# Patient Record
Sex: Male | Born: 1935 | Race: White | Hispanic: No | Marital: Married | Smoking: Never smoker
Health system: Southern US, Community
[De-identification: ages and names within clinical notes are randomized; demographics above are authoritative.]

## PROBLEM LIST (undated history)

## (undated) DIAGNOSIS — C49A0 Gastrointestinal stromal tumor, unspecified site: Secondary | ICD-10-CM

## (undated) DIAGNOSIS — I10 Essential (primary) hypertension: Secondary | ICD-10-CM

## (undated) DIAGNOSIS — R0600 Dyspnea, unspecified: Secondary | ICD-10-CM

## (undated) DIAGNOSIS — N138 Other obstructive and reflux uropathy: Secondary | ICD-10-CM

## (undated) DIAGNOSIS — S98132A Complete traumatic amputation of one left lesser toe, initial encounter: Secondary | ICD-10-CM

## (undated) DIAGNOSIS — N210 Calculus in bladder: Secondary | ICD-10-CM

## (undated) DIAGNOSIS — I739 Peripheral vascular disease, unspecified: Secondary | ICD-10-CM

## (undated) DIAGNOSIS — C8590 Non-Hodgkin lymphoma, unspecified, unspecified site: Principal | ICD-10-CM

## (undated) DIAGNOSIS — I4891 Unspecified atrial fibrillation: Secondary | ICD-10-CM

## (undated) DIAGNOSIS — K219 Gastro-esophageal reflux disease without esophagitis: Secondary | ICD-10-CM

## (undated) DIAGNOSIS — I499 Cardiac arrhythmia, unspecified: Secondary | ICD-10-CM

## (undated) DIAGNOSIS — H269 Unspecified cataract: Secondary | ICD-10-CM

## (undated) DIAGNOSIS — C4490 Unspecified malignant neoplasm of skin, unspecified: Secondary | ICD-10-CM

## (undated) DIAGNOSIS — M1990 Unspecified osteoarthritis, unspecified site: Secondary | ICD-10-CM

## (undated) DIAGNOSIS — Z800 Family history of malignant neoplasm of digestive organs: Secondary | ICD-10-CM

## (undated) DIAGNOSIS — Z803 Family history of malignant neoplasm of breast: Secondary | ICD-10-CM

## (undated) DIAGNOSIS — E119 Type 2 diabetes mellitus without complications: Secondary | ICD-10-CM

## (undated) DIAGNOSIS — N401 Enlarged prostate with lower urinary tract symptoms: Secondary | ICD-10-CM

## (undated) DIAGNOSIS — J209 Acute bronchitis, unspecified: Secondary | ICD-10-CM

## (undated) HISTORY — PX: CHOLECYSTECTOMY: SHX55

## (undated) HISTORY — DX: Non-Hodgkin lymphoma, unspecified, unspecified site: C85.90

## (undated) HISTORY — DX: Unspecified atrial fibrillation: I48.91

## (undated) HISTORY — DX: Complete traumatic amputation of one left lesser toe, initial encounter: S98.132A

## (undated) HISTORY — PX: INCISION AND DRAINAGE PERIRECTAL ABSCESS: SHX1804

## (undated) HISTORY — DX: Other obstructive and reflux uropathy: N40.1

## (undated) HISTORY — DX: Gastrointestinal stromal tumor, unspecified site: C49.A0

## (undated) HISTORY — DX: Type 2 diabetes mellitus without complications: E11.9

## (undated) HISTORY — PX: LYMPH NODE DISSECTION: SHX5087

## (undated) HISTORY — DX: Family history of malignant neoplasm of breast: Z80.3

## (undated) HISTORY — DX: Other obstructive and reflux uropathy: N13.8

## (undated) HISTORY — PX: COLON RESECTION: SHX5231

## (undated) HISTORY — PX: PORTACATH PLACEMENT: SHX2246

## (undated) HISTORY — PX: PORT-A-CATH REMOVAL: SHX5289

## (undated) HISTORY — DX: Family history of malignant neoplasm of digestive organs: Z80.0

---

## 1998-04-20 DIAGNOSIS — R1310 Dysphagia, unspecified: Secondary | ICD-10-CM | POA: Insufficient documentation

## 2001-04-01 ENCOUNTER — Ambulatory Visit (HOSPITAL_COMMUNITY): Admission: RE | Admit: 2001-04-01 | Discharge: 2001-04-01 | Payer: Self-pay | Admitting: Pulmonary Disease

## 2002-01-11 ENCOUNTER — Ambulatory Visit (HOSPITAL_COMMUNITY): Admission: RE | Admit: 2002-01-11 | Discharge: 2002-01-11 | Payer: Self-pay | Admitting: Internal Medicine

## 2002-03-27 ENCOUNTER — Ambulatory Visit (HOSPITAL_COMMUNITY): Admission: RE | Admit: 2002-03-27 | Discharge: 2002-03-27 | Payer: Self-pay | Admitting: Pulmonary Disease

## 2002-12-28 ENCOUNTER — Other Ambulatory Visit: Admission: RE | Admit: 2002-12-28 | Discharge: 2002-12-28 | Payer: Self-pay | Admitting: Dermatology

## 2003-02-12 ENCOUNTER — Encounter: Payer: Self-pay | Admitting: Internal Medicine

## 2003-02-12 ENCOUNTER — Ambulatory Visit (HOSPITAL_COMMUNITY): Admission: RE | Admit: 2003-02-12 | Discharge: 2003-02-12 | Payer: Self-pay | Admitting: Internal Medicine

## 2003-03-30 ENCOUNTER — Ambulatory Visit (HOSPITAL_COMMUNITY): Admission: RE | Admit: 2003-03-30 | Discharge: 2003-03-30 | Payer: Self-pay | Admitting: Internal Medicine

## 2003-04-04 ENCOUNTER — Ambulatory Visit (HOSPITAL_COMMUNITY): Admission: RE | Admit: 2003-04-04 | Discharge: 2003-04-04 | Payer: Self-pay | Admitting: Internal Medicine

## 2003-04-21 DIAGNOSIS — C49A0 Gastrointestinal stromal tumor, unspecified site: Secondary | ICD-10-CM

## 2003-04-21 DIAGNOSIS — C8590 Non-Hodgkin lymphoma, unspecified, unspecified site: Secondary | ICD-10-CM

## 2003-04-21 HISTORY — DX: Gastrointestinal stromal tumor, unspecified site: C49.A0

## 2003-04-21 HISTORY — DX: Non-Hodgkin lymphoma, unspecified, unspecified site: C85.90

## 2003-08-03 ENCOUNTER — Ambulatory Visit (HOSPITAL_COMMUNITY): Admission: RE | Admit: 2003-08-03 | Discharge: 2003-08-03 | Payer: Self-pay | Admitting: General Surgery

## 2003-08-21 ENCOUNTER — Encounter (HOSPITAL_COMMUNITY): Admission: RE | Admit: 2003-08-21 | Discharge: 2003-09-20 | Payer: Self-pay | Admitting: Oncology

## 2003-08-21 ENCOUNTER — Encounter: Admission: RE | Admit: 2003-08-21 | Discharge: 2003-08-21 | Payer: Self-pay | Admitting: Oncology

## 2003-08-27 ENCOUNTER — Ambulatory Visit (HOSPITAL_COMMUNITY): Admission: RE | Admit: 2003-08-27 | Discharge: 2003-08-27 | Payer: Self-pay | Admitting: Oncology

## 2003-09-06 ENCOUNTER — Encounter: Admission: RE | Admit: 2003-09-06 | Discharge: 2003-09-06 | Payer: Self-pay | Admitting: Surgery

## 2003-09-10 ENCOUNTER — Ambulatory Visit (HOSPITAL_BASED_OUTPATIENT_CLINIC_OR_DEPARTMENT_OTHER): Admission: RE | Admit: 2003-09-10 | Discharge: 2003-09-10 | Payer: Self-pay | Admitting: Surgery

## 2003-09-10 ENCOUNTER — Ambulatory Visit (HOSPITAL_COMMUNITY): Admission: RE | Admit: 2003-09-10 | Discharge: 2003-09-10 | Payer: Self-pay | Admitting: Surgery

## 2003-09-24 ENCOUNTER — Encounter (HOSPITAL_COMMUNITY): Admission: RE | Admit: 2003-09-24 | Discharge: 2003-10-24 | Payer: Self-pay | Admitting: Oncology

## 2003-09-24 ENCOUNTER — Encounter: Admission: RE | Admit: 2003-09-24 | Discharge: 2003-09-24 | Payer: Self-pay | Admitting: Oncology

## 2003-10-29 ENCOUNTER — Encounter: Admission: RE | Admit: 2003-10-29 | Discharge: 2003-10-29 | Payer: Self-pay | Admitting: Oncology

## 2003-10-29 ENCOUNTER — Encounter (HOSPITAL_COMMUNITY): Admission: RE | Admit: 2003-10-29 | Discharge: 2003-11-28 | Payer: Self-pay | Admitting: Oncology

## 2003-11-23 ENCOUNTER — Ambulatory Visit (HOSPITAL_COMMUNITY): Admission: RE | Admit: 2003-11-23 | Discharge: 2003-11-23 | Payer: Self-pay | Admitting: Oncology

## 2003-11-30 ENCOUNTER — Encounter (HOSPITAL_COMMUNITY): Admission: RE | Admit: 2003-11-30 | Discharge: 2003-12-30 | Payer: Self-pay | Admitting: Oncology

## 2003-11-30 ENCOUNTER — Encounter: Admission: RE | Admit: 2003-11-30 | Discharge: 2003-11-30 | Payer: Self-pay | Admitting: Oncology

## 2004-01-09 ENCOUNTER — Encounter (HOSPITAL_COMMUNITY): Admission: RE | Admit: 2004-01-09 | Discharge: 2004-01-18 | Payer: Self-pay | Admitting: Oncology

## 2004-01-09 ENCOUNTER — Encounter: Admission: RE | Admit: 2004-01-09 | Discharge: 2004-01-18 | Payer: Self-pay | Admitting: Oncology

## 2004-01-24 ENCOUNTER — Encounter: Admission: RE | Admit: 2004-01-24 | Discharge: 2004-01-24 | Payer: Self-pay | Admitting: Oncology

## 2004-01-24 ENCOUNTER — Encounter (HOSPITAL_COMMUNITY): Admission: RE | Admit: 2004-01-24 | Discharge: 2004-02-23 | Payer: Self-pay | Admitting: Oncology

## 2004-02-14 ENCOUNTER — Inpatient Hospital Stay (HOSPITAL_COMMUNITY): Admission: RE | Admit: 2004-02-14 | Discharge: 2004-02-18 | Payer: Self-pay | Admitting: Surgery

## 2004-02-14 ENCOUNTER — Encounter (INDEPENDENT_AMBULATORY_CARE_PROVIDER_SITE_OTHER): Payer: Self-pay | Admitting: Specialist

## 2004-03-10 ENCOUNTER — Ambulatory Visit (HOSPITAL_COMMUNITY): Payer: Self-pay | Admitting: Oncology

## 2004-03-10 ENCOUNTER — Encounter (HOSPITAL_COMMUNITY): Admission: RE | Admit: 2004-03-10 | Discharge: 2004-04-09 | Payer: Self-pay | Admitting: Oncology

## 2004-03-10 ENCOUNTER — Encounter: Admission: RE | Admit: 2004-03-10 | Discharge: 2004-03-10 | Payer: Self-pay | Admitting: Oncology

## 2004-06-23 ENCOUNTER — Encounter (HOSPITAL_COMMUNITY): Admission: RE | Admit: 2004-06-23 | Discharge: 2004-07-23 | Payer: Self-pay | Admitting: Oncology

## 2004-06-23 ENCOUNTER — Ambulatory Visit (HOSPITAL_COMMUNITY): Payer: Self-pay | Admitting: Oncology

## 2004-06-23 ENCOUNTER — Encounter: Admission: RE | Admit: 2004-06-23 | Discharge: 2004-06-23 | Payer: Self-pay | Admitting: Oncology

## 2004-07-21 ENCOUNTER — Ambulatory Visit (HOSPITAL_COMMUNITY): Admission: RE | Admit: 2004-07-21 | Discharge: 2004-07-21 | Payer: Self-pay | Admitting: Oncology

## 2004-08-04 ENCOUNTER — Encounter: Admission: RE | Admit: 2004-08-04 | Discharge: 2004-08-04 | Payer: Self-pay | Admitting: Oncology

## 2004-08-19 ENCOUNTER — Ambulatory Visit (HOSPITAL_COMMUNITY): Admission: RE | Admit: 2004-08-19 | Discharge: 2004-08-19 | Payer: Self-pay | Admitting: Internal Medicine

## 2004-09-01 ENCOUNTER — Ambulatory Visit (HOSPITAL_COMMUNITY): Payer: Self-pay | Admitting: Oncology

## 2004-10-13 ENCOUNTER — Encounter: Admission: RE | Admit: 2004-10-13 | Discharge: 2004-10-13 | Payer: Self-pay | Admitting: Oncology

## 2004-12-01 ENCOUNTER — Encounter: Admission: RE | Admit: 2004-12-01 | Discharge: 2004-12-01 | Payer: Self-pay | Admitting: Oncology

## 2004-12-01 ENCOUNTER — Ambulatory Visit (HOSPITAL_COMMUNITY): Payer: Self-pay | Admitting: Oncology

## 2005-01-19 ENCOUNTER — Ambulatory Visit (HOSPITAL_COMMUNITY): Payer: Self-pay | Admitting: Oncology

## 2005-01-19 ENCOUNTER — Encounter: Admission: RE | Admit: 2005-01-19 | Discharge: 2005-01-19 | Payer: Self-pay | Admitting: Oncology

## 2005-01-19 ENCOUNTER — Encounter (HOSPITAL_COMMUNITY): Admission: RE | Admit: 2005-01-19 | Discharge: 2005-02-18 | Payer: Self-pay | Admitting: Oncology

## 2005-03-02 ENCOUNTER — Encounter: Admission: RE | Admit: 2005-03-02 | Discharge: 2005-03-02 | Payer: Self-pay | Admitting: Oncology

## 2005-04-15 ENCOUNTER — Encounter: Admission: RE | Admit: 2005-04-15 | Discharge: 2005-04-15 | Payer: Self-pay | Admitting: Oncology

## 2005-04-15 ENCOUNTER — Ambulatory Visit (HOSPITAL_COMMUNITY): Payer: Self-pay | Admitting: Oncology

## 2005-07-20 ENCOUNTER — Ambulatory Visit (HOSPITAL_COMMUNITY): Admission: RE | Admit: 2005-07-20 | Discharge: 2005-07-20 | Payer: Self-pay | Admitting: Oncology

## 2005-07-23 ENCOUNTER — Encounter (HOSPITAL_COMMUNITY): Admission: RE | Admit: 2005-07-23 | Discharge: 2005-08-22 | Payer: Self-pay | Admitting: Oncology

## 2005-07-23 ENCOUNTER — Encounter: Admission: RE | Admit: 2005-07-23 | Discharge: 2005-07-23 | Payer: Self-pay | Admitting: Oncology

## 2005-07-28 ENCOUNTER — Ambulatory Visit (HOSPITAL_COMMUNITY): Payer: Self-pay | Admitting: Oncology

## 2005-09-08 ENCOUNTER — Encounter: Admission: RE | Admit: 2005-09-08 | Discharge: 2005-09-08 | Payer: Self-pay | Admitting: Oncology

## 2005-10-23 ENCOUNTER — Ambulatory Visit (HOSPITAL_COMMUNITY): Payer: Self-pay | Admitting: Oncology

## 2005-10-23 ENCOUNTER — Encounter: Admission: RE | Admit: 2005-10-23 | Discharge: 2005-10-23 | Payer: Self-pay | Admitting: Oncology

## 2005-12-04 ENCOUNTER — Encounter: Admission: RE | Admit: 2005-12-04 | Discharge: 2005-12-04 | Payer: Self-pay | Admitting: Oncology

## 2006-01-20 ENCOUNTER — Encounter (INDEPENDENT_AMBULATORY_CARE_PROVIDER_SITE_OTHER): Payer: Self-pay | Admitting: *Deleted

## 2006-01-20 ENCOUNTER — Ambulatory Visit (HOSPITAL_COMMUNITY): Payer: Self-pay | Admitting: Oncology

## 2006-01-20 ENCOUNTER — Encounter (HOSPITAL_COMMUNITY): Admission: RE | Admit: 2006-01-20 | Discharge: 2006-02-19 | Payer: Self-pay | Admitting: Oncology

## 2006-01-20 ENCOUNTER — Encounter: Admission: RE | Admit: 2006-01-20 | Discharge: 2006-01-20 | Payer: Self-pay | Admitting: Oncology

## 2006-01-20 LAB — CONVERTED CEMR LAB
Cholesterol: 145 mg/dL
HDL: 35 mg/dL
LDL Cholesterol: 66 mg/dL
Triglycerides: 219 mg/dL

## 2006-02-25 ENCOUNTER — Encounter: Admission: RE | Admit: 2006-02-25 | Discharge: 2006-02-25 | Payer: Self-pay | Admitting: Oncology

## 2006-04-08 ENCOUNTER — Ambulatory Visit (HOSPITAL_COMMUNITY): Payer: Self-pay | Admitting: Oncology

## 2006-07-23 ENCOUNTER — Encounter (HOSPITAL_COMMUNITY): Admission: RE | Admit: 2006-07-23 | Discharge: 2006-08-22 | Payer: Self-pay | Admitting: Oncology

## 2006-07-23 ENCOUNTER — Ambulatory Visit (HOSPITAL_COMMUNITY): Payer: Self-pay | Admitting: Oncology

## 2006-07-27 ENCOUNTER — Ambulatory Visit (HOSPITAL_COMMUNITY): Admission: RE | Admit: 2006-07-27 | Discharge: 2006-07-27 | Payer: Self-pay | Admitting: Oncology

## 2006-08-23 ENCOUNTER — Ambulatory Visit: Payer: Self-pay | Admitting: Cardiology

## 2006-08-23 ENCOUNTER — Ambulatory Visit (HOSPITAL_COMMUNITY): Admission: RE | Admit: 2006-08-23 | Discharge: 2006-08-23 | Payer: Self-pay | Admitting: Cardiology

## 2006-08-26 ENCOUNTER — Ambulatory Visit: Payer: Self-pay | Admitting: Cardiology

## 2006-08-30 ENCOUNTER — Ambulatory Visit: Payer: Self-pay | Admitting: Internal Medicine

## 2006-09-06 ENCOUNTER — Ambulatory Visit: Payer: Self-pay | Admitting: Cardiology

## 2006-09-10 ENCOUNTER — Ambulatory Visit: Payer: Self-pay | Admitting: Internal Medicine

## 2006-09-14 ENCOUNTER — Ambulatory Visit: Payer: Self-pay | Admitting: Cardiology

## 2006-09-22 ENCOUNTER — Ambulatory Visit: Payer: Self-pay | Admitting: Cardiology

## 2006-10-15 ENCOUNTER — Ambulatory Visit (HOSPITAL_COMMUNITY): Payer: Self-pay | Admitting: Oncology

## 2006-10-27 ENCOUNTER — Ambulatory Visit: Payer: Self-pay | Admitting: Cardiology

## 2006-11-10 ENCOUNTER — Ambulatory Visit: Payer: Self-pay | Admitting: Cardiology

## 2006-12-08 ENCOUNTER — Ambulatory Visit: Payer: Self-pay | Admitting: Cardiology

## 2006-12-13 ENCOUNTER — Ambulatory Visit: Payer: Self-pay | Admitting: Cardiology

## 2006-12-15 ENCOUNTER — Ambulatory Visit: Payer: Self-pay | Admitting: Cardiology

## 2007-01-07 ENCOUNTER — Ambulatory Visit (HOSPITAL_COMMUNITY): Payer: Self-pay | Admitting: Oncology

## 2007-01-12 ENCOUNTER — Ambulatory Visit: Payer: Self-pay | Admitting: Cardiology

## 2007-01-31 ENCOUNTER — Encounter (HOSPITAL_COMMUNITY): Admission: RE | Admit: 2007-01-31 | Discharge: 2007-03-02 | Payer: Self-pay | Admitting: Oncology

## 2007-02-11 ENCOUNTER — Ambulatory Visit: Payer: Self-pay | Admitting: Cardiology

## 2007-02-25 ENCOUNTER — Ambulatory Visit: Payer: Self-pay | Admitting: Cardiovascular Disease

## 2007-03-25 ENCOUNTER — Ambulatory Visit: Payer: Self-pay | Admitting: Cardiology

## 2007-04-12 ENCOUNTER — Ambulatory Visit: Payer: Self-pay | Admitting: Cardiology

## 2007-04-12 ENCOUNTER — Ambulatory Visit (HOSPITAL_COMMUNITY): Payer: Self-pay | Admitting: Oncology

## 2007-07-22 ENCOUNTER — Ambulatory Visit (HOSPITAL_COMMUNITY): Payer: Self-pay | Admitting: Oncology

## 2007-07-22 ENCOUNTER — Encounter (HOSPITAL_COMMUNITY): Admission: RE | Admit: 2007-07-22 | Discharge: 2007-08-21 | Payer: Self-pay | Admitting: Oncology

## 2007-07-26 ENCOUNTER — Ambulatory Visit (HOSPITAL_COMMUNITY): Admission: RE | Admit: 2007-07-26 | Discharge: 2007-07-26 | Payer: Self-pay | Admitting: Oncology

## 2007-08-05 ENCOUNTER — Ambulatory Visit: Payer: Self-pay | Admitting: Cardiology

## 2007-09-05 ENCOUNTER — Ambulatory Visit: Payer: Self-pay | Admitting: Internal Medicine

## 2007-09-09 ENCOUNTER — Ambulatory Visit: Payer: Self-pay | Admitting: Cardiology

## 2007-10-03 ENCOUNTER — Ambulatory Visit: Payer: Self-pay | Admitting: Cardiology

## 2007-10-14 ENCOUNTER — Ambulatory Visit (HOSPITAL_COMMUNITY): Payer: Self-pay | Admitting: Oncology

## 2007-10-17 ENCOUNTER — Ambulatory Visit: Payer: Self-pay | Admitting: Internal Medicine

## 2007-10-31 ENCOUNTER — Ambulatory Visit: Payer: Self-pay | Admitting: Cardiology

## 2007-11-14 ENCOUNTER — Ambulatory Visit: Payer: Self-pay | Admitting: Cardiology

## 2007-12-12 ENCOUNTER — Ambulatory Visit: Payer: Self-pay | Admitting: Cardiology

## 2008-01-02 ENCOUNTER — Ambulatory Visit: Payer: Self-pay | Admitting: Cardiology

## 2008-01-30 ENCOUNTER — Ambulatory Visit: Payer: Self-pay | Admitting: Cardiology

## 2008-02-15 ENCOUNTER — Encounter (HOSPITAL_COMMUNITY): Admission: RE | Admit: 2008-02-15 | Discharge: 2008-03-16 | Payer: Self-pay | Admitting: Oncology

## 2008-02-22 ENCOUNTER — Ambulatory Visit (HOSPITAL_COMMUNITY): Payer: Self-pay | Admitting: Oncology

## 2008-03-05 ENCOUNTER — Ambulatory Visit: Payer: Self-pay | Admitting: Cardiology

## 2008-03-12 ENCOUNTER — Ambulatory Visit: Payer: Self-pay | Admitting: Cardiology

## 2008-04-05 ENCOUNTER — Ambulatory Visit: Payer: Self-pay | Admitting: Cardiology

## 2008-07-23 ENCOUNTER — Encounter: Payer: Self-pay | Admitting: Cardiology

## 2008-07-23 ENCOUNTER — Ambulatory Visit: Payer: Self-pay | Admitting: Cardiology

## 2008-07-25 ENCOUNTER — Encounter (HOSPITAL_COMMUNITY): Admission: RE | Admit: 2008-07-25 | Discharge: 2008-08-24 | Payer: Self-pay | Admitting: Oncology

## 2008-07-25 ENCOUNTER — Ambulatory Visit (HOSPITAL_COMMUNITY): Payer: Self-pay | Admitting: Oncology

## 2008-07-31 ENCOUNTER — Ambulatory Visit (HOSPITAL_COMMUNITY): Admission: RE | Admit: 2008-07-31 | Discharge: 2008-07-31 | Payer: Self-pay | Admitting: Oncology

## 2008-08-03 ENCOUNTER — Ambulatory Visit: Payer: Self-pay | Admitting: Cardiology

## 2008-08-23 ENCOUNTER — Ambulatory Visit: Payer: Self-pay | Admitting: Cardiology

## 2008-09-10 ENCOUNTER — Telehealth: Payer: Self-pay | Admitting: Cardiology

## 2008-09-19 ENCOUNTER — Ambulatory Visit: Payer: Self-pay | Admitting: Cardiology

## 2008-10-04 DIAGNOSIS — I10 Essential (primary) hypertension: Secondary | ICD-10-CM | POA: Insufficient documentation

## 2008-10-04 DIAGNOSIS — I4891 Unspecified atrial fibrillation: Secondary | ICD-10-CM | POA: Insufficient documentation

## 2008-10-17 ENCOUNTER — Encounter (INDEPENDENT_AMBULATORY_CARE_PROVIDER_SITE_OTHER): Payer: Self-pay | Admitting: *Deleted

## 2008-10-18 ENCOUNTER — Ambulatory Visit (HOSPITAL_COMMUNITY): Payer: Self-pay | Admitting: Oncology

## 2008-10-18 ENCOUNTER — Ambulatory Visit: Payer: Self-pay | Admitting: Cardiology

## 2008-11-22 ENCOUNTER — Ambulatory Visit: Payer: Self-pay

## 2008-11-22 ENCOUNTER — Encounter: Payer: Self-pay | Admitting: Cardiology

## 2008-12-03 ENCOUNTER — Encounter: Payer: Self-pay | Admitting: *Deleted

## 2008-12-20 ENCOUNTER — Ambulatory Visit: Payer: Self-pay | Admitting: Cardiology

## 2008-12-20 LAB — CONVERTED CEMR LAB: POC INR: 2.7

## 2009-01-09 ENCOUNTER — Ambulatory Visit (HOSPITAL_COMMUNITY): Payer: Self-pay | Admitting: Oncology

## 2009-01-21 ENCOUNTER — Ambulatory Visit: Payer: Self-pay | Admitting: Cardiology

## 2009-01-21 LAB — CONVERTED CEMR LAB: POC INR: 3

## 2009-02-11 ENCOUNTER — Encounter (HOSPITAL_COMMUNITY): Admission: RE | Admit: 2009-02-11 | Discharge: 2009-03-13 | Payer: Self-pay | Admitting: Oncology

## 2009-02-18 ENCOUNTER — Telehealth: Payer: Self-pay | Admitting: Cardiology

## 2009-02-18 ENCOUNTER — Ambulatory Visit: Payer: Self-pay | Admitting: Cardiology

## 2009-02-18 LAB — CONVERTED CEMR LAB: POC INR: 2.9

## 2009-02-19 ENCOUNTER — Telehealth: Payer: Self-pay | Admitting: Cardiology

## 2009-03-07 ENCOUNTER — Ambulatory Visit: Payer: Self-pay | Admitting: Cardiology

## 2009-03-07 LAB — CONVERTED CEMR LAB: POC INR: 2.7

## 2009-03-12 ENCOUNTER — Ambulatory Visit (HOSPITAL_COMMUNITY): Admission: RE | Admit: 2009-03-12 | Discharge: 2009-03-12 | Payer: Self-pay | Admitting: Internal Medicine

## 2009-03-25 ENCOUNTER — Encounter: Payer: Self-pay | Admitting: Cardiology

## 2009-03-25 ENCOUNTER — Telehealth: Payer: Self-pay | Admitting: Cardiology

## 2009-04-02 ENCOUNTER — Ambulatory Visit (HOSPITAL_BASED_OUTPATIENT_CLINIC_OR_DEPARTMENT_OTHER): Admission: RE | Admit: 2009-04-02 | Discharge: 2009-04-02 | Payer: Self-pay | Admitting: Surgery

## 2009-04-03 ENCOUNTER — Encounter (INDEPENDENT_AMBULATORY_CARE_PROVIDER_SITE_OTHER): Payer: Self-pay | Admitting: *Deleted

## 2009-04-05 ENCOUNTER — Ambulatory Visit: Payer: Self-pay | Admitting: Cardiology

## 2009-04-08 ENCOUNTER — Ambulatory Visit: Payer: Self-pay | Admitting: Cardiology

## 2009-04-08 LAB — CONVERTED CEMR LAB: POC INR: 2.1

## 2009-04-29 ENCOUNTER — Encounter: Payer: Self-pay | Admitting: Cardiology

## 2009-05-09 ENCOUNTER — Telehealth: Payer: Self-pay | Admitting: Cardiology

## 2009-05-09 ENCOUNTER — Encounter: Payer: Self-pay | Admitting: Cardiology

## 2009-05-09 LAB — CONVERTED CEMR LAB
INR: 3.2
Prothrombin Time: 32.7 s

## 2009-07-22 ENCOUNTER — Encounter (HOSPITAL_COMMUNITY): Admission: RE | Admit: 2009-07-22 | Discharge: 2009-08-21 | Payer: Self-pay | Admitting: Oncology

## 2009-07-22 ENCOUNTER — Ambulatory Visit (HOSPITAL_COMMUNITY): Payer: Self-pay | Admitting: Oncology

## 2009-07-22 ENCOUNTER — Encounter: Payer: Self-pay | Admitting: Cardiology

## 2009-07-22 ENCOUNTER — Ambulatory Visit: Payer: Self-pay | Admitting: Cardiology

## 2009-07-22 LAB — CONVERTED CEMR LAB: POC INR: 5.4

## 2009-07-24 ENCOUNTER — Ambulatory Visit: Payer: Self-pay | Admitting: Cardiology

## 2009-07-24 LAB — CONVERTED CEMR LAB: POC INR: 3

## 2009-07-31 ENCOUNTER — Ambulatory Visit: Payer: Self-pay | Admitting: Cardiology

## 2009-07-31 LAB — CONVERTED CEMR LAB: POC INR: 2.9

## 2009-08-15 ENCOUNTER — Ambulatory Visit: Payer: Self-pay | Admitting: Cardiology

## 2009-08-15 LAB — CONVERTED CEMR LAB: POC INR: 2.4

## 2009-08-27 ENCOUNTER — Encounter: Payer: Self-pay | Admitting: Cardiology

## 2009-09-02 ENCOUNTER — Encounter (HOSPITAL_COMMUNITY): Admission: RE | Admit: 2009-09-02 | Discharge: 2009-10-02 | Payer: Self-pay | Admitting: Oncology

## 2009-09-12 ENCOUNTER — Ambulatory Visit: Payer: Self-pay | Admitting: Cardiology

## 2009-09-12 LAB — CONVERTED CEMR LAB: POC INR: 1.8

## 2009-10-03 ENCOUNTER — Ambulatory Visit (HOSPITAL_COMMUNITY): Admission: RE | Admit: 2009-10-03 | Discharge: 2009-10-03 | Payer: Self-pay | Admitting: Internal Medicine

## 2009-10-14 ENCOUNTER — Ambulatory Visit: Payer: Self-pay | Admitting: Cardiology

## 2009-10-14 LAB — CONVERTED CEMR LAB: POC INR: 2.7

## 2009-10-22 ENCOUNTER — Telehealth (INDEPENDENT_AMBULATORY_CARE_PROVIDER_SITE_OTHER): Payer: Self-pay | Admitting: *Deleted

## 2009-11-06 ENCOUNTER — Ambulatory Visit: Payer: Self-pay | Admitting: Cardiology

## 2009-11-11 ENCOUNTER — Ambulatory Visit: Payer: Self-pay | Admitting: Cardiology

## 2009-11-11 LAB — CONVERTED CEMR LAB: POC INR: 2.2

## 2009-12-09 ENCOUNTER — Ambulatory Visit: Payer: Self-pay | Admitting: Cardiology

## 2009-12-09 LAB — CONVERTED CEMR LAB: POC INR: 2.2

## 2010-01-06 ENCOUNTER — Ambulatory Visit: Payer: Self-pay | Admitting: Cardiology

## 2010-01-06 LAB — CONVERTED CEMR LAB: POC INR: 2.7

## 2010-02-03 ENCOUNTER — Ambulatory Visit: Payer: Self-pay | Admitting: Cardiology

## 2010-02-03 LAB — CONVERTED CEMR LAB: POC INR: 2.2

## 2010-02-12 ENCOUNTER — Ambulatory Visit (HOSPITAL_COMMUNITY): Payer: Self-pay | Admitting: Oncology

## 2010-02-12 ENCOUNTER — Encounter: Payer: Self-pay | Admitting: Cardiology

## 2010-02-12 ENCOUNTER — Encounter (HOSPITAL_COMMUNITY)
Admission: RE | Admit: 2010-02-12 | Discharge: 2010-03-14 | Payer: Self-pay | Source: Home / Self Care | Admitting: Oncology

## 2010-03-03 ENCOUNTER — Ambulatory Visit: Payer: Self-pay | Admitting: Cardiology

## 2010-03-03 LAB — CONVERTED CEMR LAB: POC INR: 2.5

## 2010-04-07 ENCOUNTER — Ambulatory Visit: Payer: Self-pay | Admitting: Cardiology

## 2010-04-07 LAB — CONVERTED CEMR LAB: POC INR: 2

## 2010-05-09 ENCOUNTER — Other Ambulatory Visit (HOSPITAL_COMMUNITY): Payer: Self-pay | Admitting: Oncology

## 2010-05-09 DIAGNOSIS — C8590 Non-Hodgkin lymphoma, unspecified, unspecified site: Secondary | ICD-10-CM

## 2010-05-10 ENCOUNTER — Encounter (HOSPITAL_COMMUNITY): Payer: Self-pay | Admitting: Oncology

## 2010-05-11 ENCOUNTER — Encounter (HOSPITAL_COMMUNITY): Payer: Self-pay | Admitting: Oncology

## 2010-05-20 NOTE — Progress Notes (Signed)
Summary: Question: re. possible dental extraction  Phone Note Call from Patient   Caller: Spouse Reason for Call: Talk to Nurse Summary of Call: Pt's wife states that pt's dentist needs to know how many days to be off coumadin prior to extraction/tg Initial call taken by: Raechel Ache [facility],  February 18, 2009 3:13 PM  Follow-up for Phone Call        Called wife back.  Pt has appt tomorrow to see Dr Laural Benes to find out if tooth can be saved or needs to be pulled.  She will have Dr Laural Benes call us with recommendations if extraction is needed. Follow-up by: Vashti Hey RN,  February 18, 2009 4:31 PM

## 2010-05-20 NOTE — Medication Information (Signed)
Summary: ccr-lr  Anticoagulant Therapy  Managed by: Vashti Hey RN PCP: Dr.[name] Supervising MD: Daleen Squibb MD, Maisie Fus Indication 1: Atrial Fibrillation (ICD-427.31) Lab Used: [facility]  Site: [place] INR POC 2.2  Dietary changes: no    Health status changes: no    Bleeding/hemorrhagic complications: no    Recent/future hospitalizations: no    Any changes in medication regimen? no    Recent/future dental: no  Any missed doses?: no       Is patient compliant with meds? yes       Allergies: No Known Drug Allergies  Anticoagulation Management History:      The patient is taking warfarin and comes in today for a routine follow up visit.  Positive risk factors for bleeding include an age of 75 years or older.  The bleeding index is 'intermediate risk'.  Positive CHADS2 values include History of HTN and Age > 14 years old.  The start date was 08/26/2006.  His last INR was 3.2.  Anticoagulation responsible provider: Daleen Squibb MD, Maisie Fus.  INR POC: 2.2.  Cuvette Lot#: [id number].  Exp: 10/11.    Anticoagulation Management Assessment/Plan:      The patient's current anticoagulation dose is Warfarin sodium 2.5 mg tabs: Take 1 tablet by mouth as directed.  The target INR is 2 - 3.  The next INR is due 01/06/2010.  Anticoagulation instructions were given to patient.  Results were reviewed/authorized by Vashti Hey RN.  He was notified by Vashti Hey RN.         Prior Anticoagulation Instructions: INR 2.2 Continue coumadin 2.5mg  once daily except 5mg  on Thursdays  Current Anticoagulation Instructions: Same as Prior Instructions.

## 2010-05-20 NOTE — Medication Information (Signed)
Summary: ccr-lr  Anticoagulant Therapy  Managed by: Vashti Hey RN PCP: Dr.[name] Supervising MD: Dietrich Pates MD, Molly Maduro Indication 1: Atrial Fibrillation (ICD-427.31) Lab Used: [facility] [address] Site: [place] INR POC 2.7  Dietary changes: no    Health status changes: no    Bleeding/hemorrhagic complications: no    Recent/future hospitalizations: no    Any changes in medication regimen? no    Recent/future dental: no  Any missed doses?: no       Is patient compliant with meds? yes       Allergies: No Known Drug Allergies  Anticoagulation Management History:      The patient is taking warfarin and comes in today for a routine follow up visit.  Positive risk factors for bleeding include an age of 75 years or older.  The bleeding index is 'intermediate risk'.  Positive CHADS2 values include History of HTN and Age > 45 years old.  The start date was 08/26/2006.  His last INR was 3.2.  Anticoagulation responsible provider: Dietrich Pates MD, Molly Maduro.  INR POC: 2.7.  Cuvette Lot#: [id number].  Exp: 10/11.    Anticoagulation Management Assessment/Plan:      The patient's current anticoagulation dose is Warfarin sodium 2.5 mg tabs: Take 1 tablet by mouth as directed.  The target INR is 2 - 3.  The next INR is due 02/03/2010.  Anticoagulation instructions were given to patient.  Results were reviewed/authorized by Vashti Hey RN.  He was notified by Vashti Hey RN.         Prior Anticoagulation Instructions: INR 2.2 Continue coumadin 2.5mg  once daily except 5mg  on Thursdays  Current Anticoagulation Instructions: INR 2.7 Continue coumadin 2.5mg  once daily except 5mg  on Thursdays

## 2010-05-20 NOTE — Medication Information (Signed)
Summary: ccr-lr  Anticoagulant Therapy  Managed by: Vashti Hey RN PCP: Dr.[name] Supervising MD: Dietrich Pates MD, Molly Maduro Indication 1: Atrial Fibrillation (ICD-427.31) Lab Used: [facility] [place] Site: [place] INR POC 2.2  Dietary changes: no    Health status changes: no    Bleeding/hemorrhagic complications: no    Recent/future hospitalizations: no    Any changes in medication regimen? no    Recent/future dental: no  Any missed doses?: no       Is patient compliant with meds? yes       Allergies: No Known Drug Allergies  Anticoagulation Management History:      The patient is taking warfarin and comes in today for a routine follow up visit.  Positive risk factors for bleeding include an age of 75 years or older.  The bleeding index is 'intermediate risk'.  Positive CHADS2 values include History of HTN and Age > 64 years old.  The start date was 08/26/2006.  His last INR was 3.2.  Anticoagulation responsible provider: Dietrich Pates MD, Molly Maduro.  INR POC: 2.2.  Cuvette Lot#: [id number].  Exp: 10/11.    Anticoagulation Management Assessment/Plan:      The patient's current anticoagulation dose is Warfarin sodium 2.5 mg tabs: Take 1 tablet by mouth as directed.  The target INR is 2 - 3.  The next INR is due 12/09/2009.  Anticoagulation instructions were given to patient.  Results were reviewed/authorized by Vashti Hey RN.  He was notified by Vashti Hey RN.         Prior Anticoagulation Instructions: INR 2.7 Continue coumadin 2.5mg  once daily except 5mg  on Thursdays.  Current Anticoagulation Instructions: INR 2.2 Continue coumadin 2.5mg  once daily except 5mg  on Thursdays

## 2010-05-20 NOTE — Progress Notes (Signed)
Summary: Refill-filled in error  Medications Added VERAPAMIL HCL CR 240 MG CR-TABS (VERAPAMIL HCL) Take 1 tablet by mouth once a day WARFARIN SODIUM 2.5 MG TABS (WARFARIN SODIUM) Take 1 tablet by mouth as directed       Phone Note Call from Patient   Caller: Spouse Reason for Call: Refill Medication Summary of Call: Needs refill on Diltiazem CD called to [facility] in [place]/tg Initial call taken by: Raechel Ache,  Sep 10, 2008 9:25 AM    New/Updated Medications: VERAPAMIL HCL CR 240 MG CR-TABS (VERAPAMIL HCL) Take 1 tablet by mouth once a day WARFARIN SODIUM 2.5 MG TABS (WARFARIN SODIUM) Take 1 tablet by mouth as directed   Prescriptions: WARFARIN SODIUM 2.5 MG TABS (WARFARIN SODIUM) Take 1 tablet by mouth as directed  #180 x 4   Entered by:   Teressa Lower RN   Authorized by:   Kathlen Brunswick, MD, [facility]   Signed by:   Teressa Lower RN on 09/10/2008   Method used:   Electronically to        [facility]. [phone]* (retail)       [address] [place], [place]  [address]       Ph: [phone]       Fax: [phone]   RxID:   [id number] VERAPAMIL HCL CR 240 MG CR-TABS (VERAPAMIL HCL) Take 1 tablet by mouth once a day  #30 x 11   Entered by:   Teressa Lower RN   Authorized by:   Kathlen Brunswick, MD, [facility]   Signed by:   Teressa Lower RN on 09/10/2008   Method used:   Electronically to        [facility]. [phone]* (retail)       [address] [place]  [address]       Ph: [phone]       Fax: [phone]   RxID:   [id number]

## 2010-05-20 NOTE — Medication Information (Signed)
Summary: ccr-lr  Anticoagulant Therapy  Managed by: Vashti Hey RN PCP: Dr.[name] Supervising MD: Dietrich Pates MD, Molly Maduro Indication 1: Atrial Fibrillation (ICD-427.31) Lab Used: [facility] [place] Site: [place] INR POC 2.4  Dietary changes: no    Health status changes: no    Bleeding/hemorrhagic complications: no    Recent/future hospitalizations: no    Any changes in medication regimen? no    Recent/future dental: no  Any missed doses?: no       Is patient compliant with meds? yes       Allergies: No Known Drug Allergies  Anticoagulation Management History:      The patient is taking warfarin and comes in today for a routine follow up visit.  Positive risk factors for bleeding include an age of 75 years or older.  The bleeding index is 'intermediate risk'.  Positive CHADS2 values include History of HTN and Age > 49 years old.  The start date was 08/26/2006.  His last INR was 3.2.  Anticoagulation responsible provider: Dietrich Pates MD, Molly Maduro.  INR POC: 2.4.  Cuvette Lot#: [id number].  Exp: 10/11.    Anticoagulation Management Assessment/Plan:      The patient's current anticoagulation dose is Warfarin sodium 2.5 mg tabs: Take 1 tablet by mouth as directed, Warfarin sodium 2.5 mg tabs: take as directed per coumadin clinic.  The target INR is 2 - 3.  The next INR is due 09/12/2009.  Anticoagulation instructions were given to patient.  Results were reviewed/authorized by Vashti Hey RN.  He was notified by Vashti Hey RN.         Prior Anticoagulation Instructions: INR 2.9 Continue coumadin 2.5mg  once daily   Current Anticoagulation Instructions: INR 2.4 Continue coumadin 2.5mg  once daily

## 2010-05-20 NOTE — Progress Notes (Signed)
Summary: coumadin management  Phone Note Outgoing Call   Call placed by: Vashti Hey RN Call placed to: Patient Reason for Call: Discuss lab or test results Summary of Call: Received fax from [place] with PT/INR results obtained on pt 05/06/09.  PT 32.7  INR 3.2   Pt told to hold coumadin tonight then resume 2.5mg  once daily except 5mg  on M,W,F and repeat INR in 1 month and fax results to office.      Anticoagulation Management History:      His anticoagulation is being managed by telephone today.  Positive risk factors for bleeding include an age of 3 years or older.  The bleeding index is 'intermediate risk'.  Positive CHADS2 values include History of HTN and Age > 14 years old.  The start date was 08/26/2006.  Today's INR is 3.2.  Prothrombin time is 32.7.  Anticoagulation responsible provider: Dietrich Pates MD, Molly Maduro.  Exp: 10/11.    Anticoagulation Management Assessment/Plan:      The patient's current anticoagulation dose is Warfarin sodium 2.5 mg tabs: Take 1 tablet by mouth as directed, Warfarin sodium 2.5 mg tabs: take as directed per coumadin clinic.  The target INR is 2 - 3.  The next INR is due 06/10/2009.  Anticoagulation instructions were given to patient.  Results were reviewed/authorized by Vashti Hey RN.  He was notified by Vashti Hey RN.        Coagulation management information includes: Pt cell # [id number].  Prior Anticoagulation Instructions: INR 2.1 Continue coumadin 2.5mg  once daily except 5mg  on Mondays, Wednesdays and Fridays Pt is leaving for [place] on 04/17/09.  He will be there until April. Pt will call when he gets back for F/U appt.  Current Anticoagulation Instructions: Received fax from [place] with PT/INR results obtained on pt 05/06/09.  PT 32.7  INR 3.2   Pt told to hold coumadin tonight then resume 2.5mg  once daily except 5mg  on M,W,F and repeat INR in 1 month and fax results to office.

## 2010-05-20 NOTE — Assessment & Plan Note (Signed)
Summary: REM   Visit Type:  Follow-up Primary Provider:  Dr.[name]   History of Present Illness: [name] comes in today for followup of his atrial fibrillation and had coagulation.  He just had a Port-A-Cath removed this week. He started back on his Coumadin 3 days ago. Her protime performed early next week.  He's had no palpitations, syncope, dyspnea on exertion, or edema. He reports no bleeding other than some slight oozing from the Port-A-Cath site.  Current Medications (verified): 1)  Warfarin Sodium 2.5 Mg Tabs (Warfarin Sodium) .... Take 1 Tablet By Mouth As Directed 2)  Diltiazem Hcl Er Beads 180 Mg Xr24h-Cap (Diltiazem Hcl Er Beads) .... Take One Capsule By Mouth Daily 3)  Prilosec 20 Mg Cpdr (Omeprazole) .... Take 1 Tab Daily 4)  Klor-Con 10 10 Meq Cr-Tabs (Potassium Chloride) .... Take 1 Tab Daily 5)  Lisinopril-Hydrochlorothiazide 20-12.5 Mg Tabs (Lisinopril-Hydrochlorothiazide) .... Take 1 Tab Daily 6)  Amitriptyline Hcl 50 Mg Tabs (Amitriptyline Hcl) .... Patient Taking  1/4 of Tab Which Equals 12.5mg  Daily 7)  Warfarin Sodium 2.5 Mg Tabs (Warfarin Sodium) .... Take As Directed Per Coumadin Clinic 8)  Septra 400-80 Mg Tabs (Sulfamethoxazole-Trimethoprim) .... Take 1 Tab Two Times A Day  Allergies (verified): No Known Drug Allergies  Review of Systems       negative other than history of present illness  Vital Signs:  Patient profile:   77 year old male Height:      70 inches Weight:      203 pounds BMI:     29.23 Pulse rate:   78 / minute BP sitting:   146 / 96  (right arm)  Vitals Entered By: Dreama Saa, CNA (April 05, 2009 10:25 AM)  Physical Exam  General:  Well developed, well nourished, in no acute distress. Head:  normocephalic and atraumatic Eyes:  PERRLA/EOM intact; conjunctiva and lids normal. Neck:  Neck supple, no JVD. No masses, thyromegaly or abnormal cervical nodes. Lungs:  Clear bilaterally to auscultation and percussion. Heart:   irregular rate and rhythm, variable S1-S2, soft systolic murmur left lower sternal border Msk:  Back normal, normal gait. Muscle strength and tone normal. Pulses:  pulses normal in all 4 extremities Extremities:  No clubbing or cyanosis. Neurologic:  Alert and oriented x 3. Skin:  Intact without lesions or rashes. Psych:  Normal affect.   Impression & Recommendations:  Problem # 1:  ATRIAL FIBRILLATION (ICD-427.31) Assessment Unchanged  His updated medication list for this problem includes:    Warfarin Sodium 2.5 Mg Tabs (Warfarin sodium) .Marland Kitchen... Take 1 tablet by mouth as directed    Warfarin Sodium 2.5 Mg Tabs (Warfarin sodium) .Marland Kitchen... Take as directed per coumadin clinic He just restarted his Coumadin 3 days ago. He is scheduled to come for a pro time early next week.  Problem # 2:  HYPERTENSION, UNSPECIFIED (ICD-401.9) Assessment: Unchanged  The following medications were removed from the medication list:    Verapamil Hcl Cr 240 Mg Cr-tabs (Verapamil hcl) .Marland Kitchen... Take 1 tablet by mouth once a day His updated medication list for this problem includes:    Diltiazem Hcl Er Beads 180 Mg Xr24h-cap (Diltiazem hcl er beads) .Marland Kitchen... Take one capsule by mouth daily    Lisinopril-hydrochlorothiazide 20-12.5 Mg Tabs (Lisinopril-hydrochlorothiazide) .Marland Kitchen... Take 1 tab daily

## 2010-05-20 NOTE — Miscellaneous (Signed)
Summary: labs lipid,01/20/2006  Clinical Lists Changes  Observations: Added new observation of LDL: 66 mg/dL (28/41/3244 0:10) Added new observation of HDL: 35 mg/dL (27/25/3664 4:03) Added new observation of TRIGLYC TOT: 219 mg/dL (47/42/5956 3:87) Added new observation of CHOLESTEROL: 145 mg/dL (56/43/3295 1:88)

## 2010-05-20 NOTE — Medication Information (Signed)
Summary: ccr-4 wk-lr  Anticoagulant Therapy  Managed by: Inactive PCP: Dr.[name] Supervising MD: Dietrich Pates MD, Robert Indication 1: Atrial Fibrillation (ICD-427.31) Lab Used: [facility] [place] Site: [place] INR POC 2.1   Health status changes: no     Recent/future hospitalizations: yes       Details: took port a cath out last tuesday  Any changes in medication regimen? no    Recent/future dental: no  Any missed doses?: yes     Details: was off coumadin x 4 days  Is patient compliant with meds? yes       Allergies: No Known Drug Allergies  Anticoagulation Management History:      The patient is taking warfarin and comes in today for a routine follow up visit.  Positive risk factors for bleeding include an age of 75 years or older.  The bleeding index is 'intermediate risk'.  Positive CHADS2 values include History of HTN and Age > 48 years old.  The start date was 08/26/2006.  Anticoagulation responsible provider: Dietrich Pates MD, Molly Maduro.  INR POC: 2.1.  Cuvette Lot#: [id number].  Exp: 10/11.    Anticoagulation Management Assessment/Plan:      The patient's current anticoagulation dose is Warfarin sodium 2.5 mg tabs: Take 1 tablet by mouth as directed, Warfarin sodium 2.5 mg tabs: take as directed per coumadin clinic.  The target INR is 2 - 3.  The next INR is due 04/08/2009.  Anticoagulation instructions were given to patient.  Results were reviewed/authorized by Inactive.  He was notified by Vashti Hey RN.         Prior Anticoagulation Instructions: INR 2.7 Continue coumadin 2.5mg  once daily except 5mg  on Mondays, Wednesdays and Fridays  Current Anticoagulation Instructions: INR 2.1 Continue coumadin 2.5mg  once daily except 5mg  on Mondays, Wednesdays and Fridays Pt is leaving for [place] on 04/17/09.  He will be there until April. Pt will call when he gets back for F/U appt.

## 2010-05-20 NOTE — Medication Information (Signed)
Summary: ccr-lr  Anticoagulant Therapy  Managed by: Vashti Hey RN PCP: Dr.[name] Supervising MD: Dietrich Pates MD, Molly Maduro Indication 1: Atrial Fibrillation (ICD-427.31) Lab Used: [facility] [place] Site: [place] INR POC 2.5  Dietary changes: no    Health status changes: no    Bleeding/hemorrhagic complications: no    Recent/future hospitalizations: no    Any changes in medication regimen? no    Recent/future dental: no  Any missed doses?: no       Is patient compliant with meds? yes       Allergies: No Known Drug Allergies  Anticoagulation Management History:      The patient is taking warfarin and comes in today for a routine follow up visit.  Positive risk factors for bleeding include an age of 50 years or older.  The bleeding index is 'intermediate risk'.  Positive CHADS2 values include History of HTN and Age > 48 years old.  The start date was 08/26/2006.  His last INR was 3.2.  Anticoagulation responsible provider: Dietrich Pates MD, Molly Maduro.  INR POC: 2.5.  Cuvette Lot#: [id number].  Exp: 10/11.    Anticoagulation Management Assessment/Plan:      The patient's current anticoagulation dose is Warfarin sodium 2.5 mg tabs: Take 1 tablet by mouth as directed.  The target INR is 2 - 3.  The next INR is due 04/07/2010.  Anticoagulation instructions were given to patient.  Results were reviewed/authorized by Vashti Hey RN.  He was notified by Vashti Hey RN.         Prior Anticoagulation Instructions: INR 2.2 Continue coumadin 2.5mg  once daily except 5mg  on Thursdays  Current Anticoagulation Instructions: INR 2.5 Continue coumadin 2.5mg  once daily except 5mg  on Thursdays

## 2010-05-20 NOTE — Consult Note (Signed)
Summary: Consultation Report  Consultation Report   Imported By: Marylou Mccoy 04/30/2009 16:16:10  _____________________________________________________________________  External Attachment:    Type:   Image     Comment:   External Document

## 2010-05-20 NOTE — Medication Information (Signed)
Summary: [facility] Patient Summary  [facility] Patient Summary   Imported By: Roderic Ovens 01/15/2009 14:47:40  _____________________________________________________________________  External Attachment:    Type:   Image     Comment:   External Document

## 2010-05-20 NOTE — Miscellaneous (Signed)
  Clinical Lists Changes  Medications: Added new medication of DILTIAZEM HCL ER BEADS 180 MG XR24H-CAP (DILTIAZEM HCL ER BEADS) Take one capsule by mouth daily - Signed Rx of DILTIAZEM HCL ER BEADS 180 MG XR24H-CAP (DILTIAZEM HCL ER BEADS) Take one capsule by mouth daily;  #30 x 11;  Signed;  Entered by: Dreama Saa, CNA;  Authorized by: Gaylord Shih, MD, [facility];  Method used: Electronically to [facility] [address], [place], [place], [place]  [address], Ph: [phone], Fax: [phone]    Prescriptions: DILTIAZEM HCL ER BEADS 180 MG XR24H-CAP (DILTIAZEM HCL ER BEADS) Take one capsule by mouth daily  #30 x 11   Entered by:   Dreama Saa, CNA   Authorized by:   Gaylord Shih, MD, [facility]   Signed by:   Dreama Saa, CNA on 10/17/2008   Method used:   Electronically to        [facility]  [address] (retail)       [address]       [place], [place]  [address]       Ph: [phone]       Fax: [phone]   RxID:   [id number]

## 2010-05-20 NOTE — Medication Information (Signed)
Summary: 4 WK PROTIME PER CHECKOUT ON 8/5/TG  Anticoagulant Therapy  Managed by: Vashti Hey, RN Supervising MD: Dietrich Pates MD, Molly Maduro Indication 1: Atrial Fibrillation (ICD-427.31) Lab Used: [facility] [place] Site: [place] INR POC 2.7  Dietary changes: no    Health status changes: no    Bleeding/hemorrhagic complications: no    Recent/future hospitalizations: no    Any changes in medication regimen? no    Recent/future dental: no  Any missed doses?: no       Is patient compliant with meds? yes       Anticoagulation Management History:      The patient is taking warfarin and comes in today for a routine follow up visit.  Positive risk factors for bleeding include an age of 75 years or older.  The bleeding index is 'intermediate risk'.  Positive CHADS2 values include History of HTN and Age > 75 years old.  The start date was 08/26/2006.  Anticoagulation responsible Ginette Bradway: Dietrich Pates MD, Molly Maduro.  INR POC: 2.7.  Cuvette Lot#: [id number].  Exp: 10/11.    Anticoagulation Management Assessment/Plan:      The patient's current anticoagulation dose is Warfarin sodium 2.5 mg tabs: Take 1 tablet by mouth as directed.  The target INR is 2 - 3.  The next INR is due 01/21/2009.  Anticoagulation instructions were given to patient.  Results were reviewed/authorized by Vashti Hey, RN.  He was notified by Vashti Hey RN.         Prior Anticoagulation Instructions: 2.5mg  qd/5mg  M,W,F  Current Anticoagulation Instructions: INR 2.7 today Continue coumadin 2.5mg  once daily except 5mg  on M,W,F

## 2010-05-20 NOTE — Miscellaneous (Signed)
Summary: Orders Update  Clinical Lists Changes  Orders: Added new Test order of T-Protime, Auto ([id number]) - Signed 

## 2010-05-20 NOTE — Medication Information (Signed)
Summary: ccr-lr  Anticoagulant Therapy  Managed by: Vashti Hey, RN Supervising MD: Dietrich Pates MD, Molly Maduro Indication 1: Atrial Fibrillation (ICD-427.31) Lab Used: [facility] [place] Site:  INR POC 2.7  Dietary changes: no    Health status changes: no    Bleeding/hemorrhagic complications: no    Recent/future hospitalizations: no    Any changes in medication regimen? no    Recent/future dental: yes     Details: Dental extractions on 11/9   was off coumadin 2 days before and restarted 1 day after  Any missed doses?: no       Is patient compliant with meds? yes       Anticoagulation Management History:      The patient is taking warfarin and comes in today for a routine follow up visit.  Positive risk factors for bleeding include an age of 75 years or older.  The bleeding index is 'intermediate risk'.  Positive CHADS2 values include History of HTN and Age > 75 years old.  The start date was 08/26/2006.  Anticoagulation responsible Krystine Pabst: Dietrich Pates MD, Molly Maduro.  INR POC: 2.7.  Cuvette Lot#: [id number].  Exp: 10/11.    Anticoagulation Management Assessment/Plan:      The patient's current anticoagulation dose is Warfarin sodium 2.5 mg tabs: Take 1 tablet by mouth as directed.  The target INR is 2 - 3.  The next INR is due 04/08/2009.  Anticoagulation instructions were given to patient.  Results were reviewed/authorized by Vashti Hey, RN.  He was notified by Vashti Hey RN.         Prior Anticoagulation Instructions: INR 2.9 today Continue coumadin 2.5mg  once daily except 5mg  on Mondays and Fridays  Current Anticoagulation Instructions: INR 2.7 Continue coumadin 2.5mg  once daily except 5mg  on Mondays, Wednesdays and Fridays

## 2010-05-20 NOTE — Medication Information (Signed)
Summary: ccr-lr  Anticoagulant Therapy  Managed by: Vashti Hey, RN Supervising MD: Dietrich Pates MD, Molly Maduro Indication 1: Atrial Fibrillation (ICD-427.31) Lab Used: [facility] [place] Site: [place] INR POC 2.9  Dietary changes: no    Health status changes: no    Bleeding/hemorrhagic complications: no    Recent/future hospitalizations: no    Any changes in medication regimen? no    Recent/future dental: yes     Details: needs dental extraction   not scheduled yet  Any missed doses?: no       Is patient compliant with meds? yes       Anticoagulation Management History:      The patient is taking warfarin and comes in today for a routine follow up visit.  Positive risk factors for bleeding include an age of 75 years or older.  The bleeding index is 'intermediate risk'.  Positive CHADS2 values include History of HTN and Age > 51 years old.  The start date was 08/26/2006.  Anticoagulation responsible Safiyah Cisney: Dietrich Pates MD, Molly Maduro.  INR POC: 2.9.  Cuvette Lot#: [id number].  Exp: 10/11.    Anticoagulation Management Assessment/Plan:      The patient's current anticoagulation dose is Warfarin sodium 2.5 mg tabs: Take 1 tablet by mouth as directed.  The target INR is 2 - 3.  The next INR is due 03/18/2009.  Anticoagulation instructions were given to patient.  Results were reviewed/authorized by Vashti Hey, RN.  He was notified by Vashti Hey RN.         Prior Anticoagulation Instructions: INR 3.0 today Continue coumadin 2.5mg  once daily except 5mg  on Mondays, Wednesdays and Fridays  Current Anticoagulation Instructions: INR 2.9 today Continue coumadin 2.5mg  once daily except 5mg  on Mondays and Fridays

## 2010-05-20 NOTE — Medication Information (Signed)
Summary: ccr-lr  Anticoagulant Therapy  Managed by: Vashti Hey, RN Supervising MD: Dietrich Pates MD, Molly Maduro Indication 1: Atrial Fibrillation (ICD-427.31) Lab Used: [facility] [place] Site: [place] INR POC 3.0  Dietary changes: no    Health status changes: no    Bleeding/hemorrhagic complications: no    Recent/future hospitalizations: no    Any changes in medication regimen? no    Recent/future dental: no  Any missed doses?: no       Is patient compliant with meds? yes       Anticoagulation Management History:      The patient is taking warfarin and comes in today for a routine follow up visit.  Positive risk factors for bleeding include an age of 75 years or older.  The bleeding index is 'intermediate risk'.  Positive CHADS2 values include History of HTN and Age > 75 years old.  The start date was 08/26/2006.  Anticoagulation responsible provider: Dietrich Pates MD, Molly Maduro.  INR POC: 3.0.  Cuvette Lot#: [id number].  Exp: 10/11.    Anticoagulation Management Assessment/Plan:      The patient's current anticoagulation dose is Warfarin sodium 2.5 mg tabs: Take 1 tablet by mouth as directed.  The target INR is 2 - 3.  The next INR is due 02/18/2009.  Anticoagulation instructions were given to patient.  Results were reviewed/authorized by Vashti Hey, RN.  He was notified by Vashti Hey RN.         Prior Anticoagulation Instructions: INR 2.7 today Continue coumadin 2.5mg  once daily except 5mg  on M,W,F  Current Anticoagulation Instructions: INR 3.0 today Continue coumadin 2.5mg  once daily except 5mg  on Mondays, Wednesdays and Fridays

## 2010-05-20 NOTE — Medication Information (Signed)
Summary: ccr-lr  Anticoagulant Therapy  Managed by: Vashti Hey RN PCP: Dr.[name] Supervising MD: Diona Browner MD, Remi Deter Indication 1: Atrial Fibrillation (ICD-427.31) Lab Used: [facility] [place] Site: [place] INR POC 3.0  Dietary changes: no    Health status changes: no    Bleeding/hemorrhagic complications: no    Recent/future hospitalizations: no    Any changes in medication regimen? no    Recent/future dental: no  Any missed doses?: no       Is patient compliant with meds? yes       Allergies: No Known Drug Allergies  Anticoagulation Management History:      The patient is taking warfarin and comes in today for a routine follow up visit.  Positive risk factors for bleeding include an age of 75 years or older.  The bleeding index is 'intermediate risk'.  Positive CHADS2 values include History of HTN and Age > 89 years old.  The start date was 08/26/2006.  His last INR was 3.2.  Anticoagulation responsible provider: Diona Browner MD, Remi Deter.  INR POC: 3.0.  Cuvette Lot#: [id number].  Exp: 10/11.    Anticoagulation Management Assessment/Plan:      The patient's current anticoagulation dose is Warfarin sodium 2.5 mg tabs: Take 1 tablet by mouth as directed, Warfarin sodium 2.5 mg tabs: take as directed per coumadin clinic.  The target INR is 2 - 3.  The next INR is due 07/31/2009.  Anticoagulation instructions were given to patient.  Results were reviewed/authorized by Vashti Hey RN.  He was notified by Vashti Hey RN.         Prior Anticoagulation Instructions: INR 5.4 Hold coumadin tonight and tomorrow night.  Recheck INR Wed. 07/24/09  Current Anticoagulation Instructions: INR 3.0 Restart coumadin at 2.5mg  once daily

## 2010-05-20 NOTE — Assessment & Plan Note (Signed)
Summary: 6 mth f/u per checkout on 04/05/09/tg   Visit Type:  Follow-up Primary Provider:  Dr.[name]   History of Present Illness: [name] returns for evaluation management chronic atrial fibrillation, hypertension, and anticoagulation.  He is having no complaints including no chest pain, angina, palpitations, presyncope, or syncope. He is very compliant with his medications. He has been on and off antibiotics for prostate issues. He is wife are not aware of the fact that this can interact with Coumadin. I told him every time he goes on antibiotic to let our Coumadin clinic know.  He reports no bleeding.  Preventive Screening-Counseling & Management  Alcohol-Tobacco     Smoking Status: never  Current Medications (verified): 1)  Warfarin Sodium 2.5 Mg Tabs (Warfarin Sodium) .... Take 1 Tablet By Mouth As Directed 2)  Diltiazem Hcl Er Beads 180 Mg Xr24h-Cap (Diltiazem Hcl Er Beads) .... Take One Capsule By Mouth Daily 3)  Prilosec 20 Mg Cpdr (Omeprazole) .... Take 1 Tab Daily 4)  Klor-Con 10 10 Meq Cr-Tabs (Potassium Chloride) .... Take 1 Tab Daily 5)  Lisinopril-Hydrochlorothiazide 20-12.5 Mg Tabs (Lisinopril-Hydrochlorothiazide) .... Take 1 Tab Daily 6)  Amitriptyline Hcl 50 Mg Tabs (Amitriptyline Hcl) .... Patient Taking  1/4 of Tab Which Equals 12.5mg  Daily 7)  Warfarin Sodium 2.5 Mg Tabs (Warfarin Sodium) .... Take As Directed Per Coumadin Clinic 8)  Ciprofloxacin Hcl 500 Mg Tabs (Ciprofloxacin Hcl) .... Take 1 Tablet Once Daily 9)  Fenofibrate Micronized 134 Mg Caps (Fenofibrate Micronized) .... Take 1 Tablet Once Daily 10)  Finasteride 5 Mg Tabs (Finasteride) .... Take 1 Tablet Once Daily  Allergies (verified): No Known Drug Allergies  Comments:  Nurse/Medical Assistant: The patient is currently on medications but does not know the name or dosage at this time. Instructed to contact our office with details. Will update medication list at that time.  Past History:  Past  Medical History: Last updated: 10/04/2008 HYPERTENSION, UNSPECIFIED (ICD-401.9) ATRIAL FIBRILLATION (ICD-427.31)    Past Surgical History: Last updated: 10/04/2008 Cholecystectomy I&D of perirectal abscess.  Social History: Last updated: 10/04/2008 Retired Medical illustrator Married  Tobacco Use - No.  Alcohol Use - no Drug Use - no  Risk Factors: Smoking Status: never (11/06/2009)  Review of Systems       negative other than history of present illness  Vital Signs:  Patient profile:   77 year old male Height:      70 inches Weight:      204 pounds Pulse rate:   79 / minute BP sitting:   132 / 90  (left arm) Cuff size:   large  Vitals Entered By: Carlye Grippe (November 06, 2009 9:32 AM)   Physical Exam  General:  Well developed, well nourished, in no acute distress. Head:  normocephalic and atraumatic Eyes:  PERRLA/EOM intact; conjunctiva and lids normal. Neck:  Neck supple, no JVD. No masses, thyromegaly or abnormal cervical nodes. Chest Vicky Mccanless:  no deformities or breast masses noted Lungs:  Clear bilaterally to auscultation and percussion. Heart:  PMI nondisplaced, regular rate and rhythm, S1-S2 are variable. Msk:  decreased ROM.   Pulses:  pulses normal in all 4 extremities Extremities:  trace left pedal edema and trace right pedal edema.   Neurologic:  Alert and oriented x 3. Skin:  Intact without lesions or rashes. Psych:  Normal affect.   Impression & Recommendations:  Problem # 1:  ATRIAL FIBRILLATION (ICD-427.31) Assessment Unchanged  His updated medication list for this problem includes:    Warfarin Sodium  2.5 Mg Tabs (Warfarin sodium) .Marland Kitchen... Take 1 tablet by mouth as directed    Warfarin Sodium 2.5 Mg Tabs (Warfarin sodium) .Marland Kitchen... Take as directed per coumadin clinic  Orders: EKG w/ Interpretation (93000)  Problem # 2:  HYPERTENSION, UNSPECIFIED (ICD-401.9) Assessment: Unchanged  His updated medication list for this problem includes:    Diltiazem Hcl  Er Beads 180 Mg Xr24h-cap (Diltiazem hcl er beads) .Marland Kitchen... Take one capsule by mouth daily    Lisinopril-hydrochlorothiazide 20-12.5 Mg Tabs (Lisinopril-hydrochlorothiazide) .Marland Kitchen... Take 1 tab daily  Problem # 3:  COUMADIN THERAPY (ICD-V58.61) Assessment: Unchanged Note concerns about concomitant antibiotics.  Patient Instructions: 1)  Your physician recommends that you schedule a follow-up appointment in: 6 months 2)  Your physician recommends that you continue on your current medications as directed. Please refer to the Current Medication list given to you today.

## 2010-05-20 NOTE — Letter (Signed)
Summary: [facility] Progress Note   [facility] Progress Note   Imported By: Roderic Ovens 03/27/2010 16:20:39  _____________________________________________________________________  External Attachment:    Type:   Image     Comment:   External Document

## 2010-05-20 NOTE — Progress Notes (Signed)
Summary: NEEDS RX CALLED IN TODAY IS OUT  Phone Note Call from Patient Call back at Home Phone [phone]   Caller: PT Reason for Call: Refill Medication Summary of Call: PER PT WALK IN NEEDS DILTIAZEM 180/24 CAP CALLED IN TO [facility] [phone]. PT STATES THAT HE HAS BEEN CALLING TO GET FILLED BUT EVERY TIME HE GOES TO PICK UP IT IS NOT THERE. [facility] HAS ALREADY GIVEN HIM 3 EXTRA PILLS TO HOLD OVER TILL TODAY. Initial call taken by: Faythe Ghee,  October 22, 2009 9:42 AM    Prescriptions: DILTIAZEM HCL ER BEADS 180 MG XR24H-CAP (DILTIAZEM HCL ER BEADS) Take one capsule by mouth daily  #30 x 2   Entered by:   Teressa Lower RN   Authorized by:   Gaylord Shih, MD, [facility]   Signed by:   Teressa Lower RN on 10/22/2009   Method used:   Electronically to        [facility]  [address] (retail)       [address]       [place], [place]  [address]       Ph: [phone]       Fax: [phone]   RxID:   [id number]

## 2010-05-20 NOTE — Progress Notes (Signed)
Summary: stop coumadin  Phone Note From Other Clinic   Caller: Nurse Reason for Call: Need Referral Information Summary of Call: per [facility] [phone] fax [phone] pt needs to stop coumadin to have port a cath removed. when can he stop? Initial call taken by: Edman Circle,  March 25, 2009 3:33 PM  Follow-up for Phone Call        OK to stop for 5 days then restart.  Reviewed Juanito Doom, MD  Follow-up by: Gaylord Shih, MD, [facility],  March 26, 2009 10:06 AM     Appended Document: stop coumadin FAXED MESSAGE TO [facility]   Appended Document: stop coumadin LMTCB./CY  Appended Document: stop coumadin PT AWARE./CY

## 2010-05-20 NOTE — Medication Information (Signed)
Summary: ccr-lr  Anticoagulant Therapy  Managed by: Vashti Hey RN PCP: Dr.[name] Supervising MD: Dietrich Pates MD, Molly Maduro Indication 1: Atrial Fibrillation (ICD-427.31) Lab Used: [facility]  Site: [place] INR POC 2.7  Dietary changes: no    Health status changes: yes       Details: has bronchitis  Bleeding/hemorrhagic complications: no    Recent/future hospitalizations: no    Any changes in medication regimen? yes       Details: on cipro  started Saturday  to take x 10 days  Recent/future dental: no  Any missed doses?: no       Is patient compliant with meds? yes       Allergies: No Known Drug Allergies  Anticoagulation Management History:      The patient is taking warfarin and comes in today for a routine follow up visit.  Positive risk factors for bleeding include an age of 75 years or older.  The bleeding index is 'intermediate risk'.  Positive CHADS2 values include History of HTN and Age > 26 years old.  The start date was 08/26/2006.  His last INR was 3.2.  Anticoagulation responsible provider: Dietrich Pates MD, Molly Maduro.  INR POC: 2.7.  Cuvette Lot#: [id number].  Exp: 10/11.    Anticoagulation Management Assessment/Plan:      The patient's current anticoagulation dose is Warfarin sodium 2.5 mg tabs: Take 1 tablet by mouth as directed, Warfarin sodium 2.5 mg tabs: take as directed per coumadin clinic.  The target INR is 2 - 3.  The next INR is due 11/11/2009.  Anticoagulation instructions were given to patient.  Results were reviewed/authorized by Vashti Hey RN.  He was notified by Vashti Hey RN.         Prior Anticoagulation Instructions: INR 1.8 Increase coumadin to 2.5mg  once daily except 5mg  on Thursdays  Current Anticoagulation Instructions: INR 2.7 Continue coumadin 2.5mg  once daily except 5mg  on Thursdays.

## 2010-05-20 NOTE — Medication Information (Signed)
Summary: ccr-lr  Anticoagulant Therapy  Managed by: Vashti Hey RN PCP: Dr.[name] Supervising MD: Diona Browner MD, Remi Deter Indication 1: Atrial Fibrillation (ICD-427.31) Lab Used: [facility]  Site: [place] INR POC 1.8  Dietary changes: no    Health status changes: no    Bleeding/hemorrhagic complications: no    Recent/future hospitalizations: no    Any changes in medication regimen? no    Recent/future dental: no  Any missed doses?: no       Is patient compliant with meds? yes       Allergies: No Known Drug Allergies  Anticoagulation Management History:      The patient is taking warfarin and comes in today for a routine follow up visit.  Positive risk factors for bleeding include an age of 74 years or older.  The bleeding index is 'intermediate risk'.  Positive CHADS2 values include History of HTN and Age > 62 years old.  The start date was 08/26/2006.  His last INR was 3.2.  Anticoagulation responsible provider: Diona Browner MD, Remi Deter.  INR POC: 1.8.  Cuvette Lot#: [id number].  Exp: 10/11.    Anticoagulation Management Assessment/Plan:      The patient's current anticoagulation dose is Warfarin sodium 2.5 mg tabs: Take 1 tablet by mouth as directed, Warfarin sodium 2.5 mg tabs: take as directed per coumadin clinic.  The target INR is 2 - 3.  The next INR is due 10/14/2009.  Anticoagulation instructions were given to patient.  Results were reviewed/authorized by Vashti Hey RN.  He was notified by Vashti Hey RN.         Prior Anticoagulation Instructions: INR 2.4 Continue coumadin 2.5mg  once daily   Current Anticoagulation Instructions: INR 1.8 Increase coumadin to 2.5mg  once daily except 5mg  on Thursdays

## 2010-05-20 NOTE — Progress Notes (Signed)
Summary: Coumadin Question [id number]  Phone Note Call from Patient   Caller: DaughterOlegario Messier) [phone] Reason for Call: Talk to Nurse Summary of Call: States that patient is having tooth extracted and Dr.Johnson states that most of his patients that are on coumadin hold it for 2 days prior and one day after/pls advise if this is what you want patient to do/tg Initial call taken by: Raechel Ache [facility],  February 19, 2009 10:25 AM  Follow-up for Phone Call        Dr Daleen Squibb is this OK with you Follow-up by: Vashti Hey RN,  February 19, 2009 10:44 AM  Additional Follow-up for Phone Call Additional follow up Details #1::        Yes> Thxs. Additional Follow-up by: Gaylord Shih, MD, [facility],  February 19, 2009 1:34 PM     Appended Document: Coumadin Question [id number] Informed pt's daughter Karle Starch to proceed witrh above plan.  F/U INR appt made for 03/07/09.

## 2010-05-20 NOTE — Letter (Signed)
Summary: [facility] Office Note  [facility] Office Note   Imported By: Roderic Ovens 09/12/2009 15:27:16  _____________________________________________________________________  External Attachment:    Type:   Image     Comment:   External Document

## 2010-05-20 NOTE — Letter (Signed)
Summary: [facility] Progress Note  [facility] Progress Note   Imported By: Roderic Ovens 08/22/2009 12:33:19  _____________________________________________________________________  External Attachment:    Type:   Image     Comment:   External Document

## 2010-05-20 NOTE — Medication Information (Signed)
Summary: ccr-lr  Anticoagulant Therapy  Managed by: Vashti Hey RN PCP: Dr.[name] Supervising MD: Diona Browner MD, Remi Deter Indication 1: Atrial Fibrillation (ICD-427.31) Lab Used: [facility] [place] Site: [place] INR POC 2.2  Dietary changes: no    Health status changes: no    Bleeding/hemorrhagic complications: no    Recent/future hospitalizations: no    Any changes in medication regimen? no    Recent/future dental: no  Any missed doses?: no       Is patient compliant with meds? yes       Allergies: No Known Drug Allergies  Anticoagulation Management History:      The patient is taking warfarin and comes in today for a routine follow up visit.  Positive risk factors for bleeding include an age of 75 years or older.  The bleeding index is 'intermediate risk'.  Positive CHADS2 values include History of HTN and Age > 47 years old.  The start date was 08/26/2006.  His last INR was 3.2.  Anticoagulation responsible provider: Diona Browner MD, Remi Deter.  INR POC: 2.2.  Cuvette Lot#: [id number].  Exp: 10/11.    Anticoagulation Management Assessment/Plan:      The patient's current anticoagulation dose is Warfarin sodium 2.5 mg tabs: Take 1 tablet by mouth as directed.  The target INR is 2 - 3.  The next INR is due 03/03/2010.  Anticoagulation instructions were given to patient.  Results were reviewed/authorized by Vashti Hey RN.  He was notified by Vashti Hey RN.         Prior Anticoagulation Instructions: INR 2.7 Continue coumadin 2.5mg  once daily except 5mg  on Thursdays  Current Anticoagulation Instructions: INR 2.2 Continue coumadin 2.5mg  once daily except 5mg  on Thursdays

## 2010-05-20 NOTE — Medication Information (Signed)
Summary: PROTIME/TG  Anticoagulant Therapy  Managed by: Vashti Hey RN PCP: Dr.[name] Supervising MD: Dietrich Pates MD, Molly Maduro Indication 1: Atrial Fibrillation (ICD-427.31) Lab Used: [facility] [place] Site: [place] INR POC 5.4  Dietary changes: no    Health status changes: no    Bleeding/hemorrhagic complications: no    Recent/future hospitalizations: no    Any changes in medication regimen? no    Recent/future dental: no  Any missed doses?: no       Is patient compliant with meds? yes       Allergies: No Known Drug Allergies  Anticoagulation Management History:      The patient is taking warfarin and comes in today for a routine follow up visit.  Positive risk factors for bleeding include an age of 75 years or older.  The bleeding index is 'intermediate risk'.  Positive CHADS2 values include History of HTN and Age > 75 years old.  The start date was 08/26/2006.  His last INR was 3.2.  Anticoagulation responsible provider: Dietrich Pates MD, Molly Maduro.  INR POC: 5.4.  Exp: 10/11.    Anticoagulation Management Assessment/Plan:      The patient's current anticoagulation dose is Warfarin sodium 2.5 mg tabs: Take 1 tablet by mouth as directed, Warfarin sodium 2.5 mg tabs: take as directed per coumadin clinic.  The target INR is 2 - 3.  The next INR is due 07/24/2009.  Anticoagulation instructions were given to patient.  Results were reviewed/authorized by Vashti Hey RN.  He was notified by Vashti Hey RN.         Prior Anticoagulation Instructions: Received fax from [place] with PT/INR results obtained on pt 05/06/09.  PT 32.7  INR 3.2   Pt told to hold coumadin tonight then resume 2.5mg  once daily except 5mg  on M,W,F and repeat INR in 1 month and fax results to office.   Current Anticoagulation Instructions: INR 5.4 Hold coumadin tonight and tomorrow night.  Recheck INR Wed. 07/24/09

## 2010-05-20 NOTE — Medication Information (Signed)
Summary: ccr-lr  Anticoagulant Therapy  Managed by: Vashti Hey RN PCP: Dr.[name] Supervising MD: Dietrich Pates MD, Molly Maduro Indication 1: Atrial Fibrillation (ICD-427.31) Lab Used: [facility]  Site: [place] INR POC 2.9  Dietary changes: no    Health status changes: no    Bleeding/hemorrhagic complications: no    Recent/future hospitalizations: no    Any changes in medication regimen? no    Recent/future dental: no  Any missed doses?: no       Is patient compliant with meds? yes       Allergies: No Known Drug Allergies  Anticoagulation Management History:      The patient is taking warfarin and comes in today for a routine follow up visit.  Positive risk factors for bleeding include an age of 75 years or older.  The bleeding index is 'intermediate risk'.  Positive CHADS2 values include History of HTN and Age > 16 years old.  The start date was 08/26/2006.  His last INR was 3.2.  Anticoagulation responsible provider: Dietrich Pates MD, Molly Maduro.  INR POC: 2.9.  Cuvette Lot#: [id number].  Exp: 10/11.    Anticoagulation Management Assessment/Plan:      The patient's current anticoagulation dose is Warfarin sodium 2.5 mg tabs: Take 1 tablet by mouth as directed, Warfarin sodium 2.5 mg tabs: take as directed per coumadin clinic.  The target INR is 2 - 3.  The next INR is due 08/15/2009.  Anticoagulation instructions were given to patient.  Results were reviewed/authorized by Vashti Hey RN.  He was notified by Vashti Hey RN.         Prior Anticoagulation Instructions: INR 3.0 Restart coumadin at 2.5mg  once daily   Current Anticoagulation Instructions: INR 2.9 Continue coumadin 2.5mg  once daily

## 2010-05-22 NOTE — Medication Information (Signed)
Summary: ccr-lr  Anticoagulant Therapy  Managed by: Vashti Hey RN PCP: Dr.[name] Supervising MD: Dietrich Pates MD, Molly Maduro Indication 1: Atrial Fibrillation (ICD-427.31) Lab Used: [facility] [place] Site: [place] INR POC 2.0  Dietary changes: no    Health status changes: no    Bleeding/hemorrhagic complications: no    Recent/future hospitalizations: no    Any changes in medication regimen? no    Recent/future dental: no  Any missed doses?: no       Is patient compliant with meds? yes       Allergies: No Known Drug Allergies  Anticoagulation Management History:      The patient is taking warfarin and comes in today for a routine follow up visit.  Positive risk factors for bleeding include an age of 75 years or older.  The bleeding index is 'intermediate risk'.  Positive CHADS2 values include History of HTN and Age > 36 years old.  The start date was 08/26/2006.  His last INR was 3.2.  Anticoagulation responsible Laneshia Pina: Dietrich Pates MD, Molly Maduro.  INR POC: 2.0.  Cuvette Lot#: [id number].  Exp: 10/11.    Anticoagulation Management Assessment/Plan:      The patient's current anticoagulation dose is Warfarin sodium 2.5 mg tabs: Take 1 tablet by mouth as directed.  The target INR is 2 - 3.  The next INR is due 07/28/2010.  Anticoagulation instructions were given to patient.  Results were reviewed/authorized by Vashti Hey RN.  He was notified by Vashti Hey RN.         Prior Anticoagulation Instructions: INR 2.5 Continue coumadin 2.5mg  once daily except 5mg  on Thursdays   Current Anticoagulation Instructions: INR 2.0 Take coumadin 1 1/2 tablets tonight then resume 1 tablet once daily except 2 tablets on Thursdays Pt leaving for [place] after Christmas. Will not be back until April.  Has an MD there that will manage coumadin.

## 2010-07-02 LAB — DIFFERENTIAL
Basophils Absolute: 0 10*3/uL (ref 0.0–0.1)
Basophils Relative: 1 % (ref 0–1)
Eosinophils Absolute: 0.2 10*3/uL (ref 0.0–0.7)
Eosinophils Relative: 3 % (ref 0–5)
Lymphocytes Relative: 45 % (ref 12–46)
Lymphs Abs: 3.7 10*3/uL (ref 0.7–4.0)
Monocytes Absolute: 0.7 10*3/uL (ref 0.1–1.0)
Monocytes Relative: 9 % (ref 3–12)
Neutro Abs: 3.4 10*3/uL (ref 1.7–7.7)
Neutrophils Relative %: 42 % — ABNORMAL LOW (ref 43–77)

## 2010-07-02 LAB — CBC
HCT: 46.7 % (ref 39.0–52.0)
Hemoglobin: 16 g/dL (ref 13.0–17.0)
MCH: 32 pg (ref 26.0–34.0)
MCHC: 34.4 g/dL (ref 30.0–36.0)
MCV: 92.9 fL (ref 78.0–100.0)
Platelets: 205 10*3/uL (ref 150–400)
RBC: 5.02 MIL/uL (ref 4.22–5.81)
RDW: 13.2 % (ref 11.5–15.5)
WBC: 8.1 10*3/uL (ref 4.0–10.5)

## 2010-07-02 LAB — COMPREHENSIVE METABOLIC PANEL
ALT: 21 U/L (ref 0–53)
AST: 25 U/L (ref 0–37)
Albumin: 4.2 g/dL (ref 3.5–5.2)
Alkaline Phosphatase: 83 U/L (ref 39–117)
BUN: 13 mg/dL (ref 6–23)
CO2: 28 mEq/L (ref 19–32)
Calcium: 10.2 mg/dL (ref 8.4–10.5)
Chloride: 105 mEq/L (ref 96–112)
Creatinine, Ser: 0.85 mg/dL (ref 0.4–1.5)
GFR calc Af Amer: >60 mL/min (ref >60)
GFR calc non Af Amer: >60 mL/min (ref >60)
Glucose, Bld: 131 mg/dL — ABNORMAL HIGH (ref 70–99)
Potassium: 4 mEq/L (ref 3.5–5.1)
Sodium: 140 mEq/L (ref 135–145)
Total Bilirubin: 0.8 mg/dL (ref 0.3–1.2)
Total Protein: 7.2 g/dL (ref 6.0–8.3)

## 2010-07-02 LAB — LACTATE DEHYDROGENASE: LDH: 125 U/L (ref 94–250)

## 2010-07-02 LAB — SEDIMENTATION RATE: Sed Rate: 1 mm/hr (ref 0–16)

## 2010-07-07 LAB — POTASSIUM: Potassium: 3.6 mEq/L (ref 3.5–5.1)

## 2010-07-09 LAB — COMPREHENSIVE METABOLIC PANEL
ALT: 30 U/L (ref 0–53)
AST: 24 U/L (ref 0–37)
Albumin: 3.8 g/dL (ref 3.5–5.2)
Alkaline Phosphatase: 89 U/L (ref 39–117)
BUN: 11 mg/dL (ref 6–23)
CO2: 28 mEq/L (ref 19–32)
Calcium: 9.3 mg/dL (ref 8.4–10.5)
Chloride: 102 mEq/L (ref 96–112)
Creatinine, Ser: 0.86 mg/dL (ref 0.4–1.5)
GFR calc Af Amer: >60 mL/min (ref >60)
GFR calc non Af Amer: >60 mL/min (ref >60)
Glucose, Bld: 159 mg/dL — ABNORMAL HIGH (ref 70–99)
Potassium: 3.4 mEq/L — ABNORMAL LOW (ref 3.5–5.1)
Sodium: 137 mEq/L (ref 135–145)
Total Bilirubin: 0.9 mg/dL (ref 0.3–1.2)
Total Protein: 7.1 g/dL (ref 6.0–8.3)

## 2010-07-09 LAB — CBC
HCT: 44.4 % (ref 39.0–52.0)
Hemoglobin: 15.7 g/dL (ref 13.0–17.0)
MCHC: 35.5 g/dL (ref 30.0–36.0)
MCV: 89.8 fL (ref 78.0–100.0)
Platelets: 188 10*3/uL (ref 150–400)
RBC: 4.94 MIL/uL (ref 4.22–5.81)
RDW: 12.9 % (ref 11.5–15.5)
WBC: 5.6 10*3/uL (ref 4.0–10.5)

## 2010-07-09 LAB — DIFFERENTIAL
Basophils Absolute: 0 10*3/uL (ref 0.0–0.1)
Basophils Relative: 0 % (ref 0–1)
Eosinophils Absolute: 0.1 10*3/uL (ref 0.0–0.7)
Eosinophils Relative: 2 % (ref 0–5)
Lymphocytes Relative: 41 % (ref 12–46)
Lymphs Abs: 2.3 10*3/uL (ref 0.7–4.0)
Monocytes Absolute: 0.4 10*3/uL (ref 0.1–1.0)
Monocytes Relative: 7 % (ref 3–12)
Neutro Abs: 2.8 10*3/uL (ref 1.7–7.7)
Neutrophils Relative %: 50 % (ref 43–77)

## 2010-07-09 LAB — LACTATE DEHYDROGENASE: LDH: 112 U/L (ref 94–250)

## 2010-07-11 ENCOUNTER — Telehealth: Payer: Self-pay | Admitting: Cardiology

## 2010-07-11 MED ORDER — DILTIAZEM HCL ER COATED BEADS 180 MG PO CP24: 180.0000 mg | ORAL_CAPSULE | Freq: Every day | ORAL | Status: DC

## 2010-07-11 NOTE — Telephone Encounter (Signed)
Diltiazem er 180+24mg  #30

## 2010-07-18 ENCOUNTER — Encounter: Payer: Self-pay | Admitting: Cardiology

## 2010-07-18 DIAGNOSIS — I4891 Unspecified atrial fibrillation: Secondary | ICD-10-CM

## 2010-07-18 DIAGNOSIS — Z7901 Long term (current) use of anticoagulants: Secondary | ICD-10-CM | POA: Insufficient documentation

## 2010-07-23 ENCOUNTER — Ambulatory Visit (INDEPENDENT_AMBULATORY_CARE_PROVIDER_SITE_OTHER): Payer: Medicare Other | Admitting: *Deleted

## 2010-07-23 DIAGNOSIS — I4891 Unspecified atrial fibrillation: Secondary | ICD-10-CM

## 2010-07-23 DIAGNOSIS — Z7901 Long term (current) use of anticoagulants: Secondary | ICD-10-CM

## 2010-07-23 LAB — POCT INR: INR: 2.2

## 2010-07-24 LAB — CBC
HCT: 49.2 % (ref 39.0–52.0)
Hemoglobin: 17.5 g/dL — ABNORMAL HIGH (ref 13.0–17.0)
MCHC: 35.7 g/dL (ref 30.0–36.0)
MCV: 90.9 fL (ref 78.0–100.0)
Platelets: 235 10*3/uL (ref 150–400)
RBC: 5.41 MIL/uL (ref 4.22–5.81)
RDW: 13.6 % (ref 11.5–15.5)
WBC: 7.8 10*3/uL (ref 4.0–10.5)

## 2010-07-24 LAB — DIFFERENTIAL
Basophils Absolute: 0 10*3/uL (ref 0.0–0.1)
Basophils Relative: 0 % (ref 0–1)
Eosinophils Absolute: 0.1 10*3/uL (ref 0.0–0.7)
Eosinophils Relative: 2 % (ref 0–5)
Lymphocytes Relative: 40 % (ref 12–46)
Lymphs Abs: 3.1 10*3/uL (ref 0.7–4.0)
Monocytes Absolute: 0.7 10*3/uL (ref 0.1–1.0)
Monocytes Relative: 9 % (ref 3–12)
Neutro Abs: 3.9 10*3/uL (ref 1.7–7.7)
Neutrophils Relative %: 49 % (ref 43–77)

## 2010-07-24 LAB — COMPREHENSIVE METABOLIC PANEL
ALT: 24 U/L (ref 0–53)
AST: 26 U/L (ref 0–37)
Albumin: 4.1 g/dL (ref 3.5–5.2)
Alkaline Phosphatase: 90 U/L (ref 39–117)
BUN: 16 mg/dL (ref 6–23)
CO2: 29 mEq/L (ref 19–32)
Calcium: 10.2 mg/dL (ref 8.4–10.5)
Chloride: 102 mEq/L (ref 96–112)
Creatinine, Ser: 0.93 mg/dL (ref 0.4–1.5)
GFR calc Af Amer: >60 mL/min (ref >60)
GFR calc non Af Amer: >60 mL/min (ref >60)
Glucose, Bld: 141 mg/dL — ABNORMAL HIGH (ref 70–99)
Potassium: 4.1 mEq/L (ref 3.5–5.1)
Sodium: 137 mEq/L (ref 135–145)
Total Bilirubin: 1.1 mg/dL (ref 0.3–1.2)
Total Protein: 7.5 g/dL (ref 6.0–8.3)

## 2010-07-24 LAB — LACTATE DEHYDROGENASE: LDH: 149 U/L (ref 94–250)

## 2010-07-30 LAB — GLUCOSE, CAPILLARY: Glucose-Capillary: 207 mg/dL — ABNORMAL HIGH (ref 70–99)

## 2010-07-30 LAB — COMPREHENSIVE METABOLIC PANEL
ALT: 17 U/L (ref 0–53)
AST: 23 U/L (ref 0–37)
Albumin: 3.9 g/dL (ref 3.5–5.2)
Alkaline Phosphatase: 80 U/L (ref 39–117)
BUN: 15 mg/dL (ref 6–23)
CO2: 28 mEq/L (ref 19–32)
Calcium: 9.5 mg/dL (ref 8.4–10.5)
Chloride: 102 mEq/L (ref 96–112)
Creatinine, Ser: 0.88 mg/dL (ref 0.4–1.5)
GFR calc Af Amer: >60 mL/min (ref >60)
GFR calc non Af Amer: >60 mL/min (ref >60)
Glucose, Bld: 90 mg/dL (ref 70–99)
Potassium: 3.8 mEq/L (ref 3.5–5.1)
Sodium: 139 mEq/L (ref 135–145)
Total Bilirubin: 1.4 mg/dL — ABNORMAL HIGH (ref 0.3–1.2)
Total Protein: 7 g/dL (ref 6.0–8.3)

## 2010-07-30 LAB — DIFFERENTIAL
Basophils Absolute: 0 10*3/uL (ref 0.0–0.1)
Basophils Relative: 1 % (ref 0–1)
Eosinophils Absolute: 0.2 10*3/uL (ref 0.0–0.7)
Eosinophils Relative: 3 % (ref 0–5)
Lymphocytes Relative: 44 % (ref 12–46)
Lymphs Abs: 3 10*3/uL (ref 0.7–4.0)
Monocytes Absolute: 0.6 10*3/uL (ref 0.1–1.0)
Monocytes Relative: 9 % (ref 3–12)
Neutro Abs: 2.9 10*3/uL (ref 1.7–7.7)
Neutrophils Relative %: 44 % (ref 43–77)

## 2010-07-30 LAB — CBC
HCT: 45.7 % (ref 39.0–52.0)
Hemoglobin: 16 g/dL (ref 13.0–17.0)
MCHC: 35.1 g/dL (ref 30.0–36.0)
MCV: 92.8 fL (ref 78.0–100.0)
Platelets: 186 10*3/uL (ref 150–400)
RBC: 4.92 MIL/uL (ref 4.22–5.81)
RDW: 13.3 % (ref 11.5–15.5)
WBC: 6.7 10*3/uL (ref 4.0–10.5)

## 2010-07-30 LAB — LACTATE DEHYDROGENASE: LDH: 198 U/L (ref 94–250)

## 2010-08-04 ENCOUNTER — Encounter (HOSPITAL_COMMUNITY): Payer: Medicare Other | Attending: Oncology

## 2010-08-04 DIAGNOSIS — C8589 Other specified types of non-Hodgkin lymphoma, extranodal and solid organ sites: Secondary | ICD-10-CM | POA: Insufficient documentation

## 2010-08-04 DIAGNOSIS — I4891 Unspecified atrial fibrillation: Secondary | ICD-10-CM | POA: Insufficient documentation

## 2010-08-04 DIAGNOSIS — Z9221 Personal history of antineoplastic chemotherapy: Secondary | ICD-10-CM | POA: Insufficient documentation

## 2010-08-04 DIAGNOSIS — I10 Essential (primary) hypertension: Secondary | ICD-10-CM | POA: Insufficient documentation

## 2010-08-05 ENCOUNTER — Ambulatory Visit (HOSPITAL_COMMUNITY)
Admission: RE | Admit: 2010-08-05 | Discharge: 2010-08-05 | Disposition: A | Discharge status: Home / Self Care | Payer: Medicare Other | Source: Ambulatory Visit | Attending: Oncology | Admitting: Oncology

## 2010-08-05 ENCOUNTER — Encounter (HOSPITAL_COMMUNITY): Payer: Self-pay

## 2010-08-05 DIAGNOSIS — Z09 Encounter for follow-up examination after completed treatment for conditions other than malignant neoplasm: Secondary | ICD-10-CM | POA: Insufficient documentation

## 2010-08-05 DIAGNOSIS — C8590 Non-Hodgkin lymphoma, unspecified, unspecified site: Secondary | ICD-10-CM

## 2010-08-05 DIAGNOSIS — N210 Calculus in bladder: Secondary | ICD-10-CM | POA: Insufficient documentation

## 2010-08-05 DIAGNOSIS — C8589 Other specified types of non-Hodgkin lymphoma, extranodal and solid organ sites: Secondary | ICD-10-CM | POA: Insufficient documentation

## 2010-08-05 HISTORY — DX: Essential (primary) hypertension: I10

## 2010-08-05 MED ORDER — IOHEXOL 300 MG/ML  SOLN
100.0000 mL | Freq: Once | INTRAMUSCULAR | Status: AC | PRN
  Administered 2010-08-05: 100 mL via INTRAVENOUS

## 2010-08-13 ENCOUNTER — Ambulatory Visit (HOSPITAL_COMMUNITY): Payer: Self-pay | Admitting: Oncology

## 2010-08-13 ENCOUNTER — Encounter (HOSPITAL_COMMUNITY): Payer: Medicare Other | Admitting: Oncology

## 2010-08-13 DIAGNOSIS — C8589 Other specified types of non-Hodgkin lymphoma, extranodal and solid organ sites: Secondary | ICD-10-CM

## 2010-08-21 ENCOUNTER — Ambulatory Visit (INDEPENDENT_AMBULATORY_CARE_PROVIDER_SITE_OTHER): Payer: Medicare Other | Admitting: *Deleted

## 2010-08-21 DIAGNOSIS — Z7901 Long term (current) use of anticoagulants: Secondary | ICD-10-CM

## 2010-08-21 DIAGNOSIS — I4891 Unspecified atrial fibrillation: Secondary | ICD-10-CM

## 2010-08-21 LAB — POCT INR: INR: 2.2

## 2010-09-02 NOTE — Assessment & Plan Note (Signed)
[facility]                            CARDIOLOGY OFFICE NOTE   NAME:[name], [name]                       MRN:          [id number]  DATE:04/12/2007                            DOB:          03-16-1924    [name] returns today for the following issues:  1. Chronic atrial fibrillation.  He is rate control and      anticoagulation.  His INR has been therapeutic in [place].  2. Positional dizziness, now much less of a problem than it was in      August.  He occasionally rolls over in bed and gets dizzy.  He does      not seem to have any orthostatic symptoms.  3. Hypertension.  4. Anticoagulation.   Of note, he says when he stretches his head back real quickly he  sometimes gets dizzy.  Makes you worry about vertebral basilar  insufficiency.  He has never had carotids or vertebrals done.   He is getting ready to go [place] for 3 months with his wife to get out  of the winter cold.  They go in a camper and return in 3 months.   His medications are unchanged since his last visit.   His blood pressure is 142/86, pulse is 85 and regular.  His weight is  206 and stable.  HEENT:  Normocephalic, atraumatic.  PERRLA.  Extraocular movements  intact.  Sclerae clear.  Facial symmetry is normal.  Carotid upstrokes are equal bilaterally without bruits.  There is no  JVD, thyroid is not enlarged, trachea is midline.  LUNGS:  Clear.  HEART:  Reveals a nondisplaced PMI, regular rate and rhythm.  ABDOMINAL EXAM:  Soft, good bowel sounds.  No midline bruit.  EXTREMITIES:  Reveal no cyanosis, clubbing, or edema.  Pulses are  intact.  NEURO EXAM:  Intact.   ASSESSMENT AND PLAN:  [name] is doing well.  He would like me to  check a Protime today.  We will do that before he leaves for [place].  When he returns I want to see him in followup.  At that time we will  probably get carotid Dopplers and look at his vertebrals.     Thomas C. Daleen Squibb, MD, [facility]  Electronically Signed    TCW/MedQ  DD: 04/12/2007  DT: 04/12/2007  Job #: [id number]   cc:   Ladona Horns. Mariel Sleet, MD  [name] D. Felecia Shelling, MD

## 2010-09-02 NOTE — Assessment & Plan Note (Signed)
[facility]                       [place] CARDIOLOGY OFFICE NOTE   NAME:[name], [name]                       MRN:          [id number]  DATE:03/12/2008                            DOB:          1923/06/16    [name] comes in today for followup.  He has been feeling remarkably  well.  He has no symptoms of tachy-palpitations, presyncope, syncope,  shortness of breath, or angina.   He is followed for chronic atrial fibrillation, which is by virtue  totally asymptomatic.  He is also rate controlled and is on  anticoagulation.  This is followed in our [employer].  He also  has a history of hypertension.   He is getting ready to go to [place] for the winter.  He has  arrangements there to have his Coumadin monitored as well as his blood  pressure and Port-A-Cath.   His meds are unchanged since his last visit.  Please refer to the  maintenance medication list.   PHYSICAL EXAMINATION:  VITAL SIGNS:  His blood pressure today is 130/72,  his pulse is 66 and regular, and his weight is 198.  HEENT:  Unchanged except for some slight scleral injection.  LUNGS:  Clear to auscultation and percussion.  HEART:  Irregular rate and rhythm.  No murmurs or gallops.  ABDOMEN:  Soft, good bowel sounds.  EXTREMITIES:  No cyanosis, clubbing, or edema.  Pulses are intact.   Mr. Michaux is stable from my standpoint.  I would like to see his  pressures under good control.  We will see him back again in April 2010.     Thomas C. Daleen Squibb, MD, [facility]  Electronically Signed    TCW/MedQ  DD: 03/12/2008  DT: 03/13/2008  Job #: [id number]

## 2010-09-02 NOTE — Assessment & Plan Note (Signed)
[facility]                       [place] CARDIOLOGY OFFICE NOTE   NAME:Foster, [name] CARDOZA                       MRN:          [id number]  DATE:09/22/2006                            DOB:          1923-09-07    [name] returns today for followup of his new-onset asymptomatic  atrial fibrillation.   Please see my initial office evaluation, Aug 26, 2006.   He has been on Coumadin now for about a month.  His INR today is 2.8.  We changed his amlodipine to diltiazem extended release 180 daily.   A 2D echocardiogram in [place] showed moderate left atrial  enlargement, mild right atrial enlargement, mild right ventricular  dilatation, normal RV function, mild aortic valve sclerosis, normal LV  chamber size and function, and no evidence of LVH.   He does have a history of hypertension.   PHYSICAL EXAMINATION:  VITAL SIGNS:  Blood pressure 140/80, pulse 62 and  regular.  EKG confirms sinus rhythm, RSR prime in V1 and V2, otherwise  normal.  He has a borderline first-degree AV block.  Weight is 201.  HEENT:  Normocephalic, atraumatic.  PERRLA.  Extraocular movements  intact.  Sclerae clear.  Facial symmetry is normal.  NECK:  Supple.  Carotid upstrokes are equal bilaterally, without bruits.  There is no JVD.  Thyroid is not enlarged.  Trachea is midline.  LUNGS:  Clear.  HEART:  Reveals a nondisplaced PMI.  He has a soft systolic murmur at  the apex.  No gallop.  ABDOMEN:  Soft, with good bowel sounds.  EXTREMITIES:  Reveal no edema.  Pulses are present.  NEUROLOGIC:  Intact.   ASSESSMENT AND PLAN:  I am delighted that [name] is back in sinus  rhythm.  We will make no changes in his medical program, except we will  stop his aspirin.  He is to remain on Coumadin as long as he can take  it, because his atrial fibrillation is asymptomatic and his [place] score  is moderate risk.   I will plan on seeing him back in three months.     Thomas C.  Daleen Squibb, MD, [facility]  Electronically Signed   TCW/MedQ  DD: 09/22/2006  DT: 09/22/2006  Job #: [id number]   cc:   Ladona Horns. Mariel Sleet, MD  [name] D. Felecia Shelling, MD

## 2010-09-02 NOTE — Assessment & Plan Note (Signed)
[place] [facility]                            CARDIOLOGY OFFICE NOTE   NAME:Mizer, RUFINO STAUP                       MRN:          [id number]  DATE:12/13/2006                            DOB:          11-05-1923    Mr. Kitt comes in today per the advice of the [facility] for dizziness.   He is dizzy when he rolls over on his left side in bed, he is also  lightheaded and dizzy when he stands up too quickly.   PROBLEM LIST:  Atrial fibrillation.  Last visit he was back in sinus  rhythm.  Today he is back in atrial fib.  I suspect he is going in and  out.  We are really treating him for rate control and anticoagulation.  His INRs have been very stable in [place] and his last one was 2.9.  He also has a history of hypertension that Dr. Felecia Shelling is treating.   MEDS:  1. Potassium 10 mEq a day.  2. Lisinopril HCTZ 20/12.5 daily.  3. Amitriptyline 12.5 daily.  4. Warfarin 2.5 mg as directed.  5. Diltiazem extended release 180 daily.  He did not take his      diltiazem this morning.   His blood pressure today is 138/86, a heart rate of 86 lying down.  Sitting up it drops to 120/74 with a heart rate of 60-70, standing for 2  minutes it is 124/73, standing for 5 minutes it is 124/75, heart rate  increased to about 95 to 110.  He had no significant dizziness except  when he first sat up.  His weight is 203.  HEENT:  He has a ruddy complexion, PERRLA, extraocular movements intact,  sclera clear, facial symmetry is normal.  Dentition satisfactory.  CAROTID UPSTROKES:  Equal bilaterally without bruits, no JVD, thyroid is  not enlarged, trachea is midline.  LUNGS:  Clear.  HEART:  A poorly appreciated PMI, he has a variable rate and rhythm.  ABDOMINAL EXAM:  Protuberant with good bowel sounds.  There is no  midline bruit.  EXTREMITIES:  No edema.  Pulses are intact.  NEURO EXAM:  Intact.   Electrocardiogram shows atrial fib with an RSR prime V1  V2, otherwise  unchanged.   ASSESSMENT AND PLAN:  1. Orthostatic hypotension.  2. He may have some component of benign positional vertigo as well.  3. Hypertension.  4. Atrial fibrillation.  5. Anticoagulation.   RECOMMENDATION:  1. Continue with his current meds but stagger them.  He will take his      lisinopril HCTZ in the morning, diltiazem extended release at      night.  2. Orthostatic precautions were reviewed at length with he and his      wife.  I will see him back in December.     Thomas C. Daleen Squibb, MD, [facility]  Electronically Signed    TCW/MedQ  DD: 12/13/2006  DT: 12/14/2006  Job #: [id number]   cc:   [name] D. Felecia Shelling, MD

## 2010-09-02 NOTE — Assessment & Plan Note (Signed)
[facility]                            CARDIOLOGY OFFICE NOTE   NAME:[name], [name]                       MRN:          [id number]  DATE:08/26/2006                            DOB:          1923-08-09    I was asked by Dr.  Glenford Peers to evaluate [name], a  delightful 77 year old gentleman, husband of a patient of mine, for a  new onset of atrial fibrillation.   He is totally asymptomatic. He does note that his heart beat is  irregular, but feels fine. He walks about a mile and a half a day with  his wife.   His risk factors for atrial fibrillation include age and hypertension  for about 30 some years.   PAST MEDICAL HISTORY:  He has a whole host of medical problems  including:  1. Stage 3A diffuse large B-cell lymphoma, status post six cycles of      CHOP plus Rituxan. His most recent PET scan was stable.  2. 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  3. His history is remarkable for chronic bronchitis, but he does not      have a history of smoking.  4. He has had a left inguinal hernia repair.  5. Cholecystectomy.  6. Lithotripsy for kidney stones in the past.  7. Rectal abscess, status post resection in 1994.  8. Hematospermia.   His past medical history is also significant for no known drug  allergies. He has no dye allergies.   He does not smoke, drink or use any recreational drugs and never has.   CURRENT MEDICATIONS:  1. Potassium 10 mEq a day.  2. Lisinopril hydrochlorothiazide 20/12.5 daily.  3. Amlodipine 5 mg a day.  4. Aspirin 325 a day.  5. Amitriptyline 12.5 mg a day.   He has no bleeding diaphysis or history of bleeding.   PAST SURGICAL HISTORY:  1. Cholecystectomy in 1973.  2. Rectal abscess in 1994.   FAMILY HISTORY:  Is really remarkable for a mother who died of a heart  attack at age 17, though he is now 73!   SOCIAL HISTORY:  He is retired. He is married and has four children.  One  of his daughters, named Rene Kocher, is here with him today. So is his wife.   REVIEW OF SYSTEMS:  Other than HPI is positive for a hiatal hernia,  history of gastroesophageal reflux disease, kidney stones in 2001 and  cancer in 2005.   PHYSICAL EXAMINATION:  He is extremely delightful gentleman in no acute  distress. His blood pressure is 132/83, pulse 90-100 and irregular.  Height is 5 feet, 10 inches. He weighs 202 pounds.  Slightly ruddy complexion.  PERRLA. Extra-ocular movements intact. Sclerae are clear. Carotid  upstrokes are equal bilaterally without bruits. There is no JVD.  Thyroid is not enlarged. Trachea is midline.  NECK: Supple.  LUNGS:  Are clear with reduced breath sounds throughout.  HEART: Reveals a nondisplaced PMI. He has a normal S1, S2. No right  ventricular lift. There is no gallop.  ABDOMEN: Protuberant with good bowel sounds. Organomegaly is difficult  to assess.  EXTREMITIES: Reveals trace edema. Pulses are intact.  NEURO: Intact.  SKIN: Shows a few ecchymosis.   Electrocardiogram from Dr.  [facility]ornton [facility] clinic shows atrial fibrillation  with nonspecific ST segment changes inferolaterally.   ASSESSMENT:  1. Asymptomatic atrial fibrillation, question new onset.  2. Other problems as mentioned above.   PLAN:  1. Change amlodipine to Diltiazem extended release 180 mg a day for      rate control and his blood pressure.  2. Coumadin. We started at 2.5 mg a day. Will have his protime checked      in [facility] on Monday, Aug 30, 2006.  3. Continue full strength aspirin until his Coumadin is therapeutic.   Of note, a 2D echo was obtained in [place], which I just found. He  has moderate left atrial enlargement, mild increasing right atrial size,  mild right ventricular dilatation with borderline right ventricular  hypertrophy, normal right ventricular function, normal valves except for  some mild  sclerosis of the aortic valve,  normal left ventricular chamber size. No  evidence of LVH and normal function.     Thomas C. Daleen Squibb, MD, [facility]  Electronically Signed    TCW/MedQ  DD: 08/26/2006  DT: 08/26/2006  Job #: [id number]   cc:   Ladona Horns. Mariel Sleet, MD  Avon Gully, MD

## 2010-09-02 NOTE — Procedures (Signed)
NAME:  RUFORD, DUDZINSKI NO.:  [id number]   MEDICAL RECORD NO.:  [id number]          PATIENT TYPE:  OUT   LOCATION:  RAD                           FACILITY:  [facility]   PHYSICIAN:  Gerrit Friends. Dietrich Pates, MD, FACCDATE OF BIRTH:  1924/03/10   DATE OF PROCEDURE:  08/23/2006  DATE OF DISCHARGE:                                ECHOCARDIOGRAM   CLINICAL DATA:  An 77 year old gentleman with atrial fibrillation and  hypertension.   M-MODE:  Aorta 3.5, left atrium 5.0, septum 1.1, posterior wall 1.2, LV  diastole 4.1, LV systole 3.3, RV diastole 4.5.   1. Technically adequate echocardiographic study.  2. Mild to moderate left atrial enlargement; mild increase in right      atrial size.  3. Mild right ventricular dilatation; borderline hypertrophy; normal      systolic function.  4. Mild sclerosis of a trileaflet aortic valve.  5. Normal mitral valve.  6. Normal tricuspid and pulmonic valves; physiologic tricuspid      regurgitation; normal proximal pulmonary artery.  7. Normal aortic root.  8. Normal left ventricular size; wall thickness at the upper limit of      normal; normal regional and global function.      Gerrit Friends. Dietrich Pates, MD, [facility]  Electronically Signed     RMR/MEDQ  D:  08/23/2006  T:  08/23/2006  Job:  [id number]

## 2010-09-02 NOTE — Assessment & Plan Note (Signed)
[facility]                            CARDIOLOGY OFFICE NOTE   NAME:Foster, [name] LASKI                       MRN:          [id number]  DATE:09/09/2007                            DOB:          1923/12/14    [name] comes in today.   PROBLEM LIST:  1. Chronic atrial fibrillation which is totally asymptomatic.  He is      rate controlled and on anticoagulation.  His INR has been      therapeutic in [place].  2. Hypertension.   He just got an excellent report from Dr. Mariel Sleet with a negative PET  scan with his history of non-Hodgkins lymphoma.  He denies any orthopnea  or PND.  He does have some mild peripheral edema.  He has no chest pain  or palpitations.   MEDS:  1. Diltiazem extended release 180 daily.  2. Warfarin as directed.  3. Lisinopril/HCTZ 20/12.5 daily.  4. Potassium 10 mEq a day.  5. Multivitamin.  6. Prilosec over-the-counter.   PHYSICAL EXAMINATION:  His blood pressure 142/68, his pulse 72 and  fairly regular.  His weight is down 6 pounds to 200 (he gave up milk  shakes!).  HEENT:  Unchanged.  Carotids upstrokes are equal bilaterally without bruits, no JVD.  Thyroid is not enlarged.  Trachea is midline.  LUNGS:  Clear.  HEART:  Reveals a poorly appreciated PMI, irregular rate and rhythm.  Normal S1-S2.  ABDOMINAL EXAM:  Soft, good bowel sounds.  No midline bruits.  EXTREMITIES:  No edema.  Pulses are intact.  NEUROLOGIC EXAM:  Intact.   [name] is doing well.  We did not repeat an EKG, today.  We will  plan on seeing him back, again, in 6 months.     Thomas C. Daleen Squibb, MD, [facility]  Electronically Signed   TCW/MedQ  DD: 09/09/2007  DT: 09/09/2007  Job #: [id number]   cc:   [name] D. Felecia Shelling, MD  Ladona Horns. Mariel Sleet, MD

## 2010-09-02 NOTE — Assessment & Plan Note (Signed)
[facility]                       [place] CARDIOLOGY OFFICE NOTE   [name], [name]                       MRN:          [id number]  DATE:08/03/2008                            DOB:          08/29/1923    [name] comes in today for followup.   PROBLEM LIST:  1. Chronic atrial fibrillation which is asymptomatic.  2. Hypertension.  3. Normal left ventricular systolic function.  4. Moderate left atrial enlargement.  5. Mild right ventricular dysfunction.  6. Aortic valve sclerosis.   He is remarkably happy today because he got a clean PET scan concerning  his lymphoma.  He is now 5 years out!   He offers no complaints today including PND, orthopnea, or peripheral  edema.  He has had no syncope.  He has never had any palpitations.   His meds were unchanged since his last visit, except now he is on  omeprazole 20 mg a day.  He continues on Coumadin.  He reports no  bleeding including melena.   PHYSICAL EXAMINATION:  VITAL SIGNS:  Today, his blood pressure was  142/80.  His pulse is 60 and irregular.  His weight is 202 and stable.  HEENT:  Ruddy complexion, otherwise normal.  NECK:  Supple.  Carotid upstrokes were equal bilaterally without bruits.  Thyroid is not enlarged.  Trachea is midline.  CHEST:  Lungs are clear to auscultation and percussion.  HEART:  Soft systolic murmur along the left sternal border.  S2 splits.  ABDOMEN:  Soft, good bowel sounds.  No midline bruit.  EXTREMITIES:  No cyanosis, clubbing, or edema.  Pulses are present.  NEURO:  Intact.   ASSESSMENT AND PLAN:  [name] is doing well.  I have made no changes  in his medical program.  We will plan on seeing him back again in 6  months.     Thomas C. Daleen Squibb, MD, [name]  Electronically Signed    TCW/MedQ  DD: 08/03/2008  DT: 08/04/2008  Job #: [id number]

## 2010-09-03 ENCOUNTER — Ambulatory Visit (INDEPENDENT_AMBULATORY_CARE_PROVIDER_SITE_OTHER): Payer: Medicare Other | Admitting: Cardiology

## 2010-09-03 ENCOUNTER — Encounter: Payer: Self-pay | Admitting: Cardiology

## 2010-09-03 VITALS — BP 138/80 | HR 83 | Ht 70.0 in | Wt 198.0 lb

## 2010-09-03 DIAGNOSIS — I10 Essential (primary) hypertension: Secondary | ICD-10-CM

## 2010-09-03 DIAGNOSIS — I4891 Unspecified atrial fibrillation: Secondary | ICD-10-CM

## 2010-09-03 NOTE — Progress Notes (Signed)
   Patient ID: CHAWN SPRAGGINS, male    DOB: 01/25/1924, 77 y.o.   MRN: [id number]  HPI Mr Chopin returns for E and M of CAF, anticoagulation, and hypertension. He is doing great, remains active and has no complaints. He denies any bleeding or melena.    Review of Systems  All other systems reviewed and are negative.      Physical Exam  Nursing note and vitals reviewed. Constitutional: He is oriented to person, place, and time. He appears well-developed and well-nourished. No distress.  HENT:  Head: Normocephalic and atraumatic.  Eyes: EOM are normal. Pupils are equal, round, and reactive to light.  Neck: Neck supple. No JVD present. No tracheal deviation present. No thyromegaly present.  Cardiovascular: Normal rate, S1 normal, S2 normal and normal pulses.  An irregularly irregular rhythm present.  No extrasystoles are present. PMI is not displaced.   Pulmonary/Chest: Effort normal and breath sounds normal.  Abdominal: Soft. Bowel sounds are normal.  Musculoskeletal: He exhibits no edema.  Neurological: He is alert and oriented to person, place, and time.  Skin: Skin is dry.  Psychiatric: He has a normal mood and affect.

## 2010-09-03 NOTE — Assessment & Plan Note (Signed)
Doing well, no change in treatment. 

## 2010-09-03 NOTE — Progress Notes (Signed)
Addended byWaynette Buttery on: 09/03/2010 03:54 PM   Modules accepted: Orders

## 2010-09-03 NOTE — Assessment & Plan Note (Signed)
Improved

## 2010-09-03 NOTE — Patient Instructions (Signed)
**Note De-Identified  Obfuscation** Your physician has recommended you make the following change in your medication: stop taking   Your physician recommends that you schedule a follow-up appointment in: 6 months

## 2010-09-05 NOTE — Op Note (Signed)
NAME:  TYVON, EGGENBERGER NO.:  [id number]   MEDICAL RECORD NO.:  [id number]          PATIENT TYPE:  OBV   LOCATION:  0356                         FACILITY:  [facility]   PHYSICIAN:  Currie Paris, M.D.DATE OF BIRTH:  May 25, 1923   DATE OF PROCEDURE:  DATE OF DISCHARGE:                                 OPERATIVE REPORT   CCS NUMBER:  [id number].   PREOPERATIVE DIAGNOSIS:  Right lower quadrant mass, uncertain etiology.   POSTOPERATIVE DIAGNOSIS:  Gastrointestinal stromal tumor of small bowel.   OPERATION:  Laparoscopy, laparotomy with small bowel resection.   SURGEON:  Currie Paris, M.D.   ASSISTANT:  Abigail Miyamoto, M.D.   ANESTHESIA:  General endotracheal.   CLINICAL HISTORY:  This patient is a 77 year old gentleman who has been  treated for lymphoma.  As part of his workup, a right lower quadrant mass  was identified and has not really gotten any different with chemo and on PET  scan was suspicious for a little activity.  After discussion, we elected to  try a laparoscopy to see if this might be something amenable to laparoscopic  resection, but probably laparotomy.   DESCRIPTION OF PROCEDURE:  The patient was seen in the holding area and had  no further questions.  He was taken to the operating room, where after  satisfactory general endotracheal anesthesia had been obtained, the abdomen  was clipped, and a Foley catheter placed.  The abdomen was prepped and  draped.  Marcaine plain 0.25% was used for the umbilical incision, which was  made.  The fascia was opened, and the peritoneal cavity entered.  A Hasson  was introduced and held with a purse string.  On laparoscopy, we were  unclear that we could see something along the right lower quadrant but could  not really identify the mass.  I elected to go ahead with a small  laparotomy.   The scope was removed, and a short midline incision from the umbilicus to  about 2/3 of the way down the symphysis  made, and the peritoneal cavity  entered under direct vision.  On manual exam, I was able to palpate a mass  and brought it out into the abdomen.  It appeared to be a fleshy growth on  the antimesenteric border of the small bowel.  I elected to resect the small  bowel, thinking this was probably some low-grade malignancy, although I  could not be sure it was not an unusual-looking Meckel's.  We divided the  mesentery.  The more proximal and more distal ends of the small bowel were  tacked together. The small bowel was opened near the tumor, both proximal  and distal to the tumor, and the forks of the GI inserted.  The GI was  closed and fired.  The staple line was inspected and was dry.  Using a TA 60  the common defect was closed and the specimen cut off the stapler and sent  to pathology.  The lumen appeared quite patent, and there was no tension.  I  closed the mesentery with some 3-0 silk.  The small bowel was replaced in the abdomen.  The abdomen was closed with a  running 0 PDS in the peritoneum, running #1 PDS on the fascia.  The wound  was irrigated, and the skin closed with staples.   Patient tolerated the procedure well.  There were no intraoperative  complications.  All counts were correct.   Pathology report of this looked like a gastrointestinal stromal tumor, but  no frozen was done.     Chri   CJS/MEDQ  D:  02/14/2004  T:  02/14/2004  Job:  [id number]   cc:   Ladona Horns. Neijstrom, MD  [address]  [place]  [place] [address]  Fax: [phone]

## 2010-09-05 NOTE — Procedures (Signed)
NAME:  [name], [name]                          ACCOUNT NO.:  [id number]   MEDICAL RECORD NO.:  [id number]                   PATIENT TYPE:  OUT   LOCATION:  RESP                                 FACILITY:  [facility]   PHYSICIAN:  Edward L. Juanetta Gosling, M.D.             DATE OF BIRTH:  11-Nov-1923   DATE OF PROCEDURE:  DATE OF DISCHARGE:                              PULMONARY FUNCTION TEST   FINDINGS:  1. Spirometry shows decrease in flow between 25 and 75% of the forced vital     capacity.  This is suggestive of small airway disease.  2. Lung volumes show minimal restrictive change.  3. DLCO is normal.  4. Arterial blood gas is normal.      ___________________________________________                                            Oneal Deputy. Juanetta Gosling, M.D.   ELH/MEDQ  D:  04/04/2003  T:  04/04/2003  Job:  [id number]   cc:   [name] D. Felecia Shelling, M.D.  [address]  [place]  [place] [address]  Fax: [phone]

## 2010-09-05 NOTE — H&P (Signed)
NAME:  [name], [name] NO.:  [id number]   MEDICAL RECORD NO.:  [id number]                  PATIENT TYPE:   LOCATION:                                       FACILITY:   PHYSICIAN:  Dirk Dress. Katrinka Blazing, M.D.                DATE OF BIRTH:   DATE OF ADMISSION:  DATE OF DISCHARGE:                                HISTORY & PHYSICAL   This is a 77 year old male with history of a mass of his left posterolateral  neck.  The mass has been present since December 2004.  He was treated with  antibiotics without change.  The patient is scheduled to have the mass  excised under anesthesia.   PAST MEDICAL HISTORY:  1. Hypertension.  2. Gastroesophageal reflux disease.   MEDICATIONS:  1. Zestoretic.  2. Prilosec 20 mg every other day.  3. Multivitamins 1 daily.  4. Aspirin 81 mg daily.   ALLERGIES:  No known drug allergies.   PAST SURGICAL HISTORY:  1. Cholecystectomy.  2. I&D of perirectal abscess.   SOCIAL HISTORY:  He is married.  He is a retired Medical illustrator.  He does not  drink, smoke, or use drugs.   REVIEW OF SYSTEMS:  Negative for heart or lung disease.   PHYSICAL EXAMINATION:  VITAL SIGNS: Blood pressure 136/76, pulse 80,  respirations 18, weight 206 pounds.  HEENT:  Unremarkable.  NECK:  Supple, no JVD or bruit. There is a 2.5 cm left posterolateral lower  neck mass which is nontender, partially fixed on the lateral musculature.  No other nodes palpitations.  CHEST:  Clear to auscultation.  HEART:  Regular rate and rhythm without murmur, gallop, or rub.  ABDOMEN:  Soft, nontender, no masses.  EXTREMITIES:  No cyanosis, clubbing, or edema.  NEUROLOGIC:  No focal motor, sensory, or cerebellar deficits.   IMPRESSION:  1. Left neck mass.  2. Hypertension.  3. Gastroesophageal reflux disease.   PLAN:  Excision of left neck mass under anesthesia.     ___________________________________________                                         Dirk Dress. Katrinka Blazing,  M.D.   LCS/MEDQ  D:  08/03/2003  T:  08/03/2003  Job:  [id number]

## 2010-09-05 NOTE — Discharge Summary (Signed)
NAME:  AHMARION, SARACENO NO.:  [id number]   MEDICAL RECORD NO.:  [id number]          PATIENT TYPE:  INP   LOCATION:  0356                         FACILITY:  [facility]   PHYSICIAN:  Currie Paris, M.D.DATE OF BIRTH:  08-Nov-1923   DATE OF ADMISSION:  02/14/2004  DATE OF DISCHARGE:  02/18/2004                                 DISCHARGE SUMMARY   FINAL DIAGNOSES:  1.  Gastrointestinal stromal tumor of small intestine (final pathology      pending).  2.  History of lymphoma.   HISTORY OF PRESENT ILLNESS:  Mr. Seoane is a 77 year old gentleman recently  diagnosed with a lymphoma who has undergone chemotherapy.  As part of his  workup, a lesion was noted in the mid-right lower quadrant and did not  change significantly with chemotherapy and in fact got a little bit larger.  He was scheduled for exploration.   HOSPITAL COURSE:  The patient was admitted and taken to the operating room  where under laparotomy, a small bowel tumor of the terminal ileum was found  and resected.  He tolerated the procedure well.  He was offered clear  liquids on the first postoperative day and advanced to solids which he  tolerated nicely, with minimal problems.  He had no nausea and was having  bowel movements by the third postoperative day.  On the fourth postoperative  day, he had showered and had been up and around, tolerating a solid diet,  having bowel movements with minimal pain, and tolerating oral pain  medications.   On exam, his lungs were clear, he was afebrile, his abdomen was benign, and  his wounds were healing nicely.  The patient was felt ready for discharge.  At the time of discharge, his pathology report was pending.   DISPOSITION:  The patient is discharged in satisfactory condition, to resume  all usual home medications, Vicodin for pain, and he will follow up in my  office next week for staple removal, with subsequent followup with his  physician, Dr. Mariel Sleet, once  final pathology reports are back.     Chri   CJS/MEDQ  D:  02/18/2004  T:  02/18/2004  Job:  [id number]   cc:   Ladona Horns. Neijstrom, MD  [address]  [place]  [place] [address]  Fax: [phone]

## 2010-09-12 ENCOUNTER — Ambulatory Visit (HOSPITAL_COMMUNITY)
Admission: RE | Admit: 2010-09-12 | Discharge: 2010-09-12 | Disposition: A | Discharge status: Home / Self Care | Payer: Medicare Other | Source: Ambulatory Visit | Attending: Internal Medicine | Admitting: Internal Medicine

## 2010-09-12 ENCOUNTER — Other Ambulatory Visit (HOSPITAL_COMMUNITY): Payer: Self-pay | Admitting: Internal Medicine

## 2010-09-12 DIAGNOSIS — J40 Bronchitis, not specified as acute or chronic: Secondary | ICD-10-CM

## 2010-09-18 ENCOUNTER — Encounter: Payer: Medicare Other | Admitting: *Deleted

## 2010-09-22 ENCOUNTER — Encounter: Payer: Medicare Other | Admitting: *Deleted

## 2010-09-22 ENCOUNTER — Ambulatory Visit (INDEPENDENT_AMBULATORY_CARE_PROVIDER_SITE_OTHER): Payer: Medicare Other | Admitting: *Deleted

## 2010-09-22 DIAGNOSIS — I4891 Unspecified atrial fibrillation: Secondary | ICD-10-CM

## 2010-09-22 DIAGNOSIS — Z7901 Long term (current) use of anticoagulants: Secondary | ICD-10-CM

## 2010-09-22 LAB — POCT INR: INR: 2.1

## 2010-10-20 ENCOUNTER — Ambulatory Visit (INDEPENDENT_AMBULATORY_CARE_PROVIDER_SITE_OTHER): Payer: Medicare Other | Admitting: *Deleted

## 2010-10-20 DIAGNOSIS — Z7901 Long term (current) use of anticoagulants: Secondary | ICD-10-CM

## 2010-10-20 DIAGNOSIS — I4891 Unspecified atrial fibrillation: Secondary | ICD-10-CM

## 2010-10-20 LAB — POCT INR: INR: 2.4

## 2010-11-17 ENCOUNTER — Ambulatory Visit (INDEPENDENT_AMBULATORY_CARE_PROVIDER_SITE_OTHER): Payer: Medicare Other | Admitting: *Deleted

## 2010-11-17 DIAGNOSIS — Z7901 Long term (current) use of anticoagulants: Secondary | ICD-10-CM

## 2010-11-17 DIAGNOSIS — I4891 Unspecified atrial fibrillation: Secondary | ICD-10-CM

## 2010-11-17 LAB — POCT INR: INR: 2.1

## 2010-12-07 ENCOUNTER — Other Ambulatory Visit: Payer: Self-pay | Admitting: Cardiology

## 2010-12-15 ENCOUNTER — Ambulatory Visit (INDEPENDENT_AMBULATORY_CARE_PROVIDER_SITE_OTHER): Payer: Medicare Other | Admitting: *Deleted

## 2010-12-15 DIAGNOSIS — Z7901 Long term (current) use of anticoagulants: Secondary | ICD-10-CM

## 2010-12-15 DIAGNOSIS — I4891 Unspecified atrial fibrillation: Secondary | ICD-10-CM

## 2010-12-15 LAB — POCT INR: INR: 2.5

## 2011-01-12 ENCOUNTER — Ambulatory Visit (INDEPENDENT_AMBULATORY_CARE_PROVIDER_SITE_OTHER): Payer: Medicare Other | Admitting: *Deleted

## 2011-01-12 DIAGNOSIS — Z7901 Long term (current) use of anticoagulants: Secondary | ICD-10-CM

## 2011-01-12 DIAGNOSIS — I4891 Unspecified atrial fibrillation: Secondary | ICD-10-CM

## 2011-01-12 LAB — POCT INR: INR: 2.8

## 2011-01-13 LAB — CBC
HCT: 43
Hemoglobin: 15.2
MCHC: 35.4
MCV: 90.3
Platelets: 191
RBC: 4.76
RDW: 13.6
WBC: 6.8

## 2011-01-13 LAB — DIFFERENTIAL
Basophils Absolute: 0
Basophils Relative: 0
Eosinophils Absolute: 0.2
Eosinophils Relative: 2
Lymphocytes Relative: 47 — ABNORMAL HIGH
Lymphs Abs: 3.2
Monocytes Absolute: 0.5
Monocytes Relative: 7
Neutro Abs: 2.9
Neutrophils Relative %: 43

## 2011-01-13 LAB — COMPREHENSIVE METABOLIC PANEL
ALT: 19
AST: 20
Albumin: 4
Alkaline Phosphatase: 74
BUN: 13
CO2: 27
Calcium: 9.8
Chloride: 104
Creatinine, Ser: 0.81
GFR calc Af Amer: >60
GFR calc non Af Amer: >60
Glucose, Bld: 96
Potassium: 3.7
Sodium: 140
Total Bilirubin: 1
Total Protein: 6.7

## 2011-01-13 LAB — LACTATE DEHYDROGENASE: LDH: 122

## 2011-01-19 LAB — COMPREHENSIVE METABOLIC PANEL
ALT: 21
AST: 20
Albumin: 3.8
Alkaline Phosphatase: 77
BUN: 14
CO2: 29
Calcium: 9.7
Chloride: 105
Creatinine, Ser: 0.92
GFR calc Af Amer: >60
GFR calc non Af Amer: >60
Glucose, Bld: 143 — ABNORMAL HIGH
Potassium: 3.6
Sodium: 139
Total Bilirubin: 1.2
Total Protein: 6.8

## 2011-01-19 LAB — DIFFERENTIAL
Basophils Absolute: 0
Basophils Relative: 1
Eosinophils Absolute: 0.2
Eosinophils Relative: 2
Lymphocytes Relative: 44
Lymphs Abs: 3
Monocytes Absolute: 0.5
Monocytes Relative: 8
Neutro Abs: 3
Neutrophils Relative %: 45

## 2011-01-19 LAB — CBC
HCT: 44.8
Hemoglobin: 15.7
MCHC: 35
MCV: 91.6
Platelets: 207
RBC: 4.89
RDW: 13.4
WBC: 6.7

## 2011-01-19 LAB — LACTATE DEHYDROGENASE: LDH: 115

## 2011-01-23 ENCOUNTER — Other Ambulatory Visit: Payer: Self-pay | Admitting: Cardiology

## 2011-01-23 NOTE — Telephone Encounter (Signed)
Warfarin rx request 

## 2011-01-29 LAB — DIFFERENTIAL
Basophils Absolute: 0
Basophils Relative: 1
Eosinophils Absolute: 0.2
Eosinophils Relative: 3
Lymphocytes Relative: 44
Lymphs Abs: 3.2
Monocytes Absolute: 0.6
Monocytes Relative: 8
Neutro Abs: 3.3
Neutrophils Relative %: 45

## 2011-01-29 LAB — CBC
HCT: 47.9
Hemoglobin: 16.9
MCHC: 35.4
MCV: 90.7
Platelets: 216
RBC: 5.28
RDW: 13.7
WBC: 7.4

## 2011-01-29 LAB — COMPREHENSIVE METABOLIC PANEL
ALT: 18
AST: 19
Albumin: 4
Alkaline Phosphatase: 104
BUN: 13
CO2: 28
Calcium: 9.8
Chloride: 105
Creatinine, Ser: 0.92
GFR calc Af Amer: >60
GFR calc non Af Amer: >60
Glucose, Bld: 124 — ABNORMAL HIGH
Potassium: 3.9
Sodium: 141
Total Bilirubin: 0.9
Total Protein: 7.1

## 2011-01-29 LAB — LACTATE DEHYDROGENASE: LDH: 113

## 2011-02-09 ENCOUNTER — Ambulatory Visit (INDEPENDENT_AMBULATORY_CARE_PROVIDER_SITE_OTHER): Payer: Medicare Other | Admitting: *Deleted

## 2011-02-09 DIAGNOSIS — I4891 Unspecified atrial fibrillation: Secondary | ICD-10-CM

## 2011-02-09 DIAGNOSIS — Z7901 Long term (current) use of anticoagulants: Secondary | ICD-10-CM

## 2011-02-09 LAB — POCT INR: INR: 2.3

## 2011-02-16 ENCOUNTER — Encounter (HOSPITAL_COMMUNITY): Payer: Medicare Other | Attending: Oncology

## 2011-02-16 DIAGNOSIS — C8589 Other specified types of non-Hodgkin lymphoma, extranodal and solid organ sites: Secondary | ICD-10-CM | POA: Insufficient documentation

## 2011-02-16 DIAGNOSIS — J209 Acute bronchitis, unspecified: Secondary | ICD-10-CM | POA: Insufficient documentation

## 2011-02-16 LAB — DIFFERENTIAL
Basophils Absolute: 0 10*3/uL (ref 0.0–0.1)
Basophils Relative: 1 % (ref 0–1)
Eosinophils Absolute: 0.2 10*3/uL (ref 0.0–0.7)
Eosinophils Relative: 2 % (ref 0–5)
Lymphocytes Relative: 37 % (ref 12–46)
Lymphs Abs: 2.8 10*3/uL (ref 0.7–4.0)
Monocytes Absolute: 0.7 10*3/uL (ref 0.1–1.0)
Monocytes Relative: 10 % (ref 3–12)
Neutro Abs: 3.8 10*3/uL (ref 1.7–7.7)
Neutrophils Relative %: 51 % (ref 43–77)

## 2011-02-16 LAB — COMPREHENSIVE METABOLIC PANEL
ALT: 18 U/L (ref 0–53)
AST: 20 U/L (ref 0–37)
Albumin: 3.9 g/dL (ref 3.5–5.2)
Alkaline Phosphatase: 118 U/L — ABNORMAL HIGH (ref 39–117)
BUN: 10 mg/dL (ref 6–23)
CO2: 31 mEq/L (ref 19–32)
Calcium: 9.8 mg/dL (ref 8.4–10.5)
Chloride: 98 mEq/L (ref 96–112)
Creatinine, Ser: 0.78 mg/dL (ref 0.50–1.35)
GFR calc Af Amer: >90 mL/min (ref >90)
GFR calc non Af Amer: 80 mL/min — ABNORMAL LOW (ref >90)
Glucose, Bld: 161 mg/dL — ABNORMAL HIGH (ref 70–99)
Potassium: 3.9 mEq/L (ref 3.5–5.1)
Sodium: 137 mEq/L (ref 135–145)
Total Bilirubin: 0.6 mg/dL (ref 0.3–1.2)
Total Protein: 7.2 g/dL (ref 6.0–8.3)

## 2011-02-16 LAB — CBC
HCT: 46.7 % (ref 39.0–52.0)
Hemoglobin: 16.4 g/dL (ref 13.0–17.0)
MCH: 31.5 pg (ref 26.0–34.0)
MCHC: 35.1 g/dL (ref 30.0–36.0)
MCV: 89.8 fL (ref 78.0–100.0)
Platelets: 197 10*3/uL (ref 150–400)
RBC: 5.2 MIL/uL (ref 4.22–5.81)
RDW: 13.1 % (ref 11.5–15.5)
WBC: 7.5 10*3/uL (ref 4.0–10.5)

## 2011-02-16 LAB — LACTATE DEHYDROGENASE: LDH: 162 U/L (ref 94–250)

## 2011-02-16 LAB — SEDIMENTATION RATE: Sed Rate: 1 mm/hr (ref 0–16)

## 2011-02-16 NOTE — Progress Notes (Signed)
Labs drawn today for cbc/diff,cmp,ldh,esr 

## 2011-02-18 ENCOUNTER — Encounter (HOSPITAL_BASED_OUTPATIENT_CLINIC_OR_DEPARTMENT_OTHER): Payer: Medicare Other | Admitting: Oncology

## 2011-02-18 VITALS — BP 162/82 | HR 88 | Temp 97.9°F | Ht 70.0 in | Wt 202.6 lb

## 2011-02-18 DIAGNOSIS — C8589 Other specified types of non-Hodgkin lymphoma, extranodal and solid organ sites: Secondary | ICD-10-CM

## 2011-02-18 DIAGNOSIS — J209 Acute bronchitis, unspecified: Secondary | ICD-10-CM

## 2011-02-18 DIAGNOSIS — E119 Type 2 diabetes mellitus without complications: Secondary | ICD-10-CM

## 2011-02-18 DIAGNOSIS — C8590 Non-Hodgkin lymphoma, unspecified, unspecified site: Secondary | ICD-10-CM

## 2011-02-18 MED ORDER — SULFAMETHOXAZOLE-TRIMETHOPRIM 800-160 MG PO TABS
1.0000 | ORAL_TABLET | Freq: Two times a day (BID) | ORAL | Status: AC
Start: 2011-02-18 — End: 2011-02-28

## 2011-02-18 NOTE — Patient Instructions (Signed)
[facility]  Discharge Instructions  RECOMMENDATIONS MADE BY THE CONSULTANT AND ANY TEST RESULTS WILL BE SENT TO YOUR REFERRING DOCTOR.   EXAM FINDINGS BY MD TODAY AND SIGNS AND SYMPTOMS TO REPORT TO CLINIC OR PRIMARY MD: No shingles shot since you have lymphoma.  You have bronchitis and Dr. Mariel Sleet sent a prescription for an antibiotic.  You also need to use your inhaler.  If you don't have one call us.  You may be developing diabetes so see your primary care physician in a couple of weeks.  Don't change your diet but check your blood sugar before breakfast and before supper.  Make sure you let Dr. Meredith Mody see the lesion on your abdomen.  MEDICATIONS PRESCRIBED: antibiotic e-scribed to [facility]      SPECIAL INSTRUCTIONS/FOLLOW-UP: Follow-up with Family Doctor in a couple of weeks.  Lab work Needed in April and Return to Clinic after labs in April.   I acknowledge that I have been informed and understand all the instructions given to me and received a copy. I do not have any more questions at this time, but understand that I may call the Specialty Clinic at [facility] at [phone] during business hours should I have any further questions or need assistance in obtaining follow-up care.    __________________________________________  _____________  __________ Signature of Patient or Authorized Representative            Date                   Time    __________________________________________ Nurse's Signature

## 2011-02-18 NOTE — Progress Notes (Signed)
This office note has been dictated.

## 2011-02-19 NOTE — Progress Notes (Signed)
CC:   [name] D. Felecia Shelling, MD Jesse Sans Daleen Squibb, MD, [facility] Currie Paris, M.D.  DIAGNOSES: 1. Diffuse large B-cell lymphoma, clinically stage IIIA, CD20     positive, status post cervical lymph node biopsy 08/03/2003 on the     left.  PET scan was also positive in the spleen and small bowel     region, but bone marrow aspiration and biopsy were negative.  So he     essentially had stage IIIAs.  He received R-CHOP x6 cycles with CR     established by PET scan criteria on 11/23/2003 with no evidence for     relapse thus far. 2. Gastrointestinal stromal tumor, that is GIST, small bowel, 4.5 cm,     intermediate prognostic grade found on the PET scan in the small     bowel, accounting for that small bowel activity in October 2005     with resection by Dr. Jamey Ripa, thus far without recurrence. 3. Atrial fibrillation on Coumadin per Dr. Daleen Squibb. 4. Hypertension. 5. Basal cell carcinoma removed by Dr. Suan Halter in 2009. 6. Left arm basal cell carcinoma removed by Dr. Suan Halter x2 in August     2011. 7. Pigmented skin lesion, about 4 mm to 5 mm across the right upper     quadrant of the abdomen.  He is to see Dr. Albertini's group in 2     weeks. 8. Left inguinal hernia repair in the past. 9. Obesity. 10.Probable diabetes mellitus with a sugar of 161 now and that was     fasting. 11.Acute bronchitis.  Will treat him with Bactrim DS b.i.d. for 7     days, plus his inhaler which his wife thinks he has at home still. 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 13.Rectal abscess resection by Dr. Elpidio Anis in 1994. 14.Cholecystectomy in the 1970s. 15.Mild palsy while writing his signature.  HISTORY:  Legacy has been coughing up some slightly off-colored green material since Friday.  He has also been wheezing a little bit more, he states.  He has no fever, no chills.  His white count the other day was not increased.  He is not aware of night sweats, fevers, or  chills.  MEDICATIONS:  His medications have not really changed since I have seen him.  He states that he saw Dr. Felecia Shelling about 4 to 6 weeks ago just for a brief checkup, but it does not sound like he had any blood work done.  His wife is with him today.  She states he is a little forgetful at times, but wanted to know if he could have the shingles vaccine, but with his history of advanced lymphoma, he is probably immune compromised and should not have it.  PAST MEDICAL HISTORY:  Otherwise is as above.  He and his wife are still going to [place] every winter, starting in early January through the end of March.  PHYSICAL EXAMINATION:  General:  Today he is in no acute distress. Skin:  He does have a small pigmented lesion that has a slightly irregular border in the right upper quadrant of the abdomen which I did not remember from the past.  He also has a questionable skin lesion on left upper forehead, for which he is seeing Dr. Willette Brace group in the near future, so I have asked his wife to also show them the lesion in the right upper quadrant of the abdomen.  Lymph:  He has no adenopathy in the cervical,  supraclavicular, infraclavicular, axillary, or inguinal areas.  Abdomen:  He has no obvious hepatosplenomegaly.  The abdomen shows normal bowel sounds.  Heart:  Still shows this irregularly irregular rhythm, rate right around 76 and 80.  I do not hear an S3, gallop, or murmur.  Lungs:  He has bilateral lung wheezes anteriorly and posteriorly.  No distinct rubs.  There is a question of a few rales at the right base only.  1. I think Basim has an acute bronchitis which I think should be     treated because of the wheezing, sputum production, and his history     of lymphoma and we will E-scribe the Bactrim to his pharmacy, which     has been done.  He is also to use his inhaler for about 3 to 5     days.  He is to call us if he is not better. 2. Lymphoma, which appears to be in  complete remission still.  His     sedimentation rate was normal.  Lab work was normal.  LDH was     normal.  So I do not think I will do CAT scans next year.  I think     I will see him first and just watch him going forward.  Unless he     has symptoms or signs, changes on lab work, I do not think I will     repeat the CAT scans realistically since he is out 7 years. 3. From the diabetes standpoint, I think he is a diabetic with his     sugar about a year and a half ago being 131.  Then it was 141 and     now it is 161, so he is progressively rising.  He still has not     lost any weight and still eats lots of sweets, so I have asked him     to go back and see Dr. Felecia Shelling about this and we will send Dr. Felecia Shelling     a note. We will see him back in 6 months, sooner if need be.  We will do the same labs essentially, but I think he still remains in remission.    ______________________________ Ladona Horns. Mariel Sleet, MD ESN/MEDQ  D:  02/18/2011  T:  02/19/2011  Job:  [id number]

## 2011-03-03 ENCOUNTER — Ambulatory Visit (INDEPENDENT_AMBULATORY_CARE_PROVIDER_SITE_OTHER): Payer: Medicare Other | Admitting: Cardiology

## 2011-03-03 ENCOUNTER — Encounter: Payer: Self-pay | Admitting: Cardiology

## 2011-03-03 VITALS — BP 146/82 | HR 95 | Resp 16 | Ht 70.0 in | Wt 200.0 lb

## 2011-03-03 DIAGNOSIS — I4891 Unspecified atrial fibrillation: Secondary | ICD-10-CM

## 2011-03-03 DIAGNOSIS — I10 Essential (primary) hypertension: Secondary | ICD-10-CM

## 2011-03-03 NOTE — Progress Notes (Signed)
HPI [name] returns today for the evaluation and management of his A. Fib. It is chronic and rate controlled and he is on anticoagulation.  He is having no symptoms of palpitations, presyncope, syncope, chest discomfort, orthopnea, PND, edema, or any bleeding.  .PMH  .CMED  .ALL  .FH  .SOC  ROS ALL NEGATIVE EXCEPT THOSE NOTED IN HPI  PE  General Appearance: well developed, well nourished in no acute distress HEENT: symmetrical face, PERRLA, good dentition  Neck: no JVD, thyromegaly, or adenopathy, trachea midline Chest: symmetric without deformity Cardiac: PMI non-displaced, Irregular rate and rhythm normal S1, S2, no gallop or murmur Lung: clear to ausculation and percussion Vascular: all pulses full without bruits  Abdominal: nondistended, nontender, good bowel sounds, no HSM, no bruits Extremities: no cyanosis, clubbing or edema, no sign of DVT, no varicosities  Skin: normal color, no rashes Neuro: alert and oriented x 3, non-focal Pysch: normal affect  General Appearance: well developed, well nourished in no acute distress HEENT: symmetrical face, PERRLA, good dentition  Neck: no JVD, thyromegaly, or adenopathy, trachea midline Chest: symmetric without deformity Cardiac: PMI non-displaced, RRR, normal S1, S2, no gallop or murmur Lung: clear to ausculation and percussion Vascular: all pulses full without bruits  Abdominal: nondistended, nontender, good bowel sounds, no HSM, no bruits Extremities: no cyanosis, clubbing or edema, no sign of DVT, no varicosities  Skin: normal color, no rashes Neuro: alert and oriented x 3, non-focal Pysch: normal affect  EKG Atrial fib, well-controlled ventricular rate, incomplete right bundle  .BMET  .LIP  .CBC  .LFT  .INR

## 2011-03-03 NOTE — Assessment & Plan Note (Signed)
Under pretty good control. We'll not push harder because of risk of dizziness and side effects considering his age. Continue to follow with primary care.

## 2011-03-03 NOTE — Assessment & Plan Note (Signed)
Stable with good rate control and anti-coagulation.  I will see him back in 6 months. No changes made.

## 2011-03-03 NOTE — Patient Instructions (Signed)
Your physician recommends that you continue on your current medications as directed. Please refer to the Current Medication list given to you today.  Your physician recommends that you schedule a follow-up appointment in: 1 year  

## 2011-03-09 ENCOUNTER — Other Ambulatory Visit (HOSPITAL_COMMUNITY): Payer: Self-pay

## 2011-03-09 ENCOUNTER — Other Ambulatory Visit (HOSPITAL_COMMUNITY): Payer: Self-pay | Admitting: Oncology

## 2011-03-09 DIAGNOSIS — R05 Cough: Secondary | ICD-10-CM

## 2011-03-09 DIAGNOSIS — C8590 Non-Hodgkin lymphoma, unspecified, unspecified site: Secondary | ICD-10-CM

## 2011-03-09 DIAGNOSIS — J40 Bronchitis, not specified as acute or chronic: Secondary | ICD-10-CM

## 2011-03-09 DIAGNOSIS — R058 Other specified cough: Secondary | ICD-10-CM

## 2011-03-10 ENCOUNTER — Ambulatory Visit (INDEPENDENT_AMBULATORY_CARE_PROVIDER_SITE_OTHER): Payer: Medicare Other | Admitting: Urology

## 2011-03-10 ENCOUNTER — Ambulatory Visit (HOSPITAL_COMMUNITY)
Admission: RE | Admit: 2011-03-10 | Discharge: 2011-03-10 | Disposition: A | Discharge status: Home / Self Care | Payer: Medicare Other | Source: Ambulatory Visit | Attending: Oncology | Admitting: Oncology

## 2011-03-10 ENCOUNTER — Telehealth (HOSPITAL_COMMUNITY): Payer: Self-pay | Admitting: Oncology

## 2011-03-10 ENCOUNTER — Other Ambulatory Visit (HOSPITAL_COMMUNITY): Payer: Self-pay

## 2011-03-10 DIAGNOSIS — R972 Elevated prostate specific antigen [PSA]: Secondary | ICD-10-CM

## 2011-03-10 DIAGNOSIS — R310 Gross hematuria: Secondary | ICD-10-CM

## 2011-03-10 DIAGNOSIS — R05 Cough: Secondary | ICD-10-CM

## 2011-03-10 DIAGNOSIS — Z8572 Personal history of non-Hodgkin lymphomas: Secondary | ICD-10-CM

## 2011-03-10 DIAGNOSIS — N400 Enlarged prostate without lower urinary tract symptoms: Secondary | ICD-10-CM

## 2011-03-10 DIAGNOSIS — N210 Calculus in bladder: Secondary | ICD-10-CM

## 2011-03-10 DIAGNOSIS — R059 Cough, unspecified: Secondary | ICD-10-CM | POA: Insufficient documentation

## 2011-03-10 DIAGNOSIS — R058 Other specified cough: Secondary | ICD-10-CM

## 2011-03-10 DIAGNOSIS — Z87898 Personal history of other specified conditions: Secondary | ICD-10-CM | POA: Insufficient documentation

## 2011-03-10 NOTE — Telephone Encounter (Signed)
I called [name] and he is not febrile and coughing up only a very small am't of green phlegm one time in the am. His CXR was not indicative of infection and he feels better today since starting the Advair a few days ago and slept the best last night in several weeks. He is going to keep going with above and call me Monday with update-I told him I do not think he needs another antibiotic right now.

## 2011-03-23 ENCOUNTER — Ambulatory Visit (INDEPENDENT_AMBULATORY_CARE_PROVIDER_SITE_OTHER): Payer: Medicare Other | Admitting: *Deleted

## 2011-03-23 DIAGNOSIS — I4891 Unspecified atrial fibrillation: Secondary | ICD-10-CM

## 2011-03-23 DIAGNOSIS — Z7901 Long term (current) use of anticoagulants: Secondary | ICD-10-CM

## 2011-03-23 LAB — POCT INR: INR: 2.2

## 2011-04-15 ENCOUNTER — Ambulatory Visit (INDEPENDENT_AMBULATORY_CARE_PROVIDER_SITE_OTHER): Payer: Medicare Other | Admitting: *Deleted

## 2011-04-15 DIAGNOSIS — Z7901 Long term (current) use of anticoagulants: Secondary | ICD-10-CM

## 2011-04-15 DIAGNOSIS — I4891 Unspecified atrial fibrillation: Secondary | ICD-10-CM

## 2011-04-15 LAB — POCT INR: INR: 1.7

## 2011-06-10 DIAGNOSIS — I4891 Unspecified atrial fibrillation: Secondary | ICD-10-CM | POA: Diagnosis not present

## 2011-06-10 DIAGNOSIS — Z7901 Long term (current) use of anticoagulants: Secondary | ICD-10-CM | POA: Diagnosis not present

## 2011-06-11 ENCOUNTER — Ambulatory Visit (INDEPENDENT_AMBULATORY_CARE_PROVIDER_SITE_OTHER): Payer: Self-pay | Admitting: *Deleted

## 2011-06-11 DIAGNOSIS — R0989 Other specified symptoms and signs involving the circulatory and respiratory systems: Secondary | ICD-10-CM

## 2011-06-11 DIAGNOSIS — Z7901 Long term (current) use of anticoagulants: Secondary | ICD-10-CM

## 2011-06-11 DIAGNOSIS — I4891 Unspecified atrial fibrillation: Secondary | ICD-10-CM

## 2011-06-11 LAB — PROTIME-INR: INR: 1.5 — AB (ref 0.9–1.1)

## 2011-06-15 DIAGNOSIS — J329 Chronic sinusitis, unspecified: Secondary | ICD-10-CM | POA: Diagnosis not present

## 2011-06-15 DIAGNOSIS — Z6829 Body mass index (BMI) 29.0-29.9, adult: Secondary | ICD-10-CM | POA: Diagnosis not present

## 2011-06-15 DIAGNOSIS — N390 Urinary tract infection, site not specified: Secondary | ICD-10-CM | POA: Diagnosis not present

## 2011-06-15 DIAGNOSIS — Z7901 Long term (current) use of anticoagulants: Secondary | ICD-10-CM | POA: Diagnosis not present

## 2011-06-15 DIAGNOSIS — I4891 Unspecified atrial fibrillation: Secondary | ICD-10-CM | POA: Diagnosis not present

## 2011-06-25 DIAGNOSIS — I4891 Unspecified atrial fibrillation: Secondary | ICD-10-CM | POA: Diagnosis not present

## 2011-06-25 LAB — PROTIME-INR: INR: 2 — AB (ref 0.9–1.1)

## 2011-06-26 ENCOUNTER — Ambulatory Visit: Payer: Self-pay | Admitting: *Deleted

## 2011-06-26 DIAGNOSIS — Z7901 Long term (current) use of anticoagulants: Secondary | ICD-10-CM

## 2011-06-26 DIAGNOSIS — I4891 Unspecified atrial fibrillation: Secondary | ICD-10-CM

## 2011-07-01 ENCOUNTER — Other Ambulatory Visit (HOSPITAL_COMMUNITY): Payer: Self-pay | Admitting: Oncology

## 2011-07-01 ENCOUNTER — Other Ambulatory Visit: Payer: Self-pay | Admitting: Cardiology

## 2011-07-17 DIAGNOSIS — E119 Type 2 diabetes mellitus without complications: Secondary | ICD-10-CM | POA: Diagnosis not present

## 2011-07-17 DIAGNOSIS — I10 Essential (primary) hypertension: Secondary | ICD-10-CM | POA: Diagnosis not present

## 2011-07-17 DIAGNOSIS — E7800 Pure hypercholesterolemia, unspecified: Secondary | ICD-10-CM | POA: Diagnosis not present

## 2011-07-17 DIAGNOSIS — J029 Acute pharyngitis, unspecified: Secondary | ICD-10-CM | POA: Diagnosis not present

## 2011-07-17 DIAGNOSIS — J309 Allergic rhinitis, unspecified: Secondary | ICD-10-CM | POA: Diagnosis not present

## 2011-07-22 ENCOUNTER — Ambulatory Visit (INDEPENDENT_AMBULATORY_CARE_PROVIDER_SITE_OTHER): Payer: Medicare Other | Admitting: *Deleted

## 2011-07-22 DIAGNOSIS — I4891 Unspecified atrial fibrillation: Secondary | ICD-10-CM | POA: Diagnosis not present

## 2011-07-22 DIAGNOSIS — Z7901 Long term (current) use of anticoagulants: Secondary | ICD-10-CM | POA: Diagnosis not present

## 2011-07-22 LAB — POCT INR: INR: 1.6

## 2011-07-23 DIAGNOSIS — C44319 Basal cell carcinoma of skin of other parts of face: Secondary | ICD-10-CM | POA: Diagnosis not present

## 2011-07-29 DIAGNOSIS — H2510 Age-related nuclear cataract, unspecified eye: Secondary | ICD-10-CM | POA: Diagnosis not present

## 2011-07-29 DIAGNOSIS — E109 Type 1 diabetes mellitus without complications: Secondary | ICD-10-CM | POA: Diagnosis not present

## 2011-08-05 ENCOUNTER — Ambulatory Visit (INDEPENDENT_AMBULATORY_CARE_PROVIDER_SITE_OTHER): Payer: Medicare Other | Admitting: *Deleted

## 2011-08-05 ENCOUNTER — Other Ambulatory Visit: Payer: Self-pay | Admitting: Pharmacist

## 2011-08-05 DIAGNOSIS — I4891 Unspecified atrial fibrillation: Secondary | ICD-10-CM | POA: Diagnosis not present

## 2011-08-05 DIAGNOSIS — Z7901 Long term (current) use of anticoagulants: Secondary | ICD-10-CM | POA: Diagnosis not present

## 2011-08-05 LAB — POCT INR: INR: 2.8

## 2011-08-05 MED ORDER — WARFARIN SODIUM 2.5 MG PO TABS: ORAL_TABLET | ORAL | Status: DC

## 2011-08-07 DIAGNOSIS — H2510 Age-related nuclear cataract, unspecified eye: Secondary | ICD-10-CM | POA: Diagnosis not present

## 2011-08-12 DIAGNOSIS — E119 Type 2 diabetes mellitus without complications: Secondary | ICD-10-CM | POA: Diagnosis not present

## 2011-08-12 DIAGNOSIS — E1149 Type 2 diabetes mellitus with other diabetic neurological complication: Secondary | ICD-10-CM | POA: Diagnosis not present

## 2011-08-17 ENCOUNTER — Encounter (HOSPITAL_COMMUNITY): Payer: Medicare Other | Attending: Oncology

## 2011-08-17 DIAGNOSIS — J209 Acute bronchitis, unspecified: Secondary | ICD-10-CM | POA: Diagnosis not present

## 2011-08-17 DIAGNOSIS — I10 Essential (primary) hypertension: Secondary | ICD-10-CM | POA: Insufficient documentation

## 2011-08-17 DIAGNOSIS — R197 Diarrhea, unspecified: Secondary | ICD-10-CM | POA: Insufficient documentation

## 2011-08-17 DIAGNOSIS — Z85828 Personal history of other malignant neoplasm of skin: Secondary | ICD-10-CM | POA: Diagnosis not present

## 2011-08-17 DIAGNOSIS — Z87898 Personal history of other specified conditions: Secondary | ICD-10-CM | POA: Insufficient documentation

## 2011-08-17 DIAGNOSIS — E669 Obesity, unspecified: Secondary | ICD-10-CM | POA: Insufficient documentation

## 2011-08-17 DIAGNOSIS — C8590 Non-Hodgkin lymphoma, unspecified, unspecified site: Secondary | ICD-10-CM

## 2011-08-17 DIAGNOSIS — Z7901 Long term (current) use of anticoagulants: Secondary | ICD-10-CM | POA: Insufficient documentation

## 2011-08-17 DIAGNOSIS — E119 Type 2 diabetes mellitus without complications: Secondary | ICD-10-CM | POA: Diagnosis not present

## 2011-08-17 DIAGNOSIS — L989 Disorder of the skin and subcutaneous tissue, unspecified: Secondary | ICD-10-CM | POA: Insufficient documentation

## 2011-08-17 DIAGNOSIS — Z85028 Personal history of other malignant neoplasm of stomach: Secondary | ICD-10-CM | POA: Insufficient documentation

## 2011-08-17 DIAGNOSIS — Z09 Encounter for follow-up examination after completed treatment for conditions other than malignant neoplasm: Secondary | ICD-10-CM | POA: Diagnosis not present

## 2011-08-17 DIAGNOSIS — I4891 Unspecified atrial fibrillation: Secondary | ICD-10-CM | POA: Insufficient documentation

## 2011-08-17 LAB — DIFFERENTIAL
Basophils Absolute: 0 10*3/uL (ref 0.0–0.1)
Basophils Relative: 1 % (ref 0–1)
Eosinophils Absolute: 0.2 10*3/uL (ref 0.0–0.7)
Eosinophils Relative: 3 % (ref 0–5)
Lymphocytes Relative: 42 % (ref 12–46)
Lymphs Abs: 2.8 10*3/uL (ref 0.7–4.0)
Monocytes Absolute: 0.6 10*3/uL (ref 0.1–1.0)
Monocytes Relative: 9 % (ref 3–12)
Neutro Abs: 3.1 10*3/uL (ref 1.7–7.7)
Neutrophils Relative %: 46 % (ref 43–77)

## 2011-08-17 LAB — COMPREHENSIVE METABOLIC PANEL
ALT: 21 U/L (ref 0–53)
AST: 22 U/L (ref 0–37)
Albumin: 3.8 g/dL (ref 3.5–5.2)
Alkaline Phosphatase: 78 U/L (ref 39–117)
BUN: 13 mg/dL (ref 6–23)
CO2: 31 mEq/L (ref 19–32)
Calcium: 10 mg/dL (ref 8.4–10.5)
Chloride: 98 mEq/L (ref 96–112)
Creatinine, Ser: 0.74 mg/dL (ref 0.50–1.35)
GFR calc Af Amer: >90 mL/min (ref >90)
GFR calc non Af Amer: 82 mL/min — ABNORMAL LOW (ref >90)
Glucose, Bld: 146 mg/dL — ABNORMAL HIGH (ref 70–99)
Potassium: 4.2 mEq/L (ref 3.5–5.1)
Sodium: 137 mEq/L (ref 135–145)
Total Bilirubin: 0.9 mg/dL (ref 0.3–1.2)
Total Protein: 7.3 g/dL (ref 6.0–8.3)

## 2011-08-17 LAB — CBC
HCT: 45.1 % (ref 39.0–52.0)
Hemoglobin: 16.2 g/dL (ref 13.0–17.0)
MCH: 31.7 pg (ref 26.0–34.0)
MCHC: 35.9 g/dL (ref 30.0–36.0)
MCV: 88.3 fL (ref 78.0–100.0)
Platelets: 173 10*3/uL (ref 150–400)
RBC: 5.11 MIL/uL (ref 4.22–5.81)
RDW: 13.1 % (ref 11.5–15.5)
WBC: 6.7 10*3/uL (ref 4.0–10.5)

## 2011-08-17 LAB — SEDIMENTATION RATE: Sed Rate: 3 mm/hr (ref 0–16)

## 2011-08-17 LAB — LACTATE DEHYDROGENASE: LDH: 159 U/L (ref 94–250)

## 2011-08-17 NOTE — Progress Notes (Signed)
Lab draw

## 2011-08-18 ENCOUNTER — Encounter (HOSPITAL_COMMUNITY): Payer: Self-pay | Admitting: Oncology

## 2011-08-18 ENCOUNTER — Ambulatory Visit (HOSPITAL_COMMUNITY): Payer: Medicare Other | Admitting: Oncology

## 2011-08-18 ENCOUNTER — Encounter (HOSPITAL_BASED_OUTPATIENT_CLINIC_OR_DEPARTMENT_OTHER): Payer: Medicare Other | Admitting: Oncology

## 2011-08-18 VITALS — BP 145/88 | HR 97 | Temp 96.8°F | Ht 70.0 in | Wt 196.7 lb

## 2011-08-18 DIAGNOSIS — C8589 Other specified types of non-Hodgkin lymphoma, extranodal and solid organ sites: Secondary | ICD-10-CM | POA: Diagnosis not present

## 2011-08-18 DIAGNOSIS — C49A0 Gastrointestinal stromal tumor, unspecified site: Secondary | ICD-10-CM

## 2011-08-18 DIAGNOSIS — E119 Type 2 diabetes mellitus without complications: Secondary | ICD-10-CM | POA: Diagnosis not present

## 2011-08-18 DIAGNOSIS — C8590 Non-Hodgkin lymphoma, unspecified, unspecified site: Secondary | ICD-10-CM

## 2011-08-18 DIAGNOSIS — C179 Malignant neoplasm of small intestine, unspecified: Secondary | ICD-10-CM | POA: Diagnosis not present

## 2011-08-18 NOTE — Progress Notes (Signed)
Avon Gully, MD, MD [address] [place] [address]  1. NHL (non-Hodgkin's lymphoma)  CBC, Differential, Comprehensive metabolic panel, Lactate dehydrogenase, Sedimentation rate  2. GIST (gastrointestinal stromal tumor), malignant      CURRENT THERAPY: S/P R-CHOP x 6 cycles completing therapy as of 11/23/2003  INTERVAL HISTORY: KIDUS DELMAN 78 y.o. male returns for  regular  visit for followup of Diffuse large B-cell lymphoma, clinically stage IIIA, CD20 positive, status post cervical lymph node biopsy 08/03/2003 on the left. PET scan was also positive in the spleen and small bowel region, but bone marrow aspiration and biopsy were negative. So he essentially had stage IIIAs. He received R-CHOP x6 cycles with CR established by PET scan criteria on 11/23/2003 with no evidence for relapse thus far.   The patient is doing very well.  He denies any B symptoms including fevers, chills, night sweats, change in appetite, and weight loss.   He reports that Dr. Felecia Shelling has placed him on Metformin for his hyperglycemia.  He is tolerating this medication well.  He does report some diarrhea, but this is manageable.  I personally reviewed and went over laboratory results with the patient.  I have provided him with copies of it for him to have.  Past Medical History  Diagnosis Date  . Hypertension   . Atrial fibrillation   . NHL (non-Hodgkin's lymphoma) 08/18/2011    Diffuse large B-cell lymphoma, clinically stage IIIA, CD20 positive, status post cervical lymph node biopsy 08/03/2003 on the left. PET scan was also positive in the spleen and small bowel region, but bone marrow aspiration and biopsy were negative. So he essentially had stage IIIAs. He received R-CHOP x6 cycles with CR established by PET scan criteria on 11/23/2003 with no evidence for relapse th  . GIST (gastrointestinal stromal tumor), malignant 08/18/2011    Gastrointestinal stromal tumor, that is GIST, small bowel, 4.5 cm,  intermediate prognostic grade found on the PET scan in the small bowel, accounting for that small bowel activity in October 2005 with resection by Dr. Jamey Ripa, thus far without recurrence.     has HYPERTENSION, UNSPECIFIED; ATRIAL FIBRILLATION; Encounter for long-term (current) use of anticoagulants; NHL (non-Hodgkin's lymphoma); and GIST (gastrointestinal stromal tumor), malignant on his problem list.      has no known allergies.  Mr. Vossler does not currently have medications on file.  Past Surgical History  Procedure Date  . Cholecystectomy   . Incision and drainage perirectal abscess     Denies any headaches, dizziness, double vision, fevers, chills, night sweats, nausea, vomiting, diarrhea, constipation, chest pain, heart palpitations, shortness of breath, blood in stool, black tarry stool, urinary pain, urinary burning, urinary frequency, hematuria.   PHYSICAL EXAMINATION  ECOG PERFORMANCE STATUS: 1 - Symptomatic but completely ambulatory  Filed Vitals:   08/18/11 0858  BP: 145/88  Pulse: 97  Temp: 96.8 F (36 C)    GENERAL:alert, no distress, well nourished, well developed, comfortable, cooperative, obese and smiling SKIN: skin color, texture, turgor are normal, no rashes or significant lesions HEAD: Normocephalic, No masses, lesions, tenderness or abnormalities EYES: normal, EOMI, Conjunctiva are pink and non-injected EARS: External ears normal OROPHARYNX:lips, buccal mucosa, and tongue normal and mucous membranes are moist  NECK: supple, no adenopathy, thyroid normal size, non-tender, without nodularity, no stridor, non-tender, trachea midline LYMPH:  no palpable lymphadenopathy, no hepatosplenomegaly BREAST:not examined LUNGS: clear to auscultation and percussion HEART: irregularly irregular, no murmurs, no gallops, S1 normal and S2 normal ABDOMEN:abdomen soft, non-tender, obese, normal  bowel sounds, no masses or organomegaly and no hepatosplenomegaly BACK: Back  symmetric, no curvature., No CVA tenderness EXTREMITIES:less then 2 second capillary refill, no joint deformities, effusion, or inflammation, no edema, no skin discoloration, no clubbing, no cyanosis  NEURO: alert & oriented x 3 with fluent speech, no focal motor/sensory deficits, gait normal   LABORATORY DATA: CBC    Component Value Date/Time   WBC 6.7 08/17/2011 0845   RBC 5.11 08/17/2011 0845   HGB 16.2 08/17/2011 0845   HCT 45.1 08/17/2011 0845   PLT 173 08/17/2011 0845   MCV 88.3 08/17/2011 0845   MCH 31.7 08/17/2011 0845   MCHC 35.9 08/17/2011 0845   RDW 13.1 08/17/2011 0845   LYMPHSABS 2.8 08/17/2011 0845   MONOABS 0.6 08/17/2011 0845   EOSABS 0.2 08/17/2011 0845   BASOSABS 0.0 08/17/2011 0845      Chemistry      Component Value Date/Time   NA 137 08/17/2011 0845   K 4.2 08/17/2011 0845   CL 98 08/17/2011 0845   CO2 31 08/17/2011 0845   BUN 13 08/17/2011 0845   CREATININE 0.74 08/17/2011 0845      Component Value Date/Time   CALCIUM 10.0 08/17/2011 0845   ALKPHOS 78 08/17/2011 0845   AST 22 08/17/2011 0845   ALT 21 08/17/2011 0845   BILITOT 0.9 08/17/2011 0845     Results for RITTER, HELSLEY (MRN [id number]) as of 08/18/2011 09:36  Ref. Range 08/17/2011 08:45  LDH Latest Range: 94-250 U/L 159    Results for PHYLLIP, CLAW (MRN [id number]) as of 08/18/2011 09:36  Ref. Range 08/17/2011 08:45  Sed Rate Latest Range: 0-16 mm/hr 3      ASSESSMENT:  1. Diffuse large B-cell lymphoma, clinically stage IIIA, CD20 positive, status post cervical lymph node biopsy 08/03/2003 on the left. PET scan was also positive in the spleen and small bowel region, but bone marrow aspiration and biopsy were negative. So he essentially had stage IIIAs. He received R-CHOP x6 cycles with CR established by PET scan criteria on 11/23/2003 with no evidence for relapse thus far.  2. Gastrointestinal stromal tumor, that is GIST, small bowel, 4.5 cm, intermediate prognostic grade found on the PET scan in the small  bowel, accounting for that small bowel activity in October 2005 with resection by Dr. Jamey Ripa, thus far without recurrence.  3. Atrial fibrillation on Coumadin per Dr. Daleen Squibb.  4. Hypertension.  5. Basal cell carcinoma removed by Dr. Suan Halter in 2009.  6. Left arm basal cell carcinoma removed by Dr. Suan Halter x2 in August 2011.  7. Pigmented skin lesion, about 4 mm to 5 mm across the right upper quadrant of the abdomen. He is seeing Dr. Willette Brace. 8. Left inguinal hernia repair in the past.  9. Obesity.  10. Diabetes Mellitus, on metformin.  11. Acute bronchitis, resolved.  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  13.Rectal abscess resection by Dr. Elpidio Anis in 1994.  14.Cholecystectomy in the 1970s.  15.Mild palsy while writing his signature.   PLAN:  1. I personally reviewed and went over laboratory results with the patient. 2. Will not pursue imaging studies unless indicated with any issues.  3. I printed the patient a few copies of his lab work at his request to have for his records.  4. Lab work in 6 months: CBC diff, CMET, LDH, ESR 5. Return in 6 months for follow-up.   All questions were answered. The patient knows to call the  clinic with any problems, questions or concerns. We can certainly see the patient much sooner if necessary.  Hanadi Stanly

## 2011-08-18 NOTE — Patient Instructions (Signed)
[facility]  Discharge Instructions  RECOMMENDATIONS MADE BY THE CONSULTANT AND ANY TEST RESULTS WILL BE SENT TO YOUR REFERRING DOCTOR.   EXAM FINDINGS BY MD TODAY AND SIGNS AND SYMPTOMS TO REPORT TO CLINIC OR PRIMARY MD: You are doing great. We will see you back in 6 months with labs before your visit   INSTRUCTIONS GIVEN AND DISCUSSED: Notify us of any changes like new lumps or bumps or night sweats.      I acknowledge that I have been informed and understand all the instructions given to me and received a copy. I do not have any more questions at this time, but understand that I may call the Specialty Clinic at [facility] at [phone] during business hours should I have any further questions or need assistance in obtaining follow-up care.    __________________________________________  _____________  __________ Signature of Patient or Authorized Representative            Date                   Time    __________________________________________ Nurse's Signature

## 2011-08-26 ENCOUNTER — Other Ambulatory Visit (HOSPITAL_COMMUNITY): Payer: Self-pay | Admitting: Oncology

## 2011-09-02 ENCOUNTER — Ambulatory Visit (INDEPENDENT_AMBULATORY_CARE_PROVIDER_SITE_OTHER): Payer: Medicare Other | Admitting: *Deleted

## 2011-09-02 DIAGNOSIS — Z7901 Long term (current) use of anticoagulants: Secondary | ICD-10-CM

## 2011-09-02 DIAGNOSIS — I4891 Unspecified atrial fibrillation: Secondary | ICD-10-CM | POA: Diagnosis not present

## 2011-09-02 LAB — POCT INR: INR: 2.5

## 2011-09-30 ENCOUNTER — Ambulatory Visit (INDEPENDENT_AMBULATORY_CARE_PROVIDER_SITE_OTHER): Payer: Medicare Other | Admitting: *Deleted

## 2011-09-30 DIAGNOSIS — Z7901 Long term (current) use of anticoagulants: Secondary | ICD-10-CM | POA: Diagnosis not present

## 2011-09-30 DIAGNOSIS — I4891 Unspecified atrial fibrillation: Secondary | ICD-10-CM

## 2011-09-30 LAB — POCT INR: INR: 3

## 2011-10-07 ENCOUNTER — Other Ambulatory Visit (HOSPITAL_COMMUNITY): Payer: Medicare Other

## 2011-10-21 DIAGNOSIS — E119 Type 2 diabetes mellitus without complications: Secondary | ICD-10-CM | POA: Diagnosis not present

## 2011-10-21 DIAGNOSIS — E1149 Type 2 diabetes mellitus with other diabetic neurological complication: Secondary | ICD-10-CM | POA: Diagnosis not present

## 2011-10-28 ENCOUNTER — Ambulatory Visit (INDEPENDENT_AMBULATORY_CARE_PROVIDER_SITE_OTHER): Payer: Medicare Other | Admitting: *Deleted

## 2011-10-28 DIAGNOSIS — I4891 Unspecified atrial fibrillation: Secondary | ICD-10-CM

## 2011-10-28 DIAGNOSIS — Z7901 Long term (current) use of anticoagulants: Secondary | ICD-10-CM

## 2011-10-28 LAB — POCT INR: INR: 2.2

## 2011-12-02 ENCOUNTER — Other Ambulatory Visit: Payer: Self-pay

## 2011-12-02 MED ORDER — DILTIAZEM HCL ER COATED BEADS 180 MG PO CP24: 180.0000 mg | ORAL_CAPSULE | Freq: Every day | ORAL | Status: DC

## 2011-12-09 ENCOUNTER — Ambulatory Visit (INDEPENDENT_AMBULATORY_CARE_PROVIDER_SITE_OTHER): Payer: Medicare Other | Admitting: *Deleted

## 2011-12-09 DIAGNOSIS — Z7901 Long term (current) use of anticoagulants: Secondary | ICD-10-CM

## 2011-12-09 DIAGNOSIS — I4891 Unspecified atrial fibrillation: Secondary | ICD-10-CM | POA: Diagnosis not present

## 2011-12-09 LAB — POCT INR: INR: 2.6

## 2011-12-30 DIAGNOSIS — E1149 Type 2 diabetes mellitus with other diabetic neurological complication: Secondary | ICD-10-CM | POA: Diagnosis not present

## 2011-12-30 DIAGNOSIS — E119 Type 2 diabetes mellitus without complications: Secondary | ICD-10-CM | POA: Diagnosis not present

## 2012-01-07 ENCOUNTER — Encounter (HOSPITAL_COMMUNITY): Payer: Medicare Other | Attending: Oncology

## 2012-01-07 DIAGNOSIS — C8589 Other specified types of non-Hodgkin lymphoma, extranodal and solid organ sites: Secondary | ICD-10-CM | POA: Diagnosis not present

## 2012-01-07 DIAGNOSIS — E119 Type 2 diabetes mellitus without complications: Secondary | ICD-10-CM | POA: Diagnosis not present

## 2012-01-07 DIAGNOSIS — I4891 Unspecified atrial fibrillation: Secondary | ICD-10-CM | POA: Diagnosis not present

## 2012-01-07 DIAGNOSIS — C8590 Non-Hodgkin lymphoma, unspecified, unspecified site: Secondary | ICD-10-CM

## 2012-01-07 LAB — COMPREHENSIVE METABOLIC PANEL
ALT: 17 U/L (ref 0–53)
AST: 18 U/L (ref 0–37)
Albumin: 3.8 g/dL (ref 3.5–5.2)
Alkaline Phosphatase: 88 U/L (ref 39–117)
BUN: 17 mg/dL (ref 6–23)
CO2: 30 mEq/L (ref 19–32)
Calcium: 9.9 mg/dL (ref 8.4–10.5)
Chloride: 101 mEq/L (ref 96–112)
Creatinine, Ser: 0.8 mg/dL (ref 0.50–1.35)
GFR calc Af Amer: >90 mL/min (ref >90)
GFR calc non Af Amer: 79 mL/min — ABNORMAL LOW (ref >90)
Glucose, Bld: 139 mg/dL — ABNORMAL HIGH (ref 70–99)
Potassium: 4.1 mEq/L (ref 3.5–5.1)
Sodium: 137 mEq/L (ref 135–145)
Total Bilirubin: 0.8 mg/dL (ref 0.3–1.2)
Total Protein: 7.1 g/dL (ref 6.0–8.3)

## 2012-01-07 LAB — CBC
HCT: 46.4 % (ref 39.0–52.0)
Hemoglobin: 16.6 g/dL (ref 13.0–17.0)
MCH: 31.4 pg (ref 26.0–34.0)
MCHC: 35.8 g/dL (ref 30.0–36.0)
MCV: 87.7 fL (ref 78.0–100.0)
Platelets: 175 10*3/uL (ref 150–400)
RBC: 5.29 MIL/uL (ref 4.22–5.81)
RDW: 13.8 % (ref 11.5–15.5)
WBC: 6.5 10*3/uL (ref 4.0–10.5)

## 2012-01-07 LAB — DIFFERENTIAL
Basophils Absolute: 0 10*3/uL (ref 0.0–0.1)
Basophils Relative: 1 % (ref 0–1)
Eosinophils Absolute: 0.2 10*3/uL (ref 0.0–0.7)
Eosinophils Relative: 3 % (ref 0–5)
Lymphocytes Relative: 42 % (ref 12–46)
Lymphs Abs: 2.7 10*3/uL (ref 0.7–4.0)
Monocytes Absolute: 0.5 10*3/uL (ref 0.1–1.0)
Monocytes Relative: 8 % (ref 3–12)
Neutro Abs: 3 10*3/uL (ref 1.7–7.7)
Neutrophils Relative %: 47 % (ref 43–77)

## 2012-01-07 LAB — SEDIMENTATION RATE: Sed Rate: 0 mm/hr (ref 0–16)

## 2012-01-07 LAB — LACTATE DEHYDROGENASE: LDH: 148 U/L (ref 94–250)

## 2012-01-07 NOTE — Progress Notes (Signed)
Labs drawn today for cbc/diff,cmp,ldh,esr 

## 2012-01-08 ENCOUNTER — Encounter (HOSPITAL_BASED_OUTPATIENT_CLINIC_OR_DEPARTMENT_OTHER): Payer: Medicare Other | Admitting: Oncology

## 2012-01-08 ENCOUNTER — Encounter (HOSPITAL_COMMUNITY): Payer: Self-pay | Admitting: Oncology

## 2012-01-08 VITALS — BP 151/89 | HR 97 | Temp 97.2°F | Resp 18 | Wt 190.1 lb

## 2012-01-08 DIAGNOSIS — L989 Disorder of the skin and subcutaneous tissue, unspecified: Secondary | ICD-10-CM

## 2012-01-08 DIAGNOSIS — D481 Neoplasm of uncertain behavior of connective and other soft tissue: Secondary | ICD-10-CM

## 2012-01-08 DIAGNOSIS — I4891 Unspecified atrial fibrillation: Secondary | ICD-10-CM | POA: Diagnosis not present

## 2012-01-08 DIAGNOSIS — C8590 Non-Hodgkin lymphoma, unspecified, unspecified site: Secondary | ICD-10-CM

## 2012-01-08 DIAGNOSIS — C8589 Other specified types of non-Hodgkin lymphoma, extranodal and solid organ sites: Secondary | ICD-10-CM

## 2012-01-08 NOTE — Patient Instructions (Addendum)
[facility]  Discharge Instructions  RECOMMENDATIONS MADE BY THE CONSULTANT AND ANY TEST RESULTS WILL BE SENT TO YOUR REFERRING DOCTOR.   EXAM FINDINGS BY MD TODAY AND SIGNS AND SYMPTOMS TO REPORT TO CLINIC OR PRIMARY MD: exam and discussion per MD.  You are doing well.  Report any new lumps, night sweats, infections, etc.  MEDICATIONS PRESCRIBED: none     SPECIAL INSTRUCTIONS/FOLLOW-UP: Lab work Needed in April and Return to Clinic in April.   I acknowledge that I have been informed and understand all the instructions given to me and received a copy. I do not have any more questions at this time, but understand that I may call the Specialty Clinic at [facility] at [phone] during business hours should I have any further questions or need assistance in obtaining follow-up care.    __________________________________________  _____________  __________ Signature of Patient or Authorized Representative            Date                   Time    __________________________________________ Nurse's Signature

## 2012-01-08 NOTE — Progress Notes (Signed)
Problem #1 diffuse large B-cell lymphoma clinically stage IIIAS status post cervical lymph node biopsy in 08/03/2003 for a CD20 positive lymphoma with PET scan showing positivity in the spleen but bone marrow aspiration and biopsy were negative. He received R. CHOP x6 cycles with a complete remission established by PET scan criteria on 11/23/2003 and he has no evidence recurrence thus far. He remains asymptomatic today. He has no B. Symptomatology. 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 Jamey Ripa. That was resected in October 2005 Problem #3 atrial fibrillation on Coumadin Problem #4 lesion in the scalp at the hairline worrisome for a skin cancer and he will see Dr. Margo Aye at his convenience. Problem #5 basal cell carcinoma in the past in the 2009 Problem #6 basal cell carcinoma left arm in August 2011 Problem #7 diabetes mellitus under better control Problem #8 obesity weighing 190 pounds Though this has improved with his old weight  well above 200 pounds in the past. Problem #9 history of hematospermia   Joandry doing well asymptomatic and review of systems is negative. The only positive review of systems is the skin lesion mentioned above and he needs to see Dr. Margo Aye. His physical exam shows stable vital signs except his weight is down a few more pounds which is excellent. Lymph nodes are negative throughout. He has no hepatosplenomegaly. Lungs are clear. Heart shows at this time fairly regular rhythm without S3 gallop or murmur. He has no arm edema or leg edema. The skin lesion at the scalp line is 8-9 mm across with irregular borders slightly scaly but of some concern for a skin cancer. There is a 2-3 mm area lateral to this which also has an irregular surface and needs to be seen as well.   His lab work is otherwise excellent with a sugar 139 yesterday. His wife is with him today who has some recent  concerns with her health we talked about them but she will continue followup with her primary care physician.

## 2012-01-17 ENCOUNTER — Other Ambulatory Visit: Payer: Self-pay | Admitting: Cardiology

## 2012-01-18 DIAGNOSIS — L570 Actinic keratosis: Secondary | ICD-10-CM | POA: Diagnosis not present

## 2012-01-18 DIAGNOSIS — Z85828 Personal history of other malignant neoplasm of skin: Secondary | ICD-10-CM | POA: Diagnosis not present

## 2012-01-18 DIAGNOSIS — D235 Other benign neoplasm of skin of trunk: Secondary | ICD-10-CM | POA: Diagnosis not present

## 2012-01-20 ENCOUNTER — Ambulatory Visit (INDEPENDENT_AMBULATORY_CARE_PROVIDER_SITE_OTHER): Payer: Medicare Other | Admitting: *Deleted

## 2012-01-20 DIAGNOSIS — I4891 Unspecified atrial fibrillation: Secondary | ICD-10-CM

## 2012-01-20 DIAGNOSIS — Z7901 Long term (current) use of anticoagulants: Secondary | ICD-10-CM

## 2012-01-20 LAB — POCT INR: INR: 3.1

## 2012-01-26 DIAGNOSIS — Z23 Encounter for immunization: Secondary | ICD-10-CM | POA: Diagnosis not present

## 2012-02-24 ENCOUNTER — Ambulatory Visit (INDEPENDENT_AMBULATORY_CARE_PROVIDER_SITE_OTHER): Payer: Medicare Other | Admitting: *Deleted

## 2012-02-24 DIAGNOSIS — Z7901 Long term (current) use of anticoagulants: Secondary | ICD-10-CM | POA: Diagnosis not present

## 2012-02-24 DIAGNOSIS — I4891 Unspecified atrial fibrillation: Secondary | ICD-10-CM | POA: Diagnosis not present

## 2012-02-24 LAB — POCT INR: INR: 3

## 2012-03-08 ENCOUNTER — Ambulatory Visit: Payer: Medicare Other | Admitting: Urology

## 2012-03-09 ENCOUNTER — Encounter: Payer: Self-pay | Admitting: Cardiology

## 2012-03-09 ENCOUNTER — Ambulatory Visit (INDEPENDENT_AMBULATORY_CARE_PROVIDER_SITE_OTHER): Payer: Medicare Other | Admitting: Cardiology

## 2012-03-09 VITALS — BP 142/86 | HR 74 | Ht 71.0 in | Wt 191.0 lb

## 2012-03-09 DIAGNOSIS — E1149 Type 2 diabetes mellitus with other diabetic neurological complication: Secondary | ICD-10-CM | POA: Diagnosis not present

## 2012-03-09 DIAGNOSIS — I4891 Unspecified atrial fibrillation: Secondary | ICD-10-CM | POA: Diagnosis not present

## 2012-03-09 DIAGNOSIS — C8589 Other specified types of non-Hodgkin lymphoma, extranodal and solid organ sites: Secondary | ICD-10-CM | POA: Diagnosis not present

## 2012-03-09 DIAGNOSIS — C8590 Non-Hodgkin lymphoma, unspecified, unspecified site: Secondary | ICD-10-CM

## 2012-03-09 DIAGNOSIS — E119 Type 2 diabetes mellitus without complications: Secondary | ICD-10-CM | POA: Diagnosis not present

## 2012-03-09 MED ORDER — DILTIAZEM HCL ER COATED BEADS 180 MG PO CP24: 180.0000 mg | ORAL_CAPSULE | Freq: Every day | ORAL | Status: DC

## 2012-03-09 MED ORDER — LISINOPRIL-HYDROCHLOROTHIAZIDE 20-12.5 MG PO TABS: 1.0000 | ORAL_TABLET | Freq: Every day | ORAL | Status: DC

## 2012-03-09 MED ORDER — WARFARIN SODIUM 2.5 MG PO TABS: 2.5000 mg | ORAL_TABLET | Freq: Every day | ORAL | Status: DC

## 2012-03-09 MED ORDER — FINASTERIDE 5 MG PO TABS: 5.0000 mg | ORAL_TABLET | Freq: Every day | ORAL | Status: DC

## 2012-03-09 NOTE — Patient Instructions (Addendum)
Your physician recommends that you schedule a follow-up appointment in: 6 months  

## 2012-03-09 NOTE — Progress Notes (Signed)
HPI [name] returns today for evaluation and management of his chronic A. fib and anticoagulation. As usual, he offers no complaints. He specifically denies palpitations, chest pain, presyncope or syncope, or any bleeding from Coumadin.  Past Medical History  Diagnosis Date  . Hypertension   . Atrial fibrillation   . NHL (non-Hodgkin's lymphoma) 08/18/2011    Diffuse large B-cell lymphoma, clinically stage IIIA, CD20 positive, status post cervical lymph node biopsy 08/03/2003 on the left. PET scan was also positive in the spleen and small bowel region, but bone marrow aspiration and biopsy were negative. So he essentially had stage IIIAs. He received R-CHOP x6 cycles with CR established by PET scan criteria on 11/23/2003 with no evidence for relapse th  . 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 Jamey Foster, thus far without recurrence.     Current Outpatient Prescriptions  Medication Sig Dispense Refill  . acetaminophen (TYLENOL) 500 MG tablet Take 500 mg by mouth every 6 (six) hours as needed.      . ALBUTEROL IN Inhale into the lungs as needed.        Marland Kitchen amitriptyline (ELAVIL) 50 MG tablet Take 25 mg by mouth at bedtime.       . Cranberry 200 MG CAPS Take 1 capsule by mouth daily.        Marland Kitchen diltiazem (CARDIZEM CD) 180 MG 24 hr capsule Take 1 capsule (180 mg total) by mouth daily.  90 capsule  3  . finasteride (PROSCAR) 5 MG tablet Take 1 tablet (5 mg total) by mouth daily.  90 tablet  3  . GuaiFENesin (MUCUS RELIEF ADULT PO) Take by mouth as needed.        Marland Kitchen KLOR-CON M10 10 MEQ tablet TAKE TWO TABLETS BY MOUTH EVERY DAY  60 each  6  . linagliptin (TRADJENTA) 5 MG TABS tablet Take 5 mg by mouth daily.      Marland Kitchen lisinopril-hydrochlorothiazide (PRINZIDE,ZESTORETIC)  20-12.5 MG per tablet Take 1 tablet by mouth daily.  90 tablet  3  . omeprazole (PRILOSEC) 20 MG capsule Take 20 mg by mouth daily.        Marland Kitchen warfarin (COUMADIN) 2.5 MG tablet Take 1 tablet (2.5 mg total) by mouth daily. Taking 2 tabs on Mondays Wednesdays and Fridays  All other days takes 1 tablet Take as directed by Anticoagulation clinic  135 tablet  0  . [DISCONTINUED] diltiazem (CARDIZEM CD) 180 MG 24 hr capsule Take 1 capsule (180 mg total) by mouth daily.  90 capsule  1  . [DISCONTINUED] lisinopril-hydrochlorothiazide (PRINZIDE,ZESTORETIC) 20-12.5 MG per tablet Take 1 tablet by mouth daily.        . [DISCONTINUED] warfarin (COUMADIN) 2.5 MG tablet Taking 2 tabs on Mondays, Wednesdays and Fridays.  All other days takes 1 tablet. Take as directed by Anticoagulation clinic      . [DISCONTINUED] warfarin (COUMADIN) 2.5 MG tablet TAKE AS DIRECTED BY  ANTICOAGULATION  CLINIC  60 tablet  2  . [DISCONTINUED] warfarin (COUMADIN) 2.5 MG tablet Take 1 tablet (2.5 mg total) by mouth daily. Taking 2 tabs on Mondays, Wednesdays and Fridays.  All other days takes 1 tablet. Take as directed by Anticoagulation clinic  135 tablet  0  . [DISCONTINUED] warfarin (COUMADIN) 2.5 MG tablet Take 1 tablet (2.5  mg total) by mouth daily. Taking 2 tabs on Mondays, Wednesdays and Fridays.  All other days takes 1 tablet. Take as directed by Anticoagulation clinic  135 tablet  0    No Known Allergies  No family history on file.  History   Social History  . Marital Status: Married    Spouse Name: N/A    Number of Children: N/A  . Years of Education: N/A   Occupational History  . retired     Medical illustrator   Social History Main Topics  . Smoking status: Never Smoker   . Smokeless tobacco: Never Used  . Alcohol Use: No  . Drug Use: No  . Sexually Active: Not on file   Other Topics Concern  . Not on file   Social History Narrative  . No narrative on file    ROS ALL NEGATIVE EXCEPT THOSE NOTED IN  HPI  PE  General Appearance: well developed, well nourished in no acute distress HEENT: symmetrical face, PERRLA, good dentition  Neck: no JVD, thyromegaly, or adenopathy, trachea midline Chest: symmetric without deformity Cardiac: PMI non-displaced, irregular rate and rhythm, normal S1, S2, no gallop or murmur Lung: clear to ausculation and percussion Vascular: all pulses full without bruits  Abdominal: nondistended, nontender, good bowel sounds, no HSM, no bruits Extremities: no cyanosis, clubbing or edema, no sign of DVT, no varicosities  Skin: normal color, no rashes Neuro: alert and oriented x 3, non-focal Pysch: normal affect  EKG  BMET    Component Value Date/Time   NA 137 01/07/2012 0856   K 4.1 01/07/2012 0856   CL 101 01/07/2012 0856   CO2 30 01/07/2012 0856   GLUCOSE 139* 01/07/2012 0856   BUN 17 01/07/2012 0856   CREATININE 0.80 01/07/2012 0856   CALCIUM 9.9 01/07/2012 0856   GFRNONAA 79* 01/07/2012 0856   GFRAA >90 01/07/2012 0856    Lipid Panel     Component Value Date/Time   CHOL 145 01/20/2006   TRIG 219 01/20/2006   HDL 35 01/20/2006   LDLCALC 66 01/20/2006    CBC    Component Value Date/Time   WBC 6.5 01/07/2012 0856   RBC 5.29 01/07/2012 0856   HGB 16.6 01/07/2012 0856   HCT 46.4 01/07/2012 0856   PLT 175 01/07/2012 0856   MCV 87.7 01/07/2012 0856   MCH 31.4 01/07/2012 0856   MCHC 35.8 01/07/2012 0856   RDW 13.8 01/07/2012 0856   LYMPHSABS 2.7 01/07/2012 0856   MONOABS 0.5 01/07/2012 0856   EOSABS 0.2 01/07/2012 0856   BASOSABS 0.0 01/07/2012 0856

## 2012-03-09 NOTE — Assessment & Plan Note (Signed)
Stable. Continue rate control and anticoagulation. Return the office in 6 months or when necessary. He always likes to come with his wife.

## 2012-03-21 ENCOUNTER — Ambulatory Visit (INDEPENDENT_AMBULATORY_CARE_PROVIDER_SITE_OTHER): Payer: Medicare Other | Admitting: *Deleted

## 2012-03-21 DIAGNOSIS — Z7901 Long term (current) use of anticoagulants: Secondary | ICD-10-CM

## 2012-03-21 DIAGNOSIS — I4891 Unspecified atrial fibrillation: Secondary | ICD-10-CM

## 2012-03-21 LAB — POCT INR: INR: 3

## 2012-04-06 ENCOUNTER — Ambulatory Visit (INDEPENDENT_AMBULATORY_CARE_PROVIDER_SITE_OTHER): Payer: Medicare Other | Admitting: *Deleted

## 2012-04-06 DIAGNOSIS — Z7901 Long term (current) use of anticoagulants: Secondary | ICD-10-CM

## 2012-04-06 DIAGNOSIS — I4891 Unspecified atrial fibrillation: Secondary | ICD-10-CM | POA: Diagnosis not present

## 2012-04-06 LAB — POCT INR: INR: 2.8

## 2012-04-11 DIAGNOSIS — J410 Simple chronic bronchitis: Secondary | ICD-10-CM | POA: Diagnosis not present

## 2012-04-11 DIAGNOSIS — E119 Type 2 diabetes mellitus without complications: Secondary | ICD-10-CM | POA: Diagnosis not present

## 2012-05-06 DIAGNOSIS — I4891 Unspecified atrial fibrillation: Secondary | ICD-10-CM | POA: Diagnosis not present

## 2012-05-06 DIAGNOSIS — Z7901 Long term (current) use of anticoagulants: Secondary | ICD-10-CM | POA: Diagnosis not present

## 2012-05-09 ENCOUNTER — Ambulatory Visit (INDEPENDENT_AMBULATORY_CARE_PROVIDER_SITE_OTHER): Payer: Medicare Other | Admitting: *Deleted

## 2012-05-09 DIAGNOSIS — I4891 Unspecified atrial fibrillation: Secondary | ICD-10-CM

## 2012-05-09 DIAGNOSIS — Z7901 Long term (current) use of anticoagulants: Secondary | ICD-10-CM

## 2012-05-09 LAB — PROTIME-INR: INR: 2 — AB (ref 0.9–1.1)

## 2012-06-08 DIAGNOSIS — I4891 Unspecified atrial fibrillation: Secondary | ICD-10-CM | POA: Diagnosis not present

## 2012-06-08 DIAGNOSIS — Z7901 Long term (current) use of anticoagulants: Secondary | ICD-10-CM | POA: Diagnosis not present

## 2012-06-08 LAB — PROTIME-INR: INR: 2.1 — AB (ref 0.9–1.1)

## 2012-06-09 ENCOUNTER — Ambulatory Visit (INDEPENDENT_AMBULATORY_CARE_PROVIDER_SITE_OTHER): Payer: Medicare Other | Admitting: *Deleted

## 2012-06-09 DIAGNOSIS — Z7901 Long term (current) use of anticoagulants: Secondary | ICD-10-CM

## 2012-06-09 DIAGNOSIS — I4891 Unspecified atrial fibrillation: Secondary | ICD-10-CM

## 2012-07-06 DIAGNOSIS — Z7901 Long term (current) use of anticoagulants: Secondary | ICD-10-CM | POA: Diagnosis not present

## 2012-07-06 DIAGNOSIS — I4891 Unspecified atrial fibrillation: Secondary | ICD-10-CM | POA: Diagnosis not present

## 2012-07-06 LAB — PROTIME-INR: INR: 1.8 — AB (ref <=1.1)

## 2012-07-07 ENCOUNTER — Ambulatory Visit (INDEPENDENT_AMBULATORY_CARE_PROVIDER_SITE_OTHER): Payer: Medicare Other | Admitting: Cardiology

## 2012-07-07 DIAGNOSIS — I4891 Unspecified atrial fibrillation: Secondary | ICD-10-CM

## 2012-07-07 DIAGNOSIS — Z7901 Long term (current) use of anticoagulants: Secondary | ICD-10-CM

## 2012-07-19 DIAGNOSIS — E1149 Type 2 diabetes mellitus with other diabetic neurological complication: Secondary | ICD-10-CM | POA: Diagnosis not present

## 2012-07-19 DIAGNOSIS — E119 Type 2 diabetes mellitus without complications: Secondary | ICD-10-CM | POA: Diagnosis not present

## 2012-07-21 ENCOUNTER — Ambulatory Visit (INDEPENDENT_AMBULATORY_CARE_PROVIDER_SITE_OTHER): Payer: Medicare Other | Admitting: *Deleted

## 2012-07-21 DIAGNOSIS — J309 Allergic rhinitis, unspecified: Secondary | ICD-10-CM | POA: Diagnosis not present

## 2012-07-21 DIAGNOSIS — I4891 Unspecified atrial fibrillation: Secondary | ICD-10-CM | POA: Diagnosis not present

## 2012-07-21 DIAGNOSIS — Z7901 Long term (current) use of anticoagulants: Secondary | ICD-10-CM | POA: Diagnosis not present

## 2012-07-21 DIAGNOSIS — E119 Type 2 diabetes mellitus without complications: Secondary | ICD-10-CM | POA: Diagnosis not present

## 2012-07-21 DIAGNOSIS — J410 Simple chronic bronchitis: Secondary | ICD-10-CM | POA: Diagnosis not present

## 2012-07-21 LAB — POCT INR: INR: 2.3

## 2012-07-25 ENCOUNTER — Encounter (HOSPITAL_COMMUNITY): Payer: Medicare Other | Attending: Oncology

## 2012-07-25 DIAGNOSIS — C8589 Other specified types of non-Hodgkin lymphoma, extranodal and solid organ sites: Secondary | ICD-10-CM | POA: Diagnosis not present

## 2012-07-25 DIAGNOSIS — I10 Essential (primary) hypertension: Secondary | ICD-10-CM | POA: Diagnosis not present

## 2012-07-25 DIAGNOSIS — E119 Type 2 diabetes mellitus without complications: Secondary | ICD-10-CM | POA: Insufficient documentation

## 2012-07-25 DIAGNOSIS — I4891 Unspecified atrial fibrillation: Secondary | ICD-10-CM | POA: Insufficient documentation

## 2012-07-25 DIAGNOSIS — C8590 Non-Hodgkin lymphoma, unspecified, unspecified site: Secondary | ICD-10-CM

## 2012-07-25 LAB — CBC WITH DIFFERENTIAL/PLATELET
Basophils Absolute: 0 10*3/uL (ref 0.0–0.1)
Basophils Relative: 1 % (ref 0–1)
Eosinophils Absolute: 0.1 10*3/uL (ref 0.0–0.7)
Eosinophils Relative: 2 % (ref 0–5)
HCT: 46.2 % (ref 39.0–52.0)
Hemoglobin: 16.8 g/dL (ref 13.0–17.0)
Lymphocytes Relative: 45 % (ref 12–46)
Lymphs Abs: 3.2 10*3/uL (ref 0.7–4.0)
MCH: 31.9 pg (ref 26.0–34.0)
MCHC: 36.4 g/dL — ABNORMAL HIGH (ref 30.0–36.0)
MCV: 87.7 fL (ref 78.0–100.0)
Monocytes Absolute: 0.6 10*3/uL (ref 0.1–1.0)
Monocytes Relative: 8 % (ref 3–12)
Neutro Abs: 3.1 10*3/uL (ref 1.7–7.7)
Neutrophils Relative %: 44 % (ref 43–77)
Platelets: 180 10*3/uL (ref 150–400)
RBC: 5.27 MIL/uL (ref 4.22–5.81)
RDW: 13 % (ref 11.5–15.5)
WBC: 7 10*3/uL (ref 4.0–10.5)

## 2012-07-25 LAB — COMPREHENSIVE METABOLIC PANEL
ALT: 13 U/L (ref 0–53)
AST: 15 U/L (ref 0–37)
Albumin: 3.8 g/dL (ref 3.5–5.2)
Alkaline Phosphatase: 117 U/L (ref 39–117)
BUN: 14 mg/dL (ref 6–23)
CO2: 28 mEq/L (ref 19–32)
Calcium: 9.9 mg/dL (ref 8.4–10.5)
Chloride: 100 mEq/L (ref 96–112)
Creatinine, Ser: 0.75 mg/dL (ref 0.50–1.35)
GFR calc Af Amer: >90 mL/min (ref >90)
GFR calc non Af Amer: 81 mL/min — ABNORMAL LOW (ref >90)
Glucose, Bld: 151 mg/dL — ABNORMAL HIGH (ref 70–99)
Potassium: 3.9 mEq/L (ref 3.5–5.1)
Sodium: 138 mEq/L (ref 135–145)
Total Bilirubin: 0.5 mg/dL (ref 0.3–1.2)
Total Protein: 7.7 g/dL (ref 6.0–8.3)

## 2012-07-25 LAB — SEDIMENTATION RATE: Sed Rate: 5 mm/hr (ref 0–16)

## 2012-07-25 LAB — LACTATE DEHYDROGENASE: LDH: 175 U/L (ref 94–250)

## 2012-07-25 NOTE — Progress Notes (Signed)
Labs drawn today for cbc,cmp,ldh,sed rate

## 2012-07-26 DIAGNOSIS — E119 Type 2 diabetes mellitus without complications: Secondary | ICD-10-CM | POA: Diagnosis not present

## 2012-07-26 DIAGNOSIS — E1149 Type 2 diabetes mellitus with other diabetic neurological complication: Secondary | ICD-10-CM | POA: Diagnosis not present

## 2012-07-27 ENCOUNTER — Encounter (HOSPITAL_COMMUNITY): Payer: Self-pay | Admitting: Oncology

## 2012-07-27 ENCOUNTER — Encounter (HOSPITAL_BASED_OUTPATIENT_CLINIC_OR_DEPARTMENT_OTHER): Payer: Medicare Other | Admitting: Oncology

## 2012-07-27 VITALS — BP 172/90 | HR 98 | Temp 97.7°F | Resp 18 | Wt 193.8 lb

## 2012-07-27 DIAGNOSIS — I4891 Unspecified atrial fibrillation: Secondary | ICD-10-CM | POA: Diagnosis not present

## 2012-07-27 DIAGNOSIS — C8589 Other specified types of non-Hodgkin lymphoma, extranodal and solid organ sites: Secondary | ICD-10-CM | POA: Diagnosis not present

## 2012-07-27 DIAGNOSIS — R635 Abnormal weight gain: Secondary | ICD-10-CM

## 2012-07-27 DIAGNOSIS — C8590 Non-Hodgkin lymphoma, unspecified, unspecified site: Secondary | ICD-10-CM

## 2012-07-27 DIAGNOSIS — Z7901 Long term (current) use of anticoagulants: Secondary | ICD-10-CM | POA: Diagnosis not present

## 2012-07-27 NOTE — Patient Instructions (Addendum)
[facility] Discharge Instructions  RECOMMENDATIONS MADE BY THE CONSULTANT AND ANY TEST RESULTS WILL BE SENT TO YOUR REFERRING PHYSICIAN.  EXAM FINDINGS BY THE PHYSICIAN TODAY AND SIGNS OR SYMPTOMS TO REPORT TO CLINIC OR PRIMARY PHYSICIAN: exam and discussion by MD. Blood counts are good.  MD is concerned about your BP and we will have you come on Friday, Monday and Wednesday to have it checked.  MEDICATIONS PRESCRIBED:  none  INSTRUCTIONS GIVEN AND DISCUSSED: Report fevers, night sweats, recurring infections, etc.  SPECIAL INSTRUCTIONS/FOLLOW-UP: 6 months.  Thank you for choosing [facility] to provide your oncology and hematology care.  To afford each patient quality time with our providers, please arrive at least 15 minutes before your scheduled appointment time.  With your help, our goal is to use those 15 minutes to complete the necessary work-up to ensure our physicians have the information they need to help with your evaluation and healthcare recommendations.    Effective January 1st, 2014, we ask that you re-schedule your appointment with our physicians should you arrive 10 or more minutes late for your appointment.  We strive to give you quality time with our providers, and arriving late affects you and other patients whose appointments are after yours.    Again, thank you for choosing [facility].  Our hope is that these requests will decrease the amount of time that you wait before being seen by our physicians.       _____________________________________________________________  Should you have questions after your visit to [facility], please contact our office at [phone] between the hours of 8:30 a.m. and 5:00 p.m.  Voicemails left after 4:30 p.m. will not be returned until the following business day.  For prescription refill requests, have your pharmacy contact our office with your prescription refill request.

## 2012-07-27 NOTE — Progress Notes (Signed)
Diagnosis #1 diffuse large B-cell lymphoma clinically stage IIIAS STATUS post cervical lymph node biopsy on 08/03/2003. His lymphoma CD20 positive and PET scan showed positivity in the spleen. Bone marrow aspiration and biopsy were negative however. He received R. CHOP x6 cycles with a complete remission established by PET scan criteria on 11/23/2003. He completed 6 full cycles total. He remains without disease recurrence. He remains free of B. Symptomatology.  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 Jamey Ripa in October 2005 #3 atrial fibrillation on Coumadin #4 hypertension with a significant elevation of his blood pressure day. Initially it was 165/96. I repeated that and it was 178/84 in the right arm sitting position. Repeat in the supine position was 170/90. He states his blood pressures are never this high. He has been taking his medication a regular basis. #5 basal cell carcinoma 2009 as well as in 2011. #6 diabetes mellitus under better control. He has lost some weight compared to the past essentially gained 3 pounds since last September. #7 excessive weight still present. #8 history of hematospermia  He has no B. symptomatology. His oncology review of systems is noncontributory. Unfortunately is vital signs are with blood pressure to be higher than usual. He states his blood pressures at home are well controlled. The 3 die is that we checked today are all higher than they should be. We will therefore check his blood pressure once a day for the next 3 days and if need be refer him back to Dr. wall sooner than usual.  BP 172/90  Pulse 98  Temp(Src) 97.7 F (36.5 C) (Oral)  Resp 18  Wt 193 lb 12.8 oz (87.907 kg)  BMI 27.04 kg/m2  Lymph nodes remain negative throughout. I thought his pulse was closer to 88-92 still it regularly a regular. I did not hear an S3 gallop or murmur. His  lungs are clear. His face is slightly red from sun exposure down in [place]. He had no obvious hepatosplenomegaly. His abdomen still rotund. Bowel sounds are normal. He has no peripheral edema. He is in no acute distress.  His lab work was excellent we will see him in October but we will check his blood pressures and then see if he needs to be seen sooner by his cardiologist.  Thus far he remains free of any evidence of recurrence of his non-Hodgkin's lymphoma.

## 2012-07-29 ENCOUNTER — Encounter (HOSPITAL_COMMUNITY): Payer: Medicare Other

## 2012-07-29 ENCOUNTER — Encounter (HOSPITAL_COMMUNITY): Payer: Self-pay | Admitting: *Deleted

## 2012-07-29 DIAGNOSIS — E119 Type 2 diabetes mellitus without complications: Secondary | ICD-10-CM | POA: Diagnosis not present

## 2012-07-29 DIAGNOSIS — E1149 Type 2 diabetes mellitus with other diabetic neurological complication: Secondary | ICD-10-CM | POA: Diagnosis not present

## 2012-07-29 NOTE — Progress Notes (Signed)
Prn visit/walk-in for b/p check 154/80.

## 2012-08-01 ENCOUNTER — Encounter (HOSPITAL_BASED_OUTPATIENT_CLINIC_OR_DEPARTMENT_OTHER): Payer: Medicare Other | Admitting: Oncology

## 2012-08-01 VITALS — BP 143/71 | HR 81

## 2012-08-01 DIAGNOSIS — I10 Essential (primary) hypertension: Secondary | ICD-10-CM

## 2012-08-01 NOTE — Progress Notes (Signed)
In for BP check.   4/11 BP left arm 154/80 HR84 4/14 BP left arm 143/76 HR 81

## 2012-08-03 ENCOUNTER — Encounter (HOSPITAL_BASED_OUTPATIENT_CLINIC_OR_DEPARTMENT_OTHER): Payer: Medicare Other | Admitting: Oncology

## 2012-08-03 VITALS — BP 152/90 | HR 61

## 2012-08-03 DIAGNOSIS — I10 Essential (primary) hypertension: Secondary | ICD-10-CM

## 2012-08-03 NOTE — Progress Notes (Signed)
Message left on patient's voicemail that BPs are better but to see PCP when scheduled and to continue to check BP 2 times weekly and keep a record.  To call back with question.

## 2012-08-03 NOTE — Progress Notes (Signed)
In for BP check.  4/11 BP left arm 154/80 HR84  4/14 BP left arm 143/76 HR 81 4/16 BP left arm 152/90 HR 61

## 2012-08-10 DIAGNOSIS — H2510 Age-related nuclear cataract, unspecified eye: Secondary | ICD-10-CM | POA: Diagnosis not present

## 2012-08-10 DIAGNOSIS — E109 Type 1 diabetes mellitus without complications: Secondary | ICD-10-CM | POA: Diagnosis not present

## 2012-08-16 DIAGNOSIS — E119 Type 2 diabetes mellitus without complications: Secondary | ICD-10-CM | POA: Diagnosis not present

## 2012-08-16 DIAGNOSIS — H35369 Drusen (degenerative) of macula, unspecified eye: Secondary | ICD-10-CM | POA: Diagnosis not present

## 2012-08-16 DIAGNOSIS — H2510 Age-related nuclear cataract, unspecified eye: Secondary | ICD-10-CM | POA: Diagnosis not present

## 2012-08-18 ENCOUNTER — Ambulatory Visit (INDEPENDENT_AMBULATORY_CARE_PROVIDER_SITE_OTHER): Payer: Medicare Other | Admitting: *Deleted

## 2012-08-18 DIAGNOSIS — I4891 Unspecified atrial fibrillation: Secondary | ICD-10-CM

## 2012-08-18 DIAGNOSIS — Z7901 Long term (current) use of anticoagulants: Secondary | ICD-10-CM | POA: Diagnosis not present

## 2012-08-18 LAB — POCT INR: INR: 2.7

## 2012-09-02 DIAGNOSIS — J309 Allergic rhinitis, unspecified: Secondary | ICD-10-CM | POA: Diagnosis not present

## 2012-09-02 DIAGNOSIS — E119 Type 2 diabetes mellitus without complications: Secondary | ICD-10-CM | POA: Diagnosis not present

## 2012-09-02 DIAGNOSIS — J329 Chronic sinusitis, unspecified: Secondary | ICD-10-CM | POA: Diagnosis not present

## 2012-09-13 ENCOUNTER — Other Ambulatory Visit: Payer: Self-pay | Admitting: Cardiology

## 2012-09-13 ENCOUNTER — Other Ambulatory Visit: Payer: Self-pay | Admitting: *Deleted

## 2012-09-15 ENCOUNTER — Ambulatory Visit (INDEPENDENT_AMBULATORY_CARE_PROVIDER_SITE_OTHER): Payer: Medicare Other | Admitting: *Deleted

## 2012-09-15 DIAGNOSIS — Z7901 Long term (current) use of anticoagulants: Secondary | ICD-10-CM | POA: Diagnosis not present

## 2012-09-15 DIAGNOSIS — I4891 Unspecified atrial fibrillation: Secondary | ICD-10-CM

## 2012-09-15 LAB — POCT INR: INR: 2.3

## 2012-09-20 ENCOUNTER — Ambulatory Visit (INDEPENDENT_AMBULATORY_CARE_PROVIDER_SITE_OTHER): Payer: Medicare Other | Admitting: Adult Health

## 2012-09-20 ENCOUNTER — Encounter: Payer: Self-pay | Admitting: Cardiology

## 2012-09-20 VITALS — BP 159/91 | HR 75 | Ht 71.0 in | Wt 195.0 lb

## 2012-09-20 DIAGNOSIS — C8590 Non-Hodgkin lymphoma, unspecified, unspecified site: Secondary | ICD-10-CM

## 2012-09-20 DIAGNOSIS — I10 Essential (primary) hypertension: Secondary | ICD-10-CM

## 2012-09-20 DIAGNOSIS — I4891 Unspecified atrial fibrillation: Secondary | ICD-10-CM

## 2012-09-20 DIAGNOSIS — C8589 Other specified types of non-Hodgkin lymphoma, extranodal and solid organ sites: Secondary | ICD-10-CM | POA: Diagnosis not present

## 2012-09-20 MED ORDER — DILTIAZEM HCL ER COATED BEADS 180 MG PO CP24: 180.0000 mg | ORAL_CAPSULE | Freq: Every day | ORAL | Status: DC

## 2012-09-20 MED ORDER — POTASSIUM CHLORIDE CRYS ER 10 MEQ PO TBCR: 10.0000 meq | EXTENDED_RELEASE_TABLET | Freq: Two times a day (BID) | ORAL | Status: DC

## 2012-09-20 MED ORDER — LISINOPRIL-HYDROCHLOROTHIAZIDE 20-12.5 MG PO TABS: 1.0000 | ORAL_TABLET | Freq: Every day | ORAL | Status: DC

## 2012-09-20 NOTE — Progress Notes (Signed)
HPI: Mr. Riling is a 79 y/o patient of Dr.Tom Wall we are following for ongoing assessment and management of atrial fibrillation. He continues on coumadin therapy.Was last seen in the office in Nov 2013. He is doing well and without complaint. He usually goes to [place] during the winter. He has recently been started on linagliptin by Dr.[name] due to blood glucose elevations that have been persistent. He is followed in our coumadin clinic.  No Known Allergies  Current Outpatient Prescriptions  Medication Sig Dispense Refill  . acetaminophen (TYLENOL) 500 MG tablet Take 500 mg by mouth every 6 (six) hours as needed.      Marland Kitchen amitriptyline (ELAVIL) 50 MG tablet Take 50 mg by mouth at bedtime.       Marland Kitchen azelastine (ASTELIN) 137 MCG/SPRAY nasal spray Place 2 sprays into the nose 2 (two) times daily. Use in each nostril as directed      . Cranberry 200 MG CAPS Take 2 capsules by mouth daily.       Marland Kitchen diltiazem (CARDIZEM CD) 180 MG 24 hr capsule Take 1 capsule (180 mg total) by mouth daily.  90 capsule  3  . finasteride (PROSCAR) 5 MG tablet Take 1 tablet (5 mg total) by mouth daily.  90 tablet  3  . GuaiFENesin (MUCUS RELIEF ADULT PO) Take by mouth as needed.        . linagliptin (TRADJENTA) 5 MG TABS tablet Take 5 mg by mouth daily.      Marland Kitchen lisinopril-hydrochlorothiazide (PRINZIDE,ZESTORETIC) 20-12.5 MG per tablet Take 1 tablet by mouth daily.  90 tablet  3  . MULTIPLE VITAMIN PO Take 1 tablet by mouth daily.      Marland Kitchen omeprazole (PRILOSEC) 20 MG capsule Take 20 mg by mouth every other day.       . potassium chloride (KLOR-CON M10) 10 MEQ tablet       . warfarin (COUMADIN) 2.5 MG tablet TAKE BY MOUTH AS DIRECTED BY ANTICOAGULATION CLINIC  60 tablet  0   No current facility-administered medications for this visit.    Past Medical History  Diagnosis Date  . Hypertension   . Atrial fibrillation   . NHL (non-Hodgkin's lymphoma) 08/18/2011    Diffuse large B-cell lymphoma, clinically stage IIIA, CD20  positive, status post cervical lymph node biopsy 08/03/2003 on the left. PET scan was also positive in the spleen and small bowel region, but bone marrow aspiration and biopsy were negative. So he essentially had stage IIIAs. He received R-CHOP x6 cycles with CR established by PET scan criteria on 11/23/2003 with no evidence for relapse th  . 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 Jamey Ripa, thus far without recurrence.     Past Surgical History  Procedure Laterality Date  . Cholecystectomy    . Incision and drainage perirectal abscess      AVW:UJWJXB of systems complete and found to be negative unless listed above  PHYSICAL EXAM BP 159/91  Pulse 75  Ht 5\' 11"  (1.803 m)  Wt 195 lb (88.451 kg)  BMI 27.21 kg/m2  General: Well developed, well nourished, in no acute distress Head: Eyes PERRLA, No xanthomas.   Normal cephalic and atramatic  Lungs: Clear bilaterally to auscultation and percussion. Heart: HRIR S1 S2, without MRG.  Pulses are 2+ &  equal.            No carotid bruit. No JVD.  No abdominal bruits. No femoral bruits. Abdomen: Bowel sounds are positive, abdomen soft and non-tender without masses or                  Hernia's noted. Msk:  Back normal, normal gait. Normal strength and tone for age. Extremities: No clubbing, cyanosis or edema.  DP +1 Neuro: Alert and oriented X 3.HOH Psych:  Good affect, responds appropriately  GMW:NUUVOZ fibrillation with ICRBBB rate of 90 bpm  ASSESSMENT AND PLAN

## 2012-09-20 NOTE — Patient Instructions (Addendum)
Your physician recommends that you schedule a follow-up appointment in: 6 MONTHS 

## 2012-09-20 NOTE — Progress Notes (Signed)
Name: [name]    DOB: [birth year]  Age: 79 y.o.  MR#: [id number]       PCP:  Avon Gully, MD      Insurance: Payor: MEDICARE / Plan: MEDICARE PART A AND B / Product Type: *No Product type* /   CC:    Chief Complaint  Patient presents with  . Atrial Fibrillation    VS Filed Vitals:   09/20/12 1115  BP: 159/91  Pulse: 75  Height: 5\' 11"  (1.803 m)  Weight: 195 lb (88.451 kg)    Weights Current Weight  09/20/12 195 lb (88.451 kg)  07/27/12 193 lb 12.8 oz (87.907 kg)  03/09/12 191 lb (86.637 kg)    Blood Pressure  BP Readings from Last 3 Encounters:  09/20/12 159/91  08/03/12 152/90  08/01/12 143/71     Admit date:  (Not on file) Last encounter with RMR:  Visit date not found   Allergy Review of patient's allergies indicates no known allergies.  Current Outpatient Prescriptions  Medication Sig Dispense Refill  . acetaminophen (TYLENOL) 500 MG tablet Take 500 mg by mouth every 6 (six) hours as needed.      Marland Kitchen amitriptyline (ELAVIL) 50 MG tablet Take 50 mg by mouth at bedtime.       Marland Kitchen azelastine (ASTELIN) 137 MCG/SPRAY nasal spray Place 2 sprays into the nose 2 (two) times daily. Use in each nostril as directed      . Cranberry 200 MG CAPS Take 2 capsules by mouth daily.       Marland Kitchen diltiazem (CARDIZEM CD) 180 MG 24 hr capsule Take 1 capsule (180 mg total) by mouth daily.  90 capsule  3  . finasteride (PROSCAR) 5 MG tablet Take 1 tablet (5 mg total) by mouth daily.  90 tablet  3  . GuaiFENesin (MUCUS RELIEF ADULT PO) Take by mouth as needed.        . linagliptin (TRADJENTA) 5 MG TABS tablet Take 5 mg by mouth daily.      Marland Kitchen lisinopril-hydrochlorothiazide (PRINZIDE,ZESTORETIC) 20-12.5 MG per tablet Take 1 tablet by mouth daily.  90 tablet  3  . omeprazole (PRILOSEC) 20 MG capsule Take 20 mg by mouth every other day.       . potassium chloride (KLOR-CON M10) 10 MEQ tablet Take 10 mEq by mouth 2 (two) times daily.       Marland Kitchen warfarin (COUMADIN) 2.5 MG tablet TAKE BY MOUTH AS  DIRECTED BY ANTICOAGULATION CLINIC  60 tablet  0   No current facility-administered medications for this visit.    Discontinued Meds:    Medications Discontinued During This Encounter  Medication Reason  . MULTIPLE VITAMIN PO Completed Course    Patient Active Problem List   Diagnosis Date Noted  . NHL (non-Hodgkin's lymphoma) 08/18/2011  . 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 Encounter for long-term (current) use of anticoagulants 07/18/2010  . HYPERTENSION, UNSPECIFIED 10/04/2008  . ATRIAL FIBRILLATION 10/04/2008    LABS    Component Value Date/Time   NA 138 07/25/2012 0944   NA 137 01/07/2012 0856   NA 137 08/17/2011 0845   K 3.9 07/25/2012 0944   K 4.1 01/07/2012 0856   K 4.2 08/17/2011 0845   CL 100 07/25/2012 0944   CL 101 01/07/2012 0856   CL 98 08/17/2011 0845   CO2 28 07/25/2012 0944   CO2 30 01/07/2012 0856   CO2 31 08/17/2011 0845   GLUCOSE 151* 07/25/2012 0944   GLUCOSE 139* 01/07/2012 0856  GLUCOSE 146* 08/17/2011 0845   BUN 14 07/25/2012 0944   BUN 17 01/07/2012 0856   BUN 13 08/17/2011 0845   CREATININE 0.75 07/25/2012 0944   CREATININE 0.80 01/07/2012 0856   CREATININE 0.74 08/17/2011 0845   CALCIUM 9.9 07/25/2012 0944   CALCIUM 9.9 01/07/2012 0856   CALCIUM 10.0 08/17/2011 0845   GFRNONAA 81* 07/25/2012 0944   GFRNONAA 79* 01/07/2012 0856   GFRNONAA 82* 08/17/2011 0845   GFRAA >90 07/25/2012 0944   GFRAA >90 01/07/2012 0856   GFRAA >90 08/17/2011 0845   CMP     Component Value Date/Time   NA 138 07/25/2012 0944   K 3.9 07/25/2012 0944   CL 100 07/25/2012 0944   CO2 28 07/25/2012 0944   GLUCOSE 151* 07/25/2012 0944   BUN 14 07/25/2012 0944   CREATININE 0.75 07/25/2012 0944   CALCIUM 9.9 07/25/2012 0944   PROT 7.7 07/25/2012 0944   ALBUMIN 3.8 07/25/2012 0944   AST 15 07/25/2012 0944   ALT 13 07/25/2012 0944   ALKPHOS 117 07/25/2012 0944   BILITOT 0.5 07/25/2012 0944   GFRNONAA 81* 07/25/2012 0944   GFRAA >90 07/25/2012 0944       Component Value Date/Time   WBC 7.0  07/25/2012 0944   WBC 6.5 01/07/2012 0856   WBC 6.7 08/17/2011 0845   HGB 16.8 07/25/2012 0944   HGB 16.6 01/07/2012 0856   HGB 16.2 08/17/2011 0845   HCT 46.2 07/25/2012 0944   HCT 46.4 01/07/2012 0856   HCT 45.1 08/17/2011 0845   MCV 87.7 07/25/2012 0944   MCV 87.7 01/07/2012 0856   MCV 88.3 08/17/2011 0845    Lipid Panel     Component Value Date/Time   CHOL 145 01/20/2006   TRIG 219 01/20/2006   HDL 35 01/20/2006   LDLCALC 66 01/20/2006    ABG No results found for this basename: phart, pco2, pco2art, po2, po2art, hco3, tco2, acidbasedef, o2sat     No results found for this basename: TSH   BNP (last 3 results) No results found for this basename: PROBNP,  in the last 8760 hours Cardiac Panel (last 3 results) No results found for this basename: CKTOTAL, CKMB, TROPONINI, RELINDX,  in the last 72 hours  Iron/TIBC/Ferritin No results found for this basename: iron, tibc, ferritin     EKG Orders placed in visit on 03/09/12  . EKG 12-LEAD     Prior Assessment and Plan Problem List as of 09/20/2012   HYPERTENSION, UNSPECIFIED   Last Assessment & Plan   03/03/2011 Office Visit Written 03/03/2011  9:56 AM by Gaylord Shih, MD     Under pretty good control. We'll not push harder because of risk of dizziness and side effects considering his age. Continue to follow with primary care.    ATRIAL FIBRILLATION   Last Assessment & Plan   03/09/2012 Office Visit Written 03/09/2012 12:13 PM by Gaylord Shih, MD     Stable. Continue rate control and anticoagulation. Return the office in 6 months or when necessary. He always likes to come with his wife.    Encounter for long-term (current) use of anticoagulants   NHL (non-Hodgkin's lymphoma)   GIST (gastrointestinal stromal tumor), malignant       Imaging: No results found.

## 2012-09-20 NOTE — Assessment & Plan Note (Signed)
Rate is well controlled on current diltiazem doses. He is medically compliant concerning coumadin checks. No change in medications, He is given refills on cardiac medication.Follow up in 6 months.

## 2012-09-20 NOTE — Assessment & Plan Note (Signed)
Blood pressure records from home clearly are better. He has white coat BP elevation on prior appts. Recent labs per Dr. Felecia Shelling are reviewed and found to be WNL. No changes in medication regimen,

## 2012-10-04 DIAGNOSIS — E119 Type 2 diabetes mellitus without complications: Secondary | ICD-10-CM | POA: Diagnosis not present

## 2012-10-04 DIAGNOSIS — E1149 Type 2 diabetes mellitus with other diabetic neurological complication: Secondary | ICD-10-CM | POA: Diagnosis not present

## 2012-10-13 ENCOUNTER — Ambulatory Visit (INDEPENDENT_AMBULATORY_CARE_PROVIDER_SITE_OTHER): Payer: Medicare Other | Admitting: *Deleted

## 2012-10-13 DIAGNOSIS — Z7901 Long term (current) use of anticoagulants: Secondary | ICD-10-CM | POA: Diagnosis not present

## 2012-10-13 DIAGNOSIS — I4891 Unspecified atrial fibrillation: Secondary | ICD-10-CM | POA: Diagnosis not present

## 2012-10-13 LAB — POCT INR: INR: 2.7

## 2012-10-27 DIAGNOSIS — E119 Type 2 diabetes mellitus without complications: Secondary | ICD-10-CM | POA: Diagnosis not present

## 2012-10-27 DIAGNOSIS — C8588 Other specified types of non-Hodgkin lymphoma, lymph nodes of multiple sites: Secondary | ICD-10-CM | POA: Diagnosis not present

## 2012-10-27 DIAGNOSIS — I10 Essential (primary) hypertension: Secondary | ICD-10-CM | POA: Diagnosis not present

## 2012-10-31 ENCOUNTER — Other Ambulatory Visit: Payer: Self-pay | Admitting: Cardiology

## 2012-11-16 ENCOUNTER — Ambulatory Visit (INDEPENDENT_AMBULATORY_CARE_PROVIDER_SITE_OTHER): Payer: Medicare Other | Admitting: *Deleted

## 2012-11-16 DIAGNOSIS — Z7901 Long term (current) use of anticoagulants: Secondary | ICD-10-CM

## 2012-11-16 DIAGNOSIS — I4891 Unspecified atrial fibrillation: Secondary | ICD-10-CM | POA: Diagnosis not present

## 2012-11-16 LAB — POCT INR: INR: 2.4

## 2012-12-09 DIAGNOSIS — E1149 Type 2 diabetes mellitus with other diabetic neurological complication: Secondary | ICD-10-CM | POA: Diagnosis not present

## 2012-12-09 DIAGNOSIS — E119 Type 2 diabetes mellitus without complications: Secondary | ICD-10-CM | POA: Diagnosis not present

## 2012-12-13 DIAGNOSIS — E1149 Type 2 diabetes mellitus with other diabetic neurological complication: Secondary | ICD-10-CM | POA: Diagnosis not present

## 2012-12-13 DIAGNOSIS — E119 Type 2 diabetes mellitus without complications: Secondary | ICD-10-CM | POA: Diagnosis not present

## 2012-12-14 DIAGNOSIS — N390 Urinary tract infection, site not specified: Secondary | ICD-10-CM | POA: Diagnosis not present

## 2012-12-15 DIAGNOSIS — N390 Urinary tract infection, site not specified: Secondary | ICD-10-CM | POA: Diagnosis not present

## 2012-12-27 ENCOUNTER — Other Ambulatory Visit: Payer: Self-pay | Admitting: Cardiology

## 2012-12-28 ENCOUNTER — Ambulatory Visit (INDEPENDENT_AMBULATORY_CARE_PROVIDER_SITE_OTHER): Payer: Medicare Other | Admitting: *Deleted

## 2012-12-28 DIAGNOSIS — Z7901 Long term (current) use of anticoagulants: Secondary | ICD-10-CM

## 2012-12-28 DIAGNOSIS — I4891 Unspecified atrial fibrillation: Secondary | ICD-10-CM

## 2012-12-28 LAB — POCT INR: INR: 2.7

## 2012-12-29 DIAGNOSIS — Z85828 Personal history of other malignant neoplasm of skin: Secondary | ICD-10-CM | POA: Diagnosis not present

## 2012-12-29 DIAGNOSIS — D235 Other benign neoplasm of skin of trunk: Secondary | ICD-10-CM | POA: Diagnosis not present

## 2012-12-29 DIAGNOSIS — L570 Actinic keratosis: Secondary | ICD-10-CM | POA: Diagnosis not present

## 2012-12-29 DIAGNOSIS — C44319 Basal cell carcinoma of skin of other parts of face: Secondary | ICD-10-CM | POA: Diagnosis not present

## 2013-01-23 DIAGNOSIS — I10 Essential (primary) hypertension: Secondary | ICD-10-CM | POA: Diagnosis not present

## 2013-01-23 DIAGNOSIS — N400 Enlarged prostate without lower urinary tract symptoms: Secondary | ICD-10-CM | POA: Diagnosis not present

## 2013-01-23 DIAGNOSIS — IMO0001 Reserved for inherently not codable concepts without codable children: Secondary | ICD-10-CM | POA: Diagnosis not present

## 2013-01-23 DIAGNOSIS — E119 Type 2 diabetes mellitus without complications: Secondary | ICD-10-CM | POA: Diagnosis not present

## 2013-01-23 DIAGNOSIS — C8589 Other specified types of non-Hodgkin lymphoma, extranodal and solid organ sites: Secondary | ICD-10-CM | POA: Diagnosis not present

## 2013-01-23 DIAGNOSIS — N390 Urinary tract infection, site not specified: Secondary | ICD-10-CM | POA: Diagnosis not present

## 2013-01-24 ENCOUNTER — Encounter (HOSPITAL_COMMUNITY): Payer: Medicare Other | Attending: Hematology and Oncology

## 2013-01-24 DIAGNOSIS — I4891 Unspecified atrial fibrillation: Secondary | ICD-10-CM

## 2013-01-24 DIAGNOSIS — C8590 Non-Hodgkin lymphoma, unspecified, unspecified site: Secondary | ICD-10-CM

## 2013-01-24 DIAGNOSIS — C8589 Other specified types of non-Hodgkin lymphoma, extranodal and solid organ sites: Secondary | ICD-10-CM | POA: Insufficient documentation

## 2013-01-24 LAB — CBC WITH DIFFERENTIAL/PLATELET
Basophils Absolute: 0 10*3/uL (ref 0.0–0.1)
Basophils Relative: 1 % (ref 0–1)
Eosinophils Absolute: 0.2 10*3/uL (ref 0.0–0.7)
Eosinophils Relative: 2 % (ref 0–5)
HCT: 47.3 % (ref 39.0–52.0)
Hemoglobin: 17.1 g/dL — ABNORMAL HIGH (ref 13.0–17.0)
Lymphocytes Relative: 41 % (ref 12–46)
Lymphs Abs: 3.1 10*3/uL (ref 0.7–4.0)
MCH: 32 pg (ref 26.0–34.0)
MCHC: 36.2 g/dL — ABNORMAL HIGH (ref 30.0–36.0)
MCV: 88.6 fL (ref 78.0–100.0)
Monocytes Absolute: 0.7 10*3/uL (ref 0.1–1.0)
Monocytes Relative: 10 % (ref 3–12)
Neutro Abs: 3.5 10*3/uL (ref 1.7–7.7)
Neutrophils Relative %: 47 % (ref 43–77)
Platelets: 193 10*3/uL (ref 150–400)
RBC: 5.34 MIL/uL (ref 4.22–5.81)
RDW: 13.1 % (ref 11.5–15.5)
WBC: 7.5 10*3/uL (ref 4.0–10.5)

## 2013-01-24 LAB — COMPREHENSIVE METABOLIC PANEL
ALT: 14 U/L (ref 0–53)
AST: 16 U/L (ref 0–37)
Albumin: 3.9 g/dL (ref 3.5–5.2)
Alkaline Phosphatase: 98 U/L (ref 39–117)
BUN: 14 mg/dL (ref 6–23)
CO2: 29 mEq/L (ref 19–32)
Calcium: 10.1 mg/dL (ref 8.4–10.5)
Chloride: 100 mEq/L (ref 96–112)
Creatinine, Ser: 0.85 mg/dL (ref 0.50–1.35)
GFR calc Af Amer: 89 mL/min — ABNORMAL LOW (ref >90)
GFR calc non Af Amer: 77 mL/min — ABNORMAL LOW (ref >90)
Glucose, Bld: 169 mg/dL — ABNORMAL HIGH (ref 70–99)
Potassium: 4.2 mEq/L (ref 3.5–5.1)
Sodium: 139 mEq/L (ref 135–145)
Total Bilirubin: 1 mg/dL (ref 0.3–1.2)
Total Protein: 7.5 g/dL (ref 6.0–8.3)

## 2013-01-24 LAB — RETICULOCYTES
RBC.: 5.31 MIL/uL (ref 4.22–5.81)
Retic Count, Absolute: 95.6 10*3/uL (ref 19.0–186.0)
Retic Ct Pct: 1.8 % (ref 0.4–3.1)

## 2013-01-24 LAB — SEDIMENTATION RATE: Sed Rate: 1 mm/hr (ref 0–16)

## 2013-01-24 LAB — LACTATE DEHYDROGENASE: LDH: 147 U/L (ref 94–250)

## 2013-01-24 NOTE — Progress Notes (Signed)
Labs drawn today for beta 2 mic,retic,cbc/diff,cmp,ldh,sed rate

## 2013-01-25 ENCOUNTER — Encounter (HOSPITAL_BASED_OUTPATIENT_CLINIC_OR_DEPARTMENT_OTHER): Payer: Medicare Other

## 2013-01-25 VITALS — BP 149/86 | HR 83 | Temp 98.0°F | Resp 20 | Wt 192.4 lb

## 2013-01-25 DIAGNOSIS — C8589 Other specified types of non-Hodgkin lymphoma, extranodal and solid organ sites: Secondary | ICD-10-CM | POA: Diagnosis not present

## 2013-01-25 DIAGNOSIS — I4891 Unspecified atrial fibrillation: Secondary | ICD-10-CM

## 2013-01-25 DIAGNOSIS — C49A0 Gastrointestinal stromal tumor, unspecified site: Secondary | ICD-10-CM

## 2013-01-25 DIAGNOSIS — E119 Type 2 diabetes mellitus without complications: Secondary | ICD-10-CM | POA: Diagnosis not present

## 2013-01-25 DIAGNOSIS — C494 Malignant neoplasm of connective and soft tissue of abdomen: Secondary | ICD-10-CM | POA: Diagnosis not present

## 2013-01-25 DIAGNOSIS — C8590 Non-Hodgkin lymphoma, unspecified, unspecified site: Secondary | ICD-10-CM

## 2013-01-25 LAB — BETA 2 MICROGLOBULIN, SERUM: Beta-2 Microglobulin: 1.97 mg/L — ABNORMAL HIGH (ref 1.01–1.73)

## 2013-01-25 NOTE — Progress Notes (Signed)
[facility] OFFICE PROGRESS NOTE  [name],[name], MD [address] [place] [address]  DIAGNOSIS: NHL (non-Hodgkin's lymphoma) - Plan: Beta 2 microglobuline, serum, Reticulocytes, CBC with Differential, Reticulocytes, Comprehensive metabolic panel, Lactate dehydrogenase, Beta 2 microglobuline, serum  GIST (gastrointestinal stromal tumor), malignant  Chief Complaint  Patient presents with  . Lymphoma    followup    CURRENT THERAPY: Watchful expectation.  INTERVAL HISTORY: [name] 79 y.o. male returns for followup of diffuse large B cell lymphoma as well as GI ST. Appetite is good with no nausea, vomiting, PND, orthopnea, palpitations, diarrhea, constipation, lower extremity swelling or redness, chest pain, sore throat, skin rash, headache, or seizures.   MEDICAL HISTORY: Past Medical History  Diagnosis Date  . Hypertension   . Atrial fibrillation   . NHL (non-Hodgkin's lymphoma) 08/18/2011    Diffuse large B-cell lymphoma, clinically stage IIIA, CD20 positive, status post cervical lymph node biopsy 08/03/2003 on the left. PET scan was also positive in the spleen and small bowel region, but bone marrow aspiration and biopsy were negative. So he essentially had stage IIIAs. He received R-CHOP x6 cycles with CR established by PET scan criteria on 11/23/2003 with no evidence for relapse th  . 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 Jamey Ripa, thus far without recurrence.     INTERIM HISTORY: has HYPERTENSION, UNSPECIFIED; ATRIAL FIBRILLATION; Encounter for long-term (current) use of anticoagulants; NHL (non-Hodgkin's lymphoma); and GIST (gastrointestinal stromal tumor), malignant on his problem list.   #1 diffuse large  B-cell lymphoma clinically stage IIIAS STATUS post cervical lymph node biopsy on 08/03/2003. His lymphoma CD20 positive and PET scan showed positivity in the spleen. Bone marrow aspiration and biopsy were negative however. He received R. CHOP x6 cycles with a complete remission established by PET scan criteria on 11/23/2003. He completed 6 full cycles total. He remains without disease recurrence. He remains free of B. Symptomatology.  GIST of the small bowel, 4.5 cm in size, with intermediate prognostic grade, found on his PET scan in the small bowel status post resection by Dr. Jamey Ripa in October 2005   ALLERGIES:  has No Known Allergies.  MEDICATIONS: has a current medication list which includes the following prescription(s): acetaminophen, amitriptyline, azelastine, cranberry, diltiazem, finasteride, guaifenesin, linagliptin, lisinopril-hydrochlorothiazide, omeprazole, potassium chloride, and warfarin.  SURGICAL HISTORY:  Past Surgical History  Procedure Laterality Date  . Cholecystectomy    . Incision and drainage perirectal abscess      FAMILY HISTORY: family history is not on file.  SOCIAL HISTORY:  reports that he has never smoked. He has never used smokeless tobacco. He reports that he does not drink alcohol or use illicit drugs.  REVIEW OF SYSTEMS:  Other than that discussed above is noncontributory.  PHYSICAL EXAMINATION: ECOG PERFORMANCE STATUS: 0 - Asymptomatic  Blood pressure 149/86, pulse 83, temperature 98 F (36.7 C), temperature source Oral, resp. rate 20, weight 192 lb 6.4 oz (87.272 kg).  GENERAL:alert, no distress and comfortable SKIN: skin color, texture, turgor are normal, no rashes or significant lesions. Recent resection of basal cell carcinoma of the nose, healing well. EYES: PERLA; Conjunctiva are pink and non-injected, sclera clear OROPHARYNX:no exudate, no erythema on lips, buccal mucosa, or tongue. NECK: supple, thyroid normal size, non-tender, without  nodularity. No masses  CHEST: Increased AP diameter. No gynecomastia. LYMPH:  no palpable lymphadenopathy in the cervical, axillary or inguinal LUNGS: clear to auscultation and percussion with normal breathing effort HEART: Irregularly irregular rhythm, no murmurs and no lower extremity edema ABDOMEN:abdomen soft, non-tender and normal bowel sounds MUSCULOSKELETAL:no cyanosis of digits and no clubbing. Range of motion normal.  NEURO: alert & oriented x 3 with fluent speech, no focal motor/sensory deficits   LABORATORY DATA: Infusion on 01/24/2013  Component Date Value Range Status  . WBC 01/24/2013 7.5  4.0 - 10.5 K/uL Final  . RBC 01/24/2013 5.34  4.22 - 5.81 MIL/uL Final  . Hemoglobin 01/24/2013 17.1* 13.0 - 17.0 g/dL Final  . HCT 16/01/9603 47.3  39.0 - 52.0 % Final  . MCV 01/24/2013 88.6  78.0 - 100.0 fL Final  . MCH 01/24/2013 32.0  26.0 - 34.0 pg Final  . MCHC 01/24/2013 36.2* 30.0 - 36.0 g/dL Final  . RDW 54/12/8117 13.1  11.5 - 15.5 % Final  . Platelets 01/24/2013 193  150 - 400 K/uL Final  . Neutrophils Relative % 01/24/2013 47  43 - 77 % Final  . Neutro Abs 01/24/2013 3.5  1.7 - 7.7 K/uL Final  . Lymphocytes Relative 01/24/2013 41  12 - 46 % Final  . Lymphs Abs 01/24/2013 3.1  0.7 - 4.0 K/uL Final  . Monocytes Relative 01/24/2013 10  3 - 12 % Final  . Monocytes Absolute 01/24/2013 0.7  0.1 - 1.0 K/uL Final  . Eosinophils Relative 01/24/2013 2  0 - 5 % Final  . Eosinophils Absolute 01/24/2013 0.2  0.0 - 0.7 K/uL Final  . Basophils Relative 01/24/2013 1  0 - 1 % Final  . Basophils Absolute 01/24/2013 0.0  0.0 - 0.1 K/uL Final  . Sodium 01/24/2013 139  135 - 145 mEq/L Final  . Potassium 01/24/2013 4.2  3.5 - 5.1 mEq/L Final  . Chloride 01/24/2013 100  96 - 112 mEq/L Final  . CO2 01/24/2013 29  19 - 32 mEq/L Final  . Glucose, Bld 01/24/2013 169* 70 - 99 mg/dL Final  . BUN 14/78/2956 14  6 - 23 mg/dL Final  . Creatinine, Ser 01/24/2013 0.85  0.50 - 1.35 mg/dL Final  .  Calcium 21/30/8657 10.1  8.4 - 10.5 mg/dL Final  . Total Protein 01/24/2013 7.5  6.0 - 8.3 g/dL Final  . Albumin 84/69/6295 3.9  3.5 - 5.2 g/dL Final  . AST 28/41/3244 16  0 - 37 U/L Final  . ALT 01/24/2013 14  0 - 53 U/L Final  . Alkaline Phosphatase 01/24/2013 98  39 - 117 U/L Final  . Total Bilirubin 01/24/2013 1.0  0.3 - 1.2 mg/dL Final  . GFR calc non Af Amer 01/24/2013 77* >90 mL/min Final  . GFR calc Af Amer 01/24/2013 89* >90 mL/min Final   Comment: (NOTE)                          The eGFR has been calculated using the CKD EPI equation.                          This calculation has not been validated in all clinical situations.                          eGFR's persistently <90 mL/min signify possible Chronic Kidney  Disease.  Marland Kitchen LDH 01/24/2013 147  94 - 250 U/L Final  . Sed Rate 01/24/2013 1  0 - 16 mm/hr Final  . Retic Ct Pct 01/24/2013 1.8  0.4 - 3.1 % Final  . RBC. 01/24/2013 5.31  4.22 - 5.81 MIL/uL Final  . Retic Count, Manual 01/24/2013 95.6  19.0 - 186.0 K/uL Final  Anti-coag visit on 12/28/2012  Component Date Value Range Status  . INR 12/28/2012 2.7   Final    PATHOLOGY:  Urinalysis No results found for this basename: colorurine, appearanceur, labspec, phurine, glucoseu, hgbur, bilirubinur, ketonesur, proteinur, urobilinogen, nitrite, leukocytesur    RADIOGRAPHIC STUDIES: No results found.  ASSESSMENT: #1.GIST of the small bowel, 4.5 cm in size, with intermediate prognostic grade, found on his PET scan in the small bowel status post resection by Dr. Jamey Ripa in October 2005 #2. diffuse large B-cell lymphoma clinically stage IIIAS STATUS post cervical lymph node biopsy on 08/03/2003. His lymphoma CD20 positive and PET scan showed positivity in the spleen. Bone marrow aspiration and biopsy were negative however. He received R. CHOP x6 cycles with a complete remission established by PET scan criteria on 11/23/2003. He completed 6 full cycles total.  He remains without disease recurrence. He remains free of B. Symptomatology. #3. Atrial fibrillation on warfarin, controlled ventricular response. #4. Hypertension, controlled. #6. Basal cell carcinoma, status post recent resection from nose, healing well. #7. Diabetes mellitus, non-insulin requiring, controlled.   PLAN: #1. Continue watchful expectation and call should he develop increasing lethargy, dark urine, NuLev nose, fever, or night sweats. Also change in bowel habits would be important to report. Intense ago to [place] until April of 2013. #2. Followup in 6 months.   All questions were answered. The patient knows to call the clinic with any problems, questions or concerns. We can certainly see the patient much sooner if necessary.   I spent 30 minutes counseling the patient face to face. The total time spent in the appointment was 25 minutes.    Maurilio Lovely, MD 01/25/2013 11:48 AM

## 2013-01-25 NOTE — Patient Instructions (Signed)
[facility] Discharge Instructions  RECOMMENDATIONS MADE BY THE CONSULTANT AND ANY TEST RESULTS WILL BE SENT TO YOUR REFERRING PHYSICIAN.  EXAM FINDINGS BY THE PHYSICIAN TODAY AND SIGNS OR SYMPTOMS TO REPORT TO CLINIC OR PRIMARY PHYSICIAN:  Return in April to see MD  Exam good  Thank you for choosing [name] to provide your oncology and hematology care.  To afford each patient quality time with our providers, please arrive at least 15 minutes before your scheduled appointment time.  With your help, our goal is to use those 15 minutes to complete the necessary work-up to ensure our physicians have the information they need to help with your evaluation and healthcare recommendations.    Effective January 1st, 2014, we ask that you re-schedule your appointment with our physicians should you arrive 10 or more minutes late for your appointment.  We strive to give you quality time with our providers, and arriving late affects you and other patients whose appointments are after yours.    Again, thank you for choosing [facility].  Our hope is that these requests will decrease the amount of time that you wait before being seen by our physicians.       _____________________________________________________________  Should you have questions after your visit to [facility], please contact our office at [phone] between the hours of 8:30 a.m. and 5:00 p.m.  Voicemails left after 4:30 p.m. will not be returned until the following business day.  For prescription refill requests, have your pharmacy contact our office with your prescription refill request.

## 2013-01-30 DIAGNOSIS — Z85828 Personal history of other malignant neoplasm of skin: Secondary | ICD-10-CM | POA: Diagnosis not present

## 2013-02-08 ENCOUNTER — Ambulatory Visit (INDEPENDENT_AMBULATORY_CARE_PROVIDER_SITE_OTHER): Payer: Medicare Other | Admitting: *Deleted

## 2013-02-08 DIAGNOSIS — Z7901 Long term (current) use of anticoagulants: Secondary | ICD-10-CM

## 2013-02-08 DIAGNOSIS — I4891 Unspecified atrial fibrillation: Secondary | ICD-10-CM | POA: Diagnosis not present

## 2013-02-08 LAB — POCT INR: INR: 2.2

## 2013-02-10 ENCOUNTER — Telehealth: Payer: Self-pay | Admitting: *Deleted

## 2013-02-10 NOTE — Telephone Encounter (Signed)
Request received from pharmacy for Coumadin refill.This nurse gave refill for Coumadin 2.5 mg thirty day supply with RF x 3

## 2013-02-21 DIAGNOSIS — E1149 Type 2 diabetes mellitus with other diabetic neurological complication: Secondary | ICD-10-CM | POA: Diagnosis not present

## 2013-02-21 DIAGNOSIS — B351 Tinea unguium: Secondary | ICD-10-CM | POA: Diagnosis not present

## 2013-03-06 ENCOUNTER — Ambulatory Visit: Payer: Medicare Other | Admitting: Adult Health

## 2013-03-15 ENCOUNTER — Ambulatory Visit (INDEPENDENT_AMBULATORY_CARE_PROVIDER_SITE_OTHER): Payer: Medicare Other | Admitting: Cardiology

## 2013-03-15 ENCOUNTER — Encounter: Payer: Self-pay | Admitting: Cardiology

## 2013-03-15 VITALS — BP 152/85 | HR 88 | Ht 71.0 in | Wt 191.0 lb

## 2013-03-15 DIAGNOSIS — I4891 Unspecified atrial fibrillation: Secondary | ICD-10-CM | POA: Diagnosis not present

## 2013-03-15 DIAGNOSIS — I10 Essential (primary) hypertension: Secondary | ICD-10-CM

## 2013-03-15 NOTE — Patient Instructions (Addendum)
Your physician wants you to follow-up in: ONE YEAR You will receive a reminder letter in the mail two months in advance. If you don't receive a letter, please call our office to schedule the follow-up appointment.  

## 2013-03-15 NOTE — Progress Notes (Signed)
Clinical Summary [name] is a 79 y.o.male former patient of Dr Daleen Squibb, last seen by NP Lyman Bishop. This is our first visit together. He was seen for the following medical problems.   1. Atrial fibrillation - rate controlled with diltiazem, denies any palpitations. No SOB or dizziness.  - on coumadin followed here in our clinic, denies any troubles with bleeding   2. HTN - checks occasionally, typically 120-150/80s - compliant with meds - reports a recent decrease in his bp meds by his PCP, he was not sure of the reason.     Past Medical History  Diagnosis Date  . Hypertension   . Atrial fibrillation   . NHL (non-Hodgkin's lymphoma) 08/18/2011    Diffuse large B-cell lymphoma, clinically stage IIIA, CD20 positive, status post cervical lymph node biopsy 08/03/2003 on the left. PET scan was also positive in the spleen and small bowel region, but bone marrow aspiration and biopsy were negative. So he essentially had stage IIIAs. He received R-CHOP x6 cycles with CR established by PET scan criteria on 11/23/2003 with no evidence for relapse th  . 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 Jamey Ripa, thus far without recurrence.      No Known Allergies   Current Outpatient Prescriptions  Medication Sig Dispense Refill  . acetaminophen (TYLENOL) 500 MG tablet Take 500 mg by mouth every 6 (six) hours as needed.      Marland Kitchen amitriptyline (ELAVIL) 50 MG tablet Take 25 mg by mouth at bedtime.       Marland Kitchen azelastine (ASTELIN) 137 MCG/SPRAY nasal spray Place 2 sprays into the nose 2 (two) times daily. Use in each nostril as directed      . Cranberry 200 MG CAPS Take 2 capsules by mouth daily.       Marland Kitchen diltiazem (CARDIZEM CD) 180 MG 24 hr capsule Take 1 capsule (180 mg  total) by mouth daily.  90 capsule  1  . finasteride (PROSCAR) 5 MG tablet Take 1 tablet (5 mg total) by mouth daily.  90 tablet  3  . GuaiFENesin (MUCUS RELIEF ADULT PO) Take by mouth as needed.        . linagliptin (TRADJENTA) 5 MG TABS tablet Take 5 mg by mouth daily.      Marland Kitchen lisinopril-hydrochlorothiazide (PRINZIDE,ZESTORETIC) 20-12.5 MG per tablet Take 1 tablet by mouth daily.  90 tablet  1  . omeprazole (PRILOSEC) 20 MG capsule Take 20 mg by mouth every other day.       . potassium chloride (K-DUR,KLOR-CON) 10 MEQ tablet Take 10 mEq by mouth daily.      Marland Kitchen warfarin (COUMADIN) 2.5 MG tablet TAKE BY MOUTH AS DIRECTED BY ANTICOAGULATION CLINIC  60 tablet  0   No current facility-administered medications for this visit.     Past Surgical History  Procedure Laterality Date  . Cholecystectomy    . Incision and drainage perirectal abscess       No Known Allergies    No family history on file.   Social History [name] reports that he has never smoked. He has never used smokeless tobacco. [name] reports that he does not drink alcohol.   Review of Systems CONSTITUTIONAL: No weight loss, fever, chills, weakness or fatigue.  HEENT: Eyes: No visual  loss, blurred vision, double vision or yellow sclerae.No hearing loss, sneezing, congestion, runny nose or sore throat.  SKIN: No rash or itching.  CARDIOVASCULAR: per HPI RESPIRATORY: No shortness of breath, cough or sputum.  GASTROINTESTINAL: No anorexia, nausea, vomiting or diarrhea. No abdominal pain or blood.  GENITOURINARY: No burning on urination, no polyuria NEUROLOGICAL: No headache, dizziness, syncope, paralysis, ataxia, numbness or tingling in the extremities. No change in bowel or bladder control.  MUSCULOSKELETAL: No muscle, back pain, joint pain or stiffness.  LYMPHATICS: No enlarged nodes. No history of splenectomy.  PSYCHIATRIC: No history of depression or anxiety.  ENDOCRINOLOGIC: No reports of sweating, cold or  heat intolerance. No polyuria or polydipsia.  Marland Kitchen   Physical Examination p 88 bp 152/85 Wt 191 lbs BMI 27 Gen: resting comfortably, no acute distress HEENT: no scleral icterus, pupils equal round and reactive, no palptable cervical adenopathy,  CV: RRR, no m/r/g, no JVD, no carotid bruits Resp: Clear to auscultation bilaterally GI: abdomen is soft, non-tender, non-distended, normal bowel sounds, no hepatosplenomegaly MSK: extremities are warm, no edema.  Skin: warm, no rash Neuro:  no focal deficits Psych: appropriate affect    Assessment and Plan  1. Afib - no current symptoms, continue current medications for rate control - continue coumadin  2. HTN - above goal today in clinic, goal bp given history of DM is less than 130/80. Patient reports recent decrease in home bp meds by PCP, he is unsure of the reason. Will defer management to his PCP.    Follow up 1 year   Antoine Poche, M.D., F.A.C.C.

## 2013-03-22 ENCOUNTER — Ambulatory Visit (INDEPENDENT_AMBULATORY_CARE_PROVIDER_SITE_OTHER): Payer: Medicare Other | Admitting: *Deleted

## 2013-03-22 DIAGNOSIS — Z7901 Long term (current) use of anticoagulants: Secondary | ICD-10-CM

## 2013-03-22 DIAGNOSIS — I4891 Unspecified atrial fibrillation: Secondary | ICD-10-CM

## 2013-03-22 LAB — POCT INR: INR: 3.8

## 2013-04-10 DIAGNOSIS — I10 Essential (primary) hypertension: Secondary | ICD-10-CM | POA: Diagnosis not present

## 2013-04-10 DIAGNOSIS — E119 Type 2 diabetes mellitus without complications: Secondary | ICD-10-CM | POA: Diagnosis not present

## 2013-04-10 DIAGNOSIS — N39498 Other specified urinary incontinence: Secondary | ICD-10-CM | POA: Diagnosis not present

## 2013-04-17 ENCOUNTER — Ambulatory Visit (INDEPENDENT_AMBULATORY_CARE_PROVIDER_SITE_OTHER): Payer: Medicare Other | Admitting: *Deleted

## 2013-04-17 DIAGNOSIS — I4891 Unspecified atrial fibrillation: Secondary | ICD-10-CM | POA: Diagnosis not present

## 2013-04-17 DIAGNOSIS — Z7901 Long term (current) use of anticoagulants: Secondary | ICD-10-CM

## 2013-04-17 DIAGNOSIS — Z5181 Encounter for therapeutic drug level monitoring: Secondary | ICD-10-CM | POA: Diagnosis not present

## 2013-04-17 LAB — POCT INR: INR: 1.9

## 2013-04-18 DIAGNOSIS — E1149 Type 2 diabetes mellitus with other diabetic neurological complication: Secondary | ICD-10-CM | POA: Diagnosis not present

## 2013-04-18 DIAGNOSIS — B351 Tinea unguium: Secondary | ICD-10-CM | POA: Diagnosis not present

## 2013-04-20 HISTORY — PX: CATARACT EXTRACTION: SUR2

## 2013-05-02 ENCOUNTER — Other Ambulatory Visit: Payer: Self-pay | Admitting: Cardiology

## 2013-05-10 ENCOUNTER — Other Ambulatory Visit: Payer: Self-pay | Admitting: Cardiology

## 2013-05-15 ENCOUNTER — Ambulatory Visit (INDEPENDENT_AMBULATORY_CARE_PROVIDER_SITE_OTHER): Payer: Medicare Other | Admitting: *Deleted

## 2013-05-15 DIAGNOSIS — I4891 Unspecified atrial fibrillation: Secondary | ICD-10-CM | POA: Diagnosis not present

## 2013-05-15 DIAGNOSIS — Z7901 Long term (current) use of anticoagulants: Secondary | ICD-10-CM

## 2013-05-15 LAB — POCT INR: INR: 2.3

## 2013-05-24 ENCOUNTER — Other Ambulatory Visit: Payer: Self-pay | Admitting: Adult Health

## 2013-06-12 ENCOUNTER — Ambulatory Visit (INDEPENDENT_AMBULATORY_CARE_PROVIDER_SITE_OTHER): Payer: Medicare Other | Admitting: *Deleted

## 2013-06-12 DIAGNOSIS — Z5181 Encounter for therapeutic drug level monitoring: Secondary | ICD-10-CM | POA: Diagnosis not present

## 2013-06-12 DIAGNOSIS — I4891 Unspecified atrial fibrillation: Secondary | ICD-10-CM

## 2013-06-12 DIAGNOSIS — Z7901 Long term (current) use of anticoagulants: Secondary | ICD-10-CM

## 2013-06-12 LAB — POCT INR: INR: 2.4

## 2013-06-26 DIAGNOSIS — I509 Heart failure, unspecified: Secondary | ICD-10-CM | POA: Diagnosis not present

## 2013-06-26 DIAGNOSIS — Z0000 Encounter for general adult medical examination without abnormal findings: Secondary | ICD-10-CM | POA: Diagnosis not present

## 2013-06-26 DIAGNOSIS — N390 Urinary tract infection, site not specified: Secondary | ICD-10-CM | POA: Diagnosis not present

## 2013-06-26 DIAGNOSIS — E7800 Pure hypercholesterolemia, unspecified: Secondary | ICD-10-CM | POA: Diagnosis not present

## 2013-06-26 DIAGNOSIS — E119 Type 2 diabetes mellitus without complications: Secondary | ICD-10-CM | POA: Diagnosis not present

## 2013-06-26 DIAGNOSIS — I10 Essential (primary) hypertension: Secondary | ICD-10-CM | POA: Diagnosis not present

## 2013-06-27 DIAGNOSIS — B351 Tinea unguium: Secondary | ICD-10-CM | POA: Diagnosis not present

## 2013-06-29 ENCOUNTER — Ambulatory Visit (INDEPENDENT_AMBULATORY_CARE_PROVIDER_SITE_OTHER): Payer: Medicare Other | Admitting: *Deleted

## 2013-06-29 DIAGNOSIS — Z5181 Encounter for therapeutic drug level monitoring: Secondary | ICD-10-CM

## 2013-06-29 DIAGNOSIS — Z7901 Long term (current) use of anticoagulants: Secondary | ICD-10-CM | POA: Diagnosis not present

## 2013-06-29 DIAGNOSIS — I4891 Unspecified atrial fibrillation: Secondary | ICD-10-CM

## 2013-06-29 LAB — POCT INR: INR: 2.9

## 2013-07-06 ENCOUNTER — Ambulatory Visit (INDEPENDENT_AMBULATORY_CARE_PROVIDER_SITE_OTHER): Payer: Medicare Other | Admitting: *Deleted

## 2013-07-06 DIAGNOSIS — I4891 Unspecified atrial fibrillation: Secondary | ICD-10-CM | POA: Diagnosis not present

## 2013-07-06 DIAGNOSIS — Z5181 Encounter for therapeutic drug level monitoring: Secondary | ICD-10-CM | POA: Diagnosis not present

## 2013-07-06 DIAGNOSIS — Z7901 Long term (current) use of anticoagulants: Secondary | ICD-10-CM

## 2013-07-06 LAB — POCT INR: INR: 3.6

## 2013-07-14 DIAGNOSIS — N390 Urinary tract infection, site not specified: Secondary | ICD-10-CM | POA: Diagnosis not present

## 2013-07-14 DIAGNOSIS — E119 Type 2 diabetes mellitus without complications: Secondary | ICD-10-CM | POA: Diagnosis not present

## 2013-07-17 DIAGNOSIS — H43399 Other vitreous opacities, unspecified eye: Secondary | ICD-10-CM | POA: Diagnosis not present

## 2013-07-17 DIAGNOSIS — H2510 Age-related nuclear cataract, unspecified eye: Secondary | ICD-10-CM | POA: Diagnosis not present

## 2013-07-18 ENCOUNTER — Ambulatory Visit (INDEPENDENT_AMBULATORY_CARE_PROVIDER_SITE_OTHER): Payer: Medicare Other | Admitting: Urology

## 2013-07-18 ENCOUNTER — Other Ambulatory Visit: Payer: Self-pay | Admitting: Adult Health

## 2013-07-18 DIAGNOSIS — N210 Calculus in bladder: Secondary | ICD-10-CM | POA: Diagnosis not present

## 2013-07-18 DIAGNOSIS — N138 Other obstructive and reflux uropathy: Secondary | ICD-10-CM

## 2013-07-18 DIAGNOSIS — N400 Enlarged prostate without lower urinary tract symptoms: Secondary | ICD-10-CM | POA: Diagnosis not present

## 2013-07-18 DIAGNOSIS — N401 Enlarged prostate with lower urinary tract symptoms: Secondary | ICD-10-CM

## 2013-07-19 ENCOUNTER — Other Ambulatory Visit: Payer: Self-pay | Admitting: Urology

## 2013-07-19 ENCOUNTER — Telehealth: Payer: Self-pay | Admitting: *Deleted

## 2013-07-19 NOTE — Telephone Encounter (Signed)
PT HAS SURGERY SCHEDULED FOR 08/10/13, TURN OF PROSTATE AND BREAKING UP OF BLADDER STONE. PT IS COUMADIN AND THEY NEED TO KNOW WHEN TO STOP AND IF HE NEED BRIDGING OR LOVANOX/TMJ

## 2013-07-19 NOTE — Telephone Encounter (Signed)
Next coumadin check is on 07-27-13  Please advise

## 2013-07-20 NOTE — Telephone Encounter (Signed)
His CHADS2Vasc score is 3, I would recommend bridging with lovenox. Hold coumadin 5 days prior to procedure with lovenox bridge. Hold lovenox day of procedure, resume day after for 5 days while started back on coumadin. He needs a BMET prior to lovenox dosing. [name] can you see him and set this up.   Carlyle Dolly MD

## 2013-07-20 NOTE — Telephone Encounter (Signed)
Please advise 

## 2013-07-27 ENCOUNTER — Other Ambulatory Visit: Payer: Self-pay | Admitting: Cardiology

## 2013-07-27 ENCOUNTER — Ambulatory Visit (INDEPENDENT_AMBULATORY_CARE_PROVIDER_SITE_OTHER): Payer: Medicare Other | Admitting: *Deleted

## 2013-07-27 DIAGNOSIS — Z5181 Encounter for therapeutic drug level monitoring: Secondary | ICD-10-CM

## 2013-07-27 DIAGNOSIS — Z7901 Long term (current) use of anticoagulants: Secondary | ICD-10-CM | POA: Diagnosis not present

## 2013-07-27 DIAGNOSIS — I4891 Unspecified atrial fibrillation: Secondary | ICD-10-CM

## 2013-07-27 LAB — BASIC METABOLIC PANEL WITH GFR
BUN: 18 mg/dL (ref 6–23)
CO2: 30 mEq/L (ref 19–32)
Calcium: 10.2 mg/dL (ref 8.4–10.5)
Chloride: 100 mEq/L (ref 96–112)
Creat: 1.14 mg/dL (ref 0.50–1.35)
GFR, Est African American: 66 mL/min
GFR, Est Non African American: 58 mL/min — ABNORMAL LOW
Glucose, Bld: 143 mg/dL — ABNORMAL HIGH (ref 70–99)
Potassium: 5 mEq/L (ref 3.5–5.3)
Sodium: 140 mEq/L (ref 135–145)

## 2013-07-27 LAB — POCT INR: INR: 2.4

## 2013-07-27 NOTE — Patient Instructions (Signed)
4/17  Last dose of coumadin 4/18  No lovenox or coumadin 4/19  Lovenox  8am 4/20  Lovenox 8am 4/21  Lovenox 8am 4/22  Lovenox 8am 4/23  No lovenox.....Marland Kitchensurgery ......... Coumadin  Pm

## 2013-07-27 NOTE — Telephone Encounter (Signed)
Bridging arranged with patient.

## 2013-08-01 ENCOUNTER — Encounter (HOSPITAL_COMMUNITY): Payer: Self-pay | Admitting: Pharmacy Technician

## 2013-08-03 ENCOUNTER — Encounter (HOSPITAL_COMMUNITY): Payer: Self-pay

## 2013-08-03 ENCOUNTER — Ambulatory Visit (HOSPITAL_COMMUNITY)
Admission: RE | Admit: 2013-08-03 | Discharge: 2013-08-03 | Disposition: A | Discharge status: Home / Self Care | Payer: Medicare Other | Source: Ambulatory Visit | Attending: Anesthesiology | Admitting: Anesthesiology

## 2013-08-03 ENCOUNTER — Telehealth: Payer: Self-pay

## 2013-08-03 ENCOUNTER — Encounter (HOSPITAL_COMMUNITY)
Admission: RE | Admit: 2013-08-03 | Discharge: 2013-08-03 | Disposition: A | Discharge status: Home / Self Care | Payer: Medicare Other | Source: Ambulatory Visit | Attending: Urology | Admitting: Urology

## 2013-08-03 DIAGNOSIS — Z01812 Encounter for preprocedural laboratory examination: Secondary | ICD-10-CM | POA: Insufficient documentation

## 2013-08-03 DIAGNOSIS — Z01818 Encounter for other preprocedural examination: Secondary | ICD-10-CM | POA: Insufficient documentation

## 2013-08-03 HISTORY — DX: Calculus in bladder: N21.0

## 2013-08-03 HISTORY — DX: Unspecified cataract: H26.9

## 2013-08-03 LAB — BASIC METABOLIC PANEL
BUN: 14 mg/dL (ref 6–23)
CO2: 29 mEq/L (ref 19–32)
Calcium: 10.1 mg/dL (ref 8.4–10.5)
Chloride: 101 mEq/L (ref 96–112)
Creatinine, Ser: 1.04 mg/dL (ref 0.50–1.35)
GFR calc Af Amer: 72 mL/min — ABNORMAL LOW (ref >90)
GFR calc non Af Amer: 62 mL/min — ABNORMAL LOW (ref >90)
Glucose, Bld: 132 mg/dL — ABNORMAL HIGH (ref 70–99)
Potassium: 4.9 mEq/L (ref 3.7–5.3)
Sodium: 139 mEq/L (ref 137–147)

## 2013-08-03 LAB — CBC
HCT: 43.8 % (ref 39.0–52.0)
Hemoglobin: 15.5 g/dL (ref 13.0–17.0)
MCH: 30.8 pg (ref 26.0–34.0)
MCHC: 35.4 g/dL (ref 30.0–36.0)
MCV: 86.9 fL (ref 78.0–100.0)
Platelets: 202 10*3/uL (ref 150–400)
RBC: 5.04 MIL/uL (ref 4.22–5.81)
RDW: 13.6 % (ref 11.5–15.5)
WBC: 6.8 10*3/uL (ref 4.0–10.5)

## 2013-08-03 LAB — APTT: aPTT: 37 seconds (ref 24–37)

## 2013-08-03 LAB — PROTIME-INR
INR: 2.03 — ABNORMAL HIGH (ref 0.00–1.49)
Prothrombin Time: 22.3 seconds — ABNORMAL HIGH (ref 11.6–15.2)

## 2013-08-03 NOTE — Pre-Procedure Instructions (Signed)
08-03-13 EKG 6'14 Epic. CXR done today.

## 2013-08-03 NOTE — Telephone Encounter (Signed)
See phone note from [facility] 07/19/13 regarding advice for Lovenox bridge.Patient is set to begin bridging next with with Edrick Oh RN Coumadin Nurse   I copied note and faxed to [facility]   C.Wilber Oliphant RN

## 2013-08-03 NOTE — Patient Instructions (Addendum)
[name]  08/03/2013   Your procedure is scheduled on:  4-23 -2015  Report to [facility] at     1045   AM.  Call this number if you have problems the morning of surgery: [phone]  Or Presurgical Testing [phone](Nimo Verastegui) For Living Will and/or Health Care Power Attorney Forms: please provide copy for your medical record,may bring AM of surgery(Forms should be already notarized -we do not provide this service).(08-03-13 Advance directive documents packets x2 -given per request.)  Remember: Follow any bowel prep instructions per MD office.   Do not eat food:After Midnight.  May have clear liquids:up to 6 Hours before arrival. Nothing after : 0700 AM  Clear liquids include soda, tea, black coffee, apple or grape juice, broth.  Take these medicines the morning of surgery with A SIP OF WATER: Omeprazole. Do not take any Diabetic meds AM of. Use warfarin and Lovenox as instructed per MD office instructions.   Do not wear jewelry, make-up or nail polish.  Do not wear lotions, powders, or perfumes. You may wear deodorant.  Do not shave 48 hours(2 days) prior to first CHG shower(legs and under arms).(Shaving face and neck okay.)  Do not bring valuables to the hospital.(Hospital is not responsible for lost valuables).  Contacts, dentures or removable bridgework, body piercing, hair pins may not be worn into surgery.  Leave suitcase in the car. After surgery it may be brought to your room.  For patients admitted to the hospital, checkout time is 11:00 AM the day of discharge.(Restricted visitors-Any Persons displaying flu-like symptoms or illness).    Patients discharged the day of surgery will not be allowed to drive home. Must have responsible person with you x 24 hours once discharged.  Name and phone number of your driver:  [name] [phone] h/ [phone] cell Special Instructions: CHG(Chlorhedine 4%-"Hibiclens","Betasept","Aplicare") Shower Use Special  Wash: see special instructions.(avoid face and genitals)     Failure to follow these instructions may result in Cancellation of your surgery.   Patient signature_______________________________________________________

## 2013-08-07 ENCOUNTER — Encounter (HOSPITAL_COMMUNITY): Payer: Self-pay

## 2013-08-07 ENCOUNTER — Encounter (HOSPITAL_BASED_OUTPATIENT_CLINIC_OR_DEPARTMENT_OTHER): Payer: Medicare Other

## 2013-08-07 ENCOUNTER — Encounter (HOSPITAL_COMMUNITY): Payer: Medicare Other | Attending: Hematology and Oncology

## 2013-08-07 VITALS — BP 137/67 | HR 70 | Temp 97.5°F | Resp 18 | Wt 186.2 lb

## 2013-08-07 DIAGNOSIS — Z09 Encounter for follow-up examination after completed treatment for conditions other than malignant neoplasm: Secondary | ICD-10-CM | POA: Insufficient documentation

## 2013-08-07 DIAGNOSIS — E119 Type 2 diabetes mellitus without complications: Secondary | ICD-10-CM | POA: Diagnosis not present

## 2013-08-07 DIAGNOSIS — Z85828 Personal history of other malignant neoplasm of skin: Secondary | ICD-10-CM | POA: Diagnosis not present

## 2013-08-07 DIAGNOSIS — I10 Essential (primary) hypertension: Secondary | ICD-10-CM | POA: Diagnosis not present

## 2013-08-07 DIAGNOSIS — H269 Unspecified cataract: Secondary | ICD-10-CM | POA: Diagnosis not present

## 2013-08-07 DIAGNOSIS — N400 Enlarged prostate without lower urinary tract symptoms: Secondary | ICD-10-CM | POA: Insufficient documentation

## 2013-08-07 DIAGNOSIS — C179 Malignant neoplasm of small intestine, unspecified: Secondary | ICD-10-CM | POA: Diagnosis not present

## 2013-08-07 DIAGNOSIS — C8589 Other specified types of non-Hodgkin lymphoma, extranodal and solid organ sites: Secondary | ICD-10-CM

## 2013-08-07 DIAGNOSIS — Z8500 Personal history of malignant neoplasm of unspecified digestive organ: Secondary | ICD-10-CM | POA: Diagnosis not present

## 2013-08-07 DIAGNOSIS — N210 Calculus in bladder: Secondary | ICD-10-CM | POA: Insufficient documentation

## 2013-08-07 DIAGNOSIS — I4891 Unspecified atrial fibrillation: Secondary | ICD-10-CM | POA: Diagnosis not present

## 2013-08-07 DIAGNOSIS — C8581 Other specified types of non-Hodgkin lymphoma, lymph nodes of head, face, and neck: Secondary | ICD-10-CM | POA: Diagnosis not present

## 2013-08-07 DIAGNOSIS — C49A0 Gastrointestinal stromal tumor, unspecified site: Secondary | ICD-10-CM

## 2013-08-07 DIAGNOSIS — Z7901 Long term (current) use of anticoagulants: Secondary | ICD-10-CM | POA: Insufficient documentation

## 2013-08-07 DIAGNOSIS — C8590 Non-Hodgkin lymphoma, unspecified, unspecified site: Secondary | ICD-10-CM

## 2013-08-07 LAB — LACTATE DEHYDROGENASE: LDH: 116 U/L (ref 94–250)

## 2013-08-07 NOTE — Progress Notes (Signed)
Labs drawn today for ldh,[id number]

## 2013-08-07 NOTE — Progress Notes (Signed)
[facility]  OFFICE PROGRESS Jolee Ewing, MD [name] [place] [address]  DIAGNOSIS: NHL (non-Hodgkin's lymphoma) - Plan: Lactate dehydrogenase, Beta 2 microglobuline, serum  GIST (gastrointestinal stromal tumor), malignant  Chief Complaint  Patient presents with  . Large cell lymphoma  . GIST    CURRENT THERAPY: Watchful expectation and surveillance  INTERVAL HISTORY: [name] 80 y.o. 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 He has had problems with urinary frequency as well as nocturia with incomplete imaging of his bladder as well. He denies any hematuria, fine pain, fever, or night sweats. He also denies any easy satiety, new lymphadenopathy, lower extremity swelling or redness, skin rash, cough, wheezing, headache, or seizures.  MEDICAL HISTORY: Past Medical History  Diagnosis Date  . Hypertension   . Atrial fibrillation     Dr. Harl Bowie- [facility] follows saw 11'14  . 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 Margot Chimes, thus far without recurrence.   . NHL (non-Hodgkin's lymphoma) 08/18/2011    Diffuse large B-cell lymphoma, clinically stage IIIA, CD20 positive, status post cervical lymph node biopsy 08/03/2003 on the left. PET scan was also positive in the spleen and small bowel region, but bone marrow aspiration and biopsy were negative. So he essentially had stage IIIAs. He received R-CHOP x6 cycles with CR established by PET scan criteria on 11/23/2003 with no evidence for relapse th  . Cataracts, both eyes      surgery planned May 2015  . Bladder stones     tx. with oral meds and antibiotics, now surgery planned  . Diabetes mellitus without complication     INTERIM HISTORY: has HYPERTENSION, UNSPECIFIED; ATRIAL FIBRILLATION; Encounter for long-term (current) use of anticoagulants; NHL (non-Hodgkin's lymphoma); GIST (gastrointestinal stromal tumor), malignant; and Encounter for therapeutic drug monitoring on his problem list.    ALLERGIES:  has No Known Allergies.  MEDICATIONS: has a current medication list which includes the following prescription(s): acetaminophen, amitriptyline, cranberry, diltiazem, finasteride, glipizide, linagliptin, lisinopril-hydrochlorothiazide, omeprazole, potassium chloride, trimethoprim, and warfarin.  SURGICAL HISTORY:  Past Surgical History  Procedure Laterality Date  . Cholecystectomy    . Incision and drainage perirectal abscess    . Portacath placement      insertion and removal -last chemotherapy 10 yrs ago  . Lymph node dissection Left     '05  . Port-a-cath removal      FAMILY HISTORY: family history is not on file.  SOCIAL HISTORY:  reports that he has never smoked. He has never used smokeless tobacco. He reports that he does not drink alcohol or use illicit drugs.  REVIEW OF SYSTEMS:  Other than that discussed above is noncontributory.  PHYSICAL EXAMINATION: ECOG PERFORMANCE STATUS: 1 - Symptomatic but completely ambulatory  Blood pressure 137/67, pulse 70, temperature 97.5 F (36.4 C), temperature source Oral, resp. rate 18, weight 186 lb 3.2 oz (84.46 kg).  GENERAL:alert, no distress and comfortable SKIN: skin color, texture, turgor are normal, no rashes or significant lesions EYES: PERLA; Conjunctiva are pink and non-injected, sclera clear SINUSES: No redness or tenderness over maxillary  or ethmoid sinuses OROPHARYNX:no exudate, no erythema on lips, buccal mucosa, or tongue. NECK: supple, thyroid normal size, non-tender, without  nodularity. No masses CHEST: Increased AP diameter with no gynecomastia. LYMPH:  no palpable lymphadenopathy in the cervical, axillary or inguinal LUNGS: clear to auscultation and percussion with normal breathing effort HEART: regular rate & rhythm and no murmurs. ABDOMEN:abdomen soft, non-tender and normal bowel sounds MUSCULOSKELETAL:no cyanosis of digits and no clubbing. Range of motion normal.  NEURO: alert & oriented x 3 with fluent speech, hearing deficit.   LABORATORY DATA: Hospital Outpatient Visit on 08/03/2013  Component Date Value Ref Range Status  . Sodium 08/03/2013 139  137 - 147 mEq/L Final  . Potassium 08/03/2013 4.9  3.7 - 5.3 mEq/L Final  . Chloride 08/03/2013 101  96 - 112 mEq/L Final  . CO2 08/03/2013 29  19 - 32 mEq/L Final  . Glucose, Bld 08/03/2013 132* 70 - 99 mg/dL Final  . BUN 08/03/2013 14  6 - 23 mg/dL Final  . Creatinine, Ser 08/03/2013 1.04  0.50 - 1.35 mg/dL Final  . Calcium 08/03/2013 10.1  8.4 - 10.5 mg/dL Final  . GFR calc non Af Amer 08/03/2013 62* >90 mL/min Final  . GFR calc Af Amer 08/03/2013 72* >90 mL/min Final   Comment: (NOTE)                          The eGFR has been calculated using the CKD EPI equation.                          This calculation has not been validated in all clinical situations.                          eGFR's persistently <90 mL/min signify possible Chronic Kidney                          Disease.  Marland Kitchen Prothrombin Time 08/03/2013 22.3* 11.6 - 15.2 seconds Final  . INR 08/03/2013 2.03* 0.00 - 1.49 Final  . aPTT 08/03/2013 37  24 - 37 seconds Final   Comment:                                 IF BASELINE aPTT IS ELEVATED,                          SUGGEST PATIENT RISK ASSESSMENT                          BE USED TO DETERMINE APPROPRIATE                          ANTICOAGULANT THERAPY.  . WBC 08/03/2013 6.8  4.0 - 10.5 K/uL Final  . RBC 08/03/2013 5.04  4.22 - 5.81 MIL/uL Final  . Hemoglobin 08/03/2013 15.5  13.0 - 17.0 g/dL  Final  . HCT 08/03/2013 43.8  39.0 - 52.0 % Final  . MCV 08/03/2013 86.9  78.0 - 100.0 fL Final  . MCH 08/03/2013 30.8  26.0 - 34.0 pg Final  . MCHC 08/03/2013 35.4  30.0 - 36.0 g/dL Final  . RDW 08/03/2013 13.6  11.5 - 15.5 % Final  . Platelets  08/03/2013 202  150 - 400 K/uL Final  Orders Only on 07/27/2013  Component Date Value Ref Range Status  . Sodium 07/27/2013 140  135 - 145 mEq/L Final  . Potassium 07/27/2013 5.0  3.5 - 5.3 mEq/L Final  . Chloride 07/27/2013 100  96 - 112 mEq/L Final  . CO2 07/27/2013 30  19 - 32 mEq/L Final  . Glucose, Bld 07/27/2013 143* 70 - 99 mg/dL Final  . BUN 07/27/2013 18  6 - 23 mg/dL Final  . Creat 07/27/2013 1.14  0.50 - 1.35 mg/dL Final  . Calcium 07/27/2013 10.2  8.4 - 10.5 mg/dL Final  . GFR, Est African American 07/27/2013 66   Final  . GFR, Est Non African American 07/27/2013 58*  Final   Comment:                            The estimated GFR is a calculation valid for adults (>=80 years old)                          that uses the CKD-EPI algorithm to adjust for age and sex. It is                            not to be used for children, pregnant women, hospitalized patients,                             patients on dialysis, or with rapidly changing kidney function.                          According to the NKDEP, eGFR >89 is normal, 60-89 shows mild                          impairment, 30-59 shows moderate impairment, 15-29 shows severe                          impairment and <15 is ESRD.                             Anti-coag visit on 07/27/2013  Component Date Value Ref Range Status  . INR 07/27/2013 2.4   Final    PATHOLOGY: No new pathology.  Urinalysis No results found for this basename: colorurine,  appearanceur,  labspec,  phurine,  glucoseu,  hgbur,  bilirubinur,  ketonesur,  proteinur,  urobilinogen,  nitrite,  leukocytesur    RADIOGRAPHIC STUDIES: Dg Chest 2 View  08/03/2013   CLINICAL DATA:  Preoperative for bladder stone.   Hypertension.  EXAM: CHEST  2 VIEW  COMPARISON:  March 10, 2011, Sep 12, 2010  FINDINGS: The heart size and mediastinal contours are within normal limits. There is no focal infiltrate, pulmonary edema, or pleural effusion. Small calcified granuloma is identified in the left lung base unchanged. The visualized skeletal structures are unremarkable. Prior cholecystectomy clips are noted.  IMPRESSION: No active cardiopulmonary disease.   Electronically Signed   By: Abelardo Diesel M.D.   On: 08/03/2013 13:26    ASSESSMENT:  #1.GIST of the small bowel, 4.5 cm in size, with intermediate prognostic grade, found on his PET scan in the small bowel status  post resection by Dr. Margot Chimes in October 2005  #2. diffuse large B-cell lymphoma clinically stage IIIAS STATUS post cervical lymph node biopsy on 08/03/2003. His lymphoma CD20 positive and PET scan showed positivity in the spleen. Bone marrow aspiration and biopsy were negative however. He received R. CHOP x6 cycles with a complete remission established by PET scan criteria on 11/23/2003. He completed 6 full cycles total. He remains without disease recurrence. He remains free of B. ymptomatology.  #3. Atrial fibrillation on warfarin, controlled ventricular response.  #4. Hypertension, controlled.  #6. Basal cell carcinoma, status post recent resection from nose, healing well.  #7. Diabetes mellitus, non-insulin requiring, controlled. #8. Bladder stones and benign prostatic hypertrophy, for lithotripsy and TURP on 08/11/2013    PLAN:  #1. Patient will stop warfarin after today's dose and begin Lovenox subcutaneously until cystoscopy on 08/11/2013 and then resume warfarin the day after. #2. Continue watchful expectation if lab tests are appropriate. #3. Followup in 3 months with CBC, chem profile, LDH, and beta-2 microglobulin.   All questions were answered. The patient knows to call the clinic with any problems, questions or concerns. We can certainly see  the patient much sooner if necessary.   I spent 25 minutes counseling the patient face to face. The total time spent in the appointment was 30 minutes.    [name], MD 08/07/2013 9:50 AM  DISCLAIMER:  This note was dictated with voice recognition software.  Similar sounding words can inadvertently be transcribed inaccurately and may not be corrected upon review.

## 2013-08-07 NOTE — Patient Instructions (Signed)
[facility] Discharge Instructions  RECOMMENDATIONS MADE BY THE CONSULTANT AND ANY TEST RESULTS WILL BE SENT TO YOUR REFERRING PHYSICIAN.  EXAM FINDINGS BY THE PHYSICIAN TODAY AND SIGNS OR SYMPTOMS TO REPORT TO CLINIC OR PRIMARY PHYSICIAN: Exam and findings as discussed by Dr. Barnet Glasgow.  Report fevers, night sweats, new lumps, unexplained weight loss, etc.  MEDICATIONS PRESCRIBED:  none  INSTRUCTIONS/FOLLOW-UP: Follow-up in 3 months with blood work and office visit.  Thank you for choosing [facility] to provide your oncology and hematology care.  To afford each patient quality time with our providers, please arrive at least 15 minutes before your scheduled appointment time.  With your help, our goal is to use those 15 minutes to complete the necessary work-up to ensure our physicians have the information they need to help with your evaluation and healthcare recommendations.    Effective January 1st, 2014, we ask that you re-schedule your appointment with our physicians should you arrive 10 or more minutes late for your appointment.  We strive to give you quality time with our providers, and arriving late affects you and other patients whose appointments are after yours.    Again, thank you for choosing [facility].  Our hope is that these requests will decrease the amount of time that you wait before being seen by our physicians.       _____________________________________________________________  Should you have questions after your visit to [facility], please contact our office at [phone] between the hours of 8:30 a.m. and 5:00 p.m.  Voicemails left after 4:30 p.m. will not be returned until the following business day.  For prescription refill requests, have your pharmacy contact our office with your prescription refill request.

## 2013-08-09 LAB — BETA 2 MICROGLOBULIN, SERUM: Beta-2 Microglobulin: 3.19 mg/L — ABNORMAL HIGH (ref <=2.51)

## 2013-08-10 ENCOUNTER — Observation Stay (HOSPITAL_COMMUNITY)
Admission: RE | Admit: 2013-08-10 | Discharge: 2013-08-11 | Disposition: A | Discharge status: Home / Self Care | Payer: Medicare Other | Source: Ambulatory Visit | Attending: Urology | Admitting: Urology

## 2013-08-10 ENCOUNTER — Ambulatory Visit (HOSPITAL_COMMUNITY): Payer: Medicare Other | Admitting: Anesthesiology

## 2013-08-10 ENCOUNTER — Encounter (HOSPITAL_COMMUNITY): Payer: Medicare Other | Admitting: Anesthesiology

## 2013-08-10 ENCOUNTER — Encounter (HOSPITAL_COMMUNITY): Payer: Self-pay | Admitting: Anesthesiology

## 2013-08-10 ENCOUNTER — Encounter (HOSPITAL_COMMUNITY)
Admission: RE | Disposition: A | Discharge status: Home / Self Care | Payer: Self-pay | Source: Ambulatory Visit | Attending: Urology

## 2013-08-10 DIAGNOSIS — Z79899 Other long term (current) drug therapy: Secondary | ICD-10-CM | POA: Diagnosis not present

## 2013-08-10 DIAGNOSIS — C8590 Non-Hodgkin lymphoma, unspecified, unspecified site: Secondary | ICD-10-CM

## 2013-08-10 DIAGNOSIS — Z87440 Personal history of urinary (tract) infections: Secondary | ICD-10-CM | POA: Insufficient documentation

## 2013-08-10 DIAGNOSIS — C61 Malignant neoplasm of prostate: Secondary | ICD-10-CM | POA: Diagnosis not present

## 2013-08-10 DIAGNOSIS — Z9089 Acquired absence of other organs: Secondary | ICD-10-CM | POA: Insufficient documentation

## 2013-08-10 DIAGNOSIS — H269 Unspecified cataract: Secondary | ICD-10-CM | POA: Insufficient documentation

## 2013-08-10 DIAGNOSIS — Z5181 Encounter for therapeutic drug level monitoring: Secondary | ICD-10-CM

## 2013-08-10 DIAGNOSIS — C49A0 Gastrointestinal stromal tumor, unspecified site: Secondary | ICD-10-CM

## 2013-08-10 DIAGNOSIS — N400 Enlarged prostate without lower urinary tract symptoms: Secondary | ICD-10-CM | POA: Diagnosis not present

## 2013-08-10 DIAGNOSIS — N320 Bladder-neck obstruction: Secondary | ICD-10-CM | POA: Diagnosis not present

## 2013-08-10 DIAGNOSIS — N138 Other obstructive and reflux uropathy: Secondary | ICD-10-CM | POA: Diagnosis present

## 2013-08-10 DIAGNOSIS — Z9079 Acquired absence of other genital organ(s): Secondary | ICD-10-CM

## 2013-08-10 DIAGNOSIS — Z87898 Personal history of other specified conditions: Secondary | ICD-10-CM | POA: Insufficient documentation

## 2013-08-10 DIAGNOSIS — Z85028 Personal history of other malignant neoplasm of stomach: Secondary | ICD-10-CM | POA: Diagnosis not present

## 2013-08-10 DIAGNOSIS — N401 Enlarged prostate with lower urinary tract symptoms: Secondary | ICD-10-CM

## 2013-08-10 DIAGNOSIS — Z7901 Long term (current) use of anticoagulants: Secondary | ICD-10-CM

## 2013-08-10 DIAGNOSIS — E119 Type 2 diabetes mellitus without complications: Secondary | ICD-10-CM | POA: Insufficient documentation

## 2013-08-10 DIAGNOSIS — I4891 Unspecified atrial fibrillation: Secondary | ICD-10-CM | POA: Diagnosis not present

## 2013-08-10 DIAGNOSIS — N210 Calculus in bladder: Secondary | ICD-10-CM | POA: Diagnosis not present

## 2013-08-10 DIAGNOSIS — I10 Essential (primary) hypertension: Secondary | ICD-10-CM | POA: Diagnosis not present

## 2013-08-10 HISTORY — PX: CYSTOSCOPY WITH LITHOLAPAXY: SHX1425

## 2013-08-10 HISTORY — PX: TRANSURETHRAL RESECTION OF PROSTATE: SHX73

## 2013-08-10 HISTORY — DX: Acquired absence of other genital organ(s): Z90.79

## 2013-08-10 LAB — GLUCOSE, CAPILLARY
Glucose-Capillary: 125 mg/dL — ABNORMAL HIGH (ref 70–99)
Glucose-Capillary: 109 mg/dL — ABNORMAL HIGH (ref 70–99)
Glucose-Capillary: 194 mg/dL — ABNORMAL HIGH (ref 70–99)

## 2013-08-10 LAB — PROTIME-INR
INR: 1.09 (ref 0.00–1.49)
Prothrombin Time: 13.9 seconds (ref 11.6–15.2)

## 2013-08-10 SURGERY — TRANSURETHRAL RESECTION OF THE PROSTATE WITH GYRUS INSTRUMENTS
Anesthesia: General | Site: Penis

## 2013-08-10 MED ORDER — FENTANYL CITRATE 0.05 MG/ML IJ SOLN
INTRAMUSCULAR | Status: DC | PRN
  Administered 2013-08-10 (×2): 50 ug via INTRAVENOUS

## 2013-08-10 MED ORDER — ACETAMINOPHEN 500 MG PO TABS: 500.0000 mg | ORAL_TABLET | Freq: Four times a day (QID) | ORAL | Status: DC | PRN

## 2013-08-10 MED ORDER — CEFAZOLIN SODIUM-DEXTROSE 2-3 GM-% IV SOLR
2.0000 g | INTRAVENOUS | Status: AC
  Administered 2013-08-10: 2 g via INTRAVENOUS

## 2013-08-10 MED ORDER — FENTANYL CITRATE 0.05 MG/ML IJ SOLN: 25.0000 ug | INTRAMUSCULAR | Status: DC | PRN

## 2013-08-10 MED ORDER — SODIUM CHLORIDE 0.9 % IR SOLN
Status: DC | PRN
  Administered 2013-08-10: 18000 mL

## 2013-08-10 MED ORDER — GLIPIZIDE 5 MG PO TABS
5.0000 mg | ORAL_TABLET | Freq: Every day | ORAL | Status: DC
  Administered 2013-08-11: 5 mg via ORAL
  Filled 2013-08-10 (×2): qty 1

## 2013-08-10 MED ORDER — PROPOFOL 10 MG/ML IV BOLUS
INTRAVENOUS | Status: AC
  Filled 2013-08-10: qty 20

## 2013-08-10 MED ORDER — LACTATED RINGERS IV SOLN
INTRAVENOUS | Status: DC | PRN
  Administered 2013-08-10: 09:00:00 via INTRAVENOUS

## 2013-08-10 MED ORDER — SULFAMETHOXAZOLE-TMP DS 800-160 MG PO TABS
1.0000 | ORAL_TABLET | Freq: Two times a day (BID) | ORAL | Status: DC
  Administered 2013-08-10 – 2013-08-11 (×3): 1 via ORAL
  Filled 2013-08-10 (×4): qty 1

## 2013-08-10 MED ORDER — POTASSIUM CHLORIDE CRYS ER 10 MEQ PO TBCR
10.0000 meq | EXTENDED_RELEASE_TABLET | Freq: Every day | ORAL | Status: DC
  Administered 2013-08-10 – 2013-08-11 (×2): 10 meq via ORAL
  Filled 2013-08-10 (×2): qty 1

## 2013-08-10 MED ORDER — MIDAZOLAM HCL 2 MG/2ML IJ SOLN
INTRAMUSCULAR | Status: AC
  Filled 2013-08-10: qty 2

## 2013-08-10 MED ORDER — LISINOPRIL-HYDROCHLOROTHIAZIDE 20-12.5 MG PO TABS: 1.0000 | ORAL_TABLET | Freq: Every morning | ORAL | Status: DC

## 2013-08-10 MED ORDER — HYDROCHLOROTHIAZIDE 12.5 MG PO CAPS
12.5000 mg | ORAL_CAPSULE | Freq: Every day | ORAL | Status: DC
  Administered 2013-08-10 – 2013-08-11 (×2): 12.5 mg via ORAL
  Filled 2013-08-10 (×2): qty 1

## 2013-08-10 MED ORDER — PANTOPRAZOLE SODIUM 40 MG PO TBEC
40.0000 mg | DELAYED_RELEASE_TABLET | Freq: Every day | ORAL | Status: DC
  Administered 2013-08-10 – 2013-08-11 (×2): 40 mg via ORAL
  Filled 2013-08-10: qty 1

## 2013-08-10 MED ORDER — CISATRACURIUM BESYLATE 20 MG/10ML IV SOLN
INTRAVENOUS | Status: AC
  Filled 2013-08-10: qty 10

## 2013-08-10 MED ORDER — LACTATED RINGERS IV SOLN: INTRAVENOUS | Status: DC

## 2013-08-10 MED ORDER — HYDROCODONE-ACETAMINOPHEN 5-325 MG PO TABS
1.0000 | ORAL_TABLET | ORAL | Status: DC | PRN
  Administered 2013-08-10: 2 via ORAL
  Administered 2013-08-11: 1 via ORAL
  Filled 2013-08-10: qty 1
  Filled 2013-08-10: qty 2

## 2013-08-10 MED ORDER — CEFAZOLIN SODIUM-DEXTROSE 2-3 GM-% IV SOLR
INTRAVENOUS | Status: AC
  Filled 2013-08-10: qty 50

## 2013-08-10 MED ORDER — AMITRIPTYLINE HCL 25 MG PO TABS
25.0000 mg | ORAL_TABLET | Freq: Every day | ORAL | Status: DC
  Administered 2013-08-10: 25 mg via ORAL
  Filled 2013-08-10 (×2): qty 1

## 2013-08-10 MED ORDER — BACITRACIN ZINC 500 UNIT/GM EX OINT
TOPICAL_OINTMENT | CUTANEOUS | Status: AC
  Filled 2013-08-10: qty 28.35

## 2013-08-10 MED ORDER — LINAGLIPTIN 5 MG PO TABS
5.0000 mg | ORAL_TABLET | Freq: Every day | ORAL | Status: DC
  Administered 2013-08-10 – 2013-08-11 (×2): 5 mg via ORAL
  Filled 2013-08-10 (×2): qty 1

## 2013-08-10 MED ORDER — METOCLOPRAMIDE HCL 5 MG/ML IJ SOLN
INTRAMUSCULAR | Status: AC
  Filled 2013-08-10: qty 2

## 2013-08-10 MED ORDER — LISINOPRIL 20 MG PO TABS
20.0000 mg | ORAL_TABLET | Freq: Every day | ORAL | Status: DC
  Administered 2013-08-10 – 2013-08-11 (×2): 20 mg via ORAL
  Filled 2013-08-10 (×2): qty 1

## 2013-08-10 MED ORDER — PROPOFOL 10 MG/ML IV BOLUS
INTRAVENOUS | Status: DC | PRN
  Administered 2013-08-10: 200 mg via INTRAVENOUS

## 2013-08-10 MED ORDER — ONDANSETRON HCL 4 MG/2ML IJ SOLN
INTRAMUSCULAR | Status: DC | PRN
  Administered 2013-08-10: 4 mg via INTRAVENOUS

## 2013-08-10 MED ORDER — 0.9 % SODIUM CHLORIDE (POUR BTL) OPTIME
TOPICAL | Status: DC | PRN
  Administered 2013-08-10: 2000 mL

## 2013-08-10 MED ORDER — DOCUSATE SODIUM 100 MG PO CAPS
100.0000 mg | ORAL_CAPSULE | Freq: Two times a day (BID) | ORAL | Status: DC
  Administered 2013-08-10 – 2013-08-11 (×3): 100 mg via ORAL
  Filled 2013-08-10 (×4): qty 1

## 2013-08-10 MED ORDER — BELLADONNA ALKALOIDS-OPIUM 16.2-60 MG RE SUPP: 1.0000 | Freq: Four times a day (QID) | RECTAL | Status: DC | PRN

## 2013-08-10 MED ORDER — SODIUM CHLORIDE 0.45 % IV SOLN
INTRAVENOUS | Status: DC
  Administered 2013-08-10: 1000 mL via INTRAVENOUS
  Administered 2013-08-11: 02:00:00 via INTRAVENOUS

## 2013-08-10 MED ORDER — ONDANSETRON HCL 4 MG/2ML IJ SOLN
INTRAMUSCULAR | Status: AC
  Filled 2013-08-10: qty 2

## 2013-08-10 MED ORDER — DEXAMETHASONE SODIUM PHOSPHATE 10 MG/ML IJ SOLN
INTRAMUSCULAR | Status: AC
  Filled 2013-08-10: qty 1

## 2013-08-10 MED ORDER — ONDANSETRON HCL 4 MG/2ML IJ SOLN: 4.0000 mg | INTRAMUSCULAR | Status: DC | PRN

## 2013-08-10 MED ORDER — FENTANYL CITRATE 0.05 MG/ML IJ SOLN
INTRAMUSCULAR | Status: AC
  Filled 2013-08-10: qty 5

## 2013-08-10 MED ORDER — DILTIAZEM HCL ER 180 MG PO CP24
180.0000 mg | ORAL_CAPSULE | Freq: Every evening | ORAL | Status: DC
  Administered 2013-08-10: 180 mg via ORAL
  Filled 2013-08-10 (×2): qty 1

## 2013-08-10 MED ORDER — SULFAMETHOXAZOLE-TMP DS 800-160 MG PO TABS: 1.0000 | ORAL_TABLET | Freq: Two times a day (BID) | ORAL | Status: DC

## 2013-08-10 SURGICAL SUPPLY — 23 items
BAG URINE DRAINAGE (UROLOGICAL SUPPLIES) ×2 IMPLANT
BAG URO CATCHER STRL LF (DRAPE) ×2 IMPLANT
CARTRIDGE STONEBREAK CO2 KIDNE (ELECTROSURGICAL) ×3 IMPLANT
CATH FOLEY 3WAY 30CC 22FR (CATHETERS) ×2 IMPLANT
CLOTH BEACON ORANGE TIMEOUT ST (SAFETY) ×2 IMPLANT
DRAPE CAMERA CLOSED 9X96 (DRAPES) ×2 IMPLANT
ELECT BUTTON HF 24-28F 2 30DE (ELECTRODE) ×2 IMPLANT
ELECT HF RESECT BIPO 24F 45 ND (CUTTING LOOP) ×2 IMPLANT
ELECT LOOP MED HF 24F 12D (CUTTING LOOP) ×2 IMPLANT
ELECT LOOP MED HF 24F 12D CBL (CLIP) ×1 IMPLANT
EVACUATOR MICROVAS BLADDER (UROLOGICAL SUPPLIES) ×2 IMPLANT
GLOVE BIOGEL M 8.0 STRL (GLOVE) ×2 IMPLANT
GOWN STRL REUS W/ TWL XL LVL3 (GOWN DISPOSABLE) ×1 IMPLANT
GOWN STRL REUS W/TWL XL LVL3 (GOWN DISPOSABLE) ×4 IMPLANT
HOLDER FOLEY CATH W/STRAP (MISCELLANEOUS) ×1 IMPLANT
IV NS IRRIG 3000ML ARTHROMATIC (IV SOLUTION) ×4 IMPLANT
KIT ASPIRATION TUBING (SET/KITS/TRAYS/PACK) ×2 IMPLANT
MANIFOLD NEPTUNE II (INSTRUMENTS) ×2 IMPLANT
PACK CYSTO (CUSTOM PROCEDURE TRAY) ×2 IMPLANT
PROBE PNEUMATIC 1.6MM (ELECTROSURGICAL) ×1 IMPLANT
SYR 30ML LL (SYRINGE) IMPLANT
SYRINGE IRR TOOMEY STRL 70CC (SYRINGE) IMPLANT
TUBING CONNECTING 10 (TUBING) ×2 IMPLANT

## 2013-08-10 NOTE — Transfer of Care (Signed)
Immediate Anesthesia Transfer of Care Note  Patient: [name]  Procedure(s) Performed: Procedure(s): TRANSURETHRAL RESECTION OF THE PROSTATE WITH GYRUS INSTRUMENTS (N/A) CYSTOSCOPY WITH LITHOLAPAXY WITH STONEBREAKER (N/A)  Patient Location: PACU  Anesthesia Type:General  Level of Consciousness: awake, alert , sedated and patient cooperative  Airway & Oxygen Therapy: Patient Spontanous Breathing and Patient connected to face mask oxygen  Post-op Assessment: Report given to PACU RN and Post -op Vital signs reviewed and stable  Post vital signs: Reviewed and stable  Complications: No apparent anesthesia complications

## 2013-08-10 NOTE — H&P (Signed)
Urology History and Physical Exam  CC: Large prostate, bladder srtones  HPI: 80 year old male presents for TUR-P and cystolithalopaxy for an obstructing prostate and bladder stones. His most recent cystoscopy in our [place] office revealed the above findings--a large prostate and 2 15 mm calculi. His most recent note is as follows:   I have been seeing this man intermittently since 2010.  At that time, he was having intermittent gross hematuria.  He also had an elevated PSA during an episode of bladder infection.  He has been noted to have 2 bladder stones.  I last saw him in late 2012.  He is not come back for follow-up since that time.  He does come in with 2 urinary tract infections over the past month or 2, with him feeling bad and having gross hematuria with these.  He is having urgency, frequency and urgency incontinence.  He has a slow stream and obstructive symptomatology.  He has been noted on his most recent cystoscopy over 2 years ago to have 2 jack stones in his bladder as well as outlet obstruction from his prostate.    PMH: Past Medical History  Diagnosis Date  . Hypertension   . Atrial fibrillation     Dr. Harl Bowie- [facility] follows saw 11'14  . 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 Margot Chimes, thus far without recurrence.   . NHL (non-Hodgkin's lymphoma) 08/18/2011    Diffuse large B-cell lymphoma, clinically stage IIIA, CD20 positive, status post cervical lymph node biopsy 08/03/2003 on the left. PET scan was also positive in the spleen and small bowel region, but bone marrow aspiration and biopsy were negative. So he essentially had stage IIIAs. He received R-CHOP x6 cycles with CR established by PET scan criteria on 11/23/2003 with no  evidence for relapse th  . Cataracts, both eyes     surgery planned May 2015  . Bladder stones     tx. with oral meds and antibiotics, now surgery planned  . Diabetes mellitus without complication     PSH: Past Surgical History  Procedure Laterality Date  . Cholecystectomy    . Incision and drainage perirectal abscess    . Portacath placement      insertion and removal -last chemotherapy 10 yrs ago  . Lymph node dissection Left     '05  . Port-a-cath removal      Allergies: No Known Allergies  Medications: No prescriptions prior to admission     Social History: History   Social History  . Marital Status: Married    Spouse Name: N/A    Number of Children: N/A  . Years of Education: N/A   Occupational History  . retired     Hotel manager   Social History Main Topics  . Smoking status: Never Smoker   . Smokeless tobacco: Never Used  . Alcohol Use: No  . Drug Use: No  . Sexual Activity: Not Currently   Other Topics Concern  . Not on file   Social History Narrative  . No narrative on file    Family History: No family history on file.  Review of Systems: Genitourinary: urinary frequency, feelings of urinary urgency, nocturia, incontinence, weak urinary stream, urinary stream starts and stops, hematuria and erectile dysfunction.  Constitutional: fever and feeling  poorly (malaise).   Physical Exam: @VITALS2 @ General: No acute distress.  Awake. Head:  Normocephalic.  Atraumatic. ENT:  EOMI.  Mucous membranes moist Neck:  Supple.  No lymphadenopathy. CV:  S1 present. S2 present. Regular rate. Pulmonary: Equal effort bilaterally.  Clear to auscultation bilaterally. Abdomen: Soft. Non tender to palpation. Skin:  Normal turgor.  No visible rash. Extremity: No gross deformity of bilateral upper extremities.  No gross deformity of                             lower extremities. Neurologic: Alert. Appropriate mood.  Studies:  No results found for this basename:  HGB, WBC, PLT,  in the last 72 hours  No results found for this basename: NA, K, CL, CO2, BUN, CREATININE, CALCIUM, MAGNESIUM, GFRNONAA, GFRAA,  in the last 72 hours   No results found for this basename: PT, INR, APTT,  in the last 72 hours   No components found with this basename: ABG,     Assessment:  BPH with 2 15 mm bladder calculi  Plan: TURP, cystolithalopaxy

## 2013-08-10 NOTE — Op Note (Signed)
Preoperative diagnosis: 1. Bladder outlet obstruction secondary to BPH 2. Bladder calculi, aggregate diameter 3 cm  Postoperative diagnosis:  1. Bladder outlet obstruction secondary to BPH 2. Same  Procedure:  1. Cystoscopy 2. Transurethral resection of the prostate 3. 3. Cystolitholapaxy, 3 cm bladder stone  Surgeon: Lillette Boxer. Jobeth Pangilinan, M.D.  Anesthesia: General  Complications: None  Drain: Foley catheter  EBL: Minimal  Specimens: 1. Stone fragments, to the family 2. TURP chips  Disposition of specimens: Pathology  Indication: [name] is a patient with bladder outlet obstruction secondary to benign prostatic hyperplasia. Additionally, he has symptomatic bladder calculi. After reviewing the management options for treatment, he elected to proceed with the above surgical procedure(s). We have discussed the potential benefits and risks of the procedure, side effects of the proposed treatment, the likelihood of the patient achieving the goals of the procedure, and any potential problems that might occur during the procedure or recuperation. Informed consent has been obtained.  Description of procedure:  The patient was identified in the holding area.He received preoperative antibiotics. He was then taken to the operating room. General anesthetic was administered.  The patient was then placed in the dorsal lithotomy position, prepped and draped in the usual sterile fashion. Timeout was then performed.  A resectoscope sheath was placed using the obturator, and the resectoscope, loop and telescope were placed.  The bladder was then systematically examined in its entirety. There was no evidence of  tumors, stones, or other mucosal pathology. There were 2 [name] stones present in the bladder, both about 1.5 cm in size.  The ureteral orifices were identified and marked so as to be avoided during the procedure.  I placed the resectoscope sheath using the visual obturator. I used  the Stonebreaker to fragment the 2 bladder calculi into small calculi that were then easily irrigated from the bladder. Quite a few small fragments were remaining in the multiple cellules that he had. I did the best that could 2 remove these fragments. However, there were small amount that I felt that they would easily pass following resection of his prostate.  The prostate adenoma was then resected utilizing loop cautery resection with the bipolar cutting loop.  The prostate adenoma from the bladder neck back to the verumontanum was resected beginning at the six o'clock position and then extended to include the right and left lobes of the prostate and anterior prostate, respectively. Care was taken not to resect distal to the verumontanum.  Hemostasis was then achieved with the cautery and the bladder was emptied and reinspected with no significant bleeding noted at the end of the procedure.  Resected chips were irrigated from the bladder with the evacuator and sent to pathology.  A 3 way catheter was then placed into the bladder and placed on continuous bladder irrigation.  The patient appeared to tolerate the procedure well and without complications. The patient was able to be awakened and transferred to the recovery unit in satisfactory condition. He tolerated the procedure well.

## 2013-08-10 NOTE — Anesthesia Preprocedure Evaluation (Signed)
Anesthesia Evaluation  Patient identified by MRN, date of birth, ID band Patient awake    Reviewed: Allergy & Precautions, H&P , NPO status , Patient's Chart, lab work & pertinent test results  Airway Mallampati: II TM Distance: >3 FB Neck ROM: Full    Dental no notable dental hx.    Pulmonary neg pulmonary ROS,  breath sounds clear to auscultation  Pulmonary exam normal       Cardiovascular hypertension, Pt. on medications Rhythm:Regular Rate:Normal     Neuro/Psych negative neurological ROS  negative psych ROS   GI/Hepatic negative GI ROS, Neg liver ROS,   Endo/Other  diabetes, Type 2, Oral Hypoglycemic Agents  Renal/GU negative Renal ROS  negative genitourinary   Musculoskeletal negative musculoskeletal ROS (+)   Abdominal   Peds negative pediatric ROS (+)  Hematology negative hematology ROS (+)   Anesthesia Other Findings   Reproductive/Obstetrics negative OB ROS                           Anesthesia Physical Anesthesia Plan  ASA: III  Anesthesia Plan: General   Post-op Pain Management:    Induction: Intravenous  Airway Management Planned: LMA  Additional Equipment:   Intra-op Plan:   Post-operative Plan: Extubation in OR  Informed Consent: I have reviewed the patients History and Physical, chart, labs and discussed the procedure including the risks, benefits and alternatives for the proposed anesthesia with the patient or authorized representative who has indicated his/her understanding and acceptance.   Dental advisory given  Plan Discussed with: CRNA  Anesthesia Plan Comments:         Anesthesia Quick Evaluation

## 2013-08-10 NOTE — Discharge Instructions (Signed)
Transurethral Resection of the Prostate ° °Care After ° °Refer to this sheet in the next few weeks. These discharge instructions provide you with general information on caring for yourself after you leave the hospital. Your caregiver may also give you specific instructions. Your treatment has been planned according to the most current medical practices available, but unavoidable complications sometimes occur. If you have any problems or questions after discharge, please call your caregiver. ° °HOME CARE INSTRUCTIONS  ° °Medications °· You may receive medicine for pain management. As your level of discomfort decreases, adjustments in your pain medicines may be made.  °· Take all medicines as directed.  °· You may be given a medicine (antibiotic) to kill germs following surgery. Finish all medicines. Let your caregiver know if you have any side effects or problems from the medicine.  °· If you are on aspirin, it would be best not to restart the aspirin until the blood in the urine clears °Hygiene °· You can take a shower after surgery.  °· You should not take a bath while you still have the urethral catheter. °Activity °· You will be encouraged to get out of bed as much as possible and increase your activity level as tolerated.  °· Spend the first week in and around your home. For 3 weeks, avoid the following:  °· Straining.  °· Running.  °· Strenuous work.  °· Walks longer than a few blocks.  °· Riding for extended periods.  °· Sexual relations.  °· Do not lift heavy objects (more than 20 pounds) for at least 1 month. When lifting, use your arms instead of your abdominal muscles.  °· You will be encouraged to walk as tolerated. Do not exert yourself. Increase your activity level slowly. Remember that it is important to keep moving after an operation of any type. This cuts down on the possibility of developing blood clots.  °· Your caregiver will tell you when you can resume driving and light housework. Discuss this  at your first office visit after discharge. °Diet °· No special diet is ordered after a TURP. However, if you are on a special diet for another medical problem, it should be continued.  °· Normal fluid intake is usually recommended.  °· Avoid alcohol and caffeinated drinks for 2 weeks. They irritate the bladder. Decaffeinated drinks are okay.  °· Avoid spicy foods.  °Bladder Function °· For the first 10 days, empty the bladder whenever you feel a definite desire. Do not try to hold the urine for long periods of time.  °· Urinating once or twice a night even after you are healed is not uncommon.  °· You may see some recurrence of blood in the urine after discharge from the hospital. This usually happens within 2 weeks after the procedure.If this occurs, force fluids again as you did in the hospital and reduce your activity.  °Bowel Function °· You may experience some constipation after surgery. This can be minimized by increasing fluids and fiber in your diet. Drink enough water and fluids to keep your urine clear or pale yellow.  °· A stool softener may be prescribed for use at home. Do not strain to move your bowels.  °· If you are requiring increased pain medicine, it is important that you take stool softeners to prevent constipation. This will help to promote proper healing by reducing the need to strain to move your bowels.  °Sexual Activity °· Semen movement in the opposite direction and into the bladder (  retrograde ejaculation) may occur. Since the semen passes into the bladder, cloudy urine can occur the first time you urinate after intercourse. Or, you may not have an ejaculation during erection. Ask your caregiver when you can resume sexual activity. Retrograde ejaculation and reduced semen discharge should not reduce one's pleasure of intercourse.  °Postoperative Visit °· Arrange the date and time of your after surgery visit with your caregiver.  °Return to Work °· After your recovery is complete, you will  be able to return to work and resume all activities. Your caregiver will inform you when you can return to work.  °Foley Catheter Care °A soft, flexible tube (Foley catheter) may have been placed in your bladder to drain urine and fluid. Follow these instructions: °Taking Care of the Catheter °· Keep the area where the catheter leaves your body clean.  °· Attach the catheter to the leg so there is no tension on the catheter.  °· Keep the drainage bag below the level of the bladder, but keep it OFF the floor.  °· Do not take long soaking baths. Your caregiver will give instructions about showering.  °· Wash your hands before touching ANYTHING related to the catheter or bag.  °· Using mild soap and warm water on a washcloth:  °· Clean the area closest to the catheter insertion site using a circular motion around the catheter.  °· Clean the catheter itself by wiping AWAY from the insertion site for several inches down the tube.  °· NEVER wipe upward as this could sweep bacteria up into the urethra (tube in your body that normally drains the bladder) and cause infection.  °· Place a small amount of sterile lubricant at the tip of the penis where the catheter is entering.  °Taking Care of the Drainage Bags °· Two drainage bags may be taken home: a large overnight drainage bag, and a smaller leg bag which fits underneath clothing.  °· It is okay to wear the overnight bag at any time, but NEVER wear the smaller leg bag at night.  °· Keep the drainage bag well below the level of your bladder. This prevents backflow of urine into the bladder and allows the urine to drain freely.  °· Anchor the tubing to your leg to prevent pulling or tension on the catheter. Use tape or a leg strap provided by the hospital.  °· Empty the drainage bag when it is 1/2 to 3/4 full. Wash your hands before and after touching the bag.  °· Periodically check the tubing for kinks to make sure there is no pressure on the tubing which could restrict  the flow of urine.  °Changing the Drainage Bags °· Cleanse both ends of the clean bag with alcohol before changing.  °· Pinch off the rubber catheter to avoid urine spillage during the disconnection.  °· Disconnect the dirty bag and connect the clean one.  °· Empty the dirty bag carefully to avoid a urine spill.  °· Attach the new bag to the leg with tape or a leg strap.  °Cleaning the Drainage Bags °· Whenever a drainage bag is disconnected, it must be cleaned quickly so it is ready for the next use.  °· Wash the bag in warm, soapy water.  °· Rinse the bag thoroughly with warm water.  °· Soak the bag for 30 minutes in a solution of white vinegar and water (1 cup vinegar to 1 quart warm water).  °· Rinse with warm water.  °SEEK MEDICAL   CARE IF:  °· You have chills or night sweats.  °· You are leaking around your catheter or have problems with your catheter. It is not uncommon to have sporadic leakage around your catheter as a result of bladder spasms. If the leakage stops, there is not much need for concern. If you are uncertain, call your caregiver.  °· You develop side effects that you think are coming from your medicines.  °SEEK IMMEDIATE MEDICAL CARE IF:  °· You are suddenly unable to urinate. Check to see if there are any kinks in the drainage tubing that may cause this. If you cannot find any kinks, call your caregiver immediately. This is an emergency.  °· You develop shortness of breath or chest pains.  °· Bleeding persists or clots develop in your urine.  °· You have a fever.  °· You develop pain in your back or over your lower belly (abdomen).  °· You develop pain or swelling in your legs.  °· Any problems you are having get worse rather than better.  °MAKE SURE YOU:  °· Understand these instructions.  °· Will watch your condition.  °· Will get help right away if you are not doing well or get worse.  °Document Released: 04/06/2005 Document Revised: 12/17/2010 Document Reviewed: 11/28/2008 °[employer]®  Patient Information ©2012 [employer].Transurethral Resection of the Prostate °Care After °Refer to this sheet in the next few weeks. These discharge instructions provide you with general information on caring for yourself after you leave the hospital. Your caregiver may also give you specific instructions. Your treatment has been planned according to the most current medical practices available, but unavoidable complications sometimes occur. If you have any problems or questions after discharge, please call your caregiver. °

## 2013-08-10 NOTE — Anesthesia Postprocedure Evaluation (Signed)
  Anesthesia Post-op Note  Patient: [name]  Procedure(s) Performed: Procedure(s) (LRB): TRANSURETHRAL RESECTION OF THE PROSTATE WITH GYRUS INSTRUMENTS (N/A) CYSTOSCOPY WITH LITHOLAPAXY WITH STONEBREAKER (N/A)  Patient Location: PACU  Anesthesia Type: General  Level of Consciousness: awake and alert   Airway and Oxygen Therapy: Patient Spontanous Breathing  Post-op Pain: mild  Post-op Assessment: Post-op Vital signs reviewed, Patient's Cardiovascular Status Stable, Respiratory Function Stable, Patent Airway and No signs of Nausea or vomiting  Last Vitals:  Filed Vitals:   08/10/13 1228  BP: 153/76  Pulse: 77  Temp: 36.4 C  Resp: 20    Post-op Vital Signs: stable   Complications: No apparent anesthesia complications

## 2013-08-11 ENCOUNTER — Encounter (HOSPITAL_COMMUNITY): Payer: Self-pay | Admitting: Urology

## 2013-08-11 MED ORDER — PHENOL 1.4 % MT LIQD: 1.0000 | OROMUCOSAL | Status: DC | PRN

## 2013-08-11 MED ORDER — ZOLPIDEM TARTRATE 5 MG PO TABS
5.0000 mg | ORAL_TABLET | Freq: Every evening | ORAL | Status: DC | PRN
  Administered 2013-08-11: 5 mg via ORAL
  Filled 2013-08-11: qty 1

## 2013-08-11 MED ORDER — TRIMETHOPRIM 100 MG PO TABS: 100.0000 mg | ORAL_TABLET | Freq: Two times a day (BID) | ORAL | Status: DC

## 2013-08-11 MED ORDER — MENTHOL 3 MG MT LOZG: 1.0000 | LOZENGE | OROMUCOSAL | Status: DC | PRN

## 2013-08-11 NOTE — Progress Notes (Signed)
Patient had a 18" french coude catheter inserted by RN. I watched the procedure. The patient tolerated the procedure well and experienced immediate relief. The patient felt much better and ordered lunch. Lacretia Leigh, SN, RCC

## 2013-08-11 NOTE — Progress Notes (Signed)
1 Day Post-Op Subjective: Patient reports no discomfort. His catheter came out earlier this morning. He has not voided  yet-rior to catheter removal, he had clear irrigant   Objective: Vital signs in last 24 hours: Temp:  [97.4 F (36.3 C)-98.3 F (36.8 C)] 98.3 F (36.8 C) (04/24 0611) Pulse Rate:  [74-91] 76 (04/24 0611) Resp:  [16-20] 18 (04/24 0611) BP: (112-156)/(62-78) 118/73 mmHg (04/24 0611) SpO2:  [95 %-100 %] 95 % (04/24 0611) Weight:  [84.369 kg (186 lb)] 84.369 kg (186 lb) (04/23 1228)  Intake/Output from previous day: 04/23 0701 - 04/24 0700 In: 40981 [P.O.:480; I.V.:1200] Out: 26050 [Urine:26050] Intake/Output this shift:    Physical Exam:  Constitutional: Vital signs reviewed. WD WN in NAD   Eyes: PERRL, No scleral icterus.      Lab Results: No results found for this basename: HGB, HCT,  in the last 72 hours BMET No results found for this basename: NA, K, CL, CO2, GLUCOSE, BUN, CREATININE, CALCIUM,  in the last 72 hours  Recent Labs  08/10/13 0850  INR 1.09   No results found for this basename: LABURIN,  in the last 72 hours No results found for this or any previous visit.  Studies/Results: No results found.  Assessment/Plan:    Postoperative day #1 TURP, cystolitholapaxy. His catheter has been removed. He has not voided yet    I will BE up later this morning. Hopefully, he will void adequately. If not, we will place a catheter and perhaps let him go home.   LOS: 1 day   [name] 08/11/2013, 7:47 AM

## 2013-08-11 NOTE — Progress Notes (Signed)
Upon assessment, the patient was waiting for his family to return to eat lunch with them. The patient is draining bloody urine through his catheter at this time. Bladder and penis pain have been alleviated. Patients lungs were clear bilaterally with no adventitious breath sounds. His skin was moist, but flaky in areas and intact. The patients hair was evenly distributed and clean. The patient is eating lunch and continues to drink plenty of fluids. Lacretia Leigh, SN, RCC

## 2013-08-11 NOTE — Progress Notes (Signed)
The patient received all medication on the scheduled MAR for 1000, PO and tolerated all medications well. Patient also received Hydrocodone for pain. The patient has been complaining of pain in the lower abdomen and penis. On a scale of 1-10, he rated his pain a 6. The patient received a bladder scan, which showed a bladder volume of 563, therefore the patient will receive a catheter to assist in draining excess bladder volume. The patient is apprehensive about receiving a catheter due to anticipated pain associated with catheter insertion. Patients vitals show an elevated blood pressure, patient recently received bp medications. Lacretia Leigh, SN, RCC  Filed Vitals:   08/11/13 1030  BP: 180/94  Pulse: 104  Temp: 98.3 F (36.8 C)  Resp: 20

## 2013-08-11 NOTE — Progress Notes (Signed)
I have reviewed all documentation from student nurse. 

## 2013-08-14 ENCOUNTER — Emergency Department (HOSPITAL_COMMUNITY)
Admission: EM | Admit: 2013-08-14 | Discharge: 2013-08-14 | Disposition: A | Discharge status: Home / Self Care | Payer: Medicare Other | Attending: Emergency Medicine | Admitting: Emergency Medicine

## 2013-08-14 ENCOUNTER — Encounter (HOSPITAL_COMMUNITY): Payer: Self-pay | Admitting: Emergency Medicine

## 2013-08-14 ENCOUNTER — Ambulatory Visit (INDEPENDENT_AMBULATORY_CARE_PROVIDER_SITE_OTHER): Payer: Medicare Other | Admitting: *Deleted

## 2013-08-14 DIAGNOSIS — Z79899 Other long term (current) drug therapy: Secondary | ICD-10-CM | POA: Insufficient documentation

## 2013-08-14 DIAGNOSIS — Z8669 Personal history of other diseases of the nervous system and sense organs: Secondary | ICD-10-CM | POA: Diagnosis not present

## 2013-08-14 DIAGNOSIS — Z7901 Long term (current) use of anticoagulants: Secondary | ICD-10-CM

## 2013-08-14 DIAGNOSIS — E119 Type 2 diabetes mellitus without complications: Secondary | ICD-10-CM | POA: Insufficient documentation

## 2013-08-14 DIAGNOSIS — Z8571 Personal history of Hodgkin lymphoma: Secondary | ICD-10-CM | POA: Insufficient documentation

## 2013-08-14 DIAGNOSIS — N489 Disorder of penis, unspecified: Secondary | ICD-10-CM | POA: Insufficient documentation

## 2013-08-14 DIAGNOSIS — I4891 Unspecified atrial fibrillation: Secondary | ICD-10-CM | POA: Diagnosis not present

## 2013-08-14 DIAGNOSIS — I10 Essential (primary) hypertension: Secondary | ICD-10-CM | POA: Diagnosis not present

## 2013-08-14 DIAGNOSIS — Z8589 Personal history of malignant neoplasm of other organs and systems: Secondary | ICD-10-CM | POA: Insufficient documentation

## 2013-08-14 DIAGNOSIS — Z5181 Encounter for therapeutic drug level monitoring: Secondary | ICD-10-CM | POA: Diagnosis not present

## 2013-08-14 DIAGNOSIS — R319 Hematuria, unspecified: Secondary | ICD-10-CM | POA: Diagnosis not present

## 2013-08-14 LAB — POCT INR: INR: 1

## 2013-08-14 NOTE — ED Provider Notes (Signed)
CSN: [id number]     Arrival date & time 08/14/13  1812 History  This chart was scribed for [name] Diego, MD by Marcha Dutton, ED Scribe. This patient was seen in room APA08/APA08 and the patient's care was started at 10:13 PM.    Chief Complaint  Patient presents with  . Foley Catheter problem       The history is provided by the patient and a relative. No language interpreter was used.  HPI Comments: [name] is a 80 y.o. male who presents to the Emergency Department complaining of penile pain as a result of a his Foley Catheter not draining that began yesterday. He states the catheter feels like it's backing up and feeling like he was going to "burst." Upon arrival, pt's bladder is empty and the catheter appears to be draining. Pt reports associated hematuria. He states that when the bag drains his pain is relieved. Pt states he has an appointment with Dr. Diona Fanti, his urologist, at 10:30 AM. He also reports he will have surgery on Thursday.   Past Medical History  Diagnosis Date  . Hypertension   . Atrial fibrillation     Dr. Harl Bowie- [facility] follows saw 11'14  . 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 Margot Chimes, thus far without recurrence.   . NHL (non-Hodgkin's lymphoma) 08/18/2011    Diffuse large B-cell lymphoma, clinically stage IIIA, CD20 positive, status post cervical lymph node biopsy 08/03/2003 on the left. PET scan was also positive in the spleen and small bowel region, but bone marrow aspiration and biopsy were negative. So he essentially had stage IIIAs. He received R-CHOP x6 cycles with CR established by PET scan criteria on 11/23/2003 with no evidence for relapse th  . Cataracts, both eyes     surgery planned May 2015  .  Bladder stones     tx. with oral meds and antibiotics, now surgery planned  . Diabetes mellitus without complication    Past Surgical History  Procedure Laterality Date  . Cholecystectomy    . Incision and drainage perirectal abscess    . Portacath placement      insertion and removal -last chemotherapy 10 yrs ago  . Lymph node dissection Left     '05  . Port-a-cath removal    . Transurethral resection of prostate N/A 08/10/2013    Procedure: TRANSURETHRAL RESECTION OF THE PROSTATE WITH GYRUS INSTRUMENTS;  Surgeon: [name], MD;  Location: [facility];  Service: Urology;  Laterality: N/A;  . Cystoscopy with litholapaxy N/A 08/10/2013    Procedure: CYSTOSCOPY WITH LITHOLAPAXY WITH [name];  Surgeon: [name], MD;  Location: [facility];  Service: Urology;  Laterality: N/A;   No family history on file. History  Substance Use Topics  . Smoking status: Never Smoker   . Smokeless tobacco: Never Used  . Alcohol Use: No    Review of Systems  Constitutional: Negative for appetite change and fatigue.  HENT: Negative for congestion, ear discharge and sinus pressure.   Eyes: Negative for discharge.  Respiratory: Negative for cough.   Cardiovascular: Negative for chest pain.  Gastrointestinal: Negative for abdominal pain and diarrhea.  Genitourinary: Positive for hematuria and penile pain. Negative for frequency.  Musculoskeletal: Negative for back pain.  Skin: Negative for rash.  Neurological:  Negative for seizures and headaches.  Psychiatric/Behavioral: Negative for hallucinations.      Allergies  Review of patient's allergies indicates no known allergies.  Home Medications   Prior to Admission medications   Medication Sig Start Date End Date Taking? Authorizing Provider  amitriptyline (ELAVIL) 25 MG tablet Take 25 mg by mouth at bedtime.   Yes Historical Provider, MD  diltiazem (DILACOR XR) 180 MG 24 hr capsule Take 180 mg by mouth every evening.   Yes Historical  Provider, MD  Docusate Calcium (STOOL SOFTENER PO) Take 1 tablet by mouth daily.   Yes Historical Provider, MD  enoxaparin (LOVENOX) 120 MG/0.8ML injection Inject 120 mg into the skin daily. X 4 doses 08/06/13  Yes Historical Provider, MD  glipiZIDE (GLUCOTROL) 5 MG tablet Take 5 mg by mouth daily before breakfast.    Yes Historical Provider, MD  linagliptin (TRADJENTA) 5 MG TABS tablet Take 5 mg by mouth at bedtime.    Yes Historical Provider, MD  lisinopril-hydrochlorothiazide (PRINZIDE,ZESTORETIC) 20-12.5 MG per tablet Take 1 tablet by mouth every morning.   Yes Historical Provider, MD  omeprazole (PRILOSEC) 20 MG capsule Take 20 mg by mouth daily.    Yes Historical Provider, MD  potassium chloride (K-DUR,KLOR-CON) 10 MEQ tablet Take 10 mEq by mouth daily. 09/20/12  Yes Lendon Colonel, NP  trimethoprim (TRIMPEX) 100 MG tablet Take 1 tablet (100 mg total) by mouth 2 (two) times daily. 08/11/13  Yes [name], MD  acetaminophen (TYLENOL) 500 MG tablet Take 500 mg by mouth every 6 (six) hours as needed for mild pain.     Historical Provider, MD  Cranberry 200 MG CAPS Take 2 capsules by mouth daily.     Historical Provider, MD   Triage Vitals: BP 131/80  Pulse 93  Temp(Src) 98.2 F (36.8 C) (Oral)  Resp 16  Ht 5' 10.5" (1.791 m)  Wt 186 lb (84.369 kg)  BMI 26.30 kg/m2  SpO2 97%  Physical Exam  Nursing note and vitals reviewed. Constitutional: He is oriented to person, place, and time. He appears well-developed.  HENT:  Head: Normocephalic.  Eyes: Conjunctivae are normal.  Neck: No tracheal deviation present.  Cardiovascular:  No murmur heard. Abdominal: There is no tenderness.  Genitourinary:  Foley catheter in full of blood and clots  Musculoskeletal: Normal range of motion.  Neurological: He is oriented to person, place, and time.  Skin: Skin is warm.  Psychiatric: He has a normal mood and affect.    ED Course  Procedures (including critical care time)  DIAGNOSTIC  STUDIES: Oxygen Saturation is 97% on RA, adequate by my interpretation.    COORDINATION OF CARE: 10:19 PM- Pt advised of plan for treatment and pt agrees.  Labs Review Labs Reviewed - No data to display  Imaging Review No results found.   EKG Interpretation None     Foley irrigated and is draining properly  MDM   Final diagnoses:  None  The chart was scribed for me under my direct supervision.  I personally performed the history, physical, and medical decision making and all procedures in the evaluation of this patient.[name] Diego, MD 08/14/13 [id number]

## 2013-08-14 NOTE — Discharge Instructions (Signed)
Follow up with your md as planned °

## 2013-08-14 NOTE — ED Notes (Signed)
Pt states he had TURP on Thursday and had catheter placed. Pt seen by urologist on Friday and was unable to urinate post catheter removal and had 2nd (current) indwelling catheter placed. Pt c/o feeling like the catheter is "backing up then feeling a burst and leaking around catheter". Bloody urine noted in tubing and bag in triage.

## 2013-08-15 ENCOUNTER — Telehealth: Payer: Self-pay | Admitting: *Deleted

## 2013-08-15 ENCOUNTER — Ambulatory Visit (INDEPENDENT_AMBULATORY_CARE_PROVIDER_SITE_OTHER): Payer: Medicare Other | Admitting: Urology

## 2013-08-15 DIAGNOSIS — N210 Calculus in bladder: Secondary | ICD-10-CM | POA: Diagnosis not present

## 2013-08-15 DIAGNOSIS — N400 Enlarged prostate without lower urinary tract symptoms: Secondary | ICD-10-CM | POA: Diagnosis not present

## 2013-08-15 DIAGNOSIS — C61 Malignant neoplasm of prostate: Secondary | ICD-10-CM

## 2013-08-15 NOTE — Telephone Encounter (Signed)
Pt called back.  He saw Dr Eulogio Ditch this morning.  Foley catheter was removed and pt has been able to void x 3 since removal.  Per pt. Dr Eulogio Ditch to pt to stay off coumadin and lovenox until urine is yellow and not bloody.  He will f/u with with Dr D in 1 week and let me know when he is cleared to resume anticoagulation.

## 2013-08-15 NOTE — Telephone Encounter (Signed)
Please call patient regarding Lovenox / tgs

## 2013-08-17 NOTE — Discharge Summary (Signed)
Patient ID: [name] MRN: [id number] DOB/AGE: 1923/09/07 80 y.o.  Admit date: 08/10/2013 Discharge date: 08/17/2013  Primary Care Physician:  Rosita Fire, MD  Discharge Diagnoses:  Adenocarcinoma prostate Present on Admission:  . Hypertrophy of prostate with urinary obstruction and other lower urinary tract symptoms (LUTS)  Consults:  None   Discharge Medications:   Medication List    STOP taking these medications       finasteride 5 MG tablet  Commonly known as:  PROSCAR     warfarin 2.5 MG tablet  Commonly known as:  COUMADIN      TAKE these medications       acetaminophen 500 MG tablet  Commonly known as:  TYLENOL  Take 500 mg by mouth every 6 (six) hours as needed for mild pain.     amitriptyline 25 MG tablet  Commonly known as:  ELAVIL  Take 25 mg by mouth at bedtime.     Cranberry 200 MG Caps  Take 2 capsules by mouth daily.     diltiazem 180 MG 24 hr capsule  Commonly known as:  DILACOR XR  Take 180 mg by mouth every evening.     enoxaparin 120 MG/0.8ML injection  Commonly known as:  LOVENOX  Inject 120 mg into the skin daily. X 4 doses     glipiZIDE 5 MG tablet  Commonly known as:  GLUCOTROL  Take 5 mg by mouth daily before breakfast.     linagliptin 5 MG Tabs tablet  Commonly known as:  TRADJENTA  Take 5 mg by mouth at bedtime.     lisinopril-hydrochlorothiazide 20-12.5 MG per tablet  Commonly known as:  PRINZIDE,ZESTORETIC  Take 1 tablet by mouth every morning.     omeprazole 20 MG capsule  Commonly known as:  PRILOSEC  Take 20 mg by mouth daily.     potassium chloride 10 MEQ tablet  Commonly known as:  K-DUR,KLOR-CON  Take 10 mEq by mouth daily.     STOOL SOFTENER PO  Take 1 tablet by mouth daily.     trimethoprim 100 MG tablet  Commonly known as:  TRIMPEX  Take 1 tablet (100 mg total) by mouth 2 (two) times daily.         Significant Diagnostic Studies:  No results found.  Brief H and P: For complete details please  refer to admission H and P, but in brief the patient is an 80 year old male with symptomatic bladder outlet obstruction and a bladder stone. He presents at this time for endoscopic management of both of these.  Hospital Course: The patient was admitted following his TURP and cystolitholapaxy. He tolerated the procedure well. The irrigation was tapered, and his catheter was removed on postoperative morning #1. He was not able to void adequately. He subsequently underwent catheter replacement. He was discharged home Active Problems:   Hypertrophy of prostate with urinary obstruction and other lower urinary tract symptoms (LUTS)   Day of Discharge BP 180/94  Pulse 104  Temp(Src) 98.3 F (36.8 C) (Oral)  Resp 20  Ht 5\' 10"  (1.778 m)  Wt 84.369 kg (186 lb)  BMI 26.69 kg/m2  SpO2 98%  No results found for this or any previous visit (from the past 24 hour(s)).  Physical Exam: General: Alert and awake oriented x3 not in any acute distress. HEENT: anicteric sclera, pupils reactive to light and accommodation CVS: S1-S2 clear no murmur rubs or gallops Chest: clear to auscultation bilaterally, no wheezing rales or rhonchi Abdomen: soft nontender, nondistended,  normal bowel sounds, no organomegaly Extremities: no cyanosis, clubbing or edema noted bilaterally Neuro: Cranial nerves II-XII intact, no focal neurological deficits  Disposition:  Home  Diet:  No restrictions  Activity:  Restrictions discussed with family   Disposition and Follow-up:     Discharge Orders   Future Appointments Provider Department Dept Phone   11/06/2013 9:00 AM [facility] [id number]   11/09/2013 9:30 AM [facility] Provider [place] [phone]   Future Orders Complete By Expires   Discharge patient  As directed          TESTS THAT NEED FOLLOW-UP  Pathology review  DISCHARGE FOLLOW-UP Follow-up Information   Follow up with Jorja Loa, MD.  (Tuesday in [place]-we'll call to schedule)    Specialty:  Urology   Contact information:   [facility]  [place] [address] [phone]       Time spent on Discharge:  15 minutes  Signed: [name] 08/17/2013, 6:18 AM

## 2013-08-18 ENCOUNTER — Other Ambulatory Visit (HOSPITAL_COMMUNITY): Payer: Self-pay | Admitting: *Deleted

## 2013-08-18 DIAGNOSIS — C8590 Non-Hodgkin lymphoma, unspecified, unspecified site: Secondary | ICD-10-CM

## 2013-08-22 ENCOUNTER — Ambulatory Visit (INDEPENDENT_AMBULATORY_CARE_PROVIDER_SITE_OTHER): Payer: Medicare Other | Admitting: Urology

## 2013-08-22 DIAGNOSIS — C61 Malignant neoplasm of prostate: Secondary | ICD-10-CM | POA: Diagnosis not present

## 2013-08-25 ENCOUNTER — Other Ambulatory Visit: Payer: Self-pay | Admitting: Urology

## 2013-08-25 DIAGNOSIS — C61 Malignant neoplasm of prostate: Secondary | ICD-10-CM

## 2013-08-29 DIAGNOSIS — H2589 Other age-related cataract: Secondary | ICD-10-CM | POA: Diagnosis not present

## 2013-08-30 ENCOUNTER — Encounter (HOSPITAL_COMMUNITY): Payer: Self-pay

## 2013-08-30 ENCOUNTER — Encounter (HOSPITAL_COMMUNITY)
Admission: RE | Admit: 2013-08-30 | Discharge: 2013-08-30 | Disposition: A | Discharge status: Home / Self Care | Payer: Medicare Other | Source: Ambulatory Visit | Attending: Urology | Admitting: Urology

## 2013-08-30 DIAGNOSIS — C61 Malignant neoplasm of prostate: Secondary | ICD-10-CM | POA: Diagnosis not present

## 2013-08-30 MED ORDER — TECHNETIUM TC 99M MEDRONATE IV KIT
25.0000 | PACK | Freq: Once | INTRAVENOUS | Status: AC | PRN
  Administered 2013-08-30: 25 via INTRAVENOUS

## 2013-08-31 ENCOUNTER — Ambulatory Visit (HOSPITAL_COMMUNITY)
Admission: RE | Admit: 2013-08-31 | Discharge: 2013-08-31 | Disposition: A | Discharge status: Home / Self Care | Payer: Medicare Other | Source: Ambulatory Visit | Attending: Urology | Admitting: Urology

## 2013-08-31 ENCOUNTER — Ambulatory Visit (INDEPENDENT_AMBULATORY_CARE_PROVIDER_SITE_OTHER): Payer: Medicare Other | Admitting: *Deleted

## 2013-08-31 DIAGNOSIS — I4891 Unspecified atrial fibrillation: Secondary | ICD-10-CM | POA: Diagnosis not present

## 2013-08-31 DIAGNOSIS — N210 Calculus in bladder: Secondary | ICD-10-CM | POA: Insufficient documentation

## 2013-08-31 DIAGNOSIS — N201 Calculus of ureter: Secondary | ICD-10-CM | POA: Diagnosis not present

## 2013-08-31 DIAGNOSIS — N3289 Other specified disorders of bladder: Secondary | ICD-10-CM | POA: Diagnosis not present

## 2013-08-31 DIAGNOSIS — Z5181 Encounter for therapeutic drug level monitoring: Secondary | ICD-10-CM | POA: Diagnosis not present

## 2013-08-31 DIAGNOSIS — C61 Malignant neoplasm of prostate: Secondary | ICD-10-CM

## 2013-08-31 DIAGNOSIS — Z7901 Long term (current) use of anticoagulants: Secondary | ICD-10-CM | POA: Diagnosis not present

## 2013-08-31 DIAGNOSIS — M948X9 Other specified disorders of cartilage, unspecified sites: Secondary | ICD-10-CM | POA: Insufficient documentation

## 2013-08-31 LAB — POCT I-STAT CREATININE: Creatinine, Ser: 1.1 mg/dL (ref 0.50–1.35)

## 2013-08-31 LAB — POCT INR: INR: 1.3

## 2013-08-31 MED ORDER — IOHEXOL 300 MG/ML  SOLN
100.0000 mL | Freq: Once | INTRAMUSCULAR | Status: AC | PRN
  Administered 2013-08-31: 100 mL via INTRAVENOUS

## 2013-08-31 MED ORDER — WARFARIN SODIUM 2.5 MG PO TABS: ORAL_TABLET | ORAL | Status: DC

## 2013-08-31 MED ORDER — SODIUM CHLORIDE 0.9 % IJ SOLN
INTRAMUSCULAR | Status: AC
  Filled 2013-08-31: qty 500

## 2013-09-04 ENCOUNTER — Ambulatory Visit (INDEPENDENT_AMBULATORY_CARE_PROVIDER_SITE_OTHER): Payer: Medicare Other | Admitting: *Deleted

## 2013-09-04 DIAGNOSIS — Z5181 Encounter for therapeutic drug level monitoring: Secondary | ICD-10-CM

## 2013-09-04 DIAGNOSIS — Z7901 Long term (current) use of anticoagulants: Secondary | ICD-10-CM

## 2013-09-04 DIAGNOSIS — I4891 Unspecified atrial fibrillation: Secondary | ICD-10-CM

## 2013-09-04 LAB — POCT INR: INR: 1.9

## 2013-09-05 ENCOUNTER — Encounter (HOSPITAL_COMMUNITY): Payer: Medicare Other | Attending: Hematology and Oncology

## 2013-09-05 DIAGNOSIS — C7952 Secondary malignant neoplasm of bone marrow: Secondary | ICD-10-CM | POA: Diagnosis not present

## 2013-09-05 DIAGNOSIS — C7951 Secondary malignant neoplasm of bone: Secondary | ICD-10-CM | POA: Diagnosis not present

## 2013-09-05 DIAGNOSIS — C8590 Non-Hodgkin lymphoma, unspecified, unspecified site: Secondary | ICD-10-CM

## 2013-09-05 DIAGNOSIS — C8589 Other specified types of non-Hodgkin lymphoma, extranodal and solid organ sites: Secondary | ICD-10-CM

## 2013-09-05 DIAGNOSIS — E1149 Type 2 diabetes mellitus with other diabetic neurological complication: Secondary | ICD-10-CM | POA: Diagnosis not present

## 2013-09-05 DIAGNOSIS — C61 Malignant neoplasm of prostate: Secondary | ICD-10-CM | POA: Insufficient documentation

## 2013-09-05 DIAGNOSIS — B351 Tinea unguium: Secondary | ICD-10-CM | POA: Diagnosis not present

## 2013-09-07 LAB — BETA 2 MICROGLOBULIN, SERUM: Beta-2 Microglobulin: 3.3 mg/L — ABNORMAL HIGH (ref <=2.51)

## 2013-09-07 NOTE — Progress Notes (Signed)
Labs drawn

## 2013-09-08 ENCOUNTER — Encounter (HOSPITAL_BASED_OUTPATIENT_CLINIC_OR_DEPARTMENT_OTHER): Payer: Medicare Other

## 2013-09-08 ENCOUNTER — Encounter (HOSPITAL_COMMUNITY): Payer: Self-pay

## 2013-09-08 ENCOUNTER — Encounter (HOSPITAL_COMMUNITY): Payer: Medicare Other

## 2013-09-08 VITALS — BP 153/84 | HR 67 | Temp 97.2°F | Resp 20 | Wt 186.2 lb

## 2013-09-08 DIAGNOSIS — C8590 Non-Hodgkin lymphoma, unspecified, unspecified site: Secondary | ICD-10-CM

## 2013-09-08 DIAGNOSIS — C494 Malignant neoplasm of connective and soft tissue of abdomen: Secondary | ICD-10-CM | POA: Diagnosis not present

## 2013-09-08 DIAGNOSIS — C7982 Secondary malignant neoplasm of genital organs: Secondary | ICD-10-CM

## 2013-09-08 DIAGNOSIS — C8589 Other specified types of non-Hodgkin lymphoma, extranodal and solid organ sites: Secondary | ICD-10-CM | POA: Diagnosis not present

## 2013-09-08 DIAGNOSIS — C49A0 Gastrointestinal stromal tumor, unspecified site: Secondary | ICD-10-CM

## 2013-09-08 DIAGNOSIS — I4891 Unspecified atrial fibrillation: Secondary | ICD-10-CM

## 2013-09-08 DIAGNOSIS — C7951 Secondary malignant neoplasm of bone: Secondary | ICD-10-CM | POA: Diagnosis not present

## 2013-09-08 DIAGNOSIS — C7952 Secondary malignant neoplasm of bone marrow: Secondary | ICD-10-CM

## 2013-09-08 DIAGNOSIS — C44319 Basal cell carcinoma of skin of other parts of face: Secondary | ICD-10-CM | POA: Diagnosis not present

## 2013-09-08 DIAGNOSIS — C61 Malignant neoplasm of prostate: Secondary | ICD-10-CM | POA: Diagnosis not present

## 2013-09-08 HISTORY — DX: Secondary malignant neoplasm of genital organs: C79.82

## 2013-09-08 LAB — CBC WITH DIFFERENTIAL/PLATELET
Basophils Absolute: 0.1 10*3/uL (ref 0.0–0.1)
Basophils Relative: 1 % (ref 0–1)
Eosinophils Absolute: 0.2 10*3/uL (ref 0.0–0.7)
Eosinophils Relative: 4 % (ref 0–5)
HCT: 42.2 % (ref 39.0–52.0)
Hemoglobin: 14.5 g/dL (ref 13.0–17.0)
Lymphocytes Relative: 32 % (ref 12–46)
Lymphs Abs: 1.9 10*3/uL (ref 0.7–4.0)
MCH: 30.4 pg (ref 26.0–34.0)
MCHC: 34.4 g/dL (ref 30.0–36.0)
MCV: 88.5 fL (ref 78.0–100.0)
Monocytes Absolute: 0.5 10*3/uL (ref 0.1–1.0)
Monocytes Relative: 8 % (ref 3–12)
Neutro Abs: 3.3 10*3/uL (ref 1.7–7.7)
Neutrophils Relative %: 55 % (ref 43–77)
Platelets: 205 10*3/uL (ref 150–400)
RBC: 4.77 MIL/uL (ref 4.22–5.81)
RDW: 14.3 % (ref 11.5–15.5)
WBC: 5.9 10*3/uL (ref 4.0–10.5)

## 2013-09-08 LAB — COMPREHENSIVE METABOLIC PANEL
ALT: 20 U/L (ref 0–53)
AST: 19 U/L (ref 0–37)
Albumin: 3.8 g/dL (ref 3.5–5.2)
Alkaline Phosphatase: 96 U/L (ref 39–117)
BUN: 17 mg/dL (ref 6–23)
CO2: 30 mEq/L (ref 19–32)
Calcium: 10 mg/dL (ref 8.4–10.5)
Chloride: 101 mEq/L (ref 96–112)
Creatinine, Ser: 0.87 mg/dL (ref 0.50–1.35)
GFR calc Af Amer: 87 mL/min — ABNORMAL LOW (ref >90)
GFR calc non Af Amer: 75 mL/min — ABNORMAL LOW (ref >90)
Glucose, Bld: 142 mg/dL — ABNORMAL HIGH (ref 70–99)
Potassium: 4.3 mEq/L (ref 3.7–5.3)
Sodium: 141 mEq/L (ref 137–147)
Total Bilirubin: 0.6 mg/dL (ref 0.3–1.2)
Total Protein: 7.6 g/dL (ref 6.0–8.3)

## 2013-09-08 LAB — RETICULOCYTES
RBC.: 4.77 MIL/uL (ref 4.22–5.81)
Retic Count, Absolute: 100.2 10*3/uL (ref 19.0–186.0)
Retic Ct Pct: 2.1 % (ref 0.4–3.1)

## 2013-09-08 MED ORDER — BICALUTAMIDE 50 MG PO TABS: 50.0000 mg | ORAL_TABLET | Freq: Every day | ORAL | Status: DC

## 2013-09-08 NOTE — Progress Notes (Signed)
[facility]  OFFICE PROGRESS Jolee Ewing, MD [address] [place] [address]  DIAGNOSIS: NHL (non-Hodgkin's lymphoma) - Plan: CBC with Differential, Reticulocytes, Comprehensive metabolic panel  Prostate cancer - Plan: Testosterone, PSA, Testosterone, PSA  GIST (gastrointestinal stromal tumor), malignant  Bone metastases - Plan: Testosterone, PSA, Testosterone, PSA  Chief Complaint  Patient presents with  . History of lymphoma  . Lee diagnosis prostate cancer with bone metastases    CURRENT THERAPY: Watchful expectation and surveillance.  INTERVAL HISTORY: [name] 80 y.o. male returns for followup of diffuse large B cell lymphoma as well as GI ST, status post resection in October 2005 with lymphoma diagnosed on 08/03/2003 treated with R.-CHOP for 6 cycles with negative PET scan on 11/23/2003. He had developed urinary frequency as well as nocturia but incomplete imaging of his bladder as well and underwent TURP along with cystoscopy  nd cystolitholapoxy at which time a 3 cm stone was found along with adenocarcinoma of the prostate, Gleason score 4+3 equals 7. He subsequently underwent a CT scan of the abdomen and pelvis with no metastatic disease found. Bone scan done on 08/30/2013 showed abnormal foci of increased tracer localization in the lumbar spine, posterior lower right rib #10 and mid sternum worrisome for osseous metastases. PSA done on 08/03/2013 was 202.  His catheterization performed by days postop and when it was removed he had considerable hematuria which has now completely clear. He still has urinary incontinence particularly when he stands up. He denies any fever, night sweats, diarrhea, constipation, sore throat, bone pain, chest pain, PND, orthopnea, palpitations, cough, wheezing, lower extremity swelling or redness, headache, or seizures.  MEDICAL HISTORY: Past Medical History  Diagnosis Date  .  Hypertension   . Atrial fibrillation     Dr. Harl Bowie- [facility] follows saw 11'14  . 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 Margot Chimes, thus far without recurrence.   . NHL (non-Hodgkin's lymphoma) 08/18/2011    Diffuse large B-cell lymphoma, clinically stage IIIA, CD20 positive, status post cervical lymph node biopsy 08/03/2003 on the left. PET scan was also positive in the spleen and small bowel region, but bone marrow aspiration and biopsy were negative. So he essentially had stage IIIAs. He received R-CHOP x6 cycles with CR established by PET scan criteria on 11/23/2003 with no evidence for relapse th  . Cataracts, both eyes     surgery planned May 2015  . Bladder stones     tx. with oral meds and antibiotics, now surgery planned  . Diabetes mellitus without complication     INTERIM HISTORY: has HYPERTENSION, UNSPECIFIED; ATRIAL FIBRILLATION; Encounter for long-term (current) use of anticoagulants; NHL (non-Hodgkin's lymphoma); GIST (gastrointestinal stromal tumor), malignant; Encounter for therapeutic drug monitoring; Hypertrophy of prostate with urinary obstruction and other lower urinary tract symptoms (LUTS); and Prostate cancer on his problem list.    ALLERGIES:  has No Known Allergies.  MEDICATIONS: has a current medication list which includes the following prescription(s): acetaminophen, amitriptyline, cranberry, diltiazem, docusate calcium, glipizide, linagliptin, lisinopril-hydrochlorothiazide, omeprazole, potassium chloride, warfarin, and bicalutamide.  SURGICAL HISTORY:  Past Surgical History  Procedure Laterality Date  . Cholecystectomy    . Incision and drainage perirectal abscess    . Portacath placement  insertion and removal -last  chemotherapy 10 yrs ago  . Lymph node dissection Left     '05  . Port-a-cath removal    . Transurethral resection of prostate N/A 08/10/2013    Procedure: TRANSURETHRAL RESECTION OF THE PROSTATE WITH GYRUS INSTRUMENTS;  Surgeon: [name], MD;  Location: [facility];  Service: Urology;  Laterality: N/A;  . Cystoscopy with litholapaxy N/A 08/10/2013    Procedure: CYSTOSCOPY WITH LITHOLAPAXY WITH [name];  Surgeon: [name], MD;  Location: [facility];  Service: Urology;  Laterality: N/A;    FAMILY HISTORY: family history is not on file.  SOCIAL HISTORY:  reports that he has never smoked. He has never used smokeless tobacco. He reports that he does not drink alcohol or use illicit drugs.  REVIEW OF SYSTEMS:  Other than that discussed above is noncontributory.  PHYSICAL EXAMINATION: ECOG PERFORMANCE STATUS: 1 - Symptomatic but completely ambulatory  Blood pressure 153/84, pulse 67, temperature 97.2 F (36.2 C), temperature source Oral, resp. rate 20, weight 186 lb 3.2 oz (84.46 kg).  GENERAL:alert, no distress and comfortable SKIN: skin color, texture, turgor are normal, no rashes or significant lesions EYES: PERLA; Conjunctiva are pink and non-injected, sclera clear SINUSES: No redness or tenderness over maxillary or ethmoid sinuses OROPHARYNX:no exudate, no erythema on lips, buccal mucosa, or tongue. NECK: supple, thyroid normal size, non-tender, without nodularity. No masses CHEST: Normal AP diameter with no breast masses. No gynecomastia. LYMPH:  no palpable lymphadenopathy in the cervical, axillary or inguinal LUNGS: clear to auscultation and percussion with normal breathing effort HEART: Irregularly irregular with no S3. ABDOMEN:abdomen soft, non-tender and normal bowel sounds MUSCULOSKELETAL:no cyanosis of digits and no clubbing. Range of motion normal.  NEURO: alert & oriented x 3 with fluent speech, no focal motor/sensory deficits. Decreased hearing  acuity.   LABORATORY DATA: Office Visit on 09/08/2013  Component Date Value Ref Range Status  . WBC 09/08/2013 5.9  4.0 - 10.5 K/uL Final  . RBC 09/08/2013 4.77  4.22 - 5.81 MIL/uL Final  . Hemoglobin 09/08/2013 14.5  13.0 - 17.0 g/dL Final  . HCT 09/08/2013 42.2  39.0 - 52.0 % Final  . MCV 09/08/2013 88.5  78.0 - 100.0 fL Final  . MCH 09/08/2013 30.4  26.0 - 34.0 pg Final  . MCHC 09/08/2013 34.4  30.0 - 36.0 g/dL Final  . RDW 09/08/2013 14.3  11.5 - 15.5 % Final  . Platelets 09/08/2013 205  150 - 400 K/uL Final  . Neutrophils Relative % 09/08/2013 55  43 - 77 % Final  . Neutro Abs 09/08/2013 3.3  1.7 - 7.7 K/uL Final  . Lymphocytes Relative 09/08/2013 32  12 - 46 % Final  . Lymphs Abs 09/08/2013 1.9  0.7 - 4.0 K/uL Final  . Monocytes Relative 09/08/2013 8  3 - 12 % Final  . Monocytes Absolute 09/08/2013 0.5  0.1 - 1.0 K/uL Final  . Eosinophils Relative 09/08/2013 4  0 - 5 % Final  . Eosinophils Absolute 09/08/2013 0.2  0.0 - 0.7 K/uL Final  . Basophils Relative 09/08/2013 1  0 - 1 % Final  . Basophils Absolute 09/08/2013 0.1  0.0 - 0.1 K/uL Final  . Retic Ct Pct 09/08/2013 2.1  0.4 - 3.1 % Final  . RBC. 09/08/2013 4.77  4.22 - 5.81 MIL/uL Final  . Retic Count, Manual 09/08/2013 100.2  19.0 - 186.0 K/uL Final  . Sodium 09/08/2013 141  137 - 147 mEq/L Final  . Potassium 09/08/2013 4.3  3.7 -  5.3 mEq/L Final  . Chloride 09/08/2013 101  96 - 112 mEq/L Final  . CO2 09/08/2013 30  19 - 32 mEq/L Final  . Glucose, Bld 09/08/2013 142* 70 - 99 mg/dL Final  . BUN 09/08/2013 17  6 - 23 mg/dL Final  . Creatinine, Ser 09/08/2013 0.87  0.50 - 1.35 mg/dL Final  . Calcium 09/08/2013 10.0  8.4 - 10.5 mg/dL Final  . Total Protein 09/08/2013 7.6  6.0 - 8.3 g/dL Final  . Albumin 09/08/2013 3.8  3.5 - 5.2 g/dL Final  . AST 09/08/2013 19  0 - 37 U/L Final  . ALT 09/08/2013 20  0 - 53 U/L Final  . Alkaline Phosphatase 09/08/2013 96  39 - 117 U/L Final  . Total Bilirubin 09/08/2013 0.6  0.3 - 1.2  mg/dL Final  . GFR calc non Af Amer 09/08/2013 75* >90 mL/min Final  . GFR calc Af Amer 09/08/2013 87* >90 mL/min Final   Comment: (NOTE)                          The eGFR has been calculated using the CKD EPI equation.                          This calculation has not been validated in all clinical situations.                          eGFR's persistently <90 mL/min signify possible Chronic Kidney                          Disease.  Infusion on 09/05/2013  Component Date Value Ref Range Status  . Beta-2 Microglobulin 09/05/2013 3.30* <=2.51 mg/L Final   Performed at [facility]  Anti-coag visit on 09/04/2013  Component Date Value Ref Range Status  . INR 09/04/2013 1.9   Final  Anti-coag visit on 08/31/2013  Component Date Value Ref Range Status  . INR 08/31/2013 1.3   Final  Hospital Outpatient Visit on 08/31/2013  Component Date Value Ref Range Status  . Creatinine, Ser 08/31/2013 1.10  0.50 - 1.35 mg/dL Final  Anti-coag visit on 08/14/2013  Component Date Value Ref Range Status  . INR 08/14/2013 1.0   Final  Admission on 08/10/2013, Discharged on 08/11/2013  Component Date Value Ref Range Status  . Prothrombin Time 08/10/2013 13.9  11.6 - 15.2 seconds Final  . INR 08/10/2013 1.09  0.00 - 1.49 Final  . Glucose-Capillary 08/10/2013 125* 70 - 99 mg/dL Final  . Comment 1 08/10/2013 Documented in Chart   Final  . Glucose-Capillary 08/10/2013 109* 70 - 99 mg/dL Final  . Glucose-Capillary 08/10/2013 194* 70 - 99 mg/dL Final    PATHOLOGY:  for HUCK, ASHWORTH ([id number]) Patient: CORRELL, DENBOW Collected: 08/10/2013 Client: [facility] Accession: [id number] Received: 08/10/2013 [name] DOB: 1923-11-21 Age: 8 Gender: M Reported: 08/11/2013 [address] Patient Ph: [phone] MRN #: [id number] [place], [place] [address] Visit #: [id number] Chart #: Phone: [phone] Fax: CC: REPORT OF SURGICAL PATHOLOGY FINAL DIAGNOSIS Diagnosis Prostate, chips -  PROSTATIC ADENOCARCINOMA, GLEASON'S GRADE 4+3=7. - PERINEURAL INVASION IDENTIFIED. Microscopic Comment Representative sections of tissue demonstrate extensive involvement by prostatic adenocarcinoma. The adenocarcinoma involves virtually all tissue (approximately 70% total tissue area of involvement). There are areas of crushed and cauterized tumor in which a tertiary pattern 5  adenocarcinoma cannot be excluded. The case was discussed with Dr. Diona Fanti on 08/11/13. (CRR:gt, 08/11/13) Mali RUND DO Pathologist, Electronic Signature (Case signed 08/11/2013) Specimen Gross and Clinical Information Specimen(s) Obtained: Prostate, chips Specimen Clinical Information benign prostatic hypertrophy, bladder calculi (kp) Gross The specimen is received in formalin and consists of a 14 gram, 5.9 x 4.5 x 2.0 cm aggregate of tan pink, rubbery, focally cauterized soft tissue fragments and tan brown, hard, irregularly shaped possible calculi. Representative sections are submitted in six cassettes. (KL:kh 08-10-13) Report signed out from the following location(s) Technical Component performed at [facility]. [address],[place] [address].[id number]., Interpretation performed at [place] [place], [place], [place] [address]. CLIA #: [id number],     Urinalysis No results found for this basename: colorurine,  appearanceur,  labspec,  phurine,  glucoseu,  hgbur,  bilirubinur,  ketonesur,  proteinur,  urobilinogen,  nitrite,  leukocytesur    RADIOGRAPHIC STUDIES: Nm Bone Scan Whole Body  08/30/2013   CLINICAL DATA:  Prostate cancer  EXAM: NUCLEAR MEDICINE WHOLE BODY BONE SCAN  TECHNIQUE: Whole body anterior and posterior images were obtained approximately 3 hours after intravenous injection of radiopharmaceutical.  RADIOPHARMACEUTICALS:  25 mCi Technetium-46mMDP IV  COMPARISON:  None; correlation chest radiograph 08/03/2013, PET-CT  07/31/2008  FINDINGS: Abnormal foci of increased tracer accumulation identified in the mid sternum, posterior lower RIGHT rib approximately 10th, and at lower lumbar spine at approximately L3 vertebral body.  These areas are worrisome for osseous metastatic disease.  No other sites of abnormal osseous tracer accumulation are identified which are suspicious for additional sites of metastasis.  Minimal uptake at the knees and shoulders, typically degenerative.  Expected urinary tract and soft tissue distribution of tracer.  IMPRESSION: Abnormal foci of increased tracer localization in the lumbar spine, posterior lower RIGHT rib approximately tenth, and in the mid sternum worrisome for osseous metastases.   Electronically Signed   By: MLavonia DanaM.D.   On: 08/30/2013 15:04   Ct Abdomen Pelvis W Contrast  08/31/2013   CLINICAL DATA:  Prostate cancer.  History of bladder stones.  EXAM: CT ABDOMEN AND PELVIS WITH CONTRAST  TECHNIQUE: Multidetector CT imaging of the abdomen and pelvis was performed using the standard protocol following bolus administration of intravenous contrast.  CONTRAST:  104mOMNIPAQUE IOHEXOL 300 MG/ML  SOLN  COMPARISON:  CT of the abdomen and pelvis 08/05/2010.  FINDINGS: Lung Bases: 4 mm nodule in the inferior segment of the lingula (image 2 of series 3), unchanged compared to remote prior study 08/05/2010; this can be considered benign and requires no imaging followup. Patulous distal esophagus. Small hiatal hernia. Mild calcifications of the aortic valve.  Abdomen/Pelvis: Status post cholecystectomy. Mild periportal edema is nonspecific, and unchanged compared to the remote prior examination. No focal hepatic lesions are noted. Multiple tiny calcifications scattered throughout the pancreatic parenchyma, likely sequela of prior episodes of pancreatitis. No focal cystic or solid pancreatic lesions. The appearance of the spleen, bilateral adrenal glands and bilateral kidneys is unremarkable.  Large diverticulae from the third portion of the duodenum are incidentally noted. No surrounding inflammatory changes to suggest a duodenal diverticulitis at this time.  Image 85 of series 2 demonstrates a 2 mm calcification which appears to be within the distal right ureter immediately before the right ureterovesicular junction. No associated right-sided hydroureteronephrosis to suggest urinary tract obstruction at this time. Images 87 and 92 of series 2 also demonstrate tiny calcifications measuring 2 and 3 mm along  the posterior wall of the urinary bladder and within what appears to be a TURP defect in the prostate gland along the proximal aspect of the prostatic urethra. Prostate gland appears mildly enlarged measuring 5.3 x 4.6 cm.  Normal appendix. No significant volume of ascites. No pneumoperitoneum. No pathologic distention of small bowel. No lymphadenopathy identified within the abdomen or pelvis. Atherosclerosis throughout the abdominal and pelvic vasculature, without evidence of aneurysm or dissection. Several small diverticulae are seen involving the anterior wall of the urinary bladder.  Musculoskeletal: There several tiny scattered sclerotic foci within the visualized axial and appendicular skeleton, unchanged compared to the prior examination, favored to represent tiny bone islands, although these may alternatively represent a unchanged bony metastases. No other definite aggressive appearing lytic or blastic lesions are noted in the visualized portions of the skeleton were on today's examination.  IMPRESSION: 1. Other than several tiny sclerotic bone lesions scattered throughout the visualized axial and appendicular skeleton, which appear stable in size and appearance compared to the prior study from 2012, there are no definite findings to suggest metastatic disease in the abdomen or pelvis. These bony lesions may simply represent tiny bone islands, or could represent unchanged sclerotic metastases.  Clinical correlation is recommended. 2. 2 mm calcification in the distal third of the right ureter immediately before the right ureterovesicular junction appears nonobstructive. There is also a tiny 2 mm calcification lying dependently in the urinary bladder, and a 3 mm calcification which appears near the proximal prostatic urethra within what appears to be a TURP defect in the prostate gland. 3. Other than mild enlargement and post procedural changes of presumed TURP, the prostate gland is generally unremarkable in appearance. 4. Several small bladder wall diverticulae are noted along the anterior wall the urinary bladder. 5. Large duodenal diverticulae, without findings to suggest acute duodenum diverticulitis at this time.   Electronically Signed   By: Trudie Reed M.D.   On: 08/31/2013 14:16    ASSESSMENT:  #1. Stage IV prostate cancer with bone metastases. 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 Jamey Ripa in October 2005  #3. diffuse large B-cell lymphoma clinically stage IIIAS STATUS post cervical lymph node biopsy on 08/03/2003. His lymphoma CD20 positive and PET scan showed positivity in the spleen. Bone marrow aspiration and biopsy were negative however. He received R. CHOP x6 cycles with a complete remission established by PET scan criteria on 11/23/2003. He completed 6 full cycles total. He remains without disease recurrence. He remains free of B. ymptomatology.  #4. Atrial fibrillation on warfarin, controlled ventricular response.  #4. Hypertension, controlled.  #6. Basal cell carcinoma, status post recent resection from nose, healing well.  #7. Diabetes mellitus, non-insulin requiring, controlled.  #8. Bladder stones, status post TURP plus cystolitholapoxy, at which time Gleason's 4+3 equals 7 adenocarcinoma of the prostate was diagnosed.      PLAN:  #1. Begin Casodex 50 mg  daily. #2. Return in 2 weeks to begin long-acting LHRH agonist therapy. #3. In the presence of the patient's wife and daughter, the nature of prostate cancer was discussed and the fact that it is endocrine responsive was highlighted. High probability of response and prolongation of survival.   All questions were answered. The patient knows to call the clinic with any problems, questions or concerns. We can certainly see the patient much sooner if necessary.   I spent 25 minutes  counseling the patient face to face. The total time spent in the appointment was 30 minutes.    [name], MD 09/08/2013 12:09 PM  DISCLAIMER:  This note was dictated with voice recognition software.  Similar sounding words can inadvertently be transcribed inaccurately and may not be corrected upon review.

## 2013-09-08 NOTE — Patient Instructions (Addendum)
[facility] Discharge Instructions  RECOMMENDATIONS MADE BY THE CONSULTANT AND ANY TEST RESULTS WILL BE SENT TO YOUR REFERRING PHYSICIAN.  EXAM FINDINGS BY THE PHYSICIAN TODAY AND SIGNS OR SYMPTOMS TO REPORT TO CLINIC OR PRIMARY PHYSICIAN: Exam and findings as discussed by Dr. Barnet Glasgow.  Will get you started on Casodex and Lupron injections for your prostate cancer.  Report uncontrolled pain, increased fatigue or shortness of breath.  MEDICATIONS PRESCRIBED:  Casodex - take as prescribed.   INSTRUCTIONS/FOLLOW-UP: Followp-up in 2 weeks with lupron injection and office visit.  Thank you for choosing [facility] to provide your oncology and hematology care.  To afford each patient quality time with our providers, please arrive at least 15 minutes before your scheduled appointment time.  With your help, our goal is to use those 15 minutes to complete the necessary work-up to ensure our physicians have the information they need to help with your evaluation and healthcare recommendations.    Effective January 1st, 2014, we ask that you re-schedule your appointment with our physicians should you arrive 10 or more minutes late for your appointment.  We strive to give you quality time with our providers, and arriving late affects you and other patients whose appointments are after yours.    Again, thank you for choosing [facility].  Our hope is that these requests will decrease the amount of time that you wait before being seen by our physicians.       _____________________________________________________________  Should you have questions after your visit to [facility], please contact our office at [phone] between the hours of 8:30 a.m. and 5:00 p.m.  Voicemails left after 4:30 p.m. will not be returned until the following business day.  For prescription refill requests, have your pharmacy contact our office with your prescription  refill request.     Bicalutamide tablets What is this medicine? BICALUTAMIDE (bye ka LOO ta mide) blocks the effect of the male hormone testosterone on the prostate. This medicine is used to treat advanced prostate cancer in men. It is given with other treatments. This medicine may be used for other purposes; ask your health care provider or pharmacist if you have questions. COMMON BRAND NAME(S): Casodex What should I tell my health care provider before I take this medicine? They need to know if you have any of these conditions: -if you are male (this medicine is not for use in women) -liver disease -an unusual or allergic reaction to bicalutamide, other medicines, foods, dyes, or preservatives How should I use this medicine? Take this medicine by mouth with a glass of water. You may take it with or without food. Follow the directions on the prescription label. Take your medicine at regular intervals. Do not take your medicine more often than directed. Do not stop taking except on your doctor's advice. Talk to your pediatrician regarding the use of this medicine in children. Special care may be needed. Overdosage: If you think you have taken too much of this medicine contact a poison control center or emergency room at once. NOTE: This medicine is only for you. Do not share this medicine with others. What if I miss a dose? If you miss a dose, take it as soon as you can. If it is almost time for your next dose, take only that dose. Do not take double or extra doses. What may interact with this medicine? -warfarin This list may not describe all possible interactions.  Give your health care provider a list of all the medicines, herbs, non-prescription drugs, or dietary supplements you use. Also tell them if you smoke, drink alcohol, or use illegal drugs. Some items may interact with your medicine. What should I watch for while using this medicine? Visit your doctor or health care professional  for regular checks on your progress. You may need regular tests to make sure your liver is working properly. This medicine should not be used in women. Serious side effects to an unborn child are possible. Talk to your doctor or pharmacist for more information. What side effects may I notice from receiving this medicine? Side effects that you should report to your doctor or health care professional as soon as possible: -allergic reactions like skin rash, itching or hives, swelling of the face, lips, or tongue -blood in the urine -breathing problems -chest pain -dark urine -severe nausea and vomiting -yellowing of the eyes or skin Side effects that usually do not require medical attention (report to your doctor or health care professional if they continue or are bothersome): -diarrhea -hot flashes -loss of appetite -nausea -weak or tired This list may not describe all possible side effects. Call your doctor for medical advice about side effects. You may report side effects to FDA at 1-800-FDA-1088. Where should I keep my medicine? Keep out of the reach of children. Store between 20 and 25 degrees C (68 and 77 degrees F). Throw away any unused medicine after the expiration date. NOTE: This sheet is a summary. It may not cover all possible information. If you have questions about this medicine, talk to your doctor, pharmacist, or health care provider.  2014, [employer]vier/[employer] Standard. (2007-06-20 14:49:46) Leuprolide injection What is this medicine? LEUPROLIDE (loo PROE lide) is a man-made hormone. It is used to treat the symptoms of prostate cancer. This medicine may also be used to treat children with early onset of puberty. It may be used for other hormonal conditions. This medicine may be used for other purposes; ask your health care provider or pharmacist if you have questions. COMMON BRAND NAME(S): Lupron What should I tell my health care provider before I take this medicine? They need  to know if you have any of these conditions: -diabetes -heart disease or previous heart attack -high blood pressure -high cholesterol -pain or difficulty passing urine -spinal cord metastasis -stroke -tobacco smoker -an unusual or allergic reaction to leuprolide, benzyl alcohol, other medicines, foods, dyes, or preservatives -pregnant or trying to get pregnant -breast-feeding How should I use this medicine? This medicine is for injection under the skin or into a muscle. You will be taught how to prepare and give this medicine. Use exactly as directed. Take your medicine at regular intervals. Do not take your medicine more often than directed. It is important that you put your used needles and syringes in a special sharps container. Do not put them in a trash can. If you do not have a sharps container, call your pharmacist or healthcare provider to get one. Talk to your pediatrician regarding the use of this medicine in children. While this medicine may be prescribed for children as young as 8 years for selected conditions, precautions do apply. Overdosage: If you think you have taken too much of this medicine contact a poison control center or emergency room at once. NOTE: This medicine is only for you. Do not share this medicine with others. What if I miss a dose? If you miss a dose, take it as  soon as you can. If it is almost time for your next dose, take only that dose. Do not take double or extra doses. What may interact with this medicine? Do not take this medicine with any of the following medications: -chasteberry This medicine may also interact with the following medications: -herbal or dietary supplements, like black cohosh or DHEA -male hormones, like estrogens or progestins and birth control pills, patches, rings, or injections -male hormones, like testosterone This list may not describe all possible interactions. Give your health care provider a list of all the medicines,  herbs, non-prescription drugs, or dietary supplements you use. Also tell them if you smoke, drink alcohol, or use illegal drugs. Some items may interact with your medicine. What should I watch for while using this medicine? Visit your doctor or health care professional for regular checks on your progress. During the first week, your symptoms may get worse, but then will improve as you continue your treatment. You may get hot flashes, increased bone pain, increased difficulty passing urine, or an aggravation of nerve symptoms. Discuss these effects with your doctor or health care professional, some of them may improve with continued use of this medicine. Male patients may experience a menstrual cycle or spotting during the first 2 months of therapy with this medicine. If this continues, contact your doctor or health care professional. What side effects may I notice from receiving this medicine? Side effects that you should report to your doctor or health care professional as soon as possible: -allergic reactions like skin rash, itching or hives, swelling of the face, lips, or tongue -breathing problems -chest pain -depression or memory disorders -pain in your legs or groin -pain at site where injected -severe headache -swelling of the feet and legs -visual changes -vomiting Side effects that usually do not require medical attention (report to your doctor or health care professional if they continue or are bothersome): -breast swelling or tenderness -decrease in sex drive or performance -diarrhea -hot flashes -loss of appetite -muscle, joint, or bone pains -nausea -redness or irritation at site where injected -skin problems or acne This list may not describe all possible side effects. Call your doctor for medical advice about side effects. You may report side effects to FDA at 1-800-FDA-1088. Where should I keep my medicine? Keep out of the reach of children. Store below 25 degrees C (77  degrees F). Do not freeze. Protect from light. Do not use if it is not clear or if there are particles present. Throw away any unused medicine after the expiration date. NOTE: This sheet is a summary. It may not cover all possible information. If you have questions about this medicine, talk to your doctor, pharmacist, or health care provider.  2014, [employer]vier/Gold Standard. (2008-08-21 13:26:20)

## 2013-09-09 LAB — TESTOSTERONE: Testosterone: 203 ng/dL — ABNORMAL LOW (ref 300–890)

## 2013-09-09 LAB — PSA: PSA: 44.21 ng/mL — ABNORMAL HIGH (ref <=4.00)

## 2013-09-12 DIAGNOSIS — H2589 Other age-related cataract: Secondary | ICD-10-CM | POA: Diagnosis not present

## 2013-09-12 DIAGNOSIS — H269 Unspecified cataract: Secondary | ICD-10-CM | POA: Diagnosis not present

## 2013-09-12 DIAGNOSIS — H2510 Age-related nuclear cataract, unspecified eye: Secondary | ICD-10-CM | POA: Diagnosis not present

## 2013-09-15 NOTE — Progress Notes (Signed)
Labs drawn

## 2013-09-19 ENCOUNTER — Ambulatory Visit (INDEPENDENT_AMBULATORY_CARE_PROVIDER_SITE_OTHER): Payer: Medicare Other | Admitting: Urology

## 2013-09-19 DIAGNOSIS — N400 Enlarged prostate without lower urinary tract symptoms: Secondary | ICD-10-CM | POA: Diagnosis not present

## 2013-09-19 DIAGNOSIS — C61 Malignant neoplasm of prostate: Secondary | ICD-10-CM | POA: Diagnosis not present

## 2013-09-19 DIAGNOSIS — R3915 Urgency of urination: Secondary | ICD-10-CM | POA: Diagnosis not present

## 2013-09-20 ENCOUNTER — Ambulatory Visit (INDEPENDENT_AMBULATORY_CARE_PROVIDER_SITE_OTHER): Payer: Medicare Other | Admitting: *Deleted

## 2013-09-20 DIAGNOSIS — I4891 Unspecified atrial fibrillation: Secondary | ICD-10-CM | POA: Diagnosis not present

## 2013-09-20 DIAGNOSIS — Z5181 Encounter for therapeutic drug level monitoring: Secondary | ICD-10-CM

## 2013-09-20 DIAGNOSIS — Z7901 Long term (current) use of anticoagulants: Secondary | ICD-10-CM

## 2013-09-20 LAB — POCT INR: INR: 2.2

## 2013-09-25 ENCOUNTER — Encounter (HOSPITAL_COMMUNITY): Payer: Self-pay

## 2013-09-25 ENCOUNTER — Encounter (HOSPITAL_COMMUNITY): Payer: Medicare Other

## 2013-09-25 ENCOUNTER — Encounter (HOSPITAL_COMMUNITY): Payer: Medicare Other | Attending: Hematology and Oncology

## 2013-09-25 VITALS — BP 143/77 | HR 73 | Temp 97.7°F | Resp 16 | Wt 189.2 lb

## 2013-09-25 DIAGNOSIS — I4891 Unspecified atrial fibrillation: Secondary | ICD-10-CM

## 2013-09-25 DIAGNOSIS — Z5111 Encounter for antineoplastic chemotherapy: Secondary | ICD-10-CM | POA: Diagnosis not present

## 2013-09-25 DIAGNOSIS — C61 Malignant neoplasm of prostate: Secondary | ICD-10-CM | POA: Diagnosis not present

## 2013-09-25 DIAGNOSIS — C7951 Secondary malignant neoplasm of bone: Secondary | ICD-10-CM | POA: Diagnosis not present

## 2013-09-25 DIAGNOSIS — E119 Type 2 diabetes mellitus without complications: Secondary | ICD-10-CM

## 2013-09-25 DIAGNOSIS — C7952 Secondary malignant neoplasm of bone marrow: Secondary | ICD-10-CM | POA: Diagnosis not present

## 2013-09-25 DIAGNOSIS — C44319 Basal cell carcinoma of skin of other parts of face: Secondary | ICD-10-CM

## 2013-09-25 DIAGNOSIS — I10 Essential (primary) hypertension: Secondary | ICD-10-CM

## 2013-09-25 MED ORDER — LEUPROLIDE ACETATE (3 MONTH) 22.5 MG IM KIT
22.5000 mg | PACK | Freq: Once | INTRAMUSCULAR | Status: AC
  Administered 2013-09-25: 22.5 mg via INTRAMUSCULAR
  Filled 2013-09-25: qty 22.5

## 2013-09-25 NOTE — Progress Notes (Signed)
[facility]  OFFICE PROGRESS [name] Ewing, MD [address] [place] [address]  DIAGNOSIS: Prostate cancer - Plan: CBC with Differential, Comprehensive metabolic panel, Testosterone, PSA, SCHEDULING COMMUNICATION INJECTION, leuprolide (LUPRON) injection 22.5 mg  Bone metastases - Plan: CBC with Differential, Comprehensive metabolic panel, Testosterone, PSA  Chief Complaint  Patient presents with  . Prostate Cancer  . Bone metastases    CURRENT THERAPY: Casodex 50 mg daily  INTERVAL HISTORY: [name] 80 y.o. 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 PSA done on 09/08/2013 was 44.21 with a serum testosterone level of 203. He is tolerating Casodex well with no hot flashes or breast pain. He has had some constipation. He denies any fever, cough, wheezing, melena, magnesia, hematuria, lower extremity swelling or redness, skin rash, headache, or seizures.  MEDICAL HISTORY: Past Medical History  Diagnosis Date  . Hypertension   . Atrial fibrillation     Dr. Harl Bowie- [facility] follows saw 11'14  . 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 Margot Chimes, thus far without recurrence.   . NHL (non-Hodgkin's lymphoma) 08/18/2011    Diffuse large B-cell lymphoma, clinically stage IIIA, CD20 positive, status post cervical lymph node biopsy 08/03/2003 on the left. PET scan was also positive in the spleen and small bowel region, but bone marrow aspiration and biopsy were negative. So he essentially had stage IIIAs. He received R-CHOP x6  cycles with CR established by PET scan criteria on 11/23/2003 with no evidence for relapse th  . Cataracts, both eyes     surgery planned May 2015  . Bladder stones     tx. with oral meds and antibiotics, now surgery planned  . Diabetes mellitus without complication     INTERIM HISTORY: has HYPERTENSION, UNSPECIFIED; ATRIAL FIBRILLATION; Encounter for long-term (current) use of anticoagulants; NHL (non-Hodgkin's lymphoma); GIST (gastrointestinal stromal tumor), malignant; Encounter for therapeutic drug monitoring; Hypertrophy of prostate with urinary obstruction and other lower urinary tract symptoms (LUTS); and Prostate cancer on his problem list.    ALLERGIES:  has No Known Allergies.  MEDICATIONS: has a current medication list which includes the following prescription(s): acetaminophen, amitriptyline, azelastine hcl, bicalutamide, cranberry, diltiazem, docusate calcium, durezol, gentamicin, glipizide, linagliptin, lisinopril-hydrochlorothiazide, omeprazole, potassium chloride, senna, and warfarin.  SURGICAL HISTORY:  Past Surgical History  Procedure Laterality Date  . Cholecystectomy    . Incision and drainage perirectal abscess    . Portacath placement      insertion and removal -last chemotherapy 10 yrs ago  . Lymph node dissection Left     '05  . Port-a-cath removal    . Transurethral resection of prostate N/A 08/10/2013    Procedure: TRANSURETHRAL RESECTION OF THE PROSTATE WITH GYRUS INSTRUMENTS;  Surgeon: [name], MD;  Location: [facility];  Service: Urology;  Laterality: N/A;  . Cystoscopy with litholapaxy N/A 08/10/2013    Procedure: CYSTOSCOPY WITH LITHOLAPAXY WITH [name];  Surgeon: [name], MD;  Location: [facility];  Service: Urology;  Laterality: N/A;  . Cataract extraction Right 08/2013    FAMILY HISTORY: family history is not on  file.  SOCIAL HISTORY:  reports that he has never smoked. He has never used smokeless tobacco. He reports that he does not  drink alcohol or use illicit drugs.  REVIEW OF SYSTEMS:  Other than that discussed above is noncontributory.  PHYSICAL EXAMINATION: ECOG PERFORMANCE STATUS: 1 - Symptomatic but completely ambulatory  Blood pressure 143/77, pulse 73, temperature 97.7 F (36.5 C), temperature source Oral, resp. rate 16, weight 189 lb 3.2 oz (85.821 kg).  GENERAL:alert, no distress and comfortable SKIN: skin color, texture, turgor are normal, no rashes or significant lesions EYES: PERLA; Conjunctiva are pink and non-injected, sclera clear SINUSES: No redness or tenderness over maxillary or ethmoid sinuses OROPHARYNX:no exudate, no erythema on lips, buccal mucosa, or tongue. NECK: supple, thyroid normal size, non-tender, without nodularity. No masses CHEST: Increased AP diameter with no breast masses. No gynecomastia. LYMPH:  no palpable lymphadenopathy in the cervical, axillary or inguinal LUNGS: clear to auscultation and percussion with normal breathing effort HEART: Irregularly irregular with no S3. ABDOMEN:abdomen soft, non-tender and normal bowel sounds MUSCULOSKELETAL:no cyanosis of digits and no clubbing. Range of motion normal.  NEURO: alert & oriented x 3 with fluent speech, no focal motor/sensory deficits. Decreased hearing acuity.   LABORATORY DATA: Anti-coag visit on 09/20/2013  Component Date Value Ref Range Status  . INR 09/20/2013 2.2   Final  Office Visit on 09/08/2013  Component Date Value Ref Range Status  . Testosterone 09/08/2013 203* 300 - 890 ng/dL Final   Comment: (NOTE)                                   Tanner Stage       Male              Male                                       I              < 30 ng/dL        < 10 ng/dL                                       II             < 150 ng/dL       < 30 ng/dL                                       III            100-320 ng/dL     < 35 ng/dL                                       IV             200-970 ng/dL     15-40 ng/dL                                        V/Adult  300-890 ng/dL     10-70 ng/dL                          Performed at [facility]  . PSA 09/08/2013 44.21* <=4.00 ng/mL Final   Comment: (NOTE)                          Test Methodology: ECLIA PSA (Electrochemiluminescence Immunoassay)                          For PSA values from 2.5-4.0, particularly in younger men <60 years                          old, the AUA and NCCN suggest testing for % Free PSA (3515) and                          evaluation of the rate of increase in PSA (PSA velocity).                          Performed at [facility]  . WBC 09/08/2013 5.9  4.0 - 10.5 K/uL Final  . RBC 09/08/2013 4.77  4.22 - 5.81 MIL/uL Final  . Hemoglobin 09/08/2013 14.5  13.0 - 17.0 g/dL Final  . HCT 09/08/2013 42.2  39.0 - 52.0 % Final  . MCV 09/08/2013 88.5  78.0 - 100.0 fL Final  . MCH 09/08/2013 30.4  26.0 - 34.0 pg Final  . MCHC 09/08/2013 34.4  30.0 - 36.0 g/dL Final  . RDW 09/08/2013 14.3  11.5 - 15.5 % Final  . Platelets 09/08/2013 205  150 - 400 K/uL Final  . Neutrophils Relative % 09/08/2013 55  43 - 77 % Final  . Neutro Abs 09/08/2013 3.3  1.7 - 7.7 K/uL Final  . Lymphocytes Relative 09/08/2013 32  12 - 46 % Final  . Lymphs Abs 09/08/2013 1.9  0.7 - 4.0 K/uL Final  . Monocytes Relative 09/08/2013 8  3 - 12 % Final  . Monocytes Absolute 09/08/2013 0.5  0.1 - 1.0 K/uL Final  . Eosinophils Relative 09/08/2013 4  0 - 5 % Final  . Eosinophils Absolute 09/08/2013 0.2  0.0 - 0.7 K/uL Final  . Basophils Relative 09/08/2013 1  0 - 1 % Final  . Basophils Absolute 09/08/2013 0.1  0.0 - 0.1 K/uL Final  . Retic Ct Pct 09/08/2013 2.1  0.4 - 3.1 % Final  . RBC. 09/08/2013 4.77  4.22 - 5.81 MIL/uL Final  . Retic Count, Manual 09/08/2013 100.2  19.0 - 186.0 K/uL Final  . Sodium 09/08/2013 141  137 - 147 mEq/L Final  . Potassium 09/08/2013 4.3  3.7 - 5.3 mEq/L Final  . Chloride 09/08/2013 101  96 - 112 mEq/L Final  . CO2 09/08/2013  30  19 - 32 mEq/L Final  . Glucose, Bld 09/08/2013 142* 70 - 99 mg/dL Final  . BUN 09/08/2013 17  6 - 23 mg/dL Final  . Creatinine, Ser 09/08/2013 0.87  0.50 - 1.35 mg/dL Final  . Calcium 09/08/2013 10.0  8.4 - 10.5 mg/dL Final  . Total Protein 09/08/2013 7.6  6.0 - 8.3 g/dL Final  . Albumin 09/08/2013 3.8  3.5 - 5.2 g/dL Final  . AST 09/08/2013 19  0 - 37 U/L  Final  . ALT 09/08/2013 20  0 - 53 U/L Final  . Alkaline Phosphatase 09/08/2013 96  39 - 117 U/L Final  . Total Bilirubin 09/08/2013 0.6  0.3 - 1.2 mg/dL Final  . GFR calc non Af Amer 09/08/2013 75* >90 mL/min Final  . GFR calc Af Amer 09/08/2013 87* >90 mL/min Final   Comment: (NOTE)                          The eGFR has been calculated using the CKD EPI equation.                          This calculation has not been validated in all clinical situations.                          eGFR's persistently <90 mL/min signify possible Chronic Kidney                          Disease.  Infusion on 09/05/2013  Component Date Value Ref Range Status  . Beta-2 Microglobulin 09/05/2013 3.30* <=2.51 mg/L Final   Performed at [facility]  Anti-coag visit on 09/04/2013  Component Date Value Ref Range Status  . INR 09/04/2013 1.9   Final  Anti-coag visit on 08/31/2013  Component Date Value Ref Range Status  . INR 08/31/2013 1.3   Final  Hospital Outpatient Visit on 08/31/2013  Component Date Value Ref Range Status  . Creatinine, Ser 08/31/2013 1.10  0.50 - 1.35 mg/dL Final    PATHOLOGY:   for NEO, YEPIZ ([id number])  Patient: HAMSA, LAURICH Collected: 08/10/2013 Client: [facility]  Accession: [id number] Received: 08/10/2013 [name]  DOB: April 06, 1924 Age: 4 Gender: M Reported: 08/11/2013 [address]  Patient Ph: [phone] MRN #: [id number] [place], [place] [address]  Visit #: [id number] Chart #: Phone: [phone] Fax:  CC:  REPORT OF SURGICAL PATHOLOGY  FINAL DIAGNOSIS  Diagnosis  Prostate, chips    - PROSTATIC ADENOCARCINOMA, GLEASON'S GRADE 4+3=7.  - PERINEURAL INVASION IDENTIFIED.  Microscopic Comment  Representative sections of tissue demonstrate extensive involvement by prostatic adenocarcinoma. The  adenocarcinoma involves virtually all tissue (approximately 70% total tissue area of involvement). There are  areas of crushed and cauterized tumor in which a tertiary pattern 5 adenocarcinoma cannot be excluded.  The case was discussed with Dr. Diona Fanti on 08/11/13. (CRR:gt, 08/11/13)  Mali RUND DO  Pathologist, Electronic Signature  (Case signed 08/11/2013)  Specimen Gross and Clinical Information  Specimen(s) Obtained:  Prostate, chips  Specimen Clinical Information  benign prostatic hypertrophy, bladder calculi (kp)  Gross  The specimen is received in formalin and consists of a 14 gram, 5.9 x 4.5 x 2.0 cm aggregate of tan pink,  rubbery, focally cauterized soft tissue fragments and tan brown, hard, irregularly shaped possible calculi.  Representative sections are submitted in six cassettes. (KL:kh 08-10-13)  Report signed out from the following location(s)  Technical Component performed at [facility]. [address],[place] [address].[id number].,  Interpretation performed at [place],  [place], [place] [address]. CLIA #: [id number],   Urinalysis No results found for this basename: colorurine,  appearanceur,  labspec,  phurine,  glucoseu,  hgbur,  bilirubinur,  ketonesur,  proteinur,  urobilinogen,  nitrite,  leukocytesur    RADIOGRAPHIC STUDIES: Nm Bone Scan Whole  Body  08/30/2013   CLINICAL DATA:  Prostate cancer  EXAM: NUCLEAR MEDICINE WHOLE BODY BONE SCAN  TECHNIQUE: Whole body anterior and posterior images were obtained approximately 3 hours after intravenous injection of radiopharmaceutical.  RADIOPHARMACEUTICALS:  25 mCi Technetium-27mMDP IV  COMPARISON:  None; correlation chest radiograph  08/03/2013, PET-CT 07/31/2008  FINDINGS: Abnormal foci of increased tracer accumulation identified in the mid sternum, posterior lower RIGHT rib approximately 10th, and at lower lumbar spine at approximately L3 vertebral body.  These areas are worrisome for osseous metastatic disease.  No other sites of abnormal osseous tracer accumulation are identified which are suspicious for additional sites of metastasis.  Minimal uptake at the knees and shoulders, typically degenerative.  Expected urinary tract and soft tissue distribution of tracer.  IMPRESSION: Abnormal foci of increased tracer localization in the lumbar spine, posterior lower RIGHT rib approximately tenth, and in the mid sternum worrisome for osseous metastases.   Electronically Signed   By: MLavonia DanaM.D.   On: 08/30/2013 15:04   Ct Abdomen Pelvis W Contrast  08/31/2013   CLINICAL DATA:  Prostate cancer.  History of bladder stones.  EXAM: CT ABDOMEN AND PELVIS WITH CONTRAST  TECHNIQUE: Multidetector CT imaging of the abdomen and pelvis was performed using the standard protocol following bolus administration of intravenous contrast.  CONTRAST:  1045mOMNIPAQUE IOHEXOL 300 MG/ML  SOLN  COMPARISON:  CT of the abdomen and pelvis 08/05/2010.  FINDINGS: Lung Bases: 4 mm nodule in the inferior segment of the lingula (image 2 of series 3), unchanged compared to remote prior study 08/05/2010; this can be considered benign and requires no imaging followup. Patulous distal esophagus. Small hiatal hernia. Mild calcifications of the aortic valve.  Abdomen/Pelvis: Status post cholecystectomy. Mild periportal edema is nonspecific, and unchanged compared to the remote prior examination. No focal hepatic lesions are noted. Multiple tiny calcifications scattered throughout the pancreatic parenchyma, likely sequela of prior episodes of pancreatitis. No focal cystic or solid pancreatic lesions. The appearance of the spleen, bilateral adrenal glands and bilateral kidneys  is unremarkable. Large diverticulae from the third portion of the duodenum are incidentally noted. No surrounding inflammatory changes to suggest a duodenal diverticulitis at this time.  Image 85 of series 2 demonstrates a 2 mm calcification which appears to be within the distal right ureter immediately before the right ureterovesicular junction. No associated right-sided hydroureteronephrosis to suggest urinary tract obstruction at this time. Images 87 and 92 of series 2 also demonstrate tiny calcifications measuring 2 and 3 mm along the posterior wall of the urinary bladder and within what appears to be a TURP defect in the prostate gland along the proximal aspect of the prostatic urethra. Prostate gland appears mildly enlarged measuring 5.3 x 4.6 cm.  Normal appendix. No significant volume of ascites. No pneumoperitoneum. No pathologic distention of small bowel. No lymphadenopathy identified within the abdomen or pelvis. Atherosclerosis throughout the abdominal and pelvic vasculature, without evidence of aneurysm or dissection. Several small diverticulae are seen involving the anterior wall of the urinary bladder.  Musculoskeletal: There several tiny scattered sclerotic foci within the visualized axial and appendicular skeleton, unchanged compared to the prior examination, favored to represent tiny bone islands, although these may alternatively represent a unchanged bony metastases. No other definite aggressive appearing lytic or blastic lesions are noted in the visualized portions of the skeleton were on today's examination.  IMPRESSION: 1. Other than several tiny sclerotic bone lesions scattered throughout the visualized axial and appendicular skeleton, which appear stable  in size and appearance compared to the prior study from 2012, there are no definite findings to suggest metastatic disease in the abdomen or pelvis. These bony lesions may simply represent tiny bone islands, or could represent unchanged  sclerotic metastases. Clinical correlation is recommended. 2. 2 mm calcification in the distal third of the right ureter immediately before the right ureterovesicular junction appears nonobstructive. There is also a tiny 2 mm calcification lying dependently in the urinary bladder, and a 3 mm calcification which appears near the proximal prostatic urethra within what appears to be a TURP defect in the prostate gland. 3. Other than mild enlargement and post procedural changes of presumed TURP, the prostate gland is generally unremarkable in appearance. 4. Several small bladder wall diverticulae are noted along the anterior wall the urinary bladder. 5. Large duodenal diverticulae, without findings to suggest acute duodenum diverticulitis at this time.   Electronically Signed   By: Vinnie Langton M.D.   On: 08/31/2013 14:16    ASSESSMENT:  #1. Stage IV prostate cancer with bone metastases, tolerating Casodex well. 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 Margot Chimes in October 2005  #3. diffuse large B-cell lymphoma clinically stage IIIAS STATUS post cervical lymph node biopsy on 08/03/2003. His lymphoma CD20 positive and PET scan showed positivity in the spleen. Bone marrow aspiration and biopsy were negative however. He received R. CHOP x6 cycles with a complete remission established by PET scan criteria on 11/23/2003. He completed 6 full cycles total. He remains without disease recurrence. He remains free of B. ymptomatology.  #4. Atrial fibrillation on warfarin, controlled ventricular response.  #4. Hypertension, controlled.  #6. Basal cell carcinoma, status post recent resection from nose, healing well.  #7. Diabetes mellitus, non-insulin requiring, controlled.  #8. Bladder stones, status post TURP plus cystolitholapoxy, at which time Gleason's 4+3 equals 7 adenocarcinoma of the prostate was  diagnosed    PLAN:  #1. Depo Lupron 22.5 mg intramuscularly today. #2. Continue Casodex 50 mg daily. #3. Followup in 3 months with CBC, chem profile, PSA, and testosterone level.   All questions were answered. The patient knows to call the clinic with any problems, questions or concerns. We can certainly see the patient much sooner if necessary.   I spent 25 minutes counseling the patient face to face. The total time spent in the appointment was 30 minutes.    [name], MD 09/25/2013 3:17 PM  DISCLAIMER:  This note was dictated with voice recognition software.  Similar sounding words can inadvertently be transcribed inaccurately and may not be corrected upon review.

## 2013-09-25 NOTE — Patient Instructions (Signed)
[facility] Discharge Instructions  RECOMMENDATIONS MADE BY THE CONSULTANT AND ANY TEST RESULTS WILL BE SENT TO YOUR REFERRING PHYSICIAN.  EXAM FINDINGS BY THE PHYSICIAN TODAY AND SIGNS OR SYMPTOMS TO REPORT TO CLINIC OR PRIMARY PHYSICIAN: Exam and findings as discussed by Dr. Barnet Glasgow.  Will get lupron injection today.  Report uncontrolled pain or other problems.  MEDICATIONS PRESCRIBED:  None - continue casodex.  INSTRUCTIONS/FOLLOW-UP: Follow-up in 3 months with labs, office visit and injection.  Thank you for choosing [facility] to provide your oncology and hematology care.  To afford each patient quality time with our providers, please arrive at least 15 minutes before your scheduled appointment time.  With your help, our goal is to use those 15 minutes to complete the necessary work-up to ensure our physicians have the information they need to help with your evaluation and healthcare recommendations.    Effective January 1st, 2014, we ask that you re-schedule your appointment with our physicians should you arrive 10 or more minutes late for your appointment.  We strive to give you quality time with our providers, and arriving late affects you and other patients whose appointments are after yours.    Again, thank you for choosing [facility].  Our hope is that these requests will decrease the amount of time that you wait before being seen by our physicians.       _____________________________________________________________  Should you have questions after your visit to [facility], please contact our office at [phone] between the hours of 8:30 a.m. and 5:00 p.m.  Voicemails left after 4:30 p.m. will not be returned until the following business day.  For prescription refill requests, have your pharmacy contact our office with your prescription refill request.

## 2013-09-25 NOTE — Progress Notes (Signed)
[name] presents today for injection per MD orders. Lupron 22.5 mg administered IM Z-trackin left Gluteal. Administration without incident. Patient tolerated well.

## 2013-09-26 DIAGNOSIS — C61 Malignant neoplasm of prostate: Secondary | ICD-10-CM | POA: Diagnosis not present

## 2013-09-26 DIAGNOSIS — I10 Essential (primary) hypertension: Secondary | ICD-10-CM | POA: Diagnosis not present

## 2013-09-26 DIAGNOSIS — E119 Type 2 diabetes mellitus without complications: Secondary | ICD-10-CM | POA: Diagnosis not present

## 2013-10-02 DIAGNOSIS — H2510 Age-related nuclear cataract, unspecified eye: Secondary | ICD-10-CM | POA: Diagnosis not present

## 2013-10-02 DIAGNOSIS — H2589 Other age-related cataract: Secondary | ICD-10-CM | POA: Diagnosis not present

## 2013-10-02 DIAGNOSIS — H269 Unspecified cataract: Secondary | ICD-10-CM | POA: Diagnosis not present

## 2013-10-11 ENCOUNTER — Ambulatory Visit (INDEPENDENT_AMBULATORY_CARE_PROVIDER_SITE_OTHER): Payer: Medicare Other | Admitting: *Deleted

## 2013-10-11 DIAGNOSIS — Z7901 Long term (current) use of anticoagulants: Secondary | ICD-10-CM

## 2013-10-11 DIAGNOSIS — Z5181 Encounter for therapeutic drug level monitoring: Secondary | ICD-10-CM | POA: Diagnosis not present

## 2013-10-11 DIAGNOSIS — I4891 Unspecified atrial fibrillation: Secondary | ICD-10-CM

## 2013-10-11 LAB — POCT INR: INR: 1.6

## 2013-10-25 ENCOUNTER — Ambulatory Visit (INDEPENDENT_AMBULATORY_CARE_PROVIDER_SITE_OTHER): Payer: Medicare Other | Admitting: *Deleted

## 2013-10-25 DIAGNOSIS — Z5181 Encounter for therapeutic drug level monitoring: Secondary | ICD-10-CM | POA: Diagnosis not present

## 2013-10-25 DIAGNOSIS — Z7901 Long term (current) use of anticoagulants: Secondary | ICD-10-CM | POA: Diagnosis not present

## 2013-10-25 DIAGNOSIS — I4891 Unspecified atrial fibrillation: Secondary | ICD-10-CM | POA: Diagnosis not present

## 2013-10-25 LAB — POCT INR: INR: 1.6

## 2013-11-02 DIAGNOSIS — L570 Actinic keratosis: Secondary | ICD-10-CM | POA: Diagnosis not present

## 2013-11-02 DIAGNOSIS — C61 Malignant neoplasm of prostate: Secondary | ICD-10-CM | POA: Diagnosis not present

## 2013-11-02 DIAGNOSIS — C44711 Basal cell carcinoma of skin of unspecified lower limb, including hip: Secondary | ICD-10-CM | POA: Diagnosis not present

## 2013-11-06 ENCOUNTER — Ambulatory Visit (INDEPENDENT_AMBULATORY_CARE_PROVIDER_SITE_OTHER): Payer: Medicare Other | Admitting: *Deleted

## 2013-11-06 ENCOUNTER — Other Ambulatory Visit (HOSPITAL_COMMUNITY): Payer: Medicare Other

## 2013-11-06 DIAGNOSIS — Z5181 Encounter for therapeutic drug level monitoring: Secondary | ICD-10-CM

## 2013-11-06 DIAGNOSIS — Z7901 Long term (current) use of anticoagulants: Secondary | ICD-10-CM | POA: Diagnosis not present

## 2013-11-06 DIAGNOSIS — I4891 Unspecified atrial fibrillation: Secondary | ICD-10-CM

## 2013-11-06 LAB — POCT INR: INR: 2.5

## 2013-11-07 ENCOUNTER — Ambulatory Visit (INDEPENDENT_AMBULATORY_CARE_PROVIDER_SITE_OTHER): Payer: Self-pay | Admitting: Urology

## 2013-11-07 DIAGNOSIS — C61 Malignant neoplasm of prostate: Secondary | ICD-10-CM

## 2013-11-09 ENCOUNTER — Ambulatory Visit (HOSPITAL_COMMUNITY): Payer: Medicare Other

## 2013-11-14 DIAGNOSIS — E1149 Type 2 diabetes mellitus with other diabetic neurological complication: Secondary | ICD-10-CM | POA: Diagnosis not present

## 2013-11-14 DIAGNOSIS — B351 Tinea unguium: Secondary | ICD-10-CM | POA: Diagnosis not present

## 2013-11-20 ENCOUNTER — Ambulatory Visit (INDEPENDENT_AMBULATORY_CARE_PROVIDER_SITE_OTHER): Payer: Medicare Other | Admitting: *Deleted

## 2013-11-20 DIAGNOSIS — I4891 Unspecified atrial fibrillation: Secondary | ICD-10-CM | POA: Diagnosis not present

## 2013-11-20 DIAGNOSIS — Z5181 Encounter for therapeutic drug level monitoring: Secondary | ICD-10-CM

## 2013-11-20 DIAGNOSIS — Z7901 Long term (current) use of anticoagulants: Secondary | ICD-10-CM | POA: Diagnosis not present

## 2013-11-20 LAB — POCT INR: INR: 2.3

## 2013-11-25 ENCOUNTER — Emergency Department (HOSPITAL_COMMUNITY)
Admission: EM | Admit: 2013-11-25 | Discharge: 2013-11-25 | Disposition: A | Discharge status: Home / Self Care | Payer: Medicare Other | Attending: Emergency Medicine | Admitting: Emergency Medicine

## 2013-11-25 ENCOUNTER — Encounter (HOSPITAL_COMMUNITY): Payer: Self-pay | Admitting: Emergency Medicine

## 2013-11-25 DIAGNOSIS — I10 Essential (primary) hypertension: Secondary | ICD-10-CM | POA: Diagnosis not present

## 2013-11-25 DIAGNOSIS — E119 Type 2 diabetes mellitus without complications: Secondary | ICD-10-CM | POA: Diagnosis not present

## 2013-11-25 DIAGNOSIS — I4891 Unspecified atrial fibrillation: Secondary | ICD-10-CM | POA: Diagnosis not present

## 2013-11-25 DIAGNOSIS — Z79899 Other long term (current) drug therapy: Secondary | ICD-10-CM | POA: Insufficient documentation

## 2013-11-25 DIAGNOSIS — Z8719 Personal history of other diseases of the digestive system: Secondary | ICD-10-CM | POA: Diagnosis not present

## 2013-11-25 DIAGNOSIS — Z7901 Long term (current) use of anticoagulants: Secondary | ICD-10-CM | POA: Diagnosis not present

## 2013-11-25 DIAGNOSIS — Z87898 Personal history of other specified conditions: Secondary | ICD-10-CM | POA: Diagnosis not present

## 2013-11-25 DIAGNOSIS — Z8669 Personal history of other diseases of the nervous system and sense organs: Secondary | ICD-10-CM | POA: Insufficient documentation

## 2013-11-25 DIAGNOSIS — Z792 Long term (current) use of antibiotics: Secondary | ICD-10-CM | POA: Diagnosis not present

## 2013-11-25 NOTE — ED Notes (Signed)
Patient took his BP at 11pm and 1130pm and it was high at home.  Patient took an extra dose of BP medication and denies pain.

## 2013-11-25 NOTE — ED Notes (Signed)
Dr.Knapp at bedside  

## 2013-11-25 NOTE — Discharge Instructions (Signed)
Keep a log of your blood pressures. Follow up with Dr Harl Bowie if your blood pressure is remaining high. Return to the ED if you get a headache, chest pain or feel bad.    Managing Your High Blood Pressure Blood pressure is a measurement of how forceful your blood is pressing against the walls of the arteries. Arteries are muscular tubes within the circulatory system. Blood pressure does not stay the same. Blood pressure rises when you are active, excited, or nervous; and it lowers during sleep and relaxation. If the numbers measuring your blood pressure stay above normal most of the time, you are at risk for health problems. High blood pressure (hypertension) is a long-term (chronic) condition in which blood pressure is elevated. A blood pressure reading is recorded as two numbers, such as 120 over 80 (or 120/80). The first, higher number is called the systolic pressure. It is a measure of the pressure in your arteries as the heart beats. The second, lower number is called the diastolic pressure. It is a measure of the pressure in your arteries as the heart relaxes between beats.  Keeping your blood pressure in a normal range is important to your overall health and prevention of health problems, such as heart disease and stroke. When your blood pressure is uncontrolled, your heart has to work harder than normal. High blood pressure is a very common condition in adults because blood pressure tends to rise with age. Men and women are equally likely to have hypertension but at different times in life. Before age 41, men are more likely to have hypertension. After 78 years of age, women are more likely to have it. Hypertension is especially common in African Americans. This condition often has no signs or symptoms. The cause of the condition is usually not known. Your caregiver can help you come up with a plan to keep your blood pressure in a normal, healthy range. BLOOD PRESSURE STAGES Blood pressure is  classified into four stages: normal, prehypertension, stage 1, and stage 2. Your blood pressure reading will be used to determine what type of treatment, if any, is necessary. Appropriate treatment options are tied to these four stages:  Normal  Systolic pressure (mm Hg): below 120.  Diastolic pressure (mm Hg): below 80. Prehypertension  Systolic pressure (mm Hg): 120 to 139.  Diastolic pressure (mm Hg): 80 to 89. Stage1  Systolic pressure (mm Hg): 140 to 159.  Diastolic pressure (mm Hg): 90 to 99. Stage2  Systolic pressure (mm Hg): 160 or above.  Diastolic pressure (mm Hg): 100 or above. RISKS RELATED TO HIGH BLOOD PRESSURE Managing your blood pressure is an important responsibility. Uncontrolled high blood pressure can lead to:  A heart attack.  A stroke.  A weakened blood vessel (aneurysm).  Heart failure.  Kidney damage.  Eye damage.  Metabolic syndrome.  Memory and concentration problems. HOW TO MANAGE YOUR BLOOD PRESSURE Blood pressure can be managed effectively with lifestyle changes and medicines (if needed). Your caregiver will help you come up with a plan to bring your blood pressure within a normal range. Your plan should include the following: Education  Read all information provided by your caregivers about how to control blood pressure.  Educate yourself on the latest guidelines and treatment recommendations. New research is always being done to further define the risks and treatments for high blood pressure. Lifestylechanges  Control your weight.  Avoid smoking.  Stay physically active.  Reduce the amount of salt in your diet.  Reduce stress.  Control any chronic conditions, such as high cholesterol or diabetes.  Reduce your alcohol intake. Medicines  Several medicines (antihypertensive medicines) are available, if needed, to bring blood pressure within a normal range. Communication  Review all the medicines you take with your  caregiver because there may be side effects or interactions.  Talk with your caregiver about your diet, exercise habits, and other lifestyle factors that may be contributing to high blood pressure.  See your caregiver regularly. Your caregiver can help you create and adjust your plan for managing high blood pressure. RECOMMENDATIONS FOR TREATMENT AND FOLLOW-UP  The following recommendations are based on current guidelines for managing high blood pressure in nonpregnant adults. Use these recommendations to identify the proper follow-up period or treatment option based on your blood pressure reading. You can discuss these options with your caregiver.  Systolic pressure of 349 to 179 or diastolic pressure of 80 to 89: Follow up with your caregiver as directed.  Systolic pressure of 150 to 569 or diastolic pressure of 90 to 100: Follow up with your caregiver within 2 months.  Systolic pressure above 794 or diastolic pressure above 801: Follow up with your caregiver within 1 month.  Systolic pressure above 655 or diastolic pressure above 374: Consider antihypertensive therapy; follow up with your caregiver within 1 week.  Systolic pressure above 827 or diastolic pressure above 078: Begin antihypertensive therapy; follow up with your caregiver within 1 week. Document Released: 12/30/2011 Document Reviewed: 12/30/2011 [facility] Patient Information 2015 [place]. This information is not intended to replace advice given to you by your health care provider. Make sure you discuss any questions you have with your health care provider.

## 2013-11-25 NOTE — ED Provider Notes (Addendum)
CSN: [id number]     Arrival date & time 11/25/13  0017 History  This chart was scribed for Janice Norrie, MD by Steva Colder, ED Scribe. The patient was seen in room APA09/APA09 at 12:36 AM.    Chief Complaint  Patient presents with  . Hypertension     The history is provided by the patient and the spouse. No language interpreter was used.   [name] is a 80 y.o. male with a medical hx of HTN and DM who was brought in by parents to the ED complaining of hypertension onset today. He states that he checked his BP tonight because his wife checked hers this morning and the BP cuff was beside him on the table. He states that he was not feeling bad. He states that 207/136 was the highest BP that was recorded at home. He denies any pain. He states that he normally checks his BP at home. He states that he checked it last week and it was normal. He states that 140/80 is his baseline BP.  He states that he takes one BP medication in the morning at 9 AM. He states that he takes the other BP medication at 10:30 PM. He states that he took an extra dose of the Diltiazem today at 9 pm. He normally takes it in the morning.  He states that he did not eat anything different today or yesterday except he states that he had some watermelon with salt on it around 6 PM. He states that he always puts salt on his foods. His wife made stew today with frozen vegetables and a can on cream of tomato soup in it. He denies HA, CP, SOB, leg swelling, visual disturbance, blurred vision, dizziness, numbness and any other associated symptoms.  He states he has been feeling well and that he was riding a bicycle this morning. He states that he now has bone CA from his prostate cancer. He has started a new medication bicalutamide, his last dose was about 3 months ago.  He states that Dr. Harl Bowie is his Cardiologist.  PCP- Rosita Fire, MD  Past Medical History  Diagnosis Date  . Hypertension   . Atrial fibrillation     Dr. Harl Bowie-  [facility] follows saw 11'14  . 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 Margot Chimes, thus far without recurrence.   . NHL (non-Hodgkin's lymphoma) 08/18/2011    Diffuse large B-cell lymphoma, clinically stage IIIA, CD20 positive, status post cervical lymph node biopsy 08/03/2003 on the left. PET scan was also positive in the spleen and small bowel region, but bone marrow aspiration and biopsy were negative. So he essentially had stage IIIAs. He received R-CHOP x6 cycles with CR established by PET scan criteria on 11/23/2003 with no evidence for relapse th  . Cataracts, both eyes     surgery planned May 2015  . Bladder stones     tx. with oral meds and antibiotics, now surgery planned  . Diabetes mellitus without complication    Past Surgical History  Procedure Laterality Date  . Cholecystectomy    . Incision and drainage perirectal abscess    . Portacath placement      insertion and removal -last chemotherapy 10 yrs ago  . Lymph node dissection Left     '  05  . Port-a-cath removal    . Transurethral resection of prostate N/A 08/10/2013    Procedure: TRANSURETHRAL RESECTION OF THE PROSTATE WITH GYRUS INSTRUMENTS;  Surgeon: [name], MD;  Location: [facility];  Service: Urology;  Laterality: N/A;  . Cystoscopy with litholapaxy N/A 08/10/2013    Procedure: CYSTOSCOPY WITH LITHOLAPAXY WITH [name];  Surgeon: [name], MD;  Location: [facility];  Service: Urology;  Laterality: N/A;  . Cataract extraction Right 08/2013   No family history on file. History  Substance Use Topics  . Smoking status: Never Smoker   . Smokeless tobacco: Never Used  . Alcohol Use: No  lives at home Lives with spouse  Review of Systems  Eyes: Negative for visual  disturbance.       No blurred vision  Respiratory: Negative for shortness of breath.   Cardiovascular: Negative for chest pain and leg swelling.  Neurological: Negative for dizziness and headaches.  All other systems reviewed and are negative.     Allergies  Review of patient's allergies indicates no known allergies.  Home Medications   Prior to Admission medications   Medication Sig Start Date End Date Taking? Authorizing Provider  acetaminophen (TYLENOL) 500 MG tablet Take 500 mg by mouth every 6 (six) hours as needed for mild pain.     Historical Provider, MD  amitriptyline (ELAVIL) 25 MG tablet Take 25 mg by mouth at bedtime.    Historical Provider, MD  Azelastine HCl (ASTEPRO) 0.15 % SOLN Place 2 sprays into the nose 2 (two) times daily as needed.    Historical Provider, MD  bicalutamide (CASODEX) 50 MG tablet Take 1 tablet (50 mg total) by mouth daily. 09/08/13   [name], MD  Cranberry 200 MG CAPS Take 2 capsules by mouth daily.     Historical Provider, MD  diltiazem (DILACOR XR) 180 MG 24 hr capsule Take 180 mg by mouth every evening.    Historical Provider, MD  Docusate Calcium (STOOL SOFTENER PO) Take 1 tablet by mouth daily.    Historical Provider, MD  gentamicin (GARAMYCIN) 0.3 % ophthalmic solution 1 drop every morning. As directed 08/29/13   Historical Provider, MD  linagliptin (TRADJENTA) 5 MG TABS tablet Take 5 mg by mouth at bedtime.     Historical Provider, MD  lisinopril-hydrochlorothiazide (PRINZIDE,ZESTORETIC) 20-12.5 MG per tablet Take 1 tablet by mouth every morning.    Historical Provider, MD  omeprazole (PRILOSEC) 20 MG capsule Take 20 mg by mouth daily.     Historical Provider, MD  potassium chloride (K-DUR,KLOR-CON) 10 MEQ tablet Take 10 mEq by mouth daily. 09/20/12   Lendon Colonel, NP  SENNA PO Take 1 capsule by mouth as needed.    Historical Provider, MD  warfarin (COUMADIN) 2.5 MG tablet Take 1 tablet daily except 2 tablets on Mondays and Fridays  08/31/13   Arnoldo Lenis, MD   BP 212/106  Pulse 89  Temp(Src) 99.6 F (37.6 C) (Oral)  Resp 18  Ht 5' 10.5" (1.791 m)  Wt 185 lb (83.915 kg)  BMI 26.16 kg/m2  SpO2 98%  Vital signs normal except hypertension   Physical Exam  Nursing note and vitals reviewed. Constitutional: He is oriented to person, place, and time. He appears well-developed and well-nourished.  Non-toxic appearance. He does not appear ill. No distress.  HENT:  Head: Normocephalic and atraumatic.  Right Ear: External ear normal.  Left Ear: External ear normal.  Nose: Nose normal. No mucosal edema or rhinorrhea.  Mouth/Throat: Oropharynx is  clear and moist and mucous membranes are normal. No dental abscesses or uvula swelling.  Eyes: Conjunctivae and EOM are normal. Pupils are equal, round, and reactive to light.  Neck: Normal range of motion and full passive range of motion without pain. Neck supple.  Cardiovascular: Normal rate and normal heart sounds.  An irregular rhythm present. Exam reveals no gallop and no friction rub.   No murmur heard. Pulmonary/Chest: Effort normal and breath sounds normal. No respiratory distress. He has no wheezes. He has no rhonchi. He has no rales. He exhibits no tenderness and no crepitus.  Abdominal: Soft. Normal appearance and bowel sounds are normal. He exhibits no distension. There is no tenderness. There is no rebound and no guarding.  Musculoskeletal: Normal range of motion. He exhibits no edema and no tenderness.  Moves all extremities well.   Neurological: He is alert and oriented to person, place, and time. He has normal strength. No cranial nerve deficit.  Skin: Skin is warm, dry and intact. No rash noted. No erythema. No pallor.  Psychiatric: He has a normal mood and affect. His speech is normal and behavior is normal. His mood appears not anxious.    ED Course  Procedures (including critical care time)  Medications - No data to display  DIAGNOSTIC  STUDIES: Oxygen Saturation is 98% on room air, normal by my interpretation.    COORDINATION OF CARE: 12:44 AM-Discussed treatment plan with pt and family at bedside and pt and family agreed to plan.    Patient was observed because he are taking an extra dose if his blood pressure medicine at home. His blood pressure improved from 212/106 in triage to 197/96 during my interview, in the 1:35 AM his blood pressure was 184/91  02:00 BP 170/89, patient is ready to be discharged. We discussed keeping a BP log and following up with Dr Harl Bowie, his cardiologist. He is to take his medications as normal tomorrow.   No results found.   MDM   patient presents with asymptomatic hypertension after eating some excess salt today. Patient had taken an extra dose of his usual morning blood pressure medication.    Final diagnoses:  Essential hypertension    Plan discharge   Rolland Porter, MD, FACEP   I personally performed the services described in this documentation, which was scribed in my presence. The recorded information has been reviewed and considered.  Vital signs normal     Janice Norrie, MD 11/25/13 [id number]  Janice Norrie, MD 11/25/13 [phone]

## 2013-11-25 NOTE — ED Notes (Signed)
Discharge instructions given and reviewed with patient and wife.  Patient verbalized understanding to keep a log of his BP and to follow up with Dr. Harl Bowie if it continues to read high.  Patient also instructed and verbalized understanding not to take additional BP medication.  Patient denies pain at this time.  Patient ambulatory; discharged home in good condition.

## 2013-11-25 NOTE — ED Notes (Signed)
Pt. Reports he took blood pressure and home and it was high. Pt. Denies chest pain, denies headache.

## 2013-11-29 ENCOUNTER — Encounter: Payer: Self-pay | Admitting: Physician Assistant

## 2013-11-29 ENCOUNTER — Ambulatory Visit (INDEPENDENT_AMBULATORY_CARE_PROVIDER_SITE_OTHER): Payer: Medicare Other | Admitting: Physician Assistant

## 2013-11-29 VITALS — BP 142/82 | HR 77 | Ht 70.0 in | Wt 187.0 lb

## 2013-11-29 DIAGNOSIS — I4891 Unspecified atrial fibrillation: Secondary | ICD-10-CM

## 2013-11-29 DIAGNOSIS — I482 Chronic atrial fibrillation, unspecified: Secondary | ICD-10-CM

## 2013-11-29 DIAGNOSIS — I10 Essential (primary) hypertension: Secondary | ICD-10-CM

## 2013-11-29 MED ORDER — LISINOPRIL-HYDROCHLOROTHIAZIDE 20-25 MG PO TABS: 1.0000 | ORAL_TABLET | Freq: Every day | ORAL | Status: DC

## 2013-11-29 NOTE — Assessment & Plan Note (Signed)
Atrial fib rate controlled on Cardizem and Coumadin

## 2013-11-29 NOTE — Patient Instructions (Addendum)
Your physician recommends that you schedule a follow-up appointment in: 1 month with Dr.Branch  Please follow 2 gram sodium diet I have given you   Please INCREASE Lisinopril/HCTZ to 20-25 mg daily      Thank you for choosing [facility] !

## 2013-11-29 NOTE — Assessment & Plan Note (Signed)
Blood pressure remains elevated but has started to come down since he stopped Myrbetriq on Friday. I suspect this was a reason it went so high and sent him to the emergency room. I've instructed him on a 2 g sodium diet and will increase his lisinopril HCTZ to 20/25 mg daily. He will continue to take his blood pressures. If it remains elevated he is to call and and may need another agent. Otherwise followup with Dr.Branch in one month.

## 2013-11-29 NOTE — Progress Notes (Signed)
HPI: This is an 80 year old male patient [name] with history of atrial fibrillation rate controlled on Coumadin and also hypertension and diabetes mellitus.  He was recently seen in the emergency room with hypertension and a blood pressure of 212/106. His blood pressure eventually came down and he was told to keep track of it and come see [place]. He realized he was taking Myrbetriq for his prostate and it was recently increased to 50 mg daily. This can cause significant hypertension. He stopped this Friday. He also admits TEE a lot of watermelon and he puts a lot of salt on his watermelon. He eats ham every day as well. His blood pressure readings from home have been in the 417-408 systolic range.  He denies any chest pain, palpitations, dyspnea, dyspnea on exertion, dizziness or presyncope  No Known Allergies   Current Outpatient Prescriptions  Medication Sig Dispense Refill  . acetaminophen (TYLENOL) 500 MG tablet Take 500 mg by mouth every 6 (six) hours as needed for mild pain.       Marland Kitchen amitriptyline (ELAVIL) 25 MG tablet Take 25 mg by mouth at bedtime.      . Azelastine HCl (ASTEPRO) 0.15 % SOLN Place 2 sprays into the nose 2 (two) times daily as needed.      . bicalutamide (CASODEX) 50 MG tablet Take 1 tablet (50 mg total) by mouth daily.  90 tablet  3  . Cranberry 200 MG CAPS Take 4,200 mg by mouth every morning.       . diltiazem (DILACOR XR) 180 MG 24 hr capsule Take 180 mg by mouth every evening.      Mariane Baumgarten Calcium (STOOL SOFTENER PO) Take 1 tablet by mouth 2 (two) times daily.       Marland Kitchen gentamicin (GARAMYCIN) 0.3 % ophthalmic solution 1 drop every morning. As directed      . glipiZIDE (GLUCOTROL) 5 MG tablet Take by mouth daily before breakfast.      . linagliptin (TRADJENTA) 5 MG TABS tablet Take 5 mg by mouth at bedtime.       Marland Kitchen lisinopril-hydrochlorothiazide (PRINZIDE,ZESTORETIC) 20-12.5 MG per tablet Take 1 tablet by mouth every morning.      . mirabegron ER  (MYRBETRIQ) 50 MG TB24 tablet Take 50 mg by mouth daily.      Marland Kitchen omeprazole (PRILOSEC) 20 MG capsule Take 20 mg by mouth daily.       . potassium chloride (K-DUR,KLOR-CON) 10 MEQ tablet Take 10 mEq by mouth daily.      . SENNA PO Take 1 capsule by mouth as needed.      . warfarin (COUMADIN) 2.5 MG tablet Take 1 tablet daily except 2 tablets on Mondays and Fridays  130 tablet  3   No current facility-administered medications for this visit.    Past Medical History  Diagnosis Date  . Hypertension   . Atrial fibrillation     [name]- [facility] follows saw 11'14  . 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 Margot Chimes, thus far without recurrence.   . NHL (non-Hodgkin's lymphoma) 08/18/2011    Diffuse large B-cell lymphoma, clinically stage IIIA, CD20 positive, status post cervical lymph node biopsy 08/03/2003 on the left. PET scan was also positive in the spleen and small  bowel region, but bone marrow aspiration and biopsy were negative. So he essentially had stage IIIAs. He received R-CHOP x6 cycles with CR established by PET scan criteria on 11/23/2003 with no evidence for relapse th  . Cataracts, both eyes     surgery planned May 2015  . Bladder stones     tx. with oral meds and antibiotics, now surgery planned  . Diabetes mellitus without complication     Past Surgical History  Procedure Laterality Date  . Cholecystectomy    . Incision and drainage perirectal abscess    . Portacath placement      insertion and removal -last chemotherapy 10 yrs ago  . Lymph node dissection Left     '05  . Port-a-cath removal    . Transurethral resection of prostate N/A 08/10/2013    Procedure: TRANSURETHRAL RESECTION OF THE PROSTATE WITH GYRUS INSTRUMENTS;  Surgeon:  [name], MD;  Location: [facility];  Service: Urology;  Laterality: N/A;  . Cystoscopy with litholapaxy N/A 08/10/2013    Procedure: CYSTOSCOPY WITH LITHOLAPAXY WITH [name];  Surgeon: [name], MD;  Location: [facility];  Service: Urology;  Laterality: N/A;  . Cataract extraction Right 08/2013    No family history on file.  History   Social History  . Marital Status: Married    Spouse Name: N/A    Number of Children: N/A  . Years of Education: N/A   Occupational History  . retired     Hotel manager   Social History Main Topics  . Smoking status: Never Smoker   . Smokeless tobacco: Never Used  . Alcohol Use: No  . Drug Use: No  . Sexual Activity: Not Currently   Other Topics Concern  . Not on file   Social History Narrative  . No narrative on file    ROS: See history of present illness otherwise negative  BP 142/82  Pulse 77  Ht 5\' 10"  (1.778 m)  Wt 187 lb (84.823 kg)  BMI 26.83 kg/m2  SpO2 99%  PHYSICAL EXAM: Well-nournished, in no acute distress. Neck: No JVD, HJR, Bruit, or thyroid enlargement  Lungs: No tachypnea, clear without wheezing, rales, or rhonchi  Cardiovascular: Irregular irregular, PMI not displaced, heart sounds distant, no murmurs, gallops, bruit, thrill, or heave.  Abdomen: BS normal. Soft without organomegaly, masses, lesions or tenderness.  Extremities: without cyanosis, clubbing or edema. Good distal pulses bilateral  SKin: Warm, no lesions or rashes   Musculoskeletal: No deformities  Neuro: no focal signs   Wt Readings from Last 3 Encounters:  11/29/13 187 lb (84.823 kg)  11/25/13 185 lb (83.915 kg)  09/25/13 189 lb 3.2 oz (85.821 kg)     EKG: Atrial fibrillation at 83 beats per minute with incomplete right bundle branch block and nonspecific ST-T wave changes

## 2013-12-04 DIAGNOSIS — Z85828 Personal history of other malignant neoplasm of skin: Secondary | ICD-10-CM | POA: Diagnosis not present

## 2013-12-15 ENCOUNTER — Encounter (HOSPITAL_COMMUNITY): Payer: Medicare Other | Attending: Hematology and Oncology

## 2013-12-15 DIAGNOSIS — C61 Malignant neoplasm of prostate: Secondary | ICD-10-CM | POA: Insufficient documentation

## 2013-12-15 DIAGNOSIS — C7952 Secondary malignant neoplasm of bone marrow: Secondary | ICD-10-CM

## 2013-12-15 DIAGNOSIS — C7951 Secondary malignant neoplasm of bone: Secondary | ICD-10-CM

## 2013-12-15 LAB — COMPREHENSIVE METABOLIC PANEL
ALT: 25 U/L (ref 0–53)
AST: 25 U/L (ref 0–37)
Albumin: 4.3 g/dL (ref 3.5–5.2)
Alkaline Phosphatase: 119 U/L — ABNORMAL HIGH (ref 39–117)
Anion gap: 12 (ref 5–15)
BUN: 22 mg/dL (ref 6–23)
CO2: 31 mEq/L (ref 19–32)
Calcium: 10.2 mg/dL (ref 8.4–10.5)
Chloride: 101 mEq/L (ref 96–112)
Creatinine, Ser: 0.99 mg/dL (ref 0.50–1.35)
GFR calc Af Amer: 83 mL/min — ABNORMAL LOW (ref >90)
GFR calc non Af Amer: 71 mL/min — ABNORMAL LOW (ref >90)
Glucose, Bld: 163 mg/dL — ABNORMAL HIGH (ref 70–99)
Potassium: 3.9 mEq/L (ref 3.7–5.3)
Sodium: 144 mEq/L (ref 137–147)
Total Bilirubin: 0.8 mg/dL (ref 0.3–1.2)
Total Protein: 7.9 g/dL (ref 6.0–8.3)

## 2013-12-15 LAB — CBC WITH DIFFERENTIAL/PLATELET
Basophils Absolute: 0 10*3/uL (ref 0.0–0.1)
Basophils Relative: 0 % (ref 0–1)
Eosinophils Absolute: 0.1 10*3/uL (ref 0.0–0.7)
Eosinophils Relative: 2 % (ref 0–5)
HCT: 45.1 % (ref 39.0–52.0)
Hemoglobin: 16.3 g/dL (ref 13.0–17.0)
Lymphocytes Relative: 37 % (ref 12–46)
Lymphs Abs: 2.6 10*3/uL (ref 0.7–4.0)
MCH: 31.2 pg (ref 26.0–34.0)
MCHC: 36.1 g/dL — ABNORMAL HIGH (ref 30.0–36.0)
MCV: 86.4 fL (ref 78.0–100.0)
Monocytes Absolute: 0.6 10*3/uL (ref 0.1–1.0)
Monocytes Relative: 8 % (ref 3–12)
Neutro Abs: 3.6 10*3/uL (ref 1.7–7.7)
Neutrophils Relative %: 53 % (ref 43–77)
Platelets: 169 10*3/uL (ref 150–400)
RBC: 5.22 MIL/uL (ref 4.22–5.81)
RDW: 13.3 % (ref 11.5–15.5)
WBC: 6.9 10*3/uL (ref 4.0–10.5)

## 2013-12-15 LAB — TESTOSTERONE: Testosterone: 25 ng/dL — ABNORMAL LOW (ref 300–890)

## 2013-12-16 LAB — PSA: PSA: 0.52 ng/mL (ref <=4.00)

## 2013-12-18 ENCOUNTER — Encounter (HOSPITAL_BASED_OUTPATIENT_CLINIC_OR_DEPARTMENT_OTHER): Payer: Medicare Other

## 2013-12-18 ENCOUNTER — Ambulatory Visit (INDEPENDENT_AMBULATORY_CARE_PROVIDER_SITE_OTHER): Payer: Medicare Other | Admitting: *Deleted

## 2013-12-18 ENCOUNTER — Other Ambulatory Visit (HOSPITAL_COMMUNITY): Payer: Self-pay | Admitting: Hematology and Oncology

## 2013-12-18 ENCOUNTER — Encounter (HOSPITAL_COMMUNITY): Payer: Self-pay

## 2013-12-18 ENCOUNTER — Encounter (INDEPENDENT_AMBULATORY_CARE_PROVIDER_SITE_OTHER): Payer: Medicare Other

## 2013-12-18 VITALS — BP 131/80 | HR 96 | Temp 97.7°F | Resp 18 | Wt 185.2 lb

## 2013-12-18 DIAGNOSIS — C61 Malignant neoplasm of prostate: Secondary | ICD-10-CM

## 2013-12-18 DIAGNOSIS — C8589 Other specified types of non-Hodgkin lymphoma, extranodal and solid organ sites: Secondary | ICD-10-CM | POA: Diagnosis not present

## 2013-12-18 DIAGNOSIS — Z7901 Long term (current) use of anticoagulants: Secondary | ICD-10-CM | POA: Diagnosis not present

## 2013-12-18 DIAGNOSIS — C8590 Non-Hodgkin lymphoma, unspecified, unspecified site: Secondary | ICD-10-CM

## 2013-12-18 DIAGNOSIS — Z5181 Encounter for therapeutic drug level monitoring: Secondary | ICD-10-CM

## 2013-12-18 DIAGNOSIS — I10 Essential (primary) hypertension: Secondary | ICD-10-CM

## 2013-12-18 DIAGNOSIS — Z5111 Encounter for antineoplastic chemotherapy: Secondary | ICD-10-CM

## 2013-12-18 DIAGNOSIS — I4891 Unspecified atrial fibrillation: Secondary | ICD-10-CM | POA: Diagnosis not present

## 2013-12-18 DIAGNOSIS — C179 Malignant neoplasm of small intestine, unspecified: Secondary | ICD-10-CM | POA: Diagnosis not present

## 2013-12-18 DIAGNOSIS — C49A0 Gastrointestinal stromal tumor, unspecified site: Secondary | ICD-10-CM

## 2013-12-18 DIAGNOSIS — C7952 Secondary malignant neoplasm of bone marrow: Secondary | ICD-10-CM

## 2013-12-18 DIAGNOSIS — C7951 Secondary malignant neoplasm of bone: Secondary | ICD-10-CM

## 2013-12-18 DIAGNOSIS — Z23 Encounter for immunization: Secondary | ICD-10-CM | POA: Diagnosis not present

## 2013-12-18 DIAGNOSIS — C44319 Basal cell carcinoma of skin of other parts of face: Secondary | ICD-10-CM

## 2013-12-18 DIAGNOSIS — IMO0001 Reserved for inherently not codable concepts without codable children: Secondary | ICD-10-CM | POA: Diagnosis not present

## 2013-12-18 DIAGNOSIS — C8588 Other specified types of non-Hodgkin lymphoma, lymph nodes of multiple sites: Secondary | ICD-10-CM

## 2013-12-18 LAB — POCT INR: INR: 3

## 2013-12-18 MED ORDER — LEUPROLIDE ACETATE (3 MONTH) 22.5 MG IM KIT
22.5000 mg | PACK | Freq: Once | INTRAMUSCULAR | Status: AC
  Administered 2013-12-18: 22.5 mg via INTRAMUSCULAR
  Filled 2013-12-18: qty 22.5

## 2013-12-18 NOTE — Patient Instructions (Signed)
[facility] Discharge Instructions  RECOMMENDATIONS MADE BY THE CONSULTANT AND ANY TEST RESULTS WILL BE SENT TO YOUR REFERRING PHYSICIAN.  We will see you in 3 months for repeat labs and injection.  Please call for any questions or concerns.  Thank you for choosing [facility] to provide your oncology and hematology care.  To afford each patient quality time with our providers, please arrive at least 15 minutes before your scheduled appointment time.  With your help, our goal is to use those 15 minutes to complete the necessary work-up to ensure our physicians have the information they need to help with your evaluation and healthcare recommendations.    Effective January 1st, 2014, we ask that you re-schedule your appointment with our physicians should you arrive 10 or more minutes late for your appointment.  We strive to give you quality time with our providers, and arriving late affects you and other patients whose appointments are after yours.    Again, thank you for choosing [facility].  Our hope is that these requests will decrease the amount of time that you wait before being seen by our physicians.       _____________________________________________________________  Should you have questions after your visit to [facility], please contact our office at [phone] between the hours of 8:30 a.m. and 4:30 p.m.  Voicemails left after 4:30 p.m. will not be returned until the following business day.  For prescription refill requests, have your pharmacy contact our office with your prescription refill request.    _______________________________________________________________  We hope that we have given you very good care.  You may receive a patient satisfaction survey in the mail, please complete it and return it as soon as possible.  We value your feedback!  _______________________________________________________________  Have  you asked about our STAR program?  STAR stands for Survivorship Training and Rehabilitation, and this is a nationally recognized cancer care program that focuses on survivorship and rehabilitation.  Cancer and cancer treatments may cause problems, such as, pain, making you feel tired and keeping you from doing the things that you need or want to do. Cancer rehabilitation can help. Our goal is to reduce these troubling effects and help you have the best quality of life possible.  You may receive a survey from a nurse that asks questions about your current state of health.  Based on the survey results, all eligible patients will be referred to the [facility] program for an evaluation so we can better serve you!  A frequently asked questions sheet is available upon request.

## 2013-12-18 NOTE — Progress Notes (Signed)
[name] presents today for injection per the provider's orders.  Lupron administration without incident; see MAR for injection details.  Patient tolerated procedure well and without incident.  No questions or complaints noted at this time.

## 2013-12-18 NOTE — Progress Notes (Signed)
     [facility]  OFFICE PROGRESS NOTE  [name],[name], MD [address] [place] [address]  DIAGNOSIS: Prostate cancer  Bone metastases  GIST (gastrointestinal stromal tumor), malignant  NHL (non-Hodgkin's lymphoma)  Chief Complaint  Patient presents with  . Prostate cancer with bone metastases  . GIST of small intestine  . Diffuse large B-cell lymphoma    CURRENT THERAPY: Casodex 50 mg daily started 09/08/2013. Depo Lupron 22.5 mg intramuscularly every 3 months, first dose on 09/25/2013.  INTERVAL HISTORY: [name] 80 y.o. male returns for followup and continuation of LHRH agonist therapy for stage IV prostate cancer with bone metastases, recently diagnosed at [facility]UR[facility] revealing a Gleason's 4+3 = 7 with 70% of the tissue removed involved. PSA done on 09/08/2013 was 44.21 with a serum testosterone level of 203. He has 3 or 4 flashes per day including during the night which awakens him. He was started on Miragegron by his urologist with resultant rise in blood pressure to over 200 systolic. He was seen in the emergency room and the drug was discontinued. He had achieved relief of frequent urination via detrusor relaxation but apparently the beta 3 agonist activity of the drug resulted in significant increase in his blood pressure. He denies a diarrhea, dysuria, hematuria, melena, hematochezia, cough, wheezing, PND, orthopnea, palpitations, skin rash, lower extremity swelling, headache, or seizures.   MEDICAL HISTORY: Past Medical History  Diagnosis Date  . Hypertension   . Atrial fibrillation     Dr. Branch- [facility] follows saw 11'14  . 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 Streck, thus far  without recurrence.   . NHL (non-Hodgkin's lymphoma) 08/18/2011    Diffuse large B-cell lymphoma, clinically stage IIIA, CD20 positive, status post cervical lymph node biopsy 08/03/2003 on the left. PET scan was also positive in the spleen and small bowel region, but bone marrow aspiration and biopsy were negative. So he essentially had stage IIIAs. He received R-CHOP x6 cycles with CR established by PET scan criteria on 11/23/2003 with no evidence for relapse th  . Cataracts, both eyes     surgery planned May 2015  . Bladder stones     tx. with oral meds and antibiotics, now surgery planned  . Diabetes mellitus without complication     INTERIM HISTORY: has HYPERTENSION, UNSPECIFIED; ATRIAL FIBRILLATION; Encounter for long-term (current) use of anticoagulants; NHL (non-Hodgkin's lymphoma); GIST (gastrointestinal stromal tumor), malignant; Encounter for therapeutic drug monitoring; Hypertrophy of prostate with urinary obstruction and other lower urinary tract symptoms (LUTS); and Prostate cancer on his problem list.   #1 diffuse large B-cell lymphoma clinically stage IIIAS STATUS post cervical lymph node biopsy on 08/03/2003. His lymphoma CD20 positive and PET scan showed positivity in the spleen. Bone marrow aspiration and biopsy were negative however. He received R. CHOP x6 cycles with a complete remission established by PET scan criteria on 11/23/2003. He completed 6 full cycles total. He remains without disease recurrence. He remains free of B. Symptomatology.  #2.GIST of the small bowel, 4.5 cm in size, with intermediate prognostic grade, found on his PET scan in the small bowel status post resection by Dr. Streck in October 2005 #3. TURP with prostate cancer 08/10/2013, Gleason score 4+3 = 7,    with bone metastases started on Casodex 50 mg daily on 09/08/2013 followed by Depo Lupron 22.5 mg intramuscularly on 09/25/2013.  ALLERGIES:  has No Known Allergies.  MEDICATIONS: has a current medication list  which includes the following prescription(s): acetaminophen, amitriptyline, azelastine hcl, bicalutamide, cranberry, diltiazem, docusate calcium, glipizide, linagliptin, lisinopril-hydrochlorothiazide, omeprazole, potassium chloride, senna, warfarin, gentamicin, and mirabegron er, and the following Facility-Administered Medications: leuprolide.  SURGICAL HISTORY:  Past Surgical History  Procedure Laterality Date  . Cholecystectomy    . Incision and drainage perirectal abscess    . Portacath placement      insertion and removal -last chemotherapy 10 yrs ago  . Lymph node dissection Left     '05  . Port-a-cath removal    . Transurethral resection of prostate N/A 08/10/2013    Procedure: TRANSURETHRAL RESECTION OF THE PROSTATE WITH GYRUS INSTRUMENTS;  Surgeon: Stephen Dahlstedt, MD;  Location: [facility];  Service: Urology;  Laterality: N/A;  . Cystoscopy with litholapaxy N/A 08/10/2013    Procedure: CYSTOSCOPY WITH LITHOLAPAXY WITH STONEBREAKER;  Surgeon: Stephen Dahlstedt, MD;  Location: [facility];  Service: Urology;  Laterality: N/A;  . Cataract extraction Right 08/2013    FAMILY HISTORY: family history is not on file.  SOCIAL HISTORY:  reports that he has never smoked. He has never used smokeless tobacco. He reports that he does not drink alcohol or use illicit drugs.  REVIEW OF SYSTEMS:  Other than that discussed above is noncontributory.  PHYSICAL EXAMINATION: ECOG PERFORMANCE STATUS: 1 - Symptomatic but completely ambulatory  Blood pressure 131/80, pulse 96, temperature 97.7 F (36.5 C), temperature source Oral, resp. rate 18, weight 185 lb 3.2 oz (84.006 kg), SpO2 100.00%.  GENERAL:alert, no distress and comfortable SKIN: skin color, texture, turgor are normal, no rashes or significant lesions EYES: PERLA; Conjunctiva are pink and non-injected, sclera clear SINUSES: No redness or tenderness over maxillary or ethmoid sinuses OROPHARYNX:no exudate, no erythema on lips, buccal mucosa, or  tongue. NECK: supple, thyroid normal size, non-tender, without nodularity. No masses CHEST: Bilateral gynecomastia with no tenderness. LYMPH:  no palpable lymphadenopathy in the cervical, axillary or inguinal LUNGS: clear to auscultation and percussion with normal breathing effort HEART: Irregularly irregular with no S3. ABDOMEN:abdomen soft, non-tender and normal bowel sounds MUSCULOSKELETAL:no cyanosis of digits and no clubbing. Range of motion normal.  NEURO: alert & oriented x 3 with fluent speech, no focal motor/sensory deficits   LABORATORY DATA:  Results for [name], [name] (MRN [id number]) as of 12/18/2013 11:31  Ref. Range 09/08/2013 10:47 12/15/2013 08:37  PSA Latest Range: <=4.00 ng/mL 44.21 (H) 0.52   Results for [name], [name] (MRN [id number]) as of 12/18/2013 11:31  Ref. Range 09/08/2013 10:47 12/15/2013 08:37  Testosterone Latest Range: 300-890 ng/dL 203 (L) 25 (L)     Appointment on 12/15/2013  Component Date Value Ref Range Status  . WBC 12/15/2013 6.9  4.0 - 10.5 K/uL Final  . RBC 12/15/2013 5.22  4.22 - 5.81 MIL/uL Final  . Hemoglobin 12/15/2013 16.3  13.0 - 17.0 g/dL Final  . HCT 12/15/2013 45.1  39.0 - 52.0 % Final  . MCV 12/15/2013 86.4  78.0 - 100.0 fL Final  . MCH 12/15/2013 31.2  26.0 - 34.0 pg Final  . MCHC 12/15/2013 36.1* 30.0 - 36.0 g/dL Final  . RDW 12/15/2013 13.3  11.5 - 15.5 % Final  . Platelets 12/15/2013 169  150 - 400 K/uL Final  . Neutrophils Relative % 12/15/2013 53  43 - 77 % Final  . Neutro   Abs 12/15/2013 3.6  1.7 - 7.7 K/uL Final  . Lymphocytes Relative 12/15/2013 37  12 - 46 % Final  . Lymphs Abs 12/15/2013 2.6  0.7 - 4.0 K/uL Final  . Monocytes Relative 12/15/2013 8  3 - 12 % Final  . Monocytes Absolute 12/15/2013 0.6  0.1 - 1.0 K/uL Final  . Eosinophils Relative 12/15/2013 2  0 - 5 % Final  . Eosinophils Absolute 12/15/2013 0.1  0.0 - 0.7 K/uL Final  . Basophils Relative 12/15/2013 0  0 - 1 % Final  . Basophils Absolute 12/15/2013 0.0   0.0 - 0.1 K/uL Final  . Sodium 12/15/2013 144  137 - 147 mEq/L Final  . Potassium 12/15/2013 3.9  3.7 - 5.3 mEq/L Final  . Chloride 12/15/2013 101  96 - 112 mEq/L Final  . CO2 12/15/2013 31  19 - 32 mEq/L Final  . Glucose, Bld 12/15/2013 163* 70 - 99 mg/dL Final  . BUN 12/15/2013 22  6 - 23 mg/dL Final  . Creatinine, Ser 12/15/2013 0.99  0.50 - 1.35 mg/dL Final  . Calcium 12/15/2013 10.2  8.4 - 10.5 mg/dL Final  . Total Protein 12/15/2013 7.9  6.0 - 8.3 g/dL Final  . Albumin 12/15/2013 4.3  3.5 - 5.2 g/dL Final  . AST 12/15/2013 25  0 - 37 U/L Final  . ALT 12/15/2013 25  0 - 53 U/L Final  . Alkaline Phosphatase 12/15/2013 119* 39 - 117 U/L Final  . Total Bilirubin 12/15/2013 0.8  0.3 - 1.2 mg/dL Final  . GFR calc non Af Amer 12/15/2013 71* >90 mL/min Final  . GFR calc Af Amer 12/15/2013 83* >90 mL/min Final   Comment: (NOTE)                          The eGFR has been calculated using the CKD EPI equation.                          This calculation has not been validated in all clinical situations.                          eGFR's persistently <90 mL/min signify possible Chronic Kidney                          Disease.  . Anion gap 12/15/2013 12  5 - 15 Final  . Testosterone 12/15/2013 25* 300 - 890 ng/dL Final   Comment: (NOTE)                                   [facility]anner Stage       Male              Male                                       I              < 30 ng/dL        < 10 ng/dL                                         II             < 150 ng/dL       < 30 ng/dL                                       III            100-320 ng/dL     < 35 ng/dL                                       IV             200-970 ng/dL     15-40 ng/dL                                       V/Adult        300-890 ng/dL     10-70 ng/dL                          Performed at [facility]  . PSA 12/15/2013 0.52  <=4.00 ng/mL Final   Comment: (NOTE)                          Test Methodology: ECLIA PSA  (Electrochemiluminescence Immunoassay)                          For PSA values from 2.5-4.0, particularly in younger men <60 years                          old, the AUA and NCCN suggest testing for % Free PSA (3515) and                          evaluation of the rate of increase in PSA (PSA velocity).                          Performed at [facility]  Anti-coag visit on 11/20/2013  Component Date Value Ref Range Status  . INR 11/20/2013 2.3   Final    PATHOLOGY: No new pathology. Prostate cancer with Gleason's 4+3 = 7  Urinalysis No results found for this basename: colorurine,  appearanceur,  labspec,  phurine,  glucoseu,  hgbur,  bilirubinur,  ketonesur,  proteinur,  urobilinogen,  nitrite,  leukocytesur    RADIOGRAPHIC STUDIES: No results found.  ASSESSMENT:  #1. 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 Significant vasomotor instability. 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 Streck in October 2005  #3. diffuse large B-cell lymphoma clinically stage IIIAS STATUS post cervical lymph node biopsy on 08/03/2003. His lymphoma CD20 positive and PET scan showed positivity in the spleen. Bone marrow aspiration and biopsy were negative however. He received R. CHOP x6 cycles with a complete remission established by PET scan criteria on 11/23/2003. He completed 6 full cycles total. He remains without disease recurrence. He   remains free of B. ymptomatology.  #4. Atrial fibrillation on warfarin, controlled ventricular response.  #4. Hypertension, controlled.  #6. Basal cell carcinoma, status post recent resection from nose, healing well.  #7. Diabetes mellitus, non-insulin requiring, controlled.  #8. Bladder stones, status post TURP plus cystolitholapoxy, at which time Gleason's 4+3 equals 7 adenocarcinoma of the prostate was  diagnosed       PLAN:  #1. Depot Lupron 22.5 mg intramuscularly today. In 3 months a six-month dose will be given because he is going to [place]. #2. Decrease Casodex to 25 mg daily but hot flashes are still significant, the drug could be discontinued. #3. Followup in 3 months with CBC, chem profile, PSA, testosterone level, beta 2 microglobulin, and LDH.   All questions were answered. The patient knows to call the clinic with any problems, questions or concerns. We can certainly see the patient much sooner if necessary.   I spent 30 minutes counseling the patient face to face. The total time spent in the appointment was 40 minutes.    Doroteo Bradford, MD 12/18/2013 12:17 PM  DISCLAIMER:  This note was dictated with voice recognition software.  Similar sounding words can inadvertently be transcribed inaccurately and may not be corrected upon review.

## 2013-12-26 ENCOUNTER — Ambulatory Visit (INDEPENDENT_AMBULATORY_CARE_PROVIDER_SITE_OTHER): Payer: Medicare Other | Admitting: Urology

## 2013-12-26 DIAGNOSIS — C61 Malignant neoplasm of prostate: Secondary | ICD-10-CM

## 2013-12-26 DIAGNOSIS — R82998 Other abnormal findings in urine: Secondary | ICD-10-CM

## 2013-12-26 DIAGNOSIS — R3915 Urgency of urination: Secondary | ICD-10-CM | POA: Diagnosis not present

## 2013-12-26 DIAGNOSIS — R972 Elevated prostate specific antigen [PSA]: Secondary | ICD-10-CM | POA: Diagnosis not present

## 2014-01-03 ENCOUNTER — Encounter: Payer: Self-pay | Admitting: Cardiology

## 2014-01-03 ENCOUNTER — Ambulatory Visit (INDEPENDENT_AMBULATORY_CARE_PROVIDER_SITE_OTHER): Payer: Medicare Other | Admitting: Cardiology

## 2014-01-03 VITALS — BP 138/98 | HR 68 | Ht 70.0 in | Wt 186.0 lb

## 2014-01-03 DIAGNOSIS — I10 Essential (primary) hypertension: Secondary | ICD-10-CM

## 2014-01-03 DIAGNOSIS — I4891 Unspecified atrial fibrillation: Secondary | ICD-10-CM

## 2014-01-03 NOTE — Patient Instructions (Signed)
Your physician wants you to follow-up in: 6 months with [name]. You will receive a reminder letter in the mail two months in advance. If you don't receive a letter, please call our office to schedule the follow-up appointment.  Your physician recommends that you continue on your current medications as directed. Please refer to the Current Medication list given to you today.  Your physician has requested that you regularly monitor and record your blood pressure readings at home. Please use the same machine at the same time of day to check your readings and record them to bring to your follow-up visit.  Thank you for choosing [facility]!!

## 2014-01-03 NOTE — Progress Notes (Signed)
Clinical Summary [name] is a 80 y.o.male seen today for follow up of the following medical problems.  1. Atrial fibrillation  - rate controlled with diltiazem, denies any palpitations. No SOB or dizziness.  - on coumadin followed here in our clinic, denies any troubles with bleeding   2. HTN  - significantly elevated blood pressures recently, including recent ER visit with systolics in [facility] - seen by PA Lenze last visit, noted recent increase in his Myrbetriq for his prostate which has stron assoc with HTN (up to 10% of cases). Also noted heavy daily sodium intake.  - mybetriq has been stopped, encouraged low sodium diet. Lisinopril/HCTZ increased to 20/25mg  daily.   - checking bp's at home, typically 140-150s/90s   3. Prostate cancer with bone mets - followed by onc  Past Medical History  Diagnosis Date  . Hypertension   . Atrial fibrillation     Dr. Harl Bowie- [facility] follows saw 11'14  . 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 Margot Chimes, thus far without recurrence.   . NHL (non-Hodgkin's lymphoma) 08/18/2011    Diffuse large B-cell lymphoma, clinically stage IIIA, CD20 positive, status post cervical lymph node biopsy 08/03/2003 on the left. PET scan was also positive in the spleen and small bowel region, but bone marrow aspiration and biopsy were negative. So he essentially had stage IIIAs. He received R-CHOP x6 cycles with CR established by PET scan criteria on 11/23/2003 with no evidence for relapse th  . Cataracts, both eyes     surgery planned May 2015  . Bladder stones     tx. with oral meds and antibiotics, now surgery planned  . Diabetes mellitus without complication      No Known Allergies   Current Outpatient Prescriptions    Medication Sig Dispense Refill  . acetaminophen (TYLENOL) 500 MG tablet Take 500 mg by mouth every 6 (six) hours as needed for mild pain.       Marland Kitchen amitriptyline (ELAVIL) 25 MG tablet Take 25 mg by mouth at bedtime.      . Azelastine HCl (ASTEPRO) 0.15 % SOLN Place 2 sprays into the nose 2 (two) times daily as needed.      . bicalutamide (CASODEX) 50 MG tablet Take 1 tablet (50 mg total) by mouth daily.  90 tablet  3  . Cranberry 200 MG CAPS Take 4,200 mg by mouth every morning.       . diltiazem (DILACOR XR) 180 MG 24 hr capsule Take 180 mg by mouth every evening.      [name] Calcium (STOOL SOFTENER PO) Take 1 tablet by mouth 2 (two) times daily.       Marland Kitchen gentamicin (GARAMYCIN) 0.3 % ophthalmic solution 1 drop every morning. As directed      . glipiZIDE (GLUCOTROL) 5 MG tablet Take by mouth daily before breakfast.      . linagliptin (TRADJENTA) 5 MG TABS tablet Take 5 mg by mouth at bedtime.       Marland Kitchen lisinopril-hydrochlorothiazide (PRINZIDE,ZESTORETIC) 20-25 MG per tablet Take 1 tablet by mouth daily.  90 tablet  3  . mirabegron ER (MYRBETRIQ) 50 MG TB24 tablet Take 50 mg by mouth daily.      Marland Kitchen omeprazole (PRILOSEC) 20 MG capsule Take 20 mg by  mouth daily.       . potassium chloride (K-DUR,KLOR-CON) 10 MEQ tablet Take 10 mEq by mouth daily.      . SENNA PO Take 1 capsule by mouth as needed.      . warfarin (COUMADIN) 2.5 MG tablet Take 1 tablet daily except 2 tablets on Mondays and Fridays  130 tablet  3   No current facility-administered medications for this visit.     Past Surgical History  Procedure Laterality Date  . Cholecystectomy    . Incision and drainage perirectal abscess    . Portacath placement      insertion and removal -last chemotherapy 10 yrs ago  . Lymph node dissection Left     '05  . Port-a-cath removal    . Transurethral resection of prostate N/A 08/10/2013    Procedure: TRANSURETHRAL RESECTION OF THE PROSTATE WITH GYRUS INSTRUMENTS;  Surgeon: [name],  MD;  Location: [facility];  Service: Urology;  Laterality: N/A;  . Cystoscopy with litholapaxy N/A 08/10/2013    Procedure: CYSTOSCOPY WITH LITHOLAPAXY WITH [name];  Surgeon: [name], MD;  Location: [facility];  Service: Urology;  Laterality: N/A;  . Cataract extraction Right 08/2013     No Known Allergies    No family history on file.   Social History Mr. Arrona reports that he has never smoked. He has never used smokeless tobacco. Mr. Cousineau reports that he does not drink alcohol.   Review of Systems CONSTITUTIONAL: No weight loss, fever, chills, weakness or fatigue.  HEENT: Eyes: No visual loss, blurred vision, double vision or yellow sclerae.No hearing loss, sneezing, congestion, runny nose or sore throat.  SKIN: No rash or itching.  CARDIOVASCULAR: per HPI RESPIRATORY: No shortness of breath, cough or sputum.  GASTROINTESTINAL: No anorexia, nausea, vomiting or diarrhea. No abdominal pain or blood.  GENITOURINARY: No burning on urination, no polyuria NEUROLOGICAL: No headache, dizziness, syncope, paralysis, ataxia, numbness or tingling in the extremities. No change in bowel or bladder control.  MUSCULOSKELETAL: No muscle, back pain, joint pain or stiffness.  LYMPHATICS: No enlarged nodes. No history of splenectomy.  PSYCHIATRIC: No history of depression or anxiety.  ENDOCRINOLOGIC: No reports of sweating, cold or heat intolerance. No polyuria or polydipsia.  Marland Kitchen   Physical Examination p 68 bp 138/98 Wt 186 lbs BMI 27 Gen: resting comfortably, no acute distress HEENT: no scleral icterus, pupils equal round and reactive, no palptable cervical adenopathy,  CV: irreg, no m/r/g, no JVD, no carotid bruits Resp: Clear to auscultation bilaterally GI: abdomen is soft, non-tender, non-distended, normal bowel sounds, no hepatosplenomegaly MSK: extremities are warm, no edema.  Skin: warm, no rash Neuro:  no focal deficits Psych: appropriate affect    Assessment and Plan    1. Afib  - no current symptoms, continue current medications for rate control  - continue coumadin   2. HTN  - bp at goal, given recent recommendations for patients over the age of 80 a target <150/90 is reasonable, which is where he is with his home numbers.  - continue current meds    F/u 6 months      Arnoldo Lenis, M.D., F.A.C.C.

## 2014-01-15 ENCOUNTER — Ambulatory Visit (INDEPENDENT_AMBULATORY_CARE_PROVIDER_SITE_OTHER): Payer: Medicare Other | Admitting: *Deleted

## 2014-01-15 DIAGNOSIS — Z5181 Encounter for therapeutic drug level monitoring: Secondary | ICD-10-CM | POA: Diagnosis not present

## 2014-01-15 DIAGNOSIS — I4891 Unspecified atrial fibrillation: Secondary | ICD-10-CM | POA: Diagnosis not present

## 2014-01-15 DIAGNOSIS — Z7901 Long term (current) use of anticoagulants: Secondary | ICD-10-CM

## 2014-01-15 LAB — POCT INR: INR: 3.2

## 2014-01-17 DIAGNOSIS — Z23 Encounter for immunization: Secondary | ICD-10-CM | POA: Diagnosis not present

## 2014-01-26 DIAGNOSIS — B351 Tinea unguium: Secondary | ICD-10-CM | POA: Diagnosis not present

## 2014-01-26 DIAGNOSIS — E1142 Type 2 diabetes mellitus with diabetic polyneuropathy: Secondary | ICD-10-CM | POA: Diagnosis not present

## 2014-01-29 ENCOUNTER — Other Ambulatory Visit: Payer: Self-pay

## 2014-01-29 ENCOUNTER — Telehealth: Payer: Self-pay | Admitting: *Deleted

## 2014-01-29 MED ORDER — WARFARIN SODIUM 2.5 MG PO TABS: ORAL_TABLET | ORAL | Status: DC

## 2014-01-29 NOTE — Telephone Encounter (Signed)
Pt can not get refill for coumadin because it is called in for one a day and he takes two a day. Pt is out of medication. [facility]

## 2014-02-02 DIAGNOSIS — N390 Urinary tract infection, site not specified: Secondary | ICD-10-CM | POA: Diagnosis not present

## 2014-02-12 ENCOUNTER — Encounter: Payer: Self-pay | Admitting: *Deleted

## 2014-02-13 ENCOUNTER — Ambulatory Visit (INDEPENDENT_AMBULATORY_CARE_PROVIDER_SITE_OTHER): Payer: Medicare Other | Admitting: Urology

## 2014-02-13 DIAGNOSIS — R3915 Urgency of urination: Secondary | ICD-10-CM | POA: Diagnosis not present

## 2014-02-13 DIAGNOSIS — N390 Urinary tract infection, site not specified: Secondary | ICD-10-CM | POA: Diagnosis not present

## 2014-02-13 DIAGNOSIS — C61 Malignant neoplasm of prostate: Secondary | ICD-10-CM | POA: Diagnosis not present

## 2014-02-14 ENCOUNTER — Ambulatory Visit (INDEPENDENT_AMBULATORY_CARE_PROVIDER_SITE_OTHER): Payer: Medicare Other | Admitting: *Deleted

## 2014-02-14 DIAGNOSIS — I4891 Unspecified atrial fibrillation: Secondary | ICD-10-CM | POA: Diagnosis not present

## 2014-02-14 DIAGNOSIS — Z7901 Long term (current) use of anticoagulants: Secondary | ICD-10-CM

## 2014-02-14 DIAGNOSIS — Z5181 Encounter for therapeutic drug level monitoring: Secondary | ICD-10-CM | POA: Diagnosis not present

## 2014-02-14 LAB — POCT INR: INR: 2.9

## 2014-02-16 ENCOUNTER — Other Ambulatory Visit: Payer: Self-pay

## 2014-02-16 MED ORDER — DILTIAZEM HCL ER 180 MG PO CP24: 180.0000 mg | ORAL_CAPSULE | Freq: Every evening | ORAL | Status: DC

## 2014-02-19 DIAGNOSIS — M545 Low back pain: Secondary | ICD-10-CM | POA: Diagnosis not present

## 2014-02-19 DIAGNOSIS — M79652 Pain in left thigh: Secondary | ICD-10-CM | POA: Diagnosis not present

## 2014-03-05 DIAGNOSIS — M79652 Pain in left thigh: Secondary | ICD-10-CM | POA: Diagnosis not present

## 2014-03-05 DIAGNOSIS — Z9289 Personal history of other medical treatment: Secondary | ICD-10-CM

## 2014-03-05 DIAGNOSIS — M47816 Spondylosis without myelopathy or radiculopathy, lumbar region: Secondary | ICD-10-CM | POA: Diagnosis not present

## 2014-03-05 HISTORY — DX: Personal history of other medical treatment: Z92.89

## 2014-03-09 ENCOUNTER — Other Ambulatory Visit (HOSPITAL_COMMUNITY): Payer: Medicare Other

## 2014-03-09 ENCOUNTER — Encounter (HOSPITAL_COMMUNITY): Payer: Medicare Other | Attending: Hematology and Oncology

## 2014-03-09 DIAGNOSIS — C61 Malignant neoplasm of prostate: Secondary | ICD-10-CM | POA: Insufficient documentation

## 2014-03-09 DIAGNOSIS — M4806 Spinal stenosis, lumbar region: Secondary | ICD-10-CM | POA: Diagnosis not present

## 2014-03-09 LAB — CBC WITH DIFFERENTIAL/PLATELET
Basophils Absolute: 0 10*3/uL (ref 0.0–0.1)
Basophils Relative: 1 % (ref 0–1)
Eosinophils Absolute: 0.1 10*3/uL (ref 0.0–0.7)
Eosinophils Relative: 2 % (ref 0–5)
HCT: 42.6 % (ref 39.0–52.0)
Hemoglobin: 15.4 g/dL (ref 13.0–17.0)
Lymphocytes Relative: 43 % (ref 12–46)
Lymphs Abs: 2.9 10*3/uL (ref 0.7–4.0)
MCH: 31.8 pg (ref 26.0–34.0)
MCHC: 36.2 g/dL — ABNORMAL HIGH (ref 30.0–36.0)
MCV: 87.8 fL (ref 78.0–100.0)
Monocytes Absolute: 0.6 10*3/uL (ref 0.1–1.0)
Monocytes Relative: 10 % (ref 3–12)
Neutro Abs: 2.9 10*3/uL (ref 1.7–7.7)
Neutrophils Relative %: 44 % (ref 43–77)
Platelets: 184 10*3/uL (ref 150–400)
RBC: 4.85 MIL/uL (ref 4.22–5.81)
RDW: 13.2 % (ref 11.5–15.5)
WBC: 6.6 10*3/uL (ref 4.0–10.5)

## 2014-03-09 LAB — COMPREHENSIVE METABOLIC PANEL
ALT: 26 U/L (ref 0–53)
AST: 21 U/L (ref 0–37)
Albumin: 3.9 g/dL (ref 3.5–5.2)
Alkaline Phosphatase: 115 U/L (ref 39–117)
Anion gap: 11 (ref 5–15)
BUN: 16 mg/dL (ref 6–23)
CO2: 29 mEq/L (ref 19–32)
Calcium: 10 mg/dL (ref 8.4–10.5)
Chloride: 100 mEq/L (ref 96–112)
Creatinine, Ser: 0.95 mg/dL (ref 0.50–1.35)
GFR calc Af Amer: 84 mL/min — ABNORMAL LOW (ref >90)
GFR calc non Af Amer: 72 mL/min — ABNORMAL LOW (ref >90)
Glucose, Bld: 159 mg/dL — ABNORMAL HIGH (ref 70–99)
Potassium: 3.7 mEq/L (ref 3.7–5.3)
Sodium: 140 mEq/L (ref 137–147)
Total Bilirubin: 0.7 mg/dL (ref 0.3–1.2)
Total Protein: 7.6 g/dL (ref 6.0–8.3)

## 2014-03-09 LAB — LACTATE DEHYDROGENASE: LDH: 139 U/L (ref 94–250)

## 2014-03-09 LAB — TESTOSTERONE: Testosterone: 29 ng/dL — ABNORMAL LOW (ref 300–890)

## 2014-03-09 NOTE — Progress Notes (Signed)
Labs for b59m,test,psa,ldh,cbcd,cmp

## 2014-03-10 LAB — PSA: PSA: 0.11 ng/mL (ref <=4.00)

## 2014-03-12 LAB — BETA 2 MICROGLOBULIN, SERUM: Beta-2 Microglobulin: 3.08 mg/L — ABNORMAL HIGH (ref <=2.51)

## 2014-03-13 ENCOUNTER — Encounter (HOSPITAL_COMMUNITY): Payer: Self-pay

## 2014-03-13 ENCOUNTER — Encounter (HOSPITAL_COMMUNITY): Payer: Medicare Other

## 2014-03-13 ENCOUNTER — Encounter (HOSPITAL_BASED_OUTPATIENT_CLINIC_OR_DEPARTMENT_OTHER): Payer: Medicare Other

## 2014-03-13 VITALS — BP 142/86 | HR 79 | Temp 97.9°F | Resp 18 | Wt 192.6 lb

## 2014-03-13 DIAGNOSIS — C61 Malignant neoplasm of prostate: Secondary | ICD-10-CM | POA: Diagnosis not present

## 2014-03-13 DIAGNOSIS — C7951 Secondary malignant neoplasm of bone: Secondary | ICD-10-CM

## 2014-03-13 DIAGNOSIS — C8590 Non-Hodgkin lymphoma, unspecified, unspecified site: Secondary | ICD-10-CM

## 2014-03-13 DIAGNOSIS — C49A0 Gastrointestinal stromal tumor, unspecified site: Secondary | ICD-10-CM

## 2014-03-13 DIAGNOSIS — Z5111 Encounter for antineoplastic chemotherapy: Secondary | ICD-10-CM | POA: Diagnosis not present

## 2014-03-13 DIAGNOSIS — C499 Malignant neoplasm of connective and soft tissue, unspecified: Secondary | ICD-10-CM

## 2014-03-13 MED ORDER — LEUPROLIDE ACETATE (6 MONTH) 45 MG ~~LOC~~ KIT
45.0000 mg | PACK | Freq: Once | SUBCUTANEOUS | Status: AC
  Administered 2014-03-13: 45 mg via SUBCUTANEOUS
  Filled 2014-03-13: qty 45

## 2014-03-13 MED ORDER — LEUPROLIDE ACETATE (3 MONTH) 22.5 MG IM KIT: 45.0000 mg | PACK | Freq: Once | INTRAMUSCULAR | Status: DC

## 2014-03-13 NOTE — Progress Notes (Signed)
[name] presents today for injection per MD orders. Eligard 45 mg administered SQ in right Abdomen. Administration without incident. Patient tolerated well.

## 2014-03-13 NOTE — Progress Notes (Signed)
[name]  OFFICE PROGRESS Jolee Ewing, MD [address] [place] [address]  DIAGNOSIS: Prostate cancer - Plan: DISCONTINUED: leuprolide (LUPRON) injection 45 mg  Bone metastases  GIST (gastrointestinal stromal tumor), malignant  NHL (non-Hodgkin's lymphoma)  Chief Complaint  Patient presents with  . Prostate cancer stage IV    Castrate sensitive    CURRENT THERAPY: Depo Lupron to receive 6 month injection today because of plans to go to [place]. Had been on Casodex with hot flashes, trial of 25 mg daily. 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  INTERVAL HISTORY: [name] 80 y.o. male returns for followup and continuation of [facility] agonist therapy for stage IV prostate cancer with bone metastases, recently diagnosed at [facility] revealing a Gleason's 4+3 = 7 with 70% of the tissue removed involved. PSA done on 09/08/2013 was 44.21 with a serum testosterone level of 203. Urologist recommended complete discontinuation of Casodex. He still gets hot flashes even without it. He also gets night sweats. Urination is better while on Casodex in regard to urine leakage. He denies any breast pain, worsening lower extremity swelling or redness, PND, orthopnea, palpitations, nausea, vomiting, diarrhea, constipation, skin rash, headache, or seizures.  MEDICAL HISTORY: Past Medical History  Diagnosis Date  . Hypertension   . Atrial fibrillation     Dr. Harl Bowie- [facility] follows saw 11'14  . 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 Margot Chimes, thus far without recurrence.   . NHL (non-Hodgkin's lymphoma) 08/18/2011    Diffuse  large B-cell lymphoma, clinically stage IIIA, CD20 positive, status post cervical lymph node biopsy 08/03/2003 on the left. PET scan was also positive in the spleen and small bowel region, but bone marrow aspiration and biopsy were negative. So he essentially had stage IIIAs. He received R-CHOP x6 cycles with CR established by PET scan criteria on 11/23/2003 with no evidence for relapse th  . Cataracts, both eyes     surgery planned May 2015  . Bladder stones     tx. with oral meds and antibiotics, now surgery planned  . Diabetes mellitus without complication     INTERIM HISTORY: has HYPERTENSION, UNSPECIFIED; ATRIAL FIBRILLATION; Encounter for long-term (current) use of anticoagulants; NHL (non-Hodgkin's lymphoma); GIST (gastrointestinal stromal tumor), malignant; Encounter for therapeutic drug monitoring; Hypertrophy of prostate with urinary obstruction and other lower urinary tract symptoms (LUTS); and Prostate cancer on his problem list.    No history exists.    ALLERGIES:  has No Known Allergies.  MEDICATIONS: has a current medication list which includes the following prescription(s): acetaminophen, amitriptyline, azelastine hcl, bicalutamide, cranberry, diltiazem, docusate calcium, glipizide, linagliptin, lisinopril-hydrochlorothiazide, omeprazole, potassium chloride, senna, and warfarin.  SURGICAL HISTORY:  Past Surgical History  Procedure Laterality Date  . Cholecystectomy    . Incision and drainage perirectal abscess    . Portacath placement      insertion and removal -last chemotherapy 10 yrs ago  . Lymph node dissection Left     '05  . Port-a-cath removal    . Transurethral resection of prostate N/A 08/10/2013    Procedure: TRANSURETHRAL RESECTION OF THE PROSTATE WITH GYRUS INSTRUMENTS;  Surgeon: [name], MD;  Location:  [facility];  Service: Urology;  Laterality: N/A;  . Cystoscopy with litholapaxy N/A 08/10/2013    Procedure: CYSTOSCOPY WITH LITHOLAPAXY WITH [name];   Surgeon: [name], MD;  Location: [facility];  Service: Urology;  Laterality: N/A;  . Cataract extraction Right 08/2013    FAMILY HISTORY: family history is not on file.  SOCIAL HISTORY:  reports that he has never smoked. He has never used smokeless tobacco. He reports that he does not drink alcohol or use illicit drugs.  REVIEW OF SYSTEMS:  Other than that discussed above is noncontributory.  PHYSICAL EXAMINATION: ECOG PERFORMANCE STATUS: 1 - Symptomatic but completely ambulatory  Blood pressure 142/86, pulse 79, temperature 97.9 F (36.6 C), temperature source Oral, resp. rate 18, weight 192 lb 9.6 oz (87.363 kg), SpO2 100 %.  GENERAL:alert, no distress and comfortable SKIN: skin color, texture, turgor are normal, no rashes or significant lesions EYES: PERLA; Conjunctiva are pink and non-injected, sclera clear SINUSES: No redness or tenderness over maxillary or ethmoid sinuses OROPHARYNX:no exudate, no erythema on lips, buccal mucosa, or tongue. NECK: supple, thyroid normal size, non-tender, without nodularity. No masses CHEST: Increased AP diameter with minimal gynecomastia. LYMPH:  no palpable lymphadenopathy in the cervical, axillary or inguinal LUNGS: clear to auscultation and percussion with normal breathing effort HEART: Irregularly irregular with no S3. ABDOMEN:abdomen soft, non-tender and normal bowel sounds. No vaginal megaly, ascites, or CVA tenderness. MUSCULOSKELETAL:no cyanosis of digits and no clubbing. Range of motion normal.  NEURO: alert & oriented x 3 with fluent speech, no focal motor/sensory deficits. Hearing deficit.   LABORATORY DATA:  Results for [name], [name] (MRN [id number]) as of 03/13/2014 10:35  Ref. Range 09/08/2013 10:47 12/15/2013 08:37 03/09/2014 08:55  Testosterone Latest Range: 300-890 ng/dL 203 (L) 25 (L) 29 (L)      Results for [name], [name] (MRN [id number]) as of 03/13/2014 07:21  Ref. Range 09/08/2013 10:47 12/15/2013 08:37  03/09/2014 08:55  PSA Latest Range: <=4.00 ng/mL 44.21 (H) 0.52 0.11     Lab on 03/09/2014  Component Date Value Ref Range Status  . WBC 03/09/2014 6.6  4.0 - 10.5 K/uL Final  . RBC 03/09/2014 4.85  4.22 - 5.81 MIL/uL Final  . Hemoglobin 03/09/2014 15.4  13.0 - 17.0 g/dL Final  . HCT 03/09/2014 42.6  39.0 - 52.0 % Final  . MCV 03/09/2014 87.8  78.0 - 100.0 fL Final  . MCH 03/09/2014 31.8  26.0 - 34.0 pg Final  . MCHC 03/09/2014 36.2* 30.0 - 36.0 g/dL Final  . RDW 03/09/2014 13.2  11.5 - 15.5 % Final  . Platelets 03/09/2014 184  150 - 400 K/uL Final  . Neutrophils Relative % 03/09/2014 44  43 - 77 % Final  . Neutro Abs 03/09/2014 2.9  1.7 - 7.7 K/uL Final  . Lymphocytes Relative 03/09/2014 43  12 - 46 % Final  . Lymphs Abs 03/09/2014 2.9  0.7 - 4.0 K/uL Final  . Monocytes Relative 03/09/2014 10  3 - 12 % Final  . Monocytes Absolute 03/09/2014 0.6  0.1 - 1.0 K/uL Final  . Eosinophils Relative 03/09/2014 2  0 - 5 % Final  . Eosinophils Absolute 03/09/2014 0.1  0.0 - 0.7 K/uL Final  . Basophils Relative 03/09/2014 1  0 - 1 % Final  . Basophils Absolute 03/09/2014 0.0  0.0 - 0.1 K/uL Final  . Sodium 03/09/2014 140  137 - 147 mEq/L Final  . Potassium 03/09/2014 3.7  3.7 - 5.3 mEq/L Final  . Chloride 03/09/2014  100  96 - 112 mEq/L Final  . CO2 03/09/2014 29  19 - 32 mEq/L Final  . Glucose, Bld 03/09/2014 159* 70 - 99 mg/dL Final  . BUN 03/09/2014 16  6 - 23 mg/dL Final  . Creatinine, Ser 03/09/2014 0.95  0.50 - 1.35 mg/dL Final  . Calcium 03/09/2014 10.0  8.4 - 10.5 mg/dL Final  . Total Protein 03/09/2014 7.6  6.0 - 8.3 g/dL Final  . Albumin 03/09/2014 3.9  3.5 - 5.2 g/dL Final  . AST 03/09/2014 21  0 - 37 U/L Final  . ALT 03/09/2014 26  0 - 53 U/L Final  . Alkaline Phosphatase 03/09/2014 115  39 - 117 U/L Final  . Total Bilirubin 03/09/2014 0.7  0.3 - 1.2 mg/dL Final  . GFR calc non Af Amer 03/09/2014 72* >90 mL/min Final  . GFR calc Af Amer 03/09/2014 84* >90 mL/min Final    Comment: (NOTE) The eGFR has been calculated using the CKD EPI equation. This calculation has not been validated in all clinical situations. eGFR's persistently <90 mL/min signify possible Chronic Kidney Disease.   . Anion gap 03/09/2014 11  5 - 15 Final  . LDH 03/09/2014 139  94 - 250 U/L Final  . PSA 03/09/2014 0.11  <=4.00 ng/mL Final   Comment: (NOTE) Test Methodology: ECLIA PSA (Electrochemiluminescence Immunoassay) For PSA values from 2.5-4.0, particularly in younger men <9 years old, the AUA and NCCN suggest testing for % Free PSA (3515) and evaluation of the rate of increase in PSA (PSA velocity). Performed at [facility]   . Testosterone 03/09/2014 29* 300 - 890 ng/dL Final   Comment: (NOTE)          Tanner Stage       Male              Male              I              < 30 ng/dL        < 10 ng/dL              II             < 150 ng/dL       < 30 ng/dL              III            100-320 ng/dL     < 35 ng/dL              IV             200-970 ng/dL     15-40 ng/dL              V/Adult        300-890 ng/dL     10-70 ng/dL Performed at [facility]   . Beta-2 Microglobulin 03/09/2014 3.08* <=2.51 mg/L Final   Performed at [facility]  Anti-coag visit on 02/14/2014  Component Date Value Ref Range Status  . INR 02/14/2014 2.9   Final    PATHOLOGY: No new pathology.  Urinalysis No results found for: COLORURINE, APPEARANCEUR, LABSPEC, PHURINE, GLUCOSEU, HGBUR, BILIRUBINUR, KETONESUR, PROTEINUR, UROBILINOGEN, NITRITE, LEUKOCYTESUR  RADIOGRAPHIC STUDIES: No results found. 03/07/2014: MRI lumbar spine with and without contrast #1. 1.7 cm enhancing lesion in the right sacral ala smaller enhancing lesions in the T2, L1, and S1 vertebral bodies consistent with osseous metastases. #2. Diffuse  lumbar disc degeneration and facet arthrosis without spinal canal stenosis. Severe right and moderate left neural foraminal stenosis at L4-5 and mild to  moderate lateral recess stenosis at multiple levels.  ASSESSMENT:  #1. 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 Significant vasomotor instability on full dose Casodex. 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 Margot Chimes in October 2005  #3. Diffuse large B-cell lymphoma clinically stage IIIAS STATUS post cervical lymph node biopsy on 08/03/2003. His lymphoma CD20 positive and PET scan showed positivity in the spleen. Bone marrow aspiration and biopsy were negative however. He received R. CHOP x6 cycles with a complete remission established by PET scan criteria on 11/23/2003. He completed 6 full cycles total. He remains without disease recurrence. He remains free of B. ymptomatology.  #4. Atrial fibrillation on warfarin, controlled ventricular response.  #4. Hypertension, controlled.  #6. Basal cell carcinoma, status post recent resection from nose, healing well.  #7. Diabetes mellitus, non-insulin requiring, controlled.  #8. Bladder stones, status post TURP plus cystolitholapoxy, at which time Gleason's 4+3 equals 7 adenocarcinoma of the prostate was diagnosed   PLAN:  #1. Resume Casodex 25 mg daily since it helps his urine flow.  #2. Eligard 45 mg intramuscularly today. Patient intends to leave for [place] for 6 months on 03/20/2014. #3. Follow-up in 6 months with CBC, chem profile, PSA, testosterone level, beta-2 microglobulin, LDH.   All questions were answered. The patient knows to call the clinic with any problems, questions or concerns. We can certainly see the patient much sooner if necessary.   I spent 25 minutes counseling the patient face to face. The total time spent in the appointment was 30 minutes.    Doroteo Bradford, MD 03/13/2014 3:02 PM  DISCLAIMER:  This note was dictated with voice recognition software.   Similar sounding words can inadvertently be transcribed inaccurately and may not be corrected upon review.

## 2014-03-13 NOTE — Patient Instructions (Signed)
[place] Discharge Instructions  RECOMMENDATIONS MADE BY THE CONSULTANT AND ANY TEST RESULTS WILL BE SENT TO YOUR REFERRING PHYSICIAN.  EXAM FINDINGS BY THE PHYSICIAN TODAY AND SIGNS OR SYMPTOMS TO REPORT TO CLINIC OR PRIMARY PHYSICIAN: Exam and findings as discussed by Dr. Barnet Glasgow.  Will give you the 6 month shot of Eligard today and when you come back in 6 months you can resume the 3 month injections.  MEDICATIONS PRESCRIBED:  Casodex take 25 mg daily (1/2 of the 50 mg tablet)  INSTRUCTIONS/FOLLOW-UP: Follow-up in 6 months with labs and office visit.  Thank you for choosing [facility] to provide your oncology and hematology care.  To afford each patient quality time with our providers, please arrive at least 15 minutes before your scheduled appointment time.  With your help, our goal is to use those 15 minutes to complete the necessary work-up to ensure our physicians have the information they need to help with your evaluation and healthcare recommendations.    Effective January 1st, 2014, we ask that you re-schedule your appointment with our physicians should you arrive 10 or more minutes late for your appointment.  We strive to give you quality time with our providers, and arriving late affects you and other patients whose appointments are after yours.    Again, thank you for choosing [facility]  Our hope is that these requests will decrease the amount of time that you wait before being seen by our physicians.       _____________________________________________________________  Should you have questions after your visit to [facility], please contact our office at [phone] between the hours of 8:30 a.m. and 4:30 p.m.  Voicemails left after 4:30 p.m. will not be returned until the following business day.  For prescription refill requests, have your pharmacy contact our office with your prescription refill request.     _______________________________________________________________  We hope that we have given you very good care.  You may receive a patient satisfaction survey in the mail, please complete it and return it as soon as possible.  We value your feedback!  _______________________________________________________________  Have you asked about our STAR program?  STAR stands for Survivorship Training and Rehabilitation, and this is a nationally recognized cancer care program that focuses on survivorship and rehabilitation.  Cancer and cancer treatments may cause problems, such as, pain, making you feel tired and keeping you from doing the things that you need or want to do. Cancer rehabilitation can help. Our goal is to reduce these troubling effects and help you have the best quality of life possible.  You may receive a survey from a nurse that asks questions about your current state of health.  Based on the survey results, all eligible patients will be referred to the [facility] program for an evaluation so we can better serve you!  A frequently asked questions sheet is available upon request.

## 2014-03-14 ENCOUNTER — Ambulatory Visit (INDEPENDENT_AMBULATORY_CARE_PROVIDER_SITE_OTHER): Payer: Medicare Other | Admitting: *Deleted

## 2014-03-14 DIAGNOSIS — Z5181 Encounter for therapeutic drug level monitoring: Secondary | ICD-10-CM | POA: Diagnosis not present

## 2014-03-14 DIAGNOSIS — Z7901 Long term (current) use of anticoagulants: Secondary | ICD-10-CM

## 2014-03-14 DIAGNOSIS — I4891 Unspecified atrial fibrillation: Secondary | ICD-10-CM | POA: Diagnosis not present

## 2014-03-14 LAB — POCT INR: INR: 3.1

## 2014-04-10 DIAGNOSIS — I4891 Unspecified atrial fibrillation: Secondary | ICD-10-CM | POA: Diagnosis not present

## 2014-04-10 DIAGNOSIS — Z5181 Encounter for therapeutic drug level monitoring: Secondary | ICD-10-CM | POA: Diagnosis not present

## 2014-04-11 ENCOUNTER — Ambulatory Visit (INDEPENDENT_AMBULATORY_CARE_PROVIDER_SITE_OTHER): Payer: Medicare Other | Admitting: *Deleted

## 2014-04-11 DIAGNOSIS — I4891 Unspecified atrial fibrillation: Secondary | ICD-10-CM

## 2014-04-11 DIAGNOSIS — Z5181 Encounter for therapeutic drug level monitoring: Secondary | ICD-10-CM

## 2014-04-11 LAB — POCT INR: INR: 3.1

## 2014-04-27 DIAGNOSIS — Z5181 Encounter for therapeutic drug level monitoring: Secondary | ICD-10-CM | POA: Diagnosis not present

## 2014-04-30 ENCOUNTER — Ambulatory Visit (INDEPENDENT_AMBULATORY_CARE_PROVIDER_SITE_OTHER): Payer: Medicare Other | Admitting: *Deleted

## 2014-04-30 DIAGNOSIS — Z5181 Encounter for therapeutic drug level monitoring: Secondary | ICD-10-CM

## 2014-04-30 DIAGNOSIS — I4891 Unspecified atrial fibrillation: Secondary | ICD-10-CM

## 2014-04-30 LAB — PROTIME-INR: INR: 1.8 — AB (ref <=1.1)

## 2014-05-17 DIAGNOSIS — Z5181 Encounter for therapeutic drug level monitoring: Secondary | ICD-10-CM | POA: Diagnosis not present

## 2014-05-17 LAB — PROTIME-INR: INR: 2.6 — AB (ref 0.9–1.1)

## 2014-05-18 ENCOUNTER — Ambulatory Visit (INDEPENDENT_AMBULATORY_CARE_PROVIDER_SITE_OTHER): Payer: Medicare Other | Admitting: Pharmacist

## 2014-05-18 DIAGNOSIS — I4891 Unspecified atrial fibrillation: Secondary | ICD-10-CM

## 2014-05-18 DIAGNOSIS — Z5181 Encounter for therapeutic drug level monitoring: Secondary | ICD-10-CM

## 2014-06-22 DIAGNOSIS — Z5181 Encounter for therapeutic drug level monitoring: Secondary | ICD-10-CM | POA: Diagnosis not present

## 2014-06-22 LAB — POCT INR: INR: 2.8

## 2014-06-25 ENCOUNTER — Ambulatory Visit (INDEPENDENT_AMBULATORY_CARE_PROVIDER_SITE_OTHER): Payer: Medicare Other | Admitting: *Deleted

## 2014-06-25 DIAGNOSIS — I480 Paroxysmal atrial fibrillation: Secondary | ICD-10-CM

## 2014-06-25 DIAGNOSIS — Z5181 Encounter for therapeutic drug level monitoring: Secondary | ICD-10-CM

## 2014-07-18 ENCOUNTER — Ambulatory Visit (INDEPENDENT_AMBULATORY_CARE_PROVIDER_SITE_OTHER): Payer: Medicare Other | Admitting: *Deleted

## 2014-07-18 DIAGNOSIS — I480 Paroxysmal atrial fibrillation: Secondary | ICD-10-CM

## 2014-07-18 DIAGNOSIS — I4891 Unspecified atrial fibrillation: Secondary | ICD-10-CM

## 2014-07-18 DIAGNOSIS — Z7901 Long term (current) use of anticoagulants: Secondary | ICD-10-CM | POA: Diagnosis not present

## 2014-07-18 DIAGNOSIS — Z5181 Encounter for therapeutic drug level monitoring: Secondary | ICD-10-CM

## 2014-07-18 LAB — POCT INR: INR: 2.8

## 2014-07-31 ENCOUNTER — Ambulatory Visit (INDEPENDENT_AMBULATORY_CARE_PROVIDER_SITE_OTHER): Payer: Medicare Other | Admitting: Urology

## 2014-07-31 DIAGNOSIS — C61 Malignant neoplasm of prostate: Secondary | ICD-10-CM

## 2014-07-31 DIAGNOSIS — E291 Testicular hypofunction: Secondary | ICD-10-CM

## 2014-07-31 DIAGNOSIS — N390 Urinary tract infection, site not specified: Secondary | ICD-10-CM

## 2014-07-31 DIAGNOSIS — E1342 Other specified diabetes mellitus with diabetic polyneuropathy: Secondary | ICD-10-CM | POA: Diagnosis not present

## 2014-07-31 DIAGNOSIS — B351 Tinea unguium: Secondary | ICD-10-CM | POA: Diagnosis not present

## 2014-08-29 ENCOUNTER — Ambulatory Visit (INDEPENDENT_AMBULATORY_CARE_PROVIDER_SITE_OTHER): Payer: Medicare Other | Admitting: *Deleted

## 2014-08-29 DIAGNOSIS — Z7901 Long term (current) use of anticoagulants: Secondary | ICD-10-CM

## 2014-08-29 DIAGNOSIS — I4891 Unspecified atrial fibrillation: Secondary | ICD-10-CM

## 2014-08-29 DIAGNOSIS — Z5181 Encounter for therapeutic drug level monitoring: Secondary | ICD-10-CM | POA: Diagnosis not present

## 2014-08-29 LAB — POCT INR: INR: 3.4

## 2014-08-31 ENCOUNTER — Other Ambulatory Visit (HOSPITAL_COMMUNITY): Payer: Self-pay

## 2014-08-31 DIAGNOSIS — C61 Malignant neoplasm of prostate: Secondary | ICD-10-CM

## 2014-08-31 DIAGNOSIS — C8590 Non-Hodgkin lymphoma, unspecified, unspecified site: Secondary | ICD-10-CM

## 2014-09-03 DIAGNOSIS — I4891 Unspecified atrial fibrillation: Secondary | ICD-10-CM | POA: Diagnosis not present

## 2014-09-03 DIAGNOSIS — C8580 Other specified types of non-Hodgkin lymphoma, unspecified site: Secondary | ICD-10-CM | POA: Diagnosis not present

## 2014-09-03 DIAGNOSIS — C61 Malignant neoplasm of prostate: Secondary | ICD-10-CM | POA: Diagnosis not present

## 2014-09-03 DIAGNOSIS — E1165 Type 2 diabetes mellitus with hyperglycemia: Secondary | ICD-10-CM | POA: Diagnosis not present

## 2014-09-07 ENCOUNTER — Encounter (HOSPITAL_COMMUNITY): Payer: Medicare Other | Attending: Hematology & Oncology

## 2014-09-07 DIAGNOSIS — C8590 Non-Hodgkin lymphoma, unspecified, unspecified site: Secondary | ICD-10-CM | POA: Diagnosis not present

## 2014-09-07 DIAGNOSIS — C61 Malignant neoplasm of prostate: Secondary | ICD-10-CM | POA: Insufficient documentation

## 2014-09-07 LAB — CBC WITH DIFFERENTIAL/PLATELET
Basophils Absolute: 0 10*3/uL (ref 0.0–0.1)
Basophils Relative: 1 % (ref 0–1)
Eosinophils Absolute: 0.1 10*3/uL (ref 0.0–0.7)
Eosinophils Relative: 2 % (ref 0–5)
HCT: 43.1 % (ref 39.0–52.0)
Hemoglobin: 15 g/dL (ref 13.0–17.0)
Lymphocytes Relative: 37 % (ref 12–46)
Lymphs Abs: 2.2 10*3/uL (ref 0.7–4.0)
MCH: 30.9 pg (ref 26.0–34.0)
MCHC: 34.8 g/dL (ref 30.0–36.0)
MCV: 88.7 fL (ref 78.0–100.0)
Monocytes Absolute: 0.6 10*3/uL (ref 0.1–1.0)
Monocytes Relative: 9 % (ref 3–12)
Neutro Abs: 3.1 10*3/uL (ref 1.7–7.7)
Neutrophils Relative %: 51 % (ref 43–77)
Platelets: 191 10*3/uL (ref 150–400)
RBC: 4.86 MIL/uL (ref 4.22–5.81)
RDW: 13.2 % (ref 11.5–15.5)
WBC: 6 10*3/uL (ref 4.0–10.5)

## 2014-09-07 LAB — COMPREHENSIVE METABOLIC PANEL
ALT: 18 U/L (ref 17–63)
AST: 21 U/L (ref 15–41)
Albumin: 4.3 g/dL (ref 3.5–5.0)
Alkaline Phosphatase: 81 U/L (ref 38–126)
Anion gap: 8 (ref 5–15)
BUN: 19 mg/dL (ref 6–20)
CO2: 30 mmol/L (ref 22–32)
Calcium: 9.8 mg/dL (ref 8.9–10.3)
Chloride: 102 mmol/L (ref 101–111)
Creatinine, Ser: 0.91 mg/dL (ref 0.61–1.24)
GFR calc Af Amer: >60 mL/min (ref >60)
GFR calc non Af Amer: >60 mL/min (ref >60)
Glucose, Bld: 147 mg/dL — ABNORMAL HIGH (ref 65–99)
Potassium: 3.9 mmol/L (ref 3.5–5.1)
Sodium: 140 mmol/L (ref 135–145)
Total Bilirubin: 0.9 mg/dL (ref 0.3–1.2)
Total Protein: 7.4 g/dL (ref 6.5–8.1)

## 2014-09-07 LAB — PSA: PSA: 3.11 ng/mL (ref 0.00–4.00)

## 2014-09-07 LAB — LACTATE DEHYDROGENASE: LDH: 161 U/L (ref 98–192)

## 2014-09-07 NOTE — Progress Notes (Signed)
Labs drawn

## 2014-09-08 LAB — TESTOSTERONE: Testosterone: 3 ng/dL — ABNORMAL LOW (ref 348–1197)

## 2014-09-08 LAB — BETA 2 MICROGLOBULIN, SERUM: Beta-2 Microglobulin: 2.3 mg/L (ref 0.6–2.4)

## 2014-09-11 ENCOUNTER — Encounter (HOSPITAL_COMMUNITY): Payer: Medicare Other

## 2014-09-11 ENCOUNTER — Encounter (HOSPITAL_BASED_OUTPATIENT_CLINIC_OR_DEPARTMENT_OTHER): Payer: Medicare Other | Admitting: Hematology & Oncology

## 2014-09-11 ENCOUNTER — Encounter (HOSPITAL_COMMUNITY): Payer: Self-pay | Admitting: Hematology & Oncology

## 2014-09-11 VITALS — BP 154/72 | HR 76 | Temp 97.9°F | Resp 16 | Wt 190.2 lb

## 2014-09-11 DIAGNOSIS — C7951 Secondary malignant neoplasm of bone: Secondary | ICD-10-CM | POA: Diagnosis not present

## 2014-09-11 DIAGNOSIS — Z8572 Personal history of non-Hodgkin lymphomas: Secondary | ICD-10-CM

## 2014-09-11 DIAGNOSIS — Z79899 Other long term (current) drug therapy: Secondary | ICD-10-CM

## 2014-09-11 DIAGNOSIS — Z85068 Personal history of other malignant neoplasm of small intestine: Secondary | ICD-10-CM

## 2014-09-11 DIAGNOSIS — Z5111 Encounter for antineoplastic chemotherapy: Secondary | ICD-10-CM

## 2014-09-11 DIAGNOSIS — C61 Malignant neoplasm of prostate: Secondary | ICD-10-CM | POA: Diagnosis not present

## 2014-09-11 MED ORDER — LEUPROLIDE ACETATE (3 MONTH) 22.5 MG IM KIT
45.0000 mg | PACK | Freq: Once | INTRAMUSCULAR | Status: AC
  Administered 2014-09-11: 45 mg via INTRAMUSCULAR
  Filled 2014-09-11: qty 45

## 2014-09-11 NOTE — Progress Notes (Signed)
[name] presents today for injection per MD orders. lupron 45 mg  administered IM ztrack in divided doses in right and left gluteal. Administration without incident. Patient tolerated well.

## 2014-09-11 NOTE — Patient Instructions (Signed)
[place] at [facility] Discharge Instructions  RECOMMENDATIONS MADE BY THE CONSULTANT AND ANY TEST RESULTS WILL BE SENT TO YOUR REFERRING PHYSICIAN.  Exam and discussion by Dr. Whitney Muse. Will give you Lupron every 6 months. Want to get your test results from Dr. Eulogio Ditch Will see you back in 3 months with labs and office visit.  Thank you for choosing [place] at [facility] to provide your oncology and hematology care.  To afford each patient quality time with our provider, please arrive at least 15 minutes before your scheduled appointment time.    You need to re-schedule your appointment should you arrive 10 or more minutes late.  We strive to give you quality time with our providers, and arriving late affects you and other patients whose appointments are after yours.  Also, if you no show three or more times for appointments you may be dismissed from the clinic at the providers discretion.     Again, thank you for choosing [facility].  Our hope is that these requests will decrease the amount of time that you wait before being seen by our physicians.       _____________________________________________________________  Should you have questions after your visit to [facility], please contact our office at [phone] between the hours of 8:30 a.m. and 4:30 p.m.  Voicemails left after 4:30 p.m. will not be returned until the following business day.  For prescription refill requests, have your pharmacy contact our office.

## 2014-09-11 NOTE — Progress Notes (Signed)
Please see doctors encounter for more information 

## 2014-09-11 NOTE — Progress Notes (Signed)
[name],[name], MD [place] / [place] [address]   DIAGNOSIS: NHL (non-Hodgkin's lymphoma)   Staging form: Lymphoid Neoplasms, AJCC 6th Edition     Clinical: Stage III - Signed by Baird Cancer, PA on 08/18/2011  1. 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 Significant vasomotor instability on full dose Casodex.  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 Margot Chimes in October 2005  #3. Diffuse large B-cell lymphoma clinically stage IIIAS STATUS post cervical lymph node biopsy on 08/03/2003. His lymphoma CD20 positive and PET scan showed positivity in the spleen. Bone marrow aspiration and biopsy were negative however. He received R. CHOP x6 cycles with a complete remission established by PET scan criteria on 11/23/2003. He completed 6 full cycles total. He remains without disease recurrence. He remains free of B. ymptomatology.   CURRENT THERAPY: Lupron every 6 months  INTERVAL HISTORY: [name] 81 y.o. male returns for follow up. He states he saw Dr. Eulogio Ditch in April and was advised that everything is OK. He and his wife travel to [place] and stay every year from January through April.   He is doing well with no major complaints. He has 1-2 hot flashes every night that last 5 minutes. Denies any new pain.  He takes calcium once daily. He does not take vitamin D. He enjoys riding his bike.   MEDICAL HISTORY: Past Medical History  Diagnosis Date  . Hypertension   . Atrial fibrillation     Dr. Harl Bowie- [facility] follows saw 11'14  . 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  Margot Chimes, thus far without recurrence.   . NHL (non-Hodgkin's lymphoma) 08/18/2011    Diffuse large B-cell lymphoma, clinically stage IIIA, CD20 positive, status post cervical lymph node biopsy 08/03/2003 on the left. PET scan was also positive in the spleen and small bowel region, but bone marrow aspiration and biopsy were negative. So he essentially had stage IIIAs. He received R-CHOP x6 cycles with CR established by PET scan criteria on 11/23/2003 with no evidence for relapse th  . Cataracts, both eyes     surgery planned May 2015  . Bladder stones     tx. with oral meds and antibiotics, now surgery planned  . Diabetes mellitus without complication     has HYPERTENSION, UNSPECIFIED; ATRIAL FIBRILLATION; Long term (current) use of anticoagulants; NHL (non-Hodgkin's lymphoma); GIST (gastrointestinal stromal tumor), malignant; Encounter for therapeutic drug monitoring; Hypertrophy of prostate with urinary obstruction and other lower urinary tract symptoms (LUTS); and Prostate cancer on his problem list.     has No Known Allergies.  Current Outpatient Prescriptions on File Prior to Visit  Medication Sig Dispense Refill  . acetaminophen (TYLENOL) 500 MG tablet Take 500 mg by mouth every 6 (six) hours as needed for mild pain.     Marland Kitchen amitriptyline (ELAVIL) 25 MG tablet Take 25 mg by mouth at bedtime.    . Azelastine HCl (ASTEPRO) 0.15 % SOLN Place 2 sprays into the nose 2 (two) times daily as needed.    . bicalutamide (CASODEX) 50 MG tablet Take 50 mg by mouth daily.    Marland Kitchen  Cranberry 200 MG CAPS Take 4,200 mg by mouth every morning.     . diltiazem (DILACOR XR) 180 MG 24 hr capsule Take 1 capsule (180 mg total) by mouth every evening. 90 capsule 3  . Docusate Calcium (STOOL SOFTENER PO) Take 1 tablet by mouth 2 (two) times daily.     Marland Kitchen glipiZIDE (GLUCOTROL) 5 MG tablet Take by mouth daily before breakfast.    . linagliptin (TRADJENTA) 5 MG TABS tablet Take 5 mg by mouth at bedtime.     Marland Kitchen  lisinopril-hydrochlorothiazide (PRINZIDE,ZESTORETIC) 20-25 MG per tablet Take 1 tablet by mouth daily. 90 tablet 3  . omeprazole (PRILOSEC) 20 MG capsule Take 20 mg by mouth daily.     . potassium chloride (K-DUR,KLOR-CON) 10 MEQ tablet Take 10 mEq by mouth daily.    Marland Kitchen warfarin (COUMADIN) 2.5 MG tablet Take 2 tablets daily except 1 tablet on M,W,F 60 tablet 6  . SENNA PO Take 1 capsule by mouth as needed.     No current facility-administered medications on file prior to visit.     SURGICAL HISTORY: Past Surgical History  Procedure Laterality Date  . Cholecystectomy    . Incision and drainage perirectal abscess    . Portacath placement      insertion and removal -last chemotherapy 10 yrs ago  . Lymph node dissection Left     '05  . Port-a-cath removal    . Transurethral resection of prostate N/A 08/10/2013    Procedure: TRANSURETHRAL RESECTION OF THE PROSTATE WITH GYRUS INSTRUMENTS;  Surgeon: [name], MD;  Location: [facility];  Service: Urology;  Laterality: N/A;  . Cystoscopy with litholapaxy N/A 08/10/2013    Procedure: CYSTOSCOPY WITH LITHOLAPAXY WITH [name];  Surgeon: [name], MD;  Location: [facility];  Service: Urology;  Laterality: N/A;  . Cataract extraction Right 08/2013    SOCIAL HISTORY: History   Social History  . Marital Status: Married    Spouse Name: N/A  . Number of Children: N/A  . Years of Education: N/A   Occupational History  . retired     Hotel manager   Social History Main Topics  . Smoking status: Never Smoker   . Smokeless tobacco: Never Used  . Alcohol Use: No  . Drug Use: No  . Sexual Activity: Not Currently   Other Topics Concern  . Not on file   Social History Narrative  He has 8 grandchildren. He takes care of his garden and mows his lawn. 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  FAMILY HISTORY: History reviewed. No pertinent family history.   Father died of pneumonia when the patient was 51 year of age Mother died of MI at 38 All of his  siblings are deceased  Review of Systems  Constitutional: Negative for fever, chills, weight loss and malaise/fatigue.  HENT: Negative for congestion, hearing loss, nosebleeds, sore throat and tinnitus.   Eyes: Negative for blurred vision, double vision, pain and discharge.  Respiratory: Negative for cough, hemoptysis, sputum production, shortness of breath and wheezing.   Cardiovascular: Negative for chest pain, palpitations, claudication, leg swelling and PND.  Gastrointestinal: Negative for heartburn, nausea, vomiting, abdominal pain, diarrhea, constipation, blood in stool and melena.  Genitourinary: Negative for dysuria, urgency, frequency and hematuria.  Musculoskeletal: Negative for myalgias, joint pain and falls.  Skin: Negative for itching and rash.  Neurological: Negative for dizziness, tingling, tremors, sensory change, speech change, focal weakness, seizures, loss of consciousness, weakness and headaches.  Endo/Heme/Allergies: Does not bruise/bleed easily.  Psychiatric/Behavioral: Negative  for depression, suicidal ideas, memory loss and substance abuse. The patient is not nervous/anxious and does not have insomnia.   He is without complaints  PHYSICAL EXAMINATION  ECOG PERFORMANCE STATUS: 0 - Asymptomatic  Filed Vitals:   09/11/14 1203  BP: 154/72  Pulse: 76  Temp: 97.9 F (36.6 C)  Resp: 16    Physical Exam  Constitutional: He is oriented to person, place, and time and well-developed, well-nourished, and in no distress.  HENT:  Head: Normocephalic and atraumatic.  Nose: Nose normal.  Mouth/Throat: Oropharynx is clear and moist. No oropharyngeal exudate.  Eyes: Conjunctivae and EOM are normal. Pupils are equal, round, and reactive to light. Right eye exhibits no discharge. Left eye exhibits no discharge. No scleral icterus.  Neck: Normal range of motion. Neck supple. No tracheal deviation present. No thyromegaly present.  Cardiovascular: Normal rate, regular rhythm  and normal heart sounds.  Exam reveals no gallop and no friction rub.   No murmur heard. Pulmonary/Chest: Effort normal and breath sounds normal. He has no wheezes. He has no rales.  Abdominal: Soft. Bowel sounds are normal. He exhibits no distension and no mass. There is no tenderness. There is no rebound and no guarding.  Musculoskeletal: Normal range of motion. He exhibits no edema.  Lymphadenopathy:    He has no cervical adenopathy.  Neurological: He is alert and oriented to person, place, and time. He has normal reflexes. No cranial nerve deficit. Gait normal. Coordination normal.  Skin: Skin is warm and dry. No rash noted.  Psychiatric: Mood, memory, affect and judgment normal.  Nursing note and vitals reviewed.   LABORATORY DATA:  CBC    Component Value Date/Time   WBC 6.0 09/07/2014 1105   RBC 4.86 09/07/2014 1105   RBC 4.77 09/08/2013 1047   HGB 15.0 09/07/2014 1105   HCT 43.1 09/07/2014 1105   PLT 191 09/07/2014 1105   MCV 88.7 09/07/2014 1105   MCH 30.9 09/07/2014 1105   MCHC 34.8 09/07/2014 1105   RDW 13.2 09/07/2014 1105   LYMPHSABS 2.2 09/07/2014 1105   MONOABS 0.6 09/07/2014 1105   EOSABS 0.1 09/07/2014 1105   BASOSABS 0.0 09/07/2014 1105   CMP     Component Value Date/Time   NA 140 09/07/2014 1105   K 3.9 09/07/2014 1105   CL 102 09/07/2014 1105   CO2 30 09/07/2014 1105   GLUCOSE 147* 09/07/2014 1105   BUN 19 09/07/2014 1105   CREATININE 0.91 09/07/2014 1105   CREATININE 1.14 07/27/2013 1004   CALCIUM 9.8 09/07/2014 1105   PROT 7.4 09/07/2014 1105   ALBUMIN 4.3 09/07/2014 1105   AST 21 09/07/2014 1105   ALT 18 09/07/2014 1105   ALKPHOS 81 09/07/2014 1105   BILITOT 0.9 09/07/2014 1105   GFRNONAA >60 09/07/2014 1105   GFRNONAA 58* 07/27/2013 1004   GFRAA >60 09/07/2014 1105   GFRAA 66 07/27/2013 1004    RADIOLOGY: CLINICAL DATA: Prostate cancer  EXAM: NUCLEAR MEDICINE WHOLE BODY BONE SCAN IMPRESSION: Abnormal foci of increased tracer  localization in the lumbar spine, posterior lower RIGHT rib approximately tenth, and in the mid sternum worrisome for osseous metastases.   Electronically Signed  By: Lavonia Dana M.D.  On: 08/30/2013 15:04  ASSESSMENT and THERAPY PLAN:  Stage IV adenocarcinoma of the prostate Lupron therapy Rising PSA with value of 3.11 ng/ml on 09/10/2014   I am going to obtain his last PSA from his urologist's office. His PSA has risen on his check from 0.116 months ago  up to 3.11.  He does not appear to be on Xgeva for known bony metastatic disease. I would also like to check a baseline DEXA, he takes only one calcium tablet daily and his wife states he is not on any vitamin D.  If his PSA values truly elevated from prior we will discuss how to proceed. We can consider reimaging, weighting and rechecking a PSA in 3 months, or making a decision to add additional therapy such as Xtandi or Zytiga.  I will tentatively schedule him for 3 month follow-up. We may need to see him sooner pending his last PSA from his urologist.   Orders Placed This Encounter  Procedures  . DG Bone Density    Standing Status: Future     Number of Occurrences:      Standing Expiration Date: 09/11/2015    Order Specific Question:  Reason for Exam (SYMPTOM  OR DIAGNOSIS REQUIRED)    Answer:  high risk medication    Order Specific Question:  Preferred imaging location?    Answer:  [facility]  . CBC with Differential    Standing Status: Future     Number of Occurrences:      Standing Expiration Date: 09/11/2015  . Comprehensive metabolic panel    Standing Status: Future     Number of Occurrences:      Standing Expiration Date: 09/11/2015  . PSA    Standing Status: Future     Number of Occurrences:      Standing Expiration Date: 09/11/2015  . SCHEDULING COMMUNICATION INJECTION    All questions were answered. The patient knows to call the clinic with any problems, questions or concerns. We can certainly see  the patient much sooner if necessary.  This document serves as a record of services personally performed by Ancil Linsey, MD. It was created on her behalf by Pearlie Oyster, a trained medical scribe. The creation of this record is based on the scribe's personal observations and the provider's statements to them. This document has been checked and approved by the attending provider.    I have reviewed the above documentation for accuracy and completeness, and I agree with the above. This note was electronically signed. Kelby Fam. Elber Galyean MD

## 2014-09-18 ENCOUNTER — Ambulatory Visit (INDEPENDENT_AMBULATORY_CARE_PROVIDER_SITE_OTHER): Payer: Medicare Other | Admitting: Cardiology

## 2014-09-18 ENCOUNTER — Encounter: Payer: Self-pay | Admitting: Cardiology

## 2014-09-18 VITALS — BP 128/78 | HR 58 | Ht 70.5 in | Wt 188.0 lb

## 2014-09-18 DIAGNOSIS — I4891 Unspecified atrial fibrillation: Secondary | ICD-10-CM | POA: Diagnosis not present

## 2014-09-18 DIAGNOSIS — I10 Essential (primary) hypertension: Secondary | ICD-10-CM

## 2014-09-18 NOTE — Progress Notes (Signed)
Clinical Summary Mr. Frampton is a 81 y.o.male seen today for follow up of the following medical problems.   1. Atrial fibrillation   - denies any palpitations, he is rate controlled with diltiazem. Denies any bleeding issues on coumadin   2. HTN  -  checking bp's at home, typically 140/80s.  - compliant with meds  3. Prostate cancer with bone mets - followed by onc, currently on lupron Past Medical History  Diagnosis Date  . Hypertension   . Atrial fibrillation     Dr. Harl Bowie- [facility] follows saw 11'14  . 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 Margot Chimes, thus far without recurrence.   . NHL (non-Hodgkin's lymphoma) 08/18/2011    Diffuse large B-cell lymphoma, clinically stage IIIA, CD20 positive, status post cervical lymph node biopsy 08/03/2003 on the left. PET scan was also positive in the spleen and small bowel region, but bone marrow aspiration and biopsy were negative. So he essentially had stage IIIAs. He received R-CHOP x6 cycles with CR established by PET scan criteria on 11/23/2003 with no evidence for relapse th  . Cataracts, both eyes     surgery planned May 2015  . Bladder stones     tx. with oral meds and antibiotics, now surgery planned  . Diabetes mellitus without complication      No Known Allergies   Current Outpatient Prescriptions  Medication Sig Dispense Refill  . acetaminophen (TYLENOL) 500 MG tablet Take 500 mg by mouth every 6 (six) hours as needed for mild pain.     Marland Kitchen amitriptyline (ELAVIL) 25 MG tablet Take 25 mg by mouth at bedtime.    . Azelastine HCl (ASTEPRO) 0.15 % SOLN Place 2 sprays into the nose 2 (two) times daily as needed.    . bicalutamide (CASODEX) 50 MG tablet Take 50 mg by mouth daily.    .  Calcium Carb-Cholecalciferol 600-800 MG-UNIT TABS Take 1 capsule by mouth at bedtime.    . Cranberry 200 MG CAPS Take 4,200 mg by mouth every morning.     . diltiazem (DILACOR XR) 180 MG 24 hr capsule Take 1 capsule (180 mg total) by mouth every evening. 90 capsule 3  . Docusate Calcium (STOOL SOFTENER PO) Take 1 tablet by mouth 2 (two) times daily.     Marland Kitchen glipiZIDE (GLUCOTROL) 5 MG tablet Take by mouth daily before breakfast.    . linagliptin (TRADJENTA) 5 MG TABS tablet Take 5 mg by mouth at bedtime.     Marland Kitchen lisinopril-hydrochlorothiazide (PRINZIDE,ZESTORETIC) 20-25 MG per tablet Take 1 tablet by mouth daily. 90 tablet 3  . omeprazole (PRILOSEC) 20 MG capsule Take 20 mg by mouth daily.     . potassium chloride (K-DUR,KLOR-CON) 10 MEQ tablet Take 10 mEq by mouth daily.    . SENNA PO Take 1 capsule by mouth as needed.    . warfarin (COUMADIN) 2.5 MG tablet Take 2 tablets daily except 1 tablet on M,W,F 60 tablet 6   No current facility-administered medications for this visit.     Past Surgical History  Procedure Laterality Date  . Cholecystectomy    . Incision and drainage perirectal abscess    . Portacath placement      insertion and removal -last chemotherapy 10 yrs ago  . Lymph  node dissection Left     '05  . Port-a-cath removal    . Transurethral resection of prostate N/A 08/10/2013    Procedure: TRANSURETHRAL RESECTION OF THE PROSTATE WITH GYRUS INSTRUMENTS;  Surgeon: [name], MD;  Location: [facility];  Service: Urology;  Laterality: N/A;  . Cystoscopy with litholapaxy N/A 08/10/2013    Procedure: CYSTOSCOPY WITH LITHOLAPAXY WITH [name];  Surgeon: [name], MD;  Location: [facility];  Service: Urology;  Laterality: N/A;  . Cataract extraction Right 08/2013     No Known Allergies    No family history on file.   Social History Mr. Rickel reports that he has never smoked. He has never used smokeless tobacco. Mr. Tallon reports that he does not drink  alcohol.   Review of Systems CONSTITUTIONAL: No weight loss, fever, chills, weakness or fatigue.  HEENT: Eyes: No visual loss, blurred vision, double vision or yellow sclerae.No hearing loss, sneezing, congestion, runny nose or sore throat.  SKIN: No rash or itching.  CARDIOVASCULAR: per HPI RESPIRATORY: No shortness of breath, cough or sputum.  GASTROINTESTINAL: No anorexia, nausea, vomiting or diarrhea. No abdominal pain or blood.  GENITOURINARY: No burning on urination, no polyuria NEUROLOGICAL: No headache, dizziness, syncope, paralysis, ataxia, numbness or tingling in the extremities. No change in bowel or bladder control.  MUSCULOSKELETAL: No muscle, back pain, joint pain or stiffness.  LYMPHATICS: No enlarged nodes. No history of splenectomy.  PSYCHIATRIC: No history of depression or anxiety.  ENDOCRINOLOGIC: No reports of sweating, cold or heat intolerance. No polyuria or polydipsia.  Marland Kitchen   Physical Examination There were no vitals filed for this visit. There were no vitals filed for this visit.  Gen: resting comfortably, no acute distress HEENT: no scleral icterus, pupils equal round and reactive, no palptable cervical adenopathy,  CV Resp: Clear to auscultation bilaterally GI: abdomen is soft, non-tender, non-distended, normal bowel sounds, no hepatosplenomegaly MSK: extremities are warm, no edema.  Skin: warm, no rash Neuro:  no focal deficits Psych: appropriate affec     Assessment and Plan  1. Afib  - no current symptoms, continue current medications for rate control  - continue coumadin. Discussed NOACs, he prefers to remain on coumadin  2. HTN  - bp at goal, continue current meds  F/u 6 months. Request last labs from pcp      Arnoldo Lenis, M.D.

## 2014-09-18 NOTE — Patient Instructions (Signed)
Your physician wants you to follow-up in: 6 months with Dr.Branch You will receive a reminder letter in the mail two months in advance. If you don't receive a letter, please call our office to schedule the follow-up appointment.   Your physician recommends that you continue on your current medications as directed. Please refer to the Current Medication list given to you today.    Thank you for choosing [facility] !        

## 2014-09-19 ENCOUNTER — Ambulatory Visit (HOSPITAL_COMMUNITY)
Admission: RE | Admit: 2014-09-19 | Discharge: 2014-09-19 | Disposition: A | Discharge status: Home / Self Care | Payer: Medicare Other | Source: Ambulatory Visit | Attending: Hematology & Oncology | Admitting: Hematology & Oncology

## 2014-09-19 DIAGNOSIS — C61 Malignant neoplasm of prostate: Secondary | ICD-10-CM | POA: Diagnosis not present

## 2014-09-19 DIAGNOSIS — R29890 Loss of height: Secondary | ICD-10-CM | POA: Diagnosis not present

## 2014-09-19 DIAGNOSIS — Z79899 Other long term (current) drug therapy: Secondary | ICD-10-CM | POA: Diagnosis not present

## 2014-09-19 DIAGNOSIS — Z13820 Encounter for screening for osteoporosis: Secondary | ICD-10-CM | POA: Diagnosis not present

## 2014-09-20 ENCOUNTER — Other Ambulatory Visit (HOSPITAL_COMMUNITY): Payer: Medicare Other

## 2014-09-26 ENCOUNTER — Other Ambulatory Visit (HOSPITAL_COMMUNITY): Payer: Self-pay | Admitting: Hematology and Oncology

## 2014-09-26 ENCOUNTER — Ambulatory Visit (INDEPENDENT_AMBULATORY_CARE_PROVIDER_SITE_OTHER): Payer: Medicare Other | Admitting: *Deleted

## 2014-09-26 DIAGNOSIS — Z5181 Encounter for therapeutic drug level monitoring: Secondary | ICD-10-CM | POA: Diagnosis not present

## 2014-09-26 DIAGNOSIS — I4891 Unspecified atrial fibrillation: Secondary | ICD-10-CM | POA: Diagnosis not present

## 2014-09-26 DIAGNOSIS — Z7901 Long term (current) use of anticoagulants: Secondary | ICD-10-CM | POA: Diagnosis not present

## 2014-09-26 LAB — POCT INR: INR: 2.7

## 2014-09-28 ENCOUNTER — Encounter (HOSPITAL_COMMUNITY): Payer: Self-pay | Admitting: Hematology & Oncology

## 2014-09-28 ENCOUNTER — Encounter (HOSPITAL_COMMUNITY): Payer: Medicare Other | Attending: Hematology & Oncology | Admitting: Hematology & Oncology

## 2014-09-28 VITALS — BP 156/79 | HR 73 | Temp 97.7°F | Resp 18 | Wt 188.0 lb

## 2014-09-28 DIAGNOSIS — C7951 Secondary malignant neoplasm of bone: Secondary | ICD-10-CM | POA: Diagnosis not present

## 2014-09-28 DIAGNOSIS — I4891 Unspecified atrial fibrillation: Secondary | ICD-10-CM

## 2014-09-28 DIAGNOSIS — C61 Malignant neoplasm of prostate: Secondary | ICD-10-CM | POA: Diagnosis not present

## 2014-09-28 DIAGNOSIS — C8590 Non-Hodgkin lymphoma, unspecified, unspecified site: Secondary | ICD-10-CM | POA: Insufficient documentation

## 2014-09-28 NOTE — Patient Instructions (Addendum)
[place] at [facility] Discharge Instructions  RECOMMENDATIONS MADE BY THE CONSULTANT AND ANY TEST RESULTS WILL BE SENT TO YOUR REFERRING PHYSICIAN.  CT scans as scheduled. Return as scheduled for office visit in 2 weeks.   Thank you for choosing [place] [facility] to provide your oncology and hematology care.  To afford each patient quality time with our provider, please arrive at least 15 minutes before your scheduled appointment time.    You need to re-schedule your appointment should you arrive 10 or more minutes late.  We strive to give you quality time with our providers, and arriving late affects you and other patients whose appointments are after yours.  Also, if you no show three or more times for appointments you may be dismissed from the clinic at the providers discretion.     Again, thank you for choosing [facility].  Our hope is that these requests will decrease the amount of time that you wait before being seen by our physicians.       _____________________________________________________________  Should you have questions after your visit to [facility], please contact our office at [phone] between the hours of 8:30 a.m. and 4:30 p.m.  Voicemails left after 4:30 p.m. will not be returned until the following business day.  For prescription refill requests, have your pharmacy contact our office.

## 2014-09-28 NOTE — Progress Notes (Signed)
[name],[name], MD [place] / [place] [address]   DIAGNOSIS: NHL (non-Hodgkin's lymphoma)   Staging form: Lymphoid Neoplasms, AJCC 6th Edition     Clinical: Stage III - Signed by Baird Cancer, PA on 08/18/2011  1. 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 Significant vasomotor instability on full dose Casodex.  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 Margot Chimes in October 2005  #3. Diffuse large B-cell lymphoma clinically stage IIIAS STATUS post cervical lymph node biopsy on 08/03/2003. His lymphoma CD20 positive and PET scan showed positivity in the spleen. Bone marrow aspiration and biopsy were negative however. He received R. CHOP x6 cycles with a complete remission established by PET scan criteria on 11/23/2003. He completed 6 full cycles total. He remains without disease recurrence. He remains free of B. ymptomatology.   CURRENT THERAPY: Lupron every 6 months/casodex  INTERVAL HISTORY: [name] 81 y.o. male returns for follow up.   He is here today with his wife.  He stays active and is outdoors often.  He is eating well. He is here today secondary to PSA change noted at his last visit. He has no new complaints.   MEDICAL HISTORY: Past Medical History  Diagnosis Date  . Hypertension   . Atrial fibrillation     Dr. Harl Bowie- [facility] follows saw 11'14  . 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 Margot Chimes, thus far without recurrence.   . NHL (non-Hodgkin's lymphoma) 08/18/2011    Diffuse large B-cell lymphoma, clinically stage IIIA, CD20 positive, status post cervical  lymph node biopsy 08/03/2003 on the left. PET scan was also positive in the spleen and small bowel region, but bone marrow aspiration and biopsy were negative. So he essentially had stage IIIAs. He received R-CHOP x6 cycles with CR established by PET scan criteria on 11/23/2003 with no evidence for relapse th  . Cataracts, both eyes     surgery planned May 2015  . Bladder stones     tx. with oral meds and antibiotics, now surgery planned  . Diabetes mellitus without complication     has HYPERTENSION, UNSPECIFIED; ATRIAL FIBRILLATION; Long term (current) use of anticoagulants; NHL (non-Hodgkin's lymphoma); GIST (gastrointestinal stromal tumor), malignant; Encounter for therapeutic drug monitoring; Hypertrophy of prostate with urinary obstruction and other lower urinary tract symptoms (LUTS); and Prostate cancer on his problem list.     has No Known Allergies.  Current Outpatient Prescriptions on File Prior to Visit  Medication Sig Dispense Refill  . acetaminophen (TYLENOL) 500 MG tablet Take 500 mg by mouth every 6 (six) hours as needed for mild pain.     Marland Kitchen amitriptyline (ELAVIL) 25 MG tablet Take 25 mg by mouth at bedtime.    . Calcium Carb-Cholecalciferol 600-800 MG-UNIT TABS Take 1 capsule by mouth at bedtime.    . Cranberry 200 MG CAPS Take 4,200 mg by mouth every morning.     . diltiazem (DILACOR XR) 180 MG 24 hr capsule Take 1 capsule (180 mg total) by mouth every evening. 90 capsule 3  . Docusate Calcium (STOOL SOFTENER PO) Take 1 tablet by mouth 2 (two) times  daily.     . glipiZIDE (GLUCOTROL) 5 MG tablet Take by mouth daily before breakfast.    . linagliptin (TRADJENTA) 5 MG TABS tablet Take 5 mg by mouth at bedtime.     Marland Kitchen lisinopril-hydrochlorothiazide (PRINZIDE,ZESTORETIC) 20-25 MG per tablet Take 1 tablet by mouth daily. 90 tablet 3  . omeprazole (PRILOSEC) 20 MG capsule Take 20 mg by mouth daily.     . potassium chloride (K-DUR,KLOR-CON) 10 MEQ tablet Take 10 mEq by mouth daily.     . SENNA PO Take 1 capsule by mouth as needed.    . warfarin (COUMADIN) 2.5 MG tablet Take 2 tablets daily except 1 tablet on M,W,F 60 tablet 6   No current facility-administered medications on file prior to visit.     SURGICAL HISTORY: Past Surgical History  Procedure Laterality Date  . Cholecystectomy    . Incision and drainage perirectal abscess    . Portacath placement      insertion and removal -last chemotherapy 10 yrs ago  . Lymph node dissection Left     '05  . Port-a-cath removal    . Transurethral resection of prostate N/A 08/10/2013    Procedure: TRANSURETHRAL RESECTION OF THE PROSTATE WITH GYRUS INSTRUMENTS;  Surgeon: [name], MD;  Location: [facility];  Service: Urology;  Laterality: N/A;  . Cystoscopy with litholapaxy N/A 08/10/2013    Procedure: CYSTOSCOPY WITH LITHOLAPAXY WITH [name];  Surgeon: [name], MD;  Location: [facility];  Service: Urology;  Laterality: N/A;  . Cataract extraction Right 08/2013    SOCIAL HISTORY: History   Social History  . Marital Status: Married    Spouse Name: N/A  . Number of Children: N/A  . Years of Education: N/A   Occupational History  . retired     Hotel manager   Social History Main Topics  . Smoking status: Never Smoker   . Smokeless tobacco: Never Used  . Alcohol Use: No  . Drug Use: No  . Sexual Activity: Not Currently   Other Topics Concern  . Not on file   Social History Narrative  He has 8 grandchildren. He takes care of his garden and mows his lawn. 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  FAMILY HISTORY: No family history on file.   Father died of pneumonia when the patient was 71 year of age Mother died of MI at 53 All of his siblings are deceased  Review of Systems  Constitutional: Negative for fever, chills, weight loss and malaise/fatigue.  HENT: Negative for congestion, hearing loss, nosebleeds, sore throat and tinnitus.   Eyes: Negative for blurred vision, double vision, pain and discharge.    Respiratory: Negative for cough, hemoptysis, sputum production, shortness of breath and wheezing.   Cardiovascular: Negative for chest pain, palpitations, claudication, leg swelling and PND.  Gastrointestinal: Negative for heartburn, nausea, vomiting, abdominal pain, diarrhea, constipation, blood in stool and melena.  Genitourinary: Negative for dysuria, urgency, frequency and hematuria.  Musculoskeletal: Negative for myalgias, joint pain and falls.  Skin: Negative for itching and rash.  Neurological: Negative for dizziness, tingling, tremors, sensory change, speech change, focal weakness, seizures, loss of consciousness, weakness and headaches.  Endo/Heme/Allergies: Does not bruise/bleed easily.  Psychiatric/Behavioral: Negative for depression, suicidal ideas, memory loss and substance abuse. The patient is not nervous/anxious and does not have insomnia.   He is without complaints   PHYSICAL EXAMINATION  ECOG PERFORMANCE STATUS: 0 - Asymptomatic  There were no vitals filed for this visit.  Physical Exam  Constitutional: He is  oriented to person, place, and time and well-developed, well-nourished, and in no distress.  HENT:  Head: Normocephalic and atraumatic.  Nose: Nose normal.  Mouth/Throat: Oropharynx is clear and moist. No oropharyngeal exudate.  Eyes: Conjunctivae and EOM are normal. Pupils are equal, round, and reactive to light. Right eye exhibits no discharge. Left eye exhibits no discharge. No scleral icterus.  Neck: Normal range of motion. Neck supple. No tracheal deviation present. No thyromegaly present.  Cardiovascular: Normal rate, regular rhythm and normal heart sounds.  Exam reveals no gallop and no friction rub.   No murmur heard. Pulmonary/Chest: Effort normal and breath sounds normal. He has no wheezes. He has no rales.  Abdominal: Soft. Bowel sounds are normal. He exhibits no distension and no mass. There is no tenderness. There is no rebound and no guarding.   Musculoskeletal: Normal range of motion. He exhibits no edema.  Lymphadenopathy:    He has no cervical adenopathy.  Neurological: He is alert and oriented to person, place, and time. He has normal reflexes. No cranial nerve deficit. Gait normal. Coordination normal.  Skin: Skin is warm and dry. No rash noted.  Psychiatric: Mood, memory, affect and judgment normal.  Nursing note and vitals reviewed.   LABORATORY DATA:  CBC    Component Value Date/Time   WBC 6.0 09/07/2014 1105   RBC 4.86 09/07/2014 1105   RBC 4.77 09/08/2013 1047   HGB 15.0 09/07/2014 1105   HCT 43.1 09/07/2014 1105   PLT 191 09/07/2014 1105   MCV 88.7 09/07/2014 1105   MCH 30.9 09/07/2014 1105   MCHC 34.8 09/07/2014 1105   RDW 13.2 09/07/2014 1105   LYMPHSABS 2.2 09/07/2014 1105   MONOABS 0.6 09/07/2014 1105   EOSABS 0.1 09/07/2014 1105   BASOSABS 0.0 09/07/2014 1105   CMP     Component Value Date/Time   NA 140 09/07/2014 1105   K 3.9 09/07/2014 1105   CL 102 09/07/2014 1105   CO2 30 09/07/2014 1105   GLUCOSE 147* 09/07/2014 1105   BUN 19 09/07/2014 1105   CREATININE 0.91 09/07/2014 1105   CREATININE 1.14 07/27/2013 1004   CALCIUM 9.8 09/07/2014 1105   PROT 7.4 09/07/2014 1105   ALBUMIN 4.3 09/07/2014 1105   AST 21 09/07/2014 1105   ALT 18 09/07/2014 1105   ALKPHOS 81 09/07/2014 1105   BILITOT 0.9 09/07/2014 1105   GFRNONAA >60 09/07/2014 1105   GFRNONAA 58* 07/27/2013 1004   GFRAA >60 09/07/2014 1105   GFRAA 66 07/27/2013 1004    RADIOLOGY:  EXAM: DUAL X-RAY ABSORPTIOMETRY (DXA) FOR BONE MINERAL DENSITY  IMPRESSION: Ordering Physician: Dr. Patrici Ranks,  Your patient [name] completed a BMD test on 09/19/2014 using the [employer] (software version: 14.10) manufactured by [employer]. The following summarizes the results of our evaluation. PATIENT BIOGRAPHICAL: Name: [name], [name] Patient ID: [id number] Birth Date: [birth year] Height: 70.5  in. Gender: Male Exam Date: 09/19/2014 Weight: 188.0 lbs. Indications: Caucasian, Height Loss, Low Calcium Intake, Prostate Cancer Fractures: Treatments: DENSITOMETRY RESULTS: Site Region Measured Date Measured Age WHO Classification Young Adult T-score BMD %Change vs. Previous Significant Change (*) DualFemur Neck Right 09[age]19/2014 88.6 N[age]A -0.7 0.975 g/cm2  Left Forearm Radius 33% 09/19/2014 88.6 N[age]A -0.5 0.767 g/cm2 ASSESSMENT: BMD as determined from Femur Neck Right is 0.975 g/cm2 with a T-Score of -0.7. This patient is considered normal by World [employer]al[employer] [facility]) Criteria. (Lumbar spine was not utilized due to advanced degenerative changes.) (Patient does  not meet criteria for FRAX assessment.)  World Health Organization [facility]) criteria for post-menopausal, Caucasian Women: Normal: T-score at or above -1 SD Osteopenia: T-score between -1 and -2.5 SD Osteoporosis: T-score at or below -2.5 SD  RECOMMENDATIONS: [place] recommends that FDA-approved medial therapies be considered in postmenopausal women and men age 26 or older with a: 1. Hip or vertberal (clinical or morphometric) fracture. 2. T-Score of < -2.5 at the spine or hip. 3. Ten-year fracture probability by FRAX of 3% or greater for hip fracture or 20% or greater for major osteoporotic fracture.  All treatment decisions require clinical judgment and consideration of indiviual patient factors, including patient preferences, co-morbidities, previous drug use, risk factors not captured in the FRAX model (e.g. falls, vitamin D deficiency, increased bone turnover, interval significant decline in bone density) and possible under-or over-estimation of fracture risk by FRAX.  All patients should ensure an adequate intake of dietary calcium (1200 mg/d) and vitamin D (800 IU daily) unless contraindicated.  FOLLOW-UP: People with diagnosed cases of  osteoporosis or osteopenia should be regularly tested for bone mineral density. For patients eligible for Medicare, routine testing is allowed once every 2 years. Testing frequency can be increased for patients who have rapidly progressing disease, or for those who are receiving medical therapy to restore bone mass.  I have reviewed this report, and agree with the above findings.  [facility]   Electronically Signed  By: Rolm Baptise M.D.  On: 09/19/2014 09:42   CLINICAL DATA: Prostate cancer  EXAM: NUCLEAR MEDICINE WHOLE BODY BONE SCAN IMPRESSION: Abnormal foci of increased tracer localization in the lumbar spine, posterior lower RIGHT rib approximately tenth, and in the mid sternum worrisome for osseous metastases.   Electronically Signed  By: Lavonia Dana M.D.  On: 08/30/2013 15:04  ASSESSMENT and THERAPY PLAN:  Stage IV adenocarcinoma of the prostate Lupron therapy/Casodex Rising PSA with value of 3.11 ng/ml on 09/10/2014  DEXA with normal bone density  I discussed with the patient and his wife the change in his PSA. I reviewed with them the N CCN guidelines. I have recommended discontinuing Casodex. We will see how his PSA does over the next several months. We also have other options and I briefly discussed these with them today. I have recommended repeat imaging including a bone scan and CT imaging.  I reviewed his DEXA in advised them he has normal bone density. He is to continue taking daily calcium and vitamin D. He is not on Xgeva for his bony metastatic disease and we will address this at his follow-up when we review his restaging studies.  Orders Placed This Encounter  Procedures  . NM Bone Scan Whole Body    Standing Status: Future     Number of Occurrences: 1     Standing Expiration Date: 09/28/2015    Order Specific Question:  Reason for Exam (SYMPTOM  OR DIAGNOSIS REQUIRED)    Answer:  prostate cancer, rising psa    Order  Specific Question:  Preferred imaging location?    Answer:  [facility]  . CT Abdomen Pelvis W Contrast    Standing Status: Future     Number of Occurrences: 1     Standing Expiration Date: 09/28/2015    Order Specific Question:  Reason for Exam (SYMPTOM  OR DIAGNOSIS REQUIRED)    Answer:  prostate cancer, rising psa    Order Specific Question:  Preferred imaging location?    Answer:  [facility]  .  CT Chest W Contrast    Standing Status: Future     Number of Occurrences: 1     Standing Expiration Date: 09/28/2015    Order Specific Question:  Reason for Exam (SYMPTOM  OR DIAGNOSIS REQUIRED)    Answer:  prostate cancer, rising psa    Order Specific Question:  Preferred imaging location?    Answer:  [facility]  . Vitamin D 25 hydroxy    Standing Status: Future     Number of Occurrences:      Standing Expiration Date: 09/28/2015  . PSA    Standing Status: Future     Number of Occurrences:      Standing Expiration Date: 09/28/2015     All questions were answered. The patient knows to call the clinic with any problems, questions or concerns. We can certainly see the patient much sooner if necessary. //  This document serves as a record of services personally performed by Ancil Linsey, MD. It was created on her behalf by Janace Hoard, a trained medical scribe. The creation of this record is based on the scribe's personal observations and the provider's statements to them. This document has been checked and approved by the attending provider.  I have reviewed the above documentation for accuracy and completeness, and I agree with the above. This note was electronically signed. Kelby Fam. Sila Sarsfield MD

## 2014-10-03 DIAGNOSIS — H26493 Other secondary cataract, bilateral: Secondary | ICD-10-CM | POA: Diagnosis not present

## 2014-10-05 ENCOUNTER — Encounter (HOSPITAL_COMMUNITY)
Admission: RE | Admit: 2014-10-05 | Discharge: 2014-10-05 | Disposition: A | Discharge status: Home / Self Care | Payer: Medicare Other | Source: Ambulatory Visit | Attending: Hematology & Oncology | Admitting: Hematology & Oncology

## 2014-10-05 ENCOUNTER — Encounter (HOSPITAL_COMMUNITY): Payer: Self-pay

## 2014-10-05 DIAGNOSIS — C61 Malignant neoplasm of prostate: Secondary | ICD-10-CM | POA: Diagnosis not present

## 2014-10-05 DIAGNOSIS — N323 Diverticulum of bladder: Secondary | ICD-10-CM | POA: Diagnosis not present

## 2014-10-05 DIAGNOSIS — Z8546 Personal history of malignant neoplasm of prostate: Secondary | ICD-10-CM | POA: Diagnosis not present

## 2014-10-05 DIAGNOSIS — C7951 Secondary malignant neoplasm of bone: Secondary | ICD-10-CM | POA: Diagnosis not present

## 2014-10-05 DIAGNOSIS — R911 Solitary pulmonary nodule: Secondary | ICD-10-CM | POA: Diagnosis not present

## 2014-10-05 DIAGNOSIS — C8590 Non-Hodgkin lymphoma, unspecified, unspecified site: Secondary | ICD-10-CM | POA: Diagnosis not present

## 2014-10-05 DIAGNOSIS — I10 Essential (primary) hypertension: Secondary | ICD-10-CM | POA: Diagnosis not present

## 2014-10-05 MED ORDER — TECHNETIUM TC 99M MEDRONATE IV KIT
25.0000 | PACK | Freq: Once | INTRAVENOUS | Status: AC | PRN
  Administered 2014-10-05: 24 via INTRAVENOUS

## 2014-10-05 MED ORDER — IOHEXOL 300 MG/ML  SOLN
100.0000 mL | Freq: Once | INTRAMUSCULAR | Status: AC | PRN
  Administered 2014-10-05: 100 mL via INTRAVENOUS

## 2014-10-09 ENCOUNTER — Encounter (HOSPITAL_COMMUNITY): Payer: Self-pay | Admitting: Hematology & Oncology

## 2014-10-09 ENCOUNTER — Encounter (HOSPITAL_BASED_OUTPATIENT_CLINIC_OR_DEPARTMENT_OTHER): Payer: Medicare Other | Admitting: Hematology & Oncology

## 2014-10-09 ENCOUNTER — Ambulatory Visit (HOSPITAL_COMMUNITY): Payer: Medicare Other | Admitting: Hematology & Oncology

## 2014-10-09 VITALS — BP 146/98 | HR 56 | Temp 97.8°F | Resp 20 | Wt 189.8 lb

## 2014-10-09 DIAGNOSIS — C61 Malignant neoplasm of prostate: Secondary | ICD-10-CM

## 2014-10-09 DIAGNOSIS — Z8572 Personal history of non-Hodgkin lymphomas: Secondary | ICD-10-CM

## 2014-10-09 DIAGNOSIS — C7951 Secondary malignant neoplasm of bone: Secondary | ICD-10-CM | POA: Diagnosis not present

## 2014-10-09 DIAGNOSIS — E1342 Other specified diabetes mellitus with diabetic polyneuropathy: Secondary | ICD-10-CM | POA: Diagnosis not present

## 2014-10-09 DIAGNOSIS — B351 Tinea unguium: Secondary | ICD-10-CM | POA: Diagnosis not present

## 2014-10-09 NOTE — Progress Notes (Signed)
[name],[name], MD [place] / [place] [address]   DIAGNOSIS: NHL (non-Hodgkin's lymphoma)   Staging form: Lymphoid Neoplasms, AJCC 6th Edition     Clinical: Stage III - Signed by Baird Cancer, PA on 08/18/2011  1. 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 Significant vasomotor instability on full dose Casodex.  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 Margot Chimes in October 2005  #3. Diffuse large B-cell lymphoma clinically stage IIIAS STATUS post cervical lymph node biopsy on 08/03/2003. His lymphoma CD20 positive and PET scan showed positivity in the spleen. Bone marrow aspiration and biopsy were negative however. He received R. CHOP x6 cycles with a complete remission established by PET scan criteria on 11/23/2003. He completed 6 full cycles total. He remains without disease recurrence. He remains free of B. ymptomatology.   CURRENT THERAPY: Lupron every 6 months/casodex  INTERVAL HISTORY: [name] 81 y.o. male returns for follow up.   He is feeling well and is here today with his wife and daughter.  He has been experiencing hot flashes but isn't interested in taking medication to alleviate these symptoms at this time. He says he wakes up 3x per night having to "kick the covers off and pull them back up". . He was already taking 2000 units calcium per day and just began taking Vitamin D recently.  His wife mentions he experiences leg and back pain sometimes at night. She was curious if this was due to the cancer.   They are here today to review repeat imaging studies and for additional recommendations in regards to his prostate cancer and rising PSA.  MEDICAL HISTORY: Past Medical History  Diagnosis Date  . Hypertension   . Atrial fibrillation     Dr. Harl Bowie- [facility] follows saw 11'14  . 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 Margot Chimes, thus far without recurrence.   . NHL (non-Hodgkin's lymphoma) 08/18/2011    Diffuse large B-cell lymphoma, clinically stage IIIA, CD20 positive, status post cervical lymph node biopsy 08/03/2003 on the left. PET scan was also positive in the spleen and small bowel region, but bone marrow aspiration and biopsy were negative. So he essentially had stage IIIAs. He received R-CHOP x6 cycles with CR established by PET scan criteria on 11/23/2003 with no evidence for relapse th  . Cataracts, both eyes     surgery planned May 2015  . Bladder stones     tx. with oral meds and antibiotics, now surgery planned  . Diabetes mellitus without complication     has HYPERTENSION, UNSPECIFIED; ATRIAL FIBRILLATION; Long term (current) use of anticoagulants; NHL (non-Hodgkin's lymphoma); GIST (gastrointestinal stromal tumor), malignant; Encounter for therapeutic drug monitoring; Hypertrophy of prostate with urinary obstruction and other lower urinary tract symptoms (LUTS); and Prostate cancer on his problem list.     has No Known Allergies.  Current Outpatient Prescriptions on File Prior to Visit  Medication Sig Dispense Refill  . acetaminophen (TYLENOL) 500 MG tablet Take 500 mg by mouth every 6 (six) hours as needed for mild pain.     Marland Kitchen amitriptyline (ELAVIL) 25 MG tablet Take 25  mg by mouth at bedtime.    . bicalutamide (CASODEX) 50 MG tablet Take 25 mg by mouth daily.    . Calcium Carb-Cholecalciferol 600-800 MG-UNIT TABS Take 1 capsule by mouth at bedtime.    . Cranberry 200 MG CAPS Take 4,200 mg by mouth every morning.     . diltiazem (DILACOR XR) 180 MG 24 hr capsule Take 1 capsule (180 mg total) by mouth every evening. 90 capsule  3  . Docusate Calcium (STOOL SOFTENER PO) Take 1 tablet by mouth 2 (two) times daily.     Marland Kitchen glipiZIDE (GLUCOTROL) 5 MG tablet Take by mouth daily before breakfast.    . linagliptin (TRADJENTA) 5 MG TABS tablet Take 5 mg by mouth at bedtime.     Marland Kitchen lisinopril-hydrochlorothiazide (PRINZIDE,ZESTORETIC) 20-25 MG per tablet Take 1 tablet by mouth daily. 90 tablet 3  . omeprazole (PRILOSEC) 20 MG capsule Take 20 mg by mouth daily.     . potassium chloride (K-DUR,KLOR-CON) 10 MEQ tablet Take 10 mEq by mouth daily.    . SENNA PO Take 1 capsule by mouth as needed.    . warfarin (COUMADIN) 2.5 MG tablet Take 2 tablets daily except 1 tablet on M,W,F 60 tablet 6   No current facility-administered medications on file prior to visit.    SURGICAL HISTORY: Past Surgical History  Procedure Laterality Date  . Cholecystectomy    . Incision and drainage perirectal abscess    . Portacath placement      insertion and removal -last chemotherapy 10 yrs ago  . Lymph node dissection Left     '05  . Port-a-cath removal    . Transurethral resection of prostate N/A 08/10/2013    Procedure: TRANSURETHRAL RESECTION OF THE PROSTATE WITH GYRUS INSTRUMENTS;  Surgeon: [name], MD;  Location: [facility];  Service: Urology;  Laterality: N/A;  . Cystoscopy with litholapaxy N/A 08/10/2013    Procedure: CYSTOSCOPY WITH LITHOLAPAXY WITH [name];  Surgeon: [name], MD;  Location: [facility];  Service: Urology;  Laterality: N/A;  . Cataract extraction Right 08/2013    SOCIAL HISTORY: History   Social History  . Marital Status: Married    Spouse Name: N/A  . Number of Children: N/A  . Years of Education: N/A   Occupational History  . retired     Hotel manager   Social History Main Topics  . Smoking status: Never Smoker   . Smokeless tobacco: Never Used  . Alcohol Use: No  . Drug Use: No  . Sexual Activity: Not Currently   Other Topics Concern  . Not on file   Social History Narrative  He has 8  grandchildren. He takes care of his garden and mows his lawn. 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  FAMILY HISTORY: No family history on file.   Father died of pneumonia when the patient was 57 year of age Mother died of MI at 96 All of his siblings are deceased  Review of Systems  Constitutional: Positive for hot flashes. Negative for fever, chills, weight loss and malaise/fatigue.  HENT: Negative for congestion, hearing loss, nosebleeds, sore throat and tinnitus.   Eyes: Negative for blurred vision, double vision, pain and discharge.  Respiratory: Negative for cough, hemoptysis, sputum production, shortness of breath and wheezing.   Cardiovascular: Negative for chest pain, palpitations, claudication, leg swelling and PND.  Gastrointestinal: Negative for heartburn, nausea, vomiting, abdominal pain, diarrhea, constipation, blood in stool and melena.  Genitourinary: Negative for dysuria, urgency, frequency and hematuria.  Musculoskeletal: Negative  for myalgias, joint pain and falls.  Skin: Negative for itching and rash.  Neurological: Negative for dizziness, tingling, tremors, sensory change, speech change, focal weakness, seizures, loss of consciousness, weakness and headaches.  Endo/Heme/Allergies: Does not bruise/bleed easily.  Psychiatric/Behavioral: Negative for depression, suicidal ideas, memory loss and substance abuse. The patient is not nervous/anxious and does not have insomnia.     PHYSICAL EXAMINATION  ECOG PERFORMANCE STATUS: 0 - Asymptomatic  There were no vitals filed for this visit.  Physical Exam  Constitutional: He is oriented to person, place, and time and well-developed, well-nourished, and in no distress.  HENT:  Head: Normocephalic and atraumatic.  Nose: Nose normal.  Mouth/Throat: Oropharynx is clear and moist. No oropharyngeal exudate.  Eyes: Conjunctivae and EOM are normal. Pupils are equal, round, and reactive to light. Right eye exhibits no discharge. Left eye exhibits  no discharge. No scleral icterus.  Neck: Normal range of motion. Neck supple. No tracheal deviation present. No thyromegaly present.  Cardiovascular: Normal rate, regular rhythm and normal heart sounds.  Exam reveals no gallop and no friction rub.   No murmur heard. Pulmonary/Chest: Effort normal and breath sounds normal. He has no wheezes. He has no rales.  Abdominal: Soft. Bowel sounds are normal. He exhibits no distension and no mass. There is no tenderness. There is no rebound and no guarding.  Musculoskeletal: Normal range of motion. He exhibits no edema.  Lymphadenopathy:    He has no cervical adenopathy.  Neurological: He is alert and oriented to person, place, and time. He has normal reflexes. No cranial nerve deficit. Gait normal. Coordination normal.  Skin: Skin is warm and dry. No rash noted.  Psychiatric: Mood, memory, affect and judgment normal.  Nursing note and vitals reviewed.   LABORATORY DATA:  CBC    Component Value Date/Time   WBC 6.0 09/07/2014 1105   RBC 4.86 09/07/2014 1105   RBC 4.77 09/08/2013 1047   HGB 15.0 09/07/2014 1105   HCT 43.1 09/07/2014 1105   PLT 191 09/07/2014 1105   MCV 88.7 09/07/2014 1105   MCH 30.9 09/07/2014 1105   MCHC 34.8 09/07/2014 1105   RDW 13.2 09/07/2014 1105   LYMPHSABS 2.2 09/07/2014 1105   MONOABS 0.6 09/07/2014 1105   EOSABS 0.1 09/07/2014 1105   BASOSABS 0.0 09/07/2014 1105   CMP     Component Value Date/Time   NA 140 09/07/2014 1105   K 3.9 09/07/2014 1105   CL 102 09/07/2014 1105   CO2 30 09/07/2014 1105   GLUCOSE 147* 09/07/2014 1105   BUN 19 09/07/2014 1105   CREATININE 0.91 09/07/2014 1105   CREATININE 1.14 07/27/2013 1004   CALCIUM 9.8 09/07/2014 1105   PROT 7.4 09/07/2014 1105   ALBUMIN 4.3 09/07/2014 1105   AST 21 09/07/2014 1105   ALT 18 09/07/2014 1105   ALKPHOS 81 09/07/2014 1105   BILITOT 0.9 09/07/2014 1105   GFRNONAA >60 09/07/2014 1105   GFRNONAA 58* 07/27/2013 1004   GFRAA >60 09/07/2014 1105    GFRAA 66 07/27/2013 1004    RADIOLOGY:  EXAM: DUAL X-RAY ABSORPTIOMETRY (DXA) FOR BONE MINERAL DENSITY  IMPRESSION: Ordering Physician: Dr. Patrici Ranks,  Your patient ABDULKARIM EBERLIN completed a BMD test on 09/19/2014 using the [employer] (software version: 14.10) manufactured by [employer]. The following summarizes the results of our evaluation. PATIENT BIOGRAPHICAL: Name: [name], [name] Patient ID: [id number] Birth Date: 1923/09/02 Height: 70.5 in. Gender: Male Exam Date: 09/19/2014 Weight: 188.0 lbs.  Indications: Caucasian, Height Loss, Low Calcium Intake, Prostate Cancer Fractures: Treatments: DENSITOMETRY RESULTS: Site Region Measured Date Measured Age WHO Classification Young Adult T-score BMD %Change vs. Previous Significant Change (*) DualFemur Neck Right 09[age]19/2014 88.6 N[age]A -0.7 0.975 g/cm2  Left Forearm Radius 33% 09/19/2014 88.6 N[age]A -0.5 0.767 g/cm2 ASSESSMENT: BMD as determined from Femur Neck Right is 0.975 g/cm2 with a T-Score of -0.7. This patient is considered normal by World [facility]) Criteria. (Lumbar spine was not utilized due to advanced degenerative changes.) (Patient does not meet criteria for FRAX assessment.)  World Health Organization [facility]) criteria for post-menopausal, Caucasian Women: Normal: T-score at or above -1 SD Osteopenia: T-score between -1 and -2.5 SD Osteoporosis: T-score at or below -2.5 SD  RECOMMENDATIONS: [facility] recommends that FDA-approved medial therapies be considered in postmenopausal women and men age 43 or older with a: 1. Hip or vertberal (clinical or morphometric) fracture. 2. T-Score of < -2.5 at the spine or hip. 3. Ten-year fracture probability by FRAX of 3% or greater for hip fracture or 20% or greater for major osteoporotic fracture.  All treatment decisions require clinical judgment and  consideration of indiviual patient factors, including patient preferences, co-morbidities, previous drug use, risk factors not captured in the FRAX model (e.g. falls, vitamin D deficiency, increased bone turnover, interval significant decline in bone density) and possible under-or over-estimation of fracture risk by FRAX.  All patients should ensure an adequate intake of dietary calcium (1200 mg/d) and vitamin D (800 IU daily) unless contraindicated.  FOLLOW-UP: People with diagnosed cases of osteoporosis or osteopenia should be regularly tested for bone mineral density. For patients eligible for Medicare, routine testing is allowed once every 2 years. Testing frequency can be increased for patients who have rapidly progressing disease, or for those who are receiving medical therapy to restore bone mass.  I have reviewed this report, and agree with the above findings.  [facility]   Electronically Signed  By: Rolm Baptise M.D.  On: 09/19/2014 09:42    CLINICAL DATA: Prostate cancer diagnosed in 2005 with recent bladder stones. Rising PSA. Diabetes. Hypertension. Lymphoma in 2013. Gastrointestinal stromal tumor in 2013.  EXAM: CT CHEST, ABDOMEN, AND PELVIS WITH CONTRAST IMPRESSION: 1. Osseous metastasis. When correlated with today's bone scan, felt to be slightly progressive. 2. No evidence of extraosseous metastasis. 3. Esophageal air fluid level suggests dysmotility or gastroesophageal reflux. 4. Small bladder saccules, suggesting a component of outlet obstruction.   Electronically Signed  By: Abigail Miyamoto M.D.  On: 10/05/2014 16:13    ASSESSMENT and THERAPY PLAN:  Stage IV adenocarcinoma of the prostate Lupron therapy/Casodex Rising PSA with value of 3.11 ng/ml on 09/10/2014  DEXA with normal bone density  Imaging studies were reviewed with the patient and his family. I discussed with the patient and his wife the change in his  PSA. I reviewed his bone scan suggesting slight progression of disease.  I reviewed with them the N CCN guidelines. I have recommended discontinuing Casodex. We will see how his PSA does over the next several months. We also have other options and I briefly discussed these with them today, including XTANDI and ZYTIGA.  I reviewed his DEXA in advised them he has normal bone density. He is to continue taking daily calcium and vitamin D. He is not on Xgeva for his bony metastatic disease and we discussed this in detail today. Risks and benefits of the medication were reviewed including osteonecrosis of the jaw and the benefit  of delaying time to bony complications.  Orders Placed This Encounter  Procedures  . CBC with Differential    Standing Status: Future     Number of Occurrences:      Standing Expiration Date: 10/09/2015  . Comprehensive metabolic panel    Standing Status: Future     Number of Occurrences:      Standing Expiration Date: 10/09/2015  . PSA    Standing Status: Future     Number of Occurrences:      Standing Expiration Date: 10/09/2015  . Vitamin D 25 hydroxy    Standing Status: Future     Number of Occurrences:      Standing Expiration Date: 10/09/2015    All questions were answered. The patient knows to call the clinic with any problems, questions or concerns. We can certainly see the patient much sooner if necessary. //  This document serves as a record of services personally performed by Ancil Linsey, MD. It was created on her behalf by Arlyce Harman, a trained medical scribe. The creation of this record is based on the scribe's personal observations and the provider's statements to them. This document has been checked and approved by the attending provider.  I have reviewed the above documentation for accuracy and completeness, and I agree with the above.  This note was electronically signed. Kelby Fam. Penland MD

## 2014-10-09 NOTE — Patient Instructions (Signed)
[place] at [facility] Discharge Instructions  RECOMMENDATIONS MADE BY THE CONSULTANT AND ANY TEST RESULTS WILL BE SENT TO YOUR REFERRING PHYSICIAN.  Exam and discussion of test results by Dr. Whitney Muse. Plans are to start you on Xgev to help strengthen your bones and reduce your risk of fractures. Continue to stay off the Casodex.  Delton See is given monthly and we have to check blood work prior to its adminstration. Will recheck you PSA and have you see Dr. Whitney Muse in 2 months to see how you are doing.    Thank you for choosing [place] at [facility] to provide your oncology and hematology care.  To afford each patient quality time with our provider, please arrive at least 15 minutes before your scheduled appointment time.    You need to re-schedule your appointment should you arrive 10 or more minutes late.  We strive to give you quality time with our providers, and arriving late affects you and other patients whose appointments are after yours.  Also, if you no show three or more times for appointments you may be dismissed from the clinic at the providers discretion.     Again, thank you for choosing [facility].  Our hope is that these requests will decrease the amount of time that you wait before being seen by our physicians.       _____________________________________________________________  Should you have questions after your visit to [facility], please contact our office at [phone] between the hours of 8:30 a.m. and 4:30 p.m.  Voicemails left after 4:30 p.m. will not be returned until the following business day.  For prescription refill requests, have your pharmacy contact our office.    Denosumab injection What is this medicine? DENOSUMAB (den oh sue mab) slows bone breakdown. Prolia is used to treat osteoporosis in women after menopause and in men. Delton See is used to prevent bone fractures and other  bone problems caused by cancer bone metastases. Delton See is also used to treat giant cell tumor of the bone. This medicine may be used for other purposes; ask your health care provider or pharmacist if you have questions. COMMON BRAND NAME(S): Prolia, XGEVA What should I tell my health care provider before I take this medicine? They need to know if you have any of these conditions: -dental disease -eczema -infection or history of infections -kidney disease or on dialysis -low blood calcium or vitamin D -malabsorption syndrome -scheduled to have surgery or tooth extraction -taking medicine that contains denosumab -thyroid or parathyroid disease -an unusual reaction to denosumab, other medicines, foods, dyes, or preservatives -pregnant or trying to get pregnant -breast-feeding How should I use this medicine? This medicine is for injection under the skin. It is given by a health care professional in a hospital or clinic setting. If you are getting Prolia, a special MedGuide will be given to you by the pharmacist with each prescription and refill. Be sure to read this information carefully each time. For Prolia, talk to your pediatrician regarding the use of this medicine in children. Special care may be needed. For Delton See, talk to your pediatrician regarding the use of this medicine in children. While this drug may be prescribed for children as young as 13 years for selected conditions, precautions do apply. Overdosage: If you think you've taken too much of this medicine contact a poison control center or emergency room at once. Overdosage: If you think you have taken  too much of this medicine contact a poison control center or emergency room at once. NOTE: This medicine is only for you. Do not share this medicine with others. What if I miss a dose? It is important not to miss your dose. Call your doctor or health care professional if you are unable to keep an appointment. What may interact with  this medicine? Do not take this medicine with any of the following medications: -other medicines containing denosumab This medicine may also interact with the following medications: -medicines that suppress the immune system -medicines that treat cancer -steroid medicines like prednisone or cortisone This list may not describe all possible interactions. Give your health care provider a list of all the medicines, herbs, non-prescription drugs, or dietary supplements you use. Also tell them if you smoke, drink alcohol, or use illegal drugs. Some items may interact with your medicine. What should I watch for while using this medicine? Visit your doctor or health care professional for regular checks on your progress. Your doctor or health care professional may order blood tests and other tests to see how you are doing. Call your doctor or health care professional if you get a cold or other infection while receiving this medicine. Do not treat yourself. This medicine may decrease your body's ability to fight infection. You should make sure you get enough calcium and vitamin D while you are taking this medicine, unless your doctor tells you not to. Discuss the foods you eat and the vitamins you take with your health care professional. See your dentist regularly. Brush and floss your teeth as directed. Before you have any dental work done, tell your dentist you are receiving this medicine. Do not become pregnant while taking this medicine or for 5 months after stopping it. Women should inform their doctor if they wish to become pregnant or think they might be pregnant. There is a potential for serious side effects to an unborn child. Talk to your health care professional or pharmacist for more information. What side effects may I notice from receiving this medicine? Side effects that you should report to your doctor or health care professional as soon as possible: -allergic reactions like skin rash, itching  or hives, swelling of the face, lips, or tongue -breathing problems -chest pain -fast, irregular heartbeat -feeling faint or lightheaded, falls -fever, chills, or any other sign of infection -muscle spasms, tightening, or twitches -numbness or tingling -skin blisters or bumps, or is dry, peels, or red -slow healing or unexplained pain in the mouth or jaw -unusual bleeding or bruising Side effects that usually do not require medical attention (Report these to your doctor or health care professional if they continue or are bothersome.): -muscle pain -stomach upset, gas This list may not describe all possible side effects. Call your doctor for medical advice about side effects. You may report side effects to FDA at 1-800-FDA-1088. Where should I keep my medicine? This medicine is only given in a clinic, doctor's office, or other health care setting and will not be stored at home. NOTE: This sheet is a summary. It may not cover all possible information. If you have questions about this medicine, talk to your doctor, pharmacist, or health care provider.  2015, Elsevier/Gold Standard. (2011-10-05 12:37:47)

## 2014-10-15 ENCOUNTER — Other Ambulatory Visit: Payer: Self-pay

## 2014-10-15 NOTE — Telephone Encounter (Signed)
Lm for pt to call back to clarify dosage of lasix our records indicate pt taking 10 mg daily vs refill request is for BID

## 2014-10-16 ENCOUNTER — Other Ambulatory Visit: Payer: Self-pay

## 2014-10-16 MED ORDER — WARFARIN SODIUM 2.5 MG PO TABS: ORAL_TABLET | ORAL | Status: DC

## 2014-10-18 ENCOUNTER — Other Ambulatory Visit (HOSPITAL_COMMUNITY): Payer: Self-pay | Admitting: Oncology

## 2014-10-18 ENCOUNTER — Encounter (HOSPITAL_BASED_OUTPATIENT_CLINIC_OR_DEPARTMENT_OTHER): Payer: Medicare Other

## 2014-10-18 DIAGNOSIS — C61 Malignant neoplasm of prostate: Secondary | ICD-10-CM

## 2014-10-18 DIAGNOSIS — C7951 Secondary malignant neoplasm of bone: Secondary | ICD-10-CM | POA: Diagnosis present

## 2014-10-18 DIAGNOSIS — C8590 Non-Hodgkin lymphoma, unspecified, unspecified site: Secondary | ICD-10-CM | POA: Diagnosis not present

## 2014-10-18 LAB — COMPREHENSIVE METABOLIC PANEL
ALT: 21 U/L (ref 17–63)
AST: 22 U/L (ref 15–41)
Albumin: 4 g/dL (ref 3.5–5.0)
Alkaline Phosphatase: 81 U/L (ref 38–126)
Anion gap: 9 (ref 5–15)
BUN: 17 mg/dL (ref 6–20)
CO2: 27 mmol/L (ref 22–32)
Calcium: 9.5 mg/dL (ref 8.9–10.3)
Chloride: 104 mmol/L (ref 101–111)
Creatinine, Ser: 0.89 mg/dL (ref 0.61–1.24)
GFR calc Af Amer: >60 mL/min (ref >60)
GFR calc non Af Amer: >60 mL/min (ref >60)
Glucose, Bld: 201 mg/dL — ABNORMAL HIGH (ref 65–99)
Potassium: 3.6 mmol/L (ref 3.5–5.1)
Sodium: 140 mmol/L (ref 135–145)
Total Bilirubin: 1.2 mg/dL (ref 0.3–1.2)
Total Protein: 6.9 g/dL (ref 6.5–8.1)

## 2014-10-18 LAB — PSA: PSA: 6.04 ng/mL — ABNORMAL HIGH (ref 0.00–4.00)

## 2014-10-18 MED ORDER — DENOSUMAB 120 MG/1.7ML ~~LOC~~ SOLN
120.0000 mg | Freq: Once | SUBCUTANEOUS | Status: AC
  Administered 2014-10-18: 120 mg via SUBCUTANEOUS
  Filled 2014-10-18: qty 1.7

## 2014-10-18 NOTE — Patient Instructions (Signed)
..  [place] at [facility] Discharge Instructions  RECOMMENDATIONS MADE BY THE CONSULTANT AND ANY TEST RESULTS WILL BE SENT TO YOUR REFERRING PHYSICIAN.  Delton See  Thank you for choosing [place] at [facility] [name] to provide your oncology and hematology care.  To afford each patient quality time with our provider, please arrive at least 15 minutes before your scheduled appointment time.    You need to re-schedule your appointment should you arrive 10 or more minutes late.  We strive to give you quality time with our providers, and arriving late affects you and other patients whose appointments are after yours.  Also, if you no show three or more times for appointments you may be dismissed from the clinic at the providers discretion.     Again, thank you for choosing [facility].  Our hope is that these requests will decrease the amount of time that you wait before being seen by our physicians.       _____________________________________________________________  Should you have questions after your visit to [facility], please contact our office at [phone] between the hours of 8:30 a.m. and 4:30 p.m.  Voicemails left after 4:30 p.m. will not be returned until the following business day.  For prescription refill requests, have your pharmacy contact our office.

## 2014-10-18 NOTE — Progress Notes (Signed)
[name] presents today for injection per MD orders. Xgeva 173mcg administered SQ in left Abdomen. Administration without incident. Patient tolerated well.

## 2014-10-19 LAB — VITAMIN D 25 HYDROXY (VIT D DEFICIENCY, FRACTURES): Vit D, 25-Hydroxy: 42.4 ng/mL (ref 30.0–100.0)

## 2014-10-19 NOTE — Progress Notes (Signed)
LABS DRawn

## 2014-10-23 ENCOUNTER — Other Ambulatory Visit: Payer: Self-pay | Admitting: *Deleted

## 2014-10-23 MED ORDER — POTASSIUM CHLORIDE CRYS ER 10 MEQ PO TBCR: 10.0000 meq | EXTENDED_RELEASE_TABLET | Freq: Every day | ORAL | Status: DC

## 2014-10-24 ENCOUNTER — Ambulatory Visit (INDEPENDENT_AMBULATORY_CARE_PROVIDER_SITE_OTHER): Payer: Medicare Other | Admitting: *Deleted

## 2014-10-24 ENCOUNTER — Encounter (HOSPITAL_COMMUNITY): Payer: Self-pay | Admitting: Hematology & Oncology

## 2014-10-24 DIAGNOSIS — Z5181 Encounter for therapeutic drug level monitoring: Secondary | ICD-10-CM

## 2014-10-24 DIAGNOSIS — Z7901 Long term (current) use of anticoagulants: Secondary | ICD-10-CM

## 2014-10-24 DIAGNOSIS — I4891 Unspecified atrial fibrillation: Secondary | ICD-10-CM

## 2014-10-24 LAB — POCT INR: INR: 3.2

## 2014-11-07 ENCOUNTER — Other Ambulatory Visit (HOSPITAL_COMMUNITY): Payer: Self-pay

## 2014-11-07 DIAGNOSIS — C61 Malignant neoplasm of prostate: Secondary | ICD-10-CM

## 2014-11-07 DIAGNOSIS — C7951 Secondary malignant neoplasm of bone: Secondary | ICD-10-CM

## 2014-11-09 ENCOUNTER — Encounter (HOSPITAL_BASED_OUTPATIENT_CLINIC_OR_DEPARTMENT_OTHER): Payer: Medicare Other

## 2014-11-09 ENCOUNTER — Encounter (HOSPITAL_COMMUNITY): Payer: Medicare Other | Attending: Hematology & Oncology

## 2014-11-09 VITALS — BP 157/93 | HR 69 | Temp 97.5°F | Resp 20

## 2014-11-09 DIAGNOSIS — C7951 Secondary malignant neoplasm of bone: Secondary | ICD-10-CM

## 2014-11-09 DIAGNOSIS — C61 Malignant neoplasm of prostate: Secondary | ICD-10-CM

## 2014-11-09 DIAGNOSIS — C8590 Non-Hodgkin lymphoma, unspecified, unspecified site: Secondary | ICD-10-CM | POA: Diagnosis not present

## 2014-11-09 LAB — COMPREHENSIVE METABOLIC PANEL
ALT: 18 U/L (ref 17–63)
AST: 21 U/L (ref 15–41)
Albumin: 3.9 g/dL (ref 3.5–5.0)
Alkaline Phosphatase: 93 U/L (ref 38–126)
Anion gap: 8 (ref 5–15)
BUN: 15 mg/dL (ref 6–20)
CO2: 29 mmol/L (ref 22–32)
Calcium: 9.1 mg/dL (ref 8.9–10.3)
Chloride: 102 mmol/L (ref 101–111)
Creatinine, Ser: 0.93 mg/dL (ref 0.61–1.24)
GFR calc Af Amer: >60 mL/min (ref >60)
GFR calc non Af Amer: >60 mL/min (ref >60)
Glucose, Bld: 187 mg/dL — ABNORMAL HIGH (ref 65–99)
Potassium: 3.5 mmol/L (ref 3.5–5.1)
Sodium: 139 mmol/L (ref 135–145)
Total Bilirubin: 1 mg/dL (ref 0.3–1.2)
Total Protein: 7 g/dL (ref 6.5–8.1)

## 2014-11-09 MED ORDER — DENOSUMAB 120 MG/1.7ML ~~LOC~~ SOLN
120.0000 mg | Freq: Once | SUBCUTANEOUS | Status: AC
  Administered 2014-11-09: 120 mg via SUBCUTANEOUS
  Filled 2014-11-09: qty 1.7

## 2014-11-09 NOTE — Patient Instructions (Signed)
[place] at [facility] Discharge Instructions  RECOMMENDATIONS MADE BY THE CONSULTANT AND ANY TEST RESULTS WILL BE SENT TO YOUR REFERRING PHYSICIAN.  Xgeva injection given today as ordered. Continue monthly lab work and xgeva injections os ordered. Return as scheduled.  Thank you for choosing [place] [facility] to provide your oncology and hematology care.  To afford each patient quality time with our provider, please arrive at least 15 minutes before your scheduled appointment time.    You need to re-schedule your appointment should you arrive 10 or more minutes late.  We strive to give you quality time with our providers, and arriving late affects you and other patients whose appointments are after yours.  Also, if you no show three or more times for appointments you may be dismissed from the clinic at the providers discretion.     Again, thank you for choosing [facility].  Our hope is that these requests will decrease the amount of time that you wait before being seen by our physicians.       _____________________________________________________________  Should you have questions after your visit to [facility], please contact our office at [phone] between the hours of 8:30 a.m. and 4:30 p.m.  Voicemails left after 4:30 p.m. will not be returned until the following business day.  For prescription refill requests, have your pharmacy contact our office.

## 2014-11-09 NOTE — Progress Notes (Signed)
[name] presents today for injection per MD orders. Xgeva 120 mg  administered SQ in left Abdomen. Administration without incident. Patient tolerated well.    

## 2014-11-12 NOTE — Progress Notes (Signed)
Lab draw

## 2014-11-15 ENCOUNTER — Other Ambulatory Visit: Payer: Self-pay

## 2014-11-15 ENCOUNTER — Other Ambulatory Visit: Payer: Self-pay | Admitting: Physician Assistant

## 2014-11-15 MED ORDER — LISINOPRIL-HYDROCHLOROTHIAZIDE 20-25 MG PO TABS: 1.0000 | ORAL_TABLET | Freq: Every day | ORAL | Status: DC

## 2014-11-21 ENCOUNTER — Ambulatory Visit (INDEPENDENT_AMBULATORY_CARE_PROVIDER_SITE_OTHER): Payer: Medicare Other | Admitting: *Deleted

## 2014-11-21 DIAGNOSIS — Z7901 Long term (current) use of anticoagulants: Secondary | ICD-10-CM | POA: Diagnosis not present

## 2014-11-21 DIAGNOSIS — Z5181 Encounter for therapeutic drug level monitoring: Secondary | ICD-10-CM

## 2014-11-21 DIAGNOSIS — I4891 Unspecified atrial fibrillation: Secondary | ICD-10-CM | POA: Diagnosis not present

## 2014-11-21 LAB — POCT INR: INR: 2.6

## 2014-12-03 DIAGNOSIS — I10 Essential (primary) hypertension: Secondary | ICD-10-CM | POA: Diagnosis not present

## 2014-12-03 DIAGNOSIS — C61 Malignant neoplasm of prostate: Secondary | ICD-10-CM | POA: Diagnosis not present

## 2014-12-03 DIAGNOSIS — I4891 Unspecified atrial fibrillation: Secondary | ICD-10-CM | POA: Diagnosis not present

## 2014-12-03 DIAGNOSIS — E1165 Type 2 diabetes mellitus with hyperglycemia: Secondary | ICD-10-CM | POA: Diagnosis not present

## 2014-12-04 ENCOUNTER — Ambulatory Visit (INDEPENDENT_AMBULATORY_CARE_PROVIDER_SITE_OTHER): Payer: Medicare Other | Admitting: Urology

## 2014-12-04 DIAGNOSIS — C7951 Secondary malignant neoplasm of bone: Secondary | ICD-10-CM | POA: Diagnosis not present

## 2014-12-04 DIAGNOSIS — C61 Malignant neoplasm of prostate: Secondary | ICD-10-CM | POA: Diagnosis not present

## 2014-12-11 ENCOUNTER — Ambulatory Visit (HOSPITAL_COMMUNITY): Payer: Medicare Other

## 2014-12-11 ENCOUNTER — Encounter (HOSPITAL_COMMUNITY): Payer: Medicare Other | Attending: Hematology & Oncology

## 2014-12-11 DIAGNOSIS — C8590 Non-Hodgkin lymphoma, unspecified, unspecified site: Secondary | ICD-10-CM | POA: Insufficient documentation

## 2014-12-11 DIAGNOSIS — C61 Malignant neoplasm of prostate: Secondary | ICD-10-CM | POA: Diagnosis present

## 2014-12-11 DIAGNOSIS — C7951 Secondary malignant neoplasm of bone: Secondary | ICD-10-CM

## 2014-12-11 LAB — COMPREHENSIVE METABOLIC PANEL
ALT: 15 U/L — ABNORMAL LOW (ref 17–63)
AST: 20 U/L (ref 15–41)
Albumin: 3.9 g/dL (ref 3.5–5.0)
Alkaline Phosphatase: 79 U/L (ref 38–126)
Anion gap: 8 (ref 5–15)
BUN: 19 mg/dL (ref 6–20)
CO2: 30 mmol/L (ref 22–32)
Calcium: 9 mg/dL (ref 8.9–10.3)
Chloride: 100 mmol/L — ABNORMAL LOW (ref 101–111)
Creatinine, Ser: 0.84 mg/dL (ref 0.61–1.24)
GFR calc Af Amer: >60 mL/min (ref >60)
GFR calc non Af Amer: >60 mL/min (ref >60)
Glucose, Bld: 256 mg/dL — ABNORMAL HIGH (ref 65–99)
Potassium: 4 mmol/L (ref 3.5–5.1)
Sodium: 138 mmol/L (ref 135–145)
Total Bilirubin: 0.7 mg/dL (ref 0.3–1.2)
Total Protein: 7 g/dL (ref 6.5–8.1)

## 2014-12-11 LAB — CBC WITH DIFFERENTIAL/PLATELET
Basophils Absolute: 0 10*3/uL (ref 0.0–0.1)
Basophils Relative: 0 % (ref 0–1)
Eosinophils Absolute: 0.1 10*3/uL (ref 0.0–0.7)
Eosinophils Relative: 2 % (ref 0–5)
HCT: 41.6 % (ref 39.0–52.0)
Hemoglobin: 14.9 g/dL (ref 13.0–17.0)
Lymphocytes Relative: 37 % (ref 12–46)
Lymphs Abs: 1.8 10*3/uL (ref 0.7–4.0)
MCH: 31.9 pg (ref 26.0–34.0)
MCHC: 35.8 g/dL (ref 30.0–36.0)
MCV: 89.1 fL (ref 78.0–100.0)
Monocytes Absolute: 0.5 10*3/uL (ref 0.1–1.0)
Monocytes Relative: 10 % (ref 3–12)
Neutro Abs: 2.4 10*3/uL (ref 1.7–7.7)
Neutrophils Relative %: 51 % (ref 43–77)
Platelets: 159 10*3/uL (ref 150–400)
RBC: 4.67 MIL/uL (ref 4.22–5.81)
RDW: 12.9 % (ref 11.5–15.5)
WBC: 4.8 10*3/uL (ref 4.0–10.5)

## 2014-12-11 LAB — PSA: PSA: 9.95 ng/mL — ABNORMAL HIGH (ref 0.00–4.00)

## 2014-12-11 NOTE — Progress Notes (Signed)
Labs drawn

## 2014-12-12 ENCOUNTER — Encounter (HOSPITAL_COMMUNITY): Payer: Self-pay | Admitting: Hematology & Oncology

## 2014-12-12 ENCOUNTER — Encounter (HOSPITAL_COMMUNITY): Payer: Medicare Other

## 2014-12-12 ENCOUNTER — Ambulatory Visit (HOSPITAL_COMMUNITY): Payer: Medicare Other

## 2014-12-12 ENCOUNTER — Encounter (HOSPITAL_BASED_OUTPATIENT_CLINIC_OR_DEPARTMENT_OTHER): Payer: Medicare Other | Admitting: Hematology & Oncology

## 2014-12-12 VITALS — BP 144/67 | HR 94 | Temp 97.9°F | Resp 20 | Wt 188.6 lb

## 2014-12-12 DIAGNOSIS — C61 Malignant neoplasm of prostate: Secondary | ICD-10-CM | POA: Diagnosis not present

## 2014-12-12 DIAGNOSIS — C7951 Secondary malignant neoplasm of bone: Secondary | ICD-10-CM | POA: Diagnosis not present

## 2014-12-12 MED ORDER — DENOSUMAB 120 MG/1.7ML ~~LOC~~ SOLN
120.0000 mg | Freq: Once | SUBCUTANEOUS | Status: AC
  Administered 2014-12-12: 120 mg via SUBCUTANEOUS
  Filled 2014-12-12: qty 1.7

## 2014-12-12 MED ORDER — ABIRATERONE ACETATE 250 MG PO TABS: 1000.0000 mg | ORAL_TABLET | Freq: Every day | ORAL | Status: DC

## 2014-12-12 MED ORDER — PREDNISONE 5 MG PO TABS: 5.0000 mg | ORAL_TABLET | Freq: Two times a day (BID) | ORAL | Status: DC

## 2014-12-12 NOTE — Progress Notes (Signed)
[name],[name], MD [place] / [place] [address]   DIAGNOSIS: NHL (non-Hodgkin's lymphoma)   Staging form: Lymphoid Neoplasms, AJCC 6th Edition     Clinical: Stage III - Signed by Baird Cancer, PA on 08/18/2011  1. 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 Significant vasomotor instability on full dose Casodex.  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 Margot Chimes in October 2005  #3. Diffuse large B-cell lymphoma clinically stage IIIAS STATUS post cervical lymph node biopsy on 08/03/2003. His lymphoma CD20 positive and PET scan showed positivity in the spleen. Bone marrow aspiration and biopsy were negative however. He received R. CHOP x6 cycles with a complete remission established by PET scan criteria on 11/23/2003. He completed 6 full cycles total. He remains without disease recurrence. He remains free of B. ymptomatology.   CURRENT THERAPY: Lupron every 6 months/casodex  INTERVAL HISTORY: [name] 81 y.o. male returns for follow up.   The patient appears to be doing well.  He has been mowing and gardening.  He is here to discuss options to monitor his PSA levels.  He does not have any trouble with his shots.  He says that his blood sugar levels usually stay moderated.  He likes to eat ice cream before he goes to bed.  His wife is concerned with what amount of Vit. D and Calcium the patient should be taking per day.  He will be given his Xgeva shot today.  His Lupron shot will be due in November.     MEDICAL HISTORY: Past Medical History  Diagnosis Date  . Hypertension   . Atrial fibrillation     Dr. Harl Bowie- [facility] follows saw 11'14  . 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 Margot Chimes, thus far without recurrence.   . NHL (non-Hodgkin's lymphoma) 08/18/2011    Diffuse large B-cell lymphoma, clinically stage IIIA, CD20 positive, status post cervical lymph node biopsy 08/03/2003 on the left. PET scan was also positive in the spleen and small bowel region, but bone marrow aspiration and biopsy were negative. So he essentially had stage IIIAs. He received R-CHOP x6 cycles with CR established by PET scan criteria on 11/23/2003 with no evidence for relapse th  . Cataracts, both eyes     surgery planned May 2015  . Bladder stones     tx. with oral meds and antibiotics, now surgery planned  . Diabetes mellitus without complication     has HYPERTENSION, UNSPECIFIED; ATRIAL FIBRILLATION; Long term (current) use of anticoagulants; NHL (non-Hodgkin's lymphoma); GIST (gastrointestinal stromal tumor), malignant; Encounter for therapeutic drug monitoring; Hypertrophy of prostate with urinary obstruction and other lower urinary tract symptoms (LUTS); and Prostate cancer on his problem list.     has No Known Allergies.  Current Outpatient Prescriptions on File Prior to Visit  Medication Sig Dispense Refill  . acetaminophen (TYLENOL) 500 MG tablet Take 500 mg by mouth every 6 (six) hours as needed for mild pain.     Marland Kitchen amitriptyline (ELAVIL) 25 MG tablet Take 25 mg by mouth at bedtime.    . Calcium Carb-Cholecalciferol 600-800 MG-UNIT TABS Take 1  capsule by mouth at bedtime.    . Cholecalciferol (VITAMIN D) 2000 UNITS CAPS Take 1 capsule by mouth daily.    . Cranberry 200 MG CAPS Take 4,200 mg by mouth every morning.     . diltiazem (DILACOR XR) 180 MG 24 hr capsule Take 1 capsule (180 mg total) by mouth every evening. 90 capsule 3  . Docusate Calcium (STOOL SOFTENER PO) Take 1 tablet by mouth 2 (two) times daily.     Marland Kitchen glipiZIDE (GLUCOTROL) 5 MG tablet Take by mouth daily  before breakfast.    . linagliptin (TRADJENTA) 5 MG TABS tablet Take 5 mg by mouth at bedtime.     Marland Kitchen lisinopril-hydrochlorothiazide (PRINZIDE,ZESTORETIC) 20-25 MG per tablet Take 1 tablet by mouth daily. 30 tablet 0  . omeprazole (PRILOSEC) 20 MG capsule Take 20 mg by mouth daily.     . potassium chloride (K-DUR,KLOR-CON) 10 MEQ tablet Take 1 tablet (10 mEq total) by mouth daily. 90 tablet 3  . SENNA PO Take 1 capsule by mouth as needed.    . warfarin (COUMADIN) 2.5 MG tablet Take as directed by [facility] 60 tablet 3  . bicalutamide (CASODEX) 50 MG tablet Take 25 mg by mouth daily.     No current facility-administered medications on file prior to visit.    SURGICAL HISTORY: Past Surgical History  Procedure Laterality Date  . Cholecystectomy    . Incision and drainage perirectal abscess    . Portacath placement      insertion and removal -last chemotherapy 10 yrs ago  . Lymph node dissection Left     '05  . Port-a-cath removal    . Transurethral resection of prostate N/A 08/10/2013    Procedure: TRANSURETHRAL RESECTION OF THE PROSTATE WITH GYRUS INSTRUMENTS;  Surgeon: [name], MD;  Location: [facility];  Service: Urology;  Laterality: N/A;  . Cystoscopy with litholapaxy N/A 08/10/2013    Procedure: CYSTOSCOPY WITH LITHOLAPAXY WITH [name];  Surgeon: [name], MD;  Location: [facility];  Service: Urology;  Laterality: N/A;  . Cataract extraction Right 08/2013    SOCIAL HISTORY: Social History   Social History  . Marital Status: Married    Spouse Name: N/A  . Number of Children: N/A  . Years of Education: N/A   Occupational History  . retired     Hotel manager   Social History Main Topics  . Smoking status: Never Smoker   . Smokeless tobacco: Never Used  . Alcohol Use: No  . Drug Use: No  . Sexual Activity: Not Currently   Other Topics Concern  . Not on file   Social History Narrative  He has 8 grandchildren. He takes care of his garden and mows his  lawn. 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  FAMILY HISTORY: History reviewed. No pertinent family history.   Father died of pneumonia when the patient was 32 year of age Mother died of MI at 29 All of his siblings are deceased  Review of Systems  Constitutional: Positive for hot flashes. Negative for fever, chills, weight loss and malaise/fatigue.  HENT: Negative for congestion, hearing loss, nosebleeds, sore throat and tinnitus.   Eyes: Negative for blurred vision, double vision, pain and discharge.  Respiratory: Negative for cough, hemoptysis, sputum production, shortness of breath and wheezing.   Cardiovascular: Negative for chest pain, palpitations, claudication, leg swelling and PND.  Gastrointestinal: Negative for heartburn, nausea, vomiting, abdominal pain, diarrhea, constipation, blood in stool and melena.  Genitourinary: Negative for dysuria, urgency, frequency and hematuria.  Musculoskeletal: Negative for myalgias, joint pain and falls.  Skin: Negative for itching and rash.  Neurological: Negative for dizziness, tingling, tremors, sensory change, speech change, focal weakness, seizures, loss of consciousness, weakness and headaches.  Endo/Heme/Allergies: Does not bruise/bleed easily.  Psychiatric/Behavioral: Negative for depression, suicidal ideas, memory loss and substance abuse. The patient is not nervous/anxious and does not have insomnia.   14 point review of systems was performed and is negative except as detailed under history of present illness and above   PHYSICAL EXAMINATION  ECOG PERFORMANCE STATUS: 0 - Asymptomatic  Filed Vitals:   12/12/14 1253  BP: 144/67  Pulse: 94  Temp: 97.9 F (36.6 C)  Resp: 20    Physical Exam  Constitutional: He is oriented to person, place, and time and well-developed, well-nourished, and in no distress.  HENT:  Head: Normocephalic and atraumatic.  Nose: Nose normal.  Mouth/Throat: Oropharynx is clear and moist. No oropharyngeal exudate.    Eyes: Conjunctivae and EOM are normal. Pupils are equal, round, and reactive to light. Right eye exhibits no discharge. Left eye exhibits no discharge. No scleral icterus.  Neck: Normal range of motion. Neck supple. No tracheal deviation present. No thyromegaly present.  Cardiovascular: Normal rate, regular rhythm and normal heart sounds.  Exam reveals no gallop and no friction rub.   No murmur heard. Pulmonary/Chest: Effort normal and breath sounds normal. He has no wheezes. He has no rales.  Abdominal: Soft. Bowel sounds are normal. He exhibits no distension and no mass. There is no tenderness. There is no rebound and no guarding.  Musculoskeletal: Normal range of motion. He exhibits no edema.  Lymphadenopathy:    He has no cervical adenopathy.  Neurological: He is alert and oriented to person, place, and time. He has normal reflexes. No cranial nerve deficit. Gait normal. Coordination normal.  Skin: Skin is warm and dry. No rash noted.  Psychiatric: Mood, memory, affect and judgment normal.  Nursing note and vitals reviewed.   LABORATORY DATA:  CBC    Component Value Date/Time   WBC 4.8 12/11/2014 0916   RBC 4.67 12/11/2014 0916   RBC 4.77 09/08/2013 1047   HGB 14.9 12/11/2014 0916   HCT 41.6 12/11/2014 0916   PLT 159 12/11/2014 0916   MCV 89.1 12/11/2014 0916   MCH 31.9 12/11/2014 0916   MCHC 35.8 12/11/2014 0916   RDW 12.9 12/11/2014 0916   LYMPHSABS 1.8 12/11/2014 0916   MONOABS 0.5 12/11/2014 0916   EOSABS 0.1 12/11/2014 0916   BASOSABS 0.0 12/11/2014 0916   CMP     Component Value Date/Time   NA 138 12/11/2014 0916   K 4.0 12/11/2014 0916   CL 100* 12/11/2014 0916   CO2 30 12/11/2014 0916   GLUCOSE 256* 12/11/2014 0916   BUN 19 12/11/2014 0916   CREATININE 0.84 12/11/2014 0916   CREATININE 1.14 07/27/2013 1004   CALCIUM 9.0 12/11/2014 0916   PROT 7.0 12/11/2014 0916   ALBUMIN 3.9 12/11/2014 0916   AST 20 12/11/2014 0916   ALT 15* 12/11/2014 0916   ALKPHOS  79 12/11/2014 0916   BILITOT 0.7 12/11/2014 0916   GFRNONAA >60 12/11/2014 0916   GFRNONAA 58* 07/27/2013 1004   GFRAA >60 12/11/2014 0916   GFRAA 66 07/27/2013 1004    RADIOLOGY:  EXAM: DUAL X-RAY ABSORPTIOMETRY (DXA) FOR BONE MINERAL DENSITY  IMPRESSION: Ordering Physician: Dr. Patrici Ranks,  Your patient EDWING FIGLEY completed a BMD test on 09/19/2014 using the [place] (software version:  14.10) manufactured by [employer]. The following summarizes the results of our evaluation. PATIENT BIOGRAPHICAL: Name: TABER, SWEETSER Patient ID: [id number] Birth Date: 01/20/1924 Height: 70.5 in. Gender: Male Exam Date: 09/19/2014 Weight: 188.0 lbs. Indications: Caucasian, Height Loss, Low Calcium Intake, Prostate Cancer Fractures: Treatments: DENSITOMETRY RESULTS: Site Region Measured Date Measured Age WHO Classification Young Adult T-score BMD %Change vs. Previous Significant Change (*) DualFemur Neck Right 09[age]19/2014 88.6 N[age]A -0.7 0.975 g/cm2  Left Forearm Radius 33% 09/19/2014 88.6 N[age]A -0.5 0.767 g/cm2 ASSESSMENT: BMD as determined from Femur Neck Right is 0.975 g/cm2 with a T-Score of -0.7. This patient is considered normal by World Healh Organization [facility]) Criteria. (Lumbar spine was not utilized due to advanced degenerative changes.) (Patient does not meet criteria for FRAX assessment.)  World Health Organization [facility]) criteria for post-menopausal, Caucasian Women: Normal: T-score at or above -1 SD Osteopenia: T-score between -1 and -2.5 SD Osteoporosis: T-score at or below -2.5 SD  RECOMMENDATIONS: [place] recommends that FDA-approved medial therapies be considered in postmenopausal women and men age 38 or older with a: 1. Hip or vertberal (clinical or morphometric) fracture. 2. T-Score of < -2.5 at the spine or hip. 3. Ten-year fracture probability by FRAX of 3% or  greater for hip fracture or 20% or greater for major osteoporotic fracture.  All treatment decisions require clinical judgment and consideration of indiviual patient factors, including patient preferences, co-morbidities, previous drug use, risk factors not captured in the FRAX model (e.g. falls, vitamin D deficiency, increased bone turnover, interval significant decline in bone density) and possible under-or over-estimation of fracture risk by FRAX.  All patients should ensure an adequate intake of dietary calcium (1200 mg/d) and vitamin D (800 IU daily) unless contraindicated.  FOLLOW-UP: People with diagnosed cases of osteoporosis or osteopenia should be regularly tested for bone mineral density. For patients eligible for Medicare, routine testing is allowed once every 2 years. Testing frequency can be increased for patients who have rapidly progressing disease, or for those who are receiving medical therapy to restore bone mass.  I have reviewed this report, and agree with the above findings.  [facility]   Electronically Signed  By: Rolm Baptise M.D.  On: 09/19/2014 09:42    CLINICAL DATA: Prostate cancer diagnosed in 2005 with recent bladder stones. Rising PSA. Diabetes. Hypertension. Lymphoma in 2013. Gastrointestinal stromal tumor in 2013.  EXAM: CT CHEST, ABDOMEN, AND PELVIS WITH CONTRAST IMPRESSION: 1. Osseous metastasis. When correlated with today's bone scan, felt to be slightly progressive. 2. No evidence of extraosseous metastasis. 3. Esophageal air fluid level suggests dysmotility or gastroesophageal reflux. 4. Small bladder saccules, suggesting a component of outlet obstruction.   Electronically Signed  By: Abigail Miyamoto M.D.  On: 10/05/2014 16:13   ASSESSMENT and THERAPY PLAN:  Stage IV adenocarcinoma of the prostate Lupron therapy/Casodex Rising PSA with value of 3.11 ng/ml on 09/10/2014  DEXA with normal bone  density XGEVA started on 10/18/2014  We reviewed his PSA values. Unfortunately he is relatively short doubling time. Discontinuation of his Casodex did not cause a fall in his PSA. He is to continue on Xgeva and Lupron. I again addressed therapy with XTANDI and ZYTIGA.  A prescription today for Zytiga and prednisone. We will plan on seeing him back after he obtains the medication and has taken it for one week. We will arrange for teaching. I answered the patient and his wife's questions today. Plan as detailed as above.  Orders Placed This Encounter  Procedures  . SCHEDULING COMMUNICATION    Schedule 15 minute injection appointment    All questions were answered. The patient knows to call the clinic with any problems, questions or concerns. We can certainly see the patient much sooner if necessary. //  This document serves as a record of services personally performed by Ancil Linsey, MD. It was created on her behalf by Janace Hoard, a trained medical scribe. The creation of this record is based on the scribe's personal observations and the provider's statements to them. This document has been checked and approved by the attending provider.  I have reviewed the above documentation for accuracy and completeness, and I agree with the above.  This note was electronically signed.  Kelby Fam. Krishika Bugge MD

## 2014-12-12 NOTE — Patient Instructions (Addendum)
..  [place] at [facility] Discharge Instructions  RECOMMENDATIONS MADE BY THE CONSULTANT AND ANY TEST RESULTS WILL BE SENT TO YOUR REFERRING PHYSICIAN.  Exam per Dr. Youlanda Roys got your xgeva today. Will continue Lupron in November Will get Xtega approved and get bloodwork in three weeks and return for follow up appointment.  Thank you for choosing [place] at [facility] to provide your oncology and hematology care.  To afford each patient quality time with our provider, please arrive at least 15 minutes before your scheduled appointment time.    You need to re-schedule your appointment should you arrive 10 or more minutes late.  We strive to give you quality time with our providers, and arriving late affects you and other patients whose appointments are after yours.  Also, if you no show three or more times for appointments you may be dismissed from the clinic at the providers discretion.     Again, thank you for choosing [facility] [place].  Our hope is that these requests will decrease the amount of time that you wait before being seen by our physicians.       _____________________________________________________________  Should you have questions after your visit to [facility], please contact our office at [phone] between the hours of 8:30 a.m. and 4:30 p.m.  Voicemails left after 4:30 p.m. will not be returned until the following business day.  For prescription refill requests, have your pharmacy contact our office.

## 2014-12-12 NOTE — Progress Notes (Signed)
Please see doctors encounter for more information 

## 2014-12-17 ENCOUNTER — Telehealth (HOSPITAL_COMMUNITY): Payer: Self-pay | Admitting: Hematology & Oncology

## 2014-12-17 NOTE — Telephone Encounter (Signed)
HUMANA AUTHD ZYTIGA 12/13/14-12/12/16

## 2014-12-19 ENCOUNTER — Ambulatory Visit (INDEPENDENT_AMBULATORY_CARE_PROVIDER_SITE_OTHER): Payer: Medicare Other | Admitting: *Deleted

## 2014-12-19 DIAGNOSIS — H6123 Impacted cerumen, bilateral: Secondary | ICD-10-CM | POA: Diagnosis not present

## 2014-12-19 DIAGNOSIS — Z7901 Long term (current) use of anticoagulants: Secondary | ICD-10-CM | POA: Diagnosis not present

## 2014-12-19 DIAGNOSIS — I4891 Unspecified atrial fibrillation: Secondary | ICD-10-CM

## 2014-12-19 DIAGNOSIS — Z5181 Encounter for therapeutic drug level monitoring: Secondary | ICD-10-CM

## 2014-12-19 LAB — POCT INR: INR: 2.2

## 2014-12-25 ENCOUNTER — Other Ambulatory Visit: Payer: Self-pay | Admitting: Physician Assistant

## 2014-12-25 DIAGNOSIS — H6123 Impacted cerumen, bilateral: Secondary | ICD-10-CM | POA: Diagnosis not present

## 2014-12-31 DIAGNOSIS — H6123 Impacted cerumen, bilateral: Secondary | ICD-10-CM | POA: Diagnosis not present

## 2014-12-31 DIAGNOSIS — H903 Sensorineural hearing loss, bilateral: Secondary | ICD-10-CM | POA: Diagnosis not present

## 2015-01-02 ENCOUNTER — Encounter (HOSPITAL_COMMUNITY): Payer: Medicare Other | Attending: Hematology & Oncology | Admitting: Hematology & Oncology

## 2015-01-02 ENCOUNTER — Encounter (HOSPITAL_COMMUNITY): Payer: Self-pay | Admitting: Hematology & Oncology

## 2015-01-02 VITALS — BP 147/70 | HR 62 | Temp 97.7°F | Resp 18 | Wt 188.0 lb

## 2015-01-02 DIAGNOSIS — C61 Malignant neoplasm of prostate: Secondary | ICD-10-CM

## 2015-01-02 DIAGNOSIS — C8590 Non-Hodgkin lymphoma, unspecified, unspecified site: Secondary | ICD-10-CM | POA: Insufficient documentation

## 2015-01-02 NOTE — Progress Notes (Signed)
[name],[name], MD [name] / [name]   DIAGNOSIS: NHL (non-Hodgkin's lymphoma)   Staging form: Lymphoid Neoplasms, AJCC 6th Edition     Clinical: Stage III - Signed by Baird Cancer, PA on 08/18/2011  1. 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 Significant vasomotor instability on full dose Casodex.  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 Margot Chimes in October 2005  #3. Diffuse large B-cell lymphoma clinically stage IIIAS STATUS post cervical lymph node biopsy on 08/03/2003. His lymphoma CD20 positive and PET scan showed positivity in the spleen. Bone marrow aspiration and biopsy were negative however. He received R. CHOP x6 cycles with a complete remission established by PET scan criteria on 11/23/2003. He completed 6 full cycles total. He remains without disease recurrence. He remains free of B. symptomatology.   CURRENT THERAPY: Lupron every 6 months  INTERVAL HISTORY: [name] 81 y.o. male returns for follow up. He has still not received his Zytiga.  [name] is here today with his wife. They are questioning when he will receive his zytiga.   Otherwise, he says he's doing great. His wife confirms this and says that he feels good.  MEDICAL HISTORY: Past Medical History  Diagnosis Date  . Hypertension   . Atrial fibrillation     Dr. Harl Bowie- [facility] follows saw 11'14  . 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 Margot Chimes, thus far without recurrence.   . NHL (non-Hodgkin's lymphoma) 08/18/2011    Diffuse large B-cell lymphoma, clinically stage IIIA, CD20  positive, status post cervical lymph node biopsy 08/03/2003 on the left. PET scan was also positive in the spleen and small bowel region, but bone marrow aspiration and biopsy were negative. So he essentially had stage IIIAs. He received R-CHOP x6 cycles with CR established by PET scan criteria on 11/23/2003 with no evidence for relapse th  . Cataracts, both eyes     surgery planned May 2015  . Bladder stones     tx. with oral meds and antibiotics, now surgery planned  . Diabetes mellitus without complication     has HYPERTENSION, UNSPECIFIED; ATRIAL FIBRILLATION; Long term (current) use of anticoagulants; NHL (non-Hodgkin's lymphoma); GIST (gastrointestinal stromal tumor), malignant; Encounter for therapeutic drug monitoring; Hypertrophy of prostate with urinary obstruction and other lower urinary tract symptoms (LUTS); and Prostate cancer on his problem list.     has No Known Allergies.  Current Outpatient Prescriptions on File Prior to Visit  Medication Sig Dispense Refill  . acetaminophen (TYLENOL) 500 MG tablet Take 500 mg by mouth every 6 (six) hours as needed for mild pain.     Marland Kitchen amitriptyline (ELAVIL) 25 MG tablet Take 25 mg by mouth at bedtime.    . Calcium Carb-Cholecalciferol 600-800 MG-UNIT TABS Take 1 capsule by mouth at bedtime.    . Cholecalciferol (VITAMIN D) 2000 UNITS CAPS Take 1 capsule by mouth daily.    . Cranberry 200 MG CAPS Take 4,200 mg by mouth every morning.     . diltiazem (DILACOR XR) 180 MG 24 hr capsule Take 1 capsule (180 mg total) by mouth every evening.  90 capsule 3  . Docusate Calcium (STOOL SOFTENER PO) Take 1 tablet by mouth 2 (two) times daily.     Marland Kitchen glipiZIDE (GLUCOTROL) 5 MG tablet Take by mouth daily before breakfast.    . linagliptin (TRADJENTA) 5 MG TABS tablet Take 5 mg by mouth at bedtime.     Marland Kitchen lisinopril-hydrochlorothiazide (PRINZIDE,ZESTORETIC) 20-25 MG per tablet TAKE ONE TABLET BY MOUTH ONCE DAILY 30 tablet 6  . omeprazole (PRILOSEC) 20 MG  capsule Take 20 mg by mouth daily.     . potassium chloride (K-DUR,KLOR-CON) 10 MEQ tablet Take 1 tablet (10 mEq total) by mouth daily. 90 tablet 3  . SENNA PO Take 1 capsule by mouth as needed.    . warfarin (COUMADIN) 2.5 MG tablet Take as directed by [facility] (Patient taking differently: Take as directed by [facility] 1 pill on m-w-f and 2 pills other days) 60 tablet 3  . abiraterone Acetate (ZYTIGA) 250 MG tablet Take 4 tablets (1,000 mg total) by mouth daily. Take on an empty stomach 1 hour before or 2 hours after a meal (Patient not taking: Reported on 01/02/2015) 120 tablet 0  . bicalutamide (CASODEX) 50 MG tablet Take 25 mg by mouth daily.    . predniSONE (DELTASONE) 5 MG tablet Take 1 tablet (5 mg total) by mouth 2 (two) times daily with a meal. (Patient not taking: Reported on 01/02/2015) 60 tablet 6   No current facility-administered medications on file prior to visit.    SURGICAL HISTORY: Past Surgical History  Procedure Laterality Date  . Cholecystectomy    . Incision and drainage perirectal abscess    . Portacath placement      insertion and removal -last chemotherapy 10 yrs ago  . Lymph node dissection Left     '05  . Port-a-cath removal    . Transurethral resection of prostate N/A 08/10/2013    Procedure: TRANSURETHRAL RESECTION OF THE PROSTATE WITH GYRUS INSTRUMENTS;  Surgeon: [name], MD;  Location: [facility];  Service: Urology;  Laterality: N/A;  . Cystoscopy with litholapaxy N/A 08/10/2013    Procedure: CYSTOSCOPY WITH LITHOLAPAXY WITH [name];  Surgeon: [name], MD;  Location: [facility];  Service: Urology;  Laterality: N/A;  . Cataract extraction Right 08/2013    SOCIAL HISTORY: Social History   Social History  . Marital Status: Married    Spouse Name: N/A  . Number of Children: N/A  . Years of Education: N/A   Occupational History  . retired     Hotel manager   Social History Main Topics  . Smoking status: Never Smoker   .  Smokeless tobacco: Never Used  . Alcohol Use: No  . Drug Use: No  . Sexual Activity: Not Currently   Other Topics Concern  . Not on file   Social History Narrative  He has 8 grandchildren. He takes care of his garden and mows his lawn. 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  FAMILY HISTORY: History reviewed. No pertinent family history.  Father died of pneumonia when the patient was 45 year of age Mother died of MI at 79 All of his siblings are deceased  Review of Systems  Constitutional: Positive for hot flashes. Negative for fever, chills, weight loss and malaise/fatigue.  HENT: Negative for congestion, hearing loss, nosebleeds, sore throat and tinnitus.   Eyes: Negative for blurred vision, double vision, pain and discharge.  Respiratory: Negative for cough, hemoptysis, sputum production, shortness of breath and wheezing.   Cardiovascular: Negative for chest pain, palpitations,  claudication, leg swelling and PND.  Gastrointestinal: Negative for heartburn, nausea, vomiting, abdominal pain, diarrhea, constipation, blood in stool and melena.  Genitourinary: Negative for dysuria, urgency, frequency and hematuria.  Musculoskeletal: Negative for myalgias, joint pain and falls.  Skin: Negative for itching and rash.  Neurological: Negative for dizziness, tingling, tremors, sensory change, speech change, focal weakness, seizures, loss of consciousness, weakness and headaches.  Endo/Heme/Allergies: Does not bruise/bleed easily.  Psychiatric/Behavioral: Negative for depression, suicidal ideas, memory loss and substance abuse. The patient is not nervous/anxious and does not have insomnia.    14 point review of systems was performed and is negative except as detailed under history of present illness and above   PHYSICAL EXAMINATION  ECOG PERFORMANCE STATUS: 0 - Asymptomatic  Filed Vitals:   01/02/15 1140  BP: 147/70  Pulse: 62  Temp: 97.7 F (36.5 C)  Resp: 18    Physical Exam  Constitutional: He  is oriented to person, place, and time and well-developed, well-nourished, and in no distress. Appears younger than stated age HENT:  Head: Normocephalic and atraumatic.  Nose: Nose normal.  Mouth/Throat: Oropharynx is clear and moist. No oropharyngeal exudate.  Eyes: Conjunctivae and EOM are normal. Pupils are equal, round, and reactive to light. Right eye exhibits no discharge. Left eye exhibits no discharge. No scleral icterus.  Neck: Normal range of motion. Neck supple. No tracheal deviation present. No thyromegaly present.  Cardiovascular: Normal rate, regular rhythm and normal heart sounds.  Exam reveals no gallop and no friction rub.   No murmur heard. Pulmonary/Chest: Effort normal and breath sounds normal. He has no wheezes. He has no rales.  Abdominal: Soft. Bowel sounds are normal. He exhibits no distension and no mass. There is no tenderness. There is no rebound and no guarding.  Musculoskeletal: Normal range of motion. He exhibits no edema.  Lymphadenopathy:    He has no cervical adenopathy.  Neurological: He is alert and oriented to person, place, and time. He has normal reflexes. No cranial nerve deficit. Gait normal. Coordination normal.  Skin: Skin is warm and dry. No rash noted.  Psychiatric: Mood, memory, affect and judgment normal.  Nursing note and vitals reviewed.   LABORATORY DATA:  I have reviewed the labs below.  CBC    Component Value Date/Time   WBC 4.8 12/11/2014 0916   RBC 4.67 12/11/2014 0916   RBC 4.77 09/08/2013 1047   HGB 14.9 12/11/2014 0916   HCT 41.6 12/11/2014 0916   PLT 159 12/11/2014 0916   MCV 89.1 12/11/2014 0916   MCH 31.9 12/11/2014 0916   MCHC 35.8 12/11/2014 0916   RDW 12.9 12/11/2014 0916   LYMPHSABS 1.8 12/11/2014 0916   MONOABS 0.5 12/11/2014 0916   EOSABS 0.1 12/11/2014 0916   BASOSABS 0.0 12/11/2014 0916   CMP     Component Value Date/Time   NA 138 12/11/2014 0916   K 4.0 12/11/2014 0916   CL 100* 12/11/2014 0916   CO2  30 12/11/2014 0916   GLUCOSE 256* 12/11/2014 0916   BUN 19 12/11/2014 0916   CREATININE 0.84 12/11/2014 0916   CREATININE 1.14 07/27/2013 1004   CALCIUM 9.0 12/11/2014 0916   PROT 7.0 12/11/2014 0916   ALBUMIN 3.9 12/11/2014 0916   AST 20 12/11/2014 0916   ALT 15* 12/11/2014 0916   ALKPHOS 79 12/11/2014 0916   BILITOT 0.7 12/11/2014 0916   GFRNONAA >60 12/11/2014 0916   GFRNONAA 58* 07/27/2013 1004   GFRAA >60 12/11/2014 0916   GFRAA 66 07/27/2013  1004    Results for UGO, THOMA (MRN [id number]) as of 01/02/2015 18:56  Ref. Range 12/15/2013 08:37 03/09/2014 08:55 09/07/2014 11:05 10/18/2014 11:00 12/11/2014 09:16  PSA Latest Ref Range: 0.00-4.00 ng/mL 0.52 0.11 3.11 6.04 (H) 9.95 (H)    RADIOLOGY:  EXAM: DUAL X-RAY ABSORPTIOMETRY (DXA) FOR BONE MINERAL DENSITY  IMPRESSION: Ordering Physician: Dr. Patrici Ranks,  Your patient TERMAINE ROUPP completed a BMD test on 09/19/2014 using the [employer] (software version: 14.10) manufactured by [employer]. The following summarizes the results of our evaluation. PATIENT BIOGRAPHICAL: Name: QUINTRELL, BAZE Patient ID: [id number] Birth Date: [birth year] Height: 70.5 in. Gender: Male Exam Date: 09/19/2014 Weight: 188.0 lbs. Indications: Caucasian, Height Loss, Low Calcium Intake, Prostate Cancer Fractures: Treatments: DENSITOMETRY RESULTS: Site Region Measured Date Measured Age WHO Classification Young Adult T-score BMD %Change vs. Previous Significant Change (*) DualFemur Neck Right 09[age]19/2014 88.6 N[age]A -0.7 0.975 g/cm2  Left Forearm Radius 33% 09/19/2014 88.6 N[age]A -0.5 0.767 g/cm2 ASSESSMENT: BMD as determined from Femur Neck Right is 0.975 g/cm2 with a T-Score of -0.7. This patient is considered normal by World Healh [employer] [facility]) Criteria. (Lumbar spine was not utilized due to advanced degenerative changes.) (Patient does not meet criteria for FRAX  assessment.)  World Health Organization [facility] criteria for post-menopausal, Caucasian Women: Normal: T-score at or above -1 SD Osteopenia: T-score between -1 and -2.5 SD Osteoporosis: T-score at or below -2.5 SD  RECOMMENDATIONS: [place] recommends that FDA-approved medial therapies be considered in postmenopausal women and men age 22 or older with a: 1. Hip or vertberal (clinical or morphometric) fracture. 2. T-Score of < -2.5 at the spine or hip. 3. Ten-year fracture probability by FRAX of 3% or greater for hip fracture or 20% or greater for major osteoporotic fracture.  All treatment decisions require clinical judgment and consideration of indiviual patient factors, including patient preferences, co-morbidities, previous drug use, risk factors not captured in the FRAX model (e.g. falls, vitamin D deficiency, increased bone turnover, interval significant decline in bone density) and possible under-or over-estimation of fracture risk by FRAX.  All patients should ensure an adequate intake of dietary calcium (1200 mg/d) and vitamin D (800 IU daily) unless contraindicated.  FOLLOW-UP: People with diagnosed cases of osteoporosis or osteopenia should be regularly tested for bone mineral density. For patients eligible for Medicare, routine testing is allowed once every 2 years. Testing frequency can be increased for patients who have rapidly progressing disease, or for those who are receiving medical therapy to restore bone mass.  I have reviewed this report, and agree with the above findings.  [facility]   Electronically Signed  By: Rolm Baptise M.D.  On: 09/19/2014 09:42    CLINICAL DATA: Prostate cancer diagnosed in 2005 with recent bladder stones. Rising PSA. Diabetes. Hypertension. Lymphoma in 2013. Gastrointestinal stromal tumor in 2013.  EXAM: CT CHEST, ABDOMEN, AND PELVIS WITH  CONTRAST IMPRESSION: 1. Osseous metastasis. When correlated with today's bone scan, felt to be slightly progressive. 2. No evidence of extraosseous metastasis. 3. Esophageal air fluid level suggests dysmotility or gastroesophageal reflux. 4. Small bladder saccules, suggesting a component of outlet obstruction.   Electronically Signed  By: Abigail Miyamoto M.D.  On: 10/05/2014 16:13    ASSESSMENT and THERAPY PLAN:  Stage IV adenocarcinoma of the prostate Lupron therapy/Casodex Rising PSA with value of 3.11 ng/ml on 09/10/2014  DEXA with normal bone density XGEVA monthly Casodex discontinued with continued rise of PSA   We  are going to look into why his zytiga has not been received. His PSA doubling time is quite rapidly and I am concerned that he has not started his medication. I advised the patient and his wife we will let them know by the end of the week. I will plan on seeing him back one week after he starts his medication.  I reviewed his DEXA in advised them he has normal bone density. He is to continue taking daily calcium and vitamin D. He is to continue on Xgeva monthly.  Orders Placed This Encounter  Procedures  . CBC with Differential    Standing Status: Future     Number of Occurrences:      Standing Expiration Date: 01/02/2016  . Comprehensive metabolic panel    Standing Status: Future     Number of Occurrences:      Standing Expiration Date: 01/02/2016  . PSA    Standing Status: Future     Number of Occurrences:      Standing Expiration Date: 01/02/2016    All questions were answered. The patient knows to call the clinic with any problems, questions or concerns. We can certainly see the patient much sooner if necessary.   This document serves as a record of services personally performed by Ancil Linsey, MD. It was created on her behalf by Toni Amend, a trained medical scribe. The creation of this record is based on the scribe's personal observations  and the provider's statements to them. This document has been checked and approved by the attending provider.  I have reviewed the above documentation for accuracy and completeness, and I agree with the above.  This note was electronically signed.  Kelby Fam. Penland MD

## 2015-01-02 NOTE — Patient Instructions (Signed)
..  [place] at [facility] Discharge Instructions  RECOMMENDATIONS MADE BY THE CONSULTANT AND ANY TEST RESULTS WILL BE SENT TO YOUR REFERRING PHYSICIAN.  To follow up on status of zytiga and call patient  Thank you for choosing [place] at [facility] to provide your oncology and hematology care.  To afford each patient quality time with our provider, please arrive at least 15 minutes before your scheduled appointment time.    You need to re-schedule your appointment should you arrive 10 or more minutes late.  We strive to give you quality time with our providers, and arriving late affects you and other patients whose appointments are after yours.  Also, if you no show three or more times for appointments you may be dismissed from the clinic at the providers discretion.     Again, thank you for choosing [facility]  Our hope is that these requests will decrease the amount of time that you wait before being seen by our physicians.       _____________________________________________________________  Should you have questions after your visit to [facility], please contact our office at [phone] between the hours of 8:30 a.m. and 4:30 p.m.  Voicemails left after 4:30 p.m. will not be returned until the following business day.  For prescription refill requests, have your pharmacy contact our office.

## 2015-01-08 ENCOUNTER — Encounter (HOSPITAL_COMMUNITY): Payer: Self-pay | Admitting: Hematology & Oncology

## 2015-01-08 ENCOUNTER — Telehealth (HOSPITAL_COMMUNITY): Payer: Self-pay | Admitting: Hematology & Oncology

## 2015-01-08 DIAGNOSIS — E1142 Type 2 diabetes mellitus with diabetic polyneuropathy: Secondary | ICD-10-CM | POA: Diagnosis not present

## 2015-01-08 NOTE — Telephone Encounter (Signed)
PER TAMILIA APP WAS SENT TO MEDICAL REVIEW BECAUSE PT HAS MCR D COV. SHOULD HAVE A DETERMINATION BY THE END OF THE [facility] [phone]

## 2015-01-09 ENCOUNTER — Encounter (HOSPITAL_BASED_OUTPATIENT_CLINIC_OR_DEPARTMENT_OTHER): Payer: Medicare Other

## 2015-01-09 VITALS — BP 166/72 | HR 73 | Temp 98.0°F | Resp 20

## 2015-01-09 DIAGNOSIS — C7951 Secondary malignant neoplasm of bone: Secondary | ICD-10-CM | POA: Diagnosis not present

## 2015-01-09 DIAGNOSIS — C61 Malignant neoplasm of prostate: Secondary | ICD-10-CM | POA: Diagnosis present

## 2015-01-09 DIAGNOSIS — C8590 Non-Hodgkin lymphoma, unspecified, unspecified site: Secondary | ICD-10-CM | POA: Diagnosis not present

## 2015-01-09 LAB — CBC WITH DIFFERENTIAL/PLATELET
Basophils Absolute: 0 10*3/uL (ref 0.0–0.1)
Basophils Relative: 0 %
Eosinophils Absolute: 0.1 10*3/uL (ref 0.0–0.7)
Eosinophils Relative: 2 %
HCT: 44.3 % (ref 39.0–52.0)
Hemoglobin: 16 g/dL (ref 13.0–17.0)
Lymphocytes Relative: 41 %
Lymphs Abs: 2.8 10*3/uL (ref 0.7–4.0)
MCH: 32.1 pg (ref 26.0–34.0)
MCHC: 36.1 g/dL — ABNORMAL HIGH (ref 30.0–36.0)
MCV: 89 fL (ref 78.0–100.0)
Monocytes Absolute: 0.6 10*3/uL (ref 0.1–1.0)
Monocytes Relative: 8 %
Neutro Abs: 3.3 10*3/uL (ref 1.7–7.7)
Neutrophils Relative %: 49 %
Platelets: 175 10*3/uL (ref 150–400)
RBC: 4.98 MIL/uL (ref 4.22–5.81)
RDW: 13 % (ref 11.5–15.5)
WBC: 6.8 10*3/uL (ref 4.0–10.5)

## 2015-01-09 LAB — COMPREHENSIVE METABOLIC PANEL
ALT: 19 U/L (ref 17–63)
AST: 21 U/L (ref 15–41)
Albumin: 4.1 g/dL (ref 3.5–5.0)
Alkaline Phosphatase: 70 U/L (ref 38–126)
Anion gap: 6 (ref 5–15)
BUN: 16 mg/dL (ref 6–20)
CO2: 30 mmol/L (ref 22–32)
Calcium: 9.3 mg/dL (ref 8.9–10.3)
Chloride: 103 mmol/L (ref 101–111)
Creatinine, Ser: 0.92 mg/dL (ref 0.61–1.24)
GFR calc Af Amer: >60 mL/min (ref >60)
GFR calc non Af Amer: >60 mL/min (ref >60)
Glucose, Bld: 218 mg/dL — ABNORMAL HIGH (ref 65–99)
Potassium: 4 mmol/L (ref 3.5–5.1)
Sodium: 139 mmol/L (ref 135–145)
Total Bilirubin: 1.2 mg/dL (ref 0.3–1.2)
Total Protein: 7.5 g/dL (ref 6.5–8.1)

## 2015-01-09 LAB — PSA: PSA: 12.53 ng/mL — ABNORMAL HIGH (ref 0.00–4.00)

## 2015-01-09 MED ORDER — DENOSUMAB 120 MG/1.7ML ~~LOC~~ SOLN
120.0000 mg | Freq: Once | SUBCUTANEOUS | Status: AC
  Administered 2015-01-09: 120 mg via SUBCUTANEOUS
  Filled 2015-01-09: qty 1.7

## 2015-01-09 NOTE — Patient Instructions (Signed)
[place] at [facility] Discharge Instructions  RECOMMENDATIONS MADE BY THE CONSULTANT AND ANY TEST RESULTS WILL BE SENT TO YOUR REFERRING PHYSICIAN.  Xgeva 120 mg injection given today as ordered. Return as scheduled.  Thank you for choosing [place] at [facility] to provide your oncology and hematology care.  To afford each patient quality time with our provider, please arrive at least 15 minutes before your scheduled appointment time.    You need to re-schedule your appointment should you arrive 10 or more minutes late.  We strive to give you quality time with our providers, and arriving late affects you and other patients whose appointments are after yours.  Also, if you no show three or more times for appointments you may be dismissed from the clinic at the providers discretion.     Again, thank you for choosing [facility].  Our hope is that these requests will decrease the amount of time that you wait before being seen by our physicians.       _____________________________________________________________  Should you have questions after your visit to [facility], please contact our office at [phone] between the hours of 8:30 a.m. and 4:30 p.m.  Voicemails left after 4:30 p.m. will not be returned until the following business day.  For prescription refill requests, have your pharmacy contact our office.

## 2015-01-09 NOTE — Progress Notes (Signed)
[name] presents today for injection per MD orders. Xgeva 120 mg  administered SQ in left Abdomen. Administration without incident. Patient tolerated well.    

## 2015-01-10 LAB — VITAMIN D 25 HYDROXY (VIT D DEFICIENCY, FRACTURES): Vit D, 25-Hydroxy: 50.8 ng/mL (ref 30.0–100.0)

## 2015-01-10 NOTE — Progress Notes (Signed)
Labs drawn

## 2015-01-15 ENCOUNTER — Encounter (HOSPITAL_COMMUNITY): Payer: Self-pay | Admitting: Hematology & Oncology

## 2015-01-15 ENCOUNTER — Encounter (HOSPITAL_BASED_OUTPATIENT_CLINIC_OR_DEPARTMENT_OTHER): Payer: Medicare Other | Admitting: Hematology & Oncology

## 2015-01-15 ENCOUNTER — Telehealth (HOSPITAL_COMMUNITY): Payer: Self-pay | Admitting: *Deleted

## 2015-01-15 VITALS — BP 153/76 | HR 76 | Temp 97.8°F | Resp 16 | Wt 186.0 lb

## 2015-01-15 DIAGNOSIS — C61 Malignant neoplasm of prostate: Secondary | ICD-10-CM

## 2015-01-15 MED ORDER — PREDNISONE 5 MG PO TABS: 5.0000 mg | ORAL_TABLET | Freq: Two times a day (BID) | ORAL | Status: DC

## 2015-01-15 NOTE — Progress Notes (Signed)
[name],[name], MD [place] /  [place] [address]   DIAGNOSIS: NHL (non-Hodgkin's lymphoma)   Staging form: Lymphoid Neoplasms, AJCC 6th Edition     Clinical: Stage III - Signed by [name] on 08/18/2011  1. 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 Significant vasomotor instability on full dose Casodex.  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 Diffuse large B-cell lymphoma clinically stage IIIAS STATUS post cervical lymph node biopsy on 08/03/2003. His lymphoma CD20 positive and PET scan showed positivity in the spleen. Bone marrow aspiration and biopsy were negative however. He received R. CHOP x6 cycles with a complete remission established by PET scan criteria on 11/23/2003. He completed 6 full cycles total. He remains without disease recurrence. He remains free of B. symptomatology.   CURRENT THERAPY: Lupron every 6 months, XGEVA  INTERVAL HISTORY: [name] 81 y.o. male returns for follow up. He has finally heard that his ZYTIGA is approved.  He spoke with a representative yesterday and was told that his medication would be delivered to him this week.   He has been active cutting the grass, doing bicycle exercised, and washing his car.  He notes he able to work 2-3 hours without feeling fatigued.  He admits he is still worrying a bit. He states that it is been very stressful the last several weeks waiting to see if his medication is approved or not. Where that his PSA has continued to rise. He denies experiencing any new pain.    MEDICAL HISTORY: Past Medical History  Diagnosis Date  . Hypertension   . Atrial fibrillation     [name] follows saw Foster  . 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   . NHL (non-Hodgkin's lymphoma) 08/18/2011    Diffuse large B-cell lymphoma, clinically stage IIIA, CD20 positive, status post cervical lymph node biopsy 08/03/2003 on the left. PET scan was also positive in the spleen and small bowel region, but bone marrow aspiration and biopsy were negative. So he essentially had stage IIIAs. He received R-CHOP x6 cycles with CR established by PET scan criteria on 11/23/2003 with no evidence for relapse th  . Cataracts, both eyes     surgery planned May 2015  . Bladder stones     tx. with oral meds and antibiotics, now surgery planned  . Diabetes mellitus without complication     has HYPERTENSION, UNSPECIFIED; ATRIAL FIBRILLATION; Long term (current) use of anticoagulants; NHL (non-Hodgkin's lymphoma); GIST (gastrointestinal stromal tumor), malignant; Encounter for therapeutic drug monitoring; Hypertrophy of prostate with urinary obstruction and other lower urinary tract symptoms (LUTS); and Prostate Foster on his problem list.     has No Known Allergies.  Current Outpatient Prescriptions on File Prior to Visit  Medication Sig Dispense Refill  . acetaminophen (TYLENOL) 500 MG tablet Take 500 mg by mouth every 6 (six) hours as needed for mild pain.     Marland Kitchen amitriptyline (ELAVIL) 25 MG tablet Take 25 mg by mouth at bedtime.    . Calcium Carb-Cholecalciferol 600-800 MG-UNIT  TABS Take 1 capsule by mouth at bedtime.    . Cholecalciferol (VITAMIN D) 2000 UNITS CAPS Take 1 capsule by mouth daily.    . Cranberry 200 MG CAPS Take 4,200 mg by mouth every morning.     . diltiazem (DILACOR XR) 180 MG 24 hr capsule Take 1 capsule (180 mg total) by mouth every evening. 90 capsule 3  . Docusate Calcium (STOOL SOFTENER PO) Take 1 tablet by mouth 2 (two) times  daily.     Marland Kitchen glipiZIDE (GLUCOTROL) 5 MG tablet Take by mouth daily before breakfast.    . Leuprolide Acetate, 6 Month, (LUPRON DEPOT) 45 MG injection Inject 45 mg into the muscle every 6 (six) months.    . linagliptin (TRADJENTA) 5 MG TABS tablet Take 5 mg by mouth at bedtime.     Marland Kitchen lisinopril-hydrochlorothiazide (PRINZIDE,ZESTORETIC) 20-25 MG per tablet TAKE ONE TABLET BY MOUTH ONCE DAILY 30 tablet 6  . omeprazole (PRILOSEC) 20 MG capsule Take 20 mg by mouth daily.     . potassium chloride (K-DUR,KLOR-CON) 10 MEQ tablet Take 1 tablet (10 mEq total) by mouth daily. 90 tablet 3  . SENNA PO Take 1 capsule by mouth as needed.    . warfarin (COUMADIN) 2.5 MG tablet Take as directed by [facility] (Patient taking differently: Take as directed by [facility] 1 pill on m-w-f and 2 pills other days) 60 tablet 3  . abiraterone Acetate (ZYTIGA) 250 MG tablet Take 4 tablets (1,000 mg total) by mouth daily. Take on an empty stomach 1 hour before or 2 hours after a meal (Patient not taking: Reported on 01/02/2015) 120 tablet 0  . bicalutamide (CASODEX) 50 MG tablet Take 25 mg by mouth daily.    . predniSONE (DELTASONE) 5 MG tablet Take 1 tablet (5 mg total) by mouth 2 (two) times daily with a meal. (Patient not taking: Reported on 01/02/2015) 60 tablet 6   No current facility-administered medications on file prior to visit.    SURGICAL HISTORY: Past Surgical History  Procedure Laterality Date  . Cholecystectomy    . Incision and drainage perirectal abscess    . Portacath placement      insertion and removal -last chemotherapy 10 yrs ago  . Lymph node dissection Left     '05  . Port-a-cath removal    . Transurethral resection of prostate N/A 08/10/2013    Procedure: TRANSURETHRAL RESECTION OF THE PROSTATE WITH GYRUS INSTRUMENTS;  Surgeon: [name], MD;  Location: [facility];  Service: Urology;  Laterality: N/A;  . Cystoscopy with litholapaxy N/A 08/10/2013    Procedure: CYSTOSCOPY WITH  LITHOLAPAXY WITH [name];  Surgeon: [name], MD;  Location: [facility];  Service: Urology;  Laterality: N/A;  . Cataract extraction Right 08/2013    SOCIAL HISTORY: Social History   Social History  . Marital Status: Married    Spouse Name: N/A  . Number of Children: N/A  . Years of Education: N/A   Occupational History  . retired     Hotel manager   Social History Main Topics  . Smoking status: Never Smoker   . Smokeless tobacco: Never Used  . Alcohol Use: No  . Drug Use: No  . Sexual Activity: Not Currently   Other Topics Concern  . Not on file   Social History Narrative  He has 8 grandchildren. He takes care of his garden and mows his lawn. 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  FAMILY HISTORY: History reviewed. No pertinent family history.  Father died  of pneumonia when the patient was 73 year of age Mother died of MI at 48 All of his siblings are deceased  Review of Systems  Constitutional: Negative for fever, chills, weight loss and malaise/fatigue, hot flashes.  HENT: Negative for congestion, hearing loss, nosebleeds, sore throat and tinnitus.   Eyes: Negative for blurred vision, double vision, pain and discharge.  Respiratory: Negative for cough, hemoptysis, sputum production, shortness of breath and wheezing.   Cardiovascular: Negative for chest pain, palpitations, claudication, leg swelling and PND.  Gastrointestinal: Negative for heartburn, nausea, vomiting, abdominal pain, diarrhea, constipation, blood in stool and melena.  Genitourinary: Negative for dysuria, urgency, frequency and hematuria.  Musculoskeletal: Negative for myalgias, joint pain and falls.  Skin: Negative for itching and rash.  Neurological: Negative for dizziness, tingling, tremors, sensory change, speech change, focal weakness, seizures, loss of consciousness, weakness and headaches.  Endo/Heme/Allergies: Does not bruise/bleed easily.  Psychiatric/Behavioral: Negative for depression, suicidal ideas,  memory loss and substance abuse. The patient is not nervous/anxious and does not have insomnia.    14 point review of systems was performed and is negative except as detailed under history of present illness and above   PHYSICAL EXAMINATION  ECOG PERFORMANCE STATUS: 0 - Asymptomatic  Filed Vitals:   01/15/15 0800  BP: 153/76  Pulse: 76  Temp: 97.8 F (36.6 C)  Resp: 16    Physical Exam  Constitutional: He is oriented to person, place, and time and well-developed, well-nourished, and in no distress. Appears younger than stated age HENT:  Head: Normocephalic and atraumatic.  Nose: Nose normal.  Mouth/Throat: Oropharynx is clear and moist. No oropharyngeal exudate.  Eyes: Conjunctivae and EOM are normal. Pupils are equal, round, and reactive to light. Right eye exhibits no discharge. Left eye exhibits no discharge. No scleral icterus.  Neck: Normal range of motion. Neck supple. No tracheal deviation present. No thyromegaly present.  Cardiovascular: Normal rate, regular rhythm and normal heart sounds.  Exam reveals no gallop and no friction rub.   No murmur heard. Pulmonary/Chest: Effort normal and breath sounds normal. He has no wheezes. He has no rales.  Abdominal: Soft. Bowel sounds are normal. He exhibits no distension and no mass. There is no tenderness. There is no rebound and no guarding.  Musculoskeletal: Normal range of motion. He exhibits no edema.  Lymphadenopathy:    He has no cervical adenopathy.  Neurological: He is alert and oriented to person, place, and time. He has normal reflexes. No cranial nerve deficit. Gait normal. Coordination normal.  Skin: Skin is warm and dry. No rash noted.  Psychiatric: Mood, memory, affect and judgment normal.  Nursing note and vitals reviewed.  LABORATORY DATA: I have reviewed the labs below.  CBC    Component Value Date/Time   WBC 6.8 01/09/2015 1023   RBC 4.98 01/09/2015 1023   RBC 4.77 09/08/2013 1047   HGB 16.0 01/09/2015  1023   HCT 44.3 01/09/2015 1023   PLT 175 01/09/2015 1023   MCV 89.0 01/09/2015 1023   MCH 32.1 01/09/2015 1023   MCHC 36.1* 01/09/2015 1023   RDW 13.0 01/09/2015 1023   LYMPHSABS 2.8 01/09/2015 1023   MONOABS 0.6 01/09/2015 1023   EOSABS 0.1 01/09/2015 1023   BASOSABS 0.0 01/09/2015 1023   CMP     Component Value Date/Time   NA 139 01/09/2015 1023   K 4.0 01/09/2015 1023   CL 103 01/09/2015 1023   CO2 30 01/09/2015 1023   GLUCOSE 218* 01/09/2015 1023  BUN 16 01/09/2015 1023   CREATININE 0.92 01/09/2015 1023   CREATININE 1.14 07/27/2013 1004   CALCIUM 9.3 01/09/2015 1023   PROT 7.5 01/09/2015 1023   ALBUMIN 4.1 01/09/2015 1023   AST 21 01/09/2015 1023   ALT 19 01/09/2015 1023   ALKPHOS 70 01/09/2015 1023   BILITOT 1.2 01/09/2015 1023   GFRNONAA >60 01/09/2015 1023   GFRNONAA 58* 07/27/2013 1004   GFRAA >60 01/09/2015 1023   GFRAA 66 07/27/2013 1004    RADIOLOGY:  EXAM: DUAL X-RAY ABSORPTIOMETRY (DXA) FOR BONE MINERAL DENSITY  IMPRESSION: Ordering Physician: Dr. Patrici Ranks,  Your patient KAYLA WEEKES completed a BMD test on 09/19/2014 using the [employer] (software version: 14.10) manufactured by [employer]. The following summarizes the results of our evaluation. PATIENT BIOGRAPHICAL: Name: JUANITO, GONYER Patient ID: [id number] Birth Date: December 31, 1923 Height: 70.5 in. Gender: Male Exam Date: 09/19/2014 Weight: 188.0 lbs. Indications: Caucasian, Height Loss, Low Calcium Intake, Prostate Foster Fractures: Treatments: DENSITOMETRY RESULTS: Site Region Measured Date Measured Age WHO Classification Young Adult T-score BMD %Change vs. Previous Significant Change (*) DualFemur Neck Right 09[age]19/2014 88.6 N[age]A -0.7 0.975 g/cm2  Left Forearm Radius 33% 09/19/2014 88.6 N[age]A -0.5 0.767 g/cm2 ASSESSMENT: BMD as determined from Femur Neck Right is 0.975 g/cm2 with a T-Score of -0.7. This patient is considered  normal by World Healh Organization [facility]) Criteria. (Lumbar spine was not utilized due to advanced degenerative changes.) (Patient does not meet criteria for FRAX assessment.)  World Health Organization [facility]) criteria for post-menopausal, Caucasian Women: Normal: T-score at or above -1 SD Osteopenia: T-score between -1 and -2.5 SD Osteoporosis: T-score at or below -2.5 SD  RECOMMENDATIONS: [place] recommends that FDA-approved medial therapies be considered in postmenopausal women and men age 79 or older with a: 1. Hip or vertberal (clinical or morphometric) fracture. 2. T-Score of < -2.5 at the spine or hip. 3. Ten-year fracture probability by FRAX of 3% or greater for hip fracture or 20% or greater for major osteoporotic fracture.  All treatment decisions require clinical judgment and consideration of indiviual patient factors, including patient preferences, co-morbidities, previous drug use, risk factors not captured in the FRAX model (e.g. falls, vitamin D deficiency, increased bone turnover, interval significant decline in bone density) and possible under-or over-estimation of fracture risk by FRAX.  All patients should ensure an adequate intake of dietary calcium (1200 mg/d) and vitamin D (800 IU daily) unless contraindicated.  FOLLOW-UP: People with diagnosed cases of osteoporosis or osteopenia should be regularly tested for bone mineral density. For patients eligible for Medicare, routine testing is allowed once every 2 years. Testing frequency can be increased for patients who have rapidly progressing disease, or for those who are receiving medical therapy to restore bone mass.  I have reviewed this report, and agree with the above findings.  [facility]   Electronically Signed  By: Rolm Baptise M.D.  On: 09/19/2014 09:42    CLINICAL DATA: Prostate Foster diagnosed in 2005 with recent bladder stones.  Rising PSA. Diabetes. Hypertension. Lymphoma in 2013. Gastrointestinal stromal tumor in 2013.  EXAM: CT CHEST, ABDOMEN, AND PELVIS WITH CONTRAST IMPRESSION: 1. Osseous metastasis. When correlated with today's bone scan, felt to be slightly progressive. 2. No evidence of extraosseous metastasis. 3. Esophageal air fluid level suggests dysmotility or gastroesophageal reflux. 4. Small bladder saccules, suggesting a component of outlet obstruction.   Electronically Signed  By: Abigail Miyamoto M.D.  On: 10/05/2014 16:13  ASSESSMENT and THERAPY PLAN:  Stage IV adenocarcinoma of the prostate Lupron therapy/Casodex Rising PSA with value of 3.11 ng/ml on 09/10/2014  DEXA with normal bone density XGEVA monthly Casodex discontinued with continued rise of PSA   He is to continue on his Lupron and Xgeva. He is to continue on his calcium and vitamin D. Once he receives his fatigue at this week he is instructed to start the medication, he will also be on prednisone 5 mg twice daily. We will arrange for formal teaching today. I will plan on seeing the patient back in 2 weeks. He and I briefly reviewed side effects and symptoms of concern. I did advise the patient however that most men tolerate these medications quite well. I advised him that we will not check a PSA until he has been on the medication for at least 6-8 weeks.  Orders Placed This Encounter  Procedures  . CBC with Differential    Standing Status: Future     Number of Occurrences:      Standing Expiration Date: 01/15/2016  . Comprehensive metabolic panel    Standing Status: Future     Number of Occurrences:      Standing Expiration Date: 01/15/2016   All questions were answered. The patient knows to call the clinic with any problems, questions or concerns. We can certainly see the patient much sooner if necessary.   This document serves as a record of services personally performed by Ancil Linsey, MD. It was created on her  behalf by Janace Hoard, a trained medical scribe. The creation of this record is based on the scribe's personal observations and the provider's statements to them. This document has been checked and approved by the attending provider.  I have reviewed the above documentation for accuracy and completeness, and I agree with the above.  This note was electronically signed.  Kelby Fam. Penland MD

## 2015-01-15 NOTE — Addendum Note (Signed)
Addended by: Gerhard Perches on: 01/15/2015 04:16 PM   Modules accepted: Orders

## 2015-01-15 NOTE — Patient Instructions (Addendum)
[place] at [facility] Discharge Instructions  RECOMMENDATIONS MADE BY THE CONSULTANT AND ANY TEST RESULTS WILL BE SENT TO YOUR REFERRING PHYSICIAN.  Exam completed by Dr Whitney Muse today Return to see the doctor in 2 weeks [name] will be contacting you about teaching you on your zytiga Please call the clinic if you have any questions or concerns   Thank you for choosing [place] at [facility] to provide your oncology and hematology care.  To afford each patient quality time with our provider, please arrive at least 15 minutes before your scheduled appointment time.    You need to re-schedule your appointment should you arrive 10 or more minutes late.  We strive to give you quality time with our providers, and arriving late affects you and other patients whose appointments are after yours.  Also, if you no show three or more times for appointments you may be dismissed from the clinic at the providers discretion.     Again, thank you for choosing [facility].  Our hope is that these requests will decrease the amount of time that you wait before being seen by our physicians.       _____________________________________________________________  Should you have questions after your visit to [facility], please contact our office at [phone] between the hours of 8:30 a.m. and 4:30 p.m.  Voicemails left after 4:30 p.m. will not be returned until the following business day.  For prescription refill requests, have your pharmacy contact our office.

## 2015-01-15 NOTE — Telephone Encounter (Signed)
Prednisone has been escribed to [facility]. Patient instructed that he will take Prednisone twice a day. Patient awaiting Zytiga to be delivered. Please see Angie for any questions as to why patient doesn't have medication yet. Patient instructed to take Zytiga 2 hours before or 2 hours after a meal. He said ok and verbalized understanding of these instructions. Formal teaching done several weeks ago in person with patient.

## 2015-01-16 ENCOUNTER — Other Ambulatory Visit (HOSPITAL_COMMUNITY): Payer: Self-pay | Admitting: Hematology & Oncology

## 2015-01-16 ENCOUNTER — Other Ambulatory Visit (HOSPITAL_COMMUNITY): Payer: Self-pay | Admitting: Emergency Medicine

## 2015-01-16 ENCOUNTER — Ambulatory Visit (INDEPENDENT_AMBULATORY_CARE_PROVIDER_SITE_OTHER): Payer: Medicare Other | Admitting: *Deleted

## 2015-01-16 ENCOUNTER — Telehealth (HOSPITAL_COMMUNITY): Payer: Self-pay | Admitting: *Deleted

## 2015-01-16 DIAGNOSIS — Z5181 Encounter for therapeutic drug level monitoring: Secondary | ICD-10-CM

## 2015-01-16 DIAGNOSIS — Z7901 Long term (current) use of anticoagulants: Secondary | ICD-10-CM

## 2015-01-16 DIAGNOSIS — C61 Malignant neoplasm of prostate: Secondary | ICD-10-CM

## 2015-01-16 DIAGNOSIS — I4891 Unspecified atrial fibrillation: Secondary | ICD-10-CM | POA: Diagnosis not present

## 2015-01-16 LAB — POCT INR: INR: 2.6

## 2015-01-16 MED ORDER — ABIRATERONE ACETATE 250 MG PO TABS: 1000.0000 mg | ORAL_TABLET | Freq: Every day | ORAL | Status: DC

## 2015-01-16 MED ORDER — PREDNISONE 5 MG PO TABS: 5.0000 mg | ORAL_TABLET | Freq: Two times a day (BID) | ORAL | Status: DC

## 2015-01-16 NOTE — Telephone Encounter (Signed)
Call from [facility] regarding a possible drug interaction. Pharmacist Margarita Grizzle is faxing [place] information on Zytiga drug interaction with amitriptyline, glipizide, trajenta and warfarin. We need to let Dr. Whitney Muse review for her approval and call them back.

## 2015-01-21 ENCOUNTER — Encounter (HOSPITAL_COMMUNITY): Payer: Self-pay | Admitting: *Deleted

## 2015-01-21 ENCOUNTER — Telehealth (HOSPITAL_COMMUNITY): Payer: Self-pay | Admitting: *Deleted

## 2015-01-21 NOTE — Telephone Encounter (Signed)
Patient states that since starting Zytiga on Saturday January 19, 2015 his blood pressure has gone up. He states that it is usually in the 130s/80s however since Saturday Oct 1 it has been 161/91, Sun Oct 2 it was 162/84, Mon Oct 3 it was 151/86. I told him to continue to watch it and keep a journal of blood pressures and to be in touch with me regarding his blood pressures. He said ok. He told me that his blood sugars have been running around the 130s which is good for him. I told him to continue to monitor that since he is taking Prednisone and that Prednisone can increase blood sugars. He is getting up to void 5-6 times a night which is new since starting Zytiga. I asked him if he ankles were swollen during the day and he said no. Otherwise, he is doing great. I will mark the start date of Zytiga on his med list as 01/19/15.

## 2015-01-23 DIAGNOSIS — Z23 Encounter for immunization: Secondary | ICD-10-CM | POA: Diagnosis not present

## 2015-01-24 ENCOUNTER — Telehealth (HOSPITAL_COMMUNITY): Payer: Self-pay | Admitting: *Deleted

## 2015-01-25 ENCOUNTER — Encounter (HOSPITAL_COMMUNITY): Payer: Medicare Other | Attending: Hematology & Oncology | Admitting: Oncology

## 2015-01-25 DIAGNOSIS — C8590 Non-Hodgkin lymphoma, unspecified, unspecified site: Secondary | ICD-10-CM | POA: Diagnosis not present

## 2015-01-25 DIAGNOSIS — C7951 Secondary malignant neoplasm of bone: Secondary | ICD-10-CM | POA: Diagnosis not present

## 2015-01-25 DIAGNOSIS — C61 Malignant neoplasm of prostate: Secondary | ICD-10-CM

## 2015-01-25 LAB — CBC WITH DIFFERENTIAL/PLATELET
Basophils Absolute: 0 10*3/uL (ref 0.0–0.1)
Basophils Relative: 0 %
Eosinophils Absolute: 0.1 10*3/uL (ref 0.0–0.7)
Eosinophils Relative: 1 %
HCT: 45.2 % (ref 39.0–52.0)
Hemoglobin: 16.3 g/dL (ref 13.0–17.0)
Lymphocytes Relative: 31 %
Lymphs Abs: 2.8 10*3/uL (ref 0.7–4.0)
MCH: 32.4 pg (ref 26.0–34.0)
MCHC: 36.1 g/dL — ABNORMAL HIGH (ref 30.0–36.0)
MCV: 89.9 fL (ref 78.0–100.0)
Monocytes Absolute: 0.9 10*3/uL (ref 0.1–1.0)
Monocytes Relative: 10 %
Neutro Abs: 5.1 10*3/uL (ref 1.7–7.7)
Neutrophils Relative %: 58 %
Platelets: 169 10*3/uL (ref 150–400)
RBC: 5.03 MIL/uL (ref 4.22–5.81)
RDW: 13.1 % (ref 11.5–15.5)
WBC: 8.9 10*3/uL (ref 4.0–10.5)

## 2015-01-25 LAB — COMPREHENSIVE METABOLIC PANEL
ALT: 20 U/L (ref 17–63)
AST: 20 U/L (ref 15–41)
Albumin: 4 g/dL (ref 3.5–5.0)
Alkaline Phosphatase: 85 U/L (ref 38–126)
Anion gap: 8 (ref 5–15)
BUN: 19 mg/dL (ref 6–20)
CO2: 29 mmol/L (ref 22–32)
Calcium: 9.8 mg/dL (ref 8.9–10.3)
Chloride: 101 mmol/L (ref 101–111)
Creatinine, Ser: 0.91 mg/dL (ref 0.61–1.24)
GFR calc Af Amer: >60 mL/min (ref >60)
GFR calc non Af Amer: >60 mL/min (ref >60)
Glucose, Bld: 157 mg/dL — ABNORMAL HIGH (ref 65–99)
Potassium: 4.3 mmol/L (ref 3.5–5.1)
Sodium: 138 mmol/L (ref 135–145)
Total Bilirubin: 0.8 mg/dL (ref 0.3–1.2)
Total Protein: 7.4 g/dL (ref 6.5–8.1)

## 2015-01-25 LAB — PSA: PSA: 12.22 ng/mL — ABNORMAL HIGH (ref 0.00–4.00)

## 2015-01-25 NOTE — Patient Instructions (Signed)
[place] at [facility] Discharge Instructions  RECOMMENDATIONS MADE BY THE CONSULTANT AND ANY TEST RESULTS WILL BE SENT TO YOUR REFERRING PHYSICIAN.  We did lab work today to be sure that you were not dehydrated.   Please be sure to call Dr. Harl Bowie to clarify your blood pressure medication dosing.   Return as scheduled, 01/29/2015.    Thank you for choosing [place] at [facility] to provide your oncology and hematology care.  To afford each patient quality time with our provider, please arrive at least 15 minutes before your scheduled appointment time.    You need to re-schedule your appointment should you arrive 10 or more minutes late.  We strive to give you quality time with our providers, and arriving late affects you and other patients whose appointments are after yours.  Also, if you no show three or more times for appointments you may be dismissed from the clinic at the providers discretion.     Again, thank you for choosing [facility].  Our hope is that these requests will decrease the amount of time that you wait before being seen by our physicians.       _____________________________________________________________  Should you have questions after your visit to [facility], please contact our office at [phone] between the hours of 8:30 a.m. and 4:30 p.m.  Voicemails left after 4:30 p.m. will not be returned until the following business day.  For prescription refill requests, have your pharmacy contact our office.
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
## 2015-01-29 ENCOUNTER — Encounter (HOSPITAL_BASED_OUTPATIENT_CLINIC_OR_DEPARTMENT_OTHER): Payer: Medicare Other | Admitting: Hematology & Oncology

## 2015-01-29 ENCOUNTER — Encounter (HOSPITAL_COMMUNITY): Payer: Medicare Other

## 2015-01-29 ENCOUNTER — Other Ambulatory Visit: Payer: Self-pay

## 2015-01-29 DIAGNOSIS — C7951 Secondary malignant neoplasm of bone: Secondary | ICD-10-CM | POA: Diagnosis not present

## 2015-01-29 DIAGNOSIS — B351 Tinea unguium: Secondary | ICD-10-CM | POA: Diagnosis not present

## 2015-01-29 DIAGNOSIS — E1142 Type 2 diabetes mellitus with diabetic polyneuropathy: Secondary | ICD-10-CM | POA: Diagnosis not present

## 2015-01-29 DIAGNOSIS — C61 Malignant neoplasm of prostate: Secondary | ICD-10-CM | POA: Diagnosis not present

## 2015-01-29 MED ORDER — LISINOPRIL-HYDROCHLOROTHIAZIDE 20-25 MG PO TABS: 1.0000 | ORAL_TABLET | Freq: Every day | ORAL | Status: DC

## 2015-01-29 NOTE — Progress Notes (Signed)
[name],[name], MD [place] / [place] [address]   DIAGNOSIS: NHL (non-Hodgkin's lymphoma)   Staging form: Lymphoid Neoplasms, AJCC 6th Edition     Clinical: Stage III - Signed by Baird Cancer, PA on 08/18/2011  #1. 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 Significant vasomotor instability on full dose Casodex.  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 Margot Chimes in October 2005  #3. Diffuse large B-cell lymphoma clinically stage IIIAS STATUS post cervical lymph node biopsy on 08/03/2003. His lymphoma CD20 positive and PET scan showed positivity in the spleen. Bone marrow aspiration and biopsy were negative however. He received R. CHOP x6 cycles with a complete remission established by PET scan criteria on 11/23/2003. He completed 6 full cycles total. He remains without disease recurrence. He remains free of B. symptomatology.   CURRENT THERAPY: Lupron every 6 months, XGEVA  INTERVAL HISTORY: [name] 81 y.o. male returns for follow up. He has finally started his ZYTIGA.  The patient states that he has been doing well on his medication.  He notes that he had 1 dizzy spell but this has not occurred any more.  He has some weakness.  He is still active cutting the grass. He would like to take a vacation to [place] in January.  He has had his flu vaccination.  He take Prednisone twice/day. Denies nausea.  Denies abdominal pain.  MEDICAL HISTORY: Past Medical History  Diagnosis Date   Hypertension    Atrial fibrillation     Dr. Harl Bowie- [facility] follows saw 11'14   GIST (gastrointestinal stromal tumor), malignant 08/18/2011    Gastrointestinal stromal tumor, that is GIST, small bowel, 4.5 cm, intermediate prognostic grade found on the PET scan in the small bowel, accounting for that small bowel activity in  October 2005 with resection by Dr. Margot Chimes, thus far without recurrence.    NHL (non-Hodgkin's lymphoma) 08/18/2011    Diffuse large B-cell lymphoma, clinically stage IIIA, CD20 positive, status post cervical lymph node biopsy 08/03/2003 on the left. PET scan was also positive in the spleen and small bowel region, but bone marrow aspiration and biopsy were negative. So he essentially had stage IIIAs. He received R-CHOP x6 cycles with CR established by PET scan criteria on 11/23/2003 with no evidence for relapse th   Cataracts, both eyes     surgery planned May 2015   Bladder stones     tx. with oral meds and antibiotics, now surgery planned   Diabetes mellitus without complication     has HYPERTENSION, UNSPECIFIED; ATRIAL FIBRILLATION; Long term (current) use of anticoagulants; NHL (non-Hodgkin's lymphoma) ([place]); GIST (gastrointestinal stromal tumor), malignant ([place]); Encounter for therapeutic drug monitoring; Hypertrophy of prostate with urinary obstruction and other lower urinary tract symptoms (LUTS); and Prostate cancer ([place]) on his problem list.     has No Known Allergies.  Current Outpatient Prescriptions on File Prior to Visit  Medication Sig Dispense Refill   abiraterone Acetate (ZYTIGA) 250 MG tablet Take 4 tablets (1,000 mg total) by mouth daily. Take on an empty stomach 1 hour before or 2 hours after a meal 120 tablet 6   acetaminophen (TYLENOL) 500 MG tablet Take 500 mg by mouth every 6 (six) hours as needed for mild pain.      amitriptyline (ELAVIL) 25 MG tablet Take 25 mg by  mouth at bedtime.     bicalutamide (CASODEX) 50 MG tablet Take 25 mg by mouth daily.     Calcium Carb-Cholecalciferol 600-800 MG-UNIT TABS Take 1 capsule by mouth at bedtime.     Cholecalciferol (VITAMIN D) 2000 UNITS CAPS Take 1 capsule by mouth daily.     Cranberry 200 MG CAPS Take 4,200 mg by mouth every morning.      diltiazem (DILACOR XR) 180 MG 24 hr capsule Take 1 capsule (180 mg total) by  mouth every evening. 90 capsule 3   Docusate Calcium (STOOL SOFTENER PO) Take 1 tablet by mouth 2 (two) times daily.      glipiZIDE (GLUCOTROL) 5 MG tablet Take by mouth daily before breakfast.     Leuprolide Acetate, 6 Month, (LUPRON DEPOT) 45 MG injection Inject 45 mg into the muscle every 6 (six) months.     linagliptin (TRADJENTA) 5 MG TABS tablet Take 5 mg by mouth at bedtime.      lisinopril-hydrochlorothiazide (PRINZIDE,ZESTORETIC) 20-12.5 MG tablet Take 1 tablet by mouth daily.     omeprazole (PRILOSEC) 20 MG capsule Take 20 mg by mouth daily.      potassium chloride (K-DUR,KLOR-CON) 10 MEQ tablet Take 1 tablet (10 mEq total) by mouth daily. 90 tablet 3   predniSONE (DELTASONE) 5 MG tablet Take 1 tablet (5 mg total) by mouth 2 (two) times daily with a meal. 60 tablet 6   SENNA PO Take 1 capsule by mouth as needed.     warfarin (COUMADIN) 2.5 MG tablet Take as directed by [facility] (Patient taking differently: Take as directed by [facility] 1 pill on m-w-f and 2 pills other days) 60 tablet 3   lisinopril-hydrochlorothiazide (PRINZIDE,ZESTORETIC) 20-25 MG per tablet TAKE ONE TABLET BY MOUTH ONCE DAILY (Patient not taking: Reported on 01/29/2015) 30 tablet 6   No current facility-administered medications on file prior to visit.    SURGICAL HISTORY: Past Surgical History  Procedure Laterality Date   Cholecystectomy     Incision and drainage perirectal abscess     Portacath placement      insertion and removal -last chemotherapy 10 yrs ago   Lymph node dissection Left     '05   Port-a-cath removal     Transurethral resection of prostate N/A 08/10/2013    Procedure: TRANSURETHRAL RESECTION OF THE PROSTATE WITH GYRUS INSTRUMENTS;  Surgeon: [name], MD;  Location: [facility];  Service: Urology;  Laterality: N/A;   Cystoscopy with litholapaxy N/A 08/10/2013    Procedure: CYSTOSCOPY WITH LITHOLAPAXY WITH [name];  Surgeon: [name], MD;   Location: [facility];  Service: Urology;  Laterality: N/A;   Cataract extraction Right 08/2013    SOCIAL HISTORY: Social History   Social History   Marital Status: Married    Spouse Name: N/A   Number of Children: N/A   Years of Education: N/A   Occupational History   retired     Hotel manager   Social History Main Topics   Smoking status: Never Smoker    Smokeless tobacco: Never Used   Alcohol Use: No   Drug Use: No   Sexual Activity: Not Currently   Other Topics Concern   Not on file   Social History Narrative  He has 8 grandchildren. He takes care of his garden and mows his lawn. 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  FAMILY HISTORY: No family history on file.  Father died of pneumonia when the patient was 63 year of age Mother died of MI at  78 All of his siblings are deceased  Review of Systems  Constitutional: Negative for fever, chills, weight loss and malaise/fatigue, hot flashes.  HENT: Negative for congestion, hearing loss, nosebleeds, sore throat and tinnitus.   Eyes: Negative for blurred vision, double vision, pain and discharge.  Respiratory: Negative for cough, hemoptysis, sputum production, shortness of breath and wheezing.   Cardiovascular: Negative for chest pain, palpitations, claudication, leg swelling and PND.  Gastrointestinal: Negative for heartburn, nausea, vomiting, abdominal pain, diarrhea, constipation, blood in stool and melena.  Genitourinary: Negative for dysuria, urgency, frequency and hematuria.  Musculoskeletal: Negative for myalgias, joint pain and falls.  Skin: Negative for itching and rash.  Neurological: Negative for dizziness, tingling, tremors, sensory change, speech change, focal weakness, seizures, loss of consciousness, weakness and headaches.  Endo/Heme/Allergies: Does not bruise/bleed easily.  Psychiatric/Behavioral: Negative for depression, suicidal ideas, memory loss and substance abuse. The patient is not nervous/anxious and does not have  insomnia.    14 point review of systems was performed and is negative except as detailed under history of present illness and above   PHYSICAL EXAMINATION  ECOG PERFORMANCE STATUS: 0 - Asymptomatic  Filed Vitals:   01/29/15 0944  BP: 151/91  Pulse: 94  Resp: 16    Physical Exam  Constitutional: He is oriented to person, place, and time and well-developed, well-nourished, and in no distress. Appears younger than stated age HENT:  Head: Normocephalic and atraumatic.  Nose: Nose normal.  Mouth/Throat: Oropharynx is clear and moist. No oropharyngeal exudate.  Eyes: Conjunctivae and EOM are normal. Pupils are equal, round, and reactive to light. Right eye exhibits no discharge. Left eye exhibits no discharge. No scleral icterus.  Neck: Normal range of motion. Neck supple. No tracheal deviation present. No thyromegaly present.  Cardiovascular: Normal rate, regular rhythm and normal heart sounds.  Exam reveals no gallop and no friction rub.   No murmur heard. Pulmonary/Chest: Effort normal and breath sounds normal. He has no wheezes. He has no rales.  Abdominal: Soft. Bowel sounds are normal. He exhibits no distension and no mass. There is no tenderness. There is no rebound and no guarding.  Musculoskeletal: Normal range of motion. He exhibits no edema.  Lymphadenopathy:    He has no cervical adenopathy.  Neurological: He is alert and oriented to person, place, and time. He has normal reflexes. No cranial nerve deficit. Gait normal. Coordination normal.  Skin: Skin is warm and dry. No rash noted.  Psychiatric: Mood, memory, affect and judgment normal.  Nursing note and vitals reviewed.  LABORATORY DATA: I have reviewed the labs below.  CBC    Component Value Date/Time   WBC 8.9 01/25/2015 1018   RBC 5.03 01/25/2015 1018   RBC 4.77 09/08/2013 1047   HGB 16.3 01/25/2015 1018   HCT 45.2 01/25/2015 1018   PLT 169 01/25/2015 1018   MCV 89.9 01/25/2015 1018   MCH 32.4 01/25/2015  1018   MCHC 36.1* 01/25/2015 1018   RDW 13.1 01/25/2015 1018   LYMPHSABS 2.8 01/25/2015 1018   MONOABS 0.9 01/25/2015 1018   EOSABS 0.1 01/25/2015 1018   BASOSABS 0.0 01/25/2015 1018   CMP     Component Value Date/Time   NA 138 01/25/2015 1018   K 4.3 01/25/2015 1018   CL 101 01/25/2015 1018   CO2 29 01/25/2015 1018   GLUCOSE 157* 01/25/2015 1018   BUN 19 01/25/2015 1018   CREATININE 0.91 01/25/2015 1018   CREATININE 1.14 07/27/2013 1004   CALCIUM 9.8 01/25/2015  1018   PROT 7.4 01/25/2015 1018   ALBUMIN 4.0 01/25/2015 1018   AST 20 01/25/2015 1018   ALT 20 01/25/2015 1018   ALKPHOS 85 01/25/2015 1018   BILITOT 0.8 01/25/2015 1018   GFRNONAA >60 01/25/2015 1018   GFRNONAA 58* 07/27/2013 1004   GFRAA >60 01/25/2015 1018   GFRAA 66 07/27/2013 1004    RADIOLOGY:  EXAM: DUAL X-RAY ABSORPTIOMETRY (DXA) FOR BONE MINERAL DENSITY  IMPRESSION: Ordering Physician: Dr. Patrici Ranks,  Your patient ATREUS HASZ completed a BMD test on 09/19/2014 using the [employer] (software version: 14.10) manufactured by [employer]. The following summarizes the results of our evaluation. PATIENT BIOGRAPHICAL: Name: JABARI, SWOVELAND Patient ID: [id number] Birth Date: 05-24-1923 Height: 70.5 in. Gender: Male Exam Date: 09/19/2014 Weight: 188.0 lbs. Indications: Caucasian, Height Loss, Low Calcium Intake, Prostate Cancer Fractures: Treatments: DENSITOMETRY RESULTS: Site Region Measured Date Measured Age WHO Classification Young Adult T-score BMD %Change vs. Previous Significant Change (*) DualFemur Neck Right 09/19/2014 88.6 N/A -0.7 0.975 g/cm2  Left Forearm Radius 33% 09/19/2014 88.6 N[age]A -0.5 0.767 g/cm2 ASSESSMENT: BMD as determined from Femur Neck Right is 0.975 g/cm2 with a T-Score of -0.7. This patient is considered normal by World Healh Organization [facility]) Criteria. (Lumbar spine was not utilized due to advanced  degenerative changes.) (Patient does not meet criteria for FRAX assessment.)  World Health Organization [facility]) criteria for post-menopausal, Caucasian Women: Normal: T-score at or above -1 SD Osteopenia: T-score between -1 and -2.5 SD Osteoporosis: T-score at or below -2.5 SD  RECOMMENDATIONS: [place] recommends that FDA-approved medial therapies be considered in postmenopausal women and men age 28 or older with a: 1. Hip or vertberal (clinical or morphometric) fracture. 2. T-Score of < -2.5 at the spine or hip. 3. Ten-year fracture probability by FRAX of 3% or greater for hip fracture or 20% or greater for major osteoporotic fracture.  All treatment decisions require clinical judgment and consideration of indiviual patient factors, including patient preferences, co-morbidities, previous drug use, risk factors not captured in the FRAX model (e.g. falls, vitamin D deficiency, increased bone turnover, interval significant decline in bone density) and possible under-or over-estimation of fracture risk by FRAX.  All patients should ensure an adequate intake of dietary calcium (1200 mg/d) and vitamin D (800 IU daily) unless contraindicated.  FOLLOW-UP: People with diagnosed cases of osteoporosis or osteopenia should be regularly tested for bone mineral density. For patients eligible for Medicare, routine testing is allowed once every 2 years. Testing frequency can be increased for patients who have rapidly progressing disease, or for those who are receiving medical therapy to restore bone mass.  I have reviewed this report, and agree with the above findings.  [facility]   Electronically Signed  By: Rolm Baptise M.D.  On: 09/19/2014 09:42   CLINICAL DATA: Prostate cancer diagnosed in 2005 with recent bladder stones. Rising PSA. Diabetes. Hypertension. Lymphoma in 2013. Gastrointestinal stromal tumor in  2013.  EXAM: CT CHEST, ABDOMEN, AND PELVIS WITH CONTRAST IMPRESSION: 1. Osseous metastasis. When correlated with today's bone scan, felt to be slightly progressive. 2. No evidence of extraosseous metastasis. 3. Esophageal air fluid level suggests dysmotility or gastroesophageal reflux. 4. Small bladder saccules, suggesting a component of outlet obstruction.   Electronically Signed  By: Abigail Miyamoto M.D.  On: 10/05/2014 16:13   ASSESSMENT and THERAPY PLAN:  Stage IV adenocarcinoma of the prostate Lupron therapy/Casodex Rising PSA with value of 3.11 ng/ml on 09/10/2014  DEXA with normal bone density XGEVA monthly Casodex discontinued with continued rise of PSA   He is to continue on his Lupron and Xgeva. He is to continue on his calcium and vitamin D. He will continue with his Zytiga and prednisone. He is tolerating it well so far.  I will see him back in one month with labs He will receive his lupron at follow-up. I advised him that if he is doing well on his medications he should be fine to travel to [place] in January.   Orders Placed This Encounter  Procedures   CBC with Differential    Standing Status: Future     Number of Occurrences:      Standing Expiration Date: 01/29/2016   Comprehensive metabolic panel    Standing Status: Future     Number of Occurrences:      Standing Expiration Date: 01/29/2016   PSA    Standing Status: Future     Number of Occurrences:      Standing Expiration Date: 01/29/2016    All questions were answered. The patient knows to call the clinic with any problems, questions or concerns. We can certainly see the patient much sooner if necessary.   This document serves as a record of services personally performed by Ancil Linsey, MD. It was created on her behalf by Janace Hoard, a trained medical scribe. The creation of this record is based on the scribe's personal observations and the provider's statements to them. This  document has been checked and approved by the attending provider.  I have reviewed the above documentation for accuracy and completeness, and I agree with the above.  This note was electronically signed.  Kelby Fam. Penland MD

## 2015-01-29 NOTE — Telephone Encounter (Signed)
Pt states Dr Legrand Rams filled wrong dose of prinzide,dose is 20/25 mg

## 2015-01-29 NOTE — Patient Instructions (Signed)
[place] at [facility] Discharge Instructions  RECOMMENDATIONS MADE BY THE CONSULTANT AND ANY TEST RESULTS WILL BE SENT TO YOUR REFERRING PHYSICIAN.  Exam per Dr.Penland. Continue all medications as instructed. Continue Xgeva injections every 4 weeks. Continue Lupron injection every 6 months. MD appointment with next Lupron injection appointment. Return as scheduled.  Thank you for choosing [place] at [facility] to provide your oncology and hematology care.  To afford each patient quality time with our provider, please arrive at least 15 minutes before your scheduled appointment time.    You need to re-schedule your appointment should you arrive 10 or more minutes late.  We strive to give you quality time with our providers, and arriving late affects you and other patients whose appointments are after yours.  Also, if you no show three or more times for appointments you may be dismissed from the clinic at the providers discretion.     Again, thank you for choosing [facility].  Our hope is that these requests will decrease the amount of time that you wait before being seen by our physicians.       _____________________________________________________________  Should you have questions after your visit to [facility], please contact our office at [phone] between the hours of 8:30 a.m. and 4:30 p.m.  Voicemails left after 4:30 p.m. will not be returned until the following business day.  For prescription refill requests, have your pharmacy contact our office.

## 2015-01-30 ENCOUNTER — Ambulatory Visit (INDEPENDENT_AMBULATORY_CARE_PROVIDER_SITE_OTHER): Payer: Medicare Other | Admitting: *Deleted

## 2015-01-30 ENCOUNTER — Encounter (HOSPITAL_COMMUNITY): Payer: Self-pay | Admitting: Hematology & Oncology

## 2015-01-30 DIAGNOSIS — Z5181 Encounter for therapeutic drug level monitoring: Secondary | ICD-10-CM | POA: Diagnosis not present

## 2015-01-30 DIAGNOSIS — Z7901 Long term (current) use of anticoagulants: Secondary | ICD-10-CM

## 2015-01-30 DIAGNOSIS — I4891 Unspecified atrial fibrillation: Secondary | ICD-10-CM

## 2015-01-30 LAB — POCT INR: INR: 2.2

## 2015-02-06 ENCOUNTER — Encounter (HOSPITAL_COMMUNITY): Payer: Medicare Other

## 2015-02-06 ENCOUNTER — Encounter (HOSPITAL_BASED_OUTPATIENT_CLINIC_OR_DEPARTMENT_OTHER): Payer: Medicare Other

## 2015-02-06 VITALS — BP 150/75 | HR 89 | Temp 97.5°F | Resp 20

## 2015-02-06 DIAGNOSIS — C61 Malignant neoplasm of prostate: Secondary | ICD-10-CM

## 2015-02-06 DIAGNOSIS — C7951 Secondary malignant neoplasm of bone: Secondary | ICD-10-CM

## 2015-02-06 MED ORDER — DENOSUMAB 120 MG/1.7ML ~~LOC~~ SOLN
120.0000 mg | Freq: Once | SUBCUTANEOUS | Status: AC
  Administered 2015-02-06: 120 mg via SUBCUTANEOUS
  Filled 2015-02-06: qty 1.7

## 2015-02-06 NOTE — Patient Instructions (Signed)
[place] at [facility] Discharge Instructions  RECOMMENDATIONS MADE BY THE CONSULTANT AND ANY TEST RESULTS WILL BE SENT TO YOUR REFERRING PHYSICIAN.  Xgeva 120 mg injection given today as ordered. Return as scheduled in November for lab work and MD appointment.  Thank you for choosing [place] at [facility] to provide your oncology and hematology care.  To afford each patient quality time with our provider, please arrive at least 15 minutes before your scheduled appointment time.    You need to re-schedule your appointment should you arrive 10 or more minutes late.  We strive to give you quality time with our providers, and arriving late affects you and other patients whose appointments are after yours.  Also, if you no show three or more times for appointments you may be dismissed from the clinic at the providers discretion.     Again, thank you for choosing [facility].  Our hope is that these requests will decrease the amount of time that you wait before being seen by our physicians.       _____________________________________________________________  Should you have questions after your visit to [facility], please contact our office at [phone] between the hours of 8:30 a.m. and 4:30 p.m.  Voicemails left after 4:30 p.m. will not be returned until the following business day.  For prescription refill requests, have your pharmacy contact our office.

## 2015-02-08 ENCOUNTER — Other Ambulatory Visit: Payer: Self-pay | Admitting: *Deleted

## 2015-02-08 MED ORDER — DILTIAZEM HCL ER 180 MG PO CP24: 180.0000 mg | ORAL_CAPSULE | Freq: Every evening | ORAL | Status: DC

## 2015-02-08 NOTE — Progress Notes (Signed)
LABS DRAWN

## 2015-02-11 DIAGNOSIS — J40 Bronchitis, not specified as acute or chronic: Secondary | ICD-10-CM | POA: Diagnosis not present

## 2015-02-11 DIAGNOSIS — J029 Acute pharyngitis, unspecified: Secondary | ICD-10-CM | POA: Diagnosis not present

## 2015-02-11 DIAGNOSIS — H109 Unspecified conjunctivitis: Secondary | ICD-10-CM | POA: Diagnosis not present

## 2015-02-20 ENCOUNTER — Ambulatory Visit (INDEPENDENT_AMBULATORY_CARE_PROVIDER_SITE_OTHER): Payer: Medicare Other | Admitting: *Deleted

## 2015-02-20 DIAGNOSIS — I4891 Unspecified atrial fibrillation: Secondary | ICD-10-CM | POA: Diagnosis not present

## 2015-02-20 DIAGNOSIS — Z5181 Encounter for therapeutic drug level monitoring: Secondary | ICD-10-CM

## 2015-02-20 DIAGNOSIS — Z7901 Long term (current) use of anticoagulants: Secondary | ICD-10-CM | POA: Diagnosis not present

## 2015-02-20 LAB — POCT INR: INR: 2.8

## 2015-03-06 ENCOUNTER — Encounter (HOSPITAL_COMMUNITY): Payer: Medicare Other | Attending: Hematology & Oncology

## 2015-03-06 ENCOUNTER — Other Ambulatory Visit: Payer: Self-pay | Admitting: Cardiology

## 2015-03-06 VITALS — BP 158/71 | HR 71 | Temp 97.9°F | Resp 18 | Wt 188.4 lb

## 2015-03-06 DIAGNOSIS — C8590 Non-Hodgkin lymphoma, unspecified, unspecified site: Secondary | ICD-10-CM | POA: Insufficient documentation

## 2015-03-06 DIAGNOSIS — C61 Malignant neoplasm of prostate: Secondary | ICD-10-CM

## 2015-03-06 DIAGNOSIS — C7951 Secondary malignant neoplasm of bone: Secondary | ICD-10-CM | POA: Diagnosis not present

## 2015-03-06 LAB — CBC WITH DIFFERENTIAL/PLATELET
Basophils Absolute: 0.1 10*3/uL (ref 0.0–0.1)
Basophils Relative: 1 %
Eosinophils Absolute: 0.1 10*3/uL (ref 0.0–0.7)
Eosinophils Relative: 1 %
HCT: 42.6 % (ref 39.0–52.0)
Hemoglobin: 14.9 g/dL (ref 13.0–17.0)
Lymphocytes Relative: 32 %
Lymphs Abs: 3.4 10*3/uL (ref 0.7–4.0)
MCH: 31.4 pg (ref 26.0–34.0)
MCHC: 35 g/dL (ref 30.0–36.0)
MCV: 89.7 fL (ref 78.0–100.0)
Monocytes Absolute: 0.8 10*3/uL (ref 0.1–1.0)
Monocytes Relative: 8 %
Neutro Abs: 6.2 10*3/uL (ref 1.7–7.7)
Neutrophils Relative %: 59 %
Platelets: 168 10*3/uL (ref 150–400)
RBC: 4.75 MIL/uL (ref 4.22–5.81)
RDW: 13.5 % (ref 11.5–15.5)
WBC: 10.5 10*3/uL (ref 4.0–10.5)

## 2015-03-06 LAB — COMPREHENSIVE METABOLIC PANEL
ALT: 21 U/L (ref 17–63)
AST: 21 U/L (ref 15–41)
Albumin: 3.6 g/dL (ref 3.5–5.0)
Alkaline Phosphatase: 90 U/L (ref 38–126)
Anion gap: 9 (ref 5–15)
BUN: 22 mg/dL — ABNORMAL HIGH (ref 6–20)
CO2: 28 mmol/L (ref 22–32)
Calcium: 9.5 mg/dL (ref 8.9–10.3)
Chloride: 99 mmol/L — ABNORMAL LOW (ref 101–111)
Creatinine, Ser: 0.8 mg/dL (ref 0.61–1.24)
GFR calc Af Amer: >60 mL/min (ref >60)
GFR calc non Af Amer: >60 mL/min (ref >60)
Glucose, Bld: 245 mg/dL — ABNORMAL HIGH (ref 65–99)
Potassium: 4.4 mmol/L (ref 3.5–5.1)
Sodium: 136 mmol/L (ref 135–145)
Total Bilirubin: 1 mg/dL (ref 0.3–1.2)
Total Protein: 6.8 g/dL (ref 6.5–8.1)

## 2015-03-06 LAB — PSA: PSA: 0.27 ng/mL (ref 0.00–4.00)

## 2015-03-06 MED ORDER — DENOSUMAB 120 MG/1.7ML ~~LOC~~ SOLN
120.0000 mg | Freq: Once | SUBCUTANEOUS | Status: AC
  Administered 2015-03-06: 120 mg via SUBCUTANEOUS
  Filled 2015-03-06: qty 1.7

## 2015-03-06 NOTE — Patient Instructions (Signed)
[place] at [facility] Discharge Instructions  RECOMMENDATIONS MADE BY THE CONSULTANT AND ANY TEST RESULTS WILL BE SENT TO YOUR REFERRING PHYSICIAN.  Labs reviewed with Meriel Flavors. Per his instructions you are slightly dehydrated and you need to increase your intake of water/fluids. Xgeva injection today as ordered. Labs and xgeva every 4 weeks. Per Gershon Mussel you may take an over-the-counter cough medicine (Delsym) if needed. You may discuss the issues with coughing when you see Dr.Penland 03/13/15. Return as scheduled.  Thank you for choosing [place] at [facility] to provide your oncology and hematology care.  To afford each patient quality time with our provider, please arrive at least 15 minutes before your scheduled appointment time.    You need to re-schedule your appointment should you arrive 10 or more minutes late.  We strive to give you quality time with our providers, and arriving late affects you and other patients whose appointments are after yours.  Also, if you no show three or more times for appointments you may be dismissed from the clinic at the providers discretion.     Again, thank you for choosing [facility].  Our hope is that these requests will decrease the amount of time that you wait before being seen by our physicians.       _____________________________________________________________  Should you have questions after your visit to [facility], please contact our office at [phone] between the hours of 8:30 a.m. and 4:30 p.m.  Voicemails left after 4:30 p.m. will not be returned until the following business day.  For prescription refill requests, have your pharmacy contact our office.

## 2015-03-06 NOTE — Progress Notes (Signed)
[name] presents today for injection per MD orders. Xgeva 120 mg  administered SQ in left Abdomen. Administration without incident. Patient tolerated well.    

## 2015-03-06 NOTE — Telephone Encounter (Signed)
Patient would Lisinopril/Hydrochlorathiazide sent to [facility] in [place] for 90 day supply / tg

## 2015-03-07 MED ORDER — LISINOPRIL-HYDROCHLOROTHIAZIDE 20-25 MG PO TABS: 1.0000 | ORAL_TABLET | Freq: Every day | ORAL | Status: DC

## 2015-03-07 NOTE — Progress Notes (Signed)
LABS DRAWN

## 2015-03-13 ENCOUNTER — Ambulatory Visit (HOSPITAL_COMMUNITY): Payer: Medicare Other | Admitting: Hematology & Oncology

## 2015-03-13 ENCOUNTER — Encounter (HOSPITAL_BASED_OUTPATIENT_CLINIC_OR_DEPARTMENT_OTHER): Payer: Medicare Other

## 2015-03-13 VITALS — BP 127/73 | HR 102 | Temp 98.2°F | Resp 18

## 2015-03-13 DIAGNOSIS — Z5111 Encounter for antineoplastic chemotherapy: Secondary | ICD-10-CM

## 2015-03-13 DIAGNOSIS — C61 Malignant neoplasm of prostate: Secondary | ICD-10-CM

## 2015-03-13 DIAGNOSIS — Z23 Encounter for immunization: Secondary | ICD-10-CM | POA: Diagnosis not present

## 2015-03-13 DIAGNOSIS — E1165 Type 2 diabetes mellitus with hyperglycemia: Secondary | ICD-10-CM | POA: Diagnosis not present

## 2015-03-13 DIAGNOSIS — I4891 Unspecified atrial fibrillation: Secondary | ICD-10-CM | POA: Diagnosis not present

## 2015-03-13 DIAGNOSIS — I10 Essential (primary) hypertension: Secondary | ICD-10-CM | POA: Diagnosis not present

## 2015-03-13 MED ORDER — LEUPROLIDE ACETATE (3 MONTH) 22.5 MG IM KIT
45.0000 mg | PACK | Freq: Once | INTRAMUSCULAR | Status: AC
  Administered 2015-03-13: 45 mg via INTRAMUSCULAR
  Filled 2015-03-13: qty 45

## 2015-03-13 NOTE — Patient Instructions (Signed)
[place] at [facility] Discharge Instructions  RECOMMENDATIONS MADE BY THE CONSULTANT AND ANY TEST RESULTS WILL BE SENT TO YOUR REFERRING PHYSICIAN.  Lupron 45 mg injection given today as ordered. Continue Lupron injection every 3 months. Missed MD appointment this am, please reschedule. Return as scheduled.  Thank you for choosing [place] at [facility] to provide your oncology and hematology care.  To afford each patient quality time with our provider, please arrive at least 15 minutes before your scheduled appointment time.    You need to re-schedule your appointment should you arrive 10 or more minutes late.  We strive to give you quality time with our providers, and arriving late affects you and other patients whose appointments are after yours.  Also, if you no show three or more times for appointments you may be dismissed from the clinic at the providers discretion.     Again, thank you for choosing [facility].  Our hope is that these requests will decrease the amount of time that you wait before being seen by our physicians.       _____________________________________________________________  Should you have questions after your visit to  [facility], please contact our office at [phone] between the hours of 8:30 a.m. and 4:30 p.m.  Voicemails left after 4:30 p.m. will not be returned until the following business day.  For prescription refill requests, have your pharmacy contact our office.

## 2015-03-13 NOTE — Progress Notes (Signed)
[name] presents today for injection per MD orders. Lupron injection 22.5 mg x 2 doses administered IM in both left and right upper outer Gluteals for total dose 45 mg. Administration without incident. Patient tolerated well. Patient states he was unaware of MD appointment this am. Will send patient to scheduler for appointment reschedule.

## 2015-03-22 DIAGNOSIS — K121 Other forms of stomatitis: Secondary | ICD-10-CM | POA: Diagnosis not present

## 2015-03-22 DIAGNOSIS — E1165 Type 2 diabetes mellitus with hyperglycemia: Secondary | ICD-10-CM | POA: Diagnosis not present

## 2015-03-25 ENCOUNTER — Other Ambulatory Visit: Payer: Self-pay | Admitting: *Deleted

## 2015-03-25 MED ORDER — WARFARIN SODIUM 2.5 MG PO TABS: ORAL_TABLET | ORAL | Status: DC

## 2015-04-01 NOTE — Progress Notes (Signed)
[name],[name], [place] / [place] [address]   DIAGNOSIS: NHL (non-Hodgkin's lymphoma)   Staging form: Lymphoid Neoplasms, AJCC 6th Edition     Clinical: Stage III - Signed by Baird Cancer, PA on 08/18/2011  1. 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 Significant vasomotor instability on full dose Casodex.  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 Margot Chimes in October 2005  #3. Diffuse large B-cell lymphoma clinically stage IIIAS STATUS post cervical lymph node biopsy on 08/03/2003. His lymphoma CD20 positive and PET scan showed positivity in the spleen. Bone marrow aspiration and biopsy were negative however. He received R. CHOP x6 cycles with a complete remission established by PET scan criteria on 11/23/2003. He completed 6 full cycles total. He remains without disease recurrence. He remains free of B. symptomatology.  #4 Zytiga Therapy secondary to development of castration resistant prostate cancer (01/2015) , XGEVA   CURRENT THERAPY: Lupron every 6 months, XGEVA, Zytiga  INTERVAL HISTORY: FREMON OHLMAN 81 y.o. male returns for follow up.He is doing well. He has no problems tolerating Zytiga.    Last PSA had fallen from 12 to 0.2. He is very pleased with this.   He and his wife will be traveling to [place] on January 1 through the end of March.  They do this trip yearly. One of his daughters is here with him today.   MEDICAL HISTORY: Past Medical History  Diagnosis Date  . Hypertension   . Atrial fibrillation ([place])     Dr. Harl Bowie- [facility] follows saw 11'14  . GIST (gastrointestinal stromal tumor), malignant ([place]) 08/18/2011    Gastrointestinal stromal tumor, that is GIST, small bowel, 4.5 cm, intermediate prognostic grade found on the PET scan in the small bowel, accounting for that small bowel  activity in October 2005 with resection by Dr. Margot Chimes, thus far without recurrence.   . NHL (non-Hodgkin's lymphoma) ([place]) 08/18/2011    Diffuse large B-cell lymphoma, clinically stage IIIA, CD20 positive, status post cervical lymph node biopsy 08/03/2003 on the left. PET scan was also positive in the spleen and small bowel region, but bone marrow aspiration and biopsy were negative. So he essentially had stage IIIAs. He received R-CHOP x6 cycles with CR established by PET scan criteria on 11/23/2003 with no evidence for relapse th  . Cataracts, both eyes     surgery planned May 2015  . Bladder stones     tx. with oral meds and antibiotics, now surgery planned  . Diabetes mellitus without complication (HCC)     has HYPERTENSION, UNSPECIFIED; ATRIAL FIBRILLATION; Long term (current) use of anticoagulants; NHL (non-Hodgkin's lymphoma) ([place]); GIST (gastrointestinal stromal tumor), malignant ([place]); Encounter for therapeutic drug monitoring; Hypertrophy of prostate with urinary obstruction and other lower urinary tract symptoms (LUTS); and Prostate cancer ([place]) on his problem list.     has No Known Allergies.  Current Outpatient Prescriptions on File Prior to Visit  Medication Sig Dispense Refill  . abiraterone Acetate (ZYTIGA) 250 MG tablet Take 4 tablets (1,000 mg total) by mouth daily. Take on an empty stomach 1 hour before or 2 hours after a meal 120 tablet 6  . acetaminophen (TYLENOL) 500 MG tablet Take 500 mg by mouth every 6 (six) hours as needed for mild pain.     Marland Kitchen amitriptyline (ELAVIL) 25 MG tablet Take  25 mg by mouth at bedtime.    . Calcium Carb-Cholecalciferol 600-800 MG-UNIT TABS Take 1 capsule by mouth at bedtime.    . Cholecalciferol (VITAMIN D) 2000 UNITS CAPS Take 1 capsule by mouth daily.    . Cranberry 200 MG CAPS Take 4,200 mg by mouth every morning.     . diltiazem (DILACOR XR) 180 MG 24 hr capsule Take 1 capsule (180 mg total) by mouth every evening. 90 capsule 1  . Docusate  Calcium (STOOL SOFTENER PO) Take 1 tablet by mouth 2 (two) times daily.     Marland Kitchen glipiZIDE (GLUCOTROL) 5 MG tablet Take by mouth daily before breakfast.    . Leuprolide Acetate, 6 Month, (LUPRON DEPOT) 45 MG injection Inject 45 mg into the muscle every 6 (six) months.    Marland Kitchen lisinopril-hydrochlorothiazide (PRINZIDE,ZESTORETIC) 20-25 MG tablet Take 1 tablet by mouth daily. 90 tablet 0  . omeprazole (PRILOSEC) 20 MG capsule Take 20 mg by mouth daily.     . potassium chloride (K-DUR,KLOR-CON) 10 MEQ tablet Take 1 tablet (10 mEq total) by mouth daily. 90 tablet 3  . predniSONE (DELTASONE) 5 MG tablet Take 1 tablet (5 mg total) by mouth 2 (two) times daily with a meal. 60 tablet 6  . SENNA PO Take 1 capsule by mouth as needed.    . warfarin (COUMADIN) 2.5 MG tablet Take 2 tablets daily except 1 tablet on Mondays, Wednesdays and Fridays or as directed by Coumadin Clinic 60 tablet 3  . bicalutamide (CASODEX) 50 MG tablet Take 25 mg by mouth daily.    Marland Kitchen linagliptin (TRADJENTA) 5 MG TABS tablet Take 5 mg by mouth at bedtime.      No current facility-administered medications on file prior to visit.    SURGICAL HISTORY: Past Surgical History  Procedure Laterality Date  . Cholecystectomy    . Incision and drainage perirectal abscess    . Portacath placement      insertion and removal -last chemotherapy 10 yrs ago  . Lymph node dissection Left     '05  . Port-a-cath removal    . Transurethral resection of prostate N/A 08/10/2013    Procedure: TRANSURETHRAL RESECTION OF THE PROSTATE WITH GYRUS INSTRUMENTS;  Surgeon: [name], MD;  Location: [facility];  Service: Urology;  Laterality: N/A;  . Cystoscopy with litholapaxy N/A 08/10/2013    Procedure: CYSTOSCOPY WITH LITHOLAPAXY WITH [name];  Surgeon: [name], MD;  Location: [facility];  Service: Urology;  Laterality: N/A;  . Cataract extraction Right 08/2013    SOCIAL HISTORY: Social History   Social History  . Marital Status: Married     Spouse Name: N/A  . Number of Children: N/A  . Years of Education: N/A   Occupational History  . retired     Hotel manager   Social History Main Topics  . Smoking status: Never Smoker   . Smokeless tobacco: Never Used  . Alcohol Use: No  . Drug Use: No  . Sexual Activity: Not Currently   Other Topics Concern  . Not on file   Social History Narrative  He has 8 grandchildren. He takes care of his garden and mows his lawn. 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  FAMILY HISTORY: No family history on file.  Father died of pneumonia when the patient was 62 year of age Mother died of MI at 22 All of his siblings are deceased  Review of Systems  Constitutional: Negative for fever, chills, weight loss and malaise/fatigue, hot flashes.  HENT: Negative  for congestion, hearing loss, nosebleeds, sore throat and tinnitus.   Eyes: Negative for blurred vision, double vision, pain and discharge.  Respiratory: Negative for cough, hemoptysis, sputum production, shortness of breath and wheezing.   Cardiovascular: Negative for chest pain, palpitations, claudication, leg swelling and PND.  Gastrointestinal: Negative for heartburn, nausea, vomiting, abdominal pain, diarrhea, constipation, blood in stool and melena.  Genitourinary: Negative for dysuria, urgency, frequency and hematuria.  Musculoskeletal: Negative for myalgias, joint pain and falls.  Skin: Negative for itching and rash.  Neurological: Negative for dizziness, tingling, tremors, sensory change, speech change, focal weakness, seizures, loss of consciousness, weakness and headaches.  Endo/Heme/Allergies: Does not bruise/bleed easily.  Psychiatric/Behavioral: Negative for depression, suicidal ideas, memory loss and substance abuse. The patient is not nervous/anxious and does not have insomnia.    14 point review of systems was performed and is negative except as detailed under history of present illness and above   PHYSICAL EXAMINATION  ECOG PERFORMANCE  STATUS: 0 - Asymptomatic  Filed Vitals:   04/02/15 1047  BP: 156/83  Pulse: 52  Temp: 97.4 F (36.3 C)  Resp: 18    Physical Exam  Constitutional: He is oriented to person, place, and time and well-developed, well-nourished, and in no distress. Appears younger than stated age HENT:  Head: Normocephalic and atraumatic.  Nose: Nose normal.  Mouth/Throat: Oropharynx is clear and moist. No oropharyngeal exudate.  Eyes: Conjunctivae and EOM are normal. Pupils are equal, round, and reactive to light. Right eye exhibits no discharge. Left eye exhibits no discharge. No scleral icterus.  Neck: Normal range of motion. Neck supple. No tracheal deviation present. No thyromegaly present.  Cardiovascular: Normal rate, regular rhythm and normal heart sounds.  Exam reveals no gallop and no friction rub.   No murmur heard. Pulmonary/Chest: Effort normal and breath sounds normal. He has no wheezes. He has no rales.  Abdominal: Soft. Bowel sounds are normal. He exhibits no distension and no mass. There is no tenderness. There is no rebound and no guarding.  Musculoskeletal: Normal range of motion. He exhibits no edema.  Lymphadenopathy:    He has no cervical adenopathy.  Neurological: He is alert and oriented to person, place, and time. He has normal reflexes. No cranial nerve deficit. Gait normal. Coordination normal.  Skin: Skin is warm and dry. No rash noted.  Psychiatric: Mood, memory, affect and judgment normal.  Nursing note and vitals reviewed.  LABORATORY DATA: I have reviewed the labs below.  CBC    Component Value Date/Time   WBC 8.7 04/02/2015 1044   RBC 4.75 04/02/2015 1044   RBC 4.77 09/08/2013 1047   HGB 15.5 04/02/2015 1044   HCT 43.1 04/02/2015 1044   PLT 196 04/02/2015 1044   MCV 90.7 04/02/2015 1044   MCH 32.6 04/02/2015 1044   MCHC 36.0 04/02/2015 1044   RDW 14.2 04/02/2015 1044   LYMPHSABS 3.0 04/02/2015 1044   MONOABS 0.9 04/02/2015 1044   EOSABS 0.1 04/02/2015 1044    BASOSABS 0.0 04/02/2015 1044   CMP     Component Value Date/Time   NA 137 04/02/2015 1044   K 4.0 04/02/2015 1044   CL 98* 04/02/2015 1044   CO2 30 04/02/2015 1044   GLUCOSE 139* 04/02/2015 1044   BUN 17 04/02/2015 1044   CREATININE 0.83 04/02/2015 1044   CREATININE 1.14 07/27/2013 1004   CALCIUM 9.8 04/02/2015 1044   PROT 7.1 04/02/2015 1044   ALBUMIN 3.9 04/02/2015 1044   AST 18 04/02/2015 1044  ALT 22 04/02/2015 1044   ALKPHOS 72 04/02/2015 1044   BILITOT 1.0 04/02/2015 1044   GFRNONAA >60 04/02/2015 1044   GFRNONAA 58* 07/27/2013 1004   GFRAA >60 04/02/2015 1044   GFRAA 66 07/27/2013 1004    RADIOLOGY: I have personally reviewed the radiological images as listed and agreed with the findings in the report.  EXAM: DUAL X-RAY ABSORPTIOMETRY (DXA) FOR BONE MINERAL DENSITY  IMPRESSION: Ordering Physician: Dr. Patrici Ranks,  Your patient MAZIAR SALEY completed a BMD test on 09/19/2014 using the [place] (software version: 14.10) manufactured by [employer]. The following summarizes the results of our evaluation. PATIENT BIOGRAPHICAL: Name: JOHNMICHAEL, HELMAN Patient ID: [id number] Birth Date: 09/04/1923 Height: 70.5 in. Gender: Male Exam Date: 09/19/2014 Weight: 188.0 lbs. Indications: Caucasian, Height Loss, Low Calcium Intake, Prostate Cancer Fractures: Treatments: DENSITOMETRY RESULTS: Site Region Measured Date Measured Age WHO Classification Young Adult T-score BMD %Change vs. Previous Significant Change (*) DualFemur Neck Right 09/19/2014 88.6 N/A -0.7 0.975 g/cm2  Left Forearm Radius 33% 09/19/2014 88.6 N[age]A -0.5 0.767 g/cm2 ASSESSMENT: BMD as determined from Femur Neck Right is 0.975 g/cm2 with a T-Score of -0.7. This patient is considered normal by World Healh Organization [facility] Criteria. (Lumbar spine was not utilized due to advanced degenerative changes.) (Patient does not meet criteria  for FRAX assessment.)  World Health Organization [facility]) criteria for post-menopausal, Caucasian Women: Normal: T-score at or above -1 SD Osteopenia: T-score between -1 and -2.5 SD Osteoporosis: T-score at or below -2.5 SD  RECOMMENDATIONS: [place] recommends that FDA-approved medial therapies be considered in postmenopausal women and men age 88 or older with a: 1. Hip or vertberal (clinical or morphometric) fracture. 2. T-Score of < -2.5 at the spine or hip. 3. Ten-year fracture probability by FRAX of 3% or greater for hip fracture or 20% or greater for major osteoporotic fracture.  All treatment decisions require clinical judgment and consideration of indiviual patient factors, including patient preferences, co-morbidities, previous drug use, risk factors not captured in the FRAX model (e.g. falls, vitamin D deficiency, increased bone turnover, interval significant decline in bone density) and possible under-or over-estimation of fracture risk by FRAX.  All patients should ensure an adequate intake of dietary calcium (1200 mg/d) and vitamin D (800 IU daily) unless contraindicated.  FOLLOW-UP: People with diagnosed cases of osteoporosis or osteopenia should be regularly tested for bone mineral density. For patients eligible for Medicare, routine testing is allowed once every 2 years. Testing frequency can be increased for patients who have rapidly progressing disease, or for those who are receiving medical therapy to restore bone mass.  I have reviewed this report, and agree with the above findings.  [facility]   Electronically Signed  By: Rolm Baptise M.D.  On: 09/19/2014 09:42    CLINICAL DATA: Prostate cancer diagnosed in 2005 with recent bladder stones. Rising PSA. Diabetes. Hypertension. Lymphoma in 2013. Gastrointestinal stromal tumor in 2013.  EXAM: CT CHEST, ABDOMEN, AND PELVIS WITH  CONTRAST IMPRESSION: 1. Osseous metastasis. When correlated with today's bone scan, felt to be slightly progressive. 2. No evidence of extraosseous metastasis. 3. Esophageal air fluid level suggests dysmotility or gastroesophageal reflux. 4. Small bladder saccules, suggesting a component of outlet obstruction.   Electronically Signed  By: Abigail Miyamoto M.D.  On: 10/05/2014 16:13    ASSESSMENT and THERAPY PLAN:  Stage IV adenocarcinoma of the prostate Lupron therapy/Casodex Rising PSA with value of 3.11 ng/ml on 09/10/2014  DEXA with  normal bone density XGEVA monthly Casodex discontinued with continued rise of PSA Zytiga 01/2015   Andras is up to date on his Lupron through May. While he is in [place] I advised him that it is not unreasonable to miss 2 to 3 Xgeva doses. If he was going to be present there longer we could try to arrange for Oncology f/u locally.   He will continue on his Xtandi. We will have him go to a local lab in mid February for labs including a CBC, CMP and PSA. Results will be faxed here.  He will return in April and I will see him back that first week with repeat labs, XGEVA and a PE.  We will help make sure that his medication gets to him in [place].   All questions were answered. The patient knows to call the clinic with any problems, questions or concerns. We can certainly see the patient much sooner if necessary.   This note was electronically signed.  Kelby Fam. Penland MD

## 2015-04-02 ENCOUNTER — Ambulatory Visit (INDEPENDENT_AMBULATORY_CARE_PROVIDER_SITE_OTHER): Payer: Medicare Other | Admitting: Urology

## 2015-04-02 ENCOUNTER — Encounter (HOSPITAL_BASED_OUTPATIENT_CLINIC_OR_DEPARTMENT_OTHER): Payer: Medicare Other

## 2015-04-02 ENCOUNTER — Encounter (HOSPITAL_COMMUNITY): Payer: Medicare Other | Attending: Hematology & Oncology | Admitting: Hematology & Oncology

## 2015-04-02 ENCOUNTER — Encounter (HOSPITAL_COMMUNITY): Payer: Self-pay | Admitting: Hematology & Oncology

## 2015-04-02 VITALS — BP 156/83 | HR 52 | Temp 97.4°F | Resp 18 | Wt 191.6 lb

## 2015-04-02 DIAGNOSIS — C61 Malignant neoplasm of prostate: Secondary | ICD-10-CM | POA: Diagnosis not present

## 2015-04-02 DIAGNOSIS — C7951 Secondary malignant neoplasm of bone: Secondary | ICD-10-CM | POA: Diagnosis not present

## 2015-04-02 DIAGNOSIS — C8590 Non-Hodgkin lymphoma, unspecified, unspecified site: Secondary | ICD-10-CM | POA: Diagnosis not present

## 2015-04-02 DIAGNOSIS — N390 Urinary tract infection, site not specified: Secondary | ICD-10-CM | POA: Diagnosis not present

## 2015-04-02 DIAGNOSIS — R3915 Urgency of urination: Secondary | ICD-10-CM | POA: Diagnosis not present

## 2015-04-02 LAB — COMPREHENSIVE METABOLIC PANEL
ALT: 22 U/L (ref 17–63)
AST: 18 U/L (ref 15–41)
Albumin: 3.9 g/dL (ref 3.5–5.0)
Alkaline Phosphatase: 72 U/L (ref 38–126)
Anion gap: 9 (ref 5–15)
BUN: 17 mg/dL (ref 6–20)
CO2: 30 mmol/L (ref 22–32)
Calcium: 9.8 mg/dL (ref 8.9–10.3)
Chloride: 98 mmol/L — ABNORMAL LOW (ref 101–111)
Creatinine, Ser: 0.83 mg/dL (ref 0.61–1.24)
GFR calc Af Amer: >60 mL/min (ref >60)
GFR calc non Af Amer: >60 mL/min (ref >60)
Glucose, Bld: 139 mg/dL — ABNORMAL HIGH (ref 65–99)
Potassium: 4 mmol/L (ref 3.5–5.1)
Sodium: 137 mmol/L (ref 135–145)
Total Bilirubin: 1 mg/dL (ref 0.3–1.2)
Total Protein: 7.1 g/dL (ref 6.5–8.1)

## 2015-04-02 LAB — CBC WITH DIFFERENTIAL/PLATELET
Basophils Absolute: 0 10*3/uL (ref 0.0–0.1)
Basophils Relative: 0 %
Eosinophils Absolute: 0.1 10*3/uL (ref 0.0–0.7)
Eosinophils Relative: 1 %
HCT: 43.1 % (ref 39.0–52.0)
Hemoglobin: 15.5 g/dL (ref 13.0–17.0)
Lymphocytes Relative: 35 %
Lymphs Abs: 3 10*3/uL (ref 0.7–4.0)
MCH: 32.6 pg (ref 26.0–34.0)
MCHC: 36 g/dL (ref 30.0–36.0)
MCV: 90.7 fL (ref 78.0–100.0)
Monocytes Absolute: 0.9 10*3/uL (ref 0.1–1.0)
Monocytes Relative: 10 %
Neutro Abs: 4.8 10*3/uL (ref 1.7–7.7)
Neutrophils Relative %: 54 %
Platelets: 196 10*3/uL (ref 150–400)
RBC: 4.75 MIL/uL (ref 4.22–5.81)
RDW: 14.2 % (ref 11.5–15.5)
WBC: 8.7 10*3/uL (ref 4.0–10.5)

## 2015-04-02 LAB — PSA: PSA: 0.03 ng/mL (ref 0.00–4.00)

## 2015-04-02 NOTE — Progress Notes (Signed)
LABS DRAWN

## 2015-04-02 NOTE — Patient Instructions (Addendum)
[place] at [facility] Discharge Instructions  RECOMMENDATIONS MADE BY THE CONSULTANT AND ANY TEST RESULTS WILL BE SENT TO YOUR REFERRING PHYSICIAN.   Exam completed by Dr Whitney Muse today Jayme Cloud tomorrow You will return in April and we will see you and get your back on schedule We will get haley to check about your medication for your trip Make sure you are taking your Calcium with Vitamin D Prescription given for lab work in [place]. Return to see the doctor 1st week in April You will get x-geva then  Lupron every 6 months Continue taking zytiga as prescribed Please call the clinic if you have any questions or concerns    Thank you for choosing [place] at [facility] to provide your oncology and hematology care.  To afford each patient quality time with our provider, please arrive at least 15 minutes before your scheduled appointment time.    You need to re-schedule your appointment should you arrive 10 or more minutes late.  We strive to give you quality time with our providers, and arriving late affects you and other patients whose appointments are after yours.  Also, if you no show three or more times for appointments you may be dismissed from the clinic at the providers discretion.     Again, thank you for choosing [facility].  Our hope is that these requests will decrease the amount of time that you wait before being seen by our physicians.       _____________________________________________________________  Should you have questions after your visit to [facility], please contact our office at [phone] between the hours of 8:30 a.m. and 4:30 p.m.  Voicemails left after 4:30 p.m. will not be returned until the following business day.  For prescription refill requests, have your pharmacy contact our office.

## 2015-04-03 ENCOUNTER — Ambulatory Visit (INDEPENDENT_AMBULATORY_CARE_PROVIDER_SITE_OTHER): Payer: Medicare Other | Admitting: *Deleted

## 2015-04-03 ENCOUNTER — Encounter (HOSPITAL_BASED_OUTPATIENT_CLINIC_OR_DEPARTMENT_OTHER): Payer: Medicare Other

## 2015-04-03 ENCOUNTER — Encounter (HOSPITAL_COMMUNITY): Payer: Self-pay

## 2015-04-03 VITALS — BP 137/77 | HR 68 | Temp 97.6°F | Resp 16

## 2015-04-03 DIAGNOSIS — C7951 Secondary malignant neoplasm of bone: Secondary | ICD-10-CM | POA: Diagnosis not present

## 2015-04-03 DIAGNOSIS — C61 Malignant neoplasm of prostate: Secondary | ICD-10-CM

## 2015-04-03 DIAGNOSIS — I4891 Unspecified atrial fibrillation: Secondary | ICD-10-CM

## 2015-04-03 DIAGNOSIS — Z5181 Encounter for therapeutic drug level monitoring: Secondary | ICD-10-CM

## 2015-04-03 DIAGNOSIS — Z7901 Long term (current) use of anticoagulants: Secondary | ICD-10-CM | POA: Diagnosis not present

## 2015-04-03 LAB — POCT INR: INR: 1.7

## 2015-04-03 MED ORDER — DENOSUMAB 120 MG/1.7ML ~~LOC~~ SOLN
120.0000 mg | Freq: Once | SUBCUTANEOUS | Status: AC
Start: 2015-04-03 — End: 2015-04-03
  Administered 2015-04-03: 120 mg via SUBCUTANEOUS
  Filled 2015-04-03: qty 1.7

## 2015-04-03 NOTE — Patient Instructions (Signed)
[place] at [facility] Discharge Instructions  RECOMMENDATIONS MADE BY THE CONSULTANT AND ANY TEST RESULTS WILL BE SENT TO YOUR REFERRING PHYSICIAN.  Xgeva injection today. Return as scheduled for lab work and office visit.   Thank you for choosing [place] at [facility] to provide your oncology and hematology care.  To afford each patient quality time with our provider, please arrive at least 15 minutes before your scheduled appointment time.    You need to re-schedule your appointment should you arrive 10 or more minutes late.  We strive to give you quality time with our providers, and arriving late affects you and other patients whose appointments are after yours.  Also, if you no show three or more times for appointments you may be dismissed from the clinic at the providers discretion.     Again, thank you for choosing [facility].  Our hope is that these requests will decrease the amount of time that you wait before being seen by our physicians.       _____________________________________________________________  Should you have questions after your visit to [facility], please contact our office at [phone] between the hours of 8:30 a.m. and 4:30 p.m.  Voicemails left after 4:30 p.m. will not be returned until the following business day.  For prescription refill requests, have your pharmacy contact our office.

## 2015-04-03 NOTE — Progress Notes (Signed)
[name] presents today for injection per the provider's orders.  Xgeva administration without incident; see MAR for injection details.  Patient tolerated procedure well and without incident.  No questions or complaints noted at this time.

## 2015-04-05 NOTE — Progress Notes (Signed)
This encounter was created in error - please disregard.  This encounter was created in error - please disregard.

## 2015-04-09 DIAGNOSIS — B351 Tinea unguium: Secondary | ICD-10-CM | POA: Diagnosis not present

## 2015-04-09 DIAGNOSIS — E1142 Type 2 diabetes mellitus with diabetic polyneuropathy: Secondary | ICD-10-CM | POA: Diagnosis not present

## 2015-04-17 ENCOUNTER — Other Ambulatory Visit (HOSPITAL_COMMUNITY): Payer: Self-pay | Admitting: Oncology

## 2015-04-17 DIAGNOSIS — C61 Malignant neoplasm of prostate: Secondary | ICD-10-CM

## 2015-04-17 MED ORDER — PREDNISONE 5 MG PO TABS: 5.0000 mg | ORAL_TABLET | Freq: Two times a day (BID) | ORAL | Status: DC

## 2015-04-21 DIAGNOSIS — H35359 Cystoid macular degeneration, unspecified eye: Secondary | ICD-10-CM

## 2015-04-21 HISTORY — DX: Cystoid macular degeneration, unspecified eye: H35.359

## 2015-04-23 DIAGNOSIS — I4891 Unspecified atrial fibrillation: Secondary | ICD-10-CM | POA: Diagnosis not present

## 2015-04-23 DIAGNOSIS — Z5181 Encounter for therapeutic drug level monitoring: Secondary | ICD-10-CM | POA: Diagnosis not present

## 2015-04-23 DIAGNOSIS — Z7901 Long term (current) use of anticoagulants: Secondary | ICD-10-CM | POA: Diagnosis not present

## 2015-04-23 LAB — PROTIME-INR: INR: 1.9 — AB (ref 0.9–1.1)

## 2015-04-24 ENCOUNTER — Ambulatory Visit (INDEPENDENT_AMBULATORY_CARE_PROVIDER_SITE_OTHER): Payer: Medicare Other | Admitting: *Deleted

## 2015-04-24 DIAGNOSIS — I4891 Unspecified atrial fibrillation: Secondary | ICD-10-CM

## 2015-04-24 DIAGNOSIS — Z5181 Encounter for therapeutic drug level monitoring: Secondary | ICD-10-CM

## 2015-04-26 DIAGNOSIS — E119 Type 2 diabetes mellitus without complications: Secondary | ICD-10-CM | POA: Diagnosis not present

## 2015-04-26 DIAGNOSIS — B353 Tinea pedis: Secondary | ICD-10-CM | POA: Diagnosis not present

## 2015-04-26 DIAGNOSIS — B354 Tinea corporis: Secondary | ICD-10-CM | POA: Diagnosis not present

## 2015-04-29 DIAGNOSIS — E119 Type 2 diabetes mellitus without complications: Secondary | ICD-10-CM | POA: Diagnosis not present

## 2015-04-29 DIAGNOSIS — B354 Tinea corporis: Secondary | ICD-10-CM | POA: Diagnosis not present

## 2015-04-29 DIAGNOSIS — I739 Peripheral vascular disease, unspecified: Secondary | ICD-10-CM | POA: Diagnosis not present

## 2015-04-29 DIAGNOSIS — B353 Tinea pedis: Secondary | ICD-10-CM | POA: Diagnosis not present

## 2015-04-29 DIAGNOSIS — B351 Tinea unguium: Secondary | ICD-10-CM | POA: Diagnosis not present

## 2015-05-01 ENCOUNTER — Telehealth (HOSPITAL_COMMUNITY): Payer: Self-pay | Admitting: Hematology & Oncology

## 2015-05-08 DIAGNOSIS — M25551 Pain in right hip: Secondary | ICD-10-CM | POA: Diagnosis not present

## 2015-05-10 ENCOUNTER — Telehealth (HOSPITAL_COMMUNITY): Payer: Self-pay | Admitting: Hematology & Oncology

## 2015-05-10 NOTE — Telephone Encounter (Signed)
Called.

## 2015-05-10 NOTE — Telephone Encounter (Signed)
pc to [facility] to advise her that the pt was approved for copay assist thru [employer] for Zytiga. 05/10/15-04/19/16

## 2015-05-14 DIAGNOSIS — Z7901 Long term (current) use of anticoagulants: Secondary | ICD-10-CM | POA: Diagnosis not present

## 2015-05-14 DIAGNOSIS — Z5181 Encounter for therapeutic drug level monitoring: Secondary | ICD-10-CM | POA: Diagnosis not present

## 2015-05-14 DIAGNOSIS — I4891 Unspecified atrial fibrillation: Secondary | ICD-10-CM | POA: Diagnosis not present

## 2015-05-14 LAB — PROTIME-INR: INR: 3.4 — AB (ref 0.9–1.1)

## 2015-05-15 ENCOUNTER — Ambulatory Visit (INDEPENDENT_AMBULATORY_CARE_PROVIDER_SITE_OTHER): Payer: Medicare Other | Admitting: *Deleted

## 2015-05-15 DIAGNOSIS — Z5181 Encounter for therapeutic drug level monitoring: Secondary | ICD-10-CM

## 2015-05-15 DIAGNOSIS — I4891 Unspecified atrial fibrillation: Secondary | ICD-10-CM

## 2015-05-27 ENCOUNTER — Telehealth (HOSPITAL_COMMUNITY): Payer: Self-pay | Admitting: *Deleted

## 2015-05-27 NOTE — Telephone Encounter (Signed)
Message left for them to call

## 2015-05-27 NOTE — Telephone Encounter (Signed)
Zytiga can also elevate blood sugar. Needs to check his blood sugars tid. Keep [place] advised. Dr.P

## 2015-05-27 NOTE — Telephone Encounter (Signed)
Nyko's wife called from [place] to report that he has normally ran 150-160 blood sugars and here recently he has ran 200. She questions if this can be a side effect of the zytiga or prednisone. I have explained to her that the prednisone can increase glucose and that I will ask Dr. Whitney Muse about zytiga. I also discussed paying close attention to his diet and cutting back on bread and sweets.

## 2015-05-27 NOTE — Telephone Encounter (Signed)
Patient's wife called to report that they have mailed his zytiga prescription.

## 2015-05-30 NOTE — Telephone Encounter (Signed)
Spoke with his wife notifed her that zytiga can also cause increase in blood sugar.  To check his blood sugar  TID.  Blood work on 2/14. She will keep [place] informed

## 2015-06-03 ENCOUNTER — Encounter (HOSPITAL_COMMUNITY): Payer: Self-pay | Admitting: *Deleted

## 2015-06-03 DIAGNOSIS — Z794 Long term (current) use of insulin: Principal | ICD-10-CM

## 2015-06-03 DIAGNOSIS — E1165 Type 2 diabetes mellitus with hyperglycemia: Secondary | ICD-10-CM

## 2015-06-03 MED ORDER — GLUCOSE BLOOD VI STRP: ORAL_STRIP | Status: DC

## 2015-06-03 MED ORDER — ONETOUCH ULTRASOFT LANCETS MISC: Status: DC

## 2015-06-04 DIAGNOSIS — Z5181 Encounter for therapeutic drug level monitoring: Secondary | ICD-10-CM | POA: Diagnosis not present

## 2015-06-04 DIAGNOSIS — C61 Malignant neoplasm of prostate: Secondary | ICD-10-CM | POA: Diagnosis not present

## 2015-06-04 DIAGNOSIS — I4891 Unspecified atrial fibrillation: Secondary | ICD-10-CM | POA: Diagnosis not present

## 2015-06-04 DIAGNOSIS — Z79899 Other long term (current) drug therapy: Secondary | ICD-10-CM | POA: Diagnosis not present

## 2015-06-04 DIAGNOSIS — Z7901 Long term (current) use of anticoagulants: Secondary | ICD-10-CM | POA: Diagnosis not present

## 2015-06-04 LAB — PROTIME-INR: INR: 2.3 — AB (ref 0.9–1.1)

## 2015-06-11 ENCOUNTER — Telehealth: Payer: Self-pay | Admitting: *Deleted

## 2015-06-11 ENCOUNTER — Ambulatory Visit (INDEPENDENT_AMBULATORY_CARE_PROVIDER_SITE_OTHER): Payer: Medicare Other | Admitting: *Deleted

## 2015-06-11 DIAGNOSIS — I4891 Unspecified atrial fibrillation: Secondary | ICD-10-CM

## 2015-06-11 DIAGNOSIS — Z5181 Encounter for therapeutic drug level monitoring: Secondary | ICD-10-CM

## 2015-06-11 NOTE — Telephone Encounter (Signed)
LMOM for pt with coumadin instructions.  See coumadin note.

## 2015-06-11 NOTE — Telephone Encounter (Signed)
Message left from the patient's family stating that he had blood work done in [place] last Tuesday but they haven't been given any dose changes for his Coumadin.  They requested a call back at [phone] to discuss.

## 2015-06-12 ENCOUNTER — Ambulatory Visit (HOSPITAL_COMMUNITY): Payer: Medicare Other

## 2015-06-13 ENCOUNTER — Telehealth (HOSPITAL_COMMUNITY): Payer: Self-pay | Admitting: *Deleted

## 2015-06-18 DIAGNOSIS — B353 Tinea pedis: Secondary | ICD-10-CM | POA: Diagnosis not present

## 2015-06-18 DIAGNOSIS — C61 Malignant neoplasm of prostate: Secondary | ICD-10-CM | POA: Diagnosis not present

## 2015-06-18 DIAGNOSIS — B354 Tinea corporis: Secondary | ICD-10-CM | POA: Diagnosis not present

## 2015-06-18 DIAGNOSIS — M7601 Gluteal tendinitis, right hip: Secondary | ICD-10-CM | POA: Diagnosis not present

## 2015-07-01 DIAGNOSIS — B351 Tinea unguium: Secondary | ICD-10-CM | POA: Diagnosis not present

## 2015-07-05 DIAGNOSIS — E1165 Type 2 diabetes mellitus with hyperglycemia: Secondary | ICD-10-CM | POA: Diagnosis not present

## 2015-07-05 DIAGNOSIS — N390 Urinary tract infection, site not specified: Secondary | ICD-10-CM | POA: Diagnosis not present

## 2015-07-05 DIAGNOSIS — R81 Glycosuria: Secondary | ICD-10-CM | POA: Diagnosis not present

## 2015-07-05 DIAGNOSIS — R509 Fever, unspecified: Secondary | ICD-10-CM | POA: Diagnosis not present

## 2015-07-24 ENCOUNTER — Encounter (HOSPITAL_COMMUNITY): Payer: Medicare Other

## 2015-07-24 ENCOUNTER — Ambulatory Visit (INDEPENDENT_AMBULATORY_CARE_PROVIDER_SITE_OTHER): Payer: Medicare Other | Admitting: *Deleted

## 2015-07-24 ENCOUNTER — Encounter (HOSPITAL_BASED_OUTPATIENT_CLINIC_OR_DEPARTMENT_OTHER): Payer: Medicare Other

## 2015-07-24 ENCOUNTER — Other Ambulatory Visit (HOSPITAL_COMMUNITY): Payer: Self-pay | Admitting: Oncology

## 2015-07-24 ENCOUNTER — Encounter (HOSPITAL_COMMUNITY): Payer: Self-pay | Admitting: Hematology & Oncology

## 2015-07-24 ENCOUNTER — Encounter (HOSPITAL_COMMUNITY): Payer: Medicare Other | Attending: Hematology & Oncology | Admitting: Hematology & Oncology

## 2015-07-24 VITALS — BP 118/70 | HR 80 | Temp 97.7°F | Resp 16 | Ht 70.0 in | Wt 185.4 lb

## 2015-07-24 DIAGNOSIS — C61 Malignant neoplasm of prostate: Secondary | ICD-10-CM

## 2015-07-24 DIAGNOSIS — R972 Elevated prostate specific antigen [PSA]: Secondary | ICD-10-CM | POA: Diagnosis not present

## 2015-07-24 DIAGNOSIS — M79606 Pain in leg, unspecified: Secondary | ICD-10-CM | POA: Diagnosis not present

## 2015-07-24 DIAGNOSIS — Z5181 Encounter for therapeutic drug level monitoring: Secondary | ICD-10-CM

## 2015-07-24 DIAGNOSIS — M1990 Unspecified osteoarthritis, unspecified site: Secondary | ICD-10-CM | POA: Insufficient documentation

## 2015-07-24 DIAGNOSIS — M25551 Pain in right hip: Secondary | ICD-10-CM | POA: Insufficient documentation

## 2015-07-24 DIAGNOSIS — Z7901 Long term (current) use of anticoagulants: Secondary | ICD-10-CM | POA: Diagnosis not present

## 2015-07-24 DIAGNOSIS — M545 Low back pain: Secondary | ICD-10-CM | POA: Insufficient documentation

## 2015-07-24 DIAGNOSIS — C7951 Secondary malignant neoplasm of bone: Secondary | ICD-10-CM | POA: Diagnosis not present

## 2015-07-24 DIAGNOSIS — I4891 Unspecified atrial fibrillation: Secondary | ICD-10-CM | POA: Diagnosis not present

## 2015-07-24 DIAGNOSIS — Z79899 Other long term (current) drug therapy: Secondary | ICD-10-CM

## 2015-07-24 LAB — COMPREHENSIVE METABOLIC PANEL
ALT: 18 U/L (ref 17–63)
AST: 18 U/L (ref 15–41)
Albumin: 4 g/dL (ref 3.5–5.0)
Alkaline Phosphatase: 62 U/L (ref 38–126)
Anion gap: 11 (ref 5–15)
BUN: 20 mg/dL (ref 6–20)
CO2: 29 mmol/L (ref 22–32)
Calcium: 9.4 mg/dL (ref 8.9–10.3)
Chloride: 100 mmol/L — ABNORMAL LOW (ref 101–111)
Creatinine, Ser: 0.83 mg/dL (ref 0.61–1.24)
GFR calc Af Amer: >60 mL/min (ref >60)
GFR calc non Af Amer: >60 mL/min (ref >60)
Glucose, Bld: 148 mg/dL — ABNORMAL HIGH (ref 65–99)
Potassium: 3.5 mmol/L (ref 3.5–5.1)
Sodium: 140 mmol/L (ref 135–145)
Total Bilirubin: 1.1 mg/dL (ref 0.3–1.2)
Total Protein: 6.7 g/dL (ref 6.5–8.1)

## 2015-07-24 LAB — CBC WITH DIFFERENTIAL/PLATELET
Basophils Absolute: 0 10*3/uL (ref 0.0–0.1)
Basophils Relative: 0 %
Eosinophils Absolute: 0 10*3/uL (ref 0.0–0.7)
Eosinophils Relative: 0 %
HCT: 41 % (ref 39.0–52.0)
Hemoglobin: 14.6 g/dL (ref 13.0–17.0)
Lymphocytes Relative: 39 %
Lymphs Abs: 3.5 10*3/uL (ref 0.7–4.0)
MCH: 33 pg (ref 26.0–34.0)
MCHC: 35.6 g/dL (ref 30.0–36.0)
MCV: 92.8 fL (ref 78.0–100.0)
Monocytes Absolute: 0.8 10*3/uL (ref 0.1–1.0)
Monocytes Relative: 9 %
Neutro Abs: 4.6 10*3/uL (ref 1.7–7.7)
Neutrophils Relative %: 52 %
Platelets: 249 10*3/uL (ref 150–400)
RBC: 4.42 MIL/uL (ref 4.22–5.81)
RDW: 13.1 % (ref 11.5–15.5)
WBC: 8.9 10*3/uL (ref 4.0–10.5)

## 2015-07-24 LAB — POCT INR: INR: 2.4

## 2015-07-24 LAB — PSA: PSA: <0.01 ng/mL (ref 0.00–4.00)

## 2015-07-24 MED ORDER — ABIRATERONE ACETATE 250 MG PO TABS: 1000.0000 mg | ORAL_TABLET | Freq: Every day | ORAL | Status: DC

## 2015-07-24 MED ORDER — DENOSUMAB 120 MG/1.7ML ~~LOC~~ SOLN
120.0000 mg | Freq: Once | SUBCUTANEOUS | Status: AC
  Administered 2015-07-24: 120 mg via SUBCUTANEOUS
  Filled 2015-07-24: qty 1.7

## 2015-07-24 NOTE — Patient Instructions (Signed)
[place] at [facility] Discharge Instructions  RECOMMENDATIONS MADE BY THE CONSULTANT AND ANY TEST RESULTS WILL BE SENT TO YOUR REFERRING PHYSICIAN.   Exam and discussion by Dr Whitney Muse today [name] today X-geva monthly lupron every 6 months   Add on PSA and CBC to todays labs, we will call you with those results  Continue taking zytiga  Call us if your blood sugars keep being high, or if you keep getting weaker  Return to see the doctor in 1 month Please call the clinic if you have any questions or concerns     Thank you for choosing [place] at [facility] to provide your oncology and hematology care.  To afford each patient quality time with our provider, please arrive at least 15 minutes before your scheduled appointment time.   Beginning January 23rd 2017 lab work for the [name] will be done in the  Main lab at [facility] on 1st floor. If you have a lab appointment with the [facility] please come in thru the  Main Entrance and check in at the main information desk  You need to re-schedule your appointment should you arrive 10 or more minutes late.  We strive to give you quality time with our providers, and arriving late affects you and other patients whose appointments are after yours.  Also, if you no show three or more times for appointments you may be dismissed from the clinic at the providers discretion.     Again, thank you for choosing [facility].  Our hope is that these requests will decrease the amount of time that you wait before being seen by our physicians.       _____________________________________________________________  Should you have questions after your visit to [facility], please contact our office at [phone] between the hours of 8:30 a.m. and 4:30 p.m.  Voicemails left after 4:30 p.m. will not be returned until the following business day.  For prescription refill  requests, have your pharmacy contact our office.         Resources For Cancer Patients and their Caregivers ? American Cancer Society: Can assist with transportation, wigs, general needs, runs Look Good Feel Better.        225-578-8012 ? Cancer Care: Provides financial assistance, online support groups, medication/co-pay assistance.  1-800-813-HOPE (440)228-2725) ? [place] Assists [place] cancer patients and their families through emotional , educational and financial support.  425-342-3874 ? [place] DSS Where to apply for food stamps, Medicaid and utility assistance. (539)081-2087 ? [facility]: Transportation to medical appointments. 438-443-4463 ? Social Security Administration: May apply for disability if have a Stage IV cancer. 709 710 3428 (403) 835-9555 ? [employer], Disability and Transit Services: Assists with nutrition, care and transit needs. 223-406-8065

## 2015-07-24 NOTE — Progress Notes (Signed)
[name],[name], MD [place] / [place] [address]   DIAGNOSIS: NHL (non-Hodgkin's lymphoma)   Staging form: Lymphoid Neoplasms, AJCC 6th Edition     Clinical: Stage III - Signed by Baird Cancer, PA on 08/18/2011  1. 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 Significant vasomotor instability on full dose Casodex.  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 Margot Chimes in October 2005  #3. Diffuse large B-cell lymphoma clinically stage IIIAS STATUS post cervical lymph node biopsy on 08/03/2003. His lymphoma CD20 positive and PET scan showed positivity in the spleen. Bone marrow aspiration and biopsy were negative however. He received R. CHOP x6 cycles with a complete remission established by PET scan criteria on 11/23/2003. He completed 6 full cycles total. He remains without disease recurrence. He remains free of B. symptomatology.  #4 Zytiga Therapy secondary to development of castration resistant prostate cancer (01/2015) , XGEVA   CURRENT THERAPY: Lupron every 6 months, XGEVA, Zytiga  INTERVAL HISTORY: [name] 82 y.o. male returns for follow up.He is doing well. He has no problems tolerating Zytiga.    [name] was here with his wife.  They just got back from [place]. Was pretty good but had a lot of trouble. He had right leg pain that started in his hip and went down past his knee. He had x-rays done on this and they told him it was arthritis but they didn't tell him much. He said that he had to use his hand to pull his leg in the car. This lasted 3 weeks and he said that he didn't do anything different to bring this on. This is better now; he notes it is basically gone.   He also had a rash on his buttocks. Last time they were here it was red but limited. When he went to [place] it got worse and was on both  buttocks and both of his feet. They went to urgent care there and was told  that it was a fungus. He was given (it sounds like diflucan) It got better but it did not completley go away. A little bit later it came back and he went back to urgent care. He was given another course of medication, now it is completley gone.   He is also dizzy when he is on his feet and when he looks up. He said "I don't think I could walk in a straight line if they checked me" He does not know why this happens. If he looks up real quick he gets dizzy. He said that his flag tore at his house and was pulling it down and fell right back. He was wondering if this was because of his medication because he has been having more trouble since he started taking it. His wife also said that she thinks that he fell another time when he was working in the yard on his knees.  His blood sugar has been high recently and he has not been able to eat his ice cream because of this. He said the last 4 or 5 weeks it hasn't been high. Last week it was 115 and this morning it was 135. His wife said that sometimes it shoots up to 200 and they don't know why. They watch it closely. (They have been told his [name] can do this)  He said his eating has been well.  He said that he feels like he's 26 now. For the past 6 months he feels like he has been going downhill.  MEDICAL HISTORY: Past Medical History  Diagnosis Date  . Hypertension   . Atrial fibrillation ([place])     Dr. Harl Bowie- [facility] follows saw 11'14  . GIST (gastrointestinal stromal tumor), malignant ([place]) 08/18/2011    Gastrointestinal stromal tumor, that is GIST, small bowel, 4.5 cm, intermediate prognostic grade found on the PET scan in the small bowel, accounting for that small bowel activity in October 2005 with resection by Dr. Margot Chimes, thus far without recurrence.   . NHL (non-Hodgkin's lymphoma) ([place]) 08/18/2011    Diffuse large B-cell lymphoma, clinically stage IIIA, CD20 positive,  status post cervical lymph node biopsy 08/03/2003 on the left. PET scan was also positive in the spleen and small bowel region, but bone marrow aspiration and biopsy were negative. So he essentially had stage IIIAs. He received R-CHOP x6 cycles with CR established by PET scan criteria on 11/23/2003 with no evidence for relapse th  . Cataracts, both eyes     surgery planned May 2015  . Bladder stones     tx. with oral meds and antibiotics, now surgery planned  . Diabetes mellitus without complication (HCC)     has HYPERTENSION, UNSPECIFIED; ATRIAL FIBRILLATION; Long term (current) use of anticoagulants; NHL (non-Hodgkin's lymphoma) ([place]); GIST (gastrointestinal stromal tumor), malignant ([place]); Encounter for therapeutic drug monitoring; Hypertrophy of prostate with urinary obstruction and other lower urinary tract symptoms (LUTS); and Prostate cancer ([place]) on his problem list.     has No Known Allergies.  Current Outpatient Prescriptions on File Prior to Visit  Medication Sig Dispense Refill  . abiraterone Acetate (ZYTIGA) 250 MG tablet Take 4 tablets (1,000 mg total) by mouth daily. Take on an empty stomach 1 hour before or 2 hours after a meal 120 tablet 6  . acetaminophen (TYLENOL) 500 MG tablet Take 500 mg by mouth every 6 (six) hours as needed for mild pain.     Marland Kitchen amitriptyline (ELAVIL) 25 MG tablet Take 25 mg by mouth at bedtime.    . bicalutamide (CASODEX) 50 MG tablet Take 25 mg by mouth daily.    . Calcium Carb-Cholecalciferol 600-800 MG-UNIT TABS Take 1 capsule by mouth at bedtime.    . Cholecalciferol (VITAMIN D) 2000 UNITS CAPS Take 1 capsule by mouth daily.    . Cranberry 200 MG CAPS Take 4,200 mg by mouth every morning.     . diltiazem (DILACOR XR) 180 MG 24 hr capsule Take 1 capsule (180 mg total) by mouth every evening. 90 capsule 1  . Docusate Calcium (STOOL SOFTENER PO) Take 1 tablet by mouth 2 (two) times daily.     Marland Kitchen glipiZIDE (GLUCOTROL) 5 MG tablet Take by mouth daily  before breakfast.    . glucose blood (ACCU-CHEK ACTIVE STRIPS) test strip Use as instructed 100 each 12  . Lancets (ONETOUCH ULTRASOFT) lancets Use as instructed 100 each 12  . Leuprolide Acetate, 6 Month, (LUPRON DEPOT) 45 MG injection Inject 45 mg into the muscle every 6 (six) months.    . linagliptin (TRADJENTA) 5 MG TABS tablet Take 5 mg by mouth at bedtime.     Marland Kitchen lisinopril-hydrochlorothiazide (PRINZIDE,ZESTORETIC) 20-25 MG tablet Take 1 tablet by mouth daily. 90 tablet 0  . metFORMIN (GLUCOPHAGE) 500 MG tablet Take 500 mg by mouth 2 (two) times daily with a meal.     .  omeprazole (PRILOSEC) 20 MG capsule Take 20 mg by mouth daily.     . potassium chloride (K-DUR,KLOR-CON) 10 MEQ tablet Take 1 tablet (10 mEq total) by mouth daily. 90 tablet 3  . predniSONE (DELTASONE) 5 MG tablet Take 1 tablet (5 mg total) by mouth 2 (two) times daily with a meal. 180 tablet 0  . SENNA PO Take 1 capsule by mouth as needed.    . warfarin (COUMADIN) 2.5 MG tablet Take 2 tablets daily except 1 tablet on Mondays, Wednesdays and Fridays or as directed by [facility] 60 tablet 3   No current facility-administered medications on file prior to visit.    SURGICAL HISTORY: Past Surgical History  Procedure Laterality Date  . Cholecystectomy    . Incision and drainage perirectal abscess    . Portacath placement      insertion and removal -last chemotherapy 10 yrs ago  . Lymph node dissection Left     '05  . Port-a-cath removal    . Transurethral resection of prostate N/A 08/10/2013    Procedure: TRANSURETHRAL RESECTION OF THE PROSTATE WITH GYRUS INSTRUMENTS;  Surgeon: [name], MD;  Location: [facility];  Service: Urology;  Laterality: N/A;  . Cystoscopy with litholapaxy N/A 08/10/2013    Procedure: CYSTOSCOPY WITH LITHOLAPAXY WITH [name];  Surgeon: [name], MD;  Location: [facility];  Service: Urology;  Laterality: N/A;  . Cataract extraction Right 08/2013    SOCIAL HISTORY: Social  History   Social History  . Marital Status: Married    Spouse Name: N/A  . Number of Children: N/A  . Years of Education: N/A   Occupational History  . retired     Hotel manager   Social History Main Topics  . Smoking status: Never Smoker   . Smokeless tobacco: Never Used  . Alcohol Use: No  . Drug Use: No  . Sexual Activity: Not Currently   Other Topics Concern  . Not on file   Social History Narrative  He has 8 grandchildren. He takes care of his garden and mows his lawn. 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  FAMILY HISTORY: History reviewed. No pertinent family history.  Father died of pneumonia when the patient was 38 year of age Mother died of MI at 69 All of his siblings are deceased  Review of Systems  Constitutional: Negative for fever, chills, weight loss and malaise/fatigue, hot flashes.  HENT: Negative for congestion, hearing loss, nosebleeds, sore throat and tinnitus.   Eyes: Negative for blurred vision, double vision, pain and discharge.  Respiratory: Negative for cough, hemoptysis, sputum production, shortness of breath and wheezing.   Cardiovascular: Negative for chest pain, palpitations, claudication, leg swelling and PND.  Gastrointestinal: Negative for heartburn, nausea, vomiting, abdominal pain, diarrhea, constipation, blood in stool and melena.  Genitourinary: Negative for dysuria, urgency, frequency and hematuria.  Musculoskeletal: Positive for leg pain. Negative for myalgias, joint pain and falls.  Left leg pain starts in his hip and goes down past his knee Skin: Positive for rash. Negative for itching Rash was on both buttocks and feet. Was given Diflucan and this got rid of it.  Neurological: Positive for dizziness Negative for tingling, tremors, sensory change, speech change, focal weakness, seizures, loss of consciousness, weakness and headaches.  Dizziness on his feet and when he looks up. He has fallen because of this. Endo/Heme/Allergies: Does not bruise/bleed  easily.  Psychiatric/Behavioral: Negative for depression, suicidal ideas, memory loss and substance abuse. The patient is not nervous/anxious and does not have  insomnia.    14 point review of systems was performed and is negative except as detailed under history of present illness and above   PHYSICAL EXAMINATION  ECOG PERFORMANCE STATUS: 0 - Asymptomatic  Filed Vitals:   07/24/15 1126  BP: 118/70  Pulse: 80  Temp: 97.7 F (36.5 C)  Resp: 16    Physical Exam  Constitutional: He is oriented to person, place, and time and well-developed, well-nourished, and in no distress.  HENT:  Head: Normocephalic and atraumatic.  Nose: Nose normal.  Mouth/Throat: Oropharynx is clear and moist. No oropharyngeal exudate.  Eyes: Conjunctivae and EOM are normal. Pupils are equal, round, and reactive to light. Right eye exhibits no discharge. Left eye exhibits no discharge. No scleral icterus.  Neck: Normal range of motion. Neck supple. No tracheal deviation present. No thyromegaly present.  Cardiovascular: Normal rate, regular rhythm and normal heart sounds.  Exam reveals no gallop and no friction rub.   No murmur heard. Pulmonary/Chest: Effort normal and breath sounds normal. He has no wheezes. He has no rales.  Abdominal: Soft. Bowel sounds are normal. He exhibits no distension and no mass. There is no tenderness. There is no rebound and no guarding.  Musculoskeletal: Normal range of motion. He exhibits no edema.  Lymphadenopathy:    He has no cervical adenopathy.  Neurological: He is alert and oriented to person, place, and time. He has normal reflexes. No cranial nerve deficit. Gait normal. Coordination normal.  Skin: Skin is warm and dry. No rash noted.  Psychiatric: Mood, memory, affect and judgment normal.  Nursing note and vitals reviewed.    LABORATORY DATA: I have reviewed the labs below.  CBC    Component Value Date/Time   WBC 8.9 07/24/2015 1006   RBC 4.42 07/24/2015 1006    RBC 4.77 09/08/2013 1047   HGB 14.6 07/24/2015 1006   HCT 41.0 07/24/2015 1006   PLT 249 07/24/2015 1006   MCV 92.8 07/24/2015 1006   MCH 33.0 07/24/2015 1006   MCHC 35.6 07/24/2015 1006   RDW 13.1 07/24/2015 1006   LYMPHSABS 3.5 07/24/2015 1006   MONOABS 0.8 07/24/2015 1006   EOSABS 0.0 07/24/2015 1006   BASOSABS 0.0 07/24/2015 1006   CMP     Component Value Date/Time   NA 140 07/24/2015 1006   K 3.5 07/24/2015 1006   CL 100* 07/24/2015 1006   CO2 29 07/24/2015 1006   GLUCOSE 148* 07/24/2015 1006   BUN 20 07/24/2015 1006   CREATININE 0.83 07/24/2015 1006   CREATININE 1.14 07/27/2013 1004   CALCIUM 9.4 07/24/2015 1006   PROT 6.7 07/24/2015 1006   ALBUMIN 4.0 07/24/2015 1006   AST 18 07/24/2015 1006   ALT 18 07/24/2015 1006   ALKPHOS 62 07/24/2015 1006   BILITOT 1.1 07/24/2015 1006   GFRNONAA >60 07/24/2015 1006   GFRNONAA 58* 07/27/2013 1004   GFRAA >60 07/24/2015 1006   GFRAA 66 07/27/2013 1004   Results for TRAVAS, ORNDOFF (MRN [id number]) as of 07/24/2015 16:56  Ref. Range 12/11/2014 09:16 01/09/2015 10:23 01/25/2015 10:18 03/06/2015 10:52 04/02/2015 10:45  PSA Latest Ref Range: 0.00-4.00 ng/mL 9.95 (H) 12.53 (H) 12.22 (H) 0.27 0.03    RADIOLOGY: I have personally reviewed the radiological images as listed and agreed with the findings in the report.  EXAM: DUAL X-RAY ABSORPTIOMETRY (DXA) FOR BONE MINERAL DENSITY  IMPRESSION: Ordering Physician: Dr. Patrici Ranks,  Your patient [name] completed a BMD test on 09/19/2014 using the Keyes (  software version: 14.10) manufactured by [employer]. The following summarizes the results of our evaluation. PATIENT BIOGRAPHICAL: Name: DIERKS, MCLAUCHLAN Patient ID: [id number] Birth Date: 1923-07-04 Height: 70.5 in. Gender: Male Exam Date: 09/19/2014 Weight: 188.0 lbs. Indications: Caucasian, Height Loss, Low Calcium Intake, Prostate Cancer Fractures: Treatments: DENSITOMETRY  RESULTS: Site Region Measured Date Measured Age WHO Classification Young Adult T-score BMD %Change vs. Previous Significant Change (*) DualFemur Neck Right 09/19/2014 88.6 N/A -0.7 0.975 g/cm2  Left Forearm Radius 33% 09/19/2014 88.6 N/A -0.5 0.767 g/cm2 ASSESSMENT: BMD as determined from Femur Neck Right is 0.975 g/cm2 with a T-Score of -0.7. This patient is considered normal by World [facility]al[facility]) Criteria. (Lumbar spine was not utilized due to advanced degenerative changes.) (Patient does not meet criteria for FRAX assessment.)  World Health Organization [facility]) criteria for post-menopausal, Caucasian Women: Normal: T-score at or above -1 SD Osteopenia: T-score between -1 and -2.5 SD Osteoporosis: T-score at or below -2.5 SD  RECOMMENDATIONS: [place] recommends that FDA-approved medial therapies be considered in postmenopausal women and men age 7 or older with a: 1. Hip or vertberal (clinical or morphometric) fracture. 2. T-Score of < -2.5 at the spine or hip. 3. Ten-year fracture probability by FRAX of 3% or greater for hip fracture or 20% or greater for major osteoporotic fracture.  All treatment decisions require clinical judgment and consideration of indiviual patient factors, including patient preferences, co-morbidities, previous drug use, risk factors not captured in the FRAX model (e.g. falls, vitamin D deficiency, increased bone turnover, interval significant decline in bone density) and possible under-or over-estimation of fracture risk by FRAX.  All patients should ensure an adequate intake of dietary calcium (1200 mg/d) and vitamin D (800 IU daily) unless contraindicated.  FOLLOW-UP: People with diagnosed cases of osteoporosis or osteopenia should be regularly tested for bone mineral density. For patients eligible for Medicare, routine testing is allowed once every 2 years.  Testing frequency can be increased for patients who have rapidly progressing disease, or for those who are receiving medical therapy to restore bone mass.  I have reviewed this report, and agree with the above findings.  [facility]   Electronically Signed  By: Rolm Baptise M.D.  On: 09/19/2014 09:42    CLINICAL DATA: Prostate cancer diagnosed in 2005 with recent bladder stones. Rising PSA. Diabetes. Hypertension. Lymphoma in 2013. Gastrointestinal stromal tumor in 2013.  EXAM: CT CHEST, ABDOMEN, AND PELVIS WITH CONTRAST IMPRESSION: 1. Osseous metastasis. When correlated with today's bone scan, felt to be slightly progressive. 2. No evidence of extraosseous metastasis. 3. Esophageal air fluid level suggests dysmotility or gastroesophageal reflux. 4. Small bladder saccules, suggesting a component of outlet obstruction.   Electronically Signed  By: Abigail Miyamoto M.D.  On: 10/05/2014 16:13    ASSESSMENT and THERAPY PLAN:  Stage IV adenocarcinoma of the prostate Lupron therapy/Casodex Rising PSA with value of 3.11 ng/ml on 09/10/2014  DEXA with normal bone density XGEVA monthly Casodex discontinued with continued rise of PSA Zytiga 01/2015   I reminded them that ZYTIGA can cause hyperglycemia. We have discussed this in the past. I have asked him to keep a log of his blood sugars and bring them back in to his next follow-up. I have encouraged him to follow-up with his pcp here as well. I will schedule him for a lipid profile if he is willing.   He was advised to go to physical therapy for his leg pain. We discussed discontinuing Zytiga if he  really feels it is adversely affecting his QOL. He does not wish to do this. He has been advised to call if any of his symptoms worsen.  He will return in a month for a follow up.   All questions were answered. The patient knows to call the clinic with any problems, questions or concerns. We can certainly  see the patient much sooner if necessary.   This document serves as a record of services personally performed by Ancil Linsey, MD. It was created on her behalf by Kandace Blitz, a trained medical scribe. The creation of this record is based on the scribe's personal observations and the provider's statements to them. This document has been checked and approved by the attending provider.  I have reviewed the above documentation for accuracy and completeness, and I agree with the above.  This note was electronically signed.  Kelby Fam. Camyra Vaeth MD

## 2015-07-24 NOTE — Progress Notes (Signed)
[name] presents today for injection per MD orders. Xgeva 120 mg  administered SQ in left Abdomen. Administration without incident. Patient tolerated well.    

## 2015-07-31 DIAGNOSIS — M069 Rheumatoid arthritis, unspecified: Secondary | ICD-10-CM | POA: Diagnosis not present

## 2015-07-31 DIAGNOSIS — I482 Chronic atrial fibrillation: Secondary | ICD-10-CM | POA: Diagnosis not present

## 2015-07-31 DIAGNOSIS — E1165 Type 2 diabetes mellitus with hyperglycemia: Secondary | ICD-10-CM | POA: Diagnosis not present

## 2015-07-31 DIAGNOSIS — D649 Anemia, unspecified: Secondary | ICD-10-CM | POA: Diagnosis not present

## 2015-07-31 DIAGNOSIS — N390 Urinary tract infection, site not specified: Secondary | ICD-10-CM | POA: Diagnosis not present

## 2015-07-31 DIAGNOSIS — C8580 Other specified types of non-Hodgkin lymphoma, unspecified site: Secondary | ICD-10-CM | POA: Diagnosis not present

## 2015-07-31 DIAGNOSIS — I10 Essential (primary) hypertension: Secondary | ICD-10-CM | POA: Diagnosis not present

## 2015-07-31 DIAGNOSIS — R42 Dizziness and giddiness: Secondary | ICD-10-CM | POA: Diagnosis not present

## 2015-07-31 DIAGNOSIS — I2510 Atherosclerotic heart disease of native coronary artery without angina pectoris: Secondary | ICD-10-CM | POA: Diagnosis not present

## 2015-07-31 DIAGNOSIS — M549 Dorsalgia, unspecified: Secondary | ICD-10-CM | POA: Diagnosis not present

## 2015-07-31 DIAGNOSIS — E119 Type 2 diabetes mellitus without complications: Secondary | ICD-10-CM | POA: Diagnosis not present

## 2015-08-08 ENCOUNTER — Telehealth (HOSPITAL_COMMUNITY): Payer: Self-pay | Admitting: *Deleted

## 2015-08-08 NOTE — Telephone Encounter (Signed)
Pt is hurting all the time in both legs. It is affecting his ability to sleep. If he does much of anything both of his legs will be hurting. Daughter states that the reason for the pain is worrying him more than the pain itself. Appt scheduled for 08/09/15 with Tom.

## 2015-08-12 ENCOUNTER — Other Ambulatory Visit: Payer: Self-pay

## 2015-08-12 MED ORDER — LISINOPRIL-HYDROCHLOROTHIAZIDE 20-25 MG PO TABS: 1.0000 | ORAL_TABLET | Freq: Every day | ORAL | Status: DC

## 2015-08-12 MED ORDER — DILTIAZEM HCL ER 180 MG PO CP24: 180.0000 mg | ORAL_CAPSULE | Freq: Every evening | ORAL | Status: DC

## 2015-08-12 MED ORDER — WARFARIN SODIUM 2.5 MG PO TABS: ORAL_TABLET | ORAL | Status: DC

## 2015-08-12 NOTE — Telephone Encounter (Signed)
Refills complete 

## 2015-08-12 NOTE — Progress Notes (Signed)
Patient is being seen today as a work-in for leg pain. He is accompanied by his daughter.  Patient reports that in [place] he developed right hip pain radiated down to his right knee. He said this lasted for approximately 3 weeks. He did undergo x-ray imaging in [place] and was found to have "arthritis."  He also notes some "hip pain."  Now he reports that his pain is in his right knee and radiates downward. He said that he sleeps with a pillow between his legs which helps with the discomfort. He is now the discomfort is both legs. He also notes low back pain. He reports that his back pain is greater than his leg pain. When asked how long his back pain has been present, he reports many months.  His daughter is concerned about his discomfort, but I get the impression that he tells her one thing (with regards to his complaints) and is downplaying what he is telling me.  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  He was lowering the flag and lost his balance, falling backwards.  He reports that his back pain pre-dates this incidence.  He notes some dizziness with positional changes.  BP 146/87 mmHg  Pulse 100  Temp(Src) 97.9 F (36.6 C) (Oral)  Resp 18  Wt 187 lb (84.823 kg)  SpO2 100% Gen: NAD, pleasant, accompanied by his daughter. Skin: Warm and dry HEENT: Atraumatic, normocephalic  Neck: Trachea is midline, supple Cardiac: Irregularly, irregular without murmur, rub, or gallop.  Normal S1 and S2. Lungs: CTA B/L without wheezes, rales, or rhonchi. Extremities: Negative straight leg test bilaterally.  B/L LE edema, L>R trace- 1 + pitting.  Tenderness to palpation of right popliteal area.  No erythema or heat.  No tenderness to deep palpation of calves B/L.  Normal ROM of knee.  No palpation of lateral aspects of pelvis. Back: No tenderness to deep spinal palpation.  Minimal tenderness to palpation of coccyx. Neuro: Intact without any focal  deficits.  Assessment: 1. Presumed L-spine back pain from DDD at L2-3 and L3-4 and retrolisthesis with spondylosis at L4-5. 2. Right popliteal discomfort 3. B/L LE edema, trace-1+ pitting, L>R. 4. Hip pains from arthritis 5. Known sacral osseous lesion.  Plan: 1. [place] of R LE to evaluate for DVT.  [place] is negative for DVT 2. DG L-spine/sacrum for low back pain- diffuse degenerative changes, right sacral area of sclerosis corresponding to sclerotic area on prior CT and MRI, likely metastasis, no acute findings 3. DB B/L hips- no acute or focal bony abnormality. 4. Negative work-up.  Pain is likely chronic secondary to arthritic changes.   5. Encouraged to increase PO H2O for orthostatic symptoms 6. Continue with treatment as planned. 7. Return as scheduled for follow-up.  Patient and plan discussed with Dr. Ancil Linsey and she is in agreement with the aforementioned.   KEFALAS,THOMAS, PA-C 08/13/2015 1:28 PM

## 2015-08-13 ENCOUNTER — Encounter (HOSPITAL_BASED_OUTPATIENT_CLINIC_OR_DEPARTMENT_OTHER): Payer: Medicare Other | Admitting: Oncology

## 2015-08-13 ENCOUNTER — Ambulatory Visit (HOSPITAL_COMMUNITY)
Admission: RE | Admit: 2015-08-13 | Discharge: 2015-08-13 | Disposition: A | Discharge status: Home / Self Care | Payer: Medicare Other | Source: Ambulatory Visit | Attending: Oncology | Admitting: Oncology

## 2015-08-13 ENCOUNTER — Encounter (HOSPITAL_COMMUNITY): Payer: Self-pay | Admitting: Oncology

## 2015-08-13 ENCOUNTER — Ambulatory Visit (INDEPENDENT_AMBULATORY_CARE_PROVIDER_SITE_OTHER): Payer: Medicare Other | Admitting: Urology

## 2015-08-13 VITALS — BP 146/87 | HR 100 | Temp 97.9°F | Resp 18 | Wt 187.0 lb

## 2015-08-13 DIAGNOSIS — M79605 Pain in left leg: Secondary | ICD-10-CM

## 2015-08-13 DIAGNOSIS — R937 Abnormal findings on diagnostic imaging of other parts of musculoskeletal system: Secondary | ICD-10-CM | POA: Diagnosis not present

## 2015-08-13 DIAGNOSIS — C61 Malignant neoplasm of prostate: Secondary | ICD-10-CM

## 2015-08-13 DIAGNOSIS — M79604 Pain in right leg: Secondary | ICD-10-CM | POA: Diagnosis not present

## 2015-08-13 DIAGNOSIS — I8000 Phlebitis and thrombophlebitis of superficial vessels of unspecified lower extremity: Secondary | ICD-10-CM | POA: Insufficient documentation

## 2015-08-13 DIAGNOSIS — M25551 Pain in right hip: Secondary | ICD-10-CM

## 2015-08-13 DIAGNOSIS — M25552 Pain in left hip: Secondary | ICD-10-CM

## 2015-08-13 DIAGNOSIS — M545 Low back pain, unspecified: Secondary | ICD-10-CM

## 2015-08-13 DIAGNOSIS — C7951 Secondary malignant neoplasm of bone: Secondary | ICD-10-CM

## 2015-08-13 DIAGNOSIS — E291 Testicular hypofunction: Secondary | ICD-10-CM | POA: Diagnosis not present

## 2015-08-13 DIAGNOSIS — N390 Urinary tract infection, site not specified: Secondary | ICD-10-CM | POA: Diagnosis not present

## 2015-08-13 DIAGNOSIS — R3915 Urgency of urination: Secondary | ICD-10-CM | POA: Diagnosis not present

## 2015-08-13 DIAGNOSIS — M47816 Spondylosis without myelopathy or radiculopathy, lumbar region: Secondary | ICD-10-CM | POA: Diagnosis not present

## 2015-08-13 NOTE — Patient Instructions (Signed)
[name] at [facility] Discharge Instructions  RECOMMENDATIONS MADE BY THE CONSULTANT AND ANY TEST RESULTS WILL BE SENT TO YOUR REFERRING PHYSICIAN.  Sacral xray and hip x ray U/s right leg today  Return as scheduled  [name] appt today   Thank you for choosing [place] at [facility] to provide your oncology and hematology care.  To afford each patient quality time with our provider, please arrive at least 15 minutes before your scheduled appointment time.   Beginning January 23rd 2017 lab work for the [name] will be done in the  Main lab at [facility] on 1st floor. If you have a lab appointment with the [facility] please come in thru the  Main Entrance and check in at the main information desk  You need to re-schedule your appointment should you arrive 10 or more minutes late.  We strive to give you quality time with our providers, and arriving late affects you and other patients whose appointments are after yours.  Also, if you no show three or more times for appointments you may be dismissed from the clinic at the providers discretion.     Again, [facility] you [facility].  Our hope is that these requests will decrease the amount of time that you wait before being seen by our physicians.       _____________________________________________________________  Should you have questions after your visit to [facility], please contact our office at [phone] between the hours of 8:30 a.m. and 4:30 p.m.  Voicemails left after 4:30 p.m. will not be returned until the following business day.  For prescription refill requests, have your pharmacy contact our office.         Resources For Cancer Patients and their Caregivers ? American Cancer Society: Can assist with transportation, wigs, general needs, runs Look Good Feel Better.        (516) 019-8712 ? Cancer Care: Provides financial  assistance, online support groups, medication/co-pay assistance.  1-800-813-HOPE (928)150-4884) ? [facility] Assists [place] cancer patients and their families through emotional , educational and financial support.  548 289 5070 ? [place] DSS Where to apply for food stamps, Medicaid and utility assistance. (719)077-4773 ? [facility]: Transportation to medical appointments. 680-265-6109 ? Social Security Administration: May apply for disability if have a Stage IV cancer. ([phone] 031-9511 478-127-4104 ? [employer], Disability and Transit Services: Assists with nutrition, care and transit needs. 678-009-8256

## 2015-08-21 ENCOUNTER — Telehealth: Payer: Self-pay | Admitting: *Deleted

## 2015-08-21 ENCOUNTER — Telehealth: Payer: Self-pay | Admitting: Cardiology

## 2015-08-21 ENCOUNTER — Encounter (HOSPITAL_COMMUNITY): Payer: Medicare Other

## 2015-08-21 ENCOUNTER — Encounter (HOSPITAL_COMMUNITY): Payer: Medicare Other | Attending: Hematology & Oncology | Admitting: Hematology & Oncology

## 2015-08-21 ENCOUNTER — Encounter (HOSPITAL_COMMUNITY): Payer: Self-pay | Admitting: Hematology & Oncology

## 2015-08-21 ENCOUNTER — Other Ambulatory Visit (HOSPITAL_COMMUNITY): Payer: Medicare Other

## 2015-08-21 ENCOUNTER — Encounter (HOSPITAL_BASED_OUTPATIENT_CLINIC_OR_DEPARTMENT_OTHER): Payer: Medicare Other

## 2015-08-21 ENCOUNTER — Ambulatory Visit (INDEPENDENT_AMBULATORY_CARE_PROVIDER_SITE_OTHER): Payer: Medicare Other | Admitting: *Deleted

## 2015-08-21 VITALS — BP 129/86 | HR 111 | Temp 97.9°F | Resp 18 | Wt 186.0 lb

## 2015-08-21 DIAGNOSIS — M545 Low back pain: Secondary | ICD-10-CM | POA: Insufficient documentation

## 2015-08-21 DIAGNOSIS — Z7901 Long term (current) use of anticoagulants: Secondary | ICD-10-CM

## 2015-08-21 DIAGNOSIS — C7951 Secondary malignant neoplasm of bone: Secondary | ICD-10-CM

## 2015-08-21 DIAGNOSIS — Z8572 Personal history of non-Hodgkin lymphomas: Secondary | ICD-10-CM | POA: Diagnosis not present

## 2015-08-21 DIAGNOSIS — M79606 Pain in leg, unspecified: Secondary | ICD-10-CM | POA: Insufficient documentation

## 2015-08-21 DIAGNOSIS — Z79899 Other long term (current) drug therapy: Secondary | ICD-10-CM

## 2015-08-21 DIAGNOSIS — M25552 Pain in left hip: Secondary | ICD-10-CM | POA: Diagnosis not present

## 2015-08-21 DIAGNOSIS — M25551 Pain in right hip: Secondary | ICD-10-CM | POA: Diagnosis not present

## 2015-08-21 DIAGNOSIS — Z5181 Encounter for therapeutic drug level monitoring: Secondary | ICD-10-CM | POA: Diagnosis not present

## 2015-08-21 DIAGNOSIS — M1990 Unspecified osteoarthritis, unspecified site: Secondary | ICD-10-CM | POA: Insufficient documentation

## 2015-08-21 DIAGNOSIS — I4891 Unspecified atrial fibrillation: Secondary | ICD-10-CM

## 2015-08-21 DIAGNOSIS — C61 Malignant neoplasm of prostate: Secondary | ICD-10-CM | POA: Diagnosis not present

## 2015-08-21 HISTORY — DX: Secondary malignant neoplasm of bone: C79.51

## 2015-08-21 LAB — POCT INR: INR: 1.8

## 2015-08-21 LAB — COMPREHENSIVE METABOLIC PANEL
ALT: 18 U/L (ref 17–63)
AST: 19 U/L (ref 15–41)
Albumin: 3.8 g/dL (ref 3.5–5.0)
Alkaline Phosphatase: 66 U/L (ref 38–126)
Anion gap: 9 (ref 5–15)
BUN: 24 mg/dL — ABNORMAL HIGH (ref 6–20)
CO2: 30 mmol/L (ref 22–32)
Calcium: 9.7 mg/dL (ref 8.9–10.3)
Chloride: 99 mmol/L — ABNORMAL LOW (ref 101–111)
Creatinine, Ser: 1.07 mg/dL (ref 0.61–1.24)
GFR calc Af Amer: >60 mL/min (ref >60)
GFR calc non Af Amer: 59 mL/min — ABNORMAL LOW (ref >60)
Glucose, Bld: 195 mg/dL — ABNORMAL HIGH (ref 65–99)
Potassium: 4.4 mmol/L (ref 3.5–5.1)
Sodium: 138 mmol/L (ref 135–145)
Total Bilirubin: 1.3 mg/dL — ABNORMAL HIGH (ref 0.3–1.2)
Total Protein: 6.8 g/dL (ref 6.5–8.1)

## 2015-08-21 MED ORDER — DENOSUMAB 120 MG/1.7ML ~~LOC~~ SOLN
120.0000 mg | Freq: Once | SUBCUTANEOUS | Status: AC
  Administered 2015-08-21: 120 mg via SUBCUTANEOUS
  Filled 2015-08-21: qty 1.7

## 2015-08-21 MED ORDER — DILTIAZEM HCL ER 180 MG PO CP24: 180.0000 mg | ORAL_CAPSULE | Freq: Every evening | ORAL | Status: DC

## 2015-08-21 NOTE — Progress Notes (Signed)
[name],[name], MD [name]   DIAGNOSIS: NHL (non-Hodgkin's lymphoma)   Staging form: Lymphoid Neoplasms, AJCC 6th Edition     Clinical: Stage III - Signed by [name] Cancer, PA on 08/18/2011  1. 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 Significant vasomotor instability on full dose Casodex.  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 Margot Foster in October 2005  #3. Diffuse large B-cell lymphoma clinically stage IIIAS STATUS post cervical lymph node biopsy on 08/03/2003. His lymphoma CD20 positive and PET scan showed positivity in the spleen. Bone marrow aspiration and biopsy were negative however. He received R. CHOP x6 cycles with a complete remission established by PET scan criteria on 11/23/2003. He completed 6 full cycles total. He remains without disease recurrence. He remains free of B. symptomatology.  #4 Zytiga Therapy secondary to development of castration resistant prostate Foster (01/2015) , XGEVA   CURRENT THERAPY: Lupron every 6 months, XGEVA, Zytiga  INTERVAL HISTORY: [name] 82 y.o. male returns for of castrate resistant prostate carcinoma. He continues on [place], XGEVA and Lupron.    [name] is accompanied by his wife. I personally reviewed and went over PSA levels with the patient. He is here to receive his Delton See today.  His hip pain continues. The patient notes [name] suggested a bone scan to investigate the hip pain. His leg and back also hurt. Reports after he is up a while the pain starts to go away, though he is not able to get much done. His main complaint is pain.  He has been eating a little. He is able to mow the grass, his wife adds it take him two hours to do so. Denies hematuria or bowel issues.  Reports stomach pain which precedes his hot flashes.  This has been happening over the last few months. The stomach pain is not severe, but rather just irritated and lets him know a hot flash is coming on.  He denies nausea, vomiting, significant change in appetite.   MEDICAL HISTORY: Past Medical History  Diagnosis Date  . Hypertension   . Atrial fibrillation ([place])     [name] follows saw 11'14  . GIST (gastrointestinal stromal tumor), malignant ([place]) 08/18/2011    Gastrointestinal stromal tumor, that is GIST, small bowel, 4.5 cm, intermediate prognostic grade found on the PET scan in the small bowel, accounting for that small bowel activity in October 2005 with resection by Dr. Margot Foster, thus far without recurrence.   . NHL (non-Hodgkin's lymphoma) ([place]) 08/18/2011    Diffuse large B-cell lymphoma, clinically stage IIIA, CD20 positive, status post cervical lymph node biopsy 08/03/2003 on the left. PET scan was also positive in the spleen and small bowel region, but bone marrow aspiration and biopsy were negative. So he essentially had stage IIIAs. He received R-CHOP x6 cycles with CR established by PET scan criteria on 11/23/2003 with no evidence for relapse th  . Cataracts, both eyes     surgery planned May 2015  . Bladder stones     tx. with oral meds and antibiotics, now surgery planned  . Diabetes mellitus without complication (HCC)     has HYPERTENSION, UNSPECIFIED; ATRIAL FIBRILLATION; Long term (current) use of anticoagulants; NHL (non-Hodgkin's lymphoma) ([place]); GIST (gastrointestinal stromal tumor), malignant ([place]); Encounter for therapeutic drug monitoring; Hypertrophy  of prostate with urinary obstruction and other lower urinary tract symptoms (LUTS); Prostate Foster [facility]); and Bone metastases ([place]) on his problem list.     has No Known Allergies.  Current Outpatient Prescriptions on File Prior to Visit  Medication Sig Dispense Refill  . abiraterone Acetate (ZYTIGA) 250 MG tablet Take 4 tablets (1,000 mg total) by mouth  daily. Take on an empty stomach 1 hour before or 2 hours after a meal 120 tablet 6  . acetaminophen (TYLENOL) 500 MG tablet Take 500 mg by mouth every 6 (six) hours as needed for mild pain.     Marland Kitchen amitriptyline (ELAVIL) 25 MG tablet Take 25 mg by mouth at bedtime.    . Calcium Carb-Cholecalciferol 600-800 MG-UNIT TABS Take 1 capsule by mouth at bedtime.    . Cholecalciferol (VITAMIN D) 2000 UNITS CAPS Take 1 capsule by mouth daily.    . clotrimazole-betamethasone (LOTRISONE) cream     . Cranberry 200 MG CAPS Take 4,200 mg by mouth every morning.     . denosumab (XGEVA) 120 MG/1.7ML SOLN injection Inject 120 mg into the skin once.    [name] Calcium (STOOL SOFTENER PO) Take 1 tablet by mouth 2 (two) times daily.     Marland Kitchen glipiZIDE (GLUCOTROL XL) 5 MG 24 hr tablet     . glipiZIDE (GLUCOTROL) 5 MG tablet Take by mouth daily before breakfast.    . glucose blood (ACCU-CHEK ACTIVE STRIPS) test strip Use as instructed 100 each 12  . Lancets (ONETOUCH ULTRASOFT) lancets Use as instructed 100 each 12  . Leuprolide Acetate, 6 Month, (LUPRON DEPOT) 45 MG injection Inject 45 mg into the muscle every 6 (six) months.    Marland Kitchen lisinopril-hydrochlorothiazide (PRINZIDE,ZESTORETIC) 20-25 MG tablet Take 1 tablet by mouth daily. 90 tablet 3  . metFORMIN (GLUCOPHAGE) 500 MG tablet Take 500 mg by mouth 2 (two) times daily with a meal.     . nystatin (MYCOSTATIN) powder Apply topically 4 (four) times daily.    Marland Kitchen omeprazole (PRILOSEC) 20 MG capsule Take 20 mg by mouth daily.     . potassium chloride (K-DUR,KLOR-CON) 10 MEQ tablet Take 1 tablet (10 mEq total) by mouth daily. 90 tablet 3  . predniSONE (DELTASONE) 5 MG tablet Take 1 tablet (5 mg total) by mouth 2 (two) times daily with a meal. 180 tablet 0  . SENNA PO Take 1 capsule by mouth as needed.    . warfarin (COUMADIN) 2.5 MG tablet Take 2 tablets daily except 1 tablet on Mondays, Wednesdays and Fridays or as directed by Coumadin Clinic 60 tablet 3   No current  facility-administered medications on file prior to visit.    SURGICAL HISTORY: Past Surgical History  Procedure Laterality Date  . Cholecystectomy    . Incision and drainage perirectal abscess    . Portacath placement      insertion and removal -last chemotherapy 10 yrs ago  . Lymph node dissection Left     '05  . Port-a-cath removal    . Transurethral resection of prostate N/A 08/10/2013    Procedure: TRANSURETHRAL RESECTION OF THE PROSTATE WITH GYRUS INSTRUMENTS;  Surgeon: [name], MD;  Location: [facility];  Service: Urology;  Laterality: N/A;  . Cystoscopy with litholapaxy N/A 08/10/2013    Procedure: CYSTOSCOPY WITH LITHOLAPAXY WITH [name];  Surgeon: [name], MD;  Location: [facility];  Service: Urology;  Laterality: N/A;  . Cataract extraction Right 08/2013    SOCIAL HISTORY: Social History   Social History  .  Marital Status: Married    Spouse Name: N/A  . Number of Children: N/A  . Years of Education: N/A   Occupational History  . retired     Hotel manager   Social History Main Topics  . Smoking status: Never Smoker   . Smokeless tobacco: Never Used  . Alcohol Use: No  . Drug Use: No  . Sexual Activity: Not Currently   Other Topics Concern  . Not on file   Social History Narrative  He has 8 grandchildren. He takes care of his garden and mows his lawn. 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  FAMILY HISTORY: History reviewed. No pertinent family history.  Father died of pneumonia when the patient was 7 year of age Mother died of MI at 66 All of his siblings are deceased  Review of Systems  Constitutional: Positive for hot flashes. Negative for fever, chills, weight loss and malaise/fatigue. HENT: Negative for congestion, hearing loss, nosebleeds, sore throat and tinnitus.   Eyes: Negative for blurred vision, double vision, pain and discharge.  Respiratory: Negative for cough, hemoptysis, sputum production, shortness of breath and wheezing.   Cardiovascular:  Negative for chest pain, palpitations, claudication, leg swelling and PND.  Gastrointestinal: Positive for abdominal pain. Negative for heartburn, nausea, vomiting, diarrhea, constipation, blood in stool and melena.  Abdominal pain which precedes hot flashes. Genitourinary: Negative for dysuria, urgency, frequency and hematuria.  Musculoskeletal: Positive for joint pain, back pain, and hip pain. Negative for myalgias and falls.  Skin: Negative for itching and rash.  Neurological: Negative for dizziness, tingling, tremors, sensory change, speech change, focal weakness, seizures, loss of consciousness, weakness and headaches.  Endo/Heme/Allergies: Does not bruise/bleed easily.  Psychiatric/Behavioral: Negative for depression, suicidal ideas, memory loss and substance abuse. The patient is not nervous/anxious and does not have insomnia.    14 point review of systems was performed and is negative except as detailed under history of present illness and above   PHYSICAL EXAMINATION  ECOG PERFORMANCE STATUS: 1 - Symptomatic but completely ambulatory  Filed Vitals:   08/21/15 1300  BP: 129/86  Pulse: 111  Temp: 97.9 F (36.6 C)  Resp: 18    Physical Exam  Constitutional: He is oriented to person, place, and time and well-developed, well-nourished, and in no distress. Appears younger than stated age.   HENT:  Head: Normocephalic and atraumatic.  Nose: Nose normal.  Mouth/Throat: Oropharynx is clear and moist. No oropharyngeal exudate.  Eyes: Conjunctivae and EOM are normal. Pupils are equal, round, and reactive to light. Right eye exhibits no discharge. Left eye exhibits no discharge. No scleral icterus.  Neck: Normal range of motion. Neck supple. No tracheal deviation present. No thyromegaly present.  Cardiovascular: Normal rate, regular rhythm and normal heart sounds.  Exam reveals no gallop and no friction rub.   No murmur heard. Pulmonary/Chest: Effort normal and breath sounds normal.  He has no wheezes. He has no rales.  Abdominal: Soft. Bowel sounds are normal. He exhibits no distension and no mass. There is no tenderness. There is no rebound and no guarding.  Musculoskeletal: Normal range of motion. He exhibits no edema.  Lymphadenopathy:    He has no cervical adenopathy.  Neurological: He is alert and oriented to person, place, and time. He has normal reflexes. No cranial nerve deficit. Gait normal. Coordination normal.  Skin: Skin is warm and dry. No rash noted.  Psychiatric: Mood, memory, affect and judgment normal.  Nursing note and vitals reviewed.  LABORATORY DATA: I have reviewed the  labs below.  CBC    Component Value Date/Time   WBC 8.9 07/24/2015 1006   RBC 4.42 07/24/2015 1006   RBC 4.77 09/08/2013 1047   HGB 14.6 07/24/2015 1006   HCT 41.0 07/24/2015 1006   PLT 249 07/24/2015 1006   MCV 92.8 07/24/2015 1006   MCH 33.0 07/24/2015 1006   MCHC 35.6 07/24/2015 1006   RDW 13.1 07/24/2015 1006   LYMPHSABS 3.5 07/24/2015 1006   MONOABS 0.8 07/24/2015 1006   EOSABS 0.0 07/24/2015 1006   BASOSABS 0.0 07/24/2015 1006   CMP     Component Value Date/Time   NA 138 08/21/2015 1125   K 4.4 08/21/2015 1125   CL 99* 08/21/2015 1125   CO2 30 08/21/2015 1125   GLUCOSE 195* 08/21/2015 1125   BUN 24* 08/21/2015 1125   CREATININE 1.07 08/21/2015 1125   CREATININE 1.14 07/27/2013 1004   CALCIUM 9.7 08/21/2015 1125   PROT 6.8 08/21/2015 1125   ALBUMIN 3.8 08/21/2015 1125   AST 19 08/21/2015 1125   ALT 18 08/21/2015 1125   ALKPHOS 66 08/21/2015 1125   BILITOT 1.3* 08/21/2015 1125   GFRNONAA 59* 08/21/2015 1125   GFRNONAA 58* 07/27/2013 1004   GFRAA >60 08/21/2015 1125   GFRAA 66 07/27/2013 1004   Results for VLADIMIR, MICHONSKI (MRN [id number]) as of 09/07/2015 16:12  Ref. Range 01/09/2015 10:23 01/25/2015 10:18 03/06/2015 10:52 04/02/2015 10:45 07/24/2015 10:06  PSA Latest Ref Range: 0.00-4.00 ng/mL 12.53 (H) 12.22 (H) 0.27 0.03 <0.01    RADIOLOGY: I have  personally reviewed the radiological images as listed and agreed with the findings in the report.  Study Result     CLINICAL DATA: Right leg pain x3 weeks, edema. On warfarin.  EXAM: RIGHT LOWER EXTREMITY VENOUS DOPPLER ULTRASOUND  TECHNIQUE: Gray-scale sonography with compression, as well as color and duplex ultrasound, were performed to evaluate the deep venous system from the level of the common femoral vein through the popliteal and proximal calf veins.  COMPARISON: None  FINDINGS: Normal compressibility of the common femoral, superficial femoral, and popliteal veins, as well as the proximal calf veins. No filling defects to suggest DVT on grayscale or color Doppler imaging. Doppler waveforms show normal direction of venous flow, normal respiratory phasicity and response to augmentation. Chronic calcifications in the greater saphenous vein Below the knee. Survey views of the contralateral common femoral vein are unremarkable.  IMPRESSION: 1. No evidence of lower extremity deep vein thrombosis, RIGHT. 2. 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   Electronically Signed  By: Lucrezia Europe M.D.  On: 08/13/2015 10:40   Study Result     CLINICAL DATA: Low back pain, bilateral hip pain for several weeks. No known injury. Stage IV prostate Foster.  EXAM: LUMBAR SPINE - COMPLETE 4+ VIEW  COMPARISON: CT 10/05/2014  FINDINGS: Degenerative disc disease and facet disease diffusely throughout the lumbar spine. Disc space narrowing, vacuum disc and spurring, most pronounced from L2-3 through L5-S1. No fracture or subluxation. SI joints are symmetric and unremarkable. Aortic calcifications without visible aneurysm.  IMPRESSION: Diffuse degenerative changes. No acute findings.   Electronically Signed  By: Rolm Baptise M.D.  On: 08/13/2015 10:16   Study Result     CLINICAL DATA: Low back pain,  bilateral hip pain for several weeks.  EXAM: SACRUM AND COCCYX - 2+ VIEW  COMPARISON: None.  FINDINGS: Degenerative changes in the visualized lower lumbar spine. Area of sclerosis noted in the right sacrum, corresponding to  abnormality seen on prior CT and MRI, presumably known bone metastasis. No additional focal bony abnormality or acute bony abnormality.  IMPRESSION: Area of sclerosis within the right sacrum corresponding to sclerotic area on prior CT and MRI, likely metastasis.   Electronically Signed  By: Rolm Baptise M.D.  On: 08/13/2015 10:18   Study Result     CLINICAL DATA: Low back pain, bilateral hip pain for several weeks. No known injury. Stage IV prostate Foster.  EXAM: DG HIP (WITH OR WITHOUT PELVIS) 3-4V BILAT  COMPARISON: CT 10/05/2014. MRI 03/05/2014.  FINDINGS: Subtle areas close is within the right sacral ala, likely corresponding to sclerotic area on prior CT and MRI and representing bone metastasis. No additional suspicious focal bony abnormality. No acute bony abnormality. Specifically, no fracture, subluxation, or dislocation. Soft tissues are intact. Early degenerative changes in the hips bilaterally. Degenerative changes in the visualized lower lumbar spine.  IMPRESSION: Subtle area of sclerosis within the right sacral ala, likely corresponding to metastasis seen on prior CT and MRI.  No additional acute or focal bony abnormality.   Electronically Signed  By: Rolm Baptise M.D.  On: 08/13/2015 10:19     ASSESSMENT and THERAPY PLAN:  Stage IV adenocarcinoma of the prostate Lupron therapy/Casodex Rising PSA with value of 3.11 ng/ml on 09/10/2014  DEXA with normal bone density XGEVA monthly Casodex discontinued with continued rise of PSA Zytiga 01/2015   The patient is here to receive his Delton See today. He is overall better from our last visit but continues to complain of back/hip pain.  I have ordered a bone  scan and will notify the patient of results.   He will return in 1 month for follow up pending the results of imaging.   All questions were answered. The patient knows to call the clinic with any problems, questions or concerns. We can certainly see the patient much sooner if necessary.   This document serves as a record of services personally performed by Ancil Linsey, MD. It was created on her behalf by Arlyce Harman, a trained medical scribe. The creation of this record is based on the scribe's personal observations and the provider's statements to them. This document has been checked and approved by the attending provider.  I have reviewed the above documentation for accuracy and completeness, and I agree with the above.  This note was electronically signed.  Kelby Fam. Penland MD

## 2015-08-21 NOTE — Telephone Encounter (Signed)
Has been taking Diltiazem ER 180mg  daily.  Went to pick up Rx today from [facility].  Cost has gone up from $11 to $55 for 3 month Rx.  Pt wants to know if there is something he can take that is cheaper

## 2015-08-21 NOTE — Patient Instructions (Addendum)
[place] [address] at [facility] Discharge Instructions  RECOMMENDATIONS MADE BY THE CONSULTANT AND ANY TEST RESULTS WILL BE SENT TO YOUR REFERRING PHYSICIAN.   Exam and discussion by Dr Whitney Muse today Jayme Cloud today X-geva monthly  Lupron as scheduled Bone scan Return to see the doctor in 1 month Please call the clinic if you have any questions or concerns     Thank you for choosing [place] at [facility] to provide your oncology and hematology care.  To afford each patient quality time with our provider, please arrive at least 15 minutes before your scheduled appointment time.   Beginning January 23rd 2017 lab work for the [name] will be done in the  Main lab at [facility] on 1st floor. If you have a lab appointment with the [facility] please come in thru the  Main Entrance and check in at the main information desk  You need to re-schedule your appointment should you arrive 10 or more minutes late.  We strive to give you quality time with our providers, and arriving late affects you and other patients whose appointments are after yours.  Also, if you no show three or more times for appointments you may be dismissed from the clinic at the providers discretion.     Again, thank you for choosing [facility].  Our hope is that these requests will decrease the amount of time that you wait before being seen by our physicians.       _____________________________________________________________  Should you have questions after your visit to [facility], please contact our office at [phone] between the hours of 8:30 a.m. and 4:30 p.m.  Voicemails left after 4:30 p.m. will not be returned until the following business day.  For prescription refill requests, have your pharmacy contact our office.         Resources For Cancer Patients and their Caregivers ? American Cancer Society: Can assist with  transportation, wigs, general needs, runs Look Good Feel Better.        (984)186-6779 ? Cancer Care: Provides financial assistance, online support groups, medication/co-pay assistance.  1-800-813-HOPE 808-387-9298) ? [place] Assists [place] cancer patients and their families through emotional , educational and financial support.  936 036 0301 ? [place] DSS Where to apply for food stamps, Medicaid and utility assistance. 908-087-9748 ? RCATS: Transportation to medical appointments. (445)739-6390 ? Social Security Administration: May apply for disability if have a Stage IV cancer. (321)175-5602 9866938507 ? [employer], Disability and Transit Services: Assists with nutrition, care and transit needs. 534-225-6429

## 2015-08-21 NOTE — Progress Notes (Signed)
[name] presents today for injection per the provider's orders.  X-geva  administration without incident; see MAR for injection details.  Patient tolerated procedure well and without incident.  No questions or complaints noted at this time.

## 2015-08-21 NOTE — Telephone Encounter (Signed)
Needs refill on Diltiazem sent to [facility] / [facility]

## 2015-08-22 MED ORDER — DILTIAZEM HCL 90 MG PO TABS: 90.0000 mg | ORAL_TABLET | Freq: Two times a day (BID) | ORAL | Status: DC

## 2015-08-22 NOTE — Telephone Encounter (Signed)
Can do short acting diltiazem 90mg  bid  Zandra Abts MD

## 2015-08-22 NOTE — Telephone Encounter (Signed)
Made medication change. Sent in RX  And called pt to let him know of the change.

## 2015-08-25 ENCOUNTER — Encounter (HOSPITAL_COMMUNITY): Payer: Self-pay | Admitting: Hematology & Oncology

## 2015-08-26 ENCOUNTER — Encounter (HOSPITAL_COMMUNITY)
Admission: RE | Admit: 2015-08-26 | Discharge: 2015-08-26 | Disposition: A | Discharge status: Home / Self Care | Payer: Medicare Other | Source: Ambulatory Visit | Attending: Hematology & Oncology | Admitting: Hematology & Oncology

## 2015-08-26 ENCOUNTER — Encounter (HOSPITAL_COMMUNITY): Payer: Self-pay

## 2015-08-26 DIAGNOSIS — M25552 Pain in left hip: Secondary | ICD-10-CM | POA: Insufficient documentation

## 2015-08-26 DIAGNOSIS — C7951 Secondary malignant neoplasm of bone: Secondary | ICD-10-CM | POA: Diagnosis not present

## 2015-08-26 DIAGNOSIS — C61 Malignant neoplasm of prostate: Secondary | ICD-10-CM | POA: Diagnosis present

## 2015-08-26 MED ORDER — TECHNETIUM TC 99M MEDRONATE IV KIT
25.0000 | PACK | Freq: Once | INTRAVENOUS | Status: AC | PRN
  Administered 2015-08-26: 25.1 via INTRAVENOUS

## 2015-09-06 DIAGNOSIS — E1165 Type 2 diabetes mellitus with hyperglycemia: Secondary | ICD-10-CM | POA: Diagnosis not present

## 2015-09-06 DIAGNOSIS — I482 Chronic atrial fibrillation: Secondary | ICD-10-CM | POA: Diagnosis not present

## 2015-09-06 DIAGNOSIS — J209 Acute bronchitis, unspecified: Secondary | ICD-10-CM | POA: Diagnosis not present

## 2015-09-06 DIAGNOSIS — I10 Essential (primary) hypertension: Secondary | ICD-10-CM | POA: Diagnosis not present

## 2015-09-07 ENCOUNTER — Encounter (HOSPITAL_COMMUNITY): Payer: Self-pay | Admitting: Hematology & Oncology

## 2015-09-10 ENCOUNTER — Other Ambulatory Visit (HOSPITAL_COMMUNITY): Payer: Self-pay | Admitting: Oncology

## 2015-09-10 ENCOUNTER — Telehealth (HOSPITAL_COMMUNITY): Payer: Self-pay | Admitting: *Deleted

## 2015-09-10 ENCOUNTER — Ambulatory Visit (HOSPITAL_COMMUNITY): Payer: Medicare Other

## 2015-09-10 DIAGNOSIS — R21 Rash and other nonspecific skin eruption: Secondary | ICD-10-CM

## 2015-09-10 MED ORDER — FLUCONAZOLE 100 MG PO TABS: 100.0000 mg | ORAL_TABLET | Freq: Every day | ORAL | Status: AC

## 2015-09-10 NOTE — Telephone Encounter (Signed)
Pt's wife notified. She said that the fungus didn't start until the Zytiga.

## 2015-09-10 NOTE — Telephone Encounter (Signed)
In basket message sent to Edrick Oh in the Coumadin Clinic letting her know that we started [name] on 100mg  of Diflucan x 7 days. His pharmacy called to see if they wanted [place] to adjust his Coumadin. We will pass this message along to the Coumadin Clinic nurse to see what her recommendation is.

## 2015-09-10 NOTE — Telephone Encounter (Signed)
Fungus is back to feet and buttock area. Pt originally went to Urgent Care when in [place] for this. He was given a pill by mouth and a cream (clotrimazole-betamethasone). His feet are sore. Dr. Whitney Muse told him to notify us if it reoccurred. He wants this prescribed again to [facility] in [place].

## 2015-09-10 NOTE — Telephone Encounter (Signed)
Discussed with Dr. Whitney Muse.  Will escribe Diflucan x 7 days.  Refer to Dr. Nevada Crane (Dermatology for further evaluation).  TK

## 2015-09-11 ENCOUNTER — Encounter (HOSPITAL_BASED_OUTPATIENT_CLINIC_OR_DEPARTMENT_OTHER): Payer: Medicare Other

## 2015-09-11 ENCOUNTER — Ambulatory Visit (INDEPENDENT_AMBULATORY_CARE_PROVIDER_SITE_OTHER): Payer: Medicare Other | Admitting: *Deleted

## 2015-09-11 ENCOUNTER — Encounter (HOSPITAL_COMMUNITY): Payer: Self-pay

## 2015-09-11 VITALS — BP 129/80 | HR 90 | Temp 98.0°F | Resp 18

## 2015-09-11 DIAGNOSIS — C61 Malignant neoplasm of prostate: Secondary | ICD-10-CM | POA: Diagnosis not present

## 2015-09-11 DIAGNOSIS — Z5111 Encounter for antineoplastic chemotherapy: Secondary | ICD-10-CM

## 2015-09-11 DIAGNOSIS — I4891 Unspecified atrial fibrillation: Secondary | ICD-10-CM

## 2015-09-11 DIAGNOSIS — C7951 Secondary malignant neoplasm of bone: Secondary | ICD-10-CM

## 2015-09-11 DIAGNOSIS — Z5181 Encounter for therapeutic drug level monitoring: Secondary | ICD-10-CM

## 2015-09-11 DIAGNOSIS — Z7901 Long term (current) use of anticoagulants: Secondary | ICD-10-CM | POA: Diagnosis not present

## 2015-09-11 LAB — POCT INR: INR: 2.2

## 2015-09-11 MED ORDER — LEUPROLIDE ACETATE (3 MONTH) 22.5 MG IM KIT
45.0000 mg | PACK | Freq: Once | INTRAMUSCULAR | Status: AC
  Administered 2015-09-11: 45 mg via INTRAMUSCULAR
  Filled 2015-09-11: qty 45

## 2015-09-11 NOTE — Patient Instructions (Signed)
[place] at [name] Discharge Instructions  RECOMMENDATIONS MADE BY THE CONSULTANT AND ANY TEST RESULTS WILL BE SENT TO YOUR REFERRING PHYSICIAN.  Lupron injection today. Return as scheduled next week for Xgeva injection. Return as scheduled for lab work and office visit.   Thank you for choosing [place] at [facility] to provide your oncology and hematology care.  To afford each patient quality time with our provider, please arrive at least 15 minutes before your scheduled appointment time.   Beginning January 23rd 2017 lab work for the [name] will be done in the  Main lab at [facility] on 1st floor. If you have a lab appointment with the [place] [facility] please come in thru the  Main Entrance and check in at the main information desk  You need to re-schedule your appointment should you arrive 10 or more minutes late.  We strive to give you quality time with our providers, and arriving late affects you and other patients whose appointments are after yours.  Also, if you no show three or more times for appointments you may be dismissed from the clinic at the providers discretion.     Again, thank you for choosing [facility].  Our hope is that these requests will decrease the amount of time that you wait before being seen by our physicians.       _____________________________________________________________  Should you have questions after your visit to [facility], please contact our office at [phone] between the hours of 8:30 a.m. and 4:30 p.m.  Voicemails left after 4:30 p.m. will not be returned until the following business day.  For prescription refill requests, have your pharmacy contact our office.         Resources For Cancer Patients and their Caregivers ? American Cancer Society: Can assist with transportation, wigs, general needs, runs Look Good Feel Better.         (463)358-9789 ? Cancer Care: Provides financial assistance, online support groups, medication/co-pay assistance.  1-800-813-HOPE 8128003809) ? [place] Assists [place] cancer patients and their families through emotional , educational and financial support.  (901)463-1524 ? [place] DSS Where to apply for food stamps, Medicaid and utility assistance. 306-810-7423 ? RCATS: Transportation to medical appointments. 765-548-5901 ? Social Security Administration: May apply for disability if have a Stage IV cancer. 513-283-6103 365-241-6798 ? [employer], Disability and Transit Services: Assists with nutrition, care and transit needs. [place] Support Programs: @10RELATIVEDAYS @ > Cancer Support Group  2nd Tuesday of the month 1pm-2pm, Journey Room  > Creative Journey  3rd Tuesday of the month 1130am-1pm, Journey Room  > Look Good Feel Better  1st Wednesday of the month 10am-12 noon, Journey Room (Call [place] to register [phone])

## 2015-09-11 NOTE — Progress Notes (Signed)
[name] presents today for injection per the provider's orders.  Lupron administration without incident; see MAR for injection details.  Patient tolerated procedure well and without incident.  No questions or complaints noted at this time.

## 2015-09-12 ENCOUNTER — Other Ambulatory Visit (HOSPITAL_COMMUNITY): Payer: Self-pay | Admitting: Oncology

## 2015-09-12 NOTE — Addendum Note (Signed)
Addended by: Pricilla Larsson on: 09/12/2015 08:37 AM   Modules accepted: Orders

## 2015-09-13 DIAGNOSIS — J209 Acute bronchitis, unspecified: Secondary | ICD-10-CM | POA: Diagnosis not present

## 2015-09-13 DIAGNOSIS — E1165 Type 2 diabetes mellitus with hyperglycemia: Secondary | ICD-10-CM | POA: Diagnosis not present

## 2015-09-13 DIAGNOSIS — J309 Allergic rhinitis, unspecified: Secondary | ICD-10-CM | POA: Diagnosis not present

## 2015-09-18 ENCOUNTER — Ambulatory Visit (HOSPITAL_COMMUNITY): Payer: Medicare Other | Admitting: Hematology & Oncology

## 2015-09-18 ENCOUNTER — Encounter (HOSPITAL_COMMUNITY): Payer: Medicare Other

## 2015-09-18 ENCOUNTER — Encounter (HOSPITAL_BASED_OUTPATIENT_CLINIC_OR_DEPARTMENT_OTHER): Payer: Medicare Other

## 2015-09-18 ENCOUNTER — Other Ambulatory Visit (HOSPITAL_COMMUNITY): Payer: Medicare Other

## 2015-09-18 VITALS — BP 135/88 | HR 87 | Temp 97.9°F | Resp 18

## 2015-09-18 DIAGNOSIS — C7951 Secondary malignant neoplasm of bone: Secondary | ICD-10-CM

## 2015-09-18 DIAGNOSIS — C61 Malignant neoplasm of prostate: Secondary | ICD-10-CM | POA: Diagnosis not present

## 2015-09-18 DIAGNOSIS — M1990 Unspecified osteoarthritis, unspecified site: Secondary | ICD-10-CM | POA: Diagnosis not present

## 2015-09-18 DIAGNOSIS — M25551 Pain in right hip: Secondary | ICD-10-CM | POA: Diagnosis not present

## 2015-09-18 DIAGNOSIS — M545 Low back pain: Secondary | ICD-10-CM | POA: Diagnosis not present

## 2015-09-18 DIAGNOSIS — M79606 Pain in leg, unspecified: Secondary | ICD-10-CM | POA: Diagnosis not present

## 2015-09-18 LAB — COMPREHENSIVE METABOLIC PANEL
ALT: 23 U/L (ref 17–63)
AST: 21 U/L (ref 15–41)
Albumin: 3.8 g/dL (ref 3.5–5.0)
Alkaline Phosphatase: 73 U/L (ref 38–126)
Anion gap: 9 (ref 5–15)
BUN: 20 mg/dL (ref 6–20)
CO2: 29 mmol/L (ref 22–32)
Calcium: 9.6 mg/dL (ref 8.9–10.3)
Chloride: 97 mmol/L — ABNORMAL LOW (ref 101–111)
Creatinine, Ser: 0.87 mg/dL (ref 0.61–1.24)
GFR calc Af Amer: >60 mL/min (ref >60)
GFR calc non Af Amer: >60 mL/min (ref >60)
Glucose, Bld: 154 mg/dL — ABNORMAL HIGH (ref 65–99)
Potassium: 3.8 mmol/L (ref 3.5–5.1)
Sodium: 135 mmol/L (ref 135–145)
Total Bilirubin: 0.9 mg/dL (ref 0.3–1.2)
Total Protein: 6.9 g/dL (ref 6.5–8.1)

## 2015-09-18 MED ORDER — DENOSUMAB 120 MG/1.7ML ~~LOC~~ SOLN
120.0000 mg | Freq: Once | SUBCUTANEOUS | Status: AC
  Administered 2015-09-18: 120 mg via SUBCUTANEOUS
  Filled 2015-09-18: qty 1.7

## 2015-09-18 NOTE — Patient Instructions (Signed)
[place] at [facility] Discharge Instructions  RECOMMENDATIONS MADE BY THE CONSULTANT AND ANY TEST RESULTS WILL BE SENT TO YOUR REFERRING PHYSICIAN.  Xgeva injection today. Return as scheduled for injections and lab work. Return as scheduled for office visit.   Thank you for choosing [place] at [facility] to provide your oncology and hematology care.  To afford each patient quality time with our provider, please arrive at least 15 minutes before your scheduled appointment time.   Beginning January 23rd 2017 lab work for the [name] will be done in the  Main lab at [facility] on 1st floor. If you have a lab appointment with the [facility] please come in thru the  Main Entrance and check in at the main information desk  You need to re-schedule your appointment should you arrive 10 or more minutes late.  We strive to give you quality time with our providers, and arriving late affects you and other patients whose appointments are after yours.  Also, if you no show three or more times for appointments you may be dismissed from the clinic at the providers discretion.     Again, thank you for choosing [facility].  Our hope is that these requests will decrease the amount of time that you wait before being seen by our physicians.       _____________________________________________________________  Should you have questions after your visit to [place], please contact our office at [phone] between the hours of 8:30 a.m. and 4:30 p.m.  Voicemails left after 4:30 p.m. will not be returned until the following business day.  For prescription refill requests, have your pharmacy contact our office.         Resources For Cancer Patients and their Caregivers ? American Cancer Society: Can assist with transportation, wigs, general needs, runs Look Good Feel Better.        825 581 3403 ? Cancer  Care: Provides financial assistance, online support groups, medication/co-pay assistance.  1-800-813-HOPE (314)134-8794) ? [place] Assists [place] cancer patients and their families through emotional , educational and financial support.  847-472-7357 ? [place] DSS Where to apply for food stamps, Medicaid and utility assistance. 3523899372 ? RCATS: Transportation to medical appointments. 2158617331 ? Social Security Administration: May apply for disability if have a Stage IV cancer. [phone] 5186263505 ? [employer], Disability and Transit Services: Assists with nutrition, care and transit needs. [place] Support Programs: @10RELATIVEDAYS @ > Cancer Support Group  2nd Tuesday of the month 1pm-2pm, Journey Room  > Creative Journey  3rd Tuesday of the month 1130am-1pm, Journey Room  > Look Good Feel Better  1st Wednesday of the month 10am-12 noon, Journey Room (Call [place] to register [phone])

## 2015-09-22 NOTE — Progress Notes (Signed)
This encounter was created in error - please disregard.

## 2015-10-01 ENCOUNTER — Encounter (HOSPITAL_COMMUNITY): Payer: Self-pay | Admitting: Oncology

## 2015-10-01 ENCOUNTER — Encounter (HOSPITAL_COMMUNITY): Payer: Medicare Other | Attending: Oncology | Admitting: Oncology

## 2015-10-01 VITALS — BP 146/78 | HR 100 | Temp 97.7°F | Resp 18 | Wt 187.0 lb

## 2015-10-01 DIAGNOSIS — C8337 Diffuse large B-cell lymphoma, spleen: Secondary | ICD-10-CM | POA: Diagnosis not present

## 2015-10-01 DIAGNOSIS — M1990 Unspecified osteoarthritis, unspecified site: Secondary | ICD-10-CM | POA: Insufficient documentation

## 2015-10-01 DIAGNOSIS — C7951 Secondary malignant neoplasm of bone: Secondary | ICD-10-CM | POA: Diagnosis not present

## 2015-10-01 DIAGNOSIS — C61 Malignant neoplasm of prostate: Secondary | ICD-10-CM | POA: Diagnosis not present

## 2015-10-01 DIAGNOSIS — M25551 Pain in right hip: Secondary | ICD-10-CM | POA: Insufficient documentation

## 2015-10-01 DIAGNOSIS — M79606 Pain in leg, unspecified: Secondary | ICD-10-CM | POA: Insufficient documentation

## 2015-10-01 DIAGNOSIS — M545 Low back pain: Secondary | ICD-10-CM | POA: Insufficient documentation

## 2015-10-01 NOTE — Assessment & Plan Note (Addendum)
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 Began Zytiga with prednisone BID on 01/19/2015.  Remains on Depo-Lupron every 6 months and Xgeva every month.  Delton See is due in ~ 2 weeks.  Last given on:  Oncology Flowsheet 09/18/2015  denosumab (XGEVA) [place] 120 mg   Depo-Lupron is every 6 months.  It was last given on:  Oncology Flowsheet 09/11/2015  leuprolide (LUPRON) IM 45 mg   I personally reviewed and went over radiographic studies with the patient.  The results are noted within this dictation.  Bone scan on 08/26/15 demonstrated stable and improved osseous disease from prostate cancer.  Labs in 2 weeks: CBC diff, CMET, PSA, testosterone.   Xgeva in 2 weeks.  Labs in 6 weeks: CBC diff, CMET, PSA.  Xgeva in ~ 6 weeks.  He notes symptoms of orthostatic hypotension at home.  He reports that he is drinking H2O.  He is encouraged to be mindful of this and make sure that he pauses following postural changes to provide adequate time for acclimation.  BP today in the clinic is stable.  He notes that his "right knee" gives out.  He notes that when he gets down low (ie: cleaning his white wall tires or fixing something low) he has a difficult time getting back up.  He had one fall approximately 6 weeks when he was looking up while hanging a new flag on his flag pole.  He notes that he fell backwards while looking up.  He had a difficult time getting back up.  He does have a right facial cheek lesion that is about 2-3 mm in size protruding from the face of the skin.  I have recommended that the patient see his dermatologist for this (Dr. Nevada Crane- dermatology).    He is established with an orthopod and he is welcome to follow-up with him regarding hsi right knee.  He declines a plain film of his right leg today.  Return in 4-6 weeks for follow-up.

## 2015-10-01 NOTE — Progress Notes (Signed)
[place], [place] [address] [place] [address]  Prostate cancer [facility]) - Plan: CBC with Differential, PSA, Testosterone, Testosterone, free, Testosterone, % free, CBC with Differential, Comprehensive metabolic panel, PSA  CURRENT THERAPY: Zytiga/Prednisone, Depo-Lupron every 6 months, and Xgeva monthly.  INTERVAL HISTORY: [name] 82 y.o. male returns for followup of Stage IV, castrate resistant, prostate cancer with osseous involvement under active treatment. He continues on Zytiga/Prednision, XGEVA and Lupron. 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 Margot Chimes in October 2005  AND Diffuse large B-cell lymphoma clinically stage IIIAS STATUS post cervical lymph node biopsy on 08/03/2003. His lymphoma CD20 positive and PET scan showed positivity in the spleen. Bone marrow aspiration and biopsy were negative however. He received R. CHOP x6 cycles with a complete remission established by PET scan criteria on 11/23/2003. He completed 6 full cycles total. He remains without disease recurrence. He remains free of B. symptomatology.   He notes a few concerns:  1. Dizziness with postural changes.  2. Right knee/leg pain  3. Right cheek lesion  He reports that he fell ~ 6 weeks ago while looking upwards raising a new flag on his flag poll.  He lost his balance and fell backwards.  He had difficulty getting back on his feet and used his flag pole to support his weight.    He is unable to get up from a low position, for example cleaning his white wall tires and working on his motor home.  He notes that when he gets low, he has difficult time raising.  "My right leg will just give out."  He does have a right cheek skin lesion.  He is concerned about his dizziness and recent fall.  This is likely multifactorial.  Review of Systems  Constitutional: Negative.     HENT: Negative.   Eyes: Negative.   Respiratory: Negative.   Cardiovascular: Negative.   Gastrointestinal: Negative.   Genitourinary: Negative.   Musculoskeletal: Positive for falls.  Skin: Negative.   Neurological: Positive for dizziness.  Endo/Heme/Allergies: Negative.     Past Medical History  Diagnosis Date  . Hypertension   . Atrial fibrillation ([place])     Dr. Harl Bowie- [facility] follows saw 11'14  . GIST (gastrointestinal stromal tumor), malignant ([place]) 08/18/2011    Gastrointestinal stromal tumor, that is GIST, small bowel, 4.5 cm, intermediate prognostic grade found on the PET scan in the small bowel, accounting for that small bowel activity in October 2005 with resection by Dr. Margot Chimes, thus far without recurrence.   . NHL (non-Hodgkin's lymphoma) ([place]) 08/18/2011    Diffuse large B-cell lymphoma, clinically stage IIIA, CD20 positive, status post cervical lymph node biopsy 08/03/2003 on the left. PET scan was also positive in the spleen and small bowel region, but bone marrow aspiration and biopsy were negative. So he essentially had stage IIIAs. He received R-CHOP x6 cycles with CR established by PET scan criteria on 11/23/2003 with no evidence for relapse th  . Cataracts, both eyes     surgery planned May 2015  . Bladder stones     tx. with oral meds and antibiotics, now surgery planned  . Diabetes mellitus without complication [facility])     Past Surgical History  Procedure Laterality Date  . Cholecystectomy    . Incision and drainage perirectal abscess    . Portacath placement  insertion and removal -last chemotherapy 10 yrs ago  . Lymph node dissection Left     '05  . Port-a-cath removal    . Transurethral resection of prostate N/A 08/10/2013    Procedure: TRANSURETHRAL RESECTION OF THE PROSTATE WITH GYRUS INSTRUMENTS;  Surgeon: [name], MD;  Location: [facility];  Service: Urology;  Laterality: N/A;  . Cystoscopy with litholapaxy N/A 08/10/2013    Procedure:  CYSTOSCOPY WITH LITHOLAPAXY WITH [name];  Surgeon: [name], MD;  Location: [facility];  Service: Urology;  Laterality: N/A;  . Cataract extraction Right 08/2013    History reviewed. No pertinent family history.  Social History   Social History  . Marital Status: Married    Spouse Name: N/A  . Number of Children: N/A  . Years of Education: N/A   Occupational History  . retired     Hotel manager   Social History Main Topics  . Smoking status: Never Smoker   . Smokeless tobacco: Never Used  . Alcohol Use: No  . Drug Use: No  . Sexual Activity: Not Currently   Other Topics Concern  . None   Social History Narrative     PHYSICAL EXAMINATION  ECOG PERFORMANCE STATUS: 1 - Symptomatic but completely ambulatory  Filed Vitals:   10/01/15 1431  BP: 146/78  Pulse: 100  Temp: 97.7 F (36.5 C)  Resp: 18    GENERAL:alert, no distress, well nourished, well developed, comfortable, cooperative, smiling and unaccompanied SKIN: skin color, texture, turgor are normal HEAD: Normocephalic, No masses, lesions, tenderness or abnormalities EYES: normal, Conjunctiva are pink and non-injected EARS: External ears normal OROPHARYNX:lips, buccal mucosa, and tongue normal and mucous membranes are moist  NECK: supple, trachea midline LYMPH:  no palpable lymphadenopathy BREAST:not examined LUNGS: clear to auscultation  HEART: irregularly irregular, S1 normal and S2 normal ABDOMEN:abdomen soft and normal bowel sounds BACK: Back symmetric, no curvature. EXTREMITIES:less then 2 second capillary refill, no joint deformities, effusion, or inflammation, no skin discoloration, no cyanosis  NEURO: alert & oriented x 3 with fluent speech, no focal motor/sensory deficits, gait normal   LABORATORY DATA: CBC    Component Value Date/Time   WBC 8.9 07/24/2015 1006   RBC 4.42 07/24/2015 1006   RBC 4.77 09/08/2013 1047   HGB 14.6 07/24/2015 1006   HCT 41.0 07/24/2015 1006   PLT 249  07/24/2015 1006   MCV 92.8 07/24/2015 1006   MCH 33.0 07/24/2015 1006   MCHC 35.6 07/24/2015 1006   RDW 13.1 07/24/2015 1006   LYMPHSABS 3.5 07/24/2015 1006   MONOABS 0.8 07/24/2015 1006   EOSABS 0.0 07/24/2015 1006   BASOSABS 0.0 07/24/2015 1006      Chemistry      Component Value Date/Time   NA 135 09/18/2015 1338   K 3.8 09/18/2015 1338   CL 97* 09/18/2015 1338   CO2 29 09/18/2015 1338   BUN 20 09/18/2015 1338   CREATININE 0.87 09/18/2015 1338   CREATININE 1.14 07/27/2013 1004      Component Value Date/Time   CALCIUM 9.6 09/18/2015 1338   ALKPHOS 73 09/18/2015 1338   AST 21 09/18/2015 1338   ALT 23 09/18/2015 1338   BILITOT 0.9 09/18/2015 1338     Lab Results  Component Value Date   PSA <0.01 07/24/2015   PSA 0.03 04/02/2015   PSA 0.27 03/06/2015     PENDING LABS:   RADIOGRAPHIC STUDIES:  No results found.   PATHOLOGY:    ASSESSMENT AND PLAN:  Prostate cancer ([place]) Stage IV  stage IV prostate cancer with bone metastases. Began Zytiga with prednisone BID on 01/19/2015.  Remains on Depo-Lupron every 6 months and Xgeva every month.  Delton See is due in ~ 2 weeks.  Last given on:  Oncology Flowsheet 09/18/2015  denosumab (XGEVA) [place] 120 mg   Depo-Lupron is every 6 months.  It was last given on:  Oncology Flowsheet 09/11/2015  leuprolide (LUPRON) IM 45 mg   I personally reviewed and went over radiographic studies with the patient.  The results are noted within this dictation.  Bone scan on 08/26/15 demonstrated stable and improved osseous disease from prostate cancer.  Labs in 2 weeks: CBC diff, CMET, PSA, testosterone.   Xgeva in 2 weeks.  Labs in 6 weeks: CBC diff, CMET, PSA.  Xgeva in ~ 6 weeks.  He notes symptoms of orthostatic hypotension at home.  He reports that he is drinking H2O.  He is encouraged to be mindful of this and make sure that he pauses following postural changes to provide adequate time for acclimation.  BP today in the clinic is  stable.  He notes that his "right knee" gives out.  He notes that when he gets down low (ie: cleaning his white wall tires or fixing something low) he has a difficult time getting back up.  He had one fall approximately 6 weeks when he was looking up while hanging a new flag on his flag pole.  He notes that he fell backwards while looking up.  He had a difficult time getting back up.  He does have a right facial cheek lesion that is about 2-3 mm in size protruding from the face of the skin.  I have recommended that the patient see his dermatologist for this (Dr. Nevada Crane- dermatology).    He is established with an orthopod and he is welcome to follow-up with him regarding hsi right knee.  He declines a plain film of his right leg today.  Return in 4-6 weeks for follow-up.    ORDERS PLACED FOR THIS ENCOUNTER: Orders Placed This Encounter  Procedures  . CBC with Differential  . PSA  . Testosterone  . Testosterone, free  . Testosterone, % free  . CBC with Differential  . Comprehensive metabolic panel  . PSA    MEDICATIONS PRESCRIBED THIS ENCOUNTER: No orders of the defined types were placed in this encounter.    THERAPY PLAN:  Will continue with treatment as outlined.  All questions were answered. The patient knows to call the clinic with any problems, questions or concerns. We can certainly see the patient much sooner if necessary.  Patient and plan discussed with Dr. Ancil Linsey and she is in agreement with the aforementioned.   This note is electronically signed by: Doy Mince 10/01/2015 4:43 PM

## 2015-10-01 NOTE — Patient Instructions (Addendum)
[place] at [facility] Discharge Instructions  RECOMMENDATIONS MADE BY THE CONSULTANT AND ANY TEST RESULTS WILL BE SENT TO YOUR REFERRING PHYSICIAN.  Exam done and seen today by [name] Educated patient on orthostatic hypotension Call dr. Nevada Crane and make appointment regarding right fascial cheek lesion. Call your ortho doc for your right knee pain Labs in 2 weeks, 6 weeks Xgeva in 2 weeks, 6 weeks Return to see the Doctor in 6 weeks. Call the clinic for any concerns or questions.         Thank you for choosing [place] at [facility] to provide your oncology and hematology care.  To afford each patient quality time with our provider, please arrive at least 15 minutes before your scheduled appointment time.   Beginning January 23rd 2017 lab work for the [name] will be done in the  Main lab at [facility] on 1st floor. If you have a lab appointment with the [place] please come in thru the  Main Entrance and check in at the main information desk  You need to re-schedule your appointment should you arrive 10 or more minutes late.  We strive to give you quality time with our providers, and arriving late affects you and other patients whose appointments are after yours.  Also, if you no show three or more times for appointments you may be dismissed from the clinic at the providers discretion.     Again, thank you for choosing [facility].  Our hope is that these requests will decrease the amount of time that you wait before being seen by our physicians.       _____________________________________________________________  Should you have questions after your visit to [facility], please contact our office at [phone] between the hours of 8:30 a.m. and 4:30 p.m.  Voicemails left after 4:30 p.m. will not be returned until the following business day.  For prescription refill requests, have your  pharmacy contact our office.         Resources For Cancer Patients and their Caregivers ? American Cancer Society: Can assist with transportation, wigs, general needs, runs Look Good Feel Better.        [phone] ? Cancer Care: Provides financial assistance, online support groups, medication/co-pay assistance.  1-800-813-HOPE 309-706-8433) ? [facility] Assists [place] cancer patients and their families through emotional , educational and financial support.  431-770-7596 ? [place] DSS Where to apply for food stamps, Medicaid and utility assistance. 240-092-7652 ? RCATS: Transportation to medical appointments. 5397453880 ? Social Security Administration: May apply for disability if have a Stage IV cancer. 435-774-5510 (857) 312-4569 ? [employer], Disability and Transit Services: Assists with nutrition, care and transit needs. [place] Support Programs: @10RELATIVEDAYS @ > Cancer Support Group  2nd Tuesday of the month 1pm-2pm, Journey Room  > Creative Journey  3rd Tuesday of the month 1130am-1pm, Journey Room  > Look Good Feel Better  1st Wednesday of the month 10am-12 noon, Journey Room (Call [place] to register [phone])

## 2015-10-04 DIAGNOSIS — M25561 Pain in right knee: Secondary | ICD-10-CM | POA: Diagnosis not present

## 2015-10-04 DIAGNOSIS — M545 Low back pain: Secondary | ICD-10-CM | POA: Diagnosis not present

## 2015-10-08 DIAGNOSIS — B351 Tinea unguium: Secondary | ICD-10-CM | POA: Diagnosis not present

## 2015-10-08 DIAGNOSIS — E1142 Type 2 diabetes mellitus with diabetic polyneuropathy: Secondary | ICD-10-CM | POA: Diagnosis not present

## 2015-10-09 ENCOUNTER — Ambulatory Visit (INDEPENDENT_AMBULATORY_CARE_PROVIDER_SITE_OTHER): Payer: Medicare Other | Admitting: *Deleted

## 2015-10-09 DIAGNOSIS — Z5181 Encounter for therapeutic drug level monitoring: Secondary | ICD-10-CM

## 2015-10-09 DIAGNOSIS — Z7901 Long term (current) use of anticoagulants: Secondary | ICD-10-CM | POA: Diagnosis not present

## 2015-10-09 DIAGNOSIS — I4891 Unspecified atrial fibrillation: Secondary | ICD-10-CM | POA: Diagnosis not present

## 2015-10-09 LAB — POCT INR: INR: 2.9

## 2015-10-10 DIAGNOSIS — X32XXXD Exposure to sunlight, subsequent encounter: Secondary | ICD-10-CM | POA: Diagnosis not present

## 2015-10-10 DIAGNOSIS — L570 Actinic keratosis: Secondary | ICD-10-CM | POA: Diagnosis not present

## 2015-10-10 DIAGNOSIS — C44329 Squamous cell carcinoma of skin of other parts of face: Secondary | ICD-10-CM | POA: Diagnosis not present

## 2015-10-14 ENCOUNTER — Ambulatory Visit (INDEPENDENT_AMBULATORY_CARE_PROVIDER_SITE_OTHER): Payer: Medicare Other | Admitting: Otolaryngology

## 2015-10-14 DIAGNOSIS — H903 Sensorineural hearing loss, bilateral: Secondary | ICD-10-CM | POA: Diagnosis not present

## 2015-10-14 DIAGNOSIS — H6123 Impacted cerumen, bilateral: Secondary | ICD-10-CM | POA: Diagnosis not present

## 2015-10-15 ENCOUNTER — Other Ambulatory Visit: Payer: Self-pay | Admitting: Cardiology

## 2015-10-15 DIAGNOSIS — M5416 Radiculopathy, lumbar region: Secondary | ICD-10-CM | POA: Diagnosis not present

## 2015-10-16 ENCOUNTER — Telehealth: Payer: Self-pay | Admitting: Cardiology

## 2015-10-16 ENCOUNTER — Encounter (HOSPITAL_COMMUNITY): Payer: Self-pay

## 2015-10-16 ENCOUNTER — Encounter (HOSPITAL_COMMUNITY): Payer: Medicare Other

## 2015-10-16 ENCOUNTER — Encounter (HOSPITAL_BASED_OUTPATIENT_CLINIC_OR_DEPARTMENT_OTHER): Payer: Medicare Other

## 2015-10-16 VITALS — BP 122/73 | HR 71 | Temp 97.7°F | Resp 18

## 2015-10-16 DIAGNOSIS — C61 Malignant neoplasm of prostate: Secondary | ICD-10-CM

## 2015-10-16 DIAGNOSIS — M25551 Pain in right hip: Secondary | ICD-10-CM | POA: Diagnosis not present

## 2015-10-16 DIAGNOSIS — M1990 Unspecified osteoarthritis, unspecified site: Secondary | ICD-10-CM | POA: Diagnosis not present

## 2015-10-16 DIAGNOSIS — M545 Low back pain: Secondary | ICD-10-CM | POA: Diagnosis not present

## 2015-10-16 DIAGNOSIS — M79606 Pain in leg, unspecified: Secondary | ICD-10-CM | POA: Diagnosis not present

## 2015-10-16 DIAGNOSIS — C7951 Secondary malignant neoplasm of bone: Secondary | ICD-10-CM | POA: Diagnosis not present

## 2015-10-16 LAB — COMPREHENSIVE METABOLIC PANEL
ALT: 18 U/L (ref 17–63)
AST: 16 U/L (ref 15–41)
Albumin: 3.7 g/dL (ref 3.5–5.0)
Alkaline Phosphatase: 70 U/L (ref 38–126)
Anion gap: 7 (ref 5–15)
BUN: 21 mg/dL — ABNORMAL HIGH (ref 6–20)
CO2: 31 mmol/L (ref 22–32)
Calcium: 9.3 mg/dL (ref 8.9–10.3)
Chloride: 99 mmol/L — ABNORMAL LOW (ref 101–111)
Creatinine, Ser: 0.93 mg/dL (ref 0.61–1.24)
GFR calc Af Amer: >60 mL/min (ref >60)
GFR calc non Af Amer: >60 mL/min (ref >60)
Glucose, Bld: 250 mg/dL — ABNORMAL HIGH (ref 65–99)
Potassium: 3.6 mmol/L (ref 3.5–5.1)
Sodium: 137 mmol/L (ref 135–145)
Total Bilirubin: 1.3 mg/dL — ABNORMAL HIGH (ref 0.3–1.2)
Total Protein: 6.7 g/dL (ref 6.5–8.1)

## 2015-10-16 MED ORDER — DENOSUMAB 120 MG/1.7ML ~~LOC~~ SOLN
120.0000 mg | Freq: Once | SUBCUTANEOUS | Status: AC
  Administered 2015-10-16: 120 mg via SUBCUTANEOUS
  Filled 2015-10-16: qty 1.7

## 2015-10-16 MED ORDER — CYANOCOBALAMIN 1000 MCG/ML IJ SOLN
INTRAMUSCULAR | Status: AC
  Filled 2015-10-16: qty 1

## 2015-10-16 NOTE — Progress Notes (Signed)
[name] presents today for injection per the provider's orders.  X-geva administration without incident; see MAR for injection details.  Patient tolerated procedure well and without incident.  No questions or complaints noted at this time.

## 2015-10-16 NOTE — Telephone Encounter (Signed)
Left message for patient to call office to schedule appointment. / tg

## 2015-10-16 NOTE — Telephone Encounter (Signed)
-----   Message from Levonne Hubert, LPN sent at [id number] 11:04 AM EDT ----- Regarding: appt Patient needs appt for refills

## 2015-10-16 NOTE — Patient Instructions (Signed)
[place] at [facility] Discharge Instructions  RECOMMENDATIONS MADE BY THE CONSULTANT AND ANY TEST RESULTS WILL BE SENT TO YOUR REFERRING PHYSICIAN.  X-geva today Return as scheduled Please call the clinic if you have any questions or concerns   Thank you for choosing [place] at [facility] to provide your oncology and hematology care.  To afford each patient quality time with our provider, please arrive at least 15 minutes before your scheduled appointment time.   Beginning January 23rd 2017 lab work for the [name] will be done in the  Main lab at [facility] on 1st floor. If you have a lab appointment with the [facility] please come in thru the  Main Entrance and check in at the main information desk  You need to re-schedule your appointment should you arrive 10 or more minutes late.  We strive to give you quality time with our providers, and arriving late affects you and other patients whose appointments are after yours.  Also, if you no show three or more times for appointments you may be dismissed from the clinic at the providers discretion.     Again, thank you for choosing [facility].  Our hope is that these requests will decrease the amount of time that you wait before being seen by our physicians.       _____________________________________________________________  Should you have questions after your visit to [facility], please contact our office at [phone] between the hours of 8:30 a.m. and 4:30 p.m.  Voicemails left after 4:30 p.m. will not be returned until the following business day.  For prescription refill requests, have your pharmacy contact our office.         Resources For Cancer Patients and their Caregivers ? American Cancer Society: Can assist with transportation, wigs, general needs, runs Look Good Feel Better.        (956) 257-3270 ? Cancer Care: Provides financial  assistance, online support groups, medication/co-pay assistance.  1-800-813-HOPE (340)260-1377) ? [place] Assists [place] cancer patients and their families through emotional , educational and financial support.  (208)101-9801 ? [place] DSS Where to apply for food stamps, Medicaid and utility assistance. 8198808471 ? RCATS: Transportation to medical appointments. 778-840-6593 ? Social Security Administration: May apply for disability if have a Stage IV cancer. 270-213-0888 956-418-1177 ? [employer], Disability and Transit Services: Assists with nutrition, care and transit needs. [place] Support Programs: @10RELATIVEDAYS @ > Cancer Support Group  2nd Tuesday of the month 1pm-2pm, Journey Room  > Creative Journey  3rd Tuesday of the month 1130am-1pm, Journey Room  > Look Good Feel Better  1st Wednesday of the month 10am-12 noon, Journey Room (Call [place] to register [phone])

## 2015-10-18 ENCOUNTER — Encounter: Payer: Self-pay | Admitting: Cardiology

## 2015-10-18 ENCOUNTER — Ambulatory Visit (INDEPENDENT_AMBULATORY_CARE_PROVIDER_SITE_OTHER): Payer: Medicare Other | Admitting: Cardiology

## 2015-10-18 VITALS — BP 134/78 | HR 74 | Ht 70.0 in | Wt 187.0 lb

## 2015-10-18 DIAGNOSIS — I10 Essential (primary) hypertension: Secondary | ICD-10-CM

## 2015-10-18 DIAGNOSIS — I4891 Unspecified atrial fibrillation: Secondary | ICD-10-CM

## 2015-10-18 MED ORDER — LISINOPRIL-HYDROCHLOROTHIAZIDE 20-12.5 MG PO TABS: 1.0000 | ORAL_TABLET | Freq: Every day | ORAL | Status: DC

## 2015-10-18 MED ORDER — DILTIAZEM HCL 90 MG PO TABS: 90.0000 mg | ORAL_TABLET | Freq: Two times a day (BID) | ORAL | Status: DC

## 2015-10-18 NOTE — Progress Notes (Signed)
Clinical Summary Mr. Plaut is a 82 y.o.male seen today for follow up of the following medical problems.   1. Atrial fibrillation  - denies any palpitations. Compliant with meds. Denies any bleeding issues on coumadin.   2. HTN  - checking bp's at home, typically 130s/70s - compliant with meds   3. Prostate cancer with bone mets - followed by onc   SH: his wife Philliph Skov is also a patient of mine.  Past Medical History  Diagnosis Date  . Hypertension   . Atrial fibrillation ([place])     Dr. Harl Bowie- [facility] follows saw 11'14  . 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 Margot Chimes, thus far without recurrence.   . NHL (non-Hodgkin's lymphoma) ([place]) 08/18/2011    Diffuse large B-cell lymphoma, clinically stage IIIA, CD20 positive, status post cervical lymph node biopsy 08/03/2003 on the left. PET scan was also positive in the spleen and small bowel region, but bone marrow aspiration and biopsy were negative. So he essentially had stage IIIAs. He received R-CHOP x6 cycles with CR established by PET scan criteria on 11/23/2003 with no evidence for relapse th  . Cataracts, both eyes     surgery planned May 2015  . Bladder stones     tx. with oral meds and antibiotics, now surgery planned  . Diabetes mellitus without complication ([place])      No Known Allergies   Current Outpatient Prescriptions  Medication Sig Dispense Refill  . abiraterone Acetate (ZYTIGA) 250 MG tablet Take 4 tablets (1,000 mg total) by mouth daily. Take on an empty stomach 1 hour before or 2 hours after a meal 120 tablet 6  . acetaminophen (TYLENOL) 500 MG tablet Take 500 mg by mouth every 6 (six) hours as needed for mild pain.     Marland Kitchen amitriptyline (ELAVIL) 25 MG tablet  Take 25 mg by mouth at bedtime.    . Calcium Carb-Cholecalciferol 600-800 MG-UNIT TABS Take 1 capsule by mouth at bedtime.    . Cholecalciferol (VITAMIN D) 2000 UNITS CAPS Take 1 capsule by mouth daily.    . clotrimazole-betamethasone (LOTRISONE) cream     . Cranberry 200 MG CAPS Take 4,200 mg by mouth every morning.     . denosumab (XGEVA) 120 MG/1.7ML SOLN injection Inject 120 mg into the skin once.    . diltiazem (CARDIZEM) 90 MG tablet Take 1 tablet (90 mg total) by mouth 2 (two) times daily. 180 tablet 3  . Docusate Calcium (STOOL SOFTENER PO) Take 1 tablet by mouth 2 (two) times daily.     Marland Kitchen glipiZIDE (GLUCOTROL XL) 5 MG 24 hr tablet     . glipiZIDE (GLUCOTROL) 5 MG tablet Take by mouth daily before breakfast.    . glucose blood (ACCU-CHEK ACTIVE STRIPS) test strip Use as instructed 100 each 12  . KLOR-CON M10 10 MEQ tablet TAKE ONE TABLET BY MOUTH ONCE DAILY 30 tablet 0  . Lancets (ONETOUCH ULTRASOFT) lancets Use as instructed 100 each 12  . Leuprolide Acetate, 6 Month, (LUPRON DEPOT) 45 MG injection Inject 45 mg into the muscle every 6 (six) months.    Marland Kitchen lisinopril-hydrochlorothiazide (PRINZIDE,ZESTORETIC) 20-25 MG tablet Take 1 tablet by mouth daily. 90 tablet 3  . metFORMIN (GLUCOPHAGE) 500 MG tablet Take 500 mg  by mouth 2 (two) times daily with a meal.     . nystatin (MYCOSTATIN) powder Apply topically 4 (four) times daily.    Marland Kitchen omeprazole (PRILOSEC) 20 MG capsule Take 20 mg by mouth daily.     . predniSONE (DELTASONE) 5 MG tablet Take 1 tablet (5 mg total) by mouth 2 (two) times daily with a meal. 180 tablet 0  . SENNA PO Take 1 capsule by mouth as needed.    . warfarin (COUMADIN) 2.5 MG tablet Take 2 tablets daily except 1 tablet on Mondays, Wednesdays and Fridays or as directed by [facility] 60 tablet 3   No current facility-administered medications for this visit.     Past Surgical History  Procedure Laterality Date  . Cholecystectomy    . Incision and drainage  perirectal abscess    . Portacath placement      insertion and removal -last chemotherapy 10 yrs ago  . Lymph node dissection Left     '05  . Port-a-cath removal    . Transurethral resection of prostate N/A 08/10/2013    Procedure: TRANSURETHRAL RESECTION OF THE PROSTATE WITH GYRUS INSTRUMENTS;  Surgeon: [name], MD;  Location: [facility];  Service: Urology;  Laterality: N/A;  . Cystoscopy with litholapaxy N/A 08/10/2013    Procedure: CYSTOSCOPY WITH LITHOLAPAXY WITH [name];  Surgeon: [name], MD;  Location: [facility];  Service: Urology;  Laterality: N/A;  . Cataract extraction Right 08/2013     No Known Allergies    No family history on file.   Social History Mr. Akkerman reports that he has never smoked. He has never used smokeless tobacco. Mr. Rothwell reports that he does not drink alcohol.   Review of Systems CONSTITUTIONAL: No weight loss, fever, chills, weakness or fatigue.  HEENT: Eyes: No visual loss, blurred vision, double vision or yellow sclerae.No hearing loss, sneezing, congestion, runny nose or sore throat.  SKIN: No rash or itching.  CARDIOVASCULAR: per HPI RESPIRATORY: No shortness of breath, cough or sputum.  GASTROINTESTINAL: No anorexia, nausea, vomiting or diarrhea. No abdominal pain or blood.  GENITOURINARY: No burning on urination, no polyuria NEUROLOGICAL: No headache, dizziness, syncope, paralysis, ataxia, numbness or tingling in the extremities. No change in bowel or bladder control.  MUSCULOSKELETAL: No muscle, back pain, joint pain or stiffness.  LYMPHATICS: No enlarged nodes. No history of splenectomy.  PSYCHIATRIC: No history of depression or anxiety.  ENDOCRINOLOGIC: No reports of sweating, cold or heat intolerance. No polyuria or polydipsia.  Marland Kitchen   Physical Examination Filed Vitals:   10/18/15 0834  BP: 134/78  Pulse: 74   Filed Vitals:   10/18/15 0834  Height: 5\' 10"  (1.778 m)  Weight: 187 lb (84.823 kg)    Gen: resting  comfortably, no acute distress HEENT: no scleral icterus, pupils equal round and reactive, no palptable cervical adenopathy,  CV: irreg, no m/r/,g no jvd Resp: Clear to auscultation bilaterally GI: abdomen is soft, non-tender, non-distended, normal bowel sounds, no hepatosplenomegaly MSK: extremities are warm, no edema.  Skin: warm, no rash Neuro:  no focal deficits Psych: appropriate affect     Assessment and Plan   1. Afib  - no current symptoms. EKG in clinic shows rate controlled afib.  - continue current meds - he has not been interetsed in NOACs, continue coumadin. CHADS2Vasc score is 4  2. HTN  - bp at goal. Has had some recent orthostatic symptoms. We will decrease prinzide to 20/12.5mg  daily, encouraged to conitnue daily oral hydration.  Arnoldo Lenis, M.D.

## 2015-10-18 NOTE — Patient Instructions (Signed)
Your physician wants you to follow-up in: 1 Year with Dr. Harl Bowie. You will receive a reminder letter in the mail two months in advance. If you don't receive a letter, please call our office to schedule the follow-up appointment.  Your physician has recommended you make the following change in your medication:   Take Prinzide 20-12.5 mg Daily   If you need a refill on your cardiac medications before your next appointment, please call your pharmacy.  Thank you for choosing [facility]!

## 2015-10-23 ENCOUNTER — Other Ambulatory Visit: Payer: Self-pay | Admitting: *Deleted

## 2015-10-23 ENCOUNTER — Telehealth: Payer: Self-pay | Admitting: Cardiology

## 2015-10-23 MED ORDER — DILTIAZEM HCL 90 MG PO TABS: 90.0000 mg | ORAL_TABLET | Freq: Two times a day (BID) | ORAL | Status: DC

## 2015-10-23 NOTE — Telephone Encounter (Signed)
Please resend RX to [facility] for Diltiazem/tg

## 2015-11-06 ENCOUNTER — Ambulatory Visit (INDEPENDENT_AMBULATORY_CARE_PROVIDER_SITE_OTHER): Payer: Medicare Other | Admitting: *Deleted

## 2015-11-06 DIAGNOSIS — I4891 Unspecified atrial fibrillation: Secondary | ICD-10-CM

## 2015-11-06 DIAGNOSIS — Z7901 Long term (current) use of anticoagulants: Secondary | ICD-10-CM

## 2015-11-06 DIAGNOSIS — Z5181 Encounter for therapeutic drug level monitoring: Secondary | ICD-10-CM

## 2015-11-06 LAB — POCT INR: INR: 1.9

## 2015-11-13 ENCOUNTER — Other Ambulatory Visit (HOSPITAL_COMMUNITY): Payer: Medicare Other

## 2015-11-13 ENCOUNTER — Encounter (HOSPITAL_COMMUNITY): Payer: Self-pay | Admitting: Hematology & Oncology

## 2015-11-13 ENCOUNTER — Encounter (HOSPITAL_COMMUNITY): Payer: Medicare Other

## 2015-11-13 ENCOUNTER — Encounter (HOSPITAL_COMMUNITY): Payer: Medicare Other | Attending: Oncology

## 2015-11-13 ENCOUNTER — Encounter (HOSPITAL_BASED_OUTPATIENT_CLINIC_OR_DEPARTMENT_OTHER): Payer: Medicare Other | Admitting: Hematology & Oncology

## 2015-11-13 VITALS — BP 140/77 | HR 72 | Temp 97.8°F | Resp 18 | Wt 191.2 lb

## 2015-11-13 DIAGNOSIS — C61 Malignant neoplasm of prostate: Secondary | ICD-10-CM

## 2015-11-13 DIAGNOSIS — C7951 Secondary malignant neoplasm of bone: Secondary | ICD-10-CM

## 2015-11-13 DIAGNOSIS — M1990 Unspecified osteoarthritis, unspecified site: Secondary | ICD-10-CM | POA: Insufficient documentation

## 2015-11-13 DIAGNOSIS — M79606 Pain in leg, unspecified: Secondary | ICD-10-CM | POA: Diagnosis not present

## 2015-11-13 DIAGNOSIS — M25551 Pain in right hip: Secondary | ICD-10-CM | POA: Diagnosis not present

## 2015-11-13 DIAGNOSIS — Z79899 Other long term (current) drug therapy: Secondary | ICD-10-CM

## 2015-11-13 DIAGNOSIS — M545 Low back pain: Secondary | ICD-10-CM | POA: Diagnosis not present

## 2015-11-13 LAB — COMPREHENSIVE METABOLIC PANEL
ALT: 17 U/L (ref 17–63)
AST: 18 U/L (ref 15–41)
Albumin: 3.7 g/dL (ref 3.5–5.0)
Alkaline Phosphatase: 72 U/L (ref 38–126)
Anion gap: 7 (ref 5–15)
BUN: 15 mg/dL (ref 6–20)
CO2: 28 mmol/L (ref 22–32)
Calcium: 9 mg/dL (ref 8.9–10.3)
Chloride: 101 mmol/L (ref 101–111)
Creatinine, Ser: 0.79 mg/dL (ref 0.61–1.24)
GFR calc Af Amer: >60 mL/min (ref >60)
GFR calc non Af Amer: >60 mL/min (ref >60)
Glucose, Bld: 245 mg/dL — ABNORMAL HIGH (ref 65–99)
Potassium: 3.5 mmol/L (ref 3.5–5.1)
Sodium: 136 mmol/L (ref 135–145)
Total Bilirubin: 1.1 mg/dL (ref 0.3–1.2)
Total Protein: 6.6 g/dL (ref 6.5–8.1)

## 2015-11-13 LAB — CBC WITH DIFFERENTIAL/PLATELET
Basophils Absolute: 0 10*3/uL (ref 0.0–0.1)
Basophils Relative: 0 %
Eosinophils Absolute: 0 10*3/uL (ref 0.0–0.7)
Eosinophils Relative: 0 %
HCT: 38.9 % — ABNORMAL LOW (ref 39.0–52.0)
Hemoglobin: 13.8 g/dL (ref 13.0–17.0)
Lymphocytes Relative: 31 %
Lymphs Abs: 2.2 10*3/uL (ref 0.7–4.0)
MCH: 32.5 pg (ref 26.0–34.0)
MCHC: 35.5 g/dL (ref 30.0–36.0)
MCV: 91.5 fL (ref 78.0–100.0)
Monocytes Absolute: 0.6 10*3/uL (ref 0.1–1.0)
Monocytes Relative: 9 %
Neutro Abs: 4.4 10*3/uL (ref 1.7–7.7)
Neutrophils Relative %: 60 %
Platelets: 184 10*3/uL (ref 150–400)
RBC: 4.25 MIL/uL (ref 4.22–5.81)
RDW: 13.1 % (ref 11.5–15.5)
WBC: 7.2 10*3/uL (ref 4.0–10.5)

## 2015-11-13 LAB — PSA: PSA: <0.01 ng/mL (ref 0.00–4.00)

## 2015-11-13 MED ORDER — DENOSUMAB 120 MG/1.7ML ~~LOC~~ SOLN
120.0000 mg | Freq: Once | SUBCUTANEOUS | Status: AC
  Administered 2015-11-13: 120 mg via SUBCUTANEOUS
  Filled 2015-11-13: qty 1.7

## 2015-11-13 MED ORDER — POTASSIUM CHLORIDE CRYS ER 10 MEQ PO TBCR: 10.0000 meq | EXTENDED_RELEASE_TABLET | Freq: Every day | ORAL | 1 refills | Status: DC

## 2015-11-13 NOTE — Patient Instructions (Signed)
[place] at [facility] Discharge Instructions  RECOMMENDATIONS MADE BY THE CONSULTANT AND ANY TEST RESULTS WILL BE SENT TO YOUR REFERRING PHYSICIAN.  Xgeva 120 mg injection given today as ordered.  Thank you for choosing [place] at [facility] to provide your oncology and hematology care.  To afford each patient quality time with our provider, please arrive at least 15 minutes before your scheduled appointment time.   Beginning January 23rd 2017 lab work for the [name] will be done in the  Main lab at [facility] on 1st floor. If you have a lab appointment with the [facility] please come in thru the  Main Entrance and check in at the main information desk  You need to re-schedule your appointment should you arrive 10 or more minutes late.  We strive to give you quality time with our providers, and arriving late affects you and other patients whose appointments are after yours.  Also, if you no show three or more times for appointments you may be dismissed from the clinic at the providers discretion.     Again, thank you for choosing [facility].  Our hope is that these requests will decrease the amount of time that you wait before being seen by our physicians.       _____________________________________________________________  Should you have questions after your visit to [facility], please contact our office at [phone] between the hours of 8:30 a.m. and 4:30 p.m.  Voicemails left after 4:30 p.m. will not be returned until the following business day.  For prescription refill requests, have your pharmacy contact our office.         Resources For Cancer Patients and their Caregivers ? American Cancer Society: Can assist with transportation, wigs, general needs, runs Look Good Feel Better.        (732)613-1216 ? Cancer Care: Provides financial assistance, online support groups,  medication/co-pay assistance.  1-800-813-HOPE 534 826 7229) ? [facility] Assists [place] cancer patients and their families through emotional , educational and financial support.  (920) 386-7678 ? [place] DSS Where to apply for food stamps, Medicaid and utility assistance. (978) 771-5308 ? [facility]: Transportation to medical appointments. 8568579333 ? Social Security Administration: May apply for disability if have a Stage IV cancer. 779-404-8643 763-217-9813 ? [employer], Disability and Transit Services: Assists with nutrition, care and transit needs. [place] Support Programs: @10RELATIVEDAYS @ > Cancer Support Group  2nd Tuesday of the month 1pm-2pm, Journey Room  > Creative Journey  3rd Tuesday of the month 1130am-1pm, Journey Room  > Look Good Feel Better  1st Wednesday of the month 10am-12 noon, Journey Room (Call [place] to register [phone])

## 2015-11-13 NOTE — Progress Notes (Signed)
[name],[name], MD [place] / [place] [address]   DIAGNOSIS: NHL (non-Hodgkin's lymphoma)   Staging form: Lymphoid Neoplasms, AJCC 6th Edition     Clinical: Stage III - Signed by Baird Cancer, PA on 08/18/2011  1. 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 Significant vasomotor instability on full dose Casodex.  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 Margot Chimes in October 2005  #3. Diffuse large B-cell lymphoma clinically stage IIIAS STATUS post cervical lymph node biopsy on 08/03/2003. His lymphoma CD20 positive and PET scan showed positivity in the spleen. Bone marrow aspiration and biopsy were negative however. He received R. CHOP x6 cycles with a complete remission established by PET scan criteria on 11/23/2003. He completed 6 full cycles total. He remains without disease recurrence. He remains free of B. symptomatology.  #4 Zytiga Therapy secondary to development of castration resistant prostate cancer (01/2015) , XGEVA   CURRENT THERAPY: Lupron every 6 months, XGEVA, Zytiga  INTERVAL HISTORY: [name] 82 y.o. male returns for of castrate resistant prostate carcinoma. He continues on [place], XGEVA and Lupron.    [name] is accompanied by his wife. I personally reviewed and went over laboratory studies with the patient.  Reports continued dizziness where he feels unsteady when he first gets up. This is no worse, but it is also no better. He fell about 3 weeks ago, attributing this to there being ruts in the ground. His wife notes they both become dizzy occasionally attributing this to old age and taking medications.   His wife reports he has been frustrated as of late while they are having repairs done to their house. He becomes worried when the repairman do not come on the days they are scheduled.  Zerrick notes that he is used to people being timely and it is frustrating when they are not.   The patient has been able to mow his yard. "I'm not getting on top of the house no more". States his blood sugar this morning was 164, then he ate. He has been eating and sleeping well. He states, "You'll have to take my cook away. She is where my troubles are at", joking to his wife.   States his back does not hurt any, unless he picks up a 5 gallon bucket of water or something, "And I can live with that".   Since our last visit, the patient had a lesion removed from his right cheek by Dr. Nevada Crane. It was found to be low grade and he is scheduled to follow with Dr. Nevada Crane next month.   MEDICAL HISTORY: Past Medical History:  Diagnosis Date  . Atrial fibrillation ([place])    Dr. Harl Bowie- [facility] follows saw 11'14  . Bladder stones    tx. with oral meds and antibiotics, now surgery planned  . Cataracts, both eyes    surgery planned May 2015  . Diabetes mellitus without complication ([place])   . GIST (gastrointestinal stromal tumor), malignant ([place]) 08/18/2011   Gastrointestinal stromal tumor, that is GIST, small bowel, 4.5 cm, intermediate prognostic grade found on the PET scan in the small bowel, accounting for that small bowel activity in October 2005 with resection by Dr. Margot Chimes, thus far without recurrence.   . Hypertension   . NHL (non-Hodgkin's lymphoma) ([place]) 08/18/2011   Diffuse large B-cell lymphoma, clinically stage  IIIA, CD20 positive, status post cervical lymph node biopsy 08/03/2003 on the left. PET scan was also positive in the spleen and small bowel region, but bone marrow aspiration and biopsy were negative. So he essentially had stage IIIAs. He received R-CHOP x6 cycles with CR established by PET scan criteria on 11/23/2003 with no evidence for relapse th    has HYPERTENSION, UNSPECIFIED; ATRIAL FIBRILLATION; Long term (current) use of anticoagulants; NHL (non-Hodgkin's lymphoma) ([place]); GIST  (gastrointestinal stromal tumor), malignant ([place]); Encounter for therapeutic drug monitoring; Hypertrophy of prostate with urinary obstruction and other lower urinary tract symptoms (LUTS); Prostate cancer [facility]); and Bone metastases ([place]) on his problem list.     has No Known Allergies.  Current Outpatient Prescriptions on File Prior to Visit  Medication Sig Dispense Refill  . abiraterone Acetate (ZYTIGA) 250 MG tablet Take 4 tablets (1,000 mg total) by mouth daily. Take on an empty stomach 1 hour before or 2 hours after a meal 120 tablet 6  . acetaminophen (TYLENOL) 500 MG tablet Take 500 mg by mouth every 6 (six) hours as needed for mild pain.     Marland Kitchen amitriptyline (ELAVIL) 25 MG tablet Take 25 mg by mouth at bedtime.    . Calcium Carb-Cholecalciferol 600-800 MG-UNIT TABS Take 1 capsule by mouth at bedtime.    . Cholecalciferol (VITAMIN D) 2000 UNITS CAPS Take 1 capsule by mouth daily.    . clotrimazole-betamethasone (LOTRISONE) cream     . Cranberry 200 MG CAPS Take 4,200 mg by mouth every morning.     . denosumab (XGEVA) 120 MG/1.7ML SOLN injection Inject 120 mg into the skin once.    . diltiazem (CARDIZEM) 90 MG tablet Take 1 tablet (90 mg total) by mouth 2 (two) times daily. 180 tablet 3  . Docusate Calcium (STOOL SOFTENER PO) Take 1 tablet by mouth 2 (two) times daily.     Marland Kitchen glipiZIDE (GLUCOTROL XL) 5 MG 24 hr tablet     . glipiZIDE (GLUCOTROL) 5 MG tablet Take by mouth daily before breakfast.    . glucose blood (ACCU-CHEK ACTIVE STRIPS) test strip Use as instructed 100 each 12  . KLOR-CON M10 10 MEQ tablet TAKE ONE TABLET BY MOUTH ONCE DAILY 30 tablet 0  . Lancets (ONETOUCH ULTRASOFT) lancets Use as instructed 100 each 12  . Leuprolide Acetate, 6 Month, (LUPRON DEPOT) 45 MG injection Inject 45 mg into the muscle every 6 (six) months.    Marland Kitchen lisinopril-hydrochlorothiazide (ZESTORETIC) 20-12.5 MG tablet Take 1 tablet by mouth daily. 90 tablet 3  . metFORMIN (GLUCOPHAGE) 500 MG tablet Take  500 mg by mouth 2 (two) times daily with a meal.     . nystatin (MYCOSTATIN) powder Apply topically 4 (four) times daily.    Marland Kitchen omeprazole (PRILOSEC) 20 MG capsule Take 20 mg by mouth daily.     . predniSONE (DELTASONE) 5 MG tablet Take 1 tablet (5 mg total) by mouth 2 (two) times daily with a meal. 180 tablet 0  . SENNA PO Take 1 capsule by mouth as needed.    . warfarin (COUMADIN) 2.5 MG tablet Take 2 tablets daily except 1 tablet on Mondays, Wednesdays and Fridays or as directed by Coumadin Clinic 60 tablet 3   No current facility-administered medications on file prior to visit.     SURGICAL HISTORY: Past Surgical History:  Procedure Laterality Date  . CATARACT EXTRACTION Right 08/2013  . CHOLECYSTECTOMY    . CYSTOSCOPY WITH LITHOLAPAXY N/A 08/10/2013   Procedure: CYSTOSCOPY WITH LITHOLAPAXY  WITH [name];  Surgeon: [name], MD;  Location: [facility];  Service: Urology;  Laterality: N/A;  . INCISION AND DRAINAGE PERIRECTAL ABSCESS    . LYMPH NODE DISSECTION Left    '05  . PORT-A-CATH REMOVAL    . PORTACATH PLACEMENT     insertion and removal -last chemotherapy 10 yrs ago  . TRANSURETHRAL RESECTION OF PROSTATE N/A 08/10/2013   Procedure: TRANSURETHRAL RESECTION OF THE PROSTATE WITH GYRUS INSTRUMENTS;  Surgeon: [name], MD;  Location: [facility];  Service: Urology;  Laterality: N/A;    SOCIAL HISTORY: Social History   Social History  . Marital status: Married    Spouse name: N/A  . Number of children: N/A  . Years of education: N/A   Occupational History  . retired Retired    Hotel manager   Social History Main Topics  . Smoking status: Never Smoker  . Smokeless tobacco: Never Used  . Alcohol use No  . Drug use: No  . Sexual activity: Not Currently   Other Topics Concern  . Not on file   Social History Narrative  . No narrative on file  He has 8 grandchildren. He takes care of his garden and mows his lawn. 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  FAMILY HISTORY: History  reviewed. No pertinent family history.  Father died of pneumonia when the patient was 5 year of age Mother died of MI at 41 All of his siblings are deceased  Review of Systems  Constitutional: Positive for hot flashes. Negative for fever, chills, weight loss and malaise/fatigue. HENT: Negative for congestion, hearing loss, nosebleeds, sore throat and tinnitus.   Eyes: Negative for blurred vision, double vision, pain and discharge.  Respiratory: Negative for cough, hemoptysis, sputum production, shortness of breath and wheezing.   Cardiovascular: Negative for chest pain, palpitations, claudication, leg swelling and PND.  Gastrointestinal: Positive for abdominal pain. Negative for heartburn, nausea, vomiting, diarrhea, constipation, blood in stool and melena.  Abdominal pain which precedes hot flashes. Genitourinary: Negative for dysuria, urgency, frequency and hematuria.  Musculoskeletal: Positive for joint pain, back pain, and hip pain. Negative for myalgias and falls.  Back pain with heavy lifting. Skin: Negative for itching and rash.  Neurological: Positive for dizziness. Negative for tingling, tremors, sensory change, speech change, focal weakness, seizures, loss of consciousness, weakness and headaches.  Dizziness with standing, stable. Endo/Heme/Allergies: Does not bruise/bleed easily.  Psychiatric/Behavioral: Negative for depression, suicidal ideas, memory loss and substance abuse. The patient is not nervous/anxious and does not have insomnia.    14 point review of systems was performed and is negative except as detailed under history of present illness and above   PHYSICAL EXAMINATION  ECOG PERFORMANCE STATUS: 1 - Symptomatic but completely ambulatory  Vitals:   11/13/15 1000  BP: 140/77  Pulse: 72  Resp: 18  Temp: 97.8 F (36.6 C)    Physical Exam  Constitutional: He is oriented to person, place, and time and well-developed, well-nourished, and in no distress. Appears  younger than stated age.   HENT:  Head: Normocephalic and atraumatic.  Nose: Nose normal.  Mouth/Throat: Oropharynx is clear and moist. No oropharyngeal exudate.  Eyes: Conjunctivae and EOM are normal. Pupils are equal, round, and reactive to light. Right eye exhibits no discharge. Left eye exhibits no discharge. No scleral icterus.  Neck: Normal range of motion. Neck supple. No tracheal deviation present. No thyromegaly present.  Cardiovascular: Normal rate, regular rhythm and normal heart sounds.  Exam reveals no gallop and no friction rub.  No murmur heard. Pulmonary/Chest: Effort normal and breath sounds normal. He has no wheezes. He has no rales.  Abdominal: Soft. Bowel sounds are normal. He exhibits no distension and no mass. There is no tenderness. There is no rebound and no guarding.  Musculoskeletal: Normal range of motion. He exhibits no edema.  Lymphadenopathy:    He has no cervical adenopathy.  Neurological: He is alert and oriented to person, place, and time. He has normal reflexes. No cranial nerve deficit. Gait normal. Coordination normal.  Skin: Skin is warm and dry. No rash noted.  Psychiatric: Mood, memory, affect and judgment normal.  Nursing note and vitals reviewed.  LABORATORY DATA: I have reviewed the labs below.  CBC    Component Value Date/Time   WBC 7.2 11/13/2015 0943   RBC 4.25 11/13/2015 0943   HGB 13.8 11/13/2015 0943   HCT 38.9 (L) 11/13/2015 0943   PLT 184 11/13/2015 0943   MCV 91.5 11/13/2015 0943   MCH 32.5 11/13/2015 0943   MCHC 35.5 11/13/2015 0943   RDW 13.1 11/13/2015 0943   LYMPHSABS 2.2 11/13/2015 0943   MONOABS 0.6 11/13/2015 0943   EOSABS 0.0 11/13/2015 0943   BASOSABS 0.0 11/13/2015 0943   CMP     Component Value Date/Time   NA 136 11/13/2015 0943   K 3.5 11/13/2015 0943   CL 101 11/13/2015 0943   CO2 28 11/13/2015 0943   GLUCOSE 245 (H) 11/13/2015 0943   BUN 15 11/13/2015 0943   CREATININE 0.79 11/13/2015 0943   CREATININE  1.14 07/27/2013 1004   CALCIUM 9.0 11/13/2015 0943   PROT 6.6 11/13/2015 0943   ALBUMIN 3.7 11/13/2015 0943   AST 18 11/13/2015 0943   ALT 17 11/13/2015 0943   ALKPHOS 72 11/13/2015 0943   BILITOT 1.1 11/13/2015 0943   GFRNONAA >60 11/13/2015 0943   GFRNONAA 58 (L) 07/27/2013 1004   GFRAA >60 11/13/2015 0943   GFRAA 66 07/27/2013 1004   Results for ALMANDO, VITTITOW (MRN [id number])   Ref. Range 01/25/2015 10:18 03/06/2015 10:52 04/02/2015 10:45 07/24/2015 10:06 11/13/2015 09:43  PSA Latest Ref Range: 0.00 - 4.00 ng/mL 12.22 (H) 0.27 0.03 <0.01 <0.01     RADIOLOGY: I have personally reviewed the radiological images as listed and agreed with the findings in the report.  CLINICAL DATA:  Prostate cancer.  Left hip pain.  EXAM: NUCLEAR MEDICINE WHOLE BODY BONE SCAN  TECHNIQUE: Whole body anterior and posterior images were obtained approximately 3 hours after intravenous injection of radiopharmaceutical.  RADIOPHARMACEUTICALS:  25.1 mCi Technetium-72m MDP IV  COMPARISON:  10/05/2014  FINDINGS: Increased activity within the right side of the T12 vertebral body is stable since prior study. No other areas of suspicious bony uptake. Soft tissue activity is unremarkable. Probable degenerative uptake in the right shoulder. Previously seen sternal uptake no longer visualized.  IMPRESSION: Improving previously seen abnormal activity in the lower lumbar spine and sternum.  Stable abnormal activity in the right side of the T12 vertebral body.  No new areas of abnormal activity.   Electronically Signed   By: Rolm Baptise M.D.   On: 08/26/2015 15:04    ASSESSMENT and THERAPY PLAN:  Stage IV adenocarcinoma of the prostate Lupron therapy/Casodex Rising PSA with value of 3.11 ng/ml on 09/10/2014  DEXA with normal bone density XGEVA monthly Casodex discontinued with continued rise of PSA Zytiga 01/2015  Labs reviewed. PSA is excellent. He is doing reasonably well. Still  quite independent.   He has an appointment scheduled  at the [facility] Coumadin clinic on 11/27/15.  I have refilled the patient's potassium supplement 10 once daily. [facility] [place]  He will return in 1 month for follow up. He is to continue on XGEVA, zytiga and prednisone.   All questions were answered. The patient knows to call the clinic with any problems, questions or concerns. We can certainly see the patient much sooner if necessary.   This document serves as a record of services personally performed by Ancil Linsey, MD. It was created on her behalf by Arlyce Harman, a trained medical scribe. The creation of this record is based on the scribe's personal observations and the provider's statements to them. This document has been checked and approved by the attending provider.  I have reviewed the above documentation for accuracy and completeness, and I agree with the above.  This note was electronically signed.  Kelby Fam. Penland MD

## 2015-11-13 NOTE — Progress Notes (Signed)
[name] presents today for injection per MD orders. Xgeva 120 mg  administered SQ in left Abdomen. Administration without incident. Patient tolerated well.    

## 2015-11-13 NOTE — Patient Instructions (Signed)
[place] at [facility] Discharge Instructions  RECOMMENDATIONS MADE BY THE CONSULTANT AND ANY TEST RESULTS WILL BE SENT TO YOUR REFERRING PHYSICIAN.  You saw Dr. Whitney Muse today. Continue Lupron,Xgeva and zytiga. Return to clinic in one month for labs and follow up.  Thank you for choosing [place] at [facility] to provide your oncology and hematology care.  To afford each patient quality time with our provider, please arrive at least 15 minutes before your scheduled appointment time.   Beginning January 23rd 2017 lab work for the [name] will be done in the  Main lab at [facility] on 1st floor. If you have a lab appointment with the [place] please come in thru the  Main Entrance and check in at the main information desk  You need to re-schedule your appointment should you arrive 10 or more minutes late.  We strive to give you quality time with our providers, and arriving late affects you and other patients whose appointments are after yours.  Also, if you no show three or more times for appointments you may be dismissed from the clinic at the providers discretion.     Again, thank you for choosing [facility].  Our hope is that these requests will decrease the amount of time that you wait before being seen by our physicians.       _____________________________________________________________  Should you have questions after your visit to [facility], please contact our office at [phone] between the hours of 8:30 a.m. and 4:30 p.m.  Voicemails left after 4:30 p.m. will not be returned until the following business day.  For prescription refill requests, have your pharmacy contact our office.         Resources For Cancer Patients and their Caregivers ? American Cancer Society: Can assist with transportation, wigs, general needs, runs Look Good Feel Better.        (548) 439-9217 ? Cancer  Care: Provides financial assistance, online support groups, medication/co-pay assistance.  1-800-813-HOPE 934-177-3985) ? [place] Assists [place] cancer patients and their families through emotional , educational and financial support.  531-324-1902 ? [place] DSS Where to apply for food stamps, Medicaid and utility assistance. 703-435-8182 ? RCATS: Transportation to medical appointments. 425 524 1735 ? Social Security Administration: May apply for disability if have a Stage IV cancer. 615-617-7349 925-832-1188 ? [employer], Disability and Transit Services: Assists with nutrition, care and transit needs. [place] Support Programs: @10RELATIVEDAYS @ > Cancer Support Group  2nd Tuesday of the month 1pm-2pm, Journey Room  > Creative Journey  3rd Tuesday of the month 1130am-1pm, Journey Room  > Look Good Feel Better  1st Wednesday of the month 10am-12 noon, Journey Room (Call [place] to register [phone])

## 2015-11-16 LAB — TESTOSTERONE: Testosterone: <3 ng/dL — ABNORMAL LOW (ref 264–916)

## 2015-11-19 LAB — TESTOSTERONE, % FREE: Testosterone-% Free: 0.7 % — ABNORMAL LOW (ref 0.2–0.7)

## 2015-11-20 LAB — TESTOSTERONE, FREE: Testosterone, Free: <0.2 pg/mL — ABNORMAL LOW (ref 6.6–18.1)

## 2015-11-27 ENCOUNTER — Ambulatory Visit (INDEPENDENT_AMBULATORY_CARE_PROVIDER_SITE_OTHER): Payer: Medicare Other | Admitting: *Deleted

## 2015-11-27 DIAGNOSIS — I4891 Unspecified atrial fibrillation: Secondary | ICD-10-CM

## 2015-11-27 DIAGNOSIS — Z7901 Long term (current) use of anticoagulants: Secondary | ICD-10-CM

## 2015-11-27 DIAGNOSIS — Z5181 Encounter for therapeutic drug level monitoring: Secondary | ICD-10-CM | POA: Diagnosis not present

## 2015-11-27 LAB — POCT INR: INR: 2

## 2015-12-06 ENCOUNTER — Encounter (HOSPITAL_COMMUNITY): Payer: Self-pay | Admitting: Hematology & Oncology

## 2015-12-11 ENCOUNTER — Encounter (HOSPITAL_COMMUNITY): Payer: Medicare Other

## 2015-12-11 ENCOUNTER — Encounter (HOSPITAL_COMMUNITY): Payer: Medicare Other | Attending: Oncology

## 2015-12-11 ENCOUNTER — Encounter (HOSPITAL_BASED_OUTPATIENT_CLINIC_OR_DEPARTMENT_OTHER): Payer: Medicare Other | Admitting: Hematology & Oncology

## 2015-12-11 ENCOUNTER — Encounter (HOSPITAL_COMMUNITY): Payer: Self-pay | Admitting: Hematology & Oncology

## 2015-12-11 VITALS — BP 138/68 | HR 86 | Temp 97.9°F | Resp 18 | Wt 187.6 lb

## 2015-12-11 DIAGNOSIS — I10 Essential (primary) hypertension: Secondary | ICD-10-CM | POA: Diagnosis not present

## 2015-12-11 DIAGNOSIS — E119 Type 2 diabetes mellitus without complications: Secondary | ICD-10-CM | POA: Diagnosis not present

## 2015-12-11 DIAGNOSIS — C7951 Secondary malignant neoplasm of bone: Secondary | ICD-10-CM

## 2015-12-11 DIAGNOSIS — M79606 Pain in leg, unspecified: Secondary | ICD-10-CM | POA: Insufficient documentation

## 2015-12-11 DIAGNOSIS — Z79899 Other long term (current) drug therapy: Secondary | ICD-10-CM

## 2015-12-11 DIAGNOSIS — M545 Low back pain: Secondary | ICD-10-CM | POA: Diagnosis not present

## 2015-12-11 DIAGNOSIS — M25551 Pain in right hip: Secondary | ICD-10-CM | POA: Insufficient documentation

## 2015-12-11 DIAGNOSIS — C61 Malignant neoplasm of prostate: Secondary | ICD-10-CM

## 2015-12-11 DIAGNOSIS — M1990 Unspecified osteoarthritis, unspecified site: Secondary | ICD-10-CM | POA: Diagnosis not present

## 2015-12-11 LAB — COMPREHENSIVE METABOLIC PANEL
ALT: 16 U/L — ABNORMAL LOW (ref 17–63)
AST: 18 U/L (ref 15–41)
Albumin: 3.9 g/dL (ref 3.5–5.0)
Alkaline Phosphatase: 77 U/L (ref 38–126)
Anion gap: 7 (ref 5–15)
BUN: 18 mg/dL (ref 6–20)
CO2: 28 mmol/L (ref 22–32)
Calcium: 9.4 mg/dL (ref 8.9–10.3)
Chloride: 101 mmol/L (ref 101–111)
Creatinine, Ser: 1.02 mg/dL (ref 0.61–1.24)
GFR calc Af Amer: >60 mL/min (ref >60)
GFR calc non Af Amer: >60 mL/min (ref >60)
Glucose, Bld: 267 mg/dL — ABNORMAL HIGH (ref 65–99)
Potassium: 3.7 mmol/L (ref 3.5–5.1)
Sodium: 136 mmol/L (ref 135–145)
Total Bilirubin: 1.2 mg/dL (ref 0.3–1.2)
Total Protein: 6.9 g/dL (ref 6.5–8.1)

## 2015-12-11 MED ORDER — DENOSUMAB 120 MG/1.7ML ~~LOC~~ SOLN
120.0000 mg | Freq: Once | SUBCUTANEOUS | Status: AC
  Administered 2015-12-11: 120 mg via SUBCUTANEOUS
  Filled 2015-12-11: qty 1.7

## 2015-12-11 NOTE — Patient Instructions (Signed)
[place] at [facility] Discharge Instructions  RECOMMENDATIONS MADE BY THE CONSULTANT AND ANY TEST RESULTS WILL BE SENT TO YOUR REFERRING PHYSICIAN.  You saw Dr. Whitney Muse today. Continue Xgeva monthly. Continue Lupron. Follow up in 2 months with Tom and lab work.  Thank you for choosing [place] at [facility] to provide your oncology and hematology care.  To afford each patient quality time with our provider, please arrive at least 15 minutes before your scheduled appointment time.   Beginning January 23rd 2017 lab work for the [name] will be done in the  Main lab at [facility] on 1st floor. If you have a lab appointment with the [place] please come in thru the  Main Entrance and check in at the main information desk  You need to re-schedule your appointment should you arrive 10 or more minutes late.  We strive to give you quality time with our providers, and arriving late affects you and other patients whose appointments are after yours.  Also, if you no show three or more times for appointments you may be dismissed from the clinic at the providers discretion.     Again, thank you for choosing [facility].  Our hope is that these requests will decrease the amount of time that you wait before being seen by our physicians.       _____________________________________________________________  Should you have questions after your visit to [facility], please contact our office at [phone] between the hours of 8:30 a.m. and 4:30 p.m.  Voicemails left after 4:30 p.m. will not be returned until the following business day.  For prescription refill requests, have your pharmacy contact our office.         Resources For Cancer Patients and their Caregivers ? American Cancer Society: Can assist with transportation, wigs, general needs, runs Look Good Feel Better.        [phone] ? Cancer  Care: Provides financial assistance, online support groups, medication/co-pay assistance.  1-800-813-HOPE 502-559-1568) ? [place] Assists [place] cancer patients and their families through emotional , educational and financial support.  (775)612-9979 ? [place] DSS Where to apply for food stamps, Medicaid and utility assistance. 7633041273 ? RCATS: Transportation to medical appointments. 484-174-1479 ? Social Security Administration: May apply for disability if have a Stage IV cancer. 620-791-1615 (218) 850-1775 ? [employer], Disability and Transit Services: Assists with nutrition, care and transit needs. [place] Support Programs: @10RELATIVEDAYS @ > Cancer Support Group  2nd Tuesday of the month 1pm-2pm, Journey Room  > Creative Journey  3rd Tuesday of the month 1130am-1pm, Journey Room  > Look Good Feel Better  1st Wednesday of the month 10am-12 noon, Journey Room (Call [place] to register [phone])

## 2015-12-11 NOTE — Patient Instructions (Signed)
[place] at [facility] Discharge Instructions  RECOMMENDATIONS MADE BY THE CONSULTANT AND ANY TEST RESULTS WILL BE SENT TO YOUR REFERRING PHYSICIAN.  You were given Xgeva today. Return as scheduled.    Thank you for choosing [place] at [facility] to provide your oncology and hematology care.  To afford each patient quality time with our provider, please arrive at least 15 minutes before your scheduled appointment time.   Beginning January 23rd 2017 lab work for the [name] will be done in the  Main lab at [facility] on 1st floor. If you have a lab appointment with the [place] please come in thru the  Main Entrance and check in at the main information desk  You need to re-schedule your appointment should you arrive 10 or more minutes late.  We strive to give you quality time with our providers, and arriving late affects you and other patients whose appointments are after yours.  Also, if you no show three or more times for appointments you may be dismissed from the clinic at the providers discretion.     Again, thank you for choosing [facility].  Our hope is that these requests will decrease the amount of time that you wait before being seen by our physicians.       _____________________________________________________________  Should you have questions after your visit to [facility], please contact our office at [phone] between the hours of 8:30 a.m. and 4:30 p.m.  Voicemails left after 4:30 p.m. will not be returned until the following business day.  For prescription refill requests, have your pharmacy contact our office.         Resources For Cancer Patients and their Caregivers ? American Cancer Society: Can assist with transportation, wigs, general needs, runs Look Good Feel Better.        434-744-6741 ? Cancer Care: Provides financial assistance, online support groups,  medication/co-pay assistance.  1-800-813-HOPE 567-822-7265) ? [place] Assists [place] cancer patients and their families through emotional , educational and financial support.  7626900592 ? [place] DSS Where to apply for food stamps, Medicaid and utility assistance. 3393425789 ? RCATS: Transportation to medical appointments. (640)051-5385 ? Social Security Administration: May apply for disability if have a Stage IV cancer. (641) 861-3699 682-386-2292 ? [employer], Disability and Transit Services: Assists with nutrition, care and transit needs. [place] Support Programs: @10RELATIVEDAYS @ > Cancer Support Group  2nd Tuesday of the month 1pm-2pm, Journey Room  > Creative Journey  3rd Tuesday of the month 1130am-1pm, Journey Room  > Look Good Feel Better  1st Wednesday of the month 10am-12 noon, Journey Room (Call [place] to register [phone])

## 2015-12-11 NOTE — Progress Notes (Signed)
Pt given Xgeva SQ left lower abdomen. Pt tolerated well. Return as scheduled,

## 2015-12-11 NOTE — Progress Notes (Signed)
[name],[name], MD [place] / [place] [address]   DIAGNOSIS: NHL (non-Hodgkin's lymphoma)   Staging form: Lymphoid Neoplasms, AJCC 6th Edition     Clinical: Stage III - Signed by Baird Cancer, PA on 08/18/2011  1. 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 Significant vasomotor instability on full dose Casodex.  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 Margot Chimes in October 2005  #3. Diffuse large B-cell lymphoma clinically stage IIIAS STATUS post cervical lymph node biopsy on 08/03/2003. His lymphoma CD20 positive and PET scan showed positivity in the spleen. Bone marrow aspiration and biopsy were negative however. He received R. CHOP x6 cycles with a complete remission established by PET scan criteria on 11/23/2003. He completed 6 full cycles total. He remains without disease recurrence. He remains free of B. symptomatology.  #4 Zytiga Therapy secondary to development of castration resistant prostate cancer (01/2015) , XGEVA   CURRENT THERAPY: Lupron every 6 months, XGEVA, Zytiga  INTERVAL HISTORY: [name] 82 y.o. male returns for of castrate resistant prostate carcinoma. He continues on [place], XGEVA and Lupron.    Mr. Forino is accompanied by his wife. I personally reviewed and went over laboratory studies with the patient.  His wife notes he is doing better and not angry. He gets frustrated when he is unable to do things physically that he wants to do.   The patient states, "Right now I feel okay". His only complaint is that he feels weak when first getting up so he takes it easy to avoid falling.   He has noticed he is more shaky at times. He isn't sure if it is due to any of the medicine he is taking. His mother did not develop a tremor as she aged and he is not sure about his father.   The dizzy  spells have improved, but still happen when he gets up. He denies any more episodes of falling back with standing.  He denies rash.   He washed his car this morning and painted a wagon. He continues to ride his bicycle. When asked how he is eating the patient states, "I'm eating too much". He and his wife plan to travel to [place] again this winter.    MEDICAL HISTORY: Past Medical History:  Diagnosis Date  . Atrial fibrillation ([place])    Dr. Harl Bowie- [facility] follows saw 11'14  . Bladder stones    tx. with oral meds and antibiotics, now surgery planned  . Cataracts, both eyes    surgery planned May 2015  . Diabetes mellitus without complication ([place])   . GIST (gastrointestinal stromal tumor), malignant ([place]) 08/18/2011   Gastrointestinal stromal tumor, that is GIST, small bowel, 4.5 cm, intermediate prognostic grade found on the PET scan in the small bowel, accounting for that small bowel activity in October 2005 with resection by Dr. Margot Chimes, thus far without recurrence.   . Hypertension   . NHL (non-Hodgkin's lymphoma) ([place]) 08/18/2011   Diffuse large B-cell lymphoma, clinically stage IIIA, CD20 positive, status post cervical lymph node biopsy 08/03/2003 on the left. PET scan was also positive in the spleen and small bowel region, but bone marrow aspiration and biopsy were negative. So he essentially had stage IIIAs. He received R-CHOP x6 cycles with CR established by PET scan criteria on 11/23/2003 with no evidence for relapse  th    has HYPERTENSION, UNSPECIFIED; ATRIAL FIBRILLATION; Long term (current) use of anticoagulants; NHL (non-Hodgkin's lymphoma) ([place]); GIST (gastrointestinal stromal tumor), malignant ([place]); Encounter for therapeutic drug monitoring; Hypertrophy of prostate with urinary obstruction and other lower urinary tract symptoms (LUTS); Prostate cancer [facility]); and Bone metastases ([place]) on his problem list.     has No Known Allergies.  Current Outpatient Prescriptions on File  Prior to Visit  Medication Sig Dispense Refill  . abiraterone Acetate (ZYTIGA) 250 MG tablet Take 4 tablets (1,000 mg total) by mouth daily. Take on an empty stomach 1 hour before or 2 hours after a meal 120 tablet 6  . acetaminophen (TYLENOL) 500 MG tablet Take 500 mg by mouth every 6 (six) hours as needed for mild pain.     Marland Kitchen amitriptyline (ELAVIL) 25 MG tablet Take 25 mg by mouth at bedtime.    . Calcium Carb-Cholecalciferol 600-800 MG-UNIT TABS Take 1 capsule by mouth at bedtime.    . Cholecalciferol (VITAMIN D) 2000 UNITS CAPS Take 1 capsule by mouth daily.    . clotrimazole-betamethasone (LOTRISONE) cream     . Cranberry 200 MG CAPS Take 4,200 mg by mouth every morning.     . denosumab (XGEVA) 120 MG/1.7ML SOLN injection Inject 120 mg into the skin once.    . diltiazem (CARDIZEM) 90 MG tablet Take 1 tablet (90 mg total) by mouth 2 (two) times daily. 180 tablet 3  . Docusate Calcium (STOOL SOFTENER PO) Take 1 tablet by mouth 2 (two) times daily.     Marland Kitchen glipiZIDE (GLUCOTROL XL) 5 MG 24 hr tablet     . glipiZIDE (GLUCOTROL) 5 MG tablet Take by mouth daily before breakfast.    . glucose blood (ACCU-CHEK ACTIVE STRIPS) test strip Use as instructed 100 each 12  . Lancets (ONETOUCH ULTRASOFT) lancets Use as instructed 100 each 12  . Leuprolide Acetate, 6 Month, (LUPRON DEPOT) 45 MG injection Inject 45 mg into the muscle every 6 (six) months.    Marland Kitchen lisinopril-hydrochlorothiazide (ZESTORETIC) 20-12.5 MG tablet Take 1 tablet by mouth daily. 90 tablet 3  . metFORMIN (GLUCOPHAGE) 500 MG tablet Take 500 mg by mouth 2 (two) times daily with a meal.     . nystatin (MYCOSTATIN) powder Apply topically 4 (four) times daily.    Marland Kitchen omeprazole (PRILOSEC) 20 MG capsule Take 20 mg by mouth daily.     . potassium chloride (KLOR-CON M10) 10 MEQ tablet Take 1 tablet (10 mEq total) by mouth daily. 90 tablet 1  . predniSONE (DELTASONE) 5 MG tablet Take 1 tablet (5 mg total) by mouth 2 (two) times daily with a meal. 180  tablet 0  . SENNA PO Take 1 capsule by mouth as needed.    . warfarin (COUMADIN) 2.5 MG tablet Take 2 tablets daily except 1 tablet on Mondays, Wednesdays and Fridays or as directed by Coumadin Clinic 60 tablet 3   No current facility-administered medications on file prior to visit.     SURGICAL HISTORY: Past Surgical History:  Procedure Laterality Date  . CATARACT EXTRACTION Right 08/2013  . CHOLECYSTECTOMY    . CYSTOSCOPY WITH LITHOLAPAXY N/A 08/10/2013   Procedure: CYSTOSCOPY WITH LITHOLAPAXY WITH [name];  Surgeon: [name], MD;  Location: [facility];  Service: Urology;  Laterality: N/A;  . INCISION AND DRAINAGE PERIRECTAL ABSCESS    . LYMPH NODE DISSECTION Left    '05  . PORT-A-CATH REMOVAL    . PORTACATH PLACEMENT     insertion and removal -last  chemotherapy 10 yrs ago  . TRANSURETHRAL RESECTION OF PROSTATE N/A 08/10/2013   Procedure: TRANSURETHRAL RESECTION OF THE PROSTATE WITH GYRUS INSTRUMENTS;  Surgeon: [name], MD;  Location: [facility];  Service: Urology;  Laterality: N/A;    SOCIAL HISTORY: Social History   Social History  . Marital status: Married    Spouse name: N/A  . Number of children: N/A  . Years of education: N/A   Occupational History  . retired Retired    Hotel manager   Social History Main Topics  . Smoking status: Never Smoker  . Smokeless tobacco: Never Used  . Alcohol use No  . Drug use: No  . Sexual activity: Not Currently   Other Topics Concern  . Not on file   Social History Narrative  . No narrative on file  He has 8 grandchildren. He takes care of his garden and mows his lawn. 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  FAMILY HISTORY: History reviewed. No pertinent family history.  Father died of pneumonia when the patient was 64 year of age Mother died of MI at 69 All of his siblings are deceased  Review of Systems  Constitutional: Positive for hot flashes. Negative for fever, chills, weight loss and malaise/fatigue. HENT: Negative for  congestion, hearing loss, nosebleeds, sore throat and tinnitus.   Eyes: Negative for blurred vision, double vision, pain and discharge.  Respiratory: Negative for cough, hemoptysis, sputum production, shortness of breath and wheezing.   Cardiovascular: Negative for chest pain, palpitations, claudication, leg swelling and PND.  Gastrointestinal: Positive for abdominal pain. Negative for heartburn, nausea, vomiting, diarrhea, constipation, blood in stool and melena.  Abdominal pain which precedes hot flashes. Genitourinary: Negative for dysuria, urgency, frequency and hematuria.  Musculoskeletal: Positive for joint pain, back pain, and hip pain, improved. Negative for myalgias and falls.  Skin: Negative for itching and rash.  Neurological: Positive for dizziness. Negative for tingling, tremors, sensory change, speech change, focal weakness, seizures, loss of consciousness, weakness and headaches.  Dizziness with standing, stable. Endo/Heme/Allergies: Does not bruise/bleed easily.  Psychiatric/Behavioral: Negative for depression, suicidal ideas, memory loss and substance abuse. The patient is not nervous/anxious and does not have insomnia.    14 point review of systems was performed and is negative except as detailed under history of present illness and above   PHYSICAL EXAMINATION  ECOG PERFORMANCE STATUS: 1 - Symptomatic but completely ambulatory  Vitals:   12/11/15 1329  BP: 138/68  Pulse: 86  Resp: 18  Temp: 97.9 F (36.6 C)    Physical Exam  Constitutional: He is oriented to person, place, and time and well-developed, well-nourished, and in no distress. Appears younger than stated age.   HENT:  Head: Normocephalic and atraumatic.  Nose: Nose normal.  Mouth/Throat: Oropharynx is clear and moist. No oropharyngeal exudate.  Eyes: Conjunctivae and EOM are normal. Pupils are equal, round, and reactive to light. Right eye exhibits no discharge. Left eye exhibits no discharge. No  scleral icterus.  Evidence of cataract surgery, lenses on each eye. Neck: Normal range of motion. Neck supple. No tracheal deviation present. No thyromegaly present.  Cardiovascular: Normal rate, regular rhythm and normal heart sounds.  Exam reveals no gallop and no friction rub.   No murmur heard. Pulmonary/Chest: Effort normal and breath sounds normal. He has no wheezes. He has no rales.  Abdominal: Soft. Bowel sounds are normal. He exhibits no distension and no mass. There is no tenderness. There is no rebound and no guarding.  Musculoskeletal: Normal range of  motion. He exhibits no edema.  Lymphadenopathy:    He has no cervical adenopathy.  Neurological: He is alert and oriented to person, place, and time. He has normal reflexes. No cranial nerve deficit. Gait normal. Coordination normal.  Skin: Skin is warm and dry. No rash noted.  Psychiatric: Mood, memory, affect and judgment normal.  Nursing note and vitals reviewed.  LABORATORY DATA: I have reviewed the labs below.  CBC    Component Value Date/Time   WBC 7.2 11/13/2015 0943   RBC 4.25 11/13/2015 0943   HGB 13.8 11/13/2015 0943   HCT 38.9 (L) 11/13/2015 0943   PLT 184 11/13/2015 0943   MCV 91.5 11/13/2015 0943   MCH 32.5 11/13/2015 0943   MCHC 35.5 11/13/2015 0943   RDW 13.1 11/13/2015 0943   LYMPHSABS 2.2 11/13/2015 0943   MONOABS 0.6 11/13/2015 0943   EOSABS 0.0 11/13/2015 0943   BASOSABS 0.0 11/13/2015 0943   CMP     Component Value Date/Time   NA 136 12/11/2015 1306   K 3.7 12/11/2015 1306   CL 101 12/11/2015 1306   CO2 28 12/11/2015 1306   GLUCOSE 267 (H) 12/11/2015 1306   BUN 18 12/11/2015 1306   CREATININE 1.02 12/11/2015 1306   CREATININE 1.14 07/27/2013 1004   CALCIUM 9.4 12/11/2015 1306   PROT 6.9 12/11/2015 1306   ALBUMIN 3.9 12/11/2015 1306   AST 18 12/11/2015 1306   ALT 16 (L) 12/11/2015 1306   ALKPHOS 77 12/11/2015 1306   BILITOT 1.2 12/11/2015 1306   GFRNONAA >60 12/11/2015 1306   GFRNONAA  58 (L) 07/27/2013 1004   GFRAA >60 12/11/2015 1306   GFRAA 66 07/27/2013 1004   Results for ROBYN, RAMETTA (MRN [id number]) as of 12/11/2015 13:31  Ref. Range 01/25/2015 10:18 03/06/2015 10:52 04/02/2015 10:45 07/24/2015 10:06 11/13/2015 09:43  PSA Latest Ref Range: 0.00 - 4.00 ng/mL 12.22 (H) 0.27 0.03 <0.01 <0.01     RADIOLOGY: I have personally reviewed the radiological images as listed and agreed with the findings in the report.  CLINICAL DATA:  Prostate cancer.  Left hip pain.  EXAM: NUCLEAR MEDICINE WHOLE BODY BONE SCAN  TECHNIQUE: Whole body anterior and posterior images were obtained approximately 3 hours after intravenous injection of radiopharmaceutical.  RADIOPHARMACEUTICALS:  25.1 mCi Technetium-64m MDP IV  COMPARISON:  10/05/2014  FINDINGS: Increased activity within the right side of the T12 vertebral body is stable since prior study. No other areas of suspicious bony uptake. Soft tissue activity is unremarkable. Probable degenerative uptake in the right shoulder. Previously seen sternal uptake no longer visualized.  IMPRESSION: Improving previously seen abnormal activity in the lower lumbar spine and sternum.  Stable abnormal activity in the right side of the T12 vertebral body.  No new areas of abnormal activity.   Electronically Signed   By: Rolm Baptise M.D.   On: 08/26/2015 15:04    ASSESSMENT and THERAPY PLAN:  Stage IV adenocarcinoma of the prostate Lupron therapy/Casodex Rising PSA with value of 3.11 ng/ml on 09/10/2014  DEXA with normal bone density XGEVA monthly Casodex discontinued with continued rise of PSA Zytiga 01/2015  Labs reviewed. PSA has been excellent. He is doing reasonably well. Still quite independent. He has some frustration with limitations associated with aging but seems to be doing better than earlier in the year. They are planning to return to [place] again this winter.   He is due for prolia today. He will  continue on Lupron Q 6 months.   He will  continue his Zytiga. He will return for follow up in 2 months.   Orders Placed This Encounter  Procedures  . CBC with Differential    Standing Status:   Future    Standing Expiration Date:   12/10/2016  . Comprehensive metabolic panel    Standing Status:   Future    Standing Expiration Date:   12/10/2016  . PSA    Standing Status:   Future    Standing Expiration Date:   12/10/2016    All questions were answered. The patient knows to call the clinic with any problems, questions or concerns. We can certainly see the patient much sooner if necessary.   This document serves as a record of services personally performed by Ancil Linsey, MD. It was created on her behalf by Arlyce Harman, a trained medical scribe. The creation of this record is based on the scribe's personal observations and the provider's statements to them. This document has been checked and approved by the attending provider.  I have reviewed the above documentation for accuracy and completeness, and I agree with the above.  This note was electronically signed.  Kelby Fam. Jerricka Carvey MD

## 2015-12-12 ENCOUNTER — Encounter (HOSPITAL_COMMUNITY): Payer: Self-pay | Admitting: Hematology & Oncology

## 2015-12-17 ENCOUNTER — Ambulatory Visit (INDEPENDENT_AMBULATORY_CARE_PROVIDER_SITE_OTHER): Payer: Medicare Other | Admitting: Urology

## 2015-12-17 DIAGNOSIS — B351 Tinea unguium: Secondary | ICD-10-CM | POA: Diagnosis not present

## 2015-12-17 DIAGNOSIS — C61 Malignant neoplasm of prostate: Secondary | ICD-10-CM

## 2015-12-17 DIAGNOSIS — L851 Acquired keratosis [keratoderma] palmaris et plantaris: Secondary | ICD-10-CM | POA: Diagnosis not present

## 2015-12-17 DIAGNOSIS — E1142 Type 2 diabetes mellitus with diabetic polyneuropathy: Secondary | ICD-10-CM | POA: Diagnosis not present

## 2015-12-17 DIAGNOSIS — N400 Enlarged prostate without lower urinary tract symptoms: Secondary | ICD-10-CM | POA: Diagnosis not present

## 2015-12-17 DIAGNOSIS — C7951 Secondary malignant neoplasm of bone: Secondary | ICD-10-CM

## 2015-12-25 ENCOUNTER — Ambulatory Visit (INDEPENDENT_AMBULATORY_CARE_PROVIDER_SITE_OTHER): Payer: Medicare Other | Admitting: *Deleted

## 2015-12-25 DIAGNOSIS — Z5181 Encounter for therapeutic drug level monitoring: Secondary | ICD-10-CM

## 2015-12-25 DIAGNOSIS — I4891 Unspecified atrial fibrillation: Secondary | ICD-10-CM

## 2015-12-25 DIAGNOSIS — Z7901 Long term (current) use of anticoagulants: Secondary | ICD-10-CM | POA: Diagnosis not present

## 2015-12-25 LAB — POCT INR: INR: 3

## 2016-01-08 ENCOUNTER — Encounter (HOSPITAL_COMMUNITY): Payer: Medicare Other

## 2016-01-08 ENCOUNTER — Encounter (HOSPITAL_COMMUNITY): Payer: Medicare Other | Attending: Hematology & Oncology

## 2016-01-08 ENCOUNTER — Encounter (HOSPITAL_COMMUNITY): Payer: Self-pay

## 2016-01-08 VITALS — BP 166/84 | HR 97 | Temp 97.4°F | Resp 18

## 2016-01-08 DIAGNOSIS — C61 Malignant neoplasm of prostate: Secondary | ICD-10-CM | POA: Diagnosis not present

## 2016-01-08 DIAGNOSIS — C7951 Secondary malignant neoplasm of bone: Secondary | ICD-10-CM | POA: Diagnosis present

## 2016-01-08 LAB — COMPREHENSIVE METABOLIC PANEL
ALT: 17 U/L (ref 17–63)
AST: 17 U/L (ref 15–41)
Albumin: 3.5 g/dL (ref 3.5–5.0)
Alkaline Phosphatase: 76 U/L (ref 38–126)
Anion gap: 5 (ref 5–15)
BUN: 16 mg/dL (ref 6–20)
CO2: 28 mmol/L (ref 22–32)
Calcium: 9 mg/dL (ref 8.9–10.3)
Chloride: 102 mmol/L (ref 101–111)
Creatinine, Ser: 0.71 mg/dL (ref 0.61–1.24)
GFR calc Af Amer: >60 mL/min (ref >60)
GFR calc non Af Amer: >60 mL/min (ref >60)
Glucose, Bld: 191 mg/dL — ABNORMAL HIGH (ref 65–99)
Potassium: 3.5 mmol/L (ref 3.5–5.1)
Sodium: 135 mmol/L (ref 135–145)
Total Bilirubin: 1 mg/dL (ref 0.3–1.2)
Total Protein: 6.3 g/dL — ABNORMAL LOW (ref 6.5–8.1)

## 2016-01-08 LAB — CBC WITH DIFFERENTIAL/PLATELET
Basophils Absolute: 0 10*3/uL (ref 0.0–0.1)
Basophils Relative: 0 %
Eosinophils Absolute: 0.1 10*3/uL (ref 0.0–0.7)
Eosinophils Relative: 1 %
HCT: 38.6 % — ABNORMAL LOW (ref 39.0–52.0)
Hemoglobin: 13.6 g/dL (ref 13.0–17.0)
Lymphocytes Relative: 35 %
Lymphs Abs: 2.3 10*3/uL (ref 0.7–4.0)
MCH: 32 pg (ref 26.0–34.0)
MCHC: 35.2 g/dL (ref 30.0–36.0)
MCV: 90.8 fL (ref 78.0–100.0)
Monocytes Absolute: 0.7 10*3/uL (ref 0.1–1.0)
Monocytes Relative: 11 %
Neutro Abs: 3.6 10*3/uL (ref 1.7–7.7)
Neutrophils Relative %: 53 %
Platelets: 147 10*3/uL — ABNORMAL LOW (ref 150–400)
RBC: 4.25 MIL/uL (ref 4.22–5.81)
RDW: 13.2 % (ref 11.5–15.5)
WBC: 6.7 10*3/uL (ref 4.0–10.5)

## 2016-01-08 LAB — PSA: PSA: 0.01 ng/mL (ref 0.00–4.00)

## 2016-01-08 MED ORDER — DENOSUMAB 120 MG/1.7ML ~~LOC~~ SOLN
120.0000 mg | Freq: Once | SUBCUTANEOUS | Status: AC
  Administered 2016-01-08: 120 mg via SUBCUTANEOUS
  Filled 2016-01-08: qty 1.7

## 2016-01-08 NOTE — Progress Notes (Signed)
[name] presents today for injection per MD orders. Xgeva 120 mg  administered SQ in left Abdomen. Administration without incident. Patient tolerated well.    

## 2016-01-08 NOTE — Patient Instructions (Signed)
[facility] at [facility] Discharge Instructions  RECOMMENDATIONS MADE BY THE CONSULTANT AND ANY TEST RESULTS WILL BE SENT TO YOUR REFERRING PHYSICIAN.  Xgeva given today Follow up as scheduled  Thank you for choosing [facility] at [facility] to provide your oncology and hematology care.  To afford each patient quality time with our provider, please arrive at least 15 minutes before your scheduled appointment time.   Beginning January 23rd 2017 lab work for the Cancer Center will be done in the  Main lab at [facility] on 1st floor. If you have a lab appointment with the Cancer Center please come in thru the  Main Entrance and check in at the main information desk  You need to re-schedule your appointment should you arrive 10 or more minutes late.  We strive to give you quality time with our providers, and arriving late affects you and other patients whose appointments are after yours.  Also, if you no show three or more times for appointments you may be dismissed from the clinic at the providers discretion.     Again, thank you for choosing [facility].  Our hope is that these requests will decrease the amount of time that you wait before being seen by our physicians.       _____________________________________________________________  Should you have questions after your visit to [facility], please contact our office at [phone] between the hours of 8:30 a.m. and 4:30 p.m.  Voicemails left after 4:30 p.m. will not be returned until the following business day.  For prescription refill requests, have your pharmacy contact our office.         Resources For Cancer Patients and their Caregivers ? American Cancer Society: Can assist with transportation, wigs, general needs, runs Look Good Feel Better.        1-888-227-6333 ? Cancer Care: Provides financial assistance, online support groups, medication/co-pay  assistance.  1-800-813-HOPE (4673) ? [facility] Assists [place] cancer patients and their families through emotional , educational and financial support.  336-427-4357 ? [place] DSS Where to apply for food stamps, Medicaid and utility assistance. 336-342-1394 ? [facility]: Transportation to medical appointments. 336-347-2287 ? Social Security Administration: May apply for disability if have a Stage IV cancer. 336-342-7796 1-800-772-1213 ? [place] Aging, Disability and Transit Services: Assists with nutrition, care and transit needs. 336-349-2343  Cancer Center Support Programs: @10RELATIVEDAYS@ > Cancer Support Group  2nd Tuesday of the month 1pm-2pm, Journey Room  > Creative Journey  3rd Tuesday of the month 1130am-1pm, Journey Room  > Look Good Feel Better  1st Wednesday of the month 10am-12 noon, Journey Room (Call American Cancer Society to register 1-800-395-5775)    

## 2016-01-09 ENCOUNTER — Other Ambulatory Visit (HOSPITAL_COMMUNITY): Payer: Self-pay | Admitting: Pharmacist

## 2016-01-09 NOTE — Progress Notes (Signed)
Patient receiving Abiraterone on time from a mfg supplied program fulfilled through [facility]. Reports no problems and thankful for the program.  Received X geva  At visit on 01/08/16.  Also takes his prednisone as ordered.  BG was 191 which was better than the two previous visit results of 245 and 267.  Eating well and looks and feels well.

## 2016-01-13 DIAGNOSIS — I482 Chronic atrial fibrillation: Secondary | ICD-10-CM | POA: Diagnosis not present

## 2016-01-13 DIAGNOSIS — I10 Essential (primary) hypertension: Secondary | ICD-10-CM | POA: Diagnosis not present

## 2016-01-13 DIAGNOSIS — E1165 Type 2 diabetes mellitus with hyperglycemia: Secondary | ICD-10-CM | POA: Diagnosis not present

## 2016-01-13 DIAGNOSIS — C8580 Other specified types of non-Hodgkin lymphoma, unspecified site: Secondary | ICD-10-CM | POA: Diagnosis not present

## 2016-01-13 DIAGNOSIS — Z23 Encounter for immunization: Secondary | ICD-10-CM | POA: Diagnosis not present

## 2016-01-16 ENCOUNTER — Other Ambulatory Visit: Payer: Self-pay | Admitting: Cardiology

## 2016-01-21 ENCOUNTER — Other Ambulatory Visit (INDEPENDENT_AMBULATORY_CARE_PROVIDER_SITE_OTHER): Payer: Self-pay | Admitting: Internal Medicine

## 2016-01-21 ENCOUNTER — Ambulatory Visit (INDEPENDENT_AMBULATORY_CARE_PROVIDER_SITE_OTHER): Payer: Medicare Other | Admitting: Internal Medicine

## 2016-01-21 ENCOUNTER — Telehealth (INDEPENDENT_AMBULATORY_CARE_PROVIDER_SITE_OTHER): Payer: Self-pay | Admitting: *Deleted

## 2016-01-21 ENCOUNTER — Encounter (INDEPENDENT_AMBULATORY_CARE_PROVIDER_SITE_OTHER): Payer: Self-pay | Admitting: *Deleted

## 2016-01-21 ENCOUNTER — Encounter (INDEPENDENT_AMBULATORY_CARE_PROVIDER_SITE_OTHER): Payer: Self-pay | Admitting: Internal Medicine

## 2016-01-21 VITALS — BP 130/62 | HR 72 | Temp 97.9°F | Ht 70.0 in | Wt 184.3 lb

## 2016-01-21 DIAGNOSIS — R1310 Dysphagia, unspecified: Secondary | ICD-10-CM | POA: Insufficient documentation

## 2016-01-21 DIAGNOSIS — R1319 Other dysphagia: Secondary | ICD-10-CM

## 2016-01-21 NOTE — Telephone Encounter (Signed)
Patient is scheduled for EGD/ED 02/27/16 and needs to stop Warfarin 5 days prior -- please advise if this is ok. thanks

## 2016-01-21 NOTE — Patient Instructions (Signed)
EGD/ED. The risks and benefits such as perforation, bleeding, and infection were reviewed with the patient and is agreeable. 

## 2016-01-21 NOTE — Telephone Encounter (Signed)
OK to hold coumadin 5 days before procedure.  Resume night of procedure if OK with MD.

## 2016-01-21 NOTE — Progress Notes (Signed)
Subjective:    Patient ID: [name], male    DOB: [birth year], 82 y.o.   MRN: [id number]  HPIPresents today with c/o dysphagia.  His wife states he choked twice Sunday and one Saturday. Wife states the foods lodged. He finally drank a small amt of fluid and the bolus finally past. He says he has been chewing really good before he swallows now. Hx of same and underwent an EGD/ED years ago . Hx of non-Hodgkin's lymphoma and takes chemotherapy (po) and is followed by Dr. Whitney Muse.  Appetite has remained good. Denies acid reflux.  He has a BM daily. No melena or BRRB.  Hx of stage 4 prostate cancer with bone metastasis. Hx of atrial fib and takes Warfarin  Review of Systems Past Medical History:  Diagnosis Date  . Atrial fibrillation ([place])    Dr. Harl [facility]ie- [facility] follows saw 11'14  . Bladder stones    tx. with oral meds and antibiotics, now surgery planned  . Cataracts, both eyes    surgery planned May 2015  . Diabetes mellitus without complication ([place])   . GIST (gastrointestinal stromal tumor), malignant ([place]) 08/18/2011   Gastrointestinal stromal tumor, that is GIST, small bowel, 4.5 cm, intermediate prognostic grade found on the PET scan in the small bowel, accounting for that small bowel activity in October 2005 with resection by Dr. Margot Chimes, thus far without recurrence.   . Hypertension   . NHL (non-Hodgkin's lymphoma) ([place]) 08/18/2011   Diffuse large B-cell lymphoma, clinically stage IIIA, CD20 positive, status post cervical lymph node biopsy 08/03/2003 on the left. PET scan was also positive in the spleen and small bowel region, but bone marrow aspiration and biopsy were negative. So he essentially had stage IIIAs. He received R-CHOP x6 cycles with CR established by PET scan criteria on 11/23/2003 with no evidence for relapse th    Past Surgical History:  Procedure Laterality Date  . CATARACT EXTRACTION Right 08/2013  . CHOLECYSTECTOMY    . CYSTOSCOPY WITH LITHOLAPAXY N/A  08/10/2013   Procedure: CYSTOSCOPY WITH LITHOLAPAXY WITH [name];  Surgeon: [name], MD;  [facility];  Service: Urology;  Laterality: N/A;  . INCISION AND DRAINAGE PERIRECTAL ABSCESS    . LYMPH NODE DISSECTION Left    '05  . PORT-A-CATH REMOVAL    . PORTACATH PLACEMENT     insertion and removal -last chemotherapy 10 yrs ago  . TRANSURETHRAL RESECTION OF PROSTATE N/A 08/10/2013   Procedure: TRANSURETHRAL RESECTION OF THE PROSTATE WITH GYRUS INSTRUMENTS;  Surgeon: [name], MD;  Location: [facility];  Service: Urology;  Laterality: N/A;    No Known Allergies  Current Outpatient Prescriptions on File Prior to Visit  Medication Sig Dispense Refill  . abiraterone Acetate (ZYTIGA) 250 MG tablet Take 4 tablets (1,000 mg total) by mouth daily. Take on an empty stomach 1 hour before or 2 hours after a meal 120 tablet 6  . acetaminophen (TYLENOL) 500 MG tablet Take 500 mg by mouth every 6 (six) hours as needed for mild pain.     Marland Kitchen amitriptyline (ELAVIL) 25 MG tablet Take 25 mg by mouth at bedtime.    . Calcium Carb-Cholecalciferol 600-800 MG-UNIT TABS Take 1 capsule by mouth at bedtime.    . clotrimazole-betamethasone (LOTRISONE) cream     . Cranberry 200 MG CAPS Take 4,200 mg by mouth every morning.     . denosumab (XGEVA) 120 MG/1.7ML SOLN injection Inject 120 mg into the skin once.    . diltiazem (CARDIZEM)  90 MG tablet Take 1 tablet (90 mg total) by mouth 2 (two) times daily. 180 tablet 3  . Docusate Calcium (STOOL SOFTENER PO) Take 1 tablet by mouth 2 (two) times daily.     Marland Kitchen glipiZIDE (GLUCOTROL XL) 5 MG 24 hr tablet     . Leuprolide Acetate, 6 Month, (LUPRON DEPOT) 45 MG injection Inject 45 mg into the muscle every 6 (six) months.    Marland Kitchen lisinopril-hydrochlorothiazide (ZESTORETIC) 20-12.5 MG tablet Take 1 tablet by mouth daily. 90 tablet 3  . metFORMIN (GLUCOPHAGE) 500 MG tablet Take 500 mg by mouth 2 (two) times daily with a meal.     . nystatin (MYCOSTATIN) powder  Apply topically 4 (four) times daily.    Marland Kitchen omeprazole (PRILOSEC) 20 MG capsule Take 20 mg by mouth daily.     . potassium chloride (KLOR-CON M10) 10 MEQ tablet Take 1 tablet (10 mEq total) by mouth daily. 90 tablet 1  . predniSONE (DELTASONE) 5 MG tablet Take 1 tablet (5 mg total) by mouth 2 (two) times daily with a meal. 180 tablet 0  . SENNA PO Take 1 capsule by mouth as needed.    . warfarin (COUMADIN) 2.5 MG tablet Take as directed by Coumadin Clinic. (Patient taking differently: 5mg  every day except M and Friday and he takes 2.5mg ) 160 tablet 0   No current facility-administered medications on file prior to visit.         Objective:   Physical Exam Blood pressure 130/62, pulse 72, temperature 97.9 F (36.6 C), height 5\' 10"  (1.778 m), weight 184 lb 4.8 oz (83.6 kg). Alert and oriented. Skin warm and dry. Oral mucosa is moist.   . Sclera anicteric, conjunctivae is pink. Thyroid not enlarged. No cervical lymphadenopathy. Lungs clear. Heart regular rate and rhythm.  Abdomen is soft. Bowel sounds are positive. No hepatomegaly. No abdominal masses felt. No tenderness. 1+ edema to lower extremities.          Assessment & Plan:  Dysphagia. Hx of same. EGD/ED.  The risks and benefits such as perforation, bleeding, and infection were reviewed with the patient and is agreeable.

## 2016-01-22 ENCOUNTER — Ambulatory Visit (INDEPENDENT_AMBULATORY_CARE_PROVIDER_SITE_OTHER): Payer: Medicare Other | Admitting: *Deleted

## 2016-01-22 DIAGNOSIS — I4891 Unspecified atrial fibrillation: Secondary | ICD-10-CM

## 2016-01-22 DIAGNOSIS — Z5181 Encounter for therapeutic drug level monitoring: Secondary | ICD-10-CM

## 2016-01-22 DIAGNOSIS — Z7901 Long term (current) use of anticoagulants: Secondary | ICD-10-CM

## 2016-01-22 LAB — POCT INR: INR: 2.5

## 2016-01-23 ENCOUNTER — Other Ambulatory Visit (HOSPITAL_COMMUNITY): Payer: Self-pay | Admitting: Hematology & Oncology

## 2016-01-23 DIAGNOSIS — C61 Malignant neoplasm of prostate: Secondary | ICD-10-CM

## 2016-01-24 DIAGNOSIS — H35371 Puckering of macula, right eye: Secondary | ICD-10-CM | POA: Diagnosis not present

## 2016-01-27 ENCOUNTER — Other Ambulatory Visit (HOSPITAL_COMMUNITY): Payer: Self-pay

## 2016-01-27 DIAGNOSIS — C61 Malignant neoplasm of prostate: Secondary | ICD-10-CM

## 2016-01-27 MED ORDER — PREDNISONE 5 MG PO TABS: 5.0000 mg | ORAL_TABLET | Freq: Two times a day (BID) | ORAL | 0 refills | Status: DC

## 2016-01-27 NOTE — Telephone Encounter (Signed)
Received refill request from patients pharmacy for prednisone. Chart checked and refilled.

## 2016-01-31 ENCOUNTER — Other Ambulatory Visit (HOSPITAL_COMMUNITY): Payer: Self-pay | Admitting: Pharmacist

## 2016-01-31 NOTE — Progress Notes (Signed)
I spoke with [name]. He is taking his Abiraterone as prescribed 1000 mg ( 4 x 250 mg ) daily and prednisone.  He states no problems and is eating well. He gets his medicine from [facility] for free and is thankful for that service.

## 2016-02-05 ENCOUNTER — Encounter (HOSPITAL_COMMUNITY): Payer: Medicare Other | Attending: Hematology & Oncology

## 2016-02-05 ENCOUNTER — Encounter (HOSPITAL_COMMUNITY): Payer: Self-pay | Admitting: Oncology

## 2016-02-05 ENCOUNTER — Encounter (HOSPITAL_COMMUNITY): Payer: Medicare Other

## 2016-02-05 ENCOUNTER — Encounter (HOSPITAL_BASED_OUTPATIENT_CLINIC_OR_DEPARTMENT_OTHER): Payer: Medicare Other | Admitting: Oncology

## 2016-02-05 ENCOUNTER — Other Ambulatory Visit (HOSPITAL_COMMUNITY): Payer: Self-pay | Admitting: Oncology

## 2016-02-05 VITALS — BP 145/88 | HR 88 | Temp 97.9°F | Resp 18 | Wt 186.8 lb

## 2016-02-05 DIAGNOSIS — C61 Malignant neoplasm of prostate: Secondary | ICD-10-CM

## 2016-02-05 DIAGNOSIS — C7951 Secondary malignant neoplasm of bone: Secondary | ICD-10-CM

## 2016-02-05 LAB — CBC WITH DIFFERENTIAL/PLATELET
Basophils Absolute: 0 10*3/uL (ref 0.0–0.1)
Basophils Relative: 0 %
Eosinophils Absolute: 0 10*3/uL (ref 0.0–0.7)
Eosinophils Relative: 0 %
HCT: 40.4 % (ref 39.0–52.0)
Hemoglobin: 14.4 g/dL (ref 13.0–17.0)
Lymphocytes Relative: 28 %
Lymphs Abs: 2.2 10*3/uL (ref 0.7–4.0)
MCH: 32.6 pg (ref 26.0–34.0)
MCHC: 35.6 g/dL (ref 30.0–36.0)
MCV: 91.4 fL (ref 78.0–100.0)
Monocytes Absolute: 0.7 10*3/uL (ref 0.1–1.0)
Monocytes Relative: 9 %
Neutro Abs: 4.7 10*3/uL (ref 1.7–7.7)
Neutrophils Relative %: 62 %
Platelets: 159 10*3/uL (ref 150–400)
RBC: 4.42 MIL/uL (ref 4.22–5.81)
RDW: 13.2 % (ref 11.5–15.5)
WBC: 7.6 10*3/uL (ref 4.0–10.5)

## 2016-02-05 LAB — COMPREHENSIVE METABOLIC PANEL
ALT: 16 U/L — ABNORMAL LOW (ref 17–63)
AST: 15 U/L (ref 15–41)
Albumin: 3.7 g/dL (ref 3.5–5.0)
Alkaline Phosphatase: 74 U/L (ref 38–126)
Anion gap: 8 (ref 5–15)
BUN: 18 mg/dL (ref 6–20)
CO2: 26 mmol/L (ref 22–32)
Calcium: 9.4 mg/dL (ref 8.9–10.3)
Chloride: 100 mmol/L — ABNORMAL LOW (ref 101–111)
Creatinine, Ser: 0.87 mg/dL (ref 0.61–1.24)
GFR calc Af Amer: >60 mL/min (ref >60)
GFR calc non Af Amer: >60 mL/min (ref >60)
Glucose, Bld: 199 mg/dL — ABNORMAL HIGH (ref 65–99)
Potassium: 4.2 mmol/L (ref 3.5–5.1)
Sodium: 134 mmol/L — ABNORMAL LOW (ref 135–145)
Total Bilirubin: 1.1 mg/dL (ref 0.3–1.2)
Total Protein: 6.4 g/dL — ABNORMAL LOW (ref 6.5–8.1)

## 2016-02-05 LAB — PSA: PSA: 0.12 ng/mL (ref 0.00–4.00)

## 2016-02-05 MED ORDER — DENOSUMAB 120 MG/1.7ML ~~LOC~~ SOLN
120.0000 mg | Freq: Once | SUBCUTANEOUS | Status: AC
  Administered 2016-02-05: 120 mg via SUBCUTANEOUS
  Filled 2016-02-05: qty 1.7

## 2016-02-05 NOTE — Progress Notes (Signed)
[name] presents today for injection per MD orders. Xgeva120mg  administered SQ in right Abdomen. Administration without incident. Patient tolerated well.

## 2016-02-05 NOTE — Patient Instructions (Signed)
[facility] at [facility] Discharge Instructions  RECOMMENDATIONS MADE BY THE CONSULTANT AND ANY TEST RESULTS WILL BE SENT TO YOUR REFERRING PHYSICIAN.  Xgeva given today Follow up as scheduled  Thank you for choosing [facility] at [facility] to provide your oncology and hematology care.  To afford each patient quality time with our provider, please arrive at least 15 minutes before your scheduled appointment time.   Beginning January 23rd 2017 lab work for the Cancer Center will be done in the  Main lab at [facility] on 1st floor. If you have a lab appointment with the Cancer Center please come in thru the  Main Entrance and check in at the main information desk  You need to re-schedule your appointment should you arrive 10 or more minutes late.  We strive to give you quality time with our providers, and arriving late affects you and other patients whose appointments are after yours.  Also, if you no show three or more times for appointments you may be dismissed from the clinic at the providers discretion.     Again, thank you for choosing [facility].  Our hope is that these requests will decrease the amount of time that you wait before being seen by our physicians.       _____________________________________________________________  Should you have questions after your visit to [facility], please contact our office at [phone] between the hours of 8:30 a.m. and 4:30 p.m.  Voicemails left after 4:30 p.m. will not be returned until the following business day.  For prescription refill requests, have your pharmacy contact our office.         Resources For Cancer Patients and their Caregivers ? American Cancer Society: Can assist with transportation, wigs, general needs, runs Look Good Feel Better.        1-888-227-6333 ? Cancer Care: Provides financial assistance, online support groups, medication/co-pay  assistance.  1-800-813-HOPE (4673) ? [facility] Assists [place] cancer patients and their families through emotional , educational and financial support.  336-427-4357 ? [place] DSS Where to apply for food stamps, Medicaid and utility assistance. 336-342-1394 ? RCATS: Transportation to medical appointments. 336-347-2287 ? Social Security Administration: May apply for disability if have a Stage IV cancer. 336-342-7796 1-800-772-1213 ? [place] Aging, Disability and Transit Services: Assists with nutrition, care and transit needs. 336-349-2343  Cancer Center Support Programs: @10RELATIVEDAYS@ > Cancer Support Group  2nd Tuesday of the month 1pm-2pm, Journey Room  > Creative Journey  3rd Tuesday of the month 1130am-1pm, Journey Room  > Look Good Feel Better  1st Wednesday of the month 10am-12 noon, Journey Room (Call American Cancer Society to register 1-800-395-5775)    

## 2016-02-05 NOTE — Patient Instructions (Signed)
[place] at [facility] Discharge Instructions  RECOMMENDATIONS MADE BY THE CONSULTANT AND ANY TEST RESULTS WILL BE SENT TO YOUR REFERRING PHYSICIAN.  You were seen by Gershon Mussel today. Depo -Lupron next month Labs Monthly Xqeva monthly. Return in 2 months for follow up.  Thank you for choosing [place] at [facility] to provide your oncology and hematology care.  To afford each patient quality time with our provider, please arrive at least 15 minutes before your scheduled appointment time.   Beginning January 23rd 2017 lab work for the [name] will be done in the  Main lab at [facility] on 1st floor. If you have a lab appointment with the [facility] please come in thru the  Main Entrance and check in at the main information desk  You need to re-schedule your appointment should you arrive 10 or more minutes late.  We strive to give you quality time with our providers, and arriving late affects you and other patients whose appointments are after yours.  Also, if you no show three or more times for appointments you may be dismissed from the clinic at the providers discretion.     Again, thank you for choosing [facility].  Our hope is that these requests will decrease the amount of time that you wait before being seen by our physicians.       _____________________________________________________________  Should you have questions after your visit to [facility], please contact our office at [phone] between the hours of 8:30 a.m. and 4:30 p.m.  Voicemails left after 4:30 p.m. will not be returned until the following business day.  For prescription refill requests, have your pharmacy contact our office.         Resources For Cancer Patients and their Caregivers ? American Cancer Society: Can assist with transportation, wigs, general needs, runs Look Good Feel Better.        812-817-3813 ? Cancer  Care: Provides financial assistance, online support groups, medication/co-pay assistance.  1-800-813-HOPE 979 125 0763) ? [facility] Assists [place] cancer patients and their families through emotional , educational and financial support.  718-602-1190 ? [place] DSS Where to apply for food stamps, Medicaid and utility assistance. (480)594-5281 ? RCATS: Transportation to medical appointments. (586) 871-2713 ? Social Security Administration: May apply for disability if have a Stage IV cancer. 5860471165 (234)331-9221 ? [employer], Disability and Transit Services: Assists with nutrition, care and transit needs. [place] Support Programs: @10RELATIVEDAYS @ > Cancer Support Group  2nd Tuesday of the month 1pm-2pm, Journey Room  > Creative Journey  3rd Tuesday of the month 1130am-1pm, Journey Room  > Look Good Feel Better  1st Wednesday of the month 10am-12 noon, Journey Room (Call [place] to register [phone])

## 2016-02-05 NOTE — Assessment & Plan Note (Deleted)
Diffuse large B-cell lymphoma clinically stage IIIAS STATUS post cervical lymph node biopsy on 08/03/2003. His lymphoma CD20 positive and PET scan showed positivity in the spleen. Bone marrow aspiration and biopsy were negative however. He received R. CHOP x6 cycles with a complete remission established by PET scan criteria on 11/23/2003. He completed 6 full cycles total. He remains without disease recurrence. He remains free of B. Symptomatology.

## 2016-02-05 NOTE — Assessment & Plan Note (Addendum)
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 Began Zytiga with prednisone BID on 01/19/2015.  Remains on Depo-Lupron every 6 months and Xgeva every month.  Delton See is due today.  Last given on:  Oncology Flowsheet 01/08/2016  denosumab (XGEVA) [place] 120 mg   Depo-Lupron is every 6 months.  It was last given on:  Oncology Flowsheet 09/11/2015  leuprolide (LUPRON) IM 45 mg   Labs today: CBC diff, CMET, PSA.  I personally reviewed and went over laboratory results with the patient.  The results are noted within this dictation.  He reports 3 falls since his last visit.  All three were secondary to sudden positional changes.  Fortunately, he did not hurt himself.  Labs monthly: CMET  Labs in 2 months: CBC diff, CMET, PSA.  Return in 8 weeks for follow-up.

## 2016-02-05 NOTE — Progress Notes (Signed)
[name], MD [address] [place] [address]  Prostate cancer [facility]) - Plan: CBC with Differential, PSA, CBC with Differential, PSA  CURRENT THERAPY: Eligard every 6 months, Zytiga + Prednisone, and Xgeva monthly.  INTERVAL HISTORY: [name] 82 y.o. male returns for followup of Stage IV castration resistant prostate cancer, on Eligard every 6 months, Zytiga + Prednisone, and Xgeva monthly.  Complicated by significant vasomotor instability on full dose Casodex. AND Diffuse large B-cell lymphoma clinically stage IIIAS STATUS post cervical lymph node biopsy on 08/03/2003. His lymphoma CD20 positive and PET scan showed positivity in the spleen. Bone marrow aspiration and biopsy were negative however. He received R. CHOP x6 cycles with a complete remission established by PET scan criteria on 11/23/2003. He completed 6 full cycles total. He remains without disease recurrence. He remains free of B. Symptomatology. 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 Margot Chimes in October 2005   His appetite is "too good."  His weight is stable.  He continues with chronic dizziness.  He reports 3 falls since being seen last.  All three were secondary to sudden positional changes.  He fell forward on all three occassions.  Fortunately, he did not injure himself.  He notes that he feel once while bending over to hug/grasp his granddaughter.  He denies any backward falls.  He is planning to go to [place] in Jan 2018 for the winter.  His wife interjects "we will have to fight with our children before we go.  They don't wants [place] to drive all that way.  We fight about this every year."  Review of Systems  Constitutional: Negative.  Negative for chills, fever and weight loss.  HENT: Negative.   Eyes: Negative.   Respiratory: Negative.  Negative for cough.   Cardiovascular:  Negative.  Negative for chest pain.  Gastrointestinal: Negative.  Negative for abdominal pain, constipation, diarrhea, nausea and vomiting.  Genitourinary: Negative.   Musculoskeletal: Negative.   Skin: Negative.   Neurological: Positive for dizziness. Negative for weakness.  Endo/Heme/Allergies: Negative.  Does not bruise/bleed easily.  Psychiatric/Behavioral: Negative.     Past Medical History:  Diagnosis Date  . Atrial fibrillation ([place])    Dr. Harl Bowie- [facility] follows saw 11'14  . Bladder stones    tx. with oral meds and antibiotics, now surgery planned  . Cataracts, both eyes    surgery planned May 2015  . Diabetes mellitus without complication ([place])   . 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 Margot Chimes, thus far without recurrence.   . Hypertension   . NHL (non-Hodgkin's lymphoma) ([place]) 08/18/2011   Diffuse large B-cell lymphoma, clinically stage IIIA, CD20 positive, status post cervical lymph node biopsy 08/03/2003 on the left. PET scan was also positive in the spleen and small bowel region, but bone marrow aspiration and biopsy were negative. So he essentially had stage IIIAs. He received R-CHOP x6 cycles with CR established by PET scan criteria on 11/23/2003 with no evidence for relapse th    Past Surgical History:  Procedure Laterality Date  . CATARACT EXTRACTION Right 08/2013  . CHOLECYSTECTOMY    . CYSTOSCOPY WITH  LITHOLAPAXY N/A 08/10/2013   Procedure: CYSTOSCOPY WITH LITHOLAPAXY WITH [name];  Surgeon: [name], MD;  Location: [facility];  Service: Urology;  Laterality: N/A;  . INCISION AND DRAINAGE PERIRECTAL ABSCESS    . LYMPH NODE DISSECTION Left    '05  . PORT-A-CATH REMOVAL    . PORTACATH PLACEMENT     insertion and  removal -last chemotherapy 10 yrs ago  . TRANSURETHRAL RESECTION OF PROSTATE N/A 08/10/2013   Procedure: TRANSURETHRAL RESECTION OF THE PROSTATE WITH GYRUS INSTRUMENTS;  Surgeon: [name], MD;  Location: [facility];  Service: Urology;  Laterality: N/A;    History reviewed. No pertinent family history.  Social History   Social History  . Marital status: Married    Spouse name: N/A  . Number of children: N/A  . Years of education: N/A   Occupational History  . retired Retired    Hotel manager   Social History Main Topics  . Smoking status: Never Smoker  . Smokeless tobacco: Never Used  . Alcohol use No  . Drug use: No  . Sexual activity: Not Currently   Other Topics Concern  . None   Social History Narrative  . None     PHYSICAL EXAMINATION  ECOG PERFORMANCE STATUS: 1 - Symptomatic but completely ambulatory  Vitals:   02/05/16 1000  BP: (!) 145/88  Pulse: 88  Resp: 18  Temp: 97.9 F (36.6 C)    GENERAL:alert, no distress, well nourished, well developed, comfortable, cooperative, smiling and accompanied by wife. SKIN: skin color, texture, turgor are normal, no rashes or significant lesions, many SKs HEAD: Normocephalic, No masses, lesions, tenderness or abnormalities EYES: normal, EOMI, Conjunctiva are pink and non-injected EARS: External ears normal OROPHARYNX:lips, buccal mucosa, and tongue normal and mucous membranes are moist  NECK: supple, no adenopathy, thyroid normal size, non-tender, without nodularity, trachea midline LYMPH:  no palpable lymphadenopathy BREAST:not examined LUNGS: clear to auscultation  HEART: irregularly irregular ABDOMEN:abdomen soft and normal bowel sounds BACK: Back symmetric, no curvature. EXTREMITIES:less then 2 second capillary refill, no joint deformities, effusion, or inflammation, no skin discoloration, no cyanosis  NEURO: alert & oriented x 3 with fluent speech, no focal motor/sensory deficits, gait normal   LABORATORY  DATA: CBC    Component Value Date/Time   WBC 7.6 02/05/2016 0951   RBC 4.42 02/05/2016 0951   HGB 14.4 02/05/2016 0951   HCT 40.4 02/05/2016 0951   PLT 159 02/05/2016 0951   MCV 91.4 02/05/2016 0951   MCH 32.6 02/05/2016 0951   MCHC 35.6 02/05/2016 0951   RDW 13.2 02/05/2016 0951   LYMPHSABS 2.2 02/05/2016 0951   MONOABS 0.7 02/05/2016 0951   EOSABS 0.0 02/05/2016 0951   BASOSABS 0.0 02/05/2016 0951      Chemistry      Component Value Date/Time   NA 134 (L) 02/05/2016 0951   K 4.2 02/05/2016 0951   CL 100 (L) 02/05/2016 0951   CO2 26 02/05/2016 0951   BUN 18 02/05/2016 0951   CREATININE 0.87 02/05/2016 0951   CREATININE 1.14 07/27/2013 1004      Component Value Date/Time   CALCIUM 9.4 02/05/2016 0951   ALKPHOS 74 02/05/2016 0951   AST 15 02/05/2016 0951   ALT 16 (L) 02/05/2016 0951   BILITOT 1.1 02/05/2016 0951     Lab Results  Component Value Date   PSA 0.01 01/08/2016   PSA <0.01 11/13/2015   PSA <0.01 07/24/2015     PENDING LABS:   RADIOGRAPHIC STUDIES:  No results  found.   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 Began Zytiga with prednisone BID on 01/19/2015.  Remains on Depo-Lupron every 6 months and Xgeva every month.  Delton See is due today.  Last given on:  Oncology Flowsheet 01/08/2016  denosumab (XGEVA) [place] 120 mg   Depo-Lupron is every 6 months.  It was last given on:  Oncology Flowsheet 09/11/2015  leuprolide (LUPRON) IM 45 mg   Labs today: CBC diff, CMET, PSA.  I personally reviewed and went over laboratory results with the patient.  The results are noted within this dictation.  He reports 3 falls since his last visit.  All three were secondary to sudden positional changes.  Fortunately, he did not hurt himself.  Labs monthly: CMET  Labs in 2 months: CBC diff, CMET, PSA.  Return in 8 weeks for follow-up.   ORDERS PLACED FOR THIS ENCOUNTER: Orders Placed  This Encounter  Procedures  . CBC with Differential  . PSA  . CBC with Differential  . PSA    MEDICATIONS PRESCRIBED THIS ENCOUNTER: No orders of the defined types were placed in this encounter.   THERAPY PLAN:  Eligard every 6 months, Zytiga + Prednisone, and Xgeva monthly.  All questions were answered. The patient knows to call the clinic with any problems, questions or concerns. We can certainly see the patient much sooner if necessary.  Patient and plan discussed with Dr. Ancil Linsey and she is in agreement with the aforementioned.   This note is electronically signed by: Doy Mince 02/05/2016 10:58 AM

## 2016-02-25 DIAGNOSIS — B351 Tinea unguium: Secondary | ICD-10-CM | POA: Diagnosis not present

## 2016-02-25 DIAGNOSIS — E1142 Type 2 diabetes mellitus with diabetic polyneuropathy: Secondary | ICD-10-CM | POA: Diagnosis not present

## 2016-02-27 ENCOUNTER — Encounter (HOSPITAL_COMMUNITY)
Admission: RE | Disposition: A | Discharge status: Home / Self Care | Payer: Self-pay | Source: Ambulatory Visit | Attending: Internal Medicine

## 2016-02-27 ENCOUNTER — Ambulatory Visit (HOSPITAL_COMMUNITY)
Admission: RE | Admit: 2016-02-27 | Discharge: 2016-02-27 | Disposition: A | Discharge status: Home / Self Care | Payer: Medicare Other | Source: Ambulatory Visit | Attending: Internal Medicine | Admitting: Internal Medicine

## 2016-02-27 ENCOUNTER — Encounter (HOSPITAL_COMMUNITY): Payer: Self-pay

## 2016-02-27 DIAGNOSIS — E119 Type 2 diabetes mellitus without complications: Secondary | ICD-10-CM | POA: Diagnosis not present

## 2016-02-27 DIAGNOSIS — Z85028 Personal history of other malignant neoplasm of stomach: Secondary | ICD-10-CM | POA: Insufficient documentation

## 2016-02-27 DIAGNOSIS — K5730 Diverticulosis of large intestine without perforation or abscess without bleeding: Secondary | ICD-10-CM | POA: Diagnosis not present

## 2016-02-27 DIAGNOSIS — K317 Polyp of stomach and duodenum: Secondary | ICD-10-CM | POA: Insufficient documentation

## 2016-02-27 DIAGNOSIS — K3189 Other diseases of stomach and duodenum: Secondary | ICD-10-CM | POA: Diagnosis not present

## 2016-02-27 DIAGNOSIS — I10 Essential (primary) hypertension: Secondary | ICD-10-CM | POA: Insufficient documentation

## 2016-02-27 DIAGNOSIS — R1314 Dysphagia, pharyngoesophageal phase: Secondary | ICD-10-CM | POA: Insufficient documentation

## 2016-02-27 DIAGNOSIS — Z79899 Other long term (current) drug therapy: Secondary | ICD-10-CM | POA: Diagnosis not present

## 2016-02-27 DIAGNOSIS — K766 Portal hypertension: Secondary | ICD-10-CM | POA: Diagnosis not present

## 2016-02-27 DIAGNOSIS — Q396 Congenital diverticulum of esophagus: Secondary | ICD-10-CM | POA: Diagnosis not present

## 2016-02-27 DIAGNOSIS — Z8572 Personal history of non-Hodgkin lymphomas: Secondary | ICD-10-CM | POA: Diagnosis not present

## 2016-02-27 DIAGNOSIS — K222 Esophageal obstruction: Secondary | ICD-10-CM | POA: Insufficient documentation

## 2016-02-27 DIAGNOSIS — K449 Diaphragmatic hernia without obstruction or gangrene: Secondary | ICD-10-CM | POA: Insufficient documentation

## 2016-02-27 DIAGNOSIS — R1319 Other dysphagia: Secondary | ICD-10-CM | POA: Insufficient documentation

## 2016-02-27 DIAGNOSIS — Z7984 Long term (current) use of oral hypoglycemic drugs: Secondary | ICD-10-CM | POA: Diagnosis not present

## 2016-02-27 DIAGNOSIS — R1310 Dysphagia, unspecified: Secondary | ICD-10-CM | POA: Insufficient documentation

## 2016-02-27 DIAGNOSIS — Z7901 Long term (current) use of anticoagulants: Secondary | ICD-10-CM | POA: Diagnosis not present

## 2016-02-27 DIAGNOSIS — Q394 Esophageal web: Secondary | ICD-10-CM | POA: Diagnosis not present

## 2016-02-27 DIAGNOSIS — K219 Gastro-esophageal reflux disease without esophagitis: Secondary | ICD-10-CM | POA: Insufficient documentation

## 2016-02-27 DIAGNOSIS — I4891 Unspecified atrial fibrillation: Secondary | ICD-10-CM | POA: Insufficient documentation

## 2016-02-27 HISTORY — PX: ESOPHAGOGASTRODUODENOSCOPY: SHX5428

## 2016-02-27 HISTORY — PX: ESOPHAGEAL DILATION: SHX303

## 2016-02-27 LAB — GLUCOSE, CAPILLARY: Glucose-Capillary: 174 mg/dL — ABNORMAL HIGH (ref 65–99)

## 2016-02-27 SURGERY — EGD (ESOPHAGOGASTRODUODENOSCOPY)
Anesthesia: Moderate Sedation

## 2016-02-27 MED ORDER — MEPERIDINE HCL 50 MG/ML IJ SOLN
INTRAMUSCULAR | Status: DC | PRN
  Administered 2016-02-27: 25 mg via INTRAVENOUS

## 2016-02-27 MED ORDER — MEPERIDINE HCL 50 MG/ML IJ SOLN
INTRAMUSCULAR | Status: DC
Start: 2016-02-27 — End: 2016-02-27
  Filled 2016-02-27: qty 1

## 2016-02-27 MED ORDER — MIDAZOLAM HCL 5 MG/5ML IJ SOLN
INTRAMUSCULAR | Status: DC | PRN
  Administered 2016-02-27 (×3): 1 mg via INTRAVENOUS

## 2016-02-27 MED ORDER — WARFARIN SODIUM 2.5 MG PO TABS: ORAL_TABLET | ORAL | 0 refills | Status: DC

## 2016-02-27 MED ORDER — MIDAZOLAM HCL 5 MG/5ML IJ SOLN
INTRAMUSCULAR | Status: AC
  Filled 2016-02-27: qty 10

## 2016-02-27 MED ORDER — SODIUM CHLORIDE 0.9 % IV SOLN
INTRAVENOUS | Status: DC
  Administered 2016-02-27: 20 mL/h via INTRAVENOUS

## 2016-02-27 MED ORDER — BUTAMBEN-TETRACAINE-BENZOCAINE 2-2-14 % EX AERO
INHALATION_SPRAY | CUTANEOUS | Status: DC | PRN
  Administered 2016-02-27: 2 via TOPICAL

## 2016-02-27 MED ORDER — SIMETHICONE 40 MG/0.6ML PO SUSP
ORAL | Status: DC | PRN
  Administered 2016-02-27: 2.5 mL

## 2016-02-27 NOTE — Discharge Instructions (Signed)
Resume warfarin on 02/28/2016 at usual dose. Resume other medications as before. Soft diet for 48 hours. No driving for 24 hours. INR check in 7-10 days. Please call office with progress report in middle of next week.       Esophagogastroduodenoscopy, Care After Refer to this sheet in the next few weeks. These instructions provide you with information about caring for yourself after your procedure. Your health care provider may also give you more specific instructions. Your treatment has been planned according to current medical practices, but problems sometimes occur. Call your health care provider if you have any problems or questions after your procedure. WHAT TO EXPECT AFTER THE PROCEDURE After your procedure, it is typical to feel:  Soreness in your throat.  Pain with swallowing.  Sick to your stomach (nauseous).  Bloated.  Dizzy.  Fatigued. HOME CARE INSTRUCTIONS  Do not eat or drink anything until the numbing medicine (local anesthetic) has worn off and your gag reflex has returned. You will know that the local anesthetic has worn off when you can swallow comfortably.  Do not drive or operate machinery until directed by your health care provider.  Take medicines only as directed by your health care provider. SEEK MEDICAL CARE IF:   You cannot stop coughing.  You are not urinating at all or less than usual. SEEK IMMEDIATE MEDICAL CARE IF:  You have difficulty swallowing.  You cannot eat or drink.  You have worsening throat or chest pain.  You have dizziness or lightheadedness or you faint.  You have nausea or vomiting.  You have chills.  You have a fever.  You have severe abdominal pain.  You have black, tarry, or bloody stools.   This information is not intended to replace advice given to you by your health care provider. Make sure you discuss any questions you have with your health care provider.   Document Released: 03/23/2012 Document Revised:  04/27/2014 Document Reviewed: 03/23/2012 Elsevier Interactive Patient Education [employer].

## 2016-02-27 NOTE — H&P (Addendum)
[name] is an 82 y.o. male.   Chief Complaint: Patient is here for EGD and ED. HPI: She is 82 year old Caucasian male with multiple medical problems was chronic GERD and presents with intermittent solid food dysphagia. He's had symptoms for about 6 months. He has most difficulty with meat. Says since his problems started he is trying to chew his food and able to prevent foot impaction. Heartburn is well controlled with therapy. He says he has lost some weight voluntarily. Patient has been off warfarin for 5 days. Last EGD with ED was more than 15 years ago.   Past Medical History:  Diagnosis Date  . Atrial fibrillation ([place])    Dr. Harl Bowie- [facility] follows saw 11'14  . Bladder stones    tx. with oral meds and antibiotics, now surgery planned  . Cataracts, both eyes    surgery planned May 2015  . Diabetes mellitus without complication ([place])   . 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 Margot Chimes, thus far without recurrence.   . Hypertension   . NHL (non-Hodgkin's lymphoma) ([place]) 08/18/2011   Diffuse large B-cell lymphoma, clinically stage IIIA, CD20 positive, status post cervical lymph node biopsy 08/03/2003 on the left. PET scan was also positive in the spleen and small bowel region, but bone marrow aspiration and biopsy were negative. So he essentially had stage IIIAs. He received R-CHOP x6 cycles with CR established by PET scan criteria on 11/23/2003 with no evidence for relapse th    Past Surgical History:  Procedure Laterality Date  . CATARACT EXTRACTION Right 08/2013  . CHOLECYSTECTOMY    . CYSTOSCOPY WITH LITHOLAPAXY N/A 08/10/2013   Procedure: CYSTOSCOPY WITH LITHOLAPAXY WITH [name];  Surgeon: [name], MD;  Location: [facility];   Service: Urology;  Laterality: N/A;  . INCISION AND DRAINAGE PERIRECTAL ABSCESS    . LYMPH NODE DISSECTION Left    '05  . PORT-A-CATH REMOVAL    . PORTACATH PLACEMENT     insertion and removal -last chemotherapy 10 yrs ago  . TRANSURETHRAL RESECTION OF PROSTATE N/A 08/10/2013   Procedure: TRANSURETHRAL RESECTION OF THE PROSTATE WITH GYRUS INSTRUMENTS;  Surgeon: [name], MD;  Location: [facility];  Service: Urology;  Laterality: N/A;    History reviewed. No pertinent family history. Social History:  reports that he has never smoked. He has never used smokeless tobacco. He reports that he does not drink alcohol or use drugs.  Allergies: No Known Allergies  Medications Prior to Admission  Medication Sig Dispense Refill  . abiraterone Acetate (ZYTIGA) 250 MG tablet Take 4 tablets (1,000 mg total) by mouth daily. Take on an empty stomach 1 hour before or 2 hours after a meal 120 tablet 6  . acetaminophen (TYLENOL) 500 MG tablet Take 500-1,000 mg by mouth daily as needed for mild pain, fever or headache.    Marland Kitchen amitriptyline (ELAVIL) 25 MG tablet Take 25 mg by mouth at bedtime.    . Calcium Carb-Cholecalciferol 600-800 MG-UNIT TABS Take 1 capsule by mouth daily.     . Cholecalciferol (VITAMIN D3) 1000 units CAPS Take 1,000 Units by mouth daily.    . clotrimazole-betamethasone (LOTRISONE) cream Apply 1 application topically daily as needed (fungus).     . CRANBERRY PO Take 4,200 mg by mouth daily.    Marland Kitchen  denosumab (XGEVA) 120 MG/1.7ML SOLN injection Inject 120 mg into the skin every 30 (thirty) days.     Marland Kitchen diltiazem (CARDIZEM) 90 MG tablet Take 1 tablet (90 mg total) by mouth 2 (two) times daily. 180 tablet 3  . docusate sodium (COLACE) 100 MG capsule Take 100 mg by mouth daily as needed for mild constipation.    Marland Kitchen glipiZIDE (GLUCOTROL XL) 5 MG 24 hr tablet Take 5 mg by mouth daily with breakfast.     . Leuprolide Acetate, 6 Month, (LUPRON DEPOT) 45 MG injection Inject 45 mg into the muscle  every 6 (six) months.    Marland Kitchen lisinopril-hydrochlorothiazide (ZESTORETIC) 20-12.5 MG tablet Take 1 tablet by mouth daily. 90 tablet 3  . loperamide (IMODIUM) 2 MG capsule Take 2 mg by mouth as needed for diarrhea or loose stools.    . metFORMIN (GLUCOPHAGE) 500 MG tablet Take 500 mg by mouth 2 (two) times daily with a meal.     . nystatin (MYCOSTATIN) powder Apply topically 4 (four) times daily as needed (fungus).     Marland Kitchen omeprazole (PRILOSEC) 20 MG capsule Take 20 mg by mouth daily.     . potassium chloride (KLOR-CON M10) 10 MEQ tablet Take 1 tablet (10 mEq total) by mouth daily. 90 tablet 1  . predniSONE (DELTASONE) 5 MG tablet Take 1 tablet (5 mg total) by mouth 2 (two) times daily with a meal. 180 tablet 0  . sennosides-docusate sodium (SENOKOT-S) 8.6-50 MG tablet Take 1 tablet by mouth daily.    Marland Kitchen warfarin (COUMADIN) 2.5 MG tablet Take as directed by [facility]. (Patient taking differently: Take 5mg s daily on Sunday,Tuesday,Wednesday,Thursday,and Saturday Take 2.5mg  daily on Monday and Friday) 160 tablet 0    Results for orders placed or performed during the hospital encounter of 02/27/16 (from the past 48 hour(s))  Glucose, capillary     Status: Abnormal   Collection Time: 02/27/16 10:20 AM  Result Value Ref Range   Glucose-Capillary 174 (H) 65 - 99 mg/dL   No results found.  ROS  Blood pressure (!) 174/96, pulse 91, temperature 97.7 F (36.5 C), temperature source Oral, resp. rate (!) 21, height 5\' 10"  (1.778 m), weight 185 lb (83.9 kg), SpO2 100 %. Physical Exam  Constitutional: He appears well-developed and well-nourished.  HENT:  Mouth/Throat: Oropharynx is clear and moist.  Patient has upper dental plate and 5 teeth in lower jaw.  Eyes: Conjunctivae are normal. No scleral icterus.  Neck: No thyromegaly present.  Cardiovascular:  Irregular rhythm normal S1 and S2. No murmur or gallop noted.  Respiratory: Effort normal and breath sounds normal.  GI: Soft. He exhibits no  distension and no mass. There is no tenderness.  Musculoskeletal: He exhibits no edema.  Lymphadenopathy:    He has no cervical adenopathy.  Neurological: He is alert.  Skin: Skin is warm and dry.     Assessment/Plan Solid food dysphagia. Chronic GERD. EGD with ED.  Hildred Laser, MD 02/27/2016, 10:53 AM

## 2016-02-27 NOTE — Op Note (Signed)
[facility] Patient Name: [name] Procedure Date: 02/27/2016 10:51 AM MRN: [id number] Date of Birth: 1924/01/05 Attending MD: Hildred Laser , MD CSN: [id number] Age: 82 Admit Type: Outpatient Procedure:                Upper GI endoscopy Indications:              Esophageal dysphagia Providers:                Hildred Laser, MD, Lurline Del, RN, Purcell Nails. [place],                            [employer]chant [employer] officer Referring MD:             Rosita Fire, MD Medicines:                Cetacaine spray, Meperidine 25 mg IV, Midazolam 3                            mg IV Complications:            No immediate complications. Estimated Blood Loss:     Estimated blood loss was minimal. Procedure:                Pre-Anesthesia Assessment:                           - Prior to the procedure, a History and Physical                            was performed, and patient medications and                            allergies were reviewed. The patient's tolerance of                            previous anesthesia was also reviewed. The risks                            and benefits of the procedure and the sedation                            options and risks were discussed with the patient.                            All questions were answered, and informed consent                            was obtained. Prior Anticoagulants: The patient                            last took Coumadin (warfarin) 5 days prior to the                            procedure. ASA Grade Assessment: III - A patient  with severe systemic disease. After reviewing the                            risks and benefits, the patient was deemed in                            satisfactory condition to undergo the procedure.                           After obtaining informed consent, the endoscope was                            passed under direct vision. Throughout the                            procedure, the patient's  blood pressure, pulse, and                            oxygen saturations were monitored continuously. The                            [id number]) scope was introduced through the                            mouth, and advanced to the second part of duodenum.                            The upper GI endoscopy was accomplished without                            difficulty. The patient tolerated the procedure                            well. Scope In: 11:04:01 AM Scope Out: 11:14:42 AM Total Procedure Duration: 0 hours 10 minutes 41 seconds  Findings:      A web was found in the proximal esophagus.      A non-bleeding diverticulum with a small opening and no stigmata of       recent bleeding was found in the lower third of the esophagus.      One mild benign-appearing, intrinsic stenosis was found 37 to 40 cm from       the incisors. This measured 1.3 cm (inner diameter) x 3 cm (in length)       and was traversed.      A mild Schatzki ring (acquired) was found at the gastroesophageal       junction. The scope was withdrawn. Dilation was performed with a Maloney       dilator with mild resistance at 61 Fr. The dilation site was examined       and showed moderate improvement in luminal narrowing. Estimated blood       loss was minimal.      A 3 cm hiatal hernia was present.      Mild portal hypertensive gastropathy was found in the gastric fundus and       in the gastric body.  A few small sessile polyps were found in the gastric body.      The exam of the stomach was otherwise normal.      The duodenal bulb and second portion of the duodenum were normal. Impression:               - Web in the proximal esophagus. disrupted by                            passing 85 [place] Maloney dilator.                           - Small diverticulum in the lower third of the                            esophagus with these of food debris.                           - Benign-appearing esophageal  stenosis distal to                            diverticulum dilated by passing 54 [place] Maloney                            dilator.                           - Mild Schatzki ring. Dilated an disrupted by                            passing 27 [place] Maloney dilator.                           - 3 cm hiatal hernia.                           - Portal hypertensive gastropathy.                           - A few gastric polyps.                           - Normal duodenal bulb and second portion of the                            duodenum.                           - No specimens collected. Moderate Sedation:      Moderate (conscious) sedation was administered by the endoscopy nurse       and supervised by the endoscopist. The following parameters were       monitored: oxygen saturation, heart rate, blood pressure, CO2       capnography and response to care. Total physician intraservice time was       16 minutes. Recommendation:           - Patient has a contact number available for  emergencies. The signs and symptoms of potential                            delayed complications were discussed with the                            patient. Return to normal activities tomorrow.                            Written discharge instructions were provided to the                            patient.                           - Mechanical soft diet for 2 days.                           - Continue present medications.                           - Resume Coumadin (warfarin) at prior dose tomorrow.                           - INR checked in 7-10 days.                           - Please call office with progress report next week. Procedure Code(s):        --- Professional ---                           [phone], Esophagogastroduodenoscopy, flexible,                            transoral; diagnostic, including collection of                            specimen(s) by brushing or washing, when  performed                            (separate procedure)                           43450, Dilation of esophagus, by unguided sound or                            bougie, single or multiple passes                           99152, Moderate sedation services provided by the                            same physician or other qualified health care                            professional performing the diagnostic  or                            therapeutic service that the sedation supports,                            requiring the presence of an independent trained                            observer to assist in the monitoring of the                            patient's level of consciousness and physiological                            status; initial 15 minutes of intraservice time,                            patient age 66 years or older Diagnosis Code(s):        --- Professional ---                           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 All rights reserved. The codes documented in this report are preliminary and upon coder review may  be revised to meet current compliance requirements. Hildred Laser, MD Hildred Laser, MD 02/27/2016 11:31:40 AM This report has been signed electronically. Number of Addenda: 0

## 2016-03-03 ENCOUNTER — Encounter (HOSPITAL_COMMUNITY): Payer: Self-pay | Admitting: Internal Medicine

## 2016-03-04 ENCOUNTER — Ambulatory Visit (INDEPENDENT_AMBULATORY_CARE_PROVIDER_SITE_OTHER): Payer: Medicare Other | Admitting: *Deleted

## 2016-03-04 ENCOUNTER — Encounter (HOSPITAL_COMMUNITY): Payer: Medicare Other | Attending: Hematology & Oncology

## 2016-03-04 ENCOUNTER — Encounter (HOSPITAL_COMMUNITY): Payer: Self-pay

## 2016-03-04 VITALS — BP 158/88 | HR 98 | Temp 97.9°F | Resp 18

## 2016-03-04 DIAGNOSIS — Z5181 Encounter for therapeutic drug level monitoring: Secondary | ICD-10-CM

## 2016-03-04 DIAGNOSIS — Z7901 Long term (current) use of anticoagulants: Secondary | ICD-10-CM | POA: Diagnosis not present

## 2016-03-04 DIAGNOSIS — C61 Malignant neoplasm of prostate: Secondary | ICD-10-CM | POA: Insufficient documentation

## 2016-03-04 DIAGNOSIS — C7951 Secondary malignant neoplasm of bone: Secondary | ICD-10-CM

## 2016-03-04 DIAGNOSIS — I4891 Unspecified atrial fibrillation: Secondary | ICD-10-CM

## 2016-03-04 LAB — COMPREHENSIVE METABOLIC PANEL
ALT: 15 U/L — ABNORMAL LOW (ref 17–63)
AST: 16 U/L (ref 15–41)
Albumin: 3.8 g/dL (ref 3.5–5.0)
Alkaline Phosphatase: 89 U/L (ref 38–126)
Anion gap: 9 (ref 5–15)
BUN: 17 mg/dL (ref 6–20)
CO2: 28 mmol/L (ref 22–32)
Calcium: 9.4 mg/dL (ref 8.9–10.3)
Chloride: 99 mmol/L — ABNORMAL LOW (ref 101–111)
Creatinine, Ser: 0.9 mg/dL (ref 0.61–1.24)
GFR calc Af Amer: >60 mL/min (ref >60)
GFR calc non Af Amer: >60 mL/min (ref >60)
Glucose, Bld: 243 mg/dL — ABNORMAL HIGH (ref 65–99)
Potassium: 3.5 mmol/L (ref 3.5–5.1)
Sodium: 136 mmol/L (ref 135–145)
Total Bilirubin: 1.1 mg/dL (ref 0.3–1.2)
Total Protein: 7 g/dL (ref 6.5–8.1)

## 2016-03-04 LAB — PSA: PSA: 0.06 ng/mL (ref 0.00–4.00)

## 2016-03-04 LAB — POCT INR: INR: 1.4

## 2016-03-04 MED ORDER — DENOSUMAB 120 MG/1.7ML ~~LOC~~ SOLN
120.0000 mg | Freq: Once | SUBCUTANEOUS | Status: AC
  Administered 2016-03-04: 120 mg via SUBCUTANEOUS
  Filled 2016-03-04: qty 1.7

## 2016-03-04 NOTE — Progress Notes (Signed)
[name] presents today for injection per MD orders. Xgeva 120mg  administered SQ in left Abdomen. Administration without incident. Patient tolerated well. Vitals stable and discharged ambulatory from clinic. Follow up as scheduled.

## 2016-03-04 NOTE — Patient Instructions (Signed)
[place] at [facility] Discharge Instructions  RECOMMENDATIONS MADE BY THE CONSULTANT AND ANY TEST RESULTS WILL BE SENT TO YOUR REFERRING PHYSICIAN.  X-Geva injection today. Follow up as scheduled.  Thank you for choosing [place] at [facility] to provide your oncology and hematology care.  To afford each patient quality time with our provider, please arrive at least 15 minutes before your scheduled appointment time.   Beginning January 23rd 2017 lab work for the [name] will be done in the  Main lab at [facility] on 1st floor. If you have a lab appointment with the [facility] please come in thru the  Main Entrance and check in at the main information desk  You need to re-schedule your appointment should you arrive 10 or more minutes late.  We strive to give you quality time with our providers, and arriving late affects you and other patients whose appointments are after yours.  Also, if you no show three or more times for appointments you may be dismissed from the clinic at the providers discretion.     Again, thank you for choosing [facility].  Our hope is that these requests will decrease the amount of time that you wait before being seen by our physicians.       _____________________________________________________________  Should you have questions after your visit to [facility], please contact our office at [phone] between the hours of 8:30 a.m. and 4:30 p.m.  Voicemails left after 4:30 p.m. will not be returned until the following business day.  For prescription refill requests, have your pharmacy contact our office.         Resources For Cancer Patients and their Caregivers ? American Cancer Society: Can assist with transportation, wigs, general needs, runs Look Good Feel Better.        903-004-3538 ? Cancer Care: Provides financial assistance, online support groups,  medication/co-pay assistance.  1-800-813-HOPE (707) 546-4173) ? [place] Assists [place] cancer patients and their families through emotional , educational and financial support.  581 616 5101 ? [place] DSS Where to apply for food stamps, Medicaid and utility assistance. 8315878470 ? [employer]: Transportation to medical appointments. 269-307-9644 ? Social Security Administration: May apply for disability if have a Stage IV cancer. 856-739-2342 4507945040 ? [employer], Disability and Transit Services: Assists with nutrition, care and transit needs. [place] Support Programs: @10RELATIVEDAYS @ > Cancer Support Group  2nd Tuesday of the month 1pm-2pm, Journey Room  > Creative Journey  3rd Tuesday of the month 1130am-1pm, Journey Room  > Look Good Feel Better  1st Wednesday of the month 10am-12 noon, Journey Room (Call [place] to register [phone])

## 2016-03-16 ENCOUNTER — Encounter (HOSPITAL_COMMUNITY): Payer: Self-pay

## 2016-03-16 ENCOUNTER — Encounter (HOSPITAL_BASED_OUTPATIENT_CLINIC_OR_DEPARTMENT_OTHER): Payer: Medicare Other

## 2016-03-16 ENCOUNTER — Other Ambulatory Visit (HOSPITAL_COMMUNITY): Payer: Self-pay | Admitting: Oncology

## 2016-03-16 ENCOUNTER — Ambulatory Visit (INDEPENDENT_AMBULATORY_CARE_PROVIDER_SITE_OTHER): Payer: Medicare Other | Admitting: *Deleted

## 2016-03-16 VITALS — BP 150/90 | HR 97 | Temp 98.1°F | Resp 18

## 2016-03-16 DIAGNOSIS — I4891 Unspecified atrial fibrillation: Secondary | ICD-10-CM

## 2016-03-16 DIAGNOSIS — C7951 Secondary malignant neoplasm of bone: Secondary | ICD-10-CM | POA: Diagnosis not present

## 2016-03-16 DIAGNOSIS — Z7901 Long term (current) use of anticoagulants: Secondary | ICD-10-CM | POA: Diagnosis not present

## 2016-03-16 DIAGNOSIS — C61 Malignant neoplasm of prostate: Secondary | ICD-10-CM

## 2016-03-16 DIAGNOSIS — Z5111 Encounter for antineoplastic chemotherapy: Secondary | ICD-10-CM

## 2016-03-16 DIAGNOSIS — Z5181 Encounter for therapeutic drug level monitoring: Secondary | ICD-10-CM | POA: Diagnosis not present

## 2016-03-16 LAB — POCT INR: INR: 3.2

## 2016-03-16 MED ORDER — LEUPROLIDE ACETATE (6 MONTH) 45 MG IM KIT
45.0000 mg | PACK | Freq: Once | INTRAMUSCULAR | Status: AC
  Administered 2016-03-16: 45 mg via INTRAMUSCULAR
  Filled 2016-03-16: qty 45

## 2016-03-16 NOTE — Patient Instructions (Signed)
[place] at [facility] Discharge Instructions  RECOMMENDATIONS MADE BY THE CONSULTANT AND ANY TEST RESULTS WILL BE SENT TO YOUR REFERRING PHYSICIAN.  Lupron injection today. Lupron injection in 6 months. Xgeva injection as scheduled next month. Return as scheduled for lab work and office visit.   Thank you for choosing [place] at [facility] to provide your oncology and hematology care.  To afford each patient quality time with our provider, please arrive at least 15 minutes before your scheduled appointment time.   Beginning January 23rd 2017 lab work for the [name] will be done in the  Main lab at [facility] on 1st floor. If you have a lab appointment with the [facility] please come in thru the  Main Entrance and check in at the main information desk  You need to re-schedule your appointment should you arrive 10 or more minutes late.  We strive to give you quality time with our providers, and arriving late affects you and other patients whose appointments are after yours.  Also, if you no show three or more times for appointments you may be dismissed from the clinic at the providers discretion.     Again, thank you for choosing [facility].  Our hope is that these requests will decrease the amount of time that you wait before being seen by our physicians.       _____________________________________________________________  Should you have questions after your visit to [facility], please contact our office at [phone] between the hours of 8:30 a.m. and 4:30 p.m.  Voicemails left after 4:30 p.m. will not be returned until the following business day.  For prescription refill requests, have your pharmacy contact our office.         Resources For Cancer Patients and their Caregivers ? American Cancer Society: Can assist with transportation, wigs, general needs, runs Look Good Feel Better.         438-033-8534 ? Cancer Care: Provides financial assistance, online support groups, medication/co-pay assistance.  1-800-813-HOPE 4056844948) ? [place] Assists [place] cancer patients and their families through emotional , educational and financial support.  (786) 217-2483 ? [place] DSS Where to apply for food stamps, Medicaid and utility assistance. (343) 416-1508 ? RCATS: Transportation to medical appointments. (343) 428-8922 ? Social Security Administration: May apply for disability if have a Stage IV cancer. 713-313-4192 7721072400 ? [employer], Disability and Transit Services: Assists with nutrition, care and transit needs. [place] Support Programs: @10RELATIVEDAYS @ > Cancer Support Group  2nd Tuesday of the month 1pm-2pm, Journey Room  > Creative Journey  3rd Tuesday of the month 1130am-1pm, Journey Room  > Look Good Feel Better  1st Wednesday of the month 10am-12 noon, Journey Room (Call [place] to register [phone])

## 2016-03-16 NOTE — Progress Notes (Signed)
[name] presents today for injection per the provider's orders.  Lupron administration without incident; see MAR for injection details.  Patient tolerated procedure well and without incident.  No questions or complaints noted at this time.

## 2016-03-27 DIAGNOSIS — I10 Essential (primary) hypertension: Secondary | ICD-10-CM | POA: Diagnosis not present

## 2016-03-27 DIAGNOSIS — I482 Chronic atrial fibrillation: Secondary | ICD-10-CM | POA: Diagnosis not present

## 2016-03-27 DIAGNOSIS — E1165 Type 2 diabetes mellitus with hyperglycemia: Secondary | ICD-10-CM | POA: Diagnosis not present

## 2016-03-27 DIAGNOSIS — C8580 Other specified types of non-Hodgkin lymphoma, unspecified site: Secondary | ICD-10-CM | POA: Diagnosis not present

## 2016-03-31 NOTE — Progress Notes (Signed)
[name] Fire, MD [address] [place] [address]  Prostate cancer [facility])  CURRENT THERAPY: Eligard every 6 months, Zytiga + Prednisone, and Xgeva monthly.  INTERVAL HISTORY: [name] 82 y.o. male returns for followup of Stage IV castration resistant prostate cancer, on Eligard every 6 months, Zytiga + Prednisone, and Xgeva monthly.  Complicated by significant vasomotor instability on full dose Casodex. AND Diffuse large B-cell lymphoma clinically stage IIIAS STATUS post cervical lymph node biopsy on 08/03/2003. His lymphoma CD20 positive and PET scan showed positivity in the spleen. Bone marrow aspiration and biopsy were negative however. He received R. CHOP x6 cycles with a complete remission established by PET scan criteria on 11/23/2003. He completed 6 full cycles total. He remains without disease recurrence. He remains free of B. Symptomatology. 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 Margot Chimes in October 2005   His appetite is "too good."  His weight is stable.  He denies any new pains.  He continues to have dizziness with positional changes.  He denies any recent falls since being seen last.  He reports the same story to me that he did 2 months ago regarding a fall with his great-granddaughter.  He will be going to [facility] on 04/20/2016 and returning in April 2018, much like he did last year.  We will adjust his treatment plan accordingly.  He is concerned about his persistent hyperglycemia in the 200's.  Contributing to this is his treatment for prostate cancer which includes low-dose Prednisone.  He is advised that improve DM-control is important.  He saw Dr. Legrand Rams recently who adjusted his DM medications.  Review of Systems  Constitutional: Negative.  Negative for chills, fever and weight loss.  HENT: Negative.   Eyes: Negative.   Respiratory:  Negative.  Negative for cough.   Cardiovascular: Negative.  Negative for chest pain.  Gastrointestinal: Negative.  Negative for abdominal pain, constipation, diarrhea, nausea and vomiting.  Genitourinary: Negative.   Musculoskeletal: Positive for falls. Negative for back pain.  Skin: Negative.   Neurological: Positive for dizziness. Negative for weakness.  Endo/Heme/Allergies: Negative.  Does not bruise/bleed easily.  Psychiatric/Behavioral: Negative.     Past Medical History:  Diagnosis Date  . Atrial fibrillation ([place])    Dr. Harl Bowie- [facility] follows saw 11'14  . Bladder stones    tx. with oral meds and antibiotics, now surgery planned  . Cataracts, both eyes    surgery planned May 2015  . Diabetes mellitus without complication ([place])   . GIST (gastrointestinal stromal tumor), malignant ([place]) 08/18/2011   Gastrointestinal stromal tumor, that is GIST, small bowel, 4.5 cm, intermediate prognostic grade found on the PET scan in the small bowel, accounting for that small bowel activity in October 2005 with resection by Dr. Margot Chimes, thus far without recurrence.   . Hypertension   . NHL (non-Hodgkin's lymphoma) ([place]) 08/18/2011   Diffuse large B-cell lymphoma, clinically stage IIIA, CD20 positive, status post cervical lymph node biopsy 08/03/2003 on the left. PET scan was also positive in the spleen and small bowel region, but bone marrow aspiration and biopsy were negative. So he essentially had stage IIIAs. He received R-CHOP x6 cycles with CR established by PET scan criteria on 11/23/2003 with no evidence for relapse th    Past Surgical History:  Procedure Laterality Date  . CATARACT EXTRACTION Right 08/2013  .  CHOLECYSTECTOMY    . CYSTOSCOPY WITH LITHOLAPAXY N/A 08/10/2013   Procedure: CYSTOSCOPY WITH LITHOLAPAXY WITH [name];  Surgeon: [name], MD;  Location: [facility];  Service: Urology;  Laterality: N/A;  . ESOPHAGEAL DILATION N/A 02/27/2016   Procedure: ESOPHAGEAL DILATION;   Surgeon: [name], MD;  Location: [facility] SU[facility];  Service: Endoscopy;  Laterality: N/A;  . ESOPHAGOGASTRODUODENOSCOPY N/A 02/27/2016   Procedure: ESOPHAGOGASTRODUODENOSCOPY (EGD);  Surgeon: [name], MD;  Location: [facility];  Service: Endoscopy;  Laterality: N/A;  12:00  . INCISION AND DRAINAGE PERIRECTAL ABSCESS    . LYMPH NODE DISSECTION Left    '05  . PORT-A-CATH REMOVAL    . PORTACATH PLACEMENT     insertion and removal -last chemotherapy 10 yrs ago  . TRANSURETHRAL RESECTION OF PROSTATE N/A 08/10/2013   Procedure: TRANSURETHRAL RESECTION OF THE PROSTATE WITH GYRUS INSTRUMENTS;  Surgeon: [name], MD;  Location: [facility];  Service: Urology;  Laterality: N/A;    History reviewed. No pertinent family history.  Social History   Social History  . Marital status: Married    Spouse name: N/A  . Number of children: N/A  . Years of education: N/A   Occupational History  . retired Retired    Hotel manager   Social History Main Topics  . Smoking status: Never Smoker  . Smokeless tobacco: Never Used  . Alcohol use No  . Drug use: No  . Sexual activity: Not Currently   Other Topics Concern  . None   Social History Narrative  . None     PHYSICAL EXAMINATION  ECOG PERFORMANCE STATUS: 1 - Symptomatic but completely ambulatory  Vitals:   04/01/16 1259  BP: 129/79  Pulse: 75  Resp: 18    GENERAL:alert, no distress, well nourished, well developed, comfortable, cooperative, smiling and accompanied by wife. SKIN: skin color, texture, turgor are normal, no rashes or significant lesions, many SKs HEAD: Normocephalic, No masses, lesions, tenderness or abnormalities EYES: normal, EOMI, Conjunctiva are pink and non-injected EARS: External ears normal OROPHARYNX:lips, buccal mucosa, and tongue normal and mucous membranes are moist  NECK: supple, no adenopathy, thyroid normal size, non-tender, without nodularity, trachea midline LYMPH:  no palpable  lymphadenopathy BREAST:not examined LUNGS: clear to auscultation  HEART: irregularly irregular ABDOMEN:abdomen soft and normal bowel sounds BACK: Back symmetric, no curvature. EXTREMITIES:less then 2 second capillary refill, no joint deformities, effusion, or inflammation, no skin discoloration, no cyanosis  NEURO: alert & oriented x 3 with fluent speech, no focal motor/sensory deficits, gait normal   LABORATORY DATA: CBC    Component Value Date/Time   WBC 10.4 04/01/2016 1244   RBC 4.78 04/01/2016 1244   HGB 15.7 04/01/2016 1244   HCT 43.7 04/01/2016 1244   PLT 177 04/01/2016 1244   MCV 91.4 04/01/2016 1244   MCH 32.8 04/01/2016 1244   MCHC 35.9 04/01/2016 1244   RDW 13.2 04/01/2016 1244   LYMPHSABS 2.7 04/01/2016 1244   MONOABS 1.1 (H) 04/01/2016 1244   EOSABS 0.1 04/01/2016 1244   BASOSABS 0.0 04/01/2016 1244      Chemistry      Component Value Date/Time   NA 135 04/01/2016 1244   K 3.9 04/01/2016 1244   CL 96 (L) 04/01/2016 1244   CO2 28 04/01/2016 1244   BUN 14 04/01/2016 1244   CREATININE 0.80 04/01/2016 1244   CREATININE 1.14 07/27/2013 1004      Component Value Date/Time   CALCIUM 10.3 04/01/2016 1244   ALKPHOS 77 04/01/2016 1244  AST 16 04/01/2016 1244   ALT 19 04/01/2016 1244   BILITOT 1.0 04/01/2016 1244     Results for JACLYN, REDLICH (MRN [id number]) as of 04/01/2016 13:42  Ref. Range 04/01/2016 12:44  Glucose Latest Ref Range: 65 - 99 mg/dL 229 (H)    Lab Results  Component Value Date   PSA 0.06 03/04/2016   PSA 0.12 02/05/2016   PSA 0.01 01/08/2016     PENDING LABS:   RADIOGRAPHIC STUDIES:  No results found.   PATHOLOGY:    ASSESSMENT AND PLAN:  Prostate cancer ([place]) Stage IV stage IV prostate cancer with bone metastases. Began Zytiga with prednisone BID on 01/19/2015.  Remains on Depo-Lupron every 6 months and Xgeva every month.  Delton See is due today.  Last given on: Oncology Flowsheet 03/04/2016  denosumab (XGEVA)  120 mg      Depo-Lupron is every 6 months.  It was last given on: Oncology Flowsheet 03/16/2016  Leuprolide Acetate (6 Month) (LUPRON) IM 45 mg   Labs today: CBC diff, CMET, PSA.  I personally reviewed and went over laboratory results with the patient.  The results are noted within this dictation.  Hyperglycemia noted today at 229.  He is concerned about his persistent hyperglycemia.  No doubt that low-dose Prednisone is contributing, but improved DM control is imperative as Prednisone is part of his prostate cancer treatment.  He saw Dr. Legrand Rams recently who adjusted his DM medication(s).  I would recommend ongoing medical management of hyperglycemia and would not advise a change in Prednisone.  Chart reviewed.  Urology note from Dr. Diona Fanti is reviewed from 12/20/2015.  He is leaving for [place] on 04/20/2016 and will return to [place] ~ 07/19/16.  Therefore, we will perform lab work and Xgeva injection when he returns in April 2018.  Labs in April 2018: CBC diff, CMET, PSA.  Next Xgeva in April 2018.  Return in April 2018 for follow-up, labs, and Xgeva.  Lupron is due in May 2018.   ORDERS PLACED FOR THIS ENCOUNTER: No orders of the defined types were placed in this encounter.   MEDICATIONS PRESCRIBED THIS ENCOUNTER: No orders of the defined types were placed in this encounter.   THERAPY PLAN:  Eligard every 6 months, Zytiga + Prednisone, and Xgeva monthly.  All questions were answered. The patient knows to call the clinic with any problems, questions or concerns. We can certainly see the patient much sooner if necessary.  Patient and plan discussed with Dr. Ancil Linsey and she is in agreement with the aforementioned.   This note is electronically signed by: Doy Mince 04/01/2016 1:42 PM

## 2016-03-31 NOTE — Assessment & Plan Note (Addendum)
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 Began Zytiga with prednisone BID on 01/19/2015.  Remains on Depo-Lupron every 6 months and Xgeva every month.  Delton See is due today.  Last given on: Oncology Flowsheet 03/04/2016  denosumab (XGEVA) [place] 120 mg    Depo-Lupron is every 6 months.  It was last given on: Oncology Flowsheet 03/16/2016  Leuprolide Acetate (6 Month) (LUPRON) IM 45 mg   Labs today: CBC diff, CMET, PSA.  I personally reviewed and went over laboratory results with the patient.  The results are noted within this dictation.  Hyperglycemia noted today at 229.  He is concerned about his persistent hyperglycemia.  No doubt that low-dose Prednisone is contributing, but improved DM control is imperative as Prednisone is part of his prostate cancer treatment.  He saw Dr. Legrand Rams recently who adjusted his DM medication(s).  I would recommend ongoing medical management of hyperglycemia and would not advise a change in Prednisone.  Chart reviewed.  Urology note from Dr. Diona Fanti is reviewed from 12/20/2015.  He is leaving for [place] on 04/20/2016 and will return to [place] ~ 07/19/16.  Therefore, we will perform lab work and Xgeva injection when he returns in April 2018.  Labs in April 2018: CBC diff, CMET, PSA.  Next Xgeva in April 2018.  Return in April 2018 for follow-up, labs, and Xgeva.  Lupron is due in May 2018.

## 2016-04-01 ENCOUNTER — Other Ambulatory Visit (HOSPITAL_COMMUNITY): Payer: Medicare Other

## 2016-04-01 ENCOUNTER — Encounter (HOSPITAL_COMMUNITY): Payer: Medicare Other | Attending: Hematology & Oncology

## 2016-04-01 ENCOUNTER — Encounter (HOSPITAL_COMMUNITY): Payer: Medicare Other

## 2016-04-01 ENCOUNTER — Encounter (HOSPITAL_COMMUNITY): Payer: Self-pay | Admitting: Oncology

## 2016-04-01 ENCOUNTER — Encounter (HOSPITAL_BASED_OUTPATIENT_CLINIC_OR_DEPARTMENT_OTHER): Payer: Medicare Other | Admitting: Oncology

## 2016-04-01 DIAGNOSIS — C7951 Secondary malignant neoplasm of bone: Secondary | ICD-10-CM

## 2016-04-01 DIAGNOSIS — C61 Malignant neoplasm of prostate: Secondary | ICD-10-CM | POA: Diagnosis not present

## 2016-04-01 DIAGNOSIS — C8336 Diffuse large B-cell lymphoma, intrapelvic lymph nodes: Secondary | ICD-10-CM | POA: Diagnosis not present

## 2016-04-01 DIAGNOSIS — C49A3 Gastrointestinal stromal tumor of small intestine: Secondary | ICD-10-CM | POA: Diagnosis not present

## 2016-04-01 LAB — COMPREHENSIVE METABOLIC PANEL
ALT: 19 U/L (ref 17–63)
AST: 16 U/L (ref 15–41)
Albumin: 3.9 g/dL (ref 3.5–5.0)
Alkaline Phosphatase: 77 U/L (ref 38–126)
Anion gap: 11 (ref 5–15)
BUN: 14 mg/dL (ref 6–20)
CO2: 28 mmol/L (ref 22–32)
Calcium: 10.3 mg/dL (ref 8.9–10.3)
Chloride: 96 mmol/L — ABNORMAL LOW (ref 101–111)
Creatinine, Ser: 0.8 mg/dL (ref 0.61–1.24)
GFR calc Af Amer: >60 mL/min (ref >60)
GFR calc non Af Amer: >60 mL/min (ref >60)
Glucose, Bld: 229 mg/dL — ABNORMAL HIGH (ref 65–99)
Potassium: 3.9 mmol/L (ref 3.5–5.1)
Sodium: 135 mmol/L (ref 135–145)
Total Bilirubin: 1 mg/dL (ref 0.3–1.2)
Total Protein: 7.2 g/dL (ref 6.5–8.1)

## 2016-04-01 LAB — CBC WITH DIFFERENTIAL/PLATELET
Basophils Absolute: 0 10*3/uL (ref 0.0–0.1)
Basophils Relative: 0 %
Eosinophils Absolute: 0.1 10*3/uL (ref 0.0–0.7)
Eosinophils Relative: 1 %
HCT: 43.7 % (ref 39.0–52.0)
Hemoglobin: 15.7 g/dL (ref 13.0–17.0)
Lymphocytes Relative: 26 %
Lymphs Abs: 2.7 10*3/uL (ref 0.7–4.0)
MCH: 32.8 pg (ref 26.0–34.0)
MCHC: 35.9 g/dL (ref 30.0–36.0)
MCV: 91.4 fL (ref 78.0–100.0)
Monocytes Absolute: 1.1 10*3/uL — ABNORMAL HIGH (ref 0.1–1.0)
Monocytes Relative: 11 %
Neutro Abs: 6.5 10*3/uL (ref 1.7–7.7)
Neutrophils Relative %: 62 %
Platelets: 177 10*3/uL (ref 150–400)
RBC: 4.78 MIL/uL (ref 4.22–5.81)
RDW: 13.2 % (ref 11.5–15.5)
WBC: 10.4 10*3/uL (ref 4.0–10.5)

## 2016-04-01 LAB — PSA: PSA: 0.03 ng/mL (ref 0.00–4.00)

## 2016-04-01 MED ORDER — DENOSUMAB 120 MG/1.7ML ~~LOC~~ SOLN
120.0000 mg | Freq: Once | SUBCUTANEOUS | Status: AC
  Administered 2016-04-01: 120 mg via SUBCUTANEOUS
  Filled 2016-04-01: qty 1.7

## 2016-04-01 NOTE — Patient Instructions (Addendum)
[place] at [facility]  Discharge Instructions:   Exam and discussion by [name] today X-geva today Return 4/10 for x-geva and labs Return to see the doctor in 4 months  Lupron due may 18 Please call the clinic if you have any questions or concerns Please stop by and see Amy for your appointments.     _______________________________________________________________  Thank you for choosing [place] at [facility] to provide your oncology and hematology care.  To afford each patient quality time with our providers, please arrive at least 15 minutes before your scheduled appointment.  You need to re-schedule your appointment if you arrive 10 or more minutes late.  We strive to give you quality time with our providers, and arriving late affects you and other patients whose appointments are after yours.  Also, if you no show three or more times for appointments you may be dismissed from the clinic.  Again, thank you for choosing [place] at [place] hope is that these requests will allow you access to exceptional care and in a timely manner. _______________________________________________________________  If you have questions after your visit, please contact our office at [phone] between the hours of 8:30 a.m. and 5:00 p.m. Voicemails left after 4:30 p.m. will not be returned until the following business day. _______________________________________________________________  For prescription refill requests, have your pharmacy contact our office. _______________________________________________________________  Recommendations made by the consultant and any test results will be sent to your referring physician. _______________________________________________________________

## 2016-04-01 NOTE — Progress Notes (Signed)
[name] presents today for injection per the provider's orders.  X-geva in abdomen administration without incident; see MAR for injection details.  Patient tolerated procedure well and without incident.  No questions or complaints noted at this time.

## 2016-04-08 ENCOUNTER — Ambulatory Visit (INDEPENDENT_AMBULATORY_CARE_PROVIDER_SITE_OTHER): Payer: Medicare Other | Admitting: *Deleted

## 2016-04-08 DIAGNOSIS — Z7901 Long term (current) use of anticoagulants: Secondary | ICD-10-CM

## 2016-04-08 DIAGNOSIS — Z5181 Encounter for therapeutic drug level monitoring: Secondary | ICD-10-CM

## 2016-04-08 DIAGNOSIS — I4891 Unspecified atrial fibrillation: Secondary | ICD-10-CM

## 2016-04-08 LAB — POCT INR: INR: 3.2

## 2016-04-22 ENCOUNTER — Other Ambulatory Visit (HOSPITAL_COMMUNITY): Payer: Self-pay | Admitting: *Deleted

## 2016-04-22 DIAGNOSIS — C61 Malignant neoplasm of prostate: Secondary | ICD-10-CM

## 2016-04-22 MED ORDER — PREDNISONE 5 MG PO TABS: 5.0000 mg | ORAL_TABLET | Freq: Two times a day (BID) | ORAL | 0 refills | Status: DC

## 2016-04-29 ENCOUNTER — Other Ambulatory Visit (HOSPITAL_COMMUNITY): Payer: Medicare Other

## 2016-04-29 ENCOUNTER — Ambulatory Visit (HOSPITAL_COMMUNITY): Payer: Medicare Other

## 2016-04-29 DIAGNOSIS — L03114 Cellulitis of left upper limb: Secondary | ICD-10-CM | POA: Diagnosis not present

## 2016-05-05 ENCOUNTER — Telehealth (HOSPITAL_COMMUNITY): Payer: Self-pay | Admitting: Hematology & Oncology

## 2016-05-05 NOTE — Telephone Encounter (Signed)
FAXED ENROLLMENT FORM TO [employer]

## 2016-05-06 DIAGNOSIS — Z5181 Encounter for therapeutic drug level monitoring: Secondary | ICD-10-CM | POA: Diagnosis not present

## 2016-05-06 DIAGNOSIS — I4891 Unspecified atrial fibrillation: Secondary | ICD-10-CM | POA: Diagnosis not present

## 2016-05-06 DIAGNOSIS — Z7901 Long term (current) use of anticoagulants: Secondary | ICD-10-CM | POA: Diagnosis not present

## 2016-05-07 LAB — PROTIME-INR: INR: 2.6 — AB (ref 0.9–1.1)

## 2016-05-13 ENCOUNTER — Other Ambulatory Visit (HOSPITAL_COMMUNITY): Payer: Self-pay

## 2016-05-13 ENCOUNTER — Telehealth (HOSPITAL_COMMUNITY): Payer: Self-pay | Admitting: Hematology & Oncology

## 2016-05-13 ENCOUNTER — Telehealth (HOSPITAL_COMMUNITY): Payer: Self-pay

## 2016-05-13 ENCOUNTER — Telehealth (HOSPITAL_COMMUNITY): Payer: Self-pay | Admitting: *Deleted

## 2016-05-13 DIAGNOSIS — C8590 Non-Hodgkin lymphoma, unspecified, unspecified site: Secondary | ICD-10-CM

## 2016-05-13 DIAGNOSIS — C61 Malignant neoplasm of prostate: Secondary | ICD-10-CM

## 2016-05-13 DIAGNOSIS — C49A0 Gastrointestinal stromal tumor, unspecified site: Secondary | ICD-10-CM

## 2016-05-13 DIAGNOSIS — C7951 Secondary malignant neoplasm of bone: Secondary | ICD-10-CM

## 2016-05-13 MED ORDER — POTASSIUM CHLORIDE CRYS ER 10 MEQ PO TBCR: 10.0000 meq | EXTENDED_RELEASE_TABLET | Freq: Every day | ORAL | 1 refills | Status: DC

## 2016-05-13 NOTE — Telephone Encounter (Signed)
Patients daughter, [name] , called wanting to know what would happen if patient was unable to take Zytiga for 2-4 weeks. He is having trouble obtaining the medicine due to finances and is waiting for assistance to be approved. After reviewing with PA-C, explained to daughter that nothing bad should happen.

## 2016-05-13 NOTE — Telephone Encounter (Signed)
Returned pts daughters call re foundation help from [employer]. Advised her that application was not faxed until 05/05/16 and no new fin info was supplied by her parent so I faxed old info. Advised her that I faxed med list on the 22nd and they did not recv it. Faxed it again on 1/23. Per Ronny Bacon they rcvd the med list and it would still take 3.-5 days to process

## 2016-05-13 NOTE — Telephone Encounter (Signed)
Refill request received from patients pharmacy in [place]. Chart checked and refilled.

## 2016-05-20 ENCOUNTER — Telehealth (INDEPENDENT_AMBULATORY_CARE_PROVIDER_SITE_OTHER): Payer: Medicare Other | Admitting: *Deleted

## 2016-05-20 DIAGNOSIS — Z5181 Encounter for therapeutic drug level monitoring: Secondary | ICD-10-CM

## 2016-05-20 DIAGNOSIS — I4891 Unspecified atrial fibrillation: Secondary | ICD-10-CM

## 2016-05-20 NOTE — Telephone Encounter (Signed)
Called pt.  Discussed INR results with pt from 05/07/16.  See coumadin note.

## 2016-05-20 NOTE — Telephone Encounter (Signed)
Please give pt a call @ [phone]

## 2016-05-27 ENCOUNTER — Ambulatory Visit (HOSPITAL_COMMUNITY): Payer: Medicare Other

## 2016-05-27 ENCOUNTER — Other Ambulatory Visit (HOSPITAL_COMMUNITY): Payer: Medicare Other

## 2016-06-08 ENCOUNTER — Telehealth: Payer: Self-pay | Admitting: *Deleted

## 2016-06-08 DIAGNOSIS — I480 Paroxysmal atrial fibrillation: Secondary | ICD-10-CM

## 2016-06-08 NOTE — Telephone Encounter (Signed)
Tammy from [facility] in [place] called and is needing a standing order faxed for PT/INR to [phone]

## 2016-06-08 NOTE — Telephone Encounter (Signed)
Standing lab orders faxed to [facility] per their request.

## 2016-06-09 DIAGNOSIS — Z5181 Encounter for therapeutic drug level monitoring: Secondary | ICD-10-CM | POA: Diagnosis not present

## 2016-06-09 DIAGNOSIS — I4891 Unspecified atrial fibrillation: Secondary | ICD-10-CM | POA: Diagnosis not present

## 2016-06-09 DIAGNOSIS — Z7901 Long term (current) use of anticoagulants: Secondary | ICD-10-CM | POA: Diagnosis not present

## 2016-06-09 LAB — POCT INR: INR: 2.4

## 2016-06-10 ENCOUNTER — Ambulatory Visit (INDEPENDENT_AMBULATORY_CARE_PROVIDER_SITE_OTHER): Payer: Medicare Other | Admitting: *Deleted

## 2016-06-10 DIAGNOSIS — B351 Tinea unguium: Secondary | ICD-10-CM | POA: Diagnosis not present

## 2016-06-10 DIAGNOSIS — Z5181 Encounter for therapeutic drug level monitoring: Secondary | ICD-10-CM

## 2016-06-10 DIAGNOSIS — I739 Peripheral vascular disease, unspecified: Secondary | ICD-10-CM | POA: Diagnosis not present

## 2016-06-11 ENCOUNTER — Telehealth: Payer: Self-pay

## 2016-06-15 DIAGNOSIS — Z79899 Other long term (current) drug therapy: Secondary | ICD-10-CM | POA: Diagnosis not present

## 2016-06-15 DIAGNOSIS — Z7984 Long term (current) use of oral hypoglycemic drugs: Secondary | ICD-10-CM | POA: Diagnosis not present

## 2016-06-15 DIAGNOSIS — R310 Gross hematuria: Secondary | ICD-10-CM | POA: Diagnosis not present

## 2016-06-15 DIAGNOSIS — E119 Type 2 diabetes mellitus without complications: Secondary | ICD-10-CM | POA: Diagnosis not present

## 2016-06-24 ENCOUNTER — Other Ambulatory Visit (HOSPITAL_COMMUNITY): Payer: Medicare Other

## 2016-06-24 ENCOUNTER — Ambulatory Visit (HOSPITAL_COMMUNITY): Payer: Medicare Other

## 2016-07-21 ENCOUNTER — Other Ambulatory Visit: Payer: Self-pay | Admitting: Cardiology

## 2016-07-23 DIAGNOSIS — R300 Dysuria: Secondary | ICD-10-CM | POA: Diagnosis not present

## 2016-07-23 DIAGNOSIS — N390 Urinary tract infection, site not specified: Secondary | ICD-10-CM | POA: Diagnosis not present

## 2016-07-23 DIAGNOSIS — E1165 Type 2 diabetes mellitus with hyperglycemia: Secondary | ICD-10-CM | POA: Diagnosis not present

## 2016-07-27 ENCOUNTER — Ambulatory Visit (INDEPENDENT_AMBULATORY_CARE_PROVIDER_SITE_OTHER): Payer: Medicare Other | Admitting: *Deleted

## 2016-07-27 ENCOUNTER — Other Ambulatory Visit (HOSPITAL_COMMUNITY): Payer: Self-pay | Admitting: *Deleted

## 2016-07-27 DIAGNOSIS — C61 Malignant neoplasm of prostate: Secondary | ICD-10-CM

## 2016-07-27 DIAGNOSIS — I480 Paroxysmal atrial fibrillation: Secondary | ICD-10-CM | POA: Diagnosis not present

## 2016-07-27 DIAGNOSIS — Z5181 Encounter for therapeutic drug level monitoring: Secondary | ICD-10-CM

## 2016-07-27 LAB — POCT INR: INR: 2.3

## 2016-07-27 NOTE — Progress Notes (Signed)
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 male presents for routine follow-up for several malignancies including metastatic prostate cancer, and history of DLBCL and GIST of small bowel.   Overall, he reports feeling pretty well.  His biggest complaint is fatigue and dizziness.  Both of these issues have been longstanding.  His wife tells me that he takes several naps per day; he tells me he "sleeps like a log" and sleeps well at night.  With regards to the dizziness, this has been a chronic complaint for him. Does report that he does not drink enough water during the day.  He did fall once in the past 3 months; states that he walked too much and was trying to make it back to his truck "and my legs just gave out." Denies any serious injuries. He has a walker at home, but does not use it regularly.    Reports having incomplete bladder emptying with his first void each morning; nocturia x 2 each night; he has urge and stress urinary incontinence as well.  He sees Dr. Diona Fanti regularly and is due to see him sometime soon.    Remains on Coumadin; managed by cardiology. He was started on Metformin for his diabetes by his PCP and reports having diarrhea as a result.    He remains on his Zytiga with prednisone as prescribed. Continues to be concerned about elevated blood glucose from prednisone. His PCP is helping manage his hyperglycemia. He states that "sometimes I don't take the prednisone and my blood sugars are  better by about 20-30 points."  -Stressed the importance of him taking the prednisone with the Zytiga, as prescribed, as this is part of his treatment regimen for his prostate cancer.   Denies any new bone pain.  Virgel Paling monthly. Last Lupron injection was in 02/2016; he will be due in 08/2016. Reports hot flashes about 3-4 times/day. His wife reports that he's had a "quick temper" recently.     REVIEW OF SYSTEMS:  Review of Systems  Constitutional: Positive for fatigue. Negative for appetite change, chills and fever.  HENT:  Negative.   Eyes: Negative.   Respiratory: Negative.  Negative for cough and shortness of breath.   Cardiovascular: Negative.  Negative for chest pain.  Gastrointestinal: Positive for abdominal pain (intermittent abdominal pain ) and diarrhea. Negative for blood in stool, constipation, nausea and vomiting.  Endocrine: Positive for hot flashes (3-4 times/day).  Genitourinary: Positive for bladder incontinence and nocturia. Negative for dysuria and hematuria.   Musculoskeletal: Negative.   Skin: Negative.  Negative for rash.  Neurological: Positive for dizziness. Negative for headaches.  Hematological: Bruises/bleeds easily.  Psychiatric/Behavioral: Negative.      PAST MEDICAL/SURGICAL HISTORY:  Past Medical History:  Diagnosis Date  . Atrial fibrillation ([place])    Dr. Harl Bowie- [facility] follows saw 11'14  . Bladder stones    tx. with oral meds and antibiotics, now surgery planned  . Cataracts, both eyes    surgery planned May 2015  . Diabetes  mellitus without complication ([place])   . GIST (gastrointestinal stromal tumor), malignant ([place]) 08/18/2011   Gastrointestinal stromal tumor, that is GIST, small bowel, 4.5 cm, intermediate prognostic grade found on the PET scan in the small bowel, accounting for that small bowel activity in October 2005 with resection by Dr. Margot Chimes, thus far without recurrence.   . Hypertension   . NHL (non-Hodgkin's lymphoma) ([place])  08/18/2011   Diffuse large B-cell lymphoma, clinically stage IIIA, CD20 positive, status post cervical lymph node biopsy 08/03/2003 on the left. PET scan was also positive in the spleen and small bowel region, but bone marrow aspiration and biopsy were negative. So he essentially had stage IIIAs. He received R-CHOP x6 cycles with CR established by PET scan criteria on 11/23/2003 with no evidence for relapse th   Past Surgical History:  Procedure Laterality Date  . CATARACT EXTRACTION Right 08/2013  . CHOLECYSTECTOMY    . CYSTOSCOPY WITH LITHOLAPAXY N/A 08/10/2013   Procedure: CYSTOSCOPY WITH LITHOLAPAXY WITH [name];  Surgeon: [name], MD;  Location: [facility];  Service: Urology;  Laterality: N/A;  . ESOPHAGEAL DILATION N/A 02/27/2016   Procedure: ESOPHAGEAL DILATION;  Surgeon: [name], MD;  Location: [facility];  Service: Endoscopy;  Laterality: N/A;  . ESOPHAGOGASTRODUODENOSCOPY N/A 02/27/2016   Procedure: ESOPHAGOGASTRODUODENOSCOPY (EGD);  Surgeon: [name], MD;  Location[facility];  Service: Endoscopy;  Laterality: N/A;  12:00  . INCISION AND DRAINAGE PERIRECTAL ABSCESS    . LYMPH NODE DISSECTION Left    '05  . PORT-A-CATH REMOVAL    . PORTACATH PLACEMENT     insertion and removal -last chemotherapy 10 yrs ago  . TRANSURETHRAL RESECTION OF PROSTATE N/A 08/10/2013   Procedure: TRANSURETHRAL RESECTION OF THE PROSTATE WITH GYRUS INSTRUMENTS;  Surgeon: [name], MD;  Location: [facility];  Service: Urology;  Laterality: N/A;     SOCIAL HISTORY:  Social History   Social History  . Marital status: Married    Spouse name: N/A  . Number of children: N/A  . Years of education: N/A   Occupational History  . retired Retired    Hotel manager   Social History Main Topics  . Smoking status: Never Smoker  . Smokeless tobacco: Never Used  . Alcohol use No  . Drug use: No  . Sexual activity: Not Currently   Other Topics Concern  . Not on file   Social  History Narrative  . No narrative on file    FAMILY HISTORY:  History reviewed. No pertinent family history.  CURRENT MEDICATIONS:  Outpatient Encounter Prescriptions as of 07/28/2016  Medication Sig  . abiraterone Acetate (ZYTIGA) 250 MG tablet Take 4 tablets (1,000 mg total) by mouth daily. Take on an empty stomach 1 hour before or 2 hours after a meal  . acetaminophen (TYLENOL) 500 MG tablet Take 500-1,000 mg by mouth daily as needed for mild pain, fever or headache.  Marland Kitchen amitriptyline (ELAVIL) 25 MG tablet Take 25 mg by mouth at bedtime.  . Calcium Carb-Cholecalciferol 600-800 MG-UNIT TABS Take 1 capsule by mouth daily.   . Cholecalciferol (VITAMIN D3) 1000 units CAPS Take 1,000 Units by mouth daily.  . clotrimazole-betamethasone (LOTRISONE) cream Apply 1 application topically daily as needed (fungus).   . CRANBERRY PO Take 4,200 mg by mouth daily.  Marland Kitchen denosumab (XGEVA) 120 MG/1.7ML SOLN injection Inject 120 mg into the skin every 30 (thirty) days.   Marland Kitchen diltiazem (CARDIZEM) 90 MG tablet Take 1 tablet (90 mg total) by mouth 2 (  two) times daily.  Marland Kitchen docusate sodium (COLACE) 100 MG capsule Take 100 mg by mouth daily as needed for mild constipation.  Marland Kitchen glipiZIDE (GLUCOTROL XL) 5 MG 24 hr tablet Take 5 mg by mouth daily with breakfast.   . Leuprolide Acetate, 6 Month, (LUPRON DEPOT) 45 MG injection Inject 45 mg into the muscle every 6 (six) months.  Marland Kitchen lisinopril-hydrochlorothiazide (ZESTORETIC) 20-12.5 MG tablet Take 1 tablet by mouth daily.  Marland Kitchen loperamide (IMODIUM) 2 MG capsule Take 2 mg by mouth as needed for diarrhea or loose stools.  . metFORMIN (GLUCOPHAGE) 500 MG tablet Take 850 mg by mouth 2 (two) times daily with a meal.   . nystatin (MYCOSTATIN) powder Apply topically 4 (four) times daily as needed (fungus).   Marland Kitchen omeprazole (PRILOSEC) 20 MG capsule Take 20 mg by mouth daily.   . potassium chloride (KLOR-CON M10) 10 MEQ tablet Take 1 tablet (10 mEq total) by mouth daily.  . predniSONE  (DELTASONE) 5 MG tablet Take 1 tablet (5 mg total) by mouth 2 (two) times daily with a meal.  . sennosides-docusate sodium (SENOKOT-S) 8.6-50 MG tablet Take 1 tablet by mouth daily.  Marland Kitchen warfarin (COUMADIN) 2.5 MG tablet Take as directed by [facility] Clinic.  Marland Kitchen warfarin (COUMADIN) 2.5 MG tablet Take 2 tablets daily except 1 tablet on Mondays, Wednesdays and Fridays  . [EXPIRED] denosumab (XGEVA) injection 120 mg    No facility-administered encounter medications on file as of 07/28/2016.     ALLERGIES:  No Known Allergies   PHYSICAL EXAM:  ECOG Performance status: 1-2 - Symptomatic; requires occasional assistance, but is mostly independent.   Vitals:   07/28/16 1525  BP: (!) 153/72  Pulse: 100  Resp: 18   Filed Weights   07/28/16 1525  Weight: 180 lb 14.4 oz (82.1 kg)    Physical Exam  Constitutional: He is oriented to person, place, and time and well-developed, well-nourished, and in no distress.  HENT:  Head: Normocephalic.  Mouth/Throat: Oropharynx is clear and moist. No oropharyngeal exudate.  Eyes: Conjunctivae are normal. Pupils are equal, round, and reactive to light. No scleral icterus.  Neck: Normal range of motion. Neck supple.  Cardiovascular: Normal rate, regular rhythm and normal heart sounds.   Pulmonary/Chest: Effort normal and breath sounds normal. No respiratory distress. He has no wheezes.  Abdominal: Soft. Bowel sounds are normal. There is no tenderness.  Musculoskeletal: Normal range of motion. He exhibits edema (1+ BLE edema ).  Lymphadenopathy:    He has no cervical adenopathy.       Right: No supraclavicular adenopathy present.       Left: No supraclavicular adenopathy present.  Neurological: He is alert and oriented to person, place, and time. No cranial nerve deficit.  Slightly shuffling gait   Skin: Skin is warm and dry. No rash noted.  Psychiatric: Mood, memory, affect and judgment normal.  Nursing note and vitals reviewed.    LABORATORY DATA:    I have reviewed the labs as listed.  CBC    Component Value Date/Time   WBC 7.5 07/28/2016 1302   RBC 4.54 07/28/2016 1302   HGB 14.8 07/28/2016 1302   HCT 41.3 07/28/2016 1302   PLT 167 07/28/2016 1302   MCV 91.0 07/28/2016 1302   MCH 32.6 07/28/2016 1302   MCHC 35.8 07/28/2016 1302   RDW 13.0 07/28/2016 1302   LYMPHSABS 1.4 07/28/2016 1302   MONOABS 0.5 07/28/2016 1302   EOSABS 0.0 07/28/2016 1302   BASOSABS 0.0 07/28/2016 1302  CMP Latest Ref Rng & Units 07/28/2016 04/01/2016 03/04/2016  Glucose 65 - 99 mg/dL 251(H) 229(H) 243(H)  BUN 6 - 20 mg/dL 14 14 17   Creatinine 0.61 - 1.24 mg/dL 0.78 0.80 0.90  Sodium 135 - 145 mmol/L 135 135 136  Potassium 3.5 - 5.1 mmol/L 3.6 3.9 3.5  Chloride 101 - 111 mmol/L 100(L) 96(L) 99(L)  CO2 22 - 32 mmol/L 27 28 28   Calcium 8.9 - 10.3 mg/dL 9.9 10.3 9.4  Total Protein 6.5 - 8.1 g/dL 6.9 7.2 7.0  Total Bilirubin 0.3 - 1.2 mg/dL 1.0 1.0 1.1  Alkaline Phos 38 - 126 U/L 86 77 89  AST 15 - 41 U/L 20 16 16   ALT 17 - 63 U/L 19 19 15(L)    PENDING LABS:  PSA pending   DIAGNOSTIC IMAGING:  Last bone scan: 08/26/15    PATHOLOGY:  Bowel resection path: 02/14/04     Prostate biopsy: 08/10/13    ASSESSMENT & PLAN:   Stage IV metastatic prostate cancer:  -Diagnosed 07/2013 revealing prostate adenocarcinoma with perineural invasion, Gleason 7 (4+3); now with bone mets.  -Continue Zytiga/prednisone.  Reinforced the importance of taking the prednisone with the Zytiga, as directed (he reportedly skips doses of prednisone at times because he is worried about his blood sugars; recommended he maintain follow-up with his PCP as they help continue to manage his elevated blood glucose levels-generally in 200s per patient report). Continue Lupron androgen-deprivation injections every 6 months; next injection due 08/2016.  -Return for follow-up visit in 4 months with labs.   Urinary concerns:  -Likely secondary to prostate cancer and possibly  age-related changes. Recommended continued follow-up with urology, as directed.   Bone mets:  -Last bone scan in 08/2015 with improving abnormal activity in lower lumbar spine and sternum; stable abnormal activity in right side of T12 vertebral body with no new areas of abnormal activity.  -Patient with no new bone pain or complaints today.  -Continue Xgeva injections monthly (his last Xgeva injection was given on 04/01/16); calcium normal today and he will receive Xgeva as scheduled.   Dizziness:  -Symptoms are worse when going from seated to standing position. Likely orthostatic hypotension d/t inadequate fluid intake.  -Recommended he increase non-caffeinated fluid intake per day.   GIST:  -Diagnosed in 01/2004; s/p partial resection. No evidence of recurrence since that time.  -Continue to monitor; can image as clinically indicated, but no routine imaging necessary.   DLBCL:  -Diagnosed in 2005. No lymphadenopathy. No anemia or thrombocytopenia. No leukocytosis. No "B" symptoms including fever, chills, or night sweats.  -Continue to monitor.     Dispo:  -Monthly labs with Xgeva injections.  -Next dose of Lupron due in 08/2016.  -Return to cancer center for follow-up visit in 4 months with labs.    All questions were answered to patient's stated satisfaction. Encouraged patient to call with any new concerns or questions before his next visit to the cancer center and we can certain see him sooner, if needed.    Plan of care discussed with Dr. Talbert Cage, who agrees with the above aforementioned.     Orders placed this encounter:  Orders Placed This Encounter  Procedures  . Comprehensive metabolic panel  . CBC with Differential/Platelet  . Lactate dehydrogenase  . PSA      Mike Craze, NP [place] [phone]

## 2016-07-28 ENCOUNTER — Encounter (HOSPITAL_BASED_OUTPATIENT_CLINIC_OR_DEPARTMENT_OTHER): Payer: Medicare Other

## 2016-07-28 ENCOUNTER — Encounter (HOSPITAL_COMMUNITY): Payer: Self-pay

## 2016-07-28 ENCOUNTER — Encounter (HOSPITAL_COMMUNITY): Payer: Self-pay | Admitting: Adult Health

## 2016-07-28 ENCOUNTER — Encounter (HOSPITAL_COMMUNITY): Payer: Medicare Other

## 2016-07-28 ENCOUNTER — Encounter (HOSPITAL_COMMUNITY): Payer: Medicare Other | Attending: Adult Health | Admitting: Adult Health

## 2016-07-28 VITALS — BP 153/72 | HR 100 | Resp 18 | Ht 70.0 in | Wt 180.9 lb

## 2016-07-28 DIAGNOSIS — C7951 Secondary malignant neoplasm of bone: Secondary | ICD-10-CM

## 2016-07-28 DIAGNOSIS — C61 Malignant neoplasm of prostate: Secondary | ICD-10-CM | POA: Diagnosis not present

## 2016-07-28 DIAGNOSIS — Z8579 Personal history of other malignant neoplasms of lymphoid, hematopoietic and related tissues: Secondary | ICD-10-CM | POA: Diagnosis not present

## 2016-07-28 DIAGNOSIS — R42 Dizziness and giddiness: Secondary | ICD-10-CM | POA: Diagnosis not present

## 2016-07-28 DIAGNOSIS — H35371 Puckering of macula, right eye: Secondary | ICD-10-CM | POA: Diagnosis not present

## 2016-07-28 DIAGNOSIS — Z79899 Other long term (current) drug therapy: Secondary | ICD-10-CM

## 2016-07-28 LAB — COMPREHENSIVE METABOLIC PANEL
ALT: 19 U/L (ref 17–63)
AST: 20 U/L (ref 15–41)
Albumin: 3.8 g/dL (ref 3.5–5.0)
Alkaline Phosphatase: 86 U/L (ref 38–126)
Anion gap: 8 (ref 5–15)
BUN: 14 mg/dL (ref 6–20)
CO2: 27 mmol/L (ref 22–32)
Calcium: 9.9 mg/dL (ref 8.9–10.3)
Chloride: 100 mmol/L — ABNORMAL LOW (ref 101–111)
Creatinine, Ser: 0.78 mg/dL (ref 0.61–1.24)
GFR calc Af Amer: >60 mL/min (ref >60)
GFR calc non Af Amer: >60 mL/min (ref >60)
Glucose, Bld: 251 mg/dL — ABNORMAL HIGH (ref 65–99)
Potassium: 3.6 mmol/L (ref 3.5–5.1)
Sodium: 135 mmol/L (ref 135–145)
Total Bilirubin: 1 mg/dL (ref 0.3–1.2)
Total Protein: 6.9 g/dL (ref 6.5–8.1)

## 2016-07-28 LAB — CBC WITH DIFFERENTIAL/PLATELET
Basophils Absolute: 0 10*3/uL (ref 0.0–0.1)
Basophils Relative: 0 %
Eosinophils Absolute: 0 10*3/uL (ref 0.0–0.7)
Eosinophils Relative: 0 %
HCT: 41.3 % (ref 39.0–52.0)
Hemoglobin: 14.8 g/dL (ref 13.0–17.0)
Lymphocytes Relative: 18 %
Lymphs Abs: 1.4 10*3/uL (ref 0.7–4.0)
MCH: 32.6 pg (ref 26.0–34.0)
MCHC: 35.8 g/dL (ref 30.0–36.0)
MCV: 91 fL (ref 78.0–100.0)
Monocytes Absolute: 0.5 10*3/uL (ref 0.1–1.0)
Monocytes Relative: 7 %
Neutro Abs: 5.6 10*3/uL (ref 1.7–7.7)
Neutrophils Relative %: 75 %
Platelets: 167 10*3/uL (ref 150–400)
RBC: 4.54 MIL/uL (ref 4.22–5.81)
RDW: 13 % (ref 11.5–15.5)
WBC: 7.5 10*3/uL (ref 4.0–10.5)

## 2016-07-28 LAB — PSA: PSA: <0.01 ng/mL (ref 0.00–4.00)

## 2016-07-28 MED ORDER — DENOSUMAB 120 MG/1.7ML ~~LOC~~ SOLN
120.0000 mg | Freq: Once | SUBCUTANEOUS | Status: AC
  Administered 2016-07-28: 120 mg via SUBCUTANEOUS
  Filled 2016-07-28: qty 1.7

## 2016-07-28 NOTE — Patient Instructions (Signed)
[facility] at [facility] Discharge Instructions  RECOMMENDATIONS MADE BY THE CONSULTANT AND ANY TEST RESULTS WILL BE SENT TO YOUR REFERRING PHYSICIAN.  Received Xgeva injection today. Follow-up as scheduled. Call clinic for any questions or concerns  Thank you for choosing [facility] at [facility] to provide your oncology and hematology care.  To afford each patient quality time with our provider, please arrive at least 15 minutes before your scheduled appointment time.    If you have a lab appointment with the Cancer Center please come in thru the  Main Entrance and check in at the main information desk  You need to re-schedule your appointment should you arrive 10 or more minutes late.  We strive to give you quality time with our providers, and arriving late affects you and other patients whose appointments are after yours.  Also, if you no show three or more times for appointments you may be dismissed from the clinic at the providers discretion.     Again, thank you for choosing [facility].  Our hope is that these requests will decrease the amount of time that you wait before being seen by our physicians.       _____________________________________________________________  Should you have questions after your visit to  Cancer Center, please contact our office at [phone] between the hours of 8:30 a.m. and 4:30 p.m.  Voicemails left after 4:30 p.m. will not be returned until the following business day.  For prescription refill requests, have your pharmacy contact our office.       Resources For Cancer Patients and their Caregivers ? American Cancer Society: Can assist with transportation, wigs, general needs, runs Look Good Feel Better.        1-888-227-6333 ? Cancer Care: Provides financial assistance, online support groups, medication/co-pay assistance.  1-800-813-HOPE (4673) ? [facility] Assists [place] cancer patients and their families through emotional , educational and financial support.  336-427-4357 ? [place] DSS Where to apply for food stamps, Medicaid and utility assistance. 336-342-1394 ? RCATS: Transportation to medical appointments. 336-347-2287 ? Social Security Administration: May apply for disability if have a Stage IV cancer. 336-342-7796 1-800-772-1213 ? [place] Aging, Disability and Transit Services: Assists with nutrition, care and transit needs. 336-349-2343  Cancer Center Support Programs: @10RELATIVEDAYS@ > Cancer Support Group  2nd Tuesday of the month 1pm-2pm, Journey Room  > Creative Journey  3rd Tuesday of the month 1130am-1pm, Journey Room  > Look Good Feel Better  1st Wednesday of the month 10am-12 noon, Journey Room (Call American Cancer Society to register 1-800-395-5775)   

## 2016-07-28 NOTE — Progress Notes (Signed)
[name] tolerated Xgeva injection well without complaints or incident. Calcium 9.9 Pt denied any tooth, jaw or leg pain prior to administering Xgeva. Small skin tear noted on pt's right arm from his watch. Area covered with 2x2 and wrapped with gauze.Pt discharged self ambulatory in satisfactory condition accompanied by his wife

## 2016-07-28 NOTE — Patient Instructions (Signed)
[place] at [facility] Discharge Instructions  RECOMMENDATIONS MADE BY THE CONSULTANT AND ANY TEST RESULTS WILL BE SENT TO YOUR REFERRING PHYSICIAN.  You were seen today by Mike Craze NP. Continue monthly injections. Return in 4 months for labs, follow up and injection.  Increase your fluid intake, try drinking G2 Gatorade.  Thank you for choosing [place] at [facility] to provide your oncology and hematology care.  To afford each patient quality time with our provider, please arrive at least 15 minutes before your scheduled appointment time.    If you have a lab appointment with the [facility] please come in thru the  Main Entrance and check in at the main information desk  You need to re-schedule your appointment should you arrive 10 or more minutes late.  We strive to give you quality time with our providers, and arriving late affects you and other patients whose appointments are after yours.  Also, if you no show three or more times for appointments you may be dismissed from the clinic at the providers discretion.     Again, thank you for choosing [facility].  Our hope is that these requests will decrease the amount of time that you wait before being seen by our physicians.       _____________________________________________________________  Should you have questions after your visit to [facility], please contact our office at [phone] between the hours of 8:30 a.m. and 4:30 p.m.  Voicemails left after 4:30 p.m. will not be returned until the following business day.  For prescription refill requests, have your pharmacy contact our office.       Resources For Cancer Patients and their Caregivers ? American Cancer Society: Can assist with transportation, wigs, general needs, runs Look Good Feel Better.        5878642785 ? Cancer Care: Provides financial assistance, online support groups,  medication/co-pay assistance.  1-800-813-HOPE (785) 565-6131) ? [place] Assists [place] cancer patients and their families through emotional , educational and financial support.  918-619-9822 ? [place] DSS Where to apply for food stamps, Medicaid and utility assistance. 229 162 5771 ? RCATS: Transportation to medical appointments. 3471496345 ? Social Security Administration: May apply for disability if have a Stage IV cancer. 251-423-9267 272-453-5243 ? [employer], Disability and Transit Services: Assists with nutrition, care and transit needs. [place] Support Programs: @10RELATIVEDAYS @ > Cancer Support Group  2nd Tuesday of the month 1pm-2pm, Journey Room  > Creative Journey  3rd Tuesday of the month 1130am-1pm, Journey Room  > Look Good Feel Better  1st Wednesday of the month 10am-12 noon, Journey Room (Call [place] to register [phone])

## 2016-07-29 DIAGNOSIS — C8580 Other specified types of non-Hodgkin lymphoma, unspecified site: Secondary | ICD-10-CM | POA: Diagnosis not present

## 2016-07-29 DIAGNOSIS — M069 Rheumatoid arthritis, unspecified: Secondary | ICD-10-CM | POA: Diagnosis not present

## 2016-07-29 DIAGNOSIS — N390 Urinary tract infection, site not specified: Secondary | ICD-10-CM | POA: Diagnosis not present

## 2016-07-29 DIAGNOSIS — I2510 Atherosclerotic heart disease of native coronary artery without angina pectoris: Secondary | ICD-10-CM | POA: Diagnosis not present

## 2016-07-29 DIAGNOSIS — E1165 Type 2 diabetes mellitus with hyperglycemia: Secondary | ICD-10-CM | POA: Diagnosis not present

## 2016-07-29 DIAGNOSIS — M549 Dorsalgia, unspecified: Secondary | ICD-10-CM | POA: Diagnosis not present

## 2016-07-29 DIAGNOSIS — I482 Chronic atrial fibrillation: Secondary | ICD-10-CM | POA: Diagnosis not present

## 2016-07-29 DIAGNOSIS — E119 Type 2 diabetes mellitus without complications: Secondary | ICD-10-CM | POA: Diagnosis not present

## 2016-07-29 DIAGNOSIS — I10 Essential (primary) hypertension: Secondary | ICD-10-CM | POA: Diagnosis not present

## 2016-07-29 DIAGNOSIS — D649 Anemia, unspecified: Secondary | ICD-10-CM | POA: Diagnosis not present

## 2016-07-29 DIAGNOSIS — C61 Malignant neoplasm of prostate: Secondary | ICD-10-CM | POA: Diagnosis not present

## 2016-07-30 ENCOUNTER — Other Ambulatory Visit (HOSPITAL_COMMUNITY): Payer: Self-pay | Admitting: Oncology

## 2016-07-30 DIAGNOSIS — C61 Malignant neoplasm of prostate: Secondary | ICD-10-CM

## 2016-08-03 DIAGNOSIS — Z08 Encounter for follow-up examination after completed treatment for malignant neoplasm: Secondary | ICD-10-CM | POA: Diagnosis not present

## 2016-08-03 DIAGNOSIS — Z85828 Personal history of other malignant neoplasm of skin: Secondary | ICD-10-CM | POA: Diagnosis not present

## 2016-08-03 DIAGNOSIS — X32XXXD Exposure to sunlight, subsequent encounter: Secondary | ICD-10-CM | POA: Diagnosis not present

## 2016-08-03 DIAGNOSIS — L570 Actinic keratosis: Secondary | ICD-10-CM | POA: Diagnosis not present

## 2016-08-03 DIAGNOSIS — D225 Melanocytic nevi of trunk: Secondary | ICD-10-CM | POA: Diagnosis not present

## 2016-08-05 ENCOUNTER — Other Ambulatory Visit: Payer: Self-pay | Admitting: Cardiology

## 2016-08-25 ENCOUNTER — Other Ambulatory Visit: Payer: Self-pay | Admitting: Cardiology

## 2016-08-25 ENCOUNTER — Encounter (HOSPITAL_COMMUNITY): Payer: Medicare Other | Attending: Adult Health

## 2016-08-25 ENCOUNTER — Other Ambulatory Visit (HOSPITAL_COMMUNITY): Payer: Medicare Other

## 2016-08-25 ENCOUNTER — Ambulatory Visit (HOSPITAL_COMMUNITY): Payer: Medicare Other

## 2016-08-25 ENCOUNTER — Encounter (HOSPITAL_BASED_OUTPATIENT_CLINIC_OR_DEPARTMENT_OTHER): Payer: Medicare Other

## 2016-08-25 ENCOUNTER — Ambulatory Visit (INDEPENDENT_AMBULATORY_CARE_PROVIDER_SITE_OTHER): Payer: Medicare Other | Admitting: Urology

## 2016-08-25 VITALS — BP 130/84 | HR 93 | Temp 98.1°F | Resp 18

## 2016-08-25 DIAGNOSIS — C61 Malignant neoplasm of prostate: Secondary | ICD-10-CM

## 2016-08-25 DIAGNOSIS — C7951 Secondary malignant neoplasm of bone: Secondary | ICD-10-CM | POA: Insufficient documentation

## 2016-08-25 DIAGNOSIS — Z8579 Personal history of other malignant neoplasms of lymphoid, hematopoietic and related tissues: Secondary | ICD-10-CM

## 2016-08-25 DIAGNOSIS — B351 Tinea unguium: Secondary | ICD-10-CM | POA: Diagnosis not present

## 2016-08-25 DIAGNOSIS — R9721 Rising PSA following treatment for malignant neoplasm of prostate: Secondary | ICD-10-CM | POA: Diagnosis not present

## 2016-08-25 DIAGNOSIS — E1142 Type 2 diabetes mellitus with diabetic polyneuropathy: Secondary | ICD-10-CM | POA: Diagnosis not present

## 2016-08-25 LAB — CBC WITH DIFFERENTIAL/PLATELET
Basophils Absolute: 0 10*3/uL (ref 0.0–0.1)
Basophils Relative: 0 %
Eosinophils Absolute: 0 10*3/uL (ref 0.0–0.7)
Eosinophils Relative: 0 %
HCT: 39.6 % (ref 39.0–52.0)
Hemoglobin: 14.2 g/dL (ref 13.0–17.0)
Lymphocytes Relative: 20 %
Lymphs Abs: 1.6 10*3/uL (ref 0.7–4.0)
MCH: 32.5 pg (ref 26.0–34.0)
MCHC: 35.9 g/dL (ref 30.0–36.0)
MCV: 90.6 fL (ref 78.0–100.0)
Monocytes Absolute: 0.6 10*3/uL (ref 0.1–1.0)
Monocytes Relative: 8 %
Neutro Abs: 5.9 10*3/uL (ref 1.7–7.7)
Neutrophils Relative %: 72 %
Platelets: 156 10*3/uL (ref 150–400)
RBC: 4.37 MIL/uL (ref 4.22–5.81)
RDW: 13 % (ref 11.5–15.5)
WBC: 8.2 10*3/uL (ref 4.0–10.5)

## 2016-08-25 LAB — COMPREHENSIVE METABOLIC PANEL
ALT: 20 U/L (ref 17–63)
AST: 18 U/L (ref 15–41)
Albumin: 3.7 g/dL (ref 3.5–5.0)
Alkaline Phosphatase: 73 U/L (ref 38–126)
Anion gap: 7 (ref 5–15)
BUN: 16 mg/dL (ref 6–20)
CO2: 29 mmol/L (ref 22–32)
Calcium: 9.4 mg/dL (ref 8.9–10.3)
Chloride: 98 mmol/L — ABNORMAL LOW (ref 101–111)
Creatinine, Ser: 0.8 mg/dL (ref 0.61–1.24)
GFR calc Af Amer: >60 mL/min (ref >60)
GFR calc non Af Amer: >60 mL/min (ref >60)
Glucose, Bld: 223 mg/dL — ABNORMAL HIGH (ref 65–99)
Potassium: 4 mmol/L (ref 3.5–5.1)
Sodium: 134 mmol/L — ABNORMAL LOW (ref 135–145)
Total Bilirubin: 0.9 mg/dL (ref 0.3–1.2)
Total Protein: 6.5 g/dL (ref 6.5–8.1)

## 2016-08-25 LAB — PSA: PSA: <0.01 ng/mL (ref 0.00–4.00)

## 2016-08-25 LAB — LACTATE DEHYDROGENASE: LDH: 157 U/L (ref 98–192)

## 2016-08-25 MED ORDER — DENOSUMAB 120 MG/1.7ML ~~LOC~~ SOLN
120.0000 mg | Freq: Once | SUBCUTANEOUS | Status: AC
  Administered 2016-08-25: 120 mg via SUBCUTANEOUS
  Filled 2016-08-25: qty 1.7

## 2016-08-25 NOTE — Progress Notes (Signed)
[name] presents today for injection per MD orders. Xgeva 120 mg administered SQ in left Abdomen. Administration without incident. Patient tolerated well. Stable and ambulatory on discharge home with wife.

## 2016-09-07 ENCOUNTER — Ambulatory Visit (INDEPENDENT_AMBULATORY_CARE_PROVIDER_SITE_OTHER): Payer: Medicare Other | Admitting: *Deleted

## 2016-09-07 DIAGNOSIS — Z5181 Encounter for therapeutic drug level monitoring: Secondary | ICD-10-CM

## 2016-09-07 DIAGNOSIS — I480 Paroxysmal atrial fibrillation: Secondary | ICD-10-CM | POA: Diagnosis not present

## 2016-09-07 DIAGNOSIS — I4891 Unspecified atrial fibrillation: Secondary | ICD-10-CM

## 2016-09-07 LAB — POCT INR: INR: 1.5

## 2016-09-15 ENCOUNTER — Encounter (HOSPITAL_BASED_OUTPATIENT_CLINIC_OR_DEPARTMENT_OTHER): Payer: Medicare Other

## 2016-09-15 VITALS — BP 153/78 | HR 72 | Temp 98.0°F | Resp 19

## 2016-09-15 DIAGNOSIS — Z5111 Encounter for antineoplastic chemotherapy: Secondary | ICD-10-CM | POA: Diagnosis present

## 2016-09-15 DIAGNOSIS — C7951 Secondary malignant neoplasm of bone: Secondary | ICD-10-CM | POA: Diagnosis not present

## 2016-09-15 DIAGNOSIS — C61 Malignant neoplasm of prostate: Secondary | ICD-10-CM | POA: Diagnosis not present

## 2016-09-15 MED ORDER — LEUPROLIDE ACETATE (6 MONTH) 45 MG IM KIT
45.0000 mg | PACK | Freq: Once | INTRAMUSCULAR | Status: AC
  Administered 2016-09-15: 45 mg via INTRAMUSCULAR
  Filled 2016-09-15: qty 45

## 2016-09-15 NOTE — Progress Notes (Signed)
[name] presents today for injection per MD orders. Lupron 45 mg administered IM in right upper outer Gluteal. Administration without incident. Patient tolerated well. Stable and ambulatory on discharge home to self.

## 2016-09-15 NOTE — Patient Instructions (Signed)
[place] at [facility] Discharge Instructions  RECOMMENDATIONS MADE BY THE CONSULTANT AND ANY TEST RESULTS WILL BE SENT TO YOUR REFERRING PHYSICIAN.  Lupron 45 mg injection given as ordered. Return as scheduled.  Thank you for choosing [place] at [facility] to provide your oncology and hematology care.  To afford each patient quality time with our provider, please arrive at least 15 minutes before your scheduled appointment time.    If you have a lab appointment with the [facility] please come in thru the  Main Entrance and check in at the main information desk  You need to re-schedule your appointment should you arrive 10 or more minutes late.  We strive to give you quality time with our providers, and arriving late affects you and other patients whose appointments are after yours.  Also, if you no show three or more times for appointments you may be dismissed from the clinic at the providers discretion.     Again, thank you for choosing [facility].  Our hope is that these requests will decrease the amount of time that you wait before being seen by our physicians.       _____________________________________________________________  Should you have questions after your visit to [facility], please contact our office at [phone] between the hours of 8:30 a.m. and 4:30 p.m.  Voicemails left after 4:30 p.m. will not be returned until the following business day.  For prescription refill requests, have your pharmacy contact our office.       Resources For Cancer Patients and their Caregivers ? American Cancer Society: Can assist with transportation, wigs, general needs, runs Look Good Feel Better.        781-785-9252 ? Cancer Care: Provides financial assistance, online support groups, medication/co-pay assistance.  1-800-813-HOPE 872-227-6048) ? [facility] Assists [place] cancer  patients and their families through emotional , educational and financial support.  774-312-5691 ? [place] DSS Where to apply for food stamps, Medicaid and utility assistance. 980-281-8070 ? RCATS: Transportation to medical appointments. 615-513-8384 ? Social Security Administration: May apply for disability if have a Stage IV cancer. 670-454-3552 (670) 859-8302 ? [employer], Disability and Transit Services: Assists with nutrition, care and transit needs. [place] Support Programs: @10RELATIVEDAYS @ > Cancer Support Group  2nd Tuesday of the month 1pm-2pm, Journey Room  > Creative Journey  3rd Tuesday of the month 1130am-1pm, Journey Room  > Look Good Feel Better  1st Wednesday of the month 10am-12 noon, Journey Room (Call [place] to register [phone])

## 2016-09-21 ENCOUNTER — Ambulatory Visit (INDEPENDENT_AMBULATORY_CARE_PROVIDER_SITE_OTHER): Payer: Medicare Other | Admitting: *Deleted

## 2016-09-21 DIAGNOSIS — Z5181 Encounter for therapeutic drug level monitoring: Secondary | ICD-10-CM

## 2016-09-21 DIAGNOSIS — I480 Paroxysmal atrial fibrillation: Secondary | ICD-10-CM

## 2016-09-21 LAB — POCT INR: INR: 1.7

## 2016-09-22 ENCOUNTER — Encounter (HOSPITAL_COMMUNITY): Payer: Medicare Other | Attending: Oncology

## 2016-09-22 ENCOUNTER — Encounter (HOSPITAL_COMMUNITY): Payer: Medicare Other

## 2016-09-22 ENCOUNTER — Encounter (HOSPITAL_COMMUNITY): Payer: Self-pay

## 2016-09-22 VITALS — BP 140/79 | HR 81 | Temp 97.6°F | Resp 18

## 2016-09-22 DIAGNOSIS — C7951 Secondary malignant neoplasm of bone: Secondary | ICD-10-CM | POA: Insufficient documentation

## 2016-09-22 DIAGNOSIS — Z8572 Personal history of non-Hodgkin lymphomas: Secondary | ICD-10-CM

## 2016-09-22 DIAGNOSIS — C61 Malignant neoplasm of prostate: Secondary | ICD-10-CM

## 2016-09-22 DIAGNOSIS — Z8579 Personal history of other malignant neoplasms of lymphoid, hematopoietic and related tissues: Secondary | ICD-10-CM

## 2016-09-22 LAB — COMPREHENSIVE METABOLIC PANEL
ALT: 20 U/L (ref 17–63)
AST: 21 U/L (ref 15–41)
Albumin: 3.7 g/dL (ref 3.5–5.0)
Alkaline Phosphatase: 67 U/L (ref 38–126)
Anion gap: 9 (ref 5–15)
BUN: 13 mg/dL (ref 6–20)
CO2: 29 mmol/L (ref 22–32)
Calcium: 9.4 mg/dL (ref 8.9–10.3)
Chloride: 97 mmol/L — ABNORMAL LOW (ref 101–111)
Creatinine, Ser: 0.81 mg/dL (ref 0.61–1.24)
GFR calc Af Amer: >60 mL/min (ref >60)
GFR calc non Af Amer: >60 mL/min (ref >60)
Glucose, Bld: 245 mg/dL — ABNORMAL HIGH (ref 65–99)
Potassium: 3.7 mmol/L (ref 3.5–5.1)
Sodium: 135 mmol/L (ref 135–145)
Total Bilirubin: 1.2 mg/dL (ref 0.3–1.2)
Total Protein: 6.4 g/dL — ABNORMAL LOW (ref 6.5–8.1)

## 2016-09-22 LAB — CBC WITH DIFFERENTIAL/PLATELET
Basophils Absolute: 0 10*3/uL (ref 0.0–0.1)
Basophils Relative: 0 %
Eosinophils Absolute: 0 10*3/uL (ref 0.0–0.7)
Eosinophils Relative: 0 %
HCT: 40.2 % (ref 39.0–52.0)
Hemoglobin: 14.2 g/dL (ref 13.0–17.0)
Lymphocytes Relative: 32 %
Lymphs Abs: 2.3 10*3/uL (ref 0.7–4.0)
MCH: 32.5 pg (ref 26.0–34.0)
MCHC: 35.3 g/dL (ref 30.0–36.0)
MCV: 92 fL (ref 78.0–100.0)
Monocytes Absolute: 0.8 10*3/uL (ref 0.1–1.0)
Monocytes Relative: 11 %
Neutro Abs: 4 10*3/uL (ref 1.7–7.7)
Neutrophils Relative %: 57 %
Platelets: 161 10*3/uL (ref 150–400)
RBC: 4.37 MIL/uL (ref 4.22–5.81)
RDW: 13.4 % (ref 11.5–15.5)
WBC: 7.1 10*3/uL (ref 4.0–10.5)

## 2016-09-22 LAB — LACTATE DEHYDROGENASE: LDH: 144 U/L (ref 98–192)

## 2016-09-22 LAB — PSA: PSA: 0.01 ng/mL (ref 0.00–4.00)

## 2016-09-22 MED ORDER — DENOSUMAB 120 MG/1.7ML ~~LOC~~ SOLN
120.0000 mg | Freq: Once | SUBCUTANEOUS | Status: AC
  Administered 2016-09-22: 120 mg via SUBCUTANEOUS
  Filled 2016-09-22: qty 1.7

## 2016-09-22 NOTE — Progress Notes (Signed)
[name] tolerated Xgeva injection well without complaints or incident. Labs, including Calcium of 9.4,  reviewed prior to administering this injection. Pt denied any tooth,jaw or leg pain prior to injection as well. VSS Pt discharged self ambulatory in satisfactory condition accompanied by [name]

## 2016-09-22 NOTE — Patient Instructions (Signed)
[facility] at [facility] Discharge Instructions  RECOMMENDATIONS MADE BY THE CONSULTANT AND ANY TEST RESULTS WILL BE SENT TO YOUR REFERRING PHYSICIAN.  Received Xgeva injection today. Follow-up as scheduled. Call clinic for any questions or concerns  Thank you for choosing [facility] at [facility] to provide your oncology and hematology care.  To afford each patient quality time with our provider, please arrive at least 15 minutes before your scheduled appointment time.    If you have a lab appointment with the Cancer Center please come in thru the  Main Entrance and check in at the main information desk  You need to re-schedule your appointment should you arrive 10 or more minutes late.  We strive to give you quality time with our providers, and arriving late affects you and other patients whose appointments are after yours.  Also, if you no show three or more times for appointments you may be dismissed from the clinic at the providers discretion.     Again, thank you for choosing [facility].  Our hope is that these requests will decrease the amount of time that you wait before being seen by our physicians.       _____________________________________________________________  Should you have questions after your visit to  Cancer Center, please contact our office at [phone] between the hours of 8:30 a.m. and 4:30 p.m.  Voicemails left after 4:30 p.m. will not be returned until the following business day.  For prescription refill requests, have your pharmacy contact our office.       Resources For Cancer Patients and their Caregivers ? American Cancer Society: Can assist with transportation, wigs, general needs, runs Look Good Feel Better.        1-888-227-6333 ? Cancer Care: Provides financial assistance, online support groups, medication/co-pay assistance.  1-800-813-HOPE (4673) ? [facility] Assists [place] cancer patients and their families through emotional , educational and financial support.  336-427-4357 ? [place] DSS Where to apply for food stamps, Medicaid and utility assistance. 336-342-1394 ? [facility]: Transportation to medical appointments. 336-347-2287 ? Social Security Administration: May apply for disability if have a Stage IV cancer. 336-342-7796 1-800-772-1213 ? [place] Aging, Disability and Transit Services: Assists with nutrition, care and transit needs. 336-349-2343  Cancer Center Support Programs: [email or web address]@ > Cancer Support Group  2nd Tuesday of the month 1pm-2pm, Journey Room  > Creative Journey  3rd Tuesday of the month 1130am-1pm, Journey Room  > Look Good Feel Better  1st Wednesday of the month 10am-12 noon, Journey Room (Call American Cancer Society to register 1-800-395-5775)   

## 2016-09-24 ENCOUNTER — Encounter (HOSPITAL_COMMUNITY): Payer: Self-pay

## 2016-09-24 ENCOUNTER — Emergency Department (HOSPITAL_COMMUNITY)
Admission: EM | Admit: 2016-09-24 | Discharge: 2016-09-24 | Disposition: A | Discharge status: Home / Self Care | Payer: Medicare Other | Attending: Emergency Medicine | Admitting: Emergency Medicine

## 2016-09-24 DIAGNOSIS — E119 Type 2 diabetes mellitus without complications: Secondary | ICD-10-CM | POA: Insufficient documentation

## 2016-09-24 DIAGNOSIS — Y999 Unspecified external cause status: Secondary | ICD-10-CM | POA: Insufficient documentation

## 2016-09-24 DIAGNOSIS — Y929 Unspecified place or not applicable: Secondary | ICD-10-CM | POA: Diagnosis not present

## 2016-09-24 DIAGNOSIS — Z79899 Other long term (current) drug therapy: Secondary | ICD-10-CM | POA: Insufficient documentation

## 2016-09-24 DIAGNOSIS — Z7984 Long term (current) use of oral hypoglycemic drugs: Secondary | ICD-10-CM | POA: Insufficient documentation

## 2016-09-24 DIAGNOSIS — W268XXA Contact with other sharp object(s), not elsewhere classified, initial encounter: Secondary | ICD-10-CM | POA: Diagnosis not present

## 2016-09-24 DIAGNOSIS — S61211A Laceration without foreign body of left index finger without damage to nail, initial encounter: Secondary | ICD-10-CM | POA: Insufficient documentation

## 2016-09-24 DIAGNOSIS — I10 Essential (primary) hypertension: Secondary | ICD-10-CM | POA: Diagnosis not present

## 2016-09-24 DIAGNOSIS — Z23 Encounter for immunization: Secondary | ICD-10-CM | POA: Insufficient documentation

## 2016-09-24 DIAGNOSIS — Y939 Activity, unspecified: Secondary | ICD-10-CM | POA: Diagnosis not present

## 2016-09-24 DIAGNOSIS — S60941A Unspecified superficial injury of left index finger, initial encounter: Secondary | ICD-10-CM | POA: Diagnosis present

## 2016-09-24 MED ORDER — LIDOCAINE HCL (PF) 1 % IJ SOLN
INTRAMUSCULAR | Status: AC
  Filled 2016-09-24: qty 5

## 2016-09-24 MED ORDER — TETANUS-DIPHTH-ACELL PERTUSSIS 5-2.5-18.5 LF-MCG/0.5 IM SUSP
0.5000 mL | Freq: Once | INTRAMUSCULAR | Status: AC
  Administered 2016-09-24: 0.5 mL via INTRAMUSCULAR
  Filled 2016-09-24: qty 0.5

## 2016-09-24 MED ORDER — LIDOCAINE HCL (PF) 1 % IJ SOLN
30.0000 mL | Freq: Once | INTRAMUSCULAR | Status: AC
  Administered 2016-09-24: 30 mL
  Filled 2016-09-24: qty 30

## 2016-09-24 NOTE — ED Provider Notes (Signed)
[facility] DEPT Provider Note   CSN: [id number] Arrival date & time: 09/24/16  1905     History   Chief Complaint Chief Complaint  Patient presents with  . Laceration    HPI [name] is a 83 y.o. male.  The history is provided by the patient.  Laceration   The incident occurred 1 to 2 hours ago. Pain location: left index finger. The laceration is 3 cm in size. Injury mechanism: blade. The pain is mild. The pain has been constant since onset. He reports no foreign bodies present. His tetanus status is unknown.    Past Medical History:  Diagnosis Date  . Atrial fibrillation ([place])    Dr. Harl Bowie- [facility] follows saw 11'14  . Bladder stones    tx. with oral meds and antibiotics, now surgery planned  . Cataracts, both eyes    surgery planned May 2015  . Diabetes mellitus without complication ([place])   . 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 Margot Chimes, thus far without recurrence.   . Hypertension   . NHL (non-Hodgkin's lymphoma) ([place]) 08/18/2011   Diffuse large B-cell lymphoma, clinically stage IIIA, CD20 positive, status post cervical lymph node biopsy 08/03/2003 on the left. PET scan was also positive in the spleen and small bowel region, but bone marrow aspiration and biopsy were negative. So he essentially had stage IIIAs. He received R-CHOP x6 cycles with CR established by PET scan criteria on 11/23/2003 with no evidence for relapse th    Patient Active Problem List   Diagnosis Date Noted  . Other dysphagia 01/21/2016  . Bone metastases ([place]) 08/21/2015  . Prostate cancer ([place]) 09/08/2013  . Hypertrophy of prostate with urinary obstruction and other lower urinary tract symptoms (LUTS) 08/10/2013  . Encounter for therapeutic  drug monitoring 06/12/2013  . NHL (non-Hodgkin's lymphoma) ([place]) 08/18/2011  . 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 Long term (current) use of anticoagulants 07/18/2010  . HYPERTENSION, UNSPECIFIED 10/04/2008  . ATRIAL FIBRILLATION 10/04/2008    Past Surgical History:  Procedure Laterality Date  . CATARACT EXTRACTION Right 08/2013  . CHOLECYSTECTOMY    . CYSTOSCOPY WITH LITHOLAPAXY N/A 08/10/2013   Procedure: CYSTOSCOPY WITH LITHOLAPAXY WITH [name];  Surgeon: [name], MD;  [facility]: [facility];  Service: Urology;  Laterality: N/A;  . ESOPHAGEAL DILATION N/A 02/27/2016   Procedure: ESOPHAGEAL DILATION;  Surgeon: [name], MD;  [facility];  Service: Endoscopy;  Laterality: N/A;  . ESOPHAGOGASTRODUODENOSCOPY N/A 02/27/2016   Procedure: ESOPHAGOGASTRODUODENOSCOPY (EGD);  Surgeon: [name], MD;  [facility];  Service: Endoscopy;  Laterality: N/A;  12:00  . INCISION AND DRAINAGE PERIRECTAL ABSCESS    . LYMPH NODE DISSECTION Left    '05  . PORT-A-CATH REMOVAL    . PORTACATH PLACEMENT     insertion and removal -last chemotherapy 10 yrs ago  . TRANSURETHRAL RESECTION OF PROSTATE N/A 08/10/2013   Procedure: TRANSURETHRAL RESECTION OF THE PROSTATE WITH GYRUS INSTRUMENTS;  Surgeon: [name], MD;  Location: [facility];  Service: Urology;  Laterality: N/A;       Home Medications    Prior to Admission medications   Medication Sig Start Date End Date Taking? Authorizing Provider  abiraterone Acetate (ZYTIGA) 250 MG tablet Take 4 tablets (1,000  mg total) by mouth daily. Take on an empty stomach 1 hour before or 2 hours after a meal 07/24/15   Kefalas, Manon Hilding, PA-C  acetaminophen (TYLENOL) 500 MG tablet Take 500-1,000 mg by mouth daily as needed for mild pain, fever or headache.    [provider]  amitriptyline (ELAVIL) 25 MG tablet Take 25 mg by mouth at bedtime.    [provider]  Calcium  Carb-Cholecalciferol 600-800 MG-UNIT TABS Take 1 capsule by mouth daily.     [provider]  Cholecalciferol (VITAMIN D3) 1000 units CAPS Take 1,000 Units by mouth daily.    [provider]  clotrimazole-betamethasone (LOTRISONE) cream Apply 1 application topically daily as needed (fungus).  06/18/15   [provider]  CRANBERRY PO Take 4,200 mg by mouth daily.    [provider]  denosumab (XGEVA) 120 MG/1.7ML SOLN injection Inject 120 mg into the skin every 30 (thirty) days.     [provider]  diltiazem (CARDIZEM) 90 MG tablet Take 1 tablet (90 mg total) by mouth 2 (two) times daily. 10/23/15   Arnoldo Lenis, MD  docusate sodium (COLACE) 100 MG capsule Take 100 mg by mouth daily as needed for mild constipation.    [provider]  glipiZIDE (GLUCOTROL XL) 5 MG 24 hr tablet Take 5 mg by mouth daily with breakfast.  06/25/15   [provider]  KLOR-CON M10 10 MEQ tablet TAKE ONE TABLET BY MOUTH ONCE DAILY --  **NEEDS  APPOINTMENT  FOR  FURTHER  REFILLS** 08/05/16   Arnoldo Lenis, MD  Leuprolide Acetate, 6 Month, (LUPRON DEPOT) 45 MG injection Inject 45 mg into the muscle every 6 (six) months.    [provider]  lisinopril-hydrochlorothiazide (PRINZIDE,ZESTORETIC) 20-12.5 MG tablet TAKE ONE TABLET BY MOUTH ONCE DAILY 08/05/16   Arnoldo Lenis, MD  loperamide (IMODIUM) 2 MG capsule Take 2 mg by mouth as needed for diarrhea or loose stools.    [provider]  metFORMIN (GLUCOPHAGE) 500 MG tablet Take 850 mg by mouth 2 (two) times daily with a meal.  03/15/15   [provider]  nystatin (MYCOSTATIN) powder Apply topically 4 (four) times daily as needed (fungus).     [provider]  omeprazole (PRILOSEC) 20 MG capsule Take 20 mg by mouth daily.     [provider]  potassium chloride (KLOR-CON M10) 10 MEQ tablet Take 1 tablet (10 mEq total) by mouth daily. 05/13/16   Penland, Kelby Fam,  MD  predniSONE (DELTASONE) 5 MG tablet TAKE ONE TABLET BY MOUTH TWICE DAILY WITH A MEAL 07/30/16   Baird Cancer, PA-C  sennosides-docusate sodium (SENOKOT-S) 8.6-50 MG tablet Take 1 tablet by mouth daily.    [provider]  warfarin (COUMADIN) 2.5 MG tablet Take as directed by Coumadin Clinic. 02/28/16   [name], MD  warfarin (COUMADIN) 2.5 MG tablet Take 2 tablets daily except 1 tablet on Mondays, Wednesdays and Fridays 07/21/16   Arnoldo Lenis, MD    Family History History reviewed. No pertinent family history.  Social History Social History  Substance Use Topics  . Smoking status: Never Smoker  . Smokeless tobacco: Never Used  . Alcohol use No     Allergies   Patient has no known allergies.   Review of Systems Review of Systems  Constitutional: Negative for fever.  Respiratory: Negative for shortness of breath.   Cardiovascular: Negative for chest pain.  Skin: Positive for wound.  Allergic/Immunologic:  Negative for immunocompromised state.  Neurological: Negative for light-headedness.  Hematological: Bruises/bleeds easily.     Physical Exam Updated Vital Signs BP (!) 144/80 (BP Location: Right Arm)   Pulse 98   Temp 98.6 F (37 C) (Oral)   Resp 16   Ht 5\' 10"  (1.778 m)   Wt 81.6 kg (180 lb)   SpO2 97%   BMI 25.83 kg/m   Physical Exam Physical Exam  Constitutional: He appears well-developed and well-nourished.  HENT:  Head: Normocephalic.  Eyes: Conjunctivae are normal.  Cardiovascular: Normal rate and intact distal pulses.   Pulmonary/Chest: Effort normal. No respiratory distress.  Abdominal: He exhibits no distension.  Musculoskeletal: Normal range of motion. He exhibits no deformity.  Neurological: He is alert. Intact innervation of radia Skin: Skin is warm and dry. 3 cm laceration to the medial aspect of the left index finger with active bleeding Psychiatric: He has a normal mood and affect. His behavior is normal.  Nursing  note and vitals reviewed.   ED Treatments / Results  Labs (all labs ordered are listed, but only abnormal results are displayed) Labs Reviewed - No data to display  EKG  EKG Interpretation None       Radiology No results found.  Procedures .Marland KitchenLaceration Repair Date/Time: 09/24/2016 9:03 PM Performed by: Brantley Stage DUO Authorized by: Brantley Stage DUO   Consent:    Consent obtained:  Verbal   Consent given by:  Patient   Risks discussed:  Infection, pain, poor cosmetic result, poor wound healing and need for additional repair   Alternatives discussed:  No treatment Anesthesia (see MAR for exact dosages):    Anesthesia method:  Local infiltration   Local anesthetic:  Lidocaine 1% w/o epi Laceration details:    Location: left index finger.   Length (cm):  3 Repair type:    Repair type:  Simple Pre-procedure details:    Preparation:  Patient was prepped and draped in usual sterile fashion Exploration:    Hemostasis achieved with:  Direct pressure   Wound exploration: wound explored through full range of motion     Contaminated: no   Treatment:    Area cleansed with:  Betadine   Amount of cleaning:  Standard   Irrigation solution:  Tap water   Irrigation volume:  500 mL   Irrigation method:  Syringe Skin repair:    Repair method:  Sutures   Suture size:  6-0   Suture material:  Nylon   Suture technique:  Simple interrupted   Number of sutures:  4 Approximation:    Approximation:  Close Post-procedure details:    Dressing:  Bulky dressing   Patient tolerance of procedure:  Tolerated well, no immediate complications   (including critical care time)  Medications Ordered in ED Medications  Tdap (BOOSTRIX) injection 0.5 mL (not administered)  lidocaine (PF) (XYLOCAINE) 1 % injection 30 mL (30 mLs Infiltration Given 09/24/16 2004)     Initial Impression / Assessment and Plan / ED Course  I have reviewed the triage vital signs and the nursing notes.  Pertinent labs  & imaging results that were available during my care of the patient were reviewed by me and considered in my medical decision making (see chart for details).     Finger laceration on warfarin. Bleeding controlled with deep pressure. Laceration repaired. Strict return and follow-up instructions reviewed. He expressed understanding of all discharge instructions and felt comfortable with the plan of care.    Final Clinical Impressions(s) / ED  Diagnoses   Final diagnoses:  Laceration of left index finger without foreign body without damage to nail, initial encounter    New Prescriptions New Prescriptions   No medications on file     Forde Dandy, MD 09/24/16 2104

## 2016-09-24 NOTE — ED Triage Notes (Addendum)
Pt reports left index finger linear laceration on a lawn mower blade this evening. Concerned that he is on Coumadin and could not control bleeding. Area cleansed with saline and pressure dressing applied, bleeding controlled. Unable to recall last Tetanus Vaccine, but states in October the TDAP was contraindicated but he cannot recall why.

## 2016-09-24 NOTE — Discharge Instructions (Signed)
Keep wound covered and dry for 24 hours. Then you can wash hands, but keep covered and dry after washing. Follow up w/ Dr. Legrand Rams in 5-7 days for suture removal Return for signs of infection, such as fever, increased redness/swelling, pus drainage or any other symptoms concerning to you.

## 2016-09-30 ENCOUNTER — Other Ambulatory Visit: Payer: Self-pay

## 2016-09-30 DIAGNOSIS — E1165 Type 2 diabetes mellitus with hyperglycemia: Secondary | ICD-10-CM | POA: Diagnosis not present

## 2016-09-30 DIAGNOSIS — S61210A Laceration without foreign body of right index finger without damage to nail, initial encounter: Secondary | ICD-10-CM | POA: Diagnosis not present

## 2016-09-30 MED ORDER — LISINOPRIL-HYDROCHLOROTHIAZIDE 20-12.5 MG PO TABS: 1.0000 | ORAL_TABLET | Freq: Every day | ORAL | 3 refills | Status: DC

## 2016-10-05 ENCOUNTER — Other Ambulatory Visit: Payer: Self-pay

## 2016-10-05 MED ORDER — LISINOPRIL-HYDROCHLOROTHIAZIDE 20-12.5 MG PO TABS: 1.0000 | ORAL_TABLET | Freq: Every day | ORAL | 3 refills | Status: DC

## 2016-10-05 NOTE — Telephone Encounter (Signed)
Fax request refilled [facility] for lisinopril/hctz

## 2016-10-12 ENCOUNTER — Ambulatory Visit (INDEPENDENT_AMBULATORY_CARE_PROVIDER_SITE_OTHER): Payer: Medicare Other | Admitting: *Deleted

## 2016-10-12 DIAGNOSIS — I480 Paroxysmal atrial fibrillation: Secondary | ICD-10-CM

## 2016-10-12 DIAGNOSIS — Z5181 Encounter for therapeutic drug level monitoring: Secondary | ICD-10-CM | POA: Diagnosis not present

## 2016-10-12 LAB — POCT INR: INR: 1.9

## 2016-10-15 ENCOUNTER — Ambulatory Visit (INDEPENDENT_AMBULATORY_CARE_PROVIDER_SITE_OTHER): Payer: Medicare Other | Admitting: Otolaryngology

## 2016-10-15 DIAGNOSIS — H6123 Impacted cerumen, bilateral: Secondary | ICD-10-CM | POA: Diagnosis not present

## 2016-10-19 ENCOUNTER — Encounter: Payer: Self-pay | Admitting: *Deleted

## 2016-10-19 ENCOUNTER — Ambulatory Visit (INDEPENDENT_AMBULATORY_CARE_PROVIDER_SITE_OTHER): Payer: Medicare Other | Admitting: Cardiology

## 2016-10-19 ENCOUNTER — Encounter: Payer: Self-pay | Admitting: Cardiology

## 2016-10-19 VITALS — BP 176/84 | HR 91 | Ht 70.0 in | Wt 178.0 lb

## 2016-10-19 DIAGNOSIS — I482 Chronic atrial fibrillation, unspecified: Secondary | ICD-10-CM

## 2016-10-19 DIAGNOSIS — I10 Essential (primary) hypertension: Secondary | ICD-10-CM

## 2016-10-19 DIAGNOSIS — R600 Localized edema: Secondary | ICD-10-CM

## 2016-10-19 DIAGNOSIS — R011 Cardiac murmur, unspecified: Secondary | ICD-10-CM

## 2016-10-19 NOTE — Progress Notes (Signed)
Clinical Summary Mr. Garabedian is a 83 y.o.male seen today for follow up of the following medical problems.   1. Atrial fibrillation  -no recent palpitations. Denies any bleeding issues on coumadin.   2. HTN  - checking bp's at home, typically 140s/70s-80s - compliant with meds   3. Prostate cancer with bone mets - followed by onc  4. Dizziness - mild orthostatic symptoms with standing - difficulty walking in straight line, he associates it with chronic leg weakness.  - not interested in PT evaluation  SH: his wife Dahl Higinbotham is also a patient of mine.    Past Medical History:  Diagnosis Date  . Atrial fibrillation ([place])    Dr. Harl Bowie- [facility] follows saw 11'14  . Bladder stones    tx. with oral meds and antibiotics, now surgery planned  . Cataracts, both eyes    surgery planned May 2015  . Diabetes mellitus without complication ([place])   . 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 Margot Chimes, thus far without recurrence.   . Hypertension   . NHL (non-Hodgkin's lymphoma) ([place]) 08/18/2011   Diffuse large B-cell lymphoma, clinically stage IIIA, CD20 positive, status post cervical lymph node biopsy 08/03/2003 on the left. PET scan was also positive in the spleen and small bowel region, but bone marrow aspiration and biopsy were negative. So he essentially had stage IIIAs. He received R-CHOP x6 cycles with CR established by PET scan criteria on 11/23/2003 with no evidence for relapse th     No Known Allergies   Current Outpatient Prescriptions  Medication Sig Dispense Refill  . abiraterone Acetate (ZYTIGA) 250 MG tablet Take 4 tablets (1,000 mg total) by mouth daily. Take on an empty stomach 1 hour before or 2 hours after a meal  120 tablet 6  . acetaminophen (TYLENOL) 500 MG tablet Take 500-1,000 mg by mouth daily as needed for mild pain, fever or headache.    Marland Kitchen amitriptyline (ELAVIL) 25 MG tablet Take 25 mg by mouth at bedtime.    . Calcium Carb-Cholecalciferol 600-800 MG-UNIT TABS Take 1 capsule by mouth daily.     . Cholecalciferol (VITAMIN D3) 1000 units CAPS Take 1,000 Units by mouth daily.    . clotrimazole-betamethasone (LOTRISONE) cream Apply 1 application topically daily as needed (fungus).     . CRANBERRY PO Take 4,200 mg by mouth daily.    Marland Kitchen denosumab (XGEVA) 120 MG/1.7ML SOLN injection Inject 120 mg into the skin every 30 (thirty) days.     Marland Kitchen diltiazem (CARDIZEM) 90 MG tablet Take 1 tablet (90 mg total) by mouth 2 (two) times daily. 180 tablet 3  . docusate sodium (COLACE) 100 MG capsule Take 100 mg by mouth daily as needed for mild constipation.    Marland Kitchen glipiZIDE (GLUCOTROL XL) 5 MG 24 hr tablet Take 5 mg by mouth daily with breakfast.     . KLOR-CON M10 10 MEQ tablet TAKE ONE TABLET BY MOUTH ONCE DAILY --  **NEEDS  APPOINTMENT  FOR  FURTHER  REFILLS** 15 tablet 0  . Leuprolide Acetate, 6 Month, (LUPRON DEPOT) 45 MG injection Inject 45 mg into the muscle every 6 (six) months.    Marland Kitchen lisinopril-hydrochlorothiazide (PRINZIDE,ZESTORETIC) 20-12.5 MG tablet Take 1 tablet by mouth daily. 90 tablet 3  . loperamide (  IMODIUM) 2 MG capsule Take 2 mg by mouth as needed for diarrhea or loose stools.    . metFORMIN (GLUCOPHAGE) 500 MG tablet Take 850 mg by mouth 2 (two) times daily with a meal.     . nystatin (MYCOSTATIN) powder Apply topically 4 (four) times daily as needed (fungus).     Marland Kitchen omeprazole (PRILOSEC) 20 MG capsule Take 20 mg by mouth daily.     . potassium chloride (KLOR-CON M10) 10 MEQ tablet Take 1 tablet (10 mEq total) by mouth daily. 90 tablet 1  . predniSONE (DELTASONE) 5 MG tablet TAKE ONE TABLET BY MOUTH TWICE DAILY WITH A MEAL 180 tablet 1  . sennosides-docusate sodium (SENOKOT-S) 8.6-50 MG tablet Take 1  tablet by mouth daily.    Marland Kitchen warfarin (COUMADIN) 2.5 MG tablet Take as directed by Coumadin Clinic. 160 tablet 0   No current facility-administered medications for this visit.      Past Surgical History:  Procedure Laterality Date  . CATARACT EXTRACTION Right 08/2013  . CHOLECYSTECTOMY    . CYSTOSCOPY WITH LITHOLAPAXY N/A 08/10/2013   Procedure: CYSTOSCOPY WITH LITHOLAPAXY WITH [name];  Surgeon: [name], MD;  [facility];  Service: Urology;  Laterality: N/A;  . ESOPHAGEAL DILATION N/A 02/27/2016   Procedure: ESOPHAGEAL DILATION;  Surgeon: [name], MD;  [facility];  Service: Endoscopy;  Laterality: N/A;  . ESOPHAGOGASTRODUODENOSCOPY N/A 02/27/2016   Procedure: ESOPHAGOGASTRODUODENOSCOPY (EGD);  Surgeon: [name], MD;  [facility];  Service: Endoscopy;  Laterality: N/A;  12:00  . INCISION AND DRAINAGE PERIRECTAL ABSCESS    . LYMPH NODE DISSECTION Left    '05  . PORT-A-CATH REMOVAL    . PORTACATH PLACEMENT     insertion and removal -last chemotherapy 10 yrs ago  . TRANSURETHRAL RESECTION OF PROSTATE N/A 08/10/2013   Procedure: TRANSURETHRAL RESECTION OF THE PROSTATE WITH GYRUS INSTRUMENTS;  Surgeon: [name], MD;  [facility];  Service: Urology;  Laterality: N/A;     No Known Allergies    No family history on file.   Social History Mr. Wissing reports that he has never smoked. He has never used smokeless tobacco. Mr. Chalfin reports that he does not drink alcohol.   Review of Systems CONSTITUTIONAL: No weight loss, fever, chills, weakness or fatigue.  HEENT: Eyes: No visual loss, blurred vision, double vision or yellow sclerae.No hearing loss, sneezing, congestion, runny nose or sore throat.  SKIN: No rash or itching.  CARDIOVASCULAR: per hpi RESPIRATORY: No shortness of breath, cough or sputum.  GASTROINTESTINAL: No anorexia, nausea, vomiting or diarrhea. No abdominal pain or blood.  GENITOURINARY: No  burning on urination, no polyuria NEUROLOGICAL: No headache, dizziness, syncope, paralysis, ataxia, numbness or tingling in the extremities. No change in bowel or bladder control.  MUSCULOSKELETAL: No muscle, back pain, joint pain or stiffness.  LYMPHATICS: No enlarged nodes. No history of splenectomy.  PSYCHIATRIC: No history of depression or anxiety.  ENDOCRINOLOGIC: No reports of sweating, cold or heat intolerance. No polyuria or polydipsia.  Marland Kitchen   Physical Examination Vitals:   10/19/16 0903  BP: (!) 176/84  Pulse: 91   Vitals:   10/19/16 0903  Weight: 178 lb (80.7 kg)  Height: 5\' 10"  (1.778 m)    Gen: resting comfortably, no acute distress HEENT: no scleral icterus, pupils equal round and reactive, no palptable cervical adenopathy,  CV: irreg, 2/6 systolic murmur RUSB, no jvd Resp: Clear to auscultation bilaterally GI: abdomen is soft, non-tender, non-distended, normal  bowel sounds, no hepatosplenomegaly MSK: extremities are warm, 1+ bilateral LE edema Skin: warm, no rash Neuro:  no focal deficits Psych: appropriate affect   Diagnostic Studies     Assessment and Plan  1. Afib  - no current symptoms. EKG today shows rate controlled afib.  - he will continue coumadin. CHADS2Vasc score is 4  2. HTN  - bp at goal. Ongong orthostatic symptoms, we had previously lowered his prinzide. ecnouraged to increase oral hydration.   3. Unsteady gait -  we discussed PT evaluation but he is not in favor at this time. I think orthostatic dizziness plays some role, but he also complains of chronic leg weakness  4. Heart murmur - murmur on exam with 1+ bilateral leg edema - order echo    Request pcp labs. F/u 6 months      Arnoldo Lenis, M.D.

## 2016-10-19 NOTE — Patient Instructions (Signed)
Your physician wants you to follow-up in: 6 Months with Dr. Harl Bowie. You will receive a reminder letter in the mail two months in advance. If you don't receive a letter, please call our office to schedule the follow-up appointment.  Your physician recommends that you continue on your current medications as directed. Please refer to the Current Medication list given to you today.  Your physician has requested that you have an echocardiogram. Echocardiography is a painless test that uses sound waves to create images of your heart. It provides your doctor with information about the size and shape of your heart and how well your heart's chambers and valves are working. This procedure takes approximately one hour. There are no restrictions for this procedure.   If you need a refill on your cardiac medications before your next appointment, please call your pharmacy.  Thank you for choosing [facility]!

## 2016-10-20 ENCOUNTER — Encounter (HOSPITAL_COMMUNITY): Payer: Medicare Other | Attending: Oncology

## 2016-10-20 ENCOUNTER — Encounter (HOSPITAL_COMMUNITY): Payer: Self-pay

## 2016-10-20 ENCOUNTER — Encounter (HOSPITAL_COMMUNITY): Payer: Medicare Other

## 2016-10-20 VITALS — BP 149/85 | HR 94 | Temp 97.5°F | Resp 18

## 2016-10-20 DIAGNOSIS — I4891 Unspecified atrial fibrillation: Secondary | ICD-10-CM | POA: Insufficient documentation

## 2016-10-20 DIAGNOSIS — Z7984 Long term (current) use of oral hypoglycemic drugs: Secondary | ICD-10-CM | POA: Diagnosis not present

## 2016-10-20 DIAGNOSIS — C49A0 Gastrointestinal stromal tumor, unspecified site: Secondary | ICD-10-CM | POA: Diagnosis not present

## 2016-10-20 DIAGNOSIS — Z7901 Long term (current) use of anticoagulants: Secondary | ICD-10-CM | POA: Insufficient documentation

## 2016-10-20 DIAGNOSIS — Z79899 Other long term (current) drug therapy: Secondary | ICD-10-CM | POA: Diagnosis not present

## 2016-10-20 DIAGNOSIS — E119 Type 2 diabetes mellitus without complications: Secondary | ICD-10-CM | POA: Diagnosis not present

## 2016-10-20 DIAGNOSIS — C7951 Secondary malignant neoplasm of bone: Secondary | ICD-10-CM

## 2016-10-20 DIAGNOSIS — I10 Essential (primary) hypertension: Secondary | ICD-10-CM | POA: Diagnosis not present

## 2016-10-20 DIAGNOSIS — C61 Malignant neoplasm of prostate: Secondary | ICD-10-CM | POA: Insufficient documentation

## 2016-10-20 DIAGNOSIS — C8330 Diffuse large B-cell lymphoma, unspecified site: Secondary | ICD-10-CM | POA: Insufficient documentation

## 2016-10-20 DIAGNOSIS — Z8579 Personal history of other malignant neoplasms of lymphoid, hematopoietic and related tissues: Secondary | ICD-10-CM

## 2016-10-20 DIAGNOSIS — Z8572 Personal history of non-Hodgkin lymphomas: Secondary | ICD-10-CM

## 2016-10-20 DIAGNOSIS — Z8500 Personal history of malignant neoplasm of unspecified digestive organ: Secondary | ICD-10-CM | POA: Diagnosis not present

## 2016-10-20 LAB — COMPREHENSIVE METABOLIC PANEL
ALT: 18 U/L (ref 17–63)
AST: 18 U/L (ref 15–41)
Albumin: 3.6 g/dL (ref 3.5–5.0)
Alkaline Phosphatase: 68 U/L (ref 38–126)
Anion gap: 11 (ref 5–15)
BUN: 12 mg/dL (ref 6–20)
CO2: 28 mmol/L (ref 22–32)
Calcium: 9.2 mg/dL (ref 8.9–10.3)
Chloride: 94 mmol/L — ABNORMAL LOW (ref 101–111)
Creatinine, Ser: 0.74 mg/dL (ref 0.61–1.24)
GFR calc Af Amer: >60 mL/min (ref >60)
GFR calc non Af Amer: >60 mL/min (ref >60)
Glucose, Bld: 264 mg/dL — ABNORMAL HIGH (ref 65–99)
Potassium: 3.6 mmol/L (ref 3.5–5.1)
Sodium: 133 mmol/L — ABNORMAL LOW (ref 135–145)
Total Bilirubin: 1.2 mg/dL (ref 0.3–1.2)
Total Protein: 6.2 g/dL — ABNORMAL LOW (ref 6.5–8.1)

## 2016-10-20 LAB — PSA: Prostatic Specific Antigen: <0.01 ng/mL (ref 0.00–4.00)

## 2016-10-20 LAB — CBC WITH DIFFERENTIAL/PLATELET
Basophils Absolute: 0 10*3/uL (ref 0.0–0.1)
Basophils Relative: 0 %
Eosinophils Absolute: 0 10*3/uL (ref 0.0–0.7)
Eosinophils Relative: 0 %
HCT: 38.5 % — ABNORMAL LOW (ref 39.0–52.0)
Hemoglobin: 13.8 g/dL (ref 13.0–17.0)
Lymphocytes Relative: 28 %
Lymphs Abs: 2.1 10*3/uL (ref 0.7–4.0)
MCH: 32.6 pg (ref 26.0–34.0)
MCHC: 35.8 g/dL (ref 30.0–36.0)
MCV: 91 fL (ref 78.0–100.0)
Monocytes Absolute: 0.8 10*3/uL (ref 0.1–1.0)
Monocytes Relative: 11 %
Neutro Abs: 4.5 10*3/uL (ref 1.7–7.7)
Neutrophils Relative %: 61 %
Platelets: 137 10*3/uL — ABNORMAL LOW (ref 150–400)
RBC: 4.23 MIL/uL (ref 4.22–5.81)
RDW: 13 % (ref 11.5–15.5)
WBC: 7.4 10*3/uL (ref 4.0–10.5)

## 2016-10-20 LAB — LACTATE DEHYDROGENASE: LDH: 130 U/L (ref 98–192)

## 2016-10-20 MED ORDER — DENOSUMAB 120 MG/1.7ML ~~LOC~~ SOLN
120.0000 mg | Freq: Once | SUBCUTANEOUS | Status: AC
  Administered 2016-10-20: 120 mg via SUBCUTANEOUS
  Filled 2016-10-20: qty 1.7

## 2016-10-20 NOTE — Progress Notes (Signed)
[name] presents today for injection per MD orders. Xgeva 120 mcg administered SQ in right Upper Arm. Administration without incident. Patient tolerated well. Vitals stable and discharged home from clinic ambulatory. Follow up as scheduled.

## 2016-10-20 NOTE — Patient Instructions (Signed)
[facility] at [facility] Discharge Instructions  RECOMMENDATIONS MADE BY THE CONSULTANT AND ANY TEST RESULTS WILL BE SENT TO YOUR REFERRING PHYSICIAN.  Xgeva given Follow up as scheduled.  Thank you for choosing [facility] at [facility] to provide your oncology and hematology care.  To afford each patient quality time with our provider, please arrive at least 15 minutes before your scheduled appointment time.    If you have a lab appointment with the Cancer Center please come in thru the  Main Entrance and check in at the main information desk  You need to re-schedule your appointment should you arrive 10 or more minutes late.  We strive to give you quality time with our providers, and arriving late affects you and other patients whose appointments are after yours.  Also, if you no show three or more times for appointments you may be dismissed from the clinic at the providers discretion.     Again, thank you for choosing [facility].  Our hope is that these requests will decrease the amount of time that you wait before being seen by our physicians.       _____________________________________________________________  Should you have questions after your visit to  Cancer Center, please contact our office at [phone] between the hours of 8:30 a.m. and 4:30 p.m.  Voicemails left after 4:30 p.m. will not be returned until the following business day.  For prescription refill requests, have your pharmacy contact our office.       Resources For Cancer Patients and their Caregivers ? American Cancer Society: Can assist with transportation, wigs, general needs, runs Look Good Feel Better.        1-888-227-6333 ? Cancer Care: Provides financial assistance, online support groups, medication/co-pay assistance.  1-800-813-HOPE (4673) ? [facility] Assists [place] cancer patients and their  families through emotional , educational and financial support.  336-427-4357 ? [place] DSS Where to apply for food stamps, Medicaid and utility assistance. 336-342-1394 ? [facility]: Transportation to medical appointments. 336-347-2287 ? Social Security Administration: May apply for disability if have a Stage IV cancer. 336-342-7796 1-800-772-1213 ? [place] Aging, Disability and Transit Services: Assists with nutrition, care and transit needs. 336-349-2343  Cancer Center Support Programs: @10RELATIVEDAYS@ > Cancer Support Group  2nd Tuesday of the month 1pm-2pm, Journey Room  > Creative Journey  3rd Tuesday of the month 1130am-1pm, Journey Room  > Look Good Feel Better  1st Wednesday of the month 10am-12 noon, Journey Room (Call American Cancer Society to register 1-800-395-5775)   

## 2016-10-27 ENCOUNTER — Other Ambulatory Visit: Payer: Self-pay | Admitting: Cardiology

## 2016-10-27 DIAGNOSIS — H35371 Puckering of macula, right eye: Secondary | ICD-10-CM | POA: Diagnosis not present

## 2016-10-28 ENCOUNTER — Other Ambulatory Visit: Payer: Self-pay | Admitting: Cardiology

## 2016-11-02 ENCOUNTER — Ambulatory Visit (INDEPENDENT_AMBULATORY_CARE_PROVIDER_SITE_OTHER): Payer: Medicare Other | Admitting: *Deleted

## 2016-11-02 ENCOUNTER — Telehealth: Payer: Self-pay | Admitting: Cardiology

## 2016-11-02 ENCOUNTER — Ambulatory Visit (HOSPITAL_COMMUNITY)
Admission: RE | Admit: 2016-11-02 | Discharge: 2016-11-02 | Disposition: A | Discharge status: Home / Self Care | Payer: Medicare Other | Source: Ambulatory Visit | Attending: Cardiology | Admitting: Cardiology

## 2016-11-02 DIAGNOSIS — I480 Paroxysmal atrial fibrillation: Secondary | ICD-10-CM

## 2016-11-02 DIAGNOSIS — Z5181 Encounter for therapeutic drug level monitoring: Secondary | ICD-10-CM | POA: Diagnosis not present

## 2016-11-02 DIAGNOSIS — R011 Cardiac murmur, unspecified: Secondary | ICD-10-CM

## 2016-11-02 LAB — ECHOCARDIOGRAM COMPLETE
E decel time: 169 msec
FS: 42 % (ref 28–44)
IVS/LV PW RATIO, ED: 1.22
LA ID, A-P, ES: 50 mm
LA diam end sys: 50 mm
LA diam index: 2.49 cm/m2
LA vol A4C: 67.2 ml
LA vol index: 38.3 mL/m2
LA vol: 76.9 mL
LV PW d: 8.39 mm — AB (ref 0.6–1.1)
LV dias vol index: 24 mL/m2
LV dias vol: 48 mL — AB (ref 62–150)
LV sys vol index: 8 mL/m2
LV sys vol: 17 mL — AB (ref 21–61)
LVOT SV: 74 mL
LVOT VTI: 19.6 cm
LVOT area: 3.8 cm2
LVOT diameter: 22 mm
LVOT peak grad rest: 4 mmHg
LVOT peak vel: 100 cm/s
MV Dec: 169
MV Peak grad: 8 mmHg
MV pk A vel: 44.9 m/s
MV pk E vel: 137 m/s
Simpson's disk: 66
Stroke v: 32 ml
TAPSE: 14.3 mm

## 2016-11-02 LAB — POCT INR: INR: 2.2

## 2016-11-02 NOTE — Progress Notes (Signed)
*  PRELIMINARY RESULTS* Echocardiogram 2D Echocardiogram has been performed.  [name] 11/02/2016, 10:34 AM

## 2016-11-02 NOTE — Telephone Encounter (Signed)
Pt's daughter [name] lost Mr. Sada dosing instructions, please give her a call @ [phone]

## 2016-11-02 NOTE — Telephone Encounter (Signed)
Left message for [name] on her cell phone with pt instructions and called and spoke with pt and gave him instructions and next appt date and time.

## 2016-11-03 DIAGNOSIS — B351 Tinea unguium: Secondary | ICD-10-CM | POA: Diagnosis not present

## 2016-11-03 DIAGNOSIS — E1142 Type 2 diabetes mellitus with diabetic polyneuropathy: Secondary | ICD-10-CM | POA: Diagnosis not present

## 2016-11-04 DIAGNOSIS — I10 Essential (primary) hypertension: Secondary | ICD-10-CM | POA: Diagnosis not present

## 2016-11-04 DIAGNOSIS — E1165 Type 2 diabetes mellitus with hyperglycemia: Secondary | ICD-10-CM | POA: Diagnosis not present

## 2016-11-04 DIAGNOSIS — E119 Type 2 diabetes mellitus without complications: Secondary | ICD-10-CM | POA: Diagnosis not present

## 2016-11-04 DIAGNOSIS — I482 Chronic atrial fibrillation: Secondary | ICD-10-CM | POA: Diagnosis not present

## 2016-11-04 DIAGNOSIS — C8580 Other specified types of non-Hodgkin lymphoma, unspecified site: Secondary | ICD-10-CM | POA: Diagnosis not present

## 2016-11-12 ENCOUNTER — Telehealth: Payer: Self-pay | Admitting: Cardiology

## 2016-11-12 ENCOUNTER — Other Ambulatory Visit: Payer: Self-pay

## 2016-11-12 MED ORDER — POTASSIUM CHLORIDE CRYS ER 10 MEQ PO TBCR: 10.0000 meq | EXTENDED_RELEASE_TABLET | Freq: Every day | ORAL | 1 refills | Status: DC

## 2016-11-12 NOTE — Telephone Encounter (Signed)
°*  STAT* If patient is at the pharmacy, call can be transferred to refill team.   1. Which medications need to be refilled? (please list name of each medication and dose if known) KLOR-CON M10 10 MEQ tablet [[id number]]    2. Which pharmacy/location (including street and city if local pharmacy) is medication to be sent to?  3. Do they need a 30 day or 90 day supply?  90 day supply

## 2016-11-17 ENCOUNTER — Encounter (HOSPITAL_BASED_OUTPATIENT_CLINIC_OR_DEPARTMENT_OTHER): Payer: Medicare Other | Admitting: Oncology

## 2016-11-17 ENCOUNTER — Encounter (HOSPITAL_COMMUNITY): Payer: Medicare Other

## 2016-11-17 ENCOUNTER — Encounter (HOSPITAL_COMMUNITY): Payer: Self-pay

## 2016-11-17 ENCOUNTER — Encounter (HOSPITAL_BASED_OUTPATIENT_CLINIC_OR_DEPARTMENT_OTHER): Payer: Medicare Other

## 2016-11-17 DIAGNOSIS — Z8571 Personal history of Hodgkin lymphoma: Secondary | ICD-10-CM | POA: Diagnosis not present

## 2016-11-17 DIAGNOSIS — C49A0 Gastrointestinal stromal tumor, unspecified site: Secondary | ICD-10-CM

## 2016-11-17 DIAGNOSIS — C61 Malignant neoplasm of prostate: Secondary | ICD-10-CM

## 2016-11-17 DIAGNOSIS — C7951 Secondary malignant neoplasm of bone: Secondary | ICD-10-CM | POA: Diagnosis not present

## 2016-11-17 DIAGNOSIS — Z8500 Personal history of malignant neoplasm of unspecified digestive organ: Secondary | ICD-10-CM | POA: Diagnosis not present

## 2016-11-17 DIAGNOSIS — C8330 Diffuse large B-cell lymphoma, unspecified site: Secondary | ICD-10-CM | POA: Diagnosis not present

## 2016-11-17 DIAGNOSIS — Z7984 Long term (current) use of oral hypoglycemic drugs: Secondary | ICD-10-CM | POA: Diagnosis not present

## 2016-11-17 LAB — COMPREHENSIVE METABOLIC PANEL
ALT: 17 U/L (ref 17–63)
AST: 17 U/L (ref 15–41)
Albumin: 3.6 g/dL (ref 3.5–5.0)
Alkaline Phosphatase: 75 U/L (ref 38–126)
Anion gap: 9 (ref 5–15)
BUN: 13 mg/dL (ref 6–20)
CO2: 27 mmol/L (ref 22–32)
Calcium: 9.6 mg/dL (ref 8.9–10.3)
Chloride: 100 mmol/L — ABNORMAL LOW (ref 101–111)
Creatinine, Ser: 0.74 mg/dL (ref 0.61–1.24)
GFR calc Af Amer: >60 mL/min (ref >60)
GFR calc non Af Amer: >60 mL/min (ref >60)
Glucose, Bld: 253 mg/dL — ABNORMAL HIGH (ref 65–99)
Potassium: 3.8 mmol/L (ref 3.5–5.1)
Sodium: 136 mmol/L (ref 135–145)
Total Bilirubin: 1 mg/dL (ref 0.3–1.2)
Total Protein: 6.4 g/dL — ABNORMAL LOW (ref 6.5–8.1)

## 2016-11-17 MED ORDER — DENOSUMAB 120 MG/1.7ML ~~LOC~~ SOLN
120.0000 mg | Freq: Once | SUBCUTANEOUS | Status: AC
  Administered 2016-11-17: 120 mg via SUBCUTANEOUS
  Filled 2016-11-17: qty 1.7

## 2016-11-17 NOTE — Patient Instructions (Signed)
[facility] at [facility] Discharge Instructions  RECOMMENDATIONS MADE BY THE CONSULTANT AND ANY TEST RESULTS WILL BE SENT TO YOUR REFERRING PHYSICIAN.  Received Xgeva injection today. Follow-up as scheduled. Call clinic for any questions or concerns  Thank you for choosing [facility] at [facility] to provide your oncology and hematology care.  To afford each patient quality time with our provider, please arrive at least 15 minutes before your scheduled appointment time.    If you have a lab appointment with the Cancer Center please come in thru the  Main Entrance and check in at the main information desk  You need to re-schedule your appointment should you arrive 10 or more minutes late.  We strive to give you quality time with our providers, and arriving late affects you and other patients whose appointments are after yours.  Also, if you no show three or more times for appointments you may be dismissed from the clinic at the providers discretion.     Again, thank you for choosing [facility].  Our hope is that these requests will decrease the amount of time that you wait before being seen by our physicians.       _____________________________________________________________  Should you have questions after your visit to [facility], please contact our office at [phone] between the hours of 8:30 a.m. and 4:30 p.m.  Voicemails left after 4:30 p.m. will not be returned until the following business day.  For prescription refill requests, have your pharmacy contact our office.       Resources For Cancer Patients and their Caregivers ? American Cancer Society: Can assist with transportation, wigs, general needs, runs Look Good Feel Better.        1-888-227-6333 ? Cancer Care: Provides financial assistance, online support groups, medication/co-pay assistance.  1-800-813-HOPE (4673) ? [facility] Assists [place] cancer patients and their families through emotional , educational and financial support.  336-427-4357 ? [place] DSS Where to apply for food stamps, Medicaid and utility assistance. 336-342-1394 ? [facility]: Transportation to medical appointments. 336-347-2287 ? Social Security Administration: May apply for disability if have a Stage IV cancer. 336-342-7796 1-800-772-1213 ? [place] Aging, Disability and Transit Services: Assists with nutrition, care and transit needs. 336-349-2343  Cancer Center Support Programs: @10RELATIVEDAYS@ > Cancer Support Group  2nd Tuesday of the month 1pm-2pm, Journey Room  > Creative Journey  3rd Tuesday of the month 1130am-1pm, Journey Room  > Look Good Feel Better  1st Wednesday of the month 10am-12 noon, Journey Room (Call American Cancer Society to register 1-800-395-5775)   

## 2016-11-17 NOTE — Progress Notes (Signed)
Pt here today for Xgeva injections. Pt given injection in right arm. Pt tolerated well. Pt stable and discharged home ambulatory with wife.

## 2016-11-17 NOTE — Progress Notes (Signed)
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 Gens 83 y.o. male presents for routine follow-up for several malignancies including metastatic prostate cancer, and history of DLBCL and GIST of small bowel.   Patient states he is doing well. He has been taking his zytiga and prednisone as prescribed. He is still concerned about hyperglycemia due to the prednisone, since he is only on metformin and glipizide, but he states he has been doing better about watching his sugar intake. He denies any bone pain. He continues to have intermittent dizziness with positional changes.    REVIEW OF SYSTEMS:  Review of Systems  Constitutional: Negative for appetite change, chills, fatigue and fever.  HENT:  Negative.   Eyes: Negative.   Respiratory: Negative.  Negative for cough and shortness of breath.   Cardiovascular: Negative.  Negative for chest pain.  Gastrointestinal: Negative for abdominal pain, blood in stool, constipation, diarrhea, nausea and vomiting.  Endocrine: Negative for hot flashes.  Genitourinary: Negative for bladder incontinence, dysuria, hematuria and nocturia.   Musculoskeletal: Negative.   Skin: Negative.  Negative for rash.  Neurological: Positive for dizziness. Negative for headaches.  Hematological: Bruises/bleeds easily.  Psychiatric/Behavioral: Negative.      PAST MEDICAL/SURGICAL HISTORY:  Past Medical History:  Diagnosis Date  . Atrial fibrillation ([place])    Dr. Harl Bowie- [facility] follows saw 11'14  .  Bladder stones    tx. with oral meds and antibiotics, now surgery planned  . Cataracts, both eyes    surgery planned May 2015  . Diabetes mellitus without complication ([place])   . 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 Margot Chimes, thus far without recurrence.   . Hypertension   . NHL (non-Hodgkin's lymphoma) ([place]) 08/18/2011   Diffuse large B-cell lymphoma, clinically stage IIIA, CD20 positive, status post cervical lymph node biopsy 08/03/2003 on the left. PET scan was also positive in the spleen and small bowel region, but bone marrow aspiration and biopsy were negative. So he essentially had stage IIIAs. He received R-CHOP x6 cycles with CR established by PET scan criteria on 11/23/2003 with no evidence for relapse th   Past Surgical History:  Procedure Laterality Date  . CATARACT EXTRACTION Right 08/2013  . CHOLECYSTECTOMY    . CYSTOSCOPY WITH LITHOLAPAXY N/A 08/10/2013   Procedure: CYSTOSCOPY WITH LITHOLAPAXY WITH [name];  Surgeon: [name], MD;  Location: [facility];  Service: Urology;  Laterality: N/A;  . ESOPHAGEAL DILATION N/A 02/27/2016   Procedure: ESOPHAGEAL DILATION;  Surgeon: [name], MD;  Location: [facility];  Service: Endoscopy;  Laterality: N/A;  . ESOPHAGOGASTRODUODENOSCOPY N/A 02/27/2016   Procedure: ESOPHAGOGASTRODUODENOSCOPY (EGD);  Surgeon: [name], MD;  Location: [facility];  Service: Endoscopy;  Laterality: N/A;  12:00  . INCISION AND DRAINAGE PERIRECTAL ABSCESS    . LYMPH NODE DISSECTION  Left    '05  . PORT-A-CATH REMOVAL    . PORTACATH PLACEMENT     insertion and removal -last chemotherapy 10 yrs ago  . TRANSURETHRAL RESECTION OF PROSTATE N/A 08/10/2013   Procedure: TRANSURETHRAL RESECTION OF THE  PROSTATE WITH GYRUS INSTRUMENTS;  Surgeon: [name], MD;  Location: [facility];  Service: Urology;  Laterality: N/A;     SOCIAL HISTORY:  Social History   Social History  . Marital status: Married    Spouse name: N/A  . Number of children: N/A  . Years of education: N/A   Occupational History  . retired Retired    Hotel manager   Social History Main Topics  . Smoking status: Never Smoker  . Smokeless tobacco: Never Used  . Alcohol use No  . Drug use: No  . Sexual activity: Not Currently   Other Topics Concern  . Not on file   Social History Narrative  . No narrative on file    FAMILY HISTORY:  No family history on file.  CURRENT MEDICATIONS:  Outpatient Encounter Prescriptions as of 11/17/2016  Medication Sig  . abiraterone Acetate (ZYTIGA) 250 MG tablet Take 4 tablets (1,000 mg total) by mouth daily. Take on an empty stomach 1 hour before or 2 hours after a meal  . acetaminophen (TYLENOL) 500 MG tablet Take 500-1,000 mg by mouth daily as needed for mild pain, fever or headache.  Marland Kitchen amitriptyline (ELAVIL) 25 MG tablet Take 25 mg by mouth at bedtime.  . Calcium Carb-Cholecalciferol 600-800 MG-UNIT TABS Take 1 capsule by mouth daily.   . Cholecalciferol (VITAMIN D3) 1000 units CAPS Take 1,000 Units by mouth daily.  . clotrimazole-betamethasone (LOTRISONE) cream Apply 1 application topically daily as needed (fungus).   . CRANBERRY PO Take 4,200 mg by mouth daily.  Marland Kitchen denosumab (XGEVA) 120 MG/1.7ML SOLN injection Inject 120 mg into the skin every 30 (thirty) days.   Marland Kitchen diltiazem (CARDIZEM) 90 MG tablet TAKE ONE TABLET BY MOUTH TWICE DAILY  . docusate sodium (COLACE) 100 MG capsule Take 100 mg by mouth daily as needed for mild constipation.  Marland Kitchen glipiZIDE (GLUCOTROL XL) 5 MG 24 hr tablet Take 5 mg by mouth daily with breakfast.   . Leuprolide Acetate, 6 Month, (LUPRON DEPOT) 45 MG injection Inject 45 mg into the muscle every 6 (six) months.  Marland Kitchen lisinopril-hydrochlorothiazide  (PRINZIDE,ZESTORETIC) 20-12.5 MG tablet Take 1 tablet by mouth daily.  Marland Kitchen loperamide (IMODIUM) 2 MG capsule Take 2 mg by mouth as needed for diarrhea or loose stools.  . metFORMIN (GLUCOPHAGE) 500 MG tablet Take 850 mg by mouth 2 (two) times daily with a meal.   . nystatin (MYCOSTATIN) powder Apply topically 4 (four) times daily as needed (fungus).   Marland Kitchen omeprazole (PRILOSEC) 20 MG capsule Take 20 mg by mouth daily.   . potassium chloride (KLOR-CON M10) 10 MEQ tablet Take 1 tablet (10 mEq total) by mouth daily.  . potassium chloride (KLOR-CON M10) 10 MEQ tablet Take 1 tablet (10 mEq total) by mouth daily.  . predniSONE (DELTASONE) 5 MG tablet TAKE ONE TABLET BY MOUTH TWICE DAILY WITH A MEAL  . sennosides-docusate sodium (SENOKOT-S) 8.6-50 MG tablet Take 1 tablet by mouth daily.  Marland Kitchen warfarin (COUMADIN) 2.5 MG tablet Take as directed by Coumadin Clinic.   Facility-Administered Encounter Medications as of 11/17/2016  Medication  . denosumab (XGEVA) injection 120 mg    ALLERGIES:  No Known Allergies   PHYSICAL EXAM:  ECOG Performance status: 1-2 - Symptomatic; requires occasional assistance,  but is mostly independent.   Vitals reviewed.   Physical Exam  Constitutional: He is oriented to person, place, and time and well-developed, well-nourished, and in no distress.  HENT:  Head: Normocephalic.  Mouth/Throat: Oropharynx is clear and moist. No oropharyngeal exudate.  Eyes: Pupils are equal, round, and reactive to light. Conjunctivae are normal. No scleral icterus.  Neck: Normal range of motion. Neck supple.  Cardiovascular: Normal rate, regular rhythm and normal heart sounds.   Pulmonary/Chest: Effort normal and breath sounds normal. No respiratory distress. He has no wheezes.  Abdominal: Soft. Bowel sounds are normal. There is no tenderness.  Musculoskeletal: Normal range of motion. He exhibits no edema.  Lymphadenopathy:    He has no cervical adenopathy.       Right: No supraclavicular  adenopathy present.       Left: No supraclavicular adenopathy present.  Neurological: He is alert and oriented to person, place, and time. No cranial nerve deficit.  Slightly shuffling gait   Skin: Skin is warm and dry. No rash noted.  Psychiatric: Mood, memory, affect and judgment normal.  Nursing note and vitals reviewed.    LABORATORY DATA:  I have reviewed the labs as listed.  CBC    Component Value Date/Time   WBC 7.4 10/20/2016 1042   RBC 4.23 10/20/2016 1042   HGB 13.8 10/20/2016 1042   HCT 38.5 (L) 10/20/2016 1042   PLT 137 (L) 10/20/2016 1042   MCV 91.0 10/20/2016 1042   MCH 32.6 10/20/2016 1042   MCHC 35.8 10/20/2016 1042   RDW 13.0 10/20/2016 1042   LYMPHSABS 2.1 10/20/2016 1042   MONOABS 0.8 10/20/2016 1042   EOSABS 0.0 10/20/2016 1042   BASOSABS 0.0 10/20/2016 1042   CMP Latest Ref Rng & Units 11/17/2016 10/20/2016 09/22/2016  Glucose 65 - 99 mg/dL 253(H) 264(H) 245(H)  BUN 6 - 20 mg/dL 13 12 13   Creatinine 0.61 - 1.24 mg/dL 0.74 0.74 0.81  Sodium 135 - 145 mmol/L 136 133(L) 135  Potassium 3.5 - 5.1 mmol/L 3.8 3.6 3.7  Chloride 101 - 111 mmol/L 100(L) 94(L) 97(L)  CO2 22 - 32 mmol/L 27 28 29   Calcium 8.9 - 10.3 mg/dL 9.6 9.2 9.4  Total Protein 6.5 - 8.1 g/dL 6.4(L) 6.2(L) 6.4(L)  Total Bilirubin 0.3 - 1.2 mg/dL 1.0 1.2 1.2  Alkaline Phos 38 - 126 U/L 75 68 67  AST 15 - 41 U/L 17 18 21   ALT 17 - 63 U/L 17 18 20     PENDING LABS:  PSA pending   DIAGNOSTIC IMAGING:  Last bone scan: 08/26/15    PATHOLOGY:  Bowel resection path: 02/14/04     Prostate biopsy: 08/10/13    ASSESSMENT & PLAN:   Stage IV metastatic prostate cancer:  -Diagnosed 07/2013 revealing prostate adenocarcinoma with perineural invasion, Gleason 7 (4+3); now with bone mets.  -Continue Zytiga/prednisone.   -Continue Lupron every 6 months; next injection due 02/2017.  -Return for follow-up visit in 3 months with labs.   Bone mets:  -Last bone scan in 08/2015 with improving abnormal  activity in lower lumbar spine and sternum; stable abnormal activity in right side of T12 vertebral body with no new areas of abnormal activity.  -Continue Xgeva injections monthly (his last Xgeva injection was given on 04/01/16); he will receive Xgeva today.  GIST:  -Diagnosed in 01/2004; s/p partial resection. No evidence of recurrence since that time.  -Continue to monitor; can image as clinically indicated, but no routine imaging necessary.   DLBCL:  -  Diagnosed in 2005. No lymphadenopathy. No anemia or thrombocytopenia. No leukocytosis. No "B" symptoms including fever, chills, or night sweats.  -Continue to monitor.     Dispo:  -Monthly labs with Xgeva injections.  -Next dose of Lupron due in 02/2017.  -Return to cancer center for follow-up visit in 3 months with labs.    All questions were answered to patient's stated satisfaction. Encouraged patient to call with any new concerns or questions before his next visit to the cancer center and we can certain see him sooner, if needed.       Orders placed this encounter:  Orders Placed This Encounter  Procedures  . CBC with Differential  . Comprehensive metabolic panel  . PSA     Twana First, MD

## 2016-11-30 ENCOUNTER — Ambulatory Visit (INDEPENDENT_AMBULATORY_CARE_PROVIDER_SITE_OTHER): Payer: Medicare Other | Admitting: *Deleted

## 2016-11-30 DIAGNOSIS — I480 Paroxysmal atrial fibrillation: Secondary | ICD-10-CM

## 2016-11-30 DIAGNOSIS — I4891 Unspecified atrial fibrillation: Secondary | ICD-10-CM | POA: Diagnosis not present

## 2016-11-30 DIAGNOSIS — Z5181 Encounter for therapeutic drug level monitoring: Secondary | ICD-10-CM

## 2016-11-30 LAB — POCT INR: INR: 2.8

## 2016-12-05 ENCOUNTER — Encounter (HOSPITAL_COMMUNITY): Payer: Self-pay | Admitting: *Deleted

## 2016-12-05 ENCOUNTER — Ambulatory Visit (HOSPITAL_COMMUNITY)
Admission: EM | Admit: 2016-12-05 | Discharge: 2016-12-05 | Disposition: A | Discharge status: Home / Self Care | Payer: Medicare Other | Attending: Internal Medicine | Admitting: Internal Medicine

## 2016-12-05 DIAGNOSIS — S50811A Abrasion of right forearm, initial encounter: Secondary | ICD-10-CM

## 2016-12-05 DIAGNOSIS — S51811A Laceration without foreign body of right forearm, initial encounter: Secondary | ICD-10-CM

## 2016-12-05 DIAGNOSIS — S51819A Laceration without foreign body of unspecified forearm, initial encounter: Secondary | ICD-10-CM

## 2016-12-05 DIAGNOSIS — W19XXXA Unspecified fall, initial encounter: Secondary | ICD-10-CM | POA: Diagnosis not present

## 2016-12-05 MED ORDER — CEPHALEXIN 500 MG PO CAPS: 500.0000 mg | ORAL_CAPSULE | Freq: Four times a day (QID) | ORAL | 0 refills | Status: DC

## 2016-12-05 NOTE — ED Triage Notes (Addendum)
Patient reports falling yesterday, hitting right medial aspect of right forearm. Skin tear with bruising around the area. Patient is coumadin. Patient is able to make fist and use right arm. CMS intact distally. No warmth around skin tear. No deformity noted to forearm.

## 2016-12-05 NOTE — ED Provider Notes (Addendum)
[facility]    CSN: [id number] Arrival date & time: 12/05/16  1538     History   Chief Complaint Chief Complaint  Patient presents with  . Abrasion    HPI BREVYN RING is a 83 y.o. male.   4-year-old male accompanied by his wife presents the urgent care 1 day after he had fallen on the floor yesterday. He suffered a skin tear to the right forearm. He is on Coumadin and they were concerned about how to care for skin tear and bruising. He is currently having no bleeding. And this apparently was not a big problem after the fall. He said he bumped the left side of his head but there was no loss of consciousness and he has no symptoms of sequelae. No headache. He is fully awake alert oriented talkative, lucid with excellent memory and clear speech.      Past Medical History:  Diagnosis Date  . Atrial fibrillation ([place])    Dr. Harl Bowie- [facility] follows saw 11'14  . Bladder stones    tx. with oral meds and antibiotics, now surgery planned  . Cataracts, both eyes    surgery planned May 2015  . Diabetes mellitus without complication ([place])   . GIST (gastrointestinal stromal tumor), malignant ([place]) 08/18/2011   Gastrointestinal stromal tumor, that is GIST, small bowel, 4.5 cm, intermediate prognostic grade found on the PET scan in the small bowel, accounting for that small bowel activity in October 2005 with resection by Dr. Margot Chimes, thus far without recurrence.   . Hypertension   . NHL (non-Hodgkin's lymphoma) ([place]) 08/18/2011   Diffuse large B-cell lymphoma, clinically stage IIIA, CD20 positive, status post cervical lymph node biopsy 08/03/2003 on the left. PET scan was also positive in the spleen and small bowel region, but bone marrow aspiration and biopsy were negative. So he essentially had stage IIIAs. He received R-CHOP x6 cycles with CR established by PET scan criteria on 11/23/2003 with no evidence for relapse th    Patient Active Problem List   Diagnosis Date Noted    . Other dysphagia 01/21/2016  . Bone metastases ([place]) 08/21/2015  . Prostate cancer ([place]) 09/08/2013  . Hypertrophy of prostate with urinary obstruction and other lower urinary tract symptoms (LUTS) 08/10/2013  . Encounter for therapeutic drug monitoring 06/12/2013  . NHL (non-Hodgkin's lymphoma) ([place]) 08/18/2011  . GIST (gastrointestinal stromal tumor), malignant ([place]) 08/18/2011  . Long term (current) use of anticoagulants 07/18/2010  . HYPERTENSION, UNSPECIFIED 10/04/2008  . ATRIAL FIBRILLATION 10/04/2008    Past Surgical History:  Procedure Laterality Date  . CATARACT EXTRACTION Right 08/2013  . CHOLECYSTECTOMY    . CYSTOSCOPY WITH LITHOLAPAXY N/A 08/10/2013   Procedure: CYSTOSCOPY WITH LITHOLAPAXY WITH [name];  Surgeon: [name], MD;  Location: [facility];  Service: Urology;  Laterality: N/A;  . ESOPHAGEAL DILATION N/A 02/27/2016   Procedure: ESOPHAGEAL DILATION;  Surgeon: [name], MD;  Location: [facility];  Service: Endoscopy;  Laterality: N/A;  . ESOPHAGOGASTRODUODENOSCOPY N/A 02/27/2016   Procedure: ESOPHAGOGASTRODUODENOSCOPY (EGD);  Surgeon: [name], MD;  Location: [facility];  Service: Endoscopy;  Laterality: N/A;  12:00  . INCISION AND DRAINAGE PERIRECTAL ABSCESS    . LYMPH NODE DISSECTION Left    '05  . PORT-A-CATH REMOVAL    . PORTACATH PLACEMENT     insertion and removal -last chemotherapy 10 yrs ago  . TRANSURETHRAL RESECTION OF PROSTATE N/A 08/10/2013   Procedure: TRANSURETHRAL RESECTION OF THE PROSTATE WITH GYRUS INSTRUMENTS;  Surgeon: [name], MD;  Location: [facility];  Service: Urology;  Laterality: N/A;       Home Medications    Prior to Admission medications   Medication Sig Start Date End Date Taking? Authorizing Provider  abiraterone Acetate (ZYTIGA) 250 MG tablet Take 4 tablets (1,000 mg total) by mouth daily. Take on an empty stomach 1 hour before or 2 hours after a meal 07/24/15   Kefalas, Manon Hilding, PA-C   acetaminophen (TYLENOL) 500 MG tablet Take 500-1,000 mg by mouth daily as needed for mild pain, fever or headache.    [provider]  amitriptyline (ELAVIL) 25 MG tablet Take 25 mg by mouth at bedtime.    [provider]  Calcium Carb-Cholecalciferol 600-800 MG-UNIT TABS Take 1 capsule by mouth daily.     [provider]  Cholecalciferol (VITAMIN D3) 1000 units CAPS Take 1,000 Units by mouth daily.    [provider]  clotrimazole-betamethasone (LOTRISONE) cream Apply 1 application topically daily as needed (fungus).  06/18/15   [provider]  CRANBERRY PO Take 4,200 mg by mouth daily.    [provider]  denosumab (XGEVA) 120 MG/1.7ML SOLN injection Inject 120 mg into the skin every 30 (thirty) days.     [provider]  diltiazem (CARDIZEM) 90 MG tablet TAKE ONE TABLET BY MOUTH TWICE DAILY 10/27/16   Arnoldo Lenis, MD  docusate sodium (COLACE) 100 MG capsule Take 100 mg by mouth daily as needed for mild constipation.    [provider]  glipiZIDE (GLUCOTROL XL) 5 MG 24 hr tablet Take 5 mg by mouth daily with breakfast.  06/25/15   [provider]  Leuprolide Acetate, 6 Month, (LUPRON DEPOT) 45 MG injection Inject 45 mg into the muscle every 6 (six) months.    [provider]  lisinopril-hydrochlorothiazide (PRINZIDE,ZESTORETIC) 20-12.5 MG tablet Take 1 tablet by mouth daily. 10/05/16   Arnoldo Lenis, MD  loperamide (IMODIUM) 2 MG capsule Take 2 mg by mouth as needed for diarrhea or loose stools.    [provider]  metFORMIN (GLUCOPHAGE) 500 MG tablet Take 850 mg by mouth 2 (two) times daily with a meal.  03/15/15   [provider]  nystatin (MYCOSTATIN) powder Apply topically 4 (four) times daily as needed (fungus).     [provider]  omeprazole (PRILOSEC) 20 MG capsule Take 20 mg by mouth daily.     [provider]  potassium chloride (KLOR-CON M10) 10 MEQ  tablet Take 1 tablet (10 mEq total) by mouth daily. 05/13/16   Penland, Kelby Fam, MD  potassium chloride (KLOR-CON M10) 10 MEQ tablet Take 1 tablet (10 mEq total) by mouth daily. 11/12/16   Arnoldo Lenis, MD  predniSONE (DELTASONE) 5 MG tablet TAKE ONE TABLET BY MOUTH TWICE DAILY WITH A MEAL 07/30/16   Baird Cancer, PA-C  sennosides-docusate sodium (SENOKOT-S) 8.6-50 MG tablet Take 1 tablet by mouth daily.    [provider]  warfarin (COUMADIN) 2.5 MG tablet Take as directed by [facility]. 02/28/16   [name], MD    Family History History reviewed. No pertinent family history.  Social History Social History  Substance Use Topics  . Smoking status: Never Smoker  . Smokeless tobacco: Never Used  . Alcohol use No     Allergies   Patient has no known allergies.   Review of Systems Review of Systems  Constitutional: Negative.   Respiratory: Negative.   Cardiovascular: Negative for chest  pain.  Gastrointestinal: Negative.   Skin: Positive for wound.  Neurological: Negative for dizziness, tremors, seizures, syncope, numbness and headaches.  Hematological: Bruises/bleeds easily.  Psychiatric/Behavioral: Negative.   All other systems reviewed and are negative.    Physical Exam Triage Vital Signs ED Triage Vitals [12/05/16 1625]  Enc Vitals Group     BP (!) 158/85     Pulse Rate 78     Resp 16     Temp      Temp src      SpO2 100 %     Weight      Height      Head Circumference      Peak Flow      Pain Score      Pain Loc      Pain Edu?      Excl. in [place]?    No data found.   Updated Vital Signs BP (!) 158/85 (BP Location: Left Arm)   Pulse 78   Resp 16   SpO2 100%   Visual Acuity Right Eye Distance:   Left Eye Distance:   Bilateral Distance:    Right Eye Near:   Left Eye Near:    Bilateral Near:     Physical Exam  Constitutional: He is oriented to person, place, and time. He appears well-developed and well-nourished. No  distress.  Eyes: EOM are normal.  Neck: Normal range of motion. Neck supple.  Cardiovascular: Normal rate.   Pulmonary/Chest: Effort normal.  Musculoskeletal: He exhibits no edema or deformity.  Neurological: He is alert and oriented to person, place, and time.  Skin: Skin is warm and dry.  Approximately 4 x 3 cm area of a skin tear to the right forearm. No active bleeding. The overlying skin is darkened and covering the wound. They had applied the OTC medicines as skin which had essentially "glued" the skin to the wound. There is a portion of abrasion that is pink but no signs of infection. Surrounding the wound is cutaneous ecchymosis. No lymphangitis or cellulitis. No drainage.  Psychiatric: He has a normal mood and affect.  Nursing note and vitals reviewed.    UC Treatments / Results  Labs (all labs ordered are listed, but only abnormal results are displayed) Labs Reviewed - No data to display  EKG  EKG Interpretation None       Radiology No results found.  Procedures Procedures (including critical care time)  Medications Ordered in UC Medications - No data to display   Initial Impression / Assessment and Plan / UC Course  I have reviewed the triage vital signs and the nursing notes.  Pertinent labs & imaging results that were available during my care of the patient were reviewed by me and considered in my medical decision making (see chart for details).     Clean wound daily with warm soapy and lightly salty water. Then pad dry. Tomorrow he may use Neosporin ointment and a nonstick bandage. After that he will not need to use Neosporin. Keep clean and dry. Watch for any signs of infection. For worsening, new problems symptoms may return.   Final Clinical Impressions(s) / UC Diagnoses   Final diagnoses:  Fall, initial encounter  Abrasion of right forearm, initial encounter  Skin tear of forearm without complication, initial encounter    New Prescriptions New  Prescriptions   No medications on file   Up-to-date on tetanus. Received this year  Controlled Substance Prescriptions [place] Controlled Substance Registry consulted? Not Applicable  Janne Napoleon, NP 12/05/16 Ladoris Gene    Janne Napoleon, NP 12/05/16 Jeri Lager

## 2016-12-05 NOTE — Discharge Instructions (Signed)
Clean wound daily with warm soapy and lightly salty water. Then pad dry. Tomorrow he may use Neosporin ointment and a nonstick bandage. After that he will not need to use Neosporin. Keep clean and dry. Watch for any signs of infection. For worsening, new problems symptoms may return.

## 2016-12-11 ENCOUNTER — Telehealth (HOSPITAL_COMMUNITY): Payer: Self-pay

## 2016-12-11 DIAGNOSIS — S61411S Laceration without foreign body of right hand, sequela: Secondary | ICD-10-CM | POA: Diagnosis not present

## 2016-12-11 DIAGNOSIS — E1165 Type 2 diabetes mellitus with hyperglycemia: Secondary | ICD-10-CM | POA: Diagnosis not present

## 2016-12-11 DIAGNOSIS — W19XXXD Unspecified fall, subsequent encounter: Secondary | ICD-10-CM | POA: Diagnosis not present

## 2016-12-11 NOTE — Telephone Encounter (Signed)
Patients daughter, [name], called and said that her sister has recently seen a genetic counselor at [facility] because she was the 3rd sister diagnosed with DCIS. The sisters told the genetic counselor that their Dad also had prostate cancer. The geneticist explained that it would be great if they could get their dad tested because the prostate and breast cancers were so close and it would be wonderful information for the family to have. The daughter wants to know if the provider will order a genetics referral for patient. They would also like to go to the same genetic counselor the sisters saw recently. Explained to patients daughter that I would review with provider and call her back next week. She verbalized understanding.

## 2016-12-15 ENCOUNTER — Encounter (HOSPITAL_COMMUNITY): Payer: Self-pay

## 2016-12-15 ENCOUNTER — Encounter (HOSPITAL_COMMUNITY): Payer: Medicare Other

## 2016-12-15 ENCOUNTER — Encounter (HOSPITAL_COMMUNITY): Payer: Medicare Other | Attending: Adult Health

## 2016-12-15 ENCOUNTER — Other Ambulatory Visit (HOSPITAL_COMMUNITY): Payer: Self-pay

## 2016-12-15 VITALS — BP 142/73 | HR 95 | Temp 98.0°F | Resp 16

## 2016-12-15 DIAGNOSIS — C7951 Secondary malignant neoplasm of bone: Secondary | ICD-10-CM

## 2016-12-15 DIAGNOSIS — C61 Malignant neoplasm of prostate: Secondary | ICD-10-CM | POA: Diagnosis not present

## 2016-12-15 DIAGNOSIS — C8590 Non-Hodgkin lymphoma, unspecified, unspecified site: Secondary | ICD-10-CM

## 2016-12-15 DIAGNOSIS — C49A0 Gastrointestinal stromal tumor, unspecified site: Secondary | ICD-10-CM

## 2016-12-15 LAB — COMPREHENSIVE METABOLIC PANEL
ALT: 20 U/L (ref 17–63)
AST: 18 U/L (ref 15–41)
Albumin: 3.8 g/dL (ref 3.5–5.0)
Alkaline Phosphatase: 67 U/L (ref 38–126)
Anion gap: 8 (ref 5–15)
BUN: 16 mg/dL (ref 6–20)
CO2: 31 mmol/L (ref 22–32)
Calcium: 10.2 mg/dL (ref 8.9–10.3)
Chloride: 99 mmol/L — ABNORMAL LOW (ref 101–111)
Creatinine, Ser: 0.79 mg/dL (ref 0.61–1.24)
GFR calc Af Amer: >60 mL/min (ref >60)
GFR calc non Af Amer: >60 mL/min (ref >60)
Glucose, Bld: 195 mg/dL — ABNORMAL HIGH (ref 65–99)
Potassium: 3.6 mmol/L (ref 3.5–5.1)
Sodium: 138 mmol/L (ref 135–145)
Total Bilirubin: 1.1 mg/dL (ref 0.3–1.2)
Total Protein: 6.6 g/dL (ref 6.5–8.1)

## 2016-12-15 MED ORDER — DENOSUMAB 120 MG/1.7ML ~~LOC~~ SOLN
120.0000 mg | Freq: Once | SUBCUTANEOUS | Status: AC
Start: 2016-12-15 — End: 2016-12-15
  Administered 2016-12-15: 120 mg via SUBCUTANEOUS
  Filled 2016-12-15: qty 1.7

## 2016-12-15 NOTE — Progress Notes (Signed)
Patient received xgeva today.  Labs reviewed.  Tolerated shot with no complaints voiced.  Site clean and dry with no bruising noted.  VSS with discharge.  Patient left ambulatory in stable condition.    Patient denied jaw,leg, or tooth pain.  Taking calcium as directed with no dental appointments in the next month.

## 2016-12-15 NOTE — Patient Instructions (Signed)
[place] at [facility]  Discharge Instructions:  You received xgeva today.  Keep scheduled appointments.  _______________________________________________________________  Thank you for choosing [facility] at [facility] to provide your oncology and hematology care.  To afford each patient quality time with our providers, please arrive at least 15 minutes before your scheduled appointment.  You need to re-schedule your appointment if you arrive 10 or more minutes late.  We strive to give you quality time with our providers, and arriving late affects you and other patients whose appointments are after yours.  Also, if you no show three or more times for appointments you may be dismissed from the clinic.  Again, thank you for choosing [place] [facility] [place] hope is that these requests will allow you access to exceptional care and in a timely manner. _______________________________________________________________  If you have questions after your visit, please contact our office at [phone] between the hours of 8:30 a.m. and 5:00 p.m. Voicemails left after 4:30 p.m. will not be returned until the following business day. _______________________________________________________________  For prescription refill requests, have your pharmacy contact our office. _______________________________________________________________  Recommendations made by the consultant and any test results will be sent to your referring physician. _______________________________________________________________

## 2016-12-15 NOTE — Telephone Encounter (Signed)
Message sent to scheduling, referral entered and daughter notified.

## 2016-12-17 DIAGNOSIS — C44629 Squamous cell carcinoma of skin of left upper limb, including shoulder: Secondary | ICD-10-CM | POA: Diagnosis not present

## 2016-12-17 DIAGNOSIS — X32XXXD Exposure to sunlight, subsequent encounter: Secondary | ICD-10-CM | POA: Diagnosis not present

## 2016-12-17 DIAGNOSIS — Z08 Encounter for follow-up examination after completed treatment for malignant neoplasm: Secondary | ICD-10-CM | POA: Diagnosis not present

## 2016-12-17 DIAGNOSIS — L570 Actinic keratosis: Secondary | ICD-10-CM | POA: Diagnosis not present

## 2016-12-17 DIAGNOSIS — L820 Inflamed seborrheic keratosis: Secondary | ICD-10-CM | POA: Diagnosis not present

## 2016-12-17 DIAGNOSIS — D225 Melanocytic nevi of trunk: Secondary | ICD-10-CM | POA: Diagnosis not present

## 2016-12-17 DIAGNOSIS — Z85828 Personal history of other malignant neoplasm of skin: Secondary | ICD-10-CM | POA: Diagnosis not present

## 2016-12-24 DIAGNOSIS — S61401S Unspecified open wound of right hand, sequela: Secondary | ICD-10-CM | POA: Diagnosis not present

## 2016-12-24 DIAGNOSIS — E1165 Type 2 diabetes mellitus with hyperglycemia: Secondary | ICD-10-CM | POA: Diagnosis not present

## 2016-12-29 ENCOUNTER — Encounter: Payer: Self-pay | Admitting: Genetic Counselor

## 2016-12-31 ENCOUNTER — Encounter (HOSPITAL_COMMUNITY): Payer: Medicare Other | Admitting: Genetic Counselor

## 2016-12-31 ENCOUNTER — Other Ambulatory Visit (HOSPITAL_COMMUNITY): Payer: Medicare Other

## 2017-01-04 ENCOUNTER — Ambulatory Visit (INDEPENDENT_AMBULATORY_CARE_PROVIDER_SITE_OTHER): Payer: Medicare Other | Admitting: *Deleted

## 2017-01-04 DIAGNOSIS — I480 Paroxysmal atrial fibrillation: Secondary | ICD-10-CM | POA: Diagnosis not present

## 2017-01-04 DIAGNOSIS — Z5181 Encounter for therapeutic drug level monitoring: Secondary | ICD-10-CM | POA: Diagnosis not present

## 2017-01-04 DIAGNOSIS — I4891 Unspecified atrial fibrillation: Secondary | ICD-10-CM

## 2017-01-04 LAB — POCT INR: INR: 2.3

## 2017-01-11 ENCOUNTER — Other Ambulatory Visit (HOSPITAL_COMMUNITY): Payer: Self-pay | Admitting: *Deleted

## 2017-01-11 DIAGNOSIS — C7951 Secondary malignant neoplasm of bone: Secondary | ICD-10-CM

## 2017-01-11 DIAGNOSIS — C61 Malignant neoplasm of prostate: Secondary | ICD-10-CM

## 2017-01-11 DIAGNOSIS — C8590 Non-Hodgkin lymphoma, unspecified, unspecified site: Secondary | ICD-10-CM

## 2017-01-12 ENCOUNTER — Encounter (HOSPITAL_COMMUNITY): Payer: Medicare Other | Attending: Oncology

## 2017-01-12 ENCOUNTER — Encounter (HOSPITAL_COMMUNITY): Payer: Self-pay

## 2017-01-12 VITALS — BP 138/77 | HR 86 | Temp 97.9°F | Resp 18

## 2017-01-12 DIAGNOSIS — C8590 Non-Hodgkin lymphoma, unspecified, unspecified site: Secondary | ICD-10-CM | POA: Diagnosis not present

## 2017-01-12 DIAGNOSIS — C61 Malignant neoplasm of prostate: Secondary | ICD-10-CM | POA: Diagnosis not present

## 2017-01-12 DIAGNOSIS — B351 Tinea unguium: Secondary | ICD-10-CM | POA: Diagnosis not present

## 2017-01-12 DIAGNOSIS — C7951 Secondary malignant neoplasm of bone: Secondary | ICD-10-CM | POA: Diagnosis present

## 2017-01-12 DIAGNOSIS — E1142 Type 2 diabetes mellitus with diabetic polyneuropathy: Secondary | ICD-10-CM | POA: Diagnosis not present

## 2017-01-12 LAB — CBC WITH DIFFERENTIAL/PLATELET
Basophils Absolute: 0 10*3/uL (ref 0.0–0.1)
Basophils Relative: 0 %
Eosinophils Absolute: 0 10*3/uL (ref 0.0–0.7)
Eosinophils Relative: 0 %
HCT: 39.5 % (ref 39.0–52.0)
Hemoglobin: 14.5 g/dL (ref 13.0–17.0)
Lymphocytes Relative: 29 %
Lymphs Abs: 1.8 10*3/uL (ref 0.7–4.0)
MCH: 33.1 pg (ref 26.0–34.0)
MCHC: 36.7 g/dL — ABNORMAL HIGH (ref 30.0–36.0)
MCV: 90.2 fL (ref 78.0–100.0)
Monocytes Absolute: 0.7 10*3/uL (ref 0.1–1.0)
Monocytes Relative: 11 %
Neutro Abs: 3.8 10*3/uL (ref 1.7–7.7)
Neutrophils Relative %: 60 %
Platelets: 169 10*3/uL (ref 150–400)
RBC: 4.38 MIL/uL (ref 4.22–5.81)
RDW: 13.1 % (ref 11.5–15.5)
WBC: 6.3 10*3/uL (ref 4.0–10.5)

## 2017-01-12 LAB — COMPREHENSIVE METABOLIC PANEL
ALT: 16 U/L — ABNORMAL LOW (ref 17–63)
AST: 17 U/L (ref 15–41)
Albumin: 3.6 g/dL (ref 3.5–5.0)
Alkaline Phosphatase: 74 U/L (ref 38–126)
Anion gap: 10 (ref 5–15)
BUN: 15 mg/dL (ref 6–20)
CO2: 29 mmol/L (ref 22–32)
Calcium: 9.6 mg/dL (ref 8.9–10.3)
Chloride: 96 mmol/L — ABNORMAL LOW (ref 101–111)
Creatinine, Ser: 0.81 mg/dL (ref 0.61–1.24)
GFR calc Af Amer: >60 mL/min (ref >60)
GFR calc non Af Amer: >60 mL/min (ref >60)
Glucose, Bld: 279 mg/dL — ABNORMAL HIGH (ref 65–99)
Potassium: 3.3 mmol/L — ABNORMAL LOW (ref 3.5–5.1)
Sodium: 135 mmol/L (ref 135–145)
Total Bilirubin: 1 mg/dL (ref 0.3–1.2)
Total Protein: 6.4 g/dL — ABNORMAL LOW (ref 6.5–8.1)

## 2017-01-12 LAB — LACTATE DEHYDROGENASE: LDH: 134 U/L (ref 98–192)

## 2017-01-12 LAB — PSA: Prostatic Specific Antigen: 0.01 ng/mL (ref 0.00–4.00)

## 2017-01-12 MED ORDER — DENOSUMAB 120 MG/1.7ML ~~LOC~~ SOLN
120.0000 mg | Freq: Once | SUBCUTANEOUS | Status: AC
  Administered 2017-01-12: 120 mg via SUBCUTANEOUS
  Filled 2017-01-12: qty 1.7

## 2017-01-12 NOTE — Progress Notes (Signed)
[name] presents today for injection per the provider's orders.  Xgeva administration without incident; see MAR for injection details.  Patient tolerated procedure well and without incident.  No questions or complaints noted at this time.  Discharged ambulatory.

## 2017-01-14 DIAGNOSIS — L570 Actinic keratosis: Secondary | ICD-10-CM | POA: Diagnosis not present

## 2017-01-14 DIAGNOSIS — Z08 Encounter for follow-up examination after completed treatment for malignant neoplasm: Secondary | ICD-10-CM | POA: Diagnosis not present

## 2017-01-14 DIAGNOSIS — L821 Other seborrheic keratosis: Secondary | ICD-10-CM | POA: Diagnosis not present

## 2017-01-14 DIAGNOSIS — X32XXXD Exposure to sunlight, subsequent encounter: Secondary | ICD-10-CM | POA: Diagnosis not present

## 2017-01-14 DIAGNOSIS — Z85828 Personal history of other malignant neoplasm of skin: Secondary | ICD-10-CM | POA: Diagnosis not present

## 2017-01-18 ENCOUNTER — Other Ambulatory Visit (HOSPITAL_COMMUNITY): Payer: Self-pay | Admitting: Emergency Medicine

## 2017-01-18 MED ORDER — POTASSIUM CHLORIDE CRYS ER 20 MEQ PO TBCR: 20.0000 meq | EXTENDED_RELEASE_TABLET | Freq: Every day | ORAL | 1 refills | Status: DC

## 2017-01-25 ENCOUNTER — Other Ambulatory Visit (HOSPITAL_COMMUNITY): Payer: Self-pay | Admitting: Oncology

## 2017-01-25 DIAGNOSIS — C61 Malignant neoplasm of prostate: Secondary | ICD-10-CM

## 2017-01-27 DIAGNOSIS — R300 Dysuria: Secondary | ICD-10-CM | POA: Diagnosis not present

## 2017-01-27 DIAGNOSIS — N390 Urinary tract infection, site not specified: Secondary | ICD-10-CM | POA: Diagnosis not present

## 2017-01-28 ENCOUNTER — Encounter (HOSPITAL_COMMUNITY): Payer: Self-pay | Admitting: Genetic Counselor

## 2017-01-28 ENCOUNTER — Encounter (HOSPITAL_COMMUNITY): Payer: Medicare Other | Attending: Oncology

## 2017-01-28 ENCOUNTER — Encounter (HOSPITAL_BASED_OUTPATIENT_CLINIC_OR_DEPARTMENT_OTHER): Payer: Medicare Other | Admitting: Genetic Counselor

## 2017-01-28 DIAGNOSIS — Z803 Family history of malignant neoplasm of breast: Secondary | ICD-10-CM

## 2017-01-28 DIAGNOSIS — C49A0 Gastrointestinal stromal tumor, unspecified site: Secondary | ICD-10-CM | POA: Insufficient documentation

## 2017-01-28 DIAGNOSIS — C61 Malignant neoplasm of prostate: Secondary | ICD-10-CM | POA: Insufficient documentation

## 2017-01-28 DIAGNOSIS — C7951 Secondary malignant neoplasm of bone: Secondary | ICD-10-CM | POA: Insufficient documentation

## 2017-01-28 DIAGNOSIS — C8590 Non-Hodgkin lymphoma, unspecified, unspecified site: Secondary | ICD-10-CM

## 2017-01-28 DIAGNOSIS — E119 Type 2 diabetes mellitus without complications: Secondary | ICD-10-CM | POA: Insufficient documentation

## 2017-01-28 DIAGNOSIS — I10 Essential (primary) hypertension: Secondary | ICD-10-CM | POA: Insufficient documentation

## 2017-01-28 DIAGNOSIS — Z8500 Personal history of malignant neoplasm of unspecified digestive organ: Secondary | ICD-10-CM | POA: Insufficient documentation

## 2017-01-28 DIAGNOSIS — Z7901 Long term (current) use of anticoagulants: Secondary | ICD-10-CM | POA: Insufficient documentation

## 2017-01-28 DIAGNOSIS — C494 Malignant neoplasm of connective and soft tissue of abdomen: Secondary | ICD-10-CM | POA: Diagnosis not present

## 2017-01-28 DIAGNOSIS — Z800 Family history of malignant neoplasm of digestive organs: Secondary | ICD-10-CM | POA: Insufficient documentation

## 2017-01-28 DIAGNOSIS — Z7183 Encounter for nonprocreative genetic counseling: Secondary | ICD-10-CM

## 2017-01-28 DIAGNOSIS — I4891 Unspecified atrial fibrillation: Secondary | ICD-10-CM | POA: Insufficient documentation

## 2017-01-28 DIAGNOSIS — Z7984 Long term (current) use of oral hypoglycemic drugs: Secondary | ICD-10-CM | POA: Insufficient documentation

## 2017-01-28 DIAGNOSIS — C8330 Diffuse large B-cell lymphoma, unspecified site: Secondary | ICD-10-CM | POA: Insufficient documentation

## 2017-01-28 DIAGNOSIS — Z79899 Other long term (current) drug therapy: Secondary | ICD-10-CM | POA: Insufficient documentation

## 2017-01-28 NOTE — Progress Notes (Addendum)
REFERRING PROVIDER: Rosita Fire, MD [place], [place] [address]  PRIMARY PROVIDER:  Rosita Fire, MD  PRIMARY REASON FOR VISIT:  1. Prostate cancer ([place])   2. Family history of breast cancer   3. Family history of colon cancer   4. Non-Hodgkin's lymphoma, unspecified body region, unspecified non-Hodgkin lymphoma type ([place])   5. Malignant gastrointestinal stromal tumor, unspecified site [facility])     HISTORY OF PRESENT ILLNESS:   [name], a 83 y.o. male, was seen for a [place] cancer genetics consultation at the request of Dr. Legrand Rams due to a personal and family history of cancer.  [name] presents to clinic today to discuss the possibility of a hereditary predisposition to cancer, genetic testing, and to further clarify his future cancer risks, as well as potential cancer risks for family members.   In 2004 , at the age of 78, [name] was diagnosed with Non-Hodgkin's lymphoma of the right neck. In 2005, at the age of 79, [name] was diagnosed with a GIST tumor.  In 2015, at the age of 82, [name] was diagnosed with prostate cancer.  The prostate cancer was treated with a prostatectomy.  He has had multiple BCC's removed from his face, head, back and upper torso.     CANCER HISTORY:   No history exists.     RISK FACTORS:  Prostate screening:  Last PSA=0 Dermatology - annual or sooner Colonoscopy: yes; normal. Any excessive radiation exposure in the past:  no  Past Medical History:  Diagnosis Date  . Atrial fibrillation ([place])    Dr. Harl Bowie- [facility] follows saw 11'14  . Bladder stones    tx. with oral meds and antibiotics, now surgery planned  . Cataracts, both eyes    surgery planned May 2015  . Diabetes mellitus without complication ([place])   . Family history of breast cancer   . Family history of colon cancer   . 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 Margot Chimes, thus far without recurrence.   . Hypertension   . NHL (non-Hodgkin's lymphoma) ([place]) 08/18/2011   Diffuse large B-cell lymphoma, clinically stage IIIA, CD20 positive, status post cervical lymph node biopsy 08/03/2003 on the left. PET scan was also positive in the spleen and small bowel region, but bone marrow aspiration and biopsy were negative. So he essentially had stage IIIAs. He received R-CHOP x6 cycles with CR established by PET scan criteria on 11/23/2003 with no evidence for relapse th    Past Surgical History:  Procedure Laterality Date  . CATARACT EXTRACTION Right 08/2013  . CHOLECYSTECTOMY    . CYSTOSCOPY WITH LITHOLAPAXY N/A 08/10/2013   Procedure: CYSTOSCOPY WITH LITHOLAPAXY WITH [name];  Surgeon: [name], MD;  Location: [facility];  Service: Urology;  Laterality: N/A;  . ESOPHAGEAL DILATION N/A 02/27/2016   Procedure: ESOPHAGEAL DILATION;  Surgeon: [name], MD;  Location[facility];  Service: Endoscopy;  Laterality: N/A;  . ESOPHAGOGASTRODUODENOSCOPY N/A 02/27/2016   Procedure: ESOPHAGOGASTRODUODENOSCOPY (EGD);  Surgeon: [name], MD;  Location[facility];  Service: Endoscopy;  Laterality: N/A;  12:00  . INCISION AND DRAINAGE PERIRECTAL ABSCESS    . LYMPH NODE DISSECTION Left    '05  . PORT-A-CATH REMOVAL    . PORTACATH PLACEMENT  insertion and removal -last chemotherapy 10 yrs ago  . TRANSURETHRAL RESECTION OF PROSTATE N/A 08/10/2013   Procedure: TRANSURETHRAL RESECTION OF THE PROSTATE WITH GYRUS INSTRUMENTS;  Surgeon: [name], MD;  Location: [facility];  Service: Urology;  Laterality: N/A;    Social History   Social History  . Marital status: Married    Spouse name: N/A  . Number of children: N/A  . Years of education: N/A   Occupational History  . retired Retired     Hotel manager   Social History Main Topics  . Smoking status: Never Smoker  . Smokeless tobacco: Never Used  . Alcohol use No  . Drug use: No  . Sexual activity: Not Currently   Other Topics Concern  . Not on file   Social History Narrative  . No narrative on file     FAMILY HISTORY:  We obtained a detailed, 4-generation family history.  Significant diagnoses are listed below: Family History  Problem Relation Age of Onset  . Heart attack Mother        died in her 55s  . Other Father 18       pneumonia - died  . Breast cancer Sister        dx in her 39s-60s  . Colon cancer Brother        dx in his 25s  . Cancer Sister        TAH/BSO due to "male cancer"  . Breast cancer Daughter 42  . Breast cancer Daughter 63       DCIS - ATM VUS on [employer] 83 gene panel in 2018  . Breast cancer Daughter 106       DCIS - Negative on Myriad MyRisk 25 gene apnel in 2015  . Thyroid cancer Daughter 10       Medullary and Papillary  . Thyroid nodules Grandchild     The patient has four children, three daughters and one son.  Each daughter has had breast cancer - one at 51, one at 51, one at 58.  One daughter was also diagnosed with medullary and papillary thyroid cancer.  This daughter underwent genetic testing with a larger cancer panel and RET, all which was negative.  He has one brother and two sisters.  His brother was diagnosed with colon cancer, one sister was diagnosed with breast cancer and the other sister was diagnosed with "male" cancer.  Both parents are deceased.  The patient's mother died of a heart attack at age 38.  She had many brothers and sisters, but there is no information about this family.  The patient's father died of pneumonia in his 54's.  He had many siblings as well, but there is no information about this family.  Patient's maternal ancestors are of Caucasian and [place] descent, and paternal ancestors are of Caucasian descent. There is no reported Ashkenazi  Jewish ancestry. There is no known consanguinity.  GENETIC COUNSELING ASSESSMENT: [name] is a 83 y.o. male with a personal and family history of cancer which is somewhat suggestive of a hereditary cancer syndrome and predisposition to cancer. We, therefore, discussed and recommended the following at today's visit.   DISCUSSION: We discussed that about 5-10% of breast cancer is hereditary with most cases due to BRCA mutations. These genes are also associated with high grade prostate cancer. Other hereditary cancer syndromes are due to ATM, CHEK2 and PALB2 mutations.  One of the patient's daughters was recently tested with a larger cancer panel due to  her recent diagnosis of breast cancer.  She came back with a VUS in ATM, but otherwise had negative genetic testing.  There are several cancers in the family which can suggest a common, as well as more rare hereditary cancer syndrome.  The patient's GIST tumor is suggestive of a possible paraganglioma/pheochromocytoma syndromes, such as Carney-Stratakas syndrome.  The Medullary thyroid cancer in his daughter is suggestive of Multiple endocrine neoplasia.  We reviewed the characteristics, features and inheritance patterns of hereditary cancer syndromes. We also discussed genetic testing, including the appropriate family members to test, the process of testing, insurance coverage and turn-around-time for results. We discussed the implications of a negative, positive and/or variant of uncertain significant result. We recommended [name] pursue genetic testing for the [facility] panel. The Multi-Gene Panel offered by [facility] includes sequencing and/or deletion duplication testing of the following 80 genes: ALK, APC, ATM, AXIN2,BAP1,  BARD1, BLM, BMPR1A, BRCA1, BRCA2, BRIP1, CASR, CDC73, CDH1, CDK4, CDKN1B, CDKN1C, CDKN2A (p14ARF), CDKN2A (p16INK4a), CEBPA, CHEK2, CTNNA1, DICER1, DIS3L2, EGFR (c.2369C>T, p.Thr790Met variant only), EPCAM (Deletion/duplication  testing only), FH, FLCN, GATA2, GPC3, GREM1 (Promoter region deletion/duplication testing only), HOXB13 (c.251G>A, p.Gly84Glu), HRAS, KIT, MAX, MEN1, MET, MITF (c.952G>A, p.Glu318Lys variant only), MLH1, MSH2, MSH3, MSH6, MUTYH, NBN, NF1, NF2, NTHL1, PALB2, PDGFRA, PHOX2B, PMS2, POLD1, POLE, POT1, PRKAR1A, PTCH1, PTEN, RAD50, RAD51C, RAD51D, RB1, RECQL4, RET, RUNX1, SDHAF2, SDHA (sequence changes only), SDHB, SDHC, SDHD, SMAD4, SMARCA4, SMARCB1, SMARCE1, STK11, SUFU, TERT, TERT, TMEM127, TP53, TSC1, TSC2, VHL, WRN and WT1.    Based on [name] personal and family history of cancer, he meets medical criteria for genetic testing. Despite that he meets criteria, he may still have an out of pocket cost. We discussed that if his out of pocket cost for testing is over $100, the laboratory will call and confirm whether he wants to proceed with testing.  If the out of pocket cost of testing is less than $100 he will be billed by the genetic testing laboratory.   PLAN: After considering the risks, benefits, and limitations, [name]  provided informed consent to pursue genetic testing and the blood sample was sent to [facility] for analysis of the Multi-gene cancer panel. Results should be available within approximately 2-3 weeks' time, at which point they will be disclosed by telephone to [name], as will any additional recommendations warranted by these results. [name] will receive a summary of his genetic counseling visit and a copy of his results once available. This information will also be available in Epic. We encouraged [name] to remain in contact with cancer genetics annually so that we can continuously update the family history and inform him of any changes in cancer genetics and testing that may be of benefit for his family. [name] questions were answered to his satisfaction today. Our contact information was provided should additional questions or concerns arise.  Lastly, we  encouraged [name] to remain in contact with cancer genetics annually so that we can continuously update the family history and inform him of any changes in cancer genetics and testing that may be of benefit for this family.   Mr.  Foster questions were answered to his satisfaction today. Our contact information was provided should additional questions or concerns arise. Thank you for the referral and allowing [place] to share in the care of your patient.   Leslee Suire P. Florene Glen, [place], [facility] Certified Genetic Counselor [email or web address] phone: [phone]  The patient was seen for a total of 60 minutes  in face-to-face genetic counseling.  This patient was discussed with Drs. Magrinat, Lindi Adie and/or Burr Medico who agrees with the above.    _______________________________________________________________________ For Office Staff:  Number of people involved in session: 3 Was an Intern/ student involved with case: no

## 2017-02-05 DIAGNOSIS — M069 Rheumatoid arthritis, unspecified: Secondary | ICD-10-CM | POA: Diagnosis not present

## 2017-02-05 DIAGNOSIS — N390 Urinary tract infection, site not specified: Secondary | ICD-10-CM | POA: Diagnosis not present

## 2017-02-05 DIAGNOSIS — D649 Anemia, unspecified: Secondary | ICD-10-CM | POA: Diagnosis not present

## 2017-02-05 DIAGNOSIS — I10 Essential (primary) hypertension: Secondary | ICD-10-CM | POA: Diagnosis not present

## 2017-02-05 DIAGNOSIS — C8580 Other specified types of non-Hodgkin lymphoma, unspecified site: Secondary | ICD-10-CM | POA: Diagnosis not present

## 2017-02-05 DIAGNOSIS — Z23 Encounter for immunization: Secondary | ICD-10-CM | POA: Diagnosis not present

## 2017-02-05 DIAGNOSIS — M549 Dorsalgia, unspecified: Secondary | ICD-10-CM | POA: Diagnosis not present

## 2017-02-05 DIAGNOSIS — E1165 Type 2 diabetes mellitus with hyperglycemia: Secondary | ICD-10-CM | POA: Diagnosis not present

## 2017-02-05 DIAGNOSIS — I482 Chronic atrial fibrillation: Secondary | ICD-10-CM | POA: Diagnosis not present

## 2017-02-05 DIAGNOSIS — Z1389 Encounter for screening for other disorder: Secondary | ICD-10-CM | POA: Diagnosis not present

## 2017-02-05 DIAGNOSIS — I2510 Atherosclerotic heart disease of native coronary artery without angina pectoris: Secondary | ICD-10-CM | POA: Diagnosis not present

## 2017-02-05 DIAGNOSIS — E119 Type 2 diabetes mellitus without complications: Secondary | ICD-10-CM | POA: Diagnosis not present

## 2017-02-09 ENCOUNTER — Encounter (HOSPITAL_BASED_OUTPATIENT_CLINIC_OR_DEPARTMENT_OTHER): Payer: Medicare Other

## 2017-02-09 ENCOUNTER — Encounter (HOSPITAL_COMMUNITY): Payer: Medicare Other

## 2017-02-09 ENCOUNTER — Encounter (HOSPITAL_COMMUNITY): Payer: Self-pay | Admitting: Adult Health

## 2017-02-09 ENCOUNTER — Ambulatory Visit: Payer: Self-pay | Admitting: Genetic Counselor

## 2017-02-09 ENCOUNTER — Encounter: Payer: Self-pay | Admitting: Genetic Counselor

## 2017-02-09 ENCOUNTER — Encounter (HOSPITAL_BASED_OUTPATIENT_CLINIC_OR_DEPARTMENT_OTHER): Payer: Medicare Other | Admitting: Adult Health

## 2017-02-09 ENCOUNTER — Telehealth: Payer: Self-pay | Admitting: Genetic Counselor

## 2017-02-09 VITALS — BP 135/92 | HR 75 | Temp 97.9°F | Resp 18 | Wt 171.9 lb

## 2017-02-09 DIAGNOSIS — Z1379 Encounter for other screening for genetic and chromosomal anomalies: Secondary | ICD-10-CM

## 2017-02-09 DIAGNOSIS — C49A0 Gastrointestinal stromal tumor, unspecified site: Secondary | ICD-10-CM | POA: Diagnosis not present

## 2017-02-09 DIAGNOSIS — C61 Malignant neoplasm of prostate: Secondary | ICD-10-CM

## 2017-02-09 DIAGNOSIS — R42 Dizziness and giddiness: Secondary | ICD-10-CM

## 2017-02-09 DIAGNOSIS — Z79899 Other long term (current) drug therapy: Secondary | ICD-10-CM | POA: Diagnosis not present

## 2017-02-09 DIAGNOSIS — I4891 Unspecified atrial fibrillation: Secondary | ICD-10-CM | POA: Diagnosis not present

## 2017-02-09 DIAGNOSIS — C8330 Diffuse large B-cell lymphoma, unspecified site: Secondary | ICD-10-CM | POA: Diagnosis not present

## 2017-02-09 DIAGNOSIS — Z803 Family history of malignant neoplasm of breast: Secondary | ICD-10-CM

## 2017-02-09 DIAGNOSIS — E119 Type 2 diabetes mellitus without complications: Secondary | ICD-10-CM | POA: Diagnosis not present

## 2017-02-09 DIAGNOSIS — Z8500 Personal history of malignant neoplasm of unspecified digestive organ: Secondary | ICD-10-CM | POA: Diagnosis not present

## 2017-02-09 DIAGNOSIS — Z7901 Long term (current) use of anticoagulants: Secondary | ICD-10-CM | POA: Diagnosis not present

## 2017-02-09 DIAGNOSIS — C7951 Secondary malignant neoplasm of bone: Secondary | ICD-10-CM | POA: Diagnosis not present

## 2017-02-09 DIAGNOSIS — Z800 Family history of malignant neoplasm of digestive organs: Secondary | ICD-10-CM

## 2017-02-09 DIAGNOSIS — C8590 Non-Hodgkin lymphoma, unspecified, unspecified site: Secondary | ICD-10-CM

## 2017-02-09 DIAGNOSIS — Z7984 Long term (current) use of oral hypoglycemic drugs: Secondary | ICD-10-CM | POA: Diagnosis not present

## 2017-02-09 DIAGNOSIS — I10 Essential (primary) hypertension: Secondary | ICD-10-CM | POA: Diagnosis not present

## 2017-02-09 HISTORY — DX: Encounter for other screening for genetic and chromosomal anomalies: Z13.79

## 2017-02-09 LAB — CBC WITH DIFFERENTIAL/PLATELET
Basophils Absolute: 0 10*3/uL (ref 0.0–0.1)
Basophils Relative: 0 %
Eosinophils Absolute: 0 10*3/uL (ref 0.0–0.7)
Eosinophils Relative: 1 %
HCT: 41.4 % (ref 39.0–52.0)
Hemoglobin: 14.5 g/dL (ref 13.0–17.0)
Lymphocytes Relative: 25 %
Lymphs Abs: 2.1 10*3/uL (ref 0.7–4.0)
MCH: 32.5 pg (ref 26.0–34.0)
MCHC: 35 g/dL (ref 30.0–36.0)
MCV: 92.8 fL (ref 78.0–100.0)
Monocytes Absolute: 1 10*3/uL (ref 0.1–1.0)
Monocytes Relative: 12 %
Neutro Abs: 5.3 10*3/uL (ref 1.7–7.7)
Neutrophils Relative %: 62 %
Platelets: 175 10*3/uL (ref 150–400)
RBC: 4.46 MIL/uL (ref 4.22–5.81)
RDW: 13.1 % (ref 11.5–15.5)
WBC: 8.5 10*3/uL (ref 4.0–10.5)

## 2017-02-09 LAB — COMPREHENSIVE METABOLIC PANEL
ALT: 19 U/L (ref 17–63)
AST: 18 U/L (ref 15–41)
Albumin: 3.8 g/dL (ref 3.5–5.0)
Alkaline Phosphatase: 83 U/L (ref 38–126)
Anion gap: 10 (ref 5–15)
BUN: 17 mg/dL (ref 6–20)
CO2: 27 mmol/L (ref 22–32)
Calcium: 9.8 mg/dL (ref 8.9–10.3)
Chloride: 97 mmol/L — ABNORMAL LOW (ref 101–111)
Creatinine, Ser: 0.77 mg/dL (ref 0.61–1.24)
GFR calc Af Amer: >60 mL/min (ref >60)
GFR calc non Af Amer: >60 mL/min (ref >60)
Glucose, Bld: 256 mg/dL — ABNORMAL HIGH (ref 65–99)
Potassium: 4.2 mmol/L (ref 3.5–5.1)
Sodium: 134 mmol/L — ABNORMAL LOW (ref 135–145)
Total Bilirubin: 0.8 mg/dL (ref 0.3–1.2)
Total Protein: 6.6 g/dL (ref 6.5–8.1)

## 2017-02-09 LAB — PSA: Prostatic Specific Antigen: 0.01 ng/mL (ref 0.00–4.00)

## 2017-02-09 MED ORDER — DENOSUMAB 120 MG/1.7ML ~~LOC~~ SOLN
120.0000 mg | Freq: Once | SUBCUTANEOUS | Status: AC
  Administered 2017-02-09: 120 mg via SUBCUTANEOUS
  Filled 2017-02-09: qty 1.7

## 2017-02-09 NOTE — Progress Notes (Signed)
HPI: [name] was previously seen in the [name] clinic due to a personal and family history of cancer and concerns regarding a hereditary predisposition to cancer. Please refer to our prior cancer genetics clinic note for more information regarding [name] medical, social and family histories, and our assessment and recommendations, at the time. [name] recent genetic test results were disclosed to him, as were recommendations warranted by these results. These results and recommendations are discussed in more detail below.  CANCER HISTORY:    GIST (gastrointestinal stromal tumor), malignant ([place])   08/18/2011 Initial Diagnosis    GIST (gastrointestinal stromal tumor), malignant ([place])     02/08/2017 Genetic Testing    ATM Gain (Exons 62-63) VUS identified on the multi-gene panel.  The Multi-Gene Panel offered by [facility] includes sequencing and/or deletion duplication testing of the following 80 genes: ALK, APC, ATM, AXIN2,BAP1,  BARD1, BLM, BMPR1A, BRCA1, BRCA2, BRIP1, CASR, CDC73, CDH1, CDK4, CDKN1B, CDKN1C, CDKN2A (p14ARF), CDKN2A (p16INK4a), CEBPA, CHEK2, CTNNA1, DICER1, DIS3L2, EGFR (c.2369C>T, p.Thr790Met variant only), EPCAM (Deletion/duplication testing only), FH, FLCN, GATA2, GPC3, GREM1 (Promoter region deletion/duplication testing only), HOXB13 (c.251G>A, p.Gly84Glu), HRAS, KIT, MAX, MEN1, MET, MITF (c.952G>A, p.Glu318Lys variant only), MLH1, MSH2, MSH3, MSH6, MUTYH, NBN, NF1, NF2, NTHL1, PALB2, PDGFRA, PHOX2B, PMS2, POLD1, POLE, POT1, PRKAR1A, PTCH1, PTEN, RAD50, RAD51C, RAD51D, RB1, RECQL4, RET, RUNX1, SDHAF2, SDHA (sequence changes only), SDHB, SDHC, SDHD, SMAD4, SMARCA4, SMARCB1, SMARCE1, STK11, SUFU, TERT, TERT, TMEM127, TP53, TSC1, TSC2, VHL, WRN and WT1.  The report date is February 08, 2017.        Prostate cancer ([place])   09/08/2013 Initial Diagnosis    Prostate cancer ([place])     02/08/2017 Genetic Testing    ATM Gain (Exons 62-63) VUS identified on  the multi-gene panel.  The Multi-Gene Panel offered by [facility] includes sequencing and/or deletion duplication testing of the following 80 genes: ALK, APC, ATM, AXIN2,BAP1,  BARD1, BLM, BMPR1A, BRCA1, BRCA2, BRIP1, CASR, CDC73, CDH1, CDK4, CDKN1B, CDKN1C, CDKN2A (p14ARF), CDKN2A (p16INK4a), CEBPA, CHEK2, CTNNA1, DICER1, DIS3L2, EGFR (c.2369C>T, p.Thr790Met variant only), EPCAM (Deletion/duplication testing only), FH, FLCN, GATA2, GPC3, GREM1 (Promoter region deletion/duplication testing only), HOXB13 (c.251G>A, p.Gly84Glu), HRAS, KIT, MAX, MEN1, MET, MITF (c.952G>A, p.Glu318Lys variant only), MLH1, MSH2, MSH3, MSH6, MUTYH, NBN, NF1, NF2, NTHL1, PALB2, PDGFRA, PHOX2B, PMS2, POLD1, POLE, POT1, PRKAR1A, PTCH1, PTEN, RAD50, RAD51C, RAD51D, RB1, RECQL4, RET, RUNX1, SDHAF2, SDHA (sequence changes only), SDHB, SDHC, SDHD, SMAD4, SMARCA4, SMARCB1, SMARCE1, STK11, SUFU, TERT, TERT, TMEM127, TP53, TSC1, TSC2, VHL, WRN and WT1.  The report date is February 08, 2017.        FAMILY HISTORY:  We obtained a detailed, 4-generation family history.  Significant diagnoses are listed below: Family History  Problem Relation Age of Onset  . Heart attack Mother        died in her 56s  . Other Father 65       pneumonia - died  . Breast cancer Sister        dx in her 90s-60s  . Colon cancer Brother        dx in his 65s  . Cancer Sister        TAH/BSO due to "male cancer"  . Breast cancer Daughter 40  . Breast cancer Daughter 48       DCIS - ATM VUS on Inv[employer]ae 83 gene panel in 2018  . Breast cancer Daughter 73       DCIS - Negative on Myriad MyRisk 25 gene apnel  in 2015  . Thyroid cancer Daughter 55       Medullary and Papillary  . Thyroid nodules Grandchild     The patient has four children, three daughters and one son.  Each daughter has had breast cancer - one at 22, one at 70, one at 39.  One daughter was also diagnosed with medullary and papillary thyroid cancer.  This daughter underwent genetic testing  with a larger cancer panel and RET, all which was negative.  He has one brother and two sisters.  His brother was diagnosed with colon cancer, one sister was diagnosed with breast cancer and the other sister was diagnosed with "male" cancer.  Both parents are deceased.  The patient's mother died of a heart attack at age 9.  She had many brothers and sisters, but there is no information about this family.  The patient's father died of pneumonia in his 40's.  He had many siblings as well, but there is no information about this family.  Patient's maternal ancestors are of Caucasian and [place] descent, and paternal ancestors are of Caucasian descent. There is no reported Ashkenazi Jewish ancestry. There is no known consanguinity.  GENETIC TEST RESULTS: Genetic testing reported out on February 08, 2017 through the Multi-gene cancer panel found no deleterious mutations.  The Multi-Gene Panel offered by [facility] includes sequencing and/or deletion duplication testing of the following 80 genes: ALK, APC, ATM, AXIN2,BAP1,  BARD1, BLM, BMPR1A, BRCA1, BRCA2, BRIP1, CASR, CDC73, CDH1, CDK4, CDKN1B, CDKN1C, CDKN2A (p14ARF), CDKN2A (p16INK4a), CEBPA, CHEK2, CTNNA1, DICER1, DIS3L2, EGFR (c.2369C>T, p.Thr790Met variant only), EPCAM (Deletion/duplication testing only), FH, FLCN, GATA2, GPC3, GREM1 (Promoter region deletion/duplication testing only), HOXB13 (c.251G>A, p.Gly84Glu), HRAS, KIT, MAX, MEN1, MET, MITF (c.952G>A, p.Glu318Lys variant only), MLH1, MSH2, MSH3, MSH6, MUTYH, NBN, NF1, NF2, NTHL1, PALB2, PDGFRA, PHOX2B, PMS2, POLD1, POLE, POT1, PRKAR1A, PTCH1, PTEN, RAD50, RAD51C, RAD51D, RB1, RECQL4, RET, RUNX1, SDHAF2, SDHA (sequence changes only), SDHB, SDHC, SDHD, SMAD4, SMARCA4, SMARCB1, SMARCE1, STK11, SUFU, TERT, TERT, TMEM127, TP53, TSC1, TSC2, VHL, WRN and WT1.  The test report has been scanned into EPIC and is located under the Molecular Pathology section of the Results Review tab.   We discussed with  [name] that since the current genetic testing is not perfect, it is possible there may be a gene mutation in one of these genes that current testing cannot detect, but that chance is small. We also discussed, that it is possible that another gene that has not yet been discovered, or that we have not yet tested, is responsible for the cancer diagnoses in the family, and it is, therefore, important to remain in touch with cancer genetics in the future so that we can continue to offer [name] the most up to date genetic testing.   Genetic testing did detect a Variant of Unknown Significance in the ATM gene called Gain (Exons 62-63). This variant results in a copy number gain of the genomic region encompassing exons 62-63.  The exact location of this variant in the genome is unknown.  At this time, it is unknown if this variant is associated with increased cancer risk or if this is a normal finding, but most variants such as this get reclassified to being inconsequential. It should not be used to make medical management decisions. With time, we suspect the lab will determine the significance of this variant, if any. If we do learn more about it, we will try to contact [name] to discuss it further. However, it is  important to stay in touch with [place] periodically and keep the address and phone number up to date.   CANCER SCREENING RECOMMENDATIONS:  Given [name] personal and family histories, we must interpret these negative results with some caution.  Families with features suggestive of hereditary risk for cancer tend to have multiple family members with cancer, diagnoses in multiple generations and diagnoses before the age of 36. [name] family exhibits some of these features. Thus this result may simply reflect our current inability to detect all mutations within these genes or there may be a different gene that has not yet been discovered or tested.   RECOMMENDATIONS FOR FAMILY MEMBERS:  Women in this family might be at some increased risk of developing cancer, over the general population risk, simply due to the family history of cancer. We recommended women in this family have a yearly mammogram beginning at age 69, or 29 years younger than the earliest onset of cancer, an annual clinical breast exam, and perform monthly breast self-exams. Women in this family should also have a gynecological exam as recommended by their primary provider. All family members should have a colonoscopy by age 71.  FOLLOW-UP: Lastly, we discussed with [name] that cancer genetics is a rapidly advancing field and it is possible that new genetic tests will be appropriate for him and/or his family members in the future. We encouraged him to remain in contact with cancer genetics on an annual basis so we can update his personal and family histories and let him know of advances in cancer genetics that may benefit this family.   Our contact number was provided. [name] questions were answered to his satisfaction, and he knows he is welcome to call us at anytime with additional questions or concerns.   Roma Kayser, MS, [facility] Certified Genetic Counselor [email or web address]

## 2017-02-09 NOTE — Telephone Encounter (Signed)
Revealed negative genetic testing.  Discussed that we do not know why he has a GIST or prostate cancer or why there is cancer in the family. It could be due to a different gene that we are not testing, or maybe our current technology may not be able to pick something up.  It will be important for him to keep in contact with genetics to keep up with whether additional testing may be needed.

## 2017-02-09 NOTE — Patient Instructions (Signed)
[place] at [facility]  Discharge Instructions:  You received an xgeva shot today.  Keep next scheduled appointment and call for any concerns or questions.  _______________________________________________________________  Thank you for choosing [place] at [facility] to provide your oncology and hematology care.  To afford each patient quality time with our providers, please arrive at least 15 minutes before your scheduled appointment.  You need to re-schedule your appointment if you arrive 10 or more minutes late.  We strive to give you quality time with our providers, and arriving late affects you and other patients whose appointments are after yours.  Also, if you no show three or more times for appointments you may be dismissed from the clinic.  Again, thank you for choosing [facility] [place] hope is that these requests will allow you access to exceptional care and in a timely manner. _______________________________________________________________  If you have questions after your visit, please contact our office at [phone] between the hours of 8:30 a.m. and 5:00 p.m. Voicemails left after 4:30 p.m. will not be returned until the following business day. _______________________________________________________________  For prescription refill requests, have your pharmacy contact our office. _______________________________________________________________  Recommendations made by the consultant and any test results will be sent to your referring physician. _______________________________________________________________

## 2017-02-09 NOTE — Progress Notes (Signed)
Patient tolerated Xgeva with no complaints voiced.  Denied tooth or jaw pain.  No future dental appointments and denied leg pain.  Injection site clean and dry with no bruising or swelling noted at site.  Band aid applied.  VSS with discharge and left ambulatory with wife.  No s/s of distress noted.
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
## 2017-02-09 NOTE — Patient Instructions (Addendum)
[place] at [facility] Discharge Instructions  RECOMMENDATIONS MADE BY THE CONSULTANT AND ANY TEST RESULTS WILL BE SENT TO YOUR REFERRING PHYSICIAN.  You were seen today by Mike Craze, NP We will refer you to neurology in [facility] for your dizziness Follow up in late December before you travel  Continue monthly Xgeva injections until then. See schedulers up front for appointments   Thank you for choosing [place] at [facility] to provide your oncology and hematology care.  To afford each patient quality time with our provider, please arrive at least 15 minutes before your scheduled appointment time.    If you have a lab appointment with the [facility] please come in thru the  Main Entrance and check in at the main information desk  You need to re-schedule your appointment should you arrive 10 or more minutes late.  We strive to give you quality time with our providers, and arriving late affects you and other patients whose appointments are after yours.  Also, if you no show three or more times for appointments you may be dismissed from the clinic at the providers discretion.     Again, thank you for choosing [facility].  Our hope is that these requests will decrease the amount of time that you wait before being seen by our physicians.       _____________________________________________________________  Should you have questions after your visit to [facility], please contact our office at [phone] between the hours of 8:30 a.m. and 4:30 p.m.  Voicemails left after 4:30 p.m. will not be returned until the following business day.  For prescription refill requests, have your pharmacy contact our office.       Resources For Cancer Patients and their Caregivers ? American Cancer Society: Can assist with transportation, wigs, general needs, runs Look Good Feel Better.        (602) 501-1249 ? Cancer  Care: Provides financial assistance, online support groups, medication/co-pay assistance.  1-800-813-HOPE 854-420-6222) ? [place] Assists [place] cancer patients and their families through emotional , educational and financial support.  775-162-1597 ? [place] DSS Where to apply for food stamps, Medicaid and utility assistance. 989-422-0669 ? [facility]: Transportation to medical appointments. 541-405-5381 ? Social Security Administration: May apply for disability if have a Stage IV cancer. 530-420-3382 805-152-8368 ? [employer], Disability and Transit Services: Assists with nutrition, care and transit needs. [place] Support Programs: @10RELATIVEDAYS @ > Cancer Support Group  2nd Tuesday of the month 1pm-2pm, Journey Room  > Creative Journey  3rd Tuesday of the month 1130am-1pm, Journey Room  > Look Good Feel Better  1st Wednesday of the month 10am-12 noon, Journey Room (Call [place] to register [phone])

## 2017-02-15 ENCOUNTER — Ambulatory Visit (INDEPENDENT_AMBULATORY_CARE_PROVIDER_SITE_OTHER): Payer: Medicare Other | Admitting: *Deleted

## 2017-02-15 DIAGNOSIS — I4891 Unspecified atrial fibrillation: Secondary | ICD-10-CM | POA: Diagnosis not present

## 2017-02-15 DIAGNOSIS — Z5181 Encounter for therapeutic drug level monitoring: Secondary | ICD-10-CM | POA: Diagnosis not present

## 2017-02-15 DIAGNOSIS — I480 Paroxysmal atrial fibrillation: Secondary | ICD-10-CM | POA: Diagnosis not present

## 2017-02-15 LAB — POCT INR: INR: 1.8

## 2017-02-19 ENCOUNTER — Encounter: Payer: Self-pay | Admitting: Neurology

## 2017-02-19 ENCOUNTER — Ambulatory Visit (INDEPENDENT_AMBULATORY_CARE_PROVIDER_SITE_OTHER): Payer: Medicare Other | Admitting: Neurology

## 2017-02-19 VITALS — BP 156/77 | HR 65 | Resp 20 | Ht 70.0 in | Wt 176.0 lb

## 2017-02-19 DIAGNOSIS — R350 Frequency of micturition: Secondary | ICD-10-CM | POA: Diagnosis not present

## 2017-02-19 DIAGNOSIS — R42 Dizziness and giddiness: Secondary | ICD-10-CM | POA: Diagnosis not present

## 2017-02-19 DIAGNOSIS — I4891 Unspecified atrial fibrillation: Secondary | ICD-10-CM | POA: Diagnosis not present

## 2017-02-19 DIAGNOSIS — R200 Anesthesia of skin: Secondary | ICD-10-CM | POA: Insufficient documentation

## 2017-02-19 DIAGNOSIS — G629 Polyneuropathy, unspecified: Secondary | ICD-10-CM

## 2017-02-19 DIAGNOSIS — R55 Syncope and collapse: Secondary | ICD-10-CM

## 2017-02-19 DIAGNOSIS — C8590 Non-Hodgkin lymphoma, unspecified, unspecified site: Secondary | ICD-10-CM | POA: Diagnosis not present

## 2017-02-19 HISTORY — DX: Polyneuropathy, unspecified: G62.9

## 2017-02-19 HISTORY — DX: Anesthesia of skin: R20.0

## 2017-02-19 HISTORY — DX: Dizziness and giddiness: R42

## 2017-02-19 HISTORY — DX: Frequency of micturition: R35.0

## 2017-02-19 HISTORY — DX: Syncope and collapse: R55

## 2017-02-19 NOTE — Progress Notes (Signed)
[facility]  PATIENT: [name] DOB: March 30, 1924  REFERRING DOCTOR OR PCP:  [name] SOURCE: patient, notes from Oncology,   _________________________________   HISTORICAL  CHIEF COMPLAINT:  Chief Complaint  Patient presents with  . Dizziness    HISTORY OF PRESENT ILLNESS:  I had the pleasure of seeing your patient, [name] at [facility], for a neurologic consultation regarding his lightheadedness and occasional falls.  He reports that when he stands up, he feels lightheaded and dizzy. This typically will last for about 30 seconds or so.  He thinks he has had a few episodes of syncope associated with falls.   This has occurred about half dozen times with minor injuries.  He very quickly recovers consciousness and is back to his baseline within seconds. There has never been any seizure activity.     Dizziness never lasts more than a minute and he remains active.   He is able to walk > 100 yards without stopping.    He still does yardwork.     He denies arm or proximal leg numbness or weakness.   However, he feels strength has slowly diminished the past few years.     Also, he notes some tingling in his feet. At times the sensation is more like a burning.  He does have diabetes and is on metformin and glipizide  He has atrial fibrillation and is on coumadin.   He sees Dr, [name] in [employer].  He has urinary frequency and some urgency but no incontinence.   He has 3 x nocturia (stable pattern x years).   Of note, he has had prostate cancer and is on Lupron.  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   Received R-CHOP x 6 cycles.    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    REVIEW OF SYSTEMS: Constitutional: No fevers, chills, sweats, or change in appetite.   He sleeps well Eyes: No visual changes, double vision, eye pain Ear, nose  and throat: No hearing loss, ear pain, nasal congestion, sore throat Cardiovascular: He has rate controlled atrial fibrillation.  No chest pain.   Respiratory: No shortness of breath at rest or with exertion.   No wheezes GastrointestinaI: No nausea, vomiting, diarrhea, abdominal pain, fecal incontinence Genitourinary: He has urinary frequency and nocturia. No hesitancy. History of prostate cancer. Musculoskeletal: No neck pain, back pain Integumentary: No rash, pruritus, skin lesions Neurological: as above Psychiatric: No depression at this time.  No anxiety Endocrine: No palpitations, diaphoresis, change in appetite, change in weigh or increased thirst Hematologic/Lymphatic: No anemia, purpura, petechiae. Allergic/Immunologic: No itchy/runny eyes, nasal congestion, recent allergic reactions, rashes  ALLERGIES: No Known Allergies  HOME MEDICATIONS:  Current Outpatient Prescriptions:  .  abiraterone Acetate (ZYTIGA) 250 MG tablet, Take 4 tablets (1,000 mg total) by mouth daily. Take on an empty stomach 1 hour before or 2 hours after a meal, Disp: 120 tablet, Rfl: 6 .  acetaminophen (TYLENOL) 500 MG tablet, Take 500-1,000 mg by mouth daily as needed for mild pain, fever or headache., Disp: , Rfl:  .  amitriptyline (ELAVIL) 25 MG tablet, Take 25 mg by mouth at bedtime., Disp: , Rfl:  .  Calcium Carb-Cholecalciferol 600-800 MG-UNIT TABS, Take 1 capsule by mouth daily. , Disp: , Rfl:  .  Cholecalciferol (VITAMIN D3) 1000 units CAPS, Take 1,000 Units by mouth daily., Disp: , Rfl:  .  clotrimazole-betamethasone (LOTRISONE) cream, Apply 1 application topically daily as needed (fungus). , Disp: , Rfl:  .  CRANBERRY PO, Take 4,200 mg by mouth daily., Disp: , Rfl:  .  denosumab (XGEVA) 120 MG/1.7ML SOLN injection, Inject 120 mg into the skin every 30 (thirty) days. , Disp: , Rfl:  .  diltiazem (CARDIZEM) 90 MG tablet, TAKE ONE TABLET BY MOUTH TWICE DAILY, Disp: 180 tablet, Rfl: 2 .  docusate  sodium (COLACE) 100 MG capsule, Take 100 mg by mouth daily as needed for mild constipation., Disp: , Rfl:  .  glipiZIDE (GLUCOTROL XL) 5 MG 24 hr tablet, Take 5 mg by mouth daily with breakfast. , Disp: , Rfl:  .  Leuprolide Acetate, 6 Month, (LUPRON DEPOT) 45 MG injection, Inject 45 mg into the muscle every 6 (six) months., Disp: , Rfl:  .  lisinopril-hydrochlorothiazide (PRINZIDE,ZESTORETIC) 20-12.5 MG tablet, Take 1 tablet by mouth daily., Disp: 90 tablet, Rfl: 3 .  loperamide (IMODIUM) 2 MG capsule, Take 2 mg by mouth as needed for diarrhea or loose stools., Disp: , Rfl:  .  metFORMIN (GLUCOPHAGE) 850 MG tablet, Take 850 mg by mouth 2 (two) times daily with a meal. , Disp: , Rfl:  .  nystatin (MYCOSTATIN) powder, Apply topically 4 (four) times daily as needed (fungus). , Disp: , Rfl:  .  omeprazole (PRILOSEC) 20 MG capsule, Take 20 mg by mouth daily. , Disp: , Rfl:  .  potassium chloride SA (K-DUR,KLOR-CON) 20 MEQ tablet, Take 1 tablet (20 mEq total) by mouth daily. Please take 2 tablets for next weeks, then daily, Disp: 60 tablet, Rfl: 1 .  predniSONE (DELTASONE) 5 MG tablet, TAKE 1 TABLET BY MOUTH TWICE DAILY WITH MEALS, Disp: 180 tablet, Rfl: 1 .  sennosides-docusate sodium (SENOKOT-S) 8.6-50 MG tablet, Take 1 tablet by mouth daily., Disp: , Rfl:  .  warfarin (COUMADIN) 2.5 MG tablet, Take as directed by [facility]., Disp: 160 tablet, Rfl: 0  PAST MEDICAL HISTORY: Past Medical History:  Diagnosis Date  . Atrial fibrillation ([place])    Dr. Harl Foster- [facility] follows saw 11'14  . Bladder stones    tx. with oral meds and antibiotics, now surgery planned  . Cataracts, both eyes    surgery planned May 2015  . Diabetes mellitus without complication ([place])   . Family history of breast cancer   . Family history of colon cancer   . GIST (gastrointestinal stromal tumor), malignant ([place]) 08/18/2011   Gastrointestinal stromal tumor, that is GIST, small bowel, 4.5 cm, intermediate prognostic grade  found on the PET scan in the small bowel, accounting for that small bowel activity in October 2005 with resection by Dr. Margot Chimes, thus far without recurrence.   . Hypertension   . NHL (non-Hodgkin's lymphoma) ([place]) 08/18/2011   Diffuse large B-cell lymphoma, clinically stage IIIA, CD20 positive, status post cervical lymph node biopsy 08/03/2003 on the left. PET scan was also positive in the spleen and small bowel region, but bone marrow aspiration and biopsy were negative. So he essentially had stage IIIAs. He received R-CHOP x6 cycles with CR established by PET scan criteria on 11/23/2003 with no evidence for relapse th    PAST SURGICAL HISTORY: Past Surgical History:  Procedure Laterality Date  . CATARACT EXTRACTION Right 08/2013  . CHOLECYSTECTOMY    . CYSTOSCOPY WITH LITHOLAPAXY N/A 08/10/2013   Procedure: CYSTOSCOPY WITH LITHOLAPAXY WITH [name];  Surgeon: [name], MD;  Location: [facility];  Service: Urology;  Laterality: N/A;  . ESOPHAGEAL DILATION  N/A 02/27/2016   Procedure: ESOPHAGEAL DILATION;  Surgeon: [name], MD;  Location: [facility];  Service: Endoscopy;  Laterality: N/A;  . ESOPHAGOGASTRODUODENOSCOPY N/A 02/27/2016   Procedure: ESOPHAGOGASTRODUODENOSCOPY (EGD);  Surgeon: [name], MD;  Location: [facility];  Service: Endoscopy;  Laterality: N/A;  12:00  . INCISION AND DRAINAGE PERIRECTAL ABSCESS    . LYMPH NODE DISSECTION Left    '05  . PORT-A-CATH REMOVAL    . PORTACATH PLACEMENT     insertion and removal -last chemotherapy 10 yrs ago  . TRANSURETHRAL RESECTION OF PROSTATE N/A 08/10/2013   Procedure: TRANSURETHRAL RESECTION OF THE PROSTATE WITH GYRUS INSTRUMENTS;  Surgeon: [name], MD;  Location: [facility];  Service: Urology;  Laterality: N/A;    FAMILY HISTORY: Family History  Problem Relation Age of Onset  . Heart attack Mother        died in her 78s  . Other Father 50       pneumonia - died  . Breast cancer Sister        dx in her  84s-60s  . Colon cancer Brother        dx in his 59s  . Cancer Sister        TAH/BSO due to "male cancer"  . Breast cancer Daughter 71  . Breast cancer Daughter 25       DCIS - ATM VUS on [employer] 83 gene panel in 2018  . Breast cancer Daughter 66       DCIS - Negative on [employer] MyRisk 25 gene apnel in 2015  . Thyroid cancer Daughter 57       Medullary and Papillary  . Thyroid nodules Grandchild     SOCIAL HISTORY:  Social History   Social History  . Marital status: Married    Spouse name: N/A  . Number of children: N/A  . Years of education: N/A   Occupational History  . retired Retired    Hotel manager   Social History Main Topics  . Smoking status: Never Smoker  . Smokeless tobacco: Never Used  . Alcohol use No  . Drug use: No  . Sexual activity: Not Currently   Other Topics Concern  . Not on file   Social History Narrative  . No narrative on file     PHYSICAL EXAM  Vitals:   02/19/17 0921  BP: (!) 156/77  Pulse: 65  Resp: 20  Weight: 176 lb (79.8 kg)  Height: _0  (1.778 m)    Orthostatic VS for the past 24 hrs (Last 3 readings):  BP- Lying Pulse- Lying BP- Sitting Pulse- Sitting BP- Standing at 0 minutes Pulse- Standing at 0 minutes BP- Standing at 3 minutes Pulse- Standing at 3 minutes  02/19/17 0922 156/77 65 140/81 95 133/73 83 147/88 73   Body mass index is 25.25 kg/m.   General: The patient is well-developed and well-nourished and in no acute distress   Neck: The neck is supple, no carotid bruits are noted.  The neck is nontender.  Cardiovascular: The heart has an irregular rate/rhythm and a normal S1 and S2. There is a 2/6 SEM.   Lungs are clear to auscultation.  Skin: Extremities are without significant edema.  Musculoskeletal:  Back is nontender  Neurologic Exam  Mental status: The patient is alert and oriented x 3 at the time of the examination. The patient has apparent normal recent and remote memory, with an apparently normal  attention span and concentration ability.  Speech is normal.  Cranial nerves: Extraocular movements are full. Pupils are small but appear equal, round, and reactive to light and accomodation.  Visual fields are full.  Facial symmetry is present. There is good facial sensation to soft touch bilaterally.Facial strength is normal.  Trapezius and sternocleidomastoid strength is normal. No dysarthria is noted.  The tongue is midline, and the patient has symmetric elevation of the soft palate. Reduced hearing bilaterally.   Motor:  Muscle bulk is normal.   Tone is normal. Strength is  5 / 5 in all 4 extremities except 4+/5 ankle extension and 4/5 toe extension.   Sensory: On sensory testing, he had intact touch, temperature and vibration in the arms. There was reduced touch/Temperature sensation below the knees.   Vibration sensation is about 10% at the knees and absent at the ankles and toes.  Coordination: Cerebellar testing reveals good finger-nose-finger and heel-to-shin bilaterally.  Gait and station: Station is normal.   Gait is normal. Tandem gait is normal. Romberg is negative.   Reflexes: Deep tendon reflexes are symmetric and normal bilaterally.   Plantar responses are flexor.    DIAGNOSTIC DATA (LABS, IMAGING, TESTING) - I reviewed patient records, labs, notes, testing and imaging myself where available.  Lab Results  Component Value Date   WBC 8.5 02/09/2017   HGB 14.5 02/09/2017   HCT 41.4 02/09/2017   MCV 92.8 02/09/2017   PLT 175 02/09/2017      Component Value Date/Time   NA 134 (L) 02/09/2017 1056   K 4.2 02/09/2017 1056   CL 97 (L) 02/09/2017 1056   CO2 27 02/09/2017 1056   GLUCOSE 256 (H) 02/09/2017 1056   BUN 17 02/09/2017 1056   CREATININE 0.77 02/09/2017 1056   CREATININE 1.14 07/27/2013 1004   CALCIUM 9.8 02/09/2017 1056   PROT 6.6 02/09/2017 1056   ALBUMIN 3.8 02/09/2017 1056   AST 18 02/09/2017 1056   ALT 19 02/09/2017 1056   ALKPHOS 83 02/09/2017 1056    BILITOT 0.8 02/09/2017 1056   GFRNONAA >60 02/09/2017 1056   GFRNONAA 58 (L) 07/27/2013 1004   GFRAA >60 02/09/2017 1056   GFRAA 66 07/27/2013 1004   Lab Results  Component Value Date   CHOL 145 01/20/2006   HDL 35 01/20/2006   LDLCALC 66 01/20/2006   TRIG 219 01/20/2006       ASSESSMENT AND PLAN  Polyneuropathy - Plan: Multiple Myeloma Panel (SPEP&IFE w/QIG), Vitamin B12, NCV with EMG(electromyography)  Postural dizziness with presyncope  Numbness - Plan: Multiple Myeloma Panel (SPEP&IFE w/QIG), Vitamin B12, NCV with EMG(electromyography)  Urinary frequency  Non-Hodgkin's lymphoma, unspecified body region, unspecified non-Hodgkin lymphoma type (HCC)  Atrial fibrillation, unspecified type ([place])   In summary, Mr. Rodeheaver is a 83 year old man who has the spells of presyncope with occasional episodes of syncope who was found to have mild orthostatic blood pressure changes.    Additionally, he has severe numbness in his legs that is fairly symmetric and likely due to a polyneuropathy seems to be both large fiber and small fiber and small fiber component could be contributing some to the orthostatic hypotension and spells of presyncope. To further evaluate the polyneuropathy, I will check SPEP and immunofixation, sedimentation rate, B12. Additionally we will set up an NCV/EMG to determine if this is demyelinating or axonal.  I think he might benefit from fludrocortisone for the repeated episodes of presyncope. As he has AFib and HTN, I will send a note to his cardiologist and start this medication  if there is no contraindication.    I will see Mr. Gongaware back for the NCV/EMG and further follow-up can be scheduled after that. He should call us sooner if he has new or worsening neurologic symptoms.  Thank you for asking me to see Mr. Offord. Please let me know if I can provide further assistance with him or other patients in the future.   Richard A. Felecia Shelling, MD, [facility] 48/07/7205, 2:18  AM Certified in Neurology, Clinical Neurophysiology, Sleep Medicine, Pain Medicine and Neuroimaging  [facility] [address] [place], [place] [address] [phone]

## 2017-02-24 ENCOUNTER — Other Ambulatory Visit: Payer: Self-pay | Admitting: Neurology

## 2017-02-24 ENCOUNTER — Telehealth: Payer: Self-pay | Admitting: *Deleted

## 2017-02-24 DIAGNOSIS — G63 Polyneuropathy in diseases classified elsewhere: Principal | ICD-10-CM

## 2017-02-24 DIAGNOSIS — D472 Monoclonal gammopathy: Secondary | ICD-10-CM

## 2017-02-24 HISTORY — DX: Monoclonal gammopathy: D47.2

## 2017-02-24 LAB — MULTIPLE MYELOMA PANEL, SERUM
Albumin SerPl Elph-Mcnc: 3.2 g/dL (ref 2.9–4.4)
Albumin/Glob SerPl: 1.3 (ref 0.7–1.7)
Alpha 1: 0.2 g/dL (ref 0.0–0.4)
Alpha2 Glob SerPl Elph-Mcnc: 0.7 g/dL (ref 0.4–1.0)
B-Globulin SerPl Elph-Mcnc: 0.8 g/dL (ref 0.7–1.3)
Gamma Glob SerPl Elph-Mcnc: 0.9 g/dL (ref 0.4–1.8)
Globulin, Total: 2.6 g/dL (ref 2.2–3.9)
IgA/Immunoglobulin A, Serum: 161 mg/dL (ref 61–437)
IgG (Immunoglobin G), Serum: 644 mg/dL — ABNORMAL LOW (ref 700–1600)
IgM (Immunoglobulin M), Srm: 400 mg/dL — ABNORMAL HIGH (ref 15–143)
M Protein SerPl Elph-Mcnc: 0.3 g/dL — ABNORMAL HIGH
Total Protein: 5.8 g/dL — ABNORMAL LOW (ref 6.0–8.5)

## 2017-02-24 LAB — VITAMIN B12: Vitamin B-12: 178 pg/mL — ABNORMAL LOW (ref 232–1245)

## 2017-02-24 NOTE — Telephone Encounter (Signed)
-----   Message from Britt Bottom, MD sent at 02/24/2017  8:45 AM EST ----- Please let him know that the antibody test was abnormal and showed that there was an IgM antibody. B12 was also low.   Both of these could be contributing to his numbness and polyneuropathy  We need to:   1.    B12 1000 g weekly injection 4 weeks then once a month. If he feels he can give himself injections, he can come in to be trained 2.    I entered an anti--MAG blood tests that he should stop by to have (unless it can be added to blood already in lab?) 3.    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

## 2017-02-24 NOTE — Telephone Encounter (Signed)
Spoke with pt. and explained some labs are abnormal; additional lab work is needed.  He will come in tomorrow.  I have explained Vit. B12 level is low and offered B12 inj.  He is agreeable.  Will give inj. when he comes in for labs tomorrow.  Have also explained referral to hematology; he is agreeable with this as well/fim

## 2017-02-25 ENCOUNTER — Telehealth: Payer: Self-pay | Admitting: *Deleted

## 2017-02-25 ENCOUNTER — Other Ambulatory Visit (INDEPENDENT_AMBULATORY_CARE_PROVIDER_SITE_OTHER): Payer: Self-pay

## 2017-02-25 ENCOUNTER — Other Ambulatory Visit: Payer: Self-pay | Admitting: Neurology

## 2017-02-25 ENCOUNTER — Ambulatory Visit (INDEPENDENT_AMBULATORY_CARE_PROVIDER_SITE_OTHER): Payer: Medicare Other | Admitting: *Deleted

## 2017-02-25 DIAGNOSIS — E538 Deficiency of other specified B group vitamins: Secondary | ICD-10-CM | POA: Diagnosis not present

## 2017-02-25 DIAGNOSIS — D472 Monoclonal gammopathy: Secondary | ICD-10-CM

## 2017-02-25 DIAGNOSIS — Z0289 Encounter for other administrative examinations: Secondary | ICD-10-CM

## 2017-02-25 DIAGNOSIS — J029 Acute pharyngitis, unspecified: Secondary | ICD-10-CM | POA: Diagnosis not present

## 2017-02-25 DIAGNOSIS — G63 Polyneuropathy in diseases classified elsewhere: Secondary | ICD-10-CM

## 2017-02-25 MED ORDER — LISINOPRIL 20 MG PO TABS: 20.0000 mg | ORAL_TABLET | Freq: Every day | ORAL | 11 refills | Status: DC

## 2017-02-25 MED ORDER — CYANOCOBALAMIN 1000 MCG/ML IJ SOLN
1000.0000 ug | Freq: Once | INTRAMUSCULAR | Status: AC
  Administered 2017-02-25: 1000 ug via INTRAMUSCULAR

## 2017-02-25 MED ORDER — FLUDROCORTISONE ACETATE 0.1 MG PO TABS: 0.1000 mg | ORAL_TABLET | Freq: Every day | ORAL | 11 refills | Status: DC

## 2017-02-25 NOTE — Telephone Encounter (Signed)
-----   Message from Britt Bottom, MD sent at 02/25/2017 10:33 AM EST ----- Please let him know I communicated with his cardiologist.     I would like to start a new medication fludrocortisone to try to help prevent the lightheadedness when he stands.  We will also change his one BP med from lisinopril/HCTZ to just lisinopril.  I will send in the new orders

## 2017-02-25 NOTE — Telephone Encounter (Signed)
I have spoken with both Mr. and [name] and reviewed below directions for med changes.  Advised that pt. should be sure to stop Lisinopril/HCTZ when he starts plain Lisinopril and Fludrocortisone.  Mrs. Gettis repeated instructions back to me, and they will call if they have any other questions./fim

## 2017-03-02 ENCOUNTER — Ambulatory Visit (HOSPITAL_COMMUNITY): Payer: Medicare Other

## 2017-03-02 ENCOUNTER — Ambulatory Visit (INDEPENDENT_AMBULATORY_CARE_PROVIDER_SITE_OTHER): Payer: Medicare Other | Admitting: Urology

## 2017-03-02 DIAGNOSIS — C7951 Secondary malignant neoplasm of bone: Secondary | ICD-10-CM | POA: Diagnosis not present

## 2017-03-02 DIAGNOSIS — C61 Malignant neoplasm of prostate: Secondary | ICD-10-CM

## 2017-03-02 DIAGNOSIS — R9721 Rising PSA following treatment for malignant neoplasm of prostate: Secondary | ICD-10-CM | POA: Diagnosis not present

## 2017-03-02 NOTE — Progress Notes (Signed)
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 male presents for routine follow-up for several malignancies including metastatic prostate cancer, and history of DLBCL and GIST of small bowel.   Here today with his wife for work-in appointment. His wife called the cancer center and spoke with our triage nurse, [name], recently. She reportedly shared with [name] that [name] has been very antsy since he was recently diagnosed with vitamin B12 deficiency by his neurology; he has been very worried about this diagnosis "because I thought it was something really bad."    He is worried about his blood pressure; he is asymptomatic without c/o chest pain or headaches.  He shares with me that his "heart medicines" were recently adjusted.  I encouraged him to re-check at home and keep log and contact his cardiologist with persistent elevated BP readings.   He tells me that he has started his vitamin B12 injections and then will "start the pills after that."  His wife shares that he has "calmed down quite a bit" since learning of his vitamin B12 deficiency.  Mr. Schappell states, "I was just frustrated because it just seems like something is wrong every time I turn around. 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   He is due for Lupron injection today.  He and his wife are anticipating their  short-term relocation to [place] for the winter months.  He continues on his Zytiga/Prednisone as prescribed. Denies any adverse effects from these medications.   He tells me that his daughter got him a cane, "but I don't want to use it because I'm afraid if I pick it up, I'll never stop needing it!"  He wants to maintain his independence.  He has had some falls in the past (none since our last visit); denies any serious injuries with these falls.    Reports that he gets confused with which medications he should take in the AM and which in the PM.  He has a pill box; unclear if he is using it appropriately.       REVIEW OF SYSTEMS:  Review of Systems  Constitutional: Positive for fatigue. Negative for chills and fever.  HENT:  Negative.   Eyes: Negative.   Respiratory: Negative.   Cardiovascular: Negative.   Gastrointestinal: Negative.   Endocrine: Negative.   Genitourinary: Negative.    Musculoskeletal: Negative.   Skin: Negative.   Neurological: Positive for dizziness (being managed by neurology). Negative for headaches.  Hematological: Bruises/bleeds easily ("because of the Coumadin").  Psychiatric/Behavioral: The patient is nervous/anxious.      PAST MEDICAL/SURGICAL HISTORY:  Past Medical History:  Diagnosis Date  . Atrial fibrillation ([place])    Dr. Harl Bowie- [facility] follows saw 11'14  . Bladder stones    tx. with oral meds and antibiotics, now surgery planned  . Cataracts, both eyes  surgery planned May 2015  . Diabetes mellitus without complication ([place])   . Family history of breast cancer   . Family history of colon cancer   . GIST (gastrointestinal stromal tumor), malignant ([place]) 08/18/2011   Gastrointestinal stromal tumor, that is GIST, small bowel, 4.5 cm, intermediate prognostic grade found on the PET scan in the small bowel, accounting for that small bowel activity in October 2005 with resection by Dr. Margot Chimes, thus far without recurrence.   . Hypertension   .  NHL (non-Hodgkin's lymphoma) ([place]) 08/18/2011   Diffuse large B-cell lymphoma, clinically stage IIIA, CD20 positive, status post cervical lymph node biopsy 08/03/2003 on the left. PET scan was also positive in the spleen and small bowel region, but bone marrow aspiration and biopsy were negative. So he essentially had stage IIIAs. He received R-CHOP x6 cycles with CR established by PET scan criteria on 11/23/2003 with no evidence for relapse th   Past Surgical History:  Procedure Laterality Date  . CATARACT EXTRACTION Right 08/2013  . CHOLECYSTECTOMY    . INCISION AND DRAINAGE PERIRECTAL ABSCESS    . LYMPH NODE DISSECTION Left    '05  . PORT-A-CATH REMOVAL    . PORTACATH PLACEMENT     insertion and removal -last chemotherapy 10 yrs ago     SOCIAL HISTORY:  Social History   Socioeconomic History  . Marital status: Married    Spouse name: Not on file  . Number of children: Not on file  . Years of education: Not on file  . Highest education level: Not on file  Social Needs  . Financial resource strain: Not on file  . Food insecurity - worry: Not on file  . Food insecurity - inability: Not on file  . Transportation needs - medical: Not on file  . Transportation needs - non-medical: Not on file  Occupational History  . Occupation: retired    Fish farm manager: RETIRED    Comment: salesman  Tobacco Use  . Smoking status: Never Smoker  . Smokeless tobacco: Never Used  Substance and Sexual Activity  . Alcohol use: No  . Drug use: No  . Sexual activity: Not Currently  Other Topics Concern  . Not on file  Social History Narrative  . Not on file    FAMILY HISTORY:  Family History  Problem Relation Age of Onset  . Heart attack Mother        died in her 37s  . Other Father 17       pneumonia - died  . Breast cancer Sister        dx in her 62s-60s  . Colon cancer Brother        dx in his 69s  . Cancer Sister        TAH/BSO due to "male cancer"  . Breast cancer Daughter 13  .  Breast cancer Daughter 80       DCIS - ATM VUS on Invitae 83 gene panel in 2018  . Breast cancer Daughter 17       DCIS - Negative on Myriad MyRisk 25 gene apnel in 2015  . Thyroid cancer Daughter 72       Medullary and Papillary  . Thyroid nodules Grandchild     CURRENT MEDICATIONS:  Outpatient Encounter Medications as of 03/03/2017  Medication Sig  . abiraterone Acetate (ZYTIGA) 250 MG tablet Take 4 tablets (1,000 mg total) by mouth daily. Take on an empty stomach 1 hour before or 2 hours after a meal  .  acetaminophen (TYLENOL) 500 MG tablet Take 500-1,000 mg by mouth daily as needed for mild pain, fever or headache.  Marland Kitchen amitriptyline (ELAVIL) 25 MG tablet Take 25 mg by mouth at bedtime.  . Calcium Carb-Cholecalciferol 600-800 MG-UNIT TABS Take 1 capsule by mouth daily.   . Cholecalciferol (VITAMIN D3) 1000 units CAPS Take 1,000 Units by mouth daily.  . clotrimazole-betamethasone (LOTRISONE) cream Apply 1 application topically daily as needed (fungus).   . CRANBERRY PO Take 4,200 mg by mouth daily.  . cyanocobalamin (,VITAMIN B-12,) 1000 MCG/ML injection Inject 1,000 mcg every 30 (thirty) days into the skin.  Marland Kitchen denosumab (XGEVA) 120 MG/1.7ML SOLN injection Inject 120 mg into the skin every 30 (thirty) days.   Marland Kitchen diltiazem (CARDIZEM) 90 MG tablet TAKE ONE TABLET BY MOUTH TWICE DAILY  . docusate sodium (COLACE) 100 MG capsule Take 100 mg by mouth daily as needed for mild constipation.  . fludrocortisone (FLORINEF) 0.1 MG tablet Take 1 tablet (0.1 mg total) daily by mouth.  Marland Kitchen glipiZIDE (GLUCOTROL XL) 5 MG 24 hr tablet Take 5 mg by mouth daily with breakfast.   . Leuprolide Acetate, 6 Month, (LUPRON DEPOT) 45 MG injection Inject 45 mg into the muscle every 6 (six) months.  Marland Kitchen lisinopril (PRINIVIL,ZESTRIL) 20 MG tablet Take 1 tablet (20 mg total) daily by mouth.  . loperamide (IMODIUM) 2 MG capsule Take 2 mg by mouth as needed for diarrhea or loose stools.  . metFORMIN (GLUCOPHAGE) 850 MG  tablet Take 850 mg by mouth 2 (two) times daily with a meal.   . nystatin (MYCOSTATIN) powder Apply topically 4 (four) times daily as needed (fungus).   Marland Kitchen omeprazole (PRILOSEC) 20 MG capsule Take 20 mg by mouth daily.   . potassium chloride SA (K-DUR,KLOR-CON) 20 MEQ tablet Take 1 tablet (20 mEq total) by mouth daily. Please take 2 tablets for next weeks, then daily  . predniSONE (DELTASONE) 5 MG tablet TAKE 1 TABLET BY MOUTH TWICE DAILY WITH MEALS  . sennosides-docusate sodium (SENOKOT-S) 8.6-50 MG tablet Take 1 tablet by mouth daily.  Marland Kitchen warfarin (COUMADIN) 2.5 MG tablet Take as directed by Coumadin Clinic.   No facility-administered encounter medications on file as of 03/03/2017.     ALLERGIES:  No Known Allergies   PHYSICAL EXAM:  ECOG Performance status: 1-2 - Symptomatic; requires occasional assistance, but is mostly independent.   Vitals:   03/03/17 1420  BP: (!) 190/92  Pulse: 90  Resp: 18  Temp: 98.1 F (36.7 C)  SpO2: 96%   There were no vitals filed for this visit.  Physical Exam  Constitutional: He is oriented to person, place, and time and well-developed, well-nourished, and in no distress.  HENT:  Head: Normocephalic.  Mouth/Throat: Oropharynx is clear and moist. No oropharyngeal exudate.  Eyes: Conjunctivae are normal. Pupils are equal, round, and reactive to light. No scleral icterus.  Neck: Normal range of motion. Neck supple.  Cardiovascular: Normal rate.  Irregularly regular rhythm  Pulmonary/Chest: Effort normal and breath sounds normal. No respiratory distress.  Abdominal: Soft. Bowel sounds are normal. There is no tenderness.  Musculoskeletal: Normal range of motion. He exhibits edema (1+ BLE ankle edema).  Lymphadenopathy:    He has no cervical adenopathy.       Right: No supraclavicular adenopathy present.       Left: No supraclavicular adenopathy present.  Neurological: He is alert and oriented to person, place, and time. No cranial nerve deficit.  Gait normal.  Skin: Skin is warm and dry.  No rash noted.  Psychiatric: Mood, memory, affect and judgment normal.  Nursing note and vitals reviewed.    LABORATORY DATA:  I have reviewed the labs as listed.  CBC    Component Value Date/Time   WBC 8.5 02/09/2017 1056   RBC 4.46 02/09/2017 1056   HGB 14.5 02/09/2017 1056   HCT 41.4 02/09/2017 1056   PLT 175 02/09/2017 1056   MCV 92.8 02/09/2017 1056   MCH 32.5 02/09/2017 1056   MCHC 35.0 02/09/2017 1056   RDW 13.1 02/09/2017 1056   LYMPHSABS 2.1 02/09/2017 1056   MONOABS 1.0 02/09/2017 1056   EOSABS 0.0 02/09/2017 1056   BASOSABS 0.0 02/09/2017 1056   CMP Latest Ref Rng & Units 02/19/2017 02/09/2017 01/12/2017  Glucose 65 - 99 mg/dL - 256(H) 279(H)  BUN 6 - 20 mg/dL - 17 15  Creatinine 0.61 - 1.24 mg/dL - 0.77 0.81  Sodium 135 - 145 mmol/L - 134(L) 135  Potassium 3.5 - 5.1 mmol/L - 4.2 3.3(L)  Chloride 101 - 111 mmol/L - 97(L) 96(L)  CO2 22 - 32 mmol/L - 27 29  Calcium 8.9 - 10.3 mg/dL - 9.8 9.6  Total Protein 6.0 - 8.5 g/dL 5.8(L) 6.6 6.4(L)  Total Bilirubin 0.3 - 1.2 mg/dL - 0.8 1.0  Alkaline Phos 38 - 126 U/L - 83 74  AST 15 - 41 U/L - 18 17  ALT 17 - 63 U/L - 19 16(L)   Results for MAISON, KESTENBAUM (MRN [id number])   Ref. Range 02/19/2017 10:38  Albumin SerPl Elph-Mcnc Latest Ref Range: 2.9 - 4.4 g/dL 3.2  Albumin/Glob SerPl Latest Ref Range: 0.7 - 1.7  1.3  Alpha2 Glob SerPl Elph-Mcnc Latest Ref Range: 0.4 - 1.0 g/dL 0.7  Alpha 1 Latest Ref Range: 0.0 - 0.4 g/dL 0.2  Gamma Glob SerPl Elph-Mcnc Latest Ref Range: 0.4 - 1.8 g/dL 0.9  IFE 1 Unknown Comment  Globulin, Total Latest Ref Range: 2.2 - 3.9 g/dL 2.6  B-Globulin SerPl Elph-Mcnc Latest Ref Range: 0.7 - 1.3 g/dL 0.8  IgG (Immunoglobin G), Serum Latest Ref Range: 700 - 1,600 mg/dL 644 (L)  IgM (Immunoglobulin M), Srm Latest Ref Range: 15 - 143 mg/dL 400 (H)  M Protein SerPl Elph-Mcnc Latest Ref Range: Not Observed g/dL 0.3 (H)        PENDING LABS:      DIAGNOSTIC IMAGING:  Last bone scan: 08/26/15    PATHOLOGY:  Bowel resection path: 02/14/04     Prostate biopsy: 08/10/13             ASSESSMENT & PLAN:   Stage IV metastatic prostate cancer:  -Diagnosed 07/2013 revealing prostate adenocarcinoma with perineural invasion, Gleason 7 (4+3); now with bone mets.  -PSA has been excellent at 0.01 for quite some time.   -Continue Zytiga/prednisone.  Reports compliance with medications as prescribed; denies any missed doses.   -Continue Lupron androgen-deprivation injections every 6 months; Lupron injection to be given today as scheduled.  -No need for restaging imaging at this time given stability of PSA.  Should his PSA begin to increase, will restage with CT chest/abd/pelvis and bone scan.   -Return for follow-up visit in late 03/2017 as previously planned, in anticipation for his short-term relocation to [place] for the winter months.   Bone mets:  -Last bone scan in 08/2015 with improving abnormal activity in lower lumbar spine and sternum; stable abnormal activity in right side of T12 vertebral body with no new areas of abnormal activity.  -Patient  with no new bone pain or complaints today.  -Continue Xgeva injections monthly (his last Xgeva injection was given on 04/01/16); calcium normal today and he will receive Xgeva as scheduled.  -Of note: Given that he and his wife relocate to [place] each winter, he will miss a few injections of his Xgeva. While this is not favorable, he wishes to proceed with missing what will likely be his January, February, and March Xgeva doses.  Will discuss plan at next follow-up; perhaps he can receive Xgeva injection at a cancer center in [place]?   Recently diagnosed vitamin B12 deficiency:  -Being managed by neurology with vitamin B12 injections, then oral B12 supplementation per patient. Provided reassurance and education about vitamin B12 deficiency.   -Continue medications as directed.    Recently diagnosed IgM monoclonal gammopathy:  -Labs drawn by neurology on 02/19/17.  M-spike low at 0.3.  IFE showed IgM monoclonal protein with lambda light chain specificity.  In 01/2017, his calcium and kidney function were normal. No anemia, neutropenia, or thrombocytopenia appreciated.  -With IgM monoclonal protein, more likely to be Waldenstroms gammopathy vs monoclonal gammopathy of unknown significance (MGUS). Not likely multiple myeloma. Discussed with Dr. Talbert Cage. Given that his M-spike is so low, his comorbid conditions including metastatic prostate cancer and his age, we favor continuing to monitor this finding with peripheral blood work evaluation in ~6 months vs more invasive testing (like bone marrow biopsy).  Mr. Jeschke agrees with this plan.   Medication reconciliation/education:  -I will ask nursing to spend some time with Mr. Bilotti today reviewing his medications and when to take them.  He was provided written instructions and education by nursing today.   GIST:  -Diagnosed in 01/2004; s/p partial resection. No clinical symptoms to suggest recurrence at this time.  -Continue to monitor; can image as clinically indicated, but no routine imaging necessary.   DLBCL:  -Diagnosed in 2005. No lymphadenopathy. No anemia or thrombocytopenia. No leukocytosis. No "B" symptoms including fever, chills, or night sweats.  -Continue to monitor.     Dispo:  -Monthly labs with Xgeva injections.  -Next dose of Lupron due in 02/2017.  -Return to cancer center for follow-up visit in late December in anticipation of his temporary relocation to [place] for the winter.     All questions were answered to patient's stated satisfaction. Encouraged patient to call with any new concerns or questions before his next visit to the cancer center and we can certain see him sooner, if needed.    Plan of care discussed with Dr. Talbert Cage, who agrees with the above aforementioned.     Orders placed this  encounter:  No orders of the defined types were placed in this encounter.     Mike Craze, NP [place] [phone]

## 2017-03-03 ENCOUNTER — Encounter (HOSPITAL_BASED_OUTPATIENT_CLINIC_OR_DEPARTMENT_OTHER): Payer: Medicare Other

## 2017-03-03 ENCOUNTER — Encounter (HOSPITAL_COMMUNITY): Payer: Medicare Other | Attending: Oncology | Admitting: Adult Health

## 2017-03-03 ENCOUNTER — Encounter (HOSPITAL_COMMUNITY): Payer: Self-pay | Admitting: Adult Health

## 2017-03-03 ENCOUNTER — Other Ambulatory Visit: Payer: Self-pay

## 2017-03-03 VITALS — BP 190/92 | HR 90 | Temp 98.1°F | Resp 18

## 2017-03-03 DIAGNOSIS — Z5111 Encounter for antineoplastic chemotherapy: Secondary | ICD-10-CM

## 2017-03-03 DIAGNOSIS — D472 Monoclonal gammopathy: Secondary | ICD-10-CM | POA: Diagnosis not present

## 2017-03-03 DIAGNOSIS — Z7984 Long term (current) use of oral hypoglycemic drugs: Secondary | ICD-10-CM | POA: Insufficient documentation

## 2017-03-03 DIAGNOSIS — I4891 Unspecified atrial fibrillation: Secondary | ICD-10-CM | POA: Insufficient documentation

## 2017-03-03 DIAGNOSIS — C8590 Non-Hodgkin lymphoma, unspecified, unspecified site: Secondary | ICD-10-CM | POA: Diagnosis not present

## 2017-03-03 DIAGNOSIS — C7951 Secondary malignant neoplasm of bone: Secondary | ICD-10-CM | POA: Diagnosis not present

## 2017-03-03 DIAGNOSIS — E538 Deficiency of other specified B group vitamins: Secondary | ICD-10-CM

## 2017-03-03 DIAGNOSIS — C61 Malignant neoplasm of prostate: Secondary | ICD-10-CM

## 2017-03-03 DIAGNOSIS — Z79899 Other long term (current) drug therapy: Secondary | ICD-10-CM

## 2017-03-03 DIAGNOSIS — C49A0 Gastrointestinal stromal tumor, unspecified site: Secondary | ICD-10-CM | POA: Diagnosis not present

## 2017-03-03 DIAGNOSIS — Z8500 Personal history of malignant neoplasm of unspecified digestive organ: Secondary | ICD-10-CM | POA: Insufficient documentation

## 2017-03-03 DIAGNOSIS — E119 Type 2 diabetes mellitus without complications: Secondary | ICD-10-CM | POA: Insufficient documentation

## 2017-03-03 DIAGNOSIS — C8330 Diffuse large B-cell lymphoma, unspecified site: Secondary | ICD-10-CM | POA: Insufficient documentation

## 2017-03-03 DIAGNOSIS — Z7901 Long term (current) use of anticoagulants: Secondary | ICD-10-CM | POA: Insufficient documentation

## 2017-03-03 DIAGNOSIS — I10 Essential (primary) hypertension: Secondary | ICD-10-CM | POA: Insufficient documentation

## 2017-03-03 LAB — ANTI-MYELIN ASSOC. GLY. IGM: Anti-Myelin Assoc. Glycop.IgM*: <1:10 {titer}

## 2017-03-03 MED ORDER — LEUPROLIDE ACETATE (6 MONTH) 45 MG IM KIT
45.0000 mg | PACK | Freq: Once | INTRAMUSCULAR | Status: AC
  Administered 2017-03-03: 45 mg via INTRAMUSCULAR
  Filled 2017-03-03: qty 45

## 2017-03-03 NOTE — Progress Notes (Signed)
[name] tolerated Lupron injection well without complaints or incident. Pt discharged self ambulatory in satisfactory condition accompanied by his wife

## 2017-03-03 NOTE — Patient Instructions (Signed)
[place] at [facility] Discharge Instructions  RECOMMENDATIONS MADE BY THE CONSULTANT AND ANY TEST RESULTS WILL BE SENT TO YOUR REFERRING PHYSICIAN.  Received Lupron injection today. Follow-up as scheduled. Call clinic for any questions or concerns  Thank you for choosing [place] at [facility] to provide your oncology and hematology care.  To afford each patient quality time with our provider, please arrive at least 15 minutes before your scheduled appointment time.    If you have a lab appointment with the [facility] please come in thru the  Main Entrance and check in at the main information desk  You need to re-schedule your appointment should you arrive 10 or more minutes late.  We strive to give you quality time with our providers, and arriving late affects you and other patients whose appointments are after yours.  Also, if you no show three or more times for appointments you may be dismissed from the clinic at the providers discretion.     Again, thank you for choosing [facility].  Our hope is that these requests will decrease the amount of time that you wait before being seen by our physicians.       _____________________________________________________________  Should you have questions after your visit to [facility], please contact our office at [phone] between the hours of 8:30 a.m. and 4:30 p.m.  Voicemails left after 4:30 p.m. will not be returned until the following business day.  For prescription refill requests, have your pharmacy contact our office.       Resources For Cancer Patients and their Caregivers ? American Cancer Society: Can assist with transportation, wigs, general needs, runs Look Good Feel Better.        607-302-8507 ? Cancer Care: Provides financial assistance, online support groups, medication/co-pay assistance.  1-800-813-HOPE 684 329 5154) ? [place] Assists [place] cancer patients and their families through emotional , educational and financial support.  (613)650-8303 ? [place] DSS Where to apply for food stamps, Medicaid and utility assistance. 562-455-3637 ? RCATS: Transportation to medical appointments. 440-571-8993 ? Social Security Administration: May apply for disability if have a Stage IV cancer. (313)545-5896 (970) 433-4232 ? [employer], Disability and Transit Services: Assists with nutrition, care and transit needs. [place] Support Programs: @10RELATIVEDAYS @ > Cancer Support Group  2nd Tuesday of the month 1pm-2pm, Journey Room  > Creative Journey  3rd Tuesday of the month 1130am-1pm, Journey Room  > Look Good Feel Better  1st Wednesday of the month 10am-12 noon, Journey Room (Call [place] to register [phone])

## 2017-03-03 NOTE — Patient Instructions (Addendum)
[place] at [facility] Discharge Instructions  RECOMMENDATIONS MADE BY THE CONSULTANT AND ANY TEST RESULTS WILL BE SENT TO YOUR REFERRING PHYSICIAN.  You were seen today by Mike Craze, NP I have provided you with a list of when to take your medications You will continue to get your Lupron injection as scheduled See schedulers up front for appointments   Thank you for choosing [place] at [facility] to provide your oncology and hematology care.  To afford each patient quality time with our provider, please arrive at least 15 minutes before your scheduled appointment time.    If you have a lab appointment with the [place] please come in thru the  Main Entrance and check in at the main information desk  You need to re-schedule your appointment should you arrive 10 or more minutes late.  We strive to give you quality time with our providers, and arriving late affects you and other patients whose appointments are after yours.  Also, if you no show three or more times for appointments you may be dismissed from the clinic at the providers discretion.     Again, thank you for choosing [facility].  Our hope is that these requests will decrease the amount of time that you wait before being seen by our physicians.       _____________________________________________________________  Should you have questions after your visit to [facility], please contact our office at [phone] between the hours of 8:30 a.m. and 4:30 p.m.  Voicemails left after 4:30 p.m. will not be returned until the following business day.  For prescription refill requests, have your pharmacy contact our office.       Resources For Cancer Patients and their Caregivers ? American Cancer Society: Can assist with transportation, wigs, general needs, runs Look Good Feel Better.        (972)048-7304 ? Cancer Care: Provides financial  assistance, online support groups, medication/co-pay assistance.  1-800-813-HOPE (901) 060-7450) ? [place] Assists [place] cancer patients and their families through emotional , educational and financial support.  650-887-6775 ? [place] DSS Where to apply for food stamps, Medicaid and utility assistance. 667-425-4409 ? RCATS: Transportation to medical appointments. 518 311 2740 ? Social Security Administration: May apply for disability if have a Stage IV cancer. 984 583 0133 539-736-6776 ? [employer], Disability and Transit Services: Assists with nutrition, care and transit needs. [place] Support Programs: @10RELATIVEDAYS @ > Cancer Support Group  2nd Tuesday of the month 1pm-2pm, Journey Room  > Creative Journey  3rd Tuesday of the month 1130am-1pm, Journey Room  > Look Good Feel Better  1st Wednesday of the month 10am-12 noon, Journey Room (Call [place] to register [phone])

## 2017-03-04 ENCOUNTER — Telehealth: Payer: Self-pay | Admitting: *Deleted

## 2017-03-04 NOTE — Telephone Encounter (Signed)
-----   Message from Britt Bottom, MD sent at 03/03/2017  5:03 PM EST ----- Please let him know that the last blood test looked normal

## 2017-03-04 NOTE — Telephone Encounter (Signed)
Spoke with [name] this am and reviewed below lab result with him. He verbalized understanding of same/fim

## 2017-03-05 DIAGNOSIS — D649 Anemia, unspecified: Secondary | ICD-10-CM | POA: Diagnosis not present

## 2017-03-08 ENCOUNTER — Ambulatory Visit (HOSPITAL_COMMUNITY)
Admission: RE | Admit: 2017-03-08 | Discharge: 2017-03-08 | Disposition: A | Discharge status: Home / Self Care | Payer: Medicare Other | Source: Ambulatory Visit | Attending: Internal Medicine | Admitting: Internal Medicine

## 2017-03-08 ENCOUNTER — Other Ambulatory Visit (HOSPITAL_COMMUNITY): Payer: Self-pay | Admitting: Internal Medicine

## 2017-03-08 DIAGNOSIS — R0989 Other specified symptoms and signs involving the circulatory and respiratory systems: Secondary | ICD-10-CM | POA: Insufficient documentation

## 2017-03-08 DIAGNOSIS — R300 Dysuria: Secondary | ICD-10-CM | POA: Diagnosis not present

## 2017-03-08 DIAGNOSIS — R05 Cough: Secondary | ICD-10-CM | POA: Diagnosis not present

## 2017-03-08 DIAGNOSIS — J209 Acute bronchitis, unspecified: Secondary | ICD-10-CM | POA: Diagnosis not present

## 2017-03-08 DIAGNOSIS — E1165 Type 2 diabetes mellitus with hyperglycemia: Secondary | ICD-10-CM | POA: Diagnosis not present

## 2017-03-08 DIAGNOSIS — I700 Atherosclerosis of aorta: Secondary | ICD-10-CM | POA: Insufficient documentation

## 2017-03-08 DIAGNOSIS — R059 Cough, unspecified: Secondary | ICD-10-CM

## 2017-03-08 DIAGNOSIS — N390 Urinary tract infection, site not specified: Secondary | ICD-10-CM | POA: Diagnosis not present

## 2017-03-09 ENCOUNTER — Other Ambulatory Visit (HOSPITAL_COMMUNITY): Payer: Medicare Other

## 2017-03-09 ENCOUNTER — Ambulatory Visit (HOSPITAL_COMMUNITY): Payer: Medicare Other

## 2017-03-15 ENCOUNTER — Ambulatory Visit (INDEPENDENT_AMBULATORY_CARE_PROVIDER_SITE_OTHER): Payer: Medicare Other | Admitting: *Deleted

## 2017-03-15 DIAGNOSIS — Z5181 Encounter for therapeutic drug level monitoring: Secondary | ICD-10-CM

## 2017-03-15 DIAGNOSIS — I480 Paroxysmal atrial fibrillation: Secondary | ICD-10-CM | POA: Diagnosis not present

## 2017-03-15 LAB — POCT INR: INR: 4.8

## 2017-03-16 ENCOUNTER — Emergency Department (HOSPITAL_COMMUNITY): Payer: Medicare Other

## 2017-03-16 ENCOUNTER — Observation Stay (HOSPITAL_COMMUNITY)
Admission: EM | Admit: 2017-03-16 | Discharge: 2017-03-18 | Disposition: A | Discharge status: Home / Self Care | Payer: Medicare Other | Attending: Internal Medicine | Admitting: Internal Medicine

## 2017-03-16 ENCOUNTER — Encounter (HOSPITAL_COMMUNITY): Payer: Self-pay | Admitting: *Deleted

## 2017-03-16 ENCOUNTER — Other Ambulatory Visit: Payer: Self-pay

## 2017-03-16 DIAGNOSIS — J209 Acute bronchitis, unspecified: Secondary | ICD-10-CM | POA: Diagnosis not present

## 2017-03-16 DIAGNOSIS — R2241 Localized swelling, mass and lump, right lower limb: Secondary | ICD-10-CM | POA: Diagnosis present

## 2017-03-16 DIAGNOSIS — Z79899 Other long term (current) drug therapy: Secondary | ICD-10-CM | POA: Insufficient documentation

## 2017-03-16 DIAGNOSIS — R062 Wheezing: Secondary | ICD-10-CM | POA: Insufficient documentation

## 2017-03-16 DIAGNOSIS — I10 Essential (primary) hypertension: Secondary | ICD-10-CM | POA: Insufficient documentation

## 2017-03-16 DIAGNOSIS — R7989 Other specified abnormal findings of blood chemistry: Secondary | ICD-10-CM | POA: Diagnosis not present

## 2017-03-16 DIAGNOSIS — C8590 Non-Hodgkin lymphoma, unspecified, unspecified site: Secondary | ICD-10-CM | POA: Diagnosis present

## 2017-03-16 DIAGNOSIS — E1165 Type 2 diabetes mellitus with hyperglycemia: Secondary | ICD-10-CM | POA: Diagnosis not present

## 2017-03-16 DIAGNOSIS — I509 Heart failure, unspecified: Secondary | ICD-10-CM | POA: Diagnosis not present

## 2017-03-16 DIAGNOSIS — C7951 Secondary malignant neoplasm of bone: Secondary | ICD-10-CM | POA: Insufficient documentation

## 2017-03-16 DIAGNOSIS — R609 Edema, unspecified: Secondary | ICD-10-CM | POA: Diagnosis not present

## 2017-03-16 DIAGNOSIS — R791 Abnormal coagulation profile: Secondary | ICD-10-CM | POA: Diagnosis not present

## 2017-03-16 DIAGNOSIS — C61 Malignant neoplasm of prostate: Secondary | ICD-10-CM

## 2017-03-16 DIAGNOSIS — I4891 Unspecified atrial fibrillation: Secondary | ICD-10-CM | POA: Diagnosis not present

## 2017-03-16 DIAGNOSIS — E119 Type 2 diabetes mellitus without complications: Secondary | ICD-10-CM | POA: Diagnosis not present

## 2017-03-16 DIAGNOSIS — Z8546 Personal history of malignant neoplasm of prostate: Secondary | ICD-10-CM | POA: Diagnosis not present

## 2017-03-16 DIAGNOSIS — Z7901 Long term (current) use of anticoagulants: Secondary | ICD-10-CM | POA: Insufficient documentation

## 2017-03-16 DIAGNOSIS — J9801 Acute bronchospasm: Secondary | ICD-10-CM | POA: Diagnosis not present

## 2017-03-16 DIAGNOSIS — Z7984 Long term (current) use of oral hypoglycemic drugs: Secondary | ICD-10-CM | POA: Insufficient documentation

## 2017-03-16 DIAGNOSIS — R0602 Shortness of breath: Secondary | ICD-10-CM | POA: Diagnosis not present

## 2017-03-16 DIAGNOSIS — R05 Cough: Secondary | ICD-10-CM | POA: Diagnosis not present

## 2017-03-16 HISTORY — DX: Hypomagnesemia: E83.42

## 2017-03-16 LAB — CBC
HCT: 42.2 % (ref 39.0–52.0)
Hemoglobin: 14.3 g/dL (ref 13.0–17.0)
MCH: 31.8 pg (ref 26.0–34.0)
MCHC: 33.9 g/dL (ref 30.0–36.0)
MCV: 94 fL (ref 78.0–100.0)
Platelets: 175 10*3/uL (ref 150–400)
RBC: 4.49 MIL/uL (ref 4.22–5.81)
RDW: 13.5 % (ref 11.5–15.5)
WBC: 9.7 10*3/uL (ref 4.0–10.5)

## 2017-03-16 LAB — PROTIME-INR
INR: 3.69
Prothrombin Time: 36.3 seconds — ABNORMAL HIGH (ref 11.4–15.2)

## 2017-03-16 LAB — BASIC METABOLIC PANEL
Anion gap: 6 (ref 5–15)
BUN: 13 mg/dL (ref 6–20)
CO2: 31 mmol/L (ref 22–32)
Calcium: 9.8 mg/dL (ref 8.9–10.3)
Chloride: 103 mmol/L (ref 101–111)
Creatinine, Ser: 0.65 mg/dL (ref 0.61–1.24)
GFR calc Af Amer: >60 mL/min (ref >60)
GFR calc non Af Amer: >60 mL/min (ref >60)
Glucose, Bld: 139 mg/dL — ABNORMAL HIGH (ref 65–99)
Potassium: 3.2 mmol/L — ABNORMAL LOW (ref 3.5–5.1)
Sodium: 140 mmol/L (ref 135–145)

## 2017-03-16 LAB — BRAIN NATRIURETIC PEPTIDE: B Natriuretic Peptide: 132 pg/mL — ABNORMAL HIGH (ref 0.0–100.0)

## 2017-03-16 LAB — D-DIMER, QUANTITATIVE: D-Dimer, Quant: 1.12 ug/mL-FEU — ABNORMAL HIGH (ref 0.00–0.50)

## 2017-03-16 LAB — CBG MONITORING, ED: Glucose-Capillary: 151 mg/dL — ABNORMAL HIGH (ref 65–99)

## 2017-03-16 LAB — I-STAT TROPONIN, ED: Troponin i, poc: 0 ng/mL (ref 0.00–0.08)

## 2017-03-16 LAB — MAGNESIUM: Magnesium: 1.3 mg/dL — ABNORMAL LOW (ref 1.7–2.4)

## 2017-03-16 MED ORDER — METOPROLOL TARTRATE 5 MG/5ML IV SOLN
2.5000 mg | Freq: Once | INTRAVENOUS | Status: AC
  Administered 2017-03-16: 2.5 mg via INTRAVENOUS
  Filled 2017-03-16: qty 5

## 2017-03-16 MED ORDER — DILTIAZEM HCL 30 MG PO TABS
90.0000 mg | ORAL_TABLET | Freq: Two times a day (BID) | ORAL | Status: DC
  Administered 2017-03-17 – 2017-03-18 (×4): 90 mg via ORAL
  Filled 2017-03-16 (×4): qty 3

## 2017-03-16 MED ORDER — LISINOPRIL 10 MG PO TABS
20.0000 mg | ORAL_TABLET | Freq: Every day | ORAL | Status: DC
  Administered 2017-03-17 – 2017-03-18 (×2): 20 mg via ORAL
  Filled 2017-03-16 (×2): qty 2

## 2017-03-16 MED ORDER — PANTOPRAZOLE SODIUM 40 MG PO TBEC
40.0000 mg | DELAYED_RELEASE_TABLET | Freq: Every day | ORAL | Status: DC
  Administered 2017-03-17 – 2017-03-18 (×2): 40 mg via ORAL
  Filled 2017-03-16 (×2): qty 1

## 2017-03-16 MED ORDER — GLIPIZIDE ER 5 MG PO TB24
5.0000 mg | ORAL_TABLET | Freq: Every day | ORAL | Status: DC
  Administered 2017-03-17 – 2017-03-18 (×2): 5 mg via ORAL
  Filled 2017-03-16 (×2): qty 1

## 2017-03-16 MED ORDER — FLUDROCORTISONE ACETATE 0.1 MG PO TABS
0.1000 mg | ORAL_TABLET | Freq: Every day | ORAL | Status: DC
  Administered 2017-03-17 – 2017-03-18 (×2): 0.1 mg via ORAL
  Filled 2017-03-16 (×6): qty 1

## 2017-03-16 MED ORDER — ACETAMINOPHEN 500 MG PO TABS: 500.0000 mg | ORAL_TABLET | Freq: Every day | ORAL | Status: DC | PRN

## 2017-03-16 MED ORDER — POTASSIUM CHLORIDE CRYS ER 20 MEQ PO TBCR
40.0000 meq | EXTENDED_RELEASE_TABLET | Freq: Once | ORAL | Status: AC
  Administered 2017-03-16: 40 meq via ORAL
  Filled 2017-03-16: qty 2

## 2017-03-16 MED ORDER — METFORMIN HCL 500 MG PO TABS
500.0000 mg | ORAL_TABLET | Freq: Two times a day (BID) | ORAL | Status: DC
  Administered 2017-03-17 – 2017-03-18 (×3): 500 mg via ORAL
  Filled 2017-03-16 (×3): qty 1

## 2017-03-16 MED ORDER — POTASSIUM CHLORIDE CRYS ER 10 MEQ PO TBCR
10.0000 meq | EXTENDED_RELEASE_TABLET | Freq: Every day | ORAL | Status: DC
  Administered 2017-03-17 – 2017-03-18 (×2): 10 meq via ORAL
  Filled 2017-03-16 (×2): qty 1

## 2017-03-16 MED ORDER — SENNOSIDES-DOCUSATE SODIUM 8.6-50 MG PO TABS
ORAL_TABLET | Freq: Every day | ORAL | Status: DC
  Administered 2017-03-17: 09:00:00 via ORAL
  Administered 2017-03-18: 1 via ORAL
  Filled 2017-03-16 (×2): qty 1

## 2017-03-16 MED ORDER — DOCUSATE SODIUM 100 MG PO CAPS: 100.0000 mg | ORAL_CAPSULE | Freq: Every day | ORAL | Status: DC | PRN

## 2017-03-16 MED ORDER — IPRATROPIUM-ALBUTEROL 0.5-2.5 (3) MG/3ML IN SOLN
3.0000 mL | Freq: Once | RESPIRATORY_TRACT | Status: AC
  Administered 2017-03-16: 3 mL via RESPIRATORY_TRACT
  Filled 2017-03-16: qty 3

## 2017-03-16 MED ORDER — PREDNISONE 20 MG PO TABS
40.0000 mg | ORAL_TABLET | Freq: Once | ORAL | Status: DC
  Filled 2017-03-16: qty 2

## 2017-03-16 MED ORDER — ABIRATERONE ACETATE 250 MG PO TABS
1000.0000 mg | ORAL_TABLET | Freq: Every day | ORAL | Status: DC
  Administered 2017-03-17 – 2017-03-18 (×2): 1000 mg via ORAL
  Filled 2017-03-16: qty 4

## 2017-03-16 MED ORDER — METHYLPREDNISOLONE SODIUM SUCC 125 MG IJ SOLR
125.0000 mg | Freq: Once | INTRAMUSCULAR | Status: AC
  Administered 2017-03-16: 125 mg via INTRAVENOUS
  Filled 2017-03-16: qty 2

## 2017-03-16 MED ORDER — MAGNESIUM SULFATE 2 GM/50ML IV SOLN
2.0000 g | Freq: Once | INTRAVENOUS | Status: AC
  Administered 2017-03-16: 2 g via INTRAVENOUS
  Filled 2017-03-16: qty 50

## 2017-03-16 MED ORDER — VITAMIN D 1000 UNITS PO TABS
1000.0000 [IU] | ORAL_TABLET | Freq: Every day | ORAL | Status: DC
  Administered 2017-03-17 – 2017-03-18 (×2): 1000 [IU] via ORAL
  Filled 2017-03-16 (×2): qty 1

## 2017-03-16 MED ORDER — IOPAMIDOL (ISOVUE-370) INJECTION 76%
80.0000 mL | Freq: Once | INTRAVENOUS | Status: AC | PRN
  Administered 2017-03-16: 80 mL via INTRAVENOUS

## 2017-03-16 NOTE — H&P (Signed)
History and Physical    [name] UUE:[id number] DOB: [birth year] DOA: 03/16/2017  PCP: Rosita Fire, MD   Patient coming from: Home.  I have personally briefly reviewed patient's old medical records in [place]  Chief Complaint: Left leg pain.  HPI: [name] is a 83 y.o. 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 He has been following with his PCP due to having bronchitis for which he was given levofloxacin 500 mg p.o. daily for 7 days, which he just finished today.  However, he mentions in that his breathing is now getting better denies having dry cough, which is occasionally productive, associated with wheezing.  He denies fever, but complains of chills, decreased appetite and fatigue.  No rhinorrhea, no sore throat, no travel history or sick contacts to his knowledge.  He denies chest pain, palpitations, dizziness, diaphoresis, abdominal pain, nausea, emesis, diarrhea, melena or hematochezia.  He denies dysuria or hematuria.  ED Course: Initial vital signs were temperature 36.7C, pulse 94, respirations 24, blood pressure 155/70 mmHg and O2 sat 96% on room air.  He was given supplemental oxygen and a DuoNeb in the emergency department, which improved his breathing, but made him tachycardic.  I ordered oral potassium, magnesium sulfate 2 g IVPB x1, metoprolol 2.5 mg IVP x1 dose and his 90 mg 12-hour diltiazem p.o. x1 and his RVR resolved.  I also added Solu-Medrol 125 mg IVP x1.  His workup shows a normal CBC with WBC 9.7, hemoglobin 14.3 g/dL and platelets 175.  PT was 36.3 seconds and INR 3.69.  D-dimer was 1.12.  His BMP showed a potassium level of 3.2 mmol/L and a glucose level of 139 mg/dL.  His  BNP was 132 pg/mL.  Magnesium level was 1.3 and phosphorus 2.9 mg/dL.  Imaging: CT angiogram of the chest did not show pulmonary embolus, but showed bronchitic changes.  Please see images and full radiology report for further detail.  Review of Systems: As per HPI otherwise 10 point review of systems negative.    Past Medical History:  Diagnosis Date  . Atrial fibrillation ([place])    Dr. Harl Bowie- [facility] follows saw 11'14  . Bladder stones    tx. with oral meds and antibiotics, now surgery planned  . Cataracts, both eyes    surgery planned May 2015  . Diabetes mellitus without complication ([place])   . Family history of breast cancer   . Family history of colon cancer   . 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 Margot Chimes, thus far without recurrence.   . Hypertension   . NHL (non-Hodgkin's lymphoma) ([place]) 08/18/2011   Diffuse large B-cell lymphoma, clinically stage IIIA, CD20 positive, status post cervical lymph node biopsy 08/03/2003 on the left. PET scan was also positive in the spleen and small bowel region, but bone marrow aspiration and biopsy were negative. So he essentially had stage IIIAs. He received R-CHOP x6 cycles with CR established by PET scan criteria on 11/23/2003 with no evidence for relapse th    Past Surgical History:  Procedure Laterality Date  .  CATARACT EXTRACTION Right 08/2013  . CHOLECYSTECTOMY    . CYSTOSCOPY WITH LITHOLAPAXY N/A 08/10/2013   Procedure: CYSTOSCOPY WITH LITHOLAPAXY WITH [name];  Surgeon: [name], MD;  Location: [facility];  Service: Urology;  Laterality: N/A;  . ESOPHAGEAL DILATION N/A 02/27/2016   Procedure: ESOPHAGEAL DILATION;  Surgeon: [name], MD;  Location: [facility];  Service:  Endoscopy;  Laterality: N/A;  . ESOPHAGOGASTRODUODENOSCOPY N/A 02/27/2016   Procedure: ESOPHAGOGASTRODUODENOSCOPY (EGD);  Surgeon: [name], MD;  Location: [facility];  Service: Endoscopy;  Laterality: N/A;  12:00  . INCISION AND DRAINAGE PERIRECTAL ABSCESS    . LYMPH NODE DISSECTION Left    '05  . PORT-A-CATH REMOVAL    . PORTACATH PLACEMENT     insertion and removal -last chemotherapy 10 yrs ago  . TRANSURETHRAL RESECTION OF PROSTATE N/A 08/10/2013   Procedure: TRANSURETHRAL RESECTION OF THE PROSTATE WITH GYRUS INSTRUMENTS;  Surgeon: [name], MD;  Location: [facility];  Service: Urology;  Laterality: N/A;     reports that  has never smoked. he has never used smokeless tobacco. He reports that he does not drink alcohol or use drugs.  No Known Allergies  Family History  Problem Relation Age of Onset  . Heart attack Mother        died in her 75s  . Other Father 55       pneumonia - died  . Breast cancer Sister        dx in her 82s-60s  . Colon cancer Brother        dx in his 64s  . Cancer Sister        TAH/BSO due to "male cancer"  . Breast cancer Daughter 56  . Breast cancer Daughter 36       DCIS - ATM VUS on [employer] 83 gene panel in 2018  . Breast cancer Daughter 64       DCIS - Negative on [employer] My[employer] 25 gene apnel in 2015  . Thyroid cancer Daughter 85       Medullary and Papillary  . Thyroid nodules Grandchild     Prior to Admission medications   Medication Sig Start Date End Date Taking? Authorizing Provider  acetaminophen (TYLENOL) 500 MG tablet Take 500-1,000 mg by mouth daily as needed for mild pain, fever or headache.   Yes [provider]  Calcium Carb-Cholecalciferol 600-800 MG-UNIT TABS Take 1 capsule by mouth daily.    Yes [provider]  Cholecalciferol (VITAMIN D3) 1000 units CAPS Take 1,000 Units by mouth daily.   Yes [provider]  clotrimazole-betamethasone (LOTRISONE) cream Apply 1 application topically  daily as needed (fungus).  06/18/15  Yes [provider]  CRANBERRY PO Take 4,200 mg by mouth daily.   Yes [provider]  cyanocobalamin (,VITAMIN B-12,) 1000 MCG/ML injection Inject 1,000 mcg every 30 (thirty) days into the skin. 02/25/17  Yes [provider]  denosumab (XGEVA) 120 MG/1.7ML SOLN injection Inject 120 mg into the skin every 30 (thirty) days.    Yes [provider]  diltiazem (CARDIZEM) 90 MG tablet TAKE ONE TABLET BY MOUTH TWICE DAILY 10/27/16  Yes Branch, Alphonse Guild, MD  docusate sodium (COLACE) 100 MG capsule Take 100 mg by mouth daily as needed for mild constipation.   Yes [provider]  fludrocortisone (FLORINEF) 0.1 MG tablet Take 1 tablet (0.1 mg total) daily by mouth. 02/25/17  Yes Sater, [name], MD  glipiZIDE (GLUCOTROL XL) 5 MG 24  hr tablet Take 5 mg by mouth daily with breakfast.  06/25/15  Yes [provider]  Leuprolide Acetate, 6 Month, (LUPRON DEPOT) 45 MG injection Inject 45 mg into the muscle every 6 (six) months.   Yes [provider]  lisinopril (PRINIVIL,ZESTRIL) 20 MG tablet Take 1 tablet (20 mg total) daily by mouth. 02/25/17  Yes Sater, [name], MD  loperamide (IMODIUM) 2 MG capsule Take 2 mg by mouth as needed for diarrhea or loose stools.   Yes [provider]  metFORMIN (GLUCOPHAGE) 500 MG tablet Take 500 mg by mouth 2 (two) times daily with a meal.  03/15/15  Yes [provider]  nystatin (MYCOSTATIN) powder Apply topically 4 (four) times daily as needed (fungus).    Yes [provider]  omeprazole (PRILOSEC) 20 MG capsule Take 20 mg by mouth daily.    Yes [provider]  potassium chloride SA (K-DUR,KLOR-CON) 20 MEQ tablet Take 1 tablet (20 mEq total) by mouth daily. Please take 2 tablets for next weeks, then daily Patient taking differently: Take 10 mEq by mouth daily. Please take 2 tablets for next weeks, then daily 01/18/17  Yes Twana First, MD    predniSONE (DELTASONE) 5 MG tablet TAKE 1 TABLET BY MOUTH TWICE DAILY WITH MEALS 01/27/17  Yes Holley Bouche, NP  sennosides-docusate sodium (SENOKOT-S) 8.6-50 MG tablet Take 1 tablet by mouth daily.   Yes [provider]  VENTOLIN HFA 108 (90 Base) MCG/ACT inhaler Inhale 1-2 puffs into the lungs every 6 (six) hours as needed.  03/08/17  Yes [provider]  warfarin (COUMADIN) 2.5 MG tablet Take as directed by [facility]. Patient taking differently: Take 2.5-5 mg by mouth See admin instructions. Take 2.23m on Saturdays and take 569mon all other days 02/28/16  Yes Rehman, NaMechele DawleyMD  abiraterone Acetate (ZYTIGA) 250 MG tablet Take 4 tablets (1,000 mg total) by mouth daily. Take on an empty stomach 1 hour before or 2 hours after a meal 07/24/15   Kefalas, ThManon HildingPA-C  levofloxacin (LEVAQUIN) 500 MG tablet Take 500 mg by mouth daily.  03/08/17   [provider]    Physical Exam: Vitals:   03/16/17 2030 03/16/17 2112 03/16/17 2157 03/16/17 2300  BP: (!) 162/91 (!) 170/99  (!) 167/83  Pulse:  89    Resp:  18  (!) 22  Temp:      TempSrc:      SpO2:  97% 98%   Weight:      Height:        Constitutional: Frail, elderly male.  Eyes: PERRL, lids and conjunctivae normal ENMT: Moderately impaired hearing.  Mucous membranes are moist. Posterior pharynx clear of any exudate or lesions. Neck: normal, supple, no masses, no thyromegaly Respiratory: Decreased breath sounds bilaterally with wheezing and rhonchi. Mildly tachypneic. No accessory muscle use.  Cardiovascular: Irregularly irregular, tachycardic in the 110s and 120s, no murmurs / rubs / gallops.  Bilateral lower extremities edema, R >> L. 2+ pedal pulses. No carotid bruits.  Abdomen: Soft, no tenderness, no masses palpated. No hepatosplenomegaly. Bowel sounds positive.  Musculoskeletal: no clubbing / cyanosis. Good ROM, no contractures. Normal muscle tone.  Skin: Multiple areas of ecchymosis  particularly of upper extremities. Neurologic: CN 2-12 grossly intact. Sensation intact, DTR normal. Strength 5/5 in all 4.  Psychiatric: Normal judgment and insight. Alert and oriented x 3.Mildly anxious mood.    Labs on Admission: I have personally reviewed following labs and imaging studies  CBC: Recent Labs  Lab 03/16/17 1924  WBC 9.7  HGB 14.3  HCT 42.2  MCV 94.0  PLT 803   Basic Metabolic Panel: Recent Labs  Lab 03/16/17 1924  NA 140  K 3.2*  CL 103  CO2 31  GLUCOSE 139*  BUN 13  CREATININE 0.65  CALCIUM 9.8  MG 1.3*   GFR: Estimated Creatinine Clearance: 62.1 mL/min (by C-G formula based on SCr of 0.65 mg/dL). Liver Function Tests: No results for input(s): AST, ALT, ALKPHOS, BILITOT, PROT, ALBUMIN in the last 168 hours. No results for input(s): LIPASE, AMYLASE in the last 168 hours. No results for input(s): AMMONIA in the last 168 hours. Coagulation Profile: Recent Labs  Lab 03/15/17 0908 03/16/17 1924  INR 4.8 3.69   Cardiac Enzymes: No results for input(s): CKTOTAL, CKMB, CKMBINDEX, TROPONINI in the last 168 hours. BNP (last 3 results) No results for input(s): PROBNP in the last 8760 hours. HbA1C: No results for input(s): HGBA1C in the last 72 hours. CBG: Recent Labs  Lab 03/16/17 1903  GLUCAP 151*   Lipid Profile: No results for input(s): CHOL, HDL, LDLCALC, TRIG, CHOLHDL, LDLDIRECT in the last 72 hours. Thyroid Function Tests: No results for input(s): TSH, T4TOTAL, FREET4, T3FREE, THYROIDAB in the last 72 hours. Anemia Panel: No results for input(s): VITAMINB12, FOLATE, FERRITIN, TIBC, IRON, RETICCTPCT in the last 72 hours. Urine analysis: No results found for: COLORURINE, APPEARANCEUR, LABSPEC, PHURINE, GLUCOSEU, HGBUR, BILIRUBINUR, KETONESUR, PROTEINUR, UROBILINOGEN, NITRITE, LEUKOCYTESUR  Radiological Exams on Admission: Dg Chest 2 View  Result Date: 03/16/2017 CLINICAL DATA:  83 y/o  M; shortness of breath, cough, congestion. EXAM:  CHEST  2 VIEW COMPARISON:  03/08/2017 chest radiograph FINDINGS: Mild cardiomegaly. Aortic atherosclerosis with calcification. Clear lungs. No pleural effusion or pneumothorax. No acute osseous abnormality is evident. Right upper quadrant cholecystectomy clips. IMPRESSION: Mild cardiomegaly.  No acute pulmonary process identified. Electronically Signed   By: Kristine Garbe M.D.   On: 03/16/2017 20:23   Ct Angio Chest Pe W And/or Wo Contrast  Result Date: 03/16/2017 CLINICAL DATA:  83 y/o M; PE suspected, intermediate probability, positive D-dimer. EXAM: CT ANGIOGRAPHY CHEST WITH CONTRAST TECHNIQUE: Multidetector CT imaging of the chest was performed using the standard protocol during bolus administration of intravenous contrast. Multiplanar CT image reconstructions and MIPs were obtained to evaluate the vascular anatomy. CONTRAST:  80 cc Isovue 370 COMPARISON:  10/05/2014 CT of the chest. FINDINGS: Cardiovascular: Mild cardiomegaly. Mild coronary artery calcification. No pericardial effusion. Normal caliber thoracic aorta with mild calcifications. Satisfactory opacification and main pulmonary artery is, respiratory motion artifact in lung bases, no pulmonary embolus identified. Mediastinum/Nodes: Normal thyroid. No mediastinal adenopathy. Patulous esophagus. Lungs/Pleura: Diffuse peribronchial thickening and scattered mucous plugging greatest in the lung bases. No consolidation. Upper Abdomen: Cholecystectomy. Scattered calcifications throughout pancreas compatible sequelae of chronic pancreatitis. Musculoskeletal: Stable sclerosis within the superior sternum. No new acute osseous abnormality identified. Schmorl's node within the right aspect of T12 superior endplate. Review of the MIP images confirms the above findings. IMPRESSION: 1. Respiratory motion artifact.  No pulmonary embolus identified. 2. Diffuse peribronchial thickening and scattered mucous plugging greatest in lung bases compatible with  bronchitis. No consolidation. 3. Patulous esophagus. 4. Mild cardiomegaly, coronary artery calcification, and aortic atherosclerosis. Electronically Signed   By: Kristine Garbe M.D.   On: 03/16/2017 21:57    EKG: Independently reviewed. Vent. rate 94 BPM PR interval * ms QRS duration 141 ms QT/QTc 352/441 ms P-R-T axes * 78 -54 Incomplete analysis due to  missing data in precordial lead(s) Atrial fibrillation Right bundle branch block Missing lead(s): V6  Assessment/Plan Principal Problem:   Acute bronchitis with bronchospasm Telemetry/observation. Continue supplemental oxygen. Continue bronchodilators (Xopenex+ipratropium). A single dose of Solu-Medrol given in the ED. He just completed Levaquin course.  Active Problems:   Essential hypertension Continue Cardizem 90 mg p.o. every 2 hours. Continue Prinivil 20 mg p.o. daily. Monitor blood pressure, heart rate, renal function and electrolytes.    ATRIAL FIBRILLATION CHA?DS?-VASc Score of at least 4. Continue diltiazem 90 mg p.o. every 12 hours. Keep electrolytes optimized. Warfarin per pharmacy.    NHL (non-Hodgkin's lymphoma) ([place]) In remission. Following at our cancer center.    Type 2 diabetes mellitus ([facility]) Carbohydrate modified diet. Continue glipizide 5 mg p.o. with breakfast. Continue metformin 500 mg p.o. twice daily. CBG monitoring before meals. The patient received Solu-Medrol in the ED.    Hypomagnesemia Replaced. Follow-up level as needed.    DVT prophylaxis: On warfarin. Code Status: Full code. Family Communication: His daughter Mateo Flow was present in the ED. Disposition Plan: Admit for bronchitis induced bronchospasm. Consults called:  Admission status: Observation/telemetry.   Reubin Milan MD [facility] Pager [phone].  If 7PM-7AM, please contact night-coverage [email or web address] Password [facility]  03/16/2017, 11:47 PM

## 2017-03-16 NOTE — ED Triage Notes (Signed)
Pt with left LE pain since yesterday. Pt very HOH

## 2017-03-16 NOTE — ED Provider Notes (Signed)
[facility] EMERGENCY DEPARTMENT Provider Note   CSN: [id number] Arrival date & time: 03/16/17  1435     History   Chief Complaint Chief Complaint  Patient presents with  . Leg Pain    HPI [name] is a 83 y.o. male.  HPI Patient presents to the emergency room for evaluation of cough congestion and leg swelling.  Patient states his symptoms started about a week ago.   Patient saw his primary care doctor.  He was given a prescription for breathing treatments.  He was not feel like he got much better.  Patient followed up with his doctor today, Dr. Legrand Rams.  According to the family, he noticed asymmetric swelling in his right leg and was sent to the emergency room for further evaluation.  She denies any chest pain.  No fevers.  No vomiting or diarrhea. Past Medical History:  Diagnosis Date  . Atrial fibrillation ([place])    Dr. Harl Bowie- [place] follows saw 11'14  . Bladder stones    tx. with oral meds and antibiotics, now surgery planned  . Cataracts, both eyes    surgery planned May 2015  . Diabetes mellitus without complication ([place])   . Family history of breast cancer   . Family history of colon cancer   . 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 Margot Chimes, thus far without recurrence.   . Hypertension   . NHL (non-Hodgkin's lymphoma) ([place]) 08/18/2011   Diffuse large B-cell lymphoma, clinically stage IIIA, CD20 positive, status post cervical lymph node biopsy 08/03/2003 on the left. PET scan was also positive in the spleen and small bowel region, but bone marrow aspiration and biopsy were negative. So he essentially had stage IIIAs. He received R-CHOP x6 cycles with CR established by PET scan criteria on 11/23/2003 with no evidence for  relapse th    Patient Active Problem List   Diagnosis Date Noted  . 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 Postural dizziness with presyncope 02/19/2017  . Polyneuropathy 02/19/2017  . Numbness 02/19/2017  . Urinary frequency 02/19/2017  . Genetic testing 02/09/2017  . Family history of breast cancer   . Family history of colon cancer   . Other dysphagia 01/21/2016  . Bone metastases ([place]) 08/21/2015  . Prostate cancer ([place]) 09/08/2013  . Hypertrophy of prostate with urinary obstruction and other lower urinary tract symptoms (LUTS) 08/10/2013  . Encounter for therapeutic drug monitoring 06/12/2013  . NHL (non-Hodgkin's lymphoma) ([place]) 08/18/2011  . 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 Long term (current) use of anticoagulants 07/18/2010  . HYPERTENSION, UNSPECIFIED 10/04/2008  . ATRIAL FIBRILLATION 10/04/2008    Past Surgical History:  Procedure Laterality Date  . CATARACT EXTRACTION Right 08/2013  . CHOLECYSTECTOMY    . CYSTOSCOPY WITH LITHOLAPAXY N/A 08/10/2013   Procedure: CYSTOSCOPY WITH LITHOLAPAXY WITH [name];  Surgeon: [name], MD;  Location: [facility] ORS;  Service: Urology;  Laterality: N/A;  . ESOPHAGEAL DILATION N/A 02/27/2016   Procedure: ESOPHAGEAL DILATION;  Surgeon: [name], MD;  Location: [facility] ENDO SUITE;  Service: Endoscopy;  Laterality: N/A;  . ESOPHAGOGASTRODUODENOSCOPY N/A 02/27/2016   Procedure: ESOPHAGOGASTRODUODENOSCOPY (EGD);  Surgeon: [name], MD;  Location: [facility] END[facility] SUITE;  Service: Endoscopy;  Laterality: N/A;  12:00  . INCISION AND  DRAINAGE PERIRECTAL ABSCESS    . LYMPH NODE DISSECTION Left    '05  . PORT-A-CATH REMOVAL    . PORTACATH PLACEMENT     insertion and removal -last chemotherapy 10 yrs ago  . TRANSURETHRAL RESECTION OF PROSTATE N/A 08/10/2013   Procedure: TRANSURETHRAL RESECTION OF THE PROSTATE WITH GYRUS INSTRUMENTS;  Surgeon: [name], MD;  Location: [facility] ORS;   Service: Urology;  Laterality: N/A;       Home Medications    Prior to Admission medications   Medication Sig Start Date End Date Taking? Authorizing Provider  predniSONE (DELTASONE) 5 MG tablet TAKE 1 TABLET BY MOUTH TWICE DAILY WITH MEALS 01/27/17  Yes Holley Bouche, NP  abiraterone Acetate (ZYTIGA) 250 MG tablet Take 4 tablets (1,000 mg total) by mouth daily. Take on an empty stomach 1 hour before or 2 hours after a meal 07/24/15   Kefalas, Manon Hilding, PA-C  acetaminophen (TYLENOL) 500 MG tablet Take 500-1,000 mg by mouth daily as needed for mild pain, fever or headache.    [provider]  amitriptyline (ELAVIL) 25 MG tablet Take 25 mg by mouth at bedtime.    [provider]  Calcium Carb-Cholecalciferol 600-800 MG-UNIT TABS Take 1 capsule by mouth daily.     [provider]  Cholecalciferol (VITAMIN D3) 1000 units CAPS Take 1,000 Units by mouth daily.    [provider]  clotrimazole-betamethasone (LOTRISONE) cream Apply 1 application topically daily as needed (fungus).  06/18/15   [provider]  CRANBERRY PO Take 4,200 mg by mouth daily.    [provider]  cyanocobalamin (,VITAMIN B-12,) 1000 MCG/ML injection Inject 1,000 mcg every 30 (thirty) days into the skin. 02/25/17   [provider]  denosumab (XGEVA) 120 MG/1.7ML SOLN injection Inject 120 mg into the skin every 30 (thirty) days.     [provider]  diltiazem (CARDIZEM) 90 MG tablet TAKE ONE TABLET BY MOUTH TWICE DAILY 10/27/16   Arnoldo Lenis, MD  docusate sodium (COLACE) 100 MG capsule Take 100 mg by mouth daily as needed for mild constipation.    [provider]  fludrocortisone (FLORINEF) 0.1 MG tablet Take 1 tablet (0.1 mg total) daily by mouth. 02/25/17   Sater, [name], MD  glipiZIDE (GLUCOTROL XL) 5 MG 24 hr tablet Take 5 mg by mouth daily with breakfast.  06/25/15   [provider]  Leuprolide Acetate, 6 Month, (LUPRON DEPOT)  45 MG injection Inject 45 mg into the muscle every 6 (six) months.    [provider]  levofloxacin (LEVAQUIN) 500 MG tablet  03/08/17   [provider]  lisinopril (PRINIVIL,ZESTRIL) 20 MG tablet Take 1 tablet (20 mg total) daily by mouth. 02/25/17   Sater, [name], MD  loperamide (IMODIUM) 2 MG capsule Take 2 mg by mouth as needed for diarrhea or loose stools.    [provider]  metFORMIN (GLUCOPHAGE) 850 MG tablet Take 850 mg by mouth 2 (two) times daily with a meal.  03/15/15   [provider]  nystatin (MYCOSTATIN) powder Apply topically 4 (four) times daily as needed (fungus).     [provider]  omeprazole (PRILOSEC) 20 MG capsule Take 20 mg by mouth daily.     [provider]  potassium chloride SA (K-DUR,KLOR-CON) 20 MEQ tablet Take 1 tablet (20 mEq total) by mouth daily. Please take 2 tablets for next weeks, then daily 01/18/17   Twana First, MD  sennosides-docusate sodium (SENOKOT-S) 8.6-50 MG tablet  Take 1 tablet by mouth daily.    [provider]  VENTOLIN HFA 108 [phone] Base) MCG/ACT inhaler  03/08/17   [provider]  warfarin (COUMADIN) 2.5 MG tablet Take as directed by [facility]. 02/28/16   [name], MD    Family History Family History  Problem Relation Age of Onset  . Heart attack Mother        died in her 76s  . Other Father 51       pneumonia - died  . Breast cancer Sister        dx in her 1s-60s  . Colon cancer Brother        dx in his 22s  . Cancer Sister        TAH/BSO due to "male cancer"  . Breast cancer Daughter 7  . Breast cancer Daughter 21       DCIS - ATM VUS on [employer] 83 gene panel in 2018  . Breast cancer Daughter 37       DCIS - Negative on [employer] MyRisk 25 gene apnel in 2015  . Thyroid cancer Daughter 22       Medullary and Papillary  . Thyroid nodules Grandchild     Social History Social History   Tobacco Use  . Smoking status: Never Smoker  .  Smokeless tobacco: Never Used  Substance Use Topics  . Alcohol use: No  . Drug use: No     Allergies   Patient has no known allergies.   Review of Systems Review of Systems  All other systems reviewed and are negative.    Physical Exam Updated Vital Signs BP (!) 170/99 (BP Location: Right Arm)   Pulse 89   Temp 97.9 F (36.6 C) (Oral)   Resp 18   Ht 1.778 m (_0 )   Wt 79.4 kg (175 lb)   SpO2 97%   BMI 25.11 kg/m   Physical Exam  Constitutional: He appears well-developed and well-nourished. No distress.  HENT:  Head: Normocephalic and atraumatic.  Right Ear: External ear normal.  Left Ear: External ear normal.  Eyes: Conjunctivae are normal. Right eye exhibits no discharge. Left eye exhibits no discharge. No scleral icterus.  Neck: Neck supple. No tracheal deviation present.  Cardiovascular: Normal rate, regular rhythm and intact distal pulses.  Pulmonary/Chest: Effort normal and breath sounds normal. No stridor. No respiratory distress. He has no wheezes. He has no rales.  Abdominal: Soft. Bowel sounds are normal. He exhibits no distension. There is no tenderness. There is no rebound and no guarding.  Musculoskeletal: He exhibits edema. He exhibits no tenderness.  asymmetric edema of the right lower extremity compared to the left, no erythema  Neurological: He is alert. He has normal strength. No sensory deficit. Cranial nerve deficit: no gross deficits. He exhibits normal muscle tone. He displays no seizure activity. Coordination normal.  Skin: Skin is warm and dry. No rash noted.  Psychiatric: He has a normal mood and affect.  Nursing note and vitals reviewed.    ED Treatments / Results  Labs (all labs ordered are listed, but only abnormal results are displayed) Labs Reviewed  BASIC METABOLIC PANEL - Abnormal; Notable for the following components:      Result Value   Potassium 3.2 (*)    Glucose, Bld 139 (*)    All other components within normal limits    D-DIMER, QUANTITATIVE (NOT AT [facility]) - Abnormal; Notable for the following components:   D-Dimer, Quant 1.12 (*)  All other components within normal limits  BRAIN NATRIURETIC PEPTIDE - Abnormal; Notable for the following components:   B Natriuretic Peptide 132.0 (*)    All other components within normal limits  PROTIME-INR - Abnormal; Notable for the following components:   Prothrombin Time 36.3 (*)    All other components within normal limits  CBG MONITORING, ED - Abnormal; Notable for the following components:   Glucose-Capillary 151 (*)    All other components within normal limits  CBC  I-STAT TROPONIN, ED    EKG Atrial fibrillation Rate 94 Right bundle branch block Lead V6 is missing No prior EKG for comparison  Radiology Dg Chest 2 View  Result Date: 03/16/2017 CLINICAL DATA:  83 y/o  M; shortness of breath, cough, congestion. EXAM: CHEST  2 VIEW COMPARISON:  03/08/2017 chest radiograph FINDINGS: Mild cardiomegaly. Aortic atherosclerosis with calcification. Clear lungs. No pleural effusion or pneumothorax. No acute osseous abnormality is evident. Right upper quadrant cholecystectomy clips. IMPRESSION: Mild cardiomegaly.  No acute pulmonary process identified. Electronically Signed   By: Kristine Garbe M.D.   On: 03/16/2017 20:23    Procedures Procedures (including critical care time)  Medications Ordered in ED Medications  ipratropium-albuterol (DUONEB) 0.5-2.5 (3) MG/3ML nebulizer solution 3 mL (not administered)  predniSONE (DELTASONE) tablet 40 mg (not administered)     Initial Impression / Assessment and Plan / ED Course  I have reviewed the triage vital signs and the nursing notes.  Pertinent labs & imaging results that were available during my care of the patient were reviewed by me and considered in my medical decision making (see chart for details).  Clinical Course as of Mar 17 2115  Tue Mar 16, 2017  2013 Trop 0.0  [JK]  2032 D dimer is  elevated.  Unable to get doppler study at this time.  Considered giving a dose of Lovenox however the patient is on Coumadin and is therapeutic  [JK]    Clinical Course User Index [JK] Dorie Rank, MD    Patient was sent to the emergency room for evaluation of cough, congestion and shortness of breath.  He was being treated for bronchitis.  Patient continues to cough and his symptoms are suggestive of bronchitis/COPD.  However, the patient does have asymmetric leg swelling and an elevated d-dimer.  He is already on Coumadin.  I think he needs a DVT study because of his elevated d-dimer and the shortness of breath I will order CT angios rule out PE.  Considering his age and comorbidities I think would be best with like to have a Doppler study in the morning.  If we can get the CT Angio tonight.  I will order a breathing treatment and steroids.  I will consult the medical service for admission.  Final Clinical Impressions(s) / ED Diagnoses   Final diagnoses:  Elevated d-dimer  Peripheral edema  Bronchitis with bronchospasm      Dorie Rank, MD 03/16/17 2116

## 2017-03-17 ENCOUNTER — Other Ambulatory Visit: Payer: Self-pay | Admitting: Internal Medicine

## 2017-03-17 ENCOUNTER — Observation Stay (HOSPITAL_COMMUNITY): Payer: Medicare Other

## 2017-03-17 DIAGNOSIS — J209 Acute bronchitis, unspecified: Secondary | ICD-10-CM

## 2017-03-17 DIAGNOSIS — I4891 Unspecified atrial fibrillation: Secondary | ICD-10-CM

## 2017-03-17 DIAGNOSIS — E118 Type 2 diabetes mellitus with unspecified complications: Secondary | ICD-10-CM

## 2017-03-17 DIAGNOSIS — I10 Essential (primary) hypertension: Secondary | ICD-10-CM | POA: Diagnosis not present

## 2017-03-17 DIAGNOSIS — C8590 Non-Hodgkin lymphoma, unspecified, unspecified site: Secondary | ICD-10-CM | POA: Diagnosis not present

## 2017-03-17 DIAGNOSIS — M7989 Other specified soft tissue disorders: Secondary | ICD-10-CM | POA: Diagnosis not present

## 2017-03-17 DIAGNOSIS — J9801 Acute bronchospasm: Secondary | ICD-10-CM | POA: Diagnosis not present

## 2017-03-17 LAB — PROTIME-INR
INR: 3.09
Prothrombin Time: 31.6 seconds — ABNORMAL HIGH (ref 11.4–15.2)

## 2017-03-17 LAB — PHOSPHORUS: Phosphorus: 2.8 mg/dL (ref 2.5–4.6)

## 2017-03-17 MED ORDER — WARFARIN - PHARMACIST DOSING INPATIENT
Freq: Every day | Status: DC
  Administered 2017-03-17: 18:00:00

## 2017-03-17 MED ORDER — LEVALBUTEROL HCL 1.25 MG/0.5ML IN NEBU
1.2500 mg | INHALATION_SOLUTION | Freq: Four times a day (QID) | RESPIRATORY_TRACT | Status: DC
  Administered 2017-03-17 – 2017-03-18 (×6): 1.25 mg via RESPIRATORY_TRACT
  Filled 2017-03-17 (×6): qty 0.5

## 2017-03-17 MED ORDER — ALPRAZOLAM 0.5 MG PO TABS: 0.5000 mg | ORAL_TABLET | Freq: Every evening | ORAL | Status: DC | PRN

## 2017-03-17 MED ORDER — FUROSEMIDE 10 MG/ML IJ SOLN
40.0000 mg | Freq: Once | INTRAMUSCULAR | Status: AC
  Administered 2017-03-17: 40 mg via INTRAVENOUS
  Filled 2017-03-17: qty 4

## 2017-03-17 MED ORDER — IPRATROPIUM BROMIDE 0.02 % IN SOLN
0.5000 mg | Freq: Four times a day (QID) | RESPIRATORY_TRACT | Status: DC
  Administered 2017-03-17 – 2017-03-18 (×6): 0.5 mg via RESPIRATORY_TRACT
  Filled 2017-03-17 (×6): qty 2.5

## 2017-03-17 MED ORDER — WARFARIN SODIUM 2 MG PO TABS
2.0000 mg | ORAL_TABLET | Freq: Once | ORAL | Status: AC
  Administered 2017-03-17: 2 mg via ORAL
  Filled 2017-03-17: qty 1

## 2017-03-17 MED ORDER — GUAIFENESIN ER 600 MG PO TB12
1200.0000 mg | ORAL_TABLET | Freq: Two times a day (BID) | ORAL | Status: DC
  Administered 2017-03-17 – 2017-03-18 (×3): 1200 mg via ORAL
  Filled 2017-03-17 (×3): qty 2

## 2017-03-17 MED ORDER — PREDNISONE 10 MG PO TABS
5.0000 mg | ORAL_TABLET | Freq: Two times a day (BID) | ORAL | Status: DC
  Administered 2017-03-18: 5 mg via ORAL
  Filled 2017-03-17: qty 1

## 2017-03-17 MED ORDER — MAGNESIUM SULFATE 2 GM/50ML IV SOLN
2.0000 g | Freq: Once | INTRAVENOUS | Status: AC
Start: 2017-03-17 — End: 2017-03-17
  Administered 2017-03-17: 2 g via INTRAVENOUS
  Filled 2017-03-17: qty 50

## 2017-03-17 NOTE — Care Management Obs Status (Signed)
[place] NOTIFICATION   Patient Details  Name: TRAEVON MEIRING MRN: [id number] Date of Birth: 1923-07-27   Medicare Observation Status Notification Given:  Yes    Stevin Bielinski, Chauncey Reading, RN 03/17/2017, 10:12 AM

## 2017-03-17 NOTE — Progress Notes (Signed)
PROGRESS NOTE    [name]  ZOX:[id number] DOB: 02-07-1924 DOA: 03/16/2017 PCP: Rosita Fire, MD   Brief Narrative:  83 year old male comes to the hospital with cough, wheezing and shortness of breath.  Found to have acute bronchitis.  Also complained of swelling of lower extremities.  Venous Dopplers negative for DVT.  Suspect an element of venous stasis.  Started on diuretics.  He has been treated with bronchodilators, steroids for acute bronchitis.  Recently completed a course of antibiotics.  Anticipate discharge home in the next 24 hours if he continues to improve.   Assessment & Plan:   Principal Problem:   Acute bronchitis with bronchospasm Active Problems:   Essential hypertension   ATRIAL FIBRILLATION   NHL (non-Hodgkin's lymphoma) (HCC)   Type 2 diabetes mellitus (HCC)   Hypomagnesemia   1. Acute bronchitis with bronchospasm.  Received a dose of intravenous Solu-Medrol in the emergency room.  Recently completed a course of Levaquin.  He is continued on bronchodilators.  Overall respiratory status appears to be improving. 2. Lower extremity edema.  Patient has bilateral lower extremity edema.  Venous Doppler does not show any evidence of DVT.  He had an echo done earlier this year and 10/2016 that showed normal ejection fraction.  He has no evidence of venous stasis.  Will start on low-dose Lasix. 3. Atrial fibrillation.  Continue on diltiazem 90 mg every 12 hours.  Coumadin per pharmacy. 4. Diabetes.  Continue on glipizide and metformin.  Blood sugar stable. 5. Non-Hodgkin's lymphoma.  Currently in remission.  Follow-up with cancer center.   DVT prophylaxis: Coumadin Code Status: Full code Family Communication: Discussed with family at the bedside Disposition Plan: Discharge home once improved   Consultants:     Procedures:     Antimicrobials:      Subjective: Wheezing is better.  Overall shortness of breath improving.  Objective: Vitals:   03/17/17 0915 03/17/17 0922 03/17/17 1445 03/17/17 1537  BP: (!) 163/84   (!) 146/66  Pulse: 85   92  Resp: 20   18  Temp: (!) 97 F (36.1 C)   98.2 F (36.8 C)  TempSrc: Oral   Oral  SpO2: 99% 100% 95% 98%  Weight:      Height:        Intake/Output Summary (Last 24 hours) at 03/17/2017 2003 Last data filed at 03/17/2017 1700 Gross per 24 hour  Intake 890 ml  Output 1800 ml  Net -910 ml   Filed Weights   03/16/17 1447 03/17/17 0008  Weight: 79.4 kg (175 lb) 78.9 kg (174 lb)    Examination:  General exam: Appears calm and comfortable  Respiratory system: Scattered rhonchi, no wheezes.  Respiratory effort normal. Cardiovascular system: S1 & S2 heard, RRR. No JVD, murmurs, rubs, gallops or clicks.  2+ bilateral pedal edema. Gastrointestinal system: Abdomen is nondistended, soft and nontender. No organomegaly or masses felt. Normal bowel sounds heard. Central nervous system: Alert and oriented. No focal neurological deficits. Extremities: Symmetric 5 x 5 power. Skin: No rashes, lesions or ulcers Psychiatry: Judgement and insight appear normal. Mood & affect appropriate.     Data Reviewed: I have personally reviewed following labs and imaging studies  CBC: Recent Labs  Lab 03/16/17 1924  WBC 9.7  HGB 14.3  HCT 42.2  MCV 94.0  PLT 811   Basic Metabolic Panel: Recent Labs  Lab 03/16/17 1924  NA 140  K 3.2*  CL 103  CO2 31  GLUCOSE 139*  BUN  13  CREATININE 0.65  CALCIUM 9.8  MG 1.3*  PHOS 2.8   GFR: Estimated Creatinine Clearance: 62.1 mL/min (by C-G formula based on SCr of 0.65 mg/dL). Liver Function Tests: No results for input(s): AST, ALT, ALKPHOS, BILITOT, PROT, ALBUMIN in the last 168 hours. No results for input(s): LIPASE, AMYLASE in the last 168 hours. No results for input(s): AMMONIA in the last 168 hours. Coagulation Profile: Recent Labs  Lab 03/15/17 0908 03/16/17 1924 03/17/17 0420  INR 4.8 3.69 3.09   Cardiac Enzymes: No results for  input(s): CKTOTAL, CKMB, CKMBINDEX, TROPONINI in the last 168 hours. BNP (last 3 results) No results for input(s): PROBNP in the last 8760 hours. HbA1C: No results for input(s): HGBA1C in the last 72 hours. CBG: Recent Labs  Lab 03/16/17 1903  GLUCAP 151*   Lipid Profile: No results for input(s): CHOL, HDL, LDLCALC, TRIG, CHOLHDL, LDLDIRECT in the last 72 hours. Thyroid Function Tests: No results for input(s): TSH, T4TOTAL, FREET4, T3FREE, THYROIDAB in the last 72 hours. Anemia Panel: No results for input(s): VITAMINB12, FOLATE, FERRITIN, TIBC, IRON, RETICCTPCT in the last 72 hours. Sepsis Labs: No results for input(s): PROCALCITON, LATICACIDVEN in the last 168 hours.  No results found for this or any previous visit (from the past 240 hour(s)).       Radiology Studies: Dg Chest 2 View  Result Date: 03/16/2017 CLINICAL DATA:  83 y/o  M; shortness of breath, cough, congestion. EXAM: CHEST  2 VIEW COMPARISON:  03/08/2017 chest radiograph FINDINGS: Mild cardiomegaly. Aortic atherosclerosis with calcification. Clear lungs. No pleural effusion or pneumothorax. No acute osseous abnormality is evident. Right upper quadrant cholecystectomy clips. IMPRESSION: Mild cardiomegaly.  No acute pulmonary process identified. Electronically Signed   By: Kristine Garbe M.D.   On: 03/16/2017 20:23   Ct Angio Chest Pe W And/or Wo Contrast  Result Date: 03/16/2017 CLINICAL DATA:  83 y/o M; PE suspected, intermediate probability, positive D-dimer. EXAM: CT ANGIOGRAPHY CHEST WITH CONTRAST TECHNIQUE: Multidetector CT imaging of the chest was performed using the standard protocol during bolus administration of intravenous contrast. Multiplanar CT image reconstructions and MIPs were obtained to evaluate the vascular anatomy. CONTRAST:  80 cc Isovue 370 COMPARISON:  10/05/2014 CT of the chest. FINDINGS: Cardiovascular: Mild cardiomegaly. Mild coronary artery calcification. No pericardial effusion.  Normal caliber thoracic aorta with mild calcifications. Satisfactory opacification and main pulmonary artery is, respiratory motion artifact in lung bases, no pulmonary embolus identified. Mediastinum/Nodes: Normal thyroid. No mediastinal adenopathy. Patulous esophagus. Lungs/Pleura: Diffuse peribronchial thickening and scattered mucous plugging greatest in the lung bases. No consolidation. Upper Abdomen: Cholecystectomy. Scattered calcifications throughout pancreas compatible sequelae of chronic pancreatitis. Musculoskeletal: Stable sclerosis within the superior sternum. No new acute osseous abnormality identified. Schmorl's node within the right aspect of T12 superior endplate. Review of the MIP images confirms the above findings. IMPRESSION: 1. Respiratory motion artifact.  No pulmonary embolus identified. 2. Diffuse peribronchial thickening and scattered mucous plugging greatest in lung bases compatible with bronchitis. No consolidation. 3. Patulous esophagus. 4. Mild cardiomegaly, coronary artery calcification, and aortic atherosclerosis. Electronically Signed   By: Kristine Garbe M.D.   On: 03/16/2017 21:57   US Venous Img Lower Bilateral  Result Date: 03/17/2017 CLINICAL DATA:  Bilateral lower extremity swelling and pain EXAM: BILATERAL LOWER EXTREMITY VENOUS DOPPLER ULTRASOUND TECHNIQUE: Gray-scale sonography with graded compression, as well as color Doppler and duplex ultrasound were performed to evaluate the lower extremity deep venous systems from the level of the common femoral vein and including  the common femoral, femoral, profunda femoral, popliteal and calf veins including the posterior tibial, peroneal and gastrocnemius veins when visible. The superficial great saphenous vein was also interrogated. Spectral Doppler was utilized to evaluate flow at rest and with distal augmentation maneuvers in the common femoral, femoral and popliteal veins. COMPARISON:  None. FINDINGS: RIGHT LOWER  EXTREMITY Common Femoral Vein: No evidence of thrombus. Normal compressibility, respiratory phasicity and response to augmentation. Saphenofemoral Junction: No evidence of thrombus. Normal compressibility and flow on color Doppler imaging. Profunda Femoral Vein: No evidence of thrombus. Normal compressibility and flow on color Doppler imaging. Femoral Vein: No evidence of thrombus. Normal compressibility, respiratory phasicity and response to augmentation. Popliteal Vein: No evidence of thrombus. Normal compressibility, respiratory phasicity and response to augmentation. Calf Veins: No evidence of thrombus. Normal compressibility and flow on color Doppler imaging. Superficial Great Saphenous Vein: No evidence of thrombus. Normal compressibility. Venous Reflux:  None. Other Findings: Right popliteal fossa small Baker cyst noted measuring 2.5 x 0.8 x 1.8 cm. Peripheral calf edema noted. LEFT LOWER EXTREMITY Common Femoral Vein: No evidence of thrombus. Normal compressibility, respiratory phasicity and response to augmentation. Saphenofemoral Junction: No evidence of thrombus. Normal compressibility and flow on color Doppler imaging. Profunda Femoral Vein: No evidence of thrombus. Normal compressibility and flow on color Doppler imaging. Femoral Vein: No evidence of thrombus. Normal compressibility, respiratory phasicity and response to augmentation. Popliteal Vein: No evidence of thrombus. Normal compressibility, respiratory phasicity and response to augmentation. Calf Veins: No evidence of thrombus. Normal compressibility and flow on color Doppler imaging. Superficial Great Saphenous Vein: No evidence of thrombus. Normal compressibility. Venous Reflux:  None. Other Findings:  Peripheral calf edema evident. IMPRESSION: No evidence of deep venous thrombosis. Electronically Signed   By: [name].  Shick M.D.   On: 03/17/2017 16:42        Scheduled Meds: . abiraterone acetate  1,000 mg Oral Daily  . cholecalciferol   1,000 Units Oral Daily  . diltiazem  90 mg Oral BID  . fludrocortisone  0.1 mg Oral Daily  . furosemide  40 mg Intravenous Once  . glipiZIDE  5 mg Oral Q breakfast  . guaiFENesin  1,200 mg Oral BID  . ipratropium  0.5 mg Nebulization Q6H  . levalbuterol  1.25 mg Nebulization Q6H  . lisinopril  20 mg Oral Daily  . metFORMIN  500 mg Oral BID WC  . pantoprazole  40 mg Oral Daily  . potassium chloride SA  10 mEq Oral Daily  . senna-docusate   Oral Daily  . Warfarin - Pharmacist Dosing Inpatient   Does not apply q1800   Continuous Infusions:   LOS: 0 days    Time spent: 25 minutes    Kathie Dike, MD [facility] Pager [phone]  If 7PM-7AM, please contact night-coverage [email or web address] Password [facility] 03/17/2017, 8:03 PM

## 2017-03-17 NOTE — Care Management Note (Signed)
Case Management Note  Patient Details  Name: [name] MRN: [id number] Date of Birth: 08-26-1923  Subjective/Objective: Adm with acute bronchitis. From home with wife, ind with ADL's. Has cane and RW at home if needed but does not use routinely. Has neb machine at home. Wife and daughters at bedside. Patient has PCP-Dr. Legrand Rams, still drives at times. Has insurance and reports no issues affording medications.                 Action/Plan: Anticipate DC home with self care/care of family.   Expected Discharge Date:  03/19/17               Expected Discharge Plan:  Home/Self Care  In-House Referral:  Clinical Social Work  Discharge planning Services  NA  Post Acute Care Choice:  NA Choice offered to:     DME Arranged:    DME Agency:     HH Arranged:    HH Agency:     Status of Service:  In process, will continue to follow  If discussed at Long Length of Stay Meetings, dates discussed:    Additional Comments:  [name], [name] Reading, RN 03/17/2017, 10:15 AM

## 2017-03-17 NOTE — Progress Notes (Signed)
Telemetry reported pt had a 2.38 sec pause.MD aware. Call light within reach, bed in lowest position. Will continue to monitor. Family at bedside.

## 2017-03-17 NOTE — Progress Notes (Signed)
ANTICOAGULATION CONSULT NOTE - Initial Consult  Pharmacy Consult for coumadin Indication: atrial fibrillation  No Known Allergies  Patient Measurements: Height: 5\' 10"  (177.8 cm) Weight: 174 lb (78.9 kg) IBW/kg (Calculated) : 73   Vital Signs: Temp: 97 F (36.1 C) (11/28 0915) Temp Source: Oral (11/28 0915) BP: 163/84 (11/28 0915) Pulse Rate: 85 (11/28 0915)  Labs: Recent Labs    03/15/17 0908 03/16/17 1924 03/17/17 0420  HGB  --  14.3  --   HCT  --  42.2  --   PLT  --  175  --   LABPROT  --  36.3* 31.6*  INR 4.8 3.69 3.09  CREATININE  --  0.65  --     Estimated Creatinine Clearance: 62.1 mL/min (by C-G formula based on SCr of 0.65 mg/dL).   Medical History: Past Medical History:  Diagnosis Date  . Atrial fibrillation ([place])    Dr. Harl Bowie- [facility] follows saw 11'14  . Bladder stones    tx. with oral meds and antibiotics, now surgery planned  . Cataracts, both eyes    surgery planned May 2015  . Diabetes mellitus without complication ([place])   . Family history of breast cancer   . Family history of colon cancer   . 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 Margot Chimes, thus far without recurrence.   . Hypertension   . NHL (non-Hodgkin's lymphoma) ([place]) 08/18/2011   Diffuse large B-cell lymphoma, clinically stage IIIA, CD20 positive, status post cervical lymph node biopsy 08/03/2003 on the left. PET scan was also positive in the spleen and small bowel region, but bone marrow aspiration and biopsy were negative. So he essentially had stage IIIAs. He received R-CHOP x6 cycles with CR established by PET scan criteria on 11/23/2003 with no evidence for relapse th    Medications:  Medications Prior to Admission  Medication Sig Dispense  Refill Last Dose  . acetaminophen (TYLENOL) 500 MG tablet Take 500-1,000 mg by mouth daily as needed for mild pain, fever or headache.   UNKNOWN  . Calcium Carb-Cholecalciferol 600-800 MG-UNIT TABS Take 1 capsule by mouth daily.    03/16/2017 at Unknown time  . Cholecalciferol (VITAMIN D3) 1000 units CAPS Take 1,000 Units by mouth daily.   03/16/2017 at Unknown time  . clotrimazole-betamethasone (LOTRISONE) cream Apply 1 application topically daily as needed (fungus).    unknown  . CRANBERRY PO Take 4,200 mg by mouth daily.   03/16/2017 at Unknown time  . cyanocobalamin (,VITAMIN B-12,) 1000 MCG/ML injection Inject 1,000 mcg every 30 (thirty) days into the skin.   Past Month at Unknown time  . denosumab (XGEVA) 120 MG/1.7ML SOLN injection Inject 120 mg into the skin every 30 (thirty) days.    03/03/2017 at Unknown time  . diltiazem (CARDIZEM) 90 MG tablet TAKE ONE TABLET BY MOUTH TWICE DAILY 180 tablet 2 03/16/2017 at Unknown time  . docusate sodium (COLACE) 100 MG capsule Take 100 mg by mouth daily as needed for mild constipation.   unknown  . fludrocortisone (FLORINEF) 0.1 MG tablet Take 1 tablet (0.1 mg total) daily by mouth. 30 tablet 11 03/16/2017 at Unknown time  . glipiZIDE (GLUCOTROL XL) 5 MG 24 hr tablet Take 5 mg by mouth daily with breakfast.    03/16/2017 at Unknown time  .  Leuprolide Acetate, 6 Month, (LUPRON DEPOT) 45 MG injection Inject 45 mg into the muscle every 6 (six) months.   03/03/2017 at Unknown time  . lisinopril (PRINIVIL,ZESTRIL) 20 MG tablet Take 1 tablet (20 mg total) daily by mouth. 30 tablet 11 03/16/2017 at Unknown time  . loperamide (IMODIUM) 2 MG capsule Take 2 mg by mouth as needed for diarrhea or loose stools.   unknown  . metFORMIN (GLUCOPHAGE) 500 MG tablet Take 500 mg by mouth 2 (two) times daily with a meal.    03/16/2017 at Unknown time  . nystatin (MYCOSTATIN) powder Apply topically 4 (four) times daily as needed (fungus).    unknown  . omeprazole (PRILOSEC)  20 MG capsule Take 20 mg by mouth daily.    03/16/2017 at Unknown time  . potassium chloride SA (K-DUR,KLOR-CON) 20 MEQ tablet Take 1 tablet (20 mEq total) by mouth daily. Please take 2 tablets for next weeks, then daily (Patient taking differently: Take 10 mEq by mouth daily. Please take 2 tablets for next weeks, then daily) 60 tablet 1 03/16/2017 at Unknown time  . predniSONE (DELTASONE) 5 MG tablet TAKE 1 TABLET BY MOUTH TWICE DAILY WITH MEALS 180 tablet 1 03/16/2017 at Unknown time  . sennosides-docusate sodium (SENOKOT-S) 8.6-50 MG tablet Take 1 tablet by mouth daily.   03/16/2017 at Unknown time  . VENTOLIN HFA 108 (90 Base) MCG/ACT inhaler Inhale 1-2 puffs into the lungs every 6 (six) hours as needed.    unknown  . warfarin (COUMADIN) 2.5 MG tablet Take as directed by [facility]. (Patient taking differently: Take 2.5-5 mg by mouth See admin instructions. Take 2.5mg  on Saturdays and take 5mg  on all other days) 160 tablet 0 03/14/2017 at Unknown time  . abiraterone Acetate (ZYTIGA) 250 MG tablet Take 4 tablets (1,000 mg total) by mouth daily. Take on an empty stomach 1 hour before or 2 hours after a meal 120 tablet 6 Taking  . levofloxacin (LEVAQUIN) 500 MG tablet Take 500 mg by mouth daily.    Completed Course at Unknown time    Assessment: 83 yo man to continue coumadin therapy from home.  INR this am 3.09.  Last dose of coumadin was 11/25 Goal of Therapy:  INR 2-3 Monitor platelets by anticoagulation protocol: Yes   Plan:  Coumadin 2 mg po today Daily PT/INR Monitor for bleeding complications  Antwon Rochin Poteet 03/17/2017,10:35 AM

## 2017-03-18 DIAGNOSIS — I10 Essential (primary) hypertension: Secondary | ICD-10-CM | POA: Diagnosis not present

## 2017-03-18 DIAGNOSIS — J209 Acute bronchitis, unspecified: Secondary | ICD-10-CM | POA: Diagnosis not present

## 2017-03-18 DIAGNOSIS — I4891 Unspecified atrial fibrillation: Secondary | ICD-10-CM | POA: Diagnosis not present

## 2017-03-18 DIAGNOSIS — E118 Type 2 diabetes mellitus with unspecified complications: Secondary | ICD-10-CM | POA: Diagnosis not present

## 2017-03-18 DIAGNOSIS — C8590 Non-Hodgkin lymphoma, unspecified, unspecified site: Secondary | ICD-10-CM | POA: Diagnosis not present

## 2017-03-18 DIAGNOSIS — J9801 Acute bronchospasm: Secondary | ICD-10-CM | POA: Diagnosis not present

## 2017-03-18 LAB — PROTIME-INR
INR: 2.29
Prothrombin Time: 25 seconds — ABNORMAL HIGH (ref 11.4–15.2)

## 2017-03-18 MED ORDER — FUROSEMIDE 20 MG PO TABS: 20.0000 mg | ORAL_TABLET | Freq: Every day | ORAL | 11 refills | Status: DC

## 2017-03-18 MED ORDER — GUAIFENESIN ER 600 MG PO TB12: 600.0000 mg | ORAL_TABLET | Freq: Two times a day (BID) | ORAL | 0 refills | Status: DC

## 2017-03-18 MED ORDER — PREDNISONE 5 MG PO TABS: ORAL_TABLET | ORAL | 0 refills | Status: DC

## 2017-03-18 MED ORDER — WARFARIN SODIUM 2 MG PO TABS: 4.0000 mg | ORAL_TABLET | Freq: Once | ORAL | Status: DC

## 2017-03-18 NOTE — Evaluation (Signed)
Physical Therapy Evaluation Patient Details Name: [name] MRN: [id number] DOB: 1923/10/27 Today's Date: 03/18/2017   History of Present Illness  [name] is a 83 y.o. 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 He has been following with his PCP due to having bronchitis for which he was given levofloxacin 500 mg p.o. daily for 7 days, which he just finished today.  However, he mentions in that his breathing is now getting better denies having dry cough, which is occasionally productive, associated with wheezing.  He denies fever, but complains of chills, decreased appetite and fatigue.  No rhinorrhea, no sore throat, no travel history or sick contacts to his knowledge.  He denies chest pain, palpitations, dizziness, diaphoresis, abdominal pain, nausea, emesis, diarrhea, melena or hematochezia.  He denies dysuria or hematuria.    Clinical Impression  Patient functioning at baseline for functional mobility/gait and demonstrates good return for ambulation in hallways and up/down stairs.  Patient discharged from physical therapy to care of nursing for ambulation ad lib for length of stay.    Follow Up Recommendations No PT follow up;Supervision - Intermittent    Equipment Recommendations  None recommended by PT    Recommendations for Other Services       Precautions / Restrictions Precautions Precautions: Fall Restrictions Weight Bearing Restrictions: No      Mobility  Bed Mobility Overal bed mobility: Independent                Transfers Overall transfer level: Independent                  Ambulation/Gait Ambulation/Gait assistance: Modified independent (Device/Increase  time) Ambulation Distance (Feet): 100 Feet Assistive device: None Gait Pattern/deviations: WFL(Within Functional Limits)   Gait velocity interpretation: Below normal speed for age/gender General Gait Details: grossly WFL except occasional swaying when he turns his head while talking, able to self correct without losing balance  Stairs Stairs: Yes Stairs assistance: Modified independent (Device/Increase time) Stair Management: One rail Right;Step to pattern Number of Stairs: 9 General stair comments: Patient demonstrates good return for going up/down steps without loss of balance, has to use both hands on 1 rail with mostly alternating pattern going up and step to pattern coming down  Wheelchair Mobility    Modified Rankin (Stroke Patients Only)       Balance Overall balance assessment: No apparent balance deficits (not formally assessed)                                           Pertinent Vitals/Pain Pain Assessment: No/denies pain    Home Living Family/patient expects to be discharged to:: Private residence Living Arrangements: Spouse/significant other Available Help at Discharge: Family Type of Home: House Home Access: Stairs to enter Entrance Stairs-Rails: Right;Left;Can reach both(back steps) Entrance Stairs-Number of Steps: 3 in front and 4 steps in back, 1 siderail on right side going up in front Home Layout: Multi-level Home Equipment: Walker - 2 wheels;Bedside commode;Shower seat      Prior Function Level of Independence: Independent with assistive device(s)         Comments: has been using RW for last few days secondary to sickness  Hand Dominance        Extremity/Trunk Assessment   Upper Extremity Assessment Upper Extremity Assessment: Overall WFL for tasks assessed    Lower Extremity Assessment Lower Extremity Assessment: Overall WFL for tasks assessed    Cervical / Trunk Assessment Cervical / Trunk Assessment: Normal   Communication   Communication: No difficulties  Cognition Arousal/Alertness: Awake/alert Behavior During Therapy: WFL for tasks assessed/performed Overall Cognitive Status: Within Functional Limits for tasks assessed                                        General Comments      Exercises     Assessment/Plan    PT Assessment Patent does not need any further PT services  PT Problem List         PT Treatment Interventions      PT Goals (Current goals can be found in the Care Plan section)  Acute Rehab PT Goals Patient Stated Goal: return home today PT Goal Formulation: With patient/family Time For Goal Achievement: 03/18/17 Potential to Achieve Goals: Good    Frequency     Barriers to discharge        Co-evaluation               AM-PAC PT "6 Clicks" Daily Activity  Outcome Measure Difficulty turning over in bed (including adjusting bedclothes, sheets and blankets)?: None Difficulty moving from lying on back to sitting on the side of the bed? : None Difficulty sitting down on and standing up from a chair with arms (e.g., wheelchair, bedside commode, etc,.)?: None Help needed moving to and from a bed to chair (including a wheelchair)?: None Help needed walking in hospital room?: None Help needed climbing 3-5 steps with a railing? : None 6 Click Score: 24    End of Session   Activity Tolerance: Patient tolerated treatment well Patient left: in chair;with call bell/phone within reach;with family/visitor present Nurse Communication: Mobility status PT Visit Diagnosis: Unsteadiness on feet (R26.81);Other abnormalities of gait and mobility (R26.89);Muscle weakness (generalized) (M62.81)    Time: 1501-1530 PT Time Calculation (min) (ACUTE ONLY): 29 min   Charges:   PT Evaluation $PT Eval Low Complexity: 1 Low PT Treatments $Therapeutic Activity: 23-37 mins   PT G Codes:   PT G-Codes **NOT FOR INPATIENT CLASS** Functional Assessment Tool  Used: AM-PAC 6 Clicks Basic Mobility Functional Limitation: Mobility: Walking and moving around Mobility: Walking and Moving Around Current Status (D2202): 0 percent impaired, limited or restricted Mobility: Walking and Moving Around Goal Status (R4270): 0 percent impaired, limited or restricted Mobility: Walking and Moving Around Discharge Status [phone]): 0 percent impaired, limited or restricted    3:35 PM, 03/18/17 Lonell Grandchild, MPT Physical Therapist with [facility] 336 [phone] office [phone] mobile phone

## 2017-03-18 NOTE — Progress Notes (Signed)
ANTICOAGULATION CONSULT NOTE   Pharmacy Consult for coumadin Indication: atrial fibrillation  No Known Allergies  Patient Measurements: Height: 5\' 10"  (177.8 cm) Weight: 174 lb (78.9 kg) IBW/kg (Calculated) : 73   Vital Signs: Temp: 99 F (37.2 C) (11/29 0445) Temp Source: Oral (11/29 0445) BP: 139/74 (11/29 0445) Pulse Rate: 64 (11/29 0445)  Labs: Recent Labs    03/16/17 1924 03/17/17 0420 03/18/17 0447  HGB 14.3  --   --   HCT 42.2  --   --   PLT 175  --   --   LABPROT 36.3* 31.6* 25.0*  INR 3.69 3.09 2.29  CREATININE 0.65  --   --     Estimated Creatinine Clearance: 62.1 mL/min (by C-G formula based on SCr of 0.65 mg/dL).   Medical History: Past Medical History:  Diagnosis Date  . Atrial fibrillation ([place])    Dr. Harl Bowie- [facility] follows saw 11'14  . Bladder stones    tx. with oral meds and antibiotics, now surgery planned  . Cataracts, both eyes    surgery planned May 2015  . Diabetes mellitus without complication ([place])   . Family history of breast cancer   . Family history of colon cancer   . 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 Margot Chimes, thus far without recurrence.   . Hypertension   . NHL (non-Hodgkin's lymphoma) ([place]) 08/18/2011   Diffuse large B-cell lymphoma, clinically stage IIIA, CD20 positive, status post cervical lymph node biopsy 08/03/2003 on the left. PET scan was also positive in the spleen and small bowel region, but bone marrow aspiration and biopsy were negative. So he essentially had stage IIIAs. He received R-CHOP x6 cycles with CR established by PET scan criteria on 11/23/2003 with no evidence for relapse th    Medications:  Medications Prior to Admission  Medication Sig Dispense Refill Last  Dose  . abiraterone Acetate (ZYTIGA) 250 MG tablet Take 4 tablets (1,000 mg total) by mouth daily. Take on an empty stomach 1 hour before or 2 hours after a meal 120 tablet 6 03/16/2017 at Unknown time  . acetaminophen (TYLENOL) 500 MG tablet Take 500-1,000 mg by mouth daily as needed for mild pain, fever or headache.   UNKNOWN  . Calcium Carb-Cholecalciferol 600-800 MG-UNIT TABS Take 1 capsule by mouth daily.    03/16/2017 at Unknown time  . Cholecalciferol (VITAMIN D3) 1000 units CAPS Take 1,000 Units by mouth daily.   03/16/2017 at Unknown time  . clotrimazole-betamethasone (LOTRISONE) cream Apply 1 application topically daily as needed (fungus).    unknown  . CRANBERRY PO Take 4,200 mg by mouth daily.   03/16/2017 at Unknown time  . cyanocobalamin (,VITAMIN B-12,) 1000 MCG/ML injection Inject 1,000 mcg every 30 (thirty) days into the skin.   Past Month at Unknown time  . denosumab (XGEVA) 120 MG/1.7ML SOLN injection Inject 120 mg into the skin every 30 (thirty) days.    03/03/2017 at Unknown time  . diltiazem (CARDIZEM) 90 MG tablet TAKE ONE TABLET BY MOUTH TWICE DAILY 180 tablet 2 03/16/2017 at Unknown time  . docusate sodium (COLACE) 100 MG capsule Take 100 mg by mouth daily as needed for mild constipation.   unknown  . fludrocortisone (FLORINEF) 0.1 MG tablet Take 1 tablet (0.1 mg total) daily by  mouth. 30 tablet 11 03/16/2017 at Unknown time  . glipiZIDE (GLUCOTROL XL) 5 MG 24 hr tablet Take 5 mg by mouth daily with breakfast.    03/16/2017 at Unknown time  . Leuprolide Acetate, 6 Month, (LUPRON DEPOT) 45 MG injection Inject 45 mg into the muscle every 6 (six) months.   03/03/2017 at Unknown time  . lisinopril (PRINIVIL,ZESTRIL) 20 MG tablet Take 1 tablet (20 mg total) daily by mouth. 30 tablet 11 03/16/2017 at Unknown time  . loperamide (IMODIUM) 2 MG capsule Take 2 mg by mouth as needed for diarrhea or loose stools.   unknown  . metFORMIN (GLUCOPHAGE) 500 MG tablet Take 500 mg by mouth 2  (two) times daily with a meal.    03/16/2017 at Unknown time  . nystatin (MYCOSTATIN) powder Apply topically 4 (four) times daily as needed (fungus).    unknown  . omeprazole (PRILOSEC) 20 MG capsule Take 20 mg by mouth daily.    03/16/2017 at Unknown time  . potassium chloride SA (K-DUR,KLOR-CON) 20 MEQ tablet Take 1 tablet (20 mEq total) by mouth daily. Please take 2 tablets for next weeks, then daily (Patient taking differently: Take 10 mEq by mouth daily. Please take 2 tablets for next weeks, then daily) 60 tablet 1 03/16/2017 at Unknown time  . predniSONE (DELTASONE) 5 MG tablet TAKE 1 TABLET BY MOUTH TWICE DAILY WITH MEALS 180 tablet 1 03/16/2017 at Unknown time  . sennosides-docusate sodium (SENOKOT-S) 8.6-50 MG tablet Take 1 tablet by mouth daily.   03/16/2017 at Unknown time  . VENTOLIN HFA 108 (90 Base) MCG/ACT inhaler Inhale 1-2 puffs into the lungs every 6 (six) hours as needed.    unknown  . warfarin (COUMADIN) 2.5 MG tablet Take as directed by [facility]. (Patient taking differently: Take 2.5-5 mg by mouth See admin instructions. Take 2.5mg  on Saturdays and take 5mg  on all other days) 160 tablet 0 03/14/2017 at Unknown time  . levofloxacin (LEVAQUIN) 500 MG tablet Take 500 mg by mouth daily.    Completed Course at Unknown time    Assessment: 83 yo man to continue coumadin therapy from home. His INR was elevated PTA and coumadin was held for 2 days. INR this am 2.29. Goal of Therapy:  INR 2-3 Monitor platelets by anticoagulation protocol: Yes   Plan:  Coumadin 4 mg po today Daily PT/INR Monitor for bleeding complications  Jashua Knaak Poteet 03/18/2017,8:27 AM

## 2017-03-18 NOTE — Discharge Summary (Signed)
Physician Discharge Summary  [name] MWN:[id number] DOB: 12-27-1923 DOA: 03/16/2017  PCP: Rosita Fire, MD  Admit date: 03/16/2017 Discharge date: 03/18/2017  Admitted From: Home Disposition: Home  Recommendations for Outpatient Follow-up:  1. Follow up with PCP in 1-2 weeks 2. Please obtain BMP/CBC in one week  Home Health: Equipment/Devices:  Discharge Condition: Stable CODE STATUS: Full code Diet recommendation: Heart Healthy / Carb Modified   Brief/Interim Summary: 83 year old male comes to the hospital with cough, wheezing and shortness of breath.  Found to have acute bronchitis.  Also complained of swelling of lower extremities.  Venous Dopplers negative for DVT.  Suspect an element of venous stasis.  Started on diuretics.  He has been treated with bronchodilators, steroids for acute bronchitis.  Recently completed a course of antibiotics.  Discharge Diagnoses:  Principal Problem:   Acute bronchitis with bronchospasm Active Problems:   Essential hypertension   ATRIAL FIBRILLATION   NHL (non-Hodgkin's lymphoma) (HCC)   Type 2 diabetes mellitus (HCC)   Hypomagnesemia  1. Acute bronchitis with bronchospasm.  Received a dose of intravenous Solu-Medrol in the emergency room.  Recently completed a course of Levaquin.    He was continued on bronchodilators.  Overall respiratory status has improved.  We will continue him on a prednisone taper. 2. Lower extremity edema.  Patient has bilateral lower extremity edema.  Venous Doppler does not show any evidence of DVT.  He had an echo done earlier this year and 10/2016 that showed normal ejection fraction.  Symptoms likely related to venous stasis.  He had good response to intravenous Lasix.  We will continue him on low-dose oral Lasix.  I have also recommend TED hose. 3. Atrial fibrillation.  Continue on diltiazem 90 mg every 12 hours.  Coumadin per pharmacy. 4. Diabetes.  Continue on glipizide and metformin.  Blood sugar  stable. 5. Non-Hodgkin's lymphoma.  Currently in remission.  Follow-up with cancer center.     Discharge Instructions  Discharge Instructions    Diet - low sodium heart healthy   Complete by:  As directed    Increase activity slowly   Complete by:  As directed      Allergies as of 03/18/2017   No Known Allergies     Medication List    STOP taking these medications   levofloxacin 500 MG tablet Commonly known as:  LEVAQUIN     TAKE these medications   abiraterone acetate 250 MG tablet Commonly known as:  ZYTIGA Take 4 tablets (1,000 mg total) by mouth daily. Take on an empty stomach 1 hour before or 2 hours after a meal   acetaminophen 500 MG tablet Commonly known as:  TYLENOL Take 500-1,000 mg by mouth daily as needed for mild pain, fever or headache.   Calcium Carb-Cholecalciferol 600-800 MG-UNIT Tabs Take 1 capsule by mouth daily.   clotrimazole-betamethasone cream Commonly known as:  LOTRISONE Apply 1 application topically daily as needed (fungus).   CRANBERRY PO Take 4,200 mg by mouth daily.   cyanocobalamin 1000 MCG/ML injection Commonly known as:  (VITAMIN B-12) Inject 1,000 mcg every 30 (thirty) days into the skin.   denosumab 120 MG/1.7ML Soln injection Commonly known as:  XGEVA Inject 120 mg into the skin every 30 (thirty) days.   diltiazem 90 MG tablet Commonly known as:  CARDIZEM TAKE ONE TABLET BY MOUTH TWICE DAILY   docusate sodium 100 MG capsule Commonly known as:  COLACE Take 100 mg by mouth daily as needed for mild constipation.  fludrocortisone 0.1 MG tablet Commonly known as:  FLORINEF Take 1 tablet (0.1 mg total) daily by mouth.   furosemide 20 MG tablet Commonly known as:  LASIX Take 1 tablet (20 mg total) by mouth daily.   glipiZIDE 5 MG 24 hr tablet Commonly known as:  GLUCOTROL XL Take 5 mg by mouth daily with breakfast.   guaiFENesin 600 MG 12 hr tablet Commonly known as:  MUCINEX Take 1 tablet (600 mg total) by mouth  2 (two) times daily.   lisinopril 20 MG tablet Commonly known as:  PRINIVIL,ZESTRIL Take 1 tablet (20 mg total) daily by mouth.   loperamide 2 MG capsule Commonly known as:  IMODIUM Take 2 mg by mouth as needed for diarrhea or loose stools.   LUPRON DEPOT (5-MONTH) 45 MG injection Generic drug:  Leuprolide Acetate (6 Month) Inject 45 mg into the muscle every 6 (six) months.   metFORMIN 500 MG tablet Commonly known as:  GLUCOPHAGE Take 500 mg by mouth 2 (two) times daily with a meal.   nystatin powder Commonly known as:  MYCOSTATIN/NYSTOP Apply topically 4 (four) times daily as needed (fungus).   omeprazole 20 MG capsule Commonly known as:  PRILOSEC Take 20 mg by mouth daily.   potassium chloride SA 20 MEQ tablet Commonly known as:  K-DUR,KLOR-CON Take 1 tablet (20 mEq total) by mouth daily. Please take 2 tablets for next weeks, then daily What changed:    how much to take  additional instructions   predniSONE 5 MG tablet Commonly known as:  DELTASONE Take 20mg  po bid for 2 days then 10mg  bid for 2 days then 5mg  bid What changed:    how much to take  how to take this  when to take this  additional instructions   sennosides-docusate sodium 8.6-50 MG tablet Commonly known as:  SENOKOT-S Take 1 tablet by mouth daily.   VENTOLIN HFA 108 (90 Base) MCG/ACT inhaler Generic drug:  albuterol Inhale 1-2 puffs into the lungs every 6 (six) hours as needed.   Vitamin D3 1000 units Caps Take 1,000 Units by mouth daily.   warfarin 2.5 MG tablet Commonly known as:  COUMADIN Take as directed. If you are unsure how to take this medication, talk to your nurse or doctor. Original instructions:  Take as directed by [facility]. What changed:    how much to take  how to take this  when to take this  additional instructions      Follow-up Information    Rosita Fire, MD. Schedule an appointment as soon as possible for a visit.   Specialty:  Internal  Medicine Contact information: [place] [address] [phone]          No Known Allergies  Consultations:     Procedures/Studies: Dg Chest 2 View  Result Date: 03/16/2017 CLINICAL DATA:  83 y/o  M; shortness of breath, cough, congestion. EXAM: CHEST  2 VIEW COMPARISON:  03/08/2017 chest radiograph FINDINGS: Mild cardiomegaly. Aortic atherosclerosis with calcification. Clear lungs. No pleural effusion or pneumothorax. No acute osseous abnormality is evident. Right upper quadrant cholecystectomy clips. IMPRESSION: Mild cardiomegaly.  No acute pulmonary process identified. Electronically Signed   By: Kristine Garbe M.D.   On: 03/16/2017 20:23   Dg Chest 2 View  Result Date: 03/08/2017 CLINICAL DATA:  Cough. EXAM: CHEST  2 VIEW COMPARISON:  Radiographs of August 03, 2013. FINDINGS: The heart size and mediastinal contours are within normal limits. Both lungs are clear. No pneumothorax  or pleural effusion is noted. Atherosclerosis of thoracic aorta is noted. The visualized skeletal structures are unremarkable. IMPRESSION: No active cardiopulmonary disease.  Aortic atherosclerosis. Electronically Signed   By: Marijo Conception, M.D.   On: 03/08/2017 12:29   Ct Angio Chest Pe W And/or Wo Contrast  Result Date: 03/16/2017 CLINICAL DATA:  83 y/o M; PE suspected, intermediate probability, positive D-dimer. EXAM: CT ANGIOGRAPHY CHEST WITH CONTRAST TECHNIQUE: Multidetector CT imaging of the chest was performed using the standard protocol during bolus administration of intravenous contrast. Multiplanar CT image reconstructions and MIPs were obtained to evaluate the vascular anatomy. CONTRAST:  80 cc Isovue 370 COMPARISON:  10/05/2014 CT of the chest. FINDINGS: Cardiovascular: Mild cardiomegaly. Mild coronary artery calcification. No pericardial effusion. Normal caliber thoracic aorta with mild calcifications. Satisfactory opacification and main pulmonary artery is,  respiratory motion artifact in lung bases, no pulmonary embolus identified. Mediastinum/Nodes: Normal thyroid. No mediastinal adenopathy. Patulous esophagus. Lungs/Pleura: Diffuse peribronchial thickening and scattered mucous plugging greatest in the lung bases. No consolidation. Upper Abdomen: Cholecystectomy. Scattered calcifications throughout pancreas compatible sequelae of chronic pancreatitis. Musculoskeletal: Stable sclerosis within the superior sternum. No new acute osseous abnormality identified. Schmorl's node within the right aspect of T12 superior endplate. Review of the MIP images confirms the above findings. IMPRESSION: 1. Respiratory motion artifact.  No pulmonary embolus identified. 2. Diffuse peribronchial thickening and scattered mucous plugging greatest in lung bases compatible with bronchitis. No consolidation. 3. Patulous esophagus. 4. Mild cardiomegaly, coronary artery calcification, and aortic atherosclerosis. Electronically Signed   By: Kristine Garbe M.D.   On: 03/16/2017 21:57   US Venous Img Lower Bilateral  Result Date: 03/17/2017 CLINICAL DATA:  Bilateral lower extremity swelling and pain EXAM: BILATERAL LOWER EXTREMITY VENOUS DOPPLER ULTRASOUND TECHNIQUE: Gray-scale sonography with graded compression, as well as color Doppler and duplex ultrasound were performed to evaluate the lower extremity deep venous systems from the level of the common femoral vein and including the common femoral, femoral, profunda femoral, popliteal and calf veins including the posterior tibial, peroneal and gastrocnemius veins when visible. The superficial great saphenous vein was also interrogated. Spectral Doppler was utilized to evaluate flow at rest and with distal augmentation maneuvers in the common femoral, femoral and popliteal veins. COMPARISON:  None. FINDINGS: RIGHT LOWER EXTREMITY Common Femoral Vein: No evidence of thrombus. Normal compressibility, respiratory phasicity and response  to augmentation. Saphenofemoral Junction: No evidence of thrombus. Normal compressibility and flow on color Doppler imaging. Profunda Femoral Vein: No evidence of thrombus. Normal compressibility and flow on color Doppler imaging. Femoral Vein: No evidence of thrombus. Normal compressibility, respiratory phasicity and response to augmentation. Popliteal Vein: No evidence of thrombus. Normal compressibility, respiratory phasicity and response to augmentation. Calf Veins: No evidence of thrombus. Normal compressibility and flow on color Doppler imaging. Superficial Great Saphenous Vein: No evidence of thrombus. Normal compressibility. Venous Reflux:  None. Other Findings: Right popliteal fossa small Baker cyst noted measuring 2.5 x 0.8 x 1.8 cm. Peripheral calf edema noted. LEFT LOWER EXTREMITY Common Femoral Vein: No evidence of thrombus. Normal compressibility, respiratory phasicity and response to augmentation. Saphenofemoral Junction: No evidence of thrombus. Normal compressibility and flow on color Doppler imaging. Profunda Femoral Vein: No evidence of thrombus. Normal compressibility and flow on color Doppler imaging. Femoral Vein: No evidence of thrombus. Normal compressibility, respiratory phasicity and response to augmentation. Popliteal Vein: No evidence of thrombus. Normal compressibility, respiratory phasicity and response to augmentation. Calf Veins: No evidence of thrombus. Normal compressibility and flow on color Doppler  imaging. Superficial Great Saphenous Vein: No evidence of thrombus. Normal compressibility. Venous Reflux:  None. Other Findings:  Peripheral calf edema evident. IMPRESSION: No evidence of deep venous thrombosis. Electronically Signed   By: [name].  Shick M.D.   On: 03/17/2017 16:42     Subjective: Feeling better.  Shortness of breath has improved.  Has a mild cough.  Feels that lower extremity edema is better after Lasix.  Discharge Exam: Vitals:   03/18/17 1300 03/18/17 1454  BP:  (!) 151/64   Pulse: 94   Resp: 16   Temp: (!) 97.5 F (36.4 C)   SpO2: 97% 99%   Vitals:   03/18/17 0445 03/18/17 0802 03/18/17 1300 03/18/17 1454  BP: 139/74  (!) 151/64   Pulse: 64  94   Resp: 16  16   Temp: 99 F (37.2 C)  (!) 97.5 F (36.4 C)   TempSrc: Oral  Oral   SpO2: 95% 96% 97% 99%  Weight:      Height:        General: Pt is alert, awake, not in acute distress Cardiovascular: RRR, S1/S2 +, no rubs, no gallops Respiratory: CTA bilaterally, no wheezing, no rhonchi Abdominal: Soft, NT, ND, bowel sounds + Extremities: 1+ edema bilaterally, no cyanosis    The results of significant diagnostics from this hospitalization (including imaging, microbiology, ancillary and laboratory) are listed below for reference.     Microbiology: No results found for this or any previous visit (from the past 240 hour(s)).   Labs: BNP (last 3 results) Recent Labs    03/16/17 1925  BNP 509.3*   Basic Metabolic Panel: Recent Labs  Lab 03/16/17 1924  NA 140  K 3.2*  CL 103  CO2 31  GLUCOSE 139*  BUN 13  CREATININE 0.65  CALCIUM 9.8  MG 1.3*  PHOS 2.8   Liver Function Tests: No results for input(s): AST, ALT, ALKPHOS, BILITOT, PROT, ALBUMIN in the last 168 hours. No results for input(s): LIPASE, AMYLASE in the last 168 hours. No results for input(s): AMMONIA in the last 168 hours. CBC: Recent Labs  Lab 03/16/17 1924  WBC 9.7  HGB 14.3  HCT 42.2  MCV 94.0  PLT 175   Cardiac Enzymes: No results for input(s): CKTOTAL, CKMB, CKMBINDEX, TROPONINI in the last 168 hours. BNP: Invalid input(s): POCBNP CBG: Recent Labs  Lab 03/16/17 1903  GLUCAP 151*   D-Dimer Recent Labs    03/16/17 1924  DDIMER 1.12*   Hgb A1c No results for input(s): HGBA1C in the last 72 hours. Lipid Profile No results for input(s): CHOL, HDL, LDLCALC, TRIG, CHOLHDL, LDLDIRECT in the last 72 hours. Thyroid function studies No results for input(s): TSH, T4TOTAL, T3FREE, THYROIDAB in  the last 72 hours.  Invalid input(s): FREET3 Anemia work up No results for input(s): VITAMINB12, FOLATE, FERRITIN, TIBC, IRON, RETICCTPCT in the last 72 hours. Urinalysis No results found for: COLORURINE, APPEARANCEUR, LABSPEC, [place], GLUCOSEU, HGBUR, BILIRUBINUR, KETONESUR, PROTEINUR, UROBILINOGEN, NITRITE, LEUKOCYTESUR Sepsis Labs Invalid input(s): PROCALCITONIN,  WBC,  LACTICIDVEN Microbiology No results found for this or any previous visit (from the past 240 hour(s)).   Time coordinating discharge: Over 30 minutes  SIGNED:   Kathie Dike, MD  [facility] 03/18/2017, 8:07 PM Pager   If 7PM-7AM, please contact night-coverage [email or web address] Password [email or web address]

## 2017-03-18 NOTE — Progress Notes (Signed)
Discharge instructions given, verbalized understanding, out in stable condition via w/c with staff. 

## 2017-03-22 ENCOUNTER — Other Ambulatory Visit (HOSPITAL_COMMUNITY): Payer: Self-pay | Admitting: *Deleted

## 2017-03-22 ENCOUNTER — Telehealth: Payer: Self-pay | Admitting: Cardiology

## 2017-03-22 ENCOUNTER — Telehealth: Payer: Self-pay | Admitting: Neurology

## 2017-03-22 MED ORDER — POTASSIUM CHLORIDE CRYS ER 10 MEQ PO TBCR: EXTENDED_RELEASE_TABLET | ORAL | 1 refills | Status: DC

## 2017-03-22 NOTE — Telephone Encounter (Signed)
Called pt's daughter. Unable to leave voicemail. Placed papers dropped off on Dr. Nelly Laurence desk.

## 2017-03-22 NOTE — Telephone Encounter (Signed)
Patient's daughter presented to the lobby stating patient was recently hospitalized and there is possible interactions with medications. She would like a call back regarding diuretics. [phone]

## 2017-03-22 NOTE — Telephone Encounter (Signed)
Please give pt's daughter [name] a call @ [phone] concerning the pt's med changes from a recent ER visit-- dropped off paper work. Pt was seen to leg swelling.

## 2017-03-22 NOTE — Progress Notes (Signed)
Pt's daughter came by the clinic asking about the pt's potassium and how he was supposed to take the medication. According to the note it was unclear how long he was to take 109mEq of potassium. Spoke with Dr. Talbert Cage and she advised the pt to take 68mEq of potassium for the next 2 weeks until he is seen again here at the clinic. The daughter also asked if we could give him smaller pills, Dr. Talbert Cage gave verbal order for 76mEq tablets , take four tablets a day for 2 weeks. Medication was sent in to the pt's pharmacy and daughter is aware and verblaized understanding.

## 2017-03-22 NOTE — Telephone Encounter (Signed)
Left voice mail on phone for daughter [name].  Told her to have pt hold coumadin tonight take 1 tablet tomorrow night then resume 2 tablets daily except 1 tablet on Saturdays.  He was in [facility] from 11/27 - 11/29 for acute bronchitis.  He had finished a round of levaquin.  He was sent home on a prednisone taper (40mg  bid x 2 days, 20mg  bid x 2 days and 10mg  bid x 2.5 days).  Will finish 12/5   D/C INR was 2.29.

## 2017-03-22 NOTE — Telephone Encounter (Signed)
Please give pt's daughter a call--pt was just recently admitted, stated it was high in the hospital, still taking same dose you gave him on the 28th

## 2017-03-22 NOTE — Telephone Encounter (Signed)
Spoke with dtr. who sts. pt. is now on Lasix for edema lower extremities. Wants to know if he should stop the Fludrocortisone.  Per RAS, I have explained that if presyncope is improved, he should continue Fludrocortisone.  If no improvement, he may stop it.  She verbalized understanding of same, asked about changes to  Lisinopril. I have explained that changes to that med would need to come from cardiology.Hilton Cork

## 2017-03-23 DIAGNOSIS — E119 Type 2 diabetes mellitus without complications: Secondary | ICD-10-CM | POA: Diagnosis not present

## 2017-03-23 DIAGNOSIS — I10 Essential (primary) hypertension: Secondary | ICD-10-CM | POA: Diagnosis not present

## 2017-03-23 DIAGNOSIS — E1142 Type 2 diabetes mellitus with diabetic polyneuropathy: Secondary | ICD-10-CM | POA: Diagnosis not present

## 2017-03-23 DIAGNOSIS — I482 Chronic atrial fibrillation: Secondary | ICD-10-CM | POA: Diagnosis not present

## 2017-03-23 DIAGNOSIS — R600 Localized edema: Secondary | ICD-10-CM | POA: Diagnosis not present

## 2017-03-23 DIAGNOSIS — D649 Anemia, unspecified: Secondary | ICD-10-CM | POA: Diagnosis not present

## 2017-03-23 DIAGNOSIS — M069 Rheumatoid arthritis, unspecified: Secondary | ICD-10-CM | POA: Diagnosis not present

## 2017-03-23 DIAGNOSIS — B351 Tinea unguium: Secondary | ICD-10-CM | POA: Diagnosis not present

## 2017-03-23 DIAGNOSIS — E1165 Type 2 diabetes mellitus with hyperglycemia: Secondary | ICD-10-CM | POA: Diagnosis not present

## 2017-03-23 DIAGNOSIS — M549 Dorsalgia, unspecified: Secondary | ICD-10-CM | POA: Diagnosis not present

## 2017-03-23 DIAGNOSIS — N390 Urinary tract infection, site not specified: Secondary | ICD-10-CM | POA: Diagnosis not present

## 2017-03-23 DIAGNOSIS — J209 Acute bronchitis, unspecified: Secondary | ICD-10-CM | POA: Diagnosis not present

## 2017-03-23 DIAGNOSIS — I2510 Atherosclerotic heart disease of native coronary artery without angina pectoris: Secondary | ICD-10-CM | POA: Diagnosis not present

## 2017-03-24 ENCOUNTER — Telehealth (HOSPITAL_COMMUNITY): Payer: Self-pay

## 2017-03-24 NOTE — Telephone Encounter (Signed)
Patients daughter, [name], called with some questions and concerns and was requesting some advice. She states her dad recently got out of the hospital due to peripheral edema and bronchitis. He is recovering slowly. His 81 year old wife is sick also. They usually go to [place] in January. The daughter states if patient can get healthy enough they still plan on going. He is scheduled for a nerve conduction study this month and if postponed it will be January before it can be rescheduled. This will interfere with his [place] trip.The daughter states his problems started when he saw the neurologist and was placed on a new medicine. He could not take his diuretic with this medicine per the neurologist. Once he was hospitalized, the PCP said to stop the new med and placed him back on diuretic. Also the neurologist said to start B12 injections. Reviewed with provider, explained to daughter that the B12 is not going to hurt him and may actually help him feel better and have more energy. As for the nerve conduction, I explained it would probably be better to postpone this. Her dad is 80 and once he gets back to his baseline let him go to [place] and enjoy this trip. They will be back in March baring no unforseen events. Suggested when he gets back to follow up with PCP and go from there. She needs to be sure the patient is careful about starting new meds. She may want to review with his PCP if another doctor wants to start something new.Daughter verbalized understanding and expressed thanks.

## 2017-03-26 DIAGNOSIS — D649 Anemia, unspecified: Secondary | ICD-10-CM | POA: Diagnosis not present

## 2017-03-29 ENCOUNTER — Telehealth: Payer: Self-pay | Admitting: *Deleted

## 2017-03-29 NOTE — Telephone Encounter (Signed)
Spoke with dtr. [name] and explained that our office will be closed tomorrow.  Someone from the office should be calling them to r/s his emg/ncv once the office reopens.  If they have not heard from our office by Friday, they should call us to r/s.  She verbalized understanding of same/fim

## 2017-03-30 ENCOUNTER — Encounter: Payer: Medicare Other | Admitting: Neurology

## 2017-04-01 ENCOUNTER — Telehealth (HOSPITAL_COMMUNITY): Payer: Self-pay | Admitting: Adult Health

## 2017-04-01 DIAGNOSIS — H26492 Other secondary cataract, left eye: Secondary | ICD-10-CM | POA: Diagnosis not present

## 2017-04-01 DIAGNOSIS — H35371 Puckering of macula, right eye: Secondary | ICD-10-CM | POA: Diagnosis not present

## 2017-04-01 NOTE — Telephone Encounter (Signed)
Faxed j and j app for zytiga assist

## 2017-04-02 ENCOUNTER — Ambulatory Visit (INDEPENDENT_AMBULATORY_CARE_PROVIDER_SITE_OTHER): Payer: Medicare Other | Admitting: *Deleted

## 2017-04-02 DIAGNOSIS — D510 Vitamin B12 deficiency anemia due to intrinsic factor deficiency: Secondary | ICD-10-CM | POA: Diagnosis not present

## 2017-04-02 DIAGNOSIS — I4891 Unspecified atrial fibrillation: Secondary | ICD-10-CM | POA: Diagnosis not present

## 2017-04-02 DIAGNOSIS — Z5181 Encounter for therapeutic drug level monitoring: Secondary | ICD-10-CM

## 2017-04-02 DIAGNOSIS — I480 Paroxysmal atrial fibrillation: Secondary | ICD-10-CM

## 2017-04-02 LAB — POCT INR: INR: 3

## 2017-04-06 ENCOUNTER — Ambulatory Visit (HOSPITAL_COMMUNITY): Payer: Medicare Other | Admitting: Adult Health

## 2017-04-06 ENCOUNTER — Encounter (HOSPITAL_COMMUNITY): Payer: Self-pay | Admitting: Adult Health

## 2017-04-06 ENCOUNTER — Encounter (HOSPITAL_BASED_OUTPATIENT_CLINIC_OR_DEPARTMENT_OTHER): Payer: Medicare Other

## 2017-04-06 ENCOUNTER — Encounter (HOSPITAL_COMMUNITY): Payer: Medicare Other | Attending: Adult Health | Admitting: Adult Health

## 2017-04-06 ENCOUNTER — Encounter (HOSPITAL_COMMUNITY): Payer: Medicare Other | Attending: Oncology

## 2017-04-06 ENCOUNTER — Other Ambulatory Visit: Payer: Self-pay

## 2017-04-06 VITALS — BP 117/60 | HR 76 | Temp 97.6°F | Resp 18 | Ht 70.0 in | Wt 163.5 lb

## 2017-04-06 DIAGNOSIS — C49A0 Gastrointestinal stromal tumor, unspecified site: Secondary | ICD-10-CM | POA: Insufficient documentation

## 2017-04-06 DIAGNOSIS — C7951 Secondary malignant neoplasm of bone: Secondary | ICD-10-CM

## 2017-04-06 DIAGNOSIS — C8590 Non-Hodgkin lymphoma, unspecified, unspecified site: Secondary | ICD-10-CM | POA: Diagnosis not present

## 2017-04-06 DIAGNOSIS — D472 Monoclonal gammopathy: Secondary | ICD-10-CM | POA: Diagnosis not present

## 2017-04-06 DIAGNOSIS — C61 Malignant neoplasm of prostate: Secondary | ICD-10-CM

## 2017-04-06 LAB — CBC WITH DIFFERENTIAL/PLATELET
Basophils Absolute: 0 10*3/uL (ref 0.0–0.1)
Basophils Relative: 0 %
Eosinophils Absolute: 0 10*3/uL (ref 0.0–0.7)
Eosinophils Relative: 0 %
HCT: 43.2 % (ref 39.0–52.0)
Hemoglobin: 15.2 g/dL (ref 13.0–17.0)
Lymphocytes Relative: 15 %
Lymphs Abs: 1.9 10*3/uL (ref 0.7–4.0)
MCH: 32.5 pg (ref 26.0–34.0)
MCHC: 35.2 g/dL (ref 30.0–36.0)
MCV: 92.5 fL (ref 78.0–100.0)
Monocytes Absolute: 1.1 10*3/uL — ABNORMAL HIGH (ref 0.1–1.0)
Monocytes Relative: 9 %
Neutro Abs: 9.6 10*3/uL — ABNORMAL HIGH (ref 1.7–7.7)
Neutrophils Relative %: 76 %
Platelets: 157 10*3/uL (ref 150–400)
RBC: 4.67 MIL/uL (ref 4.22–5.81)
RDW: 13.6 % (ref 11.5–15.5)
WBC: 12.5 10*3/uL — ABNORMAL HIGH (ref 4.0–10.5)

## 2017-04-06 LAB — COMPREHENSIVE METABOLIC PANEL
ALT: 17 U/L (ref 17–63)
AST: 22 U/L (ref 15–41)
Albumin: 3.9 g/dL (ref 3.5–5.0)
Alkaline Phosphatase: 71 U/L (ref 38–126)
Anion gap: 15 (ref 5–15)
BUN: 18 mg/dL (ref 6–20)
CO2: 24 mmol/L (ref 22–32)
Calcium: 9.3 mg/dL (ref 8.9–10.3)
Chloride: 96 mmol/L — ABNORMAL LOW (ref 101–111)
Creatinine, Ser: 0.96 mg/dL (ref 0.61–1.24)
GFR calc Af Amer: >60 mL/min (ref >60)
GFR calc non Af Amer: >60 mL/min (ref >60)
Glucose, Bld: 299 mg/dL — ABNORMAL HIGH (ref 65–99)
Potassium: 3.9 mmol/L (ref 3.5–5.1)
Sodium: 135 mmol/L (ref 135–145)
Total Bilirubin: 1.2 mg/dL (ref 0.3–1.2)
Total Protein: 6.8 g/dL (ref 6.5–8.1)

## 2017-04-06 LAB — PSA: Prostatic Specific Antigen: 0.03 ng/mL (ref 0.00–4.00)

## 2017-04-06 LAB — LACTATE DEHYDROGENASE: LDH: 157 U/L (ref 98–192)

## 2017-04-06 MED ORDER — DENOSUMAB 120 MG/1.7ML ~~LOC~~ SOLN
120.0000 mg | Freq: Once | SUBCUTANEOUS | Status: AC
  Administered 2017-04-06: 120 mg via SUBCUTANEOUS
  Filled 2017-04-06: qty 1.7

## 2017-04-06 NOTE — Patient Instructions (Signed)
[name] at [facility] Discharge Instructions  RECOMMENDATIONS MADE BY THE CONSULTANT AND ANY TEST RESULTS WILL BE SENT TO YOUR REFERRING PHYSICIAN.  You were seen today by Mike Craze NP. Increase your calcium and vit D while you are gone. Return in mid April for follow up, xgeva, and labs.    Thank you for choosing [place] [facility] to provide your oncology and hematology care.  To afford each patient quality time with our provider, please arrive at least 15 minutes before your scheduled appointment time.    If you have a lab appointment with the [facility] please come in thru the  Main Entrance and check in at the main information desk  You need to re-schedule your appointment should you arrive 10 or more minutes late.  We strive to give you quality time with our providers, and arriving late affects you and other patients whose appointments are after yours.  Also, if you no show three or more times for appointments you may be dismissed from the clinic at the providers discretion.     Again, thank you for choosing [facility].  Our hope is that these requests will decrease the amount of time that you wait before being seen by our physicians.       _____________________________________________________________  Should you have questions after your visit to [facility], please contact our office at [phone] between the hours of 8:30 a.m. and 4:30 p.m.  Voicemails left after 4:30 p.m. will not be returned until the following business day.  For prescription refill requests, have your pharmacy contact our office.       Resources For Cancer Patients and their Caregivers ? American Cancer Society: Can assist with transportation, wigs, general needs, runs Look Good Feel Better.        256-613-6306 ? Cancer Care: Provides financial assistance, online support groups, medication/co-pay assistance.   1-800-813-HOPE 787-365-5438) ? [employer] Assists [place] cancer patients and their families through emotional , educational and financial support.  2795618699 ? [place] DSS Where to apply for food stamps, Medicaid and utility assistance. (708)486-0540 ? [employer]: Transportation to medical appointments. 207-871-6483 ? Social Security Administration: May apply for disability if have a Stage IV cancer. (628)387-2525 (760) 135-1350 ? [employer], Disability and Transit Services: Assists with nutrition, care and transit needs. [place] Support Programs: @10RELATIVEDAYS @ > Cancer Support Group  2nd Tuesday of the month 1pm-2pm, Journey Room  > Creative Journey  3rd Tuesday of the month 1130am-1pm, Journey Room  > Look Good Feel Better  1st Wednesday of the month 10am-12 noon, Journey Room (Call [facility] to register [phone])

## 2017-04-06 NOTE — Progress Notes (Signed)
[name] presents today for injection per MD orders. Xgeva 120 mg  administered SQ in right Abdomen. Administration without incident. Patient tolerated well.  Treatment given per orders. Patient tolerated it well without problems. Vitals stable and discharged home from clinic ambulatory. Follow up as scheduled.

## 2017-04-06 NOTE — Progress Notes (Signed)
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 male presents for routine follow-up for several malignancies including metastatic prostate cancer, and history of DLBCL and GIST of small bowel.   Chart reviewed. Since his last visit to the cancer center, he was hospitalized from 03/16/17-03/18/17 for bronchitis; treated with bronchodilators and steroids (had recently completed course of Levaquin). He also reportedly c/o swelling of BLE and doppler US was negative for DVT bilaterally. He was given diuretics with Lasix.    Here today with his daughter.   Overall, he tells me he has been feeling pretty good.  Appetite 100%; energy levels 75%.  Denies any pain.  He has intermittent fatigue and dizziness, which has been longstanding.  He has peripheral neuropathy to his feet. Dizziness is worse particularly with standing; he has undergone neurology eval; they are planning on doing nerve conduction study for his neuropathy.    Since he was discharged from the hospital for bronchitis, his cough and shortness of breath have resolved.   His daughter has several concerns re: his medications.  "When he was taking the lisinopril/HCTZ medication, he did not have any leg swelling. Now that he started the Lasix, he has more swelling.  I want him to get straightened out like he was before he was in the hospital before they leave to go to [place]."  Also concerned if he needs to continue "doubling up" his potassium supplements.   His daughter also has questions about recently diagnosed MGUS and would like me to review this with her today.   Remains on Zytiga/prednisone as prescribed. Denies any missed doses.   He and his wife are planning their annual temporary relocation to [place] for early January.      REVIEW OF SYSTEMS:  Review of Systems  Constitutional: Positive for fatigue.  Respiratory: Negative.  Negative for cough and shortness of breath.   Cardiovascular: Positive for leg swelling.  Gastrointestinal: Negative.   Endocrine: Negative.   Genitourinary: Negative.    Skin: Negative.   Neurological: Positive for dizziness, extremity weakness and numbness.  Hematological: Bruises/bleeds easily.  Psychiatric/Behavioral: Negative.      PAST MEDICAL/SURGICAL HISTORY:  Past Medical History:  Diagnosis Date  . Atrial fibrillation ([place])    Dr. Harl Bowie- [facility] follows saw 11'14  . Bladder stones    tx. with oral meds and antibiotics, now surgery planned  . Cataracts, both eyes    surgery planned May 2015  . Diabetes mellitus without complication ([place])   . Family history of breast cancer   . Family history of colon cancer   . GIST (gastrointestinal stromal tumor), malignant ([place]) 08/18/2011   Gastrointestinal stromal tumor, that is GIST, small bowel, 4.5 cm, intermediate prognostic grade found on the PET scan in the small bowel, accounting for that small bowel activity in October 2005 with resection by Dr. Margot Chimes, thus far without recurrence.   . Hypertension   . NHL (non-Hodgkin's lymphoma) ([place]) 08/18/2011   Diffuse large B-cell lymphoma, clinically stage IIIA,  CD20 positive, status post cervical lymph node biopsy 08/03/2003 on the left. PET scan was also positive in the spleen and small bowel region, but bone marrow aspiration and biopsy were negative. So he essentially had stage IIIAs. He received R-CHOP x6 cycles with CR established by PET scan criteria on 11/23/2003 with no evidence for relapse th    Past Surgical History:  Procedure Laterality Date  . CATARACT EXTRACTION Right 08/2013  . CHOLECYSTECTOMY    . CYSTOSCOPY WITH LITHOLAPAXY N/A 08/10/2013   Procedure: CYSTOSCOPY WITH LITHOLAPAXY WITH [name];  Surgeon: [name], MD;  Location: [facility];  Service: Urology;  Laterality: N/A;  . ESOPHAGEAL DILATION N/A 02/27/2016   Procedure: ESOPHAGEAL DILATION;  Surgeon: [name], MD;  Location: [facility];  Service: Endoscopy;  Laterality: N/A;  . ESOPHAGOGASTRODUODENOSCOPY N/A 02/27/2016   Procedure: ESOPHAGOGASTRODUODENOSCOPY (EGD);  Surgeon: [name], MD;  Location: [facility];  Service: Endoscopy;  Laterality: N/A;  12:00  . INCISION AND DRAINAGE PERIRECTAL ABSCESS    . LYMPH NODE DISSECTION Left    '05  . PORT-A-CATH REMOVAL    . PORTACATH PLACEMENT     insertion and removal -last chemotherapy 10 yrs ago  . TRANSURETHRAL RESECTION OF PROSTATE N/A 08/10/2013   Procedure: TRANSURETHRAL RESECTION OF THE PROSTATE WITH GYRUS INSTRUMENTS;  Surgeon: [name], MD;  Location: [facility];  Service: Urology;  Laterality: N/A;     SOCIAL HISTORY:  Social History   Socioeconomic History  . Marital status: Married    Spouse name: Not on file  . Number of children: Not on file  . Years of education: Not on file  . Highest education level: Not on file  Social Needs  . Financial resource strain: Not on file  . Food insecurity - worry: Not on file  . Food insecurity - inability: Not on file  . Transportation needs - medical: Not on file  . Transportation needs - non-medical: Not on file  Occupational History  . Occupation: retired    Fish farm manager: RETIRED    Comment: salesman  Tobacco Use  . Smoking status: Never Smoker  . Smokeless tobacco: Never Used  Substance and Sexual Activity  . Alcohol use: No  . Drug use: No  . Sexual activity: Not Currently  Other Topics Concern  . Not on file  Social History Narrative  . Not on file    FAMILY HISTORY:   Family History  Problem Relation Age of Onset  . Heart attack Mother        died in her 78s  . Other Father 66       pneumonia - died  . Breast cancer Sister        dx in her 32s-60s  . Colon cancer Brother        dx in his 66s  . Cancer Sister        TAH/BSO due to "male cancer"  . Breast cancer Daughter 52  . Breast cancer Daughter 97       DCIS - ATM VUS on [employer]ae 83 gene panel in 2018  . Breast cancer Daughter 27       DCIS - Negative on My[employer] MyRisk 25 gene apnel in 2015  . Thyroid cancer Daughter 21       Medullary and Papillary  . Thyroid nodules Grandchild     CURRENT MEDICATIONS:  Outpatient Encounter Medications as of 04/06/2017  Medication Sig Note  . abiraterone Acetate (ZYTIGA) 250 MG tablet Take  4 tablets (1,000 mg total) by mouth daily. Take on an empty stomach 1 hour before or 2 hours after a meal   . acetaminophen (TYLENOL) 500 MG tablet Take 500-1,000 mg by mouth daily as needed for mild pain, fever or headache.   . Calcium Carb-Cholecalciferol 600-800 MG-UNIT TABS Take 1 capsule by mouth daily.    . Cholecalciferol (VITAMIN D3) 1000 units CAPS Take 1,000 Units by mouth daily.   . clotrimazole-betamethasone (LOTRISONE) cream Apply 1 application topically daily as needed (fungus).    . CRANBERRY PO Take 4,200 mg by mouth daily.   . cyanocobalamin (,VITAMIN B-12,) 1000 MCG/ML injection Inject 1,000 mcg every 30 (thirty) days into the skin.   Marland Kitchen denosumab (XGEVA) 120 MG/1.7ML SOLN injection Inject 120 mg into the skin every 30 (thirty) days.    Marland Kitchen diltiazem (CARDIZEM) 90 MG tablet TAKE ONE TABLET BY MOUTH TWICE DAILY   . docusate sodium (COLACE) 100 MG capsule Take 100 mg by mouth daily as needed for mild constipation.   . fludrocortisone (FLORINEF) 0.1 MG tablet Take 1 tablet (0.1 mg total) daily by mouth.   . furosemide (LASIX) 20 MG tablet Take 1 tablet (20 mg total) by mouth daily.   Marland Kitchen glipiZIDE (GLUCOTROL XL) 5 MG 24 hr tablet Take 5 mg by mouth daily with  breakfast.    . guaiFENesin (MUCINEX) 600 MG 12 hr tablet Take 1 tablet (600 mg total) by mouth 2 (two) times daily.   Marland Kitchen Leuprolide Acetate, 6 Month, (LUPRON DEPOT) 45 MG injection Inject 45 mg into the muscle every 6 (six) months. 03/16/2017: Received on 03/03/2017  . lisinopril (PRINIVIL,ZESTRIL) 20 MG tablet Take 1 tablet (20 mg total) daily by mouth.   . loperamide (IMODIUM) 2 MG capsule Take 2 mg by mouth as needed for diarrhea or loose stools.   . metFORMIN (GLUCOPHAGE) 500 MG tablet Take 500 mg by mouth 2 (two) times daily with a meal.    . metFORMIN (GLUCOPHAGE) 850 MG tablet    . nystatin (MYCOSTATIN) powder Apply topically 4 (four) times daily as needed (fungus).    Marland Kitchen omeprazole (PRILOSEC) 20 MG capsule Take 20 mg by mouth daily.    . potassium chloride SA (K-DUR,KLOR-CON) 10 MEQ tablet Take 4 tablets daily for 2 weeks until seen at Cancer center   . predniSONE (DELTASONE) 5 MG tablet Take '20mg'$  po bid for 2 days then '10mg'$  bid for 2 days then '5mg'$  bid   . sennosides-docusate sodium (SENOKOT-S) 8.6-50 MG tablet Take 1 tablet by mouth daily.   . VENTOLIN HFA 108 (90 Base) MCG/ACT inhaler Inhale 1-2 puffs into the lungs every 6 (six) hours as needed.    . warfarin (COUMADIN) 2.5 MG tablet Take as directed by Coumadin Clinic. (Patient taking differently: Take 2.5-5 mg by mouth See admin instructions. Take 2.'5mg'$  on Saturdays and take '5mg'$  on all other days) 03/16/2017: HOLD DOSE 03/15/2017-03/16/2017   No facility-administered encounter medications on file as of 04/06/2017.     ALLERGIES:  No Known Allergies   PHYSICAL EXAM:  ECOG Performance status: 1-2 - Symptomatic; requires occasional assistance, but is mostly independent.   Vitals:   04/06/17 1451  BP: 117/60  Pulse: 76  Resp: 18  Temp: 97.6 F (36.4 C)  SpO2: 100%   Filed Weights   04/06/17 1451  Weight: 163 lb 8 oz (74.2 kg)    Physical Exam  Constitutional: He is oriented to person, place, and time and  well-developed, well-nourished, and in no distress.  HENT:  Head: Normocephalic.  Mouth/Throat: Oropharynx is clear and moist. No oropharyngeal exudate.  Eyes: Conjunctivae are normal. Pupils are equal, round, and reactive to light. No scleral icterus.  Neck: Normal range of motion. Neck supple.  Cardiovascular: Normal rate and regular rhythm.  Pulmonary/Chest: Effort normal. No respiratory distress. He has no wheezes.  Abdominal: Soft. Bowel sounds are normal. There is no tenderness.  Musculoskeletal: Normal range of motion. He exhibits edema (Trace BLE/ankle edema).  Lymphadenopathy:    He has no cervical adenopathy.       Right: No supraclavicular adenopathy present.       Left: No supraclavicular adenopathy present.  Neurological: He is alert and oriented to person, place, and time. No cranial nerve deficit. Gait normal.  Skin: Skin is warm and dry. No rash noted.  Psychiatric: Mood, memory, affect and judgment normal.  Nursing note and vitals reviewed.    LABORATORY DATA:  I have reviewed the labs as listed.  CBC    Component Value Date/Time   WBC 12.5 (H) 04/06/2017 1402   RBC 4.67 04/06/2017 1402   HGB 15.2 04/06/2017 1402   HCT 43.2 04/06/2017 1402   PLT 157 04/06/2017 1402   MCV 92.5 04/06/2017 1402   MCH 32.5 04/06/2017 1402   MCHC 35.2 04/06/2017 1402   RDW 13.6 04/06/2017 1402   LYMPHSABS 1.9 04/06/2017 1402   MONOABS 1.1 (H) 04/06/2017 1402   EOSABS 0.0 04/06/2017 1402   BASOSABS 0.0 04/06/2017 1402   CMP Latest Ref Rng & Units 04/06/2017 03/16/2017 02/19/2017  Glucose 65 - 99 mg/dL 299(H) 139(H) -  BUN 6 - 20 mg/dL 18 13 -  Creatinine 0.61 - 1.24 mg/dL 0.96 0.65 -  Sodium 135 - 145 mmol/L 135 140 -  Potassium 3.5 - 5.1 mmol/L 3.9 3.2(L) -  Chloride 101 - 111 mmol/L 96(L) 103 -  CO2 22 - 32 mmol/L 24 31 -  Calcium 8.9 - 10.3 mg/dL 9.3 9.8 -  Total Protein 6.5 - 8.1 g/dL 6.8 - 5.8(L)  Total Bilirubin 0.3 - 1.2 mg/dL 1.2 - -  Alkaline Phos 38 - 126 U/L 71  - -  AST 15 - 41 U/L 22 - -  ALT 17 - 63 U/L 17 - -   Results for [name], [name] (MRN [id number])   Ref. Range 02/19/2017 10:38  Albumin SerPl Elph-Mcnc Latest Ref Range: 2.9 - 4.4 g/dL 3.2  Albumin/Glob SerPl Latest Ref Range: 0.7 - 1.7  1.3  Alpha2 Glob SerPl Elph-Mcnc Latest Ref Range: 0.4 - 1.0 g/dL 0.7  Alpha 1 Latest Ref Range: 0.0 - 0.4 g/dL 0.2  Gamma Glob SerPl Elph-Mcnc Latest Ref Range: 0.4 - 1.8 g/dL 0.9  IFE 1 Unknown Comment  Globulin, Total Latest Ref Range: 2.2 - 3.9 g/dL 2.6  B-Globulin SerPl Elph-Mcnc Latest Ref Range: 0.7 - 1.3 g/dL 0.8  IgG (Immunoglobin G), Serum Latest Ref Range: 700 - 1,600 mg/dL 644 (L)  IgM (Immunoglobulin M), Srm Latest Ref Range: 15 - 143 mg/dL 400 (H)  M Protein SerPl Elph-Mcnc Latest Ref Range: Not Observed g/dL 0.3 (H)        PENDING LABS:     DIAGNOSTIC IMAGING:  Last bone scan: 08/26/15    PATHOLOGY:  Bowel resection path: 02/14/04     Prostate biopsy: 08/10/13             ASSESSMENT & PLAN:   Stage IV metastatic prostate cancer:  -Diagnosed 07/2013 revealing prostate adenocarcinoma with perineural invasion, Gleason 7 (4+3);  now with bone mets.  -PSA has been excellent at 0.01 for quite some time.  PSA pending for today.  -Continue Zytiga/prednisone.  Reports compliance with medications as prescribed; denies any missed doses.   -Continue Lupron androgen-deprivation injections every 6 months; next Lupron will be due in 08/2017.  -No need for restaging imaging at this time given stability of PSA.  Should his PSA begin to increase, will restage with CT chest/abd/pelvis and bone scan.   -Return for follow-up visit in late 03/2017 as previously planned, in anticipation for his short-term relocation to [place] for the winter months.   Addendum:      *PSA resulted after patient left clinic. PSA 0.03. Still very low, but higher than previous. Will monitor with labs when he returns in 4 months. If continued increase  noted, then will consider restaging imaging at that time.    Bone mets:  -Last bone scan in 08/2015 with improving abnormal activity in lower lumbar spine and sternum; stable abnormal activity in right side of T12 vertebral body with no new areas of abnormal activity.  -Patient with no new bone pain or complaints today.  -Serum calcium adequate and he will receive Delton See today as scheduled.   -Of note: He will miss January, February, and March monthly doses of Xgeva. Offered to try to coordinate for him to have injections in [place], but he prefers to wait until he returns to [facility] in the Spring. Discussed with Dr. Talbert Cage and this is acceptable.  I recommended he take calcium/vitamin D supplements while he is in [place] monthly Xgeva in 07/2017.     Recently diagnosed vitamin B12 deficiency:  -Being managed by neurology with vitamin B12 injections, then oral B12 supplementation per patient.  -Continue medications as directed by PCP.    Recently diagnosed IgM monoclonal gammopathy:  -Labs drawn by neurology on 02/19/17.  M-spike low at 0.3.  IFE showed IgM monoclonal protein with lambda light chain specificity.  In 01/2017, his calcium and kidney function were normal. No anemia, neutropenia, or thrombocytopenia appreciated.  -Reiterated previous discussions regarding IgM monoclonal protein, which is more likely to be Waldenstroms gammopathy vs monoclonal gammopathy of unknown significance (MGUS). Not likely multiple myeloma. Discussed with Dr. Talbert Cage. Given that his M-spike is so low, his comorbid conditions including metastatic prostate cancer and his age, we favor continuing to monitor this finding with peripheral blood work evaluation when he returns for follow-up visit vs more invasive testing (like bone marrow biopsy).  Mr. Fabiano and his daughter agree with this plan.     GIST:  -Diagnosed in 01/2004; s/p partial resection. No clinical symptoms to suggest recurrence at this time.    -Continue to monitor; can image as clinically indicated, but no routine imaging necessary.    DLBCL:  -Diagnosed in 2005. No lymphadenopathy. No anemia or thrombocytopenia. No leukocytosis. No "B" symptoms including fever, chills, or night sweats.  -Continue to monitor.    Medication concerns:  -Concerns by patient and family re: his antihypertensives, potassium supplements, and diuretics. Explained that their PCP or cardiologist are the best expert to help manage these medications.   -Encouraged him to continue current medications for now, as prescribed, and contact PCP's office for work-in appointment before he leaves for temporary relocation to [place].   -Serum potassium is normal today at 3.9, so explained that while he is on Lasix I would continue taking potassium supplements as prescribed.         Dispo:  -Will forgo  January-March monthly Xgeva injections for patient's temporary relocation to [place].  -Return to cancer center in 07/2017 for follow-up with labs and Xgeva injection.    All questions were answered to patient's stated satisfaction. Encouraged patient to call with any new concerns or questions before his next visit to the cancer center and we can certain see him sooner, if needed.    Plan of care discussed with Dr. Talbert Cage, who agrees with the above aforementioned.     Orders placed this encounter:  Orders Placed This Encounter  Procedures  . CBC with Differential/Platelet  . Comprehensive metabolic panel  . Kappa/lambda light chains  . Immunofixation electrophoresis  . IgG, IgA, IgM  . Protein electrophoresis, serum  . Beta 2 microglobulin, serum  . Lactate dehydrogenase  . PSA      Mike Craze, NP [place] [phone]

## 2017-04-07 ENCOUNTER — Encounter (HOSPITAL_COMMUNITY): Payer: Self-pay | Admitting: Adult Health

## 2017-04-07 DIAGNOSIS — H35341 Macular cyst, hole, or pseudohole, right eye: Secondary | ICD-10-CM | POA: Diagnosis not present

## 2017-04-07 DIAGNOSIS — H35371 Puckering of macula, right eye: Secondary | ICD-10-CM | POA: Diagnosis not present

## 2017-04-09 DIAGNOSIS — D649 Anemia, unspecified: Secondary | ICD-10-CM | POA: Diagnosis not present

## 2017-04-16 ENCOUNTER — Ambulatory Visit (INDEPENDENT_AMBULATORY_CARE_PROVIDER_SITE_OTHER): Payer: Medicare Other | Admitting: *Deleted

## 2017-04-16 DIAGNOSIS — I480 Paroxysmal atrial fibrillation: Secondary | ICD-10-CM | POA: Diagnosis not present

## 2017-04-16 DIAGNOSIS — Z5181 Encounter for therapeutic drug level monitoring: Secondary | ICD-10-CM

## 2017-04-16 LAB — POCT INR: INR: 3.2

## 2017-04-16 NOTE — Patient Instructions (Signed)
Decrease coumadin to 2 tablets daily except 1 tablet on Tuesdays and Fridays Recheck in 4 weeks in [place]. Will be back in April

## 2017-04-21 ENCOUNTER — Other Ambulatory Visit (HOSPITAL_COMMUNITY): Payer: Self-pay | Admitting: *Deleted

## 2017-04-21 DIAGNOSIS — C61 Malignant neoplasm of prostate: Secondary | ICD-10-CM

## 2017-04-21 MED ORDER — ABIRATERONE ACETATE 250 MG PO TABS: 1000.0000 mg | ORAL_TABLET | Freq: Every day | ORAL | 6 refills | Status: DC

## 2017-04-29 DIAGNOSIS — L0291 Cutaneous abscess, unspecified: Secondary | ICD-10-CM | POA: Diagnosis not present

## 2017-05-03 ENCOUNTER — Telehealth: Payer: Self-pay | Admitting: *Deleted

## 2017-05-03 NOTE — Telephone Encounter (Signed)
Pt has boil on his back.  States he is only on Clindamycin 300mg  tid x 7-10 days.  Started it yesterday. Told pt it should not interfere with coumadin.  He is scheduled to get INR check on Monday 05/10/17.  He verbalized understanding.

## 2017-05-03 NOTE — Telephone Encounter (Signed)
Patient was put on Clindamycin 300mg  Randaxy 21tabs QD / please call with any instructions / tg

## 2017-05-04 ENCOUNTER — Ambulatory Visit (HOSPITAL_COMMUNITY): Payer: Medicare Other

## 2017-05-04 ENCOUNTER — Encounter (HOSPITAL_COMMUNITY): Payer: Self-pay | Admitting: Adult Health

## 2017-05-04 ENCOUNTER — Other Ambulatory Visit (HOSPITAL_COMMUNITY): Payer: Medicare Other

## 2017-05-07 DIAGNOSIS — D509 Iron deficiency anemia, unspecified: Secondary | ICD-10-CM | POA: Diagnosis not present

## 2017-05-13 DIAGNOSIS — Z5181 Encounter for therapeutic drug level monitoring: Secondary | ICD-10-CM | POA: Diagnosis not present

## 2017-05-13 DIAGNOSIS — I480 Paroxysmal atrial fibrillation: Secondary | ICD-10-CM | POA: Diagnosis not present

## 2017-05-13 LAB — PROTIME-INR: INR: 1.6 — AB (ref 0.9–1.1)

## 2017-05-14 ENCOUNTER — Ambulatory Visit (INDEPENDENT_AMBULATORY_CARE_PROVIDER_SITE_OTHER): Payer: Medicare Other | Admitting: Cardiology

## 2017-05-14 DIAGNOSIS — I4891 Unspecified atrial fibrillation: Secondary | ICD-10-CM

## 2017-05-14 DIAGNOSIS — Z5181 Encounter for therapeutic drug level monitoring: Secondary | ICD-10-CM | POA: Diagnosis not present

## 2017-05-16 DIAGNOSIS — M79675 Pain in left toe(s): Secondary | ICD-10-CM | POA: Diagnosis not present

## 2017-05-16 DIAGNOSIS — E11621 Type 2 diabetes mellitus with foot ulcer: Secondary | ICD-10-CM | POA: Diagnosis not present

## 2017-05-16 DIAGNOSIS — Z79899 Other long term (current) drug therapy: Secondary | ICD-10-CM | POA: Diagnosis not present

## 2017-05-16 DIAGNOSIS — R310 Gross hematuria: Secondary | ICD-10-CM | POA: Diagnosis not present

## 2017-05-16 DIAGNOSIS — E119 Type 2 diabetes mellitus without complications: Secondary | ICD-10-CM | POA: Diagnosis not present

## 2017-05-17 ENCOUNTER — Other Ambulatory Visit (HOSPITAL_COMMUNITY): Payer: Self-pay | Admitting: *Deleted

## 2017-05-17 DIAGNOSIS — C61 Malignant neoplasm of prostate: Secondary | ICD-10-CM

## 2017-05-17 MED ORDER — PREDNISONE 5 MG PO TABS: 5.0000 mg | ORAL_TABLET | Freq: Two times a day (BID) | ORAL | 2 refills | Status: DC

## 2017-05-24 ENCOUNTER — Other Ambulatory Visit: Payer: Self-pay | Admitting: Cardiology

## 2017-05-24 DIAGNOSIS — M10072 Idiopathic gout, left ankle and foot: Secondary | ICD-10-CM | POA: Diagnosis not present

## 2017-05-28 ENCOUNTER — Telehealth: Payer: Self-pay | Admitting: *Deleted

## 2017-05-28 ENCOUNTER — Ambulatory Visit (INDEPENDENT_AMBULATORY_CARE_PROVIDER_SITE_OTHER): Payer: Medicare Other | Admitting: Internal Medicine

## 2017-05-28 DIAGNOSIS — Z7901 Long term (current) use of anticoagulants: Secondary | ICD-10-CM | POA: Diagnosis not present

## 2017-05-28 DIAGNOSIS — Z5181 Encounter for therapeutic drug level monitoring: Secondary | ICD-10-CM

## 2017-05-28 DIAGNOSIS — I4891 Unspecified atrial fibrillation: Secondary | ICD-10-CM | POA: Diagnosis not present

## 2017-05-28 DIAGNOSIS — R791 Abnormal coagulation profile: Secondary | ICD-10-CM | POA: Diagnosis not present

## 2017-05-28 LAB — PROTIME-INR: INR: 2.2 — AB (ref 0.9–1.1)

## 2017-05-28 NOTE — Telephone Encounter (Signed)
Received call from ER nurse at [facility] and she states his INR is 2.24 so will call pt and dose his coumadin

## 2017-05-28 NOTE — Telephone Encounter (Signed)
Spoke with Mrs Umland and she states his arm is bleeding less Stressed to her not as concerned about his arm but concerned about what INR might be This nurse called the ER at the [facility] and informed them about pt being on Coumadin and taking large doses of Prednisone and she states they will see pt as  ER visit as they cannot take orders from out of state This nurse called Mrs Rayford and instructed to take pt  back to ER and sign for an ER visit to be seen by MD and she states will do so

## 2017-05-28 NOTE — Telephone Encounter (Signed)
Patient's wife stating that patient was given Cortisone shot for Gout and put on Prednisone for 5 days. Patient's wife states that he hit his arm and it is bleeding. Please call with instructions. / tg

## 2017-05-28 NOTE — Telephone Encounter (Signed)
Received call from [facility] and informed they do not do same day labs and they instructed [name] to go to ER to have INR done and she states they are going to ER

## 2017-05-28 NOTE — Telephone Encounter (Signed)
Pt's wife states he had a steriod injection and was placed on Prednisone 20 mg to take 5 tabs times 1 then 4 tabs times 1 then 3 tabs times 1  then 2 tabs times 1  and then 1 tab will finish tomorrow Feb 9th Pt's wife also states has a skin tear on left forearm that is slowly oozing blood Pt started the Prednisone on 05/25/2017 He was scheduled for INR today Feb 8th Instructed to go to lab and get INR done stat and to have lab fax results  to our office in [place] ASAP If any questions please call our office at [phone]

## 2017-05-28 NOTE — Telephone Encounter (Signed)
See coumadin encounter of this date 

## 2017-05-28 NOTE — Patient Instructions (Signed)
Description   Continue taking 2 tablets daily except 1 tablet on Tuesdays and Fridays.  Recheck in 3 weeks in [facility] & will be back in April.

## 2017-06-01 ENCOUNTER — Ambulatory Visit (HOSPITAL_COMMUNITY): Payer: Medicare Other

## 2017-06-01 ENCOUNTER — Other Ambulatory Visit (HOSPITAL_COMMUNITY): Payer: Medicare Other

## 2017-06-02 DIAGNOSIS — L089 Local infection of the skin and subcutaneous tissue, unspecified: Secondary | ICD-10-CM | POA: Diagnosis not present

## 2017-06-02 DIAGNOSIS — I739 Peripheral vascular disease, unspecified: Secondary | ICD-10-CM | POA: Diagnosis not present

## 2017-06-02 DIAGNOSIS — L97922 Non-pressure chronic ulcer of unspecified part of left lower leg with fat layer exposed: Secondary | ICD-10-CM | POA: Diagnosis not present

## 2017-06-06 ENCOUNTER — Other Ambulatory Visit: Payer: Self-pay | Admitting: Cardiology

## 2017-06-08 ENCOUNTER — Telehealth: Payer: Self-pay | Admitting: *Deleted

## 2017-06-08 NOTE — Telephone Encounter (Signed)
Please call pt's wife--he's taken antibiotics for his toe, please give them a call @ [phone]

## 2017-06-08 NOTE — Telephone Encounter (Signed)
Spoke with wife.  They are still in [place].  Pt has and infection of his toe.  Was put on Doxycycline 100mg  daily last week.  He has 2 days left.  He also has a follow up appt with the podiatrist tomorrow.  Wife thinks they will put him on another antibiotic.  Told her if heis started on another Abx that will interfere with coumadin he should have INR checked tomorrow before starting it.  If not continue current dose of coumadin and check INR next week as planned.  She will call me tomorrow and let me know.

## 2017-06-09 DIAGNOSIS — L97922 Non-pressure chronic ulcer of unspecified part of left lower leg with fat layer exposed: Secondary | ICD-10-CM | POA: Diagnosis not present

## 2017-06-09 DIAGNOSIS — M86672 Other chronic osteomyelitis, left ankle and foot: Secondary | ICD-10-CM | POA: Diagnosis not present

## 2017-06-09 NOTE — Telephone Encounter (Signed)
Pt's wife lvm stating the antibiotic they are putting him on is SMZ TMP DS [id number], the pharmacists won't fill it till they hear back. She can be reached @ [phone]

## 2017-06-09 NOTE — Telephone Encounter (Signed)
Called wife back x 2 with no answer.  Left message on phone for pt to start Bactrim DS but cut coumadin dose back to 2.5mg  daily except 5mg  on Sundays and Wednesdays till INR check on 06/16/17.

## 2017-06-10 ENCOUNTER — Telehealth: Payer: Self-pay | Admitting: *Deleted

## 2017-06-10 DIAGNOSIS — B351 Tinea unguium: Secondary | ICD-10-CM | POA: Diagnosis not present

## 2017-06-10 DIAGNOSIS — I739 Peripheral vascular disease, unspecified: Secondary | ICD-10-CM | POA: Diagnosis not present

## 2017-06-10 DIAGNOSIS — Z4800 Encounter for change or removal of nonsurgical wound dressing: Secondary | ICD-10-CM | POA: Diagnosis not present

## 2017-06-10 DIAGNOSIS — E11621 Type 2 diabetes mellitus with foot ulcer: Secondary | ICD-10-CM | POA: Diagnosis not present

## 2017-06-10 DIAGNOSIS — I10 Essential (primary) hypertension: Secondary | ICD-10-CM | POA: Diagnosis not present

## 2017-06-10 DIAGNOSIS — L97509 Non-pressure chronic ulcer of other part of unspecified foot with unspecified severity: Secondary | ICD-10-CM | POA: Diagnosis not present

## 2017-06-10 NOTE — Telephone Encounter (Signed)
Spoke with wife to confirm she received my voice message yesterday afternoon.  States she did not listen to message.  Pt was started on clindamycin 300mg  tida yesterday and Bactrim DS.  [facility] would not fill Bactrim until pt talked with me.  Told pt it was ok to start Bactrim but he needed to decrease coumadin dose to 1 tablet (2.5mg ) daily except 2 tablets (5mg ) on Sundays and Wednesdays until INR check next Wednesday. 06/16/17.  He verbalized understanding.  States home health nurse is to start seeing him today for wound care. (osteomylitis of toe)  She can check INR next week if she has the capability and call/fax results to me.

## 2017-06-11 DIAGNOSIS — E11621 Type 2 diabetes mellitus with foot ulcer: Secondary | ICD-10-CM | POA: Diagnosis not present

## 2017-06-11 DIAGNOSIS — Z4800 Encounter for change or removal of nonsurgical wound dressing: Secondary | ICD-10-CM | POA: Diagnosis not present

## 2017-06-11 DIAGNOSIS — I10 Essential (primary) hypertension: Secondary | ICD-10-CM | POA: Diagnosis not present

## 2017-06-11 DIAGNOSIS — L97509 Non-pressure chronic ulcer of other part of unspecified foot with unspecified severity: Secondary | ICD-10-CM | POA: Diagnosis not present

## 2017-06-11 DIAGNOSIS — B351 Tinea unguium: Secondary | ICD-10-CM | POA: Diagnosis not present

## 2017-06-11 DIAGNOSIS — I739 Peripheral vascular disease, unspecified: Secondary | ICD-10-CM | POA: Diagnosis not present

## 2017-06-12 DIAGNOSIS — E11621 Type 2 diabetes mellitus with foot ulcer: Secondary | ICD-10-CM | POA: Diagnosis not present

## 2017-06-12 DIAGNOSIS — B351 Tinea unguium: Secondary | ICD-10-CM | POA: Diagnosis not present

## 2017-06-12 DIAGNOSIS — L97509 Non-pressure chronic ulcer of other part of unspecified foot with unspecified severity: Secondary | ICD-10-CM | POA: Diagnosis not present

## 2017-06-12 DIAGNOSIS — I10 Essential (primary) hypertension: Secondary | ICD-10-CM | POA: Diagnosis not present

## 2017-06-12 DIAGNOSIS — Z4800 Encounter for change or removal of nonsurgical wound dressing: Secondary | ICD-10-CM | POA: Diagnosis not present

## 2017-06-12 DIAGNOSIS — I739 Peripheral vascular disease, unspecified: Secondary | ICD-10-CM | POA: Diagnosis not present

## 2017-06-13 DIAGNOSIS — B351 Tinea unguium: Secondary | ICD-10-CM | POA: Diagnosis not present

## 2017-06-13 DIAGNOSIS — Z4800 Encounter for change or removal of nonsurgical wound dressing: Secondary | ICD-10-CM | POA: Diagnosis not present

## 2017-06-13 DIAGNOSIS — I739 Peripheral vascular disease, unspecified: Secondary | ICD-10-CM | POA: Diagnosis not present

## 2017-06-13 DIAGNOSIS — I10 Essential (primary) hypertension: Secondary | ICD-10-CM | POA: Diagnosis not present

## 2017-06-13 DIAGNOSIS — E11621 Type 2 diabetes mellitus with foot ulcer: Secondary | ICD-10-CM | POA: Diagnosis not present

## 2017-06-13 DIAGNOSIS — L97509 Non-pressure chronic ulcer of other part of unspecified foot with unspecified severity: Secondary | ICD-10-CM | POA: Diagnosis not present

## 2017-06-14 DIAGNOSIS — L97924 Non-pressure chronic ulcer of unspecified part of left lower leg with necrosis of bone: Secondary | ICD-10-CM | POA: Diagnosis not present

## 2017-06-15 ENCOUNTER — Telehealth (INDEPENDENT_AMBULATORY_CARE_PROVIDER_SITE_OTHER): Payer: Self-pay | Admitting: Orthopaedic Surgery

## 2017-06-15 NOTE — Telephone Encounter (Signed)
Patient's daughter [name] called stating that Jonah has been in [place] for the past 2 months and went to a podiatrist who recommended that he have part of his left big toe amputated due to diabetes.  Patient wants to come back to [facility] and plans to go to [facility] this evening.  Patient's diabetic doctor in [place] was called to oversee his care.  [name] wanted our office to know what was happening just in case he needed Dr. Durward Fortes who he saw a few years ago.

## 2017-06-15 NOTE — Telephone Encounter (Signed)
Please advise 

## 2017-06-15 NOTE — Telephone Encounter (Signed)
Called daughter and told her to call Baldo Ash to schedule

## 2017-06-15 NOTE — Telephone Encounter (Signed)
Can see in [place] tomorrow  rather than go to ER tonite

## 2017-06-16 ENCOUNTER — Ambulatory Visit (INDEPENDENT_AMBULATORY_CARE_PROVIDER_SITE_OTHER): Payer: Medicare Other | Admitting: Orthopaedic Surgery

## 2017-06-16 ENCOUNTER — Encounter (INDEPENDENT_AMBULATORY_CARE_PROVIDER_SITE_OTHER): Payer: Self-pay | Admitting: Orthopaedic Surgery

## 2017-06-16 ENCOUNTER — Ambulatory Visit (INDEPENDENT_AMBULATORY_CARE_PROVIDER_SITE_OTHER): Payer: Medicare Other

## 2017-06-16 ENCOUNTER — Telehealth: Payer: Self-pay | Admitting: *Deleted

## 2017-06-16 VITALS — BP 164/104 | HR 91 | Resp 12 | Ht 70.0 in | Wt 163.0 lb

## 2017-06-16 DIAGNOSIS — M79675 Pain in left toe(s): Secondary | ICD-10-CM

## 2017-06-16 NOTE — Telephone Encounter (Signed)
Spoke with pt's daughter [name].  Pt is back from [place] and was started on Doxycycline 100mg  daily x 10 days on 06/15/17 for osteo of toe.  Finished clindamycin today.  Pt to resume coumadin 5mg  daily except 2.5mg  on Tuesdays and Fridays until INR check on 06/21/17.  She verbalized understanding.

## 2017-06-16 NOTE — Progress Notes (Signed)
Office Visit Note   Patient: [name]           Date of Birth: 1924-03-09           MRN: [id number] Visit Date: 06/16/2017              Requested by: Rosita Fire, MD [address] [place], [place] [address] PCP: Rosita Fire, MD   Assessment & Plan: Visit Diagnoses:  1. Great toe pain, left     Plan: [name] has diabetic peripheral neuropathy and vascular disease. He's developed an ulcer at the tip of the left great toe. He's had some recent ascending lymphangitis which has resolved with a combination of antibiotics that were prescribed in [place]. There is evidence of probable osteomyelitis of the tip of the distal phalanx left great toe. Presently he is stable. We will apply ibuprofen percent to the to the ulcer and see him on a weekly basis. Continue with the doxycycline. I'd like him to be seen by the vascular surgeons to assess the arterial status of his left foot. I think at some point is going to need an amputation of at least the great toe and have discussed this in great detail with the family. Presently he is comfortable  Follow-Up Instructions: Return in about 1 week (around 06/23/2017).   Orders:  Orders Placed This Encounter  Procedures  . XR Toe Great Left   No orders of the defined types were placed in this encounter.     Procedures: No procedures performed   Clinical Data: No additional findings.   Subjective: Chief Complaint  Patient presents with  . Left Foot - Wound Check, Pain    [name] is a 84 y o here for a Left great toe infection. Diabetic,  [name] is 84 years old and here with his family for evaluation of problems having with his left great toe. They spend about 3 months in [place]. Within the last several weeks he has noted an ulcer at the tip of the left great toe and is been seen on number of occasions in [place] being treated with numerous antibiotics. Apparently he had some ascending lymphangitis and/or cellulitis.  This appears to have resolved with antibiotics. Presently he is on clindamycin and doxycycline. He was told that he might have to have his toe removed and loss, came back to [place] for further treatment. He does have a history of atrial fibrillation and is on Coumadin. He also is diabetic. HPI  Review of Systems   Objective: Vital Signs: BP (!) 164/104   Pulse 91   Resp 12   Ht 5' 10"  (1.778 m)   Wt 163 lb (73.9 kg)   BMI 23.39 kg/m   Physical Exam  Ortho Exam awake alert and oriented 3. Comfortable sitting. Not short of breath or chest pain no pulses left foot. There is a 1 cm ulcer on the plantar aspect of the tip of the left great toe. The the nail is still present but still somewhat loose. There is small superficial ulcer along the lateral aspect of the midportion of the proximal phalanx. No evidence of a ascending lymphangitis or cellulitis. Limited sensibility to the foot related to his diabetes. Ulcer and gangrenous changes to the tip of the toe is dry. Toe is minimally swollen. No specialty comments available.  Imaging: Xr Toe Great Left  Result Date: 06/16/2017 Films of the left great toe demonstrates some decreased bone density at the very tip  of the distal phalanx. Could be consistent with osteomyelitis. Dr.    Steva Ready History: Patient Active Problem List   Diagnosis Date Noted  . Acute bronchitis with bronchospasm 03/16/2017  . Type 2 diabetes mellitus ([place]) 03/16/2017  . Hypomagnesemia 03/16/2017  . 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 Postural dizziness with presyncope 02/19/2017  . Polyneuropathy 02/19/2017  . Numbness 02/19/2017  . Urinary frequency 02/19/2017  . Genetic testing 02/09/2017  . Family history of breast cancer   . Family history of colon cancer   . Other dysphagia 01/21/2016  . Bone metastases ([place]) 08/21/2015  . Prostate cancer ([place]) 09/08/2013  . Hypertrophy of prostate with urinary obstruction and other lower urinary  tract symptoms (LUTS) 08/10/2013  . Encounter for therapeutic drug monitoring 06/12/2013  . NHL (non-Hodgkin's lymphoma) ([place]) 08/18/2011  . 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 Long term (current) use of anticoagulants 07/18/2010  . Essential hypertension 10/04/2008  . ATRIAL FIBRILLATION 10/04/2008   Past Medical History:  Diagnosis Date  . Atrial fibrillation ([facility])    Dr. Harl Bowie- [facility] follows saw 11'14  . Bladder stones    tx. with oral meds and antibiotics, now surgery planned  . Cataracts, both eyes    surgery planned May 2015  . Diabetes mellitus without complication ([place])   . Family history of breast cancer   . Family history of colon cancer   . 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 Margot Chimes, thus far without recurrence.   . Hypertension   . NHL (non-Hodgkin's lymphoma) ([facility]) 08/18/2011   Diffuse large B-cell lymphoma, clinically stage IIIA, CD20 positive, status post cervical lymph node biopsy 08/03/2003 on the left. PET scan was also positive in the spleen and small bowel region, but bone marrow aspiration and biopsy were negative. So he essentially had stage IIIAs. He received R-CHOP x6 cycles with CR established by PET scan criteria on 11/23/2003 with no evidence for relapse th    Family History  Problem Relation Age of Onset  . Heart attack Mother        died in her 62s  . Other Father 57       pneumonia - died  . Breast cancer Sister        dx in her 64s-60s  . Colon cancer Brother        dx in his 62s  . Cancer Sister        TAH/BSO due to "male cancer"  . Breast cancer Daughter 62  . Breast cancer Daughter 72       DCIS - ATM VUS on Invitae 83 gene panel in 2018  . Breast cancer  Daughter 58       DCIS - Negative on Myriad MyRisk 25 gene apnel in 2015  . Thyroid cancer Daughter 75       Medullary and Papillary  . Thyroid nodules Grandchild     Past Surgical History:  Procedure Laterality Date  . CATARACT EXTRACTION Right 08/2013  . CHOLECYSTECTOMY    . CYSTOSCOPY WITH LITHOLAPAXY N/A 08/10/2013   Procedure: CYSTOSCOPY WITH LITHOLAPAXY WITH [name];  Surgeon: [name], MD;  Location: [facility];  Service: Urology;  Laterality: N/A;  . ESOPHAGEAL DILATION N/A 02/27/2016   Procedure: ESOPHAGEAL DILATION;  Surgeon:  [name], MD;  [facility];  Service: Endoscopy;  Laterality: N/A;  . ESOPHAGOGASTRODUODENOSCOPY N/A 02/27/2016   Procedure: ESOPHAGOGASTRODUODENOSCOPY (EGD);  Surgeon: [name], MD;  [facility];  Service: Endoscopy;  Laterality: N/A;  12:00  . INCISION AND DRAINAGE PERIRECTAL ABSCESS    . LYMPH NODE DISSECTION Left    '05  . PORT-A-CATH REMOVAL    . PORTACATH PLACEMENT     insertion and removal -last chemotherapy 10 yrs ago  . TRANSURETHRAL RESECTION OF PROSTATE N/A 08/10/2013   Procedure: TRANSURETHRAL RESECTION OF THE PROSTATE WITH GYRUS INSTRUMENTS;  Surgeon: [name], MD;  Location: [facility];  Service: Urology;  Laterality: N/A;   Social History   Occupational History  . Occupation: retired    Fish farm manager: RETIRED    Comment: salesman  Tobacco Use  . Smoking status: Never Smoker  . Smokeless tobacco: Never Used  Substance and Sexual Activity  . Alcohol use: No  . Drug use: No  . Sexual activity: Not Currently     [name], MD   Note - This record has been created using [employer].  Chart creation errors have been sought, but may not always  have been located. Such creation errors do not reflect on  the standard of medical care.

## 2017-06-17 ENCOUNTER — Other Ambulatory Visit: Payer: Self-pay

## 2017-06-17 DIAGNOSIS — L97529 Non-pressure chronic ulcer of other part of left foot with unspecified severity: Secondary | ICD-10-CM

## 2017-06-18 ENCOUNTER — Encounter (HOSPITAL_COMMUNITY): Payer: Medicare Other

## 2017-06-18 DIAGNOSIS — D649 Anemia, unspecified: Secondary | ICD-10-CM | POA: Diagnosis not present

## 2017-06-19 ENCOUNTER — Other Ambulatory Visit (HOSPITAL_COMMUNITY): Payer: Self-pay | Admitting: Oncology

## 2017-06-21 ENCOUNTER — Ambulatory Visit (INDEPENDENT_AMBULATORY_CARE_PROVIDER_SITE_OTHER): Payer: Medicare Other | Admitting: *Deleted

## 2017-06-21 ENCOUNTER — Telehealth (HOSPITAL_COMMUNITY): Payer: Self-pay | Admitting: Emergency Medicine

## 2017-06-21 ENCOUNTER — Other Ambulatory Visit: Payer: Self-pay

## 2017-06-21 ENCOUNTER — Other Ambulatory Visit: Payer: Self-pay | Admitting: *Deleted

## 2017-06-21 ENCOUNTER — Encounter: Payer: Self-pay | Admitting: Vascular Surgery

## 2017-06-21 ENCOUNTER — Other Ambulatory Visit (HOSPITAL_COMMUNITY): Payer: Self-pay | Admitting: *Deleted

## 2017-06-21 ENCOUNTER — Encounter: Payer: Self-pay | Admitting: *Deleted

## 2017-06-21 ENCOUNTER — Ambulatory Visit (HOSPITAL_COMMUNITY)
Admission: RE | Admit: 2017-06-21 | Discharge: 2017-06-21 | Disposition: A | Discharge status: Home / Self Care | Payer: Medicare Other | Source: Ambulatory Visit | Attending: Surgery | Admitting: Surgery

## 2017-06-21 ENCOUNTER — Ambulatory Visit (INDEPENDENT_AMBULATORY_CARE_PROVIDER_SITE_OTHER): Payer: Medicare Other | Admitting: Vascular Surgery

## 2017-06-21 VITALS — BP 140/74 | HR 61 | Temp 98.2°F | Resp 22 | Ht 70.0 in | Wt 163.0 lb

## 2017-06-21 DIAGNOSIS — C61 Malignant neoplasm of prostate: Secondary | ICD-10-CM

## 2017-06-21 DIAGNOSIS — L97529 Non-pressure chronic ulcer of other part of left foot with unspecified severity: Secondary | ICD-10-CM | POA: Diagnosis not present

## 2017-06-21 DIAGNOSIS — I4891 Unspecified atrial fibrillation: Secondary | ICD-10-CM | POA: Diagnosis not present

## 2017-06-21 DIAGNOSIS — Z5181 Encounter for therapeutic drug level monitoring: Secondary | ICD-10-CM | POA: Diagnosis not present

## 2017-06-21 DIAGNOSIS — I480 Paroxysmal atrial fibrillation: Secondary | ICD-10-CM

## 2017-06-21 DIAGNOSIS — I739 Peripheral vascular disease, unspecified: Secondary | ICD-10-CM | POA: Diagnosis not present

## 2017-06-21 DIAGNOSIS — C7951 Secondary malignant neoplasm of bone: Secondary | ICD-10-CM

## 2017-06-21 LAB — POCT INR: INR: 2.7

## 2017-06-21 MED ORDER — POTASSIUM CHLORIDE CRYS ER 10 MEQ PO TBCR: 20.0000 meq | EXTENDED_RELEASE_TABLET | Freq: Every day | ORAL | 0 refills | Status: DC

## 2017-06-21 MED ORDER — AMOXICILLIN-POT CLAVULANATE 500-125 MG PO TABS: 1.0000 | ORAL_TABLET | Freq: Three times a day (TID) | ORAL | Status: DC

## 2017-06-21 NOTE — Telephone Encounter (Signed)
[name] and [name] came to the clinic to ask about his 40 pound weight loss since being on Zytiga, if he needed a lesser dose.  Also, wanted a refill on potassium.  Pt is fixing to have surgery on his left toe.  Spoke with Dr Walden Field.  OK to refill potassium for 1 month, then follow up with PCP.  Zytiga is not weight based therefore his dose does not need to be changed.  Dose reduction would come if pt was not tolerating the medication such as having side effects from medication.  [name] asking about the prednisone related to his diabetes.  The Zytiga has to be taken with prednisone.  The prednisone is a small dose and should not affect his diabetes too much.  Dr Walden Field also ordered CT chest/abd/pelvis and bone survey.  CT scan ordered for 07/02/2017 arrive 12:45 pm.  He will be NPO for 4 hours prior.  Daughter know to pick up contrast prior to this date from radiology.  Bone survey scheduled for 07/07/2017 at 1:00pm.  Follow up appt with labs, injections and to see Dr Raliegh Ip 3/22 at 2:40 pm.  Stop to get labs first then come up to see the doctor.  If any of these appts interfere with pts upcoming foot surgery, daughter was given the schedulers number to call and reschedule these appts.

## 2017-06-21 NOTE — H&P (View-Only) (Signed)
Referring Physician: Joni Fears MD  Patient name: [name] MRN: [id number] DOB: [birth year] Sex: male  REASON FOR CONSULT: Nonhealing wound left first toe  HPI: [name] is a 84 y.o. male a nonhealing wound of his left first toe.  The wound started about 1 month ago.  It was either a blister or a small ulcer.  The patient was initially treated for gout with prednisone.  He subsequently was placed on antibiotics by a podiatrist in [place].  They discussed with him amputating the tip of the toe.  He returned back to [place] for further evaluation.  He was just seen by Dr. Durward Fortes and noted to have osteomyelitis in the tip of the toe.  Dr. Durward Fortes noted an abnormal arterial pulse exam and referred him to [place] for further evaluation.  Other medical problems include atrial fibrillation for which she is on warfarin.  He also has diabetes.  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  His high-dose prednisone has been discontinued.  He denies shortness of breath or chest pain.  He states he can climb 13 stairs in his home without shortness of breath.  He denies claudication symptoms.  He has never had any nonhealing wounds.  Past Medical History:  Diagnosis Date  . Atrial fibrillation ([place])    Dr. Harl Bowie- [facility] follows saw 11'14  . Bladder stones    tx. with oral meds and antibiotics, now surgery planned  . Cataracts, both eyes    surgery planned May 2015  . Diabetes mellitus without complication ([place])   . Family history of breast cancer   . Family history of colon cancer   . GIST (gastrointestinal stromal tumor), malignant ([facility]) 08/18/2011   Gastrointestinal stromal tumor, that is GIST, small bowel, 4.5 cm, intermediate prognostic grade found on the PET scan in the small bowel, accounting for that small bowel activity in October 2005 with resection by Dr. Margot Chimes, thus far without recurrence.   . Hypertension     . NHL (non-Hodgkin's lymphoma) ([place]) 08/18/2011   Diffuse large B-cell lymphoma, clinically stage IIIA, CD20 positive, status post cervical lymph node biopsy 08/03/2003 on the left. PET scan was also positive in the spleen and small bowel region, but bone marrow aspiration and biopsy were negative. So he essentially had stage IIIAs. He received R-CHOP x6 cycles with CR established by PET scan criteria on 11/23/2003 with no evidence for relapse th   Past Surgical History:  Procedure Laterality Date  . CATARACT EXTRACTION Right 08/2013  . CHOLECYSTECTOMY    . CYSTOSCOPY WITH LITHOLAPAXY N/A 08/10/2013   Procedure: CYSTOSCOPY WITH LITHOLAPAXY WITH [name];  Surgeon: [name], MD;  [facility];  Service: Urology;  Laterality: N/A;  . ESOPHAGEAL DILATION N/A 02/27/2016   Procedure: ESOPHAGEAL DILATION;  Surgeon: [name], MD;  [facility];  Service: Endoscopy;  Laterality: N/A;  . ESOPHAGOGASTRODUODENOSCOPY N/A 02/27/2016   Procedure: ESOPHAGOGASTRODUODENOSCOPY (EGD);  Surgeon: [name], MD;  [facility];  Service: Endoscopy;  Laterality: N/A;  12:00  . INCISION AND DRAINAGE PERIRECTAL ABSCESS    . LYMPH NODE DISSECTION Left    '05  . PORT-A-CATH REMOVAL    . PORTACATH PLACEMENT     insertion and removal -last chemotherapy 10 yrs ago  . TRANSURETHRAL RESECTION OF PROSTATE N/A 08/10/2013   Procedure: TRANSURETHRAL RESECTION OF THE PROSTATE WITH GYRUS INSTRUMENTS;  Surgeon: [name], MD;  [facility];  Service: Urology;  Laterality: N/A;    Family History  Problem Relation Age of Onset  . Heart attack Mother        died in her 29s  . Other Father 76       pneumonia - died  . Breast cancer Sister        dx in her 83s-60s  . Colon cancer Brother        dx in his 90s  . Cancer Sister        TAH/BSO due to "male cancer"  . Breast cancer Daughter 35  . Breast cancer Daughter 1       DCIS - ATM VUS on Invitae 83 gene panel  in 2018  . Breast cancer Daughter 42       DCIS - Negative on Myriad MyRisk 25 gene apnel in 2015  . Thyroid cancer Daughter 21       Medullary and Papillary  . Thyroid nodules Grandchild     SOCIAL HISTORY: Social History   Socioeconomic History  . Marital status: Married    Spouse name: Not on file  . Number of children: Not on file  . Years of education: Not on file  . Highest education level: Not on file  Social Needs  . Financial resource strain: Not on file  . Food insecurity - worry: Not on file  . Food insecurity - inability: Not on file  . Transportation needs - medical: Not on file  . Transportation needs - non-medical: Not on file  Occupational History  . Occupation: retired    Fish farm manager: RETIRED    Comment: salesman  Tobacco Use  . Smoking status: Never Smoker  . Smokeless tobacco: Never Used  Substance and Sexual Activity  . Alcohol use: No  . Drug use: No  . Sexual activity: Not Currently  Other Topics Concern  . Not on file  Social History Narrative  . Not on file    No Known Allergies  Current Outpatient Medications  Medication Sig Dispense Refill  . abiraterone acetate (ZYTIGA) 250 MG tablet Take 4 tablets (1,000 mg total) by mouth daily. Take on an empty stomach 1 hour before or 2 hours after a meal 120 tablet 6  . acetaminophen (TYLENOL) 500 MG tablet Take 500-1,000 mg by mouth daily as needed for mild pain, fever or headache.    . Calcium Carb-Cholecalciferol 600-800 MG-UNIT TABS Take 1 capsule by mouth daily.     . Cholecalciferol (VITAMIN D3) 1000 units CAPS Take 1,000 Units by mouth daily.    . clotrimazole-betamethasone (LOTRISONE) cream Apply 1 application topically daily as needed (fungus).     . CRANBERRY PO Take 4,200 mg by mouth daily.    . cyanocobalamin (,VITAMIN B-12,) 1000 MCG/ML injection Inject 1,000 mcg every 30 (thirty) days into the skin.    Marland Kitchen denosumab (XGEVA) 120 MG/1.7ML SOLN injection Inject 120 mg into the skin every 30  (thirty) days.     Marland Kitchen diltiazem (CARDIZEM) 90 MG tablet TAKE ONE TABLET BY MOUTH TWICE DAILY 180 tablet 2  . docusate sodium (COLACE) 100 MG capsule Take 100 mg by mouth daily as needed for mild constipation.    . fludrocortisone (FLORINEF) 0.1 MG tablet Take 1 tablet (0.1 mg total) daily by mouth. 30 tablet 11  . furosemide (LASIX) 20 MG tablet Take 1 tablet (20 mg total) by mouth daily. 30 tablet 11  . glipiZIDE (GLUCOTROL XL) 5 MG 24 hr tablet Take 5  mg by mouth daily with breakfast.     . guaiFENesin (MUCINEX) 600 MG 12 hr tablet Take 1 tablet (600 mg total) by mouth 2 (two) times daily. 30 tablet 0  . KLOR-CON M10 10 MEQ tablet TAKE 1 TABLET BY MOUTH ONCE DAILY 90 tablet 1  . Leuprolide Acetate, 6 Month, (LUPRON DEPOT) 45 MG injection Inject 45 mg into the muscle every 6 (six) months.    Marland Kitchen lisinopril (PRINIVIL,ZESTRIL) 20 MG tablet Take 1 tablet (20 mg total) daily by mouth. 30 tablet 11  . loperamide (IMODIUM) 2 MG capsule Take 2 mg by mouth as needed for diarrhea or loose stools.    . metFORMIN (GLUCOPHAGE) 500 MG tablet Take 500 mg by mouth 2 (two) times daily with a meal.     . metFORMIN (GLUCOPHAGE) 850 MG tablet     . nystatin (MYCOSTATIN) powder Apply topically 4 (four) times daily as needed (fungus).     Marland Kitchen omeprazole (PRILOSEC) 20 MG capsule Take 20 mg by mouth daily.     . potassium chloride (K-DUR,KLOR-CON) 10 MEQ tablet Take 2 tablets (20 mEq total) by mouth daily. 60 tablet 0  . predniSONE (DELTASONE) 5 MG tablet Take 1 tablet (5 mg total) by mouth 2 (two) times daily with a meal. 30 tablet 2  . sennosides-docusate sodium (SENOKOT-S) 8.6-50 MG tablet Take 1 tablet by mouth daily.    . VENTOLIN HFA 108 (90 Base) MCG/ACT inhaler Inhale 1-2 puffs into the lungs every 6 (six) hours as needed.     . warfarin (COUMADIN) 2.5 MG tablet Take as directed by [facility]mad[facility] Clinic. (Patient taking differently: Take 2.5-5 mg by mouth See admin instructions. Take 2.76m on Saturdays and take 510mon all  other days) 160 tablet 0   No current facility-administered medications for this visit.     ROS:   General:  No weight loss, Fever, chills  HEENT: No recent headaches, no nasal bleeding, no visual changes, no sore throat  Neurologic: No dizziness, blackouts, seizures. No recent symptoms of stroke or mini- stroke. No recent episodes of slurred speech, or temporary blindness.  Cardiac: No recent episodes of chest pain/pressure, no shortness of breath at rest.  No shortness of breath with exertion.  Denies history of atrial fibrillation or irregular heartbeat  Vascular: No history of rest pain in feet.  No history of claudication.  No history of non-healing ulcer, No history of DVT   Pulmonary: No home oxygen, no productive cough, no hemoptysis,  No asthma or wheezing  Musculoskeletal:  [X] Arthritis, [X] Low back pain,  [X] Joint pain  Hematologic:No history of hypercoagulable state.  No history of easy bleeding.  No history of anemia  Gastrointestinal: No hematochezia or melena,  No gastroesophageal reflux, no trouble swallowing  Urinary: [ ] chronic Kidney disease, [ ] on HD - [ ] MWF or [ ] TTHS, [ ] Burning with urination, [ ] Frequent urination, [ ] Difficulty urinating;   Skin: No rashes  Psychological: No history of anxiety,  No history of depression   Physical Examination  Vitals:   06/21/17 1412  BP: 140/74  Pulse: 61  Resp: (!) 22  Temp: 98.2 F (36.8 C)  SpO2: 98%  Weight: 163 lb (73.9 kg)  Height: 5' 10" (1.778 m)    Body mass index is 23.39 kg/m.  General:  Alert and oriented, no acute distress HEENT: Normal Neck: No bruit or JVD Pulmonary: Clear to auscultation bilaterally Cardiac: Irregularly irregular without murmur Abdomen:  Soft, non-tender, non-distended, no mass Skin: No rash, ulceration left first toe with some erythema in the forefoot and medial calf left side.  See pictures below Extremity Pulses:  2+ radial, brachial, femoral, 2+ popliteal  absent dorsalis pedis, posterior tibial pulses bilaterally Musculoskeletal: No deformity trace left pedal edema  Neurologic: Upper and lower extremity motor 5/5 and symmetric  DATA:  Patient had bilateral ABIs performed today which were calcified and noncompressible bilaterally.  Toe pressure on the right side was 88.  We were unable to obtain a toe pressure on the left side secondary to the ulcer.  ASSESSMENT: Nonhealing wound left first toe with osteomyelitis.  Patient also has evidence of peripheral arterial disease.  Most likely this is tibial and popliteal occlusive disease.  PLAN: Abdominal aortogram with bilateral lower extremity runoff possible intervention this Friday, June 25, 2017.  We will stop his warfarin after his dose this evening.  I am also going to transition him to Augmentin.  Depending on the findings of his arteriogram on Friday will make a determination on whether or not he needs revascularization prior to toe amputation.  I will discussed this with Dr. Durward Fortes later in the week.   Ruta Hinds, MD [facility] Office: [phone] Pager: [phone]

## 2017-06-21 NOTE — Progress Notes (Signed)
  Referring Physician: Peter Whitfield MD  Patient name: [name] MRN: [id number] DOB: 03/15/1924 Sex: male  REASON FOR CONSULT: Nonhealing wound left first toe  HPI: [name] is a 84 y.o. male a nonhealing wound of his left first toe.  The wound started about 1 month ago.  It was either a blister or a small ulcer.  The patient was initially treated for gout with prednisone.  He subsequently was placed on antibiotics by a podiatrist in [place].  They discussed with him amputating the tip of the toe.  He returned back to  for further evaluation.  He was just seen by Dr. Whitfield and noted to have osteomyelitis in the tip of the toe.  Dr. Whitfield noted an abnormal arterial pulse exam and referred him to us for further evaluation.  Other medical problems include atrial fibrillation for which she is on warfarin.  He also has diabetes.  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  His high-dose prednisone has been discontinued.  He denies shortness of breath or chest pain.  He states he can climb 13 stairs in his home without shortness of breath.  He denies claudication symptoms.  He has never had any nonhealing wounds.  Past Medical History:  Diagnosis Date  . Atrial fibrillation (HCC)    Dr. Branch- LeBauers follows saw 11'14  . Bladder stones    tx. with oral meds and antibiotics, now surgery planned  . Cataracts, both eyes    surgery planned May 2015  . Diabetes mellitus without complication (HCC)   . Family history of breast cancer   . Family history of colon cancer   . 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 Streck, thus far without recurrence.   . Hypertension     . NHL (non-Hodgkin's lymphoma) (HCC) 08/18/2011   Diffuse large B-cell lymphoma, clinically stage IIIA, CD20 positive, status post cervical lymph node biopsy 08/03/2003 on the left. PET scan was also positive in the spleen and small bowel region, but bone marrow aspiration and biopsy were negative. So he essentially had stage IIIAs. He received R-CHOP x6 cycles with CR established by PET scan criteria on 11/23/2003 with no evidence for relapse th   Past Surgical History:  Procedure Laterality Date  . CATARACT EXTRACTION Right 08/2013  . CHOLECYSTECTOMY    . CYSTOSCOPY WITH LITHOLAPAXY N/A 08/10/2013   Procedure: CYSTOSCOPY WITH LITHOLAPAXY WITH STONEBREAKER;  Surgeon: Stephen Dahlstedt, MD;  Location: [facility];  Service: Urology;  Laterality: N/A;  . ESOPHAGEAL DILATION N/A 02/27/2016   Procedure: ESOPHAGEAL DILATION;  Surgeon: Najeeb U Rehman, MD;  [facility]: [facility];  Service: Endoscopy;  Laterality: N/A;  . ESOPHAGOGASTRODUODENOSCOPY N/A 02/27/2016   Procedure: ESOPHAGOGASTRODUODENOSCOPY (EGD);  Surgeon: Najeeb U Rehman, MD;  Location: [facility];  Service: Endoscopy;  Laterality: N/A;  12:00  . INCISION AND DRAINAGE PERIRECTAL ABSCESS    . LYMPH NODE DISSECTION Left    '05  . PORT-A-CATH REMOVAL    . PORTACATH PLACEMENT     insertion and removal -last chemotherapy 10 yrs ago  . TRANSURETHRAL RESECTION OF PROSTATE N/A 08/10/2013   Procedure: TRANSURETHRAL RESECTION OF THE PROSTATE WITH GYRUS INSTRUMENTS;  Surgeon: Stephen Dahlstedt, MD;    Location: [facility];  Service: Urology;  Laterality: N/A;    Family History  Problem Relation Age of Onset  . Heart attack Mother        died in her 69s  . Other Father 45       pneumonia - died  . Breast cancer Sister        dx in her 6s-60s  . Colon cancer Brother        dx in his 35s  . Cancer Sister        TAH/BSO due to "male cancer"  . Breast cancer Daughter 15  . Breast cancer Daughter 65       DCIS - ATM VUS on [employer] 83 gene panel  in 2018  . Breast cancer Daughter 26       DCIS - Negative on Myriad MyRisk 25 gene apnel in 2015  . Thyroid cancer Daughter 83       Medullary and Papillary  . Thyroid nodules Grandchild     SOCIAL HISTORY: Social History   Socioeconomic History  . Marital status: Married    Spouse name: Not on file  . Number of children: Not on file  . Years of education: Not on file  . Highest education level: Not on file  Social Needs  . Financial resource strain: Not on file  . Food insecurity - worry: Not on file  . Food insecurity - inability: Not on file  . Transportation needs - medical: Not on file  . Transportation needs - non-medical: Not on file  Occupational History  . Occupation: retired    Fish farm manager: RETIRED    Comment: salesman  Tobacco Use  . Smoking status: Never Smoker  . Smokeless tobacco: Never Used  Substance and Sexual Activity  . Alcohol use: No  . Drug use: No  . Sexual activity: Not Currently  Other Topics Concern  . Not on file  Social History Narrative  . Not on file    No Known Allergies  Current Outpatient Medications  Medication Sig Dispense Refill  . abiraterone acetate (ZYTIGA) 250 MG tablet Take 4 tablets (1,000 mg total) by mouth daily. Take on an empty stomach 1 hour before or 2 hours after a meal 120 tablet 6  . acetaminophen (TYLENOL) 500 MG tablet Take 500-1,000 mg by mouth daily as needed for mild pain, fever or headache.    . Calcium Carb-Cholecalciferol 600-800 MG-UNIT TABS Take 1 capsule by mouth daily.     . Cholecalciferol (VITAMIN D3) 1000 units CAPS Take 1,000 Units by mouth daily.    . clotrimazole-betamethasone (LOTRISONE) cream Apply 1 application topically daily as needed (fungus).     . CRANBERRY PO Take 4,200 mg by mouth daily.    . cyanocobalamin (,VITAMIN B-12,) 1000 MCG/ML injection Inject 1,000 mcg every 30 (thirty) days into the skin.    Marland Kitchen denosumab (XGEVA) 120 MG/1.7ML SOLN injection Inject 120 mg into the skin every 30  (thirty) days.     Marland Kitchen diltiazem (CARDIZEM) 90 MG tablet TAKE ONE TABLET BY MOUTH TWICE DAILY 180 tablet 2  . docusate sodium (COLACE) 100 MG capsule Take 100 mg by mouth daily as needed for mild constipation.    . fludrocortisone (FLORINEF) 0.1 MG tablet Take 1 tablet (0.1 mg total) daily by mouth. 30 tablet 11  . furosemide (LASIX) 20 MG tablet Take 1 tablet (20 mg total) by mouth daily. 30 tablet 11  . glipiZIDE (GLUCOTROL XL) 5 MG 24 hr tablet Take 5  mg by mouth daily with breakfast.     . guaiFENesin (MUCINEX) 600 MG 12 hr tablet Take 1 tablet (600 mg total) by mouth 2 (two) times daily. 30 tablet 0  . KLOR-CON M10 10 MEQ tablet TAKE 1 TABLET BY MOUTH ONCE DAILY 90 tablet 1  . Leuprolide Acetate, 6 Month, (LUPRON DEPOT) 45 MG injection Inject 45 mg into the muscle every 6 (six) months.    . lisinopril (PRINIVIL,ZESTRIL) 20 MG tablet Take 1 tablet (20 mg total) daily by mouth. 30 tablet 11  . loperamide (IMODIUM) 2 MG capsule Take 2 mg by mouth as needed for diarrhea or loose stools.    . metFORMIN (GLUCOPHAGE) 500 MG tablet Take 500 mg by mouth 2 (two) times daily with a meal.     . metFORMIN (GLUCOPHAGE) 850 MG tablet     . nystatin (MYCOSTATIN) powder Apply topically 4 (four) times daily as needed (fungus).     . omeprazole (PRILOSEC) 20 MG capsule Take 20 mg by mouth daily.     . potassium chloride (K-DUR,KLOR-CON) 10 MEQ tablet Take 2 tablets (20 mEq total) by mouth daily. 60 tablet 0  . predniSONE (DELTASONE) 5 MG tablet Take 1 tablet (5 mg total) by mouth 2 (two) times daily with a meal. 30 tablet 2  . sennosides-docusate sodium (SENOKOT-S) 8.6-50 MG tablet Take 1 tablet by mouth daily.    . VENTOLIN HFA 108 (90 Base) MCG/ACT inhaler Inhale 1-2 puffs into the lungs every 6 (six) hours as needed.     . warfarin (COUMADIN) 2.5 MG tablet Take as directed by [facility]. (Patient taking differently: Take 2.5-5 mg by mouth See admin instructions. Take 2.5mg on Saturdays and take 5mg on all  other days) 160 tablet 0   No current facility-administered medications for this visit.     ROS:   General:  No weight loss, Fever, chills  HEENT: No recent headaches, no nasal bleeding, no visual changes, no sore throat  Neurologic: No dizziness, blackouts, seizures. No recent symptoms of stroke or mini- stroke. No recent episodes of slurred speech, or temporary blindness.  Cardiac: No recent episodes of chest pain/pressure, no shortness of breath at rest.  No shortness of breath with exertion.  Denies history of atrial fibrillation or irregular heartbeat  Vascular: No history of rest pain in feet.  No history of claudication.  No history of non-healing ulcer, No history of DVT   Pulmonary: No home oxygen, no productive cough, no hemoptysis,  No asthma or wheezing  Musculoskeletal:  [X] Arthritis, [X] Low back pain,  [X] Joint pain  Hematologic:No history of hypercoagulable state.  No history of easy bleeding.  No history of anemia  Gastrointestinal: No hematochezia or melena,  No gastroesophageal reflux, no trouble swallowing  Urinary: [ ] chronic Kidney disease, [ ] on HD - [ ] MWF or [ ] TTHS, [ ] Burning with urination, [ ] Frequent urination, [ ] Difficulty urinating;   Skin: No rashes  Psychological: No history of anxiety,  No history of depression   Physical Examination  Vitals:   06/21/17 1412  BP: 140/74  Pulse: 61  Resp: (!) 22  Temp: 98.2 F (36.8 C)  SpO2: 98%  Weight: 163 lb (73.9 kg)  Height: 5' 10" (1.778 m)    Body mass index is 23.39 kg/m.  General:  Alert and oriented, no acute distress HEENT: Normal Neck: No bruit or JVD Pulmonary: Clear to auscultation bilaterally Cardiac: Irregularly irregular without murmur Abdomen:   Soft, non-tender, non-distended, no mass Skin: No rash, ulceration left first toe with some erythema in the forefoot and medial calf left side.  See pictures below Extremity Pulses:  2+ radial, brachial, femoral, 2+ popliteal  absent dorsalis pedis, posterior tibial pulses bilaterally Musculoskeletal: No deformity trace left pedal edema  Neurologic: Upper and lower extremity motor 5/5 and symmetric  DATA:  Patient had bilateral ABIs performed today which were calcified and noncompressible bilaterally.  Toe pressure on the right side was 88.  We were unable to obtain a toe pressure on the left side secondary to the ulcer.  ASSESSMENT: Nonhealing wound left first toe with osteomyelitis.  Patient also has evidence of peripheral arterial disease.  Most likely this is tibial and popliteal occlusive disease.  PLAN: Abdominal aortogram with bilateral lower extremity runoff possible intervention this Friday, June 25, 2017.  We will stop his warfarin after his dose this evening.  I am also going to transition him to Augmentin.  Depending on the findings of his arteriogram on Friday will make a determination on whether or not he needs revascularization prior to toe amputation.  I will discussed this with Dr. Durward Fortes later in the week.   Ruta Hinds, MD [facility] Office: [phone] Pager: [phone]

## 2017-06-21 NOTE — H&P (View-Only) (Signed)
  Referring Physician: Peter Whitfield MD  Patient name: [name] MRN: [id number] DOB: 08/25/1923 Sex: male  REASON FOR CONSULT: Nonhealing wound left first toe  HPI: [name] is a 84 y.o. male a nonhealing wound of his left first toe.  The wound started about 1 month ago.  It was either a blister or a small ulcer.  The patient was initially treated for gout with prednisone.  He subsequently was placed on antibiotics by a podiatrist in [place].  They discussed with him amputating the tip of the toe.  He returned back to  for further evaluation.  He was just seen by Dr. Whitfield and noted to have osteomyelitis in the tip of the toe.  Dr. Whitfield noted an abnormal arterial pulse exam and referred him to us for further evaluation.  Other medical problems include atrial fibrillation for which she is on warfarin.  He also has diabetes.  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  His high-dose prednisone has been discontinued.  He denies shortness of breath or chest pain.  He states he can climb 13 stairs in his home without shortness of breath.  He denies claudication symptoms.  He has never had any nonhealing wounds.  Past Medical History:  Diagnosis Date  . Atrial fibrillation (HCC)    Dr. Branch- LeBauers follows saw 11'14  . Bladder stones    tx. with oral meds and antibiotics, now surgery planned  . Cataracts, both eyes    surgery planned May 2015  . Diabetes mellitus without complication (HCC)   . Family history of breast cancer   . Family history of colon cancer   . 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 Streck, thus far without recurrence.   . Hypertension     . NHL (non-Hodgkin's lymphoma) (HCC) 08/18/2011   Diffuse large B-cell lymphoma, clinically stage IIIA, CD20 positive, status post cervical lymph node biopsy 08/03/2003 on the left. PET scan was also positive in the spleen and small bowel region, but bone marrow aspiration and biopsy were negative. So he essentially had stage IIIAs. He received R-CHOP x6 cycles with CR established by PET scan criteria on 11/23/2003 with no evidence for relapse th   Past Surgical History:  Procedure Laterality Date  . CATARACT EXTRACTION Right 08/2013  . CHOLECYSTECTOMY    . CYSTOSCOPY WITH LITHOLAPAXY N/A 08/10/2013   Procedure: CYSTOSCOPY WITH LITHOLAPAXY WITH STONEBREAKER;  Surgeon: Stephen Dahlstedt, MD;  Location: [facility];  Service: Urology;  Laterality: N/A;  . ESOPHAGEAL DILATION N/A 02/27/2016   Procedure: ESOPHAGEAL DILATION;  Surgeon: Najeeb U Rehman, MD;  [facility];  Service: Endoscopy;  Laterality: N/A;  . ESOPHAGOGASTRODUODENOSCOPY N/A 02/27/2016   Procedure: ESOPHAGOGASTRODUODENOSCOPY (EGD);  Surgeon: Najeeb U Rehman, MD;  [facility]: [facility];  Service: Endoscopy;  Laterality: N/A;  12:00  . INCISION AND DRAINAGE PERIRECTAL ABSCESS    . LYMPH NODE DISSECTION Left    '05  . PORT-A-CATH REMOVAL    . PORTACATH PLACEMENT     insertion and removal -last chemotherapy 10 yrs ago  . TRANSURETHRAL RESECTION OF PROSTATE N/A 08/10/2013   Procedure: TRANSURETHRAL RESECTION OF THE PROSTATE WITH GYRUS INSTRUMENTS;  Surgeon: Stephen Dahlstedt, MD;    Location: [facility];  Service: Urology;  Laterality: N/A;    Family History  Problem Relation Age of Onset  . Heart attack Mother        died in her 69s  . Other Father 45       pneumonia - died  . Breast cancer Sister        dx in her 6s-60s  . Colon cancer Brother        dx in his 35s  . Cancer Sister        TAH/BSO due to "male cancer"  . Breast cancer Daughter 15  . Breast cancer Daughter 65       DCIS - ATM VUS on [employer] 83 gene panel  in 2018  . Breast cancer Daughter 26       DCIS - Negative on Myriad MyRisk 25 gene apnel in 2015  . Thyroid cancer Daughter 83       Medullary and Papillary  . Thyroid nodules Grandchild     SOCIAL HISTORY: Social History   Socioeconomic History  . Marital status: Married    Spouse name: Not on file  . Number of children: Not on file  . Years of education: Not on file  . Highest education level: Not on file  Social Needs  . Financial resource strain: Not on file  . Food insecurity - worry: Not on file  . Food insecurity - inability: Not on file  . Transportation needs - medical: Not on file  . Transportation needs - non-medical: Not on file  Occupational History  . Occupation: retired    Fish farm manager: RETIRED    Comment: salesman  Tobacco Use  . Smoking status: Never Smoker  . Smokeless tobacco: Never Used  Substance and Sexual Activity  . Alcohol use: No  . Drug use: No  . Sexual activity: Not Currently  Other Topics Concern  . Not on file  Social History Narrative  . Not on file    No Known Allergies  Current Outpatient Medications  Medication Sig Dispense Refill  . abiraterone acetate (ZYTIGA) 250 MG tablet Take 4 tablets (1,000 mg total) by mouth daily. Take on an empty stomach 1 hour before or 2 hours after a meal 120 tablet 6  . acetaminophen (TYLENOL) 500 MG tablet Take 500-1,000 mg by mouth daily as needed for mild pain, fever or headache.    . Calcium Carb-Cholecalciferol 600-800 MG-UNIT TABS Take 1 capsule by mouth daily.     . Cholecalciferol (VITAMIN D3) 1000 units CAPS Take 1,000 Units by mouth daily.    . clotrimazole-betamethasone (LOTRISONE) cream Apply 1 application topically daily as needed (fungus).     . CRANBERRY PO Take 4,200 mg by mouth daily.    . cyanocobalamin (,VITAMIN B-12,) 1000 MCG/ML injection Inject 1,000 mcg every 30 (thirty) days into the skin.    Marland Kitchen denosumab (XGEVA) 120 MG/1.7ML SOLN injection Inject 120 mg into the skin every 30  (thirty) days.     Marland Kitchen diltiazem (CARDIZEM) 90 MG tablet TAKE ONE TABLET BY MOUTH TWICE DAILY 180 tablet 2  . docusate sodium (COLACE) 100 MG capsule Take 100 mg by mouth daily as needed for mild constipation.    . fludrocortisone (FLORINEF) 0.1 MG tablet Take 1 tablet (0.1 mg total) daily by mouth. 30 tablet 11  . furosemide (LASIX) 20 MG tablet Take 1 tablet (20 mg total) by mouth daily. 30 tablet 11  . glipiZIDE (GLUCOTROL XL) 5 MG 24 hr tablet Take 5  mg by mouth daily with breakfast.     . guaiFENesin (MUCINEX) 600 MG 12 hr tablet Take 1 tablet (600 mg total) by mouth 2 (two) times daily. 30 tablet 0  . KLOR-CON M10 10 MEQ tablet TAKE 1 TABLET BY MOUTH ONCE DAILY 90 tablet 1  . Leuprolide Acetate, 6 Month, (LUPRON DEPOT) 45 MG injection Inject 45 mg into the muscle every 6 (six) months.    . lisinopril (PRINIVIL,ZESTRIL) 20 MG tablet Take 1 tablet (20 mg total) daily by mouth. 30 tablet 11  . loperamide (IMODIUM) 2 MG capsule Take 2 mg by mouth as needed for diarrhea or loose stools.    . metFORMIN (GLUCOPHAGE) 500 MG tablet Take 500 mg by mouth 2 (two) times daily with a meal.     . metFORMIN (GLUCOPHAGE) 850 MG tablet     . nystatin (MYCOSTATIN) powder Apply topically 4 (four) times daily as needed (fungus).     . omeprazole (PRILOSEC) 20 MG capsule Take 20 mg by mouth daily.     . potassium chloride (K-DUR,KLOR-CON) 10 MEQ tablet Take 2 tablets (20 mEq total) by mouth daily. 60 tablet 0  . predniSONE (DELTASONE) 5 MG tablet Take 1 tablet (5 mg total) by mouth 2 (two) times daily with a meal. 30 tablet 2  . sennosides-docusate sodium (SENOKOT-S) 8.6-50 MG tablet Take 1 tablet by mouth daily.    . VENTOLIN HFA 108 (90 Base) MCG/ACT inhaler Inhale 1-2 puffs into the lungs every 6 (six) hours as needed.     . warfarin (COUMADIN) 2.5 MG tablet Take as directed by [facility]. (Patient taking differently: Take 2.5-5 mg by mouth See admin instructions. Take 2.5mg on Saturdays and take 5mg on all  other days) 160 tablet 0   No current facility-administered medications for this visit.     ROS:   General:  No weight loss, Fever, chills  HEENT: No recent headaches, no nasal bleeding, no visual changes, no sore throat  Neurologic: No dizziness, blackouts, seizures. No recent symptoms of stroke or mini- stroke. No recent episodes of slurred speech, or temporary blindness.  Cardiac: No recent episodes of chest pain/pressure, no shortness of breath at rest.  No shortness of breath with exertion.  Denies history of atrial fibrillation or irregular heartbeat  Vascular: No history of rest pain in feet.  No history of claudication.  No history of non-healing ulcer, No history of DVT   Pulmonary: No home oxygen, no productive cough, no hemoptysis,  No asthma or wheezing  Musculoskeletal:  [X] Arthritis, [X] Low back pain,  [X] Joint pain  Hematologic:No history of hypercoagulable state.  No history of easy bleeding.  No history of anemia  Gastrointestinal: No hematochezia or melena,  No gastroesophageal reflux, no trouble swallowing  Urinary: [ ] chronic Kidney disease, [ ] on HD - [ ] MWF or [ ] TTHS, [ ] Burning with urination, [ ] Frequent urination, [ ] Difficulty urinating;   Skin: No rashes  Psychological: No history of anxiety,  No history of depression   Physical Examination  Vitals:   06/21/17 1412  BP: 140/74  Pulse: 61  Resp: (!) 22  Temp: 98.2 F (36.8 C)  SpO2: 98%  Weight: 163 lb (73.9 kg)  Height: 5' 10" (1.778 m)    Body mass index is 23.39 kg/m.  General:  Alert and oriented, no acute distress HEENT: Normal Neck: No bruit or JVD Pulmonary: Clear to auscultation bilaterally Cardiac: Irregularly irregular without murmur Abdomen:   Soft, non-tender, non-distended, no mass Skin: No rash, ulceration left first toe with some erythema in the forefoot and medial calf left side.  See pictures below Extremity Pulses:  2+ radial, brachial, femoral, 2+ popliteal  absent dorsalis pedis, posterior tibial pulses bilaterally Musculoskeletal: No deformity trace left pedal edema  Neurologic: Upper and lower extremity motor 5/5 and symmetric  DATA:  Patient had bilateral ABIs performed today which were calcified and noncompressible bilaterally.  Toe pressure on the right side was 88.  We were unable to obtain a toe pressure on the left side secondary to the ulcer.  ASSESSMENT: Nonhealing wound left first toe with osteomyelitis.  Patient also has evidence of peripheral arterial disease.  Most likely this is tibial and popliteal occlusive disease.  PLAN: Abdominal aortogram with bilateral lower extremity runoff possible intervention this Friday, June 25, 2017.  We will stop his warfarin after his dose this evening.  I am also going to transition him to Augmentin.  Depending on the findings of his arteriogram on Friday will make a determination on whether or not he needs revascularization prior to toe amputation.  I will discussed this with Dr. Durward Fortes later in the week.   Ruta Hinds, MD [facility] Office: [phone] Pager: [phone]

## 2017-06-21 NOTE — Patient Instructions (Signed)
Continue taking 2 tablets daily except 1 tablet on Tuesdays and Fridays.   Has 3 days of doxycycline left. Pending Vascular testing today and appt with Dr Durward Fortes 4/6.  Has Osteo in toe.  May need surgery.

## 2017-06-22 ENCOUNTER — Other Ambulatory Visit: Payer: Self-pay

## 2017-06-22 ENCOUNTER — Other Ambulatory Visit: Payer: Self-pay | Admitting: *Deleted

## 2017-06-22 DIAGNOSIS — M869 Osteomyelitis, unspecified: Secondary | ICD-10-CM

## 2017-06-22 MED ORDER — AMOXICILLIN-POT CLAVULANATE 875-125 MG PO TABS: 1.0000 | ORAL_TABLET | Freq: Two times a day (BID) | ORAL | 0 refills | Status: DC

## 2017-06-23 ENCOUNTER — Encounter (INDEPENDENT_AMBULATORY_CARE_PROVIDER_SITE_OTHER): Payer: Self-pay

## 2017-06-23 ENCOUNTER — Ambulatory Visit (INDEPENDENT_AMBULATORY_CARE_PROVIDER_SITE_OTHER): Payer: Medicare Other | Admitting: Orthopaedic Surgery

## 2017-06-23 ENCOUNTER — Telehealth: Payer: Self-pay | Admitting: *Deleted

## 2017-06-23 NOTE — Telephone Encounter (Signed)
-----   Message from Arnoldo Lenis, MD sent at 06/23/2017 10:28 AM EST ----- Regarding: RE: Medication Clearance No lovenox bridging required, may hold coumadin as recommended by vascular for the procedure.   Zandra Abts MD ----- Message ----- From: Willy Eddy, RN Sent: 06/22/2017  10:16 AM To: Rosita Fire, MD, Arnoldo Lenis, MD Subject: Medication Clearance                           Requesting clearance for Coumadin. Patient is scheduled for Aortogram with Dr. Ruta Hinds on 06/25/17. Will need to be off coumadin and Was instructed by Dr. Oneida Alar to hold past 06/21/17 dose for this procedure. Please advise and contact patient  If Lovenox bridge is needed and cleared to proceed. Thank you, Archivist

## 2017-06-25 ENCOUNTER — Encounter (HOSPITAL_COMMUNITY)
Admission: RE | Disposition: A | Discharge status: Home / Self Care | Payer: Self-pay | Source: Ambulatory Visit | Attending: Vascular Surgery

## 2017-06-25 ENCOUNTER — Other Ambulatory Visit: Payer: Self-pay | Admitting: *Deleted

## 2017-06-25 ENCOUNTER — Ambulatory Visit (HOSPITAL_COMMUNITY)
Admission: RE | Admit: 2017-06-25 | Discharge: 2017-06-25 | Disposition: A | Discharge status: Home / Self Care | Payer: Medicare Other | Source: Ambulatory Visit | Attending: Vascular Surgery | Admitting: Vascular Surgery

## 2017-06-25 DIAGNOSIS — I4891 Unspecified atrial fibrillation: Secondary | ICD-10-CM | POA: Diagnosis not present

## 2017-06-25 DIAGNOSIS — Z7984 Long term (current) use of oral hypoglycemic drugs: Secondary | ICD-10-CM | POA: Diagnosis not present

## 2017-06-25 DIAGNOSIS — Z7952 Long term (current) use of systemic steroids: Secondary | ICD-10-CM | POA: Diagnosis not present

## 2017-06-25 DIAGNOSIS — E11621 Type 2 diabetes mellitus with foot ulcer: Secondary | ICD-10-CM | POA: Diagnosis not present

## 2017-06-25 DIAGNOSIS — M109 Gout, unspecified: Secondary | ICD-10-CM | POA: Diagnosis not present

## 2017-06-25 DIAGNOSIS — I70245 Atherosclerosis of native arteries of left leg with ulceration of other part of foot: Secondary | ICD-10-CM | POA: Insufficient documentation

## 2017-06-25 DIAGNOSIS — E1151 Type 2 diabetes mellitus with diabetic peripheral angiopathy without gangrene: Secondary | ICD-10-CM | POA: Diagnosis not present

## 2017-06-25 DIAGNOSIS — Z7901 Long term (current) use of anticoagulants: Secondary | ICD-10-CM | POA: Insufficient documentation

## 2017-06-25 DIAGNOSIS — L97529 Non-pressure chronic ulcer of other part of left foot with unspecified severity: Secondary | ICD-10-CM | POA: Diagnosis not present

## 2017-06-25 DIAGNOSIS — I10 Essential (primary) hypertension: Secondary | ICD-10-CM | POA: Insufficient documentation

## 2017-06-25 DIAGNOSIS — M869 Osteomyelitis, unspecified: Secondary | ICD-10-CM | POA: Diagnosis not present

## 2017-06-25 DIAGNOSIS — I70244 Atherosclerosis of native arteries of left leg with ulceration of heel and midfoot: Secondary | ICD-10-CM | POA: Diagnosis not present

## 2017-06-25 DIAGNOSIS — Z8249 Family history of ischemic heart disease and other diseases of the circulatory system: Secondary | ICD-10-CM | POA: Insufficient documentation

## 2017-06-25 HISTORY — PX: LOWER EXTREMITY ANGIOGRAPHY: CATH118251

## 2017-06-25 LAB — POCT I-STAT, CHEM 8
BUN: 17 mg/dL (ref 6–20)
Calcium, Ion: 1.27 mmol/L (ref 1.15–1.40)
Chloride: 99 mmol/L — ABNORMAL LOW (ref 101–111)
Creatinine, Ser: 0.6 mg/dL — ABNORMAL LOW (ref 0.61–1.24)
Glucose, Bld: 195 mg/dL — ABNORMAL HIGH (ref 65–99)
HCT: 36 % — ABNORMAL LOW (ref 39.0–52.0)
Hemoglobin: 12.2 g/dL — ABNORMAL LOW (ref 13.0–17.0)
Potassium: 3.9 mmol/L (ref 3.5–5.1)
Sodium: 140 mmol/L (ref 135–145)
TCO2: 28 mmol/L (ref 22–32)

## 2017-06-25 LAB — PROTIME-INR
INR: 1.5
Prothrombin Time: 18 seconds — ABNORMAL HIGH (ref 11.4–15.2)

## 2017-06-25 SURGERY — LOWER EXTREMITY ANGIOGRAPHY
Anesthesia: LOCAL

## 2017-06-25 MED ORDER — SODIUM CHLORIDE 0.9% FLUSH: 3.0000 mL | Freq: Two times a day (BID) | INTRAVENOUS | Status: DC

## 2017-06-25 MED ORDER — HEPARIN (PORCINE) IN NACL 2-0.9 UNIT/ML-% IJ SOLN
INTRAMUSCULAR | Status: AC | PRN
  Administered 2017-06-25: 500 mL

## 2017-06-25 MED ORDER — HYDRALAZINE HCL 20 MG/ML IJ SOLN: 5.0000 mg | INTRAMUSCULAR | Status: DC | PRN

## 2017-06-25 MED ORDER — SODIUM CHLORIDE 0.9 % IV SOLN
INTRAVENOUS | Status: DC
  Administered 2017-06-25: 13:00:00 via INTRAVENOUS

## 2017-06-25 MED ORDER — OXYCODONE HCL 5 MG PO TABS: 5.0000 mg | ORAL_TABLET | ORAL | Status: DC | PRN

## 2017-06-25 MED ORDER — LIDOCAINE HCL (PF) 1 % IJ SOLN
INTRAMUSCULAR | Status: DC | PRN
  Administered 2017-06-25: 22 mL

## 2017-06-25 MED ORDER — SODIUM CHLORIDE 0.9% FLUSH: 3.0000 mL | INTRAVENOUS | Status: DC | PRN

## 2017-06-25 MED ORDER — LABETALOL HCL 5 MG/ML IV SOLN
INTRAVENOUS | Status: DC | PRN
  Administered 2017-06-25: 10 mg via INTRAVENOUS

## 2017-06-25 MED ORDER — SODIUM CHLORIDE 0.9 % IV SOLN: 250.0000 mL | INTRAVENOUS | Status: DC | PRN

## 2017-06-25 MED ORDER — IODIXANOL 320 MG/ML IV SOLN
INTRAVENOUS | Status: DC | PRN
Start: 2017-06-25 — End: 2017-06-25
  Administered 2017-06-25: 133 mL via INTRA_ARTERIAL

## 2017-06-25 MED ORDER — LABETALOL HCL 5 MG/ML IV SOLN: 10.0000 mg | INTRAVENOUS | Status: DC | PRN

## 2017-06-25 MED ORDER — SODIUM CHLORIDE 0.9 % IV SOLN: INTRAVENOUS | Status: AC

## 2017-06-25 MED ORDER — MORPHINE SULFATE (PF) 10 MG/ML IV SOLN: 2.0000 mg | INTRAVENOUS | Status: DC | PRN

## 2017-06-25 MED ORDER — LIDOCAINE HCL 1 % IJ SOLN
INTRAMUSCULAR | Status: AC
  Filled 2017-06-25: qty 20

## 2017-06-25 MED ORDER — WARFARIN SODIUM 2.5 MG PO TABS: ORAL_TABLET | ORAL | 0 refills | Status: DC

## 2017-06-25 MED ORDER — LABETALOL HCL 5 MG/ML IV SOLN
INTRAVENOUS | Status: AC
  Filled 2017-06-25: qty 4

## 2017-06-25 MED ORDER — HEPARIN (PORCINE) IN NACL 2-0.9 UNIT/ML-% IJ SOLN
INTRAMUSCULAR | Status: AC
  Filled 2017-06-25: qty 1000

## 2017-06-25 SURGICAL SUPPLY — 14 items
CATH ANGIO 5F PIGTAIL 65CM (CATHETERS) ×1 IMPLANT
CATH CROSS OVER TEMPO 5F (CATHETERS) ×1 IMPLANT
CATH STRAIGHT 5FR 65CM (CATHETERS) ×1 IMPLANT
COVER PRB 48X5XTLSCP FOLD TPE (BAG) IMPLANT
COVER PROBE 5X48 (BAG) ×3
DEVICE TORQUE .025-.038 (MISCELLANEOUS) ×1 IMPLANT
GUIDEWIRE ANGLED .035X150CM (WIRE) ×1 IMPLANT
KIT PV (KITS) ×3 IMPLANT
SHEATH PINNACLE 5F 10CM (SHEATH) ×1 IMPLANT
SYR MEDRAD MARK V 150ML (SYRINGE) ×3 IMPLANT
TRANSDUCER W/STOPCOCK (MISCELLANEOUS) ×3 IMPLANT
TRAY PV CATH (CUSTOM PROCEDURE TRAY) ×3 IMPLANT
TUBING CIL FLEX 10 FLL-RA (TUBING) ×1 IMPLANT
WIRE BENTSON .035X145CM (WIRE) ×1 IMPLANT

## 2017-06-25 NOTE — Op Note (Addendum)
Procedure: Abdominal aortogram with bilateral lower extremity runoff  Preoperative diagnosis: Nonhealing wound left foot  Postoperative diagnosis: Same  Anesthesia: Local  Operative findings: Patent three-vessel runoff with calcification bilaterally  Operative details: After obtaining informed consent, the patient was taken the PV lab.  The patient was placed in supine position Angio table.  Both groins were prepped and draped in usual sterile fashion.  Ultrasound was used to identify the right common femoral artery and femoral bifurcation.  Local anesthesia was infiltrated over this.  An introducer needle was then used to cannulate the right common femoral artery and an 035 Bentson wire advanced into the abdominal aorta under fluoroscopic guidance.  Next a 5 French sheath was placed over the guidewire into the right common femoral artery.  This was thoroughly flushed with heparinized saline.  A 5 French pigtail catheter was then placed over the guidewire into the abdominal aorta and abdominal aortogram was obtained in AP projection.  The infrarenal abdominal aorta is patent.  The left and right renal arteries are patent.  The left and right common external and internal iliac arteries are all widely patent.  Next the pigtail catheter was pulled down just above the aortic bifurcation and an additional view was performed to confirm the above findings.  At this point bilateral lower extremity runoff views were obtained through the pigtail catheter.  In the right lower extremity, the right common femoral profunda femoris and superficial femoral arteries are widely patent.  The right popliteal artery is patent.  The tibial vessels are not as well opacified secondary to motion artifact.  But he appears to have all 3 tibial vessels intact with some calcification and some areas of stenosis within the posterior tibial artery but relatively good flow to the right leg.  In the left lower extremity there appears  to be similar findings.  However since the patient did have a wound on his foot I thought that we needed better views of the tibials the pigtail catheter was removed over the guidewire and exchanged for a 3 5 crossover catheter.  This was used to selectively catheterize the left common iliac artery.  An 035 angled Glidewire was then advanced all the way down into the left superficial femoral artery.  The crossover catheter was removed and exchanged for a 5 French straight catheter.  There was significant amount of friction due to calcification iliac vessels but with some manipulation I was able to advance the 5 straight catheter over the Glidewire down into the distal left external iliac artery.  Neck left lower extremity runoff views were obtained.  The left popliteal anterior tibial posterior tibial and peroneal arteries are all widely patent with a full arch into the foot.  At this point the 5 French straight catheter was pulled back over a guidewire and a 5 [place] sheath removed and hemostasis obtained with direct pressure.  The patient tolerated procedure well and there were no complications.  The patient was taken to the holding area in stable condition.  Operative management: The patient has adequate perfusion for wound healing of a toe amputation.  This will be scheduled for him in the near future.  Ruta Hinds, MD [facility] [place] Office: [phone] Pager: [phone]

## 2017-06-25 NOTE — Interval H&P Note (Signed)
History and Physical Interval Note:  06/25/2017 1:10 PM  [name]  has presented today for surgery, with the diagnosis of pad  The various methods of treatment have been discussed with the patient and family. After consideration of risks, benefits and other options for treatment, the patient has consented to  Procedure(s): LOWER EXTREMITY ANGIOGRAPHY (N/A) as a surgical intervention .  The patient's history has been reviewed, patient examined, no change in status, stable for surgery.  I have reviewed the patient's chart and labs.  Questions were answered to the patient's satisfaction.     Ruta Hinds

## 2017-06-25 NOTE — Discharge Instructions (Signed)

## 2017-06-28 ENCOUNTER — Encounter (HOSPITAL_COMMUNITY): Payer: Self-pay | Admitting: Vascular Surgery

## 2017-06-28 NOTE — Anesthesia Preprocedure Evaluation (Addendum)
Anesthesia Evaluation  Patient identified by MRN, date of birth, ID band Patient awake    Reviewed: Allergy & Precautions, NPO status , Patient's Chart, lab work & pertinent test results  Airway Mallampati: III  TM Distance: >3 FB Neck ROM: Full    Dental  (+) Partial Upper   Pulmonary neg pulmonary ROS,    Pulmonary exam normal breath sounds clear to auscultation       Cardiovascular hypertension, Pt. on medications Normal cardiovascular exam+ dysrhythmias Atrial Fibrillation  Rhythm:Regular Rate:Normal  ECG: A-fib, rate 94  ECHO: Left ventricle: The cavity size was normal. Wall thickness was normal. Systolic function was normal. The estimated ejection fraction was in the range of 60% to 65%. Left atrium: The atrium was mildly dilated. Right atrium: The atrium was mildly dilated.   Neuro/Psych negative neurological ROS  negative psych ROS   GI/Hepatic negative GI ROS, Neg liver ROS,   Endo/Other  diabetes, Oral Hypoglycemic Agents  Renal/GU negative Renal ROS     Musculoskeletal negative musculoskeletal ROS (+)   Abdominal   Peds  Hematology  (+) anemia ,   Anesthesia Other Findings ischemic toe  Reproductive/Obstetrics                            Anesthesia Physical Anesthesia Plan  ASA: III  Anesthesia Plan: MAC   Post-op Pain Management:    Induction: Intravenous  PONV Risk Score and Plan: 1 and Propofol infusion and Treatment may vary due to age or medical condition  Airway Management Planned: Natural Airway  Additional Equipment:   Intra-op Plan:   Post-operative Plan:   Informed Consent: I have reviewed the patients History and Physical, chart, labs and discussed the procedure including the risks, benefits and alternatives for the proposed anesthesia with the patient or authorized representative who has indicated his/her understanding and acceptance.   Dental  advisory given  Plan Discussed with: CRNA  Anesthesia Plan Comments:         Anesthesia Quick Evaluation

## 2017-06-29 ENCOUNTER — Encounter (HOSPITAL_COMMUNITY)
Admission: RE | Disposition: A | Discharge status: Home / Self Care | Payer: Self-pay | Source: Ambulatory Visit | Attending: Vascular Surgery

## 2017-06-29 ENCOUNTER — Ambulatory Visit (HOSPITAL_COMMUNITY): Payer: Medicare Other | Admitting: Anesthesiology

## 2017-06-29 ENCOUNTER — Encounter (HOSPITAL_COMMUNITY): Payer: Self-pay | Admitting: Certified Registered"

## 2017-06-29 ENCOUNTER — Observation Stay (HOSPITAL_COMMUNITY)
Admission: RE | Admit: 2017-06-29 | Discharge: 2017-06-30 | Disposition: A | Discharge status: Home / Self Care | Payer: Medicare Other | Source: Ambulatory Visit | Attending: Vascular Surgery | Admitting: Vascular Surgery

## 2017-06-29 ENCOUNTER — Other Ambulatory Visit: Payer: Self-pay

## 2017-06-29 DIAGNOSIS — L97519 Non-pressure chronic ulcer of other part of right foot with unspecified severity: Secondary | ICD-10-CM | POA: Diagnosis not present

## 2017-06-29 DIAGNOSIS — Z7901 Long term (current) use of anticoagulants: Secondary | ICD-10-CM | POA: Diagnosis not present

## 2017-06-29 DIAGNOSIS — M869 Osteomyelitis, unspecified: Secondary | ICD-10-CM

## 2017-06-29 DIAGNOSIS — I4891 Unspecified atrial fibrillation: Secondary | ICD-10-CM | POA: Insufficient documentation

## 2017-06-29 DIAGNOSIS — E1151 Type 2 diabetes mellitus with diabetic peripheral angiopathy without gangrene: Secondary | ICD-10-CM | POA: Diagnosis not present

## 2017-06-29 DIAGNOSIS — R2689 Other abnormalities of gait and mobility: Secondary | ICD-10-CM | POA: Diagnosis not present

## 2017-06-29 DIAGNOSIS — Z800 Family history of malignant neoplasm of digestive organs: Secondary | ICD-10-CM | POA: Diagnosis not present

## 2017-06-29 DIAGNOSIS — Z7984 Long term (current) use of oral hypoglycemic drugs: Secondary | ICD-10-CM | POA: Diagnosis not present

## 2017-06-29 DIAGNOSIS — I10 Essential (primary) hypertension: Secondary | ICD-10-CM | POA: Diagnosis not present

## 2017-06-29 DIAGNOSIS — M86371 Chronic multifocal osteomyelitis, right ankle and foot: Secondary | ICD-10-CM | POA: Diagnosis not present

## 2017-06-29 DIAGNOSIS — E1169 Type 2 diabetes mellitus with other specified complication: Secondary | ICD-10-CM | POA: Diagnosis not present

## 2017-06-29 DIAGNOSIS — Z79899 Other long term (current) drug therapy: Secondary | ICD-10-CM | POA: Insufficient documentation

## 2017-06-29 DIAGNOSIS — E11621 Type 2 diabetes mellitus with foot ulcer: Secondary | ICD-10-CM | POA: Diagnosis not present

## 2017-06-29 DIAGNOSIS — Z8572 Personal history of non-Hodgkin lymphomas: Secondary | ICD-10-CM | POA: Insufficient documentation

## 2017-06-29 DIAGNOSIS — C61 Malignant neoplasm of prostate: Secondary | ICD-10-CM | POA: Diagnosis not present

## 2017-06-29 DIAGNOSIS — Z7952 Long term (current) use of systemic steroids: Secondary | ICD-10-CM | POA: Diagnosis not present

## 2017-06-29 HISTORY — DX: Osteomyelitis, unspecified: M86.9

## 2017-06-29 HISTORY — PX: AMPUTATION: SHX166

## 2017-06-29 LAB — BASIC METABOLIC PANEL
Anion gap: 11 (ref 5–15)
BUN: 16 mg/dL (ref 6–20)
CO2: 24 mmol/L (ref 22–32)
Calcium: 9.5 mg/dL (ref 8.9–10.3)
Chloride: 103 mmol/L (ref 101–111)
Creatinine, Ser: 0.78 mg/dL (ref 0.61–1.24)
GFR calc Af Amer: >60 mL/min (ref >60)
GFR calc non Af Amer: >60 mL/min (ref >60)
Glucose, Bld: 321 mg/dL — ABNORMAL HIGH (ref 65–99)
Potassium: 4 mmol/L (ref 3.5–5.1)
Sodium: 138 mmol/L (ref 135–145)

## 2017-06-29 LAB — CBC
HCT: 35.6 % — ABNORMAL LOW (ref 39.0–52.0)
Hemoglobin: 12.3 g/dL — ABNORMAL LOW (ref 13.0–17.0)
MCH: 31.7 pg (ref 26.0–34.0)
MCHC: 34.6 g/dL (ref 30.0–36.0)
MCV: 91.8 fL (ref 78.0–100.0)
Platelets: 156 10*3/uL (ref 150–400)
RBC: 3.88 MIL/uL — ABNORMAL LOW (ref 4.22–5.81)
RDW: 14 % (ref 11.5–15.5)
WBC: 7.4 10*3/uL (ref 4.0–10.5)

## 2017-06-29 LAB — GLUCOSE, CAPILLARY
Glucose-Capillary: 334 mg/dL — ABNORMAL HIGH (ref 65–99)
Glucose-Capillary: 249 mg/dL — ABNORMAL HIGH (ref 65–99)
Glucose-Capillary: 266 mg/dL — ABNORMAL HIGH (ref 65–99)
Glucose-Capillary: 203 mg/dL — ABNORMAL HIGH (ref 65–99)
Glucose-Capillary: 129 mg/dL — ABNORMAL HIGH (ref 65–99)

## 2017-06-29 LAB — HEMOGLOBIN A1C
Hgb A1c MFr Bld: 7.6 % — ABNORMAL HIGH (ref 4.8–5.6)
Mean Plasma Glucose: 171.42 mg/dL

## 2017-06-29 LAB — PROTIME-INR
INR: 1.01
Prothrombin Time: 13.2 seconds (ref 11.4–15.2)

## 2017-06-29 SURGERY — AMPUTATION DIGIT
Anesthesia: Monitor Anesthesia Care | Site: Foot | Laterality: Left

## 2017-06-29 MED ORDER — GLIPIZIDE ER 5 MG PO TB24
5.0000 mg | ORAL_TABLET | Freq: Every day | ORAL | Status: DC
  Administered 2017-06-30: 5 mg via ORAL
  Filled 2017-06-29: qty 1

## 2017-06-29 MED ORDER — PHENYLEPHRINE HCL 10 MG/ML IJ SOLN
INTRAMUSCULAR | Status: DC | PRN
  Administered 2017-06-29: 160 ug via INTRAVENOUS

## 2017-06-29 MED ORDER — LACTATED RINGERS IV SOLN
INTRAVENOUS | Status: DC | PRN
Start: 2017-06-29 — End: 2017-06-29
  Administered 2017-06-29: 07:00:00 via INTRAVENOUS

## 2017-06-29 MED ORDER — BUPIVACAINE HCL (PF) 0.25 % IJ SOLN
INTRAMUSCULAR | Status: AC
  Filled 2017-06-29: qty 30

## 2017-06-29 MED ORDER — ZINC OXIDE 40 % EX OINT
TOPICAL_OINTMENT | Freq: Two times a day (BID) | CUTANEOUS | Status: DC | PRN
  Filled 2017-06-29: qty 114

## 2017-06-29 MED ORDER — CHLORHEXIDINE GLUCONATE 4 % EX LIQD: 60.0000 mL | Freq: Once | CUTANEOUS | Status: DC

## 2017-06-29 MED ORDER — OXYCODONE HCL 5 MG PO TABS: 5.0000 mg | ORAL_TABLET | ORAL | Status: DC | PRN

## 2017-06-29 MED ORDER — WARFARIN - PHYSICIAN DOSING INPATIENT
Freq: Every day | Status: DC
Start: 2017-06-29 — End: 2017-06-30

## 2017-06-29 MED ORDER — DILTIAZEM HCL 60 MG PO TABS
90.0000 mg | ORAL_TABLET | Freq: Two times a day (BID) | ORAL | Status: DC
  Administered 2017-06-29 – 2017-06-30 (×2): 90 mg via ORAL
  Filled 2017-06-29 (×3): qty 2

## 2017-06-29 MED ORDER — FUROSEMIDE 20 MG PO TABS
20.0000 mg | ORAL_TABLET | Freq: Every day | ORAL | Status: DC
  Administered 2017-06-30: 20 mg via ORAL
  Filled 2017-06-29: qty 1

## 2017-06-29 MED ORDER — PREDNISONE 5 MG PO TABS
5.0000 mg | ORAL_TABLET | Freq: Two times a day (BID) | ORAL | Status: DC
  Administered 2017-06-29 – 2017-06-30 (×2): 5 mg via ORAL
  Filled 2017-06-29 (×2): qty 1

## 2017-06-29 MED ORDER — LIDOCAINE HCL (PF) 1 % IJ SOLN
INTRAMUSCULAR | Status: AC
  Filled 2017-06-29: qty 30

## 2017-06-29 MED ORDER — LIDOCAINE-EPINEPHRINE (PF) 1 %-1:200000 IJ SOLN
INTRAMUSCULAR | Status: AC
  Filled 2017-06-29: qty 30

## 2017-06-29 MED ORDER — LOPERAMIDE HCL 2 MG PO CAPS: 2.0000 mg | ORAL_CAPSULE | ORAL | Status: DC | PRN

## 2017-06-29 MED ORDER — WARFARIN SODIUM 2.5 MG PO TABS
2.5000 mg | ORAL_TABLET | Freq: Every day | ORAL | Status: DC
  Administered 2017-06-29: 2.5 mg via ORAL
  Filled 2017-06-29: qty 1

## 2017-06-29 MED ORDER — HYDRALAZINE HCL 20 MG/ML IJ SOLN: 5.0000 mg | INTRAMUSCULAR | Status: DC | PRN

## 2017-06-29 MED ORDER — FENTANYL CITRATE (PF) 100 MCG/2ML IJ SOLN: 25.0000 ug | INTRAMUSCULAR | Status: DC | PRN

## 2017-06-29 MED ORDER — INSULIN ASPART 100 UNIT/ML ~~LOC~~ SOLN
0.0000 [IU] | SUBCUTANEOUS | Status: DC
  Administered 2017-06-29: 5 [IU] via SUBCUTANEOUS
  Administered 2017-06-29: 8 [IU] via SUBCUTANEOUS
  Administered 2017-06-29: 2 [IU] via SUBCUTANEOUS
  Administered 2017-06-30: 3 [IU] via SUBCUTANEOUS
  Administered 2017-06-30: 5 [IU] via SUBCUTANEOUS

## 2017-06-29 MED ORDER — LABETALOL HCL 5 MG/ML IV SOLN: 10.0000 mg | INTRAVENOUS | Status: DC | PRN

## 2017-06-29 MED ORDER — FLUDROCORTISONE ACETATE 0.1 MG PO TABS
0.1000 mg | ORAL_TABLET | Freq: Every day | ORAL | Status: DC
  Administered 2017-06-30: 0.1 mg via ORAL
  Filled 2017-06-29: qty 1

## 2017-06-29 MED ORDER — LEUPROLIDE ACETATE (6 MONTH) 45 MG IM KIT: 45.0000 mg | PACK | INTRAMUSCULAR | Status: DC

## 2017-06-29 MED ORDER — AMOXICILLIN-POT CLAVULANATE 875-125 MG PO TABS
1.0000 | ORAL_TABLET | Freq: Two times a day (BID) | ORAL | Status: DC
  Filled 2017-06-29: qty 1

## 2017-06-29 MED ORDER — DIPHENHYDRAMINE HCL 25 MG PO TABS
25.0000 mg | ORAL_TABLET | Freq: Four times a day (QID) | ORAL | Status: DC | PRN
  Filled 2017-06-29: qty 1

## 2017-06-29 MED ORDER — PROPOFOL 500 MG/50ML IV EMUL
INTRAVENOUS | Status: DC | PRN
  Administered 2017-06-29: 75 ug/kg/min via INTRAVENOUS

## 2017-06-29 MED ORDER — METFORMIN HCL 850 MG PO TABS
850.0000 mg | ORAL_TABLET | Freq: Two times a day (BID) | ORAL | Status: DC
  Administered 2017-06-29 – 2017-06-30 (×2): 850 mg via ORAL
  Filled 2017-06-29 (×3): qty 1

## 2017-06-29 MED ORDER — INSULIN ASPART 100 UNIT/ML ~~LOC~~ SOLN: 5.0000 [IU] | Freq: Once | SUBCUTANEOUS | Status: DC

## 2017-06-29 MED ORDER — VITAMIN D 1000 UNITS PO TABS
1000.0000 [IU] | ORAL_TABLET | Freq: Every day | ORAL | Status: DC
  Administered 2017-06-29 – 2017-06-30 (×2): 1000 [IU] via ORAL
  Filled 2017-06-29 (×2): qty 1

## 2017-06-29 MED ORDER — CYANOCOBALAMIN 1000 MCG/ML IJ SOLN: 1000.0000 ug | INTRAMUSCULAR | Status: DC

## 2017-06-29 MED ORDER — SODIUM CHLORIDE 0.9% FLUSH: 3.0000 mL | INTRAVENOUS | Status: DC | PRN

## 2017-06-29 MED ORDER — PANTOPRAZOLE SODIUM 40 MG PO TBEC: 40.0000 mg | DELAYED_RELEASE_TABLET | Freq: Every day | ORAL | Status: DC

## 2017-06-29 MED ORDER — PROPOFOL 10 MG/ML IV BOLUS
INTRAVENOUS | Status: DC | PRN
  Administered 2017-06-29: 10 mg via INTRAVENOUS
  Administered 2017-06-29: 20 mg via INTRAVENOUS

## 2017-06-29 MED ORDER — PHENOL 1.4 % MT LIQD: 1.0000 | OROMUCOSAL | Status: DC | PRN

## 2017-06-29 MED ORDER — POTASSIUM CHLORIDE CRYS ER 20 MEQ PO TBCR
20.0000 meq | EXTENDED_RELEASE_TABLET | Freq: Every day | ORAL | Status: DC
  Administered 2017-06-29 – 2017-06-30 (×2): 20 meq via ORAL
  Filled 2017-06-29 (×2): qty 1

## 2017-06-29 MED ORDER — EPHEDRINE SULFATE-NACL 50-0.9 MG/10ML-% IV SOSY
PREFILLED_SYRINGE | INTRAVENOUS | Status: DC | PRN
  Administered 2017-06-29: 15 mg via INTRAVENOUS
  Administered 2017-06-29 (×2): 10 mg via INTRAVENOUS

## 2017-06-29 MED ORDER — ONDANSETRON HCL 4 MG/2ML IJ SOLN: 4.0000 mg | Freq: Once | INTRAMUSCULAR | Status: DC | PRN

## 2017-06-29 MED ORDER — SODIUM CHLORIDE 0.9 % IV SOLN: INTRAVENOUS | Status: DC

## 2017-06-29 MED ORDER — LISINOPRIL 10 MG PO TABS
20.0000 mg | ORAL_TABLET | Freq: Every day | ORAL | Status: DC
  Administered 2017-06-29 – 2017-06-30 (×2): 20 mg via ORAL
  Filled 2017-06-29 (×2): qty 2

## 2017-06-29 MED ORDER — CEFAZOLIN SODIUM-DEXTROSE 2-4 GM/100ML-% IV SOLN
2.0000 g | INTRAVENOUS | Status: AC
  Administered 2017-06-29: 2 g via INTRAVENOUS

## 2017-06-29 MED ORDER — PANTOPRAZOLE SODIUM 40 MG PO TBEC
40.0000 mg | DELAYED_RELEASE_TABLET | Freq: Every day | ORAL | Status: DC
  Administered 2017-06-30: 40 mg via ORAL
  Filled 2017-06-29: qty 1

## 2017-06-29 MED ORDER — 0.9 % SODIUM CHLORIDE (POUR BTL) OPTIME
TOPICAL | Status: DC | PRN
  Administered 2017-06-29: 1000 mL

## 2017-06-29 MED ORDER — INSULIN ASPART 100 UNIT/ML ~~LOC~~ SOLN
SUBCUTANEOUS | Status: AC
  Filled 2017-06-29: qty 1

## 2017-06-29 MED ORDER — NYSTATIN 100000 UNIT/GM EX POWD
1.0000 g | Freq: Four times a day (QID) | CUTANEOUS | Status: DC | PRN
  Filled 2017-06-29: qty 15

## 2017-06-29 MED ORDER — ALBUTEROL SULFATE (2.5 MG/3ML) 0.083% IN NEBU: 2.5000 mg | INHALATION_SOLUTION | Freq: Four times a day (QID) | RESPIRATORY_TRACT | Status: DC | PRN

## 2017-06-29 MED ORDER — ABIRATERONE ACETATE 250 MG PO TABS
1000.0000 mg | ORAL_TABLET | Freq: Every day | ORAL | Status: DC
  Administered 2017-06-29 – 2017-06-30 (×2): 1000 mg via ORAL
  Filled 2017-06-29 (×3): qty 4

## 2017-06-29 MED ORDER — SENNOSIDES-DOCUSATE SODIUM 8.6-50 MG PO TABS
1.0000 | ORAL_TABLET | Freq: Every day | ORAL | Status: DC | PRN
  Administered 2017-06-29: 1 via ORAL
  Filled 2017-06-29 (×2): qty 1

## 2017-06-29 MED ORDER — EPHEDRINE 5 MG/ML INJ
INTRAVENOUS | Status: AC
  Filled 2017-06-29: qty 10

## 2017-06-29 MED ORDER — FENTANYL CITRATE (PF) 250 MCG/5ML IJ SOLN
INTRAMUSCULAR | Status: AC
  Filled 2017-06-29: qty 5

## 2017-06-29 MED ORDER — METOPROLOL TARTRATE 5 MG/5ML IV SOLN: 2.0000 mg | INTRAVENOUS | Status: DC | PRN

## 2017-06-29 MED ORDER — AMITRIPTYLINE HCL 50 MG PO TABS
25.0000 mg | ORAL_TABLET | Freq: Every day | ORAL | Status: DC
  Administered 2017-06-29: 25 mg via ORAL
  Filled 2017-06-29: qty 1

## 2017-06-29 MED ORDER — PHENYLEPH-SHARK LIV OIL-MO-PET 0.25-3-14-71.9 % RE OINT: 1.0000 "application " | TOPICAL_OINTMENT | Freq: Two times a day (BID) | RECTAL | Status: DC | PRN

## 2017-06-29 MED ORDER — GUAIFENESIN-DM 100-10 MG/5ML PO SYRP: 15.0000 mL | ORAL_SOLUTION | ORAL | Status: DC | PRN

## 2017-06-29 MED ORDER — ACETAMINOPHEN 500 MG PO TABS: 500.0000 mg | ORAL_TABLET | Freq: Every day | ORAL | Status: DC | PRN

## 2017-06-29 MED ORDER — PROPOFOL 10 MG/ML IV BOLUS
INTRAVENOUS | Status: AC
Start: 2017-06-29 — End: 2017-06-29
  Filled 2017-06-29: qty 20

## 2017-06-29 MED ORDER — LIDOCAINE HCL (PF) 1 % IJ SOLN
INTRAMUSCULAR | Status: DC | PRN
  Administered 2017-06-29: 13 mL

## 2017-06-29 MED ORDER — SODIUM CHLORIDE 0.9% FLUSH
3.0000 mL | Freq: Two times a day (BID) | INTRAVENOUS | Status: DC
  Administered 2017-06-29: 3 mL via INTRAVENOUS

## 2017-06-29 MED ORDER — GUAIFENESIN ER 600 MG PO TB12
600.0000 mg | ORAL_TABLET | Freq: Two times a day (BID) | ORAL | Status: DC
  Administered 2017-06-29 – 2017-06-30 (×2): 600 mg via ORAL
  Filled 2017-06-29 (×2): qty 1

## 2017-06-29 MED ORDER — DOCUSATE SODIUM 100 MG PO CAPS: 100.0000 mg | ORAL_CAPSULE | Freq: Every day | ORAL | Status: DC | PRN

## 2017-06-29 MED ORDER — CEFAZOLIN SODIUM-DEXTROSE 2-4 GM/100ML-% IV SOLN
INTRAVENOUS | Status: AC
  Filled 2017-06-29: qty 100

## 2017-06-29 MED ORDER — ACETAMINOPHEN 325 MG PO TABS
325.0000 mg | ORAL_TABLET | ORAL | Status: DC | PRN
  Administered 2017-06-30 (×2): 650 mg via ORAL
  Filled 2017-06-29 (×2): qty 2

## 2017-06-29 MED ORDER — INSULIN ASPART 100 UNIT/ML ~~LOC~~ SOLN
5.0000 [IU] | Freq: Once | SUBCUTANEOUS | Status: AC
  Administered 2017-06-29: 5 [IU] via SUBCUTANEOUS

## 2017-06-29 MED ORDER — ACETAMINOPHEN 650 MG RE SUPP: 325.0000 mg | RECTAL | Status: DC | PRN

## 2017-06-29 MED ORDER — MIDAZOLAM HCL 2 MG/2ML IJ SOLN
INTRAMUSCULAR | Status: AC
  Filled 2017-06-29: qty 2

## 2017-06-29 MED ORDER — LIDOCAINE HCL (CARDIAC) 20 MG/ML IV SOLN
INTRAVENOUS | Status: AC
  Filled 2017-06-29: qty 5

## 2017-06-29 MED ORDER — DENOSUMAB 120 MG/1.7ML ~~LOC~~ SOLN: 120.0000 mg | SUBCUTANEOUS | Status: DC

## 2017-06-29 MED ORDER — SODIUM CHLORIDE 0.9 % IV SOLN: 250.0000 mL | INTRAVENOUS | Status: DC | PRN

## 2017-06-29 MED ORDER — MORPHINE SULFATE (PF) 4 MG/ML IV SOLN: 2.0000 mg | INTRAVENOUS | Status: DC | PRN

## 2017-06-29 MED ORDER — POTASSIUM CHLORIDE CRYS ER 20 MEQ PO TBCR: 20.0000 meq | EXTENDED_RELEASE_TABLET | Freq: Once | ORAL | Status: DC

## 2017-06-29 MED ORDER — BUPIVACAINE HCL (PF) 0.25 % IJ SOLN
INTRAMUSCULAR | Status: DC | PRN
  Administered 2017-06-29: 13 mL

## 2017-06-29 MED ORDER — ONDANSETRON HCL 4 MG/2ML IJ SOLN: 4.0000 mg | Freq: Four times a day (QID) | INTRAMUSCULAR | Status: DC | PRN

## 2017-06-29 MED ORDER — FENTANYL CITRATE (PF) 100 MCG/2ML IJ SOLN
INTRAMUSCULAR | Status: DC | PRN
  Administered 2017-06-29: 50 ug via INTRAVENOUS

## 2017-06-29 MED ORDER — ALUM & MAG HYDROXIDE-SIMETH 200-200-20 MG/5ML PO SUSP: 15.0000 mL | ORAL | Status: DC | PRN

## 2017-06-29 SURGICAL SUPPLY — 35 items
BANDAGE ACE 4X5 VEL STRL LF (GAUZE/BANDAGES/DRESSINGS) ×2 IMPLANT
BNDG GAUZE ELAST 4 BULKY (GAUZE/BANDAGES/DRESSINGS) ×2 IMPLANT
CANISTER SUCT 3000ML PPV (MISCELLANEOUS) ×2 IMPLANT
COVER SURGICAL LIGHT HANDLE (MISCELLANEOUS) ×2 IMPLANT
DRAPE EXTREMITY T 121X128X90 (DRAPE) ×2 IMPLANT
DRAPE HALF SHEET 40X57 (DRAPES) ×2 IMPLANT
DRSG EMULSION OIL 3X3 NADH (GAUZE/BANDAGES/DRESSINGS) ×2 IMPLANT
ELECT REM PT RETURN 9FT ADLT (ELECTROSURGICAL) ×2
ELECTRODE REM PT RTRN 9FT ADLT (ELECTROSURGICAL) ×1 IMPLANT
GAUZE SPONGE 4X4 12PLY STRL (GAUZE/BANDAGES/DRESSINGS) ×2 IMPLANT
GAUZE SPONGE 4X4 12PLY STRL LF (GAUZE/BANDAGES/DRESSINGS) ×1 IMPLANT
GLOVE BIO SURGEON STRL SZ 6.5 (GLOVE) ×2 IMPLANT
GLOVE BIO SURGEON STRL SZ7.5 (GLOVE) ×2 IMPLANT
GLOVE BIOGEL PI IND STRL 7.0 (GLOVE) IMPLANT
GLOVE BIOGEL PI INDICATOR 7.0 (GLOVE) ×1
GLOVE SKINSENSE NS SZ7.0 (GLOVE) ×2
GLOVE SKINSENSE STRL SZ7.0 (GLOVE) IMPLANT
GOWN STRL REUS W/ TWL LRG LVL3 (GOWN DISPOSABLE) ×3 IMPLANT
GOWN STRL REUS W/TWL LRG LVL3 (GOWN DISPOSABLE) ×6
KIT BASIN OR (CUSTOM PROCEDURE TRAY) ×2 IMPLANT
KIT ROOM TURNOVER OR (KITS) ×2 IMPLANT
NDL HYPO 25GX1X1/2 BEV (NEEDLE) IMPLANT
NEEDLE HYPO 25GX1X1/2 BEV (NEEDLE) ×2 IMPLANT
NS IRRIG 1000ML POUR BTL (IV SOLUTION) ×2 IMPLANT
PACK GENERAL/GYN (CUSTOM PROCEDURE TRAY) ×2 IMPLANT
PAD ARMBOARD 7.5X6 YLW CONV (MISCELLANEOUS) ×4 IMPLANT
SPECIMEN JAR SMALL (MISCELLANEOUS) ×2 IMPLANT
SUT ETHILON 3 0 PS 1 (SUTURE) ×3 IMPLANT
SUT VIC AB 3-0 SH 27 (SUTURE)
SUT VIC AB 3-0 SH 27X BRD (SUTURE) IMPLANT
SYR CONTROL 10ML LL (SYRINGE) ×1 IMPLANT
TOWEL GREEN STERILE (TOWEL DISPOSABLE) ×4 IMPLANT
TOWEL GREEN STERILE FF (TOWEL DISPOSABLE) ×2 IMPLANT
UNDERPAD 30X30 (UNDERPADS AND DIAPERS) ×2 IMPLANT
WATER STERILE IRR 1000ML POUR (IV SOLUTION) ×2 IMPLANT

## 2017-06-29 NOTE — Progress Notes (Signed)
Inpatient Diabetes Program Recommendations  AACE/ADA: New Consensus Statement on Inpatient Glycemic Control (2015)  Target Ranges:  Prepandial:   less than 140 mg/dL      Peak postprandial:   less than 180 mg/dL (1-2 hours)      Critically ill patients:  140 - 180 mg/dL   Lab Results  Component Value Date   GLUCAP 334 (H) 06/29/2017   HGBA1C 7.6 (H) 06/29/2017    Review of Glycemic Control  Diabetes history: DM 2 Outpatient Diabetes medications: Glipizide 5 mg Daily, Metformin 850 mg BID Current orders for Inpatient glycemic control: One time dose Novolog 5 units, In OR  Inpatient Diabetes Program Recommendations:    Consider Novolog Moderate Correction 0-15 units tid + Novolog HS scale 0-5 units after surgery if admitted.  A1c 7.6% on 3/12  Thanks,  Tama Headings RN, MSN, BC-ADM, [facility] Inpatient Diabetes Coordinator Team Pager [phone] (8a-5p)

## 2017-06-29 NOTE — Op Note (Addendum)
Procedure: Amputation right first toe with resection of metatarsal head  Preoperative diagnosis: Osteomyelitis right first toe  Postoperative diagnosis: Same  Anesthesia: Digital block with sedation  Indications: Patient recently had revascularization of his right lower extremity. He was found on MRI scan to have osteomyelitis of his right first toe.   Specimens: Right first toe  Assistant: Nurse   Operative details: After obtaining informed consent, the patient was taken to the operating room. The patient was placed in supine position on the operating room table. The patient's entire right foot was prepped and draped in usual sterile fashion. A digital block was performed using 1% lidocaine with 1/4% Marcaine.  A circumferential incision was made at the base of the right first toe. The incision was carried all the way down to the level of the bone. There was some bleeding from digital vessels but minimal skin edge bleeding. This was controlled with cautery. The bone was transected with a bone cutter in the midportion of the proximal phalanx. The remainder of the proximal phalanx was debrided away with rongeurs. The metatarsal head cartilage was removed with rongeurs. The wound was thoroughly irrigated with normal saline solution. Hemostasis was obtained. Skin edges were reapproximated using interrupted 3-0 vertical mattress and simple nylon sutures. A dry sterile dressing was applied. The patient tolerated procedure well and there were no complications. Instrument sponge and needle counts were correct at the end of the case. The patient was taken to recovery in stable condition.   Ruta Hinds, MD  [facility] [place]  Office: [phone]  Pager: [phone]

## 2017-06-29 NOTE — Anesthesia Postprocedure Evaluation (Signed)
Anesthesia Post Note  Patient: [name]  Procedure(s) Performed: AMPUTATION LEFT GREAT TOE (Left Foot)     Patient location during evaluation: PACU Anesthesia Type: MAC Level of consciousness: awake and alert Pain management: pain level controlled Vital Signs Assessment: post-procedure vital signs reviewed and stable Respiratory status: spontaneous breathing, nonlabored ventilation, respiratory function stable and patient connected to nasal cannula oxygen Cardiovascular status: stable and blood pressure returned to baseline Postop Assessment: no apparent nausea or vomiting Anesthetic complications: no    Last Vitals:  Vitals:   06/29/17 1200 06/29/17 1256  BP: 140/74 (!) 153/80  Pulse: 82 71  Resp: (!) 21 18  Temp: 36.6 C 36.8 C  SpO2: 100% 100%    Last Pain:  Vitals:   06/29/17 1256  TempSrc: Oral  PainSc:                  Breah Joa P Demonica Farrey

## 2017-06-29 NOTE — Interval H&P Note (Signed)
History and Physical Interval Note:  06/29/2017 7:30 AM  [name]  has presented today for surgery, with the diagnosis of ischemic toe  The various methods of treatment have been discussed with the patient and family. After consideration of risks, benefits and other options for treatment, the patient has consented to  Procedure(s): AMPUTATION LEFT GREAT TOE (Left) as a surgical intervention .  The patient's history has been reviewed, patient examined, no change in status, stable for surgery.  I have reviewed the patient's chart and labs.  Questions were answered to the patient's satisfaction.     Ruta Hinds

## 2017-06-29 NOTE — Progress Notes (Signed)
notifed dr Roanna Banning of pts bs=[phone] no rx ordered at this time

## 2017-06-29 NOTE — Transfer of Care (Signed)
Immediate Anesthesia Transfer of Care Note  Patient: ABDULKARIM EBERLIN  Procedure(s) Performed: AMPUTATION LEFT GREAT TOE (Left Foot)  Patient Location: PACU  Anesthesia Type:MAC  Level of Consciousness: awake, oriented and patient cooperative  Airway & Oxygen Therapy: Patient Spontanous Breathing  Post-op Assessment: Report given to RN, Post -op Vital signs reviewed and stable and Patient moving all extremities  Post vital signs: Reviewed and stable  Last Vitals:  Vitals:   06/29/17 0615  BP: (!) 124/95  Pulse: 83  Resp: 16  Temp: 36.6 C  SpO2: 99%    Last Pain:  Vitals:   06/29/17 0705  TempSrc:   PainSc: 0-No pain      Patients Stated Pain Goal: 3 (75/64/33 2951)  Complications: No apparent anesthesia complications

## 2017-06-30 ENCOUNTER — Encounter (HOSPITAL_COMMUNITY): Payer: Self-pay | Admitting: Vascular Surgery

## 2017-06-30 DIAGNOSIS — L97519 Non-pressure chronic ulcer of other part of right foot with unspecified severity: Secondary | ICD-10-CM | POA: Diagnosis not present

## 2017-06-30 DIAGNOSIS — R2689 Other abnormalities of gait and mobility: Secondary | ICD-10-CM | POA: Diagnosis not present

## 2017-06-30 DIAGNOSIS — I4891 Unspecified atrial fibrillation: Secondary | ICD-10-CM | POA: Diagnosis not present

## 2017-06-30 DIAGNOSIS — M869 Osteomyelitis, unspecified: Secondary | ICD-10-CM | POA: Diagnosis not present

## 2017-06-30 DIAGNOSIS — E11621 Type 2 diabetes mellitus with foot ulcer: Secondary | ICD-10-CM | POA: Diagnosis not present

## 2017-06-30 DIAGNOSIS — E1169 Type 2 diabetes mellitus with other specified complication: Secondary | ICD-10-CM | POA: Diagnosis not present

## 2017-06-30 LAB — GLUCOSE, CAPILLARY
Glucose-Capillary: 161 mg/dL — ABNORMAL HIGH (ref 65–99)
Glucose-Capillary: 234 mg/dL — ABNORMAL HIGH (ref 65–99)
Glucose-Capillary: 206 mg/dL — ABNORMAL HIGH (ref 65–99)

## 2017-06-30 MED ORDER — OXYCODONE HCL 5 MG PO TABS: 5.0000 mg | ORAL_TABLET | Freq: Four times a day (QID) | ORAL | 0 refills | Status: DC | PRN

## 2017-06-30 NOTE — Care Management Note (Addendum)
Case Management Note  Patient Details  Name: [name] MRN: [id number] Date of Birth: [birth year]  Subjective/Objective:    Amputation of Right First Toe               Action/Plan: NCM spoke to pt, wife and Haynes Bast # [phone] at bedside. Pt has RW at home. Offered choice for HH/list provided. Dtr agreeable to [facility]. Waiting PT recommendations for DME. Pt may need four prong cane for home. Will update [facility] to call dtr to arrange appts.   Pt does not want cane.   Expected Discharge Date:                Expected Discharge Plan:  [place]  In-House Referral:  NA  Discharge planning Services  CM Consult  Post Acute Care Choice:  Home Health Choice offered to:  Adult Children  DME Arranged:  Kasandra Knudsen DME Agency:  [place] Arranged:  RN, PT [facility] Agency:  [facility]  Status of Service:  Completed, signed off  If discussed at [employer] of Stay Meetings, dates discussed:    Additional Comments:  Erenest Rasher, RN 06/30/2017, 9:16 AM

## 2017-06-30 NOTE — Progress Notes (Signed)
Pt discharged home with wife and daughter. IV and telemetry box removed. Pt and pt's family received discharge instructions and all questions were answered. Pt discharged with all of his belongings. Pt discharged via wheelchair and was accompanied by this RN.    Grant Fontana BSN, RN

## 2017-06-30 NOTE — Progress Notes (Addendum)
[facility] [place]  Subjective  - Doing well over all, has abdominal pain with constipation.   Objective 125/74 87 98.1 F (36.7 C) (Oral) 15 100%  Intake/Output Summary (Last 24 hours) at 06/30/2017 0744 Last data filed at 06/30/2017 0122 Gross per 24 hour  Intake 1180.24 ml  Output 745 ml  Net 435.24 ml    Left foot dressing with minimal bleeding on dressing, no active bleeding. Heart A fib Lungs non labored breathing   Assessment/Planning: POD # 1  Amputation right first toe with resection of metatarsal head   Will order post op shoe.  For heel weight bearing.   Possible to discharge home later today.  Roxy Horseman 06/30/2017 7:44 AM --  Laboratory Lab Results: Recent Labs    06/29/17 0654  WBC 7.4  HGB 12.3*  HCT 35.6*  PLT 156   BMET Recent Labs    06/29/17 0654  NA 138  K 4.0  CL 103  CO2 24  GLUCOSE 321*  BUN 16  CREATININE 0.78  CALCIUM 9.5    COAG Lab Results  Component Value Date   INR 1.01 06/29/2017   INR 1.50 06/25/2017   INR 2.7 06/21/2017   No results found for: PTT   I have independently interviewed and examined patient and agree with PA assessment and plan above. Amputation site is healing well. Will get flat bottom shoe and set up 1 month f/u with Dr. Oneida Alar. Ok to resume anticoagulation for afib.  Chesley Valls C. Donzetta Matters, MD [facility] [place] Office: [phone] Pager: [phone]

## 2017-06-30 NOTE — Evaluation (Signed)
Physical Therapy Evaluation Patient Details Name: [name] MRN: [id number] DOB: Apr 02, 1924 Today's Date: 06/30/2017   History of Present Illness  Pt s/p lt great toe amputation. PMH - afib, dm, htn, gastric tumor,   Clinical Impression  Pt doing well with mobility. Recommend home with family and HHPT.       Follow Up Recommendations Home health PT;Supervision - Intermittent    Equipment Recommendations  None recommended by PT    Recommendations for Other Services       Precautions / Restrictions Precautions Precautions: Fall Restrictions Weight Bearing Restrictions: Yes LLE Weight Bearing: Weight bearing as tolerated Other Position/Activity Restrictions: Lt heel weight bearing in flat bottom post op shoe.       Mobility  Bed Mobility Overal bed mobility: Modified Independent             General bed mobility comments: Sit to supine observed  Transfers Overall transfer level: Needs assistance Equipment used: Rolling walker (2 wheeled) Transfers: Sit to/from Stand Sit to Stand: Supervision         General transfer comment: Verbal cues for hand placement  Ambulation/Gait Ambulation/Gait assistance: Supervision Ambulation Distance (Feet): 250 Feet Assistive device: Rolling walker (2 wheeled) Gait Pattern/deviations: Step-through pattern;Decreased stride length;Trunk flexed Gait velocity: decr   General Gait Details: Verbal cues to stand more erect and stay closer to walker for improved posture  Stairs Stairs: Yes Stairs assistance: Min guard Stair Management: One rail Right;Step to pattern;Sideways;Forwards Number of Stairs: 3    Wheelchair Mobility    Modified Rankin (Stroke Patients Only)       Balance Overall balance assessment: Needs assistance Sitting-balance support: No upper extremity supported;Feet supported Sitting balance-Leahy Scale: Good     Standing balance support: No upper extremity supported Standing balance-Leahy  Scale: Fair                               Pertinent Vitals/Pain Pain Assessment: No/denies pain    Home Living Family/patient expects to be discharged to:: Private residence Living Arrangements: Spouse/significant other Available Help at Discharge: Family Type of Home: House Home Access: Stairs to enter Entrance Stairs-Rails: Right Entrance Stairs-Number of Steps: 3 in front and 4 steps in back, 1 siderail on right side going up in front Home Layout: Able to live on main level with bedroom/bathroom Home Equipment: Walker - 2 wheels;Bedside commode;Shower seat;Cane - single point      Prior Function Level of Independence: Independent               Hand Dominance        Extremity/Trunk Assessment   Upper Extremity Assessment Upper Extremity Assessment: Overall WFL for tasks assessed    Lower Extremity Assessment Lower Extremity Assessment: LLE deficits/detail LLE Deficits / Details: pt with great toe amputation       Communication   Communication: HOH  Cognition Arousal/Alertness: Awake/alert Behavior During Therapy: WFL for tasks assessed/performed Overall Cognitive Status: Within Functional Limits for tasks assessed                                        General Comments      Exercises     Assessment/Plan    PT Assessment All further PT needs can be met in the next venue of care  PT Problem List Decreased strength;Decreased balance;Decreased mobility;Decreased knowledge of  use of DME       PT Treatment Interventions      PT Goals (Current goals can be found in the Care Plan section)  Acute Rehab PT Goals PT Goal Formulation: All assessment and education complete, DC therapy    Frequency     Barriers to discharge        Co-evaluation               AM-PAC PT "6 Clicks" Daily Activity  Outcome Measure Difficulty turning over in bed (including adjusting bedclothes, sheets and blankets)?: None Difficulty  moving from lying on back to sitting on the side of the bed? : A Little Difficulty sitting down on and standing up from a chair with arms (e.g., wheelchair, bedside commode, etc,.)?: A Little Help needed moving to and from a bed to chair (including a wheelchair)?: None Help needed walking in hospital room?: None Help needed climbing 3-5 steps with a railing? : A Little 6 Click Score: 21    End of Session Equipment Utilized During Treatment: Gait belt Activity Tolerance: Patient tolerated treatment well Patient left: in bed;with call bell/phone within reach;with bed alarm set;with family/visitor present Nurse Communication: Mobility status PT Visit Diagnosis: Other abnormalities of gait and mobility (R26.89)    Time: 1856-3149 PT Time Calculation (min) (ACUTE ONLY): 11 min   Charges:   PT Evaluation $PT Eval Low Complexity: 1 Low     PT G CodesMarland Kitchen        [facility] PT [place] 06/30/2017, 1:52 PM

## 2017-06-30 NOTE — Plan of Care (Signed)
  Progressing Clinical Measurements: Will remain free from infection 06/30/2017 0123 - Progressing by Peggye Pitt, RN Pain Managment: General experience of comfort will improve 06/30/2017 0123 - Progressing by Peggye Pitt, RN Skin Integrity: Risk for impaired skin integrity will decrease 06/30/2017 0123 - Progressing by Peggye Pitt, RN   Completed/Met Education: Knowledge of General Education information will improve 06/30/2017 0123 - Completed/Met by Peggye Pitt, RN Nutrition: Adequate nutrition will be maintained 06/30/2017 0123 - Completed/Met by Peggye Pitt, RN

## 2017-06-30 NOTE — Care Management Obs Status (Signed)
[facility] NOTIFICATION   Patient Details  Name: [name] MRN: [id number] Date of Birth: [birth year]   Medicare Observation Status Notification Given:  Yes    Erenest Rasher, RN 06/30/2017, 9:09 AM

## 2017-07-01 ENCOUNTER — Telehealth: Payer: Self-pay | Admitting: Vascular Surgery

## 2017-07-01 ENCOUNTER — Telehealth: Payer: Self-pay | Admitting: *Deleted

## 2017-07-01 ENCOUNTER — Other Ambulatory Visit (HOSPITAL_COMMUNITY): Payer: Self-pay | Admitting: Internal Medicine

## 2017-07-01 DIAGNOSIS — I482 Chronic atrial fibrillation: Secondary | ICD-10-CM | POA: Diagnosis not present

## 2017-07-01 DIAGNOSIS — Z89411 Acquired absence of right great toe: Secondary | ICD-10-CM | POA: Diagnosis not present

## 2017-07-01 DIAGNOSIS — Z4781 Encounter for orthopedic aftercare following surgical amputation: Secondary | ICD-10-CM | POA: Diagnosis not present

## 2017-07-01 DIAGNOSIS — E119 Type 2 diabetes mellitus without complications: Secondary | ICD-10-CM | POA: Diagnosis not present

## 2017-07-01 DIAGNOSIS — C61 Malignant neoplasm of prostate: Secondary | ICD-10-CM

## 2017-07-01 DIAGNOSIS — D519 Vitamin B12 deficiency anemia, unspecified: Secondary | ICD-10-CM | POA: Diagnosis not present

## 2017-07-01 DIAGNOSIS — F419 Anxiety disorder, unspecified: Secondary | ICD-10-CM | POA: Diagnosis not present

## 2017-07-01 NOTE — Telephone Encounter (Signed)
-----   Message from Mena Goes, RN sent at 06/30/2017  4:31 PM EDT ----- Regarding: 2 weeks wound check   ----- Message ----- From: [name], PA-C Sent: 06/30/2017   1:02 PM To: Vvs Charge Pool  S/P right great amputation f/u with Dr. Oneida Alar in 2 weeks for wound check

## 2017-07-01 NOTE — Telephone Encounter (Signed)
Please give Cheri, nurse w/ [facility] a call @ [phone]

## 2017-07-01 NOTE — Telephone Encounter (Signed)
LMOM for Cheri that it is OK for pt to resume coumadin per hospital note yesterday.  Take 5mg  daily except 2.5mg  on Tuesdays and Fridays.  Recheck INR on Monday 07/05/17.

## 2017-07-01 NOTE — Telephone Encounter (Signed)
Fields not in office in 2 wks. Appt is wound check with PA . Did not want to push out appt. Spoke to pt daughter to confirm appt on 07/12/17.

## 2017-07-02 ENCOUNTER — Ambulatory Visit (HOSPITAL_COMMUNITY): Admission: RE | Admit: 2017-07-02 | Payer: Medicare Other | Source: Ambulatory Visit

## 2017-07-02 ENCOUNTER — Telehealth (HOSPITAL_COMMUNITY): Payer: Self-pay

## 2017-07-02 NOTE — Telephone Encounter (Signed)
Patients daughter called and stated patient had recently had his great toe amputated and he is not supposed to be ambulating. She wanted to move his bone scan and follow up appt and Delton See out a month. Reviewed with Dr. Raliegh Ip who said this was okay. Message sent to scheduling. Explained to daughter everything would be moved out a month and scheduling will call with new appts. She verbalized understanding.

## 2017-07-03 DIAGNOSIS — F419 Anxiety disorder, unspecified: Secondary | ICD-10-CM | POA: Diagnosis not present

## 2017-07-03 DIAGNOSIS — E119 Type 2 diabetes mellitus without complications: Secondary | ICD-10-CM | POA: Diagnosis not present

## 2017-07-03 DIAGNOSIS — D519 Vitamin B12 deficiency anemia, unspecified: Secondary | ICD-10-CM | POA: Diagnosis not present

## 2017-07-03 DIAGNOSIS — Z4781 Encounter for orthopedic aftercare following surgical amputation: Secondary | ICD-10-CM | POA: Diagnosis not present

## 2017-07-03 DIAGNOSIS — I482 Chronic atrial fibrillation: Secondary | ICD-10-CM | POA: Diagnosis not present

## 2017-07-03 DIAGNOSIS — Z89411 Acquired absence of right great toe: Secondary | ICD-10-CM | POA: Diagnosis not present

## 2017-07-05 ENCOUNTER — Encounter: Payer: Medicare Other | Admitting: Surgery

## 2017-07-05 ENCOUNTER — Ambulatory Visit (INDEPENDENT_AMBULATORY_CARE_PROVIDER_SITE_OTHER): Payer: Medicare Other | Admitting: *Deleted

## 2017-07-05 DIAGNOSIS — D519 Vitamin B12 deficiency anemia, unspecified: Secondary | ICD-10-CM | POA: Diagnosis not present

## 2017-07-05 DIAGNOSIS — Z4781 Encounter for orthopedic aftercare following surgical amputation: Secondary | ICD-10-CM | POA: Diagnosis not present

## 2017-07-05 DIAGNOSIS — I4891 Unspecified atrial fibrillation: Secondary | ICD-10-CM

## 2017-07-05 DIAGNOSIS — F419 Anxiety disorder, unspecified: Secondary | ICD-10-CM | POA: Diagnosis not present

## 2017-07-05 DIAGNOSIS — Z89411 Acquired absence of right great toe: Secondary | ICD-10-CM | POA: Diagnosis not present

## 2017-07-05 DIAGNOSIS — I482 Chronic atrial fibrillation: Secondary | ICD-10-CM | POA: Diagnosis not present

## 2017-07-05 DIAGNOSIS — E119 Type 2 diabetes mellitus without complications: Secondary | ICD-10-CM | POA: Diagnosis not present

## 2017-07-05 DIAGNOSIS — Z5181 Encounter for therapeutic drug level monitoring: Secondary | ICD-10-CM | POA: Diagnosis not present

## 2017-07-05 LAB — POCT INR: INR: 1.3

## 2017-07-05 NOTE — Patient Instructions (Signed)
Restarted coumadin 3/13 after foot surgery Take coumadin 3 tablets tonight, 2 tablets tomorrow night then resume 2 tablets daily except 1 tablet on Tuesdays and Fridays.   Recheck in 1 week Order given to [employer]

## 2017-07-07 ENCOUNTER — Other Ambulatory Visit (HOSPITAL_COMMUNITY): Payer: Medicare Other

## 2017-07-07 DIAGNOSIS — D519 Vitamin B12 deficiency anemia, unspecified: Secondary | ICD-10-CM | POA: Diagnosis not present

## 2017-07-07 DIAGNOSIS — Z89411 Acquired absence of right great toe: Secondary | ICD-10-CM | POA: Diagnosis not present

## 2017-07-07 DIAGNOSIS — E119 Type 2 diabetes mellitus without complications: Secondary | ICD-10-CM | POA: Diagnosis not present

## 2017-07-07 DIAGNOSIS — I482 Chronic atrial fibrillation: Secondary | ICD-10-CM | POA: Diagnosis not present

## 2017-07-07 DIAGNOSIS — F419 Anxiety disorder, unspecified: Secondary | ICD-10-CM | POA: Diagnosis not present

## 2017-07-07 DIAGNOSIS — Z4781 Encounter for orthopedic aftercare following surgical amputation: Secondary | ICD-10-CM | POA: Diagnosis not present

## 2017-07-08 NOTE — Discharge Summary (Signed)
Physician Discharge Summary   Patient ID: [name] [id number] 84 y.o. 09-02-1923  Admit date: 06/29/2017  Discharge date and time: 06/30/2017  4:21 PM   Admitting Physician: [name], MD   Discharge Physician: same  Admission Diagnoses: ischemic toe  Discharge Diagnoses: s/p R GT amputation  Admission Condition: fair  Discharged Condition: fair  Indication for Admission: osteomyelitis right first toe  Hospital Course: [name] is a 84y.o male who came in as an outpatient for right first toe amputation by Dr. Oneida Alar on 06/29/17 due to R GT osteomyelitis.  Arteriogram RLE performed one week prior demonstrated patent three vessel runoff with calcification bilaterally and Dr. Oneida Alar believed this wound be adequate to heal an amputation.  Patient tolerated the procedure well and was admitted to the hospital post operatively.  POD#1 patient was feeling fit for discharge home with minimal pain.  Home health services were arranged by Care management.  He may resume his coumadin for atrial fibrillation.  He will follow up with Dr. Oneida Alar in office in 2 weeks for wound check.  Discharge instructions were reviewed with the patient and he voiced his understanding.  He was discharged in stable condition.  Consults: None  Treatments: surgery: Right first toe amputation with resection of metatarsal head  Discharge Exam: see progress note 06/30/17 Vitals:   06/30/17 0854 06/30/17 1229  BP: 100/75 111/81  Pulse: 87   Resp:  18  Temp:    SpO2:      Disposition: home  Patient Instructions:  Allergies as of 06/30/2017   No Known Allergies     Medication List    TAKE these medications   abiraterone acetate 250 MG tablet Commonly known as:  ZYTIGA Take 4 tablets (1,000 mg total) by mouth daily. Take on an empty stomach 1 hour before or 2 hours after a meal   acetaminophen 500 MG tablet Commonly known as:  TYLENOL Take 500-1,000 mg by mouth daily as needed for mild pain,  fever or headache.   amitriptyline 25 MG tablet Commonly known as:  ELAVIL Take 25 mg by mouth at bedtime.   amoxicillin-clavulanate 875-125 MG tablet Commonly known as:  AUGMENTIN Take 1 tablet by mouth every 12 (twelve) hours. Notes to patient:  DO NOT TAKE   CALCIUM CARB-CHOLECALCIFEROL PO Take 1 tablet by mouth daily. Notes to patient:  You did not receive this medication during your hospital stay. You may resume it after discharge.   clotrimazole-betamethasone cream Commonly known as:  LOTRISONE Apply 1 application topically daily as needed (for fungus).   CRANBERRY PO Take 4,200 mg by mouth daily. Notes to patient:  You did not receive this medication during your hospital stay. You may resume it after discharge.   cyanocobalamin 1000 MCG/ML injection Commonly known as:  (VITAMIN B-12) Inject 1,000 mcg every 30 (thirty) days into the skin. Notes to patient:  You did not receive this medication during your hospital stay. You may resume it after discharge.   denosumab 120 MG/1.7ML Soln injection Commonly known as:  XGEVA Inject 120 mg into the skin every 30 (thirty) days. Notes to patient:  You did not receive this medication during your hospital stay. You may resume it after discharge.   diltiazem 90 MG tablet Commonly known as:  CARDIZEM TAKE ONE TABLET BY MOUTH TWICE DAILY What changed:    how much to take  how to take this  when to take this   diphenhydrAMINE 25 MG tablet Commonly known as:  BENADRYL Take 25 mg by mouth every 6 (six) hours as needed for itching.   docusate sodium 100 MG capsule Commonly known as:  COLACE Take 100 mg by mouth daily as needed for mild constipation.   fludrocortisone 0.1 MG tablet Commonly known as:  FLORINEF Take 1 tablet (0.1 mg total) daily by mouth.   furosemide 20 MG tablet Commonly known as:  LASIX Take 1 tablet (20 mg total) by mouth daily.   glipiZIDE 5 MG 24 hr tablet Commonly known as:  GLUCOTROL XL Take 5  mg by mouth daily with breakfast.   guaiFENesin 600 MG 12 hr tablet Commonly known as:  MUCINEX Take 1 tablet (600 mg total) by mouth 2 (two) times daily. What changed:    when to take this  reasons to take this   lisinopril 20 MG tablet Commonly known as:  PRINIVIL,ZESTRIL Take 1 tablet (20 mg total) daily by mouth.   loperamide 2 MG capsule Commonly known as:  IMODIUM Take 2 mg by mouth as needed for diarrhea or loose stools.   LUPRON DEPOT (71-MONTH) 45 MG injection Generic drug:  Leuprolide Acetate (6 Month) Inject 45 mg into the muscle every 6 (six) months. Notes to patient:  You did not receive this medication during your hospital stay. You may resume it after discharge.   metFORMIN 850 MG tablet Commonly known as:  GLUCOPHAGE Take 850 mg by mouth 2 (two) times daily with a meal.   nystatin powder Commonly known as:  MYCOSTATIN/NYSTOP Apply 1 g topically 4 (four) times daily as needed (fungus).   omeprazole 20 MG capsule Commonly known as:  PRILOSEC Take 20 mg by mouth daily.   oxyCODONE 5 MG immediate release tablet Commonly known as:  Oxy IR/ROXICODONE Take 1 tablet (5 mg total) by mouth every 6 (six) hours as needed for moderate pain.   phenylephrine-shark liver oil-mineral oil-petrolatum 0.25-3-14-71.9 % rectal ointment Commonly known as:  PREPARATION H Place 1 application rectally 2 (two) times daily as needed (for chaffing).   potassium chloride 10 MEQ tablet Commonly known as:  K-DUR,KLOR-CON Take 2 tablets (20 mEq total) by mouth daily.   predniSONE 5 MG tablet Commonly known as:  DELTASONE Take 1 tablet (5 mg total) by mouth 2 (two) times daily with a meal.   sennosides-docusate sodium 8.6-50 MG tablet Commonly known as:  SENOKOT-S Take 1 tablet by mouth daily as needed for constipation.   VENTOLIN HFA 108 (90 Base) MCG/ACT inhaler Generic drug:  albuterol Inhale 1-2 puffs into the lungs every 6 (six) hours as needed for wheezing or shortness of  breath.   Vitamin D3 1000 units Caps Take 1,000 Units by mouth daily.   warfarin 2.5 MG tablet Commonly known as:  COUMADIN Take as directed. If you are unsure how to take this medication, talk to your nurse or doctor. Original instructions:  Take as directed by [facility].  RESUME ON Tuesday 06/29/17 AFTER YOUR TOE AMPUTATION      Activity: activity as tolerated Diet: regular diet Wound Care: as directed  Follow-up with Dr. Oneida Alar in office.  Office will call to arrange.  SignedDagoberto Ligas 07/08/2017 9:59 AM

## 2017-07-09 ENCOUNTER — Other Ambulatory Visit (HOSPITAL_COMMUNITY): Payer: Medicare Other

## 2017-07-09 ENCOUNTER — Ambulatory Visit (HOSPITAL_COMMUNITY): Payer: Medicare Other

## 2017-07-09 ENCOUNTER — Ambulatory Visit (HOSPITAL_COMMUNITY): Payer: Medicare Other | Admitting: Hematology

## 2017-07-09 DIAGNOSIS — F419 Anxiety disorder, unspecified: Secondary | ICD-10-CM | POA: Diagnosis not present

## 2017-07-09 DIAGNOSIS — I482 Chronic atrial fibrillation: Secondary | ICD-10-CM | POA: Diagnosis not present

## 2017-07-09 DIAGNOSIS — Z4781 Encounter for orthopedic aftercare following surgical amputation: Secondary | ICD-10-CM | POA: Diagnosis not present

## 2017-07-09 DIAGNOSIS — E119 Type 2 diabetes mellitus without complications: Secondary | ICD-10-CM | POA: Diagnosis not present

## 2017-07-09 DIAGNOSIS — Z89411 Acquired absence of right great toe: Secondary | ICD-10-CM | POA: Diagnosis not present

## 2017-07-09 DIAGNOSIS — D519 Vitamin B12 deficiency anemia, unspecified: Secondary | ICD-10-CM | POA: Diagnosis not present

## 2017-07-12 ENCOUNTER — Ambulatory Visit (INDEPENDENT_AMBULATORY_CARE_PROVIDER_SITE_OTHER): Payer: Medicare Other | Admitting: Surgery

## 2017-07-12 ENCOUNTER — Other Ambulatory Visit: Payer: Self-pay

## 2017-07-12 ENCOUNTER — Encounter: Payer: Self-pay | Admitting: Surgery

## 2017-07-12 VITALS — BP 141/78 | HR 83 | Temp 97.6°F | Resp 20 | Ht 70.0 in | Wt 162.7 lb

## 2017-07-12 DIAGNOSIS — M869 Osteomyelitis, unspecified: Secondary | ICD-10-CM

## 2017-07-12 NOTE — Progress Notes (Signed)
POST OPERATIVE OFFICE NOTE    CC:  F/u for surgery  HPI:  This is a 84 y.o. male who is s/p amputation of left first toe with resection of metatarsal head for osteomyelitis on 06/29/17 by Dr. Oneida Alar.  He also had an aortogram on 06/25/17 by Dr. Oneida Alar that revealed patent three vessel runoff with calcification bilaterally.    He returns today for a wound check.  His wife of 3 years states they have been doing well.  She has been changing his dressing daily.  [facility] is coming out to check on his wound but physical therapy has not been out to their house.    He states he has a little more swelling in the left foot today as he has been out to the [employer] for lunch and has been on his feet since noon.  Otherwise, he has been elevating his leg at home when walking.   Allergies  Allergen Reactions  . Augmentin [Amoxicillin-Pot Clavulanate] Rash    Redness of skin    Current Outpatient Medications  Medication Sig Dispense Refill  . abiraterone acetate (ZYTIGA) 250 MG tablet Take 4 tablets (1,000 mg total) by mouth daily. Take on an empty stomach 1 hour before or 2 hours after a meal 120 tablet 6  . acetaminophen (TYLENOL) 500 MG tablet Take 500-1,000 mg by mouth daily as needed for mild pain, fever or headache.    Marland Kitchen amitriptyline (ELAVIL) 25 MG tablet Take 25 mg by mouth at bedtime.    Marland Kitchen CALCIUM CARB-CHOLECALCIFEROL PO Take 1 tablet by mouth daily.     . Cholecalciferol (VITAMIN D3) 1000 units CAPS Take 1,000 Units by mouth daily.    . clotrimazole-betamethasone (LOTRISONE) cream Apply 1 application topically daily as needed (for fungus).     . CRANBERRY PO Take 4,200 mg by mouth daily.    . cyanocobalamin (,VITAMIN B-12,) 1000 MCG/ML injection Inject 1,000 mcg every 30 (thirty) days into the skin.    Marland Kitchen denosumab (XGEVA) 120 MG/1.7ML SOLN injection Inject 120 mg into the skin every 30 (thirty) days.     Marland Kitchen diltiazem (CARDIZEM) 90 MG tablet TAKE ONE TABLET BY MOUTH TWICE DAILY (Patient taking  differently: TAKE 90 MG BY MOUTH TWICE DAILY) 180 tablet 2  . diphenhydrAMINE (BENADRYL) 25 MG tablet Take 25 mg by mouth every 6 (six) hours as needed for itching.    . docusate sodium (COLACE) 100 MG capsule Take 100 mg by mouth daily as needed for mild constipation.    . fludrocortisone (FLORINEF) 0.1 MG tablet Take 1 tablet (0.1 mg total) daily by mouth. 30 tablet 11  . furosemide (LASIX) 20 MG tablet Take 1 tablet (20 mg total) by mouth daily. 30 tablet 11  . glipiZIDE (GLUCOTROL XL) 5 MG 24 hr tablet Take 5 mg by mouth daily with breakfast.     . guaiFENesin (MUCINEX) 600 MG 12 hr tablet Take 1 tablet (600 mg total) by mouth 2 (two) times daily. (Patient taking differently: Take 600 mg by mouth 2 (two) times daily as needed for cough or to loosen phlegm. ) 30 tablet 0  . Leuprolide Acetate, 6 Month, (LUPRON DEPOT) 45 MG injection Inject 45 mg into the muscle every 6 (six) months.    Marland Kitchen lisinopril (PRINIVIL,ZESTRIL) 20 MG tablet Take 1 tablet (20 mg total) daily by mouth. 30 tablet 11  . loperamide (IMODIUM) 2 MG capsule Take 2 mg by mouth as needed for diarrhea or loose stools.    Marland Kitchen  metFORMIN (GLUCOPHAGE) 850 MG tablet Take 850 mg by mouth 2 (two) times daily with a meal.     . nystatin (MYCOSTATIN) powder Apply 1 g topically 4 (four) times daily as needed (fungus).     Marland Kitchen omeprazole (PRILOSEC) 20 MG capsule Take 20 mg by mouth daily.     Marland Kitchen oxyCODONE (OXY IR/ROXICODONE) 5 MG immediate release tablet Take 1 tablet (5 mg total) by mouth every 6 (six) hours as needed for moderate pain. 6 tablet 0  . phenylephrine-shark liver oil-mineral oil-petrolatum (PREPARATION H) 0.25-3-14-71.9 % rectal ointment Place 1 application rectally 2 (two) times daily as needed (for chaffing).    . potassium chloride (K-DUR,KLOR-CON) 10 MEQ tablet Take 2 tablets (20 mEq total) by mouth daily. 60 tablet 0  . predniSONE (DELTASONE) 5 MG tablet Take 1 tablet (5 mg total) by mouth 2 (two) times daily with a meal. 30 tablet  2  . sennosides-docusate sodium (SENOKOT-S) 8.6-50 MG tablet Take 1 tablet by mouth daily as needed for constipation.     . VENTOLIN HFA 108 (90 Base) MCG/ACT inhaler Inhale 1-2 puffs into the lungs every 6 (six) hours as needed for wheezing or shortness of breath.     . warfarin (COUMADIN) 2.5 MG tablet Take as directed by [facility].  RESUME ON Tuesday 06/29/17 AFTER YOUR TOE AMPUTATION 160 tablet 0   No current facility-administered medications for this visit.      ROS:  See HPI  Physical Exam:  Vitals:   07/12/17 1400  BP: (!) 141/78  Pulse: 83  Resp: 20  Temp: 97.6 F (36.4 C)  SpO2: 98%    Incision:  Healing nicely; nylon sutures in place Extremities:  Left foot is warm    Assessment/Plan:  This is a 84 y.o. male who is s/p: Amputation of left great toe on 06/29/17 and aortogram on 06/25/17 with patent 3 vessel runoff with calcification.   -pt doing quite well since amputation.  He states he needed some tylenol the first few days but hasn't required anything since. -his incision is healing nicely.  His sutures have only been in for a couple of weeks.  Will have him return in 2 more weeks to see Dr. Oneida Alar and have his sutures removed at that time. -advised him to continue ambulating but elevate his leg when he isn't walking.  He is okay to shower with soap and water.  Continue dry dressing daily and post op shoe.   -family inquired about home physical therapy-will try to get evaluation set up for pt .   Leontine Locket, PA-C [facility] [phone]  Clinic MD:  Trula Slade

## 2017-07-13 ENCOUNTER — Ambulatory Visit (INDEPENDENT_AMBULATORY_CARE_PROVIDER_SITE_OTHER): Payer: Medicare Other | Admitting: *Deleted

## 2017-07-13 DIAGNOSIS — E119 Type 2 diabetes mellitus without complications: Secondary | ICD-10-CM | POA: Diagnosis not present

## 2017-07-13 DIAGNOSIS — Z4781 Encounter for orthopedic aftercare following surgical amputation: Secondary | ICD-10-CM | POA: Diagnosis not present

## 2017-07-13 DIAGNOSIS — F419 Anxiety disorder, unspecified: Secondary | ICD-10-CM | POA: Diagnosis not present

## 2017-07-13 DIAGNOSIS — I4891 Unspecified atrial fibrillation: Secondary | ICD-10-CM | POA: Diagnosis not present

## 2017-07-13 DIAGNOSIS — Z5181 Encounter for therapeutic drug level monitoring: Secondary | ICD-10-CM | POA: Diagnosis not present

## 2017-07-13 DIAGNOSIS — I482 Chronic atrial fibrillation: Secondary | ICD-10-CM | POA: Diagnosis not present

## 2017-07-13 DIAGNOSIS — Z89411 Acquired absence of right great toe: Secondary | ICD-10-CM | POA: Diagnosis not present

## 2017-07-13 DIAGNOSIS — D519 Vitamin B12 deficiency anemia, unspecified: Secondary | ICD-10-CM | POA: Diagnosis not present

## 2017-07-13 LAB — POCT INR: INR: 2.7

## 2017-07-13 NOTE — Patient Instructions (Signed)
Continue coumadin 2 tablets daily except 1 tablet on Tuesdays and Fridays.   Recheck in 1 week Order given to [place]

## 2017-07-16 DIAGNOSIS — Z4781 Encounter for orthopedic aftercare following surgical amputation: Secondary | ICD-10-CM | POA: Diagnosis not present

## 2017-07-16 DIAGNOSIS — F419 Anxiety disorder, unspecified: Secondary | ICD-10-CM | POA: Diagnosis not present

## 2017-07-16 DIAGNOSIS — Z89411 Acquired absence of right great toe: Secondary | ICD-10-CM | POA: Diagnosis not present

## 2017-07-16 DIAGNOSIS — I482 Chronic atrial fibrillation: Secondary | ICD-10-CM | POA: Diagnosis not present

## 2017-07-16 DIAGNOSIS — E119 Type 2 diabetes mellitus without complications: Secondary | ICD-10-CM | POA: Diagnosis not present

## 2017-07-16 DIAGNOSIS — D519 Vitamin B12 deficiency anemia, unspecified: Secondary | ICD-10-CM | POA: Diagnosis not present

## 2017-07-19 DIAGNOSIS — F419 Anxiety disorder, unspecified: Secondary | ICD-10-CM | POA: Diagnosis not present

## 2017-07-19 DIAGNOSIS — D519 Vitamin B12 deficiency anemia, unspecified: Secondary | ICD-10-CM | POA: Diagnosis not present

## 2017-07-19 DIAGNOSIS — Z89411 Acquired absence of right great toe: Secondary | ICD-10-CM | POA: Diagnosis not present

## 2017-07-19 DIAGNOSIS — E119 Type 2 diabetes mellitus without complications: Secondary | ICD-10-CM | POA: Diagnosis not present

## 2017-07-19 DIAGNOSIS — Z4781 Encounter for orthopedic aftercare following surgical amputation: Secondary | ICD-10-CM | POA: Diagnosis not present

## 2017-07-19 DIAGNOSIS — I482 Chronic atrial fibrillation: Secondary | ICD-10-CM | POA: Diagnosis not present

## 2017-07-20 ENCOUNTER — Telehealth: Payer: Self-pay | Admitting: *Deleted

## 2017-07-20 ENCOUNTER — Ambulatory Visit (INDEPENDENT_AMBULATORY_CARE_PROVIDER_SITE_OTHER): Payer: Medicare Other | Admitting: *Deleted

## 2017-07-20 DIAGNOSIS — I482 Chronic atrial fibrillation: Secondary | ICD-10-CM | POA: Diagnosis not present

## 2017-07-20 DIAGNOSIS — I4891 Unspecified atrial fibrillation: Secondary | ICD-10-CM

## 2017-07-20 DIAGNOSIS — E119 Type 2 diabetes mellitus without complications: Secondary | ICD-10-CM | POA: Diagnosis not present

## 2017-07-20 DIAGNOSIS — Z4781 Encounter for orthopedic aftercare following surgical amputation: Secondary | ICD-10-CM | POA: Diagnosis not present

## 2017-07-20 DIAGNOSIS — F419 Anxiety disorder, unspecified: Secondary | ICD-10-CM | POA: Diagnosis not present

## 2017-07-20 DIAGNOSIS — Z5181 Encounter for therapeutic drug level monitoring: Secondary | ICD-10-CM | POA: Diagnosis not present

## 2017-07-20 DIAGNOSIS — Z89411 Acquired absence of right great toe: Secondary | ICD-10-CM | POA: Diagnosis not present

## 2017-07-20 DIAGNOSIS — D519 Vitamin B12 deficiency anemia, unspecified: Secondary | ICD-10-CM | POA: Diagnosis not present

## 2017-07-20 LAB — POCT INR: INR: 1.4

## 2017-07-20 NOTE — Patient Instructions (Addendum)
Take coumadin 3 tablets tonight and tomorrow night then resume 2 tablets daily except 1 tablet on Tuesdays and Fridays.   Recheck in 1 week Order given to [employer]

## 2017-07-20 NOTE — Telephone Encounter (Signed)
Done.  See coumadin note. 

## 2017-07-20 NOTE — Telephone Encounter (Signed)
[name] with [facility] - please call [phone] INR 1.4 With no changes

## 2017-07-26 ENCOUNTER — Ambulatory Visit (INDEPENDENT_AMBULATORY_CARE_PROVIDER_SITE_OTHER): Payer: Medicare Other | Admitting: *Deleted

## 2017-07-26 DIAGNOSIS — I4891 Unspecified atrial fibrillation: Secondary | ICD-10-CM | POA: Diagnosis not present

## 2017-07-26 DIAGNOSIS — Z5181 Encounter for therapeutic drug level monitoring: Secondary | ICD-10-CM

## 2017-07-26 DIAGNOSIS — E119 Type 2 diabetes mellitus without complications: Secondary | ICD-10-CM | POA: Diagnosis not present

## 2017-07-26 DIAGNOSIS — I482 Chronic atrial fibrillation: Secondary | ICD-10-CM | POA: Diagnosis not present

## 2017-07-26 DIAGNOSIS — Z89411 Acquired absence of right great toe: Secondary | ICD-10-CM | POA: Diagnosis not present

## 2017-07-26 DIAGNOSIS — D519 Vitamin B12 deficiency anemia, unspecified: Secondary | ICD-10-CM | POA: Diagnosis not present

## 2017-07-26 DIAGNOSIS — Z4781 Encounter for orthopedic aftercare following surgical amputation: Secondary | ICD-10-CM | POA: Diagnosis not present

## 2017-07-26 DIAGNOSIS — F419 Anxiety disorder, unspecified: Secondary | ICD-10-CM | POA: Diagnosis not present

## 2017-07-26 LAB — POCT INR: INR: 2

## 2017-07-26 NOTE — Patient Instructions (Signed)
Continue coumadin 2 tablets daily except 1 tablet on Tuesdays and Fridays.   Recheck in 1 week Order given to [employer]

## 2017-07-27 ENCOUNTER — Other Ambulatory Visit (HOSPITAL_COMMUNITY): Payer: Medicare Other

## 2017-07-27 ENCOUNTER — Ambulatory Visit (HOSPITAL_COMMUNITY): Payer: Medicare Other | Admitting: Hematology

## 2017-07-27 ENCOUNTER — Ambulatory Visit (HOSPITAL_COMMUNITY): Payer: Medicare Other

## 2017-07-29 ENCOUNTER — Other Ambulatory Visit: Payer: Self-pay

## 2017-07-29 ENCOUNTER — Encounter: Payer: Medicare Other | Admitting: Vascular Surgery

## 2017-07-29 DIAGNOSIS — D510 Vitamin B12 deficiency anemia due to intrinsic factor deficiency: Secondary | ICD-10-CM | POA: Diagnosis not present

## 2017-07-29 MED ORDER — DILTIAZEM HCL 90 MG PO TABS: ORAL_TABLET | ORAL | 0 refills | Status: DC

## 2017-07-29 NOTE — Telephone Encounter (Signed)
Refilled cardizem to [facility] [place]

## 2017-07-30 ENCOUNTER — Ambulatory Visit (INDEPENDENT_AMBULATORY_CARE_PROVIDER_SITE_OTHER): Payer: Medicare Other | Admitting: Vascular Surgery

## 2017-07-30 ENCOUNTER — Encounter: Payer: Self-pay | Admitting: Vascular Surgery

## 2017-07-30 DIAGNOSIS — IMO0002 Reserved for concepts with insufficient information to code with codable children: Secondary | ICD-10-CM

## 2017-07-30 DIAGNOSIS — S98112A Complete traumatic amputation of left great toe, initial encounter: Secondary | ICD-10-CM

## 2017-07-30 NOTE — Progress Notes (Signed)
    Postoperative Visit    History of Present Illness   [name] is a 84 y.o. male who presents for postoperative follow-up for: left great toe amputation with resection of metatarsal head.(Date: 06/29/17).  He underwent arteriogram prior to amputation which demonstrated 3 vessel run off to the foot with heavy calcification.  The patient's wounds are well healed.  The patient notes pain is well controlled.  The patient and wife noticed a rash over the past few days on the inside of his L foot.  It is now slowly fading.  He denies any itching or blistering.  He also denies rest pain.  For VQI Use Only   PRE-ADM LIVING: Home  AMB STATUS: Ambulatory   Physical Examination   Vitals:   07/30/17 1258  BP: 128/80  Pulse: 74  Resp: 20  Temp: 97.6 F (36.4 C)  SpO2: 99%    LLE: Toe is well healed with sutures still in place.  Hive like rash, low intensity L medial foot; tip of L second toe with pinpoint eschar no obvious infection   Medical Decision Making   [name] is a 84 y.o. male who presents s/p left great toe amputation with resection of the metatarsal head due to acute osteomyelitis   Amputation site well healed  Sutures removed in office today  Discontinue walking boot; recommended open toe sandals  Monitor pinpoint ulceration L 2nd toe and return to office if this worsens  Follow up as needed  Dagoberto Ligas, PA-C [facility] [place] Office: [phone]

## 2017-07-31 DIAGNOSIS — E119 Type 2 diabetes mellitus without complications: Secondary | ICD-10-CM | POA: Diagnosis not present

## 2017-07-31 DIAGNOSIS — Z89411 Acquired absence of right great toe: Secondary | ICD-10-CM | POA: Diagnosis not present

## 2017-07-31 DIAGNOSIS — Z4781 Encounter for orthopedic aftercare following surgical amputation: Secondary | ICD-10-CM | POA: Diagnosis not present

## 2017-07-31 DIAGNOSIS — F419 Anxiety disorder, unspecified: Secondary | ICD-10-CM | POA: Diagnosis not present

## 2017-07-31 DIAGNOSIS — D519 Vitamin B12 deficiency anemia, unspecified: Secondary | ICD-10-CM | POA: Diagnosis not present

## 2017-07-31 DIAGNOSIS — I482 Chronic atrial fibrillation: Secondary | ICD-10-CM | POA: Diagnosis not present

## 2017-08-02 ENCOUNTER — Ambulatory Visit (HOSPITAL_COMMUNITY)
Admission: RE | Admit: 2017-08-02 | Discharge: 2017-08-02 | Disposition: A | Discharge status: Home / Self Care | Payer: Medicare Other | Source: Ambulatory Visit | Attending: Internal Medicine | Admitting: Internal Medicine

## 2017-08-02 ENCOUNTER — Encounter (HOSPITAL_COMMUNITY)
Admission: RE | Admit: 2017-08-02 | Discharge: 2017-08-02 | Disposition: A | Discharge status: Home / Self Care | Payer: Medicare Other | Source: Ambulatory Visit | Attending: Internal Medicine | Admitting: Internal Medicine

## 2017-08-02 ENCOUNTER — Encounter (HOSPITAL_COMMUNITY): Payer: Self-pay

## 2017-08-02 DIAGNOSIS — I700 Atherosclerosis of aorta: Secondary | ICD-10-CM | POA: Diagnosis not present

## 2017-08-02 DIAGNOSIS — M899 Disorder of bone, unspecified: Secondary | ICD-10-CM | POA: Diagnosis not present

## 2017-08-02 DIAGNOSIS — C61 Malignant neoplasm of prostate: Secondary | ICD-10-CM

## 2017-08-02 DIAGNOSIS — C7951 Secondary malignant neoplasm of bone: Secondary | ICD-10-CM | POA: Diagnosis not present

## 2017-08-02 DIAGNOSIS — I517 Cardiomegaly: Secondary | ICD-10-CM | POA: Insufficient documentation

## 2017-08-02 DIAGNOSIS — K8689 Other specified diseases of pancreas: Secondary | ICD-10-CM | POA: Insufficient documentation

## 2017-08-02 MED ORDER — TECHNETIUM TC 99M MEDRONATE IV KIT
20.0000 | PACK | Freq: Once | INTRAVENOUS | Status: AC | PRN
  Administered 2017-08-02: 20.6 via INTRAVENOUS

## 2017-08-02 MED ORDER — IOPAMIDOL (ISOVUE-300) INJECTION 61%
100.0000 mL | Freq: Once | INTRAVENOUS | Status: AC | PRN
  Administered 2017-08-02: 100 mL via INTRAVENOUS

## 2017-08-03 ENCOUNTER — Ambulatory Visit (INDEPENDENT_AMBULATORY_CARE_PROVIDER_SITE_OTHER): Payer: Medicare Other | Admitting: *Deleted

## 2017-08-03 DIAGNOSIS — I4891 Unspecified atrial fibrillation: Secondary | ICD-10-CM | POA: Diagnosis not present

## 2017-08-03 DIAGNOSIS — D519 Vitamin B12 deficiency anemia, unspecified: Secondary | ICD-10-CM | POA: Diagnosis not present

## 2017-08-03 DIAGNOSIS — Z89411 Acquired absence of right great toe: Secondary | ICD-10-CM | POA: Diagnosis not present

## 2017-08-03 DIAGNOSIS — I10 Essential (primary) hypertension: Secondary | ICD-10-CM

## 2017-08-03 DIAGNOSIS — F419 Anxiety disorder, unspecified: Secondary | ICD-10-CM | POA: Diagnosis not present

## 2017-08-03 DIAGNOSIS — E119 Type 2 diabetes mellitus without complications: Secondary | ICD-10-CM | POA: Diagnosis not present

## 2017-08-03 DIAGNOSIS — I482 Chronic atrial fibrillation: Secondary | ICD-10-CM | POA: Diagnosis not present

## 2017-08-03 DIAGNOSIS — Z4781 Encounter for orthopedic aftercare following surgical amputation: Secondary | ICD-10-CM | POA: Diagnosis not present

## 2017-08-03 LAB — POCT INR: INR: 2.4

## 2017-08-03 NOTE — Patient Instructions (Signed)
Continue coumadin 2 tablets daily except 1 tablet on Tuesdays and Fridays.   Recheck in 1 week Order given to [place]

## 2017-08-05 ENCOUNTER — Other Ambulatory Visit: Payer: Self-pay

## 2017-08-05 ENCOUNTER — Encounter: Payer: Self-pay | Admitting: Vascular Surgery

## 2017-08-05 ENCOUNTER — Ambulatory Visit (INDEPENDENT_AMBULATORY_CARE_PROVIDER_SITE_OTHER): Payer: Medicare Other | Admitting: Vascular Surgery

## 2017-08-05 VITALS — BP 131/80 | HR 80 | Temp 97.0°F | Resp 20 | Ht 70.0 in | Wt 162.0 lb

## 2017-08-05 DIAGNOSIS — I739 Peripheral vascular disease, unspecified: Secondary | ICD-10-CM

## 2017-08-05 MED ORDER — CEPHALEXIN 500 MG PO CAPS: 500.0000 mg | ORAL_CAPSULE | Freq: Three times a day (TID) | ORAL | 0 refills | Status: DC

## 2017-08-05 NOTE — H&P (View-Only) (Signed)
Patient is a 84 year old male who presented today with increasing redness in his right second toe.  He underwent successful right first toe amputation June 29, 2017.  There was a ulcer on the tip of this toe and now he is has developed erythema extending all the way back to the metatarsal.  There is no purulent drainage or fluctuance.  He has not had fever or chills.  Physical exam:  Vitals:   08/05/17 1523  BP: 131/80  Pulse: 80  Resp: 20  Temp: (!) 97 F (36.1 C)  TempSrc: Oral  SpO2: 98%  Weight: 162 lb (73.5 kg)  Height: 5\' 10"  (1.778 m)    Well-healed first amputation of toe, second toe with dark discoloration on the tip erythema extending over the entire surface of the toe circumferentially with some extension up to the metatarsal head no fluctuance  Assessment: Cellulitis right second toe potentially osteomyelitis  Plan: We will try a course of Keflex for 10 days.  If the toe worsens over the next few days we will schedule him for a an amputation of this toe as well.  Otherwise he will follow-up with me in 2 weeks.  The patient's family is also requesting an appointment with podiatry to trim his thickened toenails on both feet.  Ruta Hinds, MD [facility] [place] Office: [phone] Pager: [phone]

## 2017-08-05 NOTE — Progress Notes (Signed)
Patient is a 84 year old male who presented today with increasing redness in his right second toe.  He underwent successful right first toe amputation June 29, 2017.  There was a ulcer on the tip of this toe and now he is has developed erythema extending all the way back to the metatarsal.  There is no purulent drainage or fluctuance.  He has not had fever or chills.  Physical exam:  Vitals:   08/05/17 1523  BP: 131/80  Pulse: 80  Resp: 20  Temp: (!) 97 F (36.1 C)  TempSrc: Oral  SpO2: 98%  Weight: 162 lb (73.5 kg)  Height: 5\' 10"  (1.778 m)    Well-healed first amputation of toe, second toe with dark discoloration on the tip erythema extending over the entire surface of the toe circumferentially with some extension up to the metatarsal head no fluctuance  Assessment: Cellulitis right second toe potentially osteomyelitis  Plan: We will try a course of Keflex for 10 days.  If the toe worsens over the next few days we will schedule him for a an amputation of this toe as well.  Otherwise he will follow-up with me in 2 weeks.  The patient's family is also requesting an appointment with podiatry to trim his thickened toenails on both feet.  Ruta Hinds, MD [facility] [place] Office: [phone] Pager: [phone]

## 2017-08-06 DIAGNOSIS — I482 Chronic atrial fibrillation: Secondary | ICD-10-CM | POA: Diagnosis not present

## 2017-08-06 DIAGNOSIS — E119 Type 2 diabetes mellitus without complications: Secondary | ICD-10-CM | POA: Diagnosis not present

## 2017-08-06 DIAGNOSIS — F419 Anxiety disorder, unspecified: Secondary | ICD-10-CM | POA: Diagnosis not present

## 2017-08-06 DIAGNOSIS — Z89411 Acquired absence of right great toe: Secondary | ICD-10-CM | POA: Diagnosis not present

## 2017-08-06 DIAGNOSIS — D519 Vitamin B12 deficiency anemia, unspecified: Secondary | ICD-10-CM | POA: Diagnosis not present

## 2017-08-06 DIAGNOSIS — Z4781 Encounter for orthopedic aftercare following surgical amputation: Secondary | ICD-10-CM | POA: Diagnosis not present

## 2017-08-09 ENCOUNTER — Other Ambulatory Visit: Payer: Self-pay

## 2017-08-09 ENCOUNTER — Ambulatory Visit (INDEPENDENT_AMBULATORY_CARE_PROVIDER_SITE_OTHER): Payer: Medicare Other | Admitting: *Deleted

## 2017-08-09 ENCOUNTER — Inpatient Hospital Stay (HOSPITAL_COMMUNITY): Payer: Medicare Other | Attending: Hematology

## 2017-08-09 ENCOUNTER — Inpatient Hospital Stay (HOSPITAL_COMMUNITY): Payer: Medicare Other

## 2017-08-09 ENCOUNTER — Encounter (HOSPITAL_COMMUNITY): Payer: Self-pay | Admitting: Hematology

## 2017-08-09 ENCOUNTER — Inpatient Hospital Stay (HOSPITAL_BASED_OUTPATIENT_CLINIC_OR_DEPARTMENT_OTHER): Payer: Medicare Other | Admitting: Hematology

## 2017-08-09 VITALS — BP 128/72 | HR 91 | Temp 97.5°F | Wt 160.3 lb

## 2017-08-09 DIAGNOSIS — C8590 Non-Hodgkin lymphoma, unspecified, unspecified site: Secondary | ICD-10-CM | POA: Diagnosis not present

## 2017-08-09 DIAGNOSIS — Z808 Family history of malignant neoplasm of other organs or systems: Secondary | ICD-10-CM | POA: Diagnosis not present

## 2017-08-09 DIAGNOSIS — Z7984 Long term (current) use of oral hypoglycemic drugs: Secondary | ICD-10-CM | POA: Diagnosis not present

## 2017-08-09 DIAGNOSIS — D472 Monoclonal gammopathy: Secondary | ICD-10-CM | POA: Insufficient documentation

## 2017-08-09 DIAGNOSIS — R5383 Other fatigue: Secondary | ICD-10-CM | POA: Diagnosis not present

## 2017-08-09 DIAGNOSIS — Z8500 Personal history of malignant neoplasm of unspecified digestive organ: Secondary | ICD-10-CM | POA: Insufficient documentation

## 2017-08-09 DIAGNOSIS — C61 Malignant neoplasm of prostate: Secondary | ICD-10-CM

## 2017-08-09 DIAGNOSIS — Z79899 Other long term (current) drug therapy: Secondary | ICD-10-CM

## 2017-08-09 DIAGNOSIS — Z7901 Long term (current) use of anticoagulants: Secondary | ICD-10-CM

## 2017-08-09 DIAGNOSIS — R232 Flushing: Secondary | ICD-10-CM | POA: Diagnosis not present

## 2017-08-09 DIAGNOSIS — D519 Vitamin B12 deficiency anemia, unspecified: Secondary | ICD-10-CM | POA: Diagnosis not present

## 2017-08-09 DIAGNOSIS — I482 Chronic atrial fibrillation: Secondary | ICD-10-CM | POA: Diagnosis not present

## 2017-08-09 DIAGNOSIS — Z803 Family history of malignant neoplasm of breast: Secondary | ICD-10-CM

## 2017-08-09 DIAGNOSIS — E119 Type 2 diabetes mellitus without complications: Secondary | ICD-10-CM | POA: Diagnosis not present

## 2017-08-09 DIAGNOSIS — Z5181 Encounter for therapeutic drug level monitoring: Secondary | ICD-10-CM

## 2017-08-09 DIAGNOSIS — C7951 Secondary malignant neoplasm of bone: Secondary | ICD-10-CM | POA: Diagnosis not present

## 2017-08-09 DIAGNOSIS — I4891 Unspecified atrial fibrillation: Secondary | ICD-10-CM

## 2017-08-09 DIAGNOSIS — Z89411 Acquired absence of right great toe: Secondary | ICD-10-CM | POA: Diagnosis not present

## 2017-08-09 DIAGNOSIS — Z79818 Long term (current) use of other agents affecting estrogen receptors and estrogen levels: Secondary | ICD-10-CM | POA: Diagnosis not present

## 2017-08-09 DIAGNOSIS — Z800 Family history of malignant neoplasm of digestive organs: Secondary | ICD-10-CM | POA: Diagnosis not present

## 2017-08-09 DIAGNOSIS — C49A0 Gastrointestinal stromal tumor, unspecified site: Secondary | ICD-10-CM

## 2017-08-09 DIAGNOSIS — H9190 Unspecified hearing loss, unspecified ear: Secondary | ICD-10-CM | POA: Insufficient documentation

## 2017-08-09 DIAGNOSIS — F419 Anxiety disorder, unspecified: Secondary | ICD-10-CM | POA: Diagnosis not present

## 2017-08-09 DIAGNOSIS — Z4781 Encounter for orthopedic aftercare following surgical amputation: Secondary | ICD-10-CM | POA: Diagnosis not present

## 2017-08-09 LAB — CBC WITH DIFFERENTIAL/PLATELET
Basophils Absolute: 0 10*3/uL (ref 0.0–0.1)
Basophils Relative: 0 %
Eosinophils Absolute: 0 10*3/uL (ref 0.0–0.7)
Eosinophils Relative: 0 %
HCT: 43.1 % (ref 39.0–52.0)
Hemoglobin: 14.6 g/dL (ref 13.0–17.0)
Lymphocytes Relative: 25 %
Lymphs Abs: 1.9 10*3/uL (ref 0.7–4.0)
MCH: 31.1 pg (ref 26.0–34.0)
MCHC: 33.9 g/dL (ref 30.0–36.0)
MCV: 91.9 fL (ref 78.0–100.0)
Monocytes Absolute: 1 10*3/uL (ref 0.1–1.0)
Monocytes Relative: 13 %
Neutro Abs: 4.7 10*3/uL (ref 1.7–7.7)
Neutrophils Relative %: 62 %
Platelets: 167 10*3/uL (ref 150–400)
RBC: 4.69 MIL/uL (ref 4.22–5.81)
RDW: 13.5 % (ref 11.5–15.5)
WBC: 7.6 10*3/uL (ref 4.0–10.5)

## 2017-08-09 LAB — PSA: Prostatic Specific Antigen: <0.01 ng/mL (ref 0.00–4.00)

## 2017-08-09 LAB — COMPREHENSIVE METABOLIC PANEL
ALT: 16 U/L — ABNORMAL LOW (ref 17–63)
AST: 16 U/L (ref 15–41)
Albumin: 4 g/dL (ref 3.5–5.0)
Alkaline Phosphatase: 83 U/L (ref 38–126)
Anion gap: 11 (ref 5–15)
BUN: 16 mg/dL (ref 6–20)
CO2: 27 mmol/L (ref 22–32)
Calcium: 10.1 mg/dL (ref 8.9–10.3)
Chloride: 98 mmol/L — ABNORMAL LOW (ref 101–111)
Creatinine, Ser: 0.73 mg/dL (ref 0.61–1.24)
GFR calc Af Amer: >60 mL/min (ref >60)
GFR calc non Af Amer: >60 mL/min (ref >60)
Glucose, Bld: 155 mg/dL — ABNORMAL HIGH (ref 65–99)
Potassium: 3.9 mmol/L (ref 3.5–5.1)
Sodium: 136 mmol/L (ref 135–145)
Total Bilirubin: 1 mg/dL (ref 0.3–1.2)
Total Protein: 7.2 g/dL (ref 6.5–8.1)

## 2017-08-09 LAB — POCT INR: INR: 1.7

## 2017-08-09 LAB — LACTATE DEHYDROGENASE: LDH: 131 U/L (ref 98–192)

## 2017-08-09 MED ORDER — DENOSUMAB 120 MG/1.7ML ~~LOC~~ SOLN
120.0000 mg | Freq: Once | SUBCUTANEOUS | Status: AC
  Administered 2017-08-09: 120 mg via SUBCUTANEOUS
  Filled 2017-08-09: qty 1.7

## 2017-08-09 NOTE — Progress Notes (Signed)
[name], [address] [place] [address]   CLINIC:  Medical Oncology/Hematology  PCP:  Rosita Fire, MD [place] [address] [phone]   REASON FOR VISIT:  Follow-up for metastatic prostate cancer.  CURRENT THERAPY: Zytiga/prednisone and Lupron and denosumab  BRIEF ONCOLOGIC HISTORY:    GIST (gastrointestinal stromal tumor), malignant ([place])   08/18/2011 Initial Diagnosis    GIST (gastrointestinal stromal tumor), malignant ([place])      02/08/2017 Genetic Testing    ATM Gain (Exons 62-63) VUS identified on the multi-gene panel.  The Multi-Gene Panel offered by [facility] includes sequencing and/or deletion duplication testing of the following 80 genes: ALK, APC, ATM, AXIN2,BAP1,  BARD1, BLM, BMPR1A, BRCA1, BRCA2, BRIP1, CASR, CDC73, CDH1, CDK4, CDKN1B, CDKN1C, CDKN2A (p14ARF), CDKN2A (p16INK4a), CEBPA, CHEK2, CTNNA1, DICER1, DIS3L2, EGFR (c.2369C>T, p.Thr790Met variant only), EPCAM (Deletion/duplication testing only), FH, FLCN, GATA2, GPC3, GREM1 (Promoter region deletion/duplication testing only), HOXB13 (c.251G>A, p.Gly84Glu), HRAS, KIT, MAX, MEN1, MET, MITF (c.952G>A, p.Glu318Lys variant only), MLH1, MSH2, MSH3, MSH6, MUTYH, NBN, NF1, NF2, NTHL1, PALB2, PDGFRA, PHOX2B, PMS2, POLD1, POLE, POT1, PRKAR1A, PTCH1, PTEN, RAD50, RAD51C, RAD51D, RB1, RECQL4, RET, RUNX1, SDHAF2, SDHA (sequence changes only), SDHB, SDHC, SDHD, SMAD4, SMARCA4, SMARCB1, SMARCE1, STK11, SUFU, TERT, TERT, TMEM127, TP53, TSC1, TSC2, VHL, WRN and WT1.  The report date is February 08, 2017.        Prostate cancer ([place])   09/08/2013 Initial Diagnosis    Prostate cancer ([place])      02/08/2017 Genetic Testing    ATM Gain (Exons 62-63) VUS identified on the multi-gene panel.  The Multi-Gene Panel offered by [facility] includes sequencing and/or deletion duplication testing of the following 80 genes: ALK, APC, ATM, AXIN2,BAP1,  BARD1, BLM, BMPR1A, BRCA1, BRCA2, BRIP1, CASR,  CDC73, CDH1, CDK4, CDKN1B, CDKN1C, CDKN2A (p14ARF), CDKN2A (p16INK4a), CEBPA, CHEK2, CTNNA1, DICER1, DIS3L2, EGFR (c.2369C>T, p.Thr790Met variant only), EPCAM (Deletion/duplication testing only), FH, FLCN, GATA2, GPC3, GREM1 (Promoter region deletion/duplication testing only), HOXB13 (c.251G>A, p.Gly84Glu), HRAS, KIT, MAX, MEN1, MET, MITF (c.952G>A, p.Glu318Lys variant only), MLH1, MSH2, MSH3, MSH6, MUTYH, NBN, NF1, NF2, NTHL1, PALB2, PDGFRA, PHOX2B, PMS2, POLD1, POLE, POT1, PRKAR1A, PTCH1, PTEN, RAD50, RAD51C, RAD51D, RB1, RECQL4, RET, RUNX1, SDHAF2, SDHA (sequence changes only), SDHB, SDHC, SDHD, SMAD4, SMARCA4, SMARCB1, SMARCE1, STK11, SUFU, TERT, TERT, TMEM127, TP53, TSC1, TSC2, VHL, WRN and WT1.  The report date is February 08, 2017.         CANCER STAGING: Cancer Staging NHL (non-Hodgkin's lymphoma) (HCC) Staging form: Lymphoid Neoplasms, AJCC 6th Edition - Clinical: Stage III - Signed by Baird Cancer, PA on 08/18/2011    INTERVAL HISTORY:  [name] 84 y.o. male returns for routine follow-up of his metastatic prostate cancer to the bones.  He denies any new onset pains.  He is tolerating denosumab very well.  He also taking prednisone 5 mg twice daily.  He is having difficulty controlling his blood sugars.  He denies any fevers or infections.  He denies any recent hospitalizations.  He is continuing calcium and vitamin D events.  He denies any fevers, night sweats or weight loss in the last 6 months.  No abdominal pains were reported.  He is accompanied by his wife and daughter today.    REVIEW OF SYSTEMS:  Review of Systems  Constitutional: Positive for fatigue.  HENT:   Positive for hearing loss.   Eyes: Negative.   Respiratory: Negative.   Cardiovascular: Negative.   Gastrointestinal: Negative.   Endocrine: Positive for hot flashes.  Genitourinary: Negative.    Musculoskeletal: Negative.   Skin: Negative.   Neurological: Negative.   Hematological: Negative.     Psychiatric/Behavioral: Negative.      PAST MEDICAL/SURGICAL HISTORY:  Past Medical History:  Diagnosis Date  . Atrial fibrillation ([place])    Dr. Harl Bowie- [facility] follows saw 11'14  . Bladder stones    tx. with oral meds and antibiotics, now surgery planned  . Cataracts, both eyes    surgery planned May 2015  . Diabetes mellitus without complication ([place])   . Family history of breast cancer   . Family history of colon cancer   . GIST (gastrointestinal stromal tumor), malignant ([place]) 08/18/2011   Gastrointestinal stromal tumor, that is GIST, small bowel, 4.5 cm, intermediate prognostic grade found on the PET scan in the small bowel, accounting for that small bowel activity in October 2005 with resection by Dr. Margot Chimes, thus far without recurrence.   . Hypertension   . NHL (non-Hodgkin's lymphoma) ([place]) 08/18/2011   Diffuse large B-cell lymphoma, clinically stage IIIA, CD20 positive, status post cervical lymph node biopsy 08/03/2003 on the left. PET scan was also positive in the spleen and small bowel region, but bone marrow aspiration and biopsy were negative. So he essentially had stage IIIAs. He received R-CHOP x6 cycles with CR established by PET scan criteria on 11/23/2003 with no evidence for relapse th   Past Surgical History:  Procedure Laterality Date  . AMPUTATION Left 06/29/2017   Procedure: AMPUTATION LEFT GREAT TOE;  Surgeon: [name], MD;  Location: [facility];  Service: Vascular;  Laterality: Left;  . CATARACT EXTRACTION Right 08/2013  . CHOLECYSTECTOMY    . CYSTOSCOPY WITH LITHOLAPAXY N/A 08/10/2013   Procedure: CYSTOSCOPY WITH LITHOLAPAXY WITH [name];  Surgeon: [name], MD;  [facility];  Service: Urology;  Laterality: N/A;  . ESOPHAGEAL DILATION N/A 02/27/2016   Procedure: ESOPHAGEAL DILATION;  Surgeon: [name], MD;  [facility];  Service: Endoscopy;  Laterality: N/A;  . ESOPHAGOGASTRODUODENOSCOPY N/A 02/27/2016   Procedure:  ESOPHAGOGASTRODUODENOSCOPY (EGD);  Surgeon: [name], MD;  [facility];  Service: Endoscopy;  Laterality: N/A;  12:00  . INCISION AND DRAINAGE PERIRECTAL ABSCESS    . LOWER EXTREMITY ANGIOGRAPHY N/A 06/25/2017   Procedure: LOWER EXTREMITY ANGIOGRAPHY;  Surgeon: [name], MD;  Location: [facility];  Service: Cardiovascular;  Laterality: N/A;  . LYMPH NODE DISSECTION Left    '05  . PORT-A-CATH REMOVAL    . PORTACATH PLACEMENT     insertion and removal -last chemotherapy 10 yrs ago  . TRANSURETHRAL RESECTION OF PROSTATE N/A 08/10/2013   Procedure: TRANSURETHRAL RESECTION OF THE PROSTATE WITH GYRUS INSTRUMENTS;  Surgeon: [name], MD;  [facility];  Service: Urology;  Laterality: N/A;     SOCIAL HISTORY:  Social History   Socioeconomic History  . Marital status: Married    Spouse name: Not on file  . Number of children: Not on file  . Years of education: Not on file  . Highest education level: Not on file  Occupational History  . Occupation: retired    Fish farm manager: RETIRED    Comment: salesman  Social Needs  . Financial resource strain: Not on file  . Food insecurity:    Worry: Not on file    Inability: Not on file  . Transportation needs:    Medical: Not on file    Non-medical: Not on file  Tobacco Use  . Smoking status: Never  Smoker  . Smokeless tobacco: Never Used  Substance and Sexual Activity  . Alcohol use: No  . Drug use: No  . Sexual activity: Not Currently  Lifestyle  . Physical activity:    Days per week: Not on file    Minutes per session: Not on file  . Stress: Not on file  Relationships  . Social connections:    Talks on phone: Not on file    Gets together: Not on file    Attends religious service: Not on file    Active member of club or organization: Not on file    Attends meetings of clubs or organizations: Not on file    Relationship status: Not on file  . Intimate partner violence:    Fear of current or  ex partner: Not on file    Emotionally abused: Not on file    Physically abused: Not on file    Forced sexual activity: Not on file  Other Topics Concern  . Not on file  Social History Narrative  . Not on file    FAMILY HISTORY:  Family History  Problem Relation Age of Onset  . Heart attack Mother        died in her 62s  . Other Father 43       pneumonia - died  . Breast cancer Sister        dx in her 49s-60s  . Colon cancer Brother        dx in his 66s  . Cancer Sister        TAH/BSO due to "male cancer"  . Breast cancer Daughter 84  . Breast cancer Daughter 34       DCIS - ATM VUS on [facility] 83 gene panel in 2018  . Breast cancer Daughter 35       DCIS - Negative on [facility] MyRisk 25 gene apnel in 2015  . Thyroid cancer Daughter 69       Medullary and Papillary  . Thyroid nodules Grandchild     CURRENT MEDICATIONS:  Outpatient Encounter Medications as of 08/09/2017  Medication Sig Note  . abiraterone acetate (ZYTIGA) 250 MG tablet Take 4 tablets (1,000 mg total) by mouth daily. Take on an empty stomach 1 hour before or 2 hours after a meal   . acetaminophen (TYLENOL) 500 MG tablet Take 500-1,000 mg by mouth daily as needed for mild pain, fever or headache.   Marland Kitchen amitriptyline (ELAVIL) 25 MG tablet Take 25 mg by mouth at bedtime.   Marland Kitchen CALCIUM CARB-CHOLECALCIFEROL PO Take 1 tablet by mouth daily.  06/21/2017: '1200mg'$ /'1600mg'$   . cephALEXin (KEFLEX) 500 MG capsule Take 1 capsule (500 mg total) by mouth 3 (three) times daily.   . clotrimazole-betamethasone (LOTRISONE) cream Apply 1 application topically daily as needed (for fungus).    . CRANBERRY PO Take 4,200 mg by mouth daily.   . cyanocobalamin (,VITAMIN B-12,) 1000 MCG/ML injection Inject 1,000 mcg every 30 (thirty) days into the skin.   Marland Kitchen denosumab (XGEVA) 120 MG/1.7ML SOLN injection Inject 120 mg into the skin every 30 (thirty) days.    Marland Kitchen diltiazem (CARDIZEM) 90 MG tablet TAKE 90 MG BY MOUTH TWICE DAILY   . diphenhydrAMINE  (BENADRYL) 25 MG tablet Take 25 mg by mouth every 6 (six) hours as needed for itching.   . docusate sodium (COLACE) 100 MG capsule Take 100 mg by mouth daily as needed for mild constipation.   . furosemide (LASIX) 20 MG tablet Take 1 tablet (  20 mg total) by mouth daily.   Marland Kitchen glipiZIDE (GLUCOTROL XL) 5 MG 24 hr tablet Take 5 mg by mouth daily with breakfast.    . guaiFENesin (MUCINEX) 600 MG 12 hr tablet Take 1 tablet (600 mg total) by mouth 2 (two) times daily. (Patient taking differently: Take 600 mg by mouth 2 (two) times daily as needed for cough or to loosen phlegm. )   . Leuprolide Acetate, 6 Month, (LUPRON DEPOT) 45 MG injection Inject 45 mg into the muscle every 6 (six) months.   Marland Kitchen lisinopril (PRINIVIL,ZESTRIL) 20 MG tablet Take 1 tablet (20 mg total) daily by mouth.   . loperamide (IMODIUM) 2 MG capsule Take 2 mg by mouth as needed for diarrhea or loose stools.   . metFORMIN (GLUCOPHAGE) 850 MG tablet Take 850 mg by mouth 2 (two) times daily with a meal.  06/28/2017: On hold as of 06/24/2017  . nystatin (MYCOSTATIN) powder Apply 1 g topically 4 (four) times daily as needed (fungus).    Marland Kitchen omeprazole (PRILOSEC) 20 MG capsule Take 20 mg by mouth daily.    Marland Kitchen oxyCODONE (OXY IR/ROXICODONE) 5 MG immediate release tablet Take 1 tablet (5 mg total) by mouth every 6 (six) hours as needed for moderate pain.   . phenylephrine-shark liver oil-mineral oil-petrolatum (PREPARATION H) 0.25-3-14-71.9 % rectal ointment Place 1 application rectally 2 (two) times daily as needed (for chaffing).   . potassium chloride (K-DUR,KLOR-CON) 10 MEQ tablet Take 2 tablets (20 mEq total) by mouth daily.   . predniSONE (DELTASONE) 5 MG tablet Take 1 tablet (5 mg total) by mouth 2 (two) times daily with a meal.   . sennosides-docusate sodium (SENOKOT-S) 8.6-50 MG tablet Take 1 tablet by mouth daily as needed for constipation.    . VENTOLIN HFA 108 (90 Base) MCG/ACT inhaler Inhale 1-2 puffs into the lungs every 6 (six) hours as  needed for wheezing or shortness of breath.    . warfarin (COUMADIN) 2.5 MG tablet Take as directed by [facility].  RESUME ON Tuesday 06/29/17 AFTER YOUR TOE AMPUTATION 06/28/2017: On hold due to procedures  . Cholecalciferol (VITAMIN D3) 1000 units CAPS Take 1,000 Units by mouth daily.    No facility-administered encounter medications on file as of 08/09/2017.     ALLERGIES:  Allergies  Allergen Reactions  . Augmentin [Amoxicillin-Pot Clavulanate] Rash    Redness of skin     PHYSICAL EXAM:  ECOG Performance status: 2  Vitals:   08/09/17 1352  BP: 128/72  Pulse: 91  Temp: (!) 97.5 F (36.4 C)  SpO2: 100%   Filed Weights   08/09/17 1352  Weight: 160 lb 4.8 oz (72.7 kg)    Physical Exam   LABORATORY DATA:  I have reviewed the labs as listed.  CBC    Component Value Date/Time   WBC 7.6 08/09/2017 1330   RBC 4.69 08/09/2017 1330   HGB 14.6 08/09/2017 1330   HCT 43.1 08/09/2017 1330   PLT 167 08/09/2017 1330   MCV 91.9 08/09/2017 1330   MCH 31.1 08/09/2017 1330   MCHC 33.9 08/09/2017 1330   RDW 13.5 08/09/2017 1330   LYMPHSABS 1.9 08/09/2017 1330   MONOABS 1.0 08/09/2017 1330   EOSABS 0.0 08/09/2017 1330   BASOSABS 0.0 08/09/2017 1330   CMP Latest Ref Rng & Units 08/09/2017 06/29/2017 06/25/2017  Glucose 65 - 99 mg/dL 155(H) 321(H) 195(H)  BUN 6 - 20 mg/dL 16 16 17   Creatinine 0.61 - 1.24 mg/dL 0.73 0.78 0.60(L)  Sodium  135 - 145 mmol/L 136 138 140  Potassium 3.5 - 5.1 mmol/L 3.9 4.0 3.9  Chloride 101 - 111 mmol/L 98(L) 103 99(L)  CO2 22 - 32 mmol/L 27 24 -  Calcium 8.9 - 10.3 mg/dL 10.1 9.5 -  Total Protein 6.5 - 8.1 g/dL 7.2 - -  Total Bilirubin 0.3 - 1.2 mg/dL 1.0 - -  Alkaline Phos 38 - 126 U/L 83 - -  AST 15 - 41 U/L 16 - -  ALT 17 - 63 U/L 16(L) - -       DIAGNOSTIC IMAGING:  I have independently reviewed his bone scan and CT scan and agree with the radiology report.     ASSESSMENT & PLAN:   Prostate cancer ([place]) 1.  Stage IV prostate  cancer with bone metastases: Diagnosed in April 2015. -Started on Abiraterone and prednisone in October 2016, tolerating very well.  He is on every 72-monthLupron injections.  I have discussed the results of the bone scan dated 08/02/2017 which shows interval improvement in T12 lesion.  No new lesions were seen.  CT scan on the same day showed some pancreatic calcifications but did not show any new lesions.  He is having difficulty managing his blood sugars.  I will cut back on the prednisone to 2.5 mg twice daily.  We will follow-up on the PSA level today.  We will see him back in 2 months for follow-up.  2.  Bone metastasis: We will continue denosumab monthly injections.  3.  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  The spike from today is pending.  We will follow-up on it.  4.  DL BCL: He was originally diagnosed in 2005.  He does not have any symptoms or lymphadenopathy on the scan.  5.  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  No evidence of recurrence.      Orders placed this encounter:  Orders Placed This Encounter  Procedures  . CBC  . Comprehensive metabolic panel  . PSA      SDerek Jack MD [phone]

## 2017-08-09 NOTE — Assessment & Plan Note (Signed)
1.  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 -Started on Abiraterone and prednisone in October 2016, tolerating very well.  He is on every 61-month Lupron injections.  I have discussed the results of the bone scan dated 08/02/2017 which shows interval improvement in T12 lesion.  No new lesions were seen.  CT scan on the same day showed some pancreatic calcifications but did not show any new lesions.  He is having difficulty managing his blood sugars.  I will cut back on the prednisone to 2.5 mg twice daily.  We will follow-up on the PSA level today.  We will see him back in 2 months for follow-up.  2.  Bone metastasis: We will continue denosumab monthly injections.  3.  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  The spike from today is pending.  We will follow-up on it.  4.  DL BCL: He was originally diagnosed in 2005.  He does not have any symptoms or lymphadenopathy on the scan.  5.  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  No evidence of recurrence.

## 2017-08-09 NOTE — Patient Instructions (Signed)
[facility] at [facility] Discharge Instructions  Today you saw Dr. K.   Thank you for choosing [facility] at [facility] to provide your oncology and hematology care.  To afford each patient quality time with our provider, please arrive at least 15 minutes before your scheduled appointment time.   If you have a lab appointment with the Cancer Center please come in thru the  Main Entrance and check in at the main information desk  You need to re-schedule your appointment should you arrive 10 or more minutes late.  We strive to give you quality time with our providers, and arriving late affects you and other patients whose appointments are after yours.  Also, if you no show three or more times for appointments you may be dismissed from the clinic at the providers discretion.     Again, thank you for choosing [facility].  Our hope is that these requests will decrease the amount of time that you wait before being seen by our physicians.       _____________________________________________________________  Should you have questions after your visit to  Cancer Center, please contact our office at [phone] between the hours of 8:30 a.m. and 4:30 p.m.  Voicemails left after 4:30 p.m. will not be returned until the following business day.  For prescription refill requests, have your pharmacy contact our office.       Resources For Cancer Patients and their Caregivers ? American Cancer Society: Can assist with transportation, wigs, general needs, runs Look Good Feel Better.        1-888-227-6333 ? Cancer Care: Provides financial assistance, online support groups, medication/co-pay assistance.  1-800-813-HOPE (4673) ? [facility] Assists [place] cancer patients and their families through emotional , educational and financial support.  336-427-4357 ? [place] DSS Where to apply for food  stamps, Medicaid and utility assistance. 336-342-1394 ? [facility]: Transportation to medical appointments. 336-347-2287 ? Social Security Administration: May apply for disability if have a Stage IV cancer. 336-342-7796 1-800-772-1213 ? [place] Aging, Disability and Transit Services: Assists with nutrition, care and transit needs. 336-349-2343  Cancer Center Support Programs:   > Cancer Support Group  2nd Tuesday of the month 1pm-2pm, Journey Room   > Creative Journey  3rd Tuesday of the month 1130am-1pm, Journey Room    

## 2017-08-09 NOTE — Patient Instructions (Signed)
[facility] at [facility] Discharge Instructions  Today you saw Dr. K.   Thank you for choosing [facility] at [facility] to provide your oncology and hematology care.  To afford each patient quality time with our provider, please arrive at least 15 minutes before your scheduled appointment time.   If you have a lab appointment with the Cancer Center please come in thru the  Main Entrance and check in at the main information desk  You need to re-schedule your appointment should you arrive 10 or more minutes late.  We strive to give you quality time with our providers, and arriving late affects you and other patients whose appointments are after yours.  Also, if you no show three or more times for appointments you may be dismissed from the clinic at the providers discretion.     Again, thank you for choosing [facility].  Our hope is that these requests will decrease the amount of time that you wait before being seen by our physicians.       _____________________________________________________________  Should you have questions after your visit to [facility], please contact our office at [phone] between the hours of 8:30 a.m. and 4:30 p.m.  Voicemails left after 4:30 p.m. will not be returned until the following business day.  For prescription refill requests, have your pharmacy contact our office.       Resources For Cancer Patients and their Caregivers ? American Cancer Society: Can assist with transportation, wigs, general needs, runs Look Good Feel Better.        1-888-227-6333 ? Cancer Care: Provides financial assistance, online support groups, medication/co-pay assistance.  1-800-813-HOPE (4673) ? [facility] Assists [place] cancer patients and their families through emotional , educational and financial support.  336-427-4357 ? [place] DSS Where to apply for food  stamps, Medicaid and utility assistance. 336-342-1394 ? RCATS: Transportation to medical appointments. 336-347-2287 ? Social Security Administration: May apply for disability if have a Stage IV cancer. 336-342-7796 1-800-772-1213 ? [place] Aging, Disability and Transit Services: Assists with nutrition, care and transit needs. 336-349-2343  Cancer Center Support Programs:   > Cancer Support Group  2nd Tuesday of the month 1pm-2pm, Journey Room   > Creative Journey  3rd Tuesday of the month 1130am-1pm, Journey Room    

## 2017-08-09 NOTE — Patient Instructions (Signed)
Take 3 tablets tonight then resume 2 tablets daily except 1 tablet on Tuesdays and Fridays.   Recheck in 1 week Order given to [place]

## 2017-08-09 NOTE — Progress Notes (Signed)
[name] presents today for injection per MD orders. Xgeva 120 mg  administered SQ in left Abdomen. Administration without incident. Patient tolerated well.

## 2017-08-10 LAB — IGG, IGA, IGM
IgA: 157 mg/dL (ref 61–437)
IgG (Immunoglobin G), Serum: 650 mg/dL — ABNORMAL LOW (ref 700–1600)
IgM (Immunoglobulin M), Srm: 437 mg/dL — ABNORMAL HIGH (ref 15–143)

## 2017-08-10 LAB — KAPPA/LAMBDA LIGHT CHAINS
Kappa free light chain: 24.7 mg/L — ABNORMAL HIGH (ref 3.3–19.4)
Kappa, lambda light chain ratio: 0.56 (ref 0.26–1.65)
Lambda free light chains: 44.2 mg/L — ABNORMAL HIGH (ref 5.7–26.3)

## 2017-08-10 LAB — BETA 2 MICROGLOBULIN, SERUM: Beta-2 Microglobulin: 2.3 mg/L (ref 0.6–2.4)

## 2017-08-11 ENCOUNTER — Other Ambulatory Visit: Payer: Self-pay | Admitting: *Deleted

## 2017-08-11 LAB — PROTEIN ELECTROPHORESIS, SERUM
A/G Ratio: 1.1 (ref 0.7–1.7)
Albumin ELP: 3.5 g/dL (ref 2.9–4.4)
Alpha-1-Globulin: 0.3 g/dL (ref 0.0–0.4)
Alpha-2-Globulin: 0.9 g/dL (ref 0.4–1.0)
Beta Globulin: 1 g/dL (ref 0.7–1.3)
Gamma Globulin: 0.9 g/dL (ref 0.4–1.8)
Globulin, Total: 3.1 g/dL (ref 2.2–3.9)
M-Spike, %: 0.3 g/dL — ABNORMAL HIGH
Total Protein ELP: 6.6 g/dL (ref 6.0–8.5)

## 2017-08-11 LAB — IMMUNOFIXATION ELECTROPHORESIS
IgA: 164 mg/dL (ref 61–437)
IgG (Immunoglobin G), Serum: 662 mg/dL — ABNORMAL LOW (ref 700–1600)
IgM (Immunoglobulin M), Srm: 424 mg/dL — ABNORMAL HIGH (ref 15–143)
Total Protein ELP: 6.6 g/dL (ref 6.0–8.5)

## 2017-08-11 MED ORDER — WARFARIN SODIUM 2.5 MG PO TABS: ORAL_TABLET | ORAL | 6 refills | Status: DC

## 2017-08-13 ENCOUNTER — Telehealth: Payer: Self-pay | Admitting: *Deleted

## 2017-08-13 DIAGNOSIS — F419 Anxiety disorder, unspecified: Secondary | ICD-10-CM | POA: Diagnosis not present

## 2017-08-13 DIAGNOSIS — E119 Type 2 diabetes mellitus without complications: Secondary | ICD-10-CM | POA: Diagnosis not present

## 2017-08-13 DIAGNOSIS — Z4781 Encounter for orthopedic aftercare following surgical amputation: Secondary | ICD-10-CM | POA: Diagnosis not present

## 2017-08-13 DIAGNOSIS — D519 Vitamin B12 deficiency anemia, unspecified: Secondary | ICD-10-CM | POA: Diagnosis not present

## 2017-08-13 DIAGNOSIS — I482 Chronic atrial fibrillation: Secondary | ICD-10-CM | POA: Diagnosis not present

## 2017-08-13 DIAGNOSIS — Z89411 Acquired absence of right great toe: Secondary | ICD-10-CM | POA: Diagnosis not present

## 2017-08-13 NOTE — Telephone Encounter (Signed)
Returned call to daughter.  She sates that her mom feels the toe is looking somewhat better, no drainage noted and no fever.  Antibiotic will end on Sunday.  They are to call the office if the toe worsens per Dr Oneida Alar instructions.

## 2017-08-16 ENCOUNTER — Ambulatory Visit (INDEPENDENT_AMBULATORY_CARE_PROVIDER_SITE_OTHER): Payer: Medicare Other | Admitting: *Deleted

## 2017-08-16 ENCOUNTER — Telehealth: Payer: Self-pay | Admitting: *Deleted

## 2017-08-16 DIAGNOSIS — F419 Anxiety disorder, unspecified: Secondary | ICD-10-CM | POA: Diagnosis not present

## 2017-08-16 DIAGNOSIS — I4891 Unspecified atrial fibrillation: Secondary | ICD-10-CM

## 2017-08-16 DIAGNOSIS — Z89411 Acquired absence of right great toe: Secondary | ICD-10-CM | POA: Diagnosis not present

## 2017-08-16 DIAGNOSIS — I482 Chronic atrial fibrillation: Secondary | ICD-10-CM | POA: Diagnosis not present

## 2017-08-16 DIAGNOSIS — Z4781 Encounter for orthopedic aftercare following surgical amputation: Secondary | ICD-10-CM | POA: Diagnosis not present

## 2017-08-16 DIAGNOSIS — D519 Vitamin B12 deficiency anemia, unspecified: Secondary | ICD-10-CM | POA: Diagnosis not present

## 2017-08-16 DIAGNOSIS — Z5181 Encounter for therapeutic drug level monitoring: Secondary | ICD-10-CM

## 2017-08-16 DIAGNOSIS — E119 Type 2 diabetes mellitus without complications: Secondary | ICD-10-CM | POA: Diagnosis not present

## 2017-08-16 LAB — POCT INR: INR: 1.9

## 2017-08-16 NOTE — Patient Instructions (Signed)
Increase coumadin to 2 tablets daily except 1 tablet on Fridays.   Recheck in 1 week Order given to [place]

## 2017-08-16 NOTE — Telephone Encounter (Signed)
Per Judeen Hammans w/ [facility] INR 1.9

## 2017-08-16 NOTE — Telephone Encounter (Signed)
Done.  See coumadin note. 

## 2017-08-16 NOTE — Telephone Encounter (Signed)
Previous note

## 2017-08-18 DIAGNOSIS — F419 Anxiety disorder, unspecified: Secondary | ICD-10-CM | POA: Diagnosis not present

## 2017-08-18 DIAGNOSIS — Z4781 Encounter for orthopedic aftercare following surgical amputation: Secondary | ICD-10-CM | POA: Diagnosis not present

## 2017-08-18 DIAGNOSIS — D519 Vitamin B12 deficiency anemia, unspecified: Secondary | ICD-10-CM | POA: Diagnosis not present

## 2017-08-18 DIAGNOSIS — Z89411 Acquired absence of right great toe: Secondary | ICD-10-CM | POA: Diagnosis not present

## 2017-08-18 DIAGNOSIS — E119 Type 2 diabetes mellitus without complications: Secondary | ICD-10-CM | POA: Diagnosis not present

## 2017-08-18 DIAGNOSIS — I482 Chronic atrial fibrillation: Secondary | ICD-10-CM | POA: Diagnosis not present

## 2017-08-20 DIAGNOSIS — E119 Type 2 diabetes mellitus without complications: Secondary | ICD-10-CM | POA: Diagnosis not present

## 2017-08-20 DIAGNOSIS — I482 Chronic atrial fibrillation: Secondary | ICD-10-CM | POA: Diagnosis not present

## 2017-08-20 DIAGNOSIS — D519 Vitamin B12 deficiency anemia, unspecified: Secondary | ICD-10-CM | POA: Diagnosis not present

## 2017-08-20 DIAGNOSIS — Z89411 Acquired absence of right great toe: Secondary | ICD-10-CM | POA: Diagnosis not present

## 2017-08-20 DIAGNOSIS — Z4781 Encounter for orthopedic aftercare following surgical amputation: Secondary | ICD-10-CM | POA: Diagnosis not present

## 2017-08-20 DIAGNOSIS — F419 Anxiety disorder, unspecified: Secondary | ICD-10-CM | POA: Diagnosis not present

## 2017-08-23 ENCOUNTER — Ambulatory Visit (INDEPENDENT_AMBULATORY_CARE_PROVIDER_SITE_OTHER): Payer: Medicare Other | Admitting: *Deleted

## 2017-08-23 DIAGNOSIS — I482 Chronic atrial fibrillation: Secondary | ICD-10-CM | POA: Diagnosis not present

## 2017-08-23 DIAGNOSIS — I4891 Unspecified atrial fibrillation: Secondary | ICD-10-CM | POA: Diagnosis not present

## 2017-08-23 DIAGNOSIS — E119 Type 2 diabetes mellitus without complications: Secondary | ICD-10-CM | POA: Diagnosis not present

## 2017-08-23 DIAGNOSIS — D519 Vitamin B12 deficiency anemia, unspecified: Secondary | ICD-10-CM | POA: Diagnosis not present

## 2017-08-23 DIAGNOSIS — B351 Tinea unguium: Secondary | ICD-10-CM | POA: Diagnosis not present

## 2017-08-23 DIAGNOSIS — F419 Anxiety disorder, unspecified: Secondary | ICD-10-CM | POA: Diagnosis not present

## 2017-08-23 DIAGNOSIS — Z5181 Encounter for therapeutic drug level monitoring: Secondary | ICD-10-CM | POA: Diagnosis not present

## 2017-08-23 DIAGNOSIS — Z89411 Acquired absence of right great toe: Secondary | ICD-10-CM | POA: Diagnosis not present

## 2017-08-23 DIAGNOSIS — L97529 Non-pressure chronic ulcer of other part of left foot with unspecified severity: Secondary | ICD-10-CM | POA: Diagnosis not present

## 2017-08-23 DIAGNOSIS — Z4781 Encounter for orthopedic aftercare following surgical amputation: Secondary | ICD-10-CM | POA: Diagnosis not present

## 2017-08-23 DIAGNOSIS — E1142 Type 2 diabetes mellitus with diabetic polyneuropathy: Secondary | ICD-10-CM | POA: Diagnosis not present

## 2017-08-23 LAB — POCT INR: INR: 2.5

## 2017-08-23 NOTE — Patient Instructions (Signed)
Continue coumadin 2 tablets daily except 1 tablet on Fridays.   Recheck in 1 week.  Pending amputation of toe.  Not scheduled yet. Order given to [place]

## 2017-08-24 ENCOUNTER — Other Ambulatory Visit: Payer: Self-pay | Admitting: *Deleted

## 2017-08-26 ENCOUNTER — Other Ambulatory Visit: Payer: Self-pay

## 2017-08-26 ENCOUNTER — Ambulatory Visit: Payer: Medicare Other | Admitting: Vascular Surgery

## 2017-08-26 ENCOUNTER — Encounter (HOSPITAL_COMMUNITY): Payer: Self-pay | Admitting: *Deleted

## 2017-08-26 MED ORDER — VANCOMYCIN HCL IN DEXTROSE 1-5 GM/200ML-% IV SOLN
1000.0000 mg | INTRAVENOUS | Status: AC
  Administered 2017-08-27: 1000 mg via INTRAVENOUS
  Filled 2017-08-26: qty 200

## 2017-08-26 NOTE — Progress Notes (Signed)
I spoke with [name], patient's daughter- as I was instructed to do.  [name] re[ports that patient does not have chest pain, does get short of breath with activity, "he has been much weaker since January of this week. [name] reports that patient's CBGs run < 150, never < 70. I Ed Blalock to have patient eat a snack prior to going to bed.  I instructed her to have patient to check CBG after awaking and every 2 hours until arrival  to the hospital.  I Instructed that if  patient if CBG is less than 70 drink 1/2 cup of a clear juice. Recheck CBG in 15 minutes then call pre- op desk at [phone] for further instructions.  Do Not take diabetic medication the morning of surgery.

## 2017-08-27 ENCOUNTER — Ambulatory Visit (HOSPITAL_COMMUNITY): Payer: Medicare Other | Admitting: Anesthesiology

## 2017-08-27 ENCOUNTER — Encounter (HOSPITAL_COMMUNITY): Payer: Self-pay | Admitting: Surgery

## 2017-08-27 ENCOUNTER — Telehealth: Payer: Self-pay | Admitting: Vascular Surgery

## 2017-08-27 ENCOUNTER — Observation Stay (HOSPITAL_COMMUNITY)
Admission: RE | Admit: 2017-08-27 | Discharge: 2017-08-28 | Disposition: A | Discharge status: Home / Self Care | Payer: Medicare Other | Source: Ambulatory Visit | Attending: Vascular Surgery | Admitting: Vascular Surgery

## 2017-08-27 ENCOUNTER — Encounter (HOSPITAL_COMMUNITY)
Admission: RE | Disposition: A | Discharge status: Home / Self Care | Payer: Self-pay | Source: Ambulatory Visit | Attending: Vascular Surgery

## 2017-08-27 DIAGNOSIS — M868X7 Other osteomyelitis, ankle and foot: Principal | ICD-10-CM | POA: Insufficient documentation

## 2017-08-27 DIAGNOSIS — E1151 Type 2 diabetes mellitus with diabetic peripheral angiopathy without gangrene: Secondary | ICD-10-CM | POA: Diagnosis not present

## 2017-08-27 DIAGNOSIS — R791 Abnormal coagulation profile: Secondary | ICD-10-CM | POA: Insufficient documentation

## 2017-08-27 DIAGNOSIS — Z880 Allergy status to penicillin: Secondary | ICD-10-CM | POA: Insufficient documentation

## 2017-08-27 DIAGNOSIS — Z79899 Other long term (current) drug therapy: Secondary | ICD-10-CM | POA: Insufficient documentation

## 2017-08-27 DIAGNOSIS — L03032 Cellulitis of left toe: Secondary | ICD-10-CM | POA: Insufficient documentation

## 2017-08-27 DIAGNOSIS — I10 Essential (primary) hypertension: Secondary | ICD-10-CM | POA: Diagnosis not present

## 2017-08-27 DIAGNOSIS — Z89412 Acquired absence of left great toe: Secondary | ICD-10-CM | POA: Insufficient documentation

## 2017-08-27 DIAGNOSIS — E1169 Type 2 diabetes mellitus with other specified complication: Secondary | ICD-10-CM | POA: Diagnosis not present

## 2017-08-27 DIAGNOSIS — M869 Osteomyelitis, unspecified: Secondary | ICD-10-CM | POA: Diagnosis not present

## 2017-08-27 DIAGNOSIS — I739 Peripheral vascular disease, unspecified: Secondary | ICD-10-CM | POA: Diagnosis present

## 2017-08-27 DIAGNOSIS — Z7984 Long term (current) use of oral hypoglycemic drugs: Secondary | ICD-10-CM | POA: Insufficient documentation

## 2017-08-27 HISTORY — DX: Unspecified osteoarthritis, unspecified site: M19.90

## 2017-08-27 HISTORY — DX: Peripheral vascular disease, unspecified: I73.9

## 2017-08-27 HISTORY — DX: Dyspnea, unspecified: R06.00

## 2017-08-27 HISTORY — PX: AMPUTATION: SHX166

## 2017-08-27 HISTORY — DX: Cardiac arrhythmia, unspecified: I49.9

## 2017-08-27 HISTORY — DX: Gastro-esophageal reflux disease without esophagitis: K21.9

## 2017-08-27 HISTORY — DX: Acute bronchitis, unspecified: J20.9

## 2017-08-27 HISTORY — DX: Unspecified malignant neoplasm of skin, unspecified: C44.90

## 2017-08-27 LAB — BASIC METABOLIC PANEL
Anion gap: 8 (ref 5–15)
BUN: 9 mg/dL (ref 6–20)
CO2: 25 mmol/L (ref 22–32)
Calcium: 9.7 mg/dL (ref 8.9–10.3)
Chloride: 107 mmol/L (ref 101–111)
Creatinine, Ser: 0.62 mg/dL (ref 0.61–1.24)
GFR calc Af Amer: >60 mL/min (ref >60)
GFR calc non Af Amer: >60 mL/min (ref >60)
Glucose, Bld: 164 mg/dL — ABNORMAL HIGH (ref 65–99)
Potassium: 3.7 mmol/L (ref 3.5–5.1)
Sodium: 140 mmol/L (ref 135–145)

## 2017-08-27 LAB — BLOOD GAS, ARTERIAL
Acid-Base Excess: 2 mmol/L (ref 0.0–2.0)
Bicarbonate: 25.6 mmol/L (ref 20.0–28.0)
Drawn by: 470591
FIO2: 21
O2 Saturation: 94.3 %
Patient temperature: 98.6
pCO2 arterial: 36.4 mmHg (ref 32.0–48.0)
pH, Arterial: 7.461 — ABNORMAL HIGH (ref 7.350–7.450)
pO2, Arterial: 67.4 mmHg — ABNORMAL LOW (ref 83.0–108.0)

## 2017-08-27 LAB — PROTIME-INR
INR: 1.62
Prothrombin Time: 19.1 seconds — ABNORMAL HIGH (ref 11.4–15.2)

## 2017-08-27 LAB — CBC
HCT: 36.8 % — ABNORMAL LOW (ref 39.0–52.0)
Hemoglobin: 12.9 g/dL — ABNORMAL LOW (ref 13.0–17.0)
MCH: 31.6 pg (ref 26.0–34.0)
MCHC: 35.1 g/dL (ref 30.0–36.0)
MCV: 90.2 fL (ref 78.0–100.0)
Platelets: 189 10*3/uL (ref 150–400)
RBC: 4.08 MIL/uL — ABNORMAL LOW (ref 4.22–5.81)
RDW: 13.6 % (ref 11.5–15.5)
WBC: 7 10*3/uL (ref 4.0–10.5)

## 2017-08-27 LAB — APTT: aPTT: 33 seconds (ref 24–36)

## 2017-08-27 LAB — GLUCOSE, CAPILLARY
Glucose-Capillary: 138 mg/dL — ABNORMAL HIGH (ref 65–99)
Glucose-Capillary: 176 mg/dL — ABNORMAL HIGH (ref 65–99)

## 2017-08-27 SURGERY — AMPUTATION DIGIT
Anesthesia: Monitor Anesthesia Care | Site: Foot | Laterality: Left

## 2017-08-27 MED ORDER — CLINDAMYCIN HCL 300 MG PO CAPS
300.0000 mg | ORAL_CAPSULE | Freq: Three times a day (TID) | ORAL | Status: DC
  Administered 2017-08-27: 300 mg via ORAL
  Filled 2017-08-27 (×3): qty 1

## 2017-08-27 MED ORDER — HYDRALAZINE HCL 20 MG/ML IJ SOLN: 5.0000 mg | INTRAMUSCULAR | Status: DC | PRN

## 2017-08-27 MED ORDER — LEUPROLIDE ACETATE (6 MONTH) 45 MG IM KIT: 45.0000 mg | PACK | INTRAMUSCULAR | Status: DC

## 2017-08-27 MED ORDER — ONDANSETRON HCL 4 MG/2ML IJ SOLN
INTRAMUSCULAR | Status: DC | PRN
  Administered 2017-08-27: 4 mg via INTRAVENOUS

## 2017-08-27 MED ORDER — DENOSUMAB 120 MG/1.7ML ~~LOC~~ SOLN: 120.0000 mg | SUBCUTANEOUS | Status: DC

## 2017-08-27 MED ORDER — BUPIVACAINE HCL (PF) 0.25 % IJ SOLN
INTRAMUSCULAR | Status: DC | PRN
  Administered 2017-08-27: 5 mL

## 2017-08-27 MED ORDER — POTASSIUM CHLORIDE CRYS ER 20 MEQ PO TBCR: 20.0000 meq | EXTENDED_RELEASE_TABLET | Freq: Once | ORAL | Status: DC

## 2017-08-27 MED ORDER — MIDAZOLAM HCL 2 MG/2ML IJ SOLN
INTRAMUSCULAR | Status: AC
  Filled 2017-08-27: qty 2

## 2017-08-27 MED ORDER — LIDOCAINE-EPINEPHRINE (PF) 1 %-1:200000 IJ SOLN
INTRAMUSCULAR | Status: AC
  Filled 2017-08-27: qty 30

## 2017-08-27 MED ORDER — SODIUM CHLORIDE 0.9 % IV SOLN: INTRAVENOUS | Status: DC

## 2017-08-27 MED ORDER — ONDANSETRON HCL 4 MG/2ML IJ SOLN: 4.0000 mg | Freq: Once | INTRAMUSCULAR | Status: DC | PRN

## 2017-08-27 MED ORDER — SODIUM CHLORIDE 0.9% FLUSH: 3.0000 mL | INTRAVENOUS | Status: DC | PRN

## 2017-08-27 MED ORDER — FENTANYL CITRATE (PF) 100 MCG/2ML IJ SOLN
INTRAMUSCULAR | Status: DC | PRN
  Administered 2017-08-27: 50 ug via INTRAVENOUS

## 2017-08-27 MED ORDER — LOPERAMIDE HCL 2 MG PO CAPS: 2.0000 mg | ORAL_CAPSULE | ORAL | Status: DC | PRN

## 2017-08-27 MED ORDER — ABIRATERONE ACETATE 250 MG PO TABS
1000.0000 mg | ORAL_TABLET | Freq: Every day | ORAL | Status: DC
  Administered 2017-08-28: 1000 mg via ORAL
  Filled 2017-08-27: qty 4

## 2017-08-27 MED ORDER — BUPIVACAINE HCL (PF) 0.25 % IJ SOLN
INTRAMUSCULAR | Status: AC
  Filled 2017-08-27: qty 10

## 2017-08-27 MED ORDER — LIDOCAINE HCL (PF) 1 % IJ SOLN
INTRAMUSCULAR | Status: DC | PRN
  Administered 2017-08-27: 5 mL

## 2017-08-27 MED ORDER — NYSTATIN 100000 UNIT/GM EX POWD: 1.0000 g | Freq: Four times a day (QID) | CUTANEOUS | Status: DC | PRN

## 2017-08-27 MED ORDER — FENTANYL CITRATE (PF) 100 MCG/2ML IJ SOLN
INTRAMUSCULAR | Status: AC
  Filled 2017-08-27: qty 2

## 2017-08-27 MED ORDER — LISINOPRIL 20 MG PO TABS
20.0000 mg | ORAL_TABLET | Freq: Every day | ORAL | Status: DC
  Filled 2017-08-27: qty 1

## 2017-08-27 MED ORDER — ONDANSETRON HCL 4 MG/2ML IJ SOLN
INTRAMUSCULAR | Status: AC
  Filled 2017-08-27: qty 2

## 2017-08-27 MED ORDER — LACTATED RINGERS IV SOLN
INTRAVENOUS | Status: DC
  Administered 2017-08-27: 11:00:00 via INTRAVENOUS

## 2017-08-27 MED ORDER — GLIPIZIDE ER 5 MG PO TB24
5.0000 mg | ORAL_TABLET | Freq: Every day | ORAL | Status: DC
  Administered 2017-08-28: 5 mg via ORAL
  Filled 2017-08-27: qty 1

## 2017-08-27 MED ORDER — LISINOPRIL 10 MG PO TABS
20.0000 mg | ORAL_TABLET | Freq: Every day | ORAL | Status: DC
  Administered 2017-08-27: 20 mg via ORAL
  Filled 2017-08-27: qty 2

## 2017-08-27 MED ORDER — SENNOSIDES-DOCUSATE SODIUM 8.6-50 MG PO TABS: 1.0000 | ORAL_TABLET | Freq: Every day | ORAL | Status: DC | PRN

## 2017-08-27 MED ORDER — CHLORHEXIDINE GLUCONATE 4 % EX LIQD: 60.0000 mL | Freq: Once | CUTANEOUS | Status: DC

## 2017-08-27 MED ORDER — METOPROLOL TARTRATE 5 MG/5ML IV SOLN: 2.0000 mg | INTRAVENOUS | Status: DC | PRN

## 2017-08-27 MED ORDER — PROPOFOL 500 MG/50ML IV EMUL
INTRAVENOUS | Status: DC | PRN
  Administered 2017-08-27: 100 ug/kg/min via INTRAVENOUS

## 2017-08-27 MED ORDER — PHENYLEPH-SHARK LIV OIL-MO-PET 0.25-3-14-71.9 % RE OINT: 1.0000 "application " | TOPICAL_OINTMENT | Freq: Two times a day (BID) | RECTAL | Status: DC | PRN

## 2017-08-27 MED ORDER — PHENYLEPHRINE 40 MCG/ML (10ML) SYRINGE FOR IV PUSH (FOR BLOOD PRESSURE SUPPORT)
PREFILLED_SYRINGE | INTRAVENOUS | Status: DC | PRN
  Administered 2017-08-27: 80 ug via INTRAVENOUS

## 2017-08-27 MED ORDER — DOCUSATE SODIUM 100 MG PO CAPS
100.0000 mg | ORAL_CAPSULE | Freq: Two times a day (BID) | ORAL | Status: DC
Start: 2017-08-27 — End: 2017-08-28
  Administered 2017-08-27: 100 mg via ORAL
  Filled 2017-08-27: qty 1

## 2017-08-27 MED ORDER — ACETAMINOPHEN 500 MG PO TABS: 500.0000 mg | ORAL_TABLET | Freq: Every day | ORAL | Status: DC | PRN

## 2017-08-27 MED ORDER — LIDOCAINE HCL (PF) 1 % IJ SOLN
INTRAMUSCULAR | Status: AC
  Filled 2017-08-27: qty 30

## 2017-08-27 MED ORDER — FENTANYL CITRATE (PF) 250 MCG/5ML IJ SOLN
INTRAMUSCULAR | Status: AC
  Filled 2017-08-27: qty 5

## 2017-08-27 MED ORDER — POTASSIUM CHLORIDE CRYS ER 20 MEQ PO TBCR: 20.0000 meq | EXTENDED_RELEASE_TABLET | Freq: Every day | ORAL | Status: DC

## 2017-08-27 MED ORDER — PROPOFOL 10 MG/ML IV BOLUS
INTRAVENOUS | Status: DC | PRN
  Administered 2017-08-27: 10 mg via INTRAVENOUS

## 2017-08-27 MED ORDER — DOCUSATE SODIUM 100 MG PO CAPS: 100.0000 mg | ORAL_CAPSULE | Freq: Every day | ORAL | Status: DC | PRN

## 2017-08-27 MED ORDER — MORPHINE SULFATE (PF) 2 MG/ML IV SOLN: 2.0000 mg | INTRAVENOUS | Status: DC | PRN

## 2017-08-27 MED ORDER — ACETAMINOPHEN 325 MG PO TABS
325.0000 mg | ORAL_TABLET | ORAL | Status: DC | PRN
  Administered 2017-08-27 – 2017-08-28 (×2): 650 mg via ORAL
  Filled 2017-08-27 (×2): qty 2

## 2017-08-27 MED ORDER — ACETAMINOPHEN 10 MG/ML IV SOLN: 1000.0000 mg | Freq: Once | INTRAVENOUS | Status: DC | PRN

## 2017-08-27 MED ORDER — AMITRIPTYLINE HCL 25 MG PO TABS
25.0000 mg | ORAL_TABLET | Freq: Every day | ORAL | Status: DC
  Administered 2017-08-27: 25 mg via ORAL
  Filled 2017-08-27: qty 1

## 2017-08-27 MED ORDER — WARFARIN SODIUM 2.5 MG PO TABS
2.5000 mg | ORAL_TABLET | ORAL | Status: DC
  Administered 2017-08-27: 2.5 mg via ORAL
  Filled 2017-08-27 (×2): qty 1

## 2017-08-27 MED ORDER — CYANOCOBALAMIN 1000 MCG/ML IJ SOLN: 1000.0000 ug | INTRAMUSCULAR | Status: DC

## 2017-08-27 MED ORDER — ALBUTEROL SULFATE HFA 108 (90 BASE) MCG/ACT IN AERS: 1.0000 | INHALATION_SPRAY | Freq: Four times a day (QID) | RESPIRATORY_TRACT | Status: DC | PRN

## 2017-08-27 MED ORDER — ACETAMINOPHEN 325 MG RE SUPP: 325.0000 mg | RECTAL | Status: DC | PRN

## 2017-08-27 MED ORDER — SODIUM CHLORIDE 0.9 % IV SOLN: 250.0000 mL | INTRAVENOUS | Status: DC | PRN

## 2017-08-27 MED ORDER — LABETALOL HCL 5 MG/ML IV SOLN: 10.0000 mg | INTRAVENOUS | Status: DC | PRN

## 2017-08-27 MED ORDER — DIPHENHYDRAMINE HCL 25 MG PO TABS
25.0000 mg | ORAL_TABLET | Freq: Four times a day (QID) | ORAL | Status: DC | PRN
  Filled 2017-08-27: qty 1

## 2017-08-27 MED ORDER — OXYCODONE HCL 5 MG PO TABS: 5.0000 mg | ORAL_TABLET | ORAL | Status: DC | PRN

## 2017-08-27 MED ORDER — DILTIAZEM HCL 90 MG PO TABS
90.0000 mg | ORAL_TABLET | Freq: Two times a day (BID) | ORAL | Status: DC
  Administered 2017-08-27: 90 mg via ORAL
  Filled 2017-08-27 (×2): qty 1

## 2017-08-27 MED ORDER — PREDNISONE 2.5 MG PO TABS
2.5000 mg | ORAL_TABLET | Freq: Two times a day (BID) | ORAL | Status: DC
  Administered 2017-08-28: 2.5 mg via ORAL
  Filled 2017-08-27 (×2): qty 1

## 2017-08-27 MED ORDER — WARFARIN SODIUM 5 MG PO TABS: 5.0000 mg | ORAL_TABLET | ORAL | Status: DC

## 2017-08-27 MED ORDER — ALUM & MAG HYDROXIDE-SIMETH 200-200-20 MG/5ML PO SUSP: 15.0000 mL | ORAL | Status: DC | PRN

## 2017-08-27 MED ORDER — 0.9 % SODIUM CHLORIDE (POUR BTL) OPTIME
TOPICAL | Status: DC | PRN
  Administered 2017-08-27: 1000 mL

## 2017-08-27 MED ORDER — ALBUTEROL SULFATE (2.5 MG/3ML) 0.083% IN NEBU: 2.5000 mg | INHALATION_SOLUTION | Freq: Four times a day (QID) | RESPIRATORY_TRACT | Status: DC | PRN

## 2017-08-27 MED ORDER — FUROSEMIDE 20 MG PO TABS: 20.0000 mg | ORAL_TABLET | Freq: Every day | ORAL | Status: DC

## 2017-08-27 MED ORDER — ONDANSETRON HCL 4 MG/2ML IJ SOLN: 4.0000 mg | Freq: Four times a day (QID) | INTRAMUSCULAR | Status: DC | PRN

## 2017-08-27 MED ORDER — GUAIFENESIN-DM 100-10 MG/5ML PO SYRP: 15.0000 mL | ORAL_SOLUTION | ORAL | Status: DC | PRN

## 2017-08-27 MED ORDER — ENOXAPARIN SODIUM 40 MG/0.4ML ~~LOC~~ SOLN: 40.0000 mg | Freq: Every day | SUBCUTANEOUS | Status: DC

## 2017-08-27 MED ORDER — PANTOPRAZOLE SODIUM 40 MG PO TBEC: 40.0000 mg | DELAYED_RELEASE_TABLET | Freq: Every day | ORAL | Status: DC

## 2017-08-27 MED ORDER — VITAMIN D 1000 UNITS PO TABS: 1000.0000 [IU] | ORAL_TABLET | Freq: Every day | ORAL | Status: DC

## 2017-08-27 MED ORDER — METFORMIN HCL 850 MG PO TABS
850.0000 mg | ORAL_TABLET | Freq: Two times a day (BID) | ORAL | Status: DC
  Administered 2017-08-28: 850 mg via ORAL
  Filled 2017-08-27 (×2): qty 1

## 2017-08-27 MED ORDER — WARFARIN - PHYSICIAN DOSING INPATIENT: Freq: Every day | Status: DC

## 2017-08-27 MED ORDER — SENNA-DOCUSATE SODIUM 8.6-50 MG PO TABS: 1.0000 | ORAL_TABLET | Freq: Every day | ORAL | Status: DC | PRN

## 2017-08-27 MED ORDER — FENTANYL CITRATE (PF) 100 MCG/2ML IJ SOLN: 25.0000 ug | INTRAMUSCULAR | Status: DC | PRN

## 2017-08-27 MED ORDER — WARFARIN SODIUM 2.5 MG PO TABS: 2.5000 mg | ORAL_TABLET | ORAL | Status: DC

## 2017-08-27 MED ORDER — SODIUM CHLORIDE 0.9% FLUSH: 3.0000 mL | Freq: Two times a day (BID) | INTRAVENOUS | Status: DC

## 2017-08-27 MED ORDER — PHENOL 1.4 % MT LIQD: 1.0000 | OROMUCOSAL | Status: DC | PRN

## 2017-08-27 SURGICAL SUPPLY — 33 items
BANDAGE ACE 4X5 VEL STRL LF (GAUZE/BANDAGES/DRESSINGS) ×2 IMPLANT
BNDG GAUZE ELAST 4 BULKY (GAUZE/BANDAGES/DRESSINGS) ×2 IMPLANT
CANISTER SUCT 3000ML PPV (MISCELLANEOUS) ×2 IMPLANT
COVER SURGICAL LIGHT HANDLE (MISCELLANEOUS) ×2 IMPLANT
DRAPE EXTREMITY T 121X128X90 (DRAPE) ×2 IMPLANT
DRAPE HALF SHEET 40X57 (DRAPES) ×2 IMPLANT
DRSG EMULSION OIL 3X3 NADH (GAUZE/BANDAGES/DRESSINGS) ×2 IMPLANT
ELECT REM PT RETURN 9FT ADLT (ELECTROSURGICAL) ×2
ELECTRODE REM PT RTRN 9FT ADLT (ELECTROSURGICAL) ×1 IMPLANT
GAUZE SPONGE 4X4 12PLY STRL (GAUZE/BANDAGES/DRESSINGS) ×2 IMPLANT
GAUZE SPONGE 4X4 12PLY STRL LF (GAUZE/BANDAGES/DRESSINGS) ×1 IMPLANT
GLOVE BIO SURGEON STRL SZ7.5 (GLOVE) ×2 IMPLANT
GLOVE BIOGEL PI IND STRL 6.5 (GLOVE) IMPLANT
GLOVE BIOGEL PI INDICATOR 6.5 (GLOVE) ×2
GOWN STRL NON-REIN LRG LVL3 (GOWN DISPOSABLE) ×1 IMPLANT
GOWN STRL REUS W/ TWL LRG LVL3 (GOWN DISPOSABLE) ×3 IMPLANT
GOWN STRL REUS W/TWL LRG LVL3 (GOWN DISPOSABLE) ×6
KIT BASIN OR (CUSTOM PROCEDURE TRAY) ×2 IMPLANT
KIT TURNOVER KIT B (KITS) ×2 IMPLANT
NDL HYPO 25GX1X1/2 BEV (NEEDLE) IMPLANT
NEEDLE HYPO 25GX1X1/2 BEV (NEEDLE) ×2 IMPLANT
NS IRRIG 1000ML POUR BTL (IV SOLUTION) ×2 IMPLANT
PACK GENERAL/GYN (CUSTOM PROCEDURE TRAY) ×2 IMPLANT
PAD ARMBOARD 7.5X6 YLW CONV (MISCELLANEOUS) ×4 IMPLANT
SPECIMEN JAR SMALL (MISCELLANEOUS) ×2 IMPLANT
SUT ETHILON 3 0 PS 1 (SUTURE) ×3 IMPLANT
SUT VIC AB 3-0 SH 27 (SUTURE)
SUT VIC AB 3-0 SH 27X BRD (SUTURE) IMPLANT
SYR CONTROL 10ML LL (SYRINGE) ×1 IMPLANT
TOWEL GREEN STERILE (TOWEL DISPOSABLE) ×4 IMPLANT
TOWEL GREEN STERILE FF (TOWEL DISPOSABLE) ×2 IMPLANT
UNDERPAD 30X30 (UNDERPADS AND DIAPERS) ×2 IMPLANT
WATER STERILE IRR 1000ML POUR (IV SOLUTION) ×2 IMPLANT

## 2017-08-27 NOTE — Transfer of Care (Signed)
Immediate Anesthesia Transfer of Care Note  Patient: [name]  Procedure(s) Performed: AMPUTATION Left SECOND TOE (Left Foot)  Patient Location: PACU  Anesthesia Type:MAC  Level of Consciousness: awake, alert , oriented and patient cooperative  Airway & Oxygen Therapy: Patient Spontanous Breathing  Post-op Assessment: Report given to RN, Post -op Vital signs reviewed and stable and Patient moving all extremities X 4  Post vital signs: Reviewed and stable  Last Vitals:  Vitals Value Taken Time  BP 157/101 08/27/2017  1:21 PM  Temp    Pulse 83 08/27/2017  1:22 PM  Resp 17 08/27/2017  1:22 PM  SpO2 98 % 08/27/2017  1:22 PM  Vitals shown include unvalidated device data.  Last Pain:  Vitals:   08/27/17 1321  TempSrc:   PainSc: (P) 0-No pain      Patients Stated Pain Goal: 4 (70/96/43 8381)  Complications: No apparent anesthesia complications

## 2017-08-27 NOTE — Telephone Encounter (Signed)
Sched appt 09/23/17 at 3:30. Spoke to pts daughter [name] to inform them of appt.

## 2017-08-27 NOTE — Telephone Encounter (Signed)
-----   Message from [name], MD sent at 08/27/2017 12:53 PM EDT -----  Amputation left second toe with resection of metatarsal head   Follow up with me in 4 weeks for suture removal  [name]

## 2017-08-27 NOTE — Telephone Encounter (Signed)
Per Dr. Oneida Alar, I called and spoke to [facility], nurse with [facility], for daily wet-to-dry dressing changes, s/p right toe amputation.  [name] was also advised to wash the foot with soap and water, dry, and teach patient to change his dressings.  [phone].   Thurston Hole., LPN

## 2017-08-27 NOTE — Anesthesia Postprocedure Evaluation (Signed)
Anesthesia Post Note  Patient: [name]  Procedure(s) Performed: AMPUTATION Left SECOND TOE (Left Foot)     Patient location during evaluation: PACU Anesthesia Type: MAC Level of consciousness: awake and alert Pain management: pain level controlled Vital Signs Assessment: post-procedure vital signs reviewed and stable Respiratory status: spontaneous breathing, nonlabored ventilation, respiratory function stable and patient connected to nasal cannula oxygen Cardiovascular status: stable and blood pressure returned to baseline Postop Assessment: no apparent nausea or vomiting Anesthetic complications: no    Last Vitals:  Vitals:   08/27/17 1336 08/27/17 1343  BP: (!) 158/94 (!) 169/91  Pulse: 72 73  Resp: 18 17  Temp:  (!) 36.1 C  SpO2: 96% 96%    Last Pain:  Vitals:   08/27/17 1343  TempSrc:   PainSc: 0-No pain    LLE Motor Response: Purposeful movement (08/27/17 1343) LLE Sensation: Full sensation (08/27/17 1343) RLE Motor Response: Purposeful movement (08/27/17 1343) RLE Sensation: Full sensation (08/27/17 1343)      Barnet Glasgow

## 2017-08-27 NOTE — Op Note (Signed)
Procedure: Amputation left second toe with resection of metatarsal head  Preoperative diagnosis: Osteomyelitis left second toe  Postoperative diagnosis: Same  Anesthesia: Local with MAC.  Specimens: left second toe  Assistant: Nurse  Operative details: After obtaining informed consent, the patient was taken to the operating room. The patient was placed in supine position on the operating room table. The foot was prepped and draped in sterile fashion.  Local anesthesia mixture of xylocaine and marcaine was infiltrated as a digital block. A circumferential incision was made at the base of the left second toe. The incision was carried all the way down to the level of the bone. There was some skin edge bleeding.  The bone was transected with a bone cutter in the midportion of the proximal phalanx. The remainder of the proximal phalanx was debrided away with rongeurs. The metatarsal head was removed with rongeurs. The wound was thoroughly irrigated with normal saline solution. Hemostasis was obtained. Skin edges were reapproximated using interrupted 3-0 vertical mattress and simple nylon sutures. A dry sterile dressing was applied. The patient tolerated procedure well and there were no complications. Instrument sponge and needle counts were correct at the end of the case. The patient was taken to recovery in stable condition.   Ruta Hinds, MD  [facility] [place]  Office: [phone]  Pager: [phone]

## 2017-08-27 NOTE — Interval H&P Note (Signed)
History and Physical Interval Note:  08/27/2017 10:14 AM  [name]  has presented today for surgery, with the diagnosis of nonviable tissue right second toe  The various methods of treatment have been discussed with the patient and family. After consideration of risks, benefits and other options for treatment, the patient has consented to  Procedure(s): AMPUTATION RIGHT SECOND TOE (Right) as a surgical intervention .  The patient's history has been reviewed, patient examined, no change in status, stable for surgery.  I have reviewed the patient's chart and labs.  Questions were answered to the patient's satisfaction.     Ruta Hinds

## 2017-08-27 NOTE — Progress Notes (Signed)
Preop note and history were incorrectly labeled.  Problems have all been with left foot and left toes  Ruta Hinds, MD [facility] [place] Office: [phone] Pager: [phone]

## 2017-08-27 NOTE — Progress Notes (Signed)
Pt voided 125 mL post 385 mL bladder scan. Pt stood shakily to pee, HR increased to 130s. Pt states he "feels better" and would like to continue to stand on his good leg to pee. Will continue to monitor.

## 2017-08-27 NOTE — Plan of Care (Signed)
  Problem: Clinical Measurements: Goal: Ability to maintain clinical measurements within normal limits will improve Outcome: Progressing Goal: Postoperative complications will be avoided or minimized Outcome: Progressing   Problem: Health Behavior/Discharge Planning: Goal: Ability to manage health-related needs will improve Outcome: Progressing   Problem: Coping: Goal: Level of anxiety will decrease Outcome: Progressing

## 2017-08-27 NOTE — Anesthesia Preprocedure Evaluation (Addendum)
Anesthesia Evaluation  Patient identified by MRN, date of birth, ID band Patient awake    Reviewed: Allergy & Precautions, NPO status , Patient's Chart, lab work & pertinent test results  Airway Mallampati: II  TM Distance: >3 FB Neck ROM: Full    Dental no notable dental hx.    Pulmonary neg pulmonary ROS,    Pulmonary exam normal breath sounds clear to auscultation       Cardiovascular hypertension, Normal cardiovascular exam Rhythm:Regular Rate:Normal  Echo 10/05/16 - Left ventricle: The cavity size was normal. Wall thickness was   normal. Systolic function was normal. The estimated ejection   fraction was in the range of 60% to 65%. - Left atrium: The atrium was mildly dilated. - Right atrium: The atrium was mildly dilated.    Neuro/Psych negative neurological ROS  negative psych ROS   GI/Hepatic Neg liver ROS,   Endo/Other  diabetes  Renal/GU negative Renal ROS     Musculoskeletal   Abdominal   Peds  Hematology negative hematology ROS (+)   Anesthesia Other Findings   Reproductive/Obstetrics negative OB ROS                            Lab Results  Component Value Date   CREATININE 0.73 08/09/2017   BUN 16 08/09/2017   NA 136 08/09/2017   K 3.9 08/09/2017   CL 98 (L) 08/09/2017   CO2 27 08/09/2017     Lab Results  Component Value Date   WBC 7.6 08/09/2017   HGB 14.6 08/09/2017   HCT 43.1 08/09/2017   MCV 91.9 08/09/2017   PLT 167 08/09/2017    Anesthesia Physical Anesthesia Plan  ASA: III  Anesthesia Plan: MAC   Post-op Pain Management:    Induction: Intravenous  PONV Risk Score and Plan: Treatment may vary due to age or medical condition  Airway Management Planned: Mask, Natural Airway and Nasal Cannula  Additional Equipment:   Intra-op Plan:   Post-operative Plan:   Informed Consent: I have reviewed the patients History and Physical, chart, labs and  discussed the procedure including the risks, benefits and alternatives for the proposed anesthesia with the patient or authorized representative who has indicated his/her understanding and acceptance.     Plan Discussed with: CRNA  Anesthesia Plan Comments:        Anesthesia Quick Evaluation

## 2017-08-28 ENCOUNTER — Encounter (HOSPITAL_COMMUNITY): Payer: Self-pay | Admitting: Vascular Surgery

## 2017-08-28 DIAGNOSIS — R791 Abnormal coagulation profile: Secondary | ICD-10-CM | POA: Diagnosis not present

## 2017-08-28 DIAGNOSIS — I10 Essential (primary) hypertension: Secondary | ICD-10-CM | POA: Diagnosis not present

## 2017-08-28 DIAGNOSIS — Z89412 Acquired absence of left great toe: Secondary | ICD-10-CM | POA: Diagnosis not present

## 2017-08-28 DIAGNOSIS — M868X7 Other osteomyelitis, ankle and foot: Secondary | ICD-10-CM | POA: Diagnosis not present

## 2017-08-28 DIAGNOSIS — L03032 Cellulitis of left toe: Secondary | ICD-10-CM | POA: Diagnosis not present

## 2017-08-28 DIAGNOSIS — E1169 Type 2 diabetes mellitus with other specified complication: Secondary | ICD-10-CM | POA: Diagnosis not present

## 2017-08-28 LAB — PROTIME-INR
INR: 1.61
Prothrombin Time: 19 seconds — ABNORMAL HIGH (ref 11.4–15.2)

## 2017-08-28 MED ORDER — OXYCODONE HCL 5 MG PO TABS: 5.0000 mg | ORAL_TABLET | Freq: Four times a day (QID) | ORAL | 0 refills | Status: DC | PRN

## 2017-08-28 NOTE — Progress Notes (Signed)
Patient is active with [facility] as prior to admission; [name] with [facility] called and is aware of discharge and pt is to be seen for [facility] services in the am; Aneta Mins [phone]

## 2017-08-28 NOTE — Progress Notes (Addendum)
  Progress Note    08/28/2017 8:39 AM 1 Day Post-Op  Subjective:  Minimal pain overnight   Vitals:   08/28/17 0026 08/28/17 0542  BP: 138/68 (!) 145/83  Pulse: 85 67  Resp: 20 (!) 22  Temp:  97.6 F (36.4 C)  SpO2: 91% 97%    Physical Exam: Incisions:  Sanguinous saturation of dressing; no active bleeding, sutures approximating skin edges well; ATA and PTA by doppler RLE   CBC    Component Value Date/Time   WBC 7.0 08/27/2017 0953   RBC 4.08 (L) 08/27/2017 0953   HGB 12.9 (L) 08/27/2017 0953   HCT 36.8 (L) 08/27/2017 0953   PLT 189 08/27/2017 0953   MCV 90.2 08/27/2017 0953   MCH 31.6 08/27/2017 0953   MCHC 35.1 08/27/2017 0953   RDW 13.6 08/27/2017 0953   LYMPHSABS 1.9 08/09/2017 1330   MONOABS 1.0 08/09/2017 1330   EOSABS 0.0 08/09/2017 1330   BASOSABS 0.0 08/09/2017 1330    BMET    Component Value Date/Time   NA 140 08/27/2017 0953   K 3.7 08/27/2017 0953   CL 107 08/27/2017 0953   CO2 25 08/27/2017 0953   GLUCOSE 164 (H) 08/27/2017 0953   BUN 9 08/27/2017 0953   CREATININE 0.62 08/27/2017 0953   CREATININE 1.14 07/27/2013 1004   CALCIUM 9.7 08/27/2017 0953   GFRNONAA >60 08/27/2017 0953   GFRNONAA 58 (L) 07/27/2013 1004   GFRAA >60 08/27/2017 0953   GFRAA 66 07/27/2013 1004    INR    Component Value Date/Time   INR 1.61 08/28/2017 0324   INR 2.0 04/07/2010 0821     Intake/Output Summary (Last 24 hours) at 08/28/2017 0839 Last data filed at 08/28/2017 0512 Gross per 24 hour  Intake 500 ml  Output 583 ml  Net -83 ml     Assessment/Plan:  84 y.o. male is s/p right 2nd toe amputation  1 Day Post-Op  - Resume home dose of coumadin - Home health nurse arranged for tomorrow - D/c today; f/u in 4 weeks with Dr. Oneida Alar in office - heel weight bearing only R foot   Dagoberto Ligas, PA-C [facility] [phone] 08/28/2017 8:39 AM   I have independently interviewed and examined patient and agree with PA assessment and  plan above.   Jimmi Sidener C. Donzetta Matters, MD [facility] [place] Office: [phone] Pager: [phone]

## 2017-08-28 NOTE — Discharge Summary (Signed)
Physician Discharge Summary   Patient ID: [name] [id number] 84 y.o. 1923-05-27  Admit date: 08/27/2017 r Discharge date and time: 08/28/17   Admitting Physician: [name] Dutch, MD   Discharge Physician: Dr. Donzetta Foster  Admission Diagnoses: nonviable tissue right second toe  Discharge Diagnoses: s/p L 2nd toe amp  Admission Condition: fair  Discharged Condition: fair  Indication for Admission: osteomyelitis L 2nd toe  Hospital Course: [name] is a 84 year old male who underwent successful left great toe amputation in March 2019 that has since healed.  He however has developed an ulceration with a sending erythema from his left second toe.  He came in as an outpatient yesterday on 08/27/2017 for left second toe amputation with resection of metatarsal head by Dr. Oneida Foster due to osteomyelitis.  He tolerated the procedure well and was admitted overnight for observation.  POD #1 left foot dressing was saturated with sanguinous drainage however no active bleeding was noted.  He will be asked to resume his home dose of Coumadin today.  Home health nurse has been arranged for wound care and she will report tomorrow.  He will follow-up in office in about 4 weeks for wound check.  1 to 2 days of narcotic pain medication was prescribed for continued postoperative pain control.  Discharge instructions were reviewed with the patient, his wife, and their daughter and they all voiced their understanding.  He will be discharged this morning in stable condition.  Consults: None  Treatments: surgery: L 2nd toe amp by Dr. Oneida Foster 08/27/17  Discharge Exam: See progress note 08/28/2017 Vitals:   08/28/17 0026 08/28/17 0542  BP: 138/68 (!) 145/83  Pulse: 85 67  Resp: 20 (!) 22  Temp:  97.6 F (36.4 C)  SpO2: 91% 97%     Disposition: Discharge disposition: 01-Home or Self Care       Patient Instructions:  Allergies as of 08/28/2017      Reactions   Augmentin [amoxicillin-pot Clavulanate]  Rash   Redness of skin      Medication List    TAKE these medications   abiraterone acetate 250 MG tablet Commonly known as:  ZYTIGA Take 4 tablets (1,000 mg total) by mouth daily. Take on an empty stomach 1 hour before or 2 hours after a meal   acetaminophen 500 MG tablet Commonly known as:  TYLENOL Take 500-1,000 mg by mouth daily as needed for mild pain, fever or headache.   amitriptyline 25 MG tablet Commonly known as:  ELAVIL Take 25 mg by mouth at bedtime.   CALCIUM CARB-CHOLECALCIFEROL PO Take 1 tablet by mouth daily.   clindamycin 300 MG capsule Commonly known as:  CLEOCIN Take 300 mg by mouth 3 (three) times daily. For 7 days, started 08/23/2017   clotrimazole-betamethasone cream Commonly known as:  LOTRISONE Apply 1 application topically daily as needed (for fungus).   CRANBERRY PO Take 4,200 mg by mouth daily.   cyanocobalamin 1000 MCG/ML injection Commonly known as:  (VITAMIN B-12) Inject 1,000 mcg every 30 (thirty) days into the skin.   denosumab 120 MG/1.7ML Soln injection Commonly known as:  XGEVA Inject 120 mg into the skin every 30 (thirty) days.   diltiazem 90 MG tablet Commonly known as:  CARDIZEM TAKE 90 MG BY MOUTH TWICE DAILY   diphenhydrAMINE 25 MG tablet Commonly known as:  BENADRYL Take 25 mg by mouth every 6 (six) hours as needed for itching.   docusate sodium 100 MG capsule Commonly known as:  COLACE Take 100  mg by mouth daily as needed for mild constipation.   furosemide 20 MG tablet Commonly known as:  LASIX Take 1 tablet (20 mg total) by mouth daily.   glipiZIDE 5 MG 24 hr tablet Commonly known as:  GLUCOTROL XL Take 5 mg by mouth daily with breakfast.   guaiFENesin 600 MG 12 hr tablet Commonly known as:  MUCINEX Take 1 tablet (600 mg total) by mouth 2 (two) times daily. What changed:    when to take this  reasons to take this   lisinopril 20 MG tablet Commonly known as:  PRINIVIL,ZESTRIL Take 1 tablet (20 mg total)  daily by mouth.   loperamide 2 MG capsule Commonly known as:  IMODIUM Take 2 mg by mouth as needed for diarrhea or loose stools.   LUPRON DEPOT (8-MONTH) 45 MG injection Generic drug:  Leuprolide Acetate (6 Month) Inject 45 mg into the muscle every 6 (six) months.   metFORMIN 850 MG tablet Commonly known as:  GLUCOPHAGE Take 850 mg by mouth 2 (two) times daily with a meal.   nystatin powder Commonly known as:  MYCOSTATIN/NYSTOP Apply 1 g topically 4 (four) times daily as needed (fungus).   omeprazole 20 MG capsule Commonly known as:  PRILOSEC Take 20 mg by mouth daily.   oxyCODONE 5 MG immediate release tablet Commonly known as:  Oxy IR/ROXICODONE Take 1-2 tablets (5-10 mg total) by mouth every 6 (six) hours as needed for moderate pain.   phenylephrine-shark liver oil-mineral oil-petrolatum 0.25-3-14-71.9 % rectal ointment Commonly known as:  PREPARATION H Place 1 application rectally 2 (two) times daily as needed (for chaffing).   potassium chloride 10 MEQ tablet Commonly known as:  K-DUR,KLOR-CON Take 2 tablets (20 mEq total) by mouth daily.   predniSONE 5 MG tablet Commonly known as:  DELTASONE Take 1 tablet (5 mg total) by mouth 2 (two) times daily with a meal. What changed:  how much to take   sennosides-docusate sodium 8.6-50 MG tablet Commonly known as:  SENOKOT-S Take 1 tablet by mouth daily as needed for constipation.   VENTOLIN HFA 108 (90 Base) MCG/ACT inhaler Generic drug:  albuterol Inhale 1-2 puffs into the lungs every 6 (six) hours as needed for wheezing or shortness of breath.   Vitamin D3 1000 units Caps Take 1,000 Units by mouth daily.   warfarin 2.5 MG tablet Commonly known as:  COUMADIN Take as directed. If you are unsure how to take this medication, talk to your nurse or doctor. Original instructions:  Take 2 tablets daily except 1 tablet on Tuesdays and Fridays or as directed What changed:    how much to take  how to take this  when to  take this  additional instructions      Activity: activity as tolerated Diet: regular diet Wound Care: Home health nurse has been arranged for wound care  Follow-up with Dr. Oneida Foster in 4 weeks.  SignedDagoberto Foster 08/28/2017 9:17 AM

## 2017-08-28 NOTE — Progress Notes (Signed)
Discharged to home with family office visits in place teaching done  

## 2017-08-29 DIAGNOSIS — F419 Anxiety disorder, unspecified: Secondary | ICD-10-CM | POA: Diagnosis not present

## 2017-08-29 DIAGNOSIS — Z4781 Encounter for orthopedic aftercare following surgical amputation: Secondary | ICD-10-CM | POA: Diagnosis not present

## 2017-08-29 DIAGNOSIS — E119 Type 2 diabetes mellitus without complications: Secondary | ICD-10-CM | POA: Diagnosis not present

## 2017-08-29 DIAGNOSIS — D519 Vitamin B12 deficiency anemia, unspecified: Secondary | ICD-10-CM | POA: Diagnosis not present

## 2017-08-29 DIAGNOSIS — Z89411 Acquired absence of right great toe: Secondary | ICD-10-CM | POA: Diagnosis not present

## 2017-08-29 DIAGNOSIS — I482 Chronic atrial fibrillation: Secondary | ICD-10-CM | POA: Diagnosis not present

## 2017-08-30 ENCOUNTER — Telehealth: Payer: Self-pay | Admitting: Vascular Surgery

## 2017-08-30 DIAGNOSIS — I482 Chronic atrial fibrillation: Secondary | ICD-10-CM | POA: Diagnosis not present

## 2017-08-30 DIAGNOSIS — I70203 Unspecified atherosclerosis of native arteries of extremities, bilateral legs: Secondary | ICD-10-CM | POA: Diagnosis not present

## 2017-08-30 DIAGNOSIS — E1151 Type 2 diabetes mellitus with diabetic peripheral angiopathy without gangrene: Secondary | ICD-10-CM | POA: Diagnosis not present

## 2017-08-30 DIAGNOSIS — D519 Vitamin B12 deficiency anemia, unspecified: Secondary | ICD-10-CM | POA: Diagnosis not present

## 2017-08-30 DIAGNOSIS — Z4781 Encounter for orthopedic aftercare following surgical amputation: Secondary | ICD-10-CM | POA: Diagnosis not present

## 2017-08-30 DIAGNOSIS — M86171 Other acute osteomyelitis, right ankle and foot: Secondary | ICD-10-CM | POA: Diagnosis not present

## 2017-08-30 LAB — GLUCOSE, CAPILLARY: Glucose-Capillary: 231 mg/dL — ABNORMAL HIGH (ref 65–99)

## 2017-08-30 NOTE — Telephone Encounter (Signed)
-----   Message from Penni Homans, RN sent at 08/30/2017  9:29 AM EDT ----- Regarding: Appointment   ----- Message ----- From: Iline Oven Sent: 08/28/2017   8:43 AM To: [facility] Charge Pool  Can you schedule an appt for this pt in about 4 weeks with Dr. Oneida Alar.  PO L 2nd toe amp Thanks, [facility]

## 2017-08-30 NOTE — Telephone Encounter (Signed)
sch appt 10/07/17 1130am s/p L 2nd toe amp

## 2017-08-31 ENCOUNTER — Ambulatory Visit (INDEPENDENT_AMBULATORY_CARE_PROVIDER_SITE_OTHER): Payer: Medicare Other | Admitting: *Deleted

## 2017-08-31 DIAGNOSIS — I482 Chronic atrial fibrillation: Secondary | ICD-10-CM | POA: Diagnosis not present

## 2017-08-31 DIAGNOSIS — Z5181 Encounter for therapeutic drug level monitoring: Secondary | ICD-10-CM | POA: Diagnosis not present

## 2017-08-31 DIAGNOSIS — Z4781 Encounter for orthopedic aftercare following surgical amputation: Secondary | ICD-10-CM | POA: Diagnosis not present

## 2017-08-31 DIAGNOSIS — I70203 Unspecified atherosclerosis of native arteries of extremities, bilateral legs: Secondary | ICD-10-CM | POA: Diagnosis not present

## 2017-08-31 DIAGNOSIS — I4891 Unspecified atrial fibrillation: Secondary | ICD-10-CM

## 2017-08-31 DIAGNOSIS — D519 Vitamin B12 deficiency anemia, unspecified: Secondary | ICD-10-CM | POA: Diagnosis not present

## 2017-08-31 DIAGNOSIS — E1151 Type 2 diabetes mellitus with diabetic peripheral angiopathy without gangrene: Secondary | ICD-10-CM | POA: Diagnosis not present

## 2017-08-31 DIAGNOSIS — M86171 Other acute osteomyelitis, right ankle and foot: Secondary | ICD-10-CM | POA: Diagnosis not present

## 2017-08-31 LAB — POCT INR: INR: 1.6

## 2017-08-31 NOTE — Patient Instructions (Signed)
Take 3 tablets tonight then resume 2 tablets daily except 1 tablet on Fridays.   Recheck in 1 week.  S/P amputation of toe 08/27/17.  Order given to [place]

## 2017-09-01 ENCOUNTER — Other Ambulatory Visit (HOSPITAL_COMMUNITY): Payer: Self-pay

## 2017-09-01 ENCOUNTER — Encounter (HOSPITAL_COMMUNITY): Payer: Self-pay | Admitting: Vascular Surgery

## 2017-09-01 DIAGNOSIS — C61 Malignant neoplasm of prostate: Secondary | ICD-10-CM

## 2017-09-01 DIAGNOSIS — C7951 Secondary malignant neoplasm of bone: Secondary | ICD-10-CM

## 2017-09-02 DIAGNOSIS — L97509 Non-pressure chronic ulcer of other part of unspecified foot with unspecified severity: Secondary | ICD-10-CM | POA: Diagnosis not present

## 2017-09-03 DIAGNOSIS — E1151 Type 2 diabetes mellitus with diabetic peripheral angiopathy without gangrene: Secondary | ICD-10-CM | POA: Diagnosis not present

## 2017-09-03 DIAGNOSIS — I482 Chronic atrial fibrillation: Secondary | ICD-10-CM | POA: Diagnosis not present

## 2017-09-03 DIAGNOSIS — M86171 Other acute osteomyelitis, right ankle and foot: Secondary | ICD-10-CM | POA: Diagnosis not present

## 2017-09-03 DIAGNOSIS — D519 Vitamin B12 deficiency anemia, unspecified: Secondary | ICD-10-CM | POA: Diagnosis not present

## 2017-09-03 DIAGNOSIS — Z4781 Encounter for orthopedic aftercare following surgical amputation: Secondary | ICD-10-CM | POA: Diagnosis not present

## 2017-09-03 DIAGNOSIS — I70203 Unspecified atherosclerosis of native arteries of extremities, bilateral legs: Secondary | ICD-10-CM | POA: Diagnosis not present

## 2017-09-06 ENCOUNTER — Encounter (HOSPITAL_COMMUNITY): Payer: Self-pay

## 2017-09-06 ENCOUNTER — Ambulatory Visit (INDEPENDENT_AMBULATORY_CARE_PROVIDER_SITE_OTHER): Payer: Medicare Other | Admitting: *Deleted

## 2017-09-06 ENCOUNTER — Other Ambulatory Visit: Payer: Self-pay

## 2017-09-06 ENCOUNTER — Inpatient Hospital Stay (HOSPITAL_COMMUNITY): Payer: Medicare Other | Attending: Hematology

## 2017-09-06 ENCOUNTER — Inpatient Hospital Stay (HOSPITAL_COMMUNITY): Payer: Medicare Other

## 2017-09-06 VITALS — BP 151/93 | HR 92 | Temp 97.6°F | Resp 16

## 2017-09-06 DIAGNOSIS — Z79818 Long term (current) use of other agents affecting estrogen receptors and estrogen levels: Secondary | ICD-10-CM | POA: Diagnosis not present

## 2017-09-06 DIAGNOSIS — Z5181 Encounter for therapeutic drug level monitoring: Secondary | ICD-10-CM

## 2017-09-06 DIAGNOSIS — C7951 Secondary malignant neoplasm of bone: Secondary | ICD-10-CM | POA: Insufficient documentation

## 2017-09-06 DIAGNOSIS — I4891 Unspecified atrial fibrillation: Secondary | ICD-10-CM | POA: Diagnosis not present

## 2017-09-06 DIAGNOSIS — Z4781 Encounter for orthopedic aftercare following surgical amputation: Secondary | ICD-10-CM | POA: Diagnosis not present

## 2017-09-06 DIAGNOSIS — E1151 Type 2 diabetes mellitus with diabetic peripheral angiopathy without gangrene: Secondary | ICD-10-CM | POA: Diagnosis not present

## 2017-09-06 DIAGNOSIS — D519 Vitamin B12 deficiency anemia, unspecified: Secondary | ICD-10-CM | POA: Diagnosis not present

## 2017-09-06 DIAGNOSIS — M86171 Other acute osteomyelitis, right ankle and foot: Secondary | ICD-10-CM | POA: Diagnosis not present

## 2017-09-06 DIAGNOSIS — C61 Malignant neoplasm of prostate: Secondary | ICD-10-CM | POA: Insufficient documentation

## 2017-09-06 DIAGNOSIS — I70203 Unspecified atherosclerosis of native arteries of extremities, bilateral legs: Secondary | ICD-10-CM | POA: Diagnosis not present

## 2017-09-06 DIAGNOSIS — I482 Chronic atrial fibrillation: Secondary | ICD-10-CM | POA: Diagnosis not present

## 2017-09-06 LAB — CBC WITH DIFFERENTIAL/PLATELET
Basophils Absolute: 0.1 10*3/uL (ref 0.0–0.1)
Basophils Relative: 1 %
Eosinophils Absolute: 0.1 10*3/uL (ref 0.0–0.7)
Eosinophils Relative: 1 %
HCT: 38 % — ABNORMAL LOW (ref 39.0–52.0)
Hemoglobin: 13.1 g/dL (ref 13.0–17.0)
Lymphocytes Relative: 35 %
Lymphs Abs: 2.8 10*3/uL (ref 0.7–4.0)
MCH: 31.1 pg (ref 26.0–34.0)
MCHC: 34.5 g/dL (ref 30.0–36.0)
MCV: 90.3 fL (ref 78.0–100.0)
Monocytes Absolute: 1 10*3/uL (ref 0.1–1.0)
Monocytes Relative: 12 %
Neutro Abs: 4 10*3/uL (ref 1.7–7.7)
Neutrophils Relative %: 51 %
Platelets: 220 10*3/uL (ref 150–400)
RBC: 4.21 MIL/uL — ABNORMAL LOW (ref 4.22–5.81)
RDW: 13.1 % (ref 11.5–15.5)
WBC: 8 10*3/uL (ref 4.0–10.5)

## 2017-09-06 LAB — COMPREHENSIVE METABOLIC PANEL
ALT: 13 U/L — ABNORMAL LOW (ref 17–63)
AST: 17 U/L (ref 15–41)
Albumin: 3.9 g/dL (ref 3.5–5.0)
Alkaline Phosphatase: 80 U/L (ref 38–126)
Anion gap: 9 (ref 5–15)
BUN: 16 mg/dL (ref 6–20)
CO2: 28 mmol/L (ref 22–32)
Calcium: 10.4 mg/dL — ABNORMAL HIGH (ref 8.9–10.3)
Chloride: 102 mmol/L (ref 101–111)
Creatinine, Ser: 0.73 mg/dL (ref 0.61–1.24)
GFR calc Af Amer: >60 mL/min (ref >60)
GFR calc non Af Amer: >60 mL/min (ref >60)
Glucose, Bld: 173 mg/dL — ABNORMAL HIGH (ref 65–99)
Potassium: 4 mmol/L (ref 3.5–5.1)
Sodium: 139 mmol/L (ref 135–145)
Total Bilirubin: 0.7 mg/dL (ref 0.3–1.2)
Total Protein: 6.9 g/dL (ref 6.5–8.1)

## 2017-09-06 LAB — POCT INR: INR: 2.6

## 2017-09-06 LAB — PSA: Prostatic Specific Antigen: 0.01 ng/mL (ref 0.00–4.00)

## 2017-09-06 MED ORDER — LEUPROLIDE ACETATE (6 MONTH) 45 MG IM KIT
45.0000 mg | PACK | Freq: Once | INTRAMUSCULAR | Status: AC
  Administered 2017-09-06: 45 mg via INTRAMUSCULAR
  Filled 2017-09-06: qty 45

## 2017-09-06 MED ORDER — DENOSUMAB 120 MG/1.7ML ~~LOC~~ SOLN
120.0000 mg | Freq: Once | SUBCUTANEOUS | Status: AC
  Administered 2017-09-06: 120 mg via SUBCUTANEOUS
  Filled 2017-09-06: qty 1.7

## 2017-09-06 NOTE — Progress Notes (Signed)
[name] presents today for injection per the provider's orders.  Xgeva and Lupron administration without incident; see MAR for injection details.  Patient tolerated procedure well and without incident.  No questions or complaints noted at this time.  Discharged ambulatory in c/o spouse.

## 2017-09-06 NOTE — Patient Instructions (Signed)
Continue coumadin 2 tablets daily except 1 tablet on Fridays.   Recheck in 1 week.  S/P amputation of toe 08/27/17.  Order given to [place]

## 2017-09-08 DIAGNOSIS — I70203 Unspecified atherosclerosis of native arteries of extremities, bilateral legs: Secondary | ICD-10-CM | POA: Diagnosis not present

## 2017-09-08 DIAGNOSIS — E1151 Type 2 diabetes mellitus with diabetic peripheral angiopathy without gangrene: Secondary | ICD-10-CM | POA: Diagnosis not present

## 2017-09-08 DIAGNOSIS — I482 Chronic atrial fibrillation: Secondary | ICD-10-CM | POA: Diagnosis not present

## 2017-09-08 DIAGNOSIS — M86171 Other acute osteomyelitis, right ankle and foot: Secondary | ICD-10-CM | POA: Diagnosis not present

## 2017-09-08 DIAGNOSIS — Z4781 Encounter for orthopedic aftercare following surgical amputation: Secondary | ICD-10-CM | POA: Diagnosis not present

## 2017-09-08 DIAGNOSIS — D519 Vitamin B12 deficiency anemia, unspecified: Secondary | ICD-10-CM | POA: Diagnosis not present

## 2017-09-08 MED ORDER — CYANOCOBALAMIN 1000 MCG/ML IJ SOLN
INTRAMUSCULAR | Status: AC
  Filled 2017-09-08: qty 1

## 2017-09-14 ENCOUNTER — Ambulatory Visit (INDEPENDENT_AMBULATORY_CARE_PROVIDER_SITE_OTHER): Payer: Medicare Other | Admitting: *Deleted

## 2017-09-14 DIAGNOSIS — R300 Dysuria: Secondary | ICD-10-CM | POA: Diagnosis not present

## 2017-09-14 DIAGNOSIS — F339 Major depressive disorder, recurrent, unspecified: Secondary | ICD-10-CM | POA: Diagnosis not present

## 2017-09-14 DIAGNOSIS — I4891 Unspecified atrial fibrillation: Secondary | ICD-10-CM

## 2017-09-14 DIAGNOSIS — M069 Rheumatoid arthritis, unspecified: Secondary | ICD-10-CM | POA: Diagnosis not present

## 2017-09-14 DIAGNOSIS — E559 Vitamin D deficiency, unspecified: Secondary | ICD-10-CM | POA: Diagnosis not present

## 2017-09-14 DIAGNOSIS — Z0000 Encounter for general adult medical examination without abnormal findings: Secondary | ICD-10-CM | POA: Diagnosis not present

## 2017-09-14 DIAGNOSIS — R531 Weakness: Secondary | ICD-10-CM | POA: Diagnosis not present

## 2017-09-14 DIAGNOSIS — Z5181 Encounter for therapeutic drug level monitoring: Secondary | ICD-10-CM

## 2017-09-14 DIAGNOSIS — R634 Abnormal weight loss: Secondary | ICD-10-CM | POA: Diagnosis not present

## 2017-09-14 DIAGNOSIS — I70203 Unspecified atherosclerosis of native arteries of extremities, bilateral legs: Secondary | ICD-10-CM | POA: Diagnosis not present

## 2017-09-14 DIAGNOSIS — E1165 Type 2 diabetes mellitus with hyperglycemia: Secondary | ICD-10-CM | POA: Diagnosis not present

## 2017-09-14 DIAGNOSIS — M549 Dorsalgia, unspecified: Secondary | ICD-10-CM | POA: Diagnosis not present

## 2017-09-14 DIAGNOSIS — Z4781 Encounter for orthopedic aftercare following surgical amputation: Secondary | ICD-10-CM | POA: Diagnosis not present

## 2017-09-14 DIAGNOSIS — I10 Essential (primary) hypertension: Secondary | ICD-10-CM | POA: Diagnosis not present

## 2017-09-14 DIAGNOSIS — M86171 Other acute osteomyelitis, right ankle and foot: Secondary | ICD-10-CM | POA: Diagnosis not present

## 2017-09-14 DIAGNOSIS — D649 Anemia, unspecified: Secondary | ICD-10-CM | POA: Diagnosis not present

## 2017-09-14 DIAGNOSIS — I482 Chronic atrial fibrillation: Secondary | ICD-10-CM | POA: Diagnosis not present

## 2017-09-14 DIAGNOSIS — N390 Urinary tract infection, site not specified: Secondary | ICD-10-CM | POA: Diagnosis not present

## 2017-09-14 DIAGNOSIS — I2510 Atherosclerotic heart disease of native coronary artery without angina pectoris: Secondary | ICD-10-CM | POA: Diagnosis not present

## 2017-09-14 DIAGNOSIS — R42 Dizziness and giddiness: Secondary | ICD-10-CM | POA: Diagnosis not present

## 2017-09-14 DIAGNOSIS — E1151 Type 2 diabetes mellitus with diabetic peripheral angiopathy without gangrene: Secondary | ICD-10-CM | POA: Diagnosis not present

## 2017-09-14 DIAGNOSIS — E119 Type 2 diabetes mellitus without complications: Secondary | ICD-10-CM | POA: Diagnosis not present

## 2017-09-14 DIAGNOSIS — D519 Vitamin B12 deficiency anemia, unspecified: Secondary | ICD-10-CM | POA: Diagnosis not present

## 2017-09-14 LAB — POCT INR: INR: 2.5 (ref 2.0–3.0)

## 2017-09-14 NOTE — Patient Instructions (Signed)
Continue coumadin 2 tablets daily except 1 tablet on Fridays.   Recheck in 1 week.  S/P amputation of toe 08/27/17.  Order given to [place]

## 2017-09-16 DIAGNOSIS — Z4781 Encounter for orthopedic aftercare following surgical amputation: Secondary | ICD-10-CM | POA: Diagnosis not present

## 2017-09-16 DIAGNOSIS — I482 Chronic atrial fibrillation: Secondary | ICD-10-CM | POA: Diagnosis not present

## 2017-09-16 DIAGNOSIS — E1151 Type 2 diabetes mellitus with diabetic peripheral angiopathy without gangrene: Secondary | ICD-10-CM | POA: Diagnosis not present

## 2017-09-16 DIAGNOSIS — M86171 Other acute osteomyelitis, right ankle and foot: Secondary | ICD-10-CM | POA: Diagnosis not present

## 2017-09-16 DIAGNOSIS — D519 Vitamin B12 deficiency anemia, unspecified: Secondary | ICD-10-CM | POA: Diagnosis not present

## 2017-09-16 DIAGNOSIS — I70203 Unspecified atherosclerosis of native arteries of extremities, bilateral legs: Secondary | ICD-10-CM | POA: Diagnosis not present

## 2017-09-17 DIAGNOSIS — I482 Chronic atrial fibrillation: Secondary | ICD-10-CM | POA: Diagnosis not present

## 2017-09-17 DIAGNOSIS — M86171 Other acute osteomyelitis, right ankle and foot: Secondary | ICD-10-CM | POA: Diagnosis not present

## 2017-09-17 DIAGNOSIS — Z4781 Encounter for orthopedic aftercare following surgical amputation: Secondary | ICD-10-CM | POA: Diagnosis not present

## 2017-09-17 DIAGNOSIS — D519 Vitamin B12 deficiency anemia, unspecified: Secondary | ICD-10-CM | POA: Diagnosis not present

## 2017-09-17 DIAGNOSIS — E1151 Type 2 diabetes mellitus with diabetic peripheral angiopathy without gangrene: Secondary | ICD-10-CM | POA: Diagnosis not present

## 2017-09-17 DIAGNOSIS — I70203 Unspecified atherosclerosis of native arteries of extremities, bilateral legs: Secondary | ICD-10-CM | POA: Diagnosis not present

## 2017-09-20 ENCOUNTER — Ambulatory Visit (INDEPENDENT_AMBULATORY_CARE_PROVIDER_SITE_OTHER): Payer: Medicare Other | Admitting: *Deleted

## 2017-09-20 DIAGNOSIS — I4891 Unspecified atrial fibrillation: Secondary | ICD-10-CM

## 2017-09-20 DIAGNOSIS — Z5181 Encounter for therapeutic drug level monitoring: Secondary | ICD-10-CM | POA: Diagnosis not present

## 2017-09-20 DIAGNOSIS — Z4781 Encounter for orthopedic aftercare following surgical amputation: Secondary | ICD-10-CM | POA: Diagnosis not present

## 2017-09-20 DIAGNOSIS — I70203 Unspecified atherosclerosis of native arteries of extremities, bilateral legs: Secondary | ICD-10-CM | POA: Diagnosis not present

## 2017-09-20 DIAGNOSIS — E1151 Type 2 diabetes mellitus with diabetic peripheral angiopathy without gangrene: Secondary | ICD-10-CM | POA: Diagnosis not present

## 2017-09-20 DIAGNOSIS — I482 Chronic atrial fibrillation: Secondary | ICD-10-CM | POA: Diagnosis not present

## 2017-09-20 DIAGNOSIS — D519 Vitamin B12 deficiency anemia, unspecified: Secondary | ICD-10-CM | POA: Diagnosis not present

## 2017-09-20 DIAGNOSIS — M86171 Other acute osteomyelitis, right ankle and foot: Secondary | ICD-10-CM | POA: Diagnosis not present

## 2017-09-20 LAB — POCT INR: INR: 2.4 (ref 2.0–3.0)

## 2017-09-20 NOTE — Patient Instructions (Signed)
Continue coumadin 2 tablets daily except 1 tablet on Fridays.   Recheck in 1 week.  S/P amputation of toe 08/27/17.  Order given to [place]

## 2017-09-22 DIAGNOSIS — E1151 Type 2 diabetes mellitus with diabetic peripheral angiopathy without gangrene: Secondary | ICD-10-CM | POA: Diagnosis not present

## 2017-09-22 DIAGNOSIS — M86171 Other acute osteomyelitis, right ankle and foot: Secondary | ICD-10-CM | POA: Diagnosis not present

## 2017-09-22 DIAGNOSIS — I482 Chronic atrial fibrillation: Secondary | ICD-10-CM | POA: Diagnosis not present

## 2017-09-22 DIAGNOSIS — Z4781 Encounter for orthopedic aftercare following surgical amputation: Secondary | ICD-10-CM | POA: Diagnosis not present

## 2017-09-22 DIAGNOSIS — I70203 Unspecified atherosclerosis of native arteries of extremities, bilateral legs: Secondary | ICD-10-CM | POA: Diagnosis not present

## 2017-09-22 DIAGNOSIS — D519 Vitamin B12 deficiency anemia, unspecified: Secondary | ICD-10-CM | POA: Diagnosis not present

## 2017-09-23 ENCOUNTER — Other Ambulatory Visit: Payer: Self-pay

## 2017-09-23 ENCOUNTER — Encounter: Payer: Self-pay | Admitting: Vascular Surgery

## 2017-09-23 ENCOUNTER — Ambulatory Visit (INDEPENDENT_AMBULATORY_CARE_PROVIDER_SITE_OTHER): Payer: Medicare Other | Admitting: Vascular Surgery

## 2017-09-23 VITALS — BP 141/72 | HR 59 | Temp 97.4°F | Resp 20 | Ht 70.0 in | Wt 160.0 lb

## 2017-09-23 DIAGNOSIS — I739 Peripheral vascular disease, unspecified: Secondary | ICD-10-CM

## 2017-09-23 NOTE — Progress Notes (Signed)
Patient is a 84 year old male who returns today for postoperative follow-up after amputation of the left second toe and resection of the metatarsal head.  He denies any incisional drainage.  He previously had amputation of the first toe which healed well.  He is walking some.  Physical exam:  Vitals:   09/23/17 1522  BP: (!) 141/72  Pulse: (!) 59  Resp: 20  Temp: (!) 97.4 F (36.3 C)  TempSrc: Oral  SpO2: 99%  Weight: 160 lb (72.6 kg)  Height: 5\' 10"  (1.778 m)    Left lower extremity: Well-healed second toe and first toe amputation sites.  No drainage no erythema.  Assessment: Healing toe amputations  Plan: Emphasized the patient good foot care and continued follow-up with podiatry since both of his previous ulcerations were related to nail issues.  Patient will follow-up with me on an as-needed basis.  Ruta Hinds, MD [facility] [place] Office: [phone] Pager: [phone]

## 2017-09-27 ENCOUNTER — Ambulatory Visit (INDEPENDENT_AMBULATORY_CARE_PROVIDER_SITE_OTHER): Payer: Medicare Other | Admitting: *Deleted

## 2017-09-27 DIAGNOSIS — D519 Vitamin B12 deficiency anemia, unspecified: Secondary | ICD-10-CM | POA: Diagnosis not present

## 2017-09-27 DIAGNOSIS — I482 Chronic atrial fibrillation: Secondary | ICD-10-CM | POA: Diagnosis not present

## 2017-09-27 DIAGNOSIS — I70203 Unspecified atherosclerosis of native arteries of extremities, bilateral legs: Secondary | ICD-10-CM | POA: Diagnosis not present

## 2017-09-27 DIAGNOSIS — I4891 Unspecified atrial fibrillation: Secondary | ICD-10-CM

## 2017-09-27 DIAGNOSIS — Z4781 Encounter for orthopedic aftercare following surgical amputation: Secondary | ICD-10-CM | POA: Diagnosis not present

## 2017-09-27 DIAGNOSIS — M86171 Other acute osteomyelitis, right ankle and foot: Secondary | ICD-10-CM | POA: Diagnosis not present

## 2017-09-27 DIAGNOSIS — E1151 Type 2 diabetes mellitus with diabetic peripheral angiopathy without gangrene: Secondary | ICD-10-CM | POA: Diagnosis not present

## 2017-09-27 LAB — POCT INR: INR: 2.2 (ref 2.0–3.0)

## 2017-09-27 NOTE — Patient Instructions (Signed)
Continue coumadin 2 tablets daily except 1 tablet on Fridays.   Recheck in 1 week.  S/P amputation of toe 08/27/17.  Order given to Kim Pulliam RN [facility] 

## 2017-09-29 ENCOUNTER — Emergency Department (HOSPITAL_COMMUNITY)
Admission: EM | Admit: 2017-09-29 | Discharge: 2017-09-29 | Disposition: A | Discharge status: Home / Self Care | Payer: Medicare Other | Attending: Emergency Medicine | Admitting: Emergency Medicine

## 2017-09-29 ENCOUNTER — Encounter (HOSPITAL_COMMUNITY): Payer: Self-pay | Admitting: Emergency Medicine

## 2017-09-29 ENCOUNTER — Other Ambulatory Visit: Payer: Self-pay

## 2017-09-29 ENCOUNTER — Emergency Department (HOSPITAL_COMMUNITY): Payer: Medicare Other

## 2017-09-29 DIAGNOSIS — E119 Type 2 diabetes mellitus without complications: Secondary | ICD-10-CM | POA: Insufficient documentation

## 2017-09-29 DIAGNOSIS — I10 Essential (primary) hypertension: Secondary | ICD-10-CM | POA: Diagnosis not present

## 2017-09-29 DIAGNOSIS — Y999 Unspecified external cause status: Secondary | ICD-10-CM | POA: Insufficient documentation

## 2017-09-29 DIAGNOSIS — Y939 Activity, unspecified: Secondary | ICD-10-CM | POA: Insufficient documentation

## 2017-09-29 DIAGNOSIS — Y929 Unspecified place or not applicable: Secondary | ICD-10-CM | POA: Diagnosis not present

## 2017-09-29 DIAGNOSIS — Z7901 Long term (current) use of anticoagulants: Secondary | ICD-10-CM | POA: Diagnosis not present

## 2017-09-29 DIAGNOSIS — S0101XA Laceration without foreign body of scalp, initial encounter: Secondary | ICD-10-CM | POA: Diagnosis not present

## 2017-09-29 DIAGNOSIS — Z7984 Long term (current) use of oral hypoglycemic drugs: Secondary | ICD-10-CM | POA: Insufficient documentation

## 2017-09-29 DIAGNOSIS — W108XXA Fall (on) (from) other stairs and steps, initial encounter: Secondary | ICD-10-CM | POA: Diagnosis not present

## 2017-09-29 DIAGNOSIS — Z79899 Other long term (current) drug therapy: Secondary | ICD-10-CM | POA: Insufficient documentation

## 2017-09-29 DIAGNOSIS — S0990XA Unspecified injury of head, initial encounter: Secondary | ICD-10-CM | POA: Diagnosis not present

## 2017-09-29 DIAGNOSIS — W19XXXA Unspecified fall, initial encounter: Secondary | ICD-10-CM

## 2017-09-29 LAB — CBG MONITORING, ED: Glucose-Capillary: 198 mg/dL — ABNORMAL HIGH (ref 65–99)

## 2017-09-29 LAB — CBC
HCT: 36 % — ABNORMAL LOW (ref 39.0–52.0)
Hemoglobin: 12.4 g/dL — ABNORMAL LOW (ref 13.0–17.0)
MCH: 31 pg (ref 26.0–34.0)
MCHC: 34.4 g/dL (ref 30.0–36.0)
MCV: 90 fL (ref 78.0–100.0)
Platelets: 142 10*3/uL — ABNORMAL LOW (ref 150–400)
RBC: 4 MIL/uL — ABNORMAL LOW (ref 4.22–5.81)
RDW: 13 % (ref 11.5–15.5)
WBC: 5.7 10*3/uL (ref 4.0–10.5)

## 2017-09-29 LAB — PROTIME-INR
INR: 2.12
Prothrombin Time: 23.6 seconds — ABNORMAL HIGH (ref 11.4–15.2)

## 2017-09-29 LAB — BASIC METABOLIC PANEL
Anion gap: 10 (ref 5–15)
BUN: 16 mg/dL (ref 6–20)
CO2: 27 mmol/L (ref 22–32)
Calcium: 9.8 mg/dL (ref 8.9–10.3)
Chloride: 100 mmol/L — ABNORMAL LOW (ref 101–111)
Creatinine, Ser: 0.76 mg/dL (ref 0.61–1.24)
GFR calc Af Amer: >60 mL/min (ref >60)
GFR calc non Af Amer: >60 mL/min (ref >60)
Glucose, Bld: 174 mg/dL — ABNORMAL HIGH (ref 65–99)
Potassium: 3.6 mmol/L (ref 3.5–5.1)
Sodium: 137 mmol/L (ref 135–145)

## 2017-09-29 MED ORDER — LIDOCAINE-EPINEPHRINE (PF) 2 %-1:200000 IJ SOLN
10.0000 mL | Freq: Once | INTRAMUSCULAR | Status: DC
  Filled 2017-09-29: qty 20

## 2017-09-29 MED ORDER — TETANUS-DIPHTH-ACELL PERTUSSIS 5-2.5-18.5 LF-MCG/0.5 IM SUSP
0.5000 mL | Freq: Once | INTRAMUSCULAR | Status: DC
  Filled 2017-09-29 (×2): qty 0.5

## 2017-09-29 NOTE — ED Provider Notes (Signed)
[facility] EMERGENCY DEPARTMENT Provider Note   CSN: [id number] Arrival date & time: 09/29/17  1421     History   Chief Complaint Chief Complaint  Patient presents with  . Fall    HPI SOMA LIZAK is a 84 y.o. male.  HPI Pt was standing on a step when he turned to turn off a light.  He lost his balance and fell off the step.  Pt struck the back of his head.  He was unable to get up on his own and had to call for family.  Pt has pain in the back of his head and sustained a laceration.  He is on coumadin.  Pt denies any other injuries.  No chest pain or shortness of breath.  Wife also mentioned incidental rash in the buttock region. Past Medical History:  Diagnosis Date  . Acute bronchitis   . Arthritis   . Atrial fibrillation ([place])    Dr. Harl Bowie- [place] follows saw 11'14  . Bladder stones    tx. with oral meds and antibiotics, now surgery planned  . Cataracts, both eyes    surgery planned May 2015  . Diabetes mellitus without complication ([place])    Type II  . Dyspnea    with activity  . Dysrhythmia    Afib  . Family history of breast cancer   . Family history of colon cancer   . GERD (gastroesophageal reflux disease)   . 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 Margot Chimes, thus far without recurrence.   . Hypertension   . NHL (non-Hodgkin's lymphoma) ([place]) 2005   Diffuse large B-cell lymphoma, clinically stage IIIA, CD20 positive, status post cervical lymph node biopsy 08/03/2003 on the left. PET scan was also positive in the spleen and small bowel region, but bone marrow aspiration and biopsy were negative. So he essentially had stage IIIAs. He received R-CHOP x6 cycles with CR established by PET scan criteria on 11/23/2003 with no  evidence for relapse th  . PAD (peripheral artery disease) ([place]) 08/27/2017  . Skin cancer    Basal cell- face,head, neck,  arms, legs, Back    Patient Active Problem List   Diagnosis Date Noted  . PAD (peripheral artery disease) ([place]) 08/27/2017  . Great toe amputation status, left ([place]) 07/30/2017  . Osteomyelitis of toe of left foot ([place]) 06/29/2017  . Acute bronchitis with bronchospasm 03/16/2017  . Type 2 diabetes mellitus ([place]) 03/16/2017  . Hypomagnesemia 03/16/2017  . 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 Postural dizziness with presyncope 02/19/2017  . Polyneuropathy 02/19/2017  . Numbness 02/19/2017  . Urinary frequency 02/19/2017  . Genetic testing 02/09/2017  . Family history of breast cancer   . Family history of colon cancer   . Other dysphagia 01/21/2016  . Bone metastases ([place]) 08/21/2015  . Prostate cancer ([place]) 09/08/2013  . Hypertrophy of prostate with urinary obstruction and other lower urinary tract symptoms (LUTS) 08/10/2013  . Encounter for therapeutic drug monitoring 06/12/2013  . NHL (non-Hodgkin's lymphoma) ([place]) 08/18/2011  . GIST (gastrointestinal stromal tumor), malignant ([place]) 08/18/2011  . Long term (current) use of anticoagulants 07/18/2010  . Essential hypertension 10/04/2008  . ATRIAL FIBRILLATION 10/04/2008    Past Surgical History:  Procedure Laterality Date  . AMPUTATION Left 06/29/2017  Procedure: AMPUTATION LEFT GREAT TOE;  Surgeon: [name], MD;  Location: [facility];  Service: Vascular;  Laterality: Left;  . AMPUTATION Left 08/27/2017   Procedure: AMPUTATION Left SECOND TOE;  Surgeon: [name], MD;  Location: [facility];  Service: Vascular;  Laterality: Left;  . CATARACT EXTRACTION Bilateral 2015  . CHOLECYSTECTOMY    . COLON RESECTION     small bowel  . CYSTOSCOPY WITH LITHOLAPAXY N/A 08/10/2013   Procedure: CYSTOSCOPY WITH LITHOLAPAXY WITH [name];  Surgeon: [name], MD;  Location: [facility];   Service: Urology;  Laterality: N/A;  . ESOPHAGEAL DILATION N/A 02/27/2016   Procedure: ESOPHAGEAL DILATION;  Surgeon: [name], MD;  [facility];  Service: Endoscopy;  Laterality: N/A;  . ESOPHAGOGASTRODUODENOSCOPY N/A 02/27/2016   Procedure: ESOPHAGOGASTRODUODENOSCOPY (EGD);  Surgeon: [name], MD;  [facility];  Service: Endoscopy;  Laterality: N/A;  12:00  . INCISION AND DRAINAGE PERIRECTAL ABSCESS    . LOWER EXTREMITY ANGIOGRAPHY N/A 06/25/2017   Procedure: LOWER EXTREMITY ANGIOGRAPHY;  Surgeon: [name], MD;  Location: [facility];  Service: Cardiovascular;  Laterality: N/A;  . LYMPH NODE DISSECTION Left    '05-neck  . PORT-A-CATH REMOVAL    . PORTACATH PLACEMENT     insertion and removal -last chemotherapy 10 yrs ago  . TRANSURETHRAL RESECTION OF PROSTATE N/A 08/10/2013   Procedure: TRANSURETHRAL RESECTION OF THE PROSTATE WITH GYRUS INSTRUMENTS;  Surgeon: [name], MD;  Location: [facility];  Service: Urology;  Laterality: N/A;        Home Medications    Prior to Admission medications   Medication Sig Start Date End Date Taking? Authorizing Provider  abiraterone acetate (ZYTIGA) 250 MG tablet Take 4 tablets (1,000 mg total) by mouth daily. Take on an empty stomach 1 hour before or 2 hours after a meal 04/21/17  Yes Burns, Wandra Feinstein, NP  acetaminophen (TYLENOL) 500 MG tablet Take 500-1,000 mg by mouth daily as needed for mild pain, fever or headache.   Yes [provider]  amitriptyline (ELAVIL) 25 MG tablet Take 25 mg by mouth at bedtime.   Yes [provider]  CALCIUM CARB-CHOLECALCIFEROL PO Take 1 tablet by mouth daily.    Yes [provider]  clotrimazole-betamethasone (LOTRISONE) cream Apply 1 application topically daily as needed (for fungus).  06/18/15  Yes [provider]  CRANBERRY PO Take 4,200 mg by mouth daily.   Yes [provider]  cyanocobalamin (,VITAMIN B-12,) 1000  MCG/ML injection Inject 1,000 mcg every 30 (thirty) days into the skin. 02/25/17  Yes [provider]  denosumab (XGEVA) 120 MG/1.7ML SOLN injection Inject 120 mg into the skin every 30 (thirty) days.    Yes [provider]  diltiazem (CARDIZEM) 90 MG tablet TAKE 90 MG BY MOUTH TWICE DAILY 07/29/17  Yes Branch, Alphonse Guild, MD  diphenhydrAMINE (BENADRYL) 25 MG tablet Take 25 mg by mouth every 6 (six) hours as needed for itching.   Yes [provider]  docusate sodium (COLACE) 100 MG capsule Take 100 mg by mouth daily as needed for mild constipation.   Yes [provider]  furosemide (LASIX) 20 MG tablet Take 1 tablet (20 mg total) by mouth daily. 03/18/17 03/18/18 Yes Kathie Dike, MD  glipiZIDE (GLUCOTROL XL) 5 MG 24 hr tablet Take 5 mg by mouth daily with breakfast.  06/25/15  Yes [provider]  Leuprolide Acetate, 6 Month, (LUPRON DEPOT) 45 MG injection Inject 45 mg  into the muscle every 6 (six) months.   Yes [provider]  lisinopril (PRINIVIL,ZESTRIL) 20 MG tablet Take 1 tablet (20 mg total) daily by mouth. 02/25/17  Yes Sater, [name], MD  loperamide (IMODIUM) 2 MG capsule Take 2 mg by mouth as needed for diarrhea or loose stools.   Yes [provider]  metFORMIN (GLUCOPHAGE) 850 MG tablet Take 850 mg by mouth 2 (two) times daily with a meal.  03/23/17  Yes [provider]  nystatin (MYCOSTATIN) powder Apply 1 g topically 4 (four) times daily as needed (fungus).    Yes [provider]  omeprazole (PRILOSEC) 20 MG capsule Take 20 mg by mouth daily.    Yes [provider]  phenylephrine-shark liver oil-mineral oil-petrolatum (PREPARATION H) 0.25-3-14-71.9 % rectal ointment Place 1 application rectally 2 (two) times daily as needed (for chaffing).   Yes [provider]  potassium chloride (K-DUR,KLOR-CON) 10 MEQ tablet Take 2 tablets (20 mEq total) by mouth daily. 06/21/17  Yes Higgs, Mathis Dad, MD    predniSONE (DELTASONE) 5 MG tablet Take 1 tablet (5 mg total) by mouth 2 (two) times daily with a meal. Patient taking differently: Take 2.5 mg by mouth 2 (two) times daily with a meal.  05/17/17  Yes Holley Bouche, NP  sennosides-docusate sodium (SENOKOT-S) 8.6-50 MG tablet Take 1 tablet by mouth daily as needed for constipation.    Yes [provider]  VENTOLIN HFA 108 (90 Base) MCG/ACT inhaler Inhale 1-2 puffs into the lungs every 6 (six) hours as needed for wheezing or shortness of breath.  03/08/17  Yes [provider]  warfarin (COUMADIN) 2.5 MG tablet Take 2 tablets daily except 1 tablet on Tuesdays and Fridays or as directed Patient taking differently: Take 2.5-5 mg by mouth See admin instructions. Take 2 tablets daily except 1 tablet on  Fridays or as directed 08/11/17  Yes Branch, Alphonse Guild, MD    Family History Family History  Problem Relation Age of Onset  . Heart attack Mother        died in her 37s  . Other Father 45       pneumonia - died  . Breast cancer Sister        dx in her 85s-60s  . Colon cancer Brother        dx in his 37s  . Cancer Sister        TAH/BSO due to "male cancer"  . Breast cancer Daughter 78  . Breast cancer Daughter 3       DCIS - ATM VUS on [employer] 83 gene panel in 2018  . Breast cancer Daughter 52       DCIS - Negative on [employer] MyRisk 25 gene apnel in 2015  . Thyroid cancer Daughter 27       Medullary and Papillary  . Thyroid nodules Grandchild     Social History Social History   Tobacco Use  . Smoking status: Never Smoker  . Smokeless tobacco: Never Used  Substance Use Topics  . Alcohol use: No  . Drug use: No     Allergies   Augmentin [amoxicillin-pot clavulanate]   Review of Systems Review of Systems  All other systems reviewed and are negative.    Physical Exam Updated Vital Signs BP 139/68 (BP Location: Right Arm)   Pulse 85   Temp 97.7 F (36.5 C) (Oral)   Resp 16   Ht 1.778 m (5' 10")    Wt 72.6 kg (  160 lb)   SpO2 99%   BMI 22.96 kg/m   Physical Exam  Constitutional: He appears well-developed and well-nourished. No distress.  HENT:  Head: Normocephalic. Head is without raccoon's eyes and without Battle's sign.  Right Ear: External ear normal.  Left Ear: External ear normal.  Laceration posterior occiput, small amount of bleeding  Eyes: Lids are normal. Right eye exhibits no discharge. Right conjunctiva has no hemorrhage. Left conjunctiva has no hemorrhage.  Neck: No spinous process tenderness present. No tracheal deviation and no edema present.  Cardiovascular: Normal rate, regular rhythm and normal heart sounds.  Pulmonary/Chest: Effort normal and breath sounds normal. No stridor. No respiratory distress. He exhibits no tenderness, no crepitus and no deformity.  Abdominal: Soft. Normal appearance and bowel sounds are normal. He exhibits no distension and no mass. There is no tenderness.  Negative for seat belt sign  Musculoskeletal:       Right shoulder: He exhibits no tenderness, no bony tenderness and no swelling.       Left shoulder: He exhibits no tenderness, no bony tenderness and no swelling.       Right wrist: He exhibits no tenderness, no bony tenderness and no swelling.       Left wrist: He exhibits no tenderness, no bony tenderness and no swelling.       Right hip: He exhibits normal range of motion, no tenderness, no bony tenderness and no swelling.       Left hip: He exhibits normal range of motion, no tenderness and no bony tenderness.       Right ankle: He exhibits no swelling. No tenderness.       Left ankle: He exhibits no swelling. No tenderness.       Cervical back: He exhibits no tenderness, no bony tenderness, no swelling and no deformity.       Thoracic back: He exhibits no tenderness, no bony tenderness, no swelling and no deformity.       Lumbar back: He exhibits no tenderness, no bony tenderness and no swelling.  Pelvis stable, no ttp    Neurological: He is alert. He has normal strength. No sensory deficit. He exhibits normal muscle tone. GCS eye subscore is 4. GCS verbal subscore is 5. GCS motor subscore is 6.  Able to move all extremities, sensation intact throughout  Skin: He is not diaphoretic.  Psychiatric: He has a normal mood and affect. His speech is normal and behavior is normal.  Nursing note and vitals reviewed.    ED Treatments / Results  Labs (all labs ordered are listed, but only abnormal results are displayed) Labs Reviewed  CBC - Abnormal; Notable for the following components:      Result Value   RBC 4.00 (*)    Hemoglobin 12.4 (*)    HCT 36.0 (*)    Platelets 142 (*)    All other components within normal limits  BASIC METABOLIC PANEL - Abnormal; Notable for the following components:   Chloride 100 (*)    Glucose, Bld 174 (*)    All other components within normal limits  PROTIME-INR - Abnormal; Notable for the following components:   Prothrombin Time 23.6 (*)    All other components within normal limits  CBG MONITORING, ED - Abnormal; Notable for the following components:   Glucose-Capillary 198 (*)    All other components within normal limits    EKG None  Radiology Ct Head Wo Contrast  Result Date: 09/29/2017 CLINICAL DATA:  [place].  Imbalance. EXAM: CT HEAD WITHOUT CONTRAST TECHNIQUE: Contiguous axial images were obtained from the base of the skull through the vertex without intravenous contrast. COMPARISON:  None. FINDINGS: Brain: No evidence for acute infarction, hemorrhage, mass lesion, hydrocephalus, or extra-axial fluid. Atrophy and small vessel disease, not unexpected for age. Vascular: Calcification of the cavernous internal carotid arteries consistent with cerebrovascular atherosclerotic disease. No signs of intracranial large vessel occlusion. Skull: Calvarium intact.  No worrisome osseous lesion. Sinuses/Orbits: No layering sinus fluid. BILATERAL cataract extraction. Other: None.  IMPRESSION: Atrophy and small vessel disease.  No acute intracranial findings. No skull fracture or intracranial hemorrhage. Electronically Signed   By: Staci Righter M.D.   On: 09/29/2017 16:31    Procedures .Marland KitchenLaceration Repair Date/Time: 09/29/2017 5:03 PM Performed by: Dorie Rank, MD Authorized by: Dorie Rank, MD   Consent:    Consent obtained:  Verbal   Consent given by:  Patient   Risks discussed:  Infection, need for additional repair, pain, poor cosmetic result and poor wound healing   Alternatives discussed:  No treatment and delayed treatment Universal protocol:    Procedure explained and questions answered to patient or proxy's satisfaction: yes     Relevant documents present and verified: yes     Test results available and properly labeled: yes     Imaging studies available: yes     Required blood products, implants, devices, and special equipment available: yes     Site/side marked: yes     Immediately prior to procedure, a time out was called: yes     Patient identity confirmed:  Verbally with patient Anesthesia (see MAR for exact dosages):    Anesthesia method:  Local infiltration   Local anesthetic:  Lidocaine 1% WITH epi Laceration details:    Location:  Scalp   Scalp location:  Occipital   Length (cm):  1 Repair type:    Repair type:  Simple Pre-procedure details:    Preparation:  Patient was prepped and draped in usual sterile fashion Exploration:    Hemostasis achieved with:  Direct pressure   Wound exploration: entire depth of wound probed and visualized     Wound extent: no foreign bodies/material noted and no underlying fracture noted     Contaminated: no   Treatment:    Area cleansed with:  Saline   Amount of cleaning:  Standard   Irrigation method:  Pressure wash   Visualized foreign bodies/material removed: no   Skin repair:    Repair method:  Staples   Number of staples:  2 Approximation:    Approximation:  Close Post-procedure details:     Dressing:  Open (no dressing)   Patient tolerance of procedure:  Tolerated well, no immediate complications   (including critical care time)  Medications Ordered in ED Medications  lidocaine-EPINEPHrine (XYLOCAINE W/EPI) 2 %-1:200000 (PF) injection 10 mL (has no administration in time range)  Tdap (BOOSTRIX) injection 0.5 mL (has no administration in time range)     Initial Impression / Assessment and Plan / ED Course  I have reviewed the triage vital signs and the nursing notes.  Pertinent labs & imaging results that were available during my care of the patient were reviewed by me and considered in my medical decision making (see chart for details).  Clinical Course as of Sep 29 1701  Wed Sep 29, 2017  1703 Labs are unremarkable.  CT scan without any acute findings   [JK]    Clinical Course User Index [JK] Dorie Rank, MD  No signs of any serious injury associated with the fall.  Laceration was repaired with staples.  Patient is stable for discharge.   Final Clinical Impressions(s) / ED Diagnoses   Final diagnoses:  Fall, initial encounter    ED Discharge Orders    None       Dorie Rank, MD 09/29/17 1704

## 2017-09-29 NOTE — ED Notes (Signed)
Family reports patient had Tdap on 09/24/2016.

## 2017-09-29 NOTE — Discharge Instructions (Addendum)
Clotrimazole cream and zinc oxide cream for the rash on the buttocks.  Staple removal in 7 days

## 2017-09-29 NOTE — ED Notes (Signed)
Have notified tech to do EKG

## 2017-09-29 NOTE — ED Triage Notes (Signed)
Pt tripped and fell. Pt had skin lac to back of head, and lac to left elbow.  Pt unsure if  he passed out but states he didn't know anything for about 2 seconds. Pt a/o at this time. Pt on warfarin

## 2017-09-29 NOTE — ED Notes (Signed)
Patient back from CT.

## 2017-09-30 DIAGNOSIS — H35341 Macular cyst, hole, or pseudohole, right eye: Secondary | ICD-10-CM | POA: Diagnosis not present

## 2017-09-30 DIAGNOSIS — H35371 Puckering of macula, right eye: Secondary | ICD-10-CM | POA: Diagnosis not present

## 2017-09-30 DIAGNOSIS — H43393 Other vitreous opacities, bilateral: Secondary | ICD-10-CM | POA: Diagnosis not present

## 2017-10-01 DIAGNOSIS — D519 Vitamin B12 deficiency anemia, unspecified: Secondary | ICD-10-CM | POA: Diagnosis not present

## 2017-10-01 DIAGNOSIS — I70203 Unspecified atherosclerosis of native arteries of extremities, bilateral legs: Secondary | ICD-10-CM | POA: Diagnosis not present

## 2017-10-01 DIAGNOSIS — M86171 Other acute osteomyelitis, right ankle and foot: Secondary | ICD-10-CM | POA: Diagnosis not present

## 2017-10-01 DIAGNOSIS — Z4781 Encounter for orthopedic aftercare following surgical amputation: Secondary | ICD-10-CM | POA: Diagnosis not present

## 2017-10-01 DIAGNOSIS — E1151 Type 2 diabetes mellitus with diabetic peripheral angiopathy without gangrene: Secondary | ICD-10-CM | POA: Diagnosis not present

## 2017-10-01 DIAGNOSIS — I482 Chronic atrial fibrillation: Secondary | ICD-10-CM | POA: Diagnosis not present

## 2017-10-04 ENCOUNTER — Telehealth: Payer: Self-pay | Admitting: *Deleted

## 2017-10-04 ENCOUNTER — Ambulatory Visit (INDEPENDENT_AMBULATORY_CARE_PROVIDER_SITE_OTHER): Payer: Medicare Other | Admitting: *Deleted

## 2017-10-04 ENCOUNTER — Ambulatory Visit (INDEPENDENT_AMBULATORY_CARE_PROVIDER_SITE_OTHER): Payer: Medicare Other | Admitting: Internal Medicine

## 2017-10-04 DIAGNOSIS — Z5181 Encounter for therapeutic drug level monitoring: Secondary | ICD-10-CM | POA: Diagnosis not present

## 2017-10-04 DIAGNOSIS — I70203 Unspecified atherosclerosis of native arteries of extremities, bilateral legs: Secondary | ICD-10-CM | POA: Diagnosis not present

## 2017-10-04 DIAGNOSIS — I482 Chronic atrial fibrillation: Secondary | ICD-10-CM | POA: Diagnosis not present

## 2017-10-04 DIAGNOSIS — D519 Vitamin B12 deficiency anemia, unspecified: Secondary | ICD-10-CM | POA: Diagnosis not present

## 2017-10-04 DIAGNOSIS — M86171 Other acute osteomyelitis, right ankle and foot: Secondary | ICD-10-CM | POA: Diagnosis not present

## 2017-10-04 DIAGNOSIS — E1151 Type 2 diabetes mellitus with diabetic peripheral angiopathy without gangrene: Secondary | ICD-10-CM | POA: Diagnosis not present

## 2017-10-04 DIAGNOSIS — Z4781 Encounter for orthopedic aftercare following surgical amputation: Secondary | ICD-10-CM | POA: Diagnosis not present

## 2017-10-04 DIAGNOSIS — I4891 Unspecified atrial fibrillation: Secondary | ICD-10-CM

## 2017-10-04 LAB — POCT INR: INR: 2.1 (ref 2.0–3.0)

## 2017-10-04 NOTE — Telephone Encounter (Signed)
Done.  See coumadin note. 

## 2017-10-04 NOTE — Patient Instructions (Signed)
Continue coumadin 2 tablets daily except 1 tablet on Fridays.   Recheck in 2 week.  S/P amputation of toe 08/27/17.  Order given to [place]

## 2017-10-04 NOTE — Telephone Encounter (Signed)
[facility]LL[facility] W/ [facility] [phone] LEAVE VOICEMAIL   PT 25.2 INR 2.1

## 2017-10-05 ENCOUNTER — Other Ambulatory Visit: Payer: Self-pay

## 2017-10-05 ENCOUNTER — Encounter (HOSPITAL_COMMUNITY): Payer: Self-pay | Admitting: Hematology

## 2017-10-05 ENCOUNTER — Inpatient Hospital Stay (HOSPITAL_COMMUNITY): Payer: Medicare Other | Attending: Hematology

## 2017-10-05 ENCOUNTER — Inpatient Hospital Stay (HOSPITAL_COMMUNITY): Payer: Medicare Other | Attending: Hematology | Admitting: Hematology

## 2017-10-05 ENCOUNTER — Encounter (HOSPITAL_COMMUNITY): Payer: Self-pay | Admitting: Lab

## 2017-10-05 ENCOUNTER — Inpatient Hospital Stay (HOSPITAL_COMMUNITY): Payer: Medicare Other

## 2017-10-05 VITALS — BP 137/87 | HR 92 | Temp 98.4°F | Resp 18 | Wt 161.0 lb

## 2017-10-05 DIAGNOSIS — C61 Malignant neoplasm of prostate: Secondary | ICD-10-CM

## 2017-10-05 DIAGNOSIS — K219 Gastro-esophageal reflux disease without esophagitis: Secondary | ICD-10-CM | POA: Diagnosis not present

## 2017-10-05 DIAGNOSIS — R42 Dizziness and giddiness: Secondary | ICD-10-CM

## 2017-10-05 DIAGNOSIS — R197 Diarrhea, unspecified: Secondary | ICD-10-CM | POA: Diagnosis not present

## 2017-10-05 DIAGNOSIS — Z7984 Long term (current) use of oral hypoglycemic drugs: Secondary | ICD-10-CM | POA: Diagnosis not present

## 2017-10-05 DIAGNOSIS — Z9181 History of falling: Secondary | ICD-10-CM | POA: Diagnosis not present

## 2017-10-05 DIAGNOSIS — Z7952 Long term (current) use of systemic steroids: Secondary | ICD-10-CM | POA: Diagnosis not present

## 2017-10-05 DIAGNOSIS — F329 Major depressive disorder, single episode, unspecified: Secondary | ICD-10-CM

## 2017-10-05 DIAGNOSIS — I10 Essential (primary) hypertension: Secondary | ICD-10-CM

## 2017-10-05 DIAGNOSIS — I739 Peripheral vascular disease, unspecified: Secondary | ICD-10-CM

## 2017-10-05 DIAGNOSIS — Z79899 Other long term (current) drug therapy: Secondary | ICD-10-CM | POA: Insufficient documentation

## 2017-10-05 DIAGNOSIS — Z85828 Personal history of other malignant neoplasm of skin: Secondary | ICD-10-CM | POA: Diagnosis not present

## 2017-10-05 DIAGNOSIS — L988 Other specified disorders of the skin and subcutaneous tissue: Secondary | ICD-10-CM

## 2017-10-05 DIAGNOSIS — D472 Monoclonal gammopathy: Secondary | ICD-10-CM | POA: Diagnosis not present

## 2017-10-05 DIAGNOSIS — R630 Anorexia: Secondary | ICD-10-CM

## 2017-10-05 DIAGNOSIS — R5383 Other fatigue: Secondary | ICD-10-CM | POA: Diagnosis not present

## 2017-10-05 DIAGNOSIS — I4891 Unspecified atrial fibrillation: Secondary | ICD-10-CM

## 2017-10-05 DIAGNOSIS — Z8500 Personal history of malignant neoplasm of unspecified digestive organ: Secondary | ICD-10-CM | POA: Diagnosis not present

## 2017-10-05 DIAGNOSIS — C7951 Secondary malignant neoplasm of bone: Secondary | ICD-10-CM | POA: Diagnosis not present

## 2017-10-05 DIAGNOSIS — Z7901 Long term (current) use of anticoagulants: Secondary | ICD-10-CM

## 2017-10-05 LAB — CBC
HCT: 38 % — ABNORMAL LOW (ref 39.0–52.0)
Hemoglobin: 13.2 g/dL (ref 13.0–17.0)
MCH: 31.3 pg (ref 26.0–34.0)
MCHC: 34.7 g/dL (ref 30.0–36.0)
MCV: 90 fL (ref 78.0–100.0)
Platelets: 169 10*3/uL (ref 150–400)
RBC: 4.22 MIL/uL (ref 4.22–5.81)
RDW: 13.1 % (ref 11.5–15.5)
WBC: 5.7 10*3/uL (ref 4.0–10.5)

## 2017-10-05 LAB — COMPREHENSIVE METABOLIC PANEL
ALT: 11 U/L — ABNORMAL LOW (ref 17–63)
AST: 16 U/L (ref 15–41)
Albumin: 3.6 g/dL (ref 3.5–5.0)
Alkaline Phosphatase: 68 U/L (ref 38–126)
Anion gap: 9 (ref 5–15)
BUN: 12 mg/dL (ref 6–20)
CO2: 27 mmol/L (ref 22–32)
Calcium: 9.5 mg/dL (ref 8.9–10.3)
Chloride: 99 mmol/L — ABNORMAL LOW (ref 101–111)
Creatinine, Ser: 0.65 mg/dL (ref 0.61–1.24)
GFR calc Af Amer: >60 mL/min (ref >60)
GFR calc non Af Amer: >60 mL/min (ref >60)
Glucose, Bld: 234 mg/dL — ABNORMAL HIGH (ref 65–99)
Potassium: 3.4 mmol/L — ABNORMAL LOW (ref 3.5–5.1)
Sodium: 135 mmol/L (ref 135–145)
Total Bilirubin: 0.7 mg/dL (ref 0.3–1.2)
Total Protein: 6.6 g/dL (ref 6.5–8.1)

## 2017-10-05 LAB — PSA: Prostatic Specific Antigen: <0.01 ng/mL (ref 0.00–4.00)

## 2017-10-05 MED ORDER — DENOSUMAB 120 MG/1.7ML ~~LOC~~ SOLN
120.0000 mg | Freq: Once | SUBCUTANEOUS | Status: AC
  Administered 2017-10-05: 120 mg via SUBCUTANEOUS
  Filled 2017-10-05: qty 1.7

## 2017-10-05 NOTE — Progress Notes (Signed)
[name], [place] [address]   CLINIC:  Medical Oncology/Hematology  PCP:  Rosita Fire, MD [place] [address] [phone]   REASON FOR VISIT:  Follow-up for metastatic prostate cancer to the bones.  CURRENT THERAPY: Abiraterone and prednisone.  BRIEF ONCOLOGIC HISTORY:    GIST (gastrointestinal stromal tumor), malignant ([place])   08/18/2011 Initial Diagnosis    GIST (gastrointestinal stromal tumor), malignant ([place])      02/08/2017 Genetic Testing    ATM Gain (Exons 62-63) VUS identified on the multi-gene panel.  The Multi-Gene Panel offered by [facility] includes sequencing and/or deletion duplication testing of the following 80 genes: ALK, APC, ATM, AXIN2,BAP1,  BARD1, BLM, BMPR1A, BRCA1, BRCA2, BRIP1, CASR, CDC73, CDH1, CDK4, CDKN1B, CDKN1C, CDKN2A (p14ARF), CDKN2A (p16INK4a), CEBPA, CHEK2, CTNNA1, DICER1, DIS3L2, EGFR (c.2369C>T, p.Thr790Met variant only), EPCAM (Deletion/duplication testing only), FH, FLCN, GATA2, GPC3, GREM1 (Promoter region deletion/duplication testing only), HOXB13 (c.251G>A, p.Gly84Glu), HRAS, KIT, MAX, MEN1, MET, MITF (c.952G>A, p.Glu318Lys variant only), MLH1, MSH2, MSH3, MSH6, MUTYH, NBN, NF1, NF2, NTHL1, PALB2, PDGFRA, PHOX2B, PMS2, POLD1, POLE, POT1, PRKAR1A, PTCH1, PTEN, RAD50, RAD51C, RAD51D, RB1, RECQL4, RET, RUNX1, SDHAF2, SDHA (sequence changes only), SDHB, SDHC, SDHD, SMAD4, SMARCA4, SMARCB1, SMARCE1, STK11, SUFU, TERT, TERT, TMEM127, TP53, TSC1, TSC2, VHL, WRN and WT1.  The report date is February 08, 2017.        Prostate cancer ([place])   09/08/2013 Initial Diagnosis    Prostate cancer ([place])      02/08/2017 Genetic Testing    ATM Gain (Exons 62-63) VUS identified on the multi-gene panel.  The Multi-Gene Panel offered by [facility] includes sequencing and/or deletion duplication testing of the following 80 genes: ALK, APC, ATM, AXIN2,BAP1,  BARD1, BLM, BMPR1A, BRCA1, BRCA2, BRIP1, CASR,  CDC73, CDH1, CDK4, CDKN1B, CDKN1C, CDKN2A (p14ARF), CDKN2A (p16INK4a), CEBPA, CHEK2, CTNNA1, DICER1, DIS3L2, EGFR (c.2369C>T, p.Thr790Met variant only), EPCAM (Deletion/duplication testing only), FH, FLCN, GATA2, GPC3, GREM1 (Promoter region deletion/duplication testing only), HOXB13 (c.251G>A, p.Gly84Glu), HRAS, KIT, MAX, MEN1, MET, MITF (c.952G>A, p.Glu318Lys variant only), MLH1, MSH2, MSH3, MSH6, MUTYH, NBN, NF1, NF2, NTHL1, PALB2, PDGFRA, PHOX2B, PMS2, POLD1, POLE, POT1, PRKAR1A, PTCH1, PTEN, RAD50, RAD51C, RAD51D, RB1, RECQL4, RET, RUNX1, SDHAF2, SDHA (sequence changes only), SDHB, SDHC, SDHD, SMAD4, SMARCA4, SMARCB1, SMARCE1, STK11, SUFU, TERT, TERT, TMEM127, TP53, TSC1, TSC2, VHL, WRN and WT1.  The report date is February 08, 2017.         CANCER STAGING: Cancer Staging NHL (non-Hodgkin's lymphoma) (HCC) Staging form: Lymphoid Neoplasms, AJCC 6th Edition - Clinical: Stage III - Signed by Baird Cancer, PA on 08/18/2011    INTERVAL HISTORY:  [name] 84 y.o. male returns for follow-up along with his daughter.  He reportedly fell couple of times at home due to balance problems.  He had skin breakdown particularly on the left forearm.  He was reportedly started on mirtazapine 15 mg at bedtime for depression and loss of appetite.  Both of them improved after start of the pill.  His blood sugars are better controlled since we cut back on prednisone to 2.5 mg twice daily.  Denies any infections.  Denies any major hot flashes.  He had an episode of diarrhea which resolved.  Fatigue is stable.    REVIEW OF SYSTEMS:  Review of Systems  Constitutional: Positive for fatigue.  Gastrointestinal: Positive for diarrhea.  Neurological: Positive for dizziness.  Hematological: Bruises/bleeds easily.  All other systems reviewed and are negative.    PAST MEDICAL/SURGICAL HISTORY:  Past Medical History:  Diagnosis Date  . Acute bronchitis   . Arthritis   . Atrial fibrillation ([place])    Dr.  Harl Bowie- [facility] follows saw 11'14  . Bladder stones    tx. with oral meds and antibiotics, now surgery planned  . Cataracts, both eyes    surgery planned May 2015  . Diabetes mellitus without complication ([place])    Type II  . Dyspnea    with activity  . Dysrhythmia    Afib  . Family history of breast cancer   . Family history of colon cancer   . GERD (gastroesophageal reflux disease)   . 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 Margot Chimes, thus far without recurrence.   . Hypertension   . NHL (non-Hodgkin's lymphoma) ([place]) 2005   Diffuse large B-cell lymphoma, clinically stage IIIA, CD20 positive, status post cervical lymph node biopsy 08/03/2003 on the left. PET scan was also positive in the spleen and small bowel region, but bone marrow aspiration and biopsy were negative. So he essentially had stage IIIAs. He received R-CHOP x6 cycles with CR established by PET scan criteria on 11/23/2003 with no evidence for relapse th  . PAD (peripheral artery disease) ([place]) 08/27/2017  . Skin cancer    Basal cell- face,head, neck,  arms, legs, Back   Past Surgical History:  Procedure Laterality Date  . AMPUTATION Left 06/29/2017   Procedure: AMPUTATION LEFT GREAT TOE;  Surgeon: [name], MD;  Location: [facility];  Service: Vascular;  Laterality: Left;  . AMPUTATION Left 08/27/2017   Procedure: AMPUTATION Left SECOND TOE;  Surgeon: [name], MD;  Location: [facility];  Service: Vascular;  Laterality: Left;  . CATARACT EXTRACTION Bilateral 2015  . CHOLECYSTECTOMY    . COLON RESECTION     small bowel  . CYSTOSCOPY WITH LITHOLAPAXY N/A 08/10/2013   Procedure: CYSTOSCOPY WITH LITHOLAPAXY WITH [name];  Surgeon: [name], MD;  [facility];   Service: Urology;  Laterality: N/A;  . ESOPHAGEAL DILATION N/A 02/27/2016   Procedure: ESOPHAGEAL DILATION;  Surgeon: [name], MD;  [facility];  Service: Endoscopy;  Laterality: N/A;  . ESOPHAGOGASTRODUODENOSCOPY N/A 02/27/2016   Procedure: ESOPHAGOGASTRODUODENOSCOPY (EGD);  Surgeon: [name], MD;  [facility];  Service: Endoscopy;  Laterality: N/A;  12:00  . INCISION AND DRAINAGE PERIRECTAL ABSCESS    . LOWER EXTREMITY ANGIOGRAPHY N/A 06/25/2017   Procedure: LOWER EXTREMITY ANGIOGRAPHY;  Surgeon: [name], MD;  Location: [facility];  Service: Cardiovascular;  Laterality: N/A;  . LYMPH NODE DISSECTION Left    '05-neck  . PORT-A-CATH REMOVAL    . PORTACATH PLACEMENT     insertion and removal -last chemotherapy 10 yrs ago  . TRANSURETHRAL RESECTION OF PROSTATE N/A 08/10/2013   Procedure: TRANSURETHRAL RESECTION OF THE PROSTATE WITH GYRUS INSTRUMENTS;  Surgeon: [name], MD;  [facility];  Service: Urology;  Laterality: N/A;     SOCIAL HISTORY:  Social History   Socioeconomic History  . Marital status: Married    Spouse name: Not on file  . Number of children: Not on file  . Years of education: Not on file  . Highest education level: Not on file  Occupational History  . Occupation: retired  Employer: RETIRED    Comment: salesman  Social Needs  . Financial resource strain: Not on file  . Food insecurity:    Worry: Not on file    Inability: Not on file  . Transportation needs:    Medical: Not on file    Non-medical: Not on file  Tobacco Use  . Smoking status: Never Smoker  . Smokeless tobacco: Never Used  Substance and Sexual Activity  . Alcohol use: No  . Drug use: No  . Sexual activity: Not Currently  Lifestyle  . Physical activity:    Days per week: Not on file    Minutes per session: Not on file  . Stress: Not on file  Relationships  . Social connections:    Talks on phone: Not on file    Gets  together: Not on file    Attends religious service: Not on file    Active member of club or organization: Not on file    Attends meetings of clubs or organizations: Not on file    Relationship status: Not on file  . Intimate partner violence:    Fear of current or ex partner: Not on file    Emotionally abused: Not on file    Physically abused: Not on file    Forced sexual activity: Not on file  Other Topics Concern  . Not on file  Social History Narrative  . Not on file    FAMILY HISTORY:  Family History  Problem Relation Age of Onset  . Heart attack Mother        died in her 11s  . Other Father 73       pneumonia - died  . Breast cancer Sister        dx in her 33s-60s  . Colon cancer Brother        dx in his 7s  . Cancer Sister        TAH/BSO due to "male cancer"  . Breast cancer Daughter 41  . Breast cancer Daughter 41       DCIS - ATM VUS on [facility] 83 gene panel in 2018  . Breast cancer Daughter 68       DCIS - Negative on [facility] MyRisk 25 gene apnel in 2015  . Thyroid cancer Daughter 57       Medullary and Papillary  . Thyroid nodules Grandchild     CURRENT MEDICATIONS:  Outpatient Encounter Medications as of 10/05/2017  Medication Sig Note  . abiraterone acetate (ZYTIGA) 250 MG tablet Take 4 tablets (1,000 mg total) by mouth daily. Take on an empty stomach 1 hour before or 2 hours after a meal   . acetaminophen (TYLENOL) 500 MG tablet Take 500-1,000 mg by mouth daily as needed for mild pain, fever or headache.   Marland Kitchen amitriptyline (ELAVIL) 25 MG tablet Take 25 mg by mouth at bedtime.   Marland Kitchen CALCIUM CARB-CHOLECALCIFEROL PO Take 1 tablet by mouth daily.  10/05/2017: 600 mg/800 mg   . clotrimazole-betamethasone (LOTRISONE) cream Apply 1 application topically daily as needed (for fungus).    . CRANBERRY PO Take 4,200 mg by mouth daily.   . cyanocobalamin (,VITAMIN B-12,) 1000 MCG/ML injection Inject 1,000 mcg every 30 (thirty) days into the skin.   Marland Kitchen denosumab (XGEVA)  120 MG/1.7ML SOLN injection Inject 120 mg into the skin every 30 (thirty) days.    Marland Kitchen diltiazem (CARDIZEM) 90 MG tablet TAKE 90 MG BY MOUTH TWICE DAILY   . docusate sodium (COLACE) 100  MG capsule Take 100 mg by mouth daily as needed for mild constipation.   . furosemide (LASIX) 20 MG tablet Take 1 tablet (20 mg total) by mouth daily.   Marland Kitchen glipiZIDE (GLUCOTROL XL) 5 MG 24 hr tablet Take 5 mg by mouth daily with breakfast.    . Leuprolide Acetate, 6 Month, (LUPRON DEPOT) 45 MG injection Inject 45 mg into the muscle every 6 (six) months.   Marland Kitchen lisinopril (PRINIVIL,ZESTRIL) 20 MG tablet Take 1 tablet (20 mg total) daily by mouth.   . loperamide (IMODIUM) 2 MG capsule Take 2 mg by mouth as needed for diarrhea or loose stools.   . metFORMIN (GLUCOPHAGE) 850 MG tablet Take 500 mg by mouth 2 (two) times daily with a meal.  06/28/2017: On hold as of 06/24/2017  . mirtazapine (REMERON) 15 MG tablet Take 15 mg by mouth at bedtime.   Marland Kitchen nystatin (MYCOSTATIN) powder Apply 1 g topically 4 (four) times daily as needed (fungus).    Marland Kitchen omeprazole (PRILOSEC) 20 MG capsule Take 40 mg by mouth daily.    . phenylephrine-shark liver oil-mineral oil-petrolatum (PREPARATION H) 0.25-3-14-71.9 % rectal ointment Place 1 application rectally 2 (two) times daily as needed (for chaffing).   . potassium chloride (K-DUR,KLOR-CON) 10 MEQ tablet Take 2 tablets (20 mEq total) by mouth daily.   . predniSONE (DELTASONE) 5 MG tablet Take 1 tablet (5 mg total) by mouth 2 (two) times daily with a meal. (Patient taking differently: Take 2.5 mg by mouth 2 (two) times daily with a meal. )   . sennosides-docusate sodium (SENOKOT-S) 8.6-50 MG tablet Take 1 tablet by mouth daily as needed for constipation.    . VENTOLIN HFA 108 (90 Base) MCG/ACT inhaler Inhale 1-2 puffs into the lungs every 6 (six) hours as needed for wheezing or shortness of breath.    . warfarin (COUMADIN) 2.5 MG tablet Take 2 tablets daily except 1 tablet on Tuesdays and Fridays or  as directed (Patient taking differently: Take 2.5-5 mg by mouth See admin instructions. Take 2 tablets daily except 1 tablet on  Fridays or as directed)   . diphenhydrAMINE (BENADRYL) 25 MG tablet Take 25 mg by mouth every 6 (six) hours as needed for itching.    Facility-Administered Encounter Medications as of 10/05/2017  Medication  . denosumab (XGEVA) injection 120 mg    ALLERGIES:  Allergies  Allergen Reactions  . Augmentin [Amoxicillin-Pot Clavulanate] Rash    Redness of skin     PHYSICAL EXAM:  ECOG Performance status: 1  Vitals:   10/05/17 1355  BP: 137/87  Pulse: 92  Resp: 18  Temp: 98.4 F (36.9 C)  SpO2: 97%   Filed Weights   10/05/17 1355  Weight: 161 lb (73 kg)    Physical Exam Deferred.  LABORATORY DATA:  I have reviewed the labs as listed.  CBC    Component Value Date/Time   WBC 5.7 10/05/2017 1314   RBC 4.22 10/05/2017 1314   HGB 13.2 10/05/2017 1314   HCT 38.0 (L) 10/05/2017 1314   PLT 169 10/05/2017 1314   MCV 90.0 10/05/2017 1314   MCH 31.3 10/05/2017 1314   MCHC 34.7 10/05/2017 1314   RDW 13.1 10/05/2017 1314   LYMPHSABS 2.8 09/06/2017 1234   MONOABS 1.0 09/06/2017 1234   EOSABS 0.1 09/06/2017 1234   BASOSABS 0.1 09/06/2017 1234   CMP Latest Ref Rng & Units 10/05/2017 09/29/2017 09/06/2017  Glucose 65 - 99 mg/dL 234(H) 174(H) 173(H)  BUN 6 - 20 mg/dL  12 16 16   Creatinine 0.61 - 1.24 mg/dL 0.65 0.76 0.73  Sodium 135 - 145 mmol/L 135 137 139  Potassium 3.5 - 5.1 mmol/L 3.4(L) 3.6 4.0  Chloride 101 - 111 mmol/L 99(L) 100(L) 102  CO2 22 - 32 mmol/L 27 27 28   Calcium 8.9 - 10.3 mg/dL 9.5 9.8 10.4(H)  Total Protein 6.5 - 8.1 g/dL 6.6 - 6.9  Total Bilirubin 0.3 - 1.2 mg/dL 0.7 - 0.7  Alkaline Phos 38 - 126 U/L 68 - 80  AST 15 - 41 U/L 16 - 17  ALT 17 - 63 U/L 11(L) - 13(L)          ASSESSMENT & PLAN:   Prostate cancer ([place]) 1.  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 -Started on Abiraterone and  prednisone in October 2016, tolerating very well.  He is on every 25-monthLupron injections.  Last Lupron injection was on 09/06/2017.  Last PSA was down to 0.01 on the same day.  Last bone scan dated 08/02/2017  shows interval improvement in T12 lesion.  No new lesions were seen.  CT scan on the same day showed some pancreatic calcifications but did not show any new lesions.  As he is having difficulty managing his blood sugars, I have cut back on prednisone to 2.5 mg twice daily at last visit in April.  His blood sugars are better at this time.  His appetite has also picked up since he started mirtazapine 15 mg at bedtime.  His depression improved.  However he had couple of falls due to balance issues. -he will come back in 3 months for follow-up with repeat PSA.  2.  Bone metastasis: We will continue denosumab monthly injections.  3.  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  4.  DL BCL: He was originally diagnosed in 2005.  He does not have any symptoms or lymphadenopathy on the scan.  5.  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  No evidence of recurrence.      Orders placed this encounter:  Orders Placed This Encounter  Procedures  . CBC with Differential ([facility])  . CMP ([facility])  . PSA      SDerek Jack MD [phone]

## 2017-10-05 NOTE — Progress Notes (Signed)
xgeva given per orders. Patient tolerated it well without problems. Vitals stable and discharged home from clinic ambulatory with walker. Follow up as scheduled.

## 2017-10-05 NOTE — Patient Instructions (Signed)
[facility] at [facility] Discharge Instructions    Exam and discussion today with Dr. Delton Coombes. Xgeva adminstration today. Return as scheduled for lab work and injections. Return as scheduled for follow-up visit.   Thank you for choosing [facility] at [facility] to provide your oncology and hematology care.  To afford each patient quality time with our provider, please arrive at least 15 minutes before your scheduled appointment time.   If you have a lab appointment with the [facility] please come in thru the  Main Entrance and check in at the main information desk  You need to re-schedule your appointment should you arrive 10 or more minutes late.  We strive to give you quality time with our providers, and arriving late affects you and other patients whose appointments are after yours.  Also, if you no show three or more times for appointments you may be dismissed from the clinic at the providers discretion.     Again, thank you for choosing [facility]  Our hope is that these requests will decrease the amount of time that you wait before being seen by our physicians.       _____________________________________________________________  Should you have questions after your visit to [facility], please contact our office at [phone] between the hours of 8:30 a.m. and 4:30 p.m.  Voicemails left after 4:30 p.m. will not be returned until the following business day.  For prescription refill requests, have your pharmacy contact our office.       Resources For Cancer Patients and their Caregivers ? American Cancer Society: Can assist with transportation, wigs, general needs, runs Look Good Feel Better.        938-402-0956 ? Cancer Care: Provides financial assistance, online support groups, medication/co-pay assistance.  1-800-813-HOPE 806-555-8499) ? [place] Assists [place]  cancer patients and their families through emotional , educational and financial support.  732-029-8261 ? [place] DSS Where to apply for food stamps, Medicaid and utility assistance. 810-692-3056 ? [facility]: Transportation to medical appointments. (850)506-9569 ? Social Security Administration: May apply for disability if have a Stage IV cancer. 951-813-7485 570-119-6867 ? [employer], Disability and Transit Services: Assists with nutrition, care and transit needs. [place] Support Programs:   > Cancer Support Group  2nd Tuesday of the month 1pm-2pm, Journey Room   > Creative Journey  3rd Tuesday of the month 1130am-1pm, Journey Room

## 2017-10-05 NOTE — Progress Notes (Unsigned)
Referral sent to Dr Denna Haggard 11/25/17 @ 830

## 2017-10-05 NOTE — Assessment & Plan Note (Signed)
1.  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 -Started on Abiraterone and prednisone in October 2016, tolerating very well.  He is on every 50-month Lupron injections.  Last Lupron injection was on 09/06/2017.  Last PSA was down to 0.01 on the same day.  Last bone scan dated 08/02/2017  shows interval improvement in T12 lesion.  No new lesions were seen.  CT scan on the same day showed some pancreatic calcifications but did not show any new lesions.  As he is having difficulty managing his blood sugars, I have cut back on prednisone to 2.5 mg twice daily at last visit in April.  His blood sugars are better at this time.  His appetite has also picked up since he started mirtazapine 15 mg at bedtime.  His depression improved.  However he had couple of falls due to balance issues. -he will come back in 3 months for follow-up with repeat PSA.  2.  Bone metastasis: We will continue denosumab monthly injections.  3.  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  4.  DL BCL: He was originally diagnosed in 2005.  He does not have any symptoms or lymphadenopathy on the scan.  5.  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  No evidence of recurrence.

## 2017-10-06 DIAGNOSIS — I70203 Unspecified atherosclerosis of native arteries of extremities, bilateral legs: Secondary | ICD-10-CM | POA: Diagnosis not present

## 2017-10-06 DIAGNOSIS — Z4781 Encounter for orthopedic aftercare following surgical amputation: Secondary | ICD-10-CM | POA: Diagnosis not present

## 2017-10-06 DIAGNOSIS — D519 Vitamin B12 deficiency anemia, unspecified: Secondary | ICD-10-CM | POA: Diagnosis not present

## 2017-10-06 DIAGNOSIS — I482 Chronic atrial fibrillation: Secondary | ICD-10-CM | POA: Diagnosis not present

## 2017-10-06 DIAGNOSIS — M86171 Other acute osteomyelitis, right ankle and foot: Secondary | ICD-10-CM | POA: Diagnosis not present

## 2017-10-06 DIAGNOSIS — E1151 Type 2 diabetes mellitus with diabetic peripheral angiopathy without gangrene: Secondary | ICD-10-CM | POA: Diagnosis not present

## 2017-10-07 ENCOUNTER — Encounter: Payer: Medicare Other | Admitting: Vascular Surgery

## 2017-10-07 DIAGNOSIS — I70203 Unspecified atherosclerosis of native arteries of extremities, bilateral legs: Secondary | ICD-10-CM | POA: Diagnosis not present

## 2017-10-07 DIAGNOSIS — E1151 Type 2 diabetes mellitus with diabetic peripheral angiopathy without gangrene: Secondary | ICD-10-CM | POA: Diagnosis not present

## 2017-10-07 DIAGNOSIS — I482 Chronic atrial fibrillation: Secondary | ICD-10-CM | POA: Diagnosis not present

## 2017-10-07 DIAGNOSIS — M86171 Other acute osteomyelitis, right ankle and foot: Secondary | ICD-10-CM | POA: Diagnosis not present

## 2017-10-07 DIAGNOSIS — Z4781 Encounter for orthopedic aftercare following surgical amputation: Secondary | ICD-10-CM | POA: Diagnosis not present

## 2017-10-07 DIAGNOSIS — D519 Vitamin B12 deficiency anemia, unspecified: Secondary | ICD-10-CM | POA: Diagnosis not present

## 2017-10-11 DIAGNOSIS — D519 Vitamin B12 deficiency anemia, unspecified: Secondary | ICD-10-CM | POA: Diagnosis not present

## 2017-10-11 DIAGNOSIS — I482 Chronic atrial fibrillation: Secondary | ICD-10-CM | POA: Diagnosis not present

## 2017-10-11 DIAGNOSIS — I70203 Unspecified atherosclerosis of native arteries of extremities, bilateral legs: Secondary | ICD-10-CM | POA: Diagnosis not present

## 2017-10-11 DIAGNOSIS — E1151 Type 2 diabetes mellitus with diabetic peripheral angiopathy without gangrene: Secondary | ICD-10-CM | POA: Diagnosis not present

## 2017-10-11 DIAGNOSIS — M86171 Other acute osteomyelitis, right ankle and foot: Secondary | ICD-10-CM | POA: Diagnosis not present

## 2017-10-11 DIAGNOSIS — Z4781 Encounter for orthopedic aftercare following surgical amputation: Secondary | ICD-10-CM | POA: Diagnosis not present

## 2017-10-13 ENCOUNTER — Telehealth: Payer: Self-pay

## 2017-10-13 NOTE — Telephone Encounter (Signed)
Patients wife called regarding message left on her phone for appointment. Patients wife states that patient does not need apt and that Dr Oneida Alar has already removed the stitches.

## 2017-10-14 ENCOUNTER — Ambulatory Visit (INDEPENDENT_AMBULATORY_CARE_PROVIDER_SITE_OTHER): Payer: Medicare Other | Admitting: Otolaryngology

## 2017-10-14 DIAGNOSIS — D519 Vitamin B12 deficiency anemia, unspecified: Secondary | ICD-10-CM | POA: Diagnosis not present

## 2017-10-14 DIAGNOSIS — I70203 Unspecified atherosclerosis of native arteries of extremities, bilateral legs: Secondary | ICD-10-CM | POA: Diagnosis not present

## 2017-10-14 DIAGNOSIS — I482 Chronic atrial fibrillation: Secondary | ICD-10-CM | POA: Diagnosis not present

## 2017-10-14 DIAGNOSIS — Z4781 Encounter for orthopedic aftercare following surgical amputation: Secondary | ICD-10-CM | POA: Diagnosis not present

## 2017-10-14 DIAGNOSIS — M86171 Other acute osteomyelitis, right ankle and foot: Secondary | ICD-10-CM | POA: Diagnosis not present

## 2017-10-14 DIAGNOSIS — E1151 Type 2 diabetes mellitus with diabetic peripheral angiopathy without gangrene: Secondary | ICD-10-CM | POA: Diagnosis not present

## 2017-10-15 DIAGNOSIS — I70203 Unspecified atherosclerosis of native arteries of extremities, bilateral legs: Secondary | ICD-10-CM | POA: Diagnosis not present

## 2017-10-15 DIAGNOSIS — I482 Chronic atrial fibrillation: Secondary | ICD-10-CM | POA: Diagnosis not present

## 2017-10-15 DIAGNOSIS — M86171 Other acute osteomyelitis, right ankle and foot: Secondary | ICD-10-CM | POA: Diagnosis not present

## 2017-10-15 DIAGNOSIS — Z4781 Encounter for orthopedic aftercare following surgical amputation: Secondary | ICD-10-CM | POA: Diagnosis not present

## 2017-10-15 DIAGNOSIS — D519 Vitamin B12 deficiency anemia, unspecified: Secondary | ICD-10-CM | POA: Diagnosis not present

## 2017-10-15 DIAGNOSIS — E1151 Type 2 diabetes mellitus with diabetic peripheral angiopathy without gangrene: Secondary | ICD-10-CM | POA: Diagnosis not present

## 2017-10-19 ENCOUNTER — Other Ambulatory Visit (HOSPITAL_COMMUNITY)
Admission: AD | Admit: 2017-10-19 | Discharge: 2017-10-19 | Disposition: A | Discharge status: Home / Self Care | Payer: Medicare Other | Source: Skilled Nursing Facility | Attending: Urology | Admitting: Urology

## 2017-10-19 ENCOUNTER — Ambulatory Visit (INDEPENDENT_AMBULATORY_CARE_PROVIDER_SITE_OTHER): Payer: Medicare Other | Admitting: *Deleted

## 2017-10-19 ENCOUNTER — Ambulatory Visit (INDEPENDENT_AMBULATORY_CARE_PROVIDER_SITE_OTHER): Payer: Medicare Other | Admitting: Urology

## 2017-10-19 DIAGNOSIS — R9721 Rising PSA following treatment for malignant neoplasm of prostate: Secondary | ICD-10-CM

## 2017-10-19 DIAGNOSIS — I4891 Unspecified atrial fibrillation: Secondary | ICD-10-CM

## 2017-10-19 DIAGNOSIS — Z4781 Encounter for orthopedic aftercare following surgical amputation: Secondary | ICD-10-CM | POA: Diagnosis not present

## 2017-10-19 DIAGNOSIS — R825 Elevated urine levels of drugs, medicaments and biological substances: Secondary | ICD-10-CM | POA: Insufficient documentation

## 2017-10-19 DIAGNOSIS — E1151 Type 2 diabetes mellitus with diabetic peripheral angiopathy without gangrene: Secondary | ICD-10-CM | POA: Diagnosis not present

## 2017-10-19 DIAGNOSIS — M86171 Other acute osteomyelitis, right ankle and foot: Secondary | ICD-10-CM | POA: Diagnosis not present

## 2017-10-19 DIAGNOSIS — D519 Vitamin B12 deficiency anemia, unspecified: Secondary | ICD-10-CM | POA: Diagnosis not present

## 2017-10-19 DIAGNOSIS — C7951 Secondary malignant neoplasm of bone: Secondary | ICD-10-CM | POA: Diagnosis not present

## 2017-10-19 DIAGNOSIS — Z5181 Encounter for therapeutic drug level monitoring: Secondary | ICD-10-CM

## 2017-10-19 DIAGNOSIS — I482 Chronic atrial fibrillation: Secondary | ICD-10-CM | POA: Diagnosis not present

## 2017-10-19 DIAGNOSIS — I70203 Unspecified atherosclerosis of native arteries of extremities, bilateral legs: Secondary | ICD-10-CM | POA: Diagnosis not present

## 2017-10-19 DIAGNOSIS — C61 Malignant neoplasm of prostate: Secondary | ICD-10-CM | POA: Diagnosis not present

## 2017-10-19 LAB — POCT INR: INR: 2.1 (ref 2.0–3.0)

## 2017-10-19 NOTE — Patient Instructions (Signed)
Continue coumadin 2 tablets daily except 1 tablet on Fridays.   Recheck in 1 week.  S/P amputation of toe 08/27/17.  Order given to [place]

## 2017-10-20 DIAGNOSIS — M86171 Other acute osteomyelitis, right ankle and foot: Secondary | ICD-10-CM | POA: Diagnosis not present

## 2017-10-20 DIAGNOSIS — I482 Chronic atrial fibrillation: Secondary | ICD-10-CM | POA: Diagnosis not present

## 2017-10-20 DIAGNOSIS — D519 Vitamin B12 deficiency anemia, unspecified: Secondary | ICD-10-CM | POA: Diagnosis not present

## 2017-10-20 DIAGNOSIS — E1151 Type 2 diabetes mellitus with diabetic peripheral angiopathy without gangrene: Secondary | ICD-10-CM | POA: Diagnosis not present

## 2017-10-20 DIAGNOSIS — Z4781 Encounter for orthopedic aftercare following surgical amputation: Secondary | ICD-10-CM | POA: Diagnosis not present

## 2017-10-20 DIAGNOSIS — I70203 Unspecified atherosclerosis of native arteries of extremities, bilateral legs: Secondary | ICD-10-CM | POA: Diagnosis not present

## 2017-10-21 LAB — URINE CULTURE: Culture: >=100000 — AB

## 2017-10-22 DIAGNOSIS — I70203 Unspecified atherosclerosis of native arteries of extremities, bilateral legs: Secondary | ICD-10-CM | POA: Diagnosis not present

## 2017-10-22 DIAGNOSIS — E1151 Type 2 diabetes mellitus with diabetic peripheral angiopathy without gangrene: Secondary | ICD-10-CM | POA: Diagnosis not present

## 2017-10-22 DIAGNOSIS — M86171 Other acute osteomyelitis, right ankle and foot: Secondary | ICD-10-CM | POA: Diagnosis not present

## 2017-10-22 DIAGNOSIS — Z4781 Encounter for orthopedic aftercare following surgical amputation: Secondary | ICD-10-CM | POA: Diagnosis not present

## 2017-10-22 DIAGNOSIS — D519 Vitamin B12 deficiency anemia, unspecified: Secondary | ICD-10-CM | POA: Diagnosis not present

## 2017-10-22 DIAGNOSIS — I482 Chronic atrial fibrillation: Secondary | ICD-10-CM | POA: Diagnosis not present

## 2017-10-25 DIAGNOSIS — E1151 Type 2 diabetes mellitus with diabetic peripheral angiopathy without gangrene: Secondary | ICD-10-CM | POA: Diagnosis not present

## 2017-10-25 DIAGNOSIS — I70203 Unspecified atherosclerosis of native arteries of extremities, bilateral legs: Secondary | ICD-10-CM | POA: Diagnosis not present

## 2017-10-25 DIAGNOSIS — Z4781 Encounter for orthopedic aftercare following surgical amputation: Secondary | ICD-10-CM | POA: Diagnosis not present

## 2017-10-25 DIAGNOSIS — M86171 Other acute osteomyelitis, right ankle and foot: Secondary | ICD-10-CM | POA: Diagnosis not present

## 2017-10-25 DIAGNOSIS — D519 Vitamin B12 deficiency anemia, unspecified: Secondary | ICD-10-CM | POA: Diagnosis not present

## 2017-10-25 DIAGNOSIS — I482 Chronic atrial fibrillation: Secondary | ICD-10-CM | POA: Diagnosis not present

## 2017-10-27 ENCOUNTER — Ambulatory Visit (INDEPENDENT_AMBULATORY_CARE_PROVIDER_SITE_OTHER): Payer: Medicare Other | Admitting: *Deleted

## 2017-10-27 DIAGNOSIS — I4891 Unspecified atrial fibrillation: Secondary | ICD-10-CM

## 2017-10-27 DIAGNOSIS — D519 Vitamin B12 deficiency anemia, unspecified: Secondary | ICD-10-CM | POA: Diagnosis not present

## 2017-10-27 DIAGNOSIS — Z4781 Encounter for orthopedic aftercare following surgical amputation: Secondary | ICD-10-CM | POA: Diagnosis not present

## 2017-10-27 DIAGNOSIS — I482 Chronic atrial fibrillation: Secondary | ICD-10-CM | POA: Diagnosis not present

## 2017-10-27 DIAGNOSIS — E1151 Type 2 diabetes mellitus with diabetic peripheral angiopathy without gangrene: Secondary | ICD-10-CM | POA: Diagnosis not present

## 2017-10-27 DIAGNOSIS — M86171 Other acute osteomyelitis, right ankle and foot: Secondary | ICD-10-CM | POA: Diagnosis not present

## 2017-10-27 DIAGNOSIS — Z5181 Encounter for therapeutic drug level monitoring: Secondary | ICD-10-CM

## 2017-10-27 DIAGNOSIS — I70203 Unspecified atherosclerosis of native arteries of extremities, bilateral legs: Secondary | ICD-10-CM | POA: Diagnosis not present

## 2017-10-27 LAB — POCT INR: INR: 1.9 — AB (ref 2.0–3.0)

## 2017-10-27 NOTE — Patient Instructions (Signed)
Increase coumadin to 2 tablets daily Recheck in 1 week.  S/P amputation of toe 08/27/17.  Order given to [place]

## 2017-10-28 ENCOUNTER — Other Ambulatory Visit (HOSPITAL_COMMUNITY): Payer: Self-pay

## 2017-10-28 DIAGNOSIS — Z4781 Encounter for orthopedic aftercare following surgical amputation: Secondary | ICD-10-CM | POA: Diagnosis not present

## 2017-10-28 DIAGNOSIS — E1151 Type 2 diabetes mellitus with diabetic peripheral angiopathy without gangrene: Secondary | ICD-10-CM | POA: Diagnosis not present

## 2017-10-28 DIAGNOSIS — C61 Malignant neoplasm of prostate: Secondary | ICD-10-CM

## 2017-10-28 DIAGNOSIS — C7951 Secondary malignant neoplasm of bone: Secondary | ICD-10-CM

## 2017-10-28 DIAGNOSIS — M86171 Other acute osteomyelitis, right ankle and foot: Secondary | ICD-10-CM | POA: Diagnosis not present

## 2017-10-28 DIAGNOSIS — I70203 Unspecified atherosclerosis of native arteries of extremities, bilateral legs: Secondary | ICD-10-CM | POA: Diagnosis not present

## 2017-10-28 DIAGNOSIS — I482 Chronic atrial fibrillation: Secondary | ICD-10-CM | POA: Diagnosis not present

## 2017-10-28 DIAGNOSIS — D519 Vitamin B12 deficiency anemia, unspecified: Secondary | ICD-10-CM | POA: Diagnosis not present

## 2017-10-29 DIAGNOSIS — I70203 Unspecified atherosclerosis of native arteries of extremities, bilateral legs: Secondary | ICD-10-CM | POA: Diagnosis not present

## 2017-10-29 DIAGNOSIS — I482 Chronic atrial fibrillation: Secondary | ICD-10-CM | POA: Diagnosis not present

## 2017-10-29 DIAGNOSIS — M86171 Other acute osteomyelitis, right ankle and foot: Secondary | ICD-10-CM | POA: Diagnosis not present

## 2017-10-29 DIAGNOSIS — E1151 Type 2 diabetes mellitus with diabetic peripheral angiopathy without gangrene: Secondary | ICD-10-CM | POA: Diagnosis not present

## 2017-10-29 DIAGNOSIS — D519 Vitamin B12 deficiency anemia, unspecified: Secondary | ICD-10-CM | POA: Diagnosis not present

## 2017-10-29 DIAGNOSIS — S51802D Unspecified open wound of left forearm, subsequent encounter: Secondary | ICD-10-CM | POA: Diagnosis not present

## 2017-11-01 ENCOUNTER — Other Ambulatory Visit: Payer: Self-pay | Admitting: Cardiology

## 2017-11-01 DIAGNOSIS — M86171 Other acute osteomyelitis, right ankle and foot: Secondary | ICD-10-CM | POA: Diagnosis not present

## 2017-11-01 DIAGNOSIS — D519 Vitamin B12 deficiency anemia, unspecified: Secondary | ICD-10-CM | POA: Diagnosis not present

## 2017-11-01 DIAGNOSIS — E1142 Type 2 diabetes mellitus with diabetic polyneuropathy: Secondary | ICD-10-CM | POA: Diagnosis not present

## 2017-11-01 DIAGNOSIS — I70203 Unspecified atherosclerosis of native arteries of extremities, bilateral legs: Secondary | ICD-10-CM | POA: Diagnosis not present

## 2017-11-01 DIAGNOSIS — S51802D Unspecified open wound of left forearm, subsequent encounter: Secondary | ICD-10-CM | POA: Diagnosis not present

## 2017-11-01 DIAGNOSIS — E1151 Type 2 diabetes mellitus with diabetic peripheral angiopathy without gangrene: Secondary | ICD-10-CM | POA: Diagnosis not present

## 2017-11-01 DIAGNOSIS — I482 Chronic atrial fibrillation: Secondary | ICD-10-CM | POA: Diagnosis not present

## 2017-11-01 DIAGNOSIS — B351 Tinea unguium: Secondary | ICD-10-CM | POA: Diagnosis not present

## 2017-11-02 ENCOUNTER — Inpatient Hospital Stay (HOSPITAL_COMMUNITY): Payer: Medicare Other | Attending: Hematology

## 2017-11-02 ENCOUNTER — Inpatient Hospital Stay (HOSPITAL_COMMUNITY): Payer: Medicare Other

## 2017-11-02 ENCOUNTER — Other Ambulatory Visit: Payer: Self-pay

## 2017-11-02 VITALS — BP 140/81 | HR 90 | Temp 97.5°F | Resp 18

## 2017-11-02 DIAGNOSIS — C61 Malignant neoplasm of prostate: Secondary | ICD-10-CM

## 2017-11-02 DIAGNOSIS — C7951 Secondary malignant neoplasm of bone: Secondary | ICD-10-CM

## 2017-11-02 LAB — COMPREHENSIVE METABOLIC PANEL
ALT: 11 U/L (ref 0–44)
AST: 14 U/L — ABNORMAL LOW (ref 15–41)
Albumin: 3.5 g/dL (ref 3.5–5.0)
Alkaline Phosphatase: 63 U/L (ref 38–126)
Anion gap: 8 (ref 5–15)
BUN: 13 mg/dL (ref 8–23)
CO2: 27 mmol/L (ref 22–32)
Calcium: 8.6 mg/dL — ABNORMAL LOW (ref 8.9–10.3)
Chloride: 104 mmol/L (ref 98–111)
Creatinine, Ser: 0.71 mg/dL (ref 0.61–1.24)
GFR calc Af Amer: >60 mL/min (ref >60)
GFR calc non Af Amer: >60 mL/min (ref >60)
Glucose, Bld: 192 mg/dL — ABNORMAL HIGH (ref 70–99)
Potassium: 3.7 mmol/L (ref 3.5–5.1)
Sodium: 139 mmol/L (ref 135–145)
Total Bilirubin: 0.9 mg/dL (ref 0.3–1.2)
Total Protein: 6.4 g/dL — ABNORMAL LOW (ref 6.5–8.1)

## 2017-11-02 MED ORDER — DENOSUMAB 120 MG/1.7ML ~~LOC~~ SOLN
120.0000 mg | Freq: Once | SUBCUTANEOUS | Status: AC
  Administered 2017-11-02: 120 mg via SUBCUTANEOUS
  Filled 2017-11-02: qty 1.7

## 2017-11-02 NOTE — Progress Notes (Signed)
Pt here today for Xgeva injection. No prior auth needed per [employer] [employer]. Dr. Delton Coombes aware of calcium level and ok to give injection. Pt given injection in right arm. Pt tolerated injection well. Pt stable and discharged home ambulatory with cane and wife. Pt to return as scheduled.

## 2017-11-03 DIAGNOSIS — D510 Vitamin B12 deficiency anemia due to intrinsic factor deficiency: Secondary | ICD-10-CM | POA: Diagnosis not present

## 2017-11-04 ENCOUNTER — Ambulatory Visit (INDEPENDENT_AMBULATORY_CARE_PROVIDER_SITE_OTHER): Payer: Medicare Other | Admitting: *Deleted

## 2017-11-04 DIAGNOSIS — Z5181 Encounter for therapeutic drug level monitoring: Secondary | ICD-10-CM

## 2017-11-04 DIAGNOSIS — E1151 Type 2 diabetes mellitus with diabetic peripheral angiopathy without gangrene: Secondary | ICD-10-CM | POA: Diagnosis not present

## 2017-11-04 DIAGNOSIS — I4891 Unspecified atrial fibrillation: Secondary | ICD-10-CM | POA: Diagnosis not present

## 2017-11-04 DIAGNOSIS — I70203 Unspecified atherosclerosis of native arteries of extremities, bilateral legs: Secondary | ICD-10-CM | POA: Diagnosis not present

## 2017-11-04 DIAGNOSIS — I482 Chronic atrial fibrillation: Secondary | ICD-10-CM | POA: Diagnosis not present

## 2017-11-04 DIAGNOSIS — D519 Vitamin B12 deficiency anemia, unspecified: Secondary | ICD-10-CM | POA: Diagnosis not present

## 2017-11-04 DIAGNOSIS — S51802D Unspecified open wound of left forearm, subsequent encounter: Secondary | ICD-10-CM | POA: Diagnosis not present

## 2017-11-04 DIAGNOSIS — M86171 Other acute osteomyelitis, right ankle and foot: Secondary | ICD-10-CM | POA: Diagnosis not present

## 2017-11-04 LAB — POCT INR: INR: 2.3 (ref 2.0–3.0)

## 2017-11-04 NOTE — Patient Instructions (Signed)
Continue coumadin 2 tablets daily Recheck in 2 weeks Order given to [place]

## 2017-11-10 DIAGNOSIS — D519 Vitamin B12 deficiency anemia, unspecified: Secondary | ICD-10-CM | POA: Diagnosis not present

## 2017-11-10 DIAGNOSIS — I70203 Unspecified atherosclerosis of native arteries of extremities, bilateral legs: Secondary | ICD-10-CM | POA: Diagnosis not present

## 2017-11-10 DIAGNOSIS — S51802D Unspecified open wound of left forearm, subsequent encounter: Secondary | ICD-10-CM | POA: Diagnosis not present

## 2017-11-10 DIAGNOSIS — I482 Chronic atrial fibrillation: Secondary | ICD-10-CM | POA: Diagnosis not present

## 2017-11-10 DIAGNOSIS — M86171 Other acute osteomyelitis, right ankle and foot: Secondary | ICD-10-CM | POA: Diagnosis not present

## 2017-11-10 DIAGNOSIS — E1151 Type 2 diabetes mellitus with diabetic peripheral angiopathy without gangrene: Secondary | ICD-10-CM | POA: Diagnosis not present

## 2017-11-12 DIAGNOSIS — I482 Chronic atrial fibrillation: Secondary | ICD-10-CM | POA: Diagnosis not present

## 2017-11-12 DIAGNOSIS — E1151 Type 2 diabetes mellitus with diabetic peripheral angiopathy without gangrene: Secondary | ICD-10-CM | POA: Diagnosis not present

## 2017-11-12 DIAGNOSIS — S51802D Unspecified open wound of left forearm, subsequent encounter: Secondary | ICD-10-CM | POA: Diagnosis not present

## 2017-11-12 DIAGNOSIS — I70203 Unspecified atherosclerosis of native arteries of extremities, bilateral legs: Secondary | ICD-10-CM | POA: Diagnosis not present

## 2017-11-12 DIAGNOSIS — D519 Vitamin B12 deficiency anemia, unspecified: Secondary | ICD-10-CM | POA: Diagnosis not present

## 2017-11-12 DIAGNOSIS — M86171 Other acute osteomyelitis, right ankle and foot: Secondary | ICD-10-CM | POA: Diagnosis not present

## 2017-11-15 ENCOUNTER — Ambulatory Visit (INDEPENDENT_AMBULATORY_CARE_PROVIDER_SITE_OTHER): Payer: Medicare Other | Admitting: *Deleted

## 2017-11-15 DIAGNOSIS — S51802D Unspecified open wound of left forearm, subsequent encounter: Secondary | ICD-10-CM | POA: Diagnosis not present

## 2017-11-15 DIAGNOSIS — I4891 Unspecified atrial fibrillation: Secondary | ICD-10-CM | POA: Diagnosis not present

## 2017-11-15 DIAGNOSIS — Z5181 Encounter for therapeutic drug level monitoring: Secondary | ICD-10-CM | POA: Diagnosis not present

## 2017-11-15 DIAGNOSIS — I482 Chronic atrial fibrillation: Secondary | ICD-10-CM | POA: Diagnosis not present

## 2017-11-15 DIAGNOSIS — D519 Vitamin B12 deficiency anemia, unspecified: Secondary | ICD-10-CM | POA: Diagnosis not present

## 2017-11-15 DIAGNOSIS — M86171 Other acute osteomyelitis, right ankle and foot: Secondary | ICD-10-CM | POA: Diagnosis not present

## 2017-11-15 DIAGNOSIS — E1151 Type 2 diabetes mellitus with diabetic peripheral angiopathy without gangrene: Secondary | ICD-10-CM | POA: Diagnosis not present

## 2017-11-15 DIAGNOSIS — I70203 Unspecified atherosclerosis of native arteries of extremities, bilateral legs: Secondary | ICD-10-CM | POA: Diagnosis not present

## 2017-11-15 LAB — POCT INR: INR: 1.9 — AB (ref 2.0–3.0)

## 2017-11-15 NOTE — Patient Instructions (Signed)
Take 3 tablets tonight then resume 2 tablets daily Recheck in 1 week Order given to [facility]

## 2017-11-23 ENCOUNTER — Ambulatory Visit (INDEPENDENT_AMBULATORY_CARE_PROVIDER_SITE_OTHER): Payer: Medicare Other | Admitting: Cardiology

## 2017-11-23 DIAGNOSIS — Z5181 Encounter for therapeutic drug level monitoring: Secondary | ICD-10-CM

## 2017-11-23 DIAGNOSIS — E1151 Type 2 diabetes mellitus with diabetic peripheral angiopathy without gangrene: Secondary | ICD-10-CM | POA: Diagnosis not present

## 2017-11-23 DIAGNOSIS — I482 Chronic atrial fibrillation: Secondary | ICD-10-CM | POA: Diagnosis not present

## 2017-11-23 DIAGNOSIS — I4891 Unspecified atrial fibrillation: Secondary | ICD-10-CM | POA: Diagnosis not present

## 2017-11-23 DIAGNOSIS — D519 Vitamin B12 deficiency anemia, unspecified: Secondary | ICD-10-CM | POA: Diagnosis not present

## 2017-11-23 DIAGNOSIS — S51802D Unspecified open wound of left forearm, subsequent encounter: Secondary | ICD-10-CM | POA: Diagnosis not present

## 2017-11-23 DIAGNOSIS — M86171 Other acute osteomyelitis, right ankle and foot: Secondary | ICD-10-CM | POA: Diagnosis not present

## 2017-11-23 DIAGNOSIS — I70203 Unspecified atherosclerosis of native arteries of extremities, bilateral legs: Secondary | ICD-10-CM | POA: Diagnosis not present

## 2017-11-23 LAB — POCT INR: INR: 2.1 (ref 2.0–3.0)

## 2017-11-25 ENCOUNTER — Inpatient Hospital Stay (HOSPITAL_COMMUNITY): Payer: Medicare Other

## 2017-11-25 ENCOUNTER — Other Ambulatory Visit: Payer: Self-pay

## 2017-11-25 ENCOUNTER — Inpatient Hospital Stay (HOSPITAL_COMMUNITY)
Admission: EM | Admit: 2017-11-25 | Discharge: 2017-11-28 | DRG: 871 | Disposition: A | Discharge status: Home Health | Payer: Medicare Other | Attending: Internal Medicine | Admitting: Internal Medicine

## 2017-11-25 ENCOUNTER — Encounter (HOSPITAL_COMMUNITY): Payer: Self-pay

## 2017-11-25 ENCOUNTER — Emergency Department (HOSPITAL_COMMUNITY): Payer: Medicare Other

## 2017-11-25 DIAGNOSIS — R402252 Coma scale, best verbal response, oriented, at arrival to emergency department: Secondary | ICD-10-CM | POA: Diagnosis present

## 2017-11-25 DIAGNOSIS — R402142 Coma scale, eyes open, spontaneous, at arrival to emergency department: Secondary | ICD-10-CM | POA: Diagnosis present

## 2017-11-25 DIAGNOSIS — A084 Viral intestinal infection, unspecified: Secondary | ICD-10-CM | POA: Diagnosis present

## 2017-11-25 DIAGNOSIS — R918 Other nonspecific abnormal finding of lung field: Secondary | ICD-10-CM | POA: Diagnosis not present

## 2017-11-25 DIAGNOSIS — R197 Diarrhea, unspecified: Secondary | ICD-10-CM | POA: Diagnosis not present

## 2017-11-25 DIAGNOSIS — Z7901 Long term (current) use of anticoagulants: Secondary | ICD-10-CM

## 2017-11-25 DIAGNOSIS — M1990 Unspecified osteoarthritis, unspecified site: Secondary | ICD-10-CM | POA: Diagnosis present

## 2017-11-25 DIAGNOSIS — Z9049 Acquired absence of other specified parts of digestive tract: Secondary | ICD-10-CM

## 2017-11-25 DIAGNOSIS — Z8500 Personal history of malignant neoplasm of unspecified digestive organ: Secondary | ICD-10-CM

## 2017-11-25 DIAGNOSIS — N390 Urinary tract infection, site not specified: Secondary | ICD-10-CM

## 2017-11-25 DIAGNOSIS — A419 Sepsis, unspecified organism: Secondary | ICD-10-CM | POA: Diagnosis not present

## 2017-11-25 DIAGNOSIS — Z9842 Cataract extraction status, left eye: Secondary | ICD-10-CM

## 2017-11-25 DIAGNOSIS — A09 Infectious gastroenteritis and colitis, unspecified: Secondary | ICD-10-CM | POA: Diagnosis not present

## 2017-11-25 DIAGNOSIS — I10 Essential (primary) hypertension: Secondary | ICD-10-CM | POA: Diagnosis present

## 2017-11-25 DIAGNOSIS — K219 Gastro-esophageal reflux disease without esophagitis: Secondary | ICD-10-CM | POA: Diagnosis present

## 2017-11-25 DIAGNOSIS — R109 Unspecified abdominal pain: Secondary | ICD-10-CM | POA: Diagnosis not present

## 2017-11-25 DIAGNOSIS — C8590 Non-Hodgkin lymphoma, unspecified, unspecified site: Secondary | ICD-10-CM | POA: Diagnosis present

## 2017-11-25 DIAGNOSIS — E1165 Type 2 diabetes mellitus with hyperglycemia: Secondary | ICD-10-CM | POA: Diagnosis not present

## 2017-11-25 DIAGNOSIS — D472 Monoclonal gammopathy: Secondary | ICD-10-CM | POA: Diagnosis present

## 2017-11-25 DIAGNOSIS — Z7984 Long term (current) use of oral hypoglycemic drugs: Secondary | ICD-10-CM

## 2017-11-25 DIAGNOSIS — J189 Pneumonia, unspecified organism: Secondary | ICD-10-CM | POA: Diagnosis present

## 2017-11-25 DIAGNOSIS — Z8546 Personal history of malignant neoplasm of prostate: Secondary | ICD-10-CM

## 2017-11-25 DIAGNOSIS — Z7952 Long term (current) use of systemic steroids: Secondary | ICD-10-CM

## 2017-11-25 DIAGNOSIS — Z85828 Personal history of other malignant neoplasm of skin: Secondary | ICD-10-CM

## 2017-11-25 DIAGNOSIS — Z881 Allergy status to other antibiotic agents status: Secondary | ICD-10-CM

## 2017-11-25 DIAGNOSIS — R402362 Coma scale, best motor response, obeys commands, at arrival to emergency department: Secondary | ICD-10-CM | POA: Diagnosis present

## 2017-11-25 DIAGNOSIS — Z803 Family history of malignant neoplasm of breast: Secondary | ICD-10-CM | POA: Diagnosis not present

## 2017-11-25 DIAGNOSIS — C7951 Secondary malignant neoplasm of bone: Secondary | ICD-10-CM | POA: Diagnosis present

## 2017-11-25 DIAGNOSIS — Z8249 Family history of ischemic heart disease and other diseases of the circulatory system: Secondary | ICD-10-CM

## 2017-11-25 DIAGNOSIS — I4891 Unspecified atrial fibrillation: Secondary | ICD-10-CM | POA: Diagnosis present

## 2017-11-25 DIAGNOSIS — E860 Dehydration: Secondary | ICD-10-CM | POA: Diagnosis not present

## 2017-11-25 DIAGNOSIS — I4510 Unspecified right bundle-branch block: Secondary | ICD-10-CM | POA: Diagnosis present

## 2017-11-25 DIAGNOSIS — I959 Hypotension, unspecified: Secondary | ICD-10-CM | POA: Diagnosis present

## 2017-11-25 DIAGNOSIS — Z800 Family history of malignant neoplasm of digestive organs: Secondary | ICD-10-CM

## 2017-11-25 DIAGNOSIS — Z9841 Cataract extraction status, right eye: Secondary | ICD-10-CM

## 2017-11-25 DIAGNOSIS — Z808 Family history of malignant neoplasm of other organs or systems: Secondary | ICD-10-CM

## 2017-11-25 DIAGNOSIS — E1151 Type 2 diabetes mellitus with diabetic peripheral angiopathy without gangrene: Secondary | ICD-10-CM | POA: Diagnosis present

## 2017-11-25 DIAGNOSIS — I481 Persistent atrial fibrillation: Secondary | ICD-10-CM | POA: Diagnosis not present

## 2017-11-25 HISTORY — DX: Diarrhea, unspecified: R19.7

## 2017-11-25 HISTORY — DX: Urinary tract infection, site not specified: N39.0

## 2017-11-25 LAB — CBC
HCT: 38.3 % — ABNORMAL LOW (ref 39.0–52.0)
Hemoglobin: 13.6 g/dL (ref 13.0–17.0)
MCH: 31 pg (ref 26.0–34.0)
MCHC: 35.5 g/dL (ref 30.0–36.0)
MCV: 87.2 fL (ref 78.0–100.0)
Platelets: 134 10*3/uL — ABNORMAL LOW (ref 150–400)
RBC: 4.39 MIL/uL (ref 4.22–5.81)
RDW: 13.1 % (ref 11.5–15.5)
WBC: 8.4 10*3/uL (ref 4.0–10.5)

## 2017-11-25 LAB — COMPREHENSIVE METABOLIC PANEL
ALT: 15 U/L (ref 0–44)
AST: 25 U/L (ref 15–41)
Albumin: 3.2 g/dL — ABNORMAL LOW (ref 3.5–5.0)
Alkaline Phosphatase: 58 U/L (ref 38–126)
Anion gap: 9 (ref 5–15)
BUN: 24 mg/dL — ABNORMAL HIGH (ref 8–23)
CO2: 21 mmol/L — ABNORMAL LOW (ref 22–32)
Calcium: 8.1 mg/dL — ABNORMAL LOW (ref 8.9–10.3)
Chloride: 106 mmol/L (ref 98–111)
Creatinine, Ser: 1.06 mg/dL (ref 0.61–1.24)
GFR calc Af Amer: >60 mL/min (ref >60)
GFR calc non Af Amer: 59 mL/min — ABNORMAL LOW (ref >60)
Glucose, Bld: 167 mg/dL — ABNORMAL HIGH (ref 70–99)
Potassium: 3 mmol/L — ABNORMAL LOW (ref 3.5–5.1)
Sodium: 136 mmol/L (ref 135–145)
Total Bilirubin: 0.6 mg/dL (ref 0.3–1.2)
Total Protein: 6 g/dL — ABNORMAL LOW (ref 6.5–8.1)

## 2017-11-25 LAB — URINALYSIS, ROUTINE W REFLEX MICROSCOPIC
Bilirubin Urine: NEGATIVE
Glucose, UA: NEGATIVE mg/dL
Ketones, ur: NEGATIVE mg/dL
Nitrite: POSITIVE — AB
Protein, ur: NEGATIVE mg/dL
Specific Gravity, Urine: 1.032 — ABNORMAL HIGH (ref 1.005–1.030)
WBC, UA: >50 WBC/hpf — ABNORMAL HIGH (ref 0–5)
pH: 5 (ref 5.0–8.0)

## 2017-11-25 LAB — LACTIC ACID, PLASMA: Lactic Acid, Venous: 1.1 mmol/L (ref 0.5–1.9)

## 2017-11-25 LAB — PROTIME-INR
INR: 3.38
Prothrombin Time: 33.9 seconds — ABNORMAL HIGH (ref 11.4–15.2)

## 2017-11-25 LAB — GLUCOSE, CAPILLARY: Glucose-Capillary: 88 mg/dL (ref 70–99)

## 2017-11-25 LAB — LIPASE, BLOOD: Lipase: 17 U/L (ref 11–51)

## 2017-11-25 MED ORDER — INSULIN ASPART 100 UNIT/ML ~~LOC~~ SOLN
0.0000 [IU] | Freq: Three times a day (TID) | SUBCUTANEOUS | Status: DC
  Administered 2017-11-26 – 2017-11-28 (×4): 1 [IU] via SUBCUTANEOUS
  Administered 2017-11-28: 2 [IU] via SUBCUTANEOUS

## 2017-11-25 MED ORDER — ABIRATERONE ACETATE 250 MG PO TABS
1000.0000 mg | ORAL_TABLET | Freq: Every day | ORAL | Status: DC
  Filled 2017-11-25 (×2): qty 4

## 2017-11-25 MED ORDER — VANCOMYCIN HCL 10 G IV SOLR
1500.0000 mg | Freq: Once | INTRAVENOUS | Status: AC
  Administered 2017-11-25: 1500 mg via INTRAVENOUS
  Filled 2017-11-25 (×2): qty 1500

## 2017-11-25 MED ORDER — PREDNISONE 2.5 MG PO TABS
2.5000 mg | ORAL_TABLET | Freq: Two times a day (BID) | ORAL | Status: DC
  Administered 2017-11-25 – 2017-11-28 (×5): 2.5 mg via ORAL
  Filled 2017-11-25 (×8): qty 1

## 2017-11-25 MED ORDER — POTASSIUM CHLORIDE 10 MEQ/100ML IV SOLN
10.0000 meq | INTRAVENOUS | Status: AC
  Administered 2017-11-25 (×3): 10 meq via INTRAVENOUS
  Filled 2017-11-25 (×3): qty 100

## 2017-11-25 MED ORDER — SENNOSIDES-DOCUSATE SODIUM 8.6-50 MG PO TABS: 1.0000 | ORAL_TABLET | Freq: Every day | ORAL | Status: DC | PRN

## 2017-11-25 MED ORDER — ALBUTEROL SULFATE (2.5 MG/3ML) 0.083% IN NEBU: 3.0000 mL | INHALATION_SOLUTION | Freq: Four times a day (QID) | RESPIRATORY_TRACT | Status: DC | PRN

## 2017-11-25 MED ORDER — ACETAMINOPHEN 500 MG PO TABS: 500.0000 mg | ORAL_TABLET | Freq: Every day | ORAL | Status: DC | PRN

## 2017-11-25 MED ORDER — LEUPROLIDE ACETATE (6 MONTH) 45 MG IM KIT: 45.0000 mg | PACK | INTRAMUSCULAR | Status: DC

## 2017-11-25 MED ORDER — MIRTAZAPINE 15 MG PO TABS
15.0000 mg | ORAL_TABLET | Freq: Every day | ORAL | Status: DC
  Administered 2017-11-25 – 2017-11-27 (×3): 15 mg via ORAL
  Filled 2017-11-25 (×3): qty 1

## 2017-11-25 MED ORDER — LOPERAMIDE HCL 2 MG PO CAPS
2.0000 mg | ORAL_CAPSULE | ORAL | Status: DC | PRN
  Administered 2017-11-26: 2 mg via ORAL
  Filled 2017-11-25 (×2): qty 1

## 2017-11-25 MED ORDER — CIPROFLOXACIN IN D5W 400 MG/200ML IV SOLN
400.0000 mg | Freq: Two times a day (BID) | INTRAVENOUS | Status: DC
  Administered 2017-11-25: 400 mg via INTRAVENOUS
  Filled 2017-11-25: qty 200

## 2017-11-25 MED ORDER — SODIUM CHLORIDE 0.9 % IV SOLN
Freq: Once | INTRAVENOUS | Status: AC
  Administered 2017-11-25: 13:00:00 via INTRAVENOUS

## 2017-11-25 MED ORDER — SODIUM CHLORIDE 0.9 % IV SOLN
1.0000 g | Freq: Two times a day (BID) | INTRAVENOUS | Status: DC
  Administered 2017-11-25 – 2017-11-28 (×6): 1 g via INTRAVENOUS
  Filled 2017-11-25 (×9): qty 1

## 2017-11-25 MED ORDER — METRONIDAZOLE IN NACL 5-0.79 MG/ML-% IV SOLN
500.0000 mg | Freq: Once | INTRAVENOUS | Status: AC
  Administered 2017-11-25: 500 mg via INTRAVENOUS
  Filled 2017-11-25: qty 100

## 2017-11-25 MED ORDER — POTASSIUM CHLORIDE CRYS ER 20 MEQ PO TBCR
20.0000 meq | EXTENDED_RELEASE_TABLET | Freq: Every day | ORAL | Status: DC
  Administered 2017-11-25 – 2017-11-28 (×4): 20 meq via ORAL
  Filled 2017-11-25 (×6): qty 1

## 2017-11-25 MED ORDER — AMITRIPTYLINE HCL 25 MG PO TABS
25.0000 mg | ORAL_TABLET | Freq: Every day | ORAL | Status: DC
  Administered 2017-11-25 – 2017-11-27 (×3): 25 mg via ORAL
  Filled 2017-11-25 (×3): qty 1

## 2017-11-25 MED ORDER — POTASSIUM CHLORIDE IN NACL 20-0.9 MEQ/L-% IV SOLN
INTRAVENOUS | Status: DC
  Administered 2017-11-25 – 2017-11-28 (×6): via INTRAVENOUS

## 2017-11-25 MED ORDER — CYANOCOBALAMIN 1000 MCG/ML IJ SOLN
1000.0000 ug | INTRAMUSCULAR | Status: DC
  Administered 2017-11-26: 1000 ug via SUBCUTANEOUS
  Filled 2017-11-25: qty 1

## 2017-11-25 MED ORDER — DENOSUMAB 120 MG/1.7ML ~~LOC~~ SOLN: 120.0000 mg | SUBCUTANEOUS | Status: DC

## 2017-11-25 MED ORDER — DOCUSATE SODIUM 100 MG PO CAPS
100.0000 mg | ORAL_CAPSULE | Freq: Every day | ORAL | Status: DC | PRN
Start: 2017-11-25 — End: 2017-11-28

## 2017-11-25 MED ORDER — CIPROFLOXACIN IN D5W 400 MG/200ML IV SOLN: 400.0000 mg | Freq: Once | INTRAVENOUS | Status: DC

## 2017-11-25 MED ORDER — PHENYLEPH-SHARK LIV OIL-MO-PET 0.25-3-14-71.9 % RE OINT
1.0000 "application " | TOPICAL_OINTMENT | Freq: Two times a day (BID) | RECTAL | Status: DC | PRN
  Filled 2017-11-25: qty 28

## 2017-11-25 MED ORDER — DIPHENHYDRAMINE HCL 25 MG PO CAPS
25.0000 mg | ORAL_CAPSULE | Freq: Four times a day (QID) | ORAL | Status: DC | PRN
  Filled 2017-11-25: qty 1

## 2017-11-25 MED ORDER — SODIUM CHLORIDE 0.9 % IV SOLN
Freq: Once | INTRAVENOUS | Status: AC
  Administered 2017-11-25: 12:00:00 via INTRAVENOUS

## 2017-11-25 MED ORDER — IOPAMIDOL (ISOVUE-300) INJECTION 61%
100.0000 mL | Freq: Once | INTRAVENOUS | Status: AC | PRN
  Administered 2017-11-25: 100 mL via INTRAVENOUS

## 2017-11-25 NOTE — Progress Notes (Signed)
Pharmacy Antibiotic Note  [name] is a 84 y.o. male admitted on 11/25/2017 with sepsis.  Pharmacy has been consulted for vancomycin and cefepime  dosing.  Plan: Loading dose:  Vancomycin 1.5g x1 dose, then vancomycin 1g IV q24h Goal vancomycin trough: 15-20  Mcg/mL Start cefepime 1g IV q12h Pharmacy will continue to monitor renal function, vancomycin troughs, cultures and patient progress.   Height: 5\' 10"  (177.8 cm) Weight: 154 lb 5.2 oz (70 kg) IBW/kg (Calculated) : 73  Temp (24hrs), Avg:98.4 F (36.9 C), Min:98.1 F (36.7 C), Max:98.6 F (37 C)  Recent Labs  Lab 11/25/17 1141 11/25/17 1713  WBC 8.4  --   CREATININE 1.06  --   LATICACIDVEN  --  1.1    Estimated Creatinine Clearance: 44.9 mL/min (by C-G formula based on SCr of 1.06 mg/dL).    Allergies  Allergen Reactions  . Augmentin [Amoxicillin-Pot Clavulanate] Rash    Redness of skin    Antimicrobials this admission: 11/25/17 vancomycin >>  8/819 cefepime >>  11/25/17 ciprofloxacin>>11/25/17 11/25/17 metronidazole>>11/25/17   Microbiology results:  11/25/17 GI panel stool PCR: pending  Thank you for allowing pharmacy to be a part of this patient's care.  Despina Pole, Pharm. D. Clinical Pharmacist 11/25/2017 9:40 PM

## 2017-11-25 NOTE — ED Notes (Signed)
Patient cleaned and brief opened underneath. Ready for transport.

## 2017-11-25 NOTE — ED Notes (Signed)
Unsuccessful attempts to cath patient d/t resistance x2. MD aware. Pt states he will let staff know if he feels the urge to void.

## 2017-11-25 NOTE — ED Notes (Signed)
Patient taking PO fluids without difficulty. Urine sample obtained.

## 2017-11-25 NOTE — ED Triage Notes (Signed)
Pt sent by Dr Legrand Rams due to multiple episodes of diarrhea since Tuesday.  Pt has taken multiple imodium without relief

## 2017-11-25 NOTE — ED Notes (Signed)
Report given to Lauren, RN.

## 2017-11-25 NOTE — ED Provider Notes (Signed)
[facility] EMERGENCY DEPARTMENT Provider Note   CSN: [id number] Arrival date & time: 11/25/17  1051     History   Chief Complaint Chief Complaint  Patient presents with  . Diarrhea    HPI [name] is a 84 y.o. male.  He is here with his wife and daughter for evaluation of diarrhea abdominal pain and fever.  Started on Tuesday with multiple episodes of nonbloody diarrhea and mild crampy low abdominal pain.  He has had over 9 episodes a day.  He was prescribed some Imodium which has helped a little bit.  He saw his primary care doctor Dr. Legrand Rams today and was referred over to the ER for low blood pressure.  There is been some low-grade fever and one episode of nausea vomiting.  There is some sick contacts in the family with the grandkids but that seemed to be last week.  No recent travel no change in medications.  He has had some falls recently but none since this illness.  Denies chest pain shortness of breath headache.  The history is provided by the patient, the spouse and a relative.  Abdominal Pain   This is a new problem. The current episode started 2 days ago. The problem occurs constantly. The problem has not changed since onset.The pain is associated with an unknown factor. The pain is located in the LLQ and RLQ. The quality of the pain is cramping. The pain is mild. Associated symptoms include fever, diarrhea, nausea and vomiting. Pertinent negatives include melena, constipation, dysuria, frequency, hematuria and headaches. Nothing aggravates the symptoms. Nothing relieves the symptoms.    Past Medical History:  Diagnosis Date  . Acute bronchitis   . Arthritis   . Atrial fibrillation ([place])    Dr. Harl Bowie- [place] follows saw 11'14  . Bladder stones    tx. with oral meds and antibiotics, now surgery planned  . Cataracts, both eyes    surgery planned May 2015  . Diabetes mellitus without complication ([place])    Type II  . Dyspnea    with activity  . Dysrhythmia    Afib   . Family history of breast cancer   . Family history of colon cancer   . GERD (gastroesophageal reflux disease)   . 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 Margot Chimes, thus far without recurrence.   . Hypertension   . NHL (non-Hodgkin's lymphoma) ([place]) 2005   Diffuse large B-cell lymphoma, clinically stage IIIA, CD20 positive, status post cervical lymph node biopsy 08/03/2003 on the left. PET scan was also positive in the spleen and small bowel region, but bone marrow aspiration and biopsy were negative. So he essentially had stage IIIAs. He received R-CHOP x6 cycles with CR established by PET scan criteria on 11/23/2003 with no evidence for relapse th  . PAD (peripheral artery disease) ([place]) 08/27/2017  . Skin cancer    Basal cell- face,head, neck,  arms, legs, Back    Patient Active Problem List   Diagnosis Date Noted  . PAD (peripheral artery disease) ([place]) 08/27/2017  . Great toe amputation status, left ([place]) 07/30/2017  . Osteomyelitis of toe of left foot ([place]) 06/29/2017  . Acute bronchitis with bronchospasm 03/16/2017  . Type 2 diabetes mellitus ([place]) 03/16/2017  . Hypomagnesemia 03/16/2017  .  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 Postural dizziness with presyncope 02/19/2017  . Polyneuropathy 02/19/2017  . Numbness 02/19/2017  . Urinary frequency 02/19/2017  . Genetic testing 02/09/2017  . Family history of breast cancer   . Family history of colon cancer   . Other dysphagia 01/21/2016  . Bone metastases ([place]) 08/21/2015  . Prostate cancer ([place]) 09/08/2013  . Hypertrophy of prostate with urinary obstruction and other lower urinary tract symptoms (LUTS) 08/10/2013  . Encounter for therapeutic drug monitoring 06/12/2013  . NHL  (non-Hodgkin's lymphoma) ([place]) 08/18/2011  . GIST (gastrointestinal stromal tumor), malignant ([place]) 08/18/2011  . Long term (current) use of anticoagulants 07/18/2010  . Essential hypertension 10/04/2008  . ATRIAL FIBRILLATION 10/04/2008    Past Surgical History:  Procedure Laterality Date  . AMPUTATION Left 06/29/2017   Procedure: AMPUTATION LEFT GREAT TOE;  Surgeon: [name], MD;  Location: [place];  Service: Vascular;  Laterality: Left;  . AMPUTATION Left 08/27/2017   Procedure: AMPUTATION Left SECOND TOE;  Surgeon: [name], MD;  Location: [place];  Service: Vascular;  Laterality: Left;  . CATARACT EXTRACTION Bilateral 2015  . CHOLECYSTECTOMY    . COLON RESECTION     small bowel  . CYSTOSCOPY WITH LITHOLAPAXY N/A 08/10/2013   Procedure: CYSTOSCOPY WITH LITHOLAPAXY WITH [name];  Surgeon: [name], MD;  Location: [facility];  Service: Urology;  Laterality: N/A;  . ESOPHAGEAL DILATION N/A 02/27/2016   Procedure: ESOPHAGEAL DILATION;  Surgeon: [name], MD;  [facility];  Service: Endoscopy;  Laterality: N/A;  . ESOPHAGOGASTRODUODENOSCOPY N/A 02/27/2016   Procedure: ESOPHAGOGASTRODUODENOSCOPY (EGD);  Surgeon: [name], MD;  [facility];  Service: Endoscopy;  Laterality: N/A;  12:00  . INCISION AND DRAINAGE PERIRECTAL ABSCESS    . LOWER EXTREMITY ANGIOGRAPHY N/A 06/25/2017   Procedure: LOWER EXTREMITY ANGIOGRAPHY;  Surgeon: [name], MD;  Location: [facility];  Service: Cardiovascular;  Laterality: N/A;  . LYMPH NODE DISSECTION Left    '05-neck  . PORT-A-CATH REMOVAL    . PORTACATH PLACEMENT     insertion and removal -last chemotherapy 10 yrs ago  . TRANSURETHRAL RESECTION OF PROSTATE N/A 08/10/2013   Procedure: TRANSURETHRAL RESECTION OF THE PROSTATE WITH GYRUS INSTRUMENTS;  Surgeon: [name], MD;  Location: [facility];  Service: Urology;  Laterality: N/A;        Home Medications    Prior to Admission  medications   Medication Sig Start Date End Date Taking? Authorizing Provider  abiraterone acetate (ZYTIGA) 250 MG tablet Take 4 tablets (1,000 mg total) by mouth daily. Take on an empty stomach 1 hour before or 2 hours after a meal 04/21/17   Jacquelin Hawking, NP  acetaminophen (TYLENOL) 500 MG tablet Take 500-1,000 mg by mouth daily as needed for mild pain, fever or headache.    [provider]  amitriptyline (ELAVIL) 25 MG tablet Take 25 mg by mouth at bedtime.    [provider]  CALCIUM CARB-CHOLECALCIFEROL PO Take 1 tablet by mouth daily.     [provider]  clotrimazole-betamethasone (LOTRISONE) cream Apply 1 application topically daily as needed (for fungus).  06/18/15   [provider]  CRANBERRY PO Take 4,200 mg by mouth daily.    [provider]  cyanocobalamin (,VITAMIN B-12,) 1000 MCG/ML injection Inject 1,000 mcg every 30 (thirty) days into the skin. 02/25/17   [provider]  denosumab (XGEVA) 120 MG/1.7ML SOLN injection Inject 120 mg into  the skin every 30 (thirty) days.     [provider]  diltiazem (CARDIZEM) 90 MG tablet TAKE 1 TABLET BY MOUTH TWICE DAILY 11/01/17   Arnoldo Lenis, MD  diphenhydrAMINE (BENADRYL) 25 MG tablet Take 25 mg by mouth every 6 (six) hours as needed for itching.    [provider]  docusate sodium (COLACE) 100 MG capsule Take 100 mg by mouth daily as needed for mild constipation.    [provider]  furosemide (LASIX) 20 MG tablet Take 1 tablet (20 mg total) by mouth daily. 03/18/17 03/18/18  Kathie Dike, MD  glipiZIDE (GLUCOTROL XL) 5 MG 24 hr tablet Take 5 mg by mouth daily with breakfast.  06/25/15   [provider]  Leuprolide Acetate, 6 Month, (LUPRON DEPOT) 45 MG injection Inject 45 mg into the muscle every 6 (six) months.    [provider]  lisinopril (PRINIVIL,ZESTRIL) 20 MG tablet Take 1 tablet (20 mg total) daily by mouth. 02/25/17   Sater,  [name], MD  loperamide (IMODIUM) 2 MG capsule Take 2 mg by mouth as needed for diarrhea or loose stools.    [provider]  metFORMIN (GLUCOPHAGE) 850 MG tablet Take 500 mg by mouth 2 (two) times daily with a meal.  03/23/17   [provider]  mirtazapine (REMERON) 15 MG tablet Take 15 mg by mouth at bedtime.    [provider]  nystatin (MYCOSTATIN) powder Apply 1 g topically 4 (four) times daily as needed (fungus).     [provider]  omeprazole (PRILOSEC) 20 MG capsule Take 40 mg by mouth daily.     [provider]  phenylephrine-shark liver oil-mineral oil-petrolatum (PREPARATION H) 0.25-3-14-71.9 % rectal ointment Place 1 application rectally 2 (two) times daily as needed (for chaffing).    [provider]  potassium chloride (K-DUR,KLOR-CON) 10 MEQ tablet Take 2 tablets (20 mEq total) by mouth daily. 06/21/17   Higgs, Mathis Dad, MD  potassium chloride (MICRO-K) 10 MEQ CR capsule Take 10 mEq by mouth 2 (two) times daily. 10/13/17   [provider]  predniSONE (DELTASONE) 5 MG tablet Take 1 tablet (5 mg total) by mouth 2 (two) times daily with a meal. Patient taking differently: Take 2.5 mg by mouth 2 (two) times daily with a meal.  05/17/17   Holley Bouche, NP  sennosides-docusate sodium (SENOKOT-S) 8.6-50 MG tablet Take 1 tablet by mouth daily as needed for constipation.     [provider]  VENTOLIN HFA 108 (90 Base) MCG/ACT inhaler Inhale 1-2 puffs into the lungs every 6 (six) hours as needed for wheezing or shortness of breath.  03/08/17   [provider]  warfarin (COUMADIN) 2.5 MG tablet Take 2 tablets daily except 1 tablet on Tuesdays and Fridays or as directed Patient taking differently: Take 2.5-5 mg by mouth See admin instructions. Take 2 tablets daily except 1 tablet on  Fridays or as directed 08/11/17   Arnoldo Lenis, MD    Family History Family History  Problem Relation Age of Onset  . Heart  attack Mother        died in her 40s  . Other Father 36       pneumonia - died  . Breast cancer Sister        dx in her 27s-60s  . Colon cancer Brother        dx in his 109s  . Cancer Sister        TAH/BSO due  to "male cancer"  . Breast cancer Daughter 41  . Breast cancer Daughter 30       DCIS - ATM VUS on [employer] 83 gene panel in 2018  . Breast cancer Daughter 76       DCIS - Negative on [employer] MyRisk 25 gene apnel in 2015  . Thyroid cancer Daughter 53       Medullary and Papillary  . Thyroid nodules Grandchild     Social History Social History   Tobacco Use  . Smoking status: Never Smoker  . Smokeless tobacco: Never Used  Substance Use Topics  . Alcohol use: No  . Drug use: No     Allergies   Augmentin [amoxicillin-pot clavulanate]   Review of Systems Review of Systems  Constitutional: Positive for fever.  HENT: Negative for sore throat.   Eyes: Negative for visual disturbance.  Respiratory: Negative for shortness of breath.   Cardiovascular: Negative for chest pain.  Gastrointestinal: Positive for abdominal pain, diarrhea, nausea and vomiting. Negative for constipation and melena.  Genitourinary: Negative for dysuria, frequency and hematuria.  Musculoskeletal: Negative for neck pain.  Skin: Positive for wound (skin tear left forearm). Negative for rash.  Neurological: Positive for weakness. Negative for headaches.     Physical Exam Updated Vital Signs BP 103/60   Pulse 75   Temp 98.6 F (37 C) (Oral)   Resp 16   Ht 5' 10"  (1.778 m)   Wt 68.9 kg   SpO2 96%   BMI 21.81 kg/m   Physical Exam  Constitutional: He appears well-developed and well-nourished.  HENT:  Head: Normocephalic and atraumatic.  Eyes: Conjunctivae are normal.  Neck: Neck supple.  Cardiovascular: Normal rate, regular rhythm and normal heart sounds.  No murmur heard. Pulmonary/Chest: Effort normal and breath sounds normal. No respiratory distress.  Abdominal: Soft. He exhibits  distension. He exhibits no mass. There is tenderness (lower abd). There is no rebound and no guarding.  Musculoskeletal: Normal range of motion. He exhibits no edema or deformity.  Neurological: He is alert. GCS eye subscore is 4. GCS verbal subscore is 5. GCS motor subscore is 6.  Skin: Skin is warm and dry. Capillary refill takes less than 2 seconds.  Psychiatric: He has a normal mood and affect.  Nursing note and vitals reviewed.    ED Treatments / Results  Labs (all labs ordered are listed, but only abnormal results are displayed) Labs Reviewed  COMPREHENSIVE METABOLIC PANEL - Abnormal; Notable for the following components:      Result Value   Potassium 3.0 (*)    CO2 21 (*)    Glucose, Bld 167 (*)    BUN 24 (*)    Calcium 8.1 (*)    Total Protein 6.0 (*)    Albumin 3.2 (*)    GFR calc non Af Amer 59 (*)    All other components within normal limits  CBC - Abnormal; Notable for the following components:   HCT 38.3 (*)    Platelets 134 (*)    All other components within normal limits  URINALYSIS, ROUTINE W REFLEX MICROSCOPIC - Abnormal; Notable for the following components:   APPearance CLOUDY (*)    Specific Gravity, Urine 1.032 (*)    Hgb urine dipstick SMALL (*)    Nitrite POSITIVE (*)    Leukocytes, UA LARGE (*)    WBC, UA >50 (*)    Bacteria, UA MANY (*)    All other components within normal limits  PROTIME-INR - Abnormal;  Notable for the following components:   Prothrombin Time 33.9 (*)    All other components within normal limits  GASTROINTESTINAL PANEL BY PCR, STOOL (REPLACES STOOL CULTURE)  LIPASE, BLOOD  LACTIC ACID, PLASMA  BASIC METABOLIC PANEL  CBC    EKG EKG Interpretation  Date/Time:  Thursday November 25 2017 12:03:58 EDT Ventricular Rate:  86 PR Interval:    QRS Duration: 162 QT Interval:  415 QTC Calculation: 497 R Axis:   120 Text Interpretation:  Atrial fibrillation RBBB and LPFB Lateral infarct, old similar to prior 11/18 Confirmed by  Aletta Edouard [phone]) on 11/25/2017 12:32:19 PM   Radiology Dg Chest 1 View  Result Date: 11/25/2017 CLINICAL DATA:  Diarrhea x 2 days, weakness. History of skin cancer, DM, HTN, A-FIB, PAD, GERD, dysrhythmia. EXAM: CHEST  1 VIEW COMPARISON:  Chest x-ray dated 03/16/2017. FINDINGS: Bibasilar opacities, LEFT greater than RIGHT. Stable cardiomegaly. Aortic atherosclerosis. No acute or suspicious osseous finding. IMPRESSION: 1. New bibasilar opacities, LEFT greater than RIGHT. These could represent pneumonia, atelectasis or asymmetric edema. 2. Stable cardiomegaly. 3. Aortic atherosclerosis. Electronically Signed   By: Franki Cabot M.D.   On: 11/25/2017 17:14   Ct Abdomen Pelvis W Contrast  Result Date: 11/25/2017 CLINICAL DATA:  Generalized abdominal pain, RIGHT lower quadrant abdominal pain, diarrhea, history gest, non-Hodgkin's lymphoma, atrial fibrillation, bladder calculi, type II diabetes mellitus, hypertension EXAM: CT ABDOMEN AND PELVIS WITH CONTRAST TECHNIQUE: Multidetector CT imaging of the abdomen and pelvis was performed using the standard protocol following bolus administration of intravenous contrast. Sagittal and coronal MPR images reconstructed from axial data set. CONTRAST:  140m ISOVUE-300 IOPAMIDOL (ISOVUE-300) INJECTION 61% IV. No oral contrast. COMPARISON:  08/02/2017 FINDINGS: Lower chest: Bibasilar atelectasis Hepatobiliary: Gallbladder surgically absent.  Liver unremarkable Pancreas: Numerous pancreatic calcifications consistent with chronic calcific pancreatitis. Pancreatic atrophy. Cystic lesion at pancreatic tail 16 x 12 mm image 26 unchanged. Minimal cystic change at pancreatic head appears unchanged. Spleen: Normal appearance Adrenals/Urinary Tract: Adrenal glands normal appearance. Cortical scarring RIGHT kidney. Kidneys and ureters otherwise normal appearance. Nodular thickening from tiny diverticula at the anterior superior bladder wall unchanged since previous study as well as  an earlier exam from 10/05/2014. Stomach/Bowel: Question anorectal wall thickening. Scattered fluid throughout colon consistent with history of diarrhea. Normal appendix. Stomach decompressed, with suboptimal assessment of gastric wall thickness. Small bowel loops unremarkable. Vascular/Lymphatic: Atherosclerotic calcifications aorta and iliac arteries. Aorta normal caliber. Coronary arterial calcification noted. No adenopathy. Reproductive: Unremarkable Other: Small LEFT inguinal hernia containing fat. No free air or free fluid. No definite inflammatory process. Musculoskeletal: Degenerative disc disease changes lumbar spine. IMPRESSION: Tiny bladder diverticula. Chronic calcific pancreatitis. Stable cystic lesion at pancreatic tail 16 x 12 mm and minimal cystic change at pancreatic head stable. Question anorectal wall thickening; correlation with proctoscopy recommended to exclude inflammation and mass. Small LEFT inguinal hernia. Aortic Atherosclerosis (ICD10-I70.0). Electronically Signed   By: MLavonia DanaM.D.   On: 11/25/2017 14:44    Procedures Procedures (including critical care time)  Medications Ordered in ED Medications  0.9 %  sodium chloride infusion (has no administration in time range)  0.9 %  sodium chloride infusion ( Intravenous New Bag/Given 11/25/17 1143)     Initial Impression / Assessment and Plan / ED Course  I have reviewed the triage vital signs and the nursing notes.  Pertinent labs & imaging results that were available during my care of the patient were reviewed by me and considered in my medical decision making (see chart for  details).  Clinical Course as of Nov 26 1819  Thu Nov 25, 2017  1643 Back and reexamined the patient.  He is tolerating a little bit of p.o. now for this far as fluids.  His pressure still remain between the 90s and 100s.  He is getting IV fluids and potassium repletion.  I think it would be reasonable to admit him to the hospital overnight for at  least observation until he can take adequate p.o.   [MB]  1649 Discussed with Dr. Shanon Brow from the hospitalist service.  She is asking if we can get a lactate and a chest x-ray and start him on empiric antibiotics Cipro and Flagyl.   [MB]    Clinical Course User Index [MB] Hayden Rasmussen, MD      Final Clinical Impressions(s) / ED Diagnoses   Final diagnoses:  Diarrhea, unspecified type  Dehydration    ED Discharge Orders    None       Hayden Rasmussen, MD 11/25/17 [place] [place]

## 2017-11-25 NOTE — ED Notes (Signed)
Dr. Lacinda Axon made aware of pt.

## 2017-11-25 NOTE — Progress Notes (Addendum)
ANTICOAGULATION CONSULT NOTE - Initial Consult  Pharmacy Consult for  warfarin Indication: atrial fibrillation  Goal INR :2- 3 INR today: 3.38 Home dose: 5mg /day (35mg  per week) Last dose: pt took 5mg  today  Concurrent anti-platelet drugs:  N/a Major drug interactions:    Allergies  Allergen Reactions  . Augmentin [Amoxicillin-Pot Clavulanate] Rash    Redness of skin    Patient Measurements: Height: 5\' 10"  (177.8 cm) Weight: 154 lb 5.2 oz (70 kg) IBW/kg (Calculated) : 73 Heparin Dosing Weight:   Vital Signs: Temp: 98.1 F (36.7 C) (08/08 1805) Temp Source: Oral (08/08 1805) BP: 119/68 (08/08 1828) Pulse Rate: 78 (08/08 1828)  Labs: Recent Labs    11/23/17 11/25/17 1141 11/25/17 1221  HGB  --  13.6  --   HCT  --  38.3*  --   PLT  --  134*  --   LABPROT  --   --  33.9*  INR 2.1  --  3.38  CREATININE  --  1.06  --     Estimated Creatinine Clearance: 44.9 mL/min (by C-G formula based on SCr of 1.06 mg/dL).   Medical History: Past Medical History:  Diagnosis Date  . Acute bronchitis   . Arthritis   . Atrial fibrillation ([place])    Dr. Harl Bowie- [facility] follows saw 11'14  . Bladder stones    tx. with oral meds and antibiotics, now surgery planned  . Cataracts, both eyes    surgery planned May 2015  . Diabetes mellitus without complication ([place])    Type II  . Dyspnea    with activity  . Dysrhythmia    Afib  . Family history of breast cancer   . Family history of colon cancer   . GERD (gastroesophageal reflux disease)   . 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 Margot Chimes, thus far without recurrence.   . Hypertension   . NHL (non-Hodgkin's lymphoma) ([place]) 2005   Diffuse large B-cell lymphoma, clinically stage IIIA, CD20  positive, status post cervical lymph node biopsy 08/03/2003 on the left. PET scan was also positive in the spleen and small bowel region, but bone marrow aspiration and biopsy were negative. So he essentially had stage IIIAs. He received R-CHOP x6 cycles with CR established by PET scan criteria on 11/23/2003 with no evidence for relapse th  . PAD (peripheral artery disease) ([place]) 08/27/2017  . Skin cancer    Basal cell- face,head, neck,  arms, legs, Back    Medications:  Medications Prior to Admission  Medication Sig Dispense Refill Last Dose  . abiraterone acetate (ZYTIGA) 250 MG tablet Take 4 tablets (1,000 mg total) by mouth daily. Take on an empty stomach 1 hour before or 2 hours after a meal 120 tablet 6 11/24/2017 at Unknown time  . acetaminophen (TYLENOL) 500 MG tablet Take 500-1,000 mg by mouth daily as needed for mild pain, fever or headache.   Taking  . amitriptyline (ELAVIL) 25 MG tablet Take 25 mg by mouth at bedtime.   11/24/2017 at Unknown time  . clotrimazole-betamethasone (LOTRISONE) cream Apply 1 application topically daily as needed (for fungus).    Taking  . CRANBERRY PO Take 4,200 mg by mouth daily.   11/24/2017 at Unknown time  . cyanocobalamin (,VITAMIN B-12,) 1000 MCG/ML injection Inject 1,000  mcg every 30 (thirty) days into the skin.   Past Month at Unknown time  . denosumab (XGEVA) 120 MG/1.7ML SOLN injection Inject 120 mg into the skin every 30 (thirty) days.    Past Month at Unknown time  . diltiazem (CARDIZEM) 90 MG tablet TAKE 1 TABLET BY MOUTH TWICE DAILY 60 tablet 0 11/25/2017 at Unknown time  . diphenhydrAMINE (BENADRYL) 25 MG tablet Take 25 mg by mouth every 6 (six) hours as needed for itching.   Taking  . docusate sodium (COLACE) 100 MG capsule Take 100 mg by mouth daily as needed for mild constipation.   Taking  . furosemide (LASIX) 20 MG tablet Take 1 tablet (20 mg total) by mouth daily. 30 tablet 11 11/24/2017 at Unknown time  . glipiZIDE (GLUCOTROL XL) 5 MG 24 hr tablet  Take 5 mg by mouth daily with breakfast.    11/24/2017 at Unknown time  . Leuprolide Acetate, 6 Month, (LUPRON DEPOT) 45 MG injection Inject 45 mg into the muscle every 6 (six) months.   Taking  . lisinopril (PRINIVIL,ZESTRIL) 20 MG tablet Take 1 tablet (20 mg total) daily by mouth. 30 tablet 11 11/25/2017 at Unknown time  . loperamide (IMODIUM) 2 MG capsule Take 2 mg by mouth as needed for diarrhea or loose stools.   11/25/2017 at Unknown time  . metFORMIN (GLUCOPHAGE) 850 MG tablet Take 500 mg by mouth 2 (two) times daily with a meal.    11/24/2017 at Unknown time  . mirtazapine (REMERON) 15 MG tablet Take 15 mg by mouth at bedtime.   11/24/2017 at Unknown time  . nystatin (MYCOSTATIN) powder Apply 1 g topically 4 (four) times daily as needed (fungus).    Taking  . omeprazole (PRILOSEC) 20 MG capsule Take 40 mg by mouth daily.    11/24/2017 at Unknown time  . phenylephrine-shark liver oil-mineral oil-petrolatum (PREPARATION H) 0.25-3-14-71.9 % rectal ointment Place 1 application rectally 2 (two) times daily as needed (for chaffing).   Taking  . potassium chloride (K-DUR,KLOR-CON) 10 MEQ tablet Take 2 tablets (20 mEq total) by mouth daily. 60 tablet 0 11/24/2017 at Unknown time  . predniSONE (DELTASONE) 5 MG tablet Take 1 tablet (5 mg total) by mouth 2 (two) times daily with a meal. (Patient taking differently: Take 2.5 mg by mouth 2 (two) times daily with a meal. ) 30 tablet 2 11/24/2017 at Unknown time  . sennosides-docusate sodium (SENOKOT-S) 8.6-50 MG tablet Take 1 tablet by mouth daily as needed for constipation.    Taking  . VENTOLIN HFA 108 (90 Base) MCG/ACT inhaler Inhale 1-2 puffs into the lungs every 6 (six) hours as needed for wheezing or shortness of breath.    11/24/2017 at Unknown time  . warfarin (COUMADIN) 2.5 MG tablet Take 2 tablets daily except 1 tablet on Tuesdays and Fridays or as directed (Patient taking differently: Take 2.5-5 mg by mouth See admin instructions. Take 2 tablets daily except 1  tablet on  Fridays or as directed) 60 tablet 6 11/25/2017 at 700  . CALCIUM CARB-CHOLECALCIFEROL PO Take 1 tablet by mouth daily.    Taking    Assessment: 86 yom on chronic warfarin therapy for a. Fib. Pt has been hypotensive and weak and has experienced  ~2 days' worth of diarrhea.  Goal of Therapy:  INR 2-3 Monitor platelets by anticoagulation protocol: Yes   Plan:  Will start warfarin tomorrow , as patient has taken his 5-mg dose today INR and CBC daily     Jonelle Sidle  Bjorn Loser, Pharm. D. Clinical Pharmacist 11/25/2017 9:23 PM

## 2017-11-25 NOTE — H&P (Signed)
History and Physical    [name] FKC:[id number] DOB: [birth year] DOA: 11/25/2017  PCP: Rosita Fire, MD  Patient coming from: Home  Chief Complaint: Weakness and diarrhea  HPI: [name] is a 84 y.o. male with medical history significant of A. fib on Coumadin, hypertension, diabetes, went to see his primary care physician today for feeling weak was found to be hypotensive and sent to the ED.  He has been having a lot of diarrhea for over 48 hours.  He has not had any fevers or any abdominal pain.  He denies any urinary symptoms but also reports that he is hardly urinated at all.  He has had markedly decreased p.o. intake.  Wife reports that about 2 weeks ago he was told he had a possible UTI but was not treated as at that time he was asymptomatic.  Today patient was found to be hypotensive with systolics in the 74B and PCPs office so therefore sent to the emergency department.  Patient has been given a liter and a half of fluids and his blood pressures are improved in the 90s.  Patient found to have a UTI and stool cultures are pending.  Patient referred for admission for sepsis secondary to unclear source.  Review of Systems: As per HPI otherwise 10 point review of systems negative.   Past Medical History:  Diagnosis Date  . Acute bronchitis   . Arthritis   . Atrial fibrillation ([place])    Dr. Harl Bowie- [facility] follows saw 11'14  . Bladder stones    tx. with oral meds and antibiotics, now surgery planned  . Cataracts, both eyes    surgery planned May 2015  . Diabetes mellitus without complication ([place])    Type II  . Dyspnea    with activity  . Dysrhythmia    Afib  . Family history of breast cancer   . Family history of colon cancer   . GERD (gastroesophageal reflux disease)   . 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 Margot Chimes, thus far without recurrence.   . Hypertension   . NHL (non-Hodgkin's lymphoma) ([place]) 2005   Diffuse large B-cell lymphoma, clinically stage IIIA, CD20 positive, status post cervical lymph node biopsy 08/03/2003 on the left. PET scan was also positive in the spleen and small bowel region, but bone marrow aspiration and biopsy were negative. So he essentially had stage IIIAs. He received R-CHOP x6 cycles with CR established by PET scan criteria on 11/23/2003 with no evidence for relapse th  . PAD (peripheral artery disease) ([place]) 08/27/2017  . Skin cancer    Basal cell- face,head, neck,  arms, legs, Back    Past Surgical History:  Procedure Laterality Date  . AMPUTATION Left 06/29/2017   Procedure: AMPUTATION LEFT GREAT TOE;  Surgeon: [name], MD;  Location: [facility];  Service: Vascular;  Laterality: Left;  . AMPUTATION Left 08/27/2017   Procedure: AMPUTATION Left SECOND TOE;  Surgeon: [name], MD;  Location: [facility];  Service: Vascular;  Laterality: Left;  . CATARACT EXTRACTION Bilateral 2015  . CHOLECYSTECTOMY    . COLON RESECTION     small bowel  . CYSTOSCOPY WITH LITHOLAPAXY N/A 08/10/2013   Procedure: CYSTOSCOPY WITH LITHOLAPAXY WITH [name];  Surgeon: [name], MD;  Location:  [facility];  Service: Urology;  Laterality: N/A;  . ESOPHAGEAL DILATION N/A 02/27/2016   Procedure: ESOPHAGEAL DILATION;  Surgeon: [name], MD;  [facility];  Service: Endoscopy;  Laterality: N/A;  . ESOPHAGOGASTRODUODENOSCOPY N/A 02/27/2016   Procedure: ESOPHAGOGASTRODUODENOSCOPY (EGD);  Surgeon: [name], MD;  [facility];  Service: Endoscopy;  Laterality: N/A;  12:00  . INCISION AND DRAINAGE PERIRECTAL ABSCESS    . LOWER EXTREMITY ANGIOGRAPHY N/A 06/25/2017   Procedure: LOWER EXTREMITY ANGIOGRAPHY;  Surgeon: [name], MD;  Location: [facility];  Service:  Cardiovascular;  Laterality: N/A;  . LYMPH NODE DISSECTION Left    '05-neck  . PORT-A-CATH REMOVAL    . PORTACATH PLACEMENT     insertion and removal -last chemotherapy 10 yrs ago  . TRANSURETHRAL RESECTION OF PROSTATE N/A 08/10/2013   Procedure: TRANSURETHRAL RESECTION OF THE PROSTATE WITH GYRUS INSTRUMENTS;  Surgeon: [name], MD;  Location: [facility];  Service: Urology;  Laterality: N/A;     reports that he has never smoked. He has never used smokeless tobacco. He reports that he does not drink alcohol or use drugs.  Allergies  Allergen Reactions  . Augmentin [Amoxicillin-Pot Clavulanate] Rash    Redness of skin    Family History  Problem Relation Age of Onset  . Heart attack Mother        died in her 55s  . Other Father 27       pneumonia - died  . Breast cancer Sister        dx in her 60s-60s  . Colon cancer Brother        dx in his 62s  . Cancer Sister        TAH/BSO due to "male cancer"  . Breast cancer Daughter 41  . Breast cancer Daughter 47       DCIS - ATM VUS on [employer] 83 gene panel in 2018  . Breast cancer Daughter 106       DCIS - Negative on [employer] My[employer] 25 gene apnel in 2015  . Thyroid cancer Daughter 20       Medullary and Papillary  . Thyroid nodules Grandchild     Prior to Admission medications   Medication Sig Start Date End Date Taking? Authorizing Provider  abiraterone acetate (ZYTIGA) 250 MG tablet Take 4 tablets (1,000 mg total) by mouth daily. Take on an empty stomach 1 hour before or 2 hours after a meal 04/21/17  Yes Burns, Wandra Feinstein, NP  acetaminophen (TYLENOL) 500 MG tablet Take 500-1,000 mg by mouth daily as needed for mild pain, fever or headache.   Yes [provider]  amitriptyline (ELAVIL) 25 MG tablet Take 25 mg by mouth at bedtime.   Yes [provider]  clotrimazole-betamethasone (LOTRISONE) cream Apply 1 application topically daily as needed (for fungus).  06/18/15  Yes [provider]  CRANBERRY PO  Take 4,200 mg by mouth daily.   Yes [provider]  cyanocobalamin (,VITAMIN B-12,) 1000 MCG/ML injection Inject 1,000 mcg every 30 (thirty) days into the skin. 02/25/17  Yes [provider]  denosumab (XGEVA) 120 MG/1.7ML SOLN injection Inject 120 mg into the skin every 30 (thirty) days.    Yes [provider]  diltiazem (CARDIZEM) 90 MG tablet TAKE 1 TABLET BY MOUTH TWICE DAILY 11/01/17  Yes Branch, Alphonse Guild, MD  diphenhydrAMINE (BENADRYL) 25 MG tablet Take 25 mg by mouth every 6 (six) hours as needed for itching.  Yes [provider]  docusate sodium (COLACE) 100 MG capsule Take 100 mg by mouth daily as needed for mild constipation.   Yes [provider]  furosemide (LASIX) 20 MG tablet Take 1 tablet (20 mg total) by mouth daily. 03/18/17 03/18/18 Yes Kathie Dike, MD  glipiZIDE (GLUCOTROL XL) 5 MG 24 hr tablet Take 5 mg by mouth daily with breakfast.  06/25/15  Yes [provider]  Leuprolide Acetate, 6 Month, (LUPRON DEPOT) 45 MG injection Inject 45 mg into the muscle every 6 (six) months.   Yes [provider]  lisinopril (PRINIVIL,ZESTRIL) 20 MG tablet Take 1 tablet (20 mg total) daily by mouth. 02/25/17  Yes Sater, [name], MD  loperamide (IMODIUM) 2 MG capsule Take 2 mg by mouth as needed for diarrhea or loose stools.   Yes [provider]  metFORMIN (GLUCOPHAGE) 850 MG tablet Take 500 mg by mouth 2 (two) times daily with a meal.  03/23/17  Yes [provider]  mirtazapine (REMERON) 15 MG tablet Take 15 mg by mouth at bedtime.   Yes [provider]  nystatin (MYCOSTATIN) powder Apply 1 g topically 4 (four) times daily as needed (fungus).    Yes [provider]  omeprazole (PRILOSEC) 20 MG capsule Take 40 mg by mouth daily.    Yes [provider]  phenylephrine-shark liver oil-mineral oil-petrolatum (PREPARATION H) 0.25-3-14-71.9 % rectal ointment Place 1 application rectally 2 (two)  times daily as needed (for chaffing).   Yes [provider]  potassium chloride (K-DUR,KLOR-CON) 10 MEQ tablet Take 2 tablets (20 mEq total) by mouth daily. 06/21/17  Yes Higgs, Mathis Dad, MD  predniSONE (DELTASONE) 5 MG tablet Take 1 tablet (5 mg total) by mouth 2 (two) times daily with a meal. Patient taking differently: Take 2.5 mg by mouth 2 (two) times daily with a meal.  05/17/17  Yes Holley Bouche, NP  sennosides-docusate sodium (SENOKOT-S) 8.6-50 MG tablet Take 1 tablet by mouth daily as needed for constipation.    Yes [provider]  VENTOLIN HFA 108 (90 Base) MCG/ACT inhaler Inhale 1-2 puffs into the lungs every 6 (six) hours as needed for wheezing or shortness of breath.  03/08/17  Yes [provider]  warfarin (COUMADIN) 2.5 MG tablet Take 2 tablets daily except 1 tablet on Tuesdays and Fridays or as directed Patient taking differently: Take 2.5-5 mg by mouth See admin instructions. Take 2 tablets daily except 1 tablet on  Fridays or as directed 08/11/17  Yes Branch, Alphonse Guild, MD  CALCIUM CARB-CHOLECALCIFEROL PO Take 1 tablet by mouth daily.     [provider]    Physical Exam: Vitals:   11/25/17 1630 11/25/17 1700 11/25/17 1730 11/25/17 1745  BP: 93/68 117/70 113/67   Pulse: 76 87 79 85  Resp: 15 20 20 19   Temp:      TempSrc:      SpO2: 94% 95% 95% 95%  Weight:      Height:          Constitutional: NAD, calm, comfortable Vitals:   11/25/17 1630 11/25/17 1700 11/25/17 1730 11/25/17 1745  BP: 93/68 117/70 113/67   Pulse: 76 87 79 85  Resp: 15 20 20 19   Temp:      TempSrc:      SpO2: 94% 95% 95% 95%  Weight:      Height:       Eyes: PERRL, lids and conjunctivae normal ENMT: Mucous membranes are moist. Posterior pharynx clear of  any exudate or lesions.Normal dentition.  Neck: normal, supple, no masses, no thyromegaly Respiratory: clear to auscultation bilaterally, no wheezing, no crackles. Normal respiratory effort. No accessory  muscle use.  Cardiovascular: Regular rate and rhythm, no murmurs / rubs / gallops. No extremity edema. 2+ pedal pulses. No carotid bruits.  Abdomen: no tenderness, no masses palpated. No hepatosplenomegaly. Bowel sounds positive.  Musculoskeletal: no clubbing / cyanosis. No joint deformity upper and lower extremities. Good ROM, no contractures. Normal muscle tone.  Skin: no rashes, lesions, ulcers. No induration Neurologic: CN 2-12 grossly intact. Sensation intact, DTR normal. Strength 5/5 in all 4.  Psychiatric: Normal judgment and insight. Alert and oriented x 3. Normal mood.    Labs on Admission: I have personally reviewed following labs and imaging studies  CBC: Recent Labs  Lab 11/25/17 1141  WBC 8.4  HGB 13.6  HCT 38.3*  MCV 87.2  PLT 277*   Basic Metabolic Panel: Recent Labs  Lab 11/25/17 1141  NA 136  K 3.0*  CL 106  CO2 21*  GLUCOSE 167*  BUN 24*  CREATININE 1.06  CALCIUM 8.1*   GFR: Estimated Creatinine Clearance: 44.2 mL/min (by C-G formula based on SCr of 1.06 mg/dL). Liver Function Tests: Recent Labs  Lab 11/25/17 1141  AST 25  ALT 15  ALKPHOS 58  BILITOT 0.6  PROT 6.0*  ALBUMIN 3.2*   Recent Labs  Lab 11/25/17 1141  LIPASE 17   No results for input(s): AMMONIA in the last 168 hours. Coagulation Profile: Recent Labs  Lab 11/23/17 11/25/17 1221  INR 2.1 3.38   Cardiac Enzymes: No results for input(s): CKTOTAL, CKMB, CKMBINDEX, TROPONINI in the last 168 hours. BNP (last 3 results) No results for input(s): PROBNP in the last 8760 hours. HbA1C: No results for input(s): HGBA1C in the last 72 hours. CBG: No results for input(s): GLUCAP in the last 168 hours. Lipid Profile: No results for input(s): CHOL, HDL, LDLCALC, TRIG, CHOLHDL, LDLDIRECT in the last 72 hours. Thyroid Function Tests: No results for input(s): TSH, T4TOTAL, FREET4, T3FREE, THYROIDAB in the last 72 hours. Anemia Panel: No results for input(s): VITAMINB12, FOLATE,  FERRITIN, TIBC, IRON, RETICCTPCT in the last 72 hours. Urine analysis:    Component Value Date/Time   COLORURINE YELLOW 11/25/2017 1600   APPEARANCEUR CLOUDY (A) 11/25/2017 1600   LABSPEC 1.032 (H) 11/25/2017 1600   PHURINE 5.0 11/25/2017 1600   GLUCOSEU NEGATIVE 11/25/2017 1600   HGBUR SMALL (A) 11/25/2017 1600   BILIRUBINUR NEGATIVE 11/25/2017 1600   KETONESUR NEGATIVE 11/25/2017 1600   PROTEINUR NEGATIVE 11/25/2017 1600   NITRITE POSITIVE (A) 11/25/2017 1600   LEUKOCYTESUR LARGE (A) 11/25/2017 1600   Sepsis Labs: !!!!!!!!!!!!!!!!!!!!!!!!!!!!!!!!!!!!!!!!!!!! '@LABRCNTIP'$ (procalcitonin:4,lacticidven:4) )No results found for this or any previous visit (from the past 240 hour(s)).   Radiological Exams on Admission: Dg Chest 1 View  Result Date: 11/25/2017 CLINICAL DATA:  Diarrhea x 2 days, weakness. History of skin cancer, DM, HTN, A-FIB, PAD, GERD, dysrhythmia. EXAM: CHEST  1 VIEW COMPARISON:  Chest x-ray dated 03/16/2017. FINDINGS: Bibasilar opacities, LEFT greater than RIGHT. Stable cardiomegaly. Aortic atherosclerosis. No acute or suspicious osseous finding. IMPRESSION: 1. New bibasilar opacities, LEFT greater than RIGHT. These could represent pneumonia, atelectasis or asymmetric edema. 2. Stable cardiomegaly. 3. Aortic atherosclerosis. Electronically Signed   By: Franki Cabot M.D.   On: 11/25/2017 17:14   Ct Abdomen Pelvis W Contrast  Result Date: 11/25/2017 CLINICAL DATA:  Generalized abdominal pain, RIGHT lower quadrant abdominal pain, diarrhea, history gest, non-Hodgkin's lymphoma, atrial  fibrillation, bladder calculi, type II diabetes mellitus, hypertension EXAM: CT ABDOMEN AND PELVIS WITH CONTRAST TECHNIQUE: Multidetector CT imaging of the abdomen and pelvis was performed using the standard protocol following bolus administration of intravenous contrast. Sagittal and coronal MPR images reconstructed from axial data set. CONTRAST:  184m ISOVUE-300 IOPAMIDOL (ISOVUE-300) INJECTION 61%  IV. No oral contrast. COMPARISON:  08/02/2017 FINDINGS: Lower chest: Bibasilar atelectasis Hepatobiliary: Gallbladder surgically absent.  Liver unremarkable Pancreas: Numerous pancreatic calcifications consistent with chronic calcific pancreatitis. Pancreatic atrophy. Cystic lesion at pancreatic tail 16 x 12 mm image 26 unchanged. Minimal cystic change at pancreatic head appears unchanged. Spleen: Normal appearance Adrenals/Urinary Tract: Adrenal glands normal appearance. Cortical scarring RIGHT kidney. Kidneys and ureters otherwise normal appearance. Nodular thickening from tiny diverticula at the anterior superior bladder wall unchanged since previous study as well as an earlier exam from 10/05/2014. Stomach/Bowel: Question anorectal wall thickening. Scattered fluid throughout colon consistent with history of diarrhea. Normal appendix. Stomach decompressed, with suboptimal assessment of gastric wall thickness. Small bowel loops unremarkable. Vascular/Lymphatic: Atherosclerotic calcifications aorta and iliac arteries. Aorta normal caliber. Coronary arterial calcification noted. No adenopathy. Reproductive: Unremarkable Other: Small LEFT inguinal hernia containing fat. No free air or free fluid. No definite inflammatory process. Musculoskeletal: Degenerative disc disease changes lumbar spine. IMPRESSION: Tiny bladder diverticula. Chronic calcific pancreatitis. Stable cystic lesion at pancreatic tail 16 x 12 mm and minimal cystic change at pancreatic head stable. Question anorectal wall thickening; correlation with proctoscopy recommended to exclude inflammation and mass. Small LEFT inguinal hernia. Aortic Atherosclerosis (ICD10-I70.0). Electronically Signed   By: MLavonia DanaM.D.   On: 11/25/2017 14:44    EKG: Independently reviewed.  A. Fib Chest x-ray reviewed bibasilar infiltrates likely Case discussed with Dr. LLaverta Baltimorein the ED Old chart reviewed  Assessment/Plan 84year old male comes in with generalized  weakness and diarrhea Principal Problem:   Diarrhea-stool cultures are pending.  Covering empirically broadly initially due to unclear source.  Likely also septic with hypotension which is resolving with IV fluids.  CT negative for anything acute and abdomen suspect diarrhea likely due to bladder infection.  Active Problems:   Acute lower UTI-placing on IV vancomycin and cefepime.  Initially was given Cipro and Flagyl to cover GI source however it appears he may have UTI plus or minus pneumonia after appropriate work-up has been done in the ED    Essential hypertension-holding blood pressure medications at this time secondary to hypotension on admission    ATRIAL FIBRILLATION-currently rate controlled and holding blood pressure medications at this time    NHL (non-Hodgkin's lymphoma) (HCC)-noted  Possible pneumonia-again placed on Vanco and cefepime.  Follow-up on blood cultures and sputum culture.     DVT prophylaxis: On Coumadin Code Status: Full Family Communication: Wife Disposition Plan: 1 to 3 days Consults called: None Admission status: Admission   Fayelynn Distel A MD [facility]  If 7PM-7AM, please contact night-coverage [email or web address] Password [facility] 11/25/2017, 6:04 PM

## 2017-11-25 NOTE — ED Notes (Signed)
Pt cannot provide urine sample at time. Urinal left at beside.

## 2017-11-26 ENCOUNTER — Other Ambulatory Visit (HOSPITAL_COMMUNITY): Payer: Self-pay | Admitting: *Deleted

## 2017-11-26 DIAGNOSIS — N390 Urinary tract infection, site not specified: Secondary | ICD-10-CM

## 2017-11-26 DIAGNOSIS — C61 Malignant neoplasm of prostate: Secondary | ICD-10-CM

## 2017-11-26 DIAGNOSIS — A09 Infectious gastroenteritis and colitis, unspecified: Secondary | ICD-10-CM

## 2017-11-26 LAB — GLUCOSE, CAPILLARY
Glucose-Capillary: 64 mg/dL — ABNORMAL LOW (ref 70–99)
Glucose-Capillary: 139 mg/dL — ABNORMAL HIGH (ref 70–99)
Glucose-Capillary: 94 mg/dL (ref 70–99)
Glucose-Capillary: 173 mg/dL — ABNORMAL HIGH (ref 70–99)

## 2017-11-26 LAB — BASIC METABOLIC PANEL
Anion gap: 8 (ref 5–15)
BUN: 17 mg/dL (ref 8–23)
CO2: 19 mmol/L — ABNORMAL LOW (ref 22–32)
Calcium: 7.2 mg/dL — ABNORMAL LOW (ref 8.9–10.3)
Chloride: 114 mmol/L — ABNORMAL HIGH (ref 98–111)
Creatinine, Ser: 0.72 mg/dL (ref 0.61–1.24)
GFR calc Af Amer: >60 mL/min (ref >60)
GFR calc non Af Amer: >60 mL/min (ref >60)
Glucose, Bld: 93 mg/dL (ref 70–99)
Potassium: 3.4 mmol/L — ABNORMAL LOW (ref 3.5–5.1)
Sodium: 141 mmol/L (ref 135–145)

## 2017-11-26 LAB — CBC
HCT: 37.5 % — ABNORMAL LOW (ref 39.0–52.0)
Hemoglobin: 13.1 g/dL (ref 13.0–17.0)
MCH: 30.5 pg (ref 26.0–34.0)
MCHC: 34.9 g/dL (ref 30.0–36.0)
MCV: 87.4 fL (ref 78.0–100.0)
Platelets: 122 10*3/uL — ABNORMAL LOW (ref 150–400)
RBC: 4.29 MIL/uL (ref 4.22–5.81)
RDW: 13.5 % (ref 11.5–15.5)
WBC: 4.6 10*3/uL (ref 4.0–10.5)

## 2017-11-26 LAB — PROTIME-INR
INR: 3.71
Prothrombin Time: 36.5 seconds — ABNORMAL HIGH (ref 11.4–15.2)

## 2017-11-26 MED ORDER — ABIRATERONE ACETATE 250 MG PO TABS: 1000.0000 mg | ORAL_TABLET | Freq: Every day | ORAL | 6 refills | Status: DC

## 2017-11-26 MED ORDER — VANCOMYCIN HCL IN DEXTROSE 1-5 GM/200ML-% IV SOLN: 1000.0000 mg | INTRAVENOUS | Status: DC

## 2017-11-26 MED ORDER — HYDRALAZINE HCL 20 MG/ML IJ SOLN
10.0000 mg | Freq: Once | INTRAMUSCULAR | Status: AC
  Administered 2017-11-26: 10 mg via INTRAVENOUS
  Filled 2017-11-26: qty 1

## 2017-11-26 MED ORDER — VANCOMYCIN HCL IN DEXTROSE 1-5 GM/200ML-% IV SOLN
1000.0000 mg | INTRAVENOUS | Status: DC
  Administered 2017-11-26 – 2017-11-27 (×2): 1000 mg via INTRAVENOUS
  Filled 2017-11-26 (×2): qty 200

## 2017-11-26 NOTE — Care Management Important Message (Signed)
Important Message  Patient Details  Name: [name] MRN: [id number] Date of Birth: 09/12/1923   Medicare Important Message Given:  Yes    Shelda Altes 11/26/2017, 11:31 AM

## 2017-11-26 NOTE — Progress Notes (Signed)
ANTICOAGULATION CONSULT NOTE - Initial Consult  Pharmacy Consult for:  Warfarin dosing Indication: atrial fibrillation  Goal INR :2- 3 INR today: 3.71 Home dose: 5mg /day (35mg  per week) Last dose: pt took 5mg  on 11/25/17 Concurrent anti-platelet drugs:  N/a Major drug interactions:     Patient Measurements: Height: 5\' 10"  (177.8 cm) Weight: 259 lb 7.7 oz (117.7 kg) IBW/kg (Calculated) : 73 Heparin Dosing Weight:   Vital Signs: Temp: 98.1 F (36.7 C) (08/09 0531) Temp Source: Oral (08/09 0531) BP: 123/86 (08/09 0531) Pulse Rate: 51 (08/09 0531)  Labs: Recent Labs    11/25/17 1141 11/25/17 1221 11/26/17 0511  HGB 13.6  --  13.1  HCT 38.3*  --  37.5*  PLT 134*  --  122*  LABPROT  --  33.9* 36.5*  INR  --  3.38 3.71  CREATININE 1.06  --  0.72    Estimated Creatinine Clearance: 77.3 mL/min (by C-G formula based on SCr of 0.72 mg/dL).      Assessment: 54 yom on chronic warfarin therapy for a. Fib. Pt has been hypotensive and weak and has experienced  ~2 days' worth of diarrhea. INR today is 3.71 (supra-therapeutic).   Goal of Therapy:  INR 2-3 Monitor platelets by anticoagulation protocol: Yes   Plan:  HOLD  warfarin today Will re-start when INR <3/0 INR and CBC daily     Despina Pole, Pharm. D. Clinical Pharmacist 11/26/2017 11:34 AM

## 2017-11-26 NOTE — Progress Notes (Signed)
PROGRESS NOTE    [name]  ZDG:[id number] DOB: 1924/03/01 DOA: 11/25/2017 PCP: Rosita Fire, MD     Brief Narrative:  84 year old man admitted from home on 8/8 due to diarrhea.  8 out of 10 family members have also been sick with diarrhea and emesis.  On arrival to the ED he was noted to be hypotensive with a systolic blood pressure in the 80s, was also noted to have a UTI and possible pneumonia on chest x-ray.  Admission was requested for further evaluation and management.   Assessment & Plan:   Principal Problem:   Diarrhea Active Problems:   Essential hypertension   ATRIAL FIBRILLATION   NHL (non-Hodgkin's lymphoma) (HCC)   Acute lower UTI   Diarrhea -Has already tapered down, suspect viral gastroenteritis given multiple family members affected. -Symptomatic management.  UTI -Continue cefepime pending culture data.  Pneumonia, presumed hospital-acquired versus aspiration -Agree with continued broad-spectrum antibiotics for another 24 hours given severity on presentation. -Suspect will be able to transition to p.o. in the morning.  Sepsis due to pneumonia and UTI and also possibly viral gastroenteritis -Sepsis parameters have improved with IV fluids and antibiotics.  Antibiotic management as above.  Hypertension -Agree with continuing to hold blood pressure medications today given hypotension on admission.  Atrial fibrillation -Currently rate controlled, not anticoagulated unclear of reason but likely due to advanced age.     DVT prophylaxis: Lovenox Code Status: Full code Family Communication: Daughter at bedside updated on plan of care and all questions answered Disposition Plan: Would anticipate discharge home in 24 to 48 hours pending clinical improvement  Consultants:   None  Procedures:   None  Antimicrobials:  Anti-infectives (From admission, onward)   Start     Dose/Rate Route Frequency Ordered Stop   11/26/17 1800  vancomycin (VANCOCIN)  IVPB 1000 mg/200 mL premix  Status:  Discontinued     1,000 mg 200 mL/hr over 60 Minutes Intravenous Every 24 hours 11/26/17 0743 11/26/17 0919   11/26/17 1800  vancomycin (VANCOCIN) IVPB 1000 mg/200 mL premix     1,000 mg 200 mL/hr over 60 Minutes Intravenous Every 24 hours 11/26/17 0937     11/25/17 2100  ceFEPIme (MAXIPIME) 1 g in sodium chloride 0.9 % 100 mL IVPB     1 g 200 mL/hr over 30 Minutes Intravenous Every 12 hours 11/25/17 2019     11/25/17 2100  vancomycin (VANCOCIN) 1,500 mg in sodium chloride 0.9 % 500 mL IVPB     1,500 mg 250 mL/hr over 120 Minutes Intravenous  Once 11/25/17 2021 11/26/17 0000   11/25/17 2000  ciprofloxacin (CIPRO) IVPB 400 mg  Status:  Discontinued     400 mg 200 mL/hr over 60 Minutes Intravenous Every 12 hours 11/25/17 1808 11/25/17 1935   11/25/17 1700  ciprofloxacin (CIPRO) IVPB 400 mg  Status:  Discontinued     400 mg 200 mL/hr over 60 Minutes Intravenous  Once 11/25/17 1649 11/25/17 1832   11/25/17 1700  metroNIDAZOLE (FLAGYL) IVPB 500 mg     500 mg 100 mL/hr over 60 Minutes Intravenous  Once 11/25/17 1649 11/25/17 1805       Subjective: Lying in bed, states he feels significantly improved, no further diarrhea since admission, denies shortness of breath or dysuria.  Objective: Vitals:   11/25/17 1828 11/25/17 1956 11/25/17 2142 11/26/17 0531  BP: 119/68  135/84 123/86  Pulse: 78  98 (!) 51  Resp: 18  19 18   Temp:   98.3 F (  36.8 C) 98.1 F (36.7 C)  TempSrc:   Oral Oral  SpO2: 98% 94% 97% 92%  Weight:    117.7 kg  Height:        Intake/Output Summary (Last 24 hours) at 11/26/2017 1412 Last data filed at 11/26/2017 1200 Gross per 24 hour  Intake 1478.33 ml  Output 700 ml  Net 778.33 ml   Filed Weights   11/25/17 1110 11/25/17 1826 11/26/17 0531  Weight: 68.9 kg 70 kg 117.7 kg    Examination:  General exam: Alert, awake, oriented x 3 Respiratory system: Clear to auscultation. Respiratory effort normal. Cardiovascular  system:RRR. No murmurs, rubs, gallops. Gastrointestinal system: Abdomen is nondistended, soft and nontender. No organomegaly or masses felt. Normal bowel sounds heard. Central nervous system: Alert and oriented. No focal neurological deficits. Extremities: No C/C/E, +pedal pulses Skin: No rashes, lesions or ulcers Psychiatry: Judgement and insight appear normal. Mood & affect appropriate.     Data Reviewed: I have personally reviewed following labs and imaging studies  CBC: Recent Labs  Lab 11/25/17 1141 11/26/17 0511  WBC 8.4 4.6  HGB 13.6 13.1  HCT 38.3* 37.5*  MCV 87.2 87.4  PLT 134* 161*   Basic Metabolic Panel: Recent Labs  Lab 11/25/17 1141 11/26/17 0511  NA 136 141  K 3.0* 3.4*  CL 106 114*  CO2 21* 19*  GLUCOSE 167* 93  BUN 24* 17  CREATININE 1.06 0.72  CALCIUM 8.1* 7.2*   GFR: Estimated Creatinine Clearance: 77.3 mL/min (by C-G formula based on SCr of 0.72 mg/dL). Liver Function Tests: Recent Labs  Lab 11/25/17 1141  AST 25  ALT 15  ALKPHOS 58  BILITOT 0.6  PROT 6.0*  ALBUMIN 3.2*   Recent Labs  Lab 11/25/17 1141  LIPASE 17   No results for input(s): AMMONIA in the last 168 hours. Coagulation Profile: Recent Labs  Lab 11/23/17 11/25/17 1221 11/26/17 0511  INR 2.1 3.38 3.71   Cardiac Enzymes: No results for input(s): CKTOTAL, CKMB, CKMBINDEX, TROPONINI in the last 168 hours. BNP (last 3 results) No results for input(s): PROBNP in the last 8760 hours. HbA1C: No results for input(s): HGBA1C in the last 72 hours. CBG: Recent Labs  Lab 11/25/17 2144 11/26/17 0748 11/26/17 1122  GLUCAP 88 64* 139*   Lipid Profile: No results for input(s): CHOL, HDL, LDLCALC, TRIG, CHOLHDL, LDLDIRECT in the last 72 hours. Thyroid Function Tests: No results for input(s): TSH, T4TOTAL, FREET4, T3FREE, THYROIDAB in the last 72 hours. Anemia Panel: No results for input(s): VITAMINB12, FOLATE, FERRITIN, TIBC, IRON, RETICCTPCT in the last 72 hours. Urine  analysis:    Component Value Date/Time   COLORURINE YELLOW 11/25/2017 1600   APPEARANCEUR CLOUDY (A) 11/25/2017 1600   LABSPEC 1.032 (H) 11/25/2017 1600   PHURINE 5.0 11/25/2017 1600   GLUCOSEU NEGATIVE 11/25/2017 1600   HGBUR SMALL (A) 11/25/2017 1600   BILIRUBINUR NEGATIVE 11/25/2017 1600   KETONESUR NEGATIVE 11/25/2017 1600   PROTEINUR NEGATIVE 11/25/2017 1600   NITRITE POSITIVE (A) 11/25/2017 1600   LEUKOCYTESUR LARGE (A) 11/25/2017 1600   Sepsis Labs: @LABRCNTIP (procalcitonin:4,lacticidven:4)  )No results found for this or any previous visit (from the past 240 hour(s)).       Radiology Studies: Dg Chest 1 View  Result Date: 11/25/2017 CLINICAL DATA:  Diarrhea x 2 days, weakness. History of skin cancer, DM, HTN, A-FIB, PAD, GERD, dysrhythmia. EXAM: CHEST  1 VIEW COMPARISON:  Chest x-ray dated 03/16/2017. FINDINGS: Bibasilar opacities, LEFT greater than RIGHT. Stable cardiomegaly. Aortic atherosclerosis. No  acute or suspicious osseous finding. IMPRESSION: 1. New bibasilar opacities, LEFT greater than RIGHT. These could represent pneumonia, atelectasis or asymmetric edema. 2. Stable cardiomegaly. 3. Aortic atherosclerosis. Electronically Signed   By: Franki Cabot M.D.   On: 11/25/2017 17:14   Ct Abdomen Pelvis W Contrast  Result Date: 11/25/2017 CLINICAL DATA:  Generalized abdominal pain, RIGHT lower quadrant abdominal pain, diarrhea, history gest, non-Hodgkin's lymphoma, atrial fibrillation, bladder calculi, type II diabetes mellitus, hypertension EXAM: CT ABDOMEN AND PELVIS WITH CONTRAST TECHNIQUE: Multidetector CT imaging of the abdomen and pelvis was performed using the standard protocol following bolus administration of intravenous contrast. Sagittal and coronal MPR images reconstructed from axial data set. CONTRAST:  158mL ISOVUE-300 IOPAMIDOL (ISOVUE-300) INJECTION 61% IV. No oral contrast. COMPARISON:  08/02/2017 FINDINGS: Lower chest: Bibasilar atelectasis Hepatobiliary:  Gallbladder surgically absent.  Liver unremarkable Pancreas: Numerous pancreatic calcifications consistent with chronic calcific pancreatitis. Pancreatic atrophy. Cystic lesion at pancreatic tail 16 x 12 mm image 26 unchanged. Minimal cystic change at pancreatic head appears unchanged. Spleen: Normal appearance Adrenals/Urinary Tract: Adrenal glands normal appearance. Cortical scarring RIGHT kidney. Kidneys and ureters otherwise normal appearance. Nodular thickening from tiny diverticula at the anterior superior bladder wall unchanged since previous study as well as an earlier exam from 10/05/2014. Stomach/Bowel: Question anorectal wall thickening. Scattered fluid throughout colon consistent with history of diarrhea. Normal appendix. Stomach decompressed, with suboptimal assessment of gastric wall thickness. Small bowel loops unremarkable. Vascular/Lymphatic: Atherosclerotic calcifications aorta and iliac arteries. Aorta normal caliber. Coronary arterial calcification noted. No adenopathy. Reproductive: Unremarkable Other: Small LEFT inguinal hernia containing fat. No free air or free fluid. No definite inflammatory process. Musculoskeletal: Degenerative disc disease changes lumbar spine. IMPRESSION: Tiny bladder diverticula. Chronic calcific pancreatitis. Stable cystic lesion at pancreatic tail 16 x 12 mm and minimal cystic change at pancreatic head stable. Question anorectal wall thickening; correlation with proctoscopy recommended to exclude inflammation and mass. Small LEFT inguinal hernia. Aortic Atherosclerosis (ICD10-I70.0). Electronically Signed   By: Lavonia Dana M.D.   On: 11/25/2017 14:44        Scheduled Meds: . abiraterone acetate  1,000 mg Oral Daily  . amitriptyline  25 mg Oral QHS  . cyanocobalamin  1,000 mcg Subcutaneous Q30 days  . denosumab  120 mg Subcutaneous Q30 days  . insulin aspart  0-9 Units Subcutaneous TID WC  . Leuprolide Acetate (6 Month)  45 mg Intramuscular Q6 months  .  mirtazapine  15 mg Oral QHS  . potassium chloride  20 mEq Oral Daily  . predniSONE  2.5 mg Oral BID WC   Continuous Infusions: . 0.9 % NaCl with KCl 20 mEq / L 100 mL/hr at 11/26/17 0723  . ceFEPime (MAXIPIME) IV Stopped (11/26/17 1023)  . vancomycin       LOS: 1 day    Time spent: 35 minutes. Greater than 50% of this time was spent in direct contact with the patient and with patient's daughter, coordinating care and discussing relevant ongoing clinical issues, including his improving gastroenteritis, plans to keep in the hospital another day to observe him on antibiotics and monitor blood pressure.     Lelon Frohlich, MD [facility] Pager [phone]  If 7PM-7AM, please contact night-coverage [email or web address] Password [email or web address] 11/26/2017, 2:12 PM

## 2017-11-26 NOTE — Care Management Important Message (Signed)
Important Message  Patient Details  Name: [name] MRN: [id number] Date of Birth: [birth year]   Medicare Important Message Given:  Yes Gave to [name] RN case mgr.   Holli Humbles Smith 11/26/2017, 10:55 AM

## 2017-11-27 DIAGNOSIS — I481 Persistent atrial fibrillation: Secondary | ICD-10-CM

## 2017-11-27 LAB — CBC
HCT: 35 % — ABNORMAL LOW (ref 39.0–52.0)
Hemoglobin: 12.3 g/dL — ABNORMAL LOW (ref 13.0–17.0)
MCH: 30.8 pg (ref 26.0–34.0)
MCHC: 35.1 g/dL (ref 30.0–36.0)
MCV: 87.5 fL (ref 78.0–100.0)
Platelets: 121 10*3/uL — ABNORMAL LOW (ref 150–400)
RBC: 4 MIL/uL — ABNORMAL LOW (ref 4.22–5.81)
RDW: 13.5 % (ref 11.5–15.5)
WBC: 4 10*3/uL (ref 4.0–10.5)

## 2017-11-27 LAB — GASTROINTESTINAL PANEL BY PCR, STOOL (REPLACES STOOL CULTURE)
Adenovirus F40/41: NOT DETECTED
Astrovirus: DETECTED — AB
Campylobacter species: NOT DETECTED
Cryptosporidium: NOT DETECTED
Cyclospora cayetanensis: NOT DETECTED
Entamoeba histolytica: NOT DETECTED
Enteroaggregative E coli (EAEC): NOT DETECTED
Enteropathogenic E coli (EPEC): NOT DETECTED
Enterotoxigenic E coli (ETEC): NOT DETECTED
Giardia lamblia: NOT DETECTED
Norovirus GI/GII: NOT DETECTED
Plesimonas shigelloides: NOT DETECTED
Rotavirus A: NOT DETECTED
Salmonella species: NOT DETECTED
Sapovirus (I, II, IV, and V): NOT DETECTED
Shiga like toxin producing E coli (STEC): NOT DETECTED
Shigella/Enteroinvasive E coli (EIEC): NOT DETECTED
Vibrio cholerae: NOT DETECTED
Vibrio species: NOT DETECTED
Yersinia enterocolitica: NOT DETECTED

## 2017-11-27 LAB — GLUCOSE, CAPILLARY
Glucose-Capillary: 131 mg/dL — ABNORMAL HIGH (ref 70–99)
Glucose-Capillary: 114 mg/dL — ABNORMAL HIGH (ref 70–99)
Glucose-Capillary: 137 mg/dL — ABNORMAL HIGH (ref 70–99)
Glucose-Capillary: 170 mg/dL — ABNORMAL HIGH (ref 70–99)

## 2017-11-27 LAB — BASIC METABOLIC PANEL
Anion gap: 8 (ref 5–15)
BUN: 11 mg/dL (ref 8–23)
CO2: 17 mmol/L — ABNORMAL LOW (ref 22–32)
Calcium: 7 mg/dL — ABNORMAL LOW (ref 8.9–10.3)
Chloride: 116 mmol/L — ABNORMAL HIGH (ref 98–111)
Creatinine, Ser: 0.67 mg/dL (ref 0.61–1.24)
GFR calc Af Amer: >60 mL/min (ref >60)
GFR calc non Af Amer: >60 mL/min (ref >60)
Glucose, Bld: 157 mg/dL — ABNORMAL HIGH (ref 70–99)
Potassium: 3.4 mmol/L — ABNORMAL LOW (ref 3.5–5.1)
Sodium: 141 mmol/L (ref 135–145)

## 2017-11-27 LAB — PROTIME-INR
INR: 3.4
Prothrombin Time: 34.1 seconds — ABNORMAL HIGH (ref 11.4–15.2)

## 2017-11-27 MED ORDER — LORAZEPAM 1 MG PO TABS
1.0000 mg | ORAL_TABLET | Freq: Once | ORAL | Status: AC
  Administered 2017-11-27: 1 mg via ORAL
  Filled 2017-11-27: qty 1

## 2017-11-27 MED ORDER — ABIRATERONE ACETATE 250 MG PO TABS
1000.0000 mg | ORAL_TABLET | Freq: Every day | ORAL | Status: DC
  Administered 2017-11-28: 1000 mg via ORAL

## 2017-11-27 MED ORDER — DILTIAZEM HCL 30 MG PO TABS
90.0000 mg | ORAL_TABLET | Freq: Two times a day (BID) | ORAL | Status: DC
  Administered 2017-11-27 – 2017-11-28 (×3): 90 mg via ORAL
  Filled 2017-11-27 (×3): qty 1

## 2017-11-27 MED ORDER — METOPROLOL TARTRATE 5 MG/5ML IV SOLN
5.0000 mg | Freq: Once | INTRAVENOUS | Status: AC
  Administered 2017-11-27: 5 mg via INTRAVENOUS
  Filled 2017-11-27: qty 5

## 2017-11-27 MED ORDER — FUROSEMIDE 20 MG PO TABS
20.0000 mg | ORAL_TABLET | Freq: Every day | ORAL | Status: DC
  Administered 2017-11-27 – 2017-11-28 (×2): 20 mg via ORAL
  Filled 2017-11-27 (×2): qty 1

## 2017-11-27 MED ORDER — LISINOPRIL 10 MG PO TABS
20.0000 mg | ORAL_TABLET | Freq: Every day | ORAL | Status: DC
  Administered 2017-11-27 – 2017-11-28 (×2): 20 mg via ORAL
  Filled 2017-11-27 (×2): qty 2

## 2017-11-27 NOTE — Progress Notes (Signed)
Patient confused throughout night, trying to get out of bed .  Patient pulled IV and caused it to start leaking.  IV replaced.

## 2017-11-27 NOTE — Progress Notes (Signed)
PROGRESS NOTE    [name]  XHB:[id number] DOB: 1923/12/20 DOA: 11/25/2017 PCP: Rosita Fire, MD     Brief Narrative:  84 year old man admitted from home on 8/8 due to diarrhea.  8 out of 10 family members have also been sick with diarrhea and emesis.  On arrival to the ED he was noted to be hypotensive with a systolic blood pressure in the 80s, was also noted to have a UTI and possible pneumonia on chest x-ray.  Admission was requested for further evaluation and management.   Assessment & Plan:   Principal Problem:   Diarrhea Active Problems:   Essential hypertension   ATRIAL FIBRILLATION   NHL (non-Hodgkin's lymphoma) (HCC)   Acute lower UTI   Diarrhea -Has already tapered down, suspect viral gastroenteritis given multiple family members affected. -Symptomatic management.  UTI -Will transition to augmentin. -Cx appears to not have been drawn on admission.  Pneumonia, presumed hospital-acquired versus aspiration -Transition to augmentin to cover both UTI and PNA.  Sepsis due to pneumonia and UTI and also possibly viral gastroenteritis -Sepsis parameters have resolved with IV fluids and antibiotics.  Antibiotic management as above.  Hypertension -We will restart blood pressure medications today.  Atrial fibrillation -Currently rate is not controlled, not anticoagulated unclear of reason but likely due to advanced age. -Rate controlling meds had been placed on hold on admission due to hypotension. -We will resume doses of Cardizem today and hope for improved rate control.  Hospital delirium -Patient is sundowning with increased confusion at nighttime, and not uncommon in elderly patient with infection. -Plan to discharge home as early as possible, try to avoid sedating medications.     DVT prophylaxis: Lovenox Code Status: Full code Family Communication: Two daughters at bedside updated on plan of care and all questions answered Disposition Plan: Would  anticipate discharge home in 24 to 48 hours pending clinical improvement  Consultants:   None  Procedures:   None  Antimicrobials:  Anti-infectives (From admission, onward)   Start     Dose/Rate Route Frequency Ordered Stop   11/26/17 1800  vancomycin (VANCOCIN) IVPB 1000 mg/200 mL premix  Status:  Discontinued     1,000 mg 200 mL/hr over 60 Minutes Intravenous Every 24 hours 11/26/17 0743 11/26/17 0919   11/26/17 1800  vancomycin (VANCOCIN) IVPB 1000 mg/200 mL premix     1,000 mg 200 mL/hr over 60 Minutes Intravenous Every 24 hours 11/26/17 0937     11/25/17 2100  ceFEPIme (MAXIPIME) 1 g in sodium chloride 0.9 % 100 mL IVPB     1 g 200 mL/hr over 30 Minutes Intravenous Every 12 hours 11/25/17 2019     11/25/17 2100  vancomycin (VANCOCIN) 1,500 mg in sodium chloride 0.9 % 500 mL IVPB     1,500 mg 250 mL/hr over 120 Minutes Intravenous  Once 11/25/17 2021 11/26/17 0000   11/25/17 2000  ciprofloxacin (CIPRO) IVPB 400 mg  Status:  Discontinued     400 mg 200 mL/hr over 60 Minutes Intravenous Every 12 hours 11/25/17 1808 11/25/17 1935   11/25/17 1700  ciprofloxacin (CIPRO) IVPB 400 mg  Status:  Discontinued     400 mg 200 mL/hr over 60 Minutes Intravenous  Once 11/25/17 1649 11/25/17 1832   11/25/17 1700  metroNIDAZOLE (FLAGYL) IVPB 500 mg     500 mg 100 mL/hr over 60 Minutes Intravenous  Once 11/25/17 1649 11/25/17 1805       Subjective: In bed, appears mildly confused, repeats the same statement  a few times.  Does not appear to have increased work of breathing, denies any pain, has only had one bowel movement over the past 24 hours.  Objective: Vitals:   11/27/17 0323 11/27/17 0620 11/27/17 0706 11/27/17 1419  BP: (!) 125/95 (!) 144/88  133/90  Pulse: (!) 34 (!) 124  97  Resp: (!) 22 20  18   Temp:      TempSrc:      SpO2: 93% 96%  97%  Weight:   115.3 kg   Height:        Intake/Output Summary (Last 24 hours) at 11/27/2017 1445 Last data filed at 11/27/2017  1300 Gross per 24 hour  Intake 3606.66 ml  Output 200 ml  Net 3406.66 ml   Filed Weights   11/25/17 1826 11/26/17 0531 11/27/17 0706  Weight: 70 kg 117.7 kg 115.3 kg    Examination:  General exam: Awake, mildly confused, able to answer questions appropriately. Respiratory system: Clear to auscultation. Respiratory effort normal. Cardiovascular system:RRR. No murmurs, rubs, gallops. Gastrointestinal system: Abdomen is nondistended, soft and nontender. No organomegaly or masses felt. Normal bowel sounds heard. Central nervous system: No focal neurologic deficits Extremities: No C/C/E, +pedal pulses Skin: No rashes, lesions or ulcers Psychiatry:  Mood & affect appropriate.      Data Reviewed: I have personally reviewed following labs and imaging studies  CBC: Recent Labs  Lab 11/25/17 1141 11/26/17 0511 11/27/17 0829  WBC 8.4 4.6 4.0  HGB 13.6 13.1 12.3*  HCT 38.3* 37.5* 35.0*  MCV 87.2 87.4 87.5  PLT 134* 122* 170*   Basic Metabolic Panel: Recent Labs  Lab 11/25/17 1141 11/26/17 0511 11/27/17 0829  NA 136 141 141  K 3.0* 3.4* 3.4*  CL 106 114* 116*  CO2 21* 19* 17*  GLUCOSE 167* 93 157*  BUN 24* 17 11  CREATININE 1.06 0.72 0.67  CALCIUM 8.1* 7.2* 7.0*   GFR: Estimated Creatinine Clearance: 76.5 mL/min (by C-G formula based on SCr of 0.67 mg/dL). Liver Function Tests: Recent Labs  Lab 11/25/17 1141  AST 25  ALT 15  ALKPHOS 58  BILITOT 0.6  PROT 6.0*  ALBUMIN 3.2*   Recent Labs  Lab 11/25/17 1141  LIPASE 17   No results for input(s): AMMONIA in the last 168 hours. Coagulation Profile: Recent Labs  Lab 11/23/17 11/25/17 1221 11/26/17 0511 11/27/17 0829  INR 2.1 3.38 3.71 3.40   Cardiac Enzymes: No results for input(s): CKTOTAL, CKMB, CKMBINDEX, TROPONINI in the last 168 hours. BNP (last 3 results) No results for input(s): PROBNP in the last 8760 hours. HbA1C: No results for input(s): HGBA1C in the last 72 hours. CBG: Recent Labs  Lab  11/26/17 1122 11/26/17 1644 11/26/17 2127 11/27/17 0744 11/27/17 1133  GLUCAP 139* 94 173* 131* 114*   Lipid Profile: No results for input(s): CHOL, HDL, LDLCALC, TRIG, CHOLHDL, LDLDIRECT in the last 72 hours. Thyroid Function Tests: No results for input(s): TSH, T4TOTAL, FREET4, T3FREE, THYROIDAB in the last 72 hours. Anemia Panel: No results for input(s): VITAMINB12, FOLATE, FERRITIN, TIBC, IRON, RETICCTPCT in the last 72 hours. Urine analysis:    Component Value Date/Time   COLORURINE YELLOW 11/25/2017 1600   APPEARANCEUR CLOUDY (A) 11/25/2017 1600   LABSPEC 1.032 (H) 11/25/2017 1600   PHURINE 5.0 11/25/2017 1600   GLUCOSEU NEGATIVE 11/25/2017 1600   HGBUR SMALL (A) 11/25/2017 1600   BILIRUBINUR NEGATIVE 11/25/2017 1600   KETONESUR NEGATIVE 11/25/2017 1600   PROTEINUR NEGATIVE 11/25/2017 1600   NITRITE POSITIVE (A)  11/25/2017 1600   LEUKOCYTESUR LARGE (A) 11/25/2017 1600   Sepsis Labs: @LABRCNTIP (procalcitonin:4,lacticidven:4)  ) Recent Results (from the past 240 hour(s))  Gastrointestinal Panel by PCR , Stool     Status: Abnormal   Collection Time: 11/26/17  2:00 AM  Result Value Ref Range Status   Campylobacter species NOT DETECTED NOT DETECTED Final   Plesimonas shigelloides NOT DETECTED NOT DETECTED Final   Salmonella species NOT DETECTED NOT DETECTED Final   Yersinia enterocolitica NOT DETECTED NOT DETECTED Final   Vibrio species NOT DETECTED NOT DETECTED Final   Vibrio cholerae NOT DETECTED NOT DETECTED Final   Enteroaggregative E coli (EAEC) NOT DETECTED NOT DETECTED Final   Enteropathogenic E coli (EPEC) NOT DETECTED NOT DETECTED Final   Enterotoxigenic E coli (ETEC) NOT DETECTED NOT DETECTED Final   Shiga like toxin producing E coli (STEC) NOT DETECTED NOT DETECTED Final   Shigella/Enteroinvasive E coli (EIEC) NOT DETECTED NOT DETECTED Final   Cryptosporidium NOT DETECTED NOT DETECTED Final   Cyclospora cayetanensis NOT DETECTED NOT DETECTED Final    Entamoeba histolytica NOT DETECTED NOT DETECTED Final   Giardia lamblia NOT DETECTED NOT DETECTED Final   Adenovirus F40/41 NOT DETECTED NOT DETECTED Final   Astrovirus DETECTED (A) NOT DETECTED Final   Norovirus GI/GII NOT DETECTED NOT DETECTED Final   Rotavirus A NOT DETECTED NOT DETECTED Final   Sapovirus (I, II, IV, and V) NOT DETECTED NOT DETECTED Final    Comment: Performed at [facility], [address] [place], [place] [address]         Radiology Studies: Dg Chest 1 View  Result Date: 11/25/2017 CLINICAL DATA:  Diarrhea x 2 days, weakness. History of skin cancer, DM, HTN, A-FIB, PAD, GERD, dysrhythmia. EXAM: CHEST  1 VIEW COMPARISON:  Chest x-ray dated 03/16/2017. FINDINGS: Bibasilar opacities, LEFT greater than RIGHT. Stable cardiomegaly. Aortic atherosclerosis. No acute or suspicious osseous finding. IMPRESSION: 1. New bibasilar opacities, LEFT greater than RIGHT. These could represent pneumonia, atelectasis or asymmetric edema. 2. Stable cardiomegaly. 3. Aortic atherosclerosis. Electronically Signed   By: Franki Cabot M.D.   On: 11/25/2017 17:14        Scheduled Meds: . abiraterone acetate  1,000 mg Oral Daily  . amitriptyline  25 mg Oral QHS  . cyanocobalamin  1,000 mcg Subcutaneous Q30 days  . diltiazem  90 mg Oral Q12H  . furosemide  20 mg Oral Daily  . insulin aspart  0-9 Units Subcutaneous TID WC  . lisinopril  20 mg Oral Daily  . mirtazapine  15 mg Oral QHS  . potassium chloride  20 mEq Oral Daily  . predniSONE  2.5 mg Oral BID WC   Continuous Infusions: . 0.9 % NaCl with KCl 20 mEq / L 100 mL/hr at 11/27/17 0540  . ceFEPime (MAXIPIME) IV Stopped (11/27/17 1048)  . vancomycin Stopped (11/26/17 1845)     LOS: 2 days    Time spent: 35 minutes. Greater than 50% of this time was spent in direct contact with the patient and with patient's daughters, coordinating care and discussing relevant ongoing clinical issues, including plans to keep in the  hospital another 24 hours to titrate medications for A. fib given his rapid rates.    Lelon Frohlich, MD [facility] Pager [phone]  If 7PM-7AM, please contact night-coverage [email or web address] Password [email or web address] 11/27/2017, 2:45 PM

## 2017-11-28 DIAGNOSIS — R197 Diarrhea, unspecified: Secondary | ICD-10-CM

## 2017-11-28 LAB — PROTIME-INR
INR: 2.62
Prothrombin Time: 27.8 seconds — ABNORMAL HIGH (ref 11.4–15.2)

## 2017-11-28 LAB — GLUCOSE, CAPILLARY
Glucose-Capillary: 137 mg/dL — ABNORMAL HIGH (ref 70–99)
Glucose-Capillary: 183 mg/dL — ABNORMAL HIGH (ref 70–99)

## 2017-11-28 MED ORDER — WARFARIN SODIUM 2.5 MG PO TABS: 2.5000 mg | ORAL_TABLET | Freq: Once | ORAL | Status: DC

## 2017-11-28 MED ORDER — CEPHALEXIN 500 MG PO CAPS: 500.0000 mg | ORAL_CAPSULE | Freq: Three times a day (TID) | ORAL | 0 refills | Status: AC

## 2017-11-28 MED ORDER — WARFARIN - PHARMACIST DOSING INPATIENT: Freq: Every day | Status: DC

## 2017-11-28 NOTE — Evaluation (Signed)
Physical Therapy Evaluation Patient Details Name: [name] MRN: [id number] DOB: [birth year] Today's Date: 11/28/2017   History of Present Illness  [name] is a 84 y.o. male with medical history significant of A. fib on Coumadin, hypertension, diabetes, went to see his primary care physician today for feeling weak was found to be hypotensive and sent to the ED.  He has been having a lot of diarrhea for over 48 hours.  He has not had any fevers or any abdominal pain.  He denies any urinary symptoms but also reports that he is hardly urinated at all.  He has had markedly decreased p.o. intake.  Wife reports that about 2 weeks ago he was told he had a possible UTI but was not treated as at that time he was asymptomatic.  Today patient was found to be hypotensive with systolics in the 20U and PCPs office so therefore sent to the emergency department.  Patient has been given a liter and a half of fluids and his blood pressures are improved in the 90s.  Patient found to have a UTI and stool cultures are pending.  Patient referred for admission for sepsis secondary to unclear source.    Clinical Impression  [name] is a 84 y.o. presenting for PT evaluation with recent decrease in functional mobility secondary to above admission and hospital course. He is currently functioning close to his baseline of independent with RW for household mobility, and currently is independent with bed mobility and requires supervision/min guard for transfers/gait with use of RW. He has 24/7 assistance available at home from his daughters and is able to live on the first floor of his home. He was previously receiving HHPT prior to recent hospital admission and will benefit from continued skilled HHPT to address current impairments progress mobility and reduce fall risk. Acute PT will follow during his stay.     Follow Up Recommendations Home health PT    Equipment Recommendations  None recommended by PT     Recommendations for Other Services       Precautions / Restrictions Precautions Precautions: Fall Restrictions Weight Bearing Restrictions: No      Mobility  Bed Mobility Overal bed mobility: Modified Independent         Transfers Overall transfer level: Needs assistance   Transfers: Sit to/from Stand Sit to Stand: Supervision     Ambulation/Gait Ambulation/Gait assistance: Supervision;Min guard Gait Distance (Feet): 125 Feet Assistive device: Rolling walker (2 wheeled) Gait Pattern/deviations: Step-through pattern;Decreased step length - right;Decreased step length - left;Decreased stride length;Narrow base of support     General Gait Details: gait slow, requires cues to look up and reduce flexed trunk      Balance Overall balance assessment: Needs assistance Sitting-balance support: No upper extremity supported;Feet supported Sitting balance-Leahy Scale: Good     Standing balance support: Bilateral upper extremity supported;During functional activity Standing balance-Leahy Scale: Fair Standing balance comment: requires UE support on RW during gait, able to stand without UE support for static balance        High level balance activites: Backward walking High Level Balance Comments: no overt LOB during backwards walking with RW          Pertinent Vitals/Pain Pain Assessment: No/denies pain    Home Living Family/patient expects to be discharged to:: Private residence Living Arrangements: Spouse/significant other Available Help at Discharge: Family;Available 24 hours/day Type of Home: House Home Access: Stairs to enter Entrance Stairs-Rails: Right Entrance Stairs-Number of Steps: 3  in front and 4 steps in back, 1 siderail on right side going up in front Home Layout: Able to live on main level with bedroom/bathroom;One level;Laundry or work area in [place]: Environmental consultant - 2 wheels;Bedside commode;Shower seat;Cane - single point Additional  Comments: has shower bench in basement, recommended family move this to upstairs shower for increased safety in bathroom    Prior Function Level of Independence: Independent with assistive device(s)     Comments: has been using RW for last few days secondary to sickness; patient recieving HHPT prior to hospital admission         Extremity/Trunk Assessment   Upper Extremity Assessment Upper Extremity Assessment: Overall WFL for tasks assessed    Lower Extremity Assessment Lower Extremity Assessment: Generalized weakness    Cervical / Trunk Assessment Cervical / Trunk Assessment: Kyphotic  Communication   Communication: HOH  Cognition Arousal/Alertness: Awake/alert Behavior During Therapy: WFL for tasks assessed/performed Overall Cognitive Status: Within Functional Limits for tasks assessed                  Assessment/Plan    PT Assessment Patient needs continued PT services  PT Problem List Decreased strength;Decreased activity tolerance;Decreased balance;Decreased mobility       PT Treatment Interventions DME instruction;Balance training;Gait training;Stair training;Functional mobility training;Therapeutic activities;Therapeutic exercise;Patient/family education    PT Goals (Current goals can be found in the Care Plan section)  Acute Rehab PT Goals Patient Stated Goal: improve balance and return home PT Goal Formulation: With patient/family Time For Goal Achievement: 12/03/17 Potential to Achieve Goals: Good    Frequency Min 3X/week    AM-PAC PT "6 Clicks" Daily Activity  Outcome Measure Difficulty turning over in bed (including adjusting bedclothes, sheets and blankets)?: None Difficulty moving from lying on back to sitting on the side of the bed? : None Difficulty sitting down on and standing up from a chair with arms (e.g., wheelchair, bedside commode, etc,.)?: A Little Help needed moving to and from a bed to chair (including a wheelchair)?: A Little Help  needed walking in hospital room?: A Little Help needed climbing 3-5 steps with a railing? : A Little 6 Click Score: 20    End of Session Equipment Utilized During Treatment: Gait belt Activity Tolerance: Patient tolerated treatment well Patient left: in bed;with call bell/phone within reach;with bed alarm set;with family/visitor present Nurse Communication: Mobility status PT Visit Diagnosis: Unsteadiness on feet (R26.81);Repeated falls (R29.6);Muscle weakness (generalized) (M62.81);Difficulty in walking, not elsewhere classified (R26.2);Other abnormalities of gait and mobility (R26.89)    Time: 8891-6945 PT Time Calculation (min) (ACUTE ONLY): 43 min   Charges:   PT Evaluation $PT Eval Low Complexity: 1 Low PT Treatments $Gait Training: 8-22 mins $Therapeutic Activity: 8-22 mins        Kipp Brood, PT, DPT Physical Therapist with [facility]  11/28/2017 12:24 PM

## 2017-11-28 NOTE — Discharge Instructions (Signed)
Urinary Tract Infection, Adult A urinary tract infection (UTI) is an infection of any part of the urinary tract. The urinary tract includes the:  Kidneys.  Ureters.  Bladder.  Urethra.  These organs make, store, and get rid of pee (urine) in the body. Follow these instructions at home:  Take over-the-counter and prescription medicines only as told by your doctor.  If you were prescribed an antibiotic medicine, take it as told by your doctor. Do not stop taking the antibiotic even if you start to feel better.  Avoid the following drinks: ? Alcohol. ? Caffeine. ? Tea. ? Carbonated drinks.  Drink enough fluid to keep your pee clear or pale yellow.  Keep all follow-up visits as told by your doctor. This is important.  Make sure to: ? Empty your bladder often and completely. Do not to hold pee for long periods of time. ? Empty your bladder before and after sex. ? Wipe from front to back after a bowel movement if you are male. Use each tissue one time when you wipe. Contact a doctor if:  You have back pain.  You have a fever.  You feel sick to your stomach (nauseous).  You throw up (vomit).  Your symptoms do not get better after 3 days.  Your symptoms go away and then come back. Get help right away if:  You have very bad back pain.  You have very bad lower belly (abdominal) pain.  You are throwing up and cannot keep down any medicines or water. This information is not intended to replace advice given to you by your health care provider. Make sure you discuss any questions you have with your health care provider. Document Released: 09/23/2007 Document Revised: 09/12/2015 Document Reviewed: 02/25/2015 Elsevier Interactive Patient Education  2018 [employer] Pneumonia, Adult Pneumonia is an infection of the lungs. One type of pneumonia can happen while a person is in a hospital. A different type can happen when a person is not in a hospital  (community-acquired pneumonia). It is easy for this kind to spread from person to person. It can spread to you if you breathe near an infected person who coughs or sneezes. Some symptoms include:  A dry cough.  A wet (productive) cough.  Fever.  Sweating.  Chest pain.  Follow these instructions at home:  Take over-the-counter and prescription medicines only as told by your doctor. ? Only take cough medicine if you are losing sleep. ? If you were prescribed an antibiotic medicine, take it as told by your doctor. Do not stop taking the antibiotic even if you start to feel better.  Sleep with your head and neck raised (elevated). You can do this by putting a few pillows under your head, or you can sleep in a recliner.  Do not use tobacco products. These include cigarettes, chewing tobacco, and e-cigarettes. If you need help quitting, ask your doctor.  Drink enough water to keep your pee (urine) clear or pale yellow. A shot (vaccine) can help prevent pneumonia. Shots are often suggested for:  People older than 82 years of age.  People older than 82 years of age: ? Who are having cancer treatment. ? Who have long-term (chronic) lung disease. ? Who have problems with their body's defense system (immune system).  You may also prevent pneumonia if you take these actions:  Get the flu (influenza) shot every year.  Go to the dentist as often as told.  Wash your hands often. If soap and water  are not available, use hand sanitizer.  Contact a doctor if:  You have a fever.  You lose sleep because your cough medicine does not help. Get help right away if:  You are short of breath and it gets worse.  You have more chest pain.  Your sickness gets worse. This is very serious if: ? You are an older adult. ? Your body's defense system is weak.  You cough up blood. This information is not intended to replace advice given to you by your health care provider. Make sure you discuss  any questions you have with your health care provider. Document Released: 09/23/2007 Document Revised: 09/12/2015 Document Reviewed: 08/01/2014 Elsevier Interactive Patient Education  [employer].

## 2017-11-28 NOTE — Plan of Care (Signed)
  Problem: Acute Rehab PT Goals(only PT should resolve) Goal: Patient Will Transfer Sit To/From Stand Outcome: Adequate for Discharge Flowsheets (Taken 11/28/2017 1228) Patient will transfer sit to/from stand: with modified independence Goal: Pt Will Transfer Bed To Chair/Chair To Bed Outcome: Adequate for Discharge Flowsheets (Taken 11/28/2017 1228) Pt will Transfer Bed to Chair/Chair to Bed: with modified independence Goal: Pt Will Ambulate Outcome: Adequate for Discharge Flowsheets (Taken 11/28/2017 1228) Pt will Ambulate: 100 feet; with rolling walker; with modified independence

## 2017-11-28 NOTE — Progress Notes (Signed)
Called on call CM, no answer, message left making aware that patient is discharging today and [facility] to continue

## 2017-11-28 NOTE — Progress Notes (Signed)
ANTICOAGULATION CONSULT NOTE - Initial Consult  Pharmacy Consult for  warfarin Indication: atrial fibrillation  Goal INR :2- 3 INR today: 2.62 Home dose: 5mg /day (35mg  per week) Last dose: pt took 5mg  8/8 Concurrent anti-platelet drugs:  N/a Major drug interactions:    Allergies  Allergen Reactions  . Augmentin [Amoxicillin-Pot Clavulanate] Rash    Redness of skin    Patient Measurements: Height: 5\' 10"  (177.8 cm) Weight: 254 lb 3.1 oz (115.3 kg) IBW/kg (Calculated) : 73 Heparin Dosing Weight:   Vital Signs: Temp: 97.7 F (36.5 C) (08/11 0704) Temp Source: Oral (08/11 0704) BP: 153/76 (08/11 0704) Pulse Rate: 96 (08/11 0704)  Labs: Recent Labs    11/25/17 1141  11/26/17 0511 11/27/17 0829 11/28/17 0606  HGB 13.6  --  13.1 12.3*  --   HCT 38.3*  --  37.5* 35.0*  --   PLT 134*  --  122* 121*  --   LABPROT  --    < > 36.5* 34.1* 27.8*  INR  --    < > 3.71 3.40 2.62  CREATININE 1.06  --  0.72 0.67  --    < > = values in this interval not displayed.    Estimated Creatinine Clearance: 76.5 mL/min (by C-G formula based on SCr of 0.67 mg/dL).   Medical History: Past Medical History:  Diagnosis Date  . Acute bronchitis   . Arthritis   . Atrial fibrillation ([place])    Dr. Harl Bowie- [facility] follows saw 11'14  . Bladder stones    tx. with oral meds and antibiotics, now surgery planned  . Cataracts, both eyes    surgery planned May 2015  . Diabetes mellitus without complication ([place])    Type II  . Dyspnea    with activity  . Dysrhythmia    Afib  . Family history of breast cancer   . Family history of colon cancer   . GERD (gastroesophageal reflux disease)   . 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 Margot Chimes, thus far without  recurrence.   . Hypertension   . NHL (non-Hodgkin's lymphoma) ([place]) 2005   Diffuse large B-cell lymphoma, clinically stage IIIA, CD20 positive, status post cervical lymph node biopsy 08/03/2003 on the left. PET scan was also positive in the spleen and small bowel region, but bone marrow aspiration and biopsy were negative. So he essentially had stage IIIAs. He received R-CHOP x6 cycles with CR established by PET scan criteria on 11/23/2003 with no evidence for relapse th  . PAD (peripheral artery disease) ([place]) 08/27/2017  . Skin cancer    Basal cell- face,head, neck,  arms, legs, Back    Medications:  Medications Prior to Admission  Medication Sig Dispense Refill Last Dose  . acetaminophen (TYLENOL) 500 MG tablet Take 500-1,000 mg by mouth daily as needed for mild pain, fever or headache.   Taking  . amitriptyline (ELAVIL) 25 MG tablet Take 25 mg by mouth at bedtime.   11/24/2017 at Unknown time  . clotrimazole-betamethasone (LOTRISONE) cream Apply 1 application topically daily as needed (for fungus).    Taking  . CRANBERRY PO Take 4,200 mg by mouth daily.   11/24/2017 at Unknown time  . cyanocobalamin (,VITAMIN B-12,) 1000 MCG/ML injection Inject 1,000 mcg every 30 (thirty) days into the skin.  Past Month at Unknown time  . denosumab (XGEVA) 120 MG/1.7ML SOLN injection Inject 120 mg into the skin every 30 (thirty) days.    Past Month at Unknown time  . diltiazem (CARDIZEM) 90 MG tablet TAKE 1 TABLET BY MOUTH TWICE DAILY 60 tablet 0 11/25/2017 at Unknown time  . diphenhydrAMINE (BENADRYL) 25 MG tablet Take 25 mg by mouth every 6 (six) hours as needed for itching.   Taking  . docusate sodium (COLACE) 100 MG capsule Take 100 mg by mouth daily as needed for mild constipation.   Taking  . furosemide (LASIX) 20 MG tablet Take 1 tablet (20 mg total) by mouth daily. 30 tablet 11 11/24/2017 at Unknown time  . glipiZIDE (GLUCOTROL XL) 5 MG 24 hr tablet Take 5 mg by mouth daily with breakfast.    11/24/2017 at  Unknown time  . Leuprolide Acetate, 6 Month, (LUPRON DEPOT) 45 MG injection Inject 45 mg into the muscle every 6 (six) months.   Taking  . lisinopril (PRINIVIL,ZESTRIL) 20 MG tablet Take 1 tablet (20 mg total) daily by mouth. 30 tablet 11 11/25/2017 at Unknown time  . loperamide (IMODIUM) 2 MG capsule Take 2 mg by mouth as needed for diarrhea or loose stools.   11/25/2017 at Unknown time  . metFORMIN (GLUCOPHAGE) 850 MG tablet Take 500 mg by mouth 2 (two) times daily with a meal.    11/24/2017 at Unknown time  . mirtazapine (REMERON) 15 MG tablet Take 15 mg by mouth at bedtime.   11/24/2017 at Unknown time  . nystatin (MYCOSTATIN) powder Apply 1 g topically 4 (four) times daily as needed (fungus).    Taking  . omeprazole (PRILOSEC) 20 MG capsule Take 40 mg by mouth daily.    11/24/2017 at Unknown time  . phenylephrine-shark liver oil-mineral oil-petrolatum (PREPARATION H) 0.25-3-14-71.9 % rectal ointment Place 1 application rectally 2 (two) times daily as needed (for chaffing).   Taking  . potassium chloride (K-DUR,KLOR-CON) 10 MEQ tablet Take 2 tablets (20 mEq total) by mouth daily. 60 tablet 0 11/24/2017 at Unknown time  . predniSONE (DELTASONE) 5 MG tablet Take 1 tablet (5 mg total) by mouth 2 (two) times daily with a meal. (Patient taking differently: Take 2.5 mg by mouth 2 (two) times daily with a meal. ) 30 tablet 2 11/24/2017 at Unknown time  . sennosides-docusate sodium (SENOKOT-S) 8.6-50 MG tablet Take 1 tablet by mouth daily as needed for constipation.    Taking  . VENTOLIN HFA 108 (90 Base) MCG/ACT inhaler Inhale 1-2 puffs into the lungs every 6 (six) hours as needed for wheezing or shortness of breath.    11/24/2017 at Unknown time  . warfarin (COUMADIN) 2.5 MG tablet Take 2 tablets daily except 1 tablet on Tuesdays and Fridays or as directed (Patient taking differently: Take 2.5-5 mg by mouth See admin instructions. Take 2 tablets daily except 1 tablet on  Fridays or as directed) 60 tablet 6 11/25/2017 at  700  . CALCIUM CARB-CHOLECALCIFEROL PO Take 1 tablet by mouth daily.    Taking    Assessment: 76 yom on chronic warfarin therapy for a. Fib. Pt has been hypotensive and weak and has experienced  ~2 days' worth of diarrhea with supratherapeutic INR, finally patient INR < 3.0 and warfarin can be re-initiated.   Goal of Therapy:  INR 2-3 Monitor platelets by anticoagulation protocol: Yes   Plan:  Warfarin 2.5mg  po x 1 dose tonight   INR and CBC daily     Donna Christen  Dashonda Bonneau, PharmD, MBA, BCGP Clinical Pharmacist  11/28/2017 10:59 AM

## 2017-11-28 NOTE — Care Management (Signed)
Pt d/c to home to resume [facility] services with [facility].  HH orders and d/c summary faxed to [facility].

## 2017-11-28 NOTE — Progress Notes (Signed)
Patient discharged home.  AVS reviewed with patient, wife, and daughter.  Verbalized understanding.  No questions at this time.  Instructed to follow up with PCP.  Zytiga returned to patient from pharmacy.  Patient in NAD

## 2017-11-28 NOTE — Discharge Summary (Signed)
Physician Discharge Summary  [name] OJJ:[id number] DOB: [birth year] DOA: 11/25/2017  PCP: Rosita Fire, MD  Admit date: 11/25/2017 Discharge date: 11/28/2017  Time spent: 45 minutes  Recommendations for Outpatient Follow-up:  -Will be discharged home today. -Advised to follow up with PCP in 2 weeks.   Discharge Diagnoses:  Principal Problem:   Diarrhea Active Problems:   Essential hypertension   ATRIAL FIBRILLATION   NHL (non-Hodgkin's lymphoma) ([place])   Acute lower UTI   Discharge Condition: Stable and improved  Filed Weights   11/25/17 1826 11/26/17 0531 11/27/17 0706  Weight: 70 kg 117.7 kg 115.3 kg    History of present illness:  As per Dr. Shanon Brow on 8/8: [name] is a 84 y.o. male with medical history significant of A. fib on Coumadin, hypertension, diabetes, went to see his primary care physician today for feeling weak was found to be hypotensive and sent to the ED.  He has been having a lot of diarrhea for over 48 hours.  He has not had any fevers or any abdominal pain.  He denies any urinary symptoms but also reports that he is hardly urinated at all.  He has had markedly decreased p.o. intake.  Wife reports that about 2 weeks ago he was told he had a possible UTI but was not treated as at that time he was asymptomatic.  Today patient was found to be hypotensive with systolics in the 93Z and PCPs office so therefore sent to the emergency department.  Patient has been given a liter and a half of fluids and his blood pressures are improved in the 90s.  Patient found to have a UTI and stool cultures are pending.  Patient referred for admission for sepsis secondary to unclear source.  Hospital Course:   Diarrhea -Has already tapered down, suspect viral gastroenteritis given multiple family members affected. -Symptomatic management.  UTI -Will transition to keflex for 3 more days on DC. -Cx appears to not have been drawn on admission.  Pneumonia, presumed  hospital-acquired versus aspiration -Transition to keflex to cover both UTI and PNA for 3 more days.  Sepsis due to pneumonia and UTI and also possibly viral gastroenteritis -Sepsis parameters have resolved with IV fluids and antibiotics.  Antibiotic management as above.  Hypertension -BP meds have been restarted. -Continue OP follow up.  Atrial fibrillation -Improved rate control overnight with initiation of home doses of cardizem. HR currently in the 100-110 range. Not anticoagulated, unclear of reason, but likely due to advanced age.  Hospital delirium -Patient is sundowning with increased confusion at nighttime, and not uncommon in elderly patient with infection. -Plan to discharge home as early as possible, try to avoid sedating medications. -Much improved over past 24 hours.   Procedures:  None   Consultations:  None  Discharge Instructions  Discharge Instructions    Diet - low sodium heart healthy   Complete by:  As directed    Increase activity slowly   Complete by:  As directed      Allergies as of 11/28/2017      Reactions   Augmentin [amoxicillin-pot Clavulanate] Rash   Redness of skin      Medication List    TAKE these medications   abiraterone acetate 250 MG tablet Commonly known as:  ZYTIGA Take 4 tablets (1,000 mg total) by mouth daily. Take on an empty stomach 1 hour before or 2 hours after a meal   acetaminophen 500 MG tablet Commonly known as:  TYLENOL Take 500-1,000 mg by mouth daily as needed for mild pain, fever or headache.   amitriptyline 25 MG tablet Commonly known as:  ELAVIL Take 25 mg by mouth at bedtime.   CALCIUM CARB-CHOLECALCIFEROL PO Take 1 tablet by mouth daily.   cephALEXin 500 MG capsule Commonly known as:  KEFLEX Take 1 capsule (500 mg total) by mouth 3 (three) times daily for 3 days.   clotrimazole-betamethasone cream Commonly known as:  LOTRISONE Apply 1 application topically daily as needed (for fungus).     CRANBERRY PO Take 4,200 mg by mouth daily.   cyanocobalamin 1000 MCG/ML injection Commonly known as:  (VITAMIN B-12) Inject 1,000 mcg every 30 (thirty) days into the skin.   denosumab 120 MG/1.7ML Soln injection Commonly known as:  XGEVA Inject 120 mg into the skin every 30 (thirty) days.   diltiazem 90 MG tablet Commonly known as:  CARDIZEM TAKE 1 TABLET BY MOUTH TWICE DAILY   diphenhydrAMINE 25 MG tablet Commonly known as:  BENADRYL Take 25 mg by mouth every 6 (six) hours as needed for itching.   docusate sodium 100 MG capsule Commonly known as:  COLACE Take 100 mg by mouth daily as needed for mild constipation.   furosemide 20 MG tablet Commonly known as:  LASIX Take 1 tablet (20 mg total) by mouth daily.   glipiZIDE 5 MG 24 hr tablet Commonly known as:  GLUCOTROL XL Take 5 mg by mouth daily with breakfast.   lisinopril 20 MG tablet Commonly known as:  PRINIVIL,ZESTRIL Take 1 tablet (20 mg total) daily by mouth.   loperamide 2 MG capsule Commonly known as:  IMODIUM Take 2 mg by mouth as needed for diarrhea or loose stools.   LUPRON DEPOT (71-MONTH) 45 MG injection Generic drug:  Leuprolide Acetate (6 Month) Inject 45 mg into the muscle every 6 (six) months.   metFORMIN 850 MG tablet Commonly known as:  GLUCOPHAGE Take 500 mg by mouth 2 (two) times daily with a meal.   mirtazapine 15 MG tablet Commonly known as:  REMERON Take 15 mg by mouth at bedtime.   nystatin powder Commonly known as:  MYCOSTATIN/NYSTOP Apply 1 g topically 4 (four) times daily as needed (fungus).   omeprazole 20 MG capsule Commonly known as:  PRILOSEC Take 40 mg by mouth daily.   phenylephrine-shark liver oil-mineral oil-petrolatum 0.25-3-14-71.9 % rectal ointment Commonly known as:  PREPARATION H Place 1 application rectally 2 (two) times daily as needed (for chaffing).   potassium chloride 10 MEQ tablet Commonly known as:  K-DUR,KLOR-CON Take 2 tablets (20 mEq total) by mouth  daily.   predniSONE 5 MG tablet Commonly known as:  DELTASONE Take 1 tablet (5 mg total) by mouth 2 (two) times daily with a meal. What changed:  how much to take   sennosides-docusate sodium 8.6-50 MG tablet Commonly known as:  SENOKOT-S Take 1 tablet by mouth daily as needed for constipation.   VENTOLIN HFA 108 (90 Base) MCG/ACT inhaler Generic drug:  albuterol Inhale 1-2 puffs into the lungs every 6 (six) hours as needed for wheezing or shortness of breath.   warfarin 2.5 MG tablet Commonly known as:  COUMADIN Take as directed. If you are unsure how to take this medication, talk to your nurse or doctor. Original instructions:  Take 2 tablets daily except 1 tablet on Tuesdays and Fridays or as directed What changed:    how much to take  how to take this  when to take this  additional  instructions      Allergies  Allergen Reactions  . Augmentin [Amoxicillin-Pot Clavulanate] Rash    Redness of skin   Follow-up Information    Rosita Fire, MD. Schedule an appointment as soon as possible for a visit in 2 week(s).   Specialty:  Internal Medicine Contact information: [address] [place] [address] [phone]            The results of significant diagnostics from this hospitalization (including imaging, microbiology, ancillary and laboratory) are listed below for reference.    Significant Diagnostic Studies: Dg Chest 1 View  Result Date: 11/25/2017 CLINICAL DATA:  Diarrhea x 2 days, weakness. History of skin cancer, DM, HTN, A-FIB, PAD, GERD, dysrhythmia. EXAM: CHEST  1 VIEW COMPARISON:  Chest x-ray dated 03/16/2017. FINDINGS: Bibasilar opacities, LEFT greater than RIGHT. Stable cardiomegaly. Aortic atherosclerosis. No acute or suspicious osseous finding. IMPRESSION: 1. New bibasilar opacities, LEFT greater than RIGHT. These could represent pneumonia, atelectasis or asymmetric edema. 2. Stable cardiomegaly. 3. Aortic atherosclerosis. Electronically  Signed   By: Franki Cabot M.D.   On: 11/25/2017 17:14   Ct Abdomen Pelvis W Contrast  Result Date: 11/25/2017 CLINICAL DATA:  Generalized abdominal pain, RIGHT lower quadrant abdominal pain, diarrhea, history gest, non-Hodgkin's lymphoma, atrial fibrillation, bladder calculi, type II diabetes mellitus, hypertension EXAM: CT ABDOMEN AND PELVIS WITH CONTRAST TECHNIQUE: Multidetector CT imaging of the abdomen and pelvis was performed using the standard protocol following bolus administration of intravenous contrast. Sagittal and coronal MPR images reconstructed from axial data set. CONTRAST:  11mL ISOVUE-300 IOPAMIDOL (ISOVUE-300) INJECTION 61% IV. No oral contrast. COMPARISON:  08/02/2017 FINDINGS: Lower chest: Bibasilar atelectasis Hepatobiliary: Gallbladder surgically absent.  Liver unremarkable Pancreas: Numerous pancreatic calcifications consistent with chronic calcific pancreatitis. Pancreatic atrophy. Cystic lesion at pancreatic tail 16 x 12 mm image 26 unchanged. Minimal cystic change at pancreatic head appears unchanged. Spleen: Normal appearance Adrenals/Urinary Tract: Adrenal glands normal appearance. Cortical scarring RIGHT kidney. Kidneys and ureters otherwise normal appearance. Nodular thickening from tiny diverticula at the anterior superior bladder wall unchanged since previous study as well as an earlier exam from 10/05/2014. Stomach/Bowel: Question anorectal wall thickening. Scattered fluid throughout colon consistent with history of diarrhea. Normal appendix. Stomach decompressed, with suboptimal assessment of gastric wall thickness. Small bowel loops unremarkable. Vascular/Lymphatic: Atherosclerotic calcifications aorta and iliac arteries. Aorta normal caliber. Coronary arterial calcification noted. No adenopathy. Reproductive: Unremarkable Other: Small LEFT inguinal hernia containing fat. No free air or free fluid. No definite inflammatory process. Musculoskeletal: Degenerative disc disease  changes lumbar spine. IMPRESSION: Tiny bladder diverticula. Chronic calcific pancreatitis. Stable cystic lesion at pancreatic tail 16 x 12 mm and minimal cystic change at pancreatic head stable. Question anorectal wall thickening; correlation with proctoscopy recommended to exclude inflammation and mass. Small LEFT inguinal hernia. Aortic Atherosclerosis (ICD10-I70.0). Electronically Signed   By: Lavonia Dana M.D.   On: 11/25/2017 14:44    Microbiology: Recent Results (from the past 240 hour(s))  Gastrointestinal Panel by PCR , Stool     Status: Abnormal   Collection Time: 11/26/17  2:00 AM  Result Value Ref Range Status   Campylobacter species NOT DETECTED NOT DETECTED Final   Plesimonas shigelloides NOT DETECTED NOT DETECTED Final   Salmonella species NOT DETECTED NOT DETECTED Final   Yersinia enterocolitica NOT DETECTED NOT DETECTED Final   Vibrio species NOT DETECTED NOT DETECTED Final   Vibrio cholerae NOT DETECTED NOT DETECTED Final   Enteroaggregative E coli (EAEC) NOT DETECTED NOT DETECTED Final   Enteropathogenic  E coli (EPEC) NOT DETECTED NOT DETECTED Final   Enterotoxigenic E coli (ETEC) NOT DETECTED NOT DETECTED Final   Shiga like toxin producing E coli (STEC) NOT DETECTED NOT DETECTED Final   Shigella/Enteroinvasive E coli (EIEC) NOT DETECTED NOT DETECTED Final   Cryptosporidium NOT DETECTED NOT DETECTED Final   Cyclospora cayetanensis NOT DETECTED NOT DETECTED Final   Entamoeba histolytica NOT DETECTED NOT DETECTED Final   Giardia lamblia NOT DETECTED NOT DETECTED Final   Adenovirus F40/41 NOT DETECTED NOT DETECTED Final   Astrovirus DETECTED (A) NOT DETECTED Final   Norovirus GI/GII NOT DETECTED NOT DETECTED Final   Rotavirus A NOT DETECTED NOT DETECTED Final   Sapovirus (I, II, IV, and V) NOT DETECTED NOT DETECTED Final    Comment: Performed at [facility], [address] [place], [place] [address]     Labs: Basic Metabolic Panel: Recent Labs  Lab  11/25/17 1141 11/26/17 0511 11/27/17 0829  NA 136 141 141  K 3.0* 3.4* 3.4*  CL 106 114* 116*  CO2 21* 19* 17*  GLUCOSE 167* 93 157*  BUN 24* 17 11  CREATININE 1.06 0.72 0.67  CALCIUM 8.1* 7.2* 7.0*   Liver Function Tests: Recent Labs  Lab 11/25/17 1141  AST 25  ALT 15  ALKPHOS 58  BILITOT 0.6  PROT 6.0*  ALBUMIN 3.2*   Recent Labs  Lab 11/25/17 1141  LIPASE 17   No results for input(s): AMMONIA in the last 168 hours. CBC: Recent Labs  Lab 11/25/17 1141 11/26/17 0511 11/27/17 0829  WBC 8.4 4.6 4.0  HGB 13.6 13.1 12.3*  HCT 38.3* 37.5* 35.0*  MCV 87.2 87.4 87.5  PLT 134* 122* 121*   Cardiac Enzymes: No results for input(s): CKTOTAL, CKMB, CKMBINDEX, TROPONINI in the last 168 hours. BNP: BNP (last 3 results) Recent Labs    03/16/17 1925  BNP 132.0*    ProBNP (last 3 results) No results for input(s): PROBNP in the last 8760 hours.  CBG: Recent Labs  Lab 11/27/17 1133 11/27/17 1632 11/27/17 2156 11/28/17 0737 11/28/17 1132  GLUCAP 114* 137* 170* 137* 183*       Signed:  [facility] Pager: [phone] 11/28/2017, 12:11 PM

## 2017-11-29 ENCOUNTER — Telehealth (HOSPITAL_COMMUNITY): Payer: Self-pay | Admitting: Hematology

## 2017-11-29 NOTE — Telephone Encounter (Signed)
[facility] could not fill zytiga script had to be filled by [facility]. Forwarded script to [facility]

## 2017-11-30 ENCOUNTER — Inpatient Hospital Stay (HOSPITAL_COMMUNITY): Payer: Medicare Other

## 2017-11-30 ENCOUNTER — Inpatient Hospital Stay (HOSPITAL_COMMUNITY): Payer: Medicare Other | Attending: Hematology

## 2017-11-30 DIAGNOSIS — D472 Monoclonal gammopathy: Secondary | ICD-10-CM | POA: Insufficient documentation

## 2017-11-30 DIAGNOSIS — C7951 Secondary malignant neoplasm of bone: Secondary | ICD-10-CM | POA: Diagnosis not present

## 2017-11-30 DIAGNOSIS — C61 Malignant neoplasm of prostate: Secondary | ICD-10-CM | POA: Diagnosis not present

## 2017-11-30 LAB — COMPREHENSIVE METABOLIC PANEL
ALT: 15 U/L (ref 0–44)
AST: 19 U/L (ref 15–41)
Albumin: 3.1 g/dL — ABNORMAL LOW (ref 3.5–5.0)
Alkaline Phosphatase: 64 U/L (ref 38–126)
Anion gap: 7 (ref 5–15)
BUN: 7 mg/dL — ABNORMAL LOW (ref 8–23)
CO2: 24 mmol/L (ref 22–32)
Calcium: 7.5 mg/dL — ABNORMAL LOW (ref 8.9–10.3)
Chloride: 107 mmol/L (ref 98–111)
Creatinine, Ser: 0.59 mg/dL — ABNORMAL LOW (ref 0.61–1.24)
GFR calc Af Amer: >60 mL/min (ref >60)
GFR calc non Af Amer: >60 mL/min (ref >60)
Glucose, Bld: 190 mg/dL — ABNORMAL HIGH (ref 70–99)
Potassium: 3.1 mmol/L — ABNORMAL LOW (ref 3.5–5.1)
Sodium: 138 mmol/L (ref 135–145)
Total Bilirubin: 1 mg/dL (ref 0.3–1.2)
Total Protein: 5.7 g/dL — ABNORMAL LOW (ref 6.5–8.1)

## 2017-11-30 NOTE — Progress Notes (Signed)
xgeva held today due to low calcium and albumin. Instructed patient and daughter on taking calcium and vit. D. Daughter stated he has been taking it but had been in the hospital for dehydration and diarrhea. Encouraged patient to take additional calcium to help get back to a normal range.   Discharged home from clinic ambulatory with his walker and his daughter at his side. Follow up as scheduled.

## 2017-12-01 ENCOUNTER — Ambulatory Visit (INDEPENDENT_AMBULATORY_CARE_PROVIDER_SITE_OTHER): Payer: Medicare Other | Admitting: *Deleted

## 2017-12-01 DIAGNOSIS — I482 Chronic atrial fibrillation: Secondary | ICD-10-CM | POA: Diagnosis not present

## 2017-12-01 DIAGNOSIS — Z5181 Encounter for therapeutic drug level monitoring: Secondary | ICD-10-CM | POA: Diagnosis not present

## 2017-12-01 DIAGNOSIS — S51802D Unspecified open wound of left forearm, subsequent encounter: Secondary | ICD-10-CM | POA: Diagnosis not present

## 2017-12-01 DIAGNOSIS — M86171 Other acute osteomyelitis, right ankle and foot: Secondary | ICD-10-CM | POA: Diagnosis not present

## 2017-12-01 DIAGNOSIS — I4891 Unspecified atrial fibrillation: Secondary | ICD-10-CM

## 2017-12-01 DIAGNOSIS — E1151 Type 2 diabetes mellitus with diabetic peripheral angiopathy without gangrene: Secondary | ICD-10-CM | POA: Diagnosis not present

## 2017-12-01 DIAGNOSIS — I70203 Unspecified atherosclerosis of native arteries of extremities, bilateral legs: Secondary | ICD-10-CM | POA: Diagnosis not present

## 2017-12-01 DIAGNOSIS — D519 Vitamin B12 deficiency anemia, unspecified: Secondary | ICD-10-CM | POA: Diagnosis not present

## 2017-12-01 LAB — POCT INR: INR: 1.7 — AB (ref 2.0–3.0)

## 2017-12-01 NOTE — Patient Instructions (Signed)
D/C from [facility] 8/11 UTI.  D/C INR 2.6  Finishes Keflex today Spoke with Dorian Pod with [facility].  Pt to take 3 tablets tonight then resume 2 tablets daily. Recheck on Monday 8/19. Order given to [facility]

## 2017-12-02 DIAGNOSIS — I482 Chronic atrial fibrillation: Secondary | ICD-10-CM | POA: Diagnosis not present

## 2017-12-02 DIAGNOSIS — E1165 Type 2 diabetes mellitus with hyperglycemia: Secondary | ICD-10-CM | POA: Diagnosis not present

## 2017-12-02 DIAGNOSIS — R197 Diarrhea, unspecified: Secondary | ICD-10-CM | POA: Diagnosis not present

## 2017-12-02 DIAGNOSIS — S51802D Unspecified open wound of left forearm, subsequent encounter: Secondary | ICD-10-CM | POA: Diagnosis not present

## 2017-12-02 DIAGNOSIS — I70203 Unspecified atherosclerosis of native arteries of extremities, bilateral legs: Secondary | ICD-10-CM | POA: Diagnosis not present

## 2017-12-02 DIAGNOSIS — D519 Vitamin B12 deficiency anemia, unspecified: Secondary | ICD-10-CM | POA: Diagnosis not present

## 2017-12-02 DIAGNOSIS — E1151 Type 2 diabetes mellitus with diabetic peripheral angiopathy without gangrene: Secondary | ICD-10-CM | POA: Diagnosis not present

## 2017-12-02 DIAGNOSIS — J188 Other pneumonia, unspecified organism: Secondary | ICD-10-CM | POA: Diagnosis not present

## 2017-12-02 DIAGNOSIS — I959 Hypotension, unspecified: Secondary | ICD-10-CM | POA: Diagnosis not present

## 2017-12-02 DIAGNOSIS — C8580 Other specified types of non-Hodgkin lymphoma, unspecified site: Secondary | ICD-10-CM | POA: Diagnosis not present

## 2017-12-02 DIAGNOSIS — M86171 Other acute osteomyelitis, right ankle and foot: Secondary | ICD-10-CM | POA: Diagnosis not present

## 2017-12-03 ENCOUNTER — Other Ambulatory Visit: Payer: Self-pay | Admitting: Cardiology

## 2017-12-06 DIAGNOSIS — E1151 Type 2 diabetes mellitus with diabetic peripheral angiopathy without gangrene: Secondary | ICD-10-CM | POA: Diagnosis not present

## 2017-12-07 ENCOUNTER — Ambulatory Visit (INDEPENDENT_AMBULATORY_CARE_PROVIDER_SITE_OTHER): Payer: Medicare Other | Admitting: Cardiology

## 2017-12-07 ENCOUNTER — Telehealth: Payer: Self-pay | Admitting: Cardiology

## 2017-12-07 DIAGNOSIS — M86171 Other acute osteomyelitis, right ankle and foot: Secondary | ICD-10-CM | POA: Diagnosis not present

## 2017-12-07 DIAGNOSIS — I4891 Unspecified atrial fibrillation: Secondary | ICD-10-CM | POA: Diagnosis not present

## 2017-12-07 DIAGNOSIS — I482 Chronic atrial fibrillation: Secondary | ICD-10-CM | POA: Diagnosis not present

## 2017-12-07 DIAGNOSIS — D519 Vitamin B12 deficiency anemia, unspecified: Secondary | ICD-10-CM | POA: Diagnosis not present

## 2017-12-07 DIAGNOSIS — S51802D Unspecified open wound of left forearm, subsequent encounter: Secondary | ICD-10-CM | POA: Diagnosis not present

## 2017-12-07 DIAGNOSIS — Z5181 Encounter for therapeutic drug level monitoring: Secondary | ICD-10-CM | POA: Diagnosis not present

## 2017-12-07 DIAGNOSIS — E1151 Type 2 diabetes mellitus with diabetic peripheral angiopathy without gangrene: Secondary | ICD-10-CM | POA: Diagnosis not present

## 2017-12-07 DIAGNOSIS — I70203 Unspecified atherosclerosis of native arteries of extremities, bilateral legs: Secondary | ICD-10-CM | POA: Diagnosis not present

## 2017-12-07 LAB — POCT INR: INR: 1.8 — AB (ref 2.0–3.0)

## 2017-12-07 NOTE — Telephone Encounter (Signed)
inr 1.8

## 2017-12-07 NOTE — Telephone Encounter (Signed)
See anticoag encounter from 12/07/17 for dosing details.

## 2017-12-08 ENCOUNTER — Other Ambulatory Visit: Payer: Self-pay | Admitting: Cardiology

## 2017-12-08 DIAGNOSIS — C8580 Other specified types of non-Hodgkin lymphoma, unspecified site: Secondary | ICD-10-CM | POA: Diagnosis not present

## 2017-12-08 DIAGNOSIS — J188 Other pneumonia, unspecified organism: Secondary | ICD-10-CM | POA: Diagnosis not present

## 2017-12-08 DIAGNOSIS — I959 Hypotension, unspecified: Secondary | ICD-10-CM | POA: Diagnosis not present

## 2017-12-08 DIAGNOSIS — R197 Diarrhea, unspecified: Secondary | ICD-10-CM | POA: Diagnosis not present

## 2017-12-09 DIAGNOSIS — M86171 Other acute osteomyelitis, right ankle and foot: Secondary | ICD-10-CM | POA: Diagnosis not present

## 2017-12-09 DIAGNOSIS — S51802D Unspecified open wound of left forearm, subsequent encounter: Secondary | ICD-10-CM | POA: Diagnosis not present

## 2017-12-09 DIAGNOSIS — E1151 Type 2 diabetes mellitus with diabetic peripheral angiopathy without gangrene: Secondary | ICD-10-CM | POA: Diagnosis not present

## 2017-12-09 DIAGNOSIS — I70203 Unspecified atherosclerosis of native arteries of extremities, bilateral legs: Secondary | ICD-10-CM | POA: Diagnosis not present

## 2017-12-09 DIAGNOSIS — D519 Vitamin B12 deficiency anemia, unspecified: Secondary | ICD-10-CM | POA: Diagnosis not present

## 2017-12-09 DIAGNOSIS — I482 Chronic atrial fibrillation: Secondary | ICD-10-CM | POA: Diagnosis not present

## 2017-12-13 DIAGNOSIS — R2681 Unsteadiness on feet: Secondary | ICD-10-CM | POA: Diagnosis not present

## 2017-12-13 DIAGNOSIS — E1165 Type 2 diabetes mellitus with hyperglycemia: Secondary | ICD-10-CM | POA: Diagnosis not present

## 2017-12-13 DIAGNOSIS — E43 Unspecified severe protein-calorie malnutrition: Secondary | ICD-10-CM | POA: Diagnosis not present

## 2017-12-14 ENCOUNTER — Telehealth: Payer: Self-pay | Admitting: *Deleted

## 2017-12-14 ENCOUNTER — Ambulatory Visit (INDEPENDENT_AMBULATORY_CARE_PROVIDER_SITE_OTHER): Payer: Medicare Other | Admitting: *Deleted

## 2017-12-14 ENCOUNTER — Other Ambulatory Visit: Payer: Self-pay | Admitting: Neurology

## 2017-12-14 DIAGNOSIS — D519 Vitamin B12 deficiency anemia, unspecified: Secondary | ICD-10-CM | POA: Diagnosis not present

## 2017-12-14 DIAGNOSIS — E1151 Type 2 diabetes mellitus with diabetic peripheral angiopathy without gangrene: Secondary | ICD-10-CM | POA: Diagnosis not present

## 2017-12-14 DIAGNOSIS — I4891 Unspecified atrial fibrillation: Secondary | ICD-10-CM | POA: Diagnosis not present

## 2017-12-14 DIAGNOSIS — M86171 Other acute osteomyelitis, right ankle and foot: Secondary | ICD-10-CM | POA: Diagnosis not present

## 2017-12-14 DIAGNOSIS — S51802D Unspecified open wound of left forearm, subsequent encounter: Secondary | ICD-10-CM | POA: Diagnosis not present

## 2017-12-14 DIAGNOSIS — Z5181 Encounter for therapeutic drug level monitoring: Secondary | ICD-10-CM | POA: Diagnosis not present

## 2017-12-14 DIAGNOSIS — I482 Chronic atrial fibrillation: Secondary | ICD-10-CM | POA: Diagnosis not present

## 2017-12-14 DIAGNOSIS — I70203 Unspecified atherosclerosis of native arteries of extremities, bilateral legs: Secondary | ICD-10-CM | POA: Diagnosis not present

## 2017-12-14 LAB — POCT INR: INR: 1.9 — AB (ref 2.0–3.0)

## 2017-12-14 NOTE — Patient Instructions (Signed)
Increase coumadin to 2 tablets daily except 3 tablets on Tuesdays. Recheck in 2 weeks. Order given to [facility] while in pt's home.

## 2017-12-14 NOTE — Telephone Encounter (Signed)
yolanda with [facility] called  INR 1.9  Please call [phone]

## 2017-12-14 NOTE — Telephone Encounter (Signed)
Done.  See coumadin note. 

## 2017-12-16 ENCOUNTER — Other Ambulatory Visit (HOSPITAL_COMMUNITY): Payer: Self-pay | Admitting: *Deleted

## 2017-12-16 DIAGNOSIS — L570 Actinic keratosis: Secondary | ICD-10-CM | POA: Diagnosis not present

## 2017-12-16 DIAGNOSIS — C61 Malignant neoplasm of prostate: Secondary | ICD-10-CM

## 2017-12-16 DIAGNOSIS — I482 Chronic atrial fibrillation: Secondary | ICD-10-CM | POA: Diagnosis not present

## 2017-12-16 DIAGNOSIS — M86171 Other acute osteomyelitis, right ankle and foot: Secondary | ICD-10-CM | POA: Diagnosis not present

## 2017-12-16 DIAGNOSIS — C44319 Basal cell carcinoma of skin of other parts of face: Secondary | ICD-10-CM | POA: Diagnosis not present

## 2017-12-16 DIAGNOSIS — D519 Vitamin B12 deficiency anemia, unspecified: Secondary | ICD-10-CM | POA: Diagnosis not present

## 2017-12-16 DIAGNOSIS — S51802D Unspecified open wound of left forearm, subsequent encounter: Secondary | ICD-10-CM | POA: Diagnosis not present

## 2017-12-16 DIAGNOSIS — D225 Melanocytic nevi of trunk: Secondary | ICD-10-CM | POA: Diagnosis not present

## 2017-12-16 DIAGNOSIS — B078 Other viral warts: Secondary | ICD-10-CM | POA: Diagnosis not present

## 2017-12-16 DIAGNOSIS — E1151 Type 2 diabetes mellitus with diabetic peripheral angiopathy without gangrene: Secondary | ICD-10-CM | POA: Diagnosis not present

## 2017-12-16 DIAGNOSIS — I70203 Unspecified atherosclerosis of native arteries of extremities, bilateral legs: Secondary | ICD-10-CM | POA: Diagnosis not present

## 2017-12-16 DIAGNOSIS — L820 Inflamed seborrheic keratosis: Secondary | ICD-10-CM | POA: Diagnosis not present

## 2017-12-16 DIAGNOSIS — X32XXXD Exposure to sunlight, subsequent encounter: Secondary | ICD-10-CM | POA: Diagnosis not present

## 2017-12-16 MED ORDER — PREDNISONE 5 MG PO TABS: 2.5000 mg | ORAL_TABLET | Freq: Two times a day (BID) | ORAL | 1 refills | Status: DC

## 2017-12-16 NOTE — Telephone Encounter (Signed)
Chart reviewed, per Dr. Tomie China last office note patient is to be taking 2.5 mg twice daily, prescription instructions changed to reflect this note. New RX sent to pharmacy.

## 2017-12-17 ENCOUNTER — Other Ambulatory Visit (HOSPITAL_COMMUNITY): Payer: Self-pay | Admitting: *Deleted

## 2017-12-21 ENCOUNTER — Inpatient Hospital Stay (HOSPITAL_COMMUNITY): Payer: Medicare Other | Attending: Hematology

## 2017-12-21 DIAGNOSIS — S51802D Unspecified open wound of left forearm, subsequent encounter: Secondary | ICD-10-CM | POA: Diagnosis not present

## 2017-12-21 DIAGNOSIS — Z7984 Long term (current) use of oral hypoglycemic drugs: Secondary | ICD-10-CM | POA: Diagnosis not present

## 2017-12-21 DIAGNOSIS — K219 Gastro-esophageal reflux disease without esophagitis: Secondary | ICD-10-CM | POA: Insufficient documentation

## 2017-12-21 DIAGNOSIS — D519 Vitamin B12 deficiency anemia, unspecified: Secondary | ICD-10-CM | POA: Diagnosis not present

## 2017-12-21 DIAGNOSIS — E1142 Type 2 diabetes mellitus with diabetic polyneuropathy: Secondary | ICD-10-CM | POA: Diagnosis not present

## 2017-12-21 DIAGNOSIS — M1990 Unspecified osteoarthritis, unspecified site: Secondary | ICD-10-CM | POA: Diagnosis not present

## 2017-12-21 DIAGNOSIS — Z7952 Long term (current) use of systemic steroids: Secondary | ICD-10-CM | POA: Diagnosis not present

## 2017-12-21 DIAGNOSIS — Z79899 Other long term (current) drug therapy: Secondary | ICD-10-CM | POA: Insufficient documentation

## 2017-12-21 DIAGNOSIS — R5383 Other fatigue: Secondary | ICD-10-CM | POA: Diagnosis not present

## 2017-12-21 DIAGNOSIS — C61 Malignant neoplasm of prostate: Secondary | ICD-10-CM | POA: Insufficient documentation

## 2017-12-21 DIAGNOSIS — I4891 Unspecified atrial fibrillation: Secondary | ICD-10-CM | POA: Insufficient documentation

## 2017-12-21 DIAGNOSIS — I739 Peripheral vascular disease, unspecified: Secondary | ICD-10-CM | POA: Insufficient documentation

## 2017-12-21 DIAGNOSIS — K8689 Other specified diseases of pancreas: Secondary | ICD-10-CM | POA: Insufficient documentation

## 2017-12-21 DIAGNOSIS — I10 Essential (primary) hypertension: Secondary | ICD-10-CM | POA: Diagnosis not present

## 2017-12-21 DIAGNOSIS — Z800 Family history of malignant neoplasm of digestive organs: Secondary | ICD-10-CM | POA: Insufficient documentation

## 2017-12-21 DIAGNOSIS — C7951 Secondary malignant neoplasm of bone: Secondary | ICD-10-CM | POA: Diagnosis not present

## 2017-12-21 DIAGNOSIS — Z79818 Long term (current) use of other agents affecting estrogen receptors and estrogen levels: Secondary | ICD-10-CM | POA: Diagnosis not present

## 2017-12-21 DIAGNOSIS — D472 Monoclonal gammopathy: Secondary | ICD-10-CM | POA: Diagnosis not present

## 2017-12-21 DIAGNOSIS — I70203 Unspecified atherosclerosis of native arteries of extremities, bilateral legs: Secondary | ICD-10-CM | POA: Diagnosis not present

## 2017-12-21 DIAGNOSIS — I482 Chronic atrial fibrillation: Secondary | ICD-10-CM | POA: Diagnosis not present

## 2017-12-21 DIAGNOSIS — E1151 Type 2 diabetes mellitus with diabetic peripheral angiopathy without gangrene: Secondary | ICD-10-CM | POA: Diagnosis not present

## 2017-12-21 DIAGNOSIS — M86171 Other acute osteomyelitis, right ankle and foot: Secondary | ICD-10-CM | POA: Diagnosis not present

## 2017-12-21 LAB — CBC WITH DIFFERENTIAL/PLATELET
Basophils Absolute: 0 10*3/uL (ref 0.0–0.1)
Basophils Relative: 0 %
Eosinophils Absolute: 0.1 10*3/uL (ref 0.0–0.7)
Eosinophils Relative: 1 %
HCT: 36.9 % — ABNORMAL LOW (ref 39.0–52.0)
Hemoglobin: 12.6 g/dL — ABNORMAL LOW (ref 13.0–17.0)
Lymphocytes Relative: 43 %
Lymphs Abs: 2.1 10*3/uL (ref 0.7–4.0)
MCH: 30.7 pg (ref 26.0–34.0)
MCHC: 34.1 g/dL (ref 30.0–36.0)
MCV: 89.8 fL (ref 78.0–100.0)
Monocytes Absolute: 0.8 10*3/uL (ref 0.1–1.0)
Monocytes Relative: 16 %
Neutro Abs: 1.9 10*3/uL (ref 1.7–7.7)
Neutrophils Relative %: 40 %
Platelets: 160 10*3/uL (ref 150–400)
RBC: 4.11 MIL/uL — ABNORMAL LOW (ref 4.22–5.81)
RDW: 13.5 % (ref 11.5–15.5)
WBC: 4.9 10*3/uL (ref 4.0–10.5)

## 2017-12-21 LAB — PSA: Prostatic Specific Antigen: <0.01 ng/mL (ref 0.00–4.00)

## 2017-12-21 LAB — COMPREHENSIVE METABOLIC PANEL
ALT: 13 U/L (ref 0–44)
AST: 17 U/L (ref 15–41)
Albumin: 3.7 g/dL (ref 3.5–5.0)
Alkaline Phosphatase: 66 U/L (ref 38–126)
Anion gap: 5 (ref 5–15)
BUN: 11 mg/dL (ref 8–23)
CO2: 29 mmol/L (ref 22–32)
Calcium: 9.5 mg/dL (ref 8.9–10.3)
Chloride: 104 mmol/L (ref 98–111)
Creatinine, Ser: 0.61 mg/dL (ref 0.61–1.24)
GFR calc Af Amer: >60 mL/min (ref >60)
GFR calc non Af Amer: >60 mL/min (ref >60)
Glucose, Bld: 162 mg/dL — ABNORMAL HIGH (ref 70–99)
Potassium: 4.1 mmol/L (ref 3.5–5.1)
Sodium: 138 mmol/L (ref 135–145)
Total Bilirubin: 0.9 mg/dL (ref 0.3–1.2)
Total Protein: 6.7 g/dL (ref 6.5–8.1)

## 2017-12-23 DIAGNOSIS — D649 Anemia, unspecified: Secondary | ICD-10-CM | POA: Diagnosis not present

## 2017-12-24 ENCOUNTER — Ambulatory Visit (INDEPENDENT_AMBULATORY_CARE_PROVIDER_SITE_OTHER): Payer: Medicare Other | Admitting: *Deleted

## 2017-12-24 DIAGNOSIS — Z5181 Encounter for therapeutic drug level monitoring: Secondary | ICD-10-CM

## 2017-12-24 DIAGNOSIS — I480 Paroxysmal atrial fibrillation: Secondary | ICD-10-CM | POA: Diagnosis not present

## 2017-12-24 DIAGNOSIS — I4891 Unspecified atrial fibrillation: Secondary | ICD-10-CM | POA: Diagnosis not present

## 2017-12-24 LAB — POCT INR: INR: 1 — AB (ref 2.0–3.0)

## 2017-12-24 NOTE — Patient Instructions (Signed)
Take coumadin 3 tablets daily until INR check on Wednesday 9/11 Started Ensure 2 cans daily about a week ago.

## 2017-12-27 ENCOUNTER — Other Ambulatory Visit: Payer: Self-pay

## 2017-12-27 ENCOUNTER — Telehealth (HOSPITAL_COMMUNITY): Payer: Self-pay

## 2017-12-27 ENCOUNTER — Other Ambulatory Visit (HOSPITAL_COMMUNITY): Payer: Medicare Other

## 2017-12-27 NOTE — Telephone Encounter (Signed)
Opened in error

## 2017-12-27 NOTE — Telephone Encounter (Signed)
Called the daughter concerning her father.  Reviewed medications with zytiga and prednisone and blood sugar levels.  She will accompany her dad with his doctors visit tomorrow.

## 2017-12-27 NOTE — Telephone Encounter (Signed)
-----   Message from Louis Meckel sent at 12/27/2017  9:25 AM EDT ----- Regarding: med question Patient has question about predisone.  Patient's engery level is low. Call daughter at [phone]

## 2017-12-28 ENCOUNTER — Ambulatory Visit (HOSPITAL_COMMUNITY): Payer: Medicare Other | Admitting: Internal Medicine

## 2017-12-28 ENCOUNTER — Ambulatory Visit (HOSPITAL_COMMUNITY): Payer: Medicare Other

## 2017-12-28 ENCOUNTER — Inpatient Hospital Stay (HOSPITAL_COMMUNITY): Payer: Medicare Other

## 2017-12-28 ENCOUNTER — Other Ambulatory Visit (HOSPITAL_COMMUNITY): Payer: Medicare Other

## 2017-12-28 ENCOUNTER — Other Ambulatory Visit: Payer: Self-pay

## 2017-12-28 ENCOUNTER — Inpatient Hospital Stay (HOSPITAL_BASED_OUTPATIENT_CLINIC_OR_DEPARTMENT_OTHER): Payer: Medicare Other | Admitting: Internal Medicine

## 2017-12-28 ENCOUNTER — Encounter (HOSPITAL_COMMUNITY): Payer: Self-pay | Admitting: Internal Medicine

## 2017-12-28 VITALS — BP 152/78 | HR 80 | Resp 18 | Wt 155.0 lb

## 2017-12-28 DIAGNOSIS — Z7984 Long term (current) use of oral hypoglycemic drugs: Secondary | ICD-10-CM

## 2017-12-28 DIAGNOSIS — Z800 Family history of malignant neoplasm of digestive organs: Secondary | ICD-10-CM

## 2017-12-28 DIAGNOSIS — I10 Essential (primary) hypertension: Secondary | ICD-10-CM | POA: Diagnosis not present

## 2017-12-28 DIAGNOSIS — C7951 Secondary malignant neoplasm of bone: Secondary | ICD-10-CM

## 2017-12-28 DIAGNOSIS — D472 Monoclonal gammopathy: Secondary | ICD-10-CM

## 2017-12-28 DIAGNOSIS — M1990 Unspecified osteoarthritis, unspecified site: Secondary | ICD-10-CM | POA: Diagnosis not present

## 2017-12-28 DIAGNOSIS — C61 Malignant neoplasm of prostate: Secondary | ICD-10-CM | POA: Diagnosis not present

## 2017-12-28 DIAGNOSIS — I4891 Unspecified atrial fibrillation: Secondary | ICD-10-CM

## 2017-12-28 DIAGNOSIS — Z79818 Long term (current) use of other agents affecting estrogen receptors and estrogen levels: Secondary | ICD-10-CM

## 2017-12-28 DIAGNOSIS — K8689 Other specified diseases of pancreas: Secondary | ICD-10-CM

## 2017-12-28 DIAGNOSIS — E1142 Type 2 diabetes mellitus with diabetic polyneuropathy: Secondary | ICD-10-CM

## 2017-12-28 DIAGNOSIS — I739 Peripheral vascular disease, unspecified: Secondary | ICD-10-CM

## 2017-12-28 DIAGNOSIS — Z79899 Other long term (current) drug therapy: Secondary | ICD-10-CM

## 2017-12-28 DIAGNOSIS — K219 Gastro-esophageal reflux disease without esophagitis: Secondary | ICD-10-CM | POA: Diagnosis not present

## 2017-12-28 DIAGNOSIS — R5383 Other fatigue: Secondary | ICD-10-CM | POA: Diagnosis not present

## 2017-12-28 DIAGNOSIS — Z7952 Long term (current) use of systemic steroids: Secondary | ICD-10-CM

## 2017-12-28 MED ORDER — DENOSUMAB 120 MG/1.7ML ~~LOC~~ SOLN
120.0000 mg | Freq: Once | SUBCUTANEOUS | Status: AC
  Administered 2017-12-28: 120 mg via SUBCUTANEOUS
  Filled 2017-12-28: qty 1.7

## 2017-12-28 NOTE — Patient Instructions (Signed)
[place] at [facility]  Discharge Instructions:  Today you received an Xgeva injection. Follow up as scheduled. Call clinic for any questions or concerns. _______________________________________________________________  Thank you for choosing [place] at [facility] to provide your oncology and hematology care.  To afford each patient quality time with our providers, please arrive at least 15 minutes before your scheduled appointment.  You need to re-schedule your appointment if you arrive 10 or more minutes late.  We strive to give you quality time with our providers, and arriving late affects you and other patients whose appointments are after yours.  Also, if you no show three or more times for appointments you may be dismissed from the clinic.  Again, thank you for choosing [place] [facility] hope is that these requests will allow you access to exceptional care and in a timely manner. _______________________________________________________________  If you have questions after your visit, please contact our office at [phone] between the hours of 8:30 a.m. and 5:00 p.m. Voicemails left after 4:30 p.m. will not be returned until the following business day. _______________________________________________________________  For prescription refill requests, have your pharmacy contact our office. _______________________________________________________________  Recommendations made by the consultant and any test results will be sent to your referring physician. _______________________________________________________________

## 2017-12-28 NOTE — Progress Notes (Signed)
Diagnosis No diagnosis found.  Staging Cancer Staging NHL (non-Hodgkin's lymphoma) ([place]) Staging form: Lymphoid Neoplasms, AJCC 6th Edition - Clinical: Stage III - Signed by Baird Cancer, PA on 08/18/2011   Assessment and Plan:  Prostate cancer ([place]) 1.  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 -Started on Abiraterone and prednisone in October 2016, tolerating very well.  He is on every 36-monthLupron injections.  Last Lupron injection was on 09/06/2017.   Labs done 12/21/2017 reviewed and showed WBC 4.9 HB 12.6 plts 160,000.  Chemistries WNL with K+ 4.1, Cr 0.61 and normal LFTS.  PSA < 0.01.  I discussed with pt and family due to problems with DM, Dr. KDelton Coombescut prednisone dose to 2.5 mg bid. BS have improved with adjustment so at present time increasing Prednisone dose is not recommended.  They will follow-up with Dr. KDelton Coombesin 4 weeks to discuss.      2.  Pancreatic lesions.  Pt had prior CT scan that showed some pancreatic calcifications. Pt had recent imaging during hospitalization in 11/2017.  Dr. KDelton Coombeswill review imaging for comparison.    3.  Bone metastasis.  Xgeva monthly.    4.  Fatigue.  HB 12.6.  I discussed with them increase in prednisone not recommended presently due to problems with blood sugar control.    5.  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    6.  DL BCL: He was originally diagnosed in 2005.  He does not have any symptoms or lymphadenopathy on the scan.  7.  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  No evidence of recurrence.  Current Status:  [facility]  He is wondering if Prednisone dose should be increased due to fatigue.      GIST (gastrointestinal stromal tumor), malignant ([place]   08/18/2011 Initial Diagnosis    GIST (gastrointestinal stromal tumor), malignant ([place]    02/08/2017 Genetic Testing    ATM Gain (Exons  62-63) VUS identified on the multi-gene panel.  The Multi-Gene Panel offered by [facility] includes sequencing and/or deletion duplication testing of the following 80 genes: ALK, APC, ATM, AXIN2,BAP1,  BARD1, BLM, BMPR1A, BRCA1, BRCA2, BRIP1, CASR, CDC73, CDH1, CDK4, CDKN1B, CDKN1C, CDKN2A (p14ARF), CDKN2A (p16INK4a), CEBPA, CHEK2, CTNNA1, DICER1, DIS3L2, EGFR (c.2369C>T, p.Thr790Met variant only), EPCAM (Deletion/duplication testing only), FH, FLCN, GATA2, GPC3, GREM1 (Promoter region deletion/duplication testing only), HOXB13 (c.251G>A, p.Gly84Glu), HRAS, KIT, MAX, MEN1, MET, MITF (c.952G>A, p.Glu318Lys variant only), MLH1, MSH2, MSH3, MSH6, MUTYH, NBN, NF1, NF2, NTHL1, PALB2, PDGFRA, PHOX2B, PMS2, POLD1, POLE, POT1, PRKAR1A, PTCH1, PTEN, RAD50, RAD51C, RAD51D, RB1, RECQL4, RET, RUNX1, SDHAF2, SDHA (sequence changes only), SDHB, SDHC, SDHD, SMAD4, SMARCA4, SMARCB1, SMARCE1, STK11, SUFU, TERT, TERT, TMEM127, TP53, TSC1, TSC2, VHL, WRN and WT1.  The report date is February 08, 2017.      Prostate cancer ([place]   09/08/2013 Initial Diagnosis    Prostate cancer ([place]    02/08/2017 Genetic Testing    ATM Gain (Exons 62-63) VUS identified on the multi-gene panel.  The Multi-Gene Panel offered by [facility] includes sequencing and/or deletion duplication testing of the following 80 genes: ALK, APC, ATM, AXIN2,BAP1,  BARD1, BLM, BMPR1A, BRCA1, BRCA2, BRIP1, CASR, CDC73, CDH1, CDK4, CDKN1B, CDKN1C, CDKN2A (p14ARF), CDKN2A (p16INK4a), CEBPA, CHEK2, CTNNA1, DICER1, DIS3L2, EGFR (c.2369C>T, p.Thr790Met variant only), EPCAM (Deletion/duplication testing only), FH, FLCN, GATA2, GPC3, GREM1 (Promoter region deletion/duplication testing only), HOXB13 (  c.251G>A, p.Gly84Glu), HRAS, KIT, MAX, MEN1, MET, MITF (c.952G>A, p.Glu318Lys variant only), MLH1, MSH2, MSH3, MSH6, MUTYH, NBN, NF1, NF2, NTHL1, PALB2, PDGFRA, PHOX2B, PMS2, POLD1, POLE, POT1, PRKAR1A, PTCH1, PTEN, RAD50, RAD51C, RAD51D, RB1, RECQL4, RET, RUNX1, SDHAF2, SDHA  (sequence changes only), SDHB, SDHC, SDHD, SMAD4, SMARCA4, SMARCB1, SMARCE1, STK11, SUFU, TERT, TERT, TMEM127, TP53, TSC1, TSC2, VHL, WRN and WT1.  The report date is February 08, 2017.       Problem List Patient Active Problem List   Diagnosis Date Noted  . Diarrhea [R19.7] 11/25/2017  . Acute lower UTI [N39.0] 11/25/2017  . PAD (peripheral artery disease) ([place]) [I73.9] 08/27/2017  . Great toe amputation status, left ([place]) [B35.329J] 07/30/2017  . Osteomyelitis of toe of left foot ([place]) [M86.9] 06/29/2017  . Acute bronchitis with bronchospasm [J20.9] 03/16/2017  . Type 2 diabetes mellitus ([place]) [E11.9] 03/16/2017  . Hypomagnesemia [E83.42] 03/16/2017  . 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 Postural dizziness with presyncope [R42, R55] 02/19/2017  . Polyneuropathy [G62.9] 02/19/2017  . Numbness [R20.0] 02/19/2017  . Urinary frequency [R35.0] 02/19/2017  . Genetic testing [Z13.79] 02/09/2017  . Family history of breast cancer [Z80.3]   . Family history of colon cancer [Z80.0]   . Other dysphagia [R13.19] 01/21/2016  . Bone metastases ([place]) [C79.51] 08/21/2015  . Prostate cancer ([place]) [C61] 09/08/2013  . Hypertrophy of prostate with urinary obstruction and other lower urinary tract symptoms (LUTS) [N40.1] 08/10/2013  . Encounter for therapeutic drug monitoring [Z51.81] 06/12/2013  . NHL (non-Hodgkin's lymphoma) ([place]) [C85.90] 08/18/2011  . GIST (gastrointestinal stromal tumor), malignant ([place]) [C49.A0] 08/18/2011  . Long term (current) use of anticoagulants [Z79.01] 07/18/2010  . Essential hypertension [I10] 10/04/2008  . ATRIAL FIBRILLATION [I48.91] 10/04/2008    Past Medical History Past Medical History:  Diagnosis Date  . Acute bronchitis   . Arthritis   . Atrial fibrillation ([place])    Dr. Harl Bowie- [facility] follows saw 11'14  . Bladder stones    tx. with oral meds and antibiotics, now surgery planned  . Cataracts, both eyes    surgery planned  May 2015  . Diabetes mellitus without complication ([place])    Type II  . Dyspnea    with activity  . Dysrhythmia    Afib  . Family history of breast cancer   . Family history of colon cancer   . GERD (gastroesophageal reflux disease)   . 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 Margot Chimes, thus far without recurrence.   . Hypertension   . NHL (non-Hodgkin's lymphoma) ([place]) 2005   Diffuse large B-cell lymphoma, clinically stage IIIA, CD20 positive, status post cervical lymph node biopsy 08/03/2003 on the left. PET scan was also positive in the spleen and small bowel region, but bone marrow aspiration and biopsy were negative. So he essentially had stage IIIAs. He received R-CHOP x6 cycles with CR established by PET scan criteria on 11/23/2003 with no evidence for relapse th  . PAD (peripheral artery disease) ([place]) 08/27/2017  . Skin cancer    Basal cell- face,head, neck,  arms, legs, Back    Past Surgical History Past Surgical History:  Procedure Laterality Date  . AMPUTATION Left 06/29/2017   Procedure: AMPUTATION LEFT GREAT TOE;  Surgeon: [name], MD;  Location: [facility];  Service: Vascular;  Laterality: Left;  . AMPUTATION Left 08/27/2017   Procedure: AMPUTATION Left SECOND TOE;  Surgeon: [name], MD;  Location: [facility];  Service: Vascular;  Laterality: Left;  . CATARACT EXTRACTION Bilateral 2015  . CHOLECYSTECTOMY    . COLON RESECTION     small bowel  . CYSTOSCOPY WITH LITHOLAPAXY N/A 08/10/2013   Procedure: CYSTOSCOPY WITH LITHOLAPAXY WITH [name];  Surgeon: [name], MD;  [facility];  Service: Urology;  Laterality: N/A;  . ESOPHAGEAL DILATION N/A 02/27/2016   Procedure: ESOPHAGEAL DILATION;  Surgeon: [name],  MD;  [facility];  Service: Endoscopy;  Laterality: N/A;  . ESOPHAGOGASTRODUODENOSCOPY N/A 02/27/2016   Procedure: ESOPHAGOGASTRODUODENOSCOPY (EGD);  Surgeon: [name], MD;  [facility];  Service: Endoscopy;  Laterality: N/A;  12:00  . INCISION AND DRAINAGE PERIRECTAL ABSCESS    . LOWER EXTREMITY ANGIOGRAPHY N/A 06/25/2017   Procedure: LOWER EXTREMITY ANGIOGRAPHY;  Surgeon: [name], MD;  Location: [facility];  Service: Cardiovascular;  Laterality: N/A;  . LYMPH NODE DISSECTION Left    '05-neck  . PORT-A-CATH REMOVAL    . PORTACATH PLACEMENT     insertion and removal -last chemotherapy 10 yrs ago  . TRANSURETHRAL RESECTION OF PROSTATE N/A 08/10/2013   Procedure: TRANSURETHRAL RESECTION OF THE PROSTATE WITH GYRUS INSTRUMENTS;  Surgeon: [name], MD;  [facility];  Service: Urology;  Laterality: N/A;    Family History Family History  Problem Relation Age of Onset  . Heart attack Mother        died in her 93s  . Other Father 67       pneumonia - died  . Breast cancer Sister        dx in her 56s-60s  . Colon cancer Brother        dx in his 20s  . Cancer Sister        TAH/BSO due to "male cancer"  . Breast cancer Daughter 43  . Breast cancer Daughter 44       DCIS - ATM VUS on [facility] 83 gene panel in 2018  . Breast cancer Daughter 24       DCIS - Negative on [employer] My[employer] 25 gene apnel in 2015  . Thyroid cancer Daughter 27       Medullary and Papillary  . Thyroid nodules Grandchild      Social History  reports that he has never smoked. He has never used smokeless tobacco. He reports that he does not drink alcohol or use drugs.  Medications  Current Outpatient Medications:  .  abiraterone acetate (ZYTIGA) 250 MG tablet, Take 4 tablets (1,000 mg total) by mouth daily. Take on an empty stomach 1 hour before or 2 hours after a meal, Disp: 120 tablet, Rfl: 6 .  acetaminophen (TYLENOL) 500 MG tablet, Take 500-1,000 mg by  mouth daily as needed for mild pain, fever or headache., Disp: , Rfl:  .  amitriptyline (ELAVIL) 25 MG tablet, Take 25 mg by mouth at bedtime., Disp: , Rfl:  .  CALCIUM CARB-CHOLECALCIFEROL PO, Take 1 tablet by mouth daily. , Disp: , Rfl:  .  clotrimazole-betamethasone (LOTRISONE) cream, Apply 1 application topically daily as needed (for fungus). , Disp: , Rfl:  .  CRANBERRY PO, Take 4,200 mg by mouth daily., Disp: , Rfl:  .  cyanocobalamin (,VITAMIN B-12,) 1000 MCG/ML injection, Inject 1,000 mcg every 30 (thirty) days into the skin., Disp: , Rfl:  .  denosumab (XGEVA) 120 MG/1.7ML SOLN injection, Inject 120 mg into the skin every 30 (thirty) days. , Disp: , Rfl:  .  diltiazem (CARDIZEM) 90 MG tablet, TAKE 1 TABLET BY MOUTH TWICE DAILY -- **NEEDS APPOINTMENT FOR FURTHER REFILLS**, Disp: 60 tablet, Rfl: 0 .  diltiazem (CARDIZEM) 90 MG tablet, TAKE 1 TABLET BY MOUTH TWICE DAILY -- **NEEDS APPOINTMENT FOR FURTHER REFILLS**, Disp: 60 tablet, Rfl: 0 .  diphenhydrAMINE (BENADRYL) 25 MG tablet, Take 25 mg by mouth every 6 (six) hours as needed for itching., Disp: , Rfl:  .  docusate sodium (COLACE) 100 MG capsule, Take 100 mg by mouth daily as needed for mild constipation., Disp: , Rfl:  .  furosemide (LASIX) 20 MG tablet, Take 1 tablet (20 mg total) by mouth daily., Disp: 30 tablet, Rfl: 11 .  glipiZIDE (GLUCOTROL XL) 5 MG 24 hr tablet, Take 5 mg by mouth daily with breakfast. , Disp: , Rfl:  .  Leuprolide Acetate, 6 Month, (LUPRON DEPOT) 45 MG injection, Inject 45 mg into the muscle every 6 (six) months., Disp: , Rfl:  .  lisinopril (PRINIVIL,ZESTRIL) 20 MG tablet, Take 1 tablet (20 mg total) daily by mouth., Disp: 30 tablet, Rfl: 11 .  loperamide (IMODIUM) 2 MG capsule, Take 2 mg by mouth as needed for diarrhea or loose stools., Disp: , Rfl:  .  metFORMIN (GLUCOPHAGE) 850 MG tablet, Take 500 mg by mouth 2 (two) times daily with a meal. , Disp: , Rfl:  .  mirtazapine (REMERON) 15 MG tablet, Take 15 mg by  mouth at bedtime., Disp: , Rfl:  .  nystatin (MYCOSTATIN) powder, Apply 1 g topically 4 (four) times daily as needed (fungus). , Disp: , Rfl:  .  omeprazole (PRILOSEC) 40 MG capsule, Take 40 mg by mouth daily., Disp: , Rfl: 3 .  phenylephrine-shark liver oil-mineral oil-petrolatum (PREPARATION H) 0.25-3-14-71.9 % rectal ointment, Place 1 application rectally 2 (two) times daily as needed (for chaffing)., Disp: , Rfl:  .  potassium chloride (K-DUR,KLOR-CON) 10 MEQ tablet, Take 2 tablets (20 mEq total) by mouth daily., Disp: 60 tablet, Rfl: 0 .  predniSONE (DELTASONE) 5 MG tablet, Take 0.5 tablets (2.5 mg total) by mouth 2 (two) times daily with a meal., Disp: 90 tablet, Rfl: 1 .  sennosides-docusate sodium (SENOKOT-S) 8.6-50 MG tablet, Take 1 tablet by mouth daily as needed for constipation. , Disp: , Rfl:  .  VENTOLIN HFA 108 (90 Base) MCG/ACT inhaler, Inhale 1-2 puffs into the lungs every 6 (six) hours as needed for wheezing or shortness of breath. , Disp: , Rfl:  .  warfarin (COUMADIN) 2.5 MG tablet, Take 2 tablets daily except 1 tablet on Tuesdays and Fridays or as directed (Patient taking differently: Take 2.5-5 mg by mouth See admin instructions. Take 2 tablets daily except 1 tablet on  Fridays or as directed), Disp: 60 tablet, Rfl: 6  Allergies Augmentin [amoxicillin-pot clavulanate]  Review of Systems Review of Systems - Oncology ROS negative other than fatigue   Physical Exam  Vitals Wt Readings from Last 3 Encounters:  12/28/17 155 lb (70.3 kg)  11/27/17 254 lb 3.1 oz (115.3 kg)  10/05/17 161 lb (73 kg)   Temp Readings from Last 3 Encounters:  11/28/17 97.7 F (36.5 C) (Oral)  11/02/17 (!) 97.5 F (36.4 C)  10/05/17 98.4 F (36.9 C) (Oral)   BP Readings from Last 3 Encounters:  12/28/17 (!) 152/78  11/28/17 (!) 153/76  11/02/17 140/81   Pulse Readings from Last 3  Encounters:  12/28/17 80  11/28/17 96  11/02/17 90    Constitutional: Well-developed, well-nourished,  and in no distress.   HENT: Head: Normocephalic and atraumatic.  Mouth/Throat: No oropharyngeal exudate. Mucosa moist. Eyes: Pupils are equal, round, and reactive to light. Conjunctivae are normal. No scleral icterus.  Neck: Normal range of motion. Neck supple. No JVD present.  Cardiovascular: Normal rate, regular rhythm and normal heart sounds.  Exam reveals no gallop and no friction rub.   No murmur heard. Pulmonary/Chest: Effort normal and breath sounds normal. No respiratory distress. No wheezes.No rales.  Abdominal: Soft. Bowel sounds are normal. No distension. There is no tenderness. There is no guarding.  Musculoskeletal: No edema or tenderness.  Lymphadenopathy: No cervical, axillary or supraclavicular adenopathy.  Neurological: Alert and oriented to person, place, and time. No cranial nerve deficit.  Skin: Skin is warm and dry. No rash noted. No erythema. No pallor.  Psychiatric: Affect and judgment normal.   Labs No visits with results within 3 Day(s) from this visit.  Latest known visit with results is:  Anti-coag visit on 12/24/2017  Component Date Value Ref Range Status  . INR 12/24/2017 1.0* 2.0 - 3.0 Final     Pathology No orders of the defined types were placed in this encounter.      Zoila Shutter MD

## 2017-12-28 NOTE — Progress Notes (Signed)
[name] tolerated Xgeva injection without incident or complaint. No bruising or swelling noted at injection site. Upon releasing [name], Epic states the pre-authorization has expired. I spoke with Angie who verbalized that patient was ok to receive injection today. VSS. Discharged using walker in satisfactory condition in presence of daughter.

## 2017-12-29 ENCOUNTER — Telehealth: Payer: Self-pay | Admitting: *Deleted

## 2017-12-29 ENCOUNTER — Ambulatory Visit (INDEPENDENT_AMBULATORY_CARE_PROVIDER_SITE_OTHER): Payer: Medicare Other | Admitting: *Deleted

## 2017-12-29 ENCOUNTER — Telehealth (HOSPITAL_COMMUNITY): Payer: Self-pay

## 2017-12-29 DIAGNOSIS — I480 Paroxysmal atrial fibrillation: Secondary | ICD-10-CM | POA: Diagnosis not present

## 2017-12-29 DIAGNOSIS — I4891 Unspecified atrial fibrillation: Secondary | ICD-10-CM

## 2017-12-29 DIAGNOSIS — Z5181 Encounter for therapeutic drug level monitoring: Secondary | ICD-10-CM

## 2017-12-29 LAB — POCT INR: INR: 1.6 — AB (ref 2.0–3.0)

## 2017-12-29 MED ORDER — APIXABAN 5 MG PO TABS: 5.0000 mg | ORAL_TABLET | Freq: Two times a day (BID) | ORAL | 3 refills | Status: DC

## 2017-12-29 MED ORDER — WARFARIN SODIUM 2.5 MG PO TABS: ORAL_TABLET | ORAL | 3 refills | Status: DC

## 2017-12-29 NOTE — Telephone Encounter (Signed)
Needing a new Rx for his coumadin sent to [facility], he's out and it's not time for him to refill

## 2017-12-29 NOTE — Telephone Encounter (Signed)
-----   Message from Louis Meckel sent at 12/29/2017  8:27 AM EDT ----- Regarding: question about med Patients daughter called she has questions about his  calcium level and the amount of calicum he neeeds to take.  Please call pts daughter at [phone] [name] )

## 2017-12-29 NOTE — Telephone Encounter (Signed)
Daughter wanted to know how much calcium patient needed to be taking. I reviewed with Dr. Walden Field who said that patient should go back to taking what he was previously. 1 in am and 1 in pm. Daughter verbalized understanding.

## 2017-12-29 NOTE — Patient Instructions (Signed)
Take coumadin 3 tablets x 3 days then 5mg  x 2 days untill INR check on Monday 9/16 Started Ensure 2 cans daily about a week ago. Is going to check into the price of Eliquis 5mg  bid at [facility]

## 2017-12-30 ENCOUNTER — Other Ambulatory Visit (HOSPITAL_COMMUNITY): Payer: Self-pay | Admitting: Internal Medicine

## 2017-12-30 ENCOUNTER — Ambulatory Visit (HOSPITAL_COMMUNITY)
Admission: RE | Admit: 2017-12-30 | Discharge: 2017-12-30 | Disposition: A | Discharge status: Home / Self Care | Payer: Medicare Other | Source: Ambulatory Visit | Attending: Internal Medicine | Admitting: Internal Medicine

## 2017-12-30 DIAGNOSIS — R0789 Other chest pain: Secondary | ICD-10-CM | POA: Diagnosis not present

## 2017-12-30 DIAGNOSIS — W19XXXA Unspecified fall, initial encounter: Secondary | ICD-10-CM | POA: Diagnosis not present

## 2017-12-30 DIAGNOSIS — R079 Chest pain, unspecified: Secondary | ICD-10-CM

## 2017-12-30 DIAGNOSIS — E1165 Type 2 diabetes mellitus with hyperglycemia: Secondary | ICD-10-CM | POA: Diagnosis not present

## 2017-12-30 DIAGNOSIS — S299XXA Unspecified injury of thorax, initial encounter: Secondary | ICD-10-CM | POA: Diagnosis not present

## 2018-01-03 ENCOUNTER — Ambulatory Visit (HOSPITAL_COMMUNITY): Payer: Medicare Other

## 2018-01-03 ENCOUNTER — Ambulatory Visit (INDEPENDENT_AMBULATORY_CARE_PROVIDER_SITE_OTHER): Payer: Medicare Other | Admitting: *Deleted

## 2018-01-03 ENCOUNTER — Ambulatory Visit (HOSPITAL_COMMUNITY): Payer: Medicare Other | Admitting: Hematology

## 2018-01-03 DIAGNOSIS — Z5181 Encounter for therapeutic drug level monitoring: Secondary | ICD-10-CM | POA: Diagnosis not present

## 2018-01-03 DIAGNOSIS — I4891 Unspecified atrial fibrillation: Secondary | ICD-10-CM

## 2018-01-03 DIAGNOSIS — I480 Paroxysmal atrial fibrillation: Secondary | ICD-10-CM | POA: Diagnosis not present

## 2018-01-03 LAB — POCT INR: INR: 3.2 — AB (ref 2.0–3.0)

## 2018-01-03 NOTE — Telephone Encounter (Signed)
-----   Message from Arnoldo Lenis, MD sent at 12/30/2017 11:28 AM EDT ----- Yes, that change would be fine with me if you could please arrange for him.  Zandra Abts MD ----- Message ----- From: Malen Gauze, RN Sent: 12/29/2017   3:51 PM EDT To: Arnoldo Lenis, MD  Pt is now interested in switching to Eliquis if it is affordable.  Are you still OK with this.  Meets criteria for 5mg  bid based on age ,weight and SCr. Thanks, Lattie Haw

## 2018-01-03 NOTE — Patient Instructions (Signed)
Start coumadin 2 tablets daily except 3 tablets on Tuesdays and Fridays Started Ensure 2 cans daily about a week ago. Continue Ensure Is going to check into the price of Eliquis 5mg  bid at [facility]:  $400/month.  Checking other alternatives Starting a Multivitamin without Vit K

## 2018-01-04 ENCOUNTER — Ambulatory Visit (HOSPITAL_COMMUNITY): Payer: Medicare Other | Admitting: Hematology

## 2018-01-04 ENCOUNTER — Ambulatory Visit (HOSPITAL_COMMUNITY): Payer: Medicare Other

## 2018-01-10 DIAGNOSIS — E1142 Type 2 diabetes mellitus with diabetic polyneuropathy: Secondary | ICD-10-CM | POA: Diagnosis not present

## 2018-01-10 DIAGNOSIS — B351 Tinea unguium: Secondary | ICD-10-CM | POA: Diagnosis not present

## 2018-01-14 ENCOUNTER — Ambulatory Visit (INDEPENDENT_AMBULATORY_CARE_PROVIDER_SITE_OTHER): Payer: Medicare Other | Admitting: *Deleted

## 2018-01-14 DIAGNOSIS — I480 Paroxysmal atrial fibrillation: Secondary | ICD-10-CM | POA: Diagnosis not present

## 2018-01-14 DIAGNOSIS — Z5181 Encounter for therapeutic drug level monitoring: Secondary | ICD-10-CM | POA: Diagnosis not present

## 2018-01-14 DIAGNOSIS — I4891 Unspecified atrial fibrillation: Secondary | ICD-10-CM | POA: Diagnosis not present

## 2018-01-14 LAB — POCT INR: INR: 2 (ref 2.0–3.0)

## 2018-01-14 NOTE — Patient Instructions (Signed)
Continue coumadin 2 tablets daily except 3 tablets on Tuesdays and Fridays Continue Ensure 2 cans daily Is going to check into the price of Eliquis 5mg  bid at [facility]:  $400/month.  Checking other alternatives Started a Multivitamin without Vit K

## 2018-01-24 ENCOUNTER — Other Ambulatory Visit: Payer: Self-pay | Admitting: Neurology

## 2018-01-25 ENCOUNTER — Other Ambulatory Visit (HOSPITAL_COMMUNITY): Payer: Medicare Other

## 2018-01-25 ENCOUNTER — Ambulatory Visit (HOSPITAL_COMMUNITY): Payer: Medicare Other | Admitting: Hematology

## 2018-01-25 ENCOUNTER — Ambulatory Visit (HOSPITAL_COMMUNITY): Payer: Medicare Other

## 2018-01-31 ENCOUNTER — Emergency Department (HOSPITAL_COMMUNITY): Payer: Medicare Other

## 2018-01-31 ENCOUNTER — Encounter (HOSPITAL_COMMUNITY): Payer: Self-pay | Admitting: Emergency Medicine

## 2018-01-31 ENCOUNTER — Inpatient Hospital Stay (HOSPITAL_COMMUNITY)
Admission: EM | Admit: 2018-01-31 | Discharge: 2018-02-03 | DRG: 070 | Disposition: A | Discharge status: Home Health | Payer: Medicare Other | Attending: Internal Medicine | Admitting: Internal Medicine

## 2018-01-31 ENCOUNTER — Other Ambulatory Visit: Payer: Self-pay

## 2018-01-31 DIAGNOSIS — Z9049 Acquired absence of other specified parts of digestive tract: Secondary | ICD-10-CM

## 2018-01-31 DIAGNOSIS — S51811A Laceration without foreign body of right forearm, initial encounter: Secondary | ICD-10-CM

## 2018-01-31 DIAGNOSIS — C7951 Secondary malignant neoplasm of bone: Secondary | ICD-10-CM | POA: Diagnosis present

## 2018-01-31 DIAGNOSIS — I739 Peripheral vascular disease, unspecified: Secondary | ICD-10-CM | POA: Diagnosis present

## 2018-01-31 DIAGNOSIS — R4781 Slurred speech: Secondary | ICD-10-CM | POA: Diagnosis not present

## 2018-01-31 DIAGNOSIS — E1151 Type 2 diabetes mellitus with diabetic peripheral angiopathy without gangrene: Secondary | ICD-10-CM | POA: Diagnosis present

## 2018-01-31 DIAGNOSIS — M25531 Pain in right wrist: Secondary | ICD-10-CM | POA: Diagnosis not present

## 2018-01-31 DIAGNOSIS — Y92003 Bedroom of unspecified non-institutional (private) residence as the place of occurrence of the external cause: Secondary | ICD-10-CM

## 2018-01-31 DIAGNOSIS — S299XXA Unspecified injury of thorax, initial encounter: Secondary | ICD-10-CM | POA: Diagnosis not present

## 2018-01-31 DIAGNOSIS — G629 Polyneuropathy, unspecified: Secondary | ICD-10-CM | POA: Diagnosis present

## 2018-01-31 DIAGNOSIS — R54 Age-related physical debility: Secondary | ICD-10-CM | POA: Diagnosis present

## 2018-01-31 DIAGNOSIS — G9340 Encephalopathy, unspecified: Secondary | ICD-10-CM | POA: Diagnosis present

## 2018-01-31 DIAGNOSIS — I4891 Unspecified atrial fibrillation: Secondary | ICD-10-CM | POA: Diagnosis present

## 2018-01-31 DIAGNOSIS — Z803 Family history of malignant neoplasm of breast: Secondary | ICD-10-CM

## 2018-01-31 DIAGNOSIS — Z800 Family history of malignant neoplasm of digestive organs: Secondary | ICD-10-CM

## 2018-01-31 DIAGNOSIS — E538 Deficiency of other specified B group vitamins: Secondary | ICD-10-CM | POA: Diagnosis present

## 2018-01-31 DIAGNOSIS — R569 Unspecified convulsions: Secondary | ICD-10-CM | POA: Diagnosis not present

## 2018-01-31 DIAGNOSIS — C8590 Non-Hodgkin lymphoma, unspecified, unspecified site: Secondary | ICD-10-CM | POA: Diagnosis present

## 2018-01-31 DIAGNOSIS — R58 Hemorrhage, not elsewhere classified: Secondary | ICD-10-CM | POA: Diagnosis not present

## 2018-01-31 DIAGNOSIS — Z89412 Acquired absence of left great toe: Secondary | ICD-10-CM | POA: Diagnosis not present

## 2018-01-31 DIAGNOSIS — J690 Pneumonitis due to inhalation of food and vomit: Secondary | ICD-10-CM

## 2018-01-31 DIAGNOSIS — I4821 Permanent atrial fibrillation: Secondary | ICD-10-CM | POA: Diagnosis present

## 2018-01-31 DIAGNOSIS — I10 Essential (primary) hypertension: Secondary | ICD-10-CM | POA: Diagnosis present

## 2018-01-31 DIAGNOSIS — R4182 Altered mental status, unspecified: Secondary | ICD-10-CM | POA: Diagnosis not present

## 2018-01-31 DIAGNOSIS — I6523 Occlusion and stenosis of bilateral carotid arteries: Secondary | ICD-10-CM | POA: Diagnosis not present

## 2018-01-31 DIAGNOSIS — M1990 Unspecified osteoarthritis, unspecified site: Secondary | ICD-10-CM | POA: Diagnosis present

## 2018-01-31 DIAGNOSIS — J168 Pneumonia due to other specified infectious organisms: Secondary | ICD-10-CM | POA: Diagnosis not present

## 2018-01-31 DIAGNOSIS — S199XXA Unspecified injury of neck, initial encounter: Secondary | ICD-10-CM | POA: Diagnosis not present

## 2018-01-31 DIAGNOSIS — M7989 Other specified soft tissue disorders: Secondary | ICD-10-CM | POA: Diagnosis not present

## 2018-01-31 DIAGNOSIS — E119 Type 2 diabetes mellitus without complications: Secondary | ICD-10-CM

## 2018-01-31 DIAGNOSIS — C61 Malignant neoplasm of prostate: Secondary | ICD-10-CM | POA: Diagnosis present

## 2018-01-31 DIAGNOSIS — Z7984 Long term (current) use of oral hypoglycemic drugs: Secondary | ICD-10-CM

## 2018-01-31 DIAGNOSIS — Z7901 Long term (current) use of anticoagulants: Secondary | ICD-10-CM

## 2018-01-31 DIAGNOSIS — Z8500 Personal history of malignant neoplasm of unspecified digestive organ: Secondary | ICD-10-CM

## 2018-01-31 DIAGNOSIS — Z808 Family history of malignant neoplasm of other organs or systems: Secondary | ICD-10-CM

## 2018-01-31 DIAGNOSIS — S0990XA Unspecified injury of head, initial encounter: Secondary | ICD-10-CM | POA: Diagnosis not present

## 2018-01-31 DIAGNOSIS — S59911A Unspecified injury of right forearm, initial encounter: Secondary | ICD-10-CM | POA: Diagnosis not present

## 2018-01-31 DIAGNOSIS — Z89422 Acquired absence of other left toe(s): Secondary | ICD-10-CM

## 2018-01-31 DIAGNOSIS — W06XXXA Fall from bed, initial encounter: Secondary | ICD-10-CM | POA: Diagnosis present

## 2018-01-31 DIAGNOSIS — Z9221 Personal history of antineoplastic chemotherapy: Secondary | ICD-10-CM | POA: Diagnosis not present

## 2018-01-31 DIAGNOSIS — C49A0 Gastrointestinal stromal tumor, unspecified site: Secondary | ICD-10-CM | POA: Diagnosis present

## 2018-01-31 DIAGNOSIS — Z7952 Long term (current) use of systemic steroids: Secondary | ICD-10-CM

## 2018-01-31 DIAGNOSIS — Z8249 Family history of ischemic heart disease and other diseases of the circulatory system: Secondary | ICD-10-CM

## 2018-01-31 DIAGNOSIS — R4789 Other speech disturbances: Secondary | ICD-10-CM | POA: Diagnosis not present

## 2018-01-31 DIAGNOSIS — D472 Monoclonal gammopathy: Secondary | ICD-10-CM | POA: Diagnosis present

## 2018-01-31 DIAGNOSIS — E1165 Type 2 diabetes mellitus with hyperglycemia: Secondary | ICD-10-CM | POA: Diagnosis not present

## 2018-01-31 DIAGNOSIS — W19XXXA Unspecified fall, initial encounter: Secondary | ICD-10-CM

## 2018-01-31 DIAGNOSIS — G9341 Metabolic encephalopathy: Secondary | ICD-10-CM | POA: Diagnosis not present

## 2018-01-31 DIAGNOSIS — Z85828 Personal history of other malignant neoplasm of skin: Secondary | ICD-10-CM

## 2018-01-31 DIAGNOSIS — E876 Hypokalemia: Secondary | ICD-10-CM | POA: Diagnosis present

## 2018-01-31 DIAGNOSIS — J189 Pneumonia, unspecified organism: Secondary | ICD-10-CM

## 2018-01-31 DIAGNOSIS — Z79899 Other long term (current) drug therapy: Secondary | ICD-10-CM | POA: Diagnosis not present

## 2018-01-31 DIAGNOSIS — G63 Polyneuropathy in diseases classified elsewhere: Secondary | ICD-10-CM

## 2018-01-31 DIAGNOSIS — Z87442 Personal history of urinary calculi: Secondary | ICD-10-CM

## 2018-01-31 DIAGNOSIS — R471 Dysarthria and anarthria: Secondary | ICD-10-CM | POA: Diagnosis present

## 2018-01-31 DIAGNOSIS — G8190 Hemiplegia, unspecified affecting unspecified side: Secondary | ICD-10-CM | POA: Diagnosis not present

## 2018-01-31 DIAGNOSIS — K219 Gastro-esophageal reflux disease without esophagitis: Secondary | ICD-10-CM | POA: Diagnosis present

## 2018-01-31 DIAGNOSIS — E1169 Type 2 diabetes mellitus with other specified complication: Secondary | ICD-10-CM | POA: Diagnosis not present

## 2018-01-31 DIAGNOSIS — Z880 Allergy status to penicillin: Secondary | ICD-10-CM

## 2018-01-31 DIAGNOSIS — I340 Nonrheumatic mitral (valve) insufficiency: Secondary | ICD-10-CM | POA: Diagnosis not present

## 2018-01-31 DIAGNOSIS — Z8572 Personal history of non-Hodgkin lymphomas: Secondary | ICD-10-CM

## 2018-01-31 DIAGNOSIS — Z23 Encounter for immunization: Secondary | ICD-10-CM | POA: Diagnosis not present

## 2018-01-31 HISTORY — DX: Pneumonitis due to inhalation of food and vomit: J69.0

## 2018-01-31 LAB — URINALYSIS, ROUTINE W REFLEX MICROSCOPIC
Bilirubin Urine: NEGATIVE
Glucose, UA: NEGATIVE mg/dL
Hgb urine dipstick: NEGATIVE
Ketones, ur: NEGATIVE mg/dL
Nitrite: NEGATIVE
Protein, ur: NEGATIVE mg/dL
Specific Gravity, Urine: 1.027 (ref 1.005–1.030)
pH: 5 (ref 5.0–8.0)

## 2018-01-31 LAB — CBC WITH DIFFERENTIAL/PLATELET
Abs Immature Granulocytes: 0.28 10*3/uL — ABNORMAL HIGH (ref 0.00–0.07)
Basophils Absolute: 0 10*3/uL (ref 0.0–0.1)
Basophils Relative: 0 %
Eosinophils Absolute: 0 10*3/uL (ref 0.0–0.5)
Eosinophils Relative: 0 %
HCT: 40.9 % (ref 39.0–52.0)
Hemoglobin: 14.4 g/dL (ref 13.0–17.0)
Immature Granulocytes: 3 %
Lymphocytes Relative: 15 %
Lymphs Abs: 1.7 10*3/uL (ref 0.7–4.0)
MCH: 31 pg (ref 26.0–34.0)
MCHC: 35.2 g/dL (ref 30.0–36.0)
MCV: 88.1 fL (ref 80.0–100.0)
Monocytes Absolute: 1.8 10*3/uL — ABNORMAL HIGH (ref 0.1–1.0)
Monocytes Relative: 16 %
Neutro Abs: 7.4 10*3/uL (ref 1.7–7.7)
Neutrophils Relative %: 66 %
Platelets: 176 10*3/uL (ref 150–400)
RBC: 4.64 MIL/uL (ref 4.22–5.81)
RDW: 12.8 % (ref 11.5–15.5)
WBC: 11.2 10*3/uL — ABNORMAL HIGH (ref 4.0–10.5)
nRBC: 0 % (ref 0.0–0.2)

## 2018-01-31 LAB — LIPID PANEL
Cholesterol: 147 mg/dL (ref 0–200)
HDL: 50 mg/dL (ref >40)
LDL Cholesterol: 75 mg/dL (ref 0–99)
Total CHOL/HDL Ratio: 2.9 RATIO
Triglycerides: 109 mg/dL (ref <150)
VLDL: 22 mg/dL (ref 0–40)

## 2018-01-31 LAB — COMPREHENSIVE METABOLIC PANEL
ALT: 15 U/L (ref 0–44)
AST: 20 U/L (ref 15–41)
Albumin: 4.1 g/dL (ref 3.5–5.0)
Alkaline Phosphatase: 72 U/L (ref 38–126)
Anion gap: 10 (ref 5–15)
BUN: 15 mg/dL (ref 8–23)
CO2: 27 mmol/L (ref 22–32)
Calcium: 9.7 mg/dL (ref 8.9–10.3)
Chloride: 100 mmol/L (ref 98–111)
Creatinine, Ser: 0.76 mg/dL (ref 0.61–1.24)
GFR calc Af Amer: >60 mL/min (ref >60)
GFR calc non Af Amer: >60 mL/min (ref >60)
Glucose, Bld: 202 mg/dL — ABNORMAL HIGH (ref 70–99)
Potassium: 3.1 mmol/L — ABNORMAL LOW (ref 3.5–5.1)
Sodium: 137 mmol/L (ref 135–145)
Total Bilirubin: 1.2 mg/dL (ref 0.3–1.2)
Total Protein: 7.2 g/dL (ref 6.5–8.1)

## 2018-01-31 LAB — AMMONIA: Ammonia: 23 umol/L (ref 9–35)

## 2018-01-31 LAB — HEMOGLOBIN A1C
Hgb A1c MFr Bld: 6.3 % — ABNORMAL HIGH (ref 4.8–5.6)
Mean Plasma Glucose: 134.11 mg/dL

## 2018-01-31 LAB — CBG MONITORING, ED: Glucose-Capillary: 206 mg/dL — ABNORMAL HIGH (ref 70–99)

## 2018-01-31 LAB — GLUCOSE, CAPILLARY: Glucose-Capillary: 158 mg/dL — ABNORMAL HIGH (ref 70–99)

## 2018-01-31 LAB — PROTIME-INR
INR: 1.99
Prothrombin Time: 22.5 seconds — ABNORMAL HIGH (ref 11.4–15.2)

## 2018-01-31 LAB — CK: Total CK: 39 U/L — ABNORMAL LOW (ref 49–397)

## 2018-01-31 LAB — VITAMIN B12: Vitamin B-12: 265 pg/mL (ref 180–914)

## 2018-01-31 LAB — FOLATE: Folate: 21.8 ng/mL (ref >5.9)

## 2018-01-31 LAB — TSH: TSH: 4.363 u[IU]/mL (ref 0.350–4.500)

## 2018-01-31 MED ORDER — WARFARIN SODIUM 5 MG PO TABS: 6.0000 mg | ORAL_TABLET | Freq: Once | ORAL | Status: DC

## 2018-01-31 MED ORDER — METRONIDAZOLE IN NACL 5-0.79 MG/ML-% IV SOLN
500.0000 mg | Freq: Three times a day (TID) | INTRAVENOUS | Status: DC
  Administered 2018-01-31 – 2018-02-03 (×9): 500 mg via INTRAVENOUS
  Filled 2018-01-31 (×9): qty 100

## 2018-01-31 MED ORDER — INSULIN ASPART 100 UNIT/ML ~~LOC~~ SOLN
0.0000 [IU] | Freq: Four times a day (QID) | SUBCUTANEOUS | Status: DC
  Administered 2018-01-31 – 2018-02-02 (×4): 2 [IU] via SUBCUTANEOUS
  Administered 2018-02-02: 1 [IU] via SUBCUTANEOUS
  Administered 2018-02-03: 3 [IU] via SUBCUTANEOUS
  Administered 2018-02-03: 2 [IU] via SUBCUTANEOUS

## 2018-01-31 MED ORDER — ONDANSETRON HCL 4 MG PO TABS
4.0000 mg | ORAL_TABLET | Freq: Four times a day (QID) | ORAL | Status: DC | PRN
  Filled 2018-01-31: qty 1

## 2018-01-31 MED ORDER — WARFARIN SODIUM 2.5 MG PO TABS: 2.5000 mg | ORAL_TABLET | Freq: Every day | ORAL | Status: DC

## 2018-01-31 MED ORDER — SODIUM CHLORIDE 0.9 % IV SOLN
1.0000 g | Freq: Once | INTRAVENOUS | Status: AC
  Administered 2018-01-31: 1 g via INTRAVENOUS
  Filled 2018-01-31: qty 10

## 2018-01-31 MED ORDER — ONDANSETRON HCL 4 MG/2ML IJ SOLN
4.0000 mg | Freq: Four times a day (QID) | INTRAMUSCULAR | Status: DC | PRN
  Administered 2018-02-01: 4 mg via INTRAVENOUS
  Filled 2018-01-31: qty 2

## 2018-01-31 MED ORDER — THIAMINE HCL 100 MG/ML IJ SOLN
100.0000 mg | Freq: Every day | INTRAMUSCULAR | Status: DC
  Administered 2018-01-31 – 2018-02-03 (×4): 100 mg via INTRAVENOUS
  Filled 2018-01-31 (×4): qty 2

## 2018-01-31 MED ORDER — SODIUM CHLORIDE 0.9 % IV SOLN
1.0000 g | INTRAVENOUS | Status: DC
  Administered 2018-02-01 – 2018-02-03 (×3): 1 g via INTRAVENOUS
  Filled 2018-01-31 (×2): qty 1
  Filled 2018-01-31: qty 10

## 2018-01-31 MED ORDER — INSULIN ASPART 100 UNIT/ML ~~LOC~~ SOLN: 0.0000 [IU] | Freq: Every day | SUBCUTANEOUS | Status: DC

## 2018-01-31 MED ORDER — DILTIAZEM HCL 60 MG PO TABS
90.0000 mg | ORAL_TABLET | Freq: Two times a day (BID) | ORAL | Status: DC
  Administered 2018-02-02 – 2018-02-03 (×3): 90 mg via ORAL
  Filled 2018-01-31 (×4): qty 1

## 2018-01-31 MED ORDER — INFLUENZA VAC SPLIT HIGH-DOSE 0.5 ML IM SUSY
0.5000 mL | PREFILLED_SYRINGE | INTRAMUSCULAR | Status: AC
  Administered 2018-02-02: 0.5 mL via INTRAMUSCULAR
  Filled 2018-01-31 (×2): qty 0.5

## 2018-01-31 MED ORDER — IOPAMIDOL (ISOVUE-370) INJECTION 76%
75.0000 mL | Freq: Once | INTRAVENOUS | Status: AC | PRN
  Administered 2018-01-31: 75 mL via INTRAVENOUS

## 2018-01-31 MED ORDER — WARFARIN - PHARMACIST DOSING INPATIENT: Freq: Every day | Status: DC

## 2018-01-31 MED ORDER — KCL IN DEXTROSE-NACL 40-5-0.45 MEQ/L-%-% IV SOLN
INTRAVENOUS | Status: AC
  Administered 2018-01-31: 19:00:00 via INTRAVENOUS

## 2018-01-31 MED ORDER — ACETAMINOPHEN 325 MG PO TABS: 650.0000 mg | ORAL_TABLET | Freq: Four times a day (QID) | ORAL | Status: DC | PRN

## 2018-01-31 MED ORDER — ACETAMINOPHEN 650 MG RE SUPP: 650.0000 mg | Freq: Four times a day (QID) | RECTAL | Status: DC | PRN

## 2018-01-31 NOTE — ED Triage Notes (Signed)
Pt brought in from fall in bathroom at home.  Pt has skin tears to right arm and abrasion to head.  Pt has garbled speech and is unclear what pt's baseline mentation is or how long this has been occurring.  Family is on way to ED.

## 2018-01-31 NOTE — ED Notes (Signed)
Upon further assessment pt appears to be neglecting right side of body.  Will respond to stimuli on left, but not on right.  Speech is garbled and unable to follow commands to further assess for stroke.  Pupils unequal left 1mm and right 36mm.  [place] made aware.

## 2018-01-31 NOTE — ED Notes (Signed)
Pt linen and gown changed due to blood, urine, and feces. New brief placed on pt also. Pt pulled off ekg leads multiple times so they have been left off.

## 2018-01-31 NOTE — ED Notes (Signed)
Dr Manuella Ghazi at bedside, family present.

## 2018-01-31 NOTE — ED Provider Notes (Signed)
[facility] ([facility]) EMERGENCY DEPARTMENT Provider Note   CSN: [id number] Arrival date & time: 01/31/18  1054     History   Chief Complaint Chief Complaint  Patient presents with  . Fall    HPI [name] is a 84 y.o. male.  HPI Level 5 caveat due to altered mental status. Patient brought in after being found down.  Reportedly had a fall.  Found on the floor in bathroom.  [place] this morning but wife had last seen last night but does not think he would have fallen last night.  Has had increased confusion somewhat since yesterday when he was unable to clearly follow football game he is watching.  Does have some dementia has been getting worse over the last month.  He is on anticoagulation for atrial fibrillation. Past Medical History:  Diagnosis Date  . Acute bronchitis   . Arthritis   . Atrial fibrillation ([place])    Dr. Harl Bowie- [place] follows saw 11'14  . Bladder stones    tx. with oral meds and antibiotics, now surgery planned  . Cataracts, both eyes    surgery planned May 2015  . Diabetes mellitus without complication ([place])    Type II  . Dyspnea    with activity  . Dysrhythmia    Afib  . Family history of breast cancer   . Family history of colon cancer   . GERD (gastroesophageal reflux disease)   . 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 Margot Chimes, thus far without recurrence.   . Hypertension   . NHL (non-Hodgkin's lymphoma) ([place]) 2005   Diffuse large B-cell lymphoma, clinically stage IIIA, CD20 positive, status post cervical lymph node biopsy 08/03/2003 on the left. PET scan was also positive in the spleen and small bowel region, but bone marrow aspiration and biopsy were negative. So he essentially had stage IIIAs. He received R-CHOP x6  cycles with CR established by PET scan criteria on 11/23/2003 with no evidence for relapse th  . PAD (peripheral artery disease) ([place]) 08/27/2017  . Skin cancer    Basal cell- face,head, neck,  arms, legs, Back    Patient Active Problem List   Diagnosis Date Noted  . Diarrhea 11/25/2017  . Acute lower UTI 11/25/2017  . PAD (peripheral artery disease) ([place]) 08/27/2017  . Great toe amputation status, left 07/30/2017  . Osteomyelitis of toe of left foot ([place]) 06/29/2017  . Acute bronchitis with bronchospasm 03/16/2017  . Type 2 diabetes mellitus ([place]) 03/16/2017  . Hypomagnesemia 03/16/2017  . 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 Postural dizziness with presyncope 02/19/2017  . Polyneuropathy 02/19/2017  . Numbness 02/19/2017  . Urinary frequency 02/19/2017  . Genetic testing 02/09/2017  . Family history of breast cancer   . Family history of colon cancer   . Other dysphagia 01/21/2016  . Bone metastases ([place]) 08/21/2015  . Prostate cancer ([place]) 09/08/2013  . Hypertrophy of prostate with urinary obstruction and other lower urinary tract symptoms (LUTS) 08/10/2013  . Encounter for therapeutic drug monitoring 06/12/2013  . NHL (non-Hodgkin's lymphoma) ([place]) 08/18/2011  . GIST (gastrointestinal stromal tumor), malignant ([place]) 08/18/2011  . Long term (current) use of anticoagulants 07/18/2010  . Essential hypertension 10/04/2008  . ATRIAL FIBRILLATION 10/04/2008    Past Surgical History:  Procedure Laterality  Date  . AMPUTATION Left 06/29/2017   Procedure: AMPUTATION LEFT GREAT TOE;  Surgeon: [name], MD;  Location: [place];  Service: Vascular;  Laterality: Left;  . AMPUTATION Left 08/27/2017   Procedure: AMPUTATION Left SECOND TOE;  Surgeon: [name], MD;  Location: [place];  Service: Vascular;  Laterality: Left;  . CATARACT EXTRACTION Bilateral 2015  . CHOLECYSTECTOMY    . COLON RESECTION     small bowel  . CYSTOSCOPY WITH LITHOLAPAXY N/A 08/10/2013     Procedure: CYSTOSCOPY WITH LITHOLAPAXY WITH [name];  Surgeon: [name], MD;  Location: [facility];  Service: Urology;  Laterality: N/A;  . ESOPHAGEAL DILATION N/A 02/27/2016   Procedure: ESOPHAGEAL DILATION;  Surgeon: [name], MD;  [facility];  Service: Endoscopy;  Laterality: N/A;  . ESOPHAGOGASTRODUODENOSCOPY N/A 02/27/2016   Procedure: ESOPHAGOGASTRODUODENOSCOPY (EGD);  Surgeon: [name], MD;  [facility];  Service: Endoscopy;  Laterality: N/A;  12:00  . INCISION AND DRAINAGE PERIRECTAL ABSCESS    . LOWER EXTREMITY ANGIOGRAPHY N/A 06/25/2017   Procedure: LOWER EXTREMITY ANGIOGRAPHY;  Surgeon: [name], MD;  Location: [place] [facility];  Service: Cardiovascular;  Laterality: N/A;  . LYMPH NODE DISSECTION Left    '05-neck  . PORT-A-CATH REMOVAL    . PORTACATH PLACEMENT     insertion and removal -last chemotherapy 10 yrs ago  . TRANSURETHRAL RESECTION OF PROSTATE N/A 08/10/2013   Procedure: TRANSURETHRAL RESECTION OF THE PROSTATE WITH GYRUS INSTRUMENTS;  Surgeon: [name], MD;  Location: [facility];  Service: Urology;  Laterality: N/A;        Home Medications    Prior to Admission medications   Medication Sig Start Date End Date Taking? Authorizing Provider  abiraterone acetate (ZYTIGA) 250 MG tablet Take 4 tablets (1,000 mg total) by mouth daily. Take on an empty stomach 1 hour before or 2 hours after a meal 11/26/17  Yes [name], MD  amitriptyline (ELAVIL) 25 MG tablet Take 25 mg by mouth at bedtime.   Yes [provider]  CALCIUM CARB-CHOLECALCIFEROL PO Take 1 tablet by mouth daily.    Yes [provider]  CRANBERRY PO Take 4,200 mg by mouth daily.   Yes [provider]  cyanocobalamin (,VITAMIN B-12,) 1000 MCG/ML injection Inject 1,000 mcg every 30 (thirty) days into the skin. 02/25/17  Yes [provider]  denosumab (XGEVA) 120 MG/1.7ML SOLN injection Inject 120 mg into the  skin every 30 (thirty) days.    Yes [provider]  diltiazem (CARDIZEM) 90 MG tablet TAKE 1 TABLET BY MOUTH TWICE DAILY -- **NEEDS APPOINTMENT FOR FURTHER REFILLS** 12/03/17  Yes Branch, Alphonse Guild, MD  diltiazem (CARDIZEM) 90 MG tablet TAKE 1 TABLET BY MOUTH TWICE DAILY -- **NEEDS APPOINTMENT FOR FURTHER REFILLS** 12/09/17  Yes Branch, Alphonse Guild, MD  fluconazole (DIFLUCAN) 100 MG tablet Take 100 mg by mouth as needed.   Yes [provider]  furosemide (LASIX) 20 MG tablet Take 1 tablet (20 mg total) by mouth daily. 03/18/17 03/18/18 Yes Kathie Dike, MD  glipiZIDE (GLUCOTROL XL) 5 MG 24 hr tablet Take 5 mg by mouth daily with breakfast.  06/25/15  Yes [provider]  guaiFENesin (MUCINEX) 600 MG 12 hr tablet Take 600 mg by mouth as needed.   Yes [provider]  lisinopril (PRINIVIL,ZESTRIL) 20 MG tablet Take 1 tablet (20 mg total) daily by mouth. 02/25/17  Yes Sater, [name], MD  metFORMIN (GLUCOPHAGE) 850 MG tablet  Take 500 mg by mouth 2 (two) times daily with a meal.  03/23/17  Yes [provider]  mirtazapine (REMERON) 15 MG tablet Take 15 mg by mouth at bedtime. 1/2 of '15mg'$    Yes [provider]  omeprazole (PRILOSEC) 40 MG capsule Take 40 mg by mouth daily. 12/14/17  Yes [provider]  potassium chloride (K-DUR,KLOR-CON) 10 MEQ tablet Take 2 tablets (20 mEq total) by mouth daily. 06/21/17  Yes Higgs, Mathis Dad, MD  predniSONE (DELTASONE) 5 MG tablet Take 0.5 tablets (2.5 mg total) by mouth 2 (two) times daily with a meal. 12/16/17  Yes [name], MD  warfarin (COUMADIN) 2.5 MG tablet Take 2 - 3 tablets daily as directed by the anticoagulation clinic 12/29/17  Yes Branch, Alphonse Guild, MD  acetaminophen (TYLENOL) 500 MG tablet Take 500-1,000 mg by mouth daily as needed for mild pain, fever or headache.    [provider]  apixaban (ELIQUIS) 5 MG TABS tablet Take 1 tablet (5 mg total) by mouth 2 (two) times  daily. Patient not taking: Reported on 01/31/2018 12/29/17   Arnoldo Lenis, MD  clotrimazole-betamethasone (LOTRISONE) cream Apply 1 application topically daily as needed (for fungus).  06/18/15   [provider]  diphenhydrAMINE (BENADRYL) 25 MG tablet Take 25 mg by mouth every 6 (six) hours as needed for itching.    [provider]  docusate sodium (COLACE) 100 MG capsule Take 100 mg by mouth daily as needed for mild constipation.    [provider]  Leuprolide Acetate, 6 Month, (LUPRON DEPOT) 45 MG injection Inject 45 mg into the muscle every 6 (six) months.    [provider]  loperamide (IMODIUM) 2 MG capsule Take 2 mg by mouth as needed for diarrhea or loose stools.    [provider]  nystatin (MYCOSTATIN) powder Apply 1 g topically 4 (four) times daily as needed (fungus).     [provider]  phenylephrine-shark liver oil-mineral oil-petrolatum (PREPARATION H) 0.25-3-14-71.9 % rectal ointment Place 1 application rectally 2 (two) times daily as needed (for chaffing).    [provider]  sennosides-docusate sodium (SENOKOT-S) 8.6-50 MG tablet Take 1 tablet by mouth daily as needed for constipation.     [provider]  VENTOLIN HFA 108 (90 Base) MCG/ACT inhaler Inhale 1-2 puffs into the lungs every 6 (six) hours as needed for wheezing or shortness of breath.  03/08/17   [provider]    Family History Family History  Problem Relation Age of Onset  . Heart attack Mother        died in her 64s  . Other Father 46       pneumonia - died  . Breast cancer Sister        dx in her 34s-60s  . Colon cancer Brother        dx in his 36s  . Cancer Sister        TAH/BSO due to "male cancer"  . Breast cancer Daughter 55  . Breast cancer Daughter 52       DCIS - ATM VUS on [employer] 83 gene panel in 2018  . Breast cancer Daughter 31       DCIS - Negative on [employer] MyRisk 25 gene apnel in 2015  . Thyroid cancer  Daughter 26       Medullary and Papillary  . Thyroid nodules Grandchild     Social History Social History   Tobacco Use  . Smoking status: Never  Smoker  . Smokeless tobacco: Never Used  Substance Use Topics  . Alcohol use: No  . Drug use: No     Allergies   Augmentin [amoxicillin-pot clavulanate]   Review of Systems Review of Systems  Unable to perform ROS: Mental status change     Physical Exam Updated Vital Signs BP 107/62   Pulse 99   Temp 97.6 F (36.4 C) (Axillary)   Resp 20   Ht 6' (1.829 m)   Wt 70 kg   SpO2 98%   BMI 20.93 kg/m   Physical Exam  Constitutional: He appears well-developed.  HENT:  Abrasion to occipital area.  Eyes:  Patient will look to left to voice but will not cross midline to the right.  Neck:  No deformity.  Cervical collar in place.  Cardiovascular: Normal rate.  Pulmonary/Chest: Effort normal. He has no wheezes.  Abdominal: There is no tenderness.  Musculoskeletal:  Ecchymosis and skin tear to right forearm and wrist.  No elbow tenderness.  No shoulder tenderness.  No tenderness over hips knees or ankles.  Neurological:  Patient will look to voice.  Will not follow commands for me.  Will not cross the midline to the right but is moving the right arm and leg somewhat.  Also moving left-sided extremities.  Skin: Skin is warm.     ED Treatments / Results  Labs (all labs ordered are listed, but only abnormal results are displayed) Labs Reviewed  COMPREHENSIVE METABOLIC PANEL - Abnormal; Notable for the following components:      Result Value   Potassium 3.1 (*)    Glucose, Bld 202 (*)    All other components within normal limits  PROTIME-INR - Abnormal; Notable for the following components:   Prothrombin Time 22.5 (*)    All other components within normal limits  CBC WITH DIFFERENTIAL/PLATELET - Abnormal; Notable for the following components:   WBC 11.2 (*)    Monocytes Absolute 1.8 (*)    Abs Immature Granulocytes  0.28 (*)    All other components within normal limits  CBG MONITORING, ED - Abnormal; Notable for the following components:   Glucose-Capillary 206 (*)    All other components within normal limits  URINALYSIS, ROUTINE W REFLEX MICROSCOPIC    EKG None  Radiology Ct Angio Head W Or Wo Contrast  Result Date: 01/31/2018 CLINICAL DATA:  Right-sided neglect. Speech disturbance. History of fall. No acute finding on previous head CT. EXAM: CT ANGIOGRAPHY HEAD AND NECK TECHNIQUE: Multidetector CT imaging of the head and neck was performed using the standard protocol during bolus administration of intravenous contrast. Multiplanar CT image reconstructions and MIPs were obtained to evaluate the vascular anatomy. Carotid stenosis measurements (when applicable) are obtained utilizing NASCET criteria, using the distal internal carotid diameter as the denominator. CONTRAST:  73m ISOVUE-370 IOPAMIDOL (ISOVUE-370) INJECTION 76% COMPARISON:  CT earlier same day. FINDINGS: CTA NECK FINDINGS Aortic arch: Aortic atherosclerosis. No aneurysm or dissection. 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 No origin stenoses identified. Right carotid system: Common carotid artery widely patent to the bifurcation region. Atherosclerotic calcification at the carotid bifurcation. Minimal proximal ICA diameter 5.8 mm, therefore no stenosis. Cervical ICA widely patent beyond that. Left carotid system: Common carotid artery widely patent to the bifurcation. Calcified plaque at the carotid bifurcation and ICA bulb. Minimal ICA diameter 6 mm, therefore no stenosis. Cervical ICA widely patent beyond that. Vertebral arteries: Right vertebral artery is  dominant with a normal origin. Left vertebral artery originates from the arch as noted above. Both vessels are widely patent at their origins and through the cervical region to the foramen magnum. Skeleton: Ordinary  mid cervical spondylosis. Other neck: No mass or lymphadenopathy. Upper chest: Mild emphysema and scarring.  No active process. Review of the MIP images confirms the above findings CTA HEAD FINDINGS Anterior circulation: Both internal carotid arteries are patent through the skull base and siphon regions. There is ordinary peripheral calcification in the carotid siphon regions but no stenosis. The anterior and middle cerebral vessels are patent without proximal stenosis, aneurysm or vascular malformation. No missing branch vessels are identified. Posterior circulation: Left vertebral artery terminates in PICA. Right vertebral artery supplies the basilar. No basilar stenosis. Posterior circulation branch vessels are normal. Fetal origin right PCA. Venous sinuses: Patent and normal. Anatomic variants: None significant. Delayed phase: No abnormal enhancement. Review of the MIP images confirms the above findings IMPRESSION: No large or medium vessel occlusion identified. Nonstenotic atherosclerotic disease of the aortic arch, carotid bifurcations and carotid siphon regions. These results were called by telephone at the time of interpretation on 01/31/2018 at 2:12 pm to Dr. Davonna Belling , who verbally acknowledged these results. Electronically Signed   By: Nelson Chimes M.D.   On: 01/31/2018 14:16   Dg Chest 2 View  Result Date: 01/31/2018 CLINICAL DATA:  84 year old male with a history of fall EXAM: CHEST - 2 VIEW COMPARISON:  12/30/2017, 11/25/2017 FINDINGS: Cardiomediastinal silhouette unchanged in size and contour. Reticular opacity of the left lower lobe partially obscuring left hemidiaphragm in the left heart border, on the frontal and lateral views. No pleural effusion. No pneumothorax. No significant interlobular septal thickening. No displaced fracture. IMPRESSION: Airspace opacity of the left lower lobe concerning for infection. Electronically Signed   By: Corrie Mckusick D.O.   On: 01/31/2018 13:46   Dg  Forearm Right  Result Date: 01/31/2018 CLINICAL DATA:  Right forearm swelling after fall today. EXAM: RIGHT FOREARM - 2 VIEW COMPARISON:  None. FINDINGS: There is no evidence of fracture or other focal bone lesions. Vascular calcifications are noted. Otherwise soft tissues are unremarkable. IMPRESSION: No acute abnormality seen in the right forearm. Electronically Signed   By: Marijo Conception, M.D.   On: 01/31/2018 13:46   Dg Wrist Complete Right  Result Date: 01/31/2018 CLINICAL DATA:  Right wrist swelling after fall today. EXAM: RIGHT WRIST - COMPLETE 3+ VIEW COMPARISON:  None. FINDINGS: There is no evidence of fracture or dislocation. There is no evidence of arthropathy or other focal bone abnormality. Soft tissues are unremarkable. IMPRESSION: Negative. Electronically Signed   By: Marijo Conception, M.D.   On: 01/31/2018 13:47   Ct Head Wo Contrast  Result Date: 01/31/2018 CLINICAL DATA:  84 year old male post fall.  Initial encounter. EXAM: CT HEAD WITHOUT CONTRAST CT CERVICAL SPINE WITHOUT CONTRAST TECHNIQUE: Multidetector CT imaging of the head and cervical spine was performed following the standard protocol without intravenous contrast. Multiplanar CT image reconstructions of the cervical spine were also generated. COMPARISON:  09/29/2017 head CT.  No comparison cervical spine CT. FINDINGS: CT HEAD FINDINGS Brain: No intracranial hemorrhage or CT evidence of large acute infarct. Chronic microvascular changes. Global atrophy. No intracranial mass lesion noted on this unenhanced exam. Vascular: Vascular calcifications.  No hyperdense vessel. Skull: No skull fracture. Sinuses/Orbits: Post lens replacement. No acute orbital abnormality. Visualized paranasal sinuses clear. Other: Partial opacification right mastoid air cells without obstructing lesion of  eustachian tube. This was noted previously. Soft tissue swelling right occipital region. CT CERVICAL SPINE FINDINGS Alignment: Minimal curvature.  Skull base and vertebrae: No cervical spine fracture. Soft tissues and spinal canal: No abnormal prevertebral soft tissue swelling. Disc levels: Cervical spondylotic changes most notable C5-6 with mild spinal stenosis and slight cord flattening C4-5 through C6-7. Upper chest: No worrisome lung mass. Other: Carotid bifurcation calcifications. IMPRESSION: 1. No skull fracture or intracranial hemorrhage. 2. No cervical spine fracture or abnormal prevertebral soft tissue swelling. 3. Chronic changes as detailed above. Electronically Signed   By: Genia Del M.D.   On: 01/31/2018 11:56   Ct Angio Neck W And/or Wo Contrast  Result Date: 01/31/2018 CLINICAL DATA:  Right-sided neglect. Speech disturbance. History of fall. No acute finding on previous head CT. EXAM: CT ANGIOGRAPHY HEAD AND NECK TECHNIQUE: Multidetector CT imaging of the head and neck was performed using the standard protocol during bolus administration of intravenous contrast. Multiplanar CT image reconstructions and MIPs were obtained to evaluate the vascular anatomy. Carotid stenosis measurements (when applicable) are obtained utilizing NASCET criteria, using the distal internal carotid diameter as the denominator. CONTRAST:  21m ISOVUE-370 IOPAMIDOL (ISOVUE-370) INJECTION 76% COMPARISON:  CT earlier same day. FINDINGS: CTA NECK FINDINGS Aortic arch: Aortic atherosclerosis. No aneurysm or dissection. 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 No origin stenoses identified. Right carotid system: Common carotid artery widely patent to the bifurcation region. Atherosclerotic calcification at the carotid bifurcation. Minimal proximal ICA diameter 5.8 mm, therefore no stenosis. Cervical ICA widely patent beyond that. Left carotid system: Common carotid artery widely patent to the bifurcation. Calcified plaque at the carotid bifurcation and ICA bulb. Minimal ICA diameter 6 mm,  therefore no stenosis. Cervical ICA widely patent beyond that. Vertebral arteries: Right vertebral artery is dominant with a normal origin. Left vertebral artery originates from the arch as noted above. Both vessels are widely patent at their origins and through the cervical region to the foramen magnum. Skeleton: Ordinary mid cervical spondylosis. Other neck: No mass or lymphadenopathy. Upper chest: Mild emphysema and scarring.  No active process. Review of the MIP images confirms the above findings CTA HEAD FINDINGS Anterior circulation: Both internal carotid arteries are patent through the skull base and siphon regions. There is ordinary peripheral calcification in the carotid siphon regions but no stenosis. The anterior and middle cerebral vessels are patent without proximal stenosis, aneurysm or vascular malformation. No missing branch vessels are identified. Posterior circulation: Left vertebral artery terminates in PICA. Right vertebral artery supplies the basilar. No basilar stenosis. Posterior circulation branch vessels are normal. Fetal origin right PCA. Venous sinuses: Patent and normal. Anatomic variants: None significant. Delayed phase: No abnormal enhancement. Review of the MIP images confirms the above findings IMPRESSION: No large or medium vessel occlusion identified. Nonstenotic atherosclerotic disease of the aortic arch, carotid bifurcations and carotid siphon regions. These results were called by telephone at the time of interpretation on 01/31/2018 at 2:12 pm to Dr. NDavonna Belling, who verbally acknowledged these results. Electronically Signed   By: MNelson ChimesM.D.   On: 01/31/2018 14:16   Ct Cervical Spine Wo Contrast  Result Date: 01/31/2018 CLINICAL DATA:  84year old male post fall.  Initial encounter. EXAM: CT HEAD WITHOUT CONTRAST CT CERVICAL SPINE WITHOUT CONTRAST TECHNIQUE: Multidetector CT imaging of the head and cervical spine was performed following the standard protocol  without intravenous contrast. Multiplanar CT image reconstructions of the  cervical spine were also generated. COMPARISON:  09/29/2017 head CT.  No comparison cervical spine CT. FINDINGS: CT HEAD FINDINGS Brain: No intracranial hemorrhage or CT evidence of large acute infarct. Chronic microvascular changes. Global atrophy. No intracranial mass lesion noted on this unenhanced exam. Vascular: Vascular calcifications.  No hyperdense vessel. Skull: No skull fracture. Sinuses/Orbits: Post lens replacement. No acute orbital abnormality. Visualized paranasal sinuses clear. Other: Partial opacification right mastoid air cells without obstructing lesion of eustachian tube. This was noted previously. Soft tissue swelling right occipital region. CT CERVICAL SPINE FINDINGS Alignment: Minimal curvature. Skull base and vertebrae: No cervical spine fracture. Soft tissues and spinal canal: No abnormal prevertebral soft tissue swelling. Disc levels: Cervical spondylotic changes most notable C5-6 with mild spinal stenosis and slight cord flattening C4-5 through C6-7. Upper chest: No worrisome lung mass. Other: Carotid bifurcation calcifications. IMPRESSION: 1. No skull fracture or intracranial hemorrhage. 2. No cervical spine fracture or abnormal prevertebral soft tissue swelling. 3. Chronic changes as detailed above. Electronically Signed   By: Genia Del M.D.   On: 01/31/2018 11:56    Procedures Procedures (including critical care time)  Medications Ordered in ED Medications  cefTRIAXone (ROCEPHIN) 1 g in sodium chloride 0.9 % 100 mL IVPB (has no administration in time range)  iopamidol (ISOVUE-370) 76 % injection 75 mL (75 mLs Intravenous Contrast Given 01/31/18 1340)     Initial Impression / Assessment and Plan / ED Course  I have reviewed the triage vital signs and the nursing notes.  Pertinent labs & imaging results that were available during my care of the patient were reviewed by me and considered in my  medical decision making (see chart for details).     Patient with fall.  Appears to neglect the right side.  Last normal potentially yesterday but has had some mental status changes and reportedly has some difficulty understanding football yesterday.  X-ray shows pneumonia.  Head CT and CTA reassuring.  Peripheral imaging negative for fracture but does have skin tear.  Nursing was unable to pass Foley catheter.  200 cc in the bladder.  Condom catheter placed.  With mental status changes neurology consult was done.  The recommendation are in the chart and also request inpatient neurology consult.  Will admit to internal medicine.  Patient is therapeutic on his Coumadin.  CRITICAL CARE Performed by: Davonna Belling Total critical care time: 30 minutes Critical care time was exclusive of separately billable procedures and treating other patients. Critical care was necessary to treat or prevent imminent or life-threatening deterioration. Critical care was time spent personally by me on the following activities: development of treatment plan with patient and/or surrogate as well as nursing, discussions with consultants, evaluation of patient's response to treatment, examination of patient, obtaining history from patient or surrogate, ordering and performing treatments and interventions, ordering and review of laboratory studies, ordering and review of radiographic studies, pulse oximetry and re-evaluation of patient's condition.   Final Clinical Impressions(s) / ED Diagnoses   Final diagnoses:  Fall, initial encounter  Skin tear of right forearm without complication, initial encounter  Pneumonia due to infectious organism, unspecified laterality, unspecified part of lung  Encephalopathy    ED Discharge Orders    None       Davonna Belling, MD 01/31/18 1427

## 2018-01-31 NOTE — Consult Note (Signed)
[name] A. Merlene Laughter, MD     [email or web address]          [name] is an 84 y.o. male.   ASSESSMENT/PLAN: 1.  Acute impairment in mentation/encephalopathy of unclear etiology: Etiology includes postconcussive syndrome, seizures, ischemic infarct not seen on imaging and medication effect.  An EEG will be obtained.  Additional labs will be obtained.  Brain MRI will also be obtained.  Minimize and reduce her psychotropic medications including Remeron, amitriptyline and Benadryl.  2.  Functional deterioration of cognition and ambulation in the last few months status post hospitalizations for various reasons: Etiology likely multifactorial including aging.  3.  Vitamin B12 deficiency per testing done a year ago: This will be repeated.  4.  Likely malnourished: Thiamine replacement has been initiated     The patient is a 83 year old white male who has had somewhat of a stepwise deterioration in cognitive abilities and ambulation over the last 10 months or so.  The history is obtained from the daughter.  The patient has been hospitalized for various reasons over the last several months including for amputation of the toes.  The last hospitalization occurred because of dehydration.  Daughter reports that this seemed to resulted in significant impairment in his cognition and ability to move around.  He seems to have not recovered from the last visit in particular.  However, he was able to converse and was so interested in things on television.  He would get around by using a walker.  This morning approximately 10 the patient was found in an area that he typically does not go in the house.  He was found in the "girls restroom".  He was found on the floor confused and unresponsive.  It is uncertain how long he was there.  He was discovered by his wife.  The daughter reports that he has had impaired hearing but does not use his hearing aid.  He has not had issues with falling  frequently although he has had deterioration in ability to ambulate.  He has had to gone to a walker recently.  The review of systems is limited given his impaired cognition.   GENERAL: This a frail male who appears to be in some discomfort but no acute distress.  HEENT: Neck is supple no clear head trauma seen.  ABDOMEN: soft  EXTREMITIES: No edema; there is significant areas of bruising involving the upper extremities.  There is marked through the changes of the knees bilaterally especially the left.  BACK: This is unrevealing for age.  SKIN: Normal by inspection.    MENTAL STATUS: The patient lays in bed with eyes closed.  He opens his eyes to light sternal rub.  He does state that he cannot hear but keeps saying "honey" he does follow commands briskly.  CRANIAL NERVES: Pupils are equal, round and reactive to light; extra ocular movements are full, there is no significant nystagmus; visual fields are full; upper and lower facial muscles are normal in strength and symmetric, there is no flattening of the nasolabial folds; tongue is midline  MOTOR: He moves both sides well.  Bulk and tone appears to be normal.  COORDINATION: Left finger to nose is normal, right finger to nose is normal, No rest tremor; no intention tremor; no postural tremor; no bradykinesia.  REFLEXES: Deep tendon reflexes are symmetrical and normal but diminished in the legs.  SENSATION: Normal to pain.  GAIT: Normal.     Blood pressure 107/62, pulse 99, temperature  97.6 F (36.4 C), temperature source Axillary, resp. rate 20, height 6' (1.829 m), weight 70 kg, SpO2 98 %.  Past Medical History:  Diagnosis Date  . Acute bronchitis   . Arthritis   . Atrial fibrillation ([place])    Dr. Harl Bowie- [facility] follows saw 11'14  . Bladder stones    tx. with oral meds and antibiotics, now surgery planned  . Cataracts, both eyes    surgery planned May 2015  . Diabetes mellitus without complication ([place])    Type II  .  Dyspnea    with activity  . Dysrhythmia    Afib  . Family history of breast cancer   . Family history of colon cancer   . GERD (gastroesophageal reflux disease)   . 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 Margot Chimes, thus far without recurrence.   . Hypertension   . NHL (non-Hodgkin's lymphoma) ([place]) 2005   Diffuse large B-cell lymphoma, clinically stage IIIA, CD20 positive, status post cervical lymph node biopsy 08/03/2003 on the left. PET scan was also positive in the spleen and small bowel region, but bone marrow aspiration and biopsy were negative. So he essentially had stage IIIAs. He received R-CHOP x6 cycles with CR established by PET scan criteria on 11/23/2003 with no evidence for relapse th  . PAD (peripheral artery disease) ([place]) 08/27/2017  . Skin cancer    Basal cell- face,head, neck,  arms, legs, Back    Past Surgical History:  Procedure Laterality Date  . AMPUTATION Left 06/29/2017   Procedure: AMPUTATION LEFT GREAT TOE;  Surgeon: [name], MD;  [facility];  Service: Vascular;  Laterality: Left;  . AMPUTATION Left 08/27/2017   Procedure: AMPUTATION Left SECOND TOE;  Surgeon: [name], MD;  Location: [facility];  Service: Vascular;  Laterality: Left;  . CATARACT EXTRACTION Bilateral 2015  . CHOLECYSTECTOMY    . COLON RESECTION     small bowel  . CYSTOSCOPY WITH LITHOLAPAXY N/A 08/10/2013   Procedure: CYSTOSCOPY WITH LITHOLAPAXY WITH [name];  Surgeon: [name], MD;  [facility];  Service: Urology;  Laterality: N/A;  . ESOPHAGEAL DILATION N/A 02/27/2016   Procedure: ESOPHAGEAL DILATION;  Surgeon: [name], MD;  [facility];  Service: Endoscopy;  Laterality: N/A;  . ESOPHAGOGASTRODUODENOSCOPY  N/A 02/27/2016   Procedure: ESOPHAGOGASTRODUODENOSCOPY (EGD);  Surgeon: [name], MD;  [facility];  Service: Endoscopy;  Laterality: N/A;  12:00  . INCISION AND DRAINAGE PERIRECTAL ABSCESS    . LOWER EXTREMITY ANGIOGRAPHY N/A 06/25/2017   Procedure: LOWER EXTREMITY ANGIOGRAPHY;  Surgeon: [name], MD;  Location: [facility];  Service: Cardiovascular;  Laterality: N/A;  . LYMPH NODE DISSECTION Left    '05-neck  . PORT-A-CATH REMOVAL    . PORTACATH PLACEMENT     insertion and removal -last chemotherapy 10 yrs ago  . TRANSURETHRAL RESECTION OF PROSTATE N/A 08/10/2013   Procedure: TRANSURETHRAL RESECTION OF THE PROSTATE WITH GYRUS INSTRUMENTS;  Surgeon: [name], MD;  Location[facility];  Service: Urology;  Laterality: N/A;    Family History  Problem Relation Age of Onset  . Heart attack Mother        died in her 65s  . Other Father 55  pneumonia - died  . Breast cancer Sister        dx in her 32s-60s  . Colon cancer Brother        dx in his 35s  . Cancer Sister        TAH/BSO due to "male cancer"  . Breast cancer Daughter 40  . Breast cancer Daughter 47       DCIS - ATM VUS on [employer] 83 gene panel in 2018  . Breast cancer Daughter 63       DCIS - Negative on [employer] MyRisk 25 gene apnel in 2015  . Thyroid cancer Daughter 42       Medullary and Papillary  . Thyroid nodules Grandchild     Social History:  reports that he has never smoked. He has never used smokeless tobacco. He reports that he does not drink alcohol or use drugs.  Allergies:  Allergies  Allergen Reactions  . Augmentin [Amoxicillin-Pot Clavulanate] Rash    Redness of skin    Medications: Prior to Admission medications   Medication Sig Start Date End Date Taking? Authorizing Provider  abiraterone acetate (ZYTIGA) 250 MG tablet Take 4 tablets (1,000 mg total) by mouth daily. Take on an empty stomach 1 hour before or 2 hours after a meal 11/26/17  Yes [name], MD  amitriptyline (ELAVIL) 25 MG tablet Take 25 mg by mouth at bedtime.   Yes [provider]  CALCIUM CARB-CHOLECALCIFEROL PO Take 1 tablet by mouth daily.    Yes [provider]  CRANBERRY PO Take 4,200 mg by mouth daily.   Yes [provider]  cyanocobalamin (,VITAMIN B-12,) 1000 MCG/ML injection Inject 1,000 mcg every 30 (thirty) days into the skin. 02/25/17  Yes [provider]  denosumab (XGEVA) 120 MG/1.7ML SOLN injection Inject 120 mg into the skin every 30 (thirty) days.    Yes [provider]  diltiazem (CARDIZEM) 90 MG tablet TAKE 1 TABLET BY MOUTH TWICE DAILY -- **NEEDS APPOINTMENT FOR FURTHER REFILLS** 12/03/17  Yes Branch, Alphonse Guild, MD  diltiazem (CARDIZEM) 90 MG tablet TAKE 1 TABLET BY MOUTH TWICE DAILY -- **NEEDS APPOINTMENT FOR FURTHER REFILLS** 12/09/17  Yes Branch, Alphonse Guild, MD  fluconazole (DIFLUCAN) 100 MG tablet Take 100 mg by mouth as needed.   Yes [provider]  furosemide (LASIX) 20 MG tablet Take 1 tablet (20 mg total) by mouth daily. 03/18/17 03/18/18 Yes Kathie Dike, MD  glipiZIDE (GLUCOTROL XL) 5 MG 24 hr tablet Take 5 mg by mouth daily with breakfast.  06/25/15  Yes [provider]  guaiFENesin (MUCINEX) 600 MG 12 hr tablet Take 600 mg by mouth as needed.   Yes [provider]  lisinopril (PRINIVIL,ZESTRIL) 20 MG tablet Take 1 tablet (20 mg total) daily by mouth. 02/25/17  Yes Sater, [name], MD  metFORMIN (GLUCOPHAGE) 850 MG tablet Take 500 mg by mouth 2 (two) times daily with a meal.  03/23/17  Yes [provider]  mirtazapine (REMERON) 15 MG tablet Take 15 mg by mouth at bedtime. 1/2 of '15mg'$    Yes [provider]  omeprazole (PRILOSEC) 40 MG capsule Take 40 mg by mouth daily. 12/14/17  Yes [provider]  potassium chloride (K-DUR,KLOR-CON) 10 MEQ tablet Take 2 tablets (20 mEq total) by mouth daily. 06/21/17  Yes Higgs, Mathis Dad, MD  predniSONE (DELTASONE) 5  MG tablet Take 0.5 tablets (2.5 mg total) by mouth 2 (two) times daily with a meal. 12/16/17  Yes [name], MD  warfarin (COUMADIN) 2.5 MG tablet Take 2 - 3 tablets daily as directed by the anticoagulation clinic 12/29/17  Yes Branch, Alphonse Guild, MD  acetaminophen (TYLENOL) 500 MG tablet Take 500-1,000 mg by mouth daily as needed for mild pain, fever or headache.    [provider]  apixaban (ELIQUIS) 5 MG TABS tablet Take 1 tablet (5 mg total) by mouth 2 (two) times daily. Patient not taking: Reported on 01/31/2018 12/29/17   Arnoldo Lenis, MD  clotrimazole-betamethasone (LOTRISONE) cream Apply 1 application topically daily as needed (for fungus).  06/18/15   [provider]  diphenhydrAMINE (BENADRYL) 25 MG tablet Take 25 mg by mouth every 6 (six) hours as needed for itching.    [provider]  docusate sodium (COLACE) 100 MG capsule Take 100 mg by mouth daily as needed for mild constipation.    [provider]  Leuprolide Acetate, 6 Month, (LUPRON DEPOT) 45 MG injection Inject 45 mg into the muscle every 6 (six) months.    [provider]  loperamide (IMODIUM) 2 MG capsule Take 2 mg by mouth as needed for diarrhea or loose stools.    [provider]  nystatin (MYCOSTATIN) powder Apply 1 g topically 4 (four) times daily as needed (fungus).     [provider]  phenylephrine-shark liver oil-mineral oil-petrolatum (PREPARATION H) 0.25-3-14-71.9 % rectal ointment Place 1 application rectally 2 (two) times daily as needed (for chaffing).    [provider]  sennosides-docusate sodium (SENOKOT-S) 8.6-50 MG tablet Take 1 tablet by mouth daily as needed for constipation.     [provider]  VENTOLIN HFA 108 (90 Base) MCG/ACT inhaler Inhale 1-2 puffs into the lungs every 6 (six) hours as needed for wheezing or shortness of breath.  03/08/17   [provider]    Scheduled Meds: . diltiazem  90 mg Oral  Q12H  . [START ON 02/01/2018] Influenza vac split quadrivalent PF  0.5 mL Intramuscular Tomorrow-1000  . insulin aspart  0-5 Units Subcutaneous QHS  . insulin aspart  0-9 Units Subcutaneous Q6H  . warfarin  6 mg Oral ONCE-1800  . Warfarin - Pharmacist Dosing Inpatient   Does not apply q1800   Continuous Infusions: . [START ON 02/01/2018] cefTRIAXone (ROCEPHIN)  IV    . dextrose 5 % and 0.45 % NaCl with KCl 40 mEq/L    . metronidazole     PRN Meds:.acetaminophen **OR** acetaminophen, ondansetron **OR** ondansetron (ZOFRAN) IV     Results for orders placed or performed during the hospital encounter of 01/31/18 (from the past 48 hour(s))  CBG monitoring, ED     Status: Abnormal   Collection Time: 01/31/18 11:08 AM  Result Value Ref Range   Glucose-Capillary 206 (H) 70 - 99 mg/dL  Urinalysis, Routine w reflex microscopic     Status: Abnormal   Collection Time: 01/31/18 11:10 AM  Result Value Ref Range   Color, Urine YELLOW YELLOW   APPearance HAZY (A) CLEAR   Specific Gravity, Urine 1.027 1.005 - 1.030   pH 5.0 5.0 - 8.0   Glucose, UA NEGATIVE NEGATIVE mg/dL   Hgb urine dipstick NEGATIVE NEGATIVE   Bilirubin Urine NEGATIVE NEGATIVE   Ketones, ur NEGATIVE NEGATIVE mg/dL   Protein, ur NEGATIVE NEGATIVE mg/dL   Nitrite NEGATIVE NEGATIVE   Leukocytes, UA TRACE (A) NEGATIVE   RBC / HPF 0-5 0 - 5 RBC/hpf   WBC, UA 0-5 0 - 5 WBC/hpf   Bacteria, UA RARE (A) NONE SEEN  Mucus PRESENT     Comment: Performed at [facility], [address] [place], [place] [address]  Comprehensive metabolic panel     Status: Abnormal   Collection Time: 01/31/18 11:44 AM  Result Value Ref Range   Sodium 137 135 - 145 mmol/L   Potassium 3.1 (L) 3.5 - 5.1 mmol/L   Chloride 100 98 - 111 mmol/L   CO2 27 22 - 32 mmol/L   Glucose, Bld 202 (H) 70 - 99 mg/dL   BUN 15 8 - 23 mg/dL   Creatinine, Ser 0.76 0.61 - 1.24 mg/dL   Calcium 9.7 8.9 - 10.3 mg/dL   Total Protein 7.2 6.5 - 8.1 g/dL   Albumin 4.1 3.5 -  5.0 g/dL   AST 20 15 - 41 U/L   ALT 15 0 - 44 U/L   Alkaline Phosphatase 72 38 - 126 U/L   Total Bilirubin 1.2 0.3 - 1.2 mg/dL   GFR calc non Af Amer >60 >60 mL/min   GFR calc Af Amer >60 >60 mL/min    Comment: (NOTE) The eGFR has been calculated using the CKD EPI equation. This calculation has not been validated in all clinical situations. eGFR's persistently <60 mL/min signify possible Chronic Kidney Disease.    Anion gap 10 5 - 15    Comment: Performed at [facility], [address], [place], [place] [address]  Protime-INR     Status: Abnormal   Collection Time: 01/31/18 11:44 AM  Result Value Ref Range   Prothrombin Time 22.5 (H) 11.4 - 15.2 seconds   INR 1.99     Comment: Performed at [facility], [address], [place], [place] [address]  CBC with Differential     Status: Abnormal   Collection Time: 01/31/18 11:44 AM  Result Value Ref Range   WBC 11.2 (H) 4.0 - 10.5 K/uL   RBC 4.64 4.22 - 5.81 MIL/uL   Hemoglobin 14.4 13.0 - 17.0 g/dL   HCT 40.9 39.0 - 52.0 %   MCV 88.1 80.0 - 100.0 fL   MCH 31.0 26.0 - 34.0 pg   MCHC 35.2 30.0 - 36.0 g/dL   RDW 12.8 11.5 - 15.5 %   Platelets 176 150 - 400 K/uL   nRBC 0.0 0.0 - 0.2 %   Neutrophils Relative % 66 %   Neutro Abs 7.4 1.7 - 7.7 K/uL   Lymphocytes Relative 15 %   Lymphs Abs 1.7 0.7 - 4.0 K/uL   Monocytes Relative 16 %   Monocytes Absolute 1.8 (H) 0.1 - 1.0 K/uL   Eosinophils Relative 0 %   Eosinophils Absolute 0.0 0.0 - 0.5 K/uL   Basophils Relative 0 %   Basophils Absolute 0.0 0.0 - 0.1 K/uL   Immature Granulocytes 3 %   Abs Immature Granulocytes 0.28 (H) 0.00 - 0.07 K/uL    Comment: Performed at [facility], [address] [place], [place] [address]  TSH     Status: None   Collection Time: 01/31/18 12:17 PM  Result Value Ref Range   TSH 4.363 0.350 - 4.500 uIU/mL    Comment: Performed by a 3rd Generation assay with a functional sensitivity of <=0.01 uIU/mL. Performed at [facility], [address]  [place], [place] [address]   Lipid panel     Status: None   Collection Time: 01/31/18 12:17 PM  Result Value Ref Range   Cholesterol 147 0 - 200 mg/dL   Triglycerides 109 <150 mg/dL   HDL 50 >40 mg/dL  Total CHOL/HDL Ratio 2.9 RATIO   VLDL 22 0 - 40 mg/dL   LDL Cholesterol 75 0 - 99 mg/dL    Comment:        Total Cholesterol/HDL:CHD Risk Coronary Heart Disease Risk Table                     Men   Women  1/2 Average Risk   3.4   3.3  Average Risk       5.0   4.4  2 X Average Risk   9.6   7.1  3 X Average Risk  23.4   11.0        Use the calculated Patient Ratio above and the CHD Risk Table to determine the patient's CHD Risk.        ATP III CLASSIFICATION (LDL):  <100     mg/dL   Optimal  100-129  mg/dL   Near or Above                    Optimal  130-159  mg/dL   Borderline  160-189  mg/dL   High  >190     mg/dL   Very High Performed at [facility], [address] [place], [place] [address]   CK     Status: Abnormal   Collection Time: 01/31/18 12:17 PM  Result Value Ref Range   Total CK 39 (L) 49 - 397 U/L    Comment: Performed at [facility], [address], [place], [place] [address]  Folate     Status: None   Collection Time: 01/31/18  5:19 PM  Result Value Ref Range   Folate 21.8 >5.9 ng/mL    Comment: Performed at [facility], [address] [place],  [address]    Studies/Results: HEAD NECK CTA FINDINGS: CTA NECK FINDINGS  Aortic arch: Aortic atherosclerosis. No aneurysm or dissection. 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 No origin stenoses identified.  Right carotid system: Common carotid artery widely patent to the bifurcation region. Atherosclerotic calcification at the carotid bifurcation. Minimal proximal ICA diameter 5.8 mm, therefore no stenosis. Cervical ICA widely patent beyond that.  Left carotid system: Common carotid artery widely patent to  the bifurcation. Calcified plaque at the carotid bifurcation and ICA bulb. Minimal ICA diameter 6 mm, therefore no stenosis. Cervical ICA widely patent beyond that.  Vertebral arteries: Right vertebral artery is dominant with a normal origin. Left vertebral artery originates from the arch as noted above. Both vessels are widely patent at their origins and through the cervical region to the foramen magnum.  Skeleton: Ordinary mid cervical spondylosis.  Other neck: No mass or lymphadenopathy.  Upper chest: Mild emphysema and scarring.  No active process.  Review of the MIP images confirms the above findings  CTA HEAD FINDINGS  Anterior circulation: Both internal carotid arteries are patent through the skull base and siphon regions. There is ordinary peripheral calcification in the carotid siphon regions but no stenosis. The anterior and middle cerebral vessels are patent without proximal stenosis, aneurysm or vascular malformation. No missing branch vessels are identified.  Posterior circulation: Left vertebral artery terminates in PICA. Right vertebral artery supplies the basilar. No basilar stenosis. Posterior circulation branch vessels are normal. Fetal origin right PCA.  Venous sinuses: Patent and normal.  Anatomic variants: None significant.  Delayed phase: No abnormal enhancement.  Review of the MIP images confirms the above  findings  IMPRESSION: No large or medium vessel occlusion identified.  Nonstenotic atherosclerotic disease of the aortic arch, carotid bifurcations and carotid siphon regions.         HEAD  AND C SPINE CT FINDINGS: CT HEAD FINDINGS  Brain: No intracranial hemorrhage or CT evidence of large acute infarct. Chronic microvascular changes. Global atrophy. No intracranial mass lesion noted on this unenhanced exam.  Vascular: Vascular calcifications.  No hyperdense vessel.  Skull: No skull fracture.  Sinuses/Orbits:  Post lens replacement. No acute orbital abnormality. Visualized paranasal sinuses clear.  Other: Partial opacification right mastoid air cells without obstructing lesion of eustachian tube. This was noted previously. Soft tissue swelling right occipital region.  CT CERVICAL SPINE FINDINGS  Alignment: Minimal curvature.  Skull base and vertebrae: No cervical spine fracture.  Soft tissues and spinal canal: No abnormal prevertebral soft tissue swelling.  Disc levels: Cervical spondylotic changes most notable C5-6 with mild spinal stenosis and slight cord flattening C4-5 through C6-7.  Upper chest: No worrisome lung mass.  Other: Carotid bifurcation calcifications.  IMPRESSION: 1. No skull fracture or intracranial hemorrhage. 2. No cervical spine fracture or abnormal prevertebral soft tissue swelling. 3. Chronic changes as detailed above.      CT of the head along with CTA are reviewed.  The CT shows marked periventricular and deep white matter leukoencephalopathy.  There is no acute infarcts noted or any acute processes.  There is marked global atrophy.  No large vessel disease is appreciated.  There is a fetal circulation of the left PCA.    [name] A. Merlene Foster, M.D.  Diplomate, Tax adviser of Psychiatry and Neurology ( Neurology). 01/31/2018, 7:14 PM

## 2018-01-31 NOTE — Progress Notes (Addendum)
ANTICOAGULATION CONSULT NOTE - Initial Consult  Pharmacy Consult for warfarin Indication: atrial fibrillation  Allergies  Allergen Reactions  . Augmentin [Amoxicillin-Pot Clavulanate] Rash    Redness of skin    Patient Measurements: Height: 6' (182.9 cm) Weight: 154 lb 5.2 oz (70 kg) IBW/kg (Calculated) : 77.6  Vital Signs: Temp: 97.6 F (36.4 C) (10/14 1057) Temp Source: Axillary (10/14 1057) BP: 107/62 (10/14 1245) Pulse Rate: 99 (10/14 1245)  Labs: Recent Labs    01/31/18 1144  HGB 14.4  HCT 40.9  PLT 176  LABPROT 22.5*  INR 1.99  CREATININE 0.76    Estimated Creatinine Clearance: 58.3 mL/min (by C-G formula based on SCr of 0.76 mg/dL).   Medical History: Past Medical History:  Diagnosis Date  . Acute bronchitis   . Arthritis   . Atrial fibrillation ([place])    Dr. Harl Bowie- [facility] follows saw 11'14  . Bladder stones    tx. with oral meds and antibiotics, now surgery planned  . Cataracts, both eyes    surgery planned May 2015  . Diabetes mellitus without complication ([place])    Type II  . Dyspnea    with activity  . Dysrhythmia    Afib  . Family history of breast cancer   . Family history of colon cancer   . GERD (gastroesophageal reflux disease)   . 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 Margot Chimes, thus far without recurrence.   . Hypertension   . NHL (non-Hodgkin's lymphoma) ([place]) 2005   Diffuse large B-cell lymphoma, clinically stage IIIA, CD20 positive, status post cervical lymph node biopsy 08/03/2003 on the left. PET scan was also positive in the spleen and small bowel region, but bone marrow aspiration and biopsy were negative. So he essentially had stage IIIAs. He received R-CHOP x6 cycles with CR established by PET  scan criteria on 11/23/2003 with no evidence for relapse th  . PAD (peripheral artery disease) ([place]) 08/27/2017  . Skin cancer    Basal cell- face,head, neck,  arms, legs, Back    Medications:  Medications Prior to Admission  Medication Sig Dispense Refill Last Dose  . abiraterone acetate (ZYTIGA) 250 MG tablet Take 4 tablets (1,000 mg total) by mouth daily. Take on an empty stomach 1 hour before or 2 hours after a meal 120 tablet 6 01/31/2018 at Unknown time  . amitriptyline (ELAVIL) 25 MG tablet Take 25 mg by mouth at bedtime.   01/30/2018 at Unknown time  . CALCIUM CARB-CHOLECALCIFEROL PO Take 1 tablet by mouth daily.    01/31/2018 at Unknown time  . CRANBERRY PO Take 4,200 mg by mouth daily.   01/31/2018 at Unknown time  . cyanocobalamin (,VITAMIN B-12,) 1000 MCG/ML injection Inject 1,000 mcg every 30 (thirty) days into the skin.   Past Month at Unknown time  . denosumab (XGEVA) 120 MG/1.7ML SOLN injection Inject 120 mg into the skin every 30 (thirty) days.    Past Month at Unknown time  . diltiazem (CARDIZEM) 90 MG tablet TAKE 1 TABLET BY MOUTH TWICE DAILY -- **NEEDS APPOINTMENT FOR FURTHER REFILLS** 60 tablet 0 01/31/2018 at Unknown time  . diltiazem (CARDIZEM) 90 MG tablet TAKE 1 TABLET BY MOUTH TWICE DAILY -- **NEEDS APPOINTMENT FOR FURTHER REFILLS** 60 tablet 0 01/30/2018 at Unknown time  . fluconazole (  DIFLUCAN) 100 MG tablet Take 100 mg by mouth as needed.   unknown  . furosemide (LASIX) 20 MG tablet Take 1 tablet (20 mg total) by mouth daily. 30 tablet 11 01/31/2018 at Unknown time  . glipiZIDE (GLUCOTROL XL) 5 MG 24 hr tablet Take 5 mg by mouth daily with breakfast.    01/31/2018 at Unknown time  . guaiFENesin (MUCINEX) 600 MG 12 hr tablet Take 600 mg by mouth as needed.   unknown  . lisinopril (PRINIVIL,ZESTRIL) 20 MG tablet Take 1 tablet (20 mg total) daily by mouth. 30 tablet 11 01/31/2018 at Unknown time  . metFORMIN (GLUCOPHAGE) 850 MG tablet Take 500 mg by mouth 2 (two) times  daily with a meal.    01/31/2018 at Unknown time  . mirtazapine (REMERON) 15 MG tablet Take 15 mg by mouth at bedtime. 1/2 of 15mg    01/30/2018 at Unknown time  . omeprazole (PRILOSEC) 40 MG capsule Take 40 mg by mouth daily.  3 01/31/2018 at Unknown time  . potassium chloride (K-DUR,KLOR-CON) 10 MEQ tablet Take 2 tablets (20 mEq total) by mouth daily. 60 tablet 0 01/31/2018 at Unknown time  . predniSONE (DELTASONE) 5 MG tablet Take 0.5 tablets (2.5 mg total) by mouth 2 (two) times daily with a meal. 90 tablet 1 01/31/2018 at Unknown time  . warfarin (COUMADIN) 2.5 MG tablet Take 2 - 3 tablets daily as directed by the anticoagulation clinic 75 tablet 3 01/30/2018 at Unknown time  . acetaminophen (TYLENOL) 500 MG tablet Take 500-1,000 mg by mouth daily as needed for mild pain, fever or headache.   unknown  . apixaban (ELIQUIS) 5 MG TABS tablet Take 1 tablet (5 mg total) by mouth 2 (two) times daily. (Patient not taking: Reported on 01/31/2018) 60 tablet 3 Not Taking at Unknown time  . clotrimazole-betamethasone (LOTRISONE) cream Apply 1 application topically daily as needed (for fungus).    Taking  . diphenhydrAMINE (BENADRYL) 25 MG tablet Take 25 mg by mouth every 6 (six) hours as needed for itching.   Not Taking at Unknown time  . docusate sodium (COLACE) 100 MG capsule Take 100 mg by mouth daily as needed for mild constipation.   unknown  . Leuprolide Acetate, 6 Month, (LUPRON DEPOT) 45 MG injection Inject 45 mg into the muscle every 6 (six) months.   Taking  . loperamide (IMODIUM) 2 MG capsule Take 2 mg by mouth as needed for diarrhea or loose stools.   unknown  . nystatin (MYCOSTATIN) powder Apply 1 g topically 4 (four) times daily as needed (fungus).    unknown  . phenylephrine-shark liver oil-mineral oil-petrolatum (PREPARATION H) 0.25-3-14-71.9 % rectal ointment Place 1 application rectally 2 (two) times daily as needed (for chaffing).   Not Taking at Unknown time  . sennosides-docusate sodium  (SENOKOT-S) 8.6-50 MG tablet Take 1 tablet by mouth daily as needed for constipation.    unknown  . VENTOLIN HFA 108 (90 Base) MCG/ACT inhaler Inhale 1-2 puffs into the lungs every 6 (six) hours as needed for wheezing or shortness of breath.    Taking   Scheduled:  . diltiazem  90 mg Oral Q12H  . insulin aspart  0-5 Units Subcutaneous QHS  . insulin aspart  0-9 Units Subcutaneous Q6H  . warfarin  6 mg Oral ONCE-1800  . Warfarin - Pharmacist Dosing Inpatient   Does not apply q1800   Infusions:  . [START ON 02/01/2018] cefTRIAXone (ROCEPHIN)  IV    . dextrose 5 % and 0.45 %  NaCl with KCl 40 mEq/L    . metronidazole     PRN: acetaminophen **OR** acetaminophen, ondansetron **OR** ondansetron (ZOFRAN) IV Anti-infectives (From admission, onward)   Start     Dose/Rate Route Frequency Ordered Stop   02/01/18 1500  cefTRIAXone (ROCEPHIN) 1 g in sodium chloride 0.9 % 100 mL IVPB     1 g 200 mL/hr over 30 Minutes Intravenous Every 24 hours 01/31/18 1543     01/31/18 1545  metroNIDAZOLE (FLAGYL) IVPB 500 mg     500 mg 100 mL/hr over 60 Minutes Intravenous Every 8 hours 01/31/18 1543     01/31/18 1415  cefTRIAXone (ROCEPHIN) 1 g in sodium chloride 0.9 % 100 mL IVPB     1 g 200 mL/hr over 30 Minutes Intravenous  Once 01/31/18 1403 01/31/18 1516      Assessment: 84 year old male taking warfarin 5mg  to 7.5mg  po daily at home, presents with inr 1.99 on home dose. INR goal 2-3, will continue warfarin. Potential moderate drug interaction with flagyl.    Goal of Therapy:  INR 2-3 Monitor platelets by anticoagulation protocol: Yes   Plan:  Warfarin 6mg  po x 1 dose tonight Will follow INR tomorrow  Watch for signs and symptoms of bleeding and lab work  Donna Christen Wilena Tyndall 01/31/2018,3:47 PM

## 2018-01-31 NOTE — ED Notes (Signed)
I&O attempt unsuccessful, RN assisting, MD notified

## 2018-01-31 NOTE — ED Notes (Signed)
Family at bedside. 

## 2018-01-31 NOTE — H&P (Signed)
History and Physical    [name] YQM:[id number] DOB: Jun 19, 1923 DOA: 01/31/2018  PCP: Rosita Fire, MD   Patient coming from: Home  Chief Complaint: AMS  HPI: [name] is a 84 y.o. male with medical history significant for atrial fibrillation on Coumadin, hypertension, diabetes, dementia, and non-Hodgkin's lymphoma who was brought to the emergency department today after he was found down earlier in the morning in his bedroom after a fall from his bed.  His wife was downstairs in the basement cleaning and she found him this way with some blood surrounding him.  She states that the patient has been more confused in the last several days and has had difficulty with interactions with family members and could not clearly follow the football game he was watching yesterday.  Patient is noted to have some dementia that has been worsening over the last month and continues to take his medications including Coumadin for atrial fibrillation.   ED Course: Vital signs are stable and laboratory data reveals WBC count of 11.2 and potassium of 3.1 along with glucose of 202.  He is noted to have multiple imaging studies with CT of the head and CTA of head and neck which are negative for any acute findings.  His chest x-ray does demonstrate some left lower lobe pneumonia and he has been started on Rocephin as a result.  Tele-neurology consultation does not recommend TPA at this time, but does recommend rule out of metabolic causes such as B28, folate, ammonia, and thyroid issues.  Consider EEG and neurology consultation.  Review of Systems: Cannot be obtained due to patient condition.  Past Medical History:  Diagnosis Date  . Acute bronchitis   . Arthritis   . Atrial fibrillation ([place])    Dr. Harl Bowie- [facility] follows saw 11'14  . Bladder stones    tx. with oral meds and antibiotics, now surgery planned  . Cataracts, both eyes    surgery planned May 2015  . Diabetes mellitus without complication  ([place])    Type II  . Dyspnea    with activity  . Dysrhythmia    Afib  . Family history of breast cancer   . Family history of colon cancer   . GERD (gastroesophageal reflux disease)   . 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 Margot Chimes, thus far without recurrence.   . Hypertension   . NHL (non-Hodgkin's lymphoma) ([place]) 2005   Diffuse large B-cell lymphoma, clinically stage IIIA, CD20 positive, status post cervical lymph node biopsy 08/03/2003 on the left. PET scan was also positive in the spleen and small bowel region, but bone marrow aspiration and biopsy were negative. So he essentially had stage IIIAs. He received R-CHOP x6 cycles with CR established by PET scan criteria on 11/23/2003 with no evidence for relapse th  . PAD (peripheral artery disease) ([place]) 08/27/2017  . Skin cancer    Basal cell- face,head, neck,  arms, legs, Back    Past Surgical History:  Procedure Laterality Date  . AMPUTATION Left 06/29/2017   Procedure: AMPUTATION LEFT GREAT TOE;  Surgeon: [name], MD;  Location: [facility];  Service: Vascular;  Laterality: Left;  . AMPUTATION Left 08/27/2017   Procedure: AMPUTATION Left SECOND TOE;  Surgeon: [name], MD;  Location: [facility]  OR;  Service: Vascular;  Laterality: Left;  . CATARACT EXTRACTION Bilateral 2015  . CHOLECYSTECTOMY    . COLON RESECTION     small bowel  . CYSTOSCOPY WITH LITHOLAPAXY N/A 08/10/2013   Procedure: CYSTOSCOPY WITH LITHOLAPAXY WITH [name];  Surgeon: [name], MD;  Location: [facility];  Service: Urology;  Laterality: N/A;  . ESOPHAGEAL DILATION N/A 02/27/2016   Procedure: ESOPHAGEAL DILATION;  Surgeon: [name], MD;  [facility];  Service: Endoscopy;  Laterality: N/A;  .  ESOPHAGOGASTRODUODENOSCOPY N/A 02/27/2016   Procedure: ESOPHAGOGASTRODUODENOSCOPY (EGD);  Surgeon: [name], MD;  [facility];  Service: Endoscopy;  Laterality: N/A;  12:00  . INCISION AND DRAINAGE PERIRECTAL ABSCESS    . LOWER EXTREMITY ANGIOGRAPHY N/A 06/25/2017   Procedure: LOWER EXTREMITY ANGIOGRAPHY;  Surgeon: [name], MD;  Location: [facility];  Service: Cardiovascular;  Laterality: N/A;  . LYMPH NODE DISSECTION Left    '05-neck  . PORT-A-CATH REMOVAL    . PORTACATH PLACEMENT     insertion and removal -last chemotherapy 10 yrs ago  . TRANSURETHRAL RESECTION OF PROSTATE N/A 08/10/2013   Procedure: TRANSURETHRAL RESECTION OF THE PROSTATE WITH GYRUS INSTRUMENTS;  Surgeon: [name], MD;  Location: [facility];  Service: Urology;  Laterality: N/A;     reports that he has never smoked. He has never used smokeless tobacco. He reports that he does not drink alcohol or use drugs.  Allergies  Allergen Reactions  . Augmentin [Amoxicillin-Pot Clavulanate] Rash    Redness of skin    Family History  Problem Relation Age of Onset  . Heart attack Mother        died in her 15s  . Other Father 84       pneumonia - died  . Breast cancer Sister        dx in her 34s-60s  . Colon cancer Brother        dx in his 2s  . Cancer Sister        TAH/BSO due to "male cancer"  . Breast cancer Daughter 10  . Breast cancer Daughter 73       DCIS - ATM VUS on [employer] 83 gene panel in 2018  . Breast cancer Daughter 35       DCIS - Negative on [employer] MyRisk 25 gene apnel in 2015  . Thyroid cancer Daughter 25       Medullary and Papillary  . Thyroid nodules Grandchild     Prior to Admission medications   Medication Sig Start Date End Date Taking? Authorizing Provider  abiraterone acetate (ZYTIGA) 250 MG tablet Take 4 tablets (1,000 mg total) by mouth daily. Take on an empty stomach 1 hour before or 2 hours after a meal 11/26/17  Yes [name], MD    amitriptyline (ELAVIL) 25 MG tablet Take 25 mg by mouth at bedtime.   Yes [provider]  CALCIUM CARB-CHOLECALCIFEROL PO Take 1 tablet by mouth daily.    Yes [provider]  CRANBERRY PO Take 4,200 mg by mouth daily.   Yes [provider]  cyanocobalamin (,VITAMIN B-12,) 1000 MCG/ML injection Inject 1,000 mcg every 30 (thirty) days into the skin. 02/25/17  Yes [provider]  denosumab (XGEVA) 120 MG/1.7ML SOLN injection Inject 120 mg into the skin every 30 (thirty) days.    Yes [provider]  diltiazem (CARDIZEM) 90 MG tablet TAKE 1 TABLET BY MOUTH TWICE DAILY -- **NEEDS APPOINTMENT FOR FURTHER  REFILLS** 12/03/17  Yes Branch, Alphonse Guild, MD  diltiazem (CARDIZEM) 90 MG tablet TAKE 1 TABLET BY MOUTH TWICE DAILY -- **NEEDS APPOINTMENT FOR FURTHER REFILLS** 12/09/17  Yes Branch, Alphonse Guild, MD  fluconazole (DIFLUCAN) 100 MG tablet Take 100 mg by mouth as needed.   Yes [provider]  furosemide (LASIX) 20 MG tablet Take 1 tablet (20 mg total) by mouth daily. 03/18/17 03/18/18 Yes Kathie Dike, MD  glipiZIDE (GLUCOTROL XL) 5 MG 24 hr tablet Take 5 mg by mouth daily with breakfast.  06/25/15  Yes [provider]  guaiFENesin (MUCINEX) 600 MG 12 hr tablet Take 600 mg by mouth as needed.   Yes [provider]  lisinopril (PRINIVIL,ZESTRIL) 20 MG tablet Take 1 tablet (20 mg total) daily by mouth. 02/25/17  Yes Sater, [name], MD  metFORMIN (GLUCOPHAGE) 850 MG tablet Take 500 mg by mouth 2 (two) times daily with a meal.  03/23/17  Yes [provider]  mirtazapine (REMERON) 15 MG tablet Take 15 mg by mouth at bedtime. 1/2 of 47m   Yes [provider]  omeprazole (PRILOSEC) 40 MG capsule Take 40 mg by mouth daily. 12/14/17  Yes [provider]  potassium chloride (K-DUR,KLOR-CON) 10 MEQ tablet Take 2 tablets (20 mEq total) by mouth daily. 06/21/17  Yes Higgs, VMathis Dad MD  predniSONE (DELTASONE) 5 MG tablet Take  0.5 tablets (2.5 mg total) by mouth 2 (two) times daily with a meal. 12/16/17  Yes KDerek Jack MD  warfarin (COUMADIN) 2.5 MG tablet Take 2 - 3 tablets daily as directed by the anticoagulation clinic 12/29/17  Yes Branch, JAlphonse Guild MD  acetaminophen (TYLENOL) 500 MG tablet Take 500-1,000 mg by mouth daily as needed for mild pain, fever or headache.    [provider]  apixaban (ELIQUIS) 5 MG TABS tablet Take 1 tablet (5 mg total) by mouth 2 (two) times daily. Patient not taking: Reported on 01/31/2018 12/29/17   BArnoldo Lenis MD  clotrimazole-betamethasone (LOTRISONE) cream Apply 1 application topically daily as needed (for fungus).  06/18/15   [provider]  diphenhydrAMINE (BENADRYL) 25 MG tablet Take 25 mg by mouth every 6 (six) hours as needed for itching.    [provider]  docusate sodium (COLACE) 100 MG capsule Take 100 mg by mouth daily as needed for mild constipation.    [provider]  Leuprolide Acetate, 6 Month, (LUPRON DEPOT) 45 MG injection Inject 45 mg into the muscle every 6 (six) months.    [provider]  loperamide (IMODIUM) 2 MG capsule Take 2 mg by mouth as needed for diarrhea or loose stools.    [provider]  nystatin (MYCOSTATIN) powder Apply 1 g topically 4 (four) times daily as needed (fungus).     [provider]  phenylephrine-shark liver oil-mineral oil-petrolatum (PREPARATION H) 0.25-3-14-71.9 % rectal ointment Place 1 application rectally 2 (two) times daily as needed (for chaffing).    [provider]  sennosides-docusate sodium (SENOKOT-S) 8.6-50 MG tablet Take 1 tablet by mouth daily as needed for constipation.     [provider]  VENTOLIN HFA 108 (90 Base) MCG/ACT inhaler Inhale 1-2 puffs into the lungs every 6 (six) hours as needed for wheezing or shortness of breath.  03/08/17   [provider]    Physical Exam: Vitals:   01/31/18 1101 01/31/18 1215  01/31/18 1230 01/31/18 1245  BP:  (!) 176/86 (!) 187/85 107/62  Pulse:  (!) 36 (!) 107 99  Resp:  20 20 20   Temp:      TempSrc:      SpO2:    98%  Weight: 70 kg     Height: 6' (1.829 m)       Constitutional: NAD, somnolent but arousable Vitals:   01/31/18 1101 01/31/18 1215 01/31/18 1230 01/31/18 1245  BP:  (!) 176/86 (!) 187/85 107/62  Pulse:  (!) 36 (!) 107 99  Resp:  20 20 20   Temp:      TempSrc:      SpO2:    98%  Weight: 70 kg     Height: 6' (1.829 m)      Eyes: lids and conjunctivae normal ENMT: Mucous membranes are moist.  Neck: normal, supple Respiratory: clear to auscultation bilaterally. Normal respiratory effort. No accessory muscle use.  Cardiovascular: Regular rate and rhythm, no murmurs. No extremity edema. Abdomen: no tenderness, no distention. Bowel sounds positive.  Musculoskeletal:  No joint deformity upper and lower extremities.   Skin: no rashes, lesions, ulcers.  Skin breakdown with abrasions noted throughout from fall. Psychiatric: Cannot be performed due to patient condition Neurology: Assessment cannot be completed due to patient's inability to cooperate  Labs on Admission: I have personally reviewed following labs and imaging studies  CBC: Recent Labs  Lab 01/31/18 1144  WBC 11.2*  NEUTROABS 7.4  HGB 14.4  HCT 40.9  MCV 88.1  PLT 371   Basic Metabolic Panel: Recent Labs  Lab 01/31/18 1144  NA 137  K 3.1*  CL 100  CO2 27  GLUCOSE 202*  BUN 15  CREATININE 0.76  CALCIUM 9.7   GFR: Estimated Creatinine Clearance: 58.3 mL/min (by C-G formula based on SCr of 0.76 mg/dL). Liver Function Tests: Recent Labs  Lab 01/31/18 1144  AST 20  ALT 15  ALKPHOS 72  BILITOT 1.2  PROT 7.2  ALBUMIN 4.1   No results for input(s): LIPASE, AMYLASE in the last 168 hours. No results for input(s): AMMONIA in the last 168 hours. Coagulation Profile: Recent Labs  Lab 01/31/18 1144  INR 1.99   Cardiac Enzymes: No results for input(s):  CKTOTAL, CKMB, CKMBINDEX, TROPONINI in the last 168 hours. BNP (last 3 results) No results for input(s): PROBNP in the last 8760 hours. HbA1C: No results for input(s): HGBA1C in the last 72 hours. CBG: Recent Labs  Lab 01/31/18 1108  GLUCAP 206*   Lipid Profile: No results for input(s): CHOL, HDL, LDLCALC, TRIG, CHOLHDL, LDLDIRECT in the last 72 hours. Thyroid Function Tests: No results for input(s): TSH, T4TOTAL, FREET4, T3FREE, THYROIDAB in the last 72 hours. Anemia Panel: No results for input(s): VITAMINB12, FOLATE, FERRITIN, TIBC, IRON, RETICCTPCT in the last 72 hours. Urine analysis:    Component Value Date/Time   COLORURINE YELLOW 01/31/2018 1110   APPEARANCEUR HAZY (A) 01/31/2018 1110   LABSPEC 1.027 01/31/2018 1110   PHURINE 5.0 01/31/2018 1110   GLUCOSEU NEGATIVE 01/31/2018 1110   HGBUR NEGATIVE 01/31/2018 [place] 01/31/2018 1110   KETONESUR NEGATIVE 01/31/2018 1110   PROTEINUR NEGATIVE 01/31/2018 1110   NITRITE NEGATIVE 01/31/2018 1110   LEUKOCYTESUR TRACE (A) 01/31/2018 1110    Radiological Exams on Admission: Ct Angio Head W Or Wo Contrast  Result Date: 01/31/2018 CLINICAL DATA:  Right-sided neglect. Speech disturbance. History of fall. No acute finding on previous head CT. EXAM: CT ANGIOGRAPHY HEAD AND NECK TECHNIQUE: Multidetector CT imaging of the head and neck was performed using the standard protocol during bolus administration of intravenous contrast.  Multiplanar CT image reconstructions and MIPs were obtained to evaluate the vascular anatomy. Carotid stenosis measurements (when applicable) are obtained utilizing NASCET criteria, using the distal internal carotid diameter as the denominator. CONTRAST:  47m ISOVUE-370 IOPAMIDOL (ISOVUE-370) INJECTION 76% COMPARISON:  CT earlier same day. FINDINGS: CTA NECK FINDINGS Aortic arch: Aortic atherosclerosis. No aneurysm or dissection. Branching pattern is normal with the congenital variation of the  left vertebral artery arising directly from the arch. No origin stenoses identified. Right carotid system: Common carotid artery widely patent to the bifurcation region. Atherosclerotic calcification at the carotid bifurcation. Minimal proximal ICA diameter 5.8 mm, therefore no stenosis. Cervical ICA widely patent beyond that. Left carotid system: Common carotid artery widely patent to the bifurcation. Calcified plaque at the carotid bifurcation and ICA bulb. Minimal ICA diameter 6 mm, therefore no stenosis. Cervical ICA widely patent beyond that. Vertebral arteries: Right vertebral artery is dominant with a normal origin. Left vertebral artery originates from the arch as noted above. Both vessels are widely patent at their origins and through the cervical region to the foramen magnum. Skeleton: Ordinary mid cervical spondylosis. Other neck: No mass or lymphadenopathy. Upper chest: Mild emphysema and scarring.  No active process. Review of the MIP images confirms the above findings CTA HEAD FINDINGS Anterior circulation: Both internal carotid arteries are patent through the skull base and siphon regions. There is ordinary peripheral calcification in the carotid siphon regions but no stenosis. The anterior and middle cerebral vessels are patent without proximal stenosis, aneurysm or vascular malformation. No missing branch vessels are identified. Posterior circulation: Left vertebral artery terminates in PICA. Right vertebral artery supplies the basilar. No basilar stenosis. Posterior circulation branch vessels are normal. Fetal origin right PCA. Venous sinuses: Patent and normal. Anatomic variants: None significant. Delayed phase: No abnormal enhancement. Review of the MIP images confirms the above findings IMPRESSION: No large or medium vessel occlusion identified. Nonstenotic atherosclerotic disease of the aortic arch, carotid bifurcations and carotid siphon regions. These results were called by telephone at the  time of interpretation on 01/31/2018 at 2:12 pm to Dr. NDavonna Belling, who verbally acknowledged these results. Electronically Signed   By: MNelson ChimesM.D.   On: 01/31/2018 14:16   Dg Chest 2 View  Result Date: 01/31/2018 CLINICAL DATA:  84year old male with a history of fall EXAM: CHEST - 2 VIEW COMPARISON:  12/30/2017, 11/25/2017 FINDINGS: Cardiomediastinal silhouette unchanged in size and contour. Reticular opacity of the left lower lobe partially obscuring left hemidiaphragm in the left heart border, on the frontal and lateral views. No pleural effusion. No pneumothorax. No significant interlobular septal thickening. No displaced fracture. IMPRESSION: Airspace opacity of the left lower lobe concerning for infection. Electronically Signed   By: JCorrie MckusickD.O.   On: 01/31/2018 13:46   Dg Forearm Right  Result Date: 01/31/2018 CLINICAL DATA:  Right forearm swelling after fall today. EXAM: RIGHT FOREARM - 2 VIEW COMPARISON:  None. FINDINGS: There is no evidence of fracture or other focal bone lesions. Vascular calcifications are noted. Otherwise soft tissues are unremarkable. IMPRESSION: No acute abnormality seen in the right forearm. Electronically Signed   By: JMarijo Conception M.D.   On: 01/31/2018 13:46   Dg Wrist Complete Right  Result Date: 01/31/2018 CLINICAL DATA:  Right wrist swelling after fall today. EXAM: RIGHT WRIST - COMPLETE 3+ VIEW COMPARISON:  None. FINDINGS: There is no evidence of fracture or dislocation. There is no evidence of arthropathy or other focal bone abnormality. Soft tissues  are unremarkable. IMPRESSION: Negative. Electronically Signed   By: Marijo Conception, M.D.   On: 01/31/2018 13:47   Ct Head Wo Contrast  Result Date: 01/31/2018 CLINICAL DATA:  84 year old male post fall.  Initial encounter. EXAM: CT HEAD WITHOUT CONTRAST CT CERVICAL SPINE WITHOUT CONTRAST TECHNIQUE: Multidetector CT imaging of the head and cervical spine was performed following the  standard protocol without intravenous contrast. Multiplanar CT image reconstructions of the cervical spine were also generated. COMPARISON:  09/29/2017 head CT.  No comparison cervical spine CT. FINDINGS: CT HEAD FINDINGS Brain: No intracranial hemorrhage or CT evidence of large acute infarct. Chronic microvascular changes. Global atrophy. No intracranial mass lesion noted on this unenhanced exam. Vascular: Vascular calcifications.  No hyperdense vessel. Skull: No skull fracture. Sinuses/Orbits: Post lens replacement. No acute orbital abnormality. Visualized paranasal sinuses clear. Other: Partial opacification right mastoid air cells without obstructing lesion of eustachian tube. This was noted previously. Soft tissue swelling right occipital region. CT CERVICAL SPINE FINDINGS Alignment: Minimal curvature. Skull base and vertebrae: No cervical spine fracture. Soft tissues and spinal canal: No abnormal prevertebral soft tissue swelling. Disc levels: Cervical spondylotic changes most notable C5-6 with mild spinal stenosis and slight cord flattening C4-5 through C6-7. Upper chest: No worrisome lung mass. Other: Carotid bifurcation calcifications. IMPRESSION: 1. No skull fracture or intracranial hemorrhage. 2. No cervical spine fracture or abnormal prevertebral soft tissue swelling. 3. Chronic changes as detailed above. Electronically Signed   By: Genia Del M.D.   On: 01/31/2018 11:56   Ct Angio Neck W And/or Wo Contrast  Result Date: 01/31/2018 CLINICAL DATA:  Right-sided neglect. Speech disturbance. History of fall. No acute finding on previous head CT. EXAM: CT ANGIOGRAPHY HEAD AND NECK TECHNIQUE: Multidetector CT imaging of the head and neck was performed using the standard protocol during bolus administration of intravenous contrast. Multiplanar CT image reconstructions and MIPs were obtained to evaluate the vascular anatomy. Carotid stenosis measurements (when applicable) are obtained utilizing NASCET  criteria, using the distal internal carotid diameter as the denominator. CONTRAST:  52m ISOVUE-370 IOPAMIDOL (ISOVUE-370) INJECTION 76% COMPARISON:  CT earlier same day. FINDINGS: CTA NECK FINDINGS Aortic arch: Aortic atherosclerosis. No aneurysm or dissection. 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 No origin stenoses identified. Right carotid system: Common carotid artery widely patent to the bifurcation region. Atherosclerotic calcification at the carotid bifurcation. Minimal proximal ICA diameter 5.8 mm, therefore no stenosis. Cervical ICA widely patent beyond that. Left carotid system: Common carotid artery widely patent to the bifurcation. Calcified plaque at the carotid bifurcation and ICA bulb. Minimal ICA diameter 6 mm, therefore no stenosis. Cervical ICA widely patent beyond that. Vertebral arteries: Right vertebral artery is dominant with a normal origin. Left vertebral artery originates from the arch as noted above. Both vessels are widely patent at their origins and through the cervical region to the foramen magnum. Skeleton: Ordinary mid cervical spondylosis. Other neck: No mass or lymphadenopathy. Upper chest: Mild emphysema and scarring.  No active process. Review of the MIP images confirms the above findings CTA HEAD FINDINGS Anterior circulation: Both internal carotid arteries are patent through the skull base and siphon regions. There is ordinary peripheral calcification in the carotid siphon regions but no stenosis. The anterior and middle cerebral vessels are patent without proximal stenosis, aneurysm or vascular malformation. No missing branch vessels are identified. Posterior circulation: Left vertebral artery terminates in PICA. Right vertebral artery supplies the basilar. No  basilar stenosis. Posterior circulation branch vessels are normal. Fetal origin right PCA. Venous sinuses: Patent and normal. Anatomic  variants: None significant. Delayed phase: No abnormal enhancement. Review of the MIP images confirms the above findings IMPRESSION: No large or medium vessel occlusion identified. Nonstenotic atherosclerotic disease of the aortic arch, carotid bifurcations and carotid siphon regions. These results were called by telephone at the time of interpretation on 01/31/2018 at 2:12 pm to Dr. Davonna Belling , who verbally acknowledged these results. Electronically Signed   By: Nelson Chimes M.D.   On: 01/31/2018 14:16   Ct Cervical Spine Wo Contrast  Result Date: 01/31/2018 CLINICAL DATA:  84 year old male post fall.  Initial encounter. EXAM: CT HEAD WITHOUT CONTRAST CT CERVICAL SPINE WITHOUT CONTRAST TECHNIQUE: Multidetector CT imaging of the head and cervical spine was performed following the standard protocol without intravenous contrast. Multiplanar CT image reconstructions of the cervical spine were also generated. COMPARISON:  09/29/2017 head CT.  No comparison cervical spine CT. FINDINGS: CT HEAD FINDINGS Brain: No intracranial hemorrhage or CT evidence of large acute infarct. Chronic microvascular changes. Global atrophy. No intracranial mass lesion noted on this unenhanced exam. Vascular: Vascular calcifications.  No hyperdense vessel. Skull: No skull fracture. Sinuses/Orbits: Post lens replacement. No acute orbital abnormality. Visualized paranasal sinuses clear. Other: Partial opacification right mastoid air cells without obstructing lesion of eustachian tube. This was noted previously. Soft tissue swelling right occipital region. CT CERVICAL SPINE FINDINGS Alignment: Minimal curvature. Skull base and vertebrae: No cervical spine fracture. Soft tissues and spinal canal: No abnormal prevertebral soft tissue swelling. Disc levels: Cervical spondylotic changes most notable C5-6 with mild spinal stenosis and slight cord flattening C4-5 through C6-7. Upper chest: No worrisome lung mass. Other: Carotid bifurcation  calcifications. IMPRESSION: 1. No skull fracture or intracranial hemorrhage. 2. No cervical spine fracture or abnormal prevertebral soft tissue swelling. 3. Chronic changes as detailed above. Electronically Signed   By: Genia Del M.D.   On: 01/31/2018 11:56    EKG: Independently reviewed.  Atrial fibrillation at 96 bpm with right bundle branch block.  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)    1. Acute encephalopathy with recent fall at home.  Could be related to metabolic factors and will check folate, B12, ammonia levels, and TSH along with EEG and consult with neurology for further recommendations regarding imaging and work-up.  It is difficult to ascertain if patient has simple advancement of dementia versus another acute event at this moment in time.  Hold off on brain MRI for now until further neurology recommendations and check 2D echocardiogram.  Avoid aspirin for now and maintain on Coumadin per pharmacy protocol.  Continue neurochecks with fall precautions.  N.p.o. until swallow evaluation.  PT/OT evaluation.  Check lipids and hemoglobin A1c.  Hold statin and check CK level due to fall. 2. Left lower lobe presumed aspiration pneumonia.  Will cover with Rocephin and add Flagyl.  Check urine Legionella and strep pneumonia as well. 3. Mild hypokalemia.  Replete IV and recheck in a.m. along with magnesium. 4. Hypertension.  Withhold home antihypertensives such as lisinopril, and Lasix, but maintain on diltiazem for heart rate control.  Allow permissive hypertension otherwise. 5. Type 2 diabetes.  With hold oral agents and maintain on sliding scale insulin  with every 6 hours Accu-Cheks for now. 6. Non-Hodgkin's lymphoma.   Continue treatment in the outpatient setting with Dr. Delton Coombes. 7. Prostate cancer.  Continue treatment in the outpatient setting per oncology recommendations.   DVT prophylaxis: Currently on Coumadin Code Status: Full code Family Communication: Spoke with wife and daughter at bedside Disposition Plan: Further neurology evaluation with recommendations per tele-neurology Consults called: Neurology for further assistance in management Admission status: Inpatient, telemetry   Emrys Mceachron Darleen Crocker DO [facility] Pager [phone]  If 7PM-7AM, please contact night-coverage [email or web address] Password [email or web address]  01/31/2018, 3:23 PM

## 2018-01-31 NOTE — Consult Note (Addendum)
   [facility] Tele[facility]urology Consult Services  Impression:  R/O Acute Ischemic Stroke worry about abrupt decline consider CJD,   Comments: STAT On Coumadin Theraputic No TPA outside of window for this, seems more metabolic anyway. CTA Head/Neck and EEG along with metabolic workup  Recommendations:  Diffusely weak, and AMS - metabolic r/o with Z56, Folate, Ammonia, Thyroid check EEG CTA head/neck if negative, no further comments.  Consider inpatient neurology consultation Discussed with ED MD Please call with questions  -----------------------------------------------------------------------------------------  CC  History of Present Illness   84 year old with confusion problems, and did not understand watching the game in the evening. He was on the floor in the bathroom, and has afib, he has a GI tumor, and he seems to have issues with neglecting the right side, he is nonverbal.   He was last normal was last night, or more during the day yesterday. Wife denies hallucinations, but yesterday he stopped understanding.    Diagnostic:  I visualized CT head is negative, for any bleed.    Exam:  Patient is in no apparent distress. Patient appears as stated age. No obvious acute respiratory or cardiac distress. Patient is well groomed and well-nourished. NIHSS score:  1A: Level of Consciousness - Requires repeated stimulation to arouse 2 1B: Ask Month and Age - 0 Questions Right 2 1C: 'Blink Eyes' & 'Squeeze Hands' - Performs 0 Tasks 2 2: Test Horizontal Extraocular Movements - Partial Gaze Palsy: Corrects with Oculocephalic Reflex 0 3: Test Visual Fields - No Visual Loss 0 4: Test Facial Palsy - Normal symmetry 0 5A: Test Left Arm Motor Drift - Some Effort Against Gravity 0 5B: Test Right Arm Motor Drift - Some Effort Against Gravity0 6A: Test Left Leg Motor Drift - Some Effort Against Gravity 2 6B: Test Right Leg Motor Drift - Some Effort Against Gravity 0 7: Test Limb  Ataxia - Ataxia in 2 Limbs 0 8: Test Sensation - Complete Loss: Cannot Sense Being Touched At All 0 9: Test Language/Aphasia- Severe Aphasia: Fragmentary Expression, Inference Needed, Cannot Identify Materials 0 10: Test Dysarthria - Mute/Anarthric 0 11: Test Extinction/Inattention - Extinction to bilateral simultaneous stimulation 0    Medical Decision Making:  - Extensive number of diagnosis or management options are considered above. - Extensive amount of complex data reviewed. - High risk of complication and/or morbidity or mortality are associated with differential diagnostic considerations above.  - There may be Uncertain outcome and increased probability of prolonged functional impairment or high probability of severe prolonged functional impairment associated with some of these differential diagnosis.  Medical Data Reviewed:  1.Data reviewed include clinical labs, radiology,Medical Tests; 2.Tests results discussed w/performing or interpreting physician; 3.Obtaining/reviewing old medical records;  4.Obtaining case history from another source;  5.Independent review of image, tracing or specimen.   Patient was informed the Neurology Consult would happen via TeleHealth consult by way of interactive audio and video telecommunications and consented to receiving care in this manner.

## 2018-02-01 ENCOUNTER — Inpatient Hospital Stay (HOSPITAL_COMMUNITY): Payer: Medicare Other

## 2018-02-01 ENCOUNTER — Inpatient Hospital Stay (HOSPITAL_COMMUNITY)
Admit: 2018-02-01 | Discharge: 2018-02-01 | Disposition: A | Discharge status: Home / Self Care | Payer: Medicare Other | Attending: Neurology | Admitting: Neurology

## 2018-02-01 DIAGNOSIS — E1169 Type 2 diabetes mellitus with other specified complication: Secondary | ICD-10-CM

## 2018-02-01 DIAGNOSIS — J690 Pneumonitis due to inhalation of food and vomit: Secondary | ICD-10-CM

## 2018-02-01 DIAGNOSIS — C7951 Secondary malignant neoplasm of bone: Secondary | ICD-10-CM

## 2018-02-01 DIAGNOSIS — C61 Malignant neoplasm of prostate: Secondary | ICD-10-CM

## 2018-02-01 DIAGNOSIS — R569 Unspecified convulsions: Secondary | ICD-10-CM | POA: Diagnosis not present

## 2018-02-01 DIAGNOSIS — I10 Essential (primary) hypertension: Secondary | ICD-10-CM

## 2018-02-01 DIAGNOSIS — I4891 Unspecified atrial fibrillation: Secondary | ICD-10-CM

## 2018-02-01 DIAGNOSIS — I340 Nonrheumatic mitral (valve) insufficiency: Secondary | ICD-10-CM

## 2018-02-01 LAB — ECHOCARDIOGRAM COMPLETE
Height: 72 in
Weight: 2469.15 oz

## 2018-02-01 LAB — GLUCOSE, CAPILLARY
Glucose-Capillary: 94 mg/dL (ref 70–99)
Glucose-Capillary: 181 mg/dL — ABNORMAL HIGH (ref 70–99)
Glucose-Capillary: 181 mg/dL — ABNORMAL HIGH (ref 70–99)
Glucose-Capillary: 173 mg/dL — ABNORMAL HIGH (ref 70–99)

## 2018-02-01 LAB — BASIC METABOLIC PANEL
Anion gap: 7 (ref 5–15)
BUN: 11 mg/dL (ref 8–23)
CO2: 25 mmol/L (ref 22–32)
Calcium: 8.6 mg/dL — ABNORMAL LOW (ref 8.9–10.3)
Chloride: 107 mmol/L (ref 98–111)
Creatinine, Ser: 0.66 mg/dL (ref 0.61–1.24)
GFR calc Af Amer: >60 mL/min (ref >60)
GFR calc non Af Amer: >60 mL/min (ref >60)
Glucose, Bld: 88 mg/dL (ref 70–99)
Potassium: 3.2 mmol/L — ABNORMAL LOW (ref 3.5–5.1)
Sodium: 139 mmol/L (ref 135–145)

## 2018-02-01 LAB — CBC
HCT: 37.7 % — ABNORMAL LOW (ref 39.0–52.0)
Hemoglobin: 12.7 g/dL — ABNORMAL LOW (ref 13.0–17.0)
MCH: 29.7 pg (ref 26.0–34.0)
MCHC: 33.7 g/dL (ref 30.0–36.0)
MCV: 88.1 fL (ref 80.0–100.0)
Platelets: 128 10*3/uL — ABNORMAL LOW (ref 150–400)
RBC: 4.28 MIL/uL (ref 4.22–5.81)
RDW: 13.1 % (ref 11.5–15.5)
WBC: 7.6 10*3/uL (ref 4.0–10.5)
nRBC: 0 % (ref 0.0–0.2)

## 2018-02-01 LAB — PROTIME-INR
INR: 2.13
Prothrombin Time: 23.7 seconds — ABNORMAL HIGH (ref 11.4–15.2)

## 2018-02-01 LAB — MAGNESIUM: Magnesium: 1.4 mg/dL — ABNORMAL LOW (ref 1.7–2.4)

## 2018-02-01 MED ORDER — WARFARIN SODIUM 7.5 MG PO TABS: 7.5000 mg | ORAL_TABLET | Freq: Once | ORAL | Status: DC

## 2018-02-01 MED ORDER — MAGNESIUM SULFATE 4 GM/100ML IV SOLN
4.0000 g | Freq: Once | INTRAVENOUS | Status: AC
  Administered 2018-02-01: 4 g via INTRAVENOUS
  Filled 2018-02-01: qty 100

## 2018-02-01 MED ORDER — KCL IN DEXTROSE-NACL 40-5-0.45 MEQ/L-%-% IV SOLN
INTRAVENOUS | Status: AC
  Administered 2018-02-02: 09:00:00 via INTRAVENOUS

## 2018-02-01 MED ORDER — POTASSIUM CHLORIDE 10 MEQ/100ML IV SOLN
10.0000 meq | INTRAVENOUS | Status: AC
  Administered 2018-02-01 (×4): 10 meq via INTRAVENOUS
  Filled 2018-02-01 (×5): qty 100

## 2018-02-01 MED ORDER — METOPROLOL TARTRATE 5 MG/5ML IV SOLN
5.0000 mg | Freq: Four times a day (QID) | INTRAVENOUS | Status: DC
  Administered 2018-02-01 – 2018-02-03 (×10): 5 mg via INTRAVENOUS
  Filled 2018-02-01 (×10): qty 5

## 2018-02-01 NOTE — Procedures (Signed)
[name] A. Merlene Laughter, MD     [email or web address]           HISTORY: The patient is a 84 year old presents with acute altered mental status and confusion.  The studies been done to evaluate for seizures as the etiology.  MEDICATIONS: Scheduled Meds: . diltiazem  90 mg Oral Q12H  . Influenza vac split quadrivalent PF  0.5 mL Intramuscular Tomorrow-1000  . insulin aspart  0-5 Units Subcutaneous QHS  . insulin aspart  0-9 Units Subcutaneous Q6H  . metoprolol tartrate  5 mg Intravenous Q6H  . thiamine injection  100 mg Intravenous Daily  . warfarin  7.5 mg Oral Once  . Warfarin - Pharmacist Dosing Inpatient   Does not apply q1800   Continuous Infusions: . cefTRIAXone (ROCEPHIN)  IV 1 g (02/01/18 1519)  . dextrose 5 % and 0.45 % NaCl with KCl 40 mEq/L    . magnesium sulfate 1 - 4 g bolus IVPB    . metronidazole 500 mg (02/01/18 1319)  . potassium chloride 10 mEq (02/01/18 1742)   PRN Meds:.acetaminophen **OR** acetaminophen, ondansetron **OR** ondansetron (ZOFRAN) IV  Prior to Admission medications   Medication Sig Start Date End Date Taking? Authorizing Provider  abiraterone acetate (ZYTIGA) 250 MG tablet Take 4 tablets (1,000 mg total) by mouth daily. Take on an empty stomach 1 hour before or 2 hours after a meal 11/26/17  Yes [name] Jack, MD  amitriptyline (ELAVIL) 25 MG tablet Take 25 mg by mouth at bedtime.   Yes [provider]  CALCIUM CARB-CHOLECALCIFEROL PO Take 1 tablet by mouth daily.    Yes [provider]  CRANBERRY PO Take 4,200 mg by mouth daily.   Yes [provider]  cyanocobalamin (,VITAMIN B-12,) 1000 MCG/ML injection Inject 1,000 mcg every 30 (thirty) days into the skin. 02/25/17  Yes [provider]  denosumab (XGEVA) 120 MG/1.7ML SOLN injection Inject 120 mg into the skin every 30 (thirty) days.    Yes [provider]  diltiazem (CARDIZEM) 90 MG tablet TAKE 1 TABLET BY MOUTH TWICE DAILY --  **NEEDS APPOINTMENT FOR FURTHER REFILLS** 12/03/17  Yes Foster, [name] Guild, MD  diltiazem (CARDIZEM) 90 MG tablet TAKE 1 TABLET BY MOUTH TWICE DAILY -- **NEEDS APPOINTMENT FOR FURTHER REFILLS** 12/09/17  Yes Foster, [name] Guild, MD  fluconazole (DIFLUCAN) 100 MG tablet Take 100 mg by mouth as needed.   Yes [provider]  furosemide (LASIX) 20 MG tablet Take 1 tablet (20 mg total) by mouth daily. 03/18/17 03/18/18 Yes [name] Dike, MD  glipiZIDE (GLUCOTROL XL) 5 MG 24 hr tablet Take 5 mg by mouth daily with breakfast.  06/25/15  Yes [provider]  guaiFENesin (MUCINEX) 600 MG 12 hr tablet Take 600 mg by mouth as needed.   Yes [provider]  lisinopril (PRINIVIL,ZESTRIL) 20 MG tablet Take 1 tablet (20 mg total) daily by mouth. 02/25/17  Yes Foster, [name] Means, MD  metFORMIN (GLUCOPHAGE) 850 MG tablet Take 500 mg by mouth 2 (two) times daily with a meal.  03/23/17  Yes [provider]  mirtazapine (REMERON) 15 MG tablet Take 15 mg by mouth at bedtime. 1/2 of 15mg    Yes [provider]  omeprazole (PRILOSEC) 40 MG capsule Take 40 mg by mouth daily. 12/14/17  Yes [provider]  potassium chloride (K-DUR,KLOR-CON) 10 MEQ tablet Take 2 tablets (20 mEq total) by mouth daily. 06/21/17  Yes Higgs, Mathis Dad, MD  predniSONE (DELTASONE) 5 MG tablet Take 0.5 tablets (2.5  mg total) by mouth 2 (two) times daily with a meal. 12/16/17  Yes [name] Jack, MD  warfarin (COUMADIN) 2.5 MG tablet Take 2 - 3 tablets daily as directed by the anticoagulation clinic 12/29/17  Yes Foster, [name] Guild, MD  acetaminophen (TYLENOL) 500 MG tablet Take 500-1,000 mg by mouth daily as needed for mild pain, fever or headache.    [provider]  apixaban (ELIQUIS) 5 MG TABS tablet Take 1 tablet (5 mg total) by mouth 2 (two) times daily. Patient not taking: Reported on 01/31/2018 12/29/17   [name] Lenis, MD  clotrimazole-betamethasone (LOTRISONE) cream Apply 1  application topically daily as needed (for fungus).  06/18/15   [provider]  diphenhydrAMINE (BENADRYL) 25 MG tablet Take 25 mg by mouth every 6 (six) hours as needed for itching.    [provider]  docusate sodium (COLACE) 100 MG capsule Take 100 mg by mouth daily as needed for mild constipation.    [provider]  Leuprolide Acetate, 6 Month, (LUPRON DEPOT) 45 MG injection Inject 45 mg into the muscle every 6 (six) months.    [provider]  loperamide (IMODIUM) 2 MG capsule Take 2 mg by mouth as needed for diarrhea or loose stools.    [provider]  nystatin (MYCOSTATIN) powder Apply 1 g topically 4 (four) times daily as needed (fungus).     [provider]  phenylephrine-shark liver oil-mineral oil-petrolatum (PREPARATION H) 0.25-3-14-71.9 % rectal ointment Place 1 application rectally 2 (two) times daily as needed (for chaffing).    [provider]  sennosides-docusate sodium (SENOKOT-S) 8.6-50 MG tablet Take 1 tablet by mouth daily as needed for constipation.     [provider]  VENTOLIN HFA 108 (90 Base) MCG/ACT inhaler Inhale 1-2 puffs into the lungs every 6 (six) hours as needed for wheezing or shortness of breath.  03/08/17   [provider]      ANALYSIS: A 16 channel recording using standard 10 20 measurements is conducted for 22 minutes.  The background activity is replete with generalized 3 to 4 Hz delta slowing.  However, there is intermittent higher activity of 6.5-7 Hz.  The recording is significantly degraded with myogenic artifact.  Photic stimulation and hyperventilation are not conducted.  There is no focal or lateralized slowing.  There is no epileptiform activity is observed.   IMPRESSION: 1.  This recording of the awake and drowsy state shows moderate global slowing indicating a mild global encephalopathy.  However, no epileptiform activities are observed.      [name],  M.D.  Diplomate, Tax adviser of Psychiatry and Neurology ( Neurology).

## 2018-02-01 NOTE — Evaluation (Signed)
Physical Therapy Evaluation Patient Details Name: [name] MRN: [id number] DOB: 1924-04-13 Today's Date: 02/01/2018   History of Present Illness  [name] is a 84 y.o. male with medical history significant for atrial fibrillation on Coumadin, hypertension, diabetes, dementia, and non-Hodgkin's lymphoma who was brought to the emergency department today after he was found down earlier in the morning in his bedroom after a fall from his bed.  His wife was downstairs in the basement cleaning and she found him this way with some blood surrounding him.  She states that the patient has been more confused in the last several days and has had difficulty with interactions with family members and could not clearly follow the football game he was watching yesterday.  Patient is noted to have some dementia that has been worsening over the last month and continues to take his medications including Coumadin for atrial fibrillation.    Clinical Impression  Patient functioning near baseline for functional mobility and gait, mostly limited due to fatigue/generalized weakness, had difficulty sitting up at bedside requiring verbal/tactile cueing, ambulated in hallway without loss of balance, but requires constant verbal/tactile cueing for safety and balance decreases as patient becomes fatgiued.  Patient tolerated sitting up in chair after therapy with family members present in room.  Patient will benefit from continued physical therapy in hospital and recommended venue below to increase strength, balance, endurance for safe ADLs and gait.    Follow Up Recommendations Home health PT;Supervision for mobility/OOB;Supervision/Assistance - 24 hour    Equipment Recommendations  None recommended by PT    Recommendations for Other Services       Precautions / Restrictions Precautions Precautions: Fall Restrictions Weight Bearing Restrictions: No      Mobility  Bed Mobility Overal bed mobility: Needs  Assistance Bed Mobility: Supine to Sit;Sit to Supine     Supine to sit: Min assist Sit to supine: Min guard   General bed mobility comments: slow labored movement with assistance for sitting up at bedside  Transfers Overall transfer level: Needs assistance Equipment used: Rolling walker (2 wheeled) Transfers: Sit to/from [facility] Sit to Stand: Min guard Stand pivot transfers: Min guard          Ambulation/Gait Ambulation/Gait assistance: Min guard Gait Distance (Feet): 50 Feet Assistive device: Rolling walker (2 wheeled) Gait Pattern/deviations: Decreased step length - right;Decreased step length - left;Decreased stride length Gait velocity: decreased   General Gait Details: slow labored cadence without loss of balance, frequent verbal/tactile cueing to following directions, possibly due to very Insurance risk surveyor    Modified Rankin (Stroke Patients Only)       Balance Overall balance assessment: Needs assistance Sitting-balance support: Feet supported;No upper extremity supported Sitting balance-Leahy Scale: Good     Standing balance support: Bilateral upper extremity supported;During functional activity Standing balance-Leahy Scale: Fair                               Pertinent Vitals/Pain Pain Assessment: No/denies pain    Home Living Family/patient expects to be discharged to:: Private residence Living Arrangements: Spouse/significant other Available Help at Discharge: Family;Available 24 hours/day Type of Home: House Home Access: Stairs to enter Entrance Stairs-Rails: Right Entrance Stairs-Number of Steps: 3 in front and 4 steps in back, 1 siderail on right side going up in front Home Layout: Able to live on  main level with bedroom/bathroom;One level;Laundry or work area in [place]: Environmental consultant - 2 wheels;Bedside commode;Shower seat;Cane - single point;Wheelchair - manual       Prior Function Level of Independence: Needs assistance   Gait / Transfers Assistance Needed: Household and short distanced community ambulator with RW  ADL's / Homemaking Assistance Needed: assisted by spouse        Hand Dominance   Dominant Hand: Right    Extremity/Trunk Assessment   Upper Extremity Assessment Upper Extremity Assessment: Defer to OT evaluation    Lower Extremity Assessment Lower Extremity Assessment: Generalized weakness    Cervical / Trunk Assessment Cervical / Trunk Assessment: Normal  Communication   Communication: HOH  Cognition Arousal/Alertness: Awake/alert Behavior During Therapy: WFL for tasks assessed/performed Overall Cognitive Status: Within Functional Limits for tasks assessed                                        General Comments      Exercises     Assessment/Plan    PT Assessment Patient needs continued PT services  PT Problem List Decreased strength;Decreased activity tolerance;Decreased balance;Decreased mobility       PT Treatment Interventions Gait training;Stair training;Functional mobility training;Therapeutic activities;Therapeutic exercise;Patient/family education    PT Goals (Current goals can be found in the Care Plan section)  Acute Rehab PT Goals Patient Stated Goal: To return home and remain as independent as possible. PT Goal Formulation: With patient/family Time For Goal Achievement: 02/08/18 Potential to Achieve Goals: Good    Frequency Min 3X/week   Barriers to discharge        Co-evaluation               AM-PAC PT "6 Clicks" Daily Activity  Outcome Measure Difficulty turning over in bed (including adjusting bedclothes, sheets and blankets)?: None Difficulty moving from lying on back to sitting on the side of the bed? : A Little Difficulty sitting down on and standing up from a chair with arms (e.g., wheelchair, bedside commode, etc,.)?: A Little Help needed moving to and from  a bed to chair (including a wheelchair)?: A Little Help needed walking in hospital room?: A Little Help needed climbing 3-5 steps with a railing? : A Little 6 Click Score: 19    End of Session   Activity Tolerance: Patient tolerated treatment well;Patient limited by fatigue Patient left: in chair;with call bell/phone within reach;with family/visitor present   PT Visit Diagnosis: Unsteadiness on feet (R26.81);Other abnormalities of gait and mobility (R26.89);Muscle weakness (generalized) (M62.81)    Time: 2094-7096 PT Time Calculation (min) (ACUTE ONLY): 32 min   Charges:   PT Evaluation $PT Eval Moderate Complexity: 1 Mod PT Treatments $Therapeutic Activity: 23-37 mins        1:56 PM, 02/01/18 Lonell Grandchild, MPT Physical Therapist with [facility] [phone] office [phone] mobile phone

## 2018-02-01 NOTE — Progress Notes (Addendum)
ANTICOAGULATION CONSULT NOTE - Initial Consult  Pharmacy Consult for warfarin Indication: atrial fibrillation  Allergies  Allergen Reactions  . Augmentin [Amoxicillin-Pot Clavulanate] Rash    Redness of skin    Patient Measurements: Height: 6' (182.9 cm) Weight: 154 lb 5.2 oz (70 kg) IBW/kg (Calculated) : 77.6  Vital Signs: Temp: 98.4 F (36.9 C) (10/15 0443) Temp Source: Oral (10/15 0443) BP: 138/79 (10/15 0443) Pulse Rate: 94 (10/15 0443)  Labs: Recent Labs    01/31/18 1144 01/31/18 1217 02/01/18 0439  HGB 14.4  --  12.7*  HCT 40.9  --  37.7*  PLT 176  --  128*  LABPROT 22.5*  --  23.7*  INR 1.99  --  2.13  CREATININE 0.76  --  0.66  CKTOTAL  --  39*  --     Estimated Creatinine Clearance: 58.3 mL/min (by C-G formula based on SCr of 0.66 mg/dL).   Medical History: Past Medical History:  Diagnosis Date  . Acute bronchitis   . Arthritis   . Atrial fibrillation ([place])    Dr. Harl Bowie- [facility] follows saw 11'14  . Bladder stones    tx. with oral meds and antibiotics, now surgery planned  . Cataracts, both eyes    surgery planned May 2015  . Diabetes mellitus without complication ([place])    Type II  . Dyspnea    with activity  . Dysrhythmia    Afib  . Family history of breast cancer   . Family history of colon cancer   . GERD (gastroesophageal reflux disease)   . 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 Margot Chimes, thus far without recurrence.   . Hypertension   . NHL (non-Hodgkin's lymphoma) ([place]) 2005   Diffuse large B-cell lymphoma, clinically stage IIIA, CD20 positive, status post cervical lymph node biopsy 08/03/2003 on the left. PET scan was also positive in the spleen and small bowel region, but bone marrow aspiration and  biopsy were negative. So he essentially had stage IIIAs. He received R-CHOP x6 cycles with CR established by PET scan criteria on 11/23/2003 with no evidence for relapse th  . PAD (peripheral artery disease) ([place]) 08/27/2017  . Skin cancer    Basal cell- face,head, neck,  arms, legs, Back    Medications:  Medications Prior to Admission  Medication Sig Dispense Refill Last Dose  . abiraterone acetate (ZYTIGA) 250 MG tablet Take 4 tablets (1,000 mg total) by mouth daily. Take on an empty stomach 1 hour before or 2 hours after a meal 120 tablet 6 01/31/2018 at Unknown time  . amitriptyline (ELAVIL) 25 MG tablet Take 25 mg by mouth at bedtime.   01/30/2018 at Unknown time  . CALCIUM CARB-CHOLECALCIFEROL PO Take 1 tablet by mouth daily.    01/31/2018 at Unknown time  . CRANBERRY PO Take 4,200 mg by mouth daily.   01/31/2018 at Unknown time  . cyanocobalamin (,VITAMIN B-12,) 1000 MCG/ML injection Inject 1,000 mcg every 30 (thirty) days into the skin.   Past Month at Unknown time  . denosumab (XGEVA) 120 MG/1.7ML SOLN injection Inject 120 mg into the skin every 30 (thirty) days.    Past Month at Unknown time  . diltiazem (CARDIZEM) 90 MG tablet TAKE 1 TABLET BY MOUTH TWICE DAILY -- **NEEDS APPOINTMENT FOR FURTHER REFILLS**  60 tablet 0 01/31/2018 at Unknown time  . diltiazem (CARDIZEM) 90 MG tablet TAKE 1 TABLET BY MOUTH TWICE DAILY -- **NEEDS APPOINTMENT FOR FURTHER REFILLS** 60 tablet 0 01/30/2018 at Unknown time  . fluconazole (DIFLUCAN) 100 MG tablet Take 100 mg by mouth as needed.   unknown  . furosemide (LASIX) 20 MG tablet Take 1 tablet (20 mg total) by mouth daily. 30 tablet 11 01/31/2018 at Unknown time  . glipiZIDE (GLUCOTROL XL) 5 MG 24 hr tablet Take 5 mg by mouth daily with breakfast.    01/31/2018 at Unknown time  . guaiFENesin (MUCINEX) 600 MG 12 hr tablet Take 600 mg by mouth as needed.   unknown  . lisinopril (PRINIVIL,ZESTRIL) 20 MG tablet Take 1 tablet (20 mg total) daily by mouth. 30  tablet 11 01/31/2018 at Unknown time  . metFORMIN (GLUCOPHAGE) 850 MG tablet Take 500 mg by mouth 2 (two) times daily with a meal.    01/31/2018 at Unknown time  . mirtazapine (REMERON) 15 MG tablet Take 15 mg by mouth at bedtime. 1/2 of 15mg    01/30/2018 at Unknown time  . omeprazole (PRILOSEC) 40 MG capsule Take 40 mg by mouth daily.  3 01/31/2018 at Unknown time  . potassium chloride (K-DUR,KLOR-CON) 10 MEQ tablet Take 2 tablets (20 mEq total) by mouth daily. 60 tablet 0 01/31/2018 at Unknown time  . predniSONE (DELTASONE) 5 MG tablet Take 0.5 tablets (2.5 mg total) by mouth 2 (two) times daily with a meal. 90 tablet 1 01/31/2018 at Unknown time  . warfarin (COUMADIN) 2.5 MG tablet Take 2 - 3 tablets daily as directed by the anticoagulation clinic 75 tablet 3 01/30/2018 at Unknown time  . acetaminophen (TYLENOL) 500 MG tablet Take 500-1,000 mg by mouth daily as needed for mild pain, fever or headache.   unknown  . apixaban (ELIQUIS) 5 MG TABS tablet Take 1 tablet (5 mg total) by mouth 2 (two) times daily. (Patient not taking: Reported on 01/31/2018) 60 tablet 3 Not Taking at Unknown time  . clotrimazole-betamethasone (LOTRISONE) cream Apply 1 application topically daily as needed (for fungus).    Taking  . diphenhydrAMINE (BENADRYL) 25 MG tablet Take 25 mg by mouth every 6 (six) hours as needed for itching.   Not Taking at Unknown time  . docusate sodium (COLACE) 100 MG capsule Take 100 mg by mouth daily as needed for mild constipation.   unknown  . Leuprolide Acetate, 6 Month, (LUPRON DEPOT) 45 MG injection Inject 45 mg into the muscle every 6 (six) months.   Taking  . loperamide (IMODIUM) 2 MG capsule Take 2 mg by mouth as needed for diarrhea or loose stools.   unknown  . nystatin (MYCOSTATIN) powder Apply 1 g topically 4 (four) times daily as needed (fungus).    unknown  . phenylephrine-shark liver oil-mineral oil-petrolatum (PREPARATION H) 0.25-3-14-71.9 % rectal ointment Place 1 application  rectally 2 (two) times daily as needed (for chaffing).   Not Taking at Unknown time  . sennosides-docusate sodium (SENOKOT-S) 8.6-50 MG tablet Take 1 tablet by mouth daily as needed for constipation.    unknown  . VENTOLIN HFA 108 (90 Base) MCG/ACT inhaler Inhale 1-2 puffs into the lungs every 6 (six) hours as needed for wheezing or shortness of breath.    Taking   Scheduled:  . diltiazem  90 mg Oral Q12H  . Influenza vac split quadrivalent PF  0.5 mL Intramuscular Tomorrow-1000  . insulin aspart  0-5 Units Subcutaneous QHS  . insulin  aspart  0-9 Units Subcutaneous Q6H  . thiamine injection  100 mg Intravenous Daily  . warfarin  6 mg Oral ONCE-1800  . Warfarin - Pharmacist Dosing Inpatient   Does not apply q1800   Infusions:  . cefTRIAXone (ROCEPHIN)  IV    . metronidazole 500 mg (02/01/18 0336)   PRN: acetaminophen **OR** acetaminophen, ondansetron **OR** ondansetron (ZOFRAN) IV Anti-infectives (From admission, onward)   Start     Dose/Rate Route Frequency Ordered Stop   02/01/18 1500  cefTRIAXone (ROCEPHIN) 1 g in sodium chloride 0.9 % 100 mL IVPB     1 g 200 mL/hr over 30 Minutes Intravenous Every 24 hours 01/31/18 1543     01/31/18 1545  metroNIDAZOLE (FLAGYL) IVPB 500 mg     500 mg 100 mL/hr over 60 Minutes Intravenous Every 8 hours 01/31/18 1543     01/31/18 1415  cefTRIAXone (ROCEPHIN) 1 g in sodium chloride 0.9 % 100 mL IVPB     1 g 200 mL/hr over 30 Minutes Intravenous  Once 01/31/18 1403 01/31/18 1516      Assessment: 84 year old male taking warfarin 5mg  to 7.5mg  po daily at home, INR therapeutic at 2.13. INR goal 2-3, will continue warfarin. Patient's home dose of warfarin is 7.5 mg every Tue,Fri and 5 mg ROW.   Goal of Therapy:  INR 2-3 Monitor platelets by anticoagulation protocol: Yes   Plan:  Warfarin 7.5 mg po x 1 dose tonight Will follow INR tomorrow  Watch for signs and symptoms of bleeding and lab work  [employer] 02/01/2018,8:58 AM

## 2018-02-01 NOTE — Clinical Social Work Note (Signed)
Patient recommended for HHPT.   LCSW signing off.

## 2018-02-01 NOTE — Progress Notes (Signed)
[place] A. Merlene Laughter, MD     [email or web address]          [name] is an 84 y.o. male.   Assessment/Plan: 1.  Acute impairment in mentation/encephalopathy of unclear etiology: Etiology includes postconcussive syndrome, seizures, ischemic infarct not seen on imaging and medication effect.    EEG and MRI have been  Non-diagnostic Minimize and reduce his psychotropic medications including Remeron, amitriptyline and Benadryl.  2.  Functional deterioration of cognition and ambulation in the last few months status post hospitalizations for various reasons: Etiology likely multifactorial including aging.  3.  Vitamin B12 deficiency per testing done a year ago: This will be repeated.  4.  Likely malnourished: Thiamine replacement has been initiated   The patient is more alert today.   GENERAL: This a frail male who appears to be in some discomfort but no acute distress.  HEENT: Neck is supple no clear head trauma seen.  ABDOMEN: soft  EXTREMITIES: No edema; there is significant areas of bruising involving the upper extremities.  There is marked through the changes of the knees bilaterally especially the left.  BACK: This is unrevealing for age.  SKIN: Normal by inspection.    MENTAL STATUS:  He is laying sitting up in the bed.  He is definitely more alert and follows commands briskly.  He is disoriented however.  Speech is often nonsensical.  CRANIAL NERVES: Pupils are equal, round and reactive to light; extra ocular movements are full, there is no significant nystagmus; visual fields are full; upper and lower facial muscles are normal in strength and symmetric, there is no flattening of the nasolabial folds; tongue is midline  MOTOR: He moves both sides well.  Bulk and tone appears to be normal.  COORDINATION: Left finger to nose is normal, right finger to nose is normal, No rest tremor; no intention tremor; no postural tremor; no  bradykinesia.  REFLEXES: Deep tendon reflexes are symmetrical and normal but diminished in the legs.  SENSATION: Normal to pain.  GAIT: Normal.      Objective: Vital signs in last 24 hours: Temp:  [97.7 F (36.5 C)-98.4 F (36.9 C)] 98.3 F (36.8 C) (10/15 1500) Pulse Rate:  [85-94] 88 (10/15 1500) Resp:  [18-20] 20 (10/15 1500) BP: (137-156)/(75-95) 137/95 (10/15 1500) SpO2:  [97 %-99 %] 97 % (10/15 1500)  Intake/Output from previous day: 10/14 0701 - 10/15 0700 In: 794.8 [I.V.:594.8; IV Piggyback:200] Out: -  Intake/Output this shift: Total I/O In: 840 [P.O.:240; IV Piggyback:600] Out: -  Nutritional status:  Diet Order            Diet NPO time specified Except for: Sips with Meds  Diet effective now               Lab Results: Results for orders placed or performed during the hospital encounter of 01/31/18 (from the past 48 hour(s))  CBG monitoring, ED     Status: Abnormal   Collection Time: 01/31/18 11:08 AM  Result Value Ref Range   Glucose-Capillary 206 (H) 70 - 99 mg/dL  Urinalysis, Routine w reflex microscopic     Status: Abnormal   Collection Time: 01/31/18 11:10 AM  Result Value Ref Range   Color, Urine YELLOW YELLOW   APPearance HAZY (A) CLEAR   Specific Gravity, Urine 1.027 1.005 - 1.030   pH 5.0 5.0 - 8.0   Glucose, UA NEGATIVE NEGATIVE mg/dL   Hgb urine dipstick NEGATIVE NEGATIVE   Bilirubin Urine NEGATIVE NEGATIVE  Ketones, ur NEGATIVE NEGATIVE mg/dL   Protein, ur NEGATIVE NEGATIVE mg/dL   Nitrite NEGATIVE NEGATIVE   Leukocytes, UA TRACE (A) NEGATIVE   RBC / HPF 0-5 0 - 5 RBC/hpf   WBC, UA 0-5 0 - 5 WBC/hpf   Bacteria, UA RARE (A) NONE SEEN   Mucus PRESENT     Comment: Performed at [facility], [address], [place], [place] [address]  Comprehensive metabolic panel     Status: Abnormal   Collection Time: 01/31/18 11:44 AM  Result Value Ref Range   Sodium 137 135 - 145 mmol/L   Potassium 3.1 (L) 3.5 - 5.1 mmol/L   Chloride 100  98 - 111 mmol/L   CO2 27 22 - 32 mmol/L   Glucose, Bld 202 (H) 70 - 99 mg/dL   BUN 15 8 - 23 mg/dL   Creatinine, Ser 0.76 0.61 - 1.24 mg/dL   Calcium 9.7 8.9 - 10.3 mg/dL   Total Protein 7.2 6.5 - 8.1 g/dL   Albumin 4.1 3.5 - 5.0 g/dL   AST 20 15 - 41 U/L   ALT 15 0 - 44 U/L   Alkaline Phosphatase 72 38 - 126 U/L   Total Bilirubin 1.2 0.3 - 1.2 mg/dL   GFR calc non Af Amer >60 >60 mL/min   GFR calc Af Amer >60 >60 mL/min    Comment: (NOTE) The eGFR has been calculated using the CKD EPI equation. This calculation has not been validated in all clinical situations. eGFR's persistently <60 mL/min signify possible Chronic Kidney Disease.    Anion gap 10 5 - 15    Comment: Performed at [facility], [address] [place], [place] [address]  Protime-INR     Status: Abnormal   Collection Time: 01/31/18 11:44 AM  Result Value Ref Range   Prothrombin Time 22.5 (H) 11.4 - 15.2 seconds   INR 1.99     Comment: Performed at [facility], [address], [place], [place] [address]  CBC with Differential     Status: Abnormal   Collection Time: 01/31/18 11:44 AM  Result Value Ref Range   WBC 11.2 (H) 4.0 - 10.5 K/uL   RBC 4.64 4.22 - 5.81 MIL/uL   Hemoglobin 14.4 13.0 - 17.0 g/dL   HCT 40.9 39.0 - 52.0 %   MCV 88.1 80.0 - 100.0 fL   MCH 31.0 26.0 - 34.0 pg   MCHC 35.2 30.0 - 36.0 g/dL   RDW 12.8 11.5 - 15.5 %   Platelets 176 150 - 400 K/uL   nRBC 0.0 0.0 - 0.2 %   Neutrophils Relative % 66 %   Neutro Abs 7.4 1.7 - 7.7 K/uL   Lymphocytes Relative 15 %   Lymphs Abs 1.7 0.7 - 4.0 K/uL   Monocytes Relative 16 %   Monocytes Absolute 1.8 (H) 0.1 - 1.0 K/uL   Eosinophils Relative 0 %   Eosinophils Absolute 0.0 0.0 - 0.5 K/uL   Basophils Relative 0 %   Basophils Absolute 0.0 0.0 - 0.1 K/uL   Immature Granulocytes 3 %   Abs Immature Granulocytes 0.28 (H) 0.00 - 0.07 K/uL    Comment: Performed at [facility], [address] [place], [place] [address]  TSH     Status: None   Collection  Time: 01/31/18 12:17 PM  Result Value Ref Range   TSH 4.363 0.350 - 4.500 uIU/mL    Comment: Performed by a 3rd Generation assay with a functional sensitivity of <=0.01 uIU/mL. Performed at  [facility], [address] [place], [place] [address]   Hemoglobin A1c     Status: Abnormal   Collection Time: 01/31/18 12:17 PM  Result Value Ref Range   Hgb A1c MFr Bld 6.3 (H) 4.8 - 5.6 %    Comment: (NOTE) Pre diabetes:          5.7%-6.4% Diabetes:              >6.4% Glycemic control for   <7.0% adults with diabetes    Mean Plasma Glucose 134.11 mg/dL    Comment: Performed at [facility] [address], [place], [place] [address]  Lipid panel     Status: None   Collection Time: 01/31/18 12:17 PM  Result Value Ref Range   Cholesterol 147 0 - 200 mg/dL   Triglycerides 109 <150 mg/dL   HDL 50 >40 mg/dL   Total CHOL/HDL Ratio 2.9 RATIO   VLDL 22 0 - 40 mg/dL   LDL Cholesterol 75 0 - 99 mg/dL    Comment:        Total Cholesterol/HDL:CHD Risk Coronary Heart Disease Risk Table                     Men   Women  1/2 Average Risk   3.4   3.3  Average Risk       5.0   4.4  2 X Average Risk   9.6   7.1  3 X Average Risk  23.4   11.0        Use the calculated Patient Ratio above and the CHD Risk Table to determine the patient's CHD Risk.        ATP III CLASSIFICATION (LDL):  <100     mg/dL   Optimal  100-129  mg/dL   Near or Above                    Optimal  130-159  mg/dL   Borderline  160-189  mg/dL   High  >190     mg/dL   Very High Performed at [facility], [address] [place], [place] [address]   CK     Status: Abnormal   Collection Time: 01/31/18 12:17 PM  Result Value Ref Range   Total CK 39 (L) 49 - 397 U/L    Comment: Performed at [facility], [address], [place], [place] [address]  Ammonia     Status: None   Collection Time: 01/31/18  5:19 PM  Result Value Ref Range   Ammonia 23 9 - 35 umol/L    Comment: Performed at [facility], [address],  [place], [place] [address]  Folate     Status: None   Collection Time: 01/31/18  5:19 PM  Result Value Ref Range   Folate 21.8 >5.9 ng/mL    Comment: Performed at [facility], [address], [place], [place] [address]  Vitamin B12     Status: None   Collection Time: 01/31/18  5:19 PM  Result Value Ref Range   Vitamin B-12 265 180 - 914 pg/mL    Comment: (NOTE) This assay is not validated for testing neonatal or myeloproliferative syndrome specimens for Vitamin B12 levels. Performed at [facility], [address], [place], [place] [address]   Glucose, capillary     Status: Abnormal   Collection Time: 01/31/18 10:12 PM  Result Value Ref Range   Glucose-Capillary 158 (H) 70 - 99 mg/dL  Basic  metabolic panel     Status: Abnormal   Collection Time: 02/01/18  4:39 AM  Result Value Ref Range   Sodium 139 135 - 145 mmol/L   Potassium 3.2 (L) 3.5 - 5.1 mmol/L   Chloride 107 98 - 111 mmol/L   CO2 25 22 - 32 mmol/L   Glucose, Bld 88 70 - 99 mg/dL   BUN 11 8 - 23 mg/dL   Creatinine, Ser 0.66 0.61 - 1.24 mg/dL   Calcium 8.6 (L) 8.9 - 10.3 mg/dL   GFR calc non Af Amer >60 >60 mL/min   GFR calc Af Amer >60 >60 mL/min    Comment: (NOTE) The eGFR has been calculated using the CKD EPI equation. This calculation has not been validated in all clinical situations. eGFR's persistently <60 mL/min signify possible Chronic Kidney Disease.    Anion gap 7 5 - 15    Comment: Performed at [facility], [address], [place], [place] [address]  Magnesium     Status: Abnormal   Collection Time: 02/01/18  4:39 AM  Result Value Ref Range   Magnesium 1.4 (L) 1.7 - 2.4 mg/dL    Comment: Performed at [facility], [address] [place], [place] [address]  CBC     Status: Abnormal   Collection Time: 02/01/18  4:39 AM  Result Value Ref Range   WBC 7.6 4.0 - 10.5 K/uL   RBC 4.28 4.22 - 5.81 MIL/uL   Hemoglobin 12.7 (L) 13.0 - 17.0 g/dL   HCT 37.7 (L) 39.0 - 52.0 %   MCV 88.1 80.0 - 100.0 fL   MCH 29.7  26.0 - 34.0 pg   MCHC 33.7 30.0 - 36.0 g/dL   RDW 13.1 11.5 - 15.5 %   Platelets 128 (L) 150 - 400 K/uL    Comment: PLATELET COUNT CONFIRMED BY SMEAR SPECIMEN CHECKED FOR CLOTS    nRBC 0.0 0.0 - 0.2 %    Comment: Performed at [facility], [address], [place], [place] [address]  Protime-INR     Status: Abnormal   Collection Time: 02/01/18  4:39 AM  Result Value Ref Range   Prothrombin Time 23.7 (H) 11.4 - 15.2 seconds   INR 2.13     Comment: Performed at [facility], [address] [place], [place] [address]  Glucose, capillary     Status: None   Collection Time: 02/01/18  4:41 AM  Result Value Ref Range   Glucose-Capillary 94 70 - 99 mg/dL  Glucose, capillary     Status: Abnormal   Collection Time: 02/01/18 12:07 PM  Result Value Ref Range   Glucose-Capillary 181 (H) 70 - 99 mg/dL  Glucose, capillary     Status: Abnormal   Collection Time: 02/01/18  4:48 PM  Result Value Ref Range   Glucose-Capillary 181 (H) 70 - 99 mg/dL   Comment 1 Notify RN    Comment 2 Document in Chart     Lipid Panel Recent Labs    01/31/18 1217  CHOL 147  TRIG 109  HDL 50  CHOLHDL 2.9  VLDL 22  LDLCALC 75    Studies/Results:  BRAIN MRI FINDINGS: Significantly motion degraded study. Only diffusion and sagittal T1 weighted imaging could be acquired. There is no evidence of infarct, hydrocephalus, or shift. Generalized atrophy.  IMPRESSION: 1. Significantly motion degraded and truncated study. No acute finding. 2. Generalized atrophy     The brain MRI scan is reviewed in person.  The scan is significantly degraded due to  motion artifact.  Only the DWI and ADC scans were obtained.  The coronal scans actually were good and showed no evidence of acute infarct or other acute changes.  There appears to be significant atrophy/age-related findings.  Medications:  Scheduled Meds: . diltiazem  90 mg Oral Q12H  . Influenza vac split quadrivalent PF  0.5 mL Intramuscular Tomorrow-1000    . insulin aspart  0-5 Units Subcutaneous QHS  . insulin aspart  0-9 Units Subcutaneous Q6H  . metoprolol tartrate  5 mg Intravenous Q6H  . thiamine injection  100 mg Intravenous Daily  . warfarin  7.5 mg Oral Once  . Warfarin - Pharmacist Dosing Inpatient   Does not apply q1800   Continuous Infusions: . cefTRIAXone (ROCEPHIN)  IV 1 g (02/01/18 1519)  . dextrose 5 % and 0.45 % NaCl with KCl 40 mEq/L    . magnesium sulfate 1 - 4 g bolus IVPB    . metronidazole 500 mg (02/01/18 1319)  . potassium chloride 10 mEq (02/01/18 1742)   PRN Meds:.acetaminophen **OR** acetaminophen, ondansetron **OR** ondansetron (ZOFRAN) IV     LOS: 1 day   Dona Klemann A. Merlene Laughter, M.D.  Diplomate, Tax adviser of Psychiatry and Neurology ( Neurology).

## 2018-02-01 NOTE — Plan of Care (Signed)
  Problem: Acute Rehab PT Goals(only PT should resolve) Goal: Patient Will Transfer Sit To/From Stand Outcome: Progressing Flowsheets (Taken 02/01/2018 1401) Patient will transfer sit to/from stand: with supervision Goal: Pt Will Transfer Bed To Chair/Chair To Bed Outcome: Progressing Flowsheets (Taken 02/01/2018 1401) Pt will Transfer Bed to Chair/Chair to Bed: with supervision Goal: Pt Will Ambulate Outcome: Progressing Flowsheets (Taken 02/01/2018 1401) Pt will Ambulate: 75 feet; with min guard assist; with rolling walker Goal: Pt Will Go Supine/Side To Sit Outcome: Progressing    2:02 PM, 02/01/18 Lonell Grandchild, MPT Physical Therapist with [facility] [phone] office [phone] mobile phone

## 2018-02-01 NOTE — Progress Notes (Signed)
First attempt at 10:00 patient was off the floor, second attempt, RN was with patient who stated patient needed help from the tech. While I was waiting for the tech, EEG(all the way from [facility]) came to the room. I told her to go ahead and I would come back later.

## 2018-02-01 NOTE — Progress Notes (Signed)
EEG completed, results pending. 

## 2018-02-01 NOTE — Progress Notes (Signed)
*  PRELIMINARY RESULTS* Echocardiogram 2D Echocardiogram has been performed.  [name] 02/01/2018, 3:01 PM

## 2018-02-01 NOTE — Plan of Care (Signed)
  Problem: Acute Rehab OT Goals (only OT should resolve) Goal: Pt. Will Perform Grooming 02/01/2018 0837 by Chryl Heck, OT Flowsheets (Taken 02/01/2018 [phone]) Pt Will Perform Grooming: with min guard assist; standing 02/01/2018 0837 by Chryl Heck, OT Flowsheets (Taken 02/01/2018 [phone]) Pt Will Perform Grooming: with min guard assist Goal: Pt. Will Perform Upper Body Dressing Flowsheets (Taken 02/01/2018 0837) Pt Will Perform Upper Body Dressing: with min guard assist; sitting Goal: Pt. Will Perform Lower Body Dressing Flowsheets (Taken 02/01/2018 0837) Pt Will Perform Lower Body Dressing: with min assist; with caregiver independent in assisting; sitting/lateral leans Goal: Pt. Will Transfer To Toilet Flowsheets (Taken 02/01/2018 [phone]) Pt Will Transfer to Toilet: with min assist; stand pivot transfer; grab bars Goal: Pt. Will Perform Toileting-Clothing Manipulation Flowsheets (Taken 02/01/2018 0837) Pt Will Perform Toileting - Clothing Manipulation and hygiene: with min assist; with caregiver independent in assisting; sitting/lateral leans Goal: Pt/Caregiver Will Perform Home Exercise Program Flowsheets (Taken 02/01/2018 [phone]) Pt/caregiver will Perform Home Exercise Program: Increased strength; Both right and left upper extremity; With written HEP provided; With minimal assist

## 2018-02-01 NOTE — Progress Notes (Signed)
Initial Nutrition Assessment  DOCUMENTATION CODES:      INTERVENTION:  Follow diet advancement and assess adequacy of po intake   NUTRITION DIAGNOSIS:   Inadequate oral intake related to inability to eat-NPO pending ST eval., chewing difficulty and hx of dementia, cancer hx as evidenced by NPO status, percent weight loss(progressive unplanned wt loss the past 2 years.)- prostate and non-hodkins lymphoma   GOAL:   Patient will meet greater than or equal to 90% of their needs  MONITOR:   Diet advancement, Weight trends, Labs, Skin  REASON FOR ASSESSMENT:   Malnutrition Screening Tool    ASSESSMENT: Patient is a 84 yo male who presents after a fall at home. Chief complaints acute mental status changes and decline in functional status. PMH: Dementia, HTN, DM, GERD, Arthritis, non-Hodkins lymphoma, prostate cancer. 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  Hypokalemia -potassium 3.2 and hx B-12 deficency.   Patient has family members present who provided nutrition history. Usual intake is late morning light breakfast after taking medications. Mid-afternoon meal is often a sandwich and later in the evening a small meal or snack. Patient likes Ensure and has been drinking 2 per day. He has some chewing difficulty upper denture and has a few lower teeth in front. His spouse is to bring denture in. Patient is NPO waiting a ST evaluation.  Family reports continued wt loss over the past 2 years- steady, ongoing decline. Chart review shows pt wt of 84.7 kg- 02/05/16, on 02/09/17- wt 78 kg and currently 70 kg 10% decrease the past year and 18% in 2 years- a total of 33 lb (15 kg) during the 2 yr timeframe.    Suspect pt is malnourished based on progressive unplanned weight decline, falls history, increased weakness and chronic disease burden. Will complete physical exam during follow up and elaborate further.  Labs: BMP Latest Ref Rng & Units 02/01/2018 01/31/2018 12/21/2017  Glucose 70 - 99 mg/dL  88 202(H) 162(H)  BUN 8 - 23 mg/dL 11 15 11   Creatinine 0.61 - 1.24 mg/dL 0.66 0.76 0.61  Sodium 135 - 145 mmol/L 139 137 138  Potassium 3.5 - 5.1 mmol/L 3.2(L) 3.1(L) 4.1  Chloride 98 - 111 mmol/L 107 100 104  CO2 22 - 32 mmol/L 25 27 29   Calcium 8.9 - 10.3 mg/dL 8.6(L) 9.7 9.5    Medications reviewed and include: SSI, Thiamine, coumadin, rocephin, flagyl  Diet Order:   Diet Order            Diet NPO time specified Except for: Sips with Meds  Diet effective now              EDUCATION NEEDS:  Not appropriate for education at this time Skin:  Skin Assessment: Reviewed RN Assessment- abrasions present from fall   Last BM:  10/14 type 7  Height:   Ht Readings from Last 1 Encounters:  01/31/18 6' (1.829 m)    Weight:   Wt Readings from Last 1 Encounters:  01/31/18 70 kg    Ideal Body Weight:  81 kg  BMI:  Body mass index is 20.93 kg/m.  Estimated Nutritional Needs:   Kcal:  1820-1960  (26-28 kcal/kg/bw)  Protein:  84-90 gr (1.2-1.3 gr/kg/bw)  Fluid:  1.8-2.0 liters daily   Colman Cater MS,RD,CSG,LDN Office: [phone] Pager: [phone]

## 2018-02-01 NOTE — Evaluation (Addendum)
Occupational Therapy Evaluation Patient Details Name: [name] MRN: [id number] DOB: 1924/01/21 Today's Date: 02/01/2018    History of Present Illness [name] is a 84 y.o. male with medical history significant for atrial fibrillation on Coumadin, hypertension, diabetes, dementia, and non-Hodgkin's lymphoma who was brought to the emergency department today after he was found down earlier in the morning in his bedroom after a fall from his bed.  His wife was downstairs in the basement cleaning and she found him this way with some blood surrounding him.  She states that the patient has been more confused in the last several days and has had difficulty with interactions with family members and could not clearly follow the football game he was watching yesterday.  Patient is noted to have some dementia that has been worsening over the last month and continues to take his medications including Coumadin for atrial fibrillation.   Clinical Impression   Pt presents supine in bed accompanied by daughter in room. Prior to admission, pt receiving assistance for ADLs from wife and pt using walker for home mobility. Pt appears to be close to baseline at this time. Recommend HHOT evaluation in home environment to assess and improve pt's safety and independence in ADL completion. Pt will continue to receive skilled OT services while in acute care to work on deficits mentioned above. Thank you for this referral.     Follow Up Recommendations  Home health OT    Equipment Recommendations  None recommended by OT       Precautions / Restrictions Precautions Precautions: Fall Restrictions Weight Bearing Restrictions: No      Mobility Bed Mobility Overal bed mobility: Needs Assistance Bed Mobility: Supine to Sit     Supine to sit: Min guard     General bed mobility comments: Pt required min verbal cuing for problem solving how to rise from bed using rail to pull himself  up.  Transfers Overall transfer level: Needs assistance Equipment used: Rolling walker (2 wheeled) Transfers: Stand Pivot Transfers   Stand pivot transfers: Min guard       General transfer comment: Pt required min guard and min verbal cues for stand pivot transfer         ADL either performed or assessed with clinical judgement   ADL Overall ADL's : Needs assistance/impaired                                       General ADL Comments: Pt appears to be close to baseline at this time, needing min guard to min assist for ADLs.     Vision Baseline Vision/History: No visual deficits              Pertinent Vitals/Pain Pain Assessment: No/denies pain     Hand Dominance Right   Extremity/Trunk Assessment Upper Extremity Assessment Upper Extremity Assessment: Generalized weakness   Lower Extremity Assessment Lower Extremity Assessment: Defer to PT evaluation   Cervical / Trunk Assessment Cervical / Trunk Assessment: Normal   Communication     Cognition Arousal/Alertness: Awake/alert Behavior During Therapy: WFL for tasks assessed/performed Overall Cognitive Status: Within Functional Limits for tasks assessed  Home Living Family/patient expects to be discharged to:: Private residence Living Arrangements: Spouse/significant other Available Help at Discharge: Family;Available 24 hours/day Type of Home: House Home Access: Stairs to enter [employer] of Steps: 3 in front and 4 steps in back, 1 siderail on right side going up in front Entrance Stairs-Rails: Right Home Layout: Able to live on main level with bedroom/bathroom;One level;Laundry or work area in [place] Shower/Tub: Teacher, early years/pre: [place]: Environmental consultant - 2 wheels;Bedside commode;Shower seat;Cane - single point          Prior Functioning/Environment Level of  Independence: Needs assistance        Comments: Pt assisted with ADLs by wife at baseline        OT Problem List: Decreased strength;Decreased activity tolerance;Decreased safety awareness;Decreased knowledge of use of DME or AE;Decreased knowledge of precautions;Increased edema;Impaired UE functional use      OT Treatment/Interventions: Self-care/ADL training;Therapeutic exercise;Energy conservation;DME and/or AE instruction;Manual therapy;Therapeutic activities;Cognitive remediation/compensation;Patient/family education    OT Goals(Current goals can be found in the care plan section) Acute Rehab OT Goals Patient Stated Goal: To return home and remain as independent as possible. OT Goal Formulation: With patient/family Time For Goal Achievement: 02/15/18 Potential to Achieve Goals: Good ADL Goals Pt Will Perform Grooming: with min guard assist;standing Pt Will Perform Upper Body Dressing: with min guard assist;sitting Pt Will Perform Lower Body Dressing: with min assist;with caregiver independent in assisting;sitting/lateral leans Pt Will Transfer to Toilet: with min assist;stand pivot transfer;grab bars Pt Will Perform Toileting - Clothing Manipulation and hygiene: with min assist;with caregiver independent in assisting;sitting/lateral leans Pt/caregiver will Perform Home Exercise Program: Increased strength;Both right and left upper extremity;With written HEP provided;With minimal assist  OT Frequency: Min 2X/week    AM-PAC PT "6 Clicks" Daily Activity     Outcome Measure Help from another person eating meals?: A Little Help from another person taking care of personal grooming?: A Little Help from another person toileting, which includes using toliet, bedpan, or urinal?: A Little Help from another person bathing (including washing, rinsing, drying)?: A Little Help from another person to put on and taking off regular upper body clothing?: A Little Help from another person to put  on and taking off regular lower body clothing?: A Little 6 Click Score: 18   End of Session Equipment Utilized During Treatment: Gait belt;Rolling walker Nurse Communication: Mobility status;Other (comment)(Notified nursing that his hand bandage was leaking. )  Activity Tolerance: Patient tolerated treatment well Patient left: in chair;with call bell/phone within reach;with chair alarm set;with family/visitor present  OT Visit Diagnosis: Muscle weakness (generalized) (M62.81)                Time: 2878-6767 OT Time Calculation (min): 47 min Charges:  OT General Charges $OT Visit: 1 Visit OT Evaluation $OT Eval Low Complexity: 1 Low   Guadelupe Sabin, OTR/L  [phone] 02/01/2018, 11:15 AM

## 2018-02-01 NOTE — Progress Notes (Signed)
PROGRESS NOTE    [name]  BSJ:[id number] DOB: 10/18/1923 DOA: 01/31/2018 PCP: Rosita Fire, MD    Brief Narrative:  84 year old male with a history of atrial fibrillation on Coumadin, stage IV prostate cancer, diabetes, hypertension, was brought to the hospital when he was found at home, on the floor by his wife.  He had altered mental status, was increasingly confused/lethargic.  He was admitted for further work-up.  Neurology following.  MRI brain did not show any acute infarct.  EEG has been ordered.  He still somewhat dysarthric.  Also found to have signs of pneumonia which is felt to be aspiration.  On IV antibiotics.   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 Acute encephalopathy with recent fall.  Etiology is unclear.  Neurology following.  MRI brain did not show any acute infarct.  EEG has been done with report pending.  Seen by neurology who felt that medications may also be playing a role.  Other etiologies include possible postconcussive syndrome.  At this point, he appears to be dysarthric. 2. Pneumonia, likely aspiration.  With ongoing dysarthria, he would certainly be at risk for aspiration.  Currently on IV antibiotics.  Speech therapy has been consulted.  Currently n.p.o. 3. Atrial fibrillation.  Rate controlled with IV metoprolol for now.  Anticoagulated with Coumadin.  INR is therapeutic. 4. Prostate cancer with bone mets.  Followed by oncology clinic.  On oral therapy with long-term prednisone.  He takes prednisone 2.5 mg twice daily.  Hemodynamics currently stable.  Resume prednisone once  he is able to take p.o. 5. Diabetes.  Blood sugars currently stable.  Continue serial checks.  Continue sliding scale insulin.  Oral agents currently on hold. 6. Hypokalemia.  Replace.  Magnesium is also low and will be replaced 7. Hypertension.  Lisinopril, Lasix and diltiazem currently on hold.  Will resume once able to take p.o.  Continue IV metoprolol for now.   DVT prophylaxis: coumadin Code Status: full code Family Communication: discussed with daughter at the bedside Disposition Plan: discharge home once improved with home health   Consultants:   Neurology  Procedures:  Echo: - Left ventricle: The cavity size was normal. Wall thickness was   increased in a pattern of mild LVH. Systolic function was normal.   The estimated ejection fraction was in the range of 55% to 60%.   Wall motion was normal; there were no regional wall motion   abnormalities. The study was not technically sufficient to allow   evaluation of LV diastolic dysfunction due to atrial   fibrillation. - Aortic valve: Moderately calcified annulus. Trileaflet; mildly   thickened, mildly calcified leaflets. Morphologically, there was   at least mild calcific aortic stenosis if not mild to moderate   stenosis. - Mitral valve: There was mild regurgitation. - Inferior vena cava: The vessel was dilated. The respirophasic   diameter changes were blunted (< 50%), consistent with elevated    central venous pressure. Estimated CVP 15 mmHg.  Antimicrobials:   Ceftriaxone 10/14 >  Metronidazole 10/14 >   Subjective: Daughter says that speech was more clear earlier  this morning.  After working with physical therapy, she is noted that his speech has become harder to understand.  Objective: Vitals:   01/31/18 1245 01/31/18 2214 02/01/18 0443 02/01/18 1500  BP: 107/62 (!) 156/75 138/79 (!) 137/95  Pulse: 99 85 94 88  Resp: 20 18 18 20   Temp:  97.7 F (36.5 C) 98.4 F (36.9 C) 98.3 F (36.8 C)  TempSrc:  Oral  Oral   SpO2: 98% 97% 99% 97%  Weight:      Height:        Intake/Output Summary (Last 24 hours) at 02/01/2018 1730 Last data filed at 02/01/2018 1430 Gross per 24 hour  Intake 1634.82 ml  Output -  Net 1634.82 ml   Filed Weights   01/31/18 1101  Weight: 70 kg    Examination:  General exam: Appears calm and comfortable  Respiratory system: Clear to auscultation. Respiratory effort normal. Cardiovascular system: S1 & S2 heard, irregular. No JVD, murmurs, rubs, gallops or clicks. No pedal edema. Gastrointestinal system: Abdomen is nondistended, soft and nontender. No organomegaly or masses felt. Normal bowel sounds heard. Central nervous system: Significantly dysarthric, limited exam since he has trouble following commands Extremities: Symmetric 5 x 5 power. Skin: No rashes, lesions or ulcers Psychiatry: Cannot comprehend speech    Data Reviewed: I have personally reviewed following labs and imaging studies  CBC: Recent Labs  Lab 01/31/18 1144 02/01/18 0439  WBC 11.2* 7.6  NEUTROABS 7.4  --   HGB 14.4 12.7*  HCT 40.9 37.7*  MCV 88.1 88.1  PLT 176 161*   Basic Metabolic Panel: Recent Labs  Lab 01/31/18 1144 02/01/18 0439  NA 137 139  K 3.1* 3.2*  CL 100 107  CO2 27 25  GLUCOSE 202* 88  BUN 15 11  CREATININE 0.76 0.66  CALCIUM 9.7 8.6*  MG  --  1.4*   GFR: Estimated Creatinine Clearance: 58.3 mL/min (by C-G formula based on SCr of 0.66 mg/dL). Liver Function Tests: Recent Labs  Lab 01/31/18 1144  AST 20  ALT 15  ALKPHOS 72  BILITOT 1.2  PROT 7.2  ALBUMIN 4.1   No results for input(s): LIPASE, AMYLASE in the last 168 hours. Recent Labs  Lab 01/31/18 1719  AMMONIA 23   Coagulation Profile: Recent Labs  Lab 01/31/18 1144 02/01/18 0439  INR 1.99 2.13   Cardiac Enzymes: Recent Labs  Lab 01/31/18 1217  CKTOTAL 39*   BNP (last 3 results) No results for input(s): PROBNP in the last 8760 hours. HbA1C: Recent Labs    01/31/18 1217    HGBA1C 6.3*   CBG: Recent Labs  Lab 01/31/18 1108 01/31/18 2212 02/01/18 0441 02/01/18 1207 02/01/18 1648  GLUCAP 206* 158* 94 181* 181*   Lipid Profile: Recent Labs    01/31/18 1217  CHOL 147  HDL 50  LDLCALC 75  TRIG 109  CHOLHDL 2.9   Thyroid Function Tests: Recent Labs    01/31/18 1217  TSH 4.363   Anemia Panel: Recent Labs    01/31/18 1719  VITAMINB12 265  FOLATE 21.8   Sepsis Labs: No results for input(s): PROCALCITON, LATICACIDVEN in the last 168 hours.  No results found for this or any previous visit (from the past 240 hour(s)).       Radiology Studies: Ct Angio Head W Or Wo Contrast  Result Date: 01/31/2018 CLINICAL DATA:  Right-sided neglect. Speech disturbance. History of fall. No acute finding on previous head CT. EXAM: CT ANGIOGRAPHY HEAD AND NECK TECHNIQUE: Multidetector  CT imaging of the head and neck was performed using the standard protocol during bolus administration of intravenous contrast. Multiplanar CT image reconstructions and MIPs were obtained to evaluate the vascular anatomy. Carotid stenosis measurements (when applicable) are obtained utilizing NASCET criteria, using the distal internal carotid diameter as the denominator. CONTRAST:  63mL ISOVUE-370 IOPAMIDOL (ISOVUE-370) INJECTION 76% COMPARISON:  CT earlier same day. FINDINGS: CTA NECK FINDINGS Aortic arch: Aortic atherosclerosis. No aneurysm or dissection. 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 No origin stenoses identified. Right carotid system: Common carotid artery widely patent to the bifurcation region. Atherosclerotic calcification at the carotid bifurcation. Minimal proximal ICA diameter 5.8 mm, therefore no stenosis. Cervical ICA widely patent beyond that. Left carotid system: Common carotid artery widely patent to the bifurcation. Calcified plaque at the carotid bifurcation and ICA bulb. Minimal ICA  diameter 6 mm, therefore no stenosis. Cervical ICA widely patent beyond that. Vertebral arteries: Right vertebral artery is dominant with a normal origin. Left vertebral artery originates from the arch as noted above. Both vessels are widely patent at their origins and through the cervical region to the foramen magnum. Skeleton: Ordinary mid cervical spondylosis. Other neck: No mass or lymphadenopathy. Upper chest: Mild emphysema and scarring.  No active process. Review of the MIP images confirms the above findings CTA HEAD FINDINGS Anterior circulation: Both internal carotid arteries are patent through the skull base and siphon regions. There is ordinary peripheral calcification in the carotid siphon regions but no stenosis. The anterior and middle cerebral vessels are patent without proximal stenosis, aneurysm or vascular malformation. No missing branch vessels are identified. Posterior circulation: Left vertebral artery terminates in PICA. Right vertebral artery supplies the basilar. No basilar stenosis. Posterior circulation branch vessels are normal. Fetal origin right PCA. Venous sinuses: Patent and normal. Anatomic variants: None significant. Delayed phase: No abnormal enhancement. Review of the MIP images confirms the above findings IMPRESSION: No large or medium vessel occlusion identified. Nonstenotic atherosclerotic disease of the aortic arch, carotid bifurcations and carotid siphon regions. These results were called by telephone at the time of interpretation on 01/31/2018 at 2:12 pm to Dr. Davonna Belling , who verbally acknowledged these results. Electronically Signed   By: Nelson Chimes M.D.   On: 01/31/2018 14:16   Dg Chest 2 View  Result Date: 01/31/2018 CLINICAL DATA:  84 year old male with a history of fall EXAM: CHEST - 2 VIEW COMPARISON:  12/30/2017, 11/25/2017 FINDINGS: Cardiomediastinal silhouette unchanged in size and contour. Reticular opacity of the left lower lobe partially obscuring  left hemidiaphragm in the left heart border, on the frontal and lateral views. No pleural effusion. No pneumothorax. No significant interlobular septal thickening. No displaced fracture. IMPRESSION: Airspace opacity of the left lower lobe concerning for infection. Electronically Signed   By: Corrie Mckusick D.O.   On: 01/31/2018 13:46   Dg Forearm Right  Result Date: 01/31/2018 CLINICAL DATA:  Right forearm swelling after fall today. EXAM: RIGHT FOREARM - 2 VIEW COMPARISON:  None. FINDINGS: There is no evidence of fracture or other focal bone lesions. Vascular calcifications are noted. Otherwise soft tissues are unremarkable. IMPRESSION: No acute abnormality seen in the right forearm. Electronically Signed   By: Marijo Conception, M.D.   On: 01/31/2018 13:46   Dg Wrist Complete Right  Result Date: 01/31/2018 CLINICAL DATA:  Right wrist swelling after fall today. EXAM: RIGHT WRIST - COMPLETE 3+ VIEW COMPARISON:  None. FINDINGS: There is  no evidence of fracture or dislocation. There is no evidence of arthropathy or other focal bone abnormality. Soft tissues are unremarkable. IMPRESSION: Negative. Electronically Signed   By: Marijo Conception, M.D.   On: 01/31/2018 13:47   Ct Head Wo Contrast  Result Date: 01/31/2018 CLINICAL DATA:  84 year old male post fall.  Initial encounter. EXAM: CT HEAD WITHOUT CONTRAST CT CERVICAL SPINE WITHOUT CONTRAST TECHNIQUE: Multidetector CT imaging of the head and cervical spine was performed following the standard protocol without intravenous contrast. Multiplanar CT image reconstructions of the cervical spine were also generated. COMPARISON:  09/29/2017 head CT.  No comparison cervical spine CT. FINDINGS: CT HEAD FINDINGS Brain: No intracranial hemorrhage or CT evidence of large acute infarct. Chronic microvascular changes. Global atrophy. No intracranial mass lesion noted on this unenhanced exam. Vascular: Vascular calcifications.  No hyperdense vessel. Skull: No skull  fracture. Sinuses/Orbits: Post lens replacement. No acute orbital abnormality. Visualized paranasal sinuses clear. Other: Partial opacification right mastoid air cells without obstructing lesion of eustachian tube. This was noted previously. Soft tissue swelling right occipital region. CT CERVICAL SPINE FINDINGS Alignment: Minimal curvature. Skull base and vertebrae: No cervical spine fracture. Soft tissues and spinal canal: No abnormal prevertebral soft tissue swelling. Disc levels: Cervical spondylotic changes most notable C5-6 with mild spinal stenosis and slight cord flattening C4-5 through C6-7. Upper chest: No worrisome lung mass. Other: Carotid bifurcation calcifications. IMPRESSION: 1. No skull fracture or intracranial hemorrhage. 2. No cervical spine fracture or abnormal prevertebral soft tissue swelling. 3. Chronic changes as detailed above. Electronically Signed   By: Genia Del M.D.   On: 01/31/2018 11:56   Ct Angio Neck W And/or Wo Contrast  Result Date: 01/31/2018 CLINICAL DATA:  Right-sided neglect. Speech disturbance. History of fall. No acute finding on previous head CT. EXAM: CT ANGIOGRAPHY HEAD AND NECK TECHNIQUE: Multidetector CT imaging of the head and neck was performed using the standard protocol during bolus administration of intravenous contrast. Multiplanar CT image reconstructions and MIPs were obtained to evaluate the vascular anatomy. Carotid stenosis measurements (when applicable) are obtained utilizing NASCET criteria, using the distal internal carotid diameter as the denominator. CONTRAST:  48mL ISOVUE-370 IOPAMIDOL (ISOVUE-370) INJECTION 76% COMPARISON:  CT earlier same day. FINDINGS: CTA NECK FINDINGS Aortic arch: Aortic atherosclerosis. No aneurysm or dissection. 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 No origin stenoses identified. Right carotid system: Common carotid artery widely patent to the  bifurcation region. Atherosclerotic calcification at the carotid bifurcation. Minimal proximal ICA diameter 5.8 mm, therefore no stenosis. Cervical ICA widely patent beyond that. Left carotid system: Common carotid artery widely patent to the bifurcation. Calcified plaque at the carotid bifurcation and ICA bulb. Minimal ICA diameter 6 mm, therefore no stenosis. Cervical ICA widely patent beyond that. Vertebral arteries: Right vertebral artery is dominant with a normal origin. Left vertebral artery originates from the arch as noted above. Both vessels are widely patent at their origins and through the cervical region to the foramen magnum. Skeleton: Ordinary mid cervical spondylosis. Other neck: No mass or lymphadenopathy. Upper chest: Mild emphysema and scarring.  No active process. Review of the MIP images confirms the above findings CTA HEAD FINDINGS Anterior circulation: Both internal carotid arteries are patent through the skull base and siphon regions. There is ordinary peripheral calcification in the carotid siphon regions but no stenosis. The anterior and middle cerebral vessels are patent without proximal stenosis, aneurysm or vascular malformation. No missing  branch vessels are identified. Posterior circulation: Left vertebral artery terminates in PICA. Right vertebral artery supplies the basilar. No basilar stenosis. Posterior circulation branch vessels are normal. Fetal origin right PCA. Venous sinuses: Patent and normal. Anatomic variants: None significant. Delayed phase: No abnormal enhancement. Review of the MIP images confirms the above findings IMPRESSION: No large or medium vessel occlusion identified. Nonstenotic atherosclerotic disease of the aortic arch, carotid bifurcations and carotid siphon regions. These results were called by telephone at the time of interpretation on 01/31/2018 at 2:12 pm to Dr. Davonna Belling , who verbally acknowledged these results. Electronically Signed   By: Nelson Chimes M.D.   On: 01/31/2018 14:16   Ct Cervical Spine Wo Contrast  Result Date: 01/31/2018 CLINICAL DATA:  83 year old male post fall.  Initial encounter. EXAM: CT HEAD WITHOUT CONTRAST CT CERVICAL SPINE WITHOUT CONTRAST TECHNIQUE: Multidetector CT imaging of the head and cervical spine was performed following the standard protocol without intravenous contrast. Multiplanar CT image reconstructions of the cervical spine were also generated. COMPARISON:  09/29/2017 head CT.  No comparison cervical spine CT. FINDINGS: CT HEAD FINDINGS Brain: No intracranial hemorrhage or CT evidence of large acute infarct. Chronic microvascular changes. Global atrophy. No intracranial mass lesion noted on this unenhanced exam. Vascular: Vascular calcifications.  No hyperdense vessel. Skull: No skull fracture. Sinuses/Orbits: Post lens replacement. No acute orbital abnormality. Visualized paranasal sinuses clear. Other: Partial opacification right mastoid air cells without obstructing lesion of eustachian tube. This was noted previously. Soft tissue swelling right occipital region. CT CERVICAL SPINE FINDINGS Alignment: Minimal curvature. Skull base and vertebrae: No cervical spine fracture. Soft tissues and spinal canal: No abnormal prevertebral soft tissue swelling. Disc levels: Cervical spondylotic changes most notable C5-6 with mild spinal stenosis and slight cord flattening C4-5 through C6-7. Upper chest: No worrisome lung mass. Other: Carotid bifurcation calcifications. IMPRESSION: 1. No skull fracture or intracranial hemorrhage. 2. No cervical spine fracture or abnormal prevertebral soft tissue swelling. 3. Chronic changes as detailed above. Electronically Signed   By: Genia Del M.D.   On: 01/31/2018 11:56   Mr Brain Wo Contrast  Result Date: 02/01/2018 CLINICAL DATA:  Altered mental status for 2 days EXAM: MRI HEAD WITHOUT CONTRAST TECHNIQUE: Multiplanar, multiecho pulse sequences of the brain and surrounding  structures were obtained without intravenous contrast. COMPARISON:  CTA head neck from yesterday FINDINGS: Significantly motion degraded study. Only diffusion and sagittal T1 weighted imaging could be acquired. There is no evidence of infarct, hydrocephalus, or shift. Generalized atrophy. IMPRESSION: 1. Significantly motion degraded and truncated study. No acute finding. 2. Generalized atrophy. Electronically Signed   By: Monte Fantasia M.D.   On: 02/01/2018 10:40        Scheduled Meds: . diltiazem  90 mg Oral Q12H  . Influenza vac split quadrivalent PF  0.5 mL Intramuscular Tomorrow-1000  . insulin aspart  0-5 Units Subcutaneous QHS  . insulin aspart  0-9 Units Subcutaneous Q6H  . metoprolol tartrate  5 mg Intravenous Q6H  . thiamine injection  100 mg Intravenous Daily  . warfarin  7.5 mg Oral Once  . Warfarin - Pharmacist Dosing Inpatient   Does not apply q1800   Continuous Infusions: . cefTRIAXone (ROCEPHIN)  IV 1 g (02/01/18 1519)  . dextrose 5 % and 0.45 % NaCl with KCl 40 mEq/L    . magnesium sulfate 1 - 4 g bolus IVPB    . metronidazole 500 mg (02/01/18 1319)  . potassium chloride  LOS: 1 day    Time spent: 68mins    Kathie Dike, MD [facility] Pager [phone]  If 7PM-7AM, please contact night-coverage [email or web address] Password [email or web address] 02/01/2018, 5:30 PM

## 2018-02-02 LAB — CBC
HCT: 36.8 % — ABNORMAL LOW (ref 39.0–52.0)
Hemoglobin: 12.2 g/dL — ABNORMAL LOW (ref 13.0–17.0)
MCH: 30.7 pg (ref 26.0–34.0)
MCHC: 33.2 g/dL (ref 30.0–36.0)
MCV: 92.5 fL (ref 80.0–100.0)
Platelets: 164 10*3/uL (ref 150–400)
RBC: 3.98 MIL/uL — ABNORMAL LOW (ref 4.22–5.81)
RDW: 13 % (ref 11.5–15.5)
WBC: 7.7 10*3/uL (ref 4.0–10.5)
nRBC: 0 % (ref 0.0–0.2)

## 2018-02-02 LAB — GLUCOSE, CAPILLARY
Glucose-Capillary: 134 mg/dL — ABNORMAL HIGH (ref 70–99)
Glucose-Capillary: 116 mg/dL — ABNORMAL HIGH (ref 70–99)
Glucose-Capillary: 162 mg/dL — ABNORMAL HIGH (ref 70–99)
Glucose-Capillary: 170 mg/dL — ABNORMAL HIGH (ref 70–99)

## 2018-02-02 LAB — BASIC METABOLIC PANEL
Anion gap: 8 (ref 5–15)
BUN: 11 mg/dL (ref 8–23)
CO2: 25 mmol/L (ref 22–32)
Calcium: 8.6 mg/dL — ABNORMAL LOW (ref 8.9–10.3)
Chloride: 106 mmol/L (ref 98–111)
Creatinine, Ser: 0.75 mg/dL (ref 0.61–1.24)
GFR calc Af Amer: >60 mL/min (ref >60)
GFR calc non Af Amer: >60 mL/min (ref >60)
Glucose, Bld: 145 mg/dL — ABNORMAL HIGH (ref 70–99)
Potassium: 4.2 mmol/L (ref 3.5–5.1)
Sodium: 139 mmol/L (ref 135–145)

## 2018-02-02 LAB — HOMOCYSTEINE: Homocysteine: 15.8 umol/L — ABNORMAL HIGH (ref 0.0–15.0)

## 2018-02-02 LAB — PROTIME-INR
INR: 1.83
Prothrombin Time: 20.9 seconds — ABNORMAL HIGH (ref 11.4–15.2)

## 2018-02-02 LAB — MAGNESIUM: Magnesium: 2.5 mg/dL — ABNORMAL HIGH (ref 1.7–2.4)

## 2018-02-02 MED ORDER — WARFARIN SODIUM 7.5 MG PO TABS
7.5000 mg | ORAL_TABLET | Freq: Once | ORAL | Status: AC
  Administered 2018-02-02: 7.5 mg via ORAL
  Filled 2018-02-02: qty 1

## 2018-02-02 NOTE — Progress Notes (Addendum)
ANTICOAGULATION CONSULT NOTE -  Pharmacy Consult for warfarin Indication: atrial fibrillation  Allergies  Allergen Reactions  . Augmentin [Amoxicillin-Pot Clavulanate] Rash    Redness of skin    Patient Measurements: Height: 6' (182.9 cm) Weight: 154 lb 5.2 oz (70 kg) IBW/kg (Calculated) : 77.6  Vital Signs: Temp: 97.8 F (36.6 C) (10/16 0424) Temp Source: Oral (10/16 0424) BP: 127/73 (10/16 0424) Pulse Rate: 84 (10/16 0424)  Labs: Recent Labs    01/31/18 1144 01/31/18 1217 02/01/18 0439 02/02/18 0501  HGB 14.4  --  12.7* 12.2*  HCT 40.9  --  37.7* 36.8*  PLT 176  --  128* 164  LABPROT 22.5*  --  23.7* 20.9*  INR 1.99  --  2.13 1.83  CREATININE 0.76  --  0.66 0.75  CKTOTAL  --  39*  --   --     Estimated Creatinine Clearance: 58.3 mL/min (by C-G formula based on SCr of 0.75 mg/dL).   Medical History: Past Medical History:  Diagnosis Date  . Acute bronchitis   . Arthritis   . Atrial fibrillation ([place])    Dr. Harl Bowie- [place] follows saw 11'14  . Bladder stones    tx. with oral meds and antibiotics, now surgery planned  . Cataracts, both eyes    surgery planned May 2015  . Diabetes mellitus without complication ([place])    Type II  . Dyspnea    with activity  . Dysrhythmia    Afib  . Family history of breast cancer   . Family history of colon cancer   . GERD (gastroesophageal reflux disease)   . 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 Margot Chimes, thus far without recurrence.   . Hypertension   . NHL (non-Hodgkin's lymphoma) ([place]) 2005   Diffuse large B-cell lymphoma, clinically stage IIIA, CD20 positive, status post cervical lymph node biopsy 08/03/2003 on the left. PET scan was also positive in the spleen and small bowel  region, but bone marrow aspiration and biopsy were negative. So he essentially had stage IIIAs. He received R-CHOP x6 cycles with CR established by PET scan criteria on 11/23/2003 with no evidence for relapse th  . PAD (peripheral artery disease) ([place]) 08/27/2017  . Skin cancer    Basal cell- face,head, neck,  arms, legs, Back    Medications:  Medications Prior to Admission  Medication Sig Dispense Refill Last Dose  . abiraterone acetate (ZYTIGA) 250 MG tablet Take 4 tablets (1,000 mg total) by mouth daily. Take on an empty stomach 1 hour before or 2 hours after a meal 120 tablet 6 01/31/2018 at Unknown time  . amitriptyline (ELAVIL) 25 MG tablet Take 25 mg by mouth at bedtime.   01/30/2018 at Unknown time  . CALCIUM CARB-CHOLECALCIFEROL PO Take 1 tablet by mouth daily.    01/31/2018 at Unknown time  . CRANBERRY PO Take 4,200 mg by mouth daily.   01/31/2018 at Unknown time  . cyanocobalamin (,VITAMIN B-12,) 1000 MCG/ML injection Inject 1,000 mcg every 30 (thirty) days into the skin.   Past Month at Unknown time  . denosumab (XGEVA) 120 MG/1.7ML SOLN injection Inject 120 mg into the skin every 30 (thirty) days.    Past Month at Unknown time  . diltiazem (CARDIZEM) 90 MG tablet TAKE 1 TABLET BY  MOUTH TWICE DAILY -- **NEEDS APPOINTMENT FOR FURTHER REFILLS** 60 tablet 0 01/31/2018 at Unknown time  . diltiazem (CARDIZEM) 90 MG tablet TAKE 1 TABLET BY MOUTH TWICE DAILY -- **NEEDS APPOINTMENT FOR FURTHER REFILLS** 60 tablet 0 01/30/2018 at Unknown time  . fluconazole (DIFLUCAN) 100 MG tablet Take 100 mg by mouth as needed.   unknown  . furosemide (LASIX) 20 MG tablet Take 1 tablet (20 mg total) by mouth daily. 30 tablet 11 01/31/2018 at Unknown time  . glipiZIDE (GLUCOTROL XL) 5 MG 24 hr tablet Take 5 mg by mouth daily with breakfast.    01/31/2018 at Unknown time  . guaiFENesin (MUCINEX) 600 MG 12 hr tablet Take 600 mg by mouth as needed.   unknown  . lisinopril (PRINIVIL,ZESTRIL) 20 MG tablet Take 1  tablet (20 mg total) daily by mouth. 30 tablet 11 01/31/2018 at Unknown time  . metFORMIN (GLUCOPHAGE) 850 MG tablet Take 500 mg by mouth 2 (two) times daily with a meal.    01/31/2018 at Unknown time  . mirtazapine (REMERON) 15 MG tablet Take 15 mg by mouth at bedtime. 1/2 of 15mg    01/30/2018 at Unknown time  . omeprazole (PRILOSEC) 40 MG capsule Take 40 mg by mouth daily.  3 01/31/2018 at Unknown time  . potassium chloride (K-DUR,KLOR-CON) 10 MEQ tablet Take 2 tablets (20 mEq total) by mouth daily. 60 tablet 0 01/31/2018 at Unknown time  . predniSONE (DELTASONE) 5 MG tablet Take 0.5 tablets (2.5 mg total) by mouth 2 (two) times daily with a meal. 90 tablet 1 01/31/2018 at Unknown time  . warfarin (COUMADIN) 2.5 MG tablet Take 2 - 3 tablets daily as directed by the anticoagulation clinic 75 tablet 3 01/30/2018 at Unknown time  . acetaminophen (TYLENOL) 500 MG tablet Take 500-1,000 mg by mouth daily as needed for mild pain, fever or headache.   unknown  . apixaban (ELIQUIS) 5 MG TABS tablet Take 1 tablet (5 mg total) by mouth 2 (two) times daily. (Patient not taking: Reported on 01/31/2018) 60 tablet 3 Not Taking at Unknown time  . clotrimazole-betamethasone (LOTRISONE) cream Apply 1 application topically daily as needed (for fungus).    Taking  . diphenhydrAMINE (BENADRYL) 25 MG tablet Take 25 mg by mouth every 6 (six) hours as needed for itching.   Not Taking at Unknown time  . docusate sodium (COLACE) 100 MG capsule Take 100 mg by mouth daily as needed for mild constipation.   unknown  . Leuprolide Acetate, 6 Month, (LUPRON DEPOT) 45 MG injection Inject 45 mg into the muscle every 6 (six) months.   Taking  . loperamide (IMODIUM) 2 MG capsule Take 2 mg by mouth as needed for diarrhea or loose stools.   unknown  . nystatin (MYCOSTATIN) powder Apply 1 g topically 4 (four) times daily as needed (fungus).    unknown  . phenylephrine-shark liver oil-mineral oil-petrolatum (PREPARATION H) 0.25-3-14-71.9  % rectal ointment Place 1 application rectally 2 (two) times daily as needed (for chaffing).   Not Taking at Unknown time  . sennosides-docusate sodium (SENOKOT-S) 8.6-50 MG tablet Take 1 tablet by mouth daily as needed for constipation.    unknown  . VENTOLIN HFA 108 (90 Base) MCG/ACT inhaler Inhale 1-2 puffs into the lungs every 6 (six) hours as needed for wheezing or shortness of breath.    Taking   Scheduled:  . diltiazem  90 mg Oral Q12H  . insulin aspart  0-5 Units Subcutaneous QHS  . insulin aspart  0-9  Units Subcutaneous Q6H  . metoprolol tartrate  5 mg Intravenous Q6H  . thiamine injection  100 mg Intravenous Daily  . warfarin  7.5 mg Oral Once  . Warfarin - Pharmacist Dosing Inpatient   Does not apply q1800   Infusions:  . cefTRIAXone (ROCEPHIN)  IV 1 g (02/01/18 1519)  . dextrose 5 % and 0.45 % NaCl with KCl 40 mEq/L 75 mL/hr at 02/02/18 0841  . metronidazole 500 mg (02/02/18 1027)   PRN: acetaminophen **OR** acetaminophen, ondansetron **OR** ondansetron (ZOFRAN) IV Anti-infectives (From admission, onward)   Start     Dose/Rate Route Frequency Ordered Stop   02/01/18 1500  cefTRIAXone (ROCEPHIN) 1 g in sodium chloride 0.9 % 100 mL IVPB     1 g 200 mL/hr over 30 Minutes Intravenous Every 24 hours 01/31/18 1543     01/31/18 1545  metroNIDAZOLE (FLAGYL) IVPB 500 mg     500 mg 100 mL/hr over 60 Minutes Intravenous Every 8 hours 01/31/18 1543     01/31/18 1415  cefTRIAXone (ROCEPHIN) 1 g in sodium chloride 0.9 % 100 mL IVPB     1 g 200 mL/hr over 30 Minutes Intravenous  Once 01/31/18 1403 01/31/18 1516      Assessment: 84 year old male taking warfarin 5mg  to 7.5mg  po daily at home, INR currently 1.83. Patient's home dose of warfarin is 7.5 mg every Tue,Fri and 5 mg ROW.  Patient has been NPO for past two days so did not receive warfarin but currently taking meds by mouth.   Goal of Therapy:  INR 2-3 Monitor platelets by anticoagulation protocol: Yes   Plan:  Warfarin  7.5 mg po x 1 dose tonight Will follow INR tomorrow  Watch for signs and symptoms of bleeding and lab work  [employer] 02/02/2018,11:09 AM

## 2018-02-02 NOTE — NC FL2 (Signed)
[place] LEVEL OF CARE SCREENING TOOL     IDENTIFICATION  Patient Name: [name] Birthdate: [birth year] Sex: male Admission Date (Current Location): 01/31/2018  [facility] and [place] Number:  [employer] and Address:  [place] [address], [place]      Provider Number: [id number]  Attending Physician Name and Address:  Isaac Bliss, [place] Name and Phone Number:  Daymon Hora (wife) [phone]    Current Level of Care: Hospital Recommended Level of Care: [place] Prior Approval Number:    Date Approved/Denied:   PASRR Number: [id number]  Discharge Plan: SNF    Current Diagnoses: Patient Active Problem List   Diagnosis Date Noted  . Encephalopathy acute 01/31/2018  . Aspiration pneumonia ([place]) 01/31/2018  . Diarrhea 11/25/2017  . Acute lower UTI 11/25/2017  . PAD (peripheral artery disease) ([place]) 08/27/2017  . Great toe amputation status, left 07/30/2017  . Osteomyelitis of toe of left foot ([place]) 06/29/2017  . Acute bronchitis with bronchospasm 03/16/2017  . Type 2 diabetes mellitus ([place]) 03/16/2017  . Hypomagnesemia 03/16/2017  . 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 Postural dizziness with presyncope 02/19/2017  . Polyneuropathy 02/19/2017  . Numbness 02/19/2017  . Urinary frequency 02/19/2017  . Genetic testing 02/09/2017  . Family history of breast cancer   . Family history of colon cancer   . Other dysphagia 01/21/2016  . Bone metastases ([place]) 08/21/2015  . Prostate cancer ([place]) 09/08/2013  . Hypertrophy of prostate with urinary obstruction and other lower urinary tract symptoms (LUTS) 08/10/2013  . Encounter for therapeutic drug monitoring 06/12/2013  . NHL (non-Hodgkin's lymphoma) ([place]) 08/18/2011  . GIST (gastrointestinal stromal tumor), malignant ([place]) 08/18/2011  . Long term (current) use of anticoagulants 07/18/2010  . Essential hypertension  10/04/2008  . ATRIAL FIBRILLATION 10/04/2008    Orientation RESPIRATION BLADDER Height & Weight     Self, Place  Normal Incontinent Weight: 154 lb 5.2 oz (70 kg) Height:  6' (182.9 cm)  BEHAVIORAL SYMPTOMS/MOOD NEUROLOGICAL BOWEL NUTRITION STATUS      Continent Diet(see dc summary)  AMBULATORY STATUS COMMUNICATION OF NEEDS Skin   Extensive Assist Verbally Normal                       Personal Care Assistance Level of Assistance  Bathing, Feeding, Dressing Bathing Assistance: Limited assistance Feeding assistance: Independent Dressing Assistance: Limited assistance     Functional Limitations Info  Sight, Hearing, Speech Sight Info: Adequate Hearing Info: Adequate Speech Info: Adequate    SPECIAL CARE FACTORS FREQUENCY  PT (By licensed PT)     PT Frequency: 5 times week              Contractures Contractures Info: Not present    Additional Factors Info  Code Status, Allergies, Psychotropic Code Status Info: full Allergies Info: Augmentin Psychotropic Info: Remeron         Current Medications (02/02/2018):  This is the current hospital active medication list Current Facility-Administered Medications  Medication Dose Route Frequency Provider Last Rate Last Dose  . acetaminophen (TYLENOL) tablet 650 mg  650 mg Oral Q6H PRN Manuella Ghazi, Pratik D, DO       Or  . acetaminophen (TYLENOL) suppository 650 mg  650 mg Rectal Q6H PRN Manuella Ghazi, Pratik D, DO      . cefTRIAXone (ROCEPHIN) 1 g in sodium chloride 0.9 % 100 mL IVPB  1 g Intravenous Q24H Manuella Ghazi, Pratik  D, DO 200 mL/hr at 02/01/18 1519 1 g at 02/01/18 1519  . dextrose 5 % and 0.45 % NaCl with KCl 40 mEq/L infusion   Intravenous Continuous Kathie Dike, MD 75 mL/hr at 02/02/18 0841    . diltiazem (CARDIZEM) tablet 90 mg  90 mg Oral Q12H Shah, Pratik D, DO   90 mg at 02/02/18 0854  . insulin aspart (novoLOG) injection 0-5 Units  0-5 Units Subcutaneous QHS Manuella Ghazi, Pratik D, DO      . insulin aspart (novoLOG) injection 0-9  Units  0-9 Units Subcutaneous Q6H Shah, Pratik D, DO   1 Units at 02/02/18 0438  . metoprolol tartrate (LOPRESSOR) injection 5 mg  5 mg Intravenous Q6H Kathie Dike, MD   5 mg at 02/02/18 1220  . metroNIDAZOLE (FLAGYL) IVPB 500 mg  500 mg Intravenous Q8H Shah, Pratik D, DO 100 mL/hr at 02/02/18 1027 500 mg at 02/02/18 1027  . ondansetron (ZOFRAN) tablet 4 mg  4 mg Oral Q6H PRN Manuella Ghazi, Pratik D, DO       Or  . ondansetron (ZOFRAN) injection 4 mg  4 mg Intravenous Q6H PRN Manuella Ghazi, Pratik D, DO   4 mg at 02/01/18 1133  . thiamine (B-1) injection 100 mg  100 mg Intravenous Daily Phillips Odor, MD   100 mg at 02/02/18 0846  . warfarin (COUMADIN) tablet 7.5 mg  7.5 mg Oral Once Isaac Bliss, Rayford Halsted, MD      . Warfarin - Pharmacist Dosing Inpatient   Does not apply q1800 Coffee, Donna Christen [facility]         Discharge Medications: Please see discharge summary for a list of discharge medications.  Relevant Imaging Results:  Relevant Lab Results:   Additional Information SSN: [place], LCSW

## 2018-02-02 NOTE — Clinical Social Work Note (Signed)
Clinical Social Work Assessment  Patient Details  Name: [name] MRN: [id number] Date of Birth: 02-23-1924  Date of referral:  02/02/18               Reason for consult:  Facility Placement                Permission sought to share information with:    Permission granted to share information::     Name::        Agency::     Relationship::     Contact Information:  Mrs. Elkhatib, spouse; [name], daughter; son in law; granddaughter and great granddaughter were all at bedside.  Housing/Transportation Living arrangements for the past 2 months:  [place] of Information:  Patient, Spouse, Adult Children Patient Interpreter Needed:  None Criminal Activity/Legal Involvement Pertinent to Current Situation/Hospitalization:  No - Comment as needed Significant Relationships:  Adult Children, Significant Other, Other Family Members Lives with:  Spouse Do you feel safe going back to the place where you live?  Yes Need for family participation in patient care:  Yes (Comment)  Care giving concerns:  Spouse is primary caregiver.    Social Worker assessment / plan:  At baseline patient ambulates with a walker.  He stopped driving 1.5 months ago.  Patient receives assistance with ADLs from his spouse.  His daughter and son in law live beside him.  His daughter from [facility] is available to stay several days in the home as well. Patient has other children who are willing and able to assist in his care needs.  Mrs. Fikes is agreeable to short term rehab at [facility]. She stresses that she only desires short term rehab. Patient and his wife have been married over 18 years.  We discuss what a good spouse patient is and how well Mrs. Catanzaro has cared for him.   Employment status:    Insurance information:  Medicare PT Recommendations:  [place] / Referral to community resources:  [place]  Patient/Family's Response to care:  Patient's family is  agreeable to short term rehab.  Patient/Family's Understanding of and Emotional Response to Diagnosis, Current Treatment, and Prognosis:  Patient and family understand patient's diagnosis, treatment and prognosis and feel that short term rehab is a good choice to assist in strength building.   Emotional Assessment Appearance:  Appears stated age Attitude/Demeanor/Rapport:    Affect (typically observed):  Accepting Orientation:  Oriented to Self, Oriented to Place Alcohol / Substance use:  Not Applicable Psych involvement (Current and /or in the community):  No (Comment)  Discharge Needs  Concerns to be addressed:    Readmission within the last 30 days:  No Current discharge risk:  None Barriers to Discharge:  Other(Familiy currently only want [facility] and [facility] cannot make a bed offer. )   Raiya Stainback D, LCSW 02/02/2018, 1:55 PM

## 2018-02-02 NOTE — Progress Notes (Addendum)
PROGRESS NOTE    EROS MONTOUR  BPZ:[id number] DOB: 12-04-1923 DOA: 01/31/2018 PCP: Rosita Fire, MD     Brief Narrative:  84 year old man admitted from home on 10/14 due to increasing confusion.  He has a history of atrial fibrillation on chronic anticoagulation with Coumadin, stage IV prostate cancer, diabetes and hypertension.  MRI was negative for acute CVA.  He was also found to have signs of pneumonia.  Admission was requested for further evaluation and management.   Assessment & Plan:   Principal Problem:   Encephalopathy acute Active Problems:   Essential hypertension   ATRIAL FIBRILLATION   NHL (non-Hodgkin's lymphoma) (HCC)   GIST (gastrointestinal stromal tumor), malignant (HCC)   Prostate cancer (HCC)   Bone metastases (HCC)   Neuropathy associated with MGUS (HCC)   Type 2 diabetes mellitus ([place])   PAD (peripheral artery disease) ([place])   Aspiration pneumonia ([place])   Acute metabolic encephalopathy -Suspect in large part due to aspiration pneumonia. -Medications might also be playing a role. -MRI and EEG have been nondiagnostic, neurology is following for recommendations.  They have recommended reducing his psychotropic medications including Remeron, amitriptyline and Benadryl. -He has had a functional deterioration of his cognition and ambulation in the last few months likely a function of aging and repeated hospitalizations.  Aspiration pneumonia -Seen by speech therapy with recommendation for a dysphagia 3 diet with thin liquids. -Continue Rocephin and Flagyl for presumed aspiration pneumonia given penicillin allergy.  Atrial fibrillation, permanent -Has remained rate controlled, anticoagulated on Coumadin.  Stage IV prostate cancer -With bone mets. -Continue follow-up with oncology.  Diabetes -Fair control, continue current management.  Hypertension -BP meds remain on hold, if continues to tolerate diet today, will resume in  a.m.  Hypokalemia/hypomagnesemia -Replaced on 10/16.   DVT prophylaxis: Warfarin Code Status: Full code Family Communication: Wife and daughter at bedside updated on plan of care and all questions answered Disposition Plan: SNF once medically stable, anticipate 48 to 72 hours  Consultants:   Neurology  Procedures:   None  Antimicrobials:  Anti-infectives (From admission, onward)   Start     Dose/Rate Route Frequency Ordered Stop   02/01/18 1500  cefTRIAXone (ROCEPHIN) 1 g in sodium chloride 0.9 % 100 mL IVPB     1 g 200 mL/hr over 30 Minutes Intravenous Every 24 hours 01/31/18 1543     01/31/18 1545  metroNIDAZOLE (FLAGYL) IVPB 500 mg     500 mg 100 mL/hr over 60 Minutes Intravenous Every 8 hours 01/31/18 1543     01/31/18 1415  cefTRIAXone (ROCEPHIN) 1 g in sodium chloride 0.9 % 100 mL IVPB     1 g 200 mL/hr over 30 Minutes Intravenous  Once 01/31/18 1403 01/31/18 1516       Subjective: Sitting in chair at bedside, still appears confused, per family members is slightly improved from admission.  Has no specific complaints.  Objective: Vitals:   02/02/18 0016 02/02/18 0424 02/02/18 1445 02/02/18 1612  BP: (!) 95/59 127/73 115/62 108/68  Pulse: (!) 48 84 (!) 45 76  Resp: 20 20  20   Temp: 97.8 F (36.6 C) 97.8 F (36.6 C) 98.3 F (36.8 C) 97.7 F (36.5 C)  TempSrc: Oral Oral Oral Oral  SpO2: 97% 97% 99% 100%  Weight:      Height:        Intake/Output Summary (Last 24 hours) at 02/02/2018 1621 Last data filed at 02/02/2018 0640 Gross per 24 hour  Intake 1185 ml  Output  550 ml  Net 635 ml   Filed Weights   01/31/18 1101  Weight: 70 kg    Examination:  General exam: Alert, awake, oriented x 2, to person and place, not to time Respiratory system: Clear to auscultation. Respiratory effort normal. Cardiovascular system:RRR. No murmurs, rubs, gallops. Gastrointestinal system: Abdomen is nondistended, soft and nontender. No organomegaly or masses felt.  Normal bowel sounds heard. Central nervous system: Alert and oriented. No focal neurological deficits. Extremities: No C/C/E, +pedal pulses Skin: No rashes, lesions or ulcers Psychiatry: Judgement and insight appear normal. Mood & affect appropriate.     Data Reviewed: I have personally reviewed following labs and imaging studies  CBC: Recent Labs  Lab 01/31/18 1144 02/01/18 0439 02/02/18 0501  WBC 11.2* 7.6 7.7  NEUTROABS 7.4  --   --   HGB 14.4 12.7* 12.2*  HCT 40.9 37.7* 36.8*  MCV 88.1 88.1 92.5  PLT 176 128* 268   Basic Metabolic Panel: Recent Labs  Lab 01/31/18 1144 02/01/18 0439 02/02/18 0501  NA 137 139 139  K 3.1* 3.2* 4.2  CL 100 107 106  CO2 27 25 25   GLUCOSE 202* 88 145*  BUN 15 11 11   CREATININE 0.76 0.66 0.75  CALCIUM 9.7 8.6* 8.6*  MG  --  1.4* 2.5*   GFR: Estimated Creatinine Clearance: 58.3 mL/min (by C-G formula based on SCr of 0.75 mg/dL). Liver Function Tests: Recent Labs  Lab 01/31/18 1144  AST 20  ALT 15  ALKPHOS 72  BILITOT 1.2  PROT 7.2  ALBUMIN 4.1   No results for input(s): LIPASE, AMYLASE in the last 168 hours. Recent Labs  Lab 01/31/18 1719  AMMONIA 23   Coagulation Profile: Recent Labs  Lab 01/31/18 1144 02/01/18 0439 02/02/18 0501  INR 1.99 2.13 1.83   Cardiac Enzymes: Recent Labs  Lab 01/31/18 1217  CKTOTAL 39*   BNP (last 3 results) No results for input(s): PROBNP in the last 8760 hours. HbA1C: Recent Labs    01/31/18 1217  HGBA1C 6.3*   CBG: Recent Labs  Lab 02/01/18 1207 02/01/18 1648 02/01/18 2151 02/02/18 0419 02/02/18 0756  GLUCAP 181* 181* 173* 134* 116*   Lipid Profile: Recent Labs    01/31/18 1217  CHOL 147  HDL 50  LDLCALC 75  TRIG 109  CHOLHDL 2.9   Thyroid Function Tests: Recent Labs    01/31/18 1217  TSH 4.363   Anemia Panel: Recent Labs    01/31/18 1719  VITAMINB12 265  FOLATE 21.8   Urine analysis:    Component Value Date/Time   COLORURINE YELLOW 01/31/2018  1110   APPEARANCEUR HAZY (A) 01/31/2018 1110   LABSPEC 1.027 01/31/2018 1110   PHURINE 5.0 01/31/2018 1110   GLUCOSEU NEGATIVE 01/31/2018 1110   HGBUR NEGATIVE 01/31/2018 [place] 01/31/2018 1110   KETONESUR NEGATIVE 01/31/2018 1110   PROTEINUR NEGATIVE 01/31/2018 1110   NITRITE NEGATIVE 01/31/2018 1110   LEUKOCYTESUR TRACE (A) 01/31/2018 1110   Sepsis Labs: @LABRCNTIP (procalcitonin:4,lacticidven:4)  )No results found for this or any previous visit (from the past 240 hour(s)).       Radiology Studies: Mr Brain Wo Contrast  Result Date: 02/01/2018 CLINICAL DATA:  Altered mental status for 2 days EXAM: MRI HEAD WITHOUT CONTRAST TECHNIQUE: Multiplanar, multiecho pulse sequences of the brain and surrounding structures were obtained without intravenous contrast. COMPARISON:  CTA head neck from yesterday FINDINGS: Significantly motion degraded study. Only diffusion and sagittal T1 weighted imaging could be acquired. There is no  evidence of infarct, hydrocephalus, or shift. Generalized atrophy. IMPRESSION: 1. Significantly motion degraded and truncated study. No acute finding. 2. Generalized atrophy. Electronically Signed   By: Monte Fantasia M.D.   On: 02/01/2018 10:40        Scheduled Meds: . diltiazem  90 mg Oral Q12H  . insulin aspart  0-5 Units Subcutaneous QHS  . insulin aspart  0-9 Units Subcutaneous Q6H  . metoprolol tartrate  5 mg Intravenous Q6H  . thiamine injection  100 mg Intravenous Daily  . warfarin  7.5 mg Oral Once  . Warfarin - Pharmacist Dosing Inpatient   Does not apply q1800   Continuous Infusions: . cefTRIAXone (ROCEPHIN)  IV 1 g (02/01/18 1519)  . dextrose 5 % and 0.45 % NaCl with KCl 40 mEq/L 75 mL/hr at 02/02/18 0841  . metronidazole 500 mg (02/02/18 1027)     LOS: 2 days    Time spent: 35 minutes. Greater than 50% of this time was spent in direct contact with the patient and with patient's family, coordinating care and  discussing relevant ongoing clinical issues, including treatment plan for aspiration pneumonia, goal to minimize psychotropic medications, plans for SNF following hospitalization for short-term rehab purposes.     Lelon Frohlich, MD [facility] Pager [phone]  If 7PM-7AM, please contact night-coverage [email or web address] Password [email or web address] 02/02/2018, 4:21 PM   \

## 2018-02-02 NOTE — Progress Notes (Signed)
Drsg changed to right arm, tolerated procedure.Will continue to monitor patient.

## 2018-02-02 NOTE — Progress Notes (Signed)
Occupational Therapy Treatment Patient Details Name: [name] MRN: [id number] DOB: 1923-07-07 Today's Date: 02/02/2018    History of present illness [name] is a 84 y.o. male with medical history significant for atrial fibrillation on Coumadin, hypertension, diabetes, dementia, and non-Hodgkin's lymphoma who was brought to the emergency department today after he was found down earlier in the morning in his bedroom after a fall from his bed.  His wife was downstairs in the basement cleaning and she found him this way with some blood surrounding him.  She states that the patient has been more confused in the last several days and has had difficulty with interactions with family members and could not clearly follow the football game he was watching yesterday.  Patient is noted to have some dementia that has been worsening over the last month and continues to take his medications including Coumadin for atrial fibrillation.   OT comments  Pt presents supine in bed with daughters present in room, agreeable to see OT. Pt able to participate in treatment session with good activity tolerance during bed mobility, transfer, and grooming task. Pt is making steady progress towards his goals and will benefit from continued skilled OT services while in acute setting. Pt appears to be close to baseline functioning, with reports from family that his cognition and memory fluctuates throughout the day.    Follow Up Recommendations  Home health OT;SNF;Supervision/Assistance - 24 hour    Equipment Recommendations  None recommended by OT       Precautions / Restrictions Precautions Precautions: Fall Restrictions Weight Bearing Restrictions: No       Mobility Bed Mobility Overal bed mobility: Needs Assistance Bed Mobility: Supine to Sit;Sit to Supine     Supine to sit: Min assist     General bed mobility comments: Pt required min assist as well as verbal cues to problem solve bed mobility  from supine to seated.   Transfers Overall transfer level: Needs assistance Equipment used: Rolling walker (2 wheeled) Transfers: Sit to/from [facility] Sit to Stand: Min guard Stand pivot transfers: Min guard       General transfer comment: Pt required min guard and min verbal cues for stand pivot transfer         ADL either performed or assessed with clinical judgement   ADL Overall ADL's : Needs assistance/impaired     Grooming: Wash/dry face;Sitting;Oral care;Minimal assistance;Set up Grooming Details (indicate cue type and reason): OT provided set up for oral hygiene task. OT provided pt with clean dentures and toothbrush. Pt required verbal cues for problem solving how to place toothpaste on toothbrush. Pt also able to wash face with wet towel provided while seated. Pt required min verbal cues for sequencing of task.                                               Cognition Arousal/Alertness: Awake/alert Behavior During Therapy: WFL for tasks assessed/performed Overall Cognitive Status: Within Functional Limits for tasks assessed                                                     Pertinent Vitals/ Pain       Pain Assessment:  No/denies pain         Frequency  Min 2X/week        Progress Toward Goals  OT Goals(current goals can now be found in the care plan section)  Progress towards OT goals: Progressing toward goals  Acute Rehab OT Goals Patient Stated Goal: To return home and remain as independent as possible. OT Goal Formulation: With patient/family Time For Goal Achievement: 02/15/18 Potential to Achieve Goals: Good  Plan Frequency remains appropriate       AM-PAC PT "6 Clicks" Daily Activity     Outcome Measure   Help from another person eating meals?: A Little Help from another person taking care of personal grooming?: A Little Help from another person toileting, which includes using  toliet, bedpan, or urinal?: A Little Help from another person bathing (including washing, rinsing, drying)?: A Little Help from another person to put on and taking off regular upper body clothing?: A Little Help from another person to put on and taking off regular lower body clothing?: A Little 6 Click Score: 18    End of Session Equipment Utilized During Treatment: Gait belt;Rolling walker  OT Visit Diagnosis: Muscle weakness (generalized) (M62.81)   Activity Tolerance Patient tolerated treatment well   Patient Left in chair;with call bell/phone within reach;with chair alarm set;with family/visitor present         Time: 0750-0828 OT Time Calculation (min): 38 min  Charges: OT General Charges $OT Visit: 1 Visit OT Treatments $Self Care/Home Management : 38-52 mins   Guadelupe Sabin, OTR/L  [phone] 02/02/2018, 2:17 PM

## 2018-02-02 NOTE — Evaluation (Signed)
Clinical/Bedside Swallow Evaluation Patient Details  Name: [name] MRN: [id number] Date of Birth: 1923-08-24  Today's Date: 02/02/2018 Time: SLP Start Time (ACUTE ONLY): 0900 SLP Stop Time (ACUTE ONLY): 0933 SLP Time Calculation (min) (ACUTE ONLY): 33 min  Past Medical History:  Past Medical History:  Diagnosis Date  . Acute bronchitis   . Arthritis   . Atrial fibrillation ([place])    Dr. Harl Bowie- [facility] follows saw 11'14  . Bladder stones    tx. with oral meds and antibiotics, now surgery planned  . Cataracts, both eyes    surgery planned May 2015  . Diabetes mellitus without complication ([name])    Type II  . Dyspnea    with activity  . Dysrhythmia    Afib  . Family history of breast cancer   . Family history of colon cancer   . GERD (gastroesophageal reflux disease)   . 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 Margot Chimes, thus far without recurrence.   . Hypertension   . NHL (non-Hodgkin's lymphoma) ([place]) 2005   Diffuse large B-cell lymphoma, clinically stage IIIA, CD20 positive, status post cervical lymph node biopsy 08/03/2003 on the left. PET scan was also positive in the spleen and small bowel region, but bone marrow aspiration and biopsy were negative. So he essentially had stage IIIAs. He received R-CHOP x6 cycles with CR established by PET scan criteria on 11/23/2003 with no evidence for relapse th  . PAD (peripheral artery disease) ([place]) 08/27/2017  . Skin cancer    Basal cell- face,head, neck,  arms, legs, Back   Past Surgical History:  Past Surgical History:  Procedure Laterality Date  . AMPUTATION Left 06/29/2017   Procedure: AMPUTATION LEFT GREAT TOE;  Surgeon: [name], MD;  Location: [facility];  Service: Vascular;   Laterality: Left;  . AMPUTATION Left 08/27/2017   Procedure: AMPUTATION Left SECOND TOE;  Surgeon: [name], MD;  Location: [facility];  Service: Vascular;  Laterality: Left;  . CATARACT EXTRACTION Bilateral 2015  . CHOLECYSTECTOMY    . COLON RESECTION     small bowel  . CYSTOSCOPY WITH LITHOLAPAXY N/A 08/10/2013   Procedure: CYSTOSCOPY WITH LITHOLAPAXY WITH [name];  Surgeon: [name], MD;  [facility];  Service: Urology;  Laterality: N/A;  . ESOPHAGEAL DILATION N/A 02/27/2016   Procedure: ESOPHAGEAL DILATION;  Surgeon: [name], MD;  [facility];  Service: Endoscopy;  Laterality: N/A;  . ESOPHAGOGASTRODUODENOSCOPY N/A 02/27/2016   Procedure: ESOPHAGOGASTRODUODENOSCOPY (EGD);  Surgeon: [name], MD;  [facility];  Service: Endoscopy;  Laterality: N/A;  12:00  . INCISION AND DRAINAGE PERIRECTAL ABSCESS    . LOWER EXTREMITY ANGIOGRAPHY N/A 06/25/2017   Procedure: LOWER EXTREMITY ANGIOGRAPHY;  Surgeon: [name], MD;  Location: [facility];  Service: Cardiovascular;  Laterality: N/A;  . LYMPH NODE DISSECTION Left    '05-neck  . PORT-A-CATH REMOVAL    . PORTACATH PLACEMENT     insertion and removal -last chemotherapy 10 yrs ago  . TRANSURETHRAL RESECTION OF PROSTATE N/A 08/10/2013   Procedure: TRANSURETHRAL RESECTION OF THE PROSTATE WITH GYRUS INSTRUMENTS;  Surgeon: [name], MD;  [facility];  Service: Urology;  Laterality: N/A;   HPI:  84 year old male with a history of  atrial fibrillation on Coumadin, stage IV prostate cancer, diabetes, hypertension, was brought to the hospital when he was found at home, on the floor by his wife.  He had altered mental status, was increasingly confused/lethargic.  He was admitted for further work-up.  Neurology following.  MRI brain did not show any acute infarct.  EEG has been ordered.  He still somewhat dysarthric.  Also found to have signs of pneumonia which is felt to be  aspiration.  On IV antibiotics.   Assessment / Plan / Recommendation Clinical Impression  Clinical swallow evaluation completed while Pt seated upright in chair in the room with family present. Pt indicates that he has had his esophagus dilated in the past with Dr. Laural Golden and also reports occasional difficulty swallowing solids. Oral motor examination is grossly WNL. Pt wears upper dentures and is missing some lower dentition and consumes soft textures at home per Pt/family due to the same. Pt shows no overt signs of aspiration while self presenting cup/straw sips thin water, 4 oz puree, and graham crackers. Pt with mildly prolonged oral phase with solids likely due to sparse lower dentition. Pt does have previous chest x-rays which show possible PNA and chest CT in the past which showed patulous esophagus. Suspect risk for aspiration with during meals is low, however risk increased for post prandial given h/o esophageal dysphagia. Recommend D3/mech soft textures with thin liquids with aspiration and reflux precautions and po medications whole with water when Pt is alert and upright. SLP provided skilled education to Pt and his daughters regarding aspiration and reflux precautions with emphasis on good oral care and remaining upright after meals. No further SLP f/u indicated at this time.   SLP Visit Diagnosis: Dysphagia, oral phase (R13.11)    Aspiration Risk  Mild aspiration risk    Diet Recommendation Dysphagia 3 (Mech soft);Thin liquid   Liquid Administration via: Cup;Straw Medication Administration: Whole meds with liquid Supervision: Patient able to self feed;Intermittent supervision to cue for compensatory strategies Compensations: Minimize environmental distractions;Small sips/bites Postural Changes: Seated upright at 90 degrees;Remain upright for at least 30 minutes after po intake    Other  Recommendations Oral Care Recommendations: Oral care BID;Staff/trained caregiver to provide oral  care Other Recommendations: Clarify dietary restrictions   Follow up Recommendations None      Frequency and Duration            Prognosis Prognosis for Safe Diet Advancement: Good Barriers to Reach Goals: Cognitive deficits      Swallow Study   General Date of Onset: 01/31/18 HPI: 84 year old male with a history of atrial fibrillation on Coumadin, stage IV prostate cancer, diabetes, hypertension, was brought to the hospital when he was found at home, on the floor by his wife.  He had altered mental status, was increasingly confused/lethargic.  He was admitted for further work-up.  Neurology following.  MRI brain did not show any acute infarct.  EEG has been ordered.  He still somewhat dysarthric.  Also found to have signs of pneumonia which is felt to be aspiration.  On IV antibiotics. Type of Study: Bedside Swallow Evaluation Previous Swallow Assessment: None on record Diet Prior to this Study: NPO Temperature Spikes Noted: No Respiratory Status: Room air History of Recent Intubation: No Behavior/Cognition: Alert;Cooperative;Pleasant mood Oral Cavity Assessment: Within Functional Limits Oral Care Completed by SLP: Yes Oral Cavity - Dentition: Dentures, top;Missing dentition Vision: Functional for self-feeding Self-Feeding Abilities: Able to feed self Patient Positioning: Upright in chair Baseline Vocal Quality: Normal(mild  wet quality which cleared with cough) Volitional Cough: Strong Volitional Swallow: Able to elicit    Oral/Motor/Sensory Function Overall Oral Motor/Sensory Function: Within functional limits   Ice Chips Ice chips: Within functional limits Presentation: Spoon   Thin Liquid Thin Liquid: Within functional limits Presentation: Cup;Self Fed;Straw    Nectar Thick Nectar Thick Liquid: Not tested   Honey Thick Honey Thick Liquid: Not tested   Puree Puree: Within functional limits Presentation: Self Fed;Spoon   Solid     Solid: Within functional  limits Presentation: Self Fed Other Comments: mild prolonged oral phase     Thank you,  Genene Churn, [place]  PORTER,DABNEY 02/02/2018,11:50 AM

## 2018-02-02 NOTE — Clinical Social Work Note (Signed)
CSW acknowledges new SNF referral. LCSW left a message for patient's wife requesting return contact.    Lamarco Gudiel, Clydene Pugh, LCSW

## 2018-02-02 NOTE — Care Management Important Message (Signed)
Important Message  Patient Details  Name: AXAVIER PRESSLEY MRN: [id number] Date of Birth: 1923-07-09   Medicare Important Message Given:  Yes    Shelda Altes 02/02/2018, 2:41 PM

## 2018-02-02 NOTE — Progress Notes (Signed)
Physical Therapy Treatment Patient Details Name: [name] MRN: [id number] DOB: 10-11-1923 Today's Date: 02/02/2018    History of Present Illness ENGELBERT SEVIN is a 84 y.o. male with medical history significant for atrial fibrillation on Coumadin, hypertension, diabetes, dementia, and non-Hodgkin's lymphoma who was brought to the emergency department today after he was found down earlier in the morning in his bedroom after a fall from his bed.  His wife was downstairs in the basement cleaning and she found him this way with some blood surrounding him.  She states that the patient has been more confused in the last several days and has had difficulty with interactions with family members and could not clearly follow the football game he was watching yesterday.  Patient is noted to have some dementia that has been worsening over the last month and continues to take his medications including Coumadin for atrial fibrillation.    PT Comments    Patient demonstrates increased endurance/distance for gait training without loss of balance, has kyphotic posture with tendency to lean over RW, demonstrated good return for completing BLE ROM exercises while seated with verbal cues and demonstration and requested to continue sitting up in chair after therapy with family members present in room.  Patient will benefit from continued physical therapy in hospital and recommended venue below to increase strength, balance, endurance for safe ADLs and gait.    Follow Up Recommendations  SNF;Supervision/Assistance - 24 hour;Supervision for mobility/OOB     Equipment Recommendations  None recommended by PT    Recommendations for Other Services       Precautions / Restrictions Precautions Precautions: Fall Restrictions Weight Bearing Restrictions: No    Mobility  Bed Mobility               General bed mobility comments: Patient presents up in chair  Transfers Overall transfer level: Needs  assistance Equipment used: Rolling walker (2 wheeled) Transfers: Sit to/from [facility] Sit to Stand: Min guard;Min assist Stand pivot transfers: Min guard;Min assist       General transfer comment: slow unsteady movement  Ambulation/Gait Ambulation/Gait assistance: Min guard Gait Distance (Feet): 75 Feet Assistive device: Rolling walker (2 wheeled) Gait Pattern/deviations: Decreased step length - right;Decreased step length - left;Decreased stride length Gait velocity: decreased   General Gait Details: demonstrates increased endurance/distance for ambulation with slow slightly labored cadence, no loss of balance   Stairs             Wheelchair Mobility    Modified Rankin (Stroke Patients Only)       Balance Overall balance assessment: Needs assistance Sitting-balance support: Feet supported;No upper extremity supported Sitting balance-Leahy Scale: Good     Standing balance support: Bilateral upper extremity supported;During functional activity Standing balance-Leahy Scale: Fair                              Cognition Arousal/Alertness: Awake/alert Behavior During Therapy: WFL for tasks assessed/performed Overall Cognitive Status: Within Functional Limits for tasks assessed                                        Exercises General Exercises - Lower Extremity Long Arc Quad: Seated;Strengthening;AROM;Both;10 reps Hip Flexion/Marching: Seated;AROM;Strengthening;Both;10 reps Toe Raises: Seated;Strengthening;AROM;Both;10 reps Heel Raises: Seated;AROM;Strengthening;Both;10 reps    General Comments        Pertinent  Vitals/Pain Pain Assessment: No/denies pain    Home Living                      Prior Function            PT Goals (current goals can now be found in the care plan section) Acute Rehab PT Goals Patient Stated Goal: To return home and remain as independent as possible. PT Goal  Formulation: With patient/family Time For Goal Achievement: 02/08/18 Potential to Achieve Goals: Good Progress towards PT goals: Progressing toward goals    Frequency    Min 3X/week      PT Plan Current plan remains appropriate    Co-evaluation              AM-PAC PT "6 Clicks" Daily Activity  Outcome Measure  Difficulty turning over in bed (including adjusting bedclothes, sheets and blankets)?: None Difficulty moving from lying on back to sitting on the side of the bed? : A Little Difficulty sitting down on and standing up from a chair with arms (e.g., wheelchair, bedside commode, etc,.)?: A Little Help needed moving to and from a bed to chair (including a wheelchair)?: A Little Help needed walking in hospital room?: A Little Help needed climbing 3-5 steps with a railing? : A Little 6 Click Score: 19    End of Session   Activity Tolerance: Patient tolerated treatment well;Patient limited by fatigue Patient left: in chair;with call bell/phone within reach Nurse Communication: Mobility status PT Visit Diagnosis: Unsteadiness on feet (R26.81);Other abnormalities of gait and mobility (R26.89);Muscle weakness (generalized) (M62.81)     Time: 4098-1191 PT Time Calculation (min) (ACUTE ONLY): 29 min  Charges:  $Gait Training: 8-22 mins $Therapeutic Exercise: 8-22 mins                     3:31 PM, 02/02/18 Lonell Grandchild, MPT Physical Therapist with [facility] [phone] office [phone] mobile phone

## 2018-02-03 LAB — CBC
HCT: 33.6 % — ABNORMAL LOW (ref 39.0–52.0)
Hemoglobin: 10.9 g/dL — ABNORMAL LOW (ref 13.0–17.0)
MCH: 29.7 pg (ref 26.0–34.0)
MCHC: 32.4 g/dL (ref 30.0–36.0)
MCV: 91.6 fL (ref 80.0–100.0)
Platelets: 132 10*3/uL — ABNORMAL LOW (ref 150–400)
RBC: 3.67 MIL/uL — ABNORMAL LOW (ref 4.22–5.81)
RDW: 13.2 % (ref 11.5–15.5)
WBC: 6.1 10*3/uL (ref 4.0–10.5)
nRBC: 0 % (ref 0.0–0.2)

## 2018-02-03 LAB — GLUCOSE, CAPILLARY
Glucose-Capillary: 104 mg/dL — ABNORMAL HIGH (ref 70–99)
Glucose-Capillary: 117 mg/dL — ABNORMAL HIGH (ref 70–99)
Glucose-Capillary: 153 mg/dL — ABNORMAL HIGH (ref 70–99)
Glucose-Capillary: 197 mg/dL — ABNORMAL HIGH (ref 70–99)

## 2018-02-03 LAB — BASIC METABOLIC PANEL
Anion gap: 6 (ref 5–15)
BUN: 13 mg/dL (ref 8–23)
CO2: 23 mmol/L (ref 22–32)
Calcium: 8 mg/dL — ABNORMAL LOW (ref 8.9–10.3)
Chloride: 109 mmol/L (ref 98–111)
Creatinine, Ser: 0.72 mg/dL (ref 0.61–1.24)
GFR calc Af Amer: >60 mL/min (ref >60)
GFR calc non Af Amer: >60 mL/min (ref >60)
Glucose, Bld: 139 mg/dL — ABNORMAL HIGH (ref 70–99)
Potassium: 4.8 mmol/L (ref 3.5–5.1)
Sodium: 138 mmol/L (ref 135–145)

## 2018-02-03 LAB — PROTIME-INR
INR: 1.7
Prothrombin Time: 19.8 seconds — ABNORMAL HIGH (ref 11.4–15.2)

## 2018-02-03 MED ORDER — CLINDAMYCIN HCL 300 MG PO CAPS: 300.0000 mg | ORAL_CAPSULE | Freq: Three times a day (TID) | ORAL | 0 refills | Status: AC

## 2018-02-03 MED ORDER — WARFARIN SODIUM 7.5 MG PO TABS
7.5000 mg | ORAL_TABLET | Freq: Once | ORAL | Status: AC
  Administered 2018-02-03: 7.5 mg via ORAL
  Filled 2018-02-03: qty 1

## 2018-02-03 NOTE — Care Management Note (Addendum)
Case Management Note  Patient Details  Name: [name] MRN: [id number] Date of Birth: 01-05-1924  Subjective/Objective:   Admitted with encephelopathy. Pt from home with wife, ind with ADL's at baseline, has strong family support. Has all necessary DME at home. PT recommended SNF, pt originally interested then decided he has made enough improvement he can safely go home. Family would like to use [facility] as they used them previously. Aware they have 48 hrs to make first visit.  This is patients 3rd admission ins 6 months.     Action/Plan: DC home today with referral for [facility]. [name], [facility] rep, given referral. DC plan discussed at bedside with daughter today. Referral made to [facility] for frequent admissions.   Expected Discharge Date:       02/03/18           Expected Discharge Plan:  [name]  In-House Referral:  Clinical Social Work  Discharge planning Services  CM Consult  Post Acute Care Choice:  Home Health Choice offered to:  Patient  HH Arranged:  PT, RN [facility] Agency:  [facility]  Status of Service:  Completed, signed off  Sherald Barge, RN 02/03/2018, 1:00 PM

## 2018-02-03 NOTE — Progress Notes (Signed)
ANTICOAGULATION CONSULT NOTE -  Pharmacy Consult for warfarin Indication: atrial fibrillation  Allergies  Allergen Reactions  . Augmentin [Amoxicillin-Pot Clavulanate] Rash    Redness of skin    Patient Measurements: Height: 6' (182.9 cm) Weight: 154 lb 5.2 oz (70 kg) IBW/kg (Calculated) : 77.6  Vital Signs: Temp: 97.7 F (36.5 C) (10/17 0646) Temp Source: Oral (10/17 0646) BP: 140/76 (10/17 0646) Pulse Rate: 60 (10/17 0646)  Labs: Recent Labs    01/31/18 1217 02/01/18 0439 02/02/18 0501 02/03/18 0514  HGB  --  12.7* 12.2* 10.9*  HCT  --  37.7* 36.8* 33.6*  PLT  --  128* 164 132*  LABPROT  --  23.7* 20.9* 19.8*  INR  --  2.13 1.83 1.70  CREATININE  --  0.66 0.75 0.72  CKTOTAL 39*  --   --   --     Estimated Creatinine Clearance: 58.3 mL/min (by C-G formula based on SCr of 0.72 mg/dL).   Medical History: Past Medical History:  Diagnosis Date  . Acute bronchitis   . Arthritis   . Atrial fibrillation ([place])    Dr. Harl Bowie- [facility] follows saw 11'14  . Bladder stones    tx. with oral meds and antibiotics, now surgery planned  . Cataracts, both eyes    surgery planned May 2015  . Diabetes mellitus without complication ([place])    Type II  . Dyspnea    with activity  . Dysrhythmia    Afib  . Family history of breast cancer   . Family history of colon cancer   . GERD (gastroesophageal reflux disease)   . 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 Margot Chimes, thus far without recurrence.   . Hypertension   . NHL (non-Hodgkin's lymphoma) ([place]) 2005   Diffuse large B-cell lymphoma, clinically stage IIIA, CD20 positive, status post cervical lymph node biopsy 08/03/2003 on the left. PET scan was also positive in the spleen and small bowel  region, but bone marrow aspiration and biopsy were negative. So he essentially had stage IIIAs. He received R-CHOP x6 cycles with CR established by PET scan criteria on 11/23/2003 with no evidence for relapse th  . PAD (peripheral artery disease) ([place]) 08/27/2017  . Skin cancer    Basal cell- face,head, neck,  arms, legs, Back    Medications:  Medications Prior to Admission  Medication Sig Dispense Refill Last Dose  . abiraterone acetate (ZYTIGA) 250 MG tablet Take 4 tablets (1,000 mg total) by mouth daily. Take on an empty stomach 1 hour before or 2 hours after a meal 120 tablet 6 01/31/2018 at Unknown time  . amitriptyline (ELAVIL) 25 MG tablet Take 25 mg by mouth at bedtime.   01/30/2018 at Unknown time  . CALCIUM CARB-CHOLECALCIFEROL PO Take 1 tablet by mouth daily.    01/31/2018 at Unknown time  . CRANBERRY PO Take 4,200 mg by mouth daily.   01/31/2018 at Unknown time  . cyanocobalamin (,VITAMIN B-12,) 1000 MCG/ML injection Inject 1,000 mcg every 30 (thirty) days into the skin.   Past Month at Unknown time  . denosumab (XGEVA) 120 MG/1.7ML SOLN injection Inject 120 mg into the skin every 30 (thirty) days.    Past Month at Unknown time  . diltiazem (CARDIZEM) 90 MG tablet TAKE 1 TABLET BY  MOUTH TWICE DAILY -- **NEEDS APPOINTMENT FOR FURTHER REFILLS** 60 tablet 0 01/31/2018 at Unknown time  . diltiazem (CARDIZEM) 90 MG tablet TAKE 1 TABLET BY MOUTH TWICE DAILY -- **NEEDS APPOINTMENT FOR FURTHER REFILLS** 60 tablet 0 01/30/2018 at Unknown time  . fluconazole (DIFLUCAN) 100 MG tablet Take 100 mg by mouth as needed.   unknown  . furosemide (LASIX) 20 MG tablet Take 1 tablet (20 mg total) by mouth daily. 30 tablet 11 01/31/2018 at Unknown time  . glipiZIDE (GLUCOTROL XL) 5 MG 24 hr tablet Take 5 mg by mouth daily with breakfast.    01/31/2018 at Unknown time  . guaiFENesin (MUCINEX) 600 MG 12 hr tablet Take 600 mg by mouth as needed.   unknown  . lisinopril (PRINIVIL,ZESTRIL) 20 MG tablet Take 1  tablet (20 mg total) daily by mouth. 30 tablet 11 01/31/2018 at Unknown time  . metFORMIN (GLUCOPHAGE) 850 MG tablet Take 500 mg by mouth 2 (two) times daily with a meal.    01/31/2018 at Unknown time  . mirtazapine (REMERON) 15 MG tablet Take 15 mg by mouth at bedtime. 1/2 of 15mg    01/30/2018 at Unknown time  . omeprazole (PRILOSEC) 40 MG capsule Take 40 mg by mouth daily.  3 01/31/2018 at Unknown time  . potassium chloride (K-DUR,KLOR-CON) 10 MEQ tablet Take 2 tablets (20 mEq total) by mouth daily. 60 tablet 0 01/31/2018 at Unknown time  . predniSONE (DELTASONE) 5 MG tablet Take 0.5 tablets (2.5 mg total) by mouth 2 (two) times daily with a meal. 90 tablet 1 01/31/2018 at Unknown time  . warfarin (COUMADIN) 2.5 MG tablet Take 2 - 3 tablets daily as directed by the anticoagulation clinic 75 tablet 3 01/30/2018 at Unknown time  . acetaminophen (TYLENOL) 500 MG tablet Take 500-1,000 mg by mouth daily as needed for mild pain, fever or headache.   unknown  . apixaban (ELIQUIS) 5 MG TABS tablet Take 1 tablet (5 mg total) by mouth 2 (two) times daily. (Patient not taking: Reported on 01/31/2018) 60 tablet 3 Not Taking at Unknown time  . clotrimazole-betamethasone (LOTRISONE) cream Apply 1 application topically daily as needed (for fungus).    Taking  . diphenhydrAMINE (BENADRYL) 25 MG tablet Take 25 mg by mouth every 6 (six) hours as needed for itching.   Not Taking at Unknown time  . docusate sodium (COLACE) 100 MG capsule Take 100 mg by mouth daily as needed for mild constipation.   unknown  . Leuprolide Acetate, 6 Month, (LUPRON DEPOT) 45 MG injection Inject 45 mg into the muscle every 6 (six) months.   Taking  . loperamide (IMODIUM) 2 MG capsule Take 2 mg by mouth as needed for diarrhea or loose stools.   unknown  . nystatin (MYCOSTATIN) powder Apply 1 g topically 4 (four) times daily as needed (fungus).    unknown  . phenylephrine-shark liver oil-mineral oil-petrolatum (PREPARATION H) 0.25-3-14-71.9  % rectal ointment Place 1 application rectally 2 (two) times daily as needed (for chaffing).   Not Taking at Unknown time  . sennosides-docusate sodium (SENOKOT-S) 8.6-50 MG tablet Take 1 tablet by mouth daily as needed for constipation.    unknown  . VENTOLIN HFA 108 (90 Base) MCG/ACT inhaler Inhale 1-2 puffs into the lungs every 6 (six) hours as needed for wheezing or shortness of breath.    Taking   Scheduled:  . diltiazem  90 mg Oral Q12H  . insulin aspart  0-5 Units Subcutaneous QHS  . insulin aspart  0-9  Units Subcutaneous Q6H  . metoprolol tartrate  5 mg Intravenous Q6H  . thiamine injection  100 mg Intravenous Daily  . Warfarin - Pharmacist Dosing Inpatient   Does not apply q1800   Infusions:  . cefTRIAXone (ROCEPHIN)  IV 1 g (02/02/18 1705)  . metronidazole 500 mg (02/03/18 1118)   PRN: acetaminophen **OR** acetaminophen, ondansetron **OR** ondansetron (ZOFRAN) IV Anti-infectives (From admission, onward)   Start     Dose/Rate Route Frequency Ordered Stop   02/01/18 1500  cefTRIAXone (ROCEPHIN) 1 g in sodium chloride 0.9 % 100 mL IVPB     1 g 200 mL/hr over 30 Minutes Intravenous Every 24 hours 01/31/18 1543     01/31/18 1545  metroNIDAZOLE (FLAGYL) IVPB 500 mg     500 mg 100 mL/hr over 60 Minutes Intravenous Every 8 hours 01/31/18 1543     01/31/18 1415  cefTRIAXone (ROCEPHIN) 1 g in sodium chloride 0.9 % 100 mL IVPB     1 g 200 mL/hr over 30 Minutes Intravenous  Once 01/31/18 1403 01/31/18 1516      Assessment: 84 year old male taking warfarin 5mg  to 7.5mg  po daily at home, INR currently 1.70. Patient's home dose of warfarin is 7.5 mg every Tue,Fri and 5 mg ROW.  .   Goal of Therapy:  INR 2-3 Monitor platelets by anticoagulation protocol: Yes   Plan:  Warfarin 7.5 mg po x 1 dose tonight Will follow INR tomorrow  Watch for signs and symptoms of bleeding and lab work  [employer] 02/03/2018,11:24 AM

## 2018-02-03 NOTE — Progress Notes (Signed)
Physical Therapy Treatment Patient Details Name: [name] MRN: [id number] DOB: 08/23/1923 Today's Date: 02/03/2018    History of Present Illness [name] is a 84 y.o. male with medical history significant for atrial fibrillation on Coumadin, hypertension, diabetes, dementia, and non-Hodgkin's lymphoma who was brought to the emergency department today after he was found down earlier in the morning in his bedroom after a fall from his bed.  His wife was downstairs in the basement cleaning and she found him this way with some blood surrounding him.  She states that the patient has been more confused in the last several days and has had difficulty with interactions with family members and could not clearly follow the football game he was watching yesterday.  Patient is noted to have some dementia that has been worsening over the last month and continues to take his medications including Coumadin for atrial fibrillation.    PT Comments    Pt progressing well with goals able to ambulate 240ft with RW and supervision.  Pt still needs min assist for bed mobility.   Follow Up Recommendations  SNF;Supervision/Assistance - 24 hour;Supervision for mobility/OOB;Home health PT     Equipment Recommendations  None recommended by PT    Recommendations for Other Services  OT     Precautions / Restrictions Precautions Precautions: None Restrictions Weight Bearing Restrictions: No    Mobility  Bed Mobility Overal bed mobility: Needs Assistance Bed Mobility: Supine to Sit     Supine to sit: Min guard        Transfers Overall transfer level: Modified independent Equipment used: Rolling walker (2 wheeled) Transfers: Sit to/from [facility] Sit to Stand: Modified independent (Device/Increase time) Stand pivot transfers: Modified independent (Device/Increase time)          Ambulation/Gait Ambulation/Gait assistance: Modified independent (Device/Increase  time) Gait Distance (Feet): 200 Feet Assistive device: Rolling walker (2 wheeled) Gait Pattern/deviations: WFL(Within Functional Limits) Gait velocity: normal for age    General Gait Details: demonstrates increased endurance/distance for ambulation   Stairs             Wheelchair Mobility    Modified Rankin (Stroke Patients Only)          Cognition Arousal/Alertness: Awake/alert Behavior During Therapy: WFL for tasks assessed/performed Overall Cognitive Status: Within Functional Limits for tasks assessed                                        Exercises General Exercises - Lower Extremity Ankle Circles/Pumps: Both;10 reps;Supine Quad Sets: Both;10 reps;Supine Gluteal Sets: Both;10 reps;Supine Heel Slides: Both;10 reps;Supine Hip ABduction/ADduction: Both;10 reps;Supine                Prior Function   PT lives with wife who assists in his needs.          PT Goals (current goals can now be found in the care plan section) Acute Rehab PT Goals Patient Stated Goal: To return home and remain as independent as possible. PT Goal Formulation: With patient/family Time For Goal Achievement: 02/08/18 Potential to Achieve Goals: Good Progress towards PT goals: Progressing toward goals    Frequency    Min 3X/week      PT Plan Current plan remains appropriate          End of Session   Activity Tolerance: Patient tolerated treatment well Patient left: in chair;with call bell/phone  within reach;with family/visitor present Nurse Communication: Mobility status PT Visit Diagnosis: Unsteadiness on feet (R26.81);Other abnormalities of gait and mobility (R26.89);Muscle weakness (generalized) (M62.81)     Time: 8938-1017 PT Time Calculation (min) (ACUTE ONLY): 44 min  Charges:  $Gait Training: 8-22 mins $Therapeutic Exercise: 8-22 mins                       Rayetta Humphrey, PT CLT [phone] 02/03/2018, 11:00 AM

## 2018-02-03 NOTE — Discharge Summary (Signed)
Physician Discharge Summary  [name] KVQ:[id number] DOB: 07/29/1923 DOA: 01/31/2018  PCP: Rosita Fire, MD  Admit date: 01/31/2018 Discharge date: 02/03/2018  Time spent: 45 minutes  Recommendations for Outpatient Follow-up:  -To be discharged home today. -Needs to complete 5 more days of clindamycin for aspiration pneumonia, would recommend repeat chest x-ray in 4 to 6 weeks to ensure complete resolution of pneumonia.  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 Manuella Ghazi on 10/14:  [name] is a 84 y.o. male with medical history significant for atrial fibrillation on Coumadin, hypertension, diabetes, dementia, and non-Hodgkin's lymphoma who was brought to the emergency department today after he was found down earlier in the morning in his bedroom after a fall from his bed.  His wife was downstairs in the basement cleaning and she found him this way with some blood surrounding him.  She states that the patient has been more confused in the last several days and has had difficulty with interactions with family members and could not clearly follow the football game he was watching yesterday.  Patient is noted to have some dementia that has been worsening over the last month and continues to take his medications including Coumadin for atrial fibrillation.   ED Course: Vital signs are stable and laboratory data reveals WBC count of 11.2 and potassium of 3.1 along with  glucose of 202.  He is noted to have multiple imaging studies with CT of the head and CTA of head and neck which are negative for any acute findings.  His chest x-ray does demonstrate some left lower lobe pneumonia and he has been started on Rocephin as a result.  Tele-neurology consultation does not recommend TPA at this time, but does recommend rule out of metabolic causes such as I43, folate, ammonia, and thyroid issues.  Consider EEG and neurology consultation.  Hospital Course:   Acute metabolic encephalopathy -Suspect in large part due to aspiration pneumonia. -Medications might also be playing a role. -MRI and EEG have been nondiagnostic, neurology is following for recommendations.  They have recommended reducing his psychotropic medications including Remeron, amitriptyline and Benadryl. -He has had a functional deterioration of his cognition and ambulation in the last few months likely a function of aging and repeated hospitalizations.  Aspiration pneumonia -Seen by speech therapy with recommendation for a dysphagia 3 diet with thin liquids and aspiration precautions. -Patient has a penicillin allergy, as such Unasyn/Augmentin is not recommended, was on Rocephin and Flagyl while hospitalized and will be transitioned to clindamycin on discharge to complete 5 more days of treatment.  Atrial fibrillation, permanent -Has remained rate controlled, anticoagulated on Coumadin.  Stage IV prostate cancer -With bone mets. -Continue follow-up with oncology.  Diabetes -Fair control, continue current management.  Hypertension -BP has been well controlled, continue home medications.  Hypokalemia/hypomagnesemia -Replaced on 10/16.  Procedures:  None  Consultations:  Neurology  Discharge Instructions  Discharge Instructions    Diet - low  sodium heart healthy   Complete by:  As directed    Increase activity slowly   Complete by:  As directed      Allergies as of 02/03/2018       Reactions   Augmentin [amoxicillin-pot Clavulanate] Rash   Redness of skin      Medication List    STOP taking these medications   amitriptyline 25 MG tablet Commonly known as:  ELAVIL   apixaban 5 MG Tabs tablet Commonly known as:  ELIQUIS   cyanocobalamin 1000 MCG/ML injection Commonly known as:  (VITAMIN B-12)   diphenhydrAMINE 25 MG tablet Commonly known as:  BENADRYL   fluconazole 100 MG tablet Commonly known as:  DIFLUCAN   mirtazapine 15 MG tablet Commonly known as:  REMERON     TAKE these medications   abiraterone acetate 250 MG tablet Commonly known as:  ZYTIGA Take 4 tablets (1,000 mg total) by mouth daily. Take on an empty stomach 1 hour before or 2 hours after a meal   acetaminophen 500 MG tablet Commonly known as:  TYLENOL Take 500-1,000 mg by mouth daily as needed for mild pain, fever or headache.   CALCIUM CARB-CHOLECALCIFEROL PO Take 1 tablet by mouth daily.   clindamycin 300 MG capsule Commonly known as:  CLEOCIN Take 1 capsule (300 mg total) by mouth 3 (three) times daily for 5 days.   clotrimazole-betamethasone cream Commonly known as:  LOTRISONE Apply 1 application topically daily as needed (for fungus).   CRANBERRY PO Take 4,200 mg by mouth daily.   denosumab 120 MG/1.7ML Soln injection Commonly known as:  XGEVA Inject 120 mg into the skin every 30 (thirty) days.   diltiazem 90 MG tablet Commonly known as:  CARDIZEM TAKE 1 TABLET BY MOUTH TWICE DAILY -- **NEEDS APPOINTMENT FOR FURTHER REFILLS** What changed:  Another medication with the same name was removed. Continue taking this medication, and follow the directions you see here.   docusate sodium 100 MG capsule Commonly known as:  COLACE Take 100 mg by mouth daily as needed for mild constipation.   furosemide 20 MG tablet Commonly known as:  LASIX Take 1 tablet (20 mg total) by mouth daily.   glipiZIDE 5 MG 24 hr tablet Commonly known as:  GLUCOTROL XL Take 5 mg by  mouth daily with breakfast.   guaiFENesin 600 MG 12 hr tablet Commonly known as:  MUCINEX Take 600 mg by mouth as needed.   lisinopril 20 MG tablet Commonly known as:  PRINIVIL,ZESTRIL Take 1 tablet (20 mg total) daily by mouth.   loperamide 2 MG capsule Commonly known as:  IMODIUM Take 2 mg by mouth as needed for diarrhea or loose stools.   LUPRON DEPOT (43-MONTH) 45 MG injection Generic drug:  Leuprolide Acetate (6 Month) Inject 45 mg into the muscle every 6 (six) months.   metFORMIN 850 MG tablet Commonly known as:  GLUCOPHAGE Take 500 mg by mouth 2 (two) times daily with a meal.   nystatin powder Commonly known as:  MYCOSTATIN/NYSTOP Apply 1 g topically 4 (four) times daily as needed (fungus).   omeprazole 40 MG capsule Commonly known as:  PRILOSEC Take 40 mg by mouth daily.   phenylephrine-shark liver oil-mineral oil-petrolatum 0.25-3-14-71.9 % rectal ointment Commonly known as:  PREPARATION H Place 1 application rectally 2 (two) times daily as needed (for chaffing).   potassium chloride 10 MEQ tablet Commonly known as:  K-DUR,KLOR-CON Take 2 tablets (20 mEq total) by mouth daily.   predniSONE 5 MG  tablet Commonly known as:  DELTASONE Take 0.5 tablets (2.5 mg total) by mouth 2 (two) times daily with a meal.   sennosides-docusate sodium 8.6-50 MG tablet Commonly known as:  SENOKOT-S Take 1 tablet by mouth daily as needed for constipation.   VENTOLIN HFA 108 (90 Base) MCG/ACT inhaler Generic drug:  albuterol Inhale 1-2 puffs into the lungs every 6 (six) hours as needed for wheezing or shortness of breath.   warfarin 2.5 MG tablet Commonly known as:  COUMADIN Take as directed. If you are unsure how to take this medication, talk to your nurse or doctor. Original instructions:  Take 2 - 3 tablets daily as directed by the anticoagulation clinic      Allergies  Allergen Reactions  . Augmentin [Amoxicillin-Pot Clavulanate] Rash    Redness of skin   Follow-up  Information    Rosita Fire, MD. Schedule an appointment as soon as possible for a visit in 2 week(s).   Specialty:  Internal Medicine Contact information: [address] [address] [phone]            The results of significant diagnostics from this hospitalization (including imaging, microbiology, ancillary and laboratory) are listed below for reference.    Significant Diagnostic Studies: Ct Angio Head W Or Wo Contrast  Result Date: 01/31/2018 CLINICAL DATA:  Right-sided neglect. Speech disturbance. History of fall. No acute finding on previous head CT. EXAM: CT ANGIOGRAPHY HEAD AND NECK TECHNIQUE: Multidetector CT imaging of the head and neck was performed using the standard protocol during bolus administration of intravenous contrast. Multiplanar CT image reconstructions and MIPs were obtained to evaluate the vascular anatomy. Carotid stenosis measurements (when applicable) are obtained utilizing NASCET criteria, using the distal internal carotid diameter as the denominator. CONTRAST:  39mL ISOVUE-370 IOPAMIDOL (ISOVUE-370) INJECTION 76% COMPARISON:  CT earlier same day. FINDINGS: CTA NECK FINDINGS Aortic arch: Aortic atherosclerosis. No aneurysm or dissection. 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 No origin stenoses identified. Right carotid system: Common carotid artery widely patent to the bifurcation region. Atherosclerotic calcification at the carotid bifurcation. Minimal proximal ICA diameter 5.8 mm, therefore no stenosis. Cervical ICA widely patent beyond that. Left carotid system: Common carotid artery widely patent to the bifurcation. Calcified plaque at the carotid bifurcation and ICA bulb. Minimal ICA diameter 6 mm, therefore no stenosis. Cervical ICA widely patent beyond that. Vertebral arteries: Right vertebral artery is dominant with a normal origin. Left vertebral artery  originates from the arch as noted above. Both vessels are widely patent at their origins and through the cervical region to the foramen magnum. Skeleton: Ordinary mid cervical spondylosis. Other neck: No mass or lymphadenopathy. Upper chest: Mild emphysema and scarring.  No active process. Review of the MIP images confirms the above findings CTA HEAD FINDINGS Anterior circulation: Both internal carotid arteries are patent through the skull base and siphon regions. There is ordinary peripheral calcification in the carotid siphon regions but no stenosis. The anterior and middle cerebral vessels are patent without proximal stenosis, aneurysm or vascular malformation. No missing branch vessels are identified. Posterior circulation: Left vertebral artery terminates in PICA. Right vertebral artery supplies the basilar. No basilar stenosis. Posterior circulation branch vessels are normal. Fetal origin right PCA. Venous sinuses: Patent and normal. Anatomic variants: None significant. Delayed phase: No abnormal enhancement. Review of the MIP images confirms the above findings IMPRESSION: No large or medium vessel occlusion identified. Nonstenotic atherosclerotic disease of  the aortic arch, carotid bifurcations and carotid siphon regions. These results were called by telephone at the time of interpretation on 01/31/2018 at 2:12 pm to Dr. Davonna Belling , who verbally acknowledged these results. Electronically Signed   By: Nelson Chimes M.D.   On: 01/31/2018 14:16   Dg Chest 2 View  Result Date: 01/31/2018 CLINICAL DATA:  84 year old male with a history of fall EXAM: CHEST - 2 VIEW COMPARISON:  12/30/2017, 11/25/2017 FINDINGS: Cardiomediastinal silhouette unchanged in size and contour. Reticular opacity of the left lower lobe partially obscuring left hemidiaphragm in the left heart border, on the frontal and lateral views. No pleural effusion. No pneumothorax. No significant interlobular septal thickening. No displaced  fracture. IMPRESSION: Airspace opacity of the left lower lobe concerning for infection. Electronically Signed   By: Corrie Mckusick D.O.   On: 01/31/2018 13:46   Dg Forearm Right  Result Date: 01/31/2018 CLINICAL DATA:  Right forearm swelling after fall today. EXAM: RIGHT FOREARM - 2 VIEW COMPARISON:  None. FINDINGS: There is no evidence of fracture or other focal bone lesions. Vascular calcifications are noted. Otherwise soft tissues are unremarkable. IMPRESSION: No acute abnormality seen in the right forearm. Electronically Signed   By: Marijo Conception, M.D.   On: 01/31/2018 13:46   Dg Wrist Complete Right  Result Date: 01/31/2018 CLINICAL DATA:  Right wrist swelling after fall today. EXAM: RIGHT WRIST - COMPLETE 3+ VIEW COMPARISON:  None. FINDINGS: There is no evidence of fracture or dislocation. There is no evidence of arthropathy or other focal bone abnormality. Soft tissues are unremarkable. IMPRESSION: Negative. Electronically Signed   By: Marijo Conception, M.D.   On: 01/31/2018 13:47   Ct Head Wo Contrast  Result Date: 01/31/2018 CLINICAL DATA:  84 year old male post fall.  Initial encounter. EXAM: CT HEAD WITHOUT CONTRAST CT CERVICAL SPINE WITHOUT CONTRAST TECHNIQUE: Multidetector CT imaging of the head and cervical spine was performed following the standard protocol without intravenous contrast. Multiplanar CT image reconstructions of the cervical spine were also generated. COMPARISON:  09/29/2017 head CT.  No comparison cervical spine CT. FINDINGS: CT HEAD FINDINGS Brain: No intracranial hemorrhage or CT evidence of large acute infarct. Chronic microvascular changes. Global atrophy. No intracranial mass lesion noted on this unenhanced exam. Vascular: Vascular calcifications.  No hyperdense vessel. Skull: No skull fracture. Sinuses/Orbits: Post lens replacement. No acute orbital abnormality. Visualized paranasal sinuses clear. Other: Partial opacification right mastoid air cells without  obstructing lesion of eustachian tube. This was noted previously. Soft tissue swelling right occipital region. CT CERVICAL SPINE FINDINGS Alignment: Minimal curvature. Skull base and vertebrae: No cervical spine fracture. Soft tissues and spinal canal: No abnormal prevertebral soft tissue swelling. Disc levels: Cervical spondylotic changes most notable C5-6 with mild spinal stenosis and slight cord flattening C4-5 through C6-7. Upper chest: No worrisome lung mass. Other: Carotid bifurcation calcifications. IMPRESSION: 1. No skull fracture or intracranial hemorrhage. 2. No cervical spine fracture or abnormal prevertebral soft tissue swelling. 3. Chronic changes as detailed above. Electronically Signed   By: Genia Del M.D.   On: 01/31/2018 11:56   Ct Angio Neck W And/or Wo Contrast  Result Date: 01/31/2018 CLINICAL DATA:  Right-sided neglect. Speech disturbance. History of fall. No acute finding on previous head CT. EXAM: CT ANGIOGRAPHY HEAD AND NECK TECHNIQUE: Multidetector CT imaging of the head and neck was performed using the standard protocol during bolus administration of intravenous contrast. Multiplanar CT image reconstructions and MIPs were obtained to evaluate the vascular anatomy.  Carotid stenosis measurements (when applicable) are obtained utilizing NASCET criteria, using the distal internal carotid diameter as the denominator. CONTRAST:  79mL ISOVUE-370 IOPAMIDOL (ISOVUE-370) INJECTION 76% COMPARISON:  CT earlier same day. FINDINGS: CTA NECK FINDINGS Aortic arch: Aortic atherosclerosis. No aneurysm or dissection. 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 No origin stenoses identified. Right carotid system: Common carotid artery widely patent to the bifurcation region. Atherosclerotic calcification at the carotid bifurcation. Minimal proximal ICA diameter 5.8 mm, therefore no stenosis. Cervical ICA widely patent beyond  that. Left carotid system: Common carotid artery widely patent to the bifurcation. Calcified plaque at the carotid bifurcation and ICA bulb. Minimal ICA diameter 6 mm, therefore no stenosis. Cervical ICA widely patent beyond that. Vertebral arteries: Right vertebral artery is dominant with a normal origin. Left vertebral artery originates from the arch as noted above. Both vessels are widely patent at their origins and through the cervical region to the foramen magnum. Skeleton: Ordinary mid cervical spondylosis. Other neck: No mass or lymphadenopathy. Upper chest: Mild emphysema and scarring.  No active process. Review of the MIP images confirms the above findings CTA HEAD FINDINGS Anterior circulation: Both internal carotid arteries are patent through the skull base and siphon regions. There is ordinary peripheral calcification in the carotid siphon regions but no stenosis. The anterior and middle cerebral vessels are patent without proximal stenosis, aneurysm or vascular malformation. No missing branch vessels are identified. Posterior circulation: Left vertebral artery terminates in PICA. Right vertebral artery supplies the basilar. No basilar stenosis. Posterior circulation branch vessels are normal. Fetal origin right PCA. Venous sinuses: Patent and normal. Anatomic variants: None significant. Delayed phase: No abnormal enhancement. Review of the MIP images confirms the above findings IMPRESSION: No large or medium vessel occlusion identified. Nonstenotic atherosclerotic disease of the aortic arch, carotid bifurcations and carotid siphon regions. These results were called by telephone at the time of interpretation on 01/31/2018 at 2:12 pm to Dr. Davonna Belling , who verbally acknowledged these results. Electronically Signed   By: Nelson Chimes M.D.   On: 01/31/2018 14:16   Ct Cervical Spine Wo Contrast  Result Date: 01/31/2018 CLINICAL DATA:  84 year old male post fall.  Initial encounter. EXAM: CT HEAD  WITHOUT CONTRAST CT CERVICAL SPINE WITHOUT CONTRAST TECHNIQUE: Multidetector CT imaging of the head and cervical spine was performed following the standard protocol without intravenous contrast. Multiplanar CT image reconstructions of the cervical spine were also generated. COMPARISON:  09/29/2017 head CT.  No comparison cervical spine CT. FINDINGS: CT HEAD FINDINGS Brain: No intracranial hemorrhage or CT evidence of large acute infarct. Chronic microvascular changes. Global atrophy. No intracranial mass lesion noted on this unenhanced exam. Vascular: Vascular calcifications.  No hyperdense vessel. Skull: No skull fracture. Sinuses/Orbits: Post lens replacement. No acute orbital abnormality. Visualized paranasal sinuses clear. Other: Partial opacification right mastoid air cells without obstructing lesion of eustachian tube. This was noted previously. Soft tissue swelling right occipital region. CT CERVICAL SPINE FINDINGS Alignment: Minimal curvature. Skull base and vertebrae: No cervical spine fracture. Soft tissues and spinal canal: No abnormal prevertebral soft tissue swelling. Disc levels: Cervical spondylotic changes most notable C5-6 with mild spinal stenosis and slight cord flattening C4-5 through C6-7. Upper chest: No worrisome lung mass. Other: Carotid bifurcation calcifications. IMPRESSION: 1. No skull fracture or intracranial hemorrhage. 2. No cervical spine fracture or abnormal prevertebral soft tissue swelling. 3. Chronic changes as detailed above. Electronically Signed   By: Remo Lipps  Jeannine Kitten M.D.   On: 01/31/2018 11:56   Mr Brain Wo Contrast  Result Date: 02/01/2018 CLINICAL DATA:  Altered mental status for 2 days EXAM: MRI HEAD WITHOUT CONTRAST TECHNIQUE: Multiplanar, multiecho pulse sequences of the brain and surrounding structures were obtained without intravenous contrast. COMPARISON:  CTA head neck from yesterday FINDINGS: Significantly motion degraded study. Only diffusion and sagittal T1  weighted imaging could be acquired. There is no evidence of infarct, hydrocephalus, or shift. Generalized atrophy. IMPRESSION: 1. Significantly motion degraded and truncated study. No acute finding. 2. Generalized atrophy. Electronically Signed   By: Monte Fantasia M.D.   On: 02/01/2018 10:40    Microbiology: No results found for this or any previous visit (from the past 240 hour(s)).   Labs: Basic Metabolic Panel: Recent Labs  Lab 01/31/18 1144 02/01/18 0439 02/02/18 0501 02/03/18 0514  NA 137 139 139 138  K 3.1* 3.2* 4.2 4.8  CL 100 107 106 109  CO2 27 25 25 23   GLUCOSE 202* 88 145* 139*  BUN 15 11 11 13   CREATININE 0.76 0.66 0.75 0.72  CALCIUM 9.7 8.6* 8.6* 8.0*  MG  --  1.4* 2.5*  --    Liver Function Tests: Recent Labs  Lab 01/31/18 1144  AST 20  ALT 15  ALKPHOS 72  BILITOT 1.2  PROT 7.2  ALBUMIN 4.1   No results for input(s): LIPASE, AMYLASE in the last 168 hours. Recent Labs  Lab 01/31/18 1719  AMMONIA 23   CBC: Recent Labs  Lab 01/31/18 1144 02/01/18 0439 02/02/18 0501 02/03/18 0514  WBC 11.2* 7.6 7.7 6.1  NEUTROABS 7.4  --   --   --   HGB 14.4 12.7* 12.2* 10.9*  HCT 40.9 37.7* 36.8* 33.6*  MCV 88.1 88.1 92.5 91.6  PLT 176 128* 164 132*   Cardiac Enzymes: Recent Labs  Lab 01/31/18 1217  CKTOTAL 39*   BNP: BNP (last 3 results) Recent Labs    03/16/17 1925  BNP 132.0*    ProBNP (last 3 results) No results for input(s): PROBNP in the last 8760 hours.  CBG: Recent Labs  Lab 02/02/18 1631 02/02/18 1822 02/03/18 0022 02/03/18 0647 02/03/18 1136  GLUCAP 162* 170* 117* 104* 153*       Signed:  [facility] Pager: [phone] 02/03/2018, 3:19 PM

## 2018-02-04 ENCOUNTER — Other Ambulatory Visit: Payer: Self-pay | Admitting: *Deleted

## 2018-02-04 DIAGNOSIS — C61 Malignant neoplasm of prostate: Secondary | ICD-10-CM | POA: Diagnosis not present

## 2018-02-04 DIAGNOSIS — J189 Pneumonia, unspecified organism: Secondary | ICD-10-CM | POA: Diagnosis not present

## 2018-02-04 DIAGNOSIS — I70203 Unspecified atherosclerosis of native arteries of extremities, bilateral legs: Secondary | ICD-10-CM | POA: Diagnosis not present

## 2018-02-04 DIAGNOSIS — E1151 Type 2 diabetes mellitus with diabetic peripheral angiopathy without gangrene: Secondary | ICD-10-CM | POA: Diagnosis not present

## 2018-02-04 DIAGNOSIS — T148XXA Other injury of unspecified body region, initial encounter: Secondary | ICD-10-CM | POA: Diagnosis not present

## 2018-02-04 DIAGNOSIS — L89152 Pressure ulcer of sacral region, stage 2: Secondary | ICD-10-CM | POA: Insufficient documentation

## 2018-02-04 DIAGNOSIS — C7951 Secondary malignant neoplasm of bone: Secondary | ICD-10-CM | POA: Diagnosis not present

## 2018-02-04 DIAGNOSIS — S90129A Contusion of unspecified lesser toe(s) without damage to nail, initial encounter: Secondary | ICD-10-CM | POA: Diagnosis not present

## 2018-02-04 DIAGNOSIS — C8590 Non-Hodgkin lymphoma, unspecified, unspecified site: Secondary | ICD-10-CM | POA: Diagnosis not present

## 2018-02-04 NOTE — Patient Outreach (Signed)
Referral received from inpatient hospital case manager, pt hospitalized 10/14-10/17/19 after falling at home, aspiration pneumonia,  pt diagnoses include prostate cancer stage 4 with mets, non hodgkins lymphoma, A-fib, PAD with history amputation great toe.  Spoke with pt and permission given to speak with daughter [name], HIPAA verified, [name] states pt has 3 daughters who assist pt, [name] is very cautious about RN CM calling on the telephone and not knowing about [facility] care management, not wanting to give out information over the phone,  [name] did answer some questions but did not feel comfortable discussing medications on the phone, [name] states she will talk with her parents over the weekend about [facility] program and "make sure they really want this and I will let you know for sure"  [name] states [facility] home health is seeing pt,  RN CM did discuss discharge summary and importance of primary care post hospital follow up visit, CXR in 4-6 weeks and continuing clindamycin for aspiration pneumonia,  RN CM unable to care plan today due to limited conversation and daughter not sure if pt will be open to [facility] program.  RN CM offered to complete home visit beginning next week and daughter wants to wait and talk with her parents first.  RN CM ask [name] to call primary care MD as they can also explain [facility] care management services.  RN CM gave 24 hour nurse line number.  PLAN Call pt/ daughter beginning of week  Jacqlyn Larsen [facility], [place] Coordinator [phone]

## 2018-02-07 ENCOUNTER — Ambulatory Visit (INDEPENDENT_AMBULATORY_CARE_PROVIDER_SITE_OTHER): Payer: Medicare Other | Admitting: *Deleted

## 2018-02-07 ENCOUNTER — Other Ambulatory Visit: Payer: Self-pay | Admitting: *Deleted

## 2018-02-07 DIAGNOSIS — Z5181 Encounter for therapeutic drug level monitoring: Secondary | ICD-10-CM

## 2018-02-07 DIAGNOSIS — I4891 Unspecified atrial fibrillation: Secondary | ICD-10-CM

## 2018-02-07 DIAGNOSIS — I70203 Unspecified atherosclerosis of native arteries of extremities, bilateral legs: Secondary | ICD-10-CM | POA: Diagnosis not present

## 2018-02-07 DIAGNOSIS — E1151 Type 2 diabetes mellitus with diabetic peripheral angiopathy without gangrene: Secondary | ICD-10-CM | POA: Diagnosis not present

## 2018-02-07 DIAGNOSIS — C61 Malignant neoplasm of prostate: Secondary | ICD-10-CM | POA: Diagnosis not present

## 2018-02-07 DIAGNOSIS — C8590 Non-Hodgkin lymphoma, unspecified, unspecified site: Secondary | ICD-10-CM | POA: Diagnosis not present

## 2018-02-07 DIAGNOSIS — C7951 Secondary malignant neoplasm of bone: Secondary | ICD-10-CM | POA: Diagnosis not present

## 2018-02-07 DIAGNOSIS — J189 Pneumonia, unspecified organism: Secondary | ICD-10-CM | POA: Diagnosis not present

## 2018-02-07 LAB — POCT INR: INR: 4.1 — AB (ref 2.0–3.0)

## 2018-02-07 NOTE — Patient Instructions (Signed)
Hold coumadin tonight, take 5mg  Tuesday and Wednesday and recheck INR on Thursday 02/10/18 Continue Ensure 2 cans daily Started a Multivitamin without Vit K Order given to [employer]

## 2018-02-07 NOTE — Patient Outreach (Signed)
Telephone call to patient's daughter [name], HIPAA verified, [name] states her father [name] does want to participate in [facility] program and would like a home visit,  RN CM scheduled initial home visit for this week.  PLAN See pt for initial home visit this week  Jacqlyn Larsen [facility], [place] Coordinator [phone]

## 2018-02-08 ENCOUNTER — Encounter (HOSPITAL_COMMUNITY): Payer: Self-pay | Admitting: Emergency Medicine

## 2018-02-08 ENCOUNTER — Emergency Department (HOSPITAL_COMMUNITY)
Admission: EM | Admit: 2018-02-08 | Discharge: 2018-02-08 | Disposition: A | Discharge status: Home / Self Care | Payer: Medicare Other | Attending: Emergency Medicine | Admitting: Emergency Medicine

## 2018-02-08 ENCOUNTER — Emergency Department (HOSPITAL_COMMUNITY): Payer: Medicare Other

## 2018-02-08 ENCOUNTER — Other Ambulatory Visit: Payer: Self-pay

## 2018-02-08 DIAGNOSIS — R0789 Other chest pain: Secondary | ICD-10-CM | POA: Diagnosis not present

## 2018-02-08 DIAGNOSIS — E1151 Type 2 diabetes mellitus with diabetic peripheral angiopathy without gangrene: Secondary | ICD-10-CM | POA: Diagnosis not present

## 2018-02-08 DIAGNOSIS — R079 Chest pain, unspecified: Secondary | ICD-10-CM | POA: Diagnosis not present

## 2018-02-08 DIAGNOSIS — Z8572 Personal history of non-Hodgkin lymphomas: Secondary | ICD-10-CM | POA: Diagnosis not present

## 2018-02-08 DIAGNOSIS — Z89412 Acquired absence of left great toe: Secondary | ICD-10-CM | POA: Diagnosis not present

## 2018-02-08 DIAGNOSIS — I70203 Unspecified atherosclerosis of native arteries of extremities, bilateral legs: Secondary | ICD-10-CM | POA: Diagnosis not present

## 2018-02-08 DIAGNOSIS — J189 Pneumonia, unspecified organism: Secondary | ICD-10-CM | POA: Diagnosis not present

## 2018-02-08 DIAGNOSIS — C61 Malignant neoplasm of prostate: Secondary | ICD-10-CM | POA: Diagnosis not present

## 2018-02-08 DIAGNOSIS — Z85828 Personal history of other malignant neoplasm of skin: Secondary | ICD-10-CM | POA: Diagnosis not present

## 2018-02-08 DIAGNOSIS — I10 Essential (primary) hypertension: Secondary | ICD-10-CM | POA: Diagnosis not present

## 2018-02-08 DIAGNOSIS — Z7984 Long term (current) use of oral hypoglycemic drugs: Secondary | ICD-10-CM | POA: Insufficient documentation

## 2018-02-08 DIAGNOSIS — C7951 Secondary malignant neoplasm of bone: Secondary | ICD-10-CM | POA: Diagnosis not present

## 2018-02-08 DIAGNOSIS — Z79899 Other long term (current) drug therapy: Secondary | ICD-10-CM | POA: Insufficient documentation

## 2018-02-08 DIAGNOSIS — E119 Type 2 diabetes mellitus without complications: Secondary | ICD-10-CM | POA: Insufficient documentation

## 2018-02-08 DIAGNOSIS — Z7901 Long term (current) use of anticoagulants: Secondary | ICD-10-CM | POA: Insufficient documentation

## 2018-02-08 DIAGNOSIS — C8590 Non-Hodgkin lymphoma, unspecified, unspecified site: Secondary | ICD-10-CM | POA: Diagnosis not present

## 2018-02-08 LAB — CBC
HCT: 38.2 % — ABNORMAL LOW (ref 39.0–52.0)
Hemoglobin: 12.6 g/dL — ABNORMAL LOW (ref 13.0–17.0)
MCH: 29.2 pg (ref 26.0–34.0)
MCHC: 33 g/dL (ref 30.0–36.0)
MCV: 88.6 fL (ref 80.0–100.0)
Platelets: 230 10*3/uL (ref 150–400)
RBC: 4.31 MIL/uL (ref 4.22–5.81)
RDW: 13.3 % (ref 11.5–15.5)
WBC: 6.8 10*3/uL (ref 4.0–10.5)
nRBC: 0 % (ref 0.0–0.2)

## 2018-02-08 LAB — HEPATIC FUNCTION PANEL
ALT: 16 U/L (ref 0–44)
AST: 22 U/L (ref 15–41)
Albumin: 3.6 g/dL (ref 3.5–5.0)
Alkaline Phosphatase: 66 U/L (ref 38–126)
Bilirubin, Direct: 0.2 mg/dL (ref 0.0–0.2)
Indirect Bilirubin: 0.7 mg/dL (ref 0.3–0.9)
Total Bilirubin: 0.9 mg/dL (ref 0.3–1.2)
Total Protein: 6.8 g/dL (ref 6.5–8.1)

## 2018-02-08 LAB — BASIC METABOLIC PANEL
Anion gap: 10 (ref 5–15)
BUN: 13 mg/dL (ref 8–23)
CO2: 25 mmol/L (ref 22–32)
Calcium: 9.5 mg/dL (ref 8.9–10.3)
Chloride: 104 mmol/L (ref 98–111)
Creatinine, Ser: 0.79 mg/dL (ref 0.61–1.24)
GFR calc Af Amer: >60 mL/min (ref >60)
GFR calc non Af Amer: >60 mL/min (ref >60)
Glucose, Bld: 114 mg/dL — ABNORMAL HIGH (ref 70–99)
Potassium: 3.6 mmol/L (ref 3.5–5.1)
Sodium: 139 mmol/L (ref 135–145)

## 2018-02-08 LAB — DIFFERENTIAL
Band Neutrophils: 0 %
Basophils Absolute: 0 10*3/uL (ref 0.0–0.1)
Basophils Relative: 0 %
Blasts: 0 %
Eosinophils Absolute: 0.1 10*3/uL (ref 0.0–0.5)
Eosinophils Relative: 1 %
Lymphocytes Relative: 26 %
Lymphs Abs: 1.8 10*3/uL (ref 0.7–4.0)
Metamyelocytes Relative: 0 %
Monocytes Absolute: 1 10*3/uL (ref 0.1–1.0)
Monocytes Relative: 14 %
Myelocytes: 0 %
Neutro Abs: 3.9 10*3/uL (ref 1.7–7.7)
Neutrophils Relative %: 59 %
Other: 0 %
Promyelocytes Relative: 0 %
nRBC: 0 /100 WBC

## 2018-02-08 LAB — PROTIME-INR
INR: 2.38
Prothrombin Time: 25.7 seconds — ABNORMAL HIGH (ref 11.4–15.2)

## 2018-02-08 LAB — TROPONIN I
Troponin I: <0.03 ng/mL (ref <0.03)
Troponin I: <0.03 ng/mL (ref <0.03)

## 2018-02-08 NOTE — ED Provider Notes (Signed)
[facility] EMERGENCY DEPARTMENT Provider Note   CSN: [id number] Arrival date & time: 02/08/18  1558     History   Chief Complaint Chief Complaint  Patient presents with  . Chest Pain    HPI [name] is a 84 y.o. male.  Patient states that he had some chest discomfort off and on today.  He no longer has the pain now patient did not have shortness of breath or sweating.  His doctor told him to come and get checked at the hospital  The history is provided by the patient. No language interpreter was used.  Chest Pain   This is a new problem. The current episode started less than 1 hour ago. The problem occurs rarely. The problem has been resolved. The pain is associated with rest. The pain is present in the substernal region. The pain is at a severity of 3/10. The pain is mild. The quality of the pain is described as brief. The pain does not radiate. Exacerbated by: Unknown. Pertinent negatives include no abdominal pain, no back pain, no cough and no headaches. He has tried nothing for the symptoms.  Pertinent negatives for past medical history include no seizures.    Past Medical History:  Diagnosis Date  . Acute bronchitis   . Arthritis   . Atrial fibrillation ([place])    Dr. Harl Bowie- [place] follows saw 11'14  . Bladder stones    tx. with oral meds and antibiotics, now surgery planned  . Cataracts, both eyes    surgery planned May 2015  . Diabetes mellitus without complication ([place])    Type II  . Dyspnea    with activity  . Dysrhythmia    Afib  . Family history of breast cancer   . Family history of colon cancer   . GERD (gastroesophageal reflux disease)   . 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 Margot Chimes, thus far without  recurrence.   . Hypertension   . NHL (non-Hodgkin's lymphoma) ([place]) 2005   Diffuse large B-cell lymphoma, clinically stage IIIA, CD20 positive, status post cervical lymph node biopsy 08/03/2003 on the left. PET scan was also positive in the spleen and small bowel region, but bone marrow aspiration and biopsy were negative. So he essentially had stage IIIAs. He received R-CHOP x6 cycles with CR established by PET scan criteria on 11/23/2003 with no evidence for relapse th  . PAD (peripheral artery disease) ([place]) 08/27/2017  . Skin cancer    Basal cell- face,head, neck,  arms, legs, Back    Patient Active Problem List   Diagnosis Date Noted  . Encephalopathy acute 01/31/2018  . Aspiration pneumonia ([place]) 01/31/2018  . Diarrhea 11/25/2017  . Acute lower UTI 11/25/2017  . PAD (peripheral artery disease) ([place]) 08/27/2017  . Great toe amputation status, left 07/30/2017  . Osteomyelitis of toe of left foot ([place]) 06/29/2017  . Acute bronchitis with bronchospasm 03/16/2017  . Type 2 diabetes mellitus ([place]) 03/16/2017  . Hypomagnesemia 03/16/2017  . 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 Postural dizziness with presyncope 02/19/2017  . Polyneuropathy 02/19/2017  . Numbness 02/19/2017  . Urinary frequency 02/19/2017  . Genetic testing 02/09/2017  . Family history of breast cancer   . Family history of colon cancer   . Other  dysphagia 01/21/2016  . Bone metastases ([place]) 08/21/2015  . Prostate cancer ([place]) 09/08/2013  . Hypertrophy of prostate with urinary obstruction and other lower urinary tract symptoms (LUTS) 08/10/2013  . Encounter for therapeutic drug monitoring 06/12/2013  . NHL (non-Hodgkin's lymphoma) ([place]) 08/18/2011  . GIST (gastrointestinal stromal tumor), malignant ([place]) 08/18/2011  . Long term (current) use of anticoagulants 07/18/2010  . Essential hypertension 10/04/2008  . ATRIAL FIBRILLATION 10/04/2008    Past Surgical History:  Procedure Laterality Date  .  AMPUTATION Left 06/29/2017   Procedure: AMPUTATION LEFT GREAT TOE;  Surgeon: [name], MD;  Location: [facility];  Service: Vascular;  Laterality: Left;  . AMPUTATION Left 08/27/2017   Procedure: AMPUTATION Left SECOND TOE;  Surgeon: [name], MD;  Location: [place] [facility];  Service: Vascular;  Laterality: Left;  . CATARACT EXTRACTION Bilateral 2015  . CHOLECYSTECTOMY    . COLON RESECTION     small bowel  . CYSTOSCOPY WITH LITHOLAPAXY N/A 08/10/2013   Procedure: CYSTOSCOPY WITH LITHOLAPAXY WITH [name];  Surgeon: [name], MD;  [facility];  Service: Urology;  Laterality: N/A;  . ESOPHAGEAL DILATION N/A 02/27/2016   Procedure: ESOPHAGEAL DILATION;  Surgeon: [name], MD;  [facility];  Service: Endoscopy;  Laterality: N/A;  . ESOPHAGOGASTRODUODENOSCOPY N/A 02/27/2016   Procedure: ESOPHAGOGASTRODUODENOSCOPY (EGD);  Surgeon: [name], MD;  [facility];  Service: Endoscopy;  Laterality: N/A;  12:00  . INCISION AND DRAINAGE PERIRECTAL ABSCESS    . LOWER EXTREMITY ANGIOGRAPHY N/A 06/25/2017   Procedure: LOWER EXTREMITY ANGIOGRAPHY;  Surgeon: [name], MD;  Location: [facility];  Service: Cardiovascular;  Laterality: N/A;  . LYMPH NODE DISSECTION Left    '05-neck  . PORT-A-CATH REMOVAL    . PORTACATH PLACEMENT     insertion and removal -last chemotherapy 10 yrs ago  . TRANSURETHRAL RESECTION OF PROSTATE N/A 08/10/2013   Procedure: TRANSURETHRAL RESECTION OF THE PROSTATE WITH GYRUS INSTRUMENTS;  Surgeon: [name], MD;  Location: [facility];  Service: Urology;  Laterality: N/A;        Home Medications    Prior to Admission medications   Medication Sig Start Date End Date Taking? Authorizing Provider  abiraterone acetate (ZYTIGA) 250 MG tablet Take 4 tablets (1,000 mg total) by mouth daily. Take on an empty stomach 1 hour before or 2 hours after a meal Patient taking differently: Take 1,000 mg by mouth daily before  breakfast. Take on an empty stomach 1 hour before or 2 hours after a meal 11/26/17  Yes [name], MD  acetaminophen (TYLENOL) 500 MG tablet Take 500-1,000 mg by mouth daily as needed for mild pain, fever or headache.   Yes [provider]  Calcium Carb-Cholecalciferol (CALCIUM 600+D) 600-800 MG-UNIT TABS Take 1 tablet by mouth 2 (two) times daily.   Yes [provider]  Cholecalciferol (VITAMIN D3) 1000 units CAPS Take 1 capsule by mouth every morning.   Yes [provider]  clindamycin (CLEOCIN) 300 MG capsule Take 1 capsule (300 mg total) by mouth 3 (three) times daily for 5 days. Patient taking differently: Take 300 mg by mouth 3 (three) times daily. Course to be completed on 02/09/2018 02/03/18 02/08/18 Yes Isaac Bliss, Rayford Halsted, MD  clotrimazole-betamethasone (LOTRISONE) cream Apply 1 application topically daily as needed (for fungus).  06/18/15  Yes [provider]  Cranberry-Vitamin C-Vitamin E (CRANBERRY PLUS VITAMIN C) 4200-20-3 MG-MG-UNIT CAPS Take 1 capsule by mouth every morning.   Yes  [provider]  denosumab (XGEVA) 120 MG/1.7ML SOLN injection Inject 120 mg into the skin every 30 (thirty) days.    Yes [provider]  diltiazem (CARDIZEM) 90 MG tablet TAKE 1 TABLET BY MOUTH TWICE DAILY -- **NEEDS APPOINTMENT FOR FURTHER REFILLS** Patient taking differently: Take 90 mg by mouth 2 (two) times daily.  12/03/17  Yes BranchAlphonse Guild, MD  docusate sodium (COLACE) 100 MG capsule Take 100 mg by mouth daily as needed for mild constipation.   Yes [provider]  furosemide (LASIX) 20 MG tablet Take 1 tablet (20 mg total) by mouth daily. Patient taking differently: Take 20 mg by mouth every morning.  03/18/17 03/18/18 Yes Kathie Dike, MD  glipiZIDE (GLUCOTROL XL) 5 MG 24 hr tablet Take 5 mg by mouth daily with breakfast.  06/25/15  Yes [provider]  guaiFENesin (MUCINEX) 600 MG 12 hr tablet Take 600 mg  by mouth daily as needed for cough or to loosen phlegm.    Yes [provider]  Leuprolide Acetate, 6 Month, (LUPRON DEPOT) 45 MG injection Inject 45 mg into the muscle every 6 (six) months.   Yes [provider]  lisinopril (PRINIVIL,ZESTRIL) 20 MG tablet Take 1 tablet (20 mg total) daily by mouth. Patient taking differently: Take 20 mg by mouth every morning.  02/25/17  Yes Sater, [name], MD  loperamide (IMODIUM) 2 MG capsule Take 2 mg by mouth daily as needed for diarrhea or loose stools.    Yes [provider]  metFORMIN (GLUCOPHAGE) 850 MG tablet Take 850 mg by mouth 2 (two) times daily with a meal.  03/23/17  Yes [provider]  Multiple Vitamin (MULTIVITAMIN WITH MINERALS) TABS tablet Take 1 tablet by mouth every morning.   Yes [provider]  nystatin (MYCOSTATIN) powder Apply 1 g topically 4 (four) times daily as needed (fungus).    Yes [provider]  omeprazole (PRILOSEC) 40 MG capsule Take 20 mg by mouth every morning.  12/14/17  Yes [provider]  potassium chloride (K-DUR,KLOR-CON) 10 MEQ tablet Take 2 tablets (20 mEq total) by mouth daily. Patient taking differently: Take 10 mEq by mouth 2 (two) times daily.  06/21/17  Yes Higgs, Mathis Dad, MD  predniSONE (DELTASONE) 5 MG tablet Take 0.5 tablets (2.5 mg total) by mouth 2 (two) times daily with a meal. 12/16/17  Yes [name], MD  sennosides-docusate sodium (SENOKOT-S) 8.6-50 MG tablet Take 1 tablet by mouth daily as needed for constipation.    Yes [provider]  VENTOLIN HFA 108 (90 Base) MCG/ACT inhaler Inhale 1-2 puffs into the lungs every 6 (six) hours as needed for wheezing or shortness of breath.  03/08/17  Yes [provider]  warfarin (COUMADIN) 2.5 MG tablet Take 2 - 3 tablets daily as directed by the anticoagulation clinic Patient taking differently: Take 2.5-5 mg by mouth See admin instructions. As directed by Coumadin clinic 12/29/17   Yes Branch, Alphonse Guild, MD    Family History Family History  Problem Relation Age of Onset  . Heart attack Mother        died in her 55s  . Other Father 54       pneumonia - died  . Breast cancer Sister        dx in her 44s-60s  . Colon cancer Brother        dx in his 61s  . Cancer Sister        TAH/BSO due to "male  cancer"  . Breast cancer Daughter 36  . Breast cancer Daughter 67       DCIS - ATM VUS on [employer] 83 gene panel in 2018  . Breast cancer Daughter 16       DCIS - Negative on [employer] MyRisk 25 gene apnel in 2015  . Thyroid cancer Daughter 39       Medullary and Papillary  . Thyroid nodules Grandchild     Social History Social History   Tobacco Use  . Smoking status: Never Smoker  . Smokeless tobacco: Never Used  Substance Use Topics  . Alcohol use: No  . Drug use: No     Allergies   Augmentin [amoxicillin-pot clavulanate]   Review of Systems Review of Systems  Constitutional: Negative for appetite change and fatigue.  HENT: Negative for congestion, ear discharge and sinus pressure.   Eyes: Negative for discharge.  Respiratory: Negative for cough.   Cardiovascular: Positive for chest pain.  Gastrointestinal: Negative for abdominal pain and diarrhea.  Genitourinary: Negative for frequency and hematuria.  Musculoskeletal: Negative for back pain.  Skin: Negative for rash.  Neurological: Negative for seizures and headaches.  Psychiatric/Behavioral: Negative for hallucinations.     Physical Exam Updated Vital Signs BP 127/81   Pulse 82   Temp 97.7 F (36.5 C) (Oral)   Resp (!) 21   Ht 6' (1.829 m)   Wt 68 kg   SpO2 100%   BMI 20.34 kg/m   Physical Exam  Constitutional: He is oriented to person, place, and time. He appears well-developed.  HENT:  Head: Normocephalic.  Eyes: Conjunctivae and EOM are normal. No scleral icterus.  Neck: Neck supple. No thyromegaly present.  Cardiovascular: Normal rate and regular rhythm. Exam reveals no  gallop and no friction rub.  No murmur heard. Pulmonary/Chest: No stridor. He has no wheezes. He has no rales. He exhibits no tenderness.  Abdominal: He exhibits no distension. There is no tenderness. There is no rebound.  Musculoskeletal: Normal range of motion. He exhibits no edema.  Lymphadenopathy:    He has no cervical adenopathy.  Neurological: He is oriented to person, place, and time. He exhibits normal muscle tone. Coordination normal.  Skin: No rash noted. No erythema.  Psychiatric: He has a normal mood and affect. His behavior is normal.     ED Treatments / Results  Labs (all labs ordered are listed, but only abnormal results are displayed) Labs Reviewed  BASIC METABOLIC PANEL - Abnormal; Notable for the following components:      Result Value   Glucose, Bld 114 (*)    All other components within normal limits  CBC - Abnormal; Notable for the following components:   Hemoglobin 12.6 (*)    HCT 38.2 (*)    All other components within normal limits  PROTIME-INR - Abnormal; Notable for the following components:   Prothrombin Time 25.7 (*)    All other components within normal limits  TROPONIN I  HEPATIC FUNCTION PANEL  DIFFERENTIAL  TROPONIN I    EKG None  Radiology Dg Chest 2 View  Result Date: 02/08/2018 CLINICAL DATA:  Central chest pain beginning yesterday. EXAM: CHEST - 2 VIEW COMPARISON:  01/31/2018 and previous FINDINGS: Heart size is normal with some left ventricular prominence. Chronic aortic atherosclerosis. Chronic pulmonary markings at the lung bases without evidence of active consolidation, collapse or effusion. Chronic granuloma in the left lower lung. No acute bone finding. IMPRESSION: Chronic scarring/bronchiectasis in the lower lungs. No active consolidation, collapse  or effusion. Electronically Signed   By: Nelson Chimes M.D.   On: 02/08/2018 16:51    Procedures Procedures (including critical care time)  Medications Ordered in ED Medications -  No data to display   Initial Impression / Assessment and Plan / ED Course  I have reviewed the triage vital signs and the nursing notes.  Pertinent labs & imaging results that were available during my care of the patient were reviewed by me and considered in my medical decision making (see chart for details).     Patient with atypical chest discomfort that has disappeared.  He had 2 troponins that were negative.  Patient presently is on Coumadin for atrial fib.  His Coumadin level was 4.1 yesterday and his Coumadin was held today.  Doubt this pain is related to cardiac.  He will follow back up with his physician or return if his symptoms come back and will not go away  Final Clinical Impressions(s) / ED Diagnoses   Final diagnoses:  Atypical chest pain    ED Discharge Orders    None       Milton Ferguson, MD 02/08/18 2043

## 2018-02-08 NOTE — ED Triage Notes (Signed)
Pt c/o central, non-radiating CP that began yesterday. Pain is intermittent.

## 2018-02-08 NOTE — Discharge Instructions (Addendum)
Follow-up with your doctor if any problems.  Return to the emergency department if pain reoccurs and will not go away

## 2018-02-09 ENCOUNTER — Other Ambulatory Visit: Payer: Self-pay | Admitting: *Deleted

## 2018-02-09 ENCOUNTER — Other Ambulatory Visit (HOSPITAL_COMMUNITY): Payer: Self-pay

## 2018-02-09 ENCOUNTER — Encounter: Payer: Self-pay | Admitting: *Deleted

## 2018-02-09 DIAGNOSIS — C61 Malignant neoplasm of prostate: Secondary | ICD-10-CM

## 2018-02-09 DIAGNOSIS — C7951 Secondary malignant neoplasm of bone: Secondary | ICD-10-CM

## 2018-02-09 NOTE — Patient Outreach (Signed)
[facility]) Care Management   02/09/2018  [name] 1924-01-14 [id number]  [name] is an 84 y.o. male  Subjective: Initial home visit with pt, HIPAA verified, pt resting in recliner, reports being tired, wife present and 2 daughters assisting,  Daughters feel "biggest issue is falling and weakness from cancer"  Daughter [name] has been in touch with insurance company for long term care policy that will pay up to 100$ per day for care and has a cap.  Pt has to meet certain criteria such as 90 days of services from home health, etc before he can start receiving benefits.  Patient's wife feels she has managed pretty well so far but patient's condition has worsened so she is trying to plan for the future with patient's care. Home health is working with pt   Pt to see primary MD 02/15/18 and wife states she is going to request a prescription for light weight wheelchair.  Wife feels blood sugar is not an issue at present as CBG readings are usually in low 100's.  Objective:   Vitals:   02/09/18 1159  BP: 118/60  Pulse: 88  Resp: 16  SpO2: 96%  Weight: 150 lb (68 kg)  Height: 1.753 m (5\' 9" )  CBG yesterday 109 and readings always under 130 per family  ROS  Physical Exam  Constitutional: He appears well-developed.  HENT:  Head: Normocephalic.  Neck: Normal range of motion.  Cardiovascular: Normal rate.  Irregular rhythm  Respiratory: Effort normal and breath sounds normal.  GI: Soft. Bowel sounds are normal.  Musculoskeletal:  Dependent edema LE BL  Skin: Skin is warm and dry.  Psychiatric: He has a normal mood and affect. His behavior is normal. Thought content normal.  forgetful    Encounter Medications:   Outpatient Encounter Medications as of 02/09/2018  Medication Sig Note  . abiraterone acetate (ZYTIGA) 250 MG tablet Take 4 tablets (1,000 mg total) by mouth daily. Take on an empty stomach 1 hour before or 2 hours after a meal (Patient taking  differently: Take 1,000 mg by mouth daily before breakfast. Take on an empty stomach 1 hour before or 2 hours after a meal)   . acetaminophen (TYLENOL) 500 MG tablet Take 500-1,000 mg by mouth daily as needed for mild pain, fever or headache.   . Calcium Carb-Cholecalciferol (CALCIUM 600+D) 600-800 MG-UNIT TABS Take 1 tablet by mouth 2 (two) times daily.   . Cholecalciferol (VITAMIN D3) 1000 units CAPS Take 1 capsule by mouth every morning.   . clotrimazole-betamethasone (LOTRISONE) cream Apply 1 application topically daily as needed (for fungus).    . Cranberry-Vitamin C-Vitamin E (CRANBERRY PLUS VITAMIN C) 4200-20-3 MG-MG-UNIT CAPS Take 1 capsule by mouth every morning.   . denosumab (XGEVA) 120 MG/1.7ML SOLN injection Inject 120 mg into the skin every 30 (thirty) days.  02/08/2018: Due for injection on 02/10/2018 following blood work  . diltiazem (CARDIZEM) 90 MG tablet TAKE 1 TABLET BY MOUTH TWICE DAILY -- **NEEDS APPOINTMENT FOR FURTHER REFILLS** (Patient taking differently: Take 90 mg by mouth 2 (two) times daily. )   . docusate sodium (COLACE) 100 MG capsule Take 100 mg by mouth daily as needed for mild constipation.   . furosemide (LASIX) 20 MG tablet Take 1 tablet (20 mg total) by mouth daily. (Patient taking differently: Take 20 mg by mouth every morning. )   . glipiZIDE (GLUCOTROL XL) 5 MG 24 hr tablet Take 5 mg by mouth daily with breakfast.    .  guaiFENesin (MUCINEX) 600 MG 12 hr tablet Take 600 mg by mouth daily as needed for cough or to loosen phlegm.    Marland Kitchen Leuprolide Acetate, 6 Month, (LUPRON DEPOT) 45 MG injection Inject 45 mg into the muscle every 6 (six) months.   Marland Kitchen lisinopril (PRINIVIL,ZESTRIL) 20 MG tablet Take 1 tablet (20 mg total) daily by mouth. (Patient taking differently: Take 20 mg by mouth every morning. )   . loperamide (IMODIUM) 2 MG capsule Take 2 mg by mouth daily as needed for diarrhea or loose stools.    . metFORMIN (GLUCOPHAGE) 850 MG tablet Take 850 mg by mouth 2  (two) times daily with a meal.    . Multiple Vitamin (MULTIVITAMIN WITH MINERALS) TABS tablet Take 1 tablet by mouth every morning.   . nystatin (MYCOSTATIN) powder Apply 1 g topically 4 (four) times daily as needed (fungus).    Marland Kitchen omeprazole (PRILOSEC) 40 MG capsule Take 20 mg by mouth every morning.  02/08/2018: Per family patient alternates taking 20mg  daily or taking 40mg  every other day. Currently using 20mg  daily  . potassium chloride (K-DUR,KLOR-CON) 10 MEQ tablet Take 2 tablets (20 mEq total) by mouth daily. (Patient taking differently: Take 10 mEq by mouth 2 (two) times daily. )   . predniSONE (DELTASONE) 5 MG tablet Take 0.5 tablets (2.5 mg total) by mouth 2 (two) times daily with a meal.   . sennosides-docusate sodium (SENOKOT-S) 8.6-50 MG tablet Take 1 tablet by mouth daily as needed for constipation.    Marland Kitchen warfarin (COUMADIN) 2.5 MG tablet Take 2 - 3 tablets daily as directed by the anticoagulation clinic (Patient taking differently: Take 2.5-5 mg by mouth See admin instructions. As directed by Coumadin clinic) 02/08/2018: Coumadin levels were checked on 02/07/2018 and were at 4.1. Coumadin was held. Patient is due to take 5mg  on 02/08/2018 and have INR levels rechecked on 02/09/2018  . VENTOLIN HFA 108 (90 Base) MCG/ACT inhaler Inhale 1-2 puffs into the lungs every 6 (six) hours as needed for wheezing or shortness of breath.     No facility-administered encounter medications on file as of 02/09/2018.     Functional Status:   In your present state of health, do you have any difficulty performing the following activities: 02/09/2018 01/31/2018  Hearing? Y Y  Comment has bil hearing aides -  Vision? N N  Difficulty concentrating or making decisions? Y Y  Comment pt has forgetfulness -  Walking or climbing stairs? Y Y  Dressing or bathing? Y Y  Doing errands, shopping? Y N  Preparing Food and eating ? Y -  Using the Toilet? Y -  In the past six months, have you accidently leaked  urine? Y -  Do you have problems with loss of bowel control? N -  Managing your Medications? Y -  Managing your Finances? Y -  Housekeeping or managing your Housekeeping? Y -  Some recent data might be hidden    Fall/Depression Screening:    Fall Risk  02/09/2018 03/13/2014  Falls in the past year? Yes No  Number falls in past yr: 2 or more -  Injury with Fall? Yes -  Risk Factor Category  High Fall Risk -  Risk for fall due to : History of fall(s);Impaired balance/gait;Medication side effect -  Follow up Falls evaluation completed;Falls prevention discussed;Education provided -   PHQ 2/9 Scores 02/09/2018 03/13/2014  PHQ - 2 Score 0 0    Assessment:  RN CM observed and reviewed all medication bottles  with family as they assist pt with medication management.  Pt ate a large breakfast and appetite varies day to day, is drinking nutritional supplements at least 2 per day.  Pt finished clindamycin, right arm is wrapped in bandage due to skin tears.  Pt is attending all appointments, family is very supportive, pt has 2 daughters that live nearby and is checked on daily, family has good understanding of medications and how to care for pt.  Family plans to utilize long term care insurance policy for assistance in the home.  RN CM faxed initial home visit and barrier letter to primary MD Dr. Legrand Rams, noted family would like to discuss prescription for light weight wheelchair at MD visit.  THN CM Care Plan Problem One     Most Recent Value  Care Plan Problem One  Multiple co-morbidities including prostate cancer stage 4 with mets  Role Documenting the Problem One  Care Management Coordinator  Care Plan for Problem One  Active  THN Long Term Goal   Pt, family will verbalize, demonstrate improved self care, understanding to facilitate better outcomes for cancer, multiple co-morbidities within 60 days  THN Long Term Goal Start Date  02/09/18  Interventions for Problem One Long Term Goal  RN CM gave  family THN calendar, 24 hour nurse line, reviewed resources such as home health, RN Care manager and MD to contact as needed, reviewed all upcoming MD appointments, reviewed long term care insurance policy with patient's daughter as she is contact with them as pt is trying to meet requirements.  THN CM Short Term Goal #1   Pt will change positions q 2 hours and walk several times throughout the house daily with assistance within 30 days.  THN CM Short Term Goal #1 Start Date  02/09/18  Interventions for Short Term Goal #1  Pt sits in recliner alot throughout the day, Rn CM ask that family makes sure pt stands up or changes positions q 2 hours as patient's skin is fragile, pt has right arm wrapped in bandages due to mulitple skin tears    THN CM Care Plan Problem Two     Most Recent Value  Care Plan Problem Two  Pt high risk for falls  Role Documenting the Problem Two  Care Management Coordinator  THN CM Short Term Goal #1   Pt, family will verbalize, demonstrate safety precautions within 30 days  THN CM Short Term Goal #1 Start Date  02/09/18  Interventions for Short Term Goal #2   Rn CM reviewed safety precautions, importance of using walker at all times, pt asking for assistance as needed, ask that throw rugs be removed.  THN CM Short Term Goal #2   Pt will work with home health PT and try to complete prescribed exercises within 30 days.  THN CM Short Term Goal #2 Start Date  02/09/18  Interventions for Short Term Goal #2  RN CM reviewed importance of working with PT for increased endurance and muscle strength, ask pt to try and complete exercises, alternating activity with rest      Plan: continue weekly transition of care calls  Jacqlyn Larsen [facility] [place] Coordinator [phone]

## 2018-02-10 ENCOUNTER — Inpatient Hospital Stay (HOSPITAL_COMMUNITY): Payer: Medicare Other | Attending: Hematology

## 2018-02-10 ENCOUNTER — Encounter (HOSPITAL_COMMUNITY): Payer: Self-pay | Admitting: Hematology

## 2018-02-10 ENCOUNTER — Encounter (HOSPITAL_COMMUNITY): Payer: Self-pay

## 2018-02-10 ENCOUNTER — Inpatient Hospital Stay (HOSPITAL_COMMUNITY): Payer: Medicare Other

## 2018-02-10 ENCOUNTER — Other Ambulatory Visit: Payer: Self-pay | Admitting: Cardiology

## 2018-02-10 ENCOUNTER — Inpatient Hospital Stay (HOSPITAL_BASED_OUTPATIENT_CLINIC_OR_DEPARTMENT_OTHER): Payer: Medicare Other | Admitting: Hematology

## 2018-02-10 ENCOUNTER — Other Ambulatory Visit: Payer: Self-pay

## 2018-02-10 ENCOUNTER — Ambulatory Visit (INDEPENDENT_AMBULATORY_CARE_PROVIDER_SITE_OTHER): Payer: Medicare Other | Admitting: *Deleted

## 2018-02-10 ENCOUNTER — Telehealth: Payer: Self-pay | Admitting: *Deleted

## 2018-02-10 VITALS — BP 105/74 | HR 60 | Resp 18

## 2018-02-10 DIAGNOSIS — I4821 Permanent atrial fibrillation: Secondary | ICD-10-CM

## 2018-02-10 DIAGNOSIS — Z7984 Long term (current) use of oral hypoglycemic drugs: Secondary | ICD-10-CM | POA: Diagnosis not present

## 2018-02-10 DIAGNOSIS — E1151 Type 2 diabetes mellitus with diabetic peripheral angiopathy without gangrene: Secondary | ICD-10-CM | POA: Diagnosis not present

## 2018-02-10 DIAGNOSIS — Z9181 History of falling: Secondary | ICD-10-CM | POA: Insufficient documentation

## 2018-02-10 DIAGNOSIS — C61 Malignant neoplasm of prostate: Secondary | ICD-10-CM | POA: Diagnosis not present

## 2018-02-10 DIAGNOSIS — Z803 Family history of malignant neoplasm of breast: Secondary | ICD-10-CM | POA: Insufficient documentation

## 2018-02-10 DIAGNOSIS — K219 Gastro-esophageal reflux disease without esophagitis: Secondary | ICD-10-CM | POA: Diagnosis not present

## 2018-02-10 DIAGNOSIS — M1990 Unspecified osteoarthritis, unspecified site: Secondary | ICD-10-CM | POA: Diagnosis not present

## 2018-02-10 DIAGNOSIS — I10 Essential (primary) hypertension: Secondary | ICD-10-CM

## 2018-02-10 DIAGNOSIS — D472 Monoclonal gammopathy: Secondary | ICD-10-CM | POA: Insufficient documentation

## 2018-02-10 DIAGNOSIS — R531 Weakness: Secondary | ICD-10-CM | POA: Diagnosis not present

## 2018-02-10 DIAGNOSIS — R42 Dizziness and giddiness: Secondary | ICD-10-CM

## 2018-02-10 DIAGNOSIS — Z8701 Personal history of pneumonia (recurrent): Secondary | ICD-10-CM | POA: Insufficient documentation

## 2018-02-10 DIAGNOSIS — C8590 Non-Hodgkin lymphoma, unspecified, unspecified site: Secondary | ICD-10-CM | POA: Diagnosis not present

## 2018-02-10 DIAGNOSIS — Z8509 Personal history of malignant neoplasm of other digestive organs: Secondary | ICD-10-CM | POA: Diagnosis not present

## 2018-02-10 DIAGNOSIS — J189 Pneumonia, unspecified organism: Secondary | ICD-10-CM | POA: Diagnosis not present

## 2018-02-10 DIAGNOSIS — I4891 Unspecified atrial fibrillation: Secondary | ICD-10-CM

## 2018-02-10 DIAGNOSIS — R5383 Other fatigue: Secondary | ICD-10-CM

## 2018-02-10 DIAGNOSIS — E119 Type 2 diabetes mellitus without complications: Secondary | ICD-10-CM | POA: Insufficient documentation

## 2018-02-10 DIAGNOSIS — Z7901 Long term (current) use of anticoagulants: Secondary | ICD-10-CM | POA: Insufficient documentation

## 2018-02-10 DIAGNOSIS — I70203 Unspecified atherosclerosis of native arteries of extremities, bilateral legs: Secondary | ICD-10-CM | POA: Diagnosis not present

## 2018-02-10 DIAGNOSIS — Z79899 Other long term (current) drug therapy: Secondary | ICD-10-CM | POA: Diagnosis not present

## 2018-02-10 DIAGNOSIS — C7951 Secondary malignant neoplasm of bone: Secondary | ICD-10-CM | POA: Insufficient documentation

## 2018-02-10 DIAGNOSIS — Z85828 Personal history of other malignant neoplasm of skin: Secondary | ICD-10-CM | POA: Insufficient documentation

## 2018-02-10 DIAGNOSIS — Z5181 Encounter for therapeutic drug level monitoring: Secondary | ICD-10-CM | POA: Diagnosis not present

## 2018-02-10 LAB — COMPREHENSIVE METABOLIC PANEL
ALT: 16 U/L (ref 0–44)
AST: 17 U/L (ref 15–41)
Albumin: 3.8 g/dL (ref 3.5–5.0)
Alkaline Phosphatase: 64 U/L (ref 38–126)
Anion gap: 10 (ref 5–15)
BUN: 15 mg/dL (ref 8–23)
CO2: 25 mmol/L (ref 22–32)
Calcium: 9.7 mg/dL (ref 8.9–10.3)
Chloride: 103 mmol/L (ref 98–111)
Creatinine, Ser: 0.84 mg/dL (ref 0.61–1.24)
GFR calc Af Amer: >60 mL/min (ref >60)
GFR calc non Af Amer: >60 mL/min (ref >60)
Glucose, Bld: 178 mg/dL — ABNORMAL HIGH (ref 70–99)
Potassium: 3.8 mmol/L (ref 3.5–5.1)
Sodium: 138 mmol/L (ref 135–145)
Total Bilirubin: 0.6 mg/dL (ref 0.3–1.2)
Total Protein: 6.9 g/dL (ref 6.5–8.1)

## 2018-02-10 LAB — CBC WITH DIFFERENTIAL/PLATELET
Abs Immature Granulocytes: 0.43 10*3/uL — ABNORMAL HIGH (ref 0.00–0.07)
Basophils Absolute: 0 10*3/uL (ref 0.0–0.1)
Basophils Relative: 0 %
Eosinophils Absolute: 0.1 10*3/uL (ref 0.0–0.5)
Eosinophils Relative: 1 %
HCT: 38.4 % — ABNORMAL LOW (ref 39.0–52.0)
Hemoglobin: 13.1 g/dL (ref 13.0–17.0)
Immature Granulocytes: 6 %
Lymphocytes Relative: 26 %
Lymphs Abs: 1.9 10*3/uL (ref 0.7–4.0)
MCH: 30.8 pg (ref 26.0–34.0)
MCHC: 34.1 g/dL (ref 30.0–36.0)
MCV: 90.4 fL (ref 80.0–100.0)
Monocytes Absolute: 1.2 10*3/uL — ABNORMAL HIGH (ref 0.1–1.0)
Monocytes Relative: 16 %
Neutro Abs: 3.8 10*3/uL (ref 1.7–7.7)
Neutrophils Relative %: 51 %
Platelets: 247 10*3/uL (ref 150–400)
RBC: 4.25 MIL/uL (ref 4.22–5.81)
RDW: 13.4 % (ref 11.5–15.5)
WBC: 7.3 10*3/uL (ref 4.0–10.5)
nRBC: 0 % (ref 0.0–0.2)

## 2018-02-10 LAB — POCT INR: INR: 2.2 (ref 2.0–3.0)

## 2018-02-10 MED ORDER — DENOSUMAB 120 MG/1.7ML ~~LOC~~ SOLN
120.0000 mg | Freq: Once | SUBCUTANEOUS | Status: AC
  Administered 2018-02-10: 120 mg via SUBCUTANEOUS
  Filled 2018-02-10: qty 1.7

## 2018-02-10 NOTE — Progress Notes (Signed)
Pt presents today for XGeva and office visit with Dr. Delton Coombes. VSS. No complaints today. Pt seen by Dr. Delton Coombes and VO received to proceed with injection at this time.   Pt tolerated injection well. Paper tape and cotton ball placed. Pt placed in wheelchair by [facility] RN and taken to front desk. Assist needed. F/U appt's as scheduled. Family verbalized an understanding.

## 2018-02-10 NOTE — Assessment & Plan Note (Signed)
1.  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 -Started on Abiraterone and prednisone in October 2016, tolerating very well.  He is on every 38-month Lupron injections.  Last Lupron injection was on 09/06/2017.  Last PSA was down to 0.01 on the same day.  Last bone scan dated 08/02/2017  shows interval improvement in T12 lesion.  No new lesions were seen. -he was hospitalized from 01/31/2018 through 02/03/2018 with mental status changes and was treated for aspiration pneumonia.  He is still very weak.  He has not fallen any since hospital discharge. -I have recommended to cut back on Abiraterone to 500 mg daily at this time.  He will continue prednisone 2.5 mg twice daily. -I will reevaluate him in 4 weeks.  His last PSA on 12/21/2017 was undetectable.  2.  Bone metastasis: He will continue denosumab every 4 weeks.  His calcium was within normal limits.  3.  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  4.  DL BCL: He was originally diagnosed in 2005.  He does not have any symptoms or lymphadenopathy on the scan.  5.  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  No evidence of recurrence.

## 2018-02-10 NOTE — Telephone Encounter (Signed)
[name] with [facility] called   [phone]  INR 2.2 - PT 25.7

## 2018-02-10 NOTE — Patient Instructions (Signed)
Continue coumadin 5mg  daily.  Recheck 02/14/18 Continue Ensure 2 cans daily Started a Multivitamin without Vit K Order given to [employer]

## 2018-02-10 NOTE — Patient Instructions (Signed)
[facility] at [facility]  Discharge Instructions:   _______________________________________________________________  Thank you for choosing [facility] at [facility] to provide your oncology and hematology care.  To afford each patient quality time with our providers, please arrive at least 15 minutes before your scheduled appointment.  You need to re-schedule your appointment if you arrive 10 or more minutes late.  We strive to give you quality time with our providers, and arriving late affects you and other patients whose appointments are after yours.  Also, if you no show three or more times for appointments you may be dismissed from the clinic.  Again, thank you for choosing [facility] at [facility]. Our hope is that these requests will allow you access to exceptional care and in a timely manner. _______________________________________________________________  If you have questions after your visit, please contact our office at [phone] between the hours of 8:30 a.m. and 5:00 p.m. Voicemails left after 4:30 p.m. will not be returned until the following business day. _______________________________________________________________  For prescription refill requests, have your pharmacy contact our office. _______________________________________________________________  Recommendations made by the consultant and any test results will be sent to your referring physician. _______________________________________________________________ 

## 2018-02-10 NOTE — Telephone Encounter (Signed)
Done.  See coumadin note. 

## 2018-02-10 NOTE — Progress Notes (Signed)
[name], [place] [address]   CLINIC:  Medical Oncology/Hematology  PCP:  Rosita Fire, MD [place] [address] [phone]   REASON FOR VISIT:  Follow-up for metastatic prostate cancer to the bones.  CURRENT THERAPY: Abiraterone and prednisone.  BRIEF ONCOLOGIC HISTORY:    GIST (gastrointestinal stromal tumor), malignant ([place])   08/18/2011 Initial Diagnosis    GIST (gastrointestinal stromal tumor), malignant ([place])    02/08/2017 Genetic Testing    ATM Gain (Exons 62-63) VUS identified on the multi-gene panel.  The Multi-Gene Panel offered by [facility] includes sequencing and/or deletion duplication testing of the following 80 genes: ALK, APC, ATM, AXIN2,BAP1,  BARD1, BLM, BMPR1A, BRCA1, BRCA2, BRIP1, CASR, CDC73, CDH1, CDK4, CDKN1B, CDKN1C, CDKN2A (p14ARF), CDKN2A (p16INK4a), CEBPA, CHEK2, CTNNA1, DICER1, DIS3L2, EGFR (c.2369C>T, p.Thr790Met variant only), EPCAM (Deletion/duplication testing only), FH, FLCN, GATA2, GPC3, GREM1 (Promoter region deletion/duplication testing only), HOXB13 (c.251G>A, p.Gly84Glu), HRAS, KIT, MAX, MEN1, MET, MITF (c.952G>A, p.Glu318Lys variant only), MLH1, MSH2, MSH3, MSH6, MUTYH, NBN, NF1, NF2, NTHL1, PALB2, PDGFRA, PHOX2B, PMS2, POLD1, POLE, POT1, PRKAR1A, PTCH1, PTEN, RAD50, RAD51C, RAD51D, RB1, RECQL4, RET, RUNX1, SDHAF2, SDHA (sequence changes only), SDHB, SDHC, SDHD, SMAD4, SMARCA4, SMARCB1, SMARCE1, STK11, SUFU, TERT, TERT, TMEM127, TP53, TSC1, TSC2, VHL, WRN and WT1.  The report date is February 08, 2017.      Prostate cancer ([place])   09/08/2013 Initial Diagnosis    Prostate cancer ([place])    02/08/2017 Genetic Testing    ATM Gain (Exons 62-63) VUS identified on the multi-gene panel.  The Multi-Gene Panel offered by [facility] includes sequencing and/or deletion duplication testing of the following 80 genes: ALK, APC, ATM, AXIN2,BAP1,  BARD1, BLM, BMPR1A, BRCA1, BRCA2, BRIP1, CASR, CDC73,  CDH1, CDK4, CDKN1B, CDKN1C, CDKN2A (p14ARF), CDKN2A (p16INK4a), CEBPA, CHEK2, CTNNA1, DICER1, DIS3L2, EGFR (c.2369C>T, p.Thr790Met variant only), EPCAM (Deletion/duplication testing only), FH, FLCN, GATA2, GPC3, GREM1 (Promoter region deletion/duplication testing only), HOXB13 (c.251G>A, p.Gly84Glu), HRAS, KIT, MAX, MEN1, MET, MITF (c.952G>A, p.Glu318Lys variant only), MLH1, MSH2, MSH3, MSH6, MUTYH, NBN, NF1, NF2, NTHL1, PALB2, PDGFRA, PHOX2B, PMS2, POLD1, POLE, POT1, PRKAR1A, PTCH1, PTEN, RAD50, RAD51C, RAD51D, RB1, RECQL4, RET, RUNX1, SDHAF2, SDHA (sequence changes only), SDHB, SDHC, SDHD, SMAD4, SMARCA4, SMARCB1, SMARCE1, STK11, SUFU, TERT, TERT, TMEM127, TP53, TSC1, TSC2, VHL, WRN and WT1.  The report date is February 08, 2017.       CANCER STAGING: Cancer Staging NHL (non-Hodgkin's lymphoma) (HCC) Staging form: Lymphoid Neoplasms, AJCC 6th Edition - Clinical: Stage III - Signed by Baird Cancer, PA on 08/18/2011    INTERVAL HISTORY:  [name] 84 y.o. male returns for follow-up along with his daughter.  He has fallen 3 times since last visit.  He was admitted from 01/31/2018 through 02/03/2018 with mental status changes and was treated for aspiration pneumonia.  He is still continuing to be weak.  He also feels lightheaded.  Denies any falls since discharge from the hospital.  He will start physical therapy tomorrow.   REVIEW OF SYSTEMS:  Review of Systems  Constitutional: Positive for fatigue.  HENT:   Positive for trouble swallowing.   Gastrointestinal: Positive for diarrhea.  Neurological: Positive for dizziness.  Hematological: Bruises/bleeds easily.  All other systems reviewed and are negative.    PAST MEDICAL/SURGICAL HISTORY:  Past Medical History:  Diagnosis Date  . Acute bronchitis   . Arthritis   . Atrial fibrillation ([place])    Dr. Harl Bowie- [facility] follows saw 11'14  . Bladder stones  tx. with oral meds and antibiotics, now surgery planned  . Cataracts, both  eyes    surgery planned May 2015  . Diabetes mellitus without complication ([place])    Type II  . Dyspnea    with activity  . Dysrhythmia    Afib  . Family history of breast cancer   . Family history of colon cancer   . GERD (gastroesophageal reflux disease)   . GIST (gastrointestinal stromal tumor), malignant ([place]) 2005   Gastrointestinal stromal tumor, that is GIST, small bowel, 4.5 cm, intermediate prognostic grade found on the PET scan in the small bowel, accounting for that small bowel activity in October 2005 with resection by Dr. Margot Chimes, thus far without recurrence.   . Hypertension   . NHL (non-Hodgkin's lymphoma) ([place]) 2005   Diffuse large B-cell lymphoma, clinically stage IIIA, CD20 positive, status post cervical lymph node biopsy 08/03/2003 on the left. PET scan was also positive in the spleen and small bowel region, but bone marrow aspiration and biopsy were negative. So he essentially had stage IIIAs. He received R-CHOP x6 cycles with CR established by PET scan criteria on 11/23/2003 with no evidence for relapse th  . PAD (peripheral artery disease) ([place]) 08/27/2017  . Skin cancer    Basal cell- face,head, neck,  arms, legs, Back   Past Surgical History:  Procedure Laterality Date  . AMPUTATION Left 06/29/2017   Procedure: AMPUTATION LEFT GREAT TOE;  Surgeon: [name], MD;  Location: [facility];  Service: Vascular;  Laterality: Left;  . AMPUTATION Left 08/27/2017   Procedure: AMPUTATION Left SECOND TOE;  Surgeon: [name], MD;  Location: [facility];  Service: Vascular;  Laterality: Left;  . CATARACT EXTRACTION Bilateral 2015  . CHOLECYSTECTOMY    . COLON RESECTION     small bowel  . CYSTOSCOPY WITH LITHOLAPAXY N/A 08/10/2013   Procedure: CYSTOSCOPY WITH LITHOLAPAXY WITH [name];  Surgeon: [name], MD;  [facility];  Service: Urology;  Laterality: N/A;  . ESOPHAGEAL DILATION N/A 02/27/2016   Procedure: ESOPHAGEAL DILATION;  Surgeon: [name],  MD;  [facility];  Service: Endoscopy;  Laterality: N/A;  . ESOPHAGOGASTRODUODENOSCOPY N/A 02/27/2016   Procedure: ESOPHAGOGASTRODUODENOSCOPY (EGD);  Surgeon: [name], MD;  [facility];  Service: Endoscopy;  Laterality: N/A;  12:00  . INCISION AND DRAINAGE PERIRECTAL ABSCESS    . LOWER EXTREMITY ANGIOGRAPHY N/A 06/25/2017   Procedure: LOWER EXTREMITY ANGIOGRAPHY;  Surgeon: [name], MD;  Location: [facility];  Service: Cardiovascular;  Laterality: N/A;  . LYMPH NODE DISSECTION Left    '05-neck  . PORT-A-CATH REMOVAL    . PORTACATH PLACEMENT     insertion and removal -last chemotherapy 10 yrs ago  . TRANSURETHRAL RESECTION OF PROSTATE N/A 08/10/2013   Procedure: TRANSURETHRAL RESECTION OF THE PROSTATE WITH GYRUS INSTRUMENTS;  Surgeon: [name], MD;  Location: [facility];  Service: Urology;  Laterality: N/A;     SOCIAL HISTORY:  Social History   Socioeconomic History  . Marital status: Married    Spouse name: Not on file  . Number of children: Not on file  . Years of education: Not on file  . Highest education level: Not on file  Occupational History  . Occupation: retired    Fish farm manager: RETIRED    Comment: salesman  Social Needs  . Financial resource strain: Not on file  . Food insecurity:    Worry: Not on file    Inability: Not on  file  . Transportation needs:    Medical: Not on file    Non-medical: Not on file  Tobacco Use  . Smoking status: Never Smoker  . Smokeless tobacco: Never Used  Substance and Sexual Activity  . Alcohol use: No  . Drug use: No  . Sexual activity: Not Currently  Lifestyle  . Physical activity:    Days per week: Not on file    Minutes per session: Not on file  . Stress: Not on file  Relationships  . Social connections:    Talks on phone: Not on file    Gets together: Not on file    Attends religious service: Not on file    Active member of club or organization: Not on file    Attends  meetings of clubs or organizations: Not on file    Relationship status: Not on file  . Intimate partner violence:    Fear of current or ex partner: Not on file    Emotionally abused: Not on file    Physically abused: Not on file    Forced sexual activity: Not on file  Other Topics Concern  . Not on file  Social History Narrative  . Not on file    FAMILY HISTORY:  Family History  Problem Relation Age of Onset  . Heart attack Mother        died in her 40s  . Other Father 82       pneumonia - died  . Breast cancer Sister        dx in her 28s-60s  . Colon cancer Brother        dx in his 18s  . Cancer Sister        TAH/BSO due to "male cancer"  . Breast cancer Daughter 33  . Breast cancer Daughter 42       DCIS - ATM VUS on [facility] 83 gene panel in 2018  . Breast cancer Daughter 41       DCIS - Negative on [facility] MyRisk 25 gene apnel in 2015  . Thyroid cancer Daughter 23       Medullary and Papillary  . Thyroid nodules Grandchild     CURRENT MEDICATIONS:  Outpatient Encounter Medications as of 02/10/2018  Medication Sig Note  . abiraterone acetate (ZYTIGA) 250 MG tablet Take 4 tablets (1,000 mg total) by mouth daily. Take on an empty stomach 1 hour before or 2 hours after a meal (Patient taking differently: Take 1,000 mg by mouth daily before breakfast. Take on an empty stomach 1 hour before or 2 hours after a meal)   . acetaminophen (TYLENOL) 500 MG tablet Take 500-1,000 mg by mouth daily as needed for mild pain, fever or headache.   . Calcium Carb-Cholecalciferol (CALCIUM 600+D) 600-800 MG-UNIT TABS Take 1 tablet by mouth 2 (two) times daily.   . Cholecalciferol (VITAMIN D3) 1000 units CAPS Take 1 capsule by mouth every morning.   . clotrimazole-betamethasone (LOTRISONE) cream Apply 1 application topically daily as needed (for fungus).    . Cranberry-Vitamin C-Vitamin E (CRANBERRY PLUS VITAMIN C) 4200-20-3 MG-MG-UNIT CAPS Take 1 capsule by mouth every morning.   .  denosumab (XGEVA) 120 MG/1.7ML SOLN injection Inject 120 mg into the skin every 30 (thirty) days.  02/08/2018: Due for injection on 02/10/2018 following blood work  . diltiazem (CARDIZEM) 90 MG tablet TAKE 1 TABLET BY MOUTH TWICE DAILY -- **NEEDS APPOINTMENT FOR FURTHER REFILLS** (Patient taking differently: Take 90 mg by mouth 2 (two)  times daily. )   . docusate sodium (COLACE) 100 MG capsule Take 100 mg by mouth daily as needed for mild constipation.   . furosemide (LASIX) 20 MG tablet Take 1 tablet (20 mg total) by mouth daily. (Patient taking differently: Take 20 mg by mouth every morning. )   . glipiZIDE (GLUCOTROL XL) 5 MG 24 hr tablet Take 5 mg by mouth daily with breakfast.    . guaiFENesin (MUCINEX) 600 MG 12 hr tablet Take 600 mg by mouth daily as needed for cough or to loosen phlegm.    Marland Kitchen Leuprolide Acetate, 6 Month, (LUPRON DEPOT) 45 MG injection Inject 45 mg into the muscle every 6 (six) months.   Marland Kitchen lisinopril (PRINIVIL,ZESTRIL) 20 MG tablet Take 1 tablet (20 mg total) daily by mouth. (Patient taking differently: Take 20 mg by mouth every morning. )   . loperamide (IMODIUM) 2 MG capsule Take 2 mg by mouth daily as needed for diarrhea or loose stools.    . metFORMIN (GLUCOPHAGE) 850 MG tablet Take 850 mg by mouth 2 (two) times daily with a meal.    . Multiple Vitamin (MULTIVITAMIN WITH MINERALS) TABS tablet Take 1 tablet by mouth every morning.   . nystatin (MYCOSTATIN) powder Apply 1 g topically 4 (four) times daily as needed (fungus).    Marland Kitchen omeprazole (PRILOSEC) 40 MG capsule Take 20 mg by mouth every other day.    . potassium chloride (K-DUR,KLOR-CON) 10 MEQ tablet Take 2 tablets (20 mEq total) by mouth daily. (Patient taking differently: Take 10 mEq by mouth 2 (two) times daily. )   . sennosides-docusate sodium (SENOKOT-S) 8.6-50 MG tablet Take 1 tablet by mouth daily as needed for constipation.    . VENTOLIN HFA 108 (90 Base) MCG/ACT inhaler Inhale 1-2 puffs into the lungs every 6  (six) hours as needed for wheezing or shortness of breath.    . warfarin (COUMADIN) 2.5 MG tablet Take 2 - 3 tablets daily as directed by the anticoagulation clinic (Patient taking differently: Take 2.5-5 mg by mouth See admin instructions. As directed by Coumadin clinic) 02/08/2018: Coumadin levels were checked on 02/07/2018 and were at 4.1. Coumadin was held. Patient is due to take 25m on 02/08/2018 and have INR levels rechecked on 02/09/2018  . [DISCONTINUED] predniSONE (DELTASONE) 5 MG tablet Take 0.5 tablets (2.5 mg total) by mouth 2 (two) times daily with a meal.   . [EXPIRED] denosumab (XGEVA) injection 120 mg     No facility-administered encounter medications on file as of 02/10/2018.     ALLERGIES:  Allergies  Allergen Reactions  . Augmentin [Amoxicillin-Pot Clavulanate] Rash    Redness of skin     PHYSICAL EXAM:  ECOG Performance status: 1  Vitals:   02/10/18 1133  BP: 105/74  Pulse: 60  Resp: 18  SpO2: 93%   There were no vitals filed for this visit.  Physical Exam HEENT: Oropharynx has no thrush. Chest: Bilaterally clear to auscultation. CVS: S1-S2 regular rate and rhythm. Abdomen: Soft nontender with no palpable masses. Extremity's: 1+ edema bilaterally.  LABORATORY DATA:  I have reviewed the labs as listed.  CBC    Component Value Date/Time   WBC 7.3 02/10/2018 1041   RBC 4.25 02/10/2018 1041   HGB 13.1 02/10/2018 1041   HCT 38.4 (L) 02/10/2018 1041   PLT 247 02/10/2018 1041   MCV 90.4 02/10/2018 1041   MCH 30.8 02/10/2018 1041   MCHC 34.1 02/10/2018 1041   RDW 13.4 02/10/2018 1041   LYMPHSABS  1.9 02/10/2018 1041   MONOABS 1.2 (H) 02/10/2018 1041   EOSABS 0.1 02/10/2018 1041   BASOSABS 0.0 02/10/2018 1041   CMP Latest Ref Rng & Units 02/10/2018 02/08/2018 02/03/2018  Glucose 70 - 99 mg/dL 178(H) 114(H) 139(H)  BUN 8 - 23 mg/dL 15 13 13   Creatinine 0.61 - 1.24 mg/dL 0.84 0.79 0.72  Sodium 135 - 145 mmol/L 138 139 138  Potassium 3.5 - 5.1 mmol/L  3.8 3.6 4.8  Chloride 98 - 111 mmol/L 103 104 109  CO2 22 - 32 mmol/L 25 25 23   Calcium 8.9 - 10.3 mg/dL 9.7 9.5 8.0(L)  Total Protein 6.5 - 8.1 g/dL 6.9 6.8 -  Total Bilirubin 0.3 - 1.2 mg/dL 0.6 0.9 -  Alkaline Phos 38 - 126 U/L 64 66 -  AST 15 - 41 U/L 17 22 -  ALT 0 - 44 U/L 16 16 -          ASSESSMENT & PLAN:   Prostate cancer ([place]) 1.  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 -Started on Abiraterone and prednisone in October 2016, tolerating very well.  He is on every 67-monthLupron injections.  Last Lupron injection was on 09/06/2017.  Last PSA was down to 0.01 on the same day.  Last bone scan dated 08/02/2017  shows interval improvement in T12 lesion.  No new lesions were seen. -he was hospitalized from 01/31/2018 through 02/03/2018 with mental status changes and was treated for aspiration pneumonia.  He is still very weak.  He has not fallen any since hospital discharge. -I have recommended to cut back on Abiraterone to 500 mg daily at this time.  He will continue prednisone 2.5 mg twice daily. -I will reevaluate him in 4 weeks.  His last PSA on 12/21/2017 was undetectable.  2.  Bone metastasis: He will continue denosumab every 4 weeks.  His calcium was within normal limits.  3.  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  4.  DL BCL: He was originally diagnosed in 2005.  He does not have any symptoms or lymphadenopathy on the scan.  5.  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  No evidence of recurrence.      Orders placed this encounter:  Orders Placed This Encounter  Procedures  . CMP ([place]pointonly)  . CBC with Differential ([place]Only)  . PSA      SDerek Jack MD [phone]

## 2018-02-11 DIAGNOSIS — C7951 Secondary malignant neoplasm of bone: Secondary | ICD-10-CM | POA: Diagnosis not present

## 2018-02-11 DIAGNOSIS — I70203 Unspecified atherosclerosis of native arteries of extremities, bilateral legs: Secondary | ICD-10-CM | POA: Diagnosis not present

## 2018-02-11 DIAGNOSIS — J189 Pneumonia, unspecified organism: Secondary | ICD-10-CM | POA: Diagnosis not present

## 2018-02-11 DIAGNOSIS — E1151 Type 2 diabetes mellitus with diabetic peripheral angiopathy without gangrene: Secondary | ICD-10-CM | POA: Diagnosis not present

## 2018-02-11 DIAGNOSIS — C61 Malignant neoplasm of prostate: Secondary | ICD-10-CM | POA: Diagnosis not present

## 2018-02-11 DIAGNOSIS — C8590 Non-Hodgkin lymphoma, unspecified, unspecified site: Secondary | ICD-10-CM | POA: Diagnosis not present

## 2018-02-14 ENCOUNTER — Telehealth: Payer: Self-pay | Admitting: Cardiology

## 2018-02-14 ENCOUNTER — Ambulatory Visit (INDEPENDENT_AMBULATORY_CARE_PROVIDER_SITE_OTHER): Payer: Medicare Other | Admitting: *Deleted

## 2018-02-14 DIAGNOSIS — E1151 Type 2 diabetes mellitus with diabetic peripheral angiopathy without gangrene: Secondary | ICD-10-CM | POA: Diagnosis not present

## 2018-02-14 DIAGNOSIS — C8590 Non-Hodgkin lymphoma, unspecified, unspecified site: Secondary | ICD-10-CM | POA: Diagnosis not present

## 2018-02-14 DIAGNOSIS — I4891 Unspecified atrial fibrillation: Secondary | ICD-10-CM | POA: Diagnosis not present

## 2018-02-14 DIAGNOSIS — S90129A Contusion of unspecified lesser toe(s) without damage to nail, initial encounter: Secondary | ICD-10-CM | POA: Diagnosis not present

## 2018-02-14 DIAGNOSIS — C7951 Secondary malignant neoplasm of bone: Secondary | ICD-10-CM | POA: Diagnosis not present

## 2018-02-14 DIAGNOSIS — I70203 Unspecified atherosclerosis of native arteries of extremities, bilateral legs: Secondary | ICD-10-CM | POA: Diagnosis not present

## 2018-02-14 DIAGNOSIS — Z5181 Encounter for therapeutic drug level monitoring: Secondary | ICD-10-CM | POA: Diagnosis not present

## 2018-02-14 DIAGNOSIS — T148XXA Other injury of unspecified body region, initial encounter: Secondary | ICD-10-CM | POA: Diagnosis not present

## 2018-02-14 DIAGNOSIS — J189 Pneumonia, unspecified organism: Secondary | ICD-10-CM | POA: Diagnosis not present

## 2018-02-14 DIAGNOSIS — C61 Malignant neoplasm of prostate: Secondary | ICD-10-CM | POA: Diagnosis not present

## 2018-02-14 LAB — POCT INR: INR: 2 (ref 2.0–3.0)

## 2018-02-14 NOTE — Telephone Encounter (Signed)
LMOM for [facility] Nurse with [facility] to continue coumadin 5mg  daily and recheck INR in 1 week.

## 2018-02-14 NOTE — Patient Instructions (Signed)
Continue coumadin 5mg  daily.  Recheck 11/4 Continue Ensure 2 cans daily Started a Multivitamin without Vit K Order given to [employer]

## 2018-02-14 NOTE — Telephone Encounter (Signed)
inr 2.0  5mg 

## 2018-02-15 DIAGNOSIS — J690 Pneumonitis due to inhalation of food and vomit: Secondary | ICD-10-CM | POA: Diagnosis not present

## 2018-02-15 DIAGNOSIS — R296 Repeated falls: Secondary | ICD-10-CM | POA: Diagnosis not present

## 2018-02-15 DIAGNOSIS — G9341 Metabolic encephalopathy: Secondary | ICD-10-CM | POA: Diagnosis not present

## 2018-02-15 DIAGNOSIS — I482 Chronic atrial fibrillation, unspecified: Secondary | ICD-10-CM | POA: Diagnosis not present

## 2018-02-16 ENCOUNTER — Other Ambulatory Visit: Payer: Self-pay | Admitting: *Deleted

## 2018-02-16 DIAGNOSIS — C61 Malignant neoplasm of prostate: Secondary | ICD-10-CM | POA: Diagnosis not present

## 2018-02-16 DIAGNOSIS — I70203 Unspecified atherosclerosis of native arteries of extremities, bilateral legs: Secondary | ICD-10-CM | POA: Diagnosis not present

## 2018-02-16 DIAGNOSIS — E1151 Type 2 diabetes mellitus with diabetic peripheral angiopathy without gangrene: Secondary | ICD-10-CM | POA: Diagnosis not present

## 2018-02-16 DIAGNOSIS — J189 Pneumonia, unspecified organism: Secondary | ICD-10-CM | POA: Diagnosis not present

## 2018-02-16 DIAGNOSIS — C8590 Non-Hodgkin lymphoma, unspecified, unspecified site: Secondary | ICD-10-CM | POA: Diagnosis not present

## 2018-02-16 DIAGNOSIS — C7951 Secondary malignant neoplasm of bone: Secondary | ICD-10-CM | POA: Diagnosis not present

## 2018-02-16 NOTE — Patient Outreach (Signed)
Telephone call to patient's daughter [name] for transition of care week 3, (patient's wife cannot hear well over the phone), HIPAA verified, [name] reports pt saw primary care MD 10/29 and follow up in one month and have bloodwork done.  Primary care MD is sending prescription to DME company for lightweight wheelchair, home health continues working with pt, pt has had no falls, CBG today 104,  188 yesterday.  No new concerns voiced.  THN CM Care Plan Problem One     Most Recent Value  Care Plan Problem One  Multiple co-morbidities including prostate cancer stage 4 with mets  Role Documenting the Problem One  Care Management Coordinator  Care Plan for Problem One  Active  THN Long Term Goal   Pt, family will verbalize, demonstrate improved self care, understanding to facilitate better outcomes for cancer, multiple co-morbidities within 60 days  THN Long Term Goal Start Date  02/09/18  Interventions for Problem One Long Term Goal  RN CM reivewed with daughter [name] pt did attend primary care appointment on 02/15/18 with "good report" pt back on mirtazipine, pt has all medications and taking as prescribed,    THN CM Short Term Goal #1   Pt will change positions q 2 hours and walk several times throughout the house daily with assistance within 30 days.  THN CM Short Term Goal #1 Start Date  02/09/18  Interventions for Short Term Goal #1  RN CM reinforced importance of changing positions q 2 hours, family is trying to do this.    THN CM Care Plan Problem Two     Most Recent Value  Care Plan Problem Two  Pt high risk for falls  Role Documenting the Problem Two  Care Management Coordinator  THN CM Short Term Goal #1   Pt, family will verbalize, demonstrate safety precautions within 30 days  THN CM Short Term Goal #1 Start Date  02/09/18  Interventions for Short Term Goal #2   RN CM reinforced safety precautions,  pt has had no falls  THN CM Short Term Goal #2   Pt will work with home health PT  and try to complete prescribed exercises within 30 days.  THN CM Short Term Goal #2 Start Date  02/09/18  Interventions for Short Term Goal #2  Reinforced importance of working with PT and completing prescribed exercises,  daughter states pt is trying to do this daily and continues to work towards this goal      PLAN Continue weekly transition of care  Jacqlyn Larsen [facility], [place] Coordinator [phone]

## 2018-02-17 ENCOUNTER — Other Ambulatory Visit: Payer: Self-pay | Admitting: Cardiology

## 2018-02-17 DIAGNOSIS — C7951 Secondary malignant neoplasm of bone: Secondary | ICD-10-CM | POA: Diagnosis not present

## 2018-02-17 DIAGNOSIS — J189 Pneumonia, unspecified organism: Secondary | ICD-10-CM | POA: Diagnosis not present

## 2018-02-17 DIAGNOSIS — C61 Malignant neoplasm of prostate: Secondary | ICD-10-CM | POA: Diagnosis not present

## 2018-02-17 DIAGNOSIS — E1151 Type 2 diabetes mellitus with diabetic peripheral angiopathy without gangrene: Secondary | ICD-10-CM | POA: Diagnosis not present

## 2018-02-17 DIAGNOSIS — I70203 Unspecified atherosclerosis of native arteries of extremities, bilateral legs: Secondary | ICD-10-CM | POA: Diagnosis not present

## 2018-02-17 DIAGNOSIS — C8590 Non-Hodgkin lymphoma, unspecified, unspecified site: Secondary | ICD-10-CM | POA: Diagnosis not present

## 2018-02-18 DIAGNOSIS — I70203 Unspecified atherosclerosis of native arteries of extremities, bilateral legs: Secondary | ICD-10-CM | POA: Diagnosis not present

## 2018-02-18 DIAGNOSIS — J189 Pneumonia, unspecified organism: Secondary | ICD-10-CM | POA: Diagnosis not present

## 2018-02-18 DIAGNOSIS — E1151 Type 2 diabetes mellitus with diabetic peripheral angiopathy without gangrene: Secondary | ICD-10-CM | POA: Diagnosis not present

## 2018-02-18 DIAGNOSIS — C7951 Secondary malignant neoplasm of bone: Secondary | ICD-10-CM | POA: Diagnosis not present

## 2018-02-18 DIAGNOSIS — C8590 Non-Hodgkin lymphoma, unspecified, unspecified site: Secondary | ICD-10-CM | POA: Diagnosis not present

## 2018-02-18 DIAGNOSIS — C61 Malignant neoplasm of prostate: Secondary | ICD-10-CM | POA: Diagnosis not present

## 2018-02-21 DIAGNOSIS — J189 Pneumonia, unspecified organism: Secondary | ICD-10-CM | POA: Diagnosis not present

## 2018-02-22 ENCOUNTER — Ambulatory Visit (HOSPITAL_COMMUNITY): Payer: Medicare Other

## 2018-02-22 ENCOUNTER — Other Ambulatory Visit (HOSPITAL_COMMUNITY): Payer: Medicare Other

## 2018-02-22 DIAGNOSIS — E1151 Type 2 diabetes mellitus with diabetic peripheral angiopathy without gangrene: Secondary | ICD-10-CM | POA: Diagnosis not present

## 2018-02-22 DIAGNOSIS — C7951 Secondary malignant neoplasm of bone: Secondary | ICD-10-CM | POA: Diagnosis not present

## 2018-02-22 DIAGNOSIS — I70203 Unspecified atherosclerosis of native arteries of extremities, bilateral legs: Secondary | ICD-10-CM | POA: Diagnosis not present

## 2018-02-22 DIAGNOSIS — C61 Malignant neoplasm of prostate: Secondary | ICD-10-CM | POA: Diagnosis not present

## 2018-02-22 DIAGNOSIS — C8590 Non-Hodgkin lymphoma, unspecified, unspecified site: Secondary | ICD-10-CM | POA: Diagnosis not present

## 2018-02-22 DIAGNOSIS — J189 Pneumonia, unspecified organism: Secondary | ICD-10-CM | POA: Diagnosis not present

## 2018-02-23 ENCOUNTER — Other Ambulatory Visit: Payer: Self-pay | Admitting: *Deleted

## 2018-02-23 ENCOUNTER — Ambulatory Visit (INDEPENDENT_AMBULATORY_CARE_PROVIDER_SITE_OTHER): Payer: Medicare Other | Admitting: *Deleted

## 2018-02-23 DIAGNOSIS — I4891 Unspecified atrial fibrillation: Secondary | ICD-10-CM | POA: Diagnosis not present

## 2018-02-23 DIAGNOSIS — J189 Pneumonia, unspecified organism: Secondary | ICD-10-CM | POA: Diagnosis not present

## 2018-02-23 DIAGNOSIS — I70203 Unspecified atherosclerosis of native arteries of extremities, bilateral legs: Secondary | ICD-10-CM | POA: Diagnosis not present

## 2018-02-23 DIAGNOSIS — C7951 Secondary malignant neoplasm of bone: Secondary | ICD-10-CM | POA: Diagnosis not present

## 2018-02-23 DIAGNOSIS — C61 Malignant neoplasm of prostate: Secondary | ICD-10-CM | POA: Diagnosis not present

## 2018-02-23 DIAGNOSIS — C8590 Non-Hodgkin lymphoma, unspecified, unspecified site: Secondary | ICD-10-CM | POA: Diagnosis not present

## 2018-02-23 DIAGNOSIS — E1151 Type 2 diabetes mellitus with diabetic peripheral angiopathy without gangrene: Secondary | ICD-10-CM | POA: Diagnosis not present

## 2018-02-23 DIAGNOSIS — Z5181 Encounter for therapeutic drug level monitoring: Secondary | ICD-10-CM | POA: Diagnosis not present

## 2018-02-23 LAB — POCT INR: INR: 2.1 (ref 2.0–3.0)

## 2018-02-23 NOTE — Patient Instructions (Signed)
Continue coumadin 5mg  daily.  Recheck 11/4 Continue Ensure 2 cans daily Started a Multivitamin without Vit K Order given to [employer]

## 2018-02-23 NOTE — Patient Outreach (Signed)
Telephone call to patient's daughter [name] for transition of care week 4, per [name] "he's doing well, feeling stronger"  No changes to report.  RN CM reviewed plan of care and discharge plans with daughter.   THN CM Care Plan Problem One     Most Recent Value  Care Plan Problem One  Multiple co-morbidities including prostate cancer stage 4 with mets  Role Documenting the Problem One  Care Management Coordinator  Care Plan for Problem One  Active  THN Long Term Goal   Pt, family will verbalize, demonstrate improved self care, understanding to facilitate better outcomes for cancer, multiple co-morbidities within 60 days  [facility]N Long Term Goal Start Date  02/09/18  Interventions for Problem One Long Term Goal  RN CM reviewed recent CBG readings with daughter [name], 114, 101, 96, 145, No new concerns reported and pt taking medications as prescribed.  [facility]N CM Short Term Goal #1   Pt will change positions q 2 hours and walk several times throughout the house daily with assistance within 30 days.  [facility]N CM Short Term Goal #1 Start Date  02/09/18  [facility] CM Short Term Goal #1 Met Date  02/23/18  Interventions for Short Term Goal #1  Pt continues working with PT and walking more, pt did go on outing yesterday with church members    [facility] CM Care Plan Problem Two     Most Recent Value  Care Plan Problem Two  Pt high risk for falls  Role Documenting the Problem Two  Care Management Coordinator  THN CM Short Term Goal #1   Pt, family will verbalize, demonstrate safety precautions within 30 days  [facility]N CM Short Term Goal #1 Start Date  02/09/18  Interventions for Short Term Goal #2   RN CM reiterated safety precautions,  per daughter, pt has had no falls  THN CM Short Term Goal #2   Pt will work with home health PT and try to complete prescribed exercises within 30 days.  THN CM Short Term Goal #2 Start Date  02/09/18  [facility] CM Short Term Goal #2 Met Date  02/23/18  Interventions for Short Term Goal #2   Physical therapy is ongoing, pt is completing prescribed exercises and feeling stronger      PLAN Call pt in 2 weeks for telephone assessment Plan to discharge if no further needs  Jacqlyn Larsen [facility], [place] Coordinator [phone]

## 2018-02-24 ENCOUNTER — Telehealth (HOSPITAL_COMMUNITY): Payer: Self-pay | Admitting: Hematology

## 2018-02-24 DIAGNOSIS — J189 Pneumonia, unspecified organism: Secondary | ICD-10-CM | POA: Diagnosis not present

## 2018-02-24 DIAGNOSIS — E1151 Type 2 diabetes mellitus with diabetic peripheral angiopathy without gangrene: Secondary | ICD-10-CM | POA: Diagnosis not present

## 2018-02-24 DIAGNOSIS — C7951 Secondary malignant neoplasm of bone: Secondary | ICD-10-CM | POA: Diagnosis not present

## 2018-02-24 DIAGNOSIS — I70203 Unspecified atherosclerosis of native arteries of extremities, bilateral legs: Secondary | ICD-10-CM | POA: Diagnosis not present

## 2018-02-24 DIAGNOSIS — C8590 Non-Hodgkin lymphoma, unspecified, unspecified site: Secondary | ICD-10-CM | POA: Diagnosis not present

## 2018-02-24 DIAGNOSIS — C61 Malignant neoplasm of prostate: Secondary | ICD-10-CM | POA: Diagnosis not present

## 2018-02-24 NOTE — Telephone Encounter (Signed)
[facility] PT ASSIST EXTENDED PTS ELIGIBILITY TO 04/20/2019.

## 2018-02-25 ENCOUNTER — Other Ambulatory Visit: Payer: Self-pay | Admitting: Neurology

## 2018-03-01 ENCOUNTER — Ambulatory Visit (INDEPENDENT_AMBULATORY_CARE_PROVIDER_SITE_OTHER): Payer: Medicare Other | Admitting: *Deleted

## 2018-03-01 ENCOUNTER — Telehealth: Payer: Self-pay | Admitting: Cardiology

## 2018-03-01 DIAGNOSIS — C8590 Non-Hodgkin lymphoma, unspecified, unspecified site: Secondary | ICD-10-CM | POA: Diagnosis not present

## 2018-03-01 DIAGNOSIS — J189 Pneumonia, unspecified organism: Secondary | ICD-10-CM | POA: Diagnosis not present

## 2018-03-01 DIAGNOSIS — E1151 Type 2 diabetes mellitus with diabetic peripheral angiopathy without gangrene: Secondary | ICD-10-CM | POA: Diagnosis not present

## 2018-03-01 DIAGNOSIS — I70203 Unspecified atherosclerosis of native arteries of extremities, bilateral legs: Secondary | ICD-10-CM | POA: Diagnosis not present

## 2018-03-01 DIAGNOSIS — I4891 Unspecified atrial fibrillation: Secondary | ICD-10-CM

## 2018-03-01 DIAGNOSIS — C7951 Secondary malignant neoplasm of bone: Secondary | ICD-10-CM | POA: Diagnosis not present

## 2018-03-01 DIAGNOSIS — Z5181 Encounter for therapeutic drug level monitoring: Secondary | ICD-10-CM

## 2018-03-01 DIAGNOSIS — C61 Malignant neoplasm of prostate: Secondary | ICD-10-CM | POA: Diagnosis not present

## 2018-03-01 LAB — POCT INR: INR: 2.3 (ref 2.0–3.0)

## 2018-03-01 NOTE — Patient Instructions (Signed)
Continue coumadin 5mg  daily.  Recheck in 2 weeks Continue Ensure 2 cans daily Started a Multivitamin without Vit K Order given to [employer]

## 2018-03-01 NOTE — Telephone Encounter (Signed)
Done.  See coumadin note. 

## 2018-03-01 NOTE — Telephone Encounter (Signed)
inr 2.3

## 2018-03-02 ENCOUNTER — Telehealth: Payer: Self-pay | Admitting: *Deleted

## 2018-03-02 ENCOUNTER — Other Ambulatory Visit: Payer: Self-pay | Admitting: Neurology

## 2018-03-02 DIAGNOSIS — C7951 Secondary malignant neoplasm of bone: Secondary | ICD-10-CM | POA: Diagnosis not present

## 2018-03-02 DIAGNOSIS — J189 Pneumonia, unspecified organism: Secondary | ICD-10-CM | POA: Diagnosis not present

## 2018-03-02 DIAGNOSIS — C8590 Non-Hodgkin lymphoma, unspecified, unspecified site: Secondary | ICD-10-CM | POA: Diagnosis not present

## 2018-03-02 DIAGNOSIS — E1151 Type 2 diabetes mellitus with diabetic peripheral angiopathy without gangrene: Secondary | ICD-10-CM | POA: Diagnosis not present

## 2018-03-02 DIAGNOSIS — C61 Malignant neoplasm of prostate: Secondary | ICD-10-CM | POA: Diagnosis not present

## 2018-03-02 DIAGNOSIS — I70203 Unspecified atherosclerosis of native arteries of extremities, bilateral legs: Secondary | ICD-10-CM | POA: Diagnosis not present

## 2018-03-02 NOTE — Telephone Encounter (Signed)
Called pt's daughter [name]. She asks what dose of Tylenol is safe for her father to take occasionally for mild headaches. Please advise.

## 2018-03-02 NOTE — Telephone Encounter (Signed)
Please give pt's daughter [name] a call-- has a question concerning medication

## 2018-03-02 NOTE — Telephone Encounter (Signed)
Pt was taken off sleeping meds due to fall risk.  Daughter has been having pt take a tylenol Extra Strength at night for rest.  Told her that would not affect pt's coumadin.  Could also try a Tylenol PM if someone is there to watch him.  Daughter [name] verbalized understanding.

## 2018-03-04 DIAGNOSIS — C8590 Non-Hodgkin lymphoma, unspecified, unspecified site: Secondary | ICD-10-CM | POA: Diagnosis not present

## 2018-03-04 DIAGNOSIS — J189 Pneumonia, unspecified organism: Secondary | ICD-10-CM | POA: Diagnosis not present

## 2018-03-04 DIAGNOSIS — C7951 Secondary malignant neoplasm of bone: Secondary | ICD-10-CM | POA: Diagnosis not present

## 2018-03-04 DIAGNOSIS — C61 Malignant neoplasm of prostate: Secondary | ICD-10-CM | POA: Diagnosis not present

## 2018-03-04 DIAGNOSIS — I70203 Unspecified atherosclerosis of native arteries of extremities, bilateral legs: Secondary | ICD-10-CM | POA: Diagnosis not present

## 2018-03-04 DIAGNOSIS — E1151 Type 2 diabetes mellitus with diabetic peripheral angiopathy without gangrene: Secondary | ICD-10-CM | POA: Diagnosis not present

## 2018-03-08 DIAGNOSIS — F0390 Unspecified dementia without behavioral disturbance: Secondary | ICD-10-CM | POA: Diagnosis not present

## 2018-03-08 DIAGNOSIS — C8580 Other specified types of non-Hodgkin lymphoma, unspecified site: Secondary | ICD-10-CM | POA: Diagnosis not present

## 2018-03-08 DIAGNOSIS — C61 Malignant neoplasm of prostate: Secondary | ICD-10-CM | POA: Diagnosis not present

## 2018-03-08 DIAGNOSIS — Z1331 Encounter for screening for depression: Secondary | ICD-10-CM | POA: Diagnosis not present

## 2018-03-08 DIAGNOSIS — M1990 Unspecified osteoarthritis, unspecified site: Secondary | ICD-10-CM | POA: Diagnosis not present

## 2018-03-08 DIAGNOSIS — E1151 Type 2 diabetes mellitus with diabetic peripheral angiopathy without gangrene: Secondary | ICD-10-CM | POA: Diagnosis not present

## 2018-03-08 DIAGNOSIS — E669 Obesity, unspecified: Secondary | ICD-10-CM | POA: Diagnosis not present

## 2018-03-08 DIAGNOSIS — E039 Hypothyroidism, unspecified: Secondary | ICD-10-CM | POA: Diagnosis not present

## 2018-03-08 DIAGNOSIS — Z0001 Encounter for general adult medical examination with abnormal findings: Secondary | ICD-10-CM | POA: Diagnosis not present

## 2018-03-08 DIAGNOSIS — I70203 Unspecified atherosclerosis of native arteries of extremities, bilateral legs: Secondary | ICD-10-CM | POA: Diagnosis not present

## 2018-03-08 DIAGNOSIS — E538 Deficiency of other specified B group vitamins: Secondary | ICD-10-CM | POA: Diagnosis not present

## 2018-03-08 DIAGNOSIS — J449 Chronic obstructive pulmonary disease, unspecified: Secondary | ICD-10-CM | POA: Diagnosis not present

## 2018-03-08 DIAGNOSIS — Z1389 Encounter for screening for other disorder: Secondary | ICD-10-CM | POA: Diagnosis not present

## 2018-03-08 DIAGNOSIS — E785 Hyperlipidemia, unspecified: Secondary | ICD-10-CM | POA: Diagnosis not present

## 2018-03-08 DIAGNOSIS — C8590 Non-Hodgkin lymphoma, unspecified, unspecified site: Secondary | ICD-10-CM | POA: Diagnosis not present

## 2018-03-08 DIAGNOSIS — K219 Gastro-esophageal reflux disease without esophagitis: Secondary | ICD-10-CM | POA: Diagnosis not present

## 2018-03-08 DIAGNOSIS — R739 Hyperglycemia, unspecified: Secondary | ICD-10-CM | POA: Diagnosis not present

## 2018-03-08 DIAGNOSIS — Z0000 Encounter for general adult medical examination without abnormal findings: Secondary | ICD-10-CM | POA: Diagnosis not present

## 2018-03-08 DIAGNOSIS — C7951 Secondary malignant neoplasm of bone: Secondary | ICD-10-CM | POA: Diagnosis not present

## 2018-03-08 DIAGNOSIS — I10 Essential (primary) hypertension: Secondary | ICD-10-CM | POA: Diagnosis not present

## 2018-03-08 DIAGNOSIS — E119 Type 2 diabetes mellitus without complications: Secondary | ICD-10-CM | POA: Diagnosis not present

## 2018-03-08 DIAGNOSIS — J189 Pneumonia, unspecified organism: Secondary | ICD-10-CM | POA: Diagnosis not present

## 2018-03-08 DIAGNOSIS — E1165 Type 2 diabetes mellitus with hyperglycemia: Secondary | ICD-10-CM | POA: Diagnosis not present

## 2018-03-08 LAB — POCT INR: INR: 2.1 (ref 2.0–3.0)

## 2018-03-09 ENCOUNTER — Inpatient Hospital Stay (HOSPITAL_COMMUNITY): Payer: Medicare Other | Attending: Hematology

## 2018-03-09 DIAGNOSIS — E119 Type 2 diabetes mellitus without complications: Secondary | ICD-10-CM | POA: Diagnosis not present

## 2018-03-09 DIAGNOSIS — Z7901 Long term (current) use of anticoagulants: Secondary | ICD-10-CM | POA: Insufficient documentation

## 2018-03-09 DIAGNOSIS — I10 Essential (primary) hypertension: Secondary | ICD-10-CM | POA: Diagnosis not present

## 2018-03-09 DIAGNOSIS — Z79818 Long term (current) use of other agents affecting estrogen receptors and estrogen levels: Secondary | ICD-10-CM | POA: Insufficient documentation

## 2018-03-09 DIAGNOSIS — I739 Peripheral vascular disease, unspecified: Secondary | ICD-10-CM | POA: Diagnosis not present

## 2018-03-09 DIAGNOSIS — Z8509 Personal history of malignant neoplasm of other digestive organs: Secondary | ICD-10-CM | POA: Diagnosis not present

## 2018-03-09 DIAGNOSIS — Z7984 Long term (current) use of oral hypoglycemic drugs: Secondary | ICD-10-CM | POA: Insufficient documentation

## 2018-03-09 DIAGNOSIS — Z8572 Personal history of non-Hodgkin lymphomas: Secondary | ICD-10-CM | POA: Insufficient documentation

## 2018-03-09 DIAGNOSIS — R109 Unspecified abdominal pain: Secondary | ICD-10-CM | POA: Insufficient documentation

## 2018-03-09 DIAGNOSIS — C7951 Secondary malignant neoplasm of bone: Secondary | ICD-10-CM | POA: Diagnosis not present

## 2018-03-09 DIAGNOSIS — C61 Malignant neoplasm of prostate: Secondary | ICD-10-CM | POA: Insufficient documentation

## 2018-03-09 DIAGNOSIS — Z79899 Other long term (current) drug therapy: Secondary | ICD-10-CM | POA: Insufficient documentation

## 2018-03-09 DIAGNOSIS — Z9049 Acquired absence of other specified parts of digestive tract: Secondary | ICD-10-CM | POA: Insufficient documentation

## 2018-03-09 DIAGNOSIS — I4891 Unspecified atrial fibrillation: Secondary | ICD-10-CM | POA: Diagnosis not present

## 2018-03-09 DIAGNOSIS — R197 Diarrhea, unspecified: Secondary | ICD-10-CM | POA: Diagnosis not present

## 2018-03-09 LAB — COMPREHENSIVE METABOLIC PANEL
ALT: 16 U/L (ref 0–44)
AST: 18 U/L (ref 15–41)
Albumin: 3.8 g/dL (ref 3.5–5.0)
Alkaline Phosphatase: 69 U/L (ref 38–126)
Anion gap: 9 (ref 5–15)
BUN: 17 mg/dL (ref 8–23)
CO2: 26 mmol/L (ref 22–32)
Calcium: 10.2 mg/dL (ref 8.9–10.3)
Chloride: 104 mmol/L (ref 98–111)
Creatinine, Ser: 0.7 mg/dL (ref 0.61–1.24)
GFR calc Af Amer: >60 mL/min (ref >60)
GFR calc non Af Amer: >60 mL/min (ref >60)
Glucose, Bld: 137 mg/dL — ABNORMAL HIGH (ref 70–99)
Potassium: 3.5 mmol/L (ref 3.5–5.1)
Sodium: 139 mmol/L (ref 135–145)
Total Bilirubin: 0.7 mg/dL (ref 0.3–1.2)
Total Protein: 6.9 g/dL (ref 6.5–8.1)

## 2018-03-09 LAB — CBC WITH DIFFERENTIAL/PLATELET
Abs Immature Granulocytes: 0.12 10*3/uL — ABNORMAL HIGH (ref 0.00–0.07)
Basophils Absolute: 0 10*3/uL (ref 0.0–0.1)
Basophils Relative: 0 %
Eosinophils Absolute: 0 10*3/uL (ref 0.0–0.5)
Eosinophils Relative: 1 %
HCT: 38.1 % — ABNORMAL LOW (ref 39.0–52.0)
Hemoglobin: 13.1 g/dL (ref 13.0–17.0)
Immature Granulocytes: 2 %
Lymphocytes Relative: 44 %
Lymphs Abs: 2.4 10*3/uL (ref 0.7–4.0)
MCH: 31 pg (ref 26.0–34.0)
MCHC: 34.4 g/dL (ref 30.0–36.0)
MCV: 90.3 fL (ref 80.0–100.0)
Monocytes Absolute: 0.8 10*3/uL (ref 0.1–1.0)
Monocytes Relative: 14 %
Neutro Abs: 2.2 10*3/uL (ref 1.7–7.7)
Neutrophils Relative %: 39 %
Platelets: 178 10*3/uL (ref 150–400)
RBC: 4.22 MIL/uL (ref 4.22–5.81)
RDW: 13.8 % (ref 11.5–15.5)
WBC: 5.6 10*3/uL (ref 4.0–10.5)
nRBC: 0 % (ref 0.0–0.2)

## 2018-03-09 LAB — PSA: Prostatic Specific Antigen: <0.01 ng/mL (ref 0.00–4.00)

## 2018-03-10 ENCOUNTER — Other Ambulatory Visit (HOSPITAL_COMMUNITY): Payer: Self-pay | Admitting: *Deleted

## 2018-03-10 ENCOUNTER — Other Ambulatory Visit (HOSPITAL_COMMUNITY): Payer: Medicare Other

## 2018-03-10 ENCOUNTER — Encounter: Payer: Self-pay | Admitting: *Deleted

## 2018-03-10 ENCOUNTER — Other Ambulatory Visit: Payer: Self-pay | Admitting: *Deleted

## 2018-03-10 ENCOUNTER — Ambulatory Visit (HOSPITAL_COMMUNITY): Payer: Medicare Other

## 2018-03-10 NOTE — Patient Outreach (Signed)
RN CM called pt for telephonic assessment, spoke with daughter [name], HIPAA verified, [name] is at patient's home assisting, CBG ranges 91-123, continues working with PT "and making good progress",  Attending all MD appointments, no falls, saw primary MD Dr. Legrand Rams this week and is on aricept for memory issues.  RN CM reviewed aricept purpose and side effects.  [name] feels like pt is showing improvement with walking and endurance, no new concerns voiced.  RN CM discussed discharge plan and will close case today.  RN CM faxed case closure letter to Dr. Legrand Rams and mailed case closure letter to pt home.  [facility]N CM Care Plan Problem One     Most Recent Value  Care Plan Problem One  Multiple co-morbidities including prostate cancer stage 4 with mets  Role Documenting the Problem One  Care Management Coordinator  Care Plan for Problem One  Active  THN Long Term Goal   Pt, family will verbalize, demonstrate improved self care, understanding to facilitate better outcomes for cancer, multiple co-morbidities within 60 days  THN Long Term Goal Start Date  02/09/18  [facility] Long Term Goal Met Date  03/10/18  Interventions for Problem One Long Term Goal  RN CM reviewed plan of care with daughter [name] who reports pt has completed application for long term care in the home (through insurance policy) and completed application for VA services in the home, now has HCPOA and living will completed along with financial documents with lawyer.  reviewed with [name], pt has medications and taking as presribed, continues to have good family support.  THN CM Short Term Goal #1   Pt will change positions q 2 hours and walk several times throughout the house daily with assistance within 30 days.  THN CM Short Term Goal #1 Start Date  02/09/18  [facility]N CM Short Term Goal #1 Met Date  02/23/18    [facility] CM Care Plan Problem Two     Most Recent Value  Care Plan Problem Two  Pt high risk for falls  Role Documenting the Problem Two  Care  Management Coordinator  THN CM Short Term Goal #1   Pt, family will verbalize, demonstrate safety precautions within 30 days  THN CM Short Term Goal #1 Start Date  02/09/18  [facility] CM Short Term Goal #1 Met Date   03/10/18  Interventions for Short Term Goal #2   RN CM reinforced safety precautions, pt continues daily exercises, is getting out more and has had no falls.  [facility]N CM Short Term Goal #2   Pt will work with home health PT and try to complete prescribed exercises within 30 days.  THN CM Short Term Goal #2 Start Date  02/09/18  [facility] CM Short Term Goal #2 Met Date  02/23/18      Close case  Jacqlyn Larsen [facility], [place] Coordinator [phone]

## 2018-03-11 ENCOUNTER — Inpatient Hospital Stay (HOSPITAL_BASED_OUTPATIENT_CLINIC_OR_DEPARTMENT_OTHER): Payer: Medicare Other | Admitting: Hematology

## 2018-03-11 ENCOUNTER — Encounter (HOSPITAL_COMMUNITY): Payer: Self-pay | Admitting: Hematology

## 2018-03-11 ENCOUNTER — Inpatient Hospital Stay (HOSPITAL_COMMUNITY): Payer: Medicare Other

## 2018-03-11 ENCOUNTER — Other Ambulatory Visit (HOSPITAL_COMMUNITY): Payer: Medicare Other

## 2018-03-11 VITALS — BP 159/87 | HR 85 | Temp 97.4°F | Resp 16

## 2018-03-11 DIAGNOSIS — R109 Unspecified abdominal pain: Secondary | ICD-10-CM

## 2018-03-11 DIAGNOSIS — I10 Essential (primary) hypertension: Secondary | ICD-10-CM

## 2018-03-11 DIAGNOSIS — C7951 Secondary malignant neoplasm of bone: Secondary | ICD-10-CM | POA: Diagnosis not present

## 2018-03-11 DIAGNOSIS — I739 Peripheral vascular disease, unspecified: Secondary | ICD-10-CM | POA: Diagnosis not present

## 2018-03-11 DIAGNOSIS — C61 Malignant neoplasm of prostate: Secondary | ICD-10-CM | POA: Diagnosis not present

## 2018-03-11 DIAGNOSIS — Z79899 Other long term (current) drug therapy: Secondary | ICD-10-CM | POA: Diagnosis not present

## 2018-03-11 DIAGNOSIS — Z7901 Long term (current) use of anticoagulants: Secondary | ICD-10-CM

## 2018-03-11 DIAGNOSIS — E119 Type 2 diabetes mellitus without complications: Secondary | ICD-10-CM

## 2018-03-11 DIAGNOSIS — R197 Diarrhea, unspecified: Secondary | ICD-10-CM | POA: Diagnosis not present

## 2018-03-11 DIAGNOSIS — Z7984 Long term (current) use of oral hypoglycemic drugs: Secondary | ICD-10-CM

## 2018-03-11 DIAGNOSIS — I4891 Unspecified atrial fibrillation: Secondary | ICD-10-CM

## 2018-03-11 DIAGNOSIS — Z79818 Long term (current) use of other agents affecting estrogen receptors and estrogen levels: Secondary | ICD-10-CM | POA: Diagnosis not present

## 2018-03-11 DIAGNOSIS — Z9049 Acquired absence of other specified parts of digestive tract: Secondary | ICD-10-CM

## 2018-03-11 DIAGNOSIS — Z8509 Personal history of malignant neoplasm of other digestive organs: Secondary | ICD-10-CM

## 2018-03-11 DIAGNOSIS — Z8572 Personal history of non-Hodgkin lymphomas: Secondary | ICD-10-CM

## 2018-03-11 MED ORDER — DENOSUMAB 120 MG/1.7ML ~~LOC~~ SOLN
120.0000 mg | Freq: Once | SUBCUTANEOUS | Status: AC
  Administered 2018-03-11: 120 mg via SUBCUTANEOUS
  Filled 2018-03-11: qty 1.7

## 2018-03-11 MED ORDER — ABIRATERONE ACETATE 250 MG PO TABS: 500.0000 mg | ORAL_TABLET | Freq: Every day | ORAL | 6 refills | Status: DC

## 2018-03-11 MED ORDER — LEUPROLIDE ACETATE (6 MONTH) 45 MG IM KIT
45.0000 mg | PACK | Freq: Once | INTRAMUSCULAR | Status: AC
  Administered 2018-03-11: 45 mg via INTRAMUSCULAR
  Filled 2018-03-11: qty 45

## 2018-03-11 NOTE — Progress Notes (Signed)
[address], [place] [address]   CLINIC:  Medical Oncology/Hematology  PCP:  Rosita Fire, MD [place] [address] [phone]   REASON FOR VISIT:  Follow-up for metastatic prostate cancer to the bones  CURRENT THERAPY: Abiraterone (Zytiga) and prednisone  BRIEF ONCOLOGIC HISTORY:    GIST (gastrointestinal stromal tumor), malignant ([place])   08/18/2011 Initial Diagnosis    GIST (gastrointestinal stromal tumor), malignant ([place])    02/08/2017 Genetic Testing    ATM Gain (Exons 62-63) VUS identified on the multi-gene panel.  The Multi-Gene Panel offered by [facility] includes sequencing and/or deletion duplication testing of the following 80 genes: ALK, APC, ATM, AXIN2,BAP1,  BARD1, BLM, BMPR1A, BRCA1, BRCA2, BRIP1, CASR, CDC73, CDH1, CDK4, CDKN1B, CDKN1C, CDKN2A (p14ARF), CDKN2A (p16INK4a), CEBPA, CHEK2, CTNNA1, DICER1, DIS3L2, EGFR (c.2369C>T, p.Thr790Met variant only), EPCAM (Deletion/duplication testing only), FH, FLCN, GATA2, GPC3, GREM1 (Promoter region deletion/duplication testing only), HOXB13 (c.251G>A, p.Gly84Glu), HRAS, KIT, MAX, MEN1, MET, MITF (c.952G>A, p.Glu318Lys variant only), MLH1, MSH2, MSH3, MSH6, MUTYH, NBN, NF1, NF2, NTHL1, PALB2, PDGFRA, PHOX2B, PMS2, POLD1, POLE, POT1, PRKAR1A, PTCH1, PTEN, RAD50, RAD51C, RAD51D, RB1, RECQL4, RET, RUNX1, SDHAF2, SDHA (sequence changes only), SDHB, SDHC, SDHD, SMAD4, SMARCA4, SMARCB1, SMARCE1, STK11, SUFU, TERT, TERT, TMEM127, TP53, TSC1, TSC2, VHL, WRN and WT1.  The report date is February 08, 2017.      Prostate cancer ([place])   09/08/2013 Initial Diagnosis    Prostate cancer ([place])    02/08/2017 Genetic Testing    ATM Gain (Exons 62-63) VUS identified on the multi-gene panel.  The Multi-Gene Panel offered by [facility] includes sequencing and/or deletion duplication testing of the following 80 genes: ALK, APC, ATM, AXIN2,BAP1,  BARD1, BLM, BMPR1A, BRCA1, BRCA2, BRIP1, CASR,  CDC73, CDH1, CDK4, CDKN1B, CDKN1C, CDKN2A (p14ARF), CDKN2A (p16INK4a), CEBPA, CHEK2, CTNNA1, DICER1, DIS3L2, EGFR (c.2369C>T, p.Thr790Met variant only), EPCAM (Deletion/duplication testing only), FH, FLCN, GATA2, GPC3, GREM1 (Promoter region deletion/duplication testing only), HOXB13 (c.251G>A, p.Gly84Glu), HRAS, KIT, MAX, MEN1, MET, MITF (c.952G>A, p.Glu318Lys variant only), MLH1, MSH2, MSH3, MSH6, MUTYH, NBN, NF1, NF2, NTHL1, PALB2, PDGFRA, PHOX2B, PMS2, POLD1, POLE, POT1, PRKAR1A, PTCH1, PTEN, RAD50, RAD51C, RAD51D, RB1, RECQL4, RET, RUNX1, SDHAF2, SDHA (sequence changes only), SDHB, SDHC, SDHD, SMAD4, SMARCA4, SMARCB1, SMARCE1, STK11, SUFU, TERT, TERT, TMEM127, TP53, TSC1, TSC2, VHL, WRN and WT1.  The report date is February 08, 2017.       CANCER STAGING: Cancer Staging NHL (non-Hodgkin's lymphoma) (HCC) Staging form: Lymphoid Neoplasms, AJCC 6th Edition - Clinical: Stage III - Signed by Baird Cancer, PA on 08/18/2011    INTERVAL HISTORY:  [name] 84 y.o. male returns for routine follow-up  He fell on October 14th and was hospitalized afterwards. He became very weak. Since we cut back on his zitiga and he has improved. His stability is getting better and his memory has improved. He has pain in his stomach and has diarrhea most days. His stool is watery and has several a day. He denies any nausea or vomiting. Denies any bleeding or blood in his stool. Denies any skin rashes or mouth sores. He reports his appetite at 50% and his energy level at 50%.    REVIEW OF SYSTEMS:  Review of Systems  Gastrointestinal: Positive for diarrhea.  All other systems reviewed and are negative.    PAST MEDICAL/SURGICAL HISTORY:  Past Medical History:  Diagnosis Date  . Acute bronchitis   . Arthritis   . Atrial fibrillation ([place])    Dr. Harl Bowie- [facility] follows  saw 11'14  . Bladder stones    tx. with oral meds and antibiotics, now surgery planned  . Cataracts, both eyes    surgery planned  May 2015  . Diabetes mellitus without complication ([place])    Type II  . Dyspnea    with activity  . Dysrhythmia    Afib  . Family history of breast cancer   . Family history of colon cancer   . GERD (gastroesophageal reflux disease)   . 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 Margot Chimes, thus far without recurrence.   . Hypertension   . NHL (non-Hodgkin's lymphoma) ([place]) 2005   Diffuse large B-cell lymphoma, clinically stage IIIA, CD20 positive, status post cervical lymph node biopsy 08/03/2003 on the left. PET scan was also positive in the spleen and small bowel region, but bone marrow aspiration and biopsy were negative. So he essentially had stage IIIAs. He received R-CHOP x6 cycles with CR established by PET scan criteria on 11/23/2003 with no evidence for relapse th  . PAD (peripheral artery disease) ([place]) 08/27/2017  . Skin cancer    Basal cell- face,head, neck,  arms, legs, Back   Past Surgical History:  Procedure Laterality Date  . AMPUTATION Left 06/29/2017   Procedure: AMPUTATION LEFT GREAT TOE;  Surgeon: [name], MD;  Location: [facility];  Service: Vascular;  Laterality: Left;  . AMPUTATION Left 08/27/2017   Procedure: AMPUTATION Left SECOND TOE;  Surgeon: [name], MD;  Location: [facility];  Service: Vascular;  Laterality: Left;  . CATARACT EXTRACTION Bilateral 2015  . CHOLECYSTECTOMY    . COLON RESECTION     small bowel  . CYSTOSCOPY WITH LITHOLAPAXY N/A 08/10/2013   Procedure: CYSTOSCOPY WITH LITHOLAPAXY WITH [name];  Surgeon: [name], MD;  [facility];  Service: Urology;  Laterality: N/A;  . ESOPHAGEAL DILATION N/A 02/27/2016   Procedure: ESOPHAGEAL DILATION;  Surgeon: [name], MD;  [facility];  Service: Endoscopy;  Laterality: N/A;  . ESOPHAGOGASTRODUODENOSCOPY N/A 02/27/2016   Procedure: ESOPHAGOGASTRODUODENOSCOPY (EGD);  Surgeon: [name], MD;  [facility];  Service: Endoscopy;  Laterality: N/A;  12:00  . INCISION AND DRAINAGE PERIRECTAL ABSCESS    . LOWER EXTREMITY ANGIOGRAPHY N/A 06/25/2017   Procedure: LOWER EXTREMITY ANGIOGRAPHY;  Surgeon: [name], MD;  Location: [facility];  Service: Cardiovascular;  Laterality: N/A;  . LYMPH NODE DISSECTION Left    '05-neck  . PORT-A-CATH REMOVAL    . PORTACATH PLACEMENT     insertion and removal -last chemotherapy 10 yrs ago  . TRANSURETHRAL RESECTION OF PROSTATE N/A 08/10/2013   Procedure: TRANSURETHRAL RESECTION OF THE PROSTATE WITH GYRUS INSTRUMENTS;  Surgeon: [name], MD;  Location[facility];  Service: Urology;  Laterality: N/A;     SOCIAL HISTORY:  Social History   Socioeconomic History  . Marital status: Married    Spouse name: Not on file  . Number of children: Not on file  . Years of education: Not on file  . Highest education level: Not on file  Occupational History  . Occupation: retired    Fish farm manager: RETIRED    Comment: salesman  Social Needs  . Financial resource strain: Not on file  . Food insecurity:    Worry:  Not on file    Inability: Not on file  . Transportation needs:    Medical: Not on file    Non-medical: Not on file  Tobacco Use  . Smoking status: Never Smoker  . Smokeless tobacco: Never Used  Substance and Sexual Activity  . Alcohol use: No  . Drug use: No  . Sexual activity: Not Currently  Lifestyle  . Physical activity:    Days per week: Not on file    Minutes per session: Not on file  . Stress: Not on file  Relationships  . Social connections:    Talks on phone: Not on file    Gets together: Not on file    Attends religious service: Not on file    Active member of club or organization: Not on file    Attends meetings of clubs or  organizations: Not on file    Relationship status: Not on file  . Intimate partner violence:    Fear of current or ex partner: Not on file    Emotionally abused: Not on file    Physically abused: Not on file    Forced sexual activity: Not on file  Other Topics Concern  . Not on file  Social History Narrative  . Not on file    FAMILY HISTORY:  Family History  Problem Relation Age of Onset  . Heart attack Mother        died in her 88s  . Other Father 26       pneumonia - died  . Breast cancer Sister        dx in her 63s-60s  . Colon cancer Brother        dx in his 26s  . Cancer Sister        TAH/BSO due to "male cancer"  . Breast cancer Daughter 46  . Breast cancer Daughter 48       DCIS - ATM VUS on [facility] 83 gene panel in 2018  . Breast cancer Daughter 22       DCIS - Negative on [facility] My[facility] 25 gene apnel in 2015  . Thyroid cancer Daughter 75       Medullary and Papillary  . Thyroid nodules Grandchild     CURRENT MEDICATIONS:  Outpatient Encounter Medications as of 03/11/2018  Medication Sig Note  . abiraterone acetate (ZYTIGA) 250 MG tablet Take 2 tablets (500 mg total) by mouth daily. Take on an empty stomach 1 hour before or 2 hours after a meal   . acetaminophen (TYLENOL) 500 MG tablet Take 500-1,000 mg by mouth daily as needed for mild pain, fever or headache.   . Calcium Carb-Cholecalciferol (CALCIUM 600+D) 600-800 MG-UNIT TABS Take 1 tablet by mouth 2 (two) times daily.   . Cholecalciferol (VITAMIN D3) 1000 units CAPS Take 1 capsule by mouth every morning.   . clotrimazole-betamethasone (LOTRISONE) cream Apply 1 application topically daily as needed (for fungus).    . Cranberry-Vitamin C-Vitamin E (CRANBERRY PLUS VITAMIN C) 4200-20-3 MG-MG-UNIT CAPS Take 1 capsule by mouth every morning.   . denosumab (XGEVA) 120 MG/1.7ML SOLN injection Inject 120 mg into the skin every 30 (thirty) days.  02/08/2018: Due for injection on 02/10/2018 following blood work  .  diltiazem (CARDIZEM) 90 MG tablet TAKE 1 TABLET BY MOUTH TWICE DAILY -- **NEEDS APPOINTMENT FOR FURTHER REFILLS** (Patient taking differently: Take 90 mg by mouth 2 (two) times daily. )   . diltiazem (CARDIZEM) 90 MG tablet TAKE 1 TABLET BY MOUTH  TWICE DAILY.  NEEDS APPOINTMENT FOR FUTHER REFILLS.   Marland Kitchen docusate sodium (COLACE) 100 MG capsule Take 100 mg by mouth daily as needed for mild constipation.   Marland Kitchen donepezil (ARICEPT) 5 MG tablet TAKE 1 TABLET BY MOUTH ONCE DAILY FOR 1 MONTH THEN FILL THE 10 MG PRESCRIPTION   . furosemide (LASIX) 20 MG tablet Take 1 tablet (20 mg total) by mouth daily. (Patient taking differently: Take 20 mg by mouth every morning. )   . glipiZIDE (GLUCOTROL XL) 5 MG 24 hr tablet Take 5 mg by mouth daily with breakfast.    . guaiFENesin (MUCINEX) 600 MG 12 hr tablet Take 600 mg by mouth daily as needed for cough or to loosen phlegm.    Marland Kitchen Leuprolide Acetate, 6 Month, (LUPRON DEPOT) 45 MG injection Inject 45 mg into the muscle every 6 (six) months.   Marland Kitchen lisinopril (PRINIVIL,ZESTRIL) 20 MG tablet Take 1 tablet (20 mg total) daily by mouth. (Patient taking differently: Take 20 mg by mouth every morning. )   . loperamide (IMODIUM) 2 MG capsule Take 2 mg by mouth daily as needed for diarrhea or loose stools.    . metFORMIN (GLUCOPHAGE) 850 MG tablet Take 850 mg by mouth 2 (two) times daily with a meal.    . Multiple Vitamin (MULTIVITAMIN WITH MINERALS) TABS tablet Take 1 tablet by mouth every morning.   . nystatin (MYCOSTATIN) powder Apply 1 g topically 4 (four) times daily as needed (fungus).    Marland Kitchen omeprazole (PRILOSEC) 40 MG capsule Take 20 mg by mouth every other day.    . potassium chloride (K-DUR,KLOR-CON) 10 MEQ tablet Take 2 tablets (20 mEq total) by mouth daily. (Patient taking differently: Take 10 mEq by mouth 2 (two) times daily. )   . sennosides-docusate sodium (SENOKOT-S) 8.6-50 MG tablet Take 1 tablet by mouth daily as needed for constipation.    . VENTOLIN HFA 108 (90  Base) MCG/ACT inhaler Inhale 1-2 puffs into the lungs every 6 (six) hours as needed for wheezing or shortness of breath.    . warfarin (COUMADIN) 2.5 MG tablet Take 2 - 3 tablets daily as directed by the anticoagulation clinic (Patient taking differently: Take 2.5-5 mg by mouth See admin instructions. As directed by Coumadin clinic) 02/08/2018: Coumadin levels were checked on 02/07/2018 and were at 4.1. Coumadin was held. Patient is due to take '5mg'$  on 02/08/2018 and have INR levels rechecked on 02/09/2018  . [DISCONTINUED] abiraterone acetate (ZYTIGA) 250 MG tablet Take 4 tablets (1,000 mg total) by mouth daily. Take on an empty stomach 1 hour before or 2 hours after a meal (Patient taking differently: Take 1,000 mg by mouth daily before breakfast. Take on an empty stomach 1 hour before or 2 hours after a meal)   . [EXPIRED] denosumab (XGEVA) injection 120 mg    . [EXPIRED] Leuprolide Acetate (6 Month) (LUPRON) injection 45 mg     No facility-administered encounter medications on file as of 03/11/2018.     ALLERGIES:  Allergies  Allergen Reactions  . Augmentin [Amoxicillin-Pot Clavulanate] Rash    Redness of skin     PHYSICAL EXAM:  ECOG Performance status: 1  There were no vitals filed for this visit. Filed Weights   03/11/18 1110  Weight: 153 lb 6.4 oz (69.6 kg)    Physical Exam  Constitutional: He is oriented to person, place, and time. He appears well-developed and well-nourished.  Musculoskeletal: Normal range of motion.  Neurological: He is alert and oriented to person, place,  and time.  Skin: Skin is warm and dry.  Psychiatric: He has a normal mood and affect. His behavior is normal. Judgment and thought content normal.     LABORATORY DATA:  I have reviewed the labs as listed.  CBC    Component Value Date/Time   WBC 5.6 03/09/2018 1343   RBC 4.22 03/09/2018 1343   HGB 13.1 03/09/2018 1343   HCT 38.1 (L) 03/09/2018 1343   PLT 178 03/09/2018 1343   MCV 90.3  03/09/2018 1343   MCH 31.0 03/09/2018 1343   MCHC 34.4 03/09/2018 1343   RDW 13.8 03/09/2018 1343   LYMPHSABS 2.4 03/09/2018 1343   MONOABS 0.8 03/09/2018 1343   EOSABS 0.0 03/09/2018 1343   BASOSABS 0.0 03/09/2018 1343   CMP Latest Ref Rng & Units 03/09/2018 02/10/2018 02/08/2018  Glucose 70 - 99 mg/dL 137(H) 178(H) 114(H)  BUN 8 - 23 mg/dL '17 15 13  '$ Creatinine 0.61 - 1.24 mg/dL 0.70 0.84 0.79  Sodium 135 - 145 mmol/L 139 138 139  Potassium 3.5 - 5.1 mmol/L 3.5 3.8 3.6  Chloride 98 - 111 mmol/L 104 103 104  CO2 22 - 32 mmol/L '26 25 25  '$ Calcium 8.9 - 10.3 mg/dL 10.2 9.7 9.5  Total Protein 6.5 - 8.1 g/dL 6.9 6.9 6.8  Total Bilirubin 0.3 - 1.2 mg/dL 0.7 0.6 0.9  Alkaline Phos 38 - 126 U/L 69 64 66  AST 15 - 41 U/L '18 17 22  '$ ALT 0 - 44 U/L '16 16 16        '$ ASSESSMENT & PLAN:   Prostate cancer ([facility]) 1.  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 -Started on Abiraterone and prednisone in October 2016, tolerating very well.  He is on every 52-monthLupron injections.  Last Lupron injection was on 09/06/2017.  Last PSA was down to 0.01 on the same day.  Last bone scan dated 08/02/2017  shows interval improvement in T12 lesion.  No new lesions were seen. -He was hospitalized from 01/31/2018 through 02/03/2018 with mental status changes and was treated for aspiration pneumonia.  - I have cut back on Abiraterone dose to 500 mg once daily and prednisone to 2.5 mg twice daily on 02/10/2018. - His energy levels have minimally improved.  He is continuing physical therapy every day.  He is able to walk with the help of walker.  He has not fallen since last discharge. -I have reviewed his blood work.  His PSA is still undetectable.  Lately he is having some problems with constipation.  He also had issues with diarrhea prior to that.  I have reviewed his medications.  He is on Senokot and Imodium.  He was told to temporarily hold both of them and eat more vegetables. -I  will reevaluate him in 1 month.  2.  Bone metastasis: He will continue denosumab every 4 weeks.  His calcium was within normal limits.  3.  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  4.  DL BCL: He was originally diagnosed in 2005.  He does not have any symptoms or lymphadenopathy on the scan.  5.  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  No evidence of recurrence.      Orders placed this encounter:  Orders Placed This Encounter  Procedures  . CBC with Differential/Platelet  . Comprehensive metabolic panel  . PSA      SDerek Jack  MD [place] [phone]

## 2018-03-11 NOTE — Progress Notes (Signed)
[name] presents today for injection per the provider's orders.  Xgeva and Lupron administration without incident; see MAR for injection details.  Patient tolerated procedure well and without incident.  No questions or complaints noted at this time.  Discharged via wheelchair in c/o family.

## 2018-03-11 NOTE — Assessment & Plan Note (Signed)
1.  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 -Started on Abiraterone and prednisone in October 2016, tolerating very well.  He is on every 28-month Lupron injections.  Last Lupron injection was on 09/06/2017.  Last PSA was down to 0.01 on the same day.  Last bone scan dated 08/02/2017  shows interval improvement in T12 lesion.  No new lesions were seen. -He was hospitalized from 01/31/2018 through 02/03/2018 with mental status changes and was treated for aspiration pneumonia.  - I have cut back on Abiraterone dose to 500 mg once daily and prednisone to 2.5 mg twice daily on 02/10/2018. - His energy levels have minimally improved.  He is continuing physical therapy every day.  He is able to walk with the help of walker.  He has not fallen since last discharge. -I have reviewed his blood work.  His PSA is still undetectable.  Lately he is having some problems with constipation.  He also had issues with diarrhea prior to that.  I have reviewed his medications.  He is on Senokot and Imodium.  He was told to temporarily hold both of them and eat more vegetables. -I will reevaluate him in 1 month.  2.  Bone metastasis: He will continue denosumab every 4 weeks.  His calcium was within normal limits.  3.  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  4.  DL BCL: He was originally diagnosed in 2005.  He does not have any symptoms or lymphadenopathy on the scan.  5.  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  No evidence of recurrence.

## 2018-03-11 NOTE — Patient Instructions (Signed)
[facility] at [facility] Discharge Instructions  Follow up in 4 weeks with labs    Thank you for choosing [facility] at [facility] to provide your oncology and hematology care.  To afford each patient quality time with our provider, please arrive at least 15 minutes before your scheduled appointment time.   If you have a lab appointment with the Cancer Center please come in thru the  Main Entrance and check in at the main information desk  You need to re-schedule your appointment should you arrive 10 or more minutes late.  We strive to give you quality time with our providers, and arriving late affects you and other patients whose appointments are after yours.  Also, if you no show three or more times for appointments you may be dismissed from the clinic at the providers discretion.     Again, thank you for choosing [facility].  Our hope is that these requests will decrease the amount of time that you wait before being seen by our physicians.       _____________________________________________________________  Should you have questions after your visit to  Cancer Center, please contact our office at [phone] between the hours of 8:00 a.m. and 4:30 p.m.  Voicemails left after 4:00 p.m. will not be returned until the following business day.  For prescription refill requests, have your pharmacy contact our office and allow 72 hours.    Cancer Center Support Programs:   > Cancer Support Group  2nd Tuesday of the month 1pm-2pm, Journey Room    

## 2018-03-14 DIAGNOSIS — I10 Essential (primary) hypertension: Secondary | ICD-10-CM | POA: Diagnosis not present

## 2018-03-14 DIAGNOSIS — E1165 Type 2 diabetes mellitus with hyperglycemia: Secondary | ICD-10-CM | POA: Diagnosis not present

## 2018-03-15 ENCOUNTER — Telehealth: Payer: Self-pay | Admitting: *Deleted

## 2018-03-15 ENCOUNTER — Ambulatory Visit (INDEPENDENT_AMBULATORY_CARE_PROVIDER_SITE_OTHER): Payer: Medicare Other | Admitting: *Deleted

## 2018-03-15 DIAGNOSIS — Z5181 Encounter for therapeutic drug level monitoring: Secondary | ICD-10-CM | POA: Diagnosis not present

## 2018-03-15 DIAGNOSIS — C7951 Secondary malignant neoplasm of bone: Secondary | ICD-10-CM | POA: Diagnosis not present

## 2018-03-15 DIAGNOSIS — N390 Urinary tract infection, site not specified: Secondary | ICD-10-CM | POA: Diagnosis not present

## 2018-03-15 DIAGNOSIS — C61 Malignant neoplasm of prostate: Secondary | ICD-10-CM | POA: Diagnosis not present

## 2018-03-15 DIAGNOSIS — I4891 Unspecified atrial fibrillation: Secondary | ICD-10-CM

## 2018-03-15 DIAGNOSIS — I70203 Unspecified atherosclerosis of native arteries of extremities, bilateral legs: Secondary | ICD-10-CM | POA: Diagnosis not present

## 2018-03-15 DIAGNOSIS — C8590 Non-Hodgkin lymphoma, unspecified, unspecified site: Secondary | ICD-10-CM | POA: Diagnosis not present

## 2018-03-15 DIAGNOSIS — J189 Pneumonia, unspecified organism: Secondary | ICD-10-CM | POA: Diagnosis not present

## 2018-03-15 DIAGNOSIS — E1151 Type 2 diabetes mellitus with diabetic peripheral angiopathy without gangrene: Secondary | ICD-10-CM | POA: Diagnosis not present

## 2018-03-15 NOTE — Patient Instructions (Signed)
Continue coumadin 5mg  daily.  Recheck in 2 weeks Continue Ensure 2 cans daily Started a Multivitamin without Vit K Order given to [employer]

## 2018-03-15 NOTE — Telephone Encounter (Signed)
Done.  See coumadin note. 

## 2018-03-16 DIAGNOSIS — D510 Vitamin B12 deficiency anemia due to intrinsic factor deficiency: Secondary | ICD-10-CM | POA: Diagnosis not present

## 2018-03-22 ENCOUNTER — Telehealth: Payer: Self-pay | Admitting: *Deleted

## 2018-03-22 DIAGNOSIS — E1151 Type 2 diabetes mellitus with diabetic peripheral angiopathy without gangrene: Secondary | ICD-10-CM | POA: Diagnosis not present

## 2018-03-22 DIAGNOSIS — I70203 Unspecified atherosclerosis of native arteries of extremities, bilateral legs: Secondary | ICD-10-CM | POA: Diagnosis not present

## 2018-03-22 DIAGNOSIS — C61 Malignant neoplasm of prostate: Secondary | ICD-10-CM | POA: Diagnosis not present

## 2018-03-22 DIAGNOSIS — J189 Pneumonia, unspecified organism: Secondary | ICD-10-CM | POA: Diagnosis not present

## 2018-03-22 DIAGNOSIS — C8590 Non-Hodgkin lymphoma, unspecified, unspecified site: Secondary | ICD-10-CM | POA: Diagnosis not present

## 2018-03-22 DIAGNOSIS — C7951 Secondary malignant neoplasm of bone: Secondary | ICD-10-CM | POA: Diagnosis not present

## 2018-03-22 NOTE — Telephone Encounter (Signed)
-----   Message from Drema Dallas, [place] sent at 03/22/2018  2:28 PM EST ----- Regarding: MELATONIN  PT'S DAUGHTER CALLED. WANTED TO KNOW IF SHE COULD GIVE HER DAD MELATONIN TO HELP HIM SLEEP- AND BE SURE IT WOULDN'T MESS UP HIS COUMADIN!  I TOLD HER YOU WILL CALL HER- [phone]  THANKS, LISA M.

## 2018-03-22 NOTE — Telephone Encounter (Signed)
Told daughter [name] that melatonin might increase INR level but willing to try it to see if it helps pt rest.  Will start 3mg  at bedtime.  She also just found out that pt has a UTI and will be starting Cipro tonight per Dr. Legrand Rams.  Told her to have pt decrease coumadin dose to 5mg  daily except 2.5mg  on Thursday, Saturday and Monday.  She verbalized understanding.  Home Health will check INR on 03/29/18.

## 2018-03-28 DIAGNOSIS — E1142 Type 2 diabetes mellitus with diabetic polyneuropathy: Secondary | ICD-10-CM | POA: Diagnosis not present

## 2018-03-28 DIAGNOSIS — B351 Tinea unguium: Secondary | ICD-10-CM | POA: Diagnosis not present

## 2018-03-29 ENCOUNTER — Ambulatory Visit (INDEPENDENT_AMBULATORY_CARE_PROVIDER_SITE_OTHER): Payer: Medicare Other | Admitting: Internal Medicine

## 2018-03-30 ENCOUNTER — Ambulatory Visit (INDEPENDENT_AMBULATORY_CARE_PROVIDER_SITE_OTHER): Payer: Medicare Other | Admitting: Internal Medicine

## 2018-03-30 ENCOUNTER — Encounter (INDEPENDENT_AMBULATORY_CARE_PROVIDER_SITE_OTHER): Payer: Self-pay | Admitting: Internal Medicine

## 2018-03-30 ENCOUNTER — Ambulatory Visit (INDEPENDENT_AMBULATORY_CARE_PROVIDER_SITE_OTHER): Payer: Medicare Other | Admitting: *Deleted

## 2018-03-30 VITALS — BP 142/80 | HR 56 | Temp 97.4°F | Ht 70.0 in | Wt 150.1 lb

## 2018-03-30 DIAGNOSIS — Z5181 Encounter for therapeutic drug level monitoring: Secondary | ICD-10-CM | POA: Diagnosis not present

## 2018-03-30 DIAGNOSIS — E1151 Type 2 diabetes mellitus with diabetic peripheral angiopathy without gangrene: Secondary | ICD-10-CM | POA: Diagnosis not present

## 2018-03-30 DIAGNOSIS — R159 Full incontinence of feces: Secondary | ICD-10-CM

## 2018-03-30 DIAGNOSIS — I4891 Unspecified atrial fibrillation: Secondary | ICD-10-CM

## 2018-03-30 DIAGNOSIS — C7951 Secondary malignant neoplasm of bone: Secondary | ICD-10-CM | POA: Diagnosis not present

## 2018-03-30 DIAGNOSIS — J189 Pneumonia, unspecified organism: Secondary | ICD-10-CM | POA: Diagnosis not present

## 2018-03-30 DIAGNOSIS — C61 Malignant neoplasm of prostate: Secondary | ICD-10-CM | POA: Diagnosis not present

## 2018-03-30 DIAGNOSIS — I70203 Unspecified atherosclerosis of native arteries of extremities, bilateral legs: Secondary | ICD-10-CM | POA: Diagnosis not present

## 2018-03-30 DIAGNOSIS — C8590 Non-Hodgkin lymphoma, unspecified, unspecified site: Secondary | ICD-10-CM | POA: Diagnosis not present

## 2018-03-30 LAB — POCT INR: INR: 1.5 — AB (ref 2.0–3.0)

## 2018-03-30 NOTE — Patient Instructions (Addendum)
Bedside commode. Increase fiber Try; Imodium every other day.

## 2018-03-30 NOTE — Progress Notes (Signed)
Subjective:    Patient ID: [name], male    DOB: 1923/09/04, 84 y.o.   MRN: [id number]  HPI Here today for f/u. Would like to talk about his BMs. He sometimes has ifecal ncontinence. Sometimes he is unaware of the incontinence. Marland Kitchen He says he cannot control his BMs at atimes.  Daughter states his BMs are always loose. Daughter states there is some substance but not real firm. Sometimes stools are like water. He takes Imodium on as needed basis.  He does take fiber in the morning.  He does take a stool softener as needed. No recent antibiotics He was in the hospital in October from a fall. Unable to communicate per daughter.   Underwent and EGDED in 2017 for dysphagia.                            passing 63 French Maloney dilator.                           - Small diverticulum in the lower third of the                            esophagus with these of food debris.                           - Benign-appearing esophageal stenosis distal to                            diverticulum dilated by passing 54 French Maloney                            dilator.                           - Mild Schatzki ring. Dilated an disrupted by                            passing 28 [place] Maloney dilator.                           - 3 cm hiatal hernia.                           - Portal hypertensive gastropathy.                           - A few gastric polyps.                           - Normal duodenal bulb and second portion of the                            duodenum.  Hx of atrial afib and maintained on warfarin.  Review of Systems Past Medical History:  Diagnosis Date  . Acute bronchitis   . Arthritis   . Atrial fibrillation ([employer])    Dr. Harl Bowie- [facility] follows saw 11'14  . Bladder stones    tx. with oral meds and  antibiotics, now surgery planned  . Cataracts, both eyes    surgery planned May 2015  . Diabetes mellitus without complication ([place])    Type II  . Dyspnea    with activity  .  Dysrhythmia    Afib  . Family history of breast cancer   . Family history of colon cancer   . GERD (gastroesophageal reflux disease)   . 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 Margot Chimes, thus far without recurrence.   . Hypertension   . NHL (non-Hodgkin's lymphoma) ([place]) 2005   Diffuse large B-cell lymphoma, clinically stage IIIA, CD20 positive, status post cervical lymph node biopsy 08/03/2003 on the left. PET scan was also positive in the spleen and small bowel region, but bone marrow aspiration and biopsy were negative. So he essentially had stage IIIAs. He received R-CHOP x6 cycles with CR established by PET scan criteria on 11/23/2003 with no evidence for relapse th  . PAD (peripheral artery disease) ([place]) 08/27/2017  . Skin cancer    Basal cell- face,head, neck,  arms, legs, Back    Past Surgical History:  Procedure Laterality Date  . AMPUTATION Left 06/29/2017   Procedure: AMPUTATION LEFT GREAT TOE;  Surgeon: [name], MD;  Location: [place];  Service: Vascular;  Laterality: Left;  . AMPUTATION Left 08/27/2017   Procedure: AMPUTATION Left SECOND TOE;  Surgeon: [name], MD;  Location: [place];  Service: Vascular;  Laterality: Left;  . CATARACT EXTRACTION Bilateral 2015  . CHOLECYSTECTOMY    . COLON RESECTION     small bowel  . CYSTOSCOPY WITH LITHOLAPAXY N/A 08/10/2013   Procedure: CYSTOSCOPY WITH LITHOLAPAXY WITH [name];  Surgeon: [name], MD;  Location: [facility];  Service: Urology;  Laterality: N/A;  . ESOPHAGEAL DILATION N/A 02/27/2016   Procedure: ESOPHAGEAL DILATION;  Surgeon: [name], MD;  [facility];  Service: Endoscopy;  Laterality: N/A;  . ESOPHAGOGASTRODUODENOSCOPY N/A 02/27/2016   Procedure:  ESOPHAGOGASTRODUODENOSCOPY (EGD);  Surgeon: [name], MD;  [facility];  Service: Endoscopy;  Laterality: N/A;  12:00  . INCISION AND DRAINAGE PERIRECTAL ABSCESS    . LOWER EXTREMITY ANGIOGRAPHY N/A 06/25/2017   Procedure: LOWER EXTREMITY ANGIOGRAPHY;  Surgeon: [name], MD;  Location: [facility];  Service: Cardiovascular;  Laterality: N/A;  . LYMPH NODE DISSECTION Left    '05-neck  . PORT-A-CATH REMOVAL    . PORTACATH PLACEMENT     insertion and removal -last chemotherapy 10 yrs ago  . TRANSURETHRAL RESECTION OF PROSTATE N/A 08/10/2013   Procedure: TRANSURETHRAL RESECTION OF THE PROSTATE WITH GYRUS INSTRUMENTS;  Surgeon: [name], MD;  Location: [facility];  Service: Urology;  Laterality: N/A;    Allergies  Allergen Reactions  . Augmentin [Amoxicillin-Pot Clavulanate] Rash    Redness of skin    Current Outpatient Medications on File Prior to Visit  Medication Sig Dispense Refill  . Abiraterone Acetate (ZYTIGA PO) Take by mouth.    Marland Kitchen abiraterone acetate (ZYTIGA) 250 MG tablet Take 2 tablets (500 mg total) by mouth daily. Take on an empty stomach 1 hour before or 2 hours after a meal 60 tablet 6  . acetaminophen (TYLENOL) 500 MG tablet Take 500-1,000 mg by mouth daily as needed for mild pain, fever  or headache.    . Calcium Carb-Cholecalciferol (CALCIUM 600+D) 600-800 MG-UNIT TABS Take 1 tablet by mouth 2 (two) times daily.    . Cholecalciferol (VITAMIN D3) 1000 units CAPS Take 1 capsule by mouth every morning.    . Cranberry-Vitamin C-Vitamin E (CRANBERRY PLUS VITAMIN C) 4200-20-3 MG-MG-UNIT CAPS Take 1 capsule by mouth every morning.    . Cyanocobalamin (VITAMIN B-12 IJ) Inject 100 mcg as directed every 30 (thirty) days.    Marland Kitchen denosumab (XGEVA) 120 MG/1.7ML SOLN injection Inject 120 mg into the skin every 30 (thirty) days.     Marland Kitchen diltiazem (CARDIZEM) 90 MG tablet TAKE 1 TABLET BY MOUTH TWICE DAILY -- **NEEDS APPOINTMENT FOR FURTHER REFILLS** (Patient  taking differently: Take 90 mg by mouth 2 (two) times daily. ) 60 tablet 0  . diphenhydrAMINE-APAP, sleep, (TYLENOL PM EXTRA STRENGTH PO) Take by mouth at bedtime.    . donepezil (ARICEPT) 5 MG tablet TAKE 1 TABLET BY MOUTH ONCE DAILY FOR 1 MONTH THEN FILL THE 10 MG PRESCRIPTION  0  . donepezil (ARICEPT) 5 MG tablet Take 5 mg by mouth at bedtime.    . furosemide (LASIX) 20 MG tablet Take 20 mg by mouth.    Marland Kitchen glipiZIDE (GLUCOTROL XL) 5 MG 24 hr tablet Take 5 mg by mouth daily with breakfast.     . Leuprolide Acetate, 6 Month, (LUPRON DEPOT) 45 MG injection Inject 45 mg into the muscle every 6 (six) months.    Marland Kitchen lisinopril (PRINIVIL,ZESTRIL) 20 MG tablet Take 1 tablet (20 mg total) daily by mouth. (Patient taking differently: Take 20 mg by mouth every morning. ) 30 tablet 11  . metFORMIN (GLUCOPHAGE) 850 MG tablet Take 850 mg by mouth 2 (two) times daily with a meal.     . Multiple Vitamin (MULTIVITAMIN WITH MINERALS) TABS tablet Take 1 tablet by mouth every morning.    . NON FORMULARY Katragadda 2 caps daily (500mg )    . predniSONE (DELTASONE) 2.5 MG tablet Take 2.5 mg by mouth 2 (two) times daily with a meal.    . warfarin (COUMADIN) 2.5 MG tablet Take 2 - 3 tablets daily as directed by the anticoagulation clinic (Patient taking differently: Take 2.5-5 mg by mouth See admin instructions. As directed by Coumadin clinic) 75 tablet 3  . furosemide (LASIX) 20 MG tablet Take 1 tablet (20 mg total) by mouth daily. (Patient taking differently: Take 20 mg by mouth every morning. ) 30 tablet 11  . loperamide (IMODIUM) 2 MG capsule Take 2 mg by mouth daily as needed for diarrhea or loose stools.     . nystatin (MYCOSTATIN) powder Apply 1 g topically 4 (four) times daily as needed (fungus).     Marland Kitchen omeprazole (PRILOSEC) 40 MG capsule Take 20 mg by mouth every other day.   3  . potassium chloride (K-DUR,KLOR-CON) 10 MEQ tablet Take 2 tablets (20 mEq total) by mouth daily. (Patient taking differently: Take 10 mEq  by mouth 2 (two) times daily. ) 60 tablet 0  . sennosides-docusate sodium (SENOKOT-S) 8.6-50 MG tablet Take 1 tablet by mouth daily as needed for constipation.     . VENTOLIN HFA 108 (90 Base) MCG/ACT inhaler Inhale 1-2 puffs into the lungs every 6 (six) hours as needed for wheezing or shortness of breath.      No current facility-administered medications on file prior to visit.         Objective:   Physical Exam Blood pressure (!) 142/80, pulse (!) 56, temperature (!)  97.4 F (36.3 C), height 5\' 10"  (1.778 m), weight 150 lb 1.6 oz (68.1 kg). Alert and oriented. Skin warm and dry. Oral mucosa is moist.   . Sclera anicteric, conjunctivae is pink. Thyroid not enlarged. No cervical lymphadenopathy. Lungs clear. Heart regular rate and rhythm.  Abdomen is soft. Bowel sounds are positive. No hepatomegaly. No abdominal masses felt. No tenderness.  No edema to lower extremities.            Assessment & Plan:  Diarrhea, Incontinency of stool. Bedside chair. Fiber daily. May try a bowel schedule. Try Fiber One bars.  Call with PR in 2 weeks.

## 2018-03-30 NOTE — Patient Instructions (Signed)
Take coumadin 7.5mg  tonight and tomorrow night then resume 5mg  daily.  Recheck in 1 week Continue Ensure 2 cans daily (Not drinking consistently now) Started a Multivitamin without Vit K Order given to [employer]

## 2018-04-04 DIAGNOSIS — C8590 Non-Hodgkin lymphoma, unspecified, unspecified site: Secondary | ICD-10-CM | POA: Diagnosis not present

## 2018-04-04 DIAGNOSIS — C7951 Secondary malignant neoplasm of bone: Secondary | ICD-10-CM | POA: Diagnosis not present

## 2018-04-04 DIAGNOSIS — C61 Malignant neoplasm of prostate: Secondary | ICD-10-CM | POA: Diagnosis not present

## 2018-04-04 DIAGNOSIS — E1151 Type 2 diabetes mellitus with diabetic peripheral angiopathy without gangrene: Secondary | ICD-10-CM | POA: Diagnosis not present

## 2018-04-04 DIAGNOSIS — J189 Pneumonia, unspecified organism: Secondary | ICD-10-CM | POA: Diagnosis not present

## 2018-04-04 DIAGNOSIS — I70203 Unspecified atherosclerosis of native arteries of extremities, bilateral legs: Secondary | ICD-10-CM | POA: Diagnosis not present

## 2018-04-06 ENCOUNTER — Ambulatory Visit (INDEPENDENT_AMBULATORY_CARE_PROVIDER_SITE_OTHER): Payer: Medicare Other | Admitting: *Deleted

## 2018-04-06 DIAGNOSIS — Z5181 Encounter for therapeutic drug level monitoring: Secondary | ICD-10-CM

## 2018-04-06 DIAGNOSIS — I480 Paroxysmal atrial fibrillation: Secondary | ICD-10-CM | POA: Diagnosis not present

## 2018-04-06 LAB — POCT INR: INR: 2.8 (ref 2.0–3.0)

## 2018-04-06 NOTE — Patient Instructions (Signed)
Continue coumadin 5mg  daily.  Recheck in 3 week Continue Ensure  (Not drinking consistently now) D/C from [place]

## 2018-04-08 ENCOUNTER — Inpatient Hospital Stay (HOSPITAL_COMMUNITY): Payer: Medicare Other

## 2018-04-08 ENCOUNTER — Inpatient Hospital Stay (HOSPITAL_BASED_OUTPATIENT_CLINIC_OR_DEPARTMENT_OTHER): Payer: Medicare Other | Admitting: Hematology

## 2018-04-08 ENCOUNTER — Other Ambulatory Visit (HOSPITAL_COMMUNITY): Payer: Medicare Other

## 2018-04-08 ENCOUNTER — Telehealth (HOSPITAL_COMMUNITY): Payer: Self-pay | Admitting: Hematology

## 2018-04-08 ENCOUNTER — Inpatient Hospital Stay (HOSPITAL_COMMUNITY): Payer: Medicare Other | Attending: Hematology

## 2018-04-08 ENCOUNTER — Encounter (HOSPITAL_COMMUNITY): Payer: Self-pay | Admitting: Hematology

## 2018-04-08 VITALS — BP 180/89 | HR 78 | Temp 97.7°F | Resp 18 | Wt 153.4 lb

## 2018-04-08 DIAGNOSIS — Z7952 Long term (current) use of systemic steroids: Secondary | ICD-10-CM | POA: Insufficient documentation

## 2018-04-08 DIAGNOSIS — C61 Malignant neoplasm of prostate: Secondary | ICD-10-CM | POA: Diagnosis not present

## 2018-04-08 DIAGNOSIS — Z9049 Acquired absence of other specified parts of digestive tract: Secondary | ICD-10-CM

## 2018-04-08 DIAGNOSIS — Z79818 Long term (current) use of other agents affecting estrogen receptors and estrogen levels: Secondary | ICD-10-CM | POA: Insufficient documentation

## 2018-04-08 DIAGNOSIS — C7951 Secondary malignant neoplasm of bone: Secondary | ICD-10-CM | POA: Diagnosis not present

## 2018-04-08 DIAGNOSIS — Z803 Family history of malignant neoplasm of breast: Secondary | ICD-10-CM

## 2018-04-08 DIAGNOSIS — D472 Monoclonal gammopathy: Secondary | ICD-10-CM | POA: Diagnosis not present

## 2018-04-08 DIAGNOSIS — Z7901 Long term (current) use of anticoagulants: Secondary | ICD-10-CM | POA: Diagnosis not present

## 2018-04-08 DIAGNOSIS — I4891 Unspecified atrial fibrillation: Secondary | ICD-10-CM | POA: Diagnosis not present

## 2018-04-08 DIAGNOSIS — Z7984 Long term (current) use of oral hypoglycemic drugs: Secondary | ICD-10-CM | POA: Insufficient documentation

## 2018-04-08 DIAGNOSIS — R5383 Other fatigue: Secondary | ICD-10-CM | POA: Insufficient documentation

## 2018-04-08 DIAGNOSIS — Z8509 Personal history of malignant neoplasm of other digestive organs: Secondary | ICD-10-CM

## 2018-04-08 DIAGNOSIS — Z8572 Personal history of non-Hodgkin lymphomas: Secondary | ICD-10-CM | POA: Diagnosis not present

## 2018-04-08 DIAGNOSIS — Z85828 Personal history of other malignant neoplasm of skin: Secondary | ICD-10-CM

## 2018-04-08 DIAGNOSIS — E119 Type 2 diabetes mellitus without complications: Secondary | ICD-10-CM | POA: Diagnosis not present

## 2018-04-08 DIAGNOSIS — I10 Essential (primary) hypertension: Secondary | ICD-10-CM

## 2018-04-08 DIAGNOSIS — Z800 Family history of malignant neoplasm of digestive organs: Secondary | ICD-10-CM | POA: Diagnosis not present

## 2018-04-08 DIAGNOSIS — Z79899 Other long term (current) drug therapy: Secondary | ICD-10-CM | POA: Diagnosis not present

## 2018-04-08 DIAGNOSIS — R42 Dizziness and giddiness: Secondary | ICD-10-CM

## 2018-04-08 LAB — CBC WITH DIFFERENTIAL/PLATELET
Abs Immature Granulocytes: 0.21 10*3/uL — ABNORMAL HIGH (ref 0.00–0.07)
Basophils Absolute: 0 10*3/uL (ref 0.0–0.1)
Basophils Relative: 0 %
Eosinophils Absolute: 0 10*3/uL (ref 0.0–0.5)
Eosinophils Relative: 1 %
HCT: 40 % (ref 39.0–52.0)
Hemoglobin: 13.4 g/dL (ref 13.0–17.0)
Immature Granulocytes: 4 %
Lymphocytes Relative: 38 %
Lymphs Abs: 2.2 10*3/uL (ref 0.7–4.0)
MCH: 30.9 pg (ref 26.0–34.0)
MCHC: 33.5 g/dL (ref 30.0–36.0)
MCV: 92.4 fL (ref 80.0–100.0)
Monocytes Absolute: 0.8 10*3/uL (ref 0.1–1.0)
Monocytes Relative: 14 %
Neutro Abs: 2.6 10*3/uL (ref 1.7–7.7)
Neutrophils Relative %: 43 %
Platelets: 143 10*3/uL — ABNORMAL LOW (ref 150–400)
RBC: 4.33 MIL/uL (ref 4.22–5.81)
RDW: 13.8 % (ref 11.5–15.5)
WBC: 5.8 10*3/uL (ref 4.0–10.5)
nRBC: 0 % (ref 0.0–0.2)

## 2018-04-08 LAB — COMPREHENSIVE METABOLIC PANEL
ALT: 15 U/L (ref 0–44)
AST: 17 U/L (ref 15–41)
Albumin: 4.1 g/dL (ref 3.5–5.0)
Alkaline Phosphatase: 71 U/L (ref 38–126)
Anion gap: 8 (ref 5–15)
BUN: 18 mg/dL (ref 8–23)
CO2: 29 mmol/L (ref 22–32)
Calcium: 10.2 mg/dL (ref 8.9–10.3)
Chloride: 101 mmol/L (ref 98–111)
Creatinine, Ser: 0.65 mg/dL (ref 0.61–1.24)
GFR calc Af Amer: >60 mL/min (ref >60)
GFR calc non Af Amer: >60 mL/min (ref >60)
Glucose, Bld: 114 mg/dL — ABNORMAL HIGH (ref 70–99)
Potassium: 4 mmol/L (ref 3.5–5.1)
Sodium: 138 mmol/L (ref 135–145)
Total Bilirubin: 0.7 mg/dL (ref 0.3–1.2)
Total Protein: 7 g/dL (ref 6.5–8.1)

## 2018-04-08 LAB — PSA: Prostatic Specific Antigen: <0.01 ng/mL (ref 0.00–4.00)

## 2018-04-08 MED ORDER — DENOSUMAB 120 MG/1.7ML ~~LOC~~ SOLN
120.0000 mg | Freq: Once | SUBCUTANEOUS | Status: AC
  Administered 2018-04-08: 120 mg via SUBCUTANEOUS
  Filled 2018-04-08: qty 1.7

## 2018-04-08 NOTE — Assessment & Plan Note (Addendum)
1.  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 -Started on Abiraterone and prednisone in October 2016, tolerating very well.  He is on every 51-month Lupron injections.  Last Lupron injection was on 09/06/2017.  Last PSA was down to 0.01 on the same day.  Last bone scan dated 08/02/2017  shows interval improvement in T12 lesion.  No new lesions were seen. -He was hospitalized from 01/31/2018 through 02/03/2018 with mental status changes and was treated for aspiration pneumonia.  - Abiraterone dose was reduced to 500 mg once a day and prednisone to 2.5 mg twice daily on 02/10/2018. -Last Lupron 45 mg was on 03/11/2018. - His energy levels have improved in the last 10 days. -We have reviewed his blood work.  PSA today was pending.  Last PSA was still undetectable. -I will switch him to every 91-month visits.  2.  Bone metastasis: His calcium was within normal limits.  We will continue denosumab every 4 weeks.  3.  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  4.  DL BCL: He was originally diagnosed in 2005.  He does not have any symptoms or lymphadenopathy on the scan.  5.  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  No evidence of recurrence.

## 2018-04-08 NOTE — Progress Notes (Signed)
[name] presents today for injection per the provider's orders.  Xgeva administration without incident; see MAR for injection details.  Patient tolerated procedure well and without incident.  No questions or complaints noted at this time.  Discharged ambulatory in c/o daughter.

## 2018-04-08 NOTE — Patient Instructions (Addendum)
[facility] Discharge Instructions  Follow up in 2 months with labs   Thank you for choosing [facility] at [facility] to provide your oncology and hematology care.  To afford each patient quality time with our provider, please arrive at least 15 minutes before your scheduled appointment time.   If you have a lab appointment with the Cancer Center please come in thru the  Main Entrance and check in at the main information desk  You need to re-schedule your appointment should you arrive 10 or more minutes late.  We strive to give you quality time with our providers, and arriving late affects you and other patients whose appointments are after yours.  Also, if you no show three or more times for appointments you may be dismissed from the clinic at the providers discretion.     Again, thank you for choosing [facility].  Our hope is that these requests will decrease the amount of time that you wait before being seen by our physicians.       _____________________________________________________________  Should you have questions after your visit to [facility], please contact our office at [phone] between the hours of 8:00 a.m. and 4:30 p.m.  Voicemails left after 4:00 p.m. will not be returned until the following business day.  For prescription refill requests, have your pharmacy contact our office and allow 72 hours.    Cancer Center Support Programs:   > Cancer Support Group  2nd Tuesday of the month 1pm-2pm, Journey Room    

## 2018-04-08 NOTE — Progress Notes (Signed)
[name], [place] [id number]   CLINIC:  Medical Oncology/Hematology  PCP:  Rosita Fire, MD [place] [address] [phone]   REASON FOR VISIT: Follow-up for metastatic prostate cancer to the bones  CURRENT THERAPY: Abiraterone (Zytiga) and prednisone  BRIEF ONCOLOGIC HISTORY:    GIST (gastrointestinal stromal tumor), malignant ([place])   08/18/2011 Initial Diagnosis    GIST (gastrointestinal stromal tumor), malignant ([place])    02/08/2017 Genetic Testing    ATM Gain (Exons 62-63) VUS identified on the multi-gene panel.  The Multi-Gene Panel offered by [facility] includes sequencing and/or deletion duplication testing of the following 80 genes: ALK, APC, ATM, AXIN2,BAP1,  BARD1, BLM, BMPR1A, BRCA1, BRCA2, BRIP1, CASR, CDC73, CDH1, CDK4, CDKN1B, CDKN1C, CDKN2A (p14ARF), CDKN2A (p16INK4a), CEBPA, CHEK2, CTNNA1, DICER1, DIS3L2, EGFR (c.2369C>T, p.Thr790Met variant only), EPCAM (Deletion/duplication testing only), FH, FLCN, GATA2, GPC3, GREM1 (Promoter region deletion/duplication testing only), HOXB13 (c.251G>A, p.Gly84Glu), HRAS, KIT, MAX, MEN1, MET, MITF (c.952G>A, p.Glu318Lys variant only), MLH1, MSH2, MSH3, MSH6, MUTYH, NBN, NF1, NF2, NTHL1, PALB2, PDGFRA, PHOX2B, PMS2, POLD1, POLE, POT1, PRKAR1A, PTCH1, PTEN, RAD50, RAD51C, RAD51D, RB1, RECQL4, RET, RUNX1, SDHAF2, SDHA (sequence changes only), SDHB, SDHC, SDHD, SMAD4, SMARCA4, SMARCB1, SMARCE1, STK11, SUFU, TERT, TERT, TMEM127, TP53, TSC1, TSC2, VHL, WRN and WT1.  The report date is February 08, 2017.      Prostate cancer ([facility])   09/08/2013 Initial Diagnosis    Prostate cancer ([place])    02/08/2017 Genetic Testing    ATM Gain (Exons 62-63) VUS identified on the multi-gene panel.  The Multi-Gene Panel offered by [facility] includes sequencing and/or deletion duplication testing of the following 80 genes: ALK, APC, ATM, AXIN2,BAP1,  BARD1, BLM, BMPR1A, BRCA1, BRCA2, BRIP1, CASR,  CDC73, CDH1, CDK4, CDKN1B, CDKN1C, CDKN2A (p14ARF), CDKN2A (p16INK4a), CEBPA, CHEK2, CTNNA1, DICER1, DIS3L2, EGFR (c.2369C>T, p.Thr790Met variant only), EPCAM (Deletion/duplication testing only), FH, FLCN, GATA2, GPC3, GREM1 (Promoter region deletion/duplication testing only), HOXB13 (c.251G>A, p.Gly84Glu), HRAS, KIT, MAX, MEN1, MET, MITF (c.952G>A, p.Glu318Lys variant only), MLH1, MSH2, MSH3, MSH6, MUTYH, NBN, NF1, NF2, NTHL1, PALB2, PDGFRA, PHOX2B, PMS2, POLD1, POLE, POT1, PRKAR1A, PTCH1, PTEN, RAD50, RAD51C, RAD51D, RB1, RECQL4, RET, RUNX1, SDHAF2, SDHA (sequence changes only), SDHB, SDHC, SDHD, SMAD4, SMARCA4, SMARCB1, SMARCE1, STK11, SUFU, TERT, TERT, TMEM127, TP53, TSC1, TSC2, VHL, WRN and WT1.  The report date is February 08, 2017.       CANCER STAGING: Cancer Staging NHL (non-Hodgkin's lymphoma) (HCC) Staging form: Lymphoid Neoplasms, AJCC 6th Edition - Clinical: Stage III - Signed by Baird Cancer, PA on 08/18/2011    INTERVAL HISTORY:  [name] 84 y.o. male returns for routine follow-up for metastatic prostate cancer to the bones. He is having some mild dizziness. He walks with his walker for stability. His appetite has increased and he is still drinking his ensure daily. Denies any nausea, vomiting, or diarrhea. Denies any new pains. Had not noticed any recent bleeding such as epistaxis, hematuria or hematochezia. Denies recent chest pain on exertion, shortness of breath on minimal exertion, pre-syncopal episodes, or palpitations. Denies any numbness or tingling in hands or feet. Denies any recent fevers, infections, or recent hospitalizations. He reports his appetite and energy level at 50%.    REVIEW OF SYSTEMS:  Review of Systems  Constitutional: Positive for fatigue.  Neurological: Positive for dizziness.  All other systems reviewed and are negative.    PAST MEDICAL/SURGICAL HISTORY:  Past Medical History:  Diagnosis Date  . Acute bronchitis   . Arthritis   .  Atrial  fibrillation ([facility])    Dr. Harl Bowie- [facility] follows saw 11'14  . Bladder stones    tx. with oral meds and antibiotics, now surgery planned  . Cataracts, both eyes    surgery planned May 2015  . Diabetes mellitus without complication ([facility])    Type II  . Dyspnea    with activity  . Dysrhythmia    Afib  . Family history of breast cancer   . Family history of colon cancer   . GERD (gastroesophageal reflux disease)   . GIST (gastrointestinal stromal tumor), malignant ([facility]) 2005   Gastrointestinal stromal tumor, that is GIST, small bowel, 4.5 cm, intermediate prognostic grade found on the PET scan in the small bowel, accounting for that small bowel activity in October 2005 with resection by Dr. Margot Chimes, thus far without recurrence.   . Hypertension   . NHL (non-Hodgkin's lymphoma) ([facility]) 2005   Diffuse large B-cell lymphoma, clinically stage IIIA, CD20 positive, status post cervical lymph node biopsy 08/03/2003 on the left. PET scan was also positive in the spleen and small bowel region, but bone marrow aspiration and biopsy were negative. So he essentially had stage IIIAs. He received R-CHOP x6 cycles with CR established by PET scan criteria on 11/23/2003 with no evidence for relapse th  . PAD (peripheral artery disease) ([facility]) 08/27/2017  . Skin cancer    Basal cell- face,head, neck,  arms, legs, Back   Past Surgical History:  Procedure Laterality Date  . AMPUTATION Left 06/29/2017   Procedure: AMPUTATION LEFT GREAT TOE;  Surgeon: [name], MD;  Location: [facility];  Service: Vascular;  Laterality: Left;  . AMPUTATION Left 08/27/2017   Procedure: AMPUTATION Left SECOND TOE;  Surgeon: [name], MD;  Location: [facility];  Service: Vascular;  Laterality: Left;  . CATARACT EXTRACTION Bilateral 2015  . CHOLECYSTECTOMY    . COLON RESECTION     small bowel  . CYSTOSCOPY WITH LITHOLAPAXY N/A 08/10/2013   Procedure: CYSTOSCOPY WITH LITHOLAPAXY WITH [name];  Surgeon: [name],  MD;  Location: [facility];  Service: Urology;  Laterality: N/A;  . ESOPHAGEAL DILATION N/A 02/27/2016   Procedure: ESOPHAGEAL DILATION;  Surgeon: [name], MD;  [facility];  Service: Endoscopy;  Laterality: N/A;  . ESOPHAGOGASTRODUODENOSCOPY N/A 02/27/2016   Procedure: ESOPHAGOGASTRODUODENOSCOPY (EGD);  Surgeon: [name], MD;  [facility];  Service: Endoscopy;  Laterality: N/A;  12:00  . INCISION AND DRAINAGE PERIRECTAL ABSCESS    . LOWER EXTREMITY ANGIOGRAPHY N/A 06/25/2017   Procedure: LOWER EXTREMITY ANGIOGRAPHY;  Surgeon: [name], MD;  Location: [facility];  Service: Cardiovascular;  Laterality: N/A;  . LYMPH NODE DISSECTION Left    '05-neck  . PORT-A-CATH REMOVAL    . PORTACATH PLACEMENT     insertion and removal -last chemotherapy 10 yrs ago  . TRANSURETHRAL RESECTION OF PROSTATE N/A 08/10/2013   Procedure: TRANSURETHRAL RESECTION OF THE PROSTATE WITH GYRUS INSTRUMENTS;  Surgeon: [name], MD;  Location: [facility];  Service: Urology;  Laterality: N/A;     SOCIAL HISTORY:  Social History   Socioeconomic History  . Marital status: Married    Spouse name: Not on file  . Number of children: Not on file  . Years of education: Not on file  . Highest education level: Not on file  Occupational History  . Occupation: retired    Fish farm manager: RETIRED    Comment: salesman  Social Needs  . Financial resource strain: Not  on file  . Food insecurity:    Worry: Not on file    Inability: Not on file  . Transportation needs:    Medical: Not on file    Non-medical: Not on file  Tobacco Use  . Smoking status: Never Smoker  . Smokeless tobacco: Never Used  Substance and Sexual Activity  . Alcohol use: No  . Drug use: No  . Sexual activity: Not Currently  Lifestyle  . Physical activity:    Days per week: Not on file    Minutes per session: Not on file  . Stress: Not on file  Relationships  . Social connections:    Talks on phone:  Not on file    Gets together: Not on file    Attends religious service: Not on file    Active member of club or organization: Not on file    Attends meetings of clubs or organizations: Not on file    Relationship status: Not on file  . Intimate partner violence:    Fear of current or ex partner: Not on file    Emotionally abused: Not on file    Physically abused: Not on file    Forced sexual activity: Not on file  Other Topics Concern  . Not on file  Social History Narrative  . Not on file    FAMILY HISTORY:  Family History  Problem Relation Age of Onset  . Heart attack Mother        died in her 50s  . Other Father 19       pneumonia - died  . Breast cancer Sister        dx in her 46s-60s  . Colon cancer Brother        dx in his 52s  . Cancer Sister        TAH/BSO due to "male cancer"  . Breast cancer Daughter 72  . Breast cancer Daughter 30       DCIS - ATM VUS on [facility] 83 gene panel in 2018  . Breast cancer Daughter 58       DCIS - Negative on [facility] My[facility] 25 gene apnel in 2015  . Thyroid cancer Daughter 30       Medullary and Papillary  . Thyroid nodules Grandchild     CURRENT MEDICATIONS:  Outpatient Encounter Medications as of 04/08/2018  Medication Sig Note  . Abiraterone Acetate (ZYTIGA PO) Take by mouth.   Marland Kitchen abiraterone acetate (ZYTIGA) 250 MG tablet Take 2 tablets (500 mg total) by mouth daily. Take on an empty stomach 1 hour before or 2 hours after a meal   . acetaminophen (TYLENOL) 500 MG tablet Take 500-1,000 mg by mouth daily as needed for mild pain, fever or headache.   . Calcium Carb-Cholecalciferol (CALCIUM 600+D) 600-800 MG-UNIT TABS Take 1 tablet by mouth 2 (two) times daily.   . Cholecalciferol (VITAMIN D3) 1000 units CAPS Take 1 capsule by mouth every morning.   . Cranberry-Vitamin C-Vitamin E (CRANBERRY PLUS VITAMIN C) 4200-20-3 MG-MG-UNIT CAPS Take 1 capsule by mouth every morning.   . Cyanocobalamin (VITAMIN B-12 IJ) Inject 100 mcg as  directed every 30 (thirty) days.   Marland Kitchen denosumab (XGEVA) 120 MG/1.7ML SOLN injection Inject 120 mg into the skin every 30 (thirty) days.  02/08/2018: Due for injection on 02/10/2018 following blood work  . diltiazem (CARDIZEM) 90 MG tablet TAKE 1 TABLET BY MOUTH TWICE DAILY -- **NEEDS APPOINTMENT FOR FURTHER REFILLS** (Patient taking differently: Take 90 mg  by mouth 2 (two) times daily. )   . diphenhydrAMINE-APAP, sleep, (TYLENOL PM EXTRA STRENGTH PO) Take by mouth at bedtime.   . donepezil (ARICEPT) 5 MG tablet TAKE 1 TABLET BY MOUTH ONCE DAILY FOR 1 MONTH THEN FILL THE 10 MG PRESCRIPTION   . donepezil (ARICEPT) 5 MG tablet Take 5 mg by mouth at bedtime.   . furosemide (LASIX) 20 MG tablet Take 20 mg by mouth.   Marland Kitchen glipiZIDE (GLUCOTROL XL) 5 MG 24 hr tablet Take 5 mg by mouth daily with breakfast.    . Leuprolide Acetate, 6 Month, (LUPRON DEPOT) 45 MG injection Inject 45 mg into the muscle every 6 (six) months.   Marland Kitchen lisinopril (PRINIVIL,ZESTRIL) 20 MG tablet Take 1 tablet (20 mg total) daily by mouth. (Patient taking differently: Take 20 mg by mouth every morning. )   . loperamide (IMODIUM) 2 MG capsule Take 2 mg by mouth daily as needed for diarrhea or loose stools.    . metFORMIN (GLUCOPHAGE) 850 MG tablet Take 850 mg by mouth 2 (two) times daily with a meal.    . Multiple Vitamin (MULTIVITAMIN WITH MINERALS) TABS tablet Take 1 tablet by mouth every morning.   . NON FORMULARY Katragadda 2 caps daily ('500mg'$ )   . nystatin (MYCOSTATIN) powder Apply 1 g topically 4 (four) times daily as needed (fungus).    Marland Kitchen omeprazole (PRILOSEC) 40 MG capsule Take 20 mg by mouth every other day.    . potassium chloride (K-DUR,KLOR-CON) 10 MEQ tablet Take 2 tablets (20 mEq total) by mouth daily. (Patient taking differently: Take 10 mEq by mouth 2 (two) times daily. )   . predniSONE (DELTASONE) 5 MG tablet TAKE 1 2 (ONE HALF) TABLET BY MOUTH TWICE DAILY WITH A MEAL   . sennosides-docusate sodium (SENOKOT-S) 8.6-50 MG  tablet Take 1 tablet by mouth daily as needed for constipation.    . VENTOLIN HFA 108 (90 Base) MCG/ACT inhaler Inhale 1-2 puffs into the lungs every 6 (six) hours as needed for wheezing or shortness of breath.    . warfarin (COUMADIN) 2.5 MG tablet Take 2 - 3 tablets daily as directed by the anticoagulation clinic (Patient taking differently: Take 2.5-5 mg by mouth See admin instructions. As directed by Coumadin clinic) 02/08/2018: Coumadin levels were checked on 02/07/2018 and were at 4.1. Coumadin was held. Patient is due to take '5mg'$  on 02/08/2018 and have INR levels rechecked on 02/09/2018  . furosemide (LASIX) 20 MG tablet Take 1 tablet (20 mg total) by mouth daily. (Patient taking differently: Take 20 mg by mouth every morning. )   . [DISCONTINUED] predniSONE (DELTASONE) 2.5 MG tablet Take 2.5 mg by mouth 2 (two) times daily with a meal.    No facility-administered encounter medications on file as of 04/08/2018.     ALLERGIES:  Allergies  Allergen Reactions  . Augmentin [Amoxicillin-Pot Clavulanate] Rash    Redness of skin     PHYSICAL EXAM:  ECOG Performance status: 1  Vitals:   04/08/18 1000  BP: (!) 180/89  Pulse: 78  Resp: 18  Temp: 97.7 F (36.5 C)  SpO2: 100%   Filed Weights   04/08/18 1000  Weight: 153 lb 6.4 oz (69.6 kg)    Physical Exam Constitutional:      Appearance: Normal appearance. He is normal weight.  Cardiovascular:     Rate and Rhythm: Normal rate and regular rhythm.     Heart sounds: Normal heart sounds.  Pulmonary:     Effort: Pulmonary effort  is normal.     Breath sounds: Normal breath sounds.  Musculoskeletal:     Comments: Uses walker  Skin:    General: Skin is warm and dry.  Neurological:     Mental Status: He is alert and oriented to person, place, and time. Mental status is at baseline.  Psychiatric:        Mood and Affect: Mood normal.        Behavior: Behavior normal.        Thought Content: Thought content normal.         Judgment: Judgment normal.      LABORATORY DATA:  I have reviewed the labs as listed.  CBC    Component Value Date/Time   WBC 5.8 04/08/2018 0915   RBC 4.33 04/08/2018 0915   HGB 13.4 04/08/2018 0915   HCT 40.0 04/08/2018 0915   PLT 143 (L) 04/08/2018 0915   MCV 92.4 04/08/2018 0915   MCH 30.9 04/08/2018 0915   MCHC 33.5 04/08/2018 0915   RDW 13.8 04/08/2018 0915   LYMPHSABS 2.2 04/08/2018 0915   MONOABS 0.8 04/08/2018 0915   EOSABS 0.0 04/08/2018 0915   BASOSABS 0.0 04/08/2018 0915   CMP Latest Ref Rng & Units 04/08/2018 03/09/2018 02/10/2018  Glucose 70 - 99 mg/dL 114(H) 137(H) 178(H)  BUN 8 - 23 mg/dL _0 Creatinine 0.61 - 1.24 mg/dL 0.65 0.70 0.84  Sodium 135 - 145 mmol/L 138 139 138  Potassium 3.5 - 5.1 mmol/L 4.0 3.5 3.8  Chloride 98 - 111 mmol/L 101 104 103  CO2 22 - 32 mmol/L _1 Calcium 8.9 - 10.3 mg/dL 10.2 10.2 9.7  Total Protein 6.5 - 8.1 g/dL 7.0 6.9 6.9  Total Bilirubin 0.3 - 1.2 mg/dL 0.7 0.7 0.6  Alkaline Phos 38 - 126 U/L 71 69 64  AST 15 - 41 U/L _2 ALT 0 - 44 U/L _3 DIAGNOSTIC IMAGING:  I have independently reviewed the scans and discussed with the patient.   I have reviewed Francene Finders, NP's note and agree with the documentation.  I personally performed a face-to-face visit, made revisions and my assessment and plan is as follows.    ASSESSMENT & PLAN:   Prostate cancer ([facility]) 1.  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 -Started on Abiraterone and prednisone in October 2016, tolerating very well.  He is on every 25-monthLupron injections.  Last Lupron injection was on 09/06/2017.  Last PSA was down to 0.01 on the same day.  Last bone scan dated 08/02/2017  shows interval improvement in T12 lesion.  No new lesions were seen. -He was hospitalized from 01/31/2018 through 02/03/2018 with mental status changes and was treated for aspiration pneumonia.  - Abiraterone dose was reduced  to 500 mg once a day and prednisone to 2.5 mg twice daily on 02/10/2018. -Last Lupron 45 mg was on 03/11/2018. - His energy levels have improved in the last 10 days. -We have reviewed his blood work.  PSA today was pending.  Last PSA was still undetectable. -I will switch him to every 244-monthisits.  2.  Bone metastasis: His calcium was within normal limits.  We will continue denosumab every 4 weeks.  3.  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  4.  DL BCL: He was originally diagnosed in 2005.  He does not have any symptoms or lymphadenopathy on  the scan.  5.  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  No evidence of recurrence.      Orders placed this encounter:  Orders Placed This Encounter  Procedures  . PSA  . CBC with Differential/Platelet  . Comprehensive metabolic panel      [name], MD [place] [phone]

## 2018-04-08 NOTE — Telephone Encounter (Signed)
FAXED ASSIST APP TO [facility]

## 2018-04-16 ENCOUNTER — Observation Stay (HOSPITAL_COMMUNITY)
Admission: EM | Admit: 2018-04-16 | Discharge: 2018-04-18 | Disposition: A | Discharge status: Home / Self Care | Payer: Medicare Other | Attending: Internal Medicine | Admitting: Internal Medicine

## 2018-04-16 ENCOUNTER — Ambulatory Visit (HOSPITAL_COMMUNITY)
Admission: EM | Admit: 2018-04-16 | Discharge: 2018-04-16 | Disposition: A | Discharge status: Home / Self Care | Payer: Medicare Other | Source: Home / Self Care

## 2018-04-16 ENCOUNTER — Emergency Department (HOSPITAL_COMMUNITY): Payer: Medicare Other

## 2018-04-16 ENCOUNTER — Encounter (HOSPITAL_COMMUNITY): Payer: Self-pay

## 2018-04-16 DIAGNOSIS — R4182 Altered mental status, unspecified: Secondary | ICD-10-CM | POA: Diagnosis present

## 2018-04-16 DIAGNOSIS — F0390 Unspecified dementia without behavioral disturbance: Secondary | ICD-10-CM | POA: Insufficient documentation

## 2018-04-16 DIAGNOSIS — C61 Malignant neoplasm of prostate: Secondary | ICD-10-CM | POA: Insufficient documentation

## 2018-04-16 DIAGNOSIS — E1169 Type 2 diabetes mellitus with other specified complication: Secondary | ICD-10-CM

## 2018-04-16 DIAGNOSIS — E860 Dehydration: Secondary | ICD-10-CM

## 2018-04-16 DIAGNOSIS — R05 Cough: Secondary | ICD-10-CM | POA: Diagnosis present

## 2018-04-16 DIAGNOSIS — C7951 Secondary malignant neoplasm of bone: Secondary | ICD-10-CM | POA: Diagnosis not present

## 2018-04-16 DIAGNOSIS — K219 Gastro-esophageal reflux disease without esophagitis: Secondary | ICD-10-CM | POA: Insufficient documentation

## 2018-04-16 DIAGNOSIS — R918 Other nonspecific abnormal finding of lung field: Secondary | ICD-10-CM | POA: Diagnosis not present

## 2018-04-16 DIAGNOSIS — G9341 Metabolic encephalopathy: Secondary | ICD-10-CM

## 2018-04-16 DIAGNOSIS — I10 Essential (primary) hypertension: Secondary | ICD-10-CM | POA: Diagnosis not present

## 2018-04-16 DIAGNOSIS — G9340 Encephalopathy, unspecified: Secondary | ICD-10-CM

## 2018-04-16 DIAGNOSIS — R2681 Unsteadiness on feet: Secondary | ICD-10-CM | POA: Insufficient documentation

## 2018-04-16 DIAGNOSIS — I4891 Unspecified atrial fibrillation: Secondary | ICD-10-CM

## 2018-04-16 DIAGNOSIS — J189 Pneumonia, unspecified organism: Secondary | ICD-10-CM | POA: Diagnosis not present

## 2018-04-16 DIAGNOSIS — E119 Type 2 diabetes mellitus without complications: Secondary | ICD-10-CM

## 2018-04-16 DIAGNOSIS — J181 Lobar pneumonia, unspecified organism: Secondary | ICD-10-CM

## 2018-04-16 DIAGNOSIS — R197 Diarrhea, unspecified: Secondary | ICD-10-CM | POA: Diagnosis present

## 2018-04-16 DIAGNOSIS — C8590 Non-Hodgkin lymphoma, unspecified, unspecified site: Secondary | ICD-10-CM | POA: Diagnosis not present

## 2018-04-16 DIAGNOSIS — I739 Peripheral vascular disease, unspecified: Secondary | ICD-10-CM | POA: Insufficient documentation

## 2018-04-16 DIAGNOSIS — Z794 Long term (current) use of insulin: Secondary | ICD-10-CM | POA: Insufficient documentation

## 2018-04-16 DIAGNOSIS — G629 Polyneuropathy, unspecified: Secondary | ICD-10-CM

## 2018-04-16 DIAGNOSIS — Z7901 Long term (current) use of anticoagulants: Secondary | ICD-10-CM | POA: Diagnosis not present

## 2018-04-16 DIAGNOSIS — Z79899 Other long term (current) drug therapy: Secondary | ICD-10-CM | POA: Diagnosis not present

## 2018-04-16 HISTORY — DX: Unspecified dementia, unspecified severity, without behavioral disturbance, psychotic disturbance, mood disturbance, and anxiety: F03.90

## 2018-04-16 HISTORY — DX: Metabolic encephalopathy: G93.41

## 2018-04-16 HISTORY — DX: Dehydration: E86.0

## 2018-04-16 HISTORY — DX: Pneumonia, unspecified organism: J18.9

## 2018-04-16 LAB — CBC WITH DIFFERENTIAL/PLATELET
Abs Immature Granulocytes: 0.8 10*3/uL — ABNORMAL HIGH (ref 0.00–0.07)
Basophils Absolute: 0 10*3/uL (ref 0.0–0.1)
Basophils Relative: 0 %
Eosinophils Absolute: 0 10*3/uL (ref 0.0–0.5)
Eosinophils Relative: 0 %
HCT: 36.9 % — ABNORMAL LOW (ref 39.0–52.0)
Hemoglobin: 12.6 g/dL — ABNORMAL LOW (ref 13.0–17.0)
Immature Granulocytes: 6 %
Lymphocytes Relative: 9 %
Lymphs Abs: 1.1 10*3/uL (ref 0.7–4.0)
MCH: 31 pg (ref 26.0–34.0)
MCHC: 34.1 g/dL (ref 30.0–36.0)
MCV: 90.9 fL (ref 80.0–100.0)
Monocytes Absolute: 1.3 10*3/uL — ABNORMAL HIGH (ref 0.1–1.0)
Monocytes Relative: 10 %
Neutro Abs: 9.4 10*3/uL — ABNORMAL HIGH (ref 1.7–7.7)
Neutrophils Relative %: 75 %
Platelets: 155 10*3/uL (ref 150–400)
RBC: 4.06 MIL/uL — ABNORMAL LOW (ref 4.22–5.81)
RDW: 13.7 % (ref 11.5–15.5)
WBC: 12.7 10*3/uL — ABNORMAL HIGH (ref 4.0–10.5)
nRBC: 0 % (ref 0.0–0.2)

## 2018-04-16 LAB — URINALYSIS, ROUTINE W REFLEX MICROSCOPIC
Bilirubin Urine: NEGATIVE
Glucose, UA: NEGATIVE mg/dL
Ketones, ur: NEGATIVE mg/dL
Leukocytes, UA: NEGATIVE
Nitrite: NEGATIVE
Protein, ur: NEGATIVE mg/dL
Specific Gravity, Urine: 1.012 (ref 1.005–1.030)
pH: 5 (ref 5.0–8.0)

## 2018-04-16 LAB — COMPREHENSIVE METABOLIC PANEL
ALT: 16 U/L (ref 0–44)
AST: 17 U/L (ref 15–41)
Albumin: 3.4 g/dL — ABNORMAL LOW (ref 3.5–5.0)
Alkaline Phosphatase: 47 U/L (ref 38–126)
Anion gap: 10 (ref 5–15)
BUN: 15 mg/dL (ref 8–23)
CO2: 25 mmol/L (ref 22–32)
Calcium: 10.3 mg/dL (ref 8.9–10.3)
Chloride: 106 mmol/L (ref 98–111)
Creatinine, Ser: 0.93 mg/dL (ref 0.61–1.24)
GFR calc Af Amer: >60 mL/min (ref >60)
GFR calc non Af Amer: >60 mL/min (ref >60)
Glucose, Bld: 189 mg/dL — ABNORMAL HIGH (ref 70–99)
Potassium: 3.9 mmol/L (ref 3.5–5.1)
Sodium: 141 mmol/L (ref 135–145)
Total Bilirubin: 1.3 mg/dL — ABNORMAL HIGH (ref 0.3–1.2)
Total Protein: 5.9 g/dL — ABNORMAL LOW (ref 6.5–8.1)

## 2018-04-16 LAB — PROTIME-INR
INR: 2.5
Prothrombin Time: 26.7 seconds — ABNORMAL HIGH (ref 11.4–15.2)

## 2018-04-16 LAB — GLUCOSE, CAPILLARY: Glucose-Capillary: 117 mg/dL — ABNORMAL HIGH (ref 70–99)

## 2018-04-16 MED ORDER — ONDANSETRON HCL 4 MG PO TABS: 4.0000 mg | ORAL_TABLET | Freq: Four times a day (QID) | ORAL | Status: DC | PRN

## 2018-04-16 MED ORDER — SODIUM CHLORIDE 0.9 % IV SOLN
500.0000 mg | Freq: Once | INTRAVENOUS | Status: DC
  Filled 2018-04-16: qty 500

## 2018-04-16 MED ORDER — WARFARIN - PHARMACIST DOSING INPATIENT: Freq: Every day | Status: DC

## 2018-04-16 MED ORDER — ACETAMINOPHEN 325 MG PO TABS
650.0000 mg | ORAL_TABLET | Freq: Four times a day (QID) | ORAL | Status: DC | PRN
  Administered 2018-04-18: 650 mg via ORAL
  Filled 2018-04-16: qty 2

## 2018-04-16 MED ORDER — SODIUM CHLORIDE 0.9 % IV SOLN
1.0000 g | INTRAVENOUS | Status: DC
  Administered 2018-04-17: 1 g via INTRAVENOUS
  Filled 2018-04-16: qty 10

## 2018-04-16 MED ORDER — LISINOPRIL 20 MG PO TABS
20.0000 mg | ORAL_TABLET | Freq: Every morning | ORAL | Status: DC
  Administered 2018-04-17 – 2018-04-18 (×2): 20 mg via ORAL
  Filled 2018-04-16 (×2): qty 1

## 2018-04-16 MED ORDER — SODIUM CHLORIDE 0.9 % IV SOLN
INTRAVENOUS | Status: DC | PRN
  Administered 2018-04-16 – 2018-04-17 (×2): via INTRAVENOUS

## 2018-04-16 MED ORDER — SODIUM CHLORIDE 0.9 % IV SOLN
1.0000 g | Freq: Once | INTRAVENOUS | Status: AC
  Administered 2018-04-16: 1 g via INTRAVENOUS
  Filled 2018-04-16: qty 10

## 2018-04-16 MED ORDER — LEVALBUTEROL HCL 0.63 MG/3ML IN NEBU: 0.6300 mg | INHALATION_SOLUTION | Freq: Four times a day (QID) | RESPIRATORY_TRACT | Status: DC | PRN

## 2018-04-16 MED ORDER — DILTIAZEM HCL 60 MG PO TABS
90.0000 mg | ORAL_TABLET | Freq: Two times a day (BID) | ORAL | Status: DC
  Administered 2018-04-16 – 2018-04-18 (×4): 90 mg via ORAL
  Filled 2018-04-16 (×4): qty 2

## 2018-04-16 MED ORDER — GUAIFENESIN ER 600 MG PO TB12
600.0000 mg | ORAL_TABLET | Freq: Two times a day (BID) | ORAL | Status: DC
  Administered 2018-04-16 – 2018-04-18 (×4): 600 mg via ORAL
  Filled 2018-04-16 (×4): qty 1

## 2018-04-16 MED ORDER — METRONIDAZOLE IN NACL 5-0.79 MG/ML-% IV SOLN
500.0000 mg | Freq: Three times a day (TID) | INTRAVENOUS | Status: DC
  Administered 2018-04-17 – 2018-04-18 (×5): 500 mg via INTRAVENOUS
  Filled 2018-04-16 (×6): qty 100

## 2018-04-16 MED ORDER — SODIUM CHLORIDE 0.9 % IV SOLN
500.0000 mg | INTRAVENOUS | Status: DC
  Administered 2018-04-16 – 2018-04-17 (×2): 500 mg via INTRAVENOUS
  Filled 2018-04-16 (×2): qty 500

## 2018-04-16 MED ORDER — DONEPEZIL HCL 10 MG PO TABS
10.0000 mg | ORAL_TABLET | Freq: Every day | ORAL | Status: DC
  Administered 2018-04-17 – 2018-04-18 (×2): 10 mg via ORAL
  Filled 2018-04-16 (×2): qty 1

## 2018-04-16 MED ORDER — ACETAMINOPHEN 650 MG RE SUPP: 650.0000 mg | Freq: Four times a day (QID) | RECTAL | Status: DC | PRN

## 2018-04-16 MED ORDER — SODIUM CHLORIDE 0.9 % IV SOLN
INTRAVENOUS | Status: DC | PRN
  Administered 2018-04-16: 20:00:00 via INTRAVENOUS

## 2018-04-16 MED ORDER — SODIUM CHLORIDE 0.9 % IV BOLUS
1000.0000 mL | Freq: Once | INTRAVENOUS | Status: AC
  Administered 2018-04-16: 1000 mL via INTRAVENOUS

## 2018-04-16 MED ORDER — ABIRATERONE ACETATE 250 MG PO TABS
500.0000 mg | ORAL_TABLET | Freq: Every day | ORAL | Status: DC
  Administered 2018-04-17: 500 mg via ORAL

## 2018-04-16 MED ORDER — PREDNISONE 5 MG PO TABS
2.5000 mg | ORAL_TABLET | Freq: Two times a day (BID) | ORAL | Status: DC
  Administered 2018-04-16 – 2018-04-18 (×4): 2.5 mg via ORAL
  Filled 2018-04-16 (×4): qty 1

## 2018-04-16 MED ORDER — WARFARIN SODIUM 5 MG PO TABS
5.0000 mg | ORAL_TABLET | Freq: Once | ORAL | Status: AC
  Administered 2018-04-16: 5 mg via ORAL
  Filled 2018-04-16: qty 1

## 2018-04-16 MED ORDER — PANTOPRAZOLE SODIUM 40 MG PO TBEC
40.0000 mg | DELAYED_RELEASE_TABLET | Freq: Every day | ORAL | Status: DC
  Administered 2018-04-17 – 2018-04-18 (×2): 40 mg via ORAL
  Filled 2018-04-16 (×2): qty 1

## 2018-04-16 MED ORDER — MIRTAZAPINE 15 MG PO TABS
7.5000 mg | ORAL_TABLET | Freq: Every day | ORAL | Status: DC
  Administered 2018-04-16 – 2018-04-17 (×2): 7.5 mg via ORAL
  Filled 2018-04-16 (×2): qty 1

## 2018-04-16 MED ORDER — INSULIN ASPART 100 UNIT/ML ~~LOC~~ SOLN
0.0000 [IU] | SUBCUTANEOUS | Status: DC
  Administered 2018-04-17: 3 [IU] via SUBCUTANEOUS
  Administered 2018-04-17 – 2018-04-18 (×2): 1 [IU] via SUBCUTANEOUS
  Administered 2018-04-18: 3 [IU] via SUBCUTANEOUS
  Administered 2018-04-18: 2 [IU] via SUBCUTANEOUS

## 2018-04-16 MED ORDER — ONDANSETRON HCL 4 MG/2ML IJ SOLN: 4.0000 mg | Freq: Four times a day (QID) | INTRAMUSCULAR | Status: DC | PRN

## 2018-04-16 NOTE — ED Notes (Signed)
Confusion, temp 100 per family, complains of back pain.  Multiple family members with patient.  Spoke to dr hagler about patient agreeable to patient going to ed.  Family agreeable to going to ed.

## 2018-04-16 NOTE — ED Notes (Signed)
Admitting at bedside 

## 2018-04-16 NOTE — ED Triage Notes (Signed)
Pt here with daughter who states the pt has been confused all day, coughing, and strong smell of urine. VSS. LSN last night at 2200. NO neuro deficits.

## 2018-04-16 NOTE — ED Provider Notes (Signed)
[facility] EMERGENCY DEPARTMENT Provider Note   CSN: [id number] Arrival date & time: 04/16/18  1552     History   Chief Complaint Chief Complaint  Patient presents with  . Cough  . Altered Mental Status     Level 5 caveat: Confusion  HPI [name] is a 84 y.o. male.  HPI 84 year old male presents the emergency department with low-grade fever today, cough since yesterday, waxing waning mental status changes today noted by family.  He is brought to the ER for further evaluation.  Decreased oral intake today.  No vomiting.  Episode of diarrhea today.  Nonbloody diarrhea.  History of type 2 diabetes.  Metastatic prostate cancer currently undergoing treatment with prednisone and Zytiga  Past Medical History:  Diagnosis Date  . Acute bronchitis   . Arthritis   . Atrial fibrillation ([place])    Dr. Harl Bowie- [facility] follows saw 11'14  . Bladder stones    tx. with oral meds and antibiotics, now surgery planned  . Cataracts, both eyes    surgery planned May 2015  . Diabetes mellitus without complication ([place])    Type II  . Dyspnea    with activity  . Dysrhythmia    Afib  . Family history of breast cancer   . Family history of colon cancer   . GERD (gastroesophageal reflux disease)   . 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 Margot Chimes, thus far without recurrence.   . Hypertension   . NHL (non-Hodgkin's lymphoma) ([place]) 2005   Diffuse large B-cell lymphoma, clinically stage IIIA, CD20 positive, status post cervical lymph node biopsy 08/03/2003 on the left. PET scan was also positive in the spleen and small bowel region, but bone marrow aspiration and biopsy were negative. So he essentially had stage IIIAs. He received R-CHOP  x6 cycles with CR established by PET scan criteria on 11/23/2003 with no evidence for relapse th  . PAD (peripheral artery disease) ([place]) 08/27/2017  . Skin cancer    Basal cell- face,head, neck,  arms, legs, Back    Patient Active Problem List   Diagnosis Date Noted  . Encephalopathy acute 01/31/2018  . Aspiration pneumonia ([place]) 01/31/2018  . Diarrhea 11/25/2017  . Acute lower UTI 11/25/2017  . PAD (peripheral artery disease) ([place]) 08/27/2017  . Great toe amputation status, left 07/30/2017  . Osteomyelitis of toe of left foot ([place]) 06/29/2017  . Acute bronchitis with bronchospasm 03/16/2017  . Type 2 diabetes mellitus ([place]) 03/16/2017  . Hypomagnesemia 03/16/2017  . 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 Postural dizziness with presyncope 02/19/2017  . Polyneuropathy 02/19/2017  . Numbness 02/19/2017  . Urinary frequency 02/19/2017  . Genetic testing 02/09/2017  . Family history of breast cancer   . Family history of colon cancer   . Other dysphagia 01/21/2016  . Bone metastases ([place]) 08/21/2015  . Prostate cancer ([place]) 09/08/2013  . Hypertrophy of prostate with urinary obstruction and other lower urinary tract symptoms (LUTS) 08/10/2013  . Encounter for therapeutic drug monitoring 06/12/2013  . NHL (non-Hodgkin's lymphoma) ([place]) 08/18/2011  . GIST (gastrointestinal stromal tumor), malignant ([place]) 08/18/2011  . Long term (current) use of anticoagulants 07/18/2010  . Essential hypertension 10/04/2008  . ATRIAL FIBRILLATION 10/04/2008    Past Surgical History:  Procedure  Laterality Date  . AMPUTATION Left 06/29/2017   Procedure: AMPUTATION LEFT GREAT TOE;  Surgeon: [name], MD;  Location: [facility];  Service: Vascular;  Laterality: Left;  . AMPUTATION Left 08/27/2017   Procedure: AMPUTATION Left SECOND TOE;  Surgeon: [name], MD;  Location: [facility];  Service: Vascular;  Laterality: Left;  . CATARACT EXTRACTION Bilateral 2015  . CHOLECYSTECTOMY      . COLON RESECTION     small bowel  . CYSTOSCOPY WITH LITHOLAPAXY N/A 08/10/2013   Procedure: CYSTOSCOPY WITH LITHOLAPAXY WITH [name];  Surgeon: [name], MD;  Location: [facility];  Service: Urology;  Laterality: N/A;  . ESOPHAGEAL DILATION N/A 02/27/2016   Procedure: ESOPHAGEAL DILATION;  Surgeon: [name], MD;  [facility];  Service: Endoscopy;  Laterality: N/A;  . ESOPHAGOGASTRODUODENOSCOPY N/A 02/27/2016   Procedure: ESOPHAGOGASTRODUODENOSCOPY (EGD);  Surgeon: [name], MD;  [facility];  Service: Endoscopy;  Laterality: N/A;  12:00  . INCISION AND DRAINAGE PERIRECTAL ABSCESS    . LOWER EXTREMITY ANGIOGRAPHY N/A 06/25/2017   Procedure: LOWER EXTREMITY ANGIOGRAPHY;  Surgeon: [name], MD;  Location: [facility];  Service: Cardiovascular;  Laterality: N/A;  . LYMPH NODE DISSECTION Left    '05-neck  . PORT-A-CATH REMOVAL    . PORTACATH PLACEMENT     insertion and removal -last chemotherapy 10 yrs ago  . TRANSURETHRAL RESECTION OF PROSTATE N/A 08/10/2013   Procedure: TRANSURETHRAL RESECTION OF THE PROSTATE WITH GYRUS INSTRUMENTS;  Surgeon: [name], MD;  Location: [facility];  Service: Urology;  Laterality: N/A;        Home Medications    Prior to Admission medications   Medication Sig Start Date End Date Taking? Authorizing Provider  Abiraterone Acetate (ZYTIGA PO) Take by mouth.    [provider]  abiraterone acetate (ZYTIGA) 250 MG tablet Take 2 tablets (500 mg total) by mouth daily. Take on an empty stomach 1 hour before or 2 hours after a meal 03/11/18   Lockamy, Randi L, NP-C  acetaminophen (TYLENOL) 500 MG tablet Take 500-1,000 mg by mouth daily as needed for mild pain, fever or headache.    [provider]  Calcium Carb-Cholecalciferol (CALCIUM 600+D) 600-800 MG-UNIT TABS Take 1 tablet by mouth 2 (two) times daily.    [provider]  Cholecalciferol (VITAMIN D3) 1000 units CAPS Take 1  capsule by mouth every morning.    [provider]  Cranberry-Vitamin C-Vitamin E (CRANBERRY PLUS VITAMIN C) 4200-20-3 MG-MG-UNIT CAPS Take 1 capsule by mouth every morning.    [provider]  Cyanocobalamin (VITAMIN B-12 IJ) Inject 100 mcg as directed every 30 (thirty) days.    [provider]  denosumab (XGEVA) 120 MG/1.7ML SOLN injection Inject 120 mg into the skin every 30 (thirty) days.     [provider]  diltiazem (CARDIZEM) 90 MG tablet TAKE 1 TABLET BY MOUTH TWICE DAILY -- **NEEDS APPOINTMENT FOR FURTHER REFILLS** Patient taking differently: Take 90 mg by mouth 2 (two) times daily.  12/03/17   Arnoldo Lenis, MD  diphenhydrAMINE-APAP, sleep, (TYLENOL PM EXTRA STRENGTH PO) Take by mouth at bedtime.    [provider]  donepezil (ARICEPT) 5 MG tablet TAKE 1 TABLET BY MOUTH ONCE DAILY FOR 1 MONTH THEN FILL THE 10 MG PRESCRIPTION 03/09/18   [provider]  donepezil (ARICEPT) 5 MG tablet Take 5 mg by mouth at bedtime.    [provider]  furosemide (LASIX) 20 MG  tablet Take 1 tablet (20 mg total) by mouth daily. Patient taking differently: Take 20 mg by mouth every morning.  03/18/17 03/18/18  Kathie Dike, MD  furosemide (LASIX) 20 MG tablet Take 20 mg by mouth.    [provider]  glipiZIDE (GLUCOTROL XL) 5 MG 24 hr tablet Take 5 mg by mouth daily with breakfast.  06/25/15   [provider]  Leuprolide Acetate, 6 Month, (LUPRON DEPOT) 45 MG injection Inject 45 mg into the muscle every 6 (six) months.    [provider]  lisinopril (PRINIVIL,ZESTRIL) 20 MG tablet Take 1 tablet (20 mg total) daily by mouth. Patient taking differently: Take 20 mg by mouth every morning.  02/25/17   Sater, [name], MD  loperamide (IMODIUM) 2 MG capsule Take 2 mg by mouth daily as needed for diarrhea or loose stools.     [provider]  metFORMIN (GLUCOPHAGE) 850 MG tablet Take 850 mg by mouth 2 (two) times  daily with a meal.  03/23/17   [provider]  Multiple Vitamin (MULTIVITAMIN WITH MINERALS) TABS tablet Take 1 tablet by mouth every morning.    [provider]  NON FORMULARY Katragadda 2 caps daily (544m)    [provider]  nystatin (MYCOSTATIN) powder Apply 1 g topically 4 (four) times daily as needed (fungus).     [provider]  omeprazole (PRILOSEC) 40 MG capsule Take 20 mg by mouth every other day.  12/14/17   [provider]  potassium chloride (K-DUR,KLOR-CON) 10 MEQ tablet Take 2 tablets (20 mEq total) by mouth daily. Patient taking differently: Take 10 mEq by mouth 2 (two) times daily.  06/21/17   Higgs, VMathis Dad MD  predniSONE (DELTASONE) 5 MG tablet TAKE 1 2 (ONE HALF) TABLET BY MOUTH TWICE DAILY WITH A MEAL 03/13/18   [provider]  sennosides-docusate sodium (SENOKOT-S) 8.6-50 MG tablet Take 1 tablet by mouth daily as needed for constipation.     [provider]  VENTOLIN HFA 108 (90 Base) MCG/ACT inhaler Inhale 1-2 puffs into the lungs every 6 (six) hours as needed for wheezing or shortness of breath.  03/08/17   [provider]  warfarin (COUMADIN) 2.5 MG tablet Take 2 - 3 tablets daily as directed by the anticoagulation clinic Patient taking differently: Take 2.5-5 mg by mouth See admin instructions. As directed by Coumadin clinic 12/29/17   BArnoldo Lenis MD    Family History Family History  Problem Relation Age of Onset  . Heart attack Mother        died in her 563s . Other Father 265      pneumonia - died  . Breast cancer Sister        dx in her 526s-60s . Colon cancer Brother        dx in his 737s . Cancer Sister        TAH/BSO due to "male cancer"  . Breast cancer Daughter 620 . Breast cancer Daughter 567      DCIS - ATM VUS on [employer] 83 gene panel in 2018  . Breast cancer Daughter 483      DCIS - Negative on [employer] MyRisk 25 gene apnel in 2015  . Thyroid cancer Daughter 59       Medullary and Papillary  . Thyroid nodules Grandchild     Social History Social History   Tobacco Use  . Smoking status: Never Smoker  . Smokeless  tobacco: Never Used  Substance Use Topics  . Alcohol use: No  . Drug use: No     Allergies   Augmentin [amoxicillin-pot clavulanate]   Review of Systems Review of Systems  All other systems reviewed and are negative.    Physical Exam Updated Vital Signs BP (!) 140/58   Pulse 70   Temp 98.3 F (36.8 C) (Oral)   Resp 17   SpO2 99%   Physical Exam Vitals signs and nursing note reviewed.  Constitutional:      Appearance: He is well-developed.  HENT:     Head: Normocephalic and atraumatic.  Neck:     Musculoskeletal: Normal range of motion.  Cardiovascular:     Rate and Rhythm: Normal rate and regular rhythm.     Heart sounds: Normal heart sounds.  Pulmonary:     Effort: Pulmonary effort is normal. No respiratory distress.     Breath sounds: Normal breath sounds.  Abdominal:     General: There is no distension.     Palpations: Abdomen is soft.     Tenderness: There is no abdominal tenderness.  Musculoskeletal: Normal range of motion.  Skin:    General: Skin is warm and dry.  Neurological:     Mental Status: He is alert and oriented to person, place, and time.  Psychiatric:        Judgment: Judgment normal.      ED Treatments / Results  Labs (all labs ordered are listed, but only abnormal results are displayed) Labs Reviewed  COMPREHENSIVE METABOLIC PANEL - Abnormal; Notable for the following components:      Result Value   Glucose, Bld 189 (*)    Total Protein 5.9 (*)    Albumin 3.4 (*)    Total Bilirubin 1.3 (*)    All other components within normal limits  CBC WITH DIFFERENTIAL/PLATELET - Abnormal; Notable for the following components:   WBC 12.7 (*)    RBC 4.06 (*)    Hemoglobin 12.6 (*)    HCT 36.9 (*)    Neutro Abs 9.4 (*)    Monocytes Absolute 1.3 (*)    Abs Immature Granulocytes 0.80 (*)      All other components within normal limits  URINALYSIS, ROUTINE W REFLEX MICROSCOPIC - Abnormal; Notable for the following components:   APPearance HAZY (*)    Hgb urine dipstick SMALL (*)    Bacteria, UA RARE (*)    All other components within normal limits  CULTURE, BLOOD (ROUTINE X 2)  CULTURE, BLOOD (ROUTINE X 2)  URINE CULTURE    EKG None  Radiology Dg Chest 2 View  Result Date: 04/16/2018 CLINICAL DATA:  Confusion, temp 100 per family, complains of back pain EXAM: CHEST - 2 VIEW COMPARISON:  02/08/2018 FINDINGS: Heart margins are partially obscured. There is atherosclerotic calcification of the thoracic aorta. The in the LEFT LOWER lobe there is patchy infiltrate. Atelectasis or scarring identified at the RIGHT lung base. No pulmonary edema. IMPRESSION: LEFT LOWER lobe infiltrate. Electronically Signed   By: Nolon Nations M.D.   On: 04/16/2018 17:38    Procedures Procedures (including critical care time)  Medications Ordered in ED Medications  cefTRIAXone (ROCEPHIN) 1 g in sodium chloride 0.9 % 100 mL IVPB (has no administration in time range)  azithromycin (ZITHROMAX) 500 mg in sodium chloride 0.9 % 250 mL IVPB (has no administration in time range)  sodium chloride 0.9 % bolus 1,000 mL (0 mLs Intravenous Stopped 04/16/18 1837)  Initial Impression / Assessment and Plan / ED Course  I have reviewed the triage vital signs and the nursing notes.  Pertinent labs & imaging results that were available during my care of the patient were reviewed by me and considered in my medical decision making (see chart for details).     Patient with evidence of left lower lobe pneumonia.  White count 12,000.  Low-grade temp at home today with waxing waning mental status.  Patient will be admitted for community-acquired pneumonia.  Started on Rocephin and azithromycin.  Patient family updated.  Final Clinical Impressions(s) / ED Diagnoses   Final diagnoses:  Community acquired  pneumonia of left lower lobe of lung [facility])    ED Discharge Orders    None       Jola Schmidt, MD 04/16/18 1950

## 2018-04-16 NOTE — ED Notes (Signed)
Placed condom cath on pt 

## 2018-04-16 NOTE — H&P (Addendum)
[name] CWU:[id number] DOB: 1923-07-02 DOA: 04/16/2018     PCP: Rosita Fire, MD   Outpatient Specialists:   CARDS:   Dr. Harl Bowie    Oncology  Dr. Delton Coombes    Patient arrived to ER on 04/16/18 at 1552  Patient coming from: home Lives  With family   Chief Complaint:  Chief Complaint  Patient presents with  . Cough  . Altered Mental Status    HPI: [name] is a 84 y.o. male with medical history significant of metastatic prostate cancer, diabetes mellitus type 2, atrial fibrillation, GERD non-Hodgkin's lymphoma status post 6 cycles of R-CHOP no evidence of relapse, peripheral vascular disease, hypertension, chronic diarrhea  Presented with   Low grade fever last night cough.  He has been getting a little more confused than his baseline according to his family.  He has had decreased p.o. intake no nausea or vomiting there had 1 episode of diarrhea which was nonbloody. Reports he have had loose BM for years. Seems a bit worse in the last year.  Have been very weak not able to walk on his own today. At baseline able to recognize family but not today,   HE have had trouble with aspiration in the past but not recently.  Lost 57 Lb in the past 18 months   Regarding pertinent Chronic problems:    ON prednisone and Zytiga for metastatic prostate cancer bony metastases followed by oncology Admissions for possible aspiration pneumonia in October which pathology evaluated dysphagia 3 diet with thin liquids and aspiration precautions.  History of atrial fibrillation anticoagulated on Coumadin While in ER: Found to have LLL PNA  Community acquired was started on Rocephin and azithromycin Last [facility] in October 2019 preserved EF The following Work up has been ordered so far:  Orders Placed This Encounter  Procedures  . Blood culture (routine x 2)  . Urine culture  . DG Chest 2 View  . Comprehensive metabolic panel  . CBC with Differential  . Urinalysis, Routine w  reflex microscopic  . Saline Lock IV, Maintain IV access  . Consult to hospitalist     Following Medications were ordered in ER: Medications  cefTRIAXone (ROCEPHIN) 1 g in sodium chloride 0.9 % 100 mL IVPB (has no administration in time range)  azithromycin (ZITHROMAX) 500 mg in sodium chloride 0.9 % 250 mL IVPB (has no administration in time range)  sodium chloride 0.9 % bolus 1,000 mL (0 mLs Intravenous Stopped 04/16/18 1837)    Significant initial  Findings: Abnormal Labs Reviewed  COMPREHENSIVE METABOLIC PANEL - Abnormal; Notable for the following components:      Result Value   Glucose, Bld 189 (*)    Total Protein 5.9 (*)    Albumin 3.4 (*)    Total Bilirubin 1.3 (*)    All other components within normal limits  CBC WITH DIFFERENTIAL/PLATELET - Abnormal; Notable for the following components:   WBC 12.7 (*)    RBC 4.06 (*)    Hemoglobin 12.6 (*)    HCT 36.9 (*)    Neutro Abs 9.4 (*)    Monocytes Absolute 1.3 (*)    Abs Immature Granulocytes 0.80 (*)    All other components within normal limits  URINALYSIS, ROUTINE W REFLEX MICROSCOPIC - Abnormal; Notable for the following components:   APPearance HAZY (*)    Hgb urine dipstick SMALL (*)    Bacteria, UA RARE (*)    All other components within normal limits  Lactic Acid, Venous    Component Value Date/Time   LATICACIDVEN 1.1 11/25/2017 1713    Na 141 K 3.9  Cr  Stable  Lab Results  Component Value Date   CREATININE 0.93 04/16/2018   CREATININE 0.65 04/08/2018   CREATININE 0.70 03/09/2018      WBC  12.7  HG/HCT  Stable,     Component Value Date/Time   HGB 12.6 (L) 04/16/2018 1734   HCT 36.9 (L) 04/16/2018 1734    UA   not ordered    CXR - LL infiltrate    ECG:  Not ordered    ED Triage Vitals  Enc Vitals Group     BP 04/16/18 1556 102/60     Pulse Rate 04/16/18 1556 85     Resp 04/16/18 1556 16     Temp 04/16/18 1556 98.3 F (36.8 C)     Temp Source 04/16/18 1556 Oral     SpO2 04/16/18  1556 96 %     Weight --      Height --      Head Circumference --      Peak Flow --      Pain Score 04/16/18 1730 0     Pain Loc --      Pain Edu? --      Excl. in [place]? --   TMAX(24)@       Latest  Blood pressure (!) 140/58, pulse 70, temperature 98.3 F (36.8 C), temperature source Oral, resp. rate 17, SpO2 99 %.     Hospitalist was called for admission for CAP   Review of Systems:    Pertinent positives include: Fevers, chills, fatigue,  Constitutional:  No weight loss, night sweats,  weight loss  HEENT:  No headaches, Difficulty swallowing,Tooth/dental problems,Sore throat,  No sneezing, itching, ear ache, nasal congestion, post nasal drip,  Cardio-vascular:  No chest pain, Orthopnea, PND, anasarca, dizziness, palpitations.no Bilateral lower extremity swelling  GI:  No heartburn, indigestion, abdominal pain, nausea, vomiting, diarrhea, change in bowel habits, loss of appetite, melena, blood in stool, hematemesis Resp:  no shortness of breath at rest. No dyspnea on exertion, No excess mucus, no productive cough, No non-productive cough, No coughing up of blood.No change in color of mucus.No wheezing. Skin:  no rash or lesions. No jaundice GU:  no dysuria, change in color of urine, no urgency or frequency. No straining to urinate.  No flank pain.  Musculoskeletal:  No joint pain or no joint swelling. No decreased range of motion. No back pain.  Psych:  No change in mood or affect. No depression or anxiety. No memory loss.  Neuro: no localizing neurological complaints, no tingling, no weakness, no double vision, no gait abnormality, no slurred speech, no confusion  All systems reviewed and apart from Murphy all are negative  Past Medical History:   Past Medical History:  Diagnosis Date  . Acute bronchitis   . Arthritis   . Atrial fibrillation ([place])    Dr. Harl Bowie- [facility] follows saw 11'14  . Bladder stones    tx. with oral meds and antibiotics, now surgery  planned  . Cataracts, both eyes    surgery planned May 2015  . Diabetes mellitus without complication ([place])    Type II  . Dyspnea    with activity  . Dysrhythmia    Afib  . Family history of breast cancer   . Family history of colon cancer   . GERD (gastroesophageal reflux disease)   . 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 Margot Chimes, thus far without recurrence.   . Hypertension   . NHL (non-Hodgkin's lymphoma) ([place]) 2005   Diffuse large B-cell lymphoma, clinically stage IIIA, CD20 positive, status post cervical lymph node biopsy 08/03/2003 on the left. PET scan was also positive in the spleen and small bowel region, but bone marrow aspiration and biopsy were negative. So he essentially had stage IIIAs. He received R-CHOP x6 cycles with CR established by PET scan criteria on 11/23/2003 with no evidence for relapse th  . PAD (peripheral artery disease) ([place]) 08/27/2017  . Skin cancer    Basal cell- face,head, neck,  arms, legs, Back      Past Surgical History:  Procedure Laterality Date  . AMPUTATION Left 06/29/2017   Procedure: AMPUTATION LEFT GREAT TOE;  Surgeon: [name], MD;  Location: [facility];  Service: Vascular;  Laterality: Left;  . AMPUTATION Left 08/27/2017   Procedure: AMPUTATION Left SECOND TOE;  Surgeon: [name], MD;  Location: [facility];  Service: Vascular;  Laterality: Left;  . CATARACT EXTRACTION Bilateral 2015  . CHOLECYSTECTOMY    . COLON RESECTION     small bowel  . CYSTOSCOPY WITH LITHOLAPAXY N/A 08/10/2013   Procedure: CYSTOSCOPY WITH LITHOLAPAXY WITH [name];  Surgeon: [name], MD;  Location: [facility];  Service: Urology;  Laterality: N/A;  . ESOPHAGEAL DILATION N/A 02/27/2016   Procedure: ESOPHAGEAL  DILATION;  Surgeon: [name], MD;  [facility];  Service: Endoscopy;  Laterality: N/A;  . ESOPHAGOGASTRODUODENOSCOPY N/A 02/27/2016   Procedure: ESOPHAGOGASTRODUODENOSCOPY (EGD);  Surgeon: [name], MD;  [facility];  Service: Endoscopy;  Laterality: N/A;  12:00  . INCISION AND DRAINAGE PERIRECTAL ABSCESS    . LOWER EXTREMITY ANGIOGRAPHY N/A 06/25/2017   Procedure: LOWER EXTREMITY ANGIOGRAPHY;  Surgeon: [name], MD;  Location: [facility];  Service: Cardiovascular;  Laterality: N/A;  . LYMPH NODE DISSECTION Left    '05-neck  . PORT-A-CATH REMOVAL    . PORTACATH PLACEMENT     insertion and removal -last chemotherapy 10 yrs ago  . TRANSURETHRAL RESECTION OF PROSTATE N/A 08/10/2013   Procedure: TRANSURETHRAL RESECTION OF THE PROSTATE WITH GYRUS INSTRUMENTS;  Surgeon: [name], MD;  Location: [facility];  Service: Urology;  Laterality: N/A;    Social History:  Ambulatory  Gilford Rile      reports that he has never smoked. He has never used smokeless tobacco. He reports that he does not drink alcohol or use drugs.     Family History:   Family History  Problem Relation Age of Onset  . Heart attack Mother        died in her 36s  . Other Father 43       pneumonia - died  . Breast cancer Sister        dx in her 42s-60s  . Colon cancer Brother        dx in his 79s  . Cancer Sister        TAH/BSO due to "male cancer"  . Breast cancer Daughter 25  . Breast cancer Daughter 44       DCIS - ATM VUS on [employer] 83 gene panel in 2018  . Breast cancer Daughter 31  DCIS - Negative on [employer] MyRisk 25 gene apnel in 2015  . Thyroid cancer Daughter 27       Medullary and Papillary  . Thyroid nodules Grandchild     Allergies: Allergies  Allergen Reactions  . Augmentin [Amoxicillin-Pot Clavulanate] Rash    Redness of skin     Prior to Admission medications   Medication Sig Start Date End Date Taking? Authorizing Provider    Abiraterone Acetate (ZYTIGA PO) Take by mouth.    [provider]  abiraterone acetate (ZYTIGA) 250 MG tablet Take 2 tablets (500 mg total) by mouth daily. Take on an empty stomach 1 hour before or 2 hours after a meal 03/11/18   Lockamy, Randi L, NP-C  acetaminophen (TYLENOL) 500 MG tablet Take 500-1,000 mg by mouth daily as needed for mild pain, fever or headache.    [provider]  Calcium Carb-Cholecalciferol (CALCIUM 600+D) 600-800 MG-UNIT TABS Take 1 tablet by mouth 2 (two) times daily.    [provider]  Cholecalciferol (VITAMIN D3) 1000 units CAPS Take 1 capsule by mouth every morning.    [provider]  Cranberry-Vitamin C-Vitamin E (CRANBERRY PLUS VITAMIN C) 4200-20-3 MG-MG-UNIT CAPS Take 1 capsule by mouth every morning.    [provider]  Cyanocobalamin (VITAMIN B-12 IJ) Inject 100 mcg as directed every 30 (thirty) days.    [provider]  denosumab (XGEVA) 120 MG/1.7ML SOLN injection Inject 120 mg into the skin every 30 (thirty) days.     [provider]  diltiazem (CARDIZEM) 90 MG tablet TAKE 1 TABLET BY MOUTH TWICE DAILY -- **NEEDS APPOINTMENT FOR FURTHER REFILLS** Patient taking differently: Take 90 mg by mouth 2 (two) times daily.  12/03/17   Arnoldo Lenis, MD  diphenhydrAMINE-APAP, sleep, (TYLENOL PM EXTRA STRENGTH PO) Take by mouth at bedtime.    [provider]  donepezil (ARICEPT) 5 MG tablet TAKE 1 TABLET BY MOUTH ONCE DAILY FOR 1 MONTH THEN FILL THE 10 MG PRESCRIPTION 03/09/18   [provider]  donepezil (ARICEPT) 5 MG tablet Take 5 mg by mouth at bedtime.    [provider]  furosemide (LASIX) 20 MG tablet Take 1 tablet (20 mg total) by mouth daily. Patient taking differently: Take 20 mg by mouth every morning.  03/18/17 03/18/18  Kathie Dike, MD  furosemide (LASIX) 20 MG tablet Take 20 mg by mouth.    [provider]  glipiZIDE (GLUCOTROL XL) 5 MG 24 hr tablet  Take 5 mg by mouth daily with breakfast.  06/25/15   [provider]  Leuprolide Acetate, 6 Month, (LUPRON DEPOT) 45 MG injection Inject 45 mg into the muscle every 6 (six) months.    [provider]  lisinopril (PRINIVIL,ZESTRIL) 20 MG tablet Take 1 tablet (20 mg total) daily by mouth. Patient taking differently: Take 20 mg by mouth every morning.  02/25/17   Sater, [name], MD  loperamide (IMODIUM) 2 MG capsule Take 2 mg by mouth daily as needed for diarrhea or loose stools.     [provider]  metFORMIN (GLUCOPHAGE) 850 MG tablet Take 850 mg by mouth 2 (two) times daily with a meal.  03/23/17   [provider]  Multiple Vitamin (MULTIVITAMIN WITH MINERALS) TABS tablet Take 1 tablet by mouth every morning.    [provider]  NON FORMULARY Katragadda 2 caps daily (527m)    [provider]  nystatin (MYCOSTATIN) powder Apply 1 g topically 4 (four) times daily as needed (fungus).  [provider]  omeprazole (PRILOSEC) 40 MG capsule Take 20 mg by mouth every other day.  12/14/17   [provider]  potassium chloride (K-DUR,KLOR-CON) 10 MEQ tablet Take 2 tablets (20 mEq total) by mouth daily. Patient taking differently: Take 10 mEq by mouth 2 (two) times daily.  06/21/17   Higgs, Mathis Dad, MD  predniSONE (DELTASONE) 5 MG tablet TAKE 1 2 (ONE HALF) TABLET BY MOUTH TWICE DAILY WITH A MEAL 03/13/18   [provider]  sennosides-docusate sodium (SENOKOT-S) 8.6-50 MG tablet Take 1 tablet by mouth daily as needed for constipation.     [provider]  VENTOLIN HFA 108 (90 Base) MCG/ACT inhaler Inhale 1-2 puffs into the lungs every 6 (six) hours as needed for wheezing or shortness of breath.  03/08/17   [provider]  warfarin (COUMADIN) 2.5 MG tablet Take 2 - 3 tablets daily as directed by the anticoagulation clinic Patient taking differently: Take 2.5-5 mg by mouth See admin instructions. As directed by  Coumadin clinic 12/29/17   Arnoldo Lenis, MD   Physical Exam: Blood pressure (!) 140/58, pulse 70, temperature 98.3 F (36.8 C), temperature source Oral, resp. rate 17, SpO2 99 %. 1. General:  in No Acute distress   Chronically ill -appearing 2. Psychological: Alert but not  Oriented 3. Head/ENT:     Dry Mucous Membranes                          Head Non traumatic, neck supple                           Poor Dentition 4. SKIN:  decreased Skin turgor,  Skin clean Dry and intact no rash 5. Heart: Regular rate and rhythm no  Murmur, no Rub or gallop 6. Lungs:   no wheezes or crackles   7. Abdomen: Soft, non-tender, Non distended  bowel sounds present 8. Lower extremities: no clubbing, cyanosis, or  edema 9. Neurologically Grossly intact, moving all 4 extremities equally  10. MSK: Normal range of motion   LABS:     Recent Labs  Lab 04/16/18 1734  WBC 12.7*  NEUTROABS 9.4*  HGB 12.6*  HCT 36.9*  MCV 90.9  PLT 903   Basic Metabolic Panel: Recent Labs  Lab 04/16/18 1734  NA 141  K 3.9  CL 106  CO2 25  GLUCOSE 189*  BUN 15  CREATININE 0.93  CALCIUM 10.3      Recent Labs  Lab 04/16/18 1734  AST 17  ALT 16  ALKPHOS 47  BILITOT 1.3*  PROT 5.9*  ALBUMIN 3.4*   No results for input(s): LIPASE, AMYLASE in the last 168 hours. No results for input(s): AMMONIA in the last 168 hours.    HbA1C: No results for input(s): HGBA1C in the last 72 hours. CBG: No results for input(s): GLUCAP in the last 168 hours.    Urine analysis:    Component Value Date/Time   COLORURINE YELLOW 04/16/2018 1830   APPEARANCEUR HAZY (A) 04/16/2018 1830   LABSPEC 1.012 04/16/2018 1830   PHURINE 5.0 04/16/2018 1830   GLUCOSEU NEGATIVE 04/16/2018 1830   HGBUR SMALL (A) 04/16/2018 1830   BILIRUBINUR NEGATIVE 04/16/2018 1830   KETONESUR NEGATIVE 04/16/2018 1830   PROTEINUR NEGATIVE 04/16/2018 1830   NITRITE NEGATIVE 04/16/2018 1830   LEUKOCYTESUR NEGATIVE 04/16/2018 1830        Cultures:    Component Value  Date/Time   SDES  10/19/2017 1640    URINE, RANDOM Performed at [facility], [address], [place], [place] [address]    [facility]  10/19/2017 1640    NONE Performed at [facility], [address] [place], [place] [address]    CULT >=100,000 COLONIES/mL ESCHERICHIA COLI (A) 10/19/2017 1640   REPTSTATUS 10/21/2017 FINAL 10/19/2017 1640     Radiological Exams on Admission: Dg Chest 2 View  Result Date: 04/16/2018 CLINICAL DATA:  Confusion, temp 100 per family, complains of back pain EXAM: CHEST - 2 VIEW COMPARISON:  02/08/2018 FINDINGS: Heart margins are partially obscured. There is atherosclerotic calcification of the thoracic aorta. The in the LEFT LOWER lobe there is patchy infiltrate. Atelectasis or scarring identified at the RIGHT lung base. No pulmonary edema. IMPRESSION: LEFT LOWER lobe infiltrate. Electronically Signed   By: Nolon Nations M.D.   On: 04/16/2018 17:38    Chart has been reviewed    Assessment/Plan  84 y.o. male with medical history significant of metastatic prostate cancer, diabetes mellitus type 2, atrial fibrillation, GERD non-Hodgkin's lymphoma status post 6 cycles of R-CHOP no evidence of relapse, peripheral vascular disease, hypertension,   Admitted for CAP vs aspiration PNA  Present on Admission: . CAP (community acquired pneumonia) vs aspiration with hx of aspiration PNA in the past .  - will admit for treatment of CAP will start on appropriate antibiotic coverage.  Add Flagyl given possibility of aspiration   Obtain:  sputum cultures,                  Obtain respiratory panel and influenza serologies                 blood cultures if febrile or if decompensates.                   strep pneumo UA antigen,                    given diarrhea also check for Legionella.                Provide oxygen as needed.   . Essential hypertension -continue diltiazem lisinopril . ATRIAL FIBRILLATION -           - CHA2DS2 vas  score 5: continue current anticoagulation with  Coumadin per pharmacy,            -  Rate control:  Currently controlled with  Diltiazem,   will continue         . Encephalopathy acute -    - most likely multifactorial secondary to combination of infection   mild dehydration secondary to decreased by mouth intake,   - Will rehydrate   - treat underlining infection     - if no improvement may need further imaging to evaluate for CNS pathology pathology such as MRI of the brain   - neurological exam appears to be nonfocal but patient unable to cooperate fully     - no history of liver disease   . Bone metastases ([place]) chronic the setting of metastatic cancer followed by oncology . Prostate cancer [facility]) followed by oncology chronic continue home medications continue prednisone . Dementia without behavioral disturbance ([facility]) -chronically stable expect some degree of sundowning while hospitalized encephalopathy likely secondary to unmasking of dementia . Dehydration  -follow fluid status rehydrate   . NHL (non-Hodgkin's lymphoma) ([place]) in remission stable continue to follow-up with outpatient oncology . PAD (peripheral artery disease) (  [place]) -chronic and stable DM 2-  - Order Sensitive SSI     -  check TSH and HgA1C  - Hold by mouth medications    Other plan as per orders.  DVT prophylaxis:  On coumadin   Code Status:    DNR/DNI   as per  family  I had personally discussed CODE STATUS with patient and family   Family Communication:   Family at  Bedside  plan of care was discussed with   Daughters, Wife,   Disposition Plan:      To home once workup is complete and patient is stable                   Would benefit from PT/OT eval prior to DC  Ordered                                    Nutrition    consulted 3 of aspiration pneumonia in the past history of dysphagia on dysphagia 3 diet had speech pathology evaluation in the past                                     Consults called:  none  Admission status:   Obs    Level of care     medical floor              Toy Baker 04/16/2018, 8:53 PM    [facility]  Pager [phone]   after 2 AM please page floor coverage PA If 7AM-7PM, please contact the day team taking care of the patient  [email or web address]  Password [email or web address]

## 2018-04-16 NOTE — Progress Notes (Signed)
ANTICOAGULATION CONSULT NOTE - Initial Consult  Pharmacy Consult for Warfarin Indication: atrial fibrillation  Allergies  Allergen Reactions  . Tape Other (See Comments)    SKIN IS VERY THIN AND WILL TEAR AND BRUISE VERY EASILY!!!!  . Augmentin [Amoxicillin-Pot Clavulanate] Rash and Other (See Comments)    Redness of skin    Patient Measurements: Admit weight pending Weight = 69.6  on 04/08/18  Height = 70 inchges on 03/30/18   Vital Signs: Temp: 99.3 F (37.4 C) (12/28 2056) Temp Source: Oral (12/28 2056) BP: 145/72 (12/28 2100) Pulse Rate: 68 (12/28 2100)  Labs: Recent Labs    04/16/18 1734 04/16/18 2107  HGB 12.6*  --   HCT 36.9*  --   PLT 155  --   LABPROT  --  26.7*  INR  --  2.50  CREATININE 0.93  --     Estimated Creatinine Clearance: 49.9 mL/min (by C-G formula based on SCr of 0.93 mg/dL).   Medical History: Past Medical History:  Diagnosis Date  . Acute bronchitis   . Arthritis   . Atrial fibrillation ([place])    Dr. Harl Bowie- [facility] follows saw 11'14  . Bladder stones    tx. with oral meds and antibiotics, now surgery planned  . Cataracts, both eyes    surgery planned May 2015  . Diabetes mellitus without complication ([place])    Type II  . Dyspnea    with activity  . Dysrhythmia    Afib  . Family history of breast cancer   . Family history of colon cancer   . GERD (gastroesophageal reflux disease)   . 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 Margot Chimes, thus far without recurrence.   . Hypertension   . NHL (non-Hodgkin's lymphoma) ([place]) 2005   Diffuse large B-cell lymphoma, clinically stage IIIA, CD20 positive, status post cervical lymph node biopsy 08/03/2003 on the left. PET scan was also positive in the spleen  and small bowel region, but bone marrow aspiration and biopsy were negative. So he essentially had stage IIIAs. He received R-CHOP x6 cycles with CR established by PET scan criteria on 11/23/2003 with no evidence for relapse th  . PAD (peripheral artery disease) ([place]) 08/27/2017  . Skin cancer    Basal cell- face,head, neck,  arms, legs, Back    Medications:  Medications Prior to Admission  Medication Sig Dispense Refill Last Dose  . abiraterone acetate (ZYTIGA) 250 MG tablet Take 2 tablets (500 mg total) by mouth daily. Take on an empty stomach 1 hour before or 2 hours after a meal 60 tablet 6 04/16/2018 at 0800  . acetaminophen (TYLENOL) 500 MG tablet Take 500-1,000 mg by mouth every 8 (eight) hours as needed for mild pain, fever or headache.    04/16/2018 at Unknown time  . Calcium Carb-Cholecalciferol (CALCIUM 600+D) 600-800 MG-UNIT TABS Take 1 tablet by mouth 2 (two) times daily.   04/15/2018 at Unknown time  . Calcium Polycarbophil (FIBER-CAPS PO) Take 1 capsule by mouth 2 (two) times daily.   04/16/2018 at am  . Cholecalciferol (VITAMIN D3) 1000 units CAPS Take 1,000 Units by mouth every morning.    04/15/2018 at am  . Cranberry-Vitamin C-Vitamin E (CRANBERRY PLUS VITAMIN C) 4200-20-3 MG-MG-UNIT CAPS Take 1 capsule by mouth every morning.  04/15/2018 at am  . Cyanocobalamin (VITAMIN B-12 IJ) Inject 1,000 mcg as directed every 30 (thirty) days.    Past Month at Unknown time  . denosumab (XGEVA) 120 MG/1.7ML SOLN injection Inject 120 mg into the skin every 30 (thirty) days.    04/08/2018 at 1000  . diltiazem (CARDIZEM) 90 MG tablet TAKE 1 TABLET BY MOUTH TWICE DAILY -- **NEEDS APPOINTMENT FOR FURTHER REFILLS** (Patient taking differently: Take 90 mg by mouth 2 (two) times daily. ) 60 tablet 0 04/16/2018 at 1330  . diphenhydrAMINE-APAP, sleep, (TYLENOL PM EXTRA STRENGTH PO) Take 1 tablet by mouth at bedtime.    04/15/2018 at pm  . donepezil (ARICEPT) 10 MG tablet Take 10 mg by mouth daily.    04/16/2018 at am  . feeding supplement, ENSURE ENLIVE, (ENSURE ENLIVE) LIQD Take 237 mLs by mouth 2 (two) times daily between meals. CHOCOLATE   04/15/2018 at Unknown time  . furosemide (LASIX) 20 MG tablet Take 1 tablet (20 mg total) by mouth daily. (Patient taking differently: Take 20 mg by mouth every morning. ) 30 tablet 11 04/16/2018 at am  . glipiZIDE (GLUCOTROL XL) 5 MG 24 hr tablet Take 5 mg by mouth daily with breakfast.    04/16/2018 at Unknown time  . guaiFENesin (MUCINEX) 600 MG 12 hr tablet Take 600 mg by mouth 2 (two) times daily as needed for to loosen phlegm.   unk at [employer]  . Leuprolide Acetate, 6 Month, (LUPRON DEPOT) 45 MG injection Inject 45 mg into the muscle every 6 (six) months.   03/11/2018 at Unknown time  . lisinopril (PRINIVIL,ZESTRIL) 20 MG tablet Take 1 tablet (20 mg total) daily by mouth. (Patient taking differently: Take 20 mg by mouth every morning. ) 30 tablet 11 04/16/2018 at Unknown time  . loperamide (IMODIUM) 2 MG capsule Take 2 mg by mouth daily as needed for diarrhea or loose stools.    unk at [facility]  . metFORMIN (GLUCOPHAGE) 850 MG tablet Take 850 mg by mouth 2 (two) times daily with a meal.    04/16/2018 at am  . mirtazapine (REMERON) 7.5 MG tablet Take 7.5 mg by mouth at bedtime.   04/15/2018 at pm  . Multiple Vitamin (MULTIVITAMIN WITH MINERALS) TABS tablet Take 1 tablet by mouth every morning.   04/16/2018 at Unknown time  . mupirocin ointment (BACTROBAN) 2 % Place 1 application into the nose 2 (two) times daily as needed (for any toe infections).   unk at [employer]  . nystatin (MYCOSTATIN) powder Apply 1 g topically 4 (four) times daily as needed (fungus).    unk at [employer]  . omeprazole (PRILOSEC) 40 MG capsule Take 40 mg by mouth every other day.   3 04/16/2018 at 1330  . potassium chloride (K-DUR,KLOR-CON) 10 MEQ tablet Take 2 tablets (20 mEq total) by mouth daily. (Patient taking differently: Take 10 mEq by mouth 2 (two) times daily. ) 60 tablet 0 04/16/2018 at am  .  predniSONE (DELTASONE) 5 MG tablet Take 2.5 mg by mouth 2 (two) times daily.   1 04/16/2018 at am  . simethicone (MYLICON) 80 MG chewable tablet Chew 80 mg by mouth See admin instructions. Take 80 mg by mouth in the evening and as needed for gas   04/16/2018 at Unknown time  . VENTOLIN HFA 108 (90 Base) MCG/ACT inhaler Inhale 1-2 puffs into the lungs every 6 (six) hours as needed for wheezing or shortness of breath.    unk at [facility]  . warfarin (COUMADIN) 2.5  MG tablet Take 2 - 3 tablets daily as directed by the anticoagulation clinic (Patient taking differently: Take 5 mg by mouth at bedtime. ) 75 tablet 3 04/15/2018 at 2100    Assessment: 84 y.o male admitted 04/16/18 with chief complaint of cough, low grade fever and altered mental status per family.  Admitteded for CAP.  He is on Warfarin pta for h/o Afib.  Pharmacy consulted to dose Warfarin. INR = 2.5 is therapeutic on admission 04/16/18.  PTA dose : Warfarin 5 mg daily , LD taken 12/27 @21 :00 Hgb 12.6, pltc 155k  Azithromycin started , which can increase warfarin effect  Goal of Therapy:  INR 2-3 Monitor platelets by anticoagulation protocol: Yes   Plan:  Warfarin 5 mg po x1 tonight Daily Protime /INR, monitor for signs and symptoms of bleeding Watch for DDI with azithromycin, may increase effect of warfarin  Nicole Cella, RPh Clinical Pharmacist Please check AMION for all [place] phone numbers After 10:00 PM, call [place] [phone] 04/16/2018,9:45 PM

## 2018-04-17 DIAGNOSIS — J181 Lobar pneumonia, unspecified organism: Secondary | ICD-10-CM | POA: Diagnosis not present

## 2018-04-17 DIAGNOSIS — J189 Pneumonia, unspecified organism: Secondary | ICD-10-CM | POA: Diagnosis not present

## 2018-04-17 DIAGNOSIS — I4891 Unspecified atrial fibrillation: Secondary | ICD-10-CM | POA: Diagnosis not present

## 2018-04-17 DIAGNOSIS — G9341 Metabolic encephalopathy: Secondary | ICD-10-CM | POA: Diagnosis not present

## 2018-04-17 LAB — COMPREHENSIVE METABOLIC PANEL
ALT: 14 U/L (ref 0–44)
AST: 15 U/L (ref 15–41)
Albumin: 3 g/dL — ABNORMAL LOW (ref 3.5–5.0)
Alkaline Phosphatase: 40 U/L (ref 38–126)
Anion gap: 6 (ref 5–15)
BUN: 13 mg/dL (ref 8–23)
CO2: 28 mmol/L (ref 22–32)
Calcium: 9.1 mg/dL (ref 8.9–10.3)
Chloride: 109 mmol/L (ref 98–111)
Creatinine, Ser: 0.79 mg/dL (ref 0.61–1.24)
GFR calc Af Amer: >60 mL/min (ref >60)
GFR calc non Af Amer: >60 mL/min (ref >60)
Glucose, Bld: 79 mg/dL (ref 70–99)
Potassium: 3.4 mmol/L — ABNORMAL LOW (ref 3.5–5.1)
Sodium: 143 mmol/L (ref 135–145)
Total Bilirubin: 0.8 mg/dL (ref 0.3–1.2)
Total Protein: 5.4 g/dL — ABNORMAL LOW (ref 6.5–8.1)

## 2018-04-17 LAB — CBC
HCT: 31.5 % — ABNORMAL LOW (ref 39.0–52.0)
Hemoglobin: 11 g/dL — ABNORMAL LOW (ref 13.0–17.0)
MCH: 31.6 pg (ref 26.0–34.0)
MCHC: 34.9 g/dL (ref 30.0–36.0)
MCV: 90.5 fL (ref 80.0–100.0)
Platelets: 136 10*3/uL — ABNORMAL LOW (ref 150–400)
RBC: 3.48 MIL/uL — ABNORMAL LOW (ref 4.22–5.81)
RDW: 13.9 % (ref 11.5–15.5)
WBC: 9.4 10*3/uL (ref 4.0–10.5)
nRBC: 0 % (ref 0.0–0.2)

## 2018-04-17 LAB — INFLUENZA PANEL BY PCR (TYPE A & B)
Influenza A By PCR: NEGATIVE
Influenza B By PCR: NEGATIVE

## 2018-04-17 LAB — GLUCOSE, CAPILLARY
Glucose-Capillary: 119 mg/dL — ABNORMAL HIGH (ref 70–99)
Glucose-Capillary: 127 mg/dL — ABNORMAL HIGH (ref 70–99)
Glucose-Capillary: 213 mg/dL — ABNORMAL HIGH (ref 70–99)
Glucose-Capillary: 61 mg/dL — ABNORMAL LOW (ref 70–99)
Glucose-Capillary: 77 mg/dL (ref 70–99)
Glucose-Capillary: 89 mg/dL (ref 70–99)
Glucose-Capillary: 97 mg/dL (ref 70–99)

## 2018-04-17 LAB — PROTIME-INR
INR: 3
Prothrombin Time: 30.7 seconds — ABNORMAL HIGH (ref 11.4–15.2)

## 2018-04-17 LAB — HEMOGLOBIN A1C
Hgb A1c MFr Bld: 5.3 % (ref 4.8–5.6)
Mean Plasma Glucose: 105.41 mg/dL

## 2018-04-17 LAB — STREP PNEUMONIAE URINARY ANTIGEN: Strep Pneumo Urinary Antigen: NEGATIVE

## 2018-04-17 LAB — URINE CULTURE: Culture: <10000 — AB

## 2018-04-17 LAB — PHOSPHORUS: Phosphorus: 1.6 mg/dL — ABNORMAL LOW (ref 2.5–4.6)

## 2018-04-17 LAB — PREALBUMIN: Prealbumin: 16.6 mg/dL — ABNORMAL LOW (ref 18–38)

## 2018-04-17 LAB — TSH: TSH: 3.749 u[IU]/mL (ref 0.350–4.500)

## 2018-04-17 LAB — MAGNESIUM: Magnesium: 1.2 mg/dL — ABNORMAL LOW (ref 1.7–2.4)

## 2018-04-17 MED ORDER — ABIRATERONE ACETATE 250 MG PO TABS
500.0000 mg | ORAL_TABLET | Freq: Every day | ORAL | Status: DC
  Administered 2018-04-18: 500 mg via ORAL
  Filled 2018-04-17: qty 2

## 2018-04-17 MED ORDER — SODIUM CHLORIDE 0.9 % IV SOLN: INTRAVENOUS | Status: DC | PRN

## 2018-04-17 MED ORDER — SODIUM PHOSPHATES 45 MMOLE/15ML IV SOLN
30.0000 mmol | Freq: Once | INTRAVENOUS | Status: AC
  Administered 2018-04-17: 30 mmol via INTRAVENOUS
  Filled 2018-04-17: qty 10

## 2018-04-17 MED ORDER — WARFARIN SODIUM 3 MG PO TABS
3.0000 mg | ORAL_TABLET | Freq: Once | ORAL | Status: AC
  Administered 2018-04-17: 3 mg via ORAL
  Filled 2018-04-17: qty 1

## 2018-04-17 MED ORDER — MAGNESIUM SULFATE 50 % IJ SOLN
3.0000 g | Freq: Once | INTRAVENOUS | Status: AC
  Administered 2018-04-17: 3 g via INTRAVENOUS
  Filled 2018-04-17: qty 6

## 2018-04-17 MED ORDER — POTASSIUM CHLORIDE CRYS ER 20 MEQ PO TBCR
40.0000 meq | EXTENDED_RELEASE_TABLET | Freq: Once | ORAL | Status: AC
  Administered 2018-04-17: 40 meq via ORAL
  Filled 2018-04-17: qty 2

## 2018-04-17 NOTE — Progress Notes (Signed)
Hypoglycemic Event  CBG: 61  Treatment: Orange juice and jello  Symptoms: Asymptomatic  Follow-up CBG: Time:0500 CBG Result:77  Possible Reasons for Event: No food intake since arrival on the floor  Comments/MD notified: Yes    Corinna Lines

## 2018-04-17 NOTE — Progress Notes (Signed)
Patient received alert, oriented. Denies pain and shortness of breath. Oriented patient to room and use of call light. Will monitor patient

## 2018-04-17 NOTE — Progress Notes (Signed)
ANTICOAGULATION CONSULT NOTE - Follow Up Consult  Pharmacy Consult for Warfarin Indication: atrial fibrillation  Allergies  Allergen Reactions  . Tape Other (See Comments)    SKIN IS VERY THIN AND WILL TEAR AND BRUISE VERY EASILY!!!!  . Augmentin [Amoxicillin-Pot Clavulanate] Rash and Other (See Comments)    Redness of skin    Patient Measurements:     Vital Signs: Temp: 98.1 F (36.7 C) (12/29 0355) Temp Source: Oral (12/29 0355) BP: 124/57 (12/29 0355) Pulse Rate: 68 (12/29 0355)  Labs: Recent Labs    04/16/18 1734 04/16/18 2107 04/17/18 0407  HGB 12.6*  --  11.0*  HCT 36.9*  --  31.5*  PLT 155  --  136*  LABPROT  --  26.7* 30.7*  INR  --  2.50 3.00  CREATININE 0.93  --  0.79    Estimated Creatinine Clearance: 58 mL/min (by C-G formula based on SCr of 0.79 mg/dL).   Assessment: 84 year old admitted with pneumonia on Ceftriaxone, Azithromycin, and Flagyl for possible aspiration who was on Warfarin prior to admission for atrial fibrillation. Pharmacy was consulted to resume Warfarin dosing inpatient.   INR today 3 - therapeutic, rising. Major drug interaction noted with Flagyl. Also, drug interaction with Azithromycin.  Will empirically lower dose of Warfarin tonight and watch INR.  Hgb down some at 11, Plts down to 136 - will monitor closely.  No bleeding reported.   Goal of Therapy:  INR 2-3 Monitor platelets by anticoagulation protocol: Yes   Plan:  Lower dose of Warfarin 3mg  tonight due to drug interactions noted and rising INR Daily PT/INR Consider stopping Flagyl now with major drug interaction with Warfarin Stop Azithromycin as soon as possible due to drug interaction with Warfarin  Brain Hilts 04/17/2018,8:16 AM

## 2018-04-17 NOTE — Evaluation (Signed)
Physical Therapy Evaluation Patient Details Name: CHRISS MANNAN MRN: [id number] DOB: 1923-09-07 Today's Date: 04/17/2018   History of Present Illness  JOVIN FESTER is a 84 y.o. male with medical history significant of metastatic prostate cancer, diabetes mellitus type 2, atrial fibrillation, GERD non-Hodgkin's lymphoma status post 6 cycles of R-CHOP no evidence of relapse, peripheral vascular disease, hypertension, chronic diarrhea, presented to hospital with cough, fever, weakness, found to have LLL PNA  Clinical Impression   Pt admitted with above diagnosis. Pt currently with functional limitations due to the deficits listed below (see PT Problem List). Uses RW and some assist for ADLs at baseline; Presents with generalized weakness, decr activity tolerance; History of falls;  Pt will benefit from skilled PT to increase their independence and safety with mobility to allow discharge to the venue listed below.       Follow Up Recommendations Home health PT;Supervision/Assistance - 24 hour;Other (comment)(HHAide as well)    Equipment Recommendations  None recommended by PT    Recommendations for Other Services       Precautions / Restrictions Precautions Precautions: Fall Precaution Comments: History of falls Restrictions Weight Bearing Restrictions: No      Mobility  Bed Mobility Overal bed mobility: Modified Independent             General bed mobility comments: incr time  Transfers Overall transfer level: Needs assistance Equipment used: Rolling walker (2 wheeled) Transfers: Sit to/from Stand Sit to Stand: Min guard         General transfer comment: Cues for hand placement  Ambulation/Gait Ambulation/Gait assistance: Min guard;Min assist Gait Distance (Feet): 200 Feet(approxq) Assistive device: Rolling walker (2 wheeled) Gait Pattern/deviations: Step-through pattern;Trunk flexed Gait velocity: slw   General Gait Details: Cues for RW proximity, and  upright posture, as well as to self-monitor for activity tolerance  Stairs            Wheelchair Mobility    Modified Rankin (Stroke Patients Only)       Balance Overall balance assessment: History of Falls                                           Pertinent Vitals/Pain Pain Assessment: Faces Faces Pain Scale: Hurts a little bit Pain Location: R knee with progressive amb Pain Descriptors / Indicators: Aching Pain Intervention(s): Monitored during session    Home Living Family/patient expects to be discharged to:: Private residence Living Arrangements: Spouse/significant other Available Help at Discharge: Family;Available 24 hours/day Type of Home: House Home Access: Stairs to enter;Ramped entrance Entrance Stairs-Rails: Right Entrance Stairs-Number of Steps: 3 in front and 4 steps in back, 1 siderail on right side going up in front Home Layout: Able to live on main level with bedroom/bathroom;One level;Laundry or work area in [place]: Environmental consultant - 2 wheels;Bedside commode;Shower seat;Cane - single point;Wheelchair - manual Additional Comments: Daughter tells me he recently had a ramp put in to enter home    Prior Function Level of Independence: Needs assistance   Gait / Transfers Assistance Needed: Household and short distanced community ambulator with RW  ADL's / Homemaking Assistance Needed: assisted by spouse; uses tub transfer bench  Comments: Pt assisted with ADLs by wife at baseline     Hand Dominance   Dominant Hand: Right    Extremity/Trunk Assessment   Upper Extremity Assessment Upper Extremity Assessment: Generalized weakness  Lower Extremity Assessment Lower Extremity Assessment: Generalized weakness       Communication   Communication: HOH  Cognition Arousal/Alertness: Awake/alert Behavior During Therapy: WFL for tasks assessed/performed Overall Cognitive Status: Within Functional Limits for tasks  assessed                                        General Comments General comments (skin integrity, edema, etc.): Walked on Room Air and O2 sats remained greater tahn or equal to 92%; fatigued at end of walk    Exercises     Assessment/Plan    PT Assessment Patient needs continued PT services  PT Problem List Decreased strength;Decreased activity tolerance;Decreased balance;Decreased mobility;Decreased coordination;Decreased knowledge of use of DME;Decreased safety awareness;Decreased knowledge of precautions;Cardiopulmonary status limiting activity       PT Treatment Interventions DME instruction;Gait training;Stair training;Functional mobility training;Therapeutic activities;Therapeutic exercise;Balance training;Patient/family education    PT Goals (Current goals can be found in the Care Plan section)  Acute Rehab PT Goals Patient Stated Goal: Hopes to get better and go home PT Goal Formulation: With patient Time For Goal Achievement: 05/01/18 Potential to Achieve Goals: Good    Frequency Min 3X/week   Barriers to discharge        Co-evaluation               AM-PAC PT "6 Clicks" Mobility  Outcome Measure Help needed turning from your back to your side while in a flat bed without using bedrails?: None Help needed moving from lying on your back to sitting on the side of a flat bed without using bedrails?: None Help needed moving to and from a bed to a chair (including a wheelchair)?: A Little Help needed standing up from a chair using your arms (e.g., wheelchair or bedside chair)?: A Little Help needed to walk in hospital room?: A Little Help needed climbing 3-5 steps with a railing? : A Little 6 Click Score: 20    End of Session Equipment Utilized During Treatment: Gait belt Activity Tolerance: Patient tolerated treatment well Patient left: in chair;with call bell/phone within reach;with chair alarm set Nurse Communication: Mobility status PT  Visit Diagnosis: Unsteadiness on feet (R26.81);Other abnormalities of gait and mobility (R26.89);Muscle weakness (generalized) (M62.81);History of falling (Z91.81)    Time: 2671-2458 PT Time Calculation (min) (ACUTE ONLY): 35 min   Charges:   PT Evaluation $PT Eval Low Complexity: 1 Low PT Treatments $Gait Training: 8-22 mins        Roney Marion, PT  Acute Rehabilitation Services Pager [phone] Office [phone]   Colletta [place] 04/17/2018, 9:20 AM

## 2018-04-17 NOTE — Progress Notes (Signed)
PROGRESS NOTE    [name]  FWY:[id number] DOB: [birth year] DOA: 04/16/2018 PCP: Rosita Fire, MD    Brief Narrative:  [name] is a 84 y.o. male with medical history significant of metastatic prostate cancer, diabetes mellitus type 2, atrial fibrillation, GERD non-Hodgkin's lymphoma status post 6 cycles of R-CHOP no evidence of relapse, peripheral vascular disease, hypertension, chronic diarrhea, admitted for fever, and cough.  He was admitted for left lower lobe pneumonia.    Assessment & Plan:   Active Problems:   Essential hypertension   ATRIAL FIBRILLATION   Long term (current) use of anticoagulants   NHL (non-Hodgkin's lymphoma) ([facility])   Prostate cancer ([place])   Bone metastases ([facility])   Polyneuropathy   Type 2 diabetes mellitus ([place])   PAD (peripheral artery disease) ([facility])   Encephalopathy acute   CAP (community acquired pneumonia)   Dementia without behavioral disturbance ([place])   Dehydration   Acute metabolic encephalopathy   Community-acquired pneumonia/aspiration pneumonia Patient admitted for IV antibiotics. Patient is currently on IV Rocephin, Zithromax and Flagyl. Incentive spirometry. Patient is currently on room air with good oxygen saturations. Blood cultures ordered and are negative so far.  Afebrile and WBC count within normal limits.    Mild anemia of chronic disease Hemoglobin around 11.  Dementia Patient appears to be at baseline mental status.  No agitation.   Acute metabolic encephalopathy Appears to have improved patient currently is at baseline.    Type 2 diabetes mellitus CBG (last 3)  Recent Labs    04/17/18 0754 04/17/18 1222 04/17/18 1602  GLUCAP 89 127* 119*   BG's are well controlled. Continue with sliding scale insulin.    Chronic atrial fibrillation Rate control On Coumadin for anticoagulation and INR is therapeutic.   Hypokalemia, hypomagnesemia, hypophosphatemia Replaced and repeat the levels  tomorrow.   Prostate cancer with bone mets/non-Hodgkin's lymphoma Further management as per oncologist as outpatient  DVT prophylaxis: COUMADIN.  Code Status: DNR Family Communication: family at bedside.  Disposition Plan: pending clinical improvement.  Consultants:   none  Procedures: None   Antimicrobials:  Rocephin and Azithromycin  Subjective: Breathing better, on RA,  Continues to cough on deep breathing and tachypnea.   Objective: Vitals:   04/16/18 2056 04/16/18 2100 04/16/18 2144 04/17/18 0355  BP:  (!) 145/72 140/69 (!) 124/57  Pulse:  68 79 68  Resp:  18 18 18   Temp: 99.3 F (37.4 C)  98.2 F (36.8 C) 98.1 F (36.7 C)  TempSrc: Oral  Oral Oral  SpO2:  99% 98% 94%    Intake/Output Summary (Last 24 hours) at 04/17/2018 1229 Last data filed at 04/17/2018 0930 Gross per 24 hour  Intake 2650 ml  Output 150 ml  Net 2500 ml   There were no vitals filed for this visit.  Examination:  General exam: Appears calm and comfortable not in distress.  Respiratory system: Coarse breath sounds, no wheezing or rhonchi.  Cardiovascular system: S1 & S2 heard, RRR. No JVD, Gastrointestinal system: Abdomen is nondistended, soft and nontender. Normal bowel sounds heard. Central nervous system: Alert and oriented to place and person only.  Extremities: no pedal edema or cyanosis.  Skin: No rashes, lesions or ulcers Psychiatry:  Mood & affect appropriate.     Data Reviewed: I have personally reviewed following labs and imaging studies  CBC: Recent Labs  Lab 04/16/18 1734 04/17/18 0407  WBC 12.7* 9.4  NEUTROABS 9.4*  --   HGB 12.6* 11.0*  HCT 36.9* 31.5*  MCV 90.9 90.5  PLT 155 671*   Basic Metabolic Panel: Recent Labs  Lab 04/16/18 1734 04/17/18 0407  NA 141 143  K 3.9 3.4*  CL 106 109  CO2 25 28  GLUCOSE 189* 79  BUN 15 13  CREATININE 0.93 0.79  CALCIUM 10.3 9.1  MG  --  1.2*  PHOS  --  1.6*   GFR: Estimated Creatinine Clearance: 58 mL/min (by  C-G formula based on SCr of 0.79 mg/dL). Liver Function Tests: Recent Labs  Lab 04/16/18 1734 04/17/18 0407  AST 17 15  ALT 16 14  ALKPHOS 47 40  BILITOT 1.3* 0.8  PROT 5.9* 5.4*  ALBUMIN 3.4* 3.0*   No results for input(s): LIPASE, AMYLASE in the last 168 hours. No results for input(s): AMMONIA in the last 168 hours. Coagulation Profile: Recent Labs  Lab 04/16/18 2107 04/17/18 0407  INR 2.50 3.00   Cardiac Enzymes: No results for input(s): CKTOTAL, CKMB, CKMBINDEX, TROPONINI in the last 168 hours. BNP (last 3 results) No results for input(s): PROBNP in the last 8760 hours. HbA1C: Recent Labs    04/17/18 0407  HGBA1C 5.3   CBG: Recent Labs  Lab 04/17/18 0026 04/17/18 0407 04/17/18 0504 04/17/18 0754 04/17/18 1222  GLUCAP 97 61* 77 89 127*   Lipid Profile: No results for input(s): CHOL, HDL, LDLCALC, TRIG, CHOLHDL, LDLDIRECT in the last 72 hours. Thyroid Function Tests: Recent Labs    04/17/18 0407  TSH 3.749   Anemia Panel: No results for input(s): VITAMINB12, FOLATE, FERRITIN, TIBC, IRON, RETICCTPCT in the last 72 hours. Sepsis Labs: No results for input(s): PROCALCITON, LATICACIDVEN in the last 168 hours.  Recent Results (from the past 240 hour(s))  Blood culture (routine x 2)     Status: None (Preliminary result)   Collection Time: 04/16/18  5:35 PM  Result Value Ref Range Status   Specimen Description BLOOD SITE NOT SPECIFIED  Final   Special Requests   Final    BOTTLES DRAWN AEROBIC AND ANAEROBIC Blood Culture adequate volume   Culture   Final    NO GROWTH < 24 HOURS Performed at [facility], [address] [place], [place] [address]    Report Status PENDING  Incomplete  Blood culture (routine x 2)     Status: None (Preliminary result)   Collection Time: 04/16/18  8:00 PM  Result Value Ref Range Status   Specimen Description BLOOD RIGHT HAND  Final   Special Requests   Final    BOTTLES DRAWN AEROBIC AND ANAEROBIC Blood Culture  results may not be optimal due to an inadequate volume of blood received in culture bottles   Culture   Final    NO GROWTH < 24 HOURS Performed at [facility], [place] [address], [place], [place] [address]    Report Status PENDING  Incomplete         Radiology Studies: Dg Chest 2 View  Result Date: 04/16/2018 CLINICAL DATA:  Confusion, temp 100 per family, complains of back pain EXAM: CHEST - 2 VIEW COMPARISON:  02/08/2018 FINDINGS: Heart margins are partially obscured. There is atherosclerotic calcification of the thoracic aorta. The in the LEFT LOWER lobe there is patchy infiltrate. Atelectasis or scarring identified at the RIGHT lung base. No pulmonary edema. IMPRESSION: LEFT LOWER lobe infiltrate. Electronically Signed   By: Nolon Nations M.D.   On: 04/16/2018 17:38        Scheduled Meds: . abiraterone acetate  500 mg Oral Daily  .  diltiazem  90 mg Oral BID  . donepezil  10 mg Oral Daily  . guaiFENesin  600 mg Oral BID  . insulin aspart  0-9 Units Subcutaneous Q4H  . lisinopril  20 mg Oral q morning - 10a  . mirtazapine  7.5 mg Oral QHS  . pantoprazole  40 mg Oral Daily  . potassium chloride  40 mEq Oral Once  . predniSONE  2.5 mg Oral BID  . Warfarin - Pharmacist Dosing Inpatient   Does not apply q1800   Continuous Infusions: . sodium chloride 100 mL/hr at 04/17/18 1152  . azithromycin 500 mg (04/16/18 2324)  . cefTRIAXone (ROCEPHIN)  IV    . metronidazole 500 mg (04/17/18 0648)     LOS: 0 days    Time spent: 32 minutes    Hosie Poisson, MD [facility] Pager [phone]  If 7PM-7AM, please contact night-coverage [email or web address] Password [facility] 04/17/2018, 12:29 PM

## 2018-04-18 ENCOUNTER — Telehealth: Payer: Self-pay | Admitting: *Deleted

## 2018-04-18 DIAGNOSIS — E1169 Type 2 diabetes mellitus with other specified complication: Secondary | ICD-10-CM | POA: Diagnosis not present

## 2018-04-18 DIAGNOSIS — J181 Lobar pneumonia, unspecified organism: Secondary | ICD-10-CM | POA: Diagnosis not present

## 2018-04-18 DIAGNOSIS — I4891 Unspecified atrial fibrillation: Secondary | ICD-10-CM | POA: Diagnosis not present

## 2018-04-18 DIAGNOSIS — G9341 Metabolic encephalopathy: Secondary | ICD-10-CM | POA: Diagnosis not present

## 2018-04-18 DIAGNOSIS — F0390 Unspecified dementia without behavioral disturbance: Secondary | ICD-10-CM | POA: Diagnosis not present

## 2018-04-18 DIAGNOSIS — Z7901 Long term (current) use of anticoagulants: Secondary | ICD-10-CM | POA: Diagnosis not present

## 2018-04-18 DIAGNOSIS — C61 Malignant neoplasm of prostate: Secondary | ICD-10-CM | POA: Diagnosis not present

## 2018-04-18 DIAGNOSIS — J189 Pneumonia, unspecified organism: Secondary | ICD-10-CM | POA: Diagnosis not present

## 2018-04-18 DIAGNOSIS — C7951 Secondary malignant neoplasm of bone: Secondary | ICD-10-CM | POA: Diagnosis not present

## 2018-04-18 DIAGNOSIS — E860 Dehydration: Secondary | ICD-10-CM | POA: Diagnosis not present

## 2018-04-18 DIAGNOSIS — I10 Essential (primary) hypertension: Secondary | ICD-10-CM | POA: Diagnosis not present

## 2018-04-18 LAB — GASTROINTESTINAL PANEL BY PCR, STOOL (REPLACES STOOL CULTURE)

## 2018-04-18 LAB — GLUCOSE, CAPILLARY
Glucose-Capillary: 111 mg/dL — ABNORMAL HIGH (ref 70–99)
Glucose-Capillary: 141 mg/dL — ABNORMAL HIGH (ref 70–99)
Glucose-Capillary: 156 mg/dL — ABNORMAL HIGH (ref 70–99)
Glucose-Capillary: 228 mg/dL — ABNORMAL HIGH (ref 70–99)

## 2018-04-18 LAB — LEGIONELLA PNEUMOPHILA SEROGP 1 UR AG: L. pneumophila Serogp 1 Ur Ag: NEGATIVE

## 2018-04-18 LAB — BASIC METABOLIC PANEL
Anion gap: 9 (ref 5–15)
BUN: 12 mg/dL (ref 8–23)
CO2: 22 mmol/L (ref 22–32)
Calcium: 8.2 mg/dL — ABNORMAL LOW (ref 8.9–10.3)
Chloride: 109 mmol/L (ref 98–111)
Creatinine, Ser: 0.69 mg/dL (ref 0.61–1.24)
GFR calc Af Amer: >60 mL/min (ref >60)
GFR calc non Af Amer: >60 mL/min (ref >60)
Glucose, Bld: 151 mg/dL — ABNORMAL HIGH (ref 70–99)
Potassium: 3.2 mmol/L — ABNORMAL LOW (ref 3.5–5.1)
Sodium: 140 mmol/L (ref 135–145)

## 2018-04-18 LAB — LACTOFERRIN, FECAL, QUALITATIVE: Lactoferrin, Fecal, Qual: POSITIVE — AB

## 2018-04-18 LAB — PROTIME-INR
INR: 3.74
Prothrombin Time: 36.4 seconds — ABNORMAL HIGH (ref 11.4–15.2)

## 2018-04-18 LAB — MAGNESIUM: Magnesium: 1.7 mg/dL (ref 1.7–2.4)

## 2018-04-18 LAB — PHOSPHORUS: Phosphorus: 3.4 mg/dL (ref 2.5–4.6)

## 2018-04-18 MED ORDER — CEFDINIR 300 MG PO CAPS: 300.0000 mg | ORAL_CAPSULE | Freq: Two times a day (BID) | ORAL | 0 refills | Status: DC

## 2018-04-18 MED ORDER — AZITHROMYCIN 250 MG PO TABS: 250.0000 mg | ORAL_TABLET | Freq: Every day | ORAL | 0 refills | Status: DC

## 2018-04-18 MED ORDER — AZITHROMYCIN 250 MG PO TABS
250.0000 mg | ORAL_TABLET | Freq: Every day | ORAL | Status: DC
  Administered 2018-04-18: 250 mg via ORAL
  Filled 2018-04-18: qty 1

## 2018-04-18 MED ORDER — CEFDINIR 300 MG PO CAPS
300.0000 mg | ORAL_CAPSULE | Freq: Two times a day (BID) | ORAL | Status: DC
  Administered 2018-04-18: 300 mg via ORAL
  Filled 2018-04-18: qty 1

## 2018-04-18 MED ORDER — LOPERAMIDE HCL 2 MG PO CAPS: 2.0000 mg | ORAL_CAPSULE | ORAL | 0 refills | Status: DC | PRN

## 2018-04-18 MED ORDER — LOPERAMIDE HCL 2 MG PO CAPS: 2.0000 mg | ORAL_CAPSULE | ORAL | Status: DC | PRN

## 2018-04-18 MED ORDER — ENSURE ENLIVE PO LIQD: 237.0000 mL | Freq: Two times a day (BID) | ORAL | Status: DC

## 2018-04-18 NOTE — Care Management Note (Addendum)
Case Management Note  Patient Details  Name: [name] MRN: [id number] Date of Birth: 23-Jul-1923  Subjective/Objective:                    Action/Plan Update [name] returned call, they want [facility] , orders entered Pollie Meyer with [facility] accepted referral. Spoke to daughter [name] via phone, explained Medicare observation letter . Ms Shon Hale voiced understanding regarding observation status , and patient would need to make inpatient stay criteria three midnights for medicare to cover SNF.   Patient from home with his wife. Patient has three daughters that take turns staying with their parents, per [name] "parents are never alone".  Patient has had home health multiple times. Provided home health list from [email or web address]. Left home health in patient's room with another daughter. [name] has my direct cell phone number and will call when they have decided on a home health agency.   Will need MD orders and face to face.    Expected Discharge Date:                  Expected Discharge Plan:  [place]  In-House Referral:     Discharge planning Services  CM Consult  Post Acute Care Choice:  Home Health Choice offered to:  Patient, Adult Children  DME Arranged:  N/A DME Agency:  NA  HH Arranged:  PT, Nurse's Aide, OT SW HH Agency:   [facility]  Status of Service:  Completed   If discussed at [facility] of Stay Meetings, dates discussed:    Additional Comments:  Marilu Favre, RN 04/18/2018, 10:03 AM

## 2018-04-18 NOTE — Evaluation (Addendum)
Occupational Therapy Evaluation Patient Details Name: [name] MRN: [id number] DOB: [birth year] Today's Date: 04/18/2018    History of Present Illness [name] is a 84 y.o. male with medical history significant of metastatic prostate cancer, diabetes mellitus type 2, atrial fibrillation, GERD non-Hodgkin's lymphoma status post 6 cycles of R-CHOP no evidence of relapse, peripheral vascular disease, hypertension, chronic diarrhea, presented to hospital with cough, fever, weakness, found to have LLL PNA   Clinical Impression   Pt admitted with LLL PNA . PTA: pt requiring assist from family in home; spouse and daughters rotate 24/7 care for elderly parents. Pt requires assist with ADLs, mostly lower body and mobility w/ RW. Pt currently with functional limitiations due to the deficits in strength, mobility and self care. Pt current level requires minguard for transfers and ADl functional mobility for stability and verbal cues for proper hand placement. Pt with O2 levels >93% on RA and HR 90-107 BPM per pulse ox ambulating 175' with RW. Pt with dyspnea noted. No c/o pain noted. Pt performing ADLs with ModA for lower body and minA for upper body. Pt will benefit from skilled OT to increase their independence and safety with adls and balance to allow discharge with HHOT.     Follow Up Recommendations  Home health OT;Supervision/Assistance - 24 hour    Equipment Recommendations       Recommendations for Other Services       Precautions / Restrictions Precautions Precautions: Fall Precaution Comments: History of falls      Mobility Bed Mobility Overal bed mobility: Modified Independent             General bed mobility comments: incr time  Transfers Overall transfer level: Needs assistance Equipment used: Rolling walker (2 wheeled) Transfers: Sit to/from Stand Sit to Stand: Min guard         General transfer comment: Cues for hand placement    Balance Overall  balance assessment: History of Falls                                         ADL either performed or assessed with clinical judgement   ADL Overall ADL's : Needs assistance/impaired Eating/Feeding: Set up   Grooming: Wash/dry hands;Wash/dry face;Oral care;Min guard   Upper Body Bathing: Minimal assistance   Lower Body Bathing: Moderate assistance;Sit to/from stand   Upper Body Dressing : Minimal assistance   Lower Body Dressing: Moderate assistance;Sit to/from stand   Toilet Transfer: Minimal assistance;Grab bars   Toileting- Clothing Manipulation and Hygiene: Maximal assistance       Functional mobility during ADLs: Rolling walker;Min guard General ADL Comments: pt bending down to fix shoe donning/doffing     Vision Baseline Vision/History: No visual deficits Patient Visual Report: No change from baseline Vision Assessment?: No apparent visual deficits     Perception     Praxis      Pertinent Vitals/Pain Faces Pain Scale: Hurts a little bit Pain Location: R knee with progressive amb Pain Descriptors / Indicators: Aching     Hand Dominance Right   Extremity/Trunk Assessment Upper Extremity Assessment Upper Extremity Assessment: Overall WFL for tasks assessed   Lower Extremity Assessment Lower Extremity Assessment: Overall WFL for tasks assessed   Cervical / Trunk Assessment Cervical / Trunk Assessment: Kyphotic   Communication Communication Communication: HOH   Cognition Arousal/Alertness: Awake/alert Behavior During Therapy: WFL for tasks assessed/performed Overall  Cognitive Status: Within Functional Limits for tasks assessed                                     General Comments  pt's O2 levels remained >92% on RA throughout 175' ambulation    Exercises Exercises: Shoulder Shoulder Exercises Shoulder Flexion: AROM;5 reps;Seated Elbow Flexion: AROM;5 reps;Seated Elbow Extension: AROM;5 reps;Seated Wrist Flexion:  AROM;5 reps;Seated Wrist Extension: AROM;5 reps;Seated   Shoulder Instructions      Home Living Family/patient expects to be discharged to:: Private residence Living Arrangements: Spouse/significant other Available Help at Discharge: Family;Available 24 hours/day Type of Home: House Home Access: Stairs to enter;Ramped entrance Entrance Stairs-Number of Steps: 3 in front and 4 steps in back, 1 siderail on right side going up in front Entrance Stairs-Rails: Right Home Layout: Able to live on main level with bedroom/bathroom;One level;Laundry or work area in basement     [employer] Shower/Tub: Teacher, early years/pre: Handicapped height Bathroom Accessibility: Yes   Home Equipment: Environmental consultant - 2 wheels;Bedside commode;Cane - single point;Wheelchair - manual;Tub bench   Additional Comments: Daughter tells me he recently had a ramp put in to enter home      Prior Functioning/Environment Level of Independence: Needs assistance  Gait / Transfers Assistance Needed: Household and short distanced community ambulator with RW ADL's / Homemaking Assistance Needed: assisted by spouse; uses tub transfer bench   Comments: Pt assisted with ADLs by wife at baseline        OT Problem List: Decreased strength;Decreased activity tolerance;Impaired balance (sitting and/or standing);Decreased safety awareness;Pain      OT Treatment/Interventions: Self-care/ADL training;Therapeutic exercise;Energy conservation;Therapeutic activities;Balance training;Patient/family education    OT Goals(Current goals can be found in the care plan section) Acute Rehab OT Goals Patient Stated Goal: Hopes to get better and go home OT Goal Formulation: With patient Time For Goal Achievement: 04/18/18 Potential to Achieve Goals: Fair  OT Frequency: Min 2X/week   Barriers to D/C:            Co-evaluation              AM-PAC OT "6 Clicks" Daily Activity     Outcome Measure Help from another person  eating meals?: A Little Help from another person taking care of personal grooming?: A Little Help from another person toileting, which includes using toliet, bedpan, or urinal?: A Little Help from another person bathing (including washing, rinsing, drying)?: A Lot Help from another person to put on and taking off regular upper body clothing?: A Little Help from another person to put on and taking off regular lower body clothing?: A Lot 6 Click Score: 16   End of Session Equipment Utilized During Treatment: Gait belt Nurse Communication: Mobility status  Activity Tolerance: Patient tolerated treatment well Patient left: in chair;with call bell/phone within reach;with chair alarm set;with family/visitor present  OT Visit Diagnosis: Unsteadiness on feet (R26.81);Repeated falls (R29.6);Muscle weakness (generalized) (M62.81)                Time: 4665-9935 OT Time Calculation (min): 47 min Charges:  OT General Charges $OT Visit: 1 Visit OT Evaluation $OT Eval Moderate Complexity: 1 Mod OT Treatments $Self Care/Home Management : 8-22 mins $Therapeutic Activity: 8-22 mins  Ebony Hail Harold Hedge) Marsa Aris OTR/L Acute Rehabilitation Services Pager: [phone] Office: [phone]   Fredda Hammed 04/18/2018, 10:20 AM

## 2018-04-18 NOTE — Telephone Encounter (Signed)
Daughter called.  Pt was d/c from hospital today.  INR was 3.74 this morning.  Pt d/c on omnicef x 6 days and zithro x 4 days.  Told to have pt hold coumadin tonight, take 5mg  Tuesday, 2.5mg  on Wednesday and and Home Health to check on Thursday.  She verbalized understanding.

## 2018-04-18 NOTE — Progress Notes (Signed)
ANTICOAGULATION CONSULT NOTE - Follow Up Consult  Pharmacy Consult for Warfarin Indication: atrial fibrillation  Allergies  Allergen Reactions  . Tape Other (See Comments)    SKIN IS VERY THIN AND WILL TEAR AND BRUISE VERY EASILY!!!!  . Augmentin [Amoxicillin-Pot Clavulanate] Rash and Other (See Comments)    Redness of skin    Vital Signs: Temp: 97.7 F (36.5 C) (12/30 0639) Temp Source: Oral (12/30 0639) BP: 167/93 (12/30 0639) Pulse Rate: 80 (12/30 0639)  Labs: Recent Labs    04/16/18 1734 04/16/18 2107 04/17/18 0407 04/18/18 0113  HGB 12.6*  --  11.0*  --   HCT 36.9*  --  31.5*  --   PLT 155  --  136*  --   LABPROT  --  26.7* 30.7* 36.4*  INR  --  2.50 3.00 3.74  CREATININE 0.93  --  0.79 0.69    Estimated Creatinine Clearance: 58 mL/min (by C-G formula based on SCr of 0.69 mg/dL).   Assessment: 84 year old admitted with pneumonia on Coumadin 5mg  daily PTA for Afib. Admit INR (12/28) = 2.5. Hgb 11, plts 136. Has received Flagyl and azithro which interact with Coumadin. INR now up to 3.74 even after empirically reducing dose.  Goal of Therapy:  INR 2-3 Monitor platelets by anticoagulation protocol: Yes   Plan:  Hold Coumadin tonight Monitor daily INR, CBC, s/s of bleed  Cleto Claggett J 04/18/2018,10:17 AM

## 2018-04-18 NOTE — Care Management Obs Status (Signed)
MEDICARE OBSERVATION STATUS NOTIFICATION   Patient Details  Name: [name] MRN: [id number] Date of Birth: 06-Feb-1924   Medicare Observation Status Notification Given:yes     Spoke to daughter [name] via phone [phone], Ms Shon Hale voiced understanding.  Marilu Favre, RN 04/18/2018, 10:02 AM

## 2018-04-18 NOTE — Discharge Summary (Signed)
Discharge Summary  [name] ERD:[id number] DOB: 04/29/1923  PCP: Rosita Fire, MD  Admit date: 04/16/2018 Discharge date: 05/04/2018   Time spent: < 25 minutes  Admitted From: Home Disposition: Home with home health PT  Recommendations for Outpatient Follow-up:  1. Follow up with PCP in 1 week 2. Complete full course of oral azithromycin and cefdinir 3.     Discharge Diagnoses:  Active Hospital Problems   Diagnosis Date Noted  . CAP (community acquired pneumonia) 04/16/2018  . Dementia without behavioral disturbance ([place]) 04/16/2018  . Dehydration 04/16/2018  . Acute metabolic encephalopathy 56/31/4970  . Encephalopathy acute 01/31/2018  . PAD (peripheral artery disease) ([place]) 08/27/2017  . Type 2 diabetes mellitus ([place]) 03/16/2017  . Polyneuropathy 02/19/2017  . Bone metastases ([place]) 08/21/2015  . Prostate cancer ([place]) 09/08/2013  . NHL (non-Hodgkin's lymphoma) ([place]) 08/18/2011  . Long term (current) use of anticoagulants 07/18/2010  . Essential hypertension 10/04/2008  . ATRIAL FIBRILLATION 10/04/2008    Resolved Hospital Problems  No resolved problems to display.    Discharge Condition: Stable  CODE STATUS: DNR  History of present illness:  [name] is a 84 y.o. year old male with medical history significant for metastatic prostate cancer, diabetes mellitus type 2, atrial fibrillation, GERD non-Hodgkin's lymphoma status post 6 cycles of R-CHOP no evidence of relapse, peripheral vascular disease, hypertension, chronic diarrhea who presented on 04/16/2018 with low-grade fever, cough, diminished p.o. intake with an episode of diarrhea and altered mental status and was found to have left lower lobe pneumonia. Remaining hospital course addressed in problem based format below:   Hospital Course:   Community-acquired pneumonia Empirically was placed on IV Rocephin and azithromycin for CAP with Flagyl added due to concern for possible aspiration pneumonia  given his altered mental status on presentation.Blood cultures remain unremarkable patient had no sepsis physiology throughout hospitalization.  Patient was transitioned to oral cefdinir and azithromycin to complete on discharge, remained on room air with good oxygen saturation.  HTN, Remained at goal on home lisinopril  A. fib, remained rate control during hospitalization.  Will continue Coumadin on discharge.  Acute Metabolicencephalopathy, resolved.  Patient returned to baseline mental status with treatment of infection above.  Prostate cancer with bone metastasis, followed by oncology, will continue his prior to additional medications including prednisone and discharge  Dementia without behavioral disturbances, monitored for sundowning during hospital stay.  Continue donepezil 0  Type 2 diabetes can resume home oral hypoglycemics on discharge    Discharge Exam: BP (!) 177/77 (BP Location: Left Arm)   Pulse 76   Temp 98.1 F (36.7 C) (Oral)   Resp 18   SpO2 98%   General: Lying in bed, no apparent distress Eyes: EOMI, anicteric ENT: Oral Mucosa clear and moist Cardiovascular: irregularly irregular Rhythm, no murmurs, rubs or gallops, no edema, Respiratory: Normal respiratory effort on room air, lungs clear to auscultation bilaterally Abdomen: soft, non-distended, non-tender, normal bowel sounds Skin: No Rash Neurologic: Grossly no focal neuro deficit.Mental status alert to person, and place only psychiatric:Appropriate affect, and mood   Discharge Instructions You were cared for by a hospitalist during your hospital stay. If you have any questions about your discharge medications or the care you received while you were in the hospital after you are discharged, you can call the unit and asked to speak with the hospitalist on call if the hospitalist that took care of you is not available. Once you are discharged, your primary care physician will handle any  further medical issues.  Please note that NO REFILLS for any discharge medications will be authorized once you are discharged, as it is imperative that you return to your primary care physician (or establish a relationship with a primary care physician if you do not have one) for your aftercare needs so that they can reassess your need for medications and monitor your lab values.  Discharge Instructions    Diet - low sodium heart healthy   Complete by:  As directed    Increase activity slowly   Complete by:  As directed      Allergies as of 04/18/2018      Reactions   Tape Other (See Comments)   SKIN IS VERY THIN AND WILL TEAR AND BRUISE VERY EASILY!!!!   Augmentin [amoxicillin-pot Clavulanate] Rash, Other (See Comments)   Redness of skin      Medication List    TAKE these medications   acetaminophen 500 MG tablet Commonly known as:  TYLENOL Take 500-1,000 mg by mouth every 8 (eight) hours as needed for mild pain, fever or headache.   azithromycin 250 MG tablet Commonly known as:  ZITHROMAX Take 1 tablet (250 mg total) by mouth daily.   CALCIUM 600+D 600-800 MG-UNIT Tabs Generic drug:  Calcium Carb-Cholecalciferol Take 1 tablet by mouth 2 (two) times daily.   cefdinir 300 MG capsule Commonly known as:  OMNICEF Take 1 capsule (300 mg total) by mouth every 12 (twelve) hours.   CRANBERRY PLUS VITAMIN C 4200-20-3 MG-MG-UNIT Caps Generic drug:  Cranberry-Vitamin C-Vitamin E Take 1 capsule by mouth every morning.   denosumab 120 MG/1.7ML Soln injection Commonly known as:  XGEVA Inject 120 mg into the skin every 30 (thirty) days.   diltiazem 90 MG tablet Commonly known as:  CARDIZEM TAKE 1 TABLET BY MOUTH TWICE DAILY -- **NEEDS APPOINTMENT FOR FURTHER REFILLS** What changed:    how much to take  how to take this  when to take this  additional instructions   donepezil 10 MG tablet Commonly known as:  ARICEPT Take 10 mg by mouth daily.   feeding supplement (ENSURE ENLIVE) Liqd Take 237  mLs by mouth 2 (two) times daily between meals. CHOCOLATE   FIBER-CAPS PO Take 1 capsule by mouth 2 (two) times daily.   furosemide 20 MG tablet Commonly known as:  LASIX Take 1 tablet (20 mg total) by mouth daily. What changed:  when to take this   glipiZIDE 5 MG 24 hr tablet Commonly known as:  GLUCOTROL XL Take 5 mg by mouth daily with breakfast.   guaiFENesin 600 MG 12 hr tablet Commonly known as:  MUCINEX Take 600 mg by mouth 2 (two) times daily as needed for to loosen phlegm.   lisinopril 20 MG tablet Commonly known as:  PRINIVIL,ZESTRIL Take 1 tablet (20 mg total) daily by mouth. What changed:  when to take this   loperamide 2 MG capsule Commonly known as:  IMODIUM Take 1 capsule (2 mg total) by mouth as needed for diarrhea or loose stools. What changed:  when to take this   LUPRON DEPOT (30-MONTH) 45 MG injection Generic drug:  Leuprolide Acetate (6 Month) Inject 45 mg into the muscle every 6 (six) months.   metFORMIN 850 MG tablet Commonly known as:  GLUCOPHAGE Take 850 mg by mouth 2 (two) times daily with a meal.   mirtazapine 7.5 MG tablet Commonly known as:  REMERON Take 7.5 mg by mouth at bedtime.   multivitamin with minerals Tabs tablet  Take 1 tablet by mouth every morning.   mupirocin ointment 2 % Commonly known as:  BACTROBAN Place 1 application into the nose 2 (two) times daily as needed (for any toe infections).   nystatin powder Commonly known as:  MYCOSTATIN/NYSTOP Apply 1 g topically 4 (four) times daily as needed (fungus).   omeprazole 40 MG capsule Commonly known as:  PRILOSEC Take 40 mg by mouth every other day.   potassium chloride 10 MEQ tablet Commonly known as:  K-DUR,KLOR-CON Take 2 tablets (20 mEq total) by mouth daily. What changed:    how much to take  when to take this   predniSONE 5 MG tablet Commonly known as:  DELTASONE Take 2.5 mg by mouth 2 (two) times daily.   simethicone 80 MG chewable tablet Commonly known  as:  MYLICON Chew 80 mg by mouth See admin instructions. Take 80 mg by mouth in the evening and as needed for gas   TYLENOL PM EXTRA STRENGTH PO Take 1 tablet by mouth at bedtime.   VENTOLIN HFA 108 (90 Base) MCG/ACT inhaler Generic drug:  albuterol Inhale 1-2 puffs into the lungs every 6 (six) hours as needed for wheezing or shortness of breath.   VITAMIN B-12 IJ Inject 1,000 mcg as directed every 30 (thirty) days.   Vitamin D3 25 MCG (1000 UT) Caps Take 1,000 Units by mouth every morning.   warfarin 2.5 MG tablet Commonly known as:  COUMADIN Take as directed. If you are unsure how to take this medication, talk to your nurse or doctor. Original instructions:  Take 2 - 3 tablets daily as directed by the anticoagulation clinic What changed:    how much to take  how to take this  when to take this  additional instructions      Allergies  Allergen Reactions  . Tape Other (See Comments)    SKIN IS VERY THIN AND WILL TEAR AND BRUISE VERY EASILY!!!!  . Augmentin [Amoxicillin-Pot Clavulanate] Rash and Other (See Comments)    Redness of skin   Follow-up Information    [facility] Follow up.   Specialty:  Barber Why:  provide home health  Contact information: [place] [id number] [phone]            The results of significant diagnostics from this hospitalization (including imaging, microbiology, ancillary and laboratory) are listed below for reference.    Significant Diagnostic Studies: Dg Chest 2 View  Result Date: 04/16/2018 CLINICAL DATA:  Confusion, temp 100 per family, complains of back pain EXAM: CHEST - 2 VIEW COMPARISON:  02/08/2018 FINDINGS: Heart margins are partially obscured. There is atherosclerotic calcification of the thoracic aorta. The in the LEFT LOWER lobe there is patchy infiltrate. Atelectasis or scarring identified at the RIGHT lung base. No pulmonary edema. IMPRESSION: LEFT LOWER lobe  infiltrate. Electronically Signed   By: Nolon Nations M.D.   On: 04/16/2018 17:38    Microbiology: No results found for this or any previous visit (from the past 240 hour(s)).   Labs: Basic Metabolic Panel: No results for input(s): NA, K, CL, CO2, GLUCOSE, BUN, CREATININE, CALCIUM, MG, PHOS in the last 168 hours. Liver Function Tests: No results for input(s): AST, ALT, ALKPHOS, BILITOT, PROT, ALBUMIN in the last 168 hours. No results for input(s): LIPASE, AMYLASE in the last 168 hours. No results for input(s): AMMONIA in the last 168 hours. CBC: No results for input(s): WBC, NEUTROABS, HGB, HCT, MCV, PLT in the last 168  hours. Cardiac Enzymes: No results for input(s): CKTOTAL, CKMB, CKMBINDEX, TROPONINI in the last 168 hours. BNP: BNP (last 3 results) No results for input(s): BNP in the last 8760 hours.  ProBNP (last 3 results) No results for input(s): PROBNP in the last 8760 hours.  CBG: No results for input(s): GLUCAP in the last 168 hours.     Signed:  Desiree Hane, MD [facility] 05/04/2018, 1:24 AM

## 2018-04-18 NOTE — Consult Note (Signed)
[place] Nurse wound consult note Reason for Consult: pressure injury Patient reported to be up in chair most of the day, however does not sleep in the chair.  Wears incontinent briefs (pull ups) per patient's request to wear them. Daughter at bedside.  I have notified her of the hospital policy related to the use if incontinence garments, she is aware to bring in from home if continued use desired.  Wound type:Stage 1 pressure injury upper medial buttocks with possible component of MASD (moisture associated skin damage) Pressure Injury POA: Yes Measurement: 4cm x 6cm area medial upper buttocks that does not blanch Wound bed: intact, non blanchable skin  Drainage (amount, consistency, odor) none Periwound: intact  Dressing procedure/placement/frequency: Moisture barrier cream TID, explained rationale to the patient's daughter for use. Provided daughter with tube for use at home. Pressure redistribution chair pad for use when up in the chair and to be taken home for use, daughter aware.   Discussed POC with patient and bedside nurse.  Re consult if needed, will not follow at this time. Thanks  Alis Sawchuk [employer], RN,CWOCN, CNS, [place] [phone])

## 2018-04-18 NOTE — Progress Notes (Signed)
Initial Nutrition Assessment  DOCUMENTATION CODES:   Not applicable  INTERVENTION:   - Add Ensure Enlive BID (each provides 350 kcal and 20 g protein)  NUTRITION DIAGNOSIS:   Inadequate oral intake related to chronic illness as evidenced by estimated needs.  GOAL:   Patient will meet greater than or equal to 90% of their needs  MONITOR:   PO intake, Supplement acceptance, Diet advancement, Weight trends, Labs  REASON FOR ASSESSMENT:   Consult Malnutrition Eval  ASSESSMENT:   84 yo male, admitted with pneumonia. PMH significant for metastatic prostate cancer, T2DM, atrial fibrillation, GERD, PVD, HTN, chronic diarrhea, GI stromal tumor, colon resection. Lives at home with family and ambulates with walker. Home med list includes calcium, vitamin D-3, vitamin C + E, vitamin B-12 injection monthy, lasix, imodium, metformin, MVI with minerals, Prilosec, potassium chloride, senokot-s, coumadin.  Labs: potassium 3.2, phosphorus 1.6, magnesium 1.2, glucose 151, PTT 36.4, Hgb 11.0, Hct 31.5% Meds: novolog q 4 hrs, remeron 7.5 mg daily, Protonix EC 40 mg daily, warfarin daily  Pt awake with daughters present at time of visit.  Pt reports fair appetite. Does not feel much like eating when having diarrhea. Typically eats a good breakfast at about 10:30 am, a good lunch mid-afternoon, and a light dinner. Drinks Equate-brand protein shakes at home. Does not follow any special diet and takes vitamin/minerals supplements.   Per family, pt has experienced lifelong, chronic diarrhea. GI doctor recommended having a Fiber One bar daily, and using imodium and senokot as needed. Daughter reports bowels have been much better with the Fiber One bar. Recommended to leave out the stool softener if pt is experiencing diarrhea.  Per chart and family report, wt stable over last 3 months.   No nausea or vomiting. Some difficulty chewing - pt is missing many teeth, but is used to it. Wife cuts up meat to  make it easier to chew. Suggested including extra sauces and gravies to keep it moist.  Encouraged pt to include protein-rich foods with all meals and snacks, and to eat those foods first if not feeling hungry. Given pt preference for larger meals earlier in the day, suggested family try the majority of protein needs in these meals. Pt and family amenable to Ensure Enlive BID in chocolate - brought pt one this afternoon. Suggested to daughter to aim for ~2000 calories and 90-100 g protein daily.  NUTRITION - FOCUSED PHYSICAL EXAM: Deferred at this time.  Diet Order:  75-90% of breakfast and lunch on 12/29, per nsg documentation Diet Order            Diet - low sodium heart healthy        DIET DYS 3 Room service appropriate? Yes; Fluid consistency: Thin  Diet effective now             EDUCATION NEEDS:  Education needs have been addressed  Skin:  Skin Assessment: Skin Integrity Issues: Skin Integrity Issues:: Stage I Stage I: buttocks  Last BM:  12/29, type 7  Height:  Ht Readings from Last 1 Encounters:  03/30/18 5\' 10"  (1.778 m)    Weight:  Wt Readings from Last 1 Encounters:  04/08/18 69.6 kg    Ideal Body Weight:  75.5 kg  BMI:  There is no height or weight on file to calculate BMI. BMI of 22 based on old height and current weight.  Estimated Nutritional Needs:   Kcal:  1903 calories daily (MSJ x 1.4)  Protein:  91-113 gm daily (  1.2-1.5 g/kg IBW)  Fluid:  >/= 1.9 L daily or per MD discretion  Althea Grimmer, MS, RDN, LDN Pager: [phone]

## 2018-04-19 ENCOUNTER — Other Ambulatory Visit (HOSPITAL_COMMUNITY): Payer: Self-pay | Admitting: *Deleted

## 2018-04-19 DIAGNOSIS — E1151 Type 2 diabetes mellitus with diabetic peripheral angiopathy without gangrene: Secondary | ICD-10-CM | POA: Diagnosis not present

## 2018-04-19 DIAGNOSIS — C61 Malignant neoplasm of prostate: Secondary | ICD-10-CM

## 2018-04-19 DIAGNOSIS — I4891 Unspecified atrial fibrillation: Secondary | ICD-10-CM | POA: Diagnosis not present

## 2018-04-19 DIAGNOSIS — C7951 Secondary malignant neoplasm of bone: Secondary | ICD-10-CM | POA: Diagnosis not present

## 2018-04-19 DIAGNOSIS — F0390 Unspecified dementia without behavioral disturbance: Secondary | ICD-10-CM | POA: Diagnosis not present

## 2018-04-19 DIAGNOSIS — J189 Pneumonia, unspecified organism: Secondary | ICD-10-CM | POA: Diagnosis not present

## 2018-04-19 MED ORDER — ABIRATERONE ACETATE 250 MG PO TABS: 500.0000 mg | ORAL_TABLET | Freq: Every day | ORAL | 0 refills | Status: DC

## 2018-04-19 NOTE — Telephone Encounter (Signed)
Chart reviewed, zytiga refilled.

## 2018-04-21 ENCOUNTER — Telehealth (HOSPITAL_COMMUNITY): Payer: Self-pay | Admitting: Hematology

## 2018-04-21 ENCOUNTER — Ambulatory Visit (INDEPENDENT_AMBULATORY_CARE_PROVIDER_SITE_OTHER): Payer: Medicare Other | Admitting: Internal Medicine

## 2018-04-21 DIAGNOSIS — C7951 Secondary malignant neoplasm of bone: Secondary | ICD-10-CM | POA: Diagnosis not present

## 2018-04-21 DIAGNOSIS — F0390 Unspecified dementia without behavioral disturbance: Secondary | ICD-10-CM | POA: Diagnosis not present

## 2018-04-21 DIAGNOSIS — E1151 Type 2 diabetes mellitus with diabetic peripheral angiopathy without gangrene: Secondary | ICD-10-CM | POA: Diagnosis not present

## 2018-04-21 DIAGNOSIS — C61 Malignant neoplasm of prostate: Secondary | ICD-10-CM | POA: Diagnosis not present

## 2018-04-21 DIAGNOSIS — J189 Pneumonia, unspecified organism: Secondary | ICD-10-CM | POA: Diagnosis not present

## 2018-04-21 DIAGNOSIS — Z5181 Encounter for therapeutic drug level monitoring: Secondary | ICD-10-CM | POA: Diagnosis not present

## 2018-04-21 DIAGNOSIS — I4891 Unspecified atrial fibrillation: Secondary | ICD-10-CM

## 2018-04-21 LAB — CULTURE, BLOOD (ROUTINE X 2)
Culture: NO GROWTH
Culture: NO GROWTH
Special Requests: ADEQUATE

## 2018-04-21 LAB — POCT INR: INR: 1.7 — AB (ref 2.0–3.0)

## 2018-04-21 NOTE — Patient Instructions (Signed)
Description   Spoke with Kathlee Nations, [facility] RN and advised pt to take 7.5mg  today, then resume taking 5mg  daily.  Recheck INR in 1 week. Continue Ensure  (Not drinking consistently now)

## 2018-04-21 NOTE — Telephone Encounter (Signed)
FAXED [place]

## 2018-04-22 DIAGNOSIS — G9341 Metabolic encephalopathy: Secondary | ICD-10-CM | POA: Diagnosis not present

## 2018-04-22 DIAGNOSIS — R296 Repeated falls: Secondary | ICD-10-CM | POA: Diagnosis not present

## 2018-04-22 DIAGNOSIS — J189 Pneumonia, unspecified organism: Secondary | ICD-10-CM | POA: Diagnosis not present

## 2018-04-22 DIAGNOSIS — R260 Ataxic gait: Secondary | ICD-10-CM | POA: Diagnosis not present

## 2018-04-22 DIAGNOSIS — I4891 Unspecified atrial fibrillation: Secondary | ICD-10-CM | POA: Diagnosis not present

## 2018-04-22 DIAGNOSIS — C61 Malignant neoplasm of prostate: Secondary | ICD-10-CM | POA: Diagnosis not present

## 2018-04-22 DIAGNOSIS — F0390 Unspecified dementia without behavioral disturbance: Secondary | ICD-10-CM | POA: Diagnosis not present

## 2018-04-22 DIAGNOSIS — E1151 Type 2 diabetes mellitus with diabetic peripheral angiopathy without gangrene: Secondary | ICD-10-CM | POA: Diagnosis not present

## 2018-04-22 DIAGNOSIS — C7951 Secondary malignant neoplasm of bone: Secondary | ICD-10-CM | POA: Diagnosis not present

## 2018-04-22 DIAGNOSIS — I482 Chronic atrial fibrillation, unspecified: Secondary | ICD-10-CM | POA: Diagnosis not present

## 2018-04-25 ENCOUNTER — Telehealth (HOSPITAL_COMMUNITY): Payer: Self-pay | Admitting: Hematology

## 2018-04-25 DIAGNOSIS — J189 Pneumonia, unspecified organism: Secondary | ICD-10-CM | POA: Diagnosis not present

## 2018-04-25 DIAGNOSIS — F0390 Unspecified dementia without behavioral disturbance: Secondary | ICD-10-CM | POA: Diagnosis not present

## 2018-04-25 DIAGNOSIS — E1151 Type 2 diabetes mellitus with diabetic peripheral angiopathy without gangrene: Secondary | ICD-10-CM | POA: Diagnosis not present

## 2018-04-25 DIAGNOSIS — C7951 Secondary malignant neoplasm of bone: Secondary | ICD-10-CM | POA: Diagnosis not present

## 2018-04-25 DIAGNOSIS — I4891 Unspecified atrial fibrillation: Secondary | ICD-10-CM | POA: Diagnosis not present

## 2018-04-25 DIAGNOSIS — C61 Malignant neoplasm of prostate: Secondary | ICD-10-CM | POA: Diagnosis not present

## 2018-04-25 NOTE — Telephone Encounter (Signed)
PC TO [facility] TO GIVE VERBAL PRESCRIPTION INFO FOR ZYTIGA. GAVE NEW DOSAGE QTY AND REFILLS TO PHARMACIST.

## 2018-04-26 ENCOUNTER — Ambulatory Visit (INDEPENDENT_AMBULATORY_CARE_PROVIDER_SITE_OTHER): Payer: Medicare Other | Admitting: Pharmacist

## 2018-04-26 ENCOUNTER — Telehealth: Payer: Self-pay | Admitting: *Deleted

## 2018-04-26 DIAGNOSIS — J189 Pneumonia, unspecified organism: Secondary | ICD-10-CM | POA: Diagnosis not present

## 2018-04-26 DIAGNOSIS — C7951 Secondary malignant neoplasm of bone: Secondary | ICD-10-CM | POA: Diagnosis not present

## 2018-04-26 DIAGNOSIS — F0390 Unspecified dementia without behavioral disturbance: Secondary | ICD-10-CM | POA: Diagnosis not present

## 2018-04-26 DIAGNOSIS — Z5181 Encounter for therapeutic drug level monitoring: Secondary | ICD-10-CM

## 2018-04-26 DIAGNOSIS — I4891 Unspecified atrial fibrillation: Secondary | ICD-10-CM | POA: Diagnosis not present

## 2018-04-26 DIAGNOSIS — E1151 Type 2 diabetes mellitus with diabetic peripheral angiopathy without gangrene: Secondary | ICD-10-CM | POA: Diagnosis not present

## 2018-04-26 DIAGNOSIS — C61 Malignant neoplasm of prostate: Secondary | ICD-10-CM | POA: Diagnosis not present

## 2018-04-26 LAB — POCT INR: INR: 1.6 — AB (ref 2.0–3.0)

## 2018-04-26 NOTE — Telephone Encounter (Signed)
INR 1.6   Please call Denman George w/ [facility] @ [phone]

## 2018-04-26 NOTE — Telephone Encounter (Signed)
See anticoag note from 1/7

## 2018-04-27 DIAGNOSIS — J189 Pneumonia, unspecified organism: Secondary | ICD-10-CM | POA: Diagnosis not present

## 2018-04-27 DIAGNOSIS — C7951 Secondary malignant neoplasm of bone: Secondary | ICD-10-CM | POA: Diagnosis not present

## 2018-04-27 DIAGNOSIS — I4891 Unspecified atrial fibrillation: Secondary | ICD-10-CM | POA: Diagnosis not present

## 2018-04-27 DIAGNOSIS — E1151 Type 2 diabetes mellitus with diabetic peripheral angiopathy without gangrene: Secondary | ICD-10-CM | POA: Diagnosis not present

## 2018-04-27 DIAGNOSIS — F0390 Unspecified dementia without behavioral disturbance: Secondary | ICD-10-CM | POA: Diagnosis not present

## 2018-04-27 DIAGNOSIS — C61 Malignant neoplasm of prostate: Secondary | ICD-10-CM | POA: Diagnosis not present

## 2018-05-02 DIAGNOSIS — L570 Actinic keratosis: Secondary | ICD-10-CM | POA: Diagnosis not present

## 2018-05-02 DIAGNOSIS — L820 Inflamed seborrheic keratosis: Secondary | ICD-10-CM | POA: Diagnosis not present

## 2018-05-02 DIAGNOSIS — D225 Melanocytic nevi of trunk: Secondary | ICD-10-CM | POA: Diagnosis not present

## 2018-05-02 DIAGNOSIS — Z85828 Personal history of other malignant neoplasm of skin: Secondary | ICD-10-CM | POA: Diagnosis not present

## 2018-05-02 DIAGNOSIS — Z08 Encounter for follow-up examination after completed treatment for malignant neoplasm: Secondary | ICD-10-CM | POA: Diagnosis not present

## 2018-05-02 DIAGNOSIS — C44311 Basal cell carcinoma of skin of nose: Secondary | ICD-10-CM | POA: Diagnosis not present

## 2018-05-02 DIAGNOSIS — X32XXXD Exposure to sunlight, subsequent encounter: Secondary | ICD-10-CM | POA: Diagnosis not present

## 2018-05-03 ENCOUNTER — Telehealth: Payer: Self-pay | Admitting: *Deleted

## 2018-05-03 ENCOUNTER — Ambulatory Visit (INDEPENDENT_AMBULATORY_CARE_PROVIDER_SITE_OTHER): Payer: Medicare Other | Admitting: Cardiology

## 2018-05-03 DIAGNOSIS — Z5181 Encounter for therapeutic drug level monitoring: Secondary | ICD-10-CM | POA: Diagnosis not present

## 2018-05-03 DIAGNOSIS — E1151 Type 2 diabetes mellitus with diabetic peripheral angiopathy without gangrene: Secondary | ICD-10-CM | POA: Diagnosis not present

## 2018-05-03 DIAGNOSIS — I4891 Unspecified atrial fibrillation: Secondary | ICD-10-CM | POA: Diagnosis not present

## 2018-05-03 DIAGNOSIS — C61 Malignant neoplasm of prostate: Secondary | ICD-10-CM | POA: Diagnosis not present

## 2018-05-03 DIAGNOSIS — F0390 Unspecified dementia without behavioral disturbance: Secondary | ICD-10-CM | POA: Diagnosis not present

## 2018-05-03 DIAGNOSIS — C7951 Secondary malignant neoplasm of bone: Secondary | ICD-10-CM | POA: Diagnosis not present

## 2018-05-03 DIAGNOSIS — J189 Pneumonia, unspecified organism: Secondary | ICD-10-CM | POA: Diagnosis not present

## 2018-05-03 LAB — POCT INR: INR: 1.9 — AB (ref 2.0–3.0)

## 2018-05-03 NOTE — Telephone Encounter (Signed)
INR 1.9 PER YOLANDA W/ [facility] [phone]

## 2018-05-03 NOTE — Telephone Encounter (Signed)
Spoke with Denman George see Anticoagulation encounter.

## 2018-05-04 ENCOUNTER — Other Ambulatory Visit (HOSPITAL_COMMUNITY): Payer: Self-pay | Admitting: Hematology

## 2018-05-04 DIAGNOSIS — I4891 Unspecified atrial fibrillation: Secondary | ICD-10-CM | POA: Diagnosis not present

## 2018-05-04 DIAGNOSIS — C61 Malignant neoplasm of prostate: Secondary | ICD-10-CM | POA: Diagnosis not present

## 2018-05-04 DIAGNOSIS — J189 Pneumonia, unspecified organism: Secondary | ICD-10-CM | POA: Diagnosis not present

## 2018-05-04 DIAGNOSIS — E1151 Type 2 diabetes mellitus with diabetic peripheral angiopathy without gangrene: Secondary | ICD-10-CM | POA: Diagnosis not present

## 2018-05-04 DIAGNOSIS — F0390 Unspecified dementia without behavioral disturbance: Secondary | ICD-10-CM | POA: Diagnosis not present

## 2018-05-04 DIAGNOSIS — C7951 Secondary malignant neoplasm of bone: Secondary | ICD-10-CM | POA: Diagnosis not present

## 2018-05-05 DIAGNOSIS — E1151 Type 2 diabetes mellitus with diabetic peripheral angiopathy without gangrene: Secondary | ICD-10-CM | POA: Diagnosis not present

## 2018-05-05 DIAGNOSIS — F0390 Unspecified dementia without behavioral disturbance: Secondary | ICD-10-CM | POA: Diagnosis not present

## 2018-05-05 DIAGNOSIS — C61 Malignant neoplasm of prostate: Secondary | ICD-10-CM | POA: Diagnosis not present

## 2018-05-05 DIAGNOSIS — C7951 Secondary malignant neoplasm of bone: Secondary | ICD-10-CM | POA: Diagnosis not present

## 2018-05-05 DIAGNOSIS — J189 Pneumonia, unspecified organism: Secondary | ICD-10-CM | POA: Diagnosis not present

## 2018-05-05 DIAGNOSIS — I4891 Unspecified atrial fibrillation: Secondary | ICD-10-CM | POA: Diagnosis not present

## 2018-05-06 ENCOUNTER — Other Ambulatory Visit: Payer: Self-pay

## 2018-05-06 ENCOUNTER — Encounter (HOSPITAL_COMMUNITY): Payer: Self-pay

## 2018-05-06 ENCOUNTER — Inpatient Hospital Stay (HOSPITAL_COMMUNITY): Payer: Medicare Other | Attending: Hematology

## 2018-05-06 ENCOUNTER — Inpatient Hospital Stay (HOSPITAL_COMMUNITY): Payer: Medicare Other

## 2018-05-06 ENCOUNTER — Telehealth (HOSPITAL_COMMUNITY): Payer: Self-pay | Admitting: Hematology

## 2018-05-06 VITALS — BP 156/87 | HR 56 | Temp 97.6°F | Resp 16

## 2018-05-06 DIAGNOSIS — C61 Malignant neoplasm of prostate: Secondary | ICD-10-CM | POA: Diagnosis not present

## 2018-05-06 DIAGNOSIS — C7951 Secondary malignant neoplasm of bone: Secondary | ICD-10-CM | POA: Insufficient documentation

## 2018-05-06 LAB — COMPREHENSIVE METABOLIC PANEL
ALT: 12 U/L (ref 0–44)
AST: 17 U/L (ref 15–41)
Albumin: 3.9 g/dL (ref 3.5–5.0)
Alkaline Phosphatase: 56 U/L (ref 38–126)
Anion gap: 10 (ref 5–15)
BUN: 13 mg/dL (ref 8–23)
CO2: 28 mmol/L (ref 22–32)
Calcium: 9.7 mg/dL (ref 8.9–10.3)
Chloride: 100 mmol/L (ref 98–111)
Creatinine, Ser: 0.7 mg/dL (ref 0.61–1.24)
GFR calc Af Amer: >60 mL/min (ref >60)
GFR calc non Af Amer: >60 mL/min (ref >60)
Glucose, Bld: 168 mg/dL — ABNORMAL HIGH (ref 70–99)
Potassium: 3.8 mmol/L (ref 3.5–5.1)
Sodium: 138 mmol/L (ref 135–145)
Total Bilirubin: 0.6 mg/dL (ref 0.3–1.2)
Total Protein: 6.6 g/dL (ref 6.5–8.1)

## 2018-05-06 MED ORDER — DENOSUMAB 120 MG/1.7ML ~~LOC~~ SOLN
120.0000 mg | Freq: Once | SUBCUTANEOUS | Status: AC
  Administered 2018-05-06: 120 mg via SUBCUTANEOUS
  Filled 2018-05-06: qty 1.7

## 2018-05-06 NOTE — Telephone Encounter (Signed)
PT APPROVED FOR J AND J PAP TIL 04/20/2019.

## 2018-05-06 NOTE — Progress Notes (Signed)
Xgeva per orders. See MAR, Patient tolerated it well without problems. Vitals stable and discharged home from clinic via wheelchair.  Follow up as scheduled.

## 2018-05-09 ENCOUNTER — Other Ambulatory Visit (HOSPITAL_COMMUNITY): Payer: Self-pay | Admitting: Emergency Medicine

## 2018-05-09 MED ORDER — PREDNISONE 5 MG PO TABS: 2.5000 mg | ORAL_TABLET | Freq: Two times a day (BID) | ORAL | 1 refills | Status: DC

## 2018-05-10 ENCOUNTER — Ambulatory Visit (INDEPENDENT_AMBULATORY_CARE_PROVIDER_SITE_OTHER): Payer: Medicare Other | Admitting: Urology

## 2018-05-10 ENCOUNTER — Ambulatory Visit (INDEPENDENT_AMBULATORY_CARE_PROVIDER_SITE_OTHER): Payer: Medicare Other | Admitting: Pharmacist

## 2018-05-10 DIAGNOSIS — C7951 Secondary malignant neoplasm of bone: Secondary | ICD-10-CM | POA: Diagnosis not present

## 2018-05-10 DIAGNOSIS — I4891 Unspecified atrial fibrillation: Secondary | ICD-10-CM

## 2018-05-10 DIAGNOSIS — J189 Pneumonia, unspecified organism: Secondary | ICD-10-CM | POA: Diagnosis not present

## 2018-05-10 DIAGNOSIS — Z5181 Encounter for therapeutic drug level monitoring: Secondary | ICD-10-CM | POA: Diagnosis not present

## 2018-05-10 DIAGNOSIS — E1151 Type 2 diabetes mellitus with diabetic peripheral angiopathy without gangrene: Secondary | ICD-10-CM | POA: Diagnosis not present

## 2018-05-10 DIAGNOSIS — C61 Malignant neoplasm of prostate: Secondary | ICD-10-CM | POA: Diagnosis not present

## 2018-05-10 DIAGNOSIS — F0390 Unspecified dementia without behavioral disturbance: Secondary | ICD-10-CM | POA: Diagnosis not present

## 2018-05-10 LAB — POCT INR: INR: 1.8 — AB (ref 2.0–3.0)

## 2018-05-12 DIAGNOSIS — E1151 Type 2 diabetes mellitus with diabetic peripheral angiopathy without gangrene: Secondary | ICD-10-CM | POA: Diagnosis not present

## 2018-05-12 DIAGNOSIS — F0390 Unspecified dementia without behavioral disturbance: Secondary | ICD-10-CM | POA: Diagnosis not present

## 2018-05-12 DIAGNOSIS — J189 Pneumonia, unspecified organism: Secondary | ICD-10-CM | POA: Diagnosis not present

## 2018-05-12 DIAGNOSIS — C7951 Secondary malignant neoplasm of bone: Secondary | ICD-10-CM | POA: Diagnosis not present

## 2018-05-12 DIAGNOSIS — C61 Malignant neoplasm of prostate: Secondary | ICD-10-CM | POA: Diagnosis not present

## 2018-05-12 DIAGNOSIS — I4891 Unspecified atrial fibrillation: Secondary | ICD-10-CM | POA: Diagnosis not present

## 2018-05-13 DIAGNOSIS — E1151 Type 2 diabetes mellitus with diabetic peripheral angiopathy without gangrene: Secondary | ICD-10-CM | POA: Diagnosis not present

## 2018-05-13 DIAGNOSIS — C7951 Secondary malignant neoplasm of bone: Secondary | ICD-10-CM | POA: Diagnosis not present

## 2018-05-13 DIAGNOSIS — J189 Pneumonia, unspecified organism: Secondary | ICD-10-CM | POA: Diagnosis not present

## 2018-05-13 DIAGNOSIS — I4891 Unspecified atrial fibrillation: Secondary | ICD-10-CM | POA: Diagnosis not present

## 2018-05-13 DIAGNOSIS — C61 Malignant neoplasm of prostate: Secondary | ICD-10-CM | POA: Diagnosis not present

## 2018-05-13 DIAGNOSIS — F0390 Unspecified dementia without behavioral disturbance: Secondary | ICD-10-CM | POA: Diagnosis not present

## 2018-05-16 DIAGNOSIS — E1142 Type 2 diabetes mellitus with diabetic polyneuropathy: Secondary | ICD-10-CM | POA: Diagnosis not present

## 2018-05-16 DIAGNOSIS — S90129A Contusion of unspecified lesser toe(s) without damage to nail, initial encounter: Secondary | ICD-10-CM | POA: Diagnosis not present

## 2018-05-18 ENCOUNTER — Encounter: Payer: Self-pay | Admitting: Cardiology

## 2018-05-18 ENCOUNTER — Ambulatory Visit (INDEPENDENT_AMBULATORY_CARE_PROVIDER_SITE_OTHER): Payer: Medicare Other | Admitting: Pharmacist

## 2018-05-18 ENCOUNTER — Ambulatory Visit (INDEPENDENT_AMBULATORY_CARE_PROVIDER_SITE_OTHER): Payer: Medicare Other | Admitting: Cardiology

## 2018-05-18 VITALS — BP 122/68 | HR 50 | Ht 70.0 in | Wt 159.0 lb

## 2018-05-18 DIAGNOSIS — Z5181 Encounter for therapeutic drug level monitoring: Secondary | ICD-10-CM | POA: Diagnosis not present

## 2018-05-18 DIAGNOSIS — F0390 Unspecified dementia without behavioral disturbance: Secondary | ICD-10-CM | POA: Diagnosis not present

## 2018-05-18 DIAGNOSIS — I480 Paroxysmal atrial fibrillation: Secondary | ICD-10-CM | POA: Diagnosis not present

## 2018-05-18 DIAGNOSIS — J189 Pneumonia, unspecified organism: Secondary | ICD-10-CM | POA: Diagnosis not present

## 2018-05-18 DIAGNOSIS — I10 Essential (primary) hypertension: Secondary | ICD-10-CM

## 2018-05-18 DIAGNOSIS — I4891 Unspecified atrial fibrillation: Secondary | ICD-10-CM | POA: Diagnosis not present

## 2018-05-18 DIAGNOSIS — E1151 Type 2 diabetes mellitus with diabetic peripheral angiopathy without gangrene: Secondary | ICD-10-CM | POA: Diagnosis not present

## 2018-05-18 DIAGNOSIS — C7951 Secondary malignant neoplasm of bone: Secondary | ICD-10-CM | POA: Diagnosis not present

## 2018-05-18 DIAGNOSIS — C61 Malignant neoplasm of prostate: Secondary | ICD-10-CM | POA: Diagnosis not present

## 2018-05-18 LAB — POCT INR: INR: 2.8 (ref 2.0–3.0)

## 2018-05-18 MED ORDER — DILTIAZEM HCL 90 MG PO TABS: ORAL_TABLET | ORAL | 3 refills | Status: DC

## 2018-05-18 MED ORDER — LISINOPRIL 20 MG PO TABS: 20.0000 mg | ORAL_TABLET | Freq: Every day | ORAL | 3 refills | Status: DC

## 2018-05-18 MED ORDER — FUROSEMIDE 20 MG PO TABS: 20.0000 mg | ORAL_TABLET | Freq: Every day | ORAL | 3 refills | Status: DC

## 2018-05-18 NOTE — Patient Instructions (Signed)
Description   Take 7.5mg  today (3 tablets) then continue 5mg  (2 tablets) daily except 7.5mg  (3 tablets) on Saturdays, Tuesdays, and Thursdays.  Recheck INR in 1 week.  Continue Ensure  - 1 per day

## 2018-05-18 NOTE — Patient Instructions (Signed)
Medication Instructions:   Your physician recommends that you continue on your current medications as directed. Please refer to the Current Medication list given to you today.  Labwork:  none  Testing/Procedures:  none  Follow-Up:  Your physician recommends that you schedule a follow-up appointment in: 4 months.   Any Other Special Instructions Will Be Listed Below (If Applicable).  If you need a refill on your cardiac medications before your next appointment, please call your pharmacy. 

## 2018-05-18 NOTE — Progress Notes (Signed)
Clinical Summary [name] is a 85 y.o.male seen today for follow up of the following medical problems.   1. Atrial fibrillation  - denies any palpitations. - compliant with meds. Has some interest in eliquis pending costs.     2. HTN  - compliant with meds   3. Prostate cancer with bone mets - followed by onc  4. Dizziness - mild orthostatic symptoms with standing - working to maintain hydration   5. Mild aortic stenosis - by echo 01/2018    SH: his wife Thaison Kolodziejski is also a patient of mine.    Past Medical History:  Diagnosis Date  . Acute bronchitis   . Arthritis   . Atrial fibrillation ([place])    Dr. Harl Bowie- [facility] follows saw 11'14  . Bladder stones    tx. with oral meds and antibiotics, now surgery planned  . Cataracts, both eyes    surgery planned May 2015  . Diabetes mellitus without complication ([place])    Type II  . Dyspnea    with activity  . Dysrhythmia    Afib  . Family history of breast cancer   . Family history of colon cancer   . GERD (gastroesophageal reflux disease)   . 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 Margot Chimes, thus far without recurrence.   . Hypertension   . NHL (non-Hodgkin's lymphoma) ([place]) 2005   Diffuse large B-cell lymphoma, clinically stage IIIA, CD20 positive, status post cervical lymph node biopsy 08/03/2003 on the left. PET scan was also positive in the spleen and small bowel region, but bone marrow aspiration and biopsy were negative. So he essentially had stage IIIAs. He received R-CHOP x6 cycles with CR established by PET scan criteria on 11/23/2003 with no evidence for relapse th  . PAD (peripheral artery disease) ([place]) 08/27/2017  . Skin cancer    Basal cell- face,head,  neck,  arms, legs, Back     Allergies  Allergen Reactions  . Tape Other (See Comments)    SKIN IS VERY THIN AND WILL TEAR AND BRUISE VERY EASILY!!!!  . Augmentin [Amoxicillin-Pot Clavulanate] Rash and Other (See Comments)    Redness of skin     Current Outpatient Medications  Medication Sig Dispense Refill  . abiraterone acetate (ZYTIGA) 250 MG tablet Take 2 tablets (500 mg total) by mouth daily. Take on an empty stomach 1 hour before or 2 hours after a meal 60 tablet 0  . acetaminophen (TYLENOL) 500 MG tablet Take 500-1,000 mg by mouth every 8 (eight) hours as needed for mild pain, fever or headache.     Marland Kitchen azithromycin (ZITHROMAX) 250 MG tablet Take 1 tablet (250 mg total) by mouth daily. 3 each 0  . Calcium Carb-Cholecalciferol (CALCIUM 600+D) 600-800 MG-UNIT TABS Take 1 tablet by mouth 2 (two) times daily.    . Calcium Polycarbophil (FIBER-CAPS PO) Take 1 capsule by mouth 2 (two) times daily.    . cefdinir (OMNICEF) 300 MG capsule Take 1 capsule (300 mg total) by mouth every 12 (twelve) hours. 13 capsule 0  . Cholecalciferol (VITAMIN D3) 1000 units CAPS Take 1,000 Units by mouth every morning.     . Cranberry-Vitamin C-Vitamin E (CRANBERRY PLUS VITAMIN C) 4200-20-3 MG-MG-UNIT CAPS Take 1 capsule by mouth every morning.    Marland Kitchen  Cyanocobalamin (VITAMIN B-12 IJ) Inject 1,000 mcg as directed every 30 (thirty) days.     Marland Kitchen denosumab (XGEVA) 120 MG/1.7ML SOLN injection Inject 120 mg into the skin every 30 (thirty) days.     Marland Kitchen diltiazem (CARDIZEM) 90 MG tablet TAKE 1 TABLET BY MOUTH TWICE DAILY -- **NEEDS APPOINTMENT FOR FURTHER REFILLS** (Patient taking differently: Take 90 mg by mouth 2 (two) times daily. ) 60 tablet 0  . diphenhydrAMINE-APAP, sleep, (TYLENOL PM EXTRA STRENGTH PO) Take 1 tablet by mouth at bedtime.     . donepezil (ARICEPT) 10 MG tablet Take 10 mg by mouth daily.    . feeding supplement, ENSURE ENLIVE, (ENSURE ENLIVE) LIQD Take 237 mLs by mouth 2 (two) times daily between  meals. CHOCOLATE    . furosemide (LASIX) 20 MG tablet Take 1 tablet (20 mg total) by mouth daily. (Patient taking differently: Take 20 mg by mouth every morning. ) 30 tablet 11  . glipiZIDE (GLUCOTROL XL) 5 MG 24 hr tablet Take 5 mg by mouth daily with breakfast.     . guaiFENesin (MUCINEX) 600 MG 12 hr tablet Take 600 mg by mouth 2 (two) times daily as needed for to loosen phlegm.    Marland Kitchen Leuprolide Acetate, 6 Month, (LUPRON DEPOT) 45 MG injection Inject 45 mg into the muscle every 6 (six) months.    Marland Kitchen lisinopril (PRINIVIL,ZESTRIL) 20 MG tablet Take 1 tablet (20 mg total) daily by mouth. (Patient taking differently: Take 20 mg by mouth every morning. ) 30 tablet 11  . loperamide (IMODIUM) 2 MG capsule Take 1 capsule (2 mg total) by mouth as needed for diarrhea or loose stools. 30 capsule 0  . metFORMIN (GLUCOPHAGE) 850 MG tablet Take 850 mg by mouth 2 (two) times daily with a meal.     . mirtazapine (REMERON) 7.5 MG tablet Take 7.5 mg by mouth at bedtime.    . Multiple Vitamin (MULTIVITAMIN WITH MINERALS) TABS tablet Take 1 tablet by mouth every morning.    . mupirocin ointment (BACTROBAN) 2 % Place 1 application into the nose 2 (two) times daily as needed (for any toe infections).    . nystatin (MYCOSTATIN) powder Apply 1 g topically 4 (four) times daily as needed (fungus).     Marland Kitchen omeprazole (PRILOSEC) 40 MG capsule Take 40 mg by mouth every other day.   3  . potassium chloride (K-DUR,KLOR-CON) 10 MEQ tablet Take 2 tablets (20 mEq total) by mouth daily. (Patient taking differently: Take 10 mEq by mouth 2 (two) times daily. ) 60 tablet 0  . predniSONE (DELTASONE) 5 MG tablet Take 0.5 tablets (2.5 mg total) by mouth 2 (two) times daily. 90 tablet 1  . simethicone (MYLICON) 80 MG chewable tablet Chew 80 mg by mouth See admin instructions. Take 80 mg by mouth in the evening and as needed for gas    . VENTOLIN HFA 108 (90 Base) MCG/ACT inhaler Inhale 1-2 puffs into the lungs every 6 (six) hours as needed  for wheezing or shortness of breath.     . warfarin (COUMADIN) 2.5 MG tablet Take 2 - 3 tablets daily as directed by the anticoagulation clinic (Patient taking differently: Take 5 mg by mouth at bedtime. ) 75 tablet 3   No current facility-administered medications for this visit.      Past Surgical History:  Procedure Laterality Date  . AMPUTATION Left 06/29/2017   Procedure: AMPUTATION LEFT GREAT TOE;  Surgeon: [name], MD;  Location: [place];  Service: Vascular;  Laterality: Left;  . AMPUTATION Left 08/27/2017   Procedure: AMPUTATION Left SECOND TOE;  Surgeon: [name], MD;  Location: [facility];  Service: Vascular;  Laterality: Left;  . CATARACT EXTRACTION Bilateral 2015  . CHOLECYSTECTOMY    . COLON RESECTION     small bowel  . CYSTOSCOPY WITH LITHOLAPAXY N/A 08/10/2013   Procedure: CYSTOSCOPY WITH LITHOLAPAXY WITH [name];  Surgeon: [name], MD;  Location: [facility];  Service: Urology;  Laterality: N/A;  . ESOPHAGEAL DILATION N/A 02/27/2016   Procedure: ESOPHAGEAL DILATION;  Surgeon: [name], MD;  [facility];  Service: Endoscopy;  Laterality: N/A;  . ESOPHAGOGASTRODUODENOSCOPY N/A 02/27/2016   Procedure: ESOPHAGOGASTRODUODENOSCOPY (EGD);  Surgeon: [name], MD;  [facility];  Service: Endoscopy;  Laterality: N/A;  12:00  . INCISION AND DRAINAGE PERIRECTAL ABSCESS    . LOWER EXTREMITY ANGIOGRAPHY N/A 06/25/2017   Procedure: LOWER EXTREMITY ANGIOGRAPHY;  Surgeon: [name], MD;  Location: [facility];  Service: Cardiovascular;  Laterality: N/A;  . LYMPH NODE DISSECTION Left    '05-neck  . PORT-A-CATH REMOVAL    . PORTACATH PLACEMENT     insertion and removal -last chemotherapy 10 yrs ago  . TRANSURETHRAL RESECTION OF PROSTATE N/A 08/10/2013   Procedure: TRANSURETHRAL RESECTION OF THE PROSTATE WITH GYRUS INSTRUMENTS;  Surgeon: [name], MD;  Location: [facility];  Service: Urology;  Laterality: N/A;      Allergies  Allergen Reactions  . Tape Other (See Comments)    SKIN IS VERY THIN AND WILL TEAR AND BRUISE VERY EASILY!!!!  . Augmentin [Amoxicillin-Pot Clavulanate] Rash and Other (See Comments)    Redness of skin      Family History  Problem Relation Age of Onset  . Heart attack Mother        died in her 77s  . Other Father 16       pneumonia - died  . Breast cancer Sister        dx in her 87s-60s  . Colon cancer Brother        dx in his 47s  . Cancer Sister        TAH/BSO due to "male cancer"  . Breast cancer Daughter 75  . Breast cancer Daughter 32       DCIS - ATM VUS on [employer] 83 gene panel in 2018  . Breast cancer Daughter 58       DCIS - Negative on [employer] MyRisk 25 gene apnel in 2015  . Thyroid cancer Daughter 57       Medullary and Papillary  . Thyroid nodules Grandchild      Social History Mr. Lampert reports that he has never smoked. He has never used smokeless tobacco. Mr. Livingstone reports no history of alcohol use.   Review of Systems CONSTITUTIONAL: No weight loss, fever, chills, weakness or fatigue.  HEENT: Eyes: No visual loss, blurred vision, double vision or yellow sclerae.No hearing loss, sneezing, congestion, runny nose or sore throat.  SKIN: No rash or itching.  CARDIOVASCULAR: per hpi RESPIRATORY: No shortness of breath, cough or sputum.  GASTROINTESTINAL: No anorexia, nausea, vomiting or diarrhea. No abdominal pain or blood.  GENITOURINARY: No burning on urination, no polyuria NEUROLOGICAL: No headache, dizziness, syncope, paralysis, ataxia, numbness or tingling in the extremities. No change in bowel or bladder control.  MUSCULOSKELETAL: No muscle, back pain, joint pain or stiffness.  LYMPHATICS: No enlarged nodes. No history of splenectomy.  PSYCHIATRIC: No history of  depression or anxiety.  ENDOCRINOLOGIC: No reports of sweating, cold or heat intolerance. No polyuria or polydipsia.  Marland Kitchen   Physical Examination Vitals:   05/18/18  1030  BP: 122/68  Pulse: (!) 50  SpO2: 98%   Vitals:   05/18/18 1030  Weight: 159 lb (72.1 kg)  Height: 5' 10" (1.778 m)    Gen: resting comfortably, no acute distress HEENT: no scleral icterus, pupils equal round and reactive, no palptable cervical adenopathy,  CV: irreg, 2/6 systolic murmur rusb, no jvd Resp: Clear to auscultation bilaterally GI: abdomen is soft, non-tender, non-distended, normal bowel sounds, no hepatosplenomegaly MSK: extremities are warm, no edema.  Skin: warm, no rash Neuro:  no focal deficits Psych: appropriate affect   Diagnostic Studies  01/2018 echo Study Conclusions  - Left ventricle: The cavity size was normal. Wall thickness was   increased in a pattern of mild LVH. Systolic function was normal.   The estimated ejection fraction was in the range of 55% to 60%.   Wall motion was normal; there were no regional wall motion   abnormalities. The study was not technically sufficient to allow   evaluation of LV diastolic dysfunction due to atrial   fibrillation. - Aortic valve: Moderately calcified annulus. Trileaflet; mildly   thickened, mildly calcified leaflets. Morphologically, there was   at least mild calcific aortic stenosis if not mild to moderate   stenosis. - Mitral valve: There was mild regurgitation. - Inferior vena cava: The vessel was dilated. The respirophasic   diameter changes were blunted (< 50%), consistent with elevated   central venous pressure. Estimated CVP 15 mmHg.   Assessment and Plan  1. Afib  - no symptoms - he has some interested in changing from coumadin to eliquis pending cost. We will see if he can qualify for any assistance. Dose would be 60m bid if changed based on age, weight, Cr.   2. HTN  - at goal, in general would accept higher bp targert for him due to advanced age and prior orthostatic symptoms.    F/u 4 months     JArnoldo Lenis M.D

## 2018-05-19 ENCOUNTER — Encounter (INDEPENDENT_AMBULATORY_CARE_PROVIDER_SITE_OTHER): Payer: Self-pay

## 2018-05-25 ENCOUNTER — Ambulatory Visit (INDEPENDENT_AMBULATORY_CARE_PROVIDER_SITE_OTHER): Payer: Medicare Other | Admitting: Pharmacist

## 2018-05-25 DIAGNOSIS — C7951 Secondary malignant neoplasm of bone: Secondary | ICD-10-CM | POA: Diagnosis not present

## 2018-05-25 DIAGNOSIS — Z5181 Encounter for therapeutic drug level monitoring: Secondary | ICD-10-CM | POA: Diagnosis not present

## 2018-05-25 DIAGNOSIS — I4891 Unspecified atrial fibrillation: Secondary | ICD-10-CM | POA: Diagnosis not present

## 2018-05-25 DIAGNOSIS — C61 Malignant neoplasm of prostate: Secondary | ICD-10-CM | POA: Diagnosis not present

## 2018-05-25 DIAGNOSIS — E1151 Type 2 diabetes mellitus with diabetic peripheral angiopathy without gangrene: Secondary | ICD-10-CM | POA: Diagnosis not present

## 2018-05-25 DIAGNOSIS — F0390 Unspecified dementia without behavioral disturbance: Secondary | ICD-10-CM | POA: Diagnosis not present

## 2018-05-25 DIAGNOSIS — J189 Pneumonia, unspecified organism: Secondary | ICD-10-CM | POA: Diagnosis not present

## 2018-05-25 LAB — POCT INR: INR: 2.8 (ref 2.0–3.0)

## 2018-05-31 DIAGNOSIS — E1151 Type 2 diabetes mellitus with diabetic peripheral angiopathy without gangrene: Secondary | ICD-10-CM | POA: Diagnosis not present

## 2018-05-31 DIAGNOSIS — I4891 Unspecified atrial fibrillation: Secondary | ICD-10-CM | POA: Diagnosis not present

## 2018-05-31 DIAGNOSIS — C61 Malignant neoplasm of prostate: Secondary | ICD-10-CM | POA: Diagnosis not present

## 2018-05-31 DIAGNOSIS — F0390 Unspecified dementia without behavioral disturbance: Secondary | ICD-10-CM | POA: Diagnosis not present

## 2018-05-31 DIAGNOSIS — J189 Pneumonia, unspecified organism: Secondary | ICD-10-CM | POA: Diagnosis not present

## 2018-05-31 DIAGNOSIS — C7951 Secondary malignant neoplasm of bone: Secondary | ICD-10-CM | POA: Diagnosis not present

## 2018-06-01 DIAGNOSIS — F0390 Unspecified dementia without behavioral disturbance: Secondary | ICD-10-CM | POA: Diagnosis not present

## 2018-06-01 DIAGNOSIS — C7951 Secondary malignant neoplasm of bone: Secondary | ICD-10-CM | POA: Diagnosis not present

## 2018-06-01 DIAGNOSIS — I4891 Unspecified atrial fibrillation: Secondary | ICD-10-CM | POA: Diagnosis not present

## 2018-06-01 DIAGNOSIS — J189 Pneumonia, unspecified organism: Secondary | ICD-10-CM | POA: Diagnosis not present

## 2018-06-01 DIAGNOSIS — E1151 Type 2 diabetes mellitus with diabetic peripheral angiopathy without gangrene: Secondary | ICD-10-CM | POA: Diagnosis not present

## 2018-06-01 DIAGNOSIS — C61 Malignant neoplasm of prostate: Secondary | ICD-10-CM | POA: Diagnosis not present

## 2018-06-02 ENCOUNTER — Other Ambulatory Visit: Payer: Self-pay | Admitting: Cardiology

## 2018-06-03 ENCOUNTER — Inpatient Hospital Stay (HOSPITAL_COMMUNITY): Payer: Medicare Other

## 2018-06-03 ENCOUNTER — Inpatient Hospital Stay (HOSPITAL_BASED_OUTPATIENT_CLINIC_OR_DEPARTMENT_OTHER): Payer: Medicare Other | Admitting: Hematology

## 2018-06-03 ENCOUNTER — Encounter (HOSPITAL_COMMUNITY): Payer: Self-pay | Admitting: Hematology

## 2018-06-03 ENCOUNTER — Other Ambulatory Visit: Payer: Self-pay

## 2018-06-03 ENCOUNTER — Inpatient Hospital Stay (HOSPITAL_COMMUNITY): Payer: Medicare Other | Attending: Hematology

## 2018-06-03 DIAGNOSIS — Z79899 Other long term (current) drug therapy: Secondary | ICD-10-CM | POA: Diagnosis not present

## 2018-06-03 DIAGNOSIS — C7951 Secondary malignant neoplasm of bone: Secondary | ICD-10-CM | POA: Diagnosis not present

## 2018-06-03 DIAGNOSIS — C61 Malignant neoplasm of prostate: Secondary | ICD-10-CM | POA: Insufficient documentation

## 2018-06-03 DIAGNOSIS — I4891 Unspecified atrial fibrillation: Secondary | ICD-10-CM

## 2018-06-03 DIAGNOSIS — E119 Type 2 diabetes mellitus without complications: Secondary | ICD-10-CM | POA: Diagnosis not present

## 2018-06-03 DIAGNOSIS — Z7984 Long term (current) use of oral hypoglycemic drugs: Secondary | ICD-10-CM | POA: Diagnosis not present

## 2018-06-03 DIAGNOSIS — Z9221 Personal history of antineoplastic chemotherapy: Secondary | ICD-10-CM | POA: Insufficient documentation

## 2018-06-03 DIAGNOSIS — Z7901 Long term (current) use of anticoagulants: Secondary | ICD-10-CM | POA: Diagnosis not present

## 2018-06-03 DIAGNOSIS — D472 Monoclonal gammopathy: Secondary | ICD-10-CM | POA: Insufficient documentation

## 2018-06-03 DIAGNOSIS — E1165 Type 2 diabetes mellitus with hyperglycemia: Secondary | ICD-10-CM | POA: Diagnosis not present

## 2018-06-03 DIAGNOSIS — I10 Essential (primary) hypertension: Secondary | ICD-10-CM | POA: Diagnosis not present

## 2018-06-03 LAB — COMPREHENSIVE METABOLIC PANEL
ALT: 17 U/L (ref 0–44)
AST: 19 U/L (ref 15–41)
Albumin: 4.1 g/dL (ref 3.5–5.0)
Alkaline Phosphatase: 74 U/L (ref 38–126)
Anion gap: 10 (ref 5–15)
BUN: 20 mg/dL (ref 8–23)
CO2: 28 mmol/L (ref 22–32)
Calcium: 10.7 mg/dL — ABNORMAL HIGH (ref 8.9–10.3)
Chloride: 102 mmol/L (ref 98–111)
Creatinine, Ser: 0.75 mg/dL (ref 0.61–1.24)
GFR calc Af Amer: >60 mL/min (ref >60)
GFR calc non Af Amer: >60 mL/min (ref >60)
Glucose, Bld: 160 mg/dL — ABNORMAL HIGH (ref 70–99)
Potassium: 3.7 mmol/L (ref 3.5–5.1)
Sodium: 140 mmol/L (ref 135–145)
Total Bilirubin: 0.7 mg/dL (ref 0.3–1.2)
Total Protein: 7 g/dL (ref 6.5–8.1)

## 2018-06-03 LAB — CBC WITH DIFFERENTIAL/PLATELET
Abs Immature Granulocytes: 0.34 10*3/uL — ABNORMAL HIGH (ref 0.00–0.07)
Basophils Absolute: 0 10*3/uL (ref 0.0–0.1)
Basophils Relative: 0 %
Eosinophils Absolute: 0.1 10*3/uL (ref 0.0–0.5)
Eosinophils Relative: 1 %
HCT: 40.1 % (ref 39.0–52.0)
Hemoglobin: 13.5 g/dL (ref 13.0–17.0)
Immature Granulocytes: 5 %
Lymphocytes Relative: 32 %
Lymphs Abs: 2.3 10*3/uL (ref 0.7–4.0)
MCH: 30.9 pg (ref 26.0–34.0)
MCHC: 33.7 g/dL (ref 30.0–36.0)
MCV: 91.8 fL (ref 80.0–100.0)
Monocytes Absolute: 1.1 10*3/uL — ABNORMAL HIGH (ref 0.1–1.0)
Monocytes Relative: 16 %
Neutro Abs: 3.3 10*3/uL (ref 1.7–7.7)
Neutrophils Relative %: 46 %
Platelets: 178 10*3/uL (ref 150–400)
RBC: 4.37 MIL/uL (ref 4.22–5.81)
RDW: 13.3 % (ref 11.5–15.5)
WBC: 7.2 10*3/uL (ref 4.0–10.5)
nRBC: 0 % (ref 0.0–0.2)

## 2018-06-03 LAB — PSA: Prostatic Specific Antigen: <0.01 ng/mL (ref 0.00–4.00)

## 2018-06-03 MED ORDER — DENOSUMAB 120 MG/1.7ML ~~LOC~~ SOLN
120.0000 mg | Freq: Once | SUBCUTANEOUS | Status: AC
  Administered 2018-06-03: 120 mg via SUBCUTANEOUS
  Filled 2018-06-03: qty 1.7

## 2018-06-03 NOTE — Patient Instructions (Signed)
[facility] at [facility] Discharge Instructions     Thank you for choosing  [facility] at [facility] to provide your oncology and hematology care.  To afford each patient quality time with our provider, please arrive at least 15 minutes before your scheduled appointment time.   If you have a lab appointment with the Cancer Center please come in thru the  Main Entrance and check in at the main information desk  You need to re-schedule your appointment should you arrive 10 or more minutes late.  We strive to give you quality time with our providers, and arriving late affects you and other patients whose appointments are after yours.  Also, if you no show three or more times for appointments you may be dismissed from the clinic at the providers discretion.     Again, thank you for choosing [facility].  Our hope is that these requests will decrease the amount of time that you wait before being seen by our physicians.       _____________________________________________________________  Should you have questions after your visit to [facility], please contact our office at [phone] between the hours of 8:00 a.m. and 4:30 p.m.  Voicemails left after 4:00 p.m. will not be returned until the following business day.  For prescription refill requests, have your pharmacy contact our office and allow 72 hours.    Cancer Center Support Programs:   > Cancer Support Group  2nd Tuesday of the month 1pm-2pm, Journey Room    

## 2018-06-03 NOTE — Assessment & Plan Note (Addendum)
1.  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 -Started on Abiraterone and prednisone in October 2016, tolerating very well.  He is on every 104-month Lupron injections.  Last Lupron injection was on 09/06/2017.  Last PSA was down to 0.01 on the same day.  Last bone scan dated 08/02/2017  shows interval improvement in T12 lesion.  No new lesions were seen. -Hospitalization from 01/31/2018 through 02/03/2018 for mental status changes and aspiration pneumonia. - Abiraterone was dose reduced to 5 mg once a day and prednisone 2.5 mg twice daily on 02/10/2018. -Lupron injection 45 mg on 03/11/2018. -Last PSA from 04/08/2018 was undetectable.  PSA level from today is pending. - He was hospitalized from 04/16/2018 through 04/18/2018 with community-acquired pneumonia. - His energy levels have normalized.  He will continue same dose of Abiraterone and prednisone. -I will reevaluate him in 2 months.  2.  Bone strengthening: -His calcium is slightly high at 10.7 today.  He will receive denosumab today. -I have told him to cut back on calcium to once a day.   3.  IgM MGUS: -Last M spike was 0.3 g/dL.  Will check it prior to next visit.   4.  DL BCL: He was originally diagnosed in 2005.  He does not have any symptoms or lymphadenopathy on the scan.  5.  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  No evidence of recurrence.

## 2018-06-03 NOTE — Progress Notes (Signed)
Patient tolerated injection with no complaints voiced.  Band aid applied.  Left by wheelchair with family with no s/s of distress noted.

## 2018-06-03 NOTE — Progress Notes (Signed)
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  The Multi-Gene Panel offered by [facility] includes sequencing and/or deletion duplication testing of the following 80 genes: ALK, APC, ATM, AXIN2,BAP1,  BARD1, BLM, BMPR1A, BRCA1, BRCA2, BRIP1, CASR, CDC73, CDH1, CDK4, CDKN1B, CDKN1C, CDKN2A (p14ARF), CDKN2A (p16INK4a), CEBPA, CHEK2, CTNNA1, DICER1, DIS3L2, EGFR (c.2369C>T, p.Thr790Met variant only), EPCAM (Deletion/duplication testing only), FH, FLCN, GATA2, GPC3, GREM1 (Promoter region deletion/duplication testing only), HOXB13 (c.251G>A, p.Gly84Glu), HRAS, KIT, MAX, MEN1, MET, MITF (c.952G>A, p.Glu318Lys variant only), MLH1, MSH2, MSH3, MSH6, MUTYH, NBN, NF1, NF2, NTHL1, PALB2, PDGFRA, PHOX2B, PMS2, POLD1, POLE, POT1, PRKAR1A, PTCH1, PTEN, RAD50, RAD51C, RAD51D, RB1, RECQL4, RET, RUNX1, SDHAF2, SDHA (sequence changes only), SDHB, SDHC, SDHD, SMAD4, SMARCA4, SMARCB1, SMARCE1, STK11, SUFU, TERT, TERT, TMEM127, TP53, TSC1, TSC2, VHL, WRN and WT1.  The report date is February 08, 2017.      Prostate Foster ([place])   09/08/2013 Initial Diagnosis    Prostate Foster ([place])    02/08/2017 Genetic Testing    ATM Gain (Exons 62-63) VUS identified on the multi-gene panel.  The Multi-Gene Panel offered by [facility] includes sequencing and/or deletion duplication testing of the following 80 genes: ALK, APC, ATM, AXIN2,BAP1,  BARD1, BLM, BMPR1A, BRCA1, BRCA2, BRIP1, CASR,  CDC73, CDH1, CDK4, CDKN1B, CDKN1C, CDKN2A (p14ARF), CDKN2A (p16INK4a), CEBPA, CHEK2, CTNNA1, DICER1, DIS3L2, EGFR (c.2369C>T, p.Thr790Met variant only), EPCAM (Deletion/duplication testing only), FH, FLCN, GATA2, GPC3, GREM1 (Promoter region deletion/duplication testing only), HOXB13 (c.251G>A, p.Gly84Glu), HRAS, KIT, MAX, MEN1, MET, MITF (c.952G>A, p.Glu318Lys variant only), MLH1, MSH2, MSH3, MSH6, MUTYH, NBN, NF1, NF2, NTHL1, PALB2, PDGFRA, PHOX2B, PMS2, POLD1, POLE, POT1, PRKAR1A, PTCH1, PTEN, RAD50, RAD51C, RAD51D, RB1, RECQL4, RET, RUNX1, SDHAF2, SDHA (sequence changes only), SDHB, SDHC, SDHD, SMAD4, SMARCA4, SMARCB1, SMARCE1, STK11, SUFU, TERT, TERT, TMEM127, TP53, TSC1, TSC2, VHL, WRN and WT1.  The report date is February 08, 2017.       Foster STAGING: Foster Staging NHL (non-Hodgkin's lymphoma) (HCC) Staging form: Lymphoid Neoplasms, AJCC 6th Edition - Clinical: Stage III - Signed by [name] Cancer, PA on 08/18/2011    INTERVAL HISTORY:  [name] 85 y.o. 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 He is here today with his sister. He reports being fatigued during the day. He had ankle pain last week but it is improved now. He denies any injury to the ankle. Denies any nausea, vomiting, or diarrhea. Denies any new pains. Had not noticed any recent bleeding such as epistaxis, hematuria or hematochezia. Denies recent chest pain on exertion, shortness of breath on minimal exertion, pre-syncopal episodes, or palpitations. Denies any numbness or tingling in hands or feet. Denies any recent fevers, infections, or recent hospitalizations. Patient reports appetite at 75% and energy level at 50%.   REVIEW OF SYSTEMS:  Review of Systems  Constitutional: Positive for fatigue.  All other systems reviewed and are negative.    PAST MEDICAL/SURGICAL HISTORY:  Past Medical History:  Diagnosis Date  . Acute bronchitis   . Arthritis   .  Atrial fibrillation ([place])     Dr. Harl Foster- Foster follows saw 11'14  . Bladder stones    tx. with oral meds and antibiotics, now surgery planned  . Cataracts, both eyes    surgery planned May 2015  . Diabetes mellitus without complication ([place])    Type II  . Dyspnea    with activity  . Dysrhythmia    Afib  . Family history of breast Foster   . Family history of colon Foster   . GERD (gastroesophageal reflux disease)   . 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 Margot Foster, thus far without recurrence.   . Hypertension   . NHL (non-Hodgkin's lymphoma) ([place]) 2005   Diffuse large B-cell lymphoma, clinically stage IIIA, CD20 positive, status post cervical lymph node biopsy 08/03/2003 on the left. PET scan was also positive in the spleen and small bowel region, but bone marrow aspiration and biopsy were negative. So he essentially had stage IIIAs. He received R-CHOP x6 cycles with CR established by PET scan criteria on 11/23/2003 with no evidence for relapse th  . PAD (peripheral artery disease) ([place]) 08/27/2017  . Skin Foster    Basal cell- face,head, neck,  arms, legs, Back   Past Surgical History:  Procedure Laterality Date  . AMPUTATION Left 06/29/2017   Procedure: AMPUTATION LEFT GREAT TOE;  Surgeon: [name] Dutch, MD;  Location: [facility];  Service: Vascular;  Laterality: Left;  . AMPUTATION Left 08/27/2017   Procedure: AMPUTATION Left SECOND TOE;  Surgeon: [name] Dutch, MD;  Location: [facility];  Service: Vascular;  Laterality: Left;  . CATARACT EXTRACTION Bilateral 2015  . CHOLECYSTECTOMY    . COLON RESECTION     small bowel  . CYSTOSCOPY WITH LITHOLAPAXY N/A 08/10/2013   Procedure: CYSTOSCOPY WITH LITHOLAPAXY WITH [name];  Surgeon: [name] Gallo, MD;  [facility];  Service: Urology;  Laterality: N/A;  . ESOPHAGEAL DILATION N/A 02/27/2016   Procedure: ESOPHAGEAL DILATION;  Surgeon: [name] Houston, MD;  [facility];  Service: Endoscopy;  Laterality: N/A;  . ESOPHAGOGASTRODUODENOSCOPY N/A 02/27/2016   Procedure: ESOPHAGOGASTRODUODENOSCOPY (EGD);  Surgeon: [name] Houston, MD;  [facility];  Service: Endoscopy;  Laterality: N/A;  12:00  . INCISION AND DRAINAGE PERIRECTAL ABSCESS    . LOWER EXTREMITY ANGIOGRAPHY N/A 06/25/2017   Procedure: LOWER EXTREMITY ANGIOGRAPHY;  Surgeon: [name] Dutch, MD;  Location: [facility];  Service: Cardiovascular;  Laterality: N/A;  . LYMPH NODE DISSECTION Left    '05-neck  . PORT-A-CATH REMOVAL    . PORTACATH PLACEMENT     insertion and removal -last chemotherapy 10 yrs ago  . TRANSURETHRAL RESECTION OF PROSTATE N/A 08/10/2013   Procedure: TRANSURETHRAL RESECTION OF THE PROSTATE WITH GYRUS INSTRUMENTS;  Surgeon: [name] Gallo, MD;  Location[facility];  Service: Urology;  Laterality: N/A;     SOCIAL HISTORY:  Social History   Socioeconomic History  . Marital status: Married    Spouse name: Not on file  . Number of children: Not on file  . Years of education: Not on file  . Highest education level: Not on file  Occupational History  . Occupation: retired    Fish farm manager: RETIRED    Comment: salesman  Social Needs  . Financial resource strain: Not  on file  . Food insecurity:    Worry: Not on file    Inability: Not on file  . Transportation needs:    Medical: Not on file    Non-medical: Not on file  Tobacco Use  . Smoking status: Never Smoker  . Smokeless tobacco: Never Used  Substance and Sexual Activity  . Alcohol use: No  . Drug use: No  . Sexual activity: Not Currently  Lifestyle  . Physical activity:    Days per week: Not on file    Minutes per session: Not on file  . Stress: Not on file  Relationships  . Social connections:    Talks on phone: Not on file     Gets together: Not on file    Attends religious service: Not on file    Active member of club or organization: Not on file    Attends meetings of clubs or organizations: Not on file    Relationship status: Not on file  . Intimate partner violence:    Fear of current or ex partner: Not on file    Emotionally abused: Not on file    Physically abused: Not on file    Forced sexual activity: Not on file  Other Topics Concern  . Not on file  Social History Narrative  . Not on file    FAMILY HISTORY:  Family History  Problem Relation Age of Onset  . Heart attack Mother        died in her 77s  . Other Father 31       pneumonia - died  . Breast Foster Sister        dx in her 32s-60s  . Colon Foster Brother        dx in his 43s  . Foster Sister        TAH/BSO due to "male Foster"  . Breast Foster Daughter 55  . Breast Foster Daughter 52       DCIS - ATM VUS on [facility] 83 gene panel in 2018  . Breast Foster Daughter 61       DCIS - Negative on [facility] MyRisk 25 gene apnel in 2015  . Thyroid Foster Daughter 84       Medullary and Papillary  . Thyroid nodules Grandchild     CURRENT MEDICATIONS:  Outpatient Encounter Medications as of 06/03/2018  Medication Sig  . abiraterone acetate (ZYTIGA) 250 MG tablet Take 2 tablets (500 mg total) by mouth daily. Take on an empty stomach 1 hour before or 2 hours after a meal  . Calcium Carb-Cholecalciferol (CALCIUM 600+D) 600-800 MG-UNIT TABS Take 1 tablet by mouth 2 (two) times daily.  . Calcium Polycarbophil (FIBER-CAPS PO) Take 1 capsule by mouth 2 (two) times daily.  . Cholecalciferol (VITAMIN D3) 1000 units CAPS Take 1,000 Units by mouth every morning.   . Cranberry-Vitamin C-Vitamin E (CRANBERRY PLUS VITAMIN C) 4200-20-3 MG-MG-UNIT CAPS Take 1 capsule by mouth every morning.  . Cyanocobalamin (VITAMIN B-12 IJ) Inject 1,000 mcg as directed every 30 (thirty) days.   Marland Kitchen denosumab (XGEVA) 120 MG/1.7ML SOLN injection Inject 120 mg into the skin  every 30 (thirty) days.   Marland Kitchen diltiazem (CARDIZEM) 90 MG tablet TAKE 1 TABLET BY MOUTH TWICE DAILY -- **NEEDS APPOINTMENT FOR FURTHER REFILLS**  . diphenhydrAMINE-APAP, sleep, (TYLENOL PM EXTRA STRENGTH PO) Take 1 tablet by mouth at bedtime.   . donepezil (ARICEPT) 10 MG tablet Take 10 mg by mouth daily.  . feeding supplement, ENSURE ENLIVE, (  ENSURE ENLIVE) LIQD Take 237 mLs by mouth daily. CHOCOLATE   . furosemide (LASIX) 20 MG tablet Take 1 tablet (20 mg total) by mouth daily.  Marland Kitchen glipiZIDE (GLUCOTROL XL) 5 MG 24 hr tablet Take 5 mg by mouth daily with breakfast.   . Leuprolide Acetate, 6 Month, (LUPRON DEPOT) 45 MG injection Inject 45 mg into the muscle every 6 (six) months.  Marland Kitchen lisinopril (PRINIVIL,ZESTRIL) 20 MG tablet Take 1 tablet (20 mg total) by mouth daily.  . metFORMIN (GLUCOPHAGE) 850 MG tablet Take 850 mg by mouth 2 (two) times daily with a meal.   . mirtazapine (REMERON) 7.5 MG tablet Take 7.5 mg by mouth at bedtime.  . Multiple Vitamin (MULTIVITAMIN WITH MINERALS) TABS tablet Take 1 tablet by mouth every morning.  Marland Kitchen omeprazole (PRILOSEC) 40 MG capsule Take 40 mg by mouth every other day.   . potassium chloride (K-DUR,KLOR-CON) 10 MEQ tablet Take 2 tablets (20 mEq total) by mouth daily. (Patient taking differently: Take 10 mEq by mouth 2 (two) times daily. )  . predniSONE (DELTASONE) 5 MG tablet Take 0.5 tablets (2.5 mg total) by mouth 2 (two) times daily.  Marland Kitchen warfarin (COUMADIN) 2.5 MG tablet TAKE 2 TO 3 TABLETS BY MOUTH ONCE DAILY AS DIRECTED BY  THE  ANTICOAGULATION  CLINIC  . acetaminophen (TYLENOL) 500 MG tablet Take 500-1,000 mg by mouth every 8 (eight) hours as needed for mild pain, fever or headache.   . guaiFENesin (MUCINEX) 600 MG 12 hr tablet Take 600 mg by mouth 2 (two) times daily as needed for to loosen phlegm.  . loperamide (IMODIUM) 2 MG capsule Take 1 capsule (2 mg total) by mouth as needed for diarrhea or loose stools. (Patient not taking: Reported on 06/03/2018)  .  nystatin (MYCOSTATIN) powder Apply 1 g topically 4 (four) times daily as needed (fungus).   . [DISCONTINUED] mupirocin ointment (BACTROBAN) 2 % Place 1 application into the nose 2 (two) times daily as needed (for any toe infections).  . [DISCONTINUED] simethicone (MYLICON) 80 MG chewable tablet Chew 80 mg by mouth See admin instructions. Take 80 mg by mouth in the evening and as needed for gas  . [DISCONTINUED] VENTOLIN HFA 108 (90 Base) MCG/ACT inhaler Inhale 1-2 puffs into the lungs every 6 (six) hours as needed for wheezing or shortness of breath.    No facility-administered encounter medications on file as of 06/03/2018.     ALLERGIES:  Allergies  Allergen Reactions  . Tape Other (See Comments)    SKIN IS VERY THIN AND WILL TEAR AND BRUISE VERY EASILY!!!!  . Augmentin [Amoxicillin-Pot Clavulanate] Rash and Other (See Comments)    Redness of skin     PHYSICAL EXAM:  ECOG Performance status: 1  Vitals:   06/03/18 1000  BP: (!) 143/76  Pulse: 75  Resp: 16  SpO2: 100%   Filed Weights   06/03/18 1000  Weight: 151 lb (68.5 kg)    Physical Exam Constitutional:      Appearance: Normal appearance. He is normal weight.  Cardiovascular:     Rate and Rhythm: Normal rate and regular rhythm.     Heart sounds: Normal heart sounds.  Pulmonary:     Effort: Pulmonary effort is normal.     Breath sounds: Normal breath sounds.  Musculoskeletal: Normal range of motion.  Skin:    General: Skin is warm and dry.  Neurological:     Mental Status: He is alert and oriented to person, place, and time. Mental status  is at baseline.  Psychiatric:        Mood and Affect: Mood normal.        Behavior: Behavior normal.        Thought Content: Thought content normal.        Judgment: Judgment normal.      LABORATORY DATA:  I have reviewed the labs as listed.  CBC    Component Value Date/Time   WBC 7.2 06/03/2018 1007   RBC 4.37 06/03/2018 1007   HGB 13.5 06/03/2018 1007   HCT 40.1  06/03/2018 1007   PLT 178 06/03/2018 1007   MCV 91.8 06/03/2018 1007   MCH 30.9 06/03/2018 1007   MCHC 33.7 06/03/2018 1007   RDW 13.3 06/03/2018 1007   LYMPHSABS 2.3 06/03/2018 1007   MONOABS 1.1 (H) 06/03/2018 1007   EOSABS 0.1 06/03/2018 1007   BASOSABS 0.0 06/03/2018 1007   CMP Latest Ref Rng & Units 06/03/2018 05/06/2018 04/18/2018  Glucose 70 - 99 mg/dL 160(H) 168(H) 151(H)  BUN 8 - 23 mg/dL _0 Creatinine 0.61 - 1.24 mg/dL 0.75 0.70 0.69  Sodium 135 - 145 mmol/L 140 138 140  Potassium 3.5 - 5.1 mmol/L 3.7 3.8 3.2(L)  Chloride 98 - 111 mmol/L 102 100 109  CO2 22 - 32 mmol/L _1 Calcium 8.9 - 10.3 mg/dL 10.7(H) 9.7 8.2(L)  Total Protein 6.5 - 8.1 g/dL 7.0 6.6 -  Total Bilirubin 0.3 - 1.2 mg/dL 0.7 0.6 -  Alkaline Phos 38 - 126 U/L 74 56 -  AST 15 - 41 U/L 19 17 -  ALT 0 - 44 U/L 17 12 -       DIAGNOSTIC IMAGING:  I have independently reviewed the scans and discussed with the patient.   I have reviewed Francene Finders, NP's note and agree with the documentation.  I personally performed a face-to-face visit, made revisions and my assessment and plan is as follows.    ASSESSMENT & PLAN:   Prostate Foster ([place]) 1.  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 -Started on Abiraterone and prednisone in October 2016, tolerating very well.  He is on every 13-monthLupron injections.  Last Lupron injection was on 09/06/2017.  Last PSA was down to 0.01 on the same day.  Last bone scan dated 08/02/2017  shows interval improvement in T12 lesion.  No new lesions were seen. -Hospitalization from 01/31/2018 through 02/03/2018 for mental status changes and aspiration pneumonia. - Abiraterone was dose reduced to 5 mg once a day and prednisone 2.5 mg twice daily on 02/10/2018. -Lupron injection 45 mg on 03/11/2018. -Last PSA from 04/08/2018 was undetectable.  PSA level from today is pending. - He was hospitalized from 04/16/2018 through 04/18/2018  with community-acquired pneumonia. - His energy levels have normalized.  He will continue same dose of Abiraterone and prednisone. -I will reevaluate him in 2 months.  2.  Bone strengthening: -His calcium is slightly high at 10.7 today.  He will receive denosumab today. -I have told him to cut back on calcium to once a day.   3.  IgM MGUS: -Last M spike was 0.3 g/dL.  Will check it prior to next visit.   4.  DL BCL: He was originally diagnosed in 2005.  He does not have any symptoms or lymphadenopathy on the scan.  5.  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  No evidence of recurrence.  Orders placed this encounter:  Orders Placed This Encounter  Procedures  . PSA  . CBC with Differential/Platelet  . Comprehensive metabolic panel  . Protein electrophoresis, serum      [name], MD [place] [phone]

## 2018-06-08 ENCOUNTER — Ambulatory Visit (INDEPENDENT_AMBULATORY_CARE_PROVIDER_SITE_OTHER): Payer: Medicare Other | Admitting: *Deleted

## 2018-06-08 ENCOUNTER — Telehealth: Payer: Self-pay | Admitting: *Deleted

## 2018-06-08 DIAGNOSIS — C8580 Other specified types of non-Hodgkin lymphoma, unspecified site: Secondary | ICD-10-CM | POA: Diagnosis not present

## 2018-06-08 DIAGNOSIS — J189 Pneumonia, unspecified organism: Secondary | ICD-10-CM | POA: Diagnosis not present

## 2018-06-08 DIAGNOSIS — C61 Malignant neoplasm of prostate: Secondary | ICD-10-CM | POA: Diagnosis not present

## 2018-06-08 DIAGNOSIS — E1151 Type 2 diabetes mellitus with diabetic peripheral angiopathy without gangrene: Secondary | ICD-10-CM | POA: Diagnosis not present

## 2018-06-08 DIAGNOSIS — F0390 Unspecified dementia without behavioral disturbance: Secondary | ICD-10-CM | POA: Diagnosis not present

## 2018-06-08 DIAGNOSIS — I480 Paroxysmal atrial fibrillation: Secondary | ICD-10-CM

## 2018-06-08 DIAGNOSIS — E43 Unspecified severe protein-calorie malnutrition: Secondary | ICD-10-CM | POA: Diagnosis not present

## 2018-06-08 DIAGNOSIS — I10 Essential (primary) hypertension: Secondary | ICD-10-CM | POA: Diagnosis not present

## 2018-06-08 DIAGNOSIS — I4891 Unspecified atrial fibrillation: Secondary | ICD-10-CM | POA: Diagnosis not present

## 2018-06-08 DIAGNOSIS — Z5181 Encounter for therapeutic drug level monitoring: Secondary | ICD-10-CM

## 2018-06-08 DIAGNOSIS — C7951 Secondary malignant neoplasm of bone: Secondary | ICD-10-CM | POA: Diagnosis not present

## 2018-06-08 DIAGNOSIS — E1165 Type 2 diabetes mellitus with hyperglycemia: Secondary | ICD-10-CM | POA: Diagnosis not present

## 2018-06-08 LAB — POCT INR: INR: 2.8 (ref 2.0–3.0)

## 2018-06-08 NOTE — Telephone Encounter (Signed)
LMOM for [name] with coumadin orders.  See coumadin note.

## 2018-06-08 NOTE — Telephone Encounter (Signed)
INR 2.8   Per Kathlee Nations  [phone]

## 2018-06-08 NOTE — Patient Instructions (Signed)
Continue 5mg  (2 tablets) daily except 7.5mg  (3 tablets) on Saturdays, Tuesdays, and Thursdays.  Recheck INR in 2 weeks Continue Ensure  - 1 per day Order given to [employer] with [facility]

## 2018-06-10 ENCOUNTER — Telehealth: Payer: Self-pay | Admitting: Cardiology

## 2018-06-10 DIAGNOSIS — J189 Pneumonia, unspecified organism: Secondary | ICD-10-CM | POA: Diagnosis not present

## 2018-06-10 DIAGNOSIS — E1151 Type 2 diabetes mellitus with diabetic peripheral angiopathy without gangrene: Secondary | ICD-10-CM | POA: Diagnosis not present

## 2018-06-10 DIAGNOSIS — F0390 Unspecified dementia without behavioral disturbance: Secondary | ICD-10-CM | POA: Diagnosis not present

## 2018-06-10 DIAGNOSIS — I4891 Unspecified atrial fibrillation: Secondary | ICD-10-CM | POA: Diagnosis not present

## 2018-06-10 DIAGNOSIS — C7951 Secondary malignant neoplasm of bone: Secondary | ICD-10-CM | POA: Diagnosis not present

## 2018-06-10 DIAGNOSIS — C61 Malignant neoplasm of prostate: Secondary | ICD-10-CM | POA: Diagnosis not present

## 2018-06-10 NOTE — Telephone Encounter (Signed)
Calling to check on Eliquis patient assistance application. / tg

## 2018-06-13 DIAGNOSIS — E1142 Type 2 diabetes mellitus with diabetic polyneuropathy: Secondary | ICD-10-CM | POA: Diagnosis not present

## 2018-06-13 DIAGNOSIS — B351 Tinea unguium: Secondary | ICD-10-CM | POA: Diagnosis not present

## 2018-06-13 NOTE — Telephone Encounter (Signed)
Spoke with Jaclynn at [facility]. Patient has been approved for assistance through the end of this year.  Called to notify patient. No answer. Left msg to call back.

## 2018-06-13 NOTE — Telephone Encounter (Signed)
Can stop coumadin when he has the eliquis, wait 3 days and then start eliquis 5mg  bid   Zandra Abts MD

## 2018-06-13 NOTE — Telephone Encounter (Signed)
Daughter [name] notified and voiced understanding  

## 2018-06-14 DIAGNOSIS — I4891 Unspecified atrial fibrillation: Secondary | ICD-10-CM | POA: Diagnosis not present

## 2018-06-14 DIAGNOSIS — C61 Malignant neoplasm of prostate: Secondary | ICD-10-CM | POA: Diagnosis not present

## 2018-06-14 DIAGNOSIS — E1151 Type 2 diabetes mellitus with diabetic peripheral angiopathy without gangrene: Secondary | ICD-10-CM | POA: Diagnosis not present

## 2018-06-14 DIAGNOSIS — C7951 Secondary malignant neoplasm of bone: Secondary | ICD-10-CM | POA: Diagnosis not present

## 2018-06-14 DIAGNOSIS — F0390 Unspecified dementia without behavioral disturbance: Secondary | ICD-10-CM | POA: Diagnosis not present

## 2018-06-14 DIAGNOSIS — J189 Pneumonia, unspecified organism: Secondary | ICD-10-CM | POA: Diagnosis not present

## 2018-06-15 ENCOUNTER — Telehealth: Payer: Self-pay | Admitting: *Deleted

## 2018-06-15 DIAGNOSIS — J189 Pneumonia, unspecified organism: Secondary | ICD-10-CM | POA: Diagnosis not present

## 2018-06-15 DIAGNOSIS — C7951 Secondary malignant neoplasm of bone: Secondary | ICD-10-CM | POA: Diagnosis not present

## 2018-06-15 DIAGNOSIS — C61 Malignant neoplasm of prostate: Secondary | ICD-10-CM | POA: Diagnosis not present

## 2018-06-15 DIAGNOSIS — E1151 Type 2 diabetes mellitus with diabetic peripheral angiopathy without gangrene: Secondary | ICD-10-CM | POA: Diagnosis not present

## 2018-06-15 DIAGNOSIS — I4891 Unspecified atrial fibrillation: Secondary | ICD-10-CM | POA: Diagnosis not present

## 2018-06-15 DIAGNOSIS — F0390 Unspecified dementia without behavioral disturbance: Secondary | ICD-10-CM | POA: Diagnosis not present

## 2018-06-15 NOTE — Telephone Encounter (Signed)
Daughter [name] notified that patient assistance Eliquis has arrived in office. Daughter reminded to stop coumadin, wait 3 days, then start Eliquis. Daughter voiced understanding.

## 2018-06-16 DIAGNOSIS — J189 Pneumonia, unspecified organism: Secondary | ICD-10-CM | POA: Diagnosis not present

## 2018-06-16 DIAGNOSIS — F0390 Unspecified dementia without behavioral disturbance: Secondary | ICD-10-CM | POA: Diagnosis not present

## 2018-06-16 DIAGNOSIS — E1151 Type 2 diabetes mellitus with diabetic peripheral angiopathy without gangrene: Secondary | ICD-10-CM | POA: Diagnosis not present

## 2018-06-16 DIAGNOSIS — C7951 Secondary malignant neoplasm of bone: Secondary | ICD-10-CM | POA: Diagnosis not present

## 2018-06-16 DIAGNOSIS — I4891 Unspecified atrial fibrillation: Secondary | ICD-10-CM | POA: Diagnosis not present

## 2018-06-16 DIAGNOSIS — C61 Malignant neoplasm of prostate: Secondary | ICD-10-CM | POA: Diagnosis not present

## 2018-06-18 DIAGNOSIS — Z8572 Personal history of non-Hodgkin lymphomas: Secondary | ICD-10-CM | POA: Diagnosis not present

## 2018-06-18 DIAGNOSIS — I4891 Unspecified atrial fibrillation: Secondary | ICD-10-CM | POA: Diagnosis not present

## 2018-06-18 DIAGNOSIS — C7951 Secondary malignant neoplasm of bone: Secondary | ICD-10-CM | POA: Diagnosis not present

## 2018-06-18 DIAGNOSIS — Z8509 Personal history of malignant neoplasm of other digestive organs: Secondary | ICD-10-CM | POA: Diagnosis not present

## 2018-06-18 DIAGNOSIS — Z66 Do not resuscitate: Secondary | ICD-10-CM | POA: Diagnosis not present

## 2018-06-18 DIAGNOSIS — Z89422 Acquired absence of other left toe(s): Secondary | ICD-10-CM | POA: Diagnosis not present

## 2018-06-18 DIAGNOSIS — Z7984 Long term (current) use of oral hypoglycemic drugs: Secondary | ICD-10-CM | POA: Diagnosis not present

## 2018-06-18 DIAGNOSIS — M6281 Muscle weakness (generalized): Secondary | ICD-10-CM | POA: Diagnosis not present

## 2018-06-18 DIAGNOSIS — Z5181 Encounter for therapeutic drug level monitoring: Secondary | ICD-10-CM | POA: Diagnosis not present

## 2018-06-18 DIAGNOSIS — G9340 Encephalopathy, unspecified: Secondary | ICD-10-CM | POA: Diagnosis not present

## 2018-06-18 DIAGNOSIS — Z7901 Long term (current) use of anticoagulants: Secondary | ICD-10-CM | POA: Diagnosis not present

## 2018-06-18 DIAGNOSIS — Z89412 Acquired absence of left great toe: Secondary | ICD-10-CM | POA: Diagnosis not present

## 2018-06-18 DIAGNOSIS — M150 Primary generalized (osteo)arthritis: Secondary | ICD-10-CM | POA: Diagnosis not present

## 2018-06-18 DIAGNOSIS — I10 Essential (primary) hypertension: Secondary | ICD-10-CM | POA: Diagnosis not present

## 2018-06-18 DIAGNOSIS — F0390 Unspecified dementia without behavioral disturbance: Secondary | ICD-10-CM | POA: Diagnosis not present

## 2018-06-18 DIAGNOSIS — Z7952 Long term (current) use of systemic steroids: Secondary | ICD-10-CM | POA: Diagnosis not present

## 2018-06-18 DIAGNOSIS — E1151 Type 2 diabetes mellitus with diabetic peripheral angiopathy without gangrene: Secondary | ICD-10-CM | POA: Diagnosis not present

## 2018-06-18 DIAGNOSIS — C61 Malignant neoplasm of prostate: Secondary | ICD-10-CM | POA: Diagnosis not present

## 2018-06-18 DIAGNOSIS — R2681 Unsteadiness on feet: Secondary | ICD-10-CM | POA: Diagnosis not present

## 2018-06-20 ENCOUNTER — Telehealth: Payer: Self-pay | Admitting: *Deleted

## 2018-06-20 DIAGNOSIS — C61 Malignant neoplasm of prostate: Secondary | ICD-10-CM | POA: Diagnosis not present

## 2018-06-20 DIAGNOSIS — E1151 Type 2 diabetes mellitus with diabetic peripheral angiopathy without gangrene: Secondary | ICD-10-CM | POA: Diagnosis not present

## 2018-06-20 DIAGNOSIS — C7951 Secondary malignant neoplasm of bone: Secondary | ICD-10-CM | POA: Diagnosis not present

## 2018-06-20 DIAGNOSIS — Z7984 Long term (current) use of oral hypoglycemic drugs: Secondary | ICD-10-CM | POA: Diagnosis not present

## 2018-06-20 DIAGNOSIS — F0390 Unspecified dementia without behavioral disturbance: Secondary | ICD-10-CM | POA: Diagnosis not present

## 2018-06-20 DIAGNOSIS — I4891 Unspecified atrial fibrillation: Secondary | ICD-10-CM | POA: Diagnosis not present

## 2018-06-20 NOTE — Telephone Encounter (Signed)
Please give pt's daughter [name] a call [phone]

## 2018-06-20 NOTE — Telephone Encounter (Signed)
Called and spoke to [place].  Pt started Eliquis last night.  Wanted to know about follow up process.  Explained and appointment made for 1 month f/u on 08/01/2018.  She verbalized understanding.

## 2018-06-22 DIAGNOSIS — C7951 Secondary malignant neoplasm of bone: Secondary | ICD-10-CM | POA: Diagnosis not present

## 2018-06-22 DIAGNOSIS — E1151 Type 2 diabetes mellitus with diabetic peripheral angiopathy without gangrene: Secondary | ICD-10-CM | POA: Diagnosis not present

## 2018-06-22 DIAGNOSIS — I4891 Unspecified atrial fibrillation: Secondary | ICD-10-CM | POA: Diagnosis not present

## 2018-06-22 DIAGNOSIS — C61 Malignant neoplasm of prostate: Secondary | ICD-10-CM | POA: Diagnosis not present

## 2018-06-22 DIAGNOSIS — Z7984 Long term (current) use of oral hypoglycemic drugs: Secondary | ICD-10-CM | POA: Diagnosis not present

## 2018-06-22 DIAGNOSIS — F0390 Unspecified dementia without behavioral disturbance: Secondary | ICD-10-CM | POA: Diagnosis not present

## 2018-06-25 DIAGNOSIS — F0390 Unspecified dementia without behavioral disturbance: Secondary | ICD-10-CM | POA: Diagnosis not present

## 2018-06-25 DIAGNOSIS — Z7984 Long term (current) use of oral hypoglycemic drugs: Secondary | ICD-10-CM | POA: Diagnosis not present

## 2018-06-25 DIAGNOSIS — C7951 Secondary malignant neoplasm of bone: Secondary | ICD-10-CM | POA: Diagnosis not present

## 2018-06-25 DIAGNOSIS — I4891 Unspecified atrial fibrillation: Secondary | ICD-10-CM | POA: Diagnosis not present

## 2018-06-25 DIAGNOSIS — E1151 Type 2 diabetes mellitus with diabetic peripheral angiopathy without gangrene: Secondary | ICD-10-CM | POA: Diagnosis not present

## 2018-06-25 DIAGNOSIS — C61 Malignant neoplasm of prostate: Secondary | ICD-10-CM | POA: Diagnosis not present

## 2018-06-27 DIAGNOSIS — E1151 Type 2 diabetes mellitus with diabetic peripheral angiopathy without gangrene: Secondary | ICD-10-CM | POA: Diagnosis not present

## 2018-06-27 DIAGNOSIS — C61 Malignant neoplasm of prostate: Secondary | ICD-10-CM | POA: Diagnosis not present

## 2018-06-27 DIAGNOSIS — C7951 Secondary malignant neoplasm of bone: Secondary | ICD-10-CM | POA: Diagnosis not present

## 2018-06-27 DIAGNOSIS — Z7984 Long term (current) use of oral hypoglycemic drugs: Secondary | ICD-10-CM | POA: Diagnosis not present

## 2018-06-27 DIAGNOSIS — F0390 Unspecified dementia without behavioral disturbance: Secondary | ICD-10-CM | POA: Diagnosis not present

## 2018-06-27 DIAGNOSIS — I4891 Unspecified atrial fibrillation: Secondary | ICD-10-CM | POA: Diagnosis not present

## 2018-06-29 DIAGNOSIS — Z7984 Long term (current) use of oral hypoglycemic drugs: Secondary | ICD-10-CM | POA: Diagnosis not present

## 2018-06-29 DIAGNOSIS — E1151 Type 2 diabetes mellitus with diabetic peripheral angiopathy without gangrene: Secondary | ICD-10-CM | POA: Diagnosis not present

## 2018-06-29 DIAGNOSIS — N390 Urinary tract infection, site not specified: Secondary | ICD-10-CM | POA: Diagnosis not present

## 2018-06-29 DIAGNOSIS — I4891 Unspecified atrial fibrillation: Secondary | ICD-10-CM | POA: Diagnosis not present

## 2018-06-29 DIAGNOSIS — C7951 Secondary malignant neoplasm of bone: Secondary | ICD-10-CM | POA: Diagnosis not present

## 2018-06-29 DIAGNOSIS — F0390 Unspecified dementia without behavioral disturbance: Secondary | ICD-10-CM | POA: Diagnosis not present

## 2018-06-29 DIAGNOSIS — C61 Malignant neoplasm of prostate: Secondary | ICD-10-CM | POA: Diagnosis not present

## 2018-07-01 ENCOUNTER — Other Ambulatory Visit (HOSPITAL_COMMUNITY): Payer: Self-pay

## 2018-07-01 DIAGNOSIS — C7951 Secondary malignant neoplasm of bone: Secondary | ICD-10-CM

## 2018-07-02 ENCOUNTER — Emergency Department (HOSPITAL_COMMUNITY): Payer: Medicare Other

## 2018-07-02 ENCOUNTER — Encounter (HOSPITAL_COMMUNITY): Payer: Self-pay

## 2018-07-02 ENCOUNTER — Inpatient Hospital Stay (HOSPITAL_COMMUNITY)
Admission: EM | Admit: 2018-07-02 | Discharge: 2018-07-08 | DRG: 871 | Disposition: A | Discharge status: Home Health | Payer: Medicare Other | Attending: Internal Medicine | Admitting: Internal Medicine

## 2018-07-02 ENCOUNTER — Other Ambulatory Visit: Payer: Self-pay

## 2018-07-02 DIAGNOSIS — R4702 Dysphasia: Secondary | ICD-10-CM | POA: Diagnosis present

## 2018-07-02 DIAGNOSIS — J690 Pneumonitis due to inhalation of food and vomit: Secondary | ICD-10-CM | POA: Diagnosis present

## 2018-07-02 DIAGNOSIS — K449 Diaphragmatic hernia without obstruction or gangrene: Secondary | ICD-10-CM | POA: Diagnosis present

## 2018-07-02 DIAGNOSIS — G9341 Metabolic encephalopathy: Secondary | ICD-10-CM | POA: Diagnosis present

## 2018-07-02 DIAGNOSIS — N390 Urinary tract infection, site not specified: Secondary | ICD-10-CM | POA: Diagnosis not present

## 2018-07-02 DIAGNOSIS — R1314 Dysphagia, pharyngoesophageal phase: Secondary | ICD-10-CM | POA: Diagnosis present

## 2018-07-02 DIAGNOSIS — E1151 Type 2 diabetes mellitus with diabetic peripheral angiopathy without gangrene: Secondary | ICD-10-CM | POA: Diagnosis present

## 2018-07-02 DIAGNOSIS — Z8701 Personal history of pneumonia (recurrent): Secondary | ICD-10-CM

## 2018-07-02 DIAGNOSIS — E119 Type 2 diabetes mellitus without complications: Secondary | ICD-10-CM

## 2018-07-02 DIAGNOSIS — F0390 Unspecified dementia without behavioral disturbance: Secondary | ICD-10-CM | POA: Diagnosis present

## 2018-07-02 DIAGNOSIS — Z8500 Personal history of malignant neoplasm of unspecified digestive organ: Secondary | ICD-10-CM | POA: Diagnosis not present

## 2018-07-02 DIAGNOSIS — Z881 Allergy status to other antibiotic agents status: Secondary | ICD-10-CM

## 2018-07-02 DIAGNOSIS — I739 Peripheral vascular disease, unspecified: Secondary | ICD-10-CM | POA: Diagnosis present

## 2018-07-02 DIAGNOSIS — Z85828 Personal history of other malignant neoplasm of skin: Secondary | ICD-10-CM

## 2018-07-02 DIAGNOSIS — A4181 Sepsis due to Enterococcus: Principal | ICD-10-CM | POA: Diagnosis present

## 2018-07-02 DIAGNOSIS — E876 Hypokalemia: Secondary | ICD-10-CM | POA: Diagnosis present

## 2018-07-02 DIAGNOSIS — C49A0 Gastrointestinal stromal tumor, unspecified site: Secondary | ICD-10-CM | POA: Diagnosis not present

## 2018-07-02 DIAGNOSIS — Z800 Family history of malignant neoplasm of digestive organs: Secondary | ICD-10-CM | POA: Diagnosis not present

## 2018-07-02 DIAGNOSIS — Z9181 History of falling: Secondary | ICD-10-CM

## 2018-07-02 DIAGNOSIS — C8590 Non-Hodgkin lymphoma, unspecified, unspecified site: Secondary | ICD-10-CM | POA: Diagnosis not present

## 2018-07-02 DIAGNOSIS — Z803 Family history of malignant neoplasm of breast: Secondary | ICD-10-CM

## 2018-07-02 DIAGNOSIS — G629 Polyneuropathy, unspecified: Secondary | ICD-10-CM | POA: Diagnosis present

## 2018-07-02 DIAGNOSIS — Z9841 Cataract extraction status, right eye: Secondary | ICD-10-CM

## 2018-07-02 DIAGNOSIS — F05 Delirium due to known physiological condition: Secondary | ICD-10-CM | POA: Diagnosis present

## 2018-07-02 DIAGNOSIS — R933 Abnormal findings on diagnostic imaging of other parts of digestive tract: Secondary | ICD-10-CM | POA: Diagnosis not present

## 2018-07-02 DIAGNOSIS — A419 Sepsis, unspecified organism: Secondary | ICD-10-CM

## 2018-07-02 DIAGNOSIS — J189 Pneumonia, unspecified organism: Secondary | ICD-10-CM | POA: Diagnosis not present

## 2018-07-02 DIAGNOSIS — I4891 Unspecified atrial fibrillation: Secondary | ICD-10-CM | POA: Diagnosis not present

## 2018-07-02 DIAGNOSIS — C7951 Secondary malignant neoplasm of bone: Secondary | ICD-10-CM | POA: Diagnosis present

## 2018-07-02 DIAGNOSIS — Z91048 Other nonmedicinal substance allergy status: Secondary | ICD-10-CM

## 2018-07-02 DIAGNOSIS — Z79899 Other long term (current) drug therapy: Secondary | ICD-10-CM

## 2018-07-02 DIAGNOSIS — C61 Malignant neoplasm of prostate: Secondary | ICD-10-CM | POA: Diagnosis not present

## 2018-07-02 DIAGNOSIS — C8330 Diffuse large B-cell lymphoma, unspecified site: Secondary | ICD-10-CM | POA: Diagnosis present

## 2018-07-02 DIAGNOSIS — Z808 Family history of malignant neoplasm of other organs or systems: Secondary | ICD-10-CM

## 2018-07-02 DIAGNOSIS — Z853 Personal history of malignant neoplasm of breast: Secondary | ICD-10-CM

## 2018-07-02 DIAGNOSIS — R05 Cough: Secondary | ICD-10-CM | POA: Diagnosis not present

## 2018-07-02 DIAGNOSIS — N3000 Acute cystitis without hematuria: Secondary | ICD-10-CM

## 2018-07-02 DIAGNOSIS — K219 Gastro-esophageal reflux disease without esophagitis: Secondary | ICD-10-CM | POA: Diagnosis present

## 2018-07-02 DIAGNOSIS — D472 Monoclonal gammopathy: Secondary | ICD-10-CM | POA: Diagnosis present

## 2018-07-02 DIAGNOSIS — R4182 Altered mental status, unspecified: Secondary | ICD-10-CM | POA: Diagnosis not present

## 2018-07-02 DIAGNOSIS — R509 Fever, unspecified: Secondary | ICD-10-CM | POA: Diagnosis not present

## 2018-07-02 DIAGNOSIS — R000 Tachycardia, unspecified: Secondary | ICD-10-CM | POA: Diagnosis not present

## 2018-07-02 DIAGNOSIS — Y95 Nosocomial condition: Secondary | ICD-10-CM | POA: Diagnosis present

## 2018-07-02 DIAGNOSIS — K222 Esophageal obstruction: Secondary | ICD-10-CM | POA: Diagnosis present

## 2018-07-02 DIAGNOSIS — I480 Paroxysmal atrial fibrillation: Secondary | ICD-10-CM | POA: Diagnosis not present

## 2018-07-02 DIAGNOSIS — Z8249 Family history of ischemic heart disease and other diseases of the circulatory system: Secondary | ICD-10-CM

## 2018-07-02 DIAGNOSIS — Z9049 Acquired absence of other specified parts of digestive tract: Secondary | ICD-10-CM

## 2018-07-02 DIAGNOSIS — E1169 Type 2 diabetes mellitus with other specified complication: Secondary | ICD-10-CM | POA: Diagnosis not present

## 2018-07-02 DIAGNOSIS — Z9221 Personal history of antineoplastic chemotherapy: Secondary | ICD-10-CM

## 2018-07-02 DIAGNOSIS — I10 Essential (primary) hypertension: Secondary | ICD-10-CM | POA: Diagnosis not present

## 2018-07-02 DIAGNOSIS — Z7984 Long term (current) use of oral hypoglycemic drugs: Secondary | ICD-10-CM

## 2018-07-02 DIAGNOSIS — Z9842 Cataract extraction status, left eye: Secondary | ICD-10-CM

## 2018-07-02 DIAGNOSIS — Z89412 Acquired absence of left great toe: Secondary | ICD-10-CM

## 2018-07-02 DIAGNOSIS — R0902 Hypoxemia: Secondary | ICD-10-CM | POA: Diagnosis not present

## 2018-07-02 DIAGNOSIS — Z7901 Long term (current) use of anticoagulants: Secondary | ICD-10-CM | POA: Diagnosis not present

## 2018-07-02 DIAGNOSIS — Z7952 Long term (current) use of systemic steroids: Secondary | ICD-10-CM

## 2018-07-02 DIAGNOSIS — R059 Cough, unspecified: Secondary | ICD-10-CM

## 2018-07-02 DIAGNOSIS — Z89422 Acquired absence of other left toe(s): Secondary | ICD-10-CM

## 2018-07-02 DIAGNOSIS — G63 Polyneuropathy in diseases classified elsewhere: Secondary | ICD-10-CM

## 2018-07-02 DIAGNOSIS — Z66 Do not resuscitate: Secondary | ICD-10-CM | POA: Diagnosis present

## 2018-07-02 DIAGNOSIS — K224 Dyskinesia of esophagus: Secondary | ICD-10-CM | POA: Diagnosis not present

## 2018-07-02 HISTORY — DX: Sepsis, unspecified organism: A41.9

## 2018-07-02 LAB — CBC
HCT: 40.9 % (ref 39.0–52.0)
Hemoglobin: 14.1 g/dL (ref 13.0–17.0)
MCH: 31.6 pg (ref 26.0–34.0)
MCHC: 34.5 g/dL (ref 30.0–36.0)
MCV: 91.7 fL (ref 80.0–100.0)
Platelets: 155 10*3/uL (ref 150–400)
RBC: 4.46 MIL/uL (ref 4.22–5.81)
RDW: 13.2 % (ref 11.5–15.5)
WBC: 13.1 10*3/uL — ABNORMAL HIGH (ref 4.0–10.5)
nRBC: 0 % (ref 0.0–0.2)

## 2018-07-02 LAB — BASIC METABOLIC PANEL
Anion gap: 9 (ref 5–15)
BUN: 19 mg/dL (ref 8–23)
CO2: 28 mmol/L (ref 22–32)
Calcium: 10 mg/dL (ref 8.9–10.3)
Chloride: 102 mmol/L (ref 98–111)
Creatinine, Ser: 0.87 mg/dL (ref 0.61–1.24)
GFR calc Af Amer: >60 mL/min (ref >60)
GFR calc non Af Amer: >60 mL/min (ref >60)
Glucose, Bld: 141 mg/dL — ABNORMAL HIGH (ref 70–99)
Potassium: 3.5 mmol/L (ref 3.5–5.1)
Sodium: 139 mmol/L (ref 135–145)

## 2018-07-02 LAB — URINALYSIS, ROUTINE W REFLEX MICROSCOPIC
Bacteria, UA: NONE SEEN
Bilirubin Urine: NEGATIVE
Glucose, UA: NEGATIVE mg/dL
Ketones, ur: NEGATIVE mg/dL
Leukocytes,Ua: NEGATIVE
Nitrite: NEGATIVE
Protein, ur: NEGATIVE mg/dL
Specific Gravity, Urine: 1.015 (ref 1.005–1.030)
pH: 5 (ref 5.0–8.0)

## 2018-07-02 LAB — INFLUENZA PANEL BY PCR (TYPE A & B)
Influenza A By PCR: NEGATIVE
Influenza B By PCR: NEGATIVE

## 2018-07-02 LAB — GLUCOSE, CAPILLARY
Glucose-Capillary: 99 mg/dL (ref 70–99)
Glucose-Capillary: 92 mg/dL (ref 70–99)

## 2018-07-02 LAB — LACTIC ACID, PLASMA
Lactic Acid, Venous: 2.9 mmol/L (ref 0.5–1.9)
Lactic Acid, Venous: 2.7 mmol/L (ref 0.5–1.9)
Lactic Acid, Venous: 2.4 mmol/L (ref 0.5–1.9)

## 2018-07-02 LAB — PROTIME-INR
INR: 1.2 (ref 0.8–1.2)
Prothrombin Time: 14.9 seconds (ref 11.4–15.2)

## 2018-07-02 MED ORDER — INSULIN ASPART 100 UNIT/ML ~~LOC~~ SOLN
0.0000 [IU] | Freq: Every day | SUBCUTANEOUS | Status: DC
  Administered 2018-07-04: 2 [IU] via SUBCUTANEOUS
  Administered 2018-07-07: 1 [IU] via SUBCUTANEOUS

## 2018-07-02 MED ORDER — LISINOPRIL 10 MG PO TABS
20.0000 mg | ORAL_TABLET | Freq: Every day | ORAL | Status: DC
  Administered 2018-07-03 – 2018-07-08 (×6): 20 mg via ORAL
  Filled 2018-07-02 (×6): qty 2

## 2018-07-02 MED ORDER — DONEPEZIL HCL 5 MG PO TABS
10.0000 mg | ORAL_TABLET | Freq: Every day | ORAL | Status: DC
  Administered 2018-07-03 – 2018-07-07 (×6): 10 mg via ORAL
  Filled 2018-07-02 (×6): qty 2

## 2018-07-02 MED ORDER — SODIUM CHLORIDE 0.9 % IV SOLN
INTRAVENOUS | Status: AC
  Administered 2018-07-02 – 2018-07-03 (×2): via INTRAVENOUS

## 2018-07-02 MED ORDER — CRANBERRY-VITAMIN C-VITAMIN E 4200-20-3 MG-MG-UNIT PO CAPS: 1.0000 | ORAL_CAPSULE | Freq: Every morning | ORAL | Status: DC

## 2018-07-02 MED ORDER — LOPERAMIDE HCL 2 MG PO CAPS: 2.0000 mg | ORAL_CAPSULE | ORAL | Status: DC | PRN

## 2018-07-02 MED ORDER — VANCOMYCIN HCL 10 G IV SOLR
1250.0000 mg | Freq: Once | INTRAVENOUS | Status: DC
  Filled 2018-07-02: qty 1250

## 2018-07-02 MED ORDER — POTASSIUM CHLORIDE CRYS ER 20 MEQ PO TBCR
20.0000 meq | EXTENDED_RELEASE_TABLET | Freq: Every day | ORAL | Status: DC
  Administered 2018-07-03 – 2018-07-08 (×6): 20 meq via ORAL
  Filled 2018-07-02 (×6): qty 1

## 2018-07-02 MED ORDER — ENOXAPARIN SODIUM 40 MG/0.4ML ~~LOC~~ SOLN
40.0000 mg | SUBCUTANEOUS | Status: DC
  Administered 2018-07-02: 40 mg via SUBCUTANEOUS
  Filled 2018-07-02: qty 0.4

## 2018-07-02 MED ORDER — ONDANSETRON HCL 4 MG/2ML IJ SOLN
4.0000 mg | Freq: Four times a day (QID) | INTRAMUSCULAR | Status: DC | PRN
  Administered 2018-07-03: 4 mg via INTRAVENOUS
  Filled 2018-07-02: qty 2

## 2018-07-02 MED ORDER — SODIUM CHLORIDE 0.9 % IV BOLUS
1000.0000 mL | Freq: Once | INTRAVENOUS | Status: AC
  Administered 2018-07-02: 1000 mL via INTRAVENOUS

## 2018-07-02 MED ORDER — ADULT MULTIVITAMIN W/MINERALS CH
1.0000 | ORAL_TABLET | Freq: Every morning | ORAL | Status: DC
  Administered 2018-07-03 – 2018-07-08 (×6): 1 via ORAL
  Filled 2018-07-02 (×6): qty 1

## 2018-07-02 MED ORDER — APIXABAN 5 MG PO TABS
5.0000 mg | ORAL_TABLET | Freq: Two times a day (BID) | ORAL | Status: DC
  Administered 2018-07-03 – 2018-07-04 (×5): 5 mg via ORAL
  Filled 2018-07-02 (×6): qty 1

## 2018-07-02 MED ORDER — VANCOMYCIN HCL IN DEXTROSE 750-5 MG/150ML-% IV SOLN
750.0000 mg | Freq: Two times a day (BID) | INTRAVENOUS | Status: DC
  Administered 2018-07-03 – 2018-07-04 (×3): 750 mg via INTRAVENOUS
  Filled 2018-07-02 (×3): qty 150

## 2018-07-02 MED ORDER — SODIUM CHLORIDE 0.9% FLUSH
3.0000 mL | Freq: Two times a day (BID) | INTRAVENOUS | Status: DC
  Administered 2018-07-03 – 2018-07-08 (×9): 3 mL via INTRAVENOUS

## 2018-07-02 MED ORDER — ONDANSETRON HCL 4 MG PO TABS: 4.0000 mg | ORAL_TABLET | Freq: Four times a day (QID) | ORAL | Status: DC | PRN

## 2018-07-02 MED ORDER — PREDNISONE 5 MG PO TABS
2.5000 mg | ORAL_TABLET | Freq: Two times a day (BID) | ORAL | Status: DC
  Administered 2018-07-03 – 2018-07-08 (×12): 2.5 mg via ORAL
  Filled 2018-07-02 (×12): qty 1

## 2018-07-02 MED ORDER — SODIUM CHLORIDE 0.9 % IV SOLN: 1.0000 g | Freq: Three times a day (TID) | INTRAVENOUS | Status: DC

## 2018-07-02 MED ORDER — MIRTAZAPINE 15 MG PO TABS
7.5000 mg | ORAL_TABLET | Freq: Every day | ORAL | Status: DC
  Administered 2018-07-03 – 2018-07-07 (×6): 7.5 mg via ORAL
  Filled 2018-07-02 (×6): qty 1

## 2018-07-02 MED ORDER — CALCIUM CARBONATE-VITAMIN D 500-200 MG-UNIT PO TABS
1.0000 | ORAL_TABLET | Freq: Every day | ORAL | Status: DC
  Administered 2018-07-03 – 2018-07-08 (×6): 1 via ORAL
  Filled 2018-07-02 (×8): qty 1

## 2018-07-02 MED ORDER — ABIRATERONE ACETATE 250 MG PO TABS
500.0000 mg | ORAL_TABLET | Freq: Every day | ORAL | Status: DC
  Administered 2018-07-03 – 2018-07-08 (×6): 500 mg via ORAL

## 2018-07-02 MED ORDER — FLUTICASONE PROPIONATE 50 MCG/ACT NA SUSP
2.0000 | Freq: Every day | NASAL | Status: DC
  Administered 2018-07-03 – 2018-07-08 (×6): 2 via NASAL
  Filled 2018-07-02 (×2): qty 16

## 2018-07-02 MED ORDER — FUROSEMIDE 20 MG PO TABS
20.0000 mg | ORAL_TABLET | Freq: Every day | ORAL | Status: DC
  Administered 2018-07-03 – 2018-07-08 (×6): 20 mg via ORAL
  Filled 2018-07-02 (×6): qty 1

## 2018-07-02 MED ORDER — ENSURE ENLIVE PO LIQD
237.0000 mL | Freq: Every day | ORAL | Status: DC
  Administered 2018-07-04 – 2018-07-08 (×4): 237 mL via ORAL

## 2018-07-02 MED ORDER — ACETAMINOPHEN 325 MG PO TABS
650.0000 mg | ORAL_TABLET | Freq: Four times a day (QID) | ORAL | Status: DC | PRN
  Administered 2018-07-03 – 2018-07-05 (×6): 650 mg via ORAL
  Filled 2018-07-02 (×6): qty 2

## 2018-07-02 MED ORDER — ACETAMINOPHEN 500 MG PO TABS
1000.0000 mg | ORAL_TABLET | Freq: Once | ORAL | Status: AC
  Administered 2018-07-02: 1000 mg via ORAL
  Filled 2018-07-02: qty 2

## 2018-07-02 MED ORDER — CALCIUM POLYCARBOPHIL 625 MG PO TABS
625.0000 mg | ORAL_TABLET | Freq: Two times a day (BID) | ORAL | Status: DC
  Administered 2018-07-03 – 2018-07-08 (×11): 625 mg via ORAL
  Filled 2018-07-02 (×18): qty 1

## 2018-07-02 MED ORDER — INSULIN ASPART 100 UNIT/ML ~~LOC~~ SOLN
0.0000 [IU] | Freq: Three times a day (TID) | SUBCUTANEOUS | Status: DC
  Administered 2018-07-04: 2 [IU] via SUBCUTANEOUS
  Administered 2018-07-04 – 2018-07-05 (×2): 3 [IU] via SUBCUTANEOUS
  Administered 2018-07-05: 2 [IU] via SUBCUTANEOUS
  Administered 2018-07-05: 5 [IU] via SUBCUTANEOUS
  Administered 2018-07-06 – 2018-07-07 (×4): 2 [IU] via SUBCUTANEOUS
  Administered 2018-07-07: 3 [IU] via SUBCUTANEOUS
  Administered 2018-07-08 (×2): 2 [IU] via SUBCUTANEOUS

## 2018-07-02 MED ORDER — GUAIFENESIN ER 600 MG PO TB12: 600.0000 mg | ORAL_TABLET | Freq: Two times a day (BID) | ORAL | Status: DC | PRN

## 2018-07-02 MED ORDER — VITAMIN D 25 MCG (1000 UNIT) PO TABS
1000.0000 [IU] | ORAL_TABLET | Freq: Every morning | ORAL | Status: DC
  Administered 2018-07-03 – 2018-07-08 (×6): 1000 [IU] via ORAL
  Filled 2018-07-02 (×6): qty 1

## 2018-07-02 MED ORDER — ACETAMINOPHEN 650 MG RE SUPP: 650.0000 mg | Freq: Four times a day (QID) | RECTAL | Status: DC | PRN

## 2018-07-02 MED ORDER — SODIUM CHLORIDE 0.9 % IV SOLN
2.0000 g | Freq: Three times a day (TID) | INTRAVENOUS | Status: DC
  Administered 2018-07-02 – 2018-07-04 (×5): 2 g via INTRAVENOUS
  Filled 2018-07-02 (×5): qty 2

## 2018-07-02 MED ORDER — PANTOPRAZOLE SODIUM 40 MG PO TBEC
40.0000 mg | DELAYED_RELEASE_TABLET | Freq: Every day | ORAL | Status: DC
  Administered 2018-07-03 – 2018-07-08 (×6): 40 mg via ORAL
  Filled 2018-07-02 (×6): qty 1

## 2018-07-02 MED ORDER — DILTIAZEM HCL 30 MG PO TABS
30.0000 mg | ORAL_TABLET | Freq: Three times a day (TID) | ORAL | Status: DC
  Administered 2018-07-03 – 2018-07-08 (×16): 30 mg via ORAL
  Filled 2018-07-02 (×16): qty 1

## 2018-07-02 MED ORDER — VANCOMYCIN HCL 1.25 G IV SOLR
1250.0000 mg | Freq: Once | INTRAVENOUS | Status: AC
  Administered 2018-07-02: 1250 mg via INTRAVENOUS
  Filled 2018-07-02: qty 1250

## 2018-07-02 NOTE — ED Notes (Signed)
Patient difficult stick. Patient has 1 IV and 1 set of blood cultures at this time. Unable to obtain 2nd IV access after x3 attempts by 2 different nurses. EDP aware. Antibiotics to be started at this time.

## 2018-07-02 NOTE — ED Notes (Addendum)
ED TO INPATIENT HANDOFF REPORT  ED Nurse Name and Phone #:  Domenica Reamer RN [phone]  S Name/Age/Gender [name] 85 y.o. male Room/Bed: APA07/APA07  Code Status   Code Status: Prior  Home/SNF/Other Home Patient oriented to: self Is this baseline? Yes   Triage Complete: Triage complete  Chief Complaint .  Triage Note EMS  Repots pt c/o cough, fever, sore throat, nausea and ams today.  Reports increased confusion since last night.  Reports positive for UTI.  Daughter reports urine sample was taken Wednesday and results were called today.  Pt not on antibiotics yet.   Daughter also reports pt stopped coumadin and started eloquis 2 weeks ago.    Allergies Allergies  Allergen Reactions  . Tape Other (See Comments)    SKIN IS VERY THIN AND WILL TEAR AND BRUISE VERY EASILY!!!!  . Augmentin [Amoxicillin-Pot Clavulanate] Rash and Other (See Comments)    Redness of skin    Level of Care/Admitting Diagnosis ED Disposition    ED Disposition Condition [facility] Area: [facility] [[id number]]  Level of Care: Med-Surg [16]  Diagnosis: Sepsis secondary to UTI [facility]) [[id number]]  Admitting Physician: Louellen Molder [phone]  Attending Physician: Louellen Molder [phone]  Estimated length of stay: past midnight tomorrow  Certification:: I certify this patient will need inpatient services for at least 2 midnights  PT Class (Do Not Modify): Inpatient [101]  PT Acc Code (Do Not Modify): Private [1]       B Medical/Surgery History Past Medical History:  Diagnosis Date  . Acute bronchitis   . Arthritis   . Atrial fibrillation ([place])    Dr. Harl Bowie- [facility] follows saw 11'14  . Bladder stones    tx. with oral meds and antibiotics, now surgery planned  . Cataracts, both eyes    surgery planned May 2015  . Diabetes mellitus without complication ([place])    Type II  . Dyspnea    with activity  . Dysrhythmia    Afib  . Family history of breast cancer    . Family history of colon cancer   . GERD (gastroesophageal reflux disease)   . 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 Margot Chimes, thus far without recurrence.   . Hypertension   . NHL (non-Hodgkin's lymphoma) ([place]) 2005   Diffuse large B-cell lymphoma, clinically stage IIIA, CD20 positive, status post cervical lymph node biopsy 08/03/2003 on the left. PET scan was also positive in the spleen and small bowel region, but bone marrow aspiration and biopsy were negative. So he essentially had stage IIIAs. He received R-CHOP x6 cycles with CR established by PET scan criteria on 11/23/2003 with no evidence for relapse th  . PAD (peripheral artery disease) ([place]) 08/27/2017  . Skin cancer    Basal cell- face,head, neck,  arms, legs, Back   Past Surgical History:  Procedure Laterality Date  . AMPUTATION Left 06/29/2017   Procedure: AMPUTATION LEFT GREAT TOE;  Surgeon: [name], MD;  Location: [facility];  Service: Vascular;  Laterality: Left;  . AMPUTATION Left 08/27/2017   Procedure: AMPUTATION Left SECOND TOE;  Surgeon: [name], MD;  Location: [facility];  Service: Vascular;  Laterality: Left;  . CATARACT EXTRACTION Bilateral 2015  . CHOLECYSTECTOMY    .  COLON RESECTION     small bowel  . CYSTOSCOPY WITH LITHOLAPAXY N/A 08/10/2013   Procedure: CYSTOSCOPY WITH LITHOLAPAXY WITH [name];  Surgeon: [name], MD;  Location: [facility];  Service: Urology;  Laterality: N/A;  . ESOPHAGEAL DILATION N/A 02/27/2016   Procedure: ESOPHAGEAL DILATION;  Surgeon: [name], MD;  [facility];  Service: Endoscopy;  Laterality: N/A;  . ESOPHAGOGASTRODUODENOSCOPY N/A 02/27/2016   Procedure: ESOPHAGOGASTRODUODENOSCOPY (EGD);  Surgeon: [name],  MD;  [facility];  Service: Endoscopy;  Laterality: N/A;  12:00  . INCISION AND DRAINAGE PERIRECTAL ABSCESS    . LOWER EXTREMITY ANGIOGRAPHY N/A 06/25/2017   Procedure: LOWER EXTREMITY ANGIOGRAPHY;  Surgeon: [name], MD;  Location: [facility];  Service: Cardiovascular;  Laterality: N/A;  . LYMPH NODE DISSECTION Left    '05-neck  . PORT-A-CATH REMOVAL    . PORTACATH PLACEMENT     insertion and removal -last chemotherapy 10 yrs ago  . TRANSURETHRAL RESECTION OF PROSTATE N/A 08/10/2013   Procedure: TRANSURETHRAL RESECTION OF THE PROSTATE WITH GYRUS INSTRUMENTS;  Surgeon: [name], MD;  Location[facility];  Service: Urology;  Laterality: N/A;     A IV Location/Drains/Wounds Patient Lines/Drains/Airways Status   Active Line/Drains/Airways    Name:   Placement date:   Placement time:   Site:   Days:   Peripheral IV 07/02/18 Left Antecubital   07/02/18    1123    Antecubital   less than 1   External Urinary Catheter   07/02/18    1336    -   less than 1   Incision (Closed) 06/29/17 Foot Left   06/29/17    0730     368   Incision (Closed) 08/27/17 Foot Left   08/27/17    1235     309   Pressure Injury 04/16/18 Stage I -  Intact skin with non-blanchable redness of a localized area usually over a bony prominence.   04/16/18    2130     77          Intake/Output Last 24 hours  Intake/Output Summary (Last 24 hours) at 07/02/2018 1510 Last data filed at 07/02/2018 1429 Gross per 24 hour  Intake 1250 ml  Output -  Net 1250 ml    Labs/Imaging Results for orders placed or performed during the hospital encounter of 07/02/18 (from the past 48 hour(s))  Influenza panel by PCR (type A & B)     Status: None   Collection Time: 07/02/18 11:40 AM  Result Value Ref Range   Influenza A By PCR NEGATIVE NEGATIVE   Influenza B By PCR NEGATIVE NEGATIVE    Comment: (NOTE) The Xpert Xpress Flu assay is intended as an aid in the diagnosis of  influenza and should not be  used as a sole basis for treatment.  This  assay is FDA approved for nasopharyngeal swab specimens only. Nasal  washings and aspirates are unacceptable for Xpert Xpress Flu testing. Performed at [facility], [address] [place], [place] [address]   CBC     Status: Abnormal   Collection Time: 07/02/18 11:59 AM  Result Value Ref Range   WBC 13.1 (H) 4.0 - 10.5 K/uL   RBC 4.46 4.22 - 5.81 MIL/uL   Hemoglobin 14.1 13.0 - 17.0 g/dL   HCT 40.9 39.0 - 52.0 %   MCV 91.7 80.0 - 100.0 fL   MCH 31.6 26.0 - 34.0 pg   MCHC 34.5  30.0 - 36.0 g/dL   RDW 13.2 11.5 - 15.5 %   Platelets 155 150 - 400 K/uL   nRBC 0.0 0.0 - 0.2 %    Comment: Performed at [facility], [address] [place], [place] [address]  Basic metabolic panel     Status: Abnormal   Collection Time: 07/02/18 11:59 AM  Result Value Ref Range   Sodium 139 135 - 145 mmol/L   Potassium 3.5 3.5 - 5.1 mmol/L   Chloride 102 98 - 111 mmol/L   CO2 28 22 - 32 mmol/L   Glucose, Bld 141 (H) 70 - 99 mg/dL   BUN 19 8 - 23 mg/dL   Creatinine, Ser 0.87 0.61 - 1.24 mg/dL   Calcium 10.0 8.9 - 10.3 mg/dL   GFR calc non Af Amer >60 >60 mL/min   GFR calc Af Amer >60 >60 mL/min   Anion gap 9 5 - 15    Comment: Performed at [facility], [address], [place], [place] [address]  Protime-INR     Status: None   Collection Time: 07/02/18 11:59 AM  Result Value Ref Range   Prothrombin Time 14.9 11.4 - 15.2 seconds   INR 1.2 0.8 - 1.2    Comment: (NOTE) INR goal varies based on device and disease states. Performed at [facility], [address] [place], [place] [address]   Lactic acid, plasma     Status: Abnormal   Collection Time: 07/02/18 11:59 AM  Result Value Ref Range   Lactic Acid, Venous 2.9 (HH) 0.5 - 1.9 mmol/L    Comment: CRITICAL RESULT CALLED TO, READ BACK BY AND VERIFIED WITH: TUTTLE,A AT 1250 ON 07/02/2018 BY MOSLEY,J Performed at [facility], [address], [place],  [address]    Dg Chest 2 View  Result Date:  07/02/2018 CLINICAL DATA:  Cough.  Fever. EXAM: CHEST - 2 VIEW COMPARISON:  April 16, 2018 FINDINGS: New infiltrates seen in the right base. No other definitive infiltrates are noted. No pneumothorax. The cardiomediastinal silhouette is stable. IMPRESSION: New right basilar and middle lobe infiltrate consistent with pneumonia given history. Recommend treatment with short-term follow-up to ensure resolution. Electronically Signed   By: Dorise Bullion III M.D   On: 07/02/2018 13:13    Pending Labs Unresulted Labs (From admission, onward)    Start     Ordered   07/02/18 1457  Lactic acid, plasma  Once,   STAT     07/02/18 1456   07/02/18 1210  Urine culture  ONCE - STAT,   STAT     07/02/18 1209   07/02/18 1146  Blood culture (routine x 2)  BLOOD CULTURE X 2,   STAT     07/02/18 1146   07/02/18 1143  Urinalysis, Routine w reflex microscopic  ONCE - STAT,   R     07/02/18 1146          Vitals/Pain Today's Vitals   07/02/18 1359 07/02/18 1401 07/02/18 1430 07/02/18 1509  BP: 117/78  (!) 148/89 111/67  Pulse: 94   98  Resp: 20  (!) 26 (!) 24  Temp: 98.3 F (36.8 C)   98.1 F (36.7 C)  TempSrc: Oral   Oral  SpO2: 96%   96%  Weight:      PainSc:  0-No pain      Isolation Precautions Droplet precaution  Medications Medications  acetaminophen (TYLENOL) tablet 1,000 mg (1,000 mg Oral Given 07/02/18 1221)  sodium chloride 0.9 % bolus 1,000  mL (0 mLs Intravenous Stopped 07/02/18 1429)  Vancomycin (VANCOCIN) 1,250 mg in sodium chloride 0.9 % 250 mL IVPB (0 mg Intravenous Stopped 07/02/18 1429)  sodium chloride 0.9 % bolus 1,000 mL (1,000 mLs Intravenous New Bag/Given 07/02/18 1430)    Mobility walks with assist High fall risk   Focused Assessments Pulmonary Assessment Handoff:  Lung sounds: L Breath Sounds: Diminished R Breath Sounds: Diminished O2 Device: Nasal Cannula O2 Flow Rate (L/min): 2 L/min      R Recommendations: See Admitting Provider Note  Report given  to: Silas Flood RN   Additional Notes: Patient is HIGH FALL RISK

## 2018-07-02 NOTE — H&P (Signed)
TRH H&P   Patient Demographics:    [name], is a 85 y.o. male  MRN: [id number]   DOB - 12/09/1923  Admit Date - 07/02/2018  Outpatient Primary MD for the patient is Rosita Fire, MD  Referring MD: Dr Sedonia Small  Outpatient Specialists: oncology  Patient coming from: Home  History primarily provided by patient's daughter at bedside since patient seems confused.   Chief Complaint  Patient presents with  . Cough  . Fever      HPI:    [name]  is a 85 y.o. male, with history of metastatic breast cancer, A. fib on Eliquis, type 2 diabetes mellitus, GERD, non-Hodgkin's lymphoma stage [place] cycles of R-CHOP without relapse, peripheral vascular disease, hypertension, chronic diarrhea with prior to hospitalization in the past 6 months with pneumonia who was brought from home by his daughter with chills and increasing cough since this morning.  Daughter reports that patient had increased frequency of urination 5 days back for which the home health sent a UA.  She received a call from the home health yesterday and was notified that his urine culture grew 10 K-15 K enterococcus faecalis sensitive to ampicillin, nitrofurantoin and vancomycin but patient had not been started on antibiotic yet.  This morning she found him to be increasingly coughing and short of breath.  Was also warm and was shaking.  Patient lives with his wife who does not have any respiratory symptoms, no sick contact or recent travel. Denies any headache, dizziness, blurred vision, chest pain, palpitations, nausea, vomiting, abdominal pain, diarrhea, increased weakness, numbness or fall.  Daughter reports that patient also has been more confused and normal since this morning.  (has mild to moderate dementia at baseline).  In the ED code sepsis was initiated as patient was mildly tachycardic and tachypneic, WBC of 13 K  and elevated lactic acid of 2.9.  Flu PCR was negative. Urine culture and blood culture sent.  Given 2 L normal saline bolus and empiric IV vancomycin for enterococcus.  Chest x-ray done showed new right lower lobe infiltrate.   Review of systems:    In addition to the HPI above,  Subjective fever and chills No Headache, No changes with Vision or hearing, No problems swallowing food or Liquids, No Chest pain, Cough and shortness of Breath, No Abdominal pain, No Nausea or vomiting, Bowel movements are regular, No Blood in stool or Urine, Increased urinary frequency and dysuria No new skin rashes or bruises, No new joints pains-aches,  No new weakness, tingling, numbness in any extremity, No recent weight gain or loss, No polyuria, polydypsia or polyphagia, No significant Mental Stressors.    With Past History of the following :    Past Medical History:  Diagnosis Date  . Acute bronchitis   . Arthritis   . Atrial fibrillation ([place])    Dr. Harl Bowie- [facility] follows  saw 11'14  . Bladder stones    tx. with oral meds and antibiotics, now surgery planned  . Cataracts, both eyes    surgery planned May 2015  . Diabetes mellitus without complication ([place])    Type II  . Dyspnea    with activity  . Dysrhythmia    Afib  . Family history of breast cancer   . Family history of colon cancer   . GERD (gastroesophageal reflux disease)   . 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 Margot Chimes, thus far without recurrence.   . Hypertension   . NHL (non-Hodgkin's lymphoma) ([place]) 2005   Diffuse large B-cell lymphoma, clinically stage IIIA, CD20 positive, status post cervical lymph node biopsy 08/03/2003 on the left. PET scan was also positive in the spleen and  small bowel region, but bone marrow aspiration and biopsy were negative. So he essentially had stage IIIAs. He received R-CHOP x6 cycles with CR established by PET scan criteria on 11/23/2003 with no evidence for relapse th  . PAD (peripheral artery disease) ([place]) 08/27/2017  . Skin cancer    Basal cell- face,head, neck,  arms, legs, Back      Past Surgical History:  Procedure Laterality Date  . AMPUTATION Left 06/29/2017   Procedure: AMPUTATION LEFT GREAT TOE;  Surgeon: [name], MD;  Location: [facility];  Service: Vascular;  Laterality: Left;  . AMPUTATION Left 08/27/2017   Procedure: AMPUTATION Left SECOND TOE;  Surgeon: [name], MD;  Location: [facility];  Service: Vascular;  Laterality: Left;  . CATARACT EXTRACTION Bilateral 2015  . CHOLECYSTECTOMY    . COLON RESECTION     small bowel  . CYSTOSCOPY WITH LITHOLAPAXY N/A 08/10/2013   Procedure: CYSTOSCOPY WITH LITHOLAPAXY WITH [name];  Surgeon: [name], MD;  Location: [facility];  Service: Urology;  Laterality: N/A;  . ESOPHAGEAL DILATION N/A 02/27/2016   Procedure: ESOPHAGEAL DILATION;  Surgeon: [name], MD;  [facility];  Service: Endoscopy;  Laterality: N/A;  . ESOPHAGOGASTRODUODENOSCOPY N/A 02/27/2016   Procedure: ESOPHAGOGASTRODUODENOSCOPY (EGD);  Surgeon: [name], MD;  [facility];  Service: Endoscopy;  Laterality: N/A;  12:00  . INCISION AND DRAINAGE PERIRECTAL ABSCESS    . LOWER EXTREMITY ANGIOGRAPHY N/A 06/25/2017   Procedure: LOWER EXTREMITY ANGIOGRAPHY;  Surgeon: [name], MD;  Location: [facility];  Service: Cardiovascular;  Laterality: N/A;  . LYMPH NODE DISSECTION Left    '05-neck  . PORT-A-CATH REMOVAL    . PORTACATH PLACEMENT     insertion and removal -last chemotherapy 10 yrs ago  . TRANSURETHRAL RESECTION OF PROSTATE N/A 08/10/2013   Procedure: TRANSURETHRAL RESECTION OF THE PROSTATE WITH GYRUS INSTRUMENTS;  Surgeon: [name], MD;   Location: [facility];  Service: Urology;  Laterality: N/A;      Social History:     Social History   Tobacco Use  . Smoking status: Never Smoker  . Smokeless tobacco: Never Used  Substance Use Topics  . Alcohol use: No     Lives -home with wife Mobility -independent.  Has home health   Family History :     Family History  Problem Relation Age of Onset  . Heart attack Mother        died in  her 103s  . Other Father 78       pneumonia - died  . Breast cancer Sister        dx in her 89s-60s  . Colon cancer Brother        dx in his 50s  . Cancer Sister        TAH/BSO due to "male cancer"  . Breast cancer Daughter 34  . Breast cancer Daughter 65       DCIS - ATM VUS on [employer] 83 gene panel in 2018  . Breast cancer Daughter 3       DCIS - Negative on [employer] MyRisk 25 gene apnel in 2015  . Thyroid cancer Daughter 52       Medullary and Papillary  . Thyroid nodules Grandchild       Home Medications:   Prior to Admission medications   Medication Sig Start Date End Date Taking? Authorizing Provider  abiraterone acetate (ZYTIGA) 250 MG tablet Take 2 tablets (500 mg total) by mouth daily. Take on an empty stomach 1 hour before or 2 hours after a meal 04/19/18  Yes [name], MD  acetaminophen (TYLENOL) 500 MG tablet Take 500-1,000 mg by mouth every 8 (eight) hours as needed for mild pain, fever or headache.    Yes [provider]  apixaban (ELIQUIS) 5 MG TABS tablet Take 5 mg by mouth 2 (two) times daily.   Yes [provider]  Calcium Carb-Cholecalciferol (CALCIUM 600+D) 600-800 MG-UNIT TABS Take 1 tablet by mouth daily.    Yes [provider]  Calcium Polycarbophil (FIBER-CAPS PO) Take 1 capsule by mouth 2 (two) times daily.   Yes [provider]  Cholecalciferol (VITAMIN D3) 1000 units CAPS Take 1,000 Units by mouth every morning.    Yes [provider]  Cranberry-Vitamin C-Vitamin E (CRANBERRY PLUS VITAMIN C)  4200-20-3 MG-MG-UNIT CAPS Take 1 capsule by mouth every morning.   Yes [provider]  Cyanocobalamin (VITAMIN B-12 IJ) Inject 1,000 mcg as directed every 30 (thirty) days.    Yes [provider]  diltiazem (CARDIZEM) 90 MG tablet TAKE 1 TABLET BY MOUTH TWICE DAILY -- **NEEDS APPOINTMENT FOR FURTHER REFILLS** 05/18/18  Yes Branch, Alphonse Guild, MD  diphenhydrAMINE-APAP, sleep, (TYLENOL PM EXTRA STRENGTH PO) Take 1 tablet by mouth at bedtime.    Yes [provider]  donepezil (ARICEPT) 10 MG tablet Take 10 mg by mouth daily.   Yes [provider]  feeding supplement, ENSURE ENLIVE, (ENSURE ENLIVE) LIQD Take 237 mLs by mouth daily. CHOCOLATE    Yes [provider]  furosemide (LASIX) 20 MG tablet Take 1 tablet (20 mg total) by mouth daily. 05/18/18 05/18/19 Yes BranchAlphonse Guild, MD  glipiZIDE (GLUCOTROL XL) 5 MG 24 hr tablet Take 5 mg by mouth daily with breakfast.  06/25/15  Yes [provider]  guaiFENesin (MUCINEX) 600 MG 12 hr tablet Take 600 mg by mouth 2 (two) times daily as needed for to loosen phlegm.   Yes [provider]  Leuprolide Acetate, 6 Month, (LUPRON DEPOT) 45 MG injection Inject 45 mg into the muscle every 6 (six) months.   Yes [provider]  lisinopril (PRINIVIL,ZESTRIL) 20 MG tablet Take 1 tablet (20 mg total) by mouth daily. 05/18/18  Yes BranchAlphonse Guild, MD  loperamide (IMODIUM) 2 MG capsule Take 1 capsule (2 mg total) by mouth as needed for diarrhea or loose stools. 04/18/18  Yes Oretha Milch D, MD  metFORMIN (GLUCOPHAGE) 850  MG tablet Take 850 mg by mouth 2 (two) times daily with a meal.  03/23/17  Yes [provider]  mirtazapine (REMERON) 7.5 MG tablet Take 7.5 mg by mouth at bedtime.   Yes [provider]  Multiple Vitamin (MULTIVITAMIN WITH MINERALS) TABS tablet Take 1 tablet by mouth every morning.   Yes [provider]  nystatin (MYCOSTATIN) powder Apply 1 g topically 4  (four) times daily as needed (fungus).    Yes [provider]  omeprazole (PRILOSEC) 40 MG capsule Take 40 mg by mouth every other day.  12/14/17  Yes [provider]  potassium chloride (K-DUR,KLOR-CON) 10 MEQ tablet Take 2 tablets (20 mEq total) by mouth daily. Patient taking differently: Take 10 mEq by mouth 2 (two) times daily.  06/21/17  Yes Higgs, Mathis Dad, MD  predniSONE (DELTASONE) 5 MG tablet Take 0.5 tablets (2.5 mg total) by mouth 2 (two) times daily. 05/09/18  Yes Lockamy, Randi L, NP-C  denosumab (XGEVA) 120 MG/1.7ML SOLN injection Inject 120 mg into the skin every 30 (thirty) days.     [provider]  fluticasone (FLONASE) 50 MCG/ACT nasal spray Place 2 sprays into both nostrils daily. 06/08/18   [provider]  warfarin (COUMADIN) 2.5 MG tablet TAKE 2 TO 3 TABLETS BY MOUTH ONCE DAILY AS DIRECTED BY  THE  ANTICOAGULATION  CLINIC Patient not taking: Reported on 07/02/2018 06/02/18   Arnoldo Lenis, MD     Allergies:     Allergies  Allergen Reactions  . Tape Other (See Comments)    SKIN IS VERY THIN AND WILL TEAR AND BRUISE VERY EASILY!!!!  . Augmentin [Amoxicillin-Pot Clavulanate] Rash and Other (See Comments)    Redness of skin     Physical Exam:   Vitals  Blood pressure 111/67, pulse 98, temperature 98.1 F (36.7 C), temperature source Oral, resp. rate (!) 24, weight 70.3 kg, SpO2 96 %.    General: Elderly male lying in bed appears fatigued, in no acute distress HEENT: Pupils reactive bilaterally, EOMI, no pallor, no icterus, moist mucosa, supple neck, no cervical lymphadenopathy Chest: Coarse crackles over right lung base, no rhonchi or wheeze CVS: S1 and S2 irregular, no murmurs rub or gallop GI: Soft, nondistended, mild suprapubic tenderness, bowel sounds present Musculoskeletal: Warm, no edema CNS: Alert and oriented x1-2, nonfocal   Data Review:    CBC Recent Labs  Lab 07/02/18 1159  WBC 13.1*  HGB 14.1  HCT 40.9  PLT  155  MCV 91.7  MCH 31.6  MCHC 34.5  RDW 13.2   ------------------------------------------------------------------------------------------------------------------  Chemistries  Recent Labs  Lab 07/02/18 1159  NA 139  K 3.5  CL 102  CO2 28  GLUCOSE 141*  BUN 19  CREATININE 0.87  CALCIUM 10.0   ------------------------------------------------------------------------------------------------------------------ estimated creatinine clearance is 53.9 mL/min (by C-G formula based on SCr of 0.87 mg/dL). ------------------------------------------------------------------------------------------------------------------ No results for input(s): TSH, T4TOTAL, T3FREE, THYROIDAB in the last 72 hours.  Invalid input(s): FREET3  Coagulation profile Recent Labs  Lab 07/02/18 1159  INR 1.2   ------------------------------------------------------------------------------------------------------------------- No results for input(s): DDIMER in the last 72 hours. -------------------------------------------------------------------------------------------------------------------  Cardiac Enzymes No results for input(s): CKMB, TROPONINI, MYOGLOBIN in the last 168 hours.  Invalid input(s): CK ------------------------------------------------------------------------------------------------------------------    Component Value Date/Time   BNP 132.0 (H) 03/16/2017 1925     ---------------------------------------------------------------------------------------------------------------  Urinalysis    Component Value Date/Time   COLORURINE YELLOW 07/02/2018 1451   APPEARANCEUR CLEAR 07/02/2018 1451   LABSPEC 1.015 07/02/2018 1451  PHURINE 5.0 07/02/2018 1451   GLUCOSEU NEGATIVE 07/02/2018 1451   HGBUR MODERATE (A) 07/02/2018 1451   BILIRUBINUR NEGATIVE 07/02/2018 1451   KETONESUR NEGATIVE 07/02/2018 1451   PROTEINUR NEGATIVE 07/02/2018 1451   NITRITE NEGATIVE 07/02/2018 1451   LEUKOCYTESUR  NEGATIVE 07/02/2018 1451    ----------------------------------------------------------------------------------------------------------------   Imaging Results:    Dg Chest 2 View  Result Date: 07/02/2018 CLINICAL DATA:  Cough.  Fever. EXAM: CHEST - 2 VIEW COMPARISON:  April 16, 2018 FINDINGS: New infiltrates seen in the right base. No other definitive infiltrates are noted. No pneumothorax. The cardiomediastinal silhouette is stable. IMPRESSION: New right basilar and middle lobe infiltrate consistent with pneumonia given history. Recommend treatment with short-term follow-up to ensure resolution. Electronically Signed   By: Dorise Bullion III M.D   On: 07/02/2018 13:13    My personal review of EKG: A. fib at 108 with RBBB and left posterior fascicular block, no new changes except for elevated rate.   Assessment & Plan:    Principal Problem:   Sepsis ([place]) secondary to UTI and healthcare associated pneumonia Early sepsis symptoms based on mild tachycardia, tachypnea, mild leukocytosis and elevated lactic acid.  Code sepsis initiated in the ED and received 2 L fluid bolus. Remaining plan as outlined below.   Active Problems: UTI secondary to Enterococcus faecalis Culture sent on 3/9 growing 10-50 K organism.  Was not started on antibiotic yet.  Empiric vancomycin.    HCAP (healthcare-associated pneumonia) Recurrent pneumonia in past 6 months (third episode) with aspiration risk.  Empiric IV vancomycin and cefepime.  Follow blood culture, urine culture, urine strep and Legionella antigen. Continue supplemental oxygen.  Added antitussive. Daughter reports that patient has been followed by swollen nurse at home.  Placed on dysphagia level 1 diet and consult SLP.  Dementia with acute delirium Continue Aricept.  Has had sundowning during previous hospital stay.  Monitor closely. Secondary to pneumonia and UTI with sepsis.  Avoid benzos and narcotics.  Monitor for now.  Paroxysmal A.  fib Currently rate controlled.  Resume Eliquis.     NHL (non-Hodgkin's lymphoma) (HCC)   GIST (gastrointestinal stromal tumor), malignant ([place])   Prostate cancer ([place]) Follows with Dr. Delton Coombes.  Has bony metastasis.  Continue outpatient prednisone.    Type 2 diabetes mellitus (HCC) CBG stable.  Hold oral hypoglycemic and monitor on sliding scale coverage.    DVT Prophylaxis: On Eliquis AM Labs Ordered, also please review Full Orders  Family Communication: Admission, patients condition and plan of care including tests being ordered have been discussed with the patient and his daughter at bedside  Code Status DNR  Likely DC to home once improved  Condition: [place] called: None  Admission status: Inpatient Patient presenting with sepsis in the setting of UTI and healthcare associated pneumonia with delirium requiring at least >2 midnight in the hospital to be monitored for IV antibiotics, follow cultures, swallow evaluation and IV hydration.  Time spent in minutes : 70     M.D on 07/02/2018 at 3:30 PM  Between 7am to 7pm - Pager - [phone]. After 7pm go to [email or web address] - password [facility]  [facility] - Office  [phone]

## 2018-07-02 NOTE — ED Notes (Signed)
Pt handed urinal for UA

## 2018-07-02 NOTE — Progress Notes (Addendum)
Pharmacy Antibiotic Note  [name] is a 85 y.o. male admitted on 07/02/2018 with (HCAP) pneumonia.  Pharmacy has been consulted for Vancomycin and Cefepime dosing.  Plan: Vancomycin 1250mg  IV loading dose then 750mg  IV q12h Cefepime 2gm IV q8h F/U cxs and clinical progress  Monitor V/S, labs and levels as indicated  Weight: 155 lb (70.3 kg)  Temp (24hrs), Avg:98.5 F (36.9 C), Min:98.1 F (36.7 C), Max:99.3 F (37.4 C)  Recent Labs  Lab 07/02/18 1159 07/02/18 1510  WBC 13.1*  --   CREATININE 0.87  --   LATICACIDVEN 2.9* 2.7*    Estimated Creatinine Clearance: 53.9 mL/min (by C-G formula based on SCr of 0.87 mg/dL).    Allergies  Allergen Reactions  . Tape Other (See Comments)    SKIN IS VERY THIN AND WILL TEAR AND BRUISE VERY EASILY!!!!  . Augmentin [Amoxicillin-Pot Clavulanate] Rash and Other (See Comments)    Redness of skin    Antimicrobials this admission: Cefepime 3/14 >>  Vancomycin 3/14>>  Dose adjustments this admission: n/a  Microbiology results: 3/14 BCx: pending 3/14 UCx: pending  3/14 Influenza: negative  MRSA PCR:   Thank you for allowing pharmacy to be a part of this patient's care.  Isac Sarna, BS [place], [place] Clinical Pharmacist Pager [phone] 07/02/2018 4:19 PM

## 2018-07-02 NOTE — ED Triage Notes (Addendum)
EMS  Repots pt c/o cough, fever, sore throat, nausea and ams today.  Reports increased confusion since last night.  Reports positive for UTI.  Daughter reports urine sample was taken Wednesday and results were called today.  Pt not on antibiotics yet.   Daughter also reports pt stopped coumadin and started eloquis 2 weeks ago.

## 2018-07-02 NOTE — ED Notes (Signed)
Critical Call:  Lactic Acid 2.9    DrB informed

## 2018-07-02 NOTE — Progress Notes (Signed)
CRITICAL VALUE ALERT  Critical Value:  Lactic acid 2.4 Date & Time Notied:  07/03/2018 1815 Provider Notified: [id number]  Orders Received/Actions taken: none at this time

## 2018-07-02 NOTE — ED Provider Notes (Signed)
[facility] Emergency Department Provider Note MRN:  [id number]  Arrival date & time: 07/02/18     Chief Complaint   Cough and Fever   History of Present Illness   [name] is a 85 y.o. year-old male with a history of dementia, diabetes, A. fib, PAD presenting to the ED with chief complaint of cough and fever.  Patient went to bed last night acting and feeling his normal self.  This morning with subjective fevers per daughter, cough, complaining of sore throat.  Denies chest pain or shortness of breath, no abdominal pain.  Daughter also explains that the lab called and he had a urine tested/cultured that grew Enterococcus faecalis.  Sent here for management.  I was unable to obtain an accurate HPI, PMH, or ROS due to the patient's altered mental status.  Review of Systems  A complete 10 system review of systems was obtained and all systems are negative except as noted in the HPI and PMH.   Patient's Health History    Past Medical History:  Diagnosis Date  . Acute bronchitis   . Arthritis   . Atrial fibrillation ([place])    Dr. Harl Bowie- [place] follows saw 11'14  . Bladder stones    tx. with oral meds and antibiotics, now surgery planned  . Cataracts, both eyes    surgery planned May 2015  . Diabetes mellitus without complication ([place])    Type II  . Dyspnea    with activity  . Dysrhythmia    Afib  . Family history of breast cancer   . Family history of colon cancer   . GERD (gastroesophageal reflux disease)   . 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 Margot Chimes, thus far without recurrence.   . Hypertension   . NHL (non-Hodgkin's lymphoma) ([place]) 2005   Diffuse large B-cell lymphoma, clinically stage IIIA, CD20 positive,  status post cervical lymph node biopsy 08/03/2003 on the left. PET scan was also positive in the spleen and small bowel region, but bone marrow aspiration and biopsy were negative. So he essentially had stage IIIAs. He received R-CHOP x6 cycles with CR established by PET scan criteria on 11/23/2003 with no evidence for relapse th  . PAD (peripheral artery disease) ([place]) 08/27/2017  . Skin cancer    Basal cell- face,head, neck,  arms, legs, Back    Past Surgical History:  Procedure Laterality Date  . AMPUTATION Left 06/29/2017   Procedure: AMPUTATION LEFT GREAT TOE;  Surgeon: [name], MD;  Location: [facility];  Service: Vascular;  Laterality: Left;  . AMPUTATION Left 08/27/2017   Procedure: AMPUTATION Left SECOND TOE;  Surgeon: [name], MD;  Location: [facility];  Service: Vascular;  Laterality: Left;  . CATARACT EXTRACTION Bilateral 2015  . CHOLECYSTECTOMY    . COLON RESECTION     small bowel  . CYSTOSCOPY WITH LITHOLAPAXY N/A 08/10/2013   Procedure: CYSTOSCOPY WITH LITHOLAPAXY WITH [name];  Surgeon: [name], MD;  [facility];  Service: Urology;  Laterality: N/A;  . ESOPHAGEAL DILATION N/A 02/27/2016   Procedure: ESOPHAGEAL DILATION;  Surgeon: [name], MD;  [facility];  Service: Endoscopy;  Laterality: N/A;  . ESOPHAGOGASTRODUODENOSCOPY N/A 02/27/2016   Procedure: ESOPHAGOGASTRODUODENOSCOPY (EGD);  Surgeon: [name],  MD;  [facility];  Service: Endoscopy;  Laterality: N/A;  12:00  . INCISION AND DRAINAGE PERIRECTAL ABSCESS    . LOWER EXTREMITY ANGIOGRAPHY N/A 06/25/2017   Procedure: LOWER EXTREMITY ANGIOGRAPHY;  Surgeon: [name], MD;  Location: [facility];  Service: Cardiovascular;  Laterality: N/A;  . LYMPH NODE DISSECTION Left    '05-neck  . PORT-A-CATH REMOVAL    . PORTACATH PLACEMENT     insertion and removal -last chemotherapy 10 yrs ago  . TRANSURETHRAL RESECTION OF PROSTATE N/A 08/10/2013    Procedure: TRANSURETHRAL RESECTION OF THE PROSTATE WITH GYRUS INSTRUMENTS;  Surgeon: [name], MD;  Location[facility];  Service: Urology;  Laterality: N/A;    Family History  Problem Relation Age of Onset  . Heart attack Mother        died in her 19s  . Other Father 17       pneumonia - died  . Breast cancer Sister        dx in her 87s-60s  . Colon cancer Brother        dx in his 65s  . Cancer Sister        TAH/BSO due to "male cancer"  . Breast cancer Daughter 40  . Breast cancer Daughter 54       DCIS - ATM VUS on [employer] 83 gene panel in 2018  . Breast cancer Daughter 73       DCIS - Negative on [employer] MyRisk 25 gene apnel in 2015  . Thyroid cancer Daughter 36       Medullary and Papillary  . Thyroid nodules Grandchild     Social History   Socioeconomic History  . Marital status: Married    Spouse name: Not on file  . Number of children: Not on file  . Years of education: Not on file  . Highest education level: Not on file  Occupational History  . Occupation: retired    Fish farm manager: RETIRED    Comment: salesman  Social Needs  . Financial resource strain: Not on file  . Food insecurity:    Worry: Not on file    Inability: Not on file  . Transportation needs:    Medical: Not on file    Non-medical: Not on file  Tobacco Use  . Smoking status: Never Smoker  . Smokeless tobacco: Never Used  Substance and Sexual Activity  . Alcohol use: No  . Drug use: No  . Sexual activity: Not Currently  Lifestyle  . Physical activity:    Days per week: Not on file    Minutes per session: Not on file  . Stress: Not on file  Relationships  . Social connections:    Talks on phone: Not on file    Gets together: Not on file    Attends religious service: Not on file    Active member of club or organization: Not on file    Attends meetings of clubs or organizations: Not on file    Relationship status: Not on file  . Intimate partner violence:    Fear of current or ex  partner: Not on file    Emotionally abused: Not on file    Physically abused: Not on file    Forced sexual activity: Not on file  Other Topics Concern  . Not on file  Social History Narrative  . Not on file     Physical Exam  Vital Signs and Nursing Notes reviewed Vitals:  07/02/18 1430 07/02/18 1509  BP: (!) 148/89 111/67  Pulse:  98  Resp: (!) 26 (!) 24  Temp:  98.1 F (36.7 C)  SpO2:  96%    CONSTITUTIONAL: Chronically ill-appearing, NAD NEURO: Somnolent but wakes to voice, oriented to name, moving all extremities. EYES:  eyes equal and reactive ENT/NECK:  no LAD, no JVD CARDIO: Tachycardic rate, well-perfused, normal S1 and S2 PULM:  CTAB no wheezing or rhonchi GI/GU:  normal bowel sounds, non-distended, non-tender MSK/SPINE:  No gross deformities, no edema SKIN:  no rash, atraumatic PSYCH:  Appropriate speech and behavior  Diagnostic and Interventional Summary    EKG Interpretation  Date/Time:  Saturday July 02 2018 11:26:06 EDT Ventricular Rate:  108 PR Interval:    QRS Duration: 137 QT Interval:  366 QTC Calculation: 480 R Axis:   114 Text Interpretation:  Atrial fibrillation RBBB and LPFB Repol abnrm suggests ischemia, diffuse leads Confirmed by Gerlene Fee [phone]) on 07/02/2018 11:47:27 AM      Labs Reviewed  CBC - Abnormal; Notable for the following components:      Result Value   WBC 13.1 (*)    All other components within normal limits  BASIC METABOLIC PANEL - Abnormal; Notable for the following components:   Glucose, Bld 141 (*)    All other components within normal limits  URINALYSIS, ROUTINE W REFLEX MICROSCOPIC - Abnormal; Notable for the following components:   Hgb urine dipstick MODERATE (*)    All other components within normal limits  LACTIC ACID, PLASMA - Abnormal; Notable for the following components:   Lactic Acid, Venous 2.9 (*)    All other components within normal limits  CULTURE, BLOOD (ROUTINE X 2)  URINE CULTURE  INFLUENZA  PANEL BY PCR (TYPE A & B)  PROTIME-INR  LACTIC ACID, PLASMA    DG Chest 2 View  Final Result      Medications  acetaminophen (TYLENOL) tablet 1,000 mg (1,000 mg Oral Given 07/02/18 1221)  sodium chloride 0.9 % bolus 1,000 mL (0 mLs Intravenous Stopped 07/02/18 1429)  Vancomycin (VANCOCIN) 1,250 mg in sodium chloride 0.9 % 250 mL IVPB (0 mg Intravenous Stopped 07/02/18 1429)  sodium chloride 0.9 % bolus 1,000 mL (1,000 mLs Intravenous New Bag/Given 07/02/18 1430)     Procedures Critical Care Critical Care Documentation Critical care time provided by me (excluding procedures): 35 minutes  Condition necessitating critical care: Concern for sepsis  Components of critical care management: reviewing of prior records, laboratory and imaging interpretation, frequent re-examination and reassessment of vital signs, administration of IV fluids, IV antibiotics, discussion with consulting services    ED Course and Medical Decision Making  I have reviewed the triage vital signs and the nursing notes.  Pertinent labs & imaging results that were available during my care of the patient were reviewed by me and considered in my medical decision making (see below for details).  Confirmed UTI growing Enterococcus faecalis, sensitivities brought to the ED by daughter, sensitive to both ampicillin and vancomycin but patient has a allergy to penicillins.  Patient is tachycardic, seemingly altered, and with a known source of infection will code sepsis alert, provide fluids, vancomycin, anticipating admission.  Given the cough will swab for flu and screen for pneumonia with chest x-ray.  Clinical Course as of Jul 01 1525  Sat Jul 02, 2018  1157 Patient is DNR/DNI.   [MB]    Clinical Course User Index [MB] Maudie Flakes, MD     Admitted to hospital  service for further care.  Barth Kirks. Sedonia Small, MD [place] [email or web address]  Final Clinical  Impressions(s) / ED Diagnoses     ICD-10-CM   1. Acute cystitis without hematuria N30.00   2. Cough R05 DG Chest 2 View    DG Chest 2 View    ED Discharge Orders    None         Maudie Flakes, MD 07/02/18 [phone]

## 2018-07-02 NOTE — ED Notes (Addendum)
CRITICAL VALUE ALERT  Critical Value:  Lactic acid  2.7  Date & Time Notied:  07/02/2018 @ 0962  Provider Notified: Dr Clementeen Graham  Orders Received/Actions taken: orders to be given

## 2018-07-03 DIAGNOSIS — E876 Hypokalemia: Secondary | ICD-10-CM

## 2018-07-03 LAB — GLUCOSE, CAPILLARY
Glucose-Capillary: 94 mg/dL (ref 70–99)
Glucose-Capillary: 100 mg/dL — ABNORMAL HIGH (ref 70–99)
Glucose-Capillary: 192 mg/dL — ABNORMAL HIGH (ref 70–99)
Glucose-Capillary: 179 mg/dL — ABNORMAL HIGH (ref 70–99)

## 2018-07-03 LAB — CBC
HCT: 35.2 % — ABNORMAL LOW (ref 39.0–52.0)
Hemoglobin: 12.3 g/dL — ABNORMAL LOW (ref 13.0–17.0)
MCH: 32.2 pg (ref 26.0–34.0)
MCHC: 34.9 g/dL (ref 30.0–36.0)
MCV: 92.1 fL (ref 80.0–100.0)
Platelets: 136 10*3/uL — ABNORMAL LOW (ref 150–400)
RBC: 3.82 MIL/uL — ABNORMAL LOW (ref 4.22–5.81)
RDW: 13.5 % (ref 11.5–15.5)
WBC: 17.3 10*3/uL — ABNORMAL HIGH (ref 4.0–10.5)
nRBC: 0 % (ref 0.0–0.2)

## 2018-07-03 LAB — BASIC METABOLIC PANEL
Anion gap: 9 (ref 5–15)
BUN: 22 mg/dL (ref 8–23)
CO2: 22 mmol/L (ref 22–32)
Calcium: 8.9 mg/dL (ref 8.9–10.3)
Chloride: 108 mmol/L (ref 98–111)
Creatinine, Ser: 0.77 mg/dL (ref 0.61–1.24)
GFR calc Af Amer: >60 mL/min (ref >60)
GFR calc non Af Amer: >60 mL/min (ref >60)
Glucose, Bld: 94 mg/dL (ref 70–99)
Potassium: 3.2 mmol/L — ABNORMAL LOW (ref 3.5–5.1)
Sodium: 139 mmol/L (ref 135–145)

## 2018-07-03 LAB — MRSA PCR SCREENING: MRSA by PCR: NEGATIVE

## 2018-07-03 MED ORDER — POTASSIUM CHLORIDE CRYS ER 20 MEQ PO TBCR
40.0000 meq | EXTENDED_RELEASE_TABLET | Freq: Once | ORAL | Status: AC
  Administered 2018-07-03: 40 meq via ORAL
  Filled 2018-07-03: qty 2

## 2018-07-03 MED ORDER — HALOPERIDOL LACTATE 5 MG/ML IJ SOLN: 0.5000 mg | Freq: Four times a day (QID) | INTRAMUSCULAR | Status: DC | PRN

## 2018-07-03 NOTE — Progress Notes (Signed)
Patient bladder scanned and only showed approximately 100cc of urine in bladder.  Patient has complained of difficulty with urination but has voided during the shift.

## 2018-07-03 NOTE — Progress Notes (Signed)
PROGRESS NOTE                                                                                                                                                                                                             Patient Demographics:    [name], is a 85 y.o. male, DOB - Sep 09, 1923, WGY:[id number]  Admit date - 07/02/2018   Admitting Physician Louellen Molder, MD  Outpatient Primary MD for the patient is Rosita Fire, MD  LOS - 1  Outpatient Specialists:  Chief Complaint  Patient presents with  . Cough  . Fever       Brief Narrative   85 year old male with metastatic prostate cancer, A. fib on Eliquis, type 2 diabetes mellitus, NHL completed cycle of R-CHOP without relapse, peripheral vascular disease, hypertension, chronic diarrhea and GERD with 2 prior hospitalization in the past 6 months with pneumonia and concern for aspiration brought from home with increased urinary frequency for past 5 days and 1 day of cough with chills, congestion and shortness of breath. Patient found to be in early sepsis secondary to UTI and lobar pneumonia.   Subjective:   Was restless all night as per daughter and was sleeping on my evaluation this morning.  Reports breathing to be better today.   Assessment  & Plan :   Sepsis ([place]) secondary to UTI and healthcare associated pneumonia Sepsis pathway initiated in the ED.  Currently resolved, has worsened leukocytosis today which will be monitored.  DC fluids.    Active Problems: UTI secondary to Enterococcus faecalis Culture sent on 3/9 (by home health) for dysuria growing 10-50 K organism, sensitive to vancomycin, nitrofurantoin and ampicillin as per daughter.  Continue empiric vancomycin.  Follow urine culture sent in the ED.    HCAP (healthcare-associated pneumonia) Recurrent pneumonia in past 6 months (third episode) with aspiration risk.  Last hospitalized in  December 2019. Empiric IV vancomycin and cefepime.  Follow blood culture, urine culture, urine strep and Legionella antigen. Continue antitussives. Daughter reports that patient has been followed by  swallow nurse at home.  Placed on dysphagia level 1 diet and consult SLP.  Dementia with acute delirium Secondary to acute illness.  Continue Aricept.  Has had sundowning during previous hospital stay.  Monitor closely.   Avoid benzos and narcotics.    Paroxysmal A. fib Currently rate  controlled.    Continue Cardizem and Eliquis.    NHL (non-Hodgkin's lymphoma) (HCC)   GIST (gastrointestinal stromal tumor), malignant ([place])   Prostate cancer ([place]) Follows with Dr. Delton Coombes.  Has bony metastasis.  Continue outpatient prednisone.    Type 2 diabetes mellitus (HCC) CBG stable.  Holding oral hypoglycemic and monitor on sliding scale coverage.  Hypokalemia Replenished   Code Status : DNR  Family Communication  : Wife and daughters at bedside  Disposition Plan  : Pending clinical improvement, PT evaluation, possibly in the next 48 hours  Barriers For Discharge : Active symptoms  Consults  : None  Procedures  : None  DVT Prophylaxis  : Eliquis  Lab Results  Component Value Date   PLT 136 (L) 07/03/2018    Antibiotics  :    Anti-infectives (From admission, onward)   Start     Dose/Rate Route Frequency Ordered Stop   07/03/18 0100  vancomycin (VANCOCIN) IVPB 750 mg/150 ml premix     750 mg 150 mL/hr over 60 Minutes Intravenous Every 12 hours 07/02/18 1659     07/02/18 1700  ceFEPIme (MAXIPIME) 1 g in sodium chloride 0.9 % 100 mL IVPB  Status:  Discontinued     1 g 200 mL/hr over 30 Minutes Intravenous Every 8 hours 07/02/18 1650 07/02/18 1656   07/02/18 1700  ceFEPIme (MAXIPIME) 2 g in sodium chloride 0.9 % 100 mL IVPB     2 g 200 mL/hr over 30 Minutes Intravenous Every 8 hours 07/02/18 1624     07/02/18 1200  vancomycin (VANCOCIN) 1,250 mg in sodium chloride 0.9 % 250  mL IVPB  Status:  Discontinued     1,250 mg 166.7 mL/hr over 90 Minutes Intravenous  Once 07/02/18 1146 07/02/18 1155   07/02/18 1200  Vancomycin (VANCOCIN) 1,250 mg in sodium chloride 0.9 % 250 mL IVPB     1,250 mg 166.7 mL/hr over 90 Minutes Intravenous  Once 07/02/18 1155 07/02/18 1429        Objective:   Vitals:   07/02/18 1554 07/02/18 1633 07/02/18 2108 07/03/18 0523  BP: (!) 110/56 (!) 114/58 117/80 109/73  Pulse: (!) 102 (!) 107 (!) 124 (!) 113  Resp: 20 18 18 18   Temp: 98.3 F (36.8 C)  98.6 F (37 C) 99 F (37.2 C)  TempSrc:   Oral Oral  SpO2: 91% 99% 94% 100%  Weight:  70.6 kg    Height:  5\' 10"  (1.778 m)      Wt Readings from Last 3 Encounters:  07/02/18 70.6 kg  06/03/18 68.5 kg  05/18/18 72.1 kg     Intake/Output Summary (Last 24 hours) at 07/03/2018 1001 Last data filed at 07/03/2018 0554 Gross per 24 hour  Intake 2209.63 ml  Output 201 ml  Net 2008.63 ml   Physical exam General: Elderly male, not in distress, better communicative today HEENT: Moist mucosa, supple neck Chest: Minimal crackles over right lung (improved from yesterday), CVS: Normal S1-S2, no murmurs GI: Soft, nondistended, nontender Musculoskeletal: Warm, no edema CNS: Alert and oriented (improved from yesterday)    Data Review:    CBC Recent Labs  Lab 07/02/18 1159 07/03/18 0651  WBC 13.1* 17.3*  HGB 14.1 12.3*  HCT 40.9 35.2*  PLT 155 136*  MCV 91.7 92.1  MCH 31.6 32.2  MCHC 34.5 34.9  RDW 13.2 13.5    Chemistries  Recent Labs  Lab 07/02/18 1159 07/03/18 0651  NA 139 139  K 3.5 3.2*  CL  102 108  CO2 28 22  GLUCOSE 141* 94  BUN 19 22  CREATININE 0.87 0.77  CALCIUM 10.0 8.9   ------------------------------------------------------------------------------------------------------------------ No results for input(s): CHOL, HDL, LDLCALC, TRIG, CHOLHDL, LDLDIRECT in the last 72 hours.  Lab Results  Component Value Date   HGBA1C 5.3 04/17/2018    ------------------------------------------------------------------------------------------------------------------ No results for input(s): TSH, T4TOTAL, T3FREE, THYROIDAB in the last 72 hours.  Invalid input(s): FREET3 ------------------------------------------------------------------------------------------------------------------ No results for input(s): VITAMINB12, FOLATE, FERRITIN, TIBC, IRON, RETICCTPCT in the last 72 hours.  Coagulation profile Recent Labs  Lab 07/02/18 1159  INR 1.2    No results for input(s): DDIMER in the last 72 hours.  Cardiac Enzymes No results for input(s): CKMB, TROPONINI, MYOGLOBIN in the last 168 hours.  Invalid input(s): CK ------------------------------------------------------------------------------------------------------------------    Component Value Date/Time   BNP 132.0 (H) 03/16/2017 1925    Inpatient Medications  Scheduled Meds: . abiraterone acetate  500 mg Oral Daily  . apixaban  5 mg Oral BID  . calcium-vitamin D  1 tablet Oral Daily  . cholecalciferol  1,000 Units Oral q morning - 10a  . diltiazem  30 mg Oral Q8H  . donepezil  10 mg Oral QHS  . feeding supplement (ENSURE ENLIVE)  237 mL Oral Daily  . fluticasone  2 spray Each Nare Daily  . furosemide  20 mg Oral Daily  . insulin aspart  0-5 Units Subcutaneous QHS  . insulin aspart  0-9 Units Subcutaneous TID WC  . lisinopril  20 mg Oral Daily  . mirtazapine  7.5 mg Oral QHS  . multivitamin with minerals  1 tablet Oral q morning - 10a  . pantoprazole  40 mg Oral Daily  . polycarbophil  625 mg Oral BID  . potassium chloride  20 mEq Oral Daily  . predniSONE  2.5 mg Oral BID  . sodium chloride flush  3 mL Intravenous Q12H   Continuous Infusions: . sodium chloride 75 mL/hr at 07/02/18 1654  . ceFEPime (MAXIPIME) IV 2 g (07/03/18 0551)  . vancomycin 750 mg (07/03/18 0027)   PRN Meds:.acetaminophen **OR** acetaminophen, guaiFENesin, loperamide, ondansetron **OR**  ondansetron (ZOFRAN) IV  Micro Results Recent Results (from the past 240 hour(s))  Blood culture (routine x 2)     Status: None (Preliminary result)   Collection Time: 07/02/18 12:07 PM  Result Value Ref Range Status   Specimen Description   Final    RIGHT ANTECUBITAL BOTTLES DRAWN AEROBIC AND ANAEROBIC   Special Requests   Final    Blood Culture adequate volume Performed at [facility], [address] [place], [place] [address]    Culture PENDING  Incomplete   Report Status PENDING  Incomplete    Radiology Reports Dg Chest 2 View  Result Date: 07/02/2018 CLINICAL DATA:  Cough.  Fever. EXAM: CHEST - 2 VIEW COMPARISON:  April 16, 2018 FINDINGS: New infiltrates seen in the right base. No other definitive infiltrates are noted. No pneumothorax. The cardiomediastinal silhouette is stable. IMPRESSION: New right basilar and middle lobe infiltrate consistent with pneumonia given history. Recommend treatment with short-term follow-up to ensure resolution. Electronically Signed   By: Dorise Bullion III M.D   On: 07/02/2018 13:13    Time Spent in minutes  25   Zonnique Norkus M.D on 07/03/2018 at 10:01 AM  Between 7am to 7pm - Pager - [phone]  After 7pm go to [email or web address] - password [facility]  [facility] -  Office  [phone]

## 2018-07-04 ENCOUNTER — Other Ambulatory Visit (HOSPITAL_COMMUNITY): Payer: Medicare Other

## 2018-07-04 ENCOUNTER — Ambulatory Visit (HOSPITAL_COMMUNITY): Payer: Medicare Other

## 2018-07-04 ENCOUNTER — Inpatient Hospital Stay (HOSPITAL_COMMUNITY): Payer: Medicare Other

## 2018-07-04 DIAGNOSIS — E1169 Type 2 diabetes mellitus with other specified complication: Secondary | ICD-10-CM

## 2018-07-04 DIAGNOSIS — C49A0 Gastrointestinal stromal tumor, unspecified site: Secondary | ICD-10-CM

## 2018-07-04 DIAGNOSIS — I480 Paroxysmal atrial fibrillation: Secondary | ICD-10-CM

## 2018-07-04 LAB — CBC
HCT: 31 % — ABNORMAL LOW (ref 39.0–52.0)
Hemoglobin: 10.2 g/dL — ABNORMAL LOW (ref 13.0–17.0)
MCH: 31.8 pg (ref 26.0–34.0)
MCHC: 32.9 g/dL (ref 30.0–36.0)
MCV: 96.6 fL (ref 80.0–100.0)
Platelets: 113 10*3/uL — ABNORMAL LOW (ref 150–400)
RBC: 3.21 MIL/uL — ABNORMAL LOW (ref 4.22–5.81)
RDW: 13.9 % (ref 11.5–15.5)
WBC: 9.4 10*3/uL (ref 4.0–10.5)
nRBC: 0 % (ref 0.0–0.2)

## 2018-07-04 LAB — GLUCOSE, CAPILLARY
Glucose-Capillary: 120 mg/dL — ABNORMAL HIGH (ref 70–99)
Glucose-Capillary: 173 mg/dL — ABNORMAL HIGH (ref 70–99)
Glucose-Capillary: 211 mg/dL — ABNORMAL HIGH (ref 70–99)
Glucose-Capillary: 208 mg/dL — ABNORMAL HIGH (ref 70–99)

## 2018-07-04 LAB — URINE CULTURE: Culture: <10000 — AB

## 2018-07-04 MED ORDER — SODIUM CHLORIDE 0.9 % IV SOLN
2.0000 g | Freq: Three times a day (TID) | INTRAVENOUS | Status: DC
  Administered 2018-07-04 – 2018-07-08 (×11): 2 g via INTRAVENOUS
  Filled 2018-07-04 (×11): qty 2

## 2018-07-04 MED ORDER — VANCOMYCIN HCL IN DEXTROSE 750-5 MG/150ML-% IV SOLN
750.0000 mg | Freq: Two times a day (BID) | INTRAVENOUS | Status: DC
  Administered 2018-07-04 – 2018-07-06 (×4): 750 mg via INTRAVENOUS
  Filled 2018-07-04 (×4): qty 150

## 2018-07-04 MED ORDER — SODIUM CHLORIDE 0.9 % IV SOLN
INTRAVENOUS | Status: DC | PRN
  Administered 2018-07-04: 250 mL via INTRAVENOUS
  Administered 2018-07-06: 500 mL via INTRAVENOUS
  Administered 2018-07-08: 250 mL via INTRAVENOUS

## 2018-07-04 NOTE — Progress Notes (Signed)
PROGRESS NOTE                                                                                                                                                                                                             Patient Demographics:    [name], is a 85 y.o. male, DOB - [birth year], MVH:[id number]  Admit date - 07/02/2018   Admitting Physician No admitting provider for patient encounter.  Outpatient Primary MD for the patient is Rosita Fire, MD  LOS - 2  Outpatient Specialists:  Chief Complaint  Patient presents with  . Cough  . Fever       Brief Narrative   85 year old male with metastatic prostate cancer, A. fib on Eliquis, type 2 diabetes mellitus, NHL completed cycle of R-CHOP without relapse, peripheral vascular disease, hypertension, chronic diarrhea and GERD with 2 prior hospitalization in the past 6 months with pneumonia and concern for aspiration brought from home with increased urinary frequency for past 5 days and 1 day of cough with chills, congestion and shortness of breath. Patient found to be in early sepsis secondary to UTI and lobar pneumonia.   Subjective:   Still has some cough. No shortness of breath   Assessment  & Plan :   Sepsis ([place]) secondary to UTI and healthcare associated pneumonia Sepsis pathway initiated in the ED.  Currently resolved, has worsened leukocytosis today which will be monitored.  DC fluids.    Active Problems: UTI secondary to Enterococcus faecalis Culture sent on 3/9 (by home health) for dysuria growing 10-50 K organism, sensitive to vancomycin, nitrofurantoin and ampicillin as per daughter.  Continue empiric vancomycin.  Follow urine culture sent in the ED.    HCAP (healthcare-associated pneumonia) Recurrent pneumonia in past 6 months (third episode) with aspiration risk.  Last hospitalized in December 2019. Empiric IV vancomycin and cefepime.  Follow  blood culture, urine culture, urine strep and Legionella antigen. Continue antitussives. Daughter reports that patient has been followed by  swallow nurse at home.  Placed on dysphagia level 1 diet and consult SLP. Had MBSS performed and it was felt that symptoms may be related to esophageal issues. He has required esophageal dilatation in the past. Will request GI input to see if repeat dilatation is warranted.  Dementia with acute delirium Secondary to acute illness.  Continue Aricept.  Has  had sundowning during previous hospital stay.  Monitor closely.   Avoid benzos and narcotics.  Overall mental status is better  Paroxysmal A. fib Currently rate controlled.    Continue Cardizem and Eliquis.    NHL (non-Hodgkin's lymphoma) (HCC)   GIST (gastrointestinal stromal tumor), malignant ([place])   Prostate cancer ([place]) Follows with Dr. Delton Coombes.  Has bony metastasis.  Continue outpatient prednisone and Zytiga    Type 2 diabetes mellitus (HCC) CBG stable.  Holding oral hypoglycemic and monitor on sliding scale coverage.  Hypokalemia Replenished   Code Status : DNR  Family Communication  : daughter at bedside  Disposition Plan  : PT evaluation and GI evaluation needed prior to disposition  Barriers For Discharge : Active symptoms  Consults  : None  Procedures  : None  DVT Prophylaxis  : Eliquis  Lab Results  Component Value Date   PLT 113 (L) 07/04/2018    Antibiotics  :    Anti-infectives (From admission, onward)   Start     Dose/Rate Route Frequency Ordered Stop   07/04/18 1400  ceFEPIme (MAXIPIME) 2 g in sodium chloride 0.9 % 100 mL IVPB     2 g 200 mL/hr over 30 Minutes Intravenous Every 8 hours 07/04/18 1112     07/04/18 1400  vancomycin (VANCOCIN) IVPB 750 mg/150 ml premix     750 mg 150 mL/hr over 60 Minutes Intravenous Every 12 hours 07/04/18 1112     07/03/18 0100  vancomycin (VANCOCIN) IVPB 750 mg/150 ml premix  Status:  Discontinued     750 mg 150 mL/hr  over 60 Minutes Intravenous Every 12 hours 07/02/18 1659 07/04/18 1044   07/02/18 1700  ceFEPIme (MAXIPIME) 1 g in sodium chloride 0.9 % 100 mL IVPB  Status:  Discontinued     1 g 200 mL/hr over 30 Minutes Intravenous Every 8 hours 07/02/18 1650 07/02/18 1656   07/02/18 1700  ceFEPIme (MAXIPIME) 2 g in sodium chloride 0.9 % 100 mL IVPB  Status:  Discontinued     2 g 200 mL/hr over 30 Minutes Intravenous Every 8 hours 07/02/18 1624 07/04/18 1044   07/02/18 1200  vancomycin (VANCOCIN) 1,250 mg in sodium chloride 0.9 % 250 mL IVPB  Status:  Discontinued     1,250 mg 166.7 mL/hr over 90 Minutes Intravenous  Once 07/02/18 1146 07/02/18 1155   07/02/18 1200  Vancomycin (VANCOCIN) 1,250 mg in sodium chloride 0.9 % 250 mL IVPB     1,250 mg 166.7 mL/hr over 90 Minutes Intravenous  Once 07/02/18 1155 07/02/18 1429        Objective:   Vitals:   07/03/18 2108 07/03/18 2152 07/04/18 0531 07/04/18 1413  BP: 118/75  129/66 (!) 150/73  Pulse: (!) 58 99 87 75  Resp: 18  18 20   Temp: 97.8 F (36.6 C)  98 F (36.7 C) 98.3 F (36.8 C)  TempSrc: Oral  Oral Oral  SpO2: 96%  100% 100%  Weight:      Height:        Wt Readings from Last 3 Encounters:  07/02/18 70.6 kg  06/03/18 68.5 kg  05/18/18 72.1 kg     Intake/Output Summary (Last 24 hours) at 07/04/2018 1720 Last data filed at 07/04/2018 1413 Gross per 24 hour  Intake 910 ml  Output 1054 ml  Net -144 ml   Physical exam General exam: Alert, awake, oriented x 3 Respiratory system: Clear to auscultation. Respiratory effort normal. Cardiovascular system:RRR. No murmurs, rubs, gallops. Gastrointestinal system:  Abdomen is nondistended, soft and nontender. No organomegaly or masses felt. Normal bowel sounds heard. Central nervous system: No focal neurological deficits. Extremities: No C/C/E, +pedal pulses Skin: No rashes, lesions or ulcers Psychiatry: pleasant, cooperative      Data Review:    CBC Recent Labs  Lab 07/02/18 1159  07/03/18 0651 07/04/18 0514  WBC 13.1* 17.3* 9.4  HGB 14.1 12.3* 10.2*  HCT 40.9 35.2* 31.0*  PLT 155 136* 113*  MCV 91.7 92.1 96.6  MCH 31.6 32.2 31.8  MCHC 34.5 34.9 32.9  RDW 13.2 13.5 13.9    Chemistries  Recent Labs  Lab 07/02/18 1159 07/03/18 0651  NA 139 139  K 3.5 3.2*  CL 102 108  CO2 28 22  GLUCOSE 141* 94  BUN 19 22  CREATININE 0.87 0.77  CALCIUM 10.0 8.9   ------------------------------------------------------------------------------------------------------------------ No results for input(s): CHOL, HDL, LDLCALC, TRIG, CHOLHDL, LDLDIRECT in the last 72 hours.  Lab Results  Component Value Date   HGBA1C 5.3 04/17/2018   ------------------------------------------------------------------------------------------------------------------ No results for input(s): TSH, T4TOTAL, T3FREE, THYROIDAB in the last 72 hours.  Invalid input(s): FREET3 ------------------------------------------------------------------------------------------------------------------ No results for input(s): VITAMINB12, FOLATE, FERRITIN, TIBC, IRON, RETICCTPCT in the last 72 hours.  Coagulation profile Recent Labs  Lab 07/02/18 1159  INR 1.2    No results for input(s): DDIMER in the last 72 hours.  Cardiac Enzymes No results for input(s): CKMB, TROPONINI, MYOGLOBIN in the last 168 hours.  Invalid input(s): CK ------------------------------------------------------------------------------------------------------------------    Component Value Date/Time   BNP 132.0 (H) 03/16/2017 1925    Inpatient Medications  Scheduled Meds: . abiraterone acetate  500 mg Oral Daily  . apixaban  5 mg Oral BID  . calcium-vitamin D  1 tablet Oral Daily  . cholecalciferol  1,000 Units Oral q morning - 10a  . diltiazem  30 mg Oral Q8H  . donepezil  10 mg Oral QHS  . feeding supplement (ENSURE ENLIVE)  237 mL Oral Daily  . fluticasone  2 spray Each Nare Daily  . furosemide  20 mg Oral Daily  .  insulin aspart  0-5 Units Subcutaneous QHS  . insulin aspart  0-9 Units Subcutaneous TID WC  . lisinopril  20 mg Oral Daily  . mirtazapine  7.5 mg Oral QHS  . multivitamin with minerals  1 tablet Oral q morning - 10a  . pantoprazole  40 mg Oral Daily  . polycarbophil  625 mg Oral BID  . potassium chloride  20 mEq Oral Daily  . predniSONE  2.5 mg Oral BID  . sodium chloride flush  3 mL Intravenous Q12H   Continuous Infusions: . sodium chloride 250 mL (07/04/18 1558)  . ceFEPime (MAXIPIME) IV 2 g (07/04/18 1608)  . vancomycin     PRN Meds:.sodium chloride, acetaminophen **OR** acetaminophen, guaiFENesin, haloperidol lactate, loperamide, ondansetron **OR** ondansetron (ZOFRAN) IV  Micro Results Recent Results (from the past 240 hour(s))  Blood culture (routine x 2)     Status: None (Preliminary result)   Collection Time: 07/02/18 12:07 PM  Result Value Ref Range Status   Specimen Description   Final    RIGHT ANTECUBITAL BOTTLES DRAWN AEROBIC AND ANAEROBIC   Special Requests Blood Culture adequate volume  Final   Culture   Final    NO GROWTH 2 DAYS Performed at [facility], [address], [place], [place] [address]    Report Status PENDING  Incomplete  Urine culture     Status: Abnormal   Collection Time: 07/02/18  2:51 PM  Result Value Ref Range Status   Specimen Description   Final    URINE, CLEAN CATCH Performed at [facility], [address], [place], [place] [address]    Special Requests   Final    NONE Performed at [facility], [address] [place], [place] [address]    Culture (A)  Final    <10,000 COLONIES/mL INSIGNIFICANT GROWTH Performed at [facility] [address], [place], [place] [address]    Report Status 07/04/2018 FINAL  Final  MRSA PCR Screening     Status: None   Collection Time: 07/03/18  9:40 AM  Result Value Ref Range Status   MRSA by PCR NEGATIVE NEGATIVE Final    Comment:        The GeneXpert MRSA Assay (FDA approved for NASAL  specimens only), is one component of a comprehensive MRSA colonization surveillance program. It is not intended to diagnose MRSA infection nor to guide or monitor treatment for MRSA infections. Performed at [facility], [address], [place], [place] [address]     Radiology Reports Dg Chest 2 View  Result Date: 07/02/2018 CLINICAL DATA:  Cough.  Fever. EXAM: CHEST - 2 VIEW COMPARISON:  April 16, 2018 FINDINGS: New infiltrates seen in the right base. No other definitive infiltrates are noted. No pneumothorax. The cardiomediastinal silhouette is stable. IMPRESSION: New right basilar and middle lobe infiltrate consistent with pneumonia given history. Recommend treatment with short-term follow-up to ensure resolution. Electronically Signed   By: Dorise Bullion III M.D   On: 07/02/2018 13:13    Time Spent in minutes  25   Kathie Dike M.D on 07/04/2018 at 5:20 PM  After 7pm go to [email or web address]    [facility] -  Office  [phone]

## 2018-07-04 NOTE — Progress Notes (Signed)
Modified Barium Swallow Progress Note  Patient Details  Name: [name] MRN: [id number] Date of Birth: 11/28/1923  Today's Date: 07/04/2018  Modified Barium Swallow completed.  Full report located under Chart Review in the Imaging Section.  Brief recommendations include the following:  Clinical Impression  Pt presents with normal oropharyngeal phase of swallow and suspected esophageal dysphagia characterized by min delayed oral transit with solids and piecemeal deglutition, swallow trigger at the level of the valleculae across textures and consistencies (except for with straw sips), and min decreased tongue base retraction resulting in trace base of tongue residuals post swallow which clear with repeat swallow. Pt had a single episode of trace penetration of thins when taking sequential straw sips of thin, however no aspiration observed. The barium tablet became delayed in the distal esophagus and never passed through the UES despite Pt being given extra solids, liquids, and extra time. Pt with history of esophageal dilation with Dr. Laural Golden in the past and may benefit from an outpatient appointment with him. Pt was given puree bolus in attempt to clear the pill, however backflow of bolus was noted to the level of the cervical esophagus. Pt reportedly with several bouts of PNA in the past few months and question whether related to esophageal dysphagia. Reflux precautions and results of MBSS were reviewed with the Pt and his daughter. Recommend D3/mech soft and thin liquids, swallow two times for each sip if using a straw due to hand tremor, consume more frequent/smaller meals, and remain upright for at least 30 minutes following meals.    Swallow Evaluation Recommendations   Recommended Consults: Consider esophageal assessment   SLP Diet Recommendations: Dysphagia 3 (Mech soft) solids;Thin liquid   Liquid Administration via: Cup;Straw   Medication Administration: Whole meds with liquid   Supervision: Staff to assist with self feeding;Intermittent supervision to cue for compensatory strategies   Compensations: Multiple dry swallows after each bite/sip   Postural Changes: Remain semi-upright after after feeds/meals (Comment);Seated upright at 90 degrees   Oral Care Recommendations: Oral care BID;Staff/trained caregiver to provide oral care   Other Recommendations: Clarify dietary restrictions   Thank you,  Genene Churn, [place] 07/04/2018,5:08 PM

## 2018-07-04 NOTE — Care Management Important Message (Signed)
Important Message  Patient Details  Name: [name] MRN: [id number] Date of Birth: 1923/05/14   Medicare Important Message Given:  Yes    Tommy Medal 07/04/2018, 3:35 PM

## 2018-07-04 NOTE — TOC Initial Note (Signed)
Transition of Care [facility]) - Initial/Assessment Note    Patient Details  Name: [name] MRN: [id number] Date of Birth: 03-02-1924  [facility] of [facility]) CM/SW Contact:    Francess Mullen, Chauncey Reading, RN Phone Number: 07/04/2018, 2:18 PM  Clinical Narrative:   Patient not in room, at a procedure for SLP evaluation. Chart reviewed. Per notes from home with wife, has dementia. Daughters take turns spending the night. Has RW, toilet riser and 3 in 1. Active with [facility] for RN, PT, and OT.     TOC following for needs.             Expected Discharge Plan: [place]      Expected Discharge Plan and Services Expected Discharge Plan: [place]   Post Acute Care Choice: Resumption of Svcs/PTA Provider           Activities of Daily Living Home Assistive Devices/Equipment: Gilford Rile (specify type), Raised toilet seat with rails, Bedside commode/3-in-1 ADL Screening (condition at time of admission) Patient's cognitive ability adequate to safely complete daily activities?: Yes Is the patient deaf or have difficulty hearing?: Yes Does the patient have difficulty seeing, even when wearing glasses/contacts?: No Does the patient have difficulty concentrating, remembering, or making decisions?: Yes Patient able to express need for assistance with ADLs?: Yes Does the patient have difficulty dressing or bathing?: Yes Independently performs ADLs?: No Communication: Independent Dressing (OT): Needs assistance Is this a change from baseline?: Pre-admission baseline Grooming: Needs assistance Is this a change from baseline?: Pre-admission baseline Feeding: Needs assistance Is this a change from baseline?: Pre-admission baseline Bathing: Needs assistance Toileting: Needs assistance Is this a change from baseline?: Pre-admission baseline In/Out Bed: Needs assistance Is this a change from baseline?: Pre-admission baseline Walks in Home: Needs assistance Is  this a change from baseline?: Pre-admission baseline Does the patient have difficulty walking or climbing stairs?: Yes Weakness of Legs: Both Weakness of Arms/Hands: None     Admission diagnosis:  Cough [R05] Acute cystitis without hematuria [N30.00] Patient Active Problem List   Diagnosis Date Noted  . Sepsis secondary to UTI ([place]) 07/02/2018  . HCAP (healthcare-associated pneumonia) 07/02/2018  . CAP (community acquired pneumonia) 04/16/2018  . Dementia without behavioral disturbance ([place]) 04/16/2018  . Dehydration 04/16/2018  . Acute metabolic encephalopathy 79/39/0300  . Encephalopathy acute 01/31/2018  . Aspiration pneumonia ([place]) 01/31/2018  . Diarrhea 11/25/2017  . Acute lower UTI 11/25/2017  . PAD (peripheral artery disease) ([place]) 08/27/2017  . Great toe amputation status, left 07/30/2017  . Osteomyelitis of toe of left foot ([place]) 06/29/2017  . Acute bronchitis with bronchospasm 03/16/2017  . Type 2 diabetes mellitus ([place]) 03/16/2017  . Hypomagnesemia 03/16/2017  . 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 Postural dizziness with presyncope 02/19/2017  . Polyneuropathy 02/19/2017  . Numbness 02/19/2017  . Urinary frequency 02/19/2017  . Genetic testing 02/09/2017  . Family history of breast cancer   . Family history of colon cancer   . Other dysphagia 01/21/2016  . Bone metastases ([place]) 08/21/2015  . Prostate cancer ([place]) 09/08/2013  . Hypertrophy of prostate with urinary obstruction and other lower urinary tract symptoms (LUTS) 08/10/2013  . Encounter for therapeutic drug monitoring 06/12/2013  . NHL (non-Hodgkin's lymphoma) ([place]) 08/18/2011  . GIST (gastrointestinal stromal tumor), malignant ([place]) 08/18/2011  . Long term (current) use of anticoagulants 07/18/2010  . Essential hypertension 10/04/2008  . ATRIAL FIBRILLATION 10/04/2008   PCP:  Rosita Fire, MD Pharmacy:  [facility] [facility], [place] - [address]  [place] [address] Phone: [phone] Fax: [phone]  [facility] [name], [address] [place] [address] Phone: [phone] Fax: [facility] - [place], [place] [address] Phone: [phone] Fax: [phone]  Readmission Risk Interventions 30 Day Unplanned Readmission Risk Score     ED to Hosp-Admission (Current) from 07/02/2018 in [place]  30 Day Unplanned Readmission Risk Score (%)  42 Filed at 07/04/2018 1200     This score is the patient's risk of an unplanned readmission within 30 days of being discharged (0 -100%). The score is based on dignosis, age, lab data, medications, orders, and past utilization.   Low:  0-14.9   Medium: 15-21.9   High: 22-29.9   Extreme: 30 and above

## 2018-07-04 NOTE — Evaluation (Addendum)
Clinical/Bedside Swallow Evaluation Patient Details  Name: [name] MRN: [id number] Date of Birth: 1923/10/02  Today's Date: 07/04/2018 Time: SLP Start Time (ACUTE ONLY): 8366 SLP Stop Time (ACUTE ONLY): 0846 SLP Time Calculation (min) (ACUTE ONLY): 23 min  Past Medical History:  Past Medical History:  Diagnosis Date  . Acute bronchitis   . Arthritis   . Atrial fibrillation ([place])    Dr. Harl Bowie- [facility] follows saw 11'14  . Bladder stones    tx. with oral meds and antibiotics, now surgery planned  . Cataracts, both eyes    surgery planned May 2015  . Diabetes mellitus without complication ([place])    Type II  . Dyspnea    with activity  . Dysrhythmia    Afib  . Family history of breast cancer   . Family history of colon cancer   . GERD (gastroesophageal reflux disease)   . 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 Margot Chimes, thus far without recurrence.   . Hypertension   . NHL (non-Hodgkin's lymphoma) ([place]) 2005   Diffuse large B-cell lymphoma, clinically stage IIIA, CD20 positive, status post cervical lymph node biopsy 08/03/2003 on the left. PET scan was also positive in the spleen and small bowel region, but bone marrow aspiration and biopsy were negative. So he essentially had stage IIIAs. He received R-CHOP x6 cycles with CR established by PET scan criteria on 11/23/2003 with no evidence for relapse th  . PAD (peripheral artery disease) ([place]) 08/27/2017  . Skin cancer    Basal cell- face,head, neck,  arms, legs, Back   Past Surgical History:  Past Surgical History:  Procedure Laterality Date  . AMPUTATION Left 06/29/2017   Procedure: AMPUTATION LEFT GREAT TOE;  Surgeon: [name], MD;  Location: [facility];  Service: Vascular;   Laterality: Left;  . AMPUTATION Left 08/27/2017   Procedure: AMPUTATION Left SECOND TOE;  Surgeon: [name], MD;  Location: [facility];  Service: Vascular;  Laterality: Left;  . CATARACT EXTRACTION Bilateral 2015  . CHOLECYSTECTOMY    . COLON RESECTION     small bowel  . CYSTOSCOPY WITH LITHOLAPAXY N/A 08/10/2013   Procedure: CYSTOSCOPY WITH LITHOLAPAXY WITH [name];  Surgeon: [name], MD;  Location: [facility];  Service: Urology;  Laterality: N/A;  . ESOPHAGEAL DILATION N/A 02/27/2016   Procedure: ESOPHAGEAL DILATION;  Surgeon: [name], MD;  [facility];  Service: Endoscopy;  Laterality: N/A;  . ESOPHAGOGASTRODUODENOSCOPY N/A 02/27/2016   Procedure: ESOPHAGOGASTRODUODENOSCOPY (EGD);  Surgeon: [name], MD;  [facility];  Service: Endoscopy;  Laterality: N/A;  12:00  . INCISION AND DRAINAGE PERIRECTAL ABSCESS    . LOWER EXTREMITY ANGIOGRAPHY N/A 06/25/2017   Procedure: LOWER EXTREMITY ANGIOGRAPHY;  Surgeon: [name], MD;  Location: [facility];  Service: Cardiovascular;  Laterality: N/A;  . LYMPH NODE DISSECTION Left    '05-neck  . PORT-A-CATH REMOVAL    . PORTACATH PLACEMENT     insertion and removal -last chemotherapy 10 yrs ago  . TRANSURETHRAL RESECTION OF PROSTATE N/A 08/10/2013   Procedure: TRANSURETHRAL RESECTION OF THE PROSTATE WITH GYRUS INSTRUMENTS;  Surgeon: [name], MD;  Location: [facility];  Service: Urology;  Laterality: N/A;   HPI:  85 year old male with metastatic prostate cancer,  A. fib on Eliquis, type 2 diabetes mellitus, NHL completed cycle of R-CHOP without relapse, peripheral vascular disease, hypertension, chronic diarrhea and GERD with 2 prior hospitalization in the past 6 months with pneumonia and concern for aspiration brought from home with increased urinary frequency for past 5 days and 1 day of cough with chills, congestion and shortness of breath. Patient found to be in early sepsis secondary  to UTI and lobar pneumonia. ST to complete BSE   Assessment / Plan / Recommendation Clinical Impression  Administered clinical swallowing evaluation while Pt was seated upright in bed with his breakfast tray; he is currently on a D1/puree diet with thin liquids (MD ordered). Daughter reports he has had increasing difficulty with swallowing over the past year (tolerating a mechanical soft diet at home) and has had ~ 4 episodes of pneumonia. He has received [place] for dysphagia who recommended he "may need an MBS" per daughter report. She further reports history of esophageal dilation and GERD for which he is on medication. Pt reports increased burping and globus sensation in the past few months during PO. Pt demonstrated decreased oral coordination with trials of puree, mild oral residue and multiple swallows with each bite. With thin liquids note anterior spillage, audible swallows, suspected delay in swallowing trigger and subsequent belching after each swallow of thin liquids. Mild wet vocal quality was inconsistently noted throughout trials. With above noted history, current CXR "consistent with PNA" and given s/sx of oropharyngeal dysphagia presenting at bedside recommend proceed with instrumental testing today to objectively assess the swallowing function and determine LRD. SLP Visit Diagnosis: Dysphagia, unspecified (R13.10)    Aspiration Risk  Mild aspiration risk;Moderate aspiration risk    Diet Recommendation Dysphagia 1 (Puree);Thin liquid   Liquid Administration via: Cup;Straw Medication Administration: Crushed with puree Supervision: Full supervision/cueing for compensatory strategies;Staff to assist with self feeding Compensations: Minimize environmental distractions;Slow rate;Small sips/bites;Follow solids with liquid;Clear throat intermittently Postural Changes: Seated upright at 90 degrees;Remain upright for at least 30 minutes after po intake    Other  Recommendations Oral Care  Recommendations: Oral care BID   Follow up Recommendations        Frequency and Duration min 1 x/week  1 week       Prognosis Prognosis for Safe Diet Advancement: Good      Swallow Study   General Date of Onset: 07/02/18 HPI: 85 year old male with metastatic prostate cancer, A. fib on Eliquis, type 2 diabetes mellitus, NHL completed cycle of R-CHOP without relapse, peripheral vascular disease, hypertension, chronic diarrhea and GERD with 2 prior hospitalization in the past 6 months with pneumonia and concern for aspiration brought from home with increased urinary frequency for past 5 days and 1 day of cough with chills, congestion and shortness of breath. Patient found to be in early sepsis secondary to UTI and lobar pneumonia. ST to complete BSE Type of Study: Bedside Swallow Evaluation Previous Swallow Assessment: BSE 01/2018 Diet Prior to this Study: Dysphagia 1 (puree);Thin liquids Temperature Spikes Noted: No Respiratory Status: Nasal cannula(2 L/mn) History of Recent Intubation: No Behavior/Cognition: Alert;Cooperative;Pleasant mood Oral Cavity Assessment: Within Functional Limits Oral Care Completed by SLP: Recent completion by staff Oral Cavity - Dentition: Dentures, top;Edentulous(no dentures, bottom) Vision: Functional for self-feeding Self-Feeding Abilities: Able to feed self Patient Positioning: Upright in bed Baseline Vocal Quality: Normal Volitional Cough: Strong Volitional Swallow: Able to elicit    Oral/Motor/Sensory Function Overall Oral Motor/Sensory Function: Within functional limits   Ice Chips Ice chips: Not tested  Thin Liquid Thin Liquid: Impaired Presentation: Cup;Straw Oral Phase Impairments: Reduced lingual movement/coordination Oral Phase Functional Implications: Right anterior spillage;Left anterior spillage Pharyngeal  Phase Impairments: Multiple swallows;Suspected delayed Swallow;Throat Clearing - Delayed    Nectar Thick Nectar Thick Liquid:  Not tested   Honey Thick Honey Thick Liquid: Not tested   Puree Puree: Impaired Presentation: Spoon Oral Phase Impairments: Reduced lingual movement/coordination Oral Phase Functional Implications: Oral residue;Prolonged oral transit Pharyngeal Phase Impairments: Multiple swallows;Suspected delayed Swallow   Solid     Solid: Not tested     Amelia H. Roddie Mc, CCC-SLP Speech Language Pathologist  Wende Bushy 07/04/2018,10:07 AM

## 2018-07-05 DIAGNOSIS — A419 Sepsis, unspecified organism: Secondary | ICD-10-CM

## 2018-07-05 DIAGNOSIS — N390 Urinary tract infection, site not specified: Secondary | ICD-10-CM

## 2018-07-05 DIAGNOSIS — R933 Abnormal findings on diagnostic imaging of other parts of digestive tract: Secondary | ICD-10-CM

## 2018-07-05 DIAGNOSIS — Z7901 Long term (current) use of anticoagulants: Secondary | ICD-10-CM

## 2018-07-05 LAB — CBC
HCT: 31.7 % — ABNORMAL LOW (ref 39.0–52.0)
Hemoglobin: 11.1 g/dL — ABNORMAL LOW (ref 13.0–17.0)
MCH: 32.4 pg (ref 26.0–34.0)
MCHC: 35 g/dL (ref 30.0–36.0)
MCV: 92.4 fL (ref 80.0–100.0)
Platelets: 127 10*3/uL — ABNORMAL LOW (ref 150–400)
RBC: 3.43 MIL/uL — ABNORMAL LOW (ref 4.22–5.81)
RDW: 13.2 % (ref 11.5–15.5)
WBC: 6.3 10*3/uL (ref 4.0–10.5)
nRBC: 0 % (ref 0.0–0.2)

## 2018-07-05 LAB — BASIC METABOLIC PANEL
Anion gap: 8 (ref 5–15)
BUN: 18 mg/dL (ref 8–23)
CO2: 21 mmol/L — ABNORMAL LOW (ref 22–32)
Calcium: 8.9 mg/dL (ref 8.9–10.3)
Chloride: 107 mmol/L (ref 98–111)
Creatinine, Ser: 0.75 mg/dL (ref 0.61–1.24)
GFR calc Af Amer: >60 mL/min (ref >60)
GFR calc non Af Amer: >60 mL/min (ref >60)
Glucose, Bld: 176 mg/dL — ABNORMAL HIGH (ref 70–99)
Potassium: 3.4 mmol/L — ABNORMAL LOW (ref 3.5–5.1)
Sodium: 136 mmol/L (ref 135–145)

## 2018-07-05 LAB — GLUCOSE, CAPILLARY
Glucose-Capillary: 163 mg/dL — ABNORMAL HIGH (ref 70–99)
Glucose-Capillary: 225 mg/dL — ABNORMAL HIGH (ref 70–99)
Glucose-Capillary: 290 mg/dL — ABNORMAL HIGH (ref 70–99)
Glucose-Capillary: 122 mg/dL — ABNORMAL HIGH (ref 70–99)

## 2018-07-05 LAB — VANCOMYCIN, TROUGH: Vancomycin Tr: 20 ug/mL (ref 15–20)

## 2018-07-05 MED ORDER — BISACODYL 10 MG RE SUPP
10.0000 mg | Freq: Once | RECTAL | Status: DC
  Filled 2018-07-05: qty 1

## 2018-07-05 NOTE — Progress Notes (Signed)
PROGRESS NOTE                                                                                                                                                                                                             Patient Demographics:    [name], is a 85 y.o. male, DOB - 05/17/1923, LYY:[id number]  Admit date - 07/02/2018   Admitting Physician No admitting provider for patient encounter.  Outpatient Primary MD for the patient is Rosita Fire, MD  LOS - 3  Outpatient Specialists:  Chief Complaint  Patient presents with  . Cough  . Fever       Brief Narrative   85 year old male with metastatic prostate cancer, A. fib on Eliquis, type 2 diabetes mellitus, NHL completed cycle of R-CHOP without relapse, peripheral vascular disease, hypertension, chronic diarrhea and GERD with 2 prior hospitalization in the past 6 months with pneumonia and concern for aspiration brought from home with increased urinary frequency for past 5 days and 1 day of cough with chills, congestion and shortness of breath. Patient found to be in early sepsis secondary to UTI and lobar pneumonia.   Subjective:   Has occasional cough.  Shortness of breath is better.  Overall he is feeling improved.   Assessment  & Plan :   Sepsis ([place]) secondary to UTI and healthcare associated pneumonia Sepsis pathway initiated in the ED.  Currently resolved.  Blood cultures show no growth.  Leukocytosis resolved.  Active Problems: UTI secondary to Enterococcus faecalis Culture sent on 3/9 (by home health) for dysuria growing 10-50 K organism, sensitive to vancomycin, nitrofurantoin and ampicillin as per daughter.  Continue empiric vancomycin.  Follow urine culture sent in the ED.    HCAP (healthcare-associated pneumonia) Recurrent pneumonia in past 6 months (third episode) with aspiration risk.  Last hospitalized in December 2019. Empiric IV  vancomycin and cefepime.    Influenza panel was found to be negative.  Urinary antigens were ordered, but not sent.  Blood cultures are shown no growth. Continue antitussives. Daughter reports that patient has been followed by  swallow nurse at home.    He was seen by speech therapy and had MBSS performed. It was felt that symptoms may be related to esophageal issues. He has required esophageal dilatation in the past.  Seen by GI with plans for endoscopy on  3/19  Dementia with acute delirium Secondary to acute illness.  Continue Aricept.  Has had sundowning during previous hospital stay.  Monitor closely.   Avoid benzos and narcotics.  Overall mental status is better  Paroxysmal A. fib Currently rate controlled.    Continue Cardizem.  Eliquis on hold for endoscopy    NHL (non-Hodgkin's lymphoma) (HCC)   GIST (gastrointestinal stromal tumor), malignant ([place])   Prostate cancer ([place]) Follows with Dr. Delton Coombes.  Has bony metastasis.  Continue outpatient prednisone and Zytiga    Type 2 diabetes mellitus (HCC) CBG stable.  Holding oral hypoglycemic and monitor on sliding scale coverage.  Hypokalemia Replenished   Code Status : DNR  Family Communication  : daughter at bedside  Disposition Plan  : PT evaluation and GI evaluation needed prior to disposition  Barriers For Discharge : Active symptoms  Consults  : Gastroenterology  Procedures  : None  DVT Prophylaxis  : SCDs  Lab Results  Component Value Date   PLT 127 (L) 07/05/2018    Antibiotics  :    Anti-infectives (From admission, onward)   Start     Dose/Rate Route Frequency Ordered Stop   07/04/18 1400  ceFEPIme (MAXIPIME) 2 g in sodium chloride 0.9 % 100 mL IVPB     2 g 200 mL/hr over 30 Minutes Intravenous Every 8 hours 07/04/18 1112     07/04/18 1400  vancomycin (VANCOCIN) IVPB 750 mg/150 ml premix     750 mg 150 mL/hr over 60 Minutes Intravenous Every 12 hours 07/04/18 1112     07/03/18 0100  vancomycin  (VANCOCIN) IVPB 750 mg/150 ml premix  Status:  Discontinued     750 mg 150 mL/hr over 60 Minutes Intravenous Every 12 hours 07/02/18 1659 07/04/18 1044   07/02/18 1700  ceFEPIme (MAXIPIME) 1 g in sodium chloride 0.9 % 100 mL IVPB  Status:  Discontinued     1 g 200 mL/hr over 30 Minutes Intravenous Every 8 hours 07/02/18 1650 07/02/18 1656   07/02/18 1700  ceFEPIme (MAXIPIME) 2 g in sodium chloride 0.9 % 100 mL IVPB  Status:  Discontinued     2 g 200 mL/hr over 30 Minutes Intravenous Every 8 hours 07/02/18 1624 07/04/18 1044   07/02/18 1200  vancomycin (VANCOCIN) 1,250 mg in sodium chloride 0.9 % 250 mL IVPB  Status:  Discontinued     1,250 mg 166.7 mL/hr over 90 Minutes Intravenous  Once 07/02/18 1146 07/02/18 1155   07/02/18 1200  Vancomycin (VANCOCIN) 1,250 mg in sodium chloride 0.9 % 250 mL IVPB     1,250 mg 166.7 mL/hr over 90 Minutes Intravenous  Once 07/02/18 1155 07/02/18 1429        Objective:   Vitals:   07/05/18 0502 07/05/18 0738 07/05/18 0840 07/05/18 1326  BP: (!) 166/95  (!) 149/82 (!) 146/92  Pulse: 83   79  Resp: (!) 24   19  Temp: 98.8 F (37.1 C) (!) 97.1 F (36.2 C)  98.6 F (37 C)  TempSrc: Oral Oral  Oral  SpO2: 100%   100%  Weight:      Height:        Wt Readings from Last 3 Encounters:  07/02/18 70.6 kg  06/03/18 68.5 kg  05/18/18 72.1 kg     Intake/Output Summary (Last 24 hours) at 07/05/2018 2109 Last data filed at 07/05/2018 1700 Gross per 24 hour  Intake 1619.87 ml  Output 1551 ml  Net 68.87 ml   Physical exam General  exam: Alert, awake, no distress Respiratory system: Scattered rhonchi and occasional wheeze. Respiratory effort normal. Cardiovascular system:RRR. No murmurs, rubs, gallops. Gastrointestinal system: Abdomen is nondistended, soft and nontender. No organomegaly or masses felt. Normal bowel sounds heard. Central nervous system: No focal neurological deficits. Extremities: No C/C/E, +pedal pulses Skin: No rashes, lesions or  ulcers Psychiatry: Pleasant, cooperative      Data Review:    CBC Recent Labs  Lab 07/02/18 1159 07/03/18 0651 07/04/18 0514 07/05/18 0457  WBC 13.1* 17.3* 9.4 6.3  HGB 14.1 12.3* 10.2* 11.1*  HCT 40.9 35.2* 31.0* 31.7*  PLT 155 136* 113* 127*  MCV 91.7 92.1 96.6 92.4  MCH 31.6 32.2 31.8 32.4  MCHC 34.5 34.9 32.9 35.0  RDW 13.2 13.5 13.9 13.2    Chemistries  Recent Labs  Lab 07/02/18 1159 07/03/18 0651 07/05/18 0457  NA 139 139 136  K 3.5 3.2* 3.4*  CL 102 108 107  CO2 28 22 21*  GLUCOSE 141* 94 176*  BUN 19 22 18   CREATININE 0.87 0.77 0.75  CALCIUM 10.0 8.9 8.9   ------------------------------------------------------------------------------------------------------------------ No results for input(s): CHOL, HDL, LDLCALC, TRIG, CHOLHDL, LDLDIRECT in the last 72 hours.  Lab Results  Component Value Date   HGBA1C 5.3 04/17/2018   ------------------------------------------------------------------------------------------------------------------ No results for input(s): TSH, T4TOTAL, T3FREE, THYROIDAB in the last 72 hours.  Invalid input(s): FREET3 ------------------------------------------------------------------------------------------------------------------ No results for input(s): VITAMINB12, FOLATE, FERRITIN, TIBC, IRON, RETICCTPCT in the last 72 hours.  Coagulation profile Recent Labs  Lab 07/02/18 1159  INR 1.2    No results for input(s): DDIMER in the last 72 hours.  Cardiac Enzymes No results for input(s): CKMB, TROPONINI, MYOGLOBIN in the last 168 hours.  Invalid input(s): CK ------------------------------------------------------------------------------------------------------------------    Component Value Date/Time   BNP 132.0 (H) 03/16/2017 1925    Inpatient Medications  Scheduled Meds: . abiraterone acetate  500 mg Oral Daily  . bisacodyl  10 mg Rectal Once  . calcium-vitamin D  1 tablet Oral Daily  . cholecalciferol  1,000  Units Oral q morning - 10a  . diltiazem  30 mg Oral Q8H  . donepezil  10 mg Oral QHS  . feeding supplement (ENSURE ENLIVE)  237 mL Oral Daily  . fluticasone  2 spray Each Nare Daily  . furosemide  20 mg Oral Daily  . insulin aspart  0-5 Units Subcutaneous QHS  . insulin aspart  0-9 Units Subcutaneous TID WC  . lisinopril  20 mg Oral Daily  . mirtazapine  7.5 mg Oral QHS  . multivitamin with minerals  1 tablet Oral q morning - 10a  . pantoprazole  40 mg Oral Daily  . polycarbophil  625 mg Oral BID  . potassium chloride  20 mEq Oral Daily  . predniSONE  2.5 mg Oral BID  . sodium chloride flush  3 mL Intravenous Q12H   Continuous Infusions: . sodium chloride 250 mL (07/04/18 1558)  . ceFEPime (MAXIPIME) IV 2 g (07/05/18 1519)  . vancomycin 750 mg (07/05/18 1435)   PRN Meds:.sodium chloride, acetaminophen **OR** acetaminophen, guaiFENesin, haloperidol lactate, loperamide, ondansetron **OR** ondansetron (ZOFRAN) IV  Micro Results Recent Results (from the past 240 hour(s))  Blood culture (routine x 2)     Status: None (Preliminary result)   Collection Time: 07/02/18 12:07 PM  Result Value Ref Range Status   Specimen Description   Final    RIGHT ANTECUBITAL BOTTLES DRAWN AEROBIC AND ANAEROBIC   Special Requests Blood Culture adequate volume  Final  Culture   Final    NO GROWTH 3 DAYS Performed at [facility], [address], [place], [place] [address]    Report Status PENDING  Incomplete  Urine culture     Status: Abnormal   Collection Time: 07/02/18  2:51 PM  Result Value Ref Range Status   Specimen Description   Final    URINE, CLEAN CATCH Performed at [facility], [address], [place], [place] [address]    Special Requests   Final    NONE Performed at [facility], [address], [place], [place] [address]    Culture (A)  Final    <10,000 COLONIES/mL INSIGNIFICANT GROWTH Performed at [facility] [address], [place], [place] [address]    Report Status  07/04/2018 FINAL  Final  MRSA PCR Screening     Status: None   Collection Time: 07/03/18  9:40 AM  Result Value Ref Range Status   MRSA by PCR NEGATIVE NEGATIVE Final    Comment:        The GeneXpert MRSA Assay (FDA approved for NASAL specimens only), is one component of a comprehensive MRSA colonization surveillance program. It is not intended to diagnose MRSA infection nor to guide or monitor treatment for MRSA infections. Performed at [facility], [address], [place], [place] [address]     Radiology Reports Dg Chest 2 View  Result Date: 07/02/2018 CLINICAL DATA:  Cough.  Fever. EXAM: CHEST - 2 VIEW COMPARISON:  April 16, 2018 FINDINGS: New infiltrates seen in the right base. No other definitive infiltrates are noted. No pneumothorax. The cardiomediastinal silhouette is stable. IMPRESSION: New right basilar and middle lobe infiltrate consistent with pneumonia given history. Recommend treatment with short-term follow-up to ensure resolution. Electronically Signed   By: Dorise Bullion III M.D   On: 07/02/2018 13:13   Dg Swallowing Func-speech Pathology  Result Date: 07/04/2018 Objective Swallowing Evaluation: Type of Study: MBS-Modified Barium Swallow Study  Patient Details Name: [name] MRN: [id number] Date of Birth: Feb 14, 1924 Today's Date: 07/04/2018 Time: SLP Start Time (ACUTE ONLY): 7062 -SLP Stop Time (ACUTE ONLY): 1304 SLP Time Calculation (min) (ACUTE ONLY): 28 min Past Medical History: Past Medical History: Diagnosis Date . Acute bronchitis  . Arthritis  . Atrial fibrillation ([place])   Dr. Harl Bowie- [place] follows saw 11'14 . Bladder stones   tx. with oral meds and antibiotics, now surgery planned . Cataracts, both eyes   surgery planned May 2015 . Diabetes mellitus without complication ([place])   Type II . Dyspnea   with activity . Dysrhythmia   Afib . Family history of breast cancer  . Family history of colon cancer  . GERD (gastroesophageal reflux disease)  . GIST  (gastrointestinal stromal tumor), malignant ([place]) 2005  Gastrointestinal stromal tumor, that is GIST, small bowel, 4.5 cm, intermediate prognostic grade found on the PET scan in the small bowel, accounting for that small bowel activity in October 2005 with resection by Dr. Margot Chimes, thus far without recurrence.  . Hypertension  . NHL (non-Hodgkin's lymphoma) ([place]) 2005  Diffuse large B-cell lymphoma, clinically stage IIIA, CD20 positive, status post cervical lymph node biopsy 08/03/2003 on the left. PET scan was also positive in the spleen and small bowel region, but bone marrow aspiration and biopsy were negative. So he essentially had stage IIIAs. He received R-CHOP x6 cycles with CR established by PET scan criteria on 11/23/2003 with no evidence for relapse th . PAD (peripheral artery disease) ([place]) 08/27/2017 . Skin cancer  Basal cell- face,head, neck,  arms, legs, Back Past Surgical History: Past Surgical History: Procedure Laterality Date . AMPUTATION Left 06/29/2017  Procedure: AMPUTATION LEFT GREAT TOE;  Surgeon: [name], MD;  Location: [facility];  Service: Vascular;  Laterality: Left; . AMPUTATION Left 08/27/2017  Procedure: AMPUTATION Left SECOND TOE;  Surgeon: [name], MD;  Location: [facility];  Service: Vascular;  Laterality: Left; . CATARACT EXTRACTION Bilateral 2015 . CHOLECYSTECTOMY   . COLON RESECTION    small bowel . CYSTOSCOPY WITH LITHOLAPAXY N/A 08/10/2013  Procedure: CYSTOSCOPY WITH LITHOLAPAXY WITH [name];  Surgeon: [name], MD;  [facility];  Service: Urology;  Laterality: N/A; . ESOPHAGEAL DILATION N/A 02/27/2016  Procedure: ESOPHAGEAL DILATION;  Surgeon: [name], MD;  [facility];  Service: Endoscopy;  Laterality: N/A; . ESOPHAGOGASTRODUODENOSCOPY N/A 02/27/2016  Procedure: ESOPHAGOGASTRODUODENOSCOPY (EGD);  Surgeon: [name], MD;  [facility];  Service: Endoscopy;  Laterality: N/A;  12:00 . INCISION AND DRAINAGE PERIRECTAL  ABSCESS   . LOWER EXTREMITY ANGIOGRAPHY N/A 06/25/2017  Procedure: LOWER EXTREMITY ANGIOGRAPHY;  Surgeon: [name], MD;  Location: [facility];  Service: Cardiovascular;  Laterality: N/A; . LYMPH NODE DISSECTION Left   '05-neck . PORT-A-CATH REMOVAL   . PORTACATH PLACEMENT    insertion and removal -last chemotherapy 10 yrs ago . TRANSURETHRAL RESECTION OF PROSTATE N/A 08/10/2013  Procedure: TRANSURETHRAL RESECTION OF THE PROSTATE WITH GYRUS INSTRUMENTS;  Surgeon: [name], MD;  Location[facility];  Service: Urology;  Laterality: N/A; HPI: 85 year old male with metastatic prostate cancer, A. fib on Eliquis, type 2 diabetes mellitus, NHL completed cycle of R-CHOP without relapse, peripheral vascular disease, hypertension, chronic diarrhea and GERD with 2 prior hospitalization in the past 6 months with pneumonia and concern for aspiration brought from home with increased urinary frequency for past 5 days and 1 day of cough with chills, congestion and shortness of breath. Patient found to be in early sepsis secondary to UTI and lobar pneumonia. ST to complete BSE  No data recorded Assessment / Plan / Recommendation CHL IP CLINICAL IMPRESSIONS 07/04/2018 Clinical Impression Pt presents with normal oropharyngeal phase of swallow and suspected esophageal dysphagia characterized by min delayed oral transit with solids and piecemeal deglutition, swallow trigger at the level of the valleculae across textures and consistencies (except for with straw sips), and min decreased tongue base retraction resulting in trace base of tongue residuals post swallow which clear with repeat swallow. Pt had a single episode of trace penetration of thins when taking sequential straw sips of thin, however no aspiration observed. The barium tablet became delayed in the distal esophagus and never passed through the UES despite Pt being given extra solids, liquids, and extra time. Pt with history of esophageal dilation with Dr.  Laural Golden in the past and may benefit from an outpatient appointment with him. Pt was given puree bolus in attempt to clear the pill, however backflow of bolus was noted to the level of the cervical esophagus. Pt reportedly with several bouts of PNA in the past few months and question whether related to esophageal dysphagia. Reflux precautions and results of MBSS were reviewed with the Pt and his daughter. Recommend D3/mech soft and thin liquids, swallow two times for each sip if using a straw due to hand tremor, consume more frequent/smaller meals, and remain upright for at least 30 minutes following meals.  SLP Visit Diagnosis Dysphagia, oropharyngeal phase (R13.12) Attention and concentration deficit following -- Frontal lobe  and executive function deficit following -- Impact on safety and function Mild aspiration risk   CHL IP TREATMENT RECOMMENDATION 07/04/2018 Treatment Recommendations No treatment recommended at this time   Prognosis 07/04/2018 Prognosis for Safe Diet Advancement Good Barriers to Reach Goals Cognitive deficits Barriers/Prognosis Comment -- CHL IP DIET RECOMMENDATION 07/04/2018 SLP Diet Recommendations Dysphagia 3 (Mech soft) solids;Thin liquid Liquid Administration via Cup;Straw Medication Administration Whole meds with liquid Compensations Multiple dry swallows after each bite/sip Postural Changes Remain semi-upright after after feeds/meals (Comment);Seated upright at 90 degrees   CHL IP OTHER RECOMMENDATIONS 07/04/2018 Recommended Consults Consider esophageal assessment Oral Care Recommendations Oral care BID;Staff/trained caregiver to provide oral care Other Recommendations Clarify dietary restrictions   CHL IP FOLLOW UP RECOMMENDATIONS 07/04/2018 Follow up Recommendations None;24 hour supervision/assistance   CHL IP FREQUENCY AND DURATION 07/04/2018 Speech Therapy Frequency (ACUTE ONLY) min 1 x/week Treatment Duration 1 week      CHL IP ORAL PHASE 07/04/2018 Oral Phase Impaired Oral - Pudding  Teaspoon -- Oral - Pudding Cup -- Oral - Honey Teaspoon -- Oral - Honey Cup -- Oral - Nectar Teaspoon -- Oral - Nectar Cup -- Oral - Nectar Straw -- Oral - Thin Teaspoon -- Oral - Thin Cup -- Oral - Thin Straw -- Oral - Puree Piecemeal swallowing Oral - Mech Soft -- Oral - Regular Piecemeal swallowing;Delayed oral transit Oral - Multi-Consistency -- Oral - Pill -- Oral Phase - Comment --  CHL IP PHARYNGEAL PHASE 07/04/2018 Pharyngeal Phase Impaired Pharyngeal- Pudding Teaspoon -- Pharyngeal -- Pharyngeal- Pudding Cup -- Pharyngeal -- Pharyngeal- Honey Teaspoon -- Pharyngeal -- Pharyngeal- Honey Cup -- Pharyngeal -- Pharyngeal- Nectar Teaspoon -- Pharyngeal -- Pharyngeal- Nectar Cup -- Pharyngeal -- Pharyngeal- Nectar Straw -- Pharyngeal -- Pharyngeal- Thin Teaspoon Delayed swallow initiation-vallecula;Reduced tongue base retraction Pharyngeal -- Pharyngeal- Thin Cup Delayed swallow initiation-vallecula;Reduced tongue base retraction Pharyngeal -- Pharyngeal- Thin Straw Delayed swallow initiation-vallecula;Delayed swallow initiation-pyriform sinuses;Penetration/Aspiration during swallow;Reduced tongue base retraction;Pharyngeal residue - valleculae Pharyngeal Material does not enter airway;Material enters airway, remains ABOVE vocal cords then ejected out Pharyngeal- Puree Delayed swallow initiation-vallecula Pharyngeal -- Pharyngeal- Mechanical Soft -- Pharyngeal -- Pharyngeal- Regular Delayed swallow initiation-vallecula Pharyngeal -- Pharyngeal- Multi-consistency -- Pharyngeal -- Pharyngeal- Pill WFL Pharyngeal -- Pharyngeal Comment --  CHL IP CERVICAL ESOPHAGEAL PHASE 07/04/2018 Cervical Esophageal Phase Impaired Pudding Teaspoon -- Pudding Cup -- Honey Teaspoon -- Honey Cup -- Nectar Teaspoon -- Nectar Cup -- Nectar Straw -- Thin Teaspoon -- Thin Cup -- Thin Straw -- Puree -- Mechanical Soft -- Regular -- Multi-consistency -- Pill -- Cervical Esophageal Comment prominent cricopharyngeus, barium tablet remained in  distal esophagus, some backflow of bolus from distal esophagus to cervical esophagus Thank you, Genene Churn, [place] PORTER,DABNEY 07/04/2018, 5:28 PM               Time Spent in minutes  25   Kathie Dike M.D on 07/05/2018 at 9:09 PM  After 7pm go to [email or web address]    [facility] -  Office  [phone]

## 2018-07-05 NOTE — Consult Note (Addendum)
Referring Provider: Kathie Dike, MD Primary Care Physician:  Rosita Fire, MD Primary Gastroenterologist:  Dr. Laural Golden  Reason for Consultation:    Abnormal modified barium swallow.  HPI:   History provided by patient and his daughter [name] who is at bedside. Patient is 85 year old Caucasian male who presented to emergency room 3 days ago with sore throat cough and fever and he also had been confused.  Evaluation few days earlier revealed urinary tract infection but not yet on antibiotic.  Evaluation in emergency room revealed right lower and middle lobe infiltrate consistent with pneumonia.  Patient has been treated with cefepime and vancomycin and feels better as far as his fever and cough are concerned. Patient's daughter states that this is the fourth episode of pneumonia in the last 6 months.  There was concern for aspiration.  He therefore was evaluated by Ms. Genene Churn, CCC-SLP.  Patient underwent modified barium swallow yesterday.  Study revealed minimal delayed oral transit with solids and piecemeal documentation.  Mild decrease in tongue base retraction and trace penetration with thin liquids but no Terin aspiration.  Patient was then given barium pill which passed to distal esophagus but never made from the stomach. Since patient has a history of esophageal stricture GI consultation was requested.  Patient was placed on dysphagia 3 diet. Patient's daughter [name] tells me that he has difficulty with liquids.  When he eats Jell-O he just keeps chewing and forgets to swallow.  He also has difficulty with solids.  He has not had an episode of food impaction.  He denies heartburn nausea or vomiting.  He has had falls at home.  Recently he fell in the bathroom.  He apparently walked to the bathroom without walker.  He remains with loose stools.  He was seen in our office by mid [facility] NP December 2019 and advised to add fiber supplement.  His daughter states on doing so he was  having formed stools but then he decided to stop using the fiber supplement.  At the present time he is complaining of having an urge to have a bowel movement but not able to do so.  He denies melena rectal bleeding or abdominal pain. Patient was on warfarin until few days ago when he was switched to Eliquis. Patient underwent EGD with dilation in November 2017.  He was found to have distal esophageal stricture proximal to Schatzki's ring and he also had esophageal diverticulum.  Esophagus was dilated with disruption of ring and stricture.  This apparently provided him relief for a while. According to his daughter [name] he has gone downhill since November 2018.  He is married.  He and his wife live in [place]. Since he has not been doing well his daughter [name] has moved in from [name] to assist.   Past Medical History:  Diagnosis Date  . Acute bronchitis   . Arthritis   . Atrial fibrillation ([place])    Dr. Harl Bowie- [facility] follows saw 11'14  . Bladder stones    tx. with oral meds and antibiotics, now surgery planned  . Cataracts, both eyes    surgery planned May 2015  . Diabetes mellitus without complication ([place])    Type II  . Dyspnea    with activity  . Dysrhythmia    Afib  . Family history of breast cancer   . Family history of colon cancer   . GERD (gastroesophageal reflux disease)   . 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 Margot Chimes, thus far without recurrence.   . Hypertension   . NHL (non-Hodgkin's lymphoma) ([place]) 2005   Diffuse large B-cell lymphoma, clinically stage IIIA, CD20 positive, status post cervical lymph node biopsy 08/03/2003 on the left. PET scan was also positive in the spleen and small bowel region,  but bone marrow aspiration and biopsy were negative. So he essentially had stage IIIAs. He received R-CHOP x6 cycles with CR established by PET scan criteria on 11/23/2003 with no evidence for relapse th  . PAD (peripheral artery disease) ([place]) 08/27/2017  . Skin cancer    Basal cell- face,head, neck,  arms, legs, Back    Past Surgical History:  Procedure Laterality Date  . AMPUTATION Left 06/29/2017   Procedure: AMPUTATION LEFT GREAT TOE;  Surgeon: [name], MD;  Location: [place];  Service: Vascular;  Laterality: Left;  . AMPUTATION Left 08/27/2017   Procedure: AMPUTATION Left SECOND TOE;  Surgeon: [name], MD;  Location: [place];  Service: Vascular;  Laterality: Left;  . CATARACT EXTRACTION Bilateral 2015  . CHOLECYSTECTOMY    . COLON RESECTION     small bowel  . CYSTOSCOPY WITH LITHOLAPAXY N/A 08/10/2013   Procedure: CYSTOSCOPY WITH LITHOLAPAXY WITH [name];  Surgeon: [name], MD;  [facility];  Service: Urology;  Laterality: N/A;  . ESOPHAGEAL DILATION N/A 02/27/2016   Procedure: ESOPHAGEAL DILATION;  Surgeon: [name], MD;  [facility];  Service: Endoscopy;  Laterality: N/A;  . ESOPHAGOGASTRODUODENOSCOPY N/A 02/27/2016   Procedure: ESOPHAGOGASTRODUODENOSCOPY (EGD);  Surgeon: [name], MD;  [facility];  Service: Endoscopy;  Laterality: N/A;  12:00  . INCISION AND DRAINAGE PERIRECTAL ABSCESS    . LOWER EXTREMITY ANGIOGRAPHY N/A 06/25/2017   Procedure: LOWER EXTREMITY ANGIOGRAPHY;  Surgeon: [name], MD;  Location: [place] [facility];  Service: Cardiovascular;  Laterality: N/A;  . LYMPH NODE DISSECTION Left    '05-neck  . PORT-A-CATH REMOVAL    . PORTACATH PLACEMENT     insertion and removal -last chemotherapy 10 yrs ago  . TRANSURETHRAL RESECTION OF PROSTATE N/A 08/10/2013   Procedure: TRANSURETHRAL RESECTION OF THE PROSTATE WITH GYRUS INSTRUMENTS;  Surgeon: [name], MD;  [facility];  Service:  Urology;  Laterality: N/A;    Prior to Admission medications   Medication Sig Start Date End Date Taking? Authorizing Provider  abiraterone acetate (ZYTIGA) 250 MG tablet Take 2 tablets (500 mg total) by mouth daily. Take on an empty stomach 1 hour before or 2 hours after a meal 04/19/18  Yes [name], MD  acetaminophen (TYLENOL) 500 MG tablet Take 500-1,000 mg by mouth every 8 (eight) hours as needed for mild pain, fever or headache.    Yes [provider]  apixaban (ELIQUIS) 5 MG TABS tablet Take 5 mg by mouth 2 (two) times daily.   Yes [provider]  Calcium Carb-Cholecalciferol (CALCIUM 600+D) 600-800 MG-UNIT TABS Take 1 tablet by mouth daily.    Yes [provider]  Calcium Polycarbophil (FIBER-CAPS PO) Take 1 capsule by mouth 2 (two) times daily.   Yes [provider]  Cholecalciferol (VITAMIN D3) 1000 units CAPS Take 1,000 Units by mouth every morning.    Yes [provider]  Cranberry-Vitamin C-Vitamin E (CRANBERRY PLUS VITAMIN C)  4200-20-3 MG-MG-UNIT CAPS Take 1 capsule by mouth every morning.   Yes [provider]  Cyanocobalamin (VITAMIN B-12 IJ) Inject 1,000 mcg as directed every 30 (thirty) days.    Yes [provider]  diltiazem (CARDIZEM) 90 MG tablet TAKE 1 TABLET BY MOUTH TWICE DAILY -- **NEEDS APPOINTMENT FOR FURTHER REFILLS** 05/18/18  Yes Branch, Alphonse Guild, MD  diphenhydrAMINE-APAP, sleep, (TYLENOL PM EXTRA STRENGTH PO) Take 1 tablet by mouth at bedtime.    Yes [provider]  donepezil (ARICEPT) 10 MG tablet Take 10 mg by mouth daily.   Yes [provider]  feeding supplement, ENSURE ENLIVE, (ENSURE ENLIVE) LIQD Take 237 mLs by mouth daily. CHOCOLATE    Yes [provider]  furosemide (LASIX) 20 MG tablet Take 1 tablet (20 mg total) by mouth daily. 05/18/18 05/18/19 Yes BranchAlphonse Guild, MD  glipiZIDE (GLUCOTROL XL) 5 MG 24 hr tablet Take 5 mg by mouth daily with breakfast.   06/25/15  Yes [provider]  guaiFENesin (MUCINEX) 600 MG 12 hr tablet Take 600 mg by mouth 2 (two) times daily as needed for to loosen phlegm.   Yes [provider]  Leuprolide Acetate, 6 Month, (LUPRON DEPOT) 45 MG injection Inject 45 mg into the muscle every 6 (six) months.   Yes [provider]  lisinopril (PRINIVIL,ZESTRIL) 20 MG tablet Take 1 tablet (20 mg total) by mouth daily. 05/18/18  Yes BranchAlphonse Guild, MD  loperamide (IMODIUM) 2 MG capsule Take 1 capsule (2 mg total) by mouth as needed for diarrhea or loose stools. 04/18/18  Yes Oretha Milch D, MD  metFORMIN (GLUCOPHAGE) 850 MG tablet Take 850 mg by mouth 2 (two) times daily with a meal.  03/23/17  Yes [provider]  mirtazapine (REMERON) 7.5 MG tablet Take 7.5 mg by mouth at bedtime.   Yes [provider]  Multiple Vitamin (MULTIVITAMIN WITH MINERALS) TABS tablet Take 1 tablet by mouth every morning.   Yes [provider]  nystatin (MYCOSTATIN) powder Apply 1 g topically 4 (four) times daily as needed (fungus).    Yes [provider]  omeprazole (PRILOSEC) 40 MG capsule Take 40 mg by mouth every other day.  12/14/17  Yes [provider]  potassium chloride (K-DUR,KLOR-CON) 10 MEQ tablet Take 2 tablets (20 mEq total) by mouth daily. Patient taking differently: Take 10 mEq by mouth 2 (two) times daily.  06/21/17  Yes Higgs, Mathis Dad, MD  predniSONE (DELTASONE) 5 MG tablet Take 0.5 tablets (2.5 mg total) by mouth 2 (two) times daily. 05/09/18  Yes Lockamy, Randi L, NP-C  denosumab (XGEVA) 120 MG/1.7ML SOLN injection Inject 120 mg into the skin every 30 (thirty) days.     [provider]  fluticasone (FLONASE) 50 MCG/ACT nasal spray Place 2 sprays into both nostrils daily. 06/08/18   [provider]  warfarin (COUMADIN) 2.5 MG tablet TAKE 2 TO 3 TABLETS BY MOUTH ONCE DAILY AS DIRECTED BY  THE  ANTICOAGULATION  CLINIC Patient not taking: Reported on  07/02/2018 06/02/18   Arnoldo Lenis, MD    Current Facility-Administered Medications  Medication Dose Route Frequency Provider Last Rate Last Dose  . 0.9 %  sodium chloride infusion   Intravenous PRN Kathie Dike, MD 10 mL/hr at 07/04/18 1558 250 mL at 07/04/18 1558  . abiraterone acetate (ZYTIGA) tablet 500 mg  500 mg Oral Daily Dhungel, Nishant, MD   500 mg at 07/05/18 0849  . acetaminophen (TYLENOL) tablet 650 mg  650  mg Oral Q6H PRN Dhungel, Nishant, MD   650 mg at 07/04/18 2110   Or  . acetaminophen (TYLENOL) suppository 650 mg  650 mg Rectal Q6H PRN Dhungel, Nishant, MD      . bisacodyl (DULCOLAX) suppository 10 mg  10 mg Rectal Once Kimaria Struthers, Mechele Dawley, MD      . calcium-vitamin D (OSCAL WITH D) 500-200 MG-UNIT per tablet 1 tablet  1 tablet Oral Daily Dhungel, Nishant, MD   1 tablet at 07/05/18 0837  . ceFEPIme (MAXIPIME) 2 g in sodium chloride 0.9 % 100 mL IVPB  2 g Intravenous Q8H Memon, Jolaine Artist, MD 200 mL/hr at 07/05/18 0522 2 g at 07/05/18 0522  . cholecalciferol (VITAMIN D3) tablet 1,000 Units  1,000 Units Oral q morning - 10a Dhungel, Nishant, MD   1,000 Units at 07/05/18 0840  . diltiazem (CARDIZEM) tablet 30 mg  30 mg Oral Q8H Dhungel, Nishant, MD   30 mg at 07/05/18 0520  . donepezil (ARICEPT) tablet 10 mg  10 mg Oral QHS Dhungel, Nishant, MD   10 mg at 07/04/18 2110  . feeding supplement (ENSURE ENLIVE) (ENSURE ENLIVE) liquid 237 mL  237 mL Oral Daily Dhungel, Nishant, MD   237 mL at 07/04/18 1007  . fluticasone (FLONASE) 50 MCG/ACT nasal spray 2 spray  2 spray Each Nare Daily Dhungel, Nishant, MD   2 spray at 07/05/18 0841  . furosemide (LASIX) tablet 20 mg  20 mg Oral Daily Dhungel, Nishant, MD   20 mg at 07/05/18 0837  . guaiFENesin (MUCINEX) 12 hr tablet 600 mg  600 mg Oral BID PRN Dhungel, Nishant, MD      . haloperidol lactate (HALDOL) injection 0.5 mg  0.5 mg Intravenous Q6H PRN Dhungel, Nishant, MD      . insulin aspart (novoLOG) injection 0-5 Units  0-5 Units  Subcutaneous QHS Dhungel, Nishant, MD   2 Units at 07/04/18 2245  . insulin aspart (novoLOG) injection 0-9 Units  0-9 Units Subcutaneous TID WC Dhungel, Nishant, MD   2 Units at 07/05/18 0841  . lisinopril (PRINIVIL,ZESTRIL) tablet 20 mg  20 mg Oral Daily Dhungel, Nishant, MD   20 mg at 07/05/18 0840  . loperamide (IMODIUM) capsule 2 mg  2 mg Oral PRN Dhungel, Nishant, MD      . mirtazapine (REMERON) tablet 7.5 mg  7.5 mg Oral QHS Dhungel, Nishant, MD   7.5 mg at 07/04/18 2110  . multivitamin with minerals tablet 1 tablet  1 tablet Oral q morning - 10a Dhungel, Nishant, MD   1 tablet at 07/05/18 0838  . ondansetron (ZOFRAN) tablet 4 mg  4 mg Oral Q6H PRN Dhungel, Nishant, MD       Or  . ondansetron (ZOFRAN) injection 4 mg  4 mg Intravenous Q6H PRN Dhungel, Nishant, MD   4 mg at 07/03/18 0938  . pantoprazole (PROTONIX) EC tablet 40 mg  40 mg Oral Daily Dhungel, Nishant, MD   40 mg at 07/05/18 0839  . polycarbophil (FIBERCON) tablet 625 mg  625 mg Oral BID Dhungel, Nishant, MD   625 mg at 07/05/18 0839  . potassium chloride SA (K-DUR,KLOR-CON) CR tablet 20 mEq  20 mEq Oral Daily Dhungel, Nishant, MD   20 mEq at 07/05/18 0839  . predniSONE (DELTASONE) tablet 2.5 mg  2.5 mg Oral BID Dhungel, Nishant, MD   2.5 mg at 07/05/18 0838  . sodium chloride flush (NS) 0.9 % injection 3 mL  3 mL Intravenous Q12H Dhungel, Nishant, MD  3 mL at 07/04/18 2111  . vancomycin (VANCOCIN) IVPB 750 mg/150 ml premix  750 mg Intravenous Q12H Kathie Dike, MD 150 mL/hr at 07/05/18 0144 750 mg at 07/05/18 0144    Allergies as of 07/02/2018 - Review Complete 07/02/2018  Allergen Reaction Noted  . Tape Other (See Comments) 04/16/2018  . Augmentin [amoxicillin-pot clavulanate] Rash and Other (See Comments) 07/12/2017    Family History  Problem Relation Age of Onset  . Heart attack Mother        died in her 74s  . Other Father 43       pneumonia - died  . Breast cancer Sister        dx in her 41s-60s  . Colon  cancer Brother        dx in his 48s  . Cancer Sister        TAH/BSO due to "male cancer"  . Breast cancer Daughter 38  . Breast cancer Daughter 30       DCIS - ATM VUS on [employer] 83 gene panel in 2018  . Breast cancer Daughter 41       DCIS - Negative on [employer] MyRisk 25 gene apnel in 2015  . Thyroid cancer Daughter 88       Medullary and Papillary  . Thyroid nodules Grandchild     Social History   Socioeconomic History  . Marital status: Married    Spouse name: Not on file  . Number of children: Not on file  . Years of education: Not on file  . Highest education level: Not on file  Occupational History  . Occupation: retired    Fish farm manager: RETIRED    Comment: salesman  Social Needs  . Financial resource strain: Not on file  . Food insecurity:    Worry: Not on file    Inability: Not on file  . Transportation needs:    Medical: Not on file    Non-medical: Not on file  Tobacco Use  . Smoking status: Never Smoker  . Smokeless tobacco: Never Used  Substance and Sexual Activity  . Alcohol use: No  . Drug use: No  . Sexual activity: Not Currently  Lifestyle  . Physical activity:    Days per week: Not on file    Minutes per session: Not on file  . Stress: Not on file  Relationships  . Social connections:    Talks on phone: Not on file    Gets together: Not on file    Attends religious service: Not on file    Active member of club or organization: Not on file    Attends meetings of clubs or organizations: Not on file    Relationship status: Not on file  . Intimate partner violence:    Fear of current or ex partner: Not on file    Emotionally abused: Not on file    Physically abused: Not on file    Forced sexual activity: Not on file  Other Topics Concern  . Not on file  Social History Narrative  . Not on file    Review of Systems: See HPI, otherwise normal ROS  Physical Exam: Temp:  [97.1 F (36.2 C)-98.8 F (37.1 C)] 97.1 F (36.2 C) (03/17  0738) Foster Rate:  [75-90] 83 (03/17 0502) Resp:  [20-28] 24 (03/17 0502) BP: (149-168)/(73-95) 149/82 (03/17 0840) SpO2:  [100 %] 100 % (03/17 0502) Last BM Date: 07/04/18  Elderly Caucasian male who appears to be comfortable in bed. Conjunctiva  is pink.  Sclerae nonicteric. Oropharyngeal mucosa is somewhat dry.  He has 5 teeth and lower jaw and is edentulous in upper jaw. No neck masses or thyromegaly noted. Cardiac exam with irregular rhythm normal S1 and S2.  No murmur gallop noted. Auscultation of lungs reveal vesicular breath sounds with few rales at right base. Abdomen is symmetrical.  He has long right subcostal and small lower midline scars.  Bowel sounds are normal.  On palpation is soft and nontender with organomegaly or masses. No peripheral edema or clubbing noted.   Lab Results: Recent Labs    07/03/18 0651 07/04/18 0514 07/05/18 0457  WBC 17.3* 9.4 6.3  HGB 12.3* 10.2* 11.1*  HCT 35.2* 31.0* 31.7*  PLT 136* 113* 127*   BMET Recent Labs    07/02/18 1159 07/03/18 0651 07/05/18 0457  NA 139 139 136  K 3.5 3.2* 3.4*  CL 102 108 107  CO2 28 22 21*  GLUCOSE 141* 94 176*  BUN _0 CREATININE 0.87 0.77 0.75  CALCIUM 10.0 8.9 8.9   LFT No results for input(s): PROT, ALBUMIN, AST, ALT, ALKPHOS, BILITOT, BILIDIR, IBILI in the last 72 hours. PT/INR Recent Labs    07/02/18 1159  LABPROT 14.9  INR 1.2   Hepatitis Panel No results for input(s): HEPBSAG, HCVAB, HEPAIGM, HEPBIGM in the last 72 hours.  Studies/Results: Dg Swallowing Func-speech Pathology  Result Date: 07/04/2018 Objective Swallowing Evaluation: Type of Study: MBS-Modified Barium Swallow Study  Patient Details Name: [name] MRN: [id number] Date of Birth: [birth year] Today's Date: 07/04/2018 Time: SLP Start Time (ACUTE ONLY): 1607 -SLP Stop Time (ACUTE ONLY): 1304 SLP Time Calculation (min) (ACUTE ONLY): 28 min Past Medical History: Past Medical History: Diagnosis Date . Acute bronchitis   . Arthritis  . Atrial fibrillation ([place])   Dr. Harl Bowie- [facility] follows saw 11'14 . Bladder stones   tx. with oral meds and antibiotics, now surgery planned . Cataracts, both eyes   surgery planned May 2015 . Diabetes mellitus without complication ([place])   Type II . Dyspnea   with activity . Dysrhythmia   Afib . Family history of breast cancer  . Family history of colon cancer  . GERD (gastroesophageal reflux disease)  . 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 Margot Chimes, thus far without recurrence.  . Hypertension  . NHL (non-Hodgkin's lymphoma) ([place]) 2005  Diffuse large B-cell lymphoma, clinically stage IIIA, CD20 positive, status post cervical lymph node biopsy 08/03/2003 on the left. PET scan was also positive in the spleen and small bowel region, but bone marrow aspiration and biopsy were negative. So he essentially had stage IIIAs. He received R-CHOP x6 cycles with CR established by PET scan criteria on 11/23/2003 with no evidence for relapse th . PAD (peripheral artery disease) ([place]) 08/27/2017 . Skin cancer   Basal cell- face,head, neck,  arms, legs, Back Past Surgical History: Past Surgical History: Procedure Laterality Date . AMPUTATION Left 06/29/2017  Procedure: AMPUTATION LEFT GREAT TOE;  Surgeon: [name], MD;  Location: [place];  Service: Vascular;  Laterality: Left; . AMPUTATION Left 08/27/2017  Procedure: AMPUTATION Left SECOND TOE;  Surgeon: [name], MD;  Location: [place];  Service: Vascular;  Laterality: Left; . CATARACT EXTRACTION Bilateral 2015 . CHOLECYSTECTOMY   . COLON  RESECTION    small bowel . CYSTOSCOPY WITH LITHOLAPAXY N/A 08/10/2013  Procedure: CYSTOSCOPY WITH LITHOLAPAXY WITH [name];  Surgeon: [name], MD;  [facility];   Service: Urology;  Laterality: N/A; . ESOPHAGEAL DILATION N/A 02/27/2016  Procedure: ESOPHAGEAL DILATION;  Surgeon: [name], MD;  [facility];  Service: Endoscopy;  Laterality: N/A; . ESOPHAGOGASTRODUODENOSCOPY N/A 02/27/2016  Procedure: ESOPHAGOGASTRODUODENOSCOPY (EGD);  Surgeon: [name], MD;  [facility];  Service: Endoscopy;  Laterality: N/A;  12:00 . INCISION AND DRAINAGE PERIRECTAL ABSCESS   . LOWER EXTREMITY ANGIOGRAPHY N/A 06/25/2017  Procedure: LOWER EXTREMITY ANGIOGRAPHY;  Surgeon: [name], MD;  Location: [place] [facility];  Service: Cardiovascular;  Laterality: N/A; . LYMPH NODE DISSECTION Left   '05-neck . PORT-A-CATH REMOVAL   . PORTACATH PLACEMENT    insertion and removal -last chemotherapy 10 yrs ago . TRANSURETHRAL RESECTION OF PROSTATE N/A 08/10/2013  Procedure: TRANSURETHRAL RESECTION OF THE PROSTATE WITH GYRUS INSTRUMENTS;  Surgeon: [name], MD;  [facility];  Service: Urology;  Laterality: N/A; HPI: 85 year old male with metastatic prostate cancer, A. fib on Eliquis, type 2 diabetes mellitus, NHL completed cycle of R-CHOP without relapse, peripheral vascular disease, hypertension, chronic diarrhea and GERD with 2 prior hospitalization in the past 6 months with pneumonia and concern for aspiration brought from home with increased urinary frequency for past 5 days and 1 day of cough with chills, congestion and shortness of breath. Patient found to be in early sepsis secondary to UTI and lobar pneumonia. ST to complete BSE  No data recorded Assessment / Plan / Recommendation CHL IP CLINICAL IMPRESSIONS 07/04/2018 Clinical Impression Pt presents with normal oropharyngeal phase of swallow and suspected esophageal dysphagia characterized by min delayed oral transit with solids and piecemeal deglutition, swallow trigger at the level of the valleculae across textures and consistencies (except for with straw sips), and min decreased tongue base  retraction resulting in trace base of tongue residuals post swallow which clear with repeat swallow. Pt had a single episode of trace penetration of thins when taking sequential straw sips of thin, however no aspiration observed. The barium tablet became delayed in the distal esophagus and never passed through the UES despite Pt being given extra solids, liquids, and extra time. Pt with history of esophageal dilation with Dr. Laural Golden in the past and may benefit from an outpatient appointment with him. Pt was given puree bolus in attempt to clear the pill, however backflow of bolus was noted to the level of the cervical esophagus. Pt reportedly with several bouts of PNA in the past few months and question whether related to esophageal dysphagia. Reflux precautions and results of MBSS were reviewed with the Pt and his daughter. Recommend D3/mech soft and thin liquids, swallow two times for each sip if using a straw due to hand tremor, consume more frequent/smaller meals, and remain upright for at least 30 minutes following meals.  SLP Visit Diagnosis Dysphagia, oropharyngeal phase (R13.12) Attention and concentration deficit following -- Frontal lobe and executive function deficit following -- Impact on safety and function Mild aspiration risk   CHL IP TREATMENT RECOMMENDATION 07/04/2018 Treatment Recommendations No treatment recommended at this time   Prognosis 07/04/2018 Prognosis for Safe Diet Advancement Good Barriers to Reach Goals Cognitive deficits Barriers/Prognosis Comment -- CHL IP DIET RECOMMENDATION 07/04/2018 SLP Diet Recommendations Dysphagia 3 (Mech soft) solids;Thin liquid Liquid Administration via Cup;Straw Medication Administration Whole meds with liquid Compensations Multiple dry swallows after each bite/sip Postural  Changes Remain semi-upright after after feeds/meals (Comment);Seated upright at 90 degrees   CHL IP OTHER RECOMMENDATIONS 07/04/2018 Recommended Consults Consider esophageal assessment Oral  Care Recommendations Oral care BID;Staff/trained caregiver to provide oral care Other Recommendations Clarify dietary restrictions   CHL IP FOLLOW UP RECOMMENDATIONS 07/04/2018 Follow up Recommendations None;24 hour supervision/assistance   CHL IP FREQUENCY AND DURATION 07/04/2018 Speech Therapy Frequency (ACUTE ONLY) min 1 x/week Treatment Duration 1 week      CHL IP ORAL PHASE 07/04/2018 Oral Phase Impaired Oral - Pudding Teaspoon -- Oral - Pudding Cup -- Oral - Honey Teaspoon -- Oral - Honey Cup -- Oral - Nectar Teaspoon -- Oral - Nectar Cup -- Oral - Nectar Straw -- Oral - Thin Teaspoon -- Oral - Thin Cup -- Oral - Thin Straw -- Oral - Puree Piecemeal swallowing Oral - Mech Soft -- Oral - Regular Piecemeal swallowing;Delayed oral transit Oral - Multi-Consistency -- Oral - Pill -- Oral Phase - Comment --  CHL IP PHARYNGEAL PHASE 07/04/2018 Pharyngeal Phase Impaired Pharyngeal- Pudding Teaspoon -- Pharyngeal -- Pharyngeal- Pudding Cup -- Pharyngeal -- Pharyngeal- Honey Teaspoon -- Pharyngeal -- Pharyngeal- Honey Cup -- Pharyngeal -- Pharyngeal- Nectar Teaspoon -- Pharyngeal -- Pharyngeal- Nectar Cup -- Pharyngeal -- Pharyngeal- Nectar Straw -- Pharyngeal -- Pharyngeal- Thin Teaspoon Delayed swallow initiation-vallecula;Reduced tongue base retraction Pharyngeal -- Pharyngeal- Thin Cup Delayed swallow initiation-vallecula;Reduced tongue base retraction Pharyngeal -- Pharyngeal- Thin Straw Delayed swallow initiation-vallecula;Delayed swallow initiation-pyriform sinuses;Penetration/Aspiration during swallow;Reduced tongue base retraction;Pharyngeal residue - valleculae Pharyngeal Material does not enter airway;Material enters airway, remains ABOVE vocal cords then ejected out Pharyngeal- Puree Delayed swallow initiation-vallecula Pharyngeal -- Pharyngeal- Mechanical Soft -- Pharyngeal -- Pharyngeal- Regular Delayed swallow initiation-vallecula Pharyngeal -- Pharyngeal- Multi-consistency -- Pharyngeal -- Pharyngeal- Pill  WFL Pharyngeal -- Pharyngeal Comment --  CHL IP CERVICAL ESOPHAGEAL PHASE 07/04/2018 Cervical Esophageal Phase Impaired Pudding Teaspoon -- Pudding Cup -- Honey Teaspoon -- Honey Cup -- Nectar Teaspoon -- Nectar Cup -- Nectar Straw -- Thin Teaspoon -- Thin Cup -- Thin Straw -- Puree -- Mechanical Soft -- Regular -- Multi-consistency -- Pill -- Cervical Esophageal Comment prominent cricopharyngeus, barium tablet remained in distal esophagus, some backflow of bolus from distal esophagus to cervical esophagus Thank you, Genene Churn, [place] 07/04/2018, 5:28 PM              I have reviewed patient's chest film from admission as well as modified barium swallow alongside his daughter Ms. Goolsby.  Assessment;  Patient is 85 year old Caucasian male who was hospitalized 3 days ago for community-acquired pneumonia which has been as fourth respiratory tract infection within the last 6 months.  There has been concern for aspiration.  Therefore he underwent modified barium swallow which revealed mild abnormality to phase 1 and phase 2 of his degradation but barium pill got obstructed at distal esophagus concerning for stricture. Patient has history of distal esophageal stricture as well as Schatzki's ring and I believe he also has esophageal motility disorder.  Since barium pill failed to pass in the stomach he would benefit from EGD and esophageal dilation.  However I am concerned that his oropharyngeal dysphagia is worse than we recognize on modified study.  It remains to be seen if esophageal dilation would prevent future episodes of pneumonia. Patient is on Eliquis which will need to be discontinued for at least 48 hours before EGD/dilation can be performed.  I have discussed with Dr. Roderic Palau and he is in agreement with stopping anticoagulant.  Anemia appears to be  chronic.  No evidence of acute GI bleed.  Diarrhea and urgency.  Need to rule out fecal impaction.  If indeed he has  diarrhea will need to rule out C. difficile colitis.  Recommendations;  Hold Eliquis. Esophagogastroduodenoscopy with esophageal dilation on 07/07/2018. Dulcolax suppository x1 per rectum today.   LOS: 3 days      07/05/2018, 8:49 AM

## 2018-07-06 DIAGNOSIS — K224 Dyskinesia of esophagus: Secondary | ICD-10-CM

## 2018-07-06 DIAGNOSIS — K449 Diaphragmatic hernia without obstruction or gangrene: Secondary | ICD-10-CM

## 2018-07-06 DIAGNOSIS — K222 Esophageal obstruction: Secondary | ICD-10-CM

## 2018-07-06 DIAGNOSIS — R1314 Dysphagia, pharyngoesophageal phase: Secondary | ICD-10-CM

## 2018-07-06 LAB — BASIC METABOLIC PANEL
Anion gap: 8 (ref 5–15)
BUN: 13 mg/dL (ref 8–23)
CO2: 24 mmol/L (ref 22–32)
Calcium: 8.6 mg/dL — ABNORMAL LOW (ref 8.9–10.3)
Chloride: 105 mmol/L (ref 98–111)
Creatinine, Ser: 0.64 mg/dL (ref 0.61–1.24)
GFR calc Af Amer: >60 mL/min (ref >60)
GFR calc non Af Amer: >60 mL/min (ref >60)
Glucose, Bld: 202 mg/dL — ABNORMAL HIGH (ref 70–99)
Potassium: 3.1 mmol/L — ABNORMAL LOW (ref 3.5–5.1)
Sodium: 137 mmol/L (ref 135–145)

## 2018-07-06 LAB — CBC
HCT: 29.7 % — ABNORMAL LOW (ref 39.0–52.0)
Hemoglobin: 10.3 g/dL — ABNORMAL LOW (ref 13.0–17.0)
MCH: 31.6 pg (ref 26.0–34.0)
MCHC: 34.7 g/dL (ref 30.0–36.0)
MCV: 91.1 fL (ref 80.0–100.0)
Platelets: 139 10*3/uL — ABNORMAL LOW (ref 150–400)
RBC: 3.26 MIL/uL — ABNORMAL LOW (ref 4.22–5.81)
RDW: 12.8 % (ref 11.5–15.5)
WBC: 5.8 10*3/uL (ref 4.0–10.5)
nRBC: 0 % (ref 0.0–0.2)

## 2018-07-06 LAB — GLUCOSE, CAPILLARY
Glucose-Capillary: 176 mg/dL — ABNORMAL HIGH (ref 70–99)
Glucose-Capillary: 153 mg/dL — ABNORMAL HIGH (ref 70–99)
Glucose-Capillary: 199 mg/dL — ABNORMAL HIGH (ref 70–99)
Glucose-Capillary: 172 mg/dL — ABNORMAL HIGH (ref 70–99)

## 2018-07-06 MED ORDER — VANCOMYCIN HCL 500 MG IV SOLR
500.0000 mg | Freq: Two times a day (BID) | INTRAVENOUS | Status: DC
  Administered 2018-07-06 – 2018-07-08 (×3): 500 mg via INTRAVENOUS
  Filled 2018-07-06 (×11): qty 500

## 2018-07-06 MED ORDER — POTASSIUM CHLORIDE 10 MEQ/100ML IV SOLN
10.0000 meq | INTRAVENOUS | Status: AC
  Administered 2018-07-06 (×4): 10 meq via INTRAVENOUS
  Filled 2018-07-06 (×4): qty 100

## 2018-07-06 MED ORDER — IPRATROPIUM-ALBUTEROL 0.5-2.5 (3) MG/3ML IN SOLN
3.0000 mL | Freq: Four times a day (QID) | RESPIRATORY_TRACT | Status: DC
  Administered 2018-07-06: 3 mL via RESPIRATORY_TRACT
  Filled 2018-07-06: qty 3

## 2018-07-06 MED ORDER — ALBUTEROL SULFATE (2.5 MG/3ML) 0.083% IN NEBU: 2.5000 mg | INHALATION_SOLUTION | Freq: Four times a day (QID) | RESPIRATORY_TRACT | Status: DC | PRN

## 2018-07-06 MED ORDER — IPRATROPIUM-ALBUTEROL 0.5-2.5 (3) MG/3ML IN SOLN
3.0000 mL | Freq: Two times a day (BID) | RESPIRATORY_TRACT | Status: DC
  Administered 2018-07-07 – 2018-07-08 (×3): 3 mL via RESPIRATORY_TRACT
  Filled 2018-07-06 (×3): qty 3

## 2018-07-06 NOTE — Plan of Care (Signed)
  Problem: Acute Rehab PT Goals(only PT should resolve) Goal: Pt Will Go Supine/Side To Sit Outcome: Progressing Flowsheets (Taken 07/06/2018 1427) Pt will go Supine/Side to Sit: with modified independence Goal: Patient Will Transfer Sit To/From Stand Outcome: Progressing Flowsheets (Taken 07/06/2018 1427) Patient will transfer sit to/from stand: with supervision Goal: Pt Will Transfer Bed To Chair/Chair To Bed Outcome: Progressing Flowsheets (Taken 07/06/2018 1427) Pt will Transfer Bed to Chair/Chair to Bed: with supervision Goal: Pt Will Ambulate Outcome: Progressing Flowsheets (Taken 07/06/2018 1427) Pt will Ambulate: with min guard assist; > 125 feet; with rolling walker   2:27 PM, 07/06/18 Lonell Grandchild, MPT Physical Therapist with [facility] [phone] office [phone] mobile phone

## 2018-07-06 NOTE — NC FL2 (Signed)
[place] LEVEL OF CARE SCREENING TOOL     IDENTIFICATION  Patient Name: [name] Birthdate: 03-05-1924 Sex: male Admission Date (Current Location): 07/02/2018  [facility] and [place] Number:  [employer] and Address:  [place] [address] [place]      Provider Number: [id number]  Attending Physician Name and Address:  Kathie Dike, MD  Relative Name and Phone Number:  [name] Daughter [phone]     Current Level of Care: Hospital Recommended Level of Care: [place] Prior Approval Number:    Date Approved/Denied: 02/02/18 PASRR Number: [id number]  Discharge Plan: SNF    Current Diagnoses: Patient Active Problem List   Diagnosis Date Noted  . Sepsis secondary to UTI ([place]) 07/02/2018  . HCAP (healthcare-associated pneumonia) 07/02/2018  . CAP (community acquired pneumonia) 04/16/2018  . Dementia without behavioral disturbance ([place]) 04/16/2018  . Dehydration 04/16/2018  . Acute metabolic encephalopathy 64/68/0321  . Encephalopathy acute 01/31/2018  . Aspiration pneumonia ([place]) 01/31/2018  . Diarrhea 11/25/2017  . Acute lower UTI 11/25/2017  . PAD (peripheral artery disease) ([place]) 08/27/2017  . Great toe amputation status, left 07/30/2017  . Osteomyelitis of toe of left foot ([place]) 06/29/2017  . Acute bronchitis with bronchospasm 03/16/2017  . Type 2 diabetes mellitus ([place]) 03/16/2017  . Hypomagnesemia 03/16/2017  . 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 Postural dizziness with presyncope 02/19/2017  . Polyneuropathy 02/19/2017  . Numbness 02/19/2017  . Urinary frequency 02/19/2017  . Genetic testing 02/09/2017  . Family history of breast cancer   . Family history of colon cancer   . Other dysphagia 01/21/2016  . Bone metastases ([place]) 08/21/2015  . Prostate cancer ([place]) 09/08/2013  . Hypertrophy of prostate with urinary obstruction and other lower urinary tract  symptoms (LUTS) 08/10/2013  . Encounter for therapeutic drug monitoring 06/12/2013  . NHL (non-Hodgkin's lymphoma) ([place]) 08/18/2011  . 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 Long term (current) use of anticoagulants 07/18/2010  . Essential hypertension 10/04/2008  . ATRIAL FIBRILLATION 10/04/2008    Orientation RESPIRATION BLADDER Height & Weight     Self, Time, Situation, Place  Normal External catheter Weight: 70.6 kg Height:  5\' 10"  (177.8 cm)  BEHAVIORAL SYMPTOMS/MOOD NEUROLOGICAL BOWEL NUTRITION STATUS      Continent Diet(dysphagia 3 diet)  AMBULATORY STATUS COMMUNICATION OF NEEDS Skin   Extensive Assist Verbally                         Personal Care Assistance Level of Assistance  Bathing, Feeding, Dressing Bathing Assistance: Limited assistance Feeding assistance: Limited assistance Dressing Assistance: Limited assistance     Functional Limitations Info  Sight, Hearing, Speech Sight Info: Adequate Hearing Info: Impaired(hard of hearing) Speech Info: Adequate    SPECIAL CARE FACTORS FREQUENCY  PT (By licensed PT)     PT Frequency: 5x/week              Contractures Contractures Info: Not present    Additional Factors Info  Psychotropic Code Status Info: DNR Allergies Info: tape, augmentin Psychotropic Info: Remeron         Current Medications (07/06/2018):  This is the current hospital active medication list Current Facility-Administered Medications  Medication Dose Route Frequency Provider Last Rate Last Dose  . 0.9 %  sodium chloride infusion   Intravenous PRN Kathie Dike, MD 10 mL/hr at 07/06/18 0315 500 mL at 07/06/18 0315  . abiraterone  acetate (ZYTIGA) tablet 500 mg  500 mg Oral Daily Dhungel, Nishant, MD   500 mg at 07/06/18 0934  . acetaminophen (TYLENOL) tablet 650 mg  650 mg Oral Q6H PRN Dhungel, Nishant, MD   650 mg at 07/05/18 2223   Or  . acetaminophen (TYLENOL) suppository 650 mg  650 mg Rectal Q6H  PRN Dhungel, Nishant, MD      . bisacodyl (DULCOLAX) suppository 10 mg  10 mg Rectal Once Rehman, Mechele Dawley, MD      . calcium-vitamin D (OSCAL WITH D) 500-200 MG-UNIT per tablet 1 tablet  1 tablet Oral Daily Dhungel, Nishant, MD   1 tablet at 07/06/18 0924  . ceFEPIme (MAXIPIME) 2 g in sodium chloride 0.9 % 100 mL IVPB  2 g Intravenous Q8H Kathie Dike, MD   Stopped at 07/06/18 1358  . cholecalciferol (VITAMIN D3) tablet 1,000 Units  1,000 Units Oral q morning - 10a Dhungel, Nishant, MD   1,000 Units at 07/06/18 0924  . diltiazem (CARDIZEM) tablet 30 mg  30 mg Oral Q8H Dhungel, Nishant, MD   30 mg at 07/06/18 1328  . donepezil (ARICEPT) tablet 10 mg  10 mg Oral QHS Dhungel, Nishant, MD   10 mg at 07/05/18 2223  . feeding supplement (ENSURE ENLIVE) (ENSURE ENLIVE) liquid 237 mL  237 mL Oral Daily Dhungel, Nishant, MD   237 mL at 07/06/18 0933  . fluticasone (FLONASE) 50 MCG/ACT nasal spray 2 spray  2 spray Each Nare Daily Dhungel, Nishant, MD   2 spray at 07/06/18 0933  . furosemide (LASIX) tablet 20 mg  20 mg Oral Daily Dhungel, Nishant, MD   20 mg at 07/06/18 0924  . guaiFENesin (MUCINEX) 12 hr tablet 600 mg  600 mg Oral BID PRN Dhungel, Nishant, MD      . haloperidol lactate (HALDOL) injection 0.5 mg  0.5 mg Intravenous Q6H PRN Dhungel, Nishant, MD      . insulin aspart (novoLOG) injection 0-5 Units  0-5 Units Subcutaneous QHS Dhungel, Nishant, MD   2 Units at 07/04/18 2245  . insulin aspart (novoLOG) injection 0-9 Units  0-9 Units Subcutaneous TID WC Dhungel, Nishant, MD   2 Units at 07/06/18 1328  . lisinopril (PRINIVIL,ZESTRIL) tablet 20 mg  20 mg Oral Daily Dhungel, Nishant, MD   20 mg at 07/06/18 0924  . loperamide (IMODIUM) capsule 2 mg  2 mg Oral PRN Dhungel, Nishant, MD      . mirtazapine (REMERON) tablet 7.5 mg  7.5 mg Oral QHS Dhungel, Nishant, MD   7.5 mg at 07/05/18 2225  . multivitamin with minerals tablet 1 tablet  1 tablet Oral q morning - 10a Dhungel, Nishant, MD   1 tablet at  07/06/18 0924  . ondansetron (ZOFRAN) tablet 4 mg  4 mg Oral Q6H PRN Dhungel, Nishant, MD       Or  . ondansetron (ZOFRAN) injection 4 mg  4 mg Intravenous Q6H PRN Dhungel, Nishant, MD   4 mg at 07/03/18 0938  . pantoprazole (PROTONIX) EC tablet 40 mg  40 mg Oral Daily Dhungel, Nishant, MD   40 mg at 07/06/18 0923  . polycarbophil (FIBERCON) tablet 625 mg  625 mg Oral BID Dhungel, Nishant, MD   625 mg at 07/06/18 0924  . potassium chloride SA (K-DUR,KLOR-CON) CR tablet 20 mEq  20 mEq Oral Daily Dhungel, Nishant, MD   20 mEq at 07/06/18 0924  . predniSONE (DELTASONE) tablet 2.5 mg  2.5 mg Oral BID Dhungel, Nishant, MD  2.5 mg at 07/06/18 0924  . sodium chloride flush (NS) 0.9 % injection 3 mL  3 mL Intravenous Q12H Dhungel, Nishant, MD   3 mL at 07/06/18 0934  . vancomycin (VANCOCIN) 500 mg in sodium chloride 0.9 % 100 mL IVPB  500 mg Intravenous Q12H Kathie Dike, MD 100 mL/hr at 07/06/18 1537 500 mg at 07/06/18 1537     Discharge Medications: Please see discharge summary for a list of discharge medications.  Relevant Imaging Results:  Relevant Lab Results:   Additional Information SSN [id number]  Velicia Dejager, Chauncey [place], RN

## 2018-07-06 NOTE — TOC Progression Note (Signed)
Transition of Care [facility]) - Progression Note    Patient Details  Name: [name] MRN: [id number] Date of Birth: 06-28-1923  Transition [facility] CM/SW Contact  Syon Tews, Chauncey [place], RN Phone Number: 07/06/2018, 11:08 AM  Clinical Narrative:   Met with patient and daughter to discuss SNF vs HH recommendation. She reports that patient had been referred to SNF in the past but was declined by some facility due to a cancer medication but that [facility] was willing to accept patient. Daughter reports that family is leaning towards taking patient home but would like CM to call [facility] to see if they would be willing to take patient on this medication.  Call to [facility], who will call back with answer.   Patient is active with [facility].   Discussed [facility] with daughter. She is agreeable to referral to them to be assessed for long term care needs. [facility] has already finished enrolling for the month of April, so enrollment/consulation would not be until May at the earliest. Daughter aware.     Expected Discharge Plan: [place]    Expected Discharge Plan and Services Expected Discharge Plan: [place]   Post Acute Care Choice: Resumption of Svcs/PTA Provider                             Social Determinants of Health (SDOH) Interventions    Readmission Risk Interventions 30 Day Unplanned Readmission Risk Score     ED to Hosp-Admission (Current) from 07/02/2018 in [place]  30 Day Unplanned Readmission Risk Score (%)  48 Filed at 07/06/2018 0801     This score is the patient's risk of an unplanned readmission within 30 days of being discharged (0 -100%). The score is based on dignosis, age, lab data, medications, orders, and past utilization.   Low:  0-14.9   Medium: 15-21.9   High: 22-29.9   Extreme: 30 and above       Readmission Risk Prevention Plan 07/04/2018  Transportation  Screening Complete  Medication Review Press photographer) Complete  HRI or Home Care Consult Complete  SW Recovery Care/Counseling Consult Complete  Some recent data might be hidden

## 2018-07-06 NOTE — Progress Notes (Signed)
Patient's daughter states that he ate a good breakfast which was late since he was busy with physical therapy.  He did not eat much at lunchtime.  He is getting ready to eat supper. Patient says he is ready to get on with EGD and dilation. Patient will procedure in a.m.

## 2018-07-06 NOTE — Care Management Important Message (Signed)
Important Message  Patient Details  Name: [name] MRN: [id number] Date of Birth: 1924-01-24   Medicare Important Message Given:  Yes    Tommy Medal 07/06/2018, 1:53 PM

## 2018-07-06 NOTE — Progress Notes (Signed)
PROGRESS NOTE                                                                                                                                                                                                             Patient Demographics:    [name], is a 85 y.o. male, DOB - 09-16-1923, FYB:[id number]  Admit date - 07/02/2018   Admitting Physician No admitting provider for patient encounter.  Outpatient Primary MD for the patient is Rosita Fire, MD  LOS - 4  Outpatient Specialists:  Chief Complaint  Patient presents with  . Cough  . Fever       Brief Narrative   85 year old male with metastatic prostate cancer, A. fib on Eliquis, type 2 diabetes mellitus, NHL completed cycle of R-CHOP without relapse, peripheral vascular disease, hypertension, chronic diarrhea and GERD with 2 prior hospitalization in the past 6 months with pneumonia and concern for aspiration brought from home with increased urinary frequency for past 5 days and 1 day of cough with chills, congestion and shortness of breath. Patient found to be in early sepsis secondary to UTI and lobar pneumonia.   Subjective:   Has occasional cough.  Shortness of breath is better.  Overall he is feeling improved.   Assessment  & Plan :   Sepsis ([place]) secondary to UTI and healthcare associated pneumonia Sepsis pathway initiated in the ED.  Currently resolved.  Blood cultures show no growth.  Leukocytosis resolved.  Active Problems: UTI secondary to Enterococcus faecalis Culture sent on 3/9 (by home health) for dysuria growing 10-50 K organism, sensitive to vancomycin, nitrofurantoin and ampicillin as per daughter.  Continue empiric vancomycin.  Follow urine culture sent in the ED.    HCAP (healthcare-associated pneumonia) Recurrent pneumonia in past 6 months (third episode) with aspiration risk.  Last hospitalized in December 2019. Empiric IV  vancomycin and cefepime.    Influenza panel was found to be negative.  Urinary antigens were ordered, but not sent.  Blood cultures are shown no growth. Continue antitussives. Daughter reports that patient has been followed by  swallow nurse at home.    He was seen by speech therapy and had MBSS performed. It was felt that symptoms may be related to esophageal issues. He has required esophageal dilatation in the past.  Seen by GI with plans for endoscopy on  3/19  Dementia with acute delirium Secondary to acute illness.  Continue Aricept.  Has had sundowning during previous hospital stay.  Monitor closely.   Avoid benzos and narcotics.  Overall mental status is better  Paroxysmal A. fib Currently rate controlled.    Continue Cardizem.  Eliquis on hold for endoscopy    NHL (non-Hodgkin's lymphoma) (HCC)   GIST (gastrointestinal stromal tumor), malignant ([place])   Prostate cancer ([place]) Follows with Dr. Delton Coombes.  Has bony metastasis.  Continue outpatient prednisone and Zytiga    Type 2 diabetes mellitus (HCC) CBG stable.  Holding oral hypoglycemic and monitor on sliding scale coverage.  Hypokalemia Replenished   Code Status : DNR  Family Communication  : daughter at bedside  Disposition Plan  : PT evaluation and GI evaluation needed prior to disposition  Barriers For Discharge : Active symptoms  Consults  : Gastroenterology  Procedures  : None  DVT Prophylaxis  : SCDs  Lab Results  Component Value Date   PLT 139 (L) 07/06/2018    Antibiotics  :    Anti-infectives (From admission, onward)   Start     Dose/Rate Route Frequency Ordered Stop   07/06/18 1400  vancomycin (VANCOCIN) 500 mg in sodium chloride 0.9 % 100 mL IVPB     500 mg 100 mL/hr over 60 Minutes Intravenous Every 12 hours 07/06/18 1046     07/04/18 1400  ceFEPIme (MAXIPIME) 2 g in sodium chloride 0.9 % 100 mL IVPB     2 g 200 mL/hr over 30 Minutes Intravenous Every 8 hours 07/04/18 1112     07/04/18  1400  vancomycin (VANCOCIN) IVPB 750 mg/150 ml premix  Status:  Discontinued     750 mg 150 mL/hr over 60 Minutes Intravenous Every 12 hours 07/04/18 1112 07/06/18 1046   07/03/18 0100  vancomycin (VANCOCIN) IVPB 750 mg/150 ml premix  Status:  Discontinued     750 mg 150 mL/hr over 60 Minutes Intravenous Every 12 hours 07/02/18 1659 07/04/18 1044   07/02/18 1700  ceFEPIme (MAXIPIME) 1 g in sodium chloride 0.9 % 100 mL IVPB  Status:  Discontinued     1 g 200 mL/hr over 30 Minutes Intravenous Every 8 hours 07/02/18 1650 07/02/18 1656   07/02/18 1700  ceFEPIme (MAXIPIME) 2 g in sodium chloride 0.9 % 100 mL IVPB  Status:  Discontinued     2 g 200 mL/hr over 30 Minutes Intravenous Every 8 hours 07/02/18 1624 07/04/18 1044   07/02/18 1200  vancomycin (VANCOCIN) 1,250 mg in sodium chloride 0.9 % 250 mL IVPB  Status:  Discontinued     1,250 mg 166.7 mL/hr over 90 Minutes Intravenous  Once 07/02/18 1146 07/02/18 1155   07/02/18 1200  Vancomycin (VANCOCIN) 1,250 mg in sodium chloride 0.9 % 250 mL IVPB     1,250 mg 166.7 mL/hr over 90 Minutes Intravenous  Once 07/02/18 1155 07/02/18 1429        Objective:   Vitals:   07/05/18 2127 07/06/18 0533 07/06/18 1328 07/06/18 1541  BP: 140/90 (!) 147/81 (!) 147/83 (!) 136/57  Pulse: 64 86  90  Resp: (!) 24 20  18   Temp: 99.4 F (37.4 C) 99 F (37.2 C)  98.4 F (36.9 C)  TempSrc: Oral Oral  Oral  SpO2: 98% 92%  95%  Weight:      Height:        Wt Readings from Last 3 Encounters:  07/02/18 70.6 kg  06/03/18 68.5 kg  05/18/18 72.1 kg  Intake/Output Summary (Last 24 hours) at 07/06/2018 1729 Last data filed at 07/06/2018 1600 Gross per 24 hour  Intake 1196.51 ml  Output 1200 ml  Net -3.49 ml   Physical exam General exam: Alert, awake, no distress Respiratory system: Occasional rhonchi. Respiratory effort normal. Cardiovascular system:RRR. No murmurs, rubs, gallops. Gastrointestinal system: Abdomen is nondistended, soft and nontender.  No organomegaly or masses felt. Normal bowel sounds heard. Central nervous system: No focal neurological deficits. Extremities: 1+ edema Skin: No rashes, lesions or ulcers Psychiatry: Pleasant, cooperative      Data Review:    CBC Recent Labs  Lab 07/02/18 1159 07/03/18 0651 07/04/18 0514 07/05/18 0457 07/06/18 0451  WBC 13.1* 17.3* 9.4 6.3 5.8  HGB 14.1 12.3* 10.2* 11.1* 10.3*  HCT 40.9 35.2* 31.0* 31.7* 29.7*  PLT 155 136* 113* 127* 139*  MCV 91.7 92.1 96.6 92.4 91.1  MCH 31.6 32.2 31.8 32.4 31.6  MCHC 34.5 34.9 32.9 35.0 34.7  RDW 13.2 13.5 13.9 13.2 12.8    Chemistries  Recent Labs  Lab 07/02/18 1159 07/03/18 0651 07/05/18 0457 07/06/18 0451  NA 139 139 136 137  K 3.5 3.2* 3.4* 3.1*  CL 102 108 107 105  CO2 28 22 21* 24  GLUCOSE 141* 94 176* 202*  BUN 19 22 18 13   CREATININE 0.87 0.77 0.75 0.64  CALCIUM 10.0 8.9 8.9 8.6*   ------------------------------------------------------------------------------------------------------------------ No results for input(s): CHOL, HDL, LDLCALC, TRIG, CHOLHDL, LDLDIRECT in the last 72 hours.  Lab Results  Component Value Date   HGBA1C 5.3 04/17/2018   ------------------------------------------------------------------------------------------------------------------ No results for input(s): TSH, T4TOTAL, T3FREE, THYROIDAB in the last 72 hours.  Invalid input(s): FREET3 ------------------------------------------------------------------------------------------------------------------ No results for input(s): VITAMINB12, FOLATE, FERRITIN, TIBC, IRON, RETICCTPCT in the last 72 hours.  Coagulation profile Recent Labs  Lab 07/02/18 1159  INR 1.2    No results for input(s): DDIMER in the last 72 hours.  Cardiac Enzymes No results for input(s): CKMB, TROPONINI, MYOGLOBIN in the last 168 hours.  Invalid input(s): CK  ------------------------------------------------------------------------------------------------------------------    Component Value Date/Time   BNP 132.0 (H) 03/16/2017 1925    Inpatient Medications  Scheduled Meds: . abiraterone acetate  500 mg Oral Daily  . bisacodyl  10 mg Rectal Once  . calcium-vitamin D  1 tablet Oral Daily  . cholecalciferol  1,000 Units Oral q morning - 10a  . diltiazem  30 mg Oral Q8H  . donepezil  10 mg Oral QHS  . feeding supplement (ENSURE ENLIVE)  237 mL Oral Daily  . fluticasone  2 spray Each Nare Daily  . furosemide  20 mg Oral Daily  . insulin aspart  0-5 Units Subcutaneous QHS  . insulin aspart  0-9 Units Subcutaneous TID WC  . ipratropium-albuterol  3 mL Nebulization Q6H  . lisinopril  20 mg Oral Daily  . mirtazapine  7.5 mg Oral QHS  . multivitamin with minerals  1 tablet Oral q morning - 10a  . pantoprazole  40 mg Oral Daily  . polycarbophil  625 mg Oral BID  . potassium chloride  20 mEq Oral Daily  . predniSONE  2.5 mg Oral BID  . sodium chloride flush  3 mL Intravenous Q12H   Continuous Infusions: . sodium chloride 500 mL (07/06/18 0315)  . ceFEPime (MAXIPIME) IV Stopped (07/06/18 1358)  . potassium chloride    . vancomycin 500 mg (07/06/18 1537)   PRN Meds:.sodium chloride, acetaminophen **OR** acetaminophen, guaiFENesin, haloperidol lactate, loperamide, ondansetron **OR** ondansetron (ZOFRAN) IV  Micro Results  Recent Results (from the past 240 hour(s))  Blood culture (routine x 2)     Status: None (Preliminary result)   Collection Time: 07/02/18 12:07 PM  Result Value Ref Range Status   Specimen Description   Final    RIGHT ANTECUBITAL BOTTLES DRAWN AEROBIC AND ANAEROBIC   Special Requests Blood Culture adequate volume  Final   Culture   Final    NO GROWTH 4 DAYS Performed at [facility], [address] [place], [place] [address]    Report Status PENDING  Incomplete  Urine culture     Status: Abnormal   Collection Time:  07/02/18  2:51 PM  Result Value Ref Range Status   Specimen Description   Final    URINE, CLEAN CATCH Performed at [facility], [address] [place], [place] [address]    Special Requests   Final    NONE Performed at [facility], [address], [place], [place] [address]    Culture (A)  Final    <10,000 COLONIES/mL INSIGNIFICANT GROWTH Performed at [facility], [place] [address] [place], [place] [address]    Report Status 07/04/2018 FINAL  Final  MRSA PCR Screening     Status: None   Collection Time: 07/03/18  9:40 AM  Result Value Ref Range Status   MRSA by PCR NEGATIVE NEGATIVE Final    Comment:        The GeneXpert MRSA Assay (FDA approved for NASAL specimens only), is one component of a comprehensive MRSA colonization surveillance program. It is not intended to diagnose MRSA infection nor to guide or monitor treatment for MRSA infections. Performed at [facility], [address] [place],  [address]     Radiology Reports Dg Chest 2 View  Result Date: 07/02/2018 CLINICAL DATA:  Cough.  Fever. EXAM: CHEST - 2 VIEW COMPARISON:  April 16, 2018 FINDINGS: New infiltrates seen in the right base. No other definitive infiltrates are noted. No pneumothorax. The cardiomediastinal silhouette is stable. IMPRESSION: New right basilar and middle lobe infiltrate consistent with pneumonia given history. Recommend treatment with short-term follow-up to ensure resolution. Electronically Signed   By: Dorise Bullion III M.D   On: 07/02/2018 13:13   Dg Swallowing Func-speech Pathology  Result Date: 07/04/2018 Objective Swallowing Evaluation: Type of Study: MBS-Modified Barium Swallow Study  Patient Details Name: [name] MRN: [id number] Date of Birth: Nov 20, 1923 Today's Date: 07/04/2018 Time: SLP Start Time (ACUTE ONLY): 8502 -SLP Stop Time (ACUTE ONLY): 1304 SLP Time Calculation (min) (ACUTE ONLY): 28 min Past Medical History: Past Medical History: Diagnosis Date . Acute  bronchitis  . Arthritis  . Atrial fibrillation ([place])   Dr. Harl Bowie- [facility] follows saw 11'14 . Bladder stones   tx. with oral meds and antibiotics, now surgery planned . Cataracts, both eyes   surgery planned May 2015 . Diabetes mellitus without complication ([place])   Type II . Dyspnea   with activity . Dysrhythmia   Afib . Family history of breast cancer  . Family history of colon cancer  . GERD (gastroesophageal reflux disease)  . GIST (gastrointestinal stromal tumor), malignant ([place]) 2005  Gastrointestinal stromal tumor, that is GIST, small bowel, 4.5 cm, intermediate prognostic grade found on the PET scan in the small bowel, accounting for that small bowel activity in October 2005 with resection by Dr. Margot Chimes, thus far without recurrence.  . Hypertension  . NHL (non-Hodgkin's lymphoma) ([place]) 2005  Diffuse large B-cell lymphoma, clinically stage IIIA, CD20 positive, status post cervical  lymph node biopsy 08/03/2003 on the left. PET scan was also positive in the spleen and small bowel region, but bone marrow aspiration and biopsy were negative. So he essentially had stage IIIAs. He received R-CHOP x6 cycles with CR established by PET scan criteria on 11/23/2003 with no evidence for relapse th . PAD (peripheral artery disease) ([place]) 08/27/2017 . Skin cancer   Basal cell- face,head, neck,  arms, legs, Back Past Surgical History: Past Surgical History: Procedure Laterality Date . AMPUTATION Left 06/29/2017  Procedure: AMPUTATION LEFT GREAT TOE;  Surgeon: [name], MD;  Location: [facility];  Service: Vascular;  Laterality: Left; . AMPUTATION Left 08/27/2017  Procedure: AMPUTATION Left SECOND TOE;  Surgeon: [name], MD;  Location: [place];  Service: Vascular;  Laterality: Left; . CATARACT EXTRACTION Bilateral 2015 . CHOLECYSTECTOMY   . COLON RESECTION    small bowel . CYSTOSCOPY WITH LITHOLAPAXY N/A 08/10/2013  Procedure: CYSTOSCOPY WITH LITHOLAPAXY WITH [name];  Surgeon: [name], MD;  Location:  [facility];  Service: Urology;  Laterality: N/A; . ESOPHAGEAL DILATION N/A 02/27/2016  Procedure: ESOPHAGEAL DILATION;  Surgeon: [name], MD;  [facility];  Service: Endoscopy;  Laterality: N/A; . ESOPHAGOGASTRODUODENOSCOPY N/A 02/27/2016  Procedure: ESOPHAGOGASTRODUODENOSCOPY (EGD);  Surgeon: [name], MD;  [facility];  Service: Endoscopy;  Laterality: N/A;  12:00 . INCISION AND DRAINAGE PERIRECTAL ABSCESS   . LOWER EXTREMITY ANGIOGRAPHY N/A 06/25/2017  Procedure: LOWER EXTREMITY ANGIOGRAPHY;  Surgeon: [name], MD;  Location: [facility];  Service: Cardiovascular;  Laterality: N/A; . LYMPH NODE DISSECTION Left   '05-neck . PORT-A-CATH REMOVAL   . PORTACATH PLACEMENT    insertion and removal -last chemotherapy 10 yrs ago . TRANSURETHRAL RESECTION OF PROSTATE N/A 08/10/2013  Procedure: TRANSURETHRAL RESECTION OF THE PROSTATE WITH GYRUS INSTRUMENTS;  Surgeon: [name], MD;  Location: [facility];  Service: Urology;  Laterality: N/A; HPI: 85 year old male with metastatic prostate cancer, A. fib on Eliquis, type 2 diabetes mellitus, NHL completed cycle of R-CHOP without relapse, peripheral vascular disease, hypertension, chronic diarrhea and GERD with 2 prior hospitalization in the past 6 months with pneumonia and concern for aspiration brought from home with increased urinary frequency for past 5 days and 1 day of cough with chills, congestion and shortness of breath. Patient found to be in early sepsis secondary to UTI and lobar pneumonia. ST to complete BSE  No data recorded Assessment / Plan / Recommendation CHL IP CLINICAL IMPRESSIONS 07/04/2018 Clinical Impression Pt presents with normal oropharyngeal phase of swallow and suspected esophageal dysphagia characterized by min delayed oral transit with solids and piecemeal deglutition, swallow trigger at the level of the valleculae across textures and consistencies (except for with straw sips), and min decreased  tongue base retraction resulting in trace base of tongue residuals post swallow which clear with repeat swallow. Pt had a single episode of trace penetration of thins when taking sequential straw sips of thin, however no aspiration observed. The barium tablet became delayed in the distal esophagus and never passed through the UES despite Pt being given extra solids, liquids, and extra time. Pt with history of esophageal dilation with Dr. Laural Golden in the past and may benefit from an outpatient appointment with him. Pt was given puree bolus in attempt to clear the pill, however backflow of bolus was noted to the level of the cervical esophagus. Pt reportedly with several bouts of PNA in the past few months and question whether related to esophageal  dysphagia. Reflux precautions and results of MBSS were reviewed with the Pt and his daughter. Recommend D3/mech soft and thin liquids, swallow two times for each sip if using a straw due to hand tremor, consume more frequent/smaller meals, and remain upright for at least 30 minutes following meals.  SLP Visit Diagnosis Dysphagia, oropharyngeal phase (R13.12) Attention and concentration deficit following -- Frontal lobe and executive function deficit following -- Impact on safety and function Mild aspiration risk   CHL IP TREATMENT RECOMMENDATION 07/04/2018 Treatment Recommendations No treatment recommended at this time   Prognosis 07/04/2018 Prognosis for Safe Diet Advancement Good Barriers to Reach Goals Cognitive deficits Barriers/Prognosis Comment -- CHL IP DIET RECOMMENDATION 07/04/2018 SLP Diet Recommendations Dysphagia 3 (Mech soft) solids;Thin liquid Liquid Administration via Cup;Straw Medication Administration Whole meds with liquid Compensations Multiple dry swallows after each bite/sip Postural Changes Remain semi-upright after after feeds/meals (Comment);Seated upright at 90 degrees   CHL IP OTHER RECOMMENDATIONS 07/04/2018 Recommended Consults Consider esophageal  assessment Oral Care Recommendations Oral care BID;Staff/trained caregiver to provide oral care Other Recommendations Clarify dietary restrictions   CHL IP FOLLOW UP RECOMMENDATIONS 07/04/2018 Follow up Recommendations None;24 hour supervision/assistance   CHL IP FREQUENCY AND DURATION 07/04/2018 Speech Therapy Frequency (ACUTE ONLY) min 1 x/week Treatment Duration 1 week      CHL IP ORAL PHASE 07/04/2018 Oral Phase Impaired Oral - Pudding Teaspoon -- Oral - Pudding Cup -- Oral - Honey Teaspoon -- Oral - Honey Cup -- Oral - Nectar Teaspoon -- Oral - Nectar Cup -- Oral - Nectar Straw -- Oral - Thin Teaspoon -- Oral - Thin Cup -- Oral - Thin Straw -- Oral - Puree Piecemeal swallowing Oral - Mech Soft -- Oral - Regular Piecemeal swallowing;Delayed oral transit Oral - Multi-Consistency -- Oral - Pill -- Oral Phase - Comment --  CHL IP PHARYNGEAL PHASE 07/04/2018 Pharyngeal Phase Impaired Pharyngeal- Pudding Teaspoon -- Pharyngeal -- Pharyngeal- Pudding Cup -- Pharyngeal -- Pharyngeal- Honey Teaspoon -- Pharyngeal -- Pharyngeal- Honey Cup -- Pharyngeal -- Pharyngeal- Nectar Teaspoon -- Pharyngeal -- Pharyngeal- Nectar Cup -- Pharyngeal -- Pharyngeal- Nectar Straw -- Pharyngeal -- Pharyngeal- Thin Teaspoon Delayed swallow initiation-vallecula;Reduced tongue base retraction Pharyngeal -- Pharyngeal- Thin Cup Delayed swallow initiation-vallecula;Reduced tongue base retraction Pharyngeal -- Pharyngeal- Thin Straw Delayed swallow initiation-vallecula;Delayed swallow initiation-pyriform sinuses;Penetration/Aspiration during swallow;Reduced tongue base retraction;Pharyngeal residue - valleculae Pharyngeal Material does not enter airway;Material enters airway, remains ABOVE vocal cords then ejected out Pharyngeal- Puree Delayed swallow initiation-vallecula Pharyngeal -- Pharyngeal- Mechanical Soft -- Pharyngeal -- Pharyngeal- Regular Delayed swallow initiation-vallecula Pharyngeal -- Pharyngeal- Multi-consistency -- Pharyngeal --  Pharyngeal- Pill WFL Pharyngeal -- Pharyngeal Comment --  CHL IP CERVICAL ESOPHAGEAL PHASE 07/04/2018 Cervical Esophageal Phase Impaired Pudding Teaspoon -- Pudding Cup -- Honey Teaspoon -- Honey Cup -- Nectar Teaspoon -- Nectar Cup -- Nectar Straw -- Thin Teaspoon -- Thin Cup -- Thin Straw -- Puree -- Mechanical Soft -- Regular -- Multi-consistency -- Pill -- Cervical Esophageal Comment prominent cricopharyngeus, barium tablet remained in distal esophagus, some backflow of bolus from distal esophagus to cervical esophagus Thank you, Genene Churn, [place] PORTER,DABNEY 07/04/2018, 5:28 PM               Time Spent in minutes  25   Kathie Dike M.D on 07/06/2018 at 5:29 PM  After 7pm go to [email or web address]    [facility] -  Office  [phone]

## 2018-07-06 NOTE — Progress Notes (Signed)
Pharmacy Antibiotic Note  [name] is a 85 y.o. male admitted on 07/02/2018 with (HCAP) pneumonia/UTI.  Pharmacy has been consulted for Vancomycin and Cefepime dosing.  Plan: Vancomycin trough 20 Decrease to Vancomycin 500 mg IV every 12 hours. Cefepime 2gm IV q8h Consider alternative antibiotic for enterococcus UTI  Height: 5\' 10"  (177.8 cm) Weight: 155 lb 10.3 oz (70.6 kg) IBW/kg (Calculated) : 73  Temp (24hrs), Avg:99 F (37.2 C), Min:98.6 F (37 C), Max:99.4 F (37.4 C)  Recent Labs  Lab 07/02/18 1159 07/02/18 1510 07/02/18 1728 07/03/18 0651 07/04/18 0514 07/05/18 0457 07/05/18 1424 07/06/18 0451  WBC 13.1*  --   --  17.3* 9.4 6.3  --  5.8  CREATININE 0.87  --   --  0.77  --  0.75  --  0.64  LATICACIDVEN 2.9* 2.7* 2.4*  --   --   --   --   --   VANCOTROUGH  --   --   --   --   --   --  20  --     Estimated Creatinine Clearance: 58.8 mL/min (by C-G formula based on SCr of 0.64 mg/dL).    Allergies  Allergen Reactions  . Tape Other (See Comments)    SKIN IS VERY THIN AND WILL TEAR AND BRUISE VERY EASILY!!!!  . Augmentin [Amoxicillin-Pot Clavulanate] Rash and Other (See Comments)    Redness of skin    Antimicrobials this admission: Cefepime 3/14 >>  Vancomycin 3/14>>  Dose adjustments this admission: n/a  Microbiology results: 3/14 BCx: ngtd 3/14 UCx: insignificant growth 3/9 UCx from outside: E.Faecalis only 10-50K org   S-amp, vanc, nitrofuran 3/14 Influenza: negative  3/15 MRSA PCR: negative  Thank you for allowing pharmacy to be a part of this patient's care.  Margot Ables, PharmD Clinical Pharmacist 07/06/2018 10:48 AM

## 2018-07-06 NOTE — Evaluation (Signed)
Physical Therapy Evaluation Patient Details Name: VLADIMIR LENHOFF MRN: [id number] DOB: 04-15-1924 Today's Date: 07/06/2018   History of Present Illness  [place]iana Pechacek  is a 85 y.o. male, with history of metastatic breast cancer, A. fib on Eliquis, type 2 diabetes mellitus, GERD, non-Hodgkin's lymphoma stage [place] cycles of R-CHOP without relapse, peripheral vascular disease, hypertension, chronic diarrhea with prior to hospitalization in the past 6 months with pneumonia who was brought from home by his daughter with chills and increasing cough since this morning.  Daughter reports that patient had increased frequency of urination 5 days back for which the home health sent a UA.  She received a call from the home health yesterday and was notified that his urine culture grew 10 K-15 K enterococcus faecalis sensitive to ampicillin, nitrofurantoin and vancomycin but patient had not been started on antibiotic yet.  This morning she found him to be increasingly coughing and short of breath.  Was also warm and was shaking.  Patient lives with his wife who does not have any respiratory symptoms, no sick contact or recent travel.    Clinical Impression  Patient limited for functional mobility as stated below secondary to BLE weakness, fatigue and fair/poor standing balance.  Patient required repeated verbal/tactile cueing mostly due to very HOH, no loss of balance during ambulation and tolerated sitting up in chair after therapy.   Patient will benefit from continued physical therapy in hospital and recommended venue below to increase strength, balance, endurance for safe ADLs and gait.    Follow Up Recommendations SNF;Supervision for mobility/OOB;Supervision/Assistance - 24 hour    Equipment Recommendations  None recommended by PT    Recommendations for Other Services       Precautions / Restrictions Precautions Precautions: Fall Restrictions Weight Bearing Restrictions: No      Mobility  Bed  Mobility Overal bed mobility: Needs Assistance Bed Mobility: Supine to Sit     Supine to sit: Min guard     General bed mobility comments: increased time, slightly labored movement  Transfers Overall transfer level: Needs assistance Equipment used: Rolling walker (2 wheeled) Transfers: Sit to/from [facility] Sit to Stand: Min assist Stand pivot transfers: Min assist       General transfer comment: increased time, slightly labored movement  Ambulation/Gait Ambulation/Gait assistance: Min guard;Min assist Gait Distance (Feet): 80 Feet Assistive device: Rolling walker (2 wheeled) Gait Pattern/deviations: Decreased step length - right;Decreased step length - left;Decreased stride length Gait velocity: decreased   General Gait Details: slightly labored cadence without loss of balance, diffiuclty making turns, repeated verbal/tactile cue mostly due to [facility], limited secondary to fatigue  Stairs            Wheelchair Mobility    Modified Rankin (Stroke Patients Only)       Balance Overall balance assessment: Needs assistance Sitting-balance support: Feet supported;No upper extremity supported Sitting balance-Leahy Scale: Good     Standing balance support: Bilateral upper extremity supported;During functional activity Standing balance-Leahy Scale: Fair Standing balance comment: using RW                             Pertinent Vitals/Pain Pain Assessment: No/denies pain    Home Living Family/patient expects to be discharged to:: Private residence Living Arrangements: Spouse/significant other Available Help at Discharge: Family;Available 24 hours/day Type of Home: House Home Access: Stairs to enter;Ramped entrance Entrance Stairs-Rails: Right Entrance Stairs-Number of Steps: 3 in front and 4 steps  in back, 1 siderail on right side going up in front Home Layout: Able to live on main level with bedroom/bathroom;One level;Laundry or work  area in [place]: Environmental consultant - 2 wheels;Bedside commode;Cane - single point;Wheelchair - manual;Tub bench;Toilet riser      Prior Function Level of Independence: Needs assistance   Gait / Transfers Assistance Needed: Supervised household ambulator with RW  ADL's / Homemaking Assistance Needed: assisted by spouse        Hand Dominance   Dominant Hand: Right    Extremity/Trunk Assessment   Upper Extremity Assessment Upper Extremity Assessment: Generalized weakness    Lower Extremity Assessment Lower Extremity Assessment: Generalized weakness    Cervical / Trunk Assessment Cervical / Trunk Assessment: Normal  Communication   Communication: HOH  Cognition Arousal/Alertness: Awake/alert Behavior During Therapy: WFL for tasks assessed/performed Overall Cognitive Status: Within Functional Limits for tasks assessed                                        General Comments      Exercises     Assessment/Plan    PT Assessment Patient needs continued PT services  PT Problem List Decreased strength;Decreased activity tolerance;Decreased balance;Decreased mobility       PT Treatment Interventions Therapeutic exercise;Gait training;Stair training;Functional mobility training;Therapeutic activities;Patient/family education    PT Goals (Current goals can be found in the Care Plan section)  Acute Rehab PT Goals Patient Stated Goal: return home with family to assist PT Goal Formulation: With patient/family Time For Goal Achievement: 07/20/18 Potential to Achieve Goals: Good    Frequency Min 3X/week   Barriers to discharge        Co-evaluation               AM-PAC PT "6 Clicks" Mobility  Outcome Measure Help needed turning from your back to your side while in a flat bed without using bedrails?: None Help needed moving from lying on your back to sitting on the side of a flat bed without using bedrails?: A Little Help needed moving to  and from a bed to a chair (including a wheelchair)?: A Little Help needed standing up from a chair using your arms (e.g., wheelchair or bedside chair)?: A Little Help needed to walk in hospital room?: A Little Help needed climbing 3-5 steps with a railing? : A Lot 6 Click Score: 18    End of Session   Activity Tolerance: Patient tolerated treatment well;Patient limited by fatigue Patient left: in chair;with call bell/phone within reach;with family/visitor present;with chair alarm set Nurse Communication: Mobility status PT Visit Diagnosis: Unsteadiness on feet (R26.81);Other abnormalities of gait and mobility (R26.89);Muscle weakness (generalized) (M62.81)    Time: 6294-7654 PT Time Calculation (min) (ACUTE ONLY): 31 min   Charges:   PT Evaluation $PT Eval Moderate Complexity: 1 Mod PT Treatments $Therapeutic Activity: 23-37 mins        2:25 PM, 07/06/18 Lonell Grandchild, MPT Physical Therapist with [facility] 336 [phone] office [phone] mobile phone

## 2018-07-06 NOTE — Progress Notes (Signed)
Inpatient Diabetes Program Recommendations  AACE/ADA: New Consensus Statement on Inpatient Glycemic Control  Target Ranges:  Prepandial:   less than 140 mg/dL      Peak postprandial:   less than 180 mg/dL (1-2 hours)      Critically ill patients:  140 - 180 mg/dL   Results for EPIMENIO, SCHETTER (MRN [id number]) as of 07/06/2018 07:46  Ref. Range 07/05/2018 07:38 07/05/2018 10:53 07/05/2018 17:40 07/05/2018 21:52 07/06/2018 07:37  Glucose-Capillary Latest Ref Range: 70 - 99 mg/dL 163 (H) 225 (H) 290 (H) 122 (H) 176 (H)    Review of Glycemic Control  Diabetes history: DM2 Outpatient Diabetes medications: Glipizide XL 5 mg QAM, Metformin 850 mg BID Current orders for Inpatient glycemic control: Novolog 0-9 units TID with meals, Novolog 0-5 units QHS; Prednisone 2.5 mg BID  Inpatient Diabetes Program Recommendations:  Insulin - Meal Coverage: If steroids are continued, please consider ordering Novolog 2 units TID with meals for meal coverage if patient eats at least 50% of meals.  Thanks, Barnie Alderman, RN, MSN, CDE Diabetes Coordinator Inpatient Diabetes Program [phone] (Team Pager from 8am to 5pm)

## 2018-07-07 ENCOUNTER — Encounter (HOSPITAL_COMMUNITY): Payer: Self-pay | Admitting: *Deleted

## 2018-07-07 ENCOUNTER — Encounter (HOSPITAL_COMMUNITY)
Admission: EM | Disposition: A | Discharge status: Home Health | Payer: Self-pay | Source: Home / Self Care | Attending: Internal Medicine

## 2018-07-07 HISTORY — PX: ESOPHAGOGASTRODUODENOSCOPY: SHX5428

## 2018-07-07 HISTORY — PX: ESOPHAGEAL DILATION: SHX303

## 2018-07-07 LAB — CULTURE, BLOOD (ROUTINE X 2)
Culture: NO GROWTH
Special Requests: ADEQUATE

## 2018-07-07 LAB — GLUCOSE, CAPILLARY
Glucose-Capillary: 180 mg/dL — ABNORMAL HIGH (ref 70–99)
Glucose-Capillary: 207 mg/dL — ABNORMAL HIGH (ref 70–99)
Glucose-Capillary: 87 mg/dL (ref 70–99)
Glucose-Capillary: 213 mg/dL — ABNORMAL HIGH (ref 70–99)

## 2018-07-07 SURGERY — EGD (ESOPHAGOGASTRODUODENOSCOPY)
Anesthesia: Moderate Sedation

## 2018-07-07 MED ORDER — STERILE WATER FOR IRRIGATION IR SOLN
Status: DC | PRN
  Administered 2018-07-07: 15:00:00

## 2018-07-07 MED ORDER — LIDOCAINE VISCOUS HCL 2 % MT SOLN
OROMUCOSAL | Status: AC
  Filled 2018-07-07: qty 15

## 2018-07-07 MED ORDER — SODIUM CHLORIDE 0.9 % IV SOLN
INTRAVENOUS | Status: DC
  Administered 2018-07-07: 14:00:00 via INTRAVENOUS

## 2018-07-07 MED ORDER — MEPERIDINE HCL 50 MG/ML IJ SOLN
INTRAMUSCULAR | Status: AC
  Filled 2018-07-07: qty 1

## 2018-07-07 MED ORDER — LIDOCAINE VISCOUS HCL 2 % MT SOLN
OROMUCOSAL | Status: DC | PRN
  Administered 2018-07-07: 4 mL via OROMUCOSAL

## 2018-07-07 MED ORDER — MIDAZOLAM HCL 5 MG/5ML IJ SOLN
INTRAMUSCULAR | Status: DC | PRN
  Administered 2018-07-07 (×2): 1 mg via INTRAVENOUS

## 2018-07-07 MED ORDER — MIDAZOLAM HCL 5 MG/5ML IJ SOLN
INTRAMUSCULAR | Status: AC
  Filled 2018-07-07: qty 10

## 2018-07-07 NOTE — TOC Transition Note (Signed)
Transition of Care [facility]) - CM/SW Discharge Note   Patient Details  Name: HARLEY FITZWATER MRN: [id number] Date of Birth: Jun 01, 1923  [facility]) CM/SW Contact:  Pammy Vesey, Chauncey Reading, RN Phone Number: 07/07/2018, 2:27 PM   Clinical Narrative:   Family now elects to take patient home with resumption of home health with [facility]. Cassie of [facility] aware and following patient. Patient will need new home health orders for RN and PT.     Final next level of care: [place] Barriers to Discharge: No Barriers Identified   Patient Goals and CMS Choice Patient states their goals for this hospitalization and ongoing recovery are:: get back home and get back to baseline CMS Medicare.gov Compare Post Acute Care list provided to:: Patient(and daughter, [name]) Choice offered to / list presented to : Patient, Adult Children     Discharge Plan and Services     Post Acute Care Choice: Resumption of Svcs/PTA Provider               Readmission Risk Interventions Readmission Risk Prevention Plan 07/06/2018 07/04/2018  Transportation Screening - Complete  Medication Review Press photographer) - Complete  PCP or Specialist appointment within 3-5 days of discharge Complete -  HRI or [place] - Complete  SW Recovery Care/Counseling Consult - Complete  Palliative Care Screening Not Applicable -  Some recent data might be hidden

## 2018-07-07 NOTE — Care Management (Signed)
H&P and progress notes sent in [place] to [facility] per request for screening purposes due to COVID-19 pandemic.

## 2018-07-07 NOTE — Progress Notes (Signed)
Physical Therapy Treatment Patient Details Name: [name] MRN: [id number] DOB: 1924/02/12 Today's Date: 07/07/2018    History of Present Illness Christy Friede  is a 85 y.o. male, with history of metastatic breast cancer, A. fib on Eliquis, type 2 diabetes mellitus, GERD, non-Hodgkin's lymphoma stage [place] cycles of R-CHOP without relapse, peripheral vascular disease, hypertension, chronic diarrhea with prior to hospitalization in the past 6 months with pneumonia who was brought from home by his daughter with chills and increasing cough since this morning.  Daughter reports that patient had increased frequency of urination 5 days back for which the home health sent a UA.  She received a call from the home health yesterday and was notified that his urine culture grew 10 K-15 K enterococcus faecalis sensitive to ampicillin, nitrofurantoin and vancomycin but patient had not been started on antibiotic yet.  This morning she found him to be increasingly coughing and short of breath.  Was also warm and was shaking.  Patient lives with his wife who does not have any respiratory symptoms, no sick contact or recent travel.    PT Comments    Pt received in bed with dtr at bedside and was agreeable to PT treatment. Pt demo'd improved tolerance for functional mobility this date as he was able to ambulate ~242ft with RW with min guard to min A for gait. Also performed standing balance while pt looking out windows; he did well with this, not demo'ing any LOB or unsteadiness. PT spoke with dtr about d/c plans and she stated that as of now, she is intending to move back in with her parents to be able to assist once ready for d/c. Due to pt's functional mobility, PT recommending HHPT with 24-hour supervision/assist; pt's dtr aware of this and educated that if she cannot move in with her parents, and the pt ends up not having caregiver support at home, d/c plans will be updated accordingly and she verbalized  understanding.     Follow Up Recommendations  Home health PT;Supervision/Assistance - 24 hour;Supervision for mobility/OOB     Equipment Recommendations  None recommended by PT    Recommendations for Other Services       Precautions / Restrictions Precautions Precautions: Fall Restrictions Weight Bearing Restrictions: No    Mobility  Bed Mobility Overal bed mobility: Needs Assistance Bed Mobility: Supine to Sit     Supine to sit: Min guard;Supervision     General bed mobility comments: increased time, slightly labored movement  Transfers Overall transfer level: Needs assistance Equipment used: Rolling walker (2 wheeled) Transfers: Sit to/from [facility] Sit to Stand: Min guard Stand pivot transfers: Min guard       General transfer comment: increased time, slightly labored movement  Ambulation/Gait Ambulation/Gait assistance: Min guard;Min assist Gait Distance (Feet): 250 Feet Assistive device: Rolling walker (2 wheeled) Gait Pattern/deviations: Decreased stride length;Step-through pattern;Trunk flexed     General Gait Details: cues to improve posture; slightly labored gait but overall good speed, stability, with improved endurance this date   Stairs             Wheelchair Mobility    Modified Rankin (Stroke Patients Only)       Balance Overall balance assessment: Needs assistance Sitting-balance support: Feet supported;No upper extremity supported Sitting balance-Leahy Scale: Good     Standing balance support: Bilateral upper extremity supported;During functional activity Standing balance-Leahy Scale: Fair Standing balance comment: using RW  Cognition Arousal/Alertness: Awake/alert Behavior During Therapy: WFL for tasks assessed/performed Overall Cognitive Status: Within Functional Limits for tasks assessed                                        Exercises Other  Exercises Other Exercises: standing balance while looking out windows x3-5 mins total    General Comments        Pertinent Vitals/Pain Pain Assessment: No/denies pain    Home Living                      Prior Function            PT Goals (current goals can now be found in the care plan section) Acute Rehab PT Goals Patient Stated Goal: return home with family to assist PT Goal Formulation: With patient/family Time For Goal Achievement: 07/20/18 Potential to Achieve Goals: Good    Frequency    Min 3X/week      PT Plan      Co-evaluation              AM-PAC PT "6 Clicks" Mobility   Outcome Measure  Help needed turning from your back to your side while in a flat bed without using bedrails?: None Help needed moving from lying on your back to sitting on the side of a flat bed without using bedrails?: A Little Help needed moving to and from a bed to a chair (including a wheelchair)?: A Little Help needed standing up from a chair using your arms (e.g., wheelchair or bedside chair)?: A Little Help needed to walk in hospital room?: A Little Help needed climbing 3-5 steps with a railing? : A Lot 6 Click Score: 18    End of Session Equipment Utilized During Treatment: Gait belt Activity Tolerance: Patient tolerated treatment well;Patient limited by fatigue Patient left: in chair;with call bell/phone within reach;with family/visitor present;with chair alarm set Nurse Communication: Mobility status PT Visit Diagnosis: Unsteadiness on feet (R26.81);Other abnormalities of gait and mobility (R26.89);Muscle weakness (generalized) (M62.81)     Time: 9169-4503 PT Time Calculation (min) (ACUTE ONLY): 24 min  Charges:  $Gait Training: 8-22 mins $Therapeutic Activity: 8-22 mins                         Geraldine Solar PT, DPT

## 2018-07-07 NOTE — Progress Notes (Signed)
PROGRESS NOTE                                                                                                                                                                                                             Patient Demographics:    [name], is a 85 y.o. male, DOB - May 12, 1923, HYI:[id number]  Admit date - 07/02/2018   Admitting Physician [name] Dike, MD  Outpatient Primary MD for the patient is [name] Fire, MD  LOS - 5  Outpatient Specialists:  Chief Complaint  Patient presents with  . Cough  . Fever       Brief Narrative   85 year old male with metastatic prostate cancer, A. fib on Eliquis, type 2 diabetes mellitus, NHL completed cycle of R-CHOP without relapse, peripheral vascular disease, hypertension, chronic diarrhea and GERD with 2 prior hospitalization in the past 6 months with pneumonia and concern for aspiration brought from home with increased urinary frequency for past 5 days and 1 day of cough with chills, congestion and shortness of breath. Patient found to be in early sepsis secondary to UTI and lobar pneumonia.   Subjective:   Had some confusion overnight, but feeling better now.  Was able to ambulate with physical therapy.   Assessment  & Plan :   Sepsis ([place]) secondary to UTI and healthcare associated pneumonia Sepsis pathway initiated in the ED.  Currently resolved.  Blood cultures show no growth.  Leukocytosis resolved.  Active Problems: UTI secondary to Enterococcus faecalis Culture sent on 3/9 (by home health) for dysuria growing 10-50 K organism, sensitive to vancomycin, nitrofurantoin and ampicillin as per daughter.  Continue empiric vancomycin.  Follow urine culture sent in the ED.    HCAP (healthcare-associated pneumonia) Recurrent pneumonia in past 6 months (third episode) with aspiration risk.  Last hospitalized in December 2019. Empiric IV vancomycin and  cefepime.    Influenza panel was found to be negative.  Urinary antigens were ordered, but not sent.  Blood cultures are shown no growth. Continue antitussives.  Dysphagia Daughter reports that patient has been followed by  swallow nurse at home.    He was seen by speech therapy and had MBSS performed. It was felt that symptoms may be related to esophageal issues. He has required esophageal dilatation in the past.  Seen by GI and underwent endoscopy which noted  abnormal esophageal motility noncritical Schatzki's ring.  This was dilated up to 18 mm.  Dementia with acute delirium Secondary to acute illness.  Continue Aricept.  Has had sundowning during previous hospital stay.  Monitor closely.   Avoid benzos and narcotics.  Overall mental status is better  Paroxysmal A. fib Currently rate controlled.    Continue Cardizem.  Resume Eliquis in a.m.    NHL (non-Hodgkin's lymphoma) (HCC)   GIST (gastrointestinal stromal tumor), malignant ([place])   Prostate cancer ([place]) Follows with [name].  Has bony metastasis.  Continue outpatient prednisone and Zytiga    Type 2 diabetes mellitus (HCC) CBG stable.  Holding oral hypoglycemic and monitor on sliding scale coverage.  Hypokalemia Replenished   Code Status : DNR  Family Communication  : daughter at bedside  Disposition Plan  : Anticipate discharge home tomorrow with home health physical therapy  Barriers For Discharge : Active symptoms  Consults  : Gastroenterology  Procedures  : None  DVT Prophylaxis  : SCDs  Lab Results  Component Value Date   PLT 139 (L) 07/06/2018    Antibiotics  :    Anti-infectives (From admission, onward)   Start     Dose/Rate Route Frequency Ordered Stop   07/06/18 1400  vancomycin (VANCOCIN) 500 mg in sodium chloride 0.9 % 100 mL IVPB     500 mg 100 mL/hr over 60 Minutes Intravenous Every 12 hours 07/06/18 1046     07/04/18 1400  ceFEPIme (MAXIPIME) 2 g in sodium chloride 0.9 % 100 mL IVPB      2 g 200 mL/hr over 30 Minutes Intravenous Every 8 hours 07/04/18 1112     07/04/18 1400  vancomycin (VANCOCIN) IVPB 750 mg/150 ml premix  Status:  Discontinued     750 mg 150 mL/hr over 60 Minutes Intravenous Every 12 hours 07/04/18 1112 07/06/18 1046   07/03/18 0100  vancomycin (VANCOCIN) IVPB 750 mg/150 ml premix  Status:  Discontinued     750 mg 150 mL/hr over 60 Minutes Intravenous Every 12 hours 07/02/18 1659 07/04/18 1044   07/02/18 1700  ceFEPIme (MAXIPIME) 1 g in sodium chloride 0.9 % 100 mL IVPB  Status:  Discontinued     1 g 200 mL/hr over 30 Minutes Intravenous Every 8 hours 07/02/18 1650 07/02/18 1656   07/02/18 1700  ceFEPIme (MAXIPIME) 2 g in sodium chloride 0.9 % 100 mL IVPB  Status:  Discontinued     2 g 200 mL/hr over 30 Minutes Intravenous Every 8 hours 07/02/18 1624 07/04/18 1044   07/02/18 1200  vancomycin (VANCOCIN) 1,250 mg in sodium chloride 0.9 % 250 mL IVPB  Status:  Discontinued     1,250 mg 166.7 mL/hr over 90 Minutes Intravenous  Once 07/02/18 1146 07/02/18 1155   07/02/18 1200  Vancomycin (VANCOCIN) 1,250 mg in sodium chloride 0.9 % 250 mL IVPB     1,250 mg 166.7 mL/hr over 90 Minutes Intravenous  Once 07/02/18 1155 07/02/18 1429        Objective:   Vitals:   07/07/18 1445 07/07/18 1450 07/07/18 1455 07/07/18 1500  BP: (!) 162/107 (!) 135/94 (!) 150/107   Pulse: (!) 51 71 (!) 57 (!) 112  Resp: (!) 23 20 20 19   Temp:      TempSrc:      SpO2: 99% 100% 100% 99%  Weight:      Height:        Wt Readings from Last 3 Encounters:  07/02/18 70.6 kg  06/03/18  68.5 kg  05/18/18 72.1 kg     Intake/Output Summary (Last 24 hours) at 07/07/2018 1925 Last data filed at 07/07/2018 1600 Gross per 24 hour  Intake 519.43 ml  Output 2250 ml  Net -1730.57 ml   Physical exam General exam: Alert, awake, no distress Respiratory system: Clear to auscultation. Respiratory effort normal. Cardiovascular system:RRR. No murmurs, rubs, gallops. Gastrointestinal  system: Abdomen is nondistended, soft and nontender. No organomegaly or masses felt. Normal bowel sounds heard. Central nervous system: No focal neurological deficits. Extremities: No C/C/E, +pedal pulses Skin: No rashes, lesions or ulcers Psychiatry: Pleasant, cooperative     Data Review:    CBC Recent Labs  Lab 07/02/18 1159 07/03/18 0651 07/04/18 0514 07/05/18 0457 07/06/18 0451  WBC 13.1* 17.3* 9.4 6.3 5.8  HGB 14.1 12.3* 10.2* 11.1* 10.3*  HCT 40.9 35.2* 31.0* 31.7* 29.7*  PLT 155 136* 113* 127* 139*  MCV 91.7 92.1 96.6 92.4 91.1  MCH 31.6 32.2 31.8 32.4 31.6  MCHC 34.5 34.9 32.9 35.0 34.7  RDW 13.2 13.5 13.9 13.2 12.8    Chemistries  Recent Labs  Lab 07/02/18 1159 07/03/18 0651 07/05/18 0457 07/06/18 0451  NA 139 139 136 137  K 3.5 3.2* 3.4* 3.1*  CL 102 108 107 105  CO2 28 22 21* 24  GLUCOSE 141* 94 176* 202*  BUN 19 22 18 13   CREATININE 0.87 0.77 0.75 0.64  CALCIUM 10.0 8.9 8.9 8.6*   ------------------------------------------------------------------------------------------------------------------ No results for input(s): CHOL, HDL, LDLCALC, TRIG, CHOLHDL, LDLDIRECT in the last 72 hours.  Lab Results  Component Value Date   HGBA1C 5.3 04/17/2018   ------------------------------------------------------------------------------------------------------------------ No results for input(s): TSH, T4TOTAL, T3FREE, THYROIDAB in the last 72 hours.  Invalid input(s): FREET3 ------------------------------------------------------------------------------------------------------------------ No results for input(s): VITAMINB12, FOLATE, FERRITIN, TIBC, IRON, RETICCTPCT in the last 72 hours.  Coagulation profile Recent Labs  Lab 07/02/18 1159  INR 1.2    No results for input(s): DDIMER in the last 72 hours.  Cardiac Enzymes No results for input(s): CKMB, TROPONINI, MYOGLOBIN in the last 168 hours.  Invalid input(s): CK  ------------------------------------------------------------------------------------------------------------------    Component Value Date/Time   BNP 132.0 (H) 03/16/2017 1925    Inpatient Medications  Scheduled Meds: . abiraterone acetate  500 mg Oral Daily  . bisacodyl  10 mg Rectal Once  . calcium-vitamin D  1 tablet Oral Daily  . cholecalciferol  1,000 Units Oral q morning - 10a  . diltiazem  30 mg Oral Q8H  . donepezil  10 mg Oral QHS  . feeding supplement (ENSURE ENLIVE)  237 mL Oral Daily  . fluticasone  2 spray Each Nare Daily  . furosemide  20 mg Oral Daily  . insulin aspart  0-5 Units Subcutaneous QHS  . insulin aspart  0-9 Units Subcutaneous TID WC  . ipratropium-albuterol  3 mL Nebulization BID  . lidocaine      . lisinopril  20 mg Oral Daily  . midazolam      . mirtazapine  7.5 mg Oral QHS  . multivitamin with minerals  1 tablet Oral q morning - 10a  . pantoprazole  40 mg Oral Daily  . polycarbophil  625 mg Oral BID  . potassium chloride  20 mEq Oral Daily  . predniSONE  2.5 mg Oral BID  . sodium chloride flush  3 mL Intravenous Q12H   Continuous Infusions: . sodium chloride 500 mL (07/06/18 0315)  . ceFEPime (MAXIPIME) IV 2 g (07/07/18 0542)  . vancomycin 500 mg (07/07/18  0207)   PRN Meds:.sodium chloride, acetaminophen **OR** acetaminophen, albuterol, guaiFENesin, haloperidol lactate, loperamide, ondansetron **OR** ondansetron (ZOFRAN) IV  Micro Results Recent Results (from the past 240 hour(s))  Blood culture (routine x 2)     Status: None   Collection Time: 07/02/18 12:07 PM  Result Value Ref Range Status   Specimen Description   Final    RIGHT ANTECUBITAL BOTTLES DRAWN AEROBIC AND ANAEROBIC   Special Requests Blood Culture adequate volume  Final   Culture   Final    NO GROWTH 5 DAYS Performed at [facility], [address] [place], [place] [address]    Report Status 07/07/2018 FINAL  Final  Urine culture     Status: Abnormal   Collection Time:  07/02/18  2:51 PM  Result Value Ref Range Status   Specimen Description   Final    URINE, CLEAN CATCH Performed at [facility], [address], [place], [place] [address]    Special Requests   Final    NONE Performed at [facility], [address] [place], [place] [address]    Culture (A)  Final    <10,000 COLONIES/mL INSIGNIFICANT GROWTH Performed at [facility], [place] [address] [place], [place] [address]    Report Status 07/04/2018 FINAL  Final  MRSA PCR Screening     Status: None   Collection Time: 07/03/18  9:40 AM  Result Value Ref Range Status   MRSA by PCR NEGATIVE NEGATIVE Final    Comment:        The GeneXpert MRSA Assay (FDA approved for NASAL specimens only), is one component of a comprehensive MRSA colonization surveillance program. It is not intended to diagnose MRSA infection nor to guide or monitor treatment for MRSA infections. Performed at [facility], [address], [place],  [address]     Radiology Reports Dg Chest 2 View  Result Date: 07/02/2018 CLINICAL DATA:  Cough.  Fever. EXAM: CHEST - 2 VIEW COMPARISON:  April 16, 2018 FINDINGS: New infiltrates seen in the right base. No other definitive infiltrates are noted. No pneumothorax. The cardiomediastinal silhouette is stable. IMPRESSION: New right basilar and middle lobe infiltrate consistent with pneumonia given history. Recommend treatment with short-term follow-up to ensure resolution. Electronically Signed   By: Dorise Bullion III M.D   On: 07/02/2018 13:13   Dg Swallowing Func-speech Pathology  Result Date: 07/04/2018 Objective Swallowing Evaluation: Type of Study: MBS-Modified Barium Swallow Study  Patient Details Name: [name] MRN: [id number] Date of Birth: 1923-09-08 Today's Date: 07/04/2018 Time: SLP Start Time (ACUTE ONLY): 7322 -SLP Stop Time (ACUTE ONLY): 1304 SLP Time Calculation (min) (ACUTE ONLY): 28 min Past Medical History: Past Medical History: Diagnosis Date . Acute  bronchitis  . Arthritis  . Atrial fibrillation ([place])   Dr. Harl Bowie- [facility] follows saw 11'14 . Bladder stones   tx. with oral meds and antibiotics, now surgery planned . Cataracts, both eyes   surgery planned May 2015 . Diabetes mellitus without complication ([place])   Type II . Dyspnea   with activity . Dysrhythmia   Afib . Family history of breast cancer  . Family history of colon cancer  . GERD (gastroesophageal reflux disease)  . GIST (gastrointestinal stromal tumor), malignant ([place]) 2005  Gastrointestinal stromal tumor, that is GIST, small bowel, 4.5 cm, intermediate prognostic grade found on the PET scan in the small bowel, accounting for that small bowel activity in October 2005 with resection by Dr. Margot Chimes, thus far without recurrence.  Marland Kitchen  Hypertension  . NHL (non-Hodgkin's lymphoma) ([place]) 2005  Diffuse large B-cell lymphoma, clinically stage IIIA, CD20 positive, status post cervical lymph node biopsy 08/03/2003 on the left. PET scan was also positive in the spleen and small bowel region, but bone marrow aspiration and biopsy were negative. So he essentially had stage IIIAs. He received R-CHOP x6 cycles with CR established by PET scan criteria on 11/23/2003 with no evidence for relapse th . PAD (peripheral artery disease) ([place]) 08/27/2017 . Skin cancer   Basal cell- face,head, neck,  arms, legs, Back Past Surgical History: Past Surgical History: Procedure Laterality Date . AMPUTATION Left 06/29/2017  Procedure: AMPUTATION LEFT GREAT TOE;  Surgeon: [name], MD;  Location: [place];  Service: Vascular;  Laterality: Left; . AMPUTATION Left 08/27/2017  Procedure: AMPUTATION Left SECOND TOE;  Surgeon: [name], MD;  Location: [place];  Service: Vascular;  Laterality: Left; . CATARACT EXTRACTION Bilateral 2015 . CHOLECYSTECTOMY   . COLON RESECTION    small bowel . CYSTOSCOPY WITH LITHOLAPAXY N/A 08/10/2013  Procedure: CYSTOSCOPY WITH LITHOLAPAXY WITH [name];  Surgeon: [name], MD;  Location:  [facility];  Service: Urology;  Laterality: N/A; . ESOPHAGEAL DILATION N/A 02/27/2016  Procedure: ESOPHAGEAL DILATION;  Surgeon: [name], MD;  [facility];  Service: Endoscopy;  Laterality: N/A; . ESOPHAGOGASTRODUODENOSCOPY N/A 02/27/2016  Procedure: ESOPHAGOGASTRODUODENOSCOPY (EGD);  Surgeon: [name], MD;  [facility];  Service: Endoscopy;  Laterality: N/A;  12:00 . INCISION AND DRAINAGE PERIRECTAL ABSCESS   . LOWER EXTREMITY ANGIOGRAPHY N/A 06/25/2017  Procedure: LOWER EXTREMITY ANGIOGRAPHY;  Surgeon: [name], MD;  Location: [place] [facility];  Service: Cardiovascular;  Laterality: N/A; . LYMPH NODE DISSECTION Left   '05-neck . PORT-A-CATH REMOVAL   . PORTACATH PLACEMENT    insertion and removal -last chemotherapy 10 yrs ago . TRANSURETHRAL RESECTION OF PROSTATE N/A 08/10/2013  Procedure: TRANSURETHRAL RESECTION OF THE PROSTATE WITH GYRUS INSTRUMENTS;  Surgeon: [name], MD;  Location: [facility];  Service: Urology;  Laterality: N/A; HPI: 85 year old male with metastatic prostate cancer, A. fib on Eliquis, type 2 diabetes mellitus, NHL completed cycle of R-CHOP without relapse, peripheral vascular disease, hypertension, chronic diarrhea and GERD with 2 prior hospitalization in the past 6 months with pneumonia and concern for aspiration brought from home with increased urinary frequency for past 5 days and 1 day of cough with chills, congestion and shortness of breath. Patient found to be in early sepsis secondary to UTI and lobar pneumonia. ST to complete BSE  No data recorded Assessment / Plan / Recommendation [name] 07/04/2018 Clinical Impression Pt presents with normal oropharyngeal phase of swallow and suspected esophageal dysphagia characterized by min delayed oral transit with solids and piecemeal deglutition, swallow trigger at the level of the valleculae across textures and consistencies (except for with straw sips), and min decreased  tongue base retraction resulting in trace base of tongue residuals post swallow which clear with repeat swallow. Pt had a single episode of trace penetration of thins when taking sequential straw sips of thin, however no aspiration observed. The barium tablet became delayed in the distal esophagus and never passed through the UES despite Pt being given extra solids, liquids, and extra time. Pt with history of esophageal dilation with Dr. Laural Golden in the past and may benefit from an outpatient appointment with him. Pt was given puree bolus in attempt to clear the pill, however backflow of bolus was noted to the level of  the cervical esophagus. Pt reportedly with several bouts of PNA in the past few months and question whether related to esophageal dysphagia. Reflux precautions and results of MBSS were reviewed with the Pt and his daughter. Recommend D3/mech soft and thin liquids, swallow two times for each sip if using a straw due to hand tremor, consume more frequent/smaller meals, and remain upright for at least 30 minutes following meals.  SLP Visit Diagnosis Dysphagia, oropharyngeal phase (R13.12) Attention and concentration deficit following -- Frontal lobe and executive function deficit following -- Impact on safety and function Mild aspiration risk   [name] IP TREATMENT RECOMMENDATION 07/04/2018 Treatment Recommendations No treatment recommended at this time   Prognosis 07/04/2018 Prognosis for Safe Diet Advancement Good Barriers to Reach Goals Cognitive deficits Barriers/Prognosis Comment -- [name] IP DIET RECOMMENDATION 07/04/2018 SLP Diet Recommendations Dysphagia 3 (Mech soft) solids;Thin liquid Liquid Administration via Cup;Straw Medication Administration Whole meds with liquid Compensations Multiple dry swallows after each bite/sip Postural Changes Remain semi-upright after after feeds/meals (Comment);Seated upright at 90 degrees   [name] IP OTHER RECOMMENDATIONS 07/04/2018 Recommended Consults Consider esophageal  assessment Oral Care Recommendations Oral care BID;Staff/trained caregiver to provide oral care Other Recommendations Clarify dietary restrictions   [name] IP FOLLOW UP RECOMMENDATIONS 07/04/2018 Follow up Recommendations None;24 hour supervision/assistance   [name] IP FREQUENCY AND DURATION 07/04/2018 Speech Therapy Frequency (ACUTE ONLY) min 1 x/week Treatment Duration 1 week      [name] IP ORAL PHASE 07/04/2018 Oral Phase Impaired Oral - Pudding Teaspoon -- Oral - Pudding Cup -- Oral - Honey Teaspoon -- Oral - Honey Cup -- Oral - Nectar Teaspoon -- Oral - Nectar Cup -- Oral - Nectar Straw -- Oral - Thin Teaspoon -- Oral - Thin Cup -- Oral - Thin Straw -- Oral - Puree Piecemeal swallowing Oral - Mech Soft -- Oral - Regular Piecemeal swallowing;Delayed oral transit Oral - Multi-Consistency -- Oral - Pill -- Oral Phase - Comment --  [name] IP PHARYNGEAL PHASE 07/04/2018 Pharyngeal Phase Impaired Pharyngeal- Pudding Teaspoon -- Pharyngeal -- Pharyngeal- Pudding Cup -- Pharyngeal -- Pharyngeal- Honey Teaspoon -- Pharyngeal -- Pharyngeal- Honey Cup -- Pharyngeal -- Pharyngeal- Nectar Teaspoon -- Pharyngeal -- Pharyngeal- Nectar Cup -- Pharyngeal -- Pharyngeal- Nectar Straw -- Pharyngeal -- Pharyngeal- Thin Teaspoon Delayed swallow initiation-vallecula;Reduced tongue base retraction Pharyngeal -- Pharyngeal- Thin Cup Delayed swallow initiation-vallecula;Reduced tongue base retraction Pharyngeal -- Pharyngeal- Thin Straw Delayed swallow initiation-vallecula;Delayed swallow initiation-pyriform sinuses;Penetration/Aspiration during swallow;Reduced tongue base retraction;Pharyngeal residue - valleculae Pharyngeal Material does not enter airway;Material enters airway, remains ABOVE vocal cords then ejected out Pharyngeal- Puree Delayed swallow initiation-vallecula Pharyngeal -- Pharyngeal- Mechanical Soft -- Pharyngeal -- Pharyngeal- Regular Delayed swallow initiation-vallecula Pharyngeal -- Pharyngeal- Multi-consistency -- Pharyngeal --  Pharyngeal- Pill WFL Pharyngeal -- Pharyngeal Comment --  [name] IP CERVICAL ESOPHAGEAL PHASE 07/04/2018 Cervical Esophageal Phase Impaired Pudding Teaspoon -- Pudding Cup -- Honey Teaspoon -- Honey Cup -- Nectar Teaspoon -- Nectar Cup -- Nectar Straw -- Thin Teaspoon -- Thin Cup -- Thin Straw -- Puree -- Mechanical Soft -- Regular -- Multi-consistency -- Pill -- Cervical Esophageal Comment prominent cricopharyngeus, barium tablet remained in distal esophagus, some backflow of bolus from distal esophagus to cervical esophagus Thank you, Genene Churn, [place] PORTER,DABNEY 07/04/2018, 5:28 PM               Time Spent in minutes  25   [name] M.D on 07/07/2018 at 7:25 PM  After 7pm go to [email or web address]    [facility] -  Office  [phone]     

## 2018-07-07 NOTE — Care Management (Signed)
Daughter [name], notified of bed offers from [facility] and [facility]. She will discuss with family.

## 2018-07-07 NOTE — Op Note (Signed)
[facility] Patient Name: [name] Procedure Date: 07/07/2018 2:16 PM MRN: [id number] Date of Birth: Sep 14, 1923 Attending MD: Hildred Laser , MD CSN: [id number] Age: 85 Admit Type: Inpatient Procedure:                Upper GI endoscopy Indications:              Esophageal dysphagia, Abnormal cine-esophagram Providers:                Hildred Laser, MD, Otis Peak B. Sharon Seller, RN, Raphael Gibney, Randa Spike, Technician Referring MD:             Kathie Dike, MD Medicines:                Lidocaine jelly, Midazolam 2 mg IV Complications:            No immediate complications. Estimated Blood Loss:     Estimated blood loss was minimal. Procedure:                Pre-Anesthesia Assessment:                           - Prior to the procedure, a History and Physical                            was performed, and patient medications and                            allergies were reviewed. The patient's tolerance of                            previous anesthesia was also reviewed. The risks                            and benefits of the procedure and the sedation                            options and risks were discussed with the patient.                            All questions were answered, and informed consent                            was obtained. Prior Anticoagulants: The patient                            last took Eliquis (apixaban) 2 days prior to the                            procedure. ASA Grade Assessment: III - A patient                            with severe systemic disease. After reviewing the  risks and benefits, the patient was deemed in                            satisfactory condition to undergo the procedure.                           After obtaining informed consent, the endoscope was                            passed under direct vision. Throughout the                            procedure, the patient's blood  pressure, pulse, and                            oxygen saturations were monitored continuously. The                            GIF-H190 ([id number]) scope was introduced through the                            mouth, and advanced to the second part of duodenum.                            The upper GI endoscopy was accomplished without                            difficulty. The patient tolerated the procedure                            well. Scope In: 2:41:04 PM Scope Out: 2:50:34 PM Total Procedure Duration: 0 hours 9 minutes 30 seconds  Findings:      Abnormal motility was noted in the esophagus. The cricopharyngeus was       abnormal.      A non-obstructing Schatzki ring was found at the gastroesophageal       junction. A TTS dilator was passed through the scope. Dilation with a       15-16.5-18 mm balloon dilator was performed to 18 mm. The dilation site       was examined and showed no change and no bleeding, mucosal tear or       perforation. Ring disrupted with focal biopsy but no tissue saved.      A 4 cm hiatal hernia was present.      The entire examined stomach was normal.      The duodenal bulb and second portion of the duodenum were normal. Impression:               - Abnormal esophageal motility.                           - Non-obstructing Schatzki ring. Dilated with                            balloon but still intact. Therefore disrupted with  focal biopsy at three points.                           - 4 cm hiatal hernia.                           - Normal stomach.                           - Normal duodenal bulb and second portion of the                            duodenum.                           - No specimens collected. Moderate Sedation:      Moderate (conscious) sedation was administered by the endoscopy nurse       and supervised by the endoscopist. The following parameters were       monitored: oxygen saturation, heart rate, blood pressure,  CO2       capnography and response to care. Total physician intraservice time was       13 minutes. Recommendation:           - Return patient to hospital ward for ongoing care.                           - Mechanical soft diet today.                           - Continue present medications.                           - Resume Eliquis (apixaban) at prior dose in 24                            hours. Procedure Code(s):        --- Professional ---                           [phone], Esophagogastroduodenoscopy, flexible,                            transoral; with transendoscopic balloon dilation of                            esophagus (less than 30 mm diameter)                           G0500, Moderate sedation services provided by the                            same physician or other qualified health care                            professional performing a gastrointestinal  endoscopic service that sedation supports,                            requiring the presence of an independent trained                            observer to assist in the monitoring of the                            patient's level of consciousness and physiological                            status; initial 15 minutes of intra-service time;                            patient age 60 years or older (additional time may                            be reported with [phone], as appropriate) Diagnosis Code(s):        --- Professional ---                           K22.4, Dyskinesia of esophagus                           K22.2, Esophageal obstruction                           K44.9, Diaphragmatic hernia without obstruction or                            gangrene                           R13.14, Dysphagia, pharyngoesophageal phase                           R93.3, Abnormal findings on diagnostic imaging of                            other parts of digestive tract CPT copyright 2018 [employer]  Association. All rights reserved. The codes documented in this report are preliminary and upon coder review may  be revised to meet current compliance requirements. Hildred Laser, MD Hildred Laser, MD 07/07/2018 3:25:24 PM This report has been signed electronically. Number of Addenda: 0

## 2018-07-07 NOTE — Progress Notes (Signed)
Brief EGD note.  Abnormal esophageal motility. Noncritical Schatzki's ring.  Dilated with balloon up to 18 mm but still intact and disruptive with focal biopsy but no tissue saved. 4 cm size sliding hiatal hernia. Normal examination of stomach first and second part of duodenum.

## 2018-07-08 ENCOUNTER — Encounter (HOSPITAL_COMMUNITY): Payer: Self-pay | Admitting: Internal Medicine

## 2018-07-08 DIAGNOSIS — C8590 Non-Hodgkin lymphoma, unspecified, unspecified site: Secondary | ICD-10-CM

## 2018-07-08 DIAGNOSIS — I10 Essential (primary) hypertension: Secondary | ICD-10-CM

## 2018-07-08 DIAGNOSIS — C61 Malignant neoplasm of prostate: Secondary | ICD-10-CM

## 2018-07-08 LAB — BASIC METABOLIC PANEL
Anion gap: 8 (ref 5–15)
BUN: 11 mg/dL (ref 8–23)
CO2: 26 mmol/L (ref 22–32)
Calcium: 8.2 mg/dL — ABNORMAL LOW (ref 8.9–10.3)
Chloride: 105 mmol/L (ref 98–111)
Creatinine, Ser: 0.65 mg/dL (ref 0.61–1.24)
GFR calc Af Amer: >60 mL/min (ref >60)
GFR calc non Af Amer: >60 mL/min (ref >60)
Glucose, Bld: 161 mg/dL — ABNORMAL HIGH (ref 70–99)
Potassium: 3.3 mmol/L — ABNORMAL LOW (ref 3.5–5.1)
Sodium: 139 mmol/L (ref 135–145)

## 2018-07-08 LAB — GLUCOSE, CAPILLARY
Glucose-Capillary: 164 mg/dL — ABNORMAL HIGH (ref 70–99)
Glucose-Capillary: 197 mg/dL — ABNORMAL HIGH (ref 70–99)

## 2018-07-08 MED ORDER — ALBUTEROL SULFATE HFA 108 (90 BASE) MCG/ACT IN AERS: 2.0000 | INHALATION_SPRAY | Freq: Four times a day (QID) | RESPIRATORY_TRACT | 2 refills | Status: DC | PRN

## 2018-07-08 MED ORDER — DILTIAZEM HCL 60 MG PO TABS: 60.0000 mg | ORAL_TABLET | Freq: Two times a day (BID) | ORAL | 0 refills | Status: DC

## 2018-07-08 NOTE — Clinical Social Work Note (Signed)
Confirmed with Dr that pt is set to d/c today, and Dr confirmed that he would put in order for [facility] nursing and PT.

## 2018-07-08 NOTE — Progress Notes (Signed)
Patient reports that he had no difficulty swallowing pills today. According to his daughter [name] he ate most of his breakfast and did not eat fish at lunch.  He had no problem swallowing. Patient is going to nursing home for rehab.

## 2018-07-08 NOTE — Discharge Instructions (Signed)
SLP Diet Recommendations: Dysphagia 2 (Chopped) solids;Thin liquid   Liquid Administration via: Cup;Straw   Medication Administration: Whole meds with liquid   Supervision: Staff to assist with self feeding;Intermittent supervision to cue for compensatory strategies   Compensations: Multiple dry swallows after each bite/sip   Postural Changes: Remain semi-upright after after feeds/meals (for 30 mins);Seated upright at 90 degrees

## 2018-07-08 NOTE — Discharge Summary (Signed)
Physician Discharge Summary  JAKOBE BLAU MVE:[id number] DOB: [birth year] DOA: 07/02/2018  PCP: Rosita Fire, MD  Admit date: 07/02/2018 Discharge date: 07/08/2018  Admitted From: Home Disposition: Home  Recommendations for Outpatient Follow-up:  1. Follow up with PCP in 1-2 weeks 2. Please obtain BMP/CBC in one week 3. Please follow up on the following pending results:  Home Health: Home health PT, RN Equipment/Devices:  Discharge Condition: Stable CODE STATUS: Full code Diet recommendation: Heart healthy, carb modified  Brief/Interim Summary: 85 year old male with a history of metastatic prostate cancer, A. fib on Eliquis, diabetes, non-Hodgkin's lymphoma status post chemotherapy with a relapse, presented to the hospital with recurrent pneumonia and concerns for aspiration.  He was found to have lobar pneumonia and UTI with early sepsis and admitted to the hospital for further treatment.  Discharge Diagnoses:  Principal Problem:   Sepsis secondary to UTI [facility]) Active Problems:   Essential hypertension   ATRIAL FIBRILLATION   Long term (current) use of anticoagulants   NHL (non-Hodgkin's lymphoma) (HCC)   GIST (gastrointestinal stromal tumor), malignant (HCC)   Prostate cancer (HCC)   Polyneuropathy   Neuropathy associated with MGUS (HCC)   Type 2 diabetes mellitus (HCC)   PAD (peripheral artery disease) ([place])   Acute metabolic encephalopathy   HCAP (healthcare-associated pneumonia)  1. sepsis.  Secondary to UTI and healthcare associated pneumonia.  Patient was treated with intravenous fluids and antibiotics.  Blood cultures are shown no growth.  Leukocytosis resolved.  Hemodynamics have stabilized. 2. UTI secondary to enterococcus faecalis.  Treated with intravenous vancomycin.  He is no longer febrile. 3. Healthcare associated pneumonia/aspiration pneumonia.  Patient has significant dysphasia.  He was treated with intravenous antibiotics.  Influenza panel was found to be  negative.  Patient is now afebrile. 4. Dysphagia.  Seen by speech therapy.  He underwent modified barium swallow.  He is been placed on a modified diet.  It was felt that he also had an esophageal component.  He was seen by GI and underwent endoscopy.  He was noted to have dysmotility throughout his esophagus and a noncritical Schatzki's ring.  Esophagus was dilated up to 18 mm. 5. Dementia with acute delirium.  He did have some episodes of sundowning in the hospital, but mental status remained stable at the time of discharge. 6. Paroxysmal atrial fibrillation.  Rate controlled on Cardizem.  Eliquis was briefly held for endoscopy, but has been resumed on discharge 7. Metastatic prostate cancer and non-Hodgkin's lymphoma.  Continue to follow-up with oncology.  Continue prednisone and Zytiga.   Discharge Instructions  Discharge Instructions    Diet - low sodium heart healthy   Complete by:  As directed    Increase activity slowly   Complete by:  As directed      Allergies as of 07/08/2018      Reactions   Tape Other (See Comments)   SKIN IS VERY THIN AND WILL TEAR AND BRUISE VERY EASILY!!!!   Augmentin [amoxicillin-pot Clavulanate] Rash, Other (See Comments)   Redness of skin      Medication List    STOP taking these medications   warfarin 2.5 MG tablet Commonly known as:  COUMADIN     TAKE these medications   abiraterone acetate 250 MG tablet Commonly known as:  ZYTIGA Take 2 tablets (500 mg total) by mouth daily. Take on an empty stomach 1 hour before or 2 hours after a meal   acetaminophen 500 MG tablet Commonly known as:  TYLENOL Take 500-1,000  mg by mouth every 8 (eight) hours as needed for mild pain, fever or headache.   albuterol 108 (90 Base) MCG/ACT inhaler Commonly known as:  PROVENTIL HFA;VENTOLIN HFA Inhale 2 puffs into the lungs every 6 (six) hours as needed for wheezing or shortness of breath.   Calcium 600+D 600-800 MG-UNIT Tabs Generic drug:  Calcium  Carb-Cholecalciferol Take 1 tablet by mouth daily.   Cranberry Plus Vitamin C 4200-20-3 MG-MG-UNIT Caps Generic drug:  Cranberry-Vitamin C-Vitamin E Take 1 capsule by mouth every morning.   denosumab 120 MG/1.7ML Soln injection Commonly known as:  XGEVA Inject 120 mg into the skin every 30 (thirty) days.   diltiazem 60 MG tablet Commonly known as:  CARDIZEM Take 1 tablet (60 mg total) by mouth 2 (two) times daily. What changed:    medication strength  how much to take  how to take this  when to take this  additional instructions   donepezil 10 MG tablet Commonly known as:  ARICEPT Take 10 mg by mouth daily.   Eliquis 5 MG Tabs tablet Generic drug:  apixaban Take 5 mg by mouth 2 (two) times daily.   feeding supplement (ENSURE ENLIVE) Liqd Take 237 mLs by mouth daily. CHOCOLATE   FIBER-CAPS PO Take 1 capsule by mouth 2 (two) times daily.   fluticasone 50 MCG/ACT nasal spray Commonly known as:  FLONASE Place 2 sprays into both nostrils daily.   furosemide 20 MG tablet Commonly known as:  Lasix Take 1 tablet (20 mg total) by mouth daily.   glipiZIDE 5 MG 24 hr tablet Commonly known as:  GLUCOTROL XL Take 5 mg by mouth daily with breakfast.   guaiFENesin 600 MG 12 hr tablet Commonly known as:  MUCINEX Take 600 mg by mouth 2 (two) times daily as needed for to loosen phlegm.   lisinopril 20 MG tablet Commonly known as:  PRINIVIL,ZESTRIL Take 1 tablet (20 mg total) by mouth daily.   loperamide 2 MG capsule Commonly known as:  IMODIUM Take 1 capsule (2 mg total) by mouth as needed for diarrhea or loose stools.   Lupron Depot (42-Month) 45 MG injection Generic drug:  Leuprolide Acetate (6 Month) Inject 45 mg into the muscle every 6 (six) months.   metFORMIN 850 MG tablet Commonly known as:  GLUCOPHAGE Take 850 mg by mouth 2 (two) times daily with a meal.   mirtazapine 7.5 MG tablet Commonly known as:  REMERON Take 7.5 mg by mouth at bedtime.    multivitamin with minerals Tabs tablet Take 1 tablet by mouth every morning.   nystatin powder Commonly known as:  MYCOSTATIN/NYSTOP Apply 1 g topically 4 (four) times daily as needed (fungus).   omeprazole 40 MG capsule Commonly known as:  PRILOSEC Take 40 mg by mouth every other day.   potassium chloride 10 MEQ tablet Commonly known as:  K-DUR,KLOR-CON Take 2 tablets (20 mEq total) by mouth daily. What changed:    how much to take  when to take this   predniSONE 5 MG tablet Commonly known as:  DELTASONE Take 0.5 tablets (2.5 mg total) by mouth 2 (two) times daily.   TYLENOL PM EXTRA STRENGTH PO Take 1 tablet by mouth at bedtime.   VITAMIN B-12 IJ Inject 1,000 mcg as directed every 30 (thirty) days.   Vitamin D3 25 MCG (1000 UT) Caps Take 1,000 Units by mouth every morning.      Follow-up Information    Rosita Fire, MD. Go in 6 days.   Specialty:  Internal Medicine Why:  10:10 am on March 24th for hospital follow up Contact information: [address] [place] [address] [phone]          Allergies  Allergen Reactions  . Tape Other (See Comments)    SKIN IS VERY THIN AND WILL TEAR AND BRUISE VERY EASILY!!!!  . Augmentin [Amoxicillin-Pot Clavulanate] Rash and Other (See Comments)    Redness of skin    Consultations:  Gastroenterology   Procedures/Studies: Dg Chest 2 View  Result Date: 07/02/2018 CLINICAL DATA:  Cough.  Fever. EXAM: CHEST - 2 VIEW COMPARISON:  April 16, 2018 FINDINGS: New infiltrates seen in the right base. No other definitive infiltrates are noted. No pneumothorax. The cardiomediastinal silhouette is stable. IMPRESSION: New right basilar and middle lobe infiltrate consistent with pneumonia given history. Recommend treatment with short-term follow-up to ensure resolution. Electronically Signed   By: Dorise Bullion III M.D   On: 07/02/2018 13:13   Dg Swallowing Func-speech Pathology  Result Date:  07/04/2018 Objective Swallowing Evaluation: Type of Study: MBS-Modified Barium Swallow Study  Patient Details Name: PETERSON MATHEY MRN: [id number] Date of Birth: March 11, 1924 Today's Date: 07/04/2018 Time: SLP Start Time (ACUTE ONLY): 7510 -SLP Stop Time (ACUTE ONLY): 1304 SLP Time Calculation (min) (ACUTE ONLY): 28 min Past Medical History: Past Medical History: Diagnosis Date . Acute bronchitis  . Arthritis  . Atrial fibrillation ([place])   Dr. Harl Bowie- [place] follows saw 11'14 . Bladder stones   tx. with oral meds and antibiotics, now surgery planned . Cataracts, both eyes   surgery planned May 2015 . Diabetes mellitus without complication ([place])   Type II . Dyspnea   with activity . Dysrhythmia   Afib . Family history of breast cancer  . Family history of colon cancer  . GERD (gastroesophageal reflux disease)  . GIST (gastrointestinal stromal tumor), malignant ([place]) 2005  Gastrointestinal stromal tumor, that is GIST, small bowel, 4.5 cm, intermediate prognostic grade found on the PET scan in the small bowel, accounting for that small bowel activity in October 2005 with resection by Dr. Margot Chimes, thus far without recurrence.  . Hypertension  . NHL (non-Hodgkin's lymphoma) ([place]) 2005  Diffuse large B-cell lymphoma, clinically stage IIIA, CD20 positive, status post cervical lymph node biopsy 08/03/2003 on the left. PET scan was also positive in the spleen and small bowel region, but bone marrow aspiration and biopsy were negative. So he essentially had stage IIIAs. He received R-CHOP x6 cycles with CR established by PET scan criteria on 11/23/2003 with no evidence for relapse th . PAD (peripheral artery disease) ([place]) 08/27/2017 . Skin cancer   Basal cell- face,head, neck,  arms, legs, Back Past Surgical History: Past Surgical History: Procedure Laterality Date . AMPUTATION Left 06/29/2017  Procedure: AMPUTATION LEFT GREAT TOE;  Surgeon: [name], MD;  Location: [facility];  Service: Vascular;  Laterality: Left; .  AMPUTATION Left 08/27/2017  Procedure: AMPUTATION Left SECOND TOE;  Surgeon: [name], MD;  Location: [facility];  Service: Vascular;  Laterality: Left; . CATARACT EXTRACTION Bilateral 2015 . CHOLECYSTECTOMY   . COLON RESECTION    small bowel . CYSTOSCOPY WITH LITHOLAPAXY N/A 08/10/2013  Procedure: CYSTOSCOPY WITH LITHOLAPAXY WITH [name];  Surgeon: [name], MD;  [facility];  Service: Urology;  Laterality: N/A; . ESOPHAGEAL DILATION N/A 02/27/2016  Procedure: ESOPHAGEAL DILATION;  Surgeon: [name], MD;  [facility];  Service: Endoscopy;  Laterality: N/A; . ESOPHAGOGASTRODUODENOSCOPY N/A 02/27/2016  Procedure: ESOPHAGOGASTRODUODENOSCOPY (EGD);  Surgeon:  [name], MD;  [facility];  Service: Endoscopy;  Laterality: N/A;  12:00 . INCISION AND DRAINAGE PERIRECTAL ABSCESS   . LOWER EXTREMITY ANGIOGRAPHY N/A 06/25/2017  Procedure: LOWER EXTREMITY ANGIOGRAPHY;  Surgeon: [name], MD;  Location: [facility];  Service: Cardiovascular;  Laterality: N/A; . LYMPH NODE DISSECTION Left   '05-neck . PORT-A-CATH REMOVAL   . PORTACATH PLACEMENT    insertion and removal -last chemotherapy 10 yrs ago . TRANSURETHRAL RESECTION OF PROSTATE N/A 08/10/2013  Procedure: TRANSURETHRAL RESECTION OF THE PROSTATE WITH GYRUS INSTRUMENTS;  Surgeon: [name], MD;  [facility];  Service: Urology;  Laterality: N/A; HPI: 85 year old male with metastatic prostate cancer, A. fib on Eliquis, type 2 diabetes mellitus, NHL completed cycle of R-CHOP without relapse, peripheral vascular disease, hypertension, chronic diarrhea and GERD with 2 prior hospitalization in the past 6 months with pneumonia and concern for aspiration brought from home with increased urinary frequency for past 5 days and 1 day of cough with chills, congestion and shortness of breath. Patient found to be in early sepsis secondary to UTI and lobar pneumonia. ST to complete BSE  No data recorded  Assessment / Plan / Recommendation CHL IP CLINICAL IMPRESSIONS 07/04/2018 Clinical Impression Pt presents with normal oropharyngeal phase of swallow and suspected esophageal dysphagia characterized by min delayed oral transit with solids and piecemeal deglutition, swallow trigger at the level of the valleculae across textures and consistencies (except for with straw sips), and min decreased tongue base retraction resulting in trace base of tongue residuals post swallow which clear with repeat swallow. Pt had a single episode of trace penetration of thins when taking sequential straw sips of thin, however no aspiration observed. The barium tablet became delayed in the distal esophagus and never passed through the UES despite Pt being given extra solids, liquids, and extra time. Pt with history of esophageal dilation with Dr. Laural Golden in the past and may benefit from an outpatient appointment with him. Pt was given puree bolus in attempt to clear the pill, however backflow of bolus was noted to the level of the cervical esophagus. Pt reportedly with several bouts of PNA in the past few months and question whether related to esophageal dysphagia. Reflux precautions and results of MBSS were reviewed with the Pt and his daughter. Recommend D3/mech soft and thin liquids, swallow two times for each sip if using a straw due to hand tremor, consume more frequent/smaller meals, and remain upright for at least 30 minutes following meals.  SLP Visit Diagnosis Dysphagia, oropharyngeal phase (R13.12) Attention and concentration deficit following -- Frontal lobe and executive function deficit following -- Impact on safety and function Mild aspiration risk   CHL IP TREATMENT RECOMMENDATION 07/04/2018 Treatment Recommendations No treatment recommended at this time   Prognosis 07/04/2018 Prognosis for Safe Diet Advancement Good Barriers to Reach Goals Cognitive deficits Barriers/Prognosis Comment -- CHL IP DIET RECOMMENDATION 07/04/2018  SLP Diet Recommendations Dysphagia 3 (Mech soft) solids;Thin liquid Liquid Administration via Cup;Straw Medication Administration Whole meds with liquid Compensations Multiple dry swallows after each bite/sip Postural Changes Remain semi-upright after after feeds/meals (Comment);Seated upright at 90 degrees   CHL IP OTHER RECOMMENDATIONS 07/04/2018 Recommended Consults Consider esophageal assessment Oral Care Recommendations Oral care BID;Staff/trained caregiver to provide oral care Other Recommendations Clarify dietary restrictions   CHL IP FOLLOW UP RECOMMENDATIONS 07/04/2018 Follow up Recommendations None;24 hour supervision/assistance   CHL IP FREQUENCY AND DURATION 07/04/2018 Speech Therapy Frequency (ACUTE ONLY) min 1 x/week Treatment  Duration 1 week      CHL IP ORAL PHASE 07/04/2018 Oral Phase Impaired Oral - Pudding Teaspoon -- Oral - Pudding Cup -- Oral - Honey Teaspoon -- Oral - Honey Cup -- Oral - Nectar Teaspoon -- Oral - Nectar Cup -- Oral - Nectar Straw -- Oral - Thin Teaspoon -- Oral - Thin Cup -- Oral - Thin Straw -- Oral - Puree Piecemeal swallowing Oral - Mech Soft -- Oral - Regular Piecemeal swallowing;Delayed oral transit Oral - Multi-Consistency -- Oral - Pill -- Oral Phase - Comment --  CHL IP PHARYNGEAL PHASE 07/04/2018 Pharyngeal Phase Impaired Pharyngeal- Pudding Teaspoon -- Pharyngeal -- Pharyngeal- Pudding Cup -- Pharyngeal -- Pharyngeal- Honey Teaspoon -- Pharyngeal -- Pharyngeal- Honey Cup -- Pharyngeal -- Pharyngeal- Nectar Teaspoon -- Pharyngeal -- Pharyngeal- Nectar Cup -- Pharyngeal -- Pharyngeal- Nectar Straw -- Pharyngeal -- Pharyngeal- Thin Teaspoon Delayed swallow initiation-vallecula;Reduced tongue base retraction Pharyngeal -- Pharyngeal- Thin Cup Delayed swallow initiation-vallecula;Reduced tongue base retraction Pharyngeal -- Pharyngeal- Thin Straw Delayed swallow initiation-vallecula;Delayed swallow initiation-pyriform sinuses;Penetration/Aspiration during swallow;Reduced tongue  base retraction;Pharyngeal residue - valleculae Pharyngeal Material does not enter airway;Material enters airway, remains ABOVE vocal cords then ejected out Pharyngeal- Puree Delayed swallow initiation-vallecula Pharyngeal -- Pharyngeal- Mechanical Soft -- Pharyngeal -- Pharyngeal- Regular Delayed swallow initiation-vallecula Pharyngeal -- Pharyngeal- Multi-consistency -- Pharyngeal -- Pharyngeal- Pill WFL Pharyngeal -- Pharyngeal Comment --  CHL IP CERVICAL ESOPHAGEAL PHASE 07/04/2018 Cervical Esophageal Phase Impaired Pudding Teaspoon -- Pudding Cup -- Honey Teaspoon -- Honey Cup -- Nectar Teaspoon -- Nectar Cup -- Nectar Straw -- Thin Teaspoon -- Thin Cup -- Thin Straw -- Puree -- Mechanical Soft -- Regular -- Multi-consistency -- Pill -- Cervical Esophageal Comment prominent cricopharyngeus, barium tablet remained in distal esophagus, some backflow of bolus from distal esophagus to cervical esophagus Thank you, Genene Churn, Naturita PORTER,DABNEY 07/04/2018, 5:28 PM                  Subjective: Feeling better.  Denies any shortness of breath.  No cough.  Discharge Exam: Vitals:   07/07/18 2006 07/07/18 2246 07/08/18 0556 07/08/18 0735  BP:  (!) 163/85 (!) 159/99   Pulse:  86 69   Resp:  20 20   Temp:  99.1 F (37.3 C) 98.2 F (36.8 C)   TempSrc:  Oral Oral   SpO2: 98% 100% 100% 99%  Weight:      Height:        General: Pt is alert, awake, not in acute distress Cardiovascular: RRR, S1/S2 +, no rubs, no gallops Respiratory: CTA bilaterally, no wheezing, no rhonchi Abdominal: Soft, NT, ND, bowel sounds + Extremities: no edema, no cyanosis    The results of significant diagnostics from this hospitalization (including imaging, microbiology, ancillary and laboratory) are listed below for reference.     Microbiology: Recent Results (from the past 240 hour(s))  Blood culture (routine x 2)     Status: None   Collection Time: 07/02/18 12:07 PM  Result Value Ref Range  Status   Specimen Description   Final    RIGHT ANTECUBITAL BOTTLES DRAWN AEROBIC AND ANAEROBIC   Special Requests Blood Culture adequate volume  Final   Culture   Final    NO GROWTH 5 DAYS Performed at [facility], [address], [place], [place] [address]    Report Status 07/07/2018 FINAL  Final  Urine culture     Status: Abnormal   Collection Time: 07/02/18  2:51 PM  Result Value Ref Range Status  Specimen Description   Final    URINE, CLEAN CATCH Performed at [facility], [address], [place], [place] [address]    Special Requests   Final    NONE Performed at [facility], [address] [place], [place] [address]    Culture (A)  Final    <10,000 COLONIES/mL INSIGNIFICANT GROWTH Performed at [facility] [address] [place], [place] [address]    Report Status 07/04/2018 FINAL  Final  MRSA PCR Screening     Status: None   Collection Time: 07/03/18  9:40 AM  Result Value Ref Range Status   MRSA by PCR NEGATIVE NEGATIVE Final    Comment:        The GeneXpert MRSA Assay (FDA approved for NASAL specimens only), is one component of a comprehensive MRSA colonization surveillance program. It is not intended to diagnose MRSA infection nor to guide or monitor treatment for MRSA infections. Performed at [facility], [address], [place],  [address]      Labs: BNP (last 3 results) No results for input(s): BNP in the last 8760 hours. Basic Metabolic Panel: Recent Labs  Lab 07/02/18 1159 07/03/18 0651 07/05/18 0457 07/06/18 0451 07/08/18 0404  NA 139 139 136 137 139  K 3.5 3.2* 3.4* 3.1* 3.3*  CL 102 108 107 105 105  CO2 28 22 21* 24 26  GLUCOSE 141* 94 176* 202* 161*  BUN 19 22 18 13 11   CREATININE 0.87 0.77 0.75 0.64 0.65  CALCIUM 10.0 8.9 8.9 8.6* 8.2*   Liver Function Tests: No results for input(s): AST, ALT, ALKPHOS, BILITOT, PROT, ALBUMIN in the last 168 hours. No results for input(s): LIPASE, AMYLASE in the last 168 hours. No  results for input(s): AMMONIA in the last 168 hours. CBC: Recent Labs  Lab 07/02/18 1159 07/03/18 0651 07/04/18 0514 07/05/18 0457 07/06/18 0451  WBC 13.1* 17.3* 9.4 6.3 5.8  HGB 14.1 12.3* 10.2* 11.1* 10.3*  HCT 40.9 35.2* 31.0* 31.7* 29.7*  MCV 91.7 92.1 96.6 92.4 91.1  PLT 155 136* 113* 127* 139*   Cardiac Enzymes: No results for input(s): CKTOTAL, CKMB, CKMBINDEX, TROPONINI in the last 168 hours. BNP: Invalid input(s): POCBNP CBG: Recent Labs  Lab 07/07/18 1114 07/07/18 1632 07/07/18 2242 07/08/18 0725 07/08/18 1112  GLUCAP 207* 87 213* 164* 197*   D-Dimer No results for input(s): DDIMER in the last 72 hours. Hgb A1c No results for input(s): HGBA1C in the last 72 hours. Lipid Profile No results for input(s): CHOL, HDL, LDLCALC, TRIG, CHOLHDL, LDLDIRECT in the last 72 hours. Thyroid function studies No results for input(s): TSH, T4TOTAL, T3FREE, THYROIDAB in the last 72 hours.  Invalid input(s): FREET3 Anemia work up No results for input(s): VITAMINB12, FOLATE, FERRITIN, TIBC, IRON, RETICCTPCT in the last 72 hours. Urinalysis    Component Value Date/Time   COLORURINE YELLOW 07/02/2018 1451   APPEARANCEUR CLEAR 07/02/2018 1451   LABSPEC 1.015 07/02/2018 1451   PHURINE 5.0 07/02/2018 1451   GLUCOSEU NEGATIVE 07/02/2018 1451   HGBUR MODERATE (A) 07/02/2018 1451   BILIRUBINUR NEGATIVE 07/02/2018 1451   KETONESUR NEGATIVE 07/02/2018 1451   PROTEINUR NEGATIVE 07/02/2018 1451   NITRITE NEGATIVE 07/02/2018 1451   LEUKOCYTESUR NEGATIVE 07/02/2018 1451   Sepsis Labs Invalid input(s): PROCALCITONIN,  WBC,  LACTICIDVEN Microbiology Recent Results (from the past 240 hour(s))  Blood culture (routine x 2)     Status: None   Collection Time: 07/02/18 12:07 PM  Result Value Ref Range Status  Specimen Description   Final    RIGHT ANTECUBITAL BOTTLES DRAWN AEROBIC AND ANAEROBIC   Special Requests Blood Culture adequate volume  Final   Culture   Final    NO GROWTH 5  DAYS Performed at [facility], [address], [place], [place] [address]    Report Status 07/07/2018 FINAL  Final  Urine culture     Status: Abnormal   Collection Time: 07/02/18  2:51 PM  Result Value Ref Range Status   Specimen Description   Final    URINE, CLEAN CATCH Performed at [facility], [address], [place], [place] [address]    Special Requests   Final    NONE Performed at [facility], [address] [place], [place] [address]    Culture (A)  Final    <10,000 COLONIES/mL INSIGNIFICANT GROWTH Performed at [facility] [address] [place], [place] [address]    Report Status 07/04/2018 FINAL  Final  MRSA PCR Screening     Status: None   Collection Time: 07/03/18  9:40 AM  Result Value Ref Range Status   MRSA by PCR NEGATIVE NEGATIVE Final    Comment:        The GeneXpert MRSA Assay (FDA approved for NASAL specimens only), is one component of a comprehensive MRSA colonization surveillance program. It is not intended to diagnose MRSA infection nor to guide or monitor treatment for MRSA infections. Performed at [facility], [address] [place], [place] [address]      Time coordinating discharge: 51mins  SIGNED:   Kathie Dike, MD  [facility] 07/08/2018, 8:25 PM   If 7PM-7AM, please contact night-coverage [email or web address]

## 2018-07-08 NOTE — Progress Notes (Signed)
Removed both IVs-clean, dry, intact. Reviewed d/c paperwork with patient and daughter. Reviewed new medications and medication changes. Answered all questions. Wheeled stable patient and belongings to short stay entrance where he was picked up by his daughter to d/c to home

## 2018-07-11 ENCOUNTER — Other Ambulatory Visit: Payer: Self-pay

## 2018-07-11 DIAGNOSIS — C61 Malignant neoplasm of prostate: Secondary | ICD-10-CM | POA: Diagnosis not present

## 2018-07-11 DIAGNOSIS — C7951 Secondary malignant neoplasm of bone: Secondary | ICD-10-CM | POA: Diagnosis not present

## 2018-07-11 DIAGNOSIS — F0390 Unspecified dementia without behavioral disturbance: Secondary | ICD-10-CM | POA: Diagnosis not present

## 2018-07-11 DIAGNOSIS — E1151 Type 2 diabetes mellitus with diabetic peripheral angiopathy without gangrene: Secondary | ICD-10-CM | POA: Diagnosis not present

## 2018-07-11 DIAGNOSIS — I4891 Unspecified atrial fibrillation: Secondary | ICD-10-CM | POA: Diagnosis not present

## 2018-07-11 DIAGNOSIS — Z7984 Long term (current) use of oral hypoglycemic drugs: Secondary | ICD-10-CM | POA: Diagnosis not present

## 2018-07-11 NOTE — Patient Outreach (Signed)
[facility]) Care Management  07/11/2018  [name] 12-Jun-1923 [id number]    EMMI-General Discharge RED ON EMMI ALERT Day # 1 Date: 07/10/2018 Red Alert Reason: " Scheduled follow up? No"   Outreach attempt # 1 to patient. Spoke with patient's daughter-[name]. She voices that she has been answering automated calls for patient. Reviewed and addressed red alert with daughter. She voices that they have not made MD follow up appt yet due to virus outbreak and concerns about going on. Per discharge summary PCP appt prior to discharge and for 07/12/2018. RN CM advised daughter of this who was unaware. She has discharge paperwork in the home but voices she had only looked at it for med list. Daughter states she will contact MD office to see if MD wants patient to keep appt or reschedule. She denies any issues with transportation or meds. She voices that patient has a reddened area to his bottom. She is applying cream and repositioning him often. RN CM encouraged these practices and also encouraged increased protein intake to aide in healing and nutrition. Daughter voices that patient's appetite is slowly improving and he is drinking nutritional supplements. Patient scheduled to have [facility]. Agency has not contacted them but daughter has info to follow up if she does not hear from them within the next 24 hrs. Advised daughter that they would get one more automated EMMI-GENERAL post discharge calls to assess how they are doing following recent hospitalization and will receive a call from a nurse if any of their responses were abnormal. She voiced understanding and was appreciative of f/u call.   Plan: RN CM will close case as no further interventions needed at this time.  Enzo Montgomery, RN,BSN,CCM [facility] Telephonic Care Management Coordinator Direct Phone: [phone] Toll Free: [phone] Fax: [phone]

## 2018-07-12 DIAGNOSIS — Z7984 Long term (current) use of oral hypoglycemic drugs: Secondary | ICD-10-CM | POA: Diagnosis not present

## 2018-07-12 DIAGNOSIS — C7951 Secondary malignant neoplasm of bone: Secondary | ICD-10-CM | POA: Diagnosis not present

## 2018-07-12 DIAGNOSIS — R410 Disorientation, unspecified: Secondary | ICD-10-CM | POA: Diagnosis not present

## 2018-07-12 DIAGNOSIS — J189 Pneumonia, unspecified organism: Secondary | ICD-10-CM | POA: Diagnosis not present

## 2018-07-12 DIAGNOSIS — E1151 Type 2 diabetes mellitus with diabetic peripheral angiopathy without gangrene: Secondary | ICD-10-CM | POA: Diagnosis not present

## 2018-07-12 DIAGNOSIS — N390 Urinary tract infection, site not specified: Secondary | ICD-10-CM | POA: Diagnosis not present

## 2018-07-12 DIAGNOSIS — E1165 Type 2 diabetes mellitus with hyperglycemia: Secondary | ICD-10-CM | POA: Diagnosis not present

## 2018-07-12 DIAGNOSIS — C61 Malignant neoplasm of prostate: Secondary | ICD-10-CM | POA: Diagnosis not present

## 2018-07-12 DIAGNOSIS — F0390 Unspecified dementia without behavioral disturbance: Secondary | ICD-10-CM | POA: Diagnosis not present

## 2018-07-12 DIAGNOSIS — A419 Sepsis, unspecified organism: Secondary | ICD-10-CM | POA: Diagnosis not present

## 2018-07-12 DIAGNOSIS — I4891 Unspecified atrial fibrillation: Secondary | ICD-10-CM | POA: Diagnosis not present

## 2018-07-14 DIAGNOSIS — E1151 Type 2 diabetes mellitus with diabetic peripheral angiopathy without gangrene: Secondary | ICD-10-CM | POA: Diagnosis not present

## 2018-07-14 DIAGNOSIS — F0390 Unspecified dementia without behavioral disturbance: Secondary | ICD-10-CM | POA: Diagnosis not present

## 2018-07-14 DIAGNOSIS — I4891 Unspecified atrial fibrillation: Secondary | ICD-10-CM | POA: Diagnosis not present

## 2018-07-14 DIAGNOSIS — C61 Malignant neoplasm of prostate: Secondary | ICD-10-CM | POA: Diagnosis not present

## 2018-07-14 DIAGNOSIS — Z7984 Long term (current) use of oral hypoglycemic drugs: Secondary | ICD-10-CM | POA: Diagnosis not present

## 2018-07-14 DIAGNOSIS — C7951 Secondary malignant neoplasm of bone: Secondary | ICD-10-CM | POA: Diagnosis not present

## 2018-07-18 DIAGNOSIS — I10 Essential (primary) hypertension: Secondary | ICD-10-CM | POA: Diagnosis not present

## 2018-07-18 DIAGNOSIS — C61 Malignant neoplasm of prostate: Secondary | ICD-10-CM | POA: Diagnosis not present

## 2018-07-18 DIAGNOSIS — I4891 Unspecified atrial fibrillation: Secondary | ICD-10-CM | POA: Diagnosis not present

## 2018-07-18 DIAGNOSIS — R1310 Dysphagia, unspecified: Secondary | ICD-10-CM | POA: Diagnosis not present

## 2018-07-18 DIAGNOSIS — M150 Primary generalized (osteo)arthritis: Secondary | ICD-10-CM | POA: Diagnosis not present

## 2018-07-18 DIAGNOSIS — Z8572 Personal history of non-Hodgkin lymphomas: Secondary | ICD-10-CM | POA: Diagnosis not present

## 2018-07-18 DIAGNOSIS — Z7901 Long term (current) use of anticoagulants: Secondary | ICD-10-CM | POA: Diagnosis not present

## 2018-07-18 DIAGNOSIS — L89151 Pressure ulcer of sacral region, stage 1: Secondary | ICD-10-CM | POA: Diagnosis not present

## 2018-07-18 DIAGNOSIS — J189 Pneumonia, unspecified organism: Secondary | ICD-10-CM | POA: Diagnosis not present

## 2018-07-18 DIAGNOSIS — C7951 Secondary malignant neoplasm of bone: Secondary | ICD-10-CM | POA: Diagnosis not present

## 2018-07-18 DIAGNOSIS — G9340 Encephalopathy, unspecified: Secondary | ICD-10-CM | POA: Diagnosis not present

## 2018-07-18 DIAGNOSIS — Z7952 Long term (current) use of systemic steroids: Secondary | ICD-10-CM | POA: Diagnosis not present

## 2018-07-18 DIAGNOSIS — Z89422 Acquired absence of other left toe(s): Secondary | ICD-10-CM | POA: Diagnosis not present

## 2018-07-18 DIAGNOSIS — F0390 Unspecified dementia without behavioral disturbance: Secondary | ICD-10-CM | POA: Diagnosis not present

## 2018-07-18 DIAGNOSIS — R2681 Unsteadiness on feet: Secondary | ICD-10-CM | POA: Diagnosis not present

## 2018-07-18 DIAGNOSIS — Z5181 Encounter for therapeutic drug level monitoring: Secondary | ICD-10-CM | POA: Diagnosis not present

## 2018-07-18 DIAGNOSIS — Z66 Do not resuscitate: Secondary | ICD-10-CM | POA: Diagnosis not present

## 2018-07-18 DIAGNOSIS — M6281 Muscle weakness (generalized): Secondary | ICD-10-CM | POA: Diagnosis not present

## 2018-07-18 DIAGNOSIS — E1151 Type 2 diabetes mellitus with diabetic peripheral angiopathy without gangrene: Secondary | ICD-10-CM | POA: Diagnosis not present

## 2018-07-18 DIAGNOSIS — Z8509 Personal history of malignant neoplasm of other digestive organs: Secondary | ICD-10-CM | POA: Diagnosis not present

## 2018-07-18 DIAGNOSIS — N390 Urinary tract infection, site not specified: Secondary | ICD-10-CM | POA: Diagnosis not present

## 2018-07-18 DIAGNOSIS — Z89412 Acquired absence of left great toe: Secondary | ICD-10-CM | POA: Diagnosis not present

## 2018-07-18 DIAGNOSIS — Z7984 Long term (current) use of oral hypoglycemic drugs: Secondary | ICD-10-CM | POA: Diagnosis not present

## 2018-07-19 DIAGNOSIS — C61 Malignant neoplasm of prostate: Secondary | ICD-10-CM | POA: Diagnosis not present

## 2018-07-19 DIAGNOSIS — C7951 Secondary malignant neoplasm of bone: Secondary | ICD-10-CM | POA: Diagnosis not present

## 2018-07-19 DIAGNOSIS — I4891 Unspecified atrial fibrillation: Secondary | ICD-10-CM | POA: Diagnosis not present

## 2018-07-19 DIAGNOSIS — J189 Pneumonia, unspecified organism: Secondary | ICD-10-CM | POA: Diagnosis not present

## 2018-07-19 DIAGNOSIS — N390 Urinary tract infection, site not specified: Secondary | ICD-10-CM | POA: Diagnosis not present

## 2018-07-19 DIAGNOSIS — F0390 Unspecified dementia without behavioral disturbance: Secondary | ICD-10-CM | POA: Diagnosis not present

## 2018-07-25 DIAGNOSIS — C61 Malignant neoplasm of prostate: Secondary | ICD-10-CM | POA: Diagnosis not present

## 2018-07-25 DIAGNOSIS — N390 Urinary tract infection, site not specified: Secondary | ICD-10-CM | POA: Diagnosis not present

## 2018-07-25 DIAGNOSIS — F0390 Unspecified dementia without behavioral disturbance: Secondary | ICD-10-CM | POA: Diagnosis not present

## 2018-07-25 DIAGNOSIS — I4891 Unspecified atrial fibrillation: Secondary | ICD-10-CM | POA: Diagnosis not present

## 2018-07-25 DIAGNOSIS — C7951 Secondary malignant neoplasm of bone: Secondary | ICD-10-CM | POA: Diagnosis not present

## 2018-07-25 DIAGNOSIS — J189 Pneumonia, unspecified organism: Secondary | ICD-10-CM | POA: Diagnosis not present

## 2018-07-27 DIAGNOSIS — I4891 Unspecified atrial fibrillation: Secondary | ICD-10-CM | POA: Diagnosis not present

## 2018-07-27 DIAGNOSIS — N390 Urinary tract infection, site not specified: Secondary | ICD-10-CM | POA: Diagnosis not present

## 2018-07-27 DIAGNOSIS — F0390 Unspecified dementia without behavioral disturbance: Secondary | ICD-10-CM | POA: Diagnosis not present

## 2018-07-27 DIAGNOSIS — C7951 Secondary malignant neoplasm of bone: Secondary | ICD-10-CM | POA: Diagnosis not present

## 2018-07-27 DIAGNOSIS — C61 Malignant neoplasm of prostate: Secondary | ICD-10-CM | POA: Diagnosis not present

## 2018-07-27 DIAGNOSIS — J189 Pneumonia, unspecified organism: Secondary | ICD-10-CM | POA: Diagnosis not present

## 2018-07-28 DIAGNOSIS — C7951 Secondary malignant neoplasm of bone: Secondary | ICD-10-CM | POA: Diagnosis not present

## 2018-07-28 DIAGNOSIS — I4891 Unspecified atrial fibrillation: Secondary | ICD-10-CM | POA: Diagnosis not present

## 2018-07-28 DIAGNOSIS — C61 Malignant neoplasm of prostate: Secondary | ICD-10-CM | POA: Diagnosis not present

## 2018-07-28 DIAGNOSIS — F0390 Unspecified dementia without behavioral disturbance: Secondary | ICD-10-CM | POA: Diagnosis not present

## 2018-07-28 DIAGNOSIS — N390 Urinary tract infection, site not specified: Secondary | ICD-10-CM | POA: Diagnosis not present

## 2018-07-28 DIAGNOSIS — J189 Pneumonia, unspecified organism: Secondary | ICD-10-CM | POA: Diagnosis not present

## 2018-07-29 ENCOUNTER — Telehealth: Payer: Medicare Other | Admitting: Family

## 2018-07-29 DIAGNOSIS — R300 Dysuria: Secondary | ICD-10-CM

## 2018-07-29 NOTE — Progress Notes (Signed)
We are sorry that you are not feeling well.  Here is how we plan to help!  Male bladder infections are not very common.  We worry about prostate or kidney conditions.  The standard of care is to examine the abdomen and kidneys, and to do a urine and blood test to make sure that something more serious is not going on.   NOTE: If you entered your credit card information for this eVisit, you will not be charged. You may see a "hold" on your card for the $35 but that hold will drop off and you will not have a charge processed.  We recommend that you see a provider today.  If your doctor's office is closed [place] has the following Urgent Cares:  If you need care fast and have a high deductible or no insurance consider:  [email or web address] to reserve your spot online an avoid wait times  [facility] [address], [address] [place], [place] [address] Modified hours of operation: Monday-Friday, 10 AM to 6 PM  Saturday & Sunday 10 AM to 4 PM  [facility] (New Address!) [address], [place], [place] [address] *Just off [address], across the road from [place] hours of operation: Monday-Friday, 10 AM to 5 PM  Closed Saturday & Sunday  [facility]'s modified hours of operation will be in effect from Wednesday, April 1st through Thursday, April 30th.   The following sites will take your  insurance:  . [facility] [place] a Provider at this Location  [address] [place], [place] [address] . 10 am to 8 pm Monday-Friday . 12 pm to 8 pm Saturday-Sunday   . [facility] at [place] a Provider at this Location  [place] [address], [place], [place] [address] . 8 am to 8 pm Monday-Friday . 9 am to 6 pm Saturday . 11 am to 6 pm Sunday   . [facility] at [place] Get Driving Directions  [address] [place], [place] [address] . 8 am to 8 pm Monday-Friday . 8 am to 4 pm Saturday-Sunday   Your e-visit answers were reviewed by a board certified advanced clinical practitioner to complete your personal care plan.  Thank you for using e-Visits.

## 2018-08-01 ENCOUNTER — Ambulatory Visit (HOSPITAL_COMMUNITY): Payer: Medicare Other | Admitting: Hematology

## 2018-08-01 ENCOUNTER — Encounter: Payer: Self-pay | Admitting: *Deleted

## 2018-08-01 ENCOUNTER — Ambulatory Visit (HOSPITAL_COMMUNITY): Payer: Medicare Other

## 2018-08-01 ENCOUNTER — Other Ambulatory Visit: Payer: Self-pay | Admitting: *Deleted

## 2018-08-01 ENCOUNTER — Other Ambulatory Visit (HOSPITAL_COMMUNITY): Payer: Medicare Other

## 2018-08-01 DIAGNOSIS — N390 Urinary tract infection, site not specified: Secondary | ICD-10-CM | POA: Diagnosis not present

## 2018-08-01 NOTE — Patient Outreach (Signed)
[facility]) Care Management  08/01/2018  [name] 07-27-1923 [id number]  CSW was able to make initial contact with patient and patient's daughter, [name] today to perform the phone assessment on patient, as well as assess and assist with social work needs and services.  CSW introduced self, explained role and types of services provided through [facility] ([facility]).  CSW further explained to patient and [name] that CSW works with patient's Primary Care Physician, [name], also with [facility]. CSW then explained the reason for the call, indicating that Dr. Legrand Foster thought that patient would benefit from social work services and resources to assist with counseling and supportive services for symptoms of depression.  CSW obtained two HIPAA compliant identifiers from patient and [name], which included patient's name and date of birth.  [name] reported that she is now living with both her parents, patient and his wife, [name], and has been since October of 2019.  [name] denies patient experiencing symptoms of depression, reporting, "I think his mood is appropriate, based on his age and health condition".  [name] went on to say that patient certainly does not like to be "cooped up" in the house, but currently does not have a choice due to Coronavirus restrictions.  [name] also indicated that patient and [name] have been driving to [place] in their camper for the past 20 years, over the winter months, but were unable to do so this year due to patient having Pneumonia.  [name] stated, "I think dad is just tired of being sick, he really has not felt well since November of 2018".  [name] indicated that she is living with patient and [name] to provide 24 hour care and supervision, as patient is experiencing memory deficits due to Dementia.  [name] reported that  she assists patient with all activities of daily living, because patient does not feel secure walking without assistance.  [name] admitted that patient experiences frequent diabetic foot ulcers, already having two toes amputated.  [name] is preparing meals for patient and [name] to ensure that they eat a well-balanced diet.  [name] has to convince patient to eat because he does not have much of an appetite, but he realizes that eating is of upmost importance, supplementing his food intake with protein shakes.    CSW offered to provide counseling and supportive services to patient, at least over the phone, until coronavirus restrictions have been lifted; however, [name] denied.  [name] admitted that patient is very hard of hearing and does not like to "discuss his business over the phone".  CSW also offered to provide patient with a list of counselors/therapists in the community, as well as assist with the referral process, but [name] did not feel that this would be necessary.  Last, CSW agreed to mail patient information pertaining specifically to "Signs and Symptoms of Depression", but again, [name] was not interested in receiving the information.  CSW will perform a case closure on patient, as all goals of treatment have been met from social work standpoint and no additional social work needs have been identified at this time.  CSW will fax an update to patient's Primary Care Physician, [name] to ensure that they are aware of CSW's involvement with patient's plan of care.  CSW was able to confirm that [name] has the correct contact information for  CSW, encouraging Mrs. Goosley to contact CSW directly if social work services are needed in the near future.  [name] voiced understanding and was agreeable to this plan.  [name] denied the need for nursing services, of any kind, indicating that patient was recently involved with [name],  RNCM back in November of 2019.  [name], [name], [name], [name]  Licensed Education officer, [facility]  Mailing [place] [address] [place], [place] [address] Physical Address-[address], [place], [place] [address] Toll Free Main # [phone] Fax # [phone] Cell # [phone]  Office # [phone] [name]

## 2018-08-02 DIAGNOSIS — J189 Pneumonia, unspecified organism: Secondary | ICD-10-CM | POA: Diagnosis not present

## 2018-08-02 DIAGNOSIS — N390 Urinary tract infection, site not specified: Secondary | ICD-10-CM | POA: Diagnosis not present

## 2018-08-02 DIAGNOSIS — C61 Malignant neoplasm of prostate: Secondary | ICD-10-CM | POA: Diagnosis not present

## 2018-08-02 DIAGNOSIS — F0390 Unspecified dementia without behavioral disturbance: Secondary | ICD-10-CM | POA: Diagnosis not present

## 2018-08-02 DIAGNOSIS — C7951 Secondary malignant neoplasm of bone: Secondary | ICD-10-CM | POA: Diagnosis not present

## 2018-08-02 DIAGNOSIS — I4891 Unspecified atrial fibrillation: Secondary | ICD-10-CM | POA: Diagnosis not present

## 2018-08-03 DIAGNOSIS — C61 Malignant neoplasm of prostate: Secondary | ICD-10-CM | POA: Diagnosis not present

## 2018-08-03 DIAGNOSIS — J189 Pneumonia, unspecified organism: Secondary | ICD-10-CM | POA: Diagnosis not present

## 2018-08-03 DIAGNOSIS — N390 Urinary tract infection, site not specified: Secondary | ICD-10-CM | POA: Diagnosis not present

## 2018-08-03 DIAGNOSIS — C7951 Secondary malignant neoplasm of bone: Secondary | ICD-10-CM | POA: Diagnosis not present

## 2018-08-03 DIAGNOSIS — I4891 Unspecified atrial fibrillation: Secondary | ICD-10-CM | POA: Diagnosis not present

## 2018-08-03 DIAGNOSIS — F0390 Unspecified dementia without behavioral disturbance: Secondary | ICD-10-CM | POA: Diagnosis not present

## 2018-08-04 ENCOUNTER — Telehealth: Payer: Self-pay | Admitting: Cardiology

## 2018-08-04 NOTE — Telephone Encounter (Signed)
He needs to coordinate with his pcp, his admission was not cardiac related nor are the labs that Dr Roderic Palau had ordered.    Zandra Abts MD

## 2018-08-04 NOTE — Telephone Encounter (Signed)
Has question about diltiazem (CARDIZEM) 60 MG tablet dose  He was due to have blood work.  Wants to know if he can have blood work done when he goes for his labwork for cancer center tomorrow at [facility]

## 2018-08-04 NOTE — Telephone Encounter (Signed)
Family says his diltiazem was decreased from 90 mg to 60 mg BID and they were not given a refillable prescription.Do you want him to stay on the 60 mg dose ?

## 2018-08-04 NOTE — Telephone Encounter (Signed)
Pt had labs 3 weeks ago by Dr.Memon. I don't see where we ordered any labs  Please clarify

## 2018-08-05 ENCOUNTER — Other Ambulatory Visit: Payer: Self-pay

## 2018-08-05 ENCOUNTER — Inpatient Hospital Stay (HOSPITAL_COMMUNITY): Payer: Medicare Other | Attending: Hematology

## 2018-08-05 ENCOUNTER — Inpatient Hospital Stay (HOSPITAL_COMMUNITY): Payer: Medicare Other

## 2018-08-05 ENCOUNTER — Inpatient Hospital Stay (HOSPITAL_BASED_OUTPATIENT_CLINIC_OR_DEPARTMENT_OTHER): Payer: Medicare Other | Admitting: Hematology

## 2018-08-05 ENCOUNTER — Encounter (HOSPITAL_COMMUNITY): Payer: Self-pay | Admitting: Hematology

## 2018-08-05 VITALS — BP 150/64 | HR 61 | Temp 97.5°F | Resp 18

## 2018-08-05 DIAGNOSIS — C61 Malignant neoplasm of prostate: Secondary | ICD-10-CM | POA: Insufficient documentation

## 2018-08-05 DIAGNOSIS — C7951 Secondary malignant neoplasm of bone: Secondary | ICD-10-CM | POA: Diagnosis not present

## 2018-08-05 DIAGNOSIS — Z79899 Other long term (current) drug therapy: Secondary | ICD-10-CM | POA: Insufficient documentation

## 2018-08-05 DIAGNOSIS — D472 Monoclonal gammopathy: Secondary | ICD-10-CM | POA: Diagnosis not present

## 2018-08-05 LAB — COMPREHENSIVE METABOLIC PANEL
ALT: 12 U/L (ref 0–44)
AST: 16 U/L (ref 15–41)
Albumin: 3.8 g/dL (ref 3.5–5.0)
Alkaline Phosphatase: 66 U/L (ref 38–126)
Anion gap: 8 (ref 5–15)
BUN: 19 mg/dL (ref 8–23)
CO2: 25 mmol/L (ref 22–32)
Calcium: 10.6 mg/dL — ABNORMAL HIGH (ref 8.9–10.3)
Chloride: 105 mmol/L (ref 98–111)
Creatinine, Ser: 0.71 mg/dL (ref 0.61–1.24)
GFR calc Af Amer: >60 mL/min (ref >60)
GFR calc non Af Amer: >60 mL/min (ref >60)
Glucose, Bld: 185 mg/dL — ABNORMAL HIGH (ref 70–99)
Potassium: 4.1 mmol/L (ref 3.5–5.1)
Sodium: 138 mmol/L (ref 135–145)
Total Bilirubin: 1.1 mg/dL (ref 0.3–1.2)
Total Protein: 6.8 g/dL (ref 6.5–8.1)

## 2018-08-05 LAB — CBC WITH DIFFERENTIAL/PLATELET
Abs Immature Granulocytes: 0.26 10*3/uL — ABNORMAL HIGH (ref 0.00–0.07)
Basophils Absolute: 0 10*3/uL (ref 0.0–0.1)
Basophils Relative: 0 %
Eosinophils Absolute: 0.1 10*3/uL (ref 0.0–0.5)
Eosinophils Relative: 1 %
HCT: 35.6 % — ABNORMAL LOW (ref 39.0–52.0)
Hemoglobin: 12.2 g/dL — ABNORMAL LOW (ref 13.0–17.0)
Immature Granulocytes: 4 %
Lymphocytes Relative: 28 %
Lymphs Abs: 1.9 10*3/uL (ref 0.7–4.0)
MCH: 31.5 pg (ref 26.0–34.0)
MCHC: 34.3 g/dL (ref 30.0–36.0)
MCV: 92 fL (ref 80.0–100.0)
Monocytes Absolute: 1 10*3/uL (ref 0.1–1.0)
Monocytes Relative: 14 %
Neutro Abs: 3.7 10*3/uL (ref 1.7–7.7)
Neutrophils Relative %: 53 %
Platelets: 145 10*3/uL — ABNORMAL LOW (ref 150–400)
RBC: 3.87 MIL/uL — ABNORMAL LOW (ref 4.22–5.81)
RDW: 13.2 % (ref 11.5–15.5)
WBC: 6.9 10*3/uL (ref 4.0–10.5)
nRBC: 0 % (ref 0.0–0.2)

## 2018-08-05 LAB — PSA: Prostatic Specific Antigen: <0.01 ng/mL (ref 0.00–4.00)

## 2018-08-05 MED ORDER — DENOSUMAB 120 MG/1.7ML ~~LOC~~ SOLN
120.0000 mg | Freq: Once | SUBCUTANEOUS | Status: AC
  Administered 2018-08-05: 120 mg via SUBCUTANEOUS
  Filled 2018-08-05: qty 1.7

## 2018-08-05 MED ORDER — DILTIAZEM HCL 60 MG PO TABS: 60.0000 mg | ORAL_TABLET | Freq: Two times a day (BID) | ORAL | 3 refills | Status: DC

## 2018-08-05 NOTE — Progress Notes (Signed)
Virtual Visit via Telephone Note  I connected with [name] on 08/05/18 at 10:15 AM EDT by telephone and verified that I am speaking with the correct person using two identifiers.   I discussed the limitations, risks, security and privacy concerns of performing an evaluation and management service by telephone and the availability of in person appointments. I also discussed with the patient that there may be a patient responsible charge related to this service. The patient expressed understanding and agreed to proceed.   History of Present Illness: 1.  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  IgM MGUS  3.  DLBCL  4.  GIST  5.  Recent hospital admission from 07/02/2018 through 07/08/2018 secondary to sepsis and UTI.    Observations/Objective: He had one episode of hematuria on Monday.  He was diagnosed with UTI and was started on Macrobid.  Denies any new onset pains.  Taking calcium 1 tablet daily.  He is also taking 2 tablets of Zytiga and prednisone 2.5 mg twice daily.  Denies any major hot flashes.  Denies any fevers or chills.  Assessment and Plan: 1.  Metastatic prostate cancer: - Last PSA in February was undetectable.  PSA from today is pending.  He will continue Abiraterone 500 mg daily along with prednisone 2.5 mg twice daily.  We will follow-up on the PSA from today.  2.  Bone strengthening: -His calcium is 10.6 today.  He is taking calcium 1 tablet daily.  He will receive his denosumab injection.  3.  IgM MGUS: -Last M spike was 0.3 g/dL.  Hemoglobin, creatinine were within normal limits.  Slightly elevated calcium is secondary to calcium intake. - M spike levels from today are pending.   Follow Up Instructions: RTC in 2 months with blood work.   I discussed the assessment and treatment plan with the patient. The patient was provided an opportunity to ask questions and all were answered. The patient agreed with the plan and demonstrated an understanding of  the instructions.   The patient was advised to call back or seek an in-person evaluation if the symptoms worsen or if the condition fails to improve as anticipated.  I provided 15 minutes of non-face-to-face time during this encounter.   [name], MD

## 2018-08-05 NOTE — Progress Notes (Signed)
[name] presents today for injection per MD orders. XGeva administered SQ in left Upper Arm. Administration without incident. Patient tolerated well.

## 2018-08-05 NOTE — Telephone Encounter (Signed)
Family notified that Dr.Branch agrees with diltiazem dose change, e-scribed refill

## 2018-08-05 NOTE — Telephone Encounter (Signed)
Ok to refill the changed dose of diltiazem 60mg  bid   Zandra Abts MD

## 2018-08-08 LAB — PROTEIN ELECTROPHORESIS, SERUM
A/G Ratio: 1.2 (ref 0.7–1.7)
Albumin ELP: 3.4 g/dL (ref 2.9–4.4)
Alpha-1-Globulin: 0.3 g/dL (ref 0.0–0.4)
Alpha-2-Globulin: 0.8 g/dL (ref 0.4–1.0)
Beta Globulin: 0.8 g/dL (ref 0.7–1.3)
Gamma Globulin: 1 g/dL (ref 0.4–1.8)
Globulin, Total: 2.9 g/dL (ref 2.2–3.9)
M-Spike, %: 0.4 g/dL — ABNORMAL HIGH
Total Protein ELP: 6.3 g/dL (ref 6.0–8.5)

## 2018-08-10 DIAGNOSIS — F0390 Unspecified dementia without behavioral disturbance: Secondary | ICD-10-CM | POA: Diagnosis not present

## 2018-08-10 DIAGNOSIS — J189 Pneumonia, unspecified organism: Secondary | ICD-10-CM | POA: Diagnosis not present

## 2018-08-10 DIAGNOSIS — N390 Urinary tract infection, site not specified: Secondary | ICD-10-CM | POA: Diagnosis not present

## 2018-08-10 DIAGNOSIS — I4891 Unspecified atrial fibrillation: Secondary | ICD-10-CM | POA: Diagnosis not present

## 2018-08-10 DIAGNOSIS — C7951 Secondary malignant neoplasm of bone: Secondary | ICD-10-CM | POA: Diagnosis not present

## 2018-08-10 DIAGNOSIS — C61 Malignant neoplasm of prostate: Secondary | ICD-10-CM | POA: Diagnosis not present

## 2018-08-12 DIAGNOSIS — N390 Urinary tract infection, site not specified: Secondary | ICD-10-CM | POA: Diagnosis not present

## 2018-08-12 DIAGNOSIS — C61 Malignant neoplasm of prostate: Secondary | ICD-10-CM | POA: Diagnosis not present

## 2018-08-12 DIAGNOSIS — F0390 Unspecified dementia without behavioral disturbance: Secondary | ICD-10-CM | POA: Diagnosis not present

## 2018-08-12 DIAGNOSIS — J189 Pneumonia, unspecified organism: Secondary | ICD-10-CM | POA: Diagnosis not present

## 2018-08-12 DIAGNOSIS — C7951 Secondary malignant neoplasm of bone: Secondary | ICD-10-CM | POA: Diagnosis not present

## 2018-08-12 DIAGNOSIS — I4891 Unspecified atrial fibrillation: Secondary | ICD-10-CM | POA: Diagnosis not present

## 2018-08-15 DIAGNOSIS — C61 Malignant neoplasm of prostate: Secondary | ICD-10-CM | POA: Diagnosis not present

## 2018-08-15 DIAGNOSIS — J189 Pneumonia, unspecified organism: Secondary | ICD-10-CM | POA: Diagnosis not present

## 2018-08-15 DIAGNOSIS — N390 Urinary tract infection, site not specified: Secondary | ICD-10-CM | POA: Diagnosis not present

## 2018-08-15 DIAGNOSIS — F0390 Unspecified dementia without behavioral disturbance: Secondary | ICD-10-CM | POA: Diagnosis not present

## 2018-08-15 DIAGNOSIS — C7951 Secondary malignant neoplasm of bone: Secondary | ICD-10-CM | POA: Diagnosis not present

## 2018-08-15 DIAGNOSIS — I4891 Unspecified atrial fibrillation: Secondary | ICD-10-CM | POA: Diagnosis not present

## 2018-08-17 DIAGNOSIS — R2681 Unsteadiness on feet: Secondary | ICD-10-CM | POA: Diagnosis not present

## 2018-08-17 DIAGNOSIS — Z89412 Acquired absence of left great toe: Secondary | ICD-10-CM | POA: Diagnosis not present

## 2018-08-17 DIAGNOSIS — C61 Malignant neoplasm of prostate: Secondary | ICD-10-CM | POA: Diagnosis not present

## 2018-08-17 DIAGNOSIS — Z66 Do not resuscitate: Secondary | ICD-10-CM | POA: Diagnosis not present

## 2018-08-17 DIAGNOSIS — F0390 Unspecified dementia without behavioral disturbance: Secondary | ICD-10-CM | POA: Diagnosis not present

## 2018-08-17 DIAGNOSIS — G9340 Encephalopathy, unspecified: Secondary | ICD-10-CM | POA: Diagnosis not present

## 2018-08-17 DIAGNOSIS — Z7952 Long term (current) use of systemic steroids: Secondary | ICD-10-CM | POA: Diagnosis not present

## 2018-08-17 DIAGNOSIS — M150 Primary generalized (osteo)arthritis: Secondary | ICD-10-CM | POA: Diagnosis not present

## 2018-08-17 DIAGNOSIS — I10 Essential (primary) hypertension: Secondary | ICD-10-CM | POA: Diagnosis not present

## 2018-08-17 DIAGNOSIS — C7951 Secondary malignant neoplasm of bone: Secondary | ICD-10-CM | POA: Diagnosis not present

## 2018-08-17 DIAGNOSIS — M6281 Muscle weakness (generalized): Secondary | ICD-10-CM | POA: Diagnosis not present

## 2018-08-17 DIAGNOSIS — Z8572 Personal history of non-Hodgkin lymphomas: Secondary | ICD-10-CM | POA: Diagnosis not present

## 2018-08-17 DIAGNOSIS — I4891 Unspecified atrial fibrillation: Secondary | ICD-10-CM | POA: Diagnosis not present

## 2018-08-17 DIAGNOSIS — R1310 Dysphagia, unspecified: Secondary | ICD-10-CM | POA: Diagnosis not present

## 2018-08-17 DIAGNOSIS — Z7901 Long term (current) use of anticoagulants: Secondary | ICD-10-CM | POA: Diagnosis not present

## 2018-08-17 DIAGNOSIS — Z89422 Acquired absence of other left toe(s): Secondary | ICD-10-CM | POA: Diagnosis not present

## 2018-08-17 DIAGNOSIS — Z8509 Personal history of malignant neoplasm of other digestive organs: Secondary | ICD-10-CM | POA: Diagnosis not present

## 2018-08-17 DIAGNOSIS — N390 Urinary tract infection, site not specified: Secondary | ICD-10-CM | POA: Diagnosis not present

## 2018-08-17 DIAGNOSIS — E1151 Type 2 diabetes mellitus with diabetic peripheral angiopathy without gangrene: Secondary | ICD-10-CM | POA: Diagnosis not present

## 2018-08-17 DIAGNOSIS — Z7984 Long term (current) use of oral hypoglycemic drugs: Secondary | ICD-10-CM | POA: Diagnosis not present

## 2018-08-19 ENCOUNTER — Other Ambulatory Visit (HOSPITAL_COMMUNITY): Payer: Self-pay | Admitting: Nurse Practitioner

## 2018-08-19 DIAGNOSIS — N3021 Other chronic cystitis with hematuria: Secondary | ICD-10-CM | POA: Diagnosis not present

## 2018-08-19 DIAGNOSIS — C8580 Other specified types of non-Hodgkin lymphoma, unspecified site: Secondary | ICD-10-CM | POA: Diagnosis not present

## 2018-08-19 DIAGNOSIS — I4821 Permanent atrial fibrillation: Secondary | ICD-10-CM | POA: Diagnosis not present

## 2018-08-19 DIAGNOSIS — E1165 Type 2 diabetes mellitus with hyperglycemia: Secondary | ICD-10-CM | POA: Diagnosis not present

## 2018-08-19 DIAGNOSIS — N390 Urinary tract infection, site not specified: Secondary | ICD-10-CM | POA: Diagnosis not present

## 2018-08-22 DIAGNOSIS — E1151 Type 2 diabetes mellitus with diabetic peripheral angiopathy without gangrene: Secondary | ICD-10-CM | POA: Diagnosis not present

## 2018-08-22 DIAGNOSIS — E1142 Type 2 diabetes mellitus with diabetic polyneuropathy: Secondary | ICD-10-CM | POA: Diagnosis not present

## 2018-08-22 DIAGNOSIS — B351 Tinea unguium: Secondary | ICD-10-CM | POA: Diagnosis not present

## 2018-08-22 DIAGNOSIS — I4891 Unspecified atrial fibrillation: Secondary | ICD-10-CM | POA: Diagnosis not present

## 2018-08-22 DIAGNOSIS — F0390 Unspecified dementia without behavioral disturbance: Secondary | ICD-10-CM | POA: Diagnosis not present

## 2018-08-22 DIAGNOSIS — C7951 Secondary malignant neoplasm of bone: Secondary | ICD-10-CM | POA: Diagnosis not present

## 2018-08-22 DIAGNOSIS — N390 Urinary tract infection, site not specified: Secondary | ICD-10-CM | POA: Diagnosis not present

## 2018-08-22 DIAGNOSIS — C61 Malignant neoplasm of prostate: Secondary | ICD-10-CM | POA: Diagnosis not present

## 2018-08-26 DIAGNOSIS — N390 Urinary tract infection, site not specified: Secondary | ICD-10-CM | POA: Diagnosis not present

## 2018-08-26 DIAGNOSIS — C61 Malignant neoplasm of prostate: Secondary | ICD-10-CM | POA: Diagnosis not present

## 2018-08-30 DIAGNOSIS — I4891 Unspecified atrial fibrillation: Secondary | ICD-10-CM | POA: Diagnosis not present

## 2018-08-30 DIAGNOSIS — E1151 Type 2 diabetes mellitus with diabetic peripheral angiopathy without gangrene: Secondary | ICD-10-CM | POA: Diagnosis not present

## 2018-08-30 DIAGNOSIS — F0390 Unspecified dementia without behavioral disturbance: Secondary | ICD-10-CM | POA: Diagnosis not present

## 2018-08-30 DIAGNOSIS — C61 Malignant neoplasm of prostate: Secondary | ICD-10-CM | POA: Diagnosis not present

## 2018-08-30 DIAGNOSIS — N390 Urinary tract infection, site not specified: Secondary | ICD-10-CM | POA: Diagnosis not present

## 2018-08-30 DIAGNOSIS — C7951 Secondary malignant neoplasm of bone: Secondary | ICD-10-CM | POA: Diagnosis not present

## 2018-09-01 ENCOUNTER — Ambulatory Visit: Payer: Self-pay | Admitting: *Deleted

## 2018-09-02 ENCOUNTER — Other Ambulatory Visit (HOSPITAL_COMMUNITY): Payer: Self-pay | Admitting: *Deleted

## 2018-09-02 DIAGNOSIS — C61 Malignant neoplasm of prostate: Secondary | ICD-10-CM

## 2018-09-02 DIAGNOSIS — E538 Deficiency of other specified B group vitamins: Secondary | ICD-10-CM

## 2018-09-02 DIAGNOSIS — D472 Monoclonal gammopathy: Secondary | ICD-10-CM

## 2018-09-02 DIAGNOSIS — C7951 Secondary malignant neoplasm of bone: Secondary | ICD-10-CM

## 2018-09-05 ENCOUNTER — Inpatient Hospital Stay (HOSPITAL_COMMUNITY): Payer: Medicare Other | Attending: Hematology

## 2018-09-05 ENCOUNTER — Encounter (HOSPITAL_COMMUNITY): Payer: Self-pay

## 2018-09-05 ENCOUNTER — Inpatient Hospital Stay (HOSPITAL_COMMUNITY): Payer: Medicare Other

## 2018-09-05 ENCOUNTER — Other Ambulatory Visit: Payer: Self-pay

## 2018-09-05 VITALS — BP 123/86 | HR 68 | Temp 98.0°F | Resp 16

## 2018-09-05 DIAGNOSIS — E43 Unspecified severe protein-calorie malnutrition: Secondary | ICD-10-CM | POA: Diagnosis not present

## 2018-09-05 DIAGNOSIS — E538 Deficiency of other specified B group vitamins: Secondary | ICD-10-CM

## 2018-09-05 DIAGNOSIS — Z79899 Other long term (current) drug therapy: Secondary | ICD-10-CM | POA: Insufficient documentation

## 2018-09-05 DIAGNOSIS — E1165 Type 2 diabetes mellitus with hyperglycemia: Secondary | ICD-10-CM | POA: Diagnosis not present

## 2018-09-05 DIAGNOSIS — C8580 Other specified types of non-Hodgkin lymphoma, unspecified site: Secondary | ICD-10-CM | POA: Diagnosis not present

## 2018-09-05 DIAGNOSIS — C61 Malignant neoplasm of prostate: Secondary | ICD-10-CM

## 2018-09-05 DIAGNOSIS — C7951 Secondary malignant neoplasm of bone: Secondary | ICD-10-CM

## 2018-09-05 DIAGNOSIS — I4821 Permanent atrial fibrillation: Secondary | ICD-10-CM | POA: Diagnosis not present

## 2018-09-05 DIAGNOSIS — D472 Monoclonal gammopathy: Secondary | ICD-10-CM

## 2018-09-05 LAB — COMPREHENSIVE METABOLIC PANEL
ALT: 12 U/L (ref 0–44)
AST: 16 U/L (ref 15–41)
Albumin: 3.8 g/dL (ref 3.5–5.0)
Alkaline Phosphatase: 51 U/L (ref 38–126)
Anion gap: 11 (ref 5–15)
BUN: 22 mg/dL (ref 8–23)
CO2: 24 mmol/L (ref 22–32)
Calcium: 10.3 mg/dL (ref 8.9–10.3)
Chloride: 104 mmol/L (ref 98–111)
Creatinine, Ser: 0.86 mg/dL (ref 0.61–1.24)
GFR calc Af Amer: >60 mL/min (ref >60)
GFR calc non Af Amer: >60 mL/min (ref >60)
Glucose, Bld: 146 mg/dL — ABNORMAL HIGH (ref 70–99)
Potassium: 4.3 mmol/L (ref 3.5–5.1)
Sodium: 139 mmol/L (ref 135–145)
Total Bilirubin: 0.7 mg/dL (ref 0.3–1.2)
Total Protein: 6.6 g/dL (ref 6.5–8.1)

## 2018-09-05 MED ORDER — DENOSUMAB 120 MG/1.7ML ~~LOC~~ SOLN
120.0000 mg | Freq: Once | SUBCUTANEOUS | Status: AC
  Administered 2018-09-05: 120 mg via SUBCUTANEOUS
  Filled 2018-09-05: qty 1.7

## 2018-09-05 MED ORDER — LEUPROLIDE ACETATE (6 MONTH) 45 MG IM KIT
45.0000 mg | PACK | Freq: Once | INTRAMUSCULAR | Status: AC
  Administered 2018-09-05: 45 mg via INTRAMUSCULAR
  Filled 2018-09-05: qty 45

## 2018-09-05 NOTE — Progress Notes (Signed)
To treatment area for Xgeva shot.  Taking calcium as directed.  Denied tooth, jaw, or leg pain.  No s/s of distress noted.   Patient tolerated injection with no complaints voiced.  Site clean and dry with no bruising or swelling noted at site.  Band aid applied.  Vss with discharge and left ambulatory with no s/s of distress noted.

## 2018-09-06 ENCOUNTER — Telehealth: Payer: Self-pay

## 2018-09-06 DIAGNOSIS — E1151 Type 2 diabetes mellitus with diabetic peripheral angiopathy without gangrene: Secondary | ICD-10-CM | POA: Diagnosis not present

## 2018-09-06 DIAGNOSIS — I4891 Unspecified atrial fibrillation: Secondary | ICD-10-CM | POA: Diagnosis not present

## 2018-09-06 DIAGNOSIS — N390 Urinary tract infection, site not specified: Secondary | ICD-10-CM | POA: Diagnosis not present

## 2018-09-06 DIAGNOSIS — C61 Malignant neoplasm of prostate: Secondary | ICD-10-CM | POA: Diagnosis not present

## 2018-09-06 DIAGNOSIS — C7951 Secondary malignant neoplasm of bone: Secondary | ICD-10-CM | POA: Diagnosis not present

## 2018-09-06 DIAGNOSIS — F0390 Unspecified dementia without behavioral disturbance: Secondary | ICD-10-CM | POA: Diagnosis not present

## 2018-09-06 NOTE — Telephone Encounter (Signed)
Daughter notified that patient's Eliquis has arrived from the Patient assistance program.They will pick up 09/07/18

## 2018-09-13 DIAGNOSIS — C61 Malignant neoplasm of prostate: Secondary | ICD-10-CM | POA: Diagnosis not present

## 2018-09-13 DIAGNOSIS — N390 Urinary tract infection, site not specified: Secondary | ICD-10-CM | POA: Diagnosis not present

## 2018-09-13 DIAGNOSIS — F0390 Unspecified dementia without behavioral disturbance: Secondary | ICD-10-CM | POA: Diagnosis not present

## 2018-09-13 DIAGNOSIS — C7951 Secondary malignant neoplasm of bone: Secondary | ICD-10-CM | POA: Diagnosis not present

## 2018-09-13 DIAGNOSIS — E1151 Type 2 diabetes mellitus with diabetic peripheral angiopathy without gangrene: Secondary | ICD-10-CM | POA: Diagnosis not present

## 2018-09-13 DIAGNOSIS — I4891 Unspecified atrial fibrillation: Secondary | ICD-10-CM | POA: Diagnosis not present

## 2018-09-15 ENCOUNTER — Ambulatory Visit (INDEPENDENT_AMBULATORY_CARE_PROVIDER_SITE_OTHER): Payer: Medicare Other | Admitting: *Deleted

## 2018-09-15 DIAGNOSIS — Z5181 Encounter for therapeutic drug level monitoring: Secondary | ICD-10-CM | POA: Diagnosis not present

## 2018-09-15 DIAGNOSIS — I480 Paroxysmal atrial fibrillation: Secondary | ICD-10-CM | POA: Diagnosis not present

## 2018-09-15 NOTE — Progress Notes (Signed)
Patient ID: [name], male   DOB: 1923-09-03, 85 y.o.   MRN: [id number]  1 month Eliquis follow up:  Delayed 1 month due to COVID 19.  This is a telephone visit due to pt being [place] for exposure to [place] with pt's daughter [name].  Pt was started on Eliquis 5mg  twice daily for atrial fib on 06/19/2018 by Dr Harl Bowie.    Per daughter pt has not had any adverse effects since starting Eliquis.  Denies bleeding, increased bruising or GI upset.  Reviewed patients medication list.  Pt is not currently on any combined P-gp and strong CYP3A4 inhibitors/inducers (ketoconazole, traconazole, ritonavir, carbamazepine, phenytoin, rifampin, St. John's wort).  Reviewed labs from 09/05/18.  SCr 0.86, Weight 70.3 kg, CrCl 54.50.  Dose is appropriate based on age, weight, and SCr.  Hgb and HCT: 08/05/18  12.2/35.6   Labs scheduled to be drawn again 10/05/18 at [facility].  A full discussion of the nature of anticoagulants has been carried out.  A benefit/risk analysis has been presented to the patient, so that they understand the justification for choosing anticoagulation with Eliquis at this time.  The need for compliance is stressed.  Pt is aware to take the medication twice daily.  Side effects of potential bleeding are discussed, including unusual colored urine or stools, coughing up blood or coffee ground emesis, nose bleeds or serious fall or head trauma.  Discussed signs and symptoms of stroke. The patient should avoid any OTC items containing aspirin or ibuprofen.  Avoid alcohol consumption.   Call if any signs of abnormal bleeding.  Discussed financial obligations and resolved any difficulty in obtaining medication.  Next lab test in 6 months.   Pt has Eliquis pills from company in the office that daughter will pick up 09/15/18/

## 2018-09-26 DIAGNOSIS — N3020 Other chronic cystitis without hematuria: Secondary | ICD-10-CM | POA: Diagnosis not present

## 2018-09-27 ENCOUNTER — Other Ambulatory Visit (HOSPITAL_COMMUNITY): Payer: Self-pay | Admitting: Hematology

## 2018-09-27 DIAGNOSIS — C61 Malignant neoplasm of prostate: Secondary | ICD-10-CM

## 2018-09-28 ENCOUNTER — Other Ambulatory Visit (HOSPITAL_COMMUNITY): Payer: Self-pay | Admitting: *Deleted

## 2018-09-28 DIAGNOSIS — C61 Malignant neoplasm of prostate: Secondary | ICD-10-CM

## 2018-09-28 MED ORDER — ABIRATERONE ACETATE 250 MG PO TABS: ORAL_TABLET | ORAL | 11 refills | Status: DC

## 2018-09-29 DIAGNOSIS — B078 Other viral warts: Secondary | ICD-10-CM | POA: Diagnosis not present

## 2018-09-29 DIAGNOSIS — L820 Inflamed seborrheic keratosis: Secondary | ICD-10-CM | POA: Diagnosis not present

## 2018-10-03 ENCOUNTER — Other Ambulatory Visit: Payer: Self-pay

## 2018-10-04 ENCOUNTER — Encounter (HOSPITAL_COMMUNITY): Payer: Self-pay | Admitting: Hematology

## 2018-10-04 ENCOUNTER — Inpatient Hospital Stay (HOSPITAL_COMMUNITY): Payer: Medicare Other | Attending: Hematology | Admitting: Hematology

## 2018-10-04 ENCOUNTER — Inpatient Hospital Stay (HOSPITAL_COMMUNITY): Payer: Medicare Other

## 2018-10-04 VITALS — BP 125/58 | HR 90 | Temp 98.5°F | Resp 18 | Wt 150.0 lb

## 2018-10-04 DIAGNOSIS — Z7984 Long term (current) use of oral hypoglycemic drugs: Secondary | ICD-10-CM

## 2018-10-04 DIAGNOSIS — Z808 Family history of malignant neoplasm of other organs or systems: Secondary | ICD-10-CM | POA: Insufficient documentation

## 2018-10-04 DIAGNOSIS — D472 Monoclonal gammopathy: Secondary | ICD-10-CM | POA: Diagnosis not present

## 2018-10-04 DIAGNOSIS — Z79899 Other long term (current) drug therapy: Secondary | ICD-10-CM | POA: Insufficient documentation

## 2018-10-04 DIAGNOSIS — C61 Malignant neoplasm of prostate: Secondary | ICD-10-CM

## 2018-10-04 DIAGNOSIS — Z803 Family history of malignant neoplasm of breast: Secondary | ICD-10-CM | POA: Diagnosis not present

## 2018-10-04 DIAGNOSIS — I10 Essential (primary) hypertension: Secondary | ICD-10-CM | POA: Diagnosis not present

## 2018-10-04 DIAGNOSIS — Z800 Family history of malignant neoplasm of digestive organs: Secondary | ICD-10-CM | POA: Insufficient documentation

## 2018-10-04 DIAGNOSIS — C161 Malignant neoplasm of fundus of stomach: Secondary | ICD-10-CM | POA: Insufficient documentation

## 2018-10-04 DIAGNOSIS — Z8509 Personal history of malignant neoplasm of other digestive organs: Secondary | ICD-10-CM | POA: Insufficient documentation

## 2018-10-04 DIAGNOSIS — Z8572 Personal history of non-Hodgkin lymphomas: Secondary | ICD-10-CM | POA: Insufficient documentation

## 2018-10-04 DIAGNOSIS — Z85828 Personal history of other malignant neoplasm of skin: Secondary | ICD-10-CM | POA: Diagnosis not present

## 2018-10-04 DIAGNOSIS — C7951 Secondary malignant neoplasm of bone: Secondary | ICD-10-CM | POA: Diagnosis not present

## 2018-10-04 DIAGNOSIS — E119 Type 2 diabetes mellitus without complications: Secondary | ICD-10-CM | POA: Diagnosis not present

## 2018-10-04 DIAGNOSIS — I4891 Unspecified atrial fibrillation: Secondary | ICD-10-CM | POA: Insufficient documentation

## 2018-10-04 LAB — COMPREHENSIVE METABOLIC PANEL
ALT: 14 U/L (ref 0–44)
AST: 15 U/L (ref 15–41)
Albumin: 3.8 g/dL (ref 3.5–5.0)
Alkaline Phosphatase: 70 U/L (ref 38–126)
Anion gap: 9 (ref 5–15)
BUN: 23 mg/dL (ref 8–23)
CO2: 25 mmol/L (ref 22–32)
Calcium: 10.1 mg/dL (ref 8.9–10.3)
Chloride: 106 mmol/L (ref 98–111)
Creatinine, Ser: 1.01 mg/dL (ref 0.61–1.24)
GFR calc Af Amer: >60 mL/min (ref >60)
GFR calc non Af Amer: >60 mL/min (ref >60)
Glucose, Bld: 108 mg/dL — ABNORMAL HIGH (ref 70–99)
Potassium: 4.4 mmol/L (ref 3.5–5.1)
Sodium: 140 mmol/L (ref 135–145)
Total Bilirubin: 0.6 mg/dL (ref 0.3–1.2)
Total Protein: 6.8 g/dL (ref 6.5–8.1)

## 2018-10-04 LAB — CBC WITH DIFFERENTIAL/PLATELET
Abs Immature Granulocytes: 0.17 10*3/uL — ABNORMAL HIGH (ref 0.00–0.07)
Basophils Absolute: 0 10*3/uL (ref 0.0–0.1)
Basophils Relative: 0 %
Eosinophils Absolute: 0 10*3/uL (ref 0.0–0.5)
Eosinophils Relative: 1 %
HCT: 35.2 % — ABNORMAL LOW (ref 39.0–52.0)
Hemoglobin: 11.7 g/dL — ABNORMAL LOW (ref 13.0–17.0)
Immature Granulocytes: 2 %
Lymphocytes Relative: 24 %
Lymphs Abs: 2.1 10*3/uL (ref 0.7–4.0)
MCH: 31.4 pg (ref 26.0–34.0)
MCHC: 33.2 g/dL (ref 30.0–36.0)
MCV: 94.4 fL (ref 80.0–100.0)
Monocytes Absolute: 1.1 10*3/uL — ABNORMAL HIGH (ref 0.1–1.0)
Monocytes Relative: 12 %
Neutro Abs: 5.3 10*3/uL (ref 1.7–7.7)
Neutrophils Relative %: 61 %
Platelets: 227 10*3/uL (ref 150–400)
RBC: 3.73 MIL/uL — ABNORMAL LOW (ref 4.22–5.81)
RDW: 12.8 % (ref 11.5–15.5)
WBC: 8.7 10*3/uL (ref 4.0–10.5)
nRBC: 0 % (ref 0.0–0.2)

## 2018-10-04 LAB — LACTATE DEHYDROGENASE: LDH: 108 U/L (ref 98–192)

## 2018-10-04 LAB — PSA: Prostatic Specific Antigen: <0.01 ng/mL (ref 0.00–4.00)

## 2018-10-04 MED ORDER — DENOSUMAB 120 MG/1.7ML ~~LOC~~ SOLN
SUBCUTANEOUS | Status: AC
  Filled 2018-10-04: qty 1.7

## 2018-10-04 MED ORDER — DENOSUMAB 120 MG/1.7ML ~~LOC~~ SOLN
120.0000 mg | Freq: Once | SUBCUTANEOUS | Status: AC
  Administered 2018-10-04: 120 mg via SUBCUTANEOUS

## 2018-10-04 NOTE — Progress Notes (Signed)
Patient tolerated injection with no complaints voiced.  Site clean and dry with no bruising or swelling noted at site.  Band aid applied.  Vss with discharge and left ambulatory with no s/s of distress noted.  

## 2018-10-04 NOTE — Patient Instructions (Addendum)
[facility] Discharge Instructions  You were seen today by Dr. Katragadda. He went over your recent lab results. He will see you back in 2 months for labs and follow up.   Thank you for choosing [facility] to provide your oncology and hematology care.  To afford each patient quality time with our provider, please arrive at least 15 minutes before your scheduled appointment time.   If you have a lab appointment with the Cancer Center please come in thru the  Main Entrance and check in at the main information desk  You need to re-schedule your appointment should you arrive 10 or more minutes late.  We strive to give you quality time with our providers, and arriving late affects you and other patients whose appointments are after yours.  Also, if you no show three or more times for appointments you may be dismissed from the clinic at the providers discretion.     Again, thank you for choosing [facility].  Our hope is that these requests will decrease the amount of time that you wait before being seen by our physicians.       _____________________________________________________________  Should you have questions after your visit to  Cancer Center, please contact our office at [phone] between the hours of 8:00 a.m. and 4:30 p.m.  Voicemails left after 4:00 p.m. will not be returned until the following business day.  For prescription refill requests, have your pharmacy contact our office and allow 72 hours.    Cancer Center Support Programs:   > Cancer Support Group  2nd Tuesday of the month 1pm-2pm, Journey Room    

## 2018-10-04 NOTE — Assessment & Plan Note (Signed)
1.  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 -Started on Abiraterone and prednisone in October 2016, tolerating very well.  He is on every 62-month Lupron injections.  Last Lupron injection was on 09/06/2017.  Last PSA was down to 0.01 on the same day.  Last bone scan dated 08/02/2017  shows interval improvement in T12 lesion.  No new lesions were seen. - Abiraterone was dose reduced to 500 mg once a day and prednisone 2.5 mg twice daily on 02/10/2018. -Last Lupron injection 45 mg on 09/05/2018. - He is tolerating Abiraterone very well.  He has a recent urinary tract infection and finished antibiotics.  He will be placed on prophylactic trimethoprim daily until November by Dr. Diona Fanti. - Last PSA in April was undetectable. -he will continue Abiraterone at the same dose.  He will continue prednisone 2.5 mg twice daily.  We will see him back again in 2 months with repeat PSA.  2.  Bone strengthening: -His calcium is 10.1 today.  He is tolerating denosumab very well. -He will continue denosumab today.  He will continue calcium daily.  3.  IgM MGUS: -Last M spike was 0.4 in April 2020.  We will continue to monitor it at 68-month intervals.  4.  DLBCL: -Diagnosed in 2005.  He does not have any B symptoms or lymphadenopathy.  5.  Gist: -Diagnosed in October 2005.  Status post resection of the small bowel.  No clinical signs or symptoms of recurrence.

## 2018-10-04 NOTE — Progress Notes (Signed)
[place], [place] [address]   CLINIC:  Medical Oncology/Hematology  PCP:  Rosita Fire, MD [place] [address] [phone]   REASON FOR VISIT: Follow-up for metastatic prostate cancer to the bones  CURRENT THERAPY:Abiraterone(Zytiga) and prednisone  BRIEF ONCOLOGIC HISTORY:  Oncology History  GIST (gastrointestinal stromal tumor), malignant ([place])  08/18/2011 Initial Diagnosis   GIST (gastrointestinal stromal tumor), malignant ([place])   02/08/2017 Genetic Testing   ATM Gain (Exons 62-63) VUS identified on the multi-gene panel.  The Multi-Gene Panel offered by [facility] includes sequencing and/or deletion duplication testing of the following 80 genes: ALK, APC, ATM, AXIN2,BAP1,  BARD1, BLM, BMPR1A, BRCA1, BRCA2, BRIP1, CASR, CDC73, CDH1, CDK4, CDKN1B, CDKN1C, CDKN2A (p14ARF), CDKN2A (p16INK4a), CEBPA, CHEK2, CTNNA1, DICER1, DIS3L2, EGFR (c.2369C>T, p.Thr790Met variant only), EPCAM (Deletion/duplication testing only), FH, FLCN, GATA2, GPC3, GREM1 (Promoter region deletion/duplication testing only), HOXB13 (c.251G>A, p.Gly84Glu), HRAS, KIT, MAX, MEN1, MET, MITF (c.952G>A, p.Glu318Lys variant only), MLH1, MSH2, MSH3, MSH6, MUTYH, NBN, NF1, NF2, NTHL1, PALB2, PDGFRA, PHOX2B, PMS2, POLD1, POLE, POT1, PRKAR1A, PTCH1, PTEN, RAD50, RAD51C, RAD51D, RB1, RECQL4, RET, RUNX1, SDHAF2, SDHA (sequence changes only), SDHB, SDHC, SDHD, SMAD4, SMARCA4, SMARCB1, SMARCE1, STK11, SUFU, TERT, TERT, TMEM127, TP53, TSC1, TSC2, VHL, WRN and WT1.  The report date is February 08, 2017.    Prostate cancer ([place])  09/08/2013 Initial Diagnosis   Prostate cancer ([place])   02/08/2017 Genetic Testing   ATM Gain (Exons 62-63) VUS identified on the multi-gene panel.  The Multi-Gene Panel offered by [facility] includes sequencing and/or deletion duplication testing of the following 80 genes: ALK, APC, ATM, AXIN2,BAP1,  BARD1, BLM, BMPR1A, BRCA1, BRCA2, BRIP1, CASR,  CDC73, CDH1, CDK4, CDKN1B, CDKN1C, CDKN2A (p14ARF), CDKN2A (p16INK4a), CEBPA, CHEK2, CTNNA1, DICER1, DIS3L2, EGFR (c.2369C>T, p.Thr790Met variant only), EPCAM (Deletion/duplication testing only), FH, FLCN, GATA2, GPC3, GREM1 (Promoter region deletion/duplication testing only), HOXB13 (c.251G>A, p.Gly84Glu), HRAS, KIT, MAX, MEN1, MET, MITF (c.952G>A, p.Glu318Lys variant only), MLH1, MSH2, MSH3, MSH6, MUTYH, NBN, NF1, NF2, NTHL1, PALB2, PDGFRA, PHOX2B, PMS2, POLD1, POLE, POT1, PRKAR1A, PTCH1, PTEN, RAD50, RAD51C, RAD51D, RB1, RECQL4, RET, RUNX1, SDHAF2, SDHA (sequence changes only), SDHB, SDHC, SDHD, SMAD4, SMARCA4, SMARCB1, SMARCE1, STK11, SUFU, TERT, TERT, TMEM127, TP53, TSC1, TSC2, VHL, WRN and WT1.  The report date is February 08, 2017.       CANCER STAGING: Cancer Staging NHL (non-Hodgkin's lymphoma) (HCC) Staging form: Lymphoid Neoplasms, AJCC 6th Edition - Clinical: Stage III - Signed by Baird Cancer, PA on 08/18/2011    INTERVAL HISTORY:  [name] 85 y.o. male returns for follow-up of metastatic prostate cancer and MGUS.  He denies any new onset bone pains.  He is tolerating abiraterone and prednisone very well.  Recently had a urinary tract infection and finished antibiotics.  He will start trimethoprim daily for prophylaxis until November started by Dr. Diona Fanti.  Denies any recent hospitalizations or ER visits.  Denies any jaw pains.  Is taking calcium daily.  Appetite and energy levels are 50%.  REVIEW OF SYSTEMS:  Review of Systems  Constitutional: Negative for fatigue.  Genitourinary: Positive for frequency.   All other systems reviewed and are negative.    PAST MEDICAL/SURGICAL HISTORY:  Past Medical History:  Diagnosis Date  . Acute bronchitis   . Arthritis   . Atrial fibrillation ([place])    Dr. Harl Bowie- [facility] follows saw 11'14  . Bladder stones    tx. with oral meds and antibiotics, now surgery planned  . Cataracts, both eyes  surgery planned May 2015  .  Diabetes mellitus without complication ([place] [facility])    Type II  . Dyspnea    with activity  . Dysrhythmia    Afib  . Family history of breast cancer   . Family history of colon cancer   . GERD (gastroesophageal reflux disease)   . 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 Margot Chimes, thus far without recurrence.   . Hypertension   . NHL (non-Hodgkin's lymphoma) ([facility]) 2005   Diffuse large B-cell lymphoma, clinically stage IIIA, CD20 positive, status post cervical lymph node biopsy 08/03/2003 on the left. PET scan was also positive in the spleen and small bowel region, but bone marrow aspiration and biopsy were negative. So he essentially had stage IIIAs. He received R-CHOP x6 cycles with CR established by PET scan criteria on 11/23/2003 with no evidence for relapse th  . PAD (peripheral artery disease) ([place]) 08/27/2017  . Skin cancer    Basal cell- face,head, neck,  arms, legs, Back   Past Surgical History:  Procedure Laterality Date  . AMPUTATION Left 06/29/2017   Procedure: AMPUTATION LEFT GREAT TOE;  Surgeon: [name], MD;  Location: [facility];  Service: Vascular;  Laterality: Left;  . AMPUTATION Left 08/27/2017   Procedure: AMPUTATION Left SECOND TOE;  Surgeon: [name], MD;  Location: [facility];  Service: Vascular;  Laterality: Left;  . CATARACT EXTRACTION Bilateral 2015  . CHOLECYSTECTOMY    . COLON RESECTION     small bowel  . CYSTOSCOPY WITH LITHOLAPAXY N/A 08/10/2013   Procedure: CYSTOSCOPY WITH LITHOLAPAXY WITH [name];  Surgeon: [name], MD;  [facility];  Service: Urology;  Laterality: N/A;  . ESOPHAGEAL DILATION N/A 02/27/2016   Procedure: ESOPHAGEAL DILATION;  Surgeon: [name], MD;  [facility];  Service:  Endoscopy;  Laterality: N/A;  . ESOPHAGEAL DILATION N/A 07/07/2018   Procedure: ESOPHAGEAL DILATION;  Surgeon: [name], MD;  [facility];  Service: Endoscopy;  Laterality: N/A;  . ESOPHAGOGASTRODUODENOSCOPY N/A 02/27/2016   Procedure: ESOPHAGOGASTRODUODENOSCOPY (EGD);  Surgeon: [name], MD;  [facility];  Service: Endoscopy;  Laterality: N/A;  12:00  . ESOPHAGOGASTRODUODENOSCOPY N/A 07/07/2018   Procedure: ESOPHAGOGASTRODUODENOSCOPY (EGD);  Surgeon: [name], MD;  [facility];  Service: Endoscopy;  Laterality: N/A;  . INCISION AND DRAINAGE PERIRECTAL ABSCESS    . LOWER EXTREMITY ANGIOGRAPHY N/A 06/25/2017   Procedure: LOWER EXTREMITY ANGIOGRAPHY;  Surgeon: [name], MD;  Location: [facility];  Service: Cardiovascular;  Laterality: N/A;  . LYMPH NODE DISSECTION Left    '05-neck  . PORT-A-CATH REMOVAL    . PORTACATH PLACEMENT     insertion and removal -last chemotherapy 10 yrs ago  . TRANSURETHRAL RESECTION OF PROSTATE N/A 08/10/2013   Procedure: TRANSURETHRAL RESECTION OF THE PROSTATE WITH GYRUS INSTRUMENTS;  Surgeon: [name], MD;  [facility];  Service: Urology;  Laterality: N/A;     SOCIAL HISTORY:  Social History   Socioeconomic History  . Marital status: Married    Spouse name: Not on file  . Number of children: Not on file  . Years of education: Not on file  . Highest education level: Not on file  Occupational History  . Occupation: retired  Employer: RETIRED    Comment: salesman  Social Needs  . Financial resource strain: Not on file  . Food insecurity    Worry: Not on file    Inability: Not on file  . Transportation needs    Medical: Not on file    Non-medical: Not on file  Tobacco Use  . Smoking status: Never Smoker  . Smokeless tobacco: Never Used  Substance and Sexual Activity  . Alcohol use: No  . Drug use: No  . Sexual activity: Not Currently  Lifestyle  . Physical  activity    Days per week: Not on file    Minutes per session: Not on file  . Stress: Not on file  Relationships  . Social Herbalist on phone: Not on file    Gets together: Not on file    Attends religious service: Not on file    Active member of club or organization: Not on file    Attends meetings of clubs or organizations: Not on file    Relationship status: Not on file  . Intimate partner violence    Fear of current or ex partner: Not on file    Emotionally abused: Not on file    Physically abused: Not on file    Forced sexual activity: Not on file  Other Topics Concern  . Not on file  Social History Narrative  . Not on file    FAMILY HISTORY:  Family History  Problem Relation Age of Onset  . Heart attack Mother        died in her 43s  . Other Father 83       pneumonia - died  . Breast cancer Sister        dx in her 85s-60s  . Colon cancer Brother        dx in his 31s  . Cancer Sister        TAH/BSO due to "male cancer"  . Breast cancer Daughter 97  . Breast cancer Daughter 2       DCIS - ATM VUS on [facility] 83 gene panel in 2018  . Breast cancer Daughter 8       DCIS - Negative on [facility] MyRisk 25 gene apnel in 2015  . Thyroid cancer Daughter 56       Medullary and Papillary  . Thyroid nodules Grandchild     CURRENT MEDICATIONS:  Outpatient Encounter Medications as of 10/04/2018  Medication Sig  . abiraterone acetate (ZYTIGA) 250 MG tablet TAKE 2 TABLETS BY MOUTH ONCE DAILY AS DIRECTED.  TAKE 1 HOUR BEFORE OR 2 HOURS AFTER A MEAL  . acetaminophen (TYLENOL) 500 MG tablet Take 500-1,000 mg by mouth every 8 (eight) hours as needed for mild pain, fever or headache.   . albuterol (PROVENTIL HFA;VENTOLIN HFA) 108 (90 Base) MCG/ACT inhaler Inhale 2 puffs into the lungs every 6 (six) hours as needed for wheezing or shortness of breath.  Marland Kitchen apixaban (ELIQUIS) 5 MG TABS tablet Take 5 mg by mouth 2 (two) times daily.  . Calcium Carb-Cholecalciferol (CALCIUM  600+D) 600-800 MG-UNIT TABS Take 1 tablet by mouth daily.   . Calcium Polycarbophil (FIBER-CAPS PO) Take 1 capsule by mouth 2 (two) times daily.  . Cholecalciferol (VITAMIN D3) 1000 units CAPS Take 1,000 Units by mouth every morning.   . Cranberry-Vitamin C-Vitamin E (CRANBERRY PLUS VITAMIN C) 4200-20-3 MG-MG-UNIT CAPS Take 1 capsule by mouth every morning.  . Cyanocobalamin (VITAMIN B-12 IJ) Inject 1,000 mcg  as directed every 30 (thirty) days.   Marland Kitchen denosumab (XGEVA) 120 MG/1.7ML SOLN injection Inject 120 mg into the skin every 30 (thirty) days.   Marland Kitchen diltiazem (CARDIZEM) 60 MG tablet Take 1 tablet (60 mg total) by mouth 2 (two) times daily.  . diphenhydrAMINE-APAP, sleep, (TYLENOL PM EXTRA STRENGTH PO) Take 1 tablet by mouth at bedtime.   . donepezil (ARICEPT) 10 MG tablet Take 10 mg by mouth daily.  . feeding supplement, ENSURE ENLIVE, (ENSURE ENLIVE) LIQD Take 237 mLs by mouth daily. CHOCOLATE   . fluticasone (FLONASE) 50 MCG/ACT nasal spray Place 2 sprays into both nostrils daily.  . furosemide (LASIX) 20 MG tablet Take 1 tablet (20 mg total) by mouth daily.  Marland Kitchen glipiZIDE (GLUCOTROL XL) 5 MG 24 hr tablet Take 5 mg by mouth daily with breakfast.   . guaiFENesin (MUCINEX) 600 MG 12 hr tablet Take 600 mg by mouth 2 (two) times daily as needed for to loosen phlegm.  Marland Kitchen Leuprolide Acetate, 6 Month, (LUPRON DEPOT) 45 MG injection Inject 45 mg into the muscle every 6 (six) months.  Marland Kitchen lisinopril (PRINIVIL,ZESTRIL) 20 MG tablet Take 1 tablet (20 mg total) by mouth daily.  Marland Kitchen loperamide (IMODIUM) 2 MG capsule Take 1 capsule (2 mg total) by mouth as needed for diarrhea or loose stools.  . metFORMIN (GLUCOPHAGE) 850 MG tablet Take 850 mg by mouth 2 (two) times daily with a meal.   . mirtazapine (REMERON) 7.5 MG tablet Take 7.5 mg by mouth at bedtime.  . Multiple Vitamin (MULTIVITAMIN WITH MINERALS) TABS tablet Take 1 tablet by mouth every morning.  . nitrofurantoin (MACRODANTIN) 100 MG capsule TAKE 1 CAPSULE  BY MOUTH TWICE DAILY FOR 5 DAYS  . nystatin (MYCOSTATIN) powder Apply 1 g topically 4 (four) times daily as needed (fungus).   Marland Kitchen omeprazole (PRILOSEC) 40 MG capsule Take 40 mg by mouth every other day.   . potassium chloride (K-DUR,KLOR-CON) 10 MEQ tablet Take 2 tablets (20 mEq total) by mouth daily. (Patient taking differently: Take 10 mEq by mouth 2 (two) times daily. )  . predniSONE (DELTASONE) 5 MG tablet Take 1/2 (one-half) tablet by mouth twice daily  . trimethoprim (TRIMPEX) 100 MG tablet Take 100 mg by mouth at bedtime.   No facility-administered encounter medications on file as of 10/04/2018.     ALLERGIES:  Allergies  Allergen Reactions  . Tape Other (See Comments)    SKIN IS VERY THIN AND WILL TEAR AND BRUISE VERY EASILY!!!!  . Augmentin [Amoxicillin-Pot Clavulanate] Rash and Other (See Comments)    Redness of skin     PHYSICAL EXAM:  ECOG Performance status: 1  Vitals:   10/04/18 1048  BP: (!) 125/58  Pulse: 90  Resp: 18  Temp: 98.5 F (36.9 C)  SpO2: 100%   Filed Weights   10/04/18 1048  Weight: 150 lb (68 kg)    Physical Exam Constitutional:      Appearance: Normal appearance. He is normal weight.  Cardiovascular:     Rate and Rhythm: Normal rate and regular rhythm.     Heart sounds: Normal heart sounds.  Pulmonary:     Effort: Pulmonary effort is normal.     Breath sounds: Normal breath sounds.  Abdominal:     General: There is no distension.     Palpations: Abdomen is soft. There is no mass.  Musculoskeletal:        General: No swelling.  Skin:    General: Skin is warm.  Neurological:  Mental Status: He is alert and oriented to person, place, and time. Mental status is at baseline.  Psychiatric:        Mood and Affect: Mood normal.        Behavior: Behavior normal.      LABORATORY DATA:  I have reviewed the labs as listed.  CBC    Component Value Date/Time   WBC 8.7 10/04/2018 0958   RBC 3.73 (L) 10/04/2018 0958   HGB 11.7 (L)  10/04/2018 0958   HCT 35.2 (L) 10/04/2018 0958   PLT 227 10/04/2018 0958   MCV 94.4 10/04/2018 0958   MCH 31.4 10/04/2018 0958   MCHC 33.2 10/04/2018 0958   RDW 12.8 10/04/2018 0958   LYMPHSABS 2.1 10/04/2018 0958   MONOABS 1.1 (H) 10/04/2018 0958   EOSABS 0.0 10/04/2018 0958   BASOSABS 0.0 10/04/2018 0958   CMP Latest Ref Rng & Units 10/04/2018 09/05/2018 08/05/2018  Glucose 70 - 99 mg/dL 108(H) 146(H) 185(H)  BUN 8 - 23 mg/dL _0 Creatinine 0.61 - 1.24 mg/dL 1.01 0.86 0.71  Sodium 135 - 145 mmol/L 140 139 138  Potassium 3.5 - 5.1 mmol/L 4.4 4.3 4.1  Chloride 98 - 111 mmol/L 106 104 105  CO2 22 - 32 mmol/L _1 Calcium 8.9 - 10.3 mg/dL 10.1 10.3 10.6(H)  Total Protein 6.5 - 8.1 g/dL 6.8 6.6 6.8  Total Bilirubin 0.3 - 1.2 mg/dL 0.6 0.7 1.1  Alkaline Phos 38 - 126 U/L 70 51 66  AST 15 - 41 U/L _2 ALT 0 - 44 U/L _3 DIAGNOSTIC IMAGING:  I have independently reviewed the scans and discussed with the patient.     ASSESSMENT & PLAN:   Prostate cancer ([place]) 1.  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 -Started on Abiraterone and prednisone in October 2016, tolerating very well.  He is on every 41-monthLupron injections.  Last Lupron injection was on 09/06/2017.  Last PSA was down to 0.01 on the same day.  Last bone scan dated 08/02/2017  shows interval improvement in T12 lesion.  No new lesions were seen. - Abiraterone was dose reduced to 500 mg once a day and prednisone 2.5 mg twice daily on 02/10/2018. -Last Lupron injection 45 mg on 09/05/2018. - He is tolerating Abiraterone very well.  He has a recent urinary tract infection and finished antibiotics.  He will be placed on prophylactic trimethoprim daily until November by Dr. DDiona Fanti - Last PSA in April was undetectable. -he will continue Abiraterone at the same dose.  He will continue prednisone 2.5 mg twice daily.  We will see him back again in 2 months with repeat  PSA.  2.  Bone strengthening: -His calcium is 10.1 today.  He is tolerating denosumab very well. -He will continue denosumab today.  He will continue calcium daily.  3.  IgM MGUS: -Last M spike was 0.4 in April 2020.  We will continue to monitor it at 658-monthntervals.  4.  DLBCL: -Diagnosed in 2005.  He does not have any B symptoms or lymphadenopathy.  5.  Gist: -Diagnosed in October 2005.  Status post resection of the small bowel.  No clinical signs or symptoms of recurrence.  Total time spent is 25 minutes with more than 50% of the time spent face-to-face discussing treatment plan and coordination of care.    Orders placed this encounter:  Orders Placed  This Encounter  Procedures  . CBC with Differential/Platelet  . Comprehensive metabolic panel  . PSA      [name] Jack, MD [place] [phone]

## 2018-11-01 ENCOUNTER — Inpatient Hospital Stay (HOSPITAL_COMMUNITY): Payer: Medicare Other

## 2018-11-01 ENCOUNTER — Encounter (HOSPITAL_COMMUNITY): Payer: Self-pay

## 2018-11-01 ENCOUNTER — Other Ambulatory Visit: Payer: Self-pay

## 2018-11-01 ENCOUNTER — Inpatient Hospital Stay (HOSPITAL_COMMUNITY): Payer: Medicare Other | Attending: Hematology

## 2018-11-01 VITALS — BP 128/62 | HR 88 | Temp 97.9°F | Resp 16 | Wt 149.5 lb

## 2018-11-01 DIAGNOSIS — C7951 Secondary malignant neoplasm of bone: Secondary | ICD-10-CM | POA: Insufficient documentation

## 2018-11-01 DIAGNOSIS — C61 Malignant neoplasm of prostate: Secondary | ICD-10-CM | POA: Diagnosis not present

## 2018-11-01 LAB — CBC WITH DIFFERENTIAL/PLATELET
Abs Immature Granulocytes: 0.16 10*3/uL — ABNORMAL HIGH (ref 0.00–0.07)
Basophils Absolute: 0 10*3/uL (ref 0.0–0.1)
Basophils Relative: 0 %
Eosinophils Absolute: 0 10*3/uL (ref 0.0–0.5)
Eosinophils Relative: 0 %
HCT: 35.3 % — ABNORMAL LOW (ref 39.0–52.0)
Hemoglobin: 12.4 g/dL — ABNORMAL LOW (ref 13.0–17.0)
Immature Granulocytes: 2 %
Lymphocytes Relative: 31 %
Lymphs Abs: 2.3 10*3/uL (ref 0.7–4.0)
MCH: 32.8 pg (ref 26.0–34.0)
MCHC: 35.1 g/dL (ref 30.0–36.0)
MCV: 93.4 fL (ref 80.0–100.0)
Monocytes Absolute: 1.1 10*3/uL — ABNORMAL HIGH (ref 0.1–1.0)
Monocytes Relative: 14 %
Neutro Abs: 4 10*3/uL (ref 1.7–7.7)
Neutrophils Relative %: 53 %
Platelets: 142 10*3/uL — ABNORMAL LOW (ref 150–400)
RBC: 3.78 MIL/uL — ABNORMAL LOW (ref 4.22–5.81)
RDW: 13.5 % (ref 11.5–15.5)
WBC: 7.7 10*3/uL (ref 4.0–10.5)
nRBC: 0 % (ref 0.0–0.2)

## 2018-11-01 LAB — COMPREHENSIVE METABOLIC PANEL
ALT: 15 U/L (ref 0–44)
AST: 16 U/L (ref 15–41)
Albumin: 3.9 g/dL (ref 3.5–5.0)
Alkaline Phosphatase: 61 U/L (ref 38–126)
Anion gap: 9 (ref 5–15)
BUN: 21 mg/dL (ref 8–23)
CO2: 25 mmol/L (ref 22–32)
Calcium: 10.2 mg/dL (ref 8.9–10.3)
Chloride: 104 mmol/L (ref 98–111)
Creatinine, Ser: 0.79 mg/dL (ref 0.61–1.24)
GFR calc Af Amer: >60 mL/min (ref >60)
GFR calc non Af Amer: >60 mL/min (ref >60)
Glucose, Bld: 126 mg/dL — ABNORMAL HIGH (ref 70–99)
Potassium: 3.8 mmol/L (ref 3.5–5.1)
Sodium: 138 mmol/L (ref 135–145)
Total Bilirubin: 0.8 mg/dL (ref 0.3–1.2)
Total Protein: 6.8 g/dL (ref 6.5–8.1)

## 2018-11-01 LAB — PSA: Prostatic Specific Antigen: <0.01 ng/mL (ref 0.00–4.00)

## 2018-11-01 MED ORDER — DENOSUMAB 120 MG/1.7ML ~~LOC~~ SOLN
120.0000 mg | Freq: Once | SUBCUTANEOUS | Status: AC
  Administered 2018-11-01: 120 mg via SUBCUTANEOUS
  Filled 2018-11-01: qty 1.7

## 2018-11-01 NOTE — Progress Notes (Signed)
[name] presents today for injection per MD orders. xgeva 120 mg administered SQ in right Upper Arm. Administration without incident. Patient tolerated well. Vitals stable and discharged home from clinic via wheelchair. Follow up as scheduled.

## 2018-11-07 DIAGNOSIS — B351 Tinea unguium: Secondary | ICD-10-CM | POA: Diagnosis not present

## 2018-11-07 DIAGNOSIS — E1142 Type 2 diabetes mellitus with diabetic polyneuropathy: Secondary | ICD-10-CM | POA: Diagnosis not present

## 2018-11-29 ENCOUNTER — Encounter: Payer: Self-pay | Admitting: Cardiology

## 2018-11-29 ENCOUNTER — Ambulatory Visit (INDEPENDENT_AMBULATORY_CARE_PROVIDER_SITE_OTHER): Payer: Medicare Other | Admitting: Cardiology

## 2018-11-29 ENCOUNTER — Other Ambulatory Visit: Payer: Self-pay

## 2018-11-29 VITALS — BP 134/76 | HR 76 | Temp 96.8°F | Ht 70.0 in | Wt 151.0 lb

## 2018-11-29 DIAGNOSIS — I10 Essential (primary) hypertension: Secondary | ICD-10-CM | POA: Diagnosis not present

## 2018-11-29 DIAGNOSIS — I4891 Unspecified atrial fibrillation: Secondary | ICD-10-CM | POA: Diagnosis not present

## 2018-11-29 DIAGNOSIS — I350 Nonrheumatic aortic (valve) stenosis: Secondary | ICD-10-CM

## 2018-11-29 NOTE — Progress Notes (Signed)
Clinical Summary Mr. Caso is a 85 y.o.male  seen today for follow up of the following medical problems.   1. Atrial fibrillation   - no recent palpitations - compliant with meds. No bleeding on eliquis   2. HTN  -he is compliant with meds  3. Prostate cancer with bone mets - followed by onc  4. Dizziness - mild orthostatic symptoms with standing previously - working to maintain hydration   - no recent dizziness.    5. Mild aortic stenosis - by echo 01/2018 - no recent symptoms    SH: his wife Akshay Spang is also a patient of mine.   Past Medical History:  Diagnosis Date  . Acute bronchitis   . Arthritis   . Atrial fibrillation ([place])    Dr. Harl Bowie- [facility] follows saw 11'14  . Bladder stones    tx. with oral meds and antibiotics, now surgery planned  . Cataracts, both eyes    surgery planned May 2015  . Diabetes mellitus without complication ([place])    Type II  . Dyspnea    with activity  . Dysrhythmia    Afib  . Family history of breast cancer   . Family history of colon cancer   . GERD (gastroesophageal reflux disease)   . 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 Margot Chimes, thus far without recurrence.   . Hypertension   . NHL (non-Hodgkin's lymphoma) ([place]) 2005   Diffuse large B-cell lymphoma, clinically stage IIIA, CD20 positive, status post cervical lymph node biopsy 08/03/2003 on the left. PET scan was also positive in the spleen and small bowel region, but bone marrow aspiration and biopsy were negative. So he essentially had stage IIIAs. He received R-CHOP x6 cycles with CR established by PET scan criteria on 11/23/2003 with no evidence for relapse th  . PAD (peripheral artery disease) ([place]) 08/27/2017   . Skin cancer    Basal cell- face,head, neck,  arms, legs, Back     Allergies  Allergen Reactions  . Tape Other (See Comments)    SKIN IS VERY THIN AND WILL TEAR AND BRUISE VERY EASILY!!!!  . Augmentin [Amoxicillin-Pot Clavulanate] Rash and Other (See Comments)    Redness of skin     Current Outpatient Medications  Medication Sig Dispense Refill  . abiraterone acetate (ZYTIGA) 250 MG tablet TAKE 2 TABLETS BY MOUTH ONCE DAILY AS DIRECTED.  TAKE 1 HOUR BEFORE OR 2 HOURS AFTER A MEAL 60 tablet 11  . acetaminophen (TYLENOL) 500 MG tablet Take 500-1,000 mg by mouth every 8 (eight) hours as needed for mild pain, fever or headache.     . albuterol (PROVENTIL HFA;VENTOLIN HFA) 108 (90 Base) MCG/ACT inhaler Inhale 2 puffs into the lungs every 6 (six) hours as needed for wheezing or shortness of breath. 1 Inhaler 2  . apixaban (ELIQUIS) 5 MG TABS tablet Take 5 mg by mouth 2 (two) times daily.    . Calcium Carb-Cholecalciferol (CALCIUM 600+D) 600-800 MG-UNIT TABS Take 1 tablet by mouth daily.     . Calcium Polycarbophil (FIBER-CAPS PO) Take 1 capsule by mouth 2 (two) times daily.    . Cholecalciferol (VITAMIN D3) 1000 units CAPS Take 1,000 Units by mouth every morning.     . Cranberry-Vitamin C-Vitamin E (CRANBERRY PLUS  VITAMIN C) 4200-20-3 MG-MG-UNIT CAPS Take 1 capsule by mouth every morning.    . Cyanocobalamin (VITAMIN B-12 IJ) Inject 1,000 mcg as directed every 30 (thirty) days.     Marland Kitchen denosumab (XGEVA) 120 MG/1.7ML SOLN injection Inject 120 mg into the skin every 30 (thirty) days.     Marland Kitchen diltiazem (CARDIZEM) 60 MG tablet Take 1 tablet (60 mg total) by mouth 2 (two) times daily. 180 tablet 3  . diphenhydrAMINE-APAP, sleep, (TYLENOL PM EXTRA STRENGTH PO) Take 1 tablet by mouth at bedtime.     . donepezil (ARICEPT) 10 MG tablet Take 10 mg by mouth daily.    . feeding supplement, ENSURE ENLIVE, (ENSURE ENLIVE) LIQD Take 237 mLs by mouth daily. CHOCOLATE     . fluticasone (FLONASE) 50 MCG/ACT  nasal spray Place 2 sprays into both nostrils daily.    . furosemide (LASIX) 20 MG tablet Take 1 tablet (20 mg total) by mouth daily. 90 tablet 3  . glipiZIDE (GLUCOTROL XL) 5 MG 24 hr tablet Take 5 mg by mouth daily with breakfast.     . guaiFENesin (MUCINEX) 600 MG 12 hr tablet Take 600 mg by mouth 2 (two) times daily as needed for to loosen phlegm.    Marland Kitchen Leuprolide Acetate, 6 Month, (LUPRON DEPOT) 45 MG injection Inject 45 mg into the muscle every 6 (six) months.    Marland Kitchen lisinopril (PRINIVIL,ZESTRIL) 20 MG tablet Take 1 tablet (20 mg total) by mouth daily. 90 tablet 3  . loperamide (IMODIUM) 2 MG capsule Take 1 capsule (2 mg total) by mouth as needed for diarrhea or loose stools. 30 capsule 0  . metFORMIN (GLUCOPHAGE) 850 MG tablet Take 850 mg by mouth 2 (two) times daily with a meal.     . mirtazapine (REMERON) 7.5 MG tablet Take 7.5 mg by mouth at bedtime.    . Multiple Vitamin (MULTIVITAMIN WITH MINERALS) TABS tablet Take 1 tablet by mouth every morning.    . nitrofurantoin (MACRODANTIN) 100 MG capsule TAKE 1 CAPSULE BY MOUTH TWICE DAILY FOR 5 DAYS    . nystatin (MYCOSTATIN) powder Apply 1 g topically 4 (four) times daily as needed (fungus).     Marland Kitchen omeprazole (PRILOSEC) 40 MG capsule Take 40 mg by mouth every other day.   3  . potassium chloride (K-DUR,KLOR-CON) 10 MEQ tablet Take 2 tablets (20 mEq total) by mouth daily. (Patient taking differently: Take 10 mEq by mouth 2 (two) times daily. ) 60 tablet 0  . predniSONE (DELTASONE) 5 MG tablet Take 1/2 (one-half) tablet by mouth twice daily 90 tablet 0  . trimethoprim (TRIMPEX) 100 MG tablet Take 100 mg by mouth at bedtime.     No current facility-administered medications for this visit.      Past Surgical History:  Procedure Laterality Date  . AMPUTATION Left 06/29/2017   Procedure: AMPUTATION LEFT GREAT TOE;  Surgeon: [name], MD;  Location: [facility];  Service: Vascular;  Laterality: Left;  . AMPUTATION Left 08/27/2017   Procedure:  AMPUTATION Left SECOND TOE;  Surgeon: [name], MD;  Location: [facility];  Service: Vascular;  Laterality: Left;  . CATARACT EXTRACTION Bilateral 2015  . CHOLECYSTECTOMY    . COLON RESECTION     small bowel  . CYSTOSCOPY WITH LITHOLAPAXY N/A 08/10/2013   Procedure: CYSTOSCOPY WITH LITHOLAPAXY WITH [name];  Surgeon: [name], MD;  Location: [facility];  Service: Urology;  Laterality: N/A;  . ESOPHAGEAL DILATION N/A 02/27/2016   Procedure: ESOPHAGEAL DILATION;  Surgeon:  [name], MD;  [facility];  Service: Endoscopy;  Laterality: N/A;  . ESOPHAGEAL DILATION N/A 07/07/2018   Procedure: ESOPHAGEAL DILATION;  Surgeon: [name], MD;  [facility];  Service: Endoscopy;  Laterality: N/A;  . ESOPHAGOGASTRODUODENOSCOPY N/A 02/27/2016   Procedure: ESOPHAGOGASTRODUODENOSCOPY (EGD);  Surgeon: [name], MD;  [facility];  Service: Endoscopy;  Laterality: N/A;  12:00  . ESOPHAGOGASTRODUODENOSCOPY N/A 07/07/2018   Procedure: ESOPHAGOGASTRODUODENOSCOPY (EGD);  Surgeon: [name], MD;  [facility];  Service: Endoscopy;  Laterality: N/A;  . INCISION AND DRAINAGE PERIRECTAL ABSCESS    . LOWER EXTREMITY ANGIOGRAPHY N/A 06/25/2017   Procedure: LOWER EXTREMITY ANGIOGRAPHY;  Surgeon: [name], MD;  Location: [facility];  Service: Cardiovascular;  Laterality: N/A;  . LYMPH NODE DISSECTION Left    '05-neck  . PORT-A-CATH REMOVAL    . PORTACATH PLACEMENT     insertion and removal -last chemotherapy 10 yrs ago  . TRANSURETHRAL RESECTION OF PROSTATE N/A 08/10/2013   Procedure: TRANSURETHRAL RESECTION OF THE PROSTATE WITH GYRUS INSTRUMENTS;  Surgeon: [name], MD;  Location: [facility];  Service: Urology;  Laterality: N/A;     Allergies  Allergen Reactions  . Tape Other (See Comments)    SKIN IS VERY THIN AND WILL TEAR AND BRUISE VERY EASILY!!!!  . Augmentin [Amoxicillin-Pot Clavulanate] Rash and Other (See  Comments)    Redness of skin      Family History  Problem Relation Age of Onset  . Heart attack Mother        died in her 11s  . Other Father 42       pneumonia - died  . Breast cancer Sister        dx in her 53s-60s  . Colon cancer Brother        dx in his 93s  . Cancer Sister        TAH/BSO due to "male cancer"  . Breast cancer Daughter 60  . Breast cancer Daughter 28       DCIS - ATM VUS on [employer] 83 gene panel in 2018  . Breast cancer Daughter 56       DCIS - Negative on [employer] My[employer] 25 gene apnel in 2015  . Thyroid cancer Daughter 26       Medullary and Papillary  . Thyroid nodules Grandchild      Social History Mr. Edmundson reports that he has never smoked. He has never used smokeless tobacco. Mr. Hillery reports no history of alcohol use.   Review of Systems CONSTITUTIONAL: No weight loss, fever, chills, weakness or fatigue.  HEENT: Eyes: No visual loss, blurred vision, double vision or yellow sclerae.No hearing loss, sneezing, congestion, runny nose or sore throat.  SKIN: No rash or itching.  CARDIOVASCULAR: per hpi RESPIRATORY: No shortness of breath, cough or sputum.  GASTROINTESTINAL: No anorexia, nausea, vomiting or diarrhea. No abdominal pain or blood.  GENITOURINARY: No burning on urination, no polyuria NEUROLOGICAL: No headache, dizziness, syncope, paralysis, ataxia, numbness or tingling in the extremities. No change in bowel or bladder control.  MUSCULOSKELETAL: No muscle, back pain, joint pain or stiffness.  LYMPHATICS: No enlarged nodes. No history of splenectomy.  PSYCHIATRIC: No history of depression or anxiety.  ENDOCRINOLOGIC: No reports of sweating, cold or heat intolerance. No polyuria or polydipsia.  Marland Kitchen   Physical Examination Today's Vitals   11/29/18 1136  BP: 134/76  Pulse: 76  Temp: (!) 96.8  F (36 C)  Weight: 151 lb (68.5 kg)  Height: _0  (1.778 m)   Body mass index is 21.67 kg/m.  Gen: resting comfortably, no acute  distress HEENT: no scleral icterus, pupils equal round and reactive, no palptable cervical adenopathy,  CV: RRR, 2/6 systolic murmur rusb, no jvd Resp: Clear to auscultation bilaterally GI: abdomen is soft, non-tender, non-distended, normal bowel sounds, no hepatosplenomegaly MSK: extremities are warm, no edema.  Skin: warm, no rash Neuro:  no focal deficits Psych: appropriate affect   Diagnostic Studies  01/2018 echo Study Conclusions  - Left ventricle: The cavity size was normal. Wall thickness was increased in a pattern of mild LVH. Systolic function was normal. The estimated ejection fraction was in the range of 55% to 60%. Wall motion was normal; there were no regional wall motion abnormalities. The study was not technically sufficient to allow evaluation of LV diastolic dysfunction due to atrial fibrillation. - Aortic valve: Moderately calcified annulus. Trileaflet; mildly thickened, mildly calcified leaflets. Morphologically, there was at least mild calcific aortic stenosis if not mild to moderate stenosis. - Mitral valve: There was mild regurgitation. - Inferior vena cava: The vessel was dilated. The respirophasic diameter changes were blunted (<50%), consistent with elevated central venous pressure. Estimated CVP 15 mmHg.   Assessment and Plan   1. Afib  - doing well without symptoms, continue current meds  2. HTN  - overall at goal, continue current meds  3. Aortic stenosis - mild, continue to monitor   F/u 6 months     Arnoldo Lenis, M.D.

## 2018-11-29 NOTE — Patient Instructions (Signed)

## 2018-11-30 ENCOUNTER — Other Ambulatory Visit: Payer: Self-pay

## 2018-11-30 ENCOUNTER — Encounter (HOSPITAL_COMMUNITY): Payer: Self-pay | Admitting: Hematology

## 2018-11-30 ENCOUNTER — Inpatient Hospital Stay (HOSPITAL_BASED_OUTPATIENT_CLINIC_OR_DEPARTMENT_OTHER): Payer: Medicare Other | Admitting: Hematology

## 2018-11-30 ENCOUNTER — Inpatient Hospital Stay (HOSPITAL_COMMUNITY): Payer: Medicare Other

## 2018-11-30 ENCOUNTER — Inpatient Hospital Stay (HOSPITAL_COMMUNITY): Payer: Medicare Other | Attending: Hematology

## 2018-11-30 VITALS — BP 121/52 | HR 68 | Temp 97.9°F | Resp 16

## 2018-11-30 DIAGNOSIS — C61 Malignant neoplasm of prostate: Secondary | ICD-10-CM

## 2018-11-30 DIAGNOSIS — D472 Monoclonal gammopathy: Secondary | ICD-10-CM

## 2018-11-30 DIAGNOSIS — Z79899 Other long term (current) drug therapy: Secondary | ICD-10-CM | POA: Diagnosis not present

## 2018-11-30 DIAGNOSIS — C7951 Secondary malignant neoplasm of bone: Secondary | ICD-10-CM | POA: Diagnosis not present

## 2018-11-30 DIAGNOSIS — I4891 Unspecified atrial fibrillation: Secondary | ICD-10-CM | POA: Diagnosis not present

## 2018-11-30 DIAGNOSIS — Z85828 Personal history of other malignant neoplasm of skin: Secondary | ICD-10-CM | POA: Insufficient documentation

## 2018-11-30 DIAGNOSIS — Z8572 Personal history of non-Hodgkin lymphomas: Secondary | ICD-10-CM | POA: Diagnosis not present

## 2018-11-30 DIAGNOSIS — Z79818 Long term (current) use of other agents affecting estrogen receptors and estrogen levels: Secondary | ICD-10-CM | POA: Insufficient documentation

## 2018-11-30 DIAGNOSIS — E119 Type 2 diabetes mellitus without complications: Secondary | ICD-10-CM | POA: Insufficient documentation

## 2018-11-30 DIAGNOSIS — Z8509 Personal history of malignant neoplasm of other digestive organs: Secondary | ICD-10-CM | POA: Diagnosis not present

## 2018-11-30 DIAGNOSIS — K219 Gastro-esophageal reflux disease without esophagitis: Secondary | ICD-10-CM | POA: Insufficient documentation

## 2018-11-30 LAB — COMPREHENSIVE METABOLIC PANEL
ALT: 14 U/L (ref 0–44)
AST: 16 U/L (ref 15–41)
Albumin: 3.8 g/dL (ref 3.5–5.0)
Alkaline Phosphatase: 48 U/L (ref 38–126)
Anion gap: 9 (ref 5–15)
BUN: 19 mg/dL (ref 8–23)
CO2: 25 mmol/L (ref 22–32)
Calcium: 10.1 mg/dL (ref 8.9–10.3)
Chloride: 104 mmol/L (ref 98–111)
Creatinine, Ser: 0.92 mg/dL (ref 0.61–1.24)
GFR calc Af Amer: >60 mL/min (ref >60)
GFR calc non Af Amer: >60 mL/min (ref >60)
Glucose, Bld: 143 mg/dL — ABNORMAL HIGH (ref 70–99)
Potassium: 4.7 mmol/L (ref 3.5–5.1)
Sodium: 138 mmol/L (ref 135–145)
Total Bilirubin: 0.6 mg/dL (ref 0.3–1.2)
Total Protein: 6.7 g/dL (ref 6.5–8.1)

## 2018-11-30 MED ORDER — DENOSUMAB 120 MG/1.7ML ~~LOC~~ SOLN
120.0000 mg | Freq: Once | SUBCUTANEOUS | Status: AC
  Administered 2018-11-30: 12:00:00 120 mg via SUBCUTANEOUS

## 2018-11-30 MED ORDER — DENOSUMAB 120 MG/1.7ML ~~LOC~~ SOLN
SUBCUTANEOUS | Status: AC
  Filled 2018-11-30: qty 1.7

## 2018-11-30 NOTE — Patient Instructions (Addendum)
[facility] Discharge Instructions  You were seen today by Dr. Katragadda. He went over your recent lab results. He will see you back in 2 months for labs and follow up.   Thank you for choosing  [facility] Center at [facility] to provide your oncology and hematology care.  To afford each patient quality time with our provider, please arrive at least 15 minutes before your scheduled appointment time.   If you have a lab appointment with the Cancer Center please come in thru the  Main Entrance and check in at the main information desk  You need to re-schedule your appointment should you arrive 10 or more minutes late.  We strive to give you quality time with our providers, and arriving late affects you and other patients whose appointments are after yours.  Also, if you no show three or more times for appointments you may be dismissed from the clinic at the providers discretion.     Again, thank you for choosing [facility].  Our hope is that these requests will decrease the amount of time that you wait before being seen by our physicians.       _____________________________________________________________  Should you have questions after your visit to  Cancer Center, please contact our office at [phone] between the hours of 8:00 a.m. and 4:30 p.m.  Voicemails left after 4:00 p.m. will not be returned until the following business day.  For prescription refill requests, have your pharmacy contact our office and allow 72 hours.    Cancer Center Support Programs:   > Cancer Support Group  2nd Tuesday of the month 1pm-2pm, Journey Room    

## 2018-11-30 NOTE — Progress Notes (Signed)
[place], [place] [address]   CLINIC:  Medical Oncology/Hematology  PCP:  Rosita Fire, MD [place]  [id number] [phone]   REASON FOR VISIT: Follow-up for metastatic prostate cancer to the bones  CURRENT THERAPY:Abiraterone(Zytiga) and prednisone  BRIEF ONCOLOGIC HISTORY:  Oncology History  GIST (gastrointestinal stromal tumor), malignant ([place])  08/18/2011 Initial Diagnosis   GIST (gastrointestinal stromal tumor), malignant ([place])   02/08/2017 Genetic Testing   ATM Gain (Exons 62-63) VUS identified on the multi-gene panel.  The Multi-Gene Panel offered by [facility] includes sequencing and/or deletion duplication testing of the following 80 genes: ALK, APC, ATM, AXIN2,BAP1,  BARD1, BLM, BMPR1A, BRCA1, BRCA2, BRIP1, CASR, CDC73, CDH1, CDK4, CDKN1B, CDKN1C, CDKN2A (p14ARF), CDKN2A (p16INK4a), CEBPA, CHEK2, CTNNA1, DICER1, DIS3L2, EGFR (c.2369C>T, p.Thr790Met variant only), EPCAM (Deletion/duplication testing only), FH, FLCN, GATA2, GPC3, GREM1 (Promoter region deletion/duplication testing only), HOXB13 (c.251G>A, p.Gly84Glu), HRAS, KIT, MAX, MEN1, MET, MITF (c.952G>A, p.Glu318Lys variant only), MLH1, MSH2, MSH3, MSH6, MUTYH, NBN, NF1, NF2, NTHL1, PALB2, PDGFRA, PHOX2B, PMS2, POLD1, POLE, POT1, PRKAR1A, PTCH1, PTEN, RAD50, RAD51C, RAD51D, RB1, RECQL4, RET, RUNX1, SDHAF2, SDHA (sequence changes only), SDHB, SDHC, SDHD, SMAD4, SMARCA4, SMARCB1, SMARCE1, STK11, SUFU, TERT, TERT, TMEM127, TP53, TSC1, TSC2, VHL, WRN and WT1.  The report date is February 08, 2017.    Prostate cancer ([place])  09/08/2013 Initial Diagnosis   Prostate cancer ([place])   02/08/2017 Genetic Testing   ATM Gain (Exons 62-63) VUS identified on the multi-gene panel.  The Multi-Gene Panel offered by [facility] includes sequencing and/or deletion duplication testing of the following 80 genes: ALK, APC, ATM, AXIN2,BAP1,  BARD1, BLM, BMPR1A, BRCA1, BRCA2, BRIP1, CASR,  CDC73, CDH1, CDK4, CDKN1B, CDKN1C, CDKN2A (p14ARF), CDKN2A (p16INK4a), CEBPA, CHEK2, CTNNA1, DICER1, DIS3L2, EGFR (c.2369C>T, p.Thr790Met variant only), EPCAM (Deletion/duplication testing only), FH, FLCN, GATA2, GPC3, GREM1 (Promoter region deletion/duplication testing only), HOXB13 (c.251G>A, p.Gly84Glu), HRAS, KIT, MAX, MEN1, MET, MITF (c.952G>A, p.Glu318Lys variant only), MLH1, MSH2, MSH3, MSH6, MUTYH, NBN, NF1, NF2, NTHL1, PALB2, PDGFRA, PHOX2B, PMS2, POLD1, POLE, POT1, PRKAR1A, PTCH1, PTEN, RAD50, RAD51C, RAD51D, RB1, RECQL4, RET, RUNX1, SDHAF2, SDHA (sequence changes only), SDHB, SDHC, SDHD, SMAD4, SMARCA4, SMARCB1, SMARCE1, STK11, SUFU, TERT, TERT, TMEM127, TP53, TSC1, TSC2, VHL, WRN and WT1.  The report date is February 08, 2017.       CANCER STAGING: Cancer Staging NHL (non-Hodgkin's lymphoma) (HCC) Staging form: Lymphoid Neoplasms, AJCC 6th Edition - Clinical: Stage III - Signed by Baird Cancer, PA on 08/18/2011    INTERVAL HISTORY:  Mr. Bertha 85 y.o. male seen for follow-up of metastatic prostate cancer and MGUS.  Appetite and energy levels are 50%.  He has occasional trouble swallowing his pills.  Is drinking about 3 cans of Ensure per day.  Denies any nausea, vomiting, diarrhea or constipation.  Denies any fevers or chills.  No ER visits or hospitalizations.  No bleeding per rectum or melena.  No new onset pains reported.  His daughter is accompanying him today.  She reports that he is having a good summer with positive spirits.  REVIEW OF SYSTEMS:  Review of Systems  HENT:   Positive for trouble swallowing.   All other systems reviewed and are negative.    PAST MEDICAL/SURGICAL HISTORY:  Past Medical History:  Diagnosis Date  . Acute bronchitis   . Arthritis   . Atrial fibrillation ([facility])    Dr. Harl Bowie- [facility] follows saw 11'14  . Bladder stones    tx. with oral meds and antibiotics,  now surgery planned  . Cataracts, both eyes    surgery planned May 2015  .  Diabetes mellitus without complication ([facility] [place])    Type II  . Dyspnea    with activity  . Dysrhythmia    Afib  . Family history of breast cancer   . Family history of colon cancer   . GERD (gastroesophageal reflux disease)   . GIST (gastrointestinal stromal tumor), malignant ([place] [facility]) 2005   Gastrointestinal stromal tumor, that is GIST, small bowel, 4.5 cm, intermediate prognostic grade found on the PET scan in the small bowel, accounting for that small bowel activity in October 2005 with resection by Dr. Margot Chimes, thus far without recurrence.   . Hypertension   . NHL (non-Hodgkin's lymphoma) ([facility]) 2005   Diffuse large B-cell lymphoma, clinically stage IIIA, CD20 positive, status post cervical lymph node biopsy 08/03/2003 on the left. PET scan was also positive in the spleen and small bowel region, but bone marrow aspiration and biopsy were negative. So he essentially had stage IIIAs. He received R-CHOP x6 cycles with CR established by PET scan criteria on 11/23/2003 with no evidence for relapse th  . PAD (peripheral artery disease) ([place]) 08/27/2017  . Skin cancer    Basal cell- face,head, neck,  arms, legs, Back   Past Surgical History:  Procedure Laterality Date  . AMPUTATION Left 06/29/2017   Procedure: AMPUTATION LEFT GREAT TOE;  Surgeon: [name], MD;  Location: [facility];  Service: Vascular;  Laterality: Left;  . AMPUTATION Left 08/27/2017   Procedure: AMPUTATION Left SECOND TOE;  Surgeon: [name], MD;  Location: [facility];  Service: Vascular;  Laterality: Left;  . CATARACT EXTRACTION Bilateral 2015  . CHOLECYSTECTOMY    . COLON RESECTION     small bowel  . CYSTOSCOPY WITH LITHOLAPAXY N/A 08/10/2013   Procedure: CYSTOSCOPY WITH LITHOLAPAXY WITH [name];  Surgeon: [name], MD;  Location: [facility];  Service: Urology;  Laterality: N/A;  . ESOPHAGEAL DILATION N/A 02/27/2016   Procedure: ESOPHAGEAL DILATION;  Surgeon: [name], MD;  [facility];  Service:  Endoscopy;  Laterality: N/A;  . ESOPHAGEAL DILATION N/A 07/07/2018   Procedure: ESOPHAGEAL DILATION;  Surgeon: [name], MD;  [facility];  Service: Endoscopy;  Laterality: N/A;  . ESOPHAGOGASTRODUODENOSCOPY N/A 02/27/2016   Procedure: ESOPHAGOGASTRODUODENOSCOPY (EGD);  Surgeon: [name], MD;  [facility];  Service: Endoscopy;  Laterality: N/A;  12:00  . ESOPHAGOGASTRODUODENOSCOPY N/A 07/07/2018   Procedure: ESOPHAGOGASTRODUODENOSCOPY (EGD);  Surgeon: [name], MD;  [facility];  Service: Endoscopy;  Laterality: N/A;  . INCISION AND DRAINAGE PERIRECTAL ABSCESS    . LOWER EXTREMITY ANGIOGRAPHY N/A 06/25/2017   Procedure: LOWER EXTREMITY ANGIOGRAPHY;  Surgeon: [name], MD;  Location: [facility];  Service: Cardiovascular;  Laterality: N/A;  . LYMPH NODE DISSECTION Left    '05-neck  . PORT-A-CATH REMOVAL    . PORTACATH PLACEMENT     insertion and removal -last chemotherapy 10 yrs ago  . TRANSURETHRAL RESECTION OF PROSTATE N/A 08/10/2013   Procedure: TRANSURETHRAL RESECTION OF THE PROSTATE WITH GYRUS INSTRUMENTS;  Surgeon: [name], MD;  Location[facility];  Service: Urology;  Laterality: N/A;     SOCIAL HISTORY:  Social History   Socioeconomic History  . Marital status: Married    Spouse name: Not on file  . Number of children: Not on file  . Years of education: Not on file  . Highest education  level: Not on file  Occupational History  . Occupation: retired    Fish farm manager: RETIRED    Comment: salesman  Social Needs  . Financial resource strain: Not on file  . Food insecurity    Worry: Not on file    Inability: Not on file  . Transportation needs    Medical: Not on file    Non-medical: Not on file  Tobacco Use  . Smoking status: Never Smoker  . Smokeless tobacco: Never Used  Substance and Sexual Activity  . Alcohol use: No  . Drug use: No  . Sexual activity: Not Currently  Lifestyle  . Physical  activity    Days per week: Not on file    Minutes per session: Not on file  . Stress: Not on file  Relationships  . Social Herbalist on phone: Not on file    Gets together: Not on file    Attends religious service: Not on file    Active member of club or organization: Not on file    Attends meetings of clubs or organizations: Not on file    Relationship status: Not on file  . Intimate partner violence    Fear of current or ex partner: Not on file    Emotionally abused: Not on file    Physically abused: Not on file    Forced sexual activity: Not on file  Other Topics Concern  . Not on file  Social History Narrative  . Not on file    FAMILY HISTORY:  Family History  Problem Relation Age of Onset  . Heart attack Mother        died in her 39s  . Other Father 35       pneumonia - died  . Breast cancer Sister        dx in her 50s-60s  . Colon cancer Brother        dx in his 7s  . Cancer Sister        TAH/BSO due to "male cancer"  . Breast cancer Daughter 59  . Breast cancer Daughter 53       DCIS - ATM VUS on [facility] 83 gene panel in 2018  . Breast cancer Daughter 58       DCIS - Negative on [facility] MyRisk 25 gene apnel in 2015  . Thyroid cancer Daughter 3       Medullary and Papillary  . Thyroid nodules Grandchild     CURRENT MEDICATIONS:  Outpatient Encounter Medications as of 11/30/2018  Medication Sig  . abiraterone acetate (ZYTIGA) 250 MG tablet TAKE 2 TABLETS BY MOUTH ONCE DAILY AS DIRECTED.  TAKE 1 HOUR BEFORE OR 2 HOURS AFTER A MEAL  . acetaminophen (TYLENOL) 500 MG tablet Take 500-1,000 mg by mouth every 8 (eight) hours as needed for mild pain, fever or headache.   . albuterol (PROVENTIL HFA;VENTOLIN HFA) 108 (90 Base) MCG/ACT inhaler Inhale 2 puffs into the lungs every 6 (six) hours as needed for wheezing or shortness of breath.  Marland Kitchen apixaban (ELIQUIS) 5 MG TABS tablet Take 5 mg by mouth 2 (two) times daily.  . Calcium Polycarbophil (FIBER-CAPS PO)  Take 1 capsule by mouth 2 (two) times daily.  . Cholecalciferol (VITAMIN D3) 1000 units CAPS Take 1,000 Units by mouth every morning.   . Cranberry-Vitamin C-Vitamin E (CRANBERRY PLUS VITAMIN C) 4200-20-3 MG-MG-UNIT CAPS Take 1 capsule by mouth every morning.  . denosumab (XGEVA) 120 MG/1.7ML SOLN injection Inject 120 mg  into the skin every 30 (thirty) days.   Marland Kitchen diltiazem (CARDIZEM) 60 MG tablet Take 1 tablet (60 mg total) by mouth 2 (two) times daily.  . diphenhydrAMINE-APAP, sleep, (TYLENOL PM EXTRA STRENGTH PO) Take 1 tablet by mouth at bedtime.   . donepezil (ARICEPT) 10 MG tablet Take 10 mg by mouth daily.  . feeding supplement, ENSURE ENLIVE, (ENSURE ENLIVE) LIQD Take 237 mLs by mouth daily. CHOCOLATE   . fluticasone (FLONASE) 50 MCG/ACT nasal spray Place 2 sprays into both nostrils daily.  . furosemide (LASIX) 20 MG tablet Take 1 tablet (20 mg total) by mouth daily.  Marland Kitchen glipiZIDE (GLUCOTROL XL) 5 MG 24 hr tablet Take 5 mg by mouth daily with breakfast.   . guaiFENesin (MUCINEX) 600 MG 12 hr tablet Take 600 mg by mouth 2 (two) times daily as needed for to loosen phlegm.  Marland Kitchen Leuprolide Acetate, 6 Month, (LUPRON DEPOT) 45 MG injection Inject 45 mg into the muscle every 6 (six) months.  Marland Kitchen lisinopril (PRINIVIL,ZESTRIL) 20 MG tablet Take 1 tablet (20 mg total) by mouth daily.  Marland Kitchen loperamide (IMODIUM) 2 MG capsule Take 1 capsule (2 mg total) by mouth as needed for diarrhea or loose stools.  . metFORMIN (GLUCOPHAGE) 850 MG tablet Take 850 mg by mouth 2 (two) times daily with a meal.   . mirtazapine (REMERON) 7.5 MG tablet Take 7.5 mg by mouth at bedtime.  . Multiple Vitamin (MULTIVITAMIN WITH MINERALS) TABS tablet Take 1 tablet by mouth every morning.  Marland Kitchen omeprazole (PRILOSEC) 40 MG capsule Take 40 mg by mouth every other day.   . potassium chloride (K-DUR,KLOR-CON) 10 MEQ tablet Take 2 tablets (20 mEq total) by mouth daily. (Patient taking differently: Take 10 mEq by mouth 2 (two) times daily. )  .  predniSONE (DELTASONE) 5 MG tablet Take 1/2 (one-half) tablet by mouth twice daily  . trimethoprim (TRIMPEX) 100 MG tablet Take 100 mg by mouth at bedtime.  . Cyanocobalamin (VITAMIN B-12 IJ) Inject 1,000 mcg as directed every 30 (thirty) days.   Marland Kitchen nystatin (MYCOSTATIN) powder Apply 1 g topically 4 (four) times daily as needed (fungus).   . [DISCONTINUED] Calcium Carb-Cholecalciferol (CALCIUM 600+D) 600-800 MG-UNIT TABS Take 1 tablet by mouth daily.   . [DISCONTINUED] potassium chloride (MICRO-K) 10 MEQ CR capsule Take 10 mEq by mouth 2 (two) times daily.  . [DISCONTINUED] sulfamethoxazole-trimethoprim (BACTRIM DS) 800-160 MG tablet TAKE 1 TABLET BY MOUTH TWICE A WEEK   No facility-administered encounter medications on file as of 11/30/2018.     ALLERGIES:  Allergies  Allergen Reactions  . Tape Other (See Comments)    SKIN IS VERY THIN AND WILL TEAR AND BRUISE VERY EASILY!!!!  . Augmentin [Amoxicillin-Pot Clavulanate] Rash and Other (See Comments)    Redness of skin     PHYSICAL EXAM:  ECOG Performance status: 1  Vitals:   11/30/18 1122  BP: (!) 121/52  Pulse: 68  Resp: 16  Temp: 97.9 F (36.6 C)  SpO2: 100%   There were no vitals filed for this visit.  Physical Exam Constitutional:      Appearance: Normal appearance. He is normal weight.  Cardiovascular:     Rate and Rhythm: Normal rate and regular rhythm.     Heart sounds: Normal heart sounds.  Pulmonary:     Effort: Pulmonary effort is normal.     Breath sounds: Normal breath sounds.  Abdominal:     General: There is no distension.     Palpations: Abdomen is soft.  There is no mass.  Musculoskeletal:        General: No swelling.  Skin:    General: Skin is warm.  Neurological:     Mental Status: He is alert and oriented to person, place, and time. Mental status is at baseline.  Psychiatric:        Mood and Affect: Mood normal.        Behavior: Behavior normal.      LABORATORY DATA:  I have reviewed the  labs as listed.  CBC    Component Value Date/Time   WBC 7.7 11/01/2018 1220   RBC 3.78 (L) 11/01/2018 1220   HGB 12.4 (L) 11/01/2018 1220   HCT 35.3 (L) 11/01/2018 1220   PLT 142 (L) 11/01/2018 1220   MCV 93.4 11/01/2018 1220   MCH 32.8 11/01/2018 1220   MCHC 35.1 11/01/2018 1220   RDW 13.5 11/01/2018 1220   LYMPHSABS 2.3 11/01/2018 1220   MONOABS 1.1 (H) 11/01/2018 1220   EOSABS 0.0 11/01/2018 1220   BASOSABS 0.0 11/01/2018 1220   CMP Latest Ref Rng & Units 11/30/2018 11/01/2018 10/04/2018  Glucose 70 - 99 mg/dL 143(H) 126(H) 108(H)  BUN 8 - 23 mg/dL _0 Creatinine 0.61 - 1.24 mg/dL 0.92 0.79 1.01  Sodium 135 - 145 mmol/L 138 138 140  Potassium 3.5 - 5.1 mmol/L 4.7 3.8 4.4  Chloride 98 - 111 mmol/L 104 104 106  CO2 22 - 32 mmol/L _1 Calcium 8.9 - 10.3 mg/dL 10.1 10.2 10.1  Total Protein 6.5 - 8.1 g/dL 6.7 6.8 6.8  Total Bilirubin 0.3 - 1.2 mg/dL 0.6 0.8 0.6  Alkaline Phos 38 - 126 U/L 48 61 70  AST 15 - 41 U/L _2 ALT 0 - 44 U/L _3 DIAGNOSTIC IMAGING:  I have independently reviewed the scans and discussed with the patient.     ASSESSMENT & PLAN:   Prostate cancer ([place]) 1.  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 - Last Lupron injection 45 mg on 09/05/2018. - Last bone scan on 08/02/2017 shows interval improvement in T12 lesion.  No new lesions. - Abiraterone dose reduced to 5 mg once a day and prednisone 2.5 mg twice daily on 02/10/2018. - He denies any GI side effects.  He does report some swallowing difficulties which are chronic. -I have suggested him to reach out to the drug company to see if they can provide 500 mg tablet if it is not with bigger size.  Otherwise he can ask them to see if he can break up the pills and take them with applesauce. -We reviewed his labs.  PSA on 11/11/2018 was undetectable.  He will continue Abiraterone at the same  dose. -I will see him back in 2 months for follow-up with repeat PSA.  2.  Bone strengthening: -Calcium is normal.  He will proceed with denosumab today.  3.  IgM MGUS: -Last M spike was 0.4 g in April 2020.  I plan to repeat his myeloma labs in 2 months.  4.  DLBCL: -Diagnosed in 2005, he does not have any B symptoms or lymphadenopathy.  Total time spent is 25 minutes with more than 50% of the time spent face-to-face discussing treatment plan, counseling and coordination of care.    Orders placed this encounter:  Orders Placed This Encounter  Procedures  . CBC with  Differential/Platelet  . Comprehensive metabolic panel  . Protein electrophoresis, serum  . Kappa/lambda light chains  . Lactate dehydrogenase  . PSA      [name], MD [place] [phone]

## 2018-11-30 NOTE — Assessment & Plan Note (Signed)
1.  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 - Last Lupron injection 45 mg on 09/05/2018. - Last bone scan on 08/02/2017 shows interval improvement in T12 lesion.  No new lesions. - Abiraterone dose reduced to 5 mg once a day and prednisone 2.5 mg twice daily on 02/10/2018. - He denies any GI side effects.  He does report some swallowing difficulties which are chronic. -I have suggested him to reach out to the drug company to see if they can provide 500 mg tablet if it is not with bigger size.  Otherwise he can ask them to see if he can break up the pills and take them with applesauce. -We reviewed his labs.  PSA on 11/11/2018 was undetectable.  He will continue Abiraterone at the same dose. -I will see him back in 2 months for follow-up with repeat PSA.  2.  Bone strengthening: -Calcium is normal.  He will proceed with denosumab today.  3.  IgM MGUS: -Last M spike was 0.4 g in April 2020.  I plan to repeat his myeloma labs in 2 months.  4.  DLBCL: -Diagnosed in 2005, he does not have any B symptoms or lymphadenopathy.

## 2018-12-06 DIAGNOSIS — E1165 Type 2 diabetes mellitus with hyperglycemia: Secondary | ICD-10-CM | POA: Diagnosis not present

## 2018-12-06 DIAGNOSIS — C8580 Other specified types of non-Hodgkin lymphoma, unspecified site: Secondary | ICD-10-CM | POA: Diagnosis not present

## 2018-12-06 DIAGNOSIS — I4821 Permanent atrial fibrillation: Secondary | ICD-10-CM | POA: Diagnosis not present

## 2018-12-06 DIAGNOSIS — E43 Unspecified severe protein-calorie malnutrition: Secondary | ICD-10-CM | POA: Diagnosis not present

## 2018-12-10 ENCOUNTER — Other Ambulatory Visit (HOSPITAL_COMMUNITY): Payer: Self-pay | Admitting: Nurse Practitioner

## 2018-12-27 ENCOUNTER — Other Ambulatory Visit (HOSPITAL_COMMUNITY): Payer: Self-pay | Admitting: Emergency Medicine

## 2018-12-27 ENCOUNTER — Telehealth: Payer: Self-pay | Admitting: Cardiology

## 2018-12-27 DIAGNOSIS — C49A0 Gastrointestinal stromal tumor, unspecified site: Secondary | ICD-10-CM

## 2018-12-27 MED ORDER — FUROSEMIDE 20 MG PO TABS: 20.0000 mg | ORAL_TABLET | Freq: Every day | ORAL | 3 refills | Status: DC

## 2018-12-27 NOTE — Telephone Encounter (Signed)
States that patient needs Eliquis but she doesn't know if he needs samples or a RX / tg

## 2018-12-27 NOTE — Telephone Encounter (Signed)
Sent new RX to pharmacy

## 2018-12-28 ENCOUNTER — Inpatient Hospital Stay (HOSPITAL_COMMUNITY): Payer: Medicare Other

## 2018-12-28 ENCOUNTER — Inpatient Hospital Stay (HOSPITAL_COMMUNITY): Payer: Medicare Other | Attending: Hematology

## 2018-12-28 ENCOUNTER — Other Ambulatory Visit: Payer: Self-pay

## 2018-12-28 ENCOUNTER — Ambulatory Visit (HOSPITAL_COMMUNITY): Payer: Medicare Other | Admitting: Hematology

## 2018-12-28 ENCOUNTER — Ambulatory Visit (HOSPITAL_COMMUNITY): Payer: Medicare Other

## 2018-12-28 ENCOUNTER — Other Ambulatory Visit (HOSPITAL_COMMUNITY): Payer: Medicare Other

## 2018-12-28 ENCOUNTER — Encounter (HOSPITAL_COMMUNITY): Payer: Self-pay

## 2018-12-28 VITALS — BP 137/72 | HR 63 | Temp 97.8°F | Resp 18

## 2018-12-28 DIAGNOSIS — C7951 Secondary malignant neoplasm of bone: Secondary | ICD-10-CM | POA: Insufficient documentation

## 2018-12-28 DIAGNOSIS — C61 Malignant neoplasm of prostate: Secondary | ICD-10-CM | POA: Diagnosis not present

## 2018-12-28 DIAGNOSIS — C49A0 Gastrointestinal stromal tumor, unspecified site: Secondary | ICD-10-CM

## 2018-12-28 LAB — COMPREHENSIVE METABOLIC PANEL
ALT: 14 U/L (ref 0–44)
AST: 16 U/L (ref 15–41)
Albumin: 3.7 g/dL (ref 3.5–5.0)
Alkaline Phosphatase: 62 U/L (ref 38–126)
Anion gap: 7 (ref 5–15)
BUN: 20 mg/dL (ref 8–23)
CO2: 23 mmol/L (ref 22–32)
Calcium: 10.2 mg/dL (ref 8.9–10.3)
Chloride: 106 mmol/L (ref 98–111)
Creatinine, Ser: 0.81 mg/dL (ref 0.61–1.24)
GFR calc Af Amer: >60 mL/min (ref >60)
GFR calc non Af Amer: >60 mL/min (ref >60)
Glucose, Bld: 146 mg/dL — ABNORMAL HIGH (ref 70–99)
Potassium: 4.7 mmol/L (ref 3.5–5.1)
Sodium: 136 mmol/L (ref 135–145)
Total Bilirubin: 0.9 mg/dL (ref 0.3–1.2)
Total Protein: 6.4 g/dL — ABNORMAL LOW (ref 6.5–8.1)

## 2018-12-28 MED ORDER — DENOSUMAB 120 MG/1.7ML ~~LOC~~ SOLN
120.0000 mg | Freq: Once | SUBCUTANEOUS | Status: AC
  Administered 2018-12-28: 120 mg via SUBCUTANEOUS
  Filled 2018-12-28: qty 1.7

## 2018-12-28 NOTE — Progress Notes (Signed)
Patient tolerated injection with no complaints voiced.  Site clean and dry with no bruising or swelling noted at site.  Band aid applied.  Vss with discharge and left by wheelchair with no s/s of distress noted.  

## 2019-01-06 ENCOUNTER — Encounter (HOSPITAL_COMMUNITY): Payer: Self-pay | Admitting: Emergency Medicine

## 2019-01-06 ENCOUNTER — Other Ambulatory Visit: Payer: Self-pay

## 2019-01-06 ENCOUNTER — Emergency Department (HOSPITAL_COMMUNITY): Payer: Medicare Other

## 2019-01-06 ENCOUNTER — Emergency Department (HOSPITAL_COMMUNITY)
Admission: EM | Admit: 2019-01-06 | Discharge: 2019-01-06 | Disposition: A | Discharge status: Home / Self Care | Payer: Medicare Other | Attending: Emergency Medicine | Admitting: Emergency Medicine

## 2019-01-06 DIAGNOSIS — R059 Cough, unspecified: Secondary | ICD-10-CM

## 2019-01-06 DIAGNOSIS — I4891 Unspecified atrial fibrillation: Secondary | ICD-10-CM | POA: Insufficient documentation

## 2019-01-06 DIAGNOSIS — R197 Diarrhea, unspecified: Secondary | ICD-10-CM | POA: Diagnosis not present

## 2019-01-06 DIAGNOSIS — R531 Weakness: Secondary | ICD-10-CM

## 2019-01-06 DIAGNOSIS — E119 Type 2 diabetes mellitus without complications: Secondary | ICD-10-CM | POA: Diagnosis not present

## 2019-01-06 DIAGNOSIS — I10 Essential (primary) hypertension: Secondary | ICD-10-CM | POA: Insufficient documentation

## 2019-01-06 DIAGNOSIS — R05 Cough: Secondary | ICD-10-CM | POA: Diagnosis not present

## 2019-01-06 DIAGNOSIS — Z8546 Personal history of malignant neoplasm of prostate: Secondary | ICD-10-CM | POA: Diagnosis not present

## 2019-01-06 LAB — CBC WITH DIFFERENTIAL/PLATELET
Abs Immature Granulocytes: 0.17 10*3/uL — ABNORMAL HIGH (ref 0.00–0.07)
Basophils Absolute: 0 10*3/uL (ref 0.0–0.1)
Basophils Relative: 0 %
Eosinophils Absolute: 0 10*3/uL (ref 0.0–0.5)
Eosinophils Relative: 0 %
HCT: 34.8 % — ABNORMAL LOW (ref 39.0–52.0)
Hemoglobin: 11.9 g/dL — ABNORMAL LOW (ref 13.0–17.0)
Immature Granulocytes: 2 %
Lymphocytes Relative: 26 %
Lymphs Abs: 2.2 10*3/uL (ref 0.7–4.0)
MCH: 32.3 pg (ref 26.0–34.0)
MCHC: 34.2 g/dL (ref 30.0–36.0)
MCV: 94.6 fL (ref 80.0–100.0)
Monocytes Absolute: 1.3 10*3/uL — ABNORMAL HIGH (ref 0.1–1.0)
Monocytes Relative: 15 %
Neutro Abs: 4.6 10*3/uL (ref 1.7–7.7)
Neutrophils Relative %: 57 %
Platelets: 149 10*3/uL — ABNORMAL LOW (ref 150–400)
RBC: 3.68 MIL/uL — ABNORMAL LOW (ref 4.22–5.81)
RDW: 12.5 % (ref 11.5–15.5)
WBC: 8.2 10*3/uL (ref 4.0–10.5)
nRBC: 0 % (ref 0.0–0.2)

## 2019-01-06 LAB — URINALYSIS, ROUTINE W REFLEX MICROSCOPIC
Bilirubin Urine: NEGATIVE
Glucose, UA: NEGATIVE mg/dL
Hgb urine dipstick: NEGATIVE
Ketones, ur: NEGATIVE mg/dL
Leukocytes,Ua: NEGATIVE
Nitrite: NEGATIVE
Protein, ur: NEGATIVE mg/dL
Specific Gravity, Urine: 1.018 (ref 1.005–1.030)
pH: 5 (ref 5.0–8.0)

## 2019-01-06 LAB — COMPREHENSIVE METABOLIC PANEL
ALT: 14 U/L (ref 0–44)
AST: 16 U/L (ref 15–41)
Albumin: 3.7 g/dL (ref 3.5–5.0)
Alkaline Phosphatase: 59 U/L (ref 38–126)
Anion gap: 5 (ref 5–15)
BUN: 20 mg/dL (ref 8–23)
CO2: 28 mmol/L (ref 22–32)
Calcium: 10.6 mg/dL — ABNORMAL HIGH (ref 8.9–10.3)
Chloride: 104 mmol/L (ref 98–111)
Creatinine, Ser: 0.84 mg/dL (ref 0.61–1.24)
GFR calc Af Amer: >60 mL/min (ref >60)
GFR calc non Af Amer: >60 mL/min (ref >60)
Glucose, Bld: 122 mg/dL — ABNORMAL HIGH (ref 70–99)
Potassium: 4.1 mmol/L (ref 3.5–5.1)
Sodium: 137 mmol/L (ref 135–145)
Total Bilirubin: 0.7 mg/dL (ref 0.3–1.2)
Total Protein: 6.3 g/dL — ABNORMAL LOW (ref 6.5–8.1)

## 2019-01-06 LAB — TROPONIN I (HIGH SENSITIVITY)
Troponin I (High Sensitivity): 4 ng/L (ref <18)
Troponin I (High Sensitivity): 4 ng/L (ref <18)

## 2019-01-06 LAB — LIPASE, BLOOD: Lipase: 22 U/L (ref 11–51)

## 2019-01-06 MED ORDER — SODIUM CHLORIDE 0.9 % IV BOLUS
500.0000 mL | Freq: Once | INTRAVENOUS | Status: AC
  Administered 2019-01-06: 500 mL via INTRAVENOUS

## 2019-01-06 NOTE — ED Provider Notes (Signed)
Emergency Department Provider Note   I have reviewed the triage vital signs and the nursing notes.   HISTORY  Chief Complaint Cough   HPI [name] is a 85 y.o. male with PMH of A-fib, DM, GERD, metastatic prostate cancer on denosumab, IgM MGUS, and remote NHL history presents to the emergency department for evaluation of productive cough with generalized weakness over the past 3 days.  Patient was seen yesterday at [facility] and had a COVID test at that time which was negative.  Patient has not had fevers, chills, sore throat.  Denies abdominal pain or nausea.  He did have 2 episodes of nonbloody diarrhea this morning with worsening weakness and so presents to the emergency department for evaluation.  No recent antibiotics.  Denies any chest pain or heart palpitations.  No sick contacts.  Past Medical History:  Diagnosis Date   Acute bronchitis    Arthritis    Atrial fibrillation (HCC)    Dr. Harl Bowie- [facility] follows saw [phone]   Bladder stones    tx. 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 Margot Chimes, thus far without recurrence.    Hypertension    NHL (non-Hodgkin's lymphoma) ([place]) 2005   Diffuse large B-cell lymphoma, clinically stage IIIA, CD20 positive, status post cervical lymph node biopsy 08/03/2003 on the left. PET scan was also positive in the spleen and small bowel region, but bone  marrow aspiration and biopsy were negative. So he essentially had stage IIIAs. He received R-CHOP x6 cycles with CR established by PET scan criteria on 11/23/2003 with no evidence for relapse th   PAD (peripheral artery disease) ([place]) 08/27/2017   Skin cancer    Basal cell- face,head, neck,  arms, legs, Back    Patient Active Problem List   Diagnosis Date Noted   Sepsis secondary to UTI ([place]) 07/02/2018   HCAP (healthcare-associated pneumonia) 07/02/2018   CAP (community acquired pneumonia) 04/16/2018   Dementia without behavioral disturbance ([place]) 04/16/2018   Dehydration 90/30/0923   Acute metabolic encephalopathy 30/10/6224   Encephalopathy acute 01/31/2018   Aspiration pneumonia ([place]) 01/31/2018   Diarrhea 11/25/2017   Acute lower UTI 11/25/2017   PAD (peripheral artery disease) ([place]) 08/27/2017   Great toe amputation status, left 07/30/2017   Osteomyelitis of toe of left foot ([place]) 06/29/2017   Acute bronchitis with bronchospasm 03/16/2017   Type 2 diabetes mellitus ([place]) 03/16/2017   Hypomagnesemia 03/16/2017   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   Postural dizziness with presyncope 02/19/2017   Polyneuropathy 02/19/2017   Numbness 02/19/2017   Urinary frequency 02/19/2017   Genetic testing 02/09/2017   Family history of breast cancer    Family history of colon cancer    Other dysphagia 01/21/2016   Bone metastases ([place]) 08/21/2015   Prostate cancer ([place]) 09/08/2013   Hypertrophy of prostate with urinary obstruction and other  lower urinary tract symptoms (LUTS) 08/10/2013   NHL (non-Hodgkin's lymphoma) ([place]) 08/18/2011   GIST (gastrointestinal stromal tumor), malignant ([place]) 08/18/2011   Wei Newbrough term (current) use of anticoagulants 07/18/2010   Essential hypertension 10/04/2008    Past Surgical History:  Procedure Laterality Date   AMPUTATION Left 06/29/2017   Procedure: AMPUTATION LEFT GREAT TOE;  Surgeon: [name], MD;  Location: [facility];  Service: Vascular;  Laterality: Left;   AMPUTATION Left 08/27/2017   Procedure: AMPUTATION Left SECOND TOE;  Surgeon: [name], MD;  Location: [facility];  Service: Vascular;  Laterality: Left;   CATARACT EXTRACTION Bilateral 2015   CHOLECYSTECTOMY     COLON RESECTION     small bowel   CYSTOSCOPY WITH LITHOLAPAXY N/A 08/10/2013   Procedure: CYSTOSCOPY WITH LITHOLAPAXY WITH [name];  Surgeon: [name], MD;  Location[facility];  Service: Urology;  Laterality: N/A;   ESOPHAGEAL DILATION N/A 02/27/2016   Procedure: ESOPHAGEAL DILATION;  Surgeon: [name], MD;  [facility];  Service: Endoscopy;  Laterality: N/A;   ESOPHAGEAL DILATION N/A 07/07/2018   Procedure: ESOPHAGEAL DILATION;  Surgeon: [name], MD;  [facility];  Service: Endoscopy;  Laterality: N/A;   ESOPHAGOGASTRODUODENOSCOPY N/A 02/27/2016   Procedure: ESOPHAGOGASTRODUODENOSCOPY (EGD);  Surgeon: [name], MD;  [facility];  Service: Endoscopy;  Laterality: N/A;  12:00   ESOPHAGOGASTRODUODENOSCOPY N/A 07/07/2018   Procedure: ESOPHAGOGASTRODUODENOSCOPY (EGD);  Surgeon: [name], MD;  [facility];  Service: Endoscopy;  Laterality: N/A;   INCISION AND DRAINAGE PERIRECTAL ABSCESS     LOWER EXTREMITY ANGIOGRAPHY N/A 06/25/2017   Procedure: LOWER EXTREMITY ANGIOGRAPHY;  Surgeon: [name], MD;  Location: [facility];  Service: Cardiovascular;  Laterality: N/A;   LYMPH NODE DISSECTION Left    '05-neck   PORT-A-CATH REMOVAL     PORTACATH PLACEMENT     insertion and removal -last chemotherapy 10 yrs ago   TRANSURETHRAL RESECTION OF PROSTATE N/A 08/10/2013   Procedure: TRANSURETHRAL RESECTION OF THE PROSTATE WITH GYRUS INSTRUMENTS;  Surgeon: [name], MD;  Location: [facility];  Service: Urology;  Laterality: N/A;    Allergies Tape and Augmentin [amoxicillin-pot clavulanate]  Family History    Problem Relation Age of Onset   Heart attack Mother        died in her 26s   Other Father 33       pneumonia - died   Breast cancer Sister        dx in her 21s-60s   Colon cancer Brother        dx in his 22s   Cancer Sister        TAH/BSO due to "male cancer"   Breast cancer Daughter 14   Breast cancer Daughter 64       DCIS - ATM VUS on [employer] 83 gene panel in 2018   Breast cancer Daughter 22       DCIS - Negative on [employer] MyRisk 25 gene apnel in 2015   Thyroid cancer Daughter 63       Medullary and Papillary   Thyroid nodules Grandchild     Social History Social History   Tobacco Use   Smoking status: Never Smoker   Smokeless tobacco: Never Used  Substance Use Topics   Alcohol use: No   Drug use: No    Review of Systems  Constitutional: No fever/chills. Positive generalized weakness.  Eyes: No visual changes. ENT: No  sore throat. Cardiovascular: Denies chest pain. Respiratory: Denies shortness of breath. Positive cough.  Gastrointestinal: No abdominal pain.  No nausea, no vomiting. Positive diarrhea.  No constipation. Genitourinary: Negative for dysuria. Musculoskeletal: Negative for back pain. Skin: Negative for rash. Neurological: Negative for headaches, focal weakness or numbness.  10-point ROS otherwise negative.  ____________________________________________   PHYSICAL EXAM:  VITAL SIGNS: ED Triage Vitals  Enc Vitals Group     BP 01/06/19 1053 129/84     Pulse Rate 01/06/19 1053 87     Resp 01/06/19 1053 18     Temp 01/06/19 1053 97.7 F (36.5 C)     Temp Source 01/06/19 1053 Oral     SpO2 01/06/19 1053 100 %   Constitutional: Alert and oriented. Well appearing and in no acute distress. Eyes: Conjunctivae are normal.  Head: Atraumatic. Nose: No congestion/rhinnorhea. Mouth/Throat: Mucous membranes are moist. Neck: No stridor.   Cardiovascular: Normal rate, regular rhythm. Good peripheral circulation. Grossly normal heart  sounds.   Respiratory: Normal respiratory effort.  No retractions. Lungs CTAB. Gastrointestinal: Soft and nontender. No distention.  Musculoskeletal: No lower extremity tenderness nor edema. No gross deformities of extremities. Neurologic:  Normal speech and language. No gross focal neurologic deficits are appreciated.  Skin:  Skin is warm, dry and intact. No rash noted.  ____________________________________________   LABS (all labs ordered are listed, but only abnormal results are displayed)  Labs Reviewed  COMPREHENSIVE METABOLIC PANEL - Abnormal; Notable for the following components:      Result Value   Glucose, Bld 122 (*)    Calcium 10.6 (*)    Total Protein 6.3 (*)    All other components within normal limits  CBC WITH DIFFERENTIAL/PLATELET - Abnormal; Notable for the following components:   RBC 3.68 (*)    Hemoglobin 11.9 (*)    HCT 34.8 (*)    Platelets 149 (*)    Monocytes Absolute 1.3 (*)    Abs Immature Granulocytes 0.17 (*)    All other components within normal limits  URINALYSIS, ROUTINE W REFLEX MICROSCOPIC - Abnormal; Notable for the following components:   APPearance HAZY (*)    All other components within normal limits  URINE CULTURE  LIPASE, BLOOD  TROPONIN I (HIGH SENSITIVITY)  TROPONIN I (HIGH SENSITIVITY)   ____________________________________________  EKG   EKG Interpretation  Date/Time:  Friday January 06 2019 12:07:49 EDT Ventricular Rate:  86 PR Interval:    QRS Duration: 142 QT Interval:  385 QTC Calculation: 461 R Axis:   72 Text Interpretation:  Atrial fibrillation Right bundle branch block No STEMI  Confirmed by Nanda Quinton [phone]) on 01/06/2019 12:53:31 PM       ____________________________________________  RADIOLOGY  Dg Chest 2 View  Result Date: 01/06/2019 CLINICAL DATA:  Productive cough for 3 days. EXAM: CHEST - 2 VIEW COMPARISON:  Radiographs 07/02/2018.  CT 03/16/2017. FINDINGS: The heart size and mediastinal contours  are stable with aortic atherosclerosis. There is mild chronic central airway thickening. The right basilar infiltrates noted previously have cleared. The lungs are now clear. There is no pleural effusion or pneumothorax. No acute osseous findings. Telemetry leads overlie the chest. IMPRESSION: Interval clearing of right basilar infiltrates. No acute cardiopulmonary process. Chronic central airway thickening and aortic atherosclerosis. Electronically Signed   By: Richardean Sale M.D.   On: 01/06/2019 12:32    ____________________________________________   PROCEDURES  Procedure(s) performed:   Procedures  None ____________________________________________   INITIAL IMPRESSION / ASSESSMENT AND PLAN / ED COURSE  Pertinent labs & imaging results that were available during my care of the patient were reviewed by me and considered in my medical decision making (see chart for details).   Patient presents to the emergency department for evaluation of productive cough for the past 3 days with generalized weakness and diarrhea.  COVID test from yesterday is negative.  Patient without fever or significant tachypnea.  No hypoxemia.  Abdomen is diffusely soft and nontender.   Labs and imaging reviewed. No acute findings. UA pending. Care transferred to Dr. Thurnell Garbe pending UA. Anticipate discharge. Patient tolerating PO and ambulatory here in the ED.  ____________________________________________  FINAL CLINICAL IMPRESSION(S) / ED DIAGNOSES  Final diagnoses:  Cough  Generalized weakness     MEDICATIONS GIVEN DURING THIS VISIT:  Medications  sodium chloride 0.9 % bolus 500 mL (0 mLs Intravenous Stopped 01/06/19 1527)    Note:  This document was prepared using Dragon voice recognition software and may include unintentional dictation errors.  Nanda Quinton, MD, [facility] Emergency Medicine    Lakiesha Ralphs, [facility], MD 01/06/19 [phone]

## 2019-01-06 NOTE — ED Triage Notes (Signed)
Patient reports productive cough x 3 days. Had COVID test at [facility] on 9/17 with negative result. Denies fever.

## 2019-01-06 NOTE — ED Notes (Signed)
Patient stated he would "attempt to provide [place] with a urine".

## 2019-01-06 NOTE — ED Notes (Signed)
Advised patient we needed urine specimen.  Urinal left at bedside.  °

## 2019-01-06 NOTE — Discharge Instructions (Signed)
You were seen in the emergency department today with generalized weakness and cough.  Your chest x-ray was normal and lab tests look normal.  You may have been slightly dehydrated.  Please continue to drink plenty of fluids and call your PCP today to schedule a follow-up for next week.  Return to the emergency department any new or suddenly worsening symptoms.

## 2019-01-08 LAB — URINE CULTURE: Culture: <10000 — AB

## 2019-01-09 DIAGNOSIS — J209 Acute bronchitis, unspecified: Secondary | ICD-10-CM | POA: Diagnosis not present

## 2019-01-12 ENCOUNTER — Telehealth: Payer: Self-pay | Admitting: Cardiology

## 2019-01-12 DIAGNOSIS — J209 Acute bronchitis, unspecified: Secondary | ICD-10-CM | POA: Diagnosis not present

## 2019-01-12 DIAGNOSIS — Z23 Encounter for immunization: Secondary | ICD-10-CM | POA: Diagnosis not present

## 2019-01-12 DIAGNOSIS — E1165 Type 2 diabetes mellitus with hyperglycemia: Secondary | ICD-10-CM | POA: Diagnosis not present

## 2019-01-12 NOTE — Telephone Encounter (Signed)
New Message    Patient's daughter calling in for recommendations on a PCP.  Please call patient's daughter to discuss.

## 2019-01-12 NOTE — Telephone Encounter (Signed)
I gave daughter number to [employer] [phone]

## 2019-01-16 DIAGNOSIS — B351 Tinea unguium: Secondary | ICD-10-CM | POA: Diagnosis not present

## 2019-01-16 DIAGNOSIS — E1142 Type 2 diabetes mellitus with diabetic polyneuropathy: Secondary | ICD-10-CM | POA: Diagnosis not present

## 2019-01-18 DIAGNOSIS — Z7984 Long term (current) use of oral hypoglycemic drugs: Secondary | ICD-10-CM | POA: Diagnosis not present

## 2019-01-18 DIAGNOSIS — I10 Essential (primary) hypertension: Secondary | ICD-10-CM | POA: Diagnosis not present

## 2019-01-18 DIAGNOSIS — R269 Unspecified abnormalities of gait and mobility: Secondary | ICD-10-CM | POA: Diagnosis not present

## 2019-01-18 DIAGNOSIS — E1165 Type 2 diabetes mellitus with hyperglycemia: Secondary | ICD-10-CM | POA: Diagnosis not present

## 2019-01-18 DIAGNOSIS — C61 Malignant neoplasm of prostate: Secondary | ICD-10-CM | POA: Diagnosis not present

## 2019-01-18 DIAGNOSIS — C8581 Other specified types of non-Hodgkin lymphoma, lymph nodes of head, face, and neck: Secondary | ICD-10-CM | POA: Diagnosis not present

## 2019-01-18 DIAGNOSIS — M6281 Muscle weakness (generalized): Secondary | ICD-10-CM | POA: Diagnosis not present

## 2019-01-18 DIAGNOSIS — I4821 Permanent atrial fibrillation: Secondary | ICD-10-CM | POA: Diagnosis not present

## 2019-01-18 DIAGNOSIS — E43 Unspecified severe protein-calorie malnutrition: Secondary | ICD-10-CM | POA: Diagnosis not present

## 2019-01-23 DIAGNOSIS — C8581 Other specified types of non-Hodgkin lymphoma, lymph nodes of head, face, and neck: Secondary | ICD-10-CM | POA: Diagnosis not present

## 2019-01-23 DIAGNOSIS — I10 Essential (primary) hypertension: Secondary | ICD-10-CM | POA: Diagnosis not present

## 2019-01-23 DIAGNOSIS — I4821 Permanent atrial fibrillation: Secondary | ICD-10-CM | POA: Diagnosis not present

## 2019-01-23 DIAGNOSIS — C61 Malignant neoplasm of prostate: Secondary | ICD-10-CM | POA: Diagnosis not present

## 2019-01-23 DIAGNOSIS — R269 Unspecified abnormalities of gait and mobility: Secondary | ICD-10-CM | POA: Diagnosis not present

## 2019-01-23 DIAGNOSIS — E43 Unspecified severe protein-calorie malnutrition: Secondary | ICD-10-CM | POA: Diagnosis not present

## 2019-01-25 ENCOUNTER — Inpatient Hospital Stay (HOSPITAL_BASED_OUTPATIENT_CLINIC_OR_DEPARTMENT_OTHER): Payer: Medicare Other | Admitting: Hematology

## 2019-01-25 ENCOUNTER — Ambulatory Visit (HOSPITAL_COMMUNITY)
Admission: RE | Admit: 2019-01-25 | Discharge: 2019-01-25 | Disposition: A | Discharge status: Home / Self Care | Payer: Medicare Other | Source: Ambulatory Visit | Attending: Hematology | Admitting: Hematology

## 2019-01-25 ENCOUNTER — Inpatient Hospital Stay (HOSPITAL_COMMUNITY): Payer: Medicare Other | Attending: Hematology

## 2019-01-25 ENCOUNTER — Inpatient Hospital Stay (HOSPITAL_COMMUNITY): Payer: Medicare Other

## 2019-01-25 ENCOUNTER — Other Ambulatory Visit: Payer: Self-pay

## 2019-01-25 ENCOUNTER — Encounter (HOSPITAL_COMMUNITY): Payer: Self-pay | Admitting: Hematology

## 2019-01-25 VITALS — BP 114/54 | HR 80 | Temp 97.8°F | Resp 18 | Wt 145.9 lb

## 2019-01-25 DIAGNOSIS — I10 Essential (primary) hypertension: Secondary | ICD-10-CM | POA: Diagnosis not present

## 2019-01-25 DIAGNOSIS — I4891 Unspecified atrial fibrillation: Secondary | ICD-10-CM | POA: Diagnosis not present

## 2019-01-25 DIAGNOSIS — R05 Cough: Secondary | ICD-10-CM

## 2019-01-25 DIAGNOSIS — Z800 Family history of malignant neoplasm of digestive organs: Secondary | ICD-10-CM | POA: Diagnosis not present

## 2019-01-25 DIAGNOSIS — Z803 Family history of malignant neoplasm of breast: Secondary | ICD-10-CM | POA: Diagnosis not present

## 2019-01-25 DIAGNOSIS — C7951 Secondary malignant neoplasm of bone: Secondary | ICD-10-CM | POA: Diagnosis not present

## 2019-01-25 DIAGNOSIS — Z7901 Long term (current) use of anticoagulants: Secondary | ICD-10-CM | POA: Diagnosis not present

## 2019-01-25 DIAGNOSIS — C61 Malignant neoplasm of prostate: Secondary | ICD-10-CM | POA: Diagnosis not present

## 2019-01-25 DIAGNOSIS — Z8572 Personal history of non-Hodgkin lymphomas: Secondary | ICD-10-CM | POA: Insufficient documentation

## 2019-01-25 DIAGNOSIS — E119 Type 2 diabetes mellitus without complications: Secondary | ICD-10-CM | POA: Insufficient documentation

## 2019-01-25 DIAGNOSIS — D472 Monoclonal gammopathy: Secondary | ICD-10-CM | POA: Diagnosis not present

## 2019-01-25 DIAGNOSIS — C8581 Other specified types of non-Hodgkin lymphoma, lymph nodes of head, face, and neck: Secondary | ICD-10-CM | POA: Diagnosis not present

## 2019-01-25 DIAGNOSIS — R269 Unspecified abnormalities of gait and mobility: Secondary | ICD-10-CM | POA: Diagnosis not present

## 2019-01-25 DIAGNOSIS — E43 Unspecified severe protein-calorie malnutrition: Secondary | ICD-10-CM | POA: Diagnosis not present

## 2019-01-25 DIAGNOSIS — Z808 Family history of malignant neoplasm of other organs or systems: Secondary | ICD-10-CM | POA: Diagnosis not present

## 2019-01-25 DIAGNOSIS — Z79899 Other long term (current) drug therapy: Secondary | ICD-10-CM | POA: Diagnosis not present

## 2019-01-25 DIAGNOSIS — Z7984 Long term (current) use of oral hypoglycemic drugs: Secondary | ICD-10-CM | POA: Diagnosis not present

## 2019-01-25 DIAGNOSIS — I4821 Permanent atrial fibrillation: Secondary | ICD-10-CM | POA: Diagnosis not present

## 2019-01-25 DIAGNOSIS — R059 Cough, unspecified: Secondary | ICD-10-CM

## 2019-01-25 LAB — CBC WITH DIFFERENTIAL/PLATELET
Abs Immature Granulocytes: 0.22 10*3/uL — ABNORMAL HIGH (ref 0.00–0.07)
Basophils Absolute: 0 10*3/uL (ref 0.0–0.1)
Basophils Relative: 0 %
Eosinophils Absolute: 0 10*3/uL (ref 0.0–0.5)
Eosinophils Relative: 0 %
HCT: 35.3 % — ABNORMAL LOW (ref 39.0–52.0)
Hemoglobin: 11.8 g/dL — ABNORMAL LOW (ref 13.0–17.0)
Immature Granulocytes: 2 %
Lymphocytes Relative: 34 %
Lymphs Abs: 3.4 10*3/uL (ref 0.7–4.0)
MCH: 31.8 pg (ref 26.0–34.0)
MCHC: 33.4 g/dL (ref 30.0–36.0)
MCV: 95.1 fL (ref 80.0–100.0)
Monocytes Absolute: 1 10*3/uL (ref 0.1–1.0)
Monocytes Relative: 10 %
Neutro Abs: 5.4 10*3/uL (ref 1.7–7.7)
Neutrophils Relative %: 54 %
Platelets: 197 10*3/uL (ref 150–400)
RBC: 3.71 MIL/uL — ABNORMAL LOW (ref 4.22–5.81)
RDW: 12.5 % (ref 11.5–15.5)
WBC: 10 10*3/uL (ref 4.0–10.5)
nRBC: 0 % (ref 0.0–0.2)

## 2019-01-25 LAB — COMPREHENSIVE METABOLIC PANEL
ALT: 13 U/L (ref 0–44)
AST: 16 U/L (ref 15–41)
Albumin: 3.7 g/dL (ref 3.5–5.0)
Alkaline Phosphatase: 62 U/L (ref 38–126)
Anion gap: 11 (ref 5–15)
BUN: 23 mg/dL (ref 8–23)
CO2: 22 mmol/L (ref 22–32)
Calcium: 10.6 mg/dL — ABNORMAL HIGH (ref 8.9–10.3)
Chloride: 105 mmol/L (ref 98–111)
Creatinine, Ser: 0.94 mg/dL (ref 0.61–1.24)
GFR calc Af Amer: >60 mL/min (ref >60)
GFR calc non Af Amer: >60 mL/min (ref >60)
Glucose, Bld: 177 mg/dL — ABNORMAL HIGH (ref 70–99)
Potassium: 4.5 mmol/L (ref 3.5–5.1)
Sodium: 138 mmol/L (ref 135–145)
Total Bilirubin: 0.6 mg/dL (ref 0.3–1.2)
Total Protein: 6.7 g/dL (ref 6.5–8.1)

## 2019-01-25 LAB — PSA: Prostatic Specific Antigen: <0.01 ng/mL (ref 0.00–4.00)

## 2019-01-25 LAB — LACTATE DEHYDROGENASE: LDH: 89 U/L — ABNORMAL LOW (ref 98–192)

## 2019-01-25 MED ORDER — LEVOFLOXACIN 500 MG PO TABS: 500.0000 mg | ORAL_TABLET | Freq: Every day | ORAL | 0 refills | Status: DC

## 2019-01-25 MED ORDER — DENOSUMAB 120 MG/1.7ML ~~LOC~~ SOLN
120.0000 mg | Freq: Once | SUBCUTANEOUS | Status: AC
  Administered 2019-01-25: 120 mg via SUBCUTANEOUS
  Filled 2019-01-25: qty 1.7

## 2019-01-25 NOTE — Progress Notes (Signed)
[name] presents today for denosumab injection. Lab work reviewed prior to administration. VSS. Pt reports taking Ca and Vit D as instructed. Pt denies tooth/jaw pain and denies recent or future invasive dental work. Injection tolerated well, see MAR for details. Site clean and dry, band aid applied. Pt discharged in satisfactory condition with follow up instructions.

## 2019-01-25 NOTE — Progress Notes (Signed)
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  The Multi-Gene Panel offered by [facility] includes sequencing and/or deletion duplication testing of the following 80 genes: ALK, APC, ATM, AXIN2,BAP1,  BARD1, BLM, BMPR1A, BRCA1, BRCA2, BRIP1, CASR, CDC73, CDH1, CDK4, CDKN1B, CDKN1C, CDKN2A (p14ARF), CDKN2A (p16INK4a), CEBPA, CHEK2, CTNNA1, DICER1, DIS3L2, EGFR (c.2369C>T, p.Thr790Met variant only), EPCAM (Deletion/duplication testing only), FH, FLCN, GATA2, GPC3, GREM1 (Promoter region deletion/duplication testing only), HOXB13 (c.251G>A, p.Gly84Glu), HRAS, KIT, MAX, MEN1, MET, MITF (c.952G>A, p.Glu318Lys variant only), MLH1, MSH2, MSH3, MSH6, MUTYH, NBN, NF1, NF2, NTHL1, PALB2, PDGFRA, PHOX2B, PMS2, POLD1, POLE, POT1, PRKAR1A, PTCH1, PTEN, RAD50, RAD51C, RAD51D, RB1, RECQL4, RET, RUNX1, SDHAF2, SDHA (sequence changes only), SDHB, SDHC, SDHD, SMAD4, SMARCA4, SMARCB1, SMARCE1, STK11, SUFU, TERT, TERT, TMEM127, TP53, TSC1, TSC2, VHL, WRN and WT1.  The report date is February 08, 2017.    Prostate Foster ([place])  09/08/2013 Initial Diagnosis   Prostate Foster ([place])   02/08/2017 Genetic Testing   ATM Gain (Exons 62-63) VUS identified on the multi-gene panel.  The Multi-Gene Panel offered by [facility] includes sequencing and/or deletion duplication testing of the following 80 genes: ALK, APC, ATM, AXIN2,BAP1,  BARD1, BLM, BMPR1A, BRCA1, BRCA2, BRIP1, CASR,  CDC73, CDH1, CDK4, CDKN1B, CDKN1C, CDKN2A (p14ARF), CDKN2A (p16INK4a), CEBPA, CHEK2, CTNNA1, DICER1, DIS3L2, EGFR (c.2369C>T, p.Thr790Met variant only), EPCAM (Deletion/duplication testing only), FH, FLCN, GATA2, GPC3, GREM1 (Promoter region deletion/duplication testing only), HOXB13 (c.251G>A, p.Gly84Glu), HRAS, KIT, MAX, MEN1, MET, MITF (c.952G>A, p.Glu318Lys variant only), MLH1, MSH2, MSH3, MSH6, MUTYH, NBN, NF1, NF2, NTHL1, PALB2, PDGFRA, PHOX2B, PMS2, POLD1, POLE, POT1, PRKAR1A, PTCH1, PTEN, RAD50, RAD51C, RAD51D, RB1, RECQL4, RET, RUNX1, SDHAF2, SDHA (sequence changes only), SDHB, SDHC, SDHD, SMAD4, SMARCA4, SMARCB1, SMARCE1, STK11, SUFU, TERT, TERT, TMEM127, TP53, TSC1, TSC2, VHL, WRN and WT1.  The report date is February 08, 2017.       Foster STAGING: Foster Staging NHL (non-Hodgkin's lymphoma) (HCC) Staging form: Lymphoid Neoplasms, AJCC 6th Edition - Clinical: Stage III - Signed by [name] Cancer, PA on 08/18/2011    INTERVAL HISTORY:  [name] 85 y.o. male seen for follow-up of prostate Foster.  Tolerating Abiraterone very well.  However he reported cough with expectoration 2 weeks ago.  Seen by his PMD and was given Z-Pak.  Did not help with cough or expectoration.  Denies any fevers or chills.  Appetite and energy levels are 25%.  Denies any ER visits or hospitalizations.  He is continuing to take prednisone 5 mg daily.  REVIEW OF SYSTEMS:  Review of Systems  HENT:   Positive for trouble swallowing.   Respiratory: Positive for cough.   All other systems reviewed and are negative.    PAST MEDICAL/SURGICAL HISTORY:  Past Medical History:  Diagnosis Date   Acute bronchitis    Arthritis    Atrial fibrillation (HCC)    Dr. Harl Foster- [facility] follows saw 11'14   Bladder stones    tx. 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 Margot Foster, thus far without recurrence.    Hypertension    NHL (non-Hodgkin's lymphoma) ([place]) 2005   Diffuse large B-cell lymphoma, clinically stage IIIA, CD20 positive, status post cervical lymph node biopsy 08/03/2003 on the left. PET scan was also positive in the spleen and small bowel region, but bone marrow aspiration and biopsy were negative. So he essentially had stage IIIAs. He received R-CHOP x6 cycles with CR established by PET scan criteria on 11/23/2003 with no evidence for relapse th   PAD (peripheral artery disease) ([place]) 08/27/2017   Skin Foster    Basal cell- face,head, neck,  arms, legs, Back   Past Surgical History:  Procedure Laterality Date   AMPUTATION Left 06/29/2017   Procedure: AMPUTATION LEFT GREAT TOE;  Surgeon: [name] Dutch, MD;  Location: [facility];  Service: Vascular;  Laterality: Left;   AMPUTATION Left 08/27/2017   Procedure: AMPUTATION Left SECOND TOE;  Surgeon: [name] Dutch, MD;  Location: [facility];  Service: Vascular;  Laterality: Left;   CATARACT EXTRACTION Bilateral 2015   CHOLECYSTECTOMY     COLON RESECTION     small bowel   CYSTOSCOPY WITH LITHOLAPAXY N/A 08/10/2013   Procedure: CYSTOSCOPY WITH LITHOLAPAXY WITH [name];  Surgeon: [name] Gallo, MD;  [facility];  Service: Urology;  Laterality: N/A;   ESOPHAGEAL DILATION N/A 02/27/2016   Procedure: ESOPHAGEAL DILATION;  Surgeon: [name] Houston, MD;  [facility];  Service: Endoscopy;  Laterality: N/A;   ESOPHAGEAL DILATION N/A 07/07/2018    Procedure: ESOPHAGEAL DILATION;  Surgeon: [name] Houston, MD;  [facility];  Service: Endoscopy;  Laterality: N/A;   ESOPHAGOGASTRODUODENOSCOPY N/A 02/27/2016   Procedure: ESOPHAGOGASTRODUODENOSCOPY (EGD);  Surgeon: [name] Houston, MD;  [facility];  Service: Endoscopy;  Laterality: N/A;  12:00   ESOPHAGOGASTRODUODENOSCOPY N/A 07/07/2018   Procedure: ESOPHAGOGASTRODUODENOSCOPY (EGD);  Surgeon: [name] Houston, MD;  [facility];  Service: Endoscopy;  Laterality: N/A;   INCISION AND DRAINAGE PERIRECTAL ABSCESS     LOWER EXTREMITY ANGIOGRAPHY N/A 06/25/2017   Procedure: LOWER EXTREMITY ANGIOGRAPHY;  Surgeon: [name] Dutch, MD;  Location: [facility];  Service: Cardiovascular;  Laterality: N/A;   LYMPH NODE DISSECTION Left    '05-neck   PORT-A-CATH REMOVAL     PORTACATH PLACEMENT     insertion and removal -last chemotherapy 10 yrs ago   TRANSURETHRAL RESECTION OF PROSTATE N/A 08/10/2013   Procedure: TRANSURETHRAL RESECTION OF THE PROSTATE WITH GYRUS INSTRUMENTS;  Surgeon: [name] Gallo, MD;  [facility];  Service: Urology;  Laterality: N/A;     SOCIAL HISTORY:  Social History   Socioeconomic History   Marital status: Married    Spouse name: Not on file   Number of children: Not on file   Years of education: Not on file   Highest education level: Not on file  Occupational History   Occupation: retired  Employer: RETIRED    Comment: salesman  Social Designer, fashion/clothing strain: Not on file   Food insecurity    Worry: Not on file    Inability: Not on file   Transportation needs    Medical: Not on file    Non-medical: Not on file  Tobacco Use   Smoking status: Never Smoker   Smokeless tobacco: Never Used  Substance and Sexual Activity   Alcohol use: No   Drug use: No   Sexual activity: Not Currently  Lifestyle   Physical activity    Days per week: Not on file    Minutes per session: Not on  file   Stress: Not on file  Relationships   Social connections    Talks on phone: Not on file    Gets together: Not on file    Attends religious service: Not on file    Active member of club or organization: Not on file    Attends meetings of clubs or organizations: Not on file    Relationship status: Not on file   Intimate partner violence    Fear of current or ex partner: Not on file    Emotionally abused: Not on file    Physically abused: Not on file    Forced sexual activity: Not on file  Other Topics Concern   Not on file  Social History Narrative   Not on file    FAMILY HISTORY:  Family History  Problem Relation Age of Onset   Heart attack Mother        died in her 18s   Other Father 30       pneumonia - died   Breast Foster Sister        dx in her 59s-60s   Colon Foster Brother        dx in his 61s   Foster Sister        TAH/BSO due to "male Foster"   Breast Foster Daughter 69   Breast Foster Daughter 52       DCIS - ATM VUS on [facility] 83 gene panel in 2018   Breast Foster Daughter 37       DCIS - Negative on Myriad MyRisk 25 gene apnel in 2015   Thyroid Foster Daughter 26       Medullary and Papillary   Thyroid nodules Grandchild     CURRENT MEDICATIONS:  Outpatient Encounter Medications as of 01/25/2019  Medication Sig   abiraterone acetate (ZYTIGA) 250 MG tablet TAKE 2 TABLETS BY MOUTH ONCE DAILY AS DIRECTED.  TAKE 1 HOUR BEFORE OR 2 HOURS AFTER A MEAL   apixaban (ELIQUIS) 5 MG TABS tablet Take 5 mg by mouth 2 (two) times daily.   Calcium Polycarbophil (FIBER-CAPS PO) Take 1 capsule by mouth 2 (two) times daily.   Cholecalciferol (VITAMIN D3) 1000 units CAPS Take 1,000 Units by mouth every morning.    Cranberry-Vitamin C-Vitamin E (CRANBERRY PLUS VITAMIN C) 4200-20-3 MG-MG-UNIT CAPS Take 1 capsule by mouth every morning.   Cyanocobalamin (VITAMIN B-12 IJ) Inject 1,000 mcg as directed every 30 (thirty) days.    denosumab (XGEVA) 120  MG/1.7ML SOLN injection Inject 120 mg into the skin every 30 (thirty) days.    diltiazem (CARDIZEM) 60 MG tablet Take 1 tablet (60 mg total) by mouth 2 (two) times daily.   diphenhydrAMINE-APAP, sleep, (TYLENOL PM EXTRA STRENGTH PO) Take 1 tablet by mouth at bedtime.    donepezil (ARICEPT) 10 MG tablet Take  10 mg by mouth daily.   feeding supplement, ENSURE ENLIVE, (ENSURE ENLIVE) LIQD Take 237 mLs by mouth daily. CHOCOLATE    furosemide (LASIX) 20 MG tablet Take 1 tablet (20 mg total) by mouth daily.   glipiZIDE (GLUCOTROL XL) 5 MG 24 hr tablet Take 5 mg by mouth daily with breakfast.    guaiFENesin (MUCINEX) 600 MG 12 hr tablet Take 600 mg by mouth 2 (two) times daily as needed for to loosen phlegm.   Leuprolide Acetate, 6 Month, (LUPRON DEPOT) 45 MG injection Inject 45 mg into the muscle every 6 (six) months.   lisinopril (PRINIVIL,ZESTRIL) 20 MG tablet Take 1 tablet (20 mg total) by mouth daily.   metFORMIN (GLUCOPHAGE) 850 MG tablet Take 850 mg by mouth 2 (two) times daily with a meal.    mirtazapine (REMERON) 7.5 MG tablet Take 7.5 mg by mouth at bedtime.   Multiple Vitamin (MULTIVITAMIN WITH MINERALS) TABS tablet Take 1 tablet by mouth every morning.   omeprazole (PRILOSEC) 40 MG capsule Take 40 mg by mouth every other day.    potassium chloride (K-DUR,KLOR-CON) 10 MEQ tablet Take 2 tablets (20 mEq total) by mouth daily. (Patient taking differently: Take 10 mEq by mouth 2 (two) times daily. )   predniSONE (DELTASONE) 5 MG tablet Take 1/2 (one-half) tablet by mouth twice daily   trimethoprim (TRIMPEX) 100 MG tablet Take 100 mg by mouth at bedtime.   [DISCONTINUED] azithromycin (ZITHROMAX) 250 MG tablet TAKE 2 TABLETS BY MOUTH ON DAY 1 AND THEN TAKE 1 TABLET BY MOUTH ONCE A DAY ON DAY 2 THROUGH DAY 5   [DISCONTINUED] benzonatate (TESSALON) 100 MG capsule Take 100 mg by mouth 3 (three) times daily.   acetaminophen (TYLENOL) 500 MG tablet Take 500-1,000 mg by mouth every 8  (eight) hours as needed for mild pain, fever or headache.    albuterol (PROVENTIL HFA;VENTOLIN HFA) 108 (90 Base) MCG/ACT inhaler Inhale 2 puffs into the lungs every 6 (six) hours as needed for wheezing or shortness of breath.   fluticasone (FLONASE) 50 MCG/ACT nasal spray Place 2 sprays into both nostrils daily.   loperamide (IMODIUM) 2 MG capsule Take 1 capsule (2 mg total) by mouth as needed for diarrhea or loose stools. (Patient not taking: Reported on 01/25/2019)   nystatin (MYCOSTATIN) powder Apply 1 g topically 4 (four) times daily as needed (fungus).    No facility-administered encounter medications on file as of 01/25/2019.     ALLERGIES:  Allergies  Allergen Reactions   Tape Other (See Comments)    SKIN IS VERY THIN AND WILL TEAR AND BRUISE VERY EASILY!!!!   Augmentin [Amoxicillin-Pot Clavulanate] Rash and Other (See Comments)    Redness of skin     PHYSICAL EXAM:  ECOG Performance status: 1  Vitals:   01/25/19 1058  BP: (!) 114/54  Pulse: 80  Resp: 18  Temp: 97.8 F (36.6 C)  SpO2: 100%   Filed Weights   01/25/19 1058  Weight: 145 lb 14.4 oz (66.2 kg)    Physical Exam Constitutional:      Appearance: Normal appearance. He is normal weight.  Cardiovascular:     Rate and Rhythm: Normal rate and regular rhythm.     Heart sounds: Normal heart sounds.  Pulmonary:     Effort: Pulmonary effort is normal.     Breath sounds: Normal breath sounds.  Abdominal:     General: There is no distension.     Palpations: Abdomen is soft. There is no mass.  Musculoskeletal:        General: No swelling.  Skin:    General: Skin is warm.  Neurological:     Mental Status: He is alert and oriented to person, place, and time. Mental status is at baseline.  Psychiatric:        Mood and Affect: Mood normal.        Behavior: Behavior normal.      LABORATORY DATA:  I have reviewed the labs as listed.  CBC    Component Value Date/Time   WBC 10.0 01/25/2019 1023   RBC  3.71 (L) 01/25/2019 1023   HGB 11.8 (L) 01/25/2019 1023   HCT 35.3 (L) 01/25/2019 1023   PLT 197 01/25/2019 1023   MCV 95.1 01/25/2019 1023   MCH 31.8 01/25/2019 1023   MCHC 33.4 01/25/2019 1023   RDW 12.5 01/25/2019 1023   LYMPHSABS 3.4 01/25/2019 1023   MONOABS 1.0 01/25/2019 1023   EOSABS 0.0 01/25/2019 1023   BASOSABS 0.0 01/25/2019 1023   CMP Latest Ref Rng & Units 01/25/2019 01/06/2019 12/28/2018  Glucose 70 - 99 mg/dL 177(H) 122(H) 146(H)  BUN 8 - 23 mg/dL _0 Creatinine 0.61 - 1.24 mg/dL 0.94 0.84 0.81  Sodium 135 - 145 mmol/L 138 137 136  Potassium 3.5 - 5.1 mmol/L 4.5 4.1 4.7  Chloride 98 - 111 mmol/L 105 104 106  CO2 22 - 32 mmol/L _1 Calcium 8.9 - 10.3 mg/dL 10.6(H) 10.6(H) 10.2  Total Protein 6.5 - 8.1 g/dL 6.7 6.3(L) 6.4(L)  Total Bilirubin 0.3 - 1.2 mg/dL 0.6 0.7 0.9  Alkaline Phos 38 - 126 U/L 62 59 62  AST 15 - 41 U/L _2 ALT 0 - 44 U/L _3 DIAGNOSTIC IMAGING:  I have independently reviewed the scans and discussed with the patient.     ASSESSMENT & PLAN:   No problem-specific Assessment & Plan notes found for this encounter.  Total time spent is 25 minutes with more than 50% of the time spent face-to-face discussing treatment plan, counseling and coordination of care.    Orders placed this encounter:  No orders of the defined types were placed in this encounter.     [name], MD [place] [phone]

## 2019-01-25 NOTE — Patient Instructions (Signed)
[place] at [facility] Discharge Instructions  You were seen today by Dr. Delton Coombes. He went over your recent lab results. He will get a Chest xray today. He will see you back in 1 week for labs and follow up.  STOP taking the Zytiga!  Thank you for choosing [place] at [facility] to provide your oncology and hematology care.  To afford each patient quality time with our provider, please arrive at least 15 minutes before your scheduled appointment time.   If you have a lab appointment with the [facility] please come in thru the  Main Entrance and check in at the main information desk  You need to re-schedule your appointment should you arrive 10 or more minutes late.  We strive to give you quality time with our providers, and arriving late affects you and other patients whose appointments are after yours.  Also, if you no show three or more times for appointments you may be dismissed from the clinic at the providers discretion.     Again, thank you for choosing [facility].  Our hope is that these requests will decrease the amount of time that you wait before being seen by our physicians.       _____________________________________________________________  Should you have questions after your visit to [facility], please contact our office at [phone] between the hours of 8:00 a.m. and 4:30 p.m.  Voicemails left after 4:00 p.m. will not be returned until the following business day.  For prescription refill requests, have your pharmacy contact our office and allow 72 hours.    Cancer Center Support Programs:   > Cancer Support Group  2nd Tuesday of the month 1pm-2pm, Journey Room

## 2019-01-25 NOTE — Patient Instructions (Signed)
[place] at [facility]  Discharge Instructions:  Delton See injection administered today. _______________________________________________________________  Thank you for choosing [place] at [facility] to provide your oncology and hematology care.  To afford each patient quality time with our providers, please arrive at least 15 minutes before your scheduled appointment.  You need to re-schedule your appointment if you arrive 10 or more minutes late.  We strive to give you quality time with our providers, and arriving late affects you and other patients whose appointments are after yours.  Also, if you no show three or more times for appointments you may be dismissed from the clinic.  Again, thank you for choosing [place] at [place] hope is that these requests will allow you access to exceptional care and in a timely manner. _______________________________________________________________  If you have questions after your visit, please contact our office at [phone] between the hours of 8:30 a.m. and 5:00 p.m. Voicemails left after 4:30 p.m. will not be returned until the following business day. _______________________________________________________________  For prescription refill requests, have your pharmacy contact our office. _______________________________________________________________  Recommendations made by the consultant and any test results will be sent to your referring physician. _______________________________________________________________

## 2019-01-25 NOTE — Assessment & Plan Note (Signed)
1.  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 - Last Lupron injection 45 mg on 09/05/2018. - Last bone scan on 08/02/2017 shows interval improvement in T12 lesion.  No new lesions. - Abiraterone dose reduced to 500 mg once a day and prednisone 2.5 mg twice daily on 02/10/2018. - He is tolerating Abiraterone very well.  His PSA level continues to be undetectable. - He had cough with yellowish expectoration 2 weeks ago.  He was seen by Dr. Legrand Rams and was given Z-Pak. -He is continuing to have cough with greenish expectoration.  I have done a chest x-ray in the office and reviewed it which was within normal limits. -We will give him Levaquin for 7 days.  I will reevaluate him in 7 days. -We will hold his Abiraterone until next week.  2.  Bone strengthening: -Calcium is 10.4.  He will receive denosumab today.  3.  IgM MGUS: -We sent myeloma labs from today which are pending.  This will be followed next week.  4.  DLBCL: -Diagnosed in 2005, he does not have any B symptoms or lymphadenopathy.

## 2019-01-26 LAB — KAPPA/LAMBDA LIGHT CHAINS
Kappa free light chain: 57.6 mg/L — ABNORMAL HIGH (ref 3.3–19.4)
Kappa, lambda light chain ratio: 0.91 (ref 0.26–1.65)
Lambda free light chains: 63.4 mg/L — ABNORMAL HIGH (ref 5.7–26.3)

## 2019-01-26 LAB — PROTEIN ELECTROPHORESIS, SERUM
A/G Ratio: 1.2 (ref 0.7–1.7)
Albumin ELP: 3.3 g/dL (ref 2.9–4.4)
Alpha-1-Globulin: 0.2 g/dL (ref 0.0–0.4)
Alpha-2-Globulin: 0.8 g/dL (ref 0.4–1.0)
Beta Globulin: 0.7 g/dL (ref 0.7–1.3)
Gamma Globulin: 1 g/dL (ref 0.4–1.8)
Globulin, Total: 2.7 g/dL (ref 2.2–3.9)
M-Spike, %: 0.4 g/dL — ABNORMAL HIGH
Total Protein ELP: 6 g/dL (ref 6.0–8.5)

## 2019-01-30 DIAGNOSIS — I4821 Permanent atrial fibrillation: Secondary | ICD-10-CM | POA: Diagnosis not present

## 2019-01-30 DIAGNOSIS — I10 Essential (primary) hypertension: Secondary | ICD-10-CM | POA: Diagnosis not present

## 2019-01-30 DIAGNOSIS — R269 Unspecified abnormalities of gait and mobility: Secondary | ICD-10-CM | POA: Diagnosis not present

## 2019-01-30 DIAGNOSIS — E43 Unspecified severe protein-calorie malnutrition: Secondary | ICD-10-CM | POA: Diagnosis not present

## 2019-01-30 DIAGNOSIS — C61 Malignant neoplasm of prostate: Secondary | ICD-10-CM | POA: Diagnosis not present

## 2019-01-30 DIAGNOSIS — C8581 Other specified types of non-Hodgkin lymphoma, lymph nodes of head, face, and neck: Secondary | ICD-10-CM | POA: Diagnosis not present

## 2019-02-01 ENCOUNTER — Other Ambulatory Visit (HOSPITAL_COMMUNITY): Payer: Self-pay | Admitting: Hematology

## 2019-02-01 DIAGNOSIS — R059 Cough, unspecified: Secondary | ICD-10-CM

## 2019-02-01 DIAGNOSIS — R05 Cough: Secondary | ICD-10-CM

## 2019-02-02 ENCOUNTER — Ambulatory Visit (HOSPITAL_COMMUNITY): Payer: Medicare Other | Admitting: Nurse Practitioner

## 2019-02-02 ENCOUNTER — Inpatient Hospital Stay (HOSPITAL_COMMUNITY): Payer: Medicare Other

## 2019-02-02 ENCOUNTER — Other Ambulatory Visit: Payer: Self-pay

## 2019-02-02 ENCOUNTER — Inpatient Hospital Stay (HOSPITAL_BASED_OUTPATIENT_CLINIC_OR_DEPARTMENT_OTHER): Payer: Medicare Other | Admitting: Nurse Practitioner

## 2019-02-02 DIAGNOSIS — Z8572 Personal history of non-Hodgkin lymphomas: Secondary | ICD-10-CM | POA: Diagnosis not present

## 2019-02-02 DIAGNOSIS — D472 Monoclonal gammopathy: Secondary | ICD-10-CM | POA: Diagnosis not present

## 2019-02-02 DIAGNOSIS — C61 Malignant neoplasm of prostate: Secondary | ICD-10-CM

## 2019-02-02 DIAGNOSIS — E119 Type 2 diabetes mellitus without complications: Secondary | ICD-10-CM | POA: Diagnosis not present

## 2019-02-02 DIAGNOSIS — I10 Essential (primary) hypertension: Secondary | ICD-10-CM | POA: Diagnosis not present

## 2019-02-02 DIAGNOSIS — C7951 Secondary malignant neoplasm of bone: Secondary | ICD-10-CM | POA: Diagnosis not present

## 2019-02-02 LAB — CBC WITH DIFFERENTIAL/PLATELET
Abs Immature Granulocytes: 0.34 10*3/uL — ABNORMAL HIGH (ref 0.00–0.07)
Basophils Absolute: 0 10*3/uL (ref 0.0–0.1)
Basophils Relative: 0 %
Eosinophils Absolute: 0 10*3/uL (ref 0.0–0.5)
Eosinophils Relative: 1 %
HCT: 34.9 % — ABNORMAL LOW (ref 39.0–52.0)
Hemoglobin: 11.7 g/dL — ABNORMAL LOW (ref 13.0–17.0)
Immature Granulocytes: 4 %
Lymphocytes Relative: 30 %
Lymphs Abs: 2.6 10*3/uL (ref 0.7–4.0)
MCH: 32.1 pg (ref 26.0–34.0)
MCHC: 33.5 g/dL (ref 30.0–36.0)
MCV: 95.9 fL (ref 80.0–100.0)
Monocytes Absolute: 1.1 10*3/uL — ABNORMAL HIGH (ref 0.1–1.0)
Monocytes Relative: 13 %
Neutro Abs: 4.7 10*3/uL (ref 1.7–7.7)
Neutrophils Relative %: 52 %
Platelets: 159 10*3/uL (ref 150–400)
RBC: 3.64 MIL/uL — ABNORMAL LOW (ref 4.22–5.81)
RDW: 13.2 % (ref 11.5–15.5)
WBC: 8.8 10*3/uL (ref 4.0–10.5)
nRBC: 0 % (ref 0.0–0.2)

## 2019-02-02 LAB — COMPREHENSIVE METABOLIC PANEL
ALT: 21 U/L (ref 0–44)
AST: 18 U/L (ref 15–41)
Albumin: 3.5 g/dL (ref 3.5–5.0)
Alkaline Phosphatase: 51 U/L (ref 38–126)
Anion gap: 9 (ref 5–15)
BUN: 28 mg/dL — ABNORMAL HIGH (ref 8–23)
CO2: 18 mmol/L — ABNORMAL LOW (ref 22–32)
Calcium: 9.7 mg/dL (ref 8.9–10.3)
Chloride: 113 mmol/L — ABNORMAL HIGH (ref 98–111)
Creatinine, Ser: 1.06 mg/dL (ref 0.61–1.24)
GFR calc Af Amer: >60 mL/min (ref >60)
GFR calc non Af Amer: >60 mL/min (ref >60)
Glucose, Bld: 178 mg/dL — ABNORMAL HIGH (ref 70–99)
Potassium: 4.5 mmol/L (ref 3.5–5.1)
Sodium: 140 mmol/L (ref 135–145)
Total Bilirubin: 0.4 mg/dL (ref 0.3–1.2)
Total Protein: 6.4 g/dL — ABNORMAL LOW (ref 6.5–8.1)

## 2019-02-02 NOTE — Patient Instructions (Signed)
[facility] at [facility] Discharge Instructions  Follow up in 2 months with labs    Thank you for choosing  [facility] at [facility] to provide your oncology and hematology care.  To afford each patient quality time with our provider, please arrive at least 15 minutes before your scheduled appointment time.   If you have a lab appointment with the Cancer Center please come in thru the Main Entrance and check in at the main information desk.  You need to re-schedule your appointment should you arrive 10 or more minutes late.  We strive to give you quality time with our providers, and arriving late affects you and other patients whose appointments are after yours.  Also, if you no show three or more times for appointments you may be dismissed from the clinic at the providers discretion.     Again, thank you for choosing [facility].  Our hope is that these requests will decrease the amount of time that you wait before being seen by our physicians.       _____________________________________________________________  Should you have questions after your visit to [facility], please contact our office at [phone] between the hours of 8:00 a.m. and 4:30 p.m.  Voicemails left after 4:00 p.m. will not be returned until the following business day.  For prescription refill requests, have your pharmacy contact our office and allow 72 hours.    Due to Covid, you will need to wear a mask upon entering the hospital. If you do not have a mask, a mask will be given to you at the Main Entrance upon arrival. For doctor visits, patients may have 1 support person with them. For treatment visits, patients can not have anyone with them due to social distancing guidelines and our immunocompromised population.      

## 2019-02-02 NOTE — Assessment & Plan Note (Addendum)
1.  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 -Last Lupron injection 45 mg on 08/26/2018. -Last bone scan on 09/01/2017 showed interval improvement in T12 lesion.  No new lesions. -Abiraterone dose reduced to 500 mg once a day and prednisone 2.5 mg twice a day on 02/10/2018. -He is tolerating his abiraterone very well.  His PSA level continues to be undetectable. -He had a cough with yellow sputum 2 weeks ago.  He was seen by Dr. Devin Going and was given a Z-Pak. -He came to our office about a week ago and is still having the same cough with a greenish sputum.  We did a chest x-ray and placed him on Levaquin for 7 days.  We also stopped his abiraterone at that time. -He is here today and doing much better.  We will start his abiraterone on back at the same dose. -He will follow up in 2 months with labs.  2.  Bone strengthening: -Labs on 02/02/2019 showed calcium level 9.7. -He is receiving denosumab monthly  3.  IgM MGUS: -SPEP done on 01/25/2019 showed M spike is stable at 0.4 g/dL light chain ratio of 0.91.  4.  DLBCL: -Diagnosed in 2005, he does not have any B symptoms or lymphadenopathy at this time.

## 2019-02-02 NOTE — Progress Notes (Signed)
[place], [place] [address]   CLINIC:  Medical Oncology/Hematology  PCP:  Rosita Fire, MD [place] [address] [phone]   REASON FOR VISIT: Follow-up for prostate cancer  CURRENT THERAPY: Zytiga  BRIEF ONCOLOGIC HISTORY:  Oncology History  GIST (gastrointestinal stromal tumor), malignant ([place])  08/18/2011 Initial Diagnosis   GIST (gastrointestinal stromal tumor), malignant ([place])   02/08/2017 Genetic Testing   ATM Gain (Exons 62-63) VUS identified on the multi-gene panel.  The Multi-Gene Panel offered by [facility] includes sequencing and/or deletion duplication testing of the following 80 genes: ALK, APC, ATM, AXIN2,BAP1,  BARD1, BLM, BMPR1A, BRCA1, BRCA2, BRIP1, CASR, CDC73, CDH1, CDK4, CDKN1B, CDKN1C, CDKN2A (p14ARF), CDKN2A (p16INK4a), CEBPA, CHEK2, CTNNA1, DICER1, DIS3L2, EGFR (c.2369C>T, p.Thr790Met variant only), EPCAM (Deletion/duplication testing only), FH, FLCN, GATA2, GPC3, GREM1 (Promoter region deletion/duplication testing only), HOXB13 (c.251G>A, p.Gly84Glu), HRAS, KIT, MAX, MEN1, MET, MITF (c.952G>A, p.Glu318Lys variant only), MLH1, MSH2, MSH3, MSH6, MUTYH, NBN, NF1, NF2, NTHL1, PALB2, PDGFRA, PHOX2B, PMS2, POLD1, POLE, POT1, PRKAR1A, PTCH1, PTEN, RAD50, RAD51C, RAD51D, RB1, RECQL4, RET, RUNX1, SDHAF2, SDHA (sequence changes only), SDHB, SDHC, SDHD, SMAD4, SMARCA4, SMARCB1, SMARCE1, STK11, SUFU, TERT, TERT, TMEM127, TP53, TSC1, TSC2, VHL, WRN and WT1.  The report date is February 08, 2017.    Prostate cancer ([place])  09/08/2013 Initial Diagnosis   Prostate cancer ([place])   02/08/2017 Genetic Testing   ATM Gain (Exons 62-63) VUS identified on the multi-gene panel.  The Multi-Gene Panel offered by [facility] includes sequencing and/or deletion duplication testing of the following 80 genes: ALK, APC, ATM, AXIN2,BAP1,  BARD1, BLM, BMPR1A, BRCA1, BRCA2, BRIP1, CASR, CDC73, CDH1, CDK4, CDKN1B, CDKN1C, CDKN2A (p14ARF),  CDKN2A (p16INK4a), CEBPA, CHEK2, CTNNA1, DICER1, DIS3L2, EGFR (c.2369C>T, p.Thr790Met variant only), EPCAM (Deletion/duplication testing only), FH, FLCN, GATA2, GPC3, GREM1 (Promoter region deletion/duplication testing only), HOXB13 (c.251G>A, p.Gly84Glu), HRAS, KIT, MAX, MEN1, MET, MITF (c.952G>A, p.Glu318Lys variant only), MLH1, MSH2, MSH3, MSH6, MUTYH, NBN, NF1, NF2, NTHL1, PALB2, PDGFRA, PHOX2B, PMS2, POLD1, POLE, POT1, PRKAR1A, PTCH1, PTEN, RAD50, RAD51C, RAD51D, RB1, RECQL4, RET, RUNX1, SDHAF2, SDHA (sequence changes only), SDHB, SDHC, SDHD, SMAD4, SMARCA4, SMARCB1, SMARCE1, STK11, SUFU, TERT, TERT, TMEM127, TP53, TSC1, TSC2, VHL, WRN and WT1.  The report date is February 08, 2017.       CANCER STAGING: Cancer Staging NHL (non-Hodgkin's lymphoma) (HCC) Staging form: Lymphoid Neoplasms, AJCC 6th Edition - Clinical: Stage III - Signed by Baird Cancer, PA on 08/18/2011    INTERVAL HISTORY:  [name] 85 y.o. male returns for routine follow-up for prostate cancer.  Patient reports he is feeling much better since completing his antibiotics for his pneumonia.  He is no longer coughing up any sputum.  He denies a cough.  He denies any bleeding. Denies any nausea, vomiting, or diarrhea. Denies any new pains. Had not noticed any recent bleeding such as epistaxis, hematuria or hematochezia. Denies recent chest pain on exertion, shortness of breath on minimal exertion, pre-syncopal episodes, or palpitations. Denies any numbness or tingling in hands or feet. Denies any recent fevers, infections, or recent hospitalizations. Patient reports appetite at 25% and energy level at 0%.     REVIEW OF SYSTEMS:  Review of Systems  Constitutional: Positive for fatigue.  All other systems reviewed and are negative.    PAST MEDICAL/SURGICAL HISTORY:  Past Medical History:  Diagnosis Date  . Acute bronchitis   . Arthritis   . Atrial fibrillation ([place])    Dr. Harl Bowie- [facility] follows saw 11'14  .  Bladder stones    tx. with oral meds and antibiotics, now surgery planned  . Cataracts, both eyes    surgery planned May 2015  . Diabetes mellitus without complication ([facility])    Type II  . Dyspnea    with activity  . Dysrhythmia    Afib  . Family history of breast cancer   . Family history of colon cancer   . GERD (gastroesophageal reflux disease)   . 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 Margot Chimes, thus far without recurrence.   . Hypertension   . NHL (non-Hodgkin's lymphoma) ([facility]) 2005   Diffuse large B-cell lymphoma, clinically stage IIIA, CD20 positive, status post cervical lymph node biopsy 08/03/2003 on the left. PET scan was also positive in the spleen and small bowel region, but bone marrow aspiration and biopsy were negative. So he essentially had stage IIIAs. He received R-CHOP x6 cycles with CR established by PET scan criteria on 11/23/2003 with no evidence for relapse th  . PAD (peripheral artery disease) ([facility]) 08/27/2017  . Skin cancer    Basal cell- face,head, neck,  arms, legs, Back   Past Surgical History:  Procedure Laterality Date  . AMPUTATION Left 06/29/2017   Procedure: AMPUTATION LEFT GREAT TOE;  Surgeon: [name], MD;  Location: [facility];  Service: Vascular;  Laterality: Left;  . AMPUTATION Left 08/27/2017   Procedure: AMPUTATION Left SECOND TOE;  Surgeon: [name], MD;  Location: [facility];  Service: Vascular;  Laterality: Left;  . CATARACT EXTRACTION Bilateral 2015  . CHOLECYSTECTOMY    . COLON RESECTION     small bowel  . CYSTOSCOPY WITH LITHOLAPAXY N/A 08/10/2013   Procedure: CYSTOSCOPY WITH LITHOLAPAXY WITH [name];  Surgeon: [name], MD;  [facility];  Service: Urology;  Laterality: N/A;  .  ESOPHAGEAL DILATION N/A 02/27/2016   Procedure: ESOPHAGEAL DILATION;  Surgeon: [name], MD;  [facility];  Service: Endoscopy;  Laterality: N/A;  . ESOPHAGEAL DILATION N/A 07/07/2018   Procedure: ESOPHAGEAL DILATION;  Surgeon: [name], MD;  [facility];  Service: Endoscopy;  Laterality: N/A;  . ESOPHAGOGASTRODUODENOSCOPY N/A 02/27/2016   Procedure: ESOPHAGOGASTRODUODENOSCOPY (EGD);  Surgeon: [name], MD;  [facility];  Service: Endoscopy;  Laterality: N/A;  12:00  . ESOPHAGOGASTRODUODENOSCOPY N/A 07/07/2018   Procedure: ESOPHAGOGASTRODUODENOSCOPY (EGD);  Surgeon: [name], MD;  [facility];  Service: Endoscopy;  Laterality: N/A;  . INCISION AND DRAINAGE PERIRECTAL ABSCESS    . LOWER EXTREMITY ANGIOGRAPHY N/A 06/25/2017   Procedure: LOWER EXTREMITY ANGIOGRAPHY;  Surgeon: [name], MD;  Location: [facility];  Service: Cardiovascular;  Laterality: N/A;  . LYMPH NODE DISSECTION Left    '05-neck  . PORT-A-CATH REMOVAL    . PORTACATH PLACEMENT     insertion and removal -last chemotherapy 10 yrs ago  . TRANSURETHRAL RESECTION OF PROSTATE N/A 08/10/2013   Procedure: TRANSURETHRAL RESECTION OF THE PROSTATE WITH GYRUS INSTRUMENTS;  Surgeon: [name], MD;  [facility];  Service: Urology;  Laterality: N/A;     SOCIAL HISTORY:  Social History   Socioeconomic History  . Marital status: Married    Spouse name: Not on file  . Number of children: Not on file  .  Years of education: Not on file  . Highest education level: Not on file  Occupational History  . Occupation: retired    Fish farm manager: RETIRED    Comment: salesman  Social Needs  . Financial resource strain: Not on file  . Food insecurity    Worry: Not on file    Inability: Not on file  . Transportation needs    Medical: Not on file    Non-medical: Not on file  Tobacco Use  . Smoking status: Never Smoker  . Smokeless tobacco: Never  Used  Substance and Sexual Activity  . Alcohol use: No  . Drug use: No  . Sexual activity: Not Currently  Lifestyle  . Physical activity    Days per week: Not on file    Minutes per session: Not on file  . Stress: Not on file  Relationships  . Social Herbalist on phone: Not on file    Gets together: Not on file    Attends religious service: Not on file    Active member of club or organization: Not on file    Attends meetings of clubs or organizations: Not on file    Relationship status: Not on file  . Intimate partner violence    Fear of current or ex partner: Not on file    Emotionally abused: Not on file    Physically abused: Not on file    Forced sexual activity: Not on file  Other Topics Concern  . Not on file  Social History Narrative  . Not on file    FAMILY HISTORY:  Family History  Problem Relation Age of Onset  . Heart attack Mother        died in her 53s  . Other Father 42       pneumonia - died  . Breast cancer Sister        dx in her 83s-60s  . Colon cancer Brother        dx in his 63s  . Cancer Sister        TAH/BSO due to "male cancer"  . Breast cancer Daughter 84  . Breast cancer Daughter 49       DCIS - ATM VUS on [facility] 83 gene panel in 2018  . Breast cancer Daughter 15       DCIS - Negative on [facility] MyRisk 25 gene apnel in 2015  . Thyroid cancer Daughter 53       Medullary and Papillary  . Thyroid nodules Grandchild     CURRENT MEDICATIONS:  Outpatient Encounter Medications as of 02/02/2019  Medication Sig  . apixaban (ELIQUIS) 5 MG TABS tablet Take 5 mg by mouth 2 (two) times daily.  . Calcium Polycarbophil (FIBER-CAPS PO) Take 1 capsule by mouth 2 (two) times daily.  . Cholecalciferol (VITAMIN D3) 1000 units CAPS Take 1,000 Units by mouth every morning.   . Cranberry-Vitamin C-Vitamin E (CRANBERRY PLUS VITAMIN C) 4200-20-3 MG-MG-UNIT CAPS Take 1 capsule by mouth every morning.  . Cyanocobalamin (VITAMIN B-12 IJ) Inject  1,000 mcg as directed every 30 (thirty) days.   Marland Kitchen denosumab (XGEVA) 120 MG/1.7ML SOLN injection Inject 120 mg into the skin every 30 (thirty) days.   Marland Kitchen diltiazem (CARDIZEM) 60 MG tablet Take 1 tablet (60 mg total) by mouth 2 (two) times daily.  . diphenhydrAMINE-APAP, sleep, (TYLENOL PM EXTRA STRENGTH PO) Take 1 tablet by mouth at bedtime.   . donepezil (ARICEPT) 10 MG tablet Take 10 mg by mouth  daily.  . feeding supplement, ENSURE ENLIVE, (ENSURE ENLIVE) LIQD Take 237 mLs by mouth daily. CHOCOLATE   . furosemide (LASIX) 20 MG tablet Take 1 tablet (20 mg total) by mouth daily.  Marland Kitchen glipiZIDE (GLUCOTROL XL) 5 MG 24 hr tablet Take 5 mg by mouth daily with breakfast.   . Leuprolide Acetate, 6 Month, (LUPRON DEPOT) 45 MG injection Inject 45 mg into the muscle every 6 (six) months.  Marland Kitchen lisinopril (PRINIVIL,ZESTRIL) 20 MG tablet Take 1 tablet (20 mg total) by mouth daily.  . metFORMIN (GLUCOPHAGE) 850 MG tablet Take 850 mg by mouth 2 (two) times daily with a meal.   . mirtazapine (REMERON) 7.5 MG tablet Take 7.5 mg by mouth at bedtime.  . Multiple Vitamin (MULTIVITAMIN WITH MINERALS) TABS tablet Take 1 tablet by mouth every morning.  Marland Kitchen omeprazole (PRILOSEC) 40 MG capsule Take 40 mg by mouth every other day.   . potassium chloride (K-DUR,KLOR-CON) 10 MEQ tablet Take 2 tablets (20 mEq total) by mouth daily. (Patient taking differently: Take 10 mEq by mouth 2 (two) times daily. )  . predniSONE (DELTASONE) 5 MG tablet Take 1/2 (one-half) tablet by mouth twice daily  . trimethoprim (TRIMPEX) 100 MG tablet Take 100 mg by mouth at bedtime.  Marland Kitchen abiraterone acetate (ZYTIGA) 250 MG tablet TAKE 2 TABLETS BY MOUTH ONCE DAILY AS DIRECTED.  TAKE 1 HOUR BEFORE OR 2 HOURS AFTER A MEAL (Patient not taking: Reported on 02/02/2019)  . acetaminophen (TYLENOL) 500 MG tablet Take 500-1,000 mg by mouth every 8 (eight) hours as needed for mild pain, fever or headache.   . albuterol (PROVENTIL HFA;VENTOLIN HFA) 108 (90 Base)  MCG/ACT inhaler Inhale 2 puffs into the lungs every 6 (six) hours as needed for wheezing or shortness of breath. (Patient not taking: Reported on 02/02/2019)  . fluticasone (FLONASE) 50 MCG/ACT nasal spray Place 2 sprays into both nostrils daily.  Marland Kitchen guaiFENesin (MUCINEX) 600 MG 12 hr tablet Take 600 mg by mouth 2 (two) times daily as needed for to loosen phlegm.  . loperamide (IMODIUM) 2 MG capsule Take 1 capsule (2 mg total) by mouth as needed for diarrhea or loose stools. (Patient not taking: Reported on 01/25/2019)  . nystatin (MYCOSTATIN) powder Apply 1 g topically 4 (four) times daily as needed (fungus).   . [DISCONTINUED] levofloxacin (LEVAQUIN) 500 MG tablet Take 1 tablet by mouth once daily   No facility-administered encounter medications on file as of 02/02/2019.     ALLERGIES:  Allergies  Allergen Reactions  . Tape Other (See Comments)    SKIN IS VERY THIN AND WILL TEAR AND BRUISE VERY EASILY!!!!  . Augmentin [Amoxicillin-Pot Clavulanate] Rash and Other (See Comments)    Redness of skin     PHYSICAL EXAM:  ECOG Performance status: 2  Vitals:   02/02/19 1438  BP: 112/71  Pulse: 78  Resp: 16  Temp: (!) 97.5 F (36.4 C)  SpO2: 100%   Filed Weights   02/02/19 1438  Weight: 148 lb (67.1 kg)    Physical Exam Constitutional:      Appearance: Normal appearance. He is normal weight.  Cardiovascular:     Rate and Rhythm: Normal rate and regular rhythm.     Heart sounds: Normal heart sounds.  Pulmonary:     Effort: Pulmonary effort is normal.     Breath sounds: Normal breath sounds.  Abdominal:     General: Bowel sounds are normal.     Palpations: Abdomen is soft.  Musculoskeletal: Normal range  of motion.  Skin:    General: Skin is warm.  Neurological:     Mental Status: He is alert and oriented to person, place, and time. Mental status is at baseline.  Psychiatric:        Mood and Affect: Mood normal.        Behavior: Behavior normal.        Thought Content:  Thought content normal.        Judgment: Judgment normal.      LABORATORY DATA:  I have reviewed the labs as listed.  CBC    Component Value Date/Time   WBC 8.8 02/02/2019 1324   RBC 3.64 (L) 02/02/2019 1324   HGB 11.7 (L) 02/02/2019 1324   HCT 34.9 (L) 02/02/2019 1324   PLT 159 02/02/2019 1324   MCV 95.9 02/02/2019 1324   MCH 32.1 02/02/2019 1324   MCHC 33.5 02/02/2019 1324   RDW 13.2 02/02/2019 1324   LYMPHSABS 2.6 02/02/2019 1324   MONOABS 1.1 (H) 02/02/2019 1324   EOSABS 0.0 02/02/2019 1324   BASOSABS 0.0 02/02/2019 1324   CMP Latest Ref Rng & Units 02/02/2019 01/25/2019 01/06/2019  Glucose 70 - 99 mg/dL 178(H) 177(H) 122(H)  BUN 8 - 23 mg/dL 28(H) 23 20  Creatinine 0.61 - 1.24 mg/dL 1.06 0.94 0.84  Sodium 135 - 145 mmol/L 140 138 137  Potassium 3.5 - 5.1 mmol/L 4.5 4.5 4.1  Chloride 98 - 111 mmol/L 113(H) 105 104  CO2 22 - 32 mmol/L 18(L) 22 28  Calcium 8.9 - 10.3 mg/dL 9.7 10.6(H) 10.6(H)  Total Protein 6.5 - 8.1 g/dL 6.4(L) 6.7 6.3(L)  Total Bilirubin 0.3 - 1.2 mg/dL 0.4 0.6 0.7  Alkaline Phos 38 - 126 U/L 51 62 59  AST 15 - 41 U/L 18 16 16   ALT 0 - 44 U/L 21 13 14     I personally performed a face-to-face visit.  All questions were answered to patient's stated satisfaction. Encouraged patient to call with any new concerns or questions before his next visit to the cancer center and we can certain see him sooner, if needed.     ASSESSMENT & PLAN:   Prostate cancer ([place]) 1.  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 -Last Lupron injection 45 mg on 08/26/2018. -Last bone scan on 09/01/2017 showed interval improvement in T12 lesion.  No new lesions. -Abiraterone dose reduced to 500 mg once a day and prednisone 2.5 mg twice a day on 02/10/2018. -He is tolerating his abiraterone very well.  His PSA level continues to be undetectable. -He had a cough with yellow sputum 2 weeks ago.  He  was seen by Dr. Devin Going and was given a Z-Pak. -He came to our office about a week ago and is still having the same cough with a greenish sputum.  We did a chest x-ray and placed him on Levaquin for 7 days.  We also stopped his abiraterone at that time. -He is here today and doing much better.  We will start his abiraterone on back at the same dose. -He will follow up in 2 months with labs.  2.  Bone strengthening: -Labs on 02/02/2019 showed calcium level 9.7. -He is receiving denosumab monthly  3.  IgM MGUS: -SPEP done on 01/25/2019 showed M spike is stable at 0.4 g/dL light chain ratio of 0.91.  4.  DLBCL: -Diagnosed in 2005, he does not have any B symptoms or lymphadenopathy at  this time.      Orders placed this encounter:  Orders Placed This Encounter  Procedures  . Lactate dehydrogenase  . CBC with Differential/Platelet  . Comprehensive metabolic panel  . PSA      Francene Finders, FNP-C [place] [phone]

## 2019-02-03 ENCOUNTER — Telehealth (HOSPITAL_COMMUNITY): Payer: Self-pay

## 2019-02-03 NOTE — Telephone Encounter (Signed)
Nutrition Assessment   Reason for Assessment:   Patient identified on Malnutrition Screening report for weight loss and poor appetite.    ASSESSMENT:  85 year old male with prostate cancer stage IV currently on zytiga (restarted yesterday), and receiving denosumab monthly.  Past medical history of a fib, DM, GERD, GIST, HTN, NHL, PAD.    Called patient to introduce self and service at [facility].  Spoke with daughter Rollene Fare as patient unable to hear very well.  Could hear patient and wife in background.  Reports that appetite has been decreased due to 2 bouts of pneumonia.  Wife reports that patient eats 1 egg with toast and apple butter for breakfast and sausage link.  Lunch is usually a sandwich (peanut butter, ham, deviled ham) and supper is the same.  Daughter reports that patient has trouble chewing.  Does not eat cheese.  Likes cheesecake and candy.  Does not drink much fluids so wife encourages him to eat SF jello.  Drinks diet drinks.  Wife and patient live alone and has caregiver help out 5 days per week.  Drinks 1-2 ensure plus daily.     Daughter reports decrease appetite and weight over the last 2 years.   Nutrition Focused Physical Exam: deferred   Medications: Vit D, Vit B 12, lasix, glipizide, metformin, remeron, mVI, K CL, predinsone   Labs: glucose 178, BUN 28, creatinine 1.06   Anthropometrics:   Height: 70 inches Weight: 148 lb (10/15), decreased to 145 lb on 10/7 UBW: in the 200s 2+ years ago but 150-155 lb for the last year.  Noted 155 lb March 2020 BMI: 21  5% weight loss in the last 7 months, not significant   Estimated Energy Needs  Kcals: [id number] Protein: 100-117 g Fluid: > 2 L   NUTRITION DIAGNOSIS: Inadequate oral intake related to pneumonia and cancer as evidenced by 5% weight loss, poor po intake   INTERVENTION:  Discussed strategies to increase calories and protein with daughter.  Will mail handout Encouraged patient to continue ensure plus  BID. Will mail coupons Will mail contact information as well and daughter will reach out if have further questions or concerns.      MONITORING, EVALUATION, GOAL: Patient will consume adequate calories and protein to maintain weight   Next Visit: daughter to contact RD as needed  Prakriti Carignan B. Zenia Resides, [place], [place] Registered Dietitian [phone] (pager)

## 2019-02-06 DIAGNOSIS — I4821 Permanent atrial fibrillation: Secondary | ICD-10-CM | POA: Diagnosis not present

## 2019-02-06 DIAGNOSIS — C8581 Other specified types of non-Hodgkin lymphoma, lymph nodes of head, face, and neck: Secondary | ICD-10-CM | POA: Diagnosis not present

## 2019-02-06 DIAGNOSIS — R269 Unspecified abnormalities of gait and mobility: Secondary | ICD-10-CM | POA: Diagnosis not present

## 2019-02-06 DIAGNOSIS — E43 Unspecified severe protein-calorie malnutrition: Secondary | ICD-10-CM | POA: Diagnosis not present

## 2019-02-06 DIAGNOSIS — I10 Essential (primary) hypertension: Secondary | ICD-10-CM | POA: Diagnosis not present

## 2019-02-06 DIAGNOSIS — C61 Malignant neoplasm of prostate: Secondary | ICD-10-CM | POA: Diagnosis not present

## 2019-02-07 DIAGNOSIS — H43393 Other vitreous opacities, bilateral: Secondary | ICD-10-CM | POA: Diagnosis not present

## 2019-02-07 DIAGNOSIS — Z961 Presence of intraocular lens: Secondary | ICD-10-CM | POA: Diagnosis not present

## 2019-02-07 DIAGNOSIS — H35371 Puckering of macula, right eye: Secondary | ICD-10-CM | POA: Diagnosis not present

## 2019-02-07 DIAGNOSIS — H35341 Macular cyst, hole, or pseudohole, right eye: Secondary | ICD-10-CM | POA: Diagnosis not present

## 2019-02-10 ENCOUNTER — Telehealth: Payer: Self-pay | Admitting: *Deleted

## 2019-02-10 NOTE — Telephone Encounter (Signed)
Called pt to notify that pt assistance Eliquis has arrived in office. NA, LMTCB.

## 2019-02-13 ENCOUNTER — Ambulatory Visit: Payer: Medicare Other | Admitting: Family Medicine

## 2019-02-13 DIAGNOSIS — C44629 Squamous cell carcinoma of skin of left upper limb, including shoulder: Secondary | ICD-10-CM | POA: Diagnosis not present

## 2019-02-13 DIAGNOSIS — L570 Actinic keratosis: Secondary | ICD-10-CM | POA: Diagnosis not present

## 2019-02-13 DIAGNOSIS — X32XXXD Exposure to sunlight, subsequent encounter: Secondary | ICD-10-CM | POA: Diagnosis not present

## 2019-02-15 DIAGNOSIS — C8581 Other specified types of non-Hodgkin lymphoma, lymph nodes of head, face, and neck: Secondary | ICD-10-CM | POA: Diagnosis not present

## 2019-02-15 DIAGNOSIS — C61 Malignant neoplasm of prostate: Secondary | ICD-10-CM | POA: Diagnosis not present

## 2019-02-15 DIAGNOSIS — E43 Unspecified severe protein-calorie malnutrition: Secondary | ICD-10-CM | POA: Diagnosis not present

## 2019-02-15 DIAGNOSIS — I4821 Permanent atrial fibrillation: Secondary | ICD-10-CM | POA: Diagnosis not present

## 2019-02-15 DIAGNOSIS — R269 Unspecified abnormalities of gait and mobility: Secondary | ICD-10-CM | POA: Diagnosis not present

## 2019-02-15 DIAGNOSIS — I10 Essential (primary) hypertension: Secondary | ICD-10-CM | POA: Diagnosis not present

## 2019-02-17 DIAGNOSIS — I4821 Permanent atrial fibrillation: Secondary | ICD-10-CM | POA: Diagnosis not present

## 2019-02-17 DIAGNOSIS — C61 Malignant neoplasm of prostate: Secondary | ICD-10-CM | POA: Diagnosis not present

## 2019-02-17 DIAGNOSIS — Z7984 Long term (current) use of oral hypoglycemic drugs: Secondary | ICD-10-CM | POA: Diagnosis not present

## 2019-02-17 DIAGNOSIS — E43 Unspecified severe protein-calorie malnutrition: Secondary | ICD-10-CM | POA: Diagnosis not present

## 2019-02-17 DIAGNOSIS — I10 Essential (primary) hypertension: Secondary | ICD-10-CM | POA: Diagnosis not present

## 2019-02-17 DIAGNOSIS — M6281 Muscle weakness (generalized): Secondary | ICD-10-CM | POA: Diagnosis not present

## 2019-02-17 DIAGNOSIS — C8581 Other specified types of non-Hodgkin lymphoma, lymph nodes of head, face, and neck: Secondary | ICD-10-CM | POA: Diagnosis not present

## 2019-02-17 DIAGNOSIS — R269 Unspecified abnormalities of gait and mobility: Secondary | ICD-10-CM | POA: Diagnosis not present

## 2019-02-17 DIAGNOSIS — E1165 Type 2 diabetes mellitus with hyperglycemia: Secondary | ICD-10-CM | POA: Diagnosis not present

## 2019-02-20 ENCOUNTER — Ambulatory Visit: Payer: Medicare Other | Admitting: Family Medicine

## 2019-02-21 ENCOUNTER — Ambulatory Visit (INDEPENDENT_AMBULATORY_CARE_PROVIDER_SITE_OTHER): Payer: Medicare Other | Admitting: Urology

## 2019-02-21 DIAGNOSIS — C61 Malignant neoplasm of prostate: Secondary | ICD-10-CM

## 2019-02-21 DIAGNOSIS — C8581 Other specified types of non-Hodgkin lymphoma, lymph nodes of head, face, and neck: Secondary | ICD-10-CM | POA: Diagnosis not present

## 2019-02-21 DIAGNOSIS — E43 Unspecified severe protein-calorie malnutrition: Secondary | ICD-10-CM | POA: Diagnosis not present

## 2019-02-21 DIAGNOSIS — I10 Essential (primary) hypertension: Secondary | ICD-10-CM | POA: Diagnosis not present

## 2019-02-21 DIAGNOSIS — I4821 Permanent atrial fibrillation: Secondary | ICD-10-CM | POA: Diagnosis not present

## 2019-02-21 DIAGNOSIS — N3941 Urge incontinence: Secondary | ICD-10-CM

## 2019-02-21 DIAGNOSIS — N3020 Other chronic cystitis without hematuria: Secondary | ICD-10-CM | POA: Diagnosis not present

## 2019-02-21 DIAGNOSIS — R269 Unspecified abnormalities of gait and mobility: Secondary | ICD-10-CM | POA: Diagnosis not present

## 2019-02-22 ENCOUNTER — Ambulatory Visit (INDEPENDENT_AMBULATORY_CARE_PROVIDER_SITE_OTHER): Payer: Medicare Other | Admitting: Family Medicine

## 2019-02-22 ENCOUNTER — Other Ambulatory Visit: Payer: Self-pay

## 2019-02-22 VITALS — BP 130/79 | HR 70 | Temp 98.2°F | Ht 70.0 in | Wt 148.8 lb

## 2019-02-22 DIAGNOSIS — I739 Peripheral vascular disease, unspecified: Secondary | ICD-10-CM | POA: Diagnosis not present

## 2019-02-22 DIAGNOSIS — Z7901 Long term (current) use of anticoagulants: Secondary | ICD-10-CM | POA: Diagnosis not present

## 2019-02-22 DIAGNOSIS — I10 Essential (primary) hypertension: Secondary | ICD-10-CM | POA: Diagnosis not present

## 2019-02-22 DIAGNOSIS — R413 Other amnesia: Secondary | ICD-10-CM | POA: Diagnosis not present

## 2019-02-22 DIAGNOSIS — E1140 Type 2 diabetes mellitus with diabetic neuropathy, unspecified: Secondary | ICD-10-CM | POA: Diagnosis not present

## 2019-02-22 DIAGNOSIS — I4891 Unspecified atrial fibrillation: Secondary | ICD-10-CM

## 2019-02-22 DIAGNOSIS — E1040 Type 1 diabetes mellitus with diabetic neuropathy, unspecified: Secondary | ICD-10-CM | POA: Insufficient documentation

## 2019-02-22 DIAGNOSIS — C61 Malignant neoplasm of prostate: Secondary | ICD-10-CM

## 2019-02-22 NOTE — Progress Notes (Signed)
New Patient Office Visit  Subjective:  Patient ID: [name], male    DOB: 1923-10-25  Age: 85 y.o. MRN: [id number]  CC:  Chief Complaint  Patient presents with  . Establish Care  DM-metformin and glucotrol daily-does not check glucose readings Sees urology-recurrent UTI Sees cardio-afib-eliquis Sees oncology-B cell lymphoma  HPI [name] presents for DM-takes oral meds, no insulin-sees [facility] B cell lymphoma [facility]-PT for transfers-sits in chair with little ambulation-concern for skin breakdown by wife/daughter-no current nursing evaluation by [facility] 10/19 falls, skin tears, ulcer previous Recurrent UTI-trimethoprim-urology continues daily, macrobid in addition currently-prostate cancer-xgeva. lupron HTN-cardizem, lasix/Potassium Dementia-aricept, remeron GERD-prilosec Nutrition-uses ensure for meals at times    Past Medical History:  Diagnosis Date  . Acute bronchitis   . Arthritis   . Atrial fibrillation ([place])    Dr. Harl Bowie- [facility] follows saw 11'14  . Bladder stones    tx. with oral meds and antibiotics, now surgery planned  . Cataracts, both eyes    surgery planned May 2015  . Diabetes mellitus without complication ([place])    Type II  . Dyspnea    with activity  . Dysrhythmia    Afib  . Family history of breast cancer   . Family history of colon cancer   . GERD (gastroesophageal reflux disease)   . 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 Margot Chimes, thus far without recurrence.   . Hypertension   . NHL (non-Hodgkin's lymphoma) ([place]) 2005   Diffuse large B-cell lymphoma, clinically stage IIIA, CD20 positive, status post cervical lymph node biopsy 08/03/2003 on the left. PET scan was also positive in the  spleen and small bowel region, but bone marrow aspiration and biopsy were negative. So he essentially had stage IIIAs. He received R-CHOP x6 cycles with CR established by PET scan criteria on 11/23/2003 with no evidence for relapse th  . PAD (peripheral artery disease) ([place]) 08/27/2017  . Skin cancer    Basal cell- face,head, neck,  arms, legs, Back    Past Surgical History:  Procedure Laterality Date  . AMPUTATION Left 06/29/2017   Procedure: AMPUTATION LEFT GREAT TOE;  Surgeon: [name], MD;  Location: [facility];  Service: Vascular;  Laterality: Left;  . AMPUTATION Left 08/27/2017   Procedure: AMPUTATION Left SECOND TOE;  Surgeon: [name], MD;  Location: [facility];  Service: Vascular;  Laterality: Left;  . CATARACT EXTRACTION Bilateral 2015  . CHOLECYSTECTOMY    . COLON RESECTION     small bowel  . CYSTOSCOPY WITH LITHOLAPAXY N/A 08/10/2013   Procedure: CYSTOSCOPY WITH LITHOLAPAXY WITH [name];  Surgeon: [name], MD;  [facility];  Service: Urology;  Laterality: N/A;  . ESOPHAGEAL DILATION N/A 02/27/2016   Procedure: ESOPHAGEAL DILATION;  Surgeon: [name], MD;  [facility];  Service: Endoscopy;  Laterality: N/A;  . ESOPHAGEAL DILATION N/A 07/07/2018   Procedure: ESOPHAGEAL DILATION;  Surgeon: [name], MD;  [facility];  Service: Endoscopy;  Laterality: N/A;  . ESOPHAGOGASTRODUODENOSCOPY N/A 02/27/2016   Procedure: ESOPHAGOGASTRODUODENOSCOPY (EGD);  Surgeon: [name], MD;  [facility];  Service: Endoscopy;  Laterality: N/A;  12:00  . ESOPHAGOGASTRODUODENOSCOPY N/A 07/07/2018   Procedure: ESOPHAGOGASTRODUODENOSCOPY (EGD);  Surgeon: [name],  MD;  [facility];  Service: Endoscopy;  Laterality: N/A;  . INCISION AND DRAINAGE PERIRECTAL ABSCESS    . LOWER EXTREMITY ANGIOGRAPHY N/A 06/25/2017   Procedure: LOWER EXTREMITY ANGIOGRAPHY;  Surgeon: [name], MD;  Location: [facility];  Service: Cardiovascular;  Laterality: N/A;  . LYMPH NODE DISSECTION Left    '05-neck  . PORT-A-CATH REMOVAL    . PORTACATH PLACEMENT     insertion and removal -last chemotherapy 10 yrs ago  . TRANSURETHRAL RESECTION OF PROSTATE N/A 08/10/2013   Procedure: TRANSURETHRAL RESECTION OF THE PROSTATE WITH GYRUS INSTRUMENTS;  Surgeon: [name], MD;  [facility];  Service: Urology;  Laterality: N/A;    Family History  Problem Relation Age of Onset  . Heart attack Mother        died in her 53s  . Other Father 38       pneumonia - died  . Breast cancer Sister        dx in her 27s-60s  . Colon cancer Brother        dx in his 11s  . Cancer Sister        TAH/BSO due to "male cancer"  . Breast cancer Daughter 90  . Breast cancer Daughter 27       DCIS - ATM VUS on [employer]itae 83 gene panel in 2018  . Breast cancer Daughter 51       DCIS - Negative on Myriad MyRisk 25 gene apnel in 2015  . Thyroid cancer Daughter 2       Medullary and Papillary  . Thyroid nodules Grandchild     Social History   Socioeconomic History  . Marital status: Married    Spouse name: Not on file  . Number of children: Not on file  . Years of education: Not on file  . Highest education level: Not on file  Occupational History  . Occupation: retired    Fish farm manager: RETIRED    Comment: salesman  Social Needs  . Financial resource strain: Not on file  . Food insecurity    Worry: Not on file    Inability: Not on file  . Transportation needs    Medical: Not on file    Non-medical: Not on file  Tobacco Use  . Smoking status: Never Smoker  . Smokeless tobacco: Never Used  Substance and Sexual Activity  . Alcohol use: No  . Drug use: No  . Sexual activity: Not Currently  Lifestyle  . Physical activity    Days per week: Not on file    Minutes per session: Not on file  . Stress: Not on file  Relationships  . Social Herbalist on phone: Not on file    Gets together: Not on file     Attends religious service: Not on file    Active member of club or organization: Not on file    Attends meetings of clubs or organizations: Not on file    Relationship status: Not on file  . Intimate partner violence    Fear of current or ex partner: Not on file    Emotionally abused: Not on file    Physically abused: Not on file    Forced sexual activity: Not on file  Other Topics Concern  . Not on file  Social History Narrative  . Not on file    ROS Review of Systems  Constitutional: Positive for activity change and appetite change.  Less ambulation-PT assisting for strength  Respiratory: Negative.   Cardiovascular: Negative.   Gastrointestinal: Positive for constipation.  Genitourinary:       Incontinence  Skin:       Skin breakdown on buttocks  Allergic/Immunologic: Negative.   Neurological: Negative.   Hematological: Negative.   Psychiatric/Behavioral: Positive for confusion and sleep disturbance.    Objective:   Today's Vitals: BP 130/79 (BP Location: Right Arm, Patient Position: Sitting, Cuff Size: Normal)   Pulse 70   Temp 98.2 F (36.8 C) (Oral)   Ht _0  (1.778 m)   Wt 148 lb 12.8 oz (67.5 kg)   SpO2 98%   BMI 21.35 kg/m   Physical Exam Vitals signs reviewed.  Constitutional:      Appearance: Normal appearance. He is normal weight.  HENT:     Head: Normocephalic and atraumatic.     Right Ear: Tympanic membrane and ear canal normal.     Left Ear: Tympanic membrane and ear canal normal.     Nose: Nose normal.  Eyes:     Conjunctiva/sclera: Conjunctivae normal.  Neck:     Musculoskeletal: Normal range of motion and neck supple.  Cardiovascular:     Rate and Rhythm: Normal rate and regular rhythm.     Pulses: Normal pulses.     Heart sounds: Normal heart sounds.  Pulmonary:     Effort: Pulmonary effort is normal.     Breath sounds: Normal breath sounds.  Musculoskeletal: Normal range of motion.  Neurological:     Mental Status: He is  alert and oriented to person, place, and time.  Psychiatric:        Mood and Affect: Mood normal.        Behavior: Behavior normal.     Assessment & Plan:  1. Type 2 diabetes mellitus with diabetic neuropathy, without long-term current use of insulin (HCC) Continue oral medication - Hemoglobin A1c Skin care breakdown-sacral eryth-stage 1 bilat with beginning stage 2riight buttocks 2. Essential hypertension Lasix/K/Cardizem  3. Prostate cancer [facility]) Urology following  4. Long term (current) use of anticoagulants Cardio following 5. PAD (peripheral artery disease) ([place])  6. Atrial fibrillation, unspecified type ([place]) eliquis 7. Memory loss, short term D/w pt and daughter referral to neurology for assessment-family with concern dementia-night time symptoms worse with increase in care demanded -wife in home with pt-daughters assist. Family resistant in placement assisted living - Ambulatory referral to Neurology Outpatient Encounter Medications as of 02/22/2019  Medication Sig  . Potassium Chloride (KLOR-CON 10 PO) Take 10 mg by mouth 2 (two) times daily.  Marland Kitchen acetaminophen (TYLENOL) 500 MG tablet Take 500-1,000 mg by mouth every 8 (eight) hours as needed for mild pain, fever or headache.   Marland Kitchen apixaban (ELIQUIS) 5 MG TABS tablet Take 5 mg by mouth 2 (two) times daily.  . Calcium Polycarbophil (FIBER-CAPS PO) Take 1 capsule by mouth 2 (two) times daily.  . Cholecalciferol (VITAMIN D3) 1000 units CAPS Take 1,000 Units by mouth every morning.   . Cranberry-Vitamin C-Vitamin E (CRANBERRY PLUS VITAMIN C) 4200-20-3 MG-MG-UNIT CAPS Take 1 capsule by mouth every morning.  . Cyanocobalamin (VITAMIN B-12 IJ) Inject 1,000 mcg as directed every 30 (thirty) days.   Marland Kitchen denosumab (XGEVA) 120 MG/1.7ML SOLN injection Inject 120 mg into the skin every 30 (thirty) days.   Marland Kitchen diltiazem (CARDIZEM) 60 MG tablet Take 1 tablet (60 mg total) by mouth 2 (two) times daily.  . diphenhydrAMINE-APAP, sleep, (TYLENOL  PM EXTRA STRENGTH PO) Take 1  tablet by mouth at bedtime.   . donepezil (ARICEPT) 10 MG tablet Take 10 mg by mouth daily.  . feeding supplement, ENSURE ENLIVE, (ENSURE ENLIVE) LIQD Take 237 mLs by mouth daily. CHOCOLATE   . fluticasone (FLONASE) 50 MCG/ACT nasal spray Place 2 sprays into both nostrils daily.  . furosemide (LASIX) 20 MG tablet Take 1 tablet (20 mg total) by mouth daily.  Marland Kitchen glipiZIDE (GLUCOTROL XL) 5 MG 24 hr tablet Take 5 mg by mouth daily with breakfast.   . Leuprolide Acetate, 6 Month, (LUPRON DEPOT) 45 MG injection Inject 45 mg into the muscle every 6 (six) months.  Marland Kitchen lisinopril (PRINIVIL,ZESTRIL) 20 MG tablet Take 1 tablet (20 mg total) by mouth daily.  Marland Kitchen loperamide (IMODIUM) 2 MG capsule Take 1 capsule (2 mg total) by mouth as needed for diarrhea or loose stools. (Patient not taking: Reported on 01/25/2019)  . metFORMIN (GLUCOPHAGE) 850 MG tablet Take 850 mg by mouth 2 (two) times daily with a meal.   . mirtazapine (REMERON) 7.5 MG tablet Take 7.5 mg by mouth at bedtime.  . Multiple Vitamin (MULTIVITAMIN WITH MINERALS) TABS tablet Take 1 tablet by mouth every morning.  . nystatin (MYCOSTATIN) powder Apply 1 g topically 4 (four) times daily as needed (fungus).   Marland Kitchen omeprazole (PRILOSEC) 40 MG capsule Take 40 mg by mouth every other day.   . potassium chloride (K-DUR,KLOR-CON) 10 MEQ tablet Take 2 tablets (20 mEq total) by mouth daily. (Patient taking differently: Take 10 mEq by mouth 2 (two) times daily. )  . predniSONE (DELTASONE) 5 MG tablet Take 1/2 (one-half) tablet by mouth twice daily  . trimethoprim (TRIMPEX) 100 MG tablet Take 100 mg by mouth at bedtime.  . [DISCONTINUED] abiraterone acetate (ZYTIGA) 250 MG tablet TAKE 2 TABLETS BY MOUTH ONCE DAILY AS DIRECTED.  TAKE 1 HOUR BEFORE OR 2 HOURS AFTER A MEAL (Patient not taking: Reported on 02/02/2019)  . [DISCONTINUED] albuterol (PROVENTIL HFA;VENTOLIN HFA) 108 (90 Base) MCG/ACT inhaler Inhale 2 puffs into the lungs every 6  (six) hours as needed for wheezing or shortness of breath. (Patient not taking: Reported on 02/02/2019)  . [DISCONTINUED] guaiFENesin (MUCINEX) 600 MG 12 hr tablet Take 600 mg by mouth 2 (two) times daily as needed for to loosen phlegm.   No facility-administered encounter medications on file as of 02/22/2019.     Follow-up:  Sevag Shearn Hannah Beat, MD

## 2019-02-22 NOTE — Patient Instructions (Addendum)
Take metformin with food Add A1c to labwork for cancer center Add wound care nurse to home health for skin breakdown buttocks

## 2019-02-23 ENCOUNTER — Other Ambulatory Visit (HOSPITAL_COMMUNITY): Payer: Medicare Other

## 2019-02-23 ENCOUNTER — Telehealth: Payer: Self-pay | Admitting: Family Medicine

## 2019-02-23 ENCOUNTER — Ambulatory Visit (HOSPITAL_COMMUNITY): Payer: Medicare Other

## 2019-02-23 NOTE — Telephone Encounter (Signed)
Routing to Dr. Corum 

## 2019-02-23 NOTE — Telephone Encounter (Signed)
Dr Holly Bodily spoke with Benjamine Mola at [facility]

## 2019-02-23 NOTE — Telephone Encounter (Signed)
That sounds like a great idea. Have [facility] to call me and we can discuss services

## 2019-02-23 NOTE — Telephone Encounter (Signed)
Patient daughter [name] is calling and states when Dr. Holly Bodily sets up [facility] at home to look at bed sores she would also like the wound that they believe to be healing that was lowgrade skin cancer on the patient arm to be looked at as well.

## 2019-02-24 ENCOUNTER — Other Ambulatory Visit (HOSPITAL_COMMUNITY): Payer: Self-pay

## 2019-02-24 DIAGNOSIS — C49A0 Gastrointestinal stromal tumor, unspecified site: Secondary | ICD-10-CM

## 2019-02-24 DIAGNOSIS — C61 Malignant neoplasm of prostate: Secondary | ICD-10-CM

## 2019-02-24 DIAGNOSIS — C7951 Secondary malignant neoplasm of bone: Secondary | ICD-10-CM

## 2019-02-24 DIAGNOSIS — D472 Monoclonal gammopathy: Secondary | ICD-10-CM

## 2019-02-24 DIAGNOSIS — C8590 Non-Hodgkin lymphoma, unspecified, unspecified site: Secondary | ICD-10-CM

## 2019-02-27 ENCOUNTER — Other Ambulatory Visit: Payer: Self-pay

## 2019-02-27 ENCOUNTER — Encounter (HOSPITAL_COMMUNITY): Payer: Self-pay

## 2019-02-27 ENCOUNTER — Inpatient Hospital Stay (HOSPITAL_COMMUNITY): Payer: Medicare Other

## 2019-02-27 ENCOUNTER — Telehealth: Payer: Self-pay

## 2019-02-27 ENCOUNTER — Inpatient Hospital Stay (HOSPITAL_COMMUNITY): Payer: Medicare Other | Attending: Hematology

## 2019-02-27 ENCOUNTER — Other Ambulatory Visit (HOSPITAL_COMMUNITY)
Admission: RE | Admit: 2019-02-27 | Discharge: 2019-02-27 | Disposition: A | Discharge status: Home / Self Care | Payer: Medicare Other | Source: Ambulatory Visit | Attending: Family Medicine | Admitting: Family Medicine

## 2019-02-27 VITALS — BP 119/85 | HR 70 | Temp 98.9°F | Resp 18

## 2019-02-27 DIAGNOSIS — C7951 Secondary malignant neoplasm of bone: Secondary | ICD-10-CM | POA: Insufficient documentation

## 2019-02-27 DIAGNOSIS — C49A0 Gastrointestinal stromal tumor, unspecified site: Secondary | ICD-10-CM

## 2019-02-27 DIAGNOSIS — C61 Malignant neoplasm of prostate: Secondary | ICD-10-CM | POA: Insufficient documentation

## 2019-02-27 DIAGNOSIS — D472 Monoclonal gammopathy: Secondary | ICD-10-CM | POA: Diagnosis not present

## 2019-02-27 DIAGNOSIS — E1140 Type 2 diabetes mellitus with diabetic neuropathy, unspecified: Secondary | ICD-10-CM | POA: Diagnosis not present

## 2019-02-27 DIAGNOSIS — C8590 Non-Hodgkin lymphoma, unspecified, unspecified site: Secondary | ICD-10-CM

## 2019-02-27 DIAGNOSIS — R413 Other amnesia: Secondary | ICD-10-CM

## 2019-02-27 LAB — COMPREHENSIVE METABOLIC PANEL
ALT: 13 U/L (ref 0–44)
AST: 15 U/L (ref 15–41)
Albumin: 3.9 g/dL (ref 3.5–5.0)
Alkaline Phosphatase: 65 U/L (ref 38–126)
Anion gap: 8 (ref 5–15)
BUN: 19 mg/dL (ref 8–23)
CO2: 25 mmol/L (ref 22–32)
Calcium: 10.2 mg/dL (ref 8.9–10.3)
Chloride: 106 mmol/L (ref 98–111)
Creatinine, Ser: 0.85 mg/dL (ref 0.61–1.24)
GFR calc Af Amer: >60 mL/min (ref >60)
GFR calc non Af Amer: >60 mL/min (ref >60)
Glucose, Bld: 161 mg/dL — ABNORMAL HIGH (ref 70–99)
Potassium: 4.8 mmol/L (ref 3.5–5.1)
Sodium: 139 mmol/L (ref 135–145)
Total Bilirubin: 0.7 mg/dL (ref 0.3–1.2)
Total Protein: 6.9 g/dL (ref 6.5–8.1)

## 2019-02-27 LAB — HEMOGLOBIN A1C
Hgb A1c MFr Bld: 5.6 % (ref 4.8–5.6)
Mean Plasma Glucose: 114.02 mg/dL

## 2019-02-27 MED ORDER — DONEPEZIL HCL 10 MG PO TABS: 10.0000 mg | ORAL_TABLET | Freq: Every day | ORAL | 0 refills | Status: DC

## 2019-02-27 MED ORDER — DENOSUMAB 120 MG/1.7ML ~~LOC~~ SOLN
120.0000 mg | Freq: Once | SUBCUTANEOUS | Status: AC
  Administered 2019-02-27: 120 mg via SUBCUTANEOUS
  Filled 2019-02-27: qty 1.7

## 2019-02-27 NOTE — Progress Notes (Signed)
Patient tolerated injection with no complaints voiced.  Site clean and dry with no bruising or swelling noted at site.  Band aid applied.  Vss with discharge and left ambulatory with no s/s of distress noted.  

## 2019-02-27 NOTE — Telephone Encounter (Signed)
[name], CMA  

## 2019-02-28 DIAGNOSIS — E1151 Type 2 diabetes mellitus with diabetic peripheral angiopathy without gangrene: Secondary | ICD-10-CM | POA: Diagnosis not present

## 2019-02-28 DIAGNOSIS — S90411A Abrasion, right great toe, initial encounter: Secondary | ICD-10-CM | POA: Diagnosis not present

## 2019-03-08 ENCOUNTER — Other Ambulatory Visit: Payer: Self-pay

## 2019-03-08 ENCOUNTER — Inpatient Hospital Stay (HOSPITAL_COMMUNITY): Payer: Medicare Other

## 2019-03-08 VITALS — BP 132/57 | HR 68 | Temp 97.5°F | Resp 16

## 2019-03-08 DIAGNOSIS — C7951 Secondary malignant neoplasm of bone: Secondary | ICD-10-CM | POA: Diagnosis not present

## 2019-03-08 DIAGNOSIS — C61 Malignant neoplasm of prostate: Secondary | ICD-10-CM

## 2019-03-08 DIAGNOSIS — D472 Monoclonal gammopathy: Secondary | ICD-10-CM | POA: Diagnosis not present

## 2019-03-08 MED ORDER — LEUPROLIDE ACETATE (6 MONTH) 45 MG ~~LOC~~ KIT
45.0000 mg | PACK | Freq: Once | SUBCUTANEOUS | Status: AC
  Administered 2019-03-08: 45 mg via SUBCUTANEOUS
  Filled 2019-03-08: qty 45

## 2019-03-08 NOTE — Progress Notes (Signed)
[name] presents today for Eligard injection. VSS. Injection tolerated without incident or complaint. See MAR for details. Patient discharged in satisfactory condition with follow up instructions.

## 2019-03-08 NOTE — Patient Instructions (Signed)
[place] at [facility]  Discharge Instructions:  Eligard injection received today. Follow up as instructed. _______________________________________________________________  Thank you for choosing [place] at [facility] to provide your oncology and hematology care.  To afford each patient quality time with our providers, please arrive at least 15 minutes before your scheduled appointment.  You need to re-schedule your appointment if you arrive 10 or more minutes late.  We strive to give you quality time with our providers, and arriving late affects you and other patients whose appointments are after yours.  Also, if you no show three or more times for appointments you may be dismissed from the clinic.  Again, thank you for choosing [place] at [facility] hope is that these requests will allow you access to exceptional care and in a timely manner. _______________________________________________________________  If you have questions after your visit, please contact our office at [phone] between the hours of 8:30 a.m. and 5:00 p.m. Voicemails left after 4:30 p.m. will not be returned until the following business day. _______________________________________________________________  For prescription refill requests, have your pharmacy contact our office. _______________________________________________________________  Recommendations made by the consultant and any test results will be sent to your referring physician. _______________________________________________________________

## 2019-03-10 ENCOUNTER — Other Ambulatory Visit (HOSPITAL_COMMUNITY): Payer: Self-pay | Admitting: Nurse Practitioner

## 2019-03-13 ENCOUNTER — Telehealth: Payer: Self-pay | Admitting: Family Medicine

## 2019-03-13 DIAGNOSIS — S91101A Unspecified open wound of right great toe without damage to nail, initial encounter: Secondary | ICD-10-CM | POA: Diagnosis not present

## 2019-03-13 NOTE — Telephone Encounter (Signed)
[name] discussed her concerns about increasing needs of her father with care and added burden for her mother-parents want to remain in home. Pt lived with parents for awhile to assist with care. Sisters live in [place] and assist with shopping for supplies and financial arrangements. [name] states transfer to a skilled care facility would be difficulty with parents finances.  Mother who is dads primary care giver has untreated mental illness -likely bipolar per daughter and often has "episodes" when she is triggered. Pt feels she has control issues and when others try to assist with care this can be upsetting to her mother.  Father makes demands but her mother has difficulty letting other people assist to meet the demands feeling she can handle the situation better.  Recommend family discuss plans for care with parents to see if additional help will be welcomed by parents. Change referral to a different neurologist as daughter had a bad experience in the past with Dr. Felecia Shelling .

## 2019-03-13 NOTE — Telephone Encounter (Signed)
Patient daughter [name] would like to speak with Dr. Holly Bodily about some concerns she has for her father. She said this will take more than a couple minutes as she would like to address her mother as well.  Call back # [phone]

## 2019-03-14 ENCOUNTER — Telehealth: Payer: Self-pay | Admitting: *Deleted

## 2019-03-14 ENCOUNTER — Telehealth: Payer: Self-pay | Admitting: Neurology

## 2019-03-14 NOTE — Telephone Encounter (Signed)
Ok with me 

## 2019-03-14 NOTE — Telephone Encounter (Signed)
Pt assist forms for Eliquis faxed to [facility]

## 2019-03-14 NOTE — Telephone Encounter (Signed)
Okay with me for switch. 

## 2019-03-14 NOTE — Telephone Encounter (Signed)
Pt is a returning pt of Dr. Rexene Alberts. He is being referred back to our office for Memory loss and is requesting to switch his care to Dr. Jannifer Franklin. Is that switch ok?

## 2019-03-19 DIAGNOSIS — C61 Malignant neoplasm of prostate: Secondary | ICD-10-CM | POA: Diagnosis not present

## 2019-03-19 DIAGNOSIS — Z7984 Long term (current) use of oral hypoglycemic drugs: Secondary | ICD-10-CM | POA: Diagnosis not present

## 2019-03-19 DIAGNOSIS — E1165 Type 2 diabetes mellitus with hyperglycemia: Secondary | ICD-10-CM | POA: Diagnosis not present

## 2019-03-19 DIAGNOSIS — M6281 Muscle weakness (generalized): Secondary | ICD-10-CM | POA: Diagnosis not present

## 2019-03-19 DIAGNOSIS — L97519 Non-pressure chronic ulcer of other part of right foot with unspecified severity: Secondary | ICD-10-CM | POA: Diagnosis not present

## 2019-03-19 DIAGNOSIS — C8581 Other specified types of non-Hodgkin lymphoma, lymph nodes of head, face, and neck: Secondary | ICD-10-CM | POA: Diagnosis not present

## 2019-03-19 DIAGNOSIS — E43 Unspecified severe protein-calorie malnutrition: Secondary | ICD-10-CM | POA: Diagnosis not present

## 2019-03-19 DIAGNOSIS — I10 Essential (primary) hypertension: Secondary | ICD-10-CM | POA: Diagnosis not present

## 2019-03-19 DIAGNOSIS — R269 Unspecified abnormalities of gait and mobility: Secondary | ICD-10-CM | POA: Diagnosis not present

## 2019-03-19 DIAGNOSIS — I4821 Permanent atrial fibrillation: Secondary | ICD-10-CM | POA: Diagnosis not present

## 2019-03-21 ENCOUNTER — Telehealth: Payer: Self-pay | Admitting: Family Medicine

## 2019-03-21 DIAGNOSIS — L97519 Non-pressure chronic ulcer of other part of right foot with unspecified severity: Secondary | ICD-10-CM | POA: Diagnosis not present

## 2019-03-21 DIAGNOSIS — R269 Unspecified abnormalities of gait and mobility: Secondary | ICD-10-CM | POA: Diagnosis not present

## 2019-03-21 DIAGNOSIS — I10 Essential (primary) hypertension: Secondary | ICD-10-CM | POA: Diagnosis not present

## 2019-03-21 DIAGNOSIS — E1140 Type 2 diabetes mellitus with diabetic neuropathy, unspecified: Secondary | ICD-10-CM

## 2019-03-21 DIAGNOSIS — C61 Malignant neoplasm of prostate: Secondary | ICD-10-CM | POA: Diagnosis not present

## 2019-03-21 DIAGNOSIS — E1165 Type 2 diabetes mellitus with hyperglycemia: Secondary | ICD-10-CM | POA: Diagnosis not present

## 2019-03-21 DIAGNOSIS — C8581 Other specified types of non-Hodgkin lymphoma, lymph nodes of head, face, and neck: Secondary | ICD-10-CM | POA: Diagnosis not present

## 2019-03-21 NOTE — Telephone Encounter (Signed)
[name] is calling from [facility], she was out to see the patient today and states the patient glucometer is not giving accurate readings. It is reading extremely high or low. Once checking with the wifes meter it reads normal range. She would like to know if a new glucometer could be called in.   CB# [facility] [address] [place] - [address] [place] (Phone) [phone] (Fax)

## 2019-03-21 NOTE — Telephone Encounter (Signed)
Routing to Dr. Corum for advice ? 

## 2019-03-22 ENCOUNTER — Other Ambulatory Visit: Payer: Self-pay | Admitting: Family Medicine

## 2019-03-22 MED ORDER — BLOOD GLUCOSE METER KIT: PACK | 0 refills | Status: DC

## 2019-03-23 ENCOUNTER — Other Ambulatory Visit (HOSPITAL_COMMUNITY): Payer: Medicare Other

## 2019-03-23 ENCOUNTER — Ambulatory Visit (HOSPITAL_COMMUNITY): Payer: Medicare Other | Admitting: Hematology

## 2019-03-23 ENCOUNTER — Ambulatory Visit (HOSPITAL_COMMUNITY): Payer: Medicare Other

## 2019-03-24 ENCOUNTER — Other Ambulatory Visit: Payer: Self-pay

## 2019-03-27 ENCOUNTER — Encounter (HOSPITAL_COMMUNITY): Payer: Self-pay | Admitting: Hematology

## 2019-03-27 ENCOUNTER — Other Ambulatory Visit: Payer: Self-pay

## 2019-03-27 ENCOUNTER — Inpatient Hospital Stay (HOSPITAL_BASED_OUTPATIENT_CLINIC_OR_DEPARTMENT_OTHER): Payer: Medicare Other | Admitting: Hematology

## 2019-03-27 ENCOUNTER — Inpatient Hospital Stay (HOSPITAL_COMMUNITY): Payer: Medicare Other | Attending: Hematology

## 2019-03-27 ENCOUNTER — Encounter (HOSPITAL_COMMUNITY): Payer: Self-pay

## 2019-03-27 ENCOUNTER — Inpatient Hospital Stay (HOSPITAL_COMMUNITY): Payer: Medicare Other

## 2019-03-27 DIAGNOSIS — K219 Gastro-esophageal reflux disease without esophagitis: Secondary | ICD-10-CM | POA: Insufficient documentation

## 2019-03-27 DIAGNOSIS — E119 Type 2 diabetes mellitus without complications: Secondary | ICD-10-CM | POA: Diagnosis not present

## 2019-03-27 DIAGNOSIS — Z803 Family history of malignant neoplasm of breast: Secondary | ICD-10-CM | POA: Diagnosis not present

## 2019-03-27 DIAGNOSIS — I739 Peripheral vascular disease, unspecified: Secondary | ICD-10-CM | POA: Diagnosis not present

## 2019-03-27 DIAGNOSIS — Z8509 Personal history of malignant neoplasm of other digestive organs: Secondary | ICD-10-CM | POA: Insufficient documentation

## 2019-03-27 DIAGNOSIS — M1990 Unspecified osteoarthritis, unspecified site: Secondary | ICD-10-CM | POA: Diagnosis not present

## 2019-03-27 DIAGNOSIS — C7951 Secondary malignant neoplasm of bone: Secondary | ICD-10-CM | POA: Diagnosis not present

## 2019-03-27 DIAGNOSIS — R5383 Other fatigue: Secondary | ICD-10-CM | POA: Insufficient documentation

## 2019-03-27 DIAGNOSIS — C61 Malignant neoplasm of prostate: Secondary | ICD-10-CM | POA: Diagnosis not present

## 2019-03-27 DIAGNOSIS — Z85828 Personal history of other malignant neoplasm of skin: Secondary | ICD-10-CM | POA: Insufficient documentation

## 2019-03-27 DIAGNOSIS — Z800 Family history of malignant neoplasm of digestive organs: Secondary | ICD-10-CM | POA: Insufficient documentation

## 2019-03-27 DIAGNOSIS — Z7952 Long term (current) use of systemic steroids: Secondary | ICD-10-CM | POA: Diagnosis not present

## 2019-03-27 DIAGNOSIS — Z79899 Other long term (current) drug therapy: Secondary | ICD-10-CM | POA: Diagnosis not present

## 2019-03-27 DIAGNOSIS — Z7984 Long term (current) use of oral hypoglycemic drugs: Secondary | ICD-10-CM | POA: Insufficient documentation

## 2019-03-27 DIAGNOSIS — D472 Monoclonal gammopathy: Secondary | ICD-10-CM | POA: Diagnosis not present

## 2019-03-27 LAB — CBC WITH DIFFERENTIAL/PLATELET
Abs Immature Granulocytes: 0.22 10*3/uL — ABNORMAL HIGH (ref 0.00–0.07)
Basophils Absolute: 0 10*3/uL (ref 0.0–0.1)
Basophils Relative: 0 %
Eosinophils Absolute: 0 10*3/uL (ref 0.0–0.5)
Eosinophils Relative: 0 %
HCT: 36.7 % — ABNORMAL LOW (ref 39.0–52.0)
Hemoglobin: 12.3 g/dL — ABNORMAL LOW (ref 13.0–17.0)
Immature Granulocytes: 3 %
Lymphocytes Relative: 36 %
Lymphs Abs: 3.2 10*3/uL (ref 0.7–4.0)
MCH: 32.5 pg (ref 26.0–34.0)
MCHC: 33.5 g/dL (ref 30.0–36.0)
MCV: 96.8 fL (ref 80.0–100.0)
Monocytes Absolute: 1 10*3/uL (ref 0.1–1.0)
Monocytes Relative: 11 %
Neutro Abs: 4.3 10*3/uL (ref 1.7–7.7)
Neutrophils Relative %: 50 %
Platelets: 192 10*3/uL (ref 150–400)
RBC: 3.79 MIL/uL — ABNORMAL LOW (ref 4.22–5.81)
RDW: 12.7 % (ref 11.5–15.5)
WBC: 8.7 10*3/uL (ref 4.0–10.5)
nRBC: 0 % (ref 0.0–0.2)

## 2019-03-27 LAB — COMPREHENSIVE METABOLIC PANEL
ALT: 13 U/L (ref 0–44)
AST: 15 U/L (ref 15–41)
Albumin: 4.1 g/dL (ref 3.5–5.0)
Alkaline Phosphatase: 69 U/L (ref 38–126)
Anion gap: 12 (ref 5–15)
BUN: 23 mg/dL (ref 8–23)
CO2: 23 mmol/L (ref 22–32)
Calcium: 10.7 mg/dL — ABNORMAL HIGH (ref 8.9–10.3)
Chloride: 103 mmol/L (ref 98–111)
Creatinine, Ser: 0.91 mg/dL (ref 0.61–1.24)
GFR calc Af Amer: >60 mL/min (ref >60)
GFR calc non Af Amer: >60 mL/min (ref >60)
Glucose, Bld: 179 mg/dL — ABNORMAL HIGH (ref 70–99)
Potassium: 4.6 mmol/L (ref 3.5–5.1)
Sodium: 138 mmol/L (ref 135–145)
Total Bilirubin: 0.7 mg/dL (ref 0.3–1.2)
Total Protein: 7.2 g/dL (ref 6.5–8.1)

## 2019-03-27 LAB — PSA: Prostatic Specific Antigen: <0.01 ng/mL (ref 0.00–4.00)

## 2019-03-27 LAB — LACTATE DEHYDROGENASE: LDH: 95 U/L — ABNORMAL LOW (ref 98–192)

## 2019-03-27 MED ORDER — DENOSUMAB 120 MG/1.7ML ~~LOC~~ SOLN
120.0000 mg | Freq: Once | SUBCUTANEOUS | Status: AC
  Administered 2019-03-27: 120 mg via SUBCUTANEOUS

## 2019-03-27 MED ORDER — DENOSUMAB 120 MG/1.7ML ~~LOC~~ SOLN
SUBCUTANEOUS | Status: AC
  Filled 2019-03-27: qty 1.7

## 2019-03-27 NOTE — Progress Notes (Signed)
Patient taking calcium as directed.  Denied tooth, jaw, and leg pain.  No recent or upcoming dental visits.  Patient tolerated injection with no complaints voiced.  Site clean and dry with no bruising or swelling noted at site.  Band aid applied.  Vss with discharge and left by wheelchair with no s/s of distress noted.

## 2019-03-27 NOTE — Patient Instructions (Addendum)
[place] at [facility] Discharge Instructions  You were seen today by Dr. Delton Coombes. He went over your recent lab results. Continue taking Zytiga as directed as well as the prednisone. Continue monthly injections. He will see you back in 2 months for labs and follow up.   Thank you for choosing [place] at [facility] to provide your oncology and hematology care.  To afford each patient quality time with our provider, please arrive at least 15 minutes before your scheduled appointment time.   If you have a lab appointment with the [facility] please come in thru the  Main Entrance and check in at the main information desk  You need to re-schedule your appointment should you arrive 10 or more minutes late.  We strive to give you quality time with our providers, and arriving late affects you and other patients whose appointments are after yours.  Also, if you no show three or more times for appointments you may be dismissed from the clinic at the providers discretion.     Again, thank you for choosing [facility].  Our hope is that these requests will decrease the amount of time that you wait before being seen by our physicians.       _____________________________________________________________  Should you have questions after your visit to [facility], please contact our office at [phone] between the hours of 8:00 a.m. and 4:30 p.m.  Voicemails left after 4:00 p.m. will not be returned until the following business day.  For prescription refill requests, have your pharmacy contact our office and allow 72 hours.    Cancer Center Support Programs:   > Cancer Support Group  2nd Tuesday of the month 1pm-2pm, Journey Room

## 2019-03-27 NOTE — Progress Notes (Signed)
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  The Multi-Gene Panel offered by [facility] includes sequencing and/or deletion duplication testing of the following 80 genes: ALK, APC, ATM, AXIN2,BAP1,  BARD1, BLM, BMPR1A, BRCA1, BRCA2, BRIP1, CASR, CDC73, CDH1, CDK4, CDKN1B, CDKN1C, CDKN2A (p14ARF), CDKN2A (p16INK4a), CEBPA, CHEK2, CTNNA1, DICER1, DIS3L2, EGFR (c.2369C>T, p.Thr790Met variant only), EPCAM (Deletion/duplication testing only), FH, FLCN, GATA2, GPC3, GREM1 (Promoter region deletion/duplication testing only), HOXB13 (c.251G>A, p.Gly84Glu), HRAS, KIT, MAX, MEN1, MET, MITF (c.952G>A, p.Glu318Lys variant only), MLH1, MSH2, MSH3, MSH6, MUTYH, NBN, NF1, NF2, NTHL1, PALB2, PDGFRA, PHOX2B, PMS2, POLD1, POLE, POT1, PRKAR1A, PTCH1, PTEN, RAD50, RAD51C, RAD51D, RB1, RECQL4, RET, RUNX1, SDHAF2, SDHA (sequence changes only), SDHB, SDHC, SDHD, SMAD4, SMARCA4, SMARCB1, SMARCE1, STK11, SUFU, TERT, TERT, TMEM127, TP53, TSC1, TSC2, VHL, WRN and WT1.  The report date is February 08, 2017.    Prostate cancer ([place])  09/08/2013 Initial Diagnosis   Prostate cancer ([place])   02/08/2017 Genetic Testing   ATM Gain (Exons 62-63) VUS identified on the multi-gene panel.  The Multi-Gene Panel offered by [facility] includes sequencing and/or deletion duplication testing of the following 80 genes: ALK, APC, ATM, AXIN2,BAP1,  BARD1, BLM, BMPR1A, BRCA1, BRCA2, BRIP1, CASR, CDC73,  CDH1, CDK4, CDKN1B, CDKN1C, CDKN2A (p14ARF), CDKN2A (p16INK4a), CEBPA, CHEK2, CTNNA1, DICER1, DIS3L2, EGFR (c.2369C>T, p.Thr790Met variant only), EPCAM (Deletion/duplication testing only), FH, FLCN, GATA2, GPC3, GREM1 (Promoter region deletion/duplication testing only), HOXB13 (c.251G>A, p.Gly84Glu), HRAS, KIT, MAX, MEN1, MET, MITF (c.952G>A, p.Glu318Lys variant only), MLH1, MSH2, MSH3, MSH6, MUTYH, NBN, NF1, NF2, NTHL1, PALB2, PDGFRA, PHOX2B, PMS2, POLD1, POLE, POT1, PRKAR1A, PTCH1, PTEN, RAD50, RAD51C, RAD51D, RB1, RECQL4, RET, RUNX1, SDHAF2, SDHA (sequence changes only), SDHB, SDHC, SDHD, SMAD4, SMARCA4, SMARCB1, SMARCE1, STK11, SUFU, TERT, TERT, TMEM127, TP53, TSC1, TSC2, VHL, WRN and WT1.  The report date is February 08, 2017.       CANCER STAGING: Cancer Staging NHL (non-Hodgkin's lymphoma) (HCC) Staging form: Lymphoid Neoplasms, AJCC 6th Edition - Clinical: Stage III - Signed by Baird Cancer, PA on 08/18/2011    INTERVAL HISTORY:  [name] 85 y.o. male seen for follow-up of prostate cancer metastatic to the bones.  He reports tolerating Abiraterone very well.  He is taking 500 mg daily.  Appetite is 25%.  Energy levels are low.  Denies any new onset pains.  No fevers or infections reported.  Denies any urinary infection symptoms.  He was prescribed Macrobid to be used as needed for recurrent cystitis by Dr. Diona Foster.  REVIEW OF SYSTEMS:  Review of Systems  Constitutional: Positive for fatigue.  All other systems reviewed and are negative.    PAST MEDICAL/SURGICAL HISTORY:  Past Medical History:  Diagnosis Date  . Acute bronchitis   . Arthritis   . Atrial fibrillation ([place])    Dr. Harl Bowie- [facility] follows saw 11'14  . Bladder stones    tx. with oral meds and antibiotics, now surgery planned  . Cataracts, both eyes    surgery planned May 2015  . Diabetes mellitus without complication ([place])  Type II  . Dyspnea    with activity  . Dysrhythmia    Afib  . Family  history of breast cancer   . Family history of colon cancer   . GERD (gastroesophageal reflux disease)   . 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 Margot Chimes, thus far without recurrence.   . Hypertension   . NHL (non-Hodgkin's lymphoma) ([place]) 2005   Diffuse large B-cell lymphoma, clinically stage IIIA, CD20 positive, status post cervical lymph node biopsy 08/03/2003 on the left. PET scan was also positive in the spleen and small bowel region, but bone marrow aspiration and biopsy were negative. So he essentially had stage IIIAs. He received R-CHOP x6 cycles with CR established by PET scan criteria on 11/23/2003 with no evidence for relapse th  . PAD (peripheral artery disease) ([place]) 08/27/2017  . Skin cancer    Basal cell- face,head, neck,  arms, legs, Back   Past Surgical History:  Procedure Laterality Date  . AMPUTATION Left 06/29/2017   Procedure: AMPUTATION LEFT GREAT TOE;  Surgeon: [name], MD;  Location: [facility];  Service: Vascular;  Laterality: Left;  . AMPUTATION Left 08/27/2017   Procedure: AMPUTATION Left SECOND TOE;  Surgeon: [name], MD;  Location: [facility];  Service: Vascular;  Laterality: Left;  . CATARACT EXTRACTION Bilateral 2015  . CHOLECYSTECTOMY    . COLON RESECTION     small bowel  . CYSTOSCOPY WITH LITHOLAPAXY N/A 08/10/2013   Procedure: CYSTOSCOPY WITH LITHOLAPAXY WITH [name];  Surgeon: [name], MD;  [facility];  Service: Urology;  Laterality: N/A;  . ESOPHAGEAL DILATION N/A 02/27/2016   Procedure: ESOPHAGEAL DILATION;  Surgeon: [name], MD;  [facility];  Service: Endoscopy;  Laterality: N/A;  . ESOPHAGEAL DILATION N/A 07/07/2018   Procedure: ESOPHAGEAL DILATION;  Surgeon: [name], MD;  [facility];  Service: Endoscopy;  Laterality: N/A;  . ESOPHAGOGASTRODUODENOSCOPY N/A 02/27/2016   Procedure: ESOPHAGOGASTRODUODENOSCOPY (EGD);  Surgeon: [name], MD;  [facility];  Service: Endoscopy;  Laterality: N/A;  12:00  . ESOPHAGOGASTRODUODENOSCOPY N/A 07/07/2018   Procedure: ESOPHAGOGASTRODUODENOSCOPY (EGD);  Surgeon: [name], MD;  [facility];  Service: Endoscopy;  Laterality: N/A;  . INCISION AND DRAINAGE PERIRECTAL ABSCESS    . LOWER EXTREMITY ANGIOGRAPHY N/A 06/25/2017   Procedure: LOWER EXTREMITY ANGIOGRAPHY;  Surgeon: [name], MD;  Location: [facility];  Service: Cardiovascular;  Laterality: N/A;  . LYMPH NODE DISSECTION Left    '05-neck  . PORT-A-CATH REMOVAL    . PORTACATH PLACEMENT     insertion and removal -last chemotherapy 10 yrs ago  . TRANSURETHRAL RESECTION OF PROSTATE N/A 08/10/2013   Procedure: TRANSURETHRAL RESECTION OF THE PROSTATE WITH GYRUS INSTRUMENTS;  Surgeon: [name], MD;  [facility];  Service: Urology;  Laterality: N/A;     SOCIAL HISTORY:  Social History   Socioeconomic History  . Marital status: Married    Spouse name: Not on file  . Number of children: Not on file  . Years of education: Not on file  . Highest education level: Not on file  Occupational History  . Occupation: retired    Fish farm manager: RETIRED    Comment: salesman  Social Needs  .  Financial resource strain: Not on file  . Food insecurity    Worry: Not on file    Inability: Not on file  . Transportation needs    Medical: Not on file    Non-medical: Not on file  Tobacco Use  . Smoking status: Never Smoker  . Smokeless tobacco: Never Used  Substance and Sexual Activity  . Alcohol use: No  . Drug use: No  . Sexual activity: Not Currently  Lifestyle  . Physical activity    Days per week: Not on file    Minutes per session: Not on file  . Stress: Not on file  Relationships  .  Social Herbalist on phone: Not on file    Gets together: Not on file    Attends religious service: Not on file    Active member of club or organization: Not on file    Attends meetings of clubs or organizations: Not on file    Relationship status: Not on file  . Intimate partner violence    Fear of current or ex partner: Not on file    Emotionally abused: Not on file    Physically abused: Not on file    Forced sexual activity: Not on file  Other Topics Concern  . Not on file  Social History Narrative  . Not on file    FAMILY HISTORY:  Family History  Problem Relation Age of Onset  . Heart attack Mother        died in her 68s  . Other Father 56       pneumonia - died  . Breast cancer Sister        dx in her 69s-60s  . Colon cancer Brother        dx in his 80s  . Cancer Sister        TAH/BSO due to "male cancer"  . Breast cancer Daughter 11  . Breast cancer Daughter 77       DCIS - ATM VUS on [facility] 83 gene panel in 2018  . Breast cancer Daughter 31       DCIS - Negative on [facility] MyRisk 25 gene apnel in 2015  . Thyroid cancer Daughter 65       Medullary and Papillary  . Thyroid nodules Grandchild     CURRENT MEDICATIONS:  Outpatient Encounter Medications as of 03/27/2019  Medication Sig  . blood glucose meter kit and supplies Dispense based on patient and insurance preference. Use up to four times daily as directed. (FOR ICD-10 E10.9, E11.9).  . Calcium Polycarbophil (FIBER-CAPS PO) Take 1 capsule by mouth 2 (two) times daily.  . Cholecalciferol (VITAMIN D3) 1000 units CAPS Take 1,000 Units by mouth every morning.   . Cranberry-Vitamin C-Vitamin E (CRANBERRY PLUS VITAMIN C) 4200-20-3 MG-MG-UNIT CAPS Take 1 capsule by mouth every morning.  . Cyanocobalamin (VITAMIN B-12 IJ) Inject 1,000 mcg as directed every 30 (thirty) days.   . diphenhydrAMINE-APAP, sleep, (TYLENOL PM EXTRA STRENGTH PO) Take 1 tablet by mouth at bedtime.   . donepezil (ARICEPT) 10 MG  tablet Take 1 tablet (10 mg total) by mouth daily.  . fluticasone (FLONASE) 50 MCG/ACT nasal spray Place 2 sprays into both nostrils daily.  Marland Kitchen glipiZIDE (GLUCOTROL XL) 5 MG 24 hr tablet Take 5 mg by mouth daily with breakfast.   . levothyroxine (SYNTHROID) 100 MCG tablet Take 100 mcg by mouth daily before breakfast.  . lisinopril (PRINIVIL,ZESTRIL) 20 MG tablet Take 1 tablet (20  mg total) by mouth daily.  . metFORMIN (GLUCOPHAGE) 850 MG tablet Take 850 mg by mouth 2 (two) times daily with a meal.   . Multiple Vitamin (MULTIVITAMIN WITH MINERALS) TABS tablet Take 1 tablet by mouth every morning.  Marland Kitchen omeprazole (PRILOSEC) 40 MG capsule Take 40 mg by mouth every other day.   . Potassium Chloride (KLOR-CON 10 PO) Take 10 mg by mouth 2 (two) times daily.  . predniSONE (DELTASONE) 5 MG tablet Take 1/2 (one-half) tablet by mouth twice daily  . acetaminophen (TYLENOL) 500 MG tablet Take 500-1,000 mg by mouth every 8 (eight) hours as needed for mild pain, fever or headache.   . feeding supplement, ENSURE ENLIVE, (ENSURE ENLIVE) LIQD Take 237 mLs by mouth daily. CHOCOLATE   . loperamide (IMODIUM) 2 MG capsule Take 1 capsule (2 mg total) by mouth as needed for diarrhea or loose stools. (Patient not taking: Reported on 03/27/2019)  . [DISCONTINUED] amitriptyline (ELAVIL) 50 MG tablet Take 50 mg by mouth at bedtime.   No facility-administered encounter medications on file as of 03/27/2019.     ALLERGIES:  Allergies  Allergen Reactions  . Tape Other (See Comments)    SKIN IS VERY THIN AND WILL TEAR AND BRUISE VERY EASILY!!!!  . Augmentin [Amoxicillin-Pot Clavulanate] Rash and Other (See Comments)    Redness of skin     PHYSICAL EXAM:  ECOG Performance status: 1  Vitals:   03/27/19 1102  BP: 118/73  Pulse: 62  Resp: 20  Temp: (!) 96.6 F (35.9 C)  SpO2: 94%   Filed Weights   03/27/19 1102  Weight: 146 lb (66.2 kg)    Physical Exam Constitutional:      Appearance: Normal appearance. He is  normal weight.  Cardiovascular:     Rate and Rhythm: Normal rate and regular rhythm.     Heart sounds: Normal heart sounds.  Pulmonary:     Effort: Pulmonary effort is normal.     Breath sounds: Normal breath sounds.  Abdominal:     General: There is no distension.     Palpations: Abdomen is soft. There is no mass.  Musculoskeletal:        General: No swelling.  Skin:    General: Skin is warm.  Neurological:     Mental Status: He is alert and oriented to person, place, and time. Mental status is at baseline.  Psychiatric:        Mood and Affect: Mood normal.        Behavior: Behavior normal.      LABORATORY DATA:  I have reviewed the labs as listed.  CBC    Component Value Date/Time   WBC 8.7 03/27/2019 0941   RBC 3.79 (L) 03/27/2019 0941   HGB 12.3 (L) 03/27/2019 0941   HCT 36.7 (L) 03/27/2019 0941   PLT 192 03/27/2019 0941   MCV 96.8 03/27/2019 0941   MCH 32.5 03/27/2019 0941   MCHC 33.5 03/27/2019 0941   RDW 12.7 03/27/2019 0941   LYMPHSABS 3.2 03/27/2019 0941   MONOABS 1.0 03/27/2019 0941   EOSABS 0.0 03/27/2019 0941   BASOSABS 0.0 03/27/2019 0941   CMP Latest Ref Rng & Units 03/27/2019 02/27/2019 02/02/2019  Glucose 70 - 99 mg/dL 179(H) 161(H) 178(H)  BUN 8 - 23 mg/dL 23 19 28(H)  Creatinine 0.61 - 1.24 mg/dL 0.91 0.85 1.06  Sodium 135 - 145 mmol/L 138 139 140  Potassium 3.5 - 5.1 mmol/L 4.6 4.8 4.5  Chloride 98 - 111 mmol/L 103  106 113(H)  CO2 22 - 32 mmol/L 23 25 18(L)  Calcium 8.9 - 10.3 mg/dL 10.7(H) 10.2 9.7  Total Protein 6.5 - 8.1 g/dL 7.2 6.9 6.4(L)  Total Bilirubin 0.3 - 1.2 mg/dL 0.7 0.7 0.4  Alkaline Phos 38 - 126 U/L 69 65 51  AST 15 - 41 U/L 15 15 18   ALT 0 - 44 U/L 13 13 21        DIAGNOSTIC IMAGING:  I have independently reviewed the scans and discussed with the patient.     ASSESSMENT & PLAN:   Prostate cancer ([place]) 1.  Stage IV prostate cancer with bone metastasis, diagnosed in April 2015: -Abiraterone and prednisone started in  October 2016. -Last bone scan on 09/01/2017 showed interval improvement in T12 lesion.  No new lesions. -Abiraterone dose reduced to 500 mg once a day and prednisone 2.5 mg twice a day on 02/10/2018. -He is continuing to tolerate abiraterone 500 mg daily and prednisone 2.5 mg daily very well. -His last PSA was less than 0.01 on 01/25/2019.  PSA from today is pending. -Last Lupron was on 03/08/2019, 45 mg. -We reviewed blood work which is grossly unchanged.  We will see him back in 2 months for follow-up.  2.  Bone strengthening: -Labs on 02/02/2019 showed calcium level 9.7. -He is receiving denosumab monthly  3.  IgM MGUS: -SPEP done on 01/25/2019 showed M spike is stable at 0.4 g/dL light chain ratio of 0.91.  4.  DLBCL: -Diagnosed in 2005, he does not have any B symptoms or lymphadenopathy at this time.     Orders placed this encounter:  No orders of the defined types were placed in this encounter.     [name], MD [place] [phone]

## 2019-03-27 NOTE — Assessment & Plan Note (Signed)
1.  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 -Last bone scan on 09/01/2017 showed interval improvement in T12 lesion.  No new lesions. -Abiraterone dose reduced to 500 mg once a day and prednisone 2.5 mg twice a day on 02/10/2018. -He is continuing to tolerate abiraterone 500 mg daily and prednisone 2.5 mg daily very well. -His last PSA was less than 0.01 on 01/25/2019.  PSA from today is pending. -Last Lupron was on 03/08/2019, 45 mg. -We reviewed blood work which is grossly unchanged.  We will see him back in 2 months for follow-up.  2.  Bone strengthening: -Labs on 02/02/2019 showed calcium level 9.7. -He is receiving denosumab monthly  3.  IgM MGUS: -SPEP done on 01/25/2019 showed M spike is stable at 0.4 g/dL light chain ratio of 0.91.  4.  DLBCL: -Diagnosed in 2005, he does not have any B symptoms or lymphadenopathy at this time.

## 2019-03-30 DIAGNOSIS — Z08 Encounter for follow-up examination after completed treatment for malignant neoplasm: Secondary | ICD-10-CM | POA: Diagnosis not present

## 2019-03-30 DIAGNOSIS — L820 Inflamed seborrheic keratosis: Secondary | ICD-10-CM | POA: Diagnosis not present

## 2019-03-30 DIAGNOSIS — Z85828 Personal history of other malignant neoplasm of skin: Secondary | ICD-10-CM | POA: Diagnosis not present

## 2019-03-30 DIAGNOSIS — L821 Other seborrheic keratosis: Secondary | ICD-10-CM | POA: Diagnosis not present

## 2019-03-30 MED ORDER — BLOOD GLUCOSE METER KIT: PACK | 0 refills | Status: DC

## 2019-03-31 ENCOUNTER — Telehealth (HOSPITAL_COMMUNITY): Payer: Self-pay

## 2019-03-31 NOTE — Telephone Encounter (Signed)
Nutrition Follow-up:  Received message from RN, Caryl Pina to call and speak with patient's daughter [name] regarding patient's nutrition  Patient with prostate cancer stage IV currently on zytiga and denosumab monthly.    Spoke with daughter, [name] via phone.  Reports patient has reduced appetite, very picky eater all his life, has trouble swallowing (numerous test and surgical procedure to help), trouble chewing due to poor dentition and weight loss.  Daughter reports that patient eats about 1 meal per day.  Wife cooks and feelings are hurt when patient does not eat.  Recently has been likely [employer] chicken.  Seems that one day patient likes something but next day does not.  Has been drinking ensure plus with ice cream mixed in recently and really likes it.  Daughter concerned about blood glucose.   Medications: reviewed  Labs: glucose 179  Anthropometrics:   Weight 146 lb decreased from 148 lb on 10/15.  Weight has gradually been falling for the last few years.    NUTRITION DIAGNOSIS: Inadequate oral intake continues    INTERVENTION:  Discussed strategies to increase calories and protein with daughter [name]. Recommend liberalizing diet, no diet restrictions.  Would encourage diabetes medication adjustment vs restricting intake at this time.  Encouraged patient to continue drinking ensure plus mixed with ice cream for high calories and protein vs switching to lower sugar shake (glucerna or boost glucose control).   Daughter had to leave call early due to receiving another call that she needed to take.  Daughter to call RD back.      MONITORING, EVALUATION, GOAL: Patient will consume adequate calories and protein to maintain weight   NEXT VISIT: as needed, dtr to call RD as needed.   [name] B. Zenia Resides, [place], [place] Registered Dietitian [phone] (pager)

## 2019-03-31 NOTE — Telephone Encounter (Signed)
Nutrition  Daughter [name] called back and wants to meet with RD face to face for an appointment and bring patient's wife.  She prepares most of patient's meals but does not hear well and phone visit is hard for wife.  Appointment made for 9:15 on 12/18.  Genevieve Arbaugh B. Zenia Resides, [place], [place] Registered Dietitian [phone] (pager)

## 2019-04-04 ENCOUNTER — Telehealth: Payer: Self-pay | Admitting: Family Medicine

## 2019-04-04 NOTE — Telephone Encounter (Signed)
Patient daughter [name] is calling and states they spoke with a dietician about helping the patient gain weight and not lose anymore. She states they told her to focus on calories and less sugar levels. She would like to know what is acceptable sugar readings since she is wanting them to focus on more calorie intake and would like Dr. Holly Bodily input on this.   Call back [phone]

## 2019-04-05 NOTE — Telephone Encounter (Signed)
Routing to Dr. Corum for advice ? 

## 2019-04-05 NOTE — Telephone Encounter (Signed)
Goal of fasting glucose 100-120 Goal of post prandial 150 Weight gain would focus on overall nutrition including protein. Pt would avoid simple sugar -ie-soda, pasta, rice, bread, cookies, pies, ice cream. Carbs in dairy and fruit in limited amounts are acceptable.

## 2019-04-05 NOTE — Telephone Encounter (Signed)
[name] is aware of recommendations but Dr. Raliegh Ip is telling them to go home and eat Ice Cream eat anything he wants to drink his ensure + icecream with chocolate syrup but (daughter) is asking to speak to you directly please call her at [phone], He has a tiny sore on his Big Toe under his toenail and it has not healed.

## 2019-04-05 NOTE — Telephone Encounter (Signed)
I am happy to set up a phone visit for this afternoon with pt and his daughter. Schedule at the end of the day.

## 2019-04-05 NOTE — Telephone Encounter (Signed)
Spoke with [name] and she states she will not be at her parents house until tomorrow afternoon and Friday and could not do the phone call with him until then. I informed her that Dr. Holly Bodily was off tomorrow and Friday. [name] states she would not be back in until after Christmas and suggest that Dr. Holly Bodily get with Dr. Raliegh Ip and they compromise on something and then let them know what they decide since two doctors are saying different things.

## 2019-04-06 NOTE — Telephone Encounter (Signed)
D/w pts daughter concerns about high carb diet vs combination of carb/protein for energy and calories. Protein shakes, pudding, soup to minimize chewing, maximize calories and entice patient to consume a balanced diet. Avoid simple sugars. Goal to improve nutrition for healing while being mindful of diabetic diagnosis and medication that can elevated glucose levels.  pts wife cooks for the patient and has difficulty with patients food choices. Daughter coming to assist family during the holidays.Marland Kitchen

## 2019-04-07 ENCOUNTER — Inpatient Hospital Stay (HOSPITAL_COMMUNITY): Payer: Medicare Other

## 2019-04-07 ENCOUNTER — Other Ambulatory Visit: Payer: Self-pay

## 2019-04-07 NOTE — Progress Notes (Signed)
Nutrition Follow-up:  Patient with prostate cancer stage IV currently on zytiga and denosumab monthly.   Met with wife and daughter [name] in clinic this am to discuss patient's nutrition and wife is primary caregiver and has hard time hearing via phone.  Wife prepares meals for patient.  Wife gets upset when patient does not want to eat or is critical of foods she prepares.   Wife reports that patient's appetite has decreased and not eating as much as before, has trouble swallowing foods (numerous test and surgical procedures to help), trouble chewing due to poor dentition and weight loss.  Wife reports that patient has limited foods that he will eat (potatoes, broccoli, corn, [employer] chicken, egg custard made with splenda, chocolate pie with splenda, chocolate syrup made with splenda, boiled ham, sweet potatoes, banana pudding made with splenda, vienna sausage, deviled ham, fish, popcorn shrimp, chicken salad.  Loves sandwiches and has to always have slice of bread with every meal and has for 71 years.  Drinks 1 ensure plus daily with ice cream added.   Medications: predinsone, glipizide, MVI, prilosec, metformin  Labs: reviewed from 12/7  Anthropometrics:   Weight 146 lb decreased from 148 lb on 10/15.   150 lb on 03/30/2018 3% weight loss in the last year.  Wife reports weight of 200 pounds maybe 3 years ago.  Noted 191 lb in 10/2015   NUTRITION DIAGNOSIS: Inadequate oral intake continues   INTERVENTION:  Discussed strategies to increase calories and protein in diet.  Emphasized ways to increase calories and protein but not carbohydrate to help with better blood glucose control.  Written education materials from [facility] provided to wife entitled High Calorie, High protein Nutrition Therapy.  Provided samples of lower carbohydrate oral nutrition supplement shakes for wife to try with patient. Educated wife and daughter on difference in calories, protein and carbohydrate with different oral nutrition  supplement shakes.  Encouraged small frequent offerings of foods.   Encouraged protein food at every meal/snack.   Contact information provided.   MONITORING, EVALUATION, GOAL: Patient will consume adequate calories and protein to meet nutritional needs and maintain weight   NEXT VISIT: no follow-up.  Wife/daughter to contact RD with questions  Skyelar Halliday B. Zenia Resides, [place], [place] Registered Dietitian [phone] (pager)

## 2019-04-17 ENCOUNTER — Telehealth: Payer: Self-pay | Admitting: Family Medicine

## 2019-04-17 NOTE — Telephone Encounter (Signed)
[name] from [facility] is calling and was out to the see the patient today and states the wife has been giving a whole tablet of Mirtazapine 15mg  instead of half a tablet as prescribed. She states she was told from Dr. Holly Bodily this was okay and the nurse is wanting a new prescription sent into the patient pharmacy so that the patient does not run out of medication. [address] [place], [place] [address] [place] Phone:  [phone]  Fax:  [phone]

## 2019-04-17 NOTE — Telephone Encounter (Signed)
Routing to Dr. Corum for advice ? 

## 2019-04-17 NOTE — Telephone Encounter (Signed)
Check with pharmacy-this medication is not on pts medication list. See who prescribed the medication and the sig given on the prescription

## 2019-04-18 ENCOUNTER — Telehealth: Payer: Self-pay

## 2019-04-18 ENCOUNTER — Institutional Professional Consult (permissible substitution): Payer: Medicare Other | Admitting: Neurology

## 2019-04-18 DIAGNOSIS — R269 Unspecified abnormalities of gait and mobility: Secondary | ICD-10-CM | POA: Diagnosis not present

## 2019-04-18 DIAGNOSIS — I4821 Permanent atrial fibrillation: Secondary | ICD-10-CM | POA: Diagnosis not present

## 2019-04-18 DIAGNOSIS — I10 Essential (primary) hypertension: Secondary | ICD-10-CM | POA: Diagnosis not present

## 2019-04-18 DIAGNOSIS — C61 Malignant neoplasm of prostate: Secondary | ICD-10-CM | POA: Diagnosis not present

## 2019-04-18 DIAGNOSIS — M6281 Muscle weakness (generalized): Secondary | ICD-10-CM | POA: Diagnosis not present

## 2019-04-18 DIAGNOSIS — C8581 Other specified types of non-Hodgkin lymphoma, lymph nodes of head, face, and neck: Secondary | ICD-10-CM | POA: Diagnosis not present

## 2019-04-18 DIAGNOSIS — E43 Unspecified severe protein-calorie malnutrition: Secondary | ICD-10-CM | POA: Diagnosis not present

## 2019-04-18 DIAGNOSIS — E1165 Type 2 diabetes mellitus with hyperglycemia: Secondary | ICD-10-CM | POA: Diagnosis not present

## 2019-04-18 DIAGNOSIS — L97519 Non-pressure chronic ulcer of other part of right foot with unspecified severity: Secondary | ICD-10-CM | POA: Diagnosis not present

## 2019-04-18 DIAGNOSIS — Z7984 Long term (current) use of oral hypoglycemic drugs: Secondary | ICD-10-CM | POA: Diagnosis not present

## 2019-04-18 MED ORDER — MIRTAZAPINE 15 MG PO TABS: 15.0000 mg | ORAL_TABLET | Freq: Every day | ORAL | 0 refills | Status: DC

## 2019-04-18 NOTE — Telephone Encounter (Signed)
done

## 2019-04-18 NOTE — Telephone Encounter (Signed)
[name], CMA  

## 2019-04-18 NOTE — Telephone Encounter (Signed)
The original Rx of Mirtazapine 15 mg came from Dr. Legrand Rams and and now the patient is out of the medication and needs a refill , Patients toe is getting worse but has an appointment on 05/02/2019 and the podiatrist stated to use betadine until then what do you recommend?

## 2019-04-18 NOTE — Telephone Encounter (Signed)
Would suggest if toe is worsening-to ask for appt sooner due to symptoms changing We can refill medication at 15mg  #30 1 r/f-please add to list and refill

## 2019-04-20 ENCOUNTER — Other Ambulatory Visit (HOSPITAL_COMMUNITY): Payer: Self-pay | Admitting: *Deleted

## 2019-04-20 DIAGNOSIS — C61 Malignant neoplasm of prostate: Secondary | ICD-10-CM

## 2019-04-20 DIAGNOSIS — C49A0 Gastrointestinal stromal tumor, unspecified site: Secondary | ICD-10-CM

## 2019-04-20 DIAGNOSIS — D472 Monoclonal gammopathy: Secondary | ICD-10-CM

## 2019-04-24 ENCOUNTER — Inpatient Hospital Stay (HOSPITAL_COMMUNITY): Payer: Medicare Other

## 2019-04-24 ENCOUNTER — Encounter (HOSPITAL_COMMUNITY): Payer: Self-pay

## 2019-04-24 ENCOUNTER — Inpatient Hospital Stay (HOSPITAL_COMMUNITY): Payer: Medicare Other | Attending: Hematology

## 2019-04-24 ENCOUNTER — Other Ambulatory Visit: Payer: Self-pay

## 2019-04-24 VITALS — BP 114/62 | HR 90 | Temp 97.6°F | Resp 18

## 2019-04-24 DIAGNOSIS — C7951 Secondary malignant neoplasm of bone: Secondary | ICD-10-CM | POA: Diagnosis not present

## 2019-04-24 DIAGNOSIS — C49A0 Gastrointestinal stromal tumor, unspecified site: Secondary | ICD-10-CM

## 2019-04-24 DIAGNOSIS — C61 Malignant neoplasm of prostate: Secondary | ICD-10-CM

## 2019-04-24 DIAGNOSIS — D472 Monoclonal gammopathy: Secondary | ICD-10-CM

## 2019-04-24 LAB — COMPREHENSIVE METABOLIC PANEL
ALT: 15 U/L (ref 0–44)
AST: 16 U/L (ref 15–41)
Albumin: 3.9 g/dL (ref 3.5–5.0)
Alkaline Phosphatase: 64 U/L (ref 38–126)
Anion gap: 10 (ref 5–15)
BUN: 27 mg/dL — ABNORMAL HIGH (ref 8–23)
CO2: 24 mmol/L (ref 22–32)
Calcium: 10.5 mg/dL — ABNORMAL HIGH (ref 8.9–10.3)
Chloride: 106 mmol/L (ref 98–111)
Creatinine, Ser: 0.94 mg/dL (ref 0.61–1.24)
GFR calc Af Amer: >60 mL/min (ref >60)
GFR calc non Af Amer: >60 mL/min (ref >60)
Glucose, Bld: 131 mg/dL — ABNORMAL HIGH (ref 70–99)
Potassium: 5 mmol/L (ref 3.5–5.1)
Sodium: 140 mmol/L (ref 135–145)
Total Bilirubin: 0.7 mg/dL (ref 0.3–1.2)
Total Protein: 6.9 g/dL (ref 6.5–8.1)

## 2019-04-24 MED ORDER — DENOSUMAB 120 MG/1.7ML ~~LOC~~ SOLN
120.0000 mg | Freq: Once | SUBCUTANEOUS | Status: AC
  Administered 2019-04-24: 120 mg via SUBCUTANEOUS

## 2019-04-24 MED ORDER — DENOSUMAB 120 MG/1.7ML ~~LOC~~ SOLN
SUBCUTANEOUS | Status: AC
  Filled 2019-04-24: qty 1.7

## 2019-04-25 DIAGNOSIS — L97519 Non-pressure chronic ulcer of other part of right foot with unspecified severity: Secondary | ICD-10-CM | POA: Diagnosis not present

## 2019-04-25 DIAGNOSIS — C61 Malignant neoplasm of prostate: Secondary | ICD-10-CM | POA: Diagnosis not present

## 2019-04-25 DIAGNOSIS — R269 Unspecified abnormalities of gait and mobility: Secondary | ICD-10-CM | POA: Diagnosis not present

## 2019-04-25 DIAGNOSIS — I10 Essential (primary) hypertension: Secondary | ICD-10-CM | POA: Diagnosis not present

## 2019-04-25 DIAGNOSIS — E1165 Type 2 diabetes mellitus with hyperglycemia: Secondary | ICD-10-CM | POA: Diagnosis not present

## 2019-04-25 DIAGNOSIS — C8581 Other specified types of non-Hodgkin lymphoma, lymph nodes of head, face, and neck: Secondary | ICD-10-CM | POA: Diagnosis not present

## 2019-04-26 ENCOUNTER — Telehealth: Payer: Self-pay | Admitting: Family Medicine

## 2019-04-26 DIAGNOSIS — E1140 Type 2 diabetes mellitus with diabetic neuropathy, unspecified: Secondary | ICD-10-CM

## 2019-04-26 MED ORDER — GLUCOSE BLOOD VI STRP: ORAL_STRIP | 12 refills | Status: DC

## 2019-04-26 NOTE — Telephone Encounter (Signed)
Patient is calling and requesting a refill on EasyMax test strips.   [place], [place] Phone:  [phone]  Fax:  [phone]

## 2019-04-26 NOTE — Telephone Encounter (Signed)
Done

## 2019-05-02 ENCOUNTER — Encounter (HOSPITAL_BASED_OUTPATIENT_CLINIC_OR_DEPARTMENT_OTHER): Payer: Medicare Other | Admitting: Internal Medicine

## 2019-05-02 ENCOUNTER — Other Ambulatory Visit: Payer: Self-pay

## 2019-05-02 DIAGNOSIS — Z8572 Personal history of non-Hodgkin lymphomas: Secondary | ICD-10-CM | POA: Insufficient documentation

## 2019-05-02 DIAGNOSIS — I4891 Unspecified atrial fibrillation: Secondary | ICD-10-CM | POA: Diagnosis not present

## 2019-05-02 DIAGNOSIS — Z7901 Long term (current) use of anticoagulants: Secondary | ICD-10-CM | POA: Insufficient documentation

## 2019-05-02 DIAGNOSIS — I10 Essential (primary) hypertension: Secondary | ICD-10-CM | POA: Insufficient documentation

## 2019-05-02 DIAGNOSIS — C7951 Secondary malignant neoplasm of bone: Secondary | ICD-10-CM | POA: Insufficient documentation

## 2019-05-02 DIAGNOSIS — Z89412 Acquired absence of left great toe: Secondary | ICD-10-CM | POA: Insufficient documentation

## 2019-05-02 DIAGNOSIS — B351 Tinea unguium: Secondary | ICD-10-CM | POA: Diagnosis not present

## 2019-05-02 DIAGNOSIS — C61 Malignant neoplasm of prostate: Secondary | ICD-10-CM | POA: Diagnosis not present

## 2019-05-02 DIAGNOSIS — E1151 Type 2 diabetes mellitus with diabetic peripheral angiopathy without gangrene: Secondary | ICD-10-CM | POA: Insufficient documentation

## 2019-05-02 DIAGNOSIS — Z89422 Acquired absence of other left toe(s): Secondary | ICD-10-CM | POA: Diagnosis not present

## 2019-05-02 DIAGNOSIS — Z85828 Personal history of other malignant neoplasm of skin: Secondary | ICD-10-CM | POA: Insufficient documentation

## 2019-05-02 DIAGNOSIS — E1142 Type 2 diabetes mellitus with diabetic polyneuropathy: Secondary | ICD-10-CM | POA: Diagnosis not present

## 2019-05-02 DIAGNOSIS — L97511 Non-pressure chronic ulcer of other part of right foot limited to breakdown of skin: Secondary | ICD-10-CM | POA: Insufficient documentation

## 2019-05-02 DIAGNOSIS — Z9221 Personal history of antineoplastic chemotherapy: Secondary | ICD-10-CM | POA: Insufficient documentation

## 2019-05-02 DIAGNOSIS — E11621 Type 2 diabetes mellitus with foot ulcer: Secondary | ICD-10-CM | POA: Insufficient documentation

## 2019-05-02 DIAGNOSIS — Z7984 Long term (current) use of oral hypoglycemic drugs: Secondary | ICD-10-CM | POA: Insufficient documentation

## 2019-05-02 NOTE — Progress Notes (Signed)
HARY, KIDO ([id number]) Visit Report for 05/02/2019 Chief Complaint Document Details Patient Name: Date of Service: DELVONTE, SOMOZA 05/02/2019 9:00 AM Medical Record [id number] Patient Account Number: [id number] Date of Birth/Sex: Treating RN: 02-28-1924 (86 y.o. Male) Carlene Coria Primary Care Provider: Benny Lennert Other Clinician: Referring Provider: Treating Provider/Extender:Jaquelin Meaney, Shayne Alken, LISA Weeks in Treatment: 0 Information Obtained from: Patient Chief Complaint 05/02/2019; patient is here for review of the wound on the tip of the right great toe right at the tip of his toenail Electronic Signature(s) Signed: 05/02/2019 6:16:53 PM By: Linton Ham MD Entered By: Linton Ham on 05/02/2019 10:47:07 -------------------------------------------------------------------------------- Debridement Details Patient Name: Date of Service: [name] 05/02/2019 9:00 AM Medical Record [id number] Patient Account Number: [id number] Date of Birth/Sex: 12/05/1923 (86 y.o. Male) Treating RN: Carlene Coria Primary Care Provider: Benny Lennert Other Clinician: Referring Provider: Treating Provider/Extender:Marianela Mandrell, Shayne Alken, LISA Weeks in Treatment: 0 Debridement Performed for Wound #1 Right Toe Great Assessment: Performed By: Physician Ricard Dillon., MD Debridement Type: Debridement Severity of Tissue Pre Fat layer exposed Debridement: Level of Consciousness (Pre- Awake and Alert procedure): Pre-procedure Verification/Time Out Taken: Yes - 10:24 Start Time: 10:24 Pain Control: Lidocaine 5% topical ointment Total Area Debrided (L x W): 0.1 (cm) x 0.8 (cm) = 0.08 (cm) Tissue and other material Viable, Non-Viable, Subcutaneous, Skin: Dermis , Skin: Epidermis, Other: nail debrided: Level: Skin/Subcutaneous Tissue Debridement Description: Excisional Instrument: Curette, Nippers Bleeding: Moderate Hemostasis Achieved: Silver Nitrate End Time:  10:28 Procedural Pain: 0 Post Procedural Pain: 0 Response to Treatment: Procedure was tolerated well Level of Consciousness Awake and Alert (Post-procedure): Post Debridement Measurements of Total Wound Length: (cm) 0.6 Width: (cm) 1.6 Depth: (cm) 0.1 Volume: (cm) 0.075 Character of Wound/Ulcer Post Improved Debridement: Severity of Tissue Post Debridement: Fat layer exposed Post Procedure Diagnosis Same as Pre-procedure Electronic Signature(s) Signed: 05/02/2019 6:16:53 PM By: Linton Ham MD Signed: 05/02/2019 6:18:05 PM By: Carlene Coria RN Entered By: Linton Ham on 05/02/2019 10:46:34 -------------------------------------------------------------------------------- HPI Details Patient Name: Date of Service: [name] 05/02/2019 9:00 AM Medical Record [id number] Patient Account Number: [id number] Date of Birth/Sex: Treating RN: 09-14-1923 (86 y.o. Male) Carlene Coria Primary Care Provider: Benny Lennert Other Clinician: Referring Provider: Treating Provider/Extender:Brett Soza, Shayne Alken, LISA Weeks in Treatment: 0 History of Present Illness HPI Description: ADMISSION 05/02/2019 This is a 86 year old man accompanied by his daughter. He lives in [place] with his elderly wife. He has home caregivers as well. Apparently on February 28, 2019 they noted blood on his sock and noticed a wound on the tip of his right great toe. He has been cared for by Dr. Posey Pronto who is his podiatrist in [place]. Apparently they have been using Betadine to this area. The patient has a thick mycotic nail on the right as well which is embedded in the wound in some areas. Fortunately the patient has diabetic neuropathy and is not really experiencing any pain otherwise I think this would all be quite painful. The patient was previously treated in 2019 for wounds on the left foot. This included a fairly thorough vascular review by vein and vascular Dr. Oneida Alar. His  ABIs at that time were noncompressible bilaterally TBI on the right was 0.88 with biphasic waveforms on the right and monophasic waveforms at the left. He ended up having amputations of the left first and second toes. He did have an angiogram on 06/25/2017 which showed on the right the right common femoral profundofemoral for Morris and superficial femoral arteries were  widely patent the right popliteal was widely patent. Tibial vessels were not well opacified secondary to motion artifact however he was felt to have all 3 tibial vessels intact with some calcification and some areas of stenosis within the posterior tibial artery but relatively good flow to the right lower leg. He also had the left reviewed as well which was the source of the problem at that time. Past medical history; prostate cancer on oral antiandrogen therapy with bone mets, GI stromal tumor in 2005, type 2 diabetes with peripheral neuropathy, hypertension, atrial fibrillation on Eliquis, history of non-Hodgkin's lymphoma treated with CHOP in 2005. He had amputations of the left first and second toes secondary to osteomyelitis. Does not appear that he has had an x-ray of the right foot ABI in our clinic on the right was noncompressible [see full arterial discussion above]. Electronic Signature(s) Signed: 05/02/2019 6:16:53 PM By: Linton Ham MD Entered By: Linton Ham on 05/02/2019 10:51:43 -------------------------------------------------------------------------------- Physical Exam Details Patient Name: Date of Service: ADRIC, REVIER 05/02/2019 9:00 AM Medical Record [id number] Patient Account Number: [id number] Date of Birth/Sex: Treating RN: September 21, 1923 (86 y.o. Male) Carlene Coria Primary Care Provider: Benny Lennert Other Clinician: Referring Provider: Treating Provider/Extender:Laylia Mui, Shayne Alken, LISA Weeks in Treatment: 0 Constitutional Sitting or standing Blood Pressure is within target range for  patient.. Pulse regular and within target range for patient.Marland Kitchen Respirations regular, non-labored and within target range.. Temperature is normal and within the target range for the patient.Marland Kitchen Appears in no distress. Ears, Nose, Mouth, and Throat Patient is hard of hearing.Marland Kitchen Respiratory work of breathing is normal. Cardiovascular Popliteal and femoral pulses palpable on the right. It will pulses are palpable on the right. Lymphatic None palpable in the right popliteal or inguinal area. Neurological Diabetic insensate neuropathy. Psychiatric appears at normal baseline. Notes Wound exam; the wound was in a particularly awkward spot at the tip of the right great toe under the thick mycotic nail. And some points of this the nail was pointing down into the wound bed. Using rongeurs I removed the overlying nail. Fortunately he is insensate and he did not need a nerve block. I was able to identify normal looking skin at some point which was gratifying. I then removed thick skin and subcutaneous tissue from the other end of the wound margin away from the nail to fully identify the wound. Hemostasis with silver nitrate Electronic Signature(s) Signed: 05/02/2019 6:16:53 PM By: Linton Ham MD Entered By: Linton Ham on 05/02/2019 10:53:38 -------------------------------------------------------------------------------- Physician Orders Details Patient Name: Date of Service: LEBARON, DAMBRA 05/02/2019 9:00 AM Medical Record [id number] Patient Account Number: [id number] Date of Birth/Sex: Treating RN: August 10, 1923 (86 y.o. Male) Carlene Coria Primary Care Provider: Benny Lennert Other Clinician: Referring Provider: Treating Provider/Extender:Elyssia Strausser, Shayne Alken, LISA Weeks in Treatment: 0 Verbal / Phone Orders: No Diagnosis Coding Follow-up Appointments Return Appointment in 1 week. Dressing Change Frequency Change dressing every day. Wound Cleansing Clean wound with Normal  Saline. May shower and wash wound with soap and water. Primary Wound Dressing Calcium Alginate with Silver Secondary Dressing Dry Gauze - secure with tape Off-Loading Other: - SURGICAL SANDLE TO [facility] skilled nursing for wound care. - [facility] Electronic Signature(s) Signed: 05/02/2019 6:16:53 PM By: Linton Ham MD Signed: 05/02/2019 6:18:05 PM By: Carlene Coria RN Entered By: Carlene Coria on 05/02/2019 10:33:38 -------------------------------------------------------------------------------- Problem List Details Patient Name: Date of Service: JAHMAD, MERCEDES 05/02/2019 9:00 AM Medical Record [id number] Patient Account Number: [id number] Date of Birth/Sex:  Treating RN: [birth year] (86 y.o. Male) Carlene Coria Primary Care Provider: Benny Lennert Other Clinician: Referring Provider: Treating Provider/Extender:Braiden Rodman, Shayne Alken, LISA Weeks in Treatment: 0 Active Problems ICD-10 Evaluated Encounter Code Description Active Date Today Diagnosis E11.621 Type 2 diabetes mellitus with foot ulcer 05/02/2019 No Yes L97.511 Non-pressure chronic ulcer of other part of right foot 05/02/2019 No Yes limited to breakdown of skin E11.42 Type 2 diabetes mellitus with diabetic polyneuropathy 05/02/2019 No Yes E11.51 Type 2 diabetes mellitus with diabetic peripheral 05/02/2019 No Yes angiopathy without gangrene B35.1 Tinea unguium 05/02/2019 No Yes Inactive Problems Resolved Problems Electronic Signature(s) Signed: 05/02/2019 6:16:53 PM By: Linton Ham MD Entered By: Linton Ham on 05/02/2019 10:45:49 -------------------------------------------------------------------------------- Progress Note Details Patient Name: Date of Service: [name] 05/02/2019 9:00 AM Medical Record [id number] Patient Account Number: [id number] Date of Birth/Sex: Treating RN: 1923/05/13 (86 y.o. Male) Carlene Coria Primary Care Provider: Benny Lennert Other Clinician: Referring Provider: Treating Provider/Extender:Yosiel Thieme, Shayne Alken, LISA Weeks in Treatment: 0 Subjective Chief Complaint Information obtained from Patient 05/02/2019; patient is here for review of the wound on the tip of the right great toe right at the tip of his toenail History of Present Illness (HPI) ADMISSION 05/02/2019 This is a 86 year old man accompanied by his daughter. He lives in [place] with his elderly wife. He has home caregivers as well. Apparently on February 28, 2019 they noted blood on his sock and noticed a wound on the tip of his right great toe. He has been cared for by Dr. Posey Pronto who is his podiatrist in [place]. Apparently they have been using Betadine to this area. The patient has a thick mycotic nail on the right as well which is embedded in the wound in some areas. Fortunately the patient has diabetic neuropathy and is not really experiencing any pain otherwise I think this would all be quite painful. The patient was previously treated in 2019 for wounds on the left foot. This included a fairly thorough vascular review by vein and vascular Dr. Oneida Alar. His ABIs at that time were noncompressible bilaterally TBI on the right was 0.88 with biphasic waveforms on the right and monophasic waveforms at the left. He ended up having amputations of the left first and second toes. He did have an angiogram on 06/25/2017 which showed on the right the right common femoral profundofemoral for Morris and superficial femoral arteries were widely patent the right popliteal was widely patent. Tibial vessels were not well opacified secondary to motion artifact however he was felt to have all 3 tibial vessels intact with some calcification and some areas of stenosis within the posterior tibial artery but relatively good flow to the right lower leg. He also had the left reviewed as well which was the source of the problem at that time. Past  medical history; prostate cancer on oral antiandrogen therapy with bone mets, GI stromal tumor in 2005, type 2 diabetes with peripheral neuropathy, hypertension, atrial fibrillation on Eliquis, history of non-Hodgkin's lymphoma treated with CHOP in 2005. He had amputations of the left first and second toes secondary to osteomyelitis. Does not appear that he has had an x-ray of the right foot ABI in our clinic on the right was noncompressible [see full arterial discussion above]. Patient History Information obtained from Patient. Allergies Augmentin (Severity: Severe, Reaction: rash) Family History Cancer - Siblings,Child, Diabetes - Siblings, Heart Disease - Mother, Hypertension - Child, Thyroid Problems - Siblings, No family history of Hereditary Spherocytosis, Kidney Disease, Lung Disease, Seizures, Stroke,  Tuberculosis. Social History Never smoker, Marital Status - Married, Alcohol Use - Never, Drug Use - No History, Caffeine Use - Daily - coffee. Medical History Eyes Patient has history of Cataracts - bil removed Denies history of Glaucoma, Optic Neuritis Cardiovascular Patient has history of Arrhythmia - afib, Hypertension, Peripheral Arterial Disease Endocrine Patient has history of Type II Diabetes Denies history of Type I Diabetes Genitourinary Denies history of End Stage Renal Disease Integumentary (Skin) Denies history of History of Burn Musculoskeletal Patient has history of Osteomyelitis - left great toe Denies history of Gout, Rheumatoid Arthritis, Osteoarthritis Neurologic Patient has history of Dementia, Neuropathy Denies history of Quadriplegia, Paraplegia, Seizure Disorder Oncologic Patient has history of Received Chemotherapy Denies history of Received Radiation Psychiatric Denies history of Anorexia/bulimia, Confinement Anxiety Patient is treated with Oral Agents. Blood sugar is tested. Hospitalization/Surgery History - left great and 2nd toe amputation. -  bil cataract removed. - cholecystectomy. - colon resection. - cycstoscopy. - esophageal dilitation. - IandD perirectal abscess. - left neck lymph node resection. - TURP. Medical And Surgical History Notes Ear/Nose/Mouth/Throat hx vertigo, hard of hearing Hematologic/Lymphatic MGUS Respiratory hx PNA Gastrointestinal GIST removed 2005 Genitourinary prostate CA stage 4 Oncologic 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, hx non-hodgkins lymphoma, basal cell skin cancer removed Psychiatric fair apetite Review of Systems (ROS) Constitutional Symptoms (General Health) Denies complaints or symptoms of Fatigue, Fever, Chills, Marked Weight Change. Eyes Complains or has symptoms of Glasses / Contacts - reading. Ear/Nose/Mouth/Throat Denies complaints or symptoms of Chronic sinus problems or rhinitis. Respiratory Denies complaints or symptoms of Chronic or frequent coughs, Shortness of Breath. Cardiovascular Denies complaints or symptoms of Chest pain. Gastrointestinal Complains or has symptoms of Frequent diarrhea. Denies complaints or symptoms of Nausea, Vomiting. Genitourinary Denies complaints or symptoms of Frequent urination. Integumentary (Skin) Complains or has symptoms of Wounds - right great toe. Musculoskeletal Complains or has symptoms of Muscle Weakness. Denies complaints or symptoms of Muscle Pain. Neurologic Complains or has symptoms of Numbness/parasthesias. Psychiatric Denies complaints or symptoms of Claustrophobia, Suicidal. Objective Constitutional Sitting or standing Blood Pressure is within target range for patient.. Pulse regular and within target range for patient.Marland Kitchen Respirations regular, non-labored and within target range.. Temperature is normal and within the target range for the patient.Marland Kitchen Appears in no distress. Vitals Time Taken: 9:28 AM, Height: 70 in, Source: Stated, Weight: 150 lbs, Source: Stated, BMI: 21.5, Temperature: 97.8 F, Pulse: 76 bpm,  Respiratory Rate: 18 breaths/min, Blood Pressure: 142/80 mmHg, Capillary Blood Glucose: 93 mg/dl. General Notes: glucose per pt report this am Ears, Nose, Mouth, and Throat Patient is hard of hearing.Marland Kitchen Respiratory work of breathing is normal. Cardiovascular Popliteal and femoral pulses palpable on the right. It will pulses are palpable on the right. Lymphatic None palpable in the right popliteal or inguinal area. Neurological Diabetic insensate neuropathy. Psychiatric appears at normal baseline. General Notes: Wound exam; the wound was in a particularly awkward spot at the tip of the right great toe under the thick mycotic nail. And some points of this the nail was pointing down into the wound bed. Using rongeurs I removed the overlying nail. Fortunately he is insensate and he did not need a nerve block. I was able to identify normal looking skin at some point which was gratifying. I then removed thick skin and subcutaneous tissue from the other end of the wound margin away from the nail to fully identify the wound. Hemostasis with silver nitrate Integumentary (Hair, Skin) Wound #1 status is Open. Original  cause of wound was Gradually Appeared. The wound is located on the Right Toe Great. The wound measures 0.1cm length x 0.8cm width x 0.1cm depth; 0.063cm^2 area and 0.006cm^3 volume. There is Fat Layer (Subcutaneous Tissue) Exposed exposed. There is no tunneling or undermining noted. There is a small amount of serosanguineous drainage noted. The wound margin is thickened. There is large (67-100%) red granulation within the wound bed. There is no necrotic tissue within the wound bed. Assessment Active Problems ICD-10 Type 2 diabetes mellitus with foot ulcer Non-pressure chronic ulcer of other part of right foot limited to breakdown of skin Type 2 diabetes mellitus with diabetic polyneuropathy Type 2 diabetes mellitus with diabetic peripheral angiopathy without gangrene Tinea  unguium Procedures Wound #1 Pre-procedure diagnosis of Wound #1 is a Diabetic Wound/Ulcer of the Lower Extremity located on the Right Toe Great .Severity of Tissue Pre Debridement is: Fat layer exposed. There was a Excisional Skin/Subcutaneous Tissue Debridement with a total area of 0.08 sq cm performed by Ricard Dillon., MD. With the following instrument(s): Curette, and Nippers to remove Viable and Non-Viable tissue/material. Material removed includes Subcutaneous Tissue, Skin: Dermis, Skin: Epidermis, and Other: nail after achieving pain control using Lidocaine 5% topical ointment. No specimens were taken. A time out was conducted at 10:24, prior to the start of the procedure. A Moderate amount of bleeding was controlled with Silver Nitrate. The procedure was tolerated well with a pain level of 0 throughout and a pain level of 0 following the procedure. Post Debridement Measurements: 0.6cm length x 1.6cm width x 0.1cm depth; 0.075cm^3 volume. Character of Wound/Ulcer Post Debridement is improved. Severity of Tissue Post Debridement is: Fat layer exposed. Post procedure Diagnosis Wound #1: Same as Pre-Procedure Plan Follow-up Appointments: Return Appointment in 1 week. Dressing Change Frequency: Change dressing every day. Wound Cleansing: Clean wound with Normal Saline. May shower and wash wound with soap and water. Primary Wound Dressing: Calcium Alginate with Silver Secondary Dressing: Dry Gauze - secure with tape Off-Loading: Other: - SURGICAL SANDLE TO RIGHT FOOT Home Health: [facility] skilled nursing for wound care. - [facility] 1. Primary dressing silver alginate 2. He is to wash this off with soap and water daily and apply the silver alginate with a gauze dressing 3. In order to avoid pressure even possibly on the tips of his diabetic shoes I did give him and given him a surgical shoe 4. The mycotic nail was growing into the wound in 2 locations I had to  remove this. Fortunately I was able to identify healthy tissue and fully defined the wound margins. I am hopeful that this will epithelialize very quickly. 5. The patient has a history of type 2 diabetes with PAD but based on clinical exams and the angiogram he had in 2019 I am not thinking that his blood flow will be a rate limiting step in healing. 6. No evidence of infection no antibiotics were necessary no cultures were done I spent 45 minutes in the research of this patient's record, physical examination and preparation of this record Electronic Signature(s) Signed: 05/02/2019 6:16:53 PM By: Linton Ham MD Entered By: Linton Ham on 05/02/2019 10:55:50 -------------------------------------------------------------------------------- HxROS Details Patient Name: Date of Service: [name] 05/02/2019 9:00 AM Medical Record [id number] Patient Account Number: [id number] Date of Birth/Sex: Treating RN: 20-Oct-1923 (86 y.o. Male) Baruch Gouty Primary Care Provider: Benny Lennert Other Clinician: Referring Provider: Treating Provider/Extender:Raylen Tangonan, Shayne Alken, LISA Weeks in Treatment: 0 Information Obtained From Patient Constitutional Symptoms ([place])  Complaints and Symptoms: Negative for: Fatigue; Fever; Chills; Marked Weight Change Eyes Complaints and Symptoms: Positive for: Glasses / Contacts - reading Medical History: Positive for: Cataracts - bil removed Negative for: Glaucoma; Optic Neuritis Ear/Nose/Mouth/Throat Complaints and Symptoms: Negative for: Chronic sinus problems or rhinitis Medical History: Past Medical History Notes: hx vertigo, hard of hearing Respiratory Complaints and Symptoms: Negative for: Chronic or frequent coughs; Shortness of Breath Medical History: Past Medical History Notes: hx PNA Cardiovascular Complaints and Symptoms: Negative for: Chest pain Medical History: Positive for: Arrhythmia - afib; Hypertension;  Peripheral Arterial Disease Gastrointestinal Complaints and Symptoms: Positive for: Frequent diarrhea Negative for: Nausea; Vomiting Medical History: Past Medical History Notes: GIST removed 2005 Genitourinary Complaints and Symptoms: Negative for: Frequent urination Medical History: Negative for: End Stage Renal Disease Past Medical History Notes: prostate CA stage 4 Integumentary (Skin) Complaints and Symptoms: Positive for: Wounds - right great toe Medical History: Negative for: History of Burn Musculoskeletal Complaints and Symptoms: Positive for: Muscle Weakness Negative for: Muscle Pain Medical History: Positive for: Osteomyelitis - left great toe Negative for: Gout; Rheumatoid Arthritis; Osteoarthritis Neurologic Complaints and Symptoms: Positive for: Numbness/parasthesias Medical History: Positive for: Dementia; Neuropathy Negative for: Quadriplegia; Paraplegia; Seizure Disorder Psychiatric Complaints and Symptoms: Negative for: Claustrophobia; Suicidal Medical History: Negative for: Anorexia/bulimia; Confinement Anxiety Past Medical History Notes: fair apetite Hematologic/Lymphatic Medical History: Past Medical History Notes: MGUS Endocrine Medical History: Positive for: Type II Diabetes Negative for: Type I Diabetes Time with diabetes: decades Treated with: Oral agents Blood sugar tested every day: Yes Tested : daily Immunological Oncologic Medical History: Positive for: Received Chemotherapy Negative for: Received Radiation Past Medical History Notes: 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, hx non-hodgkins lymphoma, basal cell skin cancer removed HBO Extended History Items Eyes: Cataracts Immunizations Pneumococcal Vaccine: Received Pneumococcal Vaccination: Yes Implantable Devices None Hospitalization / Surgery History Type of Hospitalization/Surgery left great and 2nd toe amputation bil cataract removed cholecystectomy colon  resection cycstoscopy esophageal dilitation IandD perirectal abscess left neck lymph node resection TURP Family and Social History Cancer: Yes - Siblings,Child; Diabetes: Yes - Siblings; Heart Disease: Yes - Mother; Hereditary Spherocytosis: No; Hypertension: Yes - Child; Kidney Disease: No; Lung Disease: No; Seizures: No; Stroke: No; Thyroid Problems: Yes - Siblings; Tuberculosis: No; Never smoker; Marital Status - Married; Alcohol Use: Never; Drug Use: No History; Caffeine Use: Daily - coffee; Financial Concerns: No; Food, Clothing or Shelter Needs: No; Support System Lacking: No; Transportation Concerns: No Engineer, maintenance) Signed: 05/02/2019 6:12:59 PM By: Baruch Gouty RN, BSN Signed: 05/02/2019 6:16:53 PM By: Linton Ham MD Entered By: Baruch Gouty on 05/02/2019 09:53:56 -------------------------------------------------------------------------------- [place] Details Patient Name: Date of Service: [name] 05/02/2019 Medical Record [id number] Patient Account Number: [id number] Date of Birth/Sex: Treating RN: Sep 07, 1923 (86 y.o. 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 polyneuropathy E11.51 Type 2 diabetes mellitus with diabetic peripheral angiopathy without gangrene B35.1 Tinea unguium Facility Procedures CPT4 Code Description: [id number] - WOUND CARE VISIT-LEV 3 EST PT Modifier: 25 Quantity: 1 CPT4 Code Description: [id number] - DEB SUBQ TISSUE 20 SQ CM/< ICD-10 Diagnosis Description L97.511 Non-pressure chronic ulcer of other part of right foot limit Modifier: ed to  breakdow Quantity: 1 n of skin  Physician Procedures CPT4 Code Description: [id number] - WC PHYS LEVEL 4 - NEW PT ICD-10 Diagnosis Description L97.511 Non-pressure chronic ulcer of other part of right foot l E11.621 Type 2 diabetes mellitus with foot ulcer E11.51 Type 2 diabetes mellitus with diabetic  peripheral angiop B35.1 Tinea unguium Modifier: 25 imited to breakd athy without gan Quantity: 1 own of skin grene CPT4 Code Description: [id number] [place] - WC PHYS SUBQ TISS 20 SQ CM ICD-10 Diagnosis Description L97.511 Non-pressure chronic ulcer of other part of right foot limi Modifier: ted to breakdown Quantity: 1 of skin Electronic Signature(s) Signed: 05/02/2019 6:16:53 PM By: Linton Ham MD Signed: 05/02/2019 6:18:05 PM By: Carlene Coria RN Entered By: Carlene Coria on 05/02/2019 11:15:19

## 2019-05-02 NOTE — Progress Notes (Signed)
[name], [name] ([id number]) Visit Report for 05/02/2019 Abuse/Suicide Risk Screen Details Patient Name: Date of Service: [name], [name] 05/02/2019 9:00 AM Medical Record [id number] Patient Account Number: [id number] Date of Birth/Sex: Treating RN: 03/09/1924 (86 y.o. Male) Baruch Gouty Primary Care Booker Bhatnagar: Benny Lennert Other Clinician: Referring Dali Kraner: Treating Alexx Mcburney/Extender:Robson, Shayne Alken, LISA Weeks in Treatment: 0 Abuse/Suicide Risk Screen Items Answer ABUSE RISK SCREEN: Has anyone close to you tried to hurt or harm you recentlyo No Do you feel uncomfortable with anyone in your familyo No Has anyone forced you do things that you didnt want to doo No Electronic Signature(s) Signed: 05/02/2019 6:12:59 PM By: Baruch Gouty RN, BSN Entered By: Baruch Gouty on 05/02/2019 09:54:08 -------------------------------------------------------------------------------- Activities of Daily Living Details Patient Name: Date of Service: [name], [name] 05/02/2019 9:00 AM Medical Record [id number] Patient Account Number: [id number] Date of Birth/Sex: Treating RN: 02-11-1924 (86 y.o. Male) Baruch Gouty Primary Care Dawud Mays: Benny Lennert Other Clinician: Referring Massai Hankerson: Treating Gorman Safi/Extender:Robson, Shayne Alken, LISA Weeks in Treatment: 0 Activities of Daily Living Items Answer Activities of Daily Living (Please select one for each item) Drive Automobile Not Able Take Medications Need Assistance Use Telephone Completely Able Care for Appearance Need Assistance Use Toilet Completely Able Bath / Shower Need Assistance Dress Self Need Assistance Feed Self Completely Able Walk Need Assistance Get In / Out Bed Need Assistance Housework Need Assistance Prepare Meals Need Assistance Handle Money Need Assistance Shop for Self Need Assistance Electronic Signature(s) Signed: 05/02/2019 6:12:59 PM By: Baruch Gouty RN, BSN Entered By:  Baruch Gouty on 05/02/2019 09:55:33 -------------------------------------------------------------------------------- Education Screening Details Patient Name: Date of Service: [name] 05/02/2019 9:00 AM Medical Record [id number] Patient Account Number: [id number] Date of Birth/Sex: Treating RN: 15-Dec-1923 (86 y.o. Male) Baruch Gouty Primary Care Vinicius Brockman: Benny Lennert Other Clinician: Referring Sade Hollon: Treating Neely Cecena/Extender:Robson, Shayne Alken, LISA Weeks in Treatment: 0 Primary Learner Assessed: Patient Learning Preferences/Education Level/Primary Language Learning Preference: Explanation, Demonstration, Printed Material Highest Education Level: High School Preferred Language: English Cognitive Barrier Language Barrier: No Translator Needed: No Memory Deficit: No Emotional Barrier: No Cultural/Religious Beliefs Affecting Medical Care: No Physical Barrier Impaired Vision: Yes Glasses Impaired Hearing: Yes hard of hearing Knowledge/Comprehension Knowledge Level: High Comprehension Level: High Ability to understand written High instructions: Ability to understand verbal High instructions: Motivation Anxiety Level: Calm Cooperation: Cooperative Education Importance: Acknowledges Need Interest in Health Problems: Asks Questions Perception: Coherent Willingness to Engage in Self- High Management Activities: Readiness to Engage in Self- High Management Activities: Electronic Signature(s) Signed: 05/02/2019 6:12:59 PM By: Baruch Gouty RN, BSN Entered By: Baruch Gouty on 05/02/2019 09:56:29 -------------------------------------------------------------------------------- Fall Risk Assessment Details Patient Name: Date of Service: [name] 05/02/2019 9:00 AM Medical Record [id number] Patient Account Number: [id number] Date of Birth/Sex: Treating RN: [birth year] (86 y.o. Male) Baruch Gouty Primary Care Huie Ghuman: Benny Lennert Other Clinician: Referring Andreas Sobolewski: Treating Evadne Ose/Extender:Robson, Shayne Alken, LISA Weeks in Treatment: 0 Fall Risk Assessment Items Have you had 2 or more falls in the last 12 monthso 0 No Have you had any fall that resulted in injury in the last 12 monthso 0 No FALLS RISK SCREEN History of falling - immediate or within 3 months 0 No Secondary diagnosis (Do you have 2 or more medical diagnoseso) 0 No Ambulatory aid None/bed rest/wheelchair/nurse 0 No Crutches/cane/walker 15 Yes Furniture 0 No Intravenous therapy Access/Saline/Heparin Lock 0 No Weak (short steps with or without shuffle, stooped but able to lift head 10 Yes while walking, may seek support from furniture) Impaired (short  steps with shuffle, may have difficulty arising from chair, 0 No head down, impaired balance) Mental Status Oriented to own ability 0 Yes Overestimates or forgets limitations 0 No Risk Level: Medium Risk Score: 25 Electronic Signature(s) Signed: 05/02/2019 6:12:59 PM By: Baruch Gouty RN, BSN Entered By: Baruch Gouty on 05/02/2019 09:57:09 -------------------------------------------------------------------------------- Foot Assessment Details Patient Name: Date of Service: [name] 05/02/2019 9:00 AM Medical Record [id number] Patient Account Number: [id number] Date of Birth/Sex: Treating RN: Apr 21, 1923 (86 y.o. Male) Baruch Gouty Primary Care Kebra Lowrimore: Benny Lennert Other Clinician: Referring Marialy Urbanczyk: Treating Taylon Louison/Extender:Robson, Shayne Alken, LISA Weeks in Treatment: 0 Foot Assessment Items Site Locations + = Sensation present, - = Sensation absent, C = Callus, U = Ulcer R = Redness, W = Warmth, M = Maceration, PU = Pre-ulcerative lesion F = Fissure, S = Swelling, D = Dryness Assessment Right: Left: Other Deformity: No No Prior Foot Ulcer: No Yes Prior Amputation: No Yes Charcot Joint: No No Ambulatory Status: Ambulatory With Help Assistance  Device: Walker Gait: Administrator, arts) Signed: 05/02/2019 6:12:59 PM By: Baruch Gouty RN, BSN Entered By: Baruch Gouty on 05/02/2019 09:59:50 -------------------------------------------------------------------------------- Nutrition Risk Screening Details Patient Name: Date of Service: [name], [name] 05/02/2019 9:00 AM Medical Record [id number] Patient Account Number: [id number] Date of Birth/Sex: Treating RN: 1924-02-07 (86 y.o. Male) Baruch Gouty Primary Care Finnigan Warriner: Benny Lennert Other Clinician: Referring Findley Vi: Treating Tymesha Ditmore/Extender:Robson, Shayne Alken, LISA Weeks in Treatment: 0 Height (in): 70 Weight (lbs): 150 Body Mass Index (BMI): 21.5 Nutrition Risk Screening Items Score Screening NUTRITION RISK SCREEN: I have an illness or condition that made me change the kind and/or 2 Yes amount of food I eat I eat fewer than two meals per day 3 Yes I eat few fruits and vegetables, or milk products 0 No I have three or more drinks of beer, liquor or wine almost every day 0 No I have tooth or mouth problems that make it hard for me to eat 0 No I don't always have enough money to buy the food I need 0 No I eat alone most of the time 0 No I take three or more different prescribed or over-the-counter drugs a day 1 Yes 0 No Without wanting to, I have lost or gained 10 pounds in the last six months I am not always physically able to shop, cook and/or feed myself 2 Yes Nutrition Protocols Good Risk Protocol Moderate Risk Protocol Provide education on elevated blood sugars and High Risk Proctocol 0 impact on wound healing, as applicable Risk Level: High Risk Score: 8 Electronic Signature(s) Signed: 05/02/2019 6:12:59 PM By: Baruch Gouty RN, BSN Entered By: Baruch Gouty on 05/02/2019 09:57:51

## 2019-05-03 DIAGNOSIS — R269 Unspecified abnormalities of gait and mobility: Secondary | ICD-10-CM | POA: Diagnosis not present

## 2019-05-03 DIAGNOSIS — C8581 Other specified types of non-Hodgkin lymphoma, lymph nodes of head, face, and neck: Secondary | ICD-10-CM | POA: Diagnosis not present

## 2019-05-03 DIAGNOSIS — C61 Malignant neoplasm of prostate: Secondary | ICD-10-CM | POA: Diagnosis not present

## 2019-05-03 DIAGNOSIS — E1165 Type 2 diabetes mellitus with hyperglycemia: Secondary | ICD-10-CM | POA: Diagnosis not present

## 2019-05-03 DIAGNOSIS — I10 Essential (primary) hypertension: Secondary | ICD-10-CM | POA: Diagnosis not present

## 2019-05-03 DIAGNOSIS — L97519 Non-pressure chronic ulcer of other part of right foot with unspecified severity: Secondary | ICD-10-CM | POA: Diagnosis not present

## 2019-05-05 NOTE — Progress Notes (Signed)
[name] ([id number]) Visit Report for 05/02/2019 Allergy List Details Patient Name: Date of Service: [name], [name] 05/02/2019 9:00 AM Medical Record [id number] Patient Account Number: [id number] Date of Birth/Sex: Treating RN: 20-Jan-1924 (86 y.o. [name] Primary Care Josejulian Tarango: Benny Lennert Other Clinician: Referring Aariya Ferrick: Treating Calub Tarnow/Extender:Robson, Shayne Alken, LISA Weeks in Treatment: 0 Allergies Active Allergies Augmentin Reaction: rash Severity: Severe Allergy Notes Electronic Signature(s) Signed: 05/02/2019 6:12:59 PM By: Baruch Gouty RN, BSN Entered By: Baruch Gouty on 05/02/2019 09:36:57 -------------------------------------------------------------------------------- Arrival Information Details Patient Name: Date of Service: [name] 05/02/2019 9:00 AM Medical Record [id number] Patient Account Number: [id number] Date of Birth/Sex: Treating RN: [birth year] (86 y.o. [name] Primary Care Albie Bazin: Benny Lennert Other Clinician: Referring Kissie Ziolkowski: Treating Lizandra Zakrzewski/Extender:Robson, Shayne Alken, LISA Weeks in Treatment: 0 Visit Information Patient Arrived: Wheel Chair Arrival Time: 09:18 Accompanied By: daughter Transfer Assistance: None Patient Identification Verified: Yes Secondary Verification Process Completed: Yes Patient Requires Transmission-Based No Precautions: Patient Has Alerts: No Electronic Signature(s) Signed: 05/02/2019 6:12:59 PM By: Baruch Gouty RN, BSN Entered By: Baruch Gouty on 05/02/2019 09:26:10 -------------------------------------------------------------------------------- Clinic Level of Care Assessment Details Patient Name: Date of Service: [name], [name] 05/02/2019 9:00 AM Medical Record [id number] Patient Account Number: [id number] Date of Birth/Sex: Treating RN: 07/19/1923 (86 y.o. [name]) Carlene Coria Primary Care Lenoard Helbert: Benny Lennert Other  Clinician: Referring Lei Dower: Treating Mehdi Gironda/Extender:Robson, Shayne Alken, LISA Weeks in Treatment: 0 Clinic Level of Care Assessment Items TOOL 1 Quantity Score X - Use when EandM and Procedure is performed on INITIAL visit 1 0 ASSESSMENTS - Nursing Assessment / Reassessment X - General Physical Exam (combine w/ comprehensive assessment (listed just below) 1 20 when performed on new pt. evals) X - Comprehensive Assessment (HX, ROS, Risk Assessments, Wounds Hx, etc.) 1 25 ASSESSMENTS - Wound and Skin Assessment / Reassessment []  - Dermatologic / Skin Assessment (not related to wound area) 0 ASSESSMENTS - Ostomy and/or Continence Assessment and Care []  - Incontinence Assessment and Management 0 []  - Ostomy Care Assessment and Management (repouching, etc.) 0 PROCESS - Coordination of Care X - Simple Patient / Family Education for ongoing care 1 15 []  - Complex (extensive) Patient / Family Education for ongoing care 0 X - Staff obtains Programmer, systems, Records, Test Results / Process Orders 1 10 []  - Staff telephones HHA, Nursing Homes / Clarify orders / etc 0 []  - Routine Transfer to another Facility (non-emergent condition) 0 X - Routine Hospital Admission (non-emergent condition) 1 10 []  - New Admissions / Biomedical engineer / Ordering NPWT, Apligraf, etc. 0 []  - Emergency Hospital Admission (emergent condition) 0 PROCESS - Special Needs []  - Pediatric / Minor Patient Management 0 []  - Isolation Patient Management 0 []  - Hearing / Language / Visual special needs 0 []  - Assessment of Community assistance (transportation, D/C planning, etc.) 0 []  - Additional assistance / Altered mentation 0 []  - Support Surface(s) Assessment (bed, cushion, seat, etc.) 0 INTERVENTIONS - Miscellaneous []  - External ear exam 0 []  - Patient Transfer (multiple staff / Civil Service fast streamer / Similar devices) 0 []  - Simple Staple / Suture removal (25 or less) 0 []  - Complex Staple / Suture removal (26 or  more) 0 []  - Hypo/Hyperglycemic Management (do not check if billed separately) 0 X - Ankle / Brachial Index (ABI) - do not check if billed separately 1 15 Has the patient been seen at the hospital within the last three years: Yes Total Score: 95 Level Of Care: New/Established - Level 3 Electronic  Signature(s) Signed: 05/02/2019 6:18:05 PM By: Carlene Coria RN Entered By: Carlene Coria on 05/02/2019 10:35:10 -------------------------------------------------------------------------------- Encounter Discharge Information Details Patient Name: Date of Service: JAHFARI, JOYNER 05/02/2019 9:00 AM Medical Record [id number] Patient Account Number: [id number] Date of Birth/Sex: Treating RN: [birth year] (86 y.o. [name] Primary Care Bethanee Redondo: Benny Lennert Other Clinician: Referring Leilana Mcquire: Treating Macen Joslin/Extender:Robson, Shayne Alken, LISA Weeks in Treatment: 0 Encounter Discharge Information Items Post Procedure Vitals Discharge Condition: Stable Temperature (F): 97.8 Ambulatory Status: Wheelchair Pulse (bpm): 76 Discharge Destination: Home Respiratory Rate (breaths/min): 18 Transportation: Private Auto Blood Pressure (mmHg): 142/80 Accompanied By: daughter Schedule Follow-up Appointment: Yes Clinical Summary of Care: Patient Declined Electronic Signature(s) Signed: 05/05/2019 5:57:08 PM By: [name] Entered By: [name] on 05/02/2019 11:08:16 -------------------------------------------------------------------------------- Lower Extremity Assessment Details Patient Name: Date of Service: [name], [name] 05/02/2019 9:00 AM Medical Record [id number] Patient Account Number: [id number] Date of Birth/Sex: Treating RN: Dec 11, 1923 (86 y.o. [name] Primary Care [name]: Benny Lennert Other Clinician: Referring Taran Hable: Treating Kessler Solly/Extender:Robson, Shayne Alken, LISA Weeks in Treatment: 0 Edema Assessment Assessed: [Left: No]  [Right: No] Edema: [Left: N] [Right: o] Calf Left: Right: Point of Measurement: cm From Medial Instep cm 29.3 cm Ankle Left: Right: Point of Measurement: cm From Medial Instep cm 20.1 cm Vascular Assessment Pulses: Dorsalis Pedis Palpable: [Right:No] Notes right DP and PT pulses noncompressible Electronic Signature(s) Signed: 05/02/2019 6:12:59 PM By: Baruch Gouty RN, BSN Entered By: Baruch Gouty on 05/02/2019 10:04:46 -------------------------------------------------------------------------------- Multi Wound Chart Details Patient Name: Date of Service: [name]. 05/02/2019 9:00 AM Medical Record [id number] Patient Account Number: [id number] Date of Birth/Sex: Treating RN: 12-27-1923 (86 y.o. [name]) Carlene Coria Primary Care Ramell Wacha: Benny Lennert Other Clinician: Referring Sorina Derrig: Treating Ronne Savoia/Extender:Robson, Shayne Alken, LISA Weeks in Treatment: 0 Vital Signs Height(in): 70 Capillary Blood 93 Glucose(mg/dl): Weight(lbs): 150 Pulse(bpm): 11 Body Mass Index(BMI): 22 Blood Pressure(mmHg): 142/80 Temperature(F): 97.8 Respiratory 18 Rate(breaths/min): Photos: [1:No Photos] [N/A:N/A] Wound Location: [1:Right Toe Great] [N/A:N/A] Wounding Event: [1:Gradually Appeared] [N/A:N/A] Primary Etiology: [1:Diabetic Wound/Ulcer of the N/A Lower Extremity] Comorbid History: [1:Cataracts, Arrhythmia, Hypertension, Peripheral Arterial Disease, Type II Diabetes, Osteomyelitis, Dementia, Neuropathy, Received Chemotherapy] [N/A:N/A] Date Acquired: [1:02/28/2019] [N/A:N/A] Weeks of Treatment: [1:0] [N/A:N/A] Wound Status: [1:Open] [N/A:N/A] Measurements L x W x D 0.1x0.8x0.1 [N/A:N/A] (cm) Area (cm) : [1:0.063] [N/A:N/A] Volume (cm) : [1:0.006] [N/A:N/A] Classification: [1:Grade 1] [N/A:N/A] Exudate Amount: [1:Small] [N/A:N/A] Exudate Type: [1:Serosanguineous] [N/A:N/A] Exudate Color: [1:red, brown] [N/A:N/A] Wound Margin: [1:Thickened]  [N/A:N/A] Granulation Amount: [1:Large (67-100%)] [N/A:N/A] Granulation Quality: [1:Red] [N/A:N/A] Necrotic Amount: [1:None Present (0%)] [N/A:N/A] Exposed Structures: [1:Fat Layer (Subcutaneous N/A Tissue) Exposed: Yes Fascia: No Tendon: No Muscle: No Joint: No Bone: No] Epithelialization: [1:Small (1-33%)] [N/A:N/A] Debridement: [1:Debridement - Excisional N/A] Pre-procedure [1:10:24] [N/A:N/A] Verification/Time Out Taken: Pain Control: [1:Lidocaine 5% topical ointment] [N/A:N/A] Tissue Debrided: [1:Subcutaneous] [N/A:N/A] Level: [1:Skin/Subcutaneous Tissue N/A] Debridement Area (sq cm):0.08 [N/A:N/A] Instrument: [1:Curette, Nippers] [N/A:N/A] Bleeding: [1:Moderate] [N/A:N/A] Hemostasis Achieved: [1:Silver Nitrate] [N/A:N/A] Procedural Pain: [1:0] [N/A:N/A] Post Procedural Pain: [1:0] [N/A:N/A] Debridement Treatment Procedure was tolerated [N/A:N/A] Response: [1:well] Post Debridement [1:0.6x1.6x0.1] [N/A:N/A] Measurements L x W x D (cm) Post Debridement [1:0.075] [N/A:N/A] Volume: (cm) Procedures Performed: Debridement [N/A:N/A] Treatment Notes Electronic Signature(s) Signed: 05/02/2019 6:16:53 PM By: Linton Ham MD Signed: 05/02/2019 6:18:05 PM By: Carlene Coria RN Entered By: Linton Ham on 05/02/2019 10:46:05 -------------------------------------------------------------------------------- Multi-Disciplinary Care Plan Details Patient Name: Date of Service: [name], [name] 05/02/2019 9:00 AM Medical Record [id number] Patient Account Number: [id number] Date of Birth/Sex: Treating RN: Apr 26, 1923 (86  y.o. [name] Primary Care Samual Beals: Benny Lennert Other Clinician: Referring Ansley Stanwood: Treating Kennita Pavlovich/Extender:Robson, Shayne Alken, LISA Weeks in Treatment: 0 Active Inactive Wound/Skin Impairment Nursing Diagnoses: Knowledge deficit related to ulceration/compromised skin integrity Goals: Patient/caregiver will verbalize understanding of skin care  regimen Date Initiated: 05/02/2019 Target Resolution Date: 06/02/2019 Goal Status: Active Ulcer/skin breakdown will have a volume reduction of 30% by week 4 Date Initiated: 05/02/2019 Target Resolution Date: 06/02/2019 Goal Status: Active Interventions: Assess patient/caregiver ability to obtain necessary supplies Assess patient/caregiver ability to perform ulcer/skin care regimen upon admission and as needed Assess ulceration(s) every visit Notes: Electronic Signature(s) Signed: 05/02/2019 6:18:05 PM By: Carlene Coria RN Entered By: Carlene Coria on 05/02/2019 10:19:22 -------------------------------------------------------------------------------- Pain Assessment Details Patient Name: Date of Service: [name], [name] 05/02/2019 9:00 AM Medical Record [id number] Patient Account Number: [id number] Date of Birth/Sex: Treating RN: 1923-08-28 (86 y.o. [name] Primary Care Keilani Terrance: Benny Lennert Other Clinician: Referring Kaleem Sartwell: Treating Ettore Trebilcock/Extender:Robson, Shayne Alken, LISA Weeks in Treatment: 0 Active Problems Location of Pain Severity and Description of Pain Patient Has Paino No Site Locations Rate the pain. Current Pain Level: 0 Pain Management and Medication Current Pain Management: Electronic Signature(s) Signed: 05/02/2019 6:12:59 PM By: Baruch Gouty RN, BSN Entered By: Baruch Gouty on 05/02/2019 10:10:27 -------------------------------------------------------------------------------- Patient/Caregiver Education Details Patient Name: Date of Service: [name], [name] E. 1/12/2021andnbsp9:00 AM Medical Record [id number] Patient Account Number: [id number] Date of Birth/Gender: Sep 19, 1923 (86 y.o. M) Treating RN: Carlene Coria Primary Care Physician: Benny Lennert Other Clinician: Referring Physician: Treating Physician/Extender:Robson, Shayne Alken, Mart Piggs in Treatment: 0 Education Assessment Education Provided To: Patient Education  Topics Provided Wound/Skin Impairment: Methods: Explain/Verbal Responses: State content correctly Electronic Signature(s) Signed: 05/02/2019 6:18:05 PM By: Carlene Coria RN Entered By: Carlene Coria on 05/02/2019 10:19:38 -------------------------------------------------------------------------------- Wound Assessment Details Patient Name: Date of Service: [name], [name] 05/02/2019 9:00 AM Medical Record [id number] Patient Account Number: [id number] Date of Birth/Sex: Treating RN: [birth year] (86 y.o. [name] Primary Care Masha Orbach: Benny Lennert Other Clinician: Referring Avani Sensabaugh: Treating Zaveon Gillen/Extender:Robson, Shayne Alken, LISA Weeks in Treatment: 0 Wound Status Wound Number: 1 Primary Diabetic Wound/Ulcer of the Lower Extremity Etiology: Wound Location: Right Toe Great Wound Open Wounding Event: Gradually Appeared Status: Date Acquired: 02/28/2019 Comorbid Cataracts, Arrhythmia, Hypertension, Weeks Of Treatment: 0 History: Peripheral Arterial Disease, Type II Diabetes, Clustered Wound: No Osteomyelitis, Dementia, Neuropathy, Received Chemotherapy Photos Wound Measurements Length: (cm) 0.1 Width: (cm) 0.8 Depth: (cm) 0.1 Area: (cm) 0.063 Volume: (cm) 0.006 Wound Description Classification: Grade 1 Wound Margin: Thickened Exudate Amount: Small Exudate Type: Serosanguineous Exudate Color: red, brown Wound Bed Granulation Amount: Large (67-100%) Granulation Quality: Red Necrotic Amount: None Present (0%) r After Cleansing: No ibrino No Exposed Structure Exposed: No er (Subcutaneous Tissue) Exposed: Yes Exposed: No Exposed: No xposed: No posed: No % Reduction in Area: 0% % Reduction in Volume: 0% Epithelialization: Small (1-33%) Tunneling: No Undermining: No Foul Odo Slough/F Fascia Fat Lay Tendon Muscle Joint E Bone Ex Treatment Notes Wound #1 (Right Toe Great) 1. Cleanse With Wound Cleanser 3. Primary Dressing  Applied Calcium Alginate Ag 4. Secondary Dressing Dry Gauze 7. Footwear/Offloading device applied Surgical shoe Notes toe size stretch net, surgical show placed on. patient and daughter was educated regarding surgical shoe and treatment applied Electronic Signature(s) Signed: 05/03/2019 3:30:11 PM By: Mikeal Hawthorne EMT/HBOT Signed: 05/03/2019 6:34:17 PM By: Baruch Gouty RN, BSN Previous Signature: 05/02/2019 6:12:59 PM Version By: Baruch Gouty RN, BSN Entered By: Mikeal Hawthorne on 05/03/2019 14:39:19 -------------------------------------------------------------------------------- Vitals Details Patient  Name: Date of Service: NISHANTH, [name] 05/02/2019 9:00 AM Medical Record [id number] Patient Account Number: [id number] Date of Birth/Sex: Treating RN: 02-Feb-1924 (86 y.o. [name] Primary Care Aavya Shafer: Benny Lennert Other Clinician: Referring Seymore Brodowski: Treating Daxter Paule/Extender:Robson, Shayne Alken, LISA Weeks in Treatment: 0 Vital Signs Time Taken: 09:28 Temperature (F): 97.8 Height (in): 70 Pulse (bpm): 76 Source: Stated Respiratory Rate (breaths/min): 18 Weight (lbs): 150 Blood Pressure (mmHg): 142/80 Source: Stated Capillary Blood Glucose (mg/dl): 93 Body Mass Index (BMI): 21.5 Reference Range: 80 - 120 mg / dl Notes glucose per pt report this am Electronic Signature(s) Signed: 05/02/2019 6:12:59 PM By: Baruch Gouty RN, BSN Entered By: Baruch Gouty on 05/02/2019 09:29:38

## 2019-05-07 ENCOUNTER — Other Ambulatory Visit: Payer: Self-pay | Admitting: Cardiology

## 2019-05-07 ENCOUNTER — Other Ambulatory Visit (HOSPITAL_COMMUNITY): Payer: Self-pay | Admitting: Nurse Practitioner

## 2019-05-09 ENCOUNTER — Encounter (HOSPITAL_BASED_OUTPATIENT_CLINIC_OR_DEPARTMENT_OTHER): Payer: Medicare Other | Attending: Internal Medicine | Admitting: Internal Medicine

## 2019-05-09 ENCOUNTER — Other Ambulatory Visit: Payer: Self-pay

## 2019-05-09 DIAGNOSIS — E11621 Type 2 diabetes mellitus with foot ulcer: Secondary | ICD-10-CM | POA: Diagnosis not present

## 2019-05-09 DIAGNOSIS — I10 Essential (primary) hypertension: Secondary | ICD-10-CM | POA: Diagnosis not present

## 2019-05-09 DIAGNOSIS — E1151 Type 2 diabetes mellitus with diabetic peripheral angiopathy without gangrene: Secondary | ICD-10-CM | POA: Diagnosis not present

## 2019-05-09 DIAGNOSIS — E1142 Type 2 diabetes mellitus with diabetic polyneuropathy: Secondary | ICD-10-CM | POA: Diagnosis not present

## 2019-05-09 DIAGNOSIS — L97511 Non-pressure chronic ulcer of other part of right foot limited to breakdown of skin: Secondary | ICD-10-CM | POA: Diagnosis not present

## 2019-05-09 DIAGNOSIS — L97512 Non-pressure chronic ulcer of other part of right foot with fat layer exposed: Secondary | ICD-10-CM | POA: Diagnosis not present

## 2019-05-09 DIAGNOSIS — B351 Tinea unguium: Secondary | ICD-10-CM | POA: Diagnosis not present

## 2019-05-09 IMAGING — MR MR HEAD W/O CM
7 series · 48 of 48 positions shown · non-contrast
Comparison: CTA head neck from yesterday

CLINICAL DATA: Altered mental status for 2 days

EXAM:
MRI HEAD WITHOUT CONTRAST
TECHNIQUE: Multiplanar, multiecho pulse sequences of the brain and surrounding
structures were obtained without intravenous contrast.

[Series 2: [id number] · sagittal · 5.0mm · 0.42mm/px · 4 of 20 slices shown]
[im 1/20]
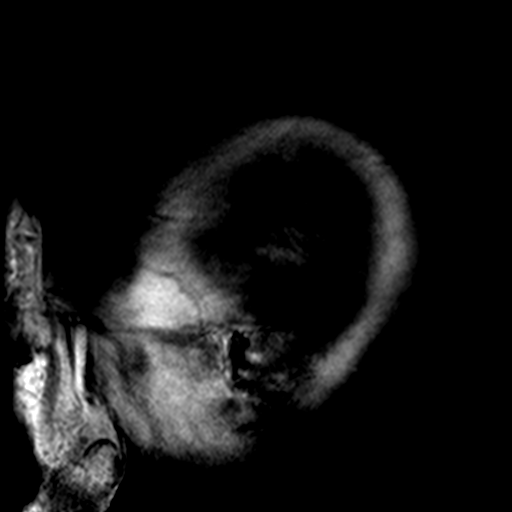
[im 7/20]
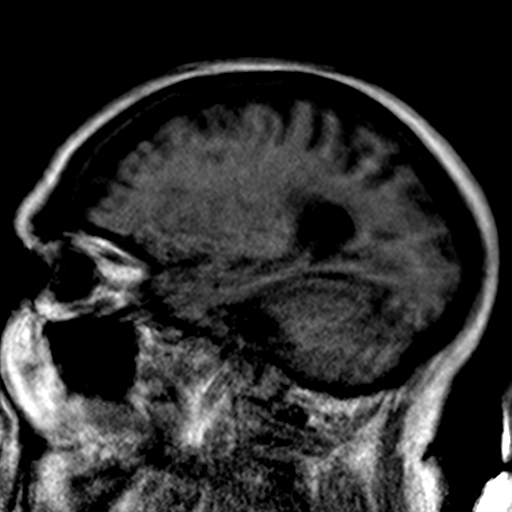
[im 13/20]
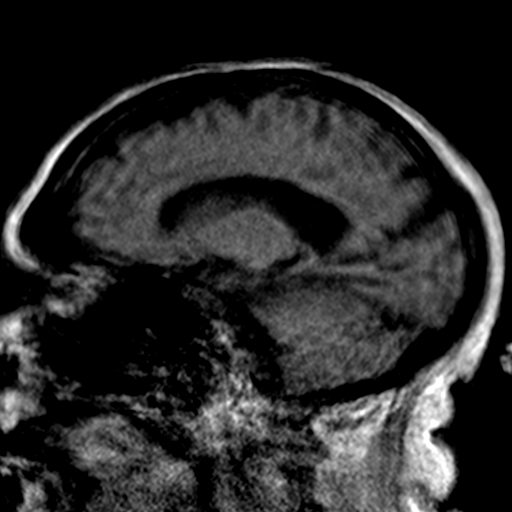
[im 20/20]
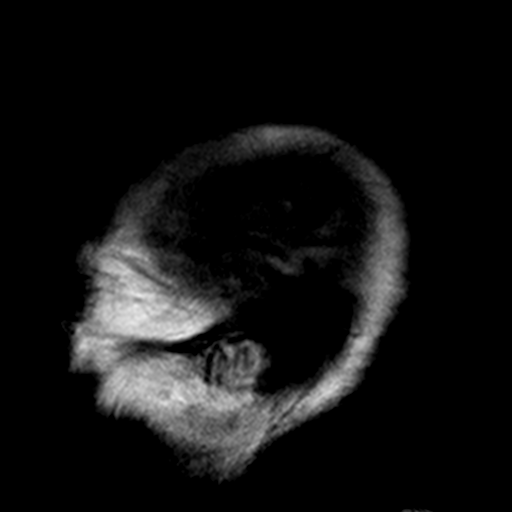

[Series 3: DWI · axial · 3.0mm · 0.65mm/px · z∈[-75,+84]mm · 8 of 54 slices shown (1 of 6)]
[im 1/54]
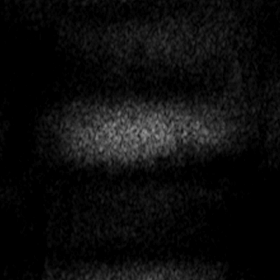
[im 8/54]
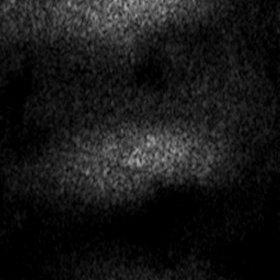
[im 16/54]
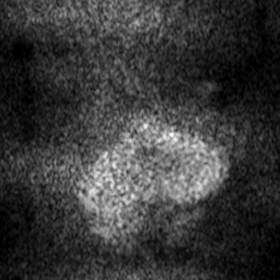
[im 23/54]
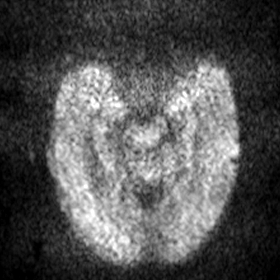
[im 31/54]
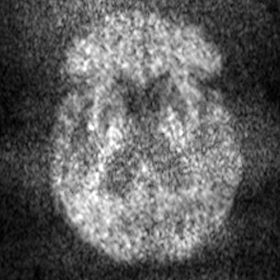
[im 38/54]
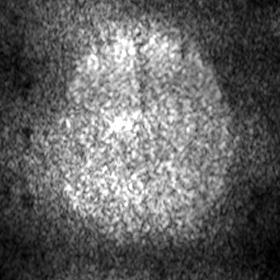
[im 46/54]
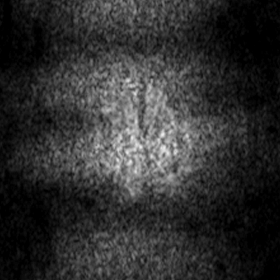
[im 54/54]
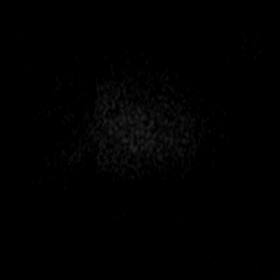

[Series 4: DWI · axial · 3.0mm · 0.75mm/px · z∈[-75,+87]mm · 9 of 55 slices shown (2 of 6)]
[im 1/55]
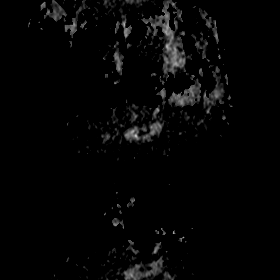
[im 7/55]
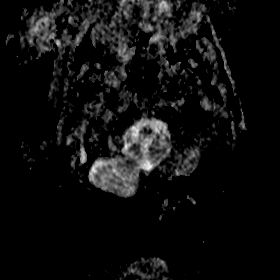
[im 14/55]
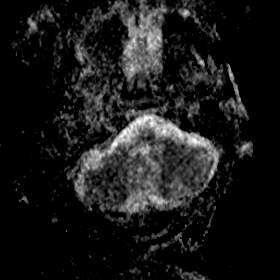
[im 21/55]
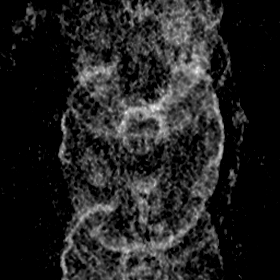
[im 28/55]
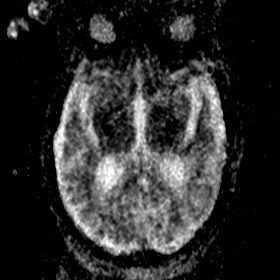
[im 34/55]
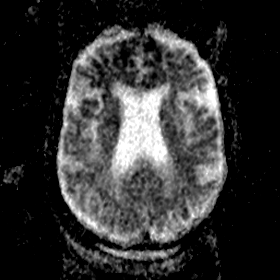
[im 41/55]
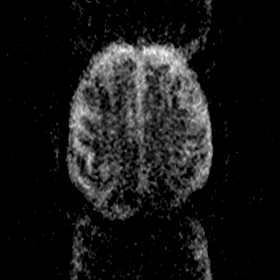
[im 48/55]
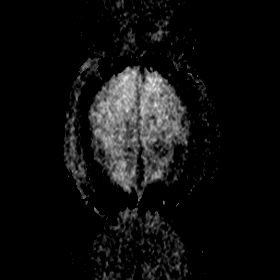
[im 55/55]
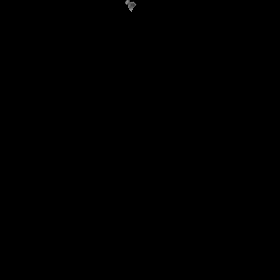

[Series 5: DWI · coronal · 5.0mm · 0.48mm/px · 5 of 32 slices shown (3 of 6)]
[im 1/32]
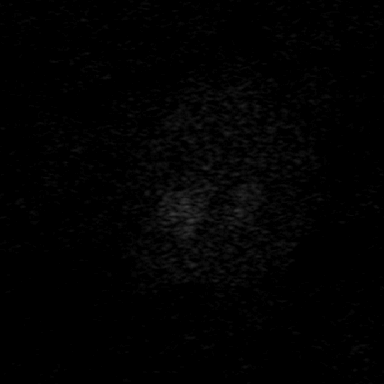
[im 8/32]
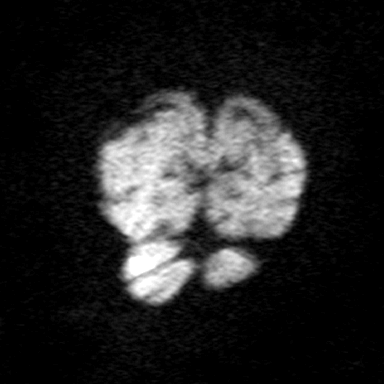
[im 16/32]
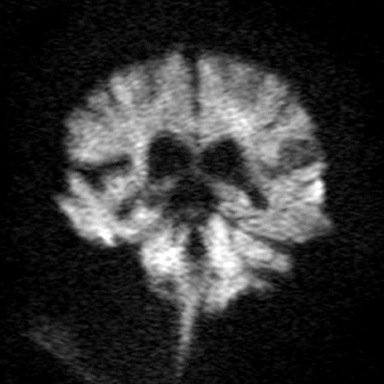
[im 24/32]
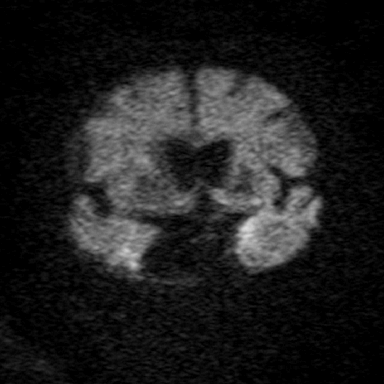
[im 32/32]
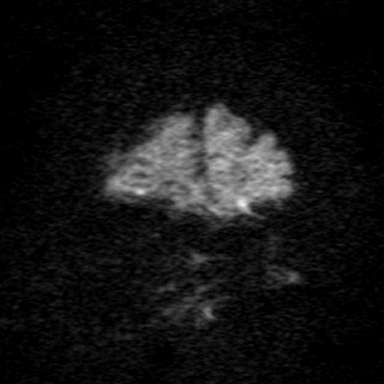

[Series 6: DWI · coronal · 5.0mm · 0.48mm/px · 5 of 34 slices shown (4 of 6)]
[im 1/34]
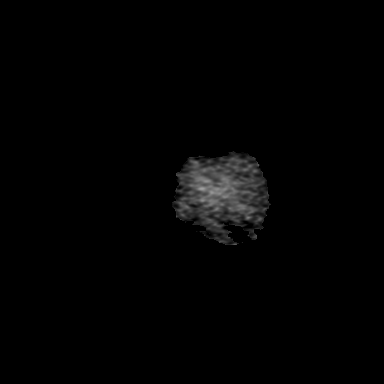
[im 9/34]
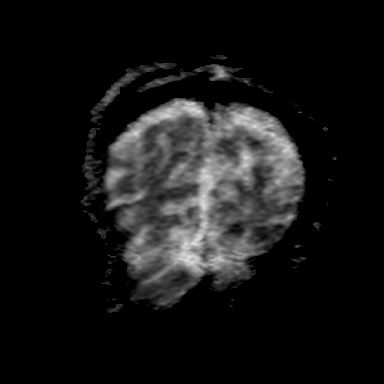
[im 17/34]
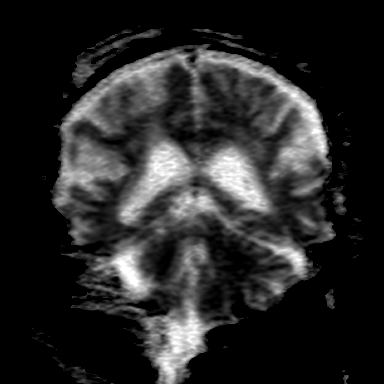
[im 25/34]
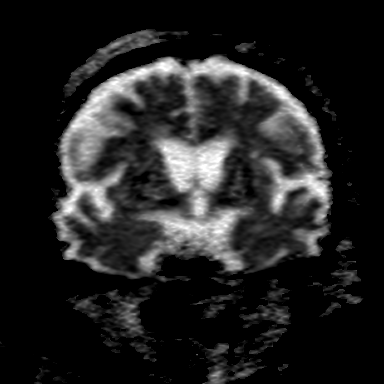
[im 34/34]
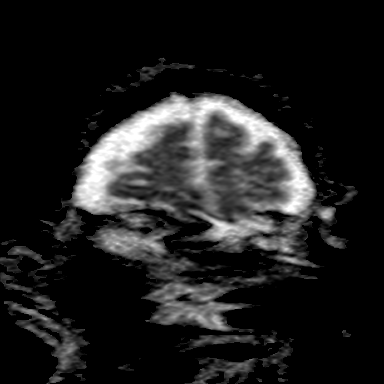

[Series 7: DWI · axial · 3.0mm · 0.82mm/px · z∈[-75,+87]mm · 9 of 56 slices shown (5 of 6)]
[im 1/56]
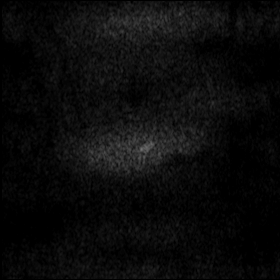
[im 7/56]
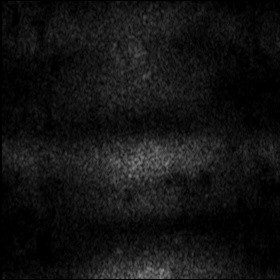
[im 14/56]
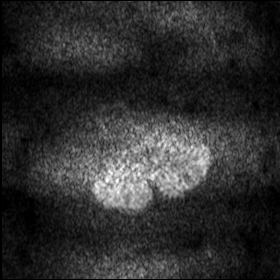
[im 21/56]
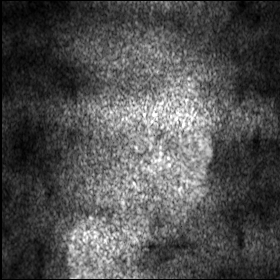
[im 28/56]
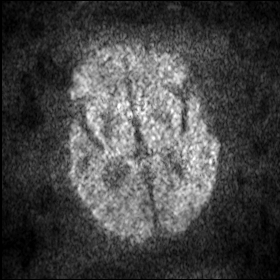
[im 35/56]
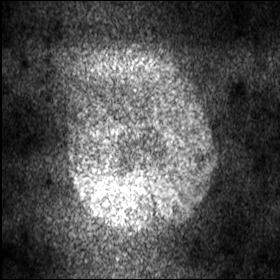
[im 42/56]
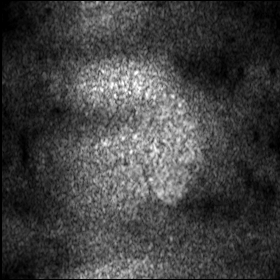
[im 49/56]
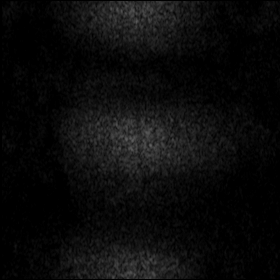
[im 56/56]
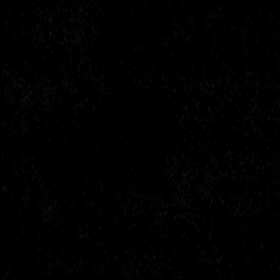

[Series 8: DWI · axial · 3.0mm · 0.75mm/px · z∈[-75,+84]mm · 8 of 54 slices shown (6 of 6)]
[im 1/54]
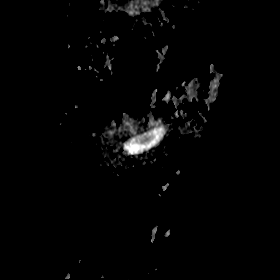
[im 8/54]
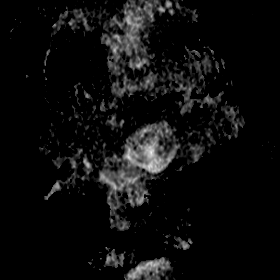
[im 16/54]
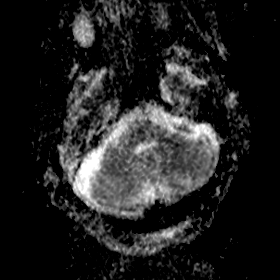
[im 23/54]
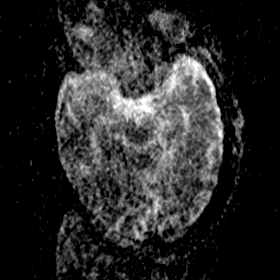
[im 31/54]
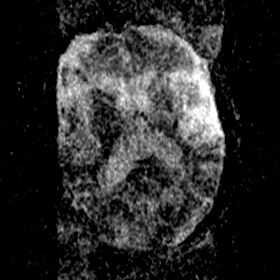
[im 38/54]
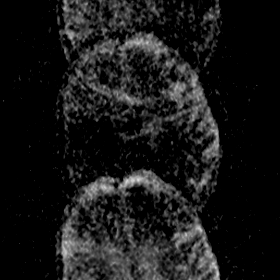
[im 46/54]
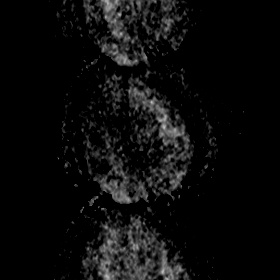
[im 54/54]
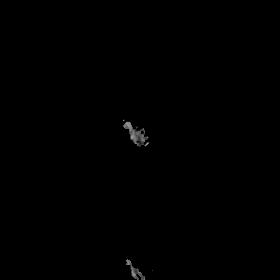

[48 of 48 positions shown; findings below may reference images not displayed]

FINDINGS: Significantly motion degraded study. Only diffusion and sagittal T1
weighted imaging could be acquired. There is no evidence of infarct,
hydrocephalus, or shift. Generalized atrophy.
IMPRESSION: 1. Significantly motion degraded and truncated study. No acute
finding.
2. Generalized atrophy.

## 2019-05-10 NOTE — Progress Notes (Signed)
MERCY, PANGAN ([id number]) Visit Report for 05/09/2019 Debridement Details Patient Name: Date of Service: [name], [name] 05/09/2019 10:00 AM Medical Record [id number] Patient Account Number: [id number] Date of Birth/Sex: Jul 17, 1923 (86 y.o. M) Treating RN: Primary Care Provider: Benny Lennert Other Clinician: Referring Provider: Treating Provider/Extender:Remell Giaimo, Shayne Alken, LISA Weeks in Treatment: 1 Debridement Performed for Wound #1 Right Toe Great Assessment: Performed By: Physician Ricard Dillon., MD Debridement Type: Debridement Severity of Tissue Pre Fat layer exposed Debridement: Level of Consciousness (Pre- Awake and Alert procedure): Pre-procedure Verification/Time Out Taken: Yes - 10:40 Start Time: 10:40 Pain Control: Lidocaine 5% topical ointment Total Area Debrided (L x W): 0.5 (cm) x 0.6 (cm) = 0.3 (cm) Tissue and other material Viable, Non-Viable, Eschar, Slough, Subcutaneous, Slough debrided: Level: Skin/Subcutaneous Tissue Debridement Description: Excisional Instrument: Curette Bleeding: Moderate Hemostasis Achieved: Pressure End Time: 10:44 Procedural Pain: 0 Post Procedural Pain: 0 Response to Treatment: Procedure was tolerated well Level of Consciousness Awake and Alert (Post-procedure): Post Debridement Measurements of Total Wound Length: (cm) 0.5 Width: (cm) 0.6 Depth: (cm) 0.1 Volume: (cm) 0.024 Character of Wound/Ulcer Post Improved Debridement: Severity of Tissue Post Debridement: Fat layer exposed Post Procedure Diagnosis Same as Pre-procedure Electronic Signature(s) Signed: 05/09/2019 5:52:07 PM By: Linton Ham MD Entered By: Linton Ham on 05/09/2019 10:50:54 -------------------------------------------------------------------------------- HPI Details Patient Name: Date of Service: [name] 05/09/2019 10:00 AM Medical Record [id number] Patient Account Number: [id number] Date of Birth/Sex: Treating  RN: 14-Dec-1923 (86 y.o. M) Primary Care Provider: Benny Lennert Other Clinician: Referring Provider: Treating Provider/Extender:Inza Mikrut, Shayne Alken, LISA Weeks in Treatment: 1 History of Present Illness HPI Description: ADMISSION 05/02/2019 This is a 86 year old man accompanied by his daughter. He lives in [place] with his elderly wife. He has home caregivers as well. Apparently on February 28, 2019 they noted blood on his sock and noticed a wound on the tip of his right great toe. He has been cared for by Dr. Posey Pronto who is his podiatrist in [place]. Apparently they have been using Betadine to this area. The patient has a thick mycotic nail on the right as well which is embedded in the wound in some areas. Fortunately the patient has diabetic neuropathy and is not really experiencing any pain otherwise I think this would all be quite painful. The patient was previously treated in 2019 for wounds on the left foot. This included a fairly thorough vascular review by vein and vascular Dr. Oneida Alar. His ABIs at that time were noncompressible bilaterally TBI on the right was 0.88 with biphasic waveforms on the right and monophasic waveforms at the left. He ended up having amputations of the left first and second toes. He did have an angiogram on 06/25/2017 which showed on the right the right common femoral profundofemoral for Morris and superficial femoral arteries were widely patent the right popliteal was widely patent. Tibial vessels were not well opacified secondary to motion artifact however he was felt to have all 3 tibial vessels intact with some calcification and some areas of stenosis within the posterior tibial artery but relatively good flow to the right lower leg. He also had the left reviewed as well which was the source of the problem at that time. Past medical history; prostate cancer on oral antiandrogen therapy with bone mets, GI stromal tumor in 2005, type 2  diabetes with peripheral neuropathy, hypertension, atrial fibrillation on Eliquis, history of non-Hodgkin's lymphoma treated with CHOP in 2005. He had amputations of the left first and second toes secondary  to osteomyelitis. Does not appear that he has had an x-ray of the right foot ABI in our clinic on the right was noncompressible [see full arterial discussion above]. 1/19; area on the tip of the right great toe under a very mycotic nail tip. We use silver alginate. Electronic Signature(s) Signed: 05/09/2019 5:52:07 PM By: Linton Ham MD Entered By: Linton Ham on 05/09/2019 10:51:45 -------------------------------------------------------------------------------- Physical Exam Details Patient Name: Date of Service: [name], [name] 05/09/2019 10:00 AM Medical Record [id number] Patient Account Number: [id number] Date of Birth/Sex: Treating RN: 15-Jun-1923 (86 y.o. M) Primary Care Provider: Benny Lennert Other Clinician: Referring Provider: Treating Provider/Extender:Adrionna Delcid, Shayne Alken, LISA Weeks in Treatment: 1 Constitutional Patient is hypertensive.. Pulse regular and within target range for patient.Marland Kitchen Respirations regular, non-labored and within target range.. Temperature is normal and within the target range for the patient.Marland Kitchen Appears in no distress. Notes Wound exam; the areas on the tip of his right great toe under a thick mycotic nail. Once again I had to knock some of the nail back. Fortunately he is reasonably insensate here. I cleaned off the surface of the wound with debris. 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 [name]) Carlene Coria Primary Care Provider: Benny Lennert Other Clinician: Referring Provider: Treating Provider/Extender:Wofford Stratton, Shayne Alken, LISA Weeks in Treatment: 1 Verbal / Phone Orders: No Diagnosis Coding ICD-10 Coding Code Description E11.621 Type 2 diabetes mellitus with foot ulcer L97.511 Non-pressure chronic ulcer of other part of right foot limited to breakdown of skin E11.42 Type 2 diabetes mellitus with diabetic polyneuropathy E11.51 Type 2 diabetes mellitus with diabetic peripheral angiopathy without gangrene B35.1 Tinea unguium Follow-up Appointments Return Appointment in 1 week. Dressing Change Frequency Change dressing every day. Wound Cleansing Clean wound with Normal Saline. May shower and wash wound with soap and water. Primary Wound Dressing Calcium Alginate with Silver Secondary Dressing Dry Gauze - secure with tape Off-Loading Other: - SURGICAL SANDLE TO [facility] for wound care. - [facility] Electronic Signature(s) Signed: 05/09/2019 5:52:07 PM By: Linton Ham MD Signed: 05/10/2019 5:42:11 PM By: Carlene Coria RN Entered By: Carlene Coria on 05/09/2019 10:29:10 -------------------------------------------------------------------------------- Problem List Details Patient Name: Date of Service: [name], [name] 05/09/2019 10:00 AM Medical Record [id number] Patient Account Number: [id number] Date of Birth/Sex: Treating RN: 1923/07/07 (86 y.o. [name]) Carlene Coria Primary Care Provider: Benny Lennert Other Clinician: Referring Provider: Treating Provider/Extender:Jerzy Roepke, Shayne Alken, LISA Weeks in Treatment: 1 Active Problems ICD-10 Evaluated Encounter Code Description Active Date Today Diagnosis E11.621 Type 2 diabetes mellitus with foot ulcer 05/02/2019 No Yes L97.511 Non-pressure chronic ulcer of  other part of right foot 05/02/2019 No Yes limited to breakdown of skin E11.42 Type 2 diabetes mellitus with diabetic polyneuropathy 05/02/2019 No Yes E11.51 Type 2 diabetes mellitus with diabetic peripheral 05/02/2019 No Yes angiopathy without gangrene B35.1 Tinea unguium 05/02/2019 No Yes Inactive Problems Resolved Problems Electronic Signature(s) Signed: 05/09/2019 5:52:07 PM By: Linton Ham MD Entered By: Linton Ham on 05/09/2019 10:50:18 -------------------------------------------------------------------------------- Progress Note Details Patient Name: Date of Service: [name] 05/09/2019 10:00 AM Medical Record [id number] Patient Account Number: [id number] Date of Birth/Sex: Treating RN: 06-24-1923 (86 y.o. M) Primary Care Provider: Benny Lennert Other  Clinician: Referring Provider: Treating Provider/Extender:Arielys Wandersee, Shayne Alken, LISA Weeks in Treatment: 1 Subjective History of Present Illness (HPI) ADMISSION 05/02/2019 This is a 86 year old man accompanied by his daughter. He lives in [place] with his elderly wife. He has home caregivers as well. Apparently on February 28, 2019 they noted blood on his sock and noticed a wound on the tip of his right great toe. He has been cared for by Dr. Posey Pronto who is his podiatrist in [place]. Apparently they have been using Betadine to this area. The patient has a thick mycotic nail on the right as well which is embedded in the wound in some areas. Fortunately the patient has diabetic neuropathy and is not really experiencing any pain otherwise I think this would all be quite painful. The patient was previously treated in 2019 for wounds on the left foot. This included a fairly thorough vascular review by vein and vascular Dr. Oneida Alar. His ABIs at that time were noncompressible bilaterally TBI on the right was 0.88 with biphasic waveforms on the right and monophasic waveforms at the left. He ended up  having amputations of the left first and second toes. He did have an angiogram on 06/25/2017 which showed on the right the right common femoral profundofemoral for Morris and superficial femoral arteries were widely patent the right popliteal was widely patent. Tibial vessels were not well opacified secondary to motion artifact however he was felt to have all 3 tibial vessels intact with some calcification and some areas of stenosis within the posterior tibial artery but relatively good flow to the right lower leg. He also had the left reviewed as well which was the source of the problem at that time. Past medical history; prostate cancer on oral antiandrogen therapy with bone mets, GI stromal tumor in 2005, type 2 diabetes with peripheral neuropathy, hypertension, atrial fibrillation on Eliquis, history of non-Hodgkin's lymphoma treated with CHOP in 2005. He had amputations of the left first and second toes secondary to osteomyelitis. Does not appear that he has had an x-ray of the right foot ABI in our clinic on the right was noncompressible [see full arterial discussion above]. 1/19; area on the tip of the right great toe under a very mycotic nail tip. We use silver alginate. Objective Constitutional Patient is hypertensive.. Pulse regular and within target range for patient.Marland Kitchen Respirations regular, non-labored and within target range.. Temperature is normal and within the target range for the patient.Marland Kitchen Appears in no distress. Vitals Time Taken: 10:11 AM, Height: 70 in, Weight: 150 lbs, BMI: 21.5, Temperature: 98 F, Pulse: 88 bpm, Respiratory Rate: 18 breaths/min, Blood Pressure: 157/86 mmHg, Capillary Blood Glucose: 179 mg/dl. General Notes: Wound exam; the areas on the tip of his right great toe under a thick mycotic nail. Once again I had to knock some of the nail back. Fortunately he is reasonably insensate here. I cleaned off the surface of the wound with debris. Looks reasonably healthy  and is though it could support healing Integumentary (Hair, Skin) Wound #1 status is Open. Original cause of wound was Gradually Appeared. The wound is located on the Right Toe Great. The wound measures 0.5cm length x 0.6cm width x 0.1cm depth; 0.236cm^2 area and 0.024cm^3 volume. There is Fat Layer (Subcutaneous Tissue) Exposed exposed. There is no tunneling or undermining noted. There is a small amount of serosanguineous drainage noted. The wound margin is thickened. There is large (67-100%) red granulation within the wound bed. There is no necrotic tissue within the wound  bed. General Notes: scabbed over. Assessment Active Problems ICD-10 Type 2 diabetes mellitus with foot ulcer Non-pressure chronic ulcer of other part of right foot limited to breakdown of skin Type 2 diabetes mellitus with diabetic polyneuropathy Type 2 diabetes mellitus with diabetic peripheral angiopathy without gangrene Tinea unguium Procedures Wound #1 Pre-procedure diagnosis of Wound #1 is a Diabetic Wound/Ulcer of the Lower Extremity located on the Right Toe Great .Severity of Tissue Pre Debridement is: Fat layer exposed. There was a Excisional Skin/Subcutaneous Tissue Debridement with a total area of 0.3 sq cm performed by Ricard Dillon., MD. With the following instrument(s): Curette to remove Viable and Non-Viable tissue/material. Material removed includes Eschar, Subcutaneous Tissue, and Slough after achieving pain control using Lidocaine 5% topical ointment. No specimens were taken. A time out was conducted at 10:40, prior to the start of the procedure. A Moderate amount of bleeding was controlled with Pressure. The procedure was tolerated well with a pain level of 0 throughout and a pain level of 0 following the procedure. Post Debridement Measurements: 0.5cm length x 0.6cm width x 0.1cm depth; 0.024cm^3 volume. Character of Wound/Ulcer Post Debridement is improved. Severity of Tissue Post Debridement  is: Fat layer exposed. Post procedure Diagnosis Wound #1: Same as Pre-Procedure Plan Follow-up Appointments: Return Appointment in 1 week. Dressing Change Frequency: Change dressing every day. Wound Cleansing: Clean wound with Normal Saline. May shower and wash wound with soap and water. Primary Wound Dressing: Calcium Alginate with Silver Secondary Dressing: Dry Gauze - secure with tape Off-Loading: Other: - SURGICAL SANDLE TO RIGHT FOOT Home Health: [facility] for wound care. - [facility] 1. I am going to continue silver alginate for this week 2. They have home health changing the dressing 3. I continue to think he should have enough blood flow to heal this Electronic Signature(s) Signed: 05/09/2019 5:52:07 PM By: Linton Ham MD Entered By: Linton Ham on 05/09/2019 10:54:37 -------------------------------------------------------------------------------- SuperBill Details Patient Name: Date of Service: [name] 05/09/2019 Medical Record [id number] Patient Account Number: [id number] Date of Birth/Sex: Treating RN: Feb 11, 1924 (86 y.o. M) Primary Care Provider: Benny Lennert Other Clinician: Referring Provider: Treating Provider/Extender:Jian Hodgman, Shayne Alken, LISA Weeks in Treatment: 1 Diagnosis Coding ICD-10 Codes Code Description E11.621 Type 2 diabetes mellitus with foot ulcer L97.511 Non-pressure chronic ulcer of other part of right foot limited to breakdown of skin E11.42 Type 2 diabetes mellitus with diabetic polyneuropathy E11.51 Type 2 diabetes mellitus with diabetic peripheral angiopathy without gangrene B35.1 Tinea unguium Facility Procedures CPT4 Code Description: [id number] - DEB SUBQ TISSUE 20 SQ CM/< ICD-10 Diagnosis Description L97.511 Non-pressure chronic ulcer of other part of right foot limi E11.621 Type 2 diabetes mellitus with foot ulcer Modifier: ted to breakdo Quantity: 1 wn of skin Physician  Procedures CPT4 Code Description: [id number] - WC PHYS SUBQ TISS 20 SQ CM ICD-10 Diagnosis Description L97.511 Non-pressure chronic ulcer of other part of right foot limi E11.621 Type 2 diabetes mellitus with foot ulcer Modifier: ted to breakdown Quantity: 1 of skin Electronic Signature(s) Signed: 05/09/2019 5:52:07 PM By: Linton Ham MD Entered By: Linton Ham on 05/09/2019 10:54:55

## 2019-05-10 NOTE — Progress Notes (Signed)
GRANGER, PALAZZOLO ([id number]) Visit Report for 05/09/2019 Arrival Information Details Patient Name: Date of Service: JILBERTO, LABINE 05/09/2019 10:00 AM Medical Record [id number] Patient Account Number: [id number] Date of Birth/Sex: Treating RN: 1924-01-19 (86 y.o. [name] Primary Care [facility]: Benny Lennert Other Clinician: Referring Kaely Hollan: Treating Keighan Amezcua/Extender:Robson, Shayne Alken, LISA Weeks in Treatment: 1 Visit Information History Since Last Visit Added or deleted any medications: No Patient Arrived: Wheel Chair Any new allergies or adverse reactions: No Arrival Time: 10:10 Had a fall or experienced change in No Accompanied By: daughter- activities of daily living that may affect kathy risk of falls: Transfer Assistance: None Signs or symptoms of abuse/neglect since last No Patient Identification Verified: Yes visito Secondary Verification Process Yes Hospitalized since last visit: No Completed: Implantable device outside of the clinic excluding No Patient Requires Transmission-Based No cellular tissue based products placed in the center Precautions: since last visit: Patient Has Alerts: No Has Dressing in Place as Prescribed: Yes Pain Present Now: No Electronic Signature(s) Signed: 05/09/2019 5:39:51 PM By: Deon Pilling Entered By: Deon Pilling on 05/09/2019 10:25:18 -------------------------------------------------------------------------------- Encounter Discharge Information Details Patient Name: Date of Service: [name]. 05/09/2019 10:00 AM Medical Record [id number] Patient Account Number: [id number] Date of Birth/Sex: Treating RN: December 18, 1923 (86 y.o. [name] Primary Care Eldridge Marcott: Benny Lennert Other Clinician: Referring Eyonna Sandstrom: Treating Sorcha Rotunno/Extender:Robson, Shayne Alken, LISA Weeks in Treatment: 1 Encounter Discharge Information Items Post Procedure Vitals Discharge Condition: Stable Temperature  (F): 98 Ambulatory Status: Wheelchair Pulse (bpm): 88 Discharge Destination: Home Respiratory Rate (breaths/min): 18 Transportation: Private Auto Blood Pressure (mmHg): 157/86 Accompanied By: daughter Schedule Follow-up Appointment: Yes Clinical Summary of Care: Patient Declined Electronic Signature(s) Signed: 05/09/2019 5:45:51 PM By: [name] Entered By: [name] on 05/09/2019 11:12:35 -------------------------------------------------------------------------------- Lower Extremity Assessment Details Patient Name: Date of Service: LAREN, ESTRIDGE 05/09/2019 10:00 AM Medical Record [id number] Patient Account Number: [id number] Date of Birth/Sex: Treating RN: [birth year] (86 y.o. [name] Primary Care Jodi Criscuolo: Benny Lennert Other Clinician: Referring Marina Desire: Treating Tyese Finken/Extender:Robson, Shayne Alken, LISA Weeks in Treatment: 1 Edema Assessment Assessed: [Left: No] [Right: Yes] Edema: [Left: N] [Right: o] Calf Left: Right: Point of Measurement: cm From Medial Instep cm 30 cm Ankle Left: Right: Point of Measurement: cm From Medial Instep cm 20.5 cm Electronic Signature(s) Signed: 05/09/2019 5:39:51 PM By: Deon Pilling Entered By: Deon Pilling on 05/09/2019 10:26:14 -------------------------------------------------------------------------------- Multi Wound Chart Details Patient Name: Date of Service: [name]. 05/09/2019 10:00 AM Medical Record [id number] Patient Account Number: [id number] Date of Birth/Sex: Treating RN: [birth year] (86 y.o. M) Primary Care Vonna Brabson: Benny Lennert Other Clinician: Referring Erica Richwine: Treating Granger Chui/Extender:Robson, Shayne Alken, LISA Weeks in Treatment: 1 Vital Signs Height(in): 70 Capillary Blood Height(in): 70 Capillary Blood 179 Glucose(mg/dl): Weight(lbs): 150 Pulse(bpm): 53 Body Mass Index(BMI): 22 Blood Pressure(mmHg): 157/86 Temperature(F):  98 Respiratory 18 Rate(breaths/min): Photos: [1:No Photos] [N/A:N/A] Wound Location: [1:Right Toe Great] [N/A:N/A] Wounding Event: [1:Gradually Appeared] [N/A:N/A] Primary Etiology: [1:Diabetic Wound/Ulcer of the N/A Lower Extremity] Comorbid History: [1:Cataracts, Arrhythmia, Hypertension, Peripheral Arterial Disease, Type II Diabetes, Osteomyelitis, Dementia, Neuropathy, Received Chemotherapy] [N/A:N/A] Date Acquired: [1:02/28/2019] [N/A:N/A] Weeks of Treatment: [1:1] [N/A:N/A] Wound Status: [1:Open] [N/A:N/A] Measurements L x W x D 0.5x0.6x0.1 [N/A:N/A] (cm) Area (cm) : [1:0.236] [N/A:N/A] Volume (cm) : [1:0.024] [N/A:N/A] % Reduction in Area: [1:-274.60%] [N/A:N/A] % Reduction in Volume: -300.00% [N/A:N/A] Classification: [1:Grade 1] [N/A:N/A] Exudate Amount: [1:Small] [N/A:N/A] Exudate Type: [1:Serosanguineous] [N/A:N/A] Exudate Color: [1:red, brown] [N/A:N/A] Wound Margin: [1:Thickened] [N/A:N/A] Granulation Amount: [1:Large (67-100%)] [N/A:N/A] Granulation Quality: [  1:Red] [N/A:N/A] Necrotic Amount: [1:None Present (0%)] [N/A:N/A] Exposed Structures: [1:Fat Layer (Subcutaneous N/A Tissue) Exposed: Yes Fascia: No Tendon: No Muscle: No Joint: No Bone: No] Epithelialization: [1:Small (1-33%)] [N/A:N/A] Debridement: [1:Debridement - Excisional N/A] Pre-procedure [1:10:40] [N/A:N/A] Verification/Time Out Taken: Pain Control: [1:Lidocaine 5% topical ointment] [N/A:N/A] Tissue Debrided: [1:Necrotic/Eschar, Subcutaneous, Slough] [N/A:N/A] Level: [1:Skin/Subcutaneous Tissue N/A] Debridement Area (sq cm):0.3 [N/A:N/A] Instrument: [1:Curette] [N/A:N/A] Bleeding: [1:Moderate] [N/A:N/A] Hemostasis Achieved: [1:Pressure] [N/A:N/A] Procedural Pain: [1:0] [N/A:N/A] Post Procedural Pain: [1:0] [N/A:N/A] Debridement Treatment Procedure was tolerated [N/A:N/A] Response: [1:well] Post Debridement [1:0.5x0.6x0.1] [N/A:N/A] Measurements L x W x D (cm) Post Debridement [1:0.024]  [N/A:N/A] Volume: (cm) Assessment Notes: [1:scabbed over. Debridement] [N/A:N/A N/A] Treatment Notes Electronic Signature(s) Signed: 05/09/2019 5:52:07 PM By: Linton Ham MD Entered By: Linton Ham on 05/09/2019 10:50:28 -------------------------------------------------------------------------------- Multi-Disciplinary Care Plan Details Patient Name: Date of Service: CORNIE, BURCK 05/09/2019 10:00 AM Medical Record [id number] Patient Account Number: [id number] Date of Birth/Sex: Treating RN: 1924-02-11 (86 y.o. [name]) Carlene Coria Primary Care Marvelle Caudill: Benny Lennert Other Clinician: Referring Wael Maestas: Treating Dorinda Stehr/Extender:Robson, Shayne Alken, LISA Weeks in Treatment: 1 Active Inactive Wound/Skin Impairment Nursing Diagnoses: Knowledge deficit related to ulceration/compromised skin integrity Goals: Patient/caregiver will verbalize understanding of skin care regimen Date Initiated: 05/02/2019 Target Resolution Date: 06/02/2019 Goal Status: Active Ulcer/skin breakdown will have a volume reduction of 30% by week 4 Date Initiated: 05/02/2019 Target Resolution Date: 06/02/2019 Goal Status: Active Interventions: Assess patient/caregiver ability to obtain necessary supplies Assess patient/caregiver ability to perform ulcer/skin care regimen upon admission and as needed Assess ulceration(s) every visit Notes: Electronic Signature(s) Signed: 05/10/2019 5:42:11 PM By: Carlene Coria RN Entered By: Carlene Coria on 05/09/2019 10:29:18 -------------------------------------------------------------------------------- Pain Assessment Details Patient Name: Date of Service: YEREMI, LORIA 05/09/2019 10:00 AM Medical Record [id number] Patient Account Number: [id number] Date of Birth/Sex: Treating RN: Aug 24, 1923 (86 y.o. [name] Primary Care Ethal Gotay: Benny Lennert Other Clinician: Referring Arshad Oberholzer: Treating Brittnay Pigman/Extender:Robson, Shayne Alken,  LISA Weeks in Treatment: 1 Active Problems Location of Pain Severity and Description of Pain Patient Has Paino No Site Locations Rate the pain. Current Pain Level: 0 Pain Management and Medication Current Pain Management: Medication: No Cold Application: No Rest: No Massage: No Activity: No T.E.N.S.: No Heat Application: No Leg drop or elevation: No Is the Current Pain Management Adequate: Adequate How does your wound impact your activities of daily livingo Sleep: No Bathing: No Appetite: No Relationship With Others: No Bladder Continence: No Emotions: No Bowel Continence: No Work: No Toileting: No Drive: No Dressing: No Hobbies: No Electronic Signature(s) Signed: 05/09/2019 5:39:51 PM By: Deon Pilling Entered By: Deon Pilling on 05/09/2019 10:26:03 -------------------------------------------------------------------------------- Patient/Caregiver Education Details Patient Name: Date of Service: Ruberg, Christin E. 1/19/2021andnbsp10:00 AM Medical Record [id number] Patient Account Number: [id number] Date of Birth/Gender: [birth year] (86 y.o. M) Treating RN: Carlene Coria Primary Care Physician: Benny Lennert Other Clinician: Referring Physician: Treating Physician/Extender:Robson, Shayne Alken, Mart Piggs in Treatment: 1 Education Assessment Education Provided To: Patient Education Topics Provided Wound/Skin Impairment: Methods: Explain/Verbal Responses: State content correctly Electronic Signature(s) Signed: 05/10/2019 5:42:11 PM By: Carlene Coria RN Entered By: Carlene Coria on 05/09/2019 10:29:36 -------------------------------------------------------------------------------- Wound Assessment Details Patient Name: Date of Service: PHELIX, CALVANESE 05/09/2019 10:00 AM Medical Record [id number] Patient Account Number: [id number] Date of Birth/Sex: Treating RN: [birth year] (86 y.o. [name] Primary Care Tahtiana Rozier: Benny Lennert Other  Clinician: Referring Shavelle Runkel: Treating Ambriana Selway/Extender:Robson, Shayne Alken, LISA Weeks in Treatment: 1 Wound Status Wound Number: 1 Primary Diabetic Wound/Ulcer of the Lower Extremity Etiology: Wound Location: Right  Toe Great Wound Open Wounding Event: Gradually Appeared Status: Date Acquired: 02/28/2019 Comorbid Cataracts, Arrhythmia, Hypertension, Weeks Of Treatment: 1 History: Peripheral Arterial Disease, Type II Diabetes, Clustered Wound: No Osteomyelitis, Dementia, Neuropathy, Received Chemotherapy Photos Wound Measurements Length: (cm) 0.5 % Reduc Width: (cm) 0.6 % Reduc Depth: (cm) 0.1 Epithel Area: (cm) 0.236 Tunnel Volume: (cm) 0.024 Underm Wound Description Classification: Grade 1 Wound Margin: Thickened Exudate Amount: Small Exudate Type: Serosanguineous Exudate Color: red, brown Wound Bed Granulation Amount: Large (67-100%) Granulation Quality: Red Necrotic Amount: None Present (0%) Foul Odor After Cleansing: No Slough/Fibrino Yes Exposed Structure Fascia Exposed: No Fat Layer (Subcutaneous Tissue) Exposed: Yes Tendon Exposed: No Muscle Exposed: No Joint Exposed: No Bone Exposed: No tion in Area: -274.6% tion in Volume: -300% ialization: Small (1-33%) ing: No ining: No Assessment Notes scabbed over. Treatment Notes Wound #1 (Right Toe Great) 1. Cleanse With Wound Cleanser 2. Periwound Care Skin Prep 3. Primary Dressing Applied Calcium Alginate Ag 4. Secondary Dressing Dry Gauze Notes toe size stretch net, surgical shoe placed on. patient and daughter was educated regarding surgical shoe and treatment applied Electronic Signature(s) Signed: 05/09/2019 4:37:08 PM By: Mikeal Hawthorne EMT/HBOT Signed: 05/09/2019 5:39:51 PM By: Deon Pilling Entered By: Mikeal Hawthorne on 05/09/2019 15:02:22 -------------------------------------------------------------------------------- Vitals Details Patient Name: Date of Service: [name].  05/09/2019 10:00 AM Medical Record [id number] Patient Account Number: [id number] Date of Birth/Sex: Treating RN: Sep 12, 1923 (86 y.o. [name] Primary Care Paizleigh Wilds: Benny Lennert Other Clinician: Referring Kinston Magnan: Treating Keeghan Bialy/Extender:Robson, Shayne Alken, LISA Weeks in Treatment: 1 Vital Signs Time Taken: 10:11 Temperature (F): 98 Height (in): 70 Pulse (bpm): 88 Weight (lbs): 150 Respiratory Rate (breaths/min): 18 Body Mass Index (BMI): 21.5 Blood Pressure (mmHg): 157/86 Capillary Blood Glucose (mg/dl): 179 Reference Range: 80 - 120 mg / dl Electronic Signature(s) Signed: 05/09/2019 5:39:51 PM By: Deon Pilling Entered By: Deon Pilling on 05/09/2019 10:25:53

## 2019-05-11 DIAGNOSIS — C61 Malignant neoplasm of prostate: Secondary | ICD-10-CM | POA: Diagnosis not present

## 2019-05-11 DIAGNOSIS — R269 Unspecified abnormalities of gait and mobility: Secondary | ICD-10-CM | POA: Diagnosis not present

## 2019-05-11 DIAGNOSIS — I10 Essential (primary) hypertension: Secondary | ICD-10-CM | POA: Diagnosis not present

## 2019-05-11 DIAGNOSIS — C8581 Other specified types of non-Hodgkin lymphoma, lymph nodes of head, face, and neck: Secondary | ICD-10-CM | POA: Diagnosis not present

## 2019-05-11 DIAGNOSIS — E1165 Type 2 diabetes mellitus with hyperglycemia: Secondary | ICD-10-CM | POA: Diagnosis not present

## 2019-05-11 DIAGNOSIS — L97519 Non-pressure chronic ulcer of other part of right foot with unspecified severity: Secondary | ICD-10-CM | POA: Diagnosis not present

## 2019-05-16 ENCOUNTER — Other Ambulatory Visit: Payer: Self-pay

## 2019-05-16 ENCOUNTER — Encounter (HOSPITAL_BASED_OUTPATIENT_CLINIC_OR_DEPARTMENT_OTHER): Payer: Medicare Other | Admitting: Internal Medicine

## 2019-05-16 DIAGNOSIS — E11621 Type 2 diabetes mellitus with foot ulcer: Secondary | ICD-10-CM | POA: Diagnosis not present

## 2019-05-16 DIAGNOSIS — E1142 Type 2 diabetes mellitus with diabetic polyneuropathy: Secondary | ICD-10-CM | POA: Diagnosis not present

## 2019-05-16 DIAGNOSIS — L97511 Non-pressure chronic ulcer of other part of right foot limited to breakdown of skin: Secondary | ICD-10-CM | POA: Diagnosis not present

## 2019-05-16 DIAGNOSIS — B351 Tinea unguium: Secondary | ICD-10-CM | POA: Diagnosis not present

## 2019-05-16 DIAGNOSIS — S91201A Unspecified open wound of right great toe with damage to nail, initial encounter: Secondary | ICD-10-CM | POA: Diagnosis not present

## 2019-05-16 DIAGNOSIS — E1151 Type 2 diabetes mellitus with diabetic peripheral angiopathy without gangrene: Secondary | ICD-10-CM | POA: Diagnosis not present

## 2019-05-16 DIAGNOSIS — I10 Essential (primary) hypertension: Secondary | ICD-10-CM | POA: Diagnosis not present

## 2019-05-17 DIAGNOSIS — L97519 Non-pressure chronic ulcer of other part of right foot with unspecified severity: Secondary | ICD-10-CM | POA: Diagnosis not present

## 2019-05-17 DIAGNOSIS — R269 Unspecified abnormalities of gait and mobility: Secondary | ICD-10-CM | POA: Diagnosis not present

## 2019-05-17 DIAGNOSIS — C8581 Other specified types of non-Hodgkin lymphoma, lymph nodes of head, face, and neck: Secondary | ICD-10-CM | POA: Diagnosis not present

## 2019-05-17 DIAGNOSIS — E1165 Type 2 diabetes mellitus with hyperglycemia: Secondary | ICD-10-CM | POA: Diagnosis not present

## 2019-05-17 DIAGNOSIS — C61 Malignant neoplasm of prostate: Secondary | ICD-10-CM | POA: Diagnosis not present

## 2019-05-17 DIAGNOSIS — I10 Essential (primary) hypertension: Secondary | ICD-10-CM | POA: Diagnosis not present

## 2019-05-17 NOTE — Progress Notes (Signed)
[name], [name] ([id number]) Visit Report for 05/16/2019 HPI Details Patient Name: Date of Service: [name], [name] 05/16/2019 10:15 AM Medical Record [id number] Patient Account Number: [id number] Date of Birth/Sex: Treating RN: May 26, 1923 (86 y.o. M) Primary Care Provider: Benny Lennert Other Clinician: Referring Provider: Treating Provider/Extender:Lauri Till, Shayne Alken, LISA Weeks in Treatment: 2 History of Present Illness HPI Description: ADMISSION 05/02/2019 This is a 86 year old man accompanied by his daughter. He lives in [place] with his elderly wife. He has home caregivers as well. Apparently on February 28, 2019 they noted blood on his sock and noticed a wound on the tip of his right great toe. He has been cared for by Dr. Posey Pronto who is his podiatrist in [place]. Apparently they have been using Betadine to this area. The patient has a thick mycotic nail on the right as well which is embedded in the wound in some areas. Fortunately the patient has diabetic neuropathy and is not really experiencing any pain otherwise I think this would all be quite painful. The patient was previously treated in 2019 for wounds on the left foot. This included a fairly thorough vascular review by vein and vascular Dr. Oneida Alar. His ABIs at that time were noncompressible bilaterally TBI on the right was 0.88 with biphasic waveforms on the right and monophasic waveforms at the left. He ended up having amputations of the left first and second toes. He did have an angiogram on 06/25/2017 which showed on the right the right common femoral profundofemoral for Morris and superficial femoral arteries were widely patent the right popliteal was widely patent. Tibial vessels were not well opacified secondary to motion artifact however he was felt to have all 3 tibial vessels intact with some calcification and some areas of stenosis within the posterior tibial artery but relatively good flow  to the right lower leg. He also had the left reviewed as well which was the source of the problem at that time. Past medical history; prostate cancer on oral antiandrogen therapy with bone mets, GI stromal tumor in 2005, type 2 diabetes with peripheral neuropathy, hypertension, atrial fibrillation on Eliquis, history of non-Hodgkin's lymphoma treated with CHOP in 2005. He had amputations of the left first and second toes secondary to osteomyelitis. Does not appear that he has had an x-ray of the right foot ABI in our clinic on the right was noncompressible [see full arterial discussion above]. 1/19; area on the tip of the right great toe under a very mycotic nail tip. We use silver alginate. 1/26; area of the tip of the right great toe under a very mycotic nail. A lot of this seems to have closed down we have been using silver alginate Electronic Signature(s) Signed: 05/16/2019 5:32:11 PM By: Linton Ham MD Entered By: Linton Ham on 05/16/2019 11:59:43 -------------------------------------------------------------------------------- Physical Exam Details Patient Name: Date of Service: [name], [name] 05/16/2019 10:15 AM Medical Record [id number] Patient Account Number: [id number] Date of Birth/Sex: Treating RN: 01-06-1924 (86 y.o. M) Primary Care Provider: Benny Lennert Other Clinician: Referring Provider: Treating Provider/Extender:Roel Douthat, Shayne Alken, LISA Weeks in Treatment: 2 Cardiovascular Pedal pulses absent bilaterally.. Notes Wound exam the areas on the tip of his right great toe I initially under a thick mycotic nail that I had to remove the tip of. I think the wound is contracted quite a bit surface looks healthy no debridement Electronic Signature(s) Signed: 05/16/2019 5:32:11 PM By: Linton Ham MD Entered By: Linton Ham on 05/16/2019 12:02:57 -------------------------------------------------------------------------------- Physician Orders  Details Patient Name: Date  of Service: [name], [name] 05/16/2019 10:15 AM Medical Record [id number] Patient Account Number: [id number] Date of Birth/Sex: Treating RN: [birth year] (86 y.o. [name]) Carlene Coria Primary Care Provider: Benny Lennert Other Clinician: Referring Provider: Treating Provider/Extender:Tayjah Lobdell, Shayne Alken, LISA Weeks in Treatment: 2 Verbal / Phone Orders: No Diagnosis Coding ICD-10 Coding Code Description E11.621 Type 2 diabetes mellitus with foot ulcer L97.511 Non-pressure chronic ulcer of other part of right foot limited to breakdown of skin E11.42 Type 2 diabetes mellitus with diabetic polyneuropathy E11.51 Type 2 diabetes mellitus with diabetic peripheral angiopathy without gangrene B35.1 Tinea unguium Follow-up Appointments Return Appointment in 2 weeks. Dressing Change Frequency Change dressing every day. Wound Cleansing Clean wound with Normal Saline. May shower and wash wound with soap and water. Primary Wound Dressing Calcium Alginate with Silver Secondary Dressing Dry Gauze - secure with tape Off-Loading Other: - SURGICAL SANDLE TO [facility] skilled nursing for wound care. - [facility] Electronic Signature(s) Signed: 05/16/2019 5:32:11 PM By: Linton Ham MD Signed: 05/17/2019 7:19:07 AM By: Carlene Coria RN Entered By: Carlene Coria on 05/16/2019 11:41:23 -------------------------------------------------------------------------------- Problem List Details Patient Name: Date of Service: [name], [name] 05/16/2019 10:15 AM Medical Record [id number] Patient Account Number: [id number] Date of Birth/Sex: Treating RN: Feb 22, 1924 (86 y.o. [name]) Carlene Coria Primary Care Provider: Benny Lennert Other Clinician: Referring Provider: Treating Provider/Extender:Leianne Callins, Shayne Alken, LISA Weeks in Treatment: 2 Active Problems ICD-10 Evaluated Encounter Code Description Active Date Today Diagnosis E11.621  Type 2 diabetes mellitus with foot ulcer 05/02/2019 No Yes L97.511 Non-pressure chronic ulcer of other part of right foot 05/02/2019 No Yes limited to breakdown of skin E11.42 Type 2 diabetes mellitus with diabetic polyneuropathy 05/02/2019 No Yes E11.51 Type 2 diabetes mellitus with diabetic peripheral 05/02/2019 No Yes angiopathy without gangrene B35.1 Tinea unguium 05/02/2019 No Yes Inactive Problems Resolved Problems Electronic Signature(s) Signed: 05/16/2019 5:32:11 PM By: Linton Ham MD Entered By: Linton Ham on 05/16/2019 11:58:36 -------------------------------------------------------------------------------- Progress Note Details Patient Name: Date of Service: [name] 05/16/2019 10:15 AM Medical Record [id number] Patient Account Number: [id number] Date of Birth/Sex: Treating RN: 03/22/1924 (86 y.o. M) Primary Care Provider: Benny Lennert Other Clinician: Referring Provider: Treating Provider/Extender:Donne Baley, Shayne Alken, LISA Weeks in Treatment: 2 Subjective History of Present Illness (HPI) ADMISSION 05/02/2019 This is a 86 year old man accompanied by his daughter. He lives in [place] with his elderly wife. He has home caregivers as well. Apparently on February 28, 2019 they noted blood on his sock and noticed a wound on the tip of his right great toe. He has been cared for by Dr. Posey Pronto who is his podiatrist in [place]. Apparently they have been using Betadine to this area. The patient has a thick mycotic nail on the right as well which is embedded in the wound in some areas. Fortunately the patient has diabetic neuropathy and is not really experiencing any pain otherwise I think this would all be quite painful. The patient was previously treated in 2019 for wounds on the left foot. This included a fairly thorough vascular review by vein and vascular Dr. Oneida Alar. His ABIs at that time were noncompressible bilaterally TBI on the right was  0.88 with biphasic waveforms on the right and monophasic waveforms at the left. He ended up having amputations of the left first and second toes. He did have an angiogram on 06/25/2017 which showed on the right the right common femoral profundofemoral for Morris and superficial femoral arteries were widely patent the right popliteal was  widely patent. Tibial vessels were not well opacified secondary to motion artifact however he was felt to have all 3 tibial vessels intact with some calcification and some areas of stenosis within the posterior tibial artery but relatively good flow to the right lower leg. He also had the left reviewed as well which was the source of the problem at that time. Past medical history; prostate cancer on oral antiandrogen therapy with bone mets, GI stromal tumor in 2005, type 2 diabetes with peripheral neuropathy, hypertension, atrial fibrillation on Eliquis, history of non-Hodgkin's lymphoma treated with CHOP in 2005. He had amputations of the left first and second toes secondary to osteomyelitis. Does not appear that he has had an x-ray of the right foot ABI in our clinic on the right was noncompressible [see full arterial discussion above]. 1/19; area on the tip of the right great toe under a very mycotic nail tip. We use silver alginate. 1/26; area of the tip of the right great toe under a very mycotic nail. A lot of this seems to have closed down we have been using silver alginate Objective Constitutional Vitals Time Taken: 11:19 AM, Height: 70 in, Source: Stated, Weight: 150 lbs, Source: Stated, BMI: 21.5, Temperature: 98.1 F, Pulse: 58 bpm, Respiratory Rate: 18 breaths/min, Blood Pressure: 146/56 mmHg, Capillary Blood Glucose: 136 mg/dl. General Notes: glucose per pt report this am Cardiovascular Pedal pulses absent bilaterally.. General Notes: Wound exam the areas on the tip of his right great toe I initially under a thick mycotic nail that I had to remove  the tip of. I think the wound is contracted quite a bit surface looks healthy no debridement Integumentary (Hair, Skin) Wound #1 status is Open. Original cause of wound was Gradually Appeared. The wound is located on the Right Toe Great. The wound measures 0.2cm length x 0.5cm width x 0.1cm depth; 0.079cm^2 area and 0.008cm^3 volume. There is Fat Layer (Subcutaneous Tissue) Exposed exposed. There is no tunneling or undermining noted. There is a small amount of serosanguineous drainage noted. The wound margin is thickened. There is large (67-100%) red granulation within the wound bed. There is no necrotic tissue within the wound bed. Assessment Active Problems ICD-10 Type 2 diabetes mellitus with foot ulcer Non-pressure chronic ulcer of other part of right foot limited to breakdown of skin Type 2 diabetes mellitus with diabetic polyneuropathy Type 2 diabetes mellitus with diabetic peripheral angiopathy without gangrene Tinea unguium Plan Follow-up Appointments: Return Appointment in 2 weeks. Dressing Change Frequency: Change dressing every day. Wound Cleansing: Clean wound with Normal Saline. May shower and wash wound with soap and water. Primary Wound Dressing: Calcium Alginate with Silver Secondary Dressing: Dry Gauze - secure with tape Off-Loading: Other: - SURGICAL SANDLE TO RIGHT FOOT Home Health: [facility] for wound care. - [facility] 1. Continue with silver alginate 2. No further debridement was required today. 3. I continue to think he has enough blood flow to heal this wound Electronic Signature(s) Signed: 05/16/2019 5:32:11 PM By: Linton Ham MD Entered By: Linton Ham on 05/16/2019 12:03:35 -------------------------------------------------------------------------------- SuperBill Details Patient Name: Date of Service: [name] 05/16/2019 Medical Record [id number] Patient Account Number: [id number] Date of  Birth/Sex: Treating RN: [birth year] (86 y.o. [name] Primary Care Provider: Benny Lennert Other Clinician: Referring Provider: Treating Provider/Extender:Mekhia Brogan, Shayne Alken, LISA Weeks in Treatment: 2 Diagnosis Coding ICD-10 Codes Code Description E11.621 Type 2 diabetes mellitus with foot ulcer L97.511 Non-pressure chronic ulcer of other part of right foot limited  to breakdown of skin E11.42 Type 2 diabetes mellitus with diabetic polyneuropathy E11.51 Type 2 diabetes mellitus with diabetic peripheral angiopathy without gangrene B35.1 Tinea unguium Facility Procedures CPT4 Code: [id number] Description: [id number] - WOUND CARE VISIT-LEV 3 EST PT Modifier: Quantity: 1 Physician Procedures CPT4 Code Description: [id number] - WC PHYS LEVEL 3 - EST PT ICD-10 Diagnosis Description E11.621 Type 2 diabetes mellitus with foot ulcer L97.511 Non-pressure chronic ulcer of other part of right foot limi Modifier: ted to breakdo Quantity: 1 wn of skin Electronic Signature(s) Signed: 05/16/2019 5:32:11 PM By: Linton Ham MD Entered By: Linton Ham on 05/16/2019 12:03:54

## 2019-05-18 ENCOUNTER — Other Ambulatory Visit (HOSPITAL_COMMUNITY): Payer: Self-pay | Admitting: *Deleted

## 2019-05-18 ENCOUNTER — Other Ambulatory Visit: Payer: Self-pay | Admitting: Family Medicine

## 2019-05-18 DIAGNOSIS — Z7901 Long term (current) use of anticoagulants: Secondary | ICD-10-CM | POA: Diagnosis not present

## 2019-05-18 DIAGNOSIS — E1165 Type 2 diabetes mellitus with hyperglycemia: Secondary | ICD-10-CM | POA: Diagnosis not present

## 2019-05-18 DIAGNOSIS — I10 Essential (primary) hypertension: Secondary | ICD-10-CM | POA: Diagnosis not present

## 2019-05-18 DIAGNOSIS — R413 Other amnesia: Secondary | ICD-10-CM

## 2019-05-18 DIAGNOSIS — C61 Malignant neoplasm of prostate: Secondary | ICD-10-CM | POA: Diagnosis not present

## 2019-05-18 DIAGNOSIS — E43 Unspecified severe protein-calorie malnutrition: Secondary | ICD-10-CM | POA: Diagnosis not present

## 2019-05-18 DIAGNOSIS — R269 Unspecified abnormalities of gait and mobility: Secondary | ICD-10-CM | POA: Diagnosis not present

## 2019-05-18 DIAGNOSIS — Z7984 Long term (current) use of oral hypoglycemic drugs: Secondary | ICD-10-CM | POA: Diagnosis not present

## 2019-05-18 DIAGNOSIS — L97519 Non-pressure chronic ulcer of other part of right foot with unspecified severity: Secondary | ICD-10-CM | POA: Diagnosis not present

## 2019-05-18 DIAGNOSIS — Z89422 Acquired absence of other left toe(s): Secondary | ICD-10-CM | POA: Diagnosis not present

## 2019-05-18 DIAGNOSIS — C49A0 Gastrointestinal stromal tumor, unspecified site: Secondary | ICD-10-CM

## 2019-05-18 DIAGNOSIS — M6281 Muscle weakness (generalized): Secondary | ICD-10-CM | POA: Diagnosis not present

## 2019-05-18 DIAGNOSIS — C8581 Other specified types of non-Hodgkin lymphoma, lymph nodes of head, face, and neck: Secondary | ICD-10-CM | POA: Diagnosis not present

## 2019-05-18 DIAGNOSIS — D472 Monoclonal gammopathy: Secondary | ICD-10-CM

## 2019-05-18 DIAGNOSIS — I4821 Permanent atrial fibrillation: Secondary | ICD-10-CM | POA: Diagnosis not present

## 2019-05-18 NOTE — Progress Notes (Signed)
[name], [name] ([id number]) Visit Report for 05/16/2019 Arrival Information Details Patient Name: Date of Service: [name], [name] 05/16/2019 10:15 AM Medical Record [id number] Patient Account Number: [id number] Date of Birth/Sex: Treating RN: 03/07/1924 (86 y.o. [name] Primary Care Orpheus Hayhurst: Benny Lennert Other Clinician: Referring Dierdra Salameh: Treating Juanita Streight/Extender:Robson, Shayne Alken, LISA Weeks in Treatment: 2 Visit Information History Since Last Visit Added or deleted any medications: No Patient Arrived: Wheel Chair Any new allergies or adverse reactions: No Arrival Time: 11:17 Had a fall or experienced change in No activities of daily living that may affect Accompanied By: dgt risk of falls: Transfer Assistance: None Signs or symptoms of abuse/neglect since last No Patient Identification Verified: Yes visito Secondary Verification Process Completed: Yes Hospitalized since last visit: No Patient Requires Transmission-Based No Implantable device outside of the clinic excluding No Precautions: cellular tissue based products placed in the center Patient Has Alerts: No since last visit: Has Dressing in Place as Prescribed: Yes Pain Present Now: No Electronic Signature(s) Signed: 05/16/2019 6:19:30 PM By: Baruch Gouty RN, BSN Entered By: Baruch Gouty on 05/16/2019 11:19:18 -------------------------------------------------------------------------------- Clinic Level of Care Assessment Details Patient Name: Date of Service: [name], [name] 05/16/2019 10:15 AM Medical Record [id number] Patient Account Number: [id number] Date of Birth/Sex: Treating RN: [birth year] (86 y.o. [name]) Carlene Coria Primary Care Knolan Simien: Benny Lennert Other Clinician: Referring Alila Sotero: Treating Kaniyah Lisby/Extender:Robson, Shayne Alken, LISA Weeks in Treatment: 2 Clinic Level of Care Assessment Items TOOL 4 Quantity Score X - Use when only an EandM is performed on  FOLLOW-UP visit 1 0 ASSESSMENTS - Nursing Assessment / Reassessment X - Reassessment of Co-morbidities (includes updates in patient status) 1 10 X - Reassessment of Adherence to Treatment Plan 1 5 ASSESSMENTS - Wound and Skin Assessment / Reassessment X - Simple Wound Assessment / Reassessment - one wound 1 5 []  - Complex Wound Assessment / Reassessment - multiple wounds 0 []  - Dermatologic / Skin Assessment (not related to wound area) 0 ASSESSMENTS - Focused Assessment []  - Circumferential Edema Measurements - multi extremities 0 []  - Nutritional Assessment / Counseling / Intervention 0 []  - Lower Extremity Assessment (monofilament, tuning fork, pulses) 0 []  - Peripheral Arterial Disease Assessment (using hand held doppler) 0 ASSESSMENTS - Ostomy and/or Continence Assessment and Care []  - Incontinence Assessment and Management 0 []  - Ostomy Care Assessment and Management (repouching, etc.) 0 PROCESS - Coordination of Care X - Simple Patient / Family Education for ongoing care 1 15 []  - Complex (extensive) Patient / Family Education for ongoing care 0 X - Staff obtains Programmer, systems, Records, Test Results / Process Orders 1 10 []  - Staff telephones HHA, Nursing Homes / Clarify orders / etc 0 []  - Routine Transfer to another Facility (non-emergent condition) 0 []  - Routine Hospital Admission (non-emergent condition) 0 []  - New Admissions / Biomedical engineer / Ordering NPWT, Apligraf, etc. 0 []  - Emergency Hospital Admission (emergent condition) 0 X - Simple Discharge Coordination 1 10 []  - Complex (extensive) Discharge Coordination 0 PROCESS - Special Needs []  - Pediatric / Minor Patient Management 0 []  - Isolation Patient Management 0 []  - Hearing / Language / Visual special needs 0 []  - Assessment of Community assistance (transportation, D/C planning, etc.) 0 []  - Additional assistance / Altered mentation 0 []  - Support Surface(s) Assessment (bed, cushion, seat, etc.)  0 INTERVENTIONS - Wound Cleansing / Measurement X - Simple Wound Cleansing - one wound 1 5 []  - Complex Wound Cleansing - multiple wounds 0 X - Wound  Imaging (photographs - any number of wounds) 1 5 []  - Wound Tracing (instead of photographs) 0 X - Simple Wound Measurement - one wound 1 5 []  - Complex Wound Measurement - multiple wounds 0 INTERVENTIONS - Wound Dressings []  - Small Wound Dressing one or multiple wounds 0 X - Medium Wound Dressing one or multiple wounds 1 15 []  - Large Wound Dressing one or multiple wounds 0 []  - Application of Medications - topical 0 []  - Application of Medications - injection 0 INTERVENTIONS - Miscellaneous []  - External ear exam 0 []  - Specimen Collection (cultures, biopsies, blood, body fluids, etc.) 0 []  - Specimen(s) / Culture(s) sent or taken to Lab for analysis 0 []  - Patient Transfer (multiple staff / Civil Service fast streamer / Similar devices) 0 []  - Simple Staple / Suture removal (25 or less) 0 []  - Complex Staple / Suture removal (26 or more) 0 []  - Hypo / Hyperglycemic Management (close monitor of Blood Glucose) 0 []  - Ankle / Brachial Index (ABI) - do not check if billed separately 0 X - Vital Signs 1 5 Has the patient been seen at the hospital within the last three years: Yes Total Score: 90 Level Of Care: New/Established - Level 3 Electronic Signature(s) Signed: 05/17/2019 7:19:07 AM By: Carlene Coria RN Entered By: Carlene Coria on 05/16/2019 11:37:51 -------------------------------------------------------------------------------- Encounter Discharge Information Details Patient Name: Date of Service: [name] 05/16/2019 10:15 AM Medical Record [id number] Patient Account Number: [id number] Date of Birth/Sex: Treating RN: Aug 28, 1923 (86 y.o. [name] Primary Care Shakisha Abend: Benny Lennert Other Clinician: Referring Verland Sprinkle: Treating Keiley Levey/Extender:Robson, Shayne Alken, LISA Weeks in Treatment: 2 Encounter Discharge  Information Items Discharge Condition: Stable Ambulatory Status: Wheelchair Discharge Destination: Home Transportation: Private Auto Accompanied By: daughter Schedule Follow-up Appointment: Yes Clinical Summary of Care: Patient Declined Electronic Signature(s) Signed: 05/18/2019 7:44:20 AM By: [name] Entered By: [name] on 05/16/2019 12:24:56 -------------------------------------------------------------------------------- Lower Extremity Assessment Details Patient Name: Date of Service: [name], [name] 05/16/2019 10:15 AM Medical Record [id number] Patient Account Number: [id number] Date of Birth/Sex: Treating RN: 09-Nov-1923 (86 y.o. [name] Primary Care Vasilia Dise: Benny Lennert Other Clinician: Referring Asah Lamay: Treating Arriyana Rodell/Extender:Robson, Shayne Alken, LISA Weeks in Treatment: 2 Edema Assessment Assessed: [Left: No] [Right: No] Edema: [Left: N] [Right: o] Calf Left: Right: Point of Measurement: cm From Medial Instep cm 30 cm Ankle Left: Right: Point of Measurement: cm From Medial Instep cm 20.5 cm Vascular Assessment Pulses: Dorsalis Pedis Palpable: [Right:Yes] Electronic Signature(s) Signed: 05/16/2019 6:19:30 PM By: Baruch Gouty RN, BSN Entered By: Baruch Gouty on 05/16/2019 11:24:32 -------------------------------------------------------------------------------- Multi Wound Chart Details Patient Name: Date of Service: [name] 05/16/2019 10:15 AM Medical Record [id number] Patient Account Number: [id number] Date of Birth/Sex: Treating RN: September 27, 1923 (86 y.o. M) Primary Care Willem Klingensmith: Benny Lennert Other Clinician: Referring Amadou Katzenstein: Treating Demita Tobia/Extender:Robson, Shayne Alken, LISA Weeks in Treatment: 2 Vital Signs Height(in): 70 Capillary Blood 136 Glucose(mg/dl): Weight(lbs): 150 Pulse(bpm): 65 Body Mass Index(BMI): 22 Blood Pressure(mmHg): 146/56 Temperature(F): 98.1 Respiratory  18 Rate(breaths/min): Photos: [1:No Photos] [N/A:N/A] Wound Location: [1:Right Toe Great] [N/A:N/A] Wounding Event: [1:Gradually Appeared] [N/A:N/A] Primary Etiology: [1:Diabetic Wound/Ulcer of the N/A Lower Extremity] Comorbid History: [1:Cataracts, Arrhythmia, Hypertension, Peripheral Arterial Disease, Type II Diabetes, Osteomyelitis, Dementia, Neuropathy, Received Chemotherapy] [N/A:N/A] Date Acquired: [1:02/28/2019] [N/A:N/A] Weeks of Treatment: [1:2] [N/A:N/A] Wound Status: [1:Open] [N/A:N/A] Measurements L x W x D 0.2x0.5x0.1 [N/A:N/A] (cm) Area (cm) : [1:0.079] [N/A:N/A] Volume (cm) : [1:0.008] [N/A:N/A] % Reduction in Area: [1:-25.40%] [N/A:N/A] % Reduction in Volume: -  33.30% [N/A:N/A] Classification: [1:Grade 1] [N/A:N/A] Exudate Amount: [1:Small] [N/A:N/A] Exudate Type: [1:Serosanguineous] [N/A:N/A] Exudate Color: [1:red, brown] [N/A:N/A] Wound Margin: [1:Thickened] [N/A:N/A] Granulation Amount: [1:Large (67-100%)] [N/A:N/A] Granulation Quality: [1:Red] [N/A:N/A] Necrotic Amount: [1:None Present (0%)] [N/A:N/A] Exposed Structures: [1:Fat Layer (Subcutaneous Tissue) Exposed: Yes Fascia: No Tendon: No Muscle: No Joint: No Bone: No Medium (34-66%)] [N/A:N/A N/A] Treatment Notes Electronic Signature(s) Signed: 05/16/2019 5:32:11 PM By: Linton Ham MD Entered By: Linton Ham on 05/16/2019 11:58:54 -------------------------------------------------------------------------------- Multi-Disciplinary Care Plan Details Patient Name: Date of Service: [name] 05/16/2019 10:15 AM Medical Record [id number] Patient Account Number: [id number] Date of Birth/Sex: Treating RN: June 16, 1923 (86 y.o. [name] Primary Care Arminda Foglio: Benny Lennert Other Clinician: Referring Yamir Carignan: Treating Tiffiny Worthy/Extender:Robson, Shayne Alken, LISA Weeks in Treatment: 2 Active Inactive Wound/Skin Impairment Nursing Diagnoses: Knowledge deficit related to  ulceration/compromised skin integrity Goals: Patient/caregiver will verbalize understanding of skin care regimen Date Initiated: 05/02/2019 Target Resolution Date: 06/02/2019 Goal Status: Active Ulcer/skin breakdown will have a volume reduction of 30% by week 4 Date Initiated: 05/02/2019 Target Resolution Date: 06/02/2019 Goal Status: Active Interventions: Assess patient/caregiver ability to obtain necessary supplies Assess patient/caregiver ability to perform ulcer/skin care regimen upon admission and as needed Assess ulceration(s) every visit Notes: Electronic Signature(s) Signed: 05/17/2019 7:19:07 AM By: Carlene Coria RN Entered By: Carlene Coria on 05/16/2019 10:47:30 -------------------------------------------------------------------------------- Pain Assessment Details Patient Name: Date of Service: [name], [name] 05/16/2019 10:15 AM Medical Record [id number] Patient Account Number: [id number] Date of Birth/Sex: Treating RN: 06/28/1923 (86 y.o. [name] Primary Care Lucas Exline: Benny Lennert Other Clinician: Referring Jaidyn Kuhl: Treating Gamal Todisco/Extender:Robson, Shayne Alken, LISA Weeks in Treatment: 2 Active Problems Location of Pain Severity and Description of Pain Patient Has Paino No Site Locations Rate the pain. Current Pain Level: 0 Pain Management and Medication Current Pain Management: Electronic Signature(s) Signed: 05/16/2019 6:19:30 PM By: Baruch Gouty RN, BSN Entered By: Baruch Gouty on 05/16/2019 11:20:17 -------------------------------------------------------------------------------- Patient/Caregiver Education Details Patient Name: Date of Service: [name] 1/26/2021andnbsp10:15 AM Medical Record [phone] Patient Account Number: [id number] Date of Birth/Gender: April 27, 1923 (86 y.o. M) Treating RN: Carlene Coria Primary Care Physician: Benny Lennert Other Clinician: Referring Physician: Treating Physician/Extender:Robson,  Shayne Alken, Mart Piggs in Treatment: 2 Education Assessment Education Provided To: Patient Education Topics Provided Wound/Skin Impairment: Methods: Explain/Verbal Responses: State content correctly Electronic Signature(s) Signed: 05/17/2019 7:19:07 AM By: Carlene Coria RN Entered By: Carlene Coria on 05/16/2019 10:56:04 -------------------------------------------------------------------------------- Wound Assessment Details Patient Name: Date of Service: [name], [name] 05/16/2019 10:15 AM Medical Record [id number] Patient Account Number: [id number] Date of Birth/Sex: Treating RN: 16-Sep-1923 (86 y.o. M) Primary Care Chad Tiznado: Benny Lennert Other Clinician: Referring Leighanna Kirn: Treating Madicyn Mesina/Extender:Robson, Shayne Alken, LISA Weeks in Treatment: 2 Wound Status Wound Number: 1 Primary Diabetic Wound/Ulcer of the Lower Extremity Etiology: Wound Location: Right Toe Great Wound Open Wounding Event: Gradually Appeared Status: Date Acquired: 02/28/2019 Comorbid Cataracts, Arrhythmia, Hypertension, Weeks Of Treatment: 2 History: Peripheral Arterial Disease, Type II Diabetes, Clustered Wound: No Osteomyelitis, Dementia, Neuropathy, Received Chemotherapy Photos Wound Measurements Length: (cm) 0.2 % Reducti Width: (cm) 0.5 % Reducti Depth: (cm) 0.1 Epithelia Area: (cm) 0.079 Tunnelin Volume: (cm) 0.008 Undermin Wound Description Classification: Grade 1 Wound Margin: Thickened Exudate Amount: Small Exudate Type: Serosanguineous Exudate Color: red, brown Wound Bed Granulation Amount: Large (67-100%) Granulation Quality: Red Necrotic Amount: None Present (0%) Foul Odor After Cleansing: No Slough/Fibrino Yes Exposed Structure Fascia Exposed: No Fat Layer (Subcutaneous Tissue) Exposed: Yes Tendon Exposed: No Muscle Exposed: No Joint Exposed: No Bone Exposed: No  on in Area: -25.4% on in Volume: -33.3% lization: Medium (34-66%) g: No ing: No Treatment  Notes Wound #1 (Right Toe Great) 1. Cleanse With Wound Cleanser 2. Periwound Care Skin Prep 3. Primary Dressing Applied Calcium Alginate Ag 4. Secondary Dressing Dry Gauze Notes toe size stretch net, surgical shoe placed on. Electronic Signature(s) Signed: 05/16/2019 5:09:30 PM By: Mikeal Hawthorne EMT/HBOT Entered By: Mikeal Hawthorne on 05/16/2019 15:47:59 -------------------------------------------------------------------------------- Vitals Details Patient Name: Date of Service: [name], [name] 05/16/2019 10:15 AM Medical Record [id number] Patient Account Number: [id number] Date of Birth/Sex: Treating RN: Jul 08, 1923 (86 y.o. [name] Primary Care Abdikadir Fohl: Benny Lennert Other Clinician: Referring Dalayla Aldredge: Treating Teonia Yager/Extender:Robson, Shayne Alken, LISA Weeks in Treatment: 2 Vital Signs Time Taken: 11:19 Temperature (F): 98.1 Height (in): 70 Pulse (bpm): 58 Source: Stated Respiratory Rate (breaths/min): 18 Weight (lbs): 150 Blood Pressure (mmHg): 146/56 Source: Stated Capillary Blood Glucose (mg/dl): 136 Body Mass Index (BMI): 21.5 Reference Range: 80 - 120 mg / dl Notes glucose per pt report this am Electronic Signature(s) Signed: 05/16/2019 6:19:30 PM By: Baruch Gouty RN, BSN Entered By: Baruch Gouty on 05/16/2019 11:20:08

## 2019-05-22 ENCOUNTER — Encounter (HOSPITAL_COMMUNITY): Payer: Self-pay | Admitting: Hematology

## 2019-05-22 ENCOUNTER — Inpatient Hospital Stay (HOSPITAL_COMMUNITY): Payer: Medicare Other

## 2019-05-22 ENCOUNTER — Inpatient Hospital Stay (HOSPITAL_BASED_OUTPATIENT_CLINIC_OR_DEPARTMENT_OTHER): Payer: Medicare Other | Admitting: Hematology

## 2019-05-22 ENCOUNTER — Other Ambulatory Visit: Payer: Self-pay

## 2019-05-22 ENCOUNTER — Inpatient Hospital Stay (HOSPITAL_COMMUNITY): Payer: Medicare Other | Attending: Hematology

## 2019-05-22 DIAGNOSIS — Z8249 Family history of ischemic heart disease and other diseases of the circulatory system: Secondary | ICD-10-CM | POA: Diagnosis not present

## 2019-05-22 DIAGNOSIS — C7951 Secondary malignant neoplasm of bone: Secondary | ICD-10-CM | POA: Insufficient documentation

## 2019-05-22 DIAGNOSIS — C61 Malignant neoplasm of prostate: Secondary | ICD-10-CM

## 2019-05-22 DIAGNOSIS — Z79899 Other long term (current) drug therapy: Secondary | ICD-10-CM | POA: Insufficient documentation

## 2019-05-22 DIAGNOSIS — I4891 Unspecified atrial fibrillation: Secondary | ICD-10-CM | POA: Insufficient documentation

## 2019-05-22 DIAGNOSIS — Z79818 Long term (current) use of other agents affecting estrogen receptors and estrogen levels: Secondary | ICD-10-CM | POA: Diagnosis not present

## 2019-05-22 DIAGNOSIS — Z7984 Long term (current) use of oral hypoglycemic drugs: Secondary | ICD-10-CM | POA: Insufficient documentation

## 2019-05-22 DIAGNOSIS — E1136 Type 2 diabetes mellitus with diabetic cataract: Secondary | ICD-10-CM | POA: Diagnosis not present

## 2019-05-22 DIAGNOSIS — L97519 Non-pressure chronic ulcer of other part of right foot with unspecified severity: Secondary | ICD-10-CM | POA: Diagnosis not present

## 2019-05-22 DIAGNOSIS — Z7952 Long term (current) use of systemic steroids: Secondary | ICD-10-CM | POA: Insufficient documentation

## 2019-05-22 DIAGNOSIS — D472 Monoclonal gammopathy: Secondary | ICD-10-CM | POA: Insufficient documentation

## 2019-05-22 DIAGNOSIS — M1990 Unspecified osteoarthritis, unspecified site: Secondary | ICD-10-CM | POA: Diagnosis not present

## 2019-05-22 DIAGNOSIS — R269 Unspecified abnormalities of gait and mobility: Secondary | ICD-10-CM | POA: Diagnosis not present

## 2019-05-22 DIAGNOSIS — E1165 Type 2 diabetes mellitus with hyperglycemia: Secondary | ICD-10-CM | POA: Diagnosis not present

## 2019-05-22 DIAGNOSIS — C49A0 Gastrointestinal stromal tumor, unspecified site: Secondary | ICD-10-CM

## 2019-05-22 DIAGNOSIS — C8581 Other specified types of non-Hodgkin lymphoma, lymph nodes of head, face, and neck: Secondary | ICD-10-CM | POA: Diagnosis not present

## 2019-05-22 DIAGNOSIS — Z8509 Personal history of malignant neoplasm of other digestive organs: Secondary | ICD-10-CM | POA: Diagnosis not present

## 2019-05-22 DIAGNOSIS — I10 Essential (primary) hypertension: Secondary | ICD-10-CM | POA: Diagnosis not present

## 2019-05-22 LAB — CBC WITH DIFFERENTIAL/PLATELET
Abs Immature Granulocytes: 0.17 10*3/uL — ABNORMAL HIGH (ref 0.00–0.07)
Basophils Absolute: 0 10*3/uL (ref 0.0–0.1)
Basophils Relative: 0 %
Eosinophils Absolute: 0 10*3/uL (ref 0.0–0.5)
Eosinophils Relative: 0 %
HCT: 37.7 % — ABNORMAL LOW (ref 39.0–52.0)
Hemoglobin: 12.5 g/dL — ABNORMAL LOW (ref 13.0–17.0)
Immature Granulocytes: 2 %
Lymphocytes Relative: 36 %
Lymphs Abs: 2.7 10*3/uL (ref 0.7–4.0)
MCH: 33.1 pg (ref 26.0–34.0)
MCHC: 33.2 g/dL (ref 30.0–36.0)
MCV: 99.7 fL (ref 80.0–100.0)
Monocytes Absolute: 1 10*3/uL (ref 0.1–1.0)
Monocytes Relative: 13 %
Neutro Abs: 3.6 10*3/uL (ref 1.7–7.7)
Neutrophils Relative %: 49 %
Platelets: 149 10*3/uL — ABNORMAL LOW (ref 150–400)
RBC: 3.78 MIL/uL — ABNORMAL LOW (ref 4.22–5.81)
RDW: 12.2 % (ref 11.5–15.5)
WBC: 7.5 10*3/uL (ref 4.0–10.5)
nRBC: 0 % (ref 0.0–0.2)

## 2019-05-22 LAB — LACTATE DEHYDROGENASE: LDH: 144 U/L (ref 98–192)

## 2019-05-22 LAB — COMPREHENSIVE METABOLIC PANEL
ALT: 16 U/L (ref 0–44)
AST: 23 U/L (ref 15–41)
Albumin: 3.9 g/dL (ref 3.5–5.0)
Alkaline Phosphatase: 68 U/L (ref 38–126)
Anion gap: 13 (ref 5–15)
BUN: 26 mg/dL — ABNORMAL HIGH (ref 8–23)
CO2: 21 mmol/L — ABNORMAL LOW (ref 22–32)
Calcium: 9.8 mg/dL (ref 8.9–10.3)
Chloride: 103 mmol/L (ref 98–111)
Creatinine, Ser: 0.88 mg/dL (ref 0.61–1.24)
GFR calc Af Amer: >60 mL/min (ref >60)
GFR calc non Af Amer: >60 mL/min (ref >60)
Glucose, Bld: 194 mg/dL — ABNORMAL HIGH (ref 70–99)
Potassium: 4.9 mmol/L (ref 3.5–5.1)
Sodium: 137 mmol/L (ref 135–145)
Total Bilirubin: 1 mg/dL (ref 0.3–1.2)
Total Protein: 7 g/dL (ref 6.5–8.1)

## 2019-05-22 LAB — PSA: Prostatic Specific Antigen: <0.01 ng/mL (ref 0.00–4.00)

## 2019-05-22 MED ORDER — DENOSUMAB 120 MG/1.7ML ~~LOC~~ SOLN
120.0000 mg | Freq: Once | SUBCUTANEOUS | Status: AC
  Administered 2019-05-22: 12:00:00 120 mg via SUBCUTANEOUS
  Filled 2019-05-22: qty 1.7

## 2019-05-22 NOTE — Progress Notes (Signed)
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  The Multi-Gene Panel offered by [facility] includes sequencing and/or deletion duplication testing of the following 80 genes: ALK, APC, ATM, AXIN2,BAP1,  BARD1, BLM, BMPR1A, BRCA1, BRCA2, BRIP1, CASR, CDC73, CDH1, CDK4, CDKN1B, CDKN1C, CDKN2A (p14ARF), CDKN2A (p16INK4a), CEBPA, CHEK2, CTNNA1, DICER1, DIS3L2, EGFR (c.2369C>T, p.Thr790Met variant only), EPCAM (Deletion/duplication testing only), FH, FLCN, GATA2, GPC3, GREM1 (Promoter region deletion/duplication testing only), HOXB13 (c.251G>A, p.Gly84Glu), HRAS, KIT, MAX, MEN1, MET, MITF (c.952G>A, p.Glu318Lys variant only), MLH1, MSH2, MSH3, MSH6, MUTYH, NBN, NF1, NF2, NTHL1, PALB2, PDGFRA, PHOX2B, PMS2, POLD1, POLE, POT1, PRKAR1A, PTCH1, PTEN, RAD50, RAD51C, RAD51D, RB1, RECQL4, RET, RUNX1, SDHAF2, SDHA (sequence changes only), SDHB, SDHC, SDHD, SMAD4, SMARCA4, SMARCB1, SMARCE1, STK11, SUFU, TERT, TERT, TMEM127, TP53, TSC1, TSC2, VHL, WRN and WT1.  The report date is February 08, 2017.    Prostate cancer ([place])  09/08/2013 Initial Diagnosis   Prostate cancer ([place])   02/08/2017 Genetic Testing   ATM Gain (Exons 62-63) VUS identified on the multi-gene panel.  The Multi-Gene Panel offered by [facility] includes sequencing and/or deletion duplication testing of the following 80 genes: ALK, APC, ATM, AXIN2,BAP1,  BARD1, BLM, BMPR1A, BRCA1, BRCA2, BRIP1, CASR, CDC73,  CDH1, CDK4, CDKN1B, CDKN1C, CDKN2A (p14ARF), CDKN2A (p16INK4a), CEBPA, CHEK2, CTNNA1, DICER1, DIS3L2, EGFR (c.2369C>T, p.Thr790Met variant only), EPCAM (Deletion/duplication testing only), FH, FLCN, GATA2, GPC3, GREM1 (Promoter region deletion/duplication testing only), HOXB13 (c.251G>A, p.Gly84Glu), HRAS, KIT, MAX, MEN1, MET, MITF (c.952G>A, p.Glu318Lys variant only), MLH1, MSH2, MSH3, MSH6, MUTYH, NBN, NF1, NF2, NTHL1, PALB2, PDGFRA, PHOX2B, PMS2, POLD1, POLE, POT1, PRKAR1A, PTCH1, PTEN, RAD50, RAD51C, RAD51D, RB1, RECQL4, RET, RUNX1, SDHAF2, SDHA (sequence changes only), SDHB, SDHC, SDHD, SMAD4, SMARCA4, SMARCB1, SMARCE1, STK11, SUFU, TERT, TERT, TMEM127, TP53, TSC1, TSC2, VHL, WRN and WT1.  The report date is February 08, 2017.       CANCER STAGING: Cancer Staging NHL (non-Hodgkin's lymphoma) (HCC) Staging form: Lymphoid Neoplasms, AJCC 6th Edition - Clinical: Stage III - Signed by [name] Cancer, PA on 08/18/2011    INTERVAL HISTORY:  Mr. Foster 86 y.o. male seen for follow-up of metastatic prostate cancer.  Tolerating Zytiga and prednisone very well.  Appetite is 25%.  Energy levels are low.  Reportedly developed an ulcer on the right great toe and is seeing wound clinic.  He had first dose of Covid vaccine and had chills and aches.  REVIEW OF SYSTEMS:  Review of Systems  Neurological: Positive for numbness.  All other systems reviewed and are negative.    PAST MEDICAL/SURGICAL HISTORY:  Past Medical History:  Diagnosis Date  . Acute bronchitis   . Arthritis   . Atrial fibrillation ([place])    Dr. Harl Foster- [name] follows saw 11'14  . Bladder stones    tx. with oral meds and antibiotics, now surgery planned  . Cataracts, both eyes    surgery planned May 2015  . Diabetes mellitus without complication ([place])    Type II  . Dyspnea    with activity  . Dysrhythmia  Afib  . Family history of breast cancer   . Family history of colon cancer   . GERD (gastroesophageal reflux  disease)   . GIST (gastrointestinal stromal tumor), malignant ([place]) 2005   Gastrointestinal stromal tumor, that is GIST, small bowel, 4.5 cm, intermediate prognostic grade found on the PET scan in the small bowel, accounting for that small bowel activity in October 2005 with resection by Dr. Margot Foster, thus far without recurrence.   . Hypertension   . NHL (non-Hodgkin's lymphoma) ([place]) 2005   Diffuse large B-cell lymphoma, clinically stage IIIA, CD20 positive, status post cervical lymph node biopsy 08/03/2003 on the left. PET scan was also positive in the spleen and small bowel region, but bone marrow aspiration and biopsy were negative. So he essentially had stage IIIAs. He received R-CHOP x6 cycles with CR established by PET scan criteria on 11/23/2003 with no evidence for relapse th  . PAD (peripheral artery disease) ([place]) 08/27/2017  . Skin cancer    Basal cell- face,head, neck,  arms, legs, Back   Past Surgical History:  Procedure Laterality Date  . AMPUTATION Left 06/29/2017   Procedure: AMPUTATION LEFT GREAT TOE;  Surgeon: [name] Dutch, MD;  Location: [facility];  Service: Vascular;  Laterality: Left;  . AMPUTATION Left 08/27/2017   Procedure: AMPUTATION Left SECOND TOE;  Surgeon: [name] Dutch, MD;  Location: [facility];  Service: Vascular;  Laterality: Left;  . CATARACT EXTRACTION Bilateral 2015  . CHOLECYSTECTOMY    . COLON RESECTION     small bowel  . CYSTOSCOPY WITH LITHOLAPAXY N/A 08/10/2013   Procedure: CYSTOSCOPY WITH LITHOLAPAXY WITH [name];  Surgeon: [name] Gallo, MD;  Location: [facility];  Service: Urology;  Laterality: N/A;  . ESOPHAGEAL DILATION N/A 02/27/2016   Procedure: ESOPHAGEAL DILATION;  Surgeon: [name] Houston, MD;  [facility];  Service: Endoscopy;  Laterality: N/A;  . ESOPHAGEAL DILATION N/A 07/07/2018   Procedure: ESOPHAGEAL DILATION;  Surgeon: [name] Houston, MD;  [facility];  Service: Endoscopy;  Laterality: N/A;  .  ESOPHAGOGASTRODUODENOSCOPY N/A 02/27/2016   Procedure: ESOPHAGOGASTRODUODENOSCOPY (EGD);  Surgeon: [name] Houston, MD;  [facility];  Service: Endoscopy;  Laterality: N/A;  12:00  . ESOPHAGOGASTRODUODENOSCOPY N/A 07/07/2018   Procedure: ESOPHAGOGASTRODUODENOSCOPY (EGD);  Surgeon: [name] Houston, MD;  [facility];  Service: Endoscopy;  Laterality: N/A;  . INCISION AND DRAINAGE PERIRECTAL ABSCESS    . LOWER EXTREMITY ANGIOGRAPHY N/A 06/25/2017   Procedure: LOWER EXTREMITY ANGIOGRAPHY;  Surgeon: [name] Dutch, MD;  Location: [facility];  Service: Cardiovascular;  Laterality: N/A;  . LYMPH NODE DISSECTION Left    '05-neck  . PORT-A-CATH REMOVAL    . PORTACATH PLACEMENT     insertion and removal -last chemotherapy 10 yrs ago  . TRANSURETHRAL RESECTION OF PROSTATE N/A 08/10/2013   Procedure: TRANSURETHRAL RESECTION OF THE PROSTATE WITH GYRUS INSTRUMENTS;  Surgeon: [name] Gallo, MD;  [facility];  Service: Urology;  Laterality: N/A;     SOCIAL HISTORY:  Social History   Socioeconomic History  . Marital status: Married    Spouse name: Not on file  . Number of children: Not on file  . Years of education: Not on file  . Highest education level: Not on file  Occupational History  . Occupation: retired    Fish farm manager: RETIRED    Comment: salesman  Tobacco Use  . Smoking status: Never Smoker  . Smokeless tobacco: Never Used  Substance and Sexual Activity  .  Alcohol use: No  . Drug use: No  . Sexual activity: Not Currently  Other Topics Concern  . Not on file  Social History Narrative  . Not on file   Social Determinants of Health   Financial Resource Strain:   . Difficulty of Paying Living Expenses: Not on file  Food Insecurity:   . Worried About Charity fundraiser in the Last Year: Not on file  . Ran Out of Food in the Last Year: Not on file  Transportation Needs:   . Lack of Transportation (Medical): Not on file  . Lack of  Transportation (Non-Medical): Not on file  Physical Activity:   . Days of Exercise per Week: Not on file  . Minutes of Exercise per Session: Not on file  Stress:   . Feeling of Stress : Not on file  Social Connections:   . Frequency of Communication with Friends and Family: Not on file  . Frequency of Social Gatherings with Friends and Family: Not on file  . Attends Religious Services: Not on file  . Active Member of Clubs or Organizations: Not on file  . Attends Archivist Meetings: Not on file  . Marital Status: Not on file  Intimate Partner Violence:   . Fear of Current or Ex-Partner: Not on file  . Emotionally Abused: Not on file  . Physically Abused: Not on file  . Sexually Abused: Not on file    FAMILY HISTORY:  Family History  Problem Relation Age of Onset  . Heart attack Mother        died in her 32s  . Other Father 55       pneumonia - died  . Breast cancer Sister        dx in her 48s-60s  . Colon cancer Brother        dx in his 42s  . Cancer Sister        TAH/BSO due to "male cancer"  . Breast cancer Daughter 72  . Breast cancer Daughter 22       DCIS - ATM VUS on [facility] 83 gene panel in 2018  . Breast cancer Daughter 93       DCIS - Negative on [facility] My[facility] 25 gene apnel in 2015  . Thyroid cancer Daughter 18       Medullary and Papillary  . Thyroid nodules Grandchild     CURRENT MEDICATIONS:  Outpatient Encounter Medications as of 05/22/2019  Medication Sig  . blood glucose meter kit and supplies Dispense based on patient and insurance preference. Use four times daily as directed. (FOR ICD-10 E10.9, E11.9).  . Calcium Polycarbophil (FIBER-CAPS PO) Take 1 capsule by mouth 2 (two) times daily.  . Cholecalciferol (VITAMIN D3) 1000 units CAPS Take 1,000 Units by mouth every morning.   . Cranberry-Vitamin C-Vitamin E (CRANBERRY PLUS VITAMIN C) 4200-20-3 MG-MG-UNIT CAPS Take 1 capsule by mouth every morning.  . diltiazem (CARDIZEM) 60 MG tablet Take  60 mg by mouth 2 (two) times daily.   . diphenhydrAMINE-APAP, sleep, (TYLENOL PM EXTRA STRENGTH PO) Take 1 tablet by mouth at bedtime.   . donepezil (ARICEPT) 10 MG tablet Take 1 tablet by mouth once daily  . feeding supplement, ENSURE ENLIVE, (ENSURE ENLIVE) LIQD Take 237 mLs by mouth daily. CHOCOLATE   . fluticasone (FLONASE) 50 MCG/ACT nasal spray Place 2 sprays into both nostrils daily.  Marland Kitchen glipiZIDE (GLUCOTROL XL) 5 MG 24 hr tablet Take 5 mg by mouth daily with breakfast.   .  glucose blood test strip Test blood glucose 1 time daily  . leuprolide (LUPRON) 11.25 MG KIT injection Inject into the muscle.  . levothyroxine (SYNTHROID) 100 MCG tablet Take 100 mcg by mouth daily before breakfast.  . lisinopril (ZESTRIL) 20 MG tablet Take 1 tablet by mouth once daily  . metFORMIN (GLUCOPHAGE) 850 MG tablet Take 850 mg by mouth 2 (two) times daily with a meal.   . mirtazapine (REMERON) 15 MG tablet Take 1 tablet (15 mg total) by mouth at bedtime.  . Multiple Vitamin (MULTIVITAMIN WITH MINERALS) TABS tablet Take 1 tablet by mouth every morning.  Marland Kitchen omeprazole (PRILOSEC) 40 MG capsule Take 40 mg by mouth every other day.   . Potassium Chloride (KLOR-CON 10 PO) Take 10 mg by mouth 2 (two) times daily.  . predniSONE (DELTASONE) 5 MG tablet Take 1/2 (one-half) tablet by mouth twice daily  . trimethoprim (TRIMPEX) 100 MG tablet Take 100 mg by mouth daily.   Marland Kitchen acetaminophen (TYLENOL) 500 MG tablet Take 500-1,000 mg by mouth every 8 (eight) hours as needed for mild pain, fever or headache.   . Cyanocobalamin (VITAMIN B-12 IJ) Inject 1,000 mcg as directed every 30 (thirty) days.   Marland Kitchen loperamide (IMODIUM) 2 MG capsule Take 1 capsule (2 mg total) by mouth as needed for diarrhea or loose stools. (Patient not taking: Reported on 03/27/2019)   No facility-administered encounter medications on file as of 05/22/2019.    ALLERGIES:  Allergies  Allergen Reactions  . Tape Other (See Comments)    SKIN IS VERY THIN AND  WILL TEAR AND BRUISE VERY EASILY!!!!  . Augmentin [Amoxicillin-Pot Clavulanate] Rash and Other (See Comments)    Redness of skin     PHYSICAL EXAM:  ECOG Performance status: 1  Vitals:   05/22/19 1113  BP: (!) 146/87  Pulse: 88  Resp: 18  Temp: (!) 97.1 F (36.2 C)  SpO2: 100%   Filed Weights   05/22/19 1113  Weight: 149 lb (67.6 kg)    Physical Exam Constitutional:      Appearance: Normal appearance. He is normal weight.  Cardiovascular:     Rate and Rhythm: Normal rate and regular rhythm.     Heart sounds: Normal heart sounds.  Pulmonary:     Effort: Pulmonary effort is normal.     Breath sounds: Normal breath sounds.  Abdominal:     General: There is no distension.     Palpations: Abdomen is soft. There is no mass.  Musculoskeletal:        General: No swelling.  Skin:    General: Skin is warm.  Neurological:     Mental Status: He is alert and oriented to person, place, and time. Mental status is at baseline.  Psychiatric:        Mood and Affect: Mood normal.        Behavior: Behavior normal.      LABORATORY DATA:  I have reviewed the labs as listed.  CBC    Component Value Date/Time   WBC 7.5 05/22/2019 1054   RBC 3.78 (L) 05/22/2019 1054   HGB 12.5 (L) 05/22/2019 1054   HCT 37.7 (L) 05/22/2019 1054   PLT 149 (L) 05/22/2019 1054   MCV 99.7 05/22/2019 1054   MCH 33.1 05/22/2019 1054   MCHC 33.2 05/22/2019 1054   RDW 12.2 05/22/2019 1054   LYMPHSABS 2.7 05/22/2019 1054   MONOABS 1.0 05/22/2019 1054   EOSABS 0.0 05/22/2019 1054   BASOSABS 0.0 05/22/2019 1054  CMP Latest Ref Rng & Units 05/22/2019 04/24/2019 03/27/2019  Glucose 70 - 99 mg/dL 194(H) 131(H) 179(H)  BUN 8 - 23 mg/dL 26(H) 27(H) 23  Creatinine 0.61 - 1.24 mg/dL 0.88 0.94 0.91  Sodium 135 - 145 mmol/L 137 140 138  Potassium 3.5 - 5.1 mmol/L 4.9 5.0 4.6  Chloride 98 - 111 mmol/L 103 106 103  CO2 22 - 32 mmol/L 21(L) 24 23  Calcium 8.9 - 10.3 mg/dL 9.8 10.5(H) 10.7(H)  Total Protein 6.5  - 8.1 g/dL 7.0 6.9 7.2  Total Bilirubin 0.3 - 1.2 mg/dL 1.0 0.7 0.7  Alkaline Phos 38 - 126 U/L 68 64 69  AST 15 - 41 U/L _0 ALT 0 - 44 U/L _1 DIAGNOSTIC IMAGING:  I have independently reviewed the scans and discussed with the patient.     ASSESSMENT & PLAN:   Prostate cancer ([place]) 1.  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 -Last bone scan on 09/01/2017 showed interval improvement in T12 lesion.  No new lesions. -Abiraterone dose reduced to 500 mg once a day and prednisone 2.5 mg twice a day on 02/10/2018. -He is continuing to tolerate abiraterone 5 mg daily and prednisone 2.5 mg daily very well. -PSA was less than 0.01 on 03/27/2019. -We reviewed his labs which are grossly within normal limits.  PSA from today is pending. -Last Lupron injection was on 03/08/2019. -He will be seen back in 2 months for follow-up.  2.  Bone strengthening: -Continue denosumab monthly.  3.  IgM MGUS: -SPEP done on 01/25/2019 showed M spike is stable at 0.4 g/dL light chain ratio of 0.91.  4.  DLBCL: -Diagnosed in 2005, he does not have any B symptoms or lymphadenopathy at this time.     Orders placed this encounter:  No orders of the defined types were placed in this encounter.     [name], MD [place] [phone]

## 2019-05-22 NOTE — Patient Instructions (Addendum)
[place] at [facility] Discharge Instructions  You were seen today by Dr. Delton Coombes. He went over your recent lab results. His numbers are looking good. He will see you back in 2 months for labs and follow up.   Thank you for choosing [facility] at [facility] to provide your oncology and hematology care.  To afford each patient quality time with our provider, please arrive at least 15 minutes before your scheduled appointment time.   If you have a lab appointment with the [facility] please come in thru the  Main Entrance and check in at the main information desk  You need to re-schedule your appointment should you arrive 10 or more minutes late.  We strive to give you quality time with our providers, and arriving late affects you and other patients whose appointments are after yours.  Also, if you no show three or more times for appointments you may be dismissed from the clinic at the providers discretion.     Again, thank you for choosing [facility].  Our hope is that these requests will decrease the amount of time that you wait before being seen by our physicians.       _____________________________________________________________  Should you have questions after your visit to [facility], please contact our office at [phone] between the hours of 8:00 a.m. and 4:30 p.m.  Voicemails left after 4:00 p.m. will not be returned until the following business day.  For prescription refill requests, have your pharmacy contact our office and allow 72 hours.    Cancer Center Support Programs:   > Cancer Support Group  2nd Tuesday of the month 1pm-2pm, Journey Room

## 2019-05-22 NOTE — Progress Notes (Signed)
Patient tolerated injection with no complaints voiced.  Site clean and dry with no bruising or swelling noted at site.  Band aid applied.  Vss with discharge and left ambulatory with no s/s of distress noted.  

## 2019-05-22 NOTE — Assessment & Plan Note (Signed)
1.  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 -Last bone scan on 09/01/2017 showed interval improvement in T12 lesion.  No new lesions. -Abiraterone dose reduced to 500 mg once a day and prednisone 2.5 mg twice a day on 02/10/2018. -He is continuing to tolerate abiraterone 5 mg daily and prednisone 2.5 mg daily very well. -PSA was less than 0.01 on 03/27/2019. -We reviewed his labs which are grossly within normal limits.  PSA from today is pending. -Last Lupron injection was on 03/08/2019. -He will be seen back in 2 months for follow-up.  2.  Bone strengthening: -Continue denosumab monthly.  3.  IgM MGUS: -SPEP done on 01/25/2019 showed M spike is stable at 0.4 g/dL light chain ratio of 0.91.  4.  DLBCL: -Diagnosed in 2005, he does not have any B symptoms or lymphadenopathy at this time.

## 2019-05-23 ENCOUNTER — Encounter (HOSPITAL_BASED_OUTPATIENT_CLINIC_OR_DEPARTMENT_OTHER): Payer: Medicare Other | Admitting: Internal Medicine

## 2019-05-30 ENCOUNTER — Encounter (HOSPITAL_BASED_OUTPATIENT_CLINIC_OR_DEPARTMENT_OTHER): Payer: Medicare Other | Attending: Internal Medicine | Admitting: Internal Medicine

## 2019-05-30 ENCOUNTER — Other Ambulatory Visit: Payer: Self-pay

## 2019-05-30 ENCOUNTER — Encounter (HOSPITAL_BASED_OUTPATIENT_CLINIC_OR_DEPARTMENT_OTHER): Payer: Medicare Other | Admitting: Internal Medicine

## 2019-05-30 DIAGNOSIS — E11621 Type 2 diabetes mellitus with foot ulcer: Secondary | ICD-10-CM | POA: Diagnosis not present

## 2019-05-30 DIAGNOSIS — E1142 Type 2 diabetes mellitus with diabetic polyneuropathy: Secondary | ICD-10-CM | POA: Insufficient documentation

## 2019-05-30 DIAGNOSIS — E1151 Type 2 diabetes mellitus with diabetic peripheral angiopathy without gangrene: Secondary | ICD-10-CM | POA: Insufficient documentation

## 2019-05-30 DIAGNOSIS — B351 Tinea unguium: Secondary | ICD-10-CM | POA: Insufficient documentation

## 2019-05-30 DIAGNOSIS — Z8546 Personal history of malignant neoplasm of prostate: Secondary | ICD-10-CM | POA: Diagnosis not present

## 2019-05-30 DIAGNOSIS — Z8572 Personal history of non-Hodgkin lymphomas: Secondary | ICD-10-CM | POA: Diagnosis not present

## 2019-05-30 DIAGNOSIS — I10 Essential (primary) hypertension: Secondary | ICD-10-CM | POA: Insufficient documentation

## 2019-05-30 DIAGNOSIS — I4891 Unspecified atrial fibrillation: Secondary | ICD-10-CM | POA: Insufficient documentation

## 2019-05-30 DIAGNOSIS — L97511 Non-pressure chronic ulcer of other part of right foot limited to breakdown of skin: Secondary | ICD-10-CM | POA: Diagnosis not present

## 2019-05-30 DIAGNOSIS — C7951 Secondary malignant neoplasm of bone: Secondary | ICD-10-CM | POA: Insufficient documentation

## 2019-05-30 DIAGNOSIS — Z7901 Long term (current) use of anticoagulants: Secondary | ICD-10-CM | POA: Diagnosis not present

## 2019-05-30 DIAGNOSIS — Z89422 Acquired absence of other left toe(s): Secondary | ICD-10-CM | POA: Insufficient documentation

## 2019-05-30 DIAGNOSIS — S91101A Unspecified open wound of right great toe without damage to nail, initial encounter: Secondary | ICD-10-CM | POA: Diagnosis not present

## 2019-05-30 NOTE — Progress Notes (Addendum)
TERRI, KODAMA ([id number]) Visit Report for 05/30/2019 Arrival Information Details Patient Name: Date of Service: GILES, SHAYNE 05/30/2019 10:15 AM Medical Record [id number] Patient Account Number: [id number] Date of Birth/Sex: Treating RN: 09-19-1923 (86 y.o. [name] Primary Care Morrill Bomkamp: Benny Lennert Other Clinician: Referring Amyla Heffner: Treating Eldridge Marcott/Extender:Robson, Shayne Alken, LISA Weeks in Treatment: 4 Visit Information History Since Last Visit Added or deleted any medications: No Patient Arrived: Wheel Chair Any new allergies or adverse reactions: No Arrival Time: 11:08 Had a fall or experienced change in No activities of daily living that may affect Accompanied By: daughter risk of falls: Transfer Assistance: None Signs or symptoms of abuse/neglect since last No Patient Identification Verified: Yes visito Secondary Verification Process Completed: Yes Hospitalized since last visit: No Patient Requires Transmission-Based No Implantable device outside of the clinic excluding No Precautions: cellular tissue based products placed in the center Patient Has Alerts: No since last visit: Has Dressing in Place as Prescribed: Yes Pain Present Now: No Electronic Signature(s) Signed: 05/30/2019 5:18:10 PM By: Baruch Gouty RN, BSN Entered By: Baruch Gouty on 05/30/2019 11:08:38 -------------------------------------------------------------------------------- Clinic Level of Care Assessment Details Patient Name: Date of Service: NAM, REISH 05/30/2019 10:15 AM Medical Record [id number] Patient Account Number: [id number] Date of Birth/Sex: Treating RN: Aug 27, 1923 (86 y.o. [name]) Carlene Coria Primary Care Malissie Musgrave: Benny Lennert Other Clinician: Referring Elmira Olkowski: Treating Larry Knipp/Extender:Robson, Shayne Alken, LISA Weeks in Treatment: 4 Clinic Level of Care Assessment Items TOOL 4 Quantity Score X - Use when only an EandM is performed on  FOLLOW-UP visit 1 0 ASSESSMENTS - Nursing Assessment / Reassessment X - Reassessment of Co-morbidities (includes updates in patient status) 1 10 X - Reassessment of Adherence to Treatment Plan 1 5 ASSESSMENTS - Wound and Skin Assessment / Reassessment X - Simple Wound Assessment / Reassessment - one wound 1 5 []  - Complex Wound Assessment / Reassessment - multiple wounds 0 []  - Dermatologic / Skin Assessment (not related to wound area) 0 ASSESSMENTS - Focused Assessment []  - Circumferential Edema Measurements - multi extremities 0 []  - Nutritional Assessment / Counseling / Intervention 0 []  - Lower Extremity Assessment (monofilament, tuning fork, pulses) 0 []  - Peripheral Arterial Disease Assessment (using hand held doppler) 0 ASSESSMENTS - Ostomy and/or Continence Assessment and Care []  - Incontinence Assessment and Management 0 []  - Ostomy Care Assessment and Management (repouching, etc.) 0 PROCESS - Coordination of Care X - Simple Patient / Family Education for ongoing care 1 15 []  - Complex (extensive) Patient / Family Education for ongoing care 0 X - Staff obtains Programmer, systems, Records, Test Results / Process Orders 1 10 []  - Staff telephones HHA, Nursing Homes / Clarify orders / etc 0 []  - Routine Transfer to another Facility (non-emergent condition) 0 []  - Routine Hospital Admission (non-emergent condition) 0 []  - New Admissions / Biomedical engineer / Ordering NPWT, Apligraf, etc. 0 []  - Emergency Hospital Admission (emergent condition) 0 X - Simple Discharge Coordination 1 10 []  - Complex (extensive) Discharge Coordination 0 PROCESS - Special Needs []  - Pediatric / Minor Patient Management 0 []  - Isolation Patient Management 0 []  - Hearing / Language / Visual special needs 0 []  - Assessment of Community assistance (transportation, D/C planning, etc.) 0 []  - Additional assistance / Altered mentation 0 []  - Support Surface(s) Assessment (bed, cushion, seat, etc.)  0 INTERVENTIONS - Wound Cleansing / Measurement X - Simple Wound Cleansing - one wound 1 5 []  - Complex Wound Cleansing - multiple wounds 0 X - Wound  Imaging (photographs - any number of wounds) 1 5 []  - Wound Tracing (instead of photographs) 0 X - Simple Wound Measurement - one wound 1 5 []  - Complex Wound Measurement - multiple wounds 0 INTERVENTIONS - Wound Dressings X - Small Wound Dressing one or multiple wounds 1 10 []  - Medium Wound Dressing one or multiple wounds 0 []  - Large Wound Dressing one or multiple wounds 0 []  - Application of Medications - topical 0 []  - Application of Medications - injection 0 INTERVENTIONS - Miscellaneous []  - External ear exam 0 []  - Specimen Collection (cultures, biopsies, blood, body fluids, etc.) 0 []  - Specimen(s) / Culture(s) sent or taken to Lab for analysis 0 []  - Patient Transfer (multiple staff / Civil Service fast streamer / Similar devices) 0 []  - Simple Staple / Suture removal (25 or less) 0 []  - Complex Staple / Suture removal (26 or more) 0 []  - Hypo / Hyperglycemic Management (close monitor of Blood Glucose) 0 []  - Ankle / Brachial Index (ABI) - do not check if billed separately 0 X - Vital Signs 1 5 Has the patient been seen at the hospital within the last three years: Yes Total Score: 85 Level Of Care: New/Established - Level 3 Electronic Signature(s) Signed: 05/30/2019 5:19:34 PM By: Carlene Coria RN Entered By: Carlene Coria on 05/30/2019 11:52:00 -------------------------------------------------------------------------------- Encounter Discharge Information Details Patient Name: Date of Service: [name] 05/30/2019 10:15 AM Medical Record [id number] Patient Account Number: [id number] Date of Birth/Sex: Treating RN: [birth year] (86 y.o. [name] Primary Care Ulysse Siemen: Benny Lennert Other Clinician: Referring Haeleigh Streiff: Treating Lowen Barringer/Extender:Robson, Shayne Alken, LISA Weeks in Treatment: 4 Encounter Discharge  Information Items Discharge Condition: Stable Ambulatory Status: Wheelchair Discharge Destination: Home Transportation: Private Auto Accompanied By: daughter Schedule Follow-up Appointment: Yes Clinical Summary of Care: Patient Declined Electronic Signature(s) Signed: 05/30/2019 5:16:17 PM By: [name] Entered By: [name] on 05/30/2019 12:14:24 -------------------------------------------------------------------------------- Lower Extremity Assessment Details Patient Name: Date of Service: DRAYLON, FEBRES 05/30/2019 10:15 AM Medical Record [id number] Patient Account Number: [id number] Date of Birth/Sex: Treating RN: [birth year] (86 y.o. [name] Primary Care Kingston Shawgo: Benny Lennert Other Clinician: Referring Javontae Marlette: Treating Corley Maffeo/Extender:Robson, Shayne Alken, LISA Weeks in Treatment: 4 Edema Assessment Assessed: [Left: No] [Right: No] Edema: [Left: N] [Right: o] Calf Left: Right: Point of Measurement: cm From Medial Instep cm 30 cm Ankle Left: Right: Point of Measurement: cm From Medial Instep cm 20.5 cm Vascular Assessment Pulses: Dorsalis Pedis Palpable: [Right:No] Electronic Signature(s) Signed: 05/30/2019 5:18:10 PM By: Baruch Gouty RN, BSN Entered By: Baruch Gouty on 05/30/2019 11:16:35 -------------------------------------------------------------------------------- Multi Wound Chart Details Patient Name: Date of Service: [name] 05/30/2019 10:15 AM Medical Record [id number] Patient Account Number: [id number] Date of Birth/Sex: Treating RN: [birth year] (86 y.o. [name]) Carlene Coria Primary Care Imaad Reuss: Benny Lennert Other Clinician: Referring Gerry Heaphy: Treating Fayette Gasner/Extender:Robson, Shayne Alken, LISA Weeks in Treatment: 4 Vital Signs Height(in): 70 Capillary Blood 155 Glucose(mg/dl): Weight(lbs): 150 Pulse(bpm): 42 Body Mass Index(BMI): 22 Blood Pressure(mmHg): 131/62 Temperature(F): 98.4 Respiratory  18 Rate(breaths/min): Photos: [1:No Photos] [N/A:N/A] Wound Location: [1:Right Toe Great] [N/A:N/A] Wounding Event: [1:Gradually Appeared] [N/A:N/A] Primary Etiology: [1:Diabetic Wound/Ulcer of the N/A Lower Extremity] Comorbid History: [1:Cataracts, Arrhythmia, Hypertension, Peripheral Arterial Disease, Type II Diabetes, Osteomyelitis, Dementia, Neuropathy, Received Chemotherapy] [N/A:N/A] Date Acquired: [1:02/28/2019] [N/A:N/A] Weeks of Treatment: [1:4] [N/A:N/A] Wound Status: [1:Open] [N/A:N/A] Measurements L x W x D 0.2x0.3x0.1 [N/A:N/A] (cm) Area (cm) : [1:0.047] [N/A:N/A] Volume (cm) : [1:0.005] [N/A:N/A] % Reduction in Area: [1:25.40%] [N/A:N/A] % Reduction  in Volume: 16.70% [N/A:N/A] Classification: [1:Grade 1] [N/A:N/A] Exudate Amount: [1:None Present] [N/A:N/A] Wound Margin: [1:Thickened] [N/A:N/A] Granulation Amount: [1:Large (67-100%)] [N/A:N/A] Granulation Quality: [1:Red] [N/A:N/A] Necrotic Amount: [1:None Present (0%)] [N/A:N/A] Exposed Structures: [1:Fat Layer (Subcutaneous N/A Tissue) Exposed: Yes Fascia: No Tendon: No Muscle: No Joint: No Bone: No Medium (34-66%)] [N/A:N/A] Treatment Notes Wound #1 (Right Toe Great) 1. Cleanse With Wound Cleanser 2. Periwound Care Skin Prep 3. Primary Dressing Applied Calcium Alginate Ag 4. Secondary Dressing Dry Gauze Notes toe size stretch net, surgical shoe placed on. Electronic Signature(s) Signed: 05/30/2019 5:08:08 PM By: Linton Ham MD Signed: 05/30/2019 5:19:34 PM By: Carlene Coria RN Entered By: Linton Ham on 05/30/2019 14:36:45 -------------------------------------------------------------------------------- Multi-Disciplinary Care Plan Details Patient Name: Date of Service: LOUAY, TENNESSEN 05/30/2019 10:15 AM Medical Record [id number] Patient Account Number: [id number] Date of Birth/Sex: Treating RN: 09-Apr-1924 (86 y.o. [name]) Carlene Coria Primary Care Tavish Gettis: Benny Lennert Other Clinician: Referring  Kajah Santizo: Treating Jessika Rothery/Extender:Robson, Shayne Alken, LISA Weeks in Treatment: 4 Active Inactive Wound/Skin Impairment Nursing Diagnoses: Knowledge deficit related to ulceration/compromised skin integrity Goals: Patient/caregiver will verbalize understanding of skin care regimen Date Initiated: 05/02/2019 Target Resolution Date: 06/02/2019 Goal Status: Active Ulcer/skin breakdown will have a volume reduction of 30% by week 4 Date Initiated: 05/02/2019 Target Resolution Date: 06/02/2019 Goal Status: Active Interventions: Assess patient/caregiver ability to obtain necessary supplies Assess patient/caregiver ability to perform ulcer/skin care regimen upon admission and as needed Assess ulceration(s) every visit Notes: Electronic Signature(s) Signed: 05/30/2019 5:19:34 PM By: Carlene Coria RN Entered By: Carlene Coria on 05/30/2019 11:10:41 -------------------------------------------------------------------------------- Pain Assessment Details Patient Name: Date of Service: MUSA, SORICH 05/30/2019 10:15 AM Medical Record [id number] Patient Account Number: [id number] Date of Birth/Sex: Treating RN: 1923/06/18 (86 y.o. [name] Primary Care Maxwell Lemen: Benny Lennert Other Clinician: Referring Callyn Severtson: Treating Tayia Stonesifer/Extender:Robson, Shayne Alken, LISA Weeks in Treatment: 4 Active Problems Location of Pain Severity and Description of Pain Patient Has Paino No Site Locations Rate the pain. Current Pain Level: 0 Pain Management and Medication Current Pain Management: Electronic Signature(s) Signed: 05/30/2019 5:18:10 PM By: Baruch Gouty RN, BSN Entered By: Baruch Gouty on 05/30/2019 11:10:34 -------------------------------------------------------------------------------- Patient/Caregiver Education Details Patient Name: Date of Service: [name] 2/9/2021andnbsp10:15 AM Medical Record [id number] Patient Account Number: [id number] Date of  Birth/Gender: 06/20/1923 (86 y.o. M) Treating RN: Carlene Coria Primary Care Physician: Benny Lennert Other Clinician: Referring Physician: Treating Physician/Extender:Robson, Shayne Alken, Mart Piggs in Treatment: 4 Education Assessment Education Provided To: Patient Education Topics Provided Wound/Skin Impairment: Methods: Explain/Verbal Responses: State content correctly Electronic Signature(s) Signed: 05/30/2019 5:19:34 PM By: Carlene Coria RN Entered By: Carlene Coria on 05/30/2019 11:10:54 -------------------------------------------------------------------------------- Wound Assessment Details Patient Name: Date of Service: OZAIR, PONTING 05/30/2019 10:15 AM Medical Record [id number] Patient Account Number: [id number] Date of Birth/Sex: Treating RN: July 23, 1923 (86 y.o. [name]) Carlene Coria Primary Care Rayhana Slider: Benny Lennert Other Clinician: Referring Shanikia Kernodle: Treating Melah Ebling/Extender:Robson, Shayne Alken, LISA Weeks in Treatment: 4 Wound Status Wound Number: 1 Primary Diabetic Wound/Ulcer of the Lower Extremity Etiology: Wound Location: Right Toe Great Wound Open Wounding Event: Gradually Appeared Status: Date Acquired: 02/28/2019 Comorbid Cataracts, Arrhythmia, Hypertension, Weeks Of Treatment: 4 History: Peripheral Arterial Disease, Type II Diabetes, Clustered Wound: No Osteomyelitis, Dementia, Neuropathy, Received Chemotherapy Photos Wound Measurements Length: (cm) 0.2 % Reduct Width: (cm) 0.3 % Reduct Depth: (cm) 0.1 Epitheli Area: (cm) 0.047 Tunneli Volume: (cm) 0.005 Undermi Wound Description Classification: Grade 1 Wound Margin: Thickened Exudate Amount: None Present Wound Bed Granulation Amount: Large (67-100%) Granulation Quality: Red Necrotic  Amount: None Present (0%) Foul Odor After Cleansing: No Slough/Fibrino Yes Exposed Structure Fascia Exposed: No Fat Layer (Subcutaneous Tissue) Exposed: Yes Tendon Exposed: No Muscle Exposed:  No Joint Exposed: No Bone Exposed: No ion in Area: 25.4% ion in Volume: 16.7% alization: Medium (34-66%) ng: No ning: No Treatment Notes Wound #1 (Right Toe Great) 1. Cleanse With Wound Cleanser 2. Periwound Care Skin Prep 3. Primary Dressing Applied Calcium Alginate Ag 4. Secondary Dressing Dry Gauze Notes toe size stretch net, surgical shoe placed on. Electronic Signature(s) Signed: 05/31/2019 4:31:42 PM By: Mikeal Hawthorne EMT/HBOT Signed: 05/31/2019 4:53:37 PM By: Carlene Coria RN Previous Signature: 05/30/2019 5:18:10 PM Version By: Baruch Gouty RN, BSN Entered By: Mikeal Hawthorne on 05/31/2019 09:47:57 -------------------------------------------------------------------------------- Vitals Details Patient Name: Date of Service: COLEN, RENEE 05/30/2019 10:15 AM Medical Record [id number] Patient Account Number: [id number] Date of Birth/Sex: Treating RN: 1923-04-24 (86 y.o. [name] Primary Care Kenyatte Gruber: Benny Lennert Other Clinician: Referring Gal Smolinski: Treating Kaliana Albino/Extender:Robson, Shayne Alken, LISA Weeks in Treatment: 4 Vital Signs Time Taken: 11:08 Temperature (F): 98.4 Height (in): 70 Pulse (bpm): 83 Source: Stated Respiratory Rate (breaths/min): 18 Weight (lbs): 150 Blood Pressure (mmHg): 131/62 Source: Stated Capillary Blood Glucose (mg/dl): 155 Body Mass Index (BMI): 21.5 Reference Range: 80 - 120 mg / dl Notes glucose per pt report this am Electronic Signature(s) Signed: 05/30/2019 5:18:10 PM By: Baruch Gouty RN, BSN Entered By: Baruch Gouty on 05/30/2019 11:09:30

## 2019-05-30 NOTE — Progress Notes (Signed)
KENY, ARMAND ([id number]) Visit Report for 05/30/2019 HPI Details Patient Name: Date of Service: AWS, MALLARE 05/30/2019 10:15 AM Medical Record [id number] Patient Account Number: [id number] Date of Birth/Sex: Treating RN: 06-Jun-1923 (86 y.o. [name]) Carlene Coria Primary Care Provider: Benny Lennert Other Clinician: Referring Provider: Treating Provider/Extender:Ezzie Senat, Shayne Alken, LISA Weeks in Treatment: 4 History of Present Illness HPI Description: ADMISSION 05/02/2019 This is a 86 year old man accompanied by his daughter. He lives in [place] with his elderly wife. He has home caregivers as well. Apparently on February 28, 2019 they noted blood on his sock and noticed a wound on the tip of his right great toe. He has been cared for by Dr. Posey Pronto who is his podiatrist in [place]. Apparently they have been using Betadine to this area. The patient has a thick mycotic nail on the right as well which is embedded in the wound in some areas. Fortunately the patient has diabetic neuropathy and is not really experiencing any pain otherwise I think this would all be quite painful. The patient was previously treated in 2019 for wounds on the left foot. This included a fairly thorough vascular review by vein and vascular Dr. Oneida Alar. His ABIs at that time were noncompressible bilaterally TBI on the right was 0.88 with biphasic waveforms on the right and monophasic waveforms at the left. He ended up having amputations of the left first and second toes. He did have an angiogram on 06/25/2017 which showed on the right the right common femoral profundofemoral for Morris and superficial femoral arteries were widely patent the right popliteal was widely patent. Tibial vessels were not well opacified secondary to motion artifact however he was felt to have all 3 tibial vessels intact with some calcification and some areas of stenosis within the posterior tibial artery but relatively  good flow to the right lower leg. He also had the left reviewed as well which was the source of the problem at that time. Past medical history; prostate cancer on oral antiandrogen therapy with bone mets, GI stromal tumor in 2005, type 2 diabetes with peripheral neuropathy, hypertension, atrial fibrillation on Eliquis, history of non-Hodgkin's lymphoma treated with CHOP in 2005. He had amputations of the left first and second toes secondary to osteomyelitis. Does not appear that he has had an x-ray of the right foot ABI in our clinic on the right was noncompressible [see full arterial discussion above]. 1/19; area on the tip of the right great toe under a very mycotic nail tip. We use silver alginate. 1/26; area of the tip of the right great toe under a very mycotic nail. A lot of this seems to have closed down we have been using silver alginate 2/9; the areas on the tip of the right great toe under a very mycotic nail. A lot of this continues to close down. I do not think there is a subungual problem. I am hopeful that this mycotic nail which is thick and raised will allow full epithelialization of the wound Electronic Signature(s) Signed: 05/30/2019 5:08:08 PM By: Linton Ham MD Entered By: Linton Ham on 05/30/2019 14:37:27 -------------------------------------------------------------------------------- Physical Exam Details Patient Name: Date of Service: JOHNCARLO, LAXSON 05/30/2019 10:15 AM Medical Record [id number] Patient Account Number: [id number] Date of Birth/Sex: Treating RN: [birth year] (86 y.o. [name] Primary Care Provider: Benny Lennert Other Clinician: Referring Provider: Treating Provider/Extender:Clair Alfieri, Shayne Alken, LISA Weeks in Treatment: 4 Constitutional Sitting or standing Blood Pressure is within target range for patient.. Pulse regular and within  target range for patient.Marland Kitchen Respirations regular, non-labored and within target range.. Temperature is  normal and within the target range for the patient.Marland Kitchen Appears in no distress. Cardiovascular He has palpable pulses. Notes Wound exam; only a small wound remains at the tip of this patient's thick mycotic nail. The tissue looks healthy here. I had some thoughts about knocking the nail back from the edge of the wound but I did not do this today. Electronic Signature(s) Signed: 05/30/2019 5:08:08 PM By: Linton Ham MD Entered By: Linton Ham on 05/30/2019 14:38:26 -------------------------------------------------------------------------------- Physician Orders Details Patient Name: Date of Service: ANTWANE, VONHOLTEN 05/30/2019 10:15 AM Medical Record [id number] Patient Account Number: [id number] Date of Birth/Sex: Treating RN: Jan 04, 1924 (86 y.o. [name]) Carlene Coria Primary Care Provider: Benny Lennert Other Clinician: Referring Provider: Treating Provider/Extender:Basia Mcginty, Shayne Alken, LISA Weeks in Treatment: 4 Verbal / Phone Orders: No Diagnosis Coding ICD-10 Coding Code Description E11.621 Type 2 diabetes mellitus with foot ulcer L97.511 Non-pressure chronic ulcer of other part of right foot limited to breakdown of skin E11.42 Type 2 diabetes mellitus with diabetic polyneuropathy E11.51 Type 2 diabetes mellitus with diabetic peripheral angiopathy without gangrene B35.1 Tinea unguium Follow-up Appointments Return Appointment in 2 weeks. Dressing Change Frequency Change dressing every day. Wound Cleansing Clean wound with Normal Saline. May shower and wash wound with soap and water. Primary Wound Dressing Calcium Alginate with Silver Secondary Dressing Dry Gauze - secure with tape Off-Loading Other: - SURGICAL SANDLE TO [facility] skilled nursing for wound care. - [facility] Electronic Signature(s) Signed: 05/30/2019 5:08:08 PM By: Linton Ham MD Signed: 05/30/2019 5:19:34 PM By: Carlene Coria RN Entered By: Carlene Coria on 05/30/2019  11:10:33 -------------------------------------------------------------------------------- Problem List Details Patient Name: Date of Service: SOPHEAK, KISSLING 05/30/2019 10:15 AM Medical Record [id number] Patient Account Number: [id number] Date of Birth/Sex: Treating RN: [birth year] (86 y.o. [name]) Carlene Coria Primary Care Provider: Benny Lennert Other Clinician: Referring Provider: Treating Provider/Extender:Weronika Birch, Shayne Alken, LISA Weeks in Treatment: 4 Active Problems ICD-10 Evaluated Encounter Code Description Active Date Today Diagnosis E11.621 Type 2 diabetes mellitus with foot ulcer 05/02/2019 No Yes L97.511 Non-pressure chronic ulcer of other part of right foot 05/02/2019 No Yes limited to breakdown of skin E11.42 Type 2 diabetes mellitus with diabetic polyneuropathy 05/02/2019 No Yes E11.51 Type 2 diabetes mellitus with diabetic peripheral 05/02/2019 No Yes angiopathy without gangrene B35.1 Tinea unguium 05/02/2019 No Yes Inactive Problems Resolved Problems Electronic Signature(s) Signed: 05/30/2019 5:08:08 PM By: Linton Ham MD Entered By: Linton Ham on 05/30/2019 14:34:48 -------------------------------------------------------------------------------- Progress Note Details Patient Name: Date of Service: [name] 05/30/2019 10:15 AM Medical Record [id number] Patient Account Number: [id number] Date of Birth/Sex: Treating RN: Apr 13, 1924 (86 y.o. [name] Primary Care Provider: Benny Lennert Other Clinician: Referring Provider: Treating Provider/Extender:Sione Baumgarten, Shayne Alken, LISA Weeks in Treatment: 4 Subjective History of Present Illness (HPI) ADMISSION 05/02/2019 This is a 86 year old man accompanied by his daughter. He lives in [place] with his elderly wife. He has home caregivers as well. Apparently on February 28, 2019 they noted blood on his sock and noticed a wound on the tip of his right great toe. He has been cared  for by Dr. Posey Pronto who is his podiatrist in [place]. Apparently they have been using Betadine to this area. The patient has a thick mycotic nail on the right as well which is embedded in the wound in some areas. Fortunately the patient has diabetic neuropathy and is not really experiencing any pain otherwise  I think this would all be quite painful. The patient was previously treated in 2019 for wounds on the left foot. This included a fairly thorough vascular review by vein and vascular Dr. Oneida Alar. His ABIs at that time were noncompressible bilaterally TBI on the right was 0.88 with biphasic waveforms on the right and monophasic waveforms at the left. He ended up having amputations of the left first and second toes. He did have an angiogram on 06/25/2017 which showed on the right the right common femoral profundofemoral for Morris and superficial femoral arteries were widely patent the right popliteal was widely patent. Tibial vessels were not well opacified secondary to motion artifact however he was felt to have all 3 tibial vessels intact with some calcification and some areas of stenosis within the posterior tibial artery but relatively good flow to the right lower leg. He also had the left reviewed as well which was the source of the problem at that time. Past medical history; prostate cancer on oral antiandrogen therapy with bone mets, GI stromal tumor in 2005, type 2 diabetes with peripheral neuropathy, hypertension, atrial fibrillation on Eliquis, history of non-Hodgkin's lymphoma treated with CHOP in 2005. He had amputations of the left first and second toes secondary to osteomyelitis. Does not appear that he has had an x-ray of the right foot ABI in our clinic on the right was noncompressible [see full arterial discussion above]. 1/19; area on the tip of the right great toe under a very mycotic nail tip. We use silver alginate. 1/26; area of the tip of the right great toe under a very  mycotic nail. A lot of this seems to have closed down we have been using silver alginate 2/9; the areas on the tip of the right great toe under a very mycotic nail. A lot of this continues to close down. I do not think there is a subungual problem. I am hopeful that this mycotic nail which is thick and raised will allow full epithelialization of the wound Objective Constitutional Sitting or standing Blood Pressure is within target range for patient.. Pulse regular and within target range for patient.Marland Kitchen Respirations regular, non-labored and within target range.. Temperature is normal and within the target range for the patient.Marland Kitchen Appears in no distress. Vitals Time Taken: 11:08 AM, Height: 70 in, Source: Stated, Weight: 150 lbs, Source: Stated, BMI: 21.5, Temperature: 98.4 F, Pulse: 83 bpm, Respiratory Rate: 18 breaths/min, Blood Pressure: 131/62 mmHg, Capillary Blood Glucose: 155 mg/dl. General Notes: glucose per pt report this am Cardiovascular He has palpable pulses. General Notes: Wound exam; only a small wound remains at the tip of this patient's thick mycotic nail. The tissue looks healthy here. I had some thoughts about knocking the nail back from the edge of the wound but I did not do this today. Integumentary (Hair, Skin) Wound #1 status is Open. Original cause of wound was Gradually Appeared. The wound is located on the Right Toe Great. The wound measures 0.2cm length x 0.3cm width x 0.1cm depth; 0.047cm^2 area and 0.005cm^3 volume. There is Fat Layer (Subcutaneous Tissue) Exposed exposed. There is no tunneling or undermining noted. There is a none present amount of drainage noted. The wound margin is thickened. There is large (67-100%) red granulation within the wound bed. There is no necrotic tissue within the wound bed. Assessment Active Problems ICD-10 Type 2 diabetes mellitus with foot ulcer Non-pressure chronic ulcer of other part of right foot limited to breakdown of  skin Type 2 diabetes mellitus  with diabetic polyneuropathy Type 2 diabetes mellitus with diabetic peripheral angiopathy without gangrene Tinea unguium Plan Follow-up Appointments: Return Appointment in 2 weeks. Dressing Change Frequency: Change dressing every day. Wound Cleansing: Clean wound with Normal Saline. May shower and wash wound with soap and water. Primary Wound Dressing: Calcium Alginate with Silver Secondary Dressing: Dry Gauze - secure with tape Off-Loading: Other: - SURGICAL SANDLE TO RIGHT FOOT Home Health: [facility]. - [facility] 1. I did not see any reason to change the primary dressing which is silver alginate. 2. I had some thoughts about knocking the nail back from the wound area but I did not do this today but will consider it if this does not heal Electronic Signature(s) Signed: 05/30/2019 5:08:08 PM By: Linton Ham MD Entered By: Linton Ham on 05/30/2019 14:39:09 -------------------------------------------------------------------------------- SuperBill Details Patient Name: Date of Service: [name] 05/30/2019 Medical Record [id number] Patient Account Number: [id number] Date of Birth/Sex: Treating RN: [birth year] (86 y.o. [name]) Carlene Coria Primary Care Provider: Benny Lennert Other Clinician: Referring Provider: Treating Provider/Extender:Miken Stecher, Shayne Alken, LISA Weeks in Treatment: 4 Diagnosis Coding ICD-10 Codes Code Description E11.621 Type 2 diabetes mellitus with foot ulcer L97.511 Non-pressure chronic ulcer of other part of right foot limited to breakdown of skin E11.42 Type 2 diabetes mellitus with diabetic polyneuropathy E11.51 Type 2 diabetes mellitus with diabetic peripheral angiopathy without gangrene B35.1 Tinea unguium Facility Procedures CPT4 Code: [id number] Description: [id number] - WOUND CARE VISIT-LEV 3 EST PT Modifier: Quantity: 1 Physician Procedures CPT4 Code Description:  [id number] - WC PHYS LEVEL 2 - EST PT ICD-10 Diagnosis Description L97.511 Non-pressure chronic ulcer of other part of right foot limit E11.621 Type 2 diabetes mellitus with foot ulcer Modifier: ed to breakdow Quantity: 1 n of skin Electronic Signature(s) Signed: 05/30/2019 5:08:08 PM By: Linton Ham MD Entered By: Linton Ham on 05/30/2019 14:39:32

## 2019-05-31 DIAGNOSIS — C8581 Other specified types of non-Hodgkin lymphoma, lymph nodes of head, face, and neck: Secondary | ICD-10-CM | POA: Diagnosis not present

## 2019-05-31 DIAGNOSIS — L97519 Non-pressure chronic ulcer of other part of right foot with unspecified severity: Secondary | ICD-10-CM | POA: Diagnosis not present

## 2019-05-31 DIAGNOSIS — I10 Essential (primary) hypertension: Secondary | ICD-10-CM | POA: Diagnosis not present

## 2019-05-31 DIAGNOSIS — R269 Unspecified abnormalities of gait and mobility: Secondary | ICD-10-CM | POA: Diagnosis not present

## 2019-05-31 DIAGNOSIS — C61 Malignant neoplasm of prostate: Secondary | ICD-10-CM | POA: Diagnosis not present

## 2019-05-31 DIAGNOSIS — E1165 Type 2 diabetes mellitus with hyperglycemia: Secondary | ICD-10-CM | POA: Diagnosis not present

## 2019-06-02 DIAGNOSIS — C61 Malignant neoplasm of prostate: Secondary | ICD-10-CM | POA: Diagnosis not present

## 2019-06-02 DIAGNOSIS — R269 Unspecified abnormalities of gait and mobility: Secondary | ICD-10-CM | POA: Diagnosis not present

## 2019-06-02 DIAGNOSIS — I10 Essential (primary) hypertension: Secondary | ICD-10-CM | POA: Diagnosis not present

## 2019-06-02 DIAGNOSIS — L97519 Non-pressure chronic ulcer of other part of right foot with unspecified severity: Secondary | ICD-10-CM | POA: Diagnosis not present

## 2019-06-02 DIAGNOSIS — E1165 Type 2 diabetes mellitus with hyperglycemia: Secondary | ICD-10-CM | POA: Diagnosis not present

## 2019-06-02 DIAGNOSIS — C8581 Other specified types of non-Hodgkin lymphoma, lymph nodes of head, face, and neck: Secondary | ICD-10-CM | POA: Diagnosis not present

## 2019-06-05 ENCOUNTER — Ambulatory Visit: Payer: Medicare Other | Admitting: Neurology

## 2019-06-06 DIAGNOSIS — R269 Unspecified abnormalities of gait and mobility: Secondary | ICD-10-CM | POA: Diagnosis not present

## 2019-06-06 DIAGNOSIS — C61 Malignant neoplasm of prostate: Secondary | ICD-10-CM | POA: Diagnosis not present

## 2019-06-06 DIAGNOSIS — L97519 Non-pressure chronic ulcer of other part of right foot with unspecified severity: Secondary | ICD-10-CM | POA: Diagnosis not present

## 2019-06-06 DIAGNOSIS — I10 Essential (primary) hypertension: Secondary | ICD-10-CM | POA: Diagnosis not present

## 2019-06-06 DIAGNOSIS — C8581 Other specified types of non-Hodgkin lymphoma, lymph nodes of head, face, and neck: Secondary | ICD-10-CM | POA: Diagnosis not present

## 2019-06-06 DIAGNOSIS — E1165 Type 2 diabetes mellitus with hyperglycemia: Secondary | ICD-10-CM | POA: Diagnosis not present

## 2019-06-12 DIAGNOSIS — B351 Tinea unguium: Secondary | ICD-10-CM | POA: Diagnosis not present

## 2019-06-12 DIAGNOSIS — E1142 Type 2 diabetes mellitus with diabetic polyneuropathy: Secondary | ICD-10-CM | POA: Diagnosis not present

## 2019-06-13 ENCOUNTER — Telehealth: Payer: Self-pay | Admitting: Family Medicine

## 2019-06-13 ENCOUNTER — Other Ambulatory Visit: Payer: Self-pay

## 2019-06-13 ENCOUNTER — Encounter (HOSPITAL_BASED_OUTPATIENT_CLINIC_OR_DEPARTMENT_OTHER): Payer: Medicare Other | Admitting: Internal Medicine

## 2019-06-13 DIAGNOSIS — E1151 Type 2 diabetes mellitus with diabetic peripheral angiopathy without gangrene: Secondary | ICD-10-CM | POA: Diagnosis not present

## 2019-06-13 DIAGNOSIS — E1142 Type 2 diabetes mellitus with diabetic polyneuropathy: Secondary | ICD-10-CM | POA: Diagnosis not present

## 2019-06-13 DIAGNOSIS — B351 Tinea unguium: Secondary | ICD-10-CM | POA: Diagnosis not present

## 2019-06-13 DIAGNOSIS — E1140 Type 2 diabetes mellitus with diabetic neuropathy, unspecified: Secondary | ICD-10-CM

## 2019-06-13 DIAGNOSIS — L97511 Non-pressure chronic ulcer of other part of right foot limited to breakdown of skin: Secondary | ICD-10-CM | POA: Diagnosis not present

## 2019-06-13 DIAGNOSIS — I10 Essential (primary) hypertension: Secondary | ICD-10-CM | POA: Diagnosis not present

## 2019-06-13 DIAGNOSIS — S91101A Unspecified open wound of right great toe without damage to nail, initial encounter: Secondary | ICD-10-CM | POA: Diagnosis not present

## 2019-06-13 DIAGNOSIS — E11621 Type 2 diabetes mellitus with foot ulcer: Secondary | ICD-10-CM | POA: Diagnosis not present

## 2019-06-13 MED ORDER — EASYMAX TEST VI STRP: ORAL_STRIP | 12 refills | Status: DC

## 2019-06-13 MED ORDER — LANCETS MISC: 1.0000 [IU] | Freq: Two times a day (BID) | 12 refills | Status: DC

## 2019-06-13 NOTE — Progress Notes (Signed)
TEXAS, HUTTNER ([id number]) Visit Report for 06/13/2019 HPI Details Patient Name: Date of Service: [name], [name] 06/13/2019 2:30 PM Medical Record [id number] Patient Account Number: [id number] Date of Birth/Sex: Treating RN: Sep 11, 1923 (86 y.o. [name]) Carlene Coria Primary Care Provider: Benny Lennert Other Clinician: Referring Provider: Treating Provider/Extender:Onie Hayashi, Shayne Alken, LISA Weeks in Treatment: 6 History of Present Illness HPI Description: ADMISSION 05/02/2019 This is a 86 year old man accompanied by his daughter. He lives in [place] with his elderly wife. He has home caregivers as well. Apparently on February 28, 2019 they noted blood on his sock and noticed a wound on the tip of his right great toe. He has been cared for by Dr. Posey Pronto who is his podiatrist in [place]. Apparently they have been using Betadine to this area. The patient has a thick mycotic nail on the right as well which is embedded in the wound in some areas. Fortunately the patient has diabetic neuropathy and is not really experiencing any pain otherwise I think this would all be quite painful. The patient was previously treated in 2019 for wounds on the left foot. This included a fairly thorough vascular review by vein and vascular Dr. Oneida Alar. His ABIs at that time were noncompressible bilaterally TBI on the right was 0.88 with biphasic waveforms on the right and monophasic waveforms at the left. He ended up having amputations of the left first and second toes. He did have an angiogram on 06/25/2017 which showed on the right the right common femoral profundofemoral for Morris and superficial femoral arteries were widely patent the right popliteal was widely patent. Tibial vessels were not well opacified secondary to motion artifact however he was felt to have all 3 tibial vessels intact with some calcification and some areas of stenosis within the posterior tibial artery but relatively  good flow to the right lower leg. He also had the left reviewed as well which was the source of the problem at that time. Past medical history; prostate cancer on oral antiandrogen therapy with bone mets, GI stromal tumor in 2005, type 2 diabetes with peripheral neuropathy, hypertension, atrial fibrillation on Eliquis, history of non-Hodgkin's lymphoma treated with CHOP in 2005. He had amputations of the left first and second toes secondary to osteomyelitis. Does not appear that he has had an x-ray of the right foot ABI in our clinic on the right was noncompressible [see full arterial discussion above]. 1/19; area on the tip of the right great toe under a very mycotic nail tip. We use silver alginate. 1/26; area of the tip of the right great toe under a very mycotic nail. A lot of this seems to have closed down we have been using silver alginate 2/9; the areas on the tip of the right great toe under a very mycotic nail. A lot of this continues to close down. I do not think there is a subungual problem. I am hopeful that this mycotic nail which is thick and raised will allow full epithelialization of the wound 2/23; this was a difficult area on the tip of his right great toe. Thick mycotic nail over the base of the wound. This appears to have closed down now. The nail itself is thick split with considerable subungual debris. Electronic Signature(s) Signed: 06/13/2019 6:00:34 PM By: Linton Ham MD Entered By: Linton Ham on 06/13/2019 15:48:56 -------------------------------------------------------------------------------- Physical Exam Details Patient Name: Date of Service: Foster, [name] 06/13/2019 2:30 PM Medical Record [id number] Patient Account Number: [id number] Date of Birth/Sex: Treating RN:  1923/05/12 (86 y.o. [name] Primary Care Provider: Benny Lennert Other Clinician: Referring Provider: Treating Provider/Extender:Cherlynn Popiel, Shayne Alken, LISA Weeks in  Treatment: 6 Constitutional Patient is hypertensive.. Pulse regular and within target range for patient.Marland Kitchen Respirations regular, non-labored and within target range.. Temperature is normal and within the target range for the patient.Marland Kitchen Appears in no distress. Cardiovascular Dorsalis pedis pulses palpable. Notes Wound exam; no open wound is observed. Using a #3 curette I poked in some of the debris but I could not identify any open area. I had some thoughts about whether this nail would be better off removed. He has peripheral pulses palpable however I understand anybody's reluctance to do this. Electronic Signature(s) Signed: 06/13/2019 6:00:34 PM By: Linton Ham MD Entered By: Linton Ham on 06/13/2019 15:50:04 -------------------------------------------------------------------------------- Physician Orders Details Patient Name: Date of Service: Foster, [name] 06/13/2019 2:30 PM Medical Record [id number] Patient Account Number: [id number] Date of Birth/Sex: Treating RN: 27-May-1923 (86 y.o. [name]) Carlene Coria Primary Care Provider: Benny Lennert Other Clinician: Referring Provider: Treating Provider/Extender:Trishia Cuthrell, Shayne Alken, LISA Weeks in Treatment: 6 Verbal / Phone Orders: No Diagnosis Coding ICD-10 Coding Code Description E11.621 Type 2 diabetes mellitus with foot ulcer L97.511 Non-pressure chronic ulcer of other part of right foot limited to breakdown of skin E11.42 Type 2 diabetes mellitus with diabetic polyneuropathy E11.51 Type 2 diabetes mellitus with diabetic peripheral angiopathy without gangrene B35.1 Tinea unguium Discharge From [facility] Discharge from [place] Signature(s) Signed: 06/13/2019 6:00:34 PM By: Linton Ham MD Signed: 06/13/2019 6:06:02 PM By: Carlene Coria RN Entered By: Carlene Coria on 06/13/2019 15:38:26 -------------------------------------------------------------------------------- Problem List Details Patient  Name: Date of Service: [name] 06/13/2019 2:30 PM Medical Record [id number] Patient Account Number: [id number] Date of Birth/Sex: Treating RN: May 31, 1923 (86 y.o. [name] Primary Care Provider: Benny Lennert Other Clinician: Referring Provider: Treating Provider/Extender:Jillianna Stanek, Shayne Alken, LISA Weeks in Treatment: 6 Active Problems ICD-10 Evaluated Encounter Code Description Active Date Today Diagnosis E11.621 Type 2 diabetes mellitus with foot ulcer 05/02/2019 No Yes L97.511 Non-pressure chronic ulcer of other part of right foot 05/02/2019 No Yes limited to breakdown of skin E11.42 Type 2 diabetes mellitus with diabetic polyneuropathy 05/02/2019 No Yes E11.51 Type 2 diabetes mellitus with diabetic peripheral 05/02/2019 No Yes angiopathy without gangrene B35.1 Tinea unguium 05/02/2019 No Yes Inactive Problems Resolved Problems Electronic Signature(s) Signed: 06/13/2019 6:00:34 PM By: Linton Ham MD Entered By: Linton Ham on 06/13/2019 15:47:41 -------------------------------------------------------------------------------- Progress Note Details Patient Name: Date of Service: [name] 06/13/2019 2:30 PM Medical Record [id number] Patient Account Number: [id number] Date of Birth/Sex: Treating RN: [birth year] (86 y.o. [name] Primary Care Provider: Benny Lennert Other Clinician: Referring Provider: Treating Provider/Extender:Brittian Renaldo, Shayne Alken, LISA Weeks in Treatment: 6 Subjective History of Present Illness (HPI) ADMISSION 05/02/2019 This is a 86 year old man accompanied by his daughter. He lives in [place] with his elderly wife. He has home caregivers as well. Apparently on February 28, 2019 they noted blood on his sock and noticed a wound on the tip of his right great toe. He has been cared for by Dr. Posey Pronto who is his podiatrist in [place]. Apparently they have been using Betadine to this area. The  patient has a thick mycotic nail on the right as well which is embedded in the wound in some areas. Fortunately the patient has diabetic neuropathy and is not really experiencing any pain otherwise I think this would all be quite painful. The patient was previously treated in 2019  for wounds on the left foot. This included a fairly thorough vascular review by vein and vascular Dr. Oneida Alar. His ABIs at that time were noncompressible bilaterally TBI on the right was 0.88 with biphasic waveforms on the right and monophasic waveforms at the left. He ended up having amputations of the left first and second toes. He did have an angiogram on 06/25/2017 which showed on the right the right common femoral profundofemoral for Morris and superficial femoral arteries were widely patent the right popliteal was widely patent. Tibial vessels were not well opacified secondary to motion artifact however he was felt to have all 3 tibial vessels intact with some calcification and some areas of stenosis within the posterior tibial artery but relatively good flow to the right lower leg. He also had the left reviewed as well which was the source of the problem at that time. Past medical history; prostate cancer on oral antiandrogen therapy with bone mets, GI stromal tumor in 2005, type 2 diabetes with peripheral neuropathy, hypertension, atrial fibrillation on Eliquis, history of non-Hodgkin's lymphoma treated with CHOP in 2005. He had amputations of the left first and second toes secondary to osteomyelitis. Does not appear that he has had an x-ray of the right foot ABI in our clinic on the right was noncompressible [see full arterial discussion above]. 1/19; area on the tip of the right great toe under a very mycotic nail tip. We use silver alginate. 1/26; area of the tip of the right great toe under a very mycotic nail. A lot of this seems to have closed down we have been using silver alginate 2/9; the areas on the tip  of the right great toe under a very mycotic nail. A lot of this continues to close down. I do not think there is a subungual problem. I am hopeful that this mycotic nail which is thick and raised will allow full epithelialization of the wound 2/23; this was a difficult area on the tip of his right great toe. Thick mycotic nail over the base of the wound. This appears to have closed down now. The nail itself is thick split with considerable subungual debris. Objective Constitutional Patient is hypertensive.. Pulse regular and within target range for patient.Marland Kitchen Respirations regular, non-labored and within target range.. Temperature is normal and within the target range for the patient.Marland Kitchen Appears in no distress. Vitals Time Taken: 3:03 PM, Height: 70 in, Weight: 150 lbs, BMI: 21.5, Temperature: 97.7 F, Pulse: 73 bpm, Respiratory Rate: 18 breaths/min, Blood Pressure: 157/83 mmHg, Capillary Blood Glucose: 130 mg/dl. Cardiovascular Dorsalis pedis pulses palpable. General Notes: Wound exam; no open wound is observed. Using a #3 curette I poked in some of the debris but I could not identify any open area. I had some thoughts about whether this nail would be better off removed. He has peripheral pulses palpable however I understand anybody's reluctance to do this. Integumentary (Hair, Skin) Wound #1 status is Healed - Epithelialized. Original cause of wound was Gradually Appeared. The wound is located on the Right Toe Great. The wound measures 0cm length x 0cm width x 0cm depth; 0cm^2 area and 0cm^3 volume. There is no tunneling or undermining noted. There is a none present amount of drainage noted. The wound margin is thickened. There is no granulation within the wound bed. There is no necrotic tissue within the wound bed. Assessment Active Problems ICD-10 Type 2 diabetes mellitus with foot ulcer Non-pressure chronic ulcer of other part of right foot limited to breakdown of  skin Type 2 diabetes  mellitus with diabetic polyneuropathy Type 2 diabetes mellitus with diabetic peripheral angiopathy without gangrene Tinea unguium Plan Discharge From [facility] Services: Discharge from [place] 1. The patient can be discharged from the wound care center 2. I advised keeping this toe tip padded in his shoes. 3. Follow-up with Dr. Posey Pronto in [place] the nail itself is splitting I wonder if this is eventually going to have to be removed not withstanding the risk of nonhealing Electronic Signature(s) Signed: 06/13/2019 6:00:34 PM By: Linton Ham MD Entered By: Linton Ham on 06/13/2019 15:50:46 -------------------------------------------------------------------------------- SuperBill Details Patient Name: Date of Service: [name] 06/13/2019 Medical Record [id number] Patient Account Number: [id number] Date of Birth/Sex: Treating RN: 1923-05-31 (86 y.o. [name]) Carlene Coria Primary Care Provider: Benny Lennert Other Clinician: Referring Provider: Treating Provider/Extender:Maeci Kalbfleisch, Shayne Alken, LISA Weeks in Treatment: 6 Diagnosis Coding ICD-10 Codes Code Description E11.621 Type 2 diabetes mellitus with foot ulcer L97.511 Non-pressure chronic ulcer of other part of right foot limited to breakdown of skin E11.42 Type 2 diabetes mellitus with diabetic polyneuropathy E11.51 Type 2 diabetes mellitus with diabetic peripheral angiopathy without gangrene B35.1 Tinea unguium Facility Procedures CPT4 Code: [id number] Description: [id number] - WOUND CARE VISIT-LEV 3 EST PT Modifier: Quantity: 1 Physician Procedures CPT4 Code Description: [id number] - WC PHYS LEVEL 3 - EST PT ICD-10 Diagnosis Description E11.621 Type 2 diabetes mellitus with foot ulcer L97.511 Non-pressure chronic ulcer of other part of right foot limi Modifier: ted to breakdo Quantity: 1 wn of skin Electronic Signature(s) Signed: 06/13/2019 6:00:34 PM By: Linton Ham MD Entered By: Linton Ham on  06/13/2019 15:50:59

## 2019-06-13 NOTE — Telephone Encounter (Signed)
Patient daughter called and states the patient is needing diabetic supply refills called into pharamcy.  Easy Max test strips   Sterilance Soft Twist Lancets  [facility] - [place], [place] Phone:  [phone]  Fax:  [phone]

## 2019-06-13 NOTE — Telephone Encounter (Signed)
Patient daughter called and said the prescription is for 2x a day but needs to be 1x a day so that Medicare will pay for it since patient is not insulin dependant.

## 2019-06-13 NOTE — Telephone Encounter (Signed)
Sent in

## 2019-06-14 ENCOUNTER — Telehealth: Payer: Self-pay | Admitting: Cardiology

## 2019-06-14 ENCOUNTER — Other Ambulatory Visit: Payer: Self-pay

## 2019-06-14 ENCOUNTER — Other Ambulatory Visit (HOSPITAL_COMMUNITY): Payer: Self-pay | Admitting: Surgery

## 2019-06-14 DIAGNOSIS — E1140 Type 2 diabetes mellitus with diabetic neuropathy, unspecified: Secondary | ICD-10-CM

## 2019-06-14 MED ORDER — LANCETS MISC: 1.0000 [IU] | 12 refills | Status: AC

## 2019-06-14 MED ORDER — GLUCOSE BLOOD VI STRP: ORAL_STRIP | 12 refills | Status: DC

## 2019-06-14 MED ORDER — PREDNISONE 5 MG PO TABS: ORAL_TABLET | ORAL | 0 refills | Status: DC

## 2019-06-14 NOTE — Telephone Encounter (Signed)
Pt's daughter is calling to see if we have received the pt's Eliquis yet?

## 2019-06-14 NOTE — Telephone Encounter (Signed)
I called pharmacy this morning to update.

## 2019-06-14 NOTE — Telephone Encounter (Signed)
Rx resent.

## 2019-06-14 NOTE — Telephone Encounter (Signed)
[name] is calling from [place] and states she needs a order stating the below. Faxed to [phone]

## 2019-06-15 ENCOUNTER — Other Ambulatory Visit: Payer: Self-pay | Admitting: Cardiology

## 2019-06-15 DIAGNOSIS — E1165 Type 2 diabetes mellitus with hyperglycemia: Secondary | ICD-10-CM | POA: Diagnosis not present

## 2019-06-15 DIAGNOSIS — R269 Unspecified abnormalities of gait and mobility: Secondary | ICD-10-CM | POA: Diagnosis not present

## 2019-06-15 DIAGNOSIS — L97519 Non-pressure chronic ulcer of other part of right foot with unspecified severity: Secondary | ICD-10-CM | POA: Diagnosis not present

## 2019-06-15 DIAGNOSIS — I10 Essential (primary) hypertension: Secondary | ICD-10-CM | POA: Diagnosis not present

## 2019-06-15 DIAGNOSIS — C61 Malignant neoplasm of prostate: Secondary | ICD-10-CM | POA: Diagnosis not present

## 2019-06-15 DIAGNOSIS — C8581 Other specified types of non-Hodgkin lymphoma, lymph nodes of head, face, and neck: Secondary | ICD-10-CM | POA: Diagnosis not present

## 2019-06-15 NOTE — Telephone Encounter (Signed)
Returned pt call. Spoke with [name] (pt's daughter) She is questioning her father's approval for Eliquis. I informed her that the nurse doing pt assistance is not in office, but I will have her call her back on Monday. Daughter voiced understanding of plan.

## 2019-06-16 ENCOUNTER — Ambulatory Visit: Payer: Medicare Other | Admitting: Podiatry

## 2019-06-16 ENCOUNTER — Encounter: Payer: Self-pay | Admitting: Cardiology

## 2019-06-16 ENCOUNTER — Ambulatory Visit (INDEPENDENT_AMBULATORY_CARE_PROVIDER_SITE_OTHER): Payer: Medicare Other | Admitting: Cardiology

## 2019-06-16 VITALS — BP 133/72 | HR 80 | Temp 98.3°F | Ht 70.0 in | Wt 147.0 lb

## 2019-06-16 DIAGNOSIS — I10 Essential (primary) hypertension: Secondary | ICD-10-CM | POA: Diagnosis not present

## 2019-06-16 DIAGNOSIS — I4891 Unspecified atrial fibrillation: Secondary | ICD-10-CM | POA: Diagnosis not present

## 2019-06-16 DIAGNOSIS — I350 Nonrheumatic aortic (valve) stenosis: Secondary | ICD-10-CM

## 2019-06-16 NOTE — Patient Instructions (Signed)

## 2019-06-16 NOTE — Progress Notes (Signed)
Clinical Summary Mr. Macneal is a 86 y.o.male seen today for follow up of the following medical problems.   1. Atrial fibrillation    - no recent palpitations.  - compliant with meds - no bleeding on eliquis.   2. HTN  -compliant with meds  3. Prostate cancer with bone mets - followed by onc  4. Dizziness - mild orthostatic symptoms with standing previously - working to maintain hydration   - no recent symptoms    5. Mild aortic stenosis - by echo 01/2018 - no recent symptoms  -no SOB or DOE.    6. Foot sore - followed by wound care - followed by podiatrist.  - partial amputation  SH: his wife Abdulwahab Demelo is also a patient of mine.   Had covid vaccine x 2 at [facility].   Past Medical History:  Diagnosis Date  . Acute bronchitis   . Arthritis   . Atrial fibrillation ([place])    Dr. Harl Bowie- [facility] follows saw 11'14  . Bladder stones    tx. with oral meds and antibiotics, now surgery planned  . Cataracts, both eyes    surgery planned May 2015  . Diabetes mellitus without complication ([place])    Type II  . Dyspnea    with activity  . Dysrhythmia    Afib  . Family history of breast cancer   . Family history of colon cancer   . GERD (gastroesophageal reflux disease)   . 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 Margot Chimes, thus far without recurrence.   . Hypertension   . NHL (non-Hodgkin's lymphoma) ([place]) 2005   Diffuse large B-cell lymphoma, clinically stage IIIA, CD20 positive, status post cervical lymph node biopsy 08/03/2003 on the left. PET scan was also positive in the spleen and small bowel region, but bone marrow aspiration and biopsy were negative. So he essentially had stage IIIAs. He received  R-CHOP x6 cycles with CR established by PET scan criteria on 11/23/2003 with no evidence for relapse th  . PAD (peripheral artery disease) ([place]) 08/27/2017  . Skin cancer    Basal cell- face,head, neck,  arms, legs, Back     Allergies  Allergen Reactions  . Tape Other (See Comments)    SKIN IS VERY THIN AND WILL TEAR AND BRUISE VERY EASILY!!!!  . Augmentin [Amoxicillin-Pot Clavulanate] Rash and Other (See Comments)    Redness of skin     Current Outpatient Medications  Medication Sig Dispense Refill  . acetaminophen (TYLENOL) 500 MG tablet Take 500-1,000 mg by mouth every 8 (eight) hours as needed for mild pain, fever or headache.     . blood glucose meter kit and supplies Dispense based on patient and insurance preference. Use four times daily as directed. (FOR ICD-10 E10.9, E11.9). 1 each 0  . Calcium Polycarbophil (FIBER-CAPS PO) Take 1 capsule by mouth 2 (two) times daily.    . Cholecalciferol (VITAMIN D3) 1000 units CAPS Take 1,000 Units by mouth every morning.     . Cranberry-Vitamin C-Vitamin E (CRANBERRY PLUS VITAMIN C) 4200-20-3 MG-MG-UNIT CAPS Take 1 capsule by mouth every morning.    . Cyanocobalamin (VITAMIN B-12 IJ) Inject 1,000 mcg as directed every 30 (thirty) days.     Marland Kitchen diltiazem (CARDIZEM) 60 MG tablet Take 60  mg by mouth 2 (two) times daily.     . diphenhydrAMINE-APAP, sleep, (TYLENOL PM EXTRA STRENGTH PO) Take 1 tablet by mouth at bedtime.     . donepezil (ARICEPT) 10 MG tablet Take 1 tablet by mouth once daily 90 tablet 0  . feeding supplement, ENSURE ENLIVE, (ENSURE ENLIVE) LIQD Take 237 mLs by mouth daily. CHOCOLATE     . fluticasone (FLONASE) 50 MCG/ACT nasal spray Place 2 sprays into both nostrils daily.    Marland Kitchen glipiZIDE (GLUCOTROL XL) 5 MG 24 hr tablet Take 5 mg by mouth daily with breakfast.     . glucose blood (EASYMAX TEST) test strip Use as instructed 100 each 12  . glucose blood test strip Test blood glucose 1 time daily 100 each 12  . leuprolide (LUPRON)  11.25 MG KIT injection Inject into the muscle.    . levothyroxine (SYNTHROID) 100 MCG tablet Take 100 mcg by mouth daily before breakfast.    . lisinopril (ZESTRIL) 20 MG tablet Take 1 tablet by mouth once daily 90 tablet 3  . loperamide (IMODIUM) 2 MG capsule Take 1 capsule (2 mg total) by mouth as needed for diarrhea or loose stools. (Patient not taking: Reported on 03/27/2019) 30 capsule 0  . metFORMIN (GLUCOPHAGE) 850 MG tablet Take 850 mg by mouth 2 (two) times daily with a meal.     . mirtazapine (REMERON) 15 MG tablet Take 1 tablet (15 mg total) by mouth at bedtime. 30 tablet 0  . Multiple Vitamin (MULTIVITAMIN WITH MINERALS) TABS tablet Take 1 tablet by mouth every morning.    Marland Kitchen omeprazole (PRILOSEC) 40 MG capsule Take 40 mg by mouth every other day.   3  . Potassium Chloride (KLOR-CON 10 PO) Take 10 mg by mouth 2 (two) times daily.    . predniSONE (DELTASONE) 5 MG tablet Take 1/2 (one-half) tablet by mouth twice daily 90 tablet 0  . trimethoprim (TRIMPEX) 100 MG tablet Take 100 mg by mouth daily.      No current facility-administered medications for this visit.     Past Surgical History:  Procedure Laterality Date  . AMPUTATION Left 06/29/2017   Procedure: AMPUTATION LEFT GREAT TOE;  Surgeon: [name], MD;  Location: [place];  Service: Vascular;  Laterality: Left;  . AMPUTATION Left 08/27/2017   Procedure: AMPUTATION Left SECOND TOE;  Surgeon: [name], MD;  Location: [place];  Service: Vascular;  Laterality: Left;  . CATARACT EXTRACTION Bilateral 2015  . CHOLECYSTECTOMY    . COLON RESECTION     small bowel  . CYSTOSCOPY WITH LITHOLAPAXY N/A 08/10/2013   Procedure: CYSTOSCOPY WITH LITHOLAPAXY WITH [name];  Surgeon: [name], MD;  Location[facility];  Service: Urology;  Laterality: N/A;  . ESOPHAGEAL DILATION N/A 02/27/2016   Procedure: ESOPHAGEAL DILATION;  Surgeon: [name], MD;  [facility];  Service: Endoscopy;  Laterality: N/A;  .  ESOPHAGEAL DILATION N/A 07/07/2018   Procedure: ESOPHAGEAL DILATION;  Surgeon: [name], MD;  [facility];  Service: Endoscopy;  Laterality: N/A;  . ESOPHAGOGASTRODUODENOSCOPY N/A 02/27/2016   Procedure: ESOPHAGOGASTRODUODENOSCOPY (EGD);  Surgeon: [name], MD;  [facility];  Service: Endoscopy;  Laterality: N/A;  12:00  . ESOPHAGOGASTRODUODENOSCOPY N/A 07/07/2018   Procedure: ESOPHAGOGASTRODUODENOSCOPY (EGD);  Surgeon: [name], MD;  [facility];  Service: Endoscopy;  Laterality: N/A;  . INCISION AND DRAINAGE PERIRECTAL ABSCESS    . LOWER EXTREMITY ANGIOGRAPHY N/A 06/25/2017  Procedure: LOWER EXTREMITY ANGIOGRAPHY;  Surgeon: [name], MD;  Location: [place] [facility];  Service: Cardiovascular;  Laterality: N/A;  . LYMPH NODE DISSECTION Left    '05-neck  . PORT-A-CATH REMOVAL    . PORTACATH PLACEMENT     insertion and removal -last chemotherapy 10 yrs ago  . TRANSURETHRAL RESECTION OF PROSTATE N/A 08/10/2013   Procedure: TRANSURETHRAL RESECTION OF THE PROSTATE WITH GYRUS INSTRUMENTS;  Surgeon: [name], MD;  Location[facility];  Service: Urology;  Laterality: N/A;     Allergies  Allergen Reactions  . Tape Other (See Comments)    SKIN IS VERY THIN AND WILL TEAR AND BRUISE VERY EASILY!!!!  . Augmentin [Amoxicillin-Pot Clavulanate] Rash and Other (See Comments)    Redness of skin      Family History  Problem Relation Age of Onset  . Heart attack Mother        died in her 48s  . Other Father 56       pneumonia - died  . Breast cancer Sister        dx in her 73s-60s  . Colon cancer Brother        dx in his 42s  . Cancer Sister        TAH/BSO due to "male cancer"  . Breast cancer Daughter 72  . Breast cancer Daughter 30       DCIS - ATM VUS on [employer] 83 gene panel in 2018  . Breast cancer Daughter 66       DCIS - Negative on [employer] MyRisk 25 gene apnel in 2015  . Thyroid cancer Daughter 30        Medullary and Papillary  . Thyroid nodules Grandchild      Social History Mr. Crutchfield reports that he has never smoked. He has never used smokeless tobacco. Mr. Bornemann reports no history of alcohol use.   Review of Systems CONSTITUTIONAL: No weight loss, fever, chills, weakness or fatigue.  HEENT: Eyes: No visual loss, blurred vision, double vision or yellow sclerae.No hearing loss, sneezing, congestion, runny nose or sore throat.  SKIN: No rash or itching.  CARDIOVASCULAR: per hpi RESPIRATORY: No shortness of breath, cough or sputum.  GASTROINTESTINAL: No anorexia, nausea, vomiting or diarrhea. No abdominal pain or blood.  GENITOURINARY: No burning on urination, no polyuria NEUROLOGICAL: No headache, dizziness, syncope, paralysis, ataxia, numbness or tingling in the extremities. No change in bowel or bladder control.  MUSCULOSKELETAL: No muscle, back pain, joint pain or stiffness.  LYMPHATICS: No enlarged nodes. No history of splenectomy.  PSYCHIATRIC: No history of depression or anxiety.  ENDOCRINOLOGIC: No reports of sweating, cold or heat intolerance. No polyuria or polydipsia.  Marland Kitchen   Physical Examination Today's Vitals   06/16/19 0959  BP: 133/72  Pulse: 80  Temp: 98.3 F (36.8 C)  SpO2: 95%  Weight: 147 lb (66.7 kg)  Height: _0  (1.778 m)   Body mass index is 21.09 kg/m.  Gen: resting comfortably, no acute distress HEENT: no scleral icterus, pupils equal round and reactive, no palptable cervical adenopathy,  CV: irreg, 3/6 systolic murmur rusb, no jvd Resp: Clear to auscultation bilaterally GI: abdomen is soft, non-tender, non-distended, normal bowel sounds, no hepatosplenomegaly MSK: extremities are warm, no edema.  Skin: warm, no rash Neuro:  no focal deficits Psych: appropriate affect   Diagnostic Studies  01/2018 echo Study Conclusions  - Left ventricle: The cavity size was normal. Wall thickness was increased in a pattern of mild LVH.  Systolic  function was normal. The estimated ejection fraction was in the range of 55% to 60%. Wall motion was normal; there were no regional wall motion abnormalities. The study was not technically sufficient to allow evaluation of LV diastolic dysfunction due to atrial fibrillation. - Aortic valve: Moderately calcified annulus. Trileaflet; mildly thickened, mildly calcified leaflets. Morphologically, there was at least mild calcific aortic stenosis if not mild to moderate stenosis. - Mitral valve: There was mild regurgitation. - Inferior vena cava: The vessel was dilated. The respirophasic diameter changes were blunted (<50%), consistent with elevated central venous pressure. Estimated CVP 15 mmHg.   Assessment and Plan  1. Afib  -no symptoms, continue current meds  2. HTN  - at goal, ciontinue current meds  3. Aortic stenosis - mild, we will continue to monitor.    F/u 6 months      Arnoldo Lenis, M.D.

## 2019-06-17 DIAGNOSIS — Z89422 Acquired absence of other left toe(s): Secondary | ICD-10-CM | POA: Diagnosis not present

## 2019-06-17 DIAGNOSIS — I4821 Permanent atrial fibrillation: Secondary | ICD-10-CM | POA: Diagnosis not present

## 2019-06-17 DIAGNOSIS — E43 Unspecified severe protein-calorie malnutrition: Secondary | ICD-10-CM | POA: Diagnosis not present

## 2019-06-17 DIAGNOSIS — Z7901 Long term (current) use of anticoagulants: Secondary | ICD-10-CM | POA: Diagnosis not present

## 2019-06-17 DIAGNOSIS — C61 Malignant neoplasm of prostate: Secondary | ICD-10-CM | POA: Diagnosis not present

## 2019-06-17 DIAGNOSIS — Z7984 Long term (current) use of oral hypoglycemic drugs: Secondary | ICD-10-CM | POA: Diagnosis not present

## 2019-06-17 DIAGNOSIS — C8581 Other specified types of non-Hodgkin lymphoma, lymph nodes of head, face, and neck: Secondary | ICD-10-CM | POA: Diagnosis not present

## 2019-06-17 DIAGNOSIS — R269 Unspecified abnormalities of gait and mobility: Secondary | ICD-10-CM | POA: Diagnosis not present

## 2019-06-17 DIAGNOSIS — M6281 Muscle weakness (generalized): Secondary | ICD-10-CM | POA: Diagnosis not present

## 2019-06-17 DIAGNOSIS — I10 Essential (primary) hypertension: Secondary | ICD-10-CM | POA: Diagnosis not present

## 2019-06-17 DIAGNOSIS — L97519 Non-pressure chronic ulcer of other part of right foot with unspecified severity: Secondary | ICD-10-CM | POA: Diagnosis not present

## 2019-06-17 DIAGNOSIS — E1165 Type 2 diabetes mellitus with hyperglycemia: Secondary | ICD-10-CM | POA: Diagnosis not present

## 2019-06-19 ENCOUNTER — Other Ambulatory Visit: Payer: Self-pay

## 2019-06-19 ENCOUNTER — Encounter (HOSPITAL_COMMUNITY): Payer: Self-pay

## 2019-06-19 ENCOUNTER — Inpatient Hospital Stay (HOSPITAL_COMMUNITY): Payer: Medicare Other

## 2019-06-19 ENCOUNTER — Inpatient Hospital Stay (HOSPITAL_COMMUNITY): Payer: Medicare Other | Attending: Hematology

## 2019-06-19 VITALS — BP 121/79 | HR 90 | Temp 97.3°F | Resp 24

## 2019-06-19 DIAGNOSIS — I4891 Unspecified atrial fibrillation: Secondary | ICD-10-CM | POA: Insufficient documentation

## 2019-06-19 DIAGNOSIS — Z7952 Long term (current) use of systemic steroids: Secondary | ICD-10-CM | POA: Insufficient documentation

## 2019-06-19 DIAGNOSIS — M1990 Unspecified osteoarthritis, unspecified site: Secondary | ICD-10-CM | POA: Diagnosis not present

## 2019-06-19 DIAGNOSIS — R197 Diarrhea, unspecified: Secondary | ICD-10-CM | POA: Diagnosis not present

## 2019-06-19 DIAGNOSIS — E875 Hyperkalemia: Secondary | ICD-10-CM | POA: Diagnosis not present

## 2019-06-19 DIAGNOSIS — I10 Essential (primary) hypertension: Secondary | ICD-10-CM | POA: Diagnosis not present

## 2019-06-19 DIAGNOSIS — Z7984 Long term (current) use of oral hypoglycemic drugs: Secondary | ICD-10-CM | POA: Insufficient documentation

## 2019-06-19 DIAGNOSIS — M79606 Pain in leg, unspecified: Secondary | ICD-10-CM | POA: Diagnosis not present

## 2019-06-19 DIAGNOSIS — Z803 Family history of malignant neoplasm of breast: Secondary | ICD-10-CM | POA: Insufficient documentation

## 2019-06-19 DIAGNOSIS — C49A0 Gastrointestinal stromal tumor, unspecified site: Secondary | ICD-10-CM

## 2019-06-19 DIAGNOSIS — R1030 Lower abdominal pain, unspecified: Secondary | ICD-10-CM | POA: Diagnosis not present

## 2019-06-19 DIAGNOSIS — R5383 Other fatigue: Secondary | ICD-10-CM | POA: Diagnosis not present

## 2019-06-19 DIAGNOSIS — Z85828 Personal history of other malignant neoplasm of skin: Secondary | ICD-10-CM | POA: Diagnosis not present

## 2019-06-19 DIAGNOSIS — K219 Gastro-esophageal reflux disease without esophagitis: Secondary | ICD-10-CM | POA: Diagnosis not present

## 2019-06-19 DIAGNOSIS — C61 Malignant neoplasm of prostate: Secondary | ICD-10-CM

## 2019-06-19 DIAGNOSIS — C7951 Secondary malignant neoplasm of bone: Secondary | ICD-10-CM | POA: Insufficient documentation

## 2019-06-19 DIAGNOSIS — Z79899 Other long term (current) drug therapy: Secondary | ICD-10-CM | POA: Insufficient documentation

## 2019-06-19 DIAGNOSIS — C8331 Diffuse large B-cell lymphoma, lymph nodes of head, face, and neck: Secondary | ICD-10-CM | POA: Diagnosis not present

## 2019-06-19 DIAGNOSIS — D472 Monoclonal gammopathy: Secondary | ICD-10-CM | POA: Diagnosis not present

## 2019-06-19 DIAGNOSIS — Z8249 Family history of ischemic heart disease and other diseases of the circulatory system: Secondary | ICD-10-CM | POA: Diagnosis not present

## 2019-06-19 DIAGNOSIS — E1165 Type 2 diabetes mellitus with hyperglycemia: Secondary | ICD-10-CM | POA: Diagnosis not present

## 2019-06-19 DIAGNOSIS — E1136 Type 2 diabetes mellitus with diabetic cataract: Secondary | ICD-10-CM | POA: Insufficient documentation

## 2019-06-19 DIAGNOSIS — C8581 Other specified types of non-Hodgkin lymphoma, lymph nodes of head, face, and neck: Secondary | ICD-10-CM | POA: Diagnosis not present

## 2019-06-19 DIAGNOSIS — Z8509 Personal history of malignant neoplasm of other digestive organs: Secondary | ICD-10-CM | POA: Diagnosis not present

## 2019-06-19 DIAGNOSIS — L97519 Non-pressure chronic ulcer of other part of right foot with unspecified severity: Secondary | ICD-10-CM | POA: Diagnosis not present

## 2019-06-19 DIAGNOSIS — R269 Unspecified abnormalities of gait and mobility: Secondary | ICD-10-CM | POA: Diagnosis not present

## 2019-06-19 LAB — COMPREHENSIVE METABOLIC PANEL
ALT: 15 U/L (ref 0–44)
AST: 14 U/L — ABNORMAL LOW (ref 15–41)
Albumin: 3.8 g/dL (ref 3.5–5.0)
Alkaline Phosphatase: 80 U/L (ref 38–126)
Anion gap: 8 (ref 5–15)
BUN: 30 mg/dL — ABNORMAL HIGH (ref 8–23)
CO2: 25 mmol/L (ref 22–32)
Calcium: 10.8 mg/dL — ABNORMAL HIGH (ref 8.9–10.3)
Chloride: 107 mmol/L (ref 98–111)
Creatinine, Ser: 1.04 mg/dL (ref 0.61–1.24)
GFR calc Af Amer: >60 mL/min (ref >60)
GFR calc non Af Amer: >60 mL/min (ref >60)
Glucose, Bld: 157 mg/dL — ABNORMAL HIGH (ref 70–99)
Potassium: 5.2 mmol/L — ABNORMAL HIGH (ref 3.5–5.1)
Sodium: 140 mmol/L (ref 135–145)
Total Bilirubin: 0.5 mg/dL (ref 0.3–1.2)
Total Protein: 7.2 g/dL (ref 6.5–8.1)

## 2019-06-19 LAB — CBC WITH DIFFERENTIAL/PLATELET
Abs Immature Granulocytes: 0.36 10*3/uL — ABNORMAL HIGH (ref 0.00–0.07)
Basophils Absolute: 0 10*3/uL (ref 0.0–0.1)
Basophils Relative: 0 %
Eosinophils Absolute: 0.1 10*3/uL (ref 0.0–0.5)
Eosinophils Relative: 0 %
HCT: 36.4 % — ABNORMAL LOW (ref 39.0–52.0)
Hemoglobin: 12 g/dL — ABNORMAL LOW (ref 13.0–17.0)
Immature Granulocytes: 2 %
Lymphocytes Relative: 24 %
Lymphs Abs: 3.5 10*3/uL (ref 0.7–4.0)
MCH: 32.3 pg (ref 26.0–34.0)
MCHC: 33 g/dL (ref 30.0–36.0)
MCV: 98.1 fL (ref 80.0–100.0)
Monocytes Absolute: 1.9 10*3/uL — ABNORMAL HIGH (ref 0.1–1.0)
Monocytes Relative: 13 %
Neutro Abs: 8.9 10*3/uL — ABNORMAL HIGH (ref 1.7–7.7)
Neutrophils Relative %: 61 %
Platelets: 180 10*3/uL (ref 150–400)
RBC: 3.71 MIL/uL — ABNORMAL LOW (ref 4.22–5.81)
RDW: 12.5 % (ref 11.5–15.5)
WBC: 14.8 10*3/uL — ABNORMAL HIGH (ref 4.0–10.5)
nRBC: 0 % (ref 0.0–0.2)

## 2019-06-19 LAB — LACTATE DEHYDROGENASE: LDH: 87 U/L — ABNORMAL LOW (ref 98–192)

## 2019-06-19 LAB — PSA: Prostatic Specific Antigen: <0.01 ng/mL (ref 0.00–4.00)

## 2019-06-19 MED ORDER — DENOSUMAB 120 MG/1.7ML ~~LOC~~ SOLN
120.0000 mg | Freq: Once | SUBCUTANEOUS | Status: AC
  Administered 2019-06-19: 120 mg via SUBCUTANEOUS
  Filled 2019-06-19: qty 1.7

## 2019-06-19 NOTE — Progress Notes (Signed)
Patient tolerated injection with no complaints voiced.  Site clean and dry with no bruising or swelling noted at site.  Band aid applied.  VSS with discharge and left in a wheel chair with no s/s of distress noted. 

## 2019-06-22 ENCOUNTER — Other Ambulatory Visit: Payer: Self-pay | Admitting: Family Medicine

## 2019-06-22 ENCOUNTER — Telehealth: Payer: Self-pay | Admitting: Family Medicine

## 2019-06-22 ENCOUNTER — Telehealth: Payer: Self-pay

## 2019-06-22 DIAGNOSIS — R269 Unspecified abnormalities of gait and mobility: Secondary | ICD-10-CM | POA: Diagnosis not present

## 2019-06-22 DIAGNOSIS — L97519 Non-pressure chronic ulcer of other part of right foot with unspecified severity: Secondary | ICD-10-CM | POA: Diagnosis not present

## 2019-06-22 DIAGNOSIS — I10 Essential (primary) hypertension: Secondary | ICD-10-CM | POA: Diagnosis not present

## 2019-06-22 DIAGNOSIS — R1310 Dysphagia, unspecified: Secondary | ICD-10-CM

## 2019-06-22 DIAGNOSIS — C8581 Other specified types of non-Hodgkin lymphoma, lymph nodes of head, face, and neck: Secondary | ICD-10-CM | POA: Diagnosis not present

## 2019-06-22 DIAGNOSIS — E1165 Type 2 diabetes mellitus with hyperglycemia: Secondary | ICD-10-CM | POA: Diagnosis not present

## 2019-06-22 DIAGNOSIS — C61 Malignant neoplasm of prostate: Secondary | ICD-10-CM | POA: Diagnosis not present

## 2019-06-22 NOTE — Telephone Encounter (Signed)
Please advise if this is ok to hive verbal order

## 2019-06-22 NOTE — Telephone Encounter (Signed)
Kathlee Nations is a nurse with [facility] is calling and states the patient is having trouble swallowing and would like the speech therapist to evaluate. CB#  [phone]

## 2019-06-22 NOTE — Telephone Encounter (Signed)
[name] I don't know anything about this do you? [name].

## 2019-06-22 NOTE — Telephone Encounter (Signed)
Pt's daughter Tye [place] made aware that we are working on patient assistance. SAMPLES provided.

## 2019-06-22 NOTE — Telephone Encounter (Signed)
Pt states he pick up meds from the office. [place]   Please call if ready [phone]  Thanks renee

## 2019-06-26 NOTE — Telephone Encounter (Signed)
Pt assistance was resent. Supplied pt with 3 weeks worth of samples.

## 2019-06-28 ENCOUNTER — Other Ambulatory Visit: Payer: Self-pay | Admitting: Family Medicine

## 2019-06-28 ENCOUNTER — Telehealth: Payer: Self-pay | Admitting: Family Medicine

## 2019-06-28 DIAGNOSIS — C8581 Other specified types of non-Hodgkin lymphoma, lymph nodes of head, face, and neck: Secondary | ICD-10-CM | POA: Diagnosis not present

## 2019-06-28 DIAGNOSIS — E1165 Type 2 diabetes mellitus with hyperglycemia: Secondary | ICD-10-CM | POA: Diagnosis not present

## 2019-06-28 DIAGNOSIS — I10 Essential (primary) hypertension: Secondary | ICD-10-CM | POA: Diagnosis not present

## 2019-06-28 DIAGNOSIS — R1319 Other dysphagia: Secondary | ICD-10-CM

## 2019-06-28 DIAGNOSIS — R269 Unspecified abnormalities of gait and mobility: Secondary | ICD-10-CM | POA: Diagnosis not present

## 2019-06-28 DIAGNOSIS — L97519 Non-pressure chronic ulcer of other part of right foot with unspecified severity: Secondary | ICD-10-CM | POA: Diagnosis not present

## 2019-06-28 DIAGNOSIS — C61 Malignant neoplasm of prostate: Secondary | ICD-10-CM | POA: Diagnosis not present

## 2019-06-28 NOTE — Telephone Encounter (Signed)
Marye Round is calling from [facility] and is a speech therapist and is requesting verbal orders for 1x a week for 4 weeks and effective 07/17/2019. She is also recommending a modified barium swallow study with a speech therapist to determine what patient swallow function is. She states they can do that at [facility] outpatient. She also wanted to make a note that the patient is having tongue fasciculations.   CB# [phone]

## 2019-06-28 NOTE — Telephone Encounter (Signed)
I gave brittany the verbal order for the speech therapy but they are recommending patient to have a varium swallow study to be done and ordered. Please advise

## 2019-06-29 ENCOUNTER — Encounter: Payer: Self-pay | Admitting: Family Medicine

## 2019-06-29 ENCOUNTER — Other Ambulatory Visit: Payer: Self-pay | Admitting: Family Medicine

## 2019-06-29 DIAGNOSIS — M869 Osteomyelitis, unspecified: Secondary | ICD-10-CM

## 2019-06-29 NOTE — Progress Notes (Signed)
Wound care.

## 2019-06-30 ENCOUNTER — Encounter: Payer: Self-pay | Admitting: Family Medicine

## 2019-07-04 ENCOUNTER — Telehealth: Payer: Self-pay | Admitting: *Deleted

## 2019-07-04 DIAGNOSIS — E1142 Type 2 diabetes mellitus with diabetic polyneuropathy: Secondary | ICD-10-CM | POA: Diagnosis not present

## 2019-07-04 DIAGNOSIS — L97512 Non-pressure chronic ulcer of other part of right foot with fat layer exposed: Secondary | ICD-10-CM | POA: Diagnosis not present

## 2019-07-04 DIAGNOSIS — E1151 Type 2 diabetes mellitus with diabetic peripheral angiopathy without gangrene: Secondary | ICD-10-CM | POA: Diagnosis not present

## 2019-07-04 NOTE — Telephone Encounter (Signed)
Call and notified pt the patient assistance [place] has arrived in office

## 2019-07-05 ENCOUNTER — Ambulatory Visit: Payer: Medicare Other | Admitting: Cardiothoracic Surgery

## 2019-07-05 ENCOUNTER — Encounter: Payer: Self-pay | Admitting: Family Medicine

## 2019-07-05 DIAGNOSIS — L97519 Non-pressure chronic ulcer of other part of right foot with unspecified severity: Secondary | ICD-10-CM | POA: Diagnosis not present

## 2019-07-05 DIAGNOSIS — R269 Unspecified abnormalities of gait and mobility: Secondary | ICD-10-CM | POA: Diagnosis not present

## 2019-07-05 DIAGNOSIS — C61 Malignant neoplasm of prostate: Secondary | ICD-10-CM | POA: Diagnosis not present

## 2019-07-05 DIAGNOSIS — C8581 Other specified types of non-Hodgkin lymphoma, lymph nodes of head, face, and neck: Secondary | ICD-10-CM | POA: Diagnosis not present

## 2019-07-05 DIAGNOSIS — I10 Essential (primary) hypertension: Secondary | ICD-10-CM | POA: Diagnosis not present

## 2019-07-05 DIAGNOSIS — E1165 Type 2 diabetes mellitus with hyperglycemia: Secondary | ICD-10-CM | POA: Diagnosis not present

## 2019-07-10 ENCOUNTER — Telehealth (INDEPENDENT_AMBULATORY_CARE_PROVIDER_SITE_OTHER): Payer: Self-pay | Admitting: Internal Medicine

## 2019-07-10 NOTE — Telephone Encounter (Signed)
Daughter [name]) called wanted to make an appointment -stated -  As to another swallowing study (last one in March 2020-"impaired" in all phases): He does seem to have more difficulty now than then. Rehman snipped the esophageal sphincter then to alleviate problems emptying.  Strategies are: head elevation in bed; dietary adjustments; eating positions. Aspiration pneumonia has not occurred since last March (had occurred 4 times in year before that). Dad is clearly cognizant enough to modify his own food intake based on texture and taste. I'm hoping that we could postpone another swallowing study, at least until toe appears healthier.  Sadly though, the speech therapist says she will discharge Dad unless a new study is conducted. Daughter wanted to know Dr Olevia Perches thoughts on this studay - please advise - ph# [phone]

## 2019-07-10 NOTE — Telephone Encounter (Signed)
daut

## 2019-07-10 NOTE — Telephone Encounter (Signed)
To be reviewed with Dr.Rehman 07/11/2019.

## 2019-07-11 ENCOUNTER — Other Ambulatory Visit (HOSPITAL_COMMUNITY): Payer: Self-pay | Admitting: Podiatry

## 2019-07-11 ENCOUNTER — Ambulatory Visit (HOSPITAL_COMMUNITY)
Admission: RE | Admit: 2019-07-11 | Discharge: 2019-07-11 | Disposition: A | Discharge status: Home / Self Care | Payer: Medicare Other | Source: Ambulatory Visit | Attending: Podiatry | Admitting: Podiatry

## 2019-07-11 ENCOUNTER — Other Ambulatory Visit: Payer: Self-pay

## 2019-07-11 DIAGNOSIS — E1142 Type 2 diabetes mellitus with diabetic polyneuropathy: Secondary | ICD-10-CM | POA: Diagnosis not present

## 2019-07-11 DIAGNOSIS — L97512 Non-pressure chronic ulcer of other part of right foot with fat layer exposed: Secondary | ICD-10-CM

## 2019-07-11 DIAGNOSIS — R600 Localized edema: Secondary | ICD-10-CM

## 2019-07-11 DIAGNOSIS — E1151 Type 2 diabetes mellitus with diabetic peripheral angiopathy without gangrene: Secondary | ICD-10-CM | POA: Diagnosis not present

## 2019-07-11 DIAGNOSIS — L97529 Non-pressure chronic ulcer of other part of left foot with unspecified severity: Secondary | ICD-10-CM | POA: Diagnosis not present

## 2019-07-11 DIAGNOSIS — E11621 Type 2 diabetes mellitus with foot ulcer: Secondary | ICD-10-CM | POA: Diagnosis not present

## 2019-07-12 DIAGNOSIS — C8581 Other specified types of non-Hodgkin lymphoma, lymph nodes of head, face, and neck: Secondary | ICD-10-CM | POA: Diagnosis not present

## 2019-07-12 DIAGNOSIS — E1165 Type 2 diabetes mellitus with hyperglycemia: Secondary | ICD-10-CM | POA: Diagnosis not present

## 2019-07-12 DIAGNOSIS — L97519 Non-pressure chronic ulcer of other part of right foot with unspecified severity: Secondary | ICD-10-CM | POA: Diagnosis not present

## 2019-07-12 DIAGNOSIS — R269 Unspecified abnormalities of gait and mobility: Secondary | ICD-10-CM | POA: Diagnosis not present

## 2019-07-12 DIAGNOSIS — C61 Malignant neoplasm of prostate: Secondary | ICD-10-CM | POA: Diagnosis not present

## 2019-07-12 DIAGNOSIS — I10 Essential (primary) hypertension: Secondary | ICD-10-CM | POA: Diagnosis not present

## 2019-07-14 DIAGNOSIS — L97519 Non-pressure chronic ulcer of other part of right foot with unspecified severity: Secondary | ICD-10-CM | POA: Diagnosis not present

## 2019-07-14 DIAGNOSIS — E1165 Type 2 diabetes mellitus with hyperglycemia: Secondary | ICD-10-CM | POA: Diagnosis not present

## 2019-07-14 DIAGNOSIS — C8581 Other specified types of non-Hodgkin lymphoma, lymph nodes of head, face, and neck: Secondary | ICD-10-CM | POA: Diagnosis not present

## 2019-07-14 DIAGNOSIS — C61 Malignant neoplasm of prostate: Secondary | ICD-10-CM | POA: Diagnosis not present

## 2019-07-14 DIAGNOSIS — R269 Unspecified abnormalities of gait and mobility: Secondary | ICD-10-CM | POA: Diagnosis not present

## 2019-07-14 DIAGNOSIS — I10 Essential (primary) hypertension: Secondary | ICD-10-CM | POA: Diagnosis not present

## 2019-07-17 DIAGNOSIS — E43 Unspecified severe protein-calorie malnutrition: Secondary | ICD-10-CM | POA: Diagnosis not present

## 2019-07-17 DIAGNOSIS — C61 Malignant neoplasm of prostate: Secondary | ICD-10-CM | POA: Diagnosis not present

## 2019-07-17 DIAGNOSIS — R269 Unspecified abnormalities of gait and mobility: Secondary | ICD-10-CM | POA: Diagnosis not present

## 2019-07-17 DIAGNOSIS — L97519 Non-pressure chronic ulcer of other part of right foot with unspecified severity: Secondary | ICD-10-CM | POA: Diagnosis not present

## 2019-07-17 DIAGNOSIS — Z89422 Acquired absence of other left toe(s): Secondary | ICD-10-CM | POA: Diagnosis not present

## 2019-07-17 DIAGNOSIS — Z7984 Long term (current) use of oral hypoglycemic drugs: Secondary | ICD-10-CM | POA: Diagnosis not present

## 2019-07-17 DIAGNOSIS — Z7901 Long term (current) use of anticoagulants: Secondary | ICD-10-CM | POA: Diagnosis not present

## 2019-07-17 DIAGNOSIS — C8581 Other specified types of non-Hodgkin lymphoma, lymph nodes of head, face, and neck: Secondary | ICD-10-CM | POA: Diagnosis not present

## 2019-07-17 DIAGNOSIS — R1312 Dysphagia, oropharyngeal phase: Secondary | ICD-10-CM | POA: Diagnosis not present

## 2019-07-17 DIAGNOSIS — I4821 Permanent atrial fibrillation: Secondary | ICD-10-CM | POA: Diagnosis not present

## 2019-07-17 DIAGNOSIS — I10 Essential (primary) hypertension: Secondary | ICD-10-CM | POA: Diagnosis not present

## 2019-07-17 DIAGNOSIS — M6281 Muscle weakness (generalized): Secondary | ICD-10-CM | POA: Diagnosis not present

## 2019-07-17 DIAGNOSIS — E1165 Type 2 diabetes mellitus with hyperglycemia: Secondary | ICD-10-CM | POA: Diagnosis not present

## 2019-07-18 ENCOUNTER — Other Ambulatory Visit: Payer: Self-pay | Admitting: Cardiology

## 2019-07-18 ENCOUNTER — Other Ambulatory Visit: Payer: Self-pay | Admitting: Family Medicine

## 2019-07-18 ENCOUNTER — Inpatient Hospital Stay (HOSPITAL_BASED_OUTPATIENT_CLINIC_OR_DEPARTMENT_OTHER): Payer: Medicare Other | Admitting: Hematology

## 2019-07-18 ENCOUNTER — Inpatient Hospital Stay (HOSPITAL_COMMUNITY): Payer: Medicare Other

## 2019-07-18 ENCOUNTER — Ambulatory Visit (HOSPITAL_COMMUNITY): Payer: Medicare Other

## 2019-07-18 ENCOUNTER — Other Ambulatory Visit: Payer: Self-pay

## 2019-07-18 VITALS — BP 106/68 | HR 92 | Temp 96.0°F | Resp 18 | Wt 143.0 lb

## 2019-07-18 VITALS — BP 125/71 | HR 80 | Temp 97.0°F | Resp 18

## 2019-07-18 DIAGNOSIS — C49A0 Gastrointestinal stromal tumor, unspecified site: Secondary | ICD-10-CM

## 2019-07-18 DIAGNOSIS — E1142 Type 2 diabetes mellitus with diabetic polyneuropathy: Secondary | ICD-10-CM | POA: Diagnosis not present

## 2019-07-18 DIAGNOSIS — C61 Malignant neoplasm of prostate: Secondary | ICD-10-CM

## 2019-07-18 DIAGNOSIS — E1140 Type 2 diabetes mellitus with diabetic neuropathy, unspecified: Secondary | ICD-10-CM

## 2019-07-18 DIAGNOSIS — E875 Hyperkalemia: Secondary | ICD-10-CM | POA: Diagnosis not present

## 2019-07-18 DIAGNOSIS — D472 Monoclonal gammopathy: Secondary | ICD-10-CM | POA: Diagnosis not present

## 2019-07-18 DIAGNOSIS — L97512 Non-pressure chronic ulcer of other part of right foot with fat layer exposed: Secondary | ICD-10-CM | POA: Diagnosis not present

## 2019-07-18 DIAGNOSIS — R600 Localized edema: Secondary | ICD-10-CM | POA: Diagnosis not present

## 2019-07-18 DIAGNOSIS — E1151 Type 2 diabetes mellitus with diabetic peripheral angiopathy without gangrene: Secondary | ICD-10-CM | POA: Diagnosis not present

## 2019-07-18 DIAGNOSIS — C7951 Secondary malignant neoplasm of bone: Secondary | ICD-10-CM | POA: Diagnosis not present

## 2019-07-18 DIAGNOSIS — C8331 Diffuse large B-cell lymphoma, lymph nodes of head, face, and neck: Secondary | ICD-10-CM | POA: Diagnosis not present

## 2019-07-18 LAB — CBC WITH DIFFERENTIAL/PLATELET
Abs Immature Granulocytes: 0.34 10*3/uL — ABNORMAL HIGH (ref 0.00–0.07)
Basophils Absolute: 0 10*3/uL (ref 0.0–0.1)
Basophils Relative: 0 %
Eosinophils Absolute: 0.1 10*3/uL (ref 0.0–0.5)
Eosinophils Relative: 1 %
HCT: 36.1 % — ABNORMAL LOW (ref 39.0–52.0)
Hemoglobin: 11.9 g/dL — ABNORMAL LOW (ref 13.0–17.0)
Immature Granulocytes: 3 %
Lymphocytes Relative: 27 %
Lymphs Abs: 2.7 10*3/uL (ref 0.7–4.0)
MCH: 32.2 pg (ref 26.0–34.0)
MCHC: 33 g/dL (ref 30.0–36.0)
MCV: 97.6 fL (ref 80.0–100.0)
Monocytes Absolute: 1.5 10*3/uL — ABNORMAL HIGH (ref 0.1–1.0)
Monocytes Relative: 15 %
Neutro Abs: 5.6 10*3/uL (ref 1.7–7.7)
Neutrophils Relative %: 54 %
Platelets: 177 10*3/uL (ref 150–400)
RBC: 3.7 MIL/uL — ABNORMAL LOW (ref 4.22–5.81)
RDW: 13.1 % (ref 11.5–15.5)
WBC: 10.2 10*3/uL (ref 4.0–10.5)
nRBC: 0 % (ref 0.0–0.2)

## 2019-07-18 LAB — COMPREHENSIVE METABOLIC PANEL
ALT: 18 U/L (ref 0–44)
AST: 15 U/L (ref 15–41)
Albumin: 3.8 g/dL (ref 3.5–5.0)
Alkaline Phosphatase: 58 U/L (ref 38–126)
Anion gap: 7 (ref 5–15)
BUN: 41 mg/dL — ABNORMAL HIGH (ref 8–23)
CO2: 22 mmol/L (ref 22–32)
Calcium: 11.5 mg/dL — ABNORMAL HIGH (ref 8.9–10.3)
Chloride: 113 mmol/L — ABNORMAL HIGH (ref 98–111)
Creatinine, Ser: 1.17 mg/dL (ref 0.61–1.24)
GFR calc Af Amer: >60 mL/min (ref >60)
GFR calc non Af Amer: 53 mL/min — ABNORMAL LOW (ref >60)
Glucose, Bld: 202 mg/dL — ABNORMAL HIGH (ref 70–99)
Potassium: 5.7 mmol/L — ABNORMAL HIGH (ref 3.5–5.1)
Sodium: 142 mmol/L (ref 135–145)
Total Bilirubin: 0.8 mg/dL (ref 0.3–1.2)
Total Protein: 6.6 g/dL (ref 6.5–8.1)

## 2019-07-18 LAB — LACTATE DEHYDROGENASE: LDH: 83 U/L — ABNORMAL LOW (ref 98–192)

## 2019-07-18 LAB — PSA: Prostatic Specific Antigen: <0.01 ng/mL (ref 0.00–4.00)

## 2019-07-18 MED ORDER — LANCETS 28G MISC: 1.0000 | Freq: Every morning | 1 refills | Status: DC

## 2019-07-18 MED ORDER — DENOSUMAB 120 MG/1.7ML ~~LOC~~ SOLN
120.0000 mg | Freq: Once | SUBCUTANEOUS | Status: AC
  Administered 2019-07-18: 120 mg via SUBCUTANEOUS
  Filled 2019-07-18: qty 1.7

## 2019-07-18 MED ORDER — SODIUM POLYSTYRENE SULFONATE 15 GM/60ML PO SUSP
60.0000 g | Freq: Once | ORAL | Status: AC
  Administered 2019-07-18: 60 g via ORAL
  Filled 2019-07-18: qty 240

## 2019-07-18 MED ORDER — SODIUM CHLORIDE 0.9 % IV SOLN: INTRAVENOUS | Status: AC

## 2019-07-18 MED ORDER — EASYMAX TEST VI STRP: ORAL_STRIP | 12 refills | Status: DC

## 2019-07-18 NOTE — Patient Instructions (Addendum)
[place] [facility] Discharge Instructions  You were seen today by Dr. Delton Coombes. He went over your recent lab results. You will get fluids today because your calcium is high.  Hold calcium until you see Dr. Delton Coombes again.  Stop taking Zytiga, as well.  Dr. Delton Coombes will send in Kayexalate for you to take at home because your potassium is elevated. Continue taking the Prednisone that you are already taking.  He will see you back in 3 weeks for labs and follow up.   Thank you for choosing [place] at [facility] to provide your oncology and hematology care.  To afford each patient quality time with our provider, please arrive at least 15 minutes before your scheduled appointment time.   If you have a lab appointment with the [facility] please come in thru the  Main Entrance and check in at the main information desk  You need to re-schedule your appointment should you arrive 10 or more minutes late.  We strive to give you quality time with our providers, and arriving late affects you and other patients whose appointments are after yours.  Also, if you no show three or more times for appointments you may be dismissed from the clinic at the providers discretion.     Again, thank you for choosing [facility].  Our hope is that these requests will decrease the amount of time that you wait before being seen by our physicians.       _____________________________________________________________  Should you have questions after your visit to [facility], please contact our office at [phone] between the hours of 8:00 a.m. and 4:30 p.m.  Voicemails left after 4:00 p.m. will not be returned until the following business day.  For prescription refill requests, have your pharmacy contact our office and allow 72 hours.    Cancer Center Support Programs:   > Cancer Support Group  2nd Tuesday of the month 1pm-2pm,  [facility]

## 2019-07-18 NOTE — Progress Notes (Signed)
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  The Multi-Gene Panel offered by [facility] includes sequencing and/or deletion duplication testing of the following 80 genes: ALK, APC, ATM, AXIN2,BAP1,  BARD1, BLM, BMPR1A, BRCA1, BRCA2, BRIP1, CASR, CDC73, CDH1, CDK4, CDKN1B, CDKN1C, CDKN2A (p14ARF), CDKN2A (p16INK4a), CEBPA, CHEK2, CTNNA1, DICER1, DIS3L2, EGFR (c.2369C>T, p.Thr790Met variant only), EPCAM (Deletion/duplication testing only), FH, FLCN, GATA2, GPC3, GREM1 (Promoter region deletion/duplication testing only), HOXB13 (c.251G>A, p.Gly84Glu), HRAS, KIT, MAX, MEN1, MET, MITF (c.952G>A, p.Glu318Lys variant only), MLH1, MSH2, MSH3, MSH6, MUTYH, NBN, NF1, NF2, NTHL1, PALB2, PDGFRA, PHOX2B, PMS2, POLD1, POLE, POT1, PRKAR1A, PTCH1, PTEN, RAD50, RAD51C, RAD51D, RB1, RECQL4, RET, RUNX1, SDHAF2, SDHA (sequence changes only), SDHB, SDHC, SDHD, SMAD4, SMARCA4, SMARCB1, SMARCE1, STK11, SUFU, TERT, TERT, TMEM127, TP53, TSC1, TSC2, VHL, WRN and WT1.  The report date is February 08, 2017.    Prostate Foster ([place])  09/08/2013 Initial Diagnosis   Prostate Foster ([place])   02/08/2017 Genetic Testing   ATM Gain (Exons 62-63) VUS identified on the multi-gene panel.  The Multi-Gene Panel offered by [facility] includes sequencing and/or deletion duplication testing of the following 80 genes: ALK, APC, ATM, AXIN2,BAP1,  BARD1, BLM, BMPR1A, BRCA1, BRCA2, BRIP1, CASR, CDC73,  CDH1, CDK4, CDKN1B, CDKN1C, CDKN2A (p14ARF), CDKN2A (p16INK4a), CEBPA, CHEK2, CTNNA1, DICER1, DIS3L2, EGFR (c.2369C>T, p.Thr790Met variant only), EPCAM (Deletion/duplication testing only), FH, FLCN, GATA2, GPC3, GREM1 (Promoter region deletion/duplication testing only), HOXB13 (c.251G>A, p.Gly84Glu), HRAS, KIT, MAX, MEN1, MET, MITF (c.952G>A, p.Glu318Lys variant only), MLH1, MSH2, MSH3, MSH6, MUTYH, NBN, NF1, NF2, NTHL1, PALB2, PDGFRA, PHOX2B, PMS2, POLD1, POLE, POT1, PRKAR1A, PTCH1, PTEN, RAD50, RAD51C, RAD51D, RB1, RECQL4, RET, RUNX1, SDHAF2, SDHA (sequence changes only), SDHB, SDHC, SDHD, SMAD4, SMARCA4, SMARCB1, SMARCE1, STK11, SUFU, TERT, TERT, TMEM127, TP53, TSC1, TSC2, VHL, WRN and WT1.  The report date is February 08, 2017.       Foster STAGING: Foster Staging NHL (non-Hodgkin's lymphoma) (HCC) Staging form: Lymphoid Neoplasms, AJCC 6th Edition - Clinical: Stage III - Signed by [name] Cancer, PA on 08/18/2011    INTERVAL HISTORY:  [name] 86 y.o. male seen for follow-up of metastatic prostate Foster.  He is taking abiraterone and prednisone.  He finished doxycycline for 2 infection.  He complains of leg pains and lower abdominal pain for the last 2 weeks.  Has also diarrhea.  Complains of feeling very tired.  He can only eat soft foods.  He missed taking his calcium in the last few days.  REVIEW OF SYSTEMS:  Review of Systems  Constitutional: Positive for fatigue.  Gastrointestinal: Positive for abdominal pain and diarrhea.  All other systems reviewed and are negative.    PAST MEDICAL/SURGICAL HISTORY:  Past Medical History:  Diagnosis Date  . Acute bronchitis   . Arthritis   . Atrial fibrillation ([place])    [name]- [facility] follows saw 11'14  . Bladder stones    tx. with oral meds and antibiotics, now surgery planned  . Cataracts, both eyes    surgery planned May 2015  . Diabetes  mellitus without complication (HCC)    Type II  . Dyspnea    with activity  .  Dysrhythmia    Afib  . Family history of breast Foster   . Family history of colon Foster   . GERD (gastroesophageal reflux disease)   . 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 Margot Foster, thus far without recurrence.   . Hypertension   . NHL (non-Hodgkin's lymphoma) ([place]) 2005   Diffuse large B-cell lymphoma, clinically stage IIIA, CD20 positive, status post cervical lymph node biopsy 08/03/2003 on the left. PET scan was also positive in the spleen and small bowel region, but bone marrow aspiration and biopsy were negative. So he essentially had stage IIIAs. He received R-CHOP x6 cycles with CR established by PET scan criteria on 11/23/2003 with no evidence for relapse th  . PAD (peripheral artery disease) ([place]) 08/27/2017  . Skin Foster    Basal cell- face,head, neck,  arms, legs, Back   Past Surgical History:  Procedure Laterality Date  . AMPUTATION Left 06/29/2017   Procedure: AMPUTATION LEFT GREAT TOE;  Surgeon: [name] Dutch, MD;  Location: [place];  Service: Vascular;  Laterality: Left;  . AMPUTATION Left 08/27/2017   Procedure: AMPUTATION Left SECOND TOE;  Surgeon: [name] Dutch, MD;  Location: [facility];  Service: Vascular;  Laterality: Left;  . CATARACT EXTRACTION Bilateral 2015  . CHOLECYSTECTOMY    . COLON RESECTION     small bowel  . CYSTOSCOPY WITH LITHOLAPAXY N/A 08/10/2013   Procedure: CYSTOSCOPY WITH LITHOLAPAXY WITH [name];  Surgeon: [name] Gallo, MD;  [facility];  Service: Urology;  Laterality: N/A;  . ESOPHAGEAL DILATION N/A 02/27/2016   Procedure: ESOPHAGEAL DILATION;  Surgeon: [name] Houston, MD;  [facility];  Service: Endoscopy;  Laterality: N/A;  . ESOPHAGEAL DILATION N/A 07/07/2018   Procedure: ESOPHAGEAL  DILATION;  Surgeon: [name] Houston, MD;  [facility];  Service: Endoscopy;  Laterality: N/A;  . ESOPHAGOGASTRODUODENOSCOPY N/A 02/27/2016   Procedure: ESOPHAGOGASTRODUODENOSCOPY (EGD);  Surgeon: [name] Houston, MD;  [facility];  Service: Endoscopy;  Laterality: N/A;  12:00  . ESOPHAGOGASTRODUODENOSCOPY N/A 07/07/2018   Procedure: ESOPHAGOGASTRODUODENOSCOPY (EGD);  Surgeon: [name] Houston, MD;  [facility];  Service: Endoscopy;  Laterality: N/A;  . INCISION AND DRAINAGE PERIRECTAL ABSCESS    . LOWER EXTREMITY ANGIOGRAPHY N/A 06/25/2017   Procedure: LOWER EXTREMITY ANGIOGRAPHY;  Surgeon: [name] Dutch, MD;  Location: [facility];  Service: Cardiovascular;  Laterality: N/A;  . LYMPH NODE DISSECTION Left    '05-neck  . PORT-A-CATH REMOVAL    . PORTACATH PLACEMENT     insertion and removal -last chemotherapy 10 yrs ago  . TRANSURETHRAL RESECTION OF PROSTATE N/A 08/10/2013   Procedure: TRANSURETHRAL RESECTION OF THE PROSTATE WITH GYRUS INSTRUMENTS;  Surgeon: [name] Gallo, MD;  [facility];  Service: Urology;  Laterality: N/A;     SOCIAL HISTORY:  Social History   Socioeconomic History  . Marital status: Married    Spouse name: Not on file  . Number of children: Not on file  . Years of education: Not on file  . Highest education level: Not on file  Occupational History  . Occupation: retired    Fish farm manager: RETIRED  Comment: salesman  Tobacco Use  . Smoking status: Never Smoker  . Smokeless tobacco: Never Used  Substance and Sexual Activity  . Alcohol use: No  . Drug use: No  . Sexual activity: Not Currently  Other Topics Concern  . Not on file  Social History Narrative  . Not on file   Social Determinants of Health   Financial Resource Strain:   . Difficulty of Paying Living Expenses:   Food Insecurity:   . Worried About Charity fundraiser in the Last Year:   . Arboriculturist in the Last Year:   Transportation  Needs:   . Film/video editor (Medical):   Marland Kitchen Lack of Transportation (Non-Medical):   Physical Activity:   . Days of Exercise per Week:   . Minutes of Exercise per Session:   Stress:   . Feeling of Stress :   Social Connections:   . Frequency of Communication with Friends and Family:   . Frequency of Social Gatherings with Friends and Family:   . Attends Religious Services:   . Active Member of Clubs or Organizations:   . Attends Archivist Meetings:   Marland Kitchen Marital Status:   Intimate Partner Violence:   . Fear of Current or Ex-Partner:   . Emotionally Abused:   Marland Kitchen Physically Abused:   . Sexually Abused:     FAMILY HISTORY:  Family History  Problem Relation Age of Onset  . Heart attack Mother        died in her 41s  . Other Father 33       pneumonia - died  . Breast Foster Sister        dx in her 46s-60s  . Colon Foster Brother        dx in his 74s  . Foster Sister        TAH/BSO due to "male Foster"  . Breast Foster Daughter 74  . Breast Foster Daughter 47       DCIS - ATM VUS on [facility] 83 gene panel in 2018  . Breast Foster Daughter 43       DCIS - Negative on [facility] MyRisk 25 gene apnel in 2015  . Thyroid Foster Daughter 72       Medullary and Papillary  . Thyroid nodules Grandchild     CURRENT MEDICATIONS:  Outpatient Encounter Medications as of 07/18/2019  Medication Sig  . blood glucose meter kit and supplies Dispense based on patient and insurance preference. Use four times daily as directed. (FOR ICD-10 E10.9, E11.9).  . Calcium Polycarbophil (FIBER-CAPS PO) Take 1 capsule by mouth 2 (two) times daily.  . Cholecalciferol (VITAMIN D3) 1000 units CAPS Take 1,000 Units by mouth every morning.   . Cranberry-Vitamin C-Vitamin E (CRANBERRY PLUS VITAMIN C) 4200-20-3 MG-MG-UNIT CAPS Take 1 capsule by mouth every morning.  . diltiazem (CARDIZEM) 60 MG tablet Take 1 tablet by mouth twice daily  . diphenhydrAMINE-APAP, sleep, (TYLENOL PM EXTRA STRENGTH PO)  Take 1 tablet by mouth at bedtime.   . donepezil (ARICEPT) 10 MG tablet Take 1 tablet by mouth once daily  . feeding supplement, ENSURE ENLIVE, (ENSURE ENLIVE) LIQD Take 237 mLs by mouth daily. CHOCOLATE   . fluticasone (FLONASE) 50 MCG/ACT nasal spray Place 2 sprays into both nostrils daily.  . furosemide (LASIX) 20 MG tablet Take 20 mg by mouth daily.   Marland Kitchen glipiZIDE (GLUCOTROL XL) 5 MG 24 hr tablet Take 5 mg by mouth daily with breakfast.   .  glucose blood (EASYMAX TEST) test strip Use as instructed  . Lancets 28G MISC 1 kit by Does not apply route every morning.  Marland Kitchen leuprolide (LUPRON) 11.25 MG KIT injection Inject into the muscle.  . lisinopril (ZESTRIL) 20 MG tablet Take 1 tablet by mouth once daily  . metFORMIN (GLUCOPHAGE) 850 MG tablet Take 850 mg by mouth 2 (two) times daily with a meal.   . mirtazapine (REMERON) 15 MG tablet Take 1 tablet (15 mg total) by mouth at bedtime.  . Multiple Vitamin (MULTIVITAMIN WITH MINERALS) TABS tablet Take 1 tablet by mouth every morning.  Marland Kitchen omeprazole (PRILOSEC) 40 MG capsule Take 40 mg by mouth every other day.   . Potassium Chloride (KLOR-CON 10 PO) Take 10 mg by mouth 2 (two) times daily.  . predniSONE (DELTASONE) 5 MG tablet Take 1/2 (one-half) tablet by mouth twice daily  . trimethoprim (TRIMPEX) 100 MG tablet Take 100 mg by mouth daily.   Marland Kitchen acetaminophen (TYLENOL) 500 MG tablet Take 500-1,000 mg by mouth every 8 (eight) hours as needed for mild pain, fever or headache.    No facility-administered encounter medications on file as of 07/18/2019.    ALLERGIES:  Allergies  Allergen Reactions  . Tape Other (See Comments)    SKIN IS VERY THIN AND WILL TEAR AND BRUISE VERY EASILY!!!!  . Augmentin [Amoxicillin-Pot Clavulanate] Rash and Other (See Comments)    Redness of skin     PHYSICAL EXAM:  ECOG Performance status: 1  Vitals:   07/18/19 1130  BP: 106/68  Pulse: 92  Resp: 18  Temp: (!) 96 F (35.6 C)  SpO2: 100%   Filed Weights    07/18/19 1130  Weight: 143 lb (64.9 kg)    Physical Exam Constitutional:      Appearance: Normal appearance. He is normal weight.  Cardiovascular:     Rate and Rhythm: Normal rate and regular rhythm.     Heart sounds: Normal heart sounds.  Pulmonary:     Effort: Pulmonary effort is normal.     Breath sounds: Normal breath sounds.  Abdominal:     General: There is no distension.     Palpations: Abdomen is soft. There is no mass.  Musculoskeletal:        General: No swelling.  Skin:    General: Skin is warm.  Neurological:     Mental Status: He is alert and oriented to person, place, and time. Mental status is at baseline.  Psychiatric:        Mood and Affect: Mood normal.        Behavior: Behavior normal.      LABORATORY DATA:  I have reviewed the labs as listed.  CBC    Component Value Date/Time   WBC 10.2 07/18/2019 1024   RBC 3.70 (L) 07/18/2019 1024   HGB 11.9 (L) 07/18/2019 1024   HCT 36.1 (L) 07/18/2019 1024   PLT 177 07/18/2019 1024   MCV 97.6 07/18/2019 1024   MCH 32.2 07/18/2019 1024   MCHC 33.0 07/18/2019 1024   RDW 13.1 07/18/2019 1024   LYMPHSABS 2.7 07/18/2019 1024   MONOABS 1.5 (H) 07/18/2019 1024   EOSABS 0.1 07/18/2019 1024   BASOSABS 0.0 07/18/2019 1024   CMP Latest Ref Rng & Units 07/18/2019 06/19/2019 05/22/2019  Glucose 70 - 99 mg/dL 202(H) 157(H) 194(H)  BUN 8 - 23 mg/dL 41(H) 30(H) 26(H)  Creatinine 0.61 - 1.24 mg/dL 1.17 1.04 0.88  Sodium 135 - 145 mmol/L 142 140 137  Potassium 3.5 - 5.1 mmol/L 5.7(H) 5.2(H) 4.9  Chloride 98 - 111 mmol/L 113(H) 107 103  CO2 22 - 32 mmol/L 22 25 21(L)  Calcium 8.9 - 10.3 mg/dL 11.5(H) 10.8(H) 9.8  Total Protein 6.5 - 8.1 g/dL 6.6 7.2 7.0  Total Bilirubin 0.3 - 1.2 mg/dL 0.8 0.5 1.0  Alkaline Phos 38 - 126 U/L 58 80 68  AST 15 - 41 U/L 15 14(L) 23  ALT 0 - 44 U/L _0 DIAGNOSTIC IMAGING:  I have reviewed the scans.     ASSESSMENT & PLAN:   Prostate Foster ([place]) 1.  Metastatic  prostate Foster to the bones: -Abiraterone and prednisone started in October 2016. -Bone scan on 08/27/2017 showed interval improvement in the T12 lesion.  No new lesions. -Abiraterone dose reduced to 500 mg daily and prednisone 2.5 mg daily on 02/10/2018. -We reviewed labs.  PSA on 06/19/2019 was less than 0.01. -He reported leg pains and lower abdominal pains for 2 weeks.  He has completed doxycycline for 2 infection yesterday. -I will hold his Abiraterone at this time.  He will continue prednisone.  I will see him back in 3 weeks for follow-up.  2.  Hyperkalemia: -Potassium today is 5.7.  Creatinine is 1.17. -I we will hold his oral potassium at home. -We will give him Kayexalate 60 g today.  3.  Hypocalcemia: -His calcium today is 11.5. -He will receive normal saline 1000 mL today.  He will hold his oral calcium at home. -He will also receive denosumab today.  4.  IgM MGUS: -SPEP on 01/25/2019 shows M spike stable at 0.4 g/dL and light chain ratio of 0.91.  5.  DLBCL: -Diagnosed in 2005, he does not have any B symptoms or lymphadenopathy at this time.     Orders placed this encounter:  Orders Placed This Encounter  Procedures  . CBC with Differential/Platelet  . Comprehensive metabolic panel  . Lactate dehydrogenase  . PSA      [name], MD [place] [phone]

## 2019-07-18 NOTE — Progress Notes (Signed)
1215 Labs reviewed with and pt seen by Dr. Delton Coombes and pt to received 1 liter of NS over 2 hrs and Xgeva injection as well as Kayexalate 60 grams to take home and take per MD                [name] tolerated IV hydration and Xgeva injection well without complaints or incident. Calcium 11.5 today and pt denied any tooth or jaw pain and no recent or future dental visits prior to administering the Xgeva. Peripheral IV site checked with positive blood return noted prior to and after infusion. Kayexalate 60 grams given to and reviewed with pt's daughter to have pt take once he gets home per MD order. Pt's daughter verbalized understanding. VSS upon discharge. Pt discharged via wheelchair in satisfactory condition accompanied by his daughter

## 2019-07-18 NOTE — Patient Instructions (Signed)
[place] at [facility] Discharge Instructions  Received IV hydration and Xgeva injection today. Follow-up as scheduled. Call clinic for any questions or concerns   Thank you for choosing [place] at [facility] to provide your oncology and hematology care.  To afford each patient quality time with our provider, please arrive at least 15 minutes before your scheduled appointment time.   If you have a lab appointment with the [facility] please come in thru the Main Entrance and check in at the main information desk.  You need to re-schedule your appointment should you arrive 10 or more minutes late.  We strive to give you quality time with our providers, and arriving late affects you and other patients whose appointments are after yours.  Also, if you no show three or more times for appointments you may be dismissed from the clinic at the providers discretion.     Again, thank you for choosing [facility].  Our hope is that these requests will decrease the amount of time that you wait before being seen by our physicians.       _____________________________________________________________  Should you have questions after your visit to [facility], please contact our office at [phone] between the hours of 8:00 a.m. and 4:30 p.m.  Voicemails left after 4:00 p.m. will not be returned until the following business day.  For prescription refill requests, have your pharmacy contact our office and allow 72 hours.    Due to Covid, you will need to wear a mask upon entering the hospital. If you do not have a mask, a mask will be given to you at the Main Entrance upon arrival. For doctor visits, patients may have 1 support person with them. For treatment visits, patients can not have anyone with them due to social distancing guidelines and our immunocompromised population.

## 2019-07-19 ENCOUNTER — Telehealth (HOSPITAL_COMMUNITY): Payer: Self-pay | Admitting: Surgery

## 2019-07-19 NOTE — Telephone Encounter (Signed)
Pt's daughter [name] had left a voicemail asking if the pt was supposed to stop taking potassium since his potassium level was high at his last visit.  Per, Dr. Delton Coombes, pt is to stop taking potassium.  I called the pt's daughter back and she verbalized understanding, and she was told to call back if they had any more questions or concerns.

## 2019-07-21 DIAGNOSIS — C61 Malignant neoplasm of prostate: Secondary | ICD-10-CM | POA: Diagnosis not present

## 2019-07-21 DIAGNOSIS — R269 Unspecified abnormalities of gait and mobility: Secondary | ICD-10-CM | POA: Diagnosis not present

## 2019-07-21 DIAGNOSIS — E1165 Type 2 diabetes mellitus with hyperglycemia: Secondary | ICD-10-CM | POA: Diagnosis not present

## 2019-07-21 DIAGNOSIS — C8581 Other specified types of non-Hodgkin lymphoma, lymph nodes of head, face, and neck: Secondary | ICD-10-CM | POA: Diagnosis not present

## 2019-07-21 DIAGNOSIS — L97519 Non-pressure chronic ulcer of other part of right foot with unspecified severity: Secondary | ICD-10-CM | POA: Diagnosis not present

## 2019-07-21 DIAGNOSIS — I10 Essential (primary) hypertension: Secondary | ICD-10-CM | POA: Diagnosis not present

## 2019-07-21 NOTE — Assessment & Plan Note (Signed)
1.  Metastatic prostate cancer to the bones: -Abiraterone and prednisone started in October 2016. -Bone scan on 08/27/2017 showed interval improvement in the T12 lesion.  No new lesions. -Abiraterone dose reduced to 500 mg daily and prednisone 2.5 mg daily on 02/10/2018. -We reviewed labs.  PSA on 06/19/2019 was less than 0.01. -He reported leg pains and lower abdominal pains for 2 weeks.  He has completed doxycycline for 2 infection yesterday. -I will hold his Abiraterone at this time.  He will continue prednisone.  I will see him back in 3 weeks for follow-up.  2.  Hyperkalemia: -Potassium today is 5.7.  Creatinine is 1.17. -I we will hold his oral potassium at home. -We will give him Kayexalate 60 g today.  3.  Hypocalcemia: -His calcium today is 11.5. -He will receive normal saline 1000 mL today.  He will hold his oral calcium at home. -He will also receive denosumab today.  4.  IgM MGUS: -SPEP on 01/25/2019 shows M spike stable at 0.4 g/dL and light chain ratio of 0.91.  5.  DLBCL: -Diagnosed in 2005, he does not have any B symptoms or lymphadenopathy at this time.

## 2019-07-25 ENCOUNTER — Other Ambulatory Visit: Payer: Self-pay

## 2019-07-25 ENCOUNTER — Encounter (HOSPITAL_BASED_OUTPATIENT_CLINIC_OR_DEPARTMENT_OTHER): Payer: Medicare Other | Attending: Internal Medicine | Admitting: Internal Medicine

## 2019-07-25 ENCOUNTER — Other Ambulatory Visit (HOSPITAL_COMMUNITY)
Admission: RE | Admit: 2019-07-25 | Discharge: 2019-07-25 | Disposition: A | Discharge status: Home / Self Care | Payer: Medicare Other | Source: Other Acute Inpatient Hospital | Attending: Internal Medicine | Admitting: Internal Medicine

## 2019-07-25 DIAGNOSIS — I4891 Unspecified atrial fibrillation: Secondary | ICD-10-CM | POA: Diagnosis not present

## 2019-07-25 DIAGNOSIS — Z880 Allergy status to penicillin: Secondary | ICD-10-CM | POA: Insufficient documentation

## 2019-07-25 DIAGNOSIS — Z7901 Long term (current) use of anticoagulants: Secondary | ICD-10-CM | POA: Diagnosis not present

## 2019-07-25 DIAGNOSIS — E1140 Type 2 diabetes mellitus with diabetic neuropathy, unspecified: Secondary | ICD-10-CM | POA: Diagnosis not present

## 2019-07-25 DIAGNOSIS — C7951 Secondary malignant neoplasm of bone: Secondary | ICD-10-CM | POA: Diagnosis not present

## 2019-07-25 DIAGNOSIS — Z881 Allergy status to other antibiotic agents status: Secondary | ICD-10-CM | POA: Diagnosis not present

## 2019-07-25 DIAGNOSIS — Z9079 Acquired absence of other genital organ(s): Secondary | ICD-10-CM | POA: Insufficient documentation

## 2019-07-25 DIAGNOSIS — Z8572 Personal history of non-Hodgkin lymphomas: Secondary | ICD-10-CM | POA: Insufficient documentation

## 2019-07-25 DIAGNOSIS — L97512 Non-pressure chronic ulcer of other part of right foot with fat layer exposed: Secondary | ICD-10-CM | POA: Diagnosis not present

## 2019-07-25 DIAGNOSIS — Z85828 Personal history of other malignant neoplasm of skin: Secondary | ICD-10-CM | POA: Insufficient documentation

## 2019-07-25 DIAGNOSIS — I10 Essential (primary) hypertension: Secondary | ICD-10-CM | POA: Diagnosis not present

## 2019-07-25 DIAGNOSIS — L97514 Non-pressure chronic ulcer of other part of right foot with necrosis of bone: Secondary | ICD-10-CM | POA: Insufficient documentation

## 2019-07-25 DIAGNOSIS — Z833 Family history of diabetes mellitus: Secondary | ICD-10-CM | POA: Insufficient documentation

## 2019-07-25 DIAGNOSIS — E1151 Type 2 diabetes mellitus with diabetic peripheral angiopathy without gangrene: Secondary | ICD-10-CM | POA: Insufficient documentation

## 2019-07-25 DIAGNOSIS — Z8546 Personal history of malignant neoplasm of prostate: Secondary | ICD-10-CM | POA: Diagnosis not present

## 2019-07-25 DIAGNOSIS — Z8249 Family history of ischemic heart disease and other diseases of the circulatory system: Secondary | ICD-10-CM | POA: Insufficient documentation

## 2019-07-25 DIAGNOSIS — E1142 Type 2 diabetes mellitus with diabetic polyneuropathy: Secondary | ICD-10-CM | POA: Insufficient documentation

## 2019-07-25 DIAGNOSIS — E11621 Type 2 diabetes mellitus with foot ulcer: Secondary | ICD-10-CM | POA: Diagnosis not present

## 2019-07-26 NOTE — Progress Notes (Signed)
EVERLY, HAIZLIP ([id number]) Visit Report for 07/25/2019 Abuse/Suicide Risk Screen Details Patient Name: Date of Service: NAREG, ACHUFF 07/25/2019 10:30 AM Medical Record [id number] Patient Account Number: [id number] Date of Birth/Sex: Treating RN: [birth year] (86 y.o. [name] Primary Care Bijou Easler: Benny Lennert Other Clinician: Referring Prisma Decarlo: Treating Kaspar Albornoz/Extender:Robson, Shayne Alken, LISA Weeks in Treatment: 0 Abuse/Suicide Risk Screen Items Answer ABUSE RISK SCREEN: Has anyone close to you tried to hurt or harm you recentlyo No Do you feel uncomfortable with anyone in your familyo No Has anyone forced you do things that you didnt want to doo No Electronic Signature(s) Signed: 07/25/2019 6:21:28 PM By: Baruch Gouty RN, BSN Entered By: Baruch Gouty on 07/25/2019 11:38:29 -------------------------------------------------------------------------------- Activities of Daily Living Details Patient Name: Date of Service: BANDON, CHERNE 07/25/2019 10:30 AM Medical Record [id number] Patient Account Number: [id number] Date of Birth/Sex: Treating RN: 07-27-1923 (86 y.o. [name] Primary Care Anikah Hogge: Benny Lennert Other Clinician: Referring Telina Kleckley: Treating Donalee Gaumond/Extender:Robson, Shayne Alken, LISA Weeks in Treatment: 0 Activities of Daily Living Items Answer Activities of Daily Living (Please select one for each item) Drive Automobile Not Able Take Medications Need Assistance Use Telephone Completely Able Care for Appearance Need Assistance Use Toilet Need Assistance Bath / Shower Need Assistance Dress Self Need Assistance Feed Self Completely Able Walk Need Assistance Get In / Out Bed Completely [place] Need Assistance Shop for Self Need Assistance Electronic Signature(s) Signed: 07/25/2019 6:21:28 PM By: Baruch Gouty RN, BSN Entered By: Baruch Gouty  on 07/25/2019 11:39:55 -------------------------------------------------------------------------------- Education Screening Details Patient Name: Date of Service: KORBY, PELUSO 07/25/2019 10:30 AM Medical Record [id number] Patient Account Number: [id number] Date of Birth/Sex: Treating RN: February 16, 1924 (86 y.o. [name] Primary Care Chellsie Gomer: Benny Lennert Other Clinician: Referring Jakie Debow: Treating Karah Caruthers/Extender:Robson, Shayne Alken, Mart Piggs in Treatment: 0 Primary Learner Assessed: Patient Learning Preferences/Education Level/Primary Language Learning Preference: Explanation, Demonstration, Printed Material Highest Education Level: High School Preferred Language: English Cognitive Barrier Language Barrier: No Translator Needed: No Memory Deficit: Yes Emotional Barrier: No Cultural/Religious Beliefs Affecting Medical Care: No Physical Barrier Impaired Vision: Yes Glasses Impaired Hearing: Yes very hard of hearing Knowledge/Comprehension Knowledge Level: High Comprehension Level: High Ability to understand written High instructions: Ability to understand verbal High instructions: Motivation Anxiety Level: Calm Cooperation: Cooperative Education Importance: Acknowledges Need Interest in Health Problems: Asks Questions Perception: Coherent Willingness to Engage in Self- High Management Activities: Readiness to Engage in Self- High Management Activities: Electronic Signature(s) Signed: 07/25/2019 6:21:28 PM By: Baruch Gouty RN, BSN Entered By: Baruch Gouty on 07/25/2019 11:40:49 -------------------------------------------------------------------------------- Fall Risk Assessment Details Patient Name: Date of Service: [name] 07/25/2019 10:30 AM Medical Record [id number] Patient Account Number: [id number] Date of Birth/Sex: Treating RN: 1923/09/30 (86 y.o. [name] Primary Care Ermias Tomeo: Benny Lennert Other  Clinician: Referring Jasline Buskirk: Treating Wong Steadham/Extender:Robson, Shayne Alken, LISA Weeks in Treatment: 0 Fall Risk Assessment Items Have you had 2 or more falls in the last 12 monthso 0 No Have you had any fall that resulted in injury in the last 12 monthso 0 No FALLS RISK SCREEN History of falling - immediate or within 3 months 0 No Secondary diagnosis (Do you have 2 or more medical diagnoseso) 0 No Ambulatory aid None/bed rest/wheelchair/nurse 0 No Crutches/cane/walker 15 Yes Furniture 0 No Intravenous therapy Access/Saline/Heparin Lock 0 No Weak (short steps with or without shuffle, stooped but able to lift head 10 Yes while walking, may seek support from furniture) Impaired (  short steps with shuffle, may have difficulty arising from chair, 0 No head down, impaired balance) Mental Status Oriented to own ability 0 Yes Overestimates or forgets limitations 0 No Risk Level: Medium Risk Score: 25 Electronic Signature(s) Signed: 07/25/2019 6:21:28 PM By: Baruch Gouty RN, BSN Entered By: Baruch Gouty on 07/25/2019 11:41:06 -------------------------------------------------------------------------------- Foot Assessment Details Patient Name: Date of Service: [name] 07/25/2019 10:30 AM Medical Record [id number] Patient Account Number: [id number] Date of Birth/Sex: Treating RN: 17-Dec-1923 (86 y.o. [name] Primary Care Krissy Orebaugh: Benny Lennert Other Clinician: Referring Hildy Nicholl: Treating Sondra Blixt/Extender:Robson, Shayne Alken, LISA Weeks in Treatment: 0 Foot Assessment Items Site Locations + = Sensation present, - = Sensation absent, C = Callus, U = Ulcer R = Redness, W = Warmth, M = Maceration, PU = Pre-ulcerative lesion F = Fissure, S = Swelling, D = Dryness Assessment Right: Left: Other Deformity: No No Prior Foot Ulcer: Yes Yes Prior Amputation: No Yes Charcot Joint: No No Ambulatory Status: Ambulatory With Help Assistance Device:  Walker Gait: Steady Electronic Signature(s) Signed: 07/25/2019 6:21:28 PM By: Baruch Gouty RN, BSN Entered By: Baruch Gouty on 07/25/2019 11:43:07 -------------------------------------------------------------------------------- Nutrition Risk Screening Details Patient Name: Date of Service: KINGSON, WELLES 07/25/2019 10:30 AM Medical Record [id number] Patient Account Number: [id number] Date of Birth/Sex: Treating RN: 1924/03/06 (86 y.o. [name] Primary Care Jaysiah Marchetta: Benny Lennert Other Clinician: Referring Mliss Wedin: Treating Luian Schumpert/Extender:Robson, Shayne Alken, LISA Weeks in Treatment: 0 Height (in): 70 Weight (lbs): 140 Body Mass Index (BMI): 20.1 Nutrition Risk Screening Items Score Screening NUTRITION RISK SCREEN: I have an illness or condition that made me change the kind and/or 0 No amount of food I eat I eat fewer than two meals per day 0 No I eat few fruits and vegetables, or milk products 0 No I have three or more drinks of beer, liquor or wine almost every day 0 No I have tooth or mouth problems that make it hard for me to eat 2 Yes I don't always have enough money to buy the food I need 0 No I eat alone most of the time 0 No I take three or more different prescribed or over-the-counter drugs a day 1 Yes 0 No Without wanting to, I have lost or gained 10 pounds in the last six months I am not always physically able to shop, cook and/or feed myself 2 Yes Nutrition Protocols Good Risk Protocol Provide education on elevated blood sugars and Moderate Risk Protocol 0 impact on wound healing, as applicable High Risk Proctocol Risk Level: Moderate Risk Score: 5 Electronic Signature(s) Signed: 07/25/2019 6:21:28 PM By: Baruch Gouty RN, BSN Entered By: Baruch Gouty on 07/25/2019 11:42:27

## 2019-07-26 NOTE — Progress Notes (Signed)
ADYN, DOWER ([id number]) Visit Report for 07/25/2019 Chief Complaint Document Details Patient Name: Date of Service: [name], Foster 07/25/2019 10:30 AM Medical Record [id number] Patient Account Number: [id number] Date of Birth/Sex: Treating RN: October 22, 1923 (86 y.o. [name]) Carlene Coria Primary Care Provider: Benny Lennert Other Clinician: Referring Provider: Treating Provider/Extender:Larri Brewton, Shayne Alken, LISA Weeks in Treatment: 0 Information Obtained from: Patient Chief Complaint 05/02/2019; patient is here for review of the wound on the tip of the right great toe right at the tip of his toenail 07/25/2019; patient is back for review of the wound on the tip of his right great toe Electronic Signature(s) Signed: 07/25/2019 6:13:52 PM By: Linton Ham MD Entered By: Linton Ham on 07/25/2019 12:50:28 -------------------------------------------------------------------------------- Debridement Details Patient Name: Date of Service: [name] 07/25/2019 10:30 AM Medical Record [id number] Patient Account Number: [id number] Date of Birth/Sex: Treating RN: 08-Nov-1923 (86 y.o. [name]) Carlene Coria Primary Care Provider: Benny Lennert Other Clinician: Referring Provider: Treating Provider/Extender:Marshell Dilauro, Shayne Alken, LISA Weeks in Treatment: 0 Debridement Performed for Wound #2 Right Toe Great Assessment: Performed By: Physician Ricard Dillon., MD Debridement Type: Debridement Severity of Tissue Pre Fat layer exposed Debridement: Level of Consciousness (Pre- Awake and Alert procedure): Pre-procedure Verification/Time Out Taken: Yes - 12:21 Start Time: 12:21 Pain Control: Lidocaine 5% topical ointment Total Area Debrided (L x W): 0.3 (cm) x 0.4 (cm) = 0.12 (cm) Tissue and other material Viable, Non-Viable, Bone, Subcutaneous, Skin: Epidermis Viable, Non-Viable, Bone, Subcutaneous, Skin: Epidermis debrided: Level: Skin/Subcutaneous  Tissue/Muscle/Bone Debridement Description: Excisional Instrument: Nippers Specimen: Tissue Culture Number of Specimens Taken: 1 Bleeding: Moderate Hemostasis Achieved: Pressure End Time: 12:28 Procedural Pain: 0 Post Procedural Pain: 0 Response to Treatment: Procedure was tolerated well Level of Consciousness Awake and Alert (Post-procedure): Post Debridement Measurements of Total Wound Length: (cm) 0.3 Width: (cm) 0.4 Depth: (cm) 0.3 Volume: (cm) 0.028 Character of Wound/Ulcer Post Improved Debridement: Severity of Tissue Post Debridement: Fat layer exposed Post Procedure Diagnosis Same as Pre-procedure Electronic Signature(s) Signed: 07/25/2019 6:13:52 PM By: Linton Ham MD Signed: 07/25/2019 6:43:44 PM By: Carlene Coria RN Entered By: Linton Ham on 07/25/2019 12:48:00 -------------------------------------------------------------------------------- HPI Details Patient Name: Date of Service: [name] 07/25/2019 10:30 AM Medical Record [id number] Patient Account Number: [id number] Date of Birth/Sex: Treating RN: 22-Sep-1923 (86 y.o. [name] Primary Care Provider: Benny Lennert Other Clinician: Referring Provider: Treating Provider/Extender:Mando Blatz, Shayne Alken, LISA Weeks in Treatment: 0 History of Present Illness HPI Description: ADMISSION 05/02/2019 This is a 86 year old man accompanied by his daughter. He lives in [place] with his elderly wife. He has home caregivers as well. Apparently on February 28, 2019 they noted blood on his sock and noticed a wound on the tip of his right great toe. He has been cared for by Dr. Posey Pronto who is his podiatrist in [place]. Apparently they have been using Betadine to this area. The patient has a thick mycotic nail on the right as well which is embedded in the wound in some areas. Fortunately the patient has diabetic neuropathy and is not really experiencing any pain otherwise I think this  would all be quite painful. The patient was previously treated in 2019 for wounds on the left foot. This included a fairly thorough vascular review by vein and vascular Dr. Oneida Alar. His ABIs at that time were noncompressible bilaterally TBI on the right was 0.88 with biphasic waveforms on the right and monophasic waveforms at the left. He ended up having amputations of the left first and second  toes. He did have an angiogram on 06/25/2017 which showed on the right the right common femoral profundofemoral for Morris and superficial femoral arteries were widely patent the right popliteal was widely patent. Tibial vessels were not well opacified secondary to motion artifact however he was felt to have all 3 tibial vessels intact with some calcification and some areas of stenosis within the posterior tibial artery but relatively good flow to the right lower leg. He also had the left reviewed as well which was the source of the problem at that time. Past medical history; prostate cancer on oral antiandrogen therapy with bone mets, GI stromal tumor in 2005, type 2 diabetes with peripheral neuropathy, hypertension, atrial fibrillation on Eliquis, history of non-Hodgkin's lymphoma treated with CHOP in 2005. He had amputations of the left first and second toes secondary to osteomyelitis. Does not appear that he has had an x-ray of the right foot ABI in our clinic on the right was noncompressible [see full arterial discussion above]. 1/19; area on the tip of the right great toe under a very mycotic nail tip. We use silver alginate. 1/26; area of the tip of the right great toe under a very mycotic nail. A lot of this seems to have closed down we have been using silver alginate 2/9; the areas on the tip of the right great toe under a very mycotic nail. A lot of this continues to close down. I do not think there is a subungual problem. I am hopeful that this mycotic nail which is thick and raised will allow  full epithelialization of the wound 2/23; this was a difficult area on the tip of his right great toe. Thick mycotic nail over the base of the wound. This appears to have closed down now. The nail itself is thick split with considerable subungual debris. READMISSION 07/25/2019 This is a 86 year old man that we cared for earlier this year in the clinic without a wound on the tip of his right great toe under a very mycotic toenail. He is a type II diabetic. His ABIs have been noncompressible however he has been felt to have adequate blood flow in the right to heal the wound. We were able to get this wound to close over as I understand things he followed with Dr. Caprice Beaver had some debridement of the nail everything looks fine and apparently the wound bed was washed off by his wife over the weekend and by Monday it was open. His daughter is able to show me pictures. Unfortunately this now has exposed bone raising the possibility that he has had underlying osteomyelitis all along. He has been using Silvadene cream on this he has been referred back to the clinic by Dr. Caprice Beaver. He had x-rays of the right foot that did not show evidence of osteomyelitis in the right foot. He has not been on antibiotics He also had an x-ray of the left foot that showed findings suspicious for osteomyelitis involving the residual base of the first proximal phalanx and the proximal phalanx of the left second toe as well as the left second metatarsal head however he has no wounds in this area. Electronic Signature(s) Signed: 07/25/2019 6:13:52 PM By: Linton Ham MD Entered By: Linton Ham on 07/25/2019 12:55:00 -------------------------------------------------------------------------------- Physical Exam Details Patient Name: Date of Service: [name], Foster 07/25/2019 10:30 AM Medical Record [id number] Patient Account Number: [id number] Date of Birth/Sex: Treating RN: 01-20-1924 (86 y.o. [name]) Carlene Coria Primary Care Provider: Benny Lennert Other Clinician: Referring Provider:  Treating Provider/Extender:Tyrian Peart, Shayne Alken, LISA Weeks in Treatment: 0 Constitutional Sitting or standing Blood Pressure is within target range for patient.. Pulse regular and within target range for patient.Marland Kitchen Respirations regular, non-labored and within target range.. Temperature is normal and within the target range for the patient.Marland Kitchen Appears in no distress. Cardiovascular Femoral pulses palpable. His pedal pulses are certainly reduced but palpable at the dorsalis pedis and posterior tibia. Neurological He has markedly reduced sensation in his feet thankfully around allows for difficult debridement without nerve blocks. Notes Wound exam; indeed a very difficult wound at the tip of the right first toe once again at the base of a very mycotic nail as opposed to the last time I saw this entire wound bed is exposed bone. I was able to obtain a piece of the bone for culture. Hopefully this will allow for some room for granulation over the remaining exposed bone while we consider antibiotics. Electronic Signature(s) Signed: 07/25/2019 6:13:52 PM By: Linton Ham MD Entered By: Linton Ham on 07/25/2019 12:57:10 -------------------------------------------------------------------------------- Physician Orders Details Patient Name: Date of Service: [name], Foster 07/25/2019 10:30 AM Medical Record [id number] Patient Account Number: [id number] Date of Birth/Sex: Treating RN: Dec 03, 1923 (86 y.o. [name] Primary Care Provider: Benny Lennert Other Clinician: Referring Provider: Treating Provider/Extender:Tenishia Ekman, Shayne Alken, LISA Weeks in Treatment: 0 Verbal / Phone Orders: No Diagnosis Coding Follow-up Appointments Return Appointment in 1 week. Dressing Change Frequency Change Dressing every other day. Wound Cleansing May shower and wash wound with soap and water. - on days dressing  is not changed Primary Wound Dressing Silver Collagen - moisten with hydrogel or KY jelly Secondary Dressing Kerlix/Rolled [place] Wound #2 Right [facility] skilled nursing for wound care. - [facility] Bacteria identified in Unspecified specimen by Anaerobe culture (MICRO) - non healing wound right great toe LOINC Code: [id number] Convenience Name: Anerobic culture Electronic Signature(s) Signed: 07/25/2019 6:13:52 PM By: Linton Ham MD Signed: 07/25/2019 6:43:44 PM By: Carlene Coria RN Entered By: Carlene Coria on 07/25/2019 12:31:57 -------------------------------------------------------------------------------- Problem List Details Patient Name: Date of Service: [name], Foster 07/25/2019 10:30 AM Medical Record [id number] Patient Account Number: [id number] Date of Birth/Sex: Treating RN: February 22, 1924 (86 y.o. 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 [name] Primary Care Provider: Benny Lennert Other Clinician: Referring  Provider: Treating Provider/Extender:Finian Helvey, Shayne Alken, LISA Weeks in Treatment: 0 Subjective Chief Complaint Information obtained from Patient 05/02/2019; patient is here for review of the wound on the tip of the right great toe right at the tip of his toenail 07/25/2019; patient is back for review of the wound on the tip of his right great toe History of Present Illness (HPI) ADMISSION 05/02/2019 This is a 86 year old man accompanied by his daughter. He lives in [place] with his elderly wife. He  has home caregivers as well. Apparently on February 28, 2019 they noted blood on his sock and noticed a wound on the tip of his right great toe. He has been cared for by Dr. Posey Pronto who is his podiatrist in [place]. Apparently they have been using Betadine to this area. The patient has a thick mycotic nail on the right as well which is embedded in the wound in some areas. Fortunately the patient has diabetic neuropathy and is not really experiencing any pain otherwise I think this would all be quite painful. The patient was previously treated in 2019 for wounds on the left foot. This included a fairly thorough vascular review by vein and vascular Dr. Oneida Alar. His ABIs at that time were noncompressible bilaterally TBI on the right was 0.88 with biphasic waveforms on the right and monophasic waveforms at the left. He ended up having amputations of the left first and second toes. He did have an angiogram on 06/25/2017 which showed on the right the right common femoral profundofemoral for Morris and superficial femoral arteries were widely patent the right popliteal was widely patent. Tibial vessels were not well opacified secondary to motion artifact however he was felt to have all 3 tibial vessels intact with some calcification and some areas of stenosis within the posterior tibial artery but relatively good flow to the right lower leg. He also had the left reviewed as well which was  the source of the problem at that time. Past medical history; prostate cancer on oral antiandrogen therapy with bone mets, GI stromal tumor in 2005, type 2 diabetes with peripheral neuropathy, hypertension, atrial fibrillation on Eliquis, history of non-Hodgkin's lymphoma treated with CHOP in 2005. He had amputations of the left first and second toes secondary to osteomyelitis. Does not appear that he has had an x-ray of the right foot ABI in our clinic on the right was noncompressible [see full arterial discussion above]. 1/19; area on the tip of the right great toe under a very mycotic nail tip. We use silver alginate. 1/26; area of the tip of the right great toe under a very mycotic nail. A lot of this seems to have closed down we have been using silver alginate 2/9; the areas on the tip of the right great toe under a very mycotic nail. A lot of this continues to close down. I do not think there is a subungual problem. I am hopeful that this mycotic nail which is thick and raised will allow full epithelialization of the wound 2/23; this was a difficult area on the tip of his right great toe. Thick mycotic nail over the base of the wound. This appears to have closed down now. The nail itself is thick split with considerable subungual debris. READMISSION 07/25/2019 This is a 86 year old man that we cared for earlier this year in the clinic without a wound on the tip of his right great toe under a very mycotic toenail. He is a type II diabetic. His ABIs have been noncompressible however he has been felt to have adequate blood flow in the right to heal the wound. We were able to get this wound to close over as I understand things he followed with Dr. Caprice Beaver had some debridement of the nail everything looks fine and apparently the wound bed was washed off by his wife over the weekend and by Monday it was open. His daughter is able to show me pictures. Unfortunately this now has exposed bone  raising the possibility that he  has had underlying osteomyelitis all along. He has been using Silvadene cream on this he has been referred back to the clinic by Dr. Caprice Beaver. He had x-rays of the right foot that did not show evidence of osteomyelitis in the right foot. He has not been on antibiotics He also had an x-ray of the left foot that showed findings suspicious for osteomyelitis involving the residual base of the first proximal phalanx and the proximal phalanx of the left second toe as well as the left second metatarsal head however he has no wounds in this area. Patient History Information obtained from Patient. Allergies Augmentin (Severity: Severe, Reaction: rash) Family History Cancer - Siblings,Child, Diabetes - Siblings, Heart Disease - Mother, Hypertension - Child, Thyroid Problems - Siblings, No family history of Hereditary Spherocytosis, Kidney Disease, Lung Disease, Seizures, Stroke, Tuberculosis. Social History Never smoker, Marital Status - Married, Alcohol Use - Never, Drug Use - No History, Caffeine Use - Daily - coffee. Medical History Eyes Patient has history of Cataracts - bil removed Denies history of Glaucoma, Optic Neuritis Cardiovascular Patient has history of Arrhythmia - afib, Hypertension, Peripheral Arterial Disease Endocrine Patient has history of Type II Diabetes Denies history of Type I Diabetes Genitourinary Denies history of End Stage Renal Disease Integumentary (Skin) Denies history of History of Burn Musculoskeletal Patient has history of Osteomyelitis - left great toe Denies history of Gout, Rheumatoid Arthritis, Osteoarthritis Neurologic Patient has history of Dementia, Neuropathy Denies history of Quadriplegia, Paraplegia, Seizure Disorder Oncologic Patient has history of Received Chemotherapy Denies history of Received Radiation Psychiatric Denies history of Anorexia/bulimia, Confinement Anxiety Hospitalization/Surgery History - left  great and 2nd toe amputation. - bil cataract removed. - cholecystectomy. - colon resection. - cycstoscopy. - esophageal dilitation. - IandD perirectal abscess. - left neck lymph node resection. - TURP. Medical And Surgical History Notes Ear/Nose/Mouth/Throat hx vertigo, hard of hearing Hematologic/Lymphatic MGUS Respiratory hx PNA Gastrointestinal GIST removed 2005 Genitourinary prostate CA stage 4 Oncologic 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, hx non-hodgkins lymphoma, basal cell skin cancer removed Psychiatric fair apetite Review of Systems (ROS) Constitutional Symptoms (General Health) Denies complaints or symptoms of Fatigue, Fever, Chills, Marked Weight Change. Eyes Complains or has symptoms of Glasses / Contacts - reading. Ear/Nose/Mouth/Throat Denies complaints or symptoms of Chronic sinus problems or rhinitis. Respiratory Denies complaints or symptoms of Chronic or frequent coughs, Shortness of Breath. Cardiovascular Denies complaints or symptoms of Chest pain. Gastrointestinal Complains or has symptoms of Frequent diarrhea - not unusual. Genitourinary Denies complaints or symptoms of Frequent urination. Integumentary (Skin) Complains or has symptoms of Wounds - right great toe. Musculoskeletal Complains or has symptoms of Muscle Weakness. Neurologic Complains or has symptoms of Numbness/parasthesias. Psychiatric Denies complaints or symptoms of Claustrophobia, Suicidal. Objective Constitutional Sitting or standing Blood Pressure is within target range for patient.. Pulse regular and within target range for patient.Marland Kitchen Respirations regular, non-labored and within target range.. Temperature is normal and within the target range for the patient.Marland Kitchen Appears in no distress. Vitals Time Taken: 11:21 AM, Height: 70 in, Source: Stated, Weight: 140 lbs, Source: Stated, BMI: 20.1, Temperature: 97.7 F, Pulse: 75 bpm, Respiratory Rate: 18 breaths/min, Blood Pressure: 106/62  mmHg, Capillary Blood Glucose: 104 mg/dl. General Notes: glucose per pt report this am Cardiovascular Femoral pulses palpable. His pedal pulses are certainly reduced but palpable at the dorsalis pedis and posterior tibia. Neurological He has markedly reduced sensation in his feet thankfully around allows for difficult debridement without nerve blocks. General Notes: Wound exam; indeed  a very difficult wound at the tip of the right first toe once again at the base of a very mycotic nail as opposed to the last time I saw this entire wound bed is exposed bone. I was able to obtain a piece of the bone for culture. Hopefully this will allow for some room for granulation over the remaining exposed bone while we consider antibiotics. Integumentary (Hair, Skin) Wound #2 status is Open. Original cause of wound was Gradually Appeared. The wound is located on the Right Toe Great. The wound measures 0.3cm length x 0.4cm width x 0.3cm depth; 0.094cm^2 area and 0.028cm^3 volume. There is bone and Fat Layer (Subcutaneous Tissue) Exposed exposed. There is no tunneling or undermining noted. There is a small amount of serosanguineous drainage noted. The wound margin is distinct with the outline attached to the wound base. There is small (1-33%) pink granulation within the wound bed. There is a small (1-33%) amount of necrotic tissue within the wound bed including Adherent Slough. Assessment Active Problems ICD-10 Type 2 diabetes mellitus with foot ulcer Non-pressure chronic ulcer of other part of right foot with necrosis of bone Type 2 diabetes mellitus with diabetic neuropathy, unspecified Procedures Wound #2 Pre-procedure diagnosis of Wound #2 is a Diabetic Wound/Ulcer of the Lower Extremity located on the Right Toe Great .Severity of Tissue Pre Debridement is: Fat layer exposed. There was a Excisional Skin/Subcutaneous Tissue/Muscle/Bone Debridement with a total area of 0.12 sq cm performed by Ricard Dillon., MD. With the following instrument(s): Nippers to remove Viable and Non-Viable tissue/material. Material removed includes Bone,Subcutaneous Tissue, and Skin: Epidermis after achieving pain control using Lidocaine 5% topical ointment. 1 specimen was taken by a Tissue Culture and sent to the lab per facility protocol. A time out was conducted at 12:21, prior to the start of the procedure. A Moderate amount of bleeding was controlled with Pressure. The procedure was tolerated well with a pain level of 0 throughout and a pain level of 0 following the procedure. Post Debridement Measurements: 0.3cm length x 0.4cm width x 0.3cm depth; 0.028cm^3 volume. Character of Wound/Ulcer Post Debridement is improved. Severity of Tissue Post Debridement is: Fat layer exposed. Post procedure Diagnosis Wound #2: Same as Pre-Procedure Plan Follow-up Appointments: Return Appointment in 1 week. Dressing Change Frequency: Change Dressing every other day. Wound Cleansing: May shower and wash wound with soap and water. - on days dressing is not changed Primary Wound Dressing: Silver Collagen - moisten with hydrogel or KY jelly Secondary Dressing: Kerlix/Rolled Gauze Home Health: Wound #2 Right Toe Great: [place] skilled nursing for wound care. - [facility] Laboratory ordered were: Anerobic culture, bone, right great toe - non healing wound right great toe 1. Primary dressing silver collagen 2. Await the bone for culture that I sent. No empiric antibiotics. 3. One would have to surmise that the patient had underlying osteomyelitis the last time he was here probably as well. At this point I do not think it is necessary to go ahead and do further imaging like MRIs. 4. I am not sure how to interpret the changes on the x-ray that was done on the left foot. I suspect this was probably postoperative/reactive change rather than true osteomyelitis. In any case he has no wound on the left  foot. Electronic Signature(s) Signed: 07/25/2019 6:13:52 PM By: Linton Ham MD Entered By: Linton Ham on 07/25/2019 12:59:06 -------------------------------------------------------------------------------- HxROS Details Patient Name: Date of Service: [name], Foster 07/25/2019 10:30 AM Medical Record [id number] Patient  Account Number: [id number] Date of Birth/Sex: Treating RN: 28-May-1923 (86 y.o. [name] Primary Care Provider: Benny Lennert Other Clinician: Referring Provider: Treating Provider/Extender:Classie Weng, Shayne Alken, LISA Weeks in Treatment: 0 Information Obtained From Patient Constitutional Symptoms (General Health) Complaints and Symptoms: Negative for: Fatigue; Fever; Chills; Marked Weight Change Eyes Complaints and Symptoms: Positive for: Glasses / Contacts - reading Medical History: Positive for: Cataracts - bil removed Negative for: Glaucoma; Optic Neuritis Ear/Nose/Mouth/Throat Complaints and Symptoms: Negative for: Chronic sinus problems or rhinitis Medical History: Past Medical History Notes: hx vertigo, hard of hearing Respiratory Complaints and Symptoms: Negative for: Chronic or frequent coughs; Shortness of Breath Medical History: Past Medical History Notes: hx PNA Cardiovascular Complaints and Symptoms: Negative for: Chest pain Medical History: Positive for: Arrhythmia - afib; Hypertension; Peripheral Arterial Disease Gastrointestinal Complaints and Symptoms: Positive for: Frequent diarrhea - not unusual Medical History: Past Medical History Notes: GIST removed 2005 Genitourinary Complaints and Symptoms: Negative for: Frequent urination Medical History: Negative for: End Stage Renal Disease Past Medical History Notes: prostate CA stage 4 Integumentary (Skin) Complaints and Symptoms: Positive for: Wounds - right great toe Medical History: Negative for: History of Burn Musculoskeletal Complaints and  Symptoms: Positive for: Muscle Weakness Medical History: Positive for: Osteomyelitis - left great toe Negative for: Gout; Rheumatoid Arthritis; Osteoarthritis Neurologic Complaints and Symptoms: Positive for: Numbness/parasthesias Medical History: Positive for: Dementia; Neuropathy Negative for: Quadriplegia; Paraplegia; Seizure Disorder Psychiatric Complaints and Symptoms: Negative for: Claustrophobia; Suicidal Medical History: Negative for: Anorexia/bulimia; Confinement Anxiety Past Medical History Notes: fair apetite Hematologic/Lymphatic Medical History: Past Medical History Notes: MGUS Endocrine Medical History: Positive for: Type II Diabetes Negative for: Type I Diabetes Time with diabetes: decades Treated with: Oral agents Blood sugar tested every day: Yes Tested : daily Immunological Oncologic Medical History: Positive for: Received Chemotherapy Negative for: Received Radiation Past Medical History Notes: 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, hx non-hodgkins lymphoma, basal cell skin cancer removed HBO Extended History Items Eyes: Cataracts Immunizations Pneumococcal Vaccine: Received Pneumococcal Vaccination: Yes Implantable Devices None Hospitalization / Surgery History Type of Hospitalization/Surgery left great and 2nd toe amputation bil cataract removed cholecystectomy colon resection cycstoscopy esophageal dilitation IandD perirectal abscess left neck lymph node resection TURP Family and Social History Cancer: Yes - Siblings,Child; Diabetes: Yes - Siblings; Heart Disease: Yes - Mother; Hereditary Spherocytosis: No; Hypertension: Yes - Child; Kidney Disease: No; Lung Disease: No; Seizures: No; Stroke: No; Thyroid Problems: Yes - Siblings; Tuberculosis: No; Never smoker; Marital Status - Married; Alcohol Use: Never; Drug Use: No History; Caffeine Use: Daily - coffee; Financial Concerns: No; Food, Clothing or Shelter Needs: No; Support System  Lacking: No; Transportation Concerns: No Electronic Signature(s) Signed: 07/25/2019 6:13:52 PM By: Linton Ham MD Signed: 07/25/2019 6:21:28 PM By: Baruch Gouty RN, BSN Entered By: Baruch Gouty on 07/25/2019 11:38:20 -------------------------------------------------------------------------------- SuperBill Details Patient Name: Date of Service: [name] 07/25/2019 Medical Record [id number] Patient Account Number: [id number] Date of Birth/Sex: Treating RN: December 15, 1923 (86 y.o. [name] Primary Care Provider: Benny Lennert Other Clinician: Referring Provider: Treating Provider/Extender:Ananiah Maciolek, Shayne Alken, LISA Weeks in Treatment: 0 Diagnosis Coding ICD-10 Codes Code Description E11.621 Type 2 diabetes mellitus with foot ulcer L97.514 Non-pressure chronic ulcer of other part of right foot with necrosis of bone E11.40 Type 2 diabetes mellitus with diabetic neuropathy, unspecified Facility Procedures CPT4 Code Description: [id number] - WOUND CARE VISIT-LEV 3 EST PT Modifier: 25 Quantity: 1 CPT4 Code Description: [id number] - DEB BONE 20 SQ CM/< ICD-10 Diagnosis Description L97.514  Non-pressure chronic ulcer of other part of right foot with E11.621 Type 2 diabetes mellitus with foot ulcer Modifier: necrosis of bone Quantity: 1 Physician Procedures CPT4: Code Description Modifier Quantity [id number] - WC PHYS LEVEL 3 - EST PT 25 1 ICD-10 Diagnosis Description E11.621 Type 2 diabetes mellitus with foot ulcer L97.514 Non-pressure chronic ulcer of other part of right foot with necrosis of bone  E11.40 Type 2 diabetes mellitus with diabetic neuropathy, unspecified Electronic Signature(s) Signed: 07/25/2019 6:13:52 PM By: Linton Ham MD Signed: 07/25/2019 6:43:44 PM By: Carlene Coria RN Entered By: Carlene Coria on 07/25/2019 13:53:33

## 2019-07-27 DIAGNOSIS — I10 Essential (primary) hypertension: Secondary | ICD-10-CM | POA: Diagnosis not present

## 2019-07-27 DIAGNOSIS — L97519 Non-pressure chronic ulcer of other part of right foot with unspecified severity: Secondary | ICD-10-CM | POA: Diagnosis not present

## 2019-07-27 DIAGNOSIS — R269 Unspecified abnormalities of gait and mobility: Secondary | ICD-10-CM | POA: Diagnosis not present

## 2019-07-27 DIAGNOSIS — C8581 Other specified types of non-Hodgkin lymphoma, lymph nodes of head, face, and neck: Secondary | ICD-10-CM | POA: Diagnosis not present

## 2019-07-27 DIAGNOSIS — C61 Malignant neoplasm of prostate: Secondary | ICD-10-CM | POA: Diagnosis not present

## 2019-07-27 DIAGNOSIS — E1165 Type 2 diabetes mellitus with hyperglycemia: Secondary | ICD-10-CM | POA: Diagnosis not present

## 2019-07-27 NOTE — Telephone Encounter (Signed)
Please let the daughter know that Dr.Rehman feels that her Dad should have the study done.  Thank You

## 2019-07-28 DIAGNOSIS — R269 Unspecified abnormalities of gait and mobility: Secondary | ICD-10-CM | POA: Diagnosis not present

## 2019-07-28 DIAGNOSIS — E1165 Type 2 diabetes mellitus with hyperglycemia: Secondary | ICD-10-CM | POA: Diagnosis not present

## 2019-07-28 DIAGNOSIS — L97519 Non-pressure chronic ulcer of other part of right foot with unspecified severity: Secondary | ICD-10-CM | POA: Diagnosis not present

## 2019-07-28 DIAGNOSIS — C8581 Other specified types of non-Hodgkin lymphoma, lymph nodes of head, face, and neck: Secondary | ICD-10-CM | POA: Diagnosis not present

## 2019-07-28 DIAGNOSIS — C61 Malignant neoplasm of prostate: Secondary | ICD-10-CM | POA: Diagnosis not present

## 2019-07-28 DIAGNOSIS — I10 Essential (primary) hypertension: Secondary | ICD-10-CM | POA: Diagnosis not present

## 2019-07-30 LAB — AEROBIC/ANAEROBIC CULTURE W GRAM STAIN (SURGICAL/DEEP WOUND)

## 2019-07-31 ENCOUNTER — Encounter (HOSPITAL_BASED_OUTPATIENT_CLINIC_OR_DEPARTMENT_OTHER): Payer: Medicare Other | Admitting: Internal Medicine

## 2019-07-31 ENCOUNTER — Other Ambulatory Visit: Payer: Self-pay

## 2019-07-31 DIAGNOSIS — E11621 Type 2 diabetes mellitus with foot ulcer: Secondary | ICD-10-CM | POA: Diagnosis not present

## 2019-07-31 DIAGNOSIS — I4891 Unspecified atrial fibrillation: Secondary | ICD-10-CM | POA: Diagnosis not present

## 2019-07-31 DIAGNOSIS — I10 Essential (primary) hypertension: Secondary | ICD-10-CM | POA: Diagnosis not present

## 2019-07-31 DIAGNOSIS — E1140 Type 2 diabetes mellitus with diabetic neuropathy, unspecified: Secondary | ICD-10-CM | POA: Diagnosis not present

## 2019-07-31 DIAGNOSIS — L97514 Non-pressure chronic ulcer of other part of right foot with necrosis of bone: Secondary | ICD-10-CM | POA: Diagnosis not present

## 2019-07-31 DIAGNOSIS — E1142 Type 2 diabetes mellitus with diabetic polyneuropathy: Secondary | ICD-10-CM | POA: Diagnosis not present

## 2019-07-31 DIAGNOSIS — E1151 Type 2 diabetes mellitus with diabetic peripheral angiopathy without gangrene: Secondary | ICD-10-CM | POA: Diagnosis not present

## 2019-07-31 NOTE — Progress Notes (Signed)
SABRE, MORIOKA ([id number]) Visit Report for 07/25/2019 Allergy List Details Patient Name: Date of Service: [name], [name] 07/25/2019 10:30 AM Medical Record [id number] Patient Account Number: [id number] Date of Birth/Sex: Treating RN: [birth year] (86 y.o. [name] Primary Care Shaleena Crusoe: Benny Lennert Other Clinician: Referring Makenlee Mckeag: Treating Jaimie Redditt/Extender:Robson, Shayne Alken, LISA Weeks in Treatment: 0 Allergies Active Allergies Augmentin Reaction: rash Severity: Severe Allergy Notes Electronic Signature(s) Signed: 07/25/2019 6:21:28 PM By: Baruch Gouty RN, BSN Entered By: Baruch Gouty on 07/25/2019 11:35:09 -------------------------------------------------------------------------------- Arrival Information Details Patient Name: Date of Service: [name] 07/25/2019 10:30 AM Medical Record [id number] Patient Account Number: [id number] Date of Birth/Sex: Treating RN: 1923/05/25 (86 y.o. [name]) Carlene Coria Primary Care Janesha Brissette: Benny Lennert Other Clinician: Referring Lilia Letterman: Treating Razi Hickle/Extender:Robson, Shayne Alken, LISA Weeks in Treatment: 0 Visit Information Patient Arrived: Wheel Chair Arrival Time: 11:21 Accompanied By: daughter Transfer Assistance: None Patient Identification Verified: Yes Secondary Verification Process Yes Completed: History Since Last Visit Added or deleted any medications: No Any new allergies or adverse reactions: No Had a fall or experienced change in activities of daily living that may affect risk of falls: No Signs or symptoms of abuse/neglect since last visito No Hospitalized since last visit: No Implantable device outside of the clinic excluding cellular tissue based products placed in the center since last visit: No Has Dressing in Place as Prescribed: Yes Pain Present Now: No Electronic Signature(s) Signed: 07/25/2019 6:21:28 PM By: Baruch Gouty RN, BSN Entered By: Baruch Gouty on  07/25/2019 11:34:01 -------------------------------------------------------------------------------- Clinic Level of Care Assessment Details Patient Name: Date of Service: [name], [name] 07/25/2019 10:30 AM Medical Record [id number] Patient Account Number: [id number] Date of Birth/Sex: Treating RN: 1924/02/07 (86 y.o. [name]) Carlene Coria Primary Care Nathian Stencil: Benny Lennert Other Clinician: Referring Raychell Holcomb: Treating Jelissa Espiritu/Extender:Robson, Shayne Alken, LISA Weeks in Treatment: 0 Clinic Level of Care Assessment Items TOOL 1 Quantity Score X - Use when EandM and Procedure is performed on INITIAL visit 1 0 ASSESSMENTS - Nursing Assessment / Reassessment X - General Physical Exam (combine w/ comprehensive assessment (listed just below) 1 20 when performed on new pt. evals) X - Comprehensive Assessment (HX, ROS, Risk Assessments, Wounds Hx, etc.) 1 25 ASSESSMENTS - Wound and Skin Assessment / Reassessment []  - Dermatologic / Skin Assessment (not related to wound area) 0 ASSESSMENTS - Ostomy and/or Continence Assessment and Care []  - Incontinence Assessment and Management 0 []  - Ostomy Care Assessment and Management (repouching, etc.) 0 PROCESS - Coordination of Care X - Simple Patient / Family Education for ongoing care 1 15 []  - Complex (extensive) Patient / Family Education for ongoing care 0 X - Staff obtains Programmer, systems, Records, Test Results / Process Orders 1 10 []  - Staff telephones HHA, Nursing Homes / Clarify orders / etc 0 []  - Routine Transfer to another Facility (non-emergent condition) 0 []  - Routine Hospital Admission (non-emergent condition) 0 []  - New Admissions / Biomedical engineer / Ordering NPWT, Apligraf, etc. 0 []  - Emergency Hospital Admission (emergent condition) 0 PROCESS - Special Needs []  - Pediatric / Minor Patient Management 0 []  - Isolation Patient Management 0 []  - Hearing / Language / Visual special needs 0 []  - Assessment of Community  assistance (transportation, D/C planning, etc.) 0 []  - Additional assistance / Altered mentation 0 []  - Support Surface(s) Assessment (bed, cushion, seat, etc.) 0 INTERVENTIONS - Miscellaneous []  - External ear exam 0 []  - Patient Transfer (multiple staff / Civil Service fast streamer / Similar devices) 0 []  - Simple Staple /  Suture removal (25 or less) 0 []  - Complex Staple / Suture removal (26 or more) 0 []  - Hypo/Hyperglycemic Management (do not check if billed separately) 0 X - Ankle / Brachial Index (ABI) - do not check if billed separately 1 15 Has the patient been seen at the hospital within the last three years: Yes Total Score: 85 Level Of Care: New/Established - Level 3 Electronic Signature(s) Signed: 07/25/2019 6:43:44 PM By: Carlene Coria RN Entered By: Carlene Coria on 07/25/2019 13:53:16 -------------------------------------------------------------------------------- Encounter Discharge Information Details Patient Name: Date of Service: [name]. 07/25/2019 10:30 AM Medical Record [id number] Patient Account Number: [id number] Date of Birth/Sex: Treating RN: Nov 17, 1923 (86 y.o. [name] Primary Care Staci Dack: Benny Lennert Other Clinician: Referring Nicodemus Denk: Treating Donica Derouin/Extender:Robson, Shayne Alken, LISA Weeks in Treatment: 0 Encounter Discharge Information Items Post Procedure Vitals Discharge Condition: Stable Temperature (F): 97.7 Ambulatory Status: Wheelchair Pulse (bpm): 75 Discharge Destination: Home Respiratory Rate (breaths/min): 18 Transportation: Private Auto Blood Pressure (mmHg): 106/52 Accompanied By: daughter Schedule Follow-up Appointment: Yes Clinical Summary of Care: Patient Declined Electronic Signature(s) Signed: 07/31/2019 6:12:10 PM By: Levan Hurst RN, BSN Entered By: Levan Hurst on 07/25/2019 12:43:33 -------------------------------------------------------------------------------- Lower Extremity Assessment Details Patient  Name: Date of Service: [name], [name] 07/25/2019 10:30 AM Medical Record [id number] Patient Account Number: [id number] Date of Birth/Sex: Treating RN: August 06, 1923 (86 y.o. [name] Primary Care Zacharius Funari: Benny Lennert Other Clinician: Referring Nesiah Jump: Treating Tiara Bartoli/Extender:Robson, Shayne Alken, LISA Weeks in Treatment: 0 Edema Assessment Assessed: [Left: No] [Right: No] Edema: [Left: No] [Right: No] Calf Left: Right: Point of Measurement: cm From Medial Instep 29 cm 29.3 cm Ankle Left: Right: Point of Measurement: cm From Medial Instep 20.2 cm 20.6 cm Vascular Assessment Pulses: Dorsalis Pedis Palpable: [Left:No] [Right:No] Electronic Signature(s) Signed: 07/25/2019 6:21:28 PM By: Baruch Gouty RN, BSN Entered By: Baruch Gouty on 07/25/2019 11:45:40 -------------------------------------------------------------------------------- Multi Wound Chart Details Patient Name: Date of Service: [name] 07/25/2019 10:30 AM Medical Record [id number] Patient Account Number: [id number] Date of Birth/Sex: Treating RN: February 12, 1924 (86 y.o. [name]) Carlene Coria Primary Care Jarrell Armond: Benny Lennert Other Clinician: Referring Kaytlin Burklow: Treating Ronald Londo/Extender:Robson, Shayne Alken, LISA Weeks in Treatment: 0 Vital Signs Height(in): 70 Capillary Blood 104 Glucose(mg/dl): Weight(lbs): 140 Pulse(bpm): 75 Body Mass Index(BMI): 20 Blood Pressure(mmHg): 106/62 Temperature(F): 97.7 Respiratory 18 Rate(breaths/min): Photos: [2:No Photos] [N/A:N/A] Wound Location: [2:Right Toe Great] [N/A:N/A] Wounding Event: [2:Gradually Appeared] [N/A:N/A] Primary Etiology: [2:Diabetic Wound/Ulcer of the N/A Lower Extremity] Comorbid History: [2:Cataracts, Arrhythmia, Hypertension, Peripheral Arterial Disease, Type II Diabetes, Osteomyelitis, Dementia, Neuropathy, Received Chemotherapy] [N/A:N/A] Date Acquired: [2:06/15/2019] [N/A:N/A] Weeks of Treatment: [2:0]  [N/A:N/A] Wound Status: [2:Open] [N/A:N/A] Measurements L x W x D 0.3x0.4x0.3 [N/A:N/A] (cm) Area (cm) : [2:0.094] [N/A:N/A] Volume (cm) : [2:0.028] [N/A:N/A] Classification: [2:Grade 2] [N/A:N/A] Exudate Amount: [2:Small] [N/A:N/A] Exudate Type: [2:Serosanguineous] [N/A:N/A] Exudate Color: [2:red, brown] [N/A:N/A] Wound Margin: [2:Distinct, outline attached N/A] Granulation Amount: [2:Small (1-33%)] [N/A:N/A] Granulation Quality: [2:Pink] [N/A:N/A] Necrotic Amount: [2:Small (1-33%)] [N/A:N/A] Exposed Structures: [2:Fat Layer (Subcutaneous N/A Tissue) Exposed: Yes Bone: Yes Fascia: No Tendon: No Muscle: No Joint: No] Epithelialization: [2:None] [N/A:N/A] Debridement: [2:Debridement - Excisional N/A] Pre-procedure [2:12:21] [N/A:N/A] Verification/Time Out Taken: Pain Control: [2:Lidocaine 5% topical ointment] [N/A:N/A] Tissue Debrided: [2:Subcutaneous, Slough] [N/A:N/A] Level: [2:Skin/Subcutaneous Tissue N/A] Debridement Area (sq cm):0.12 [N/A:N/A] Instrument: [2:Nippers] [N/A:N/A] Bleeding: [2:Moderate] [N/A:N/A] Hemostasis Achieved: [2:Pressure] [N/A:N/A] Procedural Pain: [2:0] [N/A:N/A] Post Procedural Pain: [2:0] [N/A:N/A] Debridement Treatment [2:Procedure was tolerated] [N/A:N/A] Response: [2:well] Post Debridement [2:0.3x0.4x0.3] [N/A:N/A] Measurements L x W x D (cm) Post  Debridement [2:0.028] [N/A:N/A] Volume: (cm) Procedures Performed: [2:Debridement] [N/A:N/A] Treatment Notes Wound #2 (Right Toe Great) 1. Cleanse With Wound Cleanser 3. Primary Dressing Applied Collegen [employer] 4. Secondary Dressing Dry Gauze Roll Gauze 5. Secured With Recruitment consultant) Signed: 07/25/2019 6:13:52 PM By: Linton Ham MD Signed: 07/25/2019 6:43:44 PM By: Carlene Coria RN Entered By: Linton Ham on 07/25/2019 12:47:34 -------------------------------------------------------------------------------- Multi-Disciplinary Care Plan Details Patient Name: Date of  Service: [name], [name] 07/25/2019 10:30 AM Medical Record [id number] Patient Account Number: [id number] Date of Birth/Sex: Treating RN: 06-26-1923 (86 y.o. [name]) Carlene Coria Primary Care Makai Agostinelli: Benny Lennert Other Clinician: Referring Manvi Guilliams: Treating Donley Harland/Extender:Robson, Shayne Alken, LISA Weeks in Treatment: 0 Active Inactive Wound/Skin Impairment Nursing Diagnoses: Knowledge deficit related to ulceration/compromised skin integrity Goals: Patient/caregiver will verbalize understanding of skin care regimen Date Initiated: 07/25/2019 Target Resolution Date: 08/25/2019 Goal Status: Active Ulcer/skin breakdown will have a volume reduction of 30% by week 4 Date Initiated: 07/25/2019 Target Resolution Date: 08/25/2019 Goal Status: Active Interventions: Assess patient/caregiver ability to obtain necessary supplies Assess patient/caregiver ability to perform ulcer/skin care regimen upon admission and as needed Assess ulceration(s) every visit Notes: Electronic Signature(s) Signed: 07/25/2019 6:43:44 PM By: Carlene Coria RN Entered By: Carlene Coria on 07/25/2019 12:21:08 -------------------------------------------------------------------------------- Pain Assessment Details Patient Name: Date of Service: [name], [name] 07/25/2019 10:30 AM Medical Record [id number] Patient Account Number: [id number] Date of Birth/Sex: Treating RN: 06-Apr-1924 (86 y.o. [name] Primary Care Melisia Leming: Benny Lennert Other Clinician: Referring Leena Tiede: Treating Konstantin Lehnen/Extender:Robson, Shayne Alken, LISA Weeks in Treatment: 0 Active Problems Location of Pain Severity and Description of Pain Patient Has Paino Yes Site Locations Pain Location: Generalized Pain With Dressing Change: No Rate the pain. Current Pain Level: 0 Worst Pain Level: 4 Least Pain Level: 0 Character of Pain Describe the Pain: Aching Pain Management and Medication Current Pain Management: Activity:  Yes Other: reposition Is the Current Pain Management Adequate: Adequate How does your wound impact your activities of daily livingo Sleep: Yes Bathing: No Appetite: No Relationship With Others: No Bladder Continence: No Emotions: No Bowel Continence: No Work: No Toileting: No Drive: No Dressing: No Hobbies: No Notes reports pain at night occaissionaly to left foot and toes Electronic Signature(s) Signed: 07/25/2019 6:21:28 PM By: Baruch Gouty RN, BSN Entered By: Baruch Gouty on 07/25/2019 11:50:19 -------------------------------------------------------------------------------- Patient/Caregiver Education Details Patient Name: Date of Service: [name] 4/6/2021andnbsp10:30 AM Medical Record [id number] Patient Account Number: [id number] Date of Birth/Gender: [birth year] (86 y.o. M) Treating RN: Carlene Coria Primary Care Physician: Benny Lennert Other Clinician: Referring Physician: Treating Physician/Extender:Robson, Shayne Alken, Mart Piggs in Treatment: 0 Education Assessment Education Provided To: Patient Education Topics Provided Wound/Skin Impairment: Methods: Explain/Verbal Responses: State content correctly Electronic Signature(s) Signed: 07/25/2019 6:43:44 PM By: Carlene Coria RN Entered By: Carlene Coria on 07/25/2019 12:21:21 -------------------------------------------------------------------------------- Wound Assessment Details Patient Name: Date of Service: [name], [name] 07/25/2019 10:30 AM Medical Record [id number] Patient Account Number: [id number] Date of Birth/Sex: Treating RN: 18-Jan-1924 (86 y.o. [name] Primary Care Jasmynn Pfalzgraf: Benny Lennert Other Clinician: Referring Mahalie Kanner: Treating Handsome Anglin/Extender:Robson, Shayne Alken, LISA Weeks in Treatment: 0 Wound Status Wound Number: 2 Primary Diabetic Wound/Ulcer of the Lower Extremity Etiology: Etiology: Wound Location: Right Toe Great Wound Open Wounding Event:  Gradually Appeared Status: Date Acquired: 06/15/2019 Comorbid Cataracts, Arrhythmia, Hypertension, Weeks Of Treatment: 0 History: Peripheral Arterial Disease, Type II Diabetes, Clustered Wound: No Osteomyelitis, Dementia, Neuropathy, Received Chemotherapy Wound Measurements Length: (cm) 0.3 % Reductio Width: (cm) 0.4 % Reductio Depth: (cm) 0.3 Epithelial  Area: (cm) 0.094 Tunneling Volume: (cm) 0.028 Undermini Wound Description Classification: Grade 2 Foul Od Wound Margin: Distinct, outline attached Slough/ Exudate Amount: Small Exudate Type: Serosanguineous Exudate Color: red, brown Wound Bed Granulation Amount: Small (1-33%) Granulation Quality: Pink Fascia Necrotic Amount: Small (1-33%) Fat Lay Necrotic Quality: Adherent Slough Tendon Muscle Joint E Bone Ex or After Cleansing: No Fibrino No Exposed Structure Exposed: No er (Subcutaneous Tissue) Exposed: Yes Exposed: No Exposed: No xposed: No posed: Yes n in Area: n in Volume: ization: None : No ng: No Electronic Signature(s) Signed: 07/25/2019 6:21:28 PM By: Baruch Gouty RN, BSN Entered By: Baruch Gouty on 07/25/2019 11:47:28 -------------------------------------------------------------------------------- [place] Details Patient Name: Date of Service: [name] 07/25/2019 10:30 AM Medical Record [id number] Patient Account Number: [id number] Date of Birth/Sex: Treating RN: September 18, 1923 (86 y.o. [name]) Carlene Coria Primary Care Kristiane Morsch: Benny Lennert Other Clinician: Referring Asenath Balash: Treating Layth Cerezo/Extender:Robson, Shayne Alken, LISA Weeks in Treatment: 0 Vital Signs Time Taken: 11:21 Temperature (F): 97.7 Height (in): 70 Pulse (bpm): 75 Source: Stated Respiratory Rate (breaths/min): 18 Weight (lbs): 140 Blood Pressure (mmHg): 106/62 Source: Stated Capillary Blood Glucose (mg/dl): 104 Body Mass Index (BMI): 20.1 Reference Range: 80 - 120 mg / dl Notes glucose per pt report this  am Electronic Signature(s) Signed: 07/25/2019 6:21:28 PM By: Baruch Gouty RN, BSN Entered By: Baruch Gouty on 07/25/2019 11:35:04

## 2019-08-01 NOTE — Progress Notes (Signed)
[name], [name] ([id number]) Visit Report for 07/31/2019 Arrival Information Details Patient Name: Date of Service: [name], [name] 07/31/2019 3:30 PM Medical Record [id number] Patient Account Number: [id number] Date of Birth/Sex: Treating RN: 1924-04-14 (86 y.o. [name] Primary Care Bryer Gottsch: Benny Lennert Other Clinician: Referring Costantino Kohlbeck: Treating Tymier Lindholm/Extender:Robson, Shayne Alken, LISA Weeks in Treatment: 0 Visit Information History Since Last Visit Added or deleted any medications: No Patient Arrived: Wheel Chair Any new allergies or adverse reactions: No Arrival Time: 16:19 Had a fall or experienced change in No Accompanied By: family member activities of daily living that may affect Transfer Assistance: None risk of falls: Patient Identification Verified: Yes Signs or symptoms of abuse/neglect since last No Secondary Verification Process Yes visito Completed: Hospitalized since last visit: No Patient Has Alerts: Yes Implantable device outside of the clinic excluding No Patient Alerts: R ABI non cellular tissue based products placed in the center compressible since last visit: Has Dressing in Place as Prescribed: Yes Pain Present Now: No Electronic Signature(s) Signed: 07/31/2019 6:12:10 PM By: Levan Hurst RN, BSN Entered By: Levan Hurst on 07/31/2019 17:12:09 -------------------------------------------------------------------------------- Encounter Discharge Information Details Patient Name: Date of Service: [name] 07/31/2019 3:30 PM Medical Record [id number] Patient Account Number: [id number] Date of Birth/Sex: Treating RN: Feb 13, 1924 (86 y.o. [name] Primary Care Liborio Saccente: Benny Lennert Other Clinician: Referring Terri Malerba: Treating Momodou Consiglio/Extender:Robson, Shayne Alken, LISA Weeks in Treatment: 0 Encounter Discharge Information Items Post Procedure Vitals Discharge Condition: Stable Temperature  (F): 98.3 Ambulatory Status: Wheelchair Pulse (bpm): 50 Discharge Destination: Home Respiratory Rate (breaths/min): 18 Transportation: Private Auto Blood Pressure (mmHg): 125/48 Accompanied By: family member Schedule Follow-up Appointment: Yes Clinical Summary of Care: Patient Declined Electronic Signature(s) Signed: 07/31/2019 5:40:42 PM By: [name] Entered By: [name] on 07/31/2019 17:22:13 -------------------------------------------------------------------------------- Lower Extremity Assessment Details Patient Name: Date of Service: [name], [name] 07/31/2019 3:30 PM Medical Record [id number] Patient Account Number: [id number] Date of Birth/Sex: Treating RN: 1923-06-27 (86 y.o. [name] Primary Care Anyela Napierkowski: Benny Lennert Other Clinician: Referring Kymia Simi: Treating Nare Gaspari/Extender:Robson, Shayne Alken, LISA Weeks in Treatment: 0 Edema Assessment Assessed: [Left: No] [Right: No] Edema: [Left: N] [Right: o] Calf Left: Right: Point of Measurement: cm From Medial Instep cm 30.5 cm Ankle Left: Right: Point of Measurement: cm From Medial Instep cm 20.6 cm Vascular Assessment Pulses: Dorsalis Pedis Palpable: [Right:Yes] Electronic Signature(s) Signed: 07/31/2019 5:40:42 PM By: [name] Entered By: [name] on 07/31/2019 16:20:52 -------------------------------------------------------------------------------- Multi Wound Chart Details Patient Name: Date of Service: [name] 07/31/2019 3:30 PM Medical Record [id number] Patient Account Number: [id number] Date of Birth/Sex: Treating RN: 09/16/1923 (86 y.o. 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] Comorbid History: [2:Cataracts, Arrhythmia, Hypertension, Peripheral Arterial Disease, Type II Diabetes, Osteomyelitis, Dementia, Neuropathy, Received Chemotherapy] [N/A:N/A] Date Acquired: [2:06/15/2019] [N/A:N/A] Weeks of Treatment: [2:0] [N/A:N/A] Wound Status: [2:Open] [N/A:N/A] Measurements L x W x D 0.4x0.4x0.1 [N/A:N/A] (cm) Area (cm) : [2:0.126] [N/A:N/A] Volume (cm) : [2:0.013] [N/A:N/A] % Reduction in Area: [2:-34.00%] [N/A:N/A] % Reduction in Volume: 53.60% [N/A:N/A] Classification: [2:Grade 2] [N/A:N/A] Exudate Amount: [2:Small] [N/A:N/A] Exudate Type: [2:Serosanguineous] [N/A:N/A] Exudate Color: [2:red, brown] [N/A:N/A] Wound Margin: [2:Distinct, outline attached N/A]  Granulation Amount: [2:Large (67-100%)] [N/A:N/A] Granulation Quality: [2:Red, Pink] [N/A:N/A] Necrotic Amount: [2:None Present (0%)] [N/A:N/A] Exposed Structures: [2:Fat Layer (Subcutaneous N/A Tissue) Exposed: Yes Bone: Yes Fascia: No Tendon: No Muscle: No Joint: No] Epithelialization: [2:None] [N/A:N/A] Debridement: [2:Debridement - Excisional N/A] Pre-procedure [2:17:07] [N/A:N/A] Verification/Time Out Taken: Tissue Debrided: [2:Bone] [N/A:N/A] Level: [2:Skin/Subcutaneous Tissue/Muscle/Bone] [N/A:N/A] Debridement Area (sq cm):0.16 [N/A:N/A] Instrument: [2:Curette] [N/A:N/A] Bleeding: [2:Minimum] [N/A:N/A] Hemostasis Achieved: [2:Pressure] [N/A:N/A] Procedural Pain: [2:0] [N/A:N/A] Post Procedural Pain: [2:0] [N/A:N/A] Debridement Treatment [2:Procedure was tolerated] [N/A:N/A] Response: [2:well] Post Debridement [2:0.4x0.4x0.1] [N/A:N/A] Measurements L x W x D (cm) Post Debridement [2:0.013] [N/A:N/A] Volume: (cm) Procedures Performed: [2:Debridement]  [N/A:N/A] Treatment Notes Wound #2 (Right Toe Great) 1. Cleanse With Wound Cleanser 2. Periwound Care Skin Prep 3. Primary Dressing Applied Collegen AG Hydrogel or K-Y Jelly 4. Secondary Dressing Dry Gauze Notes netting Electronic Signature(s) Signed: 07/31/2019 6:12:10 PM By: Levan Hurst RN, BSN Signed: 08/01/2019 5:51:00 PM By: Linton Ham MD Entered By: Linton Ham on 07/31/2019 17:44:29 -------------------------------------------------------------------------------- Multi-Disciplinary Care Plan Details Patient Name: Date of Service: [name], [name] 07/31/2019 3:30 PM Medical Record [id number] Patient Account Number: [id number] Date of Birth/Sex: Treating RN: 08/31/1923 (86 y.o. [name] Primary Care Coralyn Roselli: Benny Lennert Other Clinician: Referring Ariannie Penaloza: Treating Bynum Mccullars/Extender:Robson, Shayne Alken, LISA Weeks in Treatment: 0 Active Inactive Wound/Skin Impairment Nursing Diagnoses: Knowledge deficit related to ulceration/compromised skin integrity Goals: Patient/caregiver will verbalize understanding of skin care regimen Date Initiated: 07/25/2019 Target Resolution Date: 08/25/2019 Goal Status: Active Ulcer/skin breakdown will have a volume reduction of 30% by week 4 Date Initiated: 07/25/2019 Target Resolution Date: 08/25/2019 Goal Status: Active Interventions: Assess patient/caregiver ability to obtain necessary supplies Assess patient/caregiver ability to perform ulcer/skin care regimen upon admission and as needed Assess ulceration(s) every visit Notes: Electronic Signature(s) Signed: 07/31/2019 6:12:10 PM By: Levan Hurst RN, BSN Entered By: Levan Hurst on 07/31/2019 17:12:44 -------------------------------------------------------------------------------- Pain Assessment Details Patient Name: Date of Service: [name] 07/31/2019 3:30 PM Medical Record [id number] Patient Account Number: [id number] Date of  Birth/Sex: Treating RN: 1924/03/13 (86 y.o. [name] Primary Care Nikelle Malatesta: Benny Lennert Other Clinician: Referring Lenin Kuhnle: Treating Oletta Buehring/Extender:Robson, Shayne Alken, LISA Weeks in Treatment: 0 Active Problems Location of Pain Severity and Description of Pain Patient Has Paino No Site Locations Pain Management and Medication Current Pain Management: Electronic Signature(s) Signed: 07/31/2019 5:40:42 PM By: [name] Entered By: [name] on 07/31/2019 16:20:31 -------------------------------------------------------------------------------- Patient/Caregiver Education Details Patient Name: Date of Service: [name] 4/12/2021andnbsp3:30 PM Medical Record [phone] Patient Account Number: [id number] Date of Birth/Gender: 01-May-1923 (86 y.o. M) Treating RN: Levan Hurst Primary Care Physician: Benny Lennert Other Clinician: Referring Physician: Treating Physician/Extender:Robson, Shayne Alken, LISA Weeks in Treatment: 0 Education Assessment Education Provided To: Patient Education Topics Provided Wound/Skin Impairment: Methods: Explain/Verbal Responses: State content correctly Electronic Signature(s) Signed: 07/31/2019 6:12:10 PM By: Levan Hurst RN, BSN Entered By: Levan Hurst on 07/31/2019 17:12:56 -------------------------------------------------------------------------------- Wound Assessment Details Patient Name: Date of Service: [name], [name] 07/31/2019 3:30 PM Medical Record [id number] Patient Account Number: [id number] Date of Birth/Sex: Treating RN: January 12, 1924 (86 y.o. [name] Primary Care Neysha Criado: Benny Lennert Other Clinician: Referring Xerxes Agrusa: Treating Ronee Ranganathan/Extender:Robson, Shayne Alken, LISA Weeks in Treatment: 0 Wound Status Wound Number: 2 Primary Diabetic Wound/Ulcer of the Lower Extremity Etiology: Wound Location: Right Toe Great Wound Open Wounding Event: Gradually  Appeared Status: Date Acquired: 06/15/2019 Comorbid Cataracts, Arrhythmia, Hypertension, Weeks Of Treatment: 0 History: Peripheral Arterial Disease, Type II Diabetes, Clustered Wound: No Osteomyelitis, Dementia, Neuropathy, Received Chemotherapy  Wound Measurements Length: (cm) 0.4 Width: (cm) 0.4 Depth: (cm) 0.1 Area: (cm) 0.126 Volume: (cm) 0.013 Wound Description Classification: Grade 2 Wound Margin: Distinct, outline attached Exudate Amount: Small Exudate Type: Serosanguineous Exudate Color: red, brown Wound Bed Granulation Amount: Large (67-100%) Granulation Quality: Red, Pink Necrotic Amount: None Present (0%) After Cleansing: No rino No Exposed Structure osed: No (Subcutaneous Tissue) Exposed: Yes osed: No osed: No sed: No ed: Yes % Reduction in Area: -34% % Reduction in Volume: 53.6% Epithelialization: None Tunneling: No Undermining: No Foul Odor Slough/Fib Fascia Exp Fat Layer Tendon Exp Muscle Exp Joint Expo Bone Expos Treatment Notes Wound #2 (Right Toe Great) 1. Cleanse With Wound Cleanser 2. Periwound Care Skin Prep 3. Primary Dressing Applied Collegen AG Hydrogel or K-Y Jelly 4. Secondary Dressing Dry Gauze Notes netting Electronic Signature(s) Signed: 07/31/2019 5:40:42 PM By: [name] Entered By: [name] on 07/31/2019 16:24:32 -------------------------------------------------------------------------------- Vitals Details Patient Name: Date of Service: [name] 07/31/2019 3:30 PM Medical Record [id number] Patient Account Number: [id number] Date of Birth/Sex: Treating RN: 1924/02/20 (86 y.o. [name] Primary Care Stephanne Greeley: Benny Lennert Other Clinician: Referring Merdith Boyd: Treating Irish Piech/Extender:Robson, Shayne Alken, LISA Weeks in Treatment: 0 Vital Signs Time Taken: 16:19 Temperature (F): 98.3 Height (in): 70 Pulse (bpm): 50 Weight (lbs): 140 Respiratory Rate (breaths/min):  18 Body Mass Index (BMI): 20.1 Blood Pressure (mmHg): 125/48 Reference Range: 80 - 120 mg / dl Electronic Signature(s) Signed: 07/31/2019 5:40:42 PM By: [name] Entered By: [name] on 07/31/2019 16:21:22

## 2019-08-01 NOTE — Progress Notes (Signed)
[name], [name] ([id number]) Visit Report for 07/31/2019 Debridement Details Patient Name: Date of Service: [name], Foster 07/31/2019 3:30 PM Medical Record [id number] Patient Account Number: [id number] Date of Birth/Sex: Treating RN: [birth year] (86 y.o. [name] Primary Care Provider: Benny Foster Other Clinician: Referring Provider: Treating Provider/Extender:[name], [name], [name] in Treatment: 0 Debridement Performed for Wound #2 Right Toe Great Assessment: Performed By: Physician [name]., MD Debridement Type: Debridement Severity of Tissue Pre Fat layer exposed Debridement: Level of Consciousness (Pre- Awake and Alert procedure): Pre-procedure Verification/Time Out Taken: Yes - 17:07 Start Time: 17:07 Total Area Debrided (L x W): 0.4 (cm) x 0.4 (cm) = 0.16 (cm) Tissue and other material Non-Viable, Bone debrided: Level: Skin/Subcutaneous Tissue/Muscle/Bone Debridement Description: Excisional Instrument: Curette Bleeding: Minimum Hemostasis Achieved: Pressure End Time: 17:08 Procedural Pain: 0 Post Procedural Pain: 0 Response to Treatment: Procedure was tolerated well Level of Consciousness Awake and Alert (Post-procedure): Post Debridement Measurements of Total Wound Length: (cm) 0.4 Width: (cm) 0.4 Depth: (cm) 0.1 Volume: (cm) 0.013 Character of Wound/Ulcer Post Improved Debridement: Severity of Tissue Post Debridement: Fat layer exposed Post Procedure Diagnosis Same as Pre-procedure Electronic Signature(s) Signed: 07/31/2019 6:12:10 PM By: [name] Hurst RN, BSN Signed: 08/01/2019 5:51:00 PM By: [name] Ham MD Entered By: [name] on 07/31/2019 17:44:38 -------------------------------------------------------------------------------- HPI Details Patient Name: Date of Service: [name] 07/31/2019 3:30 PM Medical Record [id number] Patient Account Number: [id number] Date of Birth/Sex: Treating  RN: [name] (86 y.o. [name] Primary Care Provider: Benny Foster Other Clinician: Referring Provider: Treating Provider/Extender:[name], [name], [name] in Treatment: 0 History of Present Illness HPI Description: ADMISSION 05/02/2019 This is a 86 year old man accompanied by his daughter. He lives in [place] with his elderly wife. He has home caregivers as well. Apparently on [name] 10, 2020 they noted blood on his sock and noticed a wound on the tip of his right great toe. He has been cared for by Dr. Posey Pronto who is his podiatrist in [place]. Apparently they have been using Betadine to this area. The patient has a thick mycotic nail on the right as well which is embedded in the wound in some areas. Fortunately the patient has diabetic neuropathy and is not really experiencing any pain otherwise I think this would all be quite painful. The patient was previously treated in 2019 for wounds on the left foot. This included a fairly thorough vascular review by vein and vascular Dr. Oneida Alar. His ABIs at that time were noncompressible bilaterally TBI on the right was 0.88 with biphasic waveforms on the right and monophasic waveforms at the left. He ended up having amputations of the left first and second toes. He did have an angiogram on 06/25/2017 which showed on the right the right common femoral profundofemoral for Morris and superficial femoral arteries were widely patent the right popliteal was widely patent. Tibial vessels were not well opacified secondary to motion artifact however he was felt to have all 3 tibial vessels intact with some calcification and some areas of stenosis within the posterior tibial artery but relatively good flow to the right lower leg. He also had the left reviewed as well which was the source of the problem at that time. Past medical history; prostate cancer on oral antiandrogen therapy with bone mets, GI stromal tumor in 2005,  type 2 diabetes with peripheral neuropathy, hypertension, atrial fibrillation on Eliquis, history of non-Hodgkin's lymphoma treated with CHOP in 2005. He had amputations of the left first and  second toes secondary to osteomyelitis. Does not appear that he has had an x-ray of the right foot ABI in our clinic on the right was noncompressible [see full arterial discussion above]. 1/19; area on the tip of the right great toe under a very mycotic nail tip. We use silver alginate. 1/26; area of the tip of the right great toe under a very mycotic nail. A lot of this seems to have closed down we have been using silver alginate 2/9; the areas on the tip of the right great toe under a very mycotic nail. A lot of this continues to close down. I do not think there is a subungual problem. I am hopeful that this mycotic nail which is thick and raised will allow full epithelialization of the wound 2/23; this was a difficult area on the tip of his right great toe. Thick mycotic nail over the base of the wound. This appears to have closed down now. The nail itself is thick split with considerable subungual debris. READMISSION 07/25/2019 This is a 86 year old man that we cared for earlier this year in the clinic without a wound on the tip of his right great toe under a very mycotic toenail. He is a type II diabetic. His ABIs have been noncompressible however he has been felt to have adequate blood flow in the right to heal the wound. We were able to get this wound to close over as I understand things he followed with Dr. Caprice Beaver had some debridement of the nail everything looks fine and apparently the wound bed was washed off by his wife over the weekend and by Monday it was open. His daughter is able to show me pictures. Unfortunately this now has exposed bone raising the possibility that he has had underlying osteomyelitis all along. He has been using Silvadene cream on this he has been referred back to  the clinic by Dr. Caprice Beaver. He had x-rays of the right foot that did not show evidence of osteomyelitis in the right foot. He has not been on antibiotics He also had an x-ray of the left foot that showed findings suspicious for osteomyelitis involving the residual base of the first proximal phalanx and the proximal phalanx of the left second toe as well as the left second metatarsal head however he has no wounds in this area. 4/12; culture from last time of bone in the right great toe was negative. The patient's wife apparently scrubbed this toe vigorously and according to his daughter reopen the wound however unfortunately it is all exposed bone last time. I removed some of this and we use Prisma unfortunately the area does not look much better today. Previous x-rays done in podiatry office Dr. Caprice Beaver did not show evidence of osteomyelitis. Nevertheless I think osteomyelitis here is probably quite likely. Still using Prisma Electronic Signature(s) Signed: 08/01/2019 5:51:00 PM By: [name] Ham MD Entered By: [name] on 07/31/2019 17:45:47 -------------------------------------------------------------------------------- Physical Exam Details Patient Name: Date of Service: MICAIAH, SCHUHMACHER 07/31/2019 3:30 PM Medical Record [id number] Patient Account Number: [id number] Date of Birth/Sex: Treating RN: [birth year]oster (86 y.o. [name] Primary Care Provider: Benny Foster Other Clinician: Referring Provider: Treating Provider/Extender:Gracelee Stemmler, [name], [name] in Treatment: 0 Constitutional Sitting or standing Blood Pressure is within target range for patient.. Pulse regular and within target range for patient.Marland Kitchen Respirations regular, non-labored and within target range.. Temperature is normal and within the target range for the patient.Marland Kitchen Appears in no distress. Cardiovascular His dorsalis pedis pulses and  posterior tibial pulses in the right foot are palpable but  certainly not vibrant. Notes Wound exam; indeed a continued difficult wound to the tip of the right first toe. Again there is still exposed bone which I attempted to debride back to give [place] some area for granulation over the top nevertheless I do not know that I have made that much progress. There is no clear evidence of infection Electronic Signature(s) Signed: 08/01/2019 5:51:00 PM By: [name] Ham MD Entered By: [name] on 07/31/2019 17:46:56 -------------------------------------------------------------------------------- Physician Orders Details Patient Name: Date of Service: FORESTER, ENDRIZZI 07/31/2019 3:30 PM Medical Record [id number] Patient Account Number: [id number] Date of Birth/Sex: Treating RN: [birth year] (86 y.o. [name] Primary Care Provider: Benny Foster Other Clinician: Referring Provider: Treating Provider/Extender:Dosha Broshears, [name], [name] in Treatment: 0 Verbal / Phone Orders: No Diagnosis Coding ICD-10 Coding Code Description E11.621 Type 2 diabetes mellitus with foot ulcer L97.514 Non-pressure chronic ulcer of other part of right foot with necrosis of bone E11.40 Type 2 diabetes mellitus with diabetic neuropathy, unspecified Follow-up Appointments Return Appointment in 2 Foster. Dressing Change Frequency Wound #2 Right Toe Great Change Dressing every other day. Wound Cleansing May shower and wash wound with soap and water. - on days that dressing is changed Primary Wound Dressing Wound #2 Right Toe Great Silver Collagen - moisten with hydrogel or KY jelly Secondary Dressing Wound #2 Right Toe Great Kerlix/Rolled Gauze Dry [place] skilled nursing for wound care. - [facility] Patient Medications Allergies: Augmentin Notifications Medication Indication Start End doxycycline monohydrate wound infection 07/31/2019 DOSE oral 100 mg capsule - 1 capsule oral bid for 2 Foster Electronic  Signature(s) Signed: 07/31/2019 5:52:26 PM By: [name] Ham MD Entered By: [name] on 07/31/2019 17:52:25 -------------------------------------------------------------------------------- Problem List Details Patient Name: Date of Service: [name] 07/31/2019 3:30 PM Medical Record [id number] Patient Account Number: [id number] Date of Birth/Sex: Treating RN: [birth year] (86 y.o. [name] Primary Care Provider: Benny Foster Other Clinician: Referring Provider: Treating Provider/Extender:Mikayla Chiusano, [name], [name] in Treatment: 0 Active Problems ICD-10 Evaluated Encounter Code Description Active Date Today Diagnosis E11.621 Type 2 diabetes mellitus with foot ulcer 07/25/2019 No Yes L97.514 Non-pressure chronic ulcer of other part of right foot 07/25/2019 No Yes with necrosis of bone E11.40 Type 2 diabetes mellitus with diabetic neuropathy, 07/25/2019 No Yes unspecified Inactive Problems Resolved Problems Electronic Signature(s) Signed: 08/01/2019 5:51:00 PM By: [name] Ham MD Entered By: [name] on 07/31/2019 17:44:17 -------------------------------------------------------------------------------- Progress Note Details Patient Name: Date of Service: [name] 07/31/2019 3:30 PM Medical Record [id number] Patient Account Number: [id number] Date of Birth/Sex: Treating RN: [birth year] (86 y.o. [name] Primary Care Provider: Benny Foster Other Clinician: Referring Provider: Treating Provider/Extender:Bern Fare, [name], [name] in Treatment: 0 Subjective History of Present Illness (HPI) ADMISSION 05/02/2019 This is a 86 year old man accompanied by his daughter. He lives in [place] with his elderly wife. He has home caregivers as well. Apparently on [name] 10, 2020 they noted blood on his sock and noticed a wound on the tip of his right great toe. He has been cared for by Dr. Posey Pronto who is his  podiatrist in [place]. Apparently they have been using Betadine to this area. The patient has a thick mycotic nail on the right as well which is embedded in the wound in some areas. Fortunately the patient has diabetic neuropathy and is not really experiencing any pain otherwise I think this would all be quite  painful. The patient was previously treated in 2019 for wounds on the left foot. This included a fairly thorough vascular review by vein and vascular Dr. Oneida Alar. His ABIs at that time were noncompressible bilaterally TBI on the right was 0.88 with biphasic waveforms on the right and monophasic waveforms at the left. He ended up having amputations of the left first and second toes. He did have an angiogram on 06/25/2017 which showed on the right the right common femoral profundofemoral for Morris and superficial femoral arteries were widely patent the right popliteal was widely patent. Tibial vessels were not well opacified secondary to motion artifact however he was felt to have all 3 tibial vessels intact with some calcification and some areas of stenosis within the posterior tibial artery but relatively good flow to the right lower leg. He also had the left reviewed as well which was the source of the problem at that time. Past medical history; prostate cancer on oral antiandrogen therapy with bone mets, GI stromal tumor in 2005, type 2 diabetes with peripheral neuropathy, hypertension, atrial fibrillation on Eliquis, history of non-Hodgkin's lymphoma treated with CHOP in 2005. He had amputations of the left first and second toes secondary to osteomyelitis. Does not appear that he has had an x-ray of the right foot ABI in our clinic on the right was noncompressible [see full arterial discussion above]. 1/19; area on the tip of the right great toe under a very mycotic nail tip. We use silver alginate. 1/26; area of the tip of the right great toe under a very mycotic nail. A lot of this  seems to have closed down we have been using silver alginate 2/9; the areas on the tip of the right great toe under a very mycotic nail. A lot of this continues to close down. I do not think there is a subungual problem. I am hopeful that this mycotic nail which is thick and raised will allow full epithelialization of the wound 2/23; this was a difficult area on the tip of his right great toe. Thick mycotic nail over the base of the wound. This appears to have closed down now. The nail itself is thick split with considerable subungual debris. READMISSION 07/25/2019 This is a 86 year old man that we cared for earlier this year in the clinic without a wound on the tip of his right great toe under a very mycotic toenail. He is a type II diabetic. His ABIs have been noncompressible however he has been felt to have adequate blood flow in the right to heal the wound. We were able to get this wound to close over as I understand things he followed with Dr. Caprice Beaver had some debridement of the nail everything looks fine and apparently the wound bed was washed off by his wife over the weekend and by Monday it was open. His daughter is able to show me pictures. Unfortunately this now has exposed bone raising the possibility that he has had underlying osteomyelitis all along. He has been using Silvadene cream on this he has been referred back to the clinic by Dr. Caprice Beaver. He had x-rays of the right foot that did not show evidence of osteomyelitis in the right foot. He has not been on antibiotics He also had an x-ray of the left foot that showed findings suspicious for osteomyelitis involving the residual base of the first proximal phalanx and the proximal phalanx of the left second toe as well as the left second metatarsal head however he has no  wounds in this area. 4/12; culture from last time of bone in the right great toe was negative. The patient's wife apparently scrubbed this toe vigorously and  according to his daughter reopen the wound however unfortunately it is all exposed bone last time. I removed some of this and we use Prisma unfortunately the area does not look much better today. Previous x-rays done in podiatry office Dr. Caprice Beaver did not show evidence of osteomyelitis. Nevertheless I think osteomyelitis here is probably quite likely. Still using Prisma Objective Constitutional Sitting or standing Blood Pressure is within target range for patient.. Pulse regular and within target range for patient.Marland Kitchen Respirations regular, non-labored and within target range.. Temperature is normal and within the target range for the patient.Marland Kitchen Appears in no distress. Vitals Time Taken: 4:19 PM, Height: 70 in, Weight: 140 lbs, BMI: 20.1, Temperature: 98.3 F, Pulse: 50 bpm, Respiratory Rate: 18 breaths/min, Blood Pressure: 125/48 mmHg. Cardiovascular His dorsalis pedis pulses and posterior tibial pulses in the right foot are palpable but certainly not vibrant. General Notes: Wound exam; indeed a continued difficult wound to the tip of the right first toe. Again there is still exposed bone which I attempted to debride back to give [place] some area for granulation over the top nevertheless I do not know that I have made that much progress. There is no clear evidence of infection Integumentary (Hair, Skin) Wound #2 status is Open. Original cause of wound was Gradually Appeared. The wound is located on the Right Toe Great. The wound measures 0.4cm length x 0.4cm width x 0.1cm depth; 0.126cm^2 area and 0.013cm^3 volume. There is bone and Fat Layer (Subcutaneous Tissue) Exposed exposed. There is no tunneling or undermining noted. There is a small amount of serosanguineous drainage noted. The wound margin is distinct with the outline attached to the wound base. There is large (67-100%) red, pink granulation within the wound bed. There is no necrotic tissue within the wound bed. Assessment Active  Problems ICD-10 Type 2 diabetes mellitus with foot ulcer Non-pressure chronic ulcer of other part of right foot with necrosis of bone Type 2 diabetes mellitus with diabetic neuropathy, unspecified Procedures Wound #2 Pre-procedure diagnosis of Wound #2 is a Diabetic Wound/Ulcer of the Lower Extremity located on the Right Toe Great .Severity of Tissue Pre Debridement is: Fat layer exposed. There was a Excisional Skin/Subcutaneous Tissue/Muscle/Bone Debridement with a total area of 0.16 sq cm performed by [name]., MD. With the following instrument(s): Curette to remove Non-Viable tissue/material. Material removed includes Bone. No specimens were taken. A time out was conducted at 17:07, prior to the start of the procedure. A Minimum amount of bleeding was controlled with Pressure. The procedure was tolerated well with a pain level of 0 throughout and a pain level of 0 following the procedure. Post Debridement Measurements: 0.4cm length x 0.4cm width x 0.1cm depth; 0.013cm^3 volume. Character of Wound/Ulcer Post Debridement is improved. Severity of Tissue Post Debridement is: Fat layer exposed. Post procedure Diagnosis Wound #2: Same as Pre-Procedure Plan Follow-up Appointments: Return Appointment in 2 Foster. Dressing Change Frequency: Wound #2 Right Toe Great: Change Dressing every other day. Wound Cleansing: May shower and wash wound with soap and water. - on days that dressing is changed Primary Wound Dressing: Wound #2 Right Toe Great: Silver Collagen - moisten with hydrogel or KY jelly Secondary Dressing: Wound #2 Right Toe Great: Kerlix/Rolled Gauze Dry Gauze Home Health: [place] skilled nursing for wound care. - [facility] The following medication(s) was  prescribed: doxycycline monohydrate oral 100 mg capsule 1 capsule oral bid for 2 Foster for wound infection starting 07/31/2019 1. I am going to put him on empiric antibiotics. I had wanted to use a  quinolone but there were too many drug interactions I started him on doxycycline 100 twice daily for 2 Foster. I think the likelihood of underlying osteomyelitis is quite high in the setting 2. Continue with silver collagen 3. Follow-up in 2 Foster Electronic Signature(s) Signed: 08/01/2019 5:51:00 PM By: [name] Ham MD Entered By: [name] on 07/31/2019 17:53:10 -------------------------------------------------------------------------------- SuperBill Details Patient Name: Date of Service: [name] 07/31/2019 Medical Record [id number] Patient Account Number: [id number] Date of Birth/Sex: Treating RN: [birth year] Foster (86 y.o. [name] Primary Care Provider: Benny Foster Other Clinician: Referring Provider: Treating Provider/Extender:Angala Hilgers, [name], [name] in Treatment: 0 Diagnosis Coding ICD-10 Codes Code Description E11.621 Type 2 diabetes mellitus with foot ulcer L97.514 Non-pressure chronic ulcer of other part of right foot with necrosis of bone E11.40 Type 2 diabetes mellitus with diabetic neuropathy, unspecified Facility Procedures CPT4 Code: [id number] Description: [id number] - DEB BONE 20 SQ CM/< ICD-10 Diagnosis Description L97.514 Non-pressure chronic ulcer of other part of right foot Modifier: with necrosis of b Quantity: 1 one Physician Procedures CPT4 Description: Code [id number] Debridement; bone (includes epidermis, dermis, subQ tissue, muscle and/or fascia, if performed) 1st 20 sqcm or less ICD-10 Diagnosis Description L97.514 Non-pressure chronic ulcer of other part of right foot with necro Modifier: sis of bone Quantity: 1 Electronic Signature(s) Signed: 08/01/2019 5:51:00 PM By: [name] Ham MD Entered By: [name] on 07/31/2019 17:53:27

## 2019-08-02 DIAGNOSIS — R269 Unspecified abnormalities of gait and mobility: Secondary | ICD-10-CM | POA: Diagnosis not present

## 2019-08-02 DIAGNOSIS — L97519 Non-pressure chronic ulcer of other part of right foot with unspecified severity: Secondary | ICD-10-CM | POA: Diagnosis not present

## 2019-08-02 DIAGNOSIS — I10 Essential (primary) hypertension: Secondary | ICD-10-CM | POA: Diagnosis not present

## 2019-08-02 DIAGNOSIS — C8581 Other specified types of non-Hodgkin lymphoma, lymph nodes of head, face, and neck: Secondary | ICD-10-CM | POA: Diagnosis not present

## 2019-08-02 DIAGNOSIS — E1165 Type 2 diabetes mellitus with hyperglycemia: Secondary | ICD-10-CM | POA: Diagnosis not present

## 2019-08-02 DIAGNOSIS — C61 Malignant neoplasm of prostate: Secondary | ICD-10-CM | POA: Diagnosis not present

## 2019-08-04 DIAGNOSIS — R269 Unspecified abnormalities of gait and mobility: Secondary | ICD-10-CM | POA: Diagnosis not present

## 2019-08-04 DIAGNOSIS — L97519 Non-pressure chronic ulcer of other part of right foot with unspecified severity: Secondary | ICD-10-CM | POA: Diagnosis not present

## 2019-08-04 DIAGNOSIS — C61 Malignant neoplasm of prostate: Secondary | ICD-10-CM | POA: Diagnosis not present

## 2019-08-04 DIAGNOSIS — E1165 Type 2 diabetes mellitus with hyperglycemia: Secondary | ICD-10-CM | POA: Diagnosis not present

## 2019-08-04 DIAGNOSIS — C8581 Other specified types of non-Hodgkin lymphoma, lymph nodes of head, face, and neck: Secondary | ICD-10-CM | POA: Diagnosis not present

## 2019-08-04 DIAGNOSIS — I10 Essential (primary) hypertension: Secondary | ICD-10-CM | POA: Diagnosis not present

## 2019-08-07 DIAGNOSIS — R269 Unspecified abnormalities of gait and mobility: Secondary | ICD-10-CM | POA: Diagnosis not present

## 2019-08-07 DIAGNOSIS — I10 Essential (primary) hypertension: Secondary | ICD-10-CM | POA: Diagnosis not present

## 2019-08-07 DIAGNOSIS — L97519 Non-pressure chronic ulcer of other part of right foot with unspecified severity: Secondary | ICD-10-CM | POA: Diagnosis not present

## 2019-08-07 DIAGNOSIS — E1165 Type 2 diabetes mellitus with hyperglycemia: Secondary | ICD-10-CM | POA: Diagnosis not present

## 2019-08-07 DIAGNOSIS — C61 Malignant neoplasm of prostate: Secondary | ICD-10-CM | POA: Diagnosis not present

## 2019-08-07 DIAGNOSIS — C8581 Other specified types of non-Hodgkin lymphoma, lymph nodes of head, face, and neck: Secondary | ICD-10-CM | POA: Diagnosis not present

## 2019-08-09 ENCOUNTER — Inpatient Hospital Stay (HOSPITAL_COMMUNITY): Payer: Medicare Other | Attending: Hematology | Admitting: Hematology

## 2019-08-09 ENCOUNTER — Other Ambulatory Visit: Payer: Self-pay

## 2019-08-09 ENCOUNTER — Inpatient Hospital Stay (HOSPITAL_COMMUNITY): Payer: Medicare Other

## 2019-08-09 ENCOUNTER — Encounter (HOSPITAL_COMMUNITY): Payer: Self-pay | Admitting: Hematology

## 2019-08-09 DIAGNOSIS — C7951 Secondary malignant neoplasm of bone: Secondary | ICD-10-CM | POA: Diagnosis not present

## 2019-08-09 DIAGNOSIS — Z7901 Long term (current) use of anticoagulants: Secondary | ICD-10-CM | POA: Insufficient documentation

## 2019-08-09 DIAGNOSIS — I4891 Unspecified atrial fibrillation: Secondary | ICD-10-CM | POA: Insufficient documentation

## 2019-08-09 DIAGNOSIS — D472 Monoclonal gammopathy: Secondary | ICD-10-CM | POA: Insufficient documentation

## 2019-08-09 DIAGNOSIS — R269 Unspecified abnormalities of gait and mobility: Secondary | ICD-10-CM | POA: Diagnosis not present

## 2019-08-09 DIAGNOSIS — Z7984 Long term (current) use of oral hypoglycemic drugs: Secondary | ICD-10-CM | POA: Insufficient documentation

## 2019-08-09 DIAGNOSIS — Z7952 Long term (current) use of systemic steroids: Secondary | ICD-10-CM | POA: Insufficient documentation

## 2019-08-09 DIAGNOSIS — Z8509 Personal history of malignant neoplasm of other digestive organs: Secondary | ICD-10-CM | POA: Insufficient documentation

## 2019-08-09 DIAGNOSIS — E1136 Type 2 diabetes mellitus with diabetic cataract: Secondary | ICD-10-CM | POA: Insufficient documentation

## 2019-08-09 DIAGNOSIS — E1165 Type 2 diabetes mellitus with hyperglycemia: Secondary | ICD-10-CM | POA: Diagnosis not present

## 2019-08-09 DIAGNOSIS — C61 Malignant neoplasm of prostate: Secondary | ICD-10-CM

## 2019-08-09 DIAGNOSIS — L97519 Non-pressure chronic ulcer of other part of right foot with unspecified severity: Secondary | ICD-10-CM | POA: Diagnosis not present

## 2019-08-09 DIAGNOSIS — I739 Peripheral vascular disease, unspecified: Secondary | ICD-10-CM | POA: Insufficient documentation

## 2019-08-09 DIAGNOSIS — I10 Essential (primary) hypertension: Secondary | ICD-10-CM | POA: Insufficient documentation

## 2019-08-09 DIAGNOSIS — C8581 Other specified types of non-Hodgkin lymphoma, lymph nodes of head, face, and neck: Secondary | ICD-10-CM | POA: Diagnosis not present

## 2019-08-09 DIAGNOSIS — M1990 Unspecified osteoarthritis, unspecified site: Secondary | ICD-10-CM | POA: Diagnosis not present

## 2019-08-09 DIAGNOSIS — Z79899 Other long term (current) drug therapy: Secondary | ICD-10-CM | POA: Diagnosis not present

## 2019-08-09 LAB — COMPREHENSIVE METABOLIC PANEL
ALT: 33 U/L (ref 0–44)
AST: 17 U/L (ref 15–41)
Albumin: 3.6 g/dL (ref 3.5–5.0)
Alkaline Phosphatase: 68 U/L (ref 38–126)
Anion gap: 8 (ref 5–15)
BUN: 26 mg/dL — ABNORMAL HIGH (ref 8–23)
CO2: 25 mmol/L (ref 22–32)
Calcium: 10.7 mg/dL — ABNORMAL HIGH (ref 8.9–10.3)
Chloride: 106 mmol/L (ref 98–111)
Creatinine, Ser: 1 mg/dL (ref 0.61–1.24)
GFR calc Af Amer: >60 mL/min (ref >60)
GFR calc non Af Amer: >60 mL/min (ref >60)
Glucose, Bld: 189 mg/dL — ABNORMAL HIGH (ref 70–99)
Potassium: 4.3 mmol/L (ref 3.5–5.1)
Sodium: 139 mmol/L (ref 135–145)
Total Bilirubin: 0.7 mg/dL (ref 0.3–1.2)
Total Protein: 6.5 g/dL (ref 6.5–8.1)

## 2019-08-09 LAB — CBC WITH DIFFERENTIAL/PLATELET
Abs Immature Granulocytes: 0.29 10*3/uL — ABNORMAL HIGH (ref 0.00–0.07)
Basophils Absolute: 0 10*3/uL (ref 0.0–0.1)
Basophils Relative: 0 %
Eosinophils Absolute: 0.1 10*3/uL (ref 0.0–0.5)
Eosinophils Relative: 1 %
HCT: 33.6 % — ABNORMAL LOW (ref 39.0–52.0)
Hemoglobin: 11.5 g/dL — ABNORMAL LOW (ref 13.0–17.0)
Immature Granulocytes: 3 %
Lymphocytes Relative: 28 %
Lymphs Abs: 2.4 10*3/uL (ref 0.7–4.0)
MCH: 32.8 pg (ref 26.0–34.0)
MCHC: 34.2 g/dL (ref 30.0–36.0)
MCV: 95.7 fL (ref 80.0–100.0)
Monocytes Absolute: 1.5 10*3/uL — ABNORMAL HIGH (ref 0.1–1.0)
Monocytes Relative: 17 %
Neutro Abs: 4.3 10*3/uL (ref 1.7–7.7)
Neutrophils Relative %: 51 %
Platelets: 175 10*3/uL (ref 150–400)
RBC: 3.51 MIL/uL — ABNORMAL LOW (ref 4.22–5.81)
RDW: 13.4 % (ref 11.5–15.5)
WBC: 8.6 10*3/uL (ref 4.0–10.5)
nRBC: 0 % (ref 0.0–0.2)

## 2019-08-09 LAB — LACTATE DEHYDROGENASE: LDH: 94 U/L — ABNORMAL LOW (ref 98–192)

## 2019-08-09 LAB — PSA: Prostatic Specific Antigen: <0.01 ng/mL (ref 0.00–4.00)

## 2019-08-09 NOTE — Assessment & Plan Note (Addendum)
1.  Metastatic prostate cancer to bones: -Abiraterone and prednisone started in October 2016. -Bone scan on 08/27/2017 shows interval improvement in the T12 lesion.  No new lesions. -Abiraterone dose reduced to 500 mg daily and prednisone 2.5 mg daily on 02/10/2018. -I have held his Abiraterone as he complained of pain in the abdomen and legs on 07/21/2019.  He reported no improvement in the pains.  He reports that they are his baseline pains. -Overall his energy levels are slightly better.  We will start him back on abiraterone 100 mg daily and prednisone 2.5 mg daily.  I have reviewed his labs.  His PSA is undetectable.  Last Lupron was 03/08/2019. -We will reevaluate him in 2 weeks.  2.  Hypercalcemia: -He received IV fluids for high calcium at last visit.  He also received denosumab. -His calcium today is 10.7.  We will closely monitor it in 2 weeks.  3.  IgM MGUS: -SPEP on 01/25/2019 shows M spike 0.4 g.  Light chain ratio is 0.91.  4.  Bone metastasis: -He is receiving denosumab every 4 weeks.  Last injection was on 07/18/2019.

## 2019-08-09 NOTE — Patient Instructions (Addendum)
[place] at [facility] Discharge Instructions  You were seen today by Dr. Delton Coombes. He went over your recent lab results. Restart the Zytiga 2 tablets a day as well as the prednisone 2.5mg  (1/2tablet) daily. He will see you back in 2 weeks for labs and follow up.   Thank you for choosing [place] at [facility] to provide your oncology and hematology care.  To afford each patient quality time with our provider, please arrive at least 15 minutes before your scheduled appointment time.   If you have a lab appointment with the [facility] please come in thru the  Main Entrance and check in at the main information desk  You need to re-schedule your appointment should you arrive 10 or more minutes late.  We strive to give you quality time with our providers, and arriving late affects you and other patients whose appointments are after yours.  Also, if you no show three or more times for appointments you may be dismissed from the clinic at the providers discretion.     Again, thank you for choosing [facility].  Our hope is that these requests will decrease the amount of time that you wait before being seen by our physicians.       _____________________________________________________________  Should you have questions after your visit to [facility], please contact our office at [phone] between the hours of 8:00 a.m. and 4:30 p.m.  Voicemails left after 4:00 p.m. will not be returned until the following business day.  For prescription refill requests, have your pharmacy contact our office and allow 72 hours.    Cancer Center Support Programs:   > Cancer Support Group  2nd Tuesday of the month 1pm-2pm, Journey Room

## 2019-08-09 NOTE — Progress Notes (Signed)
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  The Multi-Gene Panel offered by [facility] includes sequencing and/or deletion duplication testing of the following 80 genes: ALK, APC, ATM, AXIN2,BAP1,  BARD1, BLM, BMPR1A, BRCA1, BRCA2, BRIP1, CASR, CDC73, CDH1, CDK4, CDKN1B, CDKN1C, CDKN2A (p14ARF), CDKN2A (p16INK4a), CEBPA, CHEK2, CTNNA1, DICER1, DIS3L2, EGFR (c.2369C>T, p.Thr790Met variant only), EPCAM (Deletion/duplication testing only), FH, FLCN, GATA2, GPC3, GREM1 (Promoter region deletion/duplication testing only), HOXB13 (c.251G>A, p.Gly84Glu), HRAS, KIT, MAX, MEN1, MET, MITF (c.952G>A, p.Glu318Lys variant only), MLH1, MSH2, MSH3, MSH6, MUTYH, NBN, NF1, NF2, NTHL1, PALB2, PDGFRA, PHOX2B, PMS2, POLD1, POLE, POT1, PRKAR1A, PTCH1, PTEN, RAD50, RAD51C, RAD51D, RB1, RECQL4, RET, RUNX1, SDHAF2, SDHA (sequence changes only), SDHB, SDHC, SDHD, SMAD4, SMARCA4, SMARCB1, SMARCE1, STK11, SUFU, TERT, TERT, TMEM127, TP53, TSC1, TSC2, VHL, WRN and WT1.  The report date is February 08, 2017.    Prostate cancer ([facility])  09/08/2013 Initial Diagnosis   Prostate cancer ([facility])   02/08/2017 Genetic Testing   ATM Gain (Exons 62-63) VUS identified on the multi-gene panel.  The Multi-Gene Panel offered by [facility] includes sequencing and/or deletion duplication testing of the following 80 genes: ALK, APC, ATM, AXIN2,BAP1,  BARD1, BLM, BMPR1A, BRCA1, BRCA2, BRIP1, CASR, CDC73,  CDH1, CDK4, CDKN1B, CDKN1C, CDKN2A (p14ARF), CDKN2A (p16INK4a), CEBPA, CHEK2, CTNNA1, DICER1, DIS3L2, EGFR (c.2369C>T, p.Thr790Met variant only), EPCAM (Deletion/duplication testing only), FH, FLCN, GATA2, GPC3, GREM1 (Promoter region deletion/duplication testing only), HOXB13 (c.251G>A, p.Gly84Glu), HRAS, KIT, MAX, MEN1, MET, MITF (c.952G>A, p.Glu318Lys variant only), MLH1, MSH2, MSH3, MSH6, MUTYH, NBN, NF1, NF2, NTHL1, PALB2, PDGFRA, PHOX2B, PMS2, POLD1, POLE, POT1, PRKAR1A, PTCH1, PTEN, RAD50, RAD51C, RAD51D, RB1, RECQL4, RET, RUNX1, SDHAF2, SDHA (sequence changes only), SDHB, SDHC, SDHD, SMAD4, SMARCA4, SMARCB1, SMARCE1, STK11, SUFU, TERT, TERT, TMEM127, TP53, TSC1, TSC2, VHL, WRN and WT1.  The report date is February 08, 2017.       CANCER STAGING: Cancer Staging NHL (non-Hodgkin's lymphoma) (HCC) Staging form: Lymphoid Neoplasms, AJCC 6th Edition - Clinical: Stage III - Signed by Baird Cancer, PA on 08/18/2011    INTERVAL HISTORY:  [name] 86 y.o. male seen for follow-up of metastatic prostate cancer to the bones.  He has been off of abiraterone and prednisone for 2 weeks.  He did not see any major improvement in lower abdominal pain or leg pains.  Appetite is 25%.  Energy levels are 50%.  Has some trouble swallowing.  REVIEW OF SYSTEMS:  Review of Systems  HENT:   Positive for trouble swallowing.   All other systems reviewed and are negative.    PAST MEDICAL/SURGICAL HISTORY:  Past Medical History:  Diagnosis Date  . Acute bronchitis   . Arthritis   . Atrial fibrillation ([facility])    Dr. Harl Bowie- [facility] follows saw 11'14  . Bladder stones    tx. with oral meds and antibiotics, now surgery planned  . Cataracts, both eyes    surgery planned May 2015  . Diabetes mellitus without complication ([facility])    Type II  . Dyspnea    with activity  .  Dysrhythmia    Afib  . Family history of breast cancer   . Family history of colon cancer   . GERD (gastroesophageal reflux  disease)   . 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 Margot Chimes, thus far without recurrence.   . Hypertension   . NHL (non-Hodgkin's lymphoma) ([facility]) 2005   Diffuse large B-cell lymphoma, clinically stage IIIA, CD20 positive, status post cervical lymph node biopsy 08/03/2003 on the left. PET scan was also positive in the spleen and small bowel region, but bone marrow aspiration and biopsy were negative. So he essentially had stage IIIAs. He received R-CHOP x6 cycles with CR established by PET scan criteria on 11/23/2003 with no evidence for relapse th  . PAD (peripheral artery disease) ([facility]) 08/27/2017  . Skin cancer    Basal cell- face,head, neck,  arms, legs, Back   Past Surgical History:  Procedure Laterality Date  . AMPUTATION Left 06/29/2017   Procedure: AMPUTATION LEFT GREAT TOE;  Surgeon: [name], MD;  Location: [facility];  Service: Vascular;  Laterality: Left;  . AMPUTATION Left 08/27/2017   Procedure: AMPUTATION Left SECOND TOE;  Surgeon: [name], MD;  Location: [facility];  Service: Vascular;  Laterality: Left;  . CATARACT EXTRACTION Bilateral 2015  . CHOLECYSTECTOMY    . COLON RESECTION     small bowel  . CYSTOSCOPY WITH LITHOLAPAXY N/A 08/10/2013   Procedure: CYSTOSCOPY WITH LITHOLAPAXY WITH [name];  Surgeon: [name], MD;  [facility];  Service: Urology;  Laterality: N/A;  . ESOPHAGEAL DILATION N/A 02/27/2016   Procedure: ESOPHAGEAL DILATION;  Surgeon: [name], MD;  [facility];  Service: Endoscopy;  Laterality: N/A;  . ESOPHAGEAL DILATION N/A 07/07/2018   Procedure: ESOPHAGEAL DILATION;  Surgeon: [name], MD;  [facility];  Service: Endoscopy;  Laterality: N/A;  .  ESOPHAGOGASTRODUODENOSCOPY N/A 02/27/2016   Procedure: ESOPHAGOGASTRODUODENOSCOPY (EGD);  Surgeon: [name], MD;  [facility];  Service: Endoscopy;  Laterality: N/A;  12:00  . ESOPHAGOGASTRODUODENOSCOPY N/A 07/07/2018   Procedure: ESOPHAGOGASTRODUODENOSCOPY (EGD);  Surgeon: [name], MD;  [facility];  Service: Endoscopy;  Laterality: N/A;  . INCISION AND DRAINAGE PERIRECTAL ABSCESS    . LOWER EXTREMITY ANGIOGRAPHY N/A 06/25/2017   Procedure: LOWER EXTREMITY ANGIOGRAPHY;  Surgeon: [name], MD;  Location: [facility];  Service: Cardiovascular;  Laterality: N/A;  . LYMPH NODE DISSECTION Left    '05-neck  . PORT-A-CATH REMOVAL    . PORTACATH PLACEMENT     insertion and removal -last chemotherapy 10 yrs ago  . TRANSURETHRAL RESECTION OF PROSTATE N/A 08/10/2013   Procedure: TRANSURETHRAL RESECTION OF THE PROSTATE WITH GYRUS INSTRUMENTS;  Surgeon: [name], MD;  [facility];  Service: Urology;  Laterality: N/A;     SOCIAL HISTORY:  Social History   Socioeconomic History  . Marital status: Married    Spouse name: Not on file  . Number of children: Not on file  . Years of education: Not on file  . Highest education level: Not on file  Occupational History  . Occupation: retired    Fish farm manager: RETIRED    Comment: salesman  Tobacco Use  . Smoking status: Never Smoker  . Smokeless tobacco: Never Used  Substance and Sexual Activity  . Alcohol use: No  . Drug use: No  . Sexual activity: Not Currently  Other Topics Concern  . Not on file  Social History Narrative  . Not on file   Social Determinants of Health   Financial Resource Strain:   . Difficulty of Paying Living Expenses:   Food Insecurity:   . Worried About Charity fundraiser in the Last Year:   . Arboriculturist in the Last Year:   Transportation Needs:   . Film/video editor (Medical):   Marland Kitchen Lack of Transportation (Non-Medical):   Physical Activity:   .  Days of Exercise per Week:   . Minutes of Exercise per Session:   Stress:   . Feeling of Stress :   Social Connections:   . Frequency of Communication with Friends and Family:   . Frequency of Social Gatherings with Friends and Family:   . Attends Religious Services:   . Active Member of Clubs or Organizations:   . Attends Archivist Meetings:   Marland Kitchen Marital Status:   Intimate Partner Violence:   . Fear of Current or Ex-Partner:   . Emotionally Abused:   Marland Kitchen Physically Abused:   . Sexually Abused:     FAMILY HISTORY:  Family History  Problem Relation Age of Onset  . Heart attack Mother        died in her 61s  . Other Father 38       pneumonia - died  . Breast cancer Sister        dx in her 63s-60s  . Colon cancer Brother        dx in his 45s  . Cancer Sister        TAH/BSO due to "male cancer"  . Breast cancer Daughter 10  . Breast cancer Daughter 64       DCIS - ATM VUS on [facility] 83 gene panel in 2018  . Breast cancer Daughter 56       DCIS - Negative on [facility] MyRisk 25 gene apnel in 2015  . Thyroid cancer Daughter 82       Medullary and Papillary  . Thyroid nodules Grandchild     CURRENT MEDICATIONS:  Outpatient Encounter Medications as of 08/09/2019  Medication Sig  . apixaban (ELIQUIS) 5 MG TABS tablet Take 5 mg by mouth 2 (two) times daily.  . blood glucose meter kit and supplies Dispense based on patient and insurance preference. Use four times daily as directed. (FOR ICD-10 E10.9, E11.9).  . Calcium Polycarbophil (FIBER-CAPS PO) Take 1 capsule by mouth 2 (two) times daily.  . Cholecalciferol (VITAMIN D3) 1000 units CAPS Take 1,000 Units by mouth every morning.   . Cranberry-Vitamin C-Vitamin E (CRANBERRY PLUS VITAMIN C) 4200-20-3 MG-MG-UNIT CAPS Take 1 capsule by mouth every morning.  . diltiazem (CARDIZEM) 60 MG tablet Take 1 tablet by mouth twice daily  . donepezil (ARICEPT) 10 MG tablet Take 1 tablet by mouth once daily  . doxycycline (MONODOX) 100  MG capsule   . feeding supplement, ENSURE ENLIVE, (ENSURE ENLIVE) LIQD Take 237 mLs by mouth daily. CHOCOLATE   . furosemide (LASIX) 20 MG tablet Take 20 mg by mouth daily.   Marland Kitchen glipiZIDE (GLUCOTROL XL) 5 MG 24 hr tablet Take 5 mg by mouth daily with breakfast.   . glucose blood (EASYMAX TEST) test strip Use as instructed  . Lancets 28G MISC 1 kit by Does not apply route every morning.  Marland Kitchen  leuprolide (LUPRON) 11.25 MG KIT injection Inject into the muscle.  . lisinopril (ZESTRIL) 20 MG tablet Take 1 tablet by mouth once daily  . metFORMIN (GLUCOPHAGE) 850 MG tablet Take 850 mg by mouth 2 (two) times daily with a meal.   . mirtazapine (REMERON) 15 MG tablet Take 1 tablet (15 mg total) by mouth at bedtime.  . Multiple Vitamin (MULTIVITAMIN WITH MINERALS) TABS tablet Take 1 tablet by mouth every morning.  Marland Kitchen omeprazole (PRILOSEC) 40 MG capsule Take 40 mg by mouth every other day.   . Potassium Chloride (KLOR-CON 10 PO) Take 10 mg by mouth 2 (two) times daily.  . predniSONE (DELTASONE) 5 MG tablet Take 1/2 (one-half) tablet by mouth twice daily  . trimethoprim (TRIMPEX) 100 MG tablet Take 100 mg by mouth daily.   Marland Kitchen acetaminophen (TYLENOL) 500 MG tablet Take 500-1,000 mg by mouth every 8 (eight) hours as needed for mild pain, fever or headache.   . diphenhydrAMINE-APAP, sleep, (TYLENOL PM EXTRA STRENGTH PO) Take 1 tablet by mouth at bedtime.   . fluticasone (FLONASE) 50 MCG/ACT nasal spray Place 2 sprays into both nostrils daily.  . [DISCONTINUED] potassium chloride (MICRO-K) 10 MEQ CR capsule    No facility-administered encounter medications on file as of 08/09/2019.    ALLERGIES:  Allergies  Allergen Reactions  . Tape Other (See Comments)    SKIN IS VERY THIN AND WILL TEAR AND BRUISE VERY EASILY!!!!  . Augmentin [Amoxicillin-Pot Clavulanate] Rash and Other (See Comments)    Redness of skin     PHYSICAL EXAM:  ECOG Performance status: 1  Vitals:   08/09/19 1149  BP: 122/86  Pulse: 88    Resp: 17  Temp: (!) 96.4 F (35.8 C)  SpO2: 100%   Filed Weights   08/09/19 1149  Weight: 143 lb 1.6 oz (64.9 kg)    Physical Exam Constitutional:      Appearance: Normal appearance. He is normal weight.  Cardiovascular:     Rate and Rhythm: Normal rate and regular rhythm.     Heart sounds: Normal heart sounds.  Pulmonary:     Effort: Pulmonary effort is normal.     Breath sounds: Normal breath sounds.  Abdominal:     General: There is no distension.     Palpations: Abdomen is soft. There is no mass.  Musculoskeletal:        General: No swelling.  Skin:    General: Skin is warm.  Neurological:     Mental Status: He is alert and oriented to person, place, and time. Mental status is at baseline.  Psychiatric:        Mood and Affect: Mood normal.        Behavior: Behavior normal.      LABORATORY DATA:  I have reviewed the labs as listed.  CBC    Component Value Date/Time   WBC 8.6 08/09/2019 1100   RBC 3.51 (L) 08/09/2019 1100   HGB 11.5 (L) 08/09/2019 1100   HCT 33.6 (L) 08/09/2019 1100   PLT 175 08/09/2019 1100   MCV 95.7 08/09/2019 1100   MCH 32.8 08/09/2019 1100   MCHC 34.2 08/09/2019 1100   RDW 13.4 08/09/2019 1100   LYMPHSABS 2.4 08/09/2019 1100   MONOABS 1.5 (H) 08/09/2019 1100   EOSABS 0.1 08/09/2019 1100   BASOSABS 0.0 08/09/2019 1100   CMP Latest Ref Rng & Units 08/09/2019 07/18/2019 06/19/2019  Glucose 70 - 99 mg/dL 189(H) 202(H) 157(H)  BUN 8 - 23 mg/dL 26(H)  41(H) 30(H)  Creatinine 0.61 - 1.24 mg/dL 1.00 1.17 1.04  Sodium 135 - 145 mmol/L 139 142 140  Potassium 3.5 - 5.1 mmol/L 4.3 5.7(H) 5.2(H)  Chloride 98 - 111 mmol/L 106 113(H) 107  CO2 22 - 32 mmol/L _0 Calcium 8.9 - 10.3 mg/dL 10.7(H) 11.5(H) 10.8(H)  Total Protein 6.5 - 8.1 g/dL 6.5 6.6 7.2  Total Bilirubin 0.3 - 1.2 mg/dL 0.7 0.8 0.5  Alkaline Phos 38 - 126 U/L 68 58 80  AST 15 - 41 U/L 17 15 14(L)  ALT 0 - 44 U/L 33 18 15       DIAGNOSTIC IMAGING:  I have reviewed  scans.     ASSESSMENT & PLAN:   Prostate cancer ([facility]) 1.  Metastatic prostate cancer to bones: -Abiraterone and prednisone started in October 2016. -Bone scan on 08/27/2017 shows interval improvement in the T12 lesion.  No new lesions. -Abiraterone dose reduced to 500 mg daily and prednisone 2.5 mg daily on 02/10/2018. -I have held his Abiraterone as he complained of pain in the abdomen and legs on 07/21/2019.  He reported no improvement in the pains.  He reports that they are his baseline pains. -Overall his energy levels are slightly better.  We will start him back on abiraterone 100 mg daily and prednisone 2.5 mg daily.  I have reviewed his labs.  His PSA is undetectable.  Last Lupron was 03/08/2019. -We will reevaluate him in 2 weeks.  2.  Hypercalcemia: -He received IV fluids for high calcium at last visit.  He also received denosumab. -His calcium today is 10.7.  We will closely monitor it in 2 weeks.  3.  IgM MGUS: -SPEP on 01/25/2019 shows M spike 0.4 g.  Light chain ratio is 0.91.  4.  Bone metastasis: -He is receiving denosumab every 4 weeks.  Last injection was on 07/18/2019.     Orders placed this encounter:  No orders of the defined types were placed in this encounter.     [name], MD [place] [phone]

## 2019-08-10 ENCOUNTER — Encounter (HOSPITAL_COMMUNITY): Payer: Self-pay | Admitting: Surgery

## 2019-08-10 ENCOUNTER — Telehealth (HOSPITAL_COMMUNITY): Payer: Self-pay | Admitting: Surgery

## 2019-08-10 DIAGNOSIS — E1165 Type 2 diabetes mellitus with hyperglycemia: Secondary | ICD-10-CM | POA: Diagnosis not present

## 2019-08-10 DIAGNOSIS — I10 Essential (primary) hypertension: Secondary | ICD-10-CM | POA: Diagnosis not present

## 2019-08-10 DIAGNOSIS — L97519 Non-pressure chronic ulcer of other part of right foot with unspecified severity: Secondary | ICD-10-CM | POA: Diagnosis not present

## 2019-08-10 DIAGNOSIS — C61 Malignant neoplasm of prostate: Secondary | ICD-10-CM | POA: Diagnosis not present

## 2019-08-10 DIAGNOSIS — R269 Unspecified abnormalities of gait and mobility: Secondary | ICD-10-CM | POA: Diagnosis not present

## 2019-08-10 DIAGNOSIS — C8581 Other specified types of non-Hodgkin lymphoma, lymph nodes of head, face, and neck: Secondary | ICD-10-CM | POA: Diagnosis not present

## 2019-08-10 NOTE — Telephone Encounter (Signed)
Pt's daughter had left a voicemail asking for clarification on her father's medications after his visit yesterday.  Per Dr. Delton Coombes, the pt is to continue holding his potassium and calcium, and the pt is to restart his zytiga 500 mg daily and continue prednisone 2.5 mg daily.  A mychart message was sent with this information at the request of the pt's daughter.

## 2019-08-11 DIAGNOSIS — I10 Essential (primary) hypertension: Secondary | ICD-10-CM | POA: Diagnosis not present

## 2019-08-11 DIAGNOSIS — C61 Malignant neoplasm of prostate: Secondary | ICD-10-CM | POA: Diagnosis not present

## 2019-08-11 DIAGNOSIS — L97519 Non-pressure chronic ulcer of other part of right foot with unspecified severity: Secondary | ICD-10-CM | POA: Diagnosis not present

## 2019-08-11 DIAGNOSIS — C8581 Other specified types of non-Hodgkin lymphoma, lymph nodes of head, face, and neck: Secondary | ICD-10-CM | POA: Diagnosis not present

## 2019-08-11 DIAGNOSIS — E1165 Type 2 diabetes mellitus with hyperglycemia: Secondary | ICD-10-CM | POA: Diagnosis not present

## 2019-08-11 DIAGNOSIS — R269 Unspecified abnormalities of gait and mobility: Secondary | ICD-10-CM | POA: Diagnosis not present

## 2019-08-14 ENCOUNTER — Encounter (HOSPITAL_BASED_OUTPATIENT_CLINIC_OR_DEPARTMENT_OTHER): Payer: Medicare Other | Admitting: Internal Medicine

## 2019-08-14 ENCOUNTER — Other Ambulatory Visit: Payer: Self-pay

## 2019-08-14 DIAGNOSIS — L97512 Non-pressure chronic ulcer of other part of right foot with fat layer exposed: Secondary | ICD-10-CM | POA: Diagnosis not present

## 2019-08-14 DIAGNOSIS — I4891 Unspecified atrial fibrillation: Secondary | ICD-10-CM | POA: Diagnosis not present

## 2019-08-14 DIAGNOSIS — E1140 Type 2 diabetes mellitus with diabetic neuropathy, unspecified: Secondary | ICD-10-CM | POA: Diagnosis not present

## 2019-08-14 DIAGNOSIS — I10 Essential (primary) hypertension: Secondary | ICD-10-CM | POA: Diagnosis not present

## 2019-08-14 DIAGNOSIS — E1142 Type 2 diabetes mellitus with diabetic polyneuropathy: Secondary | ICD-10-CM | POA: Diagnosis not present

## 2019-08-14 DIAGNOSIS — E1151 Type 2 diabetes mellitus with diabetic peripheral angiopathy without gangrene: Secondary | ICD-10-CM | POA: Diagnosis not present

## 2019-08-14 DIAGNOSIS — L97514 Non-pressure chronic ulcer of other part of right foot with necrosis of bone: Secondary | ICD-10-CM | POA: Diagnosis not present

## 2019-08-14 DIAGNOSIS — E11621 Type 2 diabetes mellitus with foot ulcer: Secondary | ICD-10-CM | POA: Diagnosis not present

## 2019-08-14 NOTE — Progress Notes (Signed)
[name], [name] ([id number]) Visit Report for 08/14/2019 Debridement Details Patient Name: Date of Service: [name], [name] 08/14/2019 3:15 PM Medical Record [id number] Patient Account Number: [id number] Date of Birth/Sex: Treating RN: 03/21/1924 (86 y.o. [name] Primary Care Provider: Benny Lennert Other Clinician: Referring Provider: Treating Provider/Extender:Knoxx Boeding, Shayne Alken, LISA Weeks in Treatment: 2 Debridement Performed for Wound #2 Right Toe Great Assessment: Performed By: Physician Ricard Dillon., MD Debridement Type: Debridement Severity of Tissue Pre Fat layer exposed Debridement: Level of Consciousness (Pre- Awake and Alert procedure): Pre-procedure Verification/Time Out Taken: Yes - 16:15 Start Time: 16:15 Total Area Debrided (L x W): 0.2 (cm) x 0.4 (cm) = 0.08 (cm) Tissue and other material Viable, Non-Viable, Bone, Subcutaneous debrided: Level: Skin/Subcutaneous Tissue/Muscle/Bone Debridement Description: Excisional Instrument: Curette Bleeding: Minimum Hemostasis Achieved: Pressure End Time: 16:16 Procedural Pain: 0 Post Procedural Pain: 0 Response to Treatment: Procedure was tolerated well Level of Consciousness Awake and Alert (Post-procedure): Post Debridement Measurements of Total Wound Length: (cm) 0.2 Width: (cm) 0.4 Depth: (cm) 0.1 Volume: (cm) 0.006 Character of Wound/Ulcer Post Improved Debridement: Severity of Tissue Post Debridement: Fat layer exposed Post Procedure Diagnosis Same as Pre-procedure Electronic Signature(s) Signed: 08/14/2019 5:33:33 PM By: Levan Hurst RN, BSN Signed: 08/14/2019 5:40:04 PM By: Linton Ham MD Entered By: Linton Ham on 08/14/2019 17:11:26 -------------------------------------------------------------------------------- HPI Details Patient Name: Date of Service: [name] 08/14/2019 3:15 PM Medical Record [id number] Patient Account Number: [id number] Date of  Birth/Sex: Treating RN: June 12, 1923 (86 y.o. [name] Primary Care Provider: Benny Lennert Other Clinician: Referring Provider: Treating Provider/Extender:Hester Forget, Shayne Alken, LISA Weeks in Treatment: 2 History of Present Illness HPI Description: ADMISSION 05/02/2019 This is a 86 year old man accompanied by his daughter. He lives in [place] with his elderly wife. He has home caregivers as well. Apparently on February 28, 2019 they noted blood on his sock and noticed a wound on the tip of his right great toe. He has been cared for by Dr. Posey Pronto who is his podiatrist in [place]. Apparently they have been using Betadine to this area. The patient has a thick mycotic nail on the right as well which is embedded in the wound in some areas. Fortunately the patient has diabetic neuropathy and is not really experiencing any pain otherwise I think this would all be quite painful. The patient was previously treated in 2019 for wounds on the left foot. This included a fairly thorough vascular review by vein and vascular Dr. Oneida Alar. His ABIs at that time were noncompressible bilaterally TBI on the right was 0.88 with biphasic waveforms on the right and monophasic waveforms at the left. He ended up having amputations of the left first and second toes. He did have an angiogram on 06/25/2017 which showed on the right the right common femoral profundofemoral for Morris and superficial femoral arteries were widely patent the right popliteal was widely patent. Tibial vessels were not well opacified secondary to motion artifact however he was felt to have all 3 tibial vessels intact with some calcification and some areas of stenosis within the posterior tibial artery but relatively good flow to the right lower leg. He also had the left reviewed as well which was the source of the problem at that time. Past medical history; prostate cancer on oral antiandrogen therapy with bone mets, GI  stromal tumor in 2005, type 2 diabetes with peripheral neuropathy, hypertension, atrial fibrillation on Eliquis, history of non-Hodgkin's lymphoma treated with CHOP in 2005. He had amputations of the left  first and second toes secondary to osteomyelitis. Does not appear that he has had an x-ray of the right foot ABI in our clinic on the right was noncompressible [see full arterial discussion above]. 1/19; area on the tip of the right great toe under a very mycotic nail tip. We use silver alginate. 1/26; area of the tip of the right great toe under a very mycotic nail. A lot of this seems to have closed down we have been using silver alginate 2/9; the areas on the tip of the right great toe under a very mycotic nail. A lot of this continues to close down. I do not think there is a subungual problem. I am hopeful that this mycotic nail which is thick and raised will allow full epithelialization of the wound 2/23; this was a difficult area on the tip of his right great toe. Thick mycotic nail over the base of the wound. This appears to have closed down now. The nail itself is thick split with considerable subungual debris. READMISSION 07/25/2019 This is a 86 year old man that we cared for earlier this year in the clinic without a wound on the tip of his right great toe under a very mycotic toenail. He is a type II diabetic. His ABIs have been noncompressible however he has been felt to have adequate blood flow in the right to heal the wound. We were able to get this wound to close over as I understand things he followed with Dr. Caprice Beaver had some debridement of the nail everything looks fine and apparently the wound bed was washed off by his wife over the weekend and by Monday it was open. His daughter is able to show me pictures. Unfortunately this now has exposed bone raising the possibility that he has had underlying osteomyelitis all along. He has been using Silvadene cream on this he has been  referred back to the clinic by Dr. Caprice Beaver. He had x-rays of the right foot that did not show evidence of osteomyelitis in the right foot. He has not been on antibiotics He also had an x-ray of the left foot that showed findings suspicious for osteomyelitis involving the residual base of the first proximal phalanx and the proximal phalanx of the left second toe as well as the left second metatarsal head however he has no wounds in this area. 4/12; culture from last time of bone in the right great toe was negative. The patient's wife apparently scrubbed this toe vigorously and according to his daughter reopen the wound however unfortunately it is all exposed bone last time. I removed some of this and we use Prisma unfortunately the area does not look much better today. Previous x-rays done in podiatry office Dr. Caprice Beaver did not show evidence of osteomyelitis. Nevertheless I think osteomyelitis here is probably quite likely. Still using Prisma 4/26; he took 2 weeks of doxycycline although his daughter is telling me he is not eating. He has loose bowel movements however I did not get the sense that this is changed all that much. He still has exposed bone. Culture I did last time showed a few coag negative staph I do not think that this is necessarily relevant. I still am operating under the premise that the patient probably has osteomyelitis. After considerable difficulty we are able to determine he is not allergic to cephalosporins I therefore gave him Keflex. Electronic Signature(s) Signed: 08/14/2019 5:40:04 PM By: Linton Ham MD Entered By: Linton Ham on 08/14/2019 17:12:56 -------------------------------------------------------------------------------- Physical Exam Details  Patient Name: Date of Service: [name], [name] 08/14/2019 3:15 PM Medical Record [id number] Patient Account Number: [id number] Date of Birth/Sex: Treating RN: February 13, 1924 (86 y.o. [name] Primary Care Provider: Benny Lennert Other Clinician: Referring Provider: Treating Provider/Extender:Charmian Forbis, Shayne Alken, LISA Weeks in Treatment: 2 Constitutional Sitting or standing Blood Pressure is within target range for patient.. Pulse regular and within target range for patient.Marland Kitchen Respirations regular, non-labored and within target range.. Temperature is normal and within the target range for the patient.Marland Kitchen Appears in no distress. Notes Wound exam; difficult wound on the tip of the right first toe. Again exposed bone without much granulation. I attempted to remove as much of this as possible using a #3 curette this is to try and allow for some room for progressive granulation however I do not know how far I got with this. There is no obvious surrounding soft tissue infection Electronic Signature(s) Signed: 08/14/2019 5:40:04 PM By: Linton Ham MD Entered By: Linton Ham on 08/14/2019 17:13:57 -------------------------------------------------------------------------------- Physician Orders Details Patient Name: Date of Service: [name], [name] 08/14/2019 3:15 PM Medical Record [id number] Patient Account Number: [id number] Date of Birth/Sex: Treating RN: 04-10-1924 (86 y.o. [name] Primary Care Provider: Benny Lennert Other Clinician: Referring Provider: Treating Provider/Extender:Braheem Tomasik, Shayne Alken, LISA Weeks in Treatment: 2 Verbal / Phone Orders: No Diagnosis Coding ICD-10 Coding Code Description E11.621 Type 2 diabetes mellitus with foot ulcer L97.514 Non-pressure chronic ulcer of other part of right foot with necrosis of bone E11.40 Type 2 diabetes mellitus with diabetic neuropathy, unspecified Follow-up Appointments Return Appointment in 2 weeks. Dressing Change Frequency Wound #2 Right Toe Great Change dressing three times week. - home health to change 2x/week on week that patient is seen at wound clinic Wound Cleansing Wound #2  Right Toe Great May shower and wash wound with soap and water. - on days that dressing is changed Primary Wound Dressing Wound #2 Right Toe Great Silver Collagen - moisten with hydrogel or KY jelly Secondary Dressing Wound #2 Right Toe Great Kerlix/Rolled Gauze Dry [facility] skilled nursing for wound care. - [facility] Patient Medications Allergies: Augmentin Notifications Medication Indication Start End cephalexin 08/14/2019 DOSE oral 500 mg capsule - 1 capsule oral tid for 2 weeks Electronic Signature(s) Signed: 08/14/2019 4:41:38 PM By: Linton Ham MD Entered By: Linton Ham on 08/14/2019 16:41:37 -------------------------------------------------------------------------------- Problem List Details Patient Name: Date of Service: [name] 08/14/2019 3:15 PM Medical Record [id number] Patient Account Number: [id number] Date of Birth/Sex: Treating RN: March 19, 1924 (86 y.o. [name] Primary Care Provider: Benny Lennert Other Clinician: Referring Provider: Treating Provider/Extender:Roark Rufo, Shayne Alken, LISA Weeks in Treatment: 2 Active Problems ICD-10 Encounter Code Description Active Date MDM Diagnosis E11.621 Type 2 diabetes mellitus with foot ulcer 07/25/2019 No Yes L97.514 Non-pressure chronic ulcer of other part of right foot with 07/25/2019 No Yes necrosis of bone E11.40 Type 2 diabetes mellitus with diabetic neuropathy, 07/25/2019 No Yes unspecified Inactive Problems Resolved Problems Electronic Signature(s) Signed: 08/14/2019 5:40:04 PM By: Linton Ham MD Entered By: Linton Ham on 08/14/2019 17:08:47 -------------------------------------------------------------------------------- Progress Note Details Patient Name: Date of Service: [name] 08/14/2019 3:15 PM Medical Record [id number] Patient Account Number: [id number] Date of Birth/Sex: Treating RN: [birth year] (86 y.o. [name] Primary Care Provider: Benny Lennert Other Clinician: Referring Provider: Treating Provider/Extender:Tavi Hoogendoorn, Shayne Alken, LISA Weeks in Treatment: 2 Subjective History of Present Illness (HPI) ADMISSION 05/02/2019 This is a 86 year old man accompanied by his daughter. He lives in [place] with his elderly wife. He has home caregivers as well. Apparently on February 28, 2019 they noted blood on his sock and noticed a wound on the tip of his right great toe. He has been cared for by Dr. Posey Pronto who is his podiatrist in [place]. Apparently they have been using Betadine to this area. The patient has a thick mycotic nail on the right as well which is embedded in the wound in some areas. Fortunately the patient has diabetic neuropathy and is not really experiencing any pain otherwise I think this would all be quite painful. The patient was previously treated in 2019 for wounds on the left foot. This included a fairly thorough vascular review by vein and vascular Dr. Oneida Alar. His ABIs at that time were noncompressible bilaterally TBI on the right was 0.88 with biphasic waveforms on the right and monophasic waveforms at the left. He ended up having amputations of the left first and second toes. He did have an angiogram on 06/25/2017 which showed on the right the right common femoral profundofemoral for Morris and superficial femoral arteries were widely patent the right popliteal was widely patent. Tibial vessels were not well opacified secondary to motion artifact however he was felt to have all 3 tibial vessels intact with some calcification and some areas of stenosis within the posterior tibial artery but relatively good flow to the right lower leg. He also had the left reviewed as well which was the source of the problem at that time. Past medical history; prostate cancer on oral antiandrogen therapy with bone mets, GI stromal tumor in 2005, type 2 diabetes with peripheral  neuropathy, hypertension, atrial fibrillation on Eliquis, history of non-Hodgkin's lymphoma treated with CHOP in 2005. He had amputations of the left first and second toes secondary to osteomyelitis. Does not appear that he has had an x-ray of the right foot ABI in our clinic on the right was noncompressible [see full arterial discussion above]. 1/19; area on the tip of the right great toe under a very mycotic nail tip. We use silver alginate. 1/26; area of the tip of the right great toe under a very mycotic nail. A lot of this seems to have closed down we have been using silver alginate 2/9; the areas on the tip of the right great toe under a very mycotic nail. A lot of this continues to close down. I do not think there is a subungual problem. I am hopeful that this mycotic nail which is thick and raised will allow full epithelialization of the wound 2/23; this was a difficult area on the tip of his right great toe. Thick mycotic nail over the base of the wound. This appears to have closed down now. The nail itself is thick split with considerable subungual debris. READMISSION 07/25/2019 This is a 86 year old man that we cared for earlier this year in the clinic without a wound on the tip of his right great toe under a very mycotic toenail. He is a type II diabetic. His ABIs have been noncompressible however he has been felt to have adequate blood flow in the right to heal the wound. We were able to get this wound to close over as I understand things he followed with Dr. Caprice Beaver had some debridement of the nail everything looks fine and apparently the wound bed was washed off by his wife over the weekend and by Monday it was open. His daughter is able to show me pictures. Unfortunately this now has exposed bone  raising the possibility that he has had underlying osteomyelitis all along. He has been using Silvadene cream on this he has been referred back to the clinic by Dr. Caprice Beaver. He had  x-rays of the right foot that did not show evidence of osteomyelitis in the right foot. He has not been on antibiotics He also had an x-ray of the left foot that showed findings suspicious for osteomyelitis involving the residual base of the first proximal phalanx and the proximal phalanx of the left second toe as well as the left second metatarsal head however he has no wounds in this area. 4/12; culture from last time of bone in the right great toe was negative. The patient's wife apparently scrubbed this toe vigorously and according to his daughter reopen the wound however unfortunately it is all exposed bone last time. I removed some of this and we use Prisma unfortunately the area does not look much better today. Previous x-rays done in podiatry office Dr. Caprice Beaver did not show evidence of osteomyelitis. Nevertheless I think osteomyelitis here is probably quite likely. Still using Prisma 4/26; he took 2 weeks of doxycycline although his daughter is telling me he is not eating. He has loose bowel movements however I did not get the sense that this is changed all that much. He still has exposed bone. Culture I did last time showed a few coag negative staph I do not think that this is necessarily relevant. I still am operating under the premise that the patient probably has osteomyelitis. After considerable difficulty we are able to determine he is not allergic to cephalosporins I therefore gave him Keflex. Objective Constitutional Sitting or standing Blood Pressure is within target range for patient.. Pulse regular and within target range for patient.Marland Kitchen Respirations regular, non-labored and within target range.. Temperature is normal and within the target range for the patient.Marland Kitchen Appears in no distress. Vitals Time Taken: 3:35 PM, Height: 70 in, Weight: 140 lbs, BMI: 20.1, Temperature: 97.6 F, Pulse: 63 bpm, Respiratory Rate: 20 breaths/min, Blood Pressure: 112/62 mmHg. General Notes: Wound  exam; difficult wound on the tip of the right first toe. Again exposed bone without much granulation. I attempted to remove as much of this as possible using a #3 curette this is to try and allow for some room for progressive granulation however I do not know how far I got with this. There is no obvious surrounding soft tissue infection Integumentary (Hair, Skin) Wound #2 status is Open. Original cause of wound was Gradually Appeared. The wound is located on the Right Toe Great. The wound measures 0.2cm length x 0.4cm width x 0.1cm depth; 0.063cm^2 area and 0.006cm^3 volume. There is bone and Fat Layer (Subcutaneous Tissue) Exposed exposed. There is no tunneling or undermining noted. There is a none present amount of drainage noted. The wound margin is distinct with the outline attached to the wound base. There is no granulation within the wound bed. There is no necrotic tissue within the wound bed. Assessment Active Problems ICD-10 Type 2 diabetes mellitus with foot ulcer Non-pressure chronic ulcer of other part of right foot with necrosis of bone Type 2 diabetes mellitus with diabetic neuropathy, unspecified Procedures Wound #2 Pre-procedure diagnosis of Wound #2 is a Diabetic Wound/Ulcer of the Lower Extremity located on the Right Toe Great .Severity of Tissue Pre Debridement is: Fat layer exposed. There was a Excisional Skin/Subcutaneous Tissue/Muscle/Bone Debridement with a total area of 0.08 sq cm performed by Ricard Dillon., MD. With the following instrument(s):  Curette to remove Viable and Non-Viable tissue/material. Material removed includes Bone,Subcutaneous Tissue and. No specimens were taken. A time out was conducted at 16:15, prior to the start of the procedure. A Minimum amount of bleeding was controlled with Pressure. The procedure was tolerated well with a pain level of 0 throughout and a pain level of 0 following the procedure. Post Debridement Measurements: 0.2cm length  x 0.4cm width x 0.1cm depth; 0.006cm^3 volume. Character of Wound/Ulcer Post Debridement is improved. Severity of Tissue Post Debridement is: Fat layer exposed. Post procedure Diagnosis Wound #2: Same as Pre-Procedure Plan Follow-up Appointments: Return Appointment in 2 weeks. Dressing Change Frequency: Wound #2 Right Toe Great: Change dressing three times week. - home health to change 2x/week on week that patient is seen at wound clinic Wound Cleansing: Wound #2 Right Toe Great: May shower and wash wound with soap and water. - on days that dressing is changed Primary Wound Dressing: Wound #2 Right Toe Great: Silver Collagen - moisten with hydrogel or KY jelly Secondary Dressing: Wound #2 Right Toe Great: Kerlix/Rolled Gauze Dry Gauze Home Health: [place] skilled nursing for wound care. - [facility] The following medication(s) was prescribed: cephalexin oral 500 mg capsule 1 capsule oral tid for 2 weeks starting 08/14/2019 1. Continue with the silver collagen small wound area and attempt to promote some granulation 2. Cephalexin 500 times a day for 2 weeks 3. I wonder about whether were going to be able to heal this area and whether a surgical approach i.e. partial amputation might be in order. 4. The patient is very frail not eating I have been trying to avoid any more aggressive tests such as MRIs etc. Electronic Signature(s) Signed: 08/14/2019 5:40:04 PM By: Linton Ham MD Entered By: Linton Ham on 08/14/2019 17:17:50 -------------------------------------------------------------------------------- SuperBill Details Patient Name: Date of Service: [name] 08/14/2019 Medical Record [id number] Patient Account Number: [id number] Date of Birth/Sex: Treating RN: 23-Feb-1924 (86 y.o. [name] Primary Care Provider: Benny Lennert Other Clinician: Referring Provider: Treating Provider/Extender:Elyanah Farino, Shayne Alken, LISA Weeks in Treatment:  2 Diagnosis Coding ICD-10 Codes Code Description E11.621 Type 2 diabetes mellitus with foot ulcer L97.514 Non-pressure chronic ulcer of other part of right foot with necrosis of bone E11.40 Type 2 diabetes mellitus with diabetic neuropathy, unspecified Facility Procedures CPT4 Code: [id number] Description: [id number] - DEB BONE 20 SQ CM/< ICD-10 Diagnosis Description L97.514 Non-pressure chronic ulcer of other part of right foot wi E11.621 Type 2 diabetes mellitus with foot ulcer Modifier: th necrosis of bo Quantity: 1 ne Physician Procedures CPT4: Code Description Modifier Quantity [id number] Debridement; bone (includes epidermis, dermis, subQ tissue, muscle and/or fascia, if 1 performed) 1st 20 sqcm or less ICD-10 Diagnosis Description L97.514 Non-pressure chronic ulcer of other part of right  foot with necrosis of bone E11.621 Type 2 diabetes mellitus with foot ulcer Electronic Signature(s) Signed: 08/14/2019 5:40:04 PM By: Linton Ham MD Entered By: Linton Ham on 08/14/2019 17:18:09

## 2019-08-14 NOTE — Progress Notes (Signed)
TYE, BRENNEMAN ([id number]) Visit Report for 08/14/2019 Fall Risk Assessment Details Patient Name: Date of Service: [name], [name] 08/14/2019 3:15 PM Medical Record [id number] Patient Account Number: [id number] Date of Birth/Sex: Treating RN: 1924-03-21 (86 y.o. [name] Primary Care Nikodem Leadbetter: Benny Lennert Other Clinician: Referring Blair Mesina: Treating Wiatt Mahabir/Extender:Robson, Shayne Alken, LISA Weeks in Treatment: 2 Fall Risk Assessment Items Have you had 2 or more falls in the last 12 monthso 0 No Have you had any fall that resulted in injury in the last 12 monthso 0 No FALLS RISK SCREEN History of falling - immediate or within 3 months 25 Yes Secondary diagnosis (Do you have 2 or more medical diagnoseso) 0 No Ambulatory aid None/bed rest/wheelchair/nurse 0 Yes Crutches/cane/walker 0 No Furniture 0 No Intravenous therapy Access/Saline/Heparin Lock 0 No Weak (short steps with or without shuffle, stooped but able to lift head 10 Yes while walking, may seek support from furniture) Impaired (short steps with shuffle, may have difficulty arising from chair, 0 No head down, impaired balance) Mental Status Oriented to own ability 0 No Overestimates or forgets limitations 15 Yes Risk Level: Medium Risk Score: 50 Electronic Signature(s) Signed: 08/14/2019 5:14:33 PM By: Deon Pilling Signed: 08/14/2019 5:33:33 PM By: Levan Hurst RN, BSN Entered By: Deon Pilling on 08/14/2019 15:39:46

## 2019-08-15 ENCOUNTER — Ambulatory Visit (HOSPITAL_COMMUNITY): Payer: Medicare Other

## 2019-08-15 ENCOUNTER — Other Ambulatory Visit (HOSPITAL_COMMUNITY): Payer: Medicare Other

## 2019-08-15 NOTE — Progress Notes (Signed)
[name], [name] ([id number]) Visit Report for 08/14/2019 Arrival Information Details Patient Name: Date of Service: [name], [name] 08/14/2019 3:15 PM Medical Record [id number] Patient Account Number: [id number] Date of Birth/Sex: Treating RN: 1923/04/22 (86 y.o. [name] Primary Care Chardai Gangemi: Benny Lennert Other Clinician: Referring Slayden Mennenga: Treating Chase Arnall/Extender:Robson, Shayne Alken, LISA Weeks in Treatment: 2 Visit Information History Since Last Visit Added or deleted any medications: No Patient Arrived: Wheel Chair Any new allergies or adverse reactions: No Arrival Time: 15:37 Had a fall or experienced change in No Accompanied By: daughter activities of daily living that may affect Transfer Assistance: None risk of falls: Patient Identification Verified: Yes Signs or symptoms of abuse/neglect since last visito No Secondary Verification Process Yes Hospitalized since last visit: No Completed: Implantable device outside of the clinic excluding No Patient Requires Transmission-Based No cellular tissue based products placed in the center Precautions: since last visit: Patient Has Alerts: Yes Has Dressing in Place as Prescribed: Yes Patient Alerts: R ABI non Pain Present Now: No compressible Electronic Signature(s) Signed: 08/14/2019 5:14:33 PM By: Deon Pilling Entered By: Deon Pilling on 08/14/2019 15:37:46 -------------------------------------------------------------------------------- Encounter Discharge Information Details Patient Name: Date of Service: [name] 08/14/2019 3:15 PM Medical Record [id number] Patient Account Number: [id number] Date of Birth/Sex: Treating RN: 12/12/1923 (86 y.o. [name] Primary Care Paco Cislo: Benny Lennert Other Clinician: Referring Bre Pecina: Treating Melesio Madara/Extender:Robson, Shayne Alken, LISA Weeks in Treatment: 2 Encounter Discharge Information Items Post Procedure Vitals Discharge  Condition: Stable Temperature (F): 97.6 Ambulatory Status: Wheelchair Pulse (bpm): 63 Discharge Destination: Home Respiratory Rate (breaths/min): 20 Transportation: Private Auto Blood Pressure (mmHg): 112/62 Accompanied By: daughter Schedule Follow-up Appointment: Yes Clinical Summary of Care: Patient Declined Electronic Signature(s) Signed: 08/15/2019 5:26:58 PM By: [name] Entered By: [name] on 08/14/2019 16:27:07 -------------------------------------------------------------------------------- Lower Extremity Assessment Details Patient Name: Date of Service: [name], [name] 08/14/2019 3:15 PM Medical Record [id number] Patient Account Number: [id number] Date of Birth/Sex: Treating RN: [birth year] (86 y.o. [name] Primary Care Kanae Ignatowski: Benny Lennert Other Clinician: Referring Lyn Deemer: Treating Konstantin Lehnen/Extender:Robson, Shayne Alken, LISA Weeks in Treatment: 2 Edema Assessment Assessed: [Left: No] [Right: Yes] Edema: [Left: N] [Right: o] Calf Left: Right: Point of Measurement: cm From Medial Instep cm 28 cm Ankle Left: Right: Point of Measurement: cm From Medial Instep cm 21 cm Vascular Assessment Pulses: Dorsalis Pedis Palpable: [Right:Yes] Electronic Signature(s) Signed: 08/14/2019 5:14:33 PM By: Deon Pilling Signed: 08/14/2019 5:33:33 PM By: Levan Hurst RN, BSN Entered By: Deon Pilling on 08/14/2019 15:43:26 -------------------------------------------------------------------------------- Multi Wound Chart Details Patient Name: Date of Service: [name] 08/14/2019 3:15 PM Medical Record [id number] Patient Account Number: [id number] Date of Birth/Sex: Treating RN: 1924-03-05 (86 y.o. 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] Comorbid History: [2:Cataracts, Arrhythmia, Hypertension, Peripheral Arterial Disease, Type II Diabetes, Osteomyelitis, Dementia, Neuropathy, Received Chemotherapy] [N/A:N/A] Date Acquired: [2:06/15/2019] [N/A:N/A] Weeks of Treatment: [2:2] [N/A:N/A] Wound Status: [2:Open] [N/A:N/A] Measurements L x W x D 0.2x0.4x0.1 [N/A:N/A] (cm) Area (cm) : [2:0.063] [N/A:N/A] Volume (cm) : [2:0.006] [N/A:N/A] % Reduction in Area: [2:33.00%] [N/A:N/A] % Reduction in Volume: 78.60% [N/A:N/A] Classification: [2:Grade 2] [N/A:N/A] Exudate Amount: [2:None Present] [N/A:N/A] Wound Margin: [2:Distinct, outline attached  N/A] Granulation Amount: [2:None Present (0%)] [N/A:N/A] Necrotic Amount: [2:None Present (0%)] [N/A:N/A] Exposed Structures: [2:Fat Layer (Subcutaneous N/A Tissue) Exposed: Yes Bone: Yes Fascia: No Tendon: No Muscle: No Joint: No] Epithelialization: [2:None] [N/A:N/A] Debridement: [2:Debridement - Excisional N/A] Pre-procedure [2:16:15] [N/A:N/A] Verification/Time Out Taken: Tissue Debrided: [2:Bone, Subcutaneous] [N/A:N/A] Level: [2:Skin/Subcutaneous Tissue/Muscle/Bone] [N/A:N/A] Debridement Area (sq cm):0.08 [N/A:N/A] Instrument: [2:Curette] [N/A:N/A] Bleeding: [2:Minimum] [N/A:N/A] Hemostasis Achieved: [2:Pressure] [N/A:N/A] Procedural Pain: [2:0] [N/A:N/A] Post Procedural Pain: [2:0] [N/A:N/A] Debridement Treatment Procedure was tolerated [N/A:N/A] Response: [2:well] Post Debridement [2:0.2x0.4x0.1] [N/A:N/A] Measurements L x W x D (cm) Post Debridement [2:0.006] [N/A:N/A] Volume: (cm) Procedures Performed: Debridement [N/A:N/A] Treatment Notes Wound #2 (Right Toe  Great) 1. Cleanse With Wound Cleanser 3. Primary Dressing Applied Collegen AG Hydrogel or K-Y Jelly 4. Secondary Dressing Dry Gauze Roll Gauze 5. Secured With Tape Notes Horticulturist, commercial) Signed: 08/14/2019 5:33:33 PM By: Levan Hurst RN, BSN Signed: 08/14/2019 5:40:04 PM By: Linton Ham MD Entered By: Linton Ham on 08/14/2019 17:09:03 -------------------------------------------------------------------------------- Multi-Disciplinary Care Plan Details Patient Name: Date of Service: [name], [name] 08/14/2019 3:15 PM Medical Record [id number] Patient Account Number: [id number] Date of Birth/Sex: Treating RN: 1923-08-25 (86 y.o. [name] Primary Care Davien Malone: Benny Lennert Other Clinician: Referring Aleighna Wojtas: Treating Areli Jowett/Extender:Robson, Shayne Alken, LISA Weeks in Treatment: 2 Active Inactive Wound/Skin Impairment Nursing Diagnoses: Knowledge deficit related to ulceration/compromised skin integrity Goals: Patient/caregiver will verbalize understanding of skin care regimen Date Initiated: 07/25/2019 Target Resolution Date: 08/25/2019 Goal Status: Active Ulcer/skin breakdown will have a volume reduction of 30% by week 4 Date Initiated: 07/25/2019 Target Resolution Date: 08/25/2019 Goal Status: Active Interventions: Assess patient/caregiver ability to obtain necessary supplies Assess patient/caregiver ability to perform ulcer/skin care regimen upon admission and as needed Assess ulceration(s) every visit Notes: Notes: Electronic Signature(s) Signed: 08/14/2019 5:33:33 PM By: Levan Hurst RN, BSN Entered By: Levan Hurst on 08/14/2019 15:45:29 -------------------------------------------------------------------------------- Pain Assessment Details Patient Name: Date of Service: [name] 08/14/2019 3:15 PM Medical Record [id number] Patient Account Number: [id number] Date of Birth/Sex: Treating RN: 04/25/1923 (86  y.o. [name] Primary Care Norvella Loscalzo: Benny Lennert Other Clinician: Referring Joleigh Mineau: Treating Falynn Ailey/Extender:Robson, Shayne Alken, LISA Weeks in Treatment: 2 Active Problems Location of Pain Severity and Description of Pain Patient Has Paino No Site Locations Rate the pain. Current Pain Level: 0 Pain Management and Medication Current Pain Management: Medication: No Cold Application: No Rest: No Massage: No Activity: No T.E.N.S.: No Heat Application: No Leg drop or elevation: No Is the Current Pain Management Adequate: Adequate How does your wound impact your activities of daily livingo Sleep: No Bathing: No Appetite: No Relationship With Others: No Bladder Continence: No Emotions: No Bowel Continence: No Work: No Toileting: No Drive: No Dressing: No Hobbies: No Electronic Signature(s) Signed: 08/14/2019 5:14:33 PM By: Deon Pilling Signed: 08/14/2019 5:33:33 PM By: Levan Hurst RN, BSN Entered By: Deon Pilling on 08/14/2019 15:38:46 -------------------------------------------------------------------------------- Patient/Caregiver Education Details Patient Name: Date of Service: [name] 4/26/2021andnbsp3:15 PM Medical Record [id number] Patient Account Number: [id number] Date of Birth/Gender: 05/05/1923 (86 y.o. M) Treating RN: Levan Hurst Primary Care Physician: Benny Lennert Other Clinician: Referring Physician: Treating Physician/Extender:Robson, Shayne Alken, Mart Piggs in Treatment: 2 Education Assessment Education Provided To: Patient Education Topics Provided Wound/Skin Impairment: Methods: Explain/Verbal Responses: State content correctly Electronic Signature(s) Signed: 08/14/2019 5:33:33 PM By: Levan Hurst RN, BSN Entered By: Levan Hurst on 08/14/2019 15:45:42 -------------------------------------------------------------------------------- Wound Assessment Details Patient Name: Date of Service: [name] 08/14/2019 3:15 PM Medical Record [id number] Patient Account  Number: [id number] Date of Birth/Sex: Treating RN: 1924-01-17 (86 y.o. [name] Primary Care Modell Fendrick: Benny Lennert Other Clinician: Referring Zeynep Fantroy: Treating Adryanna Friedt/Extender:Robson, Shayne Alken, LISA Weeks in Treatment: 2 Wound Status Wound Number: 2 Primary Diabetic Wound/Ulcer of the Lower Extremity Wound Location: Right Toe Great Etiology: Wounding Event: Gradually Appeared Wound Open Date Acquired: 06/15/2019 Status: Weeks Of Treatment:2 ComorbidCataracts, Arrhythmia, Hypertension, Peripheral Clustered Wound: No History: Arterial Disease, Type II Diabetes, Osteomyelitis, Dementia, Neuropathy, Received Chemotherapy Photos Photo Uploaded By: Mikeal Hawthorne on 08/15/2019 14:54:50 Wound Measurements Length: (cm) 0.2 % Reducti Width: (cm) 0.4 % Reducti Depth: (cm) 0.1 Epithelia Area: (cm) 0.063 Tunnelin Volume: (cm) 0.006 Undermin Wound Description Classification: Grade 2 Wound Margin: Distinct, outline attached Exudate Amount: None Present Wound Bed Granulation Amount: None Present (0%) Necrotic Amount: None Present (0%) Foul Odor After Cleansing: No Slough/Fibrino Yes Exposed Structure Fascia Exposed: No Fat Layer (Subcutaneous Tissue) Exposed: Yes Tendon Exposed: No Muscle Exposed: No Joint Exposed: No Bone Exposed: Yes on in Area: 33% on in Volume: 78.6% lization: None g: No ing: No Treatment Notes Wound #2 (Right Toe Great) 1. Cleanse With Wound Cleanser 3. Primary Dressing Applied Collegen AG Hydrogel or K-Y Jelly 4. Secondary Dressing Dry Gauze Roll Gauze 5. Secured With Tape Notes Horticulturist, commercial) Signed: 08/14/2019 5:14:33 PM By: Deon Pilling Signed: 08/14/2019 5:33:33 PM By: Levan Hurst RN, BSN Entered By: Deon Pilling on 08/14/2019 15:45:17 -------------------------------------------------------------------------------- Vitals  Details Patient Name: Date of Service: [name] 08/14/2019 3:15 PM Medical Record [id number] Patient Account Number: [id number] Date of Birth/Sex: Treating RN: [birth year] (86 y.o. [name] Primary Care Ritaj Dullea: Benny Lennert Other Clinician: Referring Braelyn Bordonaro: Treating Dain Laseter/Extender:Robson, Shayne Alken, LISA Weeks in Treatment: 2 Vital Signs Time Taken: 15:35 Temperature (F): 97.6 Height (in): 70 Pulse (bpm): 63 Weight (lbs): 140 Respiratory Rate (breaths/min): 20 Body Mass Index (BMI): 20.1 Blood Pressure (mmHg): 112/62 Reference Range: 80 - 120 mg / dl Electronic Signature(s) Signed: 08/15/2019 5:26:58 PM By: [name] Entered By: [name] on 08/14/2019 16:27:11

## 2019-08-16 DIAGNOSIS — I4821 Permanent atrial fibrillation: Secondary | ICD-10-CM | POA: Diagnosis not present

## 2019-08-16 DIAGNOSIS — I10 Essential (primary) hypertension: Secondary | ICD-10-CM | POA: Diagnosis not present

## 2019-08-16 DIAGNOSIS — L97519 Non-pressure chronic ulcer of other part of right foot with unspecified severity: Secondary | ICD-10-CM | POA: Diagnosis not present

## 2019-08-16 DIAGNOSIS — E1165 Type 2 diabetes mellitus with hyperglycemia: Secondary | ICD-10-CM | POA: Diagnosis not present

## 2019-08-16 DIAGNOSIS — C61 Malignant neoplasm of prostate: Secondary | ICD-10-CM | POA: Diagnosis not present

## 2019-08-16 DIAGNOSIS — R1312 Dysphagia, oropharyngeal phase: Secondary | ICD-10-CM | POA: Diagnosis not present

## 2019-08-16 DIAGNOSIS — M6281 Muscle weakness (generalized): Secondary | ICD-10-CM | POA: Diagnosis not present

## 2019-08-16 DIAGNOSIS — Z89422 Acquired absence of other left toe(s): Secondary | ICD-10-CM | POA: Diagnosis not present

## 2019-08-16 DIAGNOSIS — Z7901 Long term (current) use of anticoagulants: Secondary | ICD-10-CM | POA: Diagnosis not present

## 2019-08-16 DIAGNOSIS — C8581 Other specified types of non-Hodgkin lymphoma, lymph nodes of head, face, and neck: Secondary | ICD-10-CM | POA: Diagnosis not present

## 2019-08-16 DIAGNOSIS — Z7984 Long term (current) use of oral hypoglycemic drugs: Secondary | ICD-10-CM | POA: Diagnosis not present

## 2019-08-16 DIAGNOSIS — R269 Unspecified abnormalities of gait and mobility: Secondary | ICD-10-CM | POA: Diagnosis not present

## 2019-08-16 DIAGNOSIS — E43 Unspecified severe protein-calorie malnutrition: Secondary | ICD-10-CM | POA: Diagnosis not present

## 2019-08-16 NOTE — Telephone Encounter (Signed)
From a cardiac standpoint the medications we are managing would be the eliquis, diltiazem, lasix, and lisinopril  The eliquis is appropirately dosed however if he continues to lose more weight over time we may need to lower the dose   The diltiazem and lisinopril would only be too much if we started seeing low heart rates or low blood pressures. From his vitals during his 08/09/[facility] his vitals were fine, but just something to continue to monitor  If not eating or drinking as much we could change his lasix to just prn swelling, I think that would be the only change at this time   Zandra Abts MD

## 2019-08-17 ENCOUNTER — Ambulatory Visit (INDEPENDENT_AMBULATORY_CARE_PROVIDER_SITE_OTHER): Payer: Medicare Other | Admitting: Nurse Practitioner

## 2019-08-18 DIAGNOSIS — C8581 Other specified types of non-Hodgkin lymphoma, lymph nodes of head, face, and neck: Secondary | ICD-10-CM | POA: Diagnosis not present

## 2019-08-18 DIAGNOSIS — C61 Malignant neoplasm of prostate: Secondary | ICD-10-CM | POA: Diagnosis not present

## 2019-08-18 DIAGNOSIS — E1165 Type 2 diabetes mellitus with hyperglycemia: Secondary | ICD-10-CM | POA: Diagnosis not present

## 2019-08-18 DIAGNOSIS — L97519 Non-pressure chronic ulcer of other part of right foot with unspecified severity: Secondary | ICD-10-CM | POA: Diagnosis not present

## 2019-08-18 DIAGNOSIS — R269 Unspecified abnormalities of gait and mobility: Secondary | ICD-10-CM | POA: Diagnosis not present

## 2019-08-18 DIAGNOSIS — I10 Essential (primary) hypertension: Secondary | ICD-10-CM | POA: Diagnosis not present

## 2019-08-21 ENCOUNTER — Ambulatory Visit (INDEPENDENT_AMBULATORY_CARE_PROVIDER_SITE_OTHER): Payer: Medicare Other | Admitting: Nurse Practitioner

## 2019-08-21 ENCOUNTER — Encounter (INDEPENDENT_AMBULATORY_CARE_PROVIDER_SITE_OTHER): Payer: Self-pay

## 2019-08-21 DIAGNOSIS — R269 Unspecified abnormalities of gait and mobility: Secondary | ICD-10-CM | POA: Diagnosis not present

## 2019-08-21 DIAGNOSIS — C61 Malignant neoplasm of prostate: Secondary | ICD-10-CM | POA: Diagnosis not present

## 2019-08-21 DIAGNOSIS — E1165 Type 2 diabetes mellitus with hyperglycemia: Secondary | ICD-10-CM | POA: Diagnosis not present

## 2019-08-21 DIAGNOSIS — I10 Essential (primary) hypertension: Secondary | ICD-10-CM | POA: Diagnosis not present

## 2019-08-21 DIAGNOSIS — L97519 Non-pressure chronic ulcer of other part of right foot with unspecified severity: Secondary | ICD-10-CM | POA: Diagnosis not present

## 2019-08-21 DIAGNOSIS — C8581 Other specified types of non-Hodgkin lymphoma, lymph nodes of head, face, and neck: Secondary | ICD-10-CM | POA: Diagnosis not present

## 2019-08-22 ENCOUNTER — Encounter: Payer: Self-pay | Admitting: Family Medicine

## 2019-08-22 DIAGNOSIS — B351 Tinea unguium: Secondary | ICD-10-CM | POA: Diagnosis not present

## 2019-08-22 DIAGNOSIS — E1142 Type 2 diabetes mellitus with diabetic polyneuropathy: Secondary | ICD-10-CM | POA: Diagnosis not present

## 2019-08-23 ENCOUNTER — Encounter: Payer: Self-pay | Admitting: Family Medicine

## 2019-08-23 ENCOUNTER — Ambulatory Visit (INDEPENDENT_AMBULATORY_CARE_PROVIDER_SITE_OTHER): Payer: Medicare Other | Admitting: Family Medicine

## 2019-08-23 ENCOUNTER — Other Ambulatory Visit: Payer: Self-pay

## 2019-08-23 ENCOUNTER — Ambulatory Visit (INDEPENDENT_AMBULATORY_CARE_PROVIDER_SITE_OTHER): Payer: Medicare Other | Admitting: Nurse Practitioner

## 2019-08-23 VITALS — BP 116/69 | HR 77 | Temp 98.4°F | Wt 139.4 lb

## 2019-08-23 DIAGNOSIS — L97519 Non-pressure chronic ulcer of other part of right foot with unspecified severity: Secondary | ICD-10-CM | POA: Diagnosis not present

## 2019-08-23 DIAGNOSIS — C8581 Other specified types of non-Hodgkin lymphoma, lymph nodes of head, face, and neck: Secondary | ICD-10-CM | POA: Diagnosis not present

## 2019-08-23 DIAGNOSIS — H6123 Impacted cerumen, bilateral: Secondary | ICD-10-CM

## 2019-08-23 DIAGNOSIS — I10 Essential (primary) hypertension: Secondary | ICD-10-CM | POA: Diagnosis not present

## 2019-08-23 DIAGNOSIS — F0390 Unspecified dementia without behavioral disturbance: Secondary | ICD-10-CM

## 2019-08-23 DIAGNOSIS — E1165 Type 2 diabetes mellitus with hyperglycemia: Secondary | ICD-10-CM | POA: Diagnosis not present

## 2019-08-23 DIAGNOSIS — E1140 Type 2 diabetes mellitus with diabetic neuropathy, unspecified: Secondary | ICD-10-CM | POA: Diagnosis not present

## 2019-08-23 DIAGNOSIS — C61 Malignant neoplasm of prostate: Secondary | ICD-10-CM | POA: Diagnosis not present

## 2019-08-23 DIAGNOSIS — R269 Unspecified abnormalities of gait and mobility: Secondary | ICD-10-CM | POA: Diagnosis not present

## 2019-08-23 MED ORDER — EASYMAX TEST VI STRP: ORAL_STRIP | 12 refills | Status: DC

## 2019-08-24 ENCOUNTER — Inpatient Hospital Stay (HOSPITAL_BASED_OUTPATIENT_CLINIC_OR_DEPARTMENT_OTHER): Payer: Medicare Other | Admitting: Hematology

## 2019-08-24 ENCOUNTER — Inpatient Hospital Stay (HOSPITAL_COMMUNITY): Payer: Medicare Other

## 2019-08-24 ENCOUNTER — Encounter (HOSPITAL_COMMUNITY): Payer: Self-pay | Admitting: Hematology

## 2019-08-24 ENCOUNTER — Other Ambulatory Visit: Payer: Self-pay

## 2019-08-24 ENCOUNTER — Inpatient Hospital Stay (HOSPITAL_COMMUNITY): Payer: Medicare Other | Attending: Hematology

## 2019-08-24 DIAGNOSIS — Z8349 Family history of other endocrine, nutritional and metabolic diseases: Secondary | ICD-10-CM | POA: Diagnosis not present

## 2019-08-24 DIAGNOSIS — Z7952 Long term (current) use of systemic steroids: Secondary | ICD-10-CM | POA: Diagnosis not present

## 2019-08-24 DIAGNOSIS — C7951 Secondary malignant neoplasm of bone: Secondary | ICD-10-CM | POA: Diagnosis not present

## 2019-08-24 DIAGNOSIS — Z803 Family history of malignant neoplasm of breast: Secondary | ICD-10-CM | POA: Diagnosis not present

## 2019-08-24 DIAGNOSIS — Z800 Family history of malignant neoplasm of digestive organs: Secondary | ICD-10-CM | POA: Insufficient documentation

## 2019-08-24 DIAGNOSIS — Z7984 Long term (current) use of oral hypoglycemic drugs: Secondary | ICD-10-CM | POA: Diagnosis not present

## 2019-08-24 DIAGNOSIS — L97519 Non-pressure chronic ulcer of other part of right foot with unspecified severity: Secondary | ICD-10-CM | POA: Diagnosis not present

## 2019-08-24 DIAGNOSIS — I4891 Unspecified atrial fibrillation: Secondary | ICD-10-CM | POA: Insufficient documentation

## 2019-08-24 DIAGNOSIS — C61 Malignant neoplasm of prostate: Secondary | ICD-10-CM | POA: Insufficient documentation

## 2019-08-24 DIAGNOSIS — I10 Essential (primary) hypertension: Secondary | ICD-10-CM | POA: Diagnosis not present

## 2019-08-24 DIAGNOSIS — C49A3 Gastrointestinal stromal tumor of small intestine: Secondary | ICD-10-CM | POA: Diagnosis not present

## 2019-08-24 DIAGNOSIS — E119 Type 2 diabetes mellitus without complications: Secondary | ICD-10-CM | POA: Diagnosis not present

## 2019-08-24 DIAGNOSIS — Z7901 Long term (current) use of anticoagulants: Secondary | ICD-10-CM | POA: Insufficient documentation

## 2019-08-24 DIAGNOSIS — C8581 Other specified types of non-Hodgkin lymphoma, lymph nodes of head, face, and neck: Secondary | ICD-10-CM | POA: Diagnosis not present

## 2019-08-24 DIAGNOSIS — Z79899 Other long term (current) drug therapy: Secondary | ICD-10-CM | POA: Diagnosis not present

## 2019-08-24 DIAGNOSIS — D472 Monoclonal gammopathy: Secondary | ICD-10-CM | POA: Diagnosis not present

## 2019-08-24 DIAGNOSIS — R269 Unspecified abnormalities of gait and mobility: Secondary | ICD-10-CM | POA: Diagnosis not present

## 2019-08-24 DIAGNOSIS — E1165 Type 2 diabetes mellitus with hyperglycemia: Secondary | ICD-10-CM | POA: Diagnosis not present

## 2019-08-24 DIAGNOSIS — H6123 Impacted cerumen, bilateral: Secondary | ICD-10-CM | POA: Insufficient documentation

## 2019-08-24 DIAGNOSIS — Z8249 Family history of ischemic heart disease and other diseases of the circulatory system: Secondary | ICD-10-CM | POA: Insufficient documentation

## 2019-08-24 DIAGNOSIS — E1140 Type 2 diabetes mellitus with diabetic neuropathy, unspecified: Secondary | ICD-10-CM

## 2019-08-24 LAB — CBC WITH DIFFERENTIAL/PLATELET
Abs Immature Granulocytes: 0.18 10*3/uL — ABNORMAL HIGH (ref 0.00–0.07)
Basophils Absolute: 0 10*3/uL (ref 0.0–0.1)
Basophils Relative: 0 %
Eosinophils Absolute: 0.1 10*3/uL (ref 0.0–0.5)
Eosinophils Relative: 1 %
HCT: 34.3 % — ABNORMAL LOW (ref 39.0–52.0)
Hemoglobin: 11.7 g/dL — ABNORMAL LOW (ref 13.0–17.0)
Immature Granulocytes: 2 %
Lymphocytes Relative: 32 %
Lymphs Abs: 2.7 10*3/uL (ref 0.7–4.0)
MCH: 32.6 pg (ref 26.0–34.0)
MCHC: 34.1 g/dL (ref 30.0–36.0)
MCV: 95.5 fL (ref 80.0–100.0)
Monocytes Absolute: 1.3 10*3/uL — ABNORMAL HIGH (ref 0.1–1.0)
Monocytes Relative: 15 %
Neutro Abs: 4.3 10*3/uL (ref 1.7–7.7)
Neutrophils Relative %: 50 %
Platelets: 205 10*3/uL (ref 150–400)
RBC: 3.59 MIL/uL — ABNORMAL LOW (ref 4.22–5.81)
RDW: 13.1 % (ref 11.5–15.5)
WBC: 8.5 10*3/uL (ref 4.0–10.5)
nRBC: 0 % (ref 0.0–0.2)

## 2019-08-24 LAB — COMPREHENSIVE METABOLIC PANEL
ALT: 14 U/L (ref 0–44)
AST: 13 U/L — ABNORMAL LOW (ref 15–41)
Albumin: 3.7 g/dL (ref 3.5–5.0)
Alkaline Phosphatase: 63 U/L (ref 38–126)
Anion gap: 6 (ref 5–15)
BUN: 18 mg/dL (ref 8–23)
CO2: 24 mmol/L (ref 22–32)
Calcium: 10.3 mg/dL (ref 8.9–10.3)
Chloride: 106 mmol/L (ref 98–111)
Creatinine, Ser: 1.05 mg/dL (ref 0.61–1.24)
GFR calc Af Amer: >60 mL/min (ref >60)
GFR calc non Af Amer: >60 mL/min (ref >60)
Glucose, Bld: 148 mg/dL — ABNORMAL HIGH (ref 70–99)
Potassium: 4.7 mmol/L (ref 3.5–5.1)
Sodium: 136 mmol/L (ref 135–145)
Total Bilirubin: 0.7 mg/dL (ref 0.3–1.2)
Total Protein: 6.6 g/dL (ref 6.5–8.1)

## 2019-08-24 LAB — LACTATE DEHYDROGENASE: LDH: 89 U/L — ABNORMAL LOW (ref 98–192)

## 2019-08-24 LAB — PSA: Prostatic Specific Antigen: <0.01 ng/mL (ref 0.00–4.00)

## 2019-08-24 MED ORDER — DENOSUMAB 120 MG/1.7ML ~~LOC~~ SOLN
120.0000 mg | Freq: Once | SUBCUTANEOUS | Status: AC
  Administered 2019-08-24: 120 mg via SUBCUTANEOUS

## 2019-08-24 MED ORDER — EASYMAX TEST VI STRP: ORAL_STRIP | 12 refills | Status: DC

## 2019-08-24 MED ORDER — DENOSUMAB 120 MG/1.7ML ~~LOC~~ SOLN
SUBCUTANEOUS | Status: AC
  Filled 2019-08-24: qty 1.7

## 2019-08-24 NOTE — Progress Notes (Signed)
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  The Multi-Gene Panel offered by [facility] includes sequencing and/or deletion duplication testing of the following 80 genes: ALK, APC, ATM, AXIN2,BAP1,  BARD1, BLM, BMPR1A, BRCA1, BRCA2, BRIP1, CASR, CDC73, CDH1, CDK4, CDKN1B, CDKN1C, CDKN2A (p14ARF), CDKN2A (p16INK4a), CEBPA, CHEK2, CTNNA1, DICER1, DIS3L2, EGFR (c.2369C>T, p.Thr790Met variant only), EPCAM (Deletion/duplication testing only), FH, FLCN, GATA2, GPC3, GREM1 (Promoter region deletion/duplication testing only), HOXB13 (c.251G>A, p.Gly84Glu), HRAS, KIT, MAX, MEN1, MET, MITF (c.952G>A, p.Glu318Lys variant only), MLH1, MSH2, MSH3, MSH6, MUTYH, NBN, NF1, NF2, NTHL1, PALB2, PDGFRA, PHOX2B, PMS2, POLD1, POLE, POT1, PRKAR1A, PTCH1, PTEN, RAD50, RAD51C, RAD51D, RB1, RECQL4, RET, RUNX1, SDHAF2, SDHA (sequence changes only), SDHB, SDHC, SDHD, SMAD4, SMARCA4, SMARCB1, SMARCE1, STK11, SUFU, TERT, TERT, TMEM127, TP53, TSC1, TSC2, VHL, WRN and WT1.  The report date is February 08, 2017.    Prostate cancer ([facility])  09/08/2013 Initial Diagnosis   Prostate cancer ([place])   02/08/2017 Genetic Testing   ATM Gain (Exons 62-63) VUS identified on the multi-gene panel.  The Multi-Gene Panel offered by [facility] includes sequencing and/or deletion duplication testing of the following 80 genes: ALK, APC, ATM, AXIN2,BAP1,  BARD1, BLM, BMPR1A, BRCA1, BRCA2, BRIP1, CASR,  CDC73, CDH1, CDK4, CDKN1B, CDKN1C, CDKN2A (p14ARF), CDKN2A (p16INK4a), CEBPA, CHEK2, CTNNA1, DICER1, DIS3L2, EGFR (c.2369C>T, p.Thr790Met variant only), EPCAM (Deletion/duplication testing only), FH, FLCN, GATA2, GPC3, GREM1 (Promoter region deletion/duplication testing only), HOXB13 (c.251G>A, p.Gly84Glu), HRAS, KIT, MAX, MEN1, MET, MITF (c.952G>A, p.Glu318Lys variant only), MLH1, MSH2, MSH3, MSH6, MUTYH, NBN, NF1, NF2, NTHL1, PALB2, PDGFRA, PHOX2B, PMS2, POLD1, POLE, POT1, PRKAR1A, PTCH1, PTEN, RAD50, RAD51C, RAD51D, RB1, RECQL4, RET, RUNX1, SDHAF2, SDHA (sequence changes only), SDHB, SDHC, SDHD, SMAD4, SMARCA4, SMARCB1, SMARCE1, STK11, SUFU, TERT, TERT, TMEM127, TP53, TSC1, TSC2, VHL, WRN and WT1.  The report date is February 08, 2017.       CANCER STAGING: Cancer Staging NHL (non-Hodgkin's lymphoma) (HCC) Staging form: Lymphoid Neoplasms, AJCC 6th Edition - Clinical: Stage III - Signed by Baird Cancer, PA on 08/18/2011    INTERVAL HISTORY:  Mr. Geisen 86 y.o. male seen for follow-up of metastatic prostate cancer to the bones.  He is taking abiraterone 2 tablets daily and prednisone 2.5 mg daily.  He is accompanied by his daughter.  His appetite and energy levels are 50%.  He has occasional loose stools which is also stable.  Denies any new onset bone pains.  REVIEW OF SYSTEMS:  Review of Systems  Gastrointestinal: Positive for diarrhea.  All other systems reviewed and are negative.    PAST MEDICAL/SURGICAL HISTORY:  Past Medical History:  Diagnosis Date   Acute bronchitis    Acute lower UTI 11/25/2017   Amputation of toe of left foot ([facility]) 07/30/2017, 08/27/2017    Great toe 07/30/2017 Second toe 08/27/2017    Arthritis    Aspiration pneumonia ([facility]) 01/31/2018   Atrial fibrillation ([facility])    Dr. Harl Bowie- [facility] follows saw 11'14   Bladder stones    tx. with oral meds and antibiotics, now surgery  planned   Bone metastases ([facility]) 08/21/2015   Cataracts, both eyes      surgery planned May 2015   Community acquired pneumonia 04/16/2018   Cystoid macular edema 04/21/2015    Right eye 04/21/2015    Dehydration 04/16/2018   Dementia without behavioral disturbance ([facility]) 04/16/2018   Diabetes mellitus without complication ([facility])    Type II   Diarrhea 11/25/2017   Dyspnea    with activity   Dysrhythmia    Afib   Family history of breast cancer    Family history of colon cancer    Genetic testing 02/09/2017   GERD (gastroesophageal reflux disease)    GIST (gastrointestinal stromal tumor), 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 Margot Chimes, thus far without recurrence.    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 PET scan was also positive in the spleen and small bowel region, but bone marrow aspiration and biopsy were negative. So he essentially had stage IIIAs. He received R-CHOP x6 cycles with CR established by PET scan criteria on 11/23/2003 with no evidence for relapse th   Numbness 02/19/2017   Per [facility] new patient packet   Osteomyelitis ([facility]) 06/29/2017   toe of left foot    PAD (peripheral artery disease) ([facility]) 08/27/2017   Polyneuropathy 02/19/2017   Postural dizziness with presyncope 02/19/2017   S/P TURP 08/10/2013   Sepsis ([facility]) 07/02/2018   Skin cancer    Basal cell- face,head, neck,  arms, legs, Back    Urinary frequency 02/19/2017   Past Surgical History:  Procedure Laterality Date   AMPUTATION Left 06/29/2017   Procedure: AMPUTATION LEFT GREAT TOE;  Surgeon: [name], MD;  Location: [facility];  Service: Vascular;  Laterality: Left;   AMPUTATION Left 08/27/2017   Procedure: AMPUTATION Left SECOND TOE;  Surgeon: [name], MD;  Location: [facility];  Service: Vascular;  Laterality: Left;   CATARACT EXTRACTION Bilateral 2015   CHOLECYSTECTOMY     COLON RESECTION     small bowel   CYSTOSCOPY WITH LITHOLAPAXY N/A 08/10/2013   Procedure: CYSTOSCOPY WITH LITHOLAPAXY WITH [name];  Surgeon: [name], MD;  [facility];  Service: Urology;  Laterality: N/A;   ESOPHAGEAL DILATION N/A 02/27/2016   Procedure: ESOPHAGEAL DILATION;  Surgeon: [name], MD;  [facility];  Service: Endoscopy;  Laterality: N/A;   ESOPHAGEAL DILATION N/A 07/07/2018   Procedure: ESOPHAGEAL DILATION;  Surgeon: [name], MD;  [facility];  Service: Endoscopy;  Laterality: N/A;   ESOPHAGOGASTRODUODENOSCOPY N/A 02/27/2016   Procedure: ESOPHAGOGASTRODUODENOSCOPY (EGD);  Surgeon: [name], MD;  [facility];  Service: Endoscopy;  Laterality: N/A;  12:00   ESOPHAGOGASTRODUODENOSCOPY N/A 07/07/2018   Procedure: ESOPHAGOGASTRODUODENOSCOPY (EGD);  Surgeon: [name], MD;  [facility];  Service: Endoscopy;  Laterality: N/A;   INCISION AND DRAINAGE PERIRECTAL ABSCESS     LOWER EXTREMITY ANGIOGRAPHY N/A  06/25/2017   Procedure: LOWER EXTREMITY ANGIOGRAPHY;  Surgeon: [name], MD;  Location: [facility];  Service: Cardiovascular;  Laterality: N/A;   LYMPH NODE DISSECTION Left    '05-neck   PORT-A-CATH REMOVAL     PORTACATH PLACEMENT     insertion and removal -last chemotherapy 10 yrs ago   TRANSURETHRAL RESECTION OF PROSTATE N/A 08/10/2013   Procedure: TRANSURETHRAL RESECTION OF THE PROSTATE WITH GYRUS  INSTRUMENTS;  Surgeon: [name], MD;  [facility];  Service: Urology;  Laterality: N/A;     SOCIAL HISTORY:  Social History   Socioeconomic History   Marital status: Married    Spouse name: Not on file   Number of children: Not on file   Years of education: Not on file   Highest education level: Not on file  Occupational History   Occupation: retired    Fish farm manager: RETIRED    Comment: salesman  Tobacco Use   Smoking status: Never Smoker   Smokeless tobacco: Never Used  Substance and Sexual Activity   Alcohol use: No   Drug use: No   Sexual activity: Not Currently  Other Topics Concern   Not on file  Social History Narrative   Diet: Soft foods      Do you drink/eat things with caffeine No      Marital Status: Marred   What year were you married? 1949      Do you live in a house, apartment, assisted living, condo, trailer, etc.? House      Is it one or more stories? One      How many persons live in your home? 2         Do you have any pets in your home?(please list) No      Highest level of education completed: 9th      Current or past profession: [employer] you exercise? No Type and how often:      Living Will? Yes      DNR form? NO  If not do you wish to discuss one? Yes      POA/HPOA forms? Yes      Difficulty bathing or dressing yourself? Yes      Difficulty preparing food or eating? Yes      Difficulty managing medications? Yes      Difficulty managing your finances? Yes      Difficulty affording your medications? No                     Social Determinants of Radio broadcast assistant Strain:    Difficulty of Paying Living Expenses:   Food Insecurity:    Worried About Charity fundraiser in the Last Year:    Arboriculturist in the Last Year:   Transportation Needs:    Film/video editor (Medical):    Lack of Transportation (Non-Medical):   Physical Activity:    Days of Exercise per  Week:    Minutes of Exercise per Session:   Stress:    Feeling of Stress :   Social Connections:    Frequency of Communication with Friends and Family:    Frequency of Social Gatherings with Friends and Family:    Attends Religious Services:    Active Member of Clubs or Organizations:    Attends Archivist Meetings:    Marital Status:   Intimate Partner Violence:    Fear of Current  or Ex-Partner:    Emotionally Abused:    Physically Abused:    Sexually Abused:     FAMILY HISTORY:  Family History  Problem Relation Age of Onset   Heart attack Mother        died in her 13s   Other Father 43       pneumonia - died   Breast cancer Sister        dx in her 75s-60s   Colon cancer Brother        dx in his 86s   Cancer Sister        TAH/BSO due to "male cancer"   Breast cancer Daughter 66   Breast cancer Daughter 37       DCIS - ATM VUS on [facility] 83 gene panel in 2018   Breast cancer Daughter 43       DCIS - Negative on [facility] MyRisk 25 gene apnel in 2015   Thyroid cancer Daughter 30       Medullary and Papillary   Thyroid nodules Grandchild     CURRENT MEDICATIONS:  Outpatient Encounter Medications as of 08/24/2019  Medication Sig   abiraterone acetate (ZYTIGA) 250 MG tablet Take 250 mg by mouth daily. Take on an empty stomach 1 hour before or 2 hours after a meal    acetaminophen (TYLENOL) 500 MG tablet Take 500-1,000 mg by mouth every 8 (eight) hours as needed for mild pain, fever or headache.    apixaban (ELIQUIS) 5 MG TABS tablet Take 5 mg by mouth 2 (two) times daily.   blood glucose meter kit and supplies Dispense based on patient and insurance preference. Use four times daily as directed. (FOR ICD-10 E10.9, E11.9).   Calcium Polycarbophil (FIBER-CAPS PO) 3 by mouth in the morning and 2 by mouth in the evening   Cholecalciferol (VITAMIN D3) 1000 units CAPS Take 1,000 Units by mouth every morning.    diphenhydrAMINE-APAP, sleep,  (TYLENOL PM EXTRA STRENGTH PO) Take 1 tablet by mouth at bedtime.    donepezil (ARICEPT) 10 MG tablet Take 1 tablet by mouth once daily   feeding supplement, ENSURE ENLIVE, (ENSURE ENLIVE) LIQD Take 237 mLs by mouth daily. CHOCOLATE    fluticasone (FLONASE) 50 MCG/ACT nasal spray Place 2 sprays into both nostrils as needed.    glipiZIDE (GLUCOTROL XL) 5 MG 24 hr tablet Take 5 mg by mouth daily with breakfast.    glucose blood (EASYMAX TEST) test strip Use as instructed   Lancets 28G MISC 1 kit by Does not apply route every morning.   leuprolide (LUPRON) 11.25 MG KIT injection Inject into the muscle every 6 (six) months.    lisinopril (ZESTRIL) 20 MG tablet Take 1 tablet by mouth once daily   metFORMIN (GLUCOPHAGE) 850 MG tablet Take 850 mg by mouth 2 (two) times daily with a meal.    Multiple Vitamin (MULTIVITAMIN WITH MINERALS) TABS tablet Take 1 tablet by mouth every morning.   omeprazole (PRILOSEC) 40 MG capsule Take 40 mg by mouth daily.    predniSONE (DELTASONE) 5 MG tablet Take 1/2 (one-half) tablet by mouth twice daily   trimethoprim (TRIMPEX) 100 MG tablet Take 100 mg by mouth daily.    [DISCONTINUED] cephALEXin (KEFLEX) 500 MG capsule    [DISCONTINUED] Cranberry-Vitamin C-Vitamin E (CRANBERRY PLUS VITAMIN C) 4200-20-3 MG-MG-UNIT CAPS Take 1 capsule by mouth every morning.   [DISCONTINUED] diltiazem (CARDIZEM) 60 MG tablet Take 1 tablet by mouth twice daily   [DISCONTINUED] doxycycline (MONODOX) 100 MG  capsule    furosemide (LASIX) 20 MG tablet Take 20 mg by mouth daily as needed.    mirtazapine (REMERON) 15 MG tablet Take 1 tablet (15 mg total) by mouth at bedtime.   [DISCONTINUED] Potassium Chloride (KLOR-CON 10 PO) Take 10 mg by mouth 2 (two) times daily.   [EXPIRED] denosumab (XGEVA) injection 120 mg    No facility-administered encounter medications on file as of 08/24/2019.    ALLERGIES:  Allergies  Allergen Reactions   Tape Other (See Comments)    SKIN  IS VERY THIN AND WILL TEAR AND BRUISE VERY EASILY!!!!   Augmentin [Amoxicillin-Pot Clavulanate] Rash and Other (See Comments)    Redness of skin     PHYSICAL EXAM:  ECOG Performance status: 1  Vitals:   08/24/19 1000  BP: (!) 112/57  Pulse: (!) 56  Resp: 16  Temp: (!) 95.7 F (35.4 C)  SpO2: 100%   Filed Weights   08/24/19 1000  Weight: 140 lb 6 oz (63.7 kg)    Physical Exam Constitutional:      Appearance: Normal appearance. He is normal weight.  Cardiovascular:     Rate and Rhythm: Normal rate and regular rhythm.     Heart sounds: Normal heart sounds.  Pulmonary:     Effort: Pulmonary effort is normal.     Breath sounds: Normal breath sounds.  Abdominal:     General: There is no distension.     Palpations: Abdomen is soft. There is no mass.  Musculoskeletal:        General: No swelling.  Skin:    General: Skin is warm.  Neurological:     Mental Status: He is alert and oriented to person, place, and time. Mental status is at baseline.  Psychiatric:        Mood and Affect: Mood normal.        Behavior: Behavior normal.      LABORATORY DATA:  I have reviewed the labs as listed.  CBC    Component Value Date/Time   WBC 8.5 08/24/2019 1003   RBC 3.59 (L) 08/24/2019 1003   HGB 11.7 (L) 08/24/2019 1003   HCT 34.3 (L) 08/24/2019 1003   PLT 205 08/24/2019 1003   MCV 95.5 08/24/2019 1003   MCH 32.6 08/24/2019 1003   MCHC 34.1 08/24/2019 1003   RDW 13.1 08/24/2019 1003   LYMPHSABS 2.7 08/24/2019 1003   MONOABS 1.3 (H) 08/24/2019 1003   EOSABS 0.1 08/24/2019 1003   BASOSABS 0.0 08/24/2019 1003   CMP Latest Ref Rng & Units 08/24/2019 08/09/2019 07/18/2019  Glucose 70 - 99 mg/dL 148(H) 189(H) 202(H)  BUN 8 - 23 mg/dL 18 26(H) 41(H)  Creatinine 0.61 - 1.24 mg/dL 1.05 1.00 1.17  Sodium 135 - 145 mmol/L 136 139 142  Potassium 3.5 - 5.1 mmol/L 4.7 4.3 5.7(H)  Chloride 98 - 111 mmol/L 106 106 113(H)  CO2 22 - 32 mmol/L _0 Calcium 8.9 - 10.3 mg/dL 10.3  10.7(H) 11.5(H)  Total Protein 6.5 - 8.1 g/dL 6.6 6.5 6.6  Total Bilirubin 0.3 - 1.2 mg/dL 0.7 0.7 0.8  Alkaline Phos 38 - 126 U/L 63 68 58  AST 15 - 41 U/L 13(L) 17 15  ALT 0 - 44 U/L 14 33 18       DIAGNOSTIC IMAGING:  I have reviewed scans.     ASSESSMENT & PLAN:   Prostate cancer ([place]) 1 metastatic prostate cancer to the bones: -Abiraterone and prednisone started in October 2016. -  Bone scan on Aug 27, 2017 shows interval improvement in the T12 lesions.  No new lesions. -Abiraterone dose reduced 100 mg daily and prednisone 2.5 mg daily on February 10, 2018. -Abiraterone was held for a month but his symptoms of abdominal and leg pains did not improve.  His energy levels also did not improve. -He was started back on abiraterone 500 mg daily on August 09, 2019 along with prednisone. -We have talked about continuing therapy and best supportive care.  We will enroll him in palliative care.  He would still like to continue therapy for his metastatic cancer. -For better tolerability, we will decrease the dose of Zytiga to 250 mg daily. -He will receive Lupron injection today.  His PSA today is still undetectable.  We will reevaluate him in 1 month.  2.  Hypercalcemia: -His calcium today is 10.3.  He will continue to hold off on calcium supplements.  3.  IgM MGUS: -SPEP on January 25, 2019 shows M spike 0.4 g.  Light chain ratio is 0.91.  4.  Bone metastasis: -He will receive denosumab today and monthly.     Orders placed this encounter:  No orders of the defined types were placed in this encounter.     [name], MD [place] [phone]

## 2019-08-24 NOTE — Progress Notes (Signed)
Established Patient Office Visit  Subjective:  Patient ID: [name], male    DOB: 04-Oct-1923  Age: 86 y.o. MRN: [id number] Cardiology-2/21 Diagnostic Studies  01/2018 echo Study Conclusions  - Left ventricle: The cavity size was normal. Wall thickness was increased in a pattern of mild LVH. Systolic function was normal. The estimated ejection fraction was in the range of 55% to 60%. Wall motion was normal; there were no regional wall motion abnormalities. The study was not technically sufficient to allow evaluation of LV diastolic dysfunction due to atrial fibrillation. - Aortic valve: Moderately calcified annulus. Trileaflet; mildly thickened, mildly calcified leaflets. Morphologically, there was at least mild calcific aortic stenosis if not mild to moderate stenosis. - Mitral valve: There was mild regurgitation. - Inferior vena cava: The vessel was dilated. The respirophasic diameter changes were blunted (<50%), consistent with elevated central venous pressure. Estimated CVP 15 mmHg.   Assessment and Plan  1. Afib  -no symptoms, continue current meds  2. HTN  - at goal, ciontinue current meds  3. Aortic stenosis - mild, we will continue to monitor.    F/u84month  Prostate cancer (HCC)-08/09/19 1.  Metastatic prostate cancer to bones: -Abiraterone and prednisone started in October 2016. -Bone scan on 08/27/2017 shows interval improvement in the T12 lesion.  No new lesions. -Abiraterone dose reduced to 500 mg daily and prednisone 2.5 mg daily on 02/10/2018. -I have held his Abiraterone as he complained of pain in the abdomen and legs on 07/21/2019.  He reported no improvement in the pains.  He reports that they are his baseline pains. -Overall his energy levels are slightly better.  We will start him back on abiraterone 100 mg daily and prednisone 2.5 mg daily.  I have reviewed his labs.  His PSA is undetectable.  Last Lupron was  03/08/2019. -We will reevaluate him in 2 weeks.  2.  Hypercalcemia: -He received IV fluids for high calcium at last visit.  He also received denosumab. -His calcium today is 10.7.  We will closely monitor it in 2 weeks.  3.  IgM MGUS: -SPEP on 01/25/2019 shows M spike 0.4 g.  Light chain ratio is 0.91.  4.  Bone metastasis: -He is receiving denosumab every 4 weeks.  Last injection was on 07/18/2019.  07/25/2019-wound care This is a 86year old man that we cared for earlier this year in the clinic without a wound on the tip of his right great toe under a very mycotic toenail. He is a type II diabetic.84 His ABIs have been noncompressible however he has been felt to have adequate blood flow in the right to heal the wound. We were able to get this wound to close over as I understand things he followed with Dr. MCaprice Beaverhad some debridement of the nail everything looks fine and apparently the wound bed was washed off by his wife over the weekend and by Monday it was open. His daughter is able to show me pictures. Unfortunately this now has exposed bone raising the possibility that he has had underlying osteomyelitis all along. He has been using Silvadene cream on this he has been referred back to the clinic by Dr. MCaprice Beaver He had x-rays of the right foot that did not show evidence of osteomyelitis in the right foot. He has not been on antibiotics He also had an x-ray of the left foot that showed findings suspicious for osteomyelitis involving the residual base of the first proximal phalanx and the proximal phalanx of the  left second toe as well as the left second metatarsal head however he has no wounds in this area. 4/12; culture from last time of bone in the right great toe was negative. The patient's wife apparently scrubbed this toe vigorously and according to his daughter reopen the wound however unfortunately it is all exposed bone last time. I removed some of this and we use Prisma  unfortunately the area does not look much better today. Previous x-rays done in podiatry office Dr. Caprice Beaver did not show evidence of osteomyelitis. Nevertheless I think osteomyelitis here is probably quite likely. Still using Prisma 4/26; he took 2 weeks of doxycycline although his daughter is telling me he is not eating. He has loose bowel movements however I did not get the sense that this is changed all that much. He still has exposed bone. Culture I did last time showed a few coag negative staph I do not think that this is necessarily relevant. I still am operating under the premise that the patient probably has osteomyelitis. After considerable difficulty we are able to determine he is not allergic to cephalosporins I therefore gave him Keflex. Objective Constitutional Sitting or standing Blood Pressure is within target range for patient.. Pulse regular and within target range for patient.Marland Kitchen Respirations regular, non-labored and within target range.. Temperature is normal and within the target range for the patient.Marland Kitchen Appears in no distress. Vitals Time Taken: 3:35 PM, Height: 70 in, Weight: 140 lbs, BMI: 20.1, Temperature: 97.6 F, Pulse: 63 bpm, Respiratory Rate: 20 breaths/min, Blood Pressure: 112/62 mmHg. General Notes: Wound exam; difficult wound on the tip of the right first toe. Again exposed bone without much granulation. I attempted to remove as much of this as possible using a #3 curette this is to try and allow for some room for progressive granulation however I do not know how far I got with this. There is no obvious surrounding soft tissue infection Integumentary (Hair, Skin) Wound #2 status is Open. Original cause of wound was Gradually Appeared. The wound is located on the Right Toe Great. The wound measures 0.2cm length x 0.4cm width x 0.1cm depth; 0.063cm^2 area and 0.006cm^3 volume. There is bone and Fat Layer (Subcutaneous Tissue) Exposed exposed. There is no tunneling  or undermining noted. There is a none present amount of drainage noted. The wound margin is distinct with the outline attached to the wound base. There is no granulation within the wound bed. There is no necrotic tissue within the wound bed. Assessment Active Problems ICD-10 Type 2 diabetes mellitus with foot ulcer Non-pressure chronic ulcer of other part of right foot with necrosis of bone Type 2 diabetes mellitus with diabetic neuropathy, unspecified Procedures Wound #2 Pre-procedure diagnosis of Wound #2 is a Diabetic Wound/Ulcer of the Lower Extremity located on the Right Toe Great .Severity of Tissue Pre Debridement is: Fat layer exposed. There was a Excisional Skin/Subcutaneous Tissue/Muscle/Bone Debridement with a total area of 0.08 sq cm performed by Ricard Dillon., MD.  CC:  Chief Complaint  Patient presents with  . Diabetes    f/u  . Dementia    concerns with ? hospice    HPI [name] presents for DM-Dementia-family with concern for hospice vs palative care -seeing oncology for treatment next week. Pt with decrease in appetite. No pain noted.  Concern for hypoglycemia due to glipizide and limited food intake. No choking. No difficulty swallowing-not hungry. Concern for ear wax. Pt in wound care with discussion about amputation of toe  due to ulceration-antibiotics currently-keflex   Past Medical History:  Diagnosis Date  . Acute bronchitis   . Acute lower UTI 11/25/2017  . Amputation of toe of left foot ([place]) 07/30/2017, 08/27/2017    Great toe 07/30/2017 Second toe 08/27/2017   . Arthritis   . Aspiration pneumonia ([place]) 01/31/2018  . Atrial fibrillation ([place])    Dr. Harl Bowie- [facility] follows saw 11'14  . Bladder stones    tx. with oral meds and antibiotics, now surgery planned  . Bone metastases ([place]) 08/21/2015  . Cataracts, both eyes    surgery planned May 2015  . Community acquired pneumonia 04/16/2018  . Cystoid macular edema 04/21/2015    Right eye  04/21/2015   . Dehydration 04/16/2018  . Dementia without behavioral disturbance ([place]) 04/16/2018  . Diabetes mellitus without complication ([place])    Type II  . Diarrhea 11/25/2017  . Dyspnea    with activity  . Dysrhythmia    Afib  . Family history of breast cancer   . Family history of colon cancer   . Genetic testing 02/09/2017  . GERD (gastroesophageal reflux disease)   . 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 Margot Chimes, thus far without recurrence.   Marland Kitchen History of MRI of lumbar spine 03/05/2014  . Hypertension   . Hypertrophy of prostate with urinary obstruction   . Hypomagnesemia 03/16/2017  . Metabolic encephalopathy 04/28/3233  . Metastatic adenocarcinoma to prostate ([place]) 09/08/2013  . 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 NHL (non-Hodgkin's lymphoma) ([place]) 2005   Diffuse large B-cell lymphoma, clinically stage IIIA, CD20 positive, status post cervical lymph node biopsy 08/03/2003 on the left. PET scan was also positive in the spleen and small bowel region, but bone marrow aspiration and biopsy were negative. So he essentially had stage IIIAs. He received R-CHOP x6 cycles with CR established by PET scan criteria on 11/23/2003 with no evidence for relapse th  . Numbness 02/19/2017   Per [facility] new patient packet  . Osteomyelitis ([place]) 06/29/2017   toe of left foot   . PAD (peripheral artery disease) ([place]) 08/27/2017  . Polyneuropathy 02/19/2017  . Postural dizziness with presyncope 02/19/2017  . S/P TURP 08/10/2013  . Sepsis ([place]) 07/02/2018  . Skin cancer    Basal cell- face,head, neck,  arms, legs, Back  . Urinary frequency 02/19/2017    Past Surgical History:  Procedure Laterality Date  . AMPUTATION Left 06/29/2017    Procedure: AMPUTATION LEFT GREAT TOE;  Surgeon: [name], MD;  Location: [facility];  Service: Vascular;  Laterality: Left;  . AMPUTATION Left 08/27/2017   Procedure: AMPUTATION Left SECOND TOE;  Surgeon: [name], MD;  Location: [facility];  Service: Vascular;  Laterality: Left;  . CATARACT EXTRACTION Bilateral 2015  . CHOLECYSTECTOMY    . COLON RESECTION     small bowel  . CYSTOSCOPY WITH LITHOLAPAXY N/A 08/10/2013   Procedure: CYSTOSCOPY WITH LITHOLAPAXY WITH [name];  Surgeon: [name], MD;  Location[facility];  Service: Urology;  Laterality: N/A;  . ESOPHAGEAL DILATION N/A 02/27/2016   Procedure: ESOPHAGEAL DILATION;  Surgeon: [name], MD;  [facility];  Service: Endoscopy;  Laterality: N/A;  . ESOPHAGEAL DILATION N/A 07/07/2018   Procedure: ESOPHAGEAL DILATION;  Surgeon: [name],  MD;  [facility];  Service: Endoscopy;  Laterality: N/A;  . ESOPHAGOGASTRODUODENOSCOPY N/A 02/27/2016   Procedure: ESOPHAGOGASTRODUODENOSCOPY (EGD);  Surgeon: [name], MD;  [facility];  Service: Endoscopy;  Laterality: N/A;  12:00  . ESOPHAGOGASTRODUODENOSCOPY N/A 07/07/2018   Procedure: ESOPHAGOGASTRODUODENOSCOPY (EGD);  Surgeon: [name], MD;  [facility];  Service: Endoscopy;  Laterality: N/A;  . INCISION AND DRAINAGE PERIRECTAL ABSCESS    . LOWER EXTREMITY ANGIOGRAPHY N/A 06/25/2017   Procedure: LOWER EXTREMITY ANGIOGRAPHY;  Surgeon: [name], MD;  Location: [facility];  Service: Cardiovascular;  Laterality: N/A;  . LYMPH NODE DISSECTION Left    '05-neck  . PORT-A-CATH REMOVAL    . PORTACATH PLACEMENT     insertion and removal -last chemotherapy 10 yrs ago  . TRANSURETHRAL RESECTION OF PROSTATE N/A 08/10/2013   Procedure: TRANSURETHRAL RESECTION OF THE PROSTATE WITH GYRUS INSTRUMENTS;  Surgeon: [name], MD;  [facility];  Service: Urology;  Laterality: N/A;    Family History   Problem Relation Age of Onset  . Heart attack Mother        died in her 48s  . Other Father 7       pneumonia - died  . Breast cancer Sister        dx in her 22s-60s  . Colon cancer Brother        dx in his 30s  . Cancer Sister        TAH/BSO due to "male cancer"  . Breast cancer Daughter 93  . Breast cancer Daughter 61       DCIS - ATM VUS on [employer]ae 83 gene panel in 2018  . Breast cancer Daughter 66       DCIS - Negative on My[employer] MyRisk 25 gene apnel in 2015  . Thyroid cancer Daughter 53       Medullary and Papillary  . Thyroid nodules Grandchild     Social History   Socioeconomic History  . Marital status: Married    Spouse name: Not on file  . Number of children: Not on file  . Years of education: Not on file  . Highest education level: Not on file  Occupational History  . Occupation: retired    Fish farm manager: RETIRED    Comment: salesman  Tobacco Use  . Smoking status: Never Smoker  . Smokeless tobacco: Never Used  Substance and Sexual Activity  . Alcohol use: No  . Drug use: No  . Sexual activity: Not Currently  Other Topics Concern  . Not on file  Social History Narrative   Diet: Soft foods      Do you drink/eat things with caffeine No      Marital Status: Marred   What year were you married? 1949      Do you live in a house, apartment, assisted living, condo, trailer, etc.? House      Is it one or more stories? One      How many persons live in your home? 2         Do you have any pets in your home?(please list) No      Highest level of education completed: 9th      Current or past profession: [employer] you exercise? No Type and how often:      Living Will? Yes      DNR form? NO  If not do you wish  to discuss one? Yes      POA/HPOA forms? Yes      Difficulty bathing or dressing yourself? Yes      Difficulty preparing food or eating? Yes      Difficulty managing medications? Yes      Difficulty managing your finances?  Yes      Difficulty affording your medications? No                     Social Determinants of Health   Financial Resource Strain:   . Difficulty of Paying Living Expenses:   Food Insecurity:   . Worried About Charity fundraiser in the Last Year:   . Arboriculturist in the Last Year:   Transportation Needs:   . Film/video editor (Medical):   Marland Kitchen Lack of Transportation (Non-Medical):   Physical Activity:   . Days of Exercise per Week:   . Minutes of Exercise per Session:   Stress:   . Feeling of Stress :   Social Connections:   . Frequency of Communication with Friends and Family:   . Frequency of Social Gatherings with Friends and Family:   . Attends Religious Services:   . Active Member of Clubs or Organizations:   . Attends Archivist Meetings:   Marland Kitchen Marital Status:   Intimate Partner Violence:   . Fear of Current or Ex-Partner:   . Emotionally Abused:   Marland Kitchen Physically Abused:   . Sexually Abused:     Outpatient Medications Prior to Visit  Medication Sig Dispense Refill  . abiraterone acetate (ZYTIGA) 250 MG tablet Take 500 mg by mouth daily. Take on an empty stomach 1 hour before or 2 hours after a meal    . acetaminophen (TYLENOL) 500 MG tablet Take 500-1,000 mg by mouth every 8 (eight) hours as needed for mild pain, fever or headache.     Marland Kitchen apixaban (ELIQUIS) 5 MG TABS tablet Take 5 mg by mouth 2 (two) times daily.    . blood glucose meter kit and supplies Dispense based on patient and insurance preference. Use four times daily as directed. (FOR ICD-10 E10.9, E11.9). 1 each 0  . Calcium Polycarbophil (FIBER-CAPS PO) Take 1 capsule by mouth 2 (two) times daily.    . Cholecalciferol (VITAMIN D3) 1000 units CAPS Take 1,000 Units by mouth every morning.     . diphenhydrAMINE-APAP, sleep, (TYLENOL PM EXTRA STRENGTH PO) Take 1 tablet by mouth at bedtime.     . donepezil (ARICEPT) 10 MG tablet Take 1 tablet by mouth once daily 90 tablet 0  . feeding supplement,  ENSURE ENLIVE, (ENSURE ENLIVE) LIQD Take 237 mLs by mouth daily. CHOCOLATE     . fluticasone (FLONASE) 50 MCG/ACT nasal spray Place 2 sprays into both nostrils daily.    . furosemide (LASIX) 20 MG tablet Take 20 mg by mouth daily. Taking PRN    . glipiZIDE (GLUCOTROL XL) 5 MG 24 hr tablet Take 5 mg by mouth daily with breakfast.     . Lancets 28G MISC 1 kit by Does not apply route every morning. 100 each 1  . leuprolide (LUPRON) 11.25 MG KIT injection Inject into the muscle.    . lisinopril (ZESTRIL) 20 MG tablet Take 1 tablet by mouth once daily 90 tablet 3  . metFORMIN (GLUCOPHAGE) 850 MG tablet Take 850 mg by mouth 2 (two) times daily with a meal.     . mirtazapine (REMERON) 15 MG tablet Take  1 tablet (15 mg total) by mouth at bedtime. 30 tablet 0  . Multiple Vitamin (MULTIVITAMIN WITH MINERALS) TABS tablet Take 1 tablet by mouth every morning.    Marland Kitchen omeprazole (PRILOSEC) 40 MG capsule Take 40 mg by mouth every other day.   3  . predniSONE (DELTASONE) 5 MG tablet Take 1/2 (one-half) tablet by mouth twice daily 90 tablet 0  . trimethoprim (TRIMPEX) 100 MG tablet Take 100 mg by mouth daily.     . cephALEXin (KEFLEX) 500 MG capsule     . Cranberry-Vitamin C-Vitamin E (CRANBERRY PLUS VITAMIN C) 4200-20-3 MG-MG-UNIT CAPS Take 1 capsule by mouth every morning.    . diltiazem (CARDIZEM) 60 MG tablet Take 1 tablet by mouth twice daily 180 tablet 1  . glucose blood (EASYMAX TEST) test strip Use as instructed 100 each 12  . Potassium Chloride (KLOR-CON 10 PO) Take 10 mg by mouth 2 (two) times daily.    Marland Kitchen doxycycline (MONODOX) 100 MG capsule      No facility-administered medications prior to visit.    Allergies  Allergen Reactions  . Tape Other (See Comments)    SKIN IS VERY THIN AND WILL TEAR AND BRUISE VERY EASILY!!!!  . Augmentin [Amoxicillin-Pot Clavulanate] Rash and Other (See Comments)    Redness of skin    ROS Review of Systems  Constitutional: Positive for fatigue and unexpected  weight change. Negative for fever.       Weight loss-little appetite  Respiratory: Negative.  Negative for cough and shortness of breath.   Cardiovascular: Negative.  Negative for chest pain, palpitations and leg swelling.       No CP  Endocrine:       DM  Musculoskeletal:       Wound -foot ulcer      Objective:    Physical Exam  BP 116/69 (BP Location: Left Arm, Patient Position: Sitting)   Pulse 77   Temp 98.4 F (36.9 C) (Temporal)   Wt 139 lb 6.4 oz (63.2 kg)   SpO2 96%   BMI 20.00 kg/m  Wt Readings from Last 3 Encounters:  08/24/19 140 lb 6 oz (63.7 kg)  08/23/19 139 lb 6.4 oz (63.2 kg)  08/09/19 143 lb 1.6 oz (64.9 kg)     Health Maintenance Due  Topic Date Due  . FOOT EXAM  Never done  . OPHTHALMOLOGY EXAM  Never done  . PNA vac Low Risk Adult (2 of 2 - PCV13) 01/23/2015    Lab Results  Component Value Date   TSH 3.749 04/17/2018   Lab Results  Component Value Date   WBC 8.5 08/24/2019   HGB 11.7 (L) 08/24/2019   HCT 34.3 (L) 08/24/2019   MCV 95.5 08/24/2019   PLT 205 08/24/2019   Lab Results  Component Value Date   NA 136 08/24/2019   K 4.7 08/24/2019   CO2 24 08/24/2019   GLUCOSE 148 (H) 08/24/2019   BUN 18 08/24/2019   CREATININE 1.05 08/24/2019   BILITOT 0.7 08/24/2019   ALKPHOS 63 08/24/2019   AST 13 (L) 08/24/2019   ALT 14 08/24/2019   PROT 6.6 08/24/2019   ALBUMIN 3.7 08/24/2019   CALCIUM 10.3 08/24/2019   ANIONGAP 6 08/24/2019   Lab Results  Component Value Date   CHOL 147 01/31/2018   Lab Results  Component Value Date   HDL 50 01/31/2018   Lab Results  Component Value Date   LDLCALC 75 01/31/2018   Lab Results  Component Value Date  TRIG 109 01/31/2018   Lab Results  Component Value Date   CHOLHDL 2.9 01/31/2018   Lab Results  Component Value Date   HGBA1C 5.6 02/27/2019      Assessment & Plan:  1. Type 2 diabetes mellitus with diabetic neuropathy, without long-term current use of insulin ([place]) Renal  function normal-pt should not take metformin if not eating. Continue to monitor glucose fasting  2. Essential hypertension Well controlled-continue zestril/lasix  3. Dementia without behavioral disturbance, unspecified dementia type ([place]) Aricept-d/w pt with family present DNR-pt stated" if anything could be done, I wanted it done" Pt did not reject options of  CPR or intubation    4. Cerumen debris on tympanic membrane of both ears Cleared manually and with water extraction Problem List Items Addressed This Visit      Endocrine   Type 2 diabetes mellitus ([place]) - Primary      Meds ordered this encounter  Medications  . DISCONTD: glucose blood (EASYMAX TEST) test strip    Sig: Use as instructed    Dispense:  100 each    Refill:  12    Patient to test 2-3 times daily due to poor nutritional intake, hypoglycemia    Follow-up: oncology for discussion about hospice-discussed with family oncology would need to make decision on treatment options and outcome potential using current treatment model.  Would recommend hospice to support pt and family when oncology feels additional options do not provide curative potential.   Continue wound care for options related to infection 1 hour spent with family and patient discussing specialty care, history since last visit, ongoing patient safety, nutrition, physical exam , assessment and plan  Gaege Sangalang Hannah Beat, MD

## 2019-08-24 NOTE — Patient Instructions (Addendum)
[place] at [facility] Discharge Instructions  You were seen today by Dr. Delton Coombes. He went over your recent lab results. Cut back on your Zytiga to one tablet a day. He will see you back in 1 month for labs and follow up.   Thank you for choosing [place] at [facility] to provide your oncology and hematology care.  To afford each patient quality time with our provider, please arrive at least 15 minutes before your scheduled appointment time.   If you have a lab appointment with the [facility] please come in thru the  Main Entrance and check in at the main information desk  You need to re-schedule your appointment should you arrive 10 or more minutes late.  We strive to give you quality time with our providers, and arriving late affects you and other patients whose appointments are after yours.  Also, if you no show three or more times for appointments you may be dismissed from the clinic at the providers discretion.     Again, thank you for choosing [facility].  Our hope is that these requests will decrease the amount of time that you wait before being seen by our physicians.       _____________________________________________________________  Should you have questions after your visit to [facility], please contact our office at [phone] between the hours of 8:00 a.m. and 4:30 p.m.  Voicemails left after 4:00 p.m. will not be returned until the following business day.  For prescription refill requests, have your pharmacy contact our office and allow 72 hours.    Cancer Center Support Programs:   > Cancer Support Group  2nd Tuesday of the month 1pm-2pm, Journey Room

## 2019-08-25 ENCOUNTER — Ambulatory Visit (INDEPENDENT_AMBULATORY_CARE_PROVIDER_SITE_OTHER): Payer: Medicare Other | Admitting: Nurse Practitioner

## 2019-08-25 ENCOUNTER — Other Ambulatory Visit: Payer: Self-pay

## 2019-08-25 ENCOUNTER — Encounter: Payer: Self-pay | Admitting: Nurse Practitioner

## 2019-08-25 ENCOUNTER — Other Ambulatory Visit: Payer: Self-pay | Admitting: Family Medicine

## 2019-08-25 VITALS — BP 118/62 | HR 52 | Temp 96.6°F | Ht 70.0 in | Wt 140.0 lb

## 2019-08-25 DIAGNOSIS — N390 Urinary tract infection, site not specified: Secondary | ICD-10-CM

## 2019-08-25 DIAGNOSIS — E1169 Type 2 diabetes mellitus with other specified complication: Secondary | ICD-10-CM | POA: Diagnosis not present

## 2019-08-25 DIAGNOSIS — F0390 Unspecified dementia without behavioral disturbance: Secondary | ICD-10-CM

## 2019-08-25 DIAGNOSIS — H9193 Unspecified hearing loss, bilateral: Secondary | ICD-10-CM

## 2019-08-25 DIAGNOSIS — R269 Unspecified abnormalities of gait and mobility: Secondary | ICD-10-CM | POA: Diagnosis not present

## 2019-08-25 DIAGNOSIS — Z7189 Other specified counseling: Secondary | ICD-10-CM | POA: Diagnosis not present

## 2019-08-25 DIAGNOSIS — D472 Monoclonal gammopathy: Secondary | ICD-10-CM | POA: Diagnosis not present

## 2019-08-25 DIAGNOSIS — I480 Paroxysmal atrial fibrillation: Secondary | ICD-10-CM

## 2019-08-25 DIAGNOSIS — G63 Polyneuropathy in diseases classified elsewhere: Secondary | ICD-10-CM

## 2019-08-25 DIAGNOSIS — L97519 Non-pressure chronic ulcer of other part of right foot with unspecified severity: Secondary | ICD-10-CM | POA: Diagnosis not present

## 2019-08-25 DIAGNOSIS — C7951 Secondary malignant neoplasm of bone: Secondary | ICD-10-CM | POA: Diagnosis not present

## 2019-08-25 DIAGNOSIS — I10 Essential (primary) hypertension: Secondary | ICD-10-CM

## 2019-08-25 DIAGNOSIS — R1319 Other dysphagia: Secondary | ICD-10-CM

## 2019-08-25 DIAGNOSIS — C8581 Other specified types of non-Hodgkin lymphoma, lymph nodes of head, face, and neck: Secondary | ICD-10-CM | POA: Diagnosis not present

## 2019-08-25 DIAGNOSIS — R413 Other amnesia: Secondary | ICD-10-CM

## 2019-08-25 DIAGNOSIS — C61 Malignant neoplasm of prostate: Secondary | ICD-10-CM

## 2019-08-25 DIAGNOSIS — E1165 Type 2 diabetes mellitus with hyperglycemia: Secondary | ICD-10-CM | POA: Diagnosis not present

## 2019-08-25 NOTE — Assessment & Plan Note (Signed)
1 metastatic prostate cancer to the bones: -Abiraterone and prednisone started in October 2016. -Bone scan on Aug 27, 2017 shows interval improvement in the T12 lesions.  No new lesions. -Abiraterone dose reduced 100 mg daily and prednisone 2.5 mg daily on February 10, 2018. -Abiraterone was held for a month but his symptoms of abdominal and leg pains did not improve.  His energy levels also did not improve. -He was started back on abiraterone 500 mg daily on August 09, 2019 along with prednisone. -We have talked about continuing therapy and best supportive care.  We will enroll him in palliative care.  He would still like to continue therapy for his metastatic cancer. -For better tolerability, we will decrease the dose of Zytiga to 250 mg daily. -He will receive Lupron injection today.  His PSA today is still undetectable.  We will reevaluate him in 1 month.  2.  Hypercalcemia: -His calcium today is 10.3.  He will continue to hold off on calcium supplements.  3.  IgM MGUS: -SPEP on January 25, 2019 shows M spike 0.4 g.  Light chain ratio is 0.91.  4.  Bone metastasis: -He will receive denosumab today and monthly.

## 2019-08-25 NOTE — Progress Notes (Signed)
Careteam: Patient Care Team: [name], NP as PCP - General (Geriatric Medicine) [name], MD as PCP - Cardiology (Cardiology) [name], MD as Consulting Physician (Urology) [name], MD (Inactive) as Consulting Physician (Hematology and Oncology) [name], MD as Consulting Physician (Orthopedic Surgery) [name], DPM as Consulting Physician (Podiatry) [name], MD as Consulting Physician (Hematology) [name], MD (Dermatology) [name], MD as Consulting Physician (Gastroenterology) [name], MD as Consulting Physician (Vascular Surgery) Sater, [name], MD (Neurology) Clinic, [name], [name] (Ophthalmology)  PLACE OF SERVICE:  [place]  Advanced Directive information    Allergies  Allergen Reactions  . Tape Other (See Comments)    SKIN IS VERY THIN AND WILL TEAR AND BRUISE VERY EASILY!!!!  . Augmentin [Amoxicillin-Pot Clavulanate] Rash and Other (See Comments)    Redness of skin    Chief Complaint  Patient presents with  . Establish Care    New Patient, establish care. Here with daughter [name]. Lives with spouse.     HPI: Patient is a 86 y.o. male to establish care. Current PCP is retiring and [facility] was recommended.   Seeing several specialist  Weight loss and mood- on mirtazapine 15 mg (previously on 7.5 mg and recently increased to see if that would improve appetite)   Dementia- caregiver Monday- Friday and family lives close.   OA- not needing medication, uses wheelchair and walker. Lives at home with wife.   neuropathy after chemo for his non-hodkin lymphoma, continues to follow up with cancer doctor due to prostate cancer- had mets in stereum, ribs and vertebrae, he has been placed on zytiga- PSA is 0, he was reduced from 2 pills daily to 1 pill a day. Also on prednisone 2.5 mg daily with lupron.   Chronic UTI- on trimethoprim- no UTIs since he  has been on and cranberry   DM- well controlled, last A1c of 5.6, fasting blood sugar 89-138 (203) this morning.  Generally not in the 200s.   Daily he has ensure "milkshake" he has a poor appetite generally. Daughter came in from out of town and has gotten him out of the house.   GERD- omeprazole every day, sleeps with a 3 inch elevation that has helped symptoms.  Dysphagia- 3 aspiration pneumonias in 2019, has adjusted how and what he eats (texture), also with 3 inch elevation.    A fib- diltiazem and eliqus for anticoagulation.  htn- lisinopril 20 mg daily with diltiazem 60 mg BID  He has been off lasix for 1 week- and now off potassium and calcium supplement.   Very HOH, previous PCP did wash ears but was not able to get all out.   Wound on right big toe- bone exposure and now on keflex (previously on doxycycline)- seeing wound care center for this- Dr Dellia Nims following.  Does not look like healing.  Home health coming out to do wound care changes.  Stage 1 ulcer to sacrum- having to encourage him to change positions.    Review of Systems:  Review of Systems  Constitutional: Positive for malaise/fatigue and weight loss. Negative for chills and fever.  HENT: Positive for hearing loss.   Respiratory: Negative for cough, shortness of breath and wheezing.   Cardiovascular: Negative for chest pain, palpitations and leg swelling.  Gastrointestinal: Negative for constipation, diarrhea and heartburn.  Musculoskeletal: Positive for joint pain. Negative for back pain and myalgias.  Skin:       Chronic  wound to right big toe  Neurological: Positive for tremors and weakness. Negative for dizziness and headaches.  Endo/Heme/Allergies: Positive for environmental allergies. Bruises/bleeds easily.  Psychiatric/Behavioral: Positive for memory loss. The patient is nervous/anxious.     Past Medical History:  Diagnosis Date  . Acute bronchitis   . Acute lower UTI 11/25/2017  . Amputation  of toe of left foot ([place]) 07/30/2017, 08/27/2017    Great toe 07/30/2017 Second toe 08/27/2017   . Arthritis   . Aspiration pneumonia ([place]) 01/31/2018  . Atrial fibrillation ([place])    Dr. Harl Bowie- [facility] follows saw 11'14  . Bladder stones    tx. with oral meds and antibiotics, now surgery planned  . Bone metastases ([place]) 08/21/2015  . Cataracts, both eyes    surgery planned May 2015  . Community acquired pneumonia 04/16/2018  . Cystoid macular edema 04/21/2015    Right eye 04/21/2015   . Dehydration 04/16/2018  . Dementia without behavioral disturbance ([place]) 04/16/2018  . Diabetes mellitus without complication ([place])    Type II  . Diarrhea 11/25/2017  . Dyspnea    with activity  . Dysrhythmia    Afib  . Family history of breast cancer   . Family history of colon cancer   . Genetic testing 02/09/2017  . GERD (gastroesophageal reflux disease)   . 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 Margot Chimes, thus far without recurrence.   Marland Kitchen History of MRI of lumbar spine 03/05/2014  . Hypertension   . Hypertrophy of prostate with urinary obstruction   . Hypomagnesemia 03/16/2017  . Metabolic encephalopathy 40/98/1191  . Metastatic adenocarcinoma to prostate ([place]) 09/08/2013  . 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 NHL (non-Hodgkin's lymphoma) ([place]) 2005   Diffuse large B-cell lymphoma, clinically stage IIIA, CD20 positive, status post cervical lymph node biopsy 08/03/2003 on the left. PET scan was also positive in the spleen and small bowel region, but bone marrow aspiration and biopsy were negative. So he essentially had stage IIIAs. He received R-CHOP x6 cycles with CR established by PET scan criteria on 11/23/2003 with no evidence for relapse  th  . Numbness 02/19/2017   Per [facility] new patient packet  . Osteomyelitis ([place]) 06/29/2017   toe of left foot   . PAD (peripheral artery disease) ([place]) 08/27/2017  . Polyneuropathy 02/19/2017  . Postural dizziness with presyncope 02/19/2017  . S/P TURP 08/10/2013  . Sepsis ([place]) 07/02/2018  . Skin cancer    Basal cell- face,head, neck,  arms, legs, Back  . Urinary frequency 02/19/2017   Past Surgical History:  Procedure Laterality Date  . AMPUTATION Left 06/29/2017   Procedure: AMPUTATION LEFT GREAT TOE;  Surgeon: [name], MD;  Location: [facility];  Service: Vascular;  Laterality: Left;  . AMPUTATION Left 08/27/2017   Procedure: AMPUTATION Left SECOND TOE;  Surgeon: [name], MD;  Location: [facility];  Service: Vascular;  Laterality: Left;  . CATARACT EXTRACTION Bilateral 2015  . CHOLECYSTECTOMY    . COLON RESECTION     small bowel  . CYSTOSCOPY WITH LITHOLAPAXY N/A 08/10/2013   Procedure: CYSTOSCOPY WITH LITHOLAPAXY WITH [name];  Surgeon: [name], MD;  Location: [facility];  Service: Urology;  Laterality: N/A;  . ESOPHAGEAL DILATION N/A 02/27/2016   Procedure: ESOPHAGEAL DILATION;  Surgeon: [name], MD;  [facility];  Service: Endoscopy;  Laterality: N/A;  . ESOPHAGEAL DILATION N/A 07/07/2018   Procedure: ESOPHAGEAL DILATION;  Surgeon: [name], MD;  [facility];  Service: Endoscopy;  Laterality: N/A;  . ESOPHAGOGASTRODUODENOSCOPY N/A 02/27/2016   Procedure: ESOPHAGOGASTRODUODENOSCOPY (EGD);  Surgeon: [name], MD;  [facility];  Service: Endoscopy;  Laterality: N/A;  12:00  . ESOPHAGOGASTRODUODENOSCOPY N/A 07/07/2018   Procedure: ESOPHAGOGASTRODUODENOSCOPY (EGD);  Surgeon: [name], MD;  [facility];  Service: Endoscopy;  Laterality: N/A;  . INCISION AND DRAINAGE PERIRECTAL ABSCESS    . LOWER EXTREMITY ANGIOGRAPHY N/A 06/25/2017   Procedure: LOWER EXTREMITY ANGIOGRAPHY;  Surgeon: [name], MD;  Location: [facility];  Service: Cardiovascular;  Laterality: N/A;  . LYMPH NODE DISSECTION Left    '05-neck  . PORT-A-CATH REMOVAL    . PORTACATH PLACEMENT     insertion and removal -last chemotherapy 10 yrs ago  . TRANSURETHRAL RESECTION OF PROSTATE N/A 08/10/2013   Procedure: TRANSURETHRAL RESECTION OF THE PROSTATE WITH GYRUS INSTRUMENTS;  Surgeon: [name], MD;  [facility];  Service: Urology;  Laterality: N/A;   Social History:   reports that he has never smoked. He has never used smokeless tobacco. He reports that he does not drink alcohol or use drugs.  Family History  Problem Relation Age of Onset  . Heart attack Mother        died in her 4s  . Other Father 35       pneumonia - died  . Breast cancer Sister        dx in her 4s-60s  . Colon cancer Brother        dx in his 49s  . Cancer Sister        TAH/BSO due to "male cancer"  . Breast cancer Daughter 72  . Breast cancer Daughter 41       DCIS - ATM VUS on [employer] 83 gene panel in 2018  . Breast cancer Daughter 69       DCIS - Negative on [employer] MyRisk 25 gene apnel in 2015  . Thyroid cancer Daughter 47       Medullary and Papillary  . Thyroid nodules Grandchild     Medications: Patient's Medications  New Prescriptions   No medications on file  Previous Medications   ABIRATERONE ACETATE (ZYTIGA) 250 MG TABLET    Take 250 mg by mouth daily. Take on an empty stomach 1 hour before or 2 hours after a meal    ACETAMINOPHEN (TYLENOL) 500 MG TABLET    Take 500-1,000 mg by mouth every 8 (eight) hours as needed for mild pain, fever or headache.    ALBUTEROL (PROVENTIL) (2.5 MG/3ML) 0.083% NEBULIZER SOLUTION    Take 2.5 mg by nebulization every 6 (six) hours as needed for wheezing or shortness of breath.   ALBUTEROL (VENTOLIN HFA) 108 (90 BASE) MCG/ACT INHALER    Inhale into the lungs every 6 (six) hours as needed for wheezing or shortness of breath.   APIXABAN (ELIQUIS) 5 MG TABS  TABLET    Take 5 mg by mouth 2 (two) times daily.   BLOOD GLUCOSE METER KIT AND SUPPLIES    Dispense based on patient and insurance preference. Use four times daily as directed. (FOR ICD-10 E10.9, E11.9).   CALCIUM POLYCARBOPHIL (FIBER-CAPS PO)    3 by mouth in the morning and 2 by mouth  in the evening   CHOLECALCIFEROL (VITAMIN D3) 1000 UNITS CAPS    Take 1,000 Units by mouth every morning.    CLOTRIMAZOLE-BETAMETHASONE (LOTRISONE) CREAM    Apply 1 application topically 2 (two) times daily as needed.    CRANBERRY 500 MG TABS    Take 1 tablet by mouth daily.   DENOSUMAB (XGEVA) 120 MG/1.7ML SOLN INJECTION    Inject 120 mg into the skin every 30 (thirty) days.   DILTIAZEM (CARDIZEM) 60 MG TABLET    Take 60 mg by mouth 2 (two) times daily.   DIPHENHYDRAMINE-APAP, SLEEP, (TYLENOL PM EXTRA STRENGTH PO)    Take 1 tablet by mouth at bedtime.    DONEPEZIL (ARICEPT) 10 MG TABLET    Take 1 tablet by mouth once daily   FEEDING SUPPLEMENT, ENSURE ENLIVE, (ENSURE ENLIVE) LIQD    Take 237 mLs by mouth daily. CHOCOLATE    FLUTICASONE (FLONASE) 50 MCG/ACT NASAL SPRAY    Place 2 sprays into both nostrils as needed.    FUROSEMIDE (LASIX) 20 MG TABLET    Take 20 mg by mouth daily as needed.    GLIPIZIDE (GLUCOTROL XL) 5 MG 24 HR TABLET    Take 5 mg by mouth daily with breakfast.    GLUCOSE BLOOD (EASYMAX TEST) TEST STRIP    Use as instructed   LANCETS 28G MISC    1 kit by Does not apply route every morning.   LEUPROLIDE (LUPRON) 11.25 MG KIT INJECTION    Inject into the muscle every 6 (six) months.    LISINOPRIL (ZESTRIL) 20 MG TABLET    Take 1 tablet by mouth once daily   METFORMIN (GLUCOPHAGE) 850 MG TABLET    Take 850 mg by mouth 2 (two) times daily with a meal.    MIRTAZAPINE (REMERON) 15 MG TABLET    Take 1 tablet (15 mg total) by mouth at bedtime.   MULTIPLE VITAMIN (MULTIVITAMIN WITH MINERALS) TABS TABLET    Take 1 tablet by mouth every morning.   OMEPRAZOLE (PRILOSEC) 40 MG CAPSULE    Take 40 mg by mouth  daily.    PREDNISONE (DELTASONE) 5 MG TABLET    Take 1/2 (one-half) tablet by mouth twice daily   PROBIOTIC PRODUCT (PROBIOTIC DAILY PO)    Take 1 tablet by mouth daily. Every morning   SIMETHICONE 125 MG CAPS    Take 1 capsule by mouth as needed.   TRIMETHOPRIM (TRIMPEX) 100 MG TABLET    Take 100 mg by mouth daily.   Modified Medications   No medications on file  Discontinued Medications   CALCIUM CITRATE-VITAMIN D (CALCIUM CITRATE+D3 PO)    Take 600 mg by mouth daily.   DILTIAZEM (CARDIZEM) 90 MG TABLET    Take 90 mg by mouth in the morning and at bedtime.   POTASSIUM CHLORIDE (KLOR-CON 10 PO)    Take 10 mg by mouth 2 (two) times daily.   SIMETHICONE (MYLICON) 517 MG CHEWABLE TABLET    Chew 125 mg by mouth every 6 (six) hours as needed for flatulence.    Physical Exam:  Vitals:   08/25/19 1326  BP: 118/62  Pulse: (!) 52  Temp: (!) 96.6 F (35.9 C)  TempSrc: Temporal  SpO2: 99%  Weight: 140 lb (63.5 kg)  Height: _0  (1.778 m)   Body mass index is 20.09 kg/m. Wt Readings from Last 3 Encounters:  08/25/19 140 lb (63.5 kg)  08/24/19 140 lb 6 oz (63.7 kg)  08/23/19 139 lb 6.4  oz (63.2 kg)    Physical Exam Constitutional:      General: He is not in acute distress.    Appearance: He is well-developed. He is not diaphoretic.     Comments: Frail elderly man in wheelchair  HENT:     Head: Normocephalic and atraumatic.     Right Ear: External ear normal. There is no impacted cerumen.     Left Ear: External ear normal. There is no impacted cerumen.  Eyes:     Conjunctiva/sclera: Conjunctivae normal.     Pupils: Pupils are equal, round, and reactive to light.  Cardiovascular:     Rate and Rhythm: Normal rate and regular rhythm.     Heart sounds: Normal heart sounds.  Pulmonary:     Effort: Pulmonary effort is normal.     Breath sounds: Normal breath sounds.  Abdominal:     General: Bowel sounds are normal. There is no distension.     Palpations: Abdomen is soft. There  is mass.  Musculoskeletal:        General: No tenderness.     Cervical back: Normal range of motion and neck supple.     Right lower leg: No edema.     Left lower leg: No edema.     Comments: Wearing boot to right foot   Skin:    General: Skin is warm and dry.     Findings: Bruising (scattered) present.  Neurological:     Mental Status: He is alert. Mental status is at baseline.  Psychiatric:        Mood and Affect: Mood normal.        Behavior: Behavior normal.     Labs reviewed: Basic Metabolic Panel: Recent Labs    07/18/19 1024 08/09/19 1100 08/24/19 1003  NA 142 139 136  K 5.7* 4.3 4.7  CL 113* 106 106  CO2 _0 GLUCOSE 202* 189* 148*  BUN 41* 26* 18  CREATININE 1.17 1.00 1.05  CALCIUM 11.5* 10.7* 10.3   Liver Function Tests: Recent Labs    07/18/19 1024 08/09/19 1100 08/24/19 1003  AST 15 17 13*  ALT 18 33 14  ALKPHOS 58 68 63  BILITOT 0.8 0.7 0.7  PROT 6.6 6.5 6.6  ALBUMIN 3.8 3.6 3.7   Recent Labs    01/06/19 1229  LIPASE 22   No results for input(s): AMMONIA in the last 8760 hours. CBC: Recent Labs    07/18/19 1024 08/09/19 1100 08/24/19 1003  WBC 10.2 8.6 8.5  NEUTROABS 5.6 4.3 4.3  HGB 11.9* 11.5* 11.7*  HCT 36.1* 33.6* 34.3*  MCV 97.6 95.7 95.5  PLT 177 175 205   Lipid Panel: No results for input(s): CHOL, HDL, LDLCALC, TRIG, CHOLHDL, LDLDIRECT in the last 8760 hours. TSH: No results for input(s): TSH in the last 8760 hours. A1C: Lab Results  Component Value Date   HGBA1C 5.6 02/27/2019     Assessment/Plan 1. Secondary malignant neoplasm of bone (HCC) -stable, does not report any pain due to bone mets at this time.  2. Neuropathy associated with MGUS (HCC) Stable, noted after chemotherapy.  3. Advance care planning -Most form given for review  - DNR (Do Not Resuscitate)  4. Bilateral hearing loss, unspecified hearing loss type Ongoing, he had wax noted to ears which has now been removed. TM visualized  bilaterally.  5. Type 2 diabetes mellitus with other specified complication, without long-term current use of insulin ([place]) -currently on glipizide with metformin, no hypoglycemia  noted, would like to follow up A1c with next labs (gets with oncologist monthly).  -Encouraged dietary compliance, routine foot care/monitoring and to keep up with diabetic eye exams through ophthalmology   6. Other dysphagia -ongoing with frequent aspiration pneumonias, have raised the head of bed up for GERD and this has helped with aspiration as well. Aware of risk for aspiration Pnemonia.   7. Paroxysmal atrial fibrillation (HCC) Stable, rate controlled on cardizem, continues on eliquis BID for anticoagulation.   8. Essential hypertension -stable, continues on lisinopril and cardizem  9. Prostate cancer [facility]) Followed by urology and oncology, continues on zytia daily   10. Recurrent UTI -no recent UTI, followed by urologist, continues on trimethoprim.   11. Dementia without behavioral disturbance, unspecified dementia type ([place]) Stable, continues on aricept, living with wife and lots of family support.   12. Bone metastases ([place]) -noted, has been stable per report of daughter, no pain noted due to metastases.   Next appt: 3 months Reinette Cuneo K. [place], [facility] [phone]

## 2019-08-28 ENCOUNTER — Encounter (HOSPITAL_BASED_OUTPATIENT_CLINIC_OR_DEPARTMENT_OTHER): Payer: Medicare Other | Attending: Internal Medicine | Admitting: Internal Medicine

## 2019-08-28 ENCOUNTER — Other Ambulatory Visit (HOSPITAL_COMMUNITY): Payer: Self-pay | Admitting: *Deleted

## 2019-08-28 DIAGNOSIS — E11621 Type 2 diabetes mellitus with foot ulcer: Secondary | ICD-10-CM | POA: Diagnosis not present

## 2019-08-28 DIAGNOSIS — Z8546 Personal history of malignant neoplasm of prostate: Secondary | ICD-10-CM | POA: Insufficient documentation

## 2019-08-28 DIAGNOSIS — E1151 Type 2 diabetes mellitus with diabetic peripheral angiopathy without gangrene: Secondary | ICD-10-CM | POA: Insufficient documentation

## 2019-08-28 DIAGNOSIS — C7951 Secondary malignant neoplasm of bone: Secondary | ICD-10-CM | POA: Insufficient documentation

## 2019-08-28 DIAGNOSIS — C61 Malignant neoplasm of prostate: Secondary | ICD-10-CM

## 2019-08-28 DIAGNOSIS — I10 Essential (primary) hypertension: Secondary | ICD-10-CM | POA: Diagnosis not present

## 2019-08-28 DIAGNOSIS — L97514 Non-pressure chronic ulcer of other part of right foot with necrosis of bone: Secondary | ICD-10-CM | POA: Insufficient documentation

## 2019-08-28 DIAGNOSIS — R269 Unspecified abnormalities of gait and mobility: Secondary | ICD-10-CM | POA: Diagnosis not present

## 2019-08-28 DIAGNOSIS — L97519 Non-pressure chronic ulcer of other part of right foot with unspecified severity: Secondary | ICD-10-CM | POA: Diagnosis not present

## 2019-08-28 DIAGNOSIS — F0390 Unspecified dementia without behavioral disturbance: Secondary | ICD-10-CM | POA: Insufficient documentation

## 2019-08-28 DIAGNOSIS — C8581 Other specified types of non-Hodgkin lymphoma, lymph nodes of head, face, and neck: Secondary | ICD-10-CM | POA: Diagnosis not present

## 2019-08-28 DIAGNOSIS — E1165 Type 2 diabetes mellitus with hyperglycemia: Secondary | ICD-10-CM | POA: Diagnosis not present

## 2019-08-28 DIAGNOSIS — Z7901 Long term (current) use of anticoagulants: Secondary | ICD-10-CM | POA: Diagnosis not present

## 2019-08-28 DIAGNOSIS — E1142 Type 2 diabetes mellitus with diabetic polyneuropathy: Secondary | ICD-10-CM | POA: Insufficient documentation

## 2019-08-28 DIAGNOSIS — E1169 Type 2 diabetes mellitus with other specified complication: Secondary | ICD-10-CM | POA: Insufficient documentation

## 2019-08-28 DIAGNOSIS — I4891 Unspecified atrial fibrillation: Secondary | ICD-10-CM | POA: Insufficient documentation

## 2019-08-28 DIAGNOSIS — E1140 Type 2 diabetes mellitus with diabetic neuropathy, unspecified: Secondary | ICD-10-CM | POA: Diagnosis not present

## 2019-08-28 DIAGNOSIS — M869 Osteomyelitis, unspecified: Secondary | ICD-10-CM | POA: Insufficient documentation

## 2019-08-29 NOTE — Progress Notes (Signed)
[name], [name] ([id number]) Visit Report for 08/28/2019 HPI Details Patient Name: Date of Service: [name] 08/28/2019 10:00 A M Medical Record Number: [id number] Patient Account Number: [id number] Date of Birth/Sex: Treating RN: December 25, 1923 (86 y.o. [name] Primary Care Provider: Sherrie Mustache Other Clinician: Referring Provider: Treating Provider/Extender: Elder Love, LISA Weeks in Treatment: 4 History of Present Illness HPI Description: ADMISSION 05/02/2019 This is a 86 year old man accompanied by his daughter. He lives in [place] with his elderly wife. He has home caregivers as well. Apparently on February 28, 2019 they noted blood on his sock and noticed a wound on the tip of his right great toe. He has been cared for by Dr. Posey Pronto who is his podiatrist in [place]. Apparently they have been using Betadine to this area. The patient has a thick mycotic nail on the right as well which is embedded in the wound in some areas. Fortunately the patient has diabetic neuropathy and is not really experiencing any pain otherwise I think this would all be quite painful. The patient was previously treated in 2019 for wounds on the left foot. This included a fairly thorough vascular review by vein and vascular Dr. Oneida Alar. His ABIs at that time were noncompressible bilaterally TBI on the right was 0.88 with biphasic waveforms on the right and monophasic waveforms at the left. He ended up having amputations of the left first and second toes. He did have an angiogram on 06/25/2017 which showed on the right the right common femoral profundofemoral for Morris and superficial femoral arteries were widely patent the right popliteal was widely patent. Tibial vessels were not well opacified secondary to motion artifact however he was felt to have all 3 tibial vessels intact with some calcification and some areas of stenosis within the posterior tibial artery but  relatively good flow to the right lower leg. He also had the left reviewed as well which was the source of the problem at that time. Past medical history; prostate cancer on oral antiandrogen therapy with bone mets, GI stromal tumor in 2005, type 2 diabetes with peripheral neuropathy, hypertension, atrial fibrillation on Eliquis, history of non-Hodgkin's lymphoma treated with CHOP in 2005. He had amputations of the left first and second toes secondary to osteomyelitis. Does not appear that he has had an x-ray of the right foot ABI in our clinic on the right was noncompressible [see full arterial discussion above]. 1/19; area on the tip of the right great toe under a very mycotic nail tip. We use silver alginate. 1/26; area of the tip of the right great toe under a very mycotic nail. A lot of this seems to have closed down we have been using silver alginate 2/9; the areas on the tip of the right great toe under a very mycotic nail. A lot of this continues to close down. I do not think there is a subungual problem. I am hopeful that this mycotic nail which is thick and raised will allow full epithelialization of the wound 2/23; this was a difficult area on the tip of his right great toe. Thick mycotic nail over the base of the wound. This appears to have closed down now. The nail itself is thick split with considerable subungual debris. READMISSION 07/25/2019 This is a 86 year old man that we cared for earlier this year in the clinic without a wound on the tip of his right great toe under a very mycotic toenail. He is a type II  diabetic. His ABIs have been noncompressible however he has been felt to have adequate blood flow in the right to heal the wound. We were able to get this wound to close over as I understand things he followed with Dr. Caprice Beaver had some debridement of the nail everything looks fine and apparently the wound bed was washed off by his wife over the weekend and by Monday it was open.  His daughter is able to show me pictures. Unfortunately this now has exposed bone raising the possibility that he has had underlying osteomyelitis all along. He has been using Silvadene cream on this he has been referred back to the clinic by Dr. Caprice Beaver. He had x-rays of the right foot that did not show evidence of osteomyelitis in the right foot. He has not been on antibiotics He also had an x-ray of the left foot that showed findings suspicious for osteomyelitis involving the residual base of the first proximal phalanx and the proximal phalanx of the left second toe as well as the left second metatarsal head however he has no wounds in this area. 4/12; culture from last time of bone in the right great toe was negative. The patient's wife apparently scrubbed this toe vigorously and according to his daughter reopen the wound however unfortunately it is all exposed bone last time. I removed some of this and we use Prisma unfortunately the area does not look much better today. Previous x-rays done in podiatry office Dr. Caprice Beaver did not show evidence of osteomyelitis. Nevertheless I think osteomyelitis here is probably quite likely. Still using Prisma 4/26; he took 2 weeks of doxycycline although his daughter is telling me he is not eating. He has loose bowel movements however I did not get the sense that this is changed all that much. He still has exposed bone. Culture I did last time showed a few coag negative staph I do not think that this is necessarily relevant. I still am operating under the premise that the patient probably has osteomyelitis. After considerable difficulty we are able to determine he is not allergic to cephalosporins I therefore gave him Keflex. 5/10; is tolerating Keflex 500 3 times daily quite well. He still has the small wound in the tip of his toe with exposed bone.Marland Kitchen Unfortunately he does not really have any granulation tissue. He saw Dr. Caprice Beaver of podiatry in [place]  last week I have not had a chance to look at his notes The patient had an extensive vascular work-up including an angiogram in 2019. He was felt to have adequate blood supply on the right at the time although he did have amputations of the left first and second toe by Dr. Oneida Alar. Electronic Signature(s) Signed: 08/29/2019 8:11:21 AM By: Linton Ham MD Entered By: Linton Ham on 08/28/2019 11:11:34 -------------------------------------------------------------------------------- Physical Exam Details Patient Name: Date of Service: [name] Schwartz E. 08/28/2019 10:00 A M Medical Record Number: [id number] Patient Account Number: [id number] Date of Birth/Sex: Treating RN: Mar 19, 1924 (86 y.o. [name] Primary Care Provider: Sherrie Mustache Other Clinician: Referring Provider: Treating Provider/Extender: Elder Love, LISA Weeks in Treatment: 4 Constitutional Patient is hypertensive.. Pulse regular and within target range for patient.Marland Kitchen Respirations regular, non-labored and within target range.. Temperature is normal and within the target range for the patient.Marland Kitchen Appears in no distress. Cardiovascular Pedal pulses are difficult to feel. Notes Wound exam; completely exposed bone but without any granulation. I did not debride this today. He does not have any surrounding soft  tissue infection Electronic Signature(s) Signed: 08/29/2019 8:11:21 AM By: Linton Ham MD Entered By: Linton Ham on 08/28/2019 11:14:33 -------------------------------------------------------------------------------- Physician Orders Details Patient Name: Date of Service: [name] [name] 08/28/2019 10:00 A M Medical Record Number: [id number] Patient Account Number: [id number] Date of Birth/Sex: Treating RN: [birth year] (86 y.o. [name] Primary Care Provider: Sherrie Mustache Other Clinician: Referring Provider: Treating Provider/Extender: Elder Love, LISA Weeks in  Treatment: 4 Verbal / Phone Orders: No Diagnosis Coding ICD-10 Coding Code Description E11.621 Type 2 diabetes mellitus with foot ulcer L97.514 Non-pressure chronic ulcer of other part of right foot with necrosis of bone E11.40 Type 2 diabetes mellitus with diabetic neuropathy, unspecified Follow-up Appointments Return Appointment in 2 weeks. Dressing Change Frequency Wound #2 Right T Great oe Change dressing three times week. - home health to change 2x/week on week that patient is seen at wound clinic Wound Cleansing Wound #2 Right T Great oe May shower and wash wound with soap and water. - on days that dressing is changed Primary Wound Dressing Wound #2 Right T Great oe Silver Collagen - moisten with hydrogel or KY jelly Secondary Dressing Wound #2 Right T Great oe Kerlix/Rolled Gauze Dry [place] skilled nursing for wound care. - [facility] Consults Vascular Surgeon - Non healing ulcer right great toe - (ICD10 E11.621 - Type 2 diabetes mellitus with foot ulcer) Patient Medications llergies: Augmentin A Notifications Medication Indication Start End osteomyelitis 08/28/2019 cephalexin DOSE oral 500 mg capsule - 1 capsule oral tid for 14 days (continuing rx) Electronic Signature(s) Signed: 08/28/2019 11:18:49 AM By: Linton Ham MD Entered By: Linton Ham on 08/28/2019 11:18:47 -------------------------------------------------------------------------------- Problem List Details Patient Name: Date of Service: [name] [name] 08/28/2019 10:00 A M Medical Record Number: [id number] Patient Account Number: [id number] Date of Birth/Sex: Treating RN: 1924-01-31 (86 y.o. [name] Primary Care Provider: Sherrie Mustache Other Clinician: Referring Provider: Treating Provider/Extender: Elder Love, LISA Weeks in Treatment: 4 Active Problems ICD-10 Encounter Code Description Active Date MDM Diagnosis E11.621 Type 2  diabetes mellitus with foot ulcer 07/25/2019 No Yes L97.514 Non-pressure chronic ulcer of other part of right foot with necrosis of bone 07/25/2019 No Yes E11.40 Type 2 diabetes mellitus with diabetic neuropathy, unspecified 07/25/2019 No Yes Inactive Problems Resolved Problems Electronic Signature(s) Signed: 08/29/2019 8:11:21 AM By: Linton Ham MD Entered By: Linton Ham on 08/28/2019 11:08:56 -------------------------------------------------------------------------------- Progress Note Details Patient Name: Date of Service: [name] [name] 08/28/2019 10:00 A M Medical Record Number: [id number] Patient Account Number: [id number] Date of Birth/Sex: Treating RN: [birth year] (86 y.o. [name] Primary Care Provider: Sherrie Mustache Other Clinician: Referring Provider: Treating Provider/Extender: Elder Love, LISA Weeks in Treatment: 4 Subjective History of Present Illness (HPI) ADMISSION 05/02/2019 This is a 86 year old man accompanied by his daughter. He lives in [place] with his elderly wife. He has home caregivers as well. Apparently on February 28, 2019 they noted blood on his sock and noticed a wound on the tip of his right great toe. He has been cared for by Dr. Posey Pronto who is his podiatrist in [place]. Apparently they have been using Betadine to this area. The patient has a thick mycotic nail on the right as well which is embedded in the wound in some areas. Fortunately the patient has diabetic neuropathy and is not really experiencing any pain otherwise I think this would all be quite painful. The patient was previously treated  in 2019 for wounds on the left foot. This included a fairly thorough vascular review by vein and vascular Dr. Oneida Alar. His ABIs at that time were noncompressible bilaterally TBI on the right was 0.88 with biphasic waveforms on the right and monophasic waveforms at the left. He ended up having amputations of the left  first and second toes. He did have an angiogram on 06/25/2017 which showed on the right the right common femoral profundofemoral for Morris and superficial femoral arteries were widely patent the right popliteal was widely patent. Tibial vessels were not well opacified secondary to motion artifact however he was felt to have all 3 tibial vessels intact with some calcification and some areas of stenosis within the posterior tibial artery but relatively good flow to the right lower leg. He also had the left reviewed as well which was the source of the problem at that time. Past medical history; prostate cancer on oral antiandrogen therapy with bone mets, GI stromal tumor in 2005, type 2 diabetes with peripheral neuropathy, hypertension, atrial fibrillation on Eliquis, history of non-Hodgkin's lymphoma treated with CHOP in 2005. He had amputations of the left first and second toes secondary to osteomyelitis. Does not appear that he has had an x-ray of the right foot ABI in our clinic on the right was noncompressible [see full arterial discussion above]. 1/19; area on the tip of the right great toe under a very mycotic nail tip. We use silver alginate. 1/26; area of the tip of the right great toe under a very mycotic nail. A lot of this seems to have closed down we have been using silver alginate 2/9; the areas on the tip of the right great toe under a very mycotic nail. A lot of this continues to close down. I do not think there is a subungual problem. I am hopeful that this mycotic nail which is thick and raised will allow full epithelialization of the wound 2/23; this was a difficult area on the tip of his right great toe. Thick mycotic nail over the base of the wound. This appears to have closed down now. The nail itself is thick split with considerable subungual debris. READMISSION 07/25/2019 This is a 86 year old man that we cared for earlier this year in the clinic without a wound on the tip of his  right great toe under a very mycotic toenail. He is a type II diabetic. His ABIs have been noncompressible however he has been felt to have adequate blood flow in the right to heal the wound. We were able to get this wound to close over as I understand things he followed with Dr. Caprice Beaver had some debridement of the nail everything looks fine and apparently the wound bed was washed off by his wife over the weekend and by Monday it was open. His daughter is able to show me pictures. Unfortunately this now has exposed bone raising the possibility that he has had underlying osteomyelitis all along. He has been using Silvadene cream on this he has been referred back to the clinic by Dr. Caprice Beaver. He had x-rays of the right foot that did not show evidence of osteomyelitis in the right foot. He has not been on antibiotics He also had an x-ray of the left foot that showed findings suspicious for osteomyelitis involving the residual base of the first proximal phalanx and the proximal phalanx of the left second toe as well as the left second metatarsal head however he has no wounds in this area. 4/12;  culture from last time of bone in the right great toe was negative. The patient's wife apparently scrubbed this toe vigorously and according to his daughter reopen the wound however unfortunately it is all exposed bone last time. I removed some of this and we use Prisma unfortunately the area does not look much better today. Previous x-rays done in podiatry office Dr. Caprice Beaver did not show evidence of osteomyelitis. Nevertheless I think osteomyelitis here is probably quite likely. Still using Prisma 4/26; he took 2 weeks of doxycycline although his daughter is telling me he is not eating. He has loose bowel movements however I did not get the sense that this is changed all that much. He still has exposed bone. Culture I did last time showed a few coag negative staph I do not think that this is  necessarily relevant. I still am operating under the premise that the patient probably has osteomyelitis. After considerable difficulty we are able to determine he is not allergic to cephalosporins I therefore gave him Keflex. 5/10; is tolerating Keflex 500 3 times daily quite well. He still has the small wound in the tip of his toe with exposed bone.Marland Kitchen Unfortunately he does not really have any granulation tissue. He saw Dr. Caprice Beaver of podiatry in [place] last week I have not had a chance to look at his notes The patient had an extensive vascular work-up including an angiogram in 2019. He was felt to have adequate blood supply on the right at the time although he did have amputations of the left first and second toe by Dr. Oneida Alar. Objective Constitutional Patient is hypertensive.. Pulse regular and within target range for patient.Marland Kitchen Respirations regular, non-labored and within target range.. Temperature is normal and within the target range for the patient.Marland Kitchen Appears in no distress. Vitals Time Taken: 10:10 AM, Height: 70 in, Weight: 140 lbs, BMI: 20.1, Temperature: 97.7 F, Pulse: 73 bpm, Respiratory Rate: 18 breaths/min, Blood Pressure: 132/90 mmHg. Cardiovascular Pedal pulses are difficult to feel. General Notes: Wound exam; completely exposed bone but without any granulation. I did not debride this today. He does not have any surrounding soft tissue infection Integumentary (Hair, Skin) Wound #2 status is Open. Original cause of wound was Gradually Appeared. The wound is located on the Right T Great. The wound measures 0.4cm length x oe 0.6cm width x 0.3cm depth; 0.188cm^2 area and 0.057cm^3 volume. There is Fat Layer (Subcutaneous Tissue) Exposed exposed. There is no tunneling or undermining noted. There is a small amount of serosanguineous drainage noted. The wound margin is distinct with the outline attached to the wound base. There is large (67-100%) red, pink granulation within the  wound bed. There is no necrotic tissue within the wound bed. Assessment Active Problems ICD-10 Type 2 diabetes mellitus with foot ulcer Non-pressure chronic ulcer of other part of right foot with necrosis of bone Type 2 diabetes mellitus with diabetic neuropathy, unspecified Plan Follow-up Appointments: Return Appointment in 2 weeks. Dressing Change Frequency: Wound #2 Right T Great: oe Change dressing three times week. - home health to change 2x/week on week that patient is seen at wound clinic Wound Cleansing: Wound #2 Right T Great: oe May shower and wash wound with soap and water. - on days that dressing is changed Primary Wound Dressing: Wound #2 Right T Great: oe Silver Collagen - moisten with hydrogel or KY jelly Secondary Dressing: Wound #2 Right T Great: oe Kerlix/Rolled Gauze Dry Gauze Home Health: [facility] skilled nursing for wound care. -  [facility] Consults ordered were: Vascular Surgeon - Non healing ulcer right great toe The following medication(s) was prescribed: cephalexin oral 500 mg capsule 1 capsule oral tid for 14 days (continuing rx) for osteomyelitis starting 08/28/2019 1. I am going to continue with the silver collagen 2. I put him on another 2 weeks of cephalexin for the presumed osteomyelitis 3. His daughter asked about going back to see Dr. Oneida Alar if he needs a partial amputation of the great toe and I consented to this although Dr. Caprice Beaver is probably capable of this as well. 4. I reviewed his arterial studies from 2019 suggestive that we should be able to heal a surgical wound although I wonder whether Dr. Oneida Alar would like to repeat this. He also had an angiogram at the time Electronic Signature(s) Signed: 08/29/2019 8:11:21 AM By: Linton Ham MD Entered By: Linton Ham on 08/28/2019 11:20:07 -------------------------------------------------------------------------------- SuperBill Details Patient Name: Date of Service: [name] Schwartz E. 08/28/2019 Medical Record Number: [id number] Patient Account Number: [id number] Date of Birth/Sex: Treating RN: 10-11-1923 (86 y.o. [name] Primary Care Provider: Sherrie Mustache Other Clinician: Referring Provider: Treating Provider/Extender: Elder Love, LISA Weeks in Treatment: 4 Diagnosis Coding ICD-10 Codes Code Description [id number] Type 2 diabetes mellitus with foot ulcer L97.514 Non-pressure chronic ulcer of other part of right foot with necrosis of bone E11.40 Type 2 diabetes mellitus with diabetic neuropathy, unspecified Facility Procedures CPT4 Code: [id number] Description: 99213 - WOUND CARE VISIT-LEV 3 EST PT Modifier: Quantity: 1 Physician Procedures : CPT4 Code Description Modifier [id number] - WC PHYS LEVEL 4 - EST PT ICD-10 Diagnosis Description E11.621 Type 2 diabetes mellitus with foot ulcer L97.514 Non-pressure chronic ulcer of other part of right foot with necrosis of bone E11.40 Type 2  diabetes mellitus with diabetic neuropathy, unspecified Quantity: 1 Electronic Signature(s) Signed: 08/29/2019 8:11:21 AM By: Linton Ham MD Entered By: Linton Ham on 08/28/2019 11:20:26

## 2019-08-29 NOTE — Progress Notes (Signed)
[name], [name] ([id number]) Visit Report for 08/28/2019 Arrival Information Details Patient Name: Date of Service: [name] 08/28/2019 10:00 A M Medical Record Number: [id number] Patient Account Number: [id number] Date of Birth/Sex: Treating RN: 24-Feb-1924 (86 y.o. [name] Primary Care Arihaan Bellucci: Sherrie Mustache Other Clinician: Referring Mychaela Lennartz: Treating Makeya Hilgert/Extender: Elder Love, LISA Weeks in Treatment: 4 Visit Information History Since Last Visit Added or deleted any medications: No Patient Arrived: Wheel Chair Any new allergies or adverse reactions: No Arrival Time: 10:12 Had a fall or experienced change in No Accompanied By: family member activities of daily living that may affect Transfer Assistance: EasyPivot Patient Lift risk of falls: Patient Identification Verified: Yes Signs or symptoms of abuse/neglect since last visito No Secondary Verification Process Completed: Yes Hospitalized since last visit: No Patient Requires Transmission-Based Precautions: No Implantable device outside of the clinic excluding No Patient Has Alerts: Yes cellular tissue based products placed in the center Patient Alerts: R ABI non compressible since last visit: Has Dressing in Place as Prescribed: Yes Pain Present Now: No Electronic Signature(s) Signed: 08/29/2019 5:14:55 PM By: [name] Entered By: [name] on 08/28/2019 10:13:25 -------------------------------------------------------------------------------- Clinic Level of Care Assessment Details Patient Name: Date of Service: [name] 08/28/2019 10:00 A M Medical Record Number: [id number] Patient Account Number: [id number] Date of Birth/Sex: Treating RN: [birth year] (86 y.o. [name] Primary Care [name]: Sherrie Mustache Other Clinician: Referring Ladaisha Portillo: Treating Shanequia Kendrick/Extender: Elder Love, LISA Weeks in Treatment: 4 Clinic Level of Care  Assessment Items TOOL 4 Quantity Score X- 1 0 Use when only an EandM is performed on FOLLOW-UP visit ASSESSMENTS - Nursing Assessment / Reassessment X- 1 10 Reassessment of Co-morbidities (includes updates in patient status) X- 1 5 Reassessment of Adherence to Treatment Plan ASSESSMENTS - Wound and Skin A ssessment / Reassessment X - Simple Wound Assessment / Reassessment - one wound 1 5 []  - 0 Complex Wound Assessment / Reassessment - multiple wounds []  - 0 Dermatologic / Skin Assessment (not related to wound area) ASSESSMENTS - Focused Assessment []  - 0 Circumferential Edema Measurements - multi extremities []  - 0 Nutritional Assessment / Counseling / Intervention X- 1 5 Lower Extremity Assessment (monofilament, tuning fork, pulses) []  - 0 Peripheral Arterial Disease Assessment (using hand held doppler) ASSESSMENTS - Ostomy and/or Continence Assessment and Care []  - 0 Incontinence Assessment and Management []  - 0 Ostomy Care Assessment and Management (repouching, etc.) PROCESS - Coordination of Care X - Simple Patient / Family Education for ongoing care 1 15 []  - 0 Complex (extensive) Patient / Family Education for ongoing care X- 1 10 Staff obtains Consents, Records, T Results / Process Orders est X- 1 10 Staff telephones HHA, Nursing Homes / Clarify orders / etc []  - 0 Routine Transfer to another Facility (non-emergent condition) []  - 0 Routine Hospital Admission (non-emergent condition) []  - 0 New Admissions / Biomedical engineer / Ordering NPWT Apligraf, etc. , []  - 0 Emergency Hospital Admission (emergent condition) X- 1 10 Simple Discharge Coordination []  - 0 Complex (extensive) Discharge Coordination PROCESS - Special Needs []  - 0 Pediatric / Minor Patient Management []  - 0 Isolation Patient Management []  - 0 Hearing / Language / Visual special needs []  - 0 Assessment of Community assistance (transportation, D/C planning, etc.) []  -  0 Additional assistance / Altered mentation []  - 0 Support Surface(s) Assessment (bed, cushion, seat, etc.) INTERVENTIONS - Wound Cleansing / Measurement X - Simple Wound Cleansing -  one wound 1 5 []  - 0 Complex Wound Cleansing - multiple wounds X- 1 5 Wound Imaging (photographs - any number of wounds) []  - 0 Wound Tracing (instead of photographs) X- 1 5 Simple Wound Measurement - one wound []  - 0 Complex Wound Measurement - multiple wounds INTERVENTIONS - Wound Dressings X - Small Wound Dressing one or multiple wounds 1 10 []  - 0 Medium Wound Dressing one or multiple wounds []  - 0 Large Wound Dressing one or multiple wounds X- 1 5 Application of Medications - topical []  - 0 Application of Medications - injection INTERVENTIONS - Miscellaneous []  - 0 External ear exam []  - 0 Specimen Collection (cultures, biopsies, blood, body fluids, etc.) []  - 0 Specimen(s) / Culture(s) sent or taken to Lab for analysis []  - 0 Patient Transfer (multiple staff / Civil Service fast streamer / Similar devices) []  - 0 Simple Staple / Suture removal (25 or less) []  - 0 Complex Staple / Suture removal (26 or more) []  - 0 Hypo / Hyperglycemic Management (close monitor of Blood Glucose) []  - 0 Ankle / Brachial Index (ABI) - do not check if billed separately X- 1 5 Vital Signs Has the patient been seen at the hospital within the last three years: Yes Total Score: 105 Level Of Care: New/Established - Level 3 Electronic Signature(s) Signed: 08/29/2019 5:17:28 PM By: Levan Hurst RN, BSN Entered By: Levan Hurst on 08/28/2019 11:09:05 -------------------------------------------------------------------------------- Lower Extremity Assessment Details Patient Name: Date of Service: [name] 08/28/2019 10:00 A M Medical Record Number: [id number] Patient Account Number: [id number] Date of Birth/Sex: Treating RN: 11/02/1923 (86 y.o. [name] Primary Care [name]: Sherrie Mustache Other  Clinician: Referring Taunia Frasco: Treating [name]/Extender: Elder Love, LISA Weeks in Treatment: 4 Edema Assessment Assessed: [Left: No] [Right: No] Edema: [Left: N] [Right: o] Calf Left: Right: Point of Measurement: cm From Medial Instep cm 29 cm Ankle Left: Right: Point of Measurement: cm From Medial Instep cm 20.5 cm Vascular Assessment Pulses: Dorsalis Pedis Palpable: [Right:Yes] Electronic Signature(s) Signed: 08/29/2019 5:14:55 PM By: [name] Entered By: [name] on 08/28/2019 10:14:13 -------------------------------------------------------------------------------- Multi Wound Chart Details Patient Name: Date of Service: [name] 08/28/2019 10:00 A M Medical Record Number: [id number] Patient Account Number: [id number] Date of Birth/Sex: Treating RN: February 05, 1924 (86 y.o. [name] Primary Care Mohd Clemons: Sherrie Mustache Other Clinician: Referring Lecretia Buczek: Treating Derryl Uher/Extender: Elder Love, LISA Weeks in Treatment: 4 Vital Signs Height(in): 3 Pulse(bpm): 30 Weight(lbs): 140 Blood Pressure(mmHg): 132/90 Body Mass Index(BMI): 20 Temperature(F): 97.7 Respiratory Rate(breaths/min): 18 Photos: [2:No Photos Right T Great oe] [N/A:N/A N/A] Wound Location: [2:Gradually Appeared] [N/A:N/A] Wounding Event: [2:Diabetic Wound/Ulcer of the Lower] [N/A:N/A] Primary Etiology: [2:Extremity Cataracts, Arrhythmia, Hypertension,] [N/A:N/A] Comorbid History: [2:Peripheral Arterial Disease, Type II Diabetes, Osteomyelitis, Dementia, Neuropathy, Received Chemotherapy 06/15/2019] [N/A:N/A] Date Acquired: [2:4] [N/A:N/A] Weeks of Treatment: [2:Open] [N/A:N/A] Wound Status: [2:0.4x0.6x0.3] [N/A:N/A] Measurements L x W x D (cm) [2:0.188] [N/A:N/A] A (cm) : rea [2:0.057] [N/A:N/A] Volume (cm) : [2:-100.00%] [N/A:N/A] % Reduction in A rea: [2:-103.60%] [N/A:N/A] % Reduction in Volume: [2:Grade 2] [N/A:N/A] Classification:  [2:Small] [N/A:N/A] Exudate A mount: [2:Serosanguineous] [N/A:N/A] Exudate Type: [2:red, brown] [N/A:N/A] Exudate Color: [2:Distinct, outline attached] [N/A:N/A] Wound Margin: [2:Large (67-100%)] [N/A:N/A] Granulation A mount: [2:Red, Pink] [N/A:N/A] Granulation Quality: [2:None Present (0%)] [N/A:N/A] Necrotic A mount: [2:Fat Layer (Subcutaneous Tissue)] [N/A:N/A] Exposed Structures: [2:Exposed: Yes Fascia: No Tendon: No Muscle: No Joint: No Bone: No None] [N/A:N/A] Treatment Notes Electronic Signature(s) Signed: 08/29/2019 8:11:21  AM By: Linton Ham MD Signed: 08/29/2019 5:17:28 PM By: Levan Hurst RN, BSN Entered By: Linton Ham on 08/28/2019 11:09:05 -------------------------------------------------------------------------------- Multi-Disciplinary Care Plan Details Patient Name: Date of Service: Lester Kinsman NK E. 08/28/2019 10:00 A M Medical Record Number: [id number] Patient Account Number: [id number] Date of Birth/Sex: Treating RN: 1923/10/04 (86 y.o. [name] Primary Care Nike Southers: Sherrie Mustache Other Clinician: Referring Kytzia Gienger: Treating Elynn Patteson/Extender: Elder Love, LISA Weeks in Treatment: 4 Active Inactive Wound/Skin Impairment Nursing Diagnoses: Knowledge deficit related to ulceration/compromised skin integrity Goals: Patient/caregiver will verbalize understanding of skin care regimen Date Initiated: 07/25/2019 Target Resolution Date: 09/22/2019 Goal Status: Active Ulcer/skin breakdown will have a volume reduction of 30% by week 4 Date Initiated: 07/25/2019 Date Inactivated: 08/28/2019 Target Resolution Date: 08/25/2019 Goal Status: Unmet Unmet Reason: Osteomyelitis Interventions: Assess patient/caregiver ability to obtain necessary supplies Assess patient/caregiver ability to perform ulcer/skin care regimen upon admission and as needed Assess ulceration(s) every visit Notes: Electronic Signature(s) Signed: 08/29/2019 5:17:28 PM  By: Levan Hurst RN, BSN Entered By: Levan Hurst on 08/28/2019 10:39:08 -------------------------------------------------------------------------------- Pain Assessment Details Patient Name: Date of Service: [name] 08/28/2019 10:00 A M Medical Record Number: [id number] Patient Account Number: [id number] Date of Birth/Sex: Treating RN: 04/10/1924 (86 y.o. [name] Primary Care Hays Dunnigan: Sherrie Mustache Other Clinician: Referring Latrese Carolan: Treating Brittony Billick/Extender: Elder Love, LISA Weeks in Treatment: 4 Active Problems Location of Pain Severity and Description of Pain Patient Has Paino No Site Locations Pain Management and Medication Current Pain Management: Electronic Signature(s) Signed: 08/29/2019 5:14:55 PM By: [name] Entered By: [name] on 08/28/2019 10:13:56 -------------------------------------------------------------------------------- Patient/Caregiver Education Details Patient Name: Date of Service: [name] 5/10/2021andnbsp10:00 A M Medical Record Number: [id number] Patient Account Number: [id number] Date of Birth/Gender: Treating RN: 1923-09-26 (86 y.o. [name] Primary Care Physician: Sherrie Mustache Other Clinician: Referring Physician: Treating Physician/Extender: Vivi Ferns in Treatment: 4 Education Assessment Education Provided To: Patient Education Topics Provided Wound/Skin Impairment: Methods: Explain/Verbal Responses: State content correctly Electronic Signature(s) Signed: 08/29/2019 5:17:28 PM By: Levan Hurst RN, BSN Entered By: Levan Hurst on 08/28/2019 10:39:19 -------------------------------------------------------------------------------- Wound Assessment Details Patient Name: Date of Service: [name] 08/28/2019 10:00 A M Medical Record Number: [id number] Patient Account Number: [id number] Date of Birth/Sex: Treating  RN: [birth year] (86 y.o. [name] Primary Care Maysa Lynn: Sherrie Mustache Other Clinician: Referring Fontaine Hehl: Treating Leeann Bady/Extender: Elder Love, LISA Weeks in Treatment: 4 Wound Status Wound Number: 2 Primary Diabetic Wound/Ulcer of the Lower Extremity Etiology: Wound Location: Right T Great oe Wound Open Wounding Event: Gradually Appeared Status: Date Acquired: 06/15/2019 Comorbid Cataracts, Arrhythmia, Hypertension, Peripheral Arterial Disease, Weeks Of Treatment: 4 History: Type II Diabetes, Osteomyelitis, Dementia, Neuropathy, Received Clustered Wound: No Chemotherapy Photos Photo Uploaded By: Mikeal Hawthorne on 08/29/2019 09:14:34 Wound Measurements Length: (cm) 0.4 Width: (cm) 0.6 Depth: (cm) 0.3 Area: (cm) 0.188 Volume: (cm) 0.057 % Reduction in Area: -100% % Reduction in Volume: -103.6% Epithelialization: None Tunneling: No Undermining: No Wound Description Classification: Grade 2 Wound Margin: Distinct, outline attached Exudate Amount: Small Exudate Type: Serosanguineous Exudate Color: red, brown Wound Bed Granulation Amount: Large (67-100%) Granulation Quality: Red, Pink Necrotic Amount: None Present (0%) Foul Odor After Cleansing: No Slough/Fibrino No Exposed Structure Fascia Exposed: No Fat Layer (Subcutaneous Tissue) Exposed: Yes Tendon Exposed: No Muscle Exposed: No Joint Exposed: No Bone Exposed: No Electronic Signature(s) Signed: 08/29/2019 5:14:55 PM By: [name] Entered By: Verita Schneiders,  Larene Beach on 08/28/2019 10:15:01 -------------------------------------------------------------------------------- Vitals Details Patient Name: Date of Service: [name] 08/28/2019 10:00 A M Medical Record Number: [id number] Patient Account Number: [id number] Date of Birth/Sex: Treating RN: Feb 21, 1924 (86 y.o. [name] Primary Care Amada Hallisey: Sherrie Mustache Other Clinician: Referring  Tinleigh Whitmire: Treating Xia Stohr/Extender: Elder Love, LISA Weeks in Treatment: 4 Vital Signs Time Taken: 10:10 Temperature (F): 97.7 Height (in): 70 Pulse (bpm): 73 Weight (lbs): 140 Respiratory Rate (breaths/min): 18 Body Mass Index (BMI): 20.1 Blood Pressure (mmHg): 132/90 Reference Range: 80 - 120 mg / dl Electronic Signature(s) Signed: 08/29/2019 5:14:55 PM By: [name] Entered By: [name] on 08/28/2019 10:13:48

## 2019-08-30 DIAGNOSIS — I10 Essential (primary) hypertension: Secondary | ICD-10-CM | POA: Diagnosis not present

## 2019-08-30 DIAGNOSIS — C8581 Other specified types of non-Hodgkin lymphoma, lymph nodes of head, face, and neck: Secondary | ICD-10-CM | POA: Diagnosis not present

## 2019-08-30 DIAGNOSIS — L97519 Non-pressure chronic ulcer of other part of right foot with unspecified severity: Secondary | ICD-10-CM | POA: Diagnosis not present

## 2019-08-30 DIAGNOSIS — R269 Unspecified abnormalities of gait and mobility: Secondary | ICD-10-CM | POA: Diagnosis not present

## 2019-08-30 DIAGNOSIS — C61 Malignant neoplasm of prostate: Secondary | ICD-10-CM | POA: Diagnosis not present

## 2019-08-30 DIAGNOSIS — E1165 Type 2 diabetes mellitus with hyperglycemia: Secondary | ICD-10-CM | POA: Diagnosis not present

## 2019-09-01 DIAGNOSIS — C8581 Other specified types of non-Hodgkin lymphoma, lymph nodes of head, face, and neck: Secondary | ICD-10-CM | POA: Diagnosis not present

## 2019-09-01 DIAGNOSIS — C61 Malignant neoplasm of prostate: Secondary | ICD-10-CM | POA: Diagnosis not present

## 2019-09-01 DIAGNOSIS — R269 Unspecified abnormalities of gait and mobility: Secondary | ICD-10-CM | POA: Diagnosis not present

## 2019-09-01 DIAGNOSIS — I10 Essential (primary) hypertension: Secondary | ICD-10-CM | POA: Diagnosis not present

## 2019-09-01 DIAGNOSIS — L97519 Non-pressure chronic ulcer of other part of right foot with unspecified severity: Secondary | ICD-10-CM | POA: Diagnosis not present

## 2019-09-01 DIAGNOSIS — E1165 Type 2 diabetes mellitus with hyperglycemia: Secondary | ICD-10-CM | POA: Diagnosis not present

## 2019-09-04 DIAGNOSIS — R269 Unspecified abnormalities of gait and mobility: Secondary | ICD-10-CM | POA: Diagnosis not present

## 2019-09-04 DIAGNOSIS — C8581 Other specified types of non-Hodgkin lymphoma, lymph nodes of head, face, and neck: Secondary | ICD-10-CM | POA: Diagnosis not present

## 2019-09-04 DIAGNOSIS — E1165 Type 2 diabetes mellitus with hyperglycemia: Secondary | ICD-10-CM | POA: Diagnosis not present

## 2019-09-04 DIAGNOSIS — C61 Malignant neoplasm of prostate: Secondary | ICD-10-CM | POA: Diagnosis not present

## 2019-09-04 DIAGNOSIS — I10 Essential (primary) hypertension: Secondary | ICD-10-CM | POA: Diagnosis not present

## 2019-09-04 DIAGNOSIS — L97519 Non-pressure chronic ulcer of other part of right foot with unspecified severity: Secondary | ICD-10-CM | POA: Diagnosis not present

## 2019-09-05 ENCOUNTER — Telehealth: Payer: Self-pay | Admitting: *Deleted

## 2019-09-05 DIAGNOSIS — C8581 Other specified types of non-Hodgkin lymphoma, lymph nodes of head, face, and neck: Secondary | ICD-10-CM | POA: Diagnosis not present

## 2019-09-05 DIAGNOSIS — I10 Essential (primary) hypertension: Secondary | ICD-10-CM | POA: Diagnosis not present

## 2019-09-05 DIAGNOSIS — Z7189 Other specified counseling: Secondary | ICD-10-CM

## 2019-09-05 DIAGNOSIS — C7951 Secondary malignant neoplasm of bone: Secondary | ICD-10-CM

## 2019-09-05 DIAGNOSIS — E1165 Type 2 diabetes mellitus with hyperglycemia: Secondary | ICD-10-CM | POA: Diagnosis not present

## 2019-09-05 DIAGNOSIS — R269 Unspecified abnormalities of gait and mobility: Secondary | ICD-10-CM | POA: Diagnosis not present

## 2019-09-05 DIAGNOSIS — L97519 Non-pressure chronic ulcer of other part of right foot with unspecified severity: Secondary | ICD-10-CM | POA: Diagnosis not present

## 2019-09-05 DIAGNOSIS — C61 Malignant neoplasm of prostate: Secondary | ICD-10-CM

## 2019-09-05 NOTE — Telephone Encounter (Signed)
Stephanie with [facility] called and stated that patient is transitioning to their Palliative Care and wanted to know if [name] will still be the patient's attending physician.  Please Advise.

## 2019-09-05 NOTE — Telephone Encounter (Signed)
yes

## 2019-09-05 NOTE — Telephone Encounter (Signed)
Stephanie Notified.

## 2019-09-06 ENCOUNTER — Other Ambulatory Visit: Payer: Self-pay

## 2019-09-06 ENCOUNTER — Inpatient Hospital Stay (HOSPITAL_COMMUNITY): Payer: Medicare Other

## 2019-09-06 ENCOUNTER — Encounter (HOSPITAL_COMMUNITY): Payer: Self-pay

## 2019-09-06 VITALS — BP 125/67 | HR 94 | Temp 96.9°F | Resp 16

## 2019-09-06 DIAGNOSIS — C49A3 Gastrointestinal stromal tumor of small intestine: Secondary | ICD-10-CM | POA: Diagnosis not present

## 2019-09-06 DIAGNOSIS — E1165 Type 2 diabetes mellitus with hyperglycemia: Secondary | ICD-10-CM | POA: Diagnosis not present

## 2019-09-06 DIAGNOSIS — E119 Type 2 diabetes mellitus without complications: Secondary | ICD-10-CM | POA: Diagnosis not present

## 2019-09-06 DIAGNOSIS — C8581 Other specified types of non-Hodgkin lymphoma, lymph nodes of head, face, and neck: Secondary | ICD-10-CM | POA: Diagnosis not present

## 2019-09-06 DIAGNOSIS — C7951 Secondary malignant neoplasm of bone: Secondary | ICD-10-CM | POA: Diagnosis not present

## 2019-09-06 DIAGNOSIS — C61 Malignant neoplasm of prostate: Secondary | ICD-10-CM

## 2019-09-06 DIAGNOSIS — L97519 Non-pressure chronic ulcer of other part of right foot with unspecified severity: Secondary | ICD-10-CM | POA: Diagnosis not present

## 2019-09-06 DIAGNOSIS — D472 Monoclonal gammopathy: Secondary | ICD-10-CM | POA: Diagnosis not present

## 2019-09-06 DIAGNOSIS — R269 Unspecified abnormalities of gait and mobility: Secondary | ICD-10-CM | POA: Diagnosis not present

## 2019-09-06 DIAGNOSIS — C49A0 Gastrointestinal stromal tumor, unspecified site: Secondary | ICD-10-CM

## 2019-09-06 DIAGNOSIS — I10 Essential (primary) hypertension: Secondary | ICD-10-CM | POA: Diagnosis not present

## 2019-09-06 LAB — CBC WITH DIFFERENTIAL/PLATELET
Abs Immature Granulocytes: 0.19 10*3/uL — ABNORMAL HIGH (ref 0.00–0.07)
Basophils Absolute: 0 10*3/uL (ref 0.0–0.1)
Basophils Relative: 0 %
Eosinophils Absolute: 0.1 10*3/uL (ref 0.0–0.5)
Eosinophils Relative: 1 %
HCT: 32.7 % — ABNORMAL LOW (ref 39.0–52.0)
Hemoglobin: 10.9 g/dL — ABNORMAL LOW (ref 13.0–17.0)
Immature Granulocytes: 2 %
Lymphocytes Relative: 27 %
Lymphs Abs: 2.2 10*3/uL (ref 0.7–4.0)
MCH: 32.3 pg (ref 26.0–34.0)
MCHC: 33.3 g/dL (ref 30.0–36.0)
MCV: 97 fL (ref 80.0–100.0)
Monocytes Absolute: 1.4 10*3/uL — ABNORMAL HIGH (ref 0.1–1.0)
Monocytes Relative: 17 %
Neutro Abs: 4.3 10*3/uL (ref 1.7–7.7)
Neutrophils Relative %: 53 %
Platelets: 156 10*3/uL (ref 150–400)
RBC: 3.37 MIL/uL — ABNORMAL LOW (ref 4.22–5.81)
RDW: 13.5 % (ref 11.5–15.5)
WBC: 8.2 10*3/uL (ref 4.0–10.5)
nRBC: 0 % (ref 0.0–0.2)

## 2019-09-06 LAB — COMPREHENSIVE METABOLIC PANEL
ALT: 12 U/L (ref 0–44)
AST: 12 U/L — ABNORMAL LOW (ref 15–41)
Albumin: 3.5 g/dL (ref 3.5–5.0)
Alkaline Phosphatase: 57 U/L (ref 38–126)
Anion gap: 9 (ref 5–15)
BUN: 16 mg/dL (ref 8–23)
CO2: 23 mmol/L (ref 22–32)
Calcium: 10.8 mg/dL — ABNORMAL HIGH (ref 8.9–10.3)
Chloride: 105 mmol/L (ref 98–111)
Creatinine, Ser: 0.84 mg/dL (ref 0.61–1.24)
GFR calc Af Amer: >60 mL/min (ref >60)
GFR calc non Af Amer: >60 mL/min (ref >60)
Glucose, Bld: 113 mg/dL — ABNORMAL HIGH (ref 70–99)
Potassium: 4.1 mmol/L (ref 3.5–5.1)
Sodium: 137 mmol/L (ref 135–145)
Total Bilirubin: 0.5 mg/dL (ref 0.3–1.2)
Total Protein: 6 g/dL — ABNORMAL LOW (ref 6.5–8.1)

## 2019-09-06 MED ORDER — LEUPROLIDE ACETATE (6 MONTH) 45 MG ~~LOC~~ KIT
45.0000 mg | PACK | Freq: Once | SUBCUTANEOUS | Status: AC
  Administered 2019-09-06: 45 mg via SUBCUTANEOUS
  Filled 2019-09-06: qty 45

## 2019-09-06 NOTE — Addendum Note (Signed)
Addended by: Logan Bores on: 09/06/2019 03:37 PM   Modules accepted: Orders

## 2019-09-06 NOTE — Patient Instructions (Signed)
[facility] Discharge Instructions  Received Eligard injection today. Follow-up as scheduled. Call clinic for any questions or concerns   Thank you for choosing [facility] at [facility] to provide your oncology and hematology care.  To afford each patient quality time with our provider, please arrive at least 15 minutes before your scheduled appointment time.   If you have a lab appointment with the Cancer Center please come in thru the Main Entrance and check in at the main information desk.  You need to re-schedule your appointment should you arrive 10 or more minutes late.  We strive to give you quality time with our providers, and arriving late affects you and other patients whose appointments are after yours.  Also, if you no show three or more times for appointments you may be dismissed from the clinic at the providers discretion.     Again, thank you for choosing [facility].  Our hope is that these requests will decrease the amount of time that you wait before being seen by our physicians.       _____________________________________________________________  Should you have questions after your visit to [facility], please contact our office at [phone] between the hours of 8:00 a.m. and 4:30 p.m.  Voicemails left after 4:00 p.m. will not be returned until the following business day.  For prescription refill requests, have your pharmacy contact our office and allow 72 hours.    Due to Covid, you will need to wear a mask upon entering the hospital. If you do not have a mask, a mask will be given to you at the Main Entrance upon arrival. For doctor visits, patients may have 1 support person with them. For treatment visits, patients can not have anyone with them due to social distancing guidelines and our immunocompromised population.     

## 2019-09-06 NOTE — Addendum Note (Signed)
Addended by: [name] on: 09/06/2019 04:04 PM   Modules accepted: Orders

## 2019-09-06 NOTE — Telephone Encounter (Signed)
Stephanie with [facility] called back stating she never received referral order in her workque.  Col[place] asked that an order be placed and for [name] to include any services she would like for them to provide such as symptom management or advance care planning

## 2019-09-06 NOTE — Progress Notes (Signed)
[name] tolerated Eligard injection well without complaints or incident. VSS Pt discharged via wheelchair in satisfactory condition accompanied by family member

## 2019-09-07 ENCOUNTER — Ambulatory Visit (INDEPENDENT_AMBULATORY_CARE_PROVIDER_SITE_OTHER): Payer: Medicare Other | Admitting: Internal Medicine

## 2019-09-07 DIAGNOSIS — C61 Malignant neoplasm of prostate: Secondary | ICD-10-CM | POA: Diagnosis not present

## 2019-09-07 DIAGNOSIS — R269 Unspecified abnormalities of gait and mobility: Secondary | ICD-10-CM | POA: Diagnosis not present

## 2019-09-07 DIAGNOSIS — I10 Essential (primary) hypertension: Secondary | ICD-10-CM | POA: Diagnosis not present

## 2019-09-07 DIAGNOSIS — C8581 Other specified types of non-Hodgkin lymphoma, lymph nodes of head, face, and neck: Secondary | ICD-10-CM | POA: Diagnosis not present

## 2019-09-07 DIAGNOSIS — E1165 Type 2 diabetes mellitus with hyperglycemia: Secondary | ICD-10-CM | POA: Diagnosis not present

## 2019-09-07 DIAGNOSIS — L97519 Non-pressure chronic ulcer of other part of right foot with unspecified severity: Secondary | ICD-10-CM | POA: Diagnosis not present

## 2019-09-07 LAB — HEMOGLOBIN A1C
Hgb A1c MFr Bld: 5.1 % (ref 4.8–5.6)
Mean Plasma Glucose: 100 mg/dL

## 2019-09-08 DIAGNOSIS — L97522 Non-pressure chronic ulcer of other part of left foot with fat layer exposed: Secondary | ICD-10-CM | POA: Diagnosis not present

## 2019-09-08 DIAGNOSIS — E1151 Type 2 diabetes mellitus with diabetic peripheral angiopathy without gangrene: Secondary | ICD-10-CM | POA: Diagnosis not present

## 2019-09-08 DIAGNOSIS — E1142 Type 2 diabetes mellitus with diabetic polyneuropathy: Secondary | ICD-10-CM | POA: Diagnosis not present

## 2019-09-08 DIAGNOSIS — L97519 Non-pressure chronic ulcer of other part of right foot with unspecified severity: Secondary | ICD-10-CM | POA: Diagnosis not present

## 2019-09-11 ENCOUNTER — Encounter (HOSPITAL_BASED_OUTPATIENT_CLINIC_OR_DEPARTMENT_OTHER): Payer: Medicare Other | Admitting: Internal Medicine

## 2019-09-11 DIAGNOSIS — E1140 Type 2 diabetes mellitus with diabetic neuropathy, unspecified: Secondary | ICD-10-CM | POA: Diagnosis not present

## 2019-09-11 DIAGNOSIS — E1142 Type 2 diabetes mellitus with diabetic polyneuropathy: Secondary | ICD-10-CM | POA: Diagnosis not present

## 2019-09-11 DIAGNOSIS — L97514 Non-pressure chronic ulcer of other part of right foot with necrosis of bone: Secondary | ICD-10-CM | POA: Diagnosis not present

## 2019-09-11 DIAGNOSIS — E1151 Type 2 diabetes mellitus with diabetic peripheral angiopathy without gangrene: Secondary | ICD-10-CM | POA: Diagnosis not present

## 2019-09-11 DIAGNOSIS — E11621 Type 2 diabetes mellitus with foot ulcer: Secondary | ICD-10-CM | POA: Diagnosis not present

## 2019-09-11 DIAGNOSIS — E1169 Type 2 diabetes mellitus with other specified complication: Secondary | ICD-10-CM | POA: Diagnosis not present

## 2019-09-11 DIAGNOSIS — M869 Osteomyelitis, unspecified: Secondary | ICD-10-CM | POA: Diagnosis not present

## 2019-09-12 ENCOUNTER — Encounter (HOSPITAL_BASED_OUTPATIENT_CLINIC_OR_DEPARTMENT_OTHER): Payer: Medicare Other | Admitting: Internal Medicine

## 2019-09-12 ENCOUNTER — Ambulatory Visit (INDEPENDENT_AMBULATORY_CARE_PROVIDER_SITE_OTHER): Payer: Medicare Other | Admitting: Internal Medicine

## 2019-09-12 DIAGNOSIS — C61 Malignant neoplasm of prostate: Secondary | ICD-10-CM | POA: Diagnosis not present

## 2019-09-12 DIAGNOSIS — I10 Essential (primary) hypertension: Secondary | ICD-10-CM | POA: Diagnosis not present

## 2019-09-12 DIAGNOSIS — L97519 Non-pressure chronic ulcer of other part of right foot with unspecified severity: Secondary | ICD-10-CM | POA: Diagnosis not present

## 2019-09-12 DIAGNOSIS — C8581 Other specified types of non-Hodgkin lymphoma, lymph nodes of head, face, and neck: Secondary | ICD-10-CM | POA: Diagnosis not present

## 2019-09-12 DIAGNOSIS — E1165 Type 2 diabetes mellitus with hyperglycemia: Secondary | ICD-10-CM | POA: Diagnosis not present

## 2019-09-12 DIAGNOSIS — R269 Unspecified abnormalities of gait and mobility: Secondary | ICD-10-CM | POA: Diagnosis not present

## 2019-09-13 DIAGNOSIS — E1165 Type 2 diabetes mellitus with hyperglycemia: Secondary | ICD-10-CM | POA: Diagnosis not present

## 2019-09-13 DIAGNOSIS — C8581 Other specified types of non-Hodgkin lymphoma, lymph nodes of head, face, and neck: Secondary | ICD-10-CM | POA: Diagnosis not present

## 2019-09-13 DIAGNOSIS — C61 Malignant neoplasm of prostate: Secondary | ICD-10-CM | POA: Diagnosis not present

## 2019-09-13 DIAGNOSIS — L97519 Non-pressure chronic ulcer of other part of right foot with unspecified severity: Secondary | ICD-10-CM | POA: Diagnosis not present

## 2019-09-13 DIAGNOSIS — I10 Essential (primary) hypertension: Secondary | ICD-10-CM | POA: Diagnosis not present

## 2019-09-13 DIAGNOSIS — R269 Unspecified abnormalities of gait and mobility: Secondary | ICD-10-CM | POA: Diagnosis not present

## 2019-09-14 DIAGNOSIS — C8581 Other specified types of non-Hodgkin lymphoma, lymph nodes of head, face, and neck: Secondary | ICD-10-CM | POA: Diagnosis not present

## 2019-09-14 DIAGNOSIS — R269 Unspecified abnormalities of gait and mobility: Secondary | ICD-10-CM | POA: Diagnosis not present

## 2019-09-14 DIAGNOSIS — E1165 Type 2 diabetes mellitus with hyperglycemia: Secondary | ICD-10-CM | POA: Diagnosis not present

## 2019-09-14 DIAGNOSIS — L97519 Non-pressure chronic ulcer of other part of right foot with unspecified severity: Secondary | ICD-10-CM | POA: Diagnosis not present

## 2019-09-14 DIAGNOSIS — C61 Malignant neoplasm of prostate: Secondary | ICD-10-CM | POA: Diagnosis not present

## 2019-09-14 DIAGNOSIS — I10 Essential (primary) hypertension: Secondary | ICD-10-CM | POA: Diagnosis not present

## 2019-09-14 NOTE — Progress Notes (Signed)
DREX, PIERSOL ([id number]) Visit Report for 09/11/2019 HPI Details Patient Name: Date of Service: [name] 09/11/2019 10:30 A M Medical Record Number: [id number] Patient Account Number: [id number] Date of Birth/Sex: Treating RN: [birth year] (86 y.o. [name] Primary Care Provider: Sherrie Mustache Other Clinician: Referring Provider: Treating Provider/Extender: Heber [place] in Treatment: 6 History of Present Illness HPI Description: ADMISSION 05/02/2019 This is a 86 year old man accompanied by his daughter. He lives in [place] with his elderly wife. He has home caregivers as well. Apparently on February 28, 2019 they noted blood on his sock and noticed a wound on the tip of his right great toe. He has been cared for by Dr. Posey Pronto who is his podiatrist in [place]. Apparently they have been using Betadine to this area. The patient has a thick mycotic nail on the right as well which is embedded in the wound in some areas. Fortunately the patient has diabetic neuropathy and is not really experiencing any pain otherwise I think this would all be quite painful. The patient was previously treated in 2019 for wounds on the left foot. This included a fairly thorough vascular review by vein and vascular Dr. Oneida Alar. His ABIs at that time were noncompressible bilaterally TBI on the right was 0.88 with biphasic waveforms on the right and monophasic waveforms at the left. He ended up having amputations of the left first and second toes. He did have an angiogram on 06/25/2017 which showed on the right the right common femoral profundofemoral for [place] and superficial femoral arteries were widely patent the right popliteal was widely patent. Tibial vessels were not well opacified secondary to motion artifact however he was felt to have all 3 tibial vessels intact with some calcification and some areas of stenosis within the posterior tibial artery  but relatively good flow to the right lower leg. He also had the left reviewed as well which was the source of the problem at that time. Past medical history; prostate cancer on oral antiandrogen therapy with bone mets, GI stromal tumor in 2005, type 2 diabetes with peripheral neuropathy, hypertension, atrial fibrillation on Eliquis, history of non-Hodgkin's lymphoma treated with CHOP in 2005. He had amputations of the left first and second toes secondary to osteomyelitis. Does not appear that he has had an x-ray of the right foot ABI in our clinic on the right was noncompressible [see full arterial discussion above]. 1/19; area on the tip of the right great toe under a very mycotic nail tip. We use silver alginate. 1/26; area of the tip of the right great toe under a very mycotic nail. A lot of this seems to have closed down we have been using silver alginate 2/9; the areas on the tip of the right great toe under a very mycotic nail. A lot of this continues to close down. I do not think there is a subungual problem. I am hopeful that this mycotic nail which is thick and raised will allow full epithelialization of the wound 2/23; this was a difficult area on the tip of his right great toe. Thick mycotic nail over the base of the wound. This appears to have closed down now. The nail itself is thick split with considerable subungual debris. READMISSION 07/25/2019 This is a 86 year old man that we cared for earlier this year in the clinic without a wound on the tip of his right great toe under a very mycotic toenail. He is a type II diabetic.  His ABIs have been noncompressible however he has been felt to have adequate blood flow in the right to heal the wound. We were able to get this wound to close over as I understand things he followed with Dr. Caprice Beaver had some debridement of the nail everything looks fine and apparently the wound bed was washed off by his wife over the weekend and by Monday it was  open. His daughter is able to show me pictures. Unfortunately this now has exposed bone raising the possibility that he has had underlying osteomyelitis all along. He has been using Silvadene cream on this he has been referred back to the clinic by Dr. Caprice Beaver. He had x-rays of the right foot that did not show evidence of osteomyelitis in the right foot. He has not been on antibiotics He also had an x-ray of the left foot that showed findings suspicious for osteomyelitis involving the residual base of the first proximal phalanx and the proximal phalanx of the left second toe as well as the left second metatarsal head however he has no wounds in this area. 4/12; culture from last time of bone in the right great toe was negative. The patient's wife apparently scrubbed this toe vigorously and according to his daughter reopen the wound however unfortunately it is all exposed bone last time. I removed some of this and we use Prisma unfortunately the area does not look much better today. Previous x-rays done in podiatry office Dr. Caprice Beaver did not show evidence of osteomyelitis. Nevertheless I think osteomyelitis here is probably quite likely. Still using Prisma 4/26; he took 2 weeks of doxycycline although his daughter is telling me he is not eating. He has loose bowel movements however I did not get the sense that this is changed all that much. He still has exposed bone. Culture I did last time showed a few coag negative staph I do not think that this is necessarily relevant. I still am operating under the premise that the patient probably has osteomyelitis. After considerable difficulty we are able to determine he is not allergic to cephalosporins I therefore gave him Keflex. 5/10; is tolerating Keflex 500 3 times daily quite well. He still has the small wound in the tip of his toe with exposed bone.Marland Kitchen Unfortunately he does not really have any granulation tissue. He saw Dr. Caprice Beaver of podiatry in  [place] last week I have not had a chance to look at his notes The patient had an extensive vascular work-up including an angiogram in 2019. He was felt to have adequate blood supply on the right at the time although he did have amputations of the left first and second toe by Dr. Oneida Alar. 09/11/19 -Patient is back and is about to finish his Keflex, appointment with Dr. Oneida Alar is in the works. The right great toe wound looks about the same, the left third toe dorsal wound has healed. There was a small eschar covering it that was removed by podiatry.According to patient's daughter the right great toe base on the plantar surface had some redness that seems to have disappeared. Electronic Signature(s) Signed: 09/11/2019 11:21:19 AM By: Tobi Bastos MD, MBA Entered By: Tobi Bastos on 09/11/2019 11:21:19 -------------------------------------------------------------------------------- Physical Exam Details Patient Name: Date of Service: [name] 09/11/2019 10:30 A M Medical Record Number: [id number] Patient Account Number: [id number] Date of Birth/Sex: Treating RN: [birth year] (86 y.o. [name] Primary Care Provider: Sherrie Mustache Other Clinician: Referring Provider: Treating Provider/Extender: Heber [place]  in Treatment: 6 Constitutional alert and oriented x 3. sitting or standing blood pressure is within target range for patient.. supine blood pressure is within target range for patient.. pulse regular and within target range for patient.Marland Kitchen respirations regular, non-labored and within target range for patient.Marland Kitchen temperature within target range for patient.. . . Well- nourished and well-hydrated in no acute distress. Notes Right great toe tip wound is about the same, no redness noted in the surrounding skin, Electronic Signature(s) Signed: 09/11/2019 11:21:55 AM By: Tobi Bastos MD, MBA Entered By: Tobi Bastos on 09/11/2019  11:21:53 -------------------------------------------------------------------------------- Physician Orders Details Patient Name: Date of Service: [name] 09/11/2019 10:30 A M Medical Record Number: [id number] Patient Account Number: [id number] Date of Birth/Sex: Treating RN: 12-31-1923 (86 y.o. [name] Primary Care Provider: Sherrie Mustache Other Clinician: Referring Provider: Treating Provider/Extender: Heber [place] in Treatment: 6 Verbal / Phone Orders: No Diagnosis Coding ICD-10 Coding Code Description E11.621 Type 2 diabetes mellitus with foot ulcer L97.514 Non-pressure chronic ulcer of other part of right foot with necrosis of bone E11.40 Type 2 diabetes mellitus with diabetic neuropathy, unspecified Follow-up Appointments Return Appointment in 2 weeks. Dressing Change Frequency Wound #2 Right T Great oe Change dressing three times week. - home health to change 2x/week on week that patient is seen at wound clinic Wound Cleansing Wound #2 Right T Great oe May shower and wash wound with soap and water. - on days that dressing is changed Primary Wound Dressing Wound #2 Right T Great oe Silver Collagen - moisten with hydrogel or KY jelly Secondary Dressing Wound #2 Right T Great oe Kerlix/Rolled Gauze Dry [facility] skilled nursing for wound care. - [facility] Electronic Signature(s) Signed: 09/11/2019 5:12:22 PM By: Levan Hurst RN, BSN Signed: 09/14/2019 4:57:23 PM By: Tobi Bastos MD, MBA Entered By: Levan Hurst on 09/11/2019 11:14:53 -------------------------------------------------------------------------------- Problem List Details Patient Name: Date of Service: [name] 09/11/2019 10:30 A M Medical Record Number: [id number] Patient Account Number: [id number] Date of Birth/Sex: Treating RN: 04-Jun-1923 (86 y.o. [name] Primary Care Provider: Sherrie Mustache Other Clinician: Referring Provider: Treating Provider/Extender: Heber [place] in Treatment: 6 Active Problems ICD-10 Encounter Code Description Active Date MDM Diagnosis E11.621 Type 2 diabetes mellitus with foot ulcer 07/25/2019 No Yes L97.514 Non-pressure chronic ulcer of other part of right foot with necrosis of bone 07/25/2019 No Yes E11.40 Type 2 diabetes mellitus with diabetic neuropathy, unspecified 07/25/2019 No Yes Inactive Problems Resolved Problems Electronic Signature(s) Signed: 09/11/2019 5:12:22 PM By: Levan Hurst RN, BSN Signed: 09/14/2019 4:57:23 PM By: Tobi Bastos MD, MBA Entered By: Levan Hurst on 09/11/2019 11:14:33 -------------------------------------------------------------------------------- Progress Note Details Patient Name: Date of Service: [name] 09/11/2019 10:30 A M Medical Record Number: [id number] Patient Account Number: [id number] Date of Birth/Sex: Treating RN: February 07, 1924 (86 y.o. [name] Primary Care Provider: Sherrie Mustache Other Clinician: Referring Provider: Treating Provider/Extender: Heber  in Treatment: 6 Subjective History of Present Illness (HPI) ADMISSION 05/02/2019 This is a 86 year old man accompanied by his daughter. He lives in [place] with his elderly wife. He has home caregivers as well. Apparently on February 28, 2019 they noted blood on his sock and noticed a wound on the tip of his right great toe. He has been cared for by Dr. Posey Pronto who is his podiatrist in [place]. Apparently they have been using Betadine to this area. The patient  has a thick mycotic nail on the right as well which is embedded in the wound in some areas. Fortunately the patient has diabetic neuropathy and is not really experiencing any pain otherwise I think this would all be quite painful. The patient was previously treated in 2019 for wounds on  the left foot. This included a fairly thorough vascular review by vein and vascular Dr. Oneida Alar. His ABIs at that time were noncompressible bilaterally TBI on the right was 0.88 with biphasic waveforms on the right and monophasic waveforms at the left. He ended up having amputations of the left first and second toes. He did have an angiogram on 06/25/2017 which showed on the right the right common femoral profundofemoral for [place] and superficial femoral arteries were widely patent the right popliteal was widely patent. Tibial vessels were not well opacified secondary to motion artifact however he was felt to have all 3 tibial vessels intact with some calcification and some areas of stenosis within the posterior tibial artery but relatively good flow to the right lower leg. He also had the left reviewed as well which was the source of the problem at that time. Past medical history; prostate cancer on oral antiandrogen therapy with bone mets, GI stromal tumor in 2005, type 2 diabetes with peripheral neuropathy, hypertension, atrial fibrillation on Eliquis, history of non-Hodgkin's lymphoma treated with CHOP in 2005. He had amputations of the left first and second toes secondary to osteomyelitis. Does not appear that he has had an x-ray of the right foot ABI in our clinic on the right was noncompressible [see full arterial discussion above]. 1/19; area on the tip of the right great toe under a very mycotic nail tip. We use silver alginate. 1/26; area of the tip of the right great toe under a very mycotic nail. A lot of this seems to have closed down we have been using silver alginate 2/9; the areas on the tip of the right great toe under a very mycotic nail. A lot of this continues to close down. I do not think there is a subungual problem. I am hopeful that this mycotic nail which is thick and raised will allow full epithelialization of the wound 2/23; this was a difficult area on the tip of his right  great toe. Thick mycotic nail over the base of the wound. This appears to have closed down now. The nail itself is thick split with considerable subungual debris. READMISSION 07/25/2019 This is a 86 year old man that we cared for earlier this year in the clinic without a wound on the tip of his right great toe under a very mycotic toenail. He is a type II diabetic. His ABIs have been noncompressible however he has been felt to have adequate blood flow in the right to heal the wound. We were able to get this wound to close over as I understand things he followed with Dr. Caprice Beaver had some debridement of the nail everything looks fine and apparently the wound bed was washed off by his wife over the weekend and by Monday it was open. His daughter is able to show me pictures. Unfortunately this now has exposed bone raising the possibility that he has had underlying osteomyelitis all along. He has been using Silvadene cream on this he has been referred back to the clinic by Dr. Caprice Beaver. He had x-rays of the right foot that did not show evidence of osteomyelitis in the right foot. He has not been on antibiotics He also had an  x-ray of the left foot that showed findings suspicious for osteomyelitis involving the residual base of the first proximal phalanx and the proximal phalanx of the left second toe as well as the left second metatarsal head however he has no wounds in this area. 4/12; culture from last time of bone in the right great toe was negative. The patient's wife apparently scrubbed this toe vigorously and according to his daughter reopen the wound however unfortunately it is all exposed bone last time. I removed some of this and we use Prisma unfortunately the area does not look much better today. Previous x-rays done in podiatry office Dr. Caprice Beaver did not show evidence of osteomyelitis. Nevertheless I think osteomyelitis here is probably quite likely. Still using Prisma 4/26; he took 2 weeks of  doxycycline although his daughter is telling me he is not eating. He has loose bowel movements however I did not get the sense that this is changed all that much. He still has exposed bone. Culture I did last time showed a few coag negative staph I do not think that this is necessarily relevant. I still am operating under the premise that the patient probably has osteomyelitis. After considerable difficulty we are able to determine he is not allergic to cephalosporins I therefore gave him Keflex. 5/10; is tolerating Keflex 500 3 times daily quite well. He still has the small wound in the tip of his toe with exposed bone.Marland Kitchen Unfortunately he does not really have any granulation tissue. He saw Dr. Caprice Beaver of podiatry in [place] last week I have not had a chance to look at his notes The patient had an extensive vascular work-up including an angiogram in 2019. He was felt to have adequate blood supply on the right at the time although he did have amputations of the left first and second toe by Dr. Oneida Alar. 09/11/19 -Patient is back and is about to finish his Keflex, appointment with Dr. Oneida Alar is in the works. The right great toe wound looks about the same, the left third toe dorsal wound has healed. There was a small eschar covering it that was removed by podiatry.According to patient's daughter the right great toe base on the plantar surface had some redness that seems to have disappeared. Objective Constitutional alert and oriented x 3. sitting or standing blood pressure is within target range for patient.. supine blood pressure is within target range for patient.. pulse regular and within target range for patient.Marland Kitchen respirations regular, non-labored and within target range for patient.Marland Kitchen temperature within target range for patient.. Well- nourished and well-hydrated in no acute distress. Vitals Time Taken: 10:39 AM, Height: 70 in, Weight: 140 lbs, BMI: 20.1, Temperature: 98.1 F, Pulse: 78 bpm,  Respiratory Rate: 18 breaths/min, Blood Pressure: 149/94 mmHg. General Notes: Right great toe tip wound is about the same, no redness noted in the surrounding skin, Integumentary (Hair, Skin) Wound #2 status is Open. Original cause of wound was Gradually Appeared. The wound is located on the Right T Great. The wound measures 0.3cm length x oe 0.5cm width x 0.3cm depth; 0.118cm^2 area and 0.035cm^3 volume. There is Fat Layer (Subcutaneous Tissue) Exposed exposed. There is no tunneling or undermining noted. There is a small amount of serosanguineous drainage noted. The wound margin is distinct with the outline attached to the wound base. There is large (67-100%) pink, pale granulation within the wound bed. There is no necrotic tissue within the wound bed. Assessment Active Problems ICD-10 Type 2 diabetes mellitus with foot ulcer Non-pressure  chronic ulcer of other part of right foot with necrosis of bone Type 2 diabetes mellitus with diabetic neuropathy, unspecified Plan Follow-up Appointments: Return Appointment in 2 weeks. Dressing Change Frequency: Wound #2 Right T Great: oe Change dressing three times week. - home health to change 2x/week on week that patient is seen at wound clinic Wound Cleansing: Wound #2 Right T Great: oe May shower and wash wound with soap and water. - on days that dressing is changed Primary Wound Dressing: Wound #2 Right T Great: oe Silver Collagen - moisten with hydrogel or KY jelly Secondary Dressing: Wound #2 Right T Great: oe Kerlix/Rolled Gauze Dry Gauze Home Health: [facility] skilled nursing for wound care. - [facility] 1. Continue silver collagen to the wound with home health helping with dressing changes 2. Patient's antibiotic course can be stopped is being 4 weeks of Keflex, I am concerned about side effects including C. difficile diarrhea 3. Patient will follow up with Dr. Oneida Alar will try to establish when this can be to address  amputation of the terminal phalanx Electronic Signature(s) Signed: 09/11/2019 11:23:05 AM By: Tobi Bastos MD, MBA Entered By: Tobi Bastos on 09/11/2019 11:23:04 -------------------------------------------------------------------------------- SuperBill Details Patient Name: Date of Service: [name] 09/11/2019 Medical Record Number: [id number] Patient Account Number: [id number] Date of Birth/Sex: Treating RN: May 06, 1923 (86 y.o. [name] Primary Care Provider: Sherrie Mustache Other Clinician: Referring Provider: Treating Provider/Extender: Heber [place] in Treatment: 6 Diagnosis Coding ICD-10 Codes Code Description [phone] Type 2 diabetes mellitus with foot ulcer L97.514 Non-pressure chronic ulcer of other part of right foot with necrosis of bone E11.40 Type 2 diabetes mellitus with diabetic neuropathy, unspecified Facility Procedures CPT4 Code: [id number] Description: 99213 - WOUND CARE VISIT-LEV 3 EST PT Modifier: Quantity: 1 Physician Procedures : CPT4 Code Description Modifier [id number] - WC PHYS LEVEL 3 - EST PT 1 ICD-10 Diagnosis Description E11.621 Type 2 diabetes mellitus with foot ulcer Quantity: Electronic Signature(s) Signed: 09/11/2019 5:12:22 PM By: Levan Hurst RN, BSN Signed: 09/14/2019 4:57:23 PM By: Tobi Bastos MD, MBA Previous Signature: 09/11/2019 11:23:22 AM Version By: Tobi Bastos MD, MBA Entered By: Levan Hurst on 09/11/2019 11:43:17

## 2019-09-15 ENCOUNTER — Encounter: Payer: Self-pay | Admitting: Physician Assistant

## 2019-09-15 ENCOUNTER — Other Ambulatory Visit: Payer: Self-pay

## 2019-09-15 ENCOUNTER — Ambulatory Visit (HOSPITAL_COMMUNITY)
Admission: RE | Admit: 2019-09-15 | Discharge: 2019-09-15 | Disposition: A | Discharge status: Home / Self Care | Payer: Medicare Other | Source: Ambulatory Visit | Attending: Vascular Surgery | Admitting: Vascular Surgery

## 2019-09-15 ENCOUNTER — Ambulatory Visit (INDEPENDENT_AMBULATORY_CARE_PROVIDER_SITE_OTHER): Payer: Medicare Other | Admitting: Physician Assistant

## 2019-09-15 VITALS — BP 137/82 | HR 80 | Temp 97.2°F | Resp 18 | Ht 70.0 in | Wt 140.0 lb

## 2019-09-15 DIAGNOSIS — C8581 Other specified types of non-Hodgkin lymphoma, lymph nodes of head, face, and neck: Secondary | ICD-10-CM | POA: Diagnosis not present

## 2019-09-15 DIAGNOSIS — L97522 Non-pressure chronic ulcer of other part of left foot with fat layer exposed: Secondary | ICD-10-CM | POA: Diagnosis not present

## 2019-09-15 DIAGNOSIS — I739 Peripheral vascular disease, unspecified: Secondary | ICD-10-CM

## 2019-09-15 DIAGNOSIS — L97509 Non-pressure chronic ulcer of other part of unspecified foot with unspecified severity: Secondary | ICD-10-CM | POA: Diagnosis not present

## 2019-09-15 DIAGNOSIS — R2681 Unsteadiness on feet: Secondary | ICD-10-CM | POA: Diagnosis not present

## 2019-09-15 DIAGNOSIS — E11621 Type 2 diabetes mellitus with foot ulcer: Secondary | ICD-10-CM | POA: Diagnosis not present

## 2019-09-15 DIAGNOSIS — E1142 Type 2 diabetes mellitus with diabetic polyneuropathy: Secondary | ICD-10-CM | POA: Diagnosis not present

## 2019-09-15 DIAGNOSIS — L97519 Non-pressure chronic ulcer of other part of right foot with unspecified severity: Secondary | ICD-10-CM | POA: Diagnosis not present

## 2019-09-15 DIAGNOSIS — E1151 Type 2 diabetes mellitus with diabetic peripheral angiopathy without gangrene: Secondary | ICD-10-CM | POA: Diagnosis not present

## 2019-09-15 DIAGNOSIS — C61 Malignant neoplasm of prostate: Secondary | ICD-10-CM | POA: Diagnosis not present

## 2019-09-15 DIAGNOSIS — L97514 Non-pressure chronic ulcer of other part of right foot with necrosis of bone: Secondary | ICD-10-CM | POA: Diagnosis not present

## 2019-09-15 NOTE — Progress Notes (Signed)
Pt changed their mind and they will be having procedure done on Wed. 09/20/19

## 2019-09-15 NOTE — Progress Notes (Signed)
Established Critical Limb Ischemia Patient   History of Present Illness   [name] is a 86 y.o. (1924-04-17) male who presents due to nonhealing wound of right great toe.  His daughter is present today and is the primary historian.  Great toe wound was first noticed in November 2020.  This wound healed over the course of several months however in February of this year the ulceration returned.  Over the past 1 week the daughter states that it looks much worse.  He has been seen by [facility] and has received 2 weeks of doxycycline and just finished another 4 weeks of Keflex without much improvement.  A bone biopsy was inconclusive however demonstrated some bacterial growth.  The patient and daughter are worried that his toe will continue to worsen.  He is seen in a wheelchair today however has some mobility with a walker.  He is on Eliquis.  Surgical history significant for left lower extremity arteriogram 2 years ago and left great toe amp which has since healed.  The patient's PMH, PSH, SH, and FamHx were reviewed and are unchanged from prior visit.  Current Outpatient Medications  Medication Sig Dispense Refill  . abiraterone acetate (ZYTIGA) 250 MG tablet Take 250 mg by mouth daily. Take on an empty stomach 1 hour before or 2 hours after a meal     . acetaminophen (TYLENOL) 500 MG tablet Take 500-1,000 mg by mouth every 8 (eight) hours as needed for mild pain, fever or headache.     . albuterol (PROVENTIL) (2.5 MG/3ML) 0.083% nebulizer solution Take 2.5 mg by nebulization every 6 (six) hours as needed for wheezing or shortness of breath.    Marland Kitchen albuterol (VENTOLIN HFA) 108 (90 Base) MCG/ACT inhaler Inhale into the lungs every 6 (six) hours as needed for wheezing or shortness of breath.    Marland Kitchen apixaban (ELIQUIS) 5 MG TABS tablet Take 5 mg by mouth 2 (two) times daily.    . blood glucose meter kit and supplies Dispense based on patient and insurance preference. Use four times  daily as directed. (FOR ICD-10 E10.9, E11.9). 1 each 0  . Calcium Polycarbophil (FIBER-CAPS PO) 3 by mouth in the morning and 2 by mouth in the evening    . cephALEXin (KEFLEX) 500 MG capsule     . Cholecalciferol (VITAMIN D3) 1000 units CAPS Take 1,000 Units by mouth every morning.     . clotrimazole-betamethasone (LOTRISONE) cream Apply 1 application topically 2 (two) times daily as needed.     . Cranberry 500 MG TABS Take 1 tablet by mouth daily.    Marland Kitchen denosumab (XGEVA) 120 MG/1.7ML SOLN injection Inject 120 mg into the skin every 30 (thirty) days.    Marland Kitchen diltiazem (CARDIZEM) 60 MG tablet Take 60 mg by mouth 2 (two) times daily.    . diphenhydrAMINE-APAP, sleep, (TYLENOL PM EXTRA STRENGTH PO) Take 1 tablet by mouth at bedtime.     . donepezil (ARICEPT) 10 MG tablet Take 1 tablet by mouth once daily 90 tablet 0  . feeding supplement, ENSURE ENLIVE, (ENSURE ENLIVE) LIQD Take 237 mLs by mouth daily. CHOCOLATE     . fluticasone (FLONASE) 50 MCG/ACT nasal spray Place 2 sprays into both nostrils as needed.     . furosemide (LASIX) 20 MG tablet Take 20 mg by mouth daily as needed.     Marland Kitchen glipiZIDE (GLUCOTROL XL) 5 MG 24 hr tablet Take 5 mg by mouth daily with breakfast.     .  glucose blood (EASYMAX TEST) test strip Use as instructed 100 each 12  . Lancets 28G MISC 1 kit by Does not apply route every morning. 100 each 1  . leuprolide (LUPRON) 11.25 MG KIT injection Inject into the muscle every 6 (six) months.     Marland Kitchen lisinopril (ZESTRIL) 20 MG tablet Take 1 tablet by mouth once daily 90 tablet 3  . metFORMIN (GLUCOPHAGE) 850 MG tablet Take 850 mg by mouth 2 (two) times daily with a meal.     . mirtazapine (REMERON) 15 MG tablet Take 1 tablet (15 mg total) by mouth at bedtime. 30 tablet 0  . Multiple Vitamin (MULTIVITAMIN WITH MINERALS) TABS tablet Take 1 tablet by mouth every morning.    Marland Kitchen omeprazole (PRILOSEC) 40 MG capsule Take 40 mg by mouth daily.   3  . predniSONE (DELTASONE) 5 MG tablet Take 1/2  (one-half) tablet by mouth twice daily 90 tablet 0  . Probiotic Product (PROBIOTIC DAILY PO) Take 1 tablet by mouth daily. Every morning    . Simethicone 125 MG CAPS Take 1 capsule by mouth as needed.    . trimethoprim (TRIMPEX) 100 MG tablet Take 100 mg by mouth daily.      No current facility-administered medications for this visit.    REVIEW OF SYSTEMS (negative unless checked):   Cardiac:  []  Chest pain or chest pressure? []  Shortness of breath upon activity? []  Shortness of breath when lying flat? []  Irregular heart rhythm?  Vascular:  []  Pain in calf, thigh, or hip brought on by walking? []  Pain in feet at night that wakes you up from your sleep? []  Blood clot in your veins? []  Leg swelling?  Pulmonary:  []  Oxygen at home? []  Productive cough? []  Wheezing?  Neurologic:  []  Sudden weakness in arms or legs? []  Sudden numbness in arms or legs? []  Sudden onset of difficult speaking or slurred speech? []  Temporary loss of vision in one eye? []  Problems with dizziness?  Gastrointestinal:  []  Blood in stool? []  Vomited blood?  Genitourinary:  []  Burning when urinating? []  Blood in urine?  Psychiatric:  []  Major depression  Hematologic:  []  Bleeding problems? []  Problems with blood clotting?  Dermatologic:  []  Rashes or ulcers?  Constitutional:  []  Fever or chills?  Ear/Nose/Throat:  []  Change in hearing? []  Nose bleeds? []  Sore throat?  Musculoskeletal:  []  Back pain? []  Joint pain? []  Muscle pain?   Physical Examination   Vitals:   09/15/19 1428  BP: 137/82  Pulse: 80  Resp: 18  Temp: (!) 97.2 F (36.2 C)  TempSrc: Temporal  SpO2: 100%  Weight: 140 lb (63.5 kg)  Height: 5' 10"  (1.778 m)   *Body mass index is 20.09 kg/m.  General:  WDWN in NAD; vital signs documented above Gait: Not observed HENT: WNL, normocephalic Pulmonary: normal non-labored breathing , without Rales, rhonchi,  wheezing Cardiac: regular HR Abdomen: soft, NT,  no masses Skin: without rashes Vascular Exam/Pulses: Palpable left femoral pulse DP absent absent  PT absent absent   Extremities: Small eschar medial great toe; great toe tip with ulceration with necrotic skin edges no drainage with manipulation; no surrounding erythema Musculoskeletal: no muscle wasting or atrophy  Neurologic: A&O X 3;  No focal weakness or paresthesias are detected Psychiatric:  The pt has Normal affect.  Non-Invasive Vascular Imaging    ABI   Incompressible tibial vessels bilaterally   Medical Decision Making   [name] is a 86 y.o. male who  presents with nonhealing wound of right great toe   Right great toe ulceration nonhealing despite several rounds of antibiotics and local wound care  ABI study demonstrates incompressible tibial vessels  Plan will be for aortogram with right lower extremity runoff and possible intervention  Risks and benefits of angiography were discussed with the patient and his daughter and they agree to proceed   Dagoberto Ligas PA-C [facility] Office: [phone]  Clinic MD: Case was discussed with Dr. Donzetta Matters who agrees with angiography

## 2019-09-19 ENCOUNTER — Other Ambulatory Visit (HOSPITAL_COMMUNITY)
Admission: RE | Admit: 2019-09-19 | Discharge: 2019-09-19 | Disposition: A | Discharge status: Home / Self Care | Payer: Medicare Other | Source: Ambulatory Visit | Attending: Hematology | Admitting: Hematology

## 2019-09-19 ENCOUNTER — Other Ambulatory Visit: Payer: Self-pay

## 2019-09-19 DIAGNOSIS — Z20822 Contact with and (suspected) exposure to covid-19: Secondary | ICD-10-CM | POA: Diagnosis not present

## 2019-09-19 DIAGNOSIS — Z01812 Encounter for preprocedural laboratory examination: Secondary | ICD-10-CM | POA: Diagnosis not present

## 2019-09-19 LAB — SARS CORONAVIRUS 2 (TAT 6-24 HRS): SARS Coronavirus 2: NEGATIVE

## 2019-09-20 ENCOUNTER — Other Ambulatory Visit: Payer: Self-pay

## 2019-09-20 ENCOUNTER — Encounter (HOSPITAL_COMMUNITY)
Admission: RE | Disposition: A | Discharge status: Home Health | Payer: Self-pay | Source: Home / Self Care | Attending: Vascular Surgery

## 2019-09-20 ENCOUNTER — Inpatient Hospital Stay (HOSPITAL_COMMUNITY)
Admission: RE | Admit: 2019-09-20 | Discharge: 2019-09-23 | DRG: 271 | Disposition: A | Discharge status: Home Health | Payer: Medicare Other | Attending: Vascular Surgery | Admitting: Vascular Surgery

## 2019-09-20 DIAGNOSIS — J9811 Atelectasis: Secondary | ICD-10-CM | POA: Diagnosis not present

## 2019-09-20 DIAGNOSIS — Z89412 Acquired absence of left great toe: Secondary | ICD-10-CM

## 2019-09-20 DIAGNOSIS — Z8572 Personal history of non-Hodgkin lymphomas: Secondary | ICD-10-CM

## 2019-09-20 DIAGNOSIS — Z85831 Personal history of malignant neoplasm of soft tissue: Secondary | ICD-10-CM

## 2019-09-20 DIAGNOSIS — I96 Gangrene, not elsewhere classified: Secondary | ICD-10-CM | POA: Diagnosis present

## 2019-09-20 DIAGNOSIS — D72829 Elevated white blood cell count, unspecified: Secondary | ICD-10-CM | POA: Diagnosis present

## 2019-09-20 DIAGNOSIS — I771 Stricture of artery: Secondary | ICD-10-CM | POA: Diagnosis present

## 2019-09-20 DIAGNOSIS — Z7952 Long term (current) use of systemic steroids: Secondary | ICD-10-CM

## 2019-09-20 DIAGNOSIS — Z9221 Personal history of antineoplastic chemotherapy: Secondary | ICD-10-CM

## 2019-09-20 DIAGNOSIS — I70235 Atherosclerosis of native arteries of right leg with ulceration of other part of foot: Secondary | ICD-10-CM | POA: Diagnosis not present

## 2019-09-20 DIAGNOSIS — Z8249 Family history of ischemic heart disease and other diseases of the circulatory system: Secondary | ICD-10-CM

## 2019-09-20 DIAGNOSIS — I4891 Unspecified atrial fibrillation: Secondary | ICD-10-CM | POA: Diagnosis not present

## 2019-09-20 DIAGNOSIS — R55 Syncope and collapse: Secondary | ICD-10-CM | POA: Diagnosis present

## 2019-09-20 DIAGNOSIS — E1152 Type 2 diabetes mellitus with diabetic peripheral angiopathy with gangrene: Secondary | ICD-10-CM | POA: Diagnosis present

## 2019-09-20 DIAGNOSIS — E11311 Type 2 diabetes mellitus with unspecified diabetic retinopathy with macular edema: Secondary | ICD-10-CM | POA: Diagnosis present

## 2019-09-20 DIAGNOSIS — Z79818 Long term (current) use of other agents affecting estrogen receptors and estrogen levels: Secondary | ICD-10-CM

## 2019-09-20 DIAGNOSIS — C7951 Secondary malignant neoplasm of bone: Secondary | ICD-10-CM | POA: Diagnosis present

## 2019-09-20 DIAGNOSIS — Z7902 Long term (current) use of antithrombotics/antiplatelets: Secondary | ICD-10-CM

## 2019-09-20 DIAGNOSIS — Z800 Family history of malignant neoplasm of digestive organs: Secondary | ICD-10-CM

## 2019-09-20 DIAGNOSIS — L8990 Pressure ulcer of unspecified site, unspecified stage: Secondary | ICD-10-CM | POA: Insufficient documentation

## 2019-09-20 DIAGNOSIS — L97511 Non-pressure chronic ulcer of other part of right foot limited to breakdown of skin: Secondary | ICD-10-CM | POA: Diagnosis present

## 2019-09-20 DIAGNOSIS — I951 Orthostatic hypotension: Secondary | ICD-10-CM | POA: Diagnosis not present

## 2019-09-20 DIAGNOSIS — I70221 Atherosclerosis of native arteries of extremities with rest pain, right leg: Principal | ICD-10-CM | POA: Diagnosis present

## 2019-09-20 DIAGNOSIS — Z7984 Long term (current) use of oral hypoglycemic drugs: Secondary | ICD-10-CM

## 2019-09-20 DIAGNOSIS — K219 Gastro-esophageal reflux disease without esophagitis: Secondary | ICD-10-CM | POA: Diagnosis present

## 2019-09-20 DIAGNOSIS — Z91048 Other nonmedicinal substance allergy status: Secondary | ICD-10-CM

## 2019-09-20 DIAGNOSIS — Z79899 Other long term (current) drug therapy: Secondary | ICD-10-CM

## 2019-09-20 DIAGNOSIS — Z85828 Personal history of other malignant neoplasm of skin: Secondary | ICD-10-CM

## 2019-09-20 DIAGNOSIS — I739 Peripheral vascular disease, unspecified: Secondary | ICD-10-CM

## 2019-09-20 DIAGNOSIS — Z9049 Acquired absence of other specified parts of digestive tract: Secondary | ICD-10-CM

## 2019-09-20 DIAGNOSIS — I119 Hypertensive heart disease without heart failure: Secondary | ICD-10-CM | POA: Diagnosis present

## 2019-09-20 DIAGNOSIS — R54 Age-related physical debility: Secondary | ICD-10-CM | POA: Diagnosis present

## 2019-09-20 DIAGNOSIS — Z8546 Personal history of malignant neoplasm of prostate: Secondary | ICD-10-CM

## 2019-09-20 DIAGNOSIS — D62 Acute posthemorrhagic anemia: Secondary | ICD-10-CM | POA: Diagnosis not present

## 2019-09-20 DIAGNOSIS — F0390 Unspecified dementia without behavioral disturbance: Secondary | ICD-10-CM | POA: Diagnosis present

## 2019-09-20 DIAGNOSIS — Z803 Family history of malignant neoplasm of breast: Secondary | ICD-10-CM

## 2019-09-20 DIAGNOSIS — Z7901 Long term (current) use of anticoagulants: Secondary | ICD-10-CM

## 2019-09-20 DIAGNOSIS — E11621 Type 2 diabetes mellitus with foot ulcer: Secondary | ICD-10-CM | POA: Diagnosis present

## 2019-09-20 DIAGNOSIS — Z881 Allergy status to other antibiotic agents status: Secondary | ICD-10-CM

## 2019-09-20 DIAGNOSIS — D472 Monoclonal gammopathy: Secondary | ICD-10-CM | POA: Diagnosis present

## 2019-09-20 DIAGNOSIS — Z9079 Acquired absence of other genital organ(s): Secondary | ICD-10-CM

## 2019-09-20 HISTORY — PX: PERIPHERAL VASCULAR ATHERECTOMY: CATH118256

## 2019-09-20 HISTORY — PX: ABDOMINAL AORTOGRAM W/LOWER EXTREMITY: CATH118223

## 2019-09-20 LAB — GLUCOSE, CAPILLARY
Glucose-Capillary: 92 mg/dL (ref 70–99)
Glucose-Capillary: 112 mg/dL — ABNORMAL HIGH (ref 70–99)
Glucose-Capillary: 104 mg/dL — ABNORMAL HIGH (ref 70–99)

## 2019-09-20 LAB — POCT I-STAT, CHEM 8
BUN: 19 mg/dL (ref 8–23)
Calcium, Ion: 1.52 mmol/L (ref 1.15–1.40)
Chloride: 105 mmol/L (ref 98–111)
Creatinine, Ser: 0.7 mg/dL (ref 0.61–1.24)
Glucose, Bld: 104 mg/dL — ABNORMAL HIGH (ref 70–99)
HCT: 33 % — ABNORMAL LOW (ref 39.0–52.0)
Hemoglobin: 11.2 g/dL — ABNORMAL LOW (ref 13.0–17.0)
Potassium: 4.4 mmol/L (ref 3.5–5.1)
Sodium: 141 mmol/L (ref 135–145)
TCO2: 26 mmol/L (ref 22–32)

## 2019-09-20 LAB — POCT ACTIVATED CLOTTING TIME: Activated Clotting Time: 224 seconds

## 2019-09-20 SURGERY — ABDOMINAL AORTOGRAM W/LOWER EXTREMITY
Anesthesia: LOCAL | Laterality: Right

## 2019-09-20 MED ORDER — NITROGLYCERIN IN D5W 200-5 MCG/ML-% IV SOLN
INTRAVENOUS | Status: AC
  Filled 2019-09-20: qty 250

## 2019-09-20 MED ORDER — HEPARIN SODIUM (PORCINE) 1000 UNIT/ML IJ SOLN
INTRAMUSCULAR | Status: AC
  Filled 2019-09-20: qty 1

## 2019-09-20 MED ORDER — HEPARIN (PORCINE) IN NACL 1000-0.9 UT/500ML-% IV SOLN
INTRAVENOUS | Status: DC | PRN
  Administered 2019-09-20 (×2): 500 mL

## 2019-09-20 MED ORDER — CLOPIDOGREL BISULFATE 75 MG PO TABS
300.0000 mg | ORAL_TABLET | Freq: Once | ORAL | Status: AC
  Administered 2019-09-20: 300 mg via ORAL

## 2019-09-20 MED ORDER — ACETAMINOPHEN 325 MG PO TABS
650.0000 mg | ORAL_TABLET | ORAL | Status: DC | PRN
  Administered 2019-09-20 – 2019-09-21 (×3): 650 mg via ORAL
  Filled 2019-09-20 (×2): qty 2

## 2019-09-20 MED ORDER — LIDOCAINE HCL (PF) 1 % IJ SOLN
INTRAMUSCULAR | Status: AC
  Filled 2019-09-20: qty 30

## 2019-09-20 MED ORDER — HEPARIN (PORCINE) IN NACL 1000-0.9 UT/500ML-% IV SOLN
INTRAVENOUS | Status: AC
  Filled 2019-09-20: qty 1000

## 2019-09-20 MED ORDER — VERAPAMIL HCL 2.5 MG/ML IV SOLN
INTRAVENOUS | Status: AC
  Filled 2019-09-20: qty 2

## 2019-09-20 MED ORDER — MIDAZOLAM HCL 2 MG/2ML IJ SOLN
INTRAMUSCULAR | Status: AC
  Filled 2019-09-20: qty 2

## 2019-09-20 MED ORDER — CLOPIDOGREL BISULFATE 75 MG PO TABS
75.0000 mg | ORAL_TABLET | Freq: Every day | ORAL | Status: DC
  Administered 2019-09-21 – 2019-09-23 (×3): 75 mg via ORAL
  Filled 2019-09-20 (×3): qty 1

## 2019-09-20 MED ORDER — MIDAZOLAM HCL 2 MG/2ML IJ SOLN
INTRAMUSCULAR | Status: DC | PRN
  Administered 2019-09-20: 0.5 mg via INTRAVENOUS

## 2019-09-20 MED ORDER — ACETAMINOPHEN 325 MG PO TABS
ORAL_TABLET | ORAL | Status: AC
  Filled 2019-09-20: qty 2

## 2019-09-20 MED ORDER — HEPARIN SODIUM (PORCINE) 1000 UNIT/ML IJ SOLN
INTRAMUSCULAR | Status: DC | PRN
  Administered 2019-09-20 (×2): 2000 [IU] via INTRAVENOUS
  Administered 2019-09-20: 7000 [IU] via INTRAVENOUS

## 2019-09-20 MED ORDER — FENTANYL CITRATE (PF) 100 MCG/2ML IJ SOLN
INTRAMUSCULAR | Status: AC
  Filled 2019-09-20: qty 2

## 2019-09-20 MED ORDER — SODIUM CHLORIDE 0.9 % WEIGHT BASED INFUSION: 1.0000 mL/kg/h | INTRAVENOUS | Status: AC

## 2019-09-20 MED ORDER — NITROGLYCERIN 1 MG/10 ML FOR IR/CATH LAB
INTRA_ARTERIAL | Status: DC | PRN
  Administered 2019-09-20 (×4): 200 ug via INTRA_ARTERIAL

## 2019-09-20 MED ORDER — LIDOCAINE HCL (PF) 1 % IJ SOLN
INTRAMUSCULAR | Status: DC | PRN
  Administered 2019-09-20: 20 mL via INTRADERMAL

## 2019-09-20 MED ORDER — SODIUM CHLORIDE 0.9 % IV SOLN: INTRAVENOUS | Status: DC

## 2019-09-20 MED ORDER — NITROGLYCERIN 1 MG/10 ML FOR IR/CATH LAB
INTRA_ARTERIAL | Status: AC
  Filled 2019-09-20: qty 10

## 2019-09-20 MED ORDER — VIPERSLIDE LUBRICANT OPTIME: TOPICAL | Status: DC | PRN

## 2019-09-20 MED ORDER — CLOPIDOGREL BISULFATE 75 MG PO TABS
75.0000 mg | ORAL_TABLET | Freq: Every day | ORAL | 11 refills | Status: DC
Start: 2019-09-20 — End: 2020-01-29

## 2019-09-20 MED ORDER — FENTANYL CITRATE (PF) 100 MCG/2ML IJ SOLN
INTRAMUSCULAR | Status: DC | PRN
  Administered 2019-09-20: 25 ug via INTRAVENOUS

## 2019-09-20 MED ORDER — CLOPIDOGREL BISULFATE 300 MG PO TABS
ORAL_TABLET | ORAL | Status: AC
  Filled 2019-09-20: qty 1

## 2019-09-20 SURGICAL SUPPLY — 32 items
BAG SNAP BAND KOVER 36X36 (MISCELLANEOUS) ×1 IMPLANT
BALLN COYOTE OTW 2.5X220X150 (BALLOONS) ×3
BALLN COYOTE OTW 3X120X150 (BALLOONS) ×3
BALLOON COYOTE OTW 2.5X220X150 (BALLOONS) IMPLANT
BALLOON COYOTE OTW 3X120X150 (BALLOONS) IMPLANT
CATH CXI SUPP 2.6F 150 ST (CATHETERS) ×1 IMPLANT
CATH OMNI FLUSH 5F 65CM (CATHETERS) ×1 IMPLANT
CATH TELEPORT (CATHETERS) ×1 IMPLANT
CATH TEMPO AQUA 5F 100CM (CATHETERS) ×1 IMPLANT
CLOSURE MYNX CONTROL 6F/7F (Vascular Products) ×1 IMPLANT
COVER DOME SNAP 22 D (MISCELLANEOUS) ×1 IMPLANT
CROWN STEALTH MICRO-30 1.25MM (CATHETERS) ×1 IMPLANT
DEVICE TORQUE .014-.018 (MISCELLANEOUS) IMPLANT
GUIDEWIRE ZILIENT 12G 014X300 (WIRE) ×2 IMPLANT
GUIDEWIRE ZILIENT 12G 018 (WIRE) ×1 IMPLANT
GUIDEWIRE ZILIENT 6G 014 (WIRE) ×1 IMPLANT
GUIDEWIRE ZILIENT 6G 018 (WIRE) ×1 IMPLANT
KIT ENCORE 26 ADVANTAGE (KITS) ×1 IMPLANT
KIT MICROPUNCTURE NIT STIFF (SHEATH) ×1 IMPLANT
KIT PV (KITS) ×3 IMPLANT
LUBRICANT VIPERSLIDE CORONARY (MISCELLANEOUS) ×1 IMPLANT
SHEATH HIGHFLEX ANSEL 6FRX55 (SHEATH) ×1 IMPLANT
SHEATH PINNACLE 5F 10CM (SHEATH) ×1 IMPLANT
SHEATH PINNACLE 6F 10CM (SHEATH) ×1 IMPLANT
SHEATH PROBE COVER 6X72 (BAG) ×1 IMPLANT
SYR MEDRAD MARK V 150ML (SYRINGE) ×1 IMPLANT
TORQUE DEVICE .014-.018 (MISCELLANEOUS) ×3
TRANSDUCER W/STOPCOCK (MISCELLANEOUS) ×3 IMPLANT
TRAY PV CATH (CUSTOM PROCEDURE TRAY) ×3 IMPLANT
WIRE BENTSON .035X145CM (WIRE) ×1 IMPLANT
WIRE ROSEN-J .035X260CM (WIRE) ×2 IMPLANT
WIRE VIPER WIRECTO 0.014 (WIRE) ×1 IMPLANT

## 2019-09-20 NOTE — Progress Notes (Signed)
Patient ID: [name], male   DOB: May 02, 1923, 86 y.o.   MRN: [id number] Called to see patient for evaluation and short stay.  He had undergone aortogram and posterior tibial intervention for critical limb ischemia earlier today.  There was some concern about groin hematoma.  Also patient was complaining of some back pain.  He had a bowel movement and became unresponsive following this.  Rapid response was called for evaluation.  He remained hemodynamically stable.  He currently is alert.  He denies abdominal or back pain.  His abdomen is soft.  He does not have any evidence of hematoma in his left groin.  He has easily palpable popliteal pulses bilaterally.  Due to his advanced age of 42 and the late hour of 7 PM, I discussed the situation with his daughter and of recommended that he be admitted overnight for observation.  I am not comfortable with him going home currently.  We will plan discharge in the morning assuming he remains stable tonight.

## 2019-09-20 NOTE — Progress Notes (Signed)
Client up to bedside commode and c/o of feeling dizzy; became unresponsive to verbal stimuli, responds to tactile stimuli ; back to bed; client states feels better, left groin soft and no visible bleeding; client dressed and up to wheelchair and states " I need to use bathroom" taken to bathroom via wheelchair; had large bowel movement and c/o "back hurting really bad"  Ivin Booty and I in bathroom with client the whole time he was in bathroom; client became unresponsive to verbal stimuli and responded to tactile stimuli and taken back to bed; rapid response nurse in; Dr Donnetta Hutching notified

## 2019-09-20 NOTE — Progress Notes (Signed)
Dr Carlis Abbott in; head of bed elevated and client states back pain decreased with raising head of bed; Dr Carlis Abbott checked client's groin

## 2019-09-20 NOTE — Progress Notes (Signed)
CRITICAL VALUE STICKER  CRITICAL VALUE: Calcium 1.52  RECEIVER (on-site recipient of call): Vascular Lab tech  DATE & TIME NOTIFIED: 09/20/2019 [id number]  MESSENGER (representative from lab):   MD NOTIFIED:  Dr. Carlis Abbott   TIME OF NOTIFICATION: [phone]  RESPONSE: telephone communication

## 2019-09-20 NOTE — H&P (Signed)
History and Physical Interval Note:  09/20/2019 10:08 AM  [name]  has presented today for surgery, with the diagnosis of pad.  The various methods of treatment have been discussed with the patient and family. After consideration of risks, benefits and other options for treatment, the patient has consented to  Procedure(s): ABDOMINAL AORTOGRAM W/LOWER EXTREMITY (N/A) as a surgical intervention.  The patient's history has been reviewed, patient examined, no change in status, stable for surgery.  I have reviewed the patient's chart and labs.  Questions were answered to the patient's satisfaction.    Aortogram, right leg arteriogram, non-healing great toe wound.  [name]  Established Critical Limb Ischemia Patient  History of Present Illness  [name] is a 86 y.o. (July 20, 1923) male who presents due to nonhealing wound of right great toe. His daughter is present today and is the primary historian. Great toe wound was first noticed in November 2020. This wound healed over the course of several months however in February of this year the ulceration returned. Over the past 1 week the daughter states that it looks much worse. He has been seen by [facility] and has received 2 weeks of doxycycline and just finished another 4 weeks of Keflex without much improvement. A bone biopsy was inconclusive however demonstrated some bacterial growth. The patient and daughter are worried that his toe will continue to worsen. He is seen in a wheelchair today however has some mobility with a walker. He is on Eliquis. Surgical history significant for left lower extremity arteriogram 2 years ago and left great toe amp which has since healed.  The patient's PMH, PSH, SH, and FamHx were reviewed and are unchanged from prior visit.        Current Outpatient Medications  Medication Sig Dispense Refill  . abiraterone acetate (ZYTIGA) 250 MG tablet Take 250 mg by mouth daily. Take on an empty  stomach 1 hour before or 2 hours after a meal     . acetaminophen (TYLENOL) 500 MG tablet Take 500-1,000 mg by mouth every 8 (eight) hours as needed for mild pain, fever or headache.     . albuterol (PROVENTIL) (2.5 MG/3ML) 0.083% nebulizer solution Take 2.5 mg by nebulization every 6 (six) hours as needed for wheezing or shortness of breath.    Marland Kitchen albuterol (VENTOLIN HFA) 108 (90 Base) MCG/ACT inhaler Inhale into the lungs every 6 (six) hours as needed for wheezing or shortness of breath.    Marland Kitchen apixaban (ELIQUIS) 5 MG TABS tablet Take 5 mg by mouth 2 (two) times daily.    . blood glucose meter kit and supplies Dispense based on patient and insurance preference. Use four times daily as directed. (FOR ICD-10 E10.9, E11.9). 1 each 0  . Calcium Polycarbophil (FIBER-CAPS PO) 3 by mouth in the morning and 2 by mouth in the evening    . cephALEXin (KEFLEX) 500 MG capsule     . Cholecalciferol (VITAMIN D3) 1000 units CAPS Take 1,000 Units by mouth every morning.     . clotrimazole-betamethasone (LOTRISONE) cream Apply 1 application topically 2 (two) times daily as needed.     . Cranberry 500 MG TABS Take 1 tablet by mouth daily.    Marland Kitchen denosumab (XGEVA) 120 MG/1.7ML SOLN injection Inject 120 mg into the skin every 30 (thirty) days.    Marland Kitchen diltiazem (CARDIZEM) 60 MG tablet Take 60 mg by mouth 2 (two) times daily.    . diphenhydrAMINE-APAP, sleep, (TYLENOL PM EXTRA  STRENGTH PO) Take 1 tablet by mouth at bedtime.     . donepezil (ARICEPT) 10 MG tablet Take 1 tablet by mouth once daily 90 tablet 0  . feeding supplement, ENSURE ENLIVE, (ENSURE ENLIVE) LIQD Take 237 mLs by mouth daily. CHOCOLATE     . fluticasone (FLONASE) 50 MCG/ACT nasal spray Place 2 sprays into both nostrils as needed.     . furosemide (LASIX) 20 MG tablet Take 20 mg by mouth daily as needed.     Marland Kitchen glipiZIDE (GLUCOTROL XL) 5 MG 24 hr tablet Take 5 mg by mouth daily with breakfast.     . glucose blood (EASYMAX TEST) test strip Use as instructed  100 each 12  . Lancets 28G MISC 1 kit by Does not apply route every morning. 100 each 1  . leuprolide (LUPRON) 11.25 MG KIT injection Inject into the muscle every 6 (six) months.     Marland Kitchen lisinopril (ZESTRIL) 20 MG tablet Take 1 tablet by mouth once daily 90 tablet 3  . metFORMIN (GLUCOPHAGE) 850 MG tablet Take 850 mg by mouth 2 (two) times daily with a meal.     . mirtazapine (REMERON) 15 MG tablet Take 1 tablet (15 mg total) by mouth at bedtime. 30 tablet 0  . Multiple Vitamin (MULTIVITAMIN WITH MINERALS) TABS tablet Take 1 tablet by mouth every morning.    Marland Kitchen omeprazole (PRILOSEC) 40 MG capsule Take 40 mg by mouth daily.   3  . predniSONE (DELTASONE) 5 MG tablet Take 1/2 (one-half) tablet by mouth twice daily 90 tablet 0  . Probiotic Product (PROBIOTIC DAILY PO) Take 1 tablet by mouth daily. Every morning    . Simethicone 125 MG CAPS Take 1 capsule by mouth as needed.    . trimethoprim (TRIMPEX) 100 MG tablet Take 100 mg by mouth daily.      No current facility-administered medications for this visit.   REVIEW OF SYSTEMS (negative unless checked):  Cardiac:  ? Chest pain or chest pressure?  ? Shortness of breath upon activity?  ? Shortness of breath when lying flat?  ? Irregular heart rhythm?  Vascular:  ? Pain in calf, thigh, or hip brought on by walking?  ? Pain in feet at night that wakes you up from your sleep?  ? Blood clot in your veins?  ? Leg swelling?  Pulmonary:  ? Oxygen at home?  ? Productive cough?  ? Wheezing?  Neurologic:  ? Sudden weakness in arms or legs?  ? Sudden numbness in arms or legs?  ? Sudden onset of difficult speaking or slurred speech?  ? Temporary loss of vision in one eye?  ? Problems with dizziness?  Gastrointestinal:  ? Blood in stool?  ? Vomited blood?  Genitourinary:  ? Burning when urinating?  ? Blood in urine?  Psychiatric:  ? Major depression  Hematologic:  ? Bleeding problems?  ? Problems with blood clotting?  Dermatologic:  ?  Rashes or ulcers?  Constitutional:  ? Fever or chills?  Ear/Nose/Throat:  ? Change in hearing?  ? Nose bleeds?  ? Sore throat?  Musculoskeletal:  ? Back pain?  ? Joint pain?  ? Muscle pain?  Physical Examination      Vitals:   09/15/19 1428  BP: 137/82  Pulse: 80  Resp: 18  Temp: (!) 97.2 F (36.2 C)  TempSrc: Temporal  SpO2: 100%  Weight: 140 lb (63.5 kg)  Height: '5\' 10"'$  (1.778 m)   *Body mass index is 20.09 kg/m.  General: WDWN  in NAD; vital signs documented above  Gait: Not observed  HENT: WNL, normocephalic  Pulmonary: normal non-labored breathing , without Rales, rhonchi, wheezing  Cardiac: regular HR  Abdomen: soft, NT, no masses  Skin: without rashes  Vascular Exam/Pulses: Palpable left femoral pulse  DP absent absent  PT absent absent  Extremities: Small eschar medial great toe; great toe tip with ulceration with necrotic skin edges no drainage with manipulation; no surrounding erythema  Musculoskeletal: no muscle wasting or atrophy  Neurologic: A&O X 3; No focal weakness or paresthesias are detected  Psychiatric: The pt has Normal affect.  Non-Invasive Vascular Imaging  ABI  Incompressible tibial vessels bilaterally Medical Decision Making  RAE SUTCLIFFE is a 86 y.o. male who presents with nonhealing wound of right great toe  Right great toe ulceration nonhealing despite several rounds of antibiotics and local wound care  ABI study demonstrates incompressible tibial vessels  Plan will be for aortogram with right lower extremity runoff and possible intervention  Risks and benefits of angiography were discussed with the patient and his daughter and they agree to proceed Dagoberto Ligas PA-C  [facility]  Office: [phone]

## 2019-09-20 NOTE — Op Note (Signed)
Patient name: [name] MRN: [id number] DOB: [birth year] Sex: male  09/20/2019 Pre-operative Diagnosis: Critical limb ischemia of the right lower extremity with nonhealing wound  Post-operative diagnosis:  Same Surgeon:  Marty Heck, MD Procedure Performed: 1.  Ultrasound-guided access of left common femoral artery 2.  Aortogram including catheter selection of aorta 3.  Bilateral lower extremity arteriogram including selection of third order branches in the right lower extremity  4.  Right posterior tibial artery mechanical orbital atherectomy (1.25 mm CSI atherectomy catheter) 5.  Right posterior tibial artery angioplasty (2.5 mm and 3 mm coyote) 6.  58 minutes monitored conscious sedation 7.  Mynx closure of the left common femoral artery  Indications: 86 year old male that previously had been under the care of Dr. Oneida Alar that presented with new wound to his right great toe that has been nonhealing.  He was subsequently scheduled for lower extremity arteriogram and possible intervention after risk benefits discussed.  Findings:   Aortogram showed patent renal arteries bilaterally with patent aortoiliac segment bilaterally.  There was no evidence of flow-limiting stenosis in aortoiliac segment.  Very sluggish flow down both extremities from likely centralized process.  Right lower extremity arteriogram showed patent common femoral, profunda, SFA, above and below-knee popliteal artery.  Dominant runoff in the right lower extremity was through anterior tibial (that was much more brisk) that became very small at the level of the ankle and had minimal outflow into the foot.  Peroneal was patent but atretic.  The posterior tibial flow was much slower and the artery appeared to have multiple focal high-grade stenosis as well as a short chronic total occlusion in the distal segment and also was heavily calcified with only about half the plantar arch highlighting on initial  images.  Left lower extremity arteriogram showed a patent common femoral, profunda, SFA, above and below-knee popliteal artery, and apparent three-vessel runoff.  Again the left lower extremity vessels below the knee were heavily calcified and diseased and were not completely evaluated due to sluggish flow down both lower extremities.  Ultimately given dominant inflow was through the anterior tibial in the right lower extremity with no focal stenosis we left this alone.  Selected the posterior tibial where flow was more sluggish and was able to cross all the stenotic lesions as well as short chronic total occlusion.  Mechanical orbital atherectomy was then performed at low medium and high speeds down to the level of the ankle and this was angioplastied with 2.5 mm and 3 mm balloon.  Flow remained very sluggish down the posterior tibial artery but was patent.  This included after we injected nitroglycerin directly into the foot.  We had the same preserved plantar runoff that we had at beginning of the case.  Brisk PT signal in right foot.    Procedure:  The patient was identified in the holding area and taken to room 8.  The patient was then placed supine on the table and prepped and draped in the usual sterile fashion.  A time out was called.  Ultrasound was used to evaluate the left common femoral artery.  It was patent .  A digital ultrasound image was acquired.  A micropuncture needle was used to access the left common femoral artery under ultrasound guidance.  An 018 wire was advanced without resistance and a micropuncture sheath was placed.  The 018 wire was removed and a benson wire was placed.  The micropuncture sheath was exchanged for a 5 french sheath.  An omniflush catheter was advanced over the wire to the level of L-1.  An abdominal angiogram was obtained.  Next the catheter was pulled down and bilateral lower extremity runoff was obtained.  Given very sluggish flow down both extremities we  elected to select the right leg.  Ultimately a Omni Flush catheter with a Bentson wire was used to select the right iliac and ultimately we selected the right SFA.  I then advanced a long Rosen wire down to the popliteal artery and exchanged for a straight 100 cm flush catheter.  We then got dedicated tibial imaging of the right lower extremity.  As noted above the anterior tibial was dominant runoff into the foot although he has significant small vessel disease below the ankle.  The peroneal was atretic.  The posterior tibial had multiple focal high-grade stenosis and a short chronic total occlusion so elected to try and treat this to improve flow into the foot.  We then used a weighted Ziliant wire initially with 6 g and then a 12 g wire as well as a CXI catheter to select the posterior tibial artery.  Ultimately I could get my wire down into the foot but I could not get the 0.018 CXI catheter to track.  We then downsized to a 0.014 system with a weighted Ziliant wire 12 g and used TelePort 014 catheter to get catheter into the foot.  We confirmed with hand-injection that we were indeed in the true lumen.  We then exchanged for a Viper wire and gave additional nitroglycerin and over 800 mcg in the case.  We then used a 1.25 mm micro CSI catheter and a mechanical atherectomy was performed low medium high speeds of the posterior tibial artery throughout almost its entire segment.  I then angioplastied this with a 2.5 mm coyote balloon.  Another hand-injection showed very sluggish flow down the posterior tibial which is consistent with what his initial imaging showed.  I elected to put a catheter back down and then inject nitroglycerin directly into the foot to try and improve his outflow given we done atherectomy.  Also upsized to a 3 mm coyote and angioplastied the posterior tibial again.  Again the flow was very sluggish but it did not like there was anything as we could do and we had the same preserved runoff  from the beginning of the case.  He had a very good posterior tibial signal on exam.  We then exchanged for a short 6 French sheath in the left groin and other wires and catheters removed and a mynx closure device was deployed.  Plan: Patient will be loaded on Plavix.  Will arrange follow-up in 1 month with ABIs and right leg arterial duplex.  He is already going to wound care for his right great toe wound.  No signs of infection at this time.     Marty Heck, MD [facility] Office: [place]

## 2019-09-20 NOTE — Significant Event (Signed)
Rapid Response Event Note  Overview: Vagal Episode ?   Initial Focused Assessment: Called urgently to short stay, per staff they had assisted the patient to bathroom two times and both times patient was dizzy/lightheaded and felt weak. Per staff, patient also endorsed lower back pain and is s/p aortogram and posterior tibial intervention. The SS RN stated that he did have a small hematoma earlier that was aspirated. Upon arrival - patient was alert and oriented x 3, hard of hearing, able to follow commands, and all moves extremities. He no longer endorsed being lightheaded/dizzy - stated he had some very mild back discomfort but he has a history of chronic lower back pain. VS 117/69, HR 80 -AF, 100% on 2L [place], RR normal 18 - good breath sounds. Palpable pulses and groin is soft and non-tender - there is bruising around the closure device.   Interventions: -- No RRT Interventions  Plan of Care: -- Monitor VS -- Rest per on call VVS MD  Event Summary:  Call Time 1815 Arrival Time [place]  Ashyr Hedgepath R

## 2019-09-20 NOTE — Progress Notes (Signed)
Report called to RN for 4-E- 24 and client transferred via bed; daughter at bedside

## 2019-09-21 ENCOUNTER — Inpatient Hospital Stay (HOSPITAL_COMMUNITY): Payer: Medicare Other

## 2019-09-21 ENCOUNTER — Other Ambulatory Visit: Payer: Self-pay

## 2019-09-21 ENCOUNTER — Ambulatory Visit (HOSPITAL_COMMUNITY): Payer: Medicare Other | Admitting: Hematology

## 2019-09-21 ENCOUNTER — Observation Stay (HOSPITAL_COMMUNITY): Payer: Medicare Other

## 2019-09-21 ENCOUNTER — Ambulatory Visit (HOSPITAL_COMMUNITY): Payer: Medicare Other

## 2019-09-21 DIAGNOSIS — I951 Orthostatic hypotension: Secondary | ICD-10-CM | POA: Diagnosis not present

## 2019-09-21 DIAGNOSIS — J9811 Atelectasis: Secondary | ICD-10-CM | POA: Diagnosis not present

## 2019-09-21 DIAGNOSIS — D72829 Elevated white blood cell count, unspecified: Secondary | ICD-10-CM | POA: Diagnosis not present

## 2019-09-21 DIAGNOSIS — D649 Anemia, unspecified: Secondary | ICD-10-CM

## 2019-09-21 DIAGNOSIS — I480 Paroxysmal atrial fibrillation: Secondary | ICD-10-CM | POA: Diagnosis not present

## 2019-09-21 LAB — CBC
HCT: 27.4 % — ABNORMAL LOW (ref 39.0–52.0)
HCT: 27.9 % — ABNORMAL LOW (ref 39.0–52.0)
Hemoglobin: 9.3 g/dL — ABNORMAL LOW (ref 13.0–17.0)
Hemoglobin: 9.4 g/dL — ABNORMAL LOW (ref 13.0–17.0)
MCH: 33 pg (ref 26.0–34.0)
MCH: 32.3 pg (ref 26.0–34.0)
MCHC: 33.9 g/dL (ref 30.0–36.0)
MCHC: 33.7 g/dL (ref 30.0–36.0)
MCV: 97.2 fL (ref 80.0–100.0)
MCV: 95.9 fL (ref 80.0–100.0)
Platelets: 171 10*3/uL (ref 150–400)
Platelets: 172 10*3/uL (ref 150–400)
RBC: 2.82 MIL/uL — ABNORMAL LOW (ref 4.22–5.81)
RBC: 2.91 MIL/uL — ABNORMAL LOW (ref 4.22–5.81)
RDW: 12.9 % (ref 11.5–15.5)
RDW: 12.8 % (ref 11.5–15.5)
WBC: 9.8 10*3/uL (ref 4.0–10.5)
WBC: 10.8 10*3/uL — ABNORMAL HIGH (ref 4.0–10.5)
nRBC: 0 % (ref 0.0–0.2)
nRBC: 0 % (ref 0.0–0.2)

## 2019-09-21 LAB — BASIC METABOLIC PANEL
Anion gap: 7 (ref 5–15)
BUN: 15 mg/dL (ref 8–23)
CO2: 21 mmol/L — ABNORMAL LOW (ref 22–32)
Calcium: 9 mg/dL (ref 8.9–10.3)
Chloride: 110 mmol/L (ref 98–111)
Creatinine, Ser: 0.99 mg/dL (ref 0.61–1.24)
GFR calc Af Amer: >60 mL/min (ref >60)
GFR calc non Af Amer: >60 mL/min (ref >60)
Glucose, Bld: 219 mg/dL — ABNORMAL HIGH (ref 70–99)
Potassium: 3.6 mmol/L (ref 3.5–5.1)
Sodium: 138 mmol/L (ref 135–145)

## 2019-09-21 LAB — GLUCOSE, CAPILLARY
Glucose-Capillary: 202 mg/dL — ABNORMAL HIGH (ref 70–99)
Glucose-Capillary: 182 mg/dL — ABNORMAL HIGH (ref 70–99)
Glucose-Capillary: 124 mg/dL — ABNORMAL HIGH (ref 70–99)

## 2019-09-21 MED ORDER — DILTIAZEM HCL 60 MG PO TABS: 60.0000 mg | ORAL_TABLET | Freq: Two times a day (BID) | ORAL | Status: DC

## 2019-09-21 MED ORDER — DILTIAZEM HCL 60 MG PO TABS
60.0000 mg | ORAL_TABLET | Freq: Two times a day (BID) | ORAL | Status: DC
  Administered 2019-09-21 – 2019-09-23 (×5): 60 mg via ORAL
  Filled 2019-09-21 (×5): qty 1

## 2019-09-21 MED ORDER — INSULIN ASPART 100 UNIT/ML ~~LOC~~ SOLN
0.0000 [IU] | Freq: Three times a day (TID) | SUBCUTANEOUS | Status: DC
  Administered 2019-09-21 – 2019-09-22 (×4): 2 [IU] via SUBCUTANEOUS
  Administered 2019-09-22: 3 [IU] via SUBCUTANEOUS
  Administered 2019-09-23 (×2): 2 [IU] via SUBCUTANEOUS

## 2019-09-21 MED ORDER — SODIUM CHLORIDE 0.9 % IV SOLN: Freq: Once | INTRAVENOUS | Status: DC | PRN

## 2019-09-21 MED ORDER — ALBUTEROL SULFATE (2.5 MG/3ML) 0.083% IN NEBU: 2.5000 mg | INHALATION_SOLUTION | Freq: Four times a day (QID) | RESPIRATORY_TRACT | Status: DC | PRN

## 2019-09-21 MED ORDER — GLIPIZIDE ER 5 MG PO TB24
5.0000 mg | ORAL_TABLET | Freq: Every day | ORAL | Status: DC
  Administered 2019-09-22 – 2019-09-23 (×2): 5 mg via ORAL
  Filled 2019-09-21 (×3): qty 1

## 2019-09-21 MED ORDER — METOPROLOL TARTRATE 5 MG/5ML IV SOLN
2.5000 mg | INTRAVENOUS | Status: DC | PRN
  Administered 2019-09-21: 5 mg via INTRAVENOUS
  Filled 2019-09-21: qty 5

## 2019-09-21 MED ORDER — APIXABAN 5 MG PO TABS
5.0000 mg | ORAL_TABLET | Freq: Two times a day (BID) | ORAL | Status: DC
  Administered 2019-09-21 – 2019-09-23 (×4): 5 mg via ORAL
  Filled 2019-09-21 (×4): qty 1

## 2019-09-21 MED ORDER — POTASSIUM CHLORIDE 20 MEQ/15ML (10%) PO SOLN
40.0000 meq | Freq: Once | ORAL | Status: AC
  Administered 2019-09-21: 40 meq via ORAL
  Filled 2019-09-21: qty 30

## 2019-09-21 MED ORDER — CEPHALEXIN 250 MG PO CAPS
250.0000 mg | ORAL_CAPSULE | Freq: Two times a day (BID) | ORAL | Status: DC
  Administered 2019-09-21 – 2019-09-23 (×4): 250 mg via ORAL
  Filled 2019-09-21 (×5): qty 1

## 2019-09-21 MED ORDER — OXYCODONE-ACETAMINOPHEN 5-325 MG PO TABS
1.0000 | ORAL_TABLET | ORAL | Status: DC | PRN
  Administered 2019-09-21: 2 via ORAL
  Filled 2019-09-21: qty 2

## 2019-09-21 MED ORDER — ENSURE ENLIVE PO LIQD
237.0000 mL | Freq: Every day | ORAL | Status: DC
  Administered 2019-09-21 – 2019-09-23 (×3): 237 mL via ORAL

## 2019-09-21 MED ORDER — DONEPEZIL HCL 5 MG PO TABS
10.0000 mg | ORAL_TABLET | Freq: Every day | ORAL | Status: DC
  Administered 2019-09-21 – 2019-09-23 (×3): 10 mg via ORAL
  Filled 2019-09-21 (×3): qty 2

## 2019-09-21 MED ORDER — SODIUM CHLORIDE 0.9 % IV SOLN: INTRAVENOUS | Status: DC

## 2019-09-21 MED ORDER — TRIMETHOPRIM 100 MG PO TABS
100.0000 mg | ORAL_TABLET | Freq: Every day | ORAL | Status: DC
  Administered 2019-09-21 – 2019-09-22 (×2): 100 mg via ORAL
  Filled 2019-09-21 (×3): qty 1

## 2019-09-21 MED ORDER — MIRTAZAPINE 15 MG PO TABS
15.0000 mg | ORAL_TABLET | Freq: Every day | ORAL | Status: DC
  Administered 2019-09-21 – 2019-09-22 (×2): 15 mg via ORAL
  Filled 2019-09-21 (×2): qty 1

## 2019-09-21 NOTE — Evaluation (Signed)
Physical Therapy Evaluation Patient Details Name: [name] MRN: [id number] DOB: Aug 09, 1923 Today's Date: 09/21/2019   History of Present Illness  Pt is a 86 y.o male s/p abdominal aortogram with LE arteriogram and posterior tibial intervention. After procedure, pt had bowel movement and had epsiode of unresponsiveness, so was kept overnight.   Clinical Impression  Pt admitted secondary to problem above with deficits below. Pt requiring min A to stand at EOB, however, once standing, pt reporting increased dizziness and became shaky. Checked BP and BP at 79/53 mmHg. Notified RN. Anticipate pt will progress well once BP stable. Reports children are available to assist at home and has all necessary equipment. Will continue to follow acutely to maximize functional mobility independence and safety.     Follow Up Recommendations Home health PT;Supervision/Assistance - 24 hour(pending progression )    Equipment Recommendations  None recommended by PT    Recommendations for Other Services       Precautions / Restrictions Precautions Precautions: Fall;Other (comment) Precaution Comments: Watch BP  Restrictions Weight Bearing Restrictions: No      Mobility  Bed Mobility Overal bed mobility: Needs Assistance Bed Mobility: Supine to Sit;Sit to Supine     Supine to sit: Mod assist Sit to supine: Mod assist   General bed mobility comments: Mod A for LE assist and trunk assist. Also required assist with scooting hips to EOB.   Transfers Overall transfer level: Needs assistance Equipment used: Rolling walker (2 wheeled) Transfers: Sit to/from Stand Sit to Stand: Min assist;From elevated surface         General transfer comment: Min A for lift assist and steadying to stand. Upon standing, pt reporting dizziness and had increased shakiness. BP checked at was + for orthostatics. Further mobility deferred.   Ambulation/Gait                Stairs            Wheelchair  Mobility    Modified Rankin (Stroke Patients Only)       Balance Overall balance assessment: Needs assistance Sitting-balance support: Feet supported Sitting balance-Leahy Scale: Good     Standing balance support: Bilateral upper extremity supported;During functional activity Standing balance-Leahy Scale: Poor Standing balance comment: Reliant on UE support                              Pertinent Vitals/Pain Pain Assessment: Faces Faces Pain Scale: Hurts little more Pain Location: RLE  Pain Descriptors / Indicators: Grimacing;Guarding Pain Intervention(s): Limited activity within patient's tolerance;Monitored during session;Repositioned    Home Living Family/patient expects to be discharged to:: Private residence Living Arrangements: Children Available Help at Discharge: Family;Available 24 hours/day Type of Home: House Home Access: Ramped entrance     Home Layout: One level Home Equipment: Walker - 2 wheels;Bedside commode;Tub bench;Transport chair;Other (comment)(has bed alarm)      Prior Function Level of Independence: Needs assistance   Gait / Transfers Assistance Needed: Uses RW for ambulation. Requires supervision           Hand Dominance        Extremity/Trunk Assessment   Upper Extremity Assessment Upper Extremity Assessment: Generalized weakness    Lower Extremity Assessment Lower Extremity Assessment: Generalized weakness;RLE deficits/detail RLE Deficits / Details: Reporting RLE pain secondary to procedure    Cervical / Trunk Assessment Cervical / Trunk Assessment: Kyphotic  Communication   Communication: HOH  Cognition Arousal/Alertness: Awake/alert Behavior During  Therapy: WFL for tasks assessed/performed Overall Cognitive Status: Within Functional Limits for tasks assessed                                        General Comments General comments (skin integrity, edema, etc.): Discussed using transport chair  at home for increased safety.     Exercises     Assessment/Plan    PT Assessment Patient needs continued PT services  PT Problem List Cardiopulmonary status limiting activity;Decreased strength;Decreased balance;Decreased mobility;Decreased knowledge of use of DME;Decreased knowledge of precautions;Decreased activity tolerance       PT Treatment Interventions Gait training;DME instruction;Functional mobility training;Balance training;Therapeutic exercise;Therapeutic activities;Patient/family education    PT Goals (Current goals can be found in the Care Plan section)  Acute Rehab PT Goals Patient Stated Goal: to go home PT Goal Formulation: With patient Time For Goal Achievement: 10/05/19 Potential to Achieve Goals: Good    Frequency Min 3X/week   Barriers to discharge        Co-evaluation               AM-PAC PT "6 Clicks" Mobility  Outcome Measure Help needed turning from your back to your side while in a flat bed without using bedrails?: A Little Help needed moving from lying on your back to sitting on the side of a flat bed without using bedrails?: A Lot Help needed moving to and from a bed to a chair (including a wheelchair)?: A Lot Help needed standing up from a chair using your arms (e.g., wheelchair or bedside chair)?: A Little Help needed to walk in hospital room?: A Lot Help needed climbing 3-5 steps with a railing? : Total 6 Click Score: 13    End of Session Equipment Utilized During Treatment: Gait belt Activity Tolerance: Treatment limited secondary to medical complications (Comment)(+ orthostatics) Patient left: in bed;with call bell/phone within reach;with family/visitor present Nurse Communication: Mobility status;Other (comment)(+ orthostatics) PT Visit Diagnosis: Unsteadiness on feet (R26.81);Muscle weakness (generalized) (M62.81)    Time: [id number] PT Time Calculation (min) (ACUTE ONLY): 21 min   Charges:   PT Evaluation $PT Eval Moderate  Complexity: 1 Mod          [name], PT, DPT  Acute Rehabilitation Services  Pager: [phone] Office: [phone]   Rudean Hitt 09/21/2019, 1:46 PM

## 2019-09-21 NOTE — Progress Notes (Addendum)
Progress Note    09/21/2019 7:13 AM 1 Day Post-Op  Subjective:  Denies right leg pain and says his right leg feels okay this morning.   Afebrile HR 80's-110's afib [id number] systolic [id number] RA  Vitals:   09/21/19 0100 09/21/19 0401  BP:  112/70  Pulse: 94 (!) 56  Resp: 18 20  Temp:  98.1 F (36.7 C)  SpO2:  98%    Physical Exam: Cardiac:  irregular Lungs:  Non labored Incisions:  Left groin is soft without hematoma Extremities:  Left thigh with mild ecchymosis; brisk right AT/peroneal and left PT.    CBC    Component Value Date/Time   WBC 9.8 09/21/2019 0240   RBC 2.82 (L) 09/21/2019 0240   HGB 9.3 (L) 09/21/2019 0240   HCT 27.4 (L) 09/21/2019 0240   PLT 171 09/21/2019 0240   MCV 97.2 09/21/2019 0240   MCH 33.0 09/21/2019 0240   MCHC 33.9 09/21/2019 0240   RDW 12.9 09/21/2019 0240   LYMPHSABS 2.2 09/06/2019 1117   MONOABS 1.4 (H) 09/06/2019 1117   EOSABS 0.1 09/06/2019 1117   BASOSABS 0.0 09/06/2019 1117    BMET    Component Value Date/Time   NA 138 09/21/2019 0240   K 3.6 09/21/2019 0240   CL 110 09/21/2019 0240   CO2 21 (L) 09/21/2019 0240   GLUCOSE 219 (H) 09/21/2019 0240   BUN 15 09/21/2019 0240   CREATININE 0.99 09/21/2019 0240   CREATININE 1.14 07/27/2013 1004   CALCIUM 9.0 09/21/2019 0240   GFRNONAA >60 09/21/2019 0240   GFRNONAA 58 (L) 07/27/2013 1004   GFRAA >60 09/21/2019 0240   GFRAA 66 07/27/2013 1004    INR    Component Value Date/Time   INR 1.2 07/02/2018 1159   INR 2.0 04/07/2010 0821     Intake/Output Summary (Last 24 hours) at 09/21/2019 0713 Last data filed at 09/21/2019 0641 Gross per 24 hour  Intake --  Output 200 ml  Net -200 ml     Assessment:  86 y.o. male is s/p:  1.  Ultrasound-guided access of left common femoral artery 2.  Aortogram including catheter selection of aorta 3.  Bilateral lower extremity arteriogram including selection of third order branches in the right lower extremity  4.  Right posterior  tibial artery mechanical orbital atherectomy (1.25 mm CSI atherectomy catheter) 5.  Right posterior tibial artery angioplasty (2.5 mm and 3 mm coyote) 6.  58 minutes monitored conscious sedation 7.  Mynx closure of the left common femoral artery  1 Day Post-Op  Plan: -pt feeling better this morning.  He has brisk right AT/peroneal and left PT.  -right heel discolored and I floated his right heel off the bed.  -pt with acute surgical blood loss anemia-he is tolerating.  He has some mild bruising on his left thigh but left groin is soft.   -pt up to chair.  -eliquis held - will d/w Dr. Carlis Abbott when to restart.     Leontine Locket, PA-C [facility] [phone] 09/21/2019 7:13 AM  I have seen and evaluated the patient. I agree with the PA note as documented above. 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  Right DP, peroneal, PT signals.  Some heel discoloration that could be from embolization during atherectomy - limited outflow in foot as noted on pre-op imaging with small vessel disease.  Certainly a limb threatening situation and now optimized from vascular standpoint.  Will re-check another CBC this am and  then start eliquis and continue Plavix.  Has been stable overnight.  Hopefully discharge later today.  Will need short order follow-up in clinic in 2-3 weeks.  Marty Heck, MD [facility] [place] Office: [phone]

## 2019-09-21 NOTE — Progress Notes (Signed)
Orthostatic VS: RN attempted to redo OrthoVS after liter bolus. Orthostatic at sitting BP: 115/85 and HR 108. Patient standing and BP: 56/48 and HR of 163. Pt. Lightheaded and dizzy; unable to continue standing for total 3 minutes. Pt. Laid back down with symptom relief and BP rechecked and BP 109/61 and HR 110's. MD Carlis Abbott and MD R. Tamala Julian made aware.  Pt. Also very warm, red and face flushed. Temp taken 99.4 oral. Pt. Received tylenol within last 45 minutes. MD's made aware also.

## 2019-09-21 NOTE — Plan of Care (Signed)
?  Problem: Clinical Measurements: ?Goal: Ability to maintain clinical measurements within normal limits will improve ?Outcome: Progressing ?Goal: Will remain free from infection ?Outcome: Progressing ?Goal: Diagnostic test results will improve ?Outcome: Progressing ?  ?

## 2019-09-21 NOTE — Consult Note (Addendum)
[facility] Medical Consultation  BURL TAUZIN NGE:[id number] DOB: 04-06-1924 DOA: 09/20/2019 PCP: [name], NP   Requesting physician: Monica Martinez, MD Date of consultation: 09/21/2019 Reason for consultation: Orthostatic hypotension  Impression/Recommendations Orthostatic hypotension: Acute.     1. Nonhealing wound of the right great toe/Peripheral artery disease: Patient status post right posterior tibial intervention by Dr. Carlis Abbott on 6/2.  -Continue management per vascular surgery   2. Orthostatic hypotension: Acute.  Patient complaining of lightheadedness.  Initial symptoms thought to be vagal related following bowel movement.  Orthostatic vital signs significant for blood pressure of 115/85 with pulse 108 sitting, and dropped to 56/48 with standing with pulse 163.  Attempts have been made to to give the patient fluids without any improvement in symptoms.  Given patient age do not want to overload the patient given chest x-ray noting cardiomegaly.  Question if there is the possibility of underlying infection causing patient's symptoms.  -Trial abdominal binder.  Also can consider compression stockings, but patient may not tolerate this due to pain in his legs -Recheck orthostatic vital signs in a.m.  3. Leukocytosis: WBC elevated 10.8 on repeat blood work today and patient did feel warm.  Temperatures only documented up to 99.4 F, but inpatient of his age may be a sign of infection.  Chest x-ray otherwise appears to show no clear signs of infiltrate or edema. -Check blood cultures -Check urinalysis -Follow-up repeat CBC in a.m.  4. Normocytic anemia: Hemoglobin dropped following procedure to 9.3-> 9.4 from 11.2.  No clear signs of active bleeding noted on physical exam.  Otherwise stable. -Continue to monitor  5. Diabetes mellitus 2: Patient appears to be well controlled last hemoglobin A1c 5.1. -Continue current management  6. Atrial fibrillation on chronic  anticoagulation: Patient appears to be rate controlled at this time and was restarted on Eliquis. -Continue current medication regimen -Goal potassium 4 and magnesium 2   I will followup again tomorrow. Please contact me if I can be of assistance in the meanwhile. Thank you for this consultation.  Chief Complaint: Nonhealing wound right great toe  HPI:  [name] is a 86 y.o. male with medical history significant of atrial fibrillation, aspiration pneumonia, diabetes mellitus type 2, peripheral arterial disease s/p previous amputations, dementia, and prostate cancer who initially presented for an nonhealing wound on his right great toe.  Most of history is obtained from review of records along with the patient's daughter who is present at bedside.  Patient was reported to have reappearance of the wound of his right first great toe.  Attempts were made with the wound care center to treat the wound with 2 rounds of doxycycline and subsequently 4 weeks of Keflex without significant improvement.  His daughter had noticed recent darkening of the area  6 days ago, and he was seen in outpatient vascular surgery clinic.  On 6/2 patient underwent arteriogram with right tibial intervention for nonhealing wound.  Normally at home patient is able to ambulate with use of a walker.  The plan had been for the patient to be discharged home, but following the procedure had had 2 large bowel movements and had syncopal event.  Her daughter patient normally has 2-3 loose stools per day that has been unchanged.  Patient was seen to go into A. fib with RVR and had been given diltiazem with improvement in heart rates.  Despite IV fluid bolus and monitoring patient has continued to be orthostatic with complaints of dizziness. On admission  hemoglobin was noted to be 11.2, following procedure hemoglobin was 9.3->9.4.  Nursing staff had given patient Tylenol prior for complaints of leg pain, but reported that the patient felt  warm to the touch and temperatures at that time were 99.4 F.   Review of Systems  Constitutional: Positive for malaise/fatigue.  Cardiovascular: Negative for chest pain and leg swelling.  Musculoskeletal: Positive for myalgias.  Neurological: Positive for dizziness.  Psychiatric/Behavioral: Positive for memory loss.     Past Medical History:  Diagnosis Date  . Acute bronchitis   . Acute lower UTI 11/25/2017  . Amputation of toe of left foot ([place]) 07/30/2017, 08/27/2017    Great toe 07/30/2017 Second toe 08/27/2017   . Arthritis   . Aspiration pneumonia ([place]) 01/31/2018  . Atrial fibrillation ([place])    Dr. Harl Bowie- [facility] follows saw 11'14  . Bladder stones    tx. with oral meds and antibiotics, now surgery planned  . Bone metastases ([place]) 08/21/2015  . Cataracts, both eyes    surgery planned May 2015  . Community acquired pneumonia 04/16/2018  . Cystoid macular edema 04/21/2015    Right eye 04/21/2015   . Dehydration 04/16/2018  . Dementia without behavioral disturbance ([place]) 04/16/2018  . Diabetes mellitus without complication ([place])    Type II  . Diarrhea 11/25/2017  . Dyspnea    with activity  . Dysrhythmia    Afib  . Family history of breast cancer   . Family history of colon cancer   . Genetic testing 02/09/2017  . GERD (gastroesophageal reflux disease)   . 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 Margot Chimes, thus far without recurrence.   Marland Kitchen History of MRI of lumbar spine 03/05/2014  . Hypertension   . Hypertrophy of prostate with urinary obstruction   . Hypomagnesemia 03/16/2017  . Metabolic encephalopathy 16/01/9603  . Metastatic adenocarcinoma to prostate ([place]) 09/08/2013  . 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  NHL (non-Hodgkin's lymphoma) ([place]) 2005   Diffuse large B-cell lymphoma, clinically stage IIIA, CD20 positive, status post cervical lymph node biopsy 08/03/2003 on the left. PET scan was also positive in the spleen and small bowel region, but bone marrow aspiration and biopsy were negative. So he essentially had stage IIIAs. He received R-CHOP x6 cycles with CR established by PET scan criteria on 11/23/2003 with no evidence for relapse th  . Numbness 02/19/2017   Per [facility] new patient packet  . Osteomyelitis ([place]) 06/29/2017   toe of left foot   . PAD (peripheral artery disease) ([place]) 08/27/2017  . Polyneuropathy 02/19/2017  . Postural dizziness with presyncope 02/19/2017  . S/P TURP 08/10/2013  . Sepsis ([place]) 07/02/2018  . Skin cancer    Basal cell- face,head, neck,  arms, legs, Back  . Urinary frequency 02/19/2017   Past Surgical History:  Procedure Laterality Date  . ABDOMINAL AORTOGRAM W/LOWER EXTREMITY N/A 09/20/2019   Procedure: ABDOMINAL AORTOGRAM W/LOWER EXTREMITY;  Surgeon: Marty Heck, MD;  Location: [facility];  Service: Cardiovascular;  Laterality: N/A;  . AMPUTATION Left 06/29/2017   Procedure: AMPUTATION LEFT GREAT TOE;  Surgeon: [name], MD;  Location: [facility];  Service: Vascular;  Laterality: Left;  . AMPUTATION Left 08/27/2017   Procedure: AMPUTATION Left SECOND TOE;  Surgeon: Ruta Hinds  E, MD;  Location: [facility];  Service: Vascular;  Laterality: Left;  . CATARACT EXTRACTION Bilateral 2015  . CHOLECYSTECTOMY    . COLON RESECTION     small bowel  . CYSTOSCOPY WITH LITHOLAPAXY N/A 08/10/2013   Procedure: CYSTOSCOPY WITH LITHOLAPAXY WITH [name];  Surgeon: [name], MD;  Location: [facility];  Service: Urology;  Laterality: N/A;  . ESOPHAGEAL DILATION N/A 02/27/2016   Procedure: ESOPHAGEAL DILATION;  Surgeon: [name], MD;  [facility];  Service: Endoscopy;  Laterality: N/A;  . ESOPHAGEAL DILATION N/A 07/07/2018   Procedure:  ESOPHAGEAL DILATION;  Surgeon: [name], MD;  [facility];  Service: Endoscopy;  Laterality: N/A;  . ESOPHAGOGASTRODUODENOSCOPY N/A 02/27/2016   Procedure: ESOPHAGOGASTRODUODENOSCOPY (EGD);  Surgeon: [name], MD;  [facility];  Service: Endoscopy;  Laterality: N/A;  12:00  . ESOPHAGOGASTRODUODENOSCOPY N/A 07/07/2018   Procedure: ESOPHAGOGASTRODUODENOSCOPY (EGD);  Surgeon: [name], MD;  [facility];  Service: Endoscopy;  Laterality: N/A;  . INCISION AND DRAINAGE PERIRECTAL ABSCESS    . LOWER EXTREMITY ANGIOGRAPHY N/A 06/25/2017   Procedure: LOWER EXTREMITY ANGIOGRAPHY;  Surgeon: [name], MD;  Location: [facility];  Service: Cardiovascular;  Laterality: N/A;  . LYMPH NODE DISSECTION Left    '05-neck  . PERIPHERAL VASCULAR ATHERECTOMY Right 09/20/2019   Procedure: PERIPHERAL VASCULAR ATHERECTOMY;  Surgeon: Marty Heck, MD;  Location: [facility];  Service: Cardiovascular;  Laterality: Right;  Posterior tibial  . PORT-A-CATH REMOVAL    . PORTACATH PLACEMENT     insertion and removal -last chemotherapy 10 yrs ago  . TRANSURETHRAL RESECTION OF PROSTATE N/A 08/10/2013   Procedure: TRANSURETHRAL RESECTION OF THE PROSTATE WITH GYRUS INSTRUMENTS;  Surgeon: [name], MD;  [facility];  Service: Urology;  Laterality: N/A;   Social History:  reports that he has never smoked. He has never used smokeless tobacco. He reports that he does not drink alcohol or use drugs.  Allergies  Allergen Reactions  . Tape Other (See Comments)    SKIN IS VERY THIN AND WILL TEAR AND BRUISE VERY EASILY!!!!  . Augmentin [Amoxicillin-Pot Clavulanate] Rash and Other (See Comments)    Redness of skin   Family History  Problem Relation Age of Onset  . Heart attack Mother        died in her 4s  . Other Father 12       pneumonia - died  . Breast cancer Sister        dx in her 35s-60s  . Colon cancer Brother        dx in  his 58s  . Cancer Sister        TAH/BSO due to "male cancer"  . Breast cancer Daughter 35  . Breast cancer Daughter 34       DCIS - ATM VUS on [employer] 83 gene panel in 2018  . Breast cancer Daughter 53       DCIS - Negative on [employer] MyRisk 25 gene apnel in 2015  . Thyroid cancer Daughter 57       Medullary and Papillary  . Thyroid nodules Grandchild     Prior to Admission medications   Medication Sig Start Date End Date Taking? Authorizing Provider  abiraterone acetate (ZYTIGA) 250 MG tablet Take 250 mg by mouth daily before breakfast. Take on an empty stomach 1 hour before or 2 hours after a meal    Yes [provider]  acetaminophen (TYLENOL) 500 MG tablet Take 500-1,000 mg by mouth every 8 (eight) hours as needed for mild pain, fever or headache.    Yes [provider]  albuterol (PROVENTIL) (2.5 MG/3ML) 0.083% nebulizer solution Take 2.5 mg by nebulization every 6 (six) hours as needed for wheezing or shortness of breath.   Yes [provider]  albuterol (VENTOLIN HFA) 108 (90 Base) MCG/ACT inhaler Inhale into the lungs every 6 (six) hours as needed for wheezing or shortness of breath.   Yes [provider]  apixaban (ELIQUIS) 5 MG TABS tablet Take 5 mg by mouth 2 (two) times daily.   Yes [provider]  Calcium Polycarbophil (FIBER-CAPS PO) Take 2-3 capsules by mouth See admin instructions. Take 3 capsules by mouth in the morning & take 2 capsules by mouth in the evening.   Yes [provider]  Cholecalciferol (VITAMIN D3) 1000 units CAPS Take 1,000 Units by mouth daily.    Yes [provider]  clotrimazole-betamethasone (LOTRISONE) cream Apply 1 application topically 2 (two) times daily as needed (skin irriation.).    Yes [provider]  Cranberry 500 MG TABS Take 500 mg by mouth daily.    Yes [provider]  denosumab (XGEVA) 120 MG/1.7ML SOLN injection Inject 120 mg into the skin every 30 (thirty)  days.   Yes [provider]  diltiazem (CARDIZEM) 60 MG tablet Take 60 mg by mouth 2 (two) times daily.   Yes [provider]  diphenhydrAMINE-APAP, sleep, (TYLENOL PM EXTRA STRENGTH PO) Take 1 tablet by mouth at bedtime.    Yes [provider]  donepezil (ARICEPT) 10 MG tablet Take 1 tablet by mouth once daily Patient taking differently: Take 10 mg by mouth daily.  08/28/19  Yes Corum, Rex Kras, MD  feeding supplement, ENSURE ENLIVE, (ENSURE ENLIVE) LIQD Take 237 mLs by mouth daily. CHOCOLATE    Yes [provider]  fluticasone (FLONASE) 50 MCG/ACT nasal spray Place 2 sprays into both nostrils daily as needed (allergies.).  06/08/18  Yes [provider]  furosemide (LASIX) 20 MG tablet Take 20 mg by mouth daily as needed (retention/swelling.).  06/15/19  Yes [provider]  glipiZIDE (GLUCOTROL XL) 5 MG 24 hr tablet Take 5 mg by mouth daily with breakfast.  06/25/15  Yes [provider]  Leuprolide Acetate, 6 Month, (LUPRON DEPOT, 77-MONTH,) 45 MG injection Inject 45 mg into the muscle every 6 (six) months.   Yes [provider]  lisinopril (ZESTRIL) 20 MG tablet Take 1 tablet by mouth once daily Patient taking differently: Take 20 mg by mouth daily.  06/15/19  Yes Arnoldo Lenis, MD  metFORMIN (GLUCOPHAGE) 850 MG tablet Take 850 mg by mouth 2 (two) times daily with a meal.  03/23/17  Yes [provider]  mirtazapine (REMERON) 15 MG tablet Take 1 tablet (15 mg total) by mouth at bedtime. 04/18/19 08/24/48 Yes Corum, Rex Kras, MD  Multiple Vitamin (MULTIVITAMIN WITH MINERALS) TABS tablet Take 1 tablet by mouth daily.    Yes [provider]  predniSONE (DELTASONE) 5 MG tablet Take 1/2 (one-half) tablet by mouth twice daily Patient taking differently: Take 2.5 mg by mouth in the morning and at bedtime.  06/14/19  Yes Lockamy, Randi L, NP-C  Probiotic Product (PROBIOTIC DAILY PO) Take 1 tablet by mouth daily. Every morning    Yes [provider]  Simethicone 125 MG CAPS Take 125 mg by mouth at bedtime.    Yes  [provider]  trimethoprim (TRIMPEX) 100 MG tablet Take 100 mg by mouth at bedtime.  05/16/19  Yes [provider]  blood glucose meter kit and supplies Dispense based on patient and insurance preference. Use four times daily as directed. (FOR ICD-10 E10.9, E11.9). 03/30/19   Maryruth Hancock, MD  cephALEXin (KEFLEX) 500 MG capsule  08/28/19   [provider]  clopidogrel (PLAVIX) 75 MG tablet Take 1 tablet (75 mg total) by mouth daily. 09/20/19 09/19/20  Marty Heck, MD  glucose blood [facility] TEST) test strip Use as instructed 08/24/19   Maryruth Hancock, MD  Lancets 28G MISC 1 kit by Does not apply route every morning. 07/18/19   Maryruth Hancock, MD   Physical Exam:  Constitutional: Elderly male who appears to be resting in no acute distress at this time Vitals:   09/21/19 1635 09/21/19 1640 09/21/19 1645 09/21/19 1700  BP: 115/85 (!) 56/48 109/61 (!) 104/58  Pulse: 91 (!) 103 (!) 103 92  Resp: (!) 22 (!) 33 19 20  Temp:    99.4 F (37.4 C)  TempSrc:    Oral  SpO2: 99% 99% 98% 99%  Weight:      Height:       Eyes: PERRL, lids and conjunctivae normal ENMT: Mucous membranes are moist. Posterior pharynx clear of any exudate or lesions.  Neck: normal, supple, no masses, no thyromegaly.  No JVD appreciated. Respiratory: clear to auscultation bilaterally, no wheezing, no crackles. Normal respiratory effort. No accessory muscle use.  Cardiovascular: Irregular.  Positive systolic ejection murmur 3/6. Abdomen: no tenderness, no masses palpated. No hepatosplenomegaly. Bowel sounds positive.  Musculoskeletal: no clubbing / cyanosis.  Status post amputation of 2 toes of the left foot. Skin: Patient feels warm to the touch.  Necrotic skin changes of the tip of the right great toe. Neurologic: CN 2-12 grossly intact.  Able to move all extremities. Psychiatric: Alert and oriented  to person.  Normal mood.  Labs on Admission:  Basic Metabolic Panel: Recent Labs  Lab 09/20/19 0928 09/21/19 0240  NA 141 138  K 4.4 3.6  CL 105 110  CO2  --  21*  GLUCOSE 104* 219*  BUN 19 15  CREATININE 0.70 0.99  CALCIUM  --  9.0   Liver Function Tests: No results for input(s): AST, ALT, ALKPHOS, BILITOT, PROT, ALBUMIN in the last 168 hours. No results for input(s): LIPASE, AMYLASE in the last 168 hours. No results for input(s): AMMONIA in the last 168 hours. CBC: Recent Labs  Lab 09/20/19 0928 09/21/19 0240 09/21/19 0802  WBC  --  9.8 10.8*  HGB 11.2* 9.3* 9.4*  HCT 33.0* 27.4* 27.9*  MCV  --  97.2 95.9  PLT  --  171 172   Cardiac Enzymes: No results for input(s): CKTOTAL, CKMB, CKMBINDEX, TROPONINI in the last 168 hours. BNP: Invalid input(s): POCBNP CBG: Recent Labs  Lab 09/20/19 0840 09/20/19 1515 09/20/19 1756 09/21/19 0631 09/21/19 1732  GLUCAP 92 112* 104* 202* 182*    Radiological Exams on Admission: PERIPHERAL VASCULAR CATHETERIZATION  Result Date: 09/20/2019 Patient name: MOHAMADOU MACIVER MRN: [id number]        DOB: March 21, 1924          Sex: male  09/20/2019 Pre-operative Diagnosis: Critical limb ischemia of the right lower extremity with nonhealing wound Post-operative diagnosis:  Same Surgeon:  Marty Heck, MD Procedure Performed: 1.  Ultrasound-guided access of left common femoral artery 2.  Aortogram including catheter  selection of aorta 3.  Bilateral lower extremity arteriogram including selection of third order branches in the right lower extremity 4.  Right posterior tibial artery mechanical orbital atherectomy (1.25 mm CSI atherectomy catheter) 5.  Right posterior tibial artery angioplasty (2.5 mm and 3 mm coyote) 6.  58 minutes monitored conscious sedation 7.  Mynx closure of the left common femoral artery  Indications: 86 year old male that previously had been under the care of Dr. Oneida Alar that presented with new wound to his right great toe  that has been nonhealing.  He was subsequently scheduled for lower extremity arteriogram and possible intervention after risk benefits discussed.  Findings:  Aortogram showed patent renal arteries bilaterally with patent aortoiliac segment bilaterally.  There was no evidence of flow-limiting stenosis in aortoiliac segment.  Very sluggish flow down both extremities from likely centralized process.  Right lower extremity arteriogram showed patent common femoral, profunda, SFA, above and below-knee popliteal artery.  Dominant runoff in the right lower extremity was through anterior tibial (that was much more brisk) that became very small at the level of the ankle and had minimal outflow into the foot.  Peroneal was patent but atretic.  The posterior tibial flow was much slower and the artery appeared to have multiple focal high-grade stenosis as well as a short chronic total occlusion in the distal segment and also was heavily calcified with only about half the plantar arch highlighting on initial images.  Left lower extremity arteriogram showed a patent common femoral, profunda, SFA, above and below-knee popliteal artery, and apparent three-vessel runoff.  Again the left lower extremity vessels below the knee were heavily calcified and diseased and were not completely evaluated due to sluggish flow down both lower extremities.  Ultimately given dominant inflow was through the anterior tibial in the right lower extremity with no focal stenosis we left this alone.  Selected the posterior tibial where flow was more sluggish and was able to cross all the stenotic lesions as well as short chronic total occlusion.  Mechanical orbital atherectomy was then performed at low medium and high speeds down to the level of the ankle and this was angioplastied with 2.5 mm and 3 mm balloon.  Flow remained very sluggish down the posterior tibial artery but was patent.  This included after we injected nitroglycerin directly into  the foot.  We had the same preserved plantar runoff that we had at beginning of the case.  Brisk PT signal in right foot.              Procedure:  The patient was identified in the holding area and taken to room 8.  The patient was then placed supine on the table and prepped and draped in the usual sterile fashion.  A time out was called.  Ultrasound was used to evaluate the left common femoral artery.  It was patent .  A digital ultrasound image was acquired.  A micropuncture needle was used to access the left common femoral artery under ultrasound guidance.  An 018 wire was advanced without resistance and a micropuncture sheath was placed.  The 018 wire was removed and a benson wire was placed.  The micropuncture sheath was exchanged for a 5 french sheath.  An omniflush catheter was advanced over the wire to the level of L-1.  An abdominal angiogram was obtained.  Next the catheter was pulled down and bilateral lower extremity runoff was obtained.  Given very sluggish flow down both extremities we elected to select the  right leg.  Ultimately a Omni Flush catheter with a Bentson wire was used to select the right iliac and ultimately we selected the right SFA.  I then advanced a long Rosen wire down to the popliteal artery and exchanged for a straight 100 cm flush catheter.  We then got dedicated tibial imaging of the right lower extremity.  As noted above the anterior tibial was dominant runoff into the foot although he has significant small vessel disease below the ankle.  The peroneal was atretic.  The posterior tibial had multiple focal high-grade stenosis and a short chronic total occlusion so elected to try and treat this to improve flow into the foot.  We then used a weighted Ziliant wire initially with 6 g and then a 12 g wire as well as a CXI catheter to select the posterior tibial artery.  Ultimately I could get my wire down into the foot but I could not get the 0.018 CXI catheter to track.  We then  downsized to a 0.014 system with a weighted Ziliant wire 12 g and used TelePort 014 catheter to get catheter into the foot.  We confirmed with hand-injection that we were indeed in the true lumen.  We then exchanged for a Viper wire and gave additional nitroglycerin and over 800 mcg in the case.  We then used a 1.25 mm micro CSI catheter and a mechanical atherectomy was performed low medium high speeds of the posterior tibial artery throughout almost its entire segment.  I then angioplastied this with a 2.5 mm coyote balloon.  Another hand-injection showed very sluggish flow down the posterior tibial which is consistent with what his initial imaging showed.  I elected to put a catheter back down and then inject nitroglycerin directly into the foot to try and improve his outflow given we done atherectomy.  Also upsized to a 3 mm coyote and angioplastied the posterior tibial again.  Again the flow was very sluggish but it did not like there was anything as we could do and we had the same preserved runoff from the beginning of the case.  He had a very good posterior tibial signal on exam.  We then exchanged for a short 6 French sheath in the left groin and other wires and catheters removed and a mynx closure device was deployed.  Plan: Patient will be loaded on Plavix.  Will arrange follow-up in 1 month with ABIs and right leg arterial duplex.  He is already going to wound care for his right great toe wound.  No signs of infection at this time.     Marty Heck, MD [facility] Office: [place]  DG CHEST PORT 1 VIEW  Result Date: 09/21/2019 CLINICAL DATA:  Orthostatic hypotension. EXAM: PORTABLE CHEST 1 VIEW COMPARISON:  Radiograph 01/25/2019 FINDINGS: Upper normal heart size. Unchanged mediastinal contours with aortic atherosclerosis. No pulmonary edema. Minor bibasilar atelectasis. No pleural fluid or pneumothorax. Scoliotic curvature of the  spine versus positioning. No acute osseous abnormalities are seen. IMPRESSION: 1. Borderline cardiomegaly.  Aortic Atherosclerosis (ICD10-I70.0). 2. Minor bibasilar atelectasis. Electronically Signed   By: Keith Rake M.D.   On: 09/21/2019 15:53      Time spent: >45 minutes  [facility] Pager [phone]  If 7PM-7AM, please contact night-coverage [email or web address] Password [email or web address] 09/21/2019, 6:01 PM

## 2019-09-21 NOTE — Progress Notes (Signed)
86 year old male that had a right tibial intervention yesterday for nonhealing wound since November.  Ultimately was admitted overnight after he had a possible vagal event in recovery.  Ultimately his hemoglobins has been stable today on recheck around 9.  His groin looks okay.  He iIs not complaining of any back pain.  He has a palpable popliteal pulse and a good dorsalis pedis signal in the right foot.  He has remained orthostatic and has been symptomatic even after a liter of fluid.  I have asked the hospitalist to consult for any additional advice on optimization given his age and comorbidities.  He looks fairly debilitated to me but daughter states he was walking with a walker at home.  Marty Heck, MD [facility] Office: [place]

## 2019-09-21 NOTE — Progress Notes (Signed)
While working with PT patient had episode of orthostatic hypotension. BP from 106/64 to 79/53 after standing. Patient symptomatic with chills, dizziness and lightheadedness. PA made aware. 1 Liter bolus given. After bolus BP is 119/51.   RN attempting to ambulate with patient to continue with discharge plan. Sitting on side of bed patient HR now in 140's. HR recovers to baseline in 100's after 10 minutes; no intervention needed. BP dropping in 80's while asleep and normalizing with waking. MD made aware of patient complaints. Will hold off on anymore fluid; give tylenol and closely monitor. Will defer discharge until tomorrow morning pending no further complaints. Family agreeable to plan.

## 2019-09-21 NOTE — Discharge Summary (Addendum)
Discharge Summary    [name] [birth year] 86 y.o. male  [id number]  Admission Date: 09/20/2019  Discharge Date: 09/23/2019  Physician: Monica Martinez, MD  Admission Diagnosis: Vagal reaction [R55], orthostatic hypotension, PAD   HPI:   This is a 86 y.o. male who presents due to nonhealing wound of right great toe. His daughter is present today and is the primary historian. Great toe wound was first noticed in November 2020. This wound healed over the course of several months however in February of this year the ulceration returned. Over the past 1 week the daughter states that it looks much worse. He has been seen by [facility] and has received 2 weeks of doxycycline and just finished another 4 weeks of Keflex without much improvement. A bone biopsy was inconclusive however demonstrated some bacterial growth. The patient and daughter are worried that his toe will continue to worsen. He is seen in a wheelchair today however has some mobility with a walker. He is on Eliquis. Surgical history significant for left lower extremity arteriogram 2 years ago and left great toe amp which has since healed.  The patient's PMH, PSH, SH, and FamHx were reviewed and are unchanged from prior visit.   Hospital Course:  The patient was admitted to the hospital and taken to the [facility] lab on 09/20/2019 and underwent: 1.  Ultrasound-guided access of left common femoral artery 2.  Aortogram including catheter selection of aorta 3.  Bilateral lower extremity arteriogram including selection of third order branches in the right lower extremity  4.  Right posterior tibial artery mechanical orbital atherectomy (1.25 mm CSI atherectomy catheter) 5.  Right posterior tibial artery angioplasty (2.5 mm and 3 mm coyote) 6.  58 minutes monitored conscious sedation 7.  Mynx closure of the left common femoral artery    Findings:   Aortogram showed patent renal arteries bilaterally with patent  aortoiliac segment bilaterally.  There was no evidence of flow-limiting stenosis in aortoiliac segment.  Very sluggish flow down both extremities from likely centralized process.  Right lower extremity arteriogram showed patent common femoral, profunda, SFA, above and below-knee popliteal artery.  Dominant runoff in the right lower extremity was through anterior tibial (that was much more brisk) that became very small at the level of the ankle and had minimal outflow into the foot.  Peroneal was patent but atretic.  The posterior tibial flow was much slower and the artery appeared to have multiple focal high-grade stenosis as well as a short chronic total occlusion in the distal segment and also was heavily calcified with only about half the plantar arch highlighting on initial images.  Left lower extremity arteriogram showed a patent common femoral, profunda, SFA, above and below-knee popliteal artery, and apparent three-vessel runoff.  Again the left lower extremity vessels below the knee were heavily calcified and diseased and were not completely evaluated due to sluggish flow down both lower extremities.  Ultimately given dominant inflow was through the anterior tibial in the right lower extremity with no focal stenosis we left this alone.  Selected the posterior tibial where flow was more sluggish and was able to cross all the stenotic lesions as well as short chronic total occlusion.  Mechanical orbital atherectomy was then performed at low medium and high speeds down to the level of the ankle and this was angioplastied with 2.5 mm and 3 mm balloon.  Flow remained very sluggish down the posterior tibial artery but was patent.  This included after  we injected nitroglycerin directly into the foot.  We had the same preserved plantar runoff that we had at beginning of the case.  Brisk PT signal in right foot.  The pt tolerated the procedure well and was transported to the PACU in good condition.   Dr.  Donnetta Hutching was called to see patient for evaluation and short stay.  He had undergone aortogram and posterior tibial intervention for critical limb ischemia earlier today.  There was some concern about groin hematoma.  Also patient was complaining of some back pain.  He had a bowel movement and became unresponsive following this.  Rapid response was called for evaluation.  He remained hemodynamically stable.  He currently is alert.  He denies abdominal or back pain.  His abdomen is soft.  He does not have any evidence of hematoma in his left groin.  He has easily palpable popliteal pulses bilaterally.  Due to his advanced age of 32 and the late hour of 7 PM, I discussed the situation with his daughter and of recommended that he be admitted overnight for observation.  I am not comfortable with him going home currently.  We will plan discharge in the morning assuming he remains stable tonight.  On POD 1, pt was doing well.  He did have some discoloration of his right heel and his heel was floated.  He had brisk doppler signals in the right AT/peroneal and left PT.  His left groin was soft without hematoma.  He did have some mild ecchymosis on his left thigh.  Hgb was rechecked and was stable.  On POD 2, the patient had near syncopal episode when out of bed.  Check of vital signs revealed orthostatic hypotension.  Fluid resuscitation was carried out but he remained orthostatic.  The hospitalist service was consulted.  It was noted that he was taking prednisone at home and this had not been continued.  Solu-Cortef was initiated.  Physical therapy consultation was carried out. Eliquis resumed.  On postoperative day 3, his blood pressure was improved and he was restarted on his prednisone.  His lisinopril was discontinued.  Family was comfortable with discharge to home.  A mini taper of prednisone back to baseline dosing was prescribed.  He had good Doppler signals of both feet with stable left 1st toe  ulcer. CBC    Component Value Date/Time   WBC 12.5 (H) 09/22/2019 0327   RBC 2.74 (L) 09/22/2019 0327   HGB 9.0 (L) 09/22/2019 0327   HCT 27.1 (L) 09/22/2019 0327   PLT 164 09/22/2019 0327   MCV 98.9 09/22/2019 0327   MCH 32.8 09/22/2019 0327   MCHC 33.2 09/22/2019 0327   RDW 13.2 09/22/2019 0327   LYMPHSABS 2.2 09/06/2019 1117   MONOABS 1.4 (H) 09/06/2019 1117   EOSABS 0.1 09/06/2019 1117   BASOSABS 0.0 09/06/2019 1117    BMET    Component Value Date/Time   NA 140 09/22/2019 0327   K 4.9 09/22/2019 0327   CL 111 09/22/2019 0327   CO2 21 (L) 09/22/2019 0327   GLUCOSE 182 (H) 09/22/2019 0327   BUN 17 09/22/2019 0327   CREATININE 1.21 09/22/2019 0327   CREATININE 1.14 07/27/2013 1004   CALCIUM 9.1 09/22/2019 0327   GFRNONAA 51 (L) 09/22/2019 0327   GFRNONAA 58 (L) 07/27/2013 1004   GFRAA 59 (L) 09/22/2019 0327   GFRAA 66 07/27/2013 1004      Discharge Instructions    Discharge patient   Complete by: As directed  Discharge disposition: 01-Home or Self Care   Discharge patient date: 09/23/2019      Discharge Diagnosis:  Vagal reaction [R55]  Secondary Diagnosis: Patient Active Problem List   Diagnosis Date Noted  . Pressure injury of skin 09/22/2019  . Vagal reaction 09/20/2019  . Cerumen debris on tympanic membrane of both ears 08/24/2019  . Type 1 diabetes mellitus with diabetic neuropathy, unspecified ([place]) 02/22/2019  . Memory loss, short term 02/22/2019  . Sepsis secondary to UTI ([place]) 07/02/2018  . HCAP (healthcare-associated pneumonia) 07/02/2018  . CAP (community acquired pneumonia) 04/16/2018  . Dementia without behavioral disturbance ([place]) 04/16/2018  . Dehydration 04/16/2018  . Aspiration pneumonia ([place]) 01/31/2018  . Diarrhea 11/25/2017  . PAD (peripheral artery disease) ([place]) 08/27/2017  . Great toe amputation status, left 07/30/2017  . Osteomyelitis of toe of left foot ([place]) 06/29/2017  . Acute bronchitis with bronchospasm 03/16/2017  .  Type 2 diabetes mellitus ([place]) 03/16/2017  . Hypomagnesemia 03/16/2017  . Neuropathy associated with MGUS ([place]) 02/24/2017  . Postural dizziness with presyncope 02/19/2017  . Polyneuropathy 02/19/2017  . Numbness 02/19/2017  . Urinary frequency 02/19/2017  . Genetic testing 02/09/2017  . Family history of breast cancer   . Family history of colon cancer   . Other dysphagia 01/21/2016  . Bone metastases ([place]) 08/21/2015  . Prostate cancer ([place]) 09/08/2013  . Hypertrophy of prostate with urinary obstruction and other lower urinary tract symptoms (LUTS) 08/10/2013  . NHL (non-Hodgkin's lymphoma) ([place]) 08/18/2011  . 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 Long term (current) use of anticoagulants 07/18/2010  . Essential hypertension 10/04/2008  . ATRIAL FIBRILLATION 10/04/2008   Past Medical History:  Diagnosis Date  . Acute bronchitis   . Acute lower UTI 11/25/2017  . Amputation of toe of left foot ([place]) 07/30/2017, 08/27/2017    Great toe 07/30/2017 Second toe 08/27/2017   . Arthritis   . Aspiration pneumonia ([place]) 01/31/2018  . Atrial fibrillation ([place])    Dr. Harl Bowie- [facility] follows saw 11'14  . Bladder stones    tx. with oral meds and antibiotics, now surgery planned  . Bone metastases ([place]) 08/21/2015  . Cataracts, both eyes    surgery planned May 2015  . Community acquired pneumonia 04/16/2018  . Cystoid macular edema 04/21/2015    Right eye 04/21/2015   . Dehydration 04/16/2018  . Dementia without behavioral disturbance ([place]) 04/16/2018  . Diabetes mellitus without complication ([place])    Type II  . Diarrhea 11/25/2017  . Dyspnea    with activity  . Dysrhythmia    Afib  . Family history of breast cancer   . Family history of colon cancer   . Genetic testing 02/09/2017  . GERD (gastroesophageal reflux disease)   . 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 Margot Chimes, thus far without recurrence.   Marland Kitchen History of MRI of lumbar spine 03/05/2014  . Hypertension   . Hypertrophy of prostate with urinary obstruction   . Hypomagnesemia 03/16/2017  . Metabolic encephalopathy 68/02/5725  . Metastatic adenocarcinoma to prostate ([place]) 09/08/2013  . 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 NHL (non-Hodgkin's lymphoma) ([place]) 2005   Diffuse large B-cell lymphoma, clinically stage IIIA, CD20 positive, status post cervical lymph node biopsy 08/03/2003 on the left. PET  scan was also positive in the spleen and small bowel region, but bone marrow aspiration and biopsy were negative. So he essentially had stage IIIAs. He received R-CHOP x6 cycles with CR established by PET scan criteria on 11/23/2003 with no evidence for relapse th  . Numbness 02/19/2017   Per [facility] new patient packet  . Osteomyelitis ([place]) 06/29/2017   toe of left foot   . PAD (peripheral artery disease) ([place]) 08/27/2017  . Polyneuropathy 02/19/2017  . Postural dizziness with presyncope 02/19/2017  . S/P TURP 08/10/2013  . Sepsis ([place]) 07/02/2018  . Skin cancer    Basal cell- face,head, neck,  arms, legs, Back  . Urinary frequency 02/19/2017     Allergies as of 09/23/2019      Reactions   Tape Other (See Comments)   SKIN IS VERY THIN AND WILL TEAR AND BRUISE VERY EASILY!!!!   Augmentin [amoxicillin-pot Clavulanate] Rash, Other (See Comments)   Redness of skin      Medication List    STOP taking these medications   lisinopril 20 MG tablet Commonly known as: ZESTRIL   TYLENOL PM EXTRA STRENGTH PO     TAKE these medications   acetaminophen 500 MG tablet Commonly known as: TYLENOL Take 1 tablet (500 mg total) by mouth 3 (three) times daily. What changed:   how much to take  when to take this  reasons to take this    albuterol 108 (90 Base) MCG/ACT inhaler Commonly known as: VENTOLIN HFA Inhale into the lungs every 6 (six) hours as needed for wheezing or shortness of breath.   albuterol (2.5 MG/3ML) 0.083% nebulizer solution Commonly known as: PROVENTIL Take 2.5 mg by nebulization every 6 (six) hours as needed for wheezing or shortness of breath.   blood glucose meter kit and supplies Dispense based on patient and insurance preference. Use four times daily as directed. (FOR ICD-10 E10.9, E11.9).   cephALEXin 250 MG capsule Commonly known as: KEFLEX Take 1 capsule (250 mg total) by mouth 2 (two) times daily. What changed:   medication strength  how much to take  how to take this  when to take this   clopidogrel 75 MG tablet Commonly known as: Plavix Take 1 tablet (75 mg total) by mouth daily.   clotrimazole-betamethasone cream Commonly known as: LOTRISONE Apply 1 application topically 2 (two) times daily as needed (skin irriation.).   Cranberry 500 MG Tabs Take 500 mg by mouth daily.   denosumab 120 MG/1.7ML Soln injection Commonly known as: XGEVA Inject 120 mg into the skin every 30 (thirty) days.   diclofenac Sodium 1 % Gel Commonly known as: VOLTAREN Apply 2 g topically 4 (four) times daily.   diltiazem 60 MG tablet Commonly known as: CARDIZEM Take 60 mg by mouth 2 (two) times daily.   donepezil 10 MG tablet Commonly known as: ARICEPT Take 1 tablet by mouth once daily   EasyMax Test test strip Generic drug: glucose blood Use as instructed   Eliquis 5 MG Tabs tablet Generic drug: apixaban Take 5 mg by mouth 2 (two) times daily.   feeding supplement (ENSURE ENLIVE) Liqd Take 237 mLs by mouth daily. CHOCOLATE   FIBER-CAPS PO Take 2-3 capsules by mouth See admin instructions. Take 3 capsules by mouth in the morning & take 2 capsules by mouth in the evening.   fluticasone 50 MCG/ACT nasal spray Commonly known as: FLONASE Place 2 sprays into both nostrils daily as  needed (allergies.).   furosemide 20 MG tablet Commonly  known as: LASIX Take 20 mg by mouth daily as needed (retention/swelling.).   glipiZIDE 5 MG 24 hr tablet Commonly known as: GLUCOTROL XL Take 5 mg by mouth daily with breakfast.   Lancets 28G Misc 1 kit by Does not apply route every morning.   Lupron Depot (32-Month) 45 MG injection Generic drug: Leuprolide Acetate (6 Month) Inject 45 mg into the muscle every 6 (six) months.   metFORMIN 850 MG tablet Commonly known as: GLUCOPHAGE Take 850 mg by mouth 2 (two) times daily with a meal.   mirtazapine 15 MG tablet Commonly known as: Remeron Take 1 tablet (15 mg total) by mouth at bedtime.   multivitamin with minerals Tabs tablet Take 1 tablet by mouth daily.   predniSONE 5 MG tablet Commonly known as: DELTASONE Take 5 mg tablet by mouth twice a day for 3 days then resume home dose of 2.5 mg twice a day. What changed: additional instructions   PROBIOTIC DAILY PO Take 1 tablet by mouth daily. Every morning   senna 8.6 MG Tabs tablet Commonly known as: SENOKOT Take 1 tablet (8.6 mg total) by mouth 2 (two) times daily.   Simethicone 125 MG Caps Take 125 mg by mouth at bedtime.   trimethoprim 100 MG tablet Commonly known as: TRIMPEX Take 100 mg by mouth at bedtime.   Vitamin D3 25 MCG (1000 UT) Caps Take 1,000 Units by mouth daily.   Zytiga 250 MG tablet Generic drug: abiraterone acetate Take 250 mg by mouth daily before breakfast. Take on an empty stomach 1 hour before or 2 hours after a meal     ASK your doctor about these medications   magnesium hydroxide 400 MG/5 ML Susp Commonly known as: MILK OF MAGNESIA Take 30 mLs by mouth once for 1 dose. Ask about: Should I take this medication?         Instructions:  [facility]  Discharge Instructions  Lower Extremity Angiogram; Angioplasty/Stenting  Please refer to the following instructions for your post-procedure care.  Your surgeon or physician assistant will discuss any changes with you.  Activity  Avoid lifting more than 8 pounds (1 gallons of milk) for 72 hours (3 days) after your procedure. You may walk as much as you can tolerate. It's OK to drive after 72 hours.  Bathing/Showering  You may shower the day after your procedure. If you have a bandage, you may remove it at 24- 48 hours. Clean your incision site with mild soap and water. Pat the area dry with a clean towel.  Diet  Resume your pre-procedure diet. There are no special food restrictions following this procedure. All patients with peripheral vascular disease should follow a low fat/low cholesterol diet. In order to heal from your surgery, it is CRITICAL to get adequate nutrition. Your body requires vitamins, minerals, and protein. Vegetables are the best source of vitamins and minerals. Vegetables also provide the perfect balance of protein. Processed food has little nutritional value, so try to avoid this.  Medications  Resume taking all of your medications unless your doctor tells you not to. If your incision is causing pain, you may take over-the-counter pain relievers such as acetaminophen (Tylenol)  Follow Up  Follow up will be arranged at the time of your procedure. You may have an office visit scheduled or may be scheduled for surgery. Ask your surgeon if you have any questions.  Please call us immediately for any of the following conditions: .Severe or worsening  pain your legs or feet at rest or with walking. .Increased pain, redness, drainage at your groin puncture site. .Fever of 101 degrees or higher. .If you have any mild or slow bleeding from your puncture site: lie down, apply firm constant pressure over the area with a piece of gauze or a clean wash cloth for 30 minutes- no peeking!, call 911 right away if you are still bleeding after 30 minutes, or if the bleeding is heavy and unmanageable.  Reduce your risk factors of  vascular disease:  . Stop smoking. If you would like help call [facility] at 1-800-QUIT-NOW 279-585-9500) or [place] at 352-410-6413. . Manage your cholesterol . Maintain a desired weight . Control your diabetes . Keep your blood pressure down .  If you have any questions, please call the office at [phone]  Float heels off of bed when laying in bed to help prevent pressure sores.  Prescriptions given: 1.  Plavix 33m daily  Disposition: home with daughter  Patient's condition: is Good  Follow up: 1. Dr. CCarlis Abbottin 2 weeks - no studies needed for this visit.   SLeontine Locket PA-C [facility] [phone]/09/2019  9:25 AM

## 2019-09-21 NOTE — Plan of Care (Signed)
  Problem: Education: Goal: Knowledge of General Education information will improve Description Including pain rating scale, medication(s)/side effects and non-pharmacologic comfort measures Outcome: Progressing   

## 2019-09-21 NOTE — Progress Notes (Signed)
[name], Foster ([id number]) Visit Report for 09/11/2019 Arrival Information Details Patient Name: Date of Service: [name] 09/11/2019 10:30 A M Medical Record Number: [id number] Patient Account Number: [id number] Date of Birth/Sex: Treating RN: [name] Primary Care [name]: [name] Other Clinician: Referring [name]: Treating [name]/Extender: [name] in Treatment: 6 Visit Information History Since Last Visit Added or deleted any medications: No Patient Arrived: Wheel Chair Any new allergies or adverse reactions: No Arrival Time: 10:37 Had a fall or experienced change in No Accompanied By: daughter activities of daily living that [name] affect Transfer Assistance: None risk of falls: Patient Identification Verified: Yes Signs or symptoms of abuse/neglect since last visito No Secondary Verification Process Completed: Yes Hospitalized since last visit: No Patient Requires Transmission-Based Precautions: No Implantable device outside of the clinic excluding No Patient Has Alerts: Yes cellular tissue based products placed in the center Patient Alerts: R ABI non compressible since last visit: Has Dressing in Place as Prescribed: Yes Pain Present Now: No Electronic Signature(s) Signed: 09/21/2019 9:13:31 AM By: [name] Entered By: [name] on 09/11/2019 10:38:06 -------------------------------------------------------------------------------- Clinic Level of Care Assessment Details Patient Name: Date of Service: [name] 09/11/2019 10:30 A M Medical Record Number: [id number] Patient Account Number: [id number] Date of Birth/Sex: Treating RN: [birth year] (86 y.o. [name] Primary Care [name]: [name] Other Clinician: Referring [name]: Treating [name]/Extender: [name] [place] in Treatment: 6 Clinic Level of Care Assessment  Items TOOL 4 Quantity Score X- 1 0 Use when only an EandM is performed on FOLLOW-UP visit ASSESSMENTS - Nursing Assessment / Reassessment X- 1 10 Reassessment of Co-morbidities (includes updates in patient status) X- 1 5 Reassessment of Adherence to Treatment Plan ASSESSMENTS - Wound and Skin A ssessment / Reassessment X - Simple Wound Assessment / Reassessment - one wound 1 5 []  - 0 Complex Wound Assessment / Reassessment - multiple wounds []  - 0 Dermatologic / Skin Assessment (not related to wound area) ASSESSMENTS - Focused Assessment []  - 0 Circumferential Edema Measurements - multi extremities []  - 0 Nutritional Assessment / Counseling / Intervention X- 1 5 Lower Extremity Assessment (monofilament, tuning fork, pulses) []  - 0 Peripheral Arterial Disease Assessment (using hand held doppler) ASSESSMENTS - Ostomy and/or Continence Assessment and Care []  - 0 Incontinence Assessment and Management []  - 0 Ostomy Care Assessment and Management (repouching, etc.) PROCESS - Coordination of Care X - Simple Patient / Family Education for ongoing care 1 15 []  - 0 Complex (extensive) Patient / Family Education for ongoing care X- 1 10 Staff obtains Programmer, systems, Records, T Results / Process Orders est X- 1 10 Staff telephones HHA, Nursing Homes / Clarify orders / etc []  - 0 Routine Transfer to another Facility (non-emergent condition) []  - 0 Routine Hospital Admission (non-emergent condition) []  - 0 New Admissions / Biomedical engineer / Ordering NPWT Apligraf, etc. , []  - 0 Emergency Hospital Admission (emergent condition) X- 1 10 Simple Discharge Coordination []  - 0 Complex (extensive) Discharge Coordination PROCESS - Special Needs []  - 0 Pediatric / Minor Patient Management []  - 0 Isolation Patient Management []  - 0 Hearing / Language / Visual special needs []  - 0 Assessment of Community assistance (transportation, D/C planning, etc.) []  - 0 Additional  assistance / Altered mentation []  - 0 Support Surface(s) Assessment (bed, cushion, seat, etc.) INTERVENTIONS - Wound Cleansing / Measurement X - Simple Wound Cleansing - one wound 1 5 []  -  0 Complex Wound Cleansing - multiple wounds X- 1 5 Wound Imaging (photographs - any number of wounds) []  - 0 Wound Tracing (instead of photographs) X- 1 5 Simple Wound Measurement - one wound []  - 0 Complex Wound Measurement - multiple wounds INTERVENTIONS - Wound Dressings X - Small Wound Dressing one or multiple wounds 1 10 []  - 0 Medium Wound Dressing one or multiple wounds []  - 0 Large Wound Dressing one or multiple wounds X- 1 5 Application of Medications - topical []  - 0 Application of Medications - injection INTERVENTIONS - Miscellaneous []  - 0 External ear exam []  - 0 Specimen Collection (cultures, biopsies, blood, body fluids, etc.) []  - 0 Specimen(s) / Culture(s) sent or taken to Lab for analysis []  - 0 Patient Transfer (multiple staff / Civil Service fast streamer / Similar devices) []  - 0 Simple Staple / Suture removal (25 or less) []  - 0 Complex Staple / Suture removal (26 or more) []  - 0 Hypo / Hyperglycemic Management (close monitor of Blood Glucose) []  - 0 Ankle / Brachial Index (ABI) - do not check if billed separately X- 1 5 Vital Signs Has the patient been seen at the hospital within the last three years: Yes Total Score: 105 Level Of Care: New/Established - Level 3 Electronic Signature(s) Signed: 09/11/2019 5:12:22 PM By: [name] Hurst RN, BSN Entered By: [name] on 09/11/2019 11:16:38 -------------------------------------------------------------------------------- Lower Extremity Assessment Details Patient Name: Date of Service: [place] [name] Fuchs E. 09/11/2019 10:30 A M Medical Record Number: [id number] Patient Account Number: [id number] Date of Birth/Sex: Treating RN: 07-Oct-1923 (86 y.o. [name] Primary Care [name]: [name] Other  Clinician: Referring [name]: Treating [name]/Extender: [name] in Treatment: 6 Edema Assessment Assessed: [name]: No] [Right: Yes] Edema: [Left: N] [Right: o] Calf Left: Right: Point of Measurement: cm From Medial Instep cm 29 cm Ankle Left: Right: Point of Measurement: cm From Medial Instep cm 19 cm Vascular Assessment Pulses: Dorsalis Pedis Palpable: [Right:Yes] Electronic Signature(s) Signed: 09/11/2019 4:00:55 PM By: Deon Pilling Entered By: Deon Pilling on 09/11/2019 10:51:00 -------------------------------------------------------------------------------- Multi-Disciplinary Care Plan Details Patient Name: Date of Service: MA [name] Fuchs E. 09/11/2019 10:30 A M Medical Record Number: [id number] Patient Account Number: [id number] Date of Birth/Sex: Treating RN: [name] 27, 1925 (86 y.o. [name] Primary Care Johnross Nabozny: [name] Other Clinician: Referring Dreyah Montrose: Treating Orvie Caradine/Extender: [name] [place] in Treatment: 6 Active Inactive Wound/Skin Impairment Nursing Diagnoses: Knowledge deficit related to ulceration/compromised skin integrity Goals: Patient/caregiver will verbalize understanding of skin care regimen Date Initiated: 07/25/2019 Target Resolution Date: 09/22/2019 Goal Status: Active Ulcer/skin breakdown will have a volume reduction of 30% by week 4 Date Initiated: 07/25/2019 Date Inactivated: 08/28/2019 Target Resolution Date: 08/25/2019 Goal Status: Unmet Unmet Reason: Osteomyelitis Interventions: Assess patient/caregiver ability to obtain necessary supplies Assess patient/caregiver ability to perform ulcer/skin care regimen upon admission and as needed Assess ulceration(s) every visit Notes: Electronic Signature(s) Signed: 09/11/2019 5:12:22 PM By: [name] Hurst RN, BSN Entered By: [name] on 09/11/2019  11:15:15 -------------------------------------------------------------------------------- Pain Assessment Details Patient Name: Date of Service: [place] [name] Fuchs E. 09/11/2019 10:30 A M Medical Record Number: [id number] Patient Account Number: [id number] Date of Birth/Sex: Treating RN: 1923/08/21 (86 y.o. [name] Primary Care Angela Platner: [name] Other Clinician: Referring Courtlynn Holloman: Treating Lilias Lorensen/Extender: [name] [place] in Treatment: 6 Active Problems Location of Pain Severity and Description of Pain Patient Has Paino No Site Locations Pain Management and Medication Current Pain Management: Electronic Signature(s) Signed:  09/11/2019 5:12:22 PM By: [name] Hurst RN, BSN Signed: 09/21/2019 9:13:31 AM By: [name] Entered By: [name] on 09/11/2019 10:39:58 -------------------------------------------------------------------------------- Patient/Caregiver Education Details Patient Name: Date of Service: [name] 5/24/2021andnbsp10:30 A M Medical Record Number: [id number] Patient Account Number: [id number] Date of Birth/Gender: Treating RN: June 11, 1923 (86 y.o. [name] Primary Care Physician: [name] Other Clinician: Referring Physician: Treating Physician/Extender: [name] [place] in Treatment: 6 Education Assessment Education Provided To: Patient Education Topics Provided Wound/Skin Impairment: Methods: Explain/Verbal Responses: State content correctly Electronic Signature(s) Signed: 09/11/2019 5:12:22 PM By: [name] Hurst RN, BSN Entered By: [name] on 09/11/2019 11:15:30 -------------------------------------------------------------------------------- Wound Assessment Details Patient Name: Date of Service: MA [name] Fuchs E. 09/11/2019 10:30 A M Medical Record Number: [id number] Patient Account Number: [id number] Date of Birth/Sex: Treating  RN: 02/06/1924 (86 y.o. [name] Primary Care Chinedu Agustin: [name] Other Clinician: Referring Bitania Shankland: Treating Hue Frick/Extender: [name] [place] in Treatment: 6 Wound Status Wound Number: 2 Primary Diabetic Wound/Ulcer of the Lower Extremity Etiology: Wound Location: Right T Great oe Wound Open Wounding Event: Gradually Appeared Status: Date Acquired: 06/15/2019 Comorbid Cataracts, Arrhythmia, Hypertension, Peripheral Arterial Disease, Weeks Of Treatment: 6 History: Type II Diabetes, Osteomyelitis, Dementia, Neuropathy, Received Clustered Wound: No Chemotherapy Photos Photo Uploaded By: Mikeal Hawthorne on 09/12/2019 13:58:11 Wound Measurements Length: (cm) 0.3 Width: (cm) 0.5 Depth: (cm) 0.3 Area: (cm) 0.118 Volume: (cm) 0.035 % Reduction in Area: -25.5% % Reduction in Volume: -25% Epithelialization: None Tunneling: No Undermining: No Wound Description Classification: Grade 2 Wound Margin: Distinct, outline attached Exudate Amount: Small Exudate Type: Serosanguineous Exudate Color: red, brown Foul Odor After Cleansing: No Slough/Fibrino No Wound Bed Granulation Amount: Large (67-100%) Exposed Structure Granulation Quality: Pink, Pale Fascia Exposed: No Necrotic Amount: None Present (0%) Fat Layer (Subcutaneous Tissue) Exposed: Yes Tendon Exposed: No Muscle Exposed: No Joint Exposed: No Bone Exposed: No Electronic Signature(s) Signed: 09/11/2019 4:00:55 PM By: Deon Pilling Signed: 09/11/2019 5:12:22 PM By: [name] Hurst RN, BSN Entered By: Deon Pilling on 09/11/2019 10:52:35 -------------------------------------------------------------------------------- Vitals Details Patient Name: Date of Service: [place] [name] Fuchs E. 09/11/2019 10:30 A M Medical Record Number: [id number] Patient Account Number: [id number] Date of Birth/Sex: Treating RN: [birth year] (86 y.o. [name] Primary Care Marquesha Robideau: [name] Other Clinician: Referring Aarav Burgett: Treating Safir Michalec/Extender: [name] [place] in Treatment: 6 Vital Signs Time Taken: 10:39 Temperature (F): 98.1 Height (in): 70 Pulse (bpm): 78 Weight (lbs): 140 Respiratory Rate (breaths/min): 18 Body Mass Index (BMI): 20.1 Blood Pressure (mmHg): 149/94 Reference Range: 80 - 120 mg / dl Electronic Signature(s) Signed: 09/21/2019 9:13:31 AM By: [name] Entered By: [name] on 09/11/2019 10:39:47

## 2019-09-22 DIAGNOSIS — L8990 Pressure ulcer of unspecified site, unspecified stage: Secondary | ICD-10-CM | POA: Insufficient documentation

## 2019-09-22 DIAGNOSIS — R55 Syncope and collapse: Secondary | ICD-10-CM | POA: Diagnosis not present

## 2019-09-22 LAB — GLUCOSE, CAPILLARY
Glucose-Capillary: 184 mg/dL — ABNORMAL HIGH (ref 70–99)
Glucose-Capillary: 194 mg/dL — ABNORMAL HIGH (ref 70–99)
Glucose-Capillary: 232 mg/dL — ABNORMAL HIGH (ref 70–99)
Glucose-Capillary: 201 mg/dL — ABNORMAL HIGH (ref 70–99)

## 2019-09-22 LAB — COMPREHENSIVE METABOLIC PANEL
ALT: 14 U/L (ref 0–44)
AST: 16 U/L (ref 15–41)
Albumin: 2.5 g/dL — ABNORMAL LOW (ref 3.5–5.0)
Alkaline Phosphatase: 38 U/L (ref 38–126)
Anion gap: 8 (ref 5–15)
BUN: 17 mg/dL (ref 8–23)
CO2: 21 mmol/L — ABNORMAL LOW (ref 22–32)
Calcium: 9.1 mg/dL (ref 8.9–10.3)
Chloride: 111 mmol/L (ref 98–111)
Creatinine, Ser: 1.21 mg/dL (ref 0.61–1.24)
GFR calc Af Amer: 59 mL/min — ABNORMAL LOW (ref >60)
GFR calc non Af Amer: 51 mL/min — ABNORMAL LOW (ref >60)
Glucose, Bld: 182 mg/dL — ABNORMAL HIGH (ref 70–99)
Potassium: 4.9 mmol/L (ref 3.5–5.1)
Sodium: 140 mmol/L (ref 135–145)
Total Bilirubin: 0.8 mg/dL (ref 0.3–1.2)
Total Protein: 4.9 g/dL — ABNORMAL LOW (ref 6.5–8.1)

## 2019-09-22 LAB — URINALYSIS, ROUTINE W REFLEX MICROSCOPIC
Bilirubin Urine: NEGATIVE
Glucose, UA: NEGATIVE mg/dL
Hgb urine dipstick: NEGATIVE
Ketones, ur: NEGATIVE mg/dL
Leukocytes,Ua: NEGATIVE
Nitrite: NEGATIVE
Protein, ur: NEGATIVE mg/dL
Specific Gravity, Urine: 1.035 — ABNORMAL HIGH (ref 1.005–1.030)
pH: 5 (ref 5.0–8.0)

## 2019-09-22 LAB — CBC
HCT: 27.1 % — ABNORMAL LOW (ref 39.0–52.0)
Hemoglobin: 9 g/dL — ABNORMAL LOW (ref 13.0–17.0)
MCH: 32.8 pg (ref 26.0–34.0)
MCHC: 33.2 g/dL (ref 30.0–36.0)
MCV: 98.9 fL (ref 80.0–100.0)
Platelets: 164 10*3/uL (ref 150–400)
RBC: 2.74 MIL/uL — ABNORMAL LOW (ref 4.22–5.81)
RDW: 13.2 % (ref 11.5–15.5)
WBC: 12.5 10*3/uL — ABNORMAL HIGH (ref 4.0–10.5)
nRBC: 0 % (ref 0.0–0.2)

## 2019-09-22 LAB — MAGNESIUM: Magnesium: 1 mg/dL — ABNORMAL LOW (ref 1.7–2.4)

## 2019-09-22 MED ORDER — MAGNESIUM SULFATE 2 GM/50ML IV SOLN
2.0000 g | Freq: Once | INTRAVENOUS | Status: AC
  Administered 2019-09-22: 2 g via INTRAVENOUS
  Filled 2019-09-22: qty 50

## 2019-09-22 MED ORDER — HYDROCORTISONE NA SUCCINATE PF 100 MG IJ SOLR
50.0000 mg | Freq: Three times a day (TID) | INTRAMUSCULAR | Status: DC
  Administered 2019-09-22 – 2019-09-23 (×3): 50 mg via INTRAVENOUS
  Filled 2019-09-22 (×3): qty 2

## 2019-09-22 NOTE — Progress Notes (Signed)
Mobility Specialist - Progress Note   09/22/19 1712  Mobility  Activity Stood at bedside;Transferred:  Chair to bed  Level of Assistance Maximum assist, patient does 25-49%  Assistive Device Front wheel walker  Mobility Response Tolerated poorly  Mobility performed by Mobility specialist  $Mobility charge 1 Mobility    Pre-mobility: 70 HR, 117/56 BP, 95% SpO2 Post-mobility: 91 HR, 123/61 BP, 98% SPO2  I initially stood pt up with a mod assist, but he was unable to support his weight on his R leg with his RW. I then had him put his arms around my neck and had to do a max assist to stand him. He was able to take small steps on his own with me having to support his weight. His HR spiked into the 140s during ambulation.   [place] Specialist

## 2019-09-22 NOTE — Progress Notes (Signed)
PROGRESS NOTE    [name]  ELF:[id number] DOB: 03/11/1924 DOA: 09/20/2019 PCP: [name], NP   Brief Narrative: 86 year old with past medical history significant for A. fib, aspiration pneumonia, diabetes type 2, peripheral artery disease a status post previous amputation, dementia, prostate cancer who initially presented for an nonhealing wound of his right great toe.  He is 1 day then he with medical management.  On 6/2 patient underwent arteriogram with right tibial intervention for nonhealing wound.  Following procedure patient had 2 large bowel movement and had a syncopal event.  Patient had an episode of A. fib with RVR and he still TSN was resumed.  He received IV fluids and he was noted to have orthostatic hypotension.    Assessment & Plan:   Active Problems:   Vagal reaction    Pressure Injury 04/16/18 Stage I -  Intact skin with non-blanchable redness of a localized area usually over a bony prominence. (Active)  04/16/18 2130  Location: Buttocks  Location Orientation: Medial  Staging: Stage I -  Intact skin with non-blanchable redness of a localized area usually over a bony prominence.  Wound Description (Comments):   Present on Admission: Yes    1-Nonhealing wound of the right great toe/peripheral artery disease: -S/p right posterior tibial intervention by Dr. Carlis Abbott on 6/2 -Wound care consulted  2-Orthostatic hypotension: Symptomatic. Suspect related to adrenal insufficiency.  Patient has been on prednisone chronically. Plan to give him IV hydrocortisone for 24 hours and plan to resume home dose prednisone on 6/5 Repeat  orthostatic vitals on 6/5  3-Leukocytosis: Follow blood cultures; no growth to date. UA: Not sent yet On Keflex. Chest x ray; bilateral atelectasis.   4-normocytic anemia: Follow trend  5-Diabetes type 2: Continue with sliding scale insulin and glipizide 6-Atrial fibrillation: Continue with Eliquis and Cardizem Hypomagnesemia;  Replete IV  Estimated body mass index is 20.23 kg/m as calculated from the following:   Height as of this encounter: 5\' 10"  (1.778 m).   Weight as of this encounter: 64 kg.   DVT prophylaxis: Eliquis Code Status:  Family Communication: Discussed with daughter Disposition Plan:  Status is:   Dispo: The patient is from: Home              Anticipated d/c is to: Home              Anticipated d/c date is: 2 days              Patient currently is not medically stable to d/c.         Subjective: Alert, denies chest pain shortness of breath.  Objective: Vitals:   09/21/19 1931 09/21/19 2336 09/22/19 0354 09/22/19 0739  BP: 105/61 112/60 135/83 126/65  Pulse: 97 86 100 96  Resp: (!) 21 17 17 18   Temp: 98.6 F (37 C) 98.4 F (36.9 C) 98.5 F (36.9 C) 99 F (37.2 C)  TempSrc: Oral Oral Oral Oral  SpO2: 100% 100% 98% 100%  Weight:      Height:        Intake/Output Summary (Last 24 hours) at 09/22/2019 0832 Last data filed at 09/21/2019 1700 Gross per 24 hour  Intake 754.32 ml  Output 450 ml  Net 304.32 ml   Filed Weights   09/20/19 0845  Weight: 64 kg    Examination:  General exam: Appears calm and comfortable  Respiratory system: Clear to auscultation. Respiratory effort normal. Cardiovascular system: S1 & S2 heard, RRR. No JVD, murmurs,  rubs, gallops or clicks. No pedal edema. Gastrointestinal system: Abdomen is nondistended, soft and nontender. No organomegaly or masses felt. Normal bowel sounds heard. Central nervous system: Alert and oriented.  Extremities: Symmetric 5 x 5 power. Skin: No rashes, lesions or ulcers   Data Reviewed: I have personally reviewed following labs and imaging studies  CBC: Recent Labs  Lab 09/20/19 0928 09/21/19 0240 09/21/19 0802 09/22/19 0327  WBC  --  9.8 10.8* 12.5*  HGB 11.2* 9.3* 9.4* 9.0*  HCT 33.0* 27.4* 27.9* 27.1*  MCV  --  97.2 95.9 98.9  PLT  --  171 172 542   Basic Metabolic Panel: Recent Labs  Lab  09/20/19 0928 09/21/19 0240 09/22/19 0327  NA 141 138 140  K 4.4 3.6 4.9  CL 105 110 111  CO2  --  21* 21*  GLUCOSE 104* 219* 182*  BUN 19 15 17   CREATININE 0.70 0.99 1.21  CALCIUM  --  9.0 9.1  MG  --   --  1.0*   GFR: Estimated Creatinine Clearance: 34.5 mL/min (by C-G formula based on SCr of 1.21 mg/dL). Liver Function Tests: Recent Labs  Lab 09/22/19 0327  AST 16  ALT 14  ALKPHOS 38  BILITOT 0.8  PROT 4.9*  ALBUMIN 2.5*   No results for input(s): LIPASE, AMYLASE in the last 168 hours. No results for input(s): AMMONIA in the last 168 hours. Coagulation Profile: No results for input(s): INR, PROTIME in the last 168 hours. Cardiac Enzymes: No results for input(s): CKTOTAL, CKMB, CKMBINDEX, TROPONINI in the last 168 hours. BNP (last 3 results) No results for input(s): PROBNP in the last 8760 hours. HbA1C: No results for input(s): HGBA1C in the last 72 hours. CBG: Recent Labs  Lab 09/20/19 1756 09/21/19 0631 09/21/19 1732 09/21/19 2121 09/22/19 0612  GLUCAP 104* 202* 182* 124* 184*   Lipid Profile: No results for input(s): CHOL, HDL, LDLCALC, TRIG, CHOLHDL, LDLDIRECT in the last 72 hours. Thyroid Function Tests: No results for input(s): TSH, T4TOTAL, FREET4, T3FREE, THYROIDAB in the last 72 hours. Anemia Panel: No results for input(s): VITAMINB12, FOLATE, FERRITIN, TIBC, IRON, RETICCTPCT in the last 72 hours. Sepsis Labs: No results for input(s): PROCALCITON, LATICACIDVEN in the last 168 hours.  Recent Results (from the past 240 hour(s))  SARS CORONAVIRUS 2 (TAT 6-24 HRS) Nasopharyngeal Nasopharyngeal Swab     Status: None   Collection Time: 09/19/19 10:54 AM   Specimen: Nasopharyngeal Swab  Result Value Ref Range Status   SARS Coronavirus 2 NEGATIVE NEGATIVE Final    Comment: (NOTE) SARS-CoV-2 target nucleic acids are NOT DETECTED. The SARS-CoV-2 RNA is generally detectable in upper and lower respiratory specimens during the acute phase of infection.  Negative results do not preclude SARS-CoV-2 infection, do not rule out co-infections with other pathogens, and should not be used as the sole basis for treatment or other patient management decisions. Negative results must be combined with clinical observations, patient history, and epidemiological information. The expected result is Negative. Fact Sheet for Patients: SugarRoll.be Fact Sheet for Healthcare Providers: https://www.woods-mathews.com/ This test is not yet approved or cleared by the [place] FDA and  has been authorized for detection and/or diagnosis of SARS-CoV-2 by FDA under an Emergency Use Authorization (EUA). This EUA will remain  in effect (meaning this test can be used) for the duration of the COVID-19 declaration under Section 56 4(b)(1) of the Act, 21 U.S.C. section 360bbb-3(b)(1), unless the authorization is terminated or revoked sooner. Performed at [facility]  Lab, [address], [place], [place] [address]          Radiology Studies: PERIPHERAL VASCULAR CATHETERIZATION  Result Date: 09/20/2019 Patient name: [name] MRN: [id number]        DOB: 04-Dec-1923          Sex: male  09/20/2019 Pre-operative Diagnosis: Critical limb ischemia of the right lower extremity with nonhealing wound Post-operative diagnosis:  Same Surgeon:  Marty Heck, MD Procedure Performed: 1.  Ultrasound-guided access of left common femoral artery 2.  Aortogram including catheter selection of aorta 3.  Bilateral lower extremity arteriogram including selection of third order branches in the right lower extremity 4.  Right posterior tibial artery mechanical orbital atherectomy (1.25 mm CSI atherectomy catheter) 5.  Right posterior tibial artery angioplasty (2.5 mm and 3 mm coyote) 6.  58 minutes monitored conscious sedation 7.  Mynx closure of the left common femoral artery  Indications: 86 year old male that previously had been under  the care of Dr. Oneida Alar that presented with new wound to his right great toe that has been nonhealing.  He was subsequently scheduled for lower extremity arteriogram and possible intervention after risk benefits discussed.  Findings:  Aortogram showed patent renal arteries bilaterally with patent aortoiliac segment bilaterally.  There was no evidence of flow-limiting stenosis in aortoiliac segment.  Very sluggish flow down both extremities from likely centralized process.  Right lower extremity arteriogram showed patent common femoral, profunda, SFA, above and below-knee popliteal artery.  Dominant runoff in the right lower extremity was through anterior tibial (that was much more brisk) that became very small at the level of the ankle and had minimal outflow into the foot.  Peroneal was patent but atretic.  The posterior tibial flow was much slower and the artery appeared to have multiple focal high-grade stenosis as well as a short chronic total occlusion in the distal segment and also was heavily calcified with only about half the plantar arch highlighting on initial images.  Left lower extremity arteriogram showed a patent common femoral, profunda, SFA, above and below-knee popliteal artery, and apparent three-vessel runoff.  Again the left lower extremity vessels below the knee were heavily calcified and diseased and were not completely evaluated due to sluggish flow down both lower extremities.  Ultimately given dominant inflow was through the anterior tibial in the right lower extremity with no focal stenosis we left this alone.  Selected the posterior tibial where flow was more sluggish and was able to cross all the stenotic lesions as well as short chronic total occlusion.  Mechanical orbital atherectomy was then performed at low medium and high speeds down to the level of the ankle and this was angioplastied with 2.5 mm and 3 mm balloon.  Flow remained very sluggish down the posterior tibial artery but  was patent.  This included after we injected nitroglycerin directly into the foot.  We had the same preserved plantar runoff that we had at beginning of the case.  Brisk PT signal in right foot.              Procedure:  The patient was identified in the holding area and taken to room 8.  The patient was then placed supine on the table and prepped and draped in the usual sterile fashion.  A time out was called.  Ultrasound was used to evaluate the left common femoral artery.  It was patent .  A digital ultrasound image was acquired.  A micropuncture needle was used  to access the left common femoral artery under ultrasound guidance.  An 018 wire was advanced without resistance and a micropuncture sheath was placed.  The 018 wire was removed and a benson wire was placed.  The micropuncture sheath was exchanged for a 5 french sheath.  An omniflush catheter was advanced over the wire to the level of L-1.  An abdominal angiogram was obtained.  Next the catheter was pulled down and bilateral lower extremity runoff was obtained.  Given very sluggish flow down both extremities we elected to select the right leg.  Ultimately a Omni Flush catheter with a Bentson wire was used to select the right iliac and ultimately we selected the right SFA.  I then advanced a long Rosen wire down to the popliteal artery and exchanged for a straight 100 cm flush catheter.  We then got dedicated tibial imaging of the right lower extremity.  As noted above the anterior tibial was dominant runoff into the foot although he has significant small vessel disease below the ankle.  The peroneal was atretic.  The posterior tibial had multiple focal high-grade stenosis and a short chronic total occlusion so elected to try and treat this to improve flow into the foot.  We then used a weighted Ziliant wire initially with 6 g and then a 12 g wire as well as a CXI catheter to select the posterior tibial artery.  Ultimately I could get my wire down into the  foot but I could not get the 0.018 CXI catheter to track.  We then downsized to a 0.014 system with a weighted Ziliant wire 12 g and used TelePort 014 catheter to get catheter into the foot.  We confirmed with hand-injection that we were indeed in the true lumen.  We then exchanged for a Viper wire and gave additional nitroglycerin and over 800 mcg in the case.  We then used a 1.25 mm micro CSI catheter and a mechanical atherectomy was performed low medium high speeds of the posterior tibial artery throughout almost its entire segment.  I then angioplastied this with a 2.5 mm coyote balloon.  Another hand-injection showed very sluggish flow down the posterior tibial which is consistent with what his initial imaging showed.  I elected to put a catheter back down and then inject nitroglycerin directly into the foot to try and improve his outflow given we done atherectomy.  Also upsized to a 3 mm coyote and angioplastied the posterior tibial again.  Again the flow was very sluggish but it did not like there was anything as we could do and we had the same preserved runoff from the beginning of the case.  He had a very good posterior tibial signal on exam.  We then exchanged for a short 6 French sheath in the left groin and other wires and catheters removed and a mynx closure device was deployed.  Plan: Patient will be loaded on Plavix.  Will arrange follow-up in 1 month with ABIs and right leg arterial duplex.  He is already going to wound care for his right great toe wound.  No signs of infection at this time.     Marty Heck, MD [facility] Office: [place]  DG CHEST PORT 1 VIEW  Result Date: 09/21/2019 CLINICAL DATA:  Orthostatic hypotension. EXAM: PORTABLE CHEST 1 VIEW COMPARISON:  Radiograph 01/25/2019 FINDINGS: Upper normal heart size. Unchanged mediastinal contours with aortic atherosclerosis. No pulmonary edema. Minor bibasilar  atelectasis. No  pleural fluid or pneumothorax. Scoliotic curvature of the spine versus positioning. No acute osseous abnormalities are seen. IMPRESSION: 1. Borderline cardiomegaly.  Aortic Atherosclerosis (ICD10-I70.0). 2. Minor bibasilar atelectasis. Electronically Signed   By: Keith Rake M.D.   On: 09/21/2019 15:53        Scheduled Meds: . apixaban  5 mg Oral BID  . cephALEXin  250 mg Oral Q12H  . clopidogrel  75 mg Oral Q breakfast  . diltiazem  60 mg Oral BID  . donepezil  10 mg Oral Daily  . feeding supplement (ENSURE ENLIVE)  237 mL Oral Daily  . glipiZIDE  5 mg Oral Q breakfast  . hydrocortisone sod succinate (SOLU-CORTEF) inj  50 mg Intravenous Q8H  . insulin aspart  0-9 Units Subcutaneous TID WC  . mirtazapine  15 mg Oral QHS  . trimethoprim  100 mg Oral QHS   Continuous Infusions: . sodium chloride    . magnesium sulfate bolus IVPB       LOS: 0 days    Time spent: 35 minutes.     Elmarie Shiley, MD [facility]   If 7PM-7AM, please contact night-coverage [email or web address]  09/22/2019, 8:32 AM

## 2019-09-22 NOTE — Progress Notes (Signed)
Physical Therapy Treatment Patient Details Name: [name] MRN: [id number] DOB: [birth year] Today's Date: 09/22/2019    History of Present Illness Pt is a 86 y.o male s/p abdominal aortogram with LE arteriogram and posterior tibial intervention. After procedure, pt had bowel movement and had epsiode of unresponsiveness, so was kept overnight.     PT Comments    Patient progressing slowly towards PT goals. Better BP response to activity today; continues to be orthostatic but recovers well and noted to be asymptomatic. Tolerated standing with assist and taking a few steps in the room to the chair with daughter present. Noted to have black spot on right hallux and right heel is discolored which daughter reports is new. RN aware and observing this at end of session. Positioned right heel in floating position in chair.  Supine BP 127/73 Sitting BP 103/77 Standing BP 120/84 Sitting BP post transfer 112/73, asymptomatic.  HR ranged from 100-140s bpm with activity. Pt reports pain with WB through RLE. Will continue to follow and progress as tolerated.   Follow Up Recommendations  Home health PT;Supervision for mobility/OOB;Supervision/Assistance - 24 hour     Equipment Recommendations  None recommended by PT    Recommendations for Other Services       Precautions / Restrictions Precautions Precautions: Fall;Other (comment) Precaution Comments: Watch BP and HR Restrictions Weight Bearing Restrictions: No    Mobility  Bed Mobility Overal bed mobility: Needs Assistance Bed Mobility: Supine to Sit     Supine to sit: HOB elevated;Min assist     General bed mobility comments: Assist with trunk to get to EOB. Use of rail.  Transfers Overall transfer level: Needs assistance Equipment used: Rolling walker (2 wheeled) Transfers: Sit to/from Stand Sit to Stand: Mod assist         General transfer comment: Assist to power to standing with cues for hand placement/technique as pt  pulling up on RW. No dizziness. Pain worsened in foot upon standing.  Ambulation/Gait Ambulation/Gait assistance: Min assist Gait Distance (Feet): 5 Feet Assistive device: Rolling walker (2 wheeled) Gait Pattern/deviations: Trunk flexed;Decreased step length - right;Decreased step length - left Gait velocity: decreased Gait velocity interpretation: <1.31 ft/sec, indicative of household ambulator General Gait Details: Slow, unsteady gait with forward flexed posture. No dizziness reported. Pain with WB through RLE.   Stairs             Wheelchair Mobility    Modified Rankin (Stroke Patients Only)       Balance Overall balance assessment: Needs assistance Sitting-balance support: Feet supported;No upper extremity supported Sitting balance-Leahy Scale: Good Sitting balance - Comments: supervision for safety   Standing balance support: During functional activity Standing balance-Leahy Scale: Poor Standing balance comment: Reliant on UE support and Min guard                            Cognition Arousal/Alertness: Awake/alert Behavior During Therapy: WFL for tasks assessed/performed Overall Cognitive Status: Difficult to assess                                 General Comments: difficulty describing/elaborating on symptoms relating to pain; likely baseline, Very HOH, reads lips better      Exercises      General Comments General comments (skin integrity, edema, etc.): Daughter present during session. Supine BP 127/73, sitting BP 103/77, standing BP 120/84, sitting BP post  transfer 112/73, asymptomatic. HR ranged from 100-140s bpm with activity.      Pertinent Vitals/Pain Pain Assessment: Faces Faces Pain Scale: Hurts little more Pain Location: RLE  Pain Descriptors / Indicators: Grimacing;Guarding Pain Intervention(s): Monitored during session;Repositioned;Limited activity within patient's tolerance    Home Living                       Prior Function            PT Goals (current goals can now be found in the care plan section) Progress towards PT goals: Progressing toward goals    Frequency    Min 3X/week      PT Plan Current plan remains appropriate    Co-evaluation              AM-PAC PT "6 Clicks" Mobility   Outcome Measure  Help needed turning from your back to your side while in a flat bed without using bedrails?: A Little Help needed moving from lying on your back to sitting on the side of a flat bed without using bedrails?: A Little Help needed moving to and from a bed to a chair (including a wheelchair)?: A Lot Help needed standing up from a chair using your arms (e.g., wheelchair or bedside chair)?: A Lot Help needed to walk in hospital room?: A Little Help needed climbing 3-5 steps with a railing? : Total 6 Click Score: 14    End of Session Equipment Utilized During Treatment: Gait belt Activity Tolerance: Patient tolerated treatment well;Patient limited by pain Patient left: in chair;with call bell/phone within reach;with family/visitor present;with chair alarm set Nurse Communication: Mobility status;Other (comment)(improved BP) PT Visit Diagnosis: Unsteadiness on feet (R26.81);Muscle weakness (generalized) (M62.81)     Time: 2703-5009 PT Time Calculation (min) (ACUTE ONLY): 26 min  Charges:  $Therapeutic Activity: 23-37 mins                     Marisa Severin, PT, DPT Acute Rehabilitation Services Pager [phone] Office [phone]       [name] 09/22/2019, 11:44 AM

## 2019-09-22 NOTE — Consult Note (Signed)
WOC Nurse Consult Note: Reason for Consult: "decubitus ulcer; great toe wound" Reviewed images; great toe wound and heel wound noted by vascular. No orders given for care per VVS.  Wound type:arterial ulceration right great toe Noted "bruised heel on the same foot" will have bedside nursing update FS appropriately Pressure Injury POA: yes Measurement: see nursing flow sheet Wound bed: Right great toe; stable black eschar Drainage (amount, consistency, odor) none Periwound: intact  Dressing procedure/placement/frequency: No topical care for the great toe Prevalon boot ordered for offloading the right heel.  Discussed POC wit bedside nurse.  Re consult if needed, will not follow at this time. Thanks  Shahin Knierim [employer], RN,CWOCN, CNS, [place] [phone])

## 2019-09-22 NOTE — Plan of Care (Signed)
  Problem: Education: Goal: Knowledge of General Education information will improve Description Including pain rating scale, medication(s)/side effects and non-pharmacologic comfort measures Outcome: Progressing   Problem: Health Behavior/Discharge Planning: Goal: Ability to manage health-related needs will improve Outcome: Progressing   

## 2019-09-22 NOTE — Progress Notes (Addendum)
Progress Note    09/22/2019 7:33 AM 2 Days Post-Op  Subjective: no complaints this morning, states he is not having any pain   Vitals:   09/21/19 2336 09/22/19 0354  BP: 112/60 135/83  Pulse: 86 100  Resp: 17 17  Temp: 98.4 F (36.9 C) 98.5 F (36.9 C)  SpO2: 100% 98%   Physical Exam: General: elderly, well nourished, appears comfortable, in no distress Cardiac:  Irregular Lungs: non labored Extremities: Left femoral access site dressing clean and dry. No Hematoma. Bilateral 2+ femoral pulses, bilateral popliteal pulses. Right Doppler AT/pero. Left brisk DP/ PT. Right leg tender if you try to elevate off of bed. Some bruising of right heel. Bilateral lower extremities warm. Motor and sensory intact Abdomen:  Soft, non tender, non distended Neurologic: alert and oriented  CBC    Component Value Date/Time   WBC 12.5 (H) 09/22/2019 0327   RBC 2.74 (L) 09/22/2019 0327   HGB 9.0 (L) 09/22/2019 0327   HCT 27.1 (L) 09/22/2019 0327   PLT 164 09/22/2019 0327   MCV 98.9 09/22/2019 0327   MCH 32.8 09/22/2019 0327   MCHC 33.2 09/22/2019 0327   RDW 13.2 09/22/2019 0327   LYMPHSABS 2.2 09/06/2019 1117   MONOABS 1.4 (H) 09/06/2019 1117   EOSABS 0.1 09/06/2019 1117   BASOSABS 0.0 09/06/2019 1117    BMET    Component Value Date/Time   NA 140 09/22/2019 0327   K 4.9 09/22/2019 0327   CL 111 09/22/2019 0327   CO2 21 (L) 09/22/2019 0327   GLUCOSE 182 (H) 09/22/2019 0327   BUN 17 09/22/2019 0327   CREATININE 1.21 09/22/2019 0327   CREATININE 1.14 07/27/2013 1004   CALCIUM 9.1 09/22/2019 0327   GFRNONAA 51 (L) 09/22/2019 0327   GFRNONAA 58 (L) 07/27/2013 1004   GFRAA 59 (L) 09/22/2019 0327   GFRAA 66 07/27/2013 1004    INR    Component Value Date/Time   INR 1.2 07/02/2018 1159   INR 2.0 04/07/2010 0821     Intake/Output Summary (Last 24 hours) at 09/22/2019 0733 Last data filed at 09/21/2019 1700 Gross per 24 hour  Intake 754.32 ml  Output 450 ml  Net 304.32 ml      Assessment/Plan:  86 y.o. male is s/p  1. Ultrasound-guided access of left common femoral artery 2.Aortogram including catheter selection of aorta 3.Bilateral lower extremity arteriogram including selection of third order branches in the right lower extremity 4.Right posterior tibial artery mechanical orbital atherectomy (1.25 mm CSI atherectomy catheter) 5.Right posterior tibial artery angioplasty (2.5 mm and 3 mm coyote) 6.58 minutes monitored conscious sedation 7.Mynx closure of the left common femoral artery  2 Days Post-Op   Continues to have orthostatic hypotension OOB as tolerated this morning Left femoral access site without hematoma. Acute surgical blood loss anemia. Continue to monitor -Hgb 9 this morning Doppler AT/ Pero right foot, brisk Left DP/ PT signals. No complaints of pain unless right leg manipulated. Continue to float right heel off of bed. PT recommending HH PT Leukocytosis- WBC 12.5 this morning, afebrile UA and blood cultures pending Appreciate Hospitalist's assistance with management    DVT prophylaxis:  Eliquis   Karoline Caldwell, PA-C [facility] [phone] 09/22/2019 7:33 AM   I have seen and evaluated the patient. I agree with the PA note as documented above. 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  Admitted post-op for suspected vagal event in recovery.  Yesterday profoundly orthostatic even after  fluid challenge.  Appreciate hospitalist evaluation.  Infectious work-up started yesterday after medicine consult.  Apparently has been on low dose steroids and hospitalist plans to restart and monitior.  Is OOB to chair now.  Will put betadine paint on great toe.  Float heel and will order heel protector given some discoloration to right heel - likely from tibial intervention.  Possible d/c home this weekend pending improvement.  Groin stable.  Hgb stable.  DP/peroneal signals in  foot.  Marty Heck, MD [facility] [place] Office: [phone]

## 2019-09-22 NOTE — Discharge Instructions (Addendum)
NO METFORMIN/GLUCOPHAGE FOR 2 DAYS   Femoral Site Care This sheet gives you information about how to care for yourself after your procedure. Your health care provider may also give you more specific instructions. If you have problems or questions, contact your health care provider. What can I expect after the procedure? After the procedure, it is common to have:  Bruising that usually fades within 1-2 weeks.  Tenderness at the site. Follow these instructions at home: Wound care  Follow instructions from your health care provider about how to take care of your insertion site. Make sure you: ? Wash your hands with soap and water before you change your bandage (dressing). If soap and water are not available, use hand sanitizer. ? Change your dressing as told by your health care provider. ? Leave stitches (sutures), skin glue, or adhesive strips in place. These skin closures may need to stay in place for 2 weeks or longer. If adhesive strip edges start to loosen and curl up, you may trim the loose edges. Do not remove adhesive strips completely unless your health care provider tells you to do that.  Do not take baths, swim, or use a hot tub until your health care provider approves.  You may shower 24-48 hours after the procedure or as told by your health care provider. ? Gently wash the site with plain soap and water. ? Pat the area dry with a clean towel. ? Do not rub the site. This may cause bleeding.  Do not apply powder or lotion to the site. Keep the site clean and dry.  Check your femoral site every day for signs of infection. Check for: ? Redness, swelling, or pain. ? Fluid or blood. ? Warmth. ? Pus or a bad smell. Activity  For the first 2-3 days after your procedure, or as long as directed: ? Avoid climbing stairs as much as possible. ? Do not squat.  Do not lift anything that is heavier than 10 lb (4.5 kg), or the limit that you are told, until your health care provider  says that it is safe.  Rest as directed. ? Avoid sitting for a long time without moving. Get up to take short walks every 1-2 hours.  Do not drive for 24 hours if you were given a medicine to help you relax (sedative). General instructions  Take over-the-counter and prescription medicines only as told by your health care provider.  Keep all follow-up visits as told by your health care provider. This is important. Contact a health care provider if you have:  A fever or chills.  You have redness, swelling, or pain around your insertion site. Get help right away if:  The catheter insertion area swells very fast.  You pass out.  You suddenly start to sweat or your skin gets clammy.  The catheter insertion area is bleeding, and the bleeding does not stop when you hold steady pressure on the area.  The area near or just beyond the catheter insertion site becomes pale, cool, tingly, or numb. These symptoms may represent a serious problem that is an emergency. Do not wait to see if the symptoms will go away. Get medical help right away. Call your local emergency services (911 in the [place] Do not drive yourself to the hospital. Summary  After the procedure, it is common to have bruising that usually fades within 1-2 weeks.  Check your femoral site every day for signs of infection.  Do not lift anything that is heavier  than 10 lb (4.5 kg), or the limit that you are told, until your health care provider says that it is safe. This information is not intended to replace advice given to you by your health care provider. Make sure you discuss any questions you have with your health care provider. Document Revised: 04/19/2017 Document Reviewed: 04/19/2017 Elsevier Patient Education  [place]. Moderate Conscious Sedation, Adult, Care After These instructions provide you with information about caring for yourself after your procedure. Your health care provider may also give you more  specific instructions. Your treatment has been planned according to current medical practices, but problems sometimes occur. Call your health care provider if you have any problems or questions after your procedure. What can I expect after the procedure? After your procedure, it is common:  To feel sleepy for several hours.  To feel clumsy and have poor balance for several hours.  To have poor judgment for several hours.  To vomit if you eat too soon. Follow these instructions at home: For at least 24 hours after the procedure:   Do not: ? Participate in activities where you could fall or become injured. ? Drive. ? Use heavy machinery. ? Drink alcohol. ? Take sleeping pills or medicines that cause drowsiness. ? Make important decisions or sign legal documents. ? Take care of children on your own.  Rest. Eating and drinking  Follow the diet recommended by your health care provider.  If you vomit: ? Drink water, juice, or soup when you can drink without vomiting. ? Make sure you have little or no nausea before eating solid foods. General instructions  Have a responsible adult stay with you until you are awake and alert.  Take over-the-counter and prescription medicines only as told by your health care provider.  If you smoke, do not smoke without supervision.  Keep all follow-up visits as told by your health care provider. This is important. Contact a health care provider if:  You keep feeling nauseous or you keep vomiting.  You feel light-headed.  You develop a rash.  You have a fever. Get help right away if:  You have trouble breathing. This information is not intended to replace advice given to you by your health care provider. Make sure you discuss any questions you have with your health care provider. Document Revised: 03/19/2017 Document Reviewed: 07/27/2015 Elsevier Patient Education  [place].    Plavix prescription will be sent to pharmacy  and can resume tomorrow.  Can resume Eliquis today.  Will arrange follow-up in 2 weeks in our office.  FLOAT HEELS OFF THE BED WHEN LAYING IN BED TO HELP PREVENT PRESSURE SORES.       Information on my medicine - ELIQUIS (apixaban)  Why was Eliquis prescribed for you? Eliquis was prescribed for you to reduce the risk of a blood clot forming that can cause a stroke if you have a medical condition called atrial fibrillation (a type of irregular heartbeat).  What do You need to know about Eliquis ? Take your Eliquis TWICE DAILY - one tablet in the morning and one tablet in the evening with or without food. If you have difficulty swallowing the tablet whole please discuss with your pharmacist how to take the medication safely.  Take Eliquis exactly as prescribed by your doctor and DO NOT stop taking Eliquis without talking to the doctor who prescribed the medication.  Stopping may increase your risk of developing a stroke.  Refill your prescription before you  run out.  After discharge, you should have regular check-up appointments with your healthcare provider that is prescribing your Eliquis.  In the future your dose may need to be changed if your kidney function or weight changes by a significant amount or as you get older.  What do you do if you miss a dose? If you miss a dose, take it as soon as you remember on the same day and resume taking twice daily.  Do not take more than one dose of ELIQUIS at the same time to make up a missed dose.  Important Safety Information A possible side effect of Eliquis is bleeding. You should call your healthcare provider right away if you experience any of the following: ? Bleeding from an injury or your nose that does not stop. ? Unusual colored urine (red or dark brown) or unusual colored stools (red or black). ? Unusual bruising for unknown reasons. ? A serious fall or if you hit your head (even if there is no bleeding).  Some medicines may  interact with Eliquis and might increase your risk of bleeding or clotting while on Eliquis. To help avoid this, consult your healthcare provider or pharmacist prior to using any new prescription or non-prescription medications, including herbals, vitamins, non-steroidal anti-inflammatory drugs (NSAIDs) and supplements.  This website has more information on Eliquis (apixaban): http://www.eliquis.com/eliquis/home

## 2019-09-23 DIAGNOSIS — I96 Gangrene, not elsewhere classified: Secondary | ICD-10-CM | POA: Diagnosis present

## 2019-09-23 DIAGNOSIS — Z7984 Long term (current) use of oral hypoglycemic drugs: Secondary | ICD-10-CM | POA: Diagnosis not present

## 2019-09-23 DIAGNOSIS — I739 Peripheral vascular disease, unspecified: Secondary | ICD-10-CM | POA: Diagnosis not present

## 2019-09-23 DIAGNOSIS — C7951 Secondary malignant neoplasm of bone: Secondary | ICD-10-CM | POA: Diagnosis present

## 2019-09-23 DIAGNOSIS — I70221 Atherosclerosis of native arteries of extremities with rest pain, right leg: Secondary | ICD-10-CM | POA: Diagnosis present

## 2019-09-23 DIAGNOSIS — D62 Acute posthemorrhagic anemia: Secondary | ICD-10-CM | POA: Diagnosis not present

## 2019-09-23 DIAGNOSIS — J9811 Atelectasis: Secondary | ICD-10-CM | POA: Diagnosis not present

## 2019-09-23 DIAGNOSIS — Z79899 Other long term (current) drug therapy: Secondary | ICD-10-CM | POA: Diagnosis not present

## 2019-09-23 DIAGNOSIS — I4891 Unspecified atrial fibrillation: Secondary | ICD-10-CM | POA: Diagnosis not present

## 2019-09-23 DIAGNOSIS — F0390 Unspecified dementia without behavioral disturbance: Secondary | ICD-10-CM | POA: Diagnosis present

## 2019-09-23 DIAGNOSIS — Z85828 Personal history of other malignant neoplasm of skin: Secondary | ICD-10-CM | POA: Diagnosis not present

## 2019-09-23 DIAGNOSIS — I951 Orthostatic hypotension: Secondary | ICD-10-CM | POA: Diagnosis not present

## 2019-09-23 DIAGNOSIS — L97511 Non-pressure chronic ulcer of other part of right foot limited to breakdown of skin: Secondary | ICD-10-CM | POA: Diagnosis present

## 2019-09-23 DIAGNOSIS — E1152 Type 2 diabetes mellitus with diabetic peripheral angiopathy with gangrene: Secondary | ICD-10-CM | POA: Diagnosis present

## 2019-09-23 DIAGNOSIS — I771 Stricture of artery: Secondary | ICD-10-CM | POA: Diagnosis present

## 2019-09-23 DIAGNOSIS — D72829 Elevated white blood cell count, unspecified: Secondary | ICD-10-CM | POA: Diagnosis present

## 2019-09-23 DIAGNOSIS — R54 Age-related physical debility: Secondary | ICD-10-CM | POA: Diagnosis present

## 2019-09-23 DIAGNOSIS — Z8572 Personal history of non-Hodgkin lymphomas: Secondary | ICD-10-CM | POA: Diagnosis not present

## 2019-09-23 DIAGNOSIS — E11621 Type 2 diabetes mellitus with foot ulcer: Secondary | ICD-10-CM | POA: Diagnosis present

## 2019-09-23 DIAGNOSIS — K219 Gastro-esophageal reflux disease without esophagitis: Secondary | ICD-10-CM | POA: Diagnosis present

## 2019-09-23 DIAGNOSIS — I119 Hypertensive heart disease without heart failure: Secondary | ICD-10-CM | POA: Diagnosis present

## 2019-09-23 DIAGNOSIS — D472 Monoclonal gammopathy: Secondary | ICD-10-CM | POA: Diagnosis present

## 2019-09-23 DIAGNOSIS — Z7901 Long term (current) use of anticoagulants: Secondary | ICD-10-CM | POA: Diagnosis not present

## 2019-09-23 DIAGNOSIS — E11311 Type 2 diabetes mellitus with unspecified diabetic retinopathy with macular edema: Secondary | ICD-10-CM | POA: Diagnosis present

## 2019-09-23 LAB — MAGNESIUM: Magnesium: 1.9 mg/dL (ref 1.7–2.4)

## 2019-09-23 LAB — GLUCOSE, CAPILLARY
Glucose-Capillary: 183 mg/dL — ABNORMAL HIGH (ref 70–99)
Glucose-Capillary: 187 mg/dL — ABNORMAL HIGH (ref 70–99)

## 2019-09-23 MED ORDER — DICLOFENAC SODIUM 1 % EX GEL
2.0000 g | Freq: Four times a day (QID) | CUTANEOUS | Status: DC
  Administered 2019-09-23: 2 g via TOPICAL
  Filled 2019-09-23: qty 100

## 2019-09-23 MED ORDER — PREDNISONE 5 MG PO TABS: ORAL_TABLET | ORAL | 0 refills | Status: DC

## 2019-09-23 MED ORDER — MAGNESIUM HYDROXIDE NICU ORAL SYRINGE 400 MG/5 ML: 30.0000 mL | Freq: Once | ORAL | 0 refills | Status: AC

## 2019-09-23 MED ORDER — PREDNISONE 5 MG PO TABS
5.0000 mg | ORAL_TABLET | Freq: Two times a day (BID) | ORAL | Status: DC
  Administered 2019-09-23: 5 mg via ORAL
  Filled 2019-09-23: qty 1

## 2019-09-23 MED ORDER — MAGNESIUM HYDROXIDE NICU ORAL SYRINGE 400 MG/5 ML
30.0000 mL | Freq: Once | ORAL | Status: DC
  Filled 2019-09-23: qty 30

## 2019-09-23 MED ORDER — ACETAMINOPHEN 500 MG PO TABS: 500.0000 mg | ORAL_TABLET | Freq: Three times a day (TID) | ORAL | 0 refills | Status: DC

## 2019-09-23 MED ORDER — DICLOFENAC SODIUM 1 % EX GEL: 2.0000 g | Freq: Four times a day (QID) | CUTANEOUS | 0 refills | Status: DC

## 2019-09-23 MED ORDER — SENNA 8.6 MG PO TABS
1.0000 | ORAL_TABLET | Freq: Two times a day (BID) | ORAL | Status: DC
  Administered 2019-09-23: 8.6 mg via ORAL
  Filled 2019-09-23: qty 1

## 2019-09-23 MED ORDER — SENNA 8.6 MG PO TABS: 1.0000 | ORAL_TABLET | Freq: Two times a day (BID) | ORAL | 0 refills | Status: DC

## 2019-09-23 MED ORDER — CEPHALEXIN 250 MG PO CAPS: 250.0000 mg | ORAL_CAPSULE | Freq: Two times a day (BID) | ORAL | 0 refills | Status: DC

## 2019-09-23 MED ORDER — ACETAMINOPHEN 500 MG PO TABS
500.0000 mg | ORAL_TABLET | Freq: Three times a day (TID) | ORAL | Status: DC
  Administered 2019-09-23 (×2): 500 mg via ORAL
  Filled 2019-09-23 (×2): qty 1

## 2019-09-23 NOTE — TOC Transition Note (Addendum)
Transition of Care [facility]) - CM/SW Discharge Note   Patient Details  Name: [name] MRN: [id number] Date of Birth: 09/13/1923  Transition of Care [facility]) CM/SW Contact:  Claudie Leach, RN 09/23/2019, 2:20 PM   Clinical Narrative:    Discussed d/c plan with patient's daughter/POA.  Daughter states she lives with patient and his wife.  She states he had [facility] RN and PT PTA and would like to continue with them.  She states they may need a hospital bed and a wheelchair, but she would like for [facility] to order these things for her.  She states her father could walk to the mailbox PTA and yesterday could not walk any distance with PT.  She hopes he will improve at home and/or [facility] can help get equipment specified to their needs.    Oswald Hillock with [facility] advised of patient d/c.   Patient had improved mobility with PT today, so family plans to transport him home in their car.    Final next level of care: [place] Barriers to Discharge: No Barriers Identified   Patient Goals and CMS Choice Patient states their goals for this hospitalization and ongoing recovery are:: to walk to mailbox CMS Medicare.gov Compare Post Acute Care list provided to:: Patient Represenative (must comment)(daughter) Choice offered to / list presented to : Adult Children   Discharge Plan and Services      HH Arranged: PT, OT, RN [facility] Agency: [facility] Date [place]: 09/23/19 Time [place]: 78 Representative spoke with at [place]: Cassie       Readmission Risk Interventions Readmission Risk Prevention Plan 07/07/2018 07/06/2018 07/04/2018  Transportation Screening - - Complete  Medication Review Press photographer) - - Complete  PCP or Specialist appointment within 3-5 days of discharge - Complete -  [place] or [place] - - Complete  SW Recovery Care/Counseling Consult - - Complete  Palliative Care Screening Not Applicable Not  Applicable -  [place] Not Complete - -  SNF Comments patient re-evaluated by PT and now recommended for [facility] PT, family agreeable and will take patient home  - -  Some recent data might be hidden

## 2019-09-23 NOTE — Progress Notes (Addendum)
Progress Note    09/23/2019 7:24 AM 3 Days Post-procedure  Subjective:  S/p aortogram, right PTA atherectomy and angioplasty via left CFA puncture admitted secondary to orthostatic hypotension. Home meds included prednisone 5mg  BID which had not been restarted. Hospitalists prescribed Solu-cortef yesterday as well as restarting prednisone. Hx of right great toe ulceration  Vitals:   09/22/19 2335 09/23/19 0327  BP: (!) 91/55 117/90  Pulse: 83 80  Resp: 18 17  Temp: 98.3 F (36.8 C) 98.2 F (36.8 C)  SpO2: 100% 100%  PT notes from yesterday morning: Supine BP 127/73 Sitting BP 103/77 Standing BP 120/84 Sitting BP post transfer 112/73, asymptomatic  Tmax 99.4 yesterday evening  Physical Exam: Cardiac: Irregular rhythm, regular rate Lungs: Clear to auscultation bilaterally Extremities: Feet warm and well perfused.  Superficial dry eschar tip of right great toe.  Mild right heel ecchymosis with tenderness, no erythema or increased warmth.  Active range of motion of both feet. Pulse exam: 2+ palpable bilateral femoral, popliteal pulses.  He has bilateral dorsalis pedis, posterior tibial and peroneal Doppler signals Abdomen: Soft, nondistended.  Left groin puncture site is soft with mild ecchymosis extending to the medial thigh.  Right heel   Right foot     Blood cultures: NG x 2 days. UA unremarkable CBC    Component Value Date/Time   WBC 12.5 (H) 09/22/2019 0327   RBC 2.74 (L) 09/22/2019 0327   HGB 9.0 (L) 09/22/2019 0327   HCT 27.1 (L) 09/22/2019 0327   PLT 164 09/22/2019 0327   MCV 98.9 09/22/2019 0327   MCH 32.8 09/22/2019 0327   MCHC 33.2 09/22/2019 0327   RDW 13.2 09/22/2019 0327   LYMPHSABS 2.2 09/06/2019 1117   MONOABS 1.4 (H) 09/06/2019 1117   EOSABS 0.1 09/06/2019 1117   BASOSABS 0.0 09/06/2019 1117    BMET    Component Value Date/Time   NA 140 09/22/2019 0327   K 4.9 09/22/2019 0327   CL 111 09/22/2019 0327   CO2 21 (L) 09/22/2019 0327    GLUCOSE 182 (H) 09/22/2019 0327   BUN 17 09/22/2019 0327   CREATININE 1.21 09/22/2019 0327   CREATININE 1.14 07/27/2013 1004   CALCIUM 9.1 09/22/2019 0327   GFRNONAA 51 (L) 09/22/2019 0327   GFRNONAA 58 (L) 07/27/2013 1004   GFRAA 59 (L) 09/22/2019 0327   GFRAA 66 07/27/2013 1004     Intake/Output Summary (Last 24 hours) at 09/23/2019 0724 Last data filed at 09/23/2019 0600 Gross per 24 hour  Intake 360 ml  Output 600 ml  Net -240 ml    HOSPITAL MEDICATIONS Scheduled Meds: . apixaban  5 mg Oral BID  . cephALEXin  250 mg Oral Q12H  . clopidogrel  75 mg Oral Q breakfast  . diltiazem  60 mg Oral BID  . donepezil  10 mg Oral Daily  . feeding supplement (ENSURE ENLIVE)  237 mL Oral Daily  . glipiZIDE  5 mg Oral Q breakfast  . insulin aspart  0-9 Units Subcutaneous TID WC  . mirtazapine  15 mg Oral QHS  . predniSONE  5 mg Oral BID WC  . trimethoprim  100 mg Oral QHS   Continuous Infusions: PRN Meds:.acetaminophen, albuterol, metoprolol tartrate, oxyCODONE-acetaminophen  Assessment: Status post aortogram with right posterior tibial artery atherectomy and angioplasty.  Post procedure orthostasis, atrial fibrillation with RVR.  Heart rate controlled.  He currently is hemodynamically stable at rest.  Vagal reaction likely secondary to adrenal insufficiency.  On hydrocortisone IV.  Hgb stable.  Afebrile. blood cultures show no growth after 2 days.  No new labs today.   Plan: -Repeat orthostatic blood pressure readings.  Ambulate.  Appreciate hospitalist consultation and management.  Await their assessment this morning and update patient's daughter.  Will need home health/PT. -DVT prophylaxis:  SCDs (on clopidogrel and apixaban)   Risa Grill, PA-C [facility] [phone] 09/23/2019  7:24 AM   Agree with above.  Marginal perfusion to right foot.  Probably some atheroemboli to right heel no real change.  Apparently can't bear weight on right foot due to  pain.  Continue PT OT  Emphasized to daughter realistic expectations.  Pt has been in bed with several setbacks during hospital stay.  This may take several weeks of recovery and he may not ever completely recover from this deconditioning event.  If he has continued pain in his right foot only option may be AKA for palliation but I would give this several weeks to see how he recovers.  Awaiting further recommendations from hospitalist to continue d/c [place], MD [facility] [place] Office: [phone]

## 2019-09-23 NOTE — Progress Notes (Addendum)
PROGRESS NOTE    [name]  URK:[id number] DOB: 1923-07-05 DOA: 09/20/2019 PCP: [name], NP   Brief Narrative: 86 year old with past medical history significant for A. fib, aspiration pneumonia, diabetes type 2, peripheral artery disease a status post previous amputation, dementia, prostate cancer who initially presented for an nonhealing wound of his right great toe.  He is 1 day then he with medical management.  On 6/2 patient underwent arteriogram with right tibial intervention for nonhealing wound.  Following procedure patient had 2 large bowel movement and had a syncopal event.  Patient had an episode of A. fib with RVR and he still TSN was resumed.  He received IV fluids and he was noted to have orthostatic hypotension.    Assessment & Plan:   Active Problems:   Vagal reaction   Pressure injury of skin    Pressure Injury 04/16/18 Stage I -  Intact skin with non-blanchable redness of a localized area usually over a bony prominence. (Active)  04/16/18 2130  Location: Buttocks  Location Orientation: Medial  Staging: Stage I -  Intact skin with non-blanchable redness of a localized area usually over a bony prominence.  Wound Description (Comments):   Present on Admission: Yes     Pressure Injury 09/22/19 Heel Right Stage 1 -  Intact skin with non-blanchable redness of a localized area usually over a bony prominence. (Active)  09/22/19 0800  Location: Heel  Location Orientation: Right  Staging: Stage 1 -  Intact skin with non-blanchable redness of a localized area usually over a bony prominence.  Wound Description (Comments):   Present on Admission: Yes    1-Nonhealing wound of the right great toe/peripheral artery disease: -S/p right posterior tibial intervention by Dr. Carlis Abbott on 6/2 -Wound care consulted, no wound care needed.  -schedule tylenol to help with pain.   2-Orthostatic hypotension: resolved. BP improved on PT evaluation yesterday.  Initially  Symptomatic. Suspect related to adrenal insufficiency.  Patient has been on prednisone chronically. Patient received  IV hydrocortisone for 24 hours and plan to resume home dose prednisone on 6/5 Hold lisinopril at discharge, increase prednisone to 5 mg BID for 3 days then resume 2.5mg  BID. AVS reflects changes.   3-Leukocytosis: Follow blood cultures; no growth to date. UA: Not sent yet. On Keflex. Chest x ray; bilateral atelectasis.   4-Normocytic anemia: Follow trend  5-Diabetes type 2: Continue with sliding scale insulin and glipizide. Resume metformin.  6-Atrial fibrillation: Continue with Eliquis and Cardizem Hypomagnesemia; Replete IV Constipation; senna, and milk of magnesium ordered.  He has pass few gas.    Ok to discharge today, await for PT evaluation today. Daughter wants to discussed option with CM for disposition.   Estimated body mass index is 20.23 kg/m as calculated from the following:   Height as of this encounter: 5\' 10"  (1.778 m).   Weight as of this encounter: 64 kg.   DVT prophylaxis: Eliquis Code Status:  Family Communication: Discussed with daughter Disposition Plan:  Status is:   Dispo: The patient is from: Home              Anticipated d/c is to: Home              Anticipated d/c date is: 2 days              Patient currently is not medically stable to d/c.         Subjective: He is feeling better.  He was able to  stand up yesterday with PT.   Objective: Vitals:   09/22/19 2335 09/23/19 0327 09/23/19 0755 09/23/19 0940  BP: (!) 91/55 117/90 124/78 131/63  Pulse: 83 80 83   Resp: 18 17 16    Temp: 98.3 F (36.8 C) 98.2 F (36.8 C) 98.3 F (36.8 C)   TempSrc: Oral Oral Oral   SpO2: 100% 100% 99%   Weight:      Height:        Intake/Output Summary (Last 24 hours) at 09/23/2019 1249 Last data filed at 09/23/2019 0600 Gross per 24 hour  Intake 240 ml  Output 600 ml  Net -360 ml   Filed Weights   09/20/19 0845  Weight: 64 kg      Examination:  General exam: NAD Respiratory system: CTA Cardiovascular system: S 1, S 2 RRR Gastrointestinal system: BS present, soft ,nt Central nervous system: Alert and oriented Extremities: symmetric power.    Data Reviewed: I have personally reviewed following labs and imaging studies  CBC: Recent Labs  Lab 09/20/19 0928 09/21/19 0240 09/21/19 0802 09/22/19 0327  WBC  --  9.8 10.8* 12.5*  HGB 11.2* 9.3* 9.4* 9.0*  HCT 33.0* 27.4* 27.9* 27.1*  MCV  --  97.2 95.9 98.9  PLT  --  171 172 774   Basic Metabolic Panel: Recent Labs  Lab 09/20/19 0928 09/21/19 0240 09/22/19 0327 09/23/19 0313  NA 141 138 140  --   K 4.4 3.6 4.9  --   CL 105 110 111  --   CO2  --  21* 21*  --   GLUCOSE 104* 219* 182*  --   BUN 19 15 17   --   CREATININE 0.70 0.99 1.21  --   CALCIUM  --  9.0 9.1  --   MG  --   --  1.0* 1.9   GFR: Estimated Creatinine Clearance: 34.5 mL/min (by C-G formula based on SCr of 1.21 mg/dL). Liver Function Tests: Recent Labs  Lab 09/22/19 0327  AST 16  ALT 14  ALKPHOS 38  BILITOT 0.8  PROT 4.9*  ALBUMIN 2.5*   No results for input(s): LIPASE, AMYLASE in the last 168 hours. No results for input(s): AMMONIA in the last 168 hours. Coagulation Profile: No results for input(s): INR, PROTIME in the last 168 hours. Cardiac Enzymes: No results for input(s): CKTOTAL, CKMB, CKMBINDEX, TROPONINI in the last 168 hours. BNP (last 3 results) No results for input(s): PROBNP in the last 8760 hours. HbA1C: No results for input(s): HGBA1C in the last 72 hours. CBG: Recent Labs  Lab 09/22/19 1112 09/22/19 1617 09/22/19 2108 09/23/19 0559 09/23/19 1107  GLUCAP 194* 232* 201* 183* 187*   Lipid Profile: No results for input(s): CHOL, HDL, LDLCALC, TRIG, CHOLHDL, LDLDIRECT in the last 72 hours. Thyroid Function Tests: No results for input(s): TSH, T4TOTAL, FREET4, T3FREE, THYROIDAB in the last 72 hours. Anemia Panel: No results for input(s):  VITAMINB12, FOLATE, FERRITIN, TIBC, IRON, RETICCTPCT in the last 72 hours. Sepsis Labs: No results for input(s): PROCALCITON, LATICACIDVEN in the last 168 hours.  Recent Results (from the past 240 hour(s))  SARS CORONAVIRUS 2 (TAT 6-24 HRS) Nasopharyngeal Nasopharyngeal Swab     Status: None   Collection Time: 09/19/19 10:54 AM   Specimen: Nasopharyngeal Swab  Result Value Ref Range Status   SARS Coronavirus 2 NEGATIVE NEGATIVE Final    Comment: (NOTE) SARS-CoV-2 target nucleic acids are NOT DETECTED. The SARS-CoV-2 RNA is generally detectable in upper and lower respiratory specimens during  the acute phase of infection. Negative results do not preclude SARS-CoV-2 infection, do not rule out co-infections with other pathogens, and should not be used as the sole basis for treatment or other patient management decisions. Negative results must be combined with clinical observations, patient history, and epidemiological information. The expected result is Negative. Fact Sheet for Patients: SugarRoll.be Fact Sheet for Healthcare Providers: https://www.woods-mathews.com/ This test is not yet approved or cleared by the [place] FDA and  has been authorized for detection and/or diagnosis of SARS-CoV-2 by FDA under an Emergency Use Authorization (EUA). This EUA will remain  in effect (meaning this test can be used) for the duration of the COVID-19 declaration under Section 56 4(b)(1) of the Act, 21 U.S.C. section 360bbb-3(b)(1), unless the authorization is terminated or revoked sooner. Performed at [facility], [place] [address] [place], [place] [address]   Culture, blood (routine x 2)     Status: None (Preliminary result)   Collection Time: 09/21/19  6:40 PM   Specimen: BLOOD  Result Value Ref Range Status   Specimen Description BLOOD RIGHT ANTECUBITAL  Final   Special Requests   Final    BOTTLES DRAWN AEROBIC AND ANAEROBIC Blood Culture  adequate volume   Culture   Final    NO GROWTH 2 DAYS Performed at [facility], [place] [address] [place], [place] [address]    Report Status PENDING  Incomplete  Culture, blood (routine x 2)     Status: None (Preliminary result)   Collection Time: 09/21/19  6:53 PM   Specimen: BLOOD RIGHT HAND  Result Value Ref Range Status   Specimen Description BLOOD RIGHT HAND  Final   Special Requests   Final    BOTTLES DRAWN AEROBIC ONLY Blood Culture results may not be optimal due to an inadequate volume of blood received in culture bottles   Culture   Final    NO GROWTH 2 DAYS Performed at [facility], [place] [address] [place], [place] [address]    Report Status PENDING  Incomplete         Radiology Studies: DG CHEST PORT 1 VIEW  Result Date: 09/21/2019 CLINICAL DATA:  Orthostatic hypotension. EXAM: PORTABLE CHEST 1 VIEW COMPARISON:  Radiograph 01/25/2019 FINDINGS: Upper normal heart size. Unchanged mediastinal contours with aortic atherosclerosis. No pulmonary edema. Minor bibasilar atelectasis. No pleural fluid or pneumothorax. Scoliotic curvature of the spine versus positioning. No acute osseous abnormalities are seen. IMPRESSION: 1. Borderline cardiomegaly.  Aortic Atherosclerosis (ICD10-I70.0). 2. Minor bibasilar atelectasis. Electronically Signed   By: Keith Rake M.D.   On: 09/21/2019 15:53        Scheduled Meds: . acetaminophen  500 mg Oral TID  . apixaban  5 mg Oral BID  . cephALEXin  250 mg Oral Q12H  . clopidogrel  75 mg Oral Q breakfast  . diclofenac Sodium  2 g Topical QID  . diltiazem  60 mg Oral BID  . donepezil  10 mg Oral Daily  . feeding supplement (ENSURE ENLIVE)  237 mL Oral Daily  . glipiZIDE  5 mg Oral Q breakfast  . insulin aspart  0-9 Units Subcutaneous TID WC  . magnesium hydroxide  30 mL Oral Once  . mirtazapine  15 mg Oral QHS  . predniSONE  5 mg Oral BID WC  . senna  1 tablet Oral BID  . trimethoprim  100 mg Oral QHS   Continuous  Infusions:    LOS: 0 days  Time spent: 35 minutes.     Elmarie Shiley, MD [facility]   If 7PM-7AM, please contact night-coverage [email or web address]  09/23/2019, 12:49 PM

## 2019-09-23 NOTE — Progress Notes (Signed)
Physical Therapy Treatment Patient Details Name: [name] MRN: [id number] DOB: 1924-04-09 Today's Date: 09/23/2019    History of Present Illness Pt is a 86 y.o male s/p abdominal aortogram with LE arteriogram and posterior tibial intervention. After procedure, pt had bowel movement and had epsiode of unresponsiveness, so was kept overnight.     PT Comments    Patient progressing with mobility and able to perform bed mobility with S and sit to stand from elevated bed with S.  Still with increased time needed especially to ensure not light headed.  Ambulating with minguard to S assist wearing shoes with RW.  Daughter present and agrees he could go home in personal vehicle with staff assist to the car and family support once home.  They have transport chair, but discussed possibly a toilet riser but she wants HH to suggest best one for them.  Patient with HHPT prior to hospitalization, will need to resume PT at d/c.    Follow Up Recommendations  Home health PT;Supervision for mobility/OOB;Supervision/Assistance - 24 hour     Equipment Recommendations  None recommended by PT    Recommendations for Other Services       Precautions / Restrictions Precautions Precaution Comments: Watch BP and HR Restrictions Weight Bearing Restrictions: No    Mobility  Bed Mobility Overal bed mobility: Needs Assistance Bed Mobility: Supine to Sit     Supine to sit: HOB elevated;Supervision     General bed mobility comments: increased time cues for scooting to EOB  Transfers Overall transfer level: Needs assistance Equipment used: Rolling walker (2 wheeled)   Sit to Stand: From elevated surface;Supervision         General transfer comment: elevated height of bed, uses lift chair at home  Ambulation/Gait Ambulation/Gait assistance: Min guard;Supervision Gait Distance (Feet): 100 Feet Assistive device: Rolling walker (2 wheeled) Gait Pattern/deviations: Trunk flexed;Decreased stride  length;Wide base of support     General Gait Details: cues for forward gaze, assist for safety, but pt mobilizing well with RW, post op shoe on R foot, regular shoe on L   Stairs             Wheelchair Mobility    Modified Rankin (Stroke Patients Only)       Balance Overall balance assessment: Needs assistance Sitting-balance support: Feet supported;No upper extremity supported Sitting balance-Leahy Scale: Good     Standing balance support: During functional activity;Bilateral upper extremity supported Standing balance-Leahy Scale: Poor Standing balance comment: Reliant on UE support and Min guard                            Cognition Arousal/Alertness: Awake/alert Behavior During Therapy: WFL for tasks assessed/performed Overall Cognitive Status: History of cognitive impairments - at baseline                                 General Comments: daughter present and assisting, pt likely at baseline      Exercises      General Comments General comments (skin integrity, edema, etc.): HR max 131 with artifact with ambulation, pt asymptomatic      Pertinent Vitals/Pain Pain Score: 4  Pain Location: RLE  Pain Descriptors / Indicators: Discomfort;Tightness Pain Intervention(s): Monitored during session;Repositioned    Home Living  Prior Function            PT Goals (current goals can now be found in the care plan section) Progress towards PT goals: Progressing toward goals    Frequency    Min 3X/week      PT Plan Current plan remains appropriate    Co-evaluation              AM-PAC PT "6 Clicks" Mobility   Outcome Measure  Help needed turning from your back to your side while in a flat bed without using bedrails?: A Little Help needed moving from lying on your back to sitting on the side of a flat bed without using bedrails?: A Little Help needed moving to and from a bed to a chair  (including a wheelchair)?: A Little Help needed standing up from a chair using your arms (e.g., wheelchair or bedside chair)?: A Little Help needed to walk in hospital room?: A Little Help needed climbing 3-5 steps with a railing? : A Lot 6 Click Score: 17    End of Session   Activity Tolerance: Patient tolerated treatment well Patient left: in bed;with call bell/phone within reach;with family/visitor present(seated EOB) Nurse Communication: Mobility status(okay for home in personal vehicle) PT Visit Diagnosis: Unsteadiness on feet (R26.81);Muscle weakness (generalized) (M62.81)     Time: 8280-0349 PT Time Calculation (min) (ACUTE ONLY): 25 min  Charges:  $Gait Training: 8-22 mins $Therapeutic Activity: 8-22 mins                     Magda Kiel, [name] [facility] [phone] 09/23/2019    Reginia Naas 09/23/2019, 11:32 AM

## 2019-09-23 NOTE — Progress Notes (Signed)
Pt discharged today to home with family.  Pt's IV removed.  Pt taken off telemetry and CCMD notified.  Pt left with all of their personal belongings.  AVS documentation reviewed with Pt and daughter and all questions answered.

## 2019-09-25 ENCOUNTER — Telehealth: Payer: Self-pay | Admitting: *Deleted

## 2019-09-25 ENCOUNTER — Telehealth: Payer: Self-pay

## 2019-09-25 ENCOUNTER — Encounter (HOSPITAL_BASED_OUTPATIENT_CLINIC_OR_DEPARTMENT_OTHER): Payer: Medicare Other | Admitting: Internal Medicine

## 2019-09-25 NOTE — Telephone Encounter (Signed)
I have made the 1st attempt to contact the patient or family member in charge, in order to follow up from recently being discharged from the hospital. I left a message on voicemail but I will make another attempt at a different time.  

## 2019-09-25 NOTE — Telephone Encounter (Signed)
Spoke with pts daughter regarding concerns of pts right heel purple and now cool to touch. Stated she spoke with Dr. Carlis Abbott several times before pt left hospital after his [place] on 6/2. Pt also reports pain in lower leg mainly when standing. He denies any chest pain, shortness of breath, warmth to area or fever. Discussed with Gerri Lins, PA. Scheduled pt to see Dr. Carlis Abbott on tomorrow for further eval. Minette Brine, RN.

## 2019-09-26 ENCOUNTER — Other Ambulatory Visit: Payer: Self-pay

## 2019-09-26 ENCOUNTER — Ambulatory Visit (HOSPITAL_COMMUNITY): Payer: Medicare Other | Admitting: Nurse Practitioner

## 2019-09-26 ENCOUNTER — Encounter: Payer: Self-pay | Admitting: Vascular Surgery

## 2019-09-26 ENCOUNTER — Ambulatory Visit (HOSPITAL_COMMUNITY): Payer: Medicare Other

## 2019-09-26 ENCOUNTER — Ambulatory Visit (INDEPENDENT_AMBULATORY_CARE_PROVIDER_SITE_OTHER): Payer: Medicare Other | Admitting: Vascular Surgery

## 2019-09-26 ENCOUNTER — Telehealth: Payer: Self-pay | Admitting: *Deleted

## 2019-09-26 ENCOUNTER — Inpatient Hospital Stay (HOSPITAL_COMMUNITY): Payer: Medicare Other

## 2019-09-26 VITALS — BP 135/75 | HR 85 | Temp 97.2°F | Resp 18 | Ht 71.0 in | Wt 140.0 lb

## 2019-09-26 DIAGNOSIS — I739 Peripheral vascular disease, unspecified: Secondary | ICD-10-CM | POA: Diagnosis not present

## 2019-09-26 LAB — CULTURE, BLOOD (ROUTINE X 2)
Culture: NO GROWTH
Culture: NO GROWTH
Special Requests: ADEQUATE

## 2019-09-26 MED ORDER — TRAMADOL HCL 50 MG PO TABS: 50.0000 mg | ORAL_TABLET | Freq: Four times a day (QID) | ORAL | 0 refills | Status: DC | PRN

## 2019-09-26 NOTE — Progress Notes (Signed)
Patient name: [name] MRN: [id number] DOB: 04-May-1923 Sex: male  REASON FOR VISIT: Triage visit for right leg pain  HPI: [name] is a 86 y.o. male with history of diabetes, atrial fibrillation, hypertension, hyperlipidemia, PAD that presents as a triage visit for evaluation of right leg pain.  He recently underwent right posterior tibial intervention last week for critical limb ischemia with tissue loss (great toe ulcer).  Postop course was complicated by syncopal event while he was having a bowel movement in recovery and he was admitted for observation.  He then had ongoing issues with orthostasis and this was felt to be possibly related to adrenal insufficiency but as treated with steroids and appreciate the hospitalist input.  Ultimately he was discharged Saturday. He has been walking some at home with therapy and his walker and also having pain intermittently in the right calf and heel at night.  Not controlled with Tylenol.    Past Medical History:  Diagnosis Date  . Acute bronchitis   . Acute lower UTI 11/25/2017  . Amputation of toe of left foot ([place]) 07/30/2017, 08/27/2017    Great toe 07/30/2017 Second toe 08/27/2017   . Arthritis   . Aspiration pneumonia ([place]) 01/31/2018  . Atrial fibrillation ([place])    Dr. Harl Bowie- [facility] follows saw 11'14  . Bladder stones    tx. with oral meds and antibiotics, now surgery planned  . Bone metastases ([place]) 08/21/2015  . Cataracts, both eyes    surgery planned May 2015  . Community acquired pneumonia 04/16/2018  . Cystoid macular edema 04/21/2015    Right eye 04/21/2015   . Dehydration 04/16/2018  . Dementia without behavioral disturbance ([place]) 04/16/2018  . Diabetes mellitus without complication ([place])    Type II  . Diarrhea 11/25/2017  . Dyspnea    with activity  . Dysrhythmia    Afib  . Family history of breast cancer   . Family history of colon cancer   . Genetic testing 02/09/2017  . GERD (gastroesophageal reflux  disease)   . 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 Margot Chimes, thus far without recurrence.   Marland Kitchen History of MRI of lumbar spine 03/05/2014  . Hypertension   . Hypertrophy of prostate with urinary obstruction   . Hypomagnesemia 03/16/2017  . Metabolic encephalopathy 85/05/7739  . Metastatic adenocarcinoma to prostate ([place]) 09/08/2013  . 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 NHL (non-Hodgkin's lymphoma) ([place]) 2005   Diffuse large B-cell lymphoma, clinically stage IIIA, CD20 positive, status post cervical lymph node biopsy 08/03/2003 on the left. PET scan was also positive in the spleen and small bowel region, but bone marrow aspiration and biopsy were negative. So he essentially had stage IIIAs. He received R-CHOP x6 cycles with CR established by PET scan criteria on 11/23/2003 with no evidence for relapse th  . Numbness 02/19/2017   Per [facility] new patient packet  . Osteomyelitis ([place]) 06/29/2017   toe of left foot   . PAD (peripheral artery disease) ([place]) 08/27/2017  . Polyneuropathy 02/19/2017  . Postural dizziness with presyncope 02/19/2017  . S/P TURP 08/10/2013  . Sepsis ([place]) 07/02/2018  . Skin cancer    Basal cell- face,head, neck,  arms, legs, Back  . Urinary frequency 02/19/2017    Past  Surgical History:  Procedure Laterality Date  . ABDOMINAL AORTOGRAM W/LOWER EXTREMITY N/A 09/20/2019   Procedure: ABDOMINAL AORTOGRAM W/LOWER EXTREMITY;  Surgeon: Marty Heck, MD;  Location: [facility];  Service: Cardiovascular;  Laterality: N/A;  . AMPUTATION Left 06/29/2017   Procedure: AMPUTATION LEFT GREAT TOE;  Surgeon: [name], MD;  Location: [facility];  Service: Vascular;  Laterality: Left;  . AMPUTATION Left 08/27/2017    Procedure: AMPUTATION Left SECOND TOE;  Surgeon: [name], MD;  Location: [facility];  Service: Vascular;  Laterality: Left;  . CATARACT EXTRACTION Bilateral 2015  . CHOLECYSTECTOMY    . COLON RESECTION     small bowel  . CYSTOSCOPY WITH LITHOLAPAXY N/A 08/10/2013   Procedure: CYSTOSCOPY WITH LITHOLAPAXY WITH [name];  Surgeon: [name], MD;  [facility];  Service: Urology;  Laterality: N/A;  . ESOPHAGEAL DILATION N/A 02/27/2016   Procedure: ESOPHAGEAL DILATION;  Surgeon: [name], MD;  [facility];  Service: Endoscopy;  Laterality: N/A;  . ESOPHAGEAL DILATION N/A 07/07/2018   Procedure: ESOPHAGEAL DILATION;  Surgeon: [name], MD;  [facility];  Service: Endoscopy;  Laterality: N/A;  . ESOPHAGOGASTRODUODENOSCOPY N/A 02/27/2016   Procedure: ESOPHAGOGASTRODUODENOSCOPY (EGD);  Surgeon: [name], MD;  [facility];  Service: Endoscopy;  Laterality: N/A;  12:00  . ESOPHAGOGASTRODUODENOSCOPY N/A 07/07/2018   Procedure: ESOPHAGOGASTRODUODENOSCOPY (EGD);  Surgeon: [name], MD;  [facility];  Service: Endoscopy;  Laterality: N/A;  . INCISION AND DRAINAGE PERIRECTAL ABSCESS    . LOWER EXTREMITY ANGIOGRAPHY N/A 06/25/2017   Procedure: LOWER EXTREMITY ANGIOGRAPHY;  Surgeon: [name], MD;  Location: [facility];  Service: Cardiovascular;  Laterality: N/A;  . LYMPH NODE DISSECTION Left    '05-neck  . PERIPHERAL VASCULAR ATHERECTOMY Right 09/20/2019   Procedure: PERIPHERAL VASCULAR ATHERECTOMY;  Surgeon: Marty Heck, MD;  Location: [facility];  Service: Cardiovascular;  Laterality: Right;  Posterior tibial  . PORT-A-CATH REMOVAL    . PORTACATH PLACEMENT     insertion and removal -last chemotherapy 10 yrs ago  . TRANSURETHRAL RESECTION OF PROSTATE N/A 08/10/2013   Procedure: TRANSURETHRAL RESECTION OF THE PROSTATE WITH GYRUS INSTRUMENTS;  Surgeon: [name], MD;  [facility];  Service: Urology;  Laterality: N/A;    Family History  Problem Relation Age of Onset  . Heart attack Mother        died in her 27s  . Other Father 63       pneumonia - died  . Breast cancer Sister        dx in her 54s-60s  . Colon cancer Brother        dx in his 43s  . Cancer Sister        TAH/BSO due to "male cancer"  . Breast cancer Daughter 45  . Breast cancer Daughter 17       DCIS - ATM VUS on [employer] 83 gene panel in 2018  . Breast cancer Daughter 35       DCIS - Negative on My[employer] MyRisk 25 gene apnel in 2015  . Thyroid cancer Daughter 85       Medullary and Papillary  . Thyroid nodules Grandchild     SOCIAL HISTORY: Social History   Tobacco Use  . Smoking status: Never Smoker  . Smokeless tobacco: Never Used  Substance Use Topics  . Alcohol use: No    Allergies  Allergen  Reactions  . Tape Other (See Comments)    SKIN IS VERY THIN AND WILL TEAR AND BRUISE VERY EASILY!!!!  . Augmentin [Amoxicillin-Pot Clavulanate] Rash and Other (See Comments)    Redness of skin    Current Outpatient Medications  Medication Sig Dispense Refill  . abiraterone acetate (ZYTIGA) 250 MG tablet Take 250 mg by mouth daily before breakfast. Take on an empty stomach 1 hour before or 2 hours after a meal     . acetaminophen (TYLENOL) 500 MG tablet Take 1 tablet (500 mg total) by mouth 3 (three) times daily. 30 tablet 0  . albuterol (PROVENTIL) (2.5 MG/3ML) 0.083% nebulizer solution Take 2.5 mg by nebulization every 6 (six) hours as needed for wheezing or shortness of breath.    Marland Kitchen albuterol (VENTOLIN HFA) 108 (90 Base) MCG/ACT inhaler Inhale into the lungs every 6 (six) hours as needed for wheezing or shortness of breath.    Marland Kitchen apixaban (ELIQUIS) 5 MG TABS tablet Take 5 mg by mouth 2 (two) times daily.    . blood glucose meter kit and supplies Dispense based on patient and insurance preference. Use four times daily as directed. (FOR ICD-10 E10.9, E11.9). 1 each 0  . Calcium  Polycarbophil (FIBER-CAPS PO) Take 2-3 capsules by mouth See admin instructions. Take 3 capsules by mouth in the morning & take 2 capsules by mouth in the evening.    . cephALEXin (KEFLEX) 250 MG capsule Take 1 capsule (250 mg total) by mouth 2 (two) times daily. 28 capsule 0  . Cholecalciferol (VITAMIN D3) 1000 units CAPS Take 1,000 Units by mouth daily.     . clopidogrel (PLAVIX) 75 MG tablet Take 1 tablet (75 mg total) by mouth daily. 30 tablet 11  . clotrimazole-betamethasone (LOTRISONE) cream Apply 1 application topically 2 (two) times daily as needed (skin irriation.).     Marland Kitchen Cranberry 500 MG TABS Take 500 mg by mouth daily.     Marland Kitchen denosumab (XGEVA) 120 MG/1.7ML SOLN injection Inject 120 mg into the skin every 30 (thirty) days.    . diclofenac Sodium (VOLTAREN) 1 % GEL Apply 2 g topically 4 (four) times daily. 150 g 0  . diltiazem (CARDIZEM) 60 MG tablet Take 60 mg by mouth 2 (two) times daily.    Marland Kitchen donepezil (ARICEPT) 10 MG tablet Take 1 tablet by mouth once daily (Patient taking differently: Take 10 mg by mouth daily. ) 90 tablet 0  . feeding supplement, ENSURE ENLIVE, (ENSURE ENLIVE) LIQD Take 237 mLs by mouth daily. CHOCOLATE     . fluticasone (FLONASE) 50 MCG/ACT nasal spray Place 2 sprays into both nostrils daily as needed (allergies.).     Marland Kitchen furosemide (LASIX) 20 MG tablet Take 20 mg by mouth daily as needed (retention/swelling.).     Marland Kitchen glipiZIDE (GLUCOTROL XL) 5 MG 24 hr tablet Take 5 mg by mouth daily with breakfast.     . glucose blood (EASYMAX TEST) test strip Use as instructed 100 each 12  . Lancets 28G MISC 1 kit by Does not apply route every morning. 100 each 1  . Leuprolide Acetate, 6 Month, (LUPRON DEPOT, 58-MONTH,) 45 MG injection Inject 45 mg into the muscle every 6 (six) months.    . metFORMIN (GLUCOPHAGE) 850 MG tablet Take 850 mg by mouth 2 (two) times daily with a meal.     . mirtazapine (REMERON) 15 MG tablet Take 1 tablet (15 mg total) by mouth at bedtime. 30 tablet 0  .  Multiple Vitamin (MULTIVITAMIN  WITH MINERALS) TABS tablet Take 1 tablet by mouth daily.     . predniSONE (DELTASONE) 5 MG tablet Take 5 mg tablet by mouth twice a day for 3 days then resume home dose of 2.5 mg twice a day. 30 tablet 0  . Probiotic Product (PROBIOTIC DAILY PO) Take 1 tablet by mouth daily. Every morning    . senna (SENOKOT) 8.6 MG TABS tablet Take 1 tablet (8.6 mg total) by mouth 2 (two) times daily. 120 tablet 0  . Simethicone 125 MG CAPS Take 125 mg by mouth at bedtime.     Marland Kitchen trimethoprim (TRIMPEX) 100 MG tablet Take 100 mg by mouth at bedtime.      No current facility-administered medications for this visit.    REVIEW OF SYSTEMS:  [X] denotes positive finding, [ ] denotes negative finding Cardiac  Comments:  Chest pain or chest pressure:    Shortness of breath upon exertion:    Short of breath when lying flat:    Irregular heart rhythm:        Vascular    Pain in calf, thigh, or hip brought on by ambulation:    Pain in feet at night that wakes you up from your sleep:     Blood clot in your veins:    Leg swelling:         Pulmonary    Oxygen at home:    Productive cough:     Wheezing:         Neurologic    Sudden weakness in arms or legs:     Sudden numbness in arms or legs:     Sudden onset of difficulty speaking or slurred speech:    Temporary loss of vision in one eye:     Problems with dizziness:         Gastrointestinal    Blood in stool:     Vomited blood:         Genitourinary    Burning when urinating:     Blood in urine:        Psychiatric    Major depression:         Hematologic    Bleeding problems:    Problems with blood clotting too easily:        Skin    Rashes or ulcers:        Constitutional    Fever or chills:      PHYSICAL EXAM: Vitals:   09/26/19 1118  BP: 135/75  Pulse: 85  Resp: 18  Temp: (!) 97.2 F (36.2 C)  TempSrc: Temporal  SpO2: 100%  Weight: 140 lb (63.5 kg)  Height: 5' 11" (1.803 m)    GENERAL: The  patient is a well-nourished male, in no acute distress. The vital signs are documented above. CARDIAC: There is a regular rate and rhythm.  VASCULAR:  Palpable femoral pulses bilaterally Right dorsalis pedis, peroneal brisk by Doppler; right PT monophasic Some atheroembolism to the right heel as pictured below Dry ulcer right great toe Left groin with ecchymosis and bruising ABDOMEN: Soft and non-tender with normal pitched bowel sounds.  MUSCULOSKELETAL: There are no major deformities or cyanosis. NEUROLOGIC: No focal weakness or paresthesias are detected.       DATA:   None  Assessment/Plan:  86 year old male who presented with critical limb ischemia of the right lower extremity with a nonhealing wound to the great toe.  Ultimately he had severe tibial disease and dominant runoff was through the anterior  tibial artery that was patent down to the ankle but had very poor outflow due to small vessel disease.  Ultimately underwent posterior tibial intervention for severely calcified artery with chronic total occlusion distally and likely had some atheroembolism to the heel.  He has been having intermittent pain at home in the right leg since intervention.  Still has DP, peroneal, PT signals.  Discussed with him and his daughter that there is nothing else from a vascular standpoint to improve his inflow and if his toe progress or heel progresses would have to entertain palliative above-knee amputation.  He could probably heal a below knee amputation since has a popliteal pulse but not sure the functional benefit of that.  After long discussion with family decided to work on pain control and give time for demarcation. I sent a prescription for tramadol to his pharmacy.  I will arrange follow-up in 2 weeks.   Marty Heck, MD [facility] Office: [phone]

## 2019-09-26 NOTE — Telephone Encounter (Signed)
Called to notify pt that his pt assistance Eliquis has arrived in office.

## 2019-09-26 NOTE — Telephone Encounter (Signed)
Called and spoke with daughter. She stated that patient is being seen at [facility] for his Hospital Discharge problems. Stated that they were there now being seen. Daughter did not want to make an appointment at this time for patient. Stated that she would call back once things settled down and she checked her calendar.

## 2019-09-26 NOTE — Telephone Encounter (Signed)
Transition Care Management Follow-up Telephone Call  Date of discharge and from where:09/23/2019 [facility]  How have you been since you were released from the hospital? Not good, have been having vascular pain and the procedure done in hospital has caused injury to the foot. [facility] is talking about possible amputation.  Any questions or concerns? Yes   (above Concern) Items Reviewed:  Did the pt receive and understand the discharge instructions provided? Yes   Medications obtained and verified? Yes   Any new allergies since your discharge? No   Dietary orders reviewed? Yes  Do you have support at home? Yes   Other (ie: DME, Home Health, etc) Home [facility]  Functional Questionnaire: (I = Independent and D = Dependent) ADL's: I with assistance WC/walker  Bathing/Dressing- I with assistance   Meal Prep- D  Eating- I  Maintaining continence- I  Transferring/Ambulation- I with assistance but using the wheelchair more  Managing Meds- D   Follow up appointments reviewed:    PCP Hospital f/u appt confirmed? Yes  Scheduled to see [name] on 10/06/2019.  [facility] f/u appt confirmed? Yes  Scheduled to see [facility] on 10/10/19.  Are transportation arrangements needed? No   If their condition worsens, is the pt aware to call  their PCP or go to the ED? Yes  Was the patient provided with contact information for the PCP's office or ED? Yes  Was the pt encouraged to call back with questions or concerns? Yes

## 2019-09-27 ENCOUNTER — Other Ambulatory Visit: Payer: Self-pay | Admitting: Vascular Surgery

## 2019-09-27 MED ORDER — NITROGLYCERIN 0.2 MG/HR TD PT24: 0.2000 mg | MEDICATED_PATCH | Freq: Every day | TRANSDERMAL | 12 refills | Status: DC

## 2019-09-27 NOTE — Telephone Encounter (Signed)
Sent prescription to pharmacy for nitroglycerin patch to right foot at request of daughter and home health nurse.

## 2019-09-28 ENCOUNTER — Telehealth: Payer: Self-pay

## 2019-09-28 ENCOUNTER — Telehealth: Payer: Self-pay | Admitting: Urology

## 2019-09-28 ENCOUNTER — Telehealth (HOSPITAL_COMMUNITY): Payer: Self-pay | Admitting: Licensed Clinical Social Worker

## 2019-09-28 NOTE — Progress Notes (Signed)
Heart and Vascular Care Navigation  09/28/2019  [name] 1923-12-11 [id number]  Reason for Referral: [name] concerns regarding resources to assist [name] following potential amputation.                                                                                                    Assessment:  CSW called [name] to discuss options for assistance following likely AKA for [name].  [name], [name], reports that [name] lives at home with his wife who is also elderly and has some care needs (was just discharged from SNF following a hospital stay).  [name] currently has 13hours/week through [name] VA benefit and $100/day towards home care through a LTC insurance plan.  [name] also reports she is currently staying with the [name] and wife since they have been having concerns and assists as needed around the house.  CSW and [name] discussed multiple possible options to help [name] after possible AKA.  First thing discussed was possible need for a SNF stay.  CSW explained that in the hospital [name]/OT would see [name] and recommend a level of care and that with [name] age and with a new amputation I suspected that a SNF stay would be recommended.  Dtr is familiar with SNF from her moms recent stay [facility] and understands that this might be the safest option depending on [name] care needs following surgery.  CSW discussed speaking with [facility] admissions when a surgical date is set so they can try and preregister him for rehab.  [name] dtr expressed understanding but is concerned about [name] mental state in a SNF so is unsure if this is the route she would like to go at this time.  CSW then discussed home options.  Dtrs main concern is transferring [name]- CSW discussed possible equipment options such as a hoyer lift and explained that equipment recommendations would be made by [name]/OT and the inpatient staff would assist with ordering/insurance authorization.   CSW also discussed dtr calling the VA to discuss increasing his  aid hours based off of new care needs following amputation- [name] dtr states she just did this a few weeks ago so is familiar with the process.       [name] already set up for home health through [facility] - gets [name] 2-3 times per week at this time.  [name] dtr states that he has been getting this about 2 years because every time they consider discharging something new happens and it gets renewed.                               HRT/VAS Care Coordination    Outpatient Care Team Social Worker   Social Worker Name: Tammy Sours- hrt/vas Care Coordination team- [phone]   Living arrangements for the past 2 months Single Family Home   Lives with: Spouse  Adult children live there when needed but are not techniqually permanent residents   Patient Current Insurance Coverage VA; Traditional Medicare   Patient Has Concern With Paying Medical  Bills No   Does Patient Have Prescription Coverage? Yes   Home Assistive Devices/Equipment Walker (specify type); Wheelchair; Shower chair without back; Grab bars around toilet; Eyeglasses   DME Agency NA   [facility] [name]; Other (comment)  gets 13 hours of home aid services per week through the [place] and has long term care insurance that provides $100/day for home aid services- aids currently coming out M-F in the mornings      Social History:                                                                             SDOH Screenings   Alcohol Screen:   . Last Alcohol Screening Score (AUDIT):   Depression (PHQ2-9): Low Risk   . PHQ-2 Score: 0  Financial Resource Strain:   . Difficulty of Paying Living Expenses:   Food Insecurity:   . Worried About Charity fundraiser in the Last Year:   . [employer] of Food in the Last Year:   Housing: [place]   . Last Housing Risk Score: 0  Physical Activity:   . Days of Exercise per Week:   . Minutes of Exercise per Session:   Social Connections:   . Frequency of Communication with  Friends and Family:   . Frequency of Social Gatherings with Friends and Family:   . Attends Religious Services:   . Active Member of Clubs or Organizations:   . Attends Archivist Meetings:   Marland Kitchen Marital Status:   Stress:   . Feeling of Stress :   Tobacco Use: Low Risk   . Smoking Tobacco Use: Never Smoker  . Smokeless Tobacco Use: Never Used  Transportation Needs:   . Film/video editor (Medical):   Marland Kitchen Lack of Transportation (Non-Medical):     Follow-up plan:    Dtr to call VA to discuss increased aid hours and will speak with admissions at [facility] regarding possible admission following surgery if that is what they end up doing.  No further needs from CSW at this time but encouraged [name] dtr to reach out with any questions or concerns.  Jorge Ny, LCSW Clinical Social Worker [facility] Desk#: [phone] Cell#: [phone]

## 2019-09-28 NOTE — Telephone Encounter (Signed)
Spoke with daughter [name] who report that pt under went right leg intervention d/t blood clot. Daughter was told by vascular pt would need amputation to leg. Daughter was told by Hospice nurse that a NTG patch would help increase blood flow to that leg. Daughter reviewed with vascular and she states states that they stated that they would try the NGT patch. Daughter would like your advice on if you feel the NGT patch will help with the blood flow to this leg. Please advise.

## 2019-09-28 NOTE — Telephone Encounter (Signed)
Daughter called, requesting refill on Trimethoprim 100mg  be sent to [facility] [address]

## 2019-09-28 NOTE — Telephone Encounter (Signed)
Daughter-Cathy has questions about Pt's medications and stated it was urgent  Please call [phone]  Thanks renee

## 2019-09-29 ENCOUNTER — Ambulatory Visit (INDEPENDENT_AMBULATORY_CARE_PROVIDER_SITE_OTHER): Payer: Medicare Other | Admitting: Physician Assistant

## 2019-09-29 ENCOUNTER — Other Ambulatory Visit: Payer: Self-pay

## 2019-09-29 ENCOUNTER — Encounter (HOSPITAL_COMMUNITY): Payer: Medicare Other

## 2019-09-29 VITALS — BP 134/76 | HR 88 | Temp 97.3°F | Resp 16

## 2019-09-29 DIAGNOSIS — I739 Peripheral vascular disease, unspecified: Secondary | ICD-10-CM | POA: Diagnosis not present

## 2019-09-29 DIAGNOSIS — L97509 Non-pressure chronic ulcer of other part of unspecified foot with unspecified severity: Secondary | ICD-10-CM | POA: Insufficient documentation

## 2019-09-29 DIAGNOSIS — L97519 Non-pressure chronic ulcer of other part of right foot with unspecified severity: Secondary | ICD-10-CM | POA: Diagnosis not present

## 2019-09-29 NOTE — Telephone Encounter (Signed)
Daughter [name] notified and voiced understanding

## 2019-09-29 NOTE — Telephone Encounter (Signed)
I think applying a nitro patch as recommended by vascular is fine, it can help relieve pain by dialating the smaller arteries to increase blood flow   Zandra Abts MD

## 2019-09-29 NOTE — Progress Notes (Signed)
Established Patient   History of Present Illness   [name] is a 86 y.o. (February 10, 1924) male who presents with skin changes of right heel.  He recently underwent right PTA atherectomy and angioplasty by Dr. Carlis Abbott on 09/20/2019.  He was last seen in the office 3 days ago by Dr. Carlis Abbott.  Skin changes of right heel were noted during this visit and the plan was to allow time for demarcation with follow-up in 2 weeks.  This morning the home health nurse was concerned about the appearance of his right foot and suggested that it was also cold to touch.  The patient states he has not had any pain in his right leg over the past 24 hours.  He was able to walk from his kitchen to his recliner without any pain with weightbearing.  He continues to work with therapy teams at home.  He is taking Eliquis for atrial fibrillation.    The patient and his daughter also note swelling of left lower extremity.  He continues to have local ecchymosis of left common femoral artery catheterization site.  He denies any pain to this area.  Swelling is not bothersome to the patient.  He denies any rest pain or tissue changes of left foot.  Current Outpatient Medications  Medication Sig Dispense Refill  . abiraterone acetate (ZYTIGA) 250 MG tablet Take 250 mg by mouth daily before breakfast. Take on an empty stomach 1 hour before or 2 hours after a meal     . acetaminophen (TYLENOL) 500 MG tablet Take 1 tablet (500 mg total) by mouth 3 (three) times daily. 30 tablet 0  . albuterol (PROVENTIL) (2.5 MG/3ML) 0.083% nebulizer solution Take 2.5 mg by nebulization every 6 (six) hours as needed for wheezing or shortness of breath.    Marland Kitchen albuterol (VENTOLIN HFA) 108 (90 Base) MCG/ACT inhaler Inhale into the lungs every 6 (six) hours as needed for wheezing or shortness of breath.    Marland Kitchen apixaban (ELIQUIS) 5 MG TABS tablet Take 5 mg by mouth 2 (two) times daily.    . blood glucose meter kit and supplies Dispense based on patient and  insurance preference. Use four times daily as directed. (FOR ICD-10 E10.9, E11.9). 1 each 0  . Calcium Polycarbophil (FIBER-CAPS PO) Take 2-3 capsules by mouth See admin instructions. Take 3 capsules by mouth in the morning & take 2 capsules by mouth in the evening.    . cephALEXin (KEFLEX) 250 MG capsule Take 1 capsule (250 mg total) by mouth 2 (two) times daily. 28 capsule 0  . Cholecalciferol (VITAMIN D3) 1000 units CAPS Take 1,000 Units by mouth daily.     . clopidogrel (PLAVIX) 75 MG tablet Take 1 tablet (75 mg total) by mouth daily. 30 tablet 11  . clotrimazole-betamethasone (LOTRISONE) cream Apply 1 application topically 2 (two) times daily as needed (skin irriation.).     Marland Kitchen Cranberry 500 MG TABS Take 500 mg by mouth daily.     Marland Kitchen denosumab (XGEVA) 120 MG/1.7ML SOLN injection Inject 120 mg into the skin every 30 (thirty) days.    . diclofenac Sodium (VOLTAREN) 1 % GEL Apply 2 g topically 4 (four) times daily. 150 g 0  . diltiazem (CARDIZEM) 60 MG tablet Take 60 mg by mouth 2 (two) times daily.    Marland Kitchen donepezil (ARICEPT) 10 MG tablet Take 1 tablet by mouth once daily (Patient taking differently: Take 10 mg by mouth daily. ) 90 tablet 0  . feeding supplement,  ENSURE ENLIVE, (ENSURE ENLIVE) LIQD Take 237 mLs by mouth daily. CHOCOLATE     . fluticasone (FLONASE) 50 MCG/ACT nasal spray Place 2 sprays into both nostrils daily as needed (allergies.).     Marland Kitchen furosemide (LASIX) 20 MG tablet Take 20 mg by mouth daily as needed (retention/swelling.).     Marland Kitchen glipiZIDE (GLUCOTROL XL) 5 MG 24 hr tablet Take 5 mg by mouth daily with breakfast.     . glucose blood (EASYMAX TEST) test strip Use as instructed 100 each 12  . Lancets 28G MISC 1 kit by Does not apply route every morning. 100 each 1  . Leuprolide Acetate, 6 Month, (LUPRON DEPOT, 7-MONTH,) 45 MG injection Inject 45 mg into the muscle every 6 (six) months.    . metFORMIN (GLUCOPHAGE) 850 MG tablet Take 850 mg by mouth 2 (two) times daily with a meal.      . mirtazapine (REMERON) 15 MG tablet Take 1 tablet (15 mg total) by mouth at bedtime. 30 tablet 0  . Multiple Vitamin (MULTIVITAMIN WITH MINERALS) TABS tablet Take 1 tablet by mouth daily.     . nitroGLYCERIN (NITRODUR - DOSED IN MG/24 HR) 0.2 mg/hr patch Place 1 patch (0.2 mg total) onto the skin daily. 30 patch 12  . predniSONE (DELTASONE) 5 MG tablet Take 5 mg tablet by mouth twice a day for 3 days then resume home dose of 2.5 mg twice a day. 30 tablet 0  . Probiotic Product (PROBIOTIC DAILY PO) Take 1 tablet by mouth daily. Every morning    . senna (SENOKOT) 8.6 MG TABS tablet Take 1 tablet (8.6 mg total) by mouth 2 (two) times daily. 120 tablet 0  . Simethicone 125 MG CAPS Take 125 mg by mouth at bedtime.     . traMADol (ULTRAM) 50 MG tablet Take 1 tablet (50 mg total) by mouth every 6 (six) hours as needed for moderate pain. 30 tablet 0  . trimethoprim (TRIMPEX) 100 MG tablet Take 100 mg by mouth at bedtime.      No current facility-administered medications for this visit.    REVIEW OF SYSTEMS (negative unless checked):   Cardiac:  []  Chest pain or chest pressure? []  Shortness of breath upon activity? []  Shortness of breath when lying flat? []  Irregular heart rhythm?  Vascular:  []  Pain in calf, thigh, or hip brought on by walking? []  Pain in feet at night that wakes you up from your sleep? []  Blood clot in your veins? []  Leg swelling?  Pulmonary:  []  Oxygen at home? []  Productive cough? []  Wheezing?  Neurologic:  []  Sudden weakness in arms or legs? []  Sudden numbness in arms or legs? []  Sudden onset of difficult speaking or slurred speech? []  Temporary loss of vision in one eye? []  Problems with dizziness?  Gastrointestinal:  []  Blood in stool? []  Vomited blood?  Genitourinary:  []  Burning when urinating? []  Blood in urine?  Psychiatric:  []  Major depression  Hematologic:  []  Bleeding problems? []  Problems with blood clotting?  Dermatologic:  []  Rashes  or ulcers?  Constitutional:  []  Fever or chills?  Ear/Nose/Throat:  []  Change in hearing? []  Nose bleeds? []  Sore throat?  Musculoskeletal:  []  Back pain? []  Joint pain? []  Muscle pain?   Physical Examination   Vitals:   09/29/19 1205  BP: 134/76  Pulse: 88  Resp: 16  Temp: (!) 97.3 F (36.3 C)  TempSrc: Temporal  SpO2: 100%   There is no height or weight on  file to calculate BMI.  General:  WDWN in NAD; vital signs documented above Gait: Not observed HENT: WNL, normocephalic Pulmonary: normal non-labored breathing , without Rales, rhonchi,  wheezing Cardiac: Irregular Abdomen: soft, NT, no masses Skin: without rashes Vascular Exam/Pulses: Left DP, PT, peroneal signals brisk by Doppler; right PT, DP, and peroneal signals by Doppler Extremities: Skin cracking with early blistering of medial heel as well as plantar surface of heel however no active bleeding erythema, or obvious sign of infection; persistent great toe ulceration dry without any purulent drainage; left groin without firm hematoma or palpable pseudoaneurysm Musculoskeletal: no muscle wasting or atrophy  Neurologic: A&O X 3;  No focal weakness or paresthesias are detected Psychiatric:  The pt has Normal affect.   Medical Decision Making   [name] is a 86 y.o. male who presents with concern for tissue changes of right heel   Patient maintains a DP, PT, and peroneal Doppler signal of the right lower extremity status post PTA atherectomy and angioplasty by Dr. Carlis Abbott on 09/20/2019  As was discussed during last office visit with Dr. Carlis Abbott, there may be some atheroemboli to the right heel which is causing tissue changes  At this time there is no indication of infection of right foot and patient is without any pain to the right foot as well  We will continue the plan to allow time for tissue demarcation with follow-up with Dr. Carlis Abbott in 2 weeks  I encouraged patient to offload right heel when not  working with therapy or walking  Patient will call/return to office sooner with any changes   Dagoberto Ligas PA-C [facility] Office: [facility] MD: Donzetta Matters

## 2019-09-29 NOTE — Telephone Encounter (Signed)
Spoke with pts drt. The last visit I can find for pt is 6/20. I tried to schedule an appt for pt and refill just till then. Drt says pt is going to 2 different drs frequently right now d/t a wound and circulation problems. She wants to know if a virtual visit can be scheduled. Told her I would talk with someone on Monday and we would call her back.

## 2019-10-02 ENCOUNTER — Encounter (HOSPITAL_BASED_OUTPATIENT_CLINIC_OR_DEPARTMENT_OTHER): Payer: Medicare Other | Admitting: Physician Assistant

## 2019-10-02 ENCOUNTER — Ambulatory Visit (INDEPENDENT_AMBULATORY_CARE_PROVIDER_SITE_OTHER): Payer: Medicare Other | Admitting: Gastroenterology

## 2019-10-02 ENCOUNTER — Telehealth: Payer: Self-pay

## 2019-10-02 NOTE — Telephone Encounter (Signed)
Can you call and offer a 1pnm virtual visit tomorrow?

## 2019-10-02 NOTE — Telephone Encounter (Signed)
Spoke with drt. Verified she was aware of 1:00 virtual ov tomorrow.

## 2019-10-02 NOTE — Telephone Encounter (Signed)
[name] spoke with a daughter and scheduled for 1pm

## 2019-10-03 ENCOUNTER — Telehealth (INDEPENDENT_AMBULATORY_CARE_PROVIDER_SITE_OTHER): Payer: Medicare Other | Admitting: Urology

## 2019-10-03 DIAGNOSIS — N3000 Acute cystitis without hematuria: Secondary | ICD-10-CM | POA: Diagnosis not present

## 2019-10-03 MED ORDER — TRIMETHOPRIM 100 MG PO TABS: 100.0000 mg | ORAL_TABLET | Freq: Every day | ORAL | 3 refills | Status: DC

## 2019-10-03 NOTE — Progress Notes (Addendum)
I connected with  [name] on 10/03/19 bytelemedicine application and verified that I am speaking with the correct person using two identifiers.   I discussed the limitations of evaluation and management by telemedicine. The patient expressed understanding and agreed to proceed.  Pt: Home MD: Office       H&P  Chief Complaint: Follow-up of prostate cancer  History of Present Illness: This gentleman has been doing well from prostate cancer standpoint.  He is followed by Dr. Raliegh Ip at the regional [facility] here in [place].  He is on abiraterone and androgen deprivation therapy.  Last PSA was undetectable in May of this year.  He has had issues with his right foot and needed recent revascularization.  Questionable viability of his toes on that side now.  No gross hematuria, no urinary complaints, no recent urinary tract infections.  Past Medical History:  Diagnosis Date  . Acute bronchitis   . Acute lower UTI 11/25/2017  . Amputation of toe of left foot ([place]) 07/30/2017, 08/27/2017    Great toe 07/30/2017 Second toe 08/27/2017   . Arthritis   . Aspiration pneumonia ([place]) 01/31/2018  . Atrial fibrillation ([place])    Dr. Harl Bowie- [facility] follows saw 11'14  . Bladder stones    tx. with oral meds and antibiotics, now surgery planned  . Bone metastases ([place]) 08/21/2015  . Cataracts, both eyes    surgery planned May 2015  . Community acquired pneumonia 04/16/2018  . Cystoid macular edema 04/21/2015    Right eye 04/21/2015   . Dehydration 04/16/2018  . Dementia without behavioral disturbance ([place]) 04/16/2018  . Diabetes mellitus without complication ([place])    Type II  . Diarrhea 11/25/2017  . Dyspnea    with activity  . Dysrhythmia    Afib  . Family history of breast cancer   . Family history of colon cancer   . Genetic testing 02/09/2017  . GERD (gastroesophageal reflux disease)   . 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 Margot Chimes, thus far without recurrence.   Marland Kitchen History of MRI of lumbar spine 03/05/2014  . Hypertension   . Hypertrophy of prostate with urinary obstruction   . Hypomagnesemia 03/16/2017  . Metabolic encephalopathy 57/84/6962  . Metastatic adenocarcinoma to prostate ([place]) 09/08/2013  . 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 NHL (non-Hodgkin's lymphoma) ([place]) 2005   Diffuse large B-cell lymphoma, clinically stage IIIA, CD20 positive, status post cervical lymph node biopsy 08/03/2003 on the left. PET scan was also positive in the spleen and small bowel region, but bone marrow aspiration and biopsy were negative. So he essentially had stage IIIAs. He received R-CHOP x6 cycles with CR established by PET scan criteria on 11/23/2003 with no evidence for relapse th  . Numbness 02/19/2017   Per [place] [facility] new patient packet  . Osteomyelitis ([place]) 06/29/2017   toe of left foot   . PAD (peripheral artery disease) ([place]) 08/27/2017  . Polyneuropathy 02/19/2017  . Postural dizziness with presyncope 02/19/2017  . S/P TURP 08/10/2013  . Sepsis ([place]) 07/02/2018  . Skin cancer    Basal cell- face,head, neck,  arms, legs, Back  . Urinary frequency 02/19/2017    Past Surgical History:  Procedure Laterality Date  . ABDOMINAL AORTOGRAM W/LOWER EXTREMITY N/A 09/20/2019  Procedure: ABDOMINAL AORTOGRAM W/LOWER EXTREMITY;  Surgeon: Marty Heck, MD;  Location: [facility];  Service: Cardiovascular;  Laterality: N/A;  . AMPUTATION Left 06/29/2017   Procedure: AMPUTATION LEFT GREAT TOE;  Surgeon: [name], MD;  Location: [facility];  Service: Vascular;  Laterality: Left;  . AMPUTATION Left 08/27/2017   Procedure: AMPUTATION Left SECOND TOE;  Surgeon: [name], MD;  Location: [facility];  Service:  Vascular;  Laterality: Left;  . CATARACT EXTRACTION Bilateral 2015  . CHOLECYSTECTOMY    . COLON RESECTION     small bowel  . CYSTOSCOPY WITH LITHOLAPAXY N/A 08/10/2013   Procedure: CYSTOSCOPY WITH LITHOLAPAXY WITH [name];  Surgeon: [name], MD;  Location: [facility];  Service: Urology;  Laterality: N/A;  . ESOPHAGEAL DILATION N/A 02/27/2016   Procedure: ESOPHAGEAL DILATION;  Surgeon: [name], MD;  [facility];  Service: Endoscopy;  Laterality: N/A;  . ESOPHAGEAL DILATION N/A 07/07/2018   Procedure: ESOPHAGEAL DILATION;  Surgeon: [name], MD;  [facility];  Service: Endoscopy;  Laterality: N/A;  . ESOPHAGOGASTRODUODENOSCOPY N/A 02/27/2016   Procedure: ESOPHAGOGASTRODUODENOSCOPY (EGD);  Surgeon: [name], MD;  [facility];  Service: Endoscopy;  Laterality: N/A;  12:00  . ESOPHAGOGASTRODUODENOSCOPY N/A 07/07/2018   Procedure: ESOPHAGOGASTRODUODENOSCOPY (EGD);  Surgeon: [name], MD;  [facility];  Service: Endoscopy;  Laterality: N/A;  . INCISION AND DRAINAGE PERIRECTAL ABSCESS    . LOWER EXTREMITY ANGIOGRAPHY N/A 06/25/2017   Procedure: LOWER EXTREMITY ANGIOGRAPHY;  Surgeon: [name], MD;  Location: [facility];  Service: Cardiovascular;  Laterality: N/A;  . LYMPH NODE DISSECTION Left    '05-neck  . PERIPHERAL VASCULAR ATHERECTOMY Right 09/20/2019   Procedure: PERIPHERAL VASCULAR ATHERECTOMY;  Surgeon: Marty Heck, MD;  Location: [facility];  Service: Cardiovascular;  Laterality: Right;  Posterior tibial  . PORT-A-CATH REMOVAL    . PORTACATH PLACEMENT     insertion and removal -last chemotherapy 10 yrs ago  . TRANSURETHRAL RESECTION OF PROSTATE N/A 08/10/2013   Procedure: TRANSURETHRAL RESECTION OF THE PROSTATE WITH GYRUS INSTRUMENTS;  Surgeon: [name], MD;  Location: [facility];  Service: Urology;  Laterality: N/A;    Home Medications:  Allergies as of 10/03/2019       Reactions   Tape Other (See Comments)   SKIN IS VERY THIN AND WILL TEAR AND BRUISE VERY EASILY!!!!   Augmentin [amoxicillin-pot Clavulanate] Rash, Other (See Comments)   Redness of skin      Medication List       Accurate as of October 03, 2019  1:16 PM. If you have any questions, ask your nurse or doctor.        acetaminophen 500 MG tablet Commonly known as: TYLENOL Take 1 tablet (500 mg total) by mouth 3 (three) times daily.   albuterol 108 (90 Base) MCG/ACT inhaler Commonly known as: VENTOLIN HFA Inhale into the lungs every 6 (six) hours as needed for wheezing or shortness of breath.   albuterol (2.5 MG/3ML) 0.083% nebulizer solution Commonly known as: PROVENTIL Take 2.5 mg by nebulization every 6 (six) hours as needed for wheezing or shortness of breath.   blood glucose meter kit and supplies Dispense based on patient and insurance preference. Use four times daily as directed. (FOR ICD-10 E10.9, E11.9).   cephALEXin 250 MG capsule Commonly known as: KEFLEX Take 1 capsule (250 mg total) by mouth 2 (two) times daily.   clopidogrel  75 MG tablet Commonly known as: Plavix Take 1 tablet (75 mg total) by mouth daily.   clotrimazole-betamethasone cream Commonly known as: LOTRISONE Apply 1 application topically 2 (two) times daily as needed (skin irriation.).   Cranberry 500 MG Tabs Take 500 mg by mouth daily.   denosumab 120 MG/1.7ML Soln injection Commonly known as: XGEVA Inject 120 mg into the skin every 30 (thirty) days.   diclofenac Sodium 1 % Gel Commonly known as: VOLTAREN Apply 2 g topically 4 (four) times daily.   diltiazem 60 MG tablet Commonly known as: CARDIZEM Take 60 mg by mouth 2 (two) times daily.   donepezil 10 MG tablet Commonly known as: ARICEPT Take 1 tablet by mouth once daily   EasyMax Test test strip Generic drug: glucose blood Use as instructed   Eliquis 5 MG Tabs tablet Generic drug: apixaban Take 5 mg by mouth 2 (two) times daily.     feeding supplement (ENSURE ENLIVE) Liqd Take 237 mLs by mouth daily. CHOCOLATE   FIBER-CAPS PO Take 2-3 capsules by mouth See admin instructions. Take 3 capsules by mouth in the morning & take 2 capsules by mouth in the evening.   fluticasone 50 MCG/ACT nasal spray Commonly known as: FLONASE Place 2 sprays into both nostrils daily as needed (allergies.).   furosemide 20 MG tablet Commonly known as: LASIX Take 20 mg by mouth daily as needed (retention/swelling.).   glipiZIDE 5 MG 24 hr tablet Commonly known as: GLUCOTROL XL Take 5 mg by mouth daily with breakfast.   Lancets 28G Misc 1 kit by Does not apply route every morning.   Lupron Depot (61-Month) 45 MG injection Generic drug: Leuprolide Acetate (6 Month) Inject 45 mg into the muscle every 6 (six) months.   metFORMIN 850 MG tablet Commonly known as: GLUCOPHAGE Take 850 mg by mouth 2 (two) times daily with a meal.   mirtazapine 15 MG tablet Commonly known as: Remeron Take 1 tablet (15 mg total) by mouth at bedtime.   multivitamin with minerals Tabs tablet Take 1 tablet by mouth daily.   nitroGLYCERIN 0.2 mg/hr patch Commonly known as: NITRODUR - Dosed in mg/24 hr Place 1 patch (0.2 mg total) onto the skin daily.   predniSONE 5 MG tablet Commonly known as: DELTASONE Take 5 mg tablet by mouth twice a day for 3 days then resume home dose of 2.5 mg twice a day.   PROBIOTIC DAILY PO Take 1 tablet by mouth daily. Every morning   senna 8.6 MG Tabs tablet Commonly known as: SENOKOT Take 1 tablet (8.6 mg total) by mouth 2 (two) times daily.   Simethicone 125 MG Caps Take 125 mg by mouth at bedtime.   traMADol 50 MG tablet Commonly known as: ULTRAM Take 1 tablet (50 mg total) by mouth every 6 (six) hours as needed for moderate pain.   trimethoprim 100 MG tablet Commonly known as: TRIMPEX Take 100 mg by mouth at bedtime.   Vitamin D3 25 MCG (1000 UT) Caps Take 1,000 Units by mouth daily.   Zytiga 250 MG  tablet Generic drug: abiraterone acetate Take 250 mg by mouth daily before breakfast. Take on an empty stomach 1 hour before or 2 hours after a meal       Allergies:  Allergies  Allergen Reactions  . Tape Other (See Comments)    SKIN IS VERY THIN AND WILL TEAR AND BRUISE VERY EASILY!!!!  . Augmentin [Amoxicillin-Pot Clavulanate] Rash and Other (See Comments)    Redness of  skin    Family History  Problem Relation Age of Onset  . Heart attack Mother        died in her 32s  . Other Father 61       pneumonia - died  . Breast cancer Sister        dx in her 1s-60s  . Colon cancer Brother        dx in his 58s  . Cancer Sister        TAH/BSO due to "male cancer"  . Breast cancer Daughter 44  . Breast cancer Daughter 82       DCIS - ATM VUS on [employer] 83 gene panel in 2018  . Breast cancer Daughter 68       DCIS - Negative on [employer] MyRisk 25 gene apnel in 2015  . Thyroid cancer Daughter 57       Medullary and Papillary  . Thyroid nodules Grandchild     Social History:  reports that he has never smoked. He has never used smokeless tobacco. He reports that he does not drink alcohol and does not use drugs.  ROS: Pain in right foot.  No gross hematuria.  No urinary complaints. Physical Exam:   Telemedicine visit, no exam performed.  I have reviewed prior pt notes  I have reviewed notes from referring/previous physicians  I have reviewed urinalysis results  I have independently reviewed prior imaging  I have reviewed prior PSA results  Impression/Assessment:  1.  Castrate resistant, progressive but stable prostate cancer, most recent PSA undetectable  2.  Urinary tract infections, no recent infections on trimethoprim suppression  Plan:  1.  He will continue cancer follow-up with the [facility]  2.  I refilled his trimethoprim.  17 mins spent on call

## 2019-10-04 ENCOUNTER — Telehealth: Payer: Self-pay

## 2019-10-04 NOTE — Telephone Encounter (Signed)
Telephone call received from daughter [name] with concerns of tiny blood spots coming from crack in Rt heel. She denies any continuous oozing. Recommended to continue to offload heels when not walking or working with therapy. Reports she also purchased a lambs wool heel protector which is more comfortable to wear than ortho boot. Pt will wear this when up walking.  Reports pt has scattered bruises on arms. Pt is taking Eliquis and Plavix. Advised to report any excessive bleeding, bruising, bleeding gums, dark stools, or blood in urine.  Reports pt will run out of Keflex prescription soon. Advised once prescription completed, no refills necessary. [name] voiced understanding to all information provided.

## 2019-10-05 ENCOUNTER — Other Ambulatory Visit: Payer: Self-pay | Admitting: *Deleted

## 2019-10-05 NOTE — Progress Notes (Signed)
My chart message give to Arlee Muslim PA to answer all questions.

## 2019-10-05 NOTE — Telephone Encounter (Signed)
Eliquis and plavix are both blood thinners but work very differently with different purposes. The eliquis is needed to lower his risk of stroke given his afib, plavix is not very good at that. It looks like the plavix was started during recent issues with the arteries in his legs to help keep them open, which plavix is very good at. There are certain times when we will have patients on both eliquis and plavix and I would say given his afib and peripheral vascular disease this is appropriate, in the absent of significant bleeding I would continue both.   Zandra Abts MD

## 2019-10-05 NOTE — Telephone Encounter (Signed)
On days he takes his lasix I would take 42mEq of KCl   Zandra Abts MD

## 2019-10-06 ENCOUNTER — Other Ambulatory Visit: Payer: Self-pay | Admitting: *Deleted

## 2019-10-06 ENCOUNTER — Telehealth (INDEPENDENT_AMBULATORY_CARE_PROVIDER_SITE_OTHER): Payer: Medicare Other | Admitting: Nurse Practitioner

## 2019-10-06 ENCOUNTER — Encounter: Payer: Self-pay | Admitting: Nurse Practitioner

## 2019-10-06 ENCOUNTER — Telehealth: Payer: Self-pay

## 2019-10-06 ENCOUNTER — Other Ambulatory Visit: Payer: Self-pay

## 2019-10-06 DIAGNOSIS — I739 Peripheral vascular disease, unspecified: Secondary | ICD-10-CM

## 2019-10-06 DIAGNOSIS — I10 Essential (primary) hypertension: Secondary | ICD-10-CM

## 2019-10-06 DIAGNOSIS — E440 Moderate protein-calorie malnutrition: Secondary | ICD-10-CM

## 2019-10-06 DIAGNOSIS — I480 Paroxysmal atrial fibrillation: Secondary | ICD-10-CM

## 2019-10-06 DIAGNOSIS — E1169 Type 2 diabetes mellitus with other specified complication: Secondary | ICD-10-CM | POA: Diagnosis not present

## 2019-10-06 DIAGNOSIS — F419 Anxiety disorder, unspecified: Secondary | ICD-10-CM

## 2019-10-06 NOTE — Telephone Encounter (Signed)
[name], litsey are scheduled for a virtual visit with your provider today.    Just as we do with appointments in the office, we must obtain your consent to participate.  Your consent will be active for this visit and any virtual visit you may have with one of our providers in the next 365 days.    If you have a MyChart account, I can also send a copy of this consent to you electronically.  All virtual visits are billed to your insurance company just like a traditional visit in the office.  As this is a virtual visit, video technology does not allow for your provider to perform a traditional examination.  This may limit your provider's ability to fully assess your condition.  If your provider identifies any concerns that need to be evaluated in person or the need to arrange testing such as labs, EKG, etc, we will make arrangements to do so.    Although advances in technology are sophisticated, we cannot ensure that it will always work on either your end or our end.  If the connection with a video visit is poor, we may have to switch to a telephone visit.  With either a video or telephone visit, we are not always able to ensure that we have a secure connection.   I need to obtain your verbal consent now.   Are you willing to proceed with your visit today?   [name] has provided verbal consent on 10/06/2019 for a virtual visit (video or telephone).   Leigh Aurora [place], [place] 10/06/2019  11:30 am

## 2019-10-06 NOTE — Patient Outreach (Signed)
[facility]) Care Management  10/06/2019  [name] Jan 18, 1924 [id number]   Subjective: Telephone call to patient's home / mobile number, spoke with patient's daughter/ designated party release [name]), stated patient's name, date of birth, and address.   Discussed [facility] Care Management Medicare  EMMI General Discharge Red Flag Alert follow up, daughter voiced understanding, and is in agreement to follow up on patient's behalf.  Daughter states all the EMMI Red Flag alert questions have been addressed by patient's providers.  States she remembers receiving EMMI automated calls and has been answering questions on patient's behalf.  Daughter gave RNCM the following verbal update  on patient's status and treatment plan to date: patient lives with wife, family is providing 24 hour care / supervision, has home health services through [facility], home health RN will continue weekly visits until end of July 2021, continues to address wound care, wound care may change with foot status, and prognosis. States patient has been evaluated for Palliative Care services and services to begin in the near future. States patient not eligible for [employer] and Attendance benefit due to exceeding the income limit and is eligible for the aide benefit through the [employer].   States patient has 13 hours of aide services through his [employer] benefit, is eligible for additional hours (up to approximately 21- 23 hours per week) but current agency ( [facility], [phone]) is unable to provide caregivers that patient's wife will allow to come into their home.  Daughter states patient's wife is very selective on who she will allow to come into their home and provide care, not able to utilize approved hours due to wife's caregiver preference.   States patient also receives 6 hours a day of caregiver services also through [facility], utilizing  patient's long term policy benefit.   States patient is doing okay, has a clear mind, walking as needed,  utilizes transport chair as needed, worrying a lot about his foot, they have discussed additional "worry medication" with primary MD, is already on 2 medications, MD does not want to add another medication at this time, and MD will monitor to see if additional medications needed, or if medications need to be adjusted in the future. States patient had follow up appointment with primary MD on 10/06/2019 and with surgeon on 09/29/2019, appointments went well.   States patient will have follow up appointment with surgeon on 10/10/2019 and this appointment will determine next steps.  Daughter states patient will also have a follow up appointment with wound care center / MD on 10/13/2019 and with cardiologist on 12/14/2019.  States patient's right heel is also breaking down, swelling, hematoma, bruising of left lower extremity is improving.  States patient was discharged from home health Physical Therapy earlier today, nothing else they could do for patient, and willing to reopen in the future if needed.  States even though patient has been discharged, home health physical therapy,  therapist did give she and patient some hope, by suggesting  daughter obtain [employer]co Heel Wedge Healing Shoe, to assist with possible heel wound healing.  Daughter has researched where to obtain the healing shoe, states she may be able to obtain from [facility], has also contacted [facility] advised daughter to discuss this with vascular MD for recommendations, daughter voices understanding, and is in agreement.  Daughter states physical therapist has also suggested obtaining a scooter for patient and is planning to discuss this with vascular MD on  10/10/2019.  RNCM advised daughter of the general process for obtainging scooter, daughter voices understanding, and will discuss with patient's vascular MD.   Daughter is aware that RNCM may  be able to assist with care coordination if needed in the future, and will notify RNCM if assistance needed.   Daughter states patient is able to manage some self care and has assistance as needed. States she is accessing patient's  Medicare benefits on patient's behalf,  as needed via member services number on back of card.   Daughter states patient does not have any education material, EMMI follow up,  transportation, [employer], or pharmacy needs at this time.  States she is very appreciative of the follow up, is in agreement  to receive 1 additional follow up call to assess for further CM needs on patient's behalf, and is in agreement to receive [facility] EMMI follow up calls as needed.     Objective: Per KPN (Knowledge Performance Now, point of care tool) and chart review, patient hospitalized 09/20/2019 - 09/23/2019 for  Vagal reaction, orthostatic hypotension, PAD, nonhealing wound of right great, status post Ultrasound-guided access of left common femoral artery, Aortogram including catheter selection of aorta, Bilateral lower extremity arteriogram including selection of third order branches in the right lower extremity, Right posterior tibial artery mechanical orbital atherectomy, Right posterior tibial artery angioplasty, Mynx closure of the left common femoral artery.     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      Assessment: Received Medicare EMMI General Discharge Red Flag Alert follow up referral on 09/29/2019.  Red Flag Alert Triggers, times 4, Da # 4, patient answered no to the following questions: Know who to call about changes in condition?  Wounds healing well?    Patient answered yes to the following questions: Lost interest in things?  Sad/hopeless/anxious/empty?      EMMI follow up completed and will follow up to assess further care management needs.     Plan: RNCM will call patient's daughter / designated party release  for telephone outreach attempt, within 14 business days, EMMI follow up, to assess for further CM needs, and proceed with case closure, within 10 business days if no return call, after 4th unsuccessful outreach call.      Nelda Luckey H. Annia Friendly, BSN, [facility] Telephonic CM Phone: [phone] Fax: [phone]

## 2019-10-06 NOTE — Progress Notes (Signed)
This service is provided via telemedicine  No vital signs collected/recorded due to the encounter was a telemedicine visit.   Location of patient (ex: home, work):  Home  Patient consents to a telephone visit:  Yes, see telephone encounter dated 10/06/2019 with annual consent   Location of the provider (ex: office, home):  [facility], Office   Name of any referring provider:  N/a  Names of all persons participating in the telemedicine service and their role in the encounter:  S.Chrae B/CMA, Sherrie Mustache, NP, [name] (daughter), Quita Skye (son) and Patient   Time spent on call:  8 min with medical assistant       Careteam: Patient Care Team: [name], NP as PCP - General (Geriatric Medicine) Harl Bowie, Alphonse Guild, MD as PCP - Cardiology (Cardiology) [name], MD as Consulting Physician (Urology) [name], MD (Inactive) as Consulting Physician (Hematology and Oncology) [name], MD as Consulting Physician (Orthopedic Surgery) [name], DPM as Consulting Physician (Podiatry) [name], MD as Consulting Physician (Hematology) [name], MD (Dermatology) [name], MD as Consulting Physician (Gastroenterology) [name], MD as Consulting Physician (Vascular Surgery) Sater, [name], MD (Neurology) [facility], [place] [name] (Ophthalmology) Serena Colonel, RN as [facility]  PLACE OF SERVICE:  [facility]  Advanced Directive information    Allergies  Allergen Reactions  . Tape Other (See Comments)    SKIN IS VERY THIN AND WILL TEAR AND BRUISE VERY EASILY!!!!  . Augmentin [Amoxicillin-Pot Clavulanate] Rash and Other (See Comments)    Redness of skin    Chief Complaint  Patient presents with  . [facility] admission follow-up 6/2-6/5 for peripheral artery disease   . Medication Management     Discuss lasix and potassium supplement   . Medication Management    Discuss Nitro Rx      HPI: Patient is a 86 y.o. male for transition of care follow up.  Pt was hospitalized due to slow healing would to toe, noted to have impaired blood flow. Now heal is breaking down. Had a lot of pain when he got home but pain is minimal now.  He recently underwent right posterior tibial intervention last week for critical limb ischemia with tissue loss (great toe ulcer).  Postop course was complicated by syncopal event while he was having a bowel movement in recovery and he was admitted for observation.  He then had ongoing issues with orthostasis and this was felt to be possibly related to adrenal insufficiency and was treated with steroids  Vascular discussed with family and pt that there was nothing else they could do at this time and if it did not heal then the next option would be amputation.  Vascular team also reported poor outcomes after that. Continues with close follow up with vascular.  Daughter states there is some anxiety associated with this. No anxiety attacks or panic at this time but daughter is concerned about this in the future. Yesterday he did get outside which was good for him.  Came home with swelling to left leg and discoloration. He was taken off lasix but had to restart due to swelling. Potassium was stopped due level being too high. Reports he leaking fluids through the cracks.  He is now taking lasix daily, cardiology recommending him to restart potassium 20 meq daily with lasix.   He was taken off of omeprazole. No worsening GERD.  Has not notice any changes since titration off.   Daughter was told that the wound care/nursing care would sign off due to lack of service. He will start following with palliative care soon, has signed up.   Dm- controlled, no hypoglycemia, blood sugar 105 on check today. Continues on glipizide and metformin   Blood pressure well controlled- nursing  getting when they visit, unsure of what it is today. No further episodes of hypotension or syncopal episode.    Review of Systems:  Review of Systems  Constitutional: Negative for chills, fever and weight loss.  HENT: Negative for tinnitus.   Respiratory: Negative for cough, sputum production and shortness of breath.   Cardiovascular: Negative for chest pain, palpitations and leg swelling.  Gastrointestinal: Negative for abdominal pain, constipation, diarrhea and heartburn.  Genitourinary: Negative for dysuria, frequency and urgency.  Musculoskeletal: Negative for back pain, joint pain and myalgias.  Skin: Negative.        Wound to toe and heel  Neurological: Negative for dizziness, speech change and headaches.  Psychiatric/Behavioral: Positive for memory loss. Negative for depression. The patient is nervous/anxious. The patient does not have insomnia.     Past Medical History:  Diagnosis Date  . Acute bronchitis   . Acute lower UTI 11/25/2017  . Amputation of toe of left foot ([place]) 07/30/2017, 08/27/2017    Great toe 07/30/2017 Second toe 08/27/2017   . Arthritis   . Aspiration pneumonia ([place]) 01/31/2018  . Atrial fibrillation ([place])    Dr. Harl Bowie- [facility] follows saw 11'14  . Bladder stones    tx. with oral meds and antibiotics, now surgery planned  . Bone metastases ([place]) 08/21/2015  . Cataracts, both eyes    surgery planned May 2015  . Community acquired pneumonia 04/16/2018  . Cystoid macular edema 04/21/2015    Right eye 04/21/2015   . Dehydration 04/16/2018  . Dementia without behavioral disturbance ([place]) 04/16/2018  . Diabetes mellitus without complication ([place])    Type II  . Diarrhea 11/25/2017  . Dyspnea    with activity  . Dysrhythmia    Afib  . Family history of breast cancer   . Family history of colon cancer   . Genetic testing 02/09/2017  . GERD (gastroesophageal reflux disease)   . 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 Margot Chimes, thus far without recurrence.   Marland Kitchen History of MRI of lumbar spine 03/05/2014  . Hypertension   . Hypertrophy of prostate with urinary obstruction   . Hypomagnesemia 03/16/2017  . Metabolic encephalopathy 60/63/0160  . Metastatic adenocarcinoma to prostate ([place]) 09/08/2013  . 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 NHL (non-Hodgkin's lymphoma) ([place]) 2005   Diffuse large B-cell lymphoma, clinically stage IIIA, CD20 positive, status post cervical lymph node biopsy 08/03/2003 on the left. PET scan was also positive in the spleen and small bowel region, but bone marrow aspiration and biopsy were negative. So he essentially had stage IIIAs. He received R-CHOP x6 cycles with CR established by PET scan criteria on 11/23/2003 with no evidence for relapse th  . Numbness 02/19/2017   Per [facility] new patient packet  . Osteomyelitis ([place]) 06/29/2017   toe of left foot   . PAD (peripheral artery disease) ([place]) 08/27/2017  . Polyneuropathy 02/19/2017  . Postural  dizziness with presyncope 02/19/2017  . S/P TURP 08/10/2013  . Sepsis ([place]) 07/02/2018  . Skin cancer    Basal cell- face,head, neck,  arms, legs, Back  . Urinary frequency 02/19/2017   Past Surgical History:  Procedure Laterality Date  . ABDOMINAL AORTOGRAM W/LOWER EXTREMITY N/A 09/20/2019   Procedure: ABDOMINAL AORTOGRAM W/LOWER EXTREMITY;  Surgeon: Marty Heck, MD;  Location: [facility];  Service: Cardiovascular;  Laterality: N/A;  . AMPUTATION Left 06/29/2017   Procedure: AMPUTATION LEFT GREAT TOE;  Surgeon: [name], MD;  Location: [facility];  Service: Vascular;  Laterality: Left;  . AMPUTATION Left 08/27/2017   Procedure: AMPUTATION Left SECOND TOE;  Surgeon: [name], MD;  Location:  [facility];  Service: Vascular;  Laterality: Left;  . CATARACT EXTRACTION Bilateral 2015  . CHOLECYSTECTOMY    . COLON RESECTION     small bowel  . CYSTOSCOPY WITH LITHOLAPAXY N/A 08/10/2013   Procedure: CYSTOSCOPY WITH LITHOLAPAXY WITH [name];  Surgeon: [name], MD;  [facility]: [facility];  Service: Urology;  Laterality: N/A;  . ESOPHAGEAL DILATION N/A 02/27/2016   Procedure: ESOPHAGEAL DILATION;  Surgeon: [name], MD;  [facility];  Service: Endoscopy;  Laterality: N/A;  . ESOPHAGEAL DILATION N/A 07/07/2018   Procedure: ESOPHAGEAL DILATION;  Surgeon: [name], MD;  [facility];  Service: Endoscopy;  Laterality: N/A;  . ESOPHAGOGASTRODUODENOSCOPY N/A 02/27/2016   Procedure: ESOPHAGOGASTRODUODENOSCOPY (EGD);  Surgeon: [name], MD;  [facility];  Service: Endoscopy;  Laterality: N/A;  12:00  . ESOPHAGOGASTRODUODENOSCOPY N/A 07/07/2018   Procedure: ESOPHAGOGASTRODUODENOSCOPY (EGD);  Surgeon: [name], MD;  [facility];  Service: Endoscopy;  Laterality: N/A;  . INCISION AND DRAINAGE PERIRECTAL ABSCESS    . LOWER EXTREMITY ANGIOGRAPHY N/A 06/25/2017   Procedure: LOWER EXTREMITY ANGIOGRAPHY;  Surgeon: [name], MD;  Location: [facility];  Service: Cardiovascular;  Laterality: N/A;  . LYMPH NODE DISSECTION Left    '05-neck  . PERIPHERAL VASCULAR ATHERECTOMY Right 09/20/2019   Procedure: PERIPHERAL VASCULAR ATHERECTOMY;  Surgeon: Marty Heck, MD;  Location: [facility];  Service: Cardiovascular;  Laterality: Right;  Posterior tibial  . PORT-A-CATH REMOVAL    . PORTACATH PLACEMENT     insertion and removal -last chemotherapy 10 yrs ago  . TRANSURETHRAL RESECTION OF PROSTATE N/A 08/10/2013   Procedure: TRANSURETHRAL RESECTION OF THE PROSTATE WITH GYRUS INSTRUMENTS;  Surgeon: [name], MD;  [facility];  Service: Urology;  Laterality: N/A;   Social History:   reports that he  has never smoked. He has never used smokeless tobacco. He reports that he does not drink alcohol and does not use drugs.  Family History  Problem Relation Age of Onset  . Heart attack Mother        died in her 97s  . Other Father 19       pneumonia - died  . Breast cancer Sister        dx in her 74s-60s  . Colon cancer Brother        dx in his 61s  . Cancer Sister        TAH/BSO due to "male cancer"  . Breast cancer Daughter 73  . Breast cancer Daughter 7       DCIS - ATM VUS on [employer] 83 gene panel in 2018  . Breast cancer Daughter 54       DCIS - Negative on  [employer] MyRisk 25 gene apnel in 2015  . Thyroid cancer Daughter 55       Medullary and Papillary  . Thyroid nodules Grandchild     Medications: Patient's Medications  New Prescriptions   No medications on file  Previous Medications   ABIRATERONE ACETATE (ZYTIGA) 250 MG TABLET    Take 250 mg by mouth daily before breakfast. Take on an empty stomach 1 hour before or 2 hours after a meal    ACETAMINOPHEN (TYLENOL) 500 MG TABLET    Take 1 tablet (500 mg total) by mouth 3 (three) times daily.   ALBUTEROL (PROVENTIL) (2.5 MG/3ML) 0.083% NEBULIZER SOLUTION    Take 2.5 mg by nebulization every 6 (six) hours as needed for wheezing or shortness of breath.   ALBUTEROL (VENTOLIN HFA) 108 (90 BASE) MCG/ACT INHALER    Inhale into the lungs every 6 (six) hours as needed for wheezing or shortness of breath.   APIXABAN (ELIQUIS) 5 MG TABS TABLET    Take 5 mg by mouth 2 (two) times daily.   BLOOD GLUCOSE METER KIT AND SUPPLIES    Dispense based on patient and insurance preference. Use four times daily as directed. (FOR ICD-10 E10.9, E11.9).   CALCIUM POLYCARBOPHIL (FIBER-CAPS PO)    Take 2-3 capsules by mouth See admin instructions. Take 3 capsules by mouth in the morning & take 2 capsules by mouth in the evening.   CEPHALEXIN (KEFLEX) 250 MG CAPSULE    Take 1 capsule (250 mg total) by mouth 2 (two) times daily.   CHOLECALCIFEROL (VITAMIN  D3) 1000 UNITS CAPS    Take 1,000 Units by mouth daily.    CLOPIDOGREL (PLAVIX) 75 MG TABLET    Take 1 tablet (75 mg total) by mouth daily.   CLOTRIMAZOLE-BETAMETHASONE (LOTRISONE) CREAM    Apply 1 application topically 2 (two) times daily as needed (skin irriation.).    CRANBERRY 500 MG TABS    Take 500 mg by mouth daily.    DENOSUMAB (XGEVA) 120 MG/1.7ML SOLN INJECTION    Inject 120 mg into the skin every 30 (thirty) days.   DICLOFENAC SODIUM (VOLTAREN) 1 % GEL    Apply 2 g topically 4 (four) times daily.   DILTIAZEM (CARDIZEM) 60 MG TABLET    Take 60 mg by mouth 2 (two) times daily.   DONEPEZIL (ARICEPT) 10 MG TABLET    Take 1 tablet by mouth once daily   FEEDING SUPPLEMENT, ENSURE ENLIVE, (ENSURE ENLIVE) LIQD    Take 237 mLs by mouth daily. CHOCOLATE    FLUTICASONE (FLONASE) 50 MCG/ACT NASAL SPRAY    Place 2 sprays into both nostrils daily as needed (allergies.).    FUROSEMIDE (LASIX) 20 MG TABLET    Take 20 mg by mouth daily as needed (retention/swelling.).    GLIPIZIDE (GLUCOTROL XL) 5 MG 24 HR TABLET    Take 5 mg by mouth daily with breakfast.    GLUCOSE BLOOD (EASYMAX TEST) TEST STRIP    Use as instructed   LANCETS 28G MISC    1 kit by Does not apply route every morning.   LEUPROLIDE ACETATE, 6 MONTH, (LUPRON DEPOT, 55-MONTH,) 45 MG INJECTION    Inject 45 mg into the muscle every 6 (six) months.   METFORMIN (GLUCOPHAGE) 850 MG TABLET    Take 850 mg by mouth 2 (two) times daily with a meal.    MIRTAZAPINE (REMERON) 15 MG TABLET    Take 1 tablet (15 mg total) by mouth at bedtime.  MULTIPLE VITAMIN (MULTIVITAMIN WITH MINERALS) TABS TABLET    Take 1 tablet by mouth daily.    NITROGLYCERIN (NITRODUR - DOSED IN MG/24 HR) 0.2 MG/HR PATCH    Place 1 patch (0.2 mg total) onto the skin daily.   PREDNISONE (DELTASONE) 2.5 MG TABLET    Take 2.5 mg by mouth 2 (two) times daily with a meal.   PROBIOTIC PRODUCT (PROBIOTIC DAILY PO)    Take 1 tablet by mouth daily. Every morning   TRAMADOL (ULTRAM) 50  MG TABLET    Take 1 tablet (50 mg total) by mouth every 6 (six) hours as needed for moderate pain.   TRIMETHOPRIM (TRIMPEX) 100 MG TABLET    Take 1 tablet (100 mg total) by mouth at bedtime.  Modified Medications   No medications on file  Discontinued Medications   PREDNISONE (DELTASONE) 5 MG TABLET    Take 5 mg tablet by mouth twice a day for 3 days then resume home dose of 2.5 mg twice a day.   SENNA (SENOKOT) 8.6 MG TABS TABLET    Take 1 tablet (8.6 mg total) by mouth 2 (two) times daily.   SIMETHICONE 125 MG CAPS    Take 125 mg by mouth at bedtime.     Physical Exam:  There were no vitals filed for this visit. There is no height or weight on file to calculate BMI. Wt Readings from Last 3 Encounters:  09/26/19 140 lb (63.5 kg)  09/20/19 141 lb (64 kg)  09/15/19 140 lb (63.5 kg)      Labs reviewed: Basic Metabolic Panel: Recent Labs    09/06/19 1117 09/06/19 1117 09/20/19 0928 09/21/19 0240 09/22/19 0327 09/23/19 0313  NA 137   < > 141 138 140  --   K 4.1   < > 4.4 3.6 4.9  --   CL 105   < > 105 110 111  --   CO2 23  --   --  21* 21*  --   GLUCOSE 113*   < > 104* 219* 182*  --   BUN 16   < > _0 --   CREATININE 0.84   < > 0.70 0.99 1.21  --   CALCIUM 10.8*  --   --  9.0 9.1  --   MG  --   --   --   --  1.0* 1.9   < > = values in this interval not displayed.   Liver Function Tests: Recent Labs    08/24/19 1003 09/06/19 1117 09/22/19 0327  AST 13* 12* 16  ALT _1 ALKPHOS 63 57 38  BILITOT 0.7 0.5 0.8  PROT 6.6 6.0* 4.9*  ALBUMIN 3.7 3.5 2.5*   Recent Labs    01/06/19 1229  LIPASE 22   No results for input(s): AMMONIA in the last 8760 hours. CBC: Recent Labs    08/09/19 1100 08/09/19 1100 08/24/19 1003 08/24/19 1003 09/06/19 1117 09/20/19 0928 09/21/19 0240 09/21/19 0802 09/22/19 0327  WBC 8.6   < > 8.5   < > 8.2  --  9.8 10.8* 12.5*  NEUTROABS 4.3  --  4.3  --  4.3  --   --   --   --   HGB 11.5*   < > 11.7*   < > 10.9*   < > 9.3*  9.4* 9.0*  HCT 33.6*   < > 34.3*   < > 32.7*   < > 27.4* 27.9* 27.1*  MCV 95.7   < > 95.5   < > 97.0  --  97.2 95.9 98.9  PLT 175   < > 205   < > 156  --  171 172 164   < > = values in this interval not displayed.   Lipid Panel: No results for input(s): CHOL, HDL, LDLCALC, TRIG, CHOLHDL, LDLDIRECT in the last 8760 hours. TSH: No results for input(s): TSH in the last 8760 hours. A1C: Lab Results  Component Value Date   HGBA1C 5.1 09/06/2019     Assessment/Plan 1. Type 2 diabetes mellitus with other specified complication, without long-term current use of insulin (HCC) Will attempt to stop glizide due to risk of hypoglycemia- last A1c 5.1 which is excellent. Also continues on metformin 850 mg BID with meals. No hypoglycemia at this time.  To continue monitoring blood sugar and notify   2. Paroxysmal atrial fibrillation (HCC) Stable, rate controlled, continues on eliquis for anticoagulation. Continues on diltiazem twice daily for rate control.   3. Essential hypertension Stable on Cardizem; no further issues with hypotension.  4. PAD (peripheral artery disease) (HCC) Strict follow up with vascular, off loading left foot, monitoring for worsening breakdown, redness or drainage. He currently is not having any pain at this time.   5. Moderate protein-calorie malnutrition ([place]) Encouraged proper protein intake to help promote wound healing. Continues on remeron 15 mg qhs as well.   6. Anxiety Pt has been prescribed remeron and now taking tramadol PRN- would not want to add another medication for risk of serotonin syndrome. No panic or anxiety attacks at this time. Discussed to continue things like positive talk, going outside to help with anxiety. To notify if anxiety worsens.   Next appt: 11/24/19 as scheduled.  Carlos American. Harle Battiest  [facility] [phone]   Virtual Visit via Video Note  I connected with [name] on 10/06/19 at 11:30 AM EDT by  a video enabled telemedicine application and verified that I am speaking with the correct person using two identifiers.  Location: Patient: home Provider: office   I discussed the limitations of evaluation and management by telemedicine and the availability of in person appointments. The patient expressed understanding and agreed to proceed.    I discussed the assessment and treatment plan with the patient. The patient was provided an opportunity to ask questions and all were answered. The patient agreed with the plan and demonstrated an understanding of the instructions.   The patient was advised to call back or seek an in-person evaluation if the symptoms worsen or if the condition fails to improve as anticipated.  I provided 21 minutes of non-face-to-face time during this encounter.  Carlos American. Dewaine Oats, AGNP Avs printed and mailed.

## 2019-10-09 ENCOUNTER — Encounter: Payer: Self-pay | Admitting: Nurse Practitioner

## 2019-10-09 ENCOUNTER — Telehealth: Payer: Self-pay

## 2019-10-09 NOTE — Telephone Encounter (Signed)
Triage call received from Katherine Basset with [facility] requesting wound care orders for pt who has painful, dark black, cracked, open and bleeding area on heel. Informed that pts daughter was advised this morning via MyChart message reply to apply a dry dressing to area and Dr. Carlis Abbott will evaluate foot at his appt tomorrow and recommend any additional wound care. Kim verbalized understanding.

## 2019-10-10 ENCOUNTER — Encounter: Payer: Self-pay | Admitting: Nurse Practitioner

## 2019-10-10 ENCOUNTER — Other Ambulatory Visit: Payer: Self-pay | Admitting: *Deleted

## 2019-10-10 ENCOUNTER — Other Ambulatory Visit: Payer: Self-pay

## 2019-10-10 ENCOUNTER — Ambulatory Visit (INDEPENDENT_AMBULATORY_CARE_PROVIDER_SITE_OTHER): Payer: Medicare Other | Admitting: Vascular Surgery

## 2019-10-10 ENCOUNTER — Encounter: Payer: Self-pay | Admitting: Vascular Surgery

## 2019-10-10 VITALS — BP 151/84 | HR 100 | Temp 97.2°F | Resp 16

## 2019-10-10 DIAGNOSIS — I739 Peripheral vascular disease, unspecified: Secondary | ICD-10-CM

## 2019-10-10 MED ORDER — TRAMADOL HCL 50 MG PO TABS: 50.0000 mg | ORAL_TABLET | Freq: Four times a day (QID) | ORAL | 0 refills | Status: DC | PRN

## 2019-10-10 NOTE — Progress Notes (Signed)
woun

## 2019-10-10 NOTE — Progress Notes (Signed)
Patient name: [name] MRN: [id number] DOB: April 09, 1924 Sex: male  REASON FOR VISIT: Follow-up for right heel wound  HPI: [name] is a 86 y.o. male with history of diabetes, atrial fibrillation, hypertension, hyperlipidemia, PAD that presents for follow-up of right foot tissue loss.  He recently underwent right posterior tibial intervention for critical limb ischemia with tissue loss (great toe ulcer).  Postop course was complicated by syncopal event while he was having a bowel movement in recovery and he was admitted for observation.  He then had ongoing issues with orthostasis and this was felt to be possibly related to adrenal insufficiency but as treated with steroids and appreciate the hospitalist input.   Ultimately has had continued breakdown of the heel.  Last time we saw him we gave him tramadol for increased pain control and he is now sleeping through the night.  Transfers are difficult for the family.  They have purchased a [employer]co shoe to offload the right heel.    Past Medical History:  Diagnosis Date  . Acute bronchitis   . Acute lower UTI 11/25/2017  . Amputation of toe of left foot ([place]) 07/30/2017, 08/27/2017    Great toe 07/30/2017 Second toe 08/27/2017   . Arthritis   . Aspiration pneumonia ([place]) 01/31/2018  . Atrial fibrillation ([place])    Dr. Harl Bowie- [facility] follows saw 11'14  . Bladder stones    tx. with oral meds and antibiotics, now surgery planned  . Bone metastases ([place]) 08/21/2015  . Cataracts, both eyes    surgery planned May 2015  . Community acquired pneumonia 04/16/2018  . Cystoid macular edema 04/21/2015    Right eye 04/21/2015   . Dehydration 04/16/2018  . Dementia without behavioral disturbance ([place]) 04/16/2018  . Diabetes mellitus without complication ([place])    Type II  . Diarrhea 11/25/2017  . Dyspnea    with activity  . Dysrhythmia    Afib  . Family history of breast cancer   . Family history of colon cancer   . Genetic testing  02/09/2017  . GERD (gastroesophageal reflux disease)   . 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 Margot Chimes, thus far without recurrence.   Marland Kitchen History of MRI of lumbar spine 03/05/2014  . Hypertension   . Hypertrophy of prostate with urinary obstruction   . Hypomagnesemia 03/16/2017  . Metabolic encephalopathy 28/78/6767  . Metastatic adenocarcinoma to prostate ([place]) 09/08/2013  . 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 NHL (non-Hodgkin's lymphoma) ([place]) 2005   Diffuse large B-cell lymphoma, clinically stage IIIA, CD20 positive, status post cervical lymph node biopsy 08/03/2003 on the left. PET scan was also positive in the spleen and small bowel region, but bone marrow aspiration and biopsy were negative. So he essentially had stage IIIAs. He received R-CHOP x6 cycles with CR established by PET scan criteria on 11/23/2003 with no evidence for relapse th  . Numbness 02/19/2017   Per [facility] new patient packet  . Osteomyelitis ([place]) 06/29/2017   toe of left foot   . PAD (peripheral artery disease) ([place]) 08/27/2017  . Polyneuropathy 02/19/2017  . Postural dizziness with presyncope 02/19/2017  . S/P TURP 08/10/2013  . Sepsis ([place]) 07/02/2018  . Skin cancer    Basal cell- face,head, neck,  arms, legs, Back  .  Urinary frequency 02/19/2017    Past Surgical History:  Procedure Laterality Date  . ABDOMINAL AORTOGRAM W/LOWER EXTREMITY N/A 09/20/2019   Procedure: ABDOMINAL AORTOGRAM W/LOWER EXTREMITY;  Surgeon: Marty Heck, MD;  Location: [facility];  Service: Cardiovascular;  Laterality: N/A;  . AMPUTATION Left 06/29/2017   Procedure: AMPUTATION LEFT GREAT TOE;  Surgeon: [name], MD;  Location: [facility];  Service: Vascular;   Laterality: Left;  . AMPUTATION Left 08/27/2017   Procedure: AMPUTATION Left SECOND TOE;  Surgeon: [name], MD;  Location: [facility];  Service: Vascular;  Laterality: Left;  . CATARACT EXTRACTION Bilateral 2015  . CHOLECYSTECTOMY    . COLON RESECTION     small bowel  . CYSTOSCOPY WITH LITHOLAPAXY N/A 08/10/2013   Procedure: CYSTOSCOPY WITH LITHOLAPAXY WITH [name];  Surgeon: [name], MD;  Location: [facility];  Service: Urology;  Laterality: N/A;  . ESOPHAGEAL DILATION N/A 02/27/2016   Procedure: ESOPHAGEAL DILATION;  Surgeon: [name], MD;  [facility];  Service: Endoscopy;  Laterality: N/A;  . ESOPHAGEAL DILATION N/A 07/07/2018   Procedure: ESOPHAGEAL DILATION;  Surgeon: [name], MD;  [facility];  Service: Endoscopy;  Laterality: N/A;  . ESOPHAGOGASTRODUODENOSCOPY N/A 02/27/2016   Procedure: ESOPHAGOGASTRODUODENOSCOPY (EGD);  Surgeon: [name], MD;  [facility];  Service: Endoscopy;  Laterality: N/A;  12:00  . ESOPHAGOGASTRODUODENOSCOPY N/A 07/07/2018   Procedure: ESOPHAGOGASTRODUODENOSCOPY (EGD);  Surgeon: [name], MD;  [facility];  Service: Endoscopy;  Laterality: N/A;  . INCISION AND DRAINAGE PERIRECTAL ABSCESS    . LOWER EXTREMITY ANGIOGRAPHY N/A 06/25/2017   Procedure: LOWER EXTREMITY ANGIOGRAPHY;  Surgeon: [name], MD;  Location: [facility];  Service: Cardiovascular;  Laterality: N/A;  . LYMPH NODE DISSECTION Left    '05-neck  . PERIPHERAL VASCULAR ATHERECTOMY Right 09/20/2019   Procedure: PERIPHERAL VASCULAR ATHERECTOMY;  Surgeon: Marty Heck, MD;  Location: [facility];  Service: Cardiovascular;  Laterality: Right;  Posterior tibial  . PORT-A-CATH REMOVAL    . PORTACATH PLACEMENT     insertion and removal -last chemotherapy 10 yrs ago  . TRANSURETHRAL RESECTION OF PROSTATE N/A 08/10/2013   Procedure: TRANSURETHRAL RESECTION OF THE PROSTATE WITH GYRUS  INSTRUMENTS;  Surgeon: [name], MD;  Location[facility];  Service: Urology;  Laterality: N/A;    Family History  Problem Relation Age of Onset  . Heart attack Mother        died in her 67s  . Other Father 40       pneumonia - died  . Breast cancer Sister        dx in her 27s-60s  . Colon cancer Brother        dx in his 81s  . Cancer Sister        TAH/BSO due to "male cancer"  . Breast cancer Daughter 59  . Breast cancer Daughter 7       DCIS - ATM VUS on [employer] 83 gene panel in 2018  . Breast cancer Daughter 81       DCIS - Negative on [employer] MyRisk 25 gene apnel in 2015  . Thyroid cancer Daughter 68       Medullary and Papillary  . Thyroid nodules Grandchild     SOCIAL HISTORY: Social History   Tobacco Use  . Smoking status: Never Smoker  . Smokeless tobacco: Never Used  Substance Use Topics  . Alcohol use:  No    Allergies  Allergen Reactions  . Tape Other (See Comments)    SKIN IS VERY THIN AND WILL TEAR AND BRUISE VERY EASILY!!!!  . Augmentin [Amoxicillin-Pot Clavulanate] Rash and Other (See Comments)    Redness of skin    Current Outpatient Medications  Medication Sig Dispense Refill  . abiraterone acetate (ZYTIGA) 250 MG tablet Take 250 mg by mouth daily before breakfast. Take on an empty stomach 1 hour before or 2 hours after a meal     . acetaminophen (TYLENOL) 500 MG tablet Take 1 tablet (500 mg total) by mouth 3 (three) times daily. 30 tablet 0  . apixaban (ELIQUIS) 5 MG TABS tablet Take 5 mg by mouth 2 (two) times daily.    . blood glucose meter kit and supplies Dispense based on patient and insurance preference. Use four times daily as directed. (FOR ICD-10 E10.9, E11.9). 1 each 0  . Calcium Polycarbophil (FIBER-CAPS PO) Take 2-3 capsules by mouth See admin instructions. Take 3 capsules by mouth in the morning & take 2 capsules by mouth in the evening.    . cephALEXin (KEFLEX) 250 MG capsule Take 1 capsule (250 mg total) by mouth 2 (two) times  daily. 28 capsule 0  . Cholecalciferol (VITAMIN D3) 1000 units CAPS Take 1,000 Units by mouth daily.     . clopidogrel (PLAVIX) 75 MG tablet Take 1 tablet (75 mg total) by mouth daily. 30 tablet 11  . clotrimazole-betamethasone (LOTRISONE) cream Apply 1 application topically 2 (two) times daily as needed (skin irriation.).     Marland Kitchen Cranberry 500 MG TABS Take 500 mg by mouth daily.     Marland Kitchen denosumab (XGEVA) 120 MG/1.7ML SOLN injection Inject 120 mg into the skin every 30 (thirty) days.    . diclofenac Sodium (VOLTAREN) 1 % GEL Apply 2 g topically 4 (four) times daily. 150 g 0  . diltiazem (CARDIZEM) 60 MG tablet Take 60 mg by mouth 2 (two) times daily.    Marland Kitchen donepezil (ARICEPT) 10 MG tablet Take 1 tablet by mouth once daily 90 tablet 0  . feeding supplement, ENSURE ENLIVE, (ENSURE ENLIVE) LIQD Take 237 mLs by mouth daily. CHOCOLATE     . fluticasone (FLONASE) 50 MCG/ACT nasal spray Place 2 sprays into both nostrils daily as needed (allergies.).     Marland Kitchen furosemide (LASIX) 20 MG tablet Take 20 mg by mouth daily as needed (retention/swelling.).     Marland Kitchen glucose blood (EASYMAX TEST) test strip Use as instructed 100 each 12  . Lancets 28G MISC 1 kit by Does not apply route every morning. 100 each 1  . Leuprolide Acetate, 6 Month, (LUPRON DEPOT, 83-MONTH,) 45 MG injection Inject 45 mg into the muscle every 6 (six) months.    . metFORMIN (GLUCOPHAGE) 850 MG tablet Take 850 mg by mouth 2 (two) times daily with a meal.     . mirtazapine (REMERON) 15 MG tablet Take 1 tablet (15 mg total) by mouth at bedtime. 30 tablet 0  . Multiple Vitamin (MULTIVITAMIN WITH MINERALS) TABS tablet Take 1 tablet by mouth daily.     . nitroGLYCERIN (NITRODUR - DOSED IN MG/24 HR) 0.2 mg/hr patch Place 1 patch (0.2 mg total) onto the skin daily. 30 patch 12  . predniSONE (DELTASONE) 2.5 MG tablet Take 2.5 mg by mouth 2 (two) times daily with a meal.    . Probiotic Product (PROBIOTIC DAILY PO) Take 1 tablet by mouth daily. Every morning      . traMADol (  ULTRAM) 50 MG tablet Take 1 tablet (50 mg total) by mouth every 6 (six) hours as needed for moderate pain. 30 tablet 0  . trimethoprim (TRIMPEX) 100 MG tablet Take 1 tablet (100 mg total) by mouth at bedtime. 90 tablet 3   No current facility-administered medications for this visit.    REVIEW OF SYSTEMS:  _0  denotes positive finding, _1  denotes negative finding Cardiac  Comments:  Chest pain or chest pressure:    Shortness of breath upon exertion:    Short of breath when lying flat:    Irregular heart rhythm:        Vascular    Pain in calf, thigh, or hip brought on by ambulation:    Pain in feet at night that wakes you up from your sleep:     Blood clot in your veins:    Leg swelling:         Pulmonary    Oxygen at home:    Productive cough:     Wheezing:         Neurologic    Sudden weakness in arms or legs:     Sudden numbness in arms or legs:     Sudden onset of difficulty speaking or slurred speech:    Temporary loss of vision in one eye:     Problems with dizziness:         Gastrointestinal    Blood in stool:     Vomited blood:         Genitourinary    Burning when urinating:     Blood in urine:        Psychiatric    Major depression:         Hematologic    Bleeding problems:    Problems with blood clotting too easily:        Skin    Rashes or ulcers:        Constitutional    Fever or chills:      PHYSICAL EXAM: Vitals:   10/10/19 1117  BP: (!) 151/84  Pulse: 100  Resp: 16  Temp: (!) 97.2 F (36.2 C)  TempSrc: Temporal  SpO2: 100%    GENERAL: The patient is a well-nourished male, in no acute distress. The vital signs are documented above. CARDIAC: There is a regular rate and rhythm.  VASCULAR:  Palpable femoral pulses bilaterally Right dorsalis pedis, peroneal brisk by Doppler; right PT monophasic Right heel pictured below Dry ulcer right great toe ABDOMEN: Soft and non-tender with normal pitched bowel sounds.   MUSCULOSKELETAL: There are no major deformities or cyanosis. NEUROLOGIC: No focal weakness or paresthesias are detected.         DATA:   None  Assessment/Plan:  86 year old male who presented with critical limb ischemia of the right lower extremity with a nonhealing wound to the great toe.  Ultimately he had severe tibial disease and dominant runoff was through the anterior tibial artery that was patent down to the ankle but had very poor outflow due to small vessel disease.  Ultimately underwent posterior tibial intervention for severely calcified artery with chronic total occlusion distally and likely had some atheroembolism to the heel.    Had a lengthy discussion with the family today that I do not think he has enough inflow to heal his heel wound.  Now has worsening eschar to the heel with sloughing.  We discussed options including palliative wound care with no anticipation that this is going to heal  versus above-knee amputation.  Although he would probably heal a below-knee amputation given he has a palpable popliteal pulse he likely would be nonambulatory after amputation and I think above knee amputation would would give him the best chance of healing without developing a contracture.  We also briefly discussed referral to hospice/palliative care services given his comorbidities and deconditioning/frailty and the family is having a harder time at home taking care of him.  Ultimately family would like to further think about options.  I have placed him in Xeroform to the right heel and have given instructions to home health.  I will have him follow-up in 1 week while family considers options.  I did refill his tramadol which seems to be helping him sleep through the night.   Marty Heck, MD [facility] Office: [phone]

## 2019-10-10 NOTE — Telephone Encounter (Signed)
Routed to Jessica Eubanks NP  

## 2019-10-11 ENCOUNTER — Other Ambulatory Visit (HOSPITAL_COMMUNITY): Payer: Self-pay | Admitting: Hematology

## 2019-10-12 ENCOUNTER — Telehealth: Payer: Self-pay | Admitting: Cardiology

## 2019-10-12 ENCOUNTER — Other Ambulatory Visit (HOSPITAL_COMMUNITY): Payer: Self-pay | Admitting: *Deleted

## 2019-10-12 MED ORDER — ABIRATERONE ACETATE 250 MG PO TABS: 250.0000 mg | ORAL_TABLET | Freq: Every day | ORAL | 0 refills | Status: DC

## 2019-10-12 NOTE — Telephone Encounter (Signed)
I will FYI Dr.Branch ?

## 2019-10-12 NOTE — Telephone Encounter (Signed)
Per Daughter re: foot complications is leading to amputation and/or Palliative Care    Please call  [phone]   thanks renee

## 2019-10-13 ENCOUNTER — Encounter (HOSPITAL_BASED_OUTPATIENT_CLINIC_OR_DEPARTMENT_OTHER): Payer: Medicare Other | Attending: Internal Medicine | Admitting: Internal Medicine

## 2019-10-13 DIAGNOSIS — L97412 Non-pressure chronic ulcer of right heel and midfoot with fat layer exposed: Secondary | ICD-10-CM | POA: Diagnosis not present

## 2019-10-13 DIAGNOSIS — E1140 Type 2 diabetes mellitus with diabetic neuropathy, unspecified: Secondary | ICD-10-CM | POA: Insufficient documentation

## 2019-10-13 DIAGNOSIS — E11621 Type 2 diabetes mellitus with foot ulcer: Secondary | ICD-10-CM | POA: Insufficient documentation

## 2019-10-13 NOTE — Progress Notes (Signed)
LONNELL, CHAPUT ([id number]) Visit Report for 10/13/2019 HPI Details Patient Name: Date of Service: [name] 10/13/2019 10:30 A M Medical Record Number: [id number] Patient Account Number: [id number] Date of Birth/Sex: Treating RN: 04-11-1924 (86 y.o. [name] Primary Care Provider: Sherrie Mustache Other Clinician: Referring Provider: Treating Provider/Extender: Deloria Lair in Treatment: 11 History of Present Illness HPI Description: ADMISSION 05/02/2019 This is a 86 year old man accompanied by his daughter. He lives in [place] with his elderly wife. He has home caregivers as well. Apparently on February 28, 2019 they noted blood on his sock and noticed a wound on the tip of his right great toe. He has been cared for by Dr. Posey Pronto who is his podiatrist in [place]. Apparently they have been using Betadine to this area. The patient has a thick mycotic nail on the right as well which is embedded in the wound in some areas. Fortunately the patient has diabetic neuropathy and is not really experiencing any pain otherwise I think this would all be quite painful. The patient was previously treated in 2019 for wounds on the left foot. This included a fairly thorough vascular review by vein and vascular Dr. Oneida Alar. His ABIs at that time were noncompressible bilaterally TBI on the right was 0.88 with biphasic waveforms on the right and monophasic waveforms at the left. He ended up having amputations of the left first and second toes. He did have an angiogram on 06/25/2017 which showed on the right the right common femoral profundofemoral for Morris and superficial femoral arteries were widely patent the right popliteal was widely patent. Tibial vessels were not well opacified secondary to motion artifact however he was felt to have all 3 tibial vessels intact with some calcification and some areas of stenosis within the posterior  tibial artery but relatively good flow to the right lower leg. He also had the left reviewed as well which was the source of the problem at that time. Past medical history; prostate cancer on oral antiandrogen therapy with bone mets, GI stromal tumor in 2005, type 2 diabetes with peripheral neuropathy, hypertension, atrial fibrillation on Eliquis, history of non-Hodgkin's lymphoma treated with CHOP in 2005. He had amputations of the left first and second toes secondary to osteomyelitis. Does not appear that he has had an x-ray of the right foot ABI in our clinic on the right was noncompressible [see full arterial discussion above]. 1/19; area on the tip of the right great toe under a very mycotic nail tip. We use silver alginate. 1/26; area of the tip of the right great toe under a very mycotic nail. A lot of this seems to have closed down we have been using silver alginate 2/9; the areas on the tip of the right great toe under a very mycotic nail. A lot of this continues to close down. I do not think there is a subungual problem. I am hopeful that this mycotic nail which is thick and raised will allow full epithelialization of the wound 2/23; this was a difficult area on the tip of his right great toe. Thick mycotic nail over the base of the wound. This appears to have closed down now. The nail itself is thick split with considerable subungual debris. READMISSION 07/25/2019 This is a 86 year old man that we cared for earlier this year in the clinic without a wound on the tip of his right great toe under a very mycotic toenail. He is a type II diabetic.  His ABIs have been noncompressible however he has been felt to have adequate blood flow in the right to heal the wound. We were able to get this wound to close over as I understand things he followed with Dr. Caprice Beaver had some debridement of the nail everything looks fine and apparently the wound bed was washed off by his wife over the weekend and by  Monday it was open. His daughter is able to show me pictures. Unfortunately this now has exposed bone raising the possibility that he has had underlying osteomyelitis all along. He has been using Silvadene cream on this he has been referred back to the clinic by Dr. Caprice Beaver. He had x-rays of the right foot that did not show evidence of osteomyelitis in the right foot. He has not been on antibiotics He also had an x-ray of the left foot that showed findings suspicious for osteomyelitis involving the residual base of the first proximal phalanx and the proximal phalanx of the left second toe as well as the left second metatarsal head however he has no wounds in this area. 4/12; culture from last time of bone in the right great toe was negative. The patient's wife apparently scrubbed this toe vigorously and according to his daughter reopen the wound however unfortunately it is all exposed bone last time. I removed some of this and we use Prisma unfortunately the area does not look much better today. Previous x-rays done in podiatry office Dr. Caprice Beaver did not show evidence of osteomyelitis. Nevertheless I think osteomyelitis here is probably quite likely. Still using Prisma 4/26; he took 2 weeks of doxycycline although his daughter is telling me he is not eating. He has loose bowel movements however I did not get the sense that this is changed all that much. He still has exposed bone. Culture I did last time showed a few coag negative staph I do not think that this is necessarily relevant. I still am operating under the premise that the patient probably has osteomyelitis. After considerable difficulty we are able to determine he is not allergic to cephalosporins I therefore gave him Keflex. 5/10; is tolerating Keflex 500 3 times daily quite well. He still has the small wound in the tip of his toe with exposed bone.Marland Kitchen Unfortunately he does not really have any granulation tissue. He saw Dr. Caprice Beaver of  podiatry in [place] last week I have not had a chance to look at his notes The patient had an extensive vascular work-up including an angiogram in 2019. He was felt to have adequate blood supply on the right at the time although he did have amputations of the left first and second toe by Dr. Oneida Alar. 09/11/19 -Patient is back and is about to finish his Keflex, appointment with Dr. Oneida Alar is in the works. The right great toe wound looks about the same, the left third toe dorsal wound has healed. There was a small eschar covering it that was removed by podiatry.According to patient's daughter the right great toe base on the plantar surface had some redness that seems to have disappeared. 10/13/2019; patient is back in clinic today after 1 month hiatus although I have not seen him since 5/10. Since he was last here he was admitted to hospital from 6/2 through 6/5 because of a nonhealing wound on his right great toe. He underwent an angiogram which showed patent common femoral, profunda, SFA above and below-knee popliteal artery. The dominant runoff in the right lower extremity was through the anterior  tibial but that became very small at the level of the ankle and had minimal outflow into the foot. The peroneal artery was patent but atretic. 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 Interventions were attempted on the posterior tibial. Mechanical orbital atherectomy down to the level of the ankle and then this was angioplastied with 2.3 mm and 3 mm balloons. Flow remained very sluggish down the posterior tibial artery but was patent. Post event there apparently was embolization of an area involving the plantar left heel and sides of the left heel. He arrives back in clinic today it has been almost 7 weeks since I have seen him. Miraculously the great toe area with open bone is  closed over probably as result of Dr. Ainsley Spinner interventions. He does however have an extensive ischemic area on the plantar and medial aspect of his heel. I reviewed Dr. Ainsley Spinner last note from 3 days ago on 6/22. He did not think there was enough inflow to heal the heel wound. With eschar and sloughing. Wondered whether he could heal a below-knee amputation but more likely an above the knee amputation when required. He was placed on Xeroform to the right heel In discussing things with the patient's family and the patient present he is not in a lot of pain. The area does not appear to be grossly infected. The patient appears to be comfortable and conversational. He is eating and drinking fairly well. Electronic Signature(s) Signed: 10/13/2019 5:27:17 PM By: Linton Ham MD Entered By: Linton Ham on 10/13/2019 12:33:02 -------------------------------------------------------------------------------- Physical Exam Details Patient Name: Date of Service: [name] 10/13/2019 10:30 A M Medical Record Number: [id number] Patient Account Number: [id number] Date of Birth/Sex: Treating RN: [birth year] (86 y.o. [name] Primary Care Provider: Sherrie Mustache Other Clinician: Referring Provider: Treating Provider/Extender: Deloria Lair in Treatment: 11 Cardiovascular Pedal pulses are absent. Integumentary (Hair, Skin) I saw no primary skin conditions. Psychiatric appears at normal baseline. Notes Wound exam; miraculously the tip of the right great toe has closed over. There is no open bone. On the plantar and medial part of the right heel he has 2 small open areas that remain clean and an eschared area on the plantar aspect. Black eschar. This is not painful. There is no gross purulent drainage. Not particularly tender Electronic Signature(s) Signed: 10/13/2019 5:27:17 PM By: Linton Ham MD Entered By: Linton Ham on 10/13/2019  12:36:39 -------------------------------------------------------------------------------- Physician Orders Details Patient Name: Date of Service: [name] 10/13/2019 10:30 A M Medical Record Number: [id number] Patient Account Number: [id number] Date of Birth/Sex: Treating RN: 1924-01-19 (86 y.o. [name] Primary Care Provider: Sherrie Mustache Other Clinician: Referring Provider: Treating Provider/Extender: Deloria Lair in Treatment: 11 Verbal / Phone Orders: No Diagnosis Coding ICD-10 Coding Code Description E11.621 Type 2 diabetes mellitus with foot ulcer L97.514 Non-pressure chronic ulcer of other part of right foot with necrosis of bone E11.40 Type 2 diabetes mellitus with diabetic neuropathy, unspecified Follow-up Appointments Return Appointment in 2 weeks. Dressing Change Frequency Wound #2 Right T Great oe Change dressing every day. Change dressing three times week. - home health to change 3x/week Wound #3 Right Calcaneus Change dressing every day. Change dressing three times week. - home health to change 3x/week Wound Cleansing Wound #2 Right T Great oe May shower and wash wound with  soap and water. - on days that dressing is changed Wound #3 Right Calcaneus May shower and wash wound with soap and water. - on days that dressing is changed Primary Wound Dressing Wound #2 Right T Great oe Other: - betadine Wound #3 Right Calcaneus lginate with Silver - to open sites Calcium A Other: - betadine to eschar sites Secondary Dressing Wound #3 Right Calcaneus Kerlix/Rolled Gauze Dry Gauze Heel [facility] skilled nursing for wound care. - [facility] Electronic Signature(s) Signed: 10/13/2019 5:01:22 PM By: [name] Signed: 10/13/2019 5:27:17 PM By: Linton Ham MD Entered By: [name] on 10/13/2019  11:46:38 -------------------------------------------------------------------------------- Problem List Details Patient Name: Date of Service: [name] 10/13/2019 10:30 A M Medical Record Number: [id number] Patient Account Number: [id number] Date of Birth/Sex: Treating RN: 13-Nov-1923 (86 y.o. [name] Primary Care Provider: Sherrie Mustache Other Clinician: Referring Provider: Treating Provider/Extender: Deloria Lair in Treatment: 11 Active Problems ICD-10 Encounter Code Description Active Date MDM Diagnosis E11.621 Type 2 diabetes mellitus with foot ulcer 07/25/2019 No Yes L97.514 Non-pressure chronic ulcer of other part of right foot with necrosis of bone 07/25/2019 No Yes E11.40 Type 2 diabetes mellitus with diabetic neuropathy, unspecified 07/25/2019 No Yes Inactive Problems Resolved Problems Electronic Signature(s) Signed: 10/13/2019 5:27:17 PM By: Linton Ham MD Entered By: Linton Ham on 10/13/2019 12:27:11 -------------------------------------------------------------------------------- Progress Note Details Patient Name: Date of Service: [name] 10/13/2019 10:30 A M Medical Record Number: [id number] Patient Account Number: [id number] Date of Birth/Sex: Treating RN: 22-Nov-1923 (86 y.o. [name] Primary Care Provider: Sherrie Mustache Other Clinician: Referring Provider: Treating Provider/Extender: Deloria Lair in Treatment: 11 Subjective History of Present Illness (HPI) ADMISSION 05/02/2019 This is a 86 year old man accompanied by his daughter. He lives in [place] with his elderly wife. He has home caregivers as well. Apparently on February 28, 2019 they noted blood on his sock and noticed a wound on the tip of his right great toe. He has been cared for by Dr. Posey Pronto who is his podiatrist in [place]. Apparently they have been using Betadine to this area.  The patient has a thick mycotic nail on the right as well which is embedded in the wound in some areas. Fortunately the patient has diabetic neuropathy and is not really experiencing any pain otherwise I think this would all be quite painful. The patient was previously treated in 2019 for wounds on the left foot. This included a fairly thorough vascular review by vein and vascular Dr. Oneida Alar. His ABIs at that time were noncompressible bilaterally TBI on the right was 0.88 with biphasic waveforms on the right and monophasic waveforms at the left. He ended up having amputations of the left first and second toes. He did have an angiogram on 06/25/2017 which showed on the right the right common femoral profundofemoral for Morris and superficial femoral arteries were widely patent the right popliteal was widely patent. Tibial vessels were not well opacified secondary to motion artifact however he was felt to have all 3 tibial vessels intact with some calcification and some areas of stenosis within the posterior tibial artery but relatively good flow to the right lower leg. He also had the left reviewed as well which was the source of the problem at that time. Past medical history; prostate cancer on oral antiandrogen therapy with bone mets, GI stromal tumor in 2005, type 2 diabetes with peripheral neuropathy, hypertension, atrial fibrillation on  Eliquis, history of non-Hodgkin's lymphoma treated with CHOP in 2005. He had amputations of the left first and second toes secondary to osteomyelitis. Does not appear that he has had an x-ray of the right foot ABI in our clinic on the right was noncompressible [see full arterial discussion above]. 1/19; area on the tip of the right great toe under a very mycotic nail tip. We use silver alginate. 1/26; area of the tip of the right great toe under a very mycotic nail. A lot of this seems to have closed down we have been using silver alginate 2/9; the areas on the tip  of the right great toe under a very mycotic nail. A lot of this continues to close down. I do not think there is a subungual problem. I am hopeful that this mycotic nail which is thick and raised will allow full epithelialization of the wound 2/23; this was a difficult area on the tip of his right great toe. Thick mycotic nail over the base of the wound. This appears to have closed down now. The nail itself is thick split with considerable subungual debris. READMISSION 07/25/2019 This is a 86 year old man that we cared for earlier this year in the clinic without a wound on the tip of his right great toe under a very mycotic toenail. He is a type II diabetic. His ABIs have been noncompressible however he has been felt to have adequate blood flow in the right to heal the wound. We were able to get this wound to close over as I understand things he followed with Dr. Caprice Beaver had some debridement of the nail everything looks fine and apparently the wound bed was washed off by his wife over the weekend and by Monday it was open. His daughter is able to show me pictures. Unfortunately this now has exposed bone raising the possibility that he has had underlying osteomyelitis all along. He has been using Silvadene cream on this he has been referred back to the clinic by Dr. Caprice Beaver. He had x-rays of the right foot that did not show evidence of osteomyelitis in the right foot. He has not been on antibiotics He also had an x-ray of the left foot that showed findings suspicious for osteomyelitis involving the residual base of the first proximal phalanx and the proximal phalanx of the left second toe as well as the left second metatarsal head however he has no wounds in this area. 4/12; culture from last time of bone in the right great toe was negative. The patient's wife apparently scrubbed this toe vigorously and according to his daughter reopen the wound however unfortunately it is all exposed bone last time. I  removed some of this and we use Prisma unfortunately the area does not look much better today. Previous x-rays done in podiatry office Dr. Caprice Beaver did not show evidence of osteomyelitis. Nevertheless I think osteomyelitis here is probably quite likely. Still using Prisma 4/26; he took 2 weeks of doxycycline although his daughter is telling me he is not eating. He has loose bowel movements however I did not get the sense that this is changed all that much. He still has exposed bone. Culture I did last time showed a few coag negative staph I do not think that this is necessarily relevant. I still am operating under the premise that the patient probably has osteomyelitis. After considerable difficulty we are able to determine he is not allergic to cephalosporins I therefore gave him Keflex. 5/10; is tolerating Keflex  500 3 times daily quite well. He still has the small wound in the tip of his toe with exposed bone.Marland Kitchen Unfortunately he does not really have any granulation tissue. He saw Dr. Caprice Beaver of podiatry in [place] last week I have not had a chance to look at his notes The patient had an extensive vascular work-up including an angiogram in 2019. He was felt to have adequate blood supply on the right at the time although he did have amputations of the left first and second toe by Dr. Oneida Alar. 09/11/19 -Patient is back and is about to finish his Keflex, appointment with Dr. Oneida Alar is in the works. The right great toe wound looks about the same, the left third toe dorsal wound has healed. There was a small eschar covering it that was removed by podiatry.According to patient's daughter the right great toe base on the plantar surface had some redness that seems to have disappeared. 10/13/2019; patient is back in clinic today after 1 month hiatus although I have not seen him since 5/10. Since he was last here he was admitted to hospital from 6/2 through 6/5 because of a nonhealing wound on his right great  toe. He underwent an angiogram which showed patent common femoral, profunda, SFA above and below-knee popliteal artery. The dominant runoff in the right lower extremity was through the anterior tibial but that became very small at the level of the ankle and had minimal outflow into the foot. The peroneal artery was patent but atretic. 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 Interventions were attempted on the posterior tibial. Mechanical orbital atherectomy down to the level of the ankle and then this was angioplastied with 2.3 mm and 3 mm balloons. Flow remained very sluggish down the posterior tibial artery but was patent. Post event there apparently was embolization of an area involving the plantar left heel and sides of the left heel. He arrives back in clinic today it has been almost 7 weeks since I have seen him. Miraculously the great toe area with open bone is closed over probably as result of Dr. Ainsley Spinner interventions. He does however have an extensive ischemic area on the plantar and medial aspect of his heel. I reviewed Dr. Ainsley Spinner last note from 3 days ago on 6/22. He did not think there was enough inflow to heal the heel wound. With eschar and sloughing. Wondered whether he could heal a below-knee amputation but more likely an above the knee amputation when required. He was placed on Xeroform to the right heel In discussing things with the patient's family and the patient present he is not in a lot of pain. The area does not appear to be grossly infected. The patient appears to be comfortable and conversational. He is eating and drinking fairly well. Objective Constitutional Vitals Time Taken: 11:03 AM, Height: 70 in, Source: Stated, Weight: 140 lbs, Source: Stated, BMI: 20.1, Temperature: 98.1 F, Pulse: 106 bpm, Respiratory Rate: 18 breaths/min, Blood  Pressure: 129/82 mmHg, Capillary Blood Glucose: 114 mg/dl. General Notes: glucose per pt report this am Cardiovascular Pedal pulses are absent. Psychiatric appears at normal baseline. General Notes: Wound exam; miraculously the tip of the right great toe has closed over. There is no open bone. ooOn the plantar and medial part of the right heel he has 2 small open areas that remain clean and an eschared area on the  plantar aspect. Black eschar. This is not painful. There is no gross purulent drainage. Not particularly tender Integumentary (Hair, Skin) I saw no primary skin conditions. Wound #2 status is Open. Original cause of wound was Gradually Appeared. The wound is located on the Right T Great. The wound measures 0.5cm length x oe 0.7cm width x 0.1cm depth; 0.275cm^2 area and 0.027cm^3 volume. There is Fat Layer (Subcutaneous Tissue) Exposed exposed. There is no tunneling or undermining noted. There is a none present amount of drainage noted. The wound margin is distinct with the outline attached to the wound base. There is no granulation within the wound bed. There is a large (67-100%) amount of necrotic tissue within the wound bed including Eschar. Wound #3 status is Open. Original cause of wound was Gradually Appeared. The wound is located on the Right Calcaneus. The wound measures 7cm length x 7.8cm width x 0.1cm depth; 42.883cm^2 area and 4.288cm^3 volume. There is Fat Layer (Subcutaneous Tissue) Exposed exposed. There is no tunneling or undermining noted. There is a medium amount of serous drainage noted. The wound margin is flat and intact. There is small (1-33%) pink granulation within the wound bed. There is a large (67-100%) amount of necrotic tissue within the wound bed including Eschar. Assessment Active Problems ICD-10 Type 2 diabetes mellitus with foot ulcer Non-pressure chronic ulcer of other part of right foot with necrosis of bone Type 2 diabetes mellitus with diabetic  neuropathy, unspecified Plan Follow-up Appointments: Return Appointment in 2 weeks. Dressing Change Frequency: Wound #2 Right T Great: oe Change dressing every day. Change dressing three times week. - home health to change 3x/week Wound #3 Right Calcaneus: Change dressing every day. Change dressing three times week. - home health to change 3x/week Wound Cleansing: Wound #2 Right T Great: oe May shower and wash wound with soap and water. - on days that dressing is changed Wound #3 Right Calcaneus: May shower and wash wound with soap and water. - on days that dressing is changed Primary Wound Dressing: Wound #2 Right T Great: oe Other: - betadine Wound #3 Right Calcaneus: Calcium Alginate with Silver - to open sites Other: - betadine to eschar sites Secondary Dressing: Wound #3 Right Calcaneus: Kerlix/Rolled Gauze Dry Gauze Heel Cup Home Health: [facility] skilled nursing for wound care. - [facility]. I am frankly amazed that the right great toe is healed and warm. I think Dr. Ainsley Spinner interventions have really prove they are worth here. 2. Likely an embolic area to the heel at the time of his intervention to the posterior tibial artery. There is a large area of eschar here however it is loose not particularly tender. He has areas on the medial foot that appear to have healthy granulation. We will use Betadine on this and silver alginate. 3. I see no evidence at this time that the patient requires an amputation although I encouraged the patient and his daughter to reach a consensus of how they would like to approach this when the time came that he would require it. This would include a grossly infected or increasingly necrotic wound and/or severe unrelenting pain that may daily living difficult. At that point he would need to make a decision between above-knee amputation which would certainly heal and a hospice based pain control. I asked him to talk about this as it  would seem quite possible that he will eventually end up in this route if he does not have some other condition that takes him first.  4. I am not sure that he does not have enough blood flow to heal the heel although he does have small vessel disease. The tissue around there looked healthy and warm. We will see how this goes. I did not debride the area of dead skin although I will consider this later 5. In passing his daughter mentioned that there pressure areas on his buttock but we did not look at those today. We will try to do that again next time he is here which will be in 2 weeks I spent 35 minutes in review of this patient's records including recent admission, face-to-face evaluation and preparation of this record Electronic Signature(s) Signed: 10/13/2019 5:27:17 PM By: Linton Ham MD Entered By: Linton Ham on 10/13/2019 12:58:38 -------------------------------------------------------------------------------- SuperBill Details Patient Name: Date of Service: [name] 10/13/2019 Medical Record Number: [id number] Patient Account Number: [id number] Date of Birth/Sex: Treating RN: 1924/03/19 (86 y.o. [name] Primary Care Provider: Sherrie Mustache Other Clinician: Referring Provider: Treating Provider/Extender: Deloria Lair in Treatment: 11 Diagnosis Coding ICD-10 Codes Code Description E11.621 Type 2 diabetes mellitus with foot ulcer L97.514 Non-pressure chronic ulcer of other part of right foot with necrosis of bone E11.40 Type 2 diabetes mellitus with diabetic neuropathy, unspecified E11.52 Type 2 diabetes mellitus with diabetic peripheral angiopathy with gangrene Facility Procedures CPT4 Code: [id number] Description: 12820 - WOUND CARE VISIT-LEV 4 EST PT Modifier: Quantity: 1 Physician Procedures Electronic Signature(s) Signed: 10/13/2019 5:27:17 PM By: Linton Ham MD Entered By: Linton Ham on 10/13/2019  12:40:32

## 2019-10-13 NOTE — Progress Notes (Signed)
[name], [name] ([id number]) Visit Report for 10/13/2019 Arrival Information Details Patient Name: Date of Service: [name] 10/13/2019 10:30 A M Medical Record Number: [id number] Patient Account Number: [id number] Date of Birth/Sex: Treating RN: January 06, 1924 (86 y.o. [name], [name] Primary Care [name]: Sherrie Mustache Other Clinician: Referring Derreck Wiltsey: Treating [name]/Extender: [name] in Treatment: 11 Visit Information History Since Last Visit All ordered tests and consults were completed: Yes Patient Arrived: Wheel Chair Added or deleted any medications: Yes Arrival Time: 11:00 Any new allergies or adverse reactions: No Accompanied By: Charolett Bumpers Had a fall or experienced change in No Transfer Assistance: None activities of daily living that may affect Patient Identification Verified: Yes risk of falls: Secondary Verification Process Completed: Yes Signs or symptoms of abuse/neglect since last visito No Patient Requires Transmission-Based Precautions: No Hospitalized since last visit: No Patient Has Alerts: Yes Implantable device outside of the clinic excluding No Patient Alerts: R ABI non compressible cellular tissue based products placed in the center since last visit: Pain Present Now: No Electronic Signature(s) Signed: 10/13/2019 4:55:10 PM By: Baruch Gouty RN, BSN Entered By: Baruch Gouty on 10/13/2019 11:03:35 -------------------------------------------------------------------------------- Clinic Level of Care Assessment Details Patient Name: Date of Service: [name] E. 10/13/2019 10:30 A M Medical Record Number: [id number] Patient Account Number: [id number] Date of Birth/Sex: Treating RN: [birth year] (86 y.o. [name] Primary Care [name]: Sherrie Mustache Other Clinician: Referring Fredda Clarida: Treating [name]/Extender: [name] in Treatment: 11 Clinic Level of Care  Assessment Items TOOL 4 Quantity Score X- 1 0 Use when only an EandM is performed on FOLLOW-UP visit ASSESSMENTS - Nursing Assessment / Reassessment X- 1 10 Reassessment of Co-morbidities (includes updates in patient status) X- 1 5 Reassessment of Adherence to Treatment Plan ASSESSMENTS - Wound and Skin A ssessment / Reassessment []  - 0 Simple Wound Assessment / Reassessment - one wound X- 2 5 Complex Wound Assessment / Reassessment - multiple wounds []  - 0 Dermatologic / Skin Assessment (not related to wound area) ASSESSMENTS - Focused Assessment X- 1 5 Circumferential Edema Measurements - multi extremities []  - 0 Nutritional Assessment / Counseling / Intervention []  - 0 Lower Extremity Assessment (monofilament, tuning fork, pulses) []  - 0 Peripheral Arterial Disease Assessment (using hand held doppler) ASSESSMENTS - Ostomy and/or Continence Assessment and Care []  - 0 Incontinence Assessment and Management []  - 0 Ostomy Care Assessment and Management (repouching, etc.) PROCESS - Coordination of Care X - Simple Patient / Family Education for ongoing care 1 15 []  - 0 Complex (extensive) Patient / Family Education for ongoing care X- 1 10 Staff obtains Programmer, systems, Records, T Results / Process Orders est X- 1 10 Staff telephones HHA, Nursing Homes / Clarify orders / etc []  - 0 Routine Transfer to another Facility (non-emergent condition) []  - 0 Routine Hospital Admission (non-emergent condition) []  - 0 New Admissions / Biomedical engineer / Ordering NPWT Apligraf, etc. , []  - 0 Emergency Hospital Admission (emergent condition) X- 1 10 Simple Discharge Coordination []  - 0 Complex (extensive) Discharge Coordination PROCESS - Special Needs []  - 0 Pediatric / Minor Patient Management []  - 0 Isolation Patient Management []  - 0 Hearing / Language / Visual special needs []  - 0 Assessment of Community assistance (transportation, D/C planning, etc.) []  -  0 Additional assistance / Altered mentation []  - 0 Support Surface(s) Assessment (bed, cushion, seat, etc.) INTERVENTIONS - Wound Cleansing / Measurement []  - 0 Simple Wound Cleansing - one  wound X- 2 5 Complex Wound Cleansing - multiple wounds X- 1 5 Wound Imaging (photographs - any number of wounds) []  - 0 Wound Tracing (instead of photographs) []  - 0 Simple Wound Measurement - one wound X- 2 5 Complex Wound Measurement - multiple wounds INTERVENTIONS - Wound Dressings X - Small Wound Dressing one or multiple wounds 2 10 []  - 0 Medium Wound Dressing one or multiple wounds []  - 0 Large Wound Dressing one or multiple wounds X- 1 5 Application of Medications - topical []  - 0 Application of Medications - injection INTERVENTIONS - Miscellaneous []  - 0 External ear exam []  - 0 Specimen Collection (cultures, biopsies, blood, body fluids, etc.) []  - 0 Specimen(s) / Culture(s) sent or taken to Lab for analysis []  - 0 Patient Transfer (multiple staff / Civil Service fast streamer / Similar devices) []  - 0 Simple Staple / Suture removal (25 or less) []  - 0 Complex Staple / Suture removal (26 or more) []  - 0 Hypo / Hyperglycemic Management (close monitor of Blood Glucose) []  - 0 Ankle / Brachial Index (ABI) - do not check if billed separately X- 1 5 Vital Signs Has the patient been seen at the hospital within the last three years: Yes Total Score: 130 Level Of Care: New/Established - Level 4 Electronic Signature(s) Signed: 10/13/2019 5:01:22 PM By: [name] Entered By: [name] on 10/13/2019 11:43:24 -------------------------------------------------------------------------------- Lower Extremity Assessment Details Patient Name: Date of Service: [name] Schwartz E. 10/13/2019 10:30 A M Medical Record Number: [id number] Patient Account Number: [id number] Date of Birth/Sex: Treating RN: 15-Dec-1923 (86 y.o. [name] Primary Care Ercell Perlman: Sherrie Mustache Other  Clinician: Referring [name]: Treating [name]/Extender: [name] in Treatment: 11 Edema Assessment Assessed: [name]: No] [name]: No] Edema: [Left: N] [Right: o] Calf Left: Right: Point of Measurement: cm From Medial Instep cm 27.7 cm Ankle Left: Right: Point of Measurement: cm From Medial Instep cm 22.1 cm Vascular Assessment Pulses: Dorsalis Pedis Palpable: [Right:Yes] Electronic Signature(s) Signed: 10/13/2019 4:55:10 PM By: Baruch Gouty RN, BSN Entered By: Baruch Gouty on 10/13/2019 11:10:14 -------------------------------------------------------------------------------- Multi Wound Chart Details Patient Name: Date of Service: [name] E. 10/13/2019 10:30 A M Medical Record Number: [id number] Patient Account Number: [id number] Date of Birth/Sex: Treating RN: [birth year] (86 y.o. [name] Primary Care Husein Guedes: Sherrie Mustache Other Clinician: Referring Yalda Herd: Treating Senia Even/Extender: [name] in Treatment: 11 Vital Signs Height(in): 70 Capillary Blood Glucose(mg/dl): 114 Weight(lbs): 140 Pulse(bpm): 106 Body Mass Index(BMI): 20 Blood Pressure(mmHg): 129/82 Temperature(F): 98.1 Respiratory Rate(breaths/min): 18 Photos: [2:No Photos Right T Great oe] [3:No Photos Right Calcaneus] [N/A:N/A N/A] Wound Location: [2:Gradually Appeared] [3:Gradually Appeared] [N/A:N/A] Wounding Event: [2:Diabetic Wound/Ulcer of the Lower] [3:Diabetic Wound/Ulcer of the Lower] [N/A:N/A] Primary Etiology: [2:Extremity N/A] [3:Extremity Arterial Insufficiency Ulcer] [N/A:N/A] Secondary Etiology: [2:Cataracts, Arrhythmia, Hypertension,] [3:Cataracts, Arrhythmia, Hypertension,] [N/A:N/A] Comorbid History: [2:Peripheral Arterial Disease, Type II Diabetes, Osteomyelitis, Dementia, Neuropathy, Received Chemotherapy 06/15/2019] [3:Peripheral Arterial Disease, Type II Diabetes, Osteomyelitis, Dementia,  Neuropathy, Received Chemotherapy  09/21/2019] [N/A:N/A] Date Acquired: [2:11] [3:0] [N/A:N/A] Weeks of Treatment: [2:Open] [3:Open] [N/A:N/A] Wound Status: [2:0.5x0.7x0.1] [3:7x7.8x0.1] [N/A:N/A] Measurements L x W x D (cm) [2:0.275] [3:42.883] [N/A:N/A] A (cm) : rea [2:0.027] [3:4.288] [N/A:N/A] Volume (cm) : [2:-192.60%] [3:N/A] [N/A:N/A] % Reduction in A rea: [2:3.60%] [3:N/A] [N/A:N/A] % Reduction in Volume: [2:Grade 2] [3:Unable to visualize wound bed] [N/A:N/A] Classification: [2:None Present] [3:Medium] [N/A:N/A] Exudate A mount: [2:N/A] [3:Serous] [N/A:N/A] Exudate Type: [2:N/A] [3:amber] [N/A:N/A] Exudate Color: [2:Distinct, outline attached] [  3:Flat and Intact] [N/A:N/A] Wound Margin: [2:None Present (0%)] [3:Small (1-33%)] [N/A:N/A] Granulation A mount: [2:N/A] [3:Pink] [N/A:N/A] Granulation Quality: [2:Large (67-100%)] [3:Large (67-100%)] [N/A:N/A] Necrotic A mount: [2:Eschar] [3:Eschar] [N/A:N/A] Necrotic Tissue: [2:Fat Layer (Subcutaneous Tissue)] [3:Fat Layer (Subcutaneous Tissue)] [N/A:N/A] Exposed Structures: [2:Exposed: Yes Fascia: No Tendon: No Muscle: No Joint: No Bone: No Small (1-33%)] [3:Exposed: Yes Fascia: No Tendon: No Muscle: No Joint: No Bone: No Medium (34-66%)] [N/A:N/A] Treatment Notes Electronic Signature(s) Signed: 10/13/2019 5:01:22 PM By: [name] Signed: 10/13/2019 5:27:17 PM By: Linton Ham MD Entered By: Linton Ham on 10/13/2019 12:27:27 -------------------------------------------------------------------------------- Multi-Disciplinary Care Plan Details Patient Name: Date of Service: Lester Kinsman NK E. 10/13/2019 10:30 A M Medical Record Number: [id number] Patient Account Number: [id number] Date of Birth/Sex: Treating RN: 1923/07/26 (87 y.o. [name] Primary Care Dynasti Kerman: Sherrie Mustache Other Clinician: Referring Klark Vanderhoef: Treating Santasia Rew/Extender: [name] in Treatment:  11 Active Inactive Wound/Skin Impairment Nursing Diagnoses: Knowledge deficit related to ulceration/compromised skin integrity Goals: Patient/caregiver will verbalize understanding of skin care regimen Date Initiated: 07/25/2019 Target Resolution Date: 11/10/2019 Goal Status: Active Ulcer/skin breakdown will have a volume reduction of 30% by week 4 Date Initiated: 07/25/2019 Date Inactivated: 08/28/2019 Target Resolution Date: 08/25/2019 Goal Status: Unmet Unmet Reason: Osteomyelitis Interventions: Assess patient/caregiver ability to obtain necessary supplies Assess patient/caregiver ability to perform ulcer/skin care regimen upon admission and as needed Assess ulceration(s) every visit Notes: Electronic Signature(s) Signed: 10/13/2019 5:01:22 PM By: [name] Entered By: [name] on 10/13/2019 11:37:35 -------------------------------------------------------------------------------- Pain Assessment Details Patient Name: Date of Service: [name] Schwartz E. 10/13/2019 10:30 A M Medical Record Number: [id number] Patient Account Number: [id number] Date of Birth/Sex: Treating RN: 04-30-1923 (86 y.o. [name] Primary Care Zykira Matlack: Sherrie Mustache Other Clinician: Referring Wylder Macomber: Treating Rooney Swails/Extender: [name] in Treatment: 11 Active Problems Location of Pain Severity and Description of Pain Patient Has Paino No Site Locations Rate the pain. Current Pain Level: 0 Pain Management and Medication Current Pain Management: Electronic Signature(s) Signed: 10/13/2019 4:55:10 PM By: Baruch Gouty RN, BSN Entered By: Baruch Gouty on 10/13/2019 11:04:58 -------------------------------------------------------------------------------- Patient/Caregiver Education Details Patient Name: Date of Service: [name] [name] 6/25/2021andnbsp10:30 A M Medical Record Number: [id number] Patient Account Number: [id number] Date of  Birth/Gender: Treating RN: November 10, 1923 (86 y.o. [name] Primary Care Physician: Sherrie Mustache Other Clinician: Referring Physician: Treating Physician/Extender: [name] in Treatment: 11 Education Assessment Education Provided To: Patient and Caregiver Education Topics Provided Wound/Skin Impairment: Handouts: Caring for Your Ulcer Methods: Explain/Verbal Responses: State content correctly Electronic Signature(s) Signed: 10/13/2019 5:01:22 PM By: [name] Entered By: [name] on 10/13/2019 11:38:10 -------------------------------------------------------------------------------- Wound Assessment Details Patient Name: Date of Service: [name] Schwartz E. 10/13/2019 10:30 A M Medical Record Number: [id number] Patient Account Number: [id number] Date of Birth/Sex: Treating RN: 11/14/1923 (86 y.o. [name] Primary Care Nellie Pester: Sherrie Mustache Other Clinician: Referring Nhi Butrum: Treating Priscella Donna/Extender: [name] in Treatment: 11 Wound Status Wound Number: 2 Primary Diabetic Wound/Ulcer of the Lower Extremity Etiology: Wound Location: Right T Great oe Wound Open Wounding Event: Gradually Appeared Status: Date Acquired: 06/15/2019 Comorbid Cataracts, Arrhythmia, Hypertension, Peripheral Arterial Disease, Weeks Of Treatment: 11 History: Type II Diabetes, Osteomyelitis, Dementia, Neuropathy, Received Clustered Wound: No Chemotherapy Wound Measurements Length: (cm) 0.5 Width: (cm) 0.7 Depth: (cm) 0.1 Area: (cm) 0.275 Volume: (cm) 0.027 % Reduction in Area: -192.6% % Reduction in Volume: 3.6% Epithelialization: Small (1-33%) Tunneling: No Undermining:  No Wound Description Classification: Grade 2 Wound Margin: Distinct, outline attached Exudate Amount: None Present Foul Odor After Cleansing: No Slough/Fibrino No Wound Bed Granulation Amount: None Present (0%)  Exposed Structure Necrotic Amount: Large (67-100%) Fascia Exposed: No Necrotic Quality: Eschar Fat Layer (Subcutaneous Tissue) Exposed: Yes Tendon Exposed: No Muscle Exposed: No Joint Exposed: No Bone Exposed: No Electronic Signature(s) Signed: 10/13/2019 4:55:10 PM By: Baruch Gouty RN, BSN Entered By: Baruch Gouty on 10/13/2019 11:21:05 -------------------------------------------------------------------------------- Wound Assessment Details Patient Name: Date of Service: [name] E. 10/13/2019 10:30 A M Medical Record Number: [id number] Patient Account Number: [id number] Date of Birth/Sex: Treating RN: [birth year] (86 y.o. [name] Primary Care Laverta Harnisch: Sherrie Mustache Other Clinician: Referring Jule Whitsel: Treating Lashann Hagg/Extender: [name] in Treatment: 11 Wound Status Wound Number: 3 Primary Diabetic Wound/Ulcer of the Lower Extremity Etiology: Wound Location: Right Calcaneus Secondary Arterial Insufficiency Ulcer Wounding Event: Gradually Appeared Etiology: Date Acquired: 09/21/2019 Wound Open Weeks Of Treatment: 0 Status: Clustered Wound: No Comorbid Cataracts, Arrhythmia, Hypertension, Peripheral Arterial Disease, History: Type II Diabetes, Osteomyelitis, Dementia, Neuropathy, Received Chemotherapy Wound Measurements Length: (cm) 7 Width: (cm) 7.8 Depth: (cm) 0.1 Area: (cm) 42.883 Volume: (cm) 4.288 % Reduction in Area: % Reduction in Volume: Epithelialization: Medium (34-66%) Tunneling: No Undermining: No Wound Description Classification: Unable to visualize wound bed Wound Margin: Flat and Intact Exudate Amount: Medium Exudate Type: Serous Exudate Color: amber Foul Odor After Cleansing: No Slough/Fibrino Yes Wound Bed Granulation Amount: Small (1-33%) Exposed Structure Granulation Quality: Pink Fascia Exposed: No Necrotic Amount: Large (67-100%) Fat Layer (Subcutaneous Tissue) Exposed:  Yes Necrotic Quality: Eschar Tendon Exposed: No Muscle Exposed: No Joint Exposed: No Bone Exposed: No Electronic Signature(s) Signed: 10/13/2019 4:55:10 PM By: Baruch Gouty RN, BSN Entered By: Baruch Gouty on 10/13/2019 11:20:00 -------------------------------------------------------------------------------- Vitals Details Patient Name: Date of Service: [name] E. 10/13/2019 10:30 A M Medical Record Number: [id number] Patient Account Number: [id number] Date of Birth/Sex: Treating RN: 1923-08-29 (86 y.o. [name] Primary Care Rockney Grenz: Sherrie Mustache Other Clinician: Referring Joyann Spidle: Treating Sabrin Dunlevy/Extender: [name] in Treatment: 11 Vital Signs Time Taken: 11:03 Temperature (F): 98.1 Height (in): 70 Pulse (bpm): 106 Source: Stated Respiratory Rate (breaths/min): 18 Weight (lbs): 140 Blood Pressure (mmHg): 129/82 Source: Stated Capillary Blood Glucose (mg/dl): 114 Body Mass Index (BMI): 20.1 Reference Range: 80 - 120 mg / dl Notes glucose per pt report this am Electronic Signature(s) Signed: 10/13/2019 4:55:10 PM By: Baruch Gouty RN, BSN Entered By: Baruch Gouty on 10/13/2019 11:05:32

## 2019-10-15 DIAGNOSIS — E1142 Type 2 diabetes mellitus with diabetic polyneuropathy: Secondary | ICD-10-CM | POA: Diagnosis not present

## 2019-10-15 DIAGNOSIS — Z7984 Long term (current) use of oral hypoglycemic drugs: Secondary | ICD-10-CM | POA: Diagnosis not present

## 2019-10-15 DIAGNOSIS — E1151 Type 2 diabetes mellitus with diabetic peripheral angiopathy without gangrene: Secondary | ICD-10-CM | POA: Diagnosis not present

## 2019-10-15 DIAGNOSIS — R2681 Unsteadiness on feet: Secondary | ICD-10-CM | POA: Diagnosis not present

## 2019-10-15 DIAGNOSIS — R1312 Dysphagia, oropharyngeal phase: Secondary | ICD-10-CM | POA: Diagnosis not present

## 2019-10-15 DIAGNOSIS — C61 Malignant neoplasm of prostate: Secondary | ICD-10-CM | POA: Diagnosis not present

## 2019-10-15 DIAGNOSIS — Z89422 Acquired absence of other left toe(s): Secondary | ICD-10-CM | POA: Diagnosis not present

## 2019-10-15 DIAGNOSIS — M6281 Muscle weakness (generalized): Secondary | ICD-10-CM | POA: Diagnosis not present

## 2019-10-15 DIAGNOSIS — L97522 Non-pressure chronic ulcer of other part of left foot with fat layer exposed: Secondary | ICD-10-CM | POA: Diagnosis not present

## 2019-10-15 DIAGNOSIS — I4821 Permanent atrial fibrillation: Secondary | ICD-10-CM | POA: Diagnosis not present

## 2019-10-15 DIAGNOSIS — I10 Essential (primary) hypertension: Secondary | ICD-10-CM | POA: Diagnosis not present

## 2019-10-15 DIAGNOSIS — L97514 Non-pressure chronic ulcer of other part of right foot with necrosis of bone: Secondary | ICD-10-CM | POA: Diagnosis not present

## 2019-10-15 DIAGNOSIS — Z7901 Long term (current) use of anticoagulants: Secondary | ICD-10-CM | POA: Diagnosis not present

## 2019-10-15 DIAGNOSIS — C8581 Other specified types of non-Hodgkin lymphoma, lymph nodes of head, face, and neck: Secondary | ICD-10-CM | POA: Diagnosis not present

## 2019-10-15 DIAGNOSIS — E11621 Type 2 diabetes mellitus with foot ulcer: Secondary | ICD-10-CM | POA: Diagnosis not present

## 2019-10-16 DIAGNOSIS — L97522 Non-pressure chronic ulcer of other part of left foot with fat layer exposed: Secondary | ICD-10-CM | POA: Diagnosis not present

## 2019-10-16 DIAGNOSIS — L97514 Non-pressure chronic ulcer of other part of right foot with necrosis of bone: Secondary | ICD-10-CM | POA: Diagnosis not present

## 2019-10-16 DIAGNOSIS — C61 Malignant neoplasm of prostate: Secondary | ICD-10-CM | POA: Diagnosis not present

## 2019-10-16 DIAGNOSIS — E1142 Type 2 diabetes mellitus with diabetic polyneuropathy: Secondary | ICD-10-CM | POA: Diagnosis not present

## 2019-10-16 DIAGNOSIS — C8581 Other specified types of non-Hodgkin lymphoma, lymph nodes of head, face, and neck: Secondary | ICD-10-CM | POA: Diagnosis not present

## 2019-10-16 DIAGNOSIS — E11621 Type 2 diabetes mellitus with foot ulcer: Secondary | ICD-10-CM | POA: Diagnosis not present

## 2019-10-17 ENCOUNTER — Encounter (HOSPITAL_COMMUNITY): Payer: Self-pay | Admitting: *Deleted

## 2019-10-17 ENCOUNTER — Ambulatory Visit (INDEPENDENT_AMBULATORY_CARE_PROVIDER_SITE_OTHER): Payer: Medicare Other | Admitting: Vascular Surgery

## 2019-10-17 ENCOUNTER — Other Ambulatory Visit: Payer: Self-pay

## 2019-10-17 ENCOUNTER — Encounter (HOSPITAL_COMMUNITY): Payer: Medicare Other

## 2019-10-17 ENCOUNTER — Encounter: Payer: Self-pay | Admitting: Vascular Surgery

## 2019-10-17 VITALS — BP 177/94 | HR 80 | Temp 97.3°F | Resp 18

## 2019-10-17 DIAGNOSIS — I739 Peripheral vascular disease, unspecified: Secondary | ICD-10-CM

## 2019-10-17 MED ORDER — TRAMADOL HCL 50 MG PO TABS: 50.0000 mg | ORAL_TABLET | Freq: Four times a day (QID) | ORAL | 0 refills | Status: DC | PRN

## 2019-10-17 NOTE — Progress Notes (Signed)
Patient name: [name] MRN: [id number] DOB: 1923-12-10 Sex: male  REASON FOR VISIT: Follow-up for right heel wound  HPI: [name] is a 86 y.o. male with history of diabetes, atrial fibrillation, hypertension, hyperlipidemia, PAD that presents for one week follow-up of right foot tissue loss.  [name] recently underwent right posterior tibial intervention for critical limb ischemia with tissue loss (great toe ulcer).  Postop course was complicated by syncopal event while [name] was having a bowel movement in recovery and [name] was admitted for observation.  [name] then had ongoing issues with orthostasis and this was felt to be possibly related to adrenal insufficiency but as treated with steroids and appreciate the hospitalist input.    [name] is now going to wound clinic and putting Betadine on the heel.  The tramadol appears to be controlling his pain at night.  [name] describes all of his pain around this heel wound.  Past Medical History:  Diagnosis Date  . Acute bronchitis   . Acute lower UTI 11/25/2017  . Amputation of toe of left foot ([place]) 07/30/2017, 08/27/2017    Great toe 07/30/2017 Second toe 08/27/2017   . Arthritis   . Aspiration pneumonia ([name]) 01/31/2018  . Atrial fibrillation ([name])    Dr. Harl Bowie- [place] follows saw 11'14  . Bladder stones    tx. with oral meds and antibiotics, now surgery planned  . Bone metastases ([place]) 08/21/2015  . Cataracts, both eyes    surgery planned May 2015  . Community acquired pneumonia 04/16/2018  . Cystoid macular edema 04/21/2015    Right eye 04/21/2015   . Dehydration 04/16/2018  . Dementia without behavioral disturbance ([name]) 04/16/2018  . Diabetes mellitus without complication ([name])    Type II  . Diarrhea 11/25/2017  . Dyspnea    with activity  . Dysrhythmia    Afib  . Family history of breast cancer   . Family history of colon cancer   . Genetic testing 02/09/2017  . GERD (gastroesophageal reflux disease)   . GIST (gastrointestinal  stromal tumor), malignant ([name]) 2005   Gastrointestinal stromal tumor, that is GIST, small bowel, 4.5 cm, intermediate prognostic grade found on the PET scan in the small bowel, accounting for that small bowel activity in October 2005 with resection by Dr. Margot Chimes, thus far without recurrence.   Marland Kitchen History of MRI of lumbar spine 03/05/2014  . Hypertension   . Hypertrophy of prostate with urinary obstruction   . Hypomagnesemia 03/16/2017  . Metabolic encephalopathy 38/75/6433  . Metastatic adenocarcinoma to prostate ([place]) 09/08/2013  . 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 NHL (non-Hodgkin's lymphoma) ([name]) 2005   Diffuse large B-cell lymphoma, clinically stage IIIA, CD20 positive, status post cervical lymph node biopsy 08/03/2003 on the left. PET scan was also positive in the spleen and small bowel region, but bone marrow aspiration and biopsy were negative. So [name] essentially had stage IIIAs. [name] received R-CHOP x6 cycles with CR established by PET scan criteria on 11/23/2003 with no evidence for relapse th  . Numbness 02/19/2017   Per [place] new patient packet  . Osteomyelitis ([name]) 06/29/2017   toe of left foot   . PAD (peripheral artery disease) ([name]) 08/27/2017  . Polyneuropathy 02/19/2017  . Postural dizziness with presyncope 02/19/2017  . S/P TURP 08/10/2013  . Sepsis ([place]) 07/02/2018  . Skin cancer    Basal cell- face,head, neck,  arms, legs, Back  . Urinary frequency 02/19/2017    Past Surgical History:  Procedure  Laterality Date  . ABDOMINAL AORTOGRAM W/LOWER EXTREMITY N/A 09/20/2019   Procedure: ABDOMINAL AORTOGRAM W/LOWER EXTREMITY;  Surgeon: Marty Heck, MD;  Location: [facility];  Service: Cardiovascular;  Laterality: N/A;  . AMPUTATION Left 06/29/2017   Procedure: AMPUTATION LEFT GREAT TOE;  Surgeon: [name], MD;  Location: [name];  Service: Vascular;  Laterality: Left;  . AMPUTATION Left 08/27/2017   Procedure: AMPUTATION Left SECOND TOE;   Surgeon: [name], MD;  Location: [name];  Service: Vascular;  Laterality: Left;  . CATARACT EXTRACTION Bilateral 2015  . CHOLECYSTECTOMY    . COLON RESECTION     small bowel  . CYSTOSCOPY WITH LITHOLAPAXY N/A 08/10/2013   Procedure: CYSTOSCOPY WITH LITHOLAPAXY WITH [name];  Surgeon: [name], MD;  Location: [facility];  Service: Urology;  Laterality: N/A;  . ESOPHAGEAL DILATION N/A 02/27/2016   Procedure: ESOPHAGEAL DILATION;  Surgeon: [name], MD;  [facility];  Service: Endoscopy;  Laterality: N/A;  . ESOPHAGEAL DILATION N/A 07/07/2018   Procedure: ESOPHAGEAL DILATION;  Surgeon: [name], MD;  [facility];  Service: Endoscopy;  Laterality: N/A;  . ESOPHAGOGASTRODUODENOSCOPY N/A 02/27/2016   Procedure: ESOPHAGOGASTRODUODENOSCOPY (EGD);  Surgeon: [name], MD;  [facility];  Service: Endoscopy;  Laterality: N/A;  12:00  . ESOPHAGOGASTRODUODENOSCOPY N/A 07/07/2018   Procedure: ESOPHAGOGASTRODUODENOSCOPY (EGD);  Surgeon: [name], MD;  [facility];  Service: Endoscopy;  Laterality: N/A;  . INCISION AND DRAINAGE PERIRECTAL ABSCESS    . LOWER EXTREMITY ANGIOGRAPHY N/A 06/25/2017   Procedure: LOWER EXTREMITY ANGIOGRAPHY;  Surgeon: [name], MD;  Location: [facility];  Service: Cardiovascular;  Laterality: N/A;  . LYMPH NODE DISSECTION Left    '05-neck  . PERIPHERAL VASCULAR ATHERECTOMY Right 09/20/2019   Procedure: PERIPHERAL VASCULAR ATHERECTOMY;  Surgeon: Marty Heck, MD;  Location: [facility];  Service: Cardiovascular;  Laterality: Right;  Posterior tibial  . [name]-A-CATH REMOVAL    . PORTACATH PLACEMENT     insertion and removal -last chemotherapy 10 yrs ago  . TRANSURETHRAL RESECTION OF PROSTATE N/A 08/10/2013   Procedure: TRANSURETHRAL RESECTION OF THE PROSTATE WITH GYRUS INSTRUMENTS;  Surgeon: [name], MD;  Location: [facility];  Service: Urology;  Laterality:  N/A;    Family History  Problem Relation Age of Onset  . Heart attack Mother        died in her 76s  . Other Father 69       pneumonia - died  . Breast cancer Sister        dx in her 40s-60s  . Colon cancer Brother        dx in his 58s  . Cancer Sister        TAH/BSO due to "male cancer"  . Breast cancer Daughter 49  . Breast cancer Daughter 85       DCIS - ATM VUS on [employer] 83 gene panel in 2018  . Breast cancer Daughter 7       DCIS - Negative on [employer] MyRisk 25 gene apnel in 2015  . Thyroid cancer Daughter 52       Medullary and Papillary  . Thyroid nodules Grandchild     SOCIAL HISTORY: Social History   Tobacco Use  . Smoking status: Never Smoker  . Smokeless tobacco: Never Used  Substance Use Topics  . Alcohol use: No    Allergies  Allergen Reactions  . Tape  Other (See Comments)    SKIN IS VERY THIN AND WILL TEAR AND BRUISE VERY EASILY!!!!  . Augmentin [Amoxicillin-Pot Clavulanate] Rash and Other (See Comments)    Redness of skin    Current Outpatient Medications  Medication Sig Dispense Refill  . abiraterone acetate (ZYTIGA) 250 MG tablet Take 1 tablet (250 mg total) by mouth daily before breakfast. Take on an empty stomach 1 hour before or 2 hours after a meal 120 tablet 0  . acetaminophen (TYLENOL) 500 MG tablet Take 1 tablet (500 mg total) by mouth 3 (three) times daily. 30 tablet 0  . apixaban (ELIQUIS) 5 MG TABS tablet Take 5 mg by mouth 2 (two) times daily.    . blood glucose meter kit and supplies Dispense based on patient and insurance preference. Use four times daily as directed. (FOR ICD-10 E10.9, E11.9). 1 each 0  . Calcium Polycarbophil (FIBER-CAPS PO) Take 2-3 capsules by mouth See admin instructions. Take 3 capsules by mouth in the morning & take 2 capsules by mouth in the evening.    . Cholecalciferol (VITAMIN D3) 1000 units CAPS Take 1,000 Units by mouth daily.     . clopidogrel (PLAVIX) 75 MG tablet Take 1 tablet (75 mg total) by mouth  daily. 30 tablet 11  . clotrimazole-betamethasone (LOTRISONE) cream Apply 1 application topically 2 (two) times daily as needed (skin irriation.).     Marland Kitchen Cranberry 500 MG TABS Take 500 mg by mouth daily.     Marland Kitchen denosumab (XGEVA) 120 MG/1.7ML SOLN injection Inject 120 mg into the skin every 30 (thirty) days.    . diclofenac Sodium (VOLTAREN) 1 % GEL Apply 2 g topically 4 (four) times daily. 150 g 0  . diltiazem (CARDIZEM) 60 MG tablet Take 60 mg by mouth 2 (two) times daily.    Marland Kitchen donepezil (ARICEPT) 10 MG tablet Take 1 tablet by mouth once daily 90 tablet 0  . feeding supplement, ENSURE ENLIVE, (ENSURE ENLIVE) LIQD Take 237 mLs by mouth daily. CHOCOLATE     . fluticasone (FLONASE) 50 MCG/ACT nasal spray Place 2 sprays into both nostrils daily as needed (allergies.).     Marland Kitchen furosemide (LASIX) 20 MG tablet Take 20 mg by mouth daily as needed (retention/swelling.).     Marland Kitchen glucose blood (EASYMAX TEST) test strip Use as instructed 100 each 12  . Lancets 28G MISC 1 kit by Does not apply route every morning. 100 each 1  . Leuprolide Acetate, 6 Month, (LUPRON DEPOT, 64-MONTH,) 45 MG injection Inject 45 mg into the muscle every 6 (six) months.    . metFORMIN (GLUCOPHAGE) 850 MG tablet Take 850 mg by mouth 2 (two) times daily with a meal.     . mirtazapine (REMERON) 15 MG tablet Take 1 tablet (15 mg total) by mouth at bedtime. 30 tablet 0  . Multiple Vitamin (MULTIVITAMIN WITH MINERALS) TABS tablet Take 1 tablet by mouth daily.     . nitroGLYCERIN (NITRODUR - DOSED IN MG/24 HR) 0.2 mg/hr patch Place 1 patch (0.2 mg total) onto the skin daily. 30 patch 12  . predniSONE (DELTASONE) 2.5 MG tablet Take 2.5 mg by mouth 2 (two) times daily with a meal.    . Probiotic Product (PROBIOTIC DAILY PO) Take 1 tablet by mouth daily. Every morning    . traMADol (ULTRAM) 50 MG tablet Take 1 tablet (50 mg total) by mouth every 6 (six) hours as needed for moderate pain. 30 tablet 0  . trimethoprim (TRIMPEX) 100 MG tablet Take  1  tablet (100 mg total) by mouth at bedtime. 90 tablet 3  . cephALEXin (KEFLEX) 250 MG capsule Take 1 capsule (250 mg total) by mouth 2 (two) times daily. (Patient not taking: Reported on 10/17/2019) 28 capsule 0   No current facility-administered medications for this visit.    REVIEW OF SYSTEMS:  _0  denotes positive finding, _1  denotes negative finding Cardiac  Comments:  Chest pain or chest pressure:    Shortness of breath upon exertion:    Short of breath when lying flat:    Irregular heart rhythm:        Vascular    Pain in calf, thigh, or hip brought on by ambulation:    Pain in feet at night that wakes you up from your sleep:     Blood clot in your veins:    Leg swelling:         Pulmonary    Oxygen at home:    Productive cough:     Wheezing:         Neurologic    Sudden weakness in arms or legs:     Sudden numbness in arms or legs:     Sudden onset of difficulty speaking or slurred speech:    Temporary loss of vision in one eye:     Problems with dizziness:         Gastrointestinal    Blood in stool:     Vomited blood:         Genitourinary    Burning when urinating:     Blood in urine:        Psychiatric    Major depression:         Hematologic    Bleeding problems:    Problems with blood clotting too easily:        Skin    Rashes or ulcers:        Constitutional    Fever or chills:      PHYSICAL EXAM: Vitals:   10/17/19 1500  BP: (!) 177/94  Pulse: 80  Resp: 18  Temp: (!) 97.3 F (36.3 C)  TempSrc: Temporal  SpO2: 99%    GENERAL: The patient is a well-nourished male, in no acute distress. The vital signs are documented above. CARDIAC: There is a regular rate and rhythm.  VASCULAR:  Right heel pictured below Dry ulcer right great toe ABDOMEN: Soft and non-tender with normal pitched bowel sounds.  MUSCULOSKELETAL: There are no major deformities or cyanosis. NEUROLOGIC: No focal weakness or paresthesias are  detected.           DATA:   None  Assessment/Plan:  86 year old male who presented with critical limb ischemia of the right lower extremity with a nonhealing wound to the great toe.  Ultimately [name] had severe tibial disease and dominant runoff was through the anterior tibial artery that was patent down to the ankle but had very poor outflow due to small vessel disease.  Ultimately underwent posterior tibial intervention for severely calcified artery with chronic total occlusion distally and likely had some atheroembolism to the heel.    Appreciate the care of Dr. Dellia Nims at wound care and will continue with his recommendations for Betadine to the heel.  [name] has a large eschar and will have to see if this sloughs off and what the tissue underneath this looks like.  Still high risk for limb loss as discussed with the daughter previously.  Will arrange follow-up in 2 weeks for  continued monitoring to ensure no signs of infection.  Pain seems to be controlled with tramadol at this time.  New prescription sent to his pharmacy.   Marty Heck, MD [facility] Office: [phone]

## 2019-10-17 NOTE — Progress Notes (Signed)
[name], Foster ([id number]) Visit Report for 06/13/2019 Arrival Information Details Patient Name: Date of Service: [name] 06/13/2019 2:30 PM Medical Record Number: [id number] Patient Account Number: [id number] Date of Birth/Sex: Treating RN: 04-Mar-1924 (86 y.o. [name]) [name] Primary Care Liylah Najarro: [name] Other Clinician: Referring Terryn Redner: Treating Denaja Verhoeven/Extender: Deloria Lair in Treatment: 6 Visit Information History Since Last Visit Added or deleted any medications: No Patient Arrived: Wheel Chair Any new allergies or adverse reactions: No Arrival Time: 15:01 Had a fall or experienced change in No Accompanied By: daughter activities of daily living that may affect Transfer Assistance: None risk of falls: Patient Identification Verified: Yes Signs or symptoms of abuse/neglect since last visito No Secondary Verification Process Completed: Yes Hospitalized since last visit: No Patient Requires Transmission-Based Precautions: No Implantable device outside of the clinic excluding No Patient Has Alerts: No cellular tissue based products placed in the center since last visit: Has Dressing in Place as Prescribed: Yes Pain Present Now: No Electronic Signature(s) Signed: 08/02/2019 9:23:26 AM By: Sandre Kitty Entered By: Sandre Kitty on 06/13/2019 15:03:26 -------------------------------------------------------------------------------- Clinic Level of Care Assessment Details Patient Name: Date of Service: [name] 06/13/2019 2:30 PM Medical Record Number: [id number] Patient Account Number: [id number] Date of Birth/Sex: Treating RN: [birth year] (86 y.o. [name]) [name] Primary Care [name]: [name] Other Clinician: Referring [name]: Treating [name]/Extender: Deloria Lair in Treatment: 6 Clinic Level of Care Assessment Items TOOL 4 Quantity Score X- 1 0 Use when only an  EandM is performed on FOLLOW-UP visit ASSESSMENTS - Nursing Assessment / Reassessment X- 1 10 Reassessment of Co-morbidities (includes updates in patient status) X- 1 5 Reassessment of Adherence to Treatment Plan ASSESSMENTS - Wound and Skin A ssessment / Reassessment X - Simple Wound Assessment / Reassessment - one wound 1 5 []  - 0 Complex Wound Assessment / Reassessment - multiple wounds []  - 0 Dermatologic / Skin Assessment (not related to wound area) ASSESSMENTS - Focused Assessment []  - 0 Circumferential Edema Measurements - multi extremities []  - 0 Nutritional Assessment / Counseling / Intervention []  - 0 Lower Extremity Assessment (monofilament, tuning fork, pulses) []  - 0 Peripheral Arterial Disease Assessment (using hand held doppler) ASSESSMENTS - Ostomy and/or Continence Assessment and Care []  - 0 Incontinence Assessment and Management []  - 0 Ostomy Care Assessment and Management (repouching, etc.) PROCESS - Coordination of Care X - Simple Patient / Family Education for ongoing care 1 15 []  - 0 Complex (extensive) Patient / Family Education for ongoing care X- 1 10 Staff obtains Consents, Records, T Results / Process Orders est []  - 0 Staff telephones HHA, Nursing Homes / Clarify orders / etc []  - 0 Routine Transfer to another Facility (non-emergent condition) []  - 0 Routine Hospital Admission (non-emergent condition) []  - 0 New Admissions / Biomedical engineer / Ordering NPWT Apligraf, etc. , []  - 0 Emergency Hospital Admission (emergent condition) X- 1 10 Simple Discharge Coordination []  - 0 Complex (extensive) Discharge Coordination PROCESS - Special Needs []  - 0 Pediatric / Minor Patient Management []  - 0 Isolation Patient Management []  - 0 Hearing / Language / Visual special needs []  - 0 Assessment of Community assistance (transportation, D/C planning, etc.) []  - 0 Additional assistance / Altered mentation []  - 0 Support Surface(s)  Assessment (bed, cushion, seat, etc.) INTERVENTIONS - Wound Cleansing / Measurement X - Simple Wound Cleansing - one wound 1 5 []  - 0 Complex Wound Cleansing - multiple  wounds X- 1 5 Wound Imaging (photographs - any number of wounds) []  - 0 Wound Tracing (instead of photographs) X- 1 5 Simple Wound Measurement - one wound []  - 0 Complex Wound Measurement - multiple wounds INTERVENTIONS - Wound Dressings []  - 0 Small Wound Dressing one or multiple wounds []  - 0 Medium Wound Dressing one or multiple wounds []  - 0 Large Wound Dressing one or multiple wounds X- 1 5 Application of Medications - topical []  - 0 Application of Medications - injection INTERVENTIONS - Miscellaneous []  - 0 External ear exam []  - 0 Specimen Collection (cultures, biopsies, blood, body fluids, etc.) []  - 0 Specimen(s) / Culture(s) sent or taken to Lab for analysis []  - 0 Patient Transfer (multiple staff / Civil Service fast streamer / Similar devices) []  - 0 Simple Staple / Suture removal (25 or less) []  - 0 Complex Staple / Suture removal (26 or more) []  - 0 Hypo / Hyperglycemic Management (close monitor of Blood Glucose) []  - 0 Ankle / Brachial Index (ABI) - do not check if billed separately X- 1 5 Vital Signs Has the patient been seen at the hospital within the last three years: Yes Total Score: 80 Level Of Care: New/Established - Level 3 Electronic Signature(s) Signed: 06/13/2019 6:06:02 PM By: [name] Coria RN Entered By: [name] on 06/13/2019 15:39:29 -------------------------------------------------------------------------------- Encounter Discharge Information Details Patient Name: Date of Service: [name] 06/13/2019 2:30 PM Medical Record Number: [id number] Patient Account Number: [id number] Date of Birth/Sex: Treating RN: December 20, 1923 (86 y.o. [name] Primary Care [name]: [name] Other Clinician: Referring Journey Ratterman: Treating Efraim Vanallen/Extender: Deloria Lair in Treatment: 6 Encounter Discharge Information Items Discharge Condition: Stable Ambulatory Status: Wheelchair Discharge Destination: Home Transportation: Private Auto Accompanied By: daughter Schedule Follow-up Appointment: Yes Clinical Summary of Care: Patient Declined Electronic Signature(s) Signed: 06/13/2019 6:06:02 PM By: [name] Coria RN Entered By: [name] on 06/13/2019 15:41:28 -------------------------------------------------------------------------------- Lower Extremity Assessment Details Patient Name: Date of Service: Michaele Offer 06/13/2019 2:30 PM Medical Record Number: [id number] Patient Account Number: [id number] Date of Birth/Sex: Treating RN: 1923-11-20 (86 y.o. [name] Primary Care Classie Weng: [name] Other Clinician: Referring Dyesha Henault: Treating Amare Bail/Extender: Deloria Lair in Treatment: 6 Edema Assessment Assessed: Shirlyn Goltz: No] Patrice Paradise: No] Edema: [Left: N] [Right: o] Calf Left: Right: Point of Measurement: cm From Medial Instep cm 30 cm Ankle Left: Right: Point of Measurement: cm From Medial Instep cm 20.5 cm Vascular Assessment Pulses: Dorsalis Pedis Palpable: [Right:Yes] Electronic Signature(s) Signed: 06/15/2019 8:56:01 AM By: Levan Hurst RN, BSN Entered By: Levan Hurst on 06/13/2019 15:17:32 -------------------------------------------------------------------------------- Multi Wound Chart Details Patient Name: Date of Service: [name] 06/13/2019 2:30 PM Medical Record Number: [id number] Patient Account Number: [id number] Date of Birth/Sex: Treating RN: Oct 31, 1923 (86 y.o. [name] Primary Care Isabel Ardila: [name] Other Clinician: Referring Kirbi Farrugia: Treating Edelmiro Innocent/Extender: Deloria Lair in Treatment: 6 Vital Signs Height(in): 70 Capillary Blood Glucose(mg/dl): 130 Weight(lbs): 150 Pulse(bpm): 23 Body Mass  Index(BMI): 22 Blood Pressure(mmHg): 157/83 Temperature(F): 97.7 Respiratory Rate(breaths/min): 18 Photos: [1:No Photos Right T Great oe] [N/A:N/A N/A] Wound Location: [1:Gradually Appeared] [N/A:N/A] Wounding Event: [1:Diabetic Wound/Ulcer of the Lower] [N/A:N/A] Primary Etiology: [1:Extremity Cataracts, Arrhythmia, Hypertension,] [N/A:N/A] Comorbid History: [1:Peripheral Arterial Disease, Type II Diabetes, Osteomyelitis, Dementia, Neuropathy, Received Chemotherapy 02/28/2019] [N/A:N/A] Date Acquired: [1:6] [N/A:N/A] Weeks of Treatment: [1:Healed - Epithelialized] [N/A:N/A] Wound Status: [1:0x0x0] [N/A:N/A] Measurements L x W x D (cm) [1:0] [N/A:N/A]  A (cm) : rea [1:0] [N/A:N/A] Volume (cm) : [1:100.00%] [N/A:N/A] % Reduction in A rea: [1:100.00%] [N/A:N/A] % Reduction in Volume: [1:Grade 1] [N/A:N/A] Classification: [1:None Present] [N/A:N/A] Exudate A mount: [1:Thickened] [N/A:N/A] Wound Margin: [1:None Present (0%)] [N/A:N/A] Granulation A mount: [1:None Present (0%)] [N/A:N/A] Necrotic A mount: [1:Fascia: No] [N/A:N/A] Exposed Structures: [1:Fat Layer (Subcutaneous Tissue) Exposed: No Tendon: No Muscle: No Joint: No Bone: No Large (67-100%)] [N/A:N/A] Treatment Notes Electronic Signature(s) Signed: 06/13/2019 6:00:34 PM By: Linton Ham MD Signed: 06/13/2019 6:06:02 PM By: [name] Coria RN Entered By: Linton Ham on 06/13/2019 15:47:47 -------------------------------------------------------------------------------- Multi-Disciplinary Care Plan Details Patient Name: Date of Service: [name] 06/13/2019 2:30 PM Medical Record Number: [id number] Patient Account Number: [id number] Date of Birth/Sex: Treating RN: 1923-07-26 (86 y.o. [name] Primary Care Alizae Bechtel: [name] Other Clinician: Referring Kennya Schwenn: Treating Cesily Cuoco/Extender: Deloria Lair in Treatment: 6 Active Inactive Electronic Signature(s) Signed:  06/13/2019 6:06:02 PM By: [name] Coria RN Entered By: [name] on 06/13/2019 15:38:36 -------------------------------------------------------------------------------- Pain Assessment Details Patient Name: Date of Service: [name] 06/13/2019 2:30 PM Medical Record Number: [id number] Patient Account Number: [id number] Date of Birth/Sex: Treating RN: 07-20-1923 (86 y.o. [name] Primary Care Rocco Kerkhoff: [name] Other Clinician: Referring Gaven Eugene: Treating Remo Kirschenmann/Extender: Deloria Lair in Treatment: 6 Active Problems Location of Pain Severity and Description of Pain Patient Has Paino No Site Locations Pain Management and Medication Current Pain Management: Electronic Signature(s) Signed: 06/13/2019 6:06:02 PM By: [name] Coria RN Signed: 08/02/2019 9:23:26 AM By: Sandre Kitty Entered By: Sandre Kitty on 06/13/2019 15:04:27 -------------------------------------------------------------------------------- Patient/Caregiver Education Details Patient Name: Date of Service: [name] 2/23/2021andnbsp2:30 PM Medical Record Number: [id number] Patient Account Number: [id number] Date of Birth/Gender: Treating RN: [birth year] (86 y.o. [name] Primary Care Physician: [name] Other Clinician: Referring Physician: Treating Physician/Extender: Deloria Lair in Treatment: 6 Education Assessment Education Provided To: Patient Education Topics Provided Wound/Skin Impairment: Methods: Explain/Verbal Responses: State content correctly Electronic Signature(s) Signed: 06/13/2019 6:06:02 PM By: [name] Coria RN Entered By: [name] on 06/13/2019 15:38:47 -------------------------------------------------------------------------------- Wound Assessment Details Patient Name: Date of Service: [name] 06/13/2019 2:30 PM Medical Record Number: [id number] Patient Account  Number: [id number] Date of Birth/Sex: Treating RN: 07/11/1923 (86 y.o. [name]) [name] Primary Care Venia Riveron: [name] Other Clinician: Referring Rashon Rezek: Treating Zeno Hickel/Extender: Deloria Lair in Treatment: 6 Wound Status Wound Number: 1 Primary Diabetic Wound/Ulcer of the Lower Extremity Etiology: Wound Location: Right T Great oe Wound Healed - Epithelialized Wounding Event: Gradually Appeared Status: Date Acquired: 02/28/2019 Comorbid Cataracts, Arrhythmia, Hypertension, Peripheral Arterial Disease, Weeks Of Treatment: 6 History: Type II Diabetes, Osteomyelitis, Dementia, Neuropathy, Received Clustered Wound: No Chemotherapy Photos Wound Measurements Length: (cm) Width: (cm) Depth: (cm) Area: (cm) Volume: (cm) 0 % Reduction in Area: 100% 0 % Reduction in Volume: 100% 0 Epithelialization: Large (67-100%) 0 Tunneling: No 0 Undermining: No Wound Description Classification: Grade 1 Wound Margin: Thickened Exudate Amount: None Present Foul Odor After Cleansing: No Slough/Fibrino No Wound Bed Granulation Amount: None Present (0%) Exposed Structure Necrotic Amount: None Present (0%) Fascia Exposed: No Fat Layer (Subcutaneous Tissue) Exposed: No Tendon Exposed: No Muscle Exposed: No Joint Exposed: No Bone Exposed: No Electronic Signature(s) Signed: 06/14/2019 5:09:01 PM By: Mikeal Hawthorne EMT/HBOT Signed: 10/17/2019 5:26:37 PM By: [name] Coria RN Previous Signature: 06/13/2019 6:06:02 PM Version By: [name] Coria RN Entered By: Mikeal Hawthorne on 06/14/2019 14:33:20 --------------------------------------------------------------------------------  Vitals Details Patient Name: Date of Service: [name] 06/13/2019 2:30 PM Medical Record Number: [id number] Patient Account Number: [id number] Date of Birth/Sex: Treating RN: 1923/08/12 (86 y.o. [name]) [name] Primary Care Meeah Totino: [name] Other Clinician: Referring  Nell Gales: Treating Nafisa Olds/Extender: Deloria Lair in Treatment: 6 Vital Signs Time Taken: 15:03 Temperature (F): 97.7 Height (in): 70 Pulse (bpm): 73 Weight (lbs): 150 Respiratory Rate (breaths/min): 18 Body Mass Index (BMI): 21.5 Blood Pressure (mmHg): 157/83 Capillary Blood Glucose (mg/dl): 130 Reference Range: 80 - 120 mg / dl Electronic Signature(s) Signed: 08/02/2019 9:23:26 AM By: Sandre Kitty Entered By: Sandre Kitty on 06/13/2019 15:04:20

## 2019-10-17 NOTE — Progress Notes (Signed)
Received a call from [facility], Pharmacist with specialty pharmacy.  He needed to clarify that patient was infact only taking 250 mg daily.  I verified this with him and they confirm that they will ship out to patient.  Nothing further needed.

## 2019-10-18 ENCOUNTER — Ambulatory Visit: Payer: Self-pay | Admitting: *Deleted

## 2019-10-18 ENCOUNTER — Other Ambulatory Visit (HOSPITAL_COMMUNITY): Payer: Self-pay | Admitting: Nurse Practitioner

## 2019-10-19 ENCOUNTER — Ambulatory Visit: Payer: Self-pay | Admitting: *Deleted

## 2019-10-19 ENCOUNTER — Telehealth: Payer: Self-pay

## 2019-10-19 NOTE — Telephone Encounter (Signed)
I shared Jessica's response with Tye Maryland and she will contact Palliative Care.  I will follow-up again once I hear back from [facility]

## 2019-10-19 NOTE — Telephone Encounter (Signed)
Incoming call received from patients daughter indicating that patients rear end is hurting due to him sitting all day.   [name] is requesting an order for an electric variable seat cushion.  [name] is also requesting information on who supplies this type of equipment. I advised [name] to call [facility].   I called [place] and was redirected to call [facility] (DME department) @ [phone] to see if they provide requested equipment. I left a message on voicemail requesting a return call.  In the interim I will send to [name], NP to see if she is willing to provide order and if so to advise on any recommendations on who can provide supply

## 2019-10-19 NOTE — Telephone Encounter (Signed)
They are working with palliative care (of [facility]) and I bet they will be able to get supplies for him

## 2019-10-20 ENCOUNTER — Ambulatory Visit: Payer: Self-pay | Admitting: *Deleted

## 2019-10-24 ENCOUNTER — Ambulatory Visit: Payer: Self-pay | Admitting: *Deleted

## 2019-10-24 ENCOUNTER — Other Ambulatory Visit: Payer: Self-pay | Admitting: Family Medicine

## 2019-10-24 ENCOUNTER — Telehealth: Payer: Self-pay

## 2019-10-24 DIAGNOSIS — R413 Other amnesia: Secondary | ICD-10-CM

## 2019-10-24 NOTE — Telephone Encounter (Signed)
FYI:  Patients daughter called stating she spoke with representative from [facility]  (coming out per orders from vein and vascular and the wound center)was told that patients home health wound care will end sometime this month (no specific date was provided). [name] states [facility] did not give her an explanation other than they do not feel they can help the patient. [name] feels like home health is giving up on her father. [name] is awaiting a call from the [facility] case manager to further discuss. No action is needed from [name] Chandler, NP at this time.

## 2019-10-24 NOTE — Telephone Encounter (Signed)
I called [name] back to check the status of her request. Per Tye Maryland she contacted [facility] and they gave her a web site ([email or web address]) and the equipment was over 6000 dollars. The family has opted to do without seat cushion at this time.  Tye Maryland will give [facility] (I provided number) a call since I have not heard back from them and if their price is more reasonable she will call to have [place] submit an order, otherwise they will do without.

## 2019-10-25 ENCOUNTER — Ambulatory Visit: Payer: Self-pay | Admitting: *Deleted

## 2019-10-25 DIAGNOSIS — L97522 Non-pressure chronic ulcer of other part of left foot with fat layer exposed: Secondary | ICD-10-CM | POA: Diagnosis not present

## 2019-10-25 DIAGNOSIS — E11621 Type 2 diabetes mellitus with foot ulcer: Secondary | ICD-10-CM | POA: Diagnosis not present

## 2019-10-25 DIAGNOSIS — E1142 Type 2 diabetes mellitus with diabetic polyneuropathy: Secondary | ICD-10-CM | POA: Diagnosis not present

## 2019-10-25 DIAGNOSIS — C8581 Other specified types of non-Hodgkin lymphoma, lymph nodes of head, face, and neck: Secondary | ICD-10-CM | POA: Diagnosis not present

## 2019-10-25 DIAGNOSIS — L97514 Non-pressure chronic ulcer of other part of right foot with necrosis of bone: Secondary | ICD-10-CM | POA: Diagnosis not present

## 2019-10-25 DIAGNOSIS — C61 Malignant neoplasm of prostate: Secondary | ICD-10-CM | POA: Diagnosis not present

## 2019-10-27 ENCOUNTER — Other Ambulatory Visit: Payer: Self-pay

## 2019-10-27 ENCOUNTER — Encounter (HOSPITAL_BASED_OUTPATIENT_CLINIC_OR_DEPARTMENT_OTHER): Payer: Medicare Other | Attending: Internal Medicine | Admitting: Internal Medicine

## 2019-10-27 ENCOUNTER — Ambulatory Visit: Payer: Self-pay | Admitting: *Deleted

## 2019-10-27 DIAGNOSIS — E1140 Type 2 diabetes mellitus with diabetic neuropathy, unspecified: Secondary | ICD-10-CM | POA: Diagnosis not present

## 2019-10-27 DIAGNOSIS — Z7901 Long term (current) use of anticoagulants: Secondary | ICD-10-CM | POA: Insufficient documentation

## 2019-10-27 DIAGNOSIS — Z8572 Personal history of non-Hodgkin lymphomas: Secondary | ICD-10-CM | POA: Diagnosis not present

## 2019-10-27 DIAGNOSIS — C7951 Secondary malignant neoplasm of bone: Secondary | ICD-10-CM | POA: Diagnosis not present

## 2019-10-27 DIAGNOSIS — I4891 Unspecified atrial fibrillation: Secondary | ICD-10-CM | POA: Diagnosis not present

## 2019-10-27 DIAGNOSIS — E11621 Type 2 diabetes mellitus with foot ulcer: Secondary | ICD-10-CM | POA: Diagnosis not present

## 2019-10-27 DIAGNOSIS — I10 Essential (primary) hypertension: Secondary | ICD-10-CM | POA: Diagnosis not present

## 2019-10-27 DIAGNOSIS — C61 Malignant neoplasm of prostate: Secondary | ICD-10-CM | POA: Insufficient documentation

## 2019-10-27 DIAGNOSIS — L97514 Non-pressure chronic ulcer of other part of right foot with necrosis of bone: Secondary | ICD-10-CM | POA: Diagnosis not present

## 2019-10-30 ENCOUNTER — Ambulatory Visit: Payer: Self-pay | Admitting: *Deleted

## 2019-10-30 DIAGNOSIS — C8581 Other specified types of non-Hodgkin lymphoma, lymph nodes of head, face, and neck: Secondary | ICD-10-CM | POA: Diagnosis not present

## 2019-10-30 DIAGNOSIS — L97514 Non-pressure chronic ulcer of other part of right foot with necrosis of bone: Secondary | ICD-10-CM | POA: Diagnosis not present

## 2019-10-30 DIAGNOSIS — E11621 Type 2 diabetes mellitus with foot ulcer: Secondary | ICD-10-CM | POA: Diagnosis not present

## 2019-10-30 DIAGNOSIS — E1142 Type 2 diabetes mellitus with diabetic polyneuropathy: Secondary | ICD-10-CM | POA: Diagnosis not present

## 2019-10-30 DIAGNOSIS — L97522 Non-pressure chronic ulcer of other part of left foot with fat layer exposed: Secondary | ICD-10-CM | POA: Diagnosis not present

## 2019-10-30 DIAGNOSIS — C61 Malignant neoplasm of prostate: Secondary | ICD-10-CM | POA: Diagnosis not present

## 2019-10-31 ENCOUNTER — Other Ambulatory Visit: Payer: Self-pay

## 2019-10-31 ENCOUNTER — Encounter (HOSPITAL_COMMUNITY): Payer: Medicare Other

## 2019-10-31 ENCOUNTER — Ambulatory Visit (INDEPENDENT_AMBULATORY_CARE_PROVIDER_SITE_OTHER): Payer: Medicare Other | Admitting: Vascular Surgery

## 2019-10-31 ENCOUNTER — Encounter: Payer: Self-pay | Admitting: Vascular Surgery

## 2019-10-31 VITALS — BP 177/86 | HR 72

## 2019-10-31 DIAGNOSIS — L97519 Non-pressure chronic ulcer of other part of right foot with unspecified severity: Secondary | ICD-10-CM | POA: Diagnosis not present

## 2019-10-31 DIAGNOSIS — C7951 Secondary malignant neoplasm of bone: Secondary | ICD-10-CM | POA: Diagnosis not present

## 2019-10-31 DIAGNOSIS — Z515 Encounter for palliative care: Secondary | ICD-10-CM | POA: Diagnosis not present

## 2019-10-31 DIAGNOSIS — C61 Malignant neoplasm of prostate: Secondary | ICD-10-CM | POA: Diagnosis not present

## 2019-10-31 DIAGNOSIS — I739 Peripheral vascular disease, unspecified: Secondary | ICD-10-CM

## 2019-10-31 MED ORDER — TRAMADOL HCL 50 MG PO TABS: 50.0000 mg | ORAL_TABLET | Freq: Four times a day (QID) | ORAL | 0 refills | Status: DC | PRN

## 2019-10-31 NOTE — Progress Notes (Addendum)
[name], [name] ([id number]) Visit Report for 10/27/2019 Arrival Information Details Patient Name: Date of Service: [name] 10/27/2019 10:45 A M Medical Record Number: [id number] Patient Account Number: [id number] Date of Birth/Sex: Treating RN: 1923-11-11 (86 y.o. [name] Primary Care Wilda Wetherell: Sherrie Mustache Other Clinician: Minerva Fester Referring Chael Urenda: Treating Reubin Bushnell/Extender: Deloria Lair in Treatment: 8 Visit Information History Since Last Visit All ordered tests and consults were completed: No Patient Arrived: Wheel Chair Added or deleted any medications: No Arrival Time: 11:00 Any new allergies or adverse reactions: No Accompanied By: wife Had a fall or experienced change in No Transfer Assistance: None activities of daily living that may affect Patient Requires Transmission-Based Precautions: No risk of falls: Patient Has Alerts: Yes Signs or symptoms of abuse/neglect since last visito No Patient Alerts: R ABI non compressible Hospitalized since last visit: No Implantable device outside of the clinic excluding No cellular tissue based products placed in the center since last visit: Pain Present Now: No Electronic Signature(s) Signed: 10/30/2019 9:27:12 AM By: Minerva Fester Entered By: Minerva Fester on 10/27/2019 11:35:25 -------------------------------------------------------------------------------- Clinic Level of Care Assessment Details Patient Name: Date of Service: [name] 10/27/2019 10:45 A M Medical Record Number: [id number] Patient Account Number: [id number] Date of Birth/Sex: Treating RN: Sep 22, 1923 (86 y.o. [name] Primary Care Wasil Wolke: Sherrie Mustache Other Clinician: Referring Feather Berrie: Treating Tamarick Kovalcik/Extender: Deloria Lair in Treatment: 13 Clinic Level of Care Assessment Items TOOL 4 Quantity Score X- 1 0 Use when only an EandM is performed  on FOLLOW-UP visit ASSESSMENTS - Nursing Assessment / Reassessment X- 1 10 Reassessment of Co-morbidities (includes updates in patient status) X- 1 5 Reassessment of Adherence to Treatment Plan ASSESSMENTS - Wound and Skin A ssessment / Reassessment []  - 0 Simple Wound Assessment / Reassessment - one wound X- 2 5 Complex Wound Assessment / Reassessment - multiple wounds []  - 0 Dermatologic / Skin Assessment (not related to wound area) ASSESSMENTS - Focused Assessment X- 1 5 Circumferential Edema Measurements - multi extremities []  - 0 Nutritional Assessment / Counseling / Intervention []  - 0 Lower Extremity Assessment (monofilament, tuning fork, pulses) []  - 0 Peripheral Arterial Disease Assessment (using hand held doppler) ASSESSMENTS - Ostomy and/or Continence Assessment and Care []  - 0 Incontinence Assessment and Management []  - 0 Ostomy Care Assessment and Management (repouching, etc.) PROCESS - Coordination of Care X - Simple Patient / Family Education for ongoing care 1 15 []  - 0 Complex (extensive) Patient / Family Education for ongoing care X- 1 10 Staff obtains Programmer, systems, Records, T Results / Process Orders est X- 1 10 Staff telephones HHA, Nursing Homes / Clarify orders / etc []  - 0 Routine Transfer to another Facility (non-emergent condition) []  - 0 Routine Hospital Admission (non-emergent condition) []  - 0 New Admissions / Biomedical engineer / Ordering NPWT Apligraf, etc. , []  - 0 Emergency Hospital Admission (emergent condition) X- 1 10 Simple Discharge Coordination []  - 0 Complex (extensive) Discharge Coordination PROCESS - Special Needs []  - 0 Pediatric / Minor Patient Management []  - 0 Isolation Patient Management []  - 0 Hearing / Language / Visual special needs []  - 0 Assessment of Community assistance (transportation, D/C planning, etc.) []  - 0 Additional assistance / Altered mentation []  - 0 Support Surface(s) Assessment (bed,  cushion, seat, etc.) INTERVENTIONS - Wound Cleansing / Measurement []  - 0 Simple Wound Cleansing - one wound X- 2 5 Complex Wound Cleansing - multiple  wounds X- 1 5 Wound Imaging (photographs - any number of wounds) []  - 0 Wound Tracing (instead of photographs) []  - 0 Simple Wound Measurement - one wound X- 2 5 Complex Wound Measurement - multiple wounds INTERVENTIONS - Wound Dressings X - Small Wound Dressing one or multiple wounds 2 10 []  - 0 Medium Wound Dressing one or multiple wounds []  - 0 Large Wound Dressing one or multiple wounds X- 1 5 Application of Medications - topical []  - 0 Application of Medications - injection INTERVENTIONS - Miscellaneous []  - 0 External ear exam []  - 0 Specimen Collection (cultures, biopsies, blood, body fluids, etc.) []  - 0 Specimen(s) / Culture(s) sent or taken to Lab for analysis []  - 0 Patient Transfer (multiple staff / Civil Service fast streamer / Similar devices) []  - 0 Simple Staple / Suture removal (25 or less) []  - 0 Complex Staple / Suture removal (26 or more) []  - 0 Hypo / Hyperglycemic Management (close monitor of Blood Glucose) []  - 0 Ankle / Brachial Index (ABI) - do not check if billed separately X- 1 5 Vital Signs Has the patient been seen at the hospital within the last three years: Yes Total Score: 130 Level Of Care: New/Established - Level 4 Electronic Signature(s) Signed: 10/27/2019 5:57:27 PM By: [name] Entered By: [name] on 10/27/2019 12:22:55 -------------------------------------------------------------------------------- Encounter Discharge Information Details Patient Name: Date of Service: [name] [name] Fuchs E. 10/27/2019 10:45 A M Medical Record Number: [id number] Patient Account Number: [id number] Date of Birth/Sex: Treating RN: September 21, 1923 (86 y.o. [name] Primary Care Nesbit Michon: Sherrie Mustache Other Clinician: Referring Devario Bucklew: Treating Dhani Dannemiller/Extender: Deloria Lair in Treatment: 13 Encounter Discharge Information Items Discharge Condition: Stable Ambulatory Status: Wheelchair Discharge Destination: Home Transportation: Private Auto Accompanied By: family member Schedule Follow-up Appointment: Yes Clinical Summary of Care: Patient Declined Electronic Signature(s) Signed: 10/27/2019 5:57:27 PM By: [name] Entered By: [name] on 10/27/2019 12:39:46 -------------------------------------------------------------------------------- Lower Extremity Assessment Details Patient Name: Date of Service: [name] Schwartz E. 10/27/2019 10:45 A M Medical Record Number: [id number] Patient Account Number: [id number] Date of Birth/Sex: Treating RN: 10-Jun-1923 (86 y.o. [name] Primary Care Oleda Borski: Sherrie Mustache Other Clinician: Referring Yifan Auker: Treating Keontae Levingston/Extender: Deloria Lair in Treatment: 13 Edema Assessment Assessed: Shirlyn Goltz: No] Patrice Paradise: No] Edema: [Left: N] [Right: o] Calf Left: Right: Point of Measurement: cm From Medial Instep cm 29.5 cm Ankle Left: Right: Point of Measurement: cm From Medial Instep cm 23.5 cm Vascular Assessment Pulses: Dorsalis Pedis Palpable: [Right:Yes] Electronic Signature(s) Signed: 10/27/2019 5:57:27 PM By: [name] Signed: 10/30/2019 9:27:12 AM By: Minerva Fester Entered By: Minerva Fester on 10/27/2019 11:43:39 -------------------------------------------------------------------------------- Multi Wound Chart Details Patient Name: Date of Service: [name] [name] Fuchs E. 10/27/2019 10:45 A M Medical Record Number: [id number] Patient Account Number: [id number] Date of Birth/Sex: Treating RN: 10/14/1923 (86 y.o. [name] Primary Care Slevin Gunby: Sherrie Mustache Other Clinician: Referring Sherian Valenza: Treating Cloey Sferrazza/Extender: Deloria Lair in Treatment: 13 Vital Signs Height(in): 20 Pulse(bpm):  85 Weight(lbs): 140 Blood Pressure(mmHg): 166/82 Body Mass Index(BMI): 20 Temperature(F): 97.7 Respiratory Rate(breaths/min): 16 Photos: [2:No Photos Right T Great oe] [3:No Photos Right Calcaneus] [N/A:N/A N/A] Wound Location: [2:Gradually Appeared] [3:Gradually Appeared] [N/A:N/A] Wounding Event: [2:Diabetic Wound/Ulcer of the Lower] [3:Diabetic Wound/Ulcer of the Lower] [N/A:N/A] Primary Etiology: [2:Extremity N/A] [3:Extremity Arterial Insufficiency Ulcer] [N/A:N/A] Secondary Etiology: [2:Cataracts, Arrhythmia, Hypertension,] [3:Cataracts, Arrhythmia, Hypertension,] [N/A:N/A] Comorbid History: [2:Peripheral Arterial Disease, Type II Diabetes, Osteomyelitis, Dementia, Neuropathy, Received Chemotherapy  06/15/2019] [3:Peripheral Arterial Disease, Type II Diabetes, Osteomyelitis, Dementia, Neuropathy, Received Chemotherapy  09/21/2019] [N/A:N/A] Date Acquired: [2:13] [3:2] [N/A:N/A] Weeks of Treatment: [2:Open] [3:Open] [N/A:N/A] Wound Status: [2:1x0.6x0.2] [3:7x7x0.1] [N/A:N/A] Measurements L x W x D (cm) [2:0.471] [3:38.485] [N/A:N/A] A (cm) : rea [2:0.094] [3:3.848] [N/A:N/A] Volume (cm) : [2:-401.10%] [3:10.30%] [N/A:N/A] % Reduction in A rea: [2:-235.70%] [3:10.30%] [N/A:N/A] % Reduction in Volume: [2:Grade 2] [3:Unable to visualize wound bed] [N/A:N/A] Classification: [2:None Present] [3:Medium] [N/A:N/A] Exudate A mount: [2:N/A] [3:Serous] [N/A:N/A] Exudate Type: [2:N/A] [3:amber] [N/A:N/A] Exudate Color: [2:Distinct, outline attached] [3:Flat and Intact] [N/A:N/A] Wound Margin: [2:None Present (0%)] [3:Large (67-100%)] [N/A:N/A] Granulation A mount: [2:N/A] [3:Pink] [N/A:N/A] Granulation Quality: [2:Large (67-100%)] [3:None Present (0%)] [N/A:N/A] Necrotic A mount: [2:Eschar] [3:N/A] [N/A:N/A] Necrotic Tissue: [2:Fascia: No] [3:Fat Layer (Subcutaneous Tissue)] [N/A:N/A] Exposed Structures: [2:Fat Layer (Subcutaneous Tissue) Exposed: No Tendon: No Muscle: No Joint: No Bone:  No Small (1-33%)] [3:Exposed: Yes Fascia: No Tendon: No Muscle: No Joint: No Bone: No Medium (34-66%)] [N/A:N/A] Treatment Notes Wound #2 (Right Toe Great) 1. Cleanse With Wound Cleanser 2. Periwound Care Skin Prep 3. Primary Dressing Applied Calcium Alginate Ag 4. Secondary Dressing Dry Gauze Roll Gauze 5. Secured With Tape Notes netting Wound #3 (Right Calcaneus) 1. Cleanse With Wound Cleanser 3. Primary Dressing Applied Calcium Alginate Ag Other primary dressing (specifiy in notes) 4. Secondary Dressing Roll Gauze Heel Cup 5. Secured With Tape Notes paint with betadine to eschar sites. netting Electronic Signature(s) Signed: 10/27/2019 5:57:27 PM By: [name] Signed: 10/31/2019 8:12:39 AM By: Linton Ham MD Entered By: Linton Ham on 10/27/2019 12:53:03 -------------------------------------------------------------------------------- Multi-Disciplinary Care Plan Details Patient Name: Date of Service: [name] [name] 10/27/2019 10:45 A M Medical Record Number: [id number] Patient Account Number: [id number] Date of Birth/Sex: Treating RN: 04-Oct-1923 (86 y.o. [name] Primary Care Daishia Fetterly: Sherrie Mustache Other Clinician: Referring Anjana Cheek: Treating Ericha Whittingham/Extender: Deloria Lair in Treatment: 13 Active Inactive Wound/Skin Impairment Nursing Diagnoses: Knowledge deficit related to ulceration/compromised skin integrity Goals: Patient/caregiver will verbalize understanding of skin care regimen Date Initiated: 07/25/2019 Target Resolution Date: 11/10/2019 Goal Status: Active Ulcer/skin breakdown will have a volume reduction of 30% by week 4 Date Initiated: 07/25/2019 Date Inactivated: 08/28/2019 Target Resolution Date: 08/25/2019 Goal Status: Unmet Unmet Reason: Osteomyelitis Interventions: Assess patient/caregiver ability to obtain necessary supplies Assess patient/caregiver ability to perform ulcer/skin care  regimen upon admission and as needed Assess ulceration(s) every visit Notes: Electronic Signature(s) Signed: 10/27/2019 5:57:27 PM By: [name] Entered By: [name] on 10/27/2019 12:21:57 -------------------------------------------------------------------------------- Pain Assessment Details Patient Name: Date of Service: [name] Schwartz E. 10/27/2019 10:45 A M Medical Record Number: [id number] Patient Account Number: [id number] Date of Birth/Sex: Treating RN: 12-24-1923 (86 y.o. [name] Primary Care Mitul Hallowell: Sherrie Mustache Other Clinician: Referring Neomi Laidler: Treating Alashia Brownfield/Extender: Deloria Lair in Treatment: 13 Active Problems Location of Pain Severity and Description of Pain Patient Has Paino Yes Site Locations Pain Location: Generalized Pain, Pain in Ulcers Duration of the Pain. Constant / Intermittento Constant Rate the pain. Current Pain Level: 4 Worst Pain Level: 10 Least Pain Level: 0 Pain Management and Medication Current Pain Management: Electronic Signature(s) Signed: 10/27/2019 5:57:27 PM By: [name] Signed: 10/30/2019 9:27:12 AM By: Minerva Fester Entered By: Minerva Fester on 10/27/2019 11:37:32 -------------------------------------------------------------------------------- Patient/Caregiver Education Details Patient Name: Date of Service: [name] 7/9/2021andnbsp10:45 A M Medical Record Number: [id number] Patient Account Number: [id number] Date of Birth/Gender: Treating RN: Sep 26, 1923 (86 y.o. Gay Filler, Larene Beach  Primary Care Physician: Sherrie Mustache Other Clinician: Referring Physician: Treating Physician/Extender: Deloria Lair in Treatment: 13 Education Assessment Education Provided To: Patient Education Topics Provided Wound/Skin Impairment: Handouts: Caring for Your Ulcer Methods: Explain/Verbal Responses: State content  correctly Electronic Signature(s) Signed: 10/27/2019 5:57:27 PM By: [name] Entered By: [name] on 10/27/2019 12:22:11 -------------------------------------------------------------------------------- Wound Assessment Details Patient Name: Date of Service: [name] Schwartz E. 10/27/2019 10:45 A M Medical Record Number: [id number] Patient Account Number: [id number] Date of Birth/Sex: Treating RN: 1924/02/15 (86 y.o. [name] Primary Care Lauri Purdum: Sherrie Mustache Other Clinician: Referring Tynisa Vohs: Treating Yandell Mcjunkins/Extender: Deloria Lair in Treatment: 13 Wound Status Wound Number: 2 Primary Diabetic Wound/Ulcer of the Lower Extremity Etiology: Wound Location: Right T Great oe Wound Open Wounding Event: Gradually Appeared Status: Date Acquired: 06/15/2019 Comorbid Cataracts, Arrhythmia, Hypertension, Peripheral Arterial Disease, Weeks Of Treatment: 13 History: Type II Diabetes, Osteomyelitis, Dementia, Neuropathy, Received Clustered Wound: No Chemotherapy Photos Photo Uploaded By: Mikeal Hawthorne on 10/30/2019 08:45:56 Wound Measurements Length: (cm) 1 Width: (cm) 0.6 Depth: (cm) 0.2 Area: (cm) 0.471 Volume: (cm) 0.094 % Reduction in Area: -401.1% % Reduction in Volume: -235.7% Epithelialization: Small (1-33%) Tunneling: No Undermining: No Wound Description Classification: Grade 2 Wound Margin: Distinct, outline attached Exudate Amount: None Present Foul Odor After Cleansing: No Slough/Fibrino No Wound Bed Granulation Amount: None Present (0%) Exposed Structure Necrotic Amount: Large (67-100%) Fascia Exposed: No Necrotic Quality: Eschar Fat Layer (Subcutaneous Tissue) Exposed: No Tendon Exposed: No Muscle Exposed: No Joint Exposed: No Bone Exposed: No Treatment Notes Wound #2 (Right Toe Great) 1. Cleanse With Wound Cleanser 2. Periwound Care Skin Prep 3. Primary Dressing Applied Calcium Alginate  Ag 4. Secondary Dressing Dry Gauze Roll Gauze 5. Secured With Tape Notes Horticulturist, commercial) Signed: 10/27/2019 5:57:27 PM By: [name] Signed: 10/30/2019 9:27:12 AM By: Minerva Fester Entered By: Minerva Fester on 10/27/2019 11:57:43 -------------------------------------------------------------------------------- Wound Assessment Details Patient Name: Date of Service: [name] [name] Fuchs E. 10/27/2019 10:45 A M Medical Record Number: [id number] Patient Account Number: [id number] Date of Birth/Sex: Treating RN: Mar 03, 1924 (86 y.o. [name] Primary Care Terrie Grajales: Sherrie Mustache Other Clinician: Referring Shahid Flori: Treating Daily Doe/Extender: Deloria Lair in Treatment: 13 Wound Status Wound Number: 3 Primary Diabetic Wound/Ulcer of the Lower Extremity Etiology: Wound Location: Right Calcaneus Secondary Arterial Insufficiency Ulcer Wounding Event: Gradually Appeared Etiology: Date Acquired: 09/21/2019 Wound Open Weeks Of Treatment: 2 Status: Clustered Wound: No Comorbid Cataracts, Arrhythmia, Hypertension, Peripheral Arterial Disease, History: Type II Diabetes, Osteomyelitis, Dementia, Neuropathy, Received Chemotherapy Photos Photo Uploaded By: Mikeal Hawthorne on 10/30/2019 08:45:37 Wound Measurements Length: (cm) 7 Width: (cm) 7 Depth: (cm) 0.1 Area: (cm) 38.485 Volume: (cm) 3.848 % Reduction in Area: 10.3% % Reduction in Volume: 10.3% Epithelialization: Medium (34-66%) Tunneling: No Undermining: No Wound Description Classification: Unable to visualize wound bed Wound Margin: Flat and Intact Exudate Amount: Medium Exudate Type: Serous Exudate Color: amber Foul Odor After Cleansing: No Slough/Fibrino Yes Wound Bed Granulation Amount: Large (67-100%) Exposed Structure Granulation Quality: Pink Fascia Exposed: No Necrotic Amount: None Present (0%) Fat Layer (Subcutaneous Tissue) Exposed: Yes Tendon Exposed:  No Muscle Exposed: No Joint Exposed: No Bone Exposed: No Treatment Notes Wound #3 (Right Calcaneus) 1. Cleanse With Wound Cleanser 3. Primary Dressing Applied Calcium Alginate Ag Other primary dressing (specifiy in notes) 4. Secondary Dressing Roll Gauze Heel Cup 5. Secured With Tape Notes paint with betadine to eschar sites. netting Electronic Signature(s) Signed: 10/27/2019 5:57:27 PM By: Verita Schneiders,  Larene Beach Signed: 10/30/2019 9:27:12 AM By: Minerva Fester Entered By: Minerva Fester on 10/27/2019 11:58:05 -------------------------------------------------------------------------------- Vitals Details Patient Name: Date of Service: [name] [name] Fuchs E. 10/27/2019 10:45 A M Medical Record Number: [id number] Patient Account Number: [id number] Date of Birth/Sex: Treating RN: 16-Oct-1923 (86 y.o. [name] Primary Care [place] Mullett: Sherrie Mustache Other Clinician: Minerva Fester Referring Cambren Helm: Treating Opaline Reyburn/Extender: Deloria Lair in Treatment: 13 Vital Signs Time Taken: 11:35 Temperature (F): 97.7 Height (in): 70 Pulse (bpm): 64 Weight (lbs): 140 Respiratory Rate (breaths/min): 16 Body Mass Index (BMI): 20.1 Blood Pressure (mmHg): 166/82 Reference Range: 80 - 120 mg / dl Electronic Signature(s) Signed: 10/30/2019 9:27:12 AM By: Minerva Fester Entered By: Minerva Fester on 10/27/2019 11:37:11

## 2019-10-31 NOTE — Progress Notes (Signed)
Patient name: [name] MRN: [id number] DOB: 1923/05/04 Sex: male  REASON FOR VISIT: Follow-up for right heel wound  HPI: [name] is a 86 y.o. male with history of diabetes, atrial fibrillation, hypertension, hyperlipidemia, PAD that presents for ongoing follow-up of right foot tissue loss.  He recently underwent right posterior tibial intervention for critical limb ischemia with tissue loss (great toe ulcer) on 09/20/19.  Postop course was complicated by syncopal event while he was having a bowel movement in recovery and he was admitted for observation.  He then had ongoing issues with orthostasis and this was felt to be possibly related to adrenal insufficiency but as treated with steroids and appreciate the hospitalist input.    He is now going to wound clinic with the help of Dr. Dellia Nims and putting Betadine on the heel and additional medicated creams.  The tramadol appears to be controlling his pain at night with tylenol during the day.  Limited with his mobility due to his heel wound.  Past Medical History:  Diagnosis Date  . Acute bronchitis   . Acute lower UTI 11/25/2017  . Amputation of toe of left foot ([place]) 07/30/2017, 08/27/2017    Great toe 07/30/2017 Second toe 08/27/2017   . Arthritis   . Aspiration pneumonia ([place]) 01/31/2018  . Atrial fibrillation ([place])    Dr. Harl Bowie- [facility] follows saw 11'14  . Bladder stones    tx. with oral meds and antibiotics, now surgery planned  . Bone metastases ([place]) 08/21/2015  . Cataracts, both eyes    surgery planned May 2015  . Community acquired pneumonia 04/16/2018  . Cystoid macular edema 04/21/2015    Right eye 04/21/2015   . Dehydration 04/16/2018  . Dementia without behavioral disturbance ([place]) 04/16/2018  . Diabetes mellitus without complication ([place])    Type II  . Diarrhea 11/25/2017  . Dyspnea    with activity  . Dysrhythmia    Afib  . Family history of breast cancer   . Family history of colon cancer   .  Genetic testing 02/09/2017  . GERD (gastroesophageal reflux disease)   . 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 Margot Chimes, thus far without recurrence.   Marland Kitchen History of MRI of lumbar spine 03/05/2014  . Hypertension   . Hypertrophy of prostate with urinary obstruction   . Hypomagnesemia 03/16/2017  . Metabolic encephalopathy 56/43/3295  . Metastatic adenocarcinoma to prostate ([place]) 09/08/2013  . 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 NHL (non-Hodgkin's lymphoma) ([place]) 2005   Diffuse large B-cell lymphoma, clinically stage IIIA, CD20 positive, status post cervical lymph node biopsy 08/03/2003 on the left. PET scan was also positive in the spleen and small bowel region, but bone marrow aspiration and biopsy were negative. So he essentially had stage IIIAs. He received R-CHOP x6 cycles with CR established by PET scan criteria on 11/23/2003 with no evidence for relapse th  . Numbness 02/19/2017   Per [facility] new patient packet  . Osteomyelitis ([place]) 06/29/2017   toe of left foot   . PAD (peripheral artery disease) ([place]) 08/27/2017  . Polyneuropathy 02/19/2017  . Postural dizziness with presyncope 02/19/2017  . S/P TURP 08/10/2013  . Sepsis ([place]) 07/02/2018  . Skin cancer    Basal cell- face,head, neck,  arms,  legs, Back  . Urinary frequency 02/19/2017    Past Surgical History:  Procedure Laterality Date  . ABDOMINAL AORTOGRAM W/LOWER EXTREMITY N/A 09/20/2019   Procedure: ABDOMINAL AORTOGRAM W/LOWER EXTREMITY;  Surgeon: Marty Heck, MD;  Location: [facility];  Service: Cardiovascular;  Laterality: N/A;  . AMPUTATION Left 06/29/2017   Procedure: AMPUTATION LEFT GREAT TOE;  Surgeon: [name], MD;  Location: [facility];  Service:  Vascular;  Laterality: Left;  . AMPUTATION Left 08/27/2017   Procedure: AMPUTATION Left SECOND TOE;  Surgeon: [name], MD;  Location: [facility];  Service: Vascular;  Laterality: Left;  . CATARACT EXTRACTION Bilateral 2015  . CHOLECYSTECTOMY    . COLON RESECTION     small bowel  . CYSTOSCOPY WITH LITHOLAPAXY N/A 08/10/2013   Procedure: CYSTOSCOPY WITH LITHOLAPAXY WITH [name];  Surgeon: [name], MD;  Location: [facility];  Service: Urology;  Laterality: N/A;  . ESOPHAGEAL DILATION N/A 02/27/2016   Procedure: ESOPHAGEAL DILATION;  Surgeon: [name], MD;  [facility];  Service: Endoscopy;  Laterality: N/A;  . ESOPHAGEAL DILATION N/A 07/07/2018   Procedure: ESOPHAGEAL DILATION;  Surgeon: [name], MD;  [facility];  Service: Endoscopy;  Laterality: N/A;  . ESOPHAGOGASTRODUODENOSCOPY N/A 02/27/2016   Procedure: ESOPHAGOGASTRODUODENOSCOPY (EGD);  Surgeon: [name], MD;  [facility];  Service: Endoscopy;  Laterality: N/A;  12:00  . ESOPHAGOGASTRODUODENOSCOPY N/A 07/07/2018   Procedure: ESOPHAGOGASTRODUODENOSCOPY (EGD);  Surgeon: [name], MD;  [facility];  Service: Endoscopy;  Laterality: N/A;  . INCISION AND DRAINAGE PERIRECTAL ABSCESS    . LOWER EXTREMITY ANGIOGRAPHY N/A 06/25/2017   Procedure: LOWER EXTREMITY ANGIOGRAPHY;  Surgeon: [name], MD;  Location: [facility];  Service: Cardiovascular;  Laterality: N/A;  . LYMPH NODE DISSECTION Left    '05-neck  . PERIPHERAL VASCULAR ATHERECTOMY Right 09/20/2019   Procedure: PERIPHERAL VASCULAR ATHERECTOMY;  Surgeon: Marty Heck, MD;  Location: [facility];  Service: Cardiovascular;  Laterality: Right;  Posterior tibial  . PORT-A-CATH REMOVAL    . PORTACATH PLACEMENT     insertion and removal -last chemotherapy 10 yrs ago  . TRANSURETHRAL RESECTION OF PROSTATE N/A 08/10/2013   Procedure: TRANSURETHRAL RESECTION OF THE PROSTATE WITH  GYRUS INSTRUMENTS;  Surgeon: [name], MD;  Location[facility];  Service: Urology;  Laterality: N/A;    Family History  Problem Relation Age of Onset  . Heart attack Mother        died in her 35s  . Other Father 58       pneumonia - died  . Breast cancer Sister        dx in her 48s-60s  . Colon cancer Brother        dx in his 25s  . Cancer Sister        TAH/BSO due to "male cancer"  . Breast cancer Daughter 45  . Breast cancer Daughter 50       DCIS - ATM VUS on [employer] 83 gene panel in 2018  . Breast cancer Daughter 66       DCIS - Negative on [employer] MyRisk 25 gene apnel in 2015  . Thyroid cancer Daughter 59       Medullary and Papillary  . Thyroid nodules Grandchild     SOCIAL HISTORY: Social History   Tobacco Use  . Smoking status: Never Smoker  . Smokeless tobacco: Never Used  Substance Use Topics  .  Alcohol use: No    Allergies  Allergen Reactions  . Tape Other (See Comments)    SKIN IS VERY THIN AND WILL TEAR AND BRUISE VERY EASILY!!!!  . Augmentin [Amoxicillin-Pot Clavulanate] Rash and Other (See Comments)    Redness of skin    Current Outpatient Medications  Medication Sig Dispense Refill  . abiraterone acetate (ZYTIGA) 250 MG tablet Take 1 tablet (250 mg total) by mouth daily before breakfast. Take on an empty stomach 1 hour before or 2 hours after a meal 120 tablet 0  . acetaminophen (TYLENOL) 500 MG tablet Take 1 tablet (500 mg total) by mouth 3 (three) times daily. 30 tablet 0  . apixaban (ELIQUIS) 5 MG TABS tablet Take 5 mg by mouth 2 (two) times daily.    . blood glucose meter kit and supplies Dispense based on patient and insurance preference. Use four times daily as directed. (FOR ICD-10 E10.9, E11.9). 1 each 0  . Calcium Polycarbophil (FIBER-CAPS PO) Take 2-3 capsules by mouth See admin instructions. Take 3 capsules by mouth in the morning & take 2 capsules by mouth in the evening.    . cephALEXin (KEFLEX) 250 MG capsule Take 1 capsule (250  mg total) by mouth 2 (two) times daily. 28 capsule 0  . Cholecalciferol (VITAMIN D3) 1000 units CAPS Take 1,000 Units by mouth daily.     . clopidogrel (PLAVIX) 75 MG tablet Take 1 tablet (75 mg total) by mouth daily. 30 tablet 11  . clotrimazole-betamethasone (LOTRISONE) cream Apply 1 application topically 2 (two) times daily as needed (skin irriation.).     Marland Kitchen Cranberry 500 MG TABS Take 500 mg by mouth daily.     Marland Kitchen denosumab (XGEVA) 120 MG/1.7ML SOLN injection Inject 120 mg into the skin every 30 (thirty) days.    . diclofenac Sodium (VOLTAREN) 1 % GEL Apply 2 g topically 4 (four) times daily. 150 g 0  . diltiazem (CARDIZEM) 60 MG tablet Take 60 mg by mouth 2 (two) times daily.    Marland Kitchen donepezil (ARICEPT) 10 MG tablet Take 1 tablet by mouth once daily 90 tablet 0  . feeding supplement, ENSURE ENLIVE, (ENSURE ENLIVE) LIQD Take 237 mLs by mouth daily. CHOCOLATE     . fluticasone (FLONASE) 50 MCG/ACT nasal spray Place 2 sprays into both nostrils daily as needed (allergies.).     Marland Kitchen furosemide (LASIX) 20 MG tablet Take 20 mg by mouth daily as needed (retention/swelling.).     Marland Kitchen glucose blood (EASYMAX TEST) test strip Use as instructed 100 each 12  . Lancets 28G MISC 1 kit by Does not apply route every morning. 100 each 1  . Leuprolide Acetate, 6 Month, (LUPRON DEPOT, 2-MONTH,) 45 MG injection Inject 45 mg into the muscle every 6 (six) months.    . metFORMIN (GLUCOPHAGE) 850 MG tablet Take 850 mg by mouth 2 (two) times daily with a meal.     . mirtazapine (REMERON) 15 MG tablet Take 1 tablet (15 mg total) by mouth at bedtime. 30 tablet 0  . Multiple Vitamin (MULTIVITAMIN WITH MINERALS) TABS tablet Take 1 tablet by mouth daily.     . nitroGLYCERIN (NITRODUR - DOSED IN MG/24 HR) 0.2 mg/hr patch Place 1 patch (0.2 mg total) onto the skin daily. 30 patch 12  . predniSONE (DELTASONE) 2.5 MG tablet Take 2.5 mg by mouth 2 (two) times daily with a meal.    . Probiotic Product (PROBIOTIC DAILY PO) Take 1 tablet  by mouth daily. Every morning    .  traMADol (ULTRAM) 50 MG tablet Take 1 tablet (50 mg total) by mouth every 6 (six) hours as needed for moderate pain. 30 tablet 0  . trimethoprim (TRIMPEX) 100 MG tablet Take 1 tablet (100 mg total) by mouth at bedtime. 90 tablet 3   No current facility-administered medications for this visit.    REVIEW OF SYSTEMS:  _0  denotes positive finding, _1  denotes negative finding Cardiac  Comments:  Chest pain or chest pressure:    Shortness of breath upon exertion:    Short of breath when lying flat:    Irregular heart rhythm:        Vascular    Pain in calf, thigh, or hip brought on by ambulation:    Pain in feet at night that wakes you up from your sleep:     Blood clot in your veins:    Leg swelling:         Pulmonary    Oxygen at home:    Productive cough:     Wheezing:         Neurologic    Sudden weakness in arms or legs:     Sudden numbness in arms or legs:     Sudden onset of difficulty speaking or slurred speech:    Temporary loss of vision in one eye:     Problems with dizziness:         Gastrointestinal    Blood in stool:     Vomited blood:         Genitourinary    Burning when urinating:     Blood in urine:        Psychiatric    Major depression:         Hematologic    Bleeding problems:    Problems with blood clotting too easily:        Skin    Rashes or ulcers:        Constitutional    Fever or chills:      PHYSICAL EXAM: Vitals:   10/31/19 1400  BP: (!) 177/86  Pulse: 72    GENERAL: The patient is a well-nourished male, in no acute distress. The vital signs are documented above. CARDIAC: There is a regular rate and rhythm.  VASCULAR:  Right heel pictured below Dry ulcer right great toe Right DP brisk by doppler, PT signal as well             DATA:   None  Assessment/Plan:  86 year old male who presented with critical limb ischemia of the right lower extremity with a nonhealing wound to  the great toe.  Ultimately he had severe tibial disease and dominant runoff was through the anterior tibial artery that was patent down to the ankle but had very poor outflow due to small vessel disease.  Ultimately underwent posterior tibial intervention for severely calcified artery with chronic total occlusion distally and likely had some atheroembolism to the heel.   Has dry eschar on the heel as pictured above and again appreciate the help of wound care.  No active signs of infection at this time.  Pain is still controlled with tramadol just before bed and Tylenol during the day.  I did send a new prescription to his pharmacy.  It is fairly difficult for his daughter to get him into our clinic so we will stretch out his follow-up to 1 month and she knows concerning signs and symptoms to look for and will notify our office if there  are concerns.  We again discussed that at some point if this gets infected or he has uncontrolled pain we will have to reevaluate the idea of amputation.  The right great toe wound remains dry as well.  Marty Heck, MD [facility] Office: [phone]

## 2019-10-31 NOTE — Progress Notes (Addendum)
MALAKI, KOURY ([id number]) Visit Report for 10/27/2019 HPI Details Patient Name: Date of Service: [name] 10/27/2019 10:45 A M Medical Record Number: [id number] Patient Account Number: [id number] Date of Birth/Sex: Treating RN: 1923/10/06 (86 y.o. [name] Primary Care Provider: Sherrie Mustache Other Clinician: Referring Provider: Treating Provider/Extender: Deloria Lair in Treatment: 13 History of Present Illness HPI Description: ADMISSION 05/02/2019 This is a 86 year old man accompanied by his daughter. He lives in [place] with his elderly wife. He has home caregivers as well. Apparently on February 28, 2019 they noted blood on his sock and noticed a wound on the tip of his right great toe. He has been cared for by Dr. Posey Pronto who is his podiatrist in [place]. Apparently they have been using Betadine to this area. The patient has a thick mycotic nail on the right as well which is embedded in the wound in some areas. Fortunately the patient has diabetic neuropathy and is not really experiencing any pain otherwise I think this would all be quite painful. The patient was previously treated in 2019 for wounds on the left foot. This included a fairly thorough vascular review by vein and vascular Dr. Oneida Alar. His ABIs at that time were noncompressible bilaterally TBI on the right was 0.88 with biphasic waveforms on the right and monophasic waveforms at the left. He ended up having amputations of the left first and second toes. He did have an angiogram on 06/25/2017 which showed on the right the right common femoral profundofemoral for Morris and superficial femoral arteries were widely patent the right popliteal was widely patent. Tibial vessels were not well opacified secondary to motion artifact however he was felt to have all 3 tibial vessels intact with some calcification and some areas of stenosis within the posterior  tibial artery but relatively good flow to the right lower leg. He also had the left reviewed as well which was the source of the problem at that time. Past medical history; prostate cancer on oral antiandrogen therapy with bone mets, GI stromal tumor in 2005, type 2 diabetes with peripheral neuropathy, hypertension, atrial fibrillation on Eliquis, history of non-Hodgkin's lymphoma treated with CHOP in 2005. He had amputations of the left first and second toes secondary to osteomyelitis. Does not appear that he has had an x-ray of the right foot ABI in our clinic on the right was noncompressible [see full arterial discussion above]. 1/19; area on the tip of the right great toe under a very mycotic nail tip. We use silver alginate. 1/26; area of the tip of the right great toe under a very mycotic nail. A lot of this seems to have closed down we have been using silver alginate 2/9; the areas on the tip of the right great toe under a very mycotic nail. A lot of this continues to close down. I do not think there is a subungual problem. I am hopeful that this mycotic nail which is thick and raised will allow full epithelialization of the wound 2/23; this was a difficult area on the tip of his right great toe. Thick mycotic nail over the base of the wound. This appears to have closed down now. The nail itself is thick split with considerable subungual debris. READMISSION 07/25/2019 This is a 86 year old man that we cared for earlier this year in the clinic without a wound on the tip of his right great toe under a very mycotic toenail. He is a type II diabetic.  His ABIs have been noncompressible however he has been felt to have adequate blood flow in the right to heal the wound. We were able to get this wound to close over as I understand things he followed with Dr. Caprice Beaver had some debridement of the nail everything looks fine and apparently the wound bed was washed off by his wife over the weekend and by  Monday it was open. His daughter is able to show me pictures. Unfortunately this now has exposed bone raising the possibility that he has had underlying osteomyelitis all along. He has been using Silvadene cream on this he has been referred back to the clinic by Dr. Caprice Beaver. He had x-rays of the right foot that did not show evidence of osteomyelitis in the right foot. He has not been on antibiotics He also had an x-ray of the left foot that showed findings suspicious for osteomyelitis involving the residual base of the first proximal phalanx and the proximal phalanx of the left second toe as well as the left second metatarsal head however he has no wounds in this area. 4/12; culture from last time of bone in the right great toe was negative. The patient's wife apparently scrubbed this toe vigorously and according to his daughter reopen the wound however unfortunately it is all exposed bone last time. I removed some of this and we use Prisma unfortunately the area does not look much better today. Previous x-rays done in podiatry office Dr. Caprice Beaver did not show evidence of osteomyelitis. Nevertheless I think osteomyelitis here is probably quite likely. Still using Prisma 4/26; he took 2 weeks of doxycycline although his daughter is telling me he is not eating. He has loose bowel movements however I did not get the sense that this is changed all that much. He still has exposed bone. Culture I did last time showed a few coag negative staph I do not think that this is necessarily relevant. I still am operating under the premise that the patient probably has osteomyelitis. After considerable difficulty we are able to determine he is not allergic to cephalosporins I therefore gave him Keflex. 5/10; is tolerating Keflex 500 3 times daily quite well. He still has the small wound in the tip of his toe with exposed bone.Marland Kitchen Unfortunately he does not really have any granulation tissue. He saw Dr. Caprice Beaver of  podiatry in [place] last week I have not had a chance to look at his notes The patient had an extensive vascular work-up including an angiogram in 2019. He was felt to have adequate blood supply on the right at the time although he did have amputations of the left first and second toe by Dr. Oneida Alar. 09/11/19 -Patient is back and is about to finish his Keflex, appointment with Dr. Oneida Alar is in the works. The right great toe wound looks about the same, the left third toe dorsal wound has healed. There was a small eschar covering it that was removed by podiatry.According to patient's daughter the right great toe base on the plantar surface had some redness that seems to have disappeared. 10/13/2019; patient is back in clinic today after 1 month hiatus although I have not seen him since 5/10. Since he was last here he was admitted to hospital from 6/2 through 6/5 because of a nonhealing wound on his right great toe. He underwent an angiogram which showed patent common femoral, profunda, SFA above and below-knee popliteal artery. The dominant runoff in the right lower extremity was through the anterior  tibial but that became very small at the level of the ankle and had minimal outflow into the foot. The peroneal artery was patent but atretic. 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 Interventions were attempted on the posterior tibial. Mechanical orbital atherectomy down to the level of the ankle and then this was angioplastied with 2.3 mm and 3 mm balloons. Flow remained very sluggish down the posterior tibial artery but was patent. Post event there apparently was embolization of an area involving the plantar left heel and sides of the left heel. He arrives back in clinic today it has been almost 7 weeks since I have seen him. Miraculously the great toe area with open bone is  closed over probably as result of Dr. Ainsley Spinner interventions. He does however have an extensive ischemic area on the plantar and medial aspect of his heel. I reviewed Dr. Ainsley Spinner last note from 3 days ago on 6/22. He did not think there was enough inflow to heal the heel wound. With eschar and sloughing. Wondered whether he could heal a below-knee amputation but more likely an above the knee amputation when required. He was placed on Xeroform to the right heel In discussing things with the patient's family and the patient present he is not in a lot of pain. The area does not appear to be grossly infected. The patient appears to be comfortable and conversational. He is eating and drinking fairly well. 7/9; the patient saw Dr. Carlis Abbott on 6/29. He had previously done heroic attempt to establish blood flow as described above. 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 Although the eschar on the heel looks ominous both plantar and medial it is certainly no worse than last week. We will continue Betadine to this area with alginate. Ultimately this will separate off and we will see about the viability of the tissue underneath. Also in clinic today our nurses were washing his right great toe tip apparently some of the skin came off eschared and there is an open wound at the tip of the great toe with exposed bone similar to what he had previously Electronic Signature(s) Signed: 10/31/2019 8:12:39 AM By: Linton Ham MD Entered By: Linton Ham on 10/27/2019 12:56:06 -------------------------------------------------------------------------------- Physical Exam Details Patient Name: Date of Service: [name] 10/27/2019 10:45 A M Medical Record Number: [id number] Patient Account Number: [id number] Date of Birth/Sex: Treating RN: 08-05-1923 (86 y.o. [name] Primary Care Provider:  Sherrie Mustache Other Clinician: Referring Provider: Treating Provider/Extender: Deloria Lair in Treatment: 57 Constitutional Patient is hypertensive.. Pulse regular and within target range for patient.Marland Kitchen Respirations regular, non-labored and within target range.. Temperature is normal and within the target range for the patient.. The patient does not look to be uncomfortable. Cardiovascular Pedal pulses absent bilaterally.. Notes The tipWound exam Great toe that I said was epithelialized last week is now reopened. Apparently happening in preparation for our clinic appointment today. This wound probes to bone On the plantar aspect the medial part of the right heel the black eschar remains. This is loosening but will take a period of time to separate. There is no indication for debridement at this point Electronic Signature(s) Signed: 10/31/2019 8:12:39 AM By: Linton Ham MD Entered By: Linton Ham on 10/27/2019 13:00:01 -------------------------------------------------------------------------------- Physician Orders  Details Patient Name: Date of Service: [name] 10/27/2019 10:45 A M Medical Record Number: [id number] Patient Account Number: [id number] Date of Birth/Sex: Treating RN: [birth year] (86 y.o. [name] Primary Care Provider: Sherrie Mustache Other Clinician: Referring Provider: Treating Provider/Extender: Deloria Lair in Treatment: 13 Verbal / Phone Orders: No Diagnosis Coding ICD-10 Coding Code Description E11.621 Type 2 diabetes mellitus with foot ulcer L97.514 Non-pressure chronic ulcer of other part of right foot with necrosis of bone E11.40 Type 2 diabetes mellitus with diabetic neuropathy, unspecified Follow-up Appointments Return Appointment in 2 weeks. Dressing Change Frequency Wound #2 Right T Great oe Change dressing every day. - home health to change 3x/week and family to change  on other days Wound #3 Right Calcaneus Change dressing every day. - home health to change 3x/week and family to change on other days Wound Cleansing Wound #2 Right T Great oe May shower and wash wound with soap and water. - on days that dressing is changed Wound #3 Right Calcaneus May shower and wash wound with soap and water. - on days that dressing is changed Primary Wound Dressing Wound #2 Right T Great oe Calcium Alginate with Silver Wound #3 Right Calcaneus lginate with Silver - over entire site Calcium A Other: - betadine to eschar sites Secondary Dressing Wound #3 Right Calcaneus Kerlix/Rolled Gauze Dry Gauze Heel [facility] for wound care. - [facility] Electronic Signature(s) Signed: 10/27/2019 5:57:27 PM By: [name] Signed: 10/31/2019 8:12:39 AM By: Linton Ham MD Entered By: [name] on 10/27/2019 12:20:11 -------------------------------------------------------------------------------- Problem List Details Patient Name: Date of Service: [name] 10/27/2019 10:45 A M Medical Record Number: [id number] Patient Account Number: [id number] Date of Birth/Sex: Treating RN: [birth year] (86 y.o. [name] Primary Care Provider: Sherrie Mustache Other Clinician: Referring Provider: Treating Provider/Extender: Deloria Lair in Treatment: 13 Active Problems ICD-10 Encounter Code Description Active Date MDM Diagnosis E11.621 Type 2 diabetes mellitus with foot ulcer 07/25/2019 No Yes L97.514 Non-pressure chronic ulcer of other part of right foot with necrosis of bone 07/25/2019 No Yes E11.40 Type 2 diabetes mellitus with diabetic neuropathy, unspecified 07/25/2019 No Yes Inactive Problems Resolved Problems Electronic Signature(s) Signed: 10/31/2019 8:12:39 AM By: Linton Ham MD Entered By: Linton Ham on 10/27/2019  12:52:55 -------------------------------------------------------------------------------- Progress Note Details Patient Name: Date of Service: [name] 10/27/2019 10:45 A M Medical Record Number: [id number] Patient Account Number: [id number] Date of Birth/Sex: Treating RN: 1923/10/10 (86 y.o. [name] Primary Care Provider: Sherrie Mustache Other Clinician: Referring Provider: Treating Provider/Extender: Deloria Lair in Treatment: 13 Subjective History of Present Illness (HPI) ADMISSION 05/02/2019 This is a 86 year old man accompanied by his daughter. He lives in [place] with his elderly wife. He has home caregivers as well. Apparently on February 28, 2019 they noted blood on his sock and noticed a wound on the tip of his right great toe. He has been cared for by Dr. Posey Pronto who is his podiatrist in [place]. Apparently they have been using Betadine to this area. The patient has a thick mycotic nail on the right as well which is embedded in the wound in some areas. Fortunately the patient has diabetic neuropathy and is not really experiencing any pain otherwise I think this would all be quite painful. The patient was previously treated in 2019 for wounds on the left foot. This included a fairly thorough vascular review  by vein and vascular Dr. Oneida Alar. His ABIs at that time were noncompressible bilaterally TBI on the right was 0.88 with biphasic waveforms on the right and monophasic waveforms at the left. He ended up having amputations of the left first and second toes. He did have an angiogram on 06/25/2017 which showed on the right the right common femoral profundofemoral for Morris and superficial femoral arteries were widely patent the right popliteal was widely patent. Tibial vessels were not well opacified secondary to motion artifact however he was felt to have all 3 tibial vessels intact with some calcification and some areas  of stenosis within the posterior tibial artery but relatively good flow to the right lower leg. He also had the left reviewed as well which was the source of the problem at that time. Past medical history; prostate cancer on oral antiandrogen therapy with bone mets, GI stromal tumor in 2005, type 2 diabetes with peripheral neuropathy, hypertension, atrial fibrillation on Eliquis, history of non-Hodgkin's lymphoma treated with CHOP in 2005. He had amputations of the left first and second toes secondary to osteomyelitis. Does not appear that he has had an x-ray of the right foot ABI in our clinic on the right was noncompressible [see full arterial discussion above]. 1/19; area on the tip of the right great toe under a very mycotic nail tip. We use silver alginate. 1/26; area of the tip of the right great toe under a very mycotic nail. A lot of this seems to have closed down we have been using silver alginate 2/9; the areas on the tip of the right great toe under a very mycotic nail. A lot of this continues to close down. I do not think there is a subungual problem. I am hopeful that this mycotic nail which is thick and raised will allow full epithelialization of the wound 2/23; this was a difficult area on the tip of his right great toe. Thick mycotic nail over the base of the wound. This appears to have closed down now. The nail itself is thick split with considerable subungual debris. READMISSION 07/25/2019 This is a 86 year old man that we cared for earlier this year in the clinic without a wound on the tip of his right great toe under a very mycotic toenail. He is a type II diabetic. His ABIs have been noncompressible however he has been felt to have adequate blood flow in the right to heal the wound. We were able to get this wound to close over as I understand things he followed with Dr. Caprice Beaver had some debridement of the nail everything looks fine and apparently the wound bed was washed off by  his wife over the weekend and by Monday it was open. His daughter is able to show me pictures. Unfortunately this now has exposed bone raising the possibility that he has had underlying osteomyelitis all along. He has been using Silvadene cream on this he has been referred back to the clinic by Dr. Caprice Beaver. He had x-rays of the right foot that did not show evidence of osteomyelitis in the right foot. He has not been on antibiotics He also had an x-ray of the left foot that showed findings suspicious for osteomyelitis involving the residual base of the first proximal phalanx and the proximal phalanx of the left second toe as well as the left second metatarsal head however he has no wounds in this area. 4/12; culture from last time of bone in the right great toe was negative. The patient's  wife apparently scrubbed this toe vigorously and according to his daughter reopen the wound however unfortunately it is all exposed bone last time. I removed some of this and we use Prisma unfortunately the area does not look much better today. Previous x-rays done in podiatry office Dr. Caprice Beaver did not show evidence of osteomyelitis. Nevertheless I think osteomyelitis here is probably quite likely. Still using Prisma 4/26; he took 2 weeks of doxycycline although his daughter is telling me he is not eating. He has loose bowel movements however I did not get the sense that this is changed all that much. He still has exposed bone. Culture I did last time showed a few coag negative staph I do not think that this is necessarily relevant. I still am operating under the premise that the patient probably has osteomyelitis. After considerable difficulty we are able to determine he is not allergic to cephalosporins I therefore gave him Keflex. 5/10; is tolerating Keflex 500 3 times daily quite well. He still has the small wound in the tip of his toe with exposed bone.Marland Kitchen Unfortunately he does not really have any granulation  tissue. He saw Dr. Caprice Beaver of podiatry in [place] last week I have not had a chance to look at his notes The patient had an extensive vascular work-up including an angiogram in 2019. He was felt to have adequate blood supply on the right at the time although he did have amputations of the left first and second toe by Dr. Oneida Alar. 09/11/19 -Patient is back and is about to finish his Keflex, appointment with Dr. Oneida Alar is in the works. The right great toe wound looks about the same, the left third toe dorsal wound has healed. There was a small eschar covering it that was removed by podiatry.According to patient's daughter the right great toe base on the plantar surface had some redness that seems to have disappeared. 10/13/2019; patient is back in clinic today after 1 month hiatus although I have not seen him since 5/10. Since he was last here he was admitted to hospital from 6/2 through 6/5 because of a nonhealing wound on his right great toe. He underwent an angiogram which showed patent common femoral, profunda, SFA above and below-knee popliteal artery. The dominant runoff in the right lower extremity was through the anterior tibial but that became very small at the level of the ankle and had minimal outflow into the foot. The peroneal artery was patent but atretic. 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 Interventions were attempted on the posterior tibial. Mechanical orbital atherectomy down to the level of the ankle and then this was angioplastied with 2.3 mm and 3 mm balloons. Flow remained very sluggish down the posterior tibial artery but was patent. Post event there apparently was embolization of an area involving the plantar left heel and sides of the left heel. He arrives back in clinic today it has been almost 7 weeks since I have seen him. Miraculously the  great toe area with open bone is closed over probably as result of Dr. Ainsley Spinner interventions. He does however have an extensive ischemic area on the plantar and medial aspect of his heel. I reviewed Dr. Ainsley Spinner last note from 3 days ago on 6/22. He did not think there was enough inflow to heal the heel wound. With eschar and sloughing. Wondered whether he could heal a below-knee  amputation but more likely an above the knee amputation when required. He was placed on Xeroform to the right heel In discussing things with the patient's family and the patient present he is not in a lot of pain. The area does not appear to be grossly infected. The patient appears to be comfortable and conversational. He is eating and drinking fairly well. 7/9; the patient saw Dr. Carlis Abbott on 6/29. He had previously done heroic attempt to establish blood flow as described above. 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 Although the eschar on the heel looks ominous both plantar and medial it is certainly no worse than last week. We will continue Betadine to this area with alginate. Ultimately this will separate off and we will see about the viability of the tissue underneath. Also in clinic today our nurses were washing his right great toe tip apparently some of the skin came off eschared and there is an open wound at the tip of the great toe with exposed bone similar to what he had previously Objective Constitutional Patient is hypertensive.. Pulse regular and within target range for patient.Marland Kitchen Respirations regular, non-labored and within target range.. Temperature is normal and within the target range for the patient.. The patient does not look to be uncomfortable. Vitals Time Taken: 11:35 AM, Height: 70 in, Weight: 140 lbs, BMI: 20.1, Temperature: 97.7 F, Pulse: 64 bpm, Respiratory Rate: 16 breaths/min,  Blood Pressure: 166/82 mmHg. Cardiovascular Pedal pulses absent bilaterally.. General Notes: The tipWound exam ooGreat toe that I said was epithelialized last week is now reopened. Apparently happening in preparation for our clinic appointment today. This wound probes to bone ooOn the plantar aspect the medial part of the right heel the black eschar remains. This is loosening but will take a period of time to separate. There is no indication for debridement at this point Integumentary (Hair, Skin) Wound #2 status is Open. Original cause of wound was Gradually Appeared. The wound is located on the Right T Great. The wound measures 1cm length x oe 0.6cm width x 0.2cm depth; 0.471cm^2 area and 0.094cm^3 volume. There is no tunneling or undermining noted. There is a none present amount of drainage noted. The wound margin is distinct with the outline attached to the wound base. There is no granulation within the wound bed. There is a large (67-100%) amount of necrotic tissue within the wound bed including Eschar. Wound #3 status is Open. Original cause of wound was Gradually Appeared. The wound is located on the Right Calcaneus. The wound measures 7cm length x 7cm width x 0.1cm depth; 38.485cm^2 area and 3.848cm^3 volume. There is Fat Layer (Subcutaneous Tissue) Exposed exposed. There is no tunneling or undermining noted. There is a medium amount of serous drainage noted. The wound margin is flat and intact. There is large (67-100%) pink granulation within the wound bed. There is no necrotic tissue within the wound bed. Assessment Active Problems ICD-10 Type 2 diabetes mellitus with foot ulcer Non-pressure chronic ulcer of other part of right foot with necrosis of bone Type 2 diabetes mellitus with diabetic neuropathy, unspecified Plan Follow-up Appointments: Return Appointment in 2 weeks. Dressing Change Frequency: Wound #2 Right T Great: oe Change dressing every day. - home health to  change 3x/week and family to change on other days Wound #3 Right Calcaneus: Change dressing every day. - home health to change 3x/week and family to change on other days Wound  Cleansing: Wound #2 Right T Great: oe May shower and wash wound with soap and water. - on days that dressing is changed Wound #3 Right Calcaneus: May shower and wash wound with soap and water. - on days that dressing is changed Primary Wound Dressing: Wound #2 Right T Great: oe Calcium Alginate with Silver Wound #3 Right Calcaneus: Calcium Alginate with Silver - over entire site Other: - betadine to eschar sites Secondary Dressing: Wound #3 Right Calcaneus: Kerlix/Rolled Gauze Dry Gauze Heel Cup Home Health: [facility] for wound care. - [facility] 1. Silver alginate to the right great toe this has exposed bone. This was the reason for the underlying surgery in the first place the attempt to revascularize 2. Wounds on the heel medially and plantar. We will continue with Betadine to these until the tissue begins to separate and then we will try to deal with the wounds and the tissue underneath. Right now no need for debridement Electronic Signature(s) Signed: 10/31/2019 8:12:39 AM By: Linton Ham MD Entered By: Linton Ham on 10/27/2019 13:01:02 -------------------------------------------------------------------------------- SuperBill Details Patient Name: Date of Service: [name] 10/27/2019 Medical Record Number: [id number] Patient Account Number: [id number] Date of Birth/Sex: Treating RN: June 08, 1923 (86 y.o. [name] Primary Care Provider: Sherrie Mustache Other Clinician: Referring Provider: Treating Provider/Extender: Deloria Lair in Treatment: 13 Diagnosis Coding ICD-10 Codes Code Description E11.621 Type 2 diabetes mellitus with foot ulcer L97.514 Non-pressure chronic ulcer of other part of right foot with necrosis of  bone E11.40 Type 2 diabetes mellitus with diabetic neuropathy, unspecified Facility Procedures CPT4 Code: [id number] Description: 99214 - WOUND CARE VISIT-LEV 4 EST PT Modifier: Quantity: 1 Physician Procedures : CPT4 Code Description Modifier [id number] - WC PHYS LEVEL 3 - EST PT ICD-10 Diagnosis Description E11.621 Type 2 diabetes mellitus with foot ulcer L97.514 Non-pressure chronic ulcer of other part of right foot with necrosis of bone E11.40 Type 2  diabetes mellitus with diabetic neuropathy, unspecified Quantity: 1 Electronic Signature(s) Signed: 10/31/2019 8:12:39 AM By: Linton Ham MD Entered By: Linton Ham on 10/27/2019 13:01:19

## 2019-11-01 ENCOUNTER — Inpatient Hospital Stay (HOSPITAL_COMMUNITY): Payer: Medicare Other | Attending: Hematology

## 2019-11-01 ENCOUNTER — Inpatient Hospital Stay (HOSPITAL_BASED_OUTPATIENT_CLINIC_OR_DEPARTMENT_OTHER): Payer: Medicare Other | Admitting: Nurse Practitioner

## 2019-11-01 ENCOUNTER — Inpatient Hospital Stay (HOSPITAL_COMMUNITY): Payer: Medicare Other

## 2019-11-01 DIAGNOSIS — C61 Malignant neoplasm of prostate: Secondary | ICD-10-CM | POA: Insufficient documentation

## 2019-11-01 DIAGNOSIS — C7951 Secondary malignant neoplasm of bone: Secondary | ICD-10-CM | POA: Diagnosis not present

## 2019-11-01 LAB — COMPREHENSIVE METABOLIC PANEL
ALT: 13 U/L (ref 0–44)
AST: 14 U/L — ABNORMAL LOW (ref 15–41)
Albumin: 3.4 g/dL — ABNORMAL LOW (ref 3.5–5.0)
Alkaline Phosphatase: 75 U/L (ref 38–126)
Anion gap: 11 (ref 5–15)
BUN: 13 mg/dL (ref 8–23)
CO2: 26 mmol/L (ref 22–32)
Calcium: 9.8 mg/dL (ref 8.9–10.3)
Chloride: 99 mmol/L (ref 98–111)
Creatinine, Ser: 0.76 mg/dL (ref 0.61–1.24)
GFR calc Af Amer: >60 mL/min (ref >60)
GFR calc non Af Amer: >60 mL/min (ref >60)
Glucose, Bld: 161 mg/dL — ABNORMAL HIGH (ref 70–99)
Potassium: 4.4 mmol/L (ref 3.5–5.1)
Sodium: 136 mmol/L (ref 135–145)
Total Bilirubin: 0.5 mg/dL (ref 0.3–1.2)
Total Protein: 6.7 g/dL (ref 6.5–8.1)

## 2019-11-01 LAB — LACTATE DEHYDROGENASE: LDH: 97 U/L — ABNORMAL LOW (ref 98–192)

## 2019-11-01 LAB — CBC WITH DIFFERENTIAL/PLATELET
Abs Immature Granulocytes: 0.28 10*3/uL — ABNORMAL HIGH (ref 0.00–0.07)
Basophils Absolute: 0 10*3/uL (ref 0.0–0.1)
Basophils Relative: 0 %
Eosinophils Absolute: 0.1 10*3/uL (ref 0.0–0.5)
Eosinophils Relative: 1 %
HCT: 30.6 % — ABNORMAL LOW (ref 39.0–52.0)
Hemoglobin: 9.9 g/dL — ABNORMAL LOW (ref 13.0–17.0)
Immature Granulocytes: 2 %
Lymphocytes Relative: 17 %
Lymphs Abs: 2 10*3/uL (ref 0.7–4.0)
MCH: 29.8 pg (ref 26.0–34.0)
MCHC: 32.4 g/dL (ref 30.0–36.0)
MCV: 92.2 fL (ref 80.0–100.0)
Monocytes Absolute: 1.8 10*3/uL — ABNORMAL HIGH (ref 0.1–1.0)
Monocytes Relative: 15 %
Neutro Abs: 7.6 10*3/uL (ref 1.7–7.7)
Neutrophils Relative %: 65 %
Platelets: 231 10*3/uL (ref 150–400)
RBC: 3.32 MIL/uL — ABNORMAL LOW (ref 4.22–5.81)
RDW: 13.7 % (ref 11.5–15.5)
WBC: 11.8 10*3/uL — ABNORMAL HIGH (ref 4.0–10.5)
nRBC: 0 % (ref 0.0–0.2)

## 2019-11-01 LAB — PSA: Prostatic Specific Antigen: <0.01 ng/mL (ref 0.00–4.00)

## 2019-11-01 MED ORDER — DENOSUMAB 120 MG/1.7ML ~~LOC~~ SOLN
120.0000 mg | Freq: Once | SUBCUTANEOUS | Status: AC
  Administered 2019-11-01: 120 mg via SUBCUTANEOUS
  Filled 2019-11-01: qty 1.7

## 2019-11-01 NOTE — Progress Notes (Signed)
[place], [place] [address]   CLINIC:  Medical Oncology/Hematology  PCP:  [name], NP [place] [address] [phone]   REASON FOR VISIT: Follow-up for metastatic prostate cancer   CURRENT THERAPY: Zytiga and prednisone  BRIEF ONCOLOGIC HISTORY:  Oncology History  GIST (gastrointestinal stromal tumor), malignant ([place])  08/18/2011 Initial Diagnosis   GIST (gastrointestinal stromal tumor), malignant ([place])   02/08/2017 Genetic Testing   ATM Gain (Exons 62-63) VUS identified on the multi-gene panel.  The Multi-Gene Panel offered by [facility] includes sequencing and/or deletion duplication testing of the following 80 genes: ALK, APC, ATM, AXIN2,BAP1,  BARD1, BLM, BMPR1A, BRCA1, BRCA2, BRIP1, CASR, CDC73, CDH1, CDK4, CDKN1B, CDKN1C, CDKN2A (p14ARF), CDKN2A (p16INK4a), CEBPA, CHEK2, CTNNA1, DICER1, DIS3L2, EGFR (c.2369C>T, p.Thr790Met variant only), EPCAM (Deletion/duplication testing only), FH, FLCN, GATA2, GPC3, GREM1 (Promoter region deletion/duplication testing only), HOXB13 (c.251G>A, p.Gly84Glu), HRAS, KIT, MAX, MEN1, MET, MITF (c.952G>A, p.Glu318Lys variant only), MLH1, MSH2, MSH3, MSH6, MUTYH, NBN, NF1, NF2, NTHL1, PALB2, PDGFRA, PHOX2B, PMS2, POLD1, POLE, POT1, PRKAR1A, PTCH1, PTEN, RAD50, RAD51C, RAD51D, RB1, RECQL4, RET, RUNX1, SDHAF2, SDHA (sequence changes only), SDHB, SDHC, SDHD, SMAD4, SMARCA4, SMARCB1, SMARCE1, STK11, SUFU, TERT, TERT, TMEM127, TP53, TSC1, TSC2, VHL, WRN and WT1.  The report date is February 08, 2017.    Prostate cancer ([place])  09/08/2013 Initial Diagnosis   Prostate cancer ([place])   02/08/2017 Genetic Testing   ATM Gain (Exons 62-63) VUS identified on the multi-gene panel.  The Multi-Gene Panel offered by [facility] includes sequencing and/or deletion duplication testing of the following 80 genes: ALK, APC, ATM, AXIN2,BAP1,  BARD1, BLM, BMPR1A, BRCA1, BRCA2, BRIP1, CASR, CDC73, CDH1, CDK4, CDKN1B,  CDKN1C, CDKN2A (p14ARF), CDKN2A (p16INK4a), CEBPA, CHEK2, CTNNA1, DICER1, DIS3L2, EGFR (c.2369C>T, p.Thr790Met variant only), EPCAM (Deletion/duplication testing only), FH, FLCN, GATA2, GPC3, GREM1 (Promoter region deletion/duplication testing only), HOXB13 (c.251G>A, p.Gly84Glu), HRAS, KIT, MAX, MEN1, MET, MITF (c.952G>A, p.Glu318Lys variant only), MLH1, MSH2, MSH3, MSH6, MUTYH, NBN, NF1, NF2, NTHL1, PALB2, PDGFRA, PHOX2B, PMS2, POLD1, POLE, POT1, PRKAR1A, PTCH1, PTEN, RAD50, RAD51C, RAD51D, RB1, RECQL4, RET, RUNX1, SDHAF2, SDHA (sequence changes only), SDHB, SDHC, SDHD, SMAD4, SMARCA4, SMARCB1, SMARCE1, STK11, SUFU, TERT, TERT, TMEM127, TP53, TSC1, TSC2, VHL, WRN and WT1.  The report date is February 08, 2017.      CANCER STAGING: Cancer Staging NHL (non-Hodgkin's lymphoma) (HCC) Staging form: Lymphoid Neoplasms, AJCC 6th Edition - Clinical: Stage III - Signed by Baird Cancer, PA on 08/18/2011    INTERVAL HISTORY:  [name] 86 y.o. male returns for routine follow-up for metastatic prostate cancer.  Patient reports his fatigue is still worse during the day. Denies any nausea, vomiting, or diarrhea. Denies any new pains. Had not noticed any recent bleeding such as epistaxis, hematuria or hematochezia. Denies recent chest pain on exertion, shortness of breath on minimal exertion, pre-syncopal episodes, or palpitations. Denies any numbness or tingling in hands or feet. Denies any recent fevers, infections, or recent hospitalizations. Patient reports appetite at 25% and energy level at 25%.    REVIEW OF SYSTEMS:  Review of Systems  Constitutional: Positive for fatigue.  All other systems reviewed and are negative.    PAST MEDICAL/SURGICAL HISTORY:  Past Medical History:  Diagnosis Date  . Acute bronchitis   . Acute lower UTI 11/25/2017  . Amputation of toe of left foot ([facility]) 07/30/2017, 08/27/2017    Great toe 07/30/2017 Second toe 08/27/2017   . Arthritis   . Aspiration pneumonia  ([place])  01/31/2018  . Atrial fibrillation ([place])    Dr. Harl Bowie- [facility] follows saw 11'14  . Bladder stones    tx. with oral meds and antibiotics, now surgery planned  . Bone metastases ([place]) 08/21/2015  . Cataracts, both eyes    surgery planned May 2015  . Community acquired pneumonia 04/16/2018  . Cystoid macular edema 04/21/2015    Right eye 04/21/2015   . Dehydration 04/16/2018  . Dementia without behavioral disturbance ([place]) 04/16/2018  . Diabetes mellitus without complication ([place])    Type II  . Diarrhea 11/25/2017  . Dyspnea    with activity  . Dysrhythmia    Afib  . Family history of breast cancer   . Family history of colon cancer   . Genetic testing 02/09/2017  . GERD (gastroesophageal reflux disease)   . 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 Margot Chimes, thus far without recurrence.   Marland Kitchen History of MRI of lumbar spine 03/05/2014  . Hypertension   . Hypertrophy of prostate with urinary obstruction   . Hypomagnesemia 03/16/2017  . Metabolic encephalopathy 53/66/4403  . Metastatic adenocarcinoma to prostate ([place]) 09/08/2013  . 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 NHL (non-Hodgkin's lymphoma) ([place]) 2005   Diffuse large B-cell lymphoma, clinically stage IIIA, CD20 positive, status post cervical lymph node biopsy 08/03/2003 on the left. PET scan was also positive in the spleen and small bowel region, but bone marrow aspiration and biopsy were negative. So he essentially had stage IIIAs. He received R-CHOP x6 cycles with CR established by PET scan criteria on 11/23/2003 with no evidence for relapse th  . Numbness 02/19/2017   Per [facility] new patient packet  . Osteomyelitis ([facility]) 06/29/2017   toe of left foot   . PAD (peripheral  artery disease) ([place]) 08/27/2017  . Polyneuropathy 02/19/2017  . Postural dizziness with presyncope 02/19/2017  . S/P TURP 08/10/2013  . Sepsis ([place]) 07/02/2018  . Skin cancer    Basal cell- face,head, neck,  arms, legs, Back  . Urinary frequency 02/19/2017   Past Surgical History:  Procedure Laterality Date  . ABDOMINAL AORTOGRAM W/LOWER EXTREMITY N/A 09/20/2019   Procedure: ABDOMINAL AORTOGRAM W/LOWER EXTREMITY;  Surgeon: Marty Heck, MD;  Location: [facility];  Service: Cardiovascular;  Laterality: N/A;  . AMPUTATION Left 06/29/2017   Procedure: AMPUTATION LEFT GREAT TOE;  Surgeon: [name], MD;  Location: [facility];  Service: Vascular;  Laterality: Left;  . AMPUTATION Left 08/27/2017   Procedure: AMPUTATION Left SECOND TOE;  Surgeon: [name], MD;  Location: [facility];  Service: Vascular;  Laterality: Left;  . CATARACT EXTRACTION Bilateral 2015  . CHOLECYSTECTOMY    . COLON RESECTION     small bowel  . CYSTOSCOPY WITH LITHOLAPAXY N/A 08/10/2013   Procedure: CYSTOSCOPY WITH LITHOLAPAXY WITH [name];  Surgeon: [name], MD;  Location[facility];  Service: Urology;  Laterality: N/A;  . ESOPHAGEAL DILATION N/A 02/27/2016   Procedure: ESOPHAGEAL DILATION;  Surgeon: [name], MD;  [facility];  Service: Endoscopy;  Laterality: N/A;  . ESOPHAGEAL DILATION N/A 07/07/2018   Procedure: ESOPHAGEAL DILATION;  Surgeon: [name], MD;  [facility];  Service: Endoscopy;  Laterality: N/A;  . ESOPHAGOGASTRODUODENOSCOPY N/A 02/27/2016   Procedure: ESOPHAGOGASTRODUODENOSCOPY (EGD);  Surgeon: [name], MD;  [facility];  Service: Endoscopy;  Laterality: N/A;  12:00  . ESOPHAGOGASTRODUODENOSCOPY N/A 07/07/2018   Procedure: ESOPHAGOGASTRODUODENOSCOPY (EGD);  Surgeon: [name], MD;  [facility];  Service: Endoscopy;  Laterality: N/A;  . INCISION AND DRAINAGE PERIRECTAL ABSCESS    . LOWER EXTREMITY  ANGIOGRAPHY N/A 06/25/2017   Procedure: LOWER EXTREMITY ANGIOGRAPHY;  Surgeon: [name], MD;  [facility]: [facility];  Service: Cardiovascular;  Laterality: N/A;  . LYMPH NODE DISSECTION Left    '05-neck  . PERIPHERAL VASCULAR ATHERECTOMY Right 09/20/2019   Procedure: PERIPHERAL VASCULAR ATHERECTOMY;  Surgeon: Marty Heck, MD;  Location: [facility];  Service: Cardiovascular;  Laterality: Right;  Posterior tibial  . PORT-A-CATH REMOVAL    . PORTACATH PLACEMENT     insertion and removal -last chemotherapy 10 yrs ago  . TRANSURETHRAL RESECTION OF PROSTATE N/A 08/10/2013   Procedure: TRANSURETHRAL RESECTION OF THE PROSTATE WITH GYRUS INSTRUMENTS;  Surgeon: [name], MD;  [facility];  Service: Urology;  Laterality: N/A;     SOCIAL HISTORY:  Social History   Socioeconomic History  . Marital status: Married    Spouse name: Not on file  . Number of children: Not on file  . Years of education: Not on file  . Highest education level: Not on file  Occupational History  . Occupation: retired    Fish farm manager: RETIRED    Comment: salesman  Tobacco Use  . Smoking status: Never Smoker  . Smokeless tobacco: Never Used  Vaping Use  . Vaping Use: Never used  Substance and Sexual Activity  . Alcohol use: No  . Drug use: No  . Sexual activity: Not Currently  Other Topics Concern  . Not on file  Social History Narrative   Diet: Soft foods      Do you drink/eat things with caffeine No      Marital Status: Marred   What year were you married? 1949      Do you live in a house, apartment, assisted living, condo, trailer, etc.? House      Is it one or more stories? One      How many persons live in your home? 2         Do you have any pets in your home?(please list) No      Highest level of education completed: 9th      Current or past profession: [employer] you exercise? No Type and how often:      Living Will? Yes      DNR form?  NO  If not do you wish to discuss one? Yes      POA/HPOA forms? Yes      Difficulty bathing or dressing yourself? Yes      Difficulty preparing food or eating? Yes      Difficulty managing medications? Yes      Difficulty managing your finances? Yes      Difficulty affording your medications? No                     Social Determinants of Health   Financial Resource Strain:   . Difficulty of Paying Living Expenses:   Food Insecurity:   . Worried About Charity fundraiser in the Last Year:   . Arboriculturist in the Last Year:   Transportation Needs: No Transportation Needs  .  Lack of Transportation (Medical): No  . Lack of Transportation (Non-Medical): No  Physical Activity:   . Days of Exercise per Week:   . Minutes of Exercise per Session:   Stress:   . Feeling of Stress :   Social Connections:   . Frequency of Communication with Friends and Family:   . Frequency of Social Gatherings with Friends and Family:   . Attends Religious Services:   . Active Member of Clubs or Organizations:   . Attends Archivist Meetings:   Marland Kitchen Marital Status:   Intimate Partner Violence:   . Fear of Current or Ex-Partner:   . Emotionally Abused:   Marland Kitchen Physically Abused:   . Sexually Abused:     FAMILY HISTORY:  Family History  Problem Relation Age of Onset  . Heart attack Mother        died in her 85s  . Other Father 65       pneumonia - died  . Breast cancer Sister        dx in her 52s-60s  . Colon cancer Brother        dx in his 42s  . Cancer Sister        TAH/BSO due to "male cancer"  . Breast cancer Daughter 37  . Breast cancer Daughter 45       DCIS - ATM VUS on [facility] 83 gene panel in 2018  . Breast cancer Daughter 78       DCIS - Negative on [facility] My[facility] 25 gene apnel in 2015  . Thyroid cancer Daughter 27       Medullary and Papillary  . Thyroid nodules Grandchild     CURRENT MEDICATIONS:  Outpatient Encounter Medications as of 11/01/2019    Medication Sig  . abiraterone acetate (ZYTIGA) 250 MG tablet Take 1 tablet (250 mg total) by mouth daily before breakfast. Take on an empty stomach 1 hour before or 2 hours after a meal  . acetaminophen (TYLENOL) 500 MG tablet Take 1 tablet (500 mg total) by mouth 3 (three) times daily.  Marland Kitchen apixaban (ELIQUIS) 5 MG TABS tablet Take 5 mg by mouth 2 (two) times daily.  . blood glucose meter kit and supplies Dispense based on patient and insurance preference. Use four times daily as directed. (FOR ICD-10 E10.9, E11.9).  . Calcium Polycarbophil (FIBER-CAPS PO) Take 2-3 capsules by mouth See admin instructions. Take 3 capsules by mouth in the morning & take 2 capsules by mouth in the evening.  . Cholecalciferol (VITAMIN D3) 1000 units CAPS Take 1,000 Units by mouth daily.   . clopidogrel (PLAVIX) 75 MG tablet Take 1 tablet (75 mg total) by mouth daily.  . clotrimazole-betamethasone (LOTRISONE) cream Apply 1 application topically 2 (two) times daily as needed (skin irriation.).   Marland Kitchen Cranberry 500 MG TABS Take 500 mg by mouth daily.   Marland Kitchen denosumab (XGEVA) 120 MG/1.7ML SOLN injection Inject 120 mg into the skin every 30 (thirty) days.  . diclofenac Sodium (VOLTAREN) 1 % GEL Apply 2 g topically 4 (four) times daily.  Marland Kitchen diltiazem (CARDIZEM) 60 MG tablet Take 60 mg by mouth 2 (two) times daily.  Marland Kitchen donepezil (ARICEPT) 10 MG tablet Take 1 tablet by mouth once daily  . feeding supplement, ENSURE ENLIVE, (ENSURE ENLIVE) LIQD Take 237 mLs by mouth daily. CHOCOLATE   . fluticasone (FLONASE) 50 MCG/ACT nasal spray Place 2 sprays into both nostrils daily as needed (allergies.).   Marland Kitchen furosemide (LASIX) 20 MG tablet Take  20 mg by mouth daily as needed (retention/swelling.).   Marland Kitchen glucose blood (EASYMAX TEST) test strip Use as instructed  . Lancets 28G MISC 1 kit by Does not apply route every morning.  Marland Kitchen Leuprolide Acetate, 6 Month, (LUPRON DEPOT, 44-MONTH,) 45 MG injection Inject 45 mg into the muscle every 6 (six) months.   . metFORMIN (GLUCOPHAGE) 850 MG tablet Take 850 mg by mouth 2 (two) times daily with a meal.   . mirtazapine (REMERON) 15 MG tablet Take 1 tablet (15 mg total) by mouth at bedtime.  . Multiple Vitamin (MULTIVITAMIN WITH MINERALS) TABS tablet Take 1 tablet by mouth daily.   . predniSONE (DELTASONE) 2.5 MG tablet Take 2.5 mg by mouth 2 (two) times daily with a meal.  . Probiotic Product (PROBIOTIC DAILY PO) Take 1 tablet by mouth daily. Every morning  . traMADol (ULTRAM) 50 MG tablet Take 1 tablet (50 mg total) by mouth every 6 (six) hours as needed for moderate pain.  Marland Kitchen trimethoprim (TRIMPEX) 100 MG tablet Take 1 tablet (100 mg total) by mouth at bedtime.  . [DISCONTINUED] cephALEXin (KEFLEX) 250 MG capsule Take 1 capsule (250 mg total) by mouth 2 (two) times daily.  . nitroGLYCERIN (NITRODUR - DOSED IN MG/24 HR) 0.2 mg/hr patch Place 1 patch (0.2 mg total) onto the skin daily. (Patient not taking: Reported on 11/01/2019)   No facility-administered encounter medications on file as of 11/01/2019.    ALLERGIES:  Allergies  Allergen Reactions  . Tape Other (See Comments)    SKIN IS VERY THIN AND WILL TEAR AND BRUISE VERY EASILY!!!!  . Augmentin [Amoxicillin-Pot Clavulanate] Rash and Other (See Comments)    Redness of skin     PHYSICAL EXAM:  ECOG Performance status: 1  Vitals:   11/01/19 1334  BP: (!) 151/65  Pulse: 71  Resp: 18  Temp: (!) 97.5 F (36.4 C)  SpO2: 100%   Filed Weights   11/01/19 1334  Weight: 140 lb 3.4 oz (63.6 kg)   Physical Exam Constitutional:      Appearance: He is ill-appearing.  Cardiovascular:     Rate and Rhythm: Normal rate and regular rhythm.     Heart sounds: Normal heart sounds.  Pulmonary:     Effort: Pulmonary effort is normal.     Breath sounds: Normal breath sounds.  Abdominal:     General: Bowel sounds are normal.     Palpations: Abdomen is soft.  Musculoskeletal:        General: Normal range of motion.  Skin:    General: Skin is  warm.  Neurological:     Mental Status: He is alert and oriented to person, place, and time. Mental status is at baseline.  Psychiatric:        Mood and Affect: Mood normal.        Behavior: Behavior normal.        Thought Content: Thought content normal.        Judgment: Judgment normal.      LABORATORY DATA:  I have reviewed the labs as listed.  CBC    Component Value Date/Time   WBC 11.8 (H) 11/01/2019 1239   RBC 3.32 (L) 11/01/2019 1239   HGB 9.9 (L) 11/01/2019 1239   HCT 30.6 (L) 11/01/2019 1239   PLT 231 11/01/2019 1239   MCV 92.2 11/01/2019 1239   MCH 29.8 11/01/2019 1239   MCHC 32.4 11/01/2019 1239   RDW 13.7 11/01/2019 1239   LYMPHSABS 2.0 11/01/2019 1239  MONOABS 1.8 (H) 11/01/2019 1239   EOSABS 0.1 11/01/2019 1239   BASOSABS 0.0 11/01/2019 1239   CMP Latest Ref Rng & Units 11/01/2019 09/22/2019 09/21/2019  Glucose 70 - 99 mg/dL 161(H) 182(H) 219(H)  BUN 8 - 23 mg/dL _0 Creatinine 0.61 - 1.24 mg/dL 0.76 1.21 0.99  Sodium 135 - 145 mmol/L 136 140 138  Potassium 3.5 - 5.1 mmol/L 4.4 4.9 3.6  Chloride 98 - 111 mmol/L 99 111 110  CO2 22 - 32 mmol/L 26 21(L) 21(L)  Calcium 8.9 - 10.3 mg/dL 9.8 9.1 9.0  Total Protein 6.5 - 8.1 g/dL 6.7 4.9(L) -  Total Bilirubin 0.3 - 1.2 mg/dL 0.5 0.8 -  Alkaline Phos 38 - 126 U/L 75 38 -  AST 15 - 41 U/L 14(L) 16 -  ALT 0 - 44 U/L 13 14 -    All questions were answered to patient's stated satisfaction. Encouraged patient to call with any new concerns or questions before his next visit to the cancer center and we can certain see him sooner, if needed.     ASSESSMENT & PLAN:  Prostate cancer ([place]) 1.  Metastatic prostate cancer to the bones: -Zytiga and prednisone started October 2016. -Bone scan on 08/27/2017 showed interval improvement in the T12 lesions.  No new lesions. -Zytiga dose reduced 100 mg daily and prednisone 2.5 mg daily on 02/10/2018. -Zytiga was held for 1 month but his symptoms of abdominal and leg pains  did not improve.  His energy levels also did not improve. -He was started back on Zytiga 500 mg daily on 08/09/2019 along with prednisone. -He was talked about continuing therapy and best supportive care.  He was enrolled in palliative care at that time.  He would like to continue therapy for his metastatic cancer. -His dose of Zytiga was decreased to 250 mg daily. -He will receive his Lupron injections.  His PSA is still undetectable. -She will follow-up in 1 month with repeat labs  2.  Hypercalcemia: -His calcium supplements were held at last visit. -Labs done on 11/01/2019 showed his calcium level 9.8.  3.  IgG M MGUS: -SPEP on 01/25/2019 showed M spike of 0.4 g/dL.  Light chain ratio is 0.91  4.  Bone metastasis: -She will receive Xgeva today and monthly.      Orders placed this encounter:  Orders Placed This Encounter  Procedures  . Lactate dehydrogenase  . CBC with Differential/Platelet  . Comprehensive metabolic panel      Francene Finders, FNP-C [facility] [phone]

## 2019-11-01 NOTE — Assessment & Plan Note (Signed)
1.  Metastatic prostate cancer to the bones: -Zytiga and prednisone started October 2016. -Bone scan on 08/27/2017 showed interval improvement in the T12 lesions.  No new lesions. -Zytiga dose reduced 100 mg daily and prednisone 2.5 mg daily on 02/10/2018. -Zytiga was held for 1 month but his symptoms of abdominal and leg pains did not improve.  His energy levels also did not improve. -He was started back on Zytiga 500 mg daily on 08/09/2019 along with prednisone. -He was talked about continuing therapy and best supportive care.  He was enrolled in palliative care at that time.  He would like to continue therapy for his metastatic cancer. -His dose of Zytiga was decreased to 250 mg daily. -He will receive his Lupron injections.  His PSA is still undetectable. -She will follow-up in 1 month with repeat labs  2.  Hypercalcemia: -His calcium supplements were held at last visit. -Labs done on 11/01/2019 showed his calcium level 9.8.  3.  IgG M MGUS: -SPEP on 01/25/2019 showed M spike of 0.4 g/dL.  Light chain ratio is 0.91  4.  Bone metastasis: -She will receive Xgeva today and monthly.

## 2019-11-01 NOTE — Patient Instructions (Signed)
[facility] at [facility] Discharge Instructions     Thank you for choosing [facility] at [facility] to provide your oncology and hematology care.  To afford each patient quality time with our provider, please arrive at least 15 minutes before your scheduled appointment time.   If you have a lab appointment with the Cancer Center please come in thru the Main Entrance and check in at the main information desk.  You need to re-schedule your appointment should you arrive 10 or more minutes late.  We strive to give you quality time with our providers, and arriving late affects you and other patients whose appointments are after yours.  Also, if you no show three or more times for appointments you may be dismissed from the clinic at the providers discretion.     Again, thank you for choosing [facility].  Our hope is that these requests will decrease the amount of time that you wait before being seen by our physicians.       _____________________________________________________________  Should you have questions after your visit to [facility], please contact our office at [phone] between the hours of 8:00 a.m. and 4:30 p.m.  Voicemails left after 4:00 p.m. will not be returned until the following business day.  For prescription refill requests, have your pharmacy contact our office and allow 72 hours.    Due to Covid, you will need to wear a mask upon entering the hospital. If you do not have a mask, a mask will be given to you at the Main Entrance upon arrival. For doctor visits, patients may have 1 support person with them. For treatment visits, patients can not have anyone with them due to social distancing guidelines and our immunocompromised population.      

## 2019-11-01 NOTE — Progress Notes (Signed)
Per pt's daughter, pt is not taking calcium (MD order not to take).    Denied tooth, jaw, and leg pain.  No recent or upcoming dental visits.  Patient tolerated injection with no complaints.  Site clean and dry with no bruising or swelling noted at site.  Band aid applied.  VSS with discharge and left in a wheelchair with no s/s of distress noted.

## 2019-11-02 ENCOUNTER — Ambulatory Visit: Payer: Self-pay | Admitting: *Deleted

## 2019-11-07 DIAGNOSIS — E1142 Type 2 diabetes mellitus with diabetic polyneuropathy: Secondary | ICD-10-CM | POA: Diagnosis not present

## 2019-11-07 DIAGNOSIS — B351 Tinea unguium: Secondary | ICD-10-CM | POA: Diagnosis not present

## 2019-11-08 ENCOUNTER — Ambulatory Visit: Payer: Self-pay | Admitting: *Deleted

## 2019-11-10 ENCOUNTER — Encounter (HOSPITAL_BASED_OUTPATIENT_CLINIC_OR_DEPARTMENT_OTHER): Payer: Medicare Other | Admitting: Internal Medicine

## 2019-11-10 DIAGNOSIS — L97514 Non-pressure chronic ulcer of other part of right foot with necrosis of bone: Secondary | ICD-10-CM | POA: Diagnosis not present

## 2019-11-10 DIAGNOSIS — C61 Malignant neoplasm of prostate: Secondary | ICD-10-CM | POA: Diagnosis not present

## 2019-11-10 DIAGNOSIS — I10 Essential (primary) hypertension: Secondary | ICD-10-CM | POA: Diagnosis not present

## 2019-11-10 DIAGNOSIS — E1140 Type 2 diabetes mellitus with diabetic neuropathy, unspecified: Secondary | ICD-10-CM | POA: Diagnosis not present

## 2019-11-10 DIAGNOSIS — E11621 Type 2 diabetes mellitus with foot ulcer: Secondary | ICD-10-CM | POA: Diagnosis not present

## 2019-11-10 DIAGNOSIS — I4891 Unspecified atrial fibrillation: Secondary | ICD-10-CM | POA: Diagnosis not present

## 2019-11-10 NOTE — Progress Notes (Signed)
NORAH, FICK ([id number]) Visit Report for 11/10/2019 HPI Details Patient Name: Date of Service: [name] 11/10/2019 11:00 A M Medical Record Number: [id number] Patient Account Number: [id number] Date of Birth/Sex: Treating RN: 04-Sep-1923 (86 y.o. [name] Primary Care Provider: Sherrie Mustache Other Clinician: Referring Provider: Treating Provider/Extender: Deloria Lair in Treatment: 15 History of Present Illness HPI Description: ADMISSION 05/02/2019 This is a 86 year old man accompanied by his daughter. He lives in [place] with his elderly wife. He has home caregivers as well. Apparently on February 28, 2019 they noted blood on his sock and noticed a wound on the tip of his right great toe. He has been cared for by Dr. Posey Pronto who is his podiatrist in [place]. Apparently they have been using Betadine to this area. The patient has a thick mycotic nail on the right as well which is embedded in the wound in some areas. Fortunately the patient has diabetic neuropathy and is not really experiencing any pain otherwise I think this would all be quite painful. The patient was previously treated in 2019 for wounds on the left foot. This included a fairly thorough vascular review by vein and vascular Dr. Oneida Alar. His ABIs at that time were noncompressible bilaterally TBI on the right was 0.88 with biphasic waveforms on the right and monophasic waveforms at the left. He ended up having amputations of the left first and second toes. He did have an angiogram on 06/25/2017 which showed on the right the right common femoral profundofemoral for Morris and superficial femoral arteries were widely patent the right popliteal was widely patent. Tibial vessels were not well opacified secondary to motion artifact however he was felt to have all 3 tibial vessels intact with some calcification and some areas of stenosis within the posterior  tibial artery but relatively good flow to the right lower leg. He also had the left reviewed as well which was the source of the problem at that time. Past medical history; prostate cancer on oral antiandrogen therapy with bone mets, GI stromal tumor in 2005, type 2 diabetes with peripheral neuropathy, hypertension, atrial fibrillation on Eliquis, history of non-Hodgkin's lymphoma treated with CHOP in 2005. He had amputations of the left first and second toes secondary to osteomyelitis. Does not appear that he has had an x-ray of the right foot ABI in our clinic on the right was noncompressible [see full arterial discussion above]. 1/19; area on the tip of the right great toe under a very mycotic nail tip. We use silver alginate. 1/26; area of the tip of the right great toe under a very mycotic nail. A lot of this seems to have closed down we have been using silver alginate 2/9; the areas on the tip of the right great toe under a very mycotic nail. A lot of this continues to close down. I do not think there is a subungual problem. I am hopeful that this mycotic nail which is thick and raised will allow full epithelialization of the wound 2/23; this was a difficult area on the tip of his right great toe. Thick mycotic nail over the base of the wound. This appears to have closed down now. The nail itself is thick split with considerable subungual debris. READMISSION 07/25/2019 This is a 86 year old man that we cared for earlier this year in the clinic without a wound on the tip of his right great toe under a very mycotic toenail. He is a type II diabetic.  His ABIs have been noncompressible however he has been felt to have adequate blood flow in the right to heal the wound. We were able to get this wound to close over as I understand things he followed with Dr. Caprice Beaver had some debridement of the nail everything looks fine and apparently the wound bed was washed off by his wife over the weekend and by  Monday it was open. His daughter is able to show me pictures. Unfortunately this now has exposed bone raising the possibility that he has had underlying osteomyelitis all along. He has been using Silvadene cream on this he has been referred back to the clinic by Dr. Caprice Beaver. He had x-rays of the right foot that did not show evidence of osteomyelitis in the right foot. He has not been on antibiotics He also had an x-ray of the left foot that showed findings suspicious for osteomyelitis involving the residual base of the first proximal phalanx and the proximal phalanx of the left second toe as well as the left second metatarsal head however he has no wounds in this area. 4/12; culture from last time of bone in the right great toe was negative. The patient's wife apparently scrubbed this toe vigorously and according to his daughter reopen the wound however unfortunately it is all exposed bone last time. I removed some of this and we use Prisma unfortunately the area does not look much better today. Previous x-rays done in podiatry office Dr. Caprice Beaver did not show evidence of osteomyelitis. Nevertheless I think osteomyelitis here is probably quite likely. Still using Prisma 4/26; he took 2 weeks of doxycycline although his daughter is telling me he is not eating. He has loose bowel movements however I did not get the sense that this is changed all that much. He still has exposed bone. Culture I did last time showed a few coag negative staph I do not think that this is necessarily relevant. I still am operating under the premise that the patient probably has osteomyelitis. After considerable difficulty we are able to determine he is not allergic to cephalosporins I therefore gave him Keflex. 5/10; is tolerating Keflex 500 3 times daily quite well. He still has the small wound in the tip of his toe with exposed bone.Marland Kitchen Unfortunately he does not really have any granulation tissue. He saw Dr. Caprice Beaver of  podiatry in [place] last week I have not had a chance to look at his notes The patient had an extensive vascular work-up including an angiogram in 2019. He was felt to have adequate blood supply on the right at the time although he did have amputations of the left first and second toe by Dr. Oneida Alar. 09/11/19 -Patient is back and is about to finish his Keflex, appointment with Dr. Oneida Alar is in the works. The right great toe wound looks about the same, the left third toe dorsal wound has healed. There was a small eschar covering it that was removed by podiatry.According to patient's daughter the right great toe base on the plantar surface had some redness that seems to have disappeared. 10/13/2019; patient is back in clinic today after 1 month hiatus although I have not seen him since 5/10. Since he was last here he was admitted to hospital from 6/2 through 6/5 because of a nonhealing wound on his right great toe. He underwent an angiogram which showed patent common femoral, profunda, SFA above and below-knee popliteal artery. The dominant runoff in the right lower extremity was through the anterior  tibial but that became very small at the level of the ankle and had minimal outflow into the foot. The peroneal artery was patent but atretic. 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 Interventions were attempted on the posterior tibial. Mechanical orbital atherectomy down to the level of the ankle and then this was angioplastied with 2.3 mm and 3 mm balloons. Flow remained very sluggish down the posterior tibial artery but was patent. Post event there apparently was embolization of an area involving the plantar left heel and sides of the left heel. He arrives back in clinic today it has been almost 7 weeks since I have seen him. Miraculously the great toe area with open bone is  closed over probably as result of Dr. Ainsley Spinner interventions. He does however have an extensive ischemic area on the plantar and medial aspect of his heel. I reviewed Dr. Ainsley Spinner last note from 3 days ago on 6/22. He did not think there was enough inflow to heal the heel wound. With eschar and sloughing. Wondered whether he could heal a below-knee amputation but more likely an above the knee amputation when required. He was placed on Xeroform to the right heel In discussing things with the patient's family and the patient present he is not in a lot of pain. The area does not appear to be grossly infected. The patient appears to be comfortable and conversational. He is eating and drinking fairly well. 7/9; the patient saw Dr. Carlis Abbott on 6/29. He had previously done heroic attempt to establish blood flow as described above. 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 Although the eschar on the heel looks ominous both plantar and medial it is certainly no worse than last week. We will continue Betadine to this area with alginate. Ultimately this will separate off and we will see about the viability of the tissue underneath. Also in clinic today our nurses were washing his right great toe tip apparently some of the skin came off eschared and there is an open wound at the tip of the great toe with exposed bone similar to what he had previously 7/23; he has the open area on the tip of his first toe which I think is mostly bone. Then he has the embolic area on the plantar heel extending into the medial heel. We have been using Betadine and silver alginate to the heel silver alginate to the toe. His daughter brings up that she was given nitroglycerin patches by Dr. Donzetta Matters to try and open blood flow to the foot. She has not been using them out of concern of systemic hypotension and also that the labels said not  to put them on extremities Electronic Signature(s) Signed: 11/10/2019 5:20:32 PM By: Linton Ham MD Entered By: Linton Ham on 11/10/2019 12:02:27 -------------------------------------------------------------------------------- Physical Exam Details Patient Name: Date of Service: [name] 11/10/2019 11:00 A M Medical Record Number: [id number] Patient Account Number: [id number] Date of Birth/Sex: Treating RN: 12/21/1923 (86 y.o. [name] Primary Care Provider: Sherrie Mustache Other Clinician: Referring Provider: Treating Provider/Extender: Deloria Lair in Treatment: 15 Constitutional Patient is hypertensive.. Pulse regular and within target range for patient.Marland Kitchen Respirations regular, non-labored and within target range.. Temperature is normal and within the target range for the patient.Marland Kitchen Appears in no  distress. Cardiovascular Nonpalpable on the right. Notes Wound exam Debris over the surface of the toe. I did not debride this today but at some point perhaps an attempt to remove the tip of bone and give him enough tissue to close over. On the plantar aspect of the heel on the right this is less boggy using the Betadine but it is not separating at this point. I do not think there is any indication for debridement Electronic Signature(s) Signed: 11/10/2019 5:20:32 PM By: Linton Ham MD Entered By: Linton Ham on 11/10/2019 12:03:26 -------------------------------------------------------------------------------- Physician Orders Details Patient Name: Date of Service: [name] 11/10/2019 11:00 A M Medical Record Number: [id number] Patient Account Number: [id number] Date of Birth/Sex: Treating RN: [birth year] (86 y.o. [name] Primary Care Provider: Sherrie Mustache Other Clinician: Referring Provider: Treating Provider/Extender: Deloria Lair in Treatment: 15 Verbal / Phone  Orders: No Diagnosis Coding ICD-10 Coding Code Description E11.621 Type 2 diabetes mellitus with foot ulcer L97.514 Non-pressure chronic ulcer of other part of right foot with necrosis of bone E11.40 Type 2 diabetes mellitus with diabetic neuropathy, unspecified Follow-up Appointments Return appointment in 3 weeks. Dressing Change Frequency Wound #2 Right T Great oe Change dressing every day. - home health to change 3x/week and family to change on other days Wound #3 Right Calcaneus Change dressing every day. - home health to change 3x/week and family to change on other days Wound Cleansing Wound #2 Right T Great oe May shower and wash wound with soap and water. - on days that dressing is changed Wound #3 Right Calcaneus May shower and wash wound with soap and water. - on days that dressing is changed Primary Wound Dressing Wound #2 Right T Great oe Calcium Alginate with Silver Wound #3 Right Calcaneus lginate with Silver - over entire site Calcium A Other: - betadine to eschar sites Secondary Dressing Wound #3 Right Calcaneus Kerlix/Rolled Gauze Dry Gauze Heel [facility] skilled nursing for wound care. - [facility] Electronic Signature(s) Signed: 11/10/2019 5:20:32 PM By: Linton Ham MD Signed: 11/10/2019 5:51:32 PM By: [name] Entered By: [name] on 11/10/2019 11:52:05 -------------------------------------------------------------------------------- Problem List Details Patient Name: Date of Service: [name] 11/10/2019 11:00 A M Medical Record Number: [id number] Patient Account Number: [id number] Date of Birth/Sex: Treating RN: 06-21-1923 (86 y.o. [name] Primary Care Provider: Sherrie Mustache Other Clinician: Referring Provider: Treating Provider/Extender: Deloria Lair in Treatment: 15 Active Problems ICD-10 Encounter Code Description Active Date  MDM Diagnosis E11.621 Type 2 diabetes mellitus with foot ulcer 07/25/2019 No Yes L97.514 Non-pressure chronic ulcer of other part of right foot with necrosis of bone 07/25/2019 No Yes E11.40 Type 2 diabetes mellitus with diabetic neuropathy, unspecified 07/25/2019 No Yes Inactive Problems Resolved Problems Electronic Signature(s) Signed: 11/10/2019 5:20:32 PM By: Linton Ham MD Entered By: Linton Ham on 11/10/2019 12:00:58 -------------------------------------------------------------------------------- Progress Note Details Patient Name: Date of Service: [name] 11/10/2019 11:00 A M Medical Record Number: [id number] Patient Account Number: [id number] Date of Birth/Sex: Treating RN: December 11, 1923 (86 y.o. [name] Primary Care Provider: Sherrie Mustache Other Clinician: Referring Provider: Treating Provider/Extender: Deloria Lair in Treatment: 15 Subjective History of Present Illness (HPI) ADMISSION 05/02/2019 This is a 86 year old man accompanied by his daughter. He lives in [place] with his elderly wife. He has home caregivers as well. Apparently on February 28, 2019 they noted blood on his sock  and noticed a wound on the tip of his right great toe. He has been cared for by Dr. Posey Pronto who [place]. Apparently they have been using Betadine to this area. The patient has a thick mycotic nail on the right as well which is embedded in the wound in some areas. Fortunately the patient has diabetic neuropathy and is not really experiencing any pain otherwise I think this would all be quite painful. The patient was previously treated in 2019 for wounds on the left foot. This included a fairly thorough vascular review by vein and vascular Dr. Oneida Alar. His ABIs at that time were noncompressible bilaterally TBI on the right was 0.88 with biphasic waveforms on the right and monophasic waveforms at the left.  He ended up having amputations of the left first and second toes. He did have an angiogram on 06/25/2017 which showed on the right the right common femoral profundofemoral for Morris and superficial femoral arteries were widely patent the right popliteal was widely patent. Tibial vessels were not well opacified secondary to motion artifact however he was felt to have all 3 tibial vessels intact with some calcification and some areas of stenosis within the posterior tibial artery but relatively good flow to the right lower leg. He also had the left reviewed as well which was the source of the problem at that time. Past medical history; prostate cancer on oral antiandrogen therapy with bone mets, GI stromal tumor in 2005, type 2 diabetes with peripheral neuropathy, hypertension, atrial fibrillation on Eliquis, history of non-Hodgkin's lymphoma treated with CHOP in 2005. He had amputations of the left first and second toes secondary to osteomyelitis. Does not appear that he has had an x-ray of the right foot ABI in our clinic on the right was noncompressible [see full arterial discussion above]. 1/19; area on the tip of the right great toe under a very mycotic nail tip. We use silver alginate. 1/26; area of the tip of the right great toe under a very mycotic nail. A lot of this seems to have closed down we have been using silver alginate 2/9; the areas on the tip of the right great toe under a very mycotic nail. A lot of this continues to close down. I do not think there is a subungual problem. I am hopeful that this mycotic nail which is thick and raised will allow full epithelialization of the wound 2/23; this was a difficult area on the tip of his right great toe. Thick mycotic nail over the base of the wound. This appears to have closed down now. The nail itself is thick split with considerable subungual debris. READMISSION 07/25/2019 This is a 86 year old man that we cared for earlier this year in the  clinic without a wound on the tip of his right great toe under a very mycotic toenail. He is a type II diabetic. His ABIs have been noncompressible however he has been felt to have adequate blood flow in the right to heal the wound. We were able to get this wound to close over as I understand things he followed with Dr. Caprice Beaver had some debridement of the nail everything looks fine and apparently the wound bed was washed off by his wife over the weekend and by Monday it was open. His daughter is able to show me pictures. Unfortunately this now has exposed bone raising the possibility that he has had underlying osteomyelitis all along. He has been using Silvadene cream on this he has  been referred back to the clinic by Dr. Caprice Beaver. He had x-rays of the right foot that did not show evidence of osteomyelitis in the right foot. He has not been on antibiotics He also had an x-ray of the left foot that showed findings suspicious for osteomyelitis involving the residual base of the first proximal phalanx and the proximal phalanx of the left second toe as well as the left second metatarsal head however he has no wounds in this area. 4/12; culture from last time of bone in the right great toe was negative. The patient's wife apparently scrubbed this toe vigorously and according to his daughter reopen the wound however unfortunately it is all exposed bone last time. I removed some of this and we use Prisma unfortunately the area does not look much better today. Previous x-rays done in podiatry office Dr. Caprice Beaver did not show evidence of osteomyelitis. Nevertheless I think osteomyelitis here is probably quite likely. Still using Prisma 4/26; he took 2 weeks of doxycycline although his daughter is telling me he is not eating. He has loose bowel movements however I did not get the sense that this is changed all that much. He still has exposed bone. Culture I did last time showed a few coag negative staph I do not  think that this is necessarily relevant. I still am operating under the premise that the patient probably has osteomyelitis. After considerable difficulty we are able to determine he is not allergic to cephalosporins I therefore gave him Keflex. 5/10; is tolerating Keflex 500 3 times daily quite well. He still has the small wound in the tip of his toe with exposed bone.Marland Kitchen Unfortunately he does not really have any granulation tissue. He saw Dr. Caprice Beaver of podiatry in [place] last week I have not had a chance to look at his notes The patient had an extensive vascular work-up including an angiogram in 2019. He was felt to have adequate blood supply on the right at the time although he did have amputations of the left first and second toe by Dr. Oneida Alar. 09/11/19 -Patient is back and is about to finish his Keflex, appointment with Dr. Oneida Alar is in the works. The right great toe wound looks about the same, the left third toe dorsal wound has healed. There was a small eschar covering it that was removed by podiatry.According to patient's daughter the right great toe base on the plantar surface had some redness that seems to have disappeared. 10/13/2019; patient is back in clinic today after 1 month hiatus although I have not seen him since 5/10. Since he was last here he was admitted to hospital from 6/2 through 6/5 because of a nonhealing wound on his right great toe. He underwent an angiogram which showed patent common femoral, profunda, SFA above and below-knee popliteal artery. The dominant runoff in the right lower extremity was through the anterior tibial but that became very small at the level of the ankle and had minimal outflow into the foot. The peroneal artery was patent but atretic. 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 Interventions were attempted on the posterior  tibial. Mechanical orbital atherectomy down to the level of the ankle and then this was angioplastied with 2.3 mm and 3 mm balloons. Flow remained very sluggish down the posterior tibial artery but was patent. Post event there apparently was embolization of an area involving  the plantar left heel and sides of the left heel. He arrives back in clinic today it has been almost 7 weeks since I have seen him. Miraculously the great toe area with open bone is closed over probably as result of Dr. Ainsley Spinner interventions. He does however have an extensive ischemic area on the plantar and medial aspect of his heel. I reviewed Dr. Ainsley Spinner last note from 3 days ago on 6/22. He did not think there was enough inflow to heal the heel wound. With eschar and sloughing. Wondered whether he could heal a below-knee amputation but more likely an above the knee amputation when required. He was placed on Xeroform to the right heel In discussing things with the patient's family and the patient present he is not in a lot of pain. The area does not appear to be grossly infected. The patient appears to be comfortable and conversational. He is eating and drinking fairly well. 7/9; the patient saw Dr. Carlis Abbott on 6/29. He had previously done heroic attempt to establish blood flow as described above. 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 Although the eschar on the heel looks ominous both plantar and medial it is certainly no worse than last week. We will continue Betadine to this area with alginate. Ultimately this will separate off and we will see about the viability of the tissue underneath. Also in clinic today our nurses were washing his right great toe tip apparently some of the skin came off eschared and there is an open wound at the tip of the great toe with exposed bone similar to what he had previously 7/23; he  has the open area on the tip of his first toe which I think is mostly bone. Then he has the embolic area on the plantar heel extending into the medial heel. We have been using Betadine and silver alginate to the heel silver alginate to the toe. His daughter brings up that she was given nitroglycerin patches by Dr. Donzetta Matters to try and open blood flow to the foot. She has not been using them out of concern of systemic hypotension and also that the labels said not to put them on extremities Objective Constitutional Patient is hypertensive.. Pulse regular and within target range for patient.Marland Kitchen Respirations regular, non-labored and within target range.. Temperature is normal and within the target range for the patient.Marland Kitchen Appears in no distress. Vitals Time Taken: 11:45 AM, Height: 70 in, Weight: 140 lbs, BMI: 20.1, Temperature: 97.7 F, Pulse: 87 bpm, Respiratory Rate: 18 breaths/min, Blood Pressure: 182/82 mmHg. Cardiovascular Nonpalpable on the right. General Notes: Wound exam ooDebris over the surface of the toe. I did not debride this today but at some point perhaps an attempt to remove the tip of bone and give him enough tissue to close over. ooOn the plantar aspect of the heel on the right this is less boggy using the Betadine but it is not separating at this point. I do not think there is any indication for debridement Integumentary (Hair, Skin) Wound #2 status is Open. Original cause of wound was Gradually Appeared. The wound is located on the Right T Great. The wound measures 0.5cm length x oe 0.5cm width x 0.1cm depth; 0.196cm^2 area and 0.02cm^3 volume. There is no tunneling or undermining noted. There is a none present amount of drainage noted. The wound margin is distinct with the outline attached to the wound base. There is  no granulation within the wound bed. There is a large (67-100%) amount of necrotic tissue within the wound bed including Eschar. Wound #3 status is Open. Original cause  of wound was Gradually Appeared. The wound is located on the Right Calcaneus. The wound measures 6.8cm length x 7.1cm width x 0.2cm depth; 37.919cm^2 area and 7.584cm^3 volume. There is Fat Layer (Subcutaneous Tissue) Exposed exposed. There is no tunneling or undermining noted. There is a small amount of serosanguineous drainage noted. The wound margin is flat and intact. There is small (1-33%) pink granulation within the wound bed. There is a large (67-100%) amount of necrotic tissue within the wound bed including Eschar. Assessment Active Problems ICD-10 Type 2 diabetes mellitus with foot ulcer Non-pressure chronic ulcer of other part of right foot with necrosis of bone Type 2 diabetes mellitus with diabetic neuropathy, unspecified Plan Follow-up Appointments: Return appointment in 3 weeks. Dressing Change Frequency: Wound #2 Right T Great: oe Change dressing every day. - home health to change 3x/week and family to change on other days Wound #3 Right Calcaneus: Change dressing every day. - home health to change 3x/week and family to change on other days Wound Cleansing: Wound #2 Right T Great: oe May shower and wash wound with soap and water. - on days that dressing is changed Wound #3 Right Calcaneus: May shower and wash wound with soap and water. - on days that dressing is changed Primary Wound Dressing: Wound #2 Right T Great: oe Calcium Alginate with Silver Wound #3 Right Calcaneus: Calcium Alginate with Silver - over entire site Other: - betadine to eschar sites Secondary Dressing: Wound #3 Right Calcaneus: Kerlix/Rolled Gauze Dry Gauze Heel Cup Home Health: [facility] for wound care. - [facility]1 I have not change the dressing silver alginate to the tip of the right great toe. I may try and debridement at some point here but not currently. This is the reason I put the nitroglycerin patch over the dorsalis pedis artery 2. I am doubtful that  the nitroglycerin patch will cause systemic symptoms and I have never been really all that impressed with the wound healing that this causes however it is worth a try in this man who has no other revascularization options 3. The heel looks better less boggy but still not separating. No debridement is indicated here Electronic Signature(s) Signed: 11/10/2019 5:20:32 PM By: Linton Ham MD Entered By: Linton Ham on 11/10/2019 12:04:38 -------------------------------------------------------------------------------- SuperBill Details Patient Name: Date of Service: [name] 11/10/2019 Medical Record Number: [id number] Patient Account Number: [id number] Date of Birth/Sex: Treating RN: Aug 16, 1923 (86 y.o. [name] Primary Care Provider: Sherrie Mustache Other Clinician: Referring Provider: Treating Provider/Extender: Deloria Lair in Treatment: 15 Diagnosis Coding ICD-10 Codes Code Description E11.621 Type 2 diabetes mellitus with foot ulcer L97.514 Non-pressure chronic ulcer of other part of right foot with necrosis of bone E11.40 Type 2 diabetes mellitus with diabetic neuropathy, unspecified Facility Procedures CPT4 Code: 99833825 Description: 99214 - WOUND CARE VISIT-LEV 4 EST PT Modifier: Quantity: 1 Physician Procedures : CPT4 Code Description Modifier [id number] - WC PHYS LEVEL 3 - EST PT ICD-10 Diagnosis Description E11.621 Type 2 diabetes mellitus with foot ulcer L97.514 Non-pressure chronic ulcer of other part of right foot with necrosis of bone E11.40 Type 2  diabetes mellitus with diabetic neuropathy, unspecified Quantity: 1 Electronic Signature(s) Signed: 11/10/2019 5:20:32 PM By: Linton Ham MD Entered By: Linton Ham on 11/10/2019 12:05:01

## 2019-11-10 NOTE — Progress Notes (Signed)
ALEXZANDER, DOLINGER ([id number]) Visit Report for 11/10/2019 Arrival Information Details Patient Name: Date of Service: [name] 11/10/2019 11:00 A M Medical Record Number: [id number] Patient Account Number: [id number] Date of Birth/Sex: Treating RN: 05/23/1923 (86 y.o. [name] Primary Care Shadi Larner: Sherrie Mustache Other Clinician: Referring Lamonda Noxon: Treating Suzan Manon/Extender: Deloria Lair in Treatment: 15 Visit Information History Since Last Visit Added or deleted any medications: No Patient Arrived: Wheel Chair Any new allergies or adverse reactions: No Arrival Time: 11:42 Had a fall or experienced change in No Accompanied By: daughter and caregiver activities of daily living that may affect Transfer Assistance: None risk of falls: Patient Identification Verified: Yes Signs or symptoms of abuse/neglect since last visito No Secondary Verification Process Completed: Yes Hospitalized since last visit: No Patient Requires Transmission-Based Precautions: No Implantable device outside of the clinic excluding No Patient Has Alerts: Yes cellular tissue based products placed in the center Patient Alerts: R ABI non compressible since last visit: Has Dressing in Place as Prescribed: Yes Pain Present Now: No Electronic Signature(s) Signed: 11/10/2019 5:51:32 PM By: [name] Entered By: [name] on 11/10/2019 11:45:44 -------------------------------------------------------------------------------- Clinic Level of Care Assessment Details Patient Name: Date of Service: [name] 11/10/2019 11:00 A M Medical Record Number: [id number] Patient Account Number: [id number] Date of Birth/Sex: Treating RN: 08/04/1923 (86 y.o. [name] Primary Care [name]: Sherrie Mustache Other Clinician: Referring Odilon Cass: Treating Layal Javid/Extender: Deloria Lair in Treatment: 15 Clinic Level of  Care Assessment Items TOOL 4 Quantity Score X- 1 0 Use when only an EandM is performed on FOLLOW-UP visit ASSESSMENTS - Nursing Assessment / Reassessment X- 1 10 Reassessment of Co-morbidities (includes updates in patient status) X- 1 5 Reassessment of Adherence to Treatment Plan ASSESSMENTS - Wound and Skin A ssessment / Reassessment []  - 0 Simple Wound Assessment / Reassessment - one wound X- 2 5 Complex Wound Assessment / Reassessment - multiple wounds []  - 0 Dermatologic / Skin Assessment (not related to wound area) ASSESSMENTS - Focused Assessment X- 1 5 Circumferential Edema Measurements - multi extremities []  - 0 Nutritional Assessment / Counseling / Intervention []  - 0 Lower Extremity Assessment (monofilament, tuning fork, pulses) []  - 0 Peripheral Arterial Disease Assessment (using hand held doppler) ASSESSMENTS - Ostomy and/or Continence Assessment and Care []  - 0 Incontinence Assessment and Management []  - 0 Ostomy Care Assessment and Management (repouching, etc.) PROCESS - Coordination of Care X - Simple Patient / Family Education for ongoing care 1 15 []  - 0 Complex (extensive) Patient / Family Education for ongoing care X- 1 10 Staff obtains Programmer, systems, Records, T Results / Process Orders est X- 1 10 Staff telephones HHA, Nursing Homes / Clarify orders / etc []  - 0 Routine Transfer to another Facility (non-emergent condition) []  - 0 Routine Hospital Admission (non-emergent condition) []  - 0 New Admissions / Biomedical engineer / Ordering NPWT Apligraf, etc. , []  - 0 Emergency Hospital Admission (emergent condition) X- 1 10 Simple Discharge Coordination []  - 0 Complex (extensive) Discharge Coordination PROCESS - Special Needs []  - 0 Pediatric / Minor Patient Management []  - 0 Isolation Patient Management []  - 0 Hearing / Language / Visual special needs []  - 0 Assessment of Community assistance (transportation, D/C planning, etc.) []  -  0 Additional assistance / Altered mentation []  - 0 Support Surface(s) Assessment (bed, cushion, seat, etc.) INTERVENTIONS - Wound Cleansing / Measurement []  - 0 Simple Wound Cleansing - one wound  X- 2 5 Complex Wound Cleansing - multiple wounds X- 1 5 Wound Imaging (photographs - any number of wounds) []  - 0 Wound Tracing (instead of photographs) []  - 0 Simple Wound Measurement - one wound X- 2 5 Complex Wound Measurement - multiple wounds INTERVENTIONS - Wound Dressings X - Small Wound Dressing one or multiple wounds 2 10 []  - 0 Medium Wound Dressing one or multiple wounds []  - 0 Large Wound Dressing one or multiple wounds X- 1 5 Application of Medications - topical []  - 0 Application of Medications - injection INTERVENTIONS - Miscellaneous []  - 0 External ear exam []  - 0 Specimen Collection (cultures, biopsies, blood, body fluids, etc.) []  - 0 Specimen(s) / Culture(s) sent or taken to Lab for analysis []  - 0 Patient Transfer (multiple staff / Civil Service fast streamer / Similar devices) []  - 0 Simple Staple / Suture removal (25 or less) []  - 0 Complex Staple / Suture removal (26 or more) []  - 0 Hypo / Hyperglycemic Management (close monitor of Blood Glucose) []  - 0 Ankle / Brachial Index (ABI) - do not check if billed separately X- 1 5 Vital Signs Has the patient been seen at the hospital within the last three years: Yes Total Score: 130 Level Of Care: New/Established - Level 4 Electronic Signature(s) Signed: 11/10/2019 5:51:32 PM By: [name] Entered By: [name] on 11/10/2019 11:53:31 -------------------------------------------------------------------------------- Encounter Discharge Information Details Patient Name: Date of Service: [name] 11/10/2019 11:00 A M Medical Record Number: [id number] Patient Account Number: [id number] Date of Birth/Sex: Treating RN: 05/20/1923 (86 y.o. [name] Primary Care Eleonore Shippee: Sherrie Mustache Other  Clinician: Referring Genavie Boettger: Treating Hershell Brandl/Extender: Deloria Lair in Treatment: 15 Encounter Discharge Information Items Discharge Condition: Stable Ambulatory Status: Wheelchair Discharge Destination: Home Transportation: Private Auto Accompanied By: spouse, caregiver Schedule Follow-up Appointment: Yes Clinical Summary of Care: Patient Declined Electronic Signature(s) Signed: 11/10/2019 5:49:13 PM By: Baruch Gouty RN, BSN Entered By: Baruch Gouty on 11/10/2019 17:47:51 -------------------------------------------------------------------------------- Lower Extremity Assessment Details Patient Name: Date of Service: [name] 11/10/2019 11:00 A M Medical Record Number: [id number] Patient Account Number: [id number] Date of Birth/Sex: Treating RN: Dec 15, 1923 (86 y.o. [name] Primary Care Hoy Fallert: Sherrie Mustache Other Clinician: Referring Lovel Suazo: Treating Toi Stelly/Extender: Deloria Lair in Treatment: 15 Edema Assessment Assessed: Shirlyn Goltz: No] Patrice Paradise: No] Edema: [Left: N] [Right: o] Calf Left: Right: Point of Measurement: cm From Medial Instep cm 29.5 cm Ankle Left: Right: Point of Measurement: cm From Medial Instep cm 23.5 cm Vascular Assessment Pulses: Dorsalis Pedis Palpable: [Right:No] Electronic Signature(s) Signed: 11/10/2019 5:51:32 PM By: [name] Entered By: [name] on 11/10/2019 11:46:26 -------------------------------------------------------------------------------- Multi Wound Chart Details Patient Name: Date of Service: [name] 11/10/2019 11:00 A M Medical Record Number: [id number] Patient Account Number: [id number] Date of Birth/Sex: Treating RN: 07-09-1923 (86 y.o. [name] Primary Care Liahm Grivas: Sherrie Mustache Other Clinician: Referring Bennette Hasty: Treating Deniz Eskridge/Extender: Deloria Lair in Treatment:  15 Vital Signs Height(in): 55 Pulse(bpm): 56 Weight(lbs): 140 Blood Pressure(mmHg): 182/82 Body Mass Index(BMI): 20 Temperature(F): 97.7 Respiratory Rate(breaths/min): 18 Photos: [2:No Photos Right T Great oe] [3:No Photos Right Calcaneus] [N/A:N/A N/A] Wound Location: [2:Gradually Appeared] [3:Gradually Appeared] [N/A:N/A] Wounding Event: [2:Diabetic Wound/Ulcer of the Lower] [3:Diabetic Wound/Ulcer of the Lower] [N/A:N/A] Primary Etiology: [2:Extremity N/A] [3:Extremity Arterial Insufficiency Ulcer] [N/A:N/A] Secondary Etiology: [2:Cataracts, Arrhythmia, Hypertension,] [3:Cataracts, Arrhythmia, Hypertension,] [N/A:N/A] Comorbid History: [2:Peripheral Arterial Disease, Type II Diabetes, Osteomyelitis, Dementia,  Neuropathy, Received Chemotherapy 06/15/2019] [3:Peripheral Arterial Disease, Type II Diabetes, Osteomyelitis, Dementia, Neuropathy, Received Chemotherapy  09/21/2019] [N/A:N/A] Date Acquired: [2:15] [3:4] [N/A:N/A] Weeks of Treatment: [2:Open] [3:Open] [N/A:N/A] Wound Status: [2:0.5x0.5x0.1] [3:6.8x7.1x0.2] [N/A:N/A] Measurements L x W x D (cm) [2:0.196] [3:37.919] [N/A:N/A] A (cm) : rea [2:0.02] [3:7.584] [N/A:N/A] Volume (cm) : [2:-108.50%] [3:11.60%] [N/A:N/A] % Reduction in A rea: [2:28.60%] [3:-76.90%] [N/A:N/A] % Reduction in Volume: [2:Grade 2] [3:Unable to visualize wound bed] [N/A:N/A] Classification: [2:None Present] [3:Small] [N/A:N/A] Exudate A mount: [2:N/A] [3:Serosanguineous] [N/A:N/A] Exudate Type: [2:N/A] [3:red, brown] [N/A:N/A] Exudate Color: [2:Distinct, outline attached] [3:Flat and Intact] [N/A:N/A] Wound Margin: [2:None Present (0%)] [3:Small (1-33%)] [N/A:N/A] Granulation A mount: [2:N/A] [3:Pink] [N/A:N/A] Granulation Quality: [2:Large (67-100%)] [3:Large (67-100%)] [N/A:N/A] Necrotic A mount: [2:Eschar] [3:Eschar] [N/A:N/A] Necrotic Tissue: [2:Fascia: No] [3:Fat Layer (Subcutaneous Tissue)] [N/A:N/A] Exposed Structures: [2:Fat Layer  (Subcutaneous Tissue) Exposed: No Tendon: No Muscle: No Joint: No Bone: No Small (1-33%)] [3:Exposed: Yes Fascia: No Tendon: No Muscle: No Joint: No Bone: No Medium (34-66%)] [N/A:N/A] Treatment Notes Electronic Signature(s) Signed: 11/10/2019 5:20:32 PM By: Linton Ham MD Signed: 11/10/2019 5:51:32 PM By: [name] Entered By: Linton Ham on 11/10/2019 12:01:07 -------------------------------------------------------------------------------- Multi-Disciplinary Care Plan Details Patient Name: Date of Service: Lester Kinsman NK E. 11/10/2019 11:00 A M Medical Record Number: [id number] Patient Account Number: [id number] Date of Birth/Sex: Treating RN: October 15, 1923 (86 y.o. [name] Primary Care Jermia Rigsby: Sherrie Mustache Other Clinician: Referring Chidi Shirer: Treating Gaylon Bentz/Extender: Deloria Lair in Treatment: 15 Active Inactive Wound/Skin Impairment Nursing Diagnoses: Knowledge deficit related to ulceration/compromised skin integrity Goals: Patient/caregiver will verbalize understanding of skin care regimen Date Initiated: 07/25/2019 Target Resolution Date: 12/08/2019 Goal Status: Active Ulcer/skin breakdown will have a volume reduction of 30% by week 4 Date Initiated: 07/25/2019 Date Inactivated: 08/28/2019 Target Resolution Date: 08/25/2019 Goal Status: Unmet Unmet Reason: Osteomyelitis Interventions: Assess patient/caregiver ability to obtain necessary supplies Assess patient/caregiver ability to perform ulcer/skin care regimen upon admission and as needed Assess ulceration(s) every visit Notes: Electronic Signature(s) Signed: 11/10/2019 5:51:32 PM By: [name] Entered By: [name] on 11/10/2019 11:52:25 -------------------------------------------------------------------------------- Pain Assessment Details Patient Name: Date of Service: [name] 11/10/2019 11:00 A M Medical Record Number:  [id number] Patient Account Number: [id number] Date of Birth/Sex: Treating RN: 1924/03/14 (86 y.o. [name] Primary Care Polette Nofsinger: Sherrie Mustache Other Clinician: Referring Anaeli Cornwall: Treating Natasha Burda/Extender: Deloria Lair in Treatment: 15 Active Problems Location of Pain Severity and Description of Pain Patient Has Paino No Site Locations Pain Management and Medication Current Pain Management: Electronic Signature(s) Signed: 11/10/2019 5:51:32 PM By: [name] Entered By: [name] on 11/10/2019 33:82:50 -------------------------------------------------------------------------------- Patient/Caregiver Education Details Patient Name: Date of Service: MA RTIN, FRA NK E. 7/23/2021andnbsp11:00 A M Medical Record Number: [id number] Patient Account Number: [id number] Date of Birth/Gender: Treating RN: [birth year] (86 y.o. [name] Primary Care Physician: Sherrie Mustache Other Clinician: Referring Physician: Treating Physician/Extender: Deloria Lair in Treatment: 15 Education Assessment Education Provided To: Patient Education Topics Provided Wound/Skin Impairment: Handouts: Caring for Your Ulcer Methods: Explain/Verbal Responses: State content correctly Electronic Signature(s) Signed: 11/10/2019 5:51:32 PM By: [name] Entered By: [name] on 11/10/2019 11:52:54 -------------------------------------------------------------------------------- Wound Assessment Details Patient Name: Date of Service: [name] 11/10/2019 11:00 A M Medical Record Number: [id number] Patient Account Number: [id number] Date of Birth/Sex: Treating RN: 12-19-1923 (86 y.o. [name] Primary Care Karlea Mckibbin: Sherrie Mustache Other Clinician: Referring Danaye Sobh: Treating Joseth Weigel/Extender: Deloria Lair  in Treatment: 15 Wound Status Wound Number:  2 Primary Diabetic Wound/Ulcer of the Lower Extremity Etiology: Wound Location: Right T Great oe Wound Open Wounding Event: Gradually Appeared Status: Date Acquired: 06/15/2019 Comorbid Cataracts, Arrhythmia, Hypertension, Peripheral Arterial Disease, Weeks Of Treatment: 15 History: Type II Diabetes, Osteomyelitis, Dementia, Neuropathy, Received Clustered Wound: No Chemotherapy Photos Photo Uploaded By: Mikeal Hawthorne on 11/10/2019 13:42:03 Wound Measurements Length: (cm) 0.5 Width: (cm) 0.5 Depth: (cm) 0.1 Area: (cm) 0.196 Volume: (cm) 0.02 % Reduction in Area: -108.5% % Reduction in Volume: 28.6% Epithelialization: Small (1-33%) Tunneling: No Undermining: No Wound Description Classification: Grade 2 Wound Margin: Distinct, outline attached Exudate Amount: None Present Foul Odor After Cleansing: No Slough/Fibrino No Wound Bed Granulation Amount: None Present (0%) Exposed Structure Necrotic Amount: Large (67-100%) Fascia Exposed: No Necrotic Quality: Eschar Fat Layer (Subcutaneous Tissue) Exposed: No Tendon Exposed: No Muscle Exposed: No Joint Exposed: No Bone Exposed: No Treatment Notes Wound #2 (Right Toe Great) 3. Primary Dressing Applied Calcium Alginate Ag 4. Secondary Dressing Roll Gauze Electronic Signature(s) Signed: 11/10/2019 5:51:32 PM By: [name] Entered By: [name] on 11/10/2019 11:47:55 -------------------------------------------------------------------------------- Wound Assessment Details Patient Name: Date of Service: [name] 11/10/2019 11:00 A M Medical Record Number: [id number] Patient Account Number: [id number] Date of Birth/Sex: Treating RN: 03-21-1924 (86 y.o. [name] Primary Care Kaileah Shevchenko: Sherrie Mustache Other Clinician: Referring Emani Morad: Treating Mardy Hoppe/Extender: Deloria Lair in Treatment: 15 Wound Status Wound Number: 3 Primary Diabetic Wound/Ulcer of the  Lower Extremity Etiology: Wound Location: Right Calcaneus Secondary Arterial Insufficiency Ulcer Wounding Event: Gradually Appeared Etiology: Date Acquired: 09/21/2019 Wound Open Weeks Of Treatment: 4 Status: Clustered Wound: No Comorbid Cataracts, Arrhythmia, Hypertension, Peripheral Arterial Disease, History: Type II Diabetes, Osteomyelitis, Dementia, Neuropathy, Received Chemotherapy Photos Photo Uploaded By: Mikeal Hawthorne on 11/10/2019 13:42:04 Wound Measurements Length: (cm) 6.8 Width: (cm) 7.1 Depth: (cm) 0.2 Area: (cm) 37.919 Volume: (cm) 7.584 % Reduction in Area: 11.6% % Reduction in Volume: -76.9% Epithelialization: Medium (34-66%) Tunneling: No Undermining: No Wound Description Classification: Unable to visualize wound bed Wound Margin: Flat and Intact Exudate Amount: Small Exudate Type: Serosanguineous Exudate Color: red, brown Foul Odor After Cleansing: No Slough/Fibrino No Wound Bed Granulation Amount: Small (1-33%) Exposed Structure Granulation Quality: Pink Fascia Exposed: No Necrotic Amount: Large (67-100%) Fat Layer (Subcutaneous Tissue) Exposed: Yes Necrotic Quality: Eschar Tendon Exposed: No Muscle Exposed: No Joint Exposed: No Bone Exposed: No Treatment Notes Wound #3 (Right Calcaneus) 3. Primary Dressing Applied Calcium Alginate Ag 4. Secondary Dressing Dry Gauze Roll Gauze Heel Cup Notes paint with betadine to eschar sites. netting Electronic Signature(s) Signed: 11/10/2019 5:51:32 PM By: [name] Entered By: [name] on 11/10/2019 11:49:20 -------------------------------------------------------------------------------- Vitals Details Patient Name: Date of Service: [name] 11/10/2019 11:00 A M Medical Record Number: [id number] Patient Account Number: [id number] Date of Birth/Sex: Treating RN: 11/19/1923 (86 y.o. [name] Primary Care Yahira Timberman: Sherrie Mustache Other Clinician: Referring  Deaun Rocha: Treating Klyn Kroening/Extender: Deloria Lair in Treatment: 15 Vital Signs Time Taken: 11:45 Temperature (F): 97.7 Height (in): 70 Pulse (bpm): 87 Weight (lbs): 140 Respiratory Rate (breaths/min): 18 Body Mass Index (BMI): 20.1 Blood Pressure (mmHg): 182/82 Reference Range: 80 - 120 mg / dl Electronic Signature(s) Signed: 11/10/2019 5:51:32 PM By: [name] Entered By: [name] on 11/10/2019 11:46:13

## 2019-11-13 DIAGNOSIS — E1142 Type 2 diabetes mellitus with diabetic polyneuropathy: Secondary | ICD-10-CM | POA: Diagnosis not present

## 2019-11-13 DIAGNOSIS — E11621 Type 2 diabetes mellitus with foot ulcer: Secondary | ICD-10-CM | POA: Diagnosis not present

## 2019-11-13 DIAGNOSIS — C8581 Other specified types of non-Hodgkin lymphoma, lymph nodes of head, face, and neck: Secondary | ICD-10-CM | POA: Diagnosis not present

## 2019-11-13 DIAGNOSIS — C61 Malignant neoplasm of prostate: Secondary | ICD-10-CM | POA: Diagnosis not present

## 2019-11-13 DIAGNOSIS — L97514 Non-pressure chronic ulcer of other part of right foot with necrosis of bone: Secondary | ICD-10-CM | POA: Diagnosis not present

## 2019-11-13 DIAGNOSIS — L97522 Non-pressure chronic ulcer of other part of left foot with fat layer exposed: Secondary | ICD-10-CM | POA: Diagnosis not present

## 2019-11-14 ENCOUNTER — Other Ambulatory Visit: Payer: Self-pay | Admitting: *Deleted

## 2019-11-14 DIAGNOSIS — Z7984 Long term (current) use of oral hypoglycemic drugs: Secondary | ICD-10-CM | POA: Diagnosis not present

## 2019-11-14 DIAGNOSIS — Z89422 Acquired absence of other left toe(s): Secondary | ICD-10-CM | POA: Diagnosis not present

## 2019-11-14 DIAGNOSIS — I10 Essential (primary) hypertension: Secondary | ICD-10-CM | POA: Diagnosis not present

## 2019-11-14 DIAGNOSIS — L89152 Pressure ulcer of sacral region, stage 2: Secondary | ICD-10-CM | POA: Diagnosis not present

## 2019-11-14 DIAGNOSIS — E1151 Type 2 diabetes mellitus with diabetic peripheral angiopathy without gangrene: Secondary | ICD-10-CM | POA: Diagnosis not present

## 2019-11-14 DIAGNOSIS — C61 Malignant neoplasm of prostate: Secondary | ICD-10-CM | POA: Diagnosis not present

## 2019-11-14 DIAGNOSIS — E1142 Type 2 diabetes mellitus with diabetic polyneuropathy: Secondary | ICD-10-CM | POA: Diagnosis not present

## 2019-11-14 DIAGNOSIS — I4821 Permanent atrial fibrillation: Secondary | ICD-10-CM | POA: Diagnosis not present

## 2019-11-14 DIAGNOSIS — E11621 Type 2 diabetes mellitus with foot ulcer: Secondary | ICD-10-CM | POA: Diagnosis not present

## 2019-11-14 DIAGNOSIS — L97514 Non-pressure chronic ulcer of other part of right foot with necrosis of bone: Secondary | ICD-10-CM | POA: Diagnosis not present

## 2019-11-14 DIAGNOSIS — C8581 Other specified types of non-Hodgkin lymphoma, lymph nodes of head, face, and neck: Secondary | ICD-10-CM | POA: Diagnosis not present

## 2019-11-14 DIAGNOSIS — Z7901 Long term (current) use of anticoagulants: Secondary | ICD-10-CM | POA: Diagnosis not present

## 2019-11-14 NOTE — Patient Outreach (Signed)
[facility]) Care Management  [place]  11/14/2019   [name] 1924/04/18 [id number]  Subjective: Telephone call to patient's home / mobile number, spoke with patient's daughter/ designated party release [name]), stated patient's name, date of birth, and address.   States patient and family are trying to deal with patient's right foot issues, issues are severely impacting everything, pain is being managed with medications, and continues to receive palliative care services.  Daughter states patient has attended all follow up appointments (per chart review, vascular surgeon appointment on :10/31/2019, 10/17/2019, 10/10/2019,  Wound MD appointments on: 11/10/2019, 10/27/2019, 10/13/2019), oncologist / infusion on 11/01/2019) since last outreach with this RNCM and appointments have gone okay.  States patient has the following upcoming appointments: with primary provider on 11/27/2019, labs with oncologist on 11/27/2019, with wound MD on 11/28/2019, with vascular MD on 11/28/2019 (planning to reschedule due schedule conflict with other appointment), and infusion on 11/29/2019.    Daughter states patient does not have any education material, EMMI follow up, care coordination, care management, disease monitoring, transportation, community resource, or pharmacy needs at this time.  Daughter is aware 30 day EMMI automated call follow up completed.  States she is very appreciative of the follow up, is in agreement  to receive 1 additional follow up call to assess for further CM needs, after provider follow up visits, and will notify RNCM if assistance needed prior to next outreach.     Objective:  Per KPN (Knowledge Performance Now, point of care tool) and chart review, patient hospitalized 09/20/2019 - 09/23/2019 for  Vagal reaction, orthostatic hypotension, PAD, nonhealing wound of right great, status post Ultrasound-guided access of left common femoral artery, Aortogram including catheter  selection of aorta, Bilateral lower extremity arteriogram including selection of third order branches in the right lower extremity, Right posterior tibial artery mechanical orbital atherectomy, Right posterior tibial artery angioplasty, Mynx closure of the left common femoral artery.     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    Encounter Medications:  Outpatient Encounter Medications as of 11/14/2019  Medication Sig Note  . abiraterone acetate (ZYTIGA) 250 MG tablet Take 1 tablet (250 mg total) by mouth daily before breakfast. Take on an empty stomach 1 hour before or 2 hours after a meal   . acetaminophen (TYLENOL) 500 MG tablet Take 1 tablet (500 mg total) by mouth 3 (three) times daily.   Marland Kitchen apixaban (ELIQUIS) 5 MG TABS tablet Take 5 mg by mouth 2 (two) times daily.   . blood glucose meter kit and supplies Dispense based on patient and insurance preference. Use four times daily as directed. (FOR ICD-10 E10.9, E11.9).   . Calcium Polycarbophil (FIBER-CAPS PO) Take 2-3 capsules by mouth See admin instructions. Take 3 capsules by mouth in the morning & take 2 capsules by mouth in the evening.   . Cholecalciferol (VITAMIN D3) 1000 units CAPS Take 1,000 Units by mouth daily.    . clopidogrel (PLAVIX) 75 MG tablet Take 1 tablet (75 mg total) by mouth daily.   Marland Kitchen  clotrimazole-betamethasone  (LOTRISONE) cream Apply 1 application topically 2 (two) times daily as needed (skin irriation.).    Marland Kitchen Cranberry 500 MG TABS Take 500 mg by mouth daily.    Marland Kitchen denosumab (XGEVA) 120 MG/1.7ML SOLN injection Inject 120 mg into the skin every 30 (thirty) days.   . diclofenac Sodium (VOLTAREN) 1 % GEL Apply 2 g topically 4 (four) times daily.   Marland Kitchen diltiazem (CARDIZEM) 60 MG tablet Take 60 mg by mouth 2 (two) times daily.   Marland Kitchen donepezil (ARICEPT) 10 MG tablet Take 1 tablet by mouth once daily   . feeding supplement, ENSURE ENLIVE, (ENSURE ENLIVE) LIQD Take 237 mLs by mouth daily. CHOCOLATE    . furosemide (LASIX) 20 MG tablet Take 20 mg by mouth daily as needed (retention/swelling.).    Marland Kitchen glucose blood (EASYMAX TEST) test strip Use as instructed   . Lancets 28G MISC 1 kit by Does not apply route every morning.   Marland Kitchen Leuprolide Acetate, 6 Month, (LUPRON DEPOT, 78-MONTH,) 45 MG injection Inject 45 mg into the muscle every 6 (six) months.   . metFORMIN (GLUCOPHAGE) 850 MG tablet Take 850 mg by mouth 2 (two) times daily with a meal.    . mirtazapine (REMERON) 15 MG tablet Take 1 tablet (15 mg total) by mouth at bedtime.   . Multiple Vitamin (MULTIVITAMIN WITH MINERALS) TABS tablet Take 1 tablet by mouth daily.    . nitroGLYCERIN (NITRODUR - DOSED IN MG/24 HR) 0.2 mg/hr patch Place 1 patch (0.2 mg total) onto the skin daily.   . predniSONE (DELTASONE) 2.5 MG tablet Take 2.5 mg by mouth 2 (two) times daily with a meal.   . Probiotic Product (PROBIOTIC DAILY PO) Take 1 tablet by mouth daily. Every morning   . psyllium (METAMUCIL) 58.6 % packet Take 1 packet by mouth daily.   . traMADol (ULTRAM) 50 MG tablet Take 1 tablet (50 mg total) by mouth every 6 (six) hours as needed for moderate pain.   Marland Kitchen trimethoprim (TRIMPEX) 100 MG tablet Take 1 tablet (100 mg total) by mouth at bedtime.   . fluticasone (FLONASE) 50 MCG/ACT nasal spray Place 2 sprays into both nostrils daily as needed (allergies.).  (Patient not taking:  Reported on 11/14/2019) 11/14/2019: Daughter states he has not needed.    No facility-administered encounter medications on file as of 11/14/2019.    Functional Status:  In your present state of health, do you have any difficulty performing the following activities: 09/21/2019 09/20/2019  Hearing? - Y  Vision? - N  Difficulty concentrating or making decisions? - Y  Walking or climbing stairs? - Y  Dressing or bathing? - Y  Doing errands, shopping? N -  Some recent data might be hidden    Fall/Depression Screening: Fall Risk  10/06/2019 08/25/2019 02/22/2019  Falls in the past year? 1 1 Exclusion - non ambulatory  Number falls in past yr: 1 1 -  Injury with Fall? 0 0 -  Risk Factor Category  - - -  Risk for fall due to : History of fall(s);Impaired balance/gait History of fall(s) -  Follow up - - -   PHQ 2/9 Scores 02/22/2019 08/01/2018 02/09/2018 03/13/2014  PHQ - 2 Score 0 0 0 0    Assessment: Received Medicare EMMI General Discharge Red Flag Alert follow up referral on 09/29/2019.  Red Flag Alert Triggers, times 4, Da # 4, patient answered no to the following questions: Know who to call about changes in condition?  Wounds healing well?  Patient answered yes to the following questions: Lost interest in things?  Sad/hopeless/anxious/empty?      EMMI follow up completed and will follow up to assess further care management needs.       Plan: RNCM will call patient's daughter / designated party release  for telephone outreach attempt, within 30 business days, EMMI follow up, to assess for further CM needs, and proceed with case closure, after 4th unsuccessful outreach call.      Darlene H. Annia Friendly, BSN, [place] Telephonic CM Phone: [phone] Fax: [phone]

## 2019-11-17 DIAGNOSIS — C61 Malignant neoplasm of prostate: Secondary | ICD-10-CM | POA: Diagnosis not present

## 2019-11-17 DIAGNOSIS — L97514 Non-pressure chronic ulcer of other part of right foot with necrosis of bone: Secondary | ICD-10-CM | POA: Diagnosis not present

## 2019-11-17 DIAGNOSIS — C8581 Other specified types of non-Hodgkin lymphoma, lymph nodes of head, face, and neck: Secondary | ICD-10-CM | POA: Diagnosis not present

## 2019-11-17 DIAGNOSIS — E11621 Type 2 diabetes mellitus with foot ulcer: Secondary | ICD-10-CM | POA: Diagnosis not present

## 2019-11-17 DIAGNOSIS — E1151 Type 2 diabetes mellitus with diabetic peripheral angiopathy without gangrene: Secondary | ICD-10-CM | POA: Diagnosis not present

## 2019-11-17 DIAGNOSIS — E1142 Type 2 diabetes mellitus with diabetic polyneuropathy: Secondary | ICD-10-CM | POA: Diagnosis not present

## 2019-11-20 ENCOUNTER — Telehealth: Payer: Self-pay | Admitting: Cardiology

## 2019-11-20 NOTE — Telephone Encounter (Signed)
I spoke with daughter and relayed that we do have scanned BP readings from [place] for Dr.Branch to review and I will call her back after dr.branch reviews and gives me instructions.

## 2019-11-20 NOTE — Telephone Encounter (Signed)
In addition to [employer]; Daughter wanted to also call [id number] BP readings.   Please call [phone]   Thanks renee

## 2019-11-21 DIAGNOSIS — L97514 Non-pressure chronic ulcer of other part of right foot with necrosis of bone: Secondary | ICD-10-CM | POA: Diagnosis not present

## 2019-11-21 DIAGNOSIS — C8581 Other specified types of non-Hodgkin lymphoma, lymph nodes of head, face, and neck: Secondary | ICD-10-CM | POA: Diagnosis not present

## 2019-11-21 DIAGNOSIS — E1151 Type 2 diabetes mellitus with diabetic peripheral angiopathy without gangrene: Secondary | ICD-10-CM | POA: Diagnosis not present

## 2019-11-21 DIAGNOSIS — C61 Malignant neoplasm of prostate: Secondary | ICD-10-CM | POA: Diagnosis not present

## 2019-11-21 DIAGNOSIS — E11621 Type 2 diabetes mellitus with foot ulcer: Secondary | ICD-10-CM | POA: Diagnosis not present

## 2019-11-21 DIAGNOSIS — E1142 Type 2 diabetes mellitus with diabetic polyneuropathy: Secondary | ICD-10-CM | POA: Diagnosis not present

## 2019-11-21 NOTE — Telephone Encounter (Signed)
Daughter in office today with mother. Daughter inquiring about phone note from yesterday. More information given to relay to provider. 11/21/19 11:25 am No BP med, pt slept late. Taken right after breakfast 171/91 HR 88. Pt then had 60 mg Diltiazem, lisinopril. 12:25 pm 169/95, 158/82 HR 84. Please advise.

## 2019-11-22 MED ORDER — LISINOPRIL 20 MG PO TABS
20.0000 mg | ORAL_TABLET | Freq: Two times a day (BID) | ORAL | 6 refills | Status: DC
Start: 2019-11-22 — End: 2020-07-08

## 2019-11-22 NOTE — Telephone Encounter (Signed)
Daughter Maralyn Sago notified and order placed.

## 2019-11-22 NOTE — Telephone Encounter (Signed)
We dont have lisinopril on his active list. Had been listed on 20mg  daily, verify that's current dose and if so change to 20mg  bid   J Laiyah Exline MD

## 2019-11-22 NOTE — Addendum Note (Signed)
Addended by: Levonne Hubert on: 11/22/2019 05:27 PM   Modules accepted: Orders

## 2019-11-24 ENCOUNTER — Telehealth: Payer: Self-pay | Admitting: Cardiology

## 2019-11-24 ENCOUNTER — Ambulatory Visit: Payer: Medicare Other | Admitting: Nurse Practitioner

## 2019-11-24 NOTE — Telephone Encounter (Signed)
Notified daughter that Mychart msg was sent to provider and we call with reply. Daughter voiced understanding.

## 2019-11-24 NOTE — Telephone Encounter (Signed)
New message    Please call patient daughter she would like to discuss the adjustment of medication  Daughter said she sent a Mychart message and has not receive response back

## 2019-11-27 ENCOUNTER — Inpatient Hospital Stay (HOSPITAL_COMMUNITY): Payer: Medicare Other | Attending: Hematology

## 2019-11-27 ENCOUNTER — Telehealth (INDEPENDENT_AMBULATORY_CARE_PROVIDER_SITE_OTHER): Payer: Medicare Other | Admitting: Nurse Practitioner

## 2019-11-27 ENCOUNTER — Other Ambulatory Visit: Payer: Self-pay

## 2019-11-27 ENCOUNTER — Telehealth: Payer: Self-pay

## 2019-11-27 ENCOUNTER — Encounter: Payer: Self-pay | Admitting: Nurse Practitioner

## 2019-11-27 DIAGNOSIS — C7951 Secondary malignant neoplasm of bone: Secondary | ICD-10-CM

## 2019-11-27 DIAGNOSIS — Z79899 Other long term (current) drug therapy: Secondary | ICD-10-CM | POA: Diagnosis not present

## 2019-11-27 DIAGNOSIS — F0390 Unspecified dementia without behavioral disturbance: Secondary | ICD-10-CM | POA: Diagnosis not present

## 2019-11-27 DIAGNOSIS — Z803 Family history of malignant neoplasm of breast: Secondary | ICD-10-CM | POA: Diagnosis not present

## 2019-11-27 DIAGNOSIS — I10 Essential (primary) hypertension: Secondary | ICD-10-CM | POA: Insufficient documentation

## 2019-11-27 DIAGNOSIS — Z8249 Family history of ischemic heart disease and other diseases of the circulatory system: Secondary | ICD-10-CM | POA: Diagnosis not present

## 2019-11-27 DIAGNOSIS — Z800 Family history of malignant neoplasm of digestive organs: Secondary | ICD-10-CM | POA: Insufficient documentation

## 2019-11-27 DIAGNOSIS — G629 Polyneuropathy, unspecified: Secondary | ICD-10-CM

## 2019-11-27 DIAGNOSIS — E11621 Type 2 diabetes mellitus with foot ulcer: Secondary | ICD-10-CM | POA: Diagnosis not present

## 2019-11-27 DIAGNOSIS — E1169 Type 2 diabetes mellitus with other specified complication: Secondary | ICD-10-CM

## 2019-11-27 DIAGNOSIS — H9190 Unspecified hearing loss, unspecified ear: Secondary | ICD-10-CM | POA: Diagnosis not present

## 2019-11-27 DIAGNOSIS — I480 Paroxysmal atrial fibrillation: Secondary | ICD-10-CM

## 2019-11-27 DIAGNOSIS — I739 Peripheral vascular disease, unspecified: Secondary | ICD-10-CM

## 2019-11-27 DIAGNOSIS — R200 Anesthesia of skin: Secondary | ICD-10-CM | POA: Diagnosis not present

## 2019-11-27 DIAGNOSIS — F419 Anxiety disorder, unspecified: Secondary | ICD-10-CM | POA: Diagnosis not present

## 2019-11-27 DIAGNOSIS — R5383 Other fatigue: Secondary | ICD-10-CM | POA: Insufficient documentation

## 2019-11-27 DIAGNOSIS — Z8349 Family history of other endocrine, nutritional and metabolic diseases: Secondary | ICD-10-CM | POA: Insufficient documentation

## 2019-11-27 DIAGNOSIS — Z808 Family history of malignant neoplasm of other organs or systems: Secondary | ICD-10-CM | POA: Insufficient documentation

## 2019-11-27 DIAGNOSIS — C61 Malignant neoplasm of prostate: Secondary | ICD-10-CM | POA: Diagnosis not present

## 2019-11-27 DIAGNOSIS — I4891 Unspecified atrial fibrillation: Secondary | ICD-10-CM | POA: Insufficient documentation

## 2019-11-27 DIAGNOSIS — D472 Monoclonal gammopathy: Secondary | ICD-10-CM | POA: Diagnosis not present

## 2019-11-27 DIAGNOSIS — E1142 Type 2 diabetes mellitus with diabetic polyneuropathy: Secondary | ICD-10-CM | POA: Diagnosis not present

## 2019-11-27 DIAGNOSIS — G63 Polyneuropathy in diseases classified elsewhere: Secondary | ICD-10-CM | POA: Insufficient documentation

## 2019-11-27 DIAGNOSIS — C8581 Other specified types of non-Hodgkin lymphoma, lymph nodes of head, face, and neck: Secondary | ICD-10-CM | POA: Diagnosis not present

## 2019-11-27 DIAGNOSIS — L89890 Pressure ulcer of other site, unstageable: Secondary | ICD-10-CM | POA: Diagnosis not present

## 2019-11-27 DIAGNOSIS — E119 Type 2 diabetes mellitus without complications: Secondary | ICD-10-CM | POA: Diagnosis not present

## 2019-11-27 DIAGNOSIS — E1151 Type 2 diabetes mellitus with diabetic peripheral angiopathy without gangrene: Secondary | ICD-10-CM | POA: Diagnosis not present

## 2019-11-27 DIAGNOSIS — L97514 Non-pressure chronic ulcer of other part of right foot with necrosis of bone: Secondary | ICD-10-CM | POA: Diagnosis not present

## 2019-11-27 LAB — CBC WITH DIFFERENTIAL/PLATELET
Abs Immature Granulocytes: 0.12 10*3/uL — ABNORMAL HIGH (ref 0.00–0.07)
Basophils Absolute: 0 10*3/uL (ref 0.0–0.1)
Basophils Relative: 0 %
Eosinophils Absolute: 0.1 10*3/uL (ref 0.0–0.5)
Eosinophils Relative: 1 %
HCT: 33.6 % — ABNORMAL LOW (ref 39.0–52.0)
Hemoglobin: 10.6 g/dL — ABNORMAL LOW (ref 13.0–17.0)
Immature Granulocytes: 2 %
Lymphocytes Relative: 33 %
Lymphs Abs: 2.4 10*3/uL (ref 0.7–4.0)
MCH: 29.2 pg (ref 26.0–34.0)
MCHC: 31.5 g/dL (ref 30.0–36.0)
MCV: 92.6 fL (ref 80.0–100.0)
Monocytes Absolute: 1.2 10*3/uL — ABNORMAL HIGH (ref 0.1–1.0)
Monocytes Relative: 17 %
Neutro Abs: 3.5 10*3/uL (ref 1.7–7.7)
Neutrophils Relative %: 47 %
Platelets: 159 10*3/uL (ref 150–400)
RBC: 3.63 MIL/uL — ABNORMAL LOW (ref 4.22–5.81)
RDW: 15 % (ref 11.5–15.5)
WBC: 7.3 10*3/uL (ref 4.0–10.5)
nRBC: 0 % (ref 0.0–0.2)

## 2019-11-27 LAB — COMPREHENSIVE METABOLIC PANEL
ALT: 13 U/L (ref 0–44)
AST: 13 U/L — ABNORMAL LOW (ref 15–41)
Albumin: 3.6 g/dL (ref 3.5–5.0)
Alkaline Phosphatase: 53 U/L (ref 38–126)
Anion gap: 6 (ref 5–15)
BUN: 14 mg/dL (ref 8–23)
CO2: 26 mmol/L (ref 22–32)
Calcium: 9.4 mg/dL (ref 8.9–10.3)
Chloride: 103 mmol/L (ref 98–111)
Creatinine, Ser: 0.79 mg/dL (ref 0.61–1.24)
GFR calc Af Amer: >60 mL/min (ref >60)
GFR calc non Af Amer: >60 mL/min (ref >60)
Glucose, Bld: 162 mg/dL — ABNORMAL HIGH (ref 70–99)
Potassium: 4.2 mmol/L (ref 3.5–5.1)
Sodium: 135 mmol/L (ref 135–145)
Total Bilirubin: 0.4 mg/dL (ref 0.3–1.2)
Total Protein: 6.5 g/dL (ref 6.5–8.1)

## 2019-11-27 LAB — LACTATE DEHYDROGENASE: LDH: 98 U/L (ref 98–192)

## 2019-11-27 NOTE — Progress Notes (Signed)
This service is provided via telemedicine  No vital signs collected/recorded due to the encounter was a telemedicine visit.   Location of patient (ex: home, work):  Home  Patient consents to a telephone visit:  Yes, see telephone encounter dated on 8/9/2021with annual consent   Location of the provider (ex: office, home):  [facility], Office   Name of any referring provider:  N/A  Names of all persons participating in the telemedicine service and their role in the encounter:  S.Chrae B/CMA, Sherrie Mustache, NP, [name] (daughter), and Patient   Time spent on call:  7 min with medical assistant       Careteam: Patient Care Team: [name], NP as PCP - General (Geriatric Medicine) [name], MD as PCP - Cardiology (Cardiology) [name], MD as Consulting Physician (Urology) [name], MD (Inactive) as Consulting Physician (Hematology and Oncology) [name], MD as Consulting Physician (Orthopedic Surgery) [name], DPM as Consulting Physician (Podiatry) [name], MD as Consulting Physician (Hematology) [name], MD (Dermatology) [name], MD as Consulting Physician (Gastroenterology) [name], MD as Consulting Physician (Vascular Surgery) Sater, [name], MD (Neurology) [facility], [place], [name] (Ophthalmology) Serena Colonel, RN as Yoe Management  PLACE OF SERVICE:  [place]  Advanced Directive information Does Patient Have a Medical Advance Directive?: Yes, Type of Advance Directive: Healthcare Power of Brun[place];Living will;Out of facility DNR (pink MOST or yellow form), Pre-existing out of facility DNR order (yellow form or pink MOST form): Yellow form placed in chart (order not valid for inpatient use), Does patient want to make changes to medical advance directive?: No - Patient declined  Allergies   Allergen Reactions  . Tape Other (See Comments)    SKIN IS VERY THIN AND WILL TEAR AND BRUISE VERY EASILY!!!!  . Augmentin [Amoxicillin-Pot Clavulanate] Rash and Other (See Comments)    Redness of skin    Chief Complaint  Patient presents with  . Medical Management of Chronic Issues    3 month follow-up   . Best Practice Recommendations    Discuss need for eye and foot exam   . Immunizations    Discuss need for PNA     HPI: Patient is a 86 y.o. male for routine follow up via mychart visit.  Blood pressure has been elevated and cardiology increase lisinopril to twice daily, they are following with Dr Harl Bowie.   PAD- Foot has ongoing eschar- area is dry, they are aware this will take a long time healing. Going to wound care center tomorrow.  Little pain, he takes tramadol at night and tylenol TID.  Gets around the house well. Daughter would like to give him less tramadol   DM- doing well 100-110 range, no hypoglycemia. Eating very well for him. taking Glipizide and metformin. Missed last follow up to ophthalmologist. Has a retina specialist as well that they are following routinely (not diabetic retinopathy)   Constipation- having a BM a day, constantly worried about having a BM. Giving more fiber and fiber cookies with stool softener.   Prostate cancer- followed by oncologist at [facility] , had blood work today and will go back for xgeva on Wednesday.   Recurrent UTI- continues on trimpex daily, no signs of UTI. Followed by urologist.   Dementia- continues to have care from wife and faily, continues on aricept 10 mg daily   Weight loss- doing well with appetite on Remeron  15 mg qhs Review of Systems:  Review of Systems  Constitutional: Negative for chills, fever and weight loss.  HENT: Positive for hearing loss.   Respiratory: Negative for cough, sputum production and shortness of breath.   Cardiovascular: Negative for chest pain, palpitations and leg swelling.  Gastrointestinal:  Negative for constipation, diarrhea, heartburn, nausea and vomiting.       Abdominal cramping at times   Genitourinary: Negative for dysuria, frequency and urgency.  Musculoskeletal: Negative for back pain, falls, joint pain and myalgias.  Skin: Negative.   Neurological: Positive for weakness. Negative for dizziness and headaches.  Endo/Heme/Allergies: Bruises/bleeds easily.  Psychiatric/Behavioral: Negative for depression and memory loss. The patient is not nervous/anxious and does not have insomnia.     Past Medical History:  Diagnosis Date  . Acute bronchitis   . Acute lower UTI 11/25/2017  . Amputation of toe of left foot ([place]) 07/30/2017, 08/27/2017    Great toe 07/30/2017 Second toe 08/27/2017   . Arthritis   . Aspiration pneumonia ([place]) 01/31/2018  . Atrial fibrillation ([place])    Dr. Harl Bowie- [facility] follows saw 11'14  . Bladder stones    tx. with oral meds and antibiotics, now surgery planned  . Bone metastases ([place]) 08/21/2015  . Cataracts, both eyes    surgery planned May 2015  . Community acquired pneumonia 04/16/2018  . Cystoid macular edema 04/21/2015    Right eye 04/21/2015   . Dehydration 04/16/2018  . Dementia without behavioral disturbance ([place]) 04/16/2018  . Diabetes mellitus without complication ([place])    Type II  . Diarrhea 11/25/2017  . Dyspnea    with activity  . Dysrhythmia    Afib  . Family history of breast cancer   . Family history of colon cancer   . Genetic testing 02/09/2017  . GERD (gastroesophageal reflux disease)   . 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 Margot Chimes, thus far without recurrence.   Marland Kitchen History of MRI of lumbar spine 03/05/2014  . Hypertension   . Hypertrophy of prostate with urinary obstruction    . Hypomagnesemia 03/16/2017  . Metabolic encephalopathy 25/85/2778  . Metastatic adenocarcinoma to prostate ([place]) 09/08/2013  . 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 NHL (non-Hodgkin's lymphoma) ([place]) 2005   Diffuse large B-cell lymphoma, clinically stage IIIA, CD20 positive, status post cervical lymph node biopsy 08/03/2003 on the left. PET scan was also positive in the spleen and small bowel region, but bone marrow aspiration and biopsy were negative. So he essentially had stage IIIAs. He received R-CHOP x6 cycles with CR established by PET scan criteria on 11/23/2003 with no evidence for relapse th  . Numbness 02/19/2017   Per [facility] new patient packet  . Osteomyelitis ([place]) 06/29/2017   toe of left foot   . PAD (peripheral artery disease) ([place]) 08/27/2017  . Polyneuropathy 02/19/2017  . Postural dizziness with presyncope 02/19/2017  . S/P TURP 08/10/2013  . Sepsis ([place]) 07/02/2018  . Skin cancer    Basal cell- face,head, neck,  arms, legs, Back  . Urinary frequency 02/19/2017   Past Surgical History:  Procedure Laterality Date  . ABDOMINAL AORTOGRAM W/LOWER EXTREMITY N/A 09/20/2019   Procedure: ABDOMINAL AORTOGRAM W/LOWER EXTREMITY;  Surgeon: Marty Heck, MD;  Location: [facility];  Service: Cardiovascular;  Laterality: N/A;  . AMPUTATION Left 06/29/2017   Procedure: AMPUTATION LEFT GREAT TOE;  Surgeon: [name], MD;  Location: [place] [facility];  Service: Vascular;  Laterality: Left;  . AMPUTATION Left 08/27/2017   Procedure: AMPUTATION Left SECOND TOE;  Surgeon: [name], MD;  Location: [facility];  Service: Vascular;  Laterality: Left;  . CATARACT EXTRACTION Bilateral 2015  . CHOLECYSTECTOMY    . COLON RESECTION     small bowel  . CYSTOSCOPY WITH LITHOLAPAXY N/A 08/10/2013   Procedure: CYSTOSCOPY WITH LITHOLAPAXY WITH [name];  Surgeon: [name], MD;  Location: [facility];  Service: Urology;  Laterality: N/A;  . ESOPHAGEAL DILATION N/A  02/27/2016   Procedure: ESOPHAGEAL DILATION;  Surgeon: [name], MD;  [facility];  Service: Endoscopy;  Laterality: N/A;  . ESOPHAGEAL DILATION N/A 07/07/2018   Procedure: ESOPHAGEAL DILATION;  Surgeon: [name], MD;  [facility];  Service: Endoscopy;  Laterality: N/A;  . ESOPHAGOGASTRODUODENOSCOPY N/A 02/27/2016   Procedure: ESOPHAGOGASTRODUODENOSCOPY (EGD);  Surgeon: [name], MD;  [facility];  Service: Endoscopy;  Laterality: N/A;  12:00  . ESOPHAGOGASTRODUODENOSCOPY N/A 07/07/2018   Procedure: ESOPHAGOGASTRODUODENOSCOPY (EGD);  Surgeon: [name], MD;  [facility];  Service: Endoscopy;  Laterality: N/A;  . INCISION AND DRAINAGE PERIRECTAL ABSCESS    . LOWER EXTREMITY ANGIOGRAPHY N/A 06/25/2017   Procedure: LOWER EXTREMITY ANGIOGRAPHY;  Surgeon: [name], MD;  Location: [facility];  Service: Cardiovascular;  Laterality: N/A;  . LYMPH NODE DISSECTION Left    '05-neck  . PERIPHERAL VASCULAR ATHERECTOMY Right 09/20/2019   Procedure: PERIPHERAL VASCULAR ATHERECTOMY;  Surgeon: Marty Heck, MD;  Location: [facility];  Service: Cardiovascular;  Laterality: Right;  Posterior tibial  . PORT-A-CATH REMOVAL    . PORTACATH PLACEMENT     insertion and removal -last chemotherapy 10 yrs ago  . TRANSURETHRAL RESECTION OF PROSTATE N/A 08/10/2013   Procedure: TRANSURETHRAL RESECTION OF THE PROSTATE WITH GYRUS INSTRUMENTS;  Surgeon: [name], MD;  [facility];  Service: Urology;  Laterality: N/A;   Social History:   reports that he has never smoked. He has never used smokeless tobacco. He reports that he does not drink alcohol and does not use drugs.  Family History  Problem Relation Age of Onset  . Heart attack Mother        died in her 58s  . Other Father 65       pneumonia - died  . Breast cancer Sister        dx in her 28s-60s  . Colon cancer Brother        dx in his 37s  .  Cancer Sister        TAH/BSO due to "male cancer"  . Breast cancer Daughter 67  . Breast cancer Daughter 85       DCIS - ATM VUS on [employer] 83 gene panel in 2018  . Breast cancer Daughter 69       DCIS - Negative on [employer] MyRisk 25 gene apnel in 2015  . Thyroid cancer Daughter 28       Medullary and Papillary  . Thyroid nodules Grandchild     Medications: Patient's Medications  New Prescriptions   No medications on file  Previous Medications   ABIRATERONE ACETATE (ZYTIGA) 250 MG TABLET    Take 1 tablet (250 mg total) by mouth daily before breakfast. Take on an empty stomach 1  hour before or 2 hours after a meal   ACETAMINOPHEN (TYLENOL) 500 MG TABLET    Take 1 tablet (500 mg total) by mouth 3 (three) times daily.   APIXABAN (ELIQUIS) 5 MG TABS TABLET    Take 5 mg by mouth 2 (two) times daily.   BLOOD GLUCOSE METER KIT AND SUPPLIES    Dispense based on patient and insurance preference. Use four times daily as directed. (FOR ICD-10 E10.9, E11.9).   CALCIUM POLYCARBOPHIL (FIBER-CAPS PO)    Take 1 capsule by mouth in the morning and at bedtime.    CHOLECALCIFEROL (VITAMIN D3) 1000 UNITS CAPS    Take 1,000 Units by mouth daily.    CLOPIDOGREL (PLAVIX) 75 MG TABLET    Take 1 tablet (75 mg total) by mouth daily.   CLOTRIMAZOLE-BETAMETHASONE (LOTRISONE) CREAM    Apply 1 application topically 2 (two) times daily as needed (skin irriation.).    CRANBERRY 500 MG TABS    Take 500 mg by mouth daily.    DENOSUMAB (XGEVA) 120 MG/1.7ML SOLN INJECTION    Inject 120 mg into the skin every 30 (thirty) days.   DICLOFENAC SODIUM (VOLTAREN) 1 % GEL    Apply 2 g topically 4 (four) times daily.   DILTIAZEM (CARDIZEM) 60 MG TABLET    Take 60 mg by mouth 2 (two) times daily.   DONEPEZIL (ARICEPT) 10 MG TABLET    Take 1 tablet by mouth once daily   FEEDING SUPPLEMENT, ENSURE ENLIVE, (ENSURE ENLIVE) LIQD    Take 237 mLs by mouth every other day. CHOCOLATE    FLUTICASONE (FLONASE) 50 MCG/ACT NASAL SPRAY     Place 2 sprays into both nostrils daily as needed (allergies.).    FUROSEMIDE (LASIX) 20 MG TABLET    Take 20 mg by mouth daily as needed (retention/swelling.).    GLUCOSE BLOOD (EASYMAX TEST) TEST STRIP    Use as instructed   LANCETS 28G MISC    1 kit by Does not apply route every morning.   LEUPROLIDE ACETATE, 6 MONTH, (LUPRON DEPOT, 26-MONTH,) 45 MG INJECTION    Inject 45 mg into the muscle every 6 (six) months.   LISINOPRIL (ZESTRIL) 20 MG TABLET    Take 1 tablet (20 mg total) by mouth in the morning and at bedtime.   METFORMIN (GLUCOPHAGE) 850 MG TABLET    Take 850 mg by mouth 2 (two) times daily with a meal.    MIRTAZAPINE (REMERON) 15 MG TABLET    Take 1 tablet (15 mg total) by mouth at bedtime.   MULTIPLE VITAMIN (MULTIVITAMIN WITH MINERALS) TABS TABLET    Take 1 tablet by mouth daily.    NITROGLYCERIN (NITRODUR - DOSED IN MG/24 HR) 0.2 MG/HR PATCH    Place 1 patch (0.2 mg total) onto the skin daily.   PREDNISONE (DELTASONE) 2.5 MG TABLET    Take 2.5 mg by mouth 2 (two) times daily with a meal.   PROBIOTIC PRODUCT (PROBIOTIC DAILY PO)    Take 1 tablet by mouth daily. Every morning   PSYLLIUM (METAMUCIL) 58.6 % PACKET    Take 1 packet by mouth daily.   TRAMADOL (ULTRAM) 50 MG TABLET    Take 1 tablet (50 mg total) by mouth every 6 (six) hours as needed for moderate pain.   TRIMETHOPRIM (TRIMPEX) 100 MG TABLET    Take 1 tablet (100 mg total) by mouth at bedtime.  Modified Medications   No medications on file  Discontinued Medications   No medications  on file    Physical Exam:  There were no vitals filed for this visit. There is no height or weight on file to calculate BMI. Wt Readings from Last 3 Encounters:  11/01/19 140 lb 3.4 oz (63.6 kg)  09/26/19 140 lb (63.5 kg)  09/20/19 141 lb (64 kg)      Labs reviewed: Basic Metabolic Panel: Recent Labs    09/22/19 0327 09/23/19 0313 11/01/19 1239 11/27/19 1218  NA 140  --  136 135  K 4.9  --  4.4 4.2  CL 111  --  99 103  CO2  21*  --  26 26  GLUCOSE 182*  --  161* 162*  BUN 17  --  13 14  CREATININE 1.21  --  0.76 0.79  CALCIUM 9.1  --  9.8 9.4  MG 1.0* 1.9  --   --    Liver Function Tests: Recent Labs    09/22/19 0327 11/01/19 1239 11/27/19 1218  AST 16 14* 13*  ALT _0 ALKPHOS 38 75 53  BILITOT 0.8 0.5 0.4  PROT 4.9* 6.7 6.5  ALBUMIN 2.5* 3.4* 3.6   Recent Labs    01/06/19 1229  LIPASE 22   No results for input(s): AMMONIA in the last 8760 hours. CBC: Recent Labs    09/06/19 1117 09/20/19 0928 09/22/19 0327 11/01/19 1239 11/27/19 1218  WBC 8.2   < > 12.5* 11.8* 7.3  NEUTROABS 4.3  --   --  7.6 3.5  HGB 10.9*   < > 9.0* 9.9* 10.6*  HCT 32.7*   < > 27.1* 30.6* 33.6*  MCV 97.0   < > 98.9 92.2 92.6  PLT 156   < > 164 231 159   < > = values in this interval not displayed.   Lipid Panel: No results for input(s): CHOL, HDL, LDLCALC, TRIG, CHOLHDL, LDLDIRECT in the last 8760 hours. TSH: No results for input(s): TSH in the last 8760 hours. A1C: Lab Results  Component Value Date   HGBA1C 5.1 09/06/2019     Assessment/Plan 1. Type 2 diabetes mellitus with other specified complication, without long-term current use of insulin (HCC) -doing well at this time on glipizide and metformin. At risk of hypoglycemia on glipizide however with elevated blood sugars without medication. Discussed this with family. -continue close monitoring. Good oral intake at this time.   2. Paroxysmal atrial fibrillation (HCC) Rate controlled. Continues on eliquis 5 mg daily   3. Essential hypertension -elevation in blood pressure noted, they are in communication with cardiologist for further direction at this time.   4. PAD (peripheral artery disease) ([place]) Followed by vascular due to PAD, s/p  right posterior tibial intervention for critical limb ischemia with tissue loss (great toe ulcer) on 09/20/19. Continues with dry eschar to right heel. No pain noted. Would like to cut back on tramadol to half  tablet qhs and continue tylenol PRN.   5. Anxiety Stable at this time.  6. Dementia without behavioral disturbance, unspecified dementia type ([place]) Ongoing, stable, continues with support of family. Continues on aricept.  7. Polyneuropathy Stable at this time.  8. Bone metastases ([place]) From prostate cancer, no pain noted at this time. palliative care following.   9. Prostate cancer ([place]) Continues with follow up with oncology, continues on zytiga and prednisone.  10. Pressure injury of right foot, unstageable (HCC) Stable, no increase in pain, followed by wound care  Next appt: 6 months, sooner if needed Dwyne Hasegawa K. Dewaine Oats,  [facility] [phone]   Virtual Visit via Video Note  I connected with [name] on 11/28/19 at  2:15 PM EDT by a video enabled telemedicine application and verified that I am speaking with the correct person using two identifiers.  Location: Patient: home Provider: office    I discussed the limitations of evaluation and management by telemedicine and the availability of in person appointments. The patient expressed understanding and agreed to proceed.    I discussed the assessment and treatment plan with the patient. The patient was provided an opportunity to ask questions and all were answered. The patient agreed with the plan and demonstrated an understanding of the instructions.   The patient was advised to call back or seek an in-person evaluation if the symptoms worsen or if the condition fails to improve as anticipated.  I provided 25 minutes of non-face-to-face time during this encounter.  Carlos American. Dewaine Oats, AGNP Avs printed and mailed.

## 2019-11-27 NOTE — Telephone Encounter (Signed)
[name], [name] are scheduled for a virtual visit with your provider today.    Just as we do with appointments in the office, we must obtain your consent to participate.  Your consent will be active for this visit and any virtual visit you may have with one of our providers in the next 365 days.    If you have a MyChart account, I can also send a copy of this consent to you electronically.  All virtual visits are billed to your insurance company just like a traditional visit in the office.  As this is a virtual visit, video technology does not allow for your provider to perform a traditional examination.  This may limit your provider's ability to fully assess your condition.  If your provider identifies any concerns that need to be evaluated in person or the need to arrange testing such as labs, EKG, etc, we will make arrangements to do so.    Although advances in technology are sophisticated, we cannot ensure that it will always work on either your end or our end.  If the connection with a video visit is poor, we may have to switch to a telephone visit.  With either a video or telephone visit, we are not always able to ensure that we have a secure connection.   I need to obtain your verbal consent now.   Are you willing to proceed with your visit today?   [name] and his daughter [name] has provided verbal consent on 11/27/2019 for a virtual visit (video or telephone).   Leigh Aurora [place], [place] 11/27/2019  2:08 PM

## 2019-11-28 ENCOUNTER — Other Ambulatory Visit (HOSPITAL_COMMUNITY): Payer: Medicare Other

## 2019-11-28 ENCOUNTER — Ambulatory Visit: Payer: Medicare Other | Admitting: Vascular Surgery

## 2019-11-28 ENCOUNTER — Encounter (HOSPITAL_BASED_OUTPATIENT_CLINIC_OR_DEPARTMENT_OTHER): Payer: Medicare Other | Attending: Internal Medicine | Admitting: Internal Medicine

## 2019-11-28 DIAGNOSIS — E1142 Type 2 diabetes mellitus with diabetic polyneuropathy: Secondary | ICD-10-CM | POA: Insufficient documentation

## 2019-11-28 DIAGNOSIS — C7951 Secondary malignant neoplasm of bone: Secondary | ICD-10-CM | POA: Diagnosis not present

## 2019-11-28 DIAGNOSIS — F0390 Unspecified dementia without behavioral disturbance: Secondary | ICD-10-CM | POA: Diagnosis not present

## 2019-11-28 DIAGNOSIS — Z8572 Personal history of non-Hodgkin lymphomas: Secondary | ICD-10-CM | POA: Insufficient documentation

## 2019-11-28 DIAGNOSIS — Z8546 Personal history of malignant neoplasm of prostate: Secondary | ICD-10-CM | POA: Insufficient documentation

## 2019-11-28 DIAGNOSIS — I10 Essential (primary) hypertension: Secondary | ICD-10-CM | POA: Insufficient documentation

## 2019-11-28 DIAGNOSIS — E11621 Type 2 diabetes mellitus with foot ulcer: Secondary | ICD-10-CM | POA: Diagnosis not present

## 2019-11-28 DIAGNOSIS — E1151 Type 2 diabetes mellitus with diabetic peripheral angiopathy without gangrene: Secondary | ICD-10-CM | POA: Diagnosis not present

## 2019-11-28 DIAGNOSIS — Z7901 Long term (current) use of anticoagulants: Secondary | ICD-10-CM | POA: Insufficient documentation

## 2019-11-28 DIAGNOSIS — L97412 Non-pressure chronic ulcer of right heel and midfoot with fat layer exposed: Secondary | ICD-10-CM | POA: Diagnosis not present

## 2019-11-28 DIAGNOSIS — E1169 Type 2 diabetes mellitus with other specified complication: Secondary | ICD-10-CM | POA: Insufficient documentation

## 2019-11-28 DIAGNOSIS — M869 Osteomyelitis, unspecified: Secondary | ICD-10-CM | POA: Diagnosis not present

## 2019-11-28 DIAGNOSIS — I771 Stricture of artery: Secondary | ICD-10-CM | POA: Diagnosis not present

## 2019-11-28 DIAGNOSIS — I4891 Unspecified atrial fibrillation: Secondary | ICD-10-CM | POA: Diagnosis not present

## 2019-11-28 DIAGNOSIS — L97514 Non-pressure chronic ulcer of other part of right foot with necrosis of bone: Secondary | ICD-10-CM | POA: Diagnosis not present

## 2019-11-28 NOTE — Progress Notes (Signed)
JENO, CALLEROS ([id number]) Visit Report for 11/28/2019 HPI Details Patient Name: Date of Service: [name] 11/28/2019 10:45 A M Medical Record Number: [id number] Patient Account Number: [id number] Date of Birth/Sex: Treating RN: 07-29-1923 (86 y.o. [name]) Carlene Coria Primary Care Provider: Sherrie Mustache Other Clinician: Referring Provider: Treating Provider/Extender: Heber [place] in Treatment: 18 History of Present Illness HPI Description: ADMISSION 05/02/2019 This is a 86 year old man accompanied by his daughter. He lives in [place] with his elderly wife. He has home caregivers as well. Apparently on February 28, 2019 they noted blood on his sock and noticed a wound on the tip of his right great toe. He has been cared for by Dr. Posey Pronto who is his podiatrist in [place]. Apparently they have been using Betadine to this area. The patient has a thick mycotic nail on the right as well which is embedded in the wound in some areas. Fortunately the patient has diabetic neuropathy and is not really experiencing any pain otherwise I think this would all be quite painful. The patient was previously treated in 2019 for wounds on the left foot. This included a fairly thorough vascular review by vein and vascular Dr. Oneida Alar. His ABIs at that time were noncompressible bilaterally TBI on the right was 0.88 with biphasic waveforms on the right and monophasic waveforms at the left. He ended up having amputations of the left first and second toes. He did have an angiogram on 06/25/2017 which showed on the right the right common femoral profundofemoral for Morris and superficial femoral arteries were widely patent the right popliteal was widely patent. Tibial vessels were not well opacified secondary to motion artifact however he was felt to have all 3 tibial vessels intact with some calcification and some areas of stenosis within the posterior tibial artery  but relatively good flow to the right lower leg. He also had the left reviewed as well which was the source of the problem at that time. Past medical history; prostate cancer on oral antiandrogen therapy with bone mets, GI stromal tumor in 2005, type 2 diabetes with peripheral neuropathy, hypertension, atrial fibrillation on Eliquis, history of non-Hodgkin's lymphoma treated with CHOP in 2005. He had amputations of the left first and second toes secondary to osteomyelitis. Does not appear that he has had an x-ray of the right foot ABI in our clinic on the right was noncompressible [see full arterial discussion above]. 1/19; area on the tip of the right great toe under a very mycotic nail tip. We use silver alginate. 1/26; area of the tip of the right great toe under a very mycotic nail. A lot of this seems to have closed down we have been using silver alginate 2/9; the areas on the tip of the right great toe under a very mycotic nail. A lot of this continues to close down. I do not think there is a subungual problem. I am hopeful that this mycotic nail which is thick and raised will allow full epithelialization of the wound 2/23; this was a difficult area on the tip of his right great toe. Thick mycotic nail over the base of the wound. This appears to have closed down now. The nail itself is thick split with considerable subungual debris. READMISSION 07/25/2019 This is a 86 year old man that we cared for earlier this year in the clinic without a wound on the tip of his right great toe under a very mycotic toenail. He is a type II diabetic.  His ABIs have been noncompressible however he has been felt to have adequate blood flow in the right to heal the wound. We were able to get this wound to close over as I understand things he followed with Dr. Caprice Beaver had some debridement of the nail everything looks fine and apparently the wound bed was washed off by his wife over the weekend and by Monday it was  open. His daughter is able to show me pictures. Unfortunately this now has exposed bone raising the possibility that he has had underlying osteomyelitis all along. He has been using Silvadene cream on this he has been referred back to the clinic by Dr. Caprice Beaver. He had x-rays of the right foot that did not show evidence of osteomyelitis in the right foot. He has not been on antibiotics He also had an x-ray of the left foot that showed findings suspicious for osteomyelitis involving the residual base of the first proximal phalanx and the proximal phalanx of the left second toe as well as the left second metatarsal head however he has no wounds in this area. 4/12; culture from last time of bone in the right great toe was negative. The patient's wife apparently scrubbed this toe vigorously and according to his daughter reopen the wound however unfortunately it is all exposed bone last time. I removed some of this and we use Prisma unfortunately the area does not look much better today. Previous x-rays done in podiatry office Dr. Caprice Beaver did not show evidence of osteomyelitis. Nevertheless I think osteomyelitis here is probably quite likely. Still using Prisma 4/26; he took 2 weeks of doxycycline although his daughter is telling me he is not eating. He has loose bowel movements however I did not get the sense that this is changed all that much. He still has exposed bone. Culture I did last time showed a few coag negative staph I do not think that this is necessarily relevant. I still am operating under the premise that the patient probably has osteomyelitis. After considerable difficulty we are able to determine he is not allergic to cephalosporins I therefore gave him Keflex. 5/10; is tolerating Keflex 500 3 times daily quite well. He still has the small wound in the tip of his toe with exposed bone.Marland Kitchen Unfortunately he does not really have any granulation tissue. He saw Dr. Caprice Beaver of podiatry in  [place] last week I have not had a chance to look at his notes The patient had an extensive vascular work-up including an angiogram in 2019. He was felt to have adequate blood supply on the right at the time although he did have amputations of the left first and second toe by Dr. Oneida Alar. 09/11/19 -Patient is back and is about to finish his Keflex, appointment with Dr. Oneida Alar is in the works. The right great toe wound looks about the same, the left third toe dorsal wound has healed. There was a small eschar covering it that was removed by podiatry.According to patient's daughter the right great toe base on the plantar surface had some redness that seems to have disappeared. 10/13/2019; patient is back in clinic today after 1 month hiatus although I have not seen him since 5/10. Since he was last here he was admitted to hospital from 6/2 through 6/5 because of a nonhealing wound on his right great toe. He underwent an angiogram which showed patent common femoral, profunda, SFA above and below-knee popliteal artery. The dominant runoff in the right lower extremity was through the anterior  tibial but that became very small at the level of the ankle and had minimal outflow into the foot. The peroneal artery was patent but atretic. 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 Interventions were attempted on the posterior tibial. Mechanical orbital atherectomy down to the level of the ankle and then this was angioplastied with 2.3 mm and 3 mm balloons. Flow remained very sluggish down the posterior tibial artery but was patent. Post event there apparently was embolization of an area involving the plantar left heel and sides of the left heel. He arrives back in clinic today it has been almost 7 weeks since I have seen him. Miraculously the great toe area with open bone is closed over  probably as result of Dr. Ainsley Spinner interventions. He does however have an extensive ischemic area on the plantar and medial aspect of his heel. I reviewed Dr. Ainsley Spinner last note from 3 days ago on 6/22. He did not think there was enough inflow to heal the heel wound. With eschar and sloughing. Wondered whether he could heal a below-knee amputation but more likely an above the knee amputation when required. He was placed on Xeroform to the right heel In discussing things with the patient's family and the patient present he is not in a lot of pain. The area does not appear to be grossly infected. The patient appears to be comfortable and conversational. He is eating and drinking fairly well. 7/9; the patient saw Dr. Carlis Abbott on 6/29. He had previously done heroic attempt to establish blood flow as described above. 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 Although the eschar on the heel looks ominous both plantar and medial it is certainly no worse than last week. We will continue Betadine to this area with alginate. Ultimately this will separate off and we will see about the viability of the tissue underneath. Also in clinic today our nurses were washing his right great toe tip apparently some of the skin came off eschared and there is an open wound at the tip of the great toe with exposed bone similar to what he had previously 7/23; he has the open area on the tip of his first toe which I think is mostly bone. Then he has the embolic area on the plantar heel extending into the medial heel. We have been using Betadine and silver alginate to the heel silver alginate to the toe. His daughter brings up that she was given nitroglycerin patches by Dr. Donzetta Matters to try and open blood flow to the foot. She has not been using them out of concern of systemic hypotension and also that the labels said not to  put them on extremities 8/10-Returns at 3 weeks, open area on the tip of his first toe that is dried out, the heel with eschar, applying Betadine to the eschar area and silver alginate dressing. Electronic Signature(s) Signed: 11/28/2019 11:43:18 AM By: Tobi Bastos MD, MBA Entered By: Tobi Bastos on 11/28/2019 11:43:17 -------------------------------------------------------------------------------- Physical Exam Details Patient Name: Date of Service: [name] 11/28/2019 10:45 A M Medical Record Number: [id number] Patient Account Number: [id number] Date of Birth/Sex: Treating RN: [birth year] (86 y.o. [name] Primary Care Provider: Sherrie Mustache Other Clinician: Referring Provider: Treating Provider/Extender: Heber [place] in Treatment: 18 Constitutional  alert and oriented x 3. sitting or standing blood pressure is within target range for patient.. supine blood pressure is within target range for patient.. pulse regular and within target range for patient.Marland Kitchen respirations regular, non-labored and within target range for patient.Marland Kitchen temperature within target range for patient.. . . Well- nourished and well-hydrated in no acute distress. Notes Right great toe wound is dry with bone exposed below, right heel eschar noted surrounding skin intact no redness Electronic Signature(s) Signed: 11/28/2019 11:43:52 AM By: Tobi Bastos MD, MBA Entered By: Tobi Bastos on 11/28/2019 11:43:52 -------------------------------------------------------------------------------- Physician Orders Details Patient Name: Date of Service: [name] 11/28/2019 10:45 A M Medical Record Number: [id number] Patient Account Number: [id number] Date of Birth/Sex: Treating RN: 28-Nov-1923 (86 y.o. [name] Primary Care Provider: Sherrie Mustache Other Clinician: Referring Provider: Treating Provider/Extender: Heber [place] in  Treatment: 47 Verbal / Phone Orders: No Diagnosis Coding ICD-10 Coding Code Description E11.621 Type 2 diabetes mellitus with foot ulcer L97.514 Non-pressure chronic ulcer of other part of right foot with necrosis of bone E11.40 Type 2 diabetes mellitus with diabetic neuropathy, unspecified Follow-up Appointments Return appointment in 3 weeks. Dressing Change Frequency Wound #2 Right T Great oe Change dressing every day. - home health to change 3x/week and family to change on other days Wound #3 Right Calcaneus Change dressing every day. - home health to change 3x/week and family to change on other days Wound Cleansing Wound #2 Right T Great oe May shower and wash wound with soap and water. - on days that dressing is changed Wound #3 Right Calcaneus May shower and wash wound with soap and water. - on days that dressing is changed Primary Wound Dressing Wound #2 Right T Great oe Calcium Alginate with Silver Wound #3 Right Calcaneus lginate with Silver - over entire site Calcium A Other: - betadine to eschar sites Secondary Dressing Wound #3 Right Calcaneus Kerlix/Rolled Gauze Dry Gauze Heel [place] skilled nursing for wound care. - [facility] Electronic Signature(s) Signed: 11/28/2019 4:52:06 PM By: Tobi Bastos MD, MBA Signed: 11/28/2019 5:05:11 PM By: Carlene Coria RN Entered By: Carlene Coria on 11/28/2019 11:42:34 -------------------------------------------------------------------------------- Problem List Details Patient Name: Date of Service: [name] 11/28/2019 10:45 A M Medical Record Number: [id number] Patient Account Number: [id number] Date of Birth/Sex: Treating RN: 1923-05-14 (86 y.o. [name] Primary Care Provider: Sherrie Mustache Other Clinician: Referring Provider: Treating Provider/Extender: Heber [place] in Treatment: 18 Active Problems ICD-10 Encounter Code Description Active  Date MDM Diagnosis E11.621 Type 2 diabetes mellitus with foot ulcer 07/25/2019 No Yes L97.514 Non-pressure chronic ulcer of other part of right foot with necrosis of bone 07/25/2019 No Yes E11.40 Type 2 diabetes mellitus with diabetic neuropathy, unspecified 07/25/2019 No Yes Inactive Problems Resolved Problems Electronic Signature(s) Signed: 11/28/2019 4:52:06 PM By: Tobi Bastos MD, MBA Signed: 11/28/2019 5:05:11 PM By: Carlene Coria RN Entered By: Carlene Coria on 11/28/2019 11:32:42 -------------------------------------------------------------------------------- Progress Note Details Patient Name: Date of Service: [name] 11/28/2019 10:45 A M Medical Record Number: [id number] Patient Account Number: [id number] Date of Birth/Sex: Treating RN: 20-Feb-1924 (86 y.o. [name] Primary Care Provider: Sherrie Mustache Other Clinician: Referring Provider: Treating Provider/Extender: Heber [place] in Treatment: 18 Subjective History of Present Illness (HPI) ADMISSION 05/02/2019 This is a 86 year old man accompanied by his daughter. He lives in [place] with his elderly wife. He has home caregivers as  well. Apparently on February 28, 2019 they noted blood on his sock and noticed a wound on the tip of his right great toe. He has been cared for by Dr. Posey Pronto who is his podiatrist in [place]. Apparently they have been using Betadine to this area. The patient has a thick mycotic nail on the right as well which is embedded in the wound in some areas. Fortunately the patient has diabetic neuropathy and is not really experiencing any pain otherwise I think this would all be quite painful. The patient was previously treated in 2019 for wounds on the left foot. This included a fairly thorough vascular review by vein and vascular Dr. Oneida Alar. His ABIs at that time were noncompressible bilaterally TBI on the right was 0.88 with biphasic waveforms on the  right and monophasic waveforms at the left. He ended up having amputations of the left first and second toes. He did have an angiogram on 06/25/2017 which showed on the right the right common femoral profundofemoral for Morris and superficial femoral arteries were widely patent the right popliteal was widely patent. Tibial vessels were not well opacified secondary to motion artifact however he was felt to have all 3 tibial vessels intact with some calcification and some areas of stenosis within the posterior tibial artery but relatively good flow to the right lower leg. He also had the left reviewed as well which was the source of the problem at that time. Past medical history; prostate cancer on oral antiandrogen therapy with bone mets, GI stromal tumor in 2005, type 2 diabetes with peripheral neuropathy, hypertension, atrial fibrillation on Eliquis, history of non-Hodgkin's lymphoma treated with CHOP in 2005. He had amputations of the left first and second toes secondary to osteomyelitis. Does not appear that he has had an x-ray of the right foot ABI in our clinic on the right was noncompressible [see full arterial discussion above]. 1/19; area on the tip of the right great toe under a very mycotic nail tip. We use silver alginate. 1/26; area of the tip of the right great toe under a very mycotic nail. A lot of this seems to have closed down we have been using silver alginate 2/9; the areas on the tip of the right great toe under a very mycotic nail. A lot of this continues to close down. I do not think there is a subungual problem. I am hopeful that this mycotic nail which is thick and raised will allow full epithelialization of the wound 2/23; this was a difficult area on the tip of his right great toe. Thick mycotic nail over the base of the wound. This appears to have closed down now. The nail itself is thick split with considerable subungual debris. READMISSION 07/25/2019 This is a 86 year old  man that we cared for earlier this year in the clinic without a wound on the tip of his right great toe under a very mycotic toenail. He is a type II diabetic. His ABIs have been noncompressible however he has been felt to have adequate blood flow in the right to heal the wound. We were able to get this wound to close over as I understand things he followed with Dr. Caprice Beaver had some debridement of the nail everything looks fine and apparently the wound bed was washed off by his wife over the weekend and by Monday it was open. His daughter is able to show me pictures. Unfortunately this now has exposed bone raising the possibility that he has had underlying osteomyelitis  all along. He has been using Silvadene cream on this he has been referred back to the clinic by Dr. Caprice Beaver. He had x-rays of the right foot that did not show evidence of osteomyelitis in the right foot. He has not been on antibiotics He also had an x-ray of the left foot that showed findings suspicious for osteomyelitis involving the residual base of the first proximal phalanx and the proximal phalanx of the left second toe as well as the left second metatarsal head however he has no wounds in this area. 4/12; culture from last time of bone in the right great toe was negative. The patient's wife apparently scrubbed this toe vigorously and according to his daughter reopen the wound however unfortunately it is all exposed bone last time. I removed some of this and we use Prisma unfortunately the area does not look much better today. Previous x-rays done in podiatry office Dr. Caprice Beaver did not show evidence of osteomyelitis. Nevertheless I think osteomyelitis here is probably quite likely. Still using Prisma 4/26; he took 2 weeks of doxycycline although his daughter is telling me he is not eating. He has loose bowel movements however I did not get the sense that this is changed all that much. He still has exposed bone. Culture I did last  time showed a few coag negative staph I do not think that this is necessarily relevant. I still am operating under the premise that the patient probably has osteomyelitis. After considerable difficulty we are able to determine he is not allergic to cephalosporins I therefore gave him Keflex. 5/10; is tolerating Keflex 500 3 times daily quite well. He still has the small wound in the tip of his toe with exposed bone.Marland Kitchen Unfortunately he does not really have any granulation tissue. He saw Dr. Caprice Beaver of podiatry in [place] last week I have not had a chance to look at his notes The patient had an extensive vascular work-up including an angiogram in 2019. He was felt to have adequate blood supply on the right at the time although he did have amputations of the left first and second toe by Dr. Oneida Alar. 09/11/19 -Patient is back and is about to finish his Keflex, appointment with Dr. Oneida Alar is in the works. The right great toe wound looks about the same, the left third toe dorsal wound has healed. There was a small eschar covering it that was removed by podiatry.According to patient's daughter the right great toe base on the plantar surface had some redness that seems to have disappeared. 10/13/2019; patient is back in clinic today after 1 month hiatus although I have not seen him since 5/10. Since he was last here he was admitted to hospital from 6/2 through 6/5 because of a nonhealing wound on his right great toe. He underwent an angiogram which showed patent common femoral, profunda, SFA above and below-knee popliteal artery. The dominant runoff in the right lower extremity was through the anterior tibial but that became very small at the level of the ankle and had minimal outflow into the foot. The peroneal artery was patent but atretic. The posterior tibial flow was much slower in the artery appeared to have multiple focal high-grade stenoses as well as short chronic total occlusion in the distal  segment. Interventions were attempted on the posterior tibial. Mechanical orbital atherectomy down to the level of the ankle and then this was angioplastied with 2.3 mm and 3 mm balloons. Flow remained very sluggish down the posterior tibial artery but  was patent. Post event there apparently was embolization of an area involving the plantar left heel and sides of the left heel. He arrives back in clinic today it has been almost 7 weeks since I have seen him. Miraculously the great toe area with open bone is closed over probably as result of Dr. Ainsley Spinner interventions. He does however have an extensive ischemic area on the plantar and medial aspect of his heel. I reviewed Dr. Ainsley Spinner last note from 3 days ago on 6/22. He did not think there was enough inflow to heal the heel wound. With eschar and sloughing. Wondered whether he could heal a below-knee amputation but more likely an above the knee amputation when required. He was placed on Xeroform to the right heel In discussing things with the patient's family and the patient present he is not in a lot of pain. The area does not appear to be grossly infected. The patient appears to be comfortable and conversational. He is eating and drinking fairly well. 7/9; the patient saw Dr. Carlis Abbott on 6/29. He had previously done heroic attempt to establish blood flow as described above. 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 Although the eschar on the heel looks ominous both plantar and medial it is certainly no worse than last week. We will continue Betadine to this area with alginate. Ultimately this will separate off and we will see about the viability of the tissue underneath. Also in clinic today our nurses were washing his right great toe tip apparently some of the skin came off eschared and there is an open wound at the tip of the great toe with  exposed bone similar to what he had previously 7/23; he has the open area on the tip of his first toe which I think is mostly bone. Then he has the embolic area on the plantar heel extending into the medial heel. We have been using Betadine and silver alginate to the heel silver alginate to the toe. His daughter brings up that she was given nitroglycerin patches by Dr. Donzetta Matters to try and open blood flow to the foot. She has not been using them out of concern of systemic hypotension and also that the labels said not to put them on extremities 8/10-Returns at 3 weeks, open area on the tip of his first toe that is dried out, the heel with eschar, applying Betadine to the eschar area and silver alginate dressing. Objective Constitutional alert and oriented x 3. sitting or standing blood pressure is within target range for patient.. supine blood pressure is within target range for patient.. pulse regular and within target range for patient.Marland Kitchen respirations regular, non-labored and within target range for patient.Marland Kitchen temperature within target range for patient.. Well- nourished and well-hydrated in no acute distress. Vitals Time Taken: 11:03 AM, Height: 70 in, Weight: 140 lbs, BMI: 20.1, Temperature: 97.7 F, Pulse: 64 bpm, Respiratory Rate: 18 breaths/min, Blood Pressure: 179/95 mmHg. General Notes: Right great toe wound is dry with bone exposed below, right heel eschar noted surrounding skin intact no redness Integumentary (Hair, Skin) Wound #2 status is Open. Original cause of wound was Gradually Appeared. The wound is located on the Right T Great. The wound measures 0.5cm length x oe 0.5cm width x 0.1cm depth; 0.196cm^2 area and 0.02cm^3 volume. There is no tunneling or undermining noted. There is a small amount of serosanguineous drainage noted. The wound margin is distinct with  the outline attached to the wound base. There is no granulation within the wound bed. There is a large (67- 100%) amount of  necrotic tissue within the wound bed including Eschar. Wound #3 status is Open. Original cause of wound was Gradually Appeared. The wound is located on the Right Calcaneus. The wound measures 6.8cm length x 6.9cm width x 0.3cm depth; 36.851cm^2 area and 11.055cm^3 volume. There is Fat Layer (Subcutaneous Tissue) Exposed exposed. There is no tunneling or undermining noted. There is a small amount of serosanguineous drainage noted. The wound margin is flat and intact. There is small (1-33%) pink granulation within the wound bed. There is a large (67-100%) amount of necrotic tissue within the wound bed including Eschar. Assessment Active Problems ICD-10 Type 2 diabetes mellitus with foot ulcer Non-pressure chronic ulcer of other part of right foot with necrosis of bone Type 2 diabetes mellitus with diabetic neuropathy, unspecified Plan Follow-up Appointments: Return appointment in 3 weeks. Dressing Change Frequency: Wound #2 Right T Great: oe Change dressing every day. - home health to change 3x/week and family to change on other days Wound #3 Right Calcaneus: Change dressing every day. - home health to change 3x/week and family to change on other days Wound Cleansing: Wound #2 Right T Great: oe May shower and wash wound with soap and water. - on days that dressing is changed Wound #3 Right Calcaneus: May shower and wash wound with soap and water. - on days that dressing is changed Primary Wound Dressing: Wound #2 Right T Great: oe Calcium Alginate with Silver Wound #3 Right Calcaneus: Calcium Alginate with Silver - over entire site Other: - betadine to eschar sites Secondary Dressing: Wound #3 Right Calcaneus: Kerlix/Rolled Gauze Dry Gauze Heel Cup Home Health: [facility] for wound care. - [facility] -Embolic wound to the right heel during vascular study -Right great toe ischemic ulcer -Continue Betadine to the eschar areas, silver alginate  dressing -Mostly palliative care -Return to clinic in 1 month Electronic Signature(s) Signed: 11/28/2019 11:44:46 AM By: Tobi Bastos MD, MBA Entered By: Tobi Bastos on 11/28/2019 11:44:45 -------------------------------------------------------------------------------- SuperBill Details Patient Name: Date of Service: [name] 11/28/2019 Medical Record Number: [id number] Patient Account Number: [id number] Date of Birth/Sex: Treating RN: Dec 18, 1923 (86 y.o. [name] Primary Care Provider: Sherrie Mustache Other Clinician: Referring Provider: Treating Provider/Extender: Heber Dundas in Treatment: 18 Diagnosis Coding ICD-10 Codes Code Description E11.621 Type 2 diabetes mellitus with foot ulcer L97.514 Non-pressure chronic ulcer of other part of right foot with necrosis of bone E11.40 Type 2 diabetes mellitus with diabetic neuropathy, unspecified Facility Procedures CPT4 Code: [id number] Description: 99213 - WOUND CARE VISIT-LEV 3 EST PT Modifier: Quantity: 1 Physician Procedures : CPT4 Code Description Modifier [id number] - WC PHYS LEVEL 3 - EST PT ICD-10 Diagnosis Description L97.514 Non-pressure chronic ulcer of other part of right foot with necrosis of bone Quantity: 1 Electronic Signature(s) Signed: 11/28/2019 4:52:06 PM By: Tobi Bastos MD, MBA Signed: 11/28/2019 5:05:11 PM By: Carlene Coria RN Previous Signature: 11/28/2019 11:44:59 AM Version By: Tobi Bastos MD, MBA Entered By: Carlene Coria on 11/28/2019 11:46:22

## 2019-11-29 ENCOUNTER — Other Ambulatory Visit: Payer: Self-pay

## 2019-11-29 ENCOUNTER — Inpatient Hospital Stay (HOSPITAL_BASED_OUTPATIENT_CLINIC_OR_DEPARTMENT_OTHER): Payer: Medicare Other | Admitting: Hematology

## 2019-11-29 ENCOUNTER — Inpatient Hospital Stay (HOSPITAL_COMMUNITY): Payer: Medicare Other

## 2019-11-29 VITALS — BP 138/69 | HR 68 | Temp 96.9°F | Resp 18 | Wt 140.7 lb

## 2019-11-29 DIAGNOSIS — E119 Type 2 diabetes mellitus without complications: Secondary | ICD-10-CM | POA: Diagnosis not present

## 2019-11-29 DIAGNOSIS — C61 Malignant neoplasm of prostate: Secondary | ICD-10-CM

## 2019-11-29 DIAGNOSIS — R5383 Other fatigue: Secondary | ICD-10-CM | POA: Diagnosis not present

## 2019-11-29 DIAGNOSIS — H9190 Unspecified hearing loss, unspecified ear: Secondary | ICD-10-CM | POA: Diagnosis not present

## 2019-11-29 DIAGNOSIS — R200 Anesthesia of skin: Secondary | ICD-10-CM | POA: Diagnosis not present

## 2019-11-29 DIAGNOSIS — C7951 Secondary malignant neoplasm of bone: Secondary | ICD-10-CM | POA: Diagnosis not present

## 2019-11-29 MED ORDER — DENOSUMAB 120 MG/1.7ML ~~LOC~~ SOLN
120.0000 mg | Freq: Once | SUBCUTANEOUS | Status: AC
  Administered 2019-11-29: 120 mg via SUBCUTANEOUS
  Filled 2019-11-29: qty 1.7

## 2019-11-29 NOTE — Progress Notes (Signed)
Patient is not taking calcium per Dr. Delton Coombes.  Denied tooth, jaw, and leg pain.  No recent or upcoming dental visits.  Patient tolerated Xgeva injection with no complaints.  Site clean and dry with no bruising or swelling noted at site.  Band aid applied.  VSS with discharge and left in a wheelchair with no s/s of distress noted.

## 2019-11-29 NOTE — Patient Instructions (Signed)
[place] at [facility] Discharge Instructions  You were seen today by Dr. Delton Coombes. He went over your recent results. You received your injection today; continue getting monthly injections. Dr. Delton Coombes will see you back in 2 months for labs and follow up.   Thank you for choosing [place] at [facility] to provide your oncology and hematology care.  To afford each patient quality time with our provider, please arrive at least 15 minutes before your scheduled appointment time.   If you have a lab appointment with the [facility] please come in thru the Main Entrance and check in at the main information desk  You need to re-schedule your appointment should you arrive 10 or more minutes late.  We strive to give you quality time with our providers, and arriving late affects you and other patients whose appointments are after yours.  Also, if you no show three or more times for appointments you may be dismissed from the clinic at the providers discretion.     Again, thank you for choosing [facility].  Our hope is that these requests will decrease the amount of time that you wait before being seen by our physicians.       _____________________________________________________________  Should you have questions after your visit to [facility], please contact our office at [phone] between the hours of 8:00 a.m. and 4:30 p.m.  Voicemails left after 4:00 p.m. will not be returned until the following business day.  For prescription refill requests, have your pharmacy contact our office and allow 72 hours.    Cancer Center Support Programs:   > Cancer Support Group  2nd Tuesday of the month 1pm-2pm, Journey Room

## 2019-11-29 NOTE — Progress Notes (Signed)
[name], [place] [address]   CLINIC:  Medical Oncology/Hematology  PCP:  [name] Chandler, NP [place]. 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  The Multi-Gene Panel offered by [facility] includes sequencing and/or deletion duplication testing of the following 80 genes: ALK, APC, ATM, AXIN2,BAP1,  BARD1, BLM, BMPR1A, BRCA1, BRCA2, BRIP1, CASR, CDC73, CDH1, CDK4, CDKN1B, CDKN1C, CDKN2A (p14ARF), CDKN2A (p16INK4a), CEBPA, CHEK2, CTNNA1, DICER1, DIS3L2, EGFR (c.2369C>T, p.Thr790Met variant only), EPCAM (Deletion/duplication testing only), FH, FLCN, GATA2, GPC3, GREM1 (Promoter region deletion/duplication testing only), HOXB13 (c.251G>A, p.Gly84Glu), HRAS, KIT, MAX, MEN1, MET, MITF (c.952G>A, p.Glu318Lys variant only), MLH1, MSH2, MSH3, MSH6, MUTYH, NBN, NF1, NF2, NTHL1, PALB2, PDGFRA, PHOX2B, PMS2, POLD1, POLE, POT1, PRKAR1A, PTCH1, PTEN, RAD50, RAD51C, RAD51D, RB1, RECQL4, RET, RUNX1, SDHAF2, SDHA (sequence changes only), SDHB, SDHC, SDHD, SMAD4, SMARCA4, SMARCB1, SMARCE1, STK11, SUFU, TERT, TERT, TMEM127, TP53, TSC1, TSC2, VHL, WRN and WT1.  The report date is February 08, 2017.    Prostate Foster ([place])  09/08/2013 Initial Diagnosis   Prostate Foster ([place])   02/08/2017 Genetic Testing   ATM Gain (Exons 62-63) VUS identified on the multi-gene panel.  The Multi-Gene Panel offered by [facility] includes sequencing and/or deletion duplication testing of the following 80 genes: ALK, APC, ATM, AXIN2,BAP1,  BARD1,  BLM, BMPR1A, BRCA1, BRCA2, BRIP1, CASR, CDC73, CDH1, CDK4, CDKN1B, CDKN1C, CDKN2A (p14ARF), CDKN2A (p16INK4a), CEBPA, CHEK2, CTNNA1, DICER1, DIS3L2, EGFR (c.2369C>T, p.Thr790Met variant only), EPCAM (Deletion/duplication testing only), FH, FLCN, GATA2, GPC3, GREM1 (Promoter region deletion/duplication testing only), HOXB13 (c.251G>A, p.Gly84Glu), HRAS, KIT, MAX, MEN1, MET, MITF (c.952G>A, p.Glu318Lys variant only), MLH1, MSH2, MSH3, MSH6, MUTYH, NBN, NF1, NF2, NTHL1, PALB2, PDGFRA, PHOX2B, PMS2, POLD1, POLE, POT1, PRKAR1A, PTCH1, PTEN, RAD50, RAD51C, RAD51D, RB1, RECQL4, RET, RUNX1, SDHAF2, SDHA (sequence changes only), SDHB, SDHC, SDHD, SMAD4, SMARCA4, SMARCB1, SMARCE1, STK11, SUFU, TERT, TERT, TMEM127, TP53, TSC1, TSC2, VHL, WRN and WT1.  The report date is February 08, 2017.      Foster STAGING: Foster Staging NHL (non-Hodgkin's lymphoma) (HCC) Staging form: Lymphoid Neoplasms, AJCC 6th Edition - Clinical: Stage III - Signed by [name] on 08/18/2011   INTERVAL HISTORY:  [name], a 86 y.o. 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 [name] was last seen on 08/24/2019.  He is accompanied by his daughter due to him being a poor historian and hard of hearing. He is having hot flashes, and his wounds are healing well. He continues taking Zytiga daily and tolerating it well.   REVIEW OF SYSTEMS:  Review of Systems  Constitutional: Positive for appetite change (moderately decreased) and fatigue (moderate).  HENT:   Positive for hearing loss and trouble swallowing.   Cardiovascular: Negative for leg swelling.  Neurological: Positive for numbness (feet).  All other systems reviewed and are negative.   PAST MEDICAL/SURGICAL HISTORY:  Past Medical History:  Diagnosis Date  . Acute bronchitis   . Acute lower UTI 11/25/2017  . Amputation of toe of left foot ([place]) 07/30/2017, 08/27/2017    Great toe  07/30/2017 Second toe 08/27/2017     . Arthritis   . Aspiration pneumonia ([place]) 01/31/2018  . Atrial fibrillation ([place])    Dr. Harl Bowie- [facility] follows saw 11'14  . Bladder stones    tx. with oral meds and antibiotics, now surgery planned  . Bone metastases ([place]) 08/21/2015  . Cataracts, both eyes    surgery planned May 2015  . Community acquired pneumonia 04/16/2018  . Cystoid macular edema 04/21/2015    Right eye 04/21/2015   . Dehydration 04/16/2018  . Dementia without behavioral disturbance ([place]) 04/16/2018  . Diabetes mellitus without complication ([place])    Type II  . Diarrhea 11/25/2017  . Dyspnea    with activity  . Dysrhythmia    Afib  . Family history of breast Foster   . Family history of colon Foster   . Genetic testing 02/09/2017  . GERD (gastroesophageal reflux disease)   . GIST (gastrointestinal stromal tumor), malignant ([place]) 2005   Gastrointestinal stromal tumor, that is GIST, small bowel, 4.5 cm, intermediate prognostic grade found on the PET scan in the small bowel, accounting for that small bowel activity in October 2005 with resection by Dr. Margot Chimes, thus far without recurrence.   Marland Kitchen History of MRI of lumbar spine 03/05/2014  . Hypertension   . Hypertrophy of prostate with urinary obstruction   . Hypomagnesemia 03/16/2017  . Metabolic encephalopathy 47/82/9562  . Metastatic adenocarcinoma to prostate ([place]) 09/08/2013  . 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 NHL (non-Hodgkin's lymphoma) ([place]) 2005   Diffuse large B-cell lymphoma, clinically stage IIIA, CD20 positive, status post cervical lymph node biopsy 08/03/2003 on the left. PET scan was also positive in the spleen and small bowel region, but bone marrow aspiration and biopsy were negative. So he essentially had stage IIIAs. He received R-CHOP x6 cycles with CR established by PET scan criteria on 11/23/2003 with no evidence for relapse th  . Numbness 02/19/2017   Per [facility] new patient packet  . Osteomyelitis ([place]) 06/29/2017   toe  of left foot   . PAD (peripheral artery disease) ([facility]) 08/27/2017  . Polyneuropathy 02/19/2017  . Postural dizziness with presyncope 02/19/2017  . S/P TURP 08/10/2013  . Sepsis ([facility]) 07/02/2018  . Skin Foster    Basal cell- face,head, neck,  arms, legs, Back  . Urinary frequency 02/19/2017   Past Surgical History:  Procedure Laterality Date  . ABDOMINAL AORTOGRAM W/LOWER EXTREMITY N/A 09/20/2019   Procedure: ABDOMINAL AORTOGRAM W/LOWER EXTREMITY;  Surgeon: Marty Heck, MD;  Location: [facility];  Service: Cardiovascular;  Laterality: N/A;  . AMPUTATION Left 06/29/2017   Procedure: AMPUTATION LEFT GREAT TOE;  Surgeon: [name], MD;  Location: [facility];  Service: Vascular;  Laterality: Left;  . AMPUTATION Left 08/27/2017   Procedure: AMPUTATION Left SECOND TOE;  Surgeon: [name], MD;  Location: [facility];  Service: Vascular;  Laterality: Left;  . CATARACT EXTRACTION Bilateral 2015  . CHOLECYSTECTOMY    . COLON RESECTION     small bowel  . CYSTOSCOPY WITH LITHOLAPAXY N/A 08/10/2013   Procedure: CYSTOSCOPY WITH LITHOLAPAXY WITH [name];  Surgeon: [name], MD;  Location: [facility];  Service: Urology;  Laterality: N/A;  . ESOPHAGEAL DILATION N/A 02/27/2016   Procedure: ESOPHAGEAL DILATION;  Surgeon: [name], MD;  [facility];  Service: Endoscopy;  Laterality: N/A;  . ESOPHAGEAL DILATION N/A 07/07/2018   Procedure: ESOPHAGEAL DILATION;  Surgeon: [name], MD;  [facility];  Service: Endoscopy;  Laterality: N/A;  . ESOPHAGOGASTRODUODENOSCOPY N/A 02/27/2016   Procedure: ESOPHAGOGASTRODUODENOSCOPY (EGD);  Surgeon: [name], MD;  [facility];  Service: Endoscopy;  Laterality: N/A;  12:00  . ESOPHAGOGASTRODUODENOSCOPY N/A 07/07/2018   Procedure: ESOPHAGOGASTRODUODENOSCOPY (EGD);  Surgeon: [name], MD;  [facility];  Service: Endoscopy;  Laterality: N/A;  . INCISION AND DRAINAGE  PERIRECTAL ABSCESS    . LOWER EXTREMITY ANGIOGRAPHY N/A 06/25/2017   Procedure: LOWER EXTREMITY ANGIOGRAPHY;  Surgeon: [name], MD;  Location: [facility];  Service: Cardiovascular;  Laterality: N/A;  . LYMPH NODE DISSECTION Left    '05-neck  . PERIPHERAL VASCULAR ATHERECTOMY Right 09/20/2019   Procedure: PERIPHERAL VASCULAR ATHERECTOMY;  Surgeon: Marty Heck, MD;  Location: [facility];  Service: Cardiovascular;  Laterality: Right;  Posterior tibial  . PORT-A-CATH REMOVAL    . PORTACATH PLACEMENT     insertion and removal -last chemotherapy 10 yrs ago  . TRANSURETHRAL RESECTION OF PROSTATE N/A 08/10/2013   Procedure: TRANSURETHRAL RESECTION OF THE PROSTATE WITH GYRUS INSTRUMENTS;  Surgeon: [name], MD;  [facility];  Service: Urology;  Laterality: N/A;    SOCIAL HISTORY:  Social History   Socioeconomic History  . Marital status: Married    Spouse name: Not on file  . Number of children: Not on file  . Years of education: Not on file  . Highest education level: Not on file  Occupational History  . Occupation: retired    Fish farm manager: RETIRED    Comment: salesman  Tobacco Use  . Smoking status: Never Smoker  . Smokeless tobacco: Never Used  Vaping Use  . Vaping Use: Never used  Substance and Sexual Activity  . Alcohol use: No  . Drug use: No  . Sexual activity: Not Currently  Other Topics Concern  . Not on file  Social History Narrative   Diet: Soft foods      Do you drink/eat things with caffeine No      Marital Status: Marred   What year were you married? 1949      Do you live in a house, apartment, assisted living, condo, trailer, etc.? House      Is it one or more stories? One      How many persons live in your home? 2         Do you have any pets in your home?(please list) No      Highest level of education completed: 9th      Current or past profession: [employer] you exercise? No Type and how often:       Living Will? Yes      DNR form? NO  If not do you wish to discuss one? Yes      POA/HPOA forms? Yes      Difficulty bathing or dressing yourself? Yes      Difficulty preparing food or eating? Yes      Difficulty managing medications? Yes      Difficulty managing your finances? Yes      Difficulty affording your medications? No                     Social Determinants of Health   Financial Resource Strain:   . Difficulty of Paying Living Expenses:   Food Insecurity:   . Worried About Charity fundraiser in the Last Year:   . [employer]  of Food in the Last Year:   Transportation Needs: No Transportation Needs  . Lack of Transportation (Medical): No  . Lack of Transportation (Non-Medical): No  Physical Activity:   . Days of Exercise per Week:   . Minutes of Exercise per Session:   Stress:   . Feeling of Stress :   Social Connections:   . Frequency of Communication with Friends and Family:   . Frequency of Social Gatherings with Friends and Family:   . Attends Religious Services:   . Active Member of Clubs or Organizations:   . Attends Archivist Meetings:   Marland Kitchen Marital Status:   Intimate Partner Violence:   . Fear of Current or Ex-Partner:   . Emotionally Abused:   Marland Kitchen Physically Abused:   . Sexually Abused:     FAMILY HISTORY:  Family History  Problem Relation Age of Onset  . Heart attack Mother        died in her 43s  . Other Father 48       pneumonia - died  . Breast Foster Sister        dx in her 4s-60s  . Colon Foster Brother        dx in his 71s  . Foster Sister        TAH/BSO due to "male Foster"  . Breast Foster Daughter 93  . Breast Foster Daughter 15       DCIS - ATM VUS on [facility] 83 gene panel in 2018  . Breast Foster Daughter 32       DCIS - Negative on [facility] My[facility] 25 gene apnel in 2015  . Thyroid Foster Daughter 2       Medullary and Papillary  . Thyroid nodules Grandchild     CURRENT MEDICATIONS:  Current Outpatient  Medications  Medication Sig Dispense Refill  . abiraterone acetate (ZYTIGA) 250 MG tablet Take 1 tablet (250 mg total) by mouth daily before breakfast. Take on an empty stomach 1 hour before or 2 hours after a meal 120 tablet 0  . acetaminophen (TYLENOL) 500 MG tablet Take 1 tablet (500 mg total) by mouth 3 (three) times daily. 30 tablet 0  . apixaban (ELIQUIS) 5 MG TABS tablet Take 5 mg by mouth 2 (two) times daily.    . blood glucose meter kit and supplies Dispense based on patient and insurance preference. Use four times daily as directed. (FOR ICD-10 E10.9, E11.9). 1 each 0  . Calcium Polycarbophil (FIBER-CAPS PO) Take 1 capsule by mouth in the morning and at bedtime.     . Cholecalciferol (VITAMIN D3) 1000 units CAPS Take 1,000 Units by mouth daily.     . clopidogrel (PLAVIX) 75 MG tablet Take 1 tablet (75 mg total) by mouth daily. 30 tablet 11  . clotrimazole-betamethasone (LOTRISONE) cream Apply 1 application topically 2 (two) times daily as needed (skin irriation.).     Marland Kitchen Cranberry 500 MG TABS Take 500 mg by mouth daily.     Marland Kitchen denosumab (XGEVA) 120 MG/1.7ML SOLN injection Inject 120 mg into the skin every 30 (thirty) days.    . diclofenac Sodium (VOLTAREN) 1 % GEL Apply 2 g topically 4 (four) times daily. 150 g 0  . diltiazem (CARDIZEM) 60 MG tablet Take 60 mg by mouth 2 (two) times daily.    Marland Kitchen donepezil (ARICEPT) 10 MG tablet Take 1 tablet by mouth once daily 90 tablet 0  . feeding supplement, ENSURE ENLIVE, (ENSURE ENLIVE) LIQD Take 237 mLs  by mouth every other day. CHOCOLATE     . fluticasone (FLONASE) 50 MCG/ACT nasal spray Place 2 sprays into both nostrils daily as needed (allergies.).     Marland Kitchen furosemide (LASIX) 20 MG tablet Take 20 mg by mouth daily as needed (retention/swelling.).     Marland Kitchen glucose blood (EASYMAX TEST) test strip Use as instructed 100 each 12  . Lancets 28G MISC 1 kit by Does not apply route every morning. 100 each 1  . Leuprolide Acetate, 6 Month, (LUPRON DEPOT,  61-MONTH,) 45 MG injection Inject 45 mg into the muscle every 6 (six) months.    Marland Kitchen lisinopril (ZESTRIL) 20 MG tablet Take 1 tablet (20 mg total) by mouth in the morning and at bedtime. 60 tablet 6  . metFORMIN (GLUCOPHAGE) 850 MG tablet Take 850 mg by mouth 2 (two) times daily with a meal.     . mirtazapine (REMERON) 15 MG tablet Take 1 tablet (15 mg total) by mouth at bedtime. 30 tablet 0  . Multiple Vitamin (MULTIVITAMIN WITH MINERALS) TABS tablet Take 1 tablet by mouth daily.     . nitroGLYCERIN (NITRODUR - DOSED IN MG/24 HR) 0.2 mg/hr patch Place 1 patch (0.2 mg total) onto the skin daily. 30 patch 12  . predniSONE (DELTASONE) 2.5 MG tablet Take 2.5 mg by mouth 2 (two) times daily with a meal.    . Probiotic Product (PROBIOTIC DAILY PO) Take 1 tablet by mouth daily. Every morning    . psyllium (METAMUCIL) 58.6 % packet Take 1 packet by mouth daily.    . traMADol (ULTRAM) 50 MG tablet Take 1 tablet (50 mg total) by mouth every 6 (six) hours as needed for moderate pain. 30 tablet 0  . trimethoprim (TRIMPEX) 100 MG tablet Take 1 tablet (100 mg total) by mouth at bedtime. 90 tablet 3   No current facility-administered medications for this visit.    ALLERGIES:  Allergies  Allergen Reactions  . Tape Other (See Comments)    SKIN IS VERY THIN AND WILL TEAR AND BRUISE VERY EASILY!!!!  . Augmentin [Amoxicillin-Pot Clavulanate] Rash and Other (See Comments)    Redness of skin    PHYSICAL EXAM:  Performance status (ECOG): 1 - Symptomatic but completely ambulatory  Vitals:   11/29/19 1115  BP: 138/69  Pulse: 68  Resp: 18  Temp: (!) 96.9 F (36.1 C)  SpO2: 100%   Wt Readings from Last 3 Encounters:  11/29/19 140 lb 11.2 oz (63.8 kg)  11/01/19 140 lb 3.4 oz (63.6 kg)  09/26/19 140 lb (63.5 kg)   Physical Exam Vitals reviewed.  Constitutional:      Appearance: Normal appearance.  Cardiovascular:     Rate and Rhythm: Normal rate and regular rhythm.     Pulses: Normal pulses.      Heart sounds: Murmur (systolic ejection murmur over aortic area) heard.   Pulmonary:     Effort: Pulmonary effort is normal.     Breath sounds: Normal breath sounds.  Abdominal:     Palpations: Abdomen is soft.     Tenderness: There is no abdominal tenderness.  Musculoskeletal:     Right lower leg: Edema (1+) present.     Left lower leg: Edema (1+) present.  Neurological:     General: No focal deficit present.     Mental Status: He is alert and oriented to person, place, and time.  Psychiatric:        Mood and Affect: Mood normal.  Behavior: Behavior normal.      LABORATORY DATA:  I have reviewed the labs as listed.  CBC Latest Ref Rng & Units 11/27/2019 11/01/2019 09/22/2019  WBC 4.0 - 10.5 K/uL 7.3 11.8(H) 12.5(H)  Hemoglobin 13.0 - 17.0 g/dL 10.6(L) 9.9(L) 9.0(L)  Hematocrit 39 - 52 % 33.6(L) 30.6(L) 27.1(L)  Platelets 150 - 400 K/uL 159 231 164   CMP Latest Ref Rng & Units 11/27/2019 11/01/2019 09/22/2019  Glucose 70 - 99 mg/dL 162(H) 161(H) 182(H)  BUN 8 - 23 mg/dL _0 Creatinine 0.61 - 1.24 mg/dL 0.79 0.76 1.21  Sodium 135 - 145 mmol/L 135 136 140  Potassium 3.5 - 5.1 mmol/L 4.2 4.4 4.9  Chloride 98 - 111 mmol/L 103 99 111  CO2 22 - 32 mmol/L 26 26 21(L)  Calcium 8.9 - 10.3 mg/dL 9.4 9.8 9.1  Total Protein 6.5 - 8.1 g/dL 6.5 6.7 4.9(L)  Total Bilirubin 0.3 - 1.2 mg/dL 0.4 0.5 0.8  Alkaline Phos 38 - 126 U/L 53 75 38  AST 15 - 41 U/L 13(L) 14(L) 16  ALT 0 - 44 U/L _1 Lab Results  Component Value Date   PSA 0.01 09/22/2016   PSA <0.01 08/25/2016   PSA <0.01 07/28/2016    DIAGNOSTIC IMAGING:  I have independently reviewed the scans and discussed with the patient. No results found.   ASSESSMENT:  1.  Metastatic prostate Foster to the bones: -Abiraterone and prednisone started in October 2016. -Bone scan on Aug 27, 2017 shows interval improvement in the T12 lesions.  No new lesions. -Abiraterone dose reduced 100 mg daily and prednisone 2.5 mg daily  on February 10, 2018. -Abiraterone was held for a month but his symptoms of abdominal and leg pains did not improve.  His energy levels also did not improve. -He was started back on abiraterone 500 mg daily on August 09, 2019 along with prednisone. -Current dose of Zytiga is to 50 mg daily.  2.  IgM MGUS: -SPEP on January 25, 2019 shows M spike 0.4 g.  Light chain ratio is 0.91.   PLAN:  1.  Metastatic prostate Foster to the bones: -He is tolerating 250 mg of Zytiga daily very well. -Continue prednisone 2.5 mg twice daily. -Labs reviewed shows undetectable PSA on 11/01/2019.  LFTs today are within normal limits. -He will continue Zytiga.  He will be seen back in 2 months for follow-up.  2.  Bone metastasis: -Calcium today is 9.4.  Continue monthly denosumab.    Orders placed this encounter:  No orders of the defined types were placed in this encounter.    [name] Jack, MD [place] [phone]   I, [name], am acting as a scribe for Dr. Sanda Linger.  I, [name] Jack MD, have reviewed the above documentation for accuracy and completeness, and I agree with the above.

## 2019-12-01 NOTE — Progress Notes (Signed)
BARRON, VANLOAN ([id number]) Visit Report for 11/28/2019 Arrival Information Details Patient Name: Date of Service: [name] 11/28/2019 10:45 A M Medical Record Number: [id number] Patient Account Number: [id number] Date of Birth/Sex: Treating RN: 1923-11-25 (86 y.o. [name]) Carlene Coria Primary Care Deryn Massengale: Sherrie Mustache Other Clinician: Referring Edwardo Wojnarowski: Treating Ilma Achee/Extender: Heber [place] in Treatment: 18 Visit Information History Since Last Visit Added or deleted any medications: No Patient Arrived: Wheel Chair Any new allergies or adverse reactions: No Arrival Time: 11:02 Had a fall or experienced change in No Accompanied By: daughter/caregiver activities of daily living that may affect Transfer Assistance: Manual risk of falls: Patient Identification Verified: Yes Signs or symptoms of abuse/neglect since last visito No Secondary Verification Process Completed: Yes Hospitalized since last visit: No Patient Requires Transmission-Based Precautions: No Implantable device outside of the clinic excluding No Patient Has Alerts: Yes cellular tissue based products placed in the center Patient Alerts: R ABI non compressible since last visit: Has Dressing in Place as Prescribed: Yes Pain Present Now: No Electronic Signature(s) Signed: 12/01/2019 1:13:21 PM By: Sandre Kitty Entered By: Sandre Kitty on 11/28/2019 11:03:30 -------------------------------------------------------------------------------- Clinic Level of Care Assessment Details Patient Name: Date of Service: [name] 11/28/2019 10:45 A M Medical Record Number: [id number] Patient Account Number: [id number] Date of Birth/Sex: Treating RN: 1923-11-13 (86 y.o. [name]) Carlene Coria Primary Care [name]: Sherrie Mustache Other Clinician: Referring Meziah Blasingame: Treating Elleigh Cassetta/Extender: Heber [place] in Treatment: 18 Clinic Level of Care Assessment  Items TOOL 4 Quantity Score X- 1 0 Use when only an EandM is performed on FOLLOW-UP visit ASSESSMENTS - Nursing Assessment / Reassessment X- 1 10 Reassessment of Co-morbidities (includes updates in patient status) X- 1 5 Reassessment of Adherence to Treatment Plan ASSESSMENTS - Wound and Skin A ssessment / Reassessment []  - 0 Simple Wound Assessment / Reassessment - one wound X- 2 5 Complex Wound Assessment / Reassessment - multiple wounds []  - 0 Dermatologic / Skin Assessment (not related to wound area) ASSESSMENTS - Focused Assessment []  - 0 Circumferential Edema Measurements - multi extremities []  - 0 Nutritional Assessment / Counseling / Intervention []  - 0 Lower Extremity Assessment (monofilament, tuning fork, pulses) []  - 0 Peripheral Arterial Disease Assessment (using hand held doppler) ASSESSMENTS - Ostomy and/or Continence Assessment and Care []  - 0 Incontinence Assessment and Management []  - 0 Ostomy Care Assessment and Management (repouching, etc.) PROCESS - Coordination of Care X - Simple Patient / Family Education for ongoing care 1 15 []  - 0 Complex (extensive) Patient / Family Education for ongoing care X- 1 10 Staff obtains Programmer, systems, Records, T Results / Process Orders est []  - 0 Staff telephones HHA, Nursing Homes / Clarify orders / etc []  - 0 Routine Transfer to another Facility (non-emergent condition) []  - 0 Routine Hospital Admission (non-emergent condition) []  - 0 New Admissions / Biomedical engineer / Ordering NPWT Apligraf, etc. , []  - 0 Emergency Hospital Admission (emergent condition) X- 1 10 Simple Discharge Coordination []  - 0 Complex (extensive) Discharge Coordination PROCESS - Special Needs []  - 0 Pediatric / Minor Patient Management []  - 0 Isolation Patient Management []  - 0 Hearing / Language / Visual special needs []  - 0 Assessment of Community assistance (transportation, D/C planning, etc.) []  - 0 Additional  assistance / Altered mentation []  - 0 Support Surface(s) Assessment (bed, cushion, seat, etc.) INTERVENTIONS - Wound Cleansing / Measurement []  - 0 Simple Wound Cleansing - one wound X- 2  5 Complex Wound Cleansing - multiple wounds X- 1 5 Wound Imaging (photographs - any number of wounds) []  - 0 Wound Tracing (instead of photographs) []  - 0 Simple Wound Measurement - one wound X- 2 5 Complex Wound Measurement - multiple wounds INTERVENTIONS - Wound Dressings []  - 0 Small Wound Dressing one or multiple wounds []  - 0 Medium Wound Dressing one or multiple wounds X- 1 20 Large Wound Dressing one or multiple wounds X- 1 5 Application of Medications - topical []  - 0 Application of Medications - injection INTERVENTIONS - Miscellaneous []  - 0 External ear exam []  - 0 Specimen Collection (cultures, biopsies, blood, body fluids, etc.) []  - 0 Specimen(s) / Culture(s) sent or taken to Lab for analysis []  - 0 Patient Transfer (multiple staff / Civil Service fast streamer / Similar devices) []  - 0 Simple Staple / Suture removal (25 or less) []  - 0 Complex Staple / Suture removal (26 or more) []  - 0 Hypo / Hyperglycemic Management (close monitor of Blood Glucose) []  - 0 Ankle / Brachial Index (ABI) - do not check if billed separately X- 1 5 Vital Signs Has the patient been seen at the hospital within the last three years: Yes Total Score: 115 Level Of Care: New/Established - Level 3 Electronic Signature(s) Signed: 11/28/2019 5:05:11 PM By: Carlene Coria RN Entered By: Carlene Coria on 11/28/2019 11:45:51 -------------------------------------------------------------------------------- Encounter Discharge Information Details Patient Name: Date of Service: [name] 11/28/2019 10:45 A M Medical Record Number: [id number] Patient Account Number: [id number] Date of Birth/Sex: Treating RN: 11-26-1923 (86 y.o. [name] Primary Care Catherina Pates: Sherrie Mustache Other Clinician: Referring  Kirstan Fentress: Treating [name]/Extender: Heber Kibler in Treatment: 18 Encounter Discharge Information Items Discharge Condition: Stable Ambulatory Status: Wheelchair Discharge Destination: Home Transportation: Private Auto Accompanied By: daughter Schedule Follow-up Appointment: Yes Clinical Summary of Care: Patient Declined Electronic Signature(s) Signed: 12/01/2019 5:19:54 PM By: Levan Hurst RN, BSN Entered By: Levan Hurst on 11/28/2019 11:58:42 -------------------------------------------------------------------------------- Lower Extremity Assessment Details Patient Name: Date of Service: [name] 11/28/2019 10:45 A M Medical Record Number: [id number] Patient Account Number: [id number] Date of Birth/Sex: Treating RN: 1924-03-01 (86 y.o. [name] Primary Care Dalton Molesworth: Sherrie Mustache Other Clinician: Referring Raza Bayless: Treating Tanmay Halteman/Extender: Heber [place] in Treatment: 18 Edema Assessment Assessed: Shirlyn Goltz: No] Patrice Paradise: No] Edema: [Left: N] [Right: o] Calf Left: Right: Point of Measurement: cm From Medial Instep cm 29.5 cm Ankle Left: Right: Point of Measurement: cm From Medial Instep cm 23.5 cm Vascular Assessment Pulses: Dorsalis Pedis Palpable: [Right:No] Electronic Signature(s) Signed: 12/01/2019 5:19:54 PM By: Levan Hurst RN, BSN Entered By: Levan Hurst on 11/28/2019 11:08:01 -------------------------------------------------------------------------------- [place] Details Patient Name: Date of Service: [name] 11/28/2019 10:45 A M Medical Record Number: [id number] Patient Account Number: [id number] Date of Birth/Sex: Treating RN: 03/29/1924 (86 y.o. [name] Primary Care Henderson Frampton: Sherrie Mustache Other Clinician: Referring Malayjah Otoole: Treating Francia Verry/Extender: Heber [place] in Treatment: 18 Active  Inactive Wound/Skin Impairment Nursing Diagnoses: Knowledge deficit related to ulceration/compromised skin integrity Goals: Patient/caregiver will verbalize understanding of skin care regimen Date Initiated: 07/25/2019 Target Resolution Date: 12/08/2019 Goal Status: Active Ulcer/skin breakdown will have a volume reduction of 30% by week 4 Date Initiated: 07/25/2019 Date Inactivated: 08/28/2019 Target Resolution Date: 08/25/2019 Goal Status: Unmet Unmet Reason: Osteomyelitis Interventions: Assess patient/caregiver ability to obtain necessary supplies Assess patient/caregiver ability to perform ulcer/skin care regimen upon admission and as needed Assess  ulceration(s) every visit Notes: Electronic Signature(s) Signed: 11/28/2019 5:05:11 PM By: Carlene Coria RN Entered By: Carlene Coria on 11/28/2019 11:33:23 -------------------------------------------------------------------------------- Pain Assessment Details Patient Name: Date of Service: [name] 11/28/2019 10:45 A M Medical Record Number: [id number] Patient Account Number: [id number] Date of Birth/Sex: Treating RN: [birth year] (86 y.o. [name]) Carlene Coria Primary Care [place] Basic: Sherrie Mustache Other Clinician: Referring Kyndall Amero: Treating Rozena Fierro/Extender: Heber [place] in Treatment: 18 Active Problems Location of Pain Severity and Description of Pain Patient Has Paino No Patient Has Paino No Site Locations Pain Management and Medication Current Pain Management: Electronic Signature(s) Signed: 11/28/2019 5:05:11 PM By: Carlene Coria RN Signed: 12/01/2019 1:13:21 PM By: Sandre Kitty Entered By: Sandre Kitty on 11/28/2019 11:03:53 -------------------------------------------------------------------------------- Patient/Caregiver Education Details Patient Name: Date of Service: [name] 8/10/2021andnbsp10:45 A M Medical Record Number: [id number] Patient Account Number:  [id number] Date of Birth/Gender: Treating RN: 1923-07-22 (86 y.o. [name] Primary Care Physician: Sherrie Mustache Other Clinician: Referring Physician: Treating Physician/Extender: Heber [place] in Treatment: 18 Education Assessment Education Provided To: Patient Education Topics Provided Wound/Skin Impairment: Methods: Explain/Verbal Responses: State content correctly Electronic Signature(s) Signed: 11/28/2019 5:05:11 PM By: Carlene Coria RN Entered By: Carlene Coria on 11/28/2019 11:33:40 -------------------------------------------------------------------------------- Wound Assessment Details Patient Name: Date of Service: [name] 11/28/2019 10:45 A M Medical Record Number: [id number] Patient Account Number: [id number] Date of Birth/Sex: Treating RN: 06-18-1923 (86 y.o. [name]) Carlene Coria Primary Care Keiandra Sullenger: Other Clinician: Sherrie Mustache Referring Lavone Barrientes: Treating Creola Krotz/Extender: Heber [place] in Treatment: 18 Wound Status Wound Number: 2 Primary Diabetic Wound/Ulcer of the Lower Extremity Etiology: Wound Location: Right T Great oe Wound Open Wounding Event: Gradually Appeared Status: Date Acquired: 06/15/2019 Comorbid Cataracts, Arrhythmia, Hypertension, Peripheral Arterial Disease, Weeks Of Treatment: 18 History: Type II Diabetes, Osteomyelitis, Dementia, Neuropathy, Received Clustered Wound: No Chemotherapy Photos Photo Uploaded By: Mikeal Hawthorne on 11/29/2019 09:44:28 Wound Measurements Length: (cm) 0.5 Width: (cm) 0.5 Depth: (cm) 0.1 Area: (cm) 0.196 Volume: (cm) 0.02 % Reduction in Area: -108.5% % Reduction in Volume: 28.6% Epithelialization: Small (1-33%) Tunneling: No Undermining: No Wound Description Classification: Grade 2 Wound Margin: Distinct, outline attached Exudate Amount: Small Exudate Type: Serosanguineous Exudate Color: red, brown Foul Odor After Cleansing:  No Slough/Fibrino No Wound Bed Granulation Amount: None Present (0%) Exposed Structure Necrotic Amount: Large (67-100%) Fascia Exposed: No Necrotic Quality: Eschar Fat Layer (Subcutaneous Tissue) Exposed: No Tendon Exposed: No Muscle Exposed: No Joint Exposed: No Bone Exposed: No Treatment Notes Wound #2 (Right Toe Great) 1. Cleanse With Wound Cleanser 3. Primary Dressing Applied Calcium Alginate Ag Other primary dressing (specifiy in notes) 4. Secondary Dressing Dry Gauze Roll Gauze Heel Cup Notes betadine to eschar Electronic Signature(s) Signed: 11/28/2019 5:05:11 PM By: Carlene Coria RN Signed: 12/01/2019 5:19:54 PM By: Levan Hurst RN, BSN Entered By: Levan Hurst on 11/28/2019 11:10:00 -------------------------------------------------------------------------------- Wound Assessment Details Patient Name: Date of Service: [name] 11/28/2019 10:45 A M Medical Record Number: [id number] Patient Account Number: [id number] Date of Birth/Sex: Treating RN: 1923-10-13 (86 y.o. [name] Primary Care Anoop Hemmer: Sherrie Mustache Other Clinician: Referring Nieves Barberi: Treating Muscab Brenneman/Extender: Heber [place] in Treatment: 18 Wound Status Wound Number: 3 Primary Pressure Ulcer Etiology: Wound Location: Right Calcaneus Secondary Arterial Insufficiency Ulcer Wounding Event: Gradually Appeared Etiology: Date Acquired: 09/21/2019 Wound Open Weeks Of Treatment: 6 Status: Clustered Wound: No Comorbid Cataracts, Arrhythmia, Hypertension, Peripheral Arterial Disease, History: Type II Diabetes,  Osteomyelitis, Dementia, Neuropathy, Received Chemotherapy Photos Photo Uploaded By: Mikeal Hawthorne on 11/29/2019 09:44:29 Wound Measurements Length: (cm) 6.8 % Re Width: (cm) 6.9 % Re Depth: (cm) 0.3 Epit Area: (cm) 36.851 Tun Volume: (cm) 11.055 Und duction in Area: 14.1% duction in Volume: -157.8% helialization: Medium  (34-66%) neling: No ermining: No Wound Description Classification: Unstageable/Unclassified Fou Wound Margin: Flat and Intact Slo Exudate Amount: Small Exudate Type: Serosanguineous Exudate Color: red, brown l Odor After Cleansing: No ugh/Fibrino No Wound Bed Granulation Amount: Small (1-33%) Exposed Structure Granulation Quality: Pink Fascia Exposed: No Necrotic Amount: Large (67-100%) Fat Layer (Subcutaneous Tissue) Exposed: Yes Necrotic Quality: Eschar Tendon Exposed: No Muscle Exposed: No Joint Exposed: No Bone Exposed: No Treatment Notes Wound #3 (Right Calcaneus) 1. Cleanse With Wound Cleanser 3. Primary Dressing Applied Calcium Alginate Ag Other primary dressing (specifiy in notes) 4. Secondary Dressing Dry Gauze Roll Gauze Heel Cup Notes betadine to eschar Electronic Signature(s) Signed: 11/28/2019 5:05:11 PM By: Carlene Coria RN Signed: 12/01/2019 5:19:54 PM By: Levan Hurst RN, BSN Entered By: Levan Hurst on 11/28/2019 11:09:45 -------------------------------------------------------------------------------- Vitals Details Patient Name: Date of Service: [name] 11/28/2019 10:45 A M Medical Record Number: [id number] Patient Account Number: [id number] Date of Birth/Sex: Treating RN: Jul 17, 1923 (86 y.o. [name]) Carlene Coria Primary Care Brissa Asante: Sherrie Mustache Other Clinician: Referring Starlene Consuegra: Treating Shep Porter/Extender: Heber [place] in Treatment: 18 Vital Signs Time Taken: 11:03 Temperature (F): 97.7 Height (in): 70 Pulse (bpm): 64 Weight (lbs): 140 Respiratory Rate (breaths/min): 18 Body Mass Index (BMI): 20.1 Blood Pressure (mmHg): 179/95 Reference Range: 80 - 120 mg / dl Electronic Signature(s) Signed: 12/01/2019 1:13:21 PM By: Sandre Kitty Entered By: Sandre Kitty on 11/28/2019 11:03:48

## 2019-12-01 NOTE — Telephone Encounter (Signed)
BP's still elevated, can we increase his diltiazem to 90mg  bid , continue lisinopril   Zandra Abts MD

## 2019-12-05 ENCOUNTER — Other Ambulatory Visit: Payer: Self-pay | Admitting: *Deleted

## 2019-12-05 NOTE — Patient Outreach (Signed)
[facility]) Care Management  12/05/2019  [name] 07-05-1923 [id number]   EMMI General Discharge follow up call  Original EMMI  Red Flag  Day: 4 Date: 09/29/19 Referral reasons : Know who to call about changes ? - No Wounds healing : No Lost Interest in things: yes Sad/Hopeless/anxious/empty? - Yes   Subjective: Successful follow up call to patient daughter, [name] , daughter, designated party release. Introduced self following up for assigned Case Manager [employer]. Discussed prior CM  follow up on initial EMMI red flags  [name] reports that patient is doing well. She discussed that patient foot is healing, recent visit at wound center with improvement and next follow up in a month versus every 2 weeks. She reports that the toe is not healing as well but not any worse. Daughter discussed patient does not complain of pain. She has began to wean down the Tramadol to 1/2 tablet at night and patient is resting well, she reports that she plans to continue with that schedule for now , explained that medication is used as needed for pain. She will discuss with vascular MD at visit on next month.   .   Daughter and family able to manage wound care at home. Patient continues to have personal care support in the home.  Patient continues to attend all medical follow up visits.  Daughter reports that they continue to monitor patient blood pressures daily at home, keeping in touch with MD regarding changes, she reports recent change in medication and believes reading or trending in better direction, reviewed recent reading of 134-155/90 with occasional 155/100, reinforced to take log to next MD visit.   Daughter denies need for additional education material, community resource needs.  Daughter discussed that family works together in caring for patient, and they are in a better place and after months she plans to return to her home in [place] this weekend.    Objective: Per KPN (Knowledge Performance Now, point of care tool) and chart review,patient hospitalized 09/20/2019 - 09/23/2019 forVagal reaction,orthostatic hypotension, PAD,nonhealing wound of right great, status postUltrasound-guided access of left common femoral artery,Aortogram including catheter selection of aorta,Bilateral lower extremity arteriogram including selection of third order branches in the right lower extremity,Right posterior tibial artery mechanical orbital atherectomy,Right posterior tibial artery angioplasty,Mynx closure of the left common femoral artery. Patient also has a history of the following: hypertension, skin cancer, pneumonia ( healthcare-associated,community acquired,Aspiration),Type 1 diabetes mellitus with diabetic neuropathy,Sepsis secondary to UTI,Dementia without behavioral disturbance,Great toe amputation status, left (07/30/2017), amputation of 2nd toe left foot (08/27/2017),Atrial fibrillation,Bladder stones,Cataracts, both eyes,Bone metastases,GIST (gastrointestinal stromal tumor), malignant(2005),Hypertrophy of prostate with urinary obstruction,Metabolic encephalopathy,Metastatic adenocarcinoma to prostate,NHL (non-Hodgkin's lymphoma), andOsteomyelitistoe left foot.   Plan Will close to [facility] care management as no further care management,  needs identified.    [name] Draft, [name], BSN  [facility] Management Coordinator  [phone]- Mobile [phone]- Toll Free Main Office

## 2019-12-06 DIAGNOSIS — L570 Actinic keratosis: Secondary | ICD-10-CM | POA: Diagnosis not present

## 2019-12-06 DIAGNOSIS — L820 Inflamed seborrheic keratosis: Secondary | ICD-10-CM | POA: Diagnosis not present

## 2019-12-06 DIAGNOSIS — X32XXXD Exposure to sunlight, subsequent encounter: Secondary | ICD-10-CM | POA: Diagnosis not present

## 2019-12-07 DIAGNOSIS — E11621 Type 2 diabetes mellitus with foot ulcer: Secondary | ICD-10-CM | POA: Diagnosis not present

## 2019-12-07 DIAGNOSIS — C8581 Other specified types of non-Hodgkin lymphoma, lymph nodes of head, face, and neck: Secondary | ICD-10-CM | POA: Diagnosis not present

## 2019-12-07 DIAGNOSIS — E1142 Type 2 diabetes mellitus with diabetic polyneuropathy: Secondary | ICD-10-CM | POA: Diagnosis not present

## 2019-12-07 DIAGNOSIS — E1151 Type 2 diabetes mellitus with diabetic peripheral angiopathy without gangrene: Secondary | ICD-10-CM | POA: Diagnosis not present

## 2019-12-07 DIAGNOSIS — C61 Malignant neoplasm of prostate: Secondary | ICD-10-CM | POA: Diagnosis not present

## 2019-12-07 DIAGNOSIS — L97514 Non-pressure chronic ulcer of other part of right foot with necrosis of bone: Secondary | ICD-10-CM | POA: Diagnosis not present

## 2019-12-10 ENCOUNTER — Other Ambulatory Visit (HOSPITAL_COMMUNITY): Payer: Self-pay | Admitting: Nurse Practitioner

## 2019-12-11 DIAGNOSIS — E11621 Type 2 diabetes mellitus with foot ulcer: Secondary | ICD-10-CM | POA: Diagnosis not present

## 2019-12-11 DIAGNOSIS — L97514 Non-pressure chronic ulcer of other part of right foot with necrosis of bone: Secondary | ICD-10-CM | POA: Diagnosis not present

## 2019-12-11 DIAGNOSIS — C8581 Other specified types of non-Hodgkin lymphoma, lymph nodes of head, face, and neck: Secondary | ICD-10-CM | POA: Diagnosis not present

## 2019-12-11 DIAGNOSIS — E1142 Type 2 diabetes mellitus with diabetic polyneuropathy: Secondary | ICD-10-CM | POA: Diagnosis not present

## 2019-12-11 DIAGNOSIS — C61 Malignant neoplasm of prostate: Secondary | ICD-10-CM | POA: Diagnosis not present

## 2019-12-11 DIAGNOSIS — E1151 Type 2 diabetes mellitus with diabetic peripheral angiopathy without gangrene: Secondary | ICD-10-CM | POA: Diagnosis not present

## 2019-12-12 ENCOUNTER — Other Ambulatory Visit: Payer: Self-pay | Admitting: *Deleted

## 2019-12-12 ENCOUNTER — Telehealth: Payer: Self-pay | Admitting: *Deleted

## 2019-12-12 DIAGNOSIS — R413 Other amnesia: Secondary | ICD-10-CM

## 2019-12-12 MED ORDER — DONEPEZIL HCL 10 MG PO TABS: 10.0000 mg | ORAL_TABLET | Freq: Every day | ORAL | 1 refills | Status: DC

## 2019-12-12 NOTE — Telephone Encounter (Signed)
Received fax from pharmacy for refill Request.

## 2019-12-12 NOTE — Telephone Encounter (Signed)
Called and notified daughter [name] that Eliquis as arrived in office.

## 2019-12-14 ENCOUNTER — Ambulatory Visit: Payer: Medicare Other | Admitting: Cardiology

## 2019-12-14 DIAGNOSIS — C61 Malignant neoplasm of prostate: Secondary | ICD-10-CM | POA: Diagnosis not present

## 2019-12-14 DIAGNOSIS — E1142 Type 2 diabetes mellitus with diabetic polyneuropathy: Secondary | ICD-10-CM | POA: Diagnosis not present

## 2019-12-14 DIAGNOSIS — E1151 Type 2 diabetes mellitus with diabetic peripheral angiopathy without gangrene: Secondary | ICD-10-CM | POA: Diagnosis not present

## 2019-12-14 DIAGNOSIS — E11621 Type 2 diabetes mellitus with foot ulcer: Secondary | ICD-10-CM | POA: Diagnosis not present

## 2019-12-14 DIAGNOSIS — I4821 Permanent atrial fibrillation: Secondary | ICD-10-CM | POA: Diagnosis not present

## 2019-12-14 DIAGNOSIS — Z7984 Long term (current) use of oral hypoglycemic drugs: Secondary | ICD-10-CM | POA: Diagnosis not present

## 2019-12-14 DIAGNOSIS — I10 Essential (primary) hypertension: Secondary | ICD-10-CM | POA: Diagnosis not present

## 2019-12-14 DIAGNOSIS — L89152 Pressure ulcer of sacral region, stage 2: Secondary | ICD-10-CM | POA: Diagnosis not present

## 2019-12-14 DIAGNOSIS — C8581 Other specified types of non-Hodgkin lymphoma, lymph nodes of head, face, and neck: Secondary | ICD-10-CM | POA: Diagnosis not present

## 2019-12-14 DIAGNOSIS — Z89422 Acquired absence of other left toe(s): Secondary | ICD-10-CM | POA: Diagnosis not present

## 2019-12-14 DIAGNOSIS — L97514 Non-pressure chronic ulcer of other part of right foot with necrosis of bone: Secondary | ICD-10-CM | POA: Diagnosis not present

## 2019-12-14 DIAGNOSIS — Z7901 Long term (current) use of anticoagulants: Secondary | ICD-10-CM | POA: Diagnosis not present

## 2019-12-15 ENCOUNTER — Encounter: Payer: Self-pay | Admitting: Cardiology

## 2019-12-15 ENCOUNTER — Other Ambulatory Visit: Payer: Self-pay

## 2019-12-15 ENCOUNTER — Telehealth (INDEPENDENT_AMBULATORY_CARE_PROVIDER_SITE_OTHER): Payer: Medicare Other | Admitting: Cardiology

## 2019-12-15 VITALS — BP 188/90 | HR 84 | Ht 68.0 in | Wt 145.0 lb

## 2019-12-15 DIAGNOSIS — I10 Essential (primary) hypertension: Secondary | ICD-10-CM

## 2019-12-15 DIAGNOSIS — I4891 Unspecified atrial fibrillation: Secondary | ICD-10-CM | POA: Diagnosis not present

## 2019-12-15 MED ORDER — DILTIAZEM HCL ER 90 MG PO CP12: 90.0000 mg | ORAL_CAPSULE | Freq: Two times a day (BID) | ORAL | 11 refills | Status: DC

## 2019-12-15 NOTE — Addendum Note (Signed)
Addended by: Levonne Hubert on: 12/15/2019 10:12 AM   Modules accepted: Orders

## 2019-12-15 NOTE — Progress Notes (Signed)
Virtual Visit via Telephone Note   This visit type was conducted due to national recommendations for restrictions regarding the COVID-19 Pandemic (e.g. social distancing) in an effort to limit this patient's exposure and mitigate transmission in our community.  Due to his co-morbid illnesses, this patient is at least at moderate risk for complications without adequate follow up.  This format is felt to be most appropriate for this patient at this time.  The patient did not have access to video technology/had technical difficulties with video requiring transitioning to audio format only (telephone).  All issues noted in this document were discussed and addressed.  No physical exam could be performed with this format.  Please refer to the patient's chart for his  consent to telehealth for [facility].    Date:  12/15/2019   ID:  [name], DOB November 05, 1923, MRN [id number] The patient was identified using 2 identifiers.  Patient Location: Home Provider Location: Office/Clinic  PCP:  [name], NP  Cardiologist:  Carlyle Dolly, MD  Electrophysiologist:  None   Evaluation Performed:  Follow-Up Visit  Chief Complaint:  FOllow up visit  History of Present Illness:    [name] is a 86 y.o. male seen today for follow up of the following medical problems.   1. Atrial fibrillation   - no recent palpitations - no bleeding on eliquis  2. HTN  -recent issues with bp's - home bp's provided through mychart show systolics stills [id number] - he has had some prior orthostatic symptoms in the past - compliant with meds   3. Prostate cancer with bone mets - followed by onc    4. Mild aortic stenosis - by echo 01/2018 - no recent symptoms    5. Foot sore - followed by wound care - followed by podiatrist.  - partial amputation  SH: his wife [name] is also a patient of mine.   Had covid vaccine x 2 at [facility].   The patient does not  have symptoms concerning for COVID-19 infection (fever, chills, cough, or new shortness of breath).    Past Medical History:  Diagnosis Date  . Acute bronchitis   . Acute lower UTI 11/25/2017  . Amputation of toe of left foot ([place]) 07/30/2017, 08/27/2017    Great toe 07/30/2017 Second toe 08/27/2017   . Arthritis   . Aspiration pneumonia ([place]) 01/31/2018  . Atrial fibrillation ([place])    Dr. Harl Bowie- [place] follows saw 11'14  . Bladder stones    tx. with oral meds and antibiotics, now surgery planned  . Bone metastases ([place]) 08/21/2015  . Cataracts, both eyes    surgery planned May 2015  . Community acquired pneumonia 04/16/2018  . Cystoid macular edema 04/21/2015    Right eye 04/21/2015   . Dehydration 04/16/2018  . Dementia without behavioral disturbance ([place]) 04/16/2018  . Diabetes mellitus without complication ([place])    Type II  . Diarrhea 11/25/2017  . Dyspnea    with activity  . Dysrhythmia    Afib  . Family history of breast cancer   . Family history of colon cancer   . Genetic testing 02/09/2017  . GERD (gastroesophageal reflux disease)   . 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 Margot Chimes, thus far without recurrence.   Marland Kitchen  History of MRI of lumbar spine 03/05/2014  . Hypertension   . Hypertrophy of prostate with urinary obstruction   . Hypomagnesemia 03/16/2017  . Metabolic encephalopathy 70/48/8891  . Metastatic adenocarcinoma to prostate ([place]) 09/08/2013  . 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 NHL (non-Hodgkin's lymphoma) ([place]) 2005   Diffuse large B-cell lymphoma, clinically stage IIIA, CD20 positive, status post cervical lymph node biopsy 08/03/2003 on the left. PET scan was also positive in the spleen and small  bowel region, but bone marrow aspiration and biopsy were negative. So he essentially had stage IIIAs. He received R-CHOP x6 cycles with CR established by PET scan criteria on 11/23/2003 with no evidence for relapse th  . Numbness 02/19/2017   Per [place] new patient packet  . Osteomyelitis ([place]) 06/29/2017   toe of left foot   . PAD (peripheral artery disease) ([place]) 08/27/2017  . Polyneuropathy 02/19/2017  . Postural dizziness with presyncope 02/19/2017  . S/P TURP 08/10/2013  . Sepsis ([place]) 07/02/2018  . Skin cancer    Basal cell- face,head, neck,  arms, legs, Back  . Urinary frequency 02/19/2017   Past Surgical History:  Procedure Laterality Date  . ABDOMINAL AORTOGRAM W/LOWER EXTREMITY N/A 09/20/2019   Procedure: ABDOMINAL AORTOGRAM W/LOWER EXTREMITY;  Surgeon: Marty Heck, MD;  Location: [place] [facility];  Service: Cardiovascular;  Laterality: N/A;  . AMPUTATION Left 06/29/2017   Procedure: AMPUTATION LEFT GREAT TOE;  Surgeon: [name], MD;  Location: [place];  Service: Vascular;  Laterality: Left;  . AMPUTATION Left 08/27/2017   Procedure: AMPUTATION Left SECOND TOE;  Surgeon: [name], MD;  Location: [place];  Service: Vascular;  Laterality: Left;  . CATARACT EXTRACTION Bilateral 2015  . CHOLECYSTECTOMY    . COLON RESECTION     small bowel  . CYSTOSCOPY WITH LITHOLAPAXY N/A 08/10/2013   Procedure: CYSTOSCOPY WITH LITHOLAPAXY WITH [name];  Surgeon: [name], MD;  Location[facility];  Service: Urology;  Laterality: N/A;  . ESOPHAGEAL DILATION N/A 02/27/2016   Procedure: ESOPHAGEAL DILATION;  Surgeon: [name], MD;  [facility];  Service: Endoscopy;  Laterality: N/A;  . ESOPHAGEAL DILATION N/A 07/07/2018   Procedure: ESOPHAGEAL DILATION;  Surgeon: [name], MD;  [facility];  Service: Endoscopy;  Laterality: N/A;  . ESOPHAGOGASTRODUODENOSCOPY N/A 02/27/2016   Procedure: ESOPHAGOGASTRODUODENOSCOPY (EGD);  Surgeon:  [name], MD;  [facility];  Service: Endoscopy;  Laterality: N/A;  12:00  . ESOPHAGOGASTRODUODENOSCOPY N/A 07/07/2018   Procedure: ESOPHAGOGASTRODUODENOSCOPY (EGD);  Surgeon: [name], MD;  [facility];  Service: Endoscopy;  Laterality: N/A;  . INCISION AND DRAINAGE PERIRECTAL ABSCESS    . LOWER EXTREMITY ANGIOGRAPHY N/A 06/25/2017   Procedure: LOWER EXTREMITY ANGIOGRAPHY;  Surgeon: [name], MD;  Location: [place] [facility];  Service: Cardiovascular;  Laterality: N/A;  . LYMPH NODE DISSECTION Left    '05-neck  . PERIPHERAL VASCULAR ATHERECTOMY Right 09/20/2019   Procedure: PERIPHERAL VASCULAR ATHERECTOMY;  Surgeon: Marty Heck, MD;  Location: [place] [facility];  Service: Cardiovascular;  Laterality: Right;  Posterior tibial  . PORT-A-CATH REMOVAL    . PORTACATH PLACEMENT     insertion and removal -last chemotherapy 10 yrs ago  . TRANSURETHRAL RESECTION OF PROSTATE N/A 08/10/2013   Procedure: TRANSURETHRAL RESECTION OF THE PROSTATE WITH GYRUS INSTRUMENTS;  Surgeon: [name], MD;  [facility];  Service: Urology;  Laterality: N/A;     No outpatient  medications have been marked as taking for the 12/15/19 encounter (Appointment) with Arnoldo Lenis, MD.     Allergies:   Tape and Augmentin [amoxicillin-pot clavulanate]   Social History   Tobacco Use  . Smoking status: Never Smoker  . Smokeless tobacco: Never Used  Vaping Use  . Vaping Use: Never used  Substance Use Topics  . Alcohol use: No  . Drug use: No     Family Hx: The patient's family history includes Breast cancer in his sister; Breast cancer (age of onset: 48) in his daughter; Breast cancer (age of onset: 5) in his daughter; Breast cancer (age of onset: 33) in his daughter; Cancer in his sister; Colon cancer in his brother; Heart attack in his mother; Other (age of onset: 55) in his father; Thyroid cancer (age of onset: 40) in his daughter; Thyroid nodules  in his grandchild.  ROS:   Please see the history of present illness.     All other systems reviewed and are negative.   Prior CV studies:   The following studies were reviewed today:  01/2018 echo Study Conclusions  - Left ventricle: The cavity size was normal. Wall thickness was increased in a pattern of mild LVH. Systolic function was normal. The estimated ejection fraction was in the range of 55% to 60%. Wall motion was normal; there were no regional wall motion abnormalities. The study was not technically sufficient to allow evaluation of LV diastolic dysfunction due to atrial fibrillation. - Aortic valve: Moderately calcified annulus. Trileaflet; mildly thickened, mildly calcified leaflets. Morphologically, there was at least mild calcific aortic stenosis if not mild to moderate stenosis. - Mitral valve: There was mild regurgitation. - Inferior vena cava: The vessel was dilated. The respirophasic diameter changes were blunted (<50%), consistent with elevated central venous pressure. Estimated CVP 15 mmHg.  Labs/Other Tests and Data Reviewed:    EKG:  No ECG reviewed.  Recent Labs: 09/23/2019: Magnesium 1.9 11/27/2019: ALT 13; BUN 14; Creatinine, Ser 0.79; Hemoglobin 10.6; Platelets 159; Potassium 4.2; Sodium 135   Recent Lipid Panel Lab Results  Component Value Date/Time   CHOL 147 01/31/2018 12:17 PM   TRIG 109 01/31/2018 12:17 PM   HDL 50 01/31/2018 12:17 PM   CHOLHDL 2.9 01/31/2018 12:17 PM   LDLCALC 75 01/31/2018 12:17 PM    Wt Readings from Last 3 Encounters:  11/29/19 140 lb 11.2 oz (63.8 kg)  11/01/19 140 lb 3.4 oz (63.6 kg)  09/26/19 140 lb (63.5 kg)     Objective:    Vital Signs:   Today's Vitals   12/15/19 0853  BP: (!) 188/90  Pulse: 84  Weight: 145 lb (65.8 kg)  Height: 5\' 8"  (1.727 m)   Body mass index is 22.05 kg/m. Normal affect. Normal speech pattern and tone. Comfortable, no apparent distress. No audible  signs of sob or wheezing.   ASSESSMENT & PLAN:    1. Afib  - no recent palpitations, conitnue current meds  2. HTN  - remains elevated. Careful titration of meds given advanced age and prior orthostatic symptoms - increase dilt to 90mg  bid - bp log in 1 week  COVID-19 Education: The signs and symptoms of COVID-19 were discussed with the patient and how to seek care for testing (follow up with PCP or arrange E-visit).  The importance of social distancing was discussed today.  Time:   Today, I have spent 12 minutes with the patient with telehealth technology discussing the above problems.     Medication  Adjustments/Labs and Tests Ordered: Current medicines are reviewed at length with the patient today.  Concerns regarding medicines are outlined above.   Tests Ordered: No orders of the defined types were placed in this encounter.   Medication Changes: No orders of the defined types were placed in this encounter.   Follow Up:  In Person in 4 month(s)  Signed, Carlyle Dolly, MD  12/15/2019 7:56 AM    [place]

## 2019-12-15 NOTE — Patient Instructions (Signed)
Medication Instructions:  Your physician has recommended you make the following change in your medication:   Increase Diltiazem to 90 mg Two Times Daily   Your physician has requested that you regularly monitor and record your blood pressure readings at home for 1 week. Please use the same machine at the same time of day to check your readings and drop off at office when complete.    *If you need a refill on your cardiac medications before your next appointment, please call your pharmacy*   Lab Work: NONE   If you have labs (blood work) drawn today and your tests are completely normal, you will receive your results only by: Marland Kitchen MyChart Message (if you have MyChart) OR . A paper copy in the mail If you have any lab test that is abnormal or we need to change your treatment, we will call you to review the results.   Testing/Procedures: NONE   Follow-Up: At [facility], you and your health needs are our priority.  As part of our continuing mission to provide you with exceptional heart care, we have created designated Provider Care Teams.  These Care Teams include your primary Cardiologist (physician) and Advanced Practice Providers (APPs -  Physician Assistants and Nurse Practitioners) who all work together to provide you with the care you need, when you need it.  We recommend signing up for the patient portal called "MyChart".  Sign up information is provided on this After Visit Summary.  MyChart is used to connect with patients for Virtual Visits (Telemedicine).  Patients are able to view lab/test results, encounter notes, upcoming appointments, etc.  Non-urgent messages can be sent to your provider as well.   To learn more about what you can do with MyChart, go to [email or web address].    Your next appointment:   4 month(s)  The format for your next appointment:   In Person  Provider:   Carlyle Dolly, MD   Other Instructions Thank you for choosing [facility]!

## 2019-12-18 DIAGNOSIS — C8581 Other specified types of non-Hodgkin lymphoma, lymph nodes of head, face, and neck: Secondary | ICD-10-CM | POA: Diagnosis not present

## 2019-12-18 DIAGNOSIS — E1142 Type 2 diabetes mellitus with diabetic polyneuropathy: Secondary | ICD-10-CM | POA: Diagnosis not present

## 2019-12-18 DIAGNOSIS — L97514 Non-pressure chronic ulcer of other part of right foot with necrosis of bone: Secondary | ICD-10-CM | POA: Diagnosis not present

## 2019-12-18 DIAGNOSIS — C61 Malignant neoplasm of prostate: Secondary | ICD-10-CM | POA: Diagnosis not present

## 2019-12-18 DIAGNOSIS — E1151 Type 2 diabetes mellitus with diabetic peripheral angiopathy without gangrene: Secondary | ICD-10-CM | POA: Diagnosis not present

## 2019-12-18 DIAGNOSIS — E11621 Type 2 diabetes mellitus with foot ulcer: Secondary | ICD-10-CM | POA: Diagnosis not present

## 2019-12-19 ENCOUNTER — Ambulatory Visit: Payer: Self-pay | Admitting: *Deleted

## 2019-12-22 ENCOUNTER — Other Ambulatory Visit (HOSPITAL_COMMUNITY): Payer: Self-pay | Admitting: Nurse Practitioner

## 2019-12-26 ENCOUNTER — Ambulatory Visit: Payer: Medicare Other | Admitting: Vascular Surgery

## 2019-12-26 ENCOUNTER — Other Ambulatory Visit: Payer: Self-pay | Admitting: Urology

## 2019-12-26 ENCOUNTER — Other Ambulatory Visit: Payer: Self-pay

## 2019-12-26 ENCOUNTER — Encounter (HOSPITAL_BASED_OUTPATIENT_CLINIC_OR_DEPARTMENT_OTHER): Payer: Medicare Other | Attending: Internal Medicine | Admitting: Internal Medicine

## 2019-12-26 ENCOUNTER — Encounter: Payer: Self-pay | Admitting: Urology

## 2019-12-26 DIAGNOSIS — E1151 Type 2 diabetes mellitus with diabetic peripheral angiopathy without gangrene: Secondary | ICD-10-CM | POA: Insufficient documentation

## 2019-12-26 DIAGNOSIS — I10 Essential (primary) hypertension: Secondary | ICD-10-CM | POA: Diagnosis not present

## 2019-12-26 DIAGNOSIS — I4891 Unspecified atrial fibrillation: Secondary | ICD-10-CM | POA: Diagnosis not present

## 2019-12-26 DIAGNOSIS — E1142 Type 2 diabetes mellitus with diabetic polyneuropathy: Secondary | ICD-10-CM | POA: Insufficient documentation

## 2019-12-26 DIAGNOSIS — I771 Stricture of artery: Secondary | ICD-10-CM | POA: Insufficient documentation

## 2019-12-26 DIAGNOSIS — Z8572 Personal history of non-Hodgkin lymphomas: Secondary | ICD-10-CM | POA: Diagnosis not present

## 2019-12-26 DIAGNOSIS — Z8546 Personal history of malignant neoplasm of prostate: Secondary | ICD-10-CM | POA: Diagnosis not present

## 2019-12-26 DIAGNOSIS — E11621 Type 2 diabetes mellitus with foot ulcer: Secondary | ICD-10-CM | POA: Insufficient documentation

## 2019-12-26 DIAGNOSIS — C7951 Secondary malignant neoplasm of bone: Secondary | ICD-10-CM | POA: Insufficient documentation

## 2019-12-26 DIAGNOSIS — F0390 Unspecified dementia without behavioral disturbance: Secondary | ICD-10-CM | POA: Insufficient documentation

## 2019-12-26 DIAGNOSIS — Z7901 Long term (current) use of anticoagulants: Secondary | ICD-10-CM | POA: Diagnosis not present

## 2019-12-26 DIAGNOSIS — N3000 Acute cystitis without hematuria: Secondary | ICD-10-CM

## 2019-12-26 DIAGNOSIS — L97412 Non-pressure chronic ulcer of right heel and midfoot with fat layer exposed: Secondary | ICD-10-CM | POA: Diagnosis not present

## 2019-12-26 DIAGNOSIS — L97514 Non-pressure chronic ulcer of other part of right foot with necrosis of bone: Secondary | ICD-10-CM | POA: Diagnosis not present

## 2019-12-26 MED ORDER — TRIMETHOPRIM 100 MG PO TABS: 100.0000 mg | ORAL_TABLET | Freq: Every day | ORAL | 2 refills | Status: DC

## 2019-12-26 MED ORDER — CEPHALEXIN 250 MG PO CAPS: 250.0000 mg | ORAL_CAPSULE | Freq: Every day | ORAL | 11 refills | Status: DC

## 2019-12-26 NOTE — Telephone Encounter (Signed)
I am fine w/ him taking trimethoprim once macrodantin finished--I sent in scrip in case more is needed

## 2019-12-26 NOTE — Telephone Encounter (Signed)
This encounter was created in error - please disregard.

## 2019-12-27 ENCOUNTER — Other Ambulatory Visit (HOSPITAL_COMMUNITY): Payer: Self-pay | Admitting: *Deleted

## 2019-12-27 DIAGNOSIS — C8581 Other specified types of non-Hodgkin lymphoma, lymph nodes of head, face, and neck: Secondary | ICD-10-CM | POA: Diagnosis not present

## 2019-12-27 DIAGNOSIS — E11621 Type 2 diabetes mellitus with foot ulcer: Secondary | ICD-10-CM | POA: Diagnosis not present

## 2019-12-27 DIAGNOSIS — E1142 Type 2 diabetes mellitus with diabetic polyneuropathy: Secondary | ICD-10-CM | POA: Diagnosis not present

## 2019-12-27 DIAGNOSIS — L97514 Non-pressure chronic ulcer of other part of right foot with necrosis of bone: Secondary | ICD-10-CM | POA: Diagnosis not present

## 2019-12-27 DIAGNOSIS — C61 Malignant neoplasm of prostate: Secondary | ICD-10-CM | POA: Diagnosis not present

## 2019-12-27 DIAGNOSIS — E1151 Type 2 diabetes mellitus with diabetic peripheral angiopathy without gangrene: Secondary | ICD-10-CM | POA: Diagnosis not present

## 2019-12-27 MED ORDER — PREDNISONE 2.5 MG PO TABS
2.5000 mg | ORAL_TABLET | Freq: Two times a day (BID) | ORAL | 2 refills | Status: DC
Start: 2019-12-27 — End: 2020-02-03

## 2019-12-27 NOTE — Progress Notes (Signed)
[name], Foster ([id number]) Visit Report for 12/26/2019 HPI Details Patient Name: Date of Service: [name] 12/26/2019 11:00 A M Medical Record Number: [id number] Patient Account Number: [id number] Date of Birth/Sex: Treating RN: 08-Jan-1924 (86 y.o. [name]) [name] Primary Care Provider: Sherrie Foster Other Clinician: Referring Provider: Treating Provider/Extender: [name] in Treatment: 31 History of Present Illness HPI Description: ADMISSION 05/02/2019 This is a 86 year old man accompanied by his daughter. He lives in [place] with his elderly wife. He has home caregivers as well. Apparently on February 28, 2019 they noted blood on his sock and noticed a wound on the tip of his right great toe. He has been cared for by [name] who is his podiatrist in [place]. Apparently they have been using Betadine to this area. The patient has a thick mycotic nail on the right as well which is embedded in the wound in some areas. Fortunately the patient has diabetic neuropathy and is not really experiencing any pain otherwise I think this would all be quite painful. The patient was previously treated in 2019 for wounds on the left foot. This included a fairly thorough vascular review by vein and vascular [name]. His ABIs at that time were noncompressible bilaterally TBI on the right was 0.88 with biphasic waveforms on the right and monophasic waveforms at the left. He ended up having amputations of the left first and second toes. He did have an angiogram on 06/25/2017 which showed on the right the right common femoral profundofemoral for [name] and superficial femoral arteries were widely patent the right popliteal was widely patent. Tibial vessels were not well opacified secondary to motion artifact however he was felt to have all 3 tibial vessels intact with some calcification and some areas of stenosis within the posterior tibial artery  but relatively good flow to the right lower leg. He also had the left reviewed as well which was the source of the problem at that time. Past medical history; prostate cancer on oral antiandrogen therapy with bone mets, GI stromal tumor in 2005, type 2 diabetes with peripheral neuropathy, hypertension, atrial fibrillation on Eliquis, history of non-Hodgkin's lymphoma treated with CHOP in 2005. He had amputations of the left first and second toes secondary to osteomyelitis. Does not appear that he has had an x-ray of the right foot ABI in our clinic on the right was noncompressible [see full arterial discussion above]. 1/19; area on the tip of the right great toe under a very mycotic nail tip. We use silver alginate. 1/26; area of the tip of the right great toe under a very mycotic nail. A lot of this seems to have closed down we have been using silver alginate 2/9; the areas on the tip of the right great toe under a very mycotic nail. A lot of this continues to close down. I do not think there is a subungual problem. I am hopeful that this mycotic nail which is thick and raised will allow full epithelialization of the wound 2/23; this was a difficult area on the tip of his right great toe. Thick mycotic nail over the base of the wound. This appears to have closed down now. The nail itself is thick split with considerable subungual debris. READMISSION 07/25/2019 This is a 86 year old man that we cared for earlier this year in the clinic without a wound on the tip of his right great toe under a very mycotic toenail. He is a type II diabetic.  His ABIs have been noncompressible however he has been felt to have adequate blood flow in the right to heal the wound. We were able to get this wound to close over as I understand things he followed with [name] had some debridement of the nail everything looks fine and apparently the wound bed was washed off by his wife over the weekend and by Monday it was  open. His daughter is able to show me pictures. Unfortunately this now has exposed bone raising the possibility that he has had underlying osteomyelitis all along. He has been using Silvadene cream on this he has been referred back to the clinic by [name]. He had x-rays of the right foot that did not show evidence of osteomyelitis in the right foot. He has not been on antibiotics He also had an x-ray of the left foot that showed findings suspicious for osteomyelitis involving the residual base of the first proximal phalanx and the proximal phalanx of the left second toe as well as the left second metatarsal head however he has no wounds in this area. 4/12; culture from last time of bone in the right great toe was negative. The patient's wife apparently scrubbed this toe vigorously and according to his daughter reopen the wound however unfortunately it is all exposed bone last time. I removed some of this and we use Prisma unfortunately the area does not look much better today. Previous x-rays done in podiatry office [name] did not show evidence of osteomyelitis. Nevertheless I think osteomyelitis here is probably quite likely. Still using Prisma 4/26; he took 2 weeks of doxycycline although his daughter is telling me he is not eating. He has loose bowel movements however I did not get the sense that this is changed all that much. He still has exposed bone. Culture I did last time showed a few coag negative staph I do not think that this is necessarily relevant. I still am operating under the premise that the patient probably has osteomyelitis. After considerable difficulty we are able to determine he is not allergic to cephalosporins I therefore gave him Keflex. 5/10; is tolerating Keflex 500 3 times daily quite well. He still has the small wound in the tip of his toe with exposed bone.Marland Kitchen Unfortunately he does not really have any granulation tissue. He saw [name] of podiatry in  [place] last week I have not had a chance to look at his notes The patient had an extensive vascular work-up including an angiogram in 2019. He was felt to have adequate blood supply on the right at the time although he did have amputations of the left first and second toe by [name]. 09/11/19 -Patient is back and is about to finish his Keflex, appointment with [name] is in the works. The right great toe wound looks about the same, the left third toe dorsal wound has healed. There was a small eschar covering it that was removed by podiatry.According to patient's daughter the right great toe base on the plantar surface had some redness that seems to have disappeared. 10/13/2019; patient is back in clinic today after 1 month hiatus although I have not seen him since 5/10. Since he was last here he was admitted to hospital from 6/2 through 6/5 because of a nonhealing wound on his right great toe. He underwent an angiogram which showed patent common femoral, profunda, SFA above and below-knee popliteal artery. The dominant runoff in the right lower extremity was through the anterior  tibial but that became very small at the level of the ankle and had minimal outflow into the foot. The peroneal artery was patent but atretic. 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 Interventions were attempted on the posterior tibial. Mechanical orbital atherectomy down to the level of the ankle and then this was angioplastied with 2.3 mm and 3 mm balloons. Flow remained very sluggish down the posterior tibial artery but was patent. Post event there apparently was embolization of an area involving the plantar left heel and sides of the left heel. He arrives back in clinic today it has been almost 7 weeks since I have seen him. Miraculously the great toe area with open bone is closed over  probably as result of Dr. Ainsley Spinner interventions. He does however have an extensive ischemic area on the plantar and medial aspect of his heel. I reviewed Dr. Ainsley Spinner last note from 3 days ago on 6/22. He did not think there was enough inflow to heal the heel wound. With eschar and sloughing. Wondered whether he could heal a below-knee amputation but more likely an above the knee amputation when required. He was placed on Xeroform to the right heel In discussing things with the patient's family and the patient present he is not in a lot of pain. The area does not appear to be grossly infected. The patient appears to be comfortable and conversational. He is eating and drinking fairly well. 7/9; the patient saw Dr. Carlis Abbott on 6/29. He had previously done heroic attempt to establish blood flow as described above. 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 Although the eschar on the heel looks ominous both plantar and medial it is certainly no worse than last week. We will continue Betadine to this area with alginate. Ultimately this will separate off and we will see about the viability of the tissue underneath. Also in clinic today our nurses were washing his right great toe tip apparently some of the skin came off eschared and there is an open wound at the tip of the great toe with exposed bone similar to what he had previously 7/23; he has the open area on the tip of his first toe which I think is mostly bone. Then he has the embolic area on the plantar heel extending into the medial heel. We have been using Betadine and silver alginate to the heel silver alginate to the toe. His daughter brings up that she was given nitroglycerin patches by Dr. Donzetta Matters to try and open blood flow to the foot. She has not been using them out of concern of systemic hypotension and also that the labels said not to  put them on extremities 8/10-Returns at 3 weeks, open area on the tip of his first toe that is dried out, the heel with eschar, applying Betadine to the eschar area and silver alginate dressing. 9/7; 1 month follow-up. He has an open area on the tip of the toe almost subungual with a thick mycotic nail. The heel wound has black eschar but this is beginning to separate. We have been using Betadine and silver alginate on the heel and simply silver alginate on the right great toe Electronic Signature(s) Signed: 12/26/2019 5:12:55 PM By: Linton Ham MD Entered By: Linton Ham on 12/26/2019 13:15:53 -------------------------------------------------------------------------------- Physical Exam Details  Patient Name: Date of Service: [name] 12/26/2019 11:00 A M Medical Record Number: [id number] Patient Account Number: [id number] Date of Birth/Sex: Treating RN: Aug 28, 1923 (86 y.o. [name] Primary Care Provider: Sherrie Foster Other Clinician: Referring Provider: Treating Provider/Extender: [name] in Treatment: 69 Constitutional Patient is hypertensive.. Pulse regular and within target range for patient.Marland Kitchen Respirations regular, non-labored and within target range.. Temperature is normal and within the target range for the patient.Marland Kitchen Appears in no distress. Cardiovascular Left foot pedal pulses are absent however his foot is warm. Notes Wound exam Right great toe wound appears to be bent much better than I remember a month ago. There is no exposed bone. I removed some of the overlying thick mycotic nail with a #3 curette. This should allow him better access to the wound for dressings 2. The right heel eschar thought to be initially embolic is separating. No debridement is required however. Electronic Signature(s) Signed: 12/26/2019 5:12:55 PM By: Linton Ham MD Entered By: Linton Ham on 12/26/2019  13:17:21 -------------------------------------------------------------------------------- Physician Orders Details Patient Name: Date of Service: [name] 12/26/2019 11:00 A M Medical Record Number: [id number] Patient Account Number: [id number] Date of Birth/Sex: Treating RN: [birth year] (86 y.o. [name]) [name] Primary Care Provider: Sherrie Foster Other Clinician: Referring Provider: Treating Provider/Extender: [name] in Treatment: 66 Verbal / Phone Orders: No Diagnosis Coding ICD-10 Coding Code Description E11.621 Type 2 diabetes mellitus with foot ulcer L97.514 Non-pressure chronic ulcer of other part of right foot with necrosis of bone E11.40 Type 2 diabetes mellitus with diabetic neuropathy, unspecified Follow-up Appointments Return appointment in 1 month. Dressing Change Frequency Wound #2 Right T Great oe Change dressing every day. - home health to change 3x/week and family to change on other days Wound #3 Right Calcaneus Change dressing every day. - home health to change 3x/week and family to change on other days Wound Cleansing Wound #2 Right T Great oe May shower and wash wound with soap and water. - on days that dressing is changed Wound #3 Right Calcaneus May shower and wash wound with soap and water. - on days that dressing is changed Primary Wound Dressing Wound #2 Right T Great oe Calcium Alginate with Silver Wound #3 Right Calcaneus lginate with Silver - over entire site Calcium A Other: - betadine to eschar sites Secondary Dressing Wound #3 Right Calcaneus Kerlix/Rolled Gauze Dry Gauze Heel [facility] skilled nursing for wound care. - [facility] Electronic Signature(s) Signed: 12/26/2019 5:12:55 PM By: Linton Ham MD Signed: 12/27/2019 5:14:09 PM By: [name] Coria RN Entered By: [name] on 12/26/2019  12:24:01 -------------------------------------------------------------------------------- Problem List Details Patient Name: Date of Service: [name] 12/26/2019 11:00 A M Medical Record Number: [id number] Patient Account Number: [id number] Date of Birth/Sex: Treating RN: 05-Nov-1923 (86 y.o. [name] Primary Care Provider: Sherrie Foster Other Clinician: Referring Provider: Treating Provider/Extender: [name] in Treatment: 19 Active Problems ICD-10 Encounter Code Description Active Date MDM Diagnosis E11.621 Type 2 diabetes mellitus with foot ulcer 07/25/2019 No Yes L97.514 Non-pressure chronic ulcer of other part of right foot with necrosis of bone 07/25/2019 No Yes E11.40 Type 2 diabetes mellitus with diabetic neuropathy, unspecified 07/25/2019 No Yes Inactive Problems Resolved Problems Electronic Signature(s) Signed: 12/26/2019 5:12:55 PM By: Linton Ham MD Entered By: Linton Ham on 12/26/2019 13:14:51 -------------------------------------------------------------------------------- Progress Note Details Patient Name: Date of Service: [place] RTIN, FRA NK E.  12/26/2019 11:00 A M Medical Record Number: [id number] Patient Account Number: [id number] Date of Birth/Sex: Treating RN: [birth year] (86 y.o. [name]) [name] Primary Care Provider: Sherrie Foster Other Clinician: Referring Provider: Treating Provider/Extender: [name] in Treatment: 22 Subjective History of Present Illness (HPI) ADMISSION 05/02/2019 This is a 86 year old man accompanied by his daughter. He lives in [place] with his elderly wife. He has home caregivers as well. Apparently on February 28, 2019 they noted blood on his sock and noticed a wound on the tip of his right great toe. He has been cared for by [name] who is his podiatrist in [place]. Apparently they have been using Betadine to this area. The patient  has a thick mycotic nail on the right as well which is embedded in the wound in some areas. Fortunately the patient has diabetic neuropathy and is not really experiencing any pain otherwise I think this would all be quite painful. The patient was previously treated in 2019 for wounds on the left foot. This included a fairly thorough vascular review by vein and vascular [name]. His ABIs at that time were noncompressible bilaterally TBI on the right was 0.88 with biphasic waveforms on the right and monophasic waveforms at the left. He ended up having amputations of the left first and second toes. He did have an angiogram on 06/25/2017 which showed on the right the right common femoral profundofemoral for [name] and superficial femoral arteries were widely patent the right popliteal was widely patent. Tibial vessels were not well opacified secondary to motion artifact however he was felt to have all 3 tibial vessels intact with some calcification and some areas of stenosis within the posterior tibial artery but relatively good flow to the right lower leg. He also had the left reviewed as well which was the source of the problem at that time. Past medical history; prostate cancer on oral antiandrogen therapy with bone mets, GI stromal tumor in 2005, type 2 diabetes with peripheral neuropathy, hypertension, atrial fibrillation on Eliquis, history of non-Hodgkin's lymphoma treated with CHOP in 2005. He had amputations of the left first and second toes secondary to osteomyelitis. Does not appear that he has had an x-ray of the right foot ABI in our clinic on the right was noncompressible [see full arterial discussion above]. 1/19; area on the tip of the right great toe under a very mycotic nail tip. We use silver alginate. 1/26; area of the tip of the right great toe under a very mycotic nail. A lot of this seems to have closed down we have been using silver alginate 2/9; the areas on the tip of the right  great toe under a very mycotic nail. A lot of this continues to close down. I do not think there is a subungual problem. I am hopeful that this mycotic nail which is thick and raised will allow full epithelialization of the wound 2/23; this was a difficult area on the tip of his right great toe. Thick mycotic nail over the base of the wound. This appears to have closed down now. The nail itself is thick split with considerable subungual debris. READMISSION 07/25/2019 This is a 86 year old man that we cared for earlier this year in the clinic without a wound on the tip of his right great toe under a very mycotic toenail. He is a type II diabetic. His ABIs have been noncompressible however he has been felt to have adequate blood flow in the right to  heal the wound. We were able to get this wound to close over as I understand things he followed with [name] had some debridement of the nail everything looks fine and apparently the wound bed was washed off by his wife over the weekend and by Monday it was open. His daughter is able to show me pictures. Unfortunately this now has exposed bone raising the possibility that he has had underlying osteomyelitis all along. He has been using Silvadene cream on this he has been referred back to the clinic by [name]. He had x-rays of the right foot that did not show evidence of osteomyelitis in the right foot. He has not been on antibiotics He also had an x-ray of the left foot that showed findings suspicious for osteomyelitis involving the residual base of the first proximal phalanx and the proximal phalanx of the left second toe as well as the left second metatarsal head however he has no wounds in this area. 4/12; culture from last time of bone in the right great toe was negative. The patient's wife apparently scrubbed this toe vigorously and according to his daughter reopen the wound however unfortunately it is all exposed bone last time. I removed some  of this and we use Prisma unfortunately the area does not look much better today. Previous x-rays done in podiatry office [name] did not show evidence of osteomyelitis. Nevertheless I think osteomyelitis here is probably quite likely. Still using Prisma 4/26; he took 2 weeks of doxycycline although his daughter is telling me he is not eating. He has loose bowel movements however I did not get the sense that this is changed all that much. He still has exposed bone. Culture I did last time showed a few coag negative staph I do not think that this is necessarily relevant. I still am operating under the premise that the patient probably has osteomyelitis. After considerable difficulty we are able to determine he is not allergic to cephalosporins I therefore gave him Keflex. 5/10; is tolerating Keflex 500 3 times daily quite well. He still has the small wound in the tip of his toe with exposed bone.Marland Kitchen Unfortunately he does not really have any granulation tissue. He saw [name] of podiatry in [place] last week I have not had a chance to look at his notes The patient had an extensive vascular work-up including an angiogram in 2019. He was felt to have adequate blood supply on the right at the time although he did have amputations of the left first and second toe by [name]. 09/11/19 -Patient is back and is about to finish his Keflex, appointment with [name] is in the works. The right great toe wound looks about the same, the left third toe dorsal wound has healed. There was a small eschar covering it that was removed by podiatry.According to patient's daughter the right great toe base on the plantar surface had some redness that seems to have disappeared. 10/13/2019; patient is back in clinic today after 1 month hiatus although I have not seen him since 5/10. Since he was last here he was admitted to hospital from 6/2 through 6/5 because of a nonhealing wound on his right great toe. He  underwent an angiogram which showed patent common femoral, profunda, SFA above and below-knee popliteal artery. The dominant runoff in the right lower extremity was through the anterior tibial but that became very small at the level of the ankle and had minimal outflow into the foot.  The peroneal artery was patent but atretic. 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 Interventions were attempted on the posterior tibial. Mechanical orbital atherectomy down to the level of the ankle and then this was angioplastied with 2.3 mm and 3 mm balloons. Flow remained very sluggish down the posterior tibial artery but was patent. Post event there apparently was embolization of an area involving the plantar left heel and sides of the left heel. He arrives back in clinic today it has been almost 7 weeks since I have seen him. Miraculously the great toe area with open bone is closed over probably as result of Dr. Ainsley Spinner interventions. He does however have an extensive ischemic area on the plantar and medial aspect of his heel. I reviewed Dr. Ainsley Spinner last note from 3 days ago on 6/22. He did not think there was enough inflow to heal the heel wound. With eschar and sloughing. Wondered whether he could heal a below-knee amputation but more likely an above the knee amputation when required. He was placed on Xeroform to the right heel In discussing things with the patient's family and the patient present he is not in a lot of pain. The area does not appear to be grossly infected. The patient appears to be comfortable and conversational. He is eating and drinking fairly well. 7/9; the patient saw Dr. Carlis Abbott on 6/29. He had previously done heroic attempt to establish blood flow as described above. 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 Although the eschar on the heel looks ominous both plantar and medial it is certainly no worse than last week. We will continue Betadine to this area with alginate. Ultimately this will separate off and we will see about the viability of the tissue underneath. Also in clinic today our nurses were washing his right great toe tip apparently some of the skin came off eschared and there is an open wound at the tip of the great toe with exposed bone similar to what he had previously 7/23; he has the open area on the tip of his first toe which I think is mostly bone. Then he has the embolic area on the plantar heel extending into the medial heel. We have been using Betadine and silver alginate to the heel silver alginate to the toe. His daughter brings up that she was given nitroglycerin patches by Dr. Donzetta Matters to try and open blood flow to the foot. She has not been using them out of concern of systemic hypotension and also that the labels said not to put them on extremities 8/10-Returns at 3 weeks, open area on the tip of his first toe that is dried out, the heel with eschar, applying Betadine to the eschar area and silver alginate dressing. 9/7; 1 month follow-up. He has an open area on the tip of the toe almost subungual with a thick mycotic nail. The heel wound has black eschar but this is beginning to separate. We have been using Betadine and silver alginate on the heel and simply silver alginate on the right great toe Objective Constitutional Patient is hypertensive.. Pulse regular and within target range for patient.Marland Kitchen Respirations regular, non-labored and within target range.. Temperature is normal and within the target range for the patient.Marland Kitchen Appears in no distress. Vitals Time Taken: 11:38 AM, Height:  70 in, Source: Stated, Weight: 140 lbs, Source: Stated, BMI: 20.1, Temperature: 97.9 F, Pulse: 76 bpm, Respiratory Rate: 18 breaths/min,  Blood Pressure: 161/83 mmHg, Capillary Blood Glucose: 117 mg/dl. General Notes: glucose per pt report this am Cardiovascular Left foot pedal pulses are absent however his foot is warm. General Notes: Wound exam ooRight great toe wound appears to be bent much better than I remember a month ago. There is no exposed bone. I removed some of the overlying thick mycotic nail with a #3 curette. This should allow him better access to the wound for dressings 2. The right heel eschar thought to be initially embolic is separating. No debridement is required however. Integumentary (Hair, Skin) Wound #2 status is Open. Original cause of wound was Gradually Appeared. The wound is located on the Right T Great. The wound measures 0.4cm length x oe 0.4cm width x 0.1cm depth; 0.126cm^2 area and 0.013cm^3 volume. There is bone and Fat Layer (Subcutaneous Tissue) exposed. There is no tunneling or undermining noted. There is a small amount of serous drainage noted. The wound margin is distinct with the outline attached to the wound base. There is medium (34-66%) pink granulation within the wound bed. There is no necrotic tissue within the wound bed. Wound #3 status is Open. Original cause of wound was Gradually Appeared. The wound is located on the Right Calcaneus. The wound measures 4.7cm length x 6.3cm width x 0.1cm depth; 23.256cm^2 area and 2.326cm^3 volume. There is Fat Layer (Subcutaneous Tissue) exposed. There is no tunneling or undermining noted. There is a none present amount of drainage noted. The wound margin is flat and intact. There is no granulation within the wound bed. There is a large (67-100%) amount of necrotic tissue within the wound bed including Eschar. Assessment Active Problems ICD-10 Type 2 diabetes mellitus with foot ulcer Non-pressure chronic ulcer of other part of right foot with necrosis of bone Type 2 diabetes mellitus with diabetic neuropathy, unspecified Plan Follow-up  Appointments: Return appointment in 1 month. Dressing Change Frequency: Wound #2 Right T Great: oe Change dressing every day. - home health to change 3x/week and family to change on other days Wound #3 Right Calcaneus: Change dressing every day. - home health to change 3x/week and family to change on other days Wound Cleansing: Wound #2 Right T Great: oe May shower and wash wound with soap and water. - on days that dressing is changed Wound #3 Right Calcaneus: May shower and wash wound with soap and water. - on days that dressing is changed Primary Wound Dressing: Wound #2 Right T Great: oe Calcium Alginate with Silver Wound #3 Right Calcaneus: Calcium Alginate with Silver - over entire site Other: - betadine to eschar sites Secondary Dressing: Wound #3 Right Calcaneus: Kerlix/Rolled Gauze Dry Gauze Heel Cup Home Health: [facility] for [facility] care. - [facility] 1. At this point I did not change his dressings. His right great toe wound looks better. I did not feel any exposed bone careful observation 2. The black eschared area of a substantial part of his heel is gradually separating. Soft tissue did not show any evidence of infection. This did not require debridement. I explained that at some point we will be able to remove this to determine the status of the underlying subcutaneous tissue however that was certainly not today Electronic Signature(s) Signed: 12/26/2019 5:12:55 PM By: Linton Ham MD Entered By: Linton Ham on 12/26/2019 13:19:22 -------------------------------------------------------------------------------- SuperBill Details Patient Name: Date of Service: MA  [name] 12/26/2019 Medical Record Number: [id number] Patient Account Number: [id number] Date of Birth/Sex: Treating RN: Nov 10, 1923 (86 y.o. [name]) [name] Primary Care Provider: Sherrie Foster Other Clinician: Referring Provider: Treating Provider/Extender: [name] in Treatment: 22 Diagnosis Coding ICD-10 Codes Code Description E11.621 Type 2 diabetes mellitus with foot ulcer L97.514 Non-pressure chronic ulcer of other part of right foot with necrosis of bone E11.40 Type 2 diabetes mellitus with diabetic neuropathy, unspecified Facility Procedures Physician Procedures : CPT4 Code Description Modifier [id number] - WC PHYS LEVEL 3 - EST PT ICD-10 Diagnosis Description E11.621 Type 2 diabetes mellitus with foot ulcer L97.514 Non-pressure chronic ulcer of other part of right foot with necrosis of bone Quantity: 1 Electronic Signature(s) Signed: 12/26/2019 5:12:55 PM By: Linton Ham MD Entered By: Linton Ham on 12/26/2019 13:19:40

## 2019-12-27 NOTE — Progress Notes (Signed)
[name], [name] ([id number]) Visit Report for 12/26/2019 Arrival Information Details Patient Name: Date of Service: [name] [place] E. 12/26/2019 11:00 A M Medical Record Number: [id number] Patient Account Number: [id number] Date of Birth/Sex: Treating RN: 28-Dec-1923 (86 y.o. [name] Primary Care Provider: Sherrie Mustache Other Clinician: Referring Provider: Treating Provider/Extender: Deloria Lair in Treatment: 35 Visit Information History Since Last Visit Added or deleted any medications: Yes Patient Arrived: Wheel Chair Any new allergies or adverse reactions: No Arrival Time: 11:32 Had a fall or experienced change in No Accompanied By: dgt/caregiver activities of daily living that may affect Transfer Assistance: EasyPivot Patient Lift risk of falls: Patient Identification Verified: Yes Signs or symptoms of abuse/neglect since last visito No Secondary Verification Process Completed: Yes Hospitalized since last visit: No Patient Requires Transmission-Based Precautions: No Implantable device outside of the clinic excluding No Patient Has Alerts: Yes cellular tissue based products placed in the center Patient Alerts: R ABI non compressible since last visit: Has Dressing in Place as Prescribed: Yes Has Footwear/Offloading in Place as Prescribed: Yes Right: Other:heel offloading sandal Pain Present Now: No Electronic Signature(s) Signed: 12/26/2019 5:29:26 PM By: Baruch Gouty RN, BSN Entered By: Baruch Gouty on 12/26/2019 11:35:19 -------------------------------------------------------------------------------- Clinic Level of Care Assessment Details Patient Name: Date of Service: [name] 12/26/2019 11:00 A M Medical Record Number: [id number] Patient Account Number: [id number] Date of Birth/Sex: Treating RN: Apr 15, 1924 (86 y.o. [name]) Carlene Coria Primary Care Provider: Sherrie Mustache Other Clinician: Referring Provider: Treating  Provider/Extender: Deloria Lair in Treatment: 22 Clinic Level of Care Assessment Items TOOL 4 Quantity Score X- 1 0 Use when only an EandM is performed on FOLLOW-UP visit ASSESSMENTS - Nursing Assessment / Reassessment X- 1 10 Reassessment of Co-morbidities (includes updates in patient status) X- 1 5 Reassessment of Adherence to Treatment Plan ASSESSMENTS - Wound and Skin A ssessment / Reassessment X - Simple Wound Assessment / Reassessment - one wound 1 5 []  - 0 Complex Wound Assessment / Reassessment - multiple wounds []  - 0 Dermatologic / Skin Assessment (not related to wound area) ASSESSMENTS - Focused Assessment []  - 0 Circumferential Edema Measurements - multi extremities []  - 0 Nutritional Assessment / Counseling / Intervention []  - 0 Lower Extremity Assessment (monofilament, tuning fork, pulses) []  - 0 Peripheral Arterial Disease Assessment (using hand held doppler) ASSESSMENTS - Ostomy and/or Continence Assessment and Care []  - 0 Incontinence Assessment and Management []  - 0 Ostomy Care Assessment and Management (repouching, etc.) PROCESS - Coordination of Care X - Simple Patient / Family Education for ongoing care 1 15 []  - 0 Complex (extensive) Patient / Family Education for ongoing care X- 1 10 Staff obtains Programmer, systems, Records, T Results / Process Orders est []  - 0 Staff telephones HHA, Nursing Homes / Clarify orders / etc []  - 0 Routine Transfer to another Facility (non-emergent condition) []  - 0 Routine Hospital Admission (non-emergent condition) []  - 0 New Admissions / Biomedical engineer / Ordering NPWT Apligraf, etc. , []  - 0 Emergency Hospital Admission (emergent condition) X- 1 10 Simple Discharge Coordination []  - 0 Complex (extensive) Discharge Coordination PROCESS - Special Needs []  - 0 Pediatric / Minor Patient Management []  - 0 Isolation Patient Management []  - 0 Hearing / Language / Visual special  needs []  - 0 Assessment of Community assistance (transportation, D/C planning, etc.) []  - 0 Additional assistance / Altered mentation []  - 0 Support Surface(s) Assessment (bed, cushion, seat, etc.) INTERVENTIONS -  Wound Cleansing / Measurement []  - 0 Simple Wound Cleansing - one wound X- 2 5 Complex Wound Cleansing - multiple wounds X- 1 5 Wound Imaging (photographs - any number of wounds) []  - 0 Wound Tracing (instead of photographs) []  - 0 Simple Wound Measurement - one wound X- 2 5 Complex Wound Measurement - multiple wounds INTERVENTIONS - Wound Dressings []  - 0 Small Wound Dressing one or multiple wounds X- 2 15 Medium Wound Dressing one or multiple wounds []  - 0 Large Wound Dressing one or multiple wounds X- 1 5 Application of Medications - topical []  - 0 Application of Medications - injection INTERVENTIONS - Miscellaneous []  - 0 External ear exam []  - 0 Specimen Collection (cultures, biopsies, blood, body fluids, etc.) []  - 0 Specimen(s) / Culture(s) sent or taken to Lab for analysis []  - 0 Patient Transfer (multiple staff / Civil Service fast streamer / Similar devices) []  - 0 Simple Staple / Suture removal (25 or less) []  - 0 Complex Staple / Suture removal (26 or more) []  - 0 Hypo / Hyperglycemic Management (close monitor of Blood Glucose) []  - 0 Ankle / Brachial Index (ABI) - do not check if billed separately X- 1 5 Vital Signs Has the patient been seen at the hospital within the last three years: Yes Total Score: 120 Level Of Care: New/Established - Level 4 Electronic Signature(s) Signed: 12/27/2019 5:14:09 PM By: Carlene Coria RN Entered By: Carlene Coria on 12/26/2019 12:16:12 -------------------------------------------------------------------------------- Encounter Discharge Information Details Patient Name: Date of Service: [name] 12/26/2019 11:00 A M Medical Record Number: [id number] Patient Account Number: [id number] Date of Birth/Sex: Treating  RN: 1923-11-15 (86 y.o. [name] Primary Care Provider: Sherrie Mustache Other Clinician: Referring Provider: Treating Provider/Extender: Deloria Lair in Treatment: 39 Encounter Discharge Information Items Discharge Condition: Stable Ambulatory Status: Wheelchair Discharge Destination: Home Transportation: Private Auto Accompanied By: daughter and cg Schedule Follow-up Appointment: Yes Clinical Summary of Care: Patient Declined Electronic Signature(s) Signed: 12/26/2019 5:32:29 PM By: [name] Hurst RN, BSN Entered By: [name] on 12/26/2019 12:29:25 -------------------------------------------------------------------------------- Lower Extremity Assessment Details Patient Name: Date of Service: [name] 12/26/2019 11:00 A M Medical Record Number: [id number] Patient Account Number: [id number] Date of Birth/Sex: Treating RN: [birth year] (86 y.o. [name] Primary Care Provider: Sherrie Mustache Other Clinician: Referring Provider: Treating Provider/Extender: Deloria Lair in Treatment: 22 Edema Assessment Assessed: [name]: No] [name]: No] Edema: [Left: N] [Right: o] Calf Left: Right: Point of Measurement: cm From Medial Instep cm 29.5 cm Ankle Left: Right: Point of Measurement: cm From Medial Instep cm 23.5 cm Vascular Assessment Pulses: Dorsalis Pedis Palpable: [Right:Yes] Electronic Signature(s) Signed: 12/26/2019 5:29:26 PM By: Baruch Gouty RN, BSN Entered By: Baruch Gouty on 12/26/2019 11:47:18 -------------------------------------------------------------------------------- Multi Wound Chart Details Patient Name: Date of Service: [name] 12/26/2019 11:00 A M Medical Record Number: [id number] Patient Account Number: [id number] Date of Birth/Sex: Treating RN: 05-17-1923 (86 y.o. [name] Primary Care Provider: Sherrie Mustache Other Clinician: Referring  Provider: Treating Provider/Extender: Deloria Lair in Treatment: 22 Vital Signs Height(in): 70 Capillary Blood Glucose(mg/dl): 117 Weight(lbs): 140 Pulse(bpm): 43 Body Mass Index(BMI): 20 Blood Pressure(mmHg): 161/83 Temperature(F): 97.9 Respiratory Rate(breaths/min): 18 Photos: [2:No Photos Right T Great oe] [3:No Photos Right Calcaneus] [N/A:N/A N/A] Wound Location: [2:Gradually Appeared] [3:Gradually Appeared] [N/A:N/A] Wounding Event: [2:Diabetic Wound/Ulcer of the Lower] [3:Pressure Ulcer] [N/A:N/A] Primary Etiology: [2:Extremity N/A] [3:Arterial Insufficiency Ulcer] [N/A:N/A] Secondary Etiology: [2:Cataracts,  Arrhythmia, Hypertension, Cataracts, Arrhythmia, Hypertension, N/A] Comorbid History: [2:Peripheral Arterial Disease, Type II Peripheral Arterial Disease, Type II Diabetes, Osteomyelitis, Dementia, Diabetes, Osteomyelitis, Dementia, Neuropathy, Received Chemotherapy Neuropathy, Received Chemotherapy 06/15/2019]  [3:09/21/2019] [N/A:N/A] Date Acquired: [2:22] [3:10] [N/A:N/A] Weeks of Treatment: [2:Open] [3:Open] [N/A:N/A] Wound Status: [2:0.4x0.4x0.1] [3:4.7x6.3x0.1] [N/A:N/A] Measurements L x W x D (cm) [2:0.126] [3:23.256] [N/A:N/A] A (cm) : rea [2:0.013] [3:2.326] [N/A:N/A] Volume (cm) : [2:-34.00%] [3:45.80%] [N/A:N/A] % Reduction in A rea: [2:53.60%] [3:45.80%] [N/A:N/A] % Reduction in Volume: [2:Grade 2] [3:Unstageable/Unclassified] [N/A:N/A] Classification: [2:Small] [3:None Present] [N/A:N/A] Exudate A mount: [2:Serous] [3:N/A] [N/A:N/A] Exudate Type: [2:amber] [3:N/A] [N/A:N/A] Exudate Color: [2:Distinct, outline attached] [3:Flat and Intact] [N/A:N/A] Wound Margin: [2:Medium (34-66%)] [3:None Present (0%)] [N/A:N/A] Granulation A mount: [2:Pink] [3:N/A] [N/A:N/A] Granulation Quality: [2:None Present (0%)] [3:Large (67-100%)] [N/A:N/A] Necrotic A mount: [2:N/A] [3:Eschar] [N/A:N/A] Necrotic Tissue: [2:Fat Layer (Subcutaneous  Tissue): Yes Fat Layer (Subcutaneous Tissue): Yes N/A] Exposed Structures: [2:Bone: Yes Fascia: No Tendon: No Muscle: No Joint: No Small (1-33%)] [3:Fascia: No Tendon: No Muscle: No Joint: No Bone: No Small (1-33%)] [N/A:N/A] Treatment Notes Wound #2 (Right Toe Great) 1. Cleanse With Wound Cleanser 3. Primary Dressing Applied Calcium Alginate Ag Other primary dressing (specifiy in notes) 4. Secondary Dressing Dry Gauze Roll Gauze Heel Cup 5. Secured With Tape Notes betadine to eschar on heel Wound #3 (Right Calcaneus) 1. Cleanse With Wound Cleanser 3. Primary Dressing Applied Calcium Alginate Ag Other primary dressing (specifiy in notes) 4. Secondary Dressing Dry Gauze Roll Gauze Heel Cup 5. Secured With Tape Notes betadine to eschar on heel Electronic Signature(s) Signed: 12/26/2019 5:12:55 PM By: Linton Ham MD Signed: 12/27/2019 5:14:09 PM By: Carlene Coria RN Entered By: Linton Ham on 12/26/2019 13:14:58 -------------------------------------------------------------------------------- [place] Details Patient Name: Date of Service: [name] NK E. 12/26/2019 11:00 A M Medical Record Number: [id number] Patient Account Number: [id number] Date of Birth/Sex: Treating RN: [birth year] (86 y.o. [name] Primary Care Provider: Sherrie Mustache Other Clinician: Referring Provider: Treating Provider/Extender: Deloria Lair in Treatment: 22 Active Inactive Wound/Skin Impairment Nursing Diagnoses: Knowledge deficit related to ulceration/compromised skin integrity Goals: Patient/caregiver will verbalize understanding of skin care regimen Date Initiated: 07/25/2019 Target Resolution Date: 01/08/2020 Goal Status: Active Ulcer/skin breakdown will have a volume reduction of 30% by week 4 Date Initiated: 07/25/2019 Date Inactivated: 08/28/2019 Target Resolution Date: 08/25/2019 Goal Status: Unmet Unmet Reason:  Osteomyelitis Interventions: Assess patient/caregiver ability to obtain necessary supplies Assess patient/caregiver ability to perform ulcer/skin care regimen upon admission and as needed Assess ulceration(s) every visit Notes: Electronic Signature(s) Signed: 12/27/2019 5:14:09 PM By: Carlene Coria RN Entered By: Carlene Coria on 12/26/2019 11:36:57 -------------------------------------------------------------------------------- Pain Assessment Details Patient Name: Date of Service: [name] E. 12/26/2019 11:00 A M Medical Record Number: [id number] Patient Account Number: [id number] Date of Birth/Sex: Treating RN: 1923-12-07 (86 y.o. [name] Primary Care Provider: Sherrie Mustache Other Clinician: Referring Provider: Treating Provider/Extender: Deloria Lair in Treatment: 22 Active Problems Location of Pain Severity and Description of Pain Patient Has Paino No Site Locations Rate the pain. Current Pain Level: 0 Pain Management and Medication Current Pain Management: Electronic Signature(s) Signed: 12/26/2019 5:29:26 PM By: Baruch Gouty RN, BSN Entered By: Baruch Gouty on 12/26/2019 11:39:02 -------------------------------------------------------------------------------- Patient/Caregiver Education Details Patient Name: Date of Service: MA RTIN, FRA NK E. 9/7/2021andnbsp11:00 [place] Record Number: [id number] Patient Account Number: [id number] Date of Birth/Gender: Treating RN: 04/07/1924 (86 y.o. [name]) Carlene Coria Primary Care Physician: Sherrie Mustache Other  Clinician: Referring Physician: Treating Physician/Extender: Deloria Lair in Treatment: 22 Education Assessment Education Provided To: Patient Education Topics Provided Wound/Skin Impairment: Methods: Explain/Verbal Responses: State content correctly [employer]) Signed: 12/27/2019 5:14:09 PM By: Carlene Coria RN Entered By: Carlene Coria on 12/26/2019 11:37:14 -------------------------------------------------------------------------------- Wound Assessment Details Patient Name: Date of Service: [name] E. 12/26/2019 11:00 A M Medical Record Number: [id number] Patient Account Number: [id number] Date of Birth/Sex: Treating RN: 1924/03/30 (86 y.o. [name] Primary Care Provider: Sherrie Mustache Other Clinician: Referring Provider: Treating Provider/Extender: Deloria Lair in Treatment: 22 Wound Status Wound Number: 2 Primary Diabetic Wound/Ulcer of the Lower Extremity Etiology: Wound Location: Right T Great oe Wound Open Wounding Event: Gradually Appeared Status: Date Acquired: 06/15/2019 Comorbid Cataracts, Arrhythmia, Hypertension, Peripheral Arterial Disease, Weeks Of Treatment: 22 History: Type II Diabetes, Osteomyelitis, Dementia, Neuropathy, Received Clustered Wound: No Chemotherapy Photos Photo Uploaded By: [name] on 12/27/2019 13:49:50 Wound Measurements Length: (cm) 0.4 Width: (cm) 0.4 Depth: (cm) 0.1 Area: (cm) 0.126 Volume: (cm) 0.013 % Reduction in Area: -34% % Reduction in Volume: 53.6% Epithelialization: Small (1-33%) Tunneling: No Undermining: No Wound Description Classification: Grade 2 Wound Margin: Distinct, outline attached Exudate Amount: Small Exudate Type: Serous Exudate Color: amber Foul Odor After Cleansing: No Slough/Fibrino No Wound Bed Granulation Amount: Medium (34-66%) Exposed Structure Granulation Quality: Pink Fascia Exposed: No Necrotic Amount: None Present (0%) Fat Layer (Subcutaneous Tissue) Exposed: Yes Tendon Exposed: No Muscle Exposed: No Joint Exposed: No Bone Exposed: Yes Treatment Notes Wound #2 (Right Toe Great) 1. Cleanse With Wound Cleanser 3. Primary Dressing Applied Calcium Alginate Ag Other primary dressing (specifiy in notes) 4. Secondary Dressing Dry Gauze Roll Gauze Heel Cup 5.  Secured With Tape Notes betadine to eschar on heel Electronic Signature(s) Signed: 12/26/2019 5:29:26 PM By: Baruch Gouty RN, BSN Entered By: Baruch Gouty on 12/26/2019 11:49:49 -------------------------------------------------------------------------------- Wound Assessment Details Patient Name: Date of Service: [name] 12/26/2019 11:00 A M Medical Record Number: [id number] Patient Account Number: [id number] Date of Birth/Sex: Treating RN: 10/25/1923 (86 y.o. [name] Primary Care Provider: Sherrie Mustache Other Clinician: Referring Provider: Treating Provider/Extender: Deloria Lair in Treatment: 22 Wound Status Wound Number: 3 Primary Pressure Ulcer Etiology: Wound Location: Right Calcaneus Secondary Arterial Insufficiency Ulcer Wounding Event: Gradually Appeared Etiology: Date Acquired: 09/21/2019 Wound Open Weeks Of Treatment: 10 Status: Clustered Wound: No Comorbid Cataracts, Arrhythmia, Hypertension, Peripheral Arterial Disease, History: Type II Diabetes, Osteomyelitis, Dementia, Neuropathy, Received Chemotherapy Photos Photo Uploaded By: [name] on 12/27/2019 13:49:26 Wound Measurements Length: (cm) 4.7 Width: (cm) 6.3 Depth: (cm) 0.1 Area: (cm) 23.256 Volume: (cm) 2.326 % Reduction in Area: 45.8% % Reduction in Volume: 45.8% Epithelialization: Small (1-33%) Tunneling: No Undermining: No Wound Description Classification: Unstageable/Unclassified Wound Margin: Flat and Intact Exudate Amount: None Present Foul Odor After Cleansing: No Slough/Fibrino No Wound Bed Granulation Amount: None Present (0%) Exposed Structure Necrotic Amount: Large (67-100%) Fascia Exposed: No Necrotic Quality: Eschar Fat Layer (Subcutaneous Tissue) Exposed: Yes Tendon Exposed: No Muscle Exposed: No Joint Exposed: No Bone Exposed: No Treatment Notes Wound #3 (Right Calcaneus) 1. Cleanse With Wound Cleanser 3.  Primary Dressing Applied Calcium Alginate Ag Other primary dressing (specifiy in notes) 4. Secondary Dressing Dry Gauze Roll Gauze Heel Cup 5. Secured With Tape Notes betadine to eschar on heel Electronic Signature(s) Signed: 12/26/2019 5:29:26 PM By: Baruch Gouty RN, BSN Entered By: Baruch Gouty on 12/26/2019 11:50:17 -------------------------------------------------------------------------------- Vitals Details Patient Name: Date  of Service: [name] NK E. 12/26/2019 11:00 A M Medical Record Number: [id number] Patient Account Number: [id number] Date of Birth/Sex: Treating RN: November 14, 1923 (86 y.o. [name] Primary Care Provider: Sherrie Mustache Other Clinician: Referring Provider: Treating Provider/Extender: Deloria Lair in Treatment: 22 Vital Signs Time Taken: 11:38 Temperature (F): 97.9 Height (in): 70 Pulse (bpm): 76 Source: Stated Respiratory Rate (breaths/min): 18 Weight (lbs): 140 Blood Pressure (mmHg): 161/83 Source: Stated Capillary Blood Glucose (mg/dl): 117 Body Mass Index (BMI): 20.1 Reference Range: 80 - 120 mg / dl Notes glucose per pt report this am Electronic Signature(s) Signed: 12/26/2019 5:29:26 PM By: Baruch Gouty RN, BSN Entered By: Baruch Gouty on 12/26/2019 11:38:48

## 2019-12-28 ENCOUNTER — Inpatient Hospital Stay (HOSPITAL_COMMUNITY): Payer: Medicare Other

## 2019-12-28 ENCOUNTER — Encounter (HOSPITAL_COMMUNITY): Payer: Self-pay

## 2019-12-28 ENCOUNTER — Inpatient Hospital Stay (HOSPITAL_COMMUNITY): Payer: Medicare Other | Attending: Hematology

## 2019-12-28 ENCOUNTER — Other Ambulatory Visit: Payer: Self-pay

## 2019-12-28 VITALS — BP 141/86 | HR 62 | Temp 97.9°F | Resp 18

## 2019-12-28 DIAGNOSIS — C7951 Secondary malignant neoplasm of bone: Secondary | ICD-10-CM | POA: Insufficient documentation

## 2019-12-28 DIAGNOSIS — C61 Malignant neoplasm of prostate: Secondary | ICD-10-CM | POA: Diagnosis not present

## 2019-12-28 LAB — CBC WITH DIFFERENTIAL/PLATELET
Abs Immature Granulocytes: 0.17 10*3/uL — ABNORMAL HIGH (ref 0.00–0.07)
Basophils Absolute: 0 10*3/uL (ref 0.0–0.1)
Basophils Relative: 0 %
Eosinophils Absolute: 0.2 10*3/uL (ref 0.0–0.5)
Eosinophils Relative: 2 %
HCT: 35 % — ABNORMAL LOW (ref 39.0–52.0)
Hemoglobin: 11.4 g/dL — ABNORMAL LOW (ref 13.0–17.0)
Immature Granulocytes: 2 %
Lymphocytes Relative: 20 %
Lymphs Abs: 1.6 10*3/uL (ref 0.7–4.0)
MCH: 29.7 pg (ref 26.0–34.0)
MCHC: 32.6 g/dL (ref 30.0–36.0)
MCV: 91.1 fL (ref 80.0–100.0)
Monocytes Absolute: 1.4 10*3/uL — ABNORMAL HIGH (ref 0.1–1.0)
Monocytes Relative: 17 %
Neutro Abs: 4.8 10*3/uL (ref 1.7–7.7)
Neutrophils Relative %: 59 %
Platelets: 178 10*3/uL (ref 150–400)
RBC: 3.84 MIL/uL — ABNORMAL LOW (ref 4.22–5.81)
RDW: 14.5 % (ref 11.5–15.5)
WBC: 8.1 10*3/uL (ref 4.0–10.5)
nRBC: 0 % (ref 0.0–0.2)

## 2019-12-28 LAB — COMPREHENSIVE METABOLIC PANEL
ALT: 13 U/L (ref 0–44)
AST: 13 U/L — ABNORMAL LOW (ref 15–41)
Albumin: 3.6 g/dL (ref 3.5–5.0)
Alkaline Phosphatase: 60 U/L (ref 38–126)
Anion gap: 10 (ref 5–15)
BUN: 25 mg/dL — ABNORMAL HIGH (ref 8–23)
CO2: 27 mmol/L (ref 22–32)
Calcium: 10.6 mg/dL — ABNORMAL HIGH (ref 8.9–10.3)
Chloride: 101 mmol/L (ref 98–111)
Creatinine, Ser: 0.82 mg/dL (ref 0.61–1.24)
GFR calc Af Amer: >60 mL/min (ref >60)
GFR calc non Af Amer: >60 mL/min (ref >60)
Glucose, Bld: 184 mg/dL — ABNORMAL HIGH (ref 70–99)
Potassium: 4.4 mmol/L (ref 3.5–5.1)
Sodium: 138 mmol/L (ref 135–145)
Total Bilirubin: 0.5 mg/dL (ref 0.3–1.2)
Total Protein: 6.6 g/dL (ref 6.5–8.1)

## 2019-12-28 LAB — PSA: Prostatic Specific Antigen: <0.01 ng/mL (ref 0.00–4.00)

## 2019-12-28 LAB — LACTATE DEHYDROGENASE: LDH: 95 U/L — ABNORMAL LOW (ref 98–192)

## 2019-12-28 MED ORDER — DENOSUMAB 120 MG/1.7ML ~~LOC~~ SOLN
120.0000 mg | Freq: Once | SUBCUTANEOUS | Status: AC
  Administered 2019-12-28: 120 mg via SUBCUTANEOUS

## 2019-12-28 NOTE — Patient Instructions (Signed)
[facility] at [facility] Discharge Instructions  Received Xgeva injection today. Follow-up as scheduled   Thank you for choosing [facility] at [facility] to provide your oncology and hematology care.  To afford each patient quality time with our provider, please arrive at least 15 minutes before your scheduled appointment time.   If you have a lab appointment with the Cancer Center please come in thru the Main Entrance and check in at the main information desk.  You need to re-schedule your appointment should you arrive 10 or more minutes late.  We strive to give you quality time with our providers, and arriving late affects you and other patients whose appointments are after yours.  Also, if you no show three or more times for appointments you may be dismissed from the clinic at the providers discretion.     Again, thank you for choosing [facility].  Our hope is that these requests will decrease the amount of time that you wait before being seen by our physicians.       _____________________________________________________________  Should you have questions after your visit to [facility], please contact our office at [phone] and follow the prompts.  Our office hours are 8:00 a.m. and 4:30 p.m. Monday - Friday.  Please note that voicemails left after 4:00 p.m. may not be returned until the following business day.  We are closed weekends and major holidays.  You do have access to a nurse 24-7, just call the main number to the clinic [phone] and do not press any options, hold on the line and a nurse will answer the phone.    For prescription refill requests, have your pharmacy contact our office and allow 72 hours.    Due to Covid, you will need to wear a mask upon entering the hospital. If you do not have a mask, a mask will be given to you at the Main Entrance upon arrival. For doctor visits, patients may have 1  support person age 18 or older with them. For treatment visits, patients can not have anyone with them due to social distancing guidelines and our immunocompromised population.     

## 2019-12-28 NOTE — Progress Notes (Signed)
[place] reviewed with RLockamy NP and pt approved for Xgeva injection today with orders obtained to hold Calcium PO for 3 days then restart at prescribed dose per NP. This information was reviewed with his caregiver who verbalized understanding.                                                                                 [name] tolerated Xgeva injection well without complaints or incident. Calcium 10.6 today and pt denied any tooth or jaw pain and no recent or future dental visits prior to administering this medication. VSS Pt discharged via wheelchair in satisfactory condition

## 2020-01-04 DIAGNOSIS — E1151 Type 2 diabetes mellitus with diabetic peripheral angiopathy without gangrene: Secondary | ICD-10-CM | POA: Diagnosis not present

## 2020-01-04 DIAGNOSIS — L97514 Non-pressure chronic ulcer of other part of right foot with necrosis of bone: Secondary | ICD-10-CM | POA: Diagnosis not present

## 2020-01-04 DIAGNOSIS — E1142 Type 2 diabetes mellitus with diabetic polyneuropathy: Secondary | ICD-10-CM | POA: Diagnosis not present

## 2020-01-04 DIAGNOSIS — C8581 Other specified types of non-Hodgkin lymphoma, lymph nodes of head, face, and neck: Secondary | ICD-10-CM | POA: Diagnosis not present

## 2020-01-04 DIAGNOSIS — E11621 Type 2 diabetes mellitus with foot ulcer: Secondary | ICD-10-CM | POA: Diagnosis not present

## 2020-01-04 DIAGNOSIS — C61 Malignant neoplasm of prostate: Secondary | ICD-10-CM | POA: Diagnosis not present

## 2020-01-08 ENCOUNTER — Encounter: Payer: Self-pay | Admitting: Nurse Practitioner

## 2020-01-08 ENCOUNTER — Telehealth: Payer: Self-pay | Admitting: *Deleted

## 2020-01-08 NOTE — Telephone Encounter (Signed)
Agree to watch, he may have some GI symptoms but likely the acid in the stomach will take care of it.

## 2020-01-08 NOTE — Telephone Encounter (Signed)
Daughter, Tye [place] called and stated that patient ate some Jello that had Bacterial Colonies in it. 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 Stated that it was Coral shiny and Glistening. Stated that it had mold on it "Fuzzy".  Daughter is wondering if patient should be placed on Antibiotics for preventive.   I told her to watch patient for any symptoms. She agreed.   Please Advise.

## 2020-01-09 NOTE — Telephone Encounter (Signed)
[name] called and spoke with daughter yesterday.

## 2020-01-10 DIAGNOSIS — L97514 Non-pressure chronic ulcer of other part of right foot with necrosis of bone: Secondary | ICD-10-CM | POA: Diagnosis not present

## 2020-01-10 DIAGNOSIS — E1151 Type 2 diabetes mellitus with diabetic peripheral angiopathy without gangrene: Secondary | ICD-10-CM | POA: Diagnosis not present

## 2020-01-10 DIAGNOSIS — E1142 Type 2 diabetes mellitus with diabetic polyneuropathy: Secondary | ICD-10-CM | POA: Diagnosis not present

## 2020-01-10 DIAGNOSIS — C61 Malignant neoplasm of prostate: Secondary | ICD-10-CM | POA: Diagnosis not present

## 2020-01-10 DIAGNOSIS — E11621 Type 2 diabetes mellitus with foot ulcer: Secondary | ICD-10-CM | POA: Diagnosis not present

## 2020-01-10 DIAGNOSIS — C8581 Other specified types of non-Hodgkin lymphoma, lymph nodes of head, face, and neck: Secondary | ICD-10-CM | POA: Diagnosis not present

## 2020-01-11 ENCOUNTER — Encounter (HOSPITAL_BASED_OUTPATIENT_CLINIC_OR_DEPARTMENT_OTHER): Payer: Medicare Other | Admitting: Internal Medicine

## 2020-01-11 ENCOUNTER — Other Ambulatory Visit: Payer: Self-pay

## 2020-01-11 DIAGNOSIS — E1142 Type 2 diabetes mellitus with diabetic polyneuropathy: Secondary | ICD-10-CM | POA: Diagnosis not present

## 2020-01-11 DIAGNOSIS — E11621 Type 2 diabetes mellitus with foot ulcer: Secondary | ICD-10-CM | POA: Diagnosis not present

## 2020-01-11 DIAGNOSIS — C7951 Secondary malignant neoplasm of bone: Secondary | ICD-10-CM | POA: Diagnosis not present

## 2020-01-11 DIAGNOSIS — L97514 Non-pressure chronic ulcer of other part of right foot with necrosis of bone: Secondary | ICD-10-CM | POA: Diagnosis not present

## 2020-01-11 DIAGNOSIS — L97419 Non-pressure chronic ulcer of right heel and midfoot with unspecified severity: Secondary | ICD-10-CM | POA: Diagnosis not present

## 2020-01-11 DIAGNOSIS — L97512 Non-pressure chronic ulcer of other part of right foot with fat layer exposed: Secondary | ICD-10-CM | POA: Diagnosis not present

## 2020-01-11 DIAGNOSIS — Z8546 Personal history of malignant neoplasm of prostate: Secondary | ICD-10-CM | POA: Diagnosis not present

## 2020-01-11 DIAGNOSIS — E1151 Type 2 diabetes mellitus with diabetic peripheral angiopathy without gangrene: Secondary | ICD-10-CM | POA: Diagnosis not present

## 2020-01-13 DIAGNOSIS — C8581 Other specified types of non-Hodgkin lymphoma, lymph nodes of head, face, and neck: Secondary | ICD-10-CM | POA: Diagnosis not present

## 2020-01-13 DIAGNOSIS — I4821 Permanent atrial fibrillation: Secondary | ICD-10-CM | POA: Diagnosis not present

## 2020-01-13 DIAGNOSIS — E1151 Type 2 diabetes mellitus with diabetic peripheral angiopathy without gangrene: Secondary | ICD-10-CM | POA: Diagnosis not present

## 2020-01-13 DIAGNOSIS — I10 Essential (primary) hypertension: Secondary | ICD-10-CM | POA: Diagnosis not present

## 2020-01-13 DIAGNOSIS — E1142 Type 2 diabetes mellitus with diabetic polyneuropathy: Secondary | ICD-10-CM | POA: Diagnosis not present

## 2020-01-13 DIAGNOSIS — C61 Malignant neoplasm of prostate: Secondary | ICD-10-CM | POA: Diagnosis not present

## 2020-01-13 DIAGNOSIS — Z89422 Acquired absence of other left toe(s): Secondary | ICD-10-CM | POA: Diagnosis not present

## 2020-01-13 DIAGNOSIS — E11621 Type 2 diabetes mellitus with foot ulcer: Secondary | ICD-10-CM | POA: Diagnosis not present

## 2020-01-13 DIAGNOSIS — Z7901 Long term (current) use of anticoagulants: Secondary | ICD-10-CM | POA: Diagnosis not present

## 2020-01-13 DIAGNOSIS — L97514 Non-pressure chronic ulcer of other part of right foot with necrosis of bone: Secondary | ICD-10-CM | POA: Diagnosis not present

## 2020-01-13 DIAGNOSIS — Z7984 Long term (current) use of oral hypoglycemic drugs: Secondary | ICD-10-CM | POA: Diagnosis not present

## 2020-01-14 NOTE — Progress Notes (Signed)
TAHMIR, KLECKNER ([id number]) Visit Report for 01/11/2020 Arrival Information Details Patient Name: Date of Service: [name] 01/11/2020 2:45 PM Medical Record Number: [id number] Patient Account Number: [id number] Date of Birth/Sex: Treating RN: [birth year] (86 y.o. [name] Primary Care Parminder Cupples: Sherrie Mustache Other Clinician: Referring Zennie Ayars: Treating Kregg Cihlar/Extender: Deloria Lair in Treatment: 24 Visit Information History Since Last Visit Added or deleted any medications: No Patient Arrived: Wheel Chair Any new allergies or adverse reactions: No Arrival Time: 14:38 Had a fall or experienced change in No Accompanied By: daughter activities of daily living that may affect Transfer Assistance: None risk of falls: Patient Identification Verified: Yes Signs or symptoms of abuse/neglect since last visito No Secondary Verification Process Completed: Yes Hospitalized since last visit: No Patient Requires Transmission-Based Precautions: No Implantable device outside of the clinic excluding No Patient Has Alerts: Yes cellular tissue based products placed in the center Patient Alerts: R ABI non compressible since last visit: Has Dressing in Place as Prescribed: Yes Pain Present Now: No Electronic Signature(s) Signed: 01/11/2020 4:54:50 PM By: Levan Hurst RN, BSN Entered By: Levan Hurst on 01/11/2020 14:38:53 -------------------------------------------------------------------------------- Clinic Level of Care Assessment Details Patient Name: Date of Service: [name] [name] 01/11/2020 2:45 PM Medical Record Number: [id number] Patient Account Number: [id number] Date of Birth/Sex: Treating RN: [birth year] (86 y.o. [name] Primary Care Jaena Brocato: Sherrie Mustache Other Clinician: Referring Maisha Bogen: Treating Wahneta Derocher/Extender: Deloria Lair in Treatment: 24 Clinic Level of Care Assessment  Items TOOL 4 Quantity Score X- 1 0 Use when only an EandM is performed on FOLLOW-UP visit ASSESSMENTS - Nursing Assessment / Reassessment X- 1 10 Reassessment of Co-morbidities (includes updates in patient status) X- 1 5 Reassessment of Adherence to Treatment Plan ASSESSMENTS - Wound and Skin A ssessment / Reassessment []  - 0 Simple Wound Assessment / Reassessment - one wound X- 2 5 Complex Wound Assessment / Reassessment - multiple wounds X- 1 10 Dermatologic / Skin Assessment (not related to wound area) ASSESSMENTS - Focused Assessment []  - 0 Circumferential Edema Measurements - multi extremities X- 1 10 Nutritional Assessment / Counseling / Intervention []  - 0 Lower Extremity Assessment (monofilament, tuning fork, pulses) []  - 0 Peripheral Arterial Disease Assessment (using hand held doppler) ASSESSMENTS - Ostomy and/or Continence Assessment and Care []  - 0 Incontinence Assessment and Management []  - 0 Ostomy Care Assessment and Management (repouching, etc.) PROCESS - Coordination of Care []  - 0 Simple Patient / Family Education for ongoing care X- 1 20 Complex (extensive) Patient / Family Education for ongoing care X- 1 10 Staff obtains Programmer, systems, Records, T Results / Process Orders est X- 1 10 Staff telephones HHA, Nursing Homes / Clarify orders / etc []  - 0 Routine Transfer to another Facility (non-emergent condition) []  - 0 Routine Hospital Admission (non-emergent condition) []  - 0 New Admissions / Biomedical engineer / Ordering NPWT Apligraf, etc. , []  - 0 Emergency Hospital Admission (emergent condition) []  - 0 Simple Discharge Coordination X- 1 15 Complex (extensive) Discharge Coordination PROCESS - Special Needs []  - 0 Pediatric / Minor Patient Management []  - 0 Isolation Patient Management []  - 0 Hearing / Language / Visual special needs []  - 0 Assessment of Community assistance (transportation, D/C planning, etc.) []  - 0 Additional  assistance / Altered mentation []  - 0 Support Surface(s) Assessment (bed, cushion, seat, etc.) INTERVENTIONS - Wound Cleansing / Measurement []  - 0 Simple Wound Cleansing - one wound X- 2 5  Complex Wound Cleansing - multiple wounds X- 1 5 Wound Imaging (photographs - any number of wounds) []  - 0 Wound Tracing (instead of photographs) []  - 0 Simple Wound Measurement - one wound X- 2 5 Complex Wound Measurement - multiple wounds INTERVENTIONS - Wound Dressings X - Small Wound Dressing one or multiple wounds 1 10 X- 1 15 Medium Wound Dressing one or multiple wounds []  - 0 Large Wound Dressing one or multiple wounds []  - 0 Application of Medications - topical []  - 0 Application of Medications - injection INTERVENTIONS - Miscellaneous []  - 0 External ear exam []  - 0 Specimen Collection (cultures, biopsies, blood, body fluids, etc.) []  - 0 Specimen(s) / Culture(s) sent or taken to Lab for analysis []  - 0 Patient Transfer (multiple staff / Civil Service fast streamer / Similar devices) []  - 0 Simple Staple / Suture removal (25 or less) []  - 0 Complex Staple / Suture removal (26 or more) []  - 0 Hypo / Hyperglycemic Management (close monitor of Blood Glucose) []  - 0 Ankle / Brachial Index (ABI) - do not check if billed separately X- 1 5 Vital Signs Has the patient been seen at the hospital within the last three years: Yes Total Score: 155 Level Of Care: New/Established - Level 4 Electronic Signature(s) Signed: 01/11/2020 4:42:44 PM By: Deon Pilling Entered By: Deon Pilling on 01/11/2020 15:30:21 -------------------------------------------------------------------------------- Encounter Discharge Information Details Patient Name: Date of Service: [name] [name] 01/11/2020 2:45 PM Medical Record Number: [id number] Patient Account Number: [id number] Date of Birth/Sex: Treating RN: May 02, 1923 (86 y.o. [name] Primary Care Tipton Ballow: Sherrie Mustache Other Clinician: Referring  Latriece Anstine: Treating Lexy Meininger/Extender: Deloria Lair in Treatment: 24 Encounter Discharge Information Items Discharge Condition: Stable Ambulatory Status: Wheelchair Discharge Destination: Home Transportation: Private Auto Accompanied By: daughter Schedule Follow-up Appointment: Yes Clinical Summary of Care: Patient Declined Electronic Signature(s) Signed: 01/11/2020 4:52:26 PM By: Mikeal Hawthorne EMT/HBOT/SD Entered By: Mikeal Hawthorne on 01/11/2020 16:11:58 -------------------------------------------------------------------------------- Multi Wound Chart Details Patient Name: Date of Service: [name] [name] 01/11/2020 2:45 PM Medical Record Number: [id number] Patient Account Number: [id number] Date of Birth/Sex: Treating RN: [birth year] (86 y.o. [name], Meta.Reding Primary Care Amada Hallisey: Sherrie Mustache Other Clinician: Referring Magdelene Ruark: Treating Ezekial Arns/Extender: Deloria Lair in Treatment: 24 Vital Signs Height(in): 28 Capillary Blood Glucose(mg/dl): 170 Weight(lbs): 140 Pulse(bpm): 54 Body Mass Index(BMI): 20 Blood Pressure(mmHg): 155/80 Temperature(F): 97.5 Respiratory Rate(breaths/min): 16 Photos: [2:No Photos Right T Great oe] [3:No Photos Right Calcaneus] [N/A:N/A N/A] Wound Location: [2:Gradually Appeared] [3:Gradually Appeared] [N/A:N/A] Wounding Event: [2:Diabetic Wound/Ulcer of the Lower] [3:Pressure Ulcer] [N/A:N/A] Primary Etiology: [2:Extremity N/A] [3:Arterial Insufficiency Ulcer] [N/A:N/A] Secondary Etiology: [2:Cataracts, Arrhythmia, Hypertension, Cataracts, Arrhythmia, Hypertension,] [N/A:N/A] Comorbid History: [2:Peripheral Arterial Disease, Type II Peripheral Arterial Disease, Type II Diabetes, Osteomyelitis, Dementia, Diabetes, Osteomyelitis, Dementia, Neuropathy, Received Chemotherapy Neuropathy, Received Chemotherapy 06/15/2019]  [3:09/21/2019] [N/A:N/A] Date Acquired: [2:24] [3:12] [N/A:N/A] Weeks of  Treatment: [2:Open] [3:Open] [N/A:N/A] Wound Status: [2:0.5x0.6x0.2] [3:5.5x7x0.1] [N/A:N/A] Measurements L x W x D (cm) [2:0.236] [3:30.238] [N/A:N/A] A (cm) : rea [2:0.047] [3:3.024] [N/A:N/A] Volume (cm) : [2:-151.10%] [3:29.50%] [N/A:N/A] % Reduction in A rea: [2:-67.90%] [3:29.50%] [N/A:N/A] % Reduction in Volume: [2:Grade 2] [3:Unstageable/Unclassified] [N/A:N/A] Classification: [2:Small] [3:None Present] [N/A:N/A] Exudate A mount: [2:Serous] [3:N/A] [N/A:N/A] Exudate Type: [2:amber] [3:N/A] [N/A:N/A] Exudate Color: [2:Distinct, outline attached] [3:Flat and Intact] [N/A:N/A] Wound Margin: [2:Large (67-100%)] [3:None Present (0%)] [N/A:N/A] Granulation A mount: [2:Pink, Pale] [3:N/A] [N/A:N/A] Granulation Quality: [2:None Present (0%)] [3:Large (67-100%)] [N/A:N/A] Necrotic A mount: [2:N/A] [  3:Eschar] [N/A:N/A] Necrotic Tissue: [2:Fat Layer (Subcutaneous Tissue): Yes Fascia: No] [N/A:N/A] Exposed Structures: [2:Bone: Yes Fascia: No Tendon: No Muscle: No Joint: No Small (1-33%)] [3:Fat Layer (Subcutaneous Tissue): No Tendon: No Muscle: No Joint: No Bone: No Small (1-33%)] [N/A:N/A] Treatment Notes Wound #2 (Right Toe Great) 1. Cleanse With Wound Cleanser 3. Primary Dressing Applied Collegen AG Hydrogel or K-Y Jelly 4. Secondary Dressing Dry Gauze Roll Gauze 5. Secured With Tape Notes heel offloading sandal Wound #3 (Right Calcaneus) 1. Cleanse With Wound Cleanser 3. Primary Dressing Applied Other primary dressing (specifiy in notes) 4. Secondary Dressing Dry Gauze Roll Gauze Heel Cup 7. Footwear/Offloading device applied Other footwear/offloading device (specify in notes) Notes betadine to eschar on heel / heel offloading sandal Electronic Signature(s) Signed: 01/11/2020 4:42:44 PM By: Deon Pilling Signed: 01/14/2020 6:30:39 AM By: Linton Ham MD Entered By: Linton Ham on 01/11/2020  16:32:21 -------------------------------------------------------------------------------- Multi-Disciplinary Care Plan Details Patient Name: Date of Service: [name] [name] 01/11/2020 2:45 PM Medical Record Number: [id number] Patient Account Number: [id number] Date of Birth/Sex: Treating RN: 1923/11/24 (86 y.o. [name] Primary Care Yeudiel Mateo: Sherrie Mustache Other Clinician: Referring Tesa Meadors: Treating Adiel Erney/Extender: Deloria Lair in Treatment: 24 Active Inactive Wound/Skin Impairment Nursing Diagnoses: Knowledge deficit related to ulceration/compromised skin integrity Goals: Patient/caregiver will verbalize understanding of skin care regimen Date Initiated: 07/25/2019 Target Resolution Date: 01/20/2020 Goal Status: Active Ulcer/skin breakdown will have a volume reduction of 30% by week 4 Date Initiated: 07/25/2019 Date Inactivated: 08/28/2019 Target Resolution Date: 08/25/2019 Goal Status: Unmet Unmet Reason: Osteomyelitis Interventions: Assess patient/caregiver ability to obtain necessary supplies Assess patient/caregiver ability to perform ulcer/skin care regimen upon admission and as needed Assess ulceration(s) every visit Notes: Electronic Signature(s) Signed: 01/11/2020 4:42:44 PM By: Deon Pilling Entered By: Deon Pilling on 01/11/2020 14:33:13 -------------------------------------------------------------------------------- Pain Assessment Details Patient Name: Date of Service: [name] 01/11/2020 2:45 PM Medical Record Number: [id number] Patient Account Number: [id number] Date of Birth/Sex: Treating RN: 27-Sep-1923 (86 y.o. [name] Primary Care Bliss Tsang: Sherrie Mustache Other Clinician: Referring Javanna Patin: Treating Meshia Rau/Extender: Deloria Lair in Treatment: 24 Active Problems Location of Pain Severity and Description of Pain Patient Has Paino No Site Locations Pain  Management and Medication Current Pain Management: Electronic Signature(s) Signed: 01/11/2020 4:54:50 PM By: Levan Hurst RN, BSN Entered By: Levan Hurst on 01/11/2020 14:39:22 -------------------------------------------------------------------------------- Patient/Caregiver Education Details Patient Name: Date of Service: [name] 9/23/2021andnbsp2:45 PM Medical Record Number: [id number] Patient Account Number: [id number] Date of Birth/Gender: Treating RN: Apr 09, 1924 (86 y.o. [name] Primary Care Physician: Sherrie Mustache Other Clinician: Referring Physician: Treating Physician/Extender: Deloria Lair in Treatment: 24 Education Assessment Education Provided To: Patient Education Topics Provided Wound Debridement: Handouts: Wound Debridement Methods: Explain/Verbal Responses: Reinforcements needed Electronic Signature(s) Signed: 01/11/2020 4:42:44 PM By: Deon Pilling Entered By: Deon Pilling on 01/11/2020 14:33:28 -------------------------------------------------------------------------------- Wound Assessment Details Patient Name: Date of Service: [name] Schwartz E. 01/11/2020 2:45 PM Medical Record Number: [id number] Patient Account Number: [id number] Date of Birth/Sex: Treating RN: 1924-04-12 (86 y.o. [name] Primary Care Mauri Temkin: Sherrie Mustache Other Clinician: Referring Shardea Cwynar: Treating Kalyna Paolella/Extender: Deloria Lair in Treatment: 24 Wound Status Wound Number: 2 Primary Diabetic Wound/Ulcer of the Lower Extremity Etiology: Wound Location: Right T Great oe Wound Open Wounding Event: Gradually Appeared Status: Date Acquired: 06/15/2019 Comorbid Cataracts, Arrhythmia, Hypertension, Peripheral Arterial Disease, Weeks Of Treatment: 24 History: Type II Diabetes, Osteomyelitis, Dementia, Neuropathy, Received Clustered  Wound: No Chemotherapy Photos Photo Uploaded By: Mikeal Hawthorne on 01/12/2020 11:07:44 Wound Measurements Length: (cm) 0.5 Width: (cm) 0.6 Depth: (cm) 0.2 Area: (cm) 0.236 Volume: (cm) 0.047 % Reduction in Area: -151.1% % Reduction in Volume: -67.9% Epithelialization: Small (1-33%) Tunneling: No Undermining: No Wound Description Classification: Grade 2 Wound Margin: Distinct, outline attached Exudate Amount: Small Exudate Type: Serous Exudate Color: amber Foul Odor After Cleansing: No Slough/Fibrino No Wound Bed Granulation Amount: Large (67-100%) Exposed Structure Granulation Quality: Pink, Pale Fascia Exposed: No Necrotic Amount: None Present (0%) Fat Layer (Subcutaneous Tissue) Exposed: Yes Tendon Exposed: No Muscle Exposed: No Joint Exposed: No Bone Exposed: Yes Treatment Notes Wound #2 (Right Toe Great) 1. Cleanse With Wound Cleanser 3. Primary Dressing Applied Collegen AG Hydrogel or K-Y Jelly 4. Secondary Dressing Dry Gauze Roll Gauze 5. Secured With Tape Notes heel offloading sandal Electronic Signature(s) Signed: 01/11/2020 4:54:50 PM By: Levan Hurst RN, BSN Entered By: Levan Hurst on 01/11/2020 14:47:07 -------------------------------------------------------------------------------- Wound Assessment Details Patient Name: Date of Service: [name] [name] 01/11/2020 2:45 PM Medical Record Number: [id number] Patient Account Number: [id number] Date of Birth/Sex: Treating RN: [birth year] (86 y.o. [name] Primary Care Vianney Kopecky: Sherrie Mustache Other Clinician: Referring Jon Kasparek: Treating Elzina Devera/Extender: Deloria Lair in Treatment: 24 Wound Status Wound Number: 3 Primary Pressure Ulcer Etiology: Wound Location: Right Calcaneus Secondary Arterial Insufficiency Ulcer Wounding Event: Gradually Appeared Etiology: Date Acquired: 09/21/2019 Wound Open Weeks Of Treatment: 12 Status: Clustered Wound: No Comorbid Cataracts, Arrhythmia, Hypertension, Peripheral  Arterial Disease, History: Type II Diabetes, Osteomyelitis, Dementia, Neuropathy, Received Chemotherapy Photos Photo Uploaded By: Mikeal Hawthorne on 01/12/2020 11:07:45 Wound Measurements Length: (cm) 5.5 % Re Width: (cm) 7 % Re Depth: (cm) 0.1 Epit Area: (cm) 30.238 Tun Volume: (cm) 3.024 Und duction in Area: 29.5% duction in Volume: 29.5% helialization: Small (1-33%) neling: No ermining: No Wound Description Classification: Unstageable/Unclassified Fou Wound Margin: Flat and Intact Slo Exudate Amount: None Present l Odor After Cleansing: No ugh/Fibrino No Wound Bed Granulation Amount: None Present (0%) Exposed Structure Necrotic Amount: Large (67-100%) Fascia Exposed: No Necrotic Quality: Eschar Fat Layer (Subcutaneous Tissue) Exposed: No Tendon Exposed: No Muscle Exposed: No Joint Exposed: No Bone Exposed: No Treatment Notes Wound #3 (Right Calcaneus) 1. Cleanse With Wound Cleanser 3. Primary Dressing Applied Other primary dressing (specifiy in notes) 4. Secondary Dressing Dry Gauze Roll Gauze Heel Cup 7. Footwear/Offloading device applied Other footwear/offloading device (specify in notes) Notes betadine to eschar on heel / heel offloading sandal Electronic Signature(s) Signed: 01/11/2020 4:54:50 PM By: Levan Hurst RN, BSN Entered By: Levan Hurst on 01/11/2020 14:47:20 -------------------------------------------------------------------------------- Vitals Details Patient Name: Date of Service: [name] [name] 01/11/2020 2:45 PM Medical Record Number: [id number] Patient Account Number: [id number] Date of Birth/Sex: Treating RN: Jan 12, 1924 (86 y.o. [name] Primary Care Daurice Ovando: Sherrie Mustache Other Clinician: Referring Darielle Hancher: Treating Lavinia Mcneely/Extender: Deloria Lair in Treatment: 24 Vital Signs Time Taken: 14:38 Temperature (F): 97.5 Height (in): 70 Pulse (bpm): 86 Weight (lbs): 140 Respiratory Rate  (breaths/min): 16 Body Mass Index (BMI): 20.1 Blood Pressure (mmHg): 155/80 Capillary Blood Glucose (mg/dl): 170 Reference Range: 80 - 120 mg / dl Notes glucose per pt report Electronic Signature(s) Signed: 01/11/2020 4:54:50 PM By: Levan Hurst RN, BSN Entered By: Levan Hurst on 01/11/2020 14:39:17

## 2020-01-14 NOTE — Progress Notes (Signed)
[name], [name] ([id number]) Visit Report for 01/11/2020 HPI Details Patient Name: Date of Service: [name] 01/11/2020 2:45 PM Medical Record Number: [id number] Patient Account Number: [id number] Date of Birth/Sex: Treating RN: Sep 16, 1923 (86 y.o. [name] Primary Care Provider: Sherrie Mustache Other Clinician: Referring Provider: Treating Provider/Extender: Deloria Lair in Treatment: 24 History of Present Illness HPI Description: ADMISSION 05/02/2019 This is a 86 year old man accompanied by his daughter. He lives in [place] with his elderly wife. He has home caregivers as well. Apparently on February 28, 2019 they noted blood on his sock and noticed a wound on the tip of his right great toe. He has been cared for by Dr. Posey Pronto who is his podiatrist in [place]. Apparently they have been using Betadine to this area. The patient has a thick mycotic nail on the right as well which is embedded in the wound in some areas. Fortunately the patient has diabetic neuropathy and is not really experiencing any pain otherwise I think this would all be quite painful. The patient was previously treated in 2019 for wounds on the left foot. This included a fairly thorough vascular review by vein and vascular Dr. Oneida Alar. His ABIs at that time were noncompressible bilaterally TBI on the right was 0.88 with biphasic waveforms on the right and monophasic waveforms at the left. He ended up having amputations of the left first and second toes. He did have an angiogram on 06/25/2017 which showed on the right the right common femoral profundofemoral for Morris and superficial femoral arteries were widely patent the right popliteal was widely patent. Tibial vessels were not well opacified secondary to motion artifact however he was felt to have all 3 tibial vessels intact with some calcification and some areas of stenosis within the posterior tibial artery  but relatively good flow to the right lower leg. He also had the left reviewed as well which was the source of the problem at that time. Past medical history; prostate cancer on oral antiandrogen therapy with bone mets, GI stromal tumor in 2005, type 2 diabetes with peripheral neuropathy, hypertension, atrial fibrillation on Eliquis, history of non-Hodgkin's lymphoma treated with CHOP in 2005. He had amputations of the left first and second toes secondary to osteomyelitis. Does not appear that he has had an x-ray of the right foot ABI in our clinic on the right was noncompressible [see full arterial discussion above]. 1/19; area on the tip of the right great toe under a very mycotic nail tip. We use silver alginate. 1/26; area of the tip of the right great toe under a very mycotic nail. A lot of this seems to have closed down we have been using silver alginate 2/9; the areas on the tip of the right great toe under a very mycotic nail. A lot of this continues to close down. I do not think there is a subungual problem. I am hopeful that this mycotic nail which is thick and raised will allow full epithelialization of the wound 2/23; this was a difficult area on the tip of his right great toe. Thick mycotic nail over the base of the wound. This appears to have closed down now. The nail itself is thick split with considerable subungual debris. READMISSION 07/25/2019 This is a 86 year old man that we cared for earlier this year in the clinic without a wound on the tip of his right great toe under a very mycotic toenail. He is a type II diabetic. His  ABIs have been noncompressible however he has been felt to have adequate blood flow in the right to heal the wound. We were able to get this wound to close over as I understand things he followed with Dr. Caprice Beaver had some debridement of the nail everything looks fine and apparently the wound bed was washed off by his wife over the weekend and by Monday it was  open. His daughter is able to show me pictures. Unfortunately this now has exposed bone raising the possibility that he has had underlying osteomyelitis all along. He has been using Silvadene cream on this he has been referred back to the clinic by Dr. Caprice Beaver. He had x-rays of the right foot that did not show evidence of osteomyelitis in the right foot. He has not been on antibiotics He also had an x-ray of the left foot that showed findings suspicious for osteomyelitis involving the residual base of the first proximal phalanx and the proximal phalanx of the left second toe as well as the left second metatarsal head however he has no wounds in this area. 4/12; culture from last time of bone in the right great toe was negative. The patient's wife apparently scrubbed this toe vigorously and according to his daughter reopen the wound however unfortunately it is all exposed bone last time. I removed some of this and we use Prisma unfortunately the area does not look much better today. Previous x-rays done in podiatry office Dr. Caprice Beaver did not show evidence of osteomyelitis. Nevertheless I think osteomyelitis here is probably quite likely. Still using Prisma 4/26; he took 2 weeks of doxycycline although his daughter is telling me he is not eating. He has loose bowel movements however I did not get the sense that this is changed all that much. He still has exposed bone. Culture I did last time showed a few coag negative staph I do not think that this is necessarily relevant. I still am operating under the premise that the patient probably has osteomyelitis. After considerable difficulty we are able to determine he is not allergic to cephalosporins I therefore gave him Keflex. 5/10; is tolerating Keflex 500 3 times daily quite well. He still has the small wound in the tip of his toe with exposed bone.Marland Kitchen Unfortunately he does not really have any granulation tissue. He saw Dr. Caprice Beaver of podiatry in  [place] last week I have not had a chance to look at his notes The patient had an extensive vascular work-up including an angiogram in 2019. He was felt to have adequate blood supply on the right at the time although he did have amputations of the left first and second toe by Dr. Oneida Alar. 09/11/19 -Patient is back and is about to finish his Keflex, appointment with Dr. Oneida Alar is in the works. The right great toe wound looks about the same, the left third toe dorsal wound has healed. There was a small eschar covering it that was removed by podiatry.According to patient's daughter the right great toe base on the plantar surface had some redness that seems to have disappeared. 10/13/2019; patient is back in clinic today after 1 month hiatus although I have not seen him since 5/10. Since he was last here he was admitted to hospital from 6/2 through 6/5 because of a nonhealing wound on his right great toe. He underwent an angiogram which showed patent common femoral, profunda, SFA above and below-knee popliteal artery. The dominant runoff in the right lower extremity was through the anterior tibial  but that became very small at the level of the ankle and had minimal outflow into the foot. The peroneal artery was patent but atretic. 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 Interventions were attempted on the posterior tibial. Mechanical orbital atherectomy down to the level of the ankle and then this was angioplastied with 2.3 mm and 3 mm balloons. Flow remained very sluggish down the posterior tibial artery but was patent. Post event there apparently was embolization of an area involving the plantar left heel and sides of the left heel. He arrives back in clinic today it has been almost 7 weeks since I have seen him. Miraculously the great toe area with open bone is closed over  probably as result of Dr. Ainsley Spinner interventions. He does however have an extensive ischemic area on the plantar and medial aspect of his heel. I reviewed Dr. Ainsley Spinner last note from 3 days ago on 6/22. He did not think there was enough inflow to heal the heel wound. With eschar and sloughing. Wondered whether he could heal a below-knee amputation but more likely an above the knee amputation when required. He was placed on Xeroform to the right heel In discussing things with the patient's family and the patient present he is not in a lot of pain. The area does not appear to be grossly infected. The patient appears to be comfortable and conversational. He is eating and drinking fairly well. 7/9; the patient saw Dr. Carlis Abbott on 6/29. He had previously done heroic attempt to establish blood flow as described above. 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 Although the eschar on the heel looks ominous both plantar and medial it is certainly no worse than last week. We will continue Betadine to this area with alginate. Ultimately this will separate off and we will see about the viability of the tissue underneath. Also in clinic today our nurses were washing his right great toe tip apparently some of the skin came off eschared and there is an open wound at the tip of the great toe with exposed bone similar to what he had previously 7/23; he has the open area on the tip of his first toe which I think is mostly bone. Then he has the embolic area on the plantar heel extending into the medial heel. We have been using Betadine and silver alginate to the heel silver alginate to the toe. His daughter brings up that she was given nitroglycerin patches by Dr. Donzetta Matters to try and open blood flow to the foot. She has not been using them out of concern of systemic hypotension and also that the labels said not to  put them on extremities 8/10-Returns at 3 weeks, open area on the tip of his first toe that is dried out, the heel with eschar, applying Betadine to the eschar area and silver alginate dressing. 9/7; 1 month follow-up. He has an open area on the tip of the toe almost subungual with a thick mycotic nail. The heel wound has black eschar but this is beginning to separate. We have been using Betadine and silver alginate on the heel and simply silver alginate on the right great toe 9/23; the patient was worked in urgently at the request of his wife was concerned about infection in the tip of the  toe. Apparently this was brought up by home health. Electronic Signature(s) Signed: 01/14/2020 6:30:39 AM By: Linton Ham MD Entered By: Linton Ham on 01/11/2020 16:33:12 -------------------------------------------------------------------------------- Physical Exam Details Patient Name: Date of Service: [name] 01/11/2020 2:45 PM Medical Record Number: [id number] Patient Account Number: [id number] Date of Birth/Sex: Treating RN: May 17, 1923 (86 y.o. [name] Primary Care Provider: Sherrie Mustache Other Clinician: Referring Provider: Treating Provider/Extender: Deloria Lair in Treatment: 24 Constitutional Patient is hypertensive.. Pulse regular and within target range for patient.Marland Kitchen Respirations regular, non-labored and within target range.. Temperature is normal and within the target range for the patient.Marland Kitchen Appears in no distress. Cardiovascular Pedal pulses are nonpalpable on the right.. Notes Wound exam; right great toe on the tip has 100% exposed bone. I could remove this but I do not think this wound is going to heal. I talked to his daughter about this. The area on the right heel is still eschared it is slowly separating There is no evidence of infection in either area Electronic Signature(s) Signed: 01/14/2020 6:30:39 AM By: Linton Ham  MD Entered By: Linton Ham on 01/11/2020 16:35:44 -------------------------------------------------------------------------------- Physician Orders Details Patient Name: Date of Service: [name] 01/11/2020 2:45 PM Medical Record Number: [id number] Patient Account Number: [id number] Date of Birth/Sex: Treating RN: 25-Sep-1923 (86 y.o. [name] Primary Care Provider: Sherrie Mustache Other Clinician: Referring Provider: Treating Provider/Extender: Deloria Lair in Treatment: 24 Verbal / Phone Orders: No Diagnosis Coding ICD-10 Coding Code Description E11.621 Type 2 diabetes mellitus with foot ulcer L97.514 Non-pressure chronic ulcer of other part of right foot with necrosis of bone E11.40 Type 2 diabetes mellitus with diabetic neuropathy, unspecified Follow-up Appointments Return appointment in 1 month. Dressing Change Frequency Wound #2 Right T Great oe Change Dressing every other day. - home health to change 3x/week. Wound #3 Right Calcaneus Change dressing every day. - home health to change 3x/week and family to change on other days Wound Cleansing May shower and wash wound with soap and water. - on days that dressing is changed Primary Wound Dressing Wound #2 Right T Great oe Silver Collagen - moisten with hydrogel or [place] Jelly. Wound #3 Right Calcaneus Other: - betadine on heel. Secondary Dressing Wound #3 Right Calcaneus Kerlix/Rolled Gauze Dry Gauze Heel [place] skilled [facility] for wound care. - [facility] Electronic Signature(s) Signed: 01/11/2020 4:42:44 PM By: Deon Pilling Signed: 01/14/2020 6:30:39 AM By: Linton Ham MD Entered By: Deon Pilling on 01/11/2020 15:31:49 -------------------------------------------------------------------------------- Problem List Details Patient Name: Date of Service: [name] 01/11/2020 2:45 PM Medical Record Number: [id number] Patient  Account Number: [id number] Date of Birth/Sex: Treating RN: Apr 10, 1924 (86 y.o. [name] Primary Care Provider: Sherrie Mustache Other Clinician: Referring Provider: Treating Provider/Extender: Deloria Lair in Treatment: 24 Active Problems ICD-10 Encounter Code Description Active Date MDM Diagnosis E11.621 Type 2 diabetes mellitus with foot ulcer 07/25/2019 No Yes L97.514 Non-pressure chronic ulcer of other part of right foot with necrosis of bone 07/25/2019 No Yes E11.40 Type 2 diabetes mellitus with diabetic neuropathy, unspecified 07/25/2019 No Yes Inactive Problems Resolved Problems Electronic Signature(s) Signed: 01/14/2020 6:30:39 AM By: Linton Ham MD Entered By: Linton Ham on 01/11/2020 16:32:11 -------------------------------------------------------------------------------- Progress Note Details Patient Name: Date of Service: [name] 01/11/2020 2:45 PM Medical Record Number: [id number] Patient Account Number: [id number] Date of Birth/Sex: Treating RN: Nov 15, 1923 (86 y.o. Lorette Ang, Tammi Klippel Primary  Care Provider: Sherrie Mustache Other Clinician: Referring Provider: Treating Provider/Extender: Deloria Lair in Treatment: 24 Subjective History of Present Illness (HPI) ADMISSION 05/02/2019 This is a 86 year old man accompanied by his daughter. He lives in [place] with his elderly wife. He has home caregivers as well. Apparently on February 28, 2019 they noted blood on his sock and noticed a wound on the tip of his right great toe. He has been cared for by Dr. Posey Pronto who is his podiatrist in [place]. Apparently they have been using Betadine to this area. The patient has a thick mycotic nail on the right as well which is embedded in the wound in some areas. Fortunately the patient has diabetic neuropathy and is not really experiencing any pain otherwise I think this would all be  quite painful. The patient was previously treated in 2019 for wounds on the left foot. This included a fairly thorough vascular review by vein and vascular Dr. Oneida Alar. His ABIs at that time were noncompressible bilaterally TBI on the right was 0.88 with biphasic waveforms on the right and monophasic waveforms at the left. He ended up having amputations of the left first and second toes. He did have an angiogram on 06/25/2017 which showed on the right the right common femoral profundofemoral for Morris and superficial femoral arteries were widely patent the right popliteal was widely patent. Tibial vessels were not well opacified secondary to motion artifact however he was felt to have all 3 tibial vessels intact with some calcification and some areas of stenosis within the posterior tibial artery but relatively good flow to the right lower leg. He also had the left reviewed as well which was the source of the problem at that time. Past medical history; prostate cancer on oral antiandrogen therapy with bone mets, GI stromal tumor in 2005, type 2 diabetes with peripheral neuropathy, hypertension, atrial fibrillation on Eliquis, history of non-Hodgkin's lymphoma treated with CHOP in 2005. He had amputations of the left first and second toes secondary to osteomyelitis. Does not appear that he has had an x-ray of the right foot ABI in our clinic on the right was noncompressible [see full arterial discussion above]. 1/19; area on the tip of the right great toe under a very mycotic nail tip. We use silver alginate. 1/26; area of the tip of the right great toe under a very mycotic nail. A lot of this seems to have closed down we have been using silver alginate 2/9; the areas on the tip of the right great toe under a very mycotic nail. A lot of this continues to close down. I do not think there is a subungual problem. I am hopeful that this mycotic nail which is thick and raised will allow full epithelialization  of the wound 2/23; this was a difficult area on the tip of his right great toe. Thick mycotic nail over the base of the wound. This appears to have closed down now. The nail itself is thick split with considerable subungual debris. READMISSION 07/25/2019 This is a 86 year old man that we cared for earlier this year in the clinic without a wound on the tip of his right great toe under a very mycotic toenail. He is a type II diabetic. His ABIs have been noncompressible however he has been felt to have adequate blood flow in the right to heal the wound. We were able to get this wound to close over as I understand things he followed with Dr. Caprice Beaver had some  debridement of the nail everything looks fine and apparently the wound bed was washed off by his wife over the weekend and by Monday it was open. His daughter is able to show me pictures. Unfortunately this now has exposed bone raising the possibility that he has had underlying osteomyelitis all along. He has been using Silvadene cream on this he has been referred back to the clinic by Dr. Caprice Beaver. He had x-rays of the right foot that did not show evidence of osteomyelitis in the right foot. He has not been on antibiotics He also had an x-ray of the left foot that showed findings suspicious for osteomyelitis involving the residual base of the first proximal phalanx and the proximal phalanx of the left second toe as well as the left second metatarsal head however he has no wounds in this area. 4/12; culture from last time of bone in the right great toe was negative. The patient's wife apparently scrubbed this toe vigorously and according to his daughter reopen the wound however unfortunately it is all exposed bone last time. I removed some of this and we use Prisma unfortunately the area does not look much better today. Previous x-rays done in podiatry office Dr. Caprice Beaver did not show evidence of osteomyelitis. Nevertheless I think osteomyelitis here  is probably quite likely. Still using Prisma 4/26; he took 2 weeks of doxycycline although his daughter is telling me he is not eating. He has loose bowel movements however I did not get the sense that this is changed all that much. He still has exposed bone. Culture I did last time showed a few coag negative staph I do not think that this is necessarily relevant. I still am operating under the premise that the patient probably has osteomyelitis. After considerable difficulty we are able to determine he is not allergic to cephalosporins I therefore gave him Keflex. 5/10; is tolerating Keflex 500 3 times daily quite well. He still has the small wound in the tip of his toe with exposed bone.Marland Kitchen Unfortunately he does not really have any granulation tissue. He saw Dr. Caprice Beaver of podiatry in [place] last week I have not had a chance to look at his notes The patient had an extensive vascular work-up including an angiogram in 2019. He was felt to have adequate blood supply on the right at the time although he did have amputations of the left first and second toe by Dr. Oneida Alar. 09/11/19 -Patient is back and is about to finish his Keflex, appointment with Dr. Oneida Alar is in the works. The right great toe wound looks about the same, the left third toe dorsal wound has healed. There was a small eschar covering it that was removed by podiatry.According to patient's daughter the right great toe base on the plantar surface had some redness that seems to have disappeared. 10/13/2019; patient is back in clinic today after 1 month hiatus although I have not seen him since 5/10. Since he was last here he was admitted to hospital from 6/2 through 6/5 because of a nonhealing wound on his right great toe. He underwent an angiogram which showed patent common femoral, profunda, SFA above and below-knee popliteal artery. The dominant runoff in the right lower extremity was through the anterior tibial but that became very small  at the level of the ankle and had minimal outflow into the foot. The peroneal artery was patent but atretic. The posterior tibial flow was much slower in the artery appeared to have multiple focal high-grade stenoses  as well as short chronic total occlusion in the distal segment. Interventions were attempted on the posterior tibial. Mechanical orbital atherectomy down to the level of the ankle and then this was angioplastied with 2.3 mm and 3 mm balloons. Flow remained very sluggish down the posterior tibial artery but was patent. Post event there apparently was embolization of an area involving the plantar left heel and sides of the left heel. He arrives back in clinic today it has been almost 7 weeks since I have seen him. Miraculously the great toe area with open bone is closed over probably as result of Dr. Ainsley Spinner interventions. He does however have an extensive ischemic area on the plantar and medial aspect of his heel. I reviewed Dr. Ainsley Spinner last note from 3 days ago on 6/22. He did not think there was enough inflow to heal the heel wound. With eschar and sloughing. Wondered whether he could heal a below-knee amputation but more likely an above the knee amputation when required. He was placed on Xeroform to the right heel In discussing things with the patient's family and the patient present he is not in a lot of pain. The area does not appear to be grossly infected. The patient appears to be comfortable and conversational. He is eating and drinking fairly well. 7/9; the patient saw Dr. Carlis Abbott on 6/29. He had previously done heroic attempt to establish blood flow as described above. 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 Although the eschar on the heel looks ominous both plantar and medial it is certainly no worse than last week. We will continue Betadine to this area with alginate.  Ultimately this will separate off and we will see about the viability of the tissue underneath. Also in clinic today our nurses were washing his right great toe tip apparently some of the skin came off eschared and there is an open wound at the tip of the great toe with exposed bone similar to what he had previously 7/23; he has the open area on the tip of his first toe which I think is mostly bone. Then he has the embolic area on the plantar heel extending into the medial heel. We have been using Betadine and silver alginate to the heel silver alginate to the toe. His daughter brings up that she was given nitroglycerin patches by Dr. Donzetta Matters to try and open blood flow to the foot. She has not been using them out of concern of systemic hypotension and also that the labels said not to put them on extremities 8/10-Returns at 3 weeks, open area on the tip of his first toe that is dried out, the heel with eschar, applying Betadine to the eschar area and silver alginate dressing. 9/7; 1 month follow-up. He has an open area on the tip of the toe almost subungual with a thick mycotic nail. The heel wound has black eschar but this is beginning to separate. We have been using Betadine and silver alginate on the heel and simply silver alginate on the right great toe 9/23; the patient was worked in urgently at the request of his wife was concerned about infection in the tip of the toe. Apparently this was brought up by home health. Objective Constitutional Patient is hypertensive.. Pulse regular and within target range for patient.Marland Kitchen Respirations regular, non-labored and within target range.. Temperature is normal and within the target range for the patient.Marland Kitchen Appears in no  distress. Vitals Time Taken: 2:38 PM, Height: 70 in, Weight: 140 lbs, BMI: 20.1, Temperature: 97.5 F, Pulse: 86 bpm, Respiratory Rate: 16 breaths/min, Blood Pressure: 155/80 mmHg, Capillary Blood Glucose: 170 mg/dl. General Notes: glucose  per pt report Cardiovascular Pedal pulses are nonpalpable on the right.. General Notes: Wound exam; right great toe on the tip has 100% exposed bone. I could remove this but I do not think this wound is going to heal. I talked to his daughter about this. ooThe area on the right heel is still eschared it is slowly separating ooThere is no evidence of infection in either area Integumentary (Hair, Skin) Wound #2 status is Open. Original cause of wound was Gradually Appeared. The wound is located on the Right T Great. The wound measures 0.5cm length x oe 0.6cm width x 0.2cm depth; 0.236cm^2 area and 0.047cm^3 volume. There is bone and Fat Layer (Subcutaneous Tissue) exposed. There is no tunneling or undermining noted. There is a small amount of serous drainage noted. The wound margin is distinct with the outline attached to the wound base. There is large (67-100%) pink, pale granulation within the wound bed. There is no necrotic tissue within the wound bed. Wound #3 status is Open. Original cause of wound was Gradually Appeared. The wound is located on the Right Calcaneus. The wound measures 5.5cm length x 7cm width x 0.1cm depth; 30.238cm^2 area and 3.024cm^3 volume. There is no tunneling or undermining noted. There is a none present amount of drainage noted. The wound margin is flat and intact. There is no granulation within the wound bed. There is a large (67-100%) amount of necrotic tissue within the wound bed including Eschar. Assessment Active Problems ICD-10 Type 2 diabetes mellitus with foot ulcer Non-pressure chronic ulcer of other part of right foot with necrosis of bone Type 2 diabetes mellitus with diabetic neuropathy, unspecified Plan Follow-up Appointments: Return appointment in 1 month. Dressing Change Frequency: Wound #2 Right T Great: oe Change Dressing every other day. - home health to change 3x/week. Wound #3 Right Calcaneus: Change dressing every day. - home health to  change 3x/week and family to change on other days Wound Cleansing: May shower and wash wound with soap and water. - on days that dressing is changed Primary Wound Dressing: Wound #2 Right T Great: oe Silver Collagen - moisten with hydrogel or [place] Jelly. Wound #3 Right Calcaneus: Other: - betadine on heel. Secondary Dressing: Wound #3 Right Calcaneus: Kerlix/Rolled Gauze Dry Gauze Heel Cup Home Health: [facility] for wound care. - [facility] 1. Marked deterioration in the wound but no evidence of infection on the tip of the right great toe 2. I talked about removing more bone from the surface of this although I think the best approach here would be a palliative approach. I do not think this is going to heal we will keep it clean moist and the best dressing for this is silver collagen 3. The heel is eschared but separating we are using Betadine and silver alginate this should continue 4. I did bring up the idea of amputation of part of the toe although I am not sure he has the vascular supply to heal this Electronic Signature(s) Signed: 01/14/2020 6:30:39 AM By: Linton Ham MD Entered By: Linton Ham on 01/11/2020 16:37:09 -------------------------------------------------------------------------------- SuperBill Details Patient Name: Date of Service: [name] 01/11/2020 Medical Record Number: [id number] Patient Account Number: [id number] Date of Birth/Sex: Treating RN: October 20, 1923 (86 y.o. Lorette Ang, Tammi Klippel Primary  Care Provider: Sherrie Mustache Other Clinician: Referring Provider: Treating Provider/Extender: Deloria Lair in Treatment: 24 Diagnosis Coding ICD-10 Codes Code Description E11.621 Type 2 diabetes mellitus with foot ulcer L97.514 Non-pressure chronic ulcer of other part of right foot with necrosis of bone E11.40 Type 2 diabetes mellitus with diabetic neuropathy, unspecified Facility Procedures CPT4  Code: 16109604 Description: 99214 - WOUND CARE VISIT-LEV 4 EST PT Modifier: Quantity: 1 Physician Procedures : CPT4 Code Description Modifier [id number] - WC PHYS LEVEL 3 - EST PT ICD-10 Diagnosis Description L97.514 Non-pressure chronic ulcer of other part of right foot with necrosis of bone E11.621 Type 2 diabetes mellitus with foot ulcer Quantity: 1 Electronic Signature(s) Signed: 01/14/2020 6:30:39 AM By: Linton Ham MD Entered By: Linton Ham on 01/11/2020 16:37:31

## 2020-01-16 ENCOUNTER — Encounter: Payer: Self-pay | Admitting: Nurse Practitioner

## 2020-01-16 ENCOUNTER — Ambulatory Visit (INDEPENDENT_AMBULATORY_CARE_PROVIDER_SITE_OTHER): Payer: Medicare Other | Admitting: Vascular Surgery

## 2020-01-16 ENCOUNTER — Encounter: Payer: Self-pay | Admitting: Vascular Surgery

## 2020-01-16 ENCOUNTER — Other Ambulatory Visit: Payer: Self-pay

## 2020-01-16 VITALS — BP 150/79 | HR 70 | Temp 97.2°F | Resp 16 | Ht 70.0 in | Wt 140.0 lb

## 2020-01-16 DIAGNOSIS — E1169 Type 2 diabetes mellitus with other specified complication: Secondary | ICD-10-CM

## 2020-01-16 DIAGNOSIS — I739 Peripheral vascular disease, unspecified: Secondary | ICD-10-CM | POA: Diagnosis not present

## 2020-01-16 DIAGNOSIS — E1142 Type 2 diabetes mellitus with diabetic polyneuropathy: Secondary | ICD-10-CM | POA: Diagnosis not present

## 2020-01-16 DIAGNOSIS — B351 Tinea unguium: Secondary | ICD-10-CM | POA: Diagnosis not present

## 2020-01-16 DIAGNOSIS — E1151 Type 2 diabetes mellitus with diabetic peripheral angiopathy without gangrene: Secondary | ICD-10-CM | POA: Diagnosis not present

## 2020-01-16 MED ORDER — GLIPIZIDE 5 MG PO TABS: 5.0000 mg | ORAL_TABLET | Freq: Every day | ORAL | 1 refills | Status: DC

## 2020-01-16 MED ORDER — TRAMADOL HCL 50 MG PO TABS
50.0000 mg | ORAL_TABLET | Freq: Four times a day (QID) | ORAL | 0 refills | Status: DC | PRN
Start: 2020-01-16 — End: 2020-05-14

## 2020-01-16 NOTE — Progress Notes (Signed)
Patient name: [name] MRN: [id number] DOB: 07-19-1923 Sex: male  REASON FOR VISIT: Follow-up for right heel wound  HPI: [name] is a 86 y.o. male with history of diabetes, atrial fibrillation, hypertension, hyperlipidemia, PAD that presents for ongoing follow-up of right foot tissue loss.  He previously underwent right posterior tibial intervention for critical limb ischemia with tissue loss (great toe ulcer) on 09/20/19.  Postop course was complicated by syncopal event while he was having a bowel movement in recovery and he was admitted for observation.  He then had ongoing issues with orthostasis and this was felt to be possibly related to adrenal insufficiency but as treated with steroids and appreciate the hospitalist input.  Ultimately during PT intervention he had an embolic event to the heel.  He has been going to wound clinic with the help of Dr. Dellia Nims.  Pain is much better and now has just taking a quarter of a tramadol at night.  The wound on the heel has remained dry.  The right great toe ulcer is also stable.  No active signs of infection.    Past Medical History:  Diagnosis Date  . Acute bronchitis   . Acute lower UTI 11/25/2017  . Amputation of toe of left foot ([place]) 07/30/2017, 08/27/2017    Great toe 07/30/2017 Second toe 08/27/2017   . Arthritis   . Aspiration pneumonia ([place]) 01/31/2018  . Atrial fibrillation ([place])    Dr. Harl Bowie- [facility] follows saw 11'14  . Bladder stones    tx. with oral meds and antibiotics, now surgery planned  . Bone metastases ([place]) 08/21/2015  . Cataracts, both eyes    surgery planned May 2015  . Community acquired pneumonia 04/16/2018  . Cystoid macular edema 04/21/2015    Right eye 04/21/2015   . Dehydration 04/16/2018  . Dementia without behavioral disturbance ([place]) 04/16/2018  . Diabetes mellitus without complication ([place])    Type II  . Diarrhea 11/25/2017  . Dyspnea    with activity  . Dysrhythmia    Afib  . Family  history of breast cancer   . Family history of colon cancer   . Genetic testing 02/09/2017  . GERD (gastroesophageal reflux disease)   . 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 Margot Chimes, thus far without recurrence.   Marland Kitchen History of MRI of lumbar spine 03/05/2014  . Hypertension   . Hypertrophy of prostate with urinary obstruction   . Hypomagnesemia 03/16/2017  . Metabolic encephalopathy 28/41/3244  . Metastatic adenocarcinoma to prostate ([place]) 09/08/2013  . 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 NHL (non-Hodgkin's lymphoma) ([place]) 2005   Diffuse large B-cell lymphoma, clinically stage IIIA, CD20 positive, status post cervical lymph node biopsy 08/03/2003 on the left. PET scan was also positive in the spleen and small bowel region, but bone marrow aspiration and biopsy were negative. So he essentially had stage IIIAs. He received R-CHOP x6 cycles with CR established by PET scan criteria on 11/23/2003 with no evidence for relapse th  . Numbness 02/19/2017   Per [facility] new patient packet  . Osteomyelitis ([place]) 06/29/2017   toe of left foot   . PAD (peripheral artery disease) ([place]) 08/27/2017  . Polyneuropathy 02/19/2017  . Postural dizziness with presyncope 02/19/2017  . S/P TURP 08/10/2013  .  Sepsis ([place]) 07/02/2018  . Skin cancer    Basal cell- face,head, neck,  arms, legs, Back  . Urinary frequency 02/19/2017    Past Surgical History:  Procedure Laterality Date  . ABDOMINAL AORTOGRAM W/LOWER EXTREMITY N/A 09/20/2019   Procedure: ABDOMINAL AORTOGRAM W/LOWER EXTREMITY;  Surgeon: Marty Heck, MD;  Location: [facility];  Service: Cardiovascular;  Laterality: N/A;  . AMPUTATION Left 06/29/2017   Procedure: AMPUTATION LEFT GREAT TOE;   Surgeon: [name], MD;  Location: [facility];  Service: Vascular;  Laterality: Left;  . AMPUTATION Left 08/27/2017   Procedure: AMPUTATION Left SECOND TOE;  Surgeon: [name], MD;  Location: [facility];  Service: Vascular;  Laterality: Left;  . CATARACT EXTRACTION Bilateral 2015  . CHOLECYSTECTOMY    . COLON RESECTION     small bowel  . CYSTOSCOPY WITH LITHOLAPAXY N/A 08/10/2013   Procedure: CYSTOSCOPY WITH LITHOLAPAXY WITH [name];  Surgeon: [name], MD;  [facility];  Service: Urology;  Laterality: N/A;  . ESOPHAGEAL DILATION N/A 02/27/2016   Procedure: ESOPHAGEAL DILATION;  Surgeon: [name], MD;  [facility];  Service: Endoscopy;  Laterality: N/A;  . ESOPHAGEAL DILATION N/A 07/07/2018   Procedure: ESOPHAGEAL DILATION;  Surgeon: [name], MD;  [facility];  Service: Endoscopy;  Laterality: N/A;  . ESOPHAGOGASTRODUODENOSCOPY N/A 02/27/2016   Procedure: ESOPHAGOGASTRODUODENOSCOPY (EGD);  Surgeon: [name], MD;  [facility];  Service: Endoscopy;  Laterality: N/A;  12:00  . ESOPHAGOGASTRODUODENOSCOPY N/A 07/07/2018   Procedure: ESOPHAGOGASTRODUODENOSCOPY (EGD);  Surgeon: [name], MD;  [facility];  Service: Endoscopy;  Laterality: N/A;  . INCISION AND DRAINAGE PERIRECTAL ABSCESS    . LOWER EXTREMITY ANGIOGRAPHY N/A 06/25/2017   Procedure: LOWER EXTREMITY ANGIOGRAPHY;  Surgeon: [name], MD;  Location: [facility];  Service: Cardiovascular;  Laterality: N/A;  . LYMPH NODE DISSECTION Left    '05-neck  . PERIPHERAL VASCULAR ATHERECTOMY Right 09/20/2019   Procedure: PERIPHERAL VASCULAR ATHERECTOMY;  Surgeon: Marty Heck, MD;  Location: [facility];  Service: Cardiovascular;  Laterality: Right;  Posterior tibial  . PORT-A-CATH REMOVAL    . PORTACATH PLACEMENT     insertion and removal -last chemotherapy 10 yrs ago  . TRANSURETHRAL RESECTION OF PROSTATE N/A 08/10/2013    Procedure: TRANSURETHRAL RESECTION OF THE PROSTATE WITH GYRUS INSTRUMENTS;  Surgeon: [name], MD;  [facility];  Service: Urology;  Laterality: N/A;    Family History  Problem Relation Age of Onset  . Heart attack Mother        died in her 66s  . Other Father 32       pneumonia - died  . Breast cancer Sister        dx in her 75s-60s  . Colon cancer Brother        dx in his 7s  . Cancer Sister        TAH/BSO due to "male cancer"  . Breast cancer Daughter 76  . Breast cancer Daughter 72       DCIS - ATM VUS on [employer] 83 gene panel in 2018  . Breast cancer Daughter 71       DCIS - Negative on [employer] MyRisk 25 gene apnel in 2015  . Thyroid cancer Daughter 74       Medullary and Papillary  . Thyroid nodules Grandchild     SOCIAL HISTORY: Social History   Tobacco Use  .  Smoking status: Never Smoker  . Smokeless tobacco: Never Used  Substance Use Topics  . Alcohol use: No    Allergies  Allergen Reactions  . Tape Other (See Comments)    SKIN IS VERY THIN AND WILL TEAR AND BRUISE VERY EASILY!!!!  . Augmentin [Amoxicillin-Pot Clavulanate] Rash and Other (See Comments)    Redness of skin    Current Outpatient Medications  Medication Sig Dispense Refill  . abiraterone acetate (ZYTIGA) 250 MG tablet Take 1 tablet (250 mg total) by mouth daily before breakfast. Take on an empty stomach 1 hour before or 2 hours after a meal 120 tablet 0  . acetaminophen (TYLENOL) 500 MG tablet Take 1 tablet (500 mg total) by mouth 3 (three) times daily. 30 tablet 0  . apixaban (ELIQUIS) 5 MG TABS tablet Take 5 mg by mouth 2 (two) times daily.    . blood glucose meter kit and supplies Dispense based on patient and insurance preference. Use four times daily as directed. (FOR ICD-10 E10.9, E11.9). 1 each 0  . Calcium Polycarbophil (FIBER-CAPS PO) Take 1 capsule by mouth in the morning and at bedtime.     . cephALEXin (KEFLEX) 250 MG capsule Take 1 capsule (250 mg total) by mouth at  bedtime. 30 capsule 11  . Cholecalciferol (VITAMIN D3) 1000 units CAPS Take 1,000 Units by mouth daily.     . clotrimazole-betamethasone (LOTRISONE) cream Apply 1 application topically 2 (two) times daily as needed (skin irriation.).     Marland Kitchen Cranberry 500 MG TABS Take 500 mg by mouth daily.     Marland Kitchen denosumab (XGEVA) 120 MG/1.7ML SOLN injection Inject 120 mg into the skin every 30 (thirty) days.    . diclofenac Sodium (VOLTAREN) 1 % GEL Apply 2 g topically 4 (four) times daily. 150 g 0  . diltiazem (CARDIZEM SR) 90 MG 12 hr capsule Take 1 capsule (90 mg total) by mouth 2 (two) times daily. 60 capsule 11  . donepezil (ARICEPT) 10 MG tablet Take 1 tablet (10 mg total) by mouth daily. 90 tablet 1  . feeding supplement, ENSURE ENLIVE, (ENSURE ENLIVE) LIQD Take 237 mLs by mouth every other day. CHOCOLATE     . fluticasone (FLONASE) 50 MCG/ACT nasal spray Place 2 sprays into both nostrils daily as needed (allergies.).     Marland Kitchen furosemide (LASIX) 20 MG tablet Take 20 mg by mouth daily as needed (retention/swelling.).     Marland Kitchen glipiZIDE (GLUCOTROL) 5 MG tablet Take 1 tablet (5 mg total) by mouth daily before breakfast. 90 tablet 1  . glucose blood (EASYMAX TEST) test strip Use as instructed 100 each 12  . Lancets 28G MISC 1 kit by Does not apply route every morning. 100 each 1  . Leuprolide Acetate, 6 Month, (LUPRON DEPOT, 70-MONTH,) 45 MG injection Inject 45 mg into the muscle every 6 (six) months.    Marland Kitchen lisinopril (ZESTRIL) 20 MG tablet Take 1 tablet (20 mg total) by mouth in the morning and at bedtime. 60 tablet 6  . metFORMIN (GLUCOPHAGE) 850 MG tablet Take 850 mg by mouth 2 (two) times daily with a meal.     . mirtazapine (REMERON) 15 MG tablet Take 1 tablet (15 mg total) by mouth at bedtime. 30 tablet 0  . Multiple Vitamin (MULTIVITAMIN WITH MINERALS) TABS tablet Take 1 tablet by mouth daily.     . nitroGLYCERIN (NITRODUR - DOSED IN MG/24 HR) 0.2 mg/hr patch Place 1 patch (0.2 mg total) onto the skin daily. 30  patch  12  . predniSONE (DELTASONE) 2.5 MG tablet Take 1 tablet (2.5 mg total) by mouth 2 (two) times daily with a meal. 90 tablet 2  . Probiotic Product (PROBIOTIC DAILY PO) Take 1 tablet by mouth daily. Every morning    . psyllium (METAMUCIL) 58.6 % packet Take 1 packet by mouth daily.    . traMADol (ULTRAM) 50 MG tablet Take 1 tablet (50 mg total) by mouth every 6 (six) hours as needed for moderate pain. 30 tablet 0  . trimethoprim (TRIMPEX) 100 MG tablet Take 1 tablet (100 mg total) by mouth at bedtime. 90 tablet 3  . clopidogrel (PLAVIX) 75 MG tablet Take 1 tablet (75 mg total) by mouth daily. 30 tablet 11  . SPS 15 GM/60ML suspension     . trimethoprim (TRIMPEX) 100 MG tablet Take 1 tablet (100 mg total) by mouth daily. 30 tablet 2   No current facility-administered medications for this visit.    REVIEW OF SYSTEMS:  _0  denotes positive finding, _1  denotes negative finding Cardiac  Comments:  Chest pain or chest pressure:    Shortness of breath upon exertion:    Short of breath when lying flat:    Irregular heart rhythm:        Vascular    Pain in calf, thigh, or hip brought on by ambulation:    Pain in feet at night that wakes you up from your sleep:     Blood clot in your veins:    Leg swelling:         Pulmonary    Oxygen at home:    Productive cough:     Wheezing:         Neurologic    Sudden weakness in arms or legs:     Sudden numbness in arms or legs:     Sudden onset of difficulty speaking or slurred speech:    Temporary loss of vision in one eye:     Problems with dizziness:         Gastrointestinal    Blood in stool:     Vomited blood:         Genitourinary    Burning when urinating:     Blood in urine:        Psychiatric    Major depression:         Hematologic    Bleeding problems:    Problems with blood clotting too easily:        Skin    Rashes or ulcers:        Constitutional    Fever or chills:      PHYSICAL EXAM: Vitals:   01/16/20  1521  BP: (!) 150/79  Pulse: 70  Resp: 16  Temp: (!) 97.2 F (36.2 C)  TempSrc: Temporal  SpO2: 100%  Weight: 140 lb (63.5 kg)  Height: _2  (1.778 m)    GENERAL: The patient is a well-nourished male, in no acute distress. The vital signs are documented above. CARDIAC: There is a regular rate and rhythm.  VASCULAR:  Right heel pictured below Right great toe ulcer pictured below Right DP/PR/peroneal signal brisk by doppler             DATA:   None  Assessment/Plan:  86 year old male who presented with critical limb ischemia of the right lower extremity with a nonhealing wound to the great toe.  Ultimately he had severe tibial disease and dominant runoff was through the anterior tibial artery that  was patent down to the ankle but had very poor outflow due to small vessel disease.  Ultimately underwent posterior tibial intervention for severely calcified artery with chronic total occlusion distally and likely had some atheroembolism to the heel.   The heel continues to have a nice dry eschar as pictured above and no signs of infection.  The right great toe ulcer also appears stable.  Appreciate Dr. Janalyn Rouse assistance with the wound clinic and he will continue visiting on a monthly basis.  I refilled tramadol for patient and according to his daughter he is only taking one fourth of a tablet at night and pain overall much better.  Discussed I have very low expectations for the toe ulcer to heal at this point but as long as this is stable along with the heel ulcer continue conservative management.  Both the patient's daughter and myself agree that no further intervention in the future is warranted given the complexity of his hospital course during last intervention and overall frail status.  Marty Heck, MD [facility] Office: [place]

## 2020-01-16 NOTE — Telephone Encounter (Signed)
Rx has already been sent

## 2020-01-18 DIAGNOSIS — E1142 Type 2 diabetes mellitus with diabetic polyneuropathy: Secondary | ICD-10-CM | POA: Diagnosis not present

## 2020-01-18 DIAGNOSIS — E1151 Type 2 diabetes mellitus with diabetic peripheral angiopathy without gangrene: Secondary | ICD-10-CM | POA: Diagnosis not present

## 2020-01-18 DIAGNOSIS — L97514 Non-pressure chronic ulcer of other part of right foot with necrosis of bone: Secondary | ICD-10-CM | POA: Diagnosis not present

## 2020-01-18 DIAGNOSIS — E11621 Type 2 diabetes mellitus with foot ulcer: Secondary | ICD-10-CM | POA: Diagnosis not present

## 2020-01-18 DIAGNOSIS — C61 Malignant neoplasm of prostate: Secondary | ICD-10-CM | POA: Diagnosis not present

## 2020-01-18 DIAGNOSIS — C8581 Other specified types of non-Hodgkin lymphoma, lymph nodes of head, face, and neck: Secondary | ICD-10-CM | POA: Diagnosis not present

## 2020-01-22 ENCOUNTER — Encounter (HOSPITAL_BASED_OUTPATIENT_CLINIC_OR_DEPARTMENT_OTHER): Payer: Medicare Other | Admitting: Internal Medicine

## 2020-01-24 ENCOUNTER — Other Ambulatory Visit (HOSPITAL_COMMUNITY): Payer: Self-pay

## 2020-01-24 DIAGNOSIS — C61 Malignant neoplasm of prostate: Secondary | ICD-10-CM | POA: Diagnosis not present

## 2020-01-24 DIAGNOSIS — E1151 Type 2 diabetes mellitus with diabetic peripheral angiopathy without gangrene: Secondary | ICD-10-CM | POA: Diagnosis not present

## 2020-01-24 DIAGNOSIS — C49A0 Gastrointestinal stromal tumor, unspecified site: Secondary | ICD-10-CM

## 2020-01-24 DIAGNOSIS — D472 Monoclonal gammopathy: Secondary | ICD-10-CM

## 2020-01-24 DIAGNOSIS — C7951 Secondary malignant neoplasm of bone: Secondary | ICD-10-CM

## 2020-01-24 DIAGNOSIS — L97514 Non-pressure chronic ulcer of other part of right foot with necrosis of bone: Secondary | ICD-10-CM | POA: Diagnosis not present

## 2020-01-24 DIAGNOSIS — E1142 Type 2 diabetes mellitus with diabetic polyneuropathy: Secondary | ICD-10-CM | POA: Diagnosis not present

## 2020-01-24 DIAGNOSIS — C8590 Non-Hodgkin lymphoma, unspecified, unspecified site: Secondary | ICD-10-CM

## 2020-01-24 DIAGNOSIS — E11621 Type 2 diabetes mellitus with foot ulcer: Secondary | ICD-10-CM | POA: Diagnosis not present

## 2020-01-24 DIAGNOSIS — C8581 Other specified types of non-Hodgkin lymphoma, lymph nodes of head, face, and neck: Secondary | ICD-10-CM | POA: Diagnosis not present

## 2020-01-25 ENCOUNTER — Inpatient Hospital Stay (HOSPITAL_COMMUNITY): Payer: Medicare Other

## 2020-01-26 ENCOUNTER — Inpatient Hospital Stay (HOSPITAL_COMMUNITY): Payer: Medicare Other

## 2020-01-26 ENCOUNTER — Other Ambulatory Visit (HOSPITAL_COMMUNITY): Payer: Self-pay | Admitting: *Deleted

## 2020-01-26 ENCOUNTER — Encounter (HOSPITAL_COMMUNITY): Payer: Self-pay

## 2020-01-26 ENCOUNTER — Inpatient Hospital Stay (HOSPITAL_COMMUNITY): Payer: Medicare Other | Attending: Hematology

## 2020-01-26 ENCOUNTER — Telehealth: Payer: Self-pay | Admitting: *Deleted

## 2020-01-26 ENCOUNTER — Other Ambulatory Visit: Payer: Self-pay

## 2020-01-26 VITALS — BP 140/78 | HR 75 | Temp 96.8°F | Resp 20

## 2020-01-26 DIAGNOSIS — I10 Essential (primary) hypertension: Secondary | ICD-10-CM | POA: Diagnosis not present

## 2020-01-26 DIAGNOSIS — C61 Malignant neoplasm of prostate: Secondary | ICD-10-CM

## 2020-01-26 DIAGNOSIS — E119 Type 2 diabetes mellitus without complications: Secondary | ICD-10-CM | POA: Insufficient documentation

## 2020-01-26 DIAGNOSIS — K219 Gastro-esophageal reflux disease without esophagitis: Secondary | ICD-10-CM | POA: Insufficient documentation

## 2020-01-26 DIAGNOSIS — R5383 Other fatigue: Secondary | ICD-10-CM | POA: Diagnosis not present

## 2020-01-26 DIAGNOSIS — C7951 Secondary malignant neoplasm of bone: Secondary | ICD-10-CM | POA: Diagnosis not present

## 2020-01-26 DIAGNOSIS — Z7901 Long term (current) use of anticoagulants: Secondary | ICD-10-CM | POA: Insufficient documentation

## 2020-01-26 DIAGNOSIS — R630 Anorexia: Secondary | ICD-10-CM | POA: Diagnosis not present

## 2020-01-26 DIAGNOSIS — D472 Monoclonal gammopathy: Secondary | ICD-10-CM

## 2020-01-26 DIAGNOSIS — C8590 Non-Hodgkin lymphoma, unspecified, unspecified site: Secondary | ICD-10-CM

## 2020-01-26 DIAGNOSIS — C49A0 Gastrointestinal stromal tumor, unspecified site: Secondary | ICD-10-CM

## 2020-01-26 DIAGNOSIS — Z7984 Long term (current) use of oral hypoglycemic drugs: Secondary | ICD-10-CM | POA: Diagnosis not present

## 2020-01-26 DIAGNOSIS — Z79899 Other long term (current) drug therapy: Secondary | ICD-10-CM | POA: Diagnosis not present

## 2020-01-26 DIAGNOSIS — Z79818 Long term (current) use of other agents affecting estrogen receptors and estrogen levels: Secondary | ICD-10-CM | POA: Diagnosis not present

## 2020-01-26 DIAGNOSIS — I4891 Unspecified atrial fibrillation: Secondary | ICD-10-CM | POA: Diagnosis not present

## 2020-01-26 LAB — CBC WITH DIFFERENTIAL/PLATELET
Abs Immature Granulocytes: 0.15 10*3/uL — ABNORMAL HIGH (ref 0.00–0.07)
Basophils Absolute: 0 10*3/uL (ref 0.0–0.1)
Basophils Relative: 0 %
Eosinophils Absolute: 0.2 10*3/uL (ref 0.0–0.5)
Eosinophils Relative: 2 %
HCT: 33.9 % — ABNORMAL LOW (ref 39.0–52.0)
Hemoglobin: 11.1 g/dL — ABNORMAL LOW (ref 13.0–17.0)
Immature Granulocytes: 2 %
Lymphocytes Relative: 26 %
Lymphs Abs: 2.2 10*3/uL (ref 0.7–4.0)
MCH: 29.6 pg (ref 26.0–34.0)
MCHC: 32.7 g/dL (ref 30.0–36.0)
MCV: 90.4 fL (ref 80.0–100.0)
Monocytes Absolute: 1.2 10*3/uL — ABNORMAL HIGH (ref 0.1–1.0)
Monocytes Relative: 14 %
Neutro Abs: 4.8 10*3/uL (ref 1.7–7.7)
Neutrophils Relative %: 56 %
Platelets: 179 10*3/uL (ref 150–400)
RBC: 3.75 MIL/uL — ABNORMAL LOW (ref 4.22–5.81)
RDW: 14.7 % (ref 11.5–15.5)
WBC: 8.5 10*3/uL (ref 4.0–10.5)
nRBC: 0 % (ref 0.0–0.2)

## 2020-01-26 LAB — COMPREHENSIVE METABOLIC PANEL
ALT: 16 U/L (ref 0–44)
AST: 16 U/L (ref 15–41)
Albumin: 3.6 g/dL (ref 3.5–5.0)
Alkaline Phosphatase: 69 U/L (ref 38–126)
Anion gap: 9 (ref 5–15)
BUN: 18 mg/dL (ref 8–23)
CO2: 28 mmol/L (ref 22–32)
Calcium: 10 mg/dL (ref 8.9–10.3)
Chloride: 99 mmol/L (ref 98–111)
Creatinine, Ser: 0.89 mg/dL (ref 0.61–1.24)
GFR calc non Af Amer: >60 mL/min (ref >60)
Glucose, Bld: 186 mg/dL — ABNORMAL HIGH (ref 70–99)
Potassium: 4.5 mmol/L (ref 3.5–5.1)
Sodium: 136 mmol/L (ref 135–145)
Total Bilirubin: 0.8 mg/dL (ref 0.3–1.2)
Total Protein: 6.8 g/dL (ref 6.5–8.1)

## 2020-01-26 LAB — PSA: Prostatic Specific Antigen: <0.01 ng/mL (ref 0.00–4.00)

## 2020-01-26 LAB — LACTATE DEHYDROGENASE: LDH: 105 U/L (ref 98–192)

## 2020-01-26 MED ORDER — ABIRATERONE ACETATE 250 MG PO TABS
250.0000 mg | ORAL_TABLET | Freq: Every day | ORAL | 0 refills | Status: DC
Start: 2020-01-26 — End: 2020-05-01

## 2020-01-26 MED ORDER — DENOSUMAB 120 MG/1.7ML ~~LOC~~ SOLN
120.0000 mg | Freq: Once | SUBCUTANEOUS | Status: AC
  Administered 2020-01-26: 120 mg via SUBCUTANEOUS

## 2020-01-26 NOTE — Telephone Encounter (Signed)
Chart reviewed, zytiga refilled per last office note.

## 2020-01-26 NOTE — Progress Notes (Signed)
[name] presents today for injection per the provider's orders.  X-geva administration without incident; injection site WNL; see MAR for injection details.  Patient tolerated procedure well and without incident.  No questions or complaints noted at this time.  Calcium level is 10.

## 2020-01-26 NOTE — Telephone Encounter (Signed)
Pt and daughter stopped by to notify office that he has been getting Eliquis from our office through [facility] and [facility]. Pt's daughter unsure why this is. Number for [facility] given to nurse. Called [facility] who states they are the pharmacy for [facility]. They only have the pt's home address for delivery. Will call and notify pt and daughter

## 2020-01-26 NOTE — Patient Instructions (Signed)
[facility] at [facility]  Discharge Instructions:   _______________________________________________________________  Thank you for choosing [facility] at [facility] to provide your oncology and hematology care.  To afford each patient quality time with our providers, please arrive at least 15 minutes before your scheduled appointment.  You need to re-schedule your appointment if you arrive 10 or more minutes late.  We strive to give you quality time with our providers, and arriving late affects you and other patients whose appointments are after yours.  Also, if you no show three or more times for appointments you may be dismissed from the clinic.  Again, thank you for choosing [facility] at [facility]. Our hope is that these requests will allow you access to exceptional care and in a timely manner. _______________________________________________________________  If you have questions after your visit, please contact our office at [phone] between the hours of 8:30 a.m. and 5:00 p.m. Voicemails left after 4:30 p.m. will not be returned until the following business day. _______________________________________________________________  For prescription refill requests, have your pharmacy contact our office. _______________________________________________________________  Recommendations made by the consultant and any test results will be sent to your referring physician. _______________________________________________________________ 

## 2020-01-29 ENCOUNTER — Inpatient Hospital Stay (HOSPITAL_BASED_OUTPATIENT_CLINIC_OR_DEPARTMENT_OTHER): Payer: Medicare Other | Admitting: Hematology

## 2020-01-29 ENCOUNTER — Encounter (HOSPITAL_COMMUNITY): Payer: Self-pay | Admitting: Hematology

## 2020-01-29 ENCOUNTER — Other Ambulatory Visit: Payer: Self-pay

## 2020-01-29 VITALS — BP 161/88 | HR 61 | Temp 96.8°F | Resp 17 | Wt 140.0 lb

## 2020-01-29 DIAGNOSIS — C7951 Secondary malignant neoplasm of bone: Secondary | ICD-10-CM | POA: Diagnosis not present

## 2020-01-29 DIAGNOSIS — R5383 Other fatigue: Secondary | ICD-10-CM | POA: Diagnosis not present

## 2020-01-29 DIAGNOSIS — I4891 Unspecified atrial fibrillation: Secondary | ICD-10-CM | POA: Diagnosis not present

## 2020-01-29 DIAGNOSIS — C61 Malignant neoplasm of prostate: Secondary | ICD-10-CM | POA: Diagnosis not present

## 2020-01-29 DIAGNOSIS — Z79818 Long term (current) use of other agents affecting estrogen receptors and estrogen levels: Secondary | ICD-10-CM | POA: Diagnosis not present

## 2020-01-29 DIAGNOSIS — R630 Anorexia: Secondary | ICD-10-CM | POA: Diagnosis not present

## 2020-01-29 NOTE — Patient Instructions (Signed)
[place] at [facility] Discharge Instructions  You were seen today by Dr. Delton Coombes. He went over your recent results. Continue receiving your monthly bone injections. Dr. Delton Coombes will see you back in 3 months for labs and follow up.   Thank you for choosing [place] at [facility] to provide your oncology and hematology care.  To afford each patient quality time with our provider, please arrive at least 15 minutes before your scheduled appointment time.   If you have a lab appointment with the [facility] please come in thru the Main Entrance and check in at the main information desk  You need to re-schedule your appointment should you arrive 10 or more minutes late.  We strive to give you quality time with our providers, and arriving late affects you and other patients whose appointments are after yours.  Also, if you no show three or more times for appointments you may be dismissed from the clinic at the providers discretion.     Again, thank you for choosing [facility].  Our hope is that these requests will decrease the amount of time that you wait before being seen by our physicians.       _____________________________________________________________  Should you have questions after your visit to [facility], please contact our office at [phone] between the hours of 8:00 a.m. and 4:30 p.m.  Voicemails left after 4:00 p.m. will not be returned until the following business day.  For prescription refill requests, have your pharmacy contact our office and allow 72 hours.    Cancer Center Support Programs:   > Cancer Support Group  2nd Tuesday of the month 1pm-2pm, Journey Room

## 2020-01-29 NOTE — Progress Notes (Signed)
[place], [place] [address]   CLINIC:  Medical Oncology/Hematology  PCP:  [name], NP [place]. 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  The Multi-Gene Panel offered by [facility] includes sequencing and/or deletion duplication testing of the following 80 genes: ALK, APC, ATM, AXIN2,BAP1,  BARD1, BLM, BMPR1A, BRCA1, BRCA2, BRIP1, CASR, CDC73, CDH1, CDK4, CDKN1B, CDKN1C, CDKN2A (p14ARF), CDKN2A (p16INK4a), CEBPA, CHEK2, CTNNA1, DICER1, DIS3L2, EGFR (c.2369C>T, p.Thr790Met variant only), EPCAM (Deletion/duplication testing only), FH, FLCN, GATA2, GPC3, GREM1 (Promoter region deletion/duplication testing only), HOXB13 (c.251G>A, p.Gly84Glu), HRAS, KIT, MAX, MEN1, MET, MITF (c.952G>A, p.Glu318Lys variant only), MLH1, MSH2, MSH3, MSH6, MUTYH, NBN, NF1, NF2, NTHL1, PALB2, PDGFRA, PHOX2B, PMS2, POLD1, POLE, POT1, PRKAR1A, PTCH1, PTEN, RAD50, RAD51C, RAD51D, RB1, RECQL4, RET, RUNX1, SDHAF2, SDHA (sequence changes only), SDHB, SDHC, SDHD, SMAD4, SMARCA4, SMARCB1, SMARCE1, STK11, SUFU, TERT, TERT, TMEM127, TP53, TSC1, TSC2, VHL, WRN and WT1.  The report date is February 08, 2017.    Prostate cancer ([facility])  09/08/2013 Initial Diagnosis   Prostate cancer ([facility])   02/08/2017 Genetic Testing   ATM Gain (Exons 62-63) VUS identified on the multi-gene panel.  The Multi-Gene Panel offered by [facility] includes sequencing and/or deletion duplication testing of the following 80 genes: ALK, APC, ATM,  AXIN2,BAP1,  BARD1, BLM, BMPR1A, BRCA1, BRCA2, BRIP1, CASR, CDC73, CDH1, CDK4, CDKN1B, CDKN1C, CDKN2A (p14ARF), CDKN2A (p16INK4a), CEBPA, CHEK2, CTNNA1, DICER1, DIS3L2, EGFR (c.2369C>T, p.Thr790Met variant only), EPCAM (Deletion/duplication testing only), FH, FLCN, GATA2, GPC3, GREM1 (Promoter region deletion/duplication testing only), HOXB13 (c.251G>A, p.Gly84Glu), HRAS, KIT, MAX, MEN1, MET, MITF (c.952G>A, p.Glu318Lys variant only), MLH1, MSH2, MSH3, MSH6, MUTYH, NBN, NF1, NF2, NTHL1, PALB2, PDGFRA, PHOX2B, PMS2, POLD1, POLE, POT1, PRKAR1A, PTCH1, PTEN, RAD50, RAD51C, RAD51D, RB1, RECQL4, RET, RUNX1, SDHAF2, SDHA (sequence changes only), SDHB, SDHC, SDHD, SMAD4, SMARCA4, SMARCB1, SMARCE1, STK11, SUFU, TERT, TERT, TMEM127, TP53, TSC1, TSC2, VHL, WRN and WT1.  The report date is February 08, 2017.      CANCER STAGING: Cancer Staging NHL (non-Hodgkin's lymphoma) (HCC) Staging form: Lymphoid Neoplasms, AJCC 6th Edition - Clinical: Stage III - Signed by Baird Cancer, PA on 08/18/2011   INTERVAL HISTORY:  [name], a 86 y.o. male, returns for routine follow-up of his metastatic prostate cancer to bones. [name] was last seen on 11/29/2019.  Today he is accompanied by his daughter. He reports feeling well. He is tolerating the bone shots well. He takes Zytiga 1 tablet daily and prednisone 2.5 mg BID. His appetite is the best it has been in the past year. He has not had any falls. The wound on the back of his right foot is still draining and slowly enlarging.   REVIEW OF SYSTEMS:  Review of Systems  Constitutional: Positive for appetite change (75%) and fatigue (50%).  HENT:   Positive for trouble swallowing.   Neurological: Positive for dizziness.  All other systems reviewed and are negative.   PAST MEDICAL/SURGICAL HISTORY:  Past Medical History:  Diagnosis Date  . Acute bronchitis   . Acute lower UTI 11/25/2017  .  Amputation of toe of left foot ([facility]) 07/30/2017, 08/27/2017     Great toe 07/30/2017 Second toe 08/27/2017   . Arthritis   . Aspiration pneumonia ([facility]) 01/31/2018  . Atrial fibrillation ([place])    Dr. Harl Bowie- [facility] follows saw 11'14  . Bladder stones    tx. with oral meds and antibiotics, now surgery planned  . Bone metastases ([facility]) 08/21/2015  . Cataracts, both eyes    surgery planned May 2015  . Community acquired pneumonia 04/16/2018  . Cystoid macular edema 04/21/2015    Right eye 04/21/2015   . Dehydration 04/16/2018  . Dementia without behavioral disturbance ([facility]) 04/16/2018  . Diabetes mellitus without complication ([facility])    Type II  . Diarrhea 11/25/2017  . Dyspnea    with activity  . Dysrhythmia    Afib  . Family history of breast cancer   . Family history of colon cancer   . Genetic testing 02/09/2017  . GERD (gastroesophageal reflux disease)   . GIST (gastrointestinal stromal tumor), malignant ([facility]) 2005   Gastrointestinal stromal tumor, that is GIST, small bowel, 4.5 cm, intermediate prognostic grade found on the PET scan in the small bowel, accounting for that small bowel activity in October 2005 with resection by Dr. Margot Chimes, thus far without recurrence.   Marland Kitchen History of MRI of lumbar spine 03/05/2014  . Hypertension   . Hypertrophy of prostate with urinary obstruction   . Hypomagnesemia 03/16/2017  . Metabolic encephalopathy 42/59/5638  . Metastatic adenocarcinoma to prostate ([facility]) 09/08/2013  . 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 NHL (non-Hodgkin's lymphoma) ([facility]) 2005   Diffuse large B-cell lymphoma, clinically stage IIIA, CD20 positive, status post cervical lymph node biopsy 08/03/2003 on the left. PET scan was also positive in the spleen and small bowel region, but bone marrow aspiration and biopsy were negative. So he essentially had stage IIIAs. He received R-CHOP x6 cycles with CR established by PET scan criteria on 11/23/2003 with no evidence for relapse th  . Numbness 02/19/2017   Per [facility] new patient  packet  . Osteomyelitis ([facility]) 06/29/2017   toe of left foot   . PAD (peripheral artery disease) ([facility]) 08/27/2017  . Polyneuropathy 02/19/2017  . Postural dizziness with presyncope 02/19/2017  . S/P TURP 08/10/2013  . Sepsis ([facility]) 07/02/2018  . Skin cancer    Basal cell- face,head, neck,  arms, legs, Back  . Urinary frequency 02/19/2017   Past Surgical History:  Procedure Laterality Date  . ABDOMINAL AORTOGRAM W/LOWER EXTREMITY N/A 09/20/2019   Procedure: ABDOMINAL AORTOGRAM W/LOWER EXTREMITY;  Surgeon: Marty Heck, MD;  Location: [facility];  Service: Cardiovascular;  Laterality: N/A;  . AMPUTATION Left 06/29/2017   Procedure: AMPUTATION LEFT GREAT TOE;  Surgeon: [name], MD;  Location: [facility];  Service: Vascular;  Laterality: Left;  . AMPUTATION Left 08/27/2017   Procedure: AMPUTATION Left SECOND TOE;  Surgeon: [name], MD;  Location: [facility];  Service: Vascular;  Laterality: Left;  . CATARACT EXTRACTION Bilateral 2015  . CHOLECYSTECTOMY    . COLON RESECTION     small bowel  . CYSTOSCOPY WITH LITHOLAPAXY N/A 08/10/2013   Procedure: CYSTOSCOPY WITH LITHOLAPAXY WITH [name];  Surgeon: [name], MD;  Location[facility];  Service: Urology;  Laterality: N/A;  . ESOPHAGEAL DILATION N/A 02/27/2016   Procedure: ESOPHAGEAL DILATION;  Surgeon: [name], MD;  [facility];  Service: Endoscopy;  Laterality: N/A;  . ESOPHAGEAL DILATION N/A 07/07/2018   Procedure:  ESOPHAGEAL DILATION;  Surgeon: [name], MD;  [facility];  Service: Endoscopy;  Laterality: N/A;  . ESOPHAGOGASTRODUODENOSCOPY N/A 02/27/2016   Procedure: ESOPHAGOGASTRODUODENOSCOPY (EGD);  Surgeon: [name], MD;  [facility];  Service: Endoscopy;  Laterality: N/A;  12:00  . ESOPHAGOGASTRODUODENOSCOPY N/A 07/07/2018   Procedure: ESOPHAGOGASTRODUODENOSCOPY (EGD);  Surgeon: [name], MD;  [facility];  Service: Endoscopy;   Laterality: N/A;  . INCISION AND DRAINAGE PERIRECTAL ABSCESS    . LOWER EXTREMITY ANGIOGRAPHY N/A 06/25/2017   Procedure: LOWER EXTREMITY ANGIOGRAPHY;  Surgeon: [name], MD;  Location: [facility];  Service: Cardiovascular;  Laterality: N/A;  . LYMPH NODE DISSECTION Left    '05-neck  . PERIPHERAL VASCULAR ATHERECTOMY Right 09/20/2019   Procedure: PERIPHERAL VASCULAR ATHERECTOMY;  Surgeon: Marty Heck, MD;  Location: [facility];  Service: Cardiovascular;  Laterality: Right;  Posterior tibial  . PORT-A-CATH REMOVAL    . PORTACATH PLACEMENT     insertion and removal -last chemotherapy 10 yrs ago  . TRANSURETHRAL RESECTION OF PROSTATE N/A 08/10/2013   Procedure: TRANSURETHRAL RESECTION OF THE PROSTATE WITH GYRUS INSTRUMENTS;  Surgeon: [name], MD;  Location[facility];  Service: Urology;  Laterality: N/A;    SOCIAL HISTORY:  Social History   Socioeconomic History  . Marital status: Married    Spouse name: Not on file  . Number of children: Not on file  . Years of education: Not on file  . Highest education level: Not on file  Occupational History  . Occupation: retired    Fish farm manager: RETIRED    Comment: salesman  Tobacco Use  . Smoking status: Never Smoker  . Smokeless tobacco: Never Used  Vaping Use  . Vaping Use: Never used  Substance and Sexual Activity  . Alcohol use: No  . Drug use: No  . Sexual activity: Not Currently  Other Topics Concern  . Not on file  Social History Narrative   Diet: Soft foods      Do you drink/eat things with caffeine No      Marital Status: Marred   What year were you married? 1949      Do you live in a house, apartment, assisted living, condo, trailer, etc.? House      Is it one or more stories? One      How many persons live in your home? 2         Do you have any pets in your home?(please list) No      Highest level of education completed: 9th      Current or past profession: [employer] you exercise? No Type and how often:      Living Will? Yes      DNR form? NO  If not do you wish to discuss one? Yes      POA/HPOA forms? Yes      Difficulty bathing or dressing yourself? Yes      Difficulty preparing food or eating? Yes      Difficulty managing medications? Yes      Difficulty managing your finances? Yes      Difficulty affording your medications? No                     Social Determinants of Health   Financial Resource Strain:   . Difficulty of Paying Living Expenses: Not on file  Food Insecurity:   .  Worried About Charity fundraiser in the Last Year: Not on file  . Ran Out of Food in the Last Year: Not on file  Transportation Needs: No Transportation Needs  . Lack of Transportation (Medical): No  . Lack of Transportation (Non-Medical): No  Physical Activity:   . Days of Exercise per Week: Not on file  . Minutes of Exercise per Session: Not on file  Stress:   . Feeling of Stress : Not on file  Social Connections:   . Frequency of Communication with Friends and Family: Not on file  . Frequency of Social Gatherings with Friends and Family: Not on file  . Attends Religious Services: Not on file  . Active Member of Clubs or Organizations: Not on file  . Attends Archivist Meetings: Not on file  . Marital Status: Not on file  Intimate Partner Violence:   . Fear of Current or Ex-Partner: Not on file  . Emotionally Abused: Not on file  . Physically Abused: Not on file  . Sexually Abused: Not on file    FAMILY HISTORY:  Family History  Problem Relation Age of Onset  . Heart attack Mother        died in her 47s  . Other Father 22       pneumonia - died  . Breast cancer Sister        dx in her 38s-60s  . Colon cancer Brother        dx in his 26s  . Cancer Sister        TAH/BSO due to "male cancer"  . Breast cancer Daughter 37  . Breast cancer Daughter 61       DCIS - ATM VUS on [facility] 83 gene panel in 2018  . Breast cancer  Daughter 56       DCIS - Negative on [facility] MyRisk 25 gene apnel in 2015  . Thyroid cancer Daughter 49       Medullary and Papillary  . Thyroid nodules Grandchild     CURRENT MEDICATIONS:  Current Outpatient Medications  Medication Sig Dispense Refill  . abiraterone acetate (ZYTIGA) 250 MG tablet Take 1 tablet (250 mg total) by mouth daily before breakfast. Take on an empty stomach 1 hour before or 2 hours after a meal 120 tablet 0  . acetaminophen (TYLENOL) 500 MG tablet Take 1 tablet (500 mg total) by mouth 3 (three) times daily. 30 tablet 0  . apixaban (ELIQUIS) 5 MG TABS tablet Take 5 mg by mouth 2 (two) times daily.    . Aspirin 81 MG CAPS 81 mg daily.     . blood glucose meter kit and supplies Dispense based on patient and insurance preference. Use four times daily as directed. (FOR ICD-10 E10.9, E11.9). 1 each 0  . Calcium Polycarbophil (FIBER-CAPS PO) Take 1 capsule by mouth in the morning and at bedtime.     . cephALEXin (KEFLEX) 250 MG capsule Take 1 capsule (250 mg total) by mouth at bedtime. 30 capsule 11  . Cholecalciferol (VITAMIN D3) 1000 units CAPS Take 1,000 Units by mouth daily.     . clotrimazole-betamethasone (LOTRISONE) cream Apply 1 application topically 2 (two) times daily as needed (skin irriation.).     Marland Kitchen Cranberry 500 MG TABS Take 500 mg by mouth daily.     Marland Kitchen denosumab (XGEVA) 120 MG/1.7ML SOLN injection Inject 120 mg into the skin every 30 (thirty) days.    . diclofenac Sodium (VOLTAREN) 1 % GEL  Apply 2 g topically 4 (four) times daily. 150 g 0  . diltiazem (CARDIZEM SR) 90 MG 12 hr capsule Take 1 capsule (90 mg total) by mouth 2 (two) times daily. 60 capsule 11  . donepezil (ARICEPT) 10 MG tablet Take 1 tablet (10 mg total) by mouth daily. 90 tablet 1  . feeding supplement, ENSURE ENLIVE, (ENSURE ENLIVE) LIQD Take 237 mLs by mouth every other day. CHOCOLATE     . fluticasone (FLONASE) 50 MCG/ACT nasal spray Place 2 sprays into both nostrils daily as needed  (allergies.).     Marland Kitchen furosemide (LASIX) 20 MG tablet Take 20 mg by mouth daily as needed (retention/swelling.).     Marland Kitchen glipiZIDE (GLUCOTROL) 5 MG tablet Take 1 tablet (5 mg total) by mouth daily before breakfast. 90 tablet 1  . glucose blood (EASYMAX TEST) test strip Use as instructed 100 each 12  . Lancets 28G MISC 1 kit by Does not apply route every morning. 100 each 1  . Leuprolide Acetate, 6 Month, (LUPRON DEPOT, 30-MONTH,) 45 MG injection Inject 45 mg into the muscle every 6 (six) months.    Marland Kitchen lisinopril (ZESTRIL) 20 MG tablet Take 1 tablet (20 mg total) by mouth in the morning and at bedtime. 60 tablet 6  . metFORMIN (GLUCOPHAGE) 850 MG tablet Take 850 mg by mouth 2 (two) times daily with a meal.     . mirtazapine (REMERON) 15 MG tablet Take 1 tablet (15 mg total) by mouth at bedtime. 30 tablet 0  . Multiple Vitamin (MULTIVITAMIN WITH MINERALS) TABS tablet Take 1 tablet by mouth daily.     . nitroGLYCERIN (NITRODUR - DOSED IN MG/24 HR) 0.2 mg/hr patch Place 1 patch (0.2 mg total) onto the skin daily. 30 patch 12  . predniSONE (DELTASONE) 2.5 MG tablet Take 1 tablet (2.5 mg total) by mouth 2 (two) times daily with a meal. 90 tablet 2  . Probiotic Product (PROBIOTIC DAILY PO) Take 1 tablet by mouth daily. Every morning    . psyllium (METAMUCIL) 58.6 % packet Take 1 packet by mouth daily.    . traMADol (ULTRAM) 50 MG tablet Take 1 tablet (50 mg total) by mouth every 6 (six) hours as needed for moderate pain. 30 tablet 0  . trimethoprim (TRIMPEX) 100 MG tablet Take 1 tablet (100 mg total) by mouth at bedtime. 90 tablet 3   No current facility-administered medications for this visit.    ALLERGIES:  Allergies  Allergen Reactions  . Tape Other (See Comments)    SKIN IS VERY THIN AND WILL TEAR AND BRUISE VERY EASILY!!!!  . Augmentin [Amoxicillin-Pot Clavulanate] Rash and Other (See Comments)    Redness of skin    PHYSICAL EXAM:  Performance status (ECOG): 1 - Symptomatic but completely  ambulatory  Vitals:   01/29/20 1128  BP: (!) 161/88  Pulse: 61  Resp: 17  Temp: (!) 96.8 F (36 C)  SpO2: 100%   Wt Readings from Last 3 Encounters:  01/29/20 140 lb (63.5 kg)  01/16/20 140 lb (63.5 kg)  12/15/19 145 lb (65.8 kg)   Physical Exam Vitals reviewed.  Constitutional:      Appearance: Normal appearance.  Cardiovascular:     Rate and Rhythm: Normal rate and regular rhythm.     Pulses: Normal pulses.     Heart sounds: Murmur (systolic murmur) heard.   Pulmonary:     Effort: Pulmonary effort is normal.     Breath sounds: Normal breath sounds.  Musculoskeletal:  Right elbow: Normal range of motion. No tenderness.  Neurological:     General: No focal deficit present.     Mental Status: He is alert and oriented to person, place, and time.  Psychiatric:        Mood and Affect: Mood normal.        Behavior: Behavior normal.      LABORATORY DATA:  I have reviewed the labs as listed.  CBC Latest Ref Rng & Units 01/26/2020 12/28/2019 11/27/2019  WBC 4.0 - 10.5 K/uL 8.5 8.1 7.3  Hemoglobin 13.0 - 17.0 g/dL 11.1(L) 11.4(L) 10.6(L)  Hematocrit 39 - 52 % 33.9(L) 35.0(L) 33.6(L)  Platelets 150 - 400 K/uL 179 178 159   CMP Latest Ref Rng & Units 01/26/2020 12/28/2019 11/27/2019  Glucose 70 - 99 mg/dL 186(H) 184(H) 162(H)  BUN 8 - 23 mg/dL 18 25(H) 14  Creatinine 0.61 - 1.24 mg/dL 0.89 0.82 0.79  Sodium 135 - 145 mmol/L 136 138 135  Potassium 3.5 - 5.1 mmol/L 4.5 4.4 4.2  Chloride 98 - 111 mmol/L 99 101 103  CO2 22 - 32 mmol/L _0 Calcium 8.9 - 10.3 mg/dL 10.0 10.6(H) 9.4  Total Protein 6.5 - 8.1 g/dL 6.8 6.6 6.5  Total Bilirubin 0.3 - 1.2 mg/dL 0.8 0.5 0.4  Alkaline Phos 38 - 126 U/L 69 60 53  AST 15 - 41 U/L 16 13(L) 13(L)  ALT 0 - 44 U/L _1 Lab Results  Component Value Date   PSA 0.01 09/22/2016   PSA <0.01 08/25/2016   PSA <0.01 07/28/2016    DIAGNOSTIC IMAGING:  I have independently reviewed the scans and discussed with the patient. No  results found.   ASSESSMENT:  1.  Metastatic prostate cancer to the bones: -Abiraterone and prednisone started in October 2016. -Bone scan on Aug 27, 2017 shows interval improvement in the T12 lesions. No new lesions. -Abiraterone dose reduced 100 mg daily and prednisone 2.5 mg daily on February 10, 2018. -Abiraterone was held for a month but his symptoms of abdominal and leg pains did not improve. His energy levels also did not improve. -He was started back on abiraterone 500 mg daily on August 09, 2019 along with prednisone. -Current dose of Zytiga is to 50 mg daily.  2. IgM MGUS: -SPEP on January 25, 2019 shows M spike 0.4 g. Light chain ratio is 0.91.   PLAN:  1.  Metastatic prostate cancer to the bones: -He is tolerating Zytiga to 50 mg daily very well.  He is taking prednisone 2.5 mg twice daily. -Labs reviewed from 01/26/2020 shows PSA undetectable.  Chemistries including LFTs are normal. -I have also talked to his daughter that in case his physical condition deteriorates, we could discontinue all active therapy including Zytiga, prednisone and Lupron. -I plan to see him back in 3 months for follow-up with repeat labs.  2.  Bone metastasis: -Calcium today is normal at 10.0.  He is not on any calcium supplements. -Continue denosumab monthly.   Orders placed this encounter:  No orders of the defined types were placed in this encounter.    [name], MD [place] [phone]   I, Milinda Antis, am acting as a scribe for Dr. Sanda Linger.  I, [name] MD, have reviewed the above documentation for accuracy and completeness, and I agree with the above.

## 2020-02-01 ENCOUNTER — Encounter (HOSPITAL_BASED_OUTPATIENT_CLINIC_OR_DEPARTMENT_OTHER): Payer: Medicare Other | Attending: Internal Medicine | Admitting: Internal Medicine

## 2020-02-01 DIAGNOSIS — Z89422 Acquired absence of other left toe(s): Secondary | ICD-10-CM | POA: Diagnosis not present

## 2020-02-01 DIAGNOSIS — E1142 Type 2 diabetes mellitus with diabetic polyneuropathy: Secondary | ICD-10-CM | POA: Insufficient documentation

## 2020-02-01 DIAGNOSIS — Z89412 Acquired absence of left great toe: Secondary | ICD-10-CM | POA: Insufficient documentation

## 2020-02-01 DIAGNOSIS — W1811XA Fall from or off toilet without subsequent striking against object, initial encounter: Secondary | ICD-10-CM | POA: Insufficient documentation

## 2020-02-01 DIAGNOSIS — Z8572 Personal history of non-Hodgkin lymphomas: Secondary | ICD-10-CM | POA: Insufficient documentation

## 2020-02-01 DIAGNOSIS — S40812A Abrasion of left upper arm, initial encounter: Secondary | ICD-10-CM | POA: Insufficient documentation

## 2020-02-01 DIAGNOSIS — S40811A Abrasion of right upper arm, initial encounter: Secondary | ICD-10-CM | POA: Diagnosis not present

## 2020-02-01 DIAGNOSIS — Z9221 Personal history of antineoplastic chemotherapy: Secondary | ICD-10-CM | POA: Diagnosis not present

## 2020-02-01 DIAGNOSIS — Z8546 Personal history of malignant neoplasm of prostate: Secondary | ICD-10-CM | POA: Diagnosis not present

## 2020-02-01 DIAGNOSIS — L97514 Non-pressure chronic ulcer of other part of right foot with necrosis of bone: Secondary | ICD-10-CM | POA: Insufficient documentation

## 2020-02-01 DIAGNOSIS — E11621 Type 2 diabetes mellitus with foot ulcer: Secondary | ICD-10-CM | POA: Insufficient documentation

## 2020-02-01 NOTE — Progress Notes (Signed)
[name], Foster ([id number]) Visit Report for 02/01/2020 Arrival Information Details Patient Name: Date of Service: [name] 02/01/2020 1:15 PM Medical Record Number: [id number] Patient Account Number: [id number] Date of Birth/Sex: Treating RN: 27-Apr-1923 (86 y.o. [name] Primary Care Marcea Rojek: Sherrie Mustache Other Clinician: Referring Andrell Bergeson: Treating Kurtiss Wence/Extender: Deloria Lair in Treatment: 27 Visit Information History Since Last Visit Added or deleted any medications: No Patient Arrived: Wheel Chair Any new allergies or adverse reactions: No Arrival Time: 13:14 Had a fall or experienced change in No Accompanied By: dgt activities of daily living that may affect Transfer Assistance: EasyPivot Patient Lift risk of falls: Patient Identification Verified: Yes Signs or symptoms of abuse/neglect since last visito No Secondary Verification Process Completed: Yes Hospitalized since last visit: No Patient Requires Transmission-Based Precautions: No Implantable device outside of the clinic excluding No Patient Has Alerts: Yes cellular tissue based products placed in the center Patient Alerts: R ABI non compressible since last visit: Has Dressing in Place as Prescribed: Yes Has Footwear/Offloading in Place as Prescribed: Yes Right: Other:heel offloading sandal Pain Present Now: No Electronic Signature(s) Signed: 02/01/2020 5:50:35 PM By: Baruch Gouty RN, BSN Entered By: Baruch Gouty on 02/01/2020 13:15:38 -------------------------------------------------------------------------------- Clinic Level of Care Assessment Details Patient Name: Date of Service: [place] [name] Fuchs E. 02/01/2020 1:15 PM Medical Record Number: [id number] Patient Account Number: [id number] Date of Birth/Sex: Treating RN: Jun 29, 1923 (86 y.o. [name] Primary Care Saloma Cadena: Sherrie Mustache Other Clinician: Referring Felix Meras: Treating  Zahrah Sutherlin/Extender: Deloria Lair in Treatment: 27 Clinic Level of Care Assessment Items TOOL 4 Quantity Score X- 1 0 Use when only an EandM is performed on FOLLOW-UP visit ASSESSMENTS - Nursing Assessment / Reassessment X- 1 10 Reassessment of Co-morbidities (includes updates in patient status) X- 1 5 Reassessment of Adherence to Treatment Plan ASSESSMENTS - Wound and Skin A ssessment / Reassessment []  - 0 Simple Wound Assessment / Reassessment - one wound X- 2 5 Complex Wound Assessment / Reassessment - multiple wounds X- 1 10 Dermatologic / Skin Assessment (not related to wound area) ASSESSMENTS - Focused Assessment X- 1 5 Circumferential Edema Measurements - multi extremities X- 1 10 Nutritional Assessment / Counseling / Intervention []  - 0 Lower Extremity Assessment (monofilament, tuning fork, pulses) []  - 0 Peripheral Arterial Disease Assessment (using hand held doppler) ASSESSMENTS - Ostomy and/or Continence Assessment and Care []  - 0 Incontinence Assessment and Management []  - 0 Ostomy Care Assessment and Management (repouching, etc.) PROCESS - Coordination of Care []  - 0 Simple Patient / Family Education for ongoing care X- 1 20 Complex (extensive) Patient / Family Education for ongoing care X- 1 10 Staff obtains Programmer, systems, Records, T Results / Process Orders est X- 1 10 Staff telephones HHA, Nursing Homes / Clarify orders / etc []  - 0 Routine Transfer to another Facility (non-emergent condition) []  - 0 Routine Hospital Admission (non-emergent condition) []  - 0 New Admissions / Biomedical engineer / Ordering NPWT Apligraf, etc. , []  - 0 Emergency Hospital Admission (emergent condition) []  - 0 Simple Discharge Coordination X- 1 15 Complex (extensive) Discharge Coordination PROCESS - Special Needs []  - 0 Pediatric / Minor Patient Management []  - 0 Isolation Patient Management []  - 0 Hearing / Language / Visual special  needs []  - 0 Assessment of Community assistance (transportation, D/C planning, etc.) []  - 0 Additional assistance / Altered mentation []  - 0 Support Surface(s) Assessment (bed, cushion, seat, etc.) INTERVENTIONS - Wound Cleansing /  Measurement []  - 0 Simple Wound Cleansing - one wound X- 2 5 Complex Wound Cleansing - multiple wounds X- 1 5 Wound Imaging (photographs - any number of wounds) []  - 0 Wound Tracing (instead of photographs) []  - 0 Simple Wound Measurement - one wound X- 2 5 Complex Wound Measurement - multiple wounds INTERVENTIONS - Wound Dressings X - Small Wound Dressing one or multiple wounds 1 10 X- 1 15 Medium Wound Dressing one or multiple wounds []  - 0 Large Wound Dressing one or multiple wounds []  - 0 Application of Medications - topical []  - 0 Application of Medications - injection INTERVENTIONS - Miscellaneous []  - 0 External ear exam []  - 0 Specimen Collection (cultures, biopsies, blood, body fluids, etc.) []  - 0 Specimen(s) / Culture(s) sent or taken to Lab for analysis []  - 0 Patient Transfer (multiple staff / Civil Service fast streamer / Similar devices) []  - 0 Simple Staple / Suture removal (25 or less) []  - 0 Complex Staple / Suture removal (26 or more) []  - 0 Hypo / Hyperglycemic Management (close monitor of Blood Glucose) []  - 0 Ankle / Brachial Index (ABI) - do not check if billed separately X- 1 5 Vital Signs Has the patient been seen at the hospital within the last three years: Yes Total Score: 160 Level Of Care: New/Established - Level 5 Electronic Signature(s) Signed: 02/01/2020 5:51:00 PM By: [name] Entered By: [name] on 02/01/2020 14:07:24 -------------------------------------------------------------------------------- Encounter Discharge Information Details Patient Name: Date of Service: [place] [name] Fuchs E. 02/01/2020 1:15 PM Medical Record Number: [id number] Patient Account Number: [id number] Date of Birth/Sex: Treating  RN: 1923/07/10 (86 y.o. [name] Primary Care [name]: Sherrie Mustache Other Clinician: Referring [name]: Treating [name]/Extender: Deloria Lair in Treatment: 27 Encounter Discharge Information Items Discharge Condition: Stable Ambulatory Status: Wheelchair Discharge Destination: Home Transportation: Private Auto Accompanied ByCharolett Bumpers, caregiver Schedule Follow-up Appointment: Yes Clinical Summary of Care: Electronic Signature(s) Signed: 02/01/2020 5:50:35 PM By: Baruch Gouty RN, BSN Entered By: Baruch Gouty on 02/01/2020 14:17:59 -------------------------------------------------------------------------------- Lower Extremity Assessment Details Patient Name: Date of Service: [place] [name] Fuchs E. 02/01/2020 1:15 PM Medical Record Number: [id number] Patient Account Number: [id number] Date of Birth/Sex: Treating RN: 05-06-1923 (86 y.o. [name] Primary Care Lalana Wachter: Sherrie Mustache Other Clinician: Referring Brennley Curtice: Treating Novella Abraha/Extender: Deloria Lair in Treatment: 27 Edema Assessment Assessed: Shirlyn Goltz: No] [Right: No] Edema: [Left: N] [Right: o] Calf Left: Right: Point of Measurement: From Medial Instep 29.5 cm Ankle Left: Right: Point of Measurement: From Medial Instep 23.5 cm Vascular Assessment Pulses: Dorsalis Pedis Palpable: [Right:Yes] Electronic Signature(s) Signed: 02/01/2020 5:50:35 PM By: Baruch Gouty RN, BSN Entered By: Baruch Gouty on 02/01/2020 13:29:08 -------------------------------------------------------------------------------- Multi Wound Chart Details Patient Name: Date of Service: [place] [name] Fuchs E. 02/01/2020 1:15 PM Medical Record Number: [id number] Patient Account Number: [id number] Date of Birth/Sex: Treating RN: September 29, 1923 (86 y.o. [name] Primary Care Cataleia Gade: Sherrie Mustache Other Clinician: Referring Brithany Whitworth: Treating  Taunia Frasco/Extender: Deloria Lair in Treatment: 27 Vital Signs Height(in): 70 Capillary Blood Glucose(mg/dl): 174 Weight(lbs): 140 Pulse(bpm): 75 Body Mass Index(BMI): 20 Blood Pressure(mmHg): 181/75 Temperature(F): 97.8 Respiratory Rate(breaths/min): 18 Photos: [2:No Photos Right T Great oe] [3:No Photos Right Calcaneus] [N/A:N/A N/A] Wound Location: [2:Gradually Appeared] [3:Gradually Appeared] [N/A:N/A] Wounding Event: [2:Diabetic Wound/Ulcer of the Lower] [3:Pressure Ulcer] [N/A:N/A] Primary Etiology: [2:Extremity N/A] [3:Arterial Insufficiency Ulcer] [N/A:N/A] Secondary Etiology: [2:Cataracts, Arrhythmia, Hypertension, Cataracts, Arrhythmia, Hypertension,] [N/A:N/A] Comorbid History: [2:Peripheral Arterial Disease, Type II  Peripheral Arterial Disease, Type II Diabetes, Osteomyelitis, Dementia, Diabetes, Osteomyelitis, Dementia, Neuropathy, Received Chemotherapy Neuropathy, Received Chemotherapy 06/15/2019]  [3:09/21/2019] [N/A:N/A] Date Acquired: [2:27] [3:15] [N/A:N/A] Weeks of Treatment: [2:Open] [3:Open] [N/A:N/A] Wound Status: [2:0.5x0.7x0.2] [3:4.6x6.3x0.1] [N/A:N/A] Measurements L x W x D (cm) [2:0.275] [3:22.761] [N/A:N/A] A (cm) : rea [2:0.055] [3:2.276] [N/A:N/A] Volume (cm) : [2:-192.60%] [3:46.90%] [N/A:N/A] % Reduction in A rea: [2:-96.40%] [3:46.90%] [N/A:N/A] % Reduction in Volume: [2:Grade 2] [3:Unstageable/Unclassified] [N/A:N/A] Classification: [2:Small] [3:Small] [N/A:N/A] Exudate A mount: [2:Purulent] [3:Serosanguineous] [N/A:N/A] Exudate Type: [2:yellow, brown, green] [3:red, brown] [N/A:N/A] Exudate Color: [2:Distinct, outline attached] [3:Flat and Intact] [N/A:N/A] Wound Margin: [2:Small (1-33%)] [3:None Present (0%)] [N/A:N/A] Granulation A mount: [2:Pink, Pale] [3:N/A] [N/A:N/A] Granulation Quality: [2:None Present (0%)] [3:Large (67-100%)] [N/A:N/A] Necrotic A mount: [2:N/A] [3:Eschar] [N/A:N/A] Necrotic Tissue: [2:Fat Layer  (Subcutaneous Tissue): Yes Fascia: No] [N/A:N/A] Exposed Structures: [2:Bone: Yes Fascia: No Tendon: No Muscle: No Joint: No Small (1-33%)] [3:Fat Layer (Subcutaneous Tissue): No Tendon: No Muscle: No Joint: No Bone: No Small (1-33%)] [N/A:N/A] Treatment Notes Electronic Signature(s) Signed: 02/01/2020 5:33:07 PM By: Linton Ham MD Signed: 02/01/2020 5:51:00 PM By: [name] Signed: 02/01/2020 5:51:00 PM By: [name] Entered By: Linton Ham on 02/01/2020 14:04:33 -------------------------------------------------------------------------------- Multi-Disciplinary Care Plan Details Patient Name: Date of Service: [place] [name] 02/01/2020 1:15 PM Medical Record Number: [id number] Patient Account Number: [id number] Date of Birth/Sex: Treating RN: 11/04/1923 (86 y.o. [name] Primary Care Arwyn Besaw: Sherrie Mustache Other Clinician: Referring Jasmyne Lodato: Treating Marl Seago/Extender: Deloria Lair in Treatment: 27 Active Inactive Wound/Skin Impairment Nursing Diagnoses: Knowledge deficit related to ulceration/compromised skin integrity Goals: Patient/caregiver will verbalize understanding of skin care regimen Date Initiated: 07/25/2019 Target Resolution Date: 02/16/2020 Goal Status: Active Ulcer/skin breakdown will have a volume reduction of 30% by week 4 Date Initiated: 07/25/2019 Date Inactivated: 08/28/2019 Target Resolution Date: 08/25/2019 Goal Status: Unmet Unmet Reason: Osteomyelitis Interventions: Assess patient/caregiver ability to obtain necessary supplies Assess patient/caregiver ability to perform ulcer/skin care regimen upon admission and as needed Assess ulceration(s) every visit Notes: Electronic Signature(s) Signed: 02/01/2020 5:51:00 PM By: [name] Entered By: [name] on 02/01/2020 13:19:27 -------------------------------------------------------------------------------- Pain Assessment Details Patient  Name: Date of Service: [name] 02/01/2020 1:15 PM Medical Record Number: [id number] Patient Account Number: [id number] Date of Birth/Sex: Treating RN: [birth year] (86 y.o. [name] Primary Care Trusten Hume: Sherrie Mustache Other Clinician: Referring Tyhesha Dutson: Treating Lenville Hibberd/Extender: Deloria Lair in Treatment: 27 Active Problems Location of Pain Severity and Description of Pain Patient Has Paino No Site Locations Rate the pain. Rate the pain. Current Pain Level: 0 Pain Management and Medication Current Pain Management: Electronic Signature(s) Signed: 02/01/2020 5:50:35 PM By: Baruch Gouty RN, BSN Entered By: Baruch Gouty on 02/01/2020 13:18:08 -------------------------------------------------------------------------------- Patient/Caregiver Education Details Patient Name: Date of Service: [name] 10/14/2021andnbsp1:15 PM Medical Record Number: [id number] Patient Account Number: [id number] Date of Birth/Gender: Treating RN: February 12, 1924 (86 y.o. [name] Primary Care Physician: Sherrie Mustache Other Clinician: Referring Physician: Treating Physician/Extender: Deloria Lair in Treatment: 27 Education Assessment Education Provided To: Patient Education Topics Provided Wound/Skin Impairment: Handouts: Skin Care Do's and Dont's Methods: Explain/Verbal Responses: Reinforcements needed Electronic Signature(s) Signed: 02/01/2020 5:51:00 PM By: [name] Entered By: [name] on 02/01/2020 13:19:39 -------------------------------------------------------------------------------- Wound Assessment Details Patient Name: Date of Service: [place] [name] Fuchs E. 02/01/2020 1:15 PM Medical Record Number: [id number] Patient Account Number: [id number] Date of Birth/Sex: Treating RN: 03/06/1924 (86 y.o. [name] Primary  Care Dalya Maselli: Sherrie Mustache Other  Clinician: Referring Hagar Sadiq: Treating Ardis Fullwood/Extender: Deloria Lair in Treatment: 27 Wound Status Wound Number: 2 Primary Diabetic Wound/Ulcer of the Lower Extremity Etiology: Wound Location: Right T Great oe Wound Open Wounding Event: Gradually Appeared Status: Date Acquired: 06/15/2019 Comorbid Cataracts, Arrhythmia, Hypertension, Peripheral Arterial Disease, Weeks Of Treatment: 27 History: Type II Diabetes, Osteomyelitis, Dementia, Neuropathy, Received Clustered Wound: No Chemotherapy Wound Measurements Length: (cm) 0.5 Width: (cm) 0.7 Depth: (cm) 0.2 Area: (cm) 0.275 Volume: (cm) 0.055 % Reduction in Area: -192.6% % Reduction in Volume: -96.4% Epithelialization: Small (1-33%) Tunneling: No Undermining: No Wound Description Classification: Grade 2 Wound Margin: Distinct, outline attached Exudate Amount: Small Exudate Type: Purulent Exudate Color: yellow, brown, green Foul Odor After Cleansing: No Slough/Fibrino No Wound Bed Granulation Amount: Small (1-33%) Exposed Structure Granulation Quality: Pink, Pale Fascia Exposed: No Necrotic Amount: None Present (0%) Fat Layer (Subcutaneous Tissue) Exposed: Yes Tendon Exposed: No Muscle Exposed: No Joint Exposed: No Bone Exposed: Yes Treatment Notes Wound #2 (Right Toe Great) 3. Primary Dressing Applied Colle[employer] 4. Secondary Dressing Dry Gauze Roll Gauze Notes heel offloading sandal Electronic Signature(s) Signed: 02/01/2020 5:50:35 PM By: Baruch Gouty RN, BSN Entered By: Baruch Gouty on 02/01/2020 13:30:37 -------------------------------------------------------------------------------- Wound Assessment Details Patient Name: Date of Service: [place] [name] Fuchs E. 02/01/2020 1:15 PM Medical Record Number: [id number] Patient Account Number: [id number] Date of Birth/Sex: Treating RN: April 18, 1924 (86 y.o. [name] Primary Care Dorothyann Mourer: Sherrie Mustache Other  Clinician: Referring Alexyia Guarino: Treating Brandey Vandalen/Extender: Deloria Lair in Treatment: 27 Wound Status Wound Number: 3 Primary Pressure Ulcer Etiology: Wound Location: Right Calcaneus Secondary Arterial Insufficiency Ulcer Wounding Event: Gradually Appeared Etiology: Date Acquired: 09/21/2019 Date Acquired: 09/21/2019 Wound Open Weeks Of Treatment: 15 Status: Clustered Wound: No Comorbid Cataracts, Arrhythmia, Hypertension, Peripheral Arterial Disease, History: Type II Diabetes, Osteomyelitis, Dementia, Neuropathy, Received Chemotherapy Wound Measurements Length: (cm) 4.6 Width: (cm) 6.3 Depth: (cm) 0.1 Area: (cm) 22.761 Volume: (cm) 2.276 % Reduction in Area: 46.9% % Reduction in Volume: 46.9% Epithelialization: Small (1-33%) Tunneling: No Undermining: No Wound Description Classification: Unstageable/Unclassified Wound Margin: Flat and Intact Exudate Amount: Small Exudate Type: Serosanguineous Exudate Color: red, brown Foul Odor After Cleansing: No Slough/Fibrino No Wound Bed Granulation Amount: None Present (0%) Exposed Structure Necrotic Amount: Large (67-100%) Fascia Exposed: No Necrotic Quality: Eschar Fat Layer (Subcutaneous Tissue) Exposed: No Tendon Exposed: No Muscle Exposed: No Joint Exposed: No Bone Exposed: No Treatment Notes Wound #3 (Right Calcaneus) 3. Primary Dressing Applied Other primary dressing (specifiy in notes) 4. Secondary Dressing Dry Gauze Roll Gauze Heel Cup Notes betadine to eschar on heel / heel offloading sandal Electronic Signature(s) Signed: 02/01/2020 5:50:35 PM By: Baruch Gouty RN, BSN Entered By: Baruch Gouty on 02/01/2020 13:30:55 -------------------------------------------------------------------------------- Vitals Details Patient Name: Date of Service: [place] [name] Fuchs E. 02/01/2020 1:15 PM Medical Record Number: [id number] Patient Account Number: [id number] Date of Birth/Sex:  Treating RN: Feb 06, 1924 (86 y.o. [name] Primary Care Zanasia Hickson: Sherrie Mustache Other Clinician: Referring Kelyse Pask: Treating Ivanell Deshotel/Extender: Deloria Lair in Treatment: 27 Vital Signs Time Taken: 13:17 Temperature (F): 97.8 Height (in): 70 Pulse (bpm): 75 Source: Stated Respiratory Rate (breaths/min): 18 Weight (lbs): 140 Blood Pressure (mmHg): 181/75 Source: Stated Capillary Blood Glucose (mg/dl): 174 Body Mass Index (BMI): 20.1 Reference Range: 80 - 120 mg / dl Notes glucose per pt report this am Electronic Signature(s) Signed: 02/01/2020 5:50:35 PM By: Baruch Gouty RN, BSN Entered By: Baruch Gouty on 02/01/2020 13:17:59

## 2020-02-01 NOTE — Progress Notes (Signed)
KALIB, BHAGAT ([id number]) Visit Report for 02/01/2020 HPI Details Patient Name: Date of Service: [name] 02/01/2020 1:15 PM Medical Record Number: [id number] Patient Account Number: [id number] Date of Birth/Sex: Treating RN: May 13, 1923 (86 y.o. [name] Primary Care Provider: Sherrie Mustache Other Clinician: Referring Provider: Treating Provider/Extender: Deloria Lair in Treatment: 27 History of Present Illness HPI Description: ADMISSION 05/02/2019 This is a 86 year old man accompanied by his daughter. He lives in [place] with his elderly wife. He has home caregivers as well. Apparently on February 28, 2019 they noted blood on his sock and noticed a wound on the tip of his right great toe. He has been cared for by Dr. Posey Pronto who is his podiatrist in [place]. Apparently they have been using Betadine to this area. The patient has a thick mycotic nail on the right as well which is embedded in the wound in some areas. Fortunately the patient has diabetic neuropathy and is not really experiencing any pain otherwise I think this would all be quite painful. The patient was previously treated in 2019 for wounds on the left foot. This included a fairly thorough vascular review by vein and vascular Dr. Oneida Alar. His ABIs at that time were noncompressible bilaterally TBI on the right was 0.88 with biphasic waveforms on the right and monophasic waveforms at the left. He ended up having amputations of the left first and second toes. He did have an angiogram on 06/25/2017 which showed on the right the right common femoral profundofemoral for Morris and superficial femoral arteries were widely patent the right popliteal was widely patent. Tibial vessels were not well opacified secondary to motion artifact however he was felt to have all 3 tibial vessels intact with some calcification and some areas of stenosis within the posterior tibial artery  but relatively good flow to the right lower leg. He also had the left reviewed as well which was the source of the problem at that time. Past medical history; prostate cancer on oral antiandrogen therapy with bone mets, GI stromal tumor in 2005, type 2 diabetes with peripheral neuropathy, hypertension, atrial fibrillation on Eliquis, history of non-Hodgkin's lymphoma treated with CHOP in 2005. He had amputations of the left first and second toes secondary to osteomyelitis. Does not appear that he has had an x-ray of the right foot ABI in our clinic on the right was noncompressible [see full arterial discussion above]. 1/19; area on the tip of the right great toe under a very mycotic nail tip. We use silver alginate. 1/26; area of the tip of the right great toe under a very mycotic nail. A lot of this seems to have closed down we have been using silver alginate 2/9; the areas on the tip of the right great toe under a very mycotic nail. A lot of this continues to close down. I do not think there is a subungual problem. I am hopeful that this mycotic nail which is thick and raised will allow full epithelialization of the wound 2/23; this was a difficult area on the tip of his right great toe. Thick mycotic nail over the base of the wound. This appears to have closed down now. The nail itself is thick split with considerable subungual debris. READMISSION 07/25/2019 This is a 86 year old man that we cared for earlier this year in the clinic without a wound on the tip of his right great toe under a very mycotic toenail. He is a type II diabetic. His  ABIs have been noncompressible however he has been felt to have adequate blood flow in the right to heal the wound. We were able to get this wound to close over as I understand things he followed with Dr. Caprice Beaver had some debridement of the nail everything looks fine and apparently the wound bed was washed off by his wife over the weekend and by Monday it was  open. His daughter is able to show me pictures. Unfortunately this now has exposed bone raising the possibility that he has had underlying osteomyelitis all along. He has been using Silvadene cream on this he has been referred back to the clinic by Dr. Caprice Beaver. He had x-rays of the right foot that did not show evidence of osteomyelitis in the right foot. He has not been on antibiotics He also had an x-ray of the left foot that showed findings suspicious for osteomyelitis involving the residual base of the first proximal phalanx and the proximal phalanx of the left second toe as well as the left second metatarsal head however he has no wounds in this area. 4/12; culture from last time of bone in the right great toe was negative. The patient's wife apparently scrubbed this toe vigorously and according to his daughter reopen the wound however unfortunately it is all exposed bone last time. I removed some of this and we use Prisma unfortunately the area does not look much better today. Previous x-rays done in podiatry office Dr. Caprice Beaver did not show evidence of osteomyelitis. Nevertheless I think osteomyelitis here is probably quite likely. Still using Prisma 4/26; he took 2 weeks of doxycycline although his daughter is telling me he is not eating. He has loose bowel movements however I did not get the sense that this is changed all that much. He still has exposed bone. Culture I did last time showed a few coag negative staph I do not think that this is necessarily relevant. I still am operating under the premise that the patient probably has osteomyelitis. After considerable difficulty we are able to determine he is not allergic to cephalosporins I therefore gave him Keflex. 5/10; is tolerating Keflex 500 3 times daily quite well. He still has the small wound in the tip of his toe with exposed bone.Marland Kitchen Unfortunately he does not really have any granulation tissue. He saw Dr. Caprice Beaver of podiatry in  [place] last week I have not had a chance to look at his notes The patient had an extensive vascular work-up including an angiogram in 2019. He was felt to have adequate blood supply on the right at the time although he did have amputations of the left first and second toe by Dr. Oneida Alar. 09/11/19 -Patient is back and is about to finish his Keflex, appointment with Dr. Oneida Alar is in the works. The right great toe wound looks about the same, the left third toe dorsal wound has healed. There was a small eschar covering it that was removed by podiatry.According to patient's daughter the right great toe base on the plantar surface had some redness that seems to have disappeared. 10/13/2019; patient is back in clinic today after 1 month hiatus although I have not seen him since 5/10. Since he was last here he was admitted to hospital from 6/2 through 6/5 because of a nonhealing wound on his right great toe. He underwent an angiogram which showed patent common femoral, profunda, SFA above and below-knee popliteal artery. The dominant runoff in the right lower extremity was through the anterior tibial  but that became very small at the level of the ankle and had minimal outflow into the foot. The peroneal artery was patent but atretic. 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 Interventions were attempted on the posterior tibial. Mechanical orbital atherectomy down to the level of the ankle and then this was angioplastied with 2.3 mm and 3 mm balloons. Flow remained very sluggish down the posterior tibial artery but was patent. Post event there apparently was embolization of an area involving the plantar left heel and sides of the left heel. He arrives back in clinic today it has been almost 7 weeks since I have seen him. Miraculously the great toe area with open bone is closed over  probably as result of Dr. Ainsley Spinner interventions. He does however have an extensive ischemic area on the plantar and medial aspect of his heel. I reviewed Dr. Ainsley Spinner last note from 3 days ago on 6/22. He did not think there was enough inflow to heal the heel wound. With eschar and sloughing. Wondered whether he could heal a below-knee amputation but more likely an above the knee amputation when required. He was placed on Xeroform to the right heel In discussing things with the patient's family and the patient present he is not in a lot of pain. The area does not appear to be grossly infected. The patient appears to be comfortable and conversational. He is eating and drinking fairly well. 7/9; the patient saw Dr. Carlis Abbott on 6/29. He had previously done heroic attempt to establish blood flow as described above. 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 Although the eschar on the heel looks ominous both plantar and medial it is certainly no worse than last week. We will continue Betadine to this area with alginate. Ultimately this will separate off and we will see about the viability of the tissue underneath. Also in clinic today our nurses were washing his right great toe tip apparently some of the skin came off eschared and there is an open wound at the tip of the great toe with exposed bone similar to what he had previously 7/23; he has the open area on the tip of his first toe which I think is mostly bone. Then he has the embolic area on the plantar heel extending into the medial heel. We have been using Betadine and silver alginate to the heel silver alginate to the toe. His daughter brings up that she was given nitroglycerin patches by Dr. Donzetta Matters to try and open blood flow to the foot. She has not been using them out of concern of systemic hypotension and also that the labels said not to  put them on extremities 8/10-Returns at 3 weeks, open area on the tip of his first toe that is dried out, the heel with eschar, applying Betadine to the eschar area and silver alginate dressing. 9/7; 1 month follow-up. He has an open area on the tip of the toe almost subungual with a thick mycotic nail. The heel wound has black eschar but this is beginning to separate. We have been using Betadine and silver alginate on the heel and simply silver alginate on the right great toe 9/23; the patient was worked in urgently at the request of his wife was concerned about infection in the tip of the  toe. Apparently this was brought up by home health. 10/14; the patient has the area of exposed bone on the tip of the right great toe. He also has the eschared area on the plantar heel which was a embolic phenomenon at the time of attempted revascularization. He also had an area of denuded epithelium over both buttocks which I think is probably a combination of pressure friction and moisture. They have been using zinc oxide which is appropriate Electronic Signature(s) Signed: 02/01/2020 5:33:07 PM By: Linton Ham MD Entered By: Linton Ham on 02/01/2020 14:07:41 -------------------------------------------------------------------------------- Physician Orders Details Patient Name: Date of Service: [name] 02/01/2020 1:15 PM Medical Record Number: [id number] Patient Account Number: [id number] Date of Birth/Sex: Treating RN: [birth year] (86 y.o. [name] Primary Care Provider: Sherrie Mustache Other Clinician: Referring Provider: Treating Provider/Extender: Deloria Lair in Treatment: 27 Verbal / Phone Orders: No Diagnosis Coding ICD-10 Coding Code Description E11.621 Type 2 diabetes mellitus with foot ulcer L97.514 Non-pressure chronic ulcer of other part of right foot with necrosis of bone E11.40 Type 2 diabetes mellitus with diabetic neuropathy,  unspecified Follow-up Appointments Return appointment in 1 month. Dressing Change Frequency Wound #2 Right T Great oe Change Dressing every other day. - home health to change 3x/week. Wound #3 Right Calcaneus Change dressing every day. - home health to change 3x/week and family to change on other days Wound Cleansing May shower and wash wound with soap and water. - on days that dressing is changed Primary Wound Dressing Wound #2 Right T Great oe Silver Collagen - moisten with hydrogel or KY Jelly. Wound #3 Right Calcaneus Other: - betadine on heel. Secondary Dressing Wound #3 Right Calcaneus Kerlix/Rolled Gauze Dry Gauze Heel Cup Off-Loading Turn and reposition every 2 hours - no scooting on buttock. for any redness, or excoriation apply zinc oxide barrier cream to buttock to aid the buttock areas. [facility] for wound care. - [facility] Electronic Signature(s) Signed: 02/01/2020 5:33:07 PM By: Linton Ham MD Signed: 02/01/2020 5:51:00 PM By: Deon Pilling Entered By: Deon Pilling on 02/01/2020 14:02:29 -------------------------------------------------------------------------------- Problem List Details Patient Name: Date of Service: [name] 02/01/2020 1:15 PM Medical Record Number: [id number] Patient Account Number: [id number] Date of Birth/Sex: Treating RN: [birth year] (86 y.o. [name] Primary Care Provider: Sherrie Mustache Other Clinician: Referring Provider: Treating Provider/Extender: Deloria Lair in Treatment: 27 Active Problems ICD-10 Encounter Code Description Active Date MDM Diagnosis E11.621 Type 2 diabetes mellitus with foot ulcer 07/25/2019 No Yes L97.514 Non-pressure chronic ulcer of other part of right foot with necrosis of bone 07/25/2019 No Yes E11.40 Type 2 diabetes mellitus with diabetic neuropathy, unspecified 07/25/2019 No Yes Inactive Problems Resolved  Problems Electronic Signature(s) Signed: 02/01/2020 5:33:07 PM By: Linton Ham MD Entered By: Linton Ham on 02/01/2020 14:04:25 -------------------------------------------------------------------------------- Progress Note Details Patient Name: Date of Service: [name] 02/01/2020 1:15 PM Medical Record Number: [id number] Patient Account Number: [id number] Date of Birth/Sex: Treating RN: 09/07/1923 (86 y.o. [name] Primary Care Provider: Sherrie Mustache Other Clinician: Referring Provider: Treating Provider/Extender: Deloria Lair in Treatment: 27 Subjective History of Present Illness (HPI) ADMISSION 05/02/2019 This is a 86 year old man accompanied by his daughter. He lives in [place] with his elderly wife. He has home caregivers as well. Apparently on February 28, 2019 they noted blood on his sock and noticed a wound on the tip of his right  great toe. He has been cared for by Dr. Posey Pronto who is his podiatrist in [place]. Apparently they have been using Betadine to this area. The patient has a thick mycotic nail on the right as well which is embedded in the wound in some areas. Fortunately the patient has diabetic neuropathy and is not really experiencing any pain otherwise I think this would all be quite painful. The patient was previously treated in 2019 for wounds on the left foot. This included a fairly thorough vascular review by vein and vascular Dr. Oneida Alar. His ABIs at that time were noncompressible bilaterally TBI on the right was 0.88 with biphasic waveforms on the right and monophasic waveforms at the left. He ended up having amputations of the left first and second toes. He did have an angiogram on 06/25/2017 which showed on the right the right common femoral profundofemoral for Morris and superficial femoral arteries were widely patent the right popliteal was widely patent. Tibial vessels were not well  opacified secondary to motion artifact however he was felt to have all 3 tibial vessels intact with some calcification and some areas of stenosis within the posterior tibial artery but relatively good flow to the right lower leg. He also had the left reviewed as well which was the source of the problem at that time. Past medical history; prostate cancer on oral antiandrogen therapy with bone mets, GI stromal tumor in 2005, type 2 diabetes with peripheral neuropathy, hypertension, atrial fibrillation on Eliquis, history of non-Hodgkin's lymphoma treated with CHOP in 2005. He had amputations of the left first and second toes secondary to osteomyelitis. Does not appear that he has had an x-ray of the right foot ABI in our clinic on the right was noncompressible [see full arterial discussion above]. 1/19; area on the tip of the right great toe under a very mycotic nail tip. We use silver alginate. 1/26; area of the tip of the right great toe under a very mycotic nail. A lot of this seems to have closed down we have been using silver alginate 2/9; the areas on the tip of the right great toe under a very mycotic nail. A lot of this continues to close down. I do not think there is a subungual problem. I am hopeful that this mycotic nail which is thick and raised will allow full epithelialization of the wound 2/23; this was a difficult area on the tip of his right great toe. Thick mycotic nail over the base of the wound. This appears to have closed down now. The nail itself is thick split with considerable subungual debris. READMISSION 07/25/2019 This is a 86 year old man that we cared for earlier this year in the clinic without a wound on the tip of his right great toe under a very mycotic toenail. He is a type II diabetic. His ABIs have been noncompressible however he has been felt to have adequate blood flow in the right to heal the wound. We were able to get this wound to close over as I understand things  he followed with Dr. Caprice Beaver had some debridement of the nail everything looks fine and apparently the wound bed was washed off by his wife over the weekend and by Monday it was open. His daughter is able to show me pictures. Unfortunately this now has exposed bone raising the possibility that he has had underlying osteomyelitis all along. He has been using Silvadene cream on this he has been referred back to the clinic by Dr. Caprice Beaver. He  had x-rays of the right foot that did not show evidence of osteomyelitis in the right foot. He has not been on antibiotics He also had an x-ray of the left foot that showed findings suspicious for osteomyelitis involving the residual base of the first proximal phalanx and the proximal phalanx of the left second toe as well as the left second metatarsal head however he has no wounds in this area. 4/12; culture from last time of bone in the right great toe was negative. The patient's wife apparently scrubbed this toe vigorously and according to his daughter reopen the wound however unfortunately it is all exposed bone last time. I removed some of this and we use Prisma unfortunately the area does not look much better today. Previous x-rays done in podiatry office Dr. Caprice Beaver did not show evidence of osteomyelitis. Nevertheless I think osteomyelitis here is probably quite likely. Still using Prisma 4/26; he took 2 weeks of doxycycline although his daughter is telling me he is not eating. He has loose bowel movements however I did not get the sense that this is changed all that much. He still has exposed bone. Culture I did last time showed a few coag negative staph I do not think that this is necessarily relevant. I still am operating under the premise that the patient probably has osteomyelitis. After considerable difficulty we are able to determine he is not allergic to cephalosporins I therefore gave him Keflex. 5/10; is tolerating Keflex 500 3 times daily quite  well. He still has the small wound in the tip of his toe with exposed bone.Marland Kitchen Unfortunately he does not really have any granulation tissue. He saw Dr. Caprice Beaver of podiatry in [place] last week I have not had a chance to look at his notes The patient had an extensive vascular work-up including an angiogram in 2019. He was felt to have adequate blood supply on the right at the time although he did have amputations of the left first and second toe by Dr. Oneida Alar. 09/11/19 -Patient is back and is about to finish his Keflex, appointment with Dr. Oneida Alar is in the works. The right great toe wound looks about the same, the left third toe dorsal wound has healed. There was a small eschar covering it that was removed by podiatry.According to patient's daughter the right great toe base on the plantar surface had some redness that seems to have disappeared. 10/13/2019; patient is back in clinic today after 1 month hiatus although I have not seen him since 5/10. Since he was last here he was admitted to hospital from 6/2 through 6/5 because of a nonhealing wound on his right great toe. He underwent an angiogram which showed patent common femoral, profunda, SFA above and below-knee popliteal artery. The dominant runoff in the right lower extremity was through the anterior tibial but that became very small at the level of the ankle and had minimal outflow into the foot. The peroneal artery was patent but atretic. 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 Interventions were attempted on the posterior tibial. Mechanical orbital atherectomy down to the level of the ankle and then this was angioplastied with 2.3 mm and 3 mm balloons. Flow remained very sluggish down the posterior tibial artery but was patent. Post event there apparently was embolization of an area involving the plantar left heel  and sides of the left heel.  He arrives back in clinic today it has been almost 7 weeks since I have seen him. Miraculously the great toe area with open bone is closed over probably as result of Dr. Ainsley Spinner interventions. He does however have an extensive ischemic area on the plantar and medial aspect of his heel. I reviewed Dr. Ainsley Spinner last note from 3 days ago on 6/22. He did not think there was enough inflow to heal the heel wound. With eschar and sloughing. Wondered whether he could heal a below-knee amputation but more likely an above the knee amputation when required. He was placed on Xeroform to the right heel In discussing things with the patient's family and the patient present he is not in a lot of pain. The area does not appear to be grossly infected. The patient appears to be comfortable and conversational. He is eating and drinking fairly well. 7/9; the patient saw Dr. Carlis Abbott on 6/29. He had previously done heroic attempt to establish blood flow as described above. 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 Although the eschar on the heel looks ominous both plantar and medial it is certainly no worse than last week. We will continue Betadine to this area with alginate. Ultimately this will separate off and we will see about the viability of the tissue underneath. Also in clinic today our nurses were washing his right great toe tip apparently some of the skin came off eschared and there is an open wound at the tip of the great toe with exposed bone similar to what he had previously 7/23; he has the open area on the tip of his first toe which I think is mostly bone. Then he has the embolic area on the plantar heel extending into the medial heel. We have been using Betadine and silver alginate to the heel silver alginate to the toe. His daughter brings up that she was given nitroglycerin  patches by Dr. Donzetta Matters to try and open blood flow to the foot. She has not been using them out of concern of systemic hypotension and also that the labels said not to put them on extremities 8/10-Returns at 3 weeks, open area on the tip of his first toe that is dried out, the heel with eschar, applying Betadine to the eschar area and silver alginate dressing. 9/7; 1 month follow-up. He has an open area on the tip of the toe almost subungual with a thick mycotic nail. The heel wound has black eschar but this is beginning to separate. We have been using Betadine and silver alginate on the heel and simply silver alginate on the right great toe 9/23; the patient was worked in urgently at the request of his wife was concerned about infection in the tip of the toe. Apparently this was brought up by home health. 10/14; the patient has the area of exposed bone on the tip of the right great toe. He also has the eschared area on the plantar heel which was a embolic phenomenon at the time of attempted revascularization. He also had an area of denuded epithelium over both buttocks which I think is probably a combination of pressure friction and moisture. They have been using zinc oxide which is appropriate Objective Constitutional Vitals Time Taken: 1:17 PM, Height: 70 in, Source: Stated, Weight: 140 lbs, Source: Stated, BMI: 20.1, Temperature: 97.8 F, Pulse: 75 bpm, Respiratory Rate: 18 breaths/min, Blood Pressure: 181/75 mmHg, Capillary Blood Glucose:  174 mg/dl. General Notes: glucose per pt report this am Integumentary (Hair, Skin) Wound #2 status is Open. Original cause of wound was Gradually Appeared. The wound is located on the Right T Great. The wound measures 0.5cm length x oe 0.7cm width x 0.2cm depth; 0.275cm^2 area and 0.055cm^3 volume. There is bone and Fat Layer (Subcutaneous Tissue) exposed. There is no tunneling or undermining noted. There is a small amount of purulent drainage noted. The  wound margin is distinct with the outline attached to the wound base. There is small (1-33%) pink, pale granulation within the wound bed. There is no necrotic tissue within the wound bed. Wound #3 status is Open. Original cause of wound was Gradually Appeared. The wound is located on the Right Calcaneus. The wound measures 4.6cm length x 6.3cm width x 0.1cm depth; 22.761cm^2 area and 2.276cm^3 volume. There is no tunneling or undermining noted. There is a small amount of serosanguineous drainage noted. The wound margin is flat and intact. There is no granulation within the wound bed. There is a large (67-100%) amount of necrotic tissue within the wound bed including Eschar. Assessment Active Problems ICD-10 Type 2 diabetes mellitus with foot ulcer Non-pressure chronic ulcer of other part of right foot with necrosis of bone Type 2 diabetes mellitus with diabetic neuropathy, unspecified Plan Follow-up Appointments: Return appointment in 1 month. Dressing Change Frequency: Wound #2 Right T Great: oe Change Dressing every other day. - home health to change 3x/week. Wound #3 Right Calcaneus: Change dressing every day. - home health to change 3x/week and family to change on other days Wound Cleansing: May shower and wash wound with soap and water. - on days that dressing is changed Primary Wound Dressing: Wound #2 Right T Great: oe Silver Collagen - moisten with hydrogel or KY Jelly. Wound #3 Right Calcaneus: Other: - betadine on heel. Secondary Dressing: Wound #3 Right Calcaneus: Kerlix/Rolled Gauze Dry Gauze Heel Cup Off-Loading: Turn and reposition every 2 hours - no scooting on buttock. for any redness, or excoriation apply zinc oxide barrier cream to buttock to aid the buttock areas. Home Health: [facility] for wound care. - [facility]. The tip of the right great toe has exposed bone. I could attempted a debridement here but I do not think there is  enough surrounding tissue to cover this. Furthermore its mostly under his nail. I think this represents chronic osteomyelitis but there is no active infection 2. Ischemic area on the plantar heel this is separating I think I can probably remove this when I see him the next time 3. Excoriation over both buttocks likely "diaper rash". We talked about this. I agree with zinc oxide Electronic Signature(s) Signed: 02/01/2020 5:33:07 PM By: Linton Ham MD Entered By: Linton Ham on 02/01/2020 14:08:47 -------------------------------------------------------------------------------- SuperBill Details Patient Name: Date of Service: [name] 02/01/2020 Medical Record Number: [id number] Patient Account Number: [id number] Date of Birth/Sex: Treating RN: Sep 23, 1923 (86 y.o. [name] Primary Care Provider: Sherrie Mustache Other Clinician: Referring Provider: Treating Provider/Extender: Deloria Lair in Treatment: 27 Diagnosis Coding ICD-10 Codes Code Description E11.621 Type 2 diabetes mellitus with foot ulcer L97.514 Non-pressure chronic ulcer of other part of right foot with necrosis of bone E11.40 Type 2 diabetes mellitus with diabetic neuropathy, unspecified Facility Procedures CPT4 Code: [id number] Description: [phone] - WOUND CARE VISIT-LEV 5 EST PT Modifier: Quantity: 1 Physician Procedures : CPT4 Code Description Modifier [id number] - WC PHYS LEVEL 3 - EST  PT ICD-10 Diagnosis Description E11.621 Type 2 diabetes mellitus with foot ulcer L97.514 Non-pressure chronic ulcer of other part of right foot with necrosis of bone E11.40 Type 2  diabetes mellitus with diabetic neuropathy, unspecified Quantity: 1 Electronic Signature(s) Signed: 02/01/2020 5:33:07 PM By: Linton Ham MD Entered By: Linton Ham on 02/01/2020 14:09:05

## 2020-02-03 ENCOUNTER — Ambulatory Visit
Admission: RE | Admit: 2020-02-03 | Discharge: 2020-02-03 | Disposition: A | Discharge status: Home / Self Care | Payer: Medicare Other | Source: Ambulatory Visit | Attending: Emergency Medicine | Admitting: Emergency Medicine

## 2020-02-03 ENCOUNTER — Other Ambulatory Visit: Payer: Self-pay

## 2020-02-03 VITALS — BP 103/68 | HR 71 | Temp 100.4°F | Resp 18 | Ht 70.0 in | Wt 138.9 lb

## 2020-02-03 DIAGNOSIS — R509 Fever, unspecified: Secondary | ICD-10-CM | POA: Diagnosis not present

## 2020-02-03 DIAGNOSIS — R5381 Other malaise: Secondary | ICD-10-CM | POA: Diagnosis not present

## 2020-02-03 DIAGNOSIS — R5383 Other fatigue: Secondary | ICD-10-CM | POA: Insufficient documentation

## 2020-02-03 DIAGNOSIS — M545 Low back pain, unspecified: Secondary | ICD-10-CM | POA: Insufficient documentation

## 2020-02-03 DIAGNOSIS — R69 Illness, unspecified: Secondary | ICD-10-CM | POA: Diagnosis not present

## 2020-02-03 DIAGNOSIS — W19XXXA Unspecified fall, initial encounter: Secondary | ICD-10-CM | POA: Diagnosis not present

## 2020-02-03 LAB — POCT URINALYSIS DIP (MANUAL ENTRY)
Bilirubin, UA: NEGATIVE
Glucose, UA: NEGATIVE mg/dL
Ketones, POC UA: NEGATIVE mg/dL
Leukocytes, UA: NEGATIVE
Nitrite, UA: NEGATIVE
Protein Ur, POC: 30 mg/dL — AB
Spec Grav, UA: 1.015 (ref 1.010–1.025)
Urobilinogen, UA: 0.2 E.U./dL
pH, UA: 5.5 (ref 5.0–8.0)

## 2020-02-03 MED ORDER — SULFAMETHOXAZOLE-TRIMETHOPRIM 800-160 MG PO TABS: 1.0000 | ORAL_TABLET | Freq: Two times a day (BID) | ORAL | 0 refills | Status: AC

## 2020-02-03 NOTE — Discharge Instructions (Addendum)
Blood in urine.  Given current symptoms and hx of frequent UTIs will treat for infection Urine culture sent.  We will call you with the results.   Push fluids and get plenty of rest.   Take antibiotic as directed and to completion Follow up with PCP this week for recheck Return here or go to ER if you have any new or worsening symptoms such as fever, worsening back pain, nausea/vomiting, flank pain, etc..Marland Kitchen

## 2020-02-03 NOTE — ED Provider Notes (Signed)
[facility]   CC: Concern for UTI  SUBJECTIVE: HPI obtained from family [name] is a 86 y.o. male who complains of low back pain and not feeling well, started today.  Patient denies a precipitating event.  Reports similar symptoms in the past with UTI.  Denies alleviating or aggravating factors.  Admits to similar symptoms in the past.  Reports associated fever, tmax of 100.4 in office.  Denies chills, nausea, vomiting, abdominal pain, flank pain, hematuria.    LMP: No LMP for male patient.  ROS: As in HPI.  All other pertinent ROS negative.     Past Medical History:  Diagnosis Date  . Acute bronchitis   . Acute lower UTI 11/25/2017  . Amputation of toe of left foot ([place]) 07/30/2017, 08/27/2017    Great toe 07/30/2017 Second toe 08/27/2017   . Arthritis   . Aspiration pneumonia ([place]) 01/31/2018  . Atrial fibrillation ([place])    Dr. Harl Bowie- [place] follows saw 11'14  . Bladder stones    tx. with oral meds and antibiotics, now surgery planned  . Bone metastases ([place]) 08/21/2015  . Cataracts, both eyes    surgery planned May 2015  . Community acquired pneumonia 04/16/2018  . Cystoid macular edema 04/21/2015    Right eye 04/21/2015   . Dehydration 04/16/2018  . Dementia without behavioral disturbance ([place]) 04/16/2018  . Diabetes mellitus without complication ([place])    Type II  . Diarrhea 11/25/2017  . Dyspnea    with activity  . Dysrhythmia    Afib  . Family history of breast cancer   . Family history of colon cancer   . Genetic testing 02/09/2017  . GERD (gastroesophageal reflux disease)   . 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 Margot Chimes, thus far without recurrence.   Marland Kitchen History of MRI of lumbar spine 03/05/2014  .  Hypertension   . Hypertrophy of prostate with urinary obstruction   . Hypomagnesemia 03/16/2017  . Metabolic encephalopathy 53/66/4403  . Metastatic adenocarcinoma to prostate ([place]) 09/08/2013  . 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 NHL (non-Hodgkin's lymphoma) ([place]) 2005   Diffuse large B-cell lymphoma, clinically stage IIIA, CD20 positive, status post cervical lymph node biopsy 08/03/2003 on the left. PET scan was also positive in the spleen and small bowel region, but bone marrow aspiration and biopsy were negative. So he essentially had stage IIIAs. He received R-CHOP x6 cycles with CR established by PET scan criteria on 11/23/2003 with no evidence for relapse th  . Numbness 02/19/2017   Per [facility] new patient packet  . Osteomyelitis ([place]) 06/29/2017   toe of left foot   . PAD (peripheral artery disease) ([place]) 08/27/2017  . Polyneuropathy 02/19/2017  . Postural dizziness with presyncope 02/19/2017  . S/P TURP 08/10/2013  . Sepsis ([place]) 07/02/2018  . Skin cancer    Basal cell- face,head, neck,  arms, legs, Back  . Urinary frequency 02/19/2017   Past Surgical History:  Procedure Laterality Date  . ABDOMINAL AORTOGRAM W/LOWER EXTREMITY N/A 09/20/2019   Procedure: ABDOMINAL AORTOGRAM W/LOWER EXTREMITY;  Surgeon: Marty Heck, MD;  Location: [facility];  Service: Cardiovascular;  Laterality: N/A;  . AMPUTATION Left 06/29/2017   Procedure: AMPUTATION LEFT GREAT TOE;  Surgeon: [name], MD;  Location: [facility];  Service: Vascular;  Laterality: Left;  . AMPUTATION Left 08/27/2017   Procedure: AMPUTATION Left SECOND TOE;  Surgeon: [name], MD;  Location: [facility];  Service: Vascular;  Laterality: Left;  . CATARACT EXTRACTION Bilateral 2015  . CHOLECYSTECTOMY    . COLON RESECTION     small bowel  . CYSTOSCOPY WITH LITHOLAPAXY N/A 08/10/2013   Procedure: CYSTOSCOPY WITH LITHOLAPAXY WITH [name];  Surgeon: [name], MD;  Location: [facility];   Service: Urology;  Laterality: N/A;  . ESOPHAGEAL DILATION N/A 02/27/2016   Procedure: ESOPHAGEAL DILATION;  Surgeon: [name], MD;  [facility];  Service: Endoscopy;  Laterality: N/A;  . ESOPHAGEAL DILATION N/A 07/07/2018   Procedure: ESOPHAGEAL DILATION;  Surgeon: [name], MD;  [facility];  Service: Endoscopy;  Laterality: N/A;  . ESOPHAGOGASTRODUODENOSCOPY N/A 02/27/2016   Procedure: ESOPHAGOGASTRODUODENOSCOPY (EGD);  Surgeon: [name], MD;  [facility];  Service: Endoscopy;  Laterality: N/A;  12:00  . ESOPHAGOGASTRODUODENOSCOPY N/A 07/07/2018   Procedure: ESOPHAGOGASTRODUODENOSCOPY (EGD);  Surgeon: [name], MD;  [facility];  Service: Endoscopy;  Laterality: N/A;  . INCISION AND DRAINAGE PERIRECTAL ABSCESS    . LOWER EXTREMITY ANGIOGRAPHY N/A 06/25/2017   Procedure: LOWER EXTREMITY ANGIOGRAPHY;  Surgeon: [name], MD;  Location: [facility];  Service: Cardiovascular;  Laterality: N/A;  . LYMPH NODE DISSECTION Left    '05-neck  . PERIPHERAL VASCULAR ATHERECTOMY Right 09/20/2019   Procedure: PERIPHERAL VASCULAR ATHERECTOMY;  Surgeon: Marty Heck, MD;  Location: [facility];  Service: Cardiovascular;  Laterality: Right;  Posterior tibial  . PORT-A-CATH REMOVAL    . PORTACATH PLACEMENT     insertion and removal -last chemotherapy 10 yrs ago  . TRANSURETHRAL RESECTION OF PROSTATE N/A 08/10/2013   Procedure: TRANSURETHRAL RESECTION OF THE PROSTATE WITH GYRUS INSTRUMENTS;  Surgeon: [name], MD;  [facility];  Service: Urology;  Laterality: N/A;   Allergies  Allergen Reactions  . Tape Other (See Comments)    SKIN IS VERY THIN AND WILL TEAR AND BRUISE VERY EASILY!!!!  . Augmentin [Amoxicillin-Pot Clavulanate] Rash and Other (See Comments)    Redness of skin   No current facility-administered medications on file prior to encounter.   Current Outpatient Medications on File  Prior to Encounter  Medication Sig Dispense Refill  . abiraterone acetate (ZYTIGA) 250 MG tablet Take 1 tablet (250 mg total) by mouth daily before breakfast. Take on an empty stomach 1 hour before or 2 hours after a meal 120 tablet 0  . acetaminophen (TYLENOL) 500 MG tablet Take 1 tablet (500 mg total) by mouth 3 (three) times daily. 30 tablet 0  . apixaban (ELIQUIS) 5 MG TABS tablet Take 5 mg by mouth 2 (two) times daily.    . Aspirin 81 MG CAPS 81 mg daily.     . blood glucose meter kit and supplies Dispense based on patient and insurance preference. Use four times daily as directed. (FOR ICD-10 E10.9, E11.9). 1 each 0  . Calcium Polycarbophil (FIBER-CAPS PO) Take 1 capsule by mouth in the morning and at bedtime.     . Cholecalciferol (VITAMIN D3) 1000 units CAPS Take 1,000 Units by mouth daily.     . clotrimazole-betamethasone (LOTRISONE) cream Apply 1 application topically 2 (two) times daily as needed (skin irriation.).     Marland Kitchen Cranberry 500 MG TABS Take 500 mg by mouth daily.     Marland Kitchen  denosumab (XGEVA) 120 MG/1.7ML SOLN injection Inject 120 mg into the skin every 30 (thirty) days.    . diclofenac Sodium (VOLTAREN) 1 % GEL Apply 2 g topically 4 (four) times daily. 150 g 0  . diltiazem (CARDIZEM SR) 90 MG 12 hr capsule Take 1 capsule (90 mg total) by mouth 2 (two) times daily. 60 capsule 11  . donepezil (ARICEPT) 10 MG tablet Take 1 tablet (10 mg total) by mouth daily. 90 tablet 1  . feeding supplement, ENSURE ENLIVE, (ENSURE ENLIVE) LIQD Take 237 mLs by mouth every other day. CHOCOLATE     . fluticasone (FLONASE) 50 MCG/ACT nasal spray Place 2 sprays into both nostrils daily as needed (allergies.).     Marland Kitchen furosemide (LASIX) 20 MG tablet Take 20 mg by mouth daily as needed (retention/swelling.).     Marland Kitchen glipiZIDE (GLUCOTROL) 5 MG tablet Take 1 tablet (5 mg total) by mouth daily before breakfast. 90 tablet 1  . glucose blood (EASYMAX TEST) test strip Use as instructed 100 each 12  . Lancets 28G MISC  1 kit by Does not apply route every morning. 100 each 1  . Leuprolide Acetate, 6 Month, (LUPRON DEPOT, 48-MONTH,) 45 MG injection Inject 45 mg into the muscle every 6 (six) months.    Marland Kitchen lisinopril (ZESTRIL) 20 MG tablet Take 1 tablet (20 mg total) by mouth in the morning and at bedtime. 60 tablet 6  . metFORMIN (GLUCOPHAGE) 850 MG tablet Take 850 mg by mouth 2 (two) times daily with a meal.     . mirtazapine (REMERON) 15 MG tablet Take 1 tablet (15 mg total) by mouth at bedtime. 30 tablet 0  . Multiple Vitamin (MULTIVITAMIN WITH MINERALS) TABS tablet Take 1 tablet by mouth daily.     . nitroGLYCERIN (NITRODUR - DOSED IN MG/24 HR) 0.2 mg/hr patch Place 1 patch (0.2 mg total) onto the skin daily. 30 patch 12  . Probiotic Product (PROBIOTIC DAILY PO) Take 1 tablet by mouth daily. Every morning    . psyllium (METAMUCIL) 58.6 % packet Take 1 packet by mouth daily.    . traMADol (ULTRAM) 50 MG tablet Take 1 tablet (50 mg total) by mouth every 6 (six) hours as needed for moderate pain. 30 tablet 0   Social History   Socioeconomic History  . Marital status: Married    Spouse name: Not on file  . Number of children: Not on file  . Years of education: Not on file  . Highest education level: Not on file  Occupational History  . Occupation: retired    Fish farm manager: RETIRED    Comment: salesman  Tobacco Use  . Smoking status: Never Smoker  . Smokeless tobacco: Never Used  Vaping Use  . Vaping Use: Never used  Substance and Sexual Activity  . Alcohol use: No  . Drug use: No  . Sexual activity: Not Currently  Other Topics Concern  . Not on file  Social History Narrative   Diet: Soft foods      Do you drink/eat things with caffeine No      Marital Status: Marred   What year were you married? 1949      Do you live in a house, apartment, assisted living, condo, trailer, etc.? House      Is it one or more stories? One      How many persons live in your home? 2         Do you have any pets in  your home?(please  list) No      Highest level of education completed: 9th      Current or past profession: [employer]      Do you exercise? No Type and how often:      Living Will? Yes      DNR form? NO  If not do you wish to discuss one? Yes      POA/HPOA forms? Yes      Difficulty bathing or dressing yourself? Yes      Difficulty preparing food or eating? Yes      Difficulty managing medications? Yes      Difficulty managing your finances? Yes      Difficulty affording your medications? No                     Social Determinants of Health   Financial Resource Strain:   . Difficulty of Paying Living Expenses: Not on file  Food Insecurity:   . Worried About Charity fundraiser in the Last Year: Not on file  . Ran Out of Food in the Last Year: Not on file  Transportation Needs: No Transportation Needs  . Lack of Transportation (Medical): No  . Lack of Transportation (Non-Medical): No  Physical Activity:   . Days of Exercise per Week: Not on file  . Minutes of Exercise per Session: Not on file  Stress:   . Feeling of Stress : Not on file  Social Connections:   . Frequency of Communication with Friends and Family: Not on file  . Frequency of Social Gatherings with Friends and Family: Not on file  . Attends Religious Services: Not on file  . Active Member of Clubs or Organizations: Not on file  . Attends Archivist Meetings: Not on file  . Marital Status: Not on file  Intimate Partner Violence:   . Fear of Current or Ex-Partner: Not on file  . Emotionally Abused: Not on file  . Physically Abused: Not on file  . Sexually Abused: Not on file   Family History  Problem Relation Age of Onset  . Heart attack Mother        died in her 41s  . Other Father 20       pneumonia - died  . Breast cancer Sister        dx in her 9s-60s  . Colon cancer Brother        dx in his 23s  . Cancer Sister        TAH/BSO due to "male cancer"  . Breast cancer  Daughter 67  . Breast cancer Daughter 24       DCIS - ATM VUS on [employer]ae 83 gene panel in 2018  . Breast cancer Daughter 36       DCIS - Negative on My[employer] MyRisk 25 gene apnel in 2015  . Thyroid cancer Daughter 45       Medullary and Papillary  . Thyroid nodules Grandchild     OBJECTIVE:  Vitals:   02/03/20 1508 02/03/20 1509  BP:  103/68  Pulse:  71  Resp:  18  Temp:  (!) 100.4 F (38 C)  TempSrc:  Oral  SpO2:  97%  Weight: 138 lb 14.2 oz (63 kg)   Height: _0  (1.778 m)    General appearance: Alert in no acute distress; frail appearing HEENT: NCAT.  Oropharynx clear.  Lungs: clear to auscultation bilaterally without adventitious breath sounds Heart: murmur present Abdomen: soft;  non-distended; no tenderness; bowel sounds present; no guarding Skin: warm and dry Neurologic: sitting in wheelchair Psychological: alert and cooperative; normal mood and affect  Labs Reviewed  POCT URINALYSIS DIP (MANUAL ENTRY) - Abnormal; Notable for the following components:      Result Value   Blood, UA moderate (*)    Protein Ur, POC =30 (*)    All other components within normal limits  URINE CULTURE    ASSESSMENT & PLAN:  1. Fever, unspecified   2. Acute low back pain, unspecified back pain laterality, unspecified whether sciatica present   3. Other fatigue     Meds ordered this encounter  Medications  . sulfamethoxazole-trimethoprim (BACTRIM DS) 800-160 MG tablet    Sig: Take 1 tablet by mouth 2 (two) times daily for 10 days.    Dispense:  20 tablet    Refill:  0    Order Specific Question:   Supervising Provider    Answer:   Raylene Everts [[id number]]   Blood in urine.  Given current symptoms and hx of frequent UTIs will treat for infection Urine culture sent.  We will call you with the results.   Push fluids and get plenty of rest.   Take antibiotic as directed and to completion Follow up with PCP this week for recheck Return here or go to ER if you have any new  or worsening symptoms such as fever, worsening back pain, nausea/vomiting, flank pain, etc...  Outlined signs and symptoms indicating need for more acute intervention. Patient verbalized understanding. After Visit Summary given.     Lestine Box, PA-C 02/03/20 1523

## 2020-02-03 NOTE — ED Triage Notes (Addendum)
Low back pain and general not feeling well that started today. Pt had one tylenol at 1230 today

## 2020-02-04 LAB — URINE CULTURE
Culture: 50000 — AB
Special Requests: NORMAL

## 2020-02-07 ENCOUNTER — Telehealth: Payer: Self-pay | Admitting: Cardiology

## 2020-02-07 ENCOUNTER — Telehealth: Payer: Self-pay

## 2020-02-07 ENCOUNTER — Ambulatory Visit: Payer: Medicare Other | Admitting: Nurse Practitioner

## 2020-02-07 DIAGNOSIS — E1142 Type 2 diabetes mellitus with diabetic polyneuropathy: Secondary | ICD-10-CM | POA: Diagnosis not present

## 2020-02-07 DIAGNOSIS — C8581 Other specified types of non-Hodgkin lymphoma, lymph nodes of head, face, and neck: Secondary | ICD-10-CM | POA: Diagnosis not present

## 2020-02-07 DIAGNOSIS — C61 Malignant neoplasm of prostate: Secondary | ICD-10-CM | POA: Diagnosis not present

## 2020-02-07 DIAGNOSIS — E1151 Type 2 diabetes mellitus with diabetic peripheral angiopathy without gangrene: Secondary | ICD-10-CM | POA: Diagnosis not present

## 2020-02-07 DIAGNOSIS — L97514 Non-pressure chronic ulcer of other part of right foot with necrosis of bone: Secondary | ICD-10-CM | POA: Diagnosis not present

## 2020-02-07 DIAGNOSIS — E11621 Type 2 diabetes mellitus with foot ulcer: Secondary | ICD-10-CM | POA: Diagnosis not present

## 2020-02-07 NOTE — Telephone Encounter (Signed)
Informed patients daughter, [name] of the recommendations below. Informed her that we would give her a call back with any new advisements regarding the ASA from [name]. Harl Bowie.   Rodriguez-Guzman, Raquel, RPH-CPP 3 minutes ago (3:54 PM)       CHA2DS2-VASc Score = 5  This indicates a 7.2% annual risk of stroke. The patient's score is based upon: CHF History: 0 HTN History: 1 Diabetes History: 1 Stroke History: 0 Vascular Disease History: 1 Age Score: 2 Gender Score: 0    Okay to HOLD Eliquis for 48 hours. Recommend urgent care or PCP for wound assessment and wound care.  Will need [name]/cardiologist input to hold aspirin.

## 2020-02-07 NOTE — Telephone Encounter (Signed)
Called Kathlee Nations back, gave her verbal order for wound dressing/changes, and told to bring him in if needed.

## 2020-02-07 NOTE — Telephone Encounter (Signed)
Patient fell in his bathroom Saturday. Skin tears bilateral arms, one steadily dripping. Xeroform gauze and dry gauze applied. Wants order to change every 3 days.   Kathlee Nations at [facility]  [phone]

## 2020-02-07 NOTE — Telephone Encounter (Signed)
Cathy-daughter called stating that her father fell into bathtub 02-03-2020 Has skin tears on his arms. Daughter states that he continues to bleed. Was seen today by Home Health Nurse.She suggested calling Dr. Harl Bowie to see if patient can stop Eliquis and baby ASA for a few days.  [phone] (daughter)

## 2020-02-07 NOTE — Telephone Encounter (Signed)
Okay to give order, to bring in for evaluation if needed

## 2020-02-07 NOTE — Telephone Encounter (Signed)
  CHA2DS2-VASc Score = 5  This indicates a 7.2% annual risk of stroke. The patient's score is based upon: CHF History: 0 HTN History: 1 Diabetes History: 1 Stroke History: 0 Vascular Disease History: 1 Age Score: 2 Gender Score: 0    Okay to HOLD Eliquis for 48 hours. Recommend urgent care or PCP for wound assessment and wound care.  Will need Dr Branch/cardiologist input to hold aspirin.

## 2020-02-08 ENCOUNTER — Encounter (HOSPITAL_BASED_OUTPATIENT_CLINIC_OR_DEPARTMENT_OTHER): Payer: Medicare Other | Admitting: Internal Medicine

## 2020-02-08 DIAGNOSIS — C44319 Basal cell carcinoma of skin of other parts of face: Secondary | ICD-10-CM | POA: Diagnosis not present

## 2020-02-08 NOTE — Telephone Encounter (Signed)
The aspirin is not for a cardiac condition but his severe peripheral vascular disease that vascular surgery has followed, I would suggest staying on aspirin, ok to hold eliquis the 48 hrs   Carlyle Dolly MD

## 2020-02-08 NOTE — Telephone Encounter (Signed)
Pt's daughter notified and voiced understanding.

## 2020-02-12 DIAGNOSIS — I4821 Permanent atrial fibrillation: Secondary | ICD-10-CM | POA: Diagnosis not present

## 2020-02-12 DIAGNOSIS — L0390 Cellulitis, unspecified: Secondary | ICD-10-CM | POA: Diagnosis not present

## 2020-02-12 DIAGNOSIS — L97512 Non-pressure chronic ulcer of other part of right foot with fat layer exposed: Secondary | ICD-10-CM | POA: Diagnosis not present

## 2020-02-12 DIAGNOSIS — C8581 Other specified types of non-Hodgkin lymphoma, lymph nodes of head, face, and neck: Secondary | ICD-10-CM | POA: Diagnosis not present

## 2020-02-12 DIAGNOSIS — E1142 Type 2 diabetes mellitus with diabetic polyneuropathy: Secondary | ICD-10-CM | POA: Diagnosis not present

## 2020-02-12 DIAGNOSIS — Z7901 Long term (current) use of anticoagulants: Secondary | ICD-10-CM | POA: Diagnosis not present

## 2020-02-12 DIAGNOSIS — E1151 Type 2 diabetes mellitus with diabetic peripheral angiopathy without gangrene: Secondary | ICD-10-CM | POA: Diagnosis not present

## 2020-02-12 DIAGNOSIS — E11621 Type 2 diabetes mellitus with foot ulcer: Secondary | ICD-10-CM | POA: Diagnosis not present

## 2020-02-12 DIAGNOSIS — I10 Essential (primary) hypertension: Secondary | ICD-10-CM | POA: Diagnosis not present

## 2020-02-12 DIAGNOSIS — Z7984 Long term (current) use of oral hypoglycemic drugs: Secondary | ICD-10-CM | POA: Diagnosis not present

## 2020-02-12 DIAGNOSIS — L97514 Non-pressure chronic ulcer of other part of right foot with necrosis of bone: Secondary | ICD-10-CM | POA: Diagnosis not present

## 2020-02-12 DIAGNOSIS — Z89422 Acquired absence of other left toe(s): Secondary | ICD-10-CM | POA: Diagnosis not present

## 2020-02-12 DIAGNOSIS — C61 Malignant neoplasm of prostate: Secondary | ICD-10-CM | POA: Diagnosis not present

## 2020-02-13 ENCOUNTER — Encounter (HOSPITAL_BASED_OUTPATIENT_CLINIC_OR_DEPARTMENT_OTHER): Payer: Medicare Other | Admitting: Internal Medicine

## 2020-02-13 ENCOUNTER — Other Ambulatory Visit: Payer: Self-pay

## 2020-02-13 DIAGNOSIS — E1151 Type 2 diabetes mellitus with diabetic peripheral angiopathy without gangrene: Secondary | ICD-10-CM | POA: Diagnosis not present

## 2020-02-13 DIAGNOSIS — L97514 Non-pressure chronic ulcer of other part of right foot with necrosis of bone: Secondary | ICD-10-CM | POA: Diagnosis not present

## 2020-02-13 DIAGNOSIS — S40811A Abrasion of right upper arm, initial encounter: Secondary | ICD-10-CM | POA: Diagnosis not present

## 2020-02-13 DIAGNOSIS — C8581 Other specified types of non-Hodgkin lymphoma, lymph nodes of head, face, and neck: Secondary | ICD-10-CM | POA: Diagnosis not present

## 2020-02-13 DIAGNOSIS — L89610 Pressure ulcer of right heel, unstageable: Secondary | ICD-10-CM | POA: Diagnosis not present

## 2020-02-13 DIAGNOSIS — E1142 Type 2 diabetes mellitus with diabetic polyneuropathy: Secondary | ICD-10-CM | POA: Diagnosis not present

## 2020-02-13 DIAGNOSIS — S40812A Abrasion of left upper arm, initial encounter: Secondary | ICD-10-CM | POA: Diagnosis not present

## 2020-02-13 DIAGNOSIS — C61 Malignant neoplasm of prostate: Secondary | ICD-10-CM | POA: Diagnosis not present

## 2020-02-13 DIAGNOSIS — E11621 Type 2 diabetes mellitus with foot ulcer: Secondary | ICD-10-CM | POA: Diagnosis not present

## 2020-02-13 DIAGNOSIS — Z89412 Acquired absence of left great toe: Secondary | ICD-10-CM | POA: Diagnosis not present

## 2020-02-13 NOTE — Progress Notes (Signed)
LAYMOND, POSTLE ([id number]) Visit Report for 02/13/2020 Fall Risk Assessment Details Patient Name: Date of Service: [name] 02/13/2020 3:15 PM Medical Record Number: [id number] Patient Account Number: [id number] Date of Birth/Sex: Treating RN: 08/03/1923 (86 y.o. [name] Primary Care Tulsi Crossett: Sherrie Mustache Other Clinician: Referring Talley Kreiser: Treating Lander Eslick/Extender: Deloria Lair in Treatment: 29 Fall Risk Assessment Items Have you had 2 or more falls in the last 12 monthso 0 Yes Have you had any fall that resulted in injury in the last 12 monthso 0 Yes FALLS RISK SCREEN History of falling - immediate or within 3 months 25 Yes Secondary diagnosis (Do you have 2 or more medical diagnoseso) 15 Yes Ambulatory aid None/bed rest/wheelchair/nurse 0 Yes Crutches/cane/walker 0 No Furniture 0 No Intravenous therapy Access/Saline/Heparin Lock 0 No Gait/Transferring Normal/ bed rest/ wheelchair 0 No Weak (short steps with or without shuffle, stooped but able to lift head while walking, may seek 10 Yes support from furniture) Impaired (short steps with shuffle, may have difficulty arising from chair, head down, impaired 0 No balance) Mental Status Oriented to own ability 0 Yes Electronic Signature(s) Signed: 02/13/2020 5:15:49 PM By: Levan Hurst RN, BSN Entered By: Levan Hurst on 02/13/2020 16:03:29

## 2020-02-14 ENCOUNTER — Ambulatory Visit: Payer: Medicare Other | Admitting: Nurse Practitioner

## 2020-02-15 ENCOUNTER — Encounter (HOSPITAL_COMMUNITY): Payer: Self-pay

## 2020-02-15 DIAGNOSIS — E1151 Type 2 diabetes mellitus with diabetic peripheral angiopathy without gangrene: Secondary | ICD-10-CM | POA: Diagnosis not present

## 2020-02-15 DIAGNOSIS — C61 Malignant neoplasm of prostate: Secondary | ICD-10-CM | POA: Diagnosis not present

## 2020-02-15 DIAGNOSIS — L97514 Non-pressure chronic ulcer of other part of right foot with necrosis of bone: Secondary | ICD-10-CM | POA: Diagnosis not present

## 2020-02-15 DIAGNOSIS — E1142 Type 2 diabetes mellitus with diabetic polyneuropathy: Secondary | ICD-10-CM | POA: Diagnosis not present

## 2020-02-15 DIAGNOSIS — E11621 Type 2 diabetes mellitus with foot ulcer: Secondary | ICD-10-CM | POA: Diagnosis not present

## 2020-02-15 DIAGNOSIS — C8581 Other specified types of non-Hodgkin lymphoma, lymph nodes of head, face, and neck: Secondary | ICD-10-CM | POA: Diagnosis not present

## 2020-02-15 NOTE — Progress Notes (Signed)
RICHIE, BONANNO ([id number]) Visit Report for 02/13/2020 Arrival Information Details Patient Name: Date of Service: [name] 02/13/2020 3:15 PM Medical Record Number: [id number] Patient Account Number: [id number] Date of Birth/Sex: Treating RN: 1923/12/04 (86 y.o. Lorette Ang, Meta.Reding Primary Care Ayden Apodaca: Sherrie Mustache Other Clinician: Referring Leamon Palau: Treating Chamaine Stankus/Extender: Deloria Lair in Treatment: 29 Visit Information History Since Last Visit Added or deleted any medications: No Patient Arrived: Wheel Chair Any new allergies or adverse reactions: No Arrival Time: 15:32 Had a fall or experienced change in Yes Accompanied By: family member and activities of daily living that may affect caregiver risk of falls: Transfer Assistance: None Signs or symptoms of abuse/neglect since last visito No Patient Identification Verified: Yes Hospitalized since last visit: No Secondary Verification Process Completed: Yes Implantable device outside of the clinic excluding No Patient Requires Transmission-Based No cellular tissue based products placed in the center Precautions: since last visit: Patient Has Alerts: Yes Has Dressing in Place as Prescribed: Yes Patient Alerts: R ABI non compressible Has Footwear/Offloading in Place as Prescribed: Yes Right: Wedge Shoe Pain Present Now: Yes Notes per family member and care giver patient fell Saturday passed out on toilet fell over into bathtub. x2 skin tears to bilateral arms. Per caregiver PCP caring for arms yesterday and will return on Friday to PCP for follow up. MD placed him on doxycycline. Patient in today r/t right heel. MD made aware. Electronic Signature(s) Signed: 02/15/2020 5:24:43 PM By: Deon Pilling Entered By: Deon Pilling on 02/13/2020 16:18:51 -------------------------------------------------------------------------------- Clinic Level of Care Assessment Details Patient Name:  Date of Service: Michaele Offer 02/13/2020 3:15 PM Medical Record Number: [id number] Patient Account Number: [id number] Date of Birth/Sex: Treating RN: [birth year] (86 y.o. Lorette Ang, Tammi Klippel Primary Care Veronica Fretz: Sherrie Mustache Other Clinician: Referring Chanler Mendonca: Treating Harvir Patry/Extender: Deloria Lair in Treatment: 29 Clinic Level of Care Assessment Items TOOL 3 Quantity Score X- 1 0 Use when EandM and Procedure is performed on FOLLOW-UP visit ASSESSMENTS - Nursing Assessment / Reassessment X- 1 10 Reassessment of Co-morbidities (includes updates in patient status) X- 1 5 Reassessment of Adherence to Treatment Plan ASSESSMENTS - Wound and Skin Assessment / Reassessment []  - Points for Wound Assessment can only be taken for a new wound of unknown or different etiology and a procedure is 0 NOT performed to that wound []  - 0 Simple Wound Assessment / Reassessment - one wound X- 2 5 Complex Wound Assessment / Reassessment - multiple wounds X- 1 10 Dermatologic / Skin Assessment (not related to wound area) ASSESSMENTS - Focused Assessment []  - 0 Circumferential Edema Measurements - multi extremities X- 1 10 Nutritional Assessment / Counseling / Intervention []  - 0 Lower Extremity Assessment (monofilament, tuning fork, pulses) []  - 0 Peripheral Arterial Disease Assessment (using hand held doppler) ASSESSMENTS - Ostomy and/or Continence Assessment and Care []  - 0 Incontinence Assessment and Management []  - 0 Ostomy Care Assessment and Management (repouching, etc.) PROCESS - Coordination of Care []  - Points for Discharge Coordination can only be taken for a new wound of unknown or different etiology and a procedure 0 is NOT performed to that wound []  - 0 Simple Patient / Family Education for ongoing care X- 1 20 Complex (extensive) Patient / Family Education for ongoing care X- 1 10 Staff obtains Programmer, systems, Records, T Results / Process  Orders est X- 1 10 Staff telephones HHA, Nursing Homes / Clarify orders / etc []  - 0 Routine Transfer  to another Facility (non-emergent condition) []  - 0 Routine Hospital Admission (non-emergent condition) []  - 0 New Admissions / Insurance Authorizations / Ordering NPWT Apligraf, etc. , []  - 0 Emergency Hospital Admission (emergent condition) []  - 0 Simple Discharge Coordination X- 1 15 Complex (extensive) Discharge Coordination PROCESS - Special Needs []  - 0 Pediatric / Minor Patient Management []  - 0 Isolation Patient Management []  - 0 Hearing / Language / Visual special needs []  - 0 Assessment of Community assistance (transportation, D/C planning, etc.) []  - 0 Additional assistance / Altered mentation []  - 0 Support Surface(s) Assessment (bed, cushion, seat, etc.) INTERVENTIONS - Wound Cleansing / Measurement []  - Points for Wound Cleaning / Measurement, Wound Dressing, Specimen Collection and Specimen taken to lab can only 0 be taken for a new wound of unknown or different etiology and a procedure is NOT performed to that wound []  - 0 Simple Wound Cleansing - one wound X- 2 5 Complex Wound Cleansing - multiple wounds X- 1 5 Wound Imaging (photographs - any number of wounds) []  - 0 Wound Tracing (instead of photographs) []  - 0 Simple Wound Measurement - one wound X- 2 5 Complex Wound Measurement - multiple wounds INTERVENTIONS - Wound Dressings X - Small Wound Dressing one or multiple wounds 2 10 []  - 0 Medium Wound Dressing one or multiple wounds []  - 0 Large Wound Dressing one or multiple wounds INTERVENTIONS - Miscellaneous []  - 0 External ear exam []  - 0 Specimen Collection (cultures, biopsies, blood, body fluids, etc.) []  - 0 Specimen(s) / Culture(s) sent or taken to Lab for analysis []  - 0 Patient Transfer (multiple staff / Civil Service fast streamer / Similar devices) []  - 0 Simple Staple / Suture removal (25 or less) []  - 0 Complex Staple / Suture removal  (26 or more) []  - 0 Hypo / Hyperglycemic Management (close monitor of Blood Glucose) []  - 0 Ankle / Brachial Index (ABI) - do not check if billed separately X- 1 5 Vital Signs Has the patient been seen at the hospital within the last three years: Yes Total Score: 150 Level Of Care: New/Established - Level 4 Electronic Signature(s) Signed: 02/15/2020 5:24:43 PM By: Deon Pilling Entered By: Deon Pilling on 02/13/2020 17:13:09 -------------------------------------------------------------------------------- Encounter Discharge Information Details Patient Name: Date of Service: [name] 02/13/2020 3:15 PM Medical Record Number: [id number] Patient Account Number: [id number] Date of Birth/Sex: Treating RN: 1924-03-27 (86 y.o. [name] Primary Care Reginaldo Hazard: Sherrie Mustache Other Clinician: Referring Eryx Zane: Treating Tationna Fullard/Extender: Deloria Lair in Treatment: 29 Encounter Discharge Information Items Post Procedure Vitals Discharge Condition: Stable Temperature (F): 99 Ambulatory Status: Wheelchair Pulse (bpm): 86 Discharge Destination: Home Respiratory Rate (breaths/min): 18 Transportation: Private Auto Blood Pressure (mmHg): 141/72 Accompanied By: caregivers Schedule Follow-up Appointment: Yes Clinical Summary of Care: Patient Declined Electronic Signature(s) Signed: 02/13/2020 5:15:49 PM By: Levan Hurst RN, BSN Entered By: Levan Hurst on 02/13/2020 17:12:30 -------------------------------------------------------------------------------- Lower Extremity Assessment Details Patient Name: Date of Service: [name] 02/13/2020 3:15 PM Medical Record Number: [id number] Patient Account Number: [id number] Date of Birth/Sex: Treating RN: 1924/02/09 (86 y.o. [name] Primary Care Chesky Heyer: Sherrie Mustache Other Clinician: Referring Daneka Lantigua: Treating Vedder Brittian/Extender: Deloria Lair in  Treatment: 29 Edema Assessment Assessed: Shirlyn Goltz: No] Patrice Paradise: Yes] Edema: [Left: N] [Right: o] Calf Left: Right: Point of Measurement: From Medial Instep 27.5 cm Ankle Left: Right: Point of Measurement: From Medial Instep 21 cm Electronic Signature(s) Signed: 02/15/2020 5:24:43 PM By:  Deaton, Bobbi Entered By: Deon Pilling on 02/13/2020 15:43:01 -------------------------------------------------------------------------------- Multi Wound Chart Details Patient Name: Date of Service: [place] Doylene Canard 02/13/2020 3:15 PM Medical Record Number: [id number] Patient Account Number: [id number] Date of Birth/Sex: Treating RN: May 16, 1923 (86 y.o. Lorette Ang, Meta.Reding Primary Care Anitha Kreiser: Sherrie Mustache Other Clinician: Referring Imanni Burdine: Treating Mollie Rossano/Extender: Deloria Lair in Treatment: 29 Vital Signs Height(in): 70 Capillary Blood Glucose(mg/dl): 137 Weight(lbs): 140 Pulse(bpm): 53 Body Mass Index(BMI): 20 Blood Pressure(mmHg): 141/72 Temperature(F): 99 Respiratory Rate(breaths/min): 18 Photos: [2:No Photos Right T Great oe] [3:No Photos Right Calcaneus] [4:No Photos Right Forearm] Wound Location: [2:Gradually Appeared] [3:Gradually Appeared] [4:Trauma] Wounding Event: [2:Diabetic Wound/Ulcer of the Lower] [3:Pressure Ulcer] [4:Skin Tear] Primary Etiology: [2:Extremity N/A] [3:Arterial Insufficiency Ulcer] [4:N/A] Secondary Etiology: [2:N/A] [3:N/A] [4:Cataracts, Arrhythmia, Hypertension,] Comorbid History: [2:06/15/2019] [3:09/21/2019] [4:Peripheral Arterial Disease, Type II Diabetes, Osteomyelitis, Dementia, Neuropathy, Received Chemotherapy 02/11/2019] Date A cquired: [2:29] [3:17] [4:0] Weeks of Treatment: [2:Open] [3:Open] [4:Open] Wound Status: [2:0.8x1x0.2] [3:2.5x6.3x0.1] [4:3x1x0.1] Measurements L x W x D (cm) [2:0.628] [3:12.37] [4:2.356] A (cm) : rea [2:0.126] [3:1.237] [4:0.236] Volume (cm) : [2:-568.10%] [3:71.20%] [4:N/A] %  Reduction in A [2:rea: -350.00%] [3:71.20%] [4:N/A] % Reduction in Volume: [2:Grade 2] [3:Unstageable/Unclassified] [4:Partial Thickness] Classification: [2:N/A] [3:N/A] [4:Medium] Exudate A mount: [2:N/A] [3:N/A] [4:Serosanguineous] Exudate Type: [2:N/A] [3:N/A] [4:red, brown] Exudate Color: [2:N/A] [3:N/A] [4:Flat and Intact] Wound Margin: [2:N/A] [3:N/A] [4:Large (67-100%)] Granulation A mount: [2:N/A] [3:N/A] [4:Red] Granulation Quality: [2:N/A] [3:N/A] [4:None Present (0%)] Necrotic A mount: [2:N/A] [3:N/A] [4:None] Epithelialization: [2:N/A] [3:Debridement - Selective/Open Wound N/A] Debridement: [2:N/A] [3:16:12] [4:N/A] Pre-procedure Verification/Time Out Taken: [2:N/A] [3:Lidocaine 4% Topical Solution] [4:N/A] Pain Control: [2:N/A] [3:Necrotic/Eschar] [4:N/A] Tissue Debrided: [2:N/A] [3:Non-Viable Tissue] [4:N/A] Level: [2:N/A] [3:15.75] [4:N/A] Debridement A (sq cm): [2:rea N/A] [3:Blade, Forceps] [4:N/A] Instrument: [2:N/A] [3:Minimum] [4:N/A] Bleeding: [2:N/A] [3:Pressure] [4:N/A] Hemostasis A chieved: [2:N/A] [3:0] [4:N/A] Procedural Pain: [2:N/A] [3:0] [4:N/A] Post Procedural Pain: [2:N/A] [3:Procedure was tolerated well] [4:N/A] Debridement Treatment Response: [2:N/A] [3:2.5x6.3x0.1] [4:N/A] Post Debridement Measurements L x W x D (cm) [2:N/A] [3:1.237] [4:N/A] Post Debridement Volume: (cm) [2:N/A] [3:Unstageable/Unclassified] [4:N/A] Post Debridement Stage: [2:N/A] [3:Debridement] [4:N/A] Wound Number: 5 N/A N/A Photos: No Photos N/A N/A Left Forearm N/A N/A Wound Location: Trauma N/A N/A Wounding Event: Skin T ear N/A N/A Primary Etiology: N/A N/A N/A Secondary Etiology: Cataracts, Arrhythmia, Hypertension, N/A N/A Comorbid History: Peripheral Arterial Disease, Type II Diabetes, Osteomyelitis, Dementia, Neuropathy, Received Chemotherapy 02/11/2019 N/A N/A Date A cquired: 0 N/A N/A Weeks of Treatment: Open N/A N/A Wound Status: 7x3.4x0.1 N/A  N/A Measurements L x W x D (cm) 18.692 N/A N/A A (cm) : rea 1.869 N/A N/A Volume (cm) : N/A N/A N/A % Reduction in A rea: N/A N/A N/A % Reduction in Volume: Partial Thickness N/A N/A Classification: Medium N/A N/A Exudate A mount: Serosanguineous N/A N/A Exudate Type: red, brown N/A N/A Exudate Color: Flat and Intact N/A N/A Wound Margin: Large (67-100%) N/A N/A Granulation A mount: Red N/A N/A Granulation Quality: None Present (0%) N/A N/A Necrotic A mount: Fascia: No N/A N/A Exposed Structures: Fat Layer (Subcutaneous Tissue): No Tendon: No Muscle: No Joint: No Bone: No None N/A N/A Epithelialization: N/A N/A N/A Debridement: N/A N/A N/A Pain Control: N/A N/A N/A Tissue Debrided: N/A N/A N/A Level: N/A N/A N/A Debridement A (sq cm): rea N/A N/A N/A Instrument: N/A N/A N/A Bleeding: N/A N/A N/A Hemostasis A chieved: N/A N/A N/A Procedural Pain: N/A N/A N/A Post Procedural Pain: Debridement Treatment Response: N/A N/A N/A Post  Debridement Measurements L x N/A N/A N/A W x D (cm) N/A N/A N/A Post Debridement Volume: (cm) N/A N/A N/A Post Debridement Stage: N/A N/A N/A Procedures Performed: Treatment Notes Electronic Signature(s) Signed: 02/13/2020 4:47:47 PM By: Linton Ham MD Signed: 02/15/2020 5:24:43 PM By: Deon Pilling Entered By: Linton Ham on 02/13/2020 16:34:48 -------------------------------------------------------------------------------- Multi-Disciplinary Care Plan Details Patient Name: Date of Service: [name] 02/13/2020 3:15 PM Medical Record Number: [id number] Patient Account Number: [id number] Date of Birth/Sex: Treating RN: [birth year] (86 y.o. [name] Primary Care Loree Shehata: Sherrie Mustache Other Clinician: Referring Tyarra Nolton: Treating Monserath Neff/Extender: Deloria Lair in Treatment: 29 Active Inactive Wound/Skin Impairment Nursing Diagnoses: Knowledge deficit  related to ulceration/compromised skin integrity Goals: Patient/caregiver will verbalize understanding of skin care regimen Date Initiated: 07/25/2019 Target Resolution Date: 03/22/2020 Goal Status: Active Ulcer/skin breakdown will have a volume reduction of 30% by week 4 Date Initiated: 07/25/2019 Date Inactivated: 08/28/2019 Target Resolution Date: 08/25/2019 Goal Status: Unmet Unmet Reason: Osteomyelitis Interventions: Assess patient/caregiver ability to obtain necessary supplies Assess patient/caregiver ability to perform ulcer/skin care regimen upon admission and as needed Assess ulceration(s) every visit Notes: Electronic Signature(s) Signed: 02/15/2020 5:24:43 PM By: Deon Pilling Entered By: Deon Pilling on 02/13/2020 15:29:19 -------------------------------------------------------------------------------- Pain Assessment Details Patient Name: Date of Service: [name] 02/13/2020 3:15 PM Medical Record Number: [id number] Patient Account Number: [id number] Date of Birth/Sex: Treating RN: 20-Jun-1923 (86 y.o. [name] Primary Care Severina Sykora: Sherrie Mustache Other Clinician: Referring Lochlin Eppinger: Treating Melisha Eggleton/Extender: Deloria Lair in Treatment: 29 Active Problems Location of Pain Severity and Description of Pain Patient Has Paino No Site Locations Rate the pain. Current Pain Level: 0 Pain Management and Medication Current Pain Management: Medication: No Cold Application: No Rest: No Massage: No Activity: No T.E.N.S.: No Heat Application: No Leg drop or elevation: No Is the Current Pain Management Adequate: Adequate How does your wound impact your activities of daily livingo Sleep: No Bathing: No Appetite: No Relationship With Others: No Bladder Continence: No Emotions: No Bowel Continence: No Work: No Toileting: No Drive: No Dressing: No Hobbies: No Electronic Signature(s) Signed: 02/15/2020 5:24:43 PM By:  Deon Pilling Entered By: Deon Pilling on 02/13/2020 15:42:29 -------------------------------------------------------------------------------- Patient/Caregiver Education Details Patient Name: Date of Service: [place] Doylene Canard 10/26/2021andnbsp3:15 PM Medical Record Number: [id number] Patient Account Number: [id number] Date of Birth/Gender: Treating RN: 01/16/1924 (86 y.o. [name] Primary Care Physician: Sherrie Mustache Other Clinician: Referring Physician: Treating Physician/Extender: Deloria Lair in Treatment: 29 Education Assessment Education Provided To: Patient Education Topics Provided Wound/Skin Impairment: Handouts: Skin Care Do's and Dont's Methods: Explain/Verbal Responses: Reinforcements needed Electronic Signature(s) Signed: 02/15/2020 5:24:43 PM By: Deon Pilling Entered By: Deon Pilling on 02/13/2020 15:29:32 -------------------------------------------------------------------------------- Wound Assessment Details Patient Name: Date of Service: [name] 02/13/2020 3:15 PM Medical Record Number: [id number] Patient Account Number: [id number] Date of Birth/Sex: Treating RN: [birth year] (86 y.o. [name] Primary Care Wandell Scullion: Sherrie Mustache Other Clinician: Referring Kenith Trickel: Treating Deidre Carino/Extender: Deloria Lair in Treatment: 29 Wound Status Wound Number: 2 Primary Etiology: Diabetic Wound/Ulcer of the Lower Extremity Wound Location: Right T Great oe Wound Status: Open Wounding Event: Gradually Appeared Date Acquired: 06/15/2019 Weeks Of Treatment: 29 Clustered Wound: No Photos Photo Uploaded By: Mikeal Hawthorne on 02/14/2020 11:44:41 Wound Measurements Length: (cm) 0.8 Width: (cm) 1 Depth: (cm) 0.2 Area: (cm) 0.628 Volume: (cm) 0.126 % Reduction in Area: -568.1% % Reduction in Volume: -  350% Wound Description Classification: Grade 2 Treatment Notes Wound #2  (Right Toe Great) 1. Cleanse With Wound Cleanser 3. Primary Dressing Applied Colle[employer] 4. Secondary Dressing Dry Gauze Roll Gauze 5. Secured With Tape Notes heel offloading sandal Electronic Signature(s) Signed: 02/15/2020 5:24:43 PM By: Deon Pilling Entered By: Deon Pilling on 02/13/2020 15:47:57 -------------------------------------------------------------------------------- Wound Assessment Details Patient Name: Date of Service: [place] Doylene Canard 02/13/2020 3:15 PM Medical Record Number: [id number] Patient Account Number: [id number] Date of Birth/Sex: Treating RN: [birth year] (86 y.o. [name] Primary Care Montae Stager: Sherrie Mustache Other Clinician: Referring Shaquisha Wynn: Treating Netanel Yannuzzi/Extender: Deloria Lair in Treatment: 29 Wound Status Wound Number: 3 Primary Etiology: Pressure Ulcer Wound Location: Right Calcaneus Secondary Etiology: Arterial Insufficiency Ulcer Wounding Event: Gradually Appeared Wound Status: Open Date Acquired: 09/21/2019 Weeks Of Treatment: 17 Clustered Wound: No Photos Photo Uploaded By: Mikeal Hawthorne on 02/14/2020 11:44:41 Wound Measurements Length: (cm) 2.5 Width: (cm) 6.3 Depth: (cm) 0.1 Area: (cm) 12.37 Volume: (cm) 1.237 % Reduction in Area: 71.2% % Reduction in Volume: 71.2% Wound Description Classification: Unstageable/Unclassified Treatment Notes Wound #3 (Right Calcaneus) 1. Cleanse With Wound Cleanser 3. Primary Dressing Applied Iodoflex 4. Secondary Dressing Dry Gauze Roll Gauze Heel Cup Electronic Signature(s) Signed: 02/15/2020 5:24:43 PM By: Deon Pilling Entered By: Deon Pilling on 02/13/2020 15:47:57 -------------------------------------------------------------------------------- Wound Assessment Details Patient Name: Date of Service: MA Doylene Canard 02/13/2020 3:15 PM Medical Record Number: [id number] Patient Account Number: [id number] Date of  Birth/Sex: Treating RN: [birth year] (86 y.o. [name] Primary Care Quentez Lober: Sherrie Mustache Other Clinician: Referring Gailen Venne: Treating Tanush Drees/Extender: Deloria Lair in Treatment: 29 Wound Status Wound Number: 4 Primary Skin Tear Etiology: Wound Location: Right Forearm Wound Open Wounding Event: Trauma Status: Date Acquired: 02/11/2019 Comorbid Cataracts, Arrhythmia, Hypertension, Peripheral Arterial Disease, Weeks Of Treatment: 0 History: Type II Diabetes, Osteomyelitis, Dementia, Neuropathy, Received Clustered Wound: No Chemotherapy Wound Measurements Length: (cm) 3 Width: (cm) 1 Depth: (cm) 0.1 Area: (cm) 2.356 Volume: (cm) 0.236 Wound Description Classification: Partial Thickness Wound Margin: Flat and Intact Exudate Amount: Medium Exudate Type: Serosanguineous Exudate Color: red, brown Foul Odor After Cleansing: Slough/Fibrino % Reduction in Area: % Reduction in Volume: Epithelialization: None Tunneling: No Undermining: No No No Wound Bed Granulation Amount: Large (67-100%) Exposed Structure Granulation Quality: Red Fascia Exposed: No Necrotic Amount: None Present (0%) Fat Layer (Subcutaneous Tissue) Exposed: No Tendon Exposed: No Muscle Exposed: No Joint Exposed: No Bone Exposed: No Treatment Notes Wound #4 (Right Forearm) 1. Cleanse With Wound Cleanser 3. Primary Dressing Applied Xeroform Gauze 4. Secondary Dressing Dry Gauze Roll Gauze 5. Secured With Recruitment consultant) Signed: 02/13/2020 5:15:49 PM By: Levan Hurst RN, BSN Entered By: Levan Hurst on 02/13/2020 16:02:12 -------------------------------------------------------------------------------- Wound Assessment Details Patient Name: Date of Service: [name] 02/13/2020 3:15 PM Medical Record Number: [id number] Patient Account Number: [id number] Date of Birth/Sex: Treating RN: 05/07/1923 (86 y.o. [name] Primary Care Esbeydi Manago: Sherrie Mustache Other Clinician: Referring Caelen Reierson: Treating Jaylise Peek/Extender: Deloria Lair in Treatment: 29 Wound Status Wound Number: 5 Primary Skin Tear Etiology: Wound Location: Left Forearm Wound Open Wounding Event: Trauma Status: Date Acquired: 02/11/2019 Comorbid Cataracts, Arrhythmia, Hypertension, Peripheral Arterial Disease, Weeks Of Treatment: 0 History: Type II Diabetes, Osteomyelitis, Dementia, Neuropathy, Received Clustered Wound: No Chemotherapy Wound Measurements Length: (cm) 7 Width: (cm) 3.4 Depth: (cm) 0.1 Area: (cm) 18.692 Volume: (cm) 1.869 % Reduction in Area: % Reduction in Volume: Epithelialization: None Tunneling: No Undermining:  No Wound Description Classification: Partial Thickness Wound Margin: Flat and Intact Exudate Amount: Medium Exudate Type: Serosanguineous Exudate Color: red, brown Wound Bed Granulation Amount: Large (67-100%) Granulation Quality: Red Necrotic Amount: None Present (0%) Foul Odor After Cleansing: No Slough/Fibrino No Exposed Structure Fascia Exposed: No Fat Layer (Subcutaneous Tissue) Exposed: No Tendon Exposed: No Muscle Exposed: No Joint Exposed: No Bone Exposed: No Treatment Notes Wound #5 (Left Forearm) 1. Cleanse With Wound Cleanser 3. Primary Dressing Applied Xeroform Gauze 4. Secondary Dressing Dry Gauze Roll Gauze 5. Secured With Recruitment consultant) Signed: 02/13/2020 5:15:49 PM By: Levan Hurst RN, BSN Entered By: Levan Hurst on 02/13/2020 16:03:02 -------------------------------------------------------------------------------- [place] Details Patient Name: Date of Service: [name] 02/13/2020 3:15 PM Medical Record Number: [id number] Patient Account Number: [id number] Date of Birth/Sex: Treating RN: 07-Aug-1923 (86 y.o. [name] Primary Care Briann Sarchet: Sherrie Mustache Other Clinician: Referring  Chasin Findling: Treating Amora Sheehy/Extender: Deloria Lair in Treatment: 29 Vital Signs Time Taken: 15:30 Temperature (F): 99 Height (in): 70 Pulse (bpm): 86 Weight (lbs): 140 Respiratory Rate (breaths/min): 18 Body Mass Index (BMI): 20.1 Blood Pressure (mmHg): 141/72 Capillary Blood Glucose (mg/dl): 137 Reference Range: 80 - 120 mg / dl Electronic Signature(s) Signed: 02/15/2020 5:24:43 PM By: Deon Pilling Entered By: Deon Pilling on 02/13/2020 15:42:20

## 2020-02-15 NOTE — Progress Notes (Signed)
ELDRA, WORD ([id number]) Visit Report for 02/13/2020 Debridement Details Patient Name: Date of Service: MA Doylene Canard 02/13/2020 3:15 PM Medical Record Number: [id number] Patient Account Number: [id number] Date of Birth/Sex: Treating RN: [birth year] (86 y.o. Lorette Ang, Meta.Reding Primary Care Provider: Sherrie Mustache Other Clinician: Referring Provider: Treating Provider/Extender: Deloria Lair in Treatment: 29 Debridement Performed for Assessment: Wound #3 Right Calcaneus Performed By: Physician Ricard Dillon., MD Debridement Type: Debridement Severity of Tissue Pre Debridement: Fat layer exposed Level of Consciousness (Pre-procedure): Awake and Alert Pre-procedure Verification/Time Out Yes - 16:12 Taken: Start Time: 16:13 Pain Control: Lidocaine 4% T opical Solution T Area Debrided (L x W): otal 2.5 (cm) x 6.3 (cm) = 15.75 (cm) Tissue and other material debrided: Non-Viable, Eschar Level: Non-Viable Tissue Debridement Description: Selective/Open Wound Instrument: Blade, Forceps Bleeding: Minimum Hemostasis Achieved: Pressure End Time: 16:21 Procedural Pain: 0 Post Procedural Pain: 0 Response to Treatment: Procedure was tolerated well Level of Consciousness (Post- Awake and Alert procedure): Post Debridement Measurements of Total Wound Length: (cm) 2.5 Stage: Unstageable/Unclassified Width: (cm) 6.3 Depth: (cm) 0.1 Volume: (cm) 1.237 Character of Wound/Ulcer Post Debridement: Improved Severity of Tissue Post Debridement: Fat layer exposed Post Procedure Diagnosis Same as Pre-procedure Electronic Signature(s) Signed: 02/13/2020 4:47:47 PM By: Linton Ham MD Signed: 02/15/2020 5:24:43 PM By: Deon Pilling Entered By: Deon Pilling on 02/13/2020 16:21:45 -------------------------------------------------------------------------------- HPI Details Patient Name: Date of Service: [place], [place] E. 02/13/2020 3:15 PM Medical Record  Number: [id number] Patient Account Number: [id number] Date of Birth/Sex: Treating RN: November 02, 1923 (86 y.o. [name] Primary Care Provider: Sherrie Mustache Other Clinician: Referring Provider: Treating Provider/Extender: Deloria Lair in Treatment: 29 History of Present Illness HPI Description: ADMISSION 05/02/2019 This is a 86 year old man accompanied by his daughter. He lives in [place] with his elderly wife. He has home caregivers as well. Apparently on February 28, 2019 they noted blood on his sock and noticed a wound on the tip of his right great toe. He has been cared for by Dr. Posey Pronto who is his podiatrist in [place]. Apparently they have been using Betadine to this area. The patient has a thick mycotic nail on the right as well which is embedded in the wound in some areas. Fortunately the patient has diabetic neuropathy and is not really experiencing any pain otherwise I think this would all be quite painful. The patient was previously treated in 2019 for wounds on the left foot. This included a fairly thorough vascular review by vein and vascular Dr. Oneida Alar. His ABIs at that time were noncompressible bilaterally TBI on the right was 0.88 with biphasic waveforms on the right and monophasic waveforms at the left. He ended up having amputations of the left first and second toes. He did have an angiogram on 06/25/2017 which showed on the right the right common femoral profundofemoral for Morris and superficial femoral arteries were widely patent the right popliteal was widely patent. Tibial vessels were not well opacified secondary to motion artifact however he was felt to have all 3 tibial vessels intact with some calcification and some areas of stenosis within the posterior tibial artery but relatively good flow to the right lower leg. He also had the left reviewed as well which was the source of the problem at that time. Past medical  history; prostate cancer on oral antiandrogen therapy with bone mets, GI stromal tumor in 2005, type 2 diabetes with peripheral neuropathy, hypertension, atrial fibrillation on Eliquis,  history of non-Hodgkin's lymphoma treated with CHOP in 2005. He had amputations of the left first and second toes secondary to osteomyelitis. Does not appear that he has had an x-ray of the right foot ABI in our clinic on the right was noncompressible [see full arterial discussion above]. 1/19; area on the tip of the right great toe under a very mycotic nail tip. We use silver alginate. 1/26; area of the tip of the right great toe under a very mycotic nail. A lot of this seems to have closed down we have been using silver alginate 2/9; the areas on the tip of the right great toe under a very mycotic nail. A lot of this continues to close down. I do not think there is a subungual problem. I am hopeful that this mycotic nail which is thick and raised will allow full epithelialization of the wound 2/23; this was a difficult area on the tip of his right great toe. Thick mycotic nail over the base of the wound. This appears to have closed down now. The nail itself is thick split with considerable subungual debris. READMISSION 07/25/2019 This is a 86 year old man that we cared for earlier this year in the clinic without a wound on the tip of his right great toe under a very mycotic toenail. He is a type II diabetic. His ABIs have been noncompressible however he has been felt to have adequate blood flow in the right to heal the wound. We were able to get this wound to close over as I understand things he followed with Dr. Caprice Beaver had some debridement of the nail everything looks fine and apparently the wound bed was washed off by his wife over the weekend and by Monday it was open. His daughter is able to show me pictures. Unfortunately this now has exposed bone raising the possibility that he has had underlying osteomyelitis  all along. He has been using Silvadene cream on this he has been referred back to the clinic by Dr. Caprice Beaver. He had x-rays of the right foot that did not show evidence of osteomyelitis in the right foot. He has not been on antibiotics He also had an x-ray of the left foot that showed findings suspicious for osteomyelitis involving the residual base of the first proximal phalanx and the proximal phalanx of the left second toe as well as the left second metatarsal head however he has no wounds in this area. 4/12; culture from last time of bone in the right great toe was negative. The patient's wife apparently scrubbed this toe vigorously and according to his daughter reopen the wound however unfortunately it is all exposed bone last time. I removed some of this and we use Prisma unfortunately the area does not look much better today. Previous x-rays done in podiatry office Dr. Caprice Beaver did not show evidence of osteomyelitis. Nevertheless I think osteomyelitis here is probably quite likely. Still using Prisma 4/26; he took 2 weeks of doxycycline although his daughter is telling me he is not eating. He has loose bowel movements however I did not get the sense that this is changed all that much. He still has exposed bone. Culture I did last time showed a few coag negative staph I do not think that this is necessarily relevant. I still am operating under the premise that the patient probably has osteomyelitis. After considerable difficulty we are able to determine he is not allergic to cephalosporins I therefore gave him Keflex. 5/10; is tolerating Keflex 500 3  times daily quite well. He still has the small wound in the tip of his toe with exposed bone.Marland Kitchen Unfortunately he does not really have any granulation tissue. He saw Dr. Caprice Beaver of podiatry in [place] last week I have not had a chance to look at his notes The patient had an extensive vascular work-up including an angiogram in 2019. He was felt to  have adequate blood supply on the right at the time although he did have amputations of the left first and second toe by Dr. Oneida Alar. 09/11/19 -Patient is back and is about to finish his Keflex, appointment with Dr. Oneida Alar is in the works. The right great toe wound looks about the same, the left third toe dorsal wound has healed. There was a small eschar covering it that was removed by podiatry.According to patient's daughter the right great toe base on the plantar surface had some redness that seems to have disappeared. 10/13/2019; patient is back in clinic today after 1 month hiatus although I have not seen him since 5/10. Since he was last here he was admitted to hospital from 6/2 through 6/5 because of a nonhealing wound on his right great toe. He underwent an angiogram which showed patent common femoral, profunda, SFA above and below-knee popliteal artery. The dominant runoff in the right lower extremity was through the anterior tibial but that became very small at the level of the ankle and had minimal outflow into the foot. The peroneal artery was patent but atretic. 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 Interventions were attempted on the posterior tibial. Mechanical orbital atherectomy down to the level of the ankle and then this was angioplastied with 2.3 mm and 3 mm balloons. Flow remained very sluggish down the posterior tibial artery but was patent. Post event there apparently was embolization of an area involving the plantar left heel and sides of the left heel. He arrives back in clinic today it has been almost 7 weeks since I have seen him. Miraculously the great toe area with open bone is closed over probably as result of Dr. Ainsley Spinner interventions. He does however have an extensive ischemic area on the plantar and medial aspect of his heel. I reviewed Dr.  Ainsley Spinner last note from 3 days ago on 6/22. He did not think there was enough inflow to heal the heel wound. With eschar and sloughing. Wondered whether he could heal a below-knee amputation but more likely an above the knee amputation when required. He was placed on Xeroform to the right heel In discussing things with the patient's family and the patient present he is not in a lot of pain. The area does not appear to be grossly infected. The patient appears to be comfortable and conversational. He is eating and drinking fairly well. 7/9; the patient saw Dr. Carlis Abbott on 6/29. He had previously done heroic attempt to establish blood flow as described above. 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 Although the eschar on the heel looks ominous both plantar and medial it is certainly no worse than last week. We will continue Betadine to this area with alginate. Ultimately this will separate off and we will see about the viability of the tissue underneath. Also in clinic today our nurses were washing his right great toe tip apparently some of  the skin came off eschared and there is an open wound at the tip of the great toe with exposed bone similar to what he had previously 7/23; he has the open area on the tip of his first toe which I think is mostly bone. Then he has the embolic area on the plantar heel extending into the medial heel. We have been using Betadine and silver alginate to the heel silver alginate to the toe. His daughter brings up that she was given nitroglycerin patches by Dr. Donzetta Matters to try and open blood flow to the foot. She has not been using them out of concern of systemic hypotension and also that the labels said not to put them on extremities 8/10-Returns at 3 weeks, open area on the tip of his first toe that is dried out, the heel with eschar, applying Betadine to the eschar area  and silver alginate dressing. 9/7; 1 month follow-up. He has an open area on the tip of the toe almost subungual with a thick mycotic nail. The heel wound has black eschar but this is beginning to separate. We have been using Betadine and silver alginate on the heel and simply silver alginate on the right great toe 9/23; the patient was worked in urgently at the request of his wife was concerned about infection in the tip of the toe. Apparently this was brought up by home health. 10/14; the patient has the area of exposed bone on the tip of the right great toe. He also has the eschared area on the plantar heel which was a embolic phenomenon at the time of attempted revascularization. He also had an area of denuded epithelium over both buttocks which I think is probably a combination of pressure friction and moisture. They have been using zinc oxide which is appropriate 10/26. The patient has an area of wider exposed bone at the tip of the right great toe. The substantial eschared area on the heel was already 50% separated I remove this. Actually the patient's wife had called repetitively for urgent appointments with some degree of erythema in the foot as the primary concern. It turns out that she has been to see her primary doctor after the patient had a fall off the commode resulting in skin tears to both forearms. He was placed on doxycycline because of concern of cellulitis of the toe. She is also been to see [facility] in [place] with regards to the toe. Apparently he shaved the bone and recommended Betadine to this. Noteworthy that the patient already had an extensive arterial evaluation with an angiogram. He has known PAD with tibial vessel disease. The last effort was an attempt to open up the posterior tibial artery which resulted in symptoms of embolization to the heel. Electronic Signature(s) Signed: 02/13/2020 4:47:47 PM By: Linton Ham MD Entered By: Linton Ham  on 02/13/2020 16:38:59 -------------------------------------------------------------------------------- Physical Exam Details Patient Name: Date of Service: [name] 02/13/2020 3:15 PM Medical Record Number: [id number] Patient Account Number: [id number] Date of Birth/Sex: Treating RN: Nov 14, 1923 (86 y.o. [name] Primary Care Provider: Sherrie Mustache Other Clinician: Referring Provider: Treating Provider/Extender: Deloria Lair in Treatment: 29 Constitutional Sitting or standing Blood Pressure is within target range for patient.. Pulse regular and within target range for patient.Marland Kitchen Respirations regular, non-labored and within target range.. Temperature is normal and within the target range for the patient.Marland Kitchen Appears in no distress. Cardiovascular Very faint pulses in the left foot at the dorsalis pedis  and posterior tibial. Notes Wound exam; right great toe on the tip has a wider swath of exposed bone. I do not believe that there is enough tissue here to attempt to do anything with this area. There was some mild erythema but not particularly impressive and he is already on antibiotics. The right heel eschar was about 50% separated I elected to go ahead and remove this this was done fairly easily. Tissue underneath does not look unhealthy. He has superficial skin tears on the forearms of both arms. Electronic Signature(s) Signed: 02/13/2020 4:47:47 PM By: Linton Ham MD Entered By: Linton Ham on 02/13/2020 16:40:25 -------------------------------------------------------------------------------- Physician Orders Details Patient Name: Date of Service: [name] 02/13/2020 3:15 PM Medical Record Number: [id number] Patient Account Number: [id number] Date of Birth/Sex: Treating RN: 1923/09/04 (86 y.o. [name] Primary Care Provider: Sherrie Mustache Other Clinician: Referring Provider: Treating Provider/Extender: Deloria Lair in Treatment: 29 Verbal / Phone Orders: No Diagnosis Coding ICD-10 Coding Code Description E11.621 Type 2 diabetes mellitus with foot ulcer L97.514 Non-pressure chronic ulcer of other part of right foot with necrosis of bone E11.40 Type 2 diabetes mellitus with diabetic neuropathy, unspecified Follow-up Appointments Return Appointment in 2 weeks. Dressing Change Frequency Change Dressing every other day. - home health to change 3x/week. Wound Cleansing May shower and wash wound with soap and water. - on days that dressing is changed Primary Wound Dressing Wound #2 Right T Great oe Silver Collagen - moisten with hydrogel or [place] Jelly. Wound #3 Right Calcaneus Iodoflex - or iodosorb ointment Wound #4 Right Forearm Xeroform Wound #5 Left Forearm Xeroform Secondary Dressing Wound #3 Right Calcaneus Kerlix/Rolled Gauze Dry Gauze Heel Cup Wound #2 Right T Great oe Kerlix/Rolled Gauze Dry Gauze Wound #4 Right Forearm Dry Gauze - and kerlix or bordered foam. Wound #5 Left Forearm Dry Gauze - and kerlix or bordered foam. Off-Loading Turn and reposition every 2 hours - no scooting on buttock. for any redness, or excoriation apply zinc oxide barrier cream to buttock to aid the buttock areas. [facility] for wound care. - [facility] Electronic Signature(s) Signed: 02/13/2020 4:47:47 PM By: Linton Ham MD Signed: 02/15/2020 5:24:43 PM By: Deon Pilling Entered By: Deon Pilling on 02/13/2020 16:24:55 -------------------------------------------------------------------------------- Problem List Details Patient Name: Date of Service: [name] 02/13/2020 3:15 PM Medical Record Number: [id number] Patient Account Number: [id number] Date of Birth/Sex: Treating RN: 1923/05/15 (86 y.o. [name] Primary Care Provider: Sherrie Mustache Other Clinician: Referring Provider: Treating  Provider/Extender: Deloria Lair in Treatment: 29 Active Problems ICD-10 Encounter Code Description Active Date MDM Diagnosis E11.621 Type 2 diabetes mellitus with foot ulcer 07/25/2019 No Yes L97.514 Non-pressure chronic ulcer of other part of right foot with necrosis of bone 07/25/2019 No Yes E11.40 Type 2 diabetes mellitus with diabetic neuropathy, unspecified 07/25/2019 No Yes L97.412 Non-pressure chronic ulcer of right heel and midfoot with fat layer exposed 02/13/2020 No Yes S40.812D Abrasion of left upper arm, subsequent encounter 02/13/2020 No Yes S40.811D Abrasion of right upper arm, subsequent encounter 02/13/2020 No Yes Inactive Problems Resolved Problems Electronic Signature(s) Signed: 02/13/2020 4:47:47 PM By: Linton Ham MD Entered By: Linton Ham on 02/13/2020 16:34:35 -------------------------------------------------------------------------------- Progress Note Details Patient Name: Date of Service: [place], [place]. 02/13/2020 3:15 PM Medical Record Number: [id number] Patient Account Number: [id number] Date of Birth/Sex: Treating RN: October 12, 1923 (86 y.o. [name] Primary Care Provider: Sherrie Mustache Other Clinician:  Referring Provider: Treating Provider/Extender: Deloria Lair in Treatment: 29 Subjective History of Present Illness (HPI) ADMISSION 05/02/2019 This is a 87 year old man accompanied by his daughter. He lives in [place] with his elderly wife. He has home caregivers as well. Apparently on February 28, 2019 they noted blood on his sock and noticed a wound on the tip of his right great toe. He has been cared for by Dr. Posey Pronto who is his podiatrist in [place]. Apparently they have been using Betadine to this area. The patient has a thick mycotic nail on the right as well which is embedded in the wound in some areas. Fortunately the patient has diabetic neuropathy and is not  really experiencing any pain otherwise I think this would all be quite painful. The patient was previously treated in 2019 for wounds on the left foot. This included a fairly thorough vascular review by vein and vascular Dr. Oneida Alar. His ABIs at that time were noncompressible bilaterally TBI on the right was 0.88 with biphasic waveforms on the right and monophasic waveforms at the left. He ended up having amputations of the left first and second toes. He did have an angiogram on 06/25/2017 which showed on the right the right common femoral profundofemoral for Morris and superficial femoral arteries were widely patent the right popliteal was widely patent. Tibial vessels were not well opacified secondary to motion artifact however he was felt to have all 3 tibial vessels intact with some calcification and some areas of stenosis within the posterior tibial artery but relatively good flow to the right lower leg. He also had the left reviewed as well which was the source of the problem at that time. Past medical history; prostate cancer on oral antiandrogen therapy with bone mets, GI stromal tumor in 2005, type 2 diabetes with peripheral neuropathy, hypertension, atrial fibrillation on Eliquis, history of non-Hodgkin's lymphoma treated with CHOP in 2005. He had amputations of the left first and second toes secondary to osteomyelitis. Does not appear that he has had an x-ray of the right foot ABI in our clinic on the right was noncompressible [see full arterial discussion above]. 1/19; area on the tip of the right great toe under a very mycotic nail tip. We use silver alginate. 1/26; area of the tip of the right great toe under a very mycotic nail. A lot of this seems to have closed down we have been using silver alginate 2/9; the areas on the tip of the right great toe under a very mycotic nail. A lot of this continues to close down. I do not think there is a subungual problem. I am hopeful that this mycotic  nail which is thick and raised will allow full epithelialization of the wound 2/23; this was a difficult area on the tip of his right great toe. Thick mycotic nail over the base of the wound. This appears to have closed down now. The nail itself is thick split with considerable subungual debris. READMISSION 07/25/2019 This is a 86 year old man that we cared for earlier this year in the clinic without a wound on the tip of his right great toe under a very mycotic toenail. He is a type II diabetic. His ABIs have been noncompressible however he has been felt to have adequate blood flow in the right to heal the wound. We were able to get this wound to close over as I understand things he followed with Dr. Caprice Beaver had some debridement of the nail everything looks  fine and apparently the wound bed was washed off by his wife over the weekend and by Monday it was open. His daughter is able to show me pictures. Unfortunately this now has exposed bone raising the possibility that he has had underlying osteomyelitis all along. He has been using Silvadene cream on this he has been referred back to the clinic by Dr. Caprice Beaver. He had x-rays of the right foot that did not show evidence of osteomyelitis in the right foot. He has not been on antibiotics He also had an x-ray of the left foot that showed findings suspicious for osteomyelitis involving the residual base of the first proximal phalanx and the proximal phalanx of the left second toe as well as the left second metatarsal head however he has no wounds in this area. 4/12; culture from last time of bone in the right great toe was negative. The patient's wife apparently scrubbed this toe vigorously and according to his daughter reopen the wound however unfortunately it is all exposed bone last time. I removed some of this and we use Prisma unfortunately the area does not look much better today. Previous x-rays done in podiatry office Dr. Caprice Beaver did not show  evidence of osteomyelitis. Nevertheless I think osteomyelitis here is probably quite likely. Still using Prisma 4/26; he took 2 weeks of doxycycline although his daughter is telling me he is not eating. He has loose bowel movements however I did not get the sense that this is changed all that much. He still has exposed bone. Culture I did last time showed a few coag negative staph I do not think that this is necessarily relevant. I still am operating under the premise that the patient probably has osteomyelitis. After considerable difficulty we are able to determine he is not allergic to cephalosporins I therefore gave him Keflex. 5/10; is tolerating Keflex 500 3 times daily quite well. He still has the small wound in the tip of his toe with exposed bone.Marland Kitchen Unfortunately he does not really have any granulation tissue. He saw Dr. Caprice Beaver of podiatry in [place] last week I have not had a chance to look at his notes The patient had an extensive vascular work-up including an angiogram in 2019. He was felt to have adequate blood supply on the right at the time although he did have amputations of the left first and second toe by Dr. Oneida Alar. 09/11/19 -Patient is back and is about to finish his Keflex, appointment with Dr. Oneida Alar is in the works. The right great toe wound looks about the same, the left third toe dorsal wound has healed. There was a small eschar covering it that was removed by podiatry.According to patient's daughter the right great toe base on the plantar surface had some redness that seems to have disappeared. 10/13/2019; patient is back in clinic today after 1 month hiatus although I have not seen him since 5/10. Since he was last here he was admitted to hospital from 6/2 through 6/5 because of a nonhealing wound on his right great toe. He underwent an angiogram which showed patent common femoral, profunda, SFA above and below-knee popliteal artery. The dominant runoff in the right lower  extremity was through the anterior tibial but that became very small at the level of the ankle and had minimal outflow into the foot. The peroneal artery was patent but atretic. The posterior tibial flow was much slower in the artery appeared to have multiple focal high-grade stenoses as well as short chronic total  occlusion in the distal segment. Interventions were attempted on the posterior tibial. Mechanical orbital atherectomy down to the level of the ankle and then this was angioplastied with 2.3 mm and 3 mm balloons. Flow remained very sluggish down the posterior tibial artery but was patent. Post event there apparently was embolization of an area involving the plantar left heel and sides of the left heel. He arrives back in clinic today it has been almost 7 weeks since I have seen him. Miraculously the great toe area with open bone is closed over probably as result of Dr. Ainsley Spinner interventions. He does however have an extensive ischemic area on the plantar and medial aspect of his heel. I reviewed Dr. Ainsley Spinner last note from 3 days ago on 6/22. He did not think there was enough inflow to heal the heel wound. With eschar and sloughing. Wondered whether he could heal a below-knee amputation but more likely an above the knee amputation when required. He was placed on Xeroform to the right heel In discussing things with the patient's family and the patient present he is not in a lot of pain. The area does not appear to be grossly infected. The patient appears to be comfortable and conversational. He is eating and drinking fairly well. 7/9; the patient saw Dr. Carlis Abbott on 6/29. He had previously done heroic attempt to establish blood flow as described above. 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 Although the eschar on the heel looks ominous both plantar and medial it is certainly no worse than  last week. We will continue Betadine to this area with alginate. Ultimately this will separate off and we will see about the viability of the tissue underneath. Also in clinic today our nurses were washing his right great toe tip apparently some of the skin came off eschared and there is an open wound at the tip of the great toe with exposed bone similar to what he had previously 7/23; he has the open area on the tip of his first toe which I think is mostly bone. Then he has the embolic area on the plantar heel extending into the medial heel. We have been using Betadine and silver alginate to the heel silver alginate to the toe. His daughter brings up that she was given nitroglycerin patches by Dr. Donzetta Matters to try and open blood flow to the foot. She has not been using them out of concern of systemic hypotension and also that the labels said not to put them on extremities 8/10-Returns at 3 weeks, open area on the tip of his first toe that is dried out, the heel with eschar, applying Betadine to the eschar area and silver alginate dressing. 9/7; 1 month follow-up. He has an open area on the tip of the toe almost subungual with a thick mycotic nail. The heel wound has black eschar but this is beginning to separate. We have been using Betadine and silver alginate on the heel and simply silver alginate on the right great toe 9/23; the patient was worked in urgently at the request of his wife was concerned about infection in the tip of the toe. Apparently this was brought up by home health. 10/14; the patient has the area of exposed bone on the tip of the right great toe. He also has the eschared area on the plantar heel which was a embolic phenomenon at the time of attempted revascularization. He also  had an area of denuded epithelium over both buttocks which I think is probably a combination of pressure friction and moisture. They have been using zinc oxide which is appropriate 10/26. The patient has an  area of wider exposed bone at the tip of the right great toe. The substantial eschared area on the heel was already 50% separated I remove this. Actually the patient's wife had called repetitively for urgent appointments with some degree of erythema in the foot as the primary concern. It turns out that she has been to see her primary doctor after the patient had a fall off the commode resulting in skin tears to both forearms. He was placed on doxycycline because of concern of cellulitis of the toe. She is also been to see [facility] in [place] with regards to the toe. Apparently he shaved the bone and recommended Betadine to this. Noteworthy that the patient already had an extensive arterial evaluation with an angiogram. He has known PAD with tibial vessel disease. The last effort was an attempt to open up the posterior tibial artery which resulted in symptoms of embolization to the heel. Objective Constitutional Sitting or standing Blood Pressure is within target range for patient.. Pulse regular and within target range for patient.Marland Kitchen Respirations regular, non-labored and within target range.. Temperature is normal and within the target range for the patient.Marland Kitchen Appears in no distress. Vitals Time Taken: 3:30 PM, Height: 70 in, Weight: 140 lbs, BMI: 20.1, Temperature: 99 F, Pulse: 86 bpm, Respiratory Rate: 18 breaths/min, Blood Pressure: 141/72 mmHg, Capillary Blood Glucose: 137 mg/dl. Cardiovascular Very faint pulses in the left foot at the dorsalis pedis and posterior tibial. General Notes: Wound exam; right great toe on the tip has a wider swath of exposed bone. I do not believe that there is enough tissue here to attempt to do anything with this area. There was some mild erythema but not particularly impressive and he is already on antibiotics. ooThe right heel eschar was about 50% separated I elected to go ahead and remove this this was done fairly easily. Tissue underneath does  not look unhealthy. ooHe has superficial skin tears on the forearms of both arms. Integumentary (Hair, Skin) Wound #2 status is Open. Original cause of wound was Gradually Appeared. The wound is located on the Right T Great. The wound measures 0.8cm length x oe 1cm width x 0.2cm depth; 0.628cm^2 area and 0.126cm^3 volume. Wound #3 status is Open. Original cause of wound was Gradually Appeared. The wound is located on the Right Calcaneus. The wound measures 2.5cm length x 6.3cm width x 0.1cm depth; 12.37cm^2 area and 1.237cm^3 volume. Wound #4 status is Open. Original cause of wound was Trauma. The wound is located on the Right Forearm. The wound measures 3cm length x 1cm width x 0.1cm depth; 2.356cm^2 area and 0.236cm^3 volume. There is no tunneling or undermining noted. There is a medium amount of serosanguineous drainage noted. The wound margin is flat and intact. There is large (67-100%) red granulation within the wound bed. There is no necrotic tissue within the wound bed. Wound #5 status is Open. Original cause of wound was Trauma. The wound is located on the Left Forearm. The wound measures 7cm length x 3.4cm width x 0.1cm depth; 18.692cm^2 area and 1.869cm^3 volume. There is no tunneling or undermining noted. There is a medium amount of serosanguineous drainage noted. The wound margin is flat and intact. There is large (67-100%) red granulation within the wound bed. There is no necrotic tissue  within the wound bed. Assessment Active Problems ICD-10 Type 2 diabetes mellitus with foot ulcer Non-pressure chronic ulcer of other part of right foot with necrosis of bone Type 2 diabetes mellitus with diabetic neuropathy, unspecified Non-pressure chronic ulcer of right heel and midfoot with fat layer exposed Abrasion of left upper arm, subsequent encounter Abrasion of right upper arm, subsequent encounter Procedures Wound #3 Pre-procedure diagnosis of Wound #3 is a Pressure Ulcer located on  the Right Calcaneus .Severity of Tissue Pre Debridement is: Fat layer exposed. There was a Selective/Open Wound Non-Viable Tissue Debridement with a total area of 15.75 sq cm performed by Ricard Dillon., MD. With the following instrument(s): Blade, and Forceps to remove Non-Viable tissue/material. Material removed includes Eschar after achieving pain control using Lidocaine 4% Topical Solution. A time out was conducted at 16:12, prior to the start of the procedure. A Minimum amount of bleeding was controlled with Pressure. The procedure was tolerated well with a pain level of 0 throughout and a pain level of 0 following the procedure. Post Debridement Measurements: 2.5cm length x 6.3cm width x 0.1cm depth; 1.237cm^3 volume. Post debridement Stage noted as Unstageable/Unclassified. Character of Wound/Ulcer Post Debridement is improved. Severity of Tissue Post Debridement is: Fat layer exposed. Post procedure Diagnosis Wound #3: Same as Pre-Procedure Plan Follow-up Appointments: Return Appointment in 2 weeks. Dressing Change Frequency: Change Dressing every other day. - home health to change 3x/week. Wound Cleansing: May shower and wash wound with soap and water. - on days that dressing is changed Primary Wound Dressing: Wound #2 Right T Great: oe Silver Collagen - moisten with hydrogel or [place] Jelly. Wound #3 Right Calcaneus: Iodoflex - or iodosorb ointment Wound #4 Right Forearm: Xeroform Wound #5 Left Forearm: Xeroform Secondary Dressing: Wound #3 Right Calcaneus: Kerlix/Rolled Gauze Dry Gauze Heel Cup Wound #2 Right T Great: oe Kerlix/Rolled Gauze Dry Gauze Wound #4 Right Forearm: Dry Gauze - and kerlix or bordered foam. Wound #5 Left Forearm: Dry Gauze - and kerlix or bordered foam. Off-Loading: Turn and reposition every 2 hours - no scooting on buttock. for any redness, or excoriation apply zinc oxide barrier cream to buttock to aid the buttock areas. Home  Health: [facility] for wound care. - [facility]. The tip of the right great toe has exposed bone. I do not think there is any way to deal with this other than palliative dressings to keep this uninfected moist. Silver collagen fits that bill. I do not expect any healing here. 2. In another setting I would recommend an amputation however the patient has known PAD and I am not sure he could heal the surgical area nevertheless if this toe deteriorates that may be the option that we are going to be facing. He follows with Dr. Caprice Beaver in [place]. 3. The ischemic eschared area on the heel was removed this this is already mostly separated tissue under here does not look unhealthy. Will use Iodoflex 4. Superficial abrasions to his arm his primary doctor had already started him on Xeroform with gauze I think that is the appropriate choice of dressing Electronic Signature(s) Signed: 02/13/2020 4:47:47 PM By: Linton Ham MD Entered By: Linton Ham on 02/13/2020 16:42:36 -------------------------------------------------------------------------------- SuperBill Details Patient Name: Date of Service: [name] 02/13/2020 Medical Record Number: [id number] Patient Account Number: [id number] Date of Birth/Sex: Treating RN: 1923-10-06 (86 y.o. [name] Primary Care Provider: Sherrie Mustache Other Clinician: Referring Provider: Treating Provider/Extender: Deloria Lair in  Treatment: 29 Diagnosis Coding ICD-10 Codes Code Description E11.621 Type 2 diabetes mellitus with foot ulcer L97.514 Non-pressure chronic ulcer of other part of right foot with necrosis of bone E11.40 Type 2 diabetes mellitus with diabetic neuropathy, unspecified L97.412 Non-pressure chronic ulcer of right heel and midfoot with fat layer exposed S40.812D Abrasion of left upper arm, subsequent encounter S40.811D Abrasion of right upper arm, subsequent  encounter Facility Procedures CPT4 Code: [id number] Description: [place] VISIT-LEV 4 EST PT Modifier: Quantity: 1 CPT4 Code: [id number] Description: 7597 - DEBRIDE WOUND 1ST 20 SQ CM OR < ICD-10 Diagnosis Description L97.412 Non-pressure chronic ulcer of right heel and midfoot with fat layer exposed E11.621 Type 2 diabetes mellitus with foot ulcer Modifier: Quantity: 1 Physician Procedures : CPT4 Code Description Modifier [id number] - WC PHYS DEBR WO ANESTH 20 SQ CM ICD-10 Diagnosis Description R32.023 Non-pressure chronic ulcer of right heel and midfoot with fat layer exposed E11.621 Type 2 diabetes mellitus with foot ulcer Quantity: 1 Electronic Signature(s) Signed: 02/15/2020 5:01:15 PM By: Linton Ham MD Signed: 02/15/2020 5:24:43 PM By: Deon Pilling Previous Signature: 02/13/2020 4:47:47 PM Version By: Linton Ham MD Entered By: Deon Pilling on 02/13/2020 17:13:22

## 2020-02-17 DIAGNOSIS — E11621 Type 2 diabetes mellitus with foot ulcer: Secondary | ICD-10-CM | POA: Diagnosis not present

## 2020-02-17 DIAGNOSIS — E1142 Type 2 diabetes mellitus with diabetic polyneuropathy: Secondary | ICD-10-CM | POA: Diagnosis not present

## 2020-02-17 DIAGNOSIS — C61 Malignant neoplasm of prostate: Secondary | ICD-10-CM | POA: Diagnosis not present

## 2020-02-17 DIAGNOSIS — L97514 Non-pressure chronic ulcer of other part of right foot with necrosis of bone: Secondary | ICD-10-CM | POA: Diagnosis not present

## 2020-02-17 DIAGNOSIS — E1151 Type 2 diabetes mellitus with diabetic peripheral angiopathy without gangrene: Secondary | ICD-10-CM | POA: Diagnosis not present

## 2020-02-17 DIAGNOSIS — C8581 Other specified types of non-Hodgkin lymphoma, lymph nodes of head, face, and neck: Secondary | ICD-10-CM | POA: Diagnosis not present

## 2020-02-19 ENCOUNTER — Telehealth: Payer: Self-pay

## 2020-02-19 DIAGNOSIS — L97514 Non-pressure chronic ulcer of other part of right foot with necrosis of bone: Secondary | ICD-10-CM | POA: Diagnosis not present

## 2020-02-19 DIAGNOSIS — C8581 Other specified types of non-Hodgkin lymphoma, lymph nodes of head, face, and neck: Secondary | ICD-10-CM | POA: Diagnosis not present

## 2020-02-19 DIAGNOSIS — E1151 Type 2 diabetes mellitus with diabetic peripheral angiopathy without gangrene: Secondary | ICD-10-CM | POA: Diagnosis not present

## 2020-02-19 DIAGNOSIS — E1142 Type 2 diabetes mellitus with diabetic polyneuropathy: Secondary | ICD-10-CM | POA: Diagnosis not present

## 2020-02-19 DIAGNOSIS — E11621 Type 2 diabetes mellitus with foot ulcer: Secondary | ICD-10-CM | POA: Diagnosis not present

## 2020-02-19 DIAGNOSIS — C61 Malignant neoplasm of prostate: Secondary | ICD-10-CM | POA: Diagnosis not present

## 2020-02-20 ENCOUNTER — Ambulatory Visit (INDEPENDENT_AMBULATORY_CARE_PROVIDER_SITE_OTHER): Payer: Medicare Other | Admitting: Urology

## 2020-02-20 ENCOUNTER — Encounter: Payer: Self-pay | Admitting: Urology

## 2020-02-20 ENCOUNTER — Other Ambulatory Visit: Payer: Self-pay

## 2020-02-20 VITALS — BP 113/75 | HR 87 | Temp 97.8°F | Ht 70.0 in | Wt 139.0 lb

## 2020-02-20 DIAGNOSIS — N3000 Acute cystitis without hematuria: Secondary | ICD-10-CM | POA: Diagnosis not present

## 2020-02-20 LAB — BLADDER SCAN AMB NON-IMAGING: Scan Result: 116

## 2020-02-20 NOTE — Progress Notes (Signed)
H&P  Chief Complaint: Prostate Cancer  History of Present Illness:  11.2.2021: Pt reports that his FOS has diminished significantly during the last month and he now has difficulty initiating and maintaining his stream. He has experienced recent gross hematuria and is incontinent.  Recent urinalysis showed microscopic hematuria and recent culture was negative.  He has become significantly weaker over the past several weeks.  He is basically in a chair or laying down all day.  He does have foot ulcers.  He is still getting reduced dose Zytiga and prednisone.  PSA undetectable.  IPSS Questionnaire (AUA-7): Over the past month.   1)  How often have you had a sensation of not emptying your bladder completely after you finish urinating?  2 - Less than half the time  2)  How often have you had to urinate again less than two hours after you finished urinating? 2 - Less than half the time  3)  How often have you found you stopped and started again several times when you urinated?  5 - Almost always  4) How difficult have you found it to postpone urination?  3 - About half the time  5) How often have you had a weak urinary stream?  5 - Almost always  6) How often have you had to push or strain to begin urination?  2 - Less than half the time  7) How many times did you most typically get up to urinate from the time you went to bed until the time you got up in the morning?  2 - 2 times  Total score:  0-7 mildly symptomatic   8-19 moderately symptomatic   20-35 severely symptomatic  QoL score: 4   (below copied from [facility] records):  I have prostate cancer. HPI: [name] is a 86 year-old male established patient who is here evaluation for treatment of prostate cancer.  His prostate cancer was diagnosed 08/10/2013. His PSA at his time of diagnosis was 54. His most recent PSA is 0.   He has undergone Hormonal Therapy for treatment.   TURP on 4.23.2015. He had incidentally found adenocarcinoma  the prostate, GS 4+3 in most of the chips.   CT scan following this revealed no evidence of adenopathy. There were sclerotic foci in the axial and appendicular skeleton which were stable compared to 2012. Bone scan revealed possible osseous metastases in the right 10th rib, L3 vertebral body and the sternum. He is being followed here at the [facility]. He is on androgen deprivation therapy, his last 6 month Lupron injection was in February 2017. He is now on Xgeva as well for bony metastatic disease. [name] has been added to his medication regimen as well.   His PSA is responding well.   He has been getting his Lupron, Zytiga and Xgeva at the [facility]. His PSA, recently checked is undetectable  7.2.2019: Most recent PSA from 02/09/2017 is 0.01. He has not had a recent testosterone level. He denies gross hematuria or dysuria. He has had no blood in stool. Overall, he has a good stream. He has had steady weight loss. He returned home early from this annual [place] vacation due to ischemic problems with his left lower extremity. He has had amputation of his left great and second toe.   1.21.2020: Most recently, his PSA is still undetectable. He is still on Zytiga, although half dose. He is experiencing no bony pain.   11.3.2020: Here today for follow-up. PSA remains undetectable.  Recent bone scan shows improvement since starting on Zytiga. He has not had an infection since last visit. He does continue to have urinary leakage fairly consistently but is occasionally able to use a bedside urinal. He uses diapers to control leakage. He has been in PT for trying to rebuild strength but he spends most of the day in a power chair -- he has significant mobility issues.   CC/HPI: 08/19/2018: Presents today regarding frequent/recurrent UTi. He was hospitalized in March for possible sepsis and treated for pneumonia and associated enterococcus UTi. Notes from his oncologist via [place] on 08/05/2018 stated pt  had an episode of hematuria and was diagnosed with a UTi beginning [place]. This was from another provider and no records of urine c/s is available. He continues taking 2 tablets of Zytiga and prednisone 2.5 mg twice daily as well as receiving Xgeva injections. His PSA remains undetectable as of 08/05/2018.   Pt now currently taking Bactrim DS BID through today. His usually UTi symptoms include painful/burning voiding, occassional gross hematuria, and altered mental status. His daughter is here today with him and she is very involved in his care. Unfortunately she doesn't have record of most recent positive urine culture results but states this was sensitive to current antimicrobial therapy. Patient grossly asymptomatic today and has returned to baseline voiding symptoms. No new flank or back pain. Afebrile, tolerating oral intake without n/v. Mentating at his baseline, alert and oriented 3. When he takes antibiotics, she tells me his symptoms quickly resolved within a few days after beginning therapy. She does tell me that for the past year or so he's been diagnosed with recurrent UTIs every 4-6 weeks and his primary care provider recommended urological follow-up for discussion regarding suppressive therapy.    09/26/2018: Returns today for concerns about UTi recurrence. At last OV, I placed him on nightly TMP for one month in an effort to break the cycle of recurrent UTi. He was completing a course of Bactrim at that time which upon further review was sensitive to the organism noted on last positive culture from late April. Previous 2 organisms that were positive on urine c/s were enterococcus and Escherichia coli, all occurring within the past 3-4 months.   Since completing TMP last week, pt has had worsening frequency/urgency as well as increased fatigue and decreased alertness per family member. Symptoms were not present until stopping TMP. He had a low grade fever last night and this morning which  improved with tylenol use. Denies painful voiding or gross hematuria. No new flank or lower back pain.     Past Medical History:  Diagnosis Date  . Acute bronchitis   . Acute lower UTI 11/25/2017  . Amputation of toe of left foot ([place]) 07/30/2017, 08/27/2017    Great toe 07/30/2017 Second toe 08/27/2017   . Arthritis   . Aspiration pneumonia ([place]) 01/31/2018  . Atrial fibrillation ([place])    Dr. Harl Bowie- [facility] follows saw 11'14  . Bladder stones    tx. with oral meds and antibiotics, now surgery planned  . Bone metastases ([place]) 08/21/2015  . Cataracts, both eyes    surgery planned May 2015  . Community acquired pneumonia 04/16/2018  . Cystoid macular edema 04/21/2015    Right eye 04/21/2015   . Dehydration 04/16/2018  . Dementia without behavioral disturbance ([place]) 04/16/2018  . Diabetes mellitus without complication ([place])    Type II  . Diarrhea 11/25/2017  . Dyspnea    with activity  . Dysrhythmia  Afib  . Family history of breast cancer   . Family history of colon cancer   . Genetic testing 02/09/2017  . GERD (gastroesophageal reflux disease)   . 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 Margot Chimes, thus far without recurrence.   Marland Kitchen History of MRI of lumbar spine 03/05/2014  . Hypertension   . Hypertrophy of prostate with urinary obstruction   . Hypomagnesemia 03/16/2017  . Metabolic encephalopathy 19/14/7829  . Metastatic adenocarcinoma to prostate ([place]) 09/08/2013  . 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 NHL (non-Hodgkin's lymphoma) ([place]) 2005   Diffuse large B-cell lymphoma, clinically stage IIIA, CD20 positive, status post cervical lymph node biopsy 08/03/2003 on the left. PET scan was also positive in the spleen and  small bowel region, but bone marrow aspiration and biopsy were negative. So he essentially had stage IIIAs. He received R-CHOP x6 cycles with CR established by PET scan criteria on 11/23/2003 with no evidence for relapse th  . Numbness 02/19/2017   Per [facility] new patient packet  . Osteomyelitis ([place]) 06/29/2017   toe of left foot   . PAD (peripheral artery disease) ([place]) 08/27/2017  . Polyneuropathy 02/19/2017  . Postural dizziness with presyncope 02/19/2017  . S/P TURP 08/10/2013  . Sepsis ([place]) 07/02/2018  . Skin cancer    Basal cell- face,head, neck,  arms, legs, Back  . Urinary frequency 02/19/2017    Past Surgical History:  Procedure Laterality Date  . ABDOMINAL AORTOGRAM W/LOWER EXTREMITY N/A 09/20/2019   Procedure: ABDOMINAL AORTOGRAM W/LOWER EXTREMITY;  Surgeon: Marty Heck, MD;  Location: [facility];  Service: Cardiovascular;  Laterality: N/A;  . AMPUTATION Left 06/29/2017   Procedure: AMPUTATION LEFT GREAT TOE;  Surgeon: [name], MD;  Location: [facility];  Service: Vascular;  Laterality: Left;  . AMPUTATION Left 08/27/2017   Procedure: AMPUTATION Left SECOND TOE;  Surgeon: [name], MD;  Location: [facility];  Service: Vascular;  Laterality: Left;  . CATARACT EXTRACTION Bilateral 2015  . CHOLECYSTECTOMY    . COLON RESECTION     small bowel  . CYSTOSCOPY WITH LITHOLAPAXY N/A 08/10/2013   Procedure: CYSTOSCOPY WITH LITHOLAPAXY WITH [name];  Surgeon: [name], MD;  Location: [facility];  Service: Urology;  Laterality: N/A;  . ESOPHAGEAL DILATION N/A 02/27/2016   Procedure: ESOPHAGEAL DILATION;  Surgeon: [name], MD;  [facility];  Service: Endoscopy;  Laterality: N/A;  . ESOPHAGEAL DILATION N/A 07/07/2018   Procedure: ESOPHAGEAL DILATION;  Surgeon: [name], MD;  [facility];  Service: Endoscopy;  Laterality: N/A;  . ESOPHAGOGASTRODUODENOSCOPY N/A 02/27/2016   Procedure: ESOPHAGOGASTRODUODENOSCOPY (EGD);   Surgeon: [name], MD;  [facility];  Service: Endoscopy;  Laterality: N/A;  12:00  . ESOPHAGOGASTRODUODENOSCOPY N/A 07/07/2018   Procedure: ESOPHAGOGASTRODUODENOSCOPY (EGD);  Surgeon: [name], MD;  [facility];  Service: Endoscopy;  Laterality: N/A;  . INCISION AND DRAINAGE PERIRECTAL ABSCESS    . LOWER EXTREMITY ANGIOGRAPHY N/A 06/25/2017   Procedure: LOWER EXTREMITY ANGIOGRAPHY;  Surgeon: [name], MD;  Location: [facility];  Service: Cardiovascular;  Laterality: N/A;  . LYMPH NODE DISSECTION Left    '05-neck  . PERIPHERAL VASCULAR ATHERECTOMY Right 09/20/2019   Procedure: PERIPHERAL VASCULAR  ATHERECTOMY;  Surgeon: Marty Heck, MD;  Location: [facility];  Service: Cardiovascular;  Laterality: Right;  Posterior tibial  . PORT-A-CATH REMOVAL    . PORTACATH PLACEMENT     insertion and removal -last chemotherapy 10 yrs ago  . TRANSURETHRAL RESECTION OF PROSTATE N/A 08/10/2013   Procedure: TRANSURETHRAL RESECTION OF THE PROSTATE WITH GYRUS INSTRUMENTS;  Surgeon: [name], MD;  [facility];  Service: Urology;  Laterality: N/A;    Home Medications:  Allergies as of 02/20/2020      Reactions   Tape Other (See Comments)   SKIN IS VERY THIN AND WILL TEAR AND BRUISE VERY EASILY!!!!   Augmentin [amoxicillin-pot Clavulanate] Rash, Other (See Comments)   Redness of skin      Medication List       Accurate as of February 20, 2020 12:57 PM. If you have any questions, ask your nurse or doctor.        abiraterone acetate 250 MG tablet Commonly known as: Zytiga Take 1 tablet (250 mg total) by mouth daily before breakfast. Take on an empty stomach 1 hour before or 2 hours after a meal   acetaminophen 500 MG tablet Commonly known as: TYLENOL Take 1 tablet (500 mg total) by mouth 3 (three) times daily.   Aspirin 81 MG Caps 81 mg daily.   blood glucose meter kit and supplies Dispense based on patient and  insurance preference. Use four times daily as directed. (FOR ICD-10 E10.9, E11.9).   clotrimazole-betamethasone cream Commonly known as: LOTRISONE Apply 1 application topically 2 (two) times daily as needed (skin irriation.).   Cranberry 500 MG Tabs Take 500 mg by mouth daily.   denosumab 120 MG/1.7ML Soln injection Commonly known as: XGEVA Inject 120 mg into the skin every 30 (thirty) days.   diclofenac Sodium 1 % Gel Commonly known as: VOLTAREN Apply 2 g topically 4 (four) times daily.   diltiazem 90 MG 12 hr capsule Commonly known as: CARDIZEM SR Take 1 capsule (90 mg total) by mouth 2 (two) times daily.   donepezil 10 MG tablet Commonly known as: ARICEPT Take 1 tablet (10 mg total) by mouth daily.   EasyMax Test test strip Generic drug: glucose blood Use as instructed   Eliquis 5 MG Tabs tablet Generic drug: apixaban Take 5 mg by mouth 2 (two) times daily.   feeding supplement Liqd Take 237 mLs by mouth every other day. CHOCOLATE   FIBER-CAPS PO Take 1 capsule by mouth in the morning and at bedtime.   fluticasone 50 MCG/ACT nasal spray Commonly known as: FLONASE Place 2 sprays into both nostrils daily as needed (allergies.).   furosemide 20 MG tablet Commonly known as: LASIX Take 20 mg by mouth daily as needed (retention/swelling.).   glipiZIDE 5 MG tablet Commonly known as: GLUCOTROL Take 1 tablet (5 mg total) by mouth daily before breakfast.   Lancets 28G Misc 1 kit by Does not apply route every morning.   lisinopril 20 MG tablet Commonly known as: ZESTRIL Take 1 tablet (20 mg total) by mouth in the morning and at bedtime.   Lupron Depot (30-Month) 45 MG injection Generic drug: Leuprolide Acetate (6 Month) Inject 45 mg into the muscle every 6 (six) months.   metFORMIN 850 MG tablet Commonly known as: GLUCOPHAGE Take 850 mg by mouth 2 (two) times daily with a meal.   mirtazapine 15 MG tablet Commonly known as: Remeron Take 1 tablet (15 mg total)  by mouth at bedtime.  multivitamin with minerals Tabs tablet Take 1 tablet by mouth daily.   nitroGLYCERIN 0.2 mg/hr patch Commonly known as: NITRODUR - Dosed in mg/24 hr Place 1 patch (0.2 mg total) onto the skin daily.   PROBIOTIC DAILY PO Take 1 tablet by mouth daily. Every morning   psyllium 58.6 % packet Commonly known as: METAMUCIL Take 1 packet by mouth daily.   traMADol 50 MG tablet Commonly known as: ULTRAM Take 1 tablet (50 mg total) by mouth every 6 (six) hours as needed for moderate pain.   Vitamin D3 25 MCG (1000 UT) Caps Take 1,000 Units by mouth daily.       Allergies:  Allergies  Allergen Reactions  . Tape Other (See Comments)    SKIN IS VERY THIN AND WILL TEAR AND BRUISE VERY EASILY!!!!  . Augmentin [Amoxicillin-Pot Clavulanate] Rash and Other (See Comments)    Redness of skin    Family History  Problem Relation Age of Onset  . Heart attack Mother        died in her 78s  . Other Father 110       pneumonia - died  . Breast cancer Sister        dx in her 62s-60s  . Colon cancer Brother        dx in his 37s  . Cancer Sister        TAH/BSO due to "male cancer"  . Breast cancer Daughter 1  . Breast cancer Daughter 37       DCIS - ATM VUS on [employer] 83 gene panel in 2018  . Breast cancer Daughter 21       DCIS - Negative on [employer] MyRisk 25 gene apnel in 2015  . Thyroid cancer Daughter 85       Medullary and Papillary  . Thyroid nodules Grandchild     Social History:  reports that he has never smoked. He has never used smokeless tobacco. He reports that he does not drink alcohol and does not use drugs.  ROS: A complete review of systems was performed.  All systems are negative except for pertinent findings as noted.  Physical Exam:  Vital signs in last 24 hours: There were no vitals taken for this visit. Constitutional:  Alert and oriented, No acute distress.  He seems cachectic, debilitated. Cardiovascular: Regular rate  Respiratory:  Normal respiratory effort Neurologic: Grossly intact, no focal deficits Psychiatric: Normal mood and affect  I have reviewed prior pt notes  I have reviewed notes from referring/previous physicians  I have reviewed urinalysis results  I have independently reviewed prior imaging/residual urine volume measurement today  I have reviewed prior PSA results  I have reviewed prior urine culture  Impression/Assessment:  1. PVR shows that pt is retaining approximately 122m. His symptoms are likely secondary to age-related systemic muscular atrophy and considering his PVR he does not require pharmaceutical intervention or catheterization at this time.  Plan:  1. Pt reassured regarding his diminished FOS.  I would rather not put him on pharmacologic management/alpha blockers  2. F/U in 4 weeks for OV and symptom recheck.  CC: JSherrie Mustache NP

## 2020-02-20 NOTE — Progress Notes (Signed)
Bladder Scan Patient cannot void: 116 ml Performed By: Durenda Guthrie, LPN     Urological Symptom Review  Patient is experiencing the following symptoms: Leakage of urine Stream starts and stops Trouble starting stream Weak stream   Review of Systems  Gastrointestinal (upper)  : Negative for upper GI symptoms  Gastrointestinal (lower) : Diarrhea  Constitutional : Fatigue  Skin: Negative for skin symptoms  Eyes: Negative for eye symptoms  Ear/Nose/Throat : Negative for Ear/Nose/Throat symptoms  Hematologic/Lymphatic: Easy bruising  Cardiovascular : Negative for cardiovascular symptoms  Respiratory : Negative for respiratory symptoms  Endocrine: Negative for endocrine symptoms  Musculoskeletal: Joint pain  Neurological: Negative for neurological symptoms  Psychologic: Negative for psychiatric symptoms

## 2020-02-21 ENCOUNTER — Telehealth: Payer: Self-pay

## 2020-02-21 DIAGNOSIS — E1151 Type 2 diabetes mellitus with diabetic peripheral angiopathy without gangrene: Secondary | ICD-10-CM | POA: Diagnosis not present

## 2020-02-21 DIAGNOSIS — C61 Malignant neoplasm of prostate: Secondary | ICD-10-CM | POA: Diagnosis not present

## 2020-02-21 DIAGNOSIS — E1142 Type 2 diabetes mellitus with diabetic polyneuropathy: Secondary | ICD-10-CM | POA: Diagnosis not present

## 2020-02-21 DIAGNOSIS — L97514 Non-pressure chronic ulcer of other part of right foot with necrosis of bone: Secondary | ICD-10-CM | POA: Diagnosis not present

## 2020-02-21 DIAGNOSIS — E11621 Type 2 diabetes mellitus with foot ulcer: Secondary | ICD-10-CM | POA: Diagnosis not present

## 2020-02-21 DIAGNOSIS — C8581 Other specified types of non-Hodgkin lymphoma, lymph nodes of head, face, and neck: Secondary | ICD-10-CM | POA: Diagnosis not present

## 2020-02-21 NOTE — Telephone Encounter (Signed)
Patient's daughter sent a my chart message with pictures about sudden and new petechia on patient's lower legs. Podiatrist recently prescribed Doxycycline and the appearance of petechia was shortly after. Explained that it was likely a reaction to the antibiotic rather than a worsening vascular issue. She denies he is having any airway/breathing problems. Instructed her to call PCP and podiatrist and inform them of the problem and ask recommendation. Told her to call us back if blue toes, severe pain or worsening ulcers. Daughter verbalized understanding.

## 2020-02-21 NOTE — Telephone Encounter (Signed)
Pt was seen in office 02/20/20.

## 2020-02-22 ENCOUNTER — Emergency Department (HOSPITAL_COMMUNITY)
Admission: EM | Admit: 2020-02-22 | Discharge: 2020-02-22 | Disposition: A | Discharge status: Home / Self Care | Payer: Medicare Other | Attending: Emergency Medicine | Admitting: Emergency Medicine

## 2020-02-22 ENCOUNTER — Emergency Department (HOSPITAL_COMMUNITY): Payer: Medicare Other

## 2020-02-22 DIAGNOSIS — I10 Essential (primary) hypertension: Secondary | ICD-10-CM | POA: Diagnosis not present

## 2020-02-22 DIAGNOSIS — E1151 Type 2 diabetes mellitus with diabetic peripheral angiopathy without gangrene: Secondary | ICD-10-CM | POA: Diagnosis not present

## 2020-02-22 DIAGNOSIS — S91301A Unspecified open wound, right foot, initial encounter: Secondary | ICD-10-CM | POA: Diagnosis not present

## 2020-02-22 DIAGNOSIS — Z7984 Long term (current) use of oral hypoglycemic drugs: Secondary | ICD-10-CM | POA: Insufficient documentation

## 2020-02-22 DIAGNOSIS — Z7982 Long term (current) use of aspirin: Secondary | ICD-10-CM | POA: Insufficient documentation

## 2020-02-22 DIAGNOSIS — Z515 Encounter for palliative care: Secondary | ICD-10-CM | POA: Diagnosis not present

## 2020-02-22 DIAGNOSIS — L0390 Cellulitis, unspecified: Secondary | ICD-10-CM | POA: Diagnosis not present

## 2020-02-22 DIAGNOSIS — Z85828 Personal history of other malignant neoplasm of skin: Secondary | ICD-10-CM | POA: Diagnosis not present

## 2020-02-22 DIAGNOSIS — R531 Weakness: Secondary | ICD-10-CM | POA: Diagnosis not present

## 2020-02-22 DIAGNOSIS — C8581 Other specified types of non-Hodgkin lymphoma, lymph nodes of head, face, and neck: Secondary | ICD-10-CM | POA: Diagnosis not present

## 2020-02-22 DIAGNOSIS — M19071 Primary osteoarthritis, right ankle and foot: Secondary | ICD-10-CM | POA: Diagnosis not present

## 2020-02-22 DIAGNOSIS — Z7189 Other specified counseling: Secondary | ICD-10-CM

## 2020-02-22 DIAGNOSIS — F0390 Unspecified dementia without behavioral disturbance: Secondary | ICD-10-CM | POA: Insufficient documentation

## 2020-02-22 DIAGNOSIS — Z8546 Personal history of malignant neoplasm of prostate: Secondary | ICD-10-CM | POA: Diagnosis not present

## 2020-02-22 DIAGNOSIS — Z79899 Other long term (current) drug therapy: Secondary | ICD-10-CM | POA: Insufficient documentation

## 2020-02-22 DIAGNOSIS — C61 Malignant neoplasm of prostate: Secondary | ICD-10-CM | POA: Diagnosis not present

## 2020-02-22 DIAGNOSIS — E11621 Type 2 diabetes mellitus with foot ulcer: Secondary | ICD-10-CM | POA: Diagnosis not present

## 2020-02-22 DIAGNOSIS — Z789 Other specified health status: Secondary | ICD-10-CM

## 2020-02-22 DIAGNOSIS — L97514 Non-pressure chronic ulcer of other part of right foot with necrosis of bone: Secondary | ICD-10-CM | POA: Diagnosis not present

## 2020-02-22 DIAGNOSIS — L03115 Cellulitis of right lower limb: Secondary | ICD-10-CM | POA: Diagnosis not present

## 2020-02-22 DIAGNOSIS — R509 Fever, unspecified: Secondary | ICD-10-CM | POA: Diagnosis not present

## 2020-02-22 DIAGNOSIS — M79674 Pain in right toe(s): Secondary | ICD-10-CM | POA: Diagnosis present

## 2020-02-22 DIAGNOSIS — J9811 Atelectasis: Secondary | ICD-10-CM | POA: Diagnosis not present

## 2020-02-22 DIAGNOSIS — E1142 Type 2 diabetes mellitus with diabetic polyneuropathy: Secondary | ICD-10-CM | POA: Diagnosis not present

## 2020-02-22 DIAGNOSIS — Z66 Do not resuscitate: Secondary | ICD-10-CM | POA: Diagnosis not present

## 2020-02-22 DIAGNOSIS — Z7901 Long term (current) use of anticoagulants: Secondary | ICD-10-CM | POA: Insufficient documentation

## 2020-02-22 DIAGNOSIS — Z20822 Contact with and (suspected) exposure to covid-19: Secondary | ICD-10-CM | POA: Insufficient documentation

## 2020-02-22 LAB — COMPREHENSIVE METABOLIC PANEL
ALT: 12 U/L (ref 0–44)
AST: 14 U/L — ABNORMAL LOW (ref 15–41)
Albumin: 3.2 g/dL — ABNORMAL LOW (ref 3.5–5.0)
Alkaline Phosphatase: 69 U/L (ref 38–126)
Anion gap: 7 (ref 5–15)
BUN: 23 mg/dL (ref 8–23)
CO2: 25 mmol/L (ref 22–32)
Calcium: 10.4 mg/dL — ABNORMAL HIGH (ref 8.9–10.3)
Chloride: 103 mmol/L (ref 98–111)
Creatinine, Ser: 1.18 mg/dL (ref 0.61–1.24)
GFR, Estimated: 57 mL/min — ABNORMAL LOW (ref >60)
Glucose, Bld: 96 mg/dL (ref 70–99)
Potassium: 4.7 mmol/L (ref 3.5–5.1)
Sodium: 135 mmol/L (ref 135–145)
Total Bilirubin: 0.7 mg/dL (ref 0.3–1.2)
Total Protein: 6.3 g/dL — ABNORMAL LOW (ref 6.5–8.1)

## 2020-02-22 LAB — CBC WITH DIFFERENTIAL/PLATELET
Abs Immature Granulocytes: 0.09 10*3/uL — ABNORMAL HIGH (ref 0.00–0.07)
Basophils Absolute: 0 10*3/uL (ref 0.0–0.1)
Basophils Relative: 1 %
Eosinophils Absolute: 0.3 10*3/uL (ref 0.0–0.5)
Eosinophils Relative: 4 %
HCT: 29.2 % — ABNORMAL LOW (ref 39.0–52.0)
Hemoglobin: 9.5 g/dL — ABNORMAL LOW (ref 13.0–17.0)
Immature Granulocytes: 1 %
Lymphocytes Relative: 32 %
Lymphs Abs: 2.7 10*3/uL (ref 0.7–4.0)
MCH: 30.4 pg (ref 26.0–34.0)
MCHC: 32.5 g/dL (ref 30.0–36.0)
MCV: 93.6 fL (ref 80.0–100.0)
Monocytes Absolute: 1.5 10*3/uL — ABNORMAL HIGH (ref 0.1–1.0)
Monocytes Relative: 18 %
Neutro Abs: 3.7 10*3/uL (ref 1.7–7.7)
Neutrophils Relative %: 44 %
Platelets: 268 10*3/uL (ref 150–400)
RBC: 3.12 MIL/uL — ABNORMAL LOW (ref 4.22–5.81)
RDW: 14.1 % (ref 11.5–15.5)
WBC: 8.3 10*3/uL (ref 4.0–10.5)
nRBC: 0 % (ref 0.0–0.2)

## 2020-02-22 LAB — RESPIRATORY PANEL BY RT PCR (FLU A&B, COVID)
Influenza A by PCR: NEGATIVE
Influenza B by PCR: NEGATIVE
SARS Coronavirus 2 by RT PCR: NEGATIVE

## 2020-02-22 LAB — LACTIC ACID, PLASMA: Lactic Acid, Venous: 1.5 mmol/L (ref 0.5–1.9)

## 2020-02-22 MED ORDER — PIPERACILLIN-TAZOBACTAM 3.375 G IVPB 30 MIN
3.3750 g | Freq: Once | INTRAVENOUS | Status: DC
  Filled 2020-02-22: qty 50

## 2020-02-22 MED ORDER — CLINDAMYCIN HCL 150 MG PO CAPS: 150.0000 mg | ORAL_CAPSULE | Freq: Three times a day (TID) | ORAL | 0 refills | Status: AC

## 2020-02-22 MED ORDER — VANCOMYCIN HCL IN DEXTROSE 1-5 GM/200ML-% IV SOLN
1000.0000 mg | Freq: Once | INTRAVENOUS | Status: DC
  Filled 2020-02-22: qty 200

## 2020-02-22 NOTE — ED Provider Notes (Signed)
[facility] EMERGENCY DEPARTMENT Provider Note   CSN: [id number] Arrival date & time: 02/22/20  0940     History Chief Complaint  Patient presents with  . Wound Infection    [name] is a 86 y.o. male.  Patient here for reevaluation of right big toe pain and redness.  Has recently been on Bactrim and now doxycycline for possible toe infection.  Has peripheral vascular disease and follows with vascular surgery who has done toe amputations on the right side.  Patient follows with wound care as well.  Seems as if the toe redness is not getting much better after these rounds of antibiotics.  Possibly a fever at home.  The history is provided by the patient.  Foot Pain This is a new problem. The current episode started more than 1 week ago. The problem occurs daily. The problem has not changed since onset.Pertinent negatives include no chest pain, no abdominal pain, no headaches and no shortness of breath. Nothing aggravates the symptoms. Nothing relieves the symptoms. He has tried nothing for the symptoms. The treatment provided no relief.       Past Medical History:  Diagnosis Date  . Acute bronchitis   . Acute lower UTI 11/25/2017  . Amputation of toe of left foot ([place]) 07/30/2017, 08/27/2017    Great toe 07/30/2017 Second toe 08/27/2017   . Arthritis   . Aspiration pneumonia ([place]) 01/31/2018  . Atrial fibrillation ([place])    Dr. Harl Bowie- [facility] follows saw 11'14  . Bladder stones    tx. with oral meds and antibiotics, now surgery planned  . Bone metastases ([place]) 08/21/2015  . Cataracts, both eyes    surgery planned May 2015  . Community acquired pneumonia 04/16/2018  . Cystoid macular edema 04/21/2015    Right eye 04/21/2015   . Dehydration 04/16/2018  . Dementia without behavioral disturbance ([place]) 04/16/2018  . Diabetes mellitus without complication ([place])    Type II  . Diarrhea 11/25/2017  . Dyspnea    with activity  . Dysrhythmia    Afib  .  Family history of breast cancer   . Family history of colon cancer   . Genetic testing 02/09/2017  . GERD (gastroesophageal reflux disease)   . 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 Margot Chimes, thus far without recurrence.   Marland Kitchen History of MRI of lumbar spine 03/05/2014  . Hypertension   . Hypertrophy of prostate with urinary obstruction   . Hypomagnesemia 03/16/2017  . Metabolic encephalopathy 82/95/6213  . Metastatic adenocarcinoma to prostate ([place]) 09/08/2013  . 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 NHL (non-Hodgkin's lymphoma) ([place]) 2005   Diffuse large B-cell lymphoma, clinically stage IIIA, CD20 positive, status post cervical lymph node biopsy 08/03/2003 on the left. PET scan was also positive in the spleen and small bowel region, but bone marrow aspiration and biopsy were negative. So he essentially had stage IIIAs. He received R-CHOP x6 cycles with CR established by PET scan criteria on 11/23/2003 with no evidence for relapse th  . Numbness 02/19/2017   Per [place] new patient packet  . Osteomyelitis ([place]) 06/29/2017   toe of left foot   . PAD (peripheral artery disease) ([place]) 08/27/2017  . Polyneuropathy 02/19/2017  . Postural dizziness with presyncope 02/19/2017  .  S/P TURP 08/10/2013  . Sepsis ([place]) 07/02/2018  . Skin cancer    Basal cell- face,head, neck,  arms, legs, Back  . Urinary frequency 02/19/2017    Patient Active Problem List   Diagnosis Date Noted  . Toe ulcer ([place]) 09/29/2019  . Pressure injury of skin 09/22/2019  . Vagal reaction 09/20/2019  . Cerumen debris on tympanic membrane of both ears 08/24/2019  . Type 1 diabetes mellitus with diabetic neuropathy, unspecified ([place]) 02/22/2019  . Memory loss, short term  02/22/2019  . Sepsis secondary to UTI ([place]) 07/02/2018  . HCAP (healthcare-associated pneumonia) 07/02/2018  . CAP (community acquired pneumonia) 04/16/2018  . Dementia without behavioral disturbance ([place]) 04/16/2018  . Dehydration 04/16/2018  . Aspiration pneumonia ([place]) 01/31/2018  . Diarrhea 11/25/2017  . PAD (peripheral artery disease) ([place]) 08/27/2017  . Great toe amputation status, left 07/30/2017  . Osteomyelitis of toe of left foot ([place]) 06/29/2017  . Acute bronchitis with bronchospasm 03/16/2017  . Type 2 diabetes mellitus ([place]) 03/16/2017  . Hypomagnesemia 03/16/2017  . 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 Postural dizziness with presyncope 02/19/2017  . Polyneuropathy 02/19/2017  . Numbness 02/19/2017  . Urinary frequency 02/19/2017  . Genetic testing 02/09/2017  . Family history of breast cancer   . Family history of colon cancer   . Other dysphagia 01/21/2016  . Bone metastases ([place]) 08/21/2015  . Prostate cancer ([place]) 09/08/2013  . Hypertrophy of prostate with urinary obstruction and other lower urinary tract symptoms (LUTS) 08/10/2013  . NHL (non-Hodgkin's lymphoma) ([place]) 08/18/2011  . GIST (gastrointestinal stromal tumor), malignant ([place]) 08/18/2011  . Long term (current) use of anticoagulants 07/18/2010  . Essential hypertension 10/04/2008  . ATRIAL FIBRILLATION 10/04/2008    Past Surgical History:  Procedure Laterality Date  . ABDOMINAL AORTOGRAM W/LOWER EXTREMITY N/A 09/20/2019   Procedure: ABDOMINAL AORTOGRAM W/LOWER EXTREMITY;  Surgeon: Marty Heck, MD;  Location: [facility];  Service: Cardiovascular;  Laterality: N/A;  . AMPUTATION Left 06/29/2017   Procedure: AMPUTATION LEFT GREAT TOE;  Surgeon: [name], MD;  Location: [facility];  Service: Vascular;  Laterality: Left;  . AMPUTATION Left 08/27/2017   Procedure: AMPUTATION Left SECOND TOE;  Surgeon: [name], MD;  Location: [facility];  Service: Vascular;  Laterality:  Left;  . CATARACT EXTRACTION Bilateral 2015  . CHOLECYSTECTOMY    . COLON RESECTION     small bowel  . CYSTOSCOPY WITH LITHOLAPAXY N/A 08/10/2013   Procedure: CYSTOSCOPY WITH LITHOLAPAXY WITH [name];  Surgeon: [name], MD;  Location: [facility];  Service: Urology;  Laterality: N/A;  . ESOPHAGEAL DILATION N/A 02/27/2016   Procedure: ESOPHAGEAL DILATION;  Surgeon: [name], MD;  [facility];  Service: Endoscopy;  Laterality: N/A;  . ESOPHAGEAL DILATION N/A 07/07/2018   Procedure: ESOPHAGEAL DILATION;  Surgeon: [name], MD;  [facility];  Service: Endoscopy;  Laterality: N/A;  . ESOPHAGOGASTRODUODENOSCOPY N/A 02/27/2016   Procedure: ESOPHAGOGASTRODUODENOSCOPY (EGD);  Surgeon: [name], MD;  [facility];  Service: Endoscopy;  Laterality: N/A;  12:00  . ESOPHAGOGASTRODUODENOSCOPY N/A 07/07/2018   Procedure: ESOPHAGOGASTRODUODENOSCOPY (EGD);  Surgeon: [name], MD;  [facility];  Service: Endoscopy;  Laterality: N/A;  . INCISION AND DRAINAGE PERIRECTAL ABSCESS    . LOWER EXTREMITY ANGIOGRAPHY N/A 06/25/2017   Procedure: LOWER EXTREMITY ANGIOGRAPHY;  Surgeon: [name], MD;  Location: [facility];  Service: Cardiovascular;  Laterality: N/A;  . LYMPH  NODE DISSECTION Left    '05-neck  . PERIPHERAL VASCULAR ATHERECTOMY Right 09/20/2019   Procedure: PERIPHERAL VASCULAR ATHERECTOMY;  Surgeon: Marty Heck, MD;  Location: [facility];  Service: Cardiovascular;  Laterality: Right;  Posterior tibial  . PORT-A-CATH REMOVAL    . PORTACATH PLACEMENT     insertion and removal -last chemotherapy 10 yrs ago  . TRANSURETHRAL RESECTION OF PROSTATE N/A 08/10/2013   Procedure: TRANSURETHRAL RESECTION OF THE PROSTATE WITH GYRUS INSTRUMENTS;  Surgeon: [name], MD;  [facility];  Service: Urology;  Laterality: N/A;       Family History  Problem Relation Age of Onset  . Heart attack  Mother        died in her 74s  . Other Father 13       pneumonia - died  . Breast cancer Sister        dx in her 20s-60s  . Colon cancer Brother        dx in his 59s  . Cancer Sister        TAH/BSO due to "male cancer"  . Breast cancer Daughter 39  . Breast cancer Daughter 78       DCIS - ATM VUS on [employer] 83 gene panel in 2018  . Breast cancer Daughter 57       DCIS - Negative on [employer] MyRisk 25 gene apnel in 2015  . Thyroid cancer Daughter 25       Medullary and Papillary  . Thyroid nodules Grandchild     Social History   Tobacco Use  . Smoking status: Never Smoker  . Smokeless tobacco: Never Used  Vaping Use  . Vaping Use: Never used  Substance Use Topics  . Alcohol use: No  . Drug use: No    Home Medications Prior to Admission medications   Medication Sig Start Date End Date Taking? Authorizing Provider  abiraterone acetate (ZYTIGA) 250 MG tablet Take 1 tablet (250 mg total) by mouth daily before breakfast. Take on an empty stomach 1 hour before or 2 hours after a meal 01/26/20  Yes [name], MD  acetaminophen (TYLENOL) 500 MG tablet Take 1 tablet (500 mg total) by mouth 3 (three) times daily. Patient taking differently: Take 500 mg by mouth at bedtime.  09/23/19  Yes Regalado, Belkys A, MD  apixaban (ELIQUIS) 5 MG TABS tablet Take 5 mg by mouth 2 (two) times daily.   Yes [provider]  Aspirin 81 MG CAPS Take 81 mg by mouth daily.    Yes [provider]  Cholecalciferol (VITAMIN D3) 1000 units CAPS Take 1,000 Units by mouth daily.    Yes [provider]  Cranberry 500 MG TABS Take 500 mg by mouth daily.    Yes [provider]  denosumab (XGEVA) 120 MG/1.7ML SOLN injection Inject 120 mg into the skin every 30 (thirty) days.   Yes [provider]  diltiazem (CARDIZEM SR) 90 MG 12 hr capsule Take 1 capsule (90 mg total) by mouth 2 (two) times daily. 12/15/19  Yes BranchAlphonse Guild, MD  docusate sodium (COLACE)  100 MG capsule Take 100 mg by mouth daily.   Yes [provider]  donepezil (ARICEPT) 10 MG tablet Take 1 tablet (10 mg total) by mouth daily. 12/12/19  Yes [name], NP  doxycycline (VIBRAMYCIN) 100 MG capsule Take 100 mg by mouth 2 (two) times daily. 10 day supply 02/12/20  Yes [provider]  FIBER PO Take  2 tablets by mouth daily.   Yes [provider]  glipiZIDE (GLUCOTROL XL) 5 MG 24 hr tablet Take 5 mg by mouth daily with breakfast.   Yes [provider]  Leuprolide Acetate, 6 Month, (LUPRON DEPOT, 35-MONTH,) 45 MG injection Inject 45 mg into the muscle every 6 (six) months.   Yes [provider]  lisinopril (ZESTRIL) 20 MG tablet Take 1 tablet (20 mg total) by mouth in the morning and at bedtime. 11/22/19 02/22/20 Yes BranchAlphonse Guild, MD  metFORMIN (GLUCOPHAGE) 850 MG tablet Take 850 mg by mouth 2 (two) times daily with a meal.  03/23/17  Yes [provider]  mirtazapine (REMERON) 15 MG tablet Take 1 tablet (15 mg total) by mouth at bedtime. 04/18/19 08/24/48 Yes Corum, Rex Kras, MD  Multiple Vitamin (MULTIVITAMIN WITH MINERALS) TABS tablet Take 1 tablet by mouth daily.    Yes [provider]  predniSONE (DELTASONE) 5 MG tablet Take 2.5 mg by mouth 2 (two) times daily.  12/10/19  Yes [provider]  Probiotic Product (PROBIOTIC DAILY PO) Take 1 tablet by mouth daily. Every morning    Yes [provider]  simethicone (MYLICON) 80 MG chewable tablet Chew 80 mg by mouth daily.   Yes [provider]  traMADol (ULTRAM) 50 MG tablet Take 1 tablet (50 mg total) by mouth every 6 (six) hours as needed for moderate pain. 01/16/20  Yes Marty Heck, MD  trimethoprim (TRIMPEX) 100 MG tablet Take 100 mg by mouth daily.   Yes [provider]  blood glucose meter kit and supplies Dispense based on patient and insurance preference. Use four times daily as directed. (FOR ICD-10 E10.9, E11.9). 03/30/19    Corum, Rex Kras, MD  clindamycin (CLEOCIN) 150 MG capsule Take 1 capsule (150 mg total) by mouth 3 (three) times daily for 7 days. 02/22/20 02/29/20  Melton Walls, DO  diclofenac Sodium (VOLTAREN) 1 % GEL Apply 2 g topically 4 (four) times daily. Patient not taking: Reported on 02/22/2020 09/23/19   Regalado, Jerald Kief A, MD  glipiZIDE (GLUCOTROL) 5 MG tablet Take 1 tablet (5 mg total) by mouth daily before breakfast. Patient not taking: Reported on 02/22/2020 01/16/20   [name], NP  glucose blood (EASYMAX TEST) test strip Use as instructed 08/24/19   Maryruth Hancock, MD  Lancets 28G MISC 1 kit by Does not apply route every morning. 07/18/19   Corum, Rex Kras, MD  nitroGLYCERIN (NITRODUR - DOSED IN MG/24 HR) 0.2 mg/hr patch Place 1 patch (0.2 mg total) onto the skin daily. Patient not taking: Reported on 02/20/2020 09/27/19   Marty Heck, MD    Allergies    Tape and Augmentin [amoxicillin-pot clavulanate]  Review of Systems   Review of Systems  Constitutional: Negative for chills and fever.  HENT: Negative for ear pain and sore throat.   Eyes: Negative for pain and visual disturbance.  Respiratory: Negative for cough and shortness of breath.   Cardiovascular: Negative for chest pain and palpitations.  Gastrointestinal: Negative for abdominal pain and vomiting.  Genitourinary: Negative for dysuria and hematuria.  Musculoskeletal: Negative for arthralgias and back pain.  Skin: Positive for color change, rash and wound.  Neurological: Negative for seizures, syncope and headaches.  All other systems reviewed and are negative.   Physical Exam Updated Vital Signs  ED Triage Vitals [02/22/20 0949]  Enc Vitals Group     BP (!) 129/49     Pulse Rate 78  Resp 17     Temp 97.7 F (36.5 C)     Temp Source Oral     SpO2 100 %     Weight 140 lb (63.5 kg)     Height _0  (1.778 m)     Head Circumference      Peak Flow      Pain Score      Pain Loc      Pain Edu?      Excl. in  [place]?     Physical Exam Vitals and nursing note reviewed.  Constitutional:      Appearance: He is well-developed.  HENT:     Head: Normocephalic and atraumatic.  Eyes:     Extraocular Movements: Extraocular movements intact.     Conjunctiva/sclera: Conjunctivae normal.     Pupils: Pupils are equal, round, and reactive to light.  Cardiovascular:     Rate and Rhythm: Normal rate and regular rhythm.     Heart sounds: No murmur heard.      Comments: Patient has Doppler pulses bilaterally and DP Pulmonary:     Effort: Pulmonary effort is normal. No respiratory distress.     Breath sounds: Normal breath sounds.  Abdominal:     Palpations: Abdomen is soft.     Tenderness: There is no abdominal tenderness.  Musculoskeletal:     Cervical back: Neck supple.  Skin:    General: Skin is warm and dry.     Capillary Refill: Capillary refill takes less than 2 seconds.     Findings: Erythema present.     Comments: Petechiae to bilateral lower extremities, redness to the right big toe but fairly localized with no surrounding erythema or purulent drainage, eschar to the base of the right heel that appears well-healing with no purulent drainage or surrounding erythema  Neurological:     General: No focal deficit present.     Mental Status: He is alert.     ED Results / Procedures / Treatments   Labs (all labs ordered are listed, but only abnormal results are displayed) Labs Reviewed  COMPREHENSIVE METABOLIC PANEL - Abnormal; Notable for the following components:      Result Value   Calcium 10.4 (*)    Total Protein 6.3 (*)    Albumin 3.2 (*)    AST 14 (*)    GFR, Estimated 57 (*)    All other components within normal limits  CBC WITH DIFFERENTIAL/PLATELET - Abnormal; Notable for the following components:   RBC 3.12 (*)    Hemoglobin 9.5 (*)    HCT 29.2 (*)    Monocytes Absolute 1.5 (*)    Abs Immature Granulocytes 0.09 (*)    All other components within normal limits  RESPIRATORY  PANEL BY RT PCR (FLU A&B, COVID)  CULTURE, BLOOD (ROUTINE X 2)  CULTURE, BLOOD (ROUTINE X 2)  LACTIC ACID, PLASMA    EKG None  Radiology DG Chest 2 View  Result Date: 02/22/2020 CLINICAL DATA:  Fever.  Weakness. EXAM: CHEST - 2 VIEW COMPARISON:  09/21/2019. FINDINGS: Low lung volumes with bibasilar atelectasis. Right base infiltrate cannot be excluded. No pleural effusion or pneumothorax. Heart size stable. IMPRESSION: Low lung volumes with bibasilar atelectasis. Right base infiltrate cannot be excluded. Electronically Signed   By: Marcello Moores  Register   On: 02/22/2020 10:16   DG Foot Complete Right  Result Date: 02/22/2020 CLINICAL DATA:  Diabetic, right big toe pain, open wounds dorsal foot EXAM: RIGHT FOOT COMPLETE - 3+ VIEW COMPARISON:  None. FINDINGS: Dorsiflexion at the MTP joints and plantar flexion at the PIP joints. No erosive changes are identified. No periosteal reaction. Degenerative changes at the interphalangeal joint of the first digit. Extensive vascular calcifications. IMPRESSION: No radiographic evidence of osteomyelitis. Degenerative changes at the interphalangeal joint of the first digit which should. Electronically Signed   By: Macy Mis M.D.   On: 02/22/2020 10:41    Procedures Procedures (including critical care time)  Medications Ordered in ED Medications - No data to display  ED Course  I have reviewed the triage vital signs and the nursing notes.  Pertinent labs & imaging results that were available during my care of the patient were reviewed by me and considered in my medical decision making (see chart for details).    MDM Rules/Calculators/A&P                          [name] is a 86 year old male with history of hypertension, atrial fibrillation on blood thinner, PVD, diabetes who presents the ED for evaluation of wound to the right big toe.  Unremarkable vitals.  No fever.  Patient on a second round of antibiotics.  Was on Bactrim and now  doxycycline.  Has follow-up with a podiatrist, wound care and vascular surgery for this.  Overall appears to be a fairly localized infection to the right big toe.  He does have a chronic eschar to the right heel that appears well-healing as well.  He has some petechiae to bilateral lower extremities but there is no surrounding erythema to these wounds or crepitus.  He has Doppler pulses on exam.  Lab work shows no significant leukocytosis or lactic acidosis.  Have low suspicion for systemic infection.  X-ray did not show any evidence of osteomyelitis or major inflammation.  Overall believe this is likely more of an ischemic process given his history of PVD.  He does follow with vascular surgery and Dr. Stanford Breed to come down to the ED to evaluate the patient.  He is due to finish his antibiotics today and has wound care follow-up next week.  Does not appear to be septic in do not believe he would benefit from IV antibiotics at this time but will discuss with vascular surgery.  Vascular surgery discussed with family about any surgical options which they overall declined.  Would not probably do very well with that type of procedure where he would likely need a below-knee amputation.  Overall lab work was unremarkable.  Palliative care consult has been placed and they will evaluate the patient in the ED to establish care with them.  Overall shared decision was made with the family to trial another round of antibiotics and therefore will start clindamycin and have patient follow-up with wound care.  Discharged in good condition.  This chart was dictated using voice recognition software.  Despite best efforts to proofread,  errors can occur which can change the documentation meaning.     Final Clinical Impression(s) / ED Diagnoses Final diagnoses:  Cellulitis, unspecified cellulitis site    Rx / DC Orders ED Discharge Orders         Ordered    Amb Referral to Palliative Care  Status:  Canceled         02/22/20 1258    clindamycin (CLEOCIN) 150 MG capsule  3 times daily        02/22/20 1429           Meshell Abdulaziz,  [employer], DO 02/22/20 1429

## 2020-02-22 NOTE — Discharge Instructions (Signed)
Continue follow-up with wound care clinic and palliative care.  Take antibiotic as prescribed.  Please return if symptoms worsen.

## 2020-02-22 NOTE — Consult Note (Signed)
Patient name: [name] MRN: [id number] DOB: 23-May-1923 Sex: male  REASON FOR VISIT: Follow-up for right heel wound  HISTORY OF PRESENT ILLNESS:  Previously: [name] is a 86 y.o. male with history of diabetes, atrial fibrillation, hypertension, hyperlipidemia, PAD that presents for ongoing follow-up of right foot tissue loss.  He previously underwent right posterior tibial intervention for critical limb ischemia with tissue loss (great toe ulcer) on 09/20/19.  Postop course was complicated by syncopal event while he was having a bowel movement in recovery and he was admitted for observation.  He then had ongoing issues with orthostasis and this was felt to be possibly related to adrenal insufficiency but as treated with steroids and appreciate the hospitalist input.  Ultimately during PT intervention he had an embolic event to the heel.  He has been going to wound clinic with the help of Dr. Dellia Nims.  Pain is much better and now has just taking a quarter of a tramadol at night.  The wound on the heel has remained dry.  The right great toe ulcer is also stable.  No active signs of infection.    02/22/20:    Past Medical History:  Diagnosis Date  . Acute bronchitis   . Acute lower UTI 11/25/2017  . Amputation of toe of left foot ([place]) 07/30/2017, 08/27/2017    Great toe 07/30/2017 Second toe 08/27/2017   . Arthritis   . Aspiration pneumonia ([place]) 01/31/2018  . Atrial fibrillation ([place])    Dr. Harl Bowie- [facility] follows saw 11'14  . Bladder stones    tx. with oral meds and antibiotics, now surgery planned  . Bone metastases ([place]) 08/21/2015  . Cataracts, both eyes    surgery planned May 2015  . Community acquired pneumonia 04/16/2018  . Cystoid macular edema 04/21/2015    Right eye 04/21/2015   . Dehydration 04/16/2018  . Dementia without behavioral disturbance ([place]) 04/16/2018  . Diabetes mellitus without complication ([place])    Type II  . Diarrhea 11/25/2017  . Dyspnea     with activity  . Dysrhythmia    Afib  . Family history of breast cancer   . Family history of colon cancer   . Genetic testing 02/09/2017  . GERD (gastroesophageal reflux disease)   . 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 Margot Chimes, thus far without recurrence.   Marland Kitchen History of MRI of lumbar spine 03/05/2014  . Hypertension   . Hypertrophy of prostate with urinary obstruction   . Hypomagnesemia 03/16/2017  . Metabolic encephalopathy 62/70/3500  . Metastatic adenocarcinoma to prostate ([place]) 09/08/2013  . 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 NHL (non-Hodgkin's lymphoma) ([place]) 2005   Diffuse large B-cell lymphoma, clinically stage IIIA, CD20 positive, status post cervical lymph node biopsy 08/03/2003 on the left. PET scan was also positive in the spleen and small bowel region, but bone marrow aspiration and biopsy were negative. So he essentially had stage IIIAs. He received R-CHOP x6 cycles with CR established by PET scan criteria on 11/23/2003 with no evidence for relapse th  . Numbness 02/19/2017   Per [place] new patient packet  . Osteomyelitis () 06/29/2017   toe of left foot   . PAD (peripheral artery disease) ([place]) 08/27/2017  . Polyneuropathy 02/19/2017  . Postural  dizziness with presyncope 02/19/2017  . S/P TURP 08/10/2013  . Sepsis ([place]) 07/02/2018  . Skin cancer    Basal cell- face,head, neck,  arms, legs, Back  . Urinary frequency 02/19/2017    Past Surgical History:  Procedure Laterality Date  . ABDOMINAL AORTOGRAM W/LOWER EXTREMITY N/A 09/20/2019   Procedure: ABDOMINAL AORTOGRAM W/LOWER EXTREMITY;  Surgeon: Marty Heck, MD;  Location: [facility];  Service: Cardiovascular;  Laterality: N/A;  . AMPUTATION Left  06/29/2017   Procedure: AMPUTATION LEFT GREAT TOE;  Surgeon: [name], MD;  Location: [facility];  Service: Vascular;  Laterality: Left;  . AMPUTATION Left 08/27/2017   Procedure: AMPUTATION Left SECOND TOE;  Surgeon: [name], MD;  Location: [facility];  Service: Vascular;  Laterality: Left;  . CATARACT EXTRACTION Bilateral 2015  . CHOLECYSTECTOMY    . COLON RESECTION     small bowel  . CYSTOSCOPY WITH LITHOLAPAXY N/A 08/10/2013   Procedure: CYSTOSCOPY WITH LITHOLAPAXY WITH [name];  Surgeon: [name], MD;  [facility];  Service: Urology;  Laterality: N/A;  . ESOPHAGEAL DILATION N/A 02/27/2016   Procedure: ESOPHAGEAL DILATION;  Surgeon: [name], MD;  [facility];  Service: Endoscopy;  Laterality: N/A;  . ESOPHAGEAL DILATION N/A 07/07/2018   Procedure: ESOPHAGEAL DILATION;  Surgeon: [name], MD;  [facility];  Service: Endoscopy;  Laterality: N/A;  . ESOPHAGOGASTRODUODENOSCOPY N/A 02/27/2016   Procedure: ESOPHAGOGASTRODUODENOSCOPY (EGD);  Surgeon: [name], MD;  [facility];  Service: Endoscopy;  Laterality: N/A;  12:00  . ESOPHAGOGASTRODUODENOSCOPY N/A 07/07/2018   Procedure: ESOPHAGOGASTRODUODENOSCOPY (EGD);  Surgeon: [name], MD;  [facility];  Service: Endoscopy;  Laterality: N/A;  . INCISION AND DRAINAGE PERIRECTAL ABSCESS    . LOWER EXTREMITY ANGIOGRAPHY N/A 06/25/2017   Procedure: LOWER EXTREMITY ANGIOGRAPHY;  Surgeon: [name], MD;  Location: [facility];  Service: Cardiovascular;  Laterality: N/A;  . LYMPH NODE DISSECTION Left    '05-neck  . PERIPHERAL VASCULAR ATHERECTOMY Right 09/20/2019   Procedure: PERIPHERAL VASCULAR ATHERECTOMY;  Surgeon: Marty Heck, MD;  Location: [facility];  Service: Cardiovascular;  Laterality: Right;  Posterior tibial  . PORT-A-CATH REMOVAL    . PORTACATH PLACEMENT     insertion and removal -last chemotherapy 10 yrs ago  .  TRANSURETHRAL RESECTION OF PROSTATE N/A 08/10/2013   Procedure: TRANSURETHRAL RESECTION OF THE PROSTATE WITH GYRUS INSTRUMENTS;  Surgeon: [name], MD;  [facility];  Service: Urology;  Laterality: N/A;    Family History  Problem Relation Age of Onset  . Heart attack Mother        died in her 55s  . Other Father 59       pneumonia - died  . Breast cancer Sister        dx in her 33s-60s  . Colon cancer Brother        dx in his 65s  . Cancer Sister        TAH/BSO due to "male cancer"  . Breast cancer Daughter 67  . Breast cancer Daughter 68       DCIS - ATM VUS on [employer] 83 gene panel in 2018  . Breast cancer Daughter 6       DCIS - Negative on [employer] MyRisk 25 gene apnel in 2015  . Thyroid cancer Daughter 73       Medullary and Papillary  . Thyroid nodules Grandchild  SOCIAL HISTORY: Social History   Tobacco Use  . Smoking status: Never Smoker  . Smokeless tobacco: Never Used  Substance Use Topics  . Alcohol use: No    Allergies  Allergen Reactions  . Tape Other (See Comments)    SKIN IS VERY THIN AND WILL TEAR AND BRUISE VERY EASILY!!!!  . Augmentin [Amoxicillin-Pot Clavulanate] Rash and Other (See Comments)    Redness of skin    No current facility-administered medications for this encounter.   Current Outpatient Medications  Medication Sig Dispense Refill  . abiraterone acetate (ZYTIGA) 250 MG tablet Take 1 tablet (250 mg total) by mouth daily before breakfast. Take on an empty stomach 1 hour before or 2 hours after a meal 120 tablet 0  . acetaminophen (TYLENOL) 500 MG tablet Take 1 tablet (500 mg total) by mouth 3 (three) times daily. (Patient taking differently: Take 500 mg by mouth at bedtime. ) 30 tablet 0  . apixaban (ELIQUIS) 5 MG TABS tablet Take 5 mg by mouth 2 (two) times daily.    . Aspirin 81 MG CAPS Take 81 mg by mouth daily.     . Cholecalciferol (VITAMIN D3) 1000 units CAPS Take 1,000 Units by mouth daily.     . Cranberry 500 MG  TABS Take 500 mg by mouth daily.     Marland Kitchen denosumab (XGEVA) 120 MG/1.7ML SOLN injection Inject 120 mg into the skin every 30 (thirty) days.    Marland Kitchen diltiazem (CARDIZEM SR) 90 MG 12 hr capsule Take 1 capsule (90 mg total) by mouth 2 (two) times daily. 60 capsule 11  . docusate sodium (COLACE) 100 MG capsule Take 100 mg by mouth daily.    Marland Kitchen donepezil (ARICEPT) 10 MG tablet Take 1 tablet (10 mg total) by mouth daily. 90 tablet 1  . doxycycline (VIBRAMYCIN) 100 MG capsule Take 100 mg by mouth 2 (two) times daily. 10 day supply    . FIBER PO Take 2 tablets by mouth daily.    Marland Kitchen glipiZIDE (GLUCOTROL XL) 5 MG 24 hr tablet Take 5 mg by mouth daily with breakfast.    . Leuprolide Acetate, 6 Month, (LUPRON DEPOT, 30-MONTH,) 45 MG injection Inject 45 mg into the muscle every 6 (six) months.    Marland Kitchen lisinopril (ZESTRIL) 20 MG tablet Take 1 tablet (20 mg total) by mouth in the morning and at bedtime. 60 tablet 6  . metFORMIN (GLUCOPHAGE) 850 MG tablet Take 850 mg by mouth 2 (two) times daily with a meal.     . mirtazapine (REMERON) 15 MG tablet Take 1 tablet (15 mg total) by mouth at bedtime. 30 tablet 0  . Multiple Vitamin (MULTIVITAMIN WITH MINERALS) TABS tablet Take 1 tablet by mouth daily.     . predniSONE (DELTASONE) 5 MG tablet Take 2.5 mg by mouth 2 (two) times daily.     . Probiotic Product (PROBIOTIC DAILY PO) Take 1 tablet by mouth daily. Every morning     . simethicone (MYLICON) 80 MG chewable tablet Chew 80 mg by mouth daily.    . traMADol (ULTRAM) 50 MG tablet Take 1 tablet (50 mg total) by mouth every 6 (six) hours as needed for moderate pain. 30 tablet 0  . trimethoprim (TRIMPEX) 100 MG tablet Take 100 mg by mouth daily.    . blood glucose meter kit and supplies Dispense based on patient and insurance preference. Use four times daily as directed. (FOR ICD-10 E10.9, E11.9). 1 each 0  . diclofenac Sodium (VOLTAREN) 1 % GEL  Apply 2 g topically 4 (four) times daily. (Patient not taking: Reported on 02/22/2020)  150 g 0  . glipiZIDE (GLUCOTROL) 5 MG tablet Take 1 tablet (5 mg total) by mouth daily before breakfast. (Patient not taking: Reported on 02/22/2020) 90 tablet 1  . glucose blood (EASYMAX TEST) test strip Use as instructed 100 each 12  . Lancets 28G MISC 1 kit by Does not apply route every morning. 100 each 1  . nitroGLYCERIN (NITRODUR - DOSED IN MG/24 HR) 0.2 mg/hr patch Place 1 patch (0.2 mg total) onto the skin daily. (Patient not taking: Reported on 02/20/2020) 30 patch 12    REVIEW OF SYSTEMS:  _0  denotes positive finding, _1  denotes negative finding Cardiac  Comments:  Chest pain or chest pressure:    Shortness of breath upon exertion:    Short of breath when lying flat:    Irregular heart rhythm:        Vascular    Pain in calf, thigh, or hip brought on by ambulation:    Pain in feet at night that wakes you up from your sleep:     Blood clot in your veins:    Leg swelling:         Pulmonary    Oxygen at home:    Productive cough:     Wheezing:         Neurologic    Sudden weakness in arms or legs:     Sudden numbness in arms or legs:     Sudden onset of difficulty speaking or slurred speech:    Temporary loss of vision in one eye:     Problems with dizziness:         Gastrointestinal    Blood in stool:     Vomited blood:         Genitourinary    Burning when urinating:     Blood in urine:        Psychiatric    Major depression:         Hematologic    Bleeding problems:    Problems with blood clotting too easily:        Skin    Rashes or ulcers:        Constitutional    Fever or chills:      PHYSICAL EXAM: Vitals:   02/22/20 0949 02/22/20 1041 02/22/20 1107 02/22/20 1113  BP: (!) 129/49 140/71  (!) 150/72  Pulse: 78  64 68  Resp: 17 (!) _2 Temp: 97.7 F (36.5 C)     TempSrc: Oral     SpO2: 100%  97% 100%  Weight: 63.5 kg     Height: _3  (1.778 m)       GENERAL: The patient is a well-nourished male, in no acute distress. The vital  signs are documented above. CARDIAC: There is a regular rate and rhythm.  VASCULAR:  Right heel wound appears similar to September evaluation Right toe wound appears worse than previous - cellulitis confined to the hallux Non-palpable pedal pulses  DATA:   None  Assessment/Plan:  86 year old male who presented with critical limb ischemia of the right lower extremity with a nonhealing wound to the great toe.  Ultimately he had severe tibial disease and dominant runoff was through the anterior tibial artery that was patent down to the ankle but had very poor outflow due to small vessel disease.  Ultimately underwent posterior tibial intervention for severely calcified artery with chronic  total occlusion distally and likely had some atheroembolism to the heel.    The heel has no signs of infection.  The right great toe ulcer worsens.  I again discussed very low expectations for the toe ulcer to heal at this point but as long as this is stable along with the heel ulcer continue conservative management. He is not interested in an above knee amputation. I encouraged the patient to transition to palliative care. He is too frail to consider open operation. Further endovascular management is not likely to succeed. Please call with questions.   Yevonne Aline. Stanford Breed, MD [facility] Phone Number: [phone] 02/22/2020 11:37 AM

## 2020-02-22 NOTE — Consult Note (Signed)
Consultation Note Date: 02/22/2020   Patient Name: [name]  DOB: 1924-02-19  MRN: [id number]  Age / Sex: 86 y.o., male  PCP: [name], NP Referring Physician: Lennice Sites, DO  Reason for Consultation: Establishing goals of care  HPI/Patient Profile: 86 y.o. male  with past medical history of prostate cancer, PAD, DM, atrial fibrillation, amputation of toe on left foot, non-Hodgkin's lymphoma, chronic non-healing wound to right great toe, dementia was seen in the ED on 02/22/20 for altered mental status and fever at home per daughter.   ED Course: Vascular surgery discussed with patient and family about above the knee amputation - patient declined surgical intervention. Vascular has low expectations for the toe ulcer to heal - they recommend conservative management with palliative care following. Overall lab work was unremarkable.  Palliative care consult has been placed and they will evaluate the patient in the ED to establish care with them.  Overall shared decision was made with the family to trial another round of antibiotics and therefore will start clindamycin and have patient follow-up with wound care.  Patient and family face treatment option decisions, advanced directive decisions, and anticipatory care needs.   Clinical Assessment and Goals of Care: I have reviewed medical records including EPIC notes, labs, and imaging. Received report from Dr. Ronnald Nian.   Went to visit patient at bedside - daughter/[name] was present. Patient was lying in bed awake, alert, oriented, and able to participate in conversation; however, he was drowsy and intermittently seemed to fall asleep during discussion. Patient stated he was only in "a little pain" due to laying down for so long - he declined intervention. He expressed he was ready to go home. No respiratory distress, increased work of breathing, or  secretions noted.   Met with patient and [name]  to discuss diagnosis, prognosis, GOC, EOL wishes, disposition, and options.  I introduced Palliative Medicine as specialized medical care for people living with serious illness. It focuses on providing relief from the symptoms and stress of a serious illness. The goal is to improve quality of life for both the patient and the family.  We discussed a brief life review of the patient as well as functional and nutritional status. The patient lives at home with his wife. They have 4 children together, 3 daughters and 1 son. [name] has lived with the patient and his wife since May and one of her sisters lives next door. Caregivers come to the patient's home 8.5 hours per day M-F as well as most of the day Saturday and half the day Sunday. [name] states that the patient is able to ambulate with a walker, but does need additional standby assistance. Patient has had many falls, with the most recent being in mid-October. [name] states that the patient over the last several weeks has not been eating or drinking well.   We discussed patient's current illness and what it means in the larger context of patient's on-going co-morbidities. [name] had a clear understanding of the patient's current medical  situation. Natural disease trajectory and expectations at EOL were discussed. I attempted to elicit values and goals of care important to the patient. The difference between aggressive medical intervention and comfort care was considered in light of the patient's goals of care. [name] explained that the patient is already being followed by outpatient Palliative Care through [facility] as well as [facility]. [name] described the wonderful relationship [facility] and the patient have developed over the years. The difference between Hospice and Palliative Care services outpatient were explained in detail per [facility] request - printed education material was provided to  [facility].   Discussed the patient's high risk of rehospitalization - [name] expressed understanding. I introduced the concept of a comfort path to patient and family and encouraged them to think about at what point he would want to stop aggressive medical interventions and focus on helping him feel as best he can for as long as he can outside the hospital, with the goal of comfort rather than cure/prolonging life. Explained that in the future, if the goal is to not be rehospitalized, the patient can transition to hospice easily for symptom management needs since he is already being followed by outpatient Palliative Care. Encouraged [name] to call [facility] soon and update them on the patient's current/updated medical situation. Also encouraged her to continue goals of care discussions with them and the patient. Provided therapeutic listening as [name] expressed her thoughts and feelings around current situation - she stated this is something they have been expecting and knew was coming for the patient. Provided validation that this is still a hard and emotional situation.    Visit also consisted of discussions dealing with the complex and emotionally intense issues of symptom management and palliative care in the setting of serious and potentially life-threatening illness. Palliative care team will continue to support patient, patient's family, and medical team.  Discussed with patient/family the importance of continued conversation with each other and the medical providers regarding overall plan of care and treatment options, ensuring decisions are within the context of the patient's values and GOCs.    Questions and concerns were addressed. The patient/family was encouraged to call with questions and/or concerns. PMT card was provided.   Primary Decision Maker: PATIENT    SUMMARY OF RECOMMENDATIONS:  Continue current medical treatment - discharge with antibiotics  Continue DNR/DNI as  previously documented  Patient is already being followed by outpatient Palliative Care through [facility] - encouraged daughter/[name] to call them soon and notify them of patient's current/updated medical situation. Encouraged continued [place] discussions as patient is most likely nearing end of life.  PMT will continue to follow peripherally. If there are any imminent needs please call the service directly  Code Status/Advance Care Planning:  DNR  Palliative Prophylaxis:   Aspiration, Bowel Regimen, Delirium Protocol, Frequent Pain Assessment, Oral Care and Turn Reposition  Additional Recommendations (Limitations, Scope, Preferences):  Full Scope Treatment  Psycho-social/Spiritual:  Created space and opportunity for patient and family to express thoughts and feelings regarding patient's current medical situation.   Emotional support provided.  Prognosis:   < 6 months  Discharge Planning: Home with Palliative Services      Primary Diagnoses: Present on Admission: **None**   I have reviewed the medical record, interviewed the patient and family, and examined the patient. The following aspects are pertinent.  Past Medical History:  Diagnosis Date  . Acute bronchitis   . Acute lower UTI 11/25/2017  . Amputation of toe  of left foot ([place]) 07/30/2017, 08/27/2017    Great toe 07/30/2017 Second toe 08/27/2017   . Arthritis   . Aspiration pneumonia ([place]) 01/31/2018  . Atrial fibrillation ([place])    Dr. Harl Bowie- [facility] follows saw 11'14  . Bladder stones    tx. with oral meds and antibiotics, now surgery planned  . Bone metastases ([place]) 08/21/2015  . Cataracts, both eyes    surgery planned May 2015  . Community acquired pneumonia 04/16/2018  . Cystoid macular edema 04/21/2015    Right eye 04/21/2015   . Dehydration 04/16/2018  . Dementia without behavioral disturbance ([place]) 04/16/2018  . Diabetes mellitus without complication ([place])    Type II  . Diarrhea  11/25/2017  . Dyspnea    with activity  . Dysrhythmia    Afib  . Family history of breast cancer   . Family history of colon cancer   . Genetic testing 02/09/2017  . GERD (gastroesophageal reflux disease)   . 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 Margot Chimes, thus far without recurrence.   Marland Kitchen History of MRI of lumbar spine 03/05/2014  . Hypertension   . Hypertrophy of prostate with urinary obstruction   . Hypomagnesemia 03/16/2017  . Metabolic encephalopathy 78/29/5621  . Metastatic adenocarcinoma to prostate ([place]) 09/08/2013  . 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 NHL (non-Hodgkin's lymphoma) ([place]) 2005   Diffuse large B-cell lymphoma, clinically stage IIIA, CD20 positive, status post cervical lymph node biopsy 08/03/2003 on the left. PET scan was also positive in the spleen and small bowel region, but bone marrow aspiration and biopsy were negative. So he essentially had stage IIIAs. He received R-CHOP x6 cycles with CR established by PET scan criteria on 11/23/2003 with no evidence for relapse th  . Numbness 02/19/2017   Per [facility] new patient packet  . Osteomyelitis ([place]) 06/29/2017   toe of left foot   . PAD (peripheral artery disease) ([place]) 08/27/2017  . Polyneuropathy 02/19/2017  . Postural dizziness with presyncope 02/19/2017  . S/P TURP 08/10/2013  . Sepsis ([place]) 07/02/2018  . Skin cancer    Basal cell- face,head, neck,  arms, legs, Back  . Urinary frequency 02/19/2017   Social History   Socioeconomic History  . Marital status: Married    Spouse name: Not on file  . Number of children: Not on file  . Years of education: Not on file  . Highest education level: Not on file  Occupational History  . Occupation: retired     Fish farm manager: RETIRED    Comment: salesman  Tobacco Use  . Smoking status: Never Smoker  . Smokeless tobacco: Never Used  Vaping Use  . Vaping Use: Never used  Substance and Sexual Activity  . Alcohol use: No  . Drug use: No  . Sexual activity: Not Currently  Other Topics Concern  . Not on file  Social History Narrative   Diet: Soft foods      Do you drink/eat things with caffeine No      Marital Status: Marred   What year were you married? 1949      Do you live in a house, apartment, assisted living, condo, trailer, etc.? House      Is it one or more stories? One      How  many persons live in your home? 2         Do you have any pets in your home?(please list) No      Highest level of education completed: 9th      Current or past profession: Cross you exercise? No Type and how often:      Living Will? Yes      DNR form? NO  If not do you wish to discuss one? Yes      POA/HPOA forms? Yes      Difficulty bathing or dressing yourself? Yes      Difficulty preparing food or eating? Yes      Difficulty managing medications? Yes      Difficulty managing your finances? Yes      Difficulty affording your medications? No                     Social Determinants of Health   Financial Resource Strain:   . Difficulty of Paying Living Expenses: Not on file  Food Insecurity:   . Worried About Charity fundraiser in the Last Year: Not on file  . Ran Out of Food in the Last Year: Not on file  Transportation Needs: No Transportation Needs  . Lack of Transportation (Medical): No  . Lack of Transportation (Non-Medical): No  Physical Activity:   . Days of Exercise per Week: Not on file  . Minutes of Exercise per Session: Not on file  Stress:   . Feeling of Stress : Not on file  Social Connections:   . Frequency of Communication with Friends and Family: Not on file  . Frequency of Social Gatherings with Friends and Family: Not on file  . Attends  Religious Services: Not on file  . Active Member of Clubs or Organizations: Not on file  . Attends Archivist Meetings: Not on file  . Marital Status: Not on file   Family History  Problem Relation Age of Onset  . Heart attack Mother        died in her 58s  . Other Father 54       pneumonia - died  . Breast cancer Sister        dx in her 75s-60s  . Colon cancer Brother        dx in his 16s  . Cancer Sister        TAH/BSO due to "male cancer"  . Breast cancer Daughter 80  . Breast cancer Daughter 54       DCIS - ATM VUS on [employer]itae 83 gene panel in 2018  . Breast cancer Daughter 22       DCIS - Negative on Myriad MyRisk 25 gene apnel in 2015  . Thyroid cancer Daughter 64       Medullary and Papillary  . Thyroid nodules Grandchild    Scheduled Meds: Continuous Infusions: PRN Meds:. Medications Prior to Admission:  Prior to Admission medications   Medication Sig Start Date End Date Taking? Authorizing Provider  abiraterone acetate (ZYTIGA) 250 MG tablet Take 1 tablet (250 mg total) by mouth daily before breakfast. Take on an empty stomach 1 hour before or 2 hours after a meal 01/26/20  Yes [name], MD  acetaminophen (TYLENOL) 500 MG tablet Take 1 tablet (500 mg total) by mouth 3 (three) times daily. Patient taking differently: Take 500 mg by mouth at bedtime.  09/23/19  Yes Regalado, [employer]  A, MD  apixaban (ELIQUIS) 5 MG TABS tablet Take 5 mg by mouth 2 (two) times daily.   Yes [provider]  Aspirin 81 MG CAPS Take 81 mg by mouth daily.    Yes [provider]  Cholecalciferol (VITAMIN D3) 1000 units CAPS Take 1,000 Units by mouth daily.    Yes [provider]  Cranberry 500 MG TABS Take 500 mg by mouth daily.    Yes [provider]  denosumab (XGEVA) 120 MG/1.7ML SOLN injection Inject 120 mg into the skin every 30 (thirty) days.   Yes [provider]  diltiazem (CARDIZEM SR) 90 MG 12 hr capsule Take 1 capsule  (90 mg total) by mouth 2 (two) times daily. 12/15/19  Yes BranchAlphonse Guild, MD  docusate sodium (COLACE) 100 MG capsule Take 100 mg by mouth daily.   Yes [provider]  donepezil (ARICEPT) 10 MG tablet Take 1 tablet (10 mg total) by mouth daily. 12/12/19  Yes [name], NP  doxycycline (VIBRAMYCIN) 100 MG capsule Take 100 mg by mouth 2 (two) times daily. 10 day supply 02/12/20  Yes [provider]  FIBER PO Take 2 tablets by mouth daily.   Yes [provider]  glipiZIDE (GLUCOTROL XL) 5 MG 24 hr tablet Take 5 mg by mouth daily with breakfast.   Yes [provider]  Leuprolide Acetate, 6 Month, (LUPRON DEPOT, 41-MONTH,) 45 MG injection Inject 45 mg into the muscle every 6 (six) months.   Yes [provider]  lisinopril (ZESTRIL) 20 MG tablet Take 1 tablet (20 mg total) by mouth in the morning and at bedtime. 11/22/19 02/22/20 Yes BranchAlphonse Guild, MD  metFORMIN (GLUCOPHAGE) 850 MG tablet Take 850 mg by mouth 2 (two) times daily with a meal.  03/23/17  Yes [provider]  mirtazapine (REMERON) 15 MG tablet Take 1 tablet (15 mg total) by mouth at bedtime. 04/18/19 08/24/48 Yes Corum, Rex Kras, MD  Multiple Vitamin (MULTIVITAMIN WITH MINERALS) TABS tablet Take 1 tablet by mouth daily.    Yes [provider]  predniSONE (DELTASONE) 5 MG tablet Take 2.5 mg by mouth 2 (two) times daily.  12/10/19  Yes [provider]  Probiotic Product (PROBIOTIC DAILY PO) Take 1 tablet by mouth daily. Every morning    Yes [provider]  simethicone (MYLICON) 80 MG chewable tablet Chew 80 mg by mouth daily.   Yes [provider]  traMADol (ULTRAM) 50 MG tablet Take 1 tablet (50 mg total) by mouth every 6 (six) hours as needed for moderate pain. 01/16/20  Yes Marty Heck, MD  trimethoprim (TRIMPEX) 100 MG tablet Take 100 mg by mouth daily.   Yes [provider]  blood glucose meter kit and supplies Dispense based  on patient and insurance preference. Use four times daily as directed. (FOR ICD-10 E10.9, E11.9). 03/30/19   Corum, Rex Kras, MD  diclofenac Sodium (VOLTAREN) 1 % GEL Apply 2 g topically 4 (four) times daily. Patient not taking: Reported on 02/22/2020 09/23/19   Regalado, Jerald Kief A, MD  glipiZIDE (GLUCOTROL) 5 MG tablet Take 1 tablet (5 mg total) by mouth daily before breakfast. Patient not taking: Reported on 02/22/2020 01/16/20   [name], NP  glucose blood (EASYMAX TEST) test strip Use as instructed 08/24/19   Maryruth Hancock, MD  Lancets 28G MISC 1 kit by Does not apply route every morning. 07/18/19   Corum, Rex Kras, MD  nitroGLYCERIN (NITRODUR - DOSED  IN MG/24 HR) 0.2 mg/hr patch Place 1 patch (0.2 mg total) onto the skin daily. Patient not taking: Reported on 02/20/2020 09/27/19   Marty Heck, MD   Allergies  Allergen Reactions  . Tape Other (See Comments)    SKIN IS VERY THIN AND WILL TEAR AND BRUISE VERY EASILY!!!!  . Augmentin [Amoxicillin-Pot Clavulanate] Rash and Other (See Comments)    Redness of skin   Review of Systems  Constitutional: Positive for fatigue.  Neurological: Positive for syncope and weakness.  All other systems reviewed and are negative.   Physical Exam Vitals and nursing note reviewed.  Constitutional:      General: He is not in acute distress.    Comments: Frail appearing  Pulmonary:     Effort: No respiratory distress.  Skin:    General: Skin is warm and dry.     Findings: Wound (right foot) present.  Neurological:     Mental Status: He is lethargic.     Motor: Weakness present.  Psychiatric:        Attention and Perception: Attention normal.        Behavior: Behavior is cooperative.        Cognition and Memory: Cognition and memory normal.      Vital Signs: BP 125/69   Pulse 71   Temp 97.7 F (36.5 C) (Oral)   Resp (!) 22   Ht _0  (1.778 m)   Wt 63.5 kg   SpO2 93%   BMI 20.09 kg/m          SpO2: SpO2: 93 % O2 Device:SpO2:  93 % O2 Flow Rate: .   IO: Intake/output summary: No intake or output data in the 24 hours ending 02/22/20 1328  LBM:   Baseline Weight: Weight: 63.5 kg Most recent weight: Weight: 63.5 kg     Palliative Assessment/Data: PPS 40%     Time In: 1330 Time Out: 1440 Time Total: 70 minutes  Greater than 50%  of this time was spent counseling and coordinating care related to the above assessment and plan.  Signed by: Lin Landsman, NP   Please contact Palliative Medicine Team phone at [phone] for questions and concerns.  For individual provider: See Shea Evans

## 2020-02-22 NOTE — ED Triage Notes (Signed)
Pt here with his daughter, is being treated with doxycycline for wound on his toe. Fever and AMS this morning per daughter with more redness in affected leg than before. Tmax 101. Pt reports feeling weak and tired and pain on his buttocks where he also has several sores.

## 2020-02-23 DIAGNOSIS — E11621 Type 2 diabetes mellitus with foot ulcer: Secondary | ICD-10-CM | POA: Diagnosis not present

## 2020-02-23 DIAGNOSIS — C61 Malignant neoplasm of prostate: Secondary | ICD-10-CM | POA: Diagnosis not present

## 2020-02-23 DIAGNOSIS — Z66 Do not resuscitate: Secondary | ICD-10-CM | POA: Insufficient documentation

## 2020-02-23 DIAGNOSIS — C8581 Other specified types of non-Hodgkin lymphoma, lymph nodes of head, face, and neck: Secondary | ICD-10-CM | POA: Diagnosis not present

## 2020-02-23 DIAGNOSIS — L97514 Non-pressure chronic ulcer of other part of right foot with necrosis of bone: Secondary | ICD-10-CM | POA: Diagnosis not present

## 2020-02-23 DIAGNOSIS — E1151 Type 2 diabetes mellitus with diabetic peripheral angiopathy without gangrene: Secondary | ICD-10-CM | POA: Diagnosis not present

## 2020-02-23 DIAGNOSIS — Z789 Other specified health status: Secondary | ICD-10-CM | POA: Insufficient documentation

## 2020-02-23 DIAGNOSIS — Z7189 Other specified counseling: Secondary | ICD-10-CM | POA: Insufficient documentation

## 2020-02-23 DIAGNOSIS — Z515 Encounter for palliative care: Secondary | ICD-10-CM | POA: Insufficient documentation

## 2020-02-23 DIAGNOSIS — E1142 Type 2 diabetes mellitus with diabetic polyneuropathy: Secondary | ICD-10-CM | POA: Diagnosis not present

## 2020-02-23 LAB — BLOOD CULTURE ID PANEL (REFLEXED) - BCID2

## 2020-02-25 LAB — CULTURE, BLOOD (ROUTINE X 2)
Special Requests: ADEQUATE
Special Requests: ADEQUATE

## 2020-02-26 ENCOUNTER — Inpatient Hospital Stay (HOSPITAL_COMMUNITY): Payer: Medicare Other

## 2020-02-26 ENCOUNTER — Ambulatory Visit (HOSPITAL_COMMUNITY): Payer: Medicare Other

## 2020-02-26 ENCOUNTER — Telehealth: Payer: Self-pay | Admitting: *Deleted

## 2020-02-26 NOTE — Telephone Encounter (Incomplete)
Post ED Visit - Positive Culture Follow-up: Successful Patient Follow-Up  Culture assessed and recommendations reviewed by:  []  Elenor Quinones, Pharm.D. []  Heide Guile, Pharm.D., BCPS AQ-ID []  Parks Neptune, Pharm.D., BCPS []  Alycia Rossetti, Pharm.D., BCPS []  [place], Pharm.D., BCPS, AAHIVP []  Legrand Como, Pharm.D., BCPS, AAHIVP []  Salome Arnt, PharmD, BCPS []  Johnnette Gourd, PharmD, BCPS []  Hughes Better, PharmD, BCPS []  Leeroy Cha, PharmD  Positive *** culture  []  Patient discharged without antimicrobial prescription and treatment is now indicated []  Organism is resistant to prescribed ED discharge antimicrobial [x]  Patient with positive blood cultures  Changes discussed with ED provider Sherol Dade, PA-C   Contacted daughter of patient and informed of positive blood cultures.  Recommend return to ED for further treatment.   Harlon Flor [facility] 02/26/2020, 10:27 AM

## 2020-02-26 NOTE — Progress Notes (Signed)
ED Antimicrobial Stewardship Positive Culture Follow Up   [name] is an 86 y.o. male who presented to [facility] on 02/22/2020 with a chief complaint of R-toe pain, fever Chief Complaint  Patient presents with  . Wound Infection    Recent Results (from the past 720 hour(s))  Urine culture     Status: Abnormal   Collection Time: 02/03/20  5:41 PM   Specimen: Urine, Clean Catch  Result Value Ref Range Status   Specimen Description   Final    URINE, CLEAN CATCH Performed at [facility], [address], [place], [place] [address]    Special Requests   Final    Normal Performed at [facility], [address], [place], [place] [address]    Culture (A)  Final    50,000 COLONIES/mL MULTIPLE SPECIES PRESENT, SUGGEST RECOLLECTION   Report Status 02/04/2020 FINAL  Final  Blood culture (routine x 2)     Status: Abnormal   Collection Time: 02/22/20 10:19 AM   Specimen: BLOOD  Result Value Ref Range Status   Specimen Description BLOOD SITE NOT SPECIFIED  Final   Special Requests   Final    BOTTLES DRAWN AEROBIC AND ANAEROBIC Blood Culture adequate volume   Culture  Setup Time   Final    GRAM POSITIVE COCCI IN CLUSTERS IN BOTH AEROBIC AND ANAEROBIC BOTTLES CRITICAL RESULT CALLED TO, READ BACK BY AND VERIFIED WITH: RN SAM C. [id number] FCP    Culture (A)  Final    STAPHYLOCOCCUS HOMINIS SUSCEPTIBILITIES PERFORMED ON PREVIOUS CULTURE WITHIN THE LAST 5 DAYS. STAPHYLOCOCCUS EPIDERMIDIS THE SIGNIFICANCE OF ISOLATING THIS ORGANISM FROM A SINGLE SET OF BLOOD CULTURES WHEN MULTIPLE SETS ARE DRAWN IS UNCERTAIN. PLEASE NOTIFY THE MICROBIOLOGY DEPARTMENT WITHIN ONE WEEK IF SPECIATION AND SENSITIVITIES ARE REQUIRED. Performed at [facility], [place] [address] [place], [place] [address]    Report Status 02/25/2020 FINAL  Final  Blood Culture ID Panel (Reflexed)     Status: Abnormal   Collection Time: 02/22/20 10:19 AM  Result Value Ref Range Status   Enterococcus faecalis NOT DETECTED  NOT DETECTED Final   Enterococcus Faecium NOT DETECTED NOT DETECTED Final   Listeria monocytogenes NOT DETECTED NOT DETECTED Final   Staphylococcus species DETECTED (A) NOT DETECTED Final    Comment: CRITICAL RESULT CALLED TO, READ BACK BY AND VERIFIED WITH: RN SAM C. [id number] FCP    Staphylococcus aureus (BCID) NOT DETECTED NOT DETECTED Final   Staphylococcus epidermidis NOT DETECTED NOT DETECTED Final   Staphylococcus lugdunensis NOT DETECTED NOT DETECTED Final   Streptococcus species NOT DETECTED NOT DETECTED Final   Streptococcus agalactiae NOT DETECTED NOT DETECTED Final   Streptococcus pneumoniae NOT DETECTED NOT DETECTED Final   Streptococcus pyogenes NOT DETECTED NOT DETECTED Final   A.calcoaceticus-baumannii NOT DETECTED NOT DETECTED Final   Bacteroides fragilis NOT DETECTED NOT DETECTED Final   Enterobacterales NOT DETECTED NOT DETECTED Final   Enterobacter cloacae complex NOT DETECTED NOT DETECTED Final   Escherichia coli NOT DETECTED NOT DETECTED Final   Klebsiella aerogenes NOT DETECTED NOT DETECTED Final   Klebsiella oxytoca NOT DETECTED NOT DETECTED Final   Klebsiella pneumoniae NOT DETECTED NOT DETECTED Final   Proteus species NOT DETECTED NOT DETECTED Final   Salmonella species NOT DETECTED NOT DETECTED Final   Serratia marcescens NOT DETECTED NOT DETECTED Final   Haemophilus influenzae NOT DETECTED NOT DETECTED Final   Neisseria meningitidis NOT DETECTED NOT DETECTED Final   Pseudomonas aeruginosa NOT DETECTED NOT  DETECTED Final   Stenotrophomonas maltophilia NOT DETECTED NOT DETECTED Final   Candida albicans NOT DETECTED NOT DETECTED Final   Candida auris NOT DETECTED NOT DETECTED Final   Candida glabrata NOT DETECTED NOT DETECTED Final   Candida krusei NOT DETECTED NOT DETECTED Final   Candida parapsilosis NOT DETECTED NOT DETECTED Final   Candida tropicalis NOT DETECTED NOT DETECTED Final   Cryptococcus neoformans/gattii NOT DETECTED NOT DETECTED Final     Comment: Performed at [facility], [place] [address] [place], [place] [address]  Respiratory Panel by RT PCR (Flu A&B, Covid) - Nasopharyngeal Swab     Status: None   Collection Time: 02/22/20 10:22 AM   Specimen: Nasopharyngeal Swab  Result Value Ref Range Status   SARS Coronavirus 2 by RT PCR NEGATIVE NEGATIVE Final    Comment: (NOTE) SARS-CoV-2 target nucleic acids are NOT DETECTED.  The SARS-CoV-2 RNA is generally detectable in upper respiratoy specimens during the acute phase of infection. The lowest concentration of SARS-CoV-2 viral copies this assay can detect is 131 copies/mL. A negative result does not preclude SARS-Cov-2 infection and should not be used as the sole basis for treatment or other patient management decisions. A negative result may occur with  improper specimen collection/handling, submission of specimen other than nasopharyngeal swab, presence of viral mutation(s) within the areas targeted by this assay, and inadequate number of viral copies (<131 copies/mL). A negative result must be combined with clinical observations, patient history, and epidemiological information. The expected result is Negative.  Fact Sheet for Patients:  PinkCheek.be  Fact Sheet for Healthcare Providers:  GravelBags.it  This test is no t yet approved or cleared by the [place] FDA and  has been authorized for detection and/or diagnosis of SARS-CoV-2 by FDA under an Emergency Use Authorization (EUA). This EUA will remain  in effect (meaning this test can be used) for the duration of the COVID-19 declaration under Section 564(b)(1) of the Act, 21 U.S.C. section 360bbb-3(b)(1), unless the authorization is terminated or revoked sooner.     Influenza A by PCR NEGATIVE NEGATIVE Final   Influenza B by PCR NEGATIVE NEGATIVE Final    Comment: (NOTE) The Xpert Xpress SARS-CoV-2/FLU/RSV assay is intended as an aid in  the  diagnosis of influenza from Nasopharyngeal swab specimens and  should not be used as a sole basis for treatment. Nasal washings and  aspirates are unacceptable for Xpert Xpress SARS-CoV-2/FLU/RSV  testing.  Fact Sheet for Patients: PinkCheek.be  Fact Sheet for Healthcare Providers: GravelBags.it  This test is not yet approved or cleared by the [place] FDA and  has been authorized for detection and/or diagnosis of SARS-CoV-2 by  FDA under an Emergency Use Authorization (EUA). This EUA will remain  in effect (meaning this test can be used) for the duration of the  Covid-19 declaration under Section 564(b)(1) of the Act, 21  U.S.C. section 360bbb-3(b)(1), unless the authorization is  terminated or revoked. Performed at [facility], [place] [address] [place], [place] [address]   Blood culture (routine x 2)     Status: Abnormal   Collection Time: 02/22/20 10:50 AM   Specimen: BLOOD  Result Value Ref Range Status   Specimen Description BLOOD SITE NOT SPECIFIED  Final   Special Requests   Final    BOTTLES DRAWN AEROBIC AND ANAEROBIC Blood Culture adequate volume   Culture  Setup Time   Final    GRAM POSITIVE COCCI IN CLUSTERS AEROBIC BOTTLE ONLY CRITICAL VALUE  NOTED.  VALUE IS CONSISTENT WITH PREVIOUSLY REPORTED AND CALLED VALUE. Performed at [facility], [place] [address], [place], [place] [address]    Culture STAPHYLOCOCCUS HOMINIS (A)  Final   Report Status 02/25/2020 FINAL  Final   Organism ID, Bacteria STAPHYLOCOCCUS HOMINIS  Final      Susceptibility   Staphylococcus hominis - MIC*    CIPROFLOXACIN >=8 RESISTANT Resistant     ERYTHROMYCIN >=8 RESISTANT Resistant     GENTAMICIN <=0.5 SENSITIVE Sensitive     OXACILLIN >=4 RESISTANT Resistant     TETRACYCLINE >=16 RESISTANT Resistant     VANCOMYCIN <=0.5 SENSITIVE Sensitive     TRIMETH/SULFA 80 RESISTANT Resistant     CLINDAMYCIN 0.5 SENSITIVE Sensitive      RIFAMPIN <=0.5 SENSITIVE Sensitive     Inducible Clindamycin NEGATIVE Sensitive     * STAPHYLOCOCCUS HOMINIS    Growing Staph hominis 3/4 bottles in setting of worsening R-toe infection.  Seen by [facility] + palliative on 11/4 ED visit, decision for no BKA or aggressive tx with pt being on Hospice, sent home with clindamycin PO Rx.    Plan: Call pt+daughter to confirm [place], if wants full/aggressive tx will need to come back to ED for IV tx/work-up of likely Staph hominis bacteremia  ED Provider: Sherol Dade, PA-C   Bertis Ruddy 02/26/2020, 10:09 AM Clinical Pharmacist Monday - Friday phone -  [phone] Saturday - Sunday phone - [phone]

## 2020-02-27 ENCOUNTER — Other Ambulatory Visit: Payer: Self-pay

## 2020-02-27 ENCOUNTER — Encounter (HOSPITAL_BASED_OUTPATIENT_CLINIC_OR_DEPARTMENT_OTHER): Payer: Medicare Other | Attending: Internal Medicine | Admitting: Internal Medicine

## 2020-02-27 DIAGNOSIS — I4891 Unspecified atrial fibrillation: Secondary | ICD-10-CM | POA: Insufficient documentation

## 2020-02-27 DIAGNOSIS — Z8546 Personal history of malignant neoplasm of prostate: Secondary | ICD-10-CM | POA: Insufficient documentation

## 2020-02-27 DIAGNOSIS — F0390 Unspecified dementia without behavioral disturbance: Secondary | ICD-10-CM | POA: Diagnosis not present

## 2020-02-27 DIAGNOSIS — L97514 Non-pressure chronic ulcer of other part of right foot with necrosis of bone: Secondary | ICD-10-CM | POA: Diagnosis not present

## 2020-02-27 DIAGNOSIS — S40812A Abrasion of left upper arm, initial encounter: Secondary | ICD-10-CM | POA: Diagnosis not present

## 2020-02-27 DIAGNOSIS — W1811XA Fall from or off toilet without subsequent striking against object, initial encounter: Secondary | ICD-10-CM | POA: Insufficient documentation

## 2020-02-27 DIAGNOSIS — E1169 Type 2 diabetes mellitus with other specified complication: Secondary | ICD-10-CM | POA: Insufficient documentation

## 2020-02-27 DIAGNOSIS — E1142 Type 2 diabetes mellitus with diabetic polyneuropathy: Secondary | ICD-10-CM | POA: Diagnosis not present

## 2020-02-27 DIAGNOSIS — Z85830 Personal history of malignant neoplasm of bone: Secondary | ICD-10-CM | POA: Insufficient documentation

## 2020-02-27 DIAGNOSIS — E11621 Type 2 diabetes mellitus with foot ulcer: Secondary | ICD-10-CM | POA: Diagnosis not present

## 2020-02-27 DIAGNOSIS — S40811A Abrasion of right upper arm, initial encounter: Secondary | ICD-10-CM | POA: Insufficient documentation

## 2020-02-27 DIAGNOSIS — E1151 Type 2 diabetes mellitus with diabetic peripheral angiopathy without gangrene: Secondary | ICD-10-CM | POA: Diagnosis not present

## 2020-02-27 DIAGNOSIS — I771 Stricture of artery: Secondary | ICD-10-CM | POA: Insufficient documentation

## 2020-02-27 DIAGNOSIS — L97412 Non-pressure chronic ulcer of right heel and midfoot with fat layer exposed: Secondary | ICD-10-CM | POA: Diagnosis not present

## 2020-02-27 DIAGNOSIS — Z7901 Long term (current) use of anticoagulants: Secondary | ICD-10-CM | POA: Insufficient documentation

## 2020-02-27 DIAGNOSIS — Z8572 Personal history of non-Hodgkin lymphomas: Secondary | ICD-10-CM | POA: Diagnosis not present

## 2020-02-27 DIAGNOSIS — I10 Essential (primary) hypertension: Secondary | ICD-10-CM | POA: Diagnosis not present

## 2020-02-28 NOTE — Progress Notes (Signed)
[name], [name] ([id number]) Visit Report for 02/27/2020 HPI Details Patient Name: Date of Service: [name] 02/27/2020 1:00 PM Medical Record Number: [id number] Patient Account Number: [id number] Date of Birth/Sex: Treating RN: June 11, 1923 (86 y.o. [name] Primary Care Provider: Sherrie Mustache Other Clinician: Referring Provider: Treating Provider/Extender: Deloria Lair in Treatment: 22 History of Present Illness HPI Description: ADMISSION 05/02/2019 This is an 86 year old man accompanied by his daughter. He lives in [place] with his elderly wife. He has home caregivers as well. Apparently on February 28, 2019 they noted blood on his sock and noticed a wound on the tip of his right great toe. He has been cared for by Dr. Posey Pronto who is his podiatrist in [place]. Apparently they have been using Betadine to this area. The patient has a thick mycotic nail on the right as well which is embedded in the wound in some areas. Fortunately the patient has diabetic neuropathy and is not really experiencing any pain otherwise I think this would all be quite painful. The patient was previously treated in 2019 for wounds on the left foot. This included a fairly thorough vascular review by vein and vascular Dr. Oneida Alar. His ABIs at that time were noncompressible bilaterally TBI on the right was 0.88 with biphasic waveforms on the right and monophasic waveforms at the left. He ended up having amputations of the left first and second toes. He did have an angiogram on 06/25/2017 which showed on the right the right common femoral profundofemoral for Morris and superficial femoral arteries were widely patent the right popliteal was widely patent. Tibial vessels were not well opacified secondary to motion artifact however he was felt to have all 3 tibial vessels intact with some calcification and some areas of stenosis within the posterior tibial artery  but relatively good flow to the right lower leg. He also had the left reviewed as well which was the source of the problem at that time. Past medical history; prostate cancer on oral antiandrogen therapy with bone mets, GI stromal tumor in 2005, type 2 diabetes with peripheral neuropathy, hypertension, atrial fibrillation on Eliquis, history of non-Hodgkin's lymphoma treated with CHOP in 2005. He had amputations of the left first and second toes secondary to osteomyelitis. Does not appear that he has had an x-ray of the right foot ABI in our clinic on the right was noncompressible [see full arterial discussion above]. 1/19; area on the tip of the right great toe under a very mycotic nail tip. We use silver alginate. 1/26; area of the tip of the right great toe under a very mycotic nail. A lot of this seems to have closed down we have been using silver alginate 2/9; the areas on the tip of the right great toe under a very mycotic nail. A lot of this continues to close down. I do not think there is a subungual problem. I am hopeful that this mycotic nail which is thick and raised will allow full epithelialization of the wound 2/23; this was a difficult area on the tip of his right great toe. Thick mycotic nail over the base of the wound. This appears to have closed down now. The nail itself is thick split with considerable subungual debris. READMISSION 07/25/2019 This is an 86 year old man that we cared for earlier this year in the clinic without a wound on the tip of his right great toe under a very mycotic toenail. He is a type II diabetic. His  ABIs have been noncompressible however he has been felt to have adequate blood flow in the right to heal the wound. We were able to get this wound to close over as I understand things he followed with Dr. Caprice Beaver had some debridement of the nail everything looks fine and apparently the wound bed was washed off by his wife over the weekend and by Monday it was  open. His daughter is able to show me pictures. Unfortunately this now has exposed bone raising the possibility that he has had underlying osteomyelitis all along. He has been using Silvadene cream on this he has been referred back to the clinic by Dr. Caprice Beaver. He had x-rays of the right foot that did not show evidence of osteomyelitis in the right foot. He has not been on antibiotics He also had an x-ray of the left foot that showed findings suspicious for osteomyelitis involving the residual base of the first proximal phalanx and the proximal phalanx of the left second toe as well as the left second metatarsal head however he has no wounds in this area. 4/12; culture from last time of bone in the right great toe was negative. The patient's wife apparently scrubbed this toe vigorously and according to his daughter reopen the wound however unfortunately it is all exposed bone last time. I removed some of this and we use Prisma unfortunately the area does not look much better today. Previous x-rays done in podiatry office Dr. Caprice Beaver did not show evidence of osteomyelitis. Nevertheless I think osteomyelitis here is probably quite likely. Still using Prisma 4/26; he took 2 weeks of doxycycline although his daughter is telling me he is not eating. He has loose bowel movements however I did not get the sense that this is changed all that much. He still has exposed bone. Culture I did last time showed a few coag negative staph I do not think that this is necessarily relevant. I still am operating under the premise that the patient probably has osteomyelitis. After considerable difficulty we are able to determine he is not allergic to cephalosporins I therefore gave him Keflex. 5/10; is tolerating Keflex 500 3 times daily quite well. He still has the small wound in the tip of his toe with exposed bone.Marland Kitchen Unfortunately he does not really have any granulation tissue. He saw Dr. Caprice Beaver of podiatry in  [place] last week I have not had a chance to look at his notes The patient had an extensive vascular work-up including an angiogram in 2019. He was felt to have adequate blood supply on the right at the time although he did have amputations of the left first and second toe by Dr. Oneida Alar. 09/11/19 -Patient is back and is about to finish his Keflex, appointment with Dr. Oneida Alar is in the works. The right great toe wound looks about the same, the left third toe dorsal wound has healed. There was a small eschar covering it that was removed by podiatry.According to patient's daughter the right great toe base on the plantar surface had some redness that seems to have disappeared. 10/13/2019; patient is back in clinic today after 1 month hiatus although I have not seen him since 5/10. Since he was last here he was admitted to hospital from 6/2 through 6/5 because of a nonhealing wound on his right great toe. He underwent an angiogram which showed patent common femoral, profunda, SFA above and below-knee popliteal artery. The dominant runoff in the right lower extremity was through the anterior tibial  but that became very small at the level of the ankle and had minimal outflow into the foot. The peroneal artery was patent but atretic. 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 Interventions were attempted on the posterior tibial. Mechanical orbital atherectomy down to the level of the ankle and then this was angioplastied with 2.3 mm and 3 mm balloons. Flow remained very sluggish down the posterior tibial artery but was patent. Post event there apparently was embolization of an area involving the plantar left heel and sides of the left heel. He arrives back in clinic today it has been almost 7 weeks since I have seen him. Miraculously the great toe area with open bone is closed over  probably as result of Dr. Ainsley Spinner interventions. He does however have an extensive ischemic area on the plantar and medial aspect of his heel. I reviewed Dr. Ainsley Spinner last note from 3 days ago on 6/22. He did not think there was enough inflow to heal the heel wound. With eschar and sloughing. Wondered whether he could heal a below-knee amputation but more likely an above the knee amputation when required. He was placed on Xeroform to the right heel In discussing things with the patient's family and the patient present he is not in a lot of pain. The area does not appear to be grossly infected. The patient appears to be comfortable and conversational. He is eating and drinking fairly well. 7/9; the patient saw Dr. Carlis Abbott on 6/29. He had previously done heroic attempt to establish blood flow as described above. 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 Although the eschar on the heel looks ominous both plantar and medial it is certainly no worse than last week. We will continue Betadine to this area with alginate. Ultimately this will separate off and we will see about the viability of the tissue underneath. Also in clinic today our nurses were washing his right great toe tip apparently some of the skin came off eschared and there is an open wound at the tip of the great toe with exposed bone similar to what he had previously 7/23; he has the open area on the tip of his first toe which I think is mostly bone. Then he has the embolic area on the plantar heel extending into the medial heel. We have been using Betadine and silver alginate to the heel silver alginate to the toe. His daughter brings up that she was given nitroglycerin patches by Dr. Donzetta Matters to try and open blood flow to the foot. She has not been using them out of concern of systemic hypotension and also that the labels said not to  put them on extremities 8/10-Returns at 3 weeks, open area on the tip of his first toe that is dried out, the heel with eschar, applying Betadine to the eschar area and silver alginate dressing. 9/7; 1 month follow-up. He has an open area on the tip of the toe almost subungual with a thick mycotic nail. The heel wound has black eschar but this is beginning to separate. We have been using Betadine and silver alginate on the heel and simply silver alginate on the right great toe 9/23; the patient was worked in urgently at the request of his wife was concerned about infection in the tip of the  toe. Apparently this was brought up by home health. 10/14; the patient has the area of exposed bone on the tip of the right great toe. He also has the eschared area on the plantar heel which was a embolic phenomenon at the time of attempted revascularization. He also had an area of denuded epithelium over both buttocks which I think is probably a combination of pressure friction and moisture. They have been using zinc oxide which is appropriate 10/26. The patient has an area of wider exposed bone at the tip of the right great toe. The substantial eschared area on the heel was already 50% separated I remove this. Actually the patient's wife had called repetitively for urgent appointments with some degree of erythema in the foot as the primary concern. It turns out that she has been to see her primary doctor after the patient had a fall off the commode resulting in skin tears to both forearms. He was placed on doxycycline because of concern of cellulitis of the toe. She is also been to see [facility] in [place] with regards to the toe. Apparently he shaved the bone and recommended Betadine to this. Noteworthy that the patient already had an extensive arterial evaluation with an angiogram. He has known PAD with tibial vessel disease. The last effort was an attempt to open up the posterior tibial  artery which resulted in symptoms of embolization to the heel. 11/9; since the patient was last here he was brought to the ER on 11/4. He was felt to have cellulitis in the right great toe. They did not do a wound culture however they did blood cultures which showed 3 out of 3 Staph hominis. He has been treated with clindamycin 150 mg 3 times times a day. His daughter says he went to the hospital with altered LOC. He had been running a fever but was not febrile on arrival to the ER. Looking over the emergency room notes he had erythema in his foot and I think he had cellulitis. He had apparently been on doxycycline 100 mg twice daily prescribed by Dr. Caprice Beaver prior to this. The staph isolate itself is very resistant it is sensitive to vancomycin but resistant to methicillin's, quinolones, tetracycline and Septra. During the stay in the ER she was seen by vein and vascular they noted that he does not have any further options for revascularization. They do not think that he could undergo any foot conserving surgery therefore the only thing left here would be a BKA The area of the heel that I debrided last time he is here actually looks quite healthy nice healthy looking granulation tissue over perhaps 60 to 70% of the wound still 30% slough we have been using Iodoflex. They have been using silver collagen to the toe The other issue here is the daughter is struggling with the idea of in-home hospice. I went over this with her. I emphasized that hospice is not a home care agency as a velocity of care at the end of life. Patient is 86 years old he apparently is eating and drinking less and since I have known him he has become more frail Electronic Signature(s) Signed: 02/28/2020 9:36:19 AM By: Linton Ham MD Entered By: Linton Ham on 02/27/2020 14:16:35 -------------------------------------------------------------------------------- Physical Exam Details Patient Name: Date of Service: [name] Schwartz E. 02/27/2020 1:00 PM Medical Record Number: [id number] Patient Account Number: [id number] Date of Birth/Sex: Treating RN: 06-03-1923 (86 y.o. [name] Primary Care Provider: Sherrie Mustache  Other Clinician: Referring Provider: Treating Provider/Extender: Deloria Lair in Treatment: 31 Constitutional Sitting or standing Blood Pressure is within target range for patient.. Pulse regular and within target range for patient.Marland Kitchen Respirations regular, non-labored and within target range.. Temperature is normal and within the target range for the patient.Marland Kitchen Appears in no distress. Respiratory work of breathing is normal. Bilateral breath sounds are clear and equal in all lobes with no wheezes, rales or rhonchi.. Cardiovascular 3 out of 6 pansystolic murmur at the lower left sternal border. Pedal pulses are palpable. Psychiatric Patient is awake and conversational. He was able to tell me he watched the brace when the [employer]. Notes Wound exam Dark eschared material over the first toe. I am not sure that this is going to be viable much longer. The area on his heel actually looks quite healthy and at least at this level we probably have some blood flow. Electronic Signature(s) Signed: 02/28/2020 9:36:19 AM By: Linton Ham MD Entered By: Linton Ham on 02/27/2020 14:15:20 -------------------------------------------------------------------------------- Physician Orders Details Patient Name: Date of Service: [name] 02/27/2020 1:00 PM Medical Record Number: [id number] Patient Account Number: [id number] Date of Birth/Sex: Treating RN: [birth year] (86 y.o. [name] Primary Care Provider: Sherrie Mustache Other Clinician: Referring Provider: Treating Provider/Extender: Deloria Lair in Treatment: 29 Verbal / Phone Orders: No Diagnosis Coding ICD-10 Coding Code Description E11.621 Type 2 diabetes  mellitus with foot ulcer L97.514 Non-pressure chronic ulcer of other part of right foot with necrosis of bone E11.40 Type 2 diabetes mellitus with diabetic neuropathy, unspecified L97.412 Non-pressure chronic ulcer of right heel and midfoot with fat layer exposed S40.812D Abrasion of left upper arm, subsequent encounter S40.811D Abrasion of right upper arm, subsequent encounter Follow-up Appointments Return Appointment in 2 weeks. Dressing Change Frequency Change Dressing every other day. - home health to change 3x/week. Wound Cleansing May shower and wash wound with soap and water. - on days that dressing is changed Primary Wound Dressing Wound #2 Right T Great oe Silver Collagen - moisten with hydrogel or KY Jelly. Wound #3 Right Calcaneus Iodoflex - or iodosorb ointment Wound #5 Left Forearm Xeroform Secondary Dressing Wound #2 Right T Great oe Kerlix/Rolled Gauze Dry Gauze Wound #3 Right Calcaneus Kerlix/Rolled Gauze Dry Gauze Heel Cup Wound #5 Left Forearm Dry Gauze - and kerlix or bordered foam. Off-Loading Turn and reposition every 2 hours - no scooting on buttock. for any redness, or excoriation apply zinc oxide barrier cream to buttock to aid the buttock areas. [facility] skilled [facility] for wound care. - [facility] Patient Medications llergies: Augmentin A Notifications Medication Indication Start End 02/27/2020 clindamycin HCl DOSE oral 150 mg capsule - 2 capsule oral =$RemoveBefor'300mg'[id number]$  tid for a further5 days (continuing rx) Electronic Signature(s) Signed: 02/27/2020 2:19:18 PM By: Linton Ham MD Entered By: Linton Ham on 02/27/2020 14:19:15 -------------------------------------------------------------------------------- Problem List Details Patient Name: Date of Service: [name] 02/27/2020 1:00 PM Medical Record Number: [id number] Patient Account Number: [id number] Date of Birth/Sex: Treating RN: 04-06-1924 (86 y.o. [name] Primary Care Provider: Sherrie Mustache Other Clinician: Referring Provider: Treating Provider/Extender: Deloria Lair in Treatment: 31 Active Problems ICD-10 Encounter Code Description Active Date MDM Diagnosis E11.621 Type 2 diabetes mellitus with foot ulcer 07/25/2019 No Yes L97.514 Non-pressure chronic ulcer of other part of right foot with necrosis of bone 07/25/2019 No Yes E11.40 Type 2 diabetes mellitus with diabetic neuropathy, unspecified 07/25/2019  No Yes L97.412 Non-pressure chronic ulcer of right heel and midfoot with fat layer exposed 02/13/2020 No Yes S40.812D Abrasion of left upper arm, subsequent encounter 02/13/2020 No Yes S40.811D Abrasion of right upper arm, subsequent encounter 02/13/2020 No Yes Inactive Problems Resolved Problems Electronic Signature(s) Signed: 02/28/2020 9:36:19 AM By: Linton Ham MD Signed: 02/28/2020 9:36:19 AM By: Linton Ham MD Entered By: Linton Ham on 02/27/2020 14:07:54 -------------------------------------------------------------------------------- Progress Note Details Patient Name: Date of Service: [place], [place] E. 02/27/2020 1:00 PM Medical Record Number: [id number] Patient Account Number: [id number] Date of Birth/Sex: Treating RN: Jul 29, 1923 (86 y.o. [name] Primary Care Provider: Sherrie Mustache Other Clinician: Referring Provider: Treating Provider/Extender: Deloria Lair in Treatment: 31 Subjective History of Present Illness (HPI) ADMISSION 05/02/2019 This is a 86 year old man accompanied by his daughter. He lives in [place] with his elderly wife. He has home caregivers as well. Apparently on February 28, 2019 they noted blood on his sock and noticed a wound on the tip of his right great toe. He has been cared for by Dr. Posey Pronto who is his podiatrist in [place]. Apparently they have been using Betadine to this area. The patient has a  thick mycotic nail on the right as well which is embedded in the wound in some areas. Fortunately the patient has diabetic neuropathy and is not really experiencing any pain otherwise I think this would all be quite painful. The patient was previously treated in 2019 for wounds on the left foot. This included a fairly thorough vascular review by vein and vascular Dr. Oneida Alar. His ABIs at that time were noncompressible bilaterally TBI on the right was 0.88 with biphasic waveforms on the right and monophasic waveforms at the left. He ended up having amputations of the left first and second toes. He did have an angiogram on 06/25/2017 which showed on the right the right common femoral profundofemoral for Morris and superficial femoral arteries were widely patent the right popliteal was widely patent. Tibial vessels were not well opacified secondary to motion artifact however he was felt to have all 3 tibial vessels intact with some calcification and some areas of stenosis within the posterior tibial artery but relatively good flow to the right lower leg. He also had the left reviewed as well which was the source of the problem at that time. Past medical history; prostate cancer on oral antiandrogen therapy with bone mets, GI stromal tumor in 2005, type 2 diabetes with peripheral neuropathy, hypertension, atrial fibrillation on Eliquis, history of non-Hodgkin's lymphoma treated with CHOP in 2005. He had amputations of the left first and second toes secondary to osteomyelitis. Does not appear that he has had an x-ray of the right foot ABI in our clinic on the right was noncompressible [see full arterial discussion above]. 1/19; area on the tip of the right great toe under a very mycotic nail tip. We use silver alginate. 1/26; area of the tip of the right great toe under a very mycotic nail. A lot of this seems to have closed down we have been using silver alginate 2/9; the areas on the tip of the right great  toe under a very mycotic nail. A lot of this continues to close down. I do not think there is a subungual problem. I am hopeful that this mycotic nail which is thick and raised will allow full epithelialization of the wound 2/23; this was a difficult area on the tip of his right great toe. Thick mycotic  nail over the base of the wound. This appears to have closed down now. The nail itself is thick split with considerable subungual debris. READMISSION 07/25/2019 This is a 86 year old man that we cared for earlier this year in the clinic without a wound on the tip of his right great toe under a very mycotic toenail. He is a type II diabetic. His ABIs have been noncompressible however he has been felt to have adequate blood flow in the right to heal the wound. We were able to get this wound to close over as I understand things he followed with Dr. Caprice Beaver had some debridement of the nail everything looks fine and apparently the wound bed was washed off by his wife over the weekend and by Monday it was open. His daughter is able to show me pictures. Unfortunately this now has exposed bone raising the possibility that he has had underlying osteomyelitis all along. He has been using Silvadene cream on this he has been referred back to the clinic by Dr. Caprice Beaver. He had x-rays of the right foot that did not show evidence of osteomyelitis in the right foot. He has not been on antibiotics He also had an x-ray of the left foot that showed findings suspicious for osteomyelitis involving the residual base of the first proximal phalanx and the proximal phalanx of the left second toe as well as the left second metatarsal head however he has no wounds in this area. 4/12; culture from last time of bone in the right great toe was negative. The patient's wife apparently scrubbed this toe vigorously and according to his daughter reopen the wound however unfortunately it is all exposed bone last time. I removed some of  this and we use Prisma unfortunately the area does not look much better today. Previous x-rays done in podiatry office Dr. Caprice Beaver did not show evidence of osteomyelitis. Nevertheless I think osteomyelitis here is probably quite likely. Still using Prisma 4/26; he took 2 weeks of doxycycline although his daughter is telling me he is not eating. He has loose bowel movements however I did not get the sense that this is changed all that much. He still has exposed bone. Culture I did last time showed a few coag negative staph I do not think that this is necessarily relevant. I still am operating under the premise that the patient probably has osteomyelitis. After considerable difficulty we are able to determine he is not allergic to cephalosporins I therefore gave him Keflex. 5/10; is tolerating Keflex 500 3 times daily quite well. He still has the small wound in the tip of his toe with exposed bone.Marland Kitchen Unfortunately he does not really have any granulation tissue. He saw Dr. Caprice Beaver of podiatry in [place] last week I have not had a chance to look at his notes The patient had an extensive vascular work-up including an angiogram in 2019. He was felt to have adequate blood supply on the right at the time although he did have amputations of the left first and second toe by Dr. Oneida Alar. 09/11/19 -Patient is back and is about to finish his Keflex, appointment with Dr. Oneida Alar is in the works. The right great toe wound looks about the same, the left third toe dorsal wound has healed. There was a small eschar covering it that was removed by podiatry.According to patient's daughter the right great toe base on the plantar surface had some redness that seems to have disappeared. 10/13/2019; patient is back in clinic today after 1  month hiatus although I have not seen him since 5/10. Since he was last here he was admitted to hospital from 6/2 through 6/5 because of a nonhealing wound on his right great toe. He  underwent an angiogram which showed patent common femoral, profunda, SFA above and below-knee popliteal artery. The dominant runoff in the right lower extremity was through the anterior tibial but that became very small at the level of the ankle and had minimal outflow into the foot. The peroneal artery was patent but atretic. 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 Interventions were attempted on the posterior tibial. Mechanical orbital atherectomy down to the level of the ankle and then this was angioplastied with 2.3 mm and 3 mm balloons. Flow remained very sluggish down the posterior tibial artery but was patent. Post event there apparently was embolization of an area involving the plantar left heel and sides of the left heel. He arrives back in clinic today it has been almost 7 weeks since I have seen him. Miraculously the great toe area with open bone is closed over probably as result of Dr. Ainsley Spinner interventions. He does however have an extensive ischemic area on the plantar and medial aspect of his heel. I reviewed Dr. Ainsley Spinner last note from 3 days ago on 6/22. He did not think there was enough inflow to heal the heel wound. With eschar and sloughing. Wondered whether he could heal a below-knee amputation but more likely an above the knee amputation when required. He was placed on Xeroform to the right heel In discussing things with the patient's family and the patient present he is not in a lot of pain. The area does not appear to be grossly infected. The patient appears to be comfortable and conversational. He is eating and drinking fairly well. 7/9; the patient saw Dr. Carlis Abbott on 6/29. He had previously done heroic attempt to establish blood flow as described above. 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 Although the eschar on the heel looks ominous both plantar and medial it is certainly no worse than last week. We will continue Betadine to this area with alginate. Ultimately this will separate off and we will see about the viability of the tissue underneath. Also in clinic today our nurses were washing his right great toe tip apparently some of the skin came off eschared and there is an open wound at the tip of the great toe with exposed bone similar to what he had previously 7/23; he has the open area on the tip of his first toe which I think is mostly bone. Then he has the embolic area on the plantar heel extending into the medial heel. We have been using Betadine and silver alginate to the heel silver alginate to the toe. His daughter brings up that she was given nitroglycerin patches by Dr. Donzetta Matters to try and open blood flow to the foot. She has not been using them out of concern of systemic hypotension and also that the labels said not to put them on extremities 8/10-Returns at 3 weeks, open area on the tip of his first toe that is dried out, the heel with eschar, applying Betadine to the eschar area and silver alginate dressing. 9/7; 1 month follow-up. He has an open area on the tip of  the toe almost subungual with a thick mycotic nail. The heel wound has black eschar but this is beginning to separate. We have been using Betadine and silver alginate on the heel and simply silver alginate on the right great toe 9/23; the patient was worked in urgently at the request of his wife was concerned about infection in the tip of the toe. Apparently this was brought up by home health. 10/14; the patient has the area of exposed bone on the tip of the right great toe. He also has the eschared area on the plantar heel which was a embolic phenomenon at the time of attempted revascularization. He also had an area of denuded epithelium over both  buttocks which I think is probably a combination of pressure friction and moisture. They have been using zinc oxide which is appropriate 10/26. The patient has an area of wider exposed bone at the tip of the right great toe. The substantial eschared area on the heel was already 50% separated I remove this. Actually the patient's wife had called repetitively for urgent appointments with some degree of erythema in the foot as the primary concern. It turns out that she has been to see her primary doctor after the patient had a fall off the commode resulting in skin tears to both forearms. He was placed on doxycycline because of concern of cellulitis of the toe. She is also been to see [facility] in [place] with regards to the toe. Apparently he shaved the bone and recommended Betadine to this. Noteworthy that the patient already had an extensive arterial evaluation with an angiogram. He has known PAD with tibial vessel disease. The last effort was an attempt to open up the posterior tibial artery which resulted in symptoms of embolization to the heel. 11/9; since the patient was last here he was brought to the ER on 11/4. He was felt to have cellulitis in the right great toe. They did not do a wound culture however they did blood cultures which showed 3 out of 3 Staph hominis. He has been treated with clindamycin 150 mg 3 times times a day. His daughter says he went to the hospital with altered LOC. He had been running a fever but was not febrile on arrival to the ER. Looking over the emergency room notes he had erythema in his foot and I think he had cellulitis. He had apparently been on doxycycline 100 mg twice daily prescribed by Dr. Caprice Beaver prior to this. The staph isolate itself is very resistant it is sensitive to vancomycin but resistant to methicillin's, quinolones, tetracycline and Septra. During the stay in the ER she was seen by vein and vascular they noted that he does not have  any further options for revascularization. They do not think that he could undergo any foot conserving surgery therefore the only thing left here would be a BKA The area of the heel that I debrided last time he is here actually looks quite healthy nice healthy looking granulation tissue over perhaps 60 to 70% of the wound still 30% slough we have been using Iodoflex. They have been using silver collagen to the toe The other issue here is the daughter is struggling with the idea of in-home hospice. I went over this with her. I emphasized that hospice is not a home care agency as a velocity of care at the end of life. Patient is 86 years old he apparently is eating and drinking less and since I have known him  he has become more frail Objective Constitutional Sitting or standing Blood Pressure is within target range for patient.. Pulse regular and within target range for patient.Marland Kitchen Respirations regular, non-labored and within target range.. Temperature is normal and within the target range for the patient.Marland Kitchen Appears in no distress. Vitals Time Taken: 1:10 PM, Height: 70 in, Weight: 140 lbs, BMI: 20.1, Temperature: 97.9 F, Pulse: 91 bpm, Respiratory Rate: 16 breaths/min, Blood Pressure: 134/78 mmHg, Capillary Blood Glucose: 164 mg/dl. General Notes: glucose per cg report Respiratory work of breathing is normal. Bilateral breath sounds are clear and equal in all lobes with no wheezes, rales or rhonchi.. Cardiovascular 3 out of 6 pansystolic murmur at the lower left sternal border. Pedal pulses are palpable. Psychiatric Patient is awake and conversational. He was able to tell me he watched the brace when the [employer]. General Notes: Wound exam ooDark eschared material over the first toe. I am not sure that this is going to be viable much longer. ooThe area on his heel actually looks quite healthy and at least at this level we probably have some blood flow. Integumentary (Hair, Skin) Wound #2  status is Open. Original cause of wound was Gradually Appeared. The wound is located on the Right T Great. The wound measures 0.5cm length x oe 0.7cm width x 0.3cm depth; 0.275cm^2 area and 0.082cm^3 volume. There is Fat Layer (Subcutaneous Tissue) exposed. There is no tunneling or undermining noted. There is a small amount of serosanguineous drainage noted. The wound margin is indistinct and nonvisible. There is no granulation within the wound bed. There is a large (67-100%) amount of necrotic tissue within the wound bed including Adherent Slough. Wound #3 status is Open. Original cause of wound was Gradually Appeared. The wound is located on the Right Calcaneus. The wound measures 3.6cm length x 5cm width x 0.2cm depth; 14.137cm^2 area and 2.827cm^3 volume. There is Fat Layer (Subcutaneous Tissue) exposed. There is no tunneling or undermining noted. There is a medium amount of serosanguineous drainage noted. The wound margin is flat and intact. There is small (1-33%) pink granulation within the wound bed. There is a large (67-100%) amount of necrotic tissue within the wound bed including Adherent Slough. Wound #4 status is Healed - Epithelialized. Original cause of wound was Trauma. The wound is located on the Right Forearm. The wound measures 0cm length x 0cm width x 0cm depth; 0cm^2 area and 0cm^3 volume. There is no tunneling or undermining noted. There is a none present amount of drainage noted. The wound margin is flat and intact. There is no granulation within the wound bed. There is no necrotic tissue within the wound bed. Wound #5 status is Open. Original cause of wound was Trauma. The wound is located on the Left Forearm. The wound measures 0.3cm length x 0.9cm width x 0.1cm depth; 0.212cm^2 area and 0.021cm^3 volume. There is no tunneling or undermining noted. There is a small amount of serosanguineous drainage noted. The wound margin is flat and intact. There is large (67-100%) red  granulation within the wound bed. There is no necrotic tissue within the wound bed. Assessment Active Problems ICD-10 Type 2 diabetes mellitus with foot ulcer Non-pressure chronic ulcer of other part of right foot with necrosis of bone Type 2 diabetes mellitus with diabetic neuropathy, unspecified Non-pressure chronic ulcer of right heel and midfoot with fat layer exposed Abrasion of left upper arm, subsequent encounter Abrasion of right upper arm, subsequent encounter Plan Follow-up Appointments: Return Appointment in 2 weeks. Dressing  Change Frequency: Change Dressing every other day. - home health to change 3x/week. Wound Cleansing: May shower and wash wound with soap and water. - on days that dressing is changed Primary Wound Dressing: Wound #2 Right T Great: oe Silver Collagen - moisten with hydrogel or [place] Jelly. Wound #3 Right Calcaneus: Iodoflex - or iodosorb ointment Wound #5 Left Forearm: Xeroform Secondary Dressing: Wound #2 Right T Great: oe Kerlix/Rolled Gauze Dry Gauze Wound #3 Right Calcaneus: Kerlix/Rolled Gauze Dry Gauze Heel Cup Wound #5 Left Forearm: Dry Gauze - and kerlix or bordered foam. Off-Loading: Turn and reposition every 2 hours - no scooting on buttock. for any redness, or excoriation apply zinc oxide barrier cream to buttock to aid the buttock areas. Home Health: [facility] for wound care. - [facility] The following medication(s) was prescribed: clindamycin HCl oral 150 mg capsule 2 capsule oral =361m tid for a further5 days (continuing rx) starting 02/27/2020 1. I have extended the clindamycin and increase the dose to 300 mg 3 times daily for a further 5 days. 2. Overall I think this is probably not adequate for coag negative sepsis. If we were not going to use vancomycin here I think linezolid would prove a useful alternative HOWEVER the patient has a meeting with hospice this afternoon. 3. I am not sure that his  daughter is at the level of end-of-life care however. 4. Presumably the right great toe was the source of the coag negative staph given the description of the ED physician with erythema in the forefoot. The heel looks incredibly clean much better than I would have thought. 5. I am going to continue with the silver collagen to the right great toe and Iodoflex to the heel. 6. If they elect to go with hospice care at home we may not be seeing them here. Nor will I be concerned further about the adequacy of the treatment for the coag negative staph. I also note cardiac murmurs I wonder if the year is a another source of the coag negative staph sepsis 7 overall the patient is definitely more frail than when I first met him. I think admission to hospice and giving the patient achance to die in his own home and comfort is the correct decision. Whether the daughter will take this advised I do not know. 8. I have not made a more firm appointment however will remain available to them if they choose to bring him back or by phone Electronic Signature(s) Signed: 02/28/2020 9:36:19 AM By: RLinton HamMD Entered By: RLinton Hamon 02/27/2020 14:22:22 -------------------------------------------------------------------------------- SuperBill Details Patient Name: Date of Service: MManuela SchwartzE. 02/27/2020 Medical Record Number: [id number] Account Number: [id number] of Birth/Sex: Treating RN: [id number](86 y.o. MOval LinseyPrimary Care Provider: ESherrie MustacheOther Clinician: Referring Provider: Treating Provider/Extender: RDeloria Lairin Treatment: 31 Diagnosis Coding ICD-10 Codes Code Description E11.621 Type 2 diabetes mellitus with foot ulcer L97.514 Non-pressure chronic ulcer of other part of right foot with necrosis of bone E11.40 Type 2 diabetes mellitus with diabetic neuropathy, unspecified L97.412 Non-pressure chronic ulcer of right heel  and midfoot with fat layer exposed S40.812D Abrasion of left upper arm, subsequent encounter S40.811D Abrasion of right upper arm, subsequent encounter A41.2 Sepsis due to unspecified staphylococcus Facility Procedures CPT4 Code: [id number]: 99214 - WOUND CARE VISIT-LEV 4 EST PT Modifier: Quantity: 1 Physician Procedures : CPT4 Code Description Modifier [id number]- WC PHYS LEVEL 4 - EST  PT ICD-10 Diagnosis Description L97.412 Non-pressure chronic ulcer of right heel and midfoot with fat layer exposed L97.514 Non-pressure chronic ulcer of other part of right foot with  necrosis of bone E11.621 Type 2 diabetes mellitus with foot ulcer A41.2 Sepsis due to unspecified staphylococcus Quantity: 1 Electronic Signature(s) Signed: 02/28/2020 9:36:19 AM By: Linton Ham MD Entered By: Linton Ham on 02/27/2020 14:23:00

## 2020-02-29 DIAGNOSIS — L97514 Non-pressure chronic ulcer of other part of right foot with necrosis of bone: Secondary | ICD-10-CM | POA: Diagnosis not present

## 2020-02-29 DIAGNOSIS — E11621 Type 2 diabetes mellitus with foot ulcer: Secondary | ICD-10-CM | POA: Diagnosis not present

## 2020-02-29 DIAGNOSIS — C61 Malignant neoplasm of prostate: Secondary | ICD-10-CM | POA: Diagnosis not present

## 2020-02-29 DIAGNOSIS — E1151 Type 2 diabetes mellitus with diabetic peripheral angiopathy without gangrene: Secondary | ICD-10-CM | POA: Diagnosis not present

## 2020-02-29 DIAGNOSIS — C8581 Other specified types of non-Hodgkin lymphoma, lymph nodes of head, face, and neck: Secondary | ICD-10-CM | POA: Diagnosis not present

## 2020-02-29 DIAGNOSIS — E1142 Type 2 diabetes mellitus with diabetic polyneuropathy: Secondary | ICD-10-CM | POA: Diagnosis not present

## 2020-02-29 NOTE — Progress Notes (Signed)
[name], Foster ([id number]) Visit Report for 02/27/2020 Arrival Information Details Patient Name: Date of Service: [name] [name] [place] E. 02/27/2020 1:00 PM Medical Record Number: [id number] Patient Account Number: [id number] Date of Birth/Sex: Treating RN: 20-Jul-1923 (86 y.o. [name] Primary Care [name]: Sherrie Mustache Other Clinician: Referring [name]: Treating [name]/Extender: [name]: 32 Visit Information History Since Last Visit Added or deleted any medications: Yes Patient Arrived: Wheel Chair Any new allergies or adverse reactions: No Arrival Time: 13:09 Had a fall or experienced change in No Accompanied By: daughter and cg activities of daily living that may affect Transfer Assistance: None risk of falls: Patient Identification Verified: Yes Signs or symptoms of abuse/neglect since last visito No Secondary Verification Process Completed: Yes Hospitalized since last visit: No Patient Requires Transmission-Based Precautions: No Implantable device outside of the clinic excluding No Patient Has Alerts: Yes cellular tissue based products placed in the center Patient Alerts: R ABI non compressible since last visit: Has Dressing in Place as Prescribed: Yes Pain Present Now: No Electronic Signature(s) Signed: 02/29/2020 8:28:44 AM By: Levan Hurst RN, BSN Entered By: Levan Hurst on 02/27/2020 13:10:06 -------------------------------------------------------------------------------- Clinic Level of Care Assessment Details Patient Name: Date of Service: [name] [place] E. 02/27/2020 1:00 PM Medical Record Number: [id number] Patient Account Number: [id number] Date of Birth/Sex: Treating RN: [birth year] (86 y.o. [name]) [name] Primary Care Jerlyn Pain: Sherrie Mustache Other Clinician: Referring [name]: Treating [name]: [name]: 31 Clinic Level of Care Assessment  Items TOOL 4 Quantity Score X- 1 0 Use when only an EandM is performed on FOLLOW-UP visit ASSESSMENTS - Nursing Assessment / Reassessment X- 1 10 Reassessment of Co-morbidities (includes updates in patient status) X- 1 5 Reassessment of Adherence to Foster Plan ASSESSMENTS - Wound and Skin A ssessment / Reassessment []  - 0 Simple Wound Assessment / Reassessment - one wound X- 2 5 Complex Wound Assessment / Reassessment - multiple wounds []  - 0 Dermatologic / Skin Assessment (not related to wound area) ASSESSMENTS - Focused Assessment []  - 0 Circumferential Edema Measurements - multi extremities []  - 0 Nutritional Assessment / Counseling / Intervention []  - 0 Lower Extremity Assessment (monofilament, tuning fork, pulses) []  - 0 Peripheral Arterial Disease Assessment (using hand held doppler) ASSESSMENTS - Ostomy and/or Continence Assessment and Care []  - 0 Incontinence Assessment and Management []  - 0 Ostomy Care Assessment and Management (repouching, etc.) PROCESS - Coordination of Care X - Simple Patient / Family Education for ongoing care 1 15 []  - 0 Complex (extensive) Patient / Family Education for ongoing care X- 1 10 Staff obtains Programmer, systems, Records, T Results / Process Orders est []  - 0 Staff telephones HHA, Nursing Homes / Clarify orders / etc []  - 0 Routine Transfer to another Facility (non-emergent condition) []  - 0 Routine Hospital Admission (non-emergent condition) X- 1 15 New Admissions / Biomedical engineer / Ordering NPWT Apligraf, etc. , []  - 0 Emergency Hospital Admission (emergent condition) X- 1 10 Simple Discharge Coordination []  - 0 Complex (extensive) Discharge Coordination PROCESS - Special Needs []  - 0 Pediatric / Minor Patient Management []  - 0 Isolation Patient Management []  - 0 Hearing / Language / Visual special needs []  - 0 Assessment of Community assistance (transportation, D/C planning, etc.) []  - 0 Additional  assistance / Altered mentation []  - 0 Support Surface(s) Assessment (bed, cushion, seat, etc.) INTERVENTIONS - Wound Cleansing / Measurement []  - 0 Simple Wound Cleansing - one wound  X- 2 5 Complex Wound Cleansing - multiple wounds X- 1 5 Wound Imaging (photographs - any number of wounds) []  - 0 Wound Tracing (instead of photographs) []  - 0 Simple Wound Measurement - one wound X- 2 5 Complex Wound Measurement - multiple wounds INTERVENTIONS - Wound Dressings []  - 0 Small Wound Dressing one or multiple wounds []  - 0 Medium Wound Dressing one or multiple wounds X- 1 20 Large Wound Dressing one or multiple wounds X- 1 5 Application of Medications - topical []  - 0 Application of Medications - injection INTERVENTIONS - Miscellaneous []  - 0 External ear exam []  - 0 Specimen Collection (cultures, biopsies, blood, body fluids, etc.) []  - 0 Specimen(s) / Culture(s) sent or taken to Lab for analysis []  - 0 Patient Transfer (multiple staff / Civil Service fast streamer / Similar devices) []  - 0 Simple Staple / Suture removal (25 or less) []  - 0 Complex Staple / Suture removal (26 or more) []  - 0 Hypo / Hyperglycemic Management (close monitor of Blood Glucose) []  - 0 Ankle / Brachial Index (ABI) - do not check if billed separately X- 1 5 Vital Signs Has the patient been seen at the hospital within the last three years: Yes Total Score: 130 Level Of Care: New/Established - Level 4 Electronic Signature(s) Signed: 02/28/2020 5:51:14 PM By: [name] Coria RN Entered By: [name] on 02/27/2020 13:41:55 -------------------------------------------------------------------------------- Encounter Discharge Information Details Patient Name: Date of Service: [name] NK E. 02/27/2020 1:00 PM Medical Record Number: [id number] Patient Account Number: [id number] Date of Birth/Sex: Treating RN: 1923-05-07 (86 y.o. [name] Primary Care Annel Zunker: Sherrie Mustache Other Clinician: Referring  Amrom Ore: Treating [name]/Extender: [name]: 31 Encounter Discharge Information Items Discharge Condition: Stable Ambulatory Status: Wheelchair Discharge Destination: Home Transportation: Private Auto Accompanied By: self Schedule Follow-up Appointment: Yes Clinical Summary of Care: Electronic Signature(s) Signed: 02/27/2020 5:43:21 PM By: Deon Pilling Entered By: Deon Pilling on 02/27/2020 14:18:50 -------------------------------------------------------------------------------- Lower Extremity Assessment Details Patient Name: Date of Service: [name] 02/27/2020 1:00 PM Medical Record Number: [id number] Patient Account Number: [id number] Date of Birth/Sex: Treating RN: 09-24-1923 (86 y.o. [name] Primary Care Raywood Wailes: Sherrie Mustache Other Clinician: Referring Daylyn Azbill: Treating Nyanna Heideman/Extender: [name]: 31 Edema Assessment Assessed: Shirlyn Goltz: No] Patrice Paradise: No] Edema: [Left: N] [Right: o] Calf Left: Right: Point of Measurement: From Medial Instep 27.5 cm Ankle Left: Right: Point of Measurement: From Medial Instep 21 cm Vascular Assessment Pulses: Dorsalis Pedis Palpable: [Right:No] Electronic Signature(s) Signed: 02/29/2020 8:28:44 AM By: Levan Hurst RN, BSN Entered By: Levan Hurst on 02/27/2020 13:15:46 -------------------------------------------------------------------------------- Multi Wound Chart Details Patient Name: Date of Service: [name] 02/27/2020 1:00 PM Medical Record Number: [id number] Patient Account Number: [id number] Date of Birth/Sex: Treating RN: 02/16/1924 (86 y.o. [name] Primary Care Wendee Hata: Sherrie Mustache Other Clinician: Referring Reeder Brisby: Treating Shenouda Genova/Extender: [name]: 31 Vital Signs Height(in): 38 Capillary Blood Glucose(mg/dl): 164 Weight(lbs):  140 Pulse(bpm): 64 Body Mass Index(BMI): 20 Blood Pressure(mmHg): 134/78 Temperature(F): 97.9 Respiratory Rate(breaths/min): 16 Photos: [2:No Photos Right T Great oe] [3:No Photos Right Calcaneus] [4:No Photos Right Forearm] Wound Location: [2:Gradually Appeared] [3:Gradually Appeared] [4:Trauma] Wounding Event: [2:Diabetic Wound/Ulcer of the Lower] [3:Pressure Ulcer] [4:Skin T ear] Primary Etiology: [2:Extremity N/A] [3:Arterial Insufficiency Ulcer] [4:N/A] Secondary Etiology: [2:Cataracts, Arrhythmia, Hypertension, Cataracts, Arrhythmia, Hypertension, Cataracts, Arrhythmia, Hypertension,] Comorbid History: [2:Peripheral Arterial Disease, Type II Peripheral Arterial Disease, Type II Peripheral Arterial  Disease, Type II Diabetes, Osteomyelitis, Dementia, Diabetes, Osteomyelitis, Dementia, Diabetes, Osteomyelitis, Dementia, Neuropathy,  Received Chemotherapy Neuropathy, Received Chemotherapy Neuropathy, Received Chemotherapy 06/15/2019] [3:09/21/2019] [4:02/11/2019] Date Acquired: [2:31] [3:19] [4:2] Weeks of Foster: [2:Open] [3:Open] [4:Healed - Epithelialized] Wound Status: [2:0.5x0.7x0.3] [3:3.6x5x0.2] [4:0x0x0] Measurements L x W x D (cm) [2:0.275] [3:14.137] [4:0] A (cm) : rea [2:0.082] [3:2.827] [4:0] Volume (cm) : [2:-192.60%] [3:67.00%] [4:100.00%] % Reduction in A rea: [2:-192.90%] [3:34.10%] [4:100.00%] % Reduction in Volume: [2:Grade 2] [3:Unstageable/Unclassified] [4:Partial Thickness] Classification: [2:Small] [3:Medium] [4:None Present] Exudate A mount: [2:Serosanguineous] [3:Serosanguineous] [4:N/A] Exudate Type: [2:red, brown] [3:red, brown] [4:N/A] Exudate Color: [2:Indistinct, nonvisible] [3:Flat and Intact] [4:Flat and Intact] Wound Margin: [2:None Present (0%)] [3:Small (1-33%)] [4:None Present (0%)] Granulation A mount: [2:N/A] [3:Pink] [4:N/A] Granulation Quality: [2:Large (67-100%)] [3:Large (67-100%)] [4:None Present (0%)] Necrotic A mount: [2:Fat Layer  (Subcutaneous Tissue): Yes Fat Layer (Subcutaneous Tissue): Yes Fascia: No] Exposed Structures: [2:Fascia: No Tendon: No Muscle: No Joint: No Bone: No None] [3:Fascia: No Tendon: No Muscle: No Joint: No Bone: No None] [4:Fat Layer (Subcutaneous Tissue): No Tendon: No Muscle: No Joint: No Bone: No Large (67-100%)] Wound Number: 5 N/A N/A Photos: No Photos N/A N/A Left Forearm N/A N/A Wound Location: Trauma N/A N/A Wounding Event: Skin Tear N/A N/A Primary Etiology: N/A N/A N/A Secondary Etiology: Cataracts, Arrhythmia, Hypertension, N/A N/A Comorbid History: Peripheral Arterial Disease, Type II Diabetes, Osteomyelitis, Dementia, Neuropathy, Received Chemotherapy 02/11/2019 N/A N/A Date Acquired: 2 N/A N/A Weeks of Foster: Open N/A N/A Wound Status: 0.3x0.9x0.1 N/A N/A Measurements L x W x D (cm) 0.212 N/A N/A A (cm) : rea 0.021 N/A N/A Volume (cm) : 98.90% N/A N/A % Reduction in A rea: 98.90% N/A N/A % Reduction in Volume: Partial Thickness N/A N/A Classification: Small N/A N/A Exudate A mount: Serosanguineous N/A N/A Exudate Type: red, brown N/A N/A Exudate Color: Flat and Intact N/A N/A Wound Margin: Large (67-100%) N/A N/A Granulation A mount: Red N/A N/A Granulation Quality: None Present (0%) N/A N/A Necrotic A mount: Fascia: No N/A N/A Exposed Structures: Fat Layer (Subcutaneous Tissue): No Tendon: No Muscle: No Joint: No Bone: No Large (67-100%) N/A N/A Epithelialization: Foster Notes Electronic Signature(s) Signed: 02/28/2020 9:36:19 AM By: Linton Ham MD Signed: 02/28/2020 5:51:14 PM By: [name] Coria RN Entered By: Linton Ham on 02/27/2020 14:08:06 -------------------------------------------------------------------------------- Multi-Disciplinary Care Plan Details Patient Name: Date of Service: [name] NK E. 02/27/2020 1:00 PM Medical Record Number: [id number] Patient Account Number: [id number] Date of  Birth/Sex: Treating RN: March 23, 1924 (86 y.o. [name] Primary Care Montey Ebel: Sherrie Mustache Other Clinician: Referring Sladen Plancarte: Treating Delita Chiquito/Extender: [name]: 31 Active Inactive Wound/Skin Impairment Nursing Diagnoses: Knowledge deficit related to ulceration/compromised skin integrity Goals: Patient/caregiver will verbalize understanding of skin care regimen Date Initiated: 07/25/2019 Target Resolution Date: 03/22/2020 Goal Status: Active Ulcer/skin breakdown will have a volume reduction of 30% by week 4 Date Initiated: 07/25/2019 Date Inactivated: 08/28/2019 Target Resolution Date: 08/25/2019 Goal Status: Unmet Unmet Reason: Osteomyelitis Interventions: Assess patient/caregiver ability to obtain necessary supplies Assess patient/caregiver ability to perform ulcer/skin care regimen upon admission and as needed Assess ulceration(s) every visit Notes: Electronic Signature(s) Signed: 02/28/2020 5:51:14 PM By: [name] Coria RN Entered By: [name] on 02/27/2020 13:07:00 -------------------------------------------------------------------------------- Pain Assessment Details Patient Name: Date of Service: [name] 02/27/2020 1:00 PM Medical Record Number: [id number] Patient Account Number: [id number] Date of Birth/Sex: Treating RN: [birth year] (86 y.o. [name] Primary Care Salina Stanfield: Sherrie Mustache Other Clinician: Referring Charla Criscione:  Treating Aws Shere/Extender: [name]: 31 Active Problems Location of Pain Severity and Description of Pain Patient Has Paino No Site Locations Pain Management and Medication Current Pain Management: Electronic Signature(s) Signed: 02/29/2020 8:28:44 AM By: Levan Hurst RN, BSN Entered By: Levan Hurst on 02/27/2020 13:11:29 -------------------------------------------------------------------------------- Patient/Caregiver  Education Details Patient Name: Date of Service: MA Doylene Canard 11/9/2021andnbsp1:00 PM Medical Record Number: [id number] Patient Account Number: [id number] Date of Birth/Gender: Treating RN: 1924-04-01 (86 y.o. [name] Primary Care Physician: Sherrie Mustache Other Clinician: Referring Physician: Treating Physician/Extender: [name]: 31 Education Assessment Education Provided To: Patient Education Topics Provided Wound/Skin Impairment: Methods: Explain/Verbal Responses: State content correctly Electronic Signature(s) Signed: 02/28/2020 5:51:14 PM By: [name] Coria RN Entered By: [name] on 02/27/2020 13:07:17 -------------------------------------------------------------------------------- Wound Assessment Details Patient Name: Date of Service: [name] 02/27/2020 1:00 PM Medical Record Number: [id number] Patient Account Number: [id number] Date of Birth/Sex: Treating RN: 08/30/1923 (86 y.o. [name] Primary Care Laqueshia Cihlar: Sherrie Mustache Other Clinician: Referring Jacarie Pate: Treating Haeli Gerlich/Extender: [name]: 31 Wound Status Wound Number: 2 Primary Diabetic Wound/Ulcer of the Lower Extremity Etiology: Wound Location: Right T Great oe Wound Open Wounding Event: Gradually Appeared Status: Date Acquired: 06/15/2019 Comorbid Cataracts, Arrhythmia, Hypertension, Peripheral Arterial Disease, Weeks Of Foster: 31 History: Type II Diabetes, Osteomyelitis, Dementia, Neuropathy, Received Clustered Wound: No Chemotherapy Photos Photo Uploaded By: Mikeal Hawthorne on 02/28/2020 13:34:14 Wound Measurements Length: (cm) 0.5 Width: (cm) 0.7 Depth: (cm) 0.3 Area: (cm) 0.275 Volume: (cm) 0.082 % Reduction in Area: -192.6% % Reduction in Volume: -192.9% Epithelialization: None Tunneling: No Undermining: No Wound Description Classification: Grade  2 Wound Margin: Indistinct, nonvisible Exudate Amount: Small Exudate Type: Serosanguineous Exudate Color: red, brown Foul Odor After Cleansing: No Slough/Fibrino Yes Wound Bed Granulation Amount: None Present (0%) Exposed Structure Necrotic Amount: Large (67-100%) Fascia Exposed: No Necrotic Quality: Adherent Slough Fat Layer (Subcutaneous Tissue) Exposed: Yes Tendon Exposed: No Muscle Exposed: No Joint Exposed: No Bone Exposed: No Foster Notes Wound #2 (Right Toe Great) 1. Cleanse With Wound Cleanser 3. Primary Dressing Applied Collegen AG Hydrogel or K-Y Jelly 4. Secondary Dressing Dry Gauze Roll Gauze 5. Secured With [employer]) Signed: 02/29/2020 8:28:44 AM By: Levan Hurst RN, BSN Entered By: Levan Hurst on 02/27/2020 13:27:58 -------------------------------------------------------------------------------- Wound Assessment Details Patient Name: Date of Service: [name] 02/27/2020 1:00 PM Medical Record Number: [id number] Patient Account Number: [id number] Date of Birth/Sex: Treating RN: 1923-08-08 (86 y.o. [name] Primary Care Viktoriya Glaspy: Sherrie Mustache Other Clinician: Referring Guhan Bruington: Treating Lenis Nettleton/Extender: [name]: 31 Wound Status Wound Number: 3 Primary Pressure Ulcer Etiology: Wound Location: Right Calcaneus Secondary Arterial Insufficiency Ulcer Wounding Event: Gradually Appeared Etiology: Date Acquired: 09/21/2019 Wound Open Weeks Of Foster: 19 Status: Clustered Wound: No Comorbid Cataracts, Arrhythmia, Hypertension, Peripheral Arterial Disease, History: Type II Diabetes, Osteomyelitis, Dementia, Neuropathy, Received Chemotherapy Photos Photo Uploaded By: Mikeal Hawthorne on 02/28/2020 13:34:35 Wound Measurements Length: (cm) 3.6 Width: (cm) 5 Depth: (cm) 0.2 Area: (cm) 14.137 Volume: (cm) 2.827 % Reduction in Area: 67% % Reduction  in Volume: 34.1% Epithelialization: None Tunneling: No Undermining: No Wound Description Classification: Unstageable/Unclassified Wound Margin: Flat and Intact Exudate Amount: Medium Exudate Type: Serosanguineous Exudate Color: red, brown Foul Odor After Cleansing: No Slough/Fibrino Yes Wound Bed Granulation Amount: Small (1-33%) Exposed Structure Granulation Quality: Pink Fascia Exposed: No Necrotic Amount: Large (67-100%) Fat  Layer (Subcutaneous Tissue) Exposed: Yes Necrotic Quality: Adherent Slough Tendon Exposed: No Muscle Exposed: No Joint Exposed: No Bone Exposed: No Foster Notes Wound #3 (Right Calcaneus) 1. Cleanse With Wound Cleanser 3. Primary Dressing Applied Iodoflex 4. Secondary Dressing Dry Gauze Roll Gauze Heel Cup 5. Secured With Medipore tape 7. Footwear/Offloading device applied Wedge shoe Electronic Signature(s) Signed: 02/29/2020 8:28:44 AM By: Levan Hurst RN, BSN Entered By: Levan Hurst on 02/27/2020 13:28:17 -------------------------------------------------------------------------------- Wound Assessment Details Patient Name: Date of Service: [name] 02/27/2020 1:00 PM Medical Record Number: [id number] Patient Account Number: [id number] Date of Birth/Sex: Treating RN: [birth year] (86 y.o. [name] Primary Care Vonda Harth: Sherrie Mustache Other Clinician: Referring Labrittany Wechter: Treating Shanterria Franta/Extender: [name]: 31 Wound Status Wound Number: 4 Primary Skin Tear Etiology: Wound Location: Right Forearm Wound Healed - Epithelialized Wounding Event: Trauma Status: Date Acquired: 02/11/2019 Comorbid Cataracts, Arrhythmia, Hypertension, Peripheral Arterial Disease, Weeks Of Foster: 2 History: Type II Diabetes, Osteomyelitis, Dementia, Neuropathy, Received Clustered Wound: No Chemotherapy Photos Photo Uploaded By: Mikeal Hawthorne on 02/28/2020 13:34:52 Wound  Measurements Length: (cm) Width: (cm) Depth: (cm) Area: (cm) Volume: (cm) 0 % Reduction in Area: 100% 0 % Reduction in Volume: 100% 0 Epithelialization: Large (67-100%) 0 Tunneling: No 0 Undermining: No Wound Description Classification: Partial Thickness Wound Margin: Flat and Intact Exudate Amount: None Present Foul Odor After Cleansing: No Slough/Fibrino No Wound Bed Granulation Amount: None Present (0%) Exposed Structure Necrotic Amount: None Present (0%) Fascia Exposed: No Fat Layer (Subcutaneous Tissue) Exposed: No Tendon Exposed: No Muscle Exposed: No Joint Exposed: No Bone Exposed: No Electronic Signature(s) Signed: 02/29/2020 8:28:44 AM By: Levan Hurst RN, BSN Entered By: Levan Hurst on 02/27/2020 13:28:32 -------------------------------------------------------------------------------- Wound Assessment Details Patient Name: Date of Service: [name] 02/27/2020 1:00 PM Medical Record Number: [id number] Patient Account Number: [id number] Date of Birth/Sex: Treating RN: [birth year] (86 y.o. [name] Primary Care Jerell Demery: Sherrie Mustache Other Clinician: Referring Overton Boggus: Treating Meghan Warshawsky/Extender: [name]: 31 Wound Status Wound Number: 5 Primary Skin Tear Etiology: Wound Location: Left Forearm Wound Open Wounding Event: Trauma Status: Date Acquired: 02/11/2019 Comorbid Cataracts, Arrhythmia, Hypertension, Peripheral Arterial Disease, Weeks Of Foster: 2 History: Type II Diabetes, Osteomyelitis, Dementia, Neuropathy, Received Clustered Wound: No Chemotherapy Photos Photo Uploaded By: Mikeal Hawthorne on 02/28/2020 13:34:36 Wound Measurements Length: (cm) 0.3 Width: (cm) 0.9 Depth: (cm) 0.1 Area: (cm) 0.212 Volume: (cm) 0.021 % Reduction in Area: 98.9% % Reduction in Volume: 98.9% Epithelialization: Large (67-100%) Tunneling: No Undermining: No Wound  Description Classification: Partial Thickness Wound Margin: Flat and Intact Exudate Amount: Small Exudate Type: Serosanguineous Exudate Color: red, brown Foul Odor After Cleansing: No Slough/Fibrino No Wound Bed Granulation Amount: Large (67-100%) Exposed Structure Granulation Quality: Red Fascia Exposed: No Necrotic Amount: None Present (0%) Fat Layer (Subcutaneous Tissue) Exposed: No Tendon Exposed: No Muscle Exposed: No Joint Exposed: No Bone Exposed: No Foster Notes Wound #5 (Left Forearm) 1. Cleanse With Wound Cleanser 3. Primary Dressing Applied Xeroform Gauze 4. Secondary Dressing Dry Gauze Roll Gauze 5. Secured With [employer] tape Notes netting. Electronic Signature(s) Signed: 02/29/2020 8:28:44 AM By: Levan Hurst RN, BSN Entered By: Levan Hurst on 02/27/2020 13:28:42 -------------------------------------------------------------------------------- Vitals Details Patient Name: Date of Service: MA RTIN, [place] E. 02/27/2020 1:00 PM Medical Record Number: [id number] Patient Account Number: [id number] Date of Birth/Sex: Treating RN: 11/21/1923 (86 y.o. [name] Primary Care Sahir Tolson: Sherrie Mustache Other Clinician: Referring Giles Currie: Treating Ion Gonnella/Extender: Linton Ham  Sherrie Mustache Weeks in Foster: 31 Vital Signs Time Taken: 13:10 Temperature (F): 97.9 Height (in): 70 Pulse (bpm): 91 Weight (lbs): 140 Respiratory Rate (breaths/min): 16 Body Mass Index (BMI): 20.1 Blood Pressure (mmHg): 134/78 Capillary Blood Glucose (mg/dl): 164 Reference Range: 80 - 120 mg / dl Notes glucose per cg report Electronic Signature(s) Signed: 02/29/2020 8:28:44 AM By: Levan Hurst RN, BSN Entered By: Levan Hurst on 02/27/2020 13:11:24

## 2020-03-04 ENCOUNTER — Encounter (HOSPITAL_BASED_OUTPATIENT_CLINIC_OR_DEPARTMENT_OTHER): Payer: Medicare Other | Admitting: Internal Medicine

## 2020-03-06 DIAGNOSIS — R1310 Dysphagia, unspecified: Secondary | ICD-10-CM | POA: Diagnosis not present

## 2020-03-06 DIAGNOSIS — E119 Type 2 diabetes mellitus without complications: Secondary | ICD-10-CM | POA: Diagnosis not present

## 2020-03-06 DIAGNOSIS — I10 Essential (primary) hypertension: Secondary | ICD-10-CM | POA: Diagnosis not present

## 2020-03-06 DIAGNOSIS — C8590 Non-Hodgkin lymphoma, unspecified, unspecified site: Secondary | ICD-10-CM | POA: Diagnosis not present

## 2020-03-06 DIAGNOSIS — I4891 Unspecified atrial fibrillation: Secondary | ICD-10-CM | POA: Diagnosis not present

## 2020-03-06 DIAGNOSIS — E46 Unspecified protein-calorie malnutrition: Secondary | ICD-10-CM | POA: Diagnosis not present

## 2020-03-06 DIAGNOSIS — R531 Weakness: Secondary | ICD-10-CM | POA: Diagnosis not present

## 2020-03-06 DIAGNOSIS — C61 Malignant neoplasm of prostate: Secondary | ICD-10-CM | POA: Diagnosis not present

## 2020-03-06 DIAGNOSIS — C49A0 Gastrointestinal stromal tumor, unspecified site: Secondary | ICD-10-CM | POA: Diagnosis not present

## 2020-03-07 ENCOUNTER — Telehealth: Payer: Self-pay

## 2020-03-07 DIAGNOSIS — I10 Essential (primary) hypertension: Secondary | ICD-10-CM | POA: Diagnosis not present

## 2020-03-07 DIAGNOSIS — E119 Type 2 diabetes mellitus without complications: Secondary | ICD-10-CM | POA: Diagnosis not present

## 2020-03-07 DIAGNOSIS — E46 Unspecified protein-calorie malnutrition: Secondary | ICD-10-CM | POA: Diagnosis not present

## 2020-03-07 DIAGNOSIS — C49A0 Gastrointestinal stromal tumor, unspecified site: Secondary | ICD-10-CM | POA: Diagnosis not present

## 2020-03-07 DIAGNOSIS — C8590 Non-Hodgkin lymphoma, unspecified, unspecified site: Secondary | ICD-10-CM | POA: Diagnosis not present

## 2020-03-07 DIAGNOSIS — I4891 Unspecified atrial fibrillation: Secondary | ICD-10-CM | POA: Diagnosis not present

## 2020-03-07 NOTE — Telephone Encounter (Signed)
Called and confirmed

## 2020-03-07 NOTE — Telephone Encounter (Signed)
Yes will e attending

## 2020-03-07 NOTE — Telephone Encounter (Signed)
Casandra from [facility] called and wanted to know if you would be the attending physician for this patient in hospice. I told her that I will send you a message directly and let her know. Message routed to [name], NP. Please Advise.

## 2020-03-11 ENCOUNTER — Other Ambulatory Visit: Payer: Self-pay

## 2020-03-11 ENCOUNTER — Encounter (HOSPITAL_BASED_OUTPATIENT_CLINIC_OR_DEPARTMENT_OTHER): Payer: Medicare Other | Admitting: Physician Assistant

## 2020-03-11 DIAGNOSIS — L97412 Non-pressure chronic ulcer of right heel and midfoot with fat layer exposed: Secondary | ICD-10-CM | POA: Diagnosis not present

## 2020-03-11 DIAGNOSIS — L97512 Non-pressure chronic ulcer of other part of right foot with fat layer exposed: Secondary | ICD-10-CM | POA: Diagnosis not present

## 2020-03-11 DIAGNOSIS — S40812A Abrasion of left upper arm, initial encounter: Secondary | ICD-10-CM | POA: Diagnosis not present

## 2020-03-11 DIAGNOSIS — S40811A Abrasion of right upper arm, initial encounter: Secondary | ICD-10-CM | POA: Diagnosis not present

## 2020-03-11 DIAGNOSIS — E1140 Type 2 diabetes mellitus with diabetic neuropathy, unspecified: Secondary | ICD-10-CM | POA: Diagnosis not present

## 2020-03-11 DIAGNOSIS — E11621 Type 2 diabetes mellitus with foot ulcer: Secondary | ICD-10-CM | POA: Diagnosis not present

## 2020-03-11 DIAGNOSIS — L97514 Non-pressure chronic ulcer of other part of right foot with necrosis of bone: Secondary | ICD-10-CM | POA: Diagnosis not present

## 2020-03-11 DIAGNOSIS — I771 Stricture of artery: Secondary | ICD-10-CM | POA: Diagnosis not present

## 2020-03-11 NOTE — Progress Notes (Addendum)
[name] ([id number]) Visit Report for 03/11/2020 Chief Complaint Document Details Patient Name: Date of Service: [name] 03/11/2020 12:45 PM Medical Record Number: [id number] Patient Account Number: [id number] Date of Birth/Sex: Treating RN: 1923-09-24 (86 y.o. [name] Primary Care Provider: Sherrie Mustache Other Clinician: Referring Provider: Treating Provider/Extender: Jerral Bonito in Treatment: 32 Information Obtained from: Patient Chief Complaint 05/02/2019; patient is here for review of the wound on the tip of the right great toe right at the tip of his toenail 07/25/2019; patient is back for review of the wound on the tip of his right great toe Electronic Signature(s) Signed: 03/11/2020 1:08:00 PM By: [name] Keeler PA-C Entered By: [name] on 03/11/2020 13:08:00 -------------------------------------------------------------------------------- HPI Details Patient Name: Date of Service: [name] E. 03/11/2020 12:45 PM Medical Record Number: [id number] Patient Account Number: [id number] Date of Birth/Sex: Treating RN: [birth year] (86 y.o. [name] Primary Care Provider: Sherrie Mustache Other Clinician: Referring Provider: Treating Provider/Extender: Jerral Bonito in Treatment: 34 History of Present Illness HPI Description: ADMISSION 05/02/2019 This is a 85 year old man accompanied by his daughter. He lives in [place] with his elderly wife. He has home caregivers as well. Apparently on February 28, 2019 they noted blood on his sock and noticed a wound on the tip of his right great toe. He has been cared for by Dr. Posey Pronto who is his podiatrist in [place]. Apparently they have been using Betadine to this area. The patient has a thick mycotic nail on the right as well which is embedded in the wound in some areas. Fortunately the patient has diabetic neuropathy and  is not really experiencing any pain otherwise I think this would all be quite painful. The patient was previously treated in 2019 for wounds on the left foot. This included a fairly thorough vascular review by vein and vascular Dr. Oneida Alar. His ABIs at that time were noncompressible bilaterally TBI on the right was 0.88 with biphasic waveforms on the right and monophasic waveforms at the left. He ended up having amputations of the left first and second toes. He did have an angiogram on 06/25/2017 which showed on the right the right common femoral profundofemoral for Morris and superficial femoral arteries were widely patent the right popliteal was widely patent. Tibial vessels were not well opacified secondary to motion artifact however he was felt to have all 3 tibial vessels intact with some calcification and some areas of stenosis within the posterior tibial artery but relatively good flow to the right lower leg. He also had the left reviewed as well which was the source of the problem at that time. Past medical history; prostate cancer on oral antiandrogen therapy with bone mets, GI stromal tumor in 2005, type 2 diabetes with peripheral neuropathy, hypertension, atrial fibrillation on Eliquis, history of non-Hodgkin's lymphoma treated with CHOP in 2005. He had amputations of the left first and second toes secondary to osteomyelitis. Does not appear that he has had an x-ray of the right foot ABI in our clinic on the right was noncompressible [see full arterial discussion above]. 1/19; area on the tip of the right great toe under a very mycotic nail tip. We use silver alginate. 1/26; area of the tip of the right great toe under a very mycotic nail. A lot of this seems to have closed down we have been using silver alginate 2/9; the areas on the tip of the  right great toe under a very mycotic nail. A lot of this continues to close down. I do not think there is a subungual problem. I am hopeful that this  mycotic nail which is thick and raised will allow full epithelialization of the wound 2/23; this was a difficult area on the tip of his right great toe. Thick mycotic nail over the base of the wound. This appears to have closed down now. The nail itself is thick split with considerable subungual debris. READMISSION 07/25/2019 This is a 86 year old man that we cared for earlier this year in the clinic without a wound on the tip of his right great toe under a very mycotic toenail. He is a type II diabetic. His ABIs have been noncompressible however he has been felt to have adequate blood flow in the right to heal the wound. We were able to get this wound to close over as I understand things he followed with Dr. Caprice Beaver had some debridement of the nail everything looks fine and apparently the wound bed was washed off by his wife over the weekend and by Monday it was open. His daughter is able to show me pictures. Unfortunately this now has exposed bone raising the possibility that he has had underlying osteomyelitis all along. He has been using Silvadene cream on this he has been referred back to the clinic by Dr. Caprice Beaver. He had x-rays of the right foot that did not show evidence of osteomyelitis in the right foot. He has not been on antibiotics He also had an x-ray of the left foot that showed findings suspicious for osteomyelitis involving the residual base of the first proximal phalanx and the proximal phalanx of the left second toe as well as the left second metatarsal head however he has no wounds in this area. 4/12; culture from last time of bone in the right great toe was negative. The patient's wife apparently scrubbed this toe vigorously and according to his daughter reopen the wound however unfortunately it is all exposed bone last time. I removed some of this and we use Prisma unfortunately the area does not look much better today. Previous x-rays done in podiatry office Dr. Caprice Beaver did not  show evidence of osteomyelitis. Nevertheless I think osteomyelitis here is probably quite likely. Still using Prisma 4/26; he took 2 weeks of doxycycline although his daughter is telling me he is not eating. He has loose bowel movements however I did not get the sense that this is changed all that much. He still has exposed bone. Culture I did last time showed a few coag negative staph I do not think that this is necessarily relevant. I still am operating under the premise that the patient probably has osteomyelitis. After considerable difficulty we are able to determine he is not allergic to cephalosporins I therefore gave him Keflex. 5/10; is tolerating Keflex 500 3 times daily quite well. He still has the small wound in the tip of his toe with exposed bone.Marland Kitchen Unfortunately he does not really have any granulation tissue. He saw Dr. Caprice Beaver of podiatry in [place] last week I have not had a chance to look at his notes The patient had an extensive vascular work-up including an angiogram in 2019. He was felt to have adequate blood supply on the right at the time although he did have amputations of the left first and second toe by Dr. Oneida Alar. 09/11/19 -Patient is back and is about to finish his Keflex, appointment with Dr. Oneida Alar is  in the works. The right great toe wound looks about the same, the left third toe dorsal wound has healed. There was a small eschar covering it that was removed by podiatry.According to patient's daughter the right great toe base on the plantar surface had some redness that seems to have disappeared. 10/13/2019; patient is back in clinic today after 1 month hiatus although I have not seen him since 5/10. Since he was last here he was admitted to hospital from 6/2 through 6/5 because of a nonhealing wound on his right great toe. He underwent an angiogram which showed patent common femoral, profunda, SFA above and below-knee popliteal artery. The dominant runoff in the right  lower extremity was through the anterior tibial but that became very small at the level of the ankle and had minimal outflow into the foot. The peroneal artery was patent but atretic. 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 Interventions were attempted on the posterior tibial. Mechanical orbital atherectomy down to the level of the ankle and then this was angioplastied with 2.3 mm and 3 mm balloons. Flow remained very sluggish down the posterior tibial artery but was patent. Post event there apparently was embolization of an area involving the plantar left heel and sides of the left heel. He arrives back in clinic today it has been almost 7 weeks since I have seen him. Miraculously the great toe area with open bone is closed over probably as result of Dr. Ainsley Spinner interventions. He does however have an extensive ischemic area on the plantar and medial aspect of his heel. I reviewed Dr. Ainsley Spinner last note from 3 days ago on 6/22. He did not think there was enough inflow to heal the heel wound. With eschar and sloughing. Wondered whether he could heal a below-knee amputation but more likely an above the knee amputation when required. He was placed on Xeroform to the right heel In discussing things with the patient's family and the patient present he is not in a lot of pain. The area does not appear to be grossly infected. The patient appears to be comfortable and conversational. He is eating and drinking fairly well. 7/9; the patient saw Dr. Carlis Abbott on 6/29. He had previously done heroic attempt to establish blood flow as described above. 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 Although the eschar on the heel looks ominous both plantar and medial it is certainly no worse  than last week. We will continue Betadine to this area with alginate. Ultimately this will separate off and we will see about the viability of the tissue underneath. Also in clinic today our nurses were washing his right great toe tip apparently some of the skin came off eschared and there is an open wound at the tip of the great toe with exposed bone similar to what he had previously 7/23; he has the open area on the tip of his first toe which I think is mostly bone. Then he has the embolic area on the plantar heel extending into the medial heel. We have been using Betadine and silver alginate to the heel silver alginate to the toe. His daughter brings up that she was given nitroglycerin patches by Dr. Donzetta Matters to try and open blood flow to the foot. She has not been using them out  of concern of systemic hypotension and also that the labels said not to put them on extremities 8/10-Returns at 3 weeks, open area on the tip of his first toe that is dried out, the heel with eschar, applying Betadine to the eschar area and silver alginate dressing. 9/7; 1 month follow-up. He has an open area on the tip of the toe almost subungual with a thick mycotic nail. The heel wound has black eschar but this is beginning to separate. We have been using Betadine and silver alginate on the heel and simply silver alginate on the right great toe 9/23; the patient was worked in urgently at the request of his wife was concerned about infection in the tip of the toe. Apparently this was brought up by home health. 10/14; the patient has the area of exposed bone on the tip of the right great toe. He also has the eschared area on the plantar heel which was a embolic phenomenon at the time of attempted revascularization. He also had an area of denuded epithelium over both buttocks which I think is probably a combination of pressure friction and moisture. They have been using zinc oxide which is appropriate 10/26. The patient has  an area of wider exposed bone at the tip of the right great toe. The substantial eschared area on the heel was already 50% separated I remove this. Actually the patient's wife had called repetitively for urgent appointments with some degree of erythema in the foot as the primary concern. It turns out that she has been to see her primary doctor after the patient had a fall off the commode resulting in skin tears to both forearms. He was placed on doxycycline because of concern of cellulitis of the toe. She is also been to see [facility] in [place] with regards to the toe. Apparently he shaved the bone and recommended Betadine to this. Noteworthy that the patient already had an extensive arterial evaluation with an angiogram. He has known PAD with tibial vessel disease. The last effort was an attempt to open up the posterior tibial artery which resulted in symptoms of embolization to the heel. 11/9; since the patient was last here he was brought to the ER on 11/4. He was felt to have cellulitis in the right great toe. They did not do a wound culture however they did blood cultures which showed 3 out of 3 Staph hominis. He has been treated with clindamycin 150 mg 3 times times a day. His daughter says he went to the hospital with altered LOC. He had been running a fever but was not febrile on arrival to the ER. Looking over the emergency room notes he had erythema in his foot and I think he had cellulitis. He had apparently been on doxycycline 100 mg twice daily prescribed by Dr. Caprice Beaver prior to this. The staph isolate itself is very resistant it is sensitive to vancomycin but resistant to methicillin's, quinolones, tetracycline and Septra. During the stay in the ER she was seen by vein and vascular they noted that he does not have any further options for revascularization. They do not think that he could undergo any foot conserving surgery therefore the only thing left here would be a  BKA The area of the heel that I debrided last time he is here actually looks quite healthy nice healthy looking granulation tissue over perhaps 60 to 70% of the wound still 30% slough we have been using Iodoflex. They have been using silver collagen to  the toe The other issue here is the daughter is struggling with the idea of in-home hospice. I went over this with her. I emphasized that hospice is not a home care agency as a velocity of care at the end of life. Patient is 86 years old he apparently is eating and drinking less and since I have known him he has become more frail 03/11/2020 upon evaluation today patient unfortunately is showing signs of progression in regard to his right great toe. Currently he is actually experiencing increased evidence of skin deterioration and in fact this toe is developing into more of a dry gangrene issue at this point. He was signed up for hospice on Thursday and then apparently hospice was called off on Friday due to the fact that they were talking about doing wet-to-dry dressings to both wound locations and his daughter was really concerned about this. She did not want anything to make the wounds progress faster than they should be. With that being said I think a wet-to-dry dressing would be okay for the heel location. I do not believe this would cause any harm. With that being said and in regard to the toe I really do agree I would not really want a wet-to-dry dressing here I think more of like Betadine and dry gauze dressing could be a appropriate way to go after the toe was painted with Betadine that is. Electronic Signature(s) Signed: 03/11/2020 2:21:53 PM By: [name] Keeler PA-C Entered By: [name] on 03/11/2020 14:21:53 -------------------------------------------------------------------------------- Physical Exam Details Patient Name: Date of Service: [name] E. 03/11/2020 12:45 PM Medical Record Number: [id number] Patient Account  Number: [id number] Date of Birth/Sex: Treating RN: 1923-10-22 (86 y.o. [name] Primary Care Provider: Sherrie Mustache Other Clinician: Referring Provider: Treating Provider/Extender: Jerral Bonito in Treatment: 67 Constitutional Well-nourished and well-hydrated in no acute distress. Respiratory normal breathing without difficulty. Psychiatric Patient is not able to cooperate in decision making regarding care. Alert and Oriented x 3. pleasant and cooperative. Notes Upon inspection patient's wound bed actually showed signs of some granulation and some necrotic tissue in regard to the heel. Again I do think a wet-to-dry dressing here could be of benefit. The Iodoflex obviously is helping but again hospice does not utilize that dressing in particular. With that being said I would not really want to see wet-to-dry on the toe I think Betadine painting of the toe followed by dry gauze dressing would be appropriate and a definite way that this could be managed. It does appear that the toe is developing into a dry gangrene situation. Electronic Signature(s) Signed: 03/11/2020 2:22:29 PM By: [name] Keeler PA-C Entered By: [name] on 03/11/2020 14:22:28 -------------------------------------------------------------------------------- Physician Orders Details Patient Name: Date of Service: [name] E. 03/11/2020 12:45 PM Medical Record Number: [id number] Patient Account Number: [id number] Date of Birth/Sex: Treating RN: 08-01-1923 (87 y.o. [name] Primary Care Provider: Sherrie Mustache Other Clinician: Referring Provider: Treating Provider/Extender: Jerral Bonito in Treatment: [phone] Verbal / Phone Orders: No Diagnosis Coding ICD-10 Coding Code Description E11.621 Type 2 diabetes mellitus with foot ulcer L97.514 Non-pressure chronic ulcer of other part of right foot with necrosis of bone E11.40 Type 2 diabetes  mellitus with diabetic neuropathy, unspecified L97.412 Non-pressure chronic ulcer of right heel and midfoot with fat layer exposed S40.812D Abrasion of left upper arm, subsequent encounter S40.811D Abrasion of right upper arm, subsequent encounter Follow-up Appointments Other: -  Call to schedule appointment if needed - Dressing Change Frequency Wound #2 Right T Great oe Change dressing every day. Wound #3 Right Calcaneus Change dressing every day. Wound Cleansing May shower and wash wound with soap and water. Primary Wound Dressing Wound #2 Right T Great oe Other: - Paint with betadine Wound #3 Right Calcaneus Other: - saline moistened gauze Secondary Dressing Wound #2 Right T Great oe Kerlix/Rolled Gauze Dry Gauze Wound #3 Right Calcaneus Kerlix/Rolled Gauze Dry Gauze Heel Cup Off-Loading Turn and reposition every 2 hours - for any redness, or excoriation apply zinc oxide barrier cream to buttock to aid the buttock areas. Electronic Signature(s) Signed: 03/11/2020 5:46:54 PM By: Levan Hurst RN, BSN Signed: 03/13/2020 5:04:05 PM By: [name] Keeler PA-C Entered By: Levan Hurst on 03/11/2020 14:02:06 -------------------------------------------------------------------------------- Problem List Details Patient Name: Date of Service: [name] E. 03/11/2020 12:45 PM Medical Record Number: [id number] Patient Account Number: [id number] Date of Birth/Sex: Treating RN: 1923/10/13 (86 y.o. [name] Primary Care Provider: Sherrie Mustache Other Clinician: Referring Provider: Treating Provider/Extender: Jerral Bonito in Treatment: 32 Active Problems ICD-10 Encounter Code Description Active Date MDM Diagnosis E11.621 Type 2 diabetes mellitus with foot ulcer 07/25/2019 No Yes L97.514 Non-pressure chronic ulcer of other part of right foot with necrosis of bone 07/25/2019 No Yes E11.40 Type 2 diabetes mellitus with diabetic neuropathy,  unspecified 07/25/2019 No Yes L97.412 Non-pressure chronic ulcer of right heel and midfoot with fat layer exposed 02/13/2020 No Yes S40.812D Abrasion of left upper arm, subsequent encounter 02/13/2020 No Yes S40.811D Abrasion of right upper arm, subsequent encounter 02/13/2020 No Yes Inactive Problems Resolved Problems Electronic Signature(s) Signed: 03/11/2020 1:07:54 PM By: [name] Keeler PA-C Entered By: [name] on 03/11/2020 13:07:53 -------------------------------------------------------------------------------- Progress Note Details Patient Name: Date of Service: [name], [name] E. 03/11/2020 12:45 PM Medical Record Number: [id number] Patient Account Number: [id number] Date of Birth/Sex: Treating RN: 1923/10/02 (86 y.o. [name] Primary Care Provider: Sherrie Mustache Other Clinician: Referring Provider: Treating Provider/Extender: Jerral Bonito in Treatment: 45 Subjective Chief Complaint Information obtained from Patient 05/02/2019; patient is here for review of the wound on the tip of the right great toe right at the tip of his toenail 07/25/2019; patient is back for review of the wound on the tip of his right great toe History of Present Illness (HPI) ADMISSION 05/02/2019 This is a 86 year old man accompanied by his daughter. He lives in [place] with his elderly wife. He has home caregivers as well. Apparently on February 28, 2019 they noted blood on his sock and noticed a wound on the tip of his right great toe. He has been cared for by Dr. Posey Pronto who is his podiatrist in [place]. Apparently they have been using Betadine to this area. The patient has a thick mycotic nail on the right as well which is embedded in the wound in some areas. Fortunately the patient has diabetic neuropathy and is not really experiencing any pain otherwise I think this would all be quite painful. The patient was previously treated in 2019 for  wounds on the left foot. This included a fairly thorough vascular review by vein and vascular Dr. Oneida Alar. His ABIs at that time were noncompressible bilaterally TBI on the right was 0.88 with biphasic waveforms on the right and monophasic waveforms at the left. He ended up having amputations of the left first and second toes. He did have an angiogram on  06/25/2017 which showed on the right the right common femoral profundofemoral for Morris and superficial femoral arteries were widely patent the right popliteal was widely patent. Tibial vessels were not well opacified secondary to motion artifact however he was felt to have all 3 tibial vessels intact with some calcification and some areas of stenosis within the posterior tibial artery but relatively good flow to the right lower leg. He also had the left reviewed as well which was the source of the problem at that time. Past medical history; prostate cancer on oral antiandrogen therapy with bone mets, GI stromal tumor in 2005, type 2 diabetes with peripheral neuropathy, hypertension, atrial fibrillation on Eliquis, history of non-Hodgkin's lymphoma treated with CHOP in 2005. He had amputations of the left first and second toes secondary to osteomyelitis. Does not appear that he has had an x-ray of the right foot ABI in our clinic on the right was noncompressible [see full arterial discussion above]. 1/19; area on the tip of the right great toe under a very mycotic nail tip. We use silver alginate. 1/26; area of the tip of the right great toe under a very mycotic nail. A lot of this seems to have closed down we have been using silver alginate 2/9; the areas on the tip of the right great toe under a very mycotic nail. A lot of this continues to close down. I do not think there is a subungual problem. I am hopeful that this mycotic nail which is thick and raised will allow full epithelialization of the wound 2/23; this was a difficult area on the tip of his  right great toe. Thick mycotic nail over the base of the wound. This appears to have closed down now. The nail itself is thick split with considerable subungual debris. READMISSION 07/25/2019 This is a 86 year old man that we cared for earlier this year in the clinic without a wound on the tip of his right great toe under a very mycotic toenail. He is a type II diabetic. His ABIs have been noncompressible however he has been felt to have adequate blood flow in the right to heal the wound. We were able to get this wound to close over as I understand things he followed with Dr. Caprice Beaver had some debridement of the nail everything looks fine and apparently the wound bed was washed off by his wife over the weekend and by Monday it was open. His daughter is able to show me pictures. Unfortunately this now has exposed bone raising the possibility that he has had underlying osteomyelitis all along. He has been using Silvadene cream on this he has been referred back to the clinic by Dr. Caprice Beaver. He had x-rays of the right foot that did not show evidence of osteomyelitis in the right foot. He has not been on antibiotics He also had an x-ray of the left foot that showed findings suspicious for osteomyelitis involving the residual base of the first proximal phalanx and the proximal phalanx of the left second toe as well as the left second metatarsal head however he has no wounds in this area. 4/12; culture from last time of bone in the right great toe was negative. The patient's wife apparently scrubbed this toe vigorously and according to his daughter reopen the wound however unfortunately it is all exposed bone last time. I removed some of this and we use Prisma unfortunately the area does not look much better today. Previous x-rays done in podiatry office Dr. Caprice Beaver did not  show evidence of osteomyelitis. Nevertheless I think osteomyelitis here is probably quite likely. Still using Prisma 4/26; he took 2  weeks of doxycycline although his daughter is telling me he is not eating. He has loose bowel movements however I did not get the sense that this is changed all that much. He still has exposed bone. Culture I did last time showed a few coag negative staph I do not think that this is necessarily relevant. I still am operating under the premise that the patient probably has osteomyelitis. After considerable difficulty we are able to determine he is not allergic to cephalosporins I therefore gave him Keflex. 5/10; is tolerating Keflex 500 3 times daily quite well. He still has the small wound in the tip of his toe with exposed bone.Marland Kitchen Unfortunately he does not really have any granulation tissue. He saw Dr. Caprice Beaver of podiatry in [place] last week I have not had a chance to look at his notes The patient had an extensive vascular work-up including an angiogram in 2019. He was felt to have adequate blood supply on the right at the time although he did have amputations of the left first and second toe by Dr. Oneida Alar. 09/11/19 -Patient is back and is about to finish his Keflex, appointment with Dr. Oneida Alar is in the works. The right great toe wound looks about the same, the left third toe dorsal wound has healed. There was a small eschar covering it that was removed by podiatry.According to patient's daughter the right great toe base on the plantar surface had some redness that seems to have disappeared. 10/13/2019; patient is back in clinic today after 1 month hiatus although I have not seen him since 5/10. Since he was last here he was admitted to hospital from 6/2 through 6/5 because of a nonhealing wound on his right great toe. He underwent an angiogram which showed patent common femoral, profunda, SFA above and below-knee popliteal artery. The dominant runoff in the right lower extremity was through the anterior tibial but that became very small at the level of the ankle and had minimal outflow into the  foot. The peroneal artery was patent but atretic. 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 Interventions were attempted on the posterior tibial. Mechanical orbital atherectomy down to the level of the ankle and then this was angioplastied with 2.3 mm and 3 mm balloons. Flow remained very sluggish down the posterior tibial artery but was patent. Post event there apparently was embolization of an area involving the plantar left heel and sides of the left heel. He arrives back in clinic today it has been almost 7 weeks since I have seen him. Miraculously the great toe area with open bone is closed over probably as result of Dr. Ainsley Spinner interventions. He does however have an extensive ischemic area on the plantar and medial aspect of his heel. I reviewed Dr. Ainsley Spinner last note from 3 days ago on 6/22. He did not think there was enough inflow to heal the heel wound. With eschar and sloughing. Wondered whether he could heal a below-knee amputation but more likely an above the knee amputation when required. He was placed on Xeroform to the right heel In discussing things with the patient's family and the patient present he is not in a lot of pain. The area does not appear to be grossly infected. The patient appears  to be comfortable and conversational. He is eating and drinking fairly well. 7/9; the patient saw Dr. Carlis Abbott on 6/29. He had previously done heroic attempt to establish blood flow as described above. 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 Although the eschar on the heel looks ominous both plantar and medial it is certainly no worse than last week. We will continue Betadine to this area with alginate. Ultimately this will separate off and we will see about the  viability of the tissue underneath. Also in clinic today our nurses were washing his right great toe tip apparently some of the skin came off eschared and there is an open wound at the tip of the great toe with exposed bone similar to what he had previously 7/23; he has the open area on the tip of his first toe which I think is mostly bone. Then he has the embolic area on the plantar heel extending into the medial heel. We have been using Betadine and silver alginate to the heel silver alginate to the toe. His daughter brings up that she was given nitroglycerin patches by Dr. Donzetta Matters to try and open blood flow to the foot. She has not been using them out of concern of systemic hypotension and also that the labels said not to put them on extremities 8/10-Returns at 3 weeks, open area on the tip of his first toe that is dried out, the heel with eschar, applying Betadine to the eschar area and silver alginate dressing. 9/7; 1 month follow-up. He has an open area on the tip of the toe almost subungual with a thick mycotic nail. The heel wound has black eschar but this is beginning to separate. We have been using Betadine and silver alginate on the heel and simply silver alginate on the right great toe 9/23; the patient was worked in urgently at the request of his wife was concerned about infection in the tip of the toe. Apparently this was brought up by home health. 10/14; the patient has the area of exposed bone on the tip of the right great toe. He also has the eschared area on the plantar heel which was a embolic phenomenon at the time of attempted revascularization. He also had an area of denuded epithelium over both buttocks which I think is probably a combination of pressure friction and moisture. They have been using zinc oxide which is appropriate 10/26. The patient has an area of wider exposed bone at the tip of the right great toe. The substantial eschared area on the heel was already 50%  separated I remove this. Actually the patient's wife had called repetitively for urgent appointments with some degree of erythema in the foot as the primary concern. It turns out that she has been to see her primary doctor after the patient had a fall off the commode resulting in skin tears to both forearms. He was placed on doxycycline because of concern of cellulitis of the toe. She is also been to see [facility] in [place] with regards to the toe. Apparently he shaved the bone and recommended Betadine to this. Noteworthy that the patient already had an extensive arterial evaluation with an angiogram. He has known PAD with tibial vessel disease. The last effort was an attempt to open up the posterior tibial artery which resulted in symptoms of embolization to the heel. 11/9; since the patient was last here he was brought to  the ER on 11/4. He was felt to have cellulitis in the right great toe. They did not do a wound culture however they did blood cultures which showed 3 out of 3 Staph hominis. He has been treated with clindamycin 150 mg 3 times times a day. His daughter says he went to the hospital with altered LOC. He had been running a fever but was not febrile on arrival to the ER. Looking over the emergency room notes he had erythema in his foot and I think he had cellulitis. He had apparently been on doxycycline 100 mg twice daily prescribed by Dr. Caprice Beaver prior to this. The staph isolate itself is very resistant it is sensitive to vancomycin but resistant to methicillin's, quinolones, tetracycline and Septra. During the stay in the ER she was seen by vein and vascular they noted that he does not have any further options for revascularization. They do not think that he could undergo any foot conserving surgery therefore the only thing left here would be a BKA The area of the heel that I debrided last time he is here actually looks quite healthy nice healthy looking granulation  tissue over perhaps 60 to 70% of the wound still 30% slough we have been using Iodoflex. They have been using silver collagen to the toe The other issue here is the daughter is struggling with the idea of in-home hospice. I went over this with her. I emphasized that hospice is not a home care agency as a velocity of care at the end of life. Patient is 86 years old he apparently is eating and drinking less and since I have known him he has become more frail 03/11/2020 upon evaluation today patient unfortunately is showing signs of progression in regard to his right great toe. Currently he is actually experiencing increased evidence of skin deterioration and in fact this toe is developing into more of a dry gangrene issue at this point. He was signed up for hospice on Thursday and then apparently hospice was called off on Friday due to the fact that they were talking about doing wet-to-dry dressings to both wound locations and his daughter was really concerned about this. She did not want anything to make the wounds progress faster than they should be. With that being said I think a wet-to-dry dressing would be okay for the heel location. I do not believe this would cause any harm. With that being said and in regard to the toe I really do agree I would not really want a wet-to-dry dressing here I think more of like Betadine and dry gauze dressing could be a appropriate way to go after the toe was painted with Betadine that is. Objective Constitutional Well-nourished and well-hydrated in no acute distress. Vitals Time Taken: 1:07 PM, Height: 70 in, Weight: 140 lbs, BMI: 20.1, Temperature: 97.7 F, Pulse: 69 bpm, Respiratory Rate: 17 breaths/min, Blood Pressure: 120/80 mmHg, Capillary Blood Glucose: 120 mg/dl. Respiratory normal breathing without difficulty. Psychiatric Patient is not able to cooperate in decision making regarding care. Alert and Oriented x 3. pleasant and cooperative. General  Notes: Upon inspection patient's wound bed actually showed signs of some granulation and some necrotic tissue in regard to the heel. Again I do think a wet-to-dry dressing here could be of benefit. The Iodoflex obviously is helping but again hospice does not utilize that dressing in particular. With that being said I would not really want to see wet-to-dry on the toe I think Betadine painting of the  toe followed by dry gauze dressing would be appropriate and a definite way that this could be managed. It does appear that the toe is developing into a dry gangrene situation. Integumentary (Hair, Skin) Wound #2 status is Open. Original cause of wound was Gradually Appeared. The wound is located on the Right T Great. The wound measures 2cm length x oe 7.5cm width x 0.1cm depth; 11.781cm^2 area and 1.178cm^3 volume. There is Fat Layer (Subcutaneous Tissue) exposed. There is no tunneling or undermining noted. There is a none present amount of drainage noted. The wound margin is indistinct and nonvisible. There is no granulation within the wound bed. There is a large (67-100%) amount of necrotic tissue within the wound bed including Eschar. Wound #3 status is Open. Original cause of wound was Gradually Appeared. The wound is located on the Right Calcaneus. The wound measures 4.3cm length x 5cm width x 0.3cm depth; 16.886cm^2 area and 5.066cm^3 volume. There is Fat Layer (Subcutaneous Tissue) exposed. There is no tunneling or undermining noted. There is a medium amount of serosanguineous drainage noted. The wound margin is flat and intact. There is small (1-33%) pink granulation within the wound bed. There is a large (67-100%) amount of necrotic tissue within the wound bed including Adherent Slough. Wound #5 status is Healed - Epithelialized. Original cause of wound was Trauma. The wound is located on the Left Forearm. The wound measures 0cm length x 0cm width x 0cm depth; 0cm^2 area and 0cm^3  volume. Assessment Active Problems ICD-10 Type 2 diabetes mellitus with foot ulcer Non-pressure chronic ulcer of other part of right foot with necrosis of bone Type 2 diabetes mellitus with diabetic neuropathy, unspecified Non-pressure chronic ulcer of right heel and midfoot with fat layer exposed Abrasion of left upper arm, subsequent encounter Abrasion of right upper arm, subsequent encounter Plan Follow-up Appointments: Other: - Call to schedule appointment if needed - Dressing Change Frequency: Wound #2 Right T Great: oe Change dressing every day. Wound #3 Right Calcaneus: Change dressing every day. Wound Cleansing: May shower and wash wound with soap and water. Primary Wound Dressing: Wound #2 Right T Great: oe Other: - Paint with betadine Wound #3 Right Calcaneus: Other: - saline moistened gauze Secondary Dressing: Wound #2 Right T Great: oe Kerlix/Rolled Gauze Dry Gauze Wound #3 Right Calcaneus: Kerlix/Rolled Gauze Dry Gauze Heel Cup Off-Loading: Turn and reposition every 2 hours - for any redness, or excoriation apply zinc oxide barrier cream to buttock to aid the buttock areas. 1. I would recommend currently that we switch over to just a saline moist to dry dressing for the heel I think that this will be a dressing that obviously hospice would be willing to continue I think that is appropriate he does need to be changed daily however. 2. With regard to the toe I would recommend that we actually use Betadine painting with a dry gauze dressing to this location. Obviously this can keep it clean and dry and help to prevent this from worsening any faster than it otherwise would. 3. I did have a conversation with the patient's daughter as well today discussing the obvious concerns I had with regard to whether or not the patient's toe would heal or even the situation of potential auto amputation. I do not think that is likely something that he would be able to wait for in  that regard. And to be honest with his poor blood flow into the lower extremity I think it is unlikely that he would actually  ever heal in regard to an auto amputation or even for that matter surgical amputation according to what vascular reported on their notes both January 15, 2020 and February 22, 2020. They note that he is too frail to consider open operation for revascularization and endovascular management has not been associated. For that reason they suggested the only option would be an above-knee amputation which I think may be too much for the patient to tolerate. I did discuss with the patient's daughter that I feel like his best option would be palliative/hospice care at this point to attempt to give him the best quality of life possible from now until he passes. That could be days, weeks, or months for that matter. Nonetheless I think that these will be good options as far as treatment and dressings are concerned as mentioned above in the interim. We will see the patient back for follow-up visit as needed if anything changes or worsens in the future. Electronic Signature(s) Signed: 03/11/2020 2:22:55 PM By: [name] Keeler PA-C Entered By: [name] on 03/11/2020 14:22:55 -------------------------------------------------------------------------------- SuperBill Details Patient Name: Date of Service: [name] E. 03/11/2020 Medical Record Number: [id number] Patient Account Number: [id number] Date of Birth/Sex: Treating RN: [birth year] (86 y.o. [name] Primary Care Provider: Sherrie Mustache Other Clinician: Referring Provider: Treating Provider/Extender: Jerral Bonito in Treatment: 32 Diagnosis Coding ICD-10 Codes Code Description E11.621 Type 2 diabetes mellitus with foot ulcer L97.514 Non-pressure chronic ulcer of other part of right foot with necrosis of bone E11.40 Type 2 diabetes mellitus with diabetic neuropathy,  unspecified L97.412 Non-pressure chronic ulcer of right heel and midfoot with fat layer exposed S40.812D Abrasion of left upper arm, subsequent encounter S40.811D Abrasion of right upper arm, subsequent encounter Facility Procedures CPT4 Code: [id number] Description: 99214 - WOUND CARE VISIT-LEV 4 EST PT Modifier: Quantity: 1 Physician Procedures : CPT4 Code Description Modifier [id number] - WC PHYS LEVEL 4 - EST PT ICD-10 Diagnosis Description E11.621 Type 2 diabetes mellitus with foot ulcer L97.514 Non-pressure chronic ulcer of other part of right foot with necrosis of bone L97.412  Non-pressure chronic ulcer of right heel and midfoot with fat layer exposed E11.40 Type 2 diabetes mellitus with diabetic neuropathy, unspecified Quantity: 1 Electronic Signature(s) Signed: 03/11/2020 2:23:10 PM By: [name] Keeler PA-C Entered By: [name] on 03/11/2020 14:23:10

## 2020-03-11 NOTE — Progress Notes (Signed)
[name] ([id number]) Visit Report for 03/11/2020 Arrival Information Details Patient Name: Date of Service: [name] 03/11/2020 12:45 PM Medical Record Number: [id number] Patient Account Number: [id number] Date of Birth/Sex: Treating RN: 1923/06/01 (86 y.o. [name] Primary Care Jakaylah Schlafer: Sherrie Mustache Other Clinician: Referring Manley Fason: Treating Yeraldi Fidler/Extender: Jerral Bonito in Treatment: 58 Visit Information History Since Last Visit Added or deleted any medications: No Patient Arrived: Wheel Chair Any new allergies or adverse reactions: No Arrival Time: 13:04 Had a fall or experienced change in No Accompanied By: family activities of daily living that may affect Transfer Assistance: None risk of falls: Patient Identification Verified: Yes Signs or symptoms of abuse/neglect since last visito No Secondary Verification Process Completed: Yes Hospitalized since last visit: No Patient Requires Transmission-Based Precautions: No Implantable device outside of the clinic excluding No Patient Has Alerts: Yes cellular tissue based products placed in the center Patient Alerts: R ABI non compressible since last visit: Has Dressing in Place as Prescribed: Yes Pain Present Now: No Electronic Signature(s) Signed: 03/11/2020 4:33:25 PM By: Deon Pilling Entered By: Deon Pilling on 03/11/2020 13:04:44 -------------------------------------------------------------------------------- Clinic Level of Care Assessment Details Patient Name: Date of Service: [name] 03/11/2020 12:45 PM Medical Record Number: [id number] Patient Account Number: [id number] Date of Birth/Sex: Treating RN: [birth year] (86 y.o. [name] Primary Care Rolf Fells: Sherrie Mustache Other Clinician: Referring Clessie Karras: Treating Chani Ghanem/Extender: Jerral Bonito in Treatment: 20 Clinic Level of Care Assessment Items TOOL 4  Quantity Score X- 1 0 Use when only an EandM is performed on FOLLOW-UP visit ASSESSMENTS - Nursing Assessment / Reassessment X- 1 10 Reassessment of Co-morbidities (includes updates in patient status) X- 1 5 Reassessment of Adherence to Treatment Plan ASSESSMENTS - Wound and Skin A ssessment / Reassessment []  - 0 Simple Wound Assessment / Reassessment - one wound X- 2 5 Complex Wound Assessment / Reassessment - multiple wounds []  - 0 Dermatologic / Skin Assessment (not related to wound area) ASSESSMENTS - Focused Assessment []  - 0 Circumferential Edema Measurements - multi extremities []  - 0 Nutritional Assessment / Counseling / Intervention X- 1 5 Lower Extremity Assessment (monofilament, tuning fork, pulses) []  - 0 Peripheral Arterial Disease Assessment (using hand held doppler) ASSESSMENTS - Ostomy and/or Continence Assessment and Care []  - 0 Incontinence Assessment and Management []  - 0 Ostomy Care Assessment and Management (repouching, etc.) PROCESS - Coordination of Care X - Simple Patient / Family Education for ongoing care 1 15 []  - 0 Complex (extensive) Patient / Family Education for ongoing care X- 1 10 Staff obtains Programmer, systems, Records, T Results / Process Orders est []  - 0 Staff telephones HHA, Nursing Homes / Clarify orders / etc []  - 0 Routine Transfer to another Facility (non-emergent condition) []  - 0 Routine Hospital Admission (non-emergent condition) []  - 0 New Admissions / Biomedical engineer / Ordering NPWT Apligraf, etc. , []  - 0 Emergency Hospital Admission (emergent condition) []  - 0 Simple Discharge Coordination X- 1 15 Complex (extensive) Discharge Coordination PROCESS - Special Needs []  - 0 Pediatric / Minor Patient Management []  - 0 Isolation Patient Management []  - 0 Hearing / Language / Visual special needs []  - 0 Assessment of Community assistance (transportation, D/C planning, etc.) []  - 0 Additional assistance / Altered  mentation []  - 0 Support Surface(s) Assessment (bed, cushion, seat, etc.) INTERVENTIONS - Wound Cleansing / Measurement []  - 0 Simple Wound Cleansing - one wound X- 2  5 Complex Wound Cleansing - multiple wounds []  - 0 Wound Imaging (photographs - any number of wounds) X- 1 5 Wound Tracing (instead of photographs) []  - 0 Simple Wound Measurement - one wound X- 2 5 Complex Wound Measurement - multiple wounds INTERVENTIONS - Wound Dressings []  - 0 Small Wound Dressing one or multiple wounds X- 1 15 Medium Wound Dressing one or multiple wounds []  - 0 Large Wound Dressing one or multiple wounds X- 1 5 Application of Medications - topical []  - 0 Application of Medications - injection INTERVENTIONS - Miscellaneous []  - 0 External ear exam []  - 0 Specimen Collection (cultures, biopsies, blood, body fluids, etc.) []  - 0 Specimen(s) / Culture(s) sent or taken to Lab for analysis []  - 0 Patient Transfer (multiple staff / Civil Service fast streamer / Similar devices) []  - 0 Simple Staple / Suture removal (25 or less) []  - 0 Complex Staple / Suture removal (26 or more) []  - 0 Hypo / Hyperglycemic Management (close monitor of Blood Glucose) []  - 0 Ankle / Brachial Index (ABI) - do not check if billed separately X- 1 5 Vital Signs Has the patient been seen at the hospital within the last three years: Yes Total Score: 120 Level Of Care: New/Established - Level 4 Electronic Signature(s) Signed: 03/11/2020 5:46:54 PM By: Levan Hurst RN, BSN Entered By: Levan Hurst on 03/11/2020 13:58:41 -------------------------------------------------------------------------------- Encounter Discharge Information Details Patient Name: Date of Service: [name] [name] Fuchs E. 03/11/2020 12:45 PM Medical Record Number: [id number] Patient Account Number: [id number] Date of Birth/Sex: Treating RN: 02-14-1924 (86 y.o. [name] Primary Care Graham Hyun: Sherrie Mustache Other Clinician: Referring  Wirt Hemmerich: Treating Ramya Vanbergen/Extender: Jerral Bonito in Treatment: 32 Encounter Discharge Information Items Discharge Condition: Stable Ambulatory Status: Wheelchair Discharge Destination: Home Transportation: Private Auto Accompanied By: Mady Gemma Schedule Follow-up Appointment: Yes Clinical Summary of Care: Patient Declined Electronic Signature(s) Signed: 03/11/2020 5:18:41 PM By: Rhae Hammock RN Entered By: Rhae Hammock on 03/11/2020 14:10:51 -------------------------------------------------------------------------------- Lower Extremity Assessment Details Patient Name: Date of Service: [name] [name] Fuchs E. 03/11/2020 12:45 PM Medical Record Number: [id number] Patient Account Number: [id number] Date of Birth/Sex: Treating RN: [birth year] (86 y.o. [name] Primary Care Janalyn Higby: Sherrie Mustache Other Clinician: Referring Celesta Funderburk: Treating Kron Everton/Extender: Jerral Bonito in Treatment: 32 Edema Assessment Assessed: [Left: No] [Right: No] Edema: [Left: N] [Right: o] Calf Left: Right: Point of Measurement: From Medial Instep 26 cm Ankle Left: Right: Point of Measurement: From Medial Instep 20 cm Vascular Assessment Pulses: Dorsalis Pedis Palpable: [Right:Yes] Posterior Tibial Palpable: [Right:Yes] Electronic Signature(s) Signed: 03/11/2020 4:33:25 PM By: Deon Pilling Entered By: Deon Pilling on 03/11/2020 13:09:34 -------------------------------------------------------------------------------- Multi-Disciplinary Care Plan Details Patient Name: Date of Service: [name] [name] Fuchs E. 03/11/2020 12:45 PM Medical Record Number: [id number] Patient Account Number: [id number] Date of Birth/Sex: Treating RN: December 19, 1923 (86 y.o. [name] Primary Care Elison Worrel: Sherrie Mustache Other Clinician: Referring Patty Lopezgarcia: Treating Iliani Vejar/Extender: Jerral Bonito in Treatment: 32 Active  Inactive Wound/Skin Impairment Nursing Diagnoses: Knowledge deficit related to ulceration/compromised skin integrity Goals: Patient/caregiver will verbalize understanding of skin care regimen Date Initiated: 07/25/2019 Target Resolution Date: 03/22/2020 Goal Status: Active Ulcer/skin breakdown will have a volume reduction of 30% by week 4 Date Initiated: 07/25/2019 Date Inactivated: 08/28/2019 Target Resolution Date: 08/25/2019 Goal Status: Unmet Unmet Reason: Osteomyelitis Interventions: Assess patient/caregiver ability to obtain necessary supplies Assess patient/caregiver ability to perform ulcer/skin care regimen upon admission and as needed Assess ulceration(s) every  visit Notes: Electronic Signature(s) Signed: 03/11/2020 5:46:54 PM By: Levan Hurst RN, BSN Entered By: Levan Hurst on 03/11/2020 13:47:53 -------------------------------------------------------------------------------- Pain Assessment Details Patient Name: Date of Service: [name] E. 03/11/2020 12:45 PM Medical Record Number: [id number] Patient Account Number: [id number] Date of Birth/Sex: Treating RN: [birth year] (86 y.o. [name] Primary Care Aayan Haskew: Sherrie Mustache Other Clinician: Referring Raeann Offner: Treating Tylena Prisk/Extender: Jerral Bonito in Treatment: 32 Active Problems Location of Pain Severity and Description of Pain Patient Has Paino No Site Locations With Dressing Change: No Rate the pain. Current Pain Level: 0 Pain Management and Medication Current Pain Management: Electronic Signature(s) Signed: 03/11/2020 4:33:25 PM By: Deon Pilling Entered By: Deon Pilling on 03/11/2020 13:09:06 -------------------------------------------------------------------------------- Patient/Caregiver Education Details Patient Name: Date of Service: [name] 11/22/2021andnbsp12:45 PM Medical Record Number: [id number] Patient Account Number: [id number] Date  of Birth/Gender: Treating RN: 1923/07/19 (86 y.o. [name] Primary Care Physician: Sherrie Mustache Other Clinician: Referring Physician: Treating Physician/Extender: Jerral Bonito in Treatment: 32 Education Assessment Education Provided To: Patient Education Topics Provided Wound/Skin Impairment: Methods: Explain/Verbal Responses: State content correctly [employer]) Signed: 03/11/2020 5:46:54 PM By: Levan Hurst RN, BSN Entered By: Levan Hurst on 03/11/2020 13:48:05 -------------------------------------------------------------------------------- Wound Assessment Details Patient Name: Date of Service: [name] [name] Fuchs E. 03/11/2020 12:45 PM Medical Record Number: [id number] Patient Account Number: [id number] Date of Birth/Sex: Treating RN: February 08, 1924 (86 y.o. [name] Primary Care Arisbel Maione: Sherrie Mustache Other Clinician: Referring Holly Pring: Treating Stran Raper/Extender: Jerral Bonito in Treatment: 32 Wound Status Wound Number: 2 Primary Diabetic Wound/Ulcer of the Lower Extremity Etiology: Wound Location: Right T Great oe Wound Open Wounding Event: Gradually Appeared Status: Date Acquired: 06/15/2019 Comorbid Cataracts, Arrhythmia, Hypertension, Peripheral Arterial Disease, Weeks Of Treatment: 32 History: Type II Diabetes, Osteomyelitis, Dementia, Neuropathy, Received Clustered Wound: No Chemotherapy Wound Measurements Length: (cm) 2 Width: (cm) 7.5 Depth: (cm) 0.1 Area: (cm) 11.781 Volume: (cm) 1.178 % Reduction in Area: -12433% % Reduction in Volume: -4107.1% Epithelialization: None Tunneling: No Undermining: No Wound Description Classification: Grade 2 Wound Margin: Indistinct, nonvisible Exudate Amount: None Present Foul Odor After Cleansing: No Slough/Fibrino Yes Wound Bed Granulation Amount: None Present (0%) Exposed Structure Necrotic Amount: Large (67-100%) Fascia  Exposed: No Necrotic Quality: Eschar Fat Layer (Subcutaneous Tissue) Exposed: Yes Tendon Exposed: No Muscle Exposed: No Joint Exposed: No Bone Exposed: No Treatment Notes Wound #2 (Right Toe Great) 1. Cleanse With Saline Wound Cleanser 3. Primary Dressing Applied Other primary dressing (specifiy in notes) 4. Secondary Dressing Dry Gauze Roll Gauze 5. Secured With Medipore tape Notes betadine to right great toe, saline mouistened gauze to right heel. Electronic Signature(s) Signed: 03/11/2020 4:33:25 PM By: Deon Pilling Entered By: Deon Pilling on 03/11/2020 13:10:28 -------------------------------------------------------------------------------- Wound Assessment Details Patient Name: Date of Service: [name] 03/11/2020 12:45 PM Medical Record Number: [id number] Patient Account Number: [id number] Date of Birth/Sex: Treating RN: May 03, 1923 (86 y.o. Lorette Ang, Meta.Reding Primary Care Brigett Estell: Sherrie Mustache Other Clinician: Referring Fey Coghill: Treating Givanni Staron/Extender: Jerral Bonito in Treatment: 32 Wound Status Wound Number: 3 Primary Pressure Ulcer Etiology: Wound Location: Right Calcaneus Secondary Arterial Insufficiency Ulcer Wounding Event: Gradually Appeared Etiology: Date Acquired: 09/21/2019 Wound Open Weeks Of Treatment: 21 Status: Clustered Wound: No Comorbid Cataracts, Arrhythmia, Hypertension, Peripheral Arterial Disease, History: Type II Diabetes, Osteomyelitis, Dementia, Neuropathy, Received Chemotherapy Wound Measurements Length: (cm) 4.3 Width: (cm) 5 Depth: (cm) 0.3 Area: (cm) 02.637  Volume: (cm) 5.066 % Reduction in Area: 60.6% % Reduction in Volume: -18.1% Epithelialization: None Tunneling: No Undermining: No Wound Description Classification: Unstageable/Unclassified Wound Margin: Flat and Intact Exudate Amount: Medium Exudate Type: Serosanguineous Exudate Color: red, brown Foul Odor After  Cleansing: No Slough/Fibrino Yes Wound Bed Granulation Amount: Small (1-33%) Exposed Structure Granulation Quality: Pink Fascia Exposed: No Necrotic Amount: Large (67-100%) Fat Layer (Subcutaneous Tissue) Exposed: Yes Necrotic Quality: Adherent Slough Tendon Exposed: No Muscle Exposed: No Joint Exposed: No Bone Exposed: No Treatment Notes Wound #3 (Right Calcaneus) 1. Cleanse With Saline Wound Cleanser 3. Primary Dressing Applied Other primary dressing (specifiy in notes) 4. Secondary Dressing Dry Gauze Roll Gauze 5. Secured With Medipore tape Notes betadine to right great toe, saline mouistened gauze to right heel. Electronic Signature(s) Signed: 03/11/2020 4:33:25 PM By: Deon Pilling Entered By: Deon Pilling on 03/11/2020 13:10:57 -------------------------------------------------------------------------------- Wound Assessment Details Patient Name: Date of Service: [name] 03/11/2020 12:45 PM Medical Record Number: [id number] Patient Account Number: [id number] Date of Birth/Sex: Treating RN: 1923-05-28 (86 y.o. [name] Primary Care Skyah Hannon: Sherrie Mustache Other Clinician: Referring Marcellis Frampton: Treating Kowen Kluth/Extender: Jerral Bonito in Treatment: 32 Wound Status Wound Number: 5 Primary Etiology: Skin Tear Wound Location: Left Forearm Wound Status: Healed - Epithelialized Wounding Event: Trauma Date Acquired: 02/11/2019 Weeks Of Treatment: 3 Clustered Wound: No Wound Measurements Length: (cm) 0 Width: (cm) 0 Depth: (cm) 0 Area: (cm) 0 Volume: (cm) 0 % Reduction in Area: 100% % Reduction in Volume: 100% Wound Description Classification: Partial Thickness Electronic Signature(s) Signed: 03/11/2020 4:33:25 PM By: Deon Pilling Entered By: Deon Pilling on 03/11/2020 13:11:22 -------------------------------------------------------------------------------- Vitals Details Patient Name: Date of Service: [place] E. 03/11/2020 12:45 PM Medical Record Number: [id number] Patient Account Number: [id number] Date of Birth/Sex: Treating RN: Feb 03, 1924 (86 y.o. [name] Primary Care Clinton Wahlberg: Sherrie Mustache Other Clinician: Referring Saahas Hidrogo: Treating Draven Laine/Extender: Jerral Bonito in Treatment: 32 Vital Signs Time Taken: 13:07 Temperature (F): 97.7 Height (in): 70 Pulse (bpm): 69 Weight (lbs): 140 Respiratory Rate (breaths/min): 17 Body Mass Index (BMI): 20.1 Blood Pressure (mmHg): 120/80 Capillary Blood Glucose (mg/dl): 120 Reference Range: 80 - 120 mg / dl Electronic Signature(s) Signed: 03/11/2020 4:33:25 PM By: Deon Pilling Entered By: Deon Pilling on 03/11/2020 13:08:09

## 2020-03-19 ENCOUNTER — Telehealth: Payer: Self-pay

## 2020-03-19 NOTE — Telephone Encounter (Signed)
Incoming call received from [place] with [facility] asking if [name], NP will be the attending for referral placed by the [facility].   Per verbal conversation with [name], yes she will be the attending.  I called Geri back to relay the message

## 2020-03-21 ENCOUNTER — Telehealth: Payer: Self-pay | Admitting: Cardiology

## 2020-03-21 NOTE — Telephone Encounter (Signed)
New message    Patient daughter would like a call back about getting the financial assistance done for mr & mrs Kne[facility] - Eliquis

## 2020-03-21 NOTE — Telephone Encounter (Signed)
Spoke with daughter and notified that patient assistance are ready to be picked up and completed. Daughter states she will come by next week.

## 2020-03-25 ENCOUNTER — Inpatient Hospital Stay (HOSPITAL_COMMUNITY): Payer: Medicare Other | Attending: Hematology and Oncology

## 2020-03-25 ENCOUNTER — Encounter (HOSPITAL_COMMUNITY): Payer: Self-pay

## 2020-03-25 ENCOUNTER — Other Ambulatory Visit: Payer: Self-pay

## 2020-03-25 ENCOUNTER — Inpatient Hospital Stay (HOSPITAL_COMMUNITY): Payer: Medicare Other

## 2020-03-25 ENCOUNTER — Other Ambulatory Visit (HOSPITAL_COMMUNITY): Payer: Self-pay

## 2020-03-25 VITALS — BP 130/84 | HR 67 | Temp 97.2°F | Resp 17

## 2020-03-25 DIAGNOSIS — C7951 Secondary malignant neoplasm of bone: Secondary | ICD-10-CM | POA: Diagnosis not present

## 2020-03-25 DIAGNOSIS — C61 Malignant neoplasm of prostate: Secondary | ICD-10-CM

## 2020-03-25 LAB — COMPREHENSIVE METABOLIC PANEL
ALT: 8 U/L (ref 0–44)
AST: 12 U/L — ABNORMAL LOW (ref 15–41)
Albumin: 2.9 g/dL — ABNORMAL LOW (ref 3.5–5.0)
Alkaline Phosphatase: 75 U/L (ref 38–126)
Anion gap: 8 (ref 5–15)
BUN: 19 mg/dL (ref 8–23)
CO2: 26 mmol/L (ref 22–32)
Calcium: 9.2 mg/dL (ref 8.9–10.3)
Chloride: 102 mmol/L (ref 98–111)
Creatinine, Ser: 1.01 mg/dL (ref 0.61–1.24)
GFR, Estimated: >60 mL/min (ref >60)
Glucose, Bld: 167 mg/dL — ABNORMAL HIGH (ref 70–99)
Potassium: 4.4 mmol/L (ref 3.5–5.1)
Sodium: 136 mmol/L (ref 135–145)
Total Bilirubin: 0.3 mg/dL (ref 0.3–1.2)
Total Protein: 6.2 g/dL — ABNORMAL LOW (ref 6.5–8.1)

## 2020-03-25 MED ORDER — DENOSUMAB 120 MG/1.7ML ~~LOC~~ SOLN
SUBCUTANEOUS | Status: AC
  Filled 2020-03-25: qty 1.7

## 2020-03-25 MED ORDER — DENOSUMAB 120 MG/1.7ML ~~LOC~~ SOLN
120.0000 mg | Freq: Once | SUBCUTANEOUS | Status: AC
  Administered 2020-03-25: 120 mg via SUBCUTANEOUS

## 2020-03-25 MED ORDER — LEUPROLIDE ACETATE (6 MONTH) 45 MG ~~LOC~~ KIT
45.0000 mg | PACK | Freq: Once | SUBCUTANEOUS | Status: AC
  Administered 2020-03-25: 45 mg via SUBCUTANEOUS
  Filled 2020-03-25: qty 45

## 2020-03-25 NOTE — Patient Instructions (Addendum)
[place] at [facility] Discharge Instructions  Received Eligard and Delton See injections today. Follow-up as scheduled   Thank you for choosing [place] at [facility] to provide your oncology and hematology care.  To afford each patient quality time with our provider, please arrive at least 15 minutes before your scheduled appointment time.   If you have a lab appointment with the [facility] please come in thru the Main Entrance and check in at the main information desk.  You need to re-schedule your appointment should you arrive 10 or more minutes late.  We strive to give you quality time with our providers, and arriving late affects you and other patients whose appointments are after yours.  Also, if you no show three or more times for appointments you may be dismissed from the clinic at the providers discretion.     Again, thank you for choosing [facility].  Our hope is that these requests will decrease the amount of time that you wait before being seen by our physicians.       _____________________________________________________________  Should you have questions after your visit to [facility], please contact our office at [phone] and follow the prompts.  Our office hours are 8:00 a.m. and 4:30 p.m. Monday - Friday.  Please note that voicemails left after 4:00 p.m. may not be returned until the following business day.  We are closed weekends and major holidays.  You do have access to a nurse 24-7, just call the main number to the clinic [phone] and do not press any options, hold on the line and a nurse will answer the phone.    For prescription refill requests, have your pharmacy contact our office and allow 72 hours.    Due to Covid, you will need to wear a mask upon entering the hospital. If you do not have a mask, a mask will be given to you at the Main Entrance upon arrival. For doctor visits,  patients may have 1 support person age 41 or older with them. For treatment visits, patients can not have anyone with them due to social distancing guidelines and our immunocompromised population.

## 2020-03-25 NOTE — Progress Notes (Signed)
[name] tolerated Eligard and Xgeva injections well without complaints or incident. Calcium 9.2 today and pt denied any tooth or jaw pain and no recent or future dental visits prior to administering this medication. VSS Pt discharged via wheelchair in satisfactory condition

## 2020-03-26 ENCOUNTER — Ambulatory Visit: Payer: Medicare Other | Admitting: Urology

## 2020-03-26 ENCOUNTER — Telehealth: Payer: Self-pay | Admitting: *Deleted

## 2020-03-26 NOTE — Telephone Encounter (Signed)
[name] with [facility] called and stated that patient has been with them since [name] in their Palliative Program.  Stated that patient cannot have [facility] also. Stated that the [place] placed a referral and [name] agreed to be attending on 03/19/2020  I instructed her that she [name] need to call the [place] and [facility] and let them know that patient's services are through [facility].   She agreed and will call.

## 2020-03-28 ENCOUNTER — Other Ambulatory Visit: Payer: Self-pay

## 2020-03-28 ENCOUNTER — Encounter (HOSPITAL_BASED_OUTPATIENT_CLINIC_OR_DEPARTMENT_OTHER): Payer: Medicare Other | Attending: Internal Medicine | Admitting: Internal Medicine

## 2020-03-28 ENCOUNTER — Telehealth: Payer: Self-pay | Admitting: *Deleted

## 2020-03-28 DIAGNOSIS — L97514 Non-pressure chronic ulcer of other part of right foot with necrosis of bone: Secondary | ICD-10-CM | POA: Insufficient documentation

## 2020-03-28 DIAGNOSIS — F0390 Unspecified dementia without behavioral disturbance: Secondary | ICD-10-CM | POA: Diagnosis not present

## 2020-03-28 DIAGNOSIS — E11621 Type 2 diabetes mellitus with foot ulcer: Secondary | ICD-10-CM | POA: Insufficient documentation

## 2020-03-28 DIAGNOSIS — L97412 Non-pressure chronic ulcer of right heel and midfoot with fat layer exposed: Secondary | ICD-10-CM | POA: Diagnosis not present

## 2020-03-28 DIAGNOSIS — L89610 Pressure ulcer of right heel, unstageable: Secondary | ICD-10-CM | POA: Diagnosis not present

## 2020-03-28 DIAGNOSIS — E1142 Type 2 diabetes mellitus with diabetic polyneuropathy: Secondary | ICD-10-CM | POA: Diagnosis not present

## 2020-03-28 DIAGNOSIS — I10 Essential (primary) hypertension: Secondary | ICD-10-CM | POA: Insufficient documentation

## 2020-03-28 DIAGNOSIS — I4891 Unspecified atrial fibrillation: Secondary | ICD-10-CM | POA: Insufficient documentation

## 2020-03-28 DIAGNOSIS — E1151 Type 2 diabetes mellitus with diabetic peripheral angiopathy without gangrene: Secondary | ICD-10-CM | POA: Insufficient documentation

## 2020-03-28 MED ORDER — MIRTAZAPINE 15 MG PO TABS: 15.0000 mg | ORAL_TABLET | Freq: Every day | ORAL | 1 refills | Status: DC

## 2020-03-28 MED ORDER — GLIPIZIDE ER 5 MG PO TB24: 5.0000 mg | ORAL_TABLET | Freq: Every day | ORAL | 1 refills | Status: DC

## 2020-03-28 NOTE — Telephone Encounter (Signed)
[name], daughter called and stated that they have decided not to go with Hospice. Stated that they cannot get the best Care for Patient's feet wounds with them so they have decided to stay with Palliative Care.   Also requesting refills on  Mirtazapine and Glipizide.

## 2020-03-28 NOTE — Progress Notes (Signed)
[name], [name] ([id number]) Visit Report for 03/28/2020 Debridement Details Patient Name: Date of Service: [name] 03/28/2020 1:15 PM Medical Record Number: [id number] Patient Account Number: [id number] Date of Birth/Sex: Treating RN: [name], [name] (86 y.o. [name], [name] Primary Care Provider: Sherrie Mustache Other Clinician: Referring Provider: Treating Provider/Extender: Deloria Lair in Treatment: 58 Debridement Performed for Assessment: Wound #3 Right Calcaneus Performed By: Physician Ricard Dillon., MD Debridement Type: Debridement Severity of Tissue Pre Debridement: Fat layer exposed Level of Consciousness (Pre-procedure): Awake and Alert Pre-procedure Verification/Time Out Yes - 14:05 Taken: Start Time: 14:05 Pain Control: Lidocaine 4% T opical Solution T Area Debrided (L x W): otal 3.4 (cm) x 4.5 (cm) = 15.3 (cm) Tissue and other material debrided: Viable, Non-Viable, Slough, Subcutaneous, Skin: Dermis , Skin: Epidermis, Slough Level: Skin/Subcutaneous Tissue Debridement Description: Excisional Instrument: Curette Bleeding: Minimum Hemostasis Achieved: Pressure End Time: 14:12 Procedural Pain: 0 Post Procedural Pain: 0 Response to Treatment: Procedure was tolerated well Level of Consciousness (Post- Awake and Alert procedure): Post Debridement Measurements of Total Wound Length: (cm) 3.4 Stage: Unstageable/Unclassified Width: (cm) 4.5 Depth: (cm) 0.4 Volume: (cm) 4.807 Character of Wound/Ulcer Post Debridement: Improved Severity of Tissue Post Debridement: Limited to breakdown of skin Post Procedure Diagnosis Same as Pre-procedure Electronic Signature(s) Signed: 03/28/2020 3:27:39 PM By: Rhae Hammock RN Signed: 03/28/2020 3:37:20 PM By: Linton Ham MD Entered By: Rhae Hammock on 03/28/2020 14:12:00 -------------------------------------------------------------------------------- HPI Details Patient  Name: Date of Service: MA [name] Fuchs E. 03/28/2020 1:15 PM Medical Record Number: [id number] Patient Account Number: [id number] Date of Birth/Sex: Treating RN: [name] 21, [name] (86 y.o. [name] Primary Care Provider: Sherrie Mustache Other Clinician: Referring Provider: Treating Provider/Extender: Deloria Lair in Treatment: 16 History of Present Illness HPI Description: ADMISSION 05/02/2019 This is a 86 year old man accompanied by his daughter. He lives in [place] with his elderly wife. He has home caregivers as well. Apparently on February 28, 2019 they noted blood on his sock and noticed a wound on the tip of his right great toe. He has been cared for by Dr. Posey Pronto who is his podiatrist in [place]. Apparently they have been using Betadine to this area. The patient has a thick mycotic nail on the right as well which is embedded in the wound in some areas. Fortunately the patient has diabetic neuropathy and is not really experiencing any pain otherwise I think this would all be quite painful. The patient was previously treated in 2019 for wounds on the left foot. This included a fairly thorough vascular review by vein and vascular Dr. Oneida Alar. His ABIs at that time were noncompressible bilaterally TBI on the right was 0.88 with biphasic waveforms on the right and monophasic waveforms at the left. He ended up having amputations of the left first and second toes. He did have an angiogram on 06/25/2017 which showed on the right the right common femoral profundofemoral for Morris and superficial femoral arteries were widely patent the right popliteal was widely patent. Tibial vessels were not well opacified secondary to motion artifact however he was felt to have all 3 tibial vessels intact with some calcification and some areas of stenosis within the posterior tibial artery but relatively good flow to the right lower leg. He also had the left reviewed  as well which was the source of the problem at that time. Past medical history; prostate cancer on oral antiandrogen therapy with bone mets, GI stromal tumor in 2005, type 2  diabetes with peripheral neuropathy, hypertension, atrial fibrillation on Eliquis, history of non-Hodgkin's lymphoma treated with CHOP in 2005. He had amputations of the left first and second toes secondary to osteomyelitis. Does not appear that he has had an x-ray of the right foot ABI in our clinic on the right was noncompressible [see full arterial discussion above]. 1/19; area on the tip of the right great toe under a very mycotic nail tip. We use silver alginate. 1/26; area of the tip of the right great toe under a very mycotic nail. A lot of this seems to have closed down we have been using silver alginate 2/9; the areas on the tip of the right great toe under a very mycotic nail. A lot of this continues to close down. I do not think there is a subungual problem. I am hopeful that this mycotic nail which is thick and raised will allow full epithelialization of the wound 2/23; this was a difficult area on the tip of his right great toe. Thick mycotic nail over the base of the wound. This appears to have closed down now. The nail itself is thick split with considerable subungual debris. READMISSION 07/25/2019 This is a 86 year old man that we cared for earlier this year in the clinic without a wound on the tip of his right great toe under a very mycotic toenail. He is a type II diabetic. His ABIs have been noncompressible however he has been felt to have adequate blood flow in the right to heal the wound. We were able to get this wound to close over as I understand things he followed with Dr. Caprice Beaver had some debridement of the nail everything looks fine and apparently the wound bed was washed off by his wife over the weekend and by Monday it was open. His daughter is able to show me pictures. Unfortunately this now  has exposed bone raising the possibility that he has had underlying osteomyelitis all along. He has been using Silvadene cream on this he has been referred back to the clinic by Dr. Caprice Beaver. He had x-rays of the right foot that did not show evidence of osteomyelitis in the right foot. He has not been on antibiotics He also had an x-ray of the left foot that showed findings suspicious for osteomyelitis involving the residual base of the first proximal phalanx and the proximal phalanx of the left second toe as well as the left second metatarsal head however he has no wounds in this area. 4/12; culture from last time of bone in the right great toe was negative. The patient's wife apparently scrubbed this toe vigorously and according to his daughter reopen the wound however unfortunately it is all exposed bone last time. I removed some of this and we use Prisma unfortunately the area does not look much better today. Previous x-rays done in podiatry office Dr. Caprice Beaver did not show evidence of osteomyelitis. Nevertheless I think osteomyelitis here is probably quite likely. Still using Prisma 4/26; he took 2 weeks of doxycycline although his daughter is telling me he is not eating. He has loose bowel movements however I did not get the sense that this is changed all that much. He still has exposed bone. Culture I did last time showed a few coag negative staph I do not think that this is necessarily relevant. I still am operating under the premise that the patient probably has osteomyelitis. After considerable difficulty we are able to determine he is not allergic to cephalosporins I therefore  gave him Keflex. 5/10; is tolerating Keflex 500 3 times daily quite well. He still has the small wound in the tip of his toe with exposed bone.Marland Kitchen Unfortunately he does not really have any granulation tissue. He saw Dr. Caprice Beaver of podiatry in [place] last week I have not had a chance to look at his notes The patient  had an extensive vascular work-up including an angiogram in 2019. He was felt to have adequate blood supply on the right at the time although he did have amputations of the left first and second toe by Dr. Oneida Alar. 09/11/19 -Patient is back and is about to finish his Keflex, appointment with Dr. Oneida Alar is in the works. The right great toe wound looks about the same, the left third toe dorsal wound has healed. There was a small eschar covering it that was removed by podiatry.According to patient's daughter the right great toe base on the plantar surface had some redness that seems to have disappeared. 10/13/2019; patient is back in clinic today after 1 month hiatus although I have not seen him since 5/10. Since he was last here he was admitted to hospital from 6/2 through 6/5 because of a nonhealing wound on his right great toe. He underwent an angiogram which showed patent common femoral, profunda, SFA above and below-knee popliteal artery. The dominant runoff in the right lower extremity was through the anterior tibial but that became very small at the level of the ankle and had minimal outflow into the foot. The peroneal artery was patent but atretic. 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 Interventions were attempted on the posterior tibial. Mechanical orbital atherectomy down to the level of the ankle and then this was angioplastied with 2.3 mm and 3 mm balloons. Flow remained very sluggish down the posterior tibial artery but was patent. Post event there apparently was embolization of an area involving the plantar left heel and sides of the left heel. He arrives back in clinic today it has been almost 7 weeks since I have seen him. Miraculously the great toe area with open bone is closed over probably as result of Dr. Ainsley Spinner interventions. He does however have an  extensive ischemic area on the plantar and medial aspect of his heel. I reviewed Dr. Ainsley Spinner last note from 3 days ago on 6/22. He did not think there was enough inflow to heal the heel wound. With eschar and sloughing. Wondered whether he could heal a below-knee amputation but more likely an above the knee amputation when required. He was placed on Xeroform to the right heel In discussing things with the patient's family and the patient present he is not in a lot of pain. The area does not appear to be grossly infected. The patient appears to be comfortable and conversational. He is eating and drinking fairly well. 7/9; the patient saw Dr. Carlis Abbott on 6/29. He had previously done heroic attempt to establish blood flow as described above. 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 Although the eschar on the heel looks ominous both plantar and medial it is certainly no worse than last week. We will continue Betadine to this area with alginate. Ultimately this will separate off and we will see about the viability of the tissue underneath. Also in clinic today our nurses were  washing his right great toe tip apparently some of the skin came off eschared and there is an open wound at the tip of the great toe with exposed bone similar to what he had previously 7/23; he has the open area on the tip of his first toe which I think is mostly bone. Then he has the embolic area on the plantar heel extending into the medial heel. We have been using Betadine and silver alginate to the heel silver alginate to the toe. His daughter brings up that she was given nitroglycerin patches by Dr. Donzetta Matters to try and open blood flow to the foot. She has not been using them out of concern of systemic hypotension and also that the labels said not to put them on extremities 8/10-Returns at 3 weeks, open area on the tip of his  first toe that is dried out, the heel with eschar, applying Betadine to the eschar area and silver alginate dressing. 9/7; 1 month follow-up. He has an open area on the tip of the toe almost subungual with a thick mycotic nail. The heel wound has black eschar but this is beginning to separate. We have been using Betadine and silver alginate on the heel and simply silver alginate on the right great toe 9/23; the patient was worked in urgently at the request of his wife was concerned about infection in the tip of the toe. Apparently this was brought up by home health. 10/14; the patient has the area of exposed bone on the tip of the right great toe. He also has the eschared area on the plantar heel which was a embolic phenomenon at the time of attempted revascularization. He also had an area of denuded epithelium over both buttocks which I think is probably a combination of pressure friction and moisture. They have been using zinc oxide which is appropriate 10/26. The patient has an area of wider exposed bone at the tip of the right great toe. The substantial eschared area on the heel was already 50% separated I remove this. Actually the patient's wife had called repetitively for urgent appointments with some degree of erythema in the foot as the primary concern. It turns out that she has been to see her primary doctor after the patient had a fall off the commode resulting in skin tears to both forearms. He was placed on doxycycline because of concern of cellulitis of the toe. She is also been to see [facility] in [place] with regards to the toe. Apparently he shaved the bone and recommended Betadine to this. Noteworthy that the patient already had an extensive arterial evaluation with an angiogram. He has known PAD with tibial vessel disease. The last effort was an attempt to open up the posterior tibial artery which resulted in symptoms of embolization to the heel. 11/9; since the  patient was last here he was brought to the ER on 11/4. He was felt to have cellulitis in the right great toe. They did not do a wound culture however they did blood cultures which showed 3 out of 3 Staph hominis. He has been treated with clindamycin 150 mg 3 times times a day. His daughter says he went to the hospital with altered LOC. He had been running a fever but was not febrile on arrival to the ER. Looking over the emergency room notes he had erythema in his foot and I think he had cellulitis. He had apparently been on doxycycline 100 mg twice daily prescribed by Dr. Caprice Beaver  prior to this. The staph isolate itself is very resistant it is sensitive to vancomycin but resistant to methicillin's, quinolones, tetracycline and Septra. During the stay in the ER she was seen by vein and vascular they noted that he does not have any further options for revascularization. They do not think that he could undergo any foot conserving surgery therefore the only thing left here would be a BKA The area of the heel that I debrided last time he is here actually looks quite healthy nice healthy looking granulation tissue over perhaps 60 to 70% of the wound still 30% slough we have been using Iodoflex. They have been using silver collagen to the toe The other issue here is the daughter is struggling with the idea of in-home hospice. I went over this with her. I emphasized that hospice is not a home care agency as a velocity of care at the end of life. Patient is 86 years old he apparently is eating and drinking less and since I have known him he has become more frail 03/11/2020 upon evaluation today patient unfortunately is showing signs of progression in regard to his right great toe. Currently he is actually experiencing increased evidence of skin deterioration and in fact this toe is developing into more of a dry gangrene issue at this point. He was signed up for hospice on Thursday and then apparently hospice  was called off on Friday due to the fact that they were talking about doing wet-to-dry dressings to both wound locations and his daughter was really concerned about this. She did not want anything to make the wounds progress faster than they should be. With that being said I think a wet-to-dry dressing would be okay for the heel location. I do not believe this would cause any harm. With that being said and in regard to the toe I really do agree I would not really want a wet-to-dry dressing here I think more of like Betadine and dry gauze dressing could be a appropriate way to go after the toe was painted with Betadine that is. 12/9. It has been a month since I saw this man. He was using Betadine to the great toe and Iodoflex to the area on the heel. The heel actually look quite good last time I saw this this is gotten larger and not nearly as healthy looking as last time. Moreover the right first toe wound now has dry gangrene Since they were last here they have not elected to sign up for hospice. They do not have home health. They are trying to get some home care through the [facility] Signature(s) Signed: 03/28/2020 3:37:20 PM By: Linton Ham MD Entered By: Linton Ham on 03/28/2020 14:19:26 -------------------------------------------------------------------------------- Physical Exam Details Patient Name: Date of Service: [name] Schwartz E. 03/28/2020 1:15 PM Medical Record Number: [id number] Patient Account Number: [id number] Date of Birth/Sex: Treating RN: [name] 13, [name] (86 y.o. [name] Primary Care Provider: Sherrie Mustache Other Clinician: Referring Provider: Treating Provider/Extender: Deloria Lair in Treatment: 35 Electronic Signature(s) Signed: 03/28/2020 3:37:20 PM By: Linton Ham MD Entered By: Linton Ham on 03/28/2020 14:19:48 -------------------------------------------------------------------------------- Physician Orders  Details Patient Name: Date of Service: [name] Schwartz E. 03/28/2020 1:15 PM Medical Record Number: [id number] Patient Account Number: [id number] Date of Birth/Sex: Treating RN: [birth year], [name] (86 y.o. [name] Primary Care Provider: Sherrie Mustache Other Clinician: Referring Provider: Treating Provider/Extender: Deloria Lair in Treatment: [phone] Verbal / Phone Orders: No Diagnosis  Coding Follow-up Appointments Return appointment in 1 month. Bathing/ Shower/ Hygiene May shower and wash wound with soap and water. - with dressing changes Edema Control - Lymphedema / SCD / Other Elevate legs to the level of the heart or above for 30 minutes daily and/or when sitting, a frequency of: Avoid standing for long periods of time. Wound Treatment Wound #2 - T Great oe Wound Laterality: Right Cleanser: Soap and Water 1 x Per Day Discharge Instructions: May shower and wash wound with dial antibacterial soap and water prior to dressing change. Prim Dressing: betadine 1 x Per Day ary Discharge Instructions: to toe daily Wound #3 - Calcaneus Wound Laterality: Right Cleanser: Soap and Water Every Other Day/30 Days Discharge Instructions: May shower and wash wound with dial antibacterial soap and water prior to dressing change. Prim Dressing: Iodosorb Gel 10 (gm) Tube (DME) (Generic) Every Other Day/30 Days ary Discharge Instructions: Apply to wound bed as instructed Secondary Dressing: ABD Pad, 5x9 (DME) Every Other Day/30 Days Discharge Instructions: Apply over primary dressing as directed. Secured With: [employer], 4.5x3.1 (in/yd) (DME) (Generic) Every Other Day/30 Days Discharge Instructions: Secure with Kerlix as directed. Secured With: Paper Tape, 2x10 (in/yd) (DME) (Generic) Every Other Day/30 Days Discharge Instructions: Secure dressing with tape as directed. Electronic Signature(s) Signed: 03/28/2020 3:27:39 PM By: Rhae Hammock RN Signed:  03/28/2020 3:37:20 PM By: Linton Ham MD Entered By: Rhae Hammock on 03/28/2020 14:15:24 -------------------------------------------------------------------------------- Problem List Details Patient Name: Date of Service: MA [name] Fuchs E. 03/28/2020 1:15 PM Medical Record Number: [id number] Patient Account Number: [id number] Date of Birth/Sex: Treating RN: [name] (86 y.o. [name] Primary Care Provider: Sherrie Mustache Other Clinician: Referring Provider: Treating Provider/Extender: Deloria Lair in Treatment: 62 Active Problems ICD-10 Encounter Code Description Active Date MDM Diagnosis E11.621 Type 2 diabetes mellitus with foot ulcer 07/25/2019 No Yes L97.514 Non-pressure chronic ulcer of other part of right foot with necrosis of bone 07/25/2019 No Yes E11.40 Type 2 diabetes mellitus with diabetic neuropathy, unspecified 07/25/2019 No Yes L97.412 Non-pressure chronic ulcer of right heel and midfoot with fat layer exposed 02/13/2020 No Yes Inactive Problems ICD-10 Code Description Active Date Inactive Date S40.811D Abrasion of right upper arm, subsequent encounter 02/13/2020 02/13/2020 S40.812D Abrasion of left upper arm, subsequent encounter 02/13/2020 02/13/2020 Resolved Problems Electronic Signature(s) Signed: 03/28/2020 3:37:20 PM By: Linton Ham MD Entered By: Linton Ham on 03/28/2020 14:Foster:05 -------------------------------------------------------------------------------- Progress Note Details Patient Name: Date of Service: MA [name] Fuchs E. 03/28/2020 1:15 PM Medical Record Number: [id number] Patient Account Number: [id number] Date of Birth/Sex: Treating RN: [birth year] (86 y.o. [name] Primary Care Provider: Sherrie Mustache Other Clinician: Referring Provider: Treating Provider/Extender: Deloria Lair in Treatment: 35 Subjective History of Present Illness  (HPI) ADMISSION 05/02/2019 This is a 86 year old man accompanied by his daughter. He lives in [place] with his elderly wife. He has home caregivers as well. Apparently on February 28, 2019 they noted blood on his sock and noticed a wound on the tip of his right great toe. He has been cared for by Dr. Posey Pronto who is his podiatrist in [place]. Apparently they have been using Betadine to this area. The patient has a thick mycotic nail on the right as well which is embedded in the wound in some areas. Fortunately the patient has diabetic neuropathy and is not really experiencing any pain otherwise I think this would all be quite painful. The patient was previously treated in 2019  for wounds on the left foot. This included a fairly thorough vascular review by vein and vascular Dr. Oneida Alar. His ABIs at that time were noncompressible bilaterally TBI on the right was 0.88 with biphasic waveforms on the right and monophasic waveforms at the left. He ended up having amputations of the left first and second toes. He did have an angiogram on 06/25/2017 which showed on the right the right common femoral profundofemoral for Morris and superficial femoral arteries were widely patent the right popliteal was widely patent. Tibial vessels were not well opacified secondary to motion artifact however he was felt to have all 3 tibial vessels intact with some calcification and some areas of stenosis within the posterior tibial artery but relatively good flow to the right lower leg. He also had the left reviewed as well which was the source of the problem at that time. Past medical history; prostate cancer on oral antiandrogen therapy with bone mets, GI stromal tumor in 2005, type 2 diabetes with peripheral neuropathy, hypertension, atrial fibrillation on Eliquis, history of non-Hodgkin's lymphoma treated with CHOP in 2005. He had amputations of the left first and second toes secondary to osteomyelitis. Does  not appear that he has had an x-ray of the right foot ABI in our clinic on the right was noncompressible [see full arterial discussion above]. 1/19; area on the tip of the right great toe under a very mycotic nail tip. We use silver alginate. 1/26; area of the tip of the right great toe under a very mycotic nail. A lot of this seems to have closed down we have been using silver alginate 2/9; the areas on the tip of the right great toe under a very mycotic nail. A lot of this continues to close down. I do not think there is a subungual problem. I am hopeful that this mycotic nail which is thick and raised will allow full epithelialization of the wound 2/23; this was a difficult area on the tip of his right great toe. Thick mycotic nail over the base of the wound. This appears to have closed down now. The nail itself is thick split with considerable subungual debris. READMISSION 07/25/2019 This is a 86 year old man that we cared for earlier this year in the clinic without a wound on the tip of his right great toe under a very mycotic toenail. He is a type II diabetic. His ABIs have been noncompressible however he has been felt to have adequate blood flow in the right to heal the wound. We were able to get this wound to close over as I understand things he followed with Dr. Caprice Beaver had some debridement of the nail everything looks fine and apparently the wound bed was washed off by his wife over the weekend and by Monday it was open. His daughter is able to show me pictures. Unfortunately this now has exposed bone raising the possibility that he has had underlying osteomyelitis all along. He has been using Silvadene cream on this he has been referred back to the clinic by Dr. Caprice Beaver. He had x-rays of the right foot that did not show evidence of osteomyelitis in the right foot. He has not been on antibiotics He also had an x-ray of the left foot that showed findings suspicious for osteomyelitis  involving the residual base of the first proximal phalanx and the proximal phalanx of the left second toe as well as the left second metatarsal head however he has no wounds in this area. 4/12; culture from  last time of bone in the right great toe was negative. The patient's wife apparently scrubbed this toe vigorously and according to his daughter reopen the wound however unfortunately it is all exposed bone last time. I removed some of this and we use Prisma unfortunately the area does not look much better today. Previous x-rays done in podiatry office Dr. Caprice Beaver did not show evidence of osteomyelitis. Nevertheless I think osteomyelitis here is probably quite likely. Still using Prisma 4/26; he took 2 weeks of doxycycline although his daughter is telling me he is not eating. He has loose bowel movements however I did not get the sense that this is changed all that much. He still has exposed bone. Culture I did last time showed a few coag negative staph I do not think that this is necessarily relevant. I still am operating under the premise that the patient probably has osteomyelitis. After considerable difficulty we are able to determine he is not allergic to cephalosporins I therefore gave him Keflex. 5/10; is tolerating Keflex 500 3 times daily quite well. He still has the small wound in the tip of his toe with exposed bone.Marland Kitchen Unfortunately he does not really have any granulation tissue. He saw Dr. Caprice Beaver of podiatry in [place] last week I have not had a chance to look at his notes The patient had an extensive vascular work-up including an angiogram in 2019. He was felt to have adequate blood supply on the right at the time although he did have amputations of the left first and second toe by Dr. Oneida Alar. 09/11/19 -Patient is back and is about to finish his Keflex, appointment with Dr. Oneida Alar is in the works. The right great toe wound looks about the same, the left third toe dorsal wound has  healed. There was a small eschar covering it that was removed by podiatry.According to patient's daughter the right great toe base on the plantar surface had some redness that seems to have disappeared. 10/13/2019; patient is back in clinic today after 1 month hiatus although I have not seen him since 5/10. Since he was last here he was admitted to hospital from 6/2 through 6/5 because of a nonhealing wound on his right great toe. He underwent an angiogram which showed patent common femoral, profunda, SFA above and below-knee popliteal artery. The dominant runoff in the right lower extremity was through the anterior tibial but that became very small at the level of the ankle and had minimal outflow into the foot. The peroneal artery was patent but atretic. 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 Interventions were attempted on the posterior tibial. Mechanical orbital atherectomy down to the level of the ankle and then this was angioplastied with 2.3 mm and 3 mm balloons. Flow remained very sluggish down the posterior tibial artery but was patent. Post event there apparently was embolization of an area involving the plantar left heel and sides of the left heel. He arrives back in clinic today it has been almost 7 weeks since I have seen him. Miraculously the great toe area with open bone is closed over probably as result of Dr. Ainsley Spinner interventions. He does however have an extensive ischemic area on the plantar and medial aspect of his heel. I reviewed Dr. Ainsley Spinner last note from 3 days ago on 6/22. He did not think there was enough inflow to heal the heel  wound. With eschar and sloughing. Wondered whether he could heal a below-knee amputation but more likely an above the knee amputation when required. He was placed on Xeroform to the right heel In discussing things with the  patient's family and the patient present he is not in a lot of pain. The area does not appear to be grossly infected. The patient appears to be comfortable and conversational. He is eating and drinking fairly well. 7/9; the patient saw Dr. Carlis Abbott on 6/29. He had previously done heroic attempt to establish blood flow as described above. 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 Although the eschar on the heel looks ominous both plantar and medial it is certainly no worse than last week. We will continue Betadine to this area with alginate. Ultimately this will separate off and we will see about the viability of the tissue underneath. Also in clinic today our nurses were washing his right great toe tip apparently some of the skin came off eschared and there is an open wound at the tip of the great toe with exposed bone similar to what he had previously 7/23; he has the open area on the tip of his first toe which I think is mostly bone. Then he has the embolic area on the plantar heel extending into the medial heel. We have been using Betadine and silver alginate to the heel silver alginate to the toe. His daughter brings up that she was given nitroglycerin patches by Dr. Donzetta Matters to try and open blood flow to the foot. She has not been using them out of concern of systemic hypotension and also that the labels said not to put them on extremities 8/10-Returns at 3 weeks, open area on the tip of his first toe that is dried out, the heel with eschar, applying Betadine to the eschar area and silver alginate dressing. 9/7; 1 month follow-up. He has an open area on the tip of the toe almost subungual with a thick mycotic nail. The heel wound has black eschar but this is beginning to separate. We have been using Betadine and silver alginate on the heel and simply silver alginate on the right great toe 9/23;  the patient was worked in urgently at the request of his wife was concerned about infection in the tip of the toe. Apparently this was brought up by home health. 10/14; the patient has the area of exposed bone on the tip of the right great toe. He also has the eschared area on the plantar heel which was a embolic phenomenon at the time of attempted revascularization. He also had an area of denuded epithelium over both buttocks which I think is probably a combination of pressure friction and moisture. They have been using zinc oxide which is appropriate 10/26. The patient has an area of wider exposed bone at the tip of the right great toe. The substantial eschared area on the heel was already 50% separated I remove this. Actually the patient's wife had called repetitively for urgent appointments with some degree of erythema in the foot as the primary concern. It turns out that she has been to see her primary doctor after the patient had a fall off the commode resulting in skin tears to both forearms. He was placed on doxycycline because of concern of cellulitis of the toe. She is also been to see [facility] in [place] with regards to the toe. Apparently  he shaved the bone and recommended Betadine to this. Noteworthy that the patient already had an extensive arterial evaluation with an angiogram. He has known PAD with tibial vessel disease. The last effort was an attempt to open up the posterior tibial artery which resulted in symptoms of embolization to the heel. 11/9; since the patient was last here he was brought to the ER on 11/4. He was felt to have cellulitis in the right great toe. They did not do a wound culture however they did blood cultures which showed 3 out of 3 Staph hominis. He has been treated with clindamycin 150 mg 3 times times a day. His daughter says he went to the hospital with altered LOC. He had been running a fever but was not febrile on arrival to the ER. Looking  over the emergency room notes he had erythema in his foot and I think he had cellulitis. He had apparently been on doxycycline 100 mg twice daily prescribed by Dr. Caprice Beaver prior to this. The staph isolate itself is very resistant it is sensitive to vancomycin but resistant to methicillin's, quinolones, tetracycline and Septra. During the stay in the ER she was seen by vein and vascular they noted that he does not have any further options for revascularization. They do not think that he could undergo any foot conserving surgery therefore the only thing left here would be a BKA The area of the heel that I debrided last time he is here actually looks quite healthy nice healthy looking granulation tissue over perhaps 60 to 70% of the wound still 30% slough we have been using Iodoflex. They have been using silver collagen to the toe The other issue here is the daughter is struggling with the idea of in-home hospice. I went over this with her. I emphasized that hospice is not a home care agency as a velocity of care at the end of life. Patient is 86 years old he apparently is eating and drinking less and since I have known him he has become more frail 03/11/2020 upon evaluation today patient unfortunately is showing signs of progression in regard to his right great toe. Currently he is actually experiencing increased evidence of skin deterioration and in fact this toe is developing into more of a dry gangrene issue at this point. He was signed up for hospice on Thursday and then apparently hospice was called off on Friday due to the fact that they were talking about doing wet-to-dry dressings to both wound locations and his daughter was really concerned about this. She did not want anything to make the wounds progress faster than they should be. With that being said I think a wet-to-dry dressing would be okay for the heel location. I do not believe this would cause any harm. With that being said and in  regard to the toe I really do agree I would not really want a wet-to-dry dressing here I think more of like Betadine and dry gauze dressing could be a appropriate way to go after the toe was painted with Betadine that is. 12/9. It has been a month since I saw this man. He was using Betadine to the great toe and Iodoflex to the area on the heel. The heel actually look quite good last time I saw this this is gotten larger and not nearly as healthy looking as last time. Moreover the right first toe wound now has dry gangrene Since they were last here they have not elected to sign up  for hospice. They do not have home health. They are trying to get some home care through the [place] [facility] Constitutional Vitals Time Taken: 1:34 PM, Height: 70 in, Weight: 140 lbs, BMI: 20.1, Temperature: 98.3 F, Pulse: 74 bpm, Respiratory Rate: 18 breaths/min, Blood Pressure: 102/49 mmHg. Integumentary (Hair, Skin) Wound #2 status is Open. Original cause of wound was Gradually Appeared. The wound is located on the Right T Great. The wound measures 2.7cm length x oe 4.6cm width x 0.1cm depth; 9.755cm^2 area and 0.975cm^3 volume. There is Fat Layer (Subcutaneous Tissue) exposed. There is no tunneling or undermining noted. There is a medium amount of serosanguineous drainage noted. The wound margin is indistinct and nonvisible. There is no granulation within the wound bed. There is a large (67-100%) amount of necrotic tissue within the wound bed including Eschar. Wound #3 status is Open. Original cause of wound was Gradually Appeared. The wound is located on the Right Calcaneus. The wound measures 3.4cm length x 4.5cm width x 0.4cm depth; 12.017cm^2 area and 4.807cm^3 volume. There is Fat Layer (Subcutaneous Tissue) exposed. There is no tunneling or undermining noted. There is a medium amount of serosanguineous drainage noted. The wound margin is flat and intact. There is small (1-33%) pink granulation within the wound  bed. There is a large (67-100%) amount of necrotic tissue within the wound bed including Adherent Slough. Assessment Active Problems ICD-10 Type 2 diabetes mellitus with foot ulcer Non-pressure chronic ulcer of other part of right foot with necrosis of bone Type 2 diabetes mellitus with diabetic neuropathy, unspecified Non-pressure chronic ulcer of right heel and midfoot with fat layer exposed Procedures Wound #3 Pre-procedure diagnosis of Wound #3 is a Pressure Ulcer located on the Right Calcaneus .Severity of Tissue Pre Debridement is: Fat layer exposed. There was a Excisional Skin/Subcutaneous Tissue Debridement with a total area of 15.3 sq cm performed by Ricard Dillon., MD. With the following instrument(s): Curette to remove Viable and Non-Viable tissue/material. Material removed includes Subcutaneous Tissue, Slough, Skin: Dermis, and Skin: Epidermis after achieving pain control using Lidocaine 4% Topical Solution. No specimens were taken. A time out was conducted at 14:05, prior to the start of the procedure. A Minimum amount of bleeding was controlled with Pressure. The procedure was tolerated well with a pain level of 0 throughout and a pain level of 0 following the procedure. Post Debridement Measurements: 3.4cm length x 4.5cm width x 0.4cm depth; 4.807cm^3 volume. Post debridement Stage noted as Unstageable/Unclassified. Character of Wound/Ulcer Post Debridement is improved. Severity of Tissue Post Debridement is: Limited to breakdown of skin. Post procedure Diagnosis Wound #3: Same as Pre-Procedure Plan Follow-up Appointments: Return appointment in 1 month. Bathing/ Shower/ Hygiene: May shower and wash wound with soap and water. - with dressing changes Edema Control - Lymphedema / SCD / Other: Elevate legs to the level of the heart or above for 30 minutes daily and/or when sitting, a frequency of: Avoid standing for long periods of time. WOUND #2: - T Great Wound Laterality:  Right oe Cleanser: Soap and Water 1 x Per Day/ Discharge Instructions: May shower and wash wound with dial antibacterial soap and water prior to dressing change. Prim Dressing: betadine 1 x Per Day/ ary Discharge Instructions: to toe daily WOUND #3: - Calcaneus Wound Laterality: Right Cleanser: Soap and Water Every Other Day/30 Days Discharge Instructions: May shower and wash wound with dial antibacterial soap and water prior to dressing change. Prim Dressing: Iodosorb Gel 10 (gm) Tube (DME) (Generic) Every Other  Day/30 Days ary Discharge Instructions: Apply to wound bed as instructed Secondary Dressing: ABD Pad, 5x9 (DME) Every Other Day/30 Days Discharge Instructions: Apply over primary dressing as directed. Secured With: [employer], 4.5x3.1 (in/yd) (DME) (Generic) Every Other Day/30 Days Discharge Instructions: Secure with Kerlix as directed. Secured With: Paper T ape, 2x10 (in/yd) (DME) (Generic) Every Other Day/30 Days Discharge Instructions: Secure dressing with tape as directed. 1. We are going to continue with Iodoflex to the heel wound. This is not as good as when I saw this a month ago but it does not look too bad. I used a #5 curette to remove eschar from around the wound circumference and subcutaneous debris. 2. 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 I told him that this is not going to heal. Fortunately he has neuropathy and he is not in any pain. 3. I am not sure I totally understand where her daughter is coming from in terms of care. They simply want the best wound care and that is what they told me. They are trying to get in-home care as well. Electronic Signature(s) Signed: 03/28/2020 3:37:20 PM By: Linton Ham MD Entered By: Linton Ham on 03/28/2020 14:21:11 -------------------------------------------------------------------------------- SuperBill Details Patient Name: Date of Service: MA [name] Fuchs E. 03/28/2020 Medical Record Number:  [id number] Patient Account Number: [id number] Date of Birth/Sex: Treating RN: [name] (86 y.o. [name], [name] Primary Care Provider: Sherrie Mustache Other Clinician: Referring Provider: Treating Provider/Extender: Deloria Lair in Treatment: 35 Diagnosis Coding ICD-10 Codes Code Description E11.621 Type 2 diabetes mellitus with foot ulcer L97.514 Non-pressure chronic ulcer of other part of right foot with necrosis of bone E11.40 Type 2 diabetes mellitus with diabetic neuropathy, unspecified L97.412 Non-pressure chronic ulcer of right heel and midfoot with fat layer exposed Facility Procedures CPT4 Code: [id number] Description: [facility] - DEB SUBQ TISSUE 20 SQ CM/< ICD-10 Diagnosis Description L97.412 Non-pressure chronic ulcer of right heel and midfoot with fat layer exposed Modifier: Quantity: 1 Physician Procedures : CPT4 Code Description Modifier [id number] - WC PHYS SUBQ TISS 20 SQ CM ICD-10 Diagnosis Description L97.412 Non-pressure chronic ulcer of right heel and midfoot with fat layer exposed Quantity: 1 Electronic Signature(s) Signed: 03/28/2020 3:37:20 PM By: Linton Ham MD Entered By: Linton Ham on 03/28/2020 14:21:28

## 2020-03-28 NOTE — Telephone Encounter (Signed)
Okay, we are here if they need anything. Refills provided.

## 2020-03-28 NOTE — Progress Notes (Signed)
PADRAIG, NHAN ([id number]) Visit Report for 03/28/2020 Arrival Information Details Patient Name: Date of Service: [name] 03/28/2020 1:15 PM Medical Record Number: [id number] Patient Account Number: [id number] Date of Birth/Sex: Treating RN: [birth year] (86 y.o. [name]) Carlene Coria Primary Care Tressia Labrum: Sherrie Mustache Other Clinician: Referring Gwendolen Hewlett: Treating Verta Riedlinger/Extender: Deloria Lair in Treatment: 1 Visit Information History Since Last Visit All ordered tests and consults were completed: No Patient Arrived: Wheel Chair Added or deleted any medications: No Arrival Time: 13:33 Any new allergies or adverse reactions: No Accompanied By: daughter Had a fall or experienced change in No Transfer Assistance: Manual activities of daily living that may affect Patient Identification Verified: Yes risk of falls: Secondary Verification Process Completed: Yes Signs or symptoms of abuse/neglect since last visito No Patient Requires Transmission-Based Precautions: No Hospitalized since last visit: No Patient Has Alerts: Yes Implantable device outside of the clinic excluding No Patient Alerts: R ABI non compressible cellular tissue based products placed in the center since last visit: Has Dressing in Place as Prescribed: Yes Pain Present Now: No Electronic Signature(s) Signed: 03/28/2020 3:37:51 PM By: Carlene Coria RN Entered By: Carlene Coria on 03/28/2020 13:34:00 -------------------------------------------------------------------------------- Encounter Discharge Information Details Patient Name: Date of Service: [name] E. 03/28/2020 1:15 PM Medical Record Number: [id number] Patient Account Number: [id number] Date of Birth/Sex: Treating RN: [birth year] (86 y.o. [name] Primary Care Ivannah Zody: Sherrie Mustache Other Clinician: Referring Kayden Amend: Treating Tesa Meadors/Extender: Deloria Lair in Treatment:  35 Encounter Discharge Information Items Post Procedure Vitals Discharge Condition: Stable Temperature (F): 98.3 Ambulatory Status: Wheelchair Pulse (bpm): 74 Discharge Destination: Home Respiratory Rate (breaths/min): 18 Transportation: Private Auto Blood Pressure (mmHg): 102/49 Accompanied By: caregivers Schedule Follow-up Appointment: Yes Clinical Summary of Care: Patient Declined Electronic Signature(s) Signed: 03/28/2020 4:05:55 PM By: Levan Hurst RN, BSN Entered By: Levan Hurst on 03/28/2020 16:02:02 -------------------------------------------------------------------------------- Lower Extremity Assessment Details Patient Name: Date of Service: [name] E. 03/28/2020 1:15 PM Medical Record Number: [id number] Patient Account Number: [id number] Date of Birth/Sex: Treating RN: [birth year] (86 y.o. [name] Primary Care Marlei Glomski: Sherrie Mustache Other Clinician: Referring Raevon Broom: Treating Natoya Viscomi/Extender: Deloria Lair in Treatment: 35 Edema Assessment Assessed: Shirlyn Goltz: No] [Right: No] Edema: [Left: N] [Right: o] Calf Left: Right: Point of Measurement: From Medial Instep 26 cm Ankle Left: Right: Point of Measurement: From Medial Instep 20 cm Electronic Signature(s) Signed: 03/28/2020 3:37:51 PM By: Carlene Coria RN Entered By: Carlene Coria on 03/28/2020 13:41:45 -------------------------------------------------------------------------------- Multi Wound Chart Details Patient Name: Date of Service: [name] E. 03/28/2020 1:15 PM Medical Record Number: [id number] Patient Account Number: [id number] Date of Birth/Sex: Treating RN: 1923-10-07 (86 y.o. [name] Primary Care Nicholis Stepanek: Sherrie Mustache Other Clinician: Referring Jennavie Martinek: Treating Malicia Blasdel/Extender: Deloria Lair in Treatment: 35 Vital Signs Height(in): 70 Pulse(bpm): 27 Weight(lbs): 140 Blood Pressure(mmHg):  102/49 Body Mass Index(BMI): 20 Temperature(F): 98.3 Respiratory Rate(breaths/min): 18 Photos: [2:No Photos Right T Great oe] [3:No Photos Right Calcaneus] [N/A:N/A N/A] Wound Location: [2:Gradually Appeared] [3:Gradually Appeared] [N/A:N/A] Wounding Event: [2:Diabetic Wound/Ulcer of the Lower] [3:Pressure Ulcer] [N/A:N/A] Primary Etiology: [2:Extremity N/A] [3:Arterial Insufficiency Ulcer] [N/A:N/A] Secondary Etiology: [2:Cataracts, Arrhythmia, Hypertension,] [3:Cataracts, Arrhythmia, Hypertension,] [N/A:N/A] Comorbid History: [2:Peripheral Arterial Disease, Type II Diabetes, Osteomyelitis, Dementia, Neuropathy, Received Chemotherapy 06/15/2019] [3:Peripheral Arterial Disease, Type II Diabetes, Osteomyelitis, Dementia, Neuropathy, Received Chemotherapy  09/21/2019] [N/A:N/A] Date Acquired: [2:35] [3:23] [N/A:N/A] Weeks of Treatment: [2:Open] [3:Open] [N/A:N/A] Wound Status: [2:2.7x4.6x0.1] [3:3.4x4.5x0.4] [  N/A:N/A] Measurements L x W x D (cm) [2:9.755] [3:12.017] [N/A:N/A] A (cm) : rea [2:0.975] [3:4.807] [N/A:N/A] Volume (cm) : [2:-10277.70%] [3:72.00%] [N/A:N/A] % Reduction in Area: [2:-3382.10%] [3:-12.10%] [N/A:N/A] % Reduction in Volume: [2:Grade 2] [3:Unstageable/Unclassified] [N/A:N/A] Classification: [2:Medium] [3:Medium] [N/A:N/A] Exudate A mount: [2:Serosanguineous] [3:Serosanguineous] [N/A:N/A] Exudate Type: [2:red, brown] [3:red, brown] [N/A:N/A] Exudate Color: [2:Indistinct, nonvisible] [3:Flat and Intact] [N/A:N/A] Wound Margin: [2:None Present (0%)] [3:Small (1-33%)] [N/A:N/A] Granulation Amount: [2:N/A] [3:Pink] [N/A:N/A] Granulation Quality: [2:Large (67-100%)] [3:Large (67-100%)] [N/A:N/A] Necrotic Amount: [2:Eschar] [3:Adherent Slough] [N/A:N/A] Necrotic Tissue: [2:Fat Layer (Subcutaneous Tissue): Yes Fat Layer (Subcutaneous Tissue): Yes N/A] Exposed Structures: [2:Fascia: No Tendon: No Muscle: No Joint: No Bone: No None] [3:Fascia: No Tendon: No Muscle: No Joint:  No Bone: No None] [N/A:N/A] Epithelialization: [2:N/A] [3:Debridement - Excisional] [N/A:N/A] Debridement: Pre-procedure Verification/Time Out N/A [3:14:05] [N/A:N/A] Taken: [2:N/A] [3:Lidocaine 4% Topical Solution] [N/A:N/A] Pain Control: [2:N/A] [3:Subcutaneous, Slough] [N/A:N/A] Tissue Debrided: [2:N/A] [3:Skin/Subcutaneous Tissue] [N/A:N/A] Level: [2:N/A] [3:15.3] [N/A:N/A] Debridement A (sq cm): [2:rea N/A] [3:Curette] [N/A:N/A] Instrument: [2:N/A] [3:Minimum] [N/A:N/A] Bleeding: [2:N/A] [3:Pressure] [N/A:N/A] Hemostasis A chieved: [2:N/A] [3:0] [N/A:N/A] Procedural Pain: [2:N/A] [3:0] [N/A:N/A] Post Procedural Pain: [2:N/A] [3:Procedure was tolerated well] [N/A:N/A] Debridement Treatment Response: [2:N/A] [3:3.4x4.5x0.4] [N/A:N/A] Post Debridement Measurements L x W x D (cm) [2:N/A] [3:4.807] [N/A:N/A] Post Debridement Volume: (cm) [2:N/A] [3:Unstageable/Unclassified] [N/A:N/A] Post Debridement Stage: [2:N/A] [3:Debridement] [N/A:N/A] Treatment Notes Electronic Signature(s) Signed: 03/28/2020 3:37:20 PM By: Linton Ham MD Signed: 03/28/2020 4:11:46 PM By: Deon Pilling Entered By: Linton Ham on 03/28/2020 14:17:17 -------------------------------------------------------------------------------- Multi-Disciplinary Care Plan Details Patient Name: Date of Service: [place] [name] 03/28/2020 1:15 PM Medical Record Number: [id number] Patient Account Number: [id number] Date of Birth/Sex: Treating RN: September 27, 1923 (86 y.o. Burnadette Pop, Lauren Primary Care Hailea Eaglin: Sherrie Mustache Other Clinician: Referring Genecis Veley: Treating Maribell Demeo/Extender: Deloria Lair in Treatment: 35 Active Inactive Wound/Skin Impairment Nursing Diagnoses: Knowledge deficit related to ulceration/compromised skin integrity Goals: Patient/caregiver will verbalize understanding of skin care regimen Date Initiated: 07/25/2019 Target Resolution Date: 05/14/2020 Goal  Status: Active Ulcer/skin breakdown will have a volume reduction of 30% by week 4 Date Initiated: 07/25/2019 Date Inactivated: 08/28/2019 Target Resolution Date: 08/25/2019 Goal Status: Unmet Unmet Reason: Osteomyelitis Interventions: Assess patient/caregiver ability to obtain necessary supplies Assess patient/caregiver ability to perform ulcer/skin care regimen upon admission and as needed Assess ulceration(s) every visit Notes: Electronic Signature(s) Signed: 03/28/2020 1:43:26 PM By: Rhae Hammock RN Entered By: Rhae Hammock on 03/28/2020 13:43:25 -------------------------------------------------------------------------------- Pain Assessment Details Patient Name: Date of Service: [name] E. 03/28/2020 1:15 PM Medical Record Number: [id number] Patient Account Number: [id number] Date of Birth/Sex: Treating RN: [birth year] (86 y.o. [name] Primary Care Kianna Billet: Sherrie Mustache Other Clinician: Referring Jatia Musa: Treating Tomer Chalmers/Extender: Deloria Lair in Treatment: 35 Active Problems Location of Pain Severity and Description of Pain Patient Has Paino No Site Locations Pain Management and Medication Current Pain Management: Electronic Signature(s) Signed: 03/28/2020 3:37:51 PM By: Carlene Coria RN Entered By: Carlene Coria on 03/28/2020 13:35:19 -------------------------------------------------------------------------------- Patient/Caregiver Education Details Patient Name: Date of Service: [name] 12/9/2021andnbsp1:15 PM Medical Record Number: [id number] Patient Account Number: [id number] Date of Birth/Gender: Treating RN: [birth year] (86 y.o. Erie [name] Primary Care Physician: Sherrie Mustache Other Clinician: Referring Physician: Treating Physician/Extender: Deloria Lair in Treatment: 42 Education Assessment Education Provided To: Patient Education Topics  Provided Wound/Skin Impairment: Methods: Explain/Verbal Responses: State content correctly [employer]) Signed: 03/28/2020 3:27:39 PM By: Rhae Hammock RN Entered By: Rhae Hammock on  03/28/2020 13:43:45 -------------------------------------------------------------------------------- Wound Assessment Details Patient Name: Date of Service: [name] 03/28/2020 1:15 PM Medical Record Number: [id number] Patient Account Number: [id number] Date of Birth/Sex: Treating RN: [birth year] (86 y.o. [name]) Carlene Coria Primary Care Avanelle Pixley: Sherrie Mustache Other Clinician: Referring Saki Legore: Treating Benedetto Ryder/Extender: Deloria Lair in Treatment: 35 Wound Status Wound Number: 2 Primary Diabetic Wound/Ulcer of the Lower Extremity Etiology: Wound Location: Right T Great oe Wound Open Wounding Event: Gradually Appeared Status: Date Acquired: 06/15/2019 Comorbid Cataracts, Arrhythmia, Hypertension, Peripheral Arterial Disease, Weeks Of Treatment: 35 History: Type II Diabetes, Osteomyelitis, Dementia, Neuropathy, Received Clustered Wound: No Chemotherapy Wound Measurements Length: (cm) 2.7 Width: (cm) 4.6 Depth: (cm) 0.1 Area: (cm) 9.755 Volume: (cm) 0.975 % Reduction in Area: -10277.7% % Reduction in Volume: -3382.1% Epithelialization: None Tunneling: No Undermining: No Wound Description Classification: Grade 2 Wound Margin: Indistinct, nonvisible Exudate Amount: Medium Exudate Type: Serosanguineous Exudate Color: red, brown Foul Odor After Cleansing: No Slough/Fibrino Yes Wound Bed Granulation Amount: None Present (0%) Exposed Structure Necrotic Amount: Large (67-100%) Fascia Exposed: No Necrotic Quality: Eschar Fat Layer (Subcutaneous Tissue) Exposed: Yes Tendon Exposed: No Muscle Exposed: No Joint Exposed: No Bone Exposed: No Treatment Notes Wound #2 (Toe Great) Wound Laterality: Right Cleanser Soap and  Water Discharge Instruction: May shower and wash wound with dial antibacterial soap and water prior to dressing change. Peri-Wound Care Topical Primary Dressing betadine Discharge Instruction: to toe daily Secondary Dressing Secured With Compression Wrap Compression Stockings Add-Ons Electronic Signature(s) Signed: 03/28/2020 3:37:51 PM By: Carlene Coria RN Entered By: Carlene Coria on 03/28/2020 13:43:21 -------------------------------------------------------------------------------- Wound Assessment Details Patient Name: Date of Service: [name] 03/28/2020 1:15 PM Medical Record Number: [id number] Patient Account Number: [id number] Date of Birth/Sex: Treating RN: June 30, 1923 (86 y.o. [name]) Carlene Coria Primary Care Shyra Emile: Sherrie Mustache Other Clinician: Referring Trentan Trippe: Treating Drayden Lukas/Extender: Deloria Lair in Treatment: 35 Wound Status Wound Number: 3 Primary Pressure Ulcer Etiology: Wound Location: Right Calcaneus Secondary Arterial Insufficiency Ulcer Wounding Event: Gradually Appeared Etiology: Date Acquired: 09/21/2019 Wound Open Weeks Of Treatment: 23 Status: Clustered Wound: No Comorbid Cataracts, Arrhythmia, Hypertension, Peripheral Arterial Disease, History: Type II Diabetes, Osteomyelitis, Dementia, Neuropathy, Received Chemotherapy Wound Measurements Length: (cm) 3.4 Width: (cm) 4.5 Depth: (cm) 0.4 Area: (cm) 12.017 Volume: (cm) 4.807 % Reduction in Area: 72% % Reduction in Volume: -12.1% Epithelialization: None Tunneling: No Undermining: No Wound Description Classification: Unstageable/Unclassified Wound Margin: Flat and Intact Exudate Amount: Medium Exudate Type: Serosanguineous Exudate Color: red, brown Foul Odor After Cleansing: No Slough/Fibrino Yes Wound Bed Granulation Amount: Small (1-33%) Exposed Structure Granulation Quality: Pink Fascia Exposed: No Necrotic Amount: Large (67-100%) Fat  Layer (Subcutaneous Tissue) Exposed: Yes Necrotic Quality: Adherent Slough Tendon Exposed: No Muscle Exposed: No Joint Exposed: No Bone Exposed: No Treatment Notes Wound #3 (Calcaneus) Wound Laterality: Right Cleanser Soap and Water Discharge Instruction: May shower and wash wound with dial antibacterial soap and water prior to dressing change. Peri-Wound Care Topical Primary Dressing Iodosorb Gel 10 (gm) Tube Discharge Instruction: Apply to wound bed as instructed Secondary Dressing ABD Pad, 5x9 Discharge Instruction: Apply over primary dressing as directed. Secured With [employer], 4.5x3.1 (in/yd) Discharge Instruction: Secure with Kerlix as directed. Paper Tape, 2x10 (in/yd) Discharge Instruction: Secure dressing with tape as directed. Compression Wrap Compression Stockings Add-Ons Electronic Signature(s) Signed: 03/28/2020 3:37:51 PM By: Carlene Coria RN Entered By: Carlene Coria on 03/28/2020 13:44:01 -------------------------------------------------------------------------------- Vitals Details Patient Name: Date of Service: MA RTIN, FRA NK E.  03/28/2020 1:15 PM Medical Record Number: [id number] Patient Account Number: [id number] Date of Birth/Sex: Treating RN: 12-05-1923 (86 y.o. [name]) Carlene Coria Primary Care Oisin Yoakum: Sherrie Mustache Other Clinician: Referring Konya Fauble: Treating Traeton Bordas/Extender: Deloria Lair in Treatment: 35 Vital Signs Time Taken: 13:34 Temperature (F): 98.3 Height (in): 70 Pulse (bpm): 74 Weight (lbs): 140 Respiratory Rate (breaths/min): 18 Body Mass Index (BMI): 20.1 Blood Pressure (mmHg): 102/49 Reference Range: 80 - 120 mg / dl Electronic Signature(s) Signed: 03/28/2020 3:37:51 PM By: Carlene Coria RN Entered By: Carlene Coria on 03/28/2020 13:35:13

## 2020-04-02 DIAGNOSIS — E1151 Type 2 diabetes mellitus with diabetic peripheral angiopathy without gangrene: Secondary | ICD-10-CM | POA: Diagnosis not present

## 2020-04-02 DIAGNOSIS — E1142 Type 2 diabetes mellitus with diabetic polyneuropathy: Secondary | ICD-10-CM | POA: Diagnosis not present

## 2020-04-02 DIAGNOSIS — B351 Tinea unguium: Secondary | ICD-10-CM | POA: Diagnosis not present

## 2020-04-09 ENCOUNTER — Ambulatory Visit: Payer: Medicare Other | Admitting: Vascular Surgery

## 2020-04-11 DIAGNOSIS — I482 Chronic atrial fibrillation, unspecified: Secondary | ICD-10-CM

## 2020-04-11 DIAGNOSIS — C61 Malignant neoplasm of prostate: Secondary | ICD-10-CM

## 2020-04-11 DIAGNOSIS — E11621 Type 2 diabetes mellitus with foot ulcer: Secondary | ICD-10-CM | POA: Diagnosis not present

## 2020-04-11 DIAGNOSIS — I10 Essential (primary) hypertension: Secondary | ICD-10-CM

## 2020-04-11 DIAGNOSIS — L22 Diaper dermatitis: Secondary | ICD-10-CM | POA: Diagnosis not present

## 2020-04-11 DIAGNOSIS — L89610 Pressure ulcer of right heel, unstageable: Secondary | ICD-10-CM | POA: Diagnosis not present

## 2020-04-11 DIAGNOSIS — L97514 Non-pressure chronic ulcer of other part of right foot with necrosis of bone: Secondary | ICD-10-CM | POA: Diagnosis not present

## 2020-04-11 DIAGNOSIS — E1151 Type 2 diabetes mellitus with diabetic peripheral angiopathy without gangrene: Secondary | ICD-10-CM

## 2020-04-15 ENCOUNTER — Encounter: Payer: Self-pay | Admitting: Nurse Practitioner

## 2020-04-15 ENCOUNTER — Encounter: Payer: Medicare Other | Admitting: Nurse Practitioner

## 2020-04-15 ENCOUNTER — Other Ambulatory Visit: Payer: Self-pay

## 2020-04-15 MED ORDER — APIXABAN 5 MG PO TABS
5.0000 mg | ORAL_TABLET | Freq: Two times a day (BID) | ORAL | 3 refills | Status: DC
Start: 2020-04-15 — End: 2020-08-11

## 2020-04-15 NOTE — Telephone Encounter (Signed)
Refilled eliquis 5 mg bID #180 to [facility]

## 2020-04-16 ENCOUNTER — Ambulatory Visit
Admission: RE | Admit: 2020-04-16 | Discharge: 2020-04-16 | Disposition: A | Discharge status: Home / Self Care | Payer: Medicare Other | Source: Ambulatory Visit | Attending: Family | Admitting: Family

## 2020-04-16 ENCOUNTER — Encounter: Payer: Self-pay | Admitting: Family

## 2020-04-16 ENCOUNTER — Other Ambulatory Visit: Payer: Self-pay

## 2020-04-16 ENCOUNTER — Ambulatory Visit (INDEPENDENT_AMBULATORY_CARE_PROVIDER_SITE_OTHER): Payer: Medicare Other | Admitting: Family

## 2020-04-16 VITALS — BP 120/84 | HR 68 | Temp 97.3°F | Resp 16 | Ht 70.0 in

## 2020-04-16 DIAGNOSIS — R059 Cough, unspecified: Secondary | ICD-10-CM

## 2020-04-16 DIAGNOSIS — L89151 Pressure ulcer of sacral region, stage 1: Secondary | ICD-10-CM | POA: Diagnosis not present

## 2020-04-16 DIAGNOSIS — J069 Acute upper respiratory infection, unspecified: Secondary | ICD-10-CM | POA: Diagnosis not present

## 2020-04-16 LAB — POCT INFLUENZA A/B
Influenza A, POC: NEGATIVE
Influenza B, POC: NEGATIVE

## 2020-04-16 LAB — POCT RAPID STREP A (OFFICE): Rapid Strep A Screen: NEGATIVE

## 2020-04-16 MED ORDER — FUROSEMIDE 20 MG PO TABS: 10.0000 mg | ORAL_TABLET | Freq: Every day | ORAL | 0 refills | Status: DC

## 2020-04-16 MED ORDER — SACCHAROMYCES BOULARDII 250 MG PO CAPS: 250.0000 mg | ORAL_CAPSULE | Freq: Two times a day (BID) | ORAL | 0 refills | Status: AC

## 2020-04-16 MED ORDER — DOXYCYCLINE HYCLATE 100 MG PO TABS: 100.0000 mg | ORAL_TABLET | Freq: Two times a day (BID) | ORAL | 0 refills | Status: DC

## 2020-04-16 NOTE — Progress Notes (Signed)
Provider: Arneda Sappington FNP-C  [name], NP  Patient Care Team: [name], NP as PCP - General (Geriatric Medicine) [name], MD as PCP - Cardiology (Cardiology) [name], MD as Consulting Physician (Urology) [name], MD (Inactive) as Consulting Physician (Hematology and Oncology) [name], MD as Consulting Physician (Orthopedic Surgery) [name], DPM as Consulting Physician (Podiatry) [name], MD as Consulting Physician (Hematology) [name], MD (Dermatology) [name], MD as Consulting Physician (Gastroenterology) [name], MD as Consulting Physician (Vascular Surgery) Sater, [name], MD (Neurology) Clinic, [name], [facility] (Ophthalmology)  Extended Emergency Contact Information Primary Emergency Contact: Courtney Heys, [place] Johnnette Litter of [place] Phone: [phone] Mobile Phone: [phone] Relation: Daughter Secondary Emergency Contact: Marsh,Valerie Address: [address]          [place], [place] [address] [place] Phone: [phone] Relation: Daughter  Code Status: Full Code  Goals of care: Advanced Directive information Advanced Directives 04/16/2020  Does Patient Have a Medical Advance Directive? Yes  Type of Paramedic of [place];Living will;Out of facility DNR (pink MOST or yellow form)  Does patient want to make changes to medical advance directive? No - Patient declined  Copy of [place] in Chart? Yes - validated most recent copy scanned in chart (See row information)  Would patient like information on creating a medical advance directive? -  Pre-existing out of facility DNR order (yellow form or pink MOST form) Yellow form placed in chart (order not valid for inpatient use)     Chief Complaint  Patient presents with  . Acute Visit     Cough, SOB, and Sore Throat x 3 days.    HPI:  Pt is a 86 y.o. male seen today for an acute visit for evaluation of worsening cough,congestion and shortness of breath x 3 days.Yesterday throat felt " funny". Has had no fever or chills but stays cold.appetite has not changed.He was seen by [facility] was told to follow up today due to right lung sound. On Thursday night had a possible aspiration during the night.HOB was elevated  But he kept coughing.  Also wound like evaluation of sacral wound.Home Health Nurse request orders for wound care.Has gangrene wound also on right foot dressing changed by HHN.    Past Medical History:  Diagnosis Date  . Acute bronchitis   . Acute lower UTI 11/25/2017  . Amputation of toe of left foot ([place]) 07/30/2017, 08/27/2017    Great toe 07/30/2017 Second toe 08/27/2017   . Arthritis   . Aspiration pneumonia ([place]) 01/31/2018  . Atrial fibrillation ([place])    Dr. Harl Bowie- [place] follows saw 11'14  . Bladder stones    tx. with oral meds and antibiotics, now surgery planned  . Bone metastases ([place]) 08/21/2015  . Cataracts, both eyes    surgery planned May 2015  . Community acquired pneumonia 04/16/2018  . Cystoid macular edema 04/21/2015    Right eye 04/21/2015   . Dehydration 04/16/2018  . Dementia without behavioral disturbance ([place]) 04/16/2018  . Diabetes mellitus without complication ([place])    Type II  . Diarrhea 11/25/2017  . Dyspnea    with activity  . Dysrhythmia    Afib  . Family history of breast cancer   . Family history of colon cancer   . Genetic testing 02/09/2017  . GERD (gastroesophageal reflux disease)   .  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 Margot Chimes, thus far without recurrence.   Marland Kitchen History of MRI of  lumbar spine 03/05/2014  . Hypertension   . Hypertrophy of prostate with urinary obstruction   . Hypomagnesemia 03/16/2017  . Metabolic encephalopathy 66/29/4765  . Metastatic adenocarcinoma to prostate ([place]) 09/08/2013  . 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 NHL (non-Hodgkin's lymphoma) ([place]) 2005   Diffuse large B-cell lymphoma, clinically stage IIIA, CD20 positive, status post cervical lymph node biopsy 08/03/2003 on the left. PET scan was also positive in the spleen and small bowel region, but bone marrow aspiration and biopsy were negative. So he essentially had stage IIIAs. He received R-CHOP x6 cycles with CR established by PET scan criteria on 11/23/2003 with no evidence for relapse th  . Numbness 02/19/2017   Per [facility] new patient packet  . Osteomyelitis ([place]) 06/29/2017   toe of left foot   . PAD (peripheral artery disease) ([place]) 08/27/2017  . Polyneuropathy 02/19/2017  . Postural dizziness with presyncope 02/19/2017  . S/P TURP 08/10/2013  . Sepsis ([place]) 07/02/2018  . Skin cancer    Basal cell- face,head, neck,  arms, legs, Back  . Urinary frequency 02/19/2017   Past Surgical History:  Procedure Laterality Date  . ABDOMINAL AORTOGRAM W/LOWER EXTREMITY N/A 09/20/2019   Procedure: ABDOMINAL AORTOGRAM W/LOWER EXTREMITY;  Surgeon: Marty Heck, MD;  Location: [facility];  Service: Cardiovascular;  Laterality: N/A;  . AMPUTATION Left 06/29/2017   Procedure: AMPUTATION LEFT GREAT TOE;  Surgeon: [name], MD;  Location: [facility];  Service: Vascular;  Laterality: Left;  . AMPUTATION Left 08/27/2017   Procedure: AMPUTATION Left SECOND TOE;  Surgeon: [name], MD;  Location: [facility];  Service: Vascular;  Laterality: Left;  . CATARACT EXTRACTION Bilateral 2015  . CHOLECYSTECTOMY    . COLON RESECTION     small bowel  . CYSTOSCOPY WITH LITHOLAPAXY N/A 08/10/2013   Procedure: CYSTOSCOPY WITH LITHOLAPAXY WITH [name];  Surgeon: [name], MD;  Location[facility];  Service: Urology;  Laterality: N/A;  . ESOPHAGEAL DILATION N/A 02/27/2016   Procedure: ESOPHAGEAL DILATION;  Surgeon: [name], MD;  [facility];  Service: Endoscopy;  Laterality: N/A;  . ESOPHAGEAL DILATION N/A 07/07/2018   Procedure: ESOPHAGEAL DILATION;  Surgeon: [name], MD;  [facility];  Service: Endoscopy;  Laterality: N/A;  . ESOPHAGOGASTRODUODENOSCOPY N/A 02/27/2016   Procedure: ESOPHAGOGASTRODUODENOSCOPY (EGD);  Surgeon: [name], MD;  [facility];  Service: Endoscopy;  Laterality: N/A;  12:00  . ESOPHAGOGASTRODUODENOSCOPY N/A 07/07/2018   Procedure: ESOPHAGOGASTRODUODENOSCOPY (EGD);  Surgeon: [name], MD;  [facility];  Service: Endoscopy;  Laterality: N/A;  . INCISION AND DRAINAGE PERIRECTAL ABSCESS    . LOWER EXTREMITY ANGIOGRAPHY N/A 06/25/2017   Procedure: LOWER EXTREMITY ANGIOGRAPHY;  Surgeon: [name], MD;  Location: [facility];  Service: Cardiovascular;  Laterality: N/A;  . LYMPH NODE DISSECTION Left    '05-neck  . PERIPHERAL VASCULAR ATHERECTOMY Right 09/20/2019   Procedure: PERIPHERAL VASCULAR ATHERECTOMY;  Surgeon: Marty Heck, MD;  Location: [facility];  Service: Cardiovascular;  Laterality: Right;  Posterior tibial  . PORT-A-CATH REMOVAL    . PORTACATH PLACEMENT  insertion and removal -last chemotherapy 10 yrs ago  . TRANSURETHRAL RESECTION OF PROSTATE N/A 08/10/2013   Procedure: TRANSURETHRAL RESECTION OF THE PROSTATE WITH GYRUS INSTRUMENTS;  Surgeon: [name], MD;  [facility];  Service: Urology;  Laterality: N/A;    Allergies  Allergen Reactions  . Tape Other (See Comments)    SKIN IS VERY THIN AND WILL TEAR AND BRUISE VERY EASILY!!!!  . Augmentin [Amoxicillin-Pot Clavulanate] Rash and Other (See Comments)    Redness of skin    Outpatient Encounter Medications as of 04/16/2020  Medication Sig  .  abiraterone acetate (ZYTIGA) 250 MG tablet Take 1 tablet (250 mg total) by mouth daily before breakfast. Take on an empty stomach 1 hour before or 2 hours after a meal  . acetaminophen (TYLENOL) 500 MG tablet Take 500 mg by mouth at bedtime.  Marland Kitchen apixaban (ELIQUIS) 5 MG TABS tablet Take 1 tablet (5 mg total) by mouth 2 (two) times daily.  . Aspirin 81 MG CAPS Take 81 mg by mouth daily.   . blood glucose meter kit and supplies Dispense based on patient and insurance preference. Use four times daily as directed. (FOR ICD-10 E10.9, E11.9).  . cephALEXin (KEFLEX) 250 MG capsule Take 250 mg by mouth daily at 12 noon. Preventative for UTI prescribed by urology  . Cholecalciferol (VITAMIN D3) 1000 units CAPS Take 1,000 Units by mouth daily.   . Cranberry 500 MG TABS Take 500 mg by mouth daily.   Marland Kitchen denosumab (XGEVA) 120 MG/1.7ML SOLN injection Inject 120 mg into the skin every 30 (thirty) days.  . diclofenac Sodium (VOLTAREN) 1 % GEL Apply 2 g topically 4 (four) times daily.  Marland Kitchen diltiazem (CARDIZEM SR) 90 MG 12 hr capsule Take 1 capsule (90 mg total) by mouth 2 (two) times daily.  Marland Kitchen donepezil (ARICEPT) 10 MG tablet Take 1 tablet (10 mg total) by mouth daily.  Marland Kitchen doxycycline (VIBRA-TABS) 100 MG tablet Take 1 tablet (100 mg total) by mouth 2 (two) times daily.  Marland Kitchen glipiZIDE (GLUCOTROL XL) 5 MG 24 hr tablet Take 1 tablet (5 mg total) by mouth daily with breakfast.  . glucose blood (EASYMAX TEST) test strip Use as instructed  . Lancets 28G MISC 1 kit by Does not apply route every morning.  Marland Kitchen Leuprolide Acetate, 6 Month, (LUPRON DEPOT, 19-MONTH,) 45 MG injection Inject 45 mg into the muscle every 6 (six) months.  . metFORMIN (GLUCOPHAGE) 850 MG tablet Take 850 mg by mouth 2 (two) times daily with a meal.   . mirtazapine (REMERON) 15 MG tablet Take 1 tablet (15 mg total) by mouth at bedtime.  . Multiple Vitamin (MULTIVITAMIN WITH MINERALS) TABS tablet Take 1 tablet by mouth daily.   . predniSONE (DELTASONE) 5 MG  tablet Take 2.5 mg by mouth 2 (two) times daily.   . Probiotic Product (PROBIOTIC DAILY PO) Take 1 tablet by mouth daily. Every morning   . saccharomyces boulardii (FLORASTOR) 250 MG capsule Take 1 capsule (250 mg total) by mouth 2 (two) times daily for 10 days.  . simethicone (MYLICON) 80 MG chewable tablet Chew 80 mg by mouth daily.  . traMADol (ULTRAM) 50 MG tablet Take 1 tablet (50 mg total) by mouth every 6 (six) hours as needed for moderate pain.  Marland Kitchen lisinopril (ZESTRIL) 20 MG tablet Take 1 tablet (20 mg total) by mouth in the morning and at bedtime.   No facility-administered encounter medications on file as of 04/16/2020.    Review of Systems  Constitutional:  Negative for appetite change, chills, fatigue and fever.  HENT: Positive for rhinorrhea and sore throat. Negative for congestion, sinus pressure, sinus pain and sneezing.   Respiratory: Positive for cough and shortness of breath. Negative for chest tightness and wheezing.   Cardiovascular: Negative for chest pain, palpitations and leg swelling.  Gastrointestinal: Negative for abdominal distention, abdominal pain, constipation, nausea and rectal pain.       Chronic loose stool  Musculoskeletal: Positive for arthralgias and gait problem.  Skin: Positive for wound. Negative for color change, pallor and rash.  Neurological: Negative for dizziness, speech difficulty, light-headedness and headaches.    Immunization History  Administered Date(s) Administered  . Influenza Whole 01/23/2015  . Influenza, High Dose Seasonal PF 02/02/2018  . Influenza-Unspecified 01/22/2014  . PFIZER SARS-COV-2 Vaccination 05/07/2019, 06/07/2019  . Pneumococcal-Unspecified 01/22/2014  . Tdap 09/24/2016   Pertinent  Health Maintenance Due  Topic Date Due  . FOOT EXAM  Never done  . OPHTHALMOLOGY EXAM  Never done  . URINE MICROALBUMIN  Never done  . PNA vac Low Risk Adult (2 of 2 - PCV13) 01/23/2015  . HEMOGLOBIN A1C  03/08/2020   Fall Risk   04/16/2020 04/15/2020 11/27/2019 10/06/2019 08/25/2019  Falls in the past year? 1 1 1 1 1   Number falls in past yr: 1 1 1 1 1   Injury with Fall? 0 0 0 0 0  Risk Factor Category  - - - - -  Risk for fall due to : - - History of fall(s) History of fall(s);Impaired balance/gait History of fall(s)  Follow up - - - - -   Functional Status Survey:    Vitals:   04/16/20 1106  BP: 120/84  Pulse: 68  Resp: 16  Temp: (!) 97.3 F (36.3 C)  SpO2: 91%  Height: 5' 10"  (1.778 m)   Body mass index is 20.09 kg/m. Physical Exam Vitals reviewed.  Constitutional:      General: He is not in acute distress.    Appearance: He is normal weight. He is not ill-appearing.  HENT:     Head: Normocephalic.     Nose: Nose normal. No congestion or rhinorrhea.     Mouth/Throat:     Mouth: Mucous membranes are moist.     Pharynx: Oropharynx is clear. No oropharyngeal exudate or posterior oropharyngeal erythema.  Eyes:     General: No scleral icterus.       Right eye: No discharge.        Left eye: No discharge.     Conjunctiva/sclera: Conjunctivae normal.     Pupils: Pupils are equal, round, and reactive to light.  Cardiovascular:     Rate and Rhythm: Normal rate and regular rhythm.     Pulses: Normal pulses.     Heart sounds: Normal heart sounds. No murmur heard. No friction rub. No gallop.   Pulmonary:     Effort: Pulmonary effort is normal. No respiratory distress.     Breath sounds: Rales present. No wheezing or rhonchi.  Chest:     Chest wall: No tenderness.  Abdominal:     General: Bowel sounds are normal. There is no distension.     Palpations: Abdomen is soft. There is no mass.     Tenderness: There is no abdominal tenderness. There is no right CVA tenderness, left CVA tenderness, guarding or rebound.  Musculoskeletal:        General: No swelling or tenderness.     Right lower leg: Edema present.  Left lower leg: Edema present.     Comments: On wheelchair today.Unsteady gait requires  assistance with transfer.daughter help him upright for provider to examine sacral area during visit.  Bilateral lower extremities trace -1+ edema.   Skin:    General: Skin is warm.     Coloration: Skin is not pale.     Findings: No bruising, erythema or rash.     Comments: Sacral stage 1 Pressure Ulcer wound bed covered with white protective ointment large surrounding skin tissue on gluteal area skin peeling non-tender to touch.   Neurological:     Mental Status: He is alert. Mental status is at baseline.     Cranial Nerves: No cranial nerve deficit.     Gait: Gait abnormal.  Psychiatric:        Mood and Affect: Mood is anxious.        Speech: Speech normal.        Behavior: Behavior normal.     Labs reviewed: Recent Labs    09/22/19 0327 09/23/19 0313 11/01/19 1239 01/26/20 1059 02/22/20 0956 03/25/20 1533  NA 140  --    < > 136 135 136  K 4.9  --    < > 4.5 4.7 4.4  CL 111  --    < > 99 103 102  CO2 21*  --    < > 28 25 26   GLUCOSE 182*  --    < > 186* 96 167*  BUN 17  --    < > 18 23 19   CREATININE 1.21  --    < > 0.89 1.18 1.01  CALCIUM 9.1  --    < > 10.0 10.4* 9.2  MG 1.0* 1.9  --   --   --   --    < > = values in this interval not displayed.   Recent Labs    01/26/20 1059 02/22/20 0956 03/25/20 1533  AST 16 14* 12*  ALT 16 12 8   ALKPHOS 69 69 75  BILITOT 0.8 0.7 0.3  PROT 6.8 6.3* 6.2*  ALBUMIN 3.6 3.2* 2.9*   Recent Labs    12/28/19 1100 01/26/20 1059 02/22/20 0956  WBC 8.1 8.5 8.3  NEUTROABS 4.8 4.8 3.7  HGB 11.4* 11.1* 9.5*  HCT 35.0* 33.9* 29.2*  MCV 91.1 90.4 93.6  PLT 178 179 268   Lab Results  Component Value Date   TSH 3.749 04/17/2018   Lab Results  Component Value Date   HGBA1C 5.1 09/06/2019   Lab Results  Component Value Date   CHOL 147 01/31/2018   HDL 50 01/31/2018   LDLCALC 75 01/31/2018   TRIG 109 01/31/2018   CHOLHDL 2.9 01/31/2018    Significant Diagnostic Results in last 30 days:  No results  found.  Assessment/Plan 1. Upper respiratory tract infection, unspecified type Afebrile.clear rhinorrhea noted during visit.  - SARS-COV-2 RNA,(COVID-19) QUAL NAAT - POC Influenza A/B negative  - POC Rapid Strep A negative   2. Cough Afebrile.bilateral lung rales noted on Auscultation.Highly suspicious for pneumonia.will obtain imaging to rule out other acute abnormalities. Start on doxycycline 100 mg tablet twice daily x 10 days.Advised to take along with probiotics to prevent ABT associated diarrhea.  - DG Chest 2 View; Future - doxycycline (VIBRA-TABS) 100 MG tablet; Take 1 tablet (100 mg total) by mouth 2 (two) times daily.  Dispense: 20 tablet; Refill: 0 - saccharomyces boulardii (FLORASTOR) 250 MG capsule; Take 1 capsule (250 mg total) by mouth 2 (two)  times daily for 10 days.  Dispense: 20 capsule; Refill: 0 - continue with incentive spirometer change frequency to every 6 hours  while awake. - Advised to get chest X-ray done at [facility] then will call you with results.   3. Pressure injury of sacral region, stage 1 Afebrile.No signs of infection. Wound bed covered with white cream surrounding skin tissue peeling extensive areas involving both gluteal areas.continue with protective skin ointment. Dressing change orders faxed to [facility] by [facility] today.  - For home use only DME Other see comment: Home health Nurse to cleanse sacral/Gluteal area open area with saline,pat dry,apply foam dressing for protection.change dressing every 3 days and as needed if soiled. Notify for any signs of infection.  Family/ staff Communication: Reviewed plan of care with patient and daughter.   Labs/tests ordered: - DG Chest 2 View; Future  Next Appointment: As needed if symptoms worsen or fail to improve.  Sandrea Hughs, NP

## 2020-04-16 NOTE — Progress Notes (Signed)
This encounter was created in error - please disregard.

## 2020-04-16 NOTE — Patient Instructions (Addendum)
-   Home health Nurse to cleanse sacral/Gluteal area open area with saline,pat dry,apply foam dressing for protection.change dressing every 3 days and as needed if soiled. Notify for any signs of infection.  - Please get chest X-ray done at [facility] then will call you with results.

## 2020-04-18 ENCOUNTER — Telehealth: Payer: Self-pay | Admitting: *Deleted

## 2020-04-18 LAB — SARS-COV-2 RNA,(COVID-19) QUALITATIVE NAAT: SARS CoV2 RNA: NOT DETECTED

## 2020-04-18 NOTE — Telephone Encounter (Signed)
Cassandra with [facility] called requesting verbal orders for PT 1x1, 1x6, Prn, Social and OT eval and Tx.  Verbal orders given.

## 2020-04-22 ENCOUNTER — Inpatient Hospital Stay (HOSPITAL_COMMUNITY): Payer: Medicare Other

## 2020-04-22 ENCOUNTER — Ambulatory Visit (HOSPITAL_COMMUNITY): Payer: Medicare Other | Admitting: Hematology

## 2020-04-22 ENCOUNTER — Ambulatory Visit (HOSPITAL_COMMUNITY): Payer: Medicare Other

## 2020-04-24 ENCOUNTER — Telehealth: Payer: Self-pay | Admitting: *Deleted

## 2020-04-24 NOTE — Telephone Encounter (Signed)
Please complete the two days of antibiotics then schedule for an office visit to re-evaluate to rule out fluid in the lungs verse pneumonia prior to extending antibiotics.

## 2020-04-24 NOTE — Telephone Encounter (Signed)
Patient daughter, [name] and stated that you saw patient on 04/16/2020 and gave him Doxycyline.  Stated that patient is much better but Home Health saw him yesterday and stated that his lungs are not fully clear yet.   Daughter stated that he only has 2 days left of the antibiotic and she is wanting the antibiotic to be extended a couple more days. Home Health will be back out on Friday.   Wanting Antibiotic to be extended.  Please Advise.

## 2020-04-24 NOTE — Telephone Encounter (Signed)
Daughter notified and agreed. Stated she will call back to schedule an appointment. Stated that she is in [facility] and patient's caregiver is sick, she is going to call family and see if someone can bring him.  Awaiting callback.

## 2020-04-25 ENCOUNTER — Encounter (HOSPITAL_BASED_OUTPATIENT_CLINIC_OR_DEPARTMENT_OTHER): Payer: Medicare Other | Admitting: Internal Medicine

## 2020-04-26 ENCOUNTER — Encounter: Payer: Self-pay | Admitting: Family

## 2020-04-26 ENCOUNTER — Ambulatory Visit (INDEPENDENT_AMBULATORY_CARE_PROVIDER_SITE_OTHER): Payer: Medicare Other | Admitting: Family

## 2020-04-26 ENCOUNTER — Other Ambulatory Visit: Payer: Self-pay

## 2020-04-26 VITALS — BP 120/70 | Temp 96.6°F | Resp 16 | Ht 70.0 in | Wt 127.4 lb

## 2020-04-26 DIAGNOSIS — M79605 Pain in left leg: Secondary | ICD-10-CM | POA: Diagnosis not present

## 2020-04-26 DIAGNOSIS — M79604 Pain in right leg: Secondary | ICD-10-CM

## 2020-04-26 DIAGNOSIS — J189 Pneumonia, unspecified organism: Secondary | ICD-10-CM | POA: Diagnosis not present

## 2020-04-26 DIAGNOSIS — J90 Pleural effusion, not elsewhere classified: Secondary | ICD-10-CM | POA: Diagnosis not present

## 2020-04-26 DIAGNOSIS — R630 Anorexia: Secondary | ICD-10-CM | POA: Diagnosis not present

## 2020-04-26 MED ORDER — LEVOFLOXACIN 500 MG PO TABS: 500.0000 mg | ORAL_TABLET | Freq: Every day | ORAL | 0 refills | Status: AC

## 2020-04-26 NOTE — Patient Instructions (Addendum)
-   Take extra strength Tylenol 500 mg tablet three times daily for pain  - Take Furosemide 20 mg tablet one by mouth daily x 5 days then resume Furosemide 10 mg tablet daily as needed for shortness of breath or cough.  - Take Levaquin 500 mg tablet one by mouth daily x 7 days

## 2020-04-26 NOTE — Progress Notes (Signed)
Provider: Lawrie Tunks FNP-C  [name], NP  Patient Care Team: [name], NP as PCP - General (Geriatric Medicine) [name], MD as PCP - Cardiology (Cardiology) [name], MD as Consulting Physician (Urology) [name], MD (Inactive) as Consulting Physician (Hematology and Oncology) [name], MD as Consulting Physician (Orthopedic Surgery) [name], DPM as Consulting Physician (Podiatry) [name], MD as Consulting Physician (Hematology) [name], MD (Dermatology) [name], MD as Consulting Physician (Gastroenterology) [name], MD as Consulting Physician (Vascular Surgery) Sater, [name], MD (Neurology) Clinic, [name], [facility] (Ophthalmology)  Extended Emergency Contact Information Primary Emergency Contact: Courtney Heys, [place] Johnnette Litter of [place] Phone: [phone] Mobile Phone: [phone] Relation: Daughter Secondary Emergency Contact: Marsh,Valerie Address: [address]          [place], [place] [address] [place] Phone: [phone] Relation: Daughter  Code Status:  DNR Goals of care: Advanced Directive information Advanced Directives 04/26/2020  Does Patient Have a Medical Advance Directive? Yes  Type of Paramedic of [place];Living will;Out of facility DNR (pink MOST or yellow form)  Does patient want to make changes to medical advance directive? No - Patient declined  Copy of [place] in Chart? Yes - validated most recent copy scanned in chart (See row information)  Would patient like information on creating a medical advance directive? -  Pre-existing out of facility DNR order (yellow form or pink MOST form) Yellow form placed in chart (order not valid for inpatient use)     Chief Complaint  Patient presents with  . Follow-up    URI Follow Up.     HPI:  Pt is a 87 y.o. male seen today for an acute visit for follow up cough.He was here 04/16/2020 for cough.chest X-ray ordered showed new right sided pleural effusion with lung base pneumonia and para pneumonic effusion.He was advised to take Furosemide x 5 days then resume as needed.His pneumonia was treated with 10 day course of Doxycycline.He is here today with daughter.States home health stated still has fluid in the lungs. No shortness of breath reported.Has non-productive cough. No fever or chills reported.  Daughter states patient not eating well though appetite was better yesterday and had some eggs this morning. Has chronic ongoing  problems with swallowing.daughter states does not want feeding tube.No MOST form in place.Daughter in charge of medical issues not here today.    Past Medical History:  Diagnosis Date  . Acute bronchitis   . Acute lower UTI 11/25/2017  . Amputation of toe of left foot ([place]) 07/30/2017, 08/27/2017    Great toe 07/30/2017 Second toe 08/27/2017   . Arthritis   . Aspiration pneumonia ([place]) 01/31/2018  . Atrial fibrillation ([place])    Dr. Harl Bowie- [place] follows saw 11'14  . Bladder stones    tx. with oral meds and antibiotics, now surgery planned  . Bone metastases ([name]) 08/21/2015  . Cataracts, both eyes    surgery planned May 2015  . Community acquired pneumonia 04/16/2018  . Cystoid macular edema 04/21/2015    Right eye 04/21/2015   . Dehydration 04/16/2018  . Dementia without behavioral disturbance ([place]) 04/16/2018  . Diabetes mellitus without complication ([place])    Type II  . Diarrhea 11/25/2017  . Dyspnea    with activity  . Dysrhythmia    Afib  . Family history of breast  cancer   . Family history of colon cancer   . Genetic testing 02/09/2017  . GERD (gastroesophageal reflux disease)   . 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 Margot Chimes, thus far without recurrence.   Marland Kitchen History of MRI of lumbar spine 03/05/2014  . Hypertension   . Hypertrophy of prostate with urinary obstruction   . Hypomagnesemia 03/16/2017  . Metabolic encephalopathy 70/96/2836  . Metastatic adenocarcinoma to prostate ([place]) 09/08/2013  . 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 NHL (non-Hodgkin's lymphoma) ([place]) 2005   Diffuse large B-cell lymphoma, clinically stage IIIA, CD20 positive, status post cervical lymph node biopsy 08/03/2003 on the left. PET scan was also positive in the spleen and small bowel region, but bone marrow aspiration and biopsy were negative. So he essentially had stage IIIAs. He received R-CHOP x6 cycles with CR established by PET scan criteria on 11/23/2003 with no evidence for relapse th  . Numbness 02/19/2017   Per [facility] new patient packet  . Osteomyelitis ([place]) 06/29/2017   toe of left foot   . PAD (peripheral artery disease) ([place]) 08/27/2017  . Polyneuropathy 02/19/2017  . Postural dizziness with presyncope 02/19/2017  . S/P TURP 08/10/2013  . Sepsis ([place]) 07/02/2018  . Skin cancer    Basal cell- face,head, neck,  arms, legs, Back  . Urinary frequency 02/19/2017   Past Surgical History:  Procedure Laterality Date  . ABDOMINAL AORTOGRAM W/LOWER EXTREMITY N/A 09/20/2019   Procedure: ABDOMINAL AORTOGRAM W/LOWER EXTREMITY;  Surgeon: Marty Heck, MD;  Location: [facility];  Service: Cardiovascular;  Laterality: N/A;  . AMPUTATION Left 06/29/2017   Procedure: AMPUTATION LEFT GREAT TOE;  Surgeon: [name], MD;  Location: [facility];  Service: Vascular;  Laterality: Left;  . AMPUTATION Left 08/27/2017   Procedure: AMPUTATION Left SECOND TOE;  Surgeon: [name], MD;  Location: [facility];  Service: Vascular;  Laterality: Left;  . CATARACT EXTRACTION Bilateral  2015  . CHOLECYSTECTOMY    . COLON RESECTION     small bowel  . CYSTOSCOPY WITH LITHOLAPAXY N/A 08/10/2013   Procedure: CYSTOSCOPY WITH LITHOLAPAXY WITH [name];  Surgeon: [name], MD;  Location: [facility];  Service: Urology;  Laterality: N/A;  . ESOPHAGEAL DILATION N/A 02/27/2016   Procedure: ESOPHAGEAL DILATION;  Surgeon: [name], MD;  [facility];  Service: Endoscopy;  Laterality: N/A;  . ESOPHAGEAL DILATION N/A 07/07/2018   Procedure: ESOPHAGEAL DILATION;  Surgeon: [name], MD;  [facility];  Service: Endoscopy;  Laterality: N/A;  . ESOPHAGOGASTRODUODENOSCOPY N/A 02/27/2016   Procedure: ESOPHAGOGASTRODUODENOSCOPY (EGD);  Surgeon: [name], MD;  [facility];  Service: Endoscopy;  Laterality: N/A;  12:00  . ESOPHAGOGASTRODUODENOSCOPY N/A 07/07/2018   Procedure: ESOPHAGOGASTRODUODENOSCOPY (EGD);  Surgeon: [name], MD;  [facility];  Service: Endoscopy;  Laterality: N/A;  . INCISION AND DRAINAGE PERIRECTAL ABSCESS    . LOWER EXTREMITY ANGIOGRAPHY N/A 06/25/2017   Procedure: LOWER EXTREMITY ANGIOGRAPHY;  Surgeon: [name], MD;  Location: [facility];  Service: Cardiovascular;  Laterality: N/A;  . LYMPH NODE DISSECTION Left    '05-neck  . PERIPHERAL VASCULAR ATHERECTOMY Right 09/20/2019   Procedure: PERIPHERAL VASCULAR ATHERECTOMY;  Surgeon: Marty Heck, MD;  Location: [facility];  Service: Cardiovascular;  Laterality: Right;  Posterior tibial  . PORT-A-CATH REMOVAL    . PORTACATH PLACEMENT     insertion and removal -last chemotherapy 10 yrs ago  . TRANSURETHRAL RESECTION OF PROSTATE N/A 08/10/2013   Procedure: TRANSURETHRAL RESECTION OF THE PROSTATE WITH GYRUS INSTRUMENTS;  Surgeon: [name], MD;  [facility];  Service: Urology;  Laterality: N/A;    Allergies  Allergen Reactions  . Tape Other (See Comments)    SKIN IS VERY THIN AND WILL TEAR AND BRUISE VERY  EASILY!!!!  . Augmentin [Amoxicillin-Pot Clavulanate] Rash and Other (See Comments)    Redness of skin    Outpatient Encounter Medications as of 04/26/2020  Medication Sig  . abiraterone acetate (ZYTIGA) 250 MG tablet Take 1 tablet (250 mg total) by mouth daily before breakfast. Take on an empty stomach 1 hour before or 2 hours after a meal  . acetaminophen (TYLENOL) 500 MG tablet Take 500 mg by mouth at bedtime.  Marland Kitchen albuterol (2.5 MG/3ML) 0.083% NEBU 3 mL, albuterol (5 MG/ML) 0.5% NEBU 0.5 mL Inhale into the lungs 3 (three) times daily as needed.  Marland Kitchen apixaban (ELIQUIS) 5 MG TABS tablet Take 1 tablet (5 mg total) by mouth 2 (two) times daily.  . Aspirin 81 MG CAPS Take 81 mg by mouth daily.   . blood glucose meter kit and supplies Dispense based on patient and insurance preference. Use four times daily as directed. (FOR ICD-10 E10.9, E11.9).  . cephALEXin (KEFLEX) 250 MG capsule Take 250 mg by mouth daily at 12 noon. Preventative for UTI prescribed by urology  . Cholecalciferol (VITAMIN D3) 1000 units CAPS Take 1,000 Units by mouth daily.   . Cranberry 500 MG TABS Take 500 mg by mouth daily.   Marland Kitchen denosumab (XGEVA) 120 MG/1.7ML SOLN injection Inject 120 mg into the skin every 30 (thirty) days.  . Dextromethorphan-guaiFENesin (MUCINEX DM PO) Take by mouth 2 (two) times daily.  . diclofenac Sodium (VOLTAREN) 1 % GEL Apply 2 g topically 4 (four) times daily.  Marland Kitchen diltiazem (CARDIZEM SR) 90 MG 12 hr capsule Take 1 capsule (90 mg total) by mouth 2 (two) times daily.  Marland Kitchen donepezil (ARICEPT) 10 MG tablet Take 1 tablet (10 mg total) by mouth daily.  Marland Kitchen doxycycline (VIBRA-TABS) 100 MG tablet Take 1 tablet (100 mg total) by mouth 2 (two) times daily.  . furosemide (LASIX) 20 MG tablet Take 0.5 tablets (10 mg total) by mouth daily. Take for 5 day, then change to as needed for shortness of breath for pleural effusion.  Marland Kitchen glipiZIDE (GLUCOTROL XL) 5 MG 24 hr tablet Take 1 tablet (5 mg total) by mouth daily with  breakfast.  . glucose blood (EASYMAX TEST) test strip Use as instructed  . Lancets 28G MISC 1 kit by Does not apply route every morning.  Marland Kitchen Leuprolide Acetate, 6 Month, (LUPRON DEPOT, 18-MONTH,) 45 MG injection Inject 45 mg into the muscle every 6 (six) months.  . metFORMIN (GLUCOPHAGE) 850 MG tablet Take 850 mg by mouth 2 (two) times daily with a meal.   . mirtazapine (REMERON) 15 MG tablet Take 1 tablet (15 mg total) by mouth at bedtime.  . Multiple Vitamin (MULTIVITAMIN WITH MINERALS) TABS tablet Take 1 tablet by mouth daily.   . predniSONE (DELTASONE) 5 MG tablet Take 2.5 mg by mouth 2 (two) times daily.   . Probiotic Product (PROBIOTIC DAILY PO) Take 1 tablet by mouth daily. Every morning   .  saccharomyces boulardii (FLORASTOR) 250 MG capsule Take 1 capsule (250 mg total) by mouth 2 (two) times daily for 10 days.  . simethicone (MYLICON) 80 MG chewable tablet Chew 80 mg by mouth daily.  . traMADol (ULTRAM) 50 MG tablet Take 1 tablet (50 mg total) by mouth every 6 (six) hours as needed for moderate pain.  Marland Kitchen lisinopril (ZESTRIL) 20 MG tablet Take 1 tablet (20 mg total) by mouth in the morning and at bedtime.   No facility-administered encounter medications on file as of 04/26/2020.    Review of Systems  Constitutional: Positive for fatigue. Negative for chills and fever.       Poor oral intake.Has had weight loss 11.6 lbs over three months   HENT: Positive for trouble swallowing. Negative for congestion, postnasal drip, rhinorrhea, sinus pressure, sinus pain, sneezing and sore throat.   Eyes: Negative for pain, discharge, redness and itching.  Respiratory: Positive for cough. Negative for chest tightness, shortness of breath and wheezing.   Cardiovascular: Negative for chest pain, palpitations and leg swelling.  Gastrointestinal: Negative for abdominal distention, abdominal pain, diarrhea, nausea and vomiting.  Musculoskeletal: Positive for arthralgias and gait problem. Negative for joint  swelling and myalgias.  Skin: Negative for color change, pallor and rash.  Neurological: Negative for dizziness, speech difficulty, weakness, light-headedness, numbness and headaches.  Psychiatric/Behavioral: Negative for agitation, behavioral problems, confusion and sleep disturbance. The patient is not nervous/anxious.     Immunization History  Administered Date(s) Administered  . Influenza Whole 01/23/2015  . Influenza, High Dose Seasonal PF 02/02/2018  . Influenza-Unspecified 01/22/2014  . PFIZER SARS-COV-2 Vaccination 05/07/2019, 06/07/2019  . Pneumococcal-Unspecified 01/22/2014  . Tdap 09/24/2016   Pertinent  Health Maintenance Due  Topic Date Due  . FOOT EXAM  Never done  . OPHTHALMOLOGY EXAM  Never done  . URINE MICROALBUMIN  Never done  . PNA vac Low Risk Adult (2 of 2 - PCV13) 01/23/2015  . HEMOGLOBIN A1C  03/08/2020   Fall Risk  04/26/2020 04/16/2020 04/15/2020 11/27/2019 10/06/2019  Falls in the past year? 0 1 1 1 1   Number falls in past yr: 0 1 1 1 1   Injury with Fall? 0 0 0 0 0  Risk Factor Category  - - - - -  Risk for fall due to : - - - History of fall(s) History of fall(s);Impaired balance/gait  Follow up - - - - -   Functional Status Survey:    Vitals:   04/26/20 1324  BP: 120/70  Resp: 16  Temp: (!) 96.6 F (35.9 C)  Weight: 127 lb 6.4 oz (57.8 kg)  Height: 5' 10"  (1.778 m)   Body mass index is 18.28 kg/m. Physical Exam Vitals reviewed.  Constitutional:      General: He is not in acute distress.    Appearance: He is underweight. He is not ill-appearing.  HENT:     Head: Normocephalic.  Cardiovascular:     Rate and Rhythm: Normal rate and regular rhythm.     Pulses: Normal pulses.     Heart sounds: Normal heart sounds. No murmur heard. No friction rub. No gallop.   Pulmonary:     Effort: Pulmonary effort is normal. No respiratory distress.     Breath sounds: Examination of the left-middle field reveals rales. Examination of the right-lower  field reveals decreased breath sounds. Examination of the left-lower field reveals decreased breath sounds. Decreased breath sounds and rales present. No wheezing or rhonchi.  Abdominal:     General:  Bowel sounds are normal. There is no distension.     Palpations: Abdomen is soft. There is no mass.     Tenderness: There is no abdominal tenderness. There is no right CVA tenderness, left CVA tenderness, guarding or rebound.  Musculoskeletal:        General: No swelling or tenderness.     Right lower leg: No edema.     Left lower leg: No edema.     Comments: Unsteady gait   Skin:    General: Skin is warm and dry.     Coloration: Skin is not pale.     Findings: No erythema or rash.  Neurological:     Mental Status: He is alert. Mental status is at baseline.     Gait: Gait abnormal.  Psychiatric:        Mood and Affect: Mood normal.        Behavior: Behavior normal.        Thought Content: Thought content normal.        Judgment: Judgment normal.     Labs reviewed: Recent Labs    09/22/19 0327 09/23/19 0313 11/01/19 1239 01/26/20 1059 02/22/20 0956 03/25/20 1533  NA 140  --    < > 136 135 136  K 4.9  --    < > 4.5 4.7 4.4  CL 111  --    < > 99 103 102  CO2 21*  --    < > 28 25 26   GLUCOSE 182*  --    < > 186* 96 167*  BUN 17  --    < > 18 23 19   CREATININE 1.21  --    < > 0.89 1.18 1.01  CALCIUM 9.1  --    < > 10.0 10.4* 9.2  MG 1.0* 1.9  --   --   --   --    < > = values in this interval not displayed.   Recent Labs    01/26/20 1059 02/22/20 0956 03/25/20 1533  AST 16 14* 12*  ALT 16 12 8   ALKPHOS 69 69 75  BILITOT 0.8 0.7 0.3  PROT 6.8 6.3* 6.2*  ALBUMIN 3.6 3.2* 2.9*   Recent Labs    12/28/19 1100 01/26/20 1059 02/22/20 0956  WBC 8.1 8.5 8.3  NEUTROABS 4.8 4.8 3.7  HGB 11.4* 11.1* 9.5*  HCT 35.0* 33.9* 29.2*  MCV 91.1 90.4 93.6  PLT 178 179 268   Lab Results  Component Value Date   TSH 3.749 04/17/2018   Lab Results  Component Value Date    HGBA1C 5.1 09/06/2019   Lab Results  Component Value Date   CHOL 147 01/31/2018   HDL 50 01/31/2018   LDLCALC 75 01/31/2018   TRIG 109 01/31/2018   CHOLHDL 2.9 01/31/2018    Significant Diagnostic Results in last 30 days:  DG Chest 2 View  Result Date: 04/16/2020 CLINICAL DATA:  87 year old male with cough EXAM: CHEST - 2 VIEW COMPARISON:  02/22/2020 FINDINGS: Cardiomediastinal silhouette unchanged in size and contour. New meniscus overlying the right hemithorax with blunting on the costophrenic sulcus lateral view. Reticular opacities at the lung base on the lateral view corresponding to findings on the frontal. No pneumothorax. Coarsened interstitial markings. No displaced fracture IMPRESSION: New right-sided pleural effusion with reticular opacities at the lung base, presumed to be pneumonia and parapneumonic effusion. Electronically Signed   By: Corrie Mckusick D.O.   On: 04/16/2020 15:25    Assessment/Plan 1. Community acquired  pneumonia of left lower lobe of lung Afebrile.Left mid lobe rales with very diminished bilateral bases.Has completed 10 days of doxycycline.will initiate Levaquin. Advised to continue on Probiotic while on levaquin.   - levofloxacin (LEVAQUIN) 500 MG tablet; Take 1 tablet (500 mg total) by mouth daily for 7 days.  Dispense: 7 tablet; Refill: 0  2. Pain in both lower extremities Reports pain on both legs.took tylenol this morning which was effective.  - Advised to Take extra strength Tylenol 500 mg tablet three times daily for pain   3. Decreased appetite - Encouraged fluid intake,soup,protein supplement. - encouraged small but frequent meals. - discuss MOST form on next visit due to ongoing difficulties with swallowing.   4. Pleural effusion - Take Furosemide 20 mg tablet one by mouth daily x 5 days then resume Furosemide 10 mg tablet daily as needed for shortness of breath or cough.   Family/ staff Communication: Reviewed plan of care with patient and  daughter verbalized understanding.   Labs/tests ordered: None   Next Appointment: As needed if symptoms worsen or fail to improve.   Sandrea Hughs, NP

## 2020-04-29 ENCOUNTER — Ambulatory Visit: Payer: Medicare Other | Admitting: Cardiology

## 2020-04-30 ENCOUNTER — Ambulatory Visit: Payer: Medicare Other | Admitting: Urology

## 2020-04-30 ENCOUNTER — Encounter (HOSPITAL_COMMUNITY): Payer: Self-pay

## 2020-05-01 ENCOUNTER — Other Ambulatory Visit (HOSPITAL_COMMUNITY): Payer: Self-pay | Admitting: *Deleted

## 2020-05-01 MED ORDER — ABIRATERONE ACETATE 250 MG PO TABS: 250.0000 mg | ORAL_TABLET | Freq: Every day | ORAL | 0 refills | Status: DC

## 2020-05-01 NOTE — Telephone Encounter (Signed)
Chart reviewed, okay to refill zytiga

## 2020-05-02 ENCOUNTER — Encounter (HOSPITAL_BASED_OUTPATIENT_CLINIC_OR_DEPARTMENT_OTHER): Payer: Medicare Other | Attending: Internal Medicine | Admitting: Internal Medicine

## 2020-05-02 ENCOUNTER — Other Ambulatory Visit: Payer: Self-pay

## 2020-05-02 DIAGNOSIS — Z89412 Acquired absence of left great toe: Secondary | ICD-10-CM | POA: Diagnosis not present

## 2020-05-02 DIAGNOSIS — E1142 Type 2 diabetes mellitus with diabetic polyneuropathy: Secondary | ICD-10-CM | POA: Insufficient documentation

## 2020-05-02 DIAGNOSIS — Z8572 Personal history of non-Hodgkin lymphomas: Secondary | ICD-10-CM | POA: Insufficient documentation

## 2020-05-02 DIAGNOSIS — L97412 Non-pressure chronic ulcer of right heel and midfoot with fat layer exposed: Secondary | ICD-10-CM | POA: Insufficient documentation

## 2020-05-02 DIAGNOSIS — E11621 Type 2 diabetes mellitus with foot ulcer: Secondary | ICD-10-CM | POA: Diagnosis not present

## 2020-05-02 DIAGNOSIS — Z89422 Acquired absence of other left toe(s): Secondary | ICD-10-CM | POA: Diagnosis not present

## 2020-05-02 DIAGNOSIS — L97521 Non-pressure chronic ulcer of other part of left foot limited to breakdown of skin: Secondary | ICD-10-CM | POA: Insufficient documentation

## 2020-05-02 DIAGNOSIS — I10 Essential (primary) hypertension: Secondary | ICD-10-CM | POA: Diagnosis not present

## 2020-05-02 DIAGNOSIS — I4891 Unspecified atrial fibrillation: Secondary | ICD-10-CM | POA: Diagnosis not present

## 2020-05-02 DIAGNOSIS — L89890 Pressure ulcer of other site, unstageable: Secondary | ICD-10-CM | POA: Diagnosis not present

## 2020-05-02 DIAGNOSIS — E1151 Type 2 diabetes mellitus with diabetic peripheral angiopathy without gangrene: Secondary | ICD-10-CM | POA: Diagnosis not present

## 2020-05-02 DIAGNOSIS — Z8546 Personal history of malignant neoplasm of prostate: Secondary | ICD-10-CM | POA: Diagnosis not present

## 2020-05-02 DIAGNOSIS — L97514 Non-pressure chronic ulcer of other part of right foot with necrosis of bone: Secondary | ICD-10-CM | POA: Diagnosis not present

## 2020-05-02 DIAGNOSIS — Z85830 Personal history of malignant neoplasm of bone: Secondary | ICD-10-CM | POA: Diagnosis not present

## 2020-05-02 DIAGNOSIS — L97512 Non-pressure chronic ulcer of other part of right foot with fat layer exposed: Secondary | ICD-10-CM | POA: Diagnosis not present

## 2020-05-02 NOTE — Progress Notes (Signed)
[name], Foster ([id number]) Visit Report for 05/02/2020 Arrival Information Details Patient Name: Date of Service: [name] E. 05/02/2020 1:00 PM Medical Record Number: [id number] Patient Account Number: [id number] Date of Birth/Sex: Treating RN: 11-13-1923 (87 y.o. [name] Primary Care Valicia Rief: Sherrie Mustache Other Clinician: Referring Nabeel Gladson: Treating Shakyra Mattera/Extender: Deloria Lair in Treatment: 60 Visit Information History Since Last Visit Added or deleted any medications: No Patient Arrived: Wheel Chair Any new allergies or adverse reactions: No Arrival Time: 13:08 Had a fall or experienced change in No Accompanied By: daughter/caregiver activities of daily living that may affect Transfer Assistance: None risk of falls: Patient Identification Verified: Yes Signs or symptoms of abuse/neglect since last visito No Secondary Verification Process Completed: Yes Hospitalized since last visit: No Patient Requires Transmission-Based Precautions: No Implantable device outside of the clinic excluding No Patient Has Alerts: Yes cellular tissue based products placed in the center Patient Alerts: R ABI non compressible since last visit: Has Dressing in Place as Prescribed: Yes Pain Present Now: No Electronic Signature(s) Signed: 05/02/2020 5:28:50 PM By: Levan Hurst RN, BSN Entered By: Levan Hurst on 05/02/2020 13:09:24 -------------------------------------------------------------------------------- Clinic Level of Care Assessment Details Patient Name: Date of Service: [place] [name] Barbara NK E. 05/02/2020 1:00 PM Medical Record Number: [id number] Patient Account Number: [id number] Date of Birth/Sex: Treating RN: Dec 08, 1923 (87 y.o. [name] Primary Care [name]: Sherrie Mustache Other Clinician: Referring [name]: Treating [name]/Extender: Deloria Lair in Treatment: 85 Clinic Level of Care  Assessment Items TOOL 4 Quantity Score X- 1 0 Use when only an EandM is performed on FOLLOW-UP visit ASSESSMENTS - Nursing Assessment / Reassessment X- 1 10 Reassessment of Co-morbidities (includes updates in patient status) X- 1 5 Reassessment of Adherence to Treatment Plan ASSESSMENTS - Wound and Skin A ssessment / Reassessment []  - 0 Simple Wound Assessment / Reassessment - one wound X- 3 5 Complex Wound Assessment / Reassessment - multiple wounds X- 1 10 Dermatologic / Skin Assessment (not related to wound area) ASSESSMENTS - Focused Assessment X- 1 5 Circumferential Edema Measurements - multi extremities X- 1 10 Nutritional Assessment / Counseling / Intervention []  - 0 Lower Extremity Assessment (monofilament, tuning fork, pulses) []  - 0 Peripheral Arterial Disease Assessment (using hand held doppler) ASSESSMENTS - Ostomy and/or Continence Assessment and Care []  - 0 Incontinence Assessment and Management []  - 0 Ostomy Care Assessment and Management (repouching, etc.) PROCESS - Coordination of Care []  - 0 Simple Patient / Family Education for ongoing care X- 1 20 Complex (extensive) Patient / Family Education for ongoing care X- 1 10 Staff obtains Programmer, systems, Records, T Results / Process Orders est X- 1 10 Staff telephones HHA, Nursing Homes / Clarify orders / etc []  - 0 Routine Transfer to another Facility (non-emergent condition) []  - 0 Routine Hospital Admission (non-emergent condition) []  - 0 New Admissions / Biomedical engineer / Ordering NPWT Apligraf, etc. , []  - 0 Emergency Hospital Admission (emergent condition) []  - 0 Simple Discharge Coordination X- 1 15 Complex (extensive) Discharge Coordination PROCESS - Special Needs []  - 0 Pediatric / Minor Patient Management []  - 0 Isolation Patient Management []  - 0 Hearing / Language / Visual special needs []  - 0 Assessment of Community assistance (transportation, D/C planning, etc.) []  -  0 Additional assistance / Altered mentation []  - 0 Support Surface(s) Assessment (bed, cushion, seat, etc.) INTERVENTIONS - Wound Cleansing / Measurement []  - 0 Simple Wound Cleansing - one wound X- 3 5  Complex Wound Cleansing - multiple wounds X- 1 5 Wound Imaging (photographs - any number of wounds) []  - 0 Wound Tracing (instead of photographs) []  - 0 Simple Wound Measurement - one wound X- 3 5 Complex Wound Measurement - multiple wounds INTERVENTIONS - Wound Dressings X - Small Wound Dressing one or multiple wounds 2 10 X- 1 15 Medium Wound Dressing one or multiple wounds []  - 0 Large Wound Dressing one or multiple wounds []  - 0 Application of Medications - topical []  - 0 Application of Medications - injection INTERVENTIONS - Miscellaneous []  - 0 External ear exam []  - 0 Specimen Collection (cultures, biopsies, blood, body fluids, etc.) []  - 0 Specimen(s) / Culture(s) sent or taken to Lab for analysis []  - 0 Patient Transfer (multiple staff / Civil Service fast streamer / Similar devices) []  - 0 Simple Staple / Suture removal (25 or less) []  - 0 Complex Staple / Suture removal (26 or more) []  - 0 Hypo / Hyperglycemic Management (close monitor of Blood Glucose) []  - 0 Ankle / Brachial Index (ABI) - do not check if billed separately X- 1 5 Vital Signs Has the patient been seen at the hospital within the last three years: Yes Total Score: 185 Level Of Care: New/Established - Level 5 Electronic Signature(s) Signed: 05/02/2020 5:39:22 PM By: Deon Pilling Entered By: Deon Pilling on 05/02/2020 13:39:45 -------------------------------------------------------------------------------- Encounter Discharge Information Details Patient Name: Date of Service: [place] [name] Barbara NK E. 05/02/2020 1:00 PM Medical Record Number: [id number] Patient Account Number: [id number] Date of Birth/Sex: Treating RN: 1923/11/07 (87 y.o. [name] Primary Care Kimon Loewen: Sherrie Mustache Other  Clinician: Referring Dalicia Kisner: Treating Karon Heckendorn/Extender: Deloria Lair in Treatment: 20 Encounter Discharge Information Items Discharge Condition: Stable Ambulatory Status: Wheelchair Discharge Destination: Home Transportation: Private Auto Accompanied By: caregiver Schedule Follow-up Appointment: Yes Clinical Summary of Care: Patient Declined Electronic Signature(s) Signed: 05/02/2020 5:40:31 PM By: Baruch Gouty RN, BSN Entered By: Baruch Gouty on 05/02/2020 14:18:22 -------------------------------------------------------------------------------- Lower Extremity Assessment Details Patient Name: Date of Service: [place] [name] Barbara NK E. 05/02/2020 1:00 PM Medical Record Number: [id number] Patient Account Number: [id number] Date of Birth/Sex: Treating RN: [birth year] (87 y.o. [name] Primary Care Sheyenne Konz: Sherrie Mustache Other Clinician: Referring Amiir Heckard: Treating Carmella Kees/Extender: Deloria Lair in Treatment: 40 Edema Assessment Assessed: Shirlyn Goltz: No] Patrice Paradise: No] Edema: [Left: N] [Right: o] Calf Left: Right: Point of Measurement: From Medial Instep 26 cm Ankle Left: Right: Point of Measurement: From Medial Instep 20 cm Vascular Assessment Pulses: Dorsalis Pedis Palpable: [Right:Yes] Electronic Signature(s) Signed: 05/02/2020 5:28:50 PM By: Levan Hurst RN, BSN Entered By: Levan Hurst on 05/02/2020 13:17:52 -------------------------------------------------------------------------------- Multi Wound Chart Details Patient Name: Date of Service: [place] [name] Barbara NK E. 05/02/2020 1:00 PM Medical Record Number: [id number] Patient Account Number: [id number] Date of Birth/Sex: Treating RN: [birth year] (87 y.o. [name] Primary Care Harryette Shuart: Sherrie Mustache Other Clinician: Referring Alvar Malinoski: Treating Jamille Fisher/Extender: Deloria Lair in Treatment: 40 Vital Signs Height(in):  75 Pulse(bpm): 60 Weight(lbs): 140 Blood Pressure(mmHg): 110/58 Body Mass Index(BMI): 20 Temperature(F): 97.4 Respiratory Rate(breaths/min): 16 Photos: [2:No Photos Right T Great oe] [3:No Photos Right Calcaneus] [6:No 73 Left T Fifth oe] Wound Location: [2:Gradually Appeared] [3:Gradually Appeared] [6:Pressure Injury] Wounding Event: [2:Diabetic Wound/Ulcer of the Lower] [3:Pressure Ulcer] [6:Pressure Ulcer] Primary Etiology: [2:Extremity N/A] [3:Arterial Insufficiency Ulcer] [6:Diabetic Wound/Ulcer of the Lower] Secondary Etiology: [2:Cataracts, Arrhythmia, Hypertension, Cataracts, Arrhythmia, Hypertension, Cataracts, Arrhythmia, Hypertension,] [6:Extremity] Comorbid History: [2:Peripheral Arterial Disease, Type II Peripheral Arterial Disease,  Type II Peripheral Arterial Disease, Type II Diabetes, Osteomyelitis, Dementia, Diabetes, Osteomyelitis, Dementia, Diabetes, Osteomyelitis, Dementia, Neuropathy,  Received Chemotherapy Neuropathy, Received Chemotherapy Neuropathy, Received Chemotherapy 06/15/2019] [3:09/21/2019] [6:05/01/2020] Date Acquired: [2:40] [3:28] [6:0] Weeks of Treatment: [2:Open] [3:Open] [6:Open] Wound Status: [2:4.5x5.5x0.1] [3:2x3.8x0.1] [6:0.3x0.3x0.1] Measurements L x W x D (cm) [2:19.439] [3:5.969] [6:0.071] A (cm) : rea [2:1.944] [3:0.597] [6:0.007] Volume (cm) : [2:-20579.80%] [3:86.10%] [6:N/A] % Reduction in A rea: [2:-6842.90%] [3:86.10%] [6:N/A] % Reduction in Volume: [2:Grade 4] [3:Unstageable/Unclassified] [6:Category/Stage II] Classification: [2:None Present] [3:Medium] [6:Medium] Exudate A mount: [2:N/A] [3:Serosanguineous] [6:Serosanguineous] Exudate Type: [2:N/A] [3:red, brown] [6:red, brown] Exudate Color: [2:Indistinct, nonvisible] [3:Flat and Intact] [6:Distinct, outline attached] Wound Margin: [2:None Present (0%)] [3:Medium (34-66%)] [6:Large (67-100%)] Granulation A mount: [2:N/A] [3:Pink] [6:Pink] Granulation Quality: [2:Large (67-100%)]  [3:Medium (34-66%)] [6:None Present (0%)] Necrotic A mount: [2:Eschar] [3:Adherent Slough] [6:N/A] Necrotic Tissue: [2:Fascia: No] [3:Fat Layer (Subcutaneous Tissue): Yes Fat Layer (Subcutaneous Tissue): Yes] Exposed Structures: [2:Fat Layer (Subcutaneous Tissue): No Fascia: No Tendon: No Muscle: No Joint: No Bone: No None] [3:Tendon: No Muscle: No Joint: No Bone: No None] [6:Fascia: No Tendon: No Muscle: No Joint: No Bone: No None] Treatment Notes Electronic Signature(s) Signed: 05/02/2020 5:01:02 PM By: Linton Ham MD Signed: 05/02/2020 5:39:22 PM By: Deon Pilling Entered By: Linton Ham on 05/02/2020 13:35:18 -------------------------------------------------------------------------------- Multi-Disciplinary Care Plan Details Patient Name: Date of Service: [name], [name] NK E. 05/02/2020 1:00 PM Medical Record Number: [id number] Patient Account Number: [id number] Date of Birth/Sex: Treating RN: January 01, 1924 (87 y.o. [name] Primary Care Beronica Lansdale: Sherrie Mustache Other Clinician: Referring Trista Ciocca: Treating Malka Bocek/Extender: Deloria Lair in Treatment: 40 Active Inactive Wound/Skin Impairment Nursing Diagnoses: Knowledge deficit related to ulceration/compromised skin integrity Goals: Patient/caregiver will verbalize understanding of skin care regimen Date Initiated: 07/25/2019 Target Resolution Date: 06/21/2020 Goal Status: Active Ulcer/skin breakdown will have a volume reduction of 30% by week 4 Date Initiated: 07/25/2019 Date Inactivated: 08/28/2019 Target Resolution Date: 08/25/2019 Goal Status: Unmet Unmet Reason: Osteomyelitis Interventions: Assess patient/caregiver ability to obtain necessary supplies Assess patient/caregiver ability to perform ulcer/skin care regimen upon admission and as needed Assess ulceration(s) every visit Notes: Electronic Signature(s) Signed: 05/02/2020 5:39:22 PM By: Deon Pilling Entered By: Deon Pilling on  05/02/2020 13:22:19 -------------------------------------------------------------------------------- Pain Assessment Details Patient Name: Date of Service: [place] [name] Barbara NK E. 05/02/2020 1:00 PM Medical Record Number: [id number] Patient Account Number: [id number] Date of Birth/Sex: Treating RN: Jun 03, 1923 (87 y.o. [name] Primary Care Fritz Cauthon: Sherrie Mustache Other Clinician: Referring Barnell Shieh: Treating Evelia Waskey/Extender: Deloria Lair in Treatment: 40 Active Problems Location of Pain Severity and Description of Pain Patient Has Paino No Site Locations Pain Management and Medication Current Pain Management: Electronic Signature(s) Signed: 05/02/2020 5:28:50 PM By: Levan Hurst RN, BSN Entered By: Levan Hurst on 05/02/2020 13:09:51 -------------------------------------------------------------------------------- Patient/Caregiver Education Details Patient Name: Date of Service: [place] Doylene Canard 1/13/2022andnbsp1:00 PM Medical Record Number: [id number] Patient Account Number: [id number] Date of Birth/Gender: Treating RN: [birth year] (87 y.o. [name] Primary Care Physician: Sherrie Mustache Other Clinician: Referring Physician: Treating Physician/Extender: Deloria Lair in Treatment: 73 Education Assessment Education Provided To: Caregiver Education Topics Provided Wound/Skin Impairment: Handouts: Skin Care Do's and Dont's Methods: Explain/Verbal Responses: Reinforcements needed Electronic Signature(s) Signed: 05/02/2020 5:39:22 PM By: Deon Pilling Entered By: Deon Pilling on 05/02/2020 13:38:26 -------------------------------------------------------------------------------- Wound Assessment Details Patient Name: Date of Service: [name] E. 05/02/2020 1:00 PM Medical Record Number: [id number] Patient Account Number: [id number] Date of Birth/Sex: Treating RN:  1923-06-03 (87 y.o. [name] Primary Care Ayame Rena: Sherrie Mustache Other Clinician: Referring Chong Wojdyla: Treating Doye Montilla/Extender: Deloria Lair in Treatment: 67 Wound Status Wound Number: 2 Primary Diabetic Wound/Ulcer of the Lower Extremity Etiology: Wound Location: Right T Great oe Wound Open Wounding Event: Gradually Appeared Status: Date Acquired: 06/15/2019 Comorbid Cataracts, Arrhythmia, Hypertension, Peripheral Arterial Disease, Weeks Of Treatment: 40 History: Type II Diabetes, Osteomyelitis, Dementia, Neuropathy, Received Clustered Wound: No Chemotherapy Wound Measurements Length: (cm) 4.5 Width: (cm) 5.5 Depth: (cm) 0.1 Area: (cm) 19.439 Volume: (cm) 1.944 % Reduction in Area: -20579.8% % Reduction in Volume: -6842.9% Epithelialization: None Tunneling: No Undermining: No Wound Description Classification: Grade 4 Wound Margin: Indistinct, nonvisible Exudate Amount: None Present Foul Odor After Cleansing: No Slough/Fibrino No Wound Bed Granulation Amount: None Present (0%) Exposed Structure Necrotic Amount: Large (67-100%) Fascia Exposed: No Necrotic Quality: Eschar Fat Layer (Subcutaneous Tissue) Exposed: No Tendon Exposed: No Muscle Exposed: No Joint Exposed: No Bone Exposed: No Treatment Notes Wound #2 (Toe Great) Wound Laterality: Right Cleanser Soap and Water Discharge Instruction: May shower and wash wound with dial antibacterial soap and water prior to dressing change. Peri-Wound Care Topical Primary Dressing betadine Discharge Instruction: to toe daily Secondary Dressing Secured With Compression Wrap Compression Stockings Add-Ons Electronic Signature(s) Signed: 05/02/2020 5:28:50 PM By: Levan Hurst RN, BSN Entered By: Levan Hurst on 05/02/2020 13:17:30 -------------------------------------------------------------------------------- Wound Assessment Details Patient Name: Date of Service: [name]  05/02/2020 1:00 PM Medical Record Number: [id number] Patient Account Number: [id number] Date of Birth/Sex: Treating RN: October 20, 1923 (87 y.o. [name] Primary Care Ebrahim Deremer: Sherrie Mustache Other Clinician: Referring Cherri Yera: Treating Martell Mcfadyen/Extender: Deloria Lair in Treatment: 40 Wound Status Wound Number: 3 Primary Pressure Ulcer Etiology: Wound Location: Right Calcaneus Secondary Arterial Insufficiency Ulcer Wounding Event: Gradually Appeared Etiology: Date Acquired: 09/21/2019 Wound Open Weeks Of Treatment: 28 Status: Clustered Wound: No Comorbid Cataracts, Arrhythmia, Hypertension, Peripheral Arterial Disease, History: Type II Diabetes, Osteomyelitis, Dementia, Neuropathy, Received Chemotherapy Wound Measurements Length: (cm) 2 Width: (cm) 3.8 Depth: (cm) 0.1 Area: (cm) 5.969 Volume: (cm) 0.597 % Reduction in Area: 86.1% % Reduction in Volume: 86.1% Epithelialization: None Tunneling: No Undermining: No Wound Description Classification: Unstageable/Unclassified Wound Margin: Flat and Intact Exudate Amount: Medium Exudate Type: Serosanguineous Exudate Color: red, brown Foul Odor After Cleansing: No Slough/Fibrino Yes Wound Bed Granulation Amount: Medium (34-66%) Exposed Structure Granulation Quality: Pink Fascia Exposed: No Necrotic Amount: Medium (34-66%) Fat Layer (Subcutaneous Tissue) Exposed: Yes Necrotic Quality: Adherent Slough Tendon Exposed: No Muscle Exposed: No Joint Exposed: No Bone Exposed: No Treatment Notes Wound #3 (Calcaneus) Wound Laterality: Right Cleanser Soap and Water Discharge Instruction: May shower and wash wound with dial antibacterial soap and water prior to dressing change. Peri-Wound Care Topical Primary Dressing FIBRACOL Plus Dressing, 2x2 in (collagen) Discharge Instruction: home health to supply hydrogel if possible. Moisten collagen with saline or hydrogel Secondary Dressing ALLEVYN  Heel 4 1/2in x 5 1/2in / 10.5cm x 13.5cm Discharge Instruction: Apply over primary dressing as directed. Secured With [employer], 4.5x3.1 (in/yd) Discharge Instruction: Secure with Kerlix as directed. Paper Tape, 2x10 (in/yd) Discharge Instruction: Secure dressing with tape as directed. Compression Wrap Compression Stockings Add-Ons Electronic Signature(s) Signed: 05/02/2020 5:28:50 PM By: Levan Hurst RN, BSN Entered By: Levan Hurst on 05/02/2020 13:17:42 -------------------------------------------------------------------------------- Wound Assessment Details Patient Name: Date of Service: [name] 05/02/2020 1:00 PM Medical Record Number: [id number] Patient Account Number: [id number] Date of Birth/Sex: Treating RN: 1923/08/30 (87  y.o. [name] Primary Care Kordae Buonocore: Sherrie Mustache Other Clinician: Referring Hester Joslin: Treating Alvera Tourigny/Extender: Deloria Lair in Treatment: 48 Wound Status Wound Number: 6 Primary Pressure Ulcer Etiology: Wound Location: Left T Fifth oe Secondary Diabetic Wound/Ulcer of the Lower Extremity Wounding Event: Pressure Injury Etiology: Date Acquired: 05/01/2020 Wound Open Weeks Of Treatment: 0 Status: Clustered Wound: No Comorbid Cataracts, Arrhythmia, Hypertension, Peripheral Arterial Disease, History: Type II Diabetes, Osteomyelitis, Dementia, Neuropathy, Received Chemotherapy Wound Measurements Length: (cm) 0.3 Width: (cm) 0.3 Depth: (cm) 0.1 Area: (cm) 0.071 Volume: (cm) 0.007 % Reduction in Area: % Reduction in Volume: Epithelialization: None Tunneling: No Undermining: No Wound Description Classification: Category/Stage II Wound Margin: Distinct, outline attached Exudate Amount: Medium Exudate Type: Serosanguineous Exudate Color: red, brown Foul Odor After Cleansing: No Slough/Fibrino No Wound Bed Granulation Amount: Large (67-100%) Exposed Structure Granulation  Quality: Pink Fascia Exposed: No Necrotic Amount: None Present (0%) Fat Layer (Subcutaneous Tissue) Exposed: Yes Tendon Exposed: No Muscle Exposed: No Joint Exposed: No Bone Exposed: No Treatment Notes Wound #6 (Toe Fifth) Wound Laterality: Left Cleanser Peri-Wound Care Topical Primary Dressing Maxorb Extra Calcium Alginate 2x2 in Discharge Instruction: Apply calcium alginate to wound bed as instructed Secondary Dressing Woven Gauze Sponges 2x2 in Discharge Instruction: Apply over primary dressing as directed. Secured With Conforming Stretch Gauze Bandage, Sterile 2x75 (in/in) Discharge Instruction: Ensure to take ensure the toe is does not bend. .Secure with stretch gauze as directed. Compression Wrap Compression Stockings Add-Ons Electronic Signature(s) Signed: 05/02/2020 5:39:22 PM By: Deon Pilling Entered By: Deon Pilling on 05/02/2020 13:30:50 -------------------------------------------------------------------------------- Vitals Details Patient Name: Date of Service: MA [name] Barbara NK E. 05/02/2020 1:00 PM Medical Record Number: [id number] Patient Account Number: [id number] Date of Birth/Sex: Treating RN: Jun 30, 1923 (87 y.o. [name] Primary Care Antonyo Hinderer: Sherrie Mustache Other Clinician: Referring Avanelle Pixley: Treating Lema Heinkel/Extender: Deloria Lair in Treatment: 40 Vital Signs Time Taken: 13:09 Temperature (F): 97.4 Height (in): 70 Pulse (bpm): 77 Weight (lbs): 140 Respiratory Rate (breaths/min): 16 Body Mass Index (BMI): 20.1 Blood Pressure (mmHg): 110/58 Reference Range: 80 - 120 mg / dl Electronic Signature(s) Signed: 05/02/2020 5:28:50 PM By: Levan Hurst RN, BSN Entered By: Levan Hurst on 05/02/2020 13:09:46

## 2020-05-02 NOTE — Progress Notes (Signed)
RENO, STICKELS ([id number]) Visit Report for 05/02/2020 HPI Details Patient Name: Date of Service: [name] [facility] E. 05/02/2020 1:00 PM Medical Record Number: [id number] Patient Account Number: [id number] Date of Birth/Sex: Treating RN: [birth year] (87 y.o. [name] Primary Care Provider: Sherrie Mustache Other Clinician: Referring Provider: Treating Provider/Extender: Deloria Lair in Treatment: 37 History of Present Illness HPI Description: ADMISSION 05/02/2019 This is a 87 year old man accompanied by his daughter. He lives in [place] with his elderly wife. He has home caregivers as well. Apparently on February 28, 2019 they noted blood on his sock and noticed a wound on the tip of his right great toe. He has been cared for by Dr. Posey Pronto who is his podiatrist in [place]. Apparently they have been using Betadine to this area. The patient has a thick mycotic nail on the right as well which is embedded in the wound in some areas. Fortunately the patient has diabetic neuropathy and is not really experiencing any pain otherwise I think this would all be quite painful. The patient was previously treated in 2019 for wounds on the left foot. This included a fairly thorough vascular review by vein and vascular Dr. Oneida Alar. His ABIs at that time were noncompressible bilaterally TBI on the right was 0.88 with biphasic waveforms on the right and monophasic waveforms at the left. He ended up having amputations of the left first and second toes. He did have an angiogram on 06/25/2017 which showed on the right the right common femoral profundofemoral for Morris and superficial femoral arteries were widely patent the right popliteal was widely patent. Tibial vessels were not well opacified secondary to motion artifact however he was felt to have all 3 tibial vessels intact with some calcification and some areas of stenosis within the posterior tibial artery  but relatively good flow to the right lower leg. He also had the left reviewed as well which was the source of the problem at that time. Past medical history; prostate cancer on oral antiandrogen therapy with bone mets, GI stromal tumor in 2005, type 2 diabetes with peripheral neuropathy, hypertension, atrial fibrillation on Eliquis, history of non-Hodgkin's lymphoma treated with CHOP in 2005. He had amputations of the left first and second toes secondary to osteomyelitis. Does not appear that he has had an x-ray of the right foot ABI in our clinic on the right was noncompressible [see full arterial discussion above]. 1/19; area on the tip of the right great toe under a very mycotic nail tip. We use silver alginate. 1/26; area of the tip of the right great toe under a very mycotic nail. A lot of this seems to have closed down we have been using silver alginate 2/9; the areas on the tip of the right great toe under a very mycotic nail. A lot of this continues to close down. I do not think there is a subungual problem. I am hopeful that this mycotic nail which is thick and raised will allow full epithelialization of the wound 2/23; this was a difficult area on the tip of his right great toe. Thick mycotic nail over the base of the wound. This appears to have closed down now. The nail itself is thick split with considerable subungual debris. READMISSION 07/25/2019 This is a 87 year old man that we cared for earlier this year in the clinic without a wound on the tip of his right great toe under a very mycotic toenail. He is a type II diabetic. His  ABIs have been noncompressible however he has been felt to have adequate blood flow in the right to heal the wound. We were able to get this wound to close over as I understand things he followed with Dr. Caprice Beaver had some debridement of the nail everything looks fine and apparently the wound bed was washed off by his wife over the weekend and by Monday it was  open. His daughter is able to show me pictures. Unfortunately this now has exposed bone raising the possibility that he has had underlying osteomyelitis all along. He has been using Silvadene cream on this he has been referred back to the clinic by Dr. Caprice Beaver. He had x-rays of the right foot that did not show evidence of osteomyelitis in the right foot. He has not been on antibiotics He also had an x-ray of the left foot that showed findings suspicious for osteomyelitis involving the residual base of the first proximal phalanx and the proximal phalanx of the left second toe as well as the left second metatarsal head however he has no wounds in this area. 4/12; culture from last time of bone in the right great toe was negative. The patient's wife apparently scrubbed this toe vigorously and according to his daughter reopen the wound however unfortunately it is all exposed bone last time. I removed some of this and we use Prisma unfortunately the area does not look much better today. Previous x-rays done in podiatry office Dr. Caprice Beaver did not show evidence of osteomyelitis. Nevertheless I think osteomyelitis here is probably quite likely. Still using Prisma 4/26; he took 2 weeks of doxycycline although his daughter is telling me he is not eating. He has loose bowel movements however I did not get the sense that this is changed all that much. He still has exposed bone. Culture I did last time showed a few coag negative staph I do not think that this is necessarily relevant. I still am operating under the premise that the patient probably has osteomyelitis. After considerable difficulty we are able to determine he is not allergic to cephalosporins I therefore gave him Keflex. 5/10; is tolerating Keflex 500 3 times daily quite well. He still has the small wound in the tip of his toe with exposed bone.Marland Kitchen Unfortunately he does not really have any granulation tissue. He saw Dr. Caprice Beaver of podiatry in  [place] last week I have not had a chance to look at his notes The patient had an extensive vascular work-up including an angiogram in 2019. He was felt to have adequate blood supply on the right at the time although he did have amputations of the left first and second toe by Dr. Oneida Alar. 09/11/19 -Patient is back and is about to finish his Keflex, appointment with Dr. Oneida Alar is in the works. The right great toe wound looks about the same, the left third toe dorsal wound has healed. There was a small eschar covering it that was removed by podiatry.According to patient's daughter the right great toe base on the plantar surface had some redness that seems to have disappeared. 10/13/2019; patient is back in clinic today after 1 month hiatus although I have not seen him since 5/10. Since he was last here he was admitted to hospital from 6/2 through 6/5 because of a nonhealing wound on his right great toe. He underwent an angiogram which showed patent common femoral, profunda, SFA above and below-knee popliteal artery. The dominant runoff in the right lower extremity was through the anterior tibial  but that became very small at the level of the ankle and had minimal outflow into the foot. The peroneal artery was patent but atretic. 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 Interventions were attempted on the posterior tibial. Mechanical orbital atherectomy down to the level of the ankle and then this was angioplastied with 2.3 mm and 3 mm balloons. Flow remained very sluggish down the posterior tibial artery but was patent. Post event there apparently was embolization of an area involving the plantar left heel and sides of the left heel. He arrives back in clinic today it has been almost 7 weeks since I have seen him. Miraculously the great toe area with open bone is closed over  probably as result of Dr. Ainsley Spinner interventions. He does however have an extensive ischemic area on the plantar and medial aspect of his heel. I reviewed Dr. Ainsley Spinner last note from 3 days ago on 6/22. He did not think there was enough inflow to heal the heel wound. With eschar and sloughing. Wondered whether he could heal a below-knee amputation but more likely an above the knee amputation when required. He was placed on Xeroform to the right heel In discussing things with the patient's family and the patient present he is not in a lot of pain. The area does not appear to be grossly infected. The patient appears to be comfortable and conversational. He is eating and drinking fairly well. 7/9; the patient saw Dr. Carlis Abbott on 6/29. He had previously done heroic attempt to establish blood flow as described above. 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 Although the eschar on the heel looks ominous both plantar and medial it is certainly no worse than last week. We will continue Betadine to this area with alginate. Ultimately this will separate off and we will see about the viability of the tissue underneath. Also in clinic today our nurses were washing his right great toe tip apparently some of the skin came off eschared and there is an open wound at the tip of the great toe with exposed bone similar to what he had previously 7/23; he has the open area on the tip of his first toe which I think is mostly bone. Then he has the embolic area on the plantar heel extending into the medial heel. We have been using Betadine and silver alginate to the heel silver alginate to the toe. His daughter brings up that she was given nitroglycerin patches by Dr. Donzetta Matters to try and open blood flow to the foot. She has not been using them out of concern of systemic hypotension and also that the labels said not to  put them on extremities 8/10-Returns at 3 weeks, open area on the tip of his first toe that is dried out, the heel with eschar, applying Betadine to the eschar area and silver alginate dressing. 9/7; 1 month follow-up. He has an open area on the tip of the toe almost subungual with a thick mycotic nail. The heel wound has black eschar but this is beginning to separate. We have been using Betadine and silver alginate on the heel and simply silver alginate on the right great toe 9/23; the patient was worked in urgently at the request of his wife was concerned about infection in the tip of the  toe. Apparently this was brought up by home health. 10/14; the patient has the area of exposed bone on the tip of the right great toe. He also has the eschared area on the plantar heel which was a embolic phenomenon at the time of attempted revascularization. He also had an area of denuded epithelium over both buttocks which I think is probably a combination of pressure friction and moisture. They have been using zinc oxide which is appropriate 10/26. The patient has an area of wider exposed bone at the tip of the right great toe. The substantial eschared area on the heel was already 50% separated I remove this. Actually the patient's wife had called repetitively for urgent appointments with some degree of erythema in the foot as the primary concern. It turns out that she has been to see her primary doctor after the patient had a fall off the commode resulting in skin tears to both forearms. He was placed on doxycycline because of concern of cellulitis of the toe. She is also been to see [facility] in [place] with regards to the toe. Apparently he shaved the bone and recommended Betadine to this. Noteworthy that the patient already had an extensive arterial evaluation with an angiogram. He has known PAD with tibial vessel disease. The last effort was an attempt to open up the posterior tibial  artery which resulted in symptoms of embolization to the heel. 11/9; since the patient was last here he was brought to the ER on 11/4. He was felt to have cellulitis in the right great toe. They did not do a wound culture however they did blood cultures which showed 3 out of 3 Staph hominis. He has been treated with clindamycin 150 mg 3 times times a day. His daughter says he went to the hospital with altered LOC. He had been running a fever but was not febrile on arrival to the ER. Looking over the emergency room notes he had erythema in his foot and I think he had cellulitis. He had apparently been on doxycycline 100 mg twice daily prescribed by Dr. Caprice Beaver prior to this. The staph isolate itself is very resistant it is sensitive to vancomycin but resistant to methicillin's, quinolones, tetracycline and Septra. During the stay in the ER she was seen by vein and vascular they noted that he does not have any further options for revascularization. They do not think that he could undergo any foot conserving surgery therefore the only thing left here would be a BKA The area of the heel that I debrided last time he is here actually looks quite healthy nice healthy looking granulation tissue over perhaps 60 to 70% of the wound still 30% slough we have been using Iodoflex. They have been using silver collagen to the toe The other issue here is the daughter is struggling with the idea of in-home hospice. I went over this with her. I emphasized that hospice is not a home care agency as a velocity of care at the end of life. Patient is 87 years old he apparently is eating and drinking less and since I have known him he has become more frail 03/11/2020 upon evaluation today patient unfortunately is showing signs of progression in regard to his right great toe. Currently he is actually experiencing increased evidence of skin deterioration and in fact this toe is developing into more of a dry gangrene issue at  this point. He was signed up for hospice on Thursday and then apparently hospice was  called off on Friday due to the fact that they were talking about doing wet-to-dry dressings to both wound locations and his daughter was really concerned about this. She did not want anything to make the wounds progress faster than they should be. With that being said I think a wet-to-dry dressing would be okay for the heel location. I do not believe this would cause any harm. With that being said and in regard to the toe I really do agree I would not really want a wet-to-dry dressing here I think more of like Betadine and dry gauze dressing could be a appropriate way to go after the toe was painted with Betadine that is. 12/9. It has been a month since I saw this man. He was using Betadine to the great toe and Iodoflex to the area on the heel. The heel actually look quite good last time I saw this this is gotten larger and not nearly as healthy looking as last time. Moreover the right first toe wound now has dry gangrene Since they were last here they have not elected to sign up for hospice. They do not have home health. They are trying to get some home care through the [facility] 1/13; the patient's right first toe is mummified to the inter phalangeal joint. The area on the plantar aspect of his right heel actually measures quite a bit better. We have been using Iodoflex His daughter points out an area on the plantar aspect of the left 5th toe at roughly the level of the proximal inner phalangeal Electronic Signature(s) Signed: 05/02/2020 5:01:02 PM By: Linton Ham MD Entered By: Linton Ham on 05/02/2020 13:39:05 -------------------------------------------------------------------------------- Physical Exam Details Patient Name: Date of Service: [name] 05/02/2020 1:00 PM Medical Record Number: [id number] Patient Account Number: [id number] Date of Birth/Sex: Treating RN: Nov 22, 1923 (87 y.o. [name] Primary Care Provider: Sherrie Mustache Other Clinician: Referring Provider: Treating Provider/Extender: Deloria Lair in Treatment: 40 Constitutional Sitting or standing Blood Pressure is within target range for patient.. Pulse regular and within target range for patient.Marland Kitchen Respirations regular, non-labored and within target range.. Temperature is normal and within the target range for the patient.Marland Kitchen Appears in no distress. Cardiovascular Pedal pulses absent bilaterally.. Notes Wound exam; the patient's right first toe is mummified to the interphalangeal joint however it does not look infected and it does not cause him pain The area on the right plantar heel looks clean edges fairly thick although I did not debride this today. Surface area is smaller. On the plantar part of the left fifth toe likely secondary to pressure from a severe hammer deformity there is a small open area. I think this will heal with relief of the pressure. He does seem to have a palpable pulse on the left foot Electronic Signature(s) Signed: 05/02/2020 5:01:02 PM By: Linton Ham MD Entered By: Linton Ham on 05/02/2020 13:46:46 -------------------------------------------------------------------------------- Physician Orders Details Patient Name: Date of Service: [name] 05/02/2020 1:00 PM Medical Record Number: [id number] Patient Account Number: [id number] Date of Birth/Sex: Treating RN: 1924-03-24 (87 y.o. [name] Primary Care Provider: Sherrie Mustache Other Clinician: Referring Provider: Treating Provider/Extender: Deloria Lair in Treatment: 46 Verbal / Phone Orders: No Diagnosis Coding ICD-10 Coding Code Description E11.621 Type 2 diabetes mellitus with foot ulcer L97.514 Non-pressure chronic ulcer of other part of right foot with necrosis of bone E11.40 Type 2 diabetes mellitus with diabetic neuropathy,  unspecified L97.412 Non-pressure  chronic ulcer of right heel and midfoot with fat layer exposed Follow-up Appointments Return appointment in 1 month. Bathing/ Shower/ Hygiene May shower and wash wound with soap and water. - with dressing changes Edema Control - Lymphedema / SCD / Other Elevate legs to the level of the heart or above for 30 minutes daily and/or when sitting, a frequency of: Avoid standing for long periods of time. Moisturize legs daily. Home Health New wound care orders this week; continue Home Health for wound care. May utilize formulary equivalent dressing for wound treatment orders unless otherwise specified. - Advance home health to change dressings weekly. Wound Treatment Wound #2 - T Great oe Wound Laterality: Right Cleanser: Soap and Water (Home Health) 1 x Per Day/30 Days Discharge Instructions: May shower and wash wound with dial antibacterial soap and water prior to dressing change. Prim Dressing: betadine (Home Health) 1 x Per Day/30 Days ary Discharge Instructions: to toe daily Wound #3 - Calcaneus Wound Laterality: Right Cleanser: Soap and Water [facility]) Every Other Day/30 Days Discharge Instructions: May shower and wash wound with dial antibacterial soap and water prior to dressing change. Prim Dressing: FIBRACOL Plus Dressing, 2x2 in (collagen) [facility]) Every Other Day/30 Days ary Discharge Instructions: home health to supply hydrogel if possible. Moisten collagen with saline or hydrogel Secondary Dressing: ALLEVYN Heel 4 1/2in x 5 1/2in / 10.5cm x 13.5cm [facility]) Every Other Day/30 Days Discharge Instructions: Apply over primary dressing as directed. Secured With: [employer], 4.5x3.1 (in/yd) (Generic) Every Other Day/30 Days Discharge Instructions: Secure with Kerlix as directed. Secured With: Paper Tape, 2x10 (in/yd) (Generic) Every Other Day/30 Days Discharge Instructions: Secure dressing with tape as directed. Wound #6 - T  Fifth oe Wound Laterality: Left Prim Dressing: Maxorb Extra Calcium Alginate 2x2 in [facility]) Every Other Day/30 Days ary Discharge Instructions: Apply calcium alginate to wound bed as instructed Secondary Dressing: Woven Gauze Sponges 2x2 in [facility]) Every Other Day/30 Days Discharge Instructions: Apply over primary dressing as directed. Secured With: Child psychotherapist, Sterile 2x75 (in/in) (Home Health) Every Other Day/30 Days Discharge Instructions: Ensure to take ensure the toe is does not bend. .Secure with stretch gauze as directed. Electronic Signature(s) Signed: 05/02/2020 5:01:02 PM By: Linton Ham MD Signed: 05/02/2020 5:39:22 PM By: Deon Pilling Entered By: Deon Pilling on 05/02/2020 13:34:34 -------------------------------------------------------------------------------- Problem List Details Patient Name: Date of Service: [place] [name] 05/02/2020 1:00 PM Medical Record Number: [id number] Patient Account Number: [id number] Date of Birth/Sex: Treating RN: [birth year] (87 y.o. Lorette Ang, Meta.Reding Primary Care Provider: Sherrie Mustache Other Clinician: Referring Provider: Treating Provider/Extender: Deloria Lair in Treatment: 22 Active Problems ICD-10 Encounter Code Description Active Date MDM Diagnosis E11.621 Type 2 diabetes mellitus with foot ulcer 07/25/2019 No Yes L97.514 Non-pressure chronic ulcer of other part of right foot with necrosis of bone 07/25/2019 No Yes E11.40 Type 2 diabetes mellitus with diabetic neuropathy, unspecified 07/25/2019 No Yes L97.412 Non-pressure chronic ulcer of right heel and midfoot with fat layer exposed 02/13/2020 No Yes L97.521 Non-pressure chronic ulcer of other part of left foot limited to breakdown of 05/02/2020 No Yes skin Inactive Problems ICD-10 Code Description Active Date Inactive Date S40.812D Abrasion of left upper arm, subsequent encounter 02/13/2020 02/13/2020 S40.811D  Abrasion of right upper arm, subsequent encounter 02/13/2020 02/13/2020 Resolved Problems Electronic Signature(s) Signed: 05/02/2020 5:01:02 PM By: Linton Ham MD Entered By: Linton Ham on 05/02/2020 13:35:12 -------------------------------------------------------------------------------- Progress Note Details Patient Name: Date of Service:  MA RTIN, FRA NK E. 05/02/2020 1:00 PM Medical Record Number: [id number] Patient Account Number: [id number] Date of Birth/Sex: Treating RN: 07/01/1923 (87 y.o. [name] Primary Care Provider: Sherrie Mustache Other Clinician: Referring Provider: Treating Provider/Extender: Deloria Lair in Treatment: 40 Subjective History of Present Illness (HPI) ADMISSION 05/02/2019 This is a 87 year old man accompanied by his daughter. He lives in [place] with his elderly wife. He has home caregivers as well. Apparently on February 28, 2019 they noted blood on his sock and noticed a wound on the tip of his right great toe. He has been cared for by Dr. Posey Pronto who is his podiatrist in [place]. Apparently they have been using Betadine to this area. The patient has a thick mycotic nail on the right as well which is embedded in the wound in some areas. Fortunately the patient has diabetic neuropathy and is not really experiencing any pain otherwise I think this would all be quite painful. The patient was previously treated in 2019 for wounds on the left foot. This included a fairly thorough vascular review by vein and vascular Dr. Oneida Alar. His ABIs at that time were noncompressible bilaterally TBI on the right was 0.88 with biphasic waveforms on the right and monophasic waveforms at the left. He ended up having amputations of the left first and second toes. He did have an angiogram on 06/25/2017 which showed on the right the right common femoral profundofemoral for Morris and superficial femoral arteries were widely  patent the right popliteal was widely patent. Tibial vessels were not well opacified secondary to motion artifact however he was felt to have all 3 tibial vessels intact with some calcification and some areas of stenosis within the posterior tibial artery but relatively good flow to the right lower leg. He also had the left reviewed as well which was the source of the problem at that time. Past medical history; prostate cancer on oral antiandrogen therapy with bone mets, GI stromal tumor in 2005, type 2 diabetes with peripheral neuropathy, hypertension, atrial fibrillation on Eliquis, history of non-Hodgkin's lymphoma treated with CHOP in 2005. He had amputations of the left first and second toes secondary to osteomyelitis. Does not appear that he has had an x-ray of the right foot ABI in our clinic on the right was noncompressible [see full arterial discussion above]. 1/19; area on the tip of the right great toe under a very mycotic nail tip. We use silver alginate. 1/26; area of the tip of the right great toe under a very mycotic nail. A lot of this seems to have closed down we have been using silver alginate 2/9; the areas on the tip of the right great toe under a very mycotic nail. A lot of this continues to close down. I do not think there is a subungual problem. I am hopeful that this mycotic nail which is thick and raised will allow full epithelialization of the wound 2/23; this was a difficult area on the tip of his right great toe. Thick mycotic nail over the base of the wound. This appears to have closed down now. The nail itself is thick split with considerable subungual debris. READMISSION 07/25/2019 This is a 87 year old man that we cared for earlier this year in the clinic without a wound on the tip of his right great toe under a very mycotic toenail. He is a type II diabetic. His ABIs have been noncompressible however he has been felt to have adequate blood flow in  the right to heal the  wound. We were able to get this wound to close over as I understand things he followed with Dr. Caprice Beaver had some debridement of the nail everything looks fine and apparently the wound bed was washed off by his wife over the weekend and by Monday it was open. His daughter is able to show me pictures. Unfortunately this now has exposed bone raising the possibility that he has had underlying osteomyelitis all along. He has been using Silvadene cream on this he has been referred back to the clinic by Dr. Caprice Beaver. He had x-rays of the right foot that did not show evidence of osteomyelitis in the right foot. He has not been on antibiotics He also had an x-ray of the left foot that showed findings suspicious for osteomyelitis involving the residual base of the first proximal phalanx and the proximal phalanx of the left second toe as well as the left second metatarsal head however he has no wounds in this area. 4/12; culture from last time of bone in the right great toe was negative. The patient's wife apparently scrubbed this toe vigorously and according to his daughter reopen the wound however unfortunately it is all exposed bone last time. I removed some of this and we use Prisma unfortunately the area does not look much better today. Previous x-rays done in podiatry office Dr. Caprice Beaver did not show evidence of osteomyelitis. Nevertheless I think osteomyelitis here is probably quite likely. Still using Prisma 4/26; he took 2 weeks of doxycycline although his daughter is telling me he is not eating. He has loose bowel movements however I did not get the sense that this is changed all that much. He still has exposed bone. Culture I did last time showed a few coag negative staph I do not think that this is necessarily relevant. I still am operating under the premise that the patient probably has osteomyelitis. After considerable difficulty we are able to determine he is not allergic to cephalosporins I  therefore gave him Keflex. 5/10; is tolerating Keflex 500 3 times daily quite well. He still has the small wound in the tip of his toe with exposed bone.Marland Kitchen Unfortunately he does not really have any granulation tissue. He saw Dr. Caprice Beaver of podiatry in [place] last week I have not had a chance to look at his notes The patient had an extensive vascular work-up including an angiogram in 2019. He was felt to have adequate blood supply on the right at the time although he did have amputations of the left first and second toe by Dr. Oneida Alar. 09/11/19 -Patient is back and is about to finish his Keflex, appointment with Dr. Oneida Alar is in the works. The right great toe wound looks about the same, the left third toe dorsal wound has healed. There was a small eschar covering it that was removed by podiatry.According to patient's daughter the right great toe base on the plantar surface had some redness that seems to have disappeared. 10/13/2019; patient is back in clinic today after 1 month hiatus although I have not seen him since 5/10. Since he was last here he was admitted to hospital from 6/2 through 6/5 because of a nonhealing wound on his right great toe. He underwent an angiogram which showed patent common femoral, profunda, SFA above and below-knee popliteal artery. The dominant runoff in the right lower extremity was through the anterior tibial but that became very small at the level of the ankle and had minimal outflow  into the foot. The peroneal artery was patent but atretic. 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 Interventions were attempted on the posterior tibial. Mechanical orbital atherectomy down to the level of the ankle and then this was angioplastied with 2.3 mm and 3 mm balloons. Flow remained very sluggish down the posterior tibial artery but was patent. Post  event there apparently was embolization of an area involving the plantar left heel and sides of the left heel. He arrives back in clinic today it has been almost 7 weeks since I have seen him. Miraculously the great toe area with open bone is closed over probably as result of Dr. Ainsley Spinner interventions. He does however have an extensive ischemic area on the plantar and medial aspect of his heel. I reviewed Dr. Ainsley Spinner last note from 3 days ago on 6/22. He did not think there was enough inflow to heal the heel wound. With eschar and sloughing. Wondered whether he could heal a below-knee amputation but more likely an above the knee amputation when required. He was placed on Xeroform to the right heel In discussing things with the patient's family and the patient present he is not in a lot of pain. The area does not appear to be grossly infected. The patient appears to be comfortable and conversational. He is eating and drinking fairly well. 7/9; the patient saw Dr. Carlis Abbott on 6/29. He had previously done heroic attempt to establish blood flow as described above. 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 Although the eschar on the heel looks ominous both plantar and medial it is certainly no worse than last week. We will continue Betadine to this area with alginate. Ultimately this will separate off and we will see about the viability of the tissue underneath. Also in clinic today our nurses were washing his right great toe tip apparently some of the skin came off eschared and there is an open wound at the tip of the great toe with exposed bone similar to what he had previously 7/23; he has the open area on the tip of his first toe which I think is mostly bone. Then he has the embolic area on the plantar heel extending into the medial heel. We have been using Betadine and silver alginate to the heel  silver alginate to the toe. His daughter brings up that she was given nitroglycerin patches by Dr. Donzetta Matters to try and open blood flow to the foot. She has not been using them out of concern of systemic hypotension and also that the labels said not to put them on extremities 8/10-Returns at 3 weeks, open area on the tip of his first toe that is dried out, the heel with eschar, applying Betadine to the eschar area and silver alginate dressing. 9/7; 1 month follow-up. He has an open area on the tip of the toe almost subungual with a thick mycotic nail. The heel wound has black eschar but this is beginning to separate. We have been using Betadine and silver alginate on the heel and simply silver alginate on the right great toe 9/23; the patient was worked in urgently at the request of his wife was concerned about infection in the tip of the toe. Apparently this was brought up by home health. 10/14; the patient has the area  of exposed bone on the tip of the right great toe. He also has the eschared area on the plantar heel which was a embolic phenomenon at the time of attempted revascularization. He also had an area of denuded epithelium over both buttocks which I think is probably a combination of pressure friction and moisture. They have been using zinc oxide which is appropriate 10/26. The patient has an area of wider exposed bone at the tip of the right great toe. The substantial eschared area on the heel was already 50% separated I remove this. Actually the patient's wife had called repetitively for urgent appointments with some degree of erythema in the foot as the primary concern. It turns out that she has been to see her primary doctor after the patient had a fall off the commode resulting in skin tears to both forearms. He was placed on doxycycline because of concern of cellulitis of the toe. She is also been to see [facility] in [place] with regards to the toe. Apparently he shaved  the bone and recommended Betadine to this. Noteworthy that the patient already had an extensive arterial evaluation with an angiogram. He has known PAD with tibial vessel disease. The last effort was an attempt to open up the posterior tibial artery which resulted in symptoms of embolization to the heel. 11/9; since the patient was last here he was brought to the ER on 11/4. He was felt to have cellulitis in the right great toe. They did not do a wound culture however they did blood cultures which showed 3 out of 3 Staph hominis. He has been treated with clindamycin 150 mg 3 times times a day. His daughter says he went to the hospital with altered LOC. He had been running a fever but was not febrile on arrival to the ER. Looking over the emergency room notes he had erythema in his foot and I think he had cellulitis. He had apparently been on doxycycline 100 mg twice daily prescribed by Dr. Caprice Beaver prior to this. The staph isolate itself is very resistant it is sensitive to vancomycin but resistant to methicillin's, quinolones, tetracycline and Septra. During the stay in the ER she was seen by vein and vascular they noted that he does not have any further options for revascularization. They do not think that he could undergo any foot conserving surgery therefore the only thing left here would be a BKA The area of the heel that I debrided last time he is here actually looks quite healthy nice healthy looking granulation tissue over perhaps 60 to 70% of the wound still 30% slough we have been using Iodoflex. They have been using silver collagen to the toe The other issue here is the daughter is struggling with the idea of in-home hospice. I went over this with her. I emphasized that hospice is not a home care agency as a velocity of care at the end of life. Patient is 87 years old he apparently is eating and drinking less and since I have known him he has become more frail 03/11/2020 upon evaluation  today patient unfortunately is showing signs of progression in regard to his right great toe. Currently he is actually experiencing increased evidence of skin deterioration and in fact this toe is developing into more of a dry gangrene issue at this point. He was signed up for hospice on Thursday and then apparently hospice was called off on Friday due to the fact that they were talking about doing wet-to-dry  dressings to both wound locations and his daughter was really concerned about this. She did not want anything to make the wounds progress faster than they should be. With that being said I think a wet-to-dry dressing would be okay for the heel location. I do not believe this would cause any harm. With that being said and in regard to the toe I really do agree I would not really want a wet-to-dry dressing here I think more of like Betadine and dry gauze dressing could be a appropriate way to go after the toe was painted with Betadine that is. 12/9. It has been a month since I saw this man. He was using Betadine to the great toe and Iodoflex to the area on the heel. The heel actually look quite good last time I saw this this is gotten larger and not nearly as healthy looking as last time. Moreover the right first toe wound now has dry gangrene Since they were last here they have not elected to sign up for hospice. They do not have home health. They are trying to get some home care through the [facility] 1/13; the patient's right first toe is mummified to the inter phalangeal joint. The area on the plantar aspect of his right heel actually measures quite a bit better. We have been using Iodoflex His daughter points out an area on the plantar aspect of the left 5th toe at roughly the level of the proximal inner phalangeal Objective Constitutional Sitting or standing Blood Pressure is within target range for patient.. Pulse regular and within target range for patient.Marland Kitchen Respirations regular, non-labored  and within target range.. Temperature is normal and within the target range for the patient.Marland Kitchen Appears in no distress. Vitals Time Taken: 1:09 PM, Height: 70 in, Weight: 140 lbs, BMI: 20.1, Temperature: 97.4 F, Pulse: 77 bpm, Respiratory Rate: 16 breaths/min, Blood Pressure: 110/58 mmHg. Cardiovascular Pedal pulses absent bilaterally.. General Notes: Wound exam; the patient's right first toe is mummified to the interphalangeal joint however it does not look infected and it does not cause him pain oo The area on the right plantar heel looks clean edges fairly thick although I did not debride this today. Surface area is smaller. oo On the plantar part of the left fifth toe likely secondary to pressure from a severe hammer deformity there is a small open area. I think this will heal with relief of the pressure. He does seem to have a palpable pulse on the left foot Integumentary (Hair, Skin) Wound #2 status is Open. Original cause of wound was Gradually Appeared. The wound is located on the Right T Great. The wound measures 4.5cm length x oe 5.5cm width x 0.1cm depth; 19.439cm^2 area and 1.944cm^3 volume. There is no tunneling or undermining noted. There is a none present amount of drainage noted. The wound margin is indistinct and nonvisible. There is no granulation within the wound bed. There is a large (67-100%) amount of necrotic tissue within the wound bed including Eschar. Wound #3 status is Open. Original cause of wound was Gradually Appeared. The wound is located on the Right Calcaneus. The wound measures 2cm length x 3.8cm width x 0.1cm depth; 5.969cm^2 area and 0.597cm^3 volume. There is Fat Layer (Subcutaneous Tissue) exposed. There is no tunneling or undermining noted. There is a medium amount of serosanguineous drainage noted. The wound margin is flat and intact. There is medium (34-66%) pink granulation within the wound bed. There is a medium (34-66%) amount of necrotic tissue within  the wound bed including Adherent Slough. Wound #6 status is Open. Original cause of wound was Pressure Injury. The wound is located on the Left T Fifth. The wound measures 0.3cm length x 0.3cm oe width x 0.1cm depth; 0.071cm^2 area and 0.007cm^3 volume. There is Fat Layer (Subcutaneous Tissue) exposed. There is no tunneling or undermining noted. There is a medium amount of serosanguineous drainage noted. The wound margin is distinct with the outline attached to the wound base. There is large (67- 100%) pink granulation within the wound bed. There is no necrotic tissue within the wound bed. 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 Bathing/ Shower/ Hygiene: May shower and wash wound with soap and water. - with dressing changes Edema Control - Lymphedema / SCD / Other: Elevate legs to the level of the heart or above for 30 minutes daily and/or when sitting, a frequency of: Avoid standing for long periods of time. Moisturize legs daily. Home Health: New wound care orders this week; continue Home Health for wound care. May utilize formulary equivalent dressing for wound treatment orders unless otherwise specified. - Advance home health to change dressings weekly. WOUND #2: - T Great Wound Laterality: Right oe Cleanser: Soap and Water (Home Health) 1 x Per Day/30 Days Discharge Instructions: May shower and wash wound with dial antibacterial soap and water prior to dressing change. Prim Dressing: betadine (Home Health) 1 x Per Day/30 Days ary Discharge Instructions: to toe daily WOUND #3: - Calcaneus  Wound Laterality: Right Cleanser: Soap and Water [employer]) Every Other Day/30 Days Discharge Instructions: May shower and wash wound with dial antibacterial soap and water prior to dressing change. Prim Dressing: FIBRACOL Plus Dressing, 2x2 in (collagen) [facility]) Every Other Day/30 Days ary Discharge Instructions: home health to supply hydrogel if possible. Moisten collagen with saline or hydrogel Secondary Dressing: ALLEVYN Heel 4 1/2in x 5 1/2in / 10.5cm x 13.5cm [facility]) Every Other Day/30 Days Discharge Instructions: Apply over primary dressing as directed. Secured With: [employer], 4.5x3.1 (in/yd) (Generic) Every Other Day/30 Days Discharge Instructions: Secure with Kerlix as directed. Secured With: Paper T ape, 2x10 (in/yd) (Generic) Every Other Day/30 Days Discharge Instructions: Secure dressing with tape as directed. WOUND #6: - T Fifth Wound Laterality: Left oe Prim Dressing: Maxorb Extra Calcium Alginate 2x2 in [facility]) Every Other Day/30 Days ary Discharge Instructions: Apply calcium alginate to wound bed as instructed Secondary Dressing: Woven Gauze Sponges 2x2 in [facility]) Every Other Day/30 Days Discharge Instructions: Apply over primary dressing as directed. Secured With: Child psychotherapist, Sterile 2x75 (in/in) (Home Health) Every Other Day/30 Days Discharge Instructions: Ensure to take ensure the toe is does not bend. .Secure with stretch gauze as directed. #1 I am continuing with the Betadine to the right great toe. There is no need to urgently do anything about this there is no infection and certainly no pain 2. I change the dressing to the right heel the silver collagen the area looks dry 3. I put some silver alginate in the tight contracture of the hammer deformity of the left fifth toe if we keep this separated I think this should  heal up Electronic Signature(s) Signed: 05/02/2020 5:01:02 PM By: Linton Ham MD Entered  By: Linton Ham on 05/02/2020 13:48:01 -------------------------------------------------------------------------------- SuperBill Details Patient Name: Date of Service: [name] 05/02/2020 Medical Record Number: [id number] Patient Account Number: [id number] Date of Birth/Sex: Treating RN: [birth year] (87 y.o. Lorette Ang, Tammi Klippel Primary Care Provider: Sherrie Mustache Other Clinician: Referring Provider: Treating Provider/Extender: Deloria Lair in Treatment: 40 Diagnosis Coding ICD-10 Codes Code Description E11.621 Type 2 diabetes mellitus with foot ulcer L97.514 Non-pressure chronic ulcer of other part of right foot with necrosis of bone E11.40 Type 2 diabetes mellitus with diabetic neuropathy, unspecified L97.412 Non-pressure chronic ulcer of right heel and midfoot with fat layer exposed L97.521 Non-pressure chronic ulcer of other part of left foot limited to breakdown of skin Facility Procedures CPT4 Code: [id number] Description: [phone] - WOUND CARE VISIT-LEV 5 EST PT Modifier: Quantity: 1 Physician Procedures : CPT4 Code Description Modifier [id number] - WC PHYS LEVEL 3 - EST PT ICD-10 Diagnosis Description L97.514 Non-pressure chronic ulcer of other part of right foot with necrosis of bone L97.521 Non-pressure chronic ulcer of other part of left foot  limited to breakdown of skin Quantity: 1 Electronic Signature(s) Signed: 05/02/2020 5:01:02 PM By: Linton Ham MD Entered By: Linton Ham on 05/02/2020 13:48:29

## 2020-05-03 ENCOUNTER — Inpatient Hospital Stay (HOSPITAL_COMMUNITY): Payer: Medicare Other | Attending: Hematology

## 2020-05-03 ENCOUNTER — Other Ambulatory Visit: Payer: Self-pay

## 2020-05-03 DIAGNOSIS — D472 Monoclonal gammopathy: Secondary | ICD-10-CM | POA: Insufficient documentation

## 2020-05-03 DIAGNOSIS — Z800 Family history of malignant neoplasm of digestive organs: Secondary | ICD-10-CM | POA: Insufficient documentation

## 2020-05-03 DIAGNOSIS — I4891 Unspecified atrial fibrillation: Secondary | ICD-10-CM | POA: Insufficient documentation

## 2020-05-03 DIAGNOSIS — Z803 Family history of malignant neoplasm of breast: Secondary | ICD-10-CM | POA: Insufficient documentation

## 2020-05-03 DIAGNOSIS — C61 Malignant neoplasm of prostate: Secondary | ICD-10-CM | POA: Insufficient documentation

## 2020-05-03 DIAGNOSIS — E119 Type 2 diabetes mellitus without complications: Secondary | ICD-10-CM | POA: Diagnosis not present

## 2020-05-03 DIAGNOSIS — Z7901 Long term (current) use of anticoagulants: Secondary | ICD-10-CM | POA: Diagnosis not present

## 2020-05-03 DIAGNOSIS — Z8249 Family history of ischemic heart disease and other diseases of the circulatory system: Secondary | ICD-10-CM | POA: Insufficient documentation

## 2020-05-03 DIAGNOSIS — Z8509 Personal history of malignant neoplasm of other digestive organs: Secondary | ICD-10-CM | POA: Diagnosis not present

## 2020-05-03 DIAGNOSIS — Z808 Family history of malignant neoplasm of other organs or systems: Secondary | ICD-10-CM | POA: Diagnosis not present

## 2020-05-03 DIAGNOSIS — Z8572 Personal history of non-Hodgkin lymphomas: Secondary | ICD-10-CM | POA: Insufficient documentation

## 2020-05-03 DIAGNOSIS — C7951 Secondary malignant neoplasm of bone: Secondary | ICD-10-CM | POA: Insufficient documentation

## 2020-05-03 DIAGNOSIS — I10 Essential (primary) hypertension: Secondary | ICD-10-CM | POA: Insufficient documentation

## 2020-05-03 DIAGNOSIS — Z79899 Other long term (current) drug therapy: Secondary | ICD-10-CM | POA: Diagnosis not present

## 2020-05-03 LAB — CBC WITH DIFFERENTIAL/PLATELET
Abs Immature Granulocytes: 0.15 10*3/uL — ABNORMAL HIGH (ref 0.00–0.07)
Basophils Absolute: 0 10*3/uL (ref 0.0–0.1)
Basophils Relative: 0 %
Eosinophils Absolute: 0.2 10*3/uL (ref 0.0–0.5)
Eosinophils Relative: 2 %
HCT: 31.9 % — ABNORMAL LOW (ref 39.0–52.0)
Hemoglobin: 10 g/dL — ABNORMAL LOW (ref 13.0–17.0)
Immature Granulocytes: 2 %
Lymphocytes Relative: 30 %
Lymphs Abs: 2.1 10*3/uL (ref 0.7–4.0)
MCH: 29.2 pg (ref 26.0–34.0)
MCHC: 31.3 g/dL (ref 30.0–36.0)
MCV: 93 fL (ref 80.0–100.0)
Monocytes Absolute: 0.9 10*3/uL (ref 0.1–1.0)
Monocytes Relative: 12 %
Neutro Abs: 3.8 10*3/uL (ref 1.7–7.7)
Neutrophils Relative %: 54 %
Platelets: 215 10*3/uL (ref 150–400)
RBC: 3.43 MIL/uL — ABNORMAL LOW (ref 4.22–5.81)
RDW: 15.3 % (ref 11.5–15.5)
WBC: 7.2 10*3/uL (ref 4.0–10.5)
nRBC: 0 % (ref 0.0–0.2)

## 2020-05-03 LAB — COMPREHENSIVE METABOLIC PANEL
ALT: 19 U/L (ref 0–44)
AST: 17 U/L (ref 15–41)
Albumin: 3.3 g/dL — ABNORMAL LOW (ref 3.5–5.0)
Alkaline Phosphatase: 59 U/L (ref 38–126)
Anion gap: 11 (ref 5–15)
BUN: 30 mg/dL — ABNORMAL HIGH (ref 8–23)
CO2: 25 mmol/L (ref 22–32)
Calcium: 10.4 mg/dL — ABNORMAL HIGH (ref 8.9–10.3)
Chloride: 102 mmol/L (ref 98–111)
Creatinine, Ser: 1.12 mg/dL (ref 0.61–1.24)
GFR, Estimated: >60 mL/min (ref >60)
Glucose, Bld: 124 mg/dL — ABNORMAL HIGH (ref 70–99)
Potassium: 4.5 mmol/L (ref 3.5–5.1)
Sodium: 138 mmol/L (ref 135–145)
Total Bilirubin: 0.7 mg/dL (ref 0.3–1.2)
Total Protein: 6.7 g/dL (ref 6.5–8.1)

## 2020-05-03 LAB — PSA: Prostatic Specific Antigen: <0.01 ng/mL (ref 0.00–4.00)

## 2020-05-06 ENCOUNTER — Other Ambulatory Visit (HOSPITAL_COMMUNITY): Payer: Medicare Other

## 2020-05-07 ENCOUNTER — Encounter (HOSPITAL_COMMUNITY): Payer: Self-pay

## 2020-05-07 ENCOUNTER — Inpatient Hospital Stay (HOSPITAL_BASED_OUTPATIENT_CLINIC_OR_DEPARTMENT_OTHER): Payer: Medicare Other | Admitting: Hematology

## 2020-05-07 ENCOUNTER — Inpatient Hospital Stay (HOSPITAL_COMMUNITY): Payer: Medicare Other

## 2020-05-07 VITALS — BP 129/59 | HR 73 | Temp 96.8°F | Resp 20 | Wt 127.4 lb

## 2020-05-07 DIAGNOSIS — C61 Malignant neoplasm of prostate: Secondary | ICD-10-CM | POA: Diagnosis not present

## 2020-05-07 DIAGNOSIS — Z8509 Personal history of malignant neoplasm of other digestive organs: Secondary | ICD-10-CM | POA: Diagnosis not present

## 2020-05-07 DIAGNOSIS — Z8572 Personal history of non-Hodgkin lymphomas: Secondary | ICD-10-CM | POA: Diagnosis not present

## 2020-05-07 DIAGNOSIS — D472 Monoclonal gammopathy: Secondary | ICD-10-CM | POA: Diagnosis not present

## 2020-05-07 DIAGNOSIS — E119 Type 2 diabetes mellitus without complications: Secondary | ICD-10-CM | POA: Diagnosis not present

## 2020-05-07 DIAGNOSIS — C7951 Secondary malignant neoplasm of bone: Secondary | ICD-10-CM

## 2020-05-07 MED ORDER — DENOSUMAB 120 MG/1.7ML ~~LOC~~ SOLN
120.0000 mg | Freq: Once | SUBCUTANEOUS | Status: AC
  Administered 2020-05-07: 120 mg via SUBCUTANEOUS
  Filled 2020-05-07: qty 1.7

## 2020-05-07 NOTE — Progress Notes (Signed)
[name] [address], [name] [address]   CLINIC:  Medical Oncology/Hematology  PCP:  [name], NP [place]. 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  The Multi-Gene Panel offered by [facility] includes sequencing and/or deletion duplication testing of the following 80 genes: ALK, APC, ATM, AXIN2,BAP1,  BARD1, BLM, BMPR1A, BRCA1, BRCA2, BRIP1, CASR, CDC73, CDH1, CDK4, CDKN1B, CDKN1C, CDKN2A (p14ARF), CDKN2A (p16INK4a), CEBPA, CHEK2, CTNNA1, DICER1, DIS3L2, EGFR (c.2369C>T, p.Thr790Met variant only), EPCAM (Deletion/duplication testing only), FH, FLCN, GATA2, GPC3, GREM1 (Promoter region deletion/duplication testing only), HOXB13 (c.251G>A, p.Gly84Glu), HRAS, KIT, MAX, MEN1, MET, MITF (c.952G>A, p.Glu318Lys variant only), MLH1, MSH2, MSH3, MSH6, MUTYH, NBN, NF1, NF2, NTHL1, PALB2, PDGFRA, PHOX2B, PMS2, POLD1, POLE, POT1, PRKAR1A, PTCH1, PTEN, RAD50, RAD51C, RAD51D, RB1, RECQL4, RET, RUNX1, SDHAF2, SDHA (sequence changes only), SDHB, SDHC, SDHD, SMAD4, SMARCA4, SMARCB1, SMARCE1, STK11, SUFU, TERT, TERT, TMEM127, TP53, TSC1, TSC2, VHL, WRN and WT1.  The report date is February 08, 2017.    Prostate cancer ([facility])  09/08/2013 Initial Diagnosis   Prostate cancer ([facility])   02/08/2017 Genetic Testing   ATM Gain (Exons 62-63) VUS identified on the multi-gene panel.  The Multi-Gene Panel offered by [facility] includes sequencing and/or deletion duplication testing of the following 80 genes: ALK,  APC, ATM, AXIN2,BAP1,  BARD1, BLM, BMPR1A, BRCA1, BRCA2, BRIP1, CASR, CDC73, CDH1, CDK4, CDKN1B, CDKN1C, CDKN2A (p14ARF), CDKN2A (p16INK4a), CEBPA, CHEK2, CTNNA1, DICER1, DIS3L2, EGFR (c.2369C>T, p.Thr790Met variant only), EPCAM (Deletion/duplication testing only), FH, FLCN, GATA2, GPC3, GREM1 (Promoter region deletion/duplication testing only), HOXB13 (c.251G>A, p.Gly84Glu), HRAS, KIT, MAX, MEN1, MET, MITF (c.952G>A, p.Glu318Lys variant only), MLH1, MSH2, MSH3, MSH6, MUTYH, NBN, NF1, NF2, NTHL1, PALB2, PDGFRA, PHOX2B, PMS2, POLD1, POLE, POT1, PRKAR1A, PTCH1, PTEN, RAD50, RAD51C, RAD51D, RB1, RECQL4, RET, RUNX1, SDHAF2, SDHA (sequence changes only), SDHB, SDHC, SDHD, SMAD4, SMARCA4, SMARCB1, SMARCE1, STK11, SUFU, TERT, TERT, TMEM127, TP53, TSC1, TSC2, VHL, WRN and WT1.  The report date is February 08, 2017.      CANCER STAGING: Cancer Staging NHL (non-Hodgkin's lymphoma) (HCC) Staging form: Lymphoid Neoplasms, AJCC 6th Edition - Clinical: Stage III - Signed by Baird Cancer, PA on 08/18/2011   INTERVAL HISTORY:  [name], a 87 y.o. male, returns for routine follow-up of his metastatic prostate cancer to bones. [name] was last seen on 01/29/2020.   Today he is accompanied by his daughter and he reports feeling okay. His energy levels are noticeably decreased. He continues having wounds in his toes as well as pneumonia diagnosed on 12/28 and finished Levaquin on 1/14 with resolution of cough. He stopped Zytiga when he developed pneumonia, though he continues taking prednisone prednisone 2.5 mg BID. He also complains of having pain in his lower back due to pressure sores. His appetite has come down significantly and he has to be forced and questioned constantly to eat; he drinks about 2 cans of Ensure daily.  He ambulates with a walker at home.    REVIEW OF SYSTEMS:  Review of Systems  Constitutional: Positive for appetite change (25%)  and fatigue (25%).  Respiratory: Negative for  cough.   Skin: Positive for wound (in toes & sacrum pressure sores).  All other systems reviewed and are negative.   PAST MEDICAL/SURGICAL HISTORY:  Past Medical History:  Diagnosis Date  . Acute bronchitis   . Acute lower UTI 11/25/2017  . Amputation of toe of left foot ([facility]) 07/30/2017, 08/27/2017    Great toe 07/30/2017 Second toe 08/27/2017   . Arthritis   . Aspiration pneumonia ([facility]) 01/31/2018  . Atrial fibrillation ([facility])    Dr. Harl Bowie- [facility] follows saw 11'14  . Bladder stones    tx. with oral meds and antibiotics, now surgery planned  . Bone metastases ([facility]) 08/21/2015  . Cataracts, both eyes    surgery planned May 2015  . Community acquired pneumonia 04/16/2018  . Cystoid macular edema 04/21/2015    Right eye 04/21/2015   . Dehydration 04/16/2018  . Dementia without behavioral disturbance ([facility]) 04/16/2018  . Diabetes mellitus without complication ([facility])    Type II  . Diarrhea 11/25/2017  . Dyspnea    with activity  . Dysrhythmia    Afib  . Family history of breast cancer   . Family history of colon cancer   . Genetic testing 02/09/2017  . GERD (gastroesophageal reflux disease)   . GIST (gastrointestinal stromal tumor), malignant ([facility]) 2005   Gastrointestinal stromal tumor, that is GIST, small bowel, 4.5 cm, intermediate prognostic grade found on the PET scan in the small bowel, accounting for that small bowel activity in October 2005 with resection by Dr. Margot Chimes, thus far without recurrence.   Marland Kitchen History of MRI of lumbar spine 03/05/2014  . Hypertension   . Hypertrophy of prostate with urinary obstruction   . Hypomagnesemia 03/16/2017  . Metabolic encephalopathy 93/57/0177  . Metastatic adenocarcinoma to prostate ([facility]) 09/08/2013  . 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 NHL (non-Hodgkin's lymphoma) ([facility]) 2005   Diffuse large B-cell lymphoma, clinically stage IIIA, CD20 positive, status post cervical lymph node biopsy 08/03/2003 on the left. PET  scan was also positive in the spleen and small bowel region, but bone marrow aspiration and biopsy were negative. So he essentially had stage IIIAs. He received R-CHOP x6 cycles with CR established by PET scan criteria on 11/23/2003 with no evidence for relapse th  . Numbness 02/19/2017   Per [facility] new patient packet  . Osteomyelitis ([facility]) 06/29/2017   toe of left foot   . PAD (peripheral artery disease) ([facility]) 08/27/2017  . Polyneuropathy 02/19/2017  . Postural dizziness with presyncope 02/19/2017  . S/P TURP 08/10/2013  . Sepsis ([facility]) 07/02/2018  . Skin cancer    Basal cell- face,head, neck,  arms, legs, Back  . Urinary frequency 02/19/2017   Past Surgical History:  Procedure Laterality Date  . ABDOMINAL AORTOGRAM W/LOWER EXTREMITY N/A 09/20/2019   Procedure: ABDOMINAL AORTOGRAM W/LOWER EXTREMITY;  Surgeon: Marty Heck, MD;  Location: [facility];  Service: Cardiovascular;  Laterality: N/A;  . AMPUTATION Left 06/29/2017   Procedure: AMPUTATION LEFT GREAT TOE;  Surgeon: [name], MD;  Location: [facility];  Service: Vascular;  Laterality: Left;  . AMPUTATION Left 08/27/2017   Procedure: AMPUTATION Left SECOND TOE;  Surgeon: [name], MD;  Location: [facility];  Service: Vascular;  Laterality: Left;  . CATARACT EXTRACTION Bilateral 2015  . CHOLECYSTECTOMY    . COLON RESECTION     small bowel  . CYSTOSCOPY WITH LITHOLAPAXY N/A 08/10/2013   Procedure: CYSTOSCOPY WITH LITHOLAPAXY WITH  [name];  Surgeon: [name], MD;  [facility];  Service: Urology;  Laterality: N/A;  . ESOPHAGEAL DILATION N/A 02/27/2016   Procedure: ESOPHAGEAL DILATION;  Surgeon: [name], MD;  [facility];  Service: Endoscopy;  Laterality: N/A;  . ESOPHAGEAL DILATION N/A 07/07/2018   Procedure: ESOPHAGEAL DILATION;  Surgeon: [name], MD;  [facility];  Service: Endoscopy;  Laterality: N/A;  . ESOPHAGOGASTRODUODENOSCOPY N/A 02/27/2016   Procedure:  ESOPHAGOGASTRODUODENOSCOPY (EGD);  Surgeon: [name], MD;  [facility];  Service: Endoscopy;  Laterality: N/A;  12:00  . ESOPHAGOGASTRODUODENOSCOPY N/A 07/07/2018   Procedure: ESOPHAGOGASTRODUODENOSCOPY (EGD);  Surgeon: [name], MD;  [facility];  Service: Endoscopy;  Laterality: N/A;  . INCISION AND DRAINAGE PERIRECTAL ABSCESS    . LOWER EXTREMITY ANGIOGRAPHY N/A 06/25/2017   Procedure: LOWER EXTREMITY ANGIOGRAPHY;  Surgeon: [name], MD;  Location: [facility];  Service: Cardiovascular;  Laterality: N/A;  . LYMPH NODE DISSECTION Left    '05-neck  . PERIPHERAL VASCULAR ATHERECTOMY Right 09/20/2019   Procedure: PERIPHERAL VASCULAR ATHERECTOMY;  Surgeon: Marty Heck, MD;  Location: [facility];  Service: Cardiovascular;  Laterality: Right;  Posterior tibial  . PORT-A-CATH REMOVAL    . PORTACATH PLACEMENT     insertion and removal -last chemotherapy 10 yrs ago  . TRANSURETHRAL RESECTION OF PROSTATE N/A 08/10/2013   Procedure: TRANSURETHRAL RESECTION OF THE PROSTATE WITH GYRUS INSTRUMENTS;  Surgeon: [name], MD;  [facility];  Service: Urology;  Laterality: N/A;    SOCIAL HISTORY:  Social History   Socioeconomic History  . Marital status: Married    Spouse name: Not on file  . Number of children: Not on file  . Years of education: Not on file  . Highest education level: Not on file  Occupational History  . Occupation: retired    Fish farm manager: RETIRED    Comment: salesman  Tobacco Use  . Smoking status: Never Smoker  . Smokeless tobacco: Never Used  Vaping Use  . Vaping Use: Never used  Substance and Sexual Activity  . Alcohol use: No  . Drug use: No  . Sexual activity: Not Currently  Other Topics Concern  . Not on file  Social History Narrative   Diet: Soft foods      Do you drink/eat things with caffeine No      Marital Status: Marred   What year were you married? 1949      Do you live in a  house, apartment, assisted living, condo, trailer, etc.? House      Is it one or more stories? One      How many persons live in your home? 2         Do you have any pets in your home?(please list) No      Highest level of education completed: 9th      Current or past profession: [place] you exercise? No Type and how often:      Living Will? Yes      DNR form? NO  If not do you wish to discuss one? Yes      POA/HPOA forms? Yes      Difficulty bathing or dressing yourself? Yes      Difficulty preparing food or eating? Yes      Difficulty managing medications? Yes      Difficulty managing your finances? Yes  Difficulty affording your medications? No                     Social Determinants of Radio broadcast assistant Strain: Not on file  Food Insecurity: Not on file  Transportation Needs: No Transportation Needs  . Lack of Transportation (Medical): No  . Lack of Transportation (Non-Medical): No  Physical Activity: Not on file  Stress: Not on file  Social Connections: Not on file  Intimate Partner Violence: Not on file    FAMILY HISTORY:  Family History  Problem Relation Age of Onset  . Heart attack Mother        died in her 66s  . Other Father 18       pneumonia - died  . Breast cancer Sister        dx in her 43s-60s  . Colon cancer Brother        dx in his 30s  . Cancer Sister        TAH/BSO due to "male cancer"  . Breast cancer Daughter 78  . Breast cancer Daughter 62       DCIS - ATM VUS on [facility] 83 gene panel in 2018  . Breast cancer Daughter 58       DCIS - Negative on [facility] MyRisk 25 gene apnel in 2015  . Thyroid cancer Daughter 73       Medullary and Papillary  . Thyroid nodules Grandchild     CURRENT MEDICATIONS:  Current Outpatient Medications  Medication Sig Dispense Refill  . abiraterone acetate (ZYTIGA) 250 MG tablet Take 1 tablet (250 mg total) by mouth daily before breakfast. Take on an empty stomach 1  hour before or 2 hours after a meal 120 tablet 0  . acetaminophen (TYLENOL) 500 MG tablet Take 500 mg by mouth at bedtime.    Marland Kitchen albuterol (2.5 MG/3ML) 0.083% NEBU 3 mL, albuterol (5 MG/ML) 0.5% NEBU 0.5 mL Inhale into the lungs 3 (three) times daily as needed.    Marland Kitchen apixaban (ELIQUIS) 5 MG TABS tablet Take 1 tablet (5 mg total) by mouth 2 (two) times daily. 180 tablet 3  . Aspirin 81 MG CAPS Take 81 mg by mouth daily.     . blood glucose meter kit and supplies Dispense based on patient and insurance preference. Use four times daily as directed. (FOR ICD-10 E10.9, E11.9). 1 each 0  . Cholecalciferol (VITAMIN D3) 1000 units CAPS Take 1,000 Units by mouth daily.     . Cranberry 500 MG TABS Take 500 mg by mouth daily.     Marland Kitchen denosumab (XGEVA) 120 MG/1.7ML SOLN injection Inject 120 mg into the skin every 30 (thirty) days.    . Dextromethorphan-guaiFENesin (MUCINEX DM PO) Take by mouth 2 (two) times daily.    . diclofenac Sodium (VOLTAREN) 1 % GEL Apply 2 g topically 4 (four) times daily. 150 g 0  . diltiazem (CARDIZEM SR) 90 MG 12 hr capsule Take 1 capsule (90 mg total) by mouth 2 (two) times daily. 60 capsule 11  . donepezil (ARICEPT) 10 MG tablet Take 1 tablet (10 mg total) by mouth daily. 90 tablet 1  . furosemide (LASIX) 20 MG tablet Take 0.5 tablets (10 mg total) by mouth daily. Take for 5 day, then change to as needed for shortness of breath for pleural effusion. 30 tablet 0  . glipiZIDE (GLUCOTROL XL) 5 MG 24 hr tablet Take 1 tablet (5 mg total) by mouth daily with breakfast. 90  tablet 1  . glucose blood (EASYMAX TEST) test strip Use as instructed 100 each 12  . Lancets 28G MISC 1 kit by Does not apply route every morning. 100 each 1  . Leuprolide Acetate, 6 Month, (LUPRON DEPOT, 61-MONTH,) 45 MG injection Inject 45 mg into the muscle every 6 (six) months.    . metFORMIN (GLUCOPHAGE) 850 MG tablet Take 850 mg by mouth 2 (two) times daily with a meal.     . Multiple Vitamin (MULTIVITAMIN WITH  MINERALS) TABS tablet Take 1 tablet by mouth daily.     . predniSONE (DELTASONE) 5 MG tablet Take 2.5 mg by mouth 2 (two) times daily.     . Probiotic Product (PROBIOTIC DAILY PO) Take 1 tablet by mouth daily. Every morning     . simethicone (MYLICON) 80 MG chewable tablet Chew 80 mg by mouth daily.    . cephALEXin (KEFLEX) 250 MG capsule Take 250 mg by mouth daily at 12 noon. Preventative for UTI prescribed by urology    . lisinopril (ZESTRIL) 20 MG tablet Take 1 tablet (20 mg total) by mouth in the morning and at bedtime. 60 tablet 6  . mirtazapine (REMERON) 15 MG tablet Take 1 tablet (15 mg total) by mouth at bedtime. 90 tablet 1  . traMADol (ULTRAM) 50 MG tablet Take 1 tablet (50 mg total) by mouth every 6 (six) hours as needed for moderate pain. (Patient not taking: Reported on 05/07/2020) 30 tablet 0   No current facility-administered medications for this visit.    ALLERGIES:  Allergies  Allergen Reactions  . Tape Other (See Comments)    SKIN IS VERY THIN AND WILL TEAR AND BRUISE VERY EASILY!!!!  . Augmentin [Amoxicillin-Pot Clavulanate] Rash and Other (See Comments)    Redness of skin    PHYSICAL EXAM:  Performance status (ECOG): 1 - Symptomatic but completely ambulatory  Vitals:   05/07/20 1325  BP: (!) 129/59  Pulse: 73  Resp: 20  Temp: (!) 96.8 F (36 C)   Wt Readings from Last 3 Encounters:  05/07/20 127 lb 6.4 oz (57.8 kg)  04/26/20 127 lb 6.4 oz (57.8 kg)  02/22/20 140 lb (63.5 kg)   Physical Exam Vitals reviewed.  Constitutional:      Appearance: Normal appearance.     Comments: In wheelchair  Cardiovascular:     Rate and Rhythm: Normal rate and regular rhythm.     Pulses: Normal pulses.     Heart sounds: Normal heart sounds.  Pulmonary:     Effort: Pulmonary effort is normal.     Breath sounds: Normal breath sounds.  Neurological:     General: No focal deficit present.     Mental Status: He is alert and oriented to person, place, and time.   Psychiatric:        Mood and Affect: Mood normal.        Behavior: Behavior normal.      LABORATORY DATA:  I have reviewed the labs as listed.  CBC Latest Ref Rng & Units 05/03/2020 02/22/2020 01/26/2020  WBC 4.0 - 10.5 K/uL 7.2 8.3 8.5  Hemoglobin 13.0 - 17.0 g/dL 10.0(L) 9.5(L) 11.1(L)  Hematocrit 39.0 - 52.0 % 31.9(L) 29.2(L) 33.9(L)  Platelets 150 - 400 K/uL 215 268 179   CMP Latest Ref Rng & Units 05/03/2020 03/25/2020 02/22/2020  Glucose 70 - 99 mg/dL 124(H) 167(H) 96  BUN 8 - 23 mg/dL 30(H) 19 23  Creatinine 0.61 - 1.24 mg/dL 1.12 1.01 1.18  Sodium  135 - 145 mmol/L 138 136 135  Potassium 3.5 - 5.1 mmol/L 4.5 4.4 4.7  Chloride 98 - 111 mmol/L 102 102 103  CO2 22 - 32 mmol/L _0 Calcium 8.9 - 10.3 mg/dL 10.4(H) 9.2 10.4(H)  Total Protein 6.5 - 8.1 g/dL 6.7 6.2(L) 6.3(L)  Total Bilirubin 0.3 - 1.2 mg/dL 0.7 0.3 0.7  Alkaline Phos 38 - 126 U/L 59 75 69  AST 15 - 41 U/L 17 12(L) 14(L)  ALT 0 - 44 U/L _1 PSA <0.01 05/03/2020  PSA <0.01 01/26/2020  PSA <0.01 12/28/2019    DIAGNOSTIC IMAGING:  I have independently reviewed the scans and discussed with the patient. DG Chest 2 View  Result Date: 04/16/2020 CLINICAL DATA:  87 year old male with cough EXAM: CHEST - 2 VIEW COMPARISON:  02/22/2020 FINDINGS: Cardiomediastinal silhouette unchanged in size and contour. New meniscus overlying the right hemithorax with blunting on the costophrenic sulcus lateral view. Reticular opacities at the lung base on the lateral view corresponding to findings on the frontal. No pneumothorax. Coarsened interstitial markings. No displaced fracture IMPRESSION: New right-sided pleural effusion with reticular opacities at the lung base, presumed to be pneumonia and parapneumonic effusion. Electronically Signed   By: Corrie Mckusick D.O.   On: 04/16/2020 15:25     ASSESSMENT:  1. Metastatic prostate cancer to the bones: -Abiraterone and prednisone started in October 2016. -Bone scan on Aug 27, 2017 shows interval improvement in the T12 lesions. No new lesions. -Abiraterone dose reduced 100 mg daily and prednisone 2.5 mg daily on February 10, 2018. -Abiraterone was held for a month but his symptoms of abdominal and leg pains did not improve. His energy levels also did not improve. -He was started back on abiraterone 500 mg daily on August 09, 2019 along with prednisone. -Current dose of Zytiga is to 50 mg daily.  2. IgM MGUS: -SPEP on January 25, 2019 shows M spike 0.4 g. Light chain ratio is 0.91.   PLAN:  1. Metastatic prostate cancer to the bones: -He has unhealed wound on the right great toe.  He also had recent pneumonia from which she is recovering. - His PSA is undetectable on 05/04/2019. - Last Lupron 45 mg was on 03/25/2020. - Upon further discussion, we have decided to discontinue Abiraterone at this time so that we can wean him off of prednisone. - He will take prednisone 2.5 mg once daily for 4 weeks and discontinue it. - RTC 3 months for follow-up.  2.Bone metastasis: -Calcium today is 10.4.  He will receive denosumab today. - We will hold off on denosumab until next visit in 3 months.   Orders placed this encounter:  No orders of the defined types were placed in this encounter.    [name], MD [facility] [phone]   I, Milinda Antis, am acting as a scribe for Dr. Sanda Linger.  I, [name] MD, have reviewed the above documentation for accuracy and completeness, and I agree with the above.

## 2020-05-07 NOTE — Progress Notes (Signed)
[name] presents today for office visit and  injection per the provider's orders.  Xgeva administered to Right Lower Abdomen without incident; injection site WNL; see MAR for injection details.  Patient tolerated procedure well and without incident.  No questions or complaints noted at this time.  Patient discharged in stable condition via wheelchair with family member.

## 2020-05-07 NOTE — Patient Instructions (Signed)
[place] at [facility] Discharge Instructions  You were seen today by Dr. Delton Coombes. He went over your recent results. You received your Xgeva injection today. Start taking 1/2 tablet of prednisone (2.5 mg) once daily for 4 weeks, then stop taking it altogether. Do not restart the Zytiga tablets. Dr. Delton Coombes will see you back in 3 months for labs and follow up.   Thank you for choosing [facility] at [facility] to provide your oncology and hematology care.  To afford each patient quality time with our provider, please arrive at least 15 minutes before your scheduled appointment time.   If you have a lab appointment with the [facility] please come in thru the Main Entrance and check in at the main information desk  You need to re-schedule your appointment should you arrive 10 or more minutes late.  We strive to give you quality time with our providers, and arriving late affects you and other patients whose appointments are after yours.  Also, if you no show three or more times for appointments you may be dismissed from the clinic at the providers discretion.     Again, thank you for choosing [facility].  Our hope is that these requests will decrease the amount of time that you wait before being seen by our physicians.       _____________________________________________________________  Should you have questions after your visit to [facility], please contact our office at [phone] between the hours of 8:00 a.m. and 4:30 p.m.  Voicemails left after 4:00 p.m. will not be returned until the following business day.  For prescription refill requests, have your pharmacy contact our office and allow 72 hours.    Cancer Center Support Programs:   > Cancer Support Group  2nd Tuesday of the month 1pm-2pm, Journey Room

## 2020-05-07 NOTE — Progress Notes (Signed)
Patient assessed and labs reviewed by Dr. Delton Coombes. Okay to proceed with Xgeva injection today. Primary nurse aware.

## 2020-05-07 NOTE — Progress Notes (Signed)
Pharmacy alerted to discontinue patient's Zytiga per Dr. Delton Coombes

## 2020-05-08 NOTE — Progress Notes (Addendum)
Cardiology Office Note    Date:  05/14/2020   ID:  [name], DOB 1923/07/10, MRN [id number]  PCP:  [name], NP  Cardiologist: Carlyle Dolly, MD EPS: None  Chief Complaint  Patient presents with  . Follow-up    History of Present Illness:  [name] is a 87 y.o. male with history of paroxysmal atrial fibrillation on Eliquis, hypertension, mild aortic stenosis on echo 01/2018, prostate cancer with bone mets.  Patient was last seen by Dr. Harl Bowie 11/2019.  Blood pressure was elevated and diltiazem was increased to 90 mg twice daily.  He recommended careful titration with history of orthostatic symptoms in the past and his age.  Patient comes in for f/u accompanied by his daughter. Has lost 11 lbs since last visit. BP running low today. She forgot to bring readings from home. Had pneumonia and was on antibiotics and now on soft foods. Has a nonhealing wound on right toe and heel and isn't candidate for surgery. Being followed by the wound center. Patient denies complaints. Occasionally gets dizzy when he stands up.    Past Medical History:  Diagnosis Date  . Acute bronchitis   . Acute lower UTI 11/25/2017  . Amputation of toe of left foot ([place]) 07/30/2017, 08/27/2017    Great toe 07/30/2017 Second toe 08/27/2017   . Arthritis   . Aspiration pneumonia ([place]) 01/31/2018  . Atrial fibrillation ([place])    Dr. Harl Bowie- [facility] follows saw 11'14  . Bladder stones    tx. with oral meds and antibiotics, now surgery planned  . Bone metastases ([place]) 08/21/2015  . Cataracts, both eyes    surgery planned May 2015  . Community acquired pneumonia 04/16/2018  . Cystoid macular edema 04/21/2015    Right eye 04/21/2015   . Dehydration 04/16/2018  . Dementia without behavioral disturbance ([place]) 04/16/2018  . Diabetes mellitus without complication ([place])    Type II  . Diarrhea 11/25/2017  . Dyspnea    with activity  . Dysrhythmia    Afib  . Family history of breast  cancer   . Family history of colon cancer   . Genetic testing 02/09/2017  . GERD (gastroesophageal reflux disease)   . 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 Margot Chimes, thus far without recurrence.   Marland Kitchen History of MRI of lumbar spine 03/05/2014  . Hypertension   . Hypertrophy of prostate with urinary obstruction   . Hypomagnesemia 03/16/2017  . Metabolic encephalopathy 88/41/6606  . Metastatic adenocarcinoma to prostate ([place]) 09/08/2013  . 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 NHL (non-Hodgkin's lymphoma) ([place]) 2005   Diffuse large B-cell lymphoma, clinically stage IIIA, CD20 positive, status post cervical lymph node biopsy 08/03/2003 on the left. PET scan was also positive in the spleen and small bowel region, but bone marrow aspiration and biopsy were negative. So he essentially had stage IIIAs. He received R-CHOP x6 cycles with CR established by PET scan criteria on 11/23/2003 with no evidence for relapse th  . Numbness 02/19/2017   Per [place] new patient packet  . Osteomyelitis ([place]) 06/29/2017   toe of left foot   . PAD (peripheral artery disease) ([place]) 08/27/2017  . Polyneuropathy 02/19/2017  . Postural dizziness with presyncope 02/19/2017  . S/P TURP 08/10/2013  .  Sepsis ([place]) 07/02/2018  . Skin cancer    Basal cell- face,head, neck,  arms, legs, Back  . Urinary frequency 02/19/2017    Past Surgical History:  Procedure Laterality Date  . ABDOMINAL AORTOGRAM W/LOWER EXTREMITY N/A 09/20/2019   Procedure: ABDOMINAL AORTOGRAM W/LOWER EXTREMITY;  Surgeon: Marty Heck, MD;  Location: [facility];  Service: Cardiovascular;  Laterality: N/A;  . AMPUTATION Left 06/29/2017   Procedure: AMPUTATION LEFT GREAT TOE;  Surgeon: [name], MD;  Location: [facility];  Service: Vascular;  Laterality: Left;  . AMPUTATION Left 08/27/2017   Procedure: AMPUTATION Left SECOND TOE;  Surgeon: [name], MD;  Location: [facility];  Service: Vascular;  Laterality: Left;  . CATARACT EXTRACTION Bilateral 2015  . CHOLECYSTECTOMY    . COLON RESECTION     small bowel  . CYSTOSCOPY WITH LITHOLAPAXY N/A 08/10/2013   Procedure: CYSTOSCOPY WITH LITHOLAPAXY WITH [name];  Surgeon: [name], MD;  [facility];  Service: Urology;  Laterality: N/A;  . ESOPHAGEAL DILATION N/A 02/27/2016   Procedure: ESOPHAGEAL DILATION;  Surgeon: [name], MD;  [facility];  Service: Endoscopy;  Laterality: N/A;  . ESOPHAGEAL DILATION N/A 07/07/2018   Procedure: ESOPHAGEAL DILATION;  Surgeon: [name], MD;  [facility];  Service: Endoscopy;  Laterality: N/A;  . ESOPHAGOGASTRODUODENOSCOPY N/A 02/27/2016   Procedure: ESOPHAGOGASTRODUODENOSCOPY (EGD);  Surgeon: [name], MD;  [facility];  Service: Endoscopy;  Laterality: N/A;  12:00  . ESOPHAGOGASTRODUODENOSCOPY N/A 07/07/2018   Procedure: ESOPHAGOGASTRODUODENOSCOPY (EGD);  Surgeon: [name], MD;  [facility];  Service: Endoscopy;  Laterality: N/A;  . INCISION AND DRAINAGE PERIRECTAL ABSCESS    . LOWER EXTREMITY ANGIOGRAPHY N/A 06/25/2017   Procedure: LOWER EXTREMITY ANGIOGRAPHY;  Surgeon: [name], MD;  Location: [facility];  Service: Cardiovascular;  Laterality: N/A;  . LYMPH NODE DISSECTION Left    '05-neck  . PERIPHERAL VASCULAR ATHERECTOMY Right 09/20/2019   Procedure: PERIPHERAL VASCULAR ATHERECTOMY;  Surgeon: Marty Heck, MD;  Location: [facility];  Service: Cardiovascular;  Laterality: Right;  Posterior tibial  . PORT-A-CATH REMOVAL    . PORTACATH PLACEMENT     insertion and removal -last chemotherapy 10 yrs ago  . TRANSURETHRAL RESECTION OF PROSTATE N/A 08/10/2013   Procedure:  TRANSURETHRAL RESECTION OF THE PROSTATE WITH GYRUS INSTRUMENTS;  Surgeon: [name], MD;  [facility];  Service: Urology;  Laterality: N/A;    Current Medications: Current Meds  Medication Sig  . acetaminophen (TYLENOL) 500 MG tablet Take 500 mg by mouth at bedtime.  Mar[place] Kitchen albuterol (2.5 MG/3ML) 0.083% NEBU 3 mL, albuterol (5 MG/ML) 0.5% NEBU 0.5 mL Inhale into the lungs 3 (three) times daily as needed.  Marland Kitchen apixaban (ELIQUIS) 5 MG TABS tablet Take 1 tablet (5 mg total) by mouth 2 (two) times daily.  . Aspirin 81 MG CAPS Take 81 mg by mouth daily.   . blood glucose meter kit and supplies Dispense based on patient and insurance preference. Use four times daily as directed. (FOR ICD-10 E10.9, E11.9).  . cephALEXin (KEFLEX) 250 MG capsule Take 250 mg by mouth at bedtime. Preventative for UTI prescribed by urology  . Cholecalciferol (VITAMIN D3) 1000 units CAPS Take 1,000 Units by mouth daily.   . Cranberry 500 MG TABS Take 500 mg by mouth daily.   Marland Kitchen denosumab (XGEVA) 120 MG/1.7ML SOLN injection Inject 120 mg into the skin every 30 (thirty)  days.  . Dextromethorphan-guaiFENesin (MUCINEX DM PO) Take by mouth 2 (two) times daily.  . diclofenac Sodium (VOLTAREN) 1 % GEL Apply 2 g topically 4 (four) times daily.  Mar[place] Kitchen diltiazem (CARDIZEM SR) 60 MG 12 hr capsule Take 1 capsule (60 mg total) by mouth 2 (two) times daily.  Marland Kitchen donepezil (ARICEPT) 10 MG tablet Take 1 tablet (10 mg total) by mouth daily.  Marland Kitchen glipiZIDE (GLUCOTROL XL) 5 MG 24 hr tablet Take 1 tablet (5 mg total) by mouth daily with breakfast.  . glucose blood (EASYMAX TEST) test strip Use as instructed  . Lancets 28G MISC 1 kit by Does not apply route every morning.  Marland Kitchen Leuprolide Acetate, 6 Month, (LUPRON DEPOT, 89-MONTH,) 45 MG injection Inject 45 mg into the muscle every 6 (six) months.  Marland Kitchen lisinopril (ZESTRIL) 20 MG tablet Take 1 tablet (20 mg total) by mouth in the morning and at bedtime.  . metFORMIN (GLUCOPHAGE) 850 MG tablet Take 850  mg by mouth 2 (two) times daily with a meal.   . mirtazapine (REMERON) 15 MG tablet Take 1 tablet (15 mg total) by mouth at bedtime.  . Multiple Vitamin (MULTIVITAMIN WITH MINERALS) TABS tablet Take 1 tablet by mouth daily.   . predniSONE (DELTASONE) 5 MG tablet Take 2.5 mg by mouth daily. To stop after 4 weeks  . Probiotic Product (PROBIOTIC DAILY PO) Take 1 tablet by mouth daily. Every morning   . simethicone (MYLICON) 80 MG chewable tablet Chew 80 mg by mouth daily.  . [DISCONTINUED] diltiazem (CARDIZEM SR) 90 MG 12 hr capsule Take 1 capsule (90 mg total) by mouth 2 (two) times daily.     Allergies:   Tape and Augmentin [amoxicillin-pot clavulanate]   Social History   Socioeconomic History  . Marital status: Married    Spouse name: Not on file  . Number of children: Not on file  . Years of education: Not on file  . Highest education level: Not on file  Occupational History  . Occupation: retired    Fish farm manager: RETIRED    Comment: salesman  Tobacco Use  . Smoking status: Never Smoker  . Smokeless tobacco: Never Used  Vaping Use  . Vaping Use: Never used  Substance and Sexual Activity  . Alcohol use: No  . Drug use: No  . Sexual activity: Not Currently  Other Topics Concern  . Not on file  Social History Narrative   Diet: Soft foods      Do you drink/eat things with caffeine No      Marital Status: Marred   What year were you married? 1949      Do you live in a house, apartment, assisted living, condo, trailer, etc.? House      Is it one or more stories? One      How many persons live in your home? 2         Do you have any pets in your home?(please list) No      Highest level of education completed: 9th      Current or past profession: [employer] you exercise? No Type and how often:      Living Will? Yes      DNR form? NO  If not do you wish to discuss one? Yes      POA/HPOA forms? Yes      Difficulty bathing or dressing yourself? Yes       Difficulty preparing food or eating? Yes  Difficulty managing medications? Yes      Difficulty managing your finances? Yes      Difficulty affording your medications? No                     Social Determinants of Radio broadcast assistant Strain: Not on file  Food Insecurity: Not on file  Transportation Needs: No Transportation Needs  . Lack of Transportation (Medical): No  . Lack of Transportation (Non-Medical): No  Physical Activity: Not on file  Stress: Not on file  Social Connections: Not on file     Family History:  The patient's family history includes Breast cancer in his sister; Breast cancer (age of onset: 61) in his daughter; Breast cancer (age of onset: 60) in his daughter; Breast cancer (age of onset: 63) in his daughter; Cancer in his sister; Colon cancer in his brother; Heart attack in his mother; Other (age of onset: 82) in his father; Thyroid cancer (age of onset: 69) in his daughter; Thyroid nodules in his grandchild.   ROS:   Please see the history of present illness.    ROS All other systems reviewed and are negative.   PHYSICAL EXAM:   VS:  BP 102/60   Pulse 70   Ht $R'5\' 10"'jT$  (1.778 m)   Wt 129 lb (58.5 kg)   BMI 18.51 kg/m   Physical Exam  GEN: Thin, elderly, in no acute distress  Neck: no JVD, carotid bruits, or masses Cardiac:RRR; 2/6 systolic murmur at the left sternal border Respiratory:  clear to auscultation bilaterally, normal work of breathing GI: soft, nontender, nondistended, + BS Ext: Right foot in boot and wrapped otherwise left without cyanosis, clubbing, or edema, Good distal pulses  Neuro:  Alert and Oriented x 3 Psych: euthymic mood, full affect  Wt Readings from Last 3 Encounters:  05/14/20 129 lb (58.5 kg)  05/07/20 127 lb 6.4 oz (57.8 kg)  04/26/20 127 lb 6.4 oz (57.8 kg)      Studies/Labs Reviewed:   EKG:  EKG is ordered today.  The ekg ordered today demonstrates atrial fibrillation with right bundle branch block  unchanged  Recent Labs: 09/23/2019: Magnesium 1.9 05/03/2020: ALT 19; BUN 30; Creatinine, Ser 1.12; Hemoglobin 10.0; Platelets 215; Potassium 4.5; Sodium 138   Lipid Panel    Component Value Date/Time   CHOL 147 01/31/2018 1217   TRIG 109 01/31/2018 1217   HDL 50 01/31/2018 1217   CHOLHDL 2.9 01/31/2018 1217   VLDL 22 01/31/2018 1217   [place] 75 01/31/2018 1217    Additional studies/ records that were reviewed today include:  01/2018 echo Study Conclusions   - Left ventricle: The cavity size was normal. Wall thickness was   increased in a pattern of mild LVH. Systolic function was normal.   The estimated ejection fraction was in the range of 55% to 60%.   Wall motion was normal; there were no regional wall motion   abnormalities. The study was not technically sufficient to allow   evaluation of LV diastolic dysfunction due to atrial   fibrillation. - Aortic valve: Moderately calcified annulus. Trileaflet; mildly   thickened, mildly calcified leaflets. Morphologically, there was   at least mild calcific aortic stenosis if not mild to moderate   stenosis. - Mitral valve: There was mild regurgitation. - Inferior vena cava: The vessel was dilated. The respirophasic   diameter changes were blunted (< 50%), consistent with elevated   central venous pressure. Estimated CVP 15 mmHg.  Risk Assessment/Calculations:    CHA2DS2-VASc Score = 5  This indicates a 7.2% annual risk of stroke. The patient's score is based upon: CHF History: Yes HTN History: Yes Diabetes History: Yes Stroke History: No Vascular Disease History: No Age Score: 2 Gender Score: 0        ASSESSMENT:    1. Paroxysmal atrial fibrillation (HCC)   2. Essential hypertension   3. Nonrheumatic aortic valve stenosis   4. Prostate cancer ([place])      PLAN:  In order of problems listed above:  Paroxysmal atrial fibrillation on Eliquis and diltiazem. Blood pressure running low so will decrease  diltiazem to 60 mg twice daily. They will monitor heart rate and blood pressure and call us if elevated. Recent labs 05/03/2020 creatinine 1.12 hemoglobin stable at 10. No bleeding problems. Also on aspirin for PAD.  Hypertension last visit diltiazem increased to 90 mg twice daily but recommend careful titration given age and prior orthostatic symptoms. Patient has lost 11 pounds after being treated for pneumonia. He is only taking and soft foods and its trouble trying to keep him hydrated. Will decrease diltiazem back to 60 mg twice daily. May need to further decrease lisinopril if blood pressure stays too low.  Mild aortic stenosis on echo 01/2018 asymptomatic. Will not repeat echo at this time.  Prostate cancer with bone mets followed by oncology  Shared Decision Making/Informed Consent        Medication Adjustments/Labs and Tests Ordered: Current medicines are reviewed at length with the patient today.  Concerns regarding medicines are outlined above.  Medication changes, Labs and Tests ordered today are listed in the Patient Instructions below. Patient Instructions  Medication Instructions:  Your physician has recommended you make the following change in your medication:   Diltiazem 60 mg Two Times Daily   *If you need a refill on your cardiac medications before your next appointment, please call your pharmacy*   Lab Work: NONE   If you have labs (blood work) drawn today and your tests are completely normal, you will receive your results only by: Marland Kitchen MyChart Message (if you have MyChart) OR . A paper copy in the mail If you have any lab test that is abnormal or we need to change your treatment, we will call you to review the results.   Testing/Procedures: NONE    Follow-Up: At [facility], you and your health needs are our priority.  As part of our continuing mission to provide you with exceptional heart care, we have created designated Provider Care Teams.  These Care Teams  include your primary Cardiologist (physician) and Advanced Practice Providers (APPs -  Physician Assistants and Nurse Practitioners) who all work together to provide you with the care you need, when you need it.  We recommend signing up for the patient portal called "MyChart".  Sign up information is provided on this After Visit Summary.  MyChart is used to connect with patients for Virtual Visits (Telemedicine).  Patients are able to view lab/test results, encounter notes, upcoming appointments, etc.  Non-urgent messages can be sent to your provider as well.   To learn more about what you can do with MyChart, go to [email or web address].    Your next appointment:   6 month(s)  The format for your next appointment:   In Person  Provider:   Carlyle Dolly, MD   Other Instructions Thank you for choosing [facility]!       Sumner Boast, PA-C  05/14/2020 1:26  PM    [facility] [address], [place], [place]  [address] Phone: [phone]; Fax: [phone]

## 2020-05-13 ENCOUNTER — Encounter (HOSPITAL_COMMUNITY): Payer: Self-pay

## 2020-05-13 ENCOUNTER — Encounter: Payer: Self-pay | Admitting: Nurse Practitioner

## 2020-05-14 ENCOUNTER — Encounter: Payer: Self-pay | Admitting: Physician Assistant

## 2020-05-14 ENCOUNTER — Ambulatory Visit (INDEPENDENT_AMBULATORY_CARE_PROVIDER_SITE_OTHER): Payer: Medicare Other | Admitting: Physician Assistant

## 2020-05-14 ENCOUNTER — Other Ambulatory Visit: Payer: Self-pay

## 2020-05-14 VITALS — BP 102/60 | HR 70 | Ht 70.0 in | Wt 129.0 lb

## 2020-05-14 DIAGNOSIS — I350 Nonrheumatic aortic (valve) stenosis: Secondary | ICD-10-CM

## 2020-05-14 DIAGNOSIS — I10 Essential (primary) hypertension: Secondary | ICD-10-CM

## 2020-05-14 DIAGNOSIS — C61 Malignant neoplasm of prostate: Secondary | ICD-10-CM

## 2020-05-14 DIAGNOSIS — I480 Paroxysmal atrial fibrillation: Secondary | ICD-10-CM

## 2020-05-14 MED ORDER — DILTIAZEM HCL ER 60 MG PO CP12: 60.0000 mg | ORAL_CAPSULE | Freq: Two times a day (BID) | ORAL | 11 refills | Status: DC

## 2020-05-14 NOTE — Patient Instructions (Signed)
Medication Instructions:  Your physician has recommended you make the following change in your medication:   Diltiazem 60 mg Two Times Daily   *If you need a refill on your cardiac medications before your next appointment, please call your pharmacy*   Lab Work: NONE   If you have labs (blood work) drawn today and your tests are completely normal, you will receive your results only by: Marland Kitchen MyChart Message (if you have MyChart) OR . A paper copy in the mail If you have any lab test that is abnormal or we need to change your treatment, we will call you to review the results.   Testing/Procedures: NONE    Follow-Up: At [facility], you and your health needs are our priority.  As part of our continuing mission to provide you with exceptional heart care, we have created designated Provider Care Teams.  These Care Teams include your primary Cardiologist (physician) and Advanced Practice Providers (APPs -  Physician Assistants and Nurse Practitioners) who all work together to provide you with the care you need, when you need it.  We recommend signing up for the patient portal called "MyChart".  Sign up information is provided on this After Visit Summary.  MyChart is used to connect with patients for Virtual Visits (Telemedicine).  Patients are able to view lab/test results, encounter notes, upcoming appointments, etc.  Non-urgent messages can be sent to your provider as well.   To learn more about what you can do with MyChart, go to [email or web address].    Your next appointment:   6 month(s)  The format for your next appointment:   In Person  Provider:   Carlyle Dolly, MD   Other Instructions Thank you for choosing [facility]!

## 2020-05-21 ENCOUNTER — Telehealth: Payer: Self-pay | Admitting: *Deleted

## 2020-05-21 NOTE — Telephone Encounter (Signed)
[name], daughter, called requesting patient's AVS from 04/16/2020 visit with Dinah to be faxed to [facility]. Stated that she wants them to update their orders for wound care.   AVS faxed to [facility] Fax: [phone]        Prior Versions: 1. Ngetich, Nelda Bucks, NP (Nurse Practitioner) at 04/16/2020 11:02 AM - Signed   - Home health Nurse to cleanse sacral/Gluteal area open area with saline,pat dry,apply foam dressing for protection.change dressing every 3 days and as needed if soiled. Notify for any signs of infection.  - Please get chest X-ray done at [facility] [place] then will call you with results.

## 2020-05-28 ENCOUNTER — Telehealth (INDEPENDENT_AMBULATORY_CARE_PROVIDER_SITE_OTHER): Payer: Medicare Other | Admitting: Nurse Practitioner

## 2020-05-28 ENCOUNTER — Ambulatory Visit: Payer: Medicare Other | Admitting: Urology

## 2020-05-28 ENCOUNTER — Other Ambulatory Visit: Payer: Self-pay

## 2020-05-28 DIAGNOSIS — R059 Cough, unspecified: Secondary | ICD-10-CM | POA: Diagnosis not present

## 2020-05-28 DIAGNOSIS — C61 Malignant neoplasm of prostate: Secondary | ICD-10-CM

## 2020-05-28 DIAGNOSIS — I96 Gangrene, not elsewhere classified: Secondary | ICD-10-CM

## 2020-05-28 DIAGNOSIS — L89890 Pressure ulcer of other site, unstageable: Secondary | ICD-10-CM | POA: Diagnosis not present

## 2020-05-28 DIAGNOSIS — E440 Moderate protein-calorie malnutrition: Secondary | ICD-10-CM | POA: Diagnosis not present

## 2020-05-28 DIAGNOSIS — L89152 Pressure ulcer of sacral region, stage 2: Secondary | ICD-10-CM | POA: Diagnosis not present

## 2020-05-28 DIAGNOSIS — E1169 Type 2 diabetes mellitus with other specified complication: Secondary | ICD-10-CM | POA: Diagnosis not present

## 2020-05-28 DIAGNOSIS — I480 Paroxysmal atrial fibrillation: Secondary | ICD-10-CM | POA: Diagnosis not present

## 2020-05-28 NOTE — Progress Notes (Signed)
This service is provided via telemedicine  No vital signs collected/recorded due to the encounter was a telemedicine visit.   Location of patient (ex: home, work):Home  Patient consents to a telephone visit: Yes, see encounter dated 11/27/2019  Location of the provider (ex: office, home):  [place]  Name of any referring provider:  N/A  Names of all persons participating in the telemedicine service and their role in the encounter: Sherrie Mustache, Nurse Practitioner, Carroll Kinds, CMA, and patien's daughter [name].   Time spent on call:  12 minutes with medical assistant.

## 2020-05-28 NOTE — Progress Notes (Signed)
Careteam: Patient Care Team: [name], NP as PCP - General (Geriatric Medicine) [name], MD as PCP - Cardiology (Cardiology) [name], MD as Consulting Physician (Urology) [name], MD (Inactive) as Consulting Physician (Hematology and Oncology) [name], MD as Consulting Physician (Orthopedic Surgery) [name], DPM as Consulting Physician (Podiatry) [name], MD as Consulting Physician (Hematology) [name], MD (Dermatology) [name], MD as Consulting Physician (Gastroenterology) [name], MD as Consulting Physician (Vascular Surgery) Sater, [name], MD (Neurology) Clinic, [name], [name] (Ophthalmology)  Advanced Directive information    Allergies  Allergen Reactions  . Tape Other (See Comments)    SKIN IS VERY THIN AND WILL TEAR AND BRUISE VERY EASILY!!!!  . Augmentin [Amoxicillin-Pot Clavulanate] Rash and Other (See Comments)    Redness of skin    Chief Complaint  Patient presents with  . Acute Visit    Patient's daughter states that patient has breakdown of skin in buttocks area. Very soft and wet. Little blood at times. No odor, no yellow drainage. Have tried using butt paste, but stopped using it because of it drying and puling off skin. Started using desitin and vaseline. Request air mattress topper     HPI: Patient is a 87 y.o. male to follow up on breakdown on sacrum.  He is followed by home health and wound care team Has been applying butt paste. This would dry and pull off skin.  Sitting in bed throughout the day.  Is not damp to sacral area with urine.  Will get sweaty and then depends get stuck.   Used to sleep on right side but now scoots until he gets on his backside. And used to get up at night but now sleeps in bed all night.  Does not get up during the day as much as he should. Takes long naps.  Has an air cushion to back and  bottom in chair.  [facility] wants him to get a purple foam cushion   Continues with cough and mucous- using mucinex prior to breakfast which has been helpful.  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  Heal is doing well and may heel. Eschar came off and has been improving.   Blood sugars have been stable. 120 and lower. No hypoglycemia episodes. - unable to get blood work through home health.   Prostate cancer- following with oncologist and cancer center. Trying to wean off prednisone to see if this promotes wound healing.  stoppped lupron and zytiga   Review of Systems:  Review of Systems  Constitutional: Negative for chills and fever.  Respiratory: Negative for shortness of breath.   Cardiovascular: Negative for leg swelling.  Gastrointestinal: Negative for constipation and diarrhea.  Skin: Negative for itching and rash.       Multiple wounds.  Neurological: Positive for weakness.  Psychiatric/Behavioral: Negative for depression and memory loss. The patient does not have insomnia.     Past Medical History:  Diagnosis Date  . Acute bronchitis   . Acute lower UTI 11/25/2017  . Amputation of toe of left foot ([place]) 07/30/2017, 08/27/2017    Great toe 07/30/2017 Second toe 08/27/2017   . Arthritis   . Aspiration pneumonia ([place]) 01/31/2018  . Atrial fibrillation ([place])    Dr. Harl Bowie- [facility] follows saw 11'14  . Bladder stones    tx. with oral meds and antibiotics, now surgery planned  . Bone  metastases ([place]) 08/21/2015  . Cataracts, both eyes    surgery planned May 2015  . Community acquired pneumonia 04/16/2018  . Cystoid macular edema 04/21/2015    Right eye 04/21/2015   . Dehydration 04/16/2018  . Dementia without behavioral disturbance ([place]) 04/16/2018  . Diabetes mellitus without complication ([place])    Type II  . Diarrhea 11/25/2017  . Dyspnea    with activity  . Dysrhythmia    Afib  . Family history of breast cancer    . Family history of colon cancer   . Genetic testing 02/09/2017  . GERD (gastroesophageal reflux disease)   . 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 Margot Chimes, thus far without recurrence.   Marland Kitchen History of MRI of lumbar spine 03/05/2014  . Hypertension   . Hypertrophy of prostate with urinary obstruction   . Hypomagnesemia 03/16/2017  . Metabolic encephalopathy 09/98/3382  . Metastatic adenocarcinoma to prostate ([place]) 09/08/2013  . 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 NHL (non-Hodgkin's lymphoma) ([place]) 2005   Diffuse large B-cell lymphoma, clinically stage IIIA, CD20 positive, status post cervical lymph node biopsy 08/03/2003 on the left. PET scan was also positive in the spleen and small bowel region, but bone marrow aspiration and biopsy were negative. So he essentially had stage IIIAs. He received R-CHOP x6 cycles with CR established by PET scan criteria on 11/23/2003 with no evidence for relapse th  . Numbness 02/19/2017   Per [facility] new patient packet  . Osteomyelitis ([place]) 06/29/2017   toe of left foot   . PAD (peripheral artery disease) ([place]) 08/27/2017  . Polyneuropathy 02/19/2017  . Postural dizziness with presyncope 02/19/2017  . S/P TURP 08/10/2013  . Sepsis ([place]) 07/02/2018  . Skin cancer    Basal cell- face,head, neck,  arms, legs, Back  . Urinary frequency 02/19/2017   Past Surgical History:  Procedure Laterality Date  . ABDOMINAL AORTOGRAM W/LOWER EXTREMITY N/A 09/20/2019   Procedure: ABDOMINAL AORTOGRAM W/LOWER EXTREMITY;  Surgeon: Marty Heck, MD;  Location: [facility];  Service: Cardiovascular;  Laterality: N/A;  . AMPUTATION Left 06/29/2017   Procedure: AMPUTATION LEFT GREAT TOE;  Surgeon: [name],  MD;  Location: [facility];  Service: Vascular;  Laterality: Left;  . AMPUTATION Left 08/27/2017   Procedure: AMPUTATION Left SECOND TOE;  Surgeon: [name], MD;  Location: [facility];  Service: Vascular;  Laterality: Left;  . CATARACT EXTRACTION Bilateral 2015  . CHOLECYSTECTOMY    . COLON RESECTION     small bowel  . CYSTOSCOPY WITH LITHOLAPAXY N/A 08/10/2013   Procedure: CYSTOSCOPY WITH LITHOLAPAXY WITH [name];  Surgeon: [name], MD;  Location: [facility];  Service: Urology;  Laterality: N/A;  . ESOPHAGEAL DILATION N/A 02/27/2016   Procedure: ESOPHAGEAL DILATION;  Surgeon: [name], MD;  [facility];  Service: Endoscopy;  Laterality: N/A;  . ESOPHAGEAL DILATION N/A 07/07/2018   Procedure: ESOPHAGEAL DILATION;  Surgeon: [name], MD;  [facility];  Service: Endoscopy;  Laterality: N/A;  . ESOPHAGOGASTRODUODENOSCOPY N/A 02/27/2016   Procedure: ESOPHAGOGASTRODUODENOSCOPY (EGD);  Surgeon: [name], MD;  [facility];  Service: Endoscopy;  Laterality: N/A;  12:00  . ESOPHAGOGASTRODUODENOSCOPY N/A 07/07/2018   Procedure: ESOPHAGOGASTRODUODENOSCOPY (EGD);  Surgeon: Hildred Laser  U, MD;  [facility];  Service: Endoscopy;  Laterality: N/A;  . INCISION AND DRAINAGE PERIRECTAL ABSCESS    . LOWER EXTREMITY ANGIOGRAPHY N/A 06/25/2017   Procedure: LOWER EXTREMITY ANGIOGRAPHY;  Surgeon: [name], MD;  Location: [facility];  Service: Cardiovascular;  Laterality: N/A;  . LYMPH NODE DISSECTION Left    '05-neck  . PERIPHERAL VASCULAR ATHERECTOMY Right 09/20/2019   Procedure: PERIPHERAL VASCULAR ATHERECTOMY;  Surgeon: Marty Heck, MD;  Location: [facility];  Service: Cardiovascular;  Laterality: Right;  Posterior tibial  . PORT-A-CATH REMOVAL    . PORTACATH PLACEMENT     insertion and removal -last chemotherapy 10 yrs ago  . TRANSURETHRAL RESECTION OF PROSTATE N/A 08/10/2013   Procedure: TRANSURETHRAL  RESECTION OF THE PROSTATE WITH GYRUS INSTRUMENTS;  Surgeon: [name], MD;  Location[facility];  Service: Urology;  Laterality: N/A;   Social History:   reports that he has never smoked. He has never used smokeless tobacco. He reports that he does not drink alcohol and does not use drugs.  Family History  Problem Relation Age of Onset  . Heart attack Mother        died in her 38s  . Other Father 56       pneumonia - died  . Breast cancer Sister        dx in her 10s-60s  . Colon cancer Brother        dx in his 34s  . Cancer Sister        TAH/BSO due to "male cancer"  . Breast cancer Daughter 31  . Breast cancer Daughter 81       DCIS - ATM VUS on [employer] 83 gene panel in 2018  . Breast cancer Daughter 88       DCIS - Negative on [employer] MyRisk 25 gene apnel in 2015  . Thyroid cancer Daughter 88       Medullary and Papillary  . Thyroid nodules Grandchild     Medications: Patient's Medications  New Prescriptions   No medications on file  Previous Medications   ACETAMINOPHEN (TYLENOL) 500 MG TABLET    Take 500 mg by mouth at bedtime.   ALBUTEROL (2.5 MG/3ML) 0.083% NEBU 3 ML, ALBUTEROL (5 MG/ML) 0.5% NEBU 0.5 ML    Inhale into the lungs 3 (three) times daily as needed.   APIXABAN (ELIQUIS) 5 MG TABS TABLET    Take 1 tablet (5 mg total) by mouth 2 (two) times daily.   ASPIRIN 81 MG CAPS    Take 81 mg by mouth daily.    BLOOD GLUCOSE METER KIT AND SUPPLIES    Dispense based on patient and insurance preference. Use four times daily as directed. (FOR ICD-10 E10.9, E11.9).   CEPHALEXIN (KEFLEX) 250 MG CAPSULE    Take 250 mg by mouth at bedtime. Preventative for UTI prescribed by urology   CHOLECALCIFEROL (VITAMIN D3) 1000 UNITS CAPS    Take 1,000 Units by mouth daily.    CRANBERRY 500 MG TABS    Take 500 mg by mouth daily.    DENOSUMAB (XGEVA) 120 MG/1.7ML SOLN INJECTION    Inject 120 mg into the skin every 30 (thirty) days.   DEXTROMETHORPHAN-GUAIFENESIN (MUCINEX DM PO)     Take by mouth 2 (two) times daily.   DICLOFENAC SODIUM (VOLTAREN) 1 % GEL    Apply 2 g topically 4 (four) times daily.   DILTIAZEM (CARDIZEM SR) 60 MG 12 HR CAPSULE  Take 1 capsule (60 mg total) by mouth 2 (two) times daily.   DONEPEZIL (ARICEPT) 10 MG TABLET    Take 1 tablet (10 mg total) by mouth daily.   FUROSEMIDE (LASIX) 20 MG TABLET    Take 0.5 tablets (10 mg total) by mouth daily. Take for 5 day, then change to as needed for shortness of breath for pleural effusion.   GLIPIZIDE (GLUCOTROL XL) 5 MG 24 HR TABLET    Take 1 tablet (5 mg total) by mouth daily with breakfast.   GLUCOSE BLOOD (EASYMAX TEST) TEST STRIP    Use as instructed   LANCETS 28G MISC    1 kit by Does not apply route every morning.   LEUPROLIDE ACETATE, 6 MONTH, (LUPRON DEPOT, 65-MONTH,) 45 MG INJECTION    Inject 45 mg into the muscle every 6 (six) months.   LISINOPRIL (ZESTRIL) 20 MG TABLET    Take 1 tablet (20 mg total) by mouth in the morning and at bedtime.   METFORMIN (GLUCOPHAGE) 850 MG TABLET    Take 850 mg by mouth 2 (two) times daily with a meal.    MIRTAZAPINE (REMERON) 15 MG TABLET    Take 1 tablet (15 mg total) by mouth at bedtime.   MULTIPLE VITAMIN (MULTIVITAMIN WITH MINERALS) TABS TABLET    Take 1 tablet by mouth daily.    PREDNISONE (DELTASONE) 5 MG TABLET    Take 2.5 mg by mouth daily. To stop after 4 weeks   PROBIOTIC PRODUCT (PROBIOTIC DAILY PO)    Take 1 tablet by mouth daily. Every morning    SIMETHICONE (MYLICON) 80 MG CHEWABLE TABLET    Chew 80 mg by mouth daily.  Modified Medications   No medications on file  Discontinued Medications   No medications on file    Physical Exam:  There were no vitals filed for this visit. There is no height or weight on file to calculate BMI. Wt Readings from Last 3 Encounters:  05/14/20 129 lb (58.5 kg)  05/07/20 127 lb 6.4 oz (57.8 kg)  04/26/20 127 lb 6.4 oz (57.8 kg)    Physical Exam Constitutional:      Comments: In bed during visit, resting  comfortable, daughter provides most information   Neurological:     Mental Status: He is alert.  Psychiatric:        Behavior: Behavior normal.     Labs reviewed: Basic Metabolic Panel: Recent Labs    09/22/19 0327 09/23/19 0313 11/01/19 1239 02/22/20 0956 03/25/20 1533 05/03/20 1018  NA 140  --    < > 135 136 138  K 4.9  --    < > 4.7 4.4 4.5  CL 111  --    < > 103 102 102  CO2 21*  --    < > _0 GLUCOSE 182*  --    < > 96 167* 124*  BUN 17  --    < > 23 19 30*  CREATININE 1.21  --    < > 1.18 1.01 1.12  CALCIUM 9.1  --    < > 10.4* 9.2 10.4*  MG 1.0* 1.9  --   --   --   --    < > = values in this interval not displayed.   Liver Function Tests: Recent Labs    02/22/20 0956 03/25/20 1533 05/03/20 1018  AST 14* 12* 17  ALT _1 ALKPHOS 69 75 59  BILITOT 0.7 0.3 0.7  PROT 6.3* 6.2* 6.7  ALBUMIN 3.2* 2.9* 3.3*   No results for input(s): LIPASE, AMYLASE in the last 8760 hours. No results for input(s): AMMONIA in the last 8760 hours. CBC: Recent Labs    01/26/20 1059 02/22/20 0956 05/03/20 1018  WBC 8.5 8.3 7.2  NEUTROABS 4.8 3.7 3.8  HGB 11.1* 9.5* 10.0*  HCT 33.9* 29.2* 31.9*  MCV 90.4 93.6 93.0  PLT 179 268 215   Lipid Panel: No results for input(s): CHOL, HDL, LDLCALC, TRIG, CHOLHDL, LDLDIRECT in the last 8760 hours. TSH: No results for input(s): TSH in the last 8760 hours. A1C: Lab Results  Component Value Date   HGBA1C 5.1 09/06/2019     Assessment/Plan 1. Pressure ulcer of sacral region, stage 2 ([place]) -to buttock, being closely monitors by wound care center as well.  DME ordered for mattress overlay for pressure reduction. Family attempts to turn patient and prop him but he ends up on back.  -using destin at this time -continue to turn frequently -increase protein intake -if able to get foam dressing this would be benefit. However they are expensive and having a hard time getting one large enough to cover area needed. - For home  use only DME Other see comment  2. Cough -ongoing, mucinex has been helping to decrease secretions. May continue PRN.   3. Type 2 diabetes mellitus with other specified complication, without long-term current use of insulin (H[facility]) Stable on current regimen, no hypoglycemia noted.  Needs updated A1c  4. Paroxysmal atrial fibrillation ([facility]) Continues on eliquis, cbc has been stable.   5. Pressure injury of right foot, unstageable ([place]) Continues with pressure reduction. Heel is slowly healing.   6. Moderate protein-calorie malnutrition ([place]) Noted on labs, discussed increasing protein and supplements to promote wound healing.  7. Prostate cancer ([place]) With mets to the bone, followed by oncology. He has stopped zytiga and weaning prednisone at the time.   8. Dry gangrene ([place]) Stable at this time, followed closely by wound care.   Next appt: 3 months for routine follow up.  Carlos American. Harle Battiest  [facility] [phone]    Virtual Visit via Deloris Ping  I connected with patient on 05/28/20 at 11:30 AM EST by video and verified that I am speaking with the correct person using two identifiers.  Location: Patient: HOME Provider: [facility]   I discussed the limitations, risks, security and privacy concerns of performing an evaluation and management service by telephone and the availability of in person appointments. I also discussed with the patient that there may be a patient responsible charge related to this service. The patient expressed understanding and agreed to proceed.   I discussed the assessment and treatment plan with the patient. The patient was provided an opportunity to ask questions and all were answered. The patient agreed with the plan and demonstrated an understanding of the instructions.   The patient was advised to call back or seek an in-person evaluation if the symptoms worsen or if the condition fails to improve as  anticipated.  I provided 25 minutes of non-face-to-face time during this encounter.  Carlos American. Harle Battiest Avs printed and mailed

## 2020-05-29 ENCOUNTER — Other Ambulatory Visit (HOSPITAL_COMMUNITY): Payer: Self-pay

## 2020-05-29 NOTE — Progress Notes (Signed)
ac

## 2020-05-30 ENCOUNTER — Encounter (HOSPITAL_BASED_OUTPATIENT_CLINIC_OR_DEPARTMENT_OTHER): Payer: Medicare Other | Attending: Internal Medicine | Admitting: Internal Medicine

## 2020-05-30 ENCOUNTER — Other Ambulatory Visit: Payer: Self-pay

## 2020-05-30 ENCOUNTER — Other Ambulatory Visit (HOSPITAL_COMMUNITY): Payer: Self-pay

## 2020-05-30 DIAGNOSIS — L97412 Non-pressure chronic ulcer of right heel and midfoot with fat layer exposed: Secondary | ICD-10-CM | POA: Insufficient documentation

## 2020-05-30 DIAGNOSIS — Z7901 Long term (current) use of anticoagulants: Secondary | ICD-10-CM | POA: Insufficient documentation

## 2020-05-30 DIAGNOSIS — L97514 Non-pressure chronic ulcer of other part of right foot with necrosis of bone: Secondary | ICD-10-CM | POA: Diagnosis not present

## 2020-05-30 DIAGNOSIS — L97521 Non-pressure chronic ulcer of other part of left foot limited to breakdown of skin: Secondary | ICD-10-CM | POA: Insufficient documentation

## 2020-05-30 DIAGNOSIS — E11621 Type 2 diabetes mellitus with foot ulcer: Secondary | ICD-10-CM | POA: Diagnosis not present

## 2020-05-30 DIAGNOSIS — E1140 Type 2 diabetes mellitus with diabetic neuropathy, unspecified: Secondary | ICD-10-CM | POA: Diagnosis not present

## 2020-05-30 DIAGNOSIS — L97519 Non-pressure chronic ulcer of other part of right foot with unspecified severity: Secondary | ICD-10-CM | POA: Diagnosis present

## 2020-05-30 DIAGNOSIS — E1169 Type 2 diabetes mellitus with other specified complication: Secondary | ICD-10-CM

## 2020-05-30 DIAGNOSIS — L89892 Pressure ulcer of other site, stage 2: Secondary | ICD-10-CM | POA: Diagnosis not present

## 2020-05-30 DIAGNOSIS — L89610 Pressure ulcer of right heel, unstageable: Secondary | ICD-10-CM | POA: Diagnosis not present

## 2020-05-30 NOTE — Progress Notes (Signed)
DARCY, CORDNER ([id number]) Visit Report for 05/30/2020 Debridement Details Patient Name: Date of Service: [name] 05/30/2020 1:00 PM Medical Record Number: [id number] Patient Account Number: [id number] Date of Birth/Sex: Treating RN: Jun 10, 1923 (87 y.o. [name] Primary Care Provider: Sherrie Mustache Other Clinician: Referring Provider: Treating Provider/Extender: Deloria Lair in Treatment: 44 Debridement Performed for Assessment: Wound #3 Right Calcaneus Performed By: Physician Ricard Dillon., MD Debridement Type: Debridement Severity of Tissue Pre Debridement: Fat layer exposed Level of Consciousness (Pre-procedure): Awake and Alert Pre-procedure Verification/Time Out Yes - 13:55 Taken: Start Time: 13:56 Pain Control: Lidocaine 4% T opical Solution T Area Debrided (L x W): otal 2 (cm) x 2.5 (cm) = 5 (cm) Tissue and other material debrided: Viable, Non-Viable, Slough, Subcutaneous, Skin: Dermis , Skin: Epidermis, Fibrin/Exudate, Slough Level: Skin/Subcutaneous Tissue Debridement Description: Excisional Instrument: Curette Bleeding: Minimum Hemostasis Achieved: Pressure End Time: 14:00 Procedural Pain: 0 Post Procedural Pain: 0 Response to Treatment: Procedure was tolerated well Level of Consciousness (Post- Awake and Alert procedure): Post Debridement Measurements of Total Wound Length: (cm) 2 Stage: Unstageable/Unclassified Width: (cm) 2.5 Depth: (cm) 0.1 Volume: (cm) 0.393 Character of Wound/Ulcer Post Debridement: Improved Severity of Tissue Post Debridement: Fat layer exposed Post Procedure Diagnosis Same as Pre-procedure Electronic Signature(s) Signed: 05/30/2020 5:13:40 PM By: Linton Ham MD Signed: 05/30/2020 6:08:01 PM By: Deon Pilling Entered By: Linton Ham on 05/30/2020 14:06:05 -------------------------------------------------------------------------------- Debridement Details Patient Name: Date  of Service: MA [name] Fuchs E. 05/30/2020 1:00 PM Medical Record Number: [id number] Patient Account Number: [id number] Date of Birth/Sex: Treating RN: Dec 15, 1923 (87 y.o. Lorette Ang, Meta.Reding Primary Care Provider: Sherrie Mustache Other Clinician: Referring Provider: Treating Provider/Extender: Deloria Lair in Treatment: 44 Debridement Performed for Assessment: Wound #6 Left T Fifth oe Performed By: Physician Ricard Dillon., MD Debridement Type: Debridement Severity of Tissue Pre Debridement: Limited to breakdown of skin Level of Consciousness (Pre-procedure): Awake and Alert Pre-procedure Verification/Time Out Yes - 13:55 Taken: Start Time: 13:56 Pain Control: Lidocaine 4% T opical Solution T Area Debrided (L x W): otal 0.2 (cm) x 0.2 (cm) = 0.04 (cm) Tissue and other material debrided: Viable, Non-Viable, Slough, Subcutaneous, Skin: Dermis , Skin: Epidermis, Fibrin/Exudate, Slough Level: Skin/Subcutaneous Tissue Debridement Description: Excisional Instrument: Curette Bleeding: Minimum Hemostasis Achieved: Pressure End Time: 14:00 Procedural Pain: 0 Post Procedural Pain: 0 Response to Treatment: Procedure was tolerated well Level of Consciousness (Post- Awake and Alert procedure): Post Debridement Measurements of Total Wound Length: (cm) 0.2 Stage: Category/Stage II Width: (cm) 0.2 Depth: (cm) 0.1 Volume: (cm) 0.003 Character of Wound/Ulcer Post Debridement: Improved Severity of Tissue Post Debridement: Limited to breakdown of skin Post Procedure Diagnosis Same as Pre-procedure Electronic Signature(s) Signed: 05/30/2020 5:13:40 PM By: Linton Ham MD Signed: 05/30/2020 6:08:01 PM By: Deon Pilling Entered By: Linton Ham on 05/30/2020 14:06:13 -------------------------------------------------------------------------------- HPI Details Patient Name: Date of Service: MA [name] Fuchs E. 05/30/2020 1:00 PM Medical Record Number:  [id number] Patient Account Number: [id number] Date of Birth/Sex: Treating RN: [birth year] (87 y.o. [name] Primary Care Provider: Sherrie Mustache Other Clinician: Referring Provider: Treating Provider/Extender: Deloria Lair in Treatment: 35 History of Present Illness HPI Description: ADMISSION 05/02/2019 This is a 87 year old man accompanied by his daughter. He lives in [place] with his elderly wife. He has home caregivers as well. Apparently on February 28, 2019 they noted blood on his sock and noticed a wound on the tip of his right great toe.  He has been cared for by Dr. Posey Pronto who is his podiatrist in [place]. Apparently they have been using Betadine to this area. The patient has a thick mycotic nail on the right as well which is embedded in the wound in some areas. Fortunately the patient has diabetic neuropathy and is not really experiencing any pain otherwise I think this would all be quite painful. The patient was previously treated in 2019 for wounds on the left foot. This included a fairly thorough vascular review by vein and vascular Dr. Oneida Alar. His ABIs at that time were noncompressible bilaterally TBI on the right was 0.88 with biphasic waveforms on the right and monophasic waveforms at the left. He ended up having amputations of the left first and second toes. He did have an angiogram on 06/25/2017 which showed on the right the right common femoral profundofemoral for Morris and superficial femoral arteries were widely patent the right popliteal was widely patent. Tibial vessels were not well opacified secondary to motion artifact however he was felt to have all 3 tibial vessels intact with some calcification and some areas of stenosis within the posterior tibial artery but relatively good flow to the right lower leg. He also had the left reviewed as well which was the source of the problem at that time. Past medical history;  prostate cancer on oral antiandrogen therapy with bone mets, GI stromal tumor in 2005, type 2 diabetes with peripheral neuropathy, hypertension, atrial fibrillation on Eliquis, history of non-Hodgkin's lymphoma treated with CHOP in 2005. He had amputations of the left first and second toes secondary to osteomyelitis. Does not appear that he has had an x-ray of the right foot ABI in our clinic on the right was noncompressible [see full arterial discussion above]. 1/19; area on the tip of the right great toe under a very mycotic nail tip. We use silver alginate. 1/26; area of the tip of the right great toe under a very mycotic nail. A lot of this seems to have closed down we have been using silver alginate 2/9; the areas on the tip of the right great toe under a very mycotic nail. A lot of this continues to close down. I do not think there is a subungual problem. I am hopeful that this mycotic nail which is thick and raised will allow full epithelialization of the wound 2/23; this was a difficult area on the tip of his right great toe. Thick mycotic nail over the base of the wound. This appears to have closed down now. The nail itself is thick split with considerable subungual debris. READMISSION 07/25/2019 This is a 87 year old man that we cared for earlier this year in the clinic without a wound on the tip of his right great toe under a very mycotic toenail. He is a type II diabetic. His ABIs have been noncompressible however he has been felt to have adequate blood flow in the right to heal the wound. We were able to get this wound to close over as I understand things he followed with Dr. Caprice Beaver had some debridement of the nail everything looks fine and apparently the wound bed was washed off by his wife over the weekend and by Monday it was open. His daughter is able to show me pictures. Unfortunately this now has exposed bone raising the possibility that he has had underlying osteomyelitis all  along. He has been using Silvadene cream on this he has been referred back to the clinic by Dr. Caprice Beaver. He had x-rays  of the right foot that did not show evidence of osteomyelitis in the right foot. He has not been on antibiotics He also had an x-ray of the left foot that showed findings suspicious for osteomyelitis involving the residual base of the first proximal phalanx and the proximal phalanx of the left second toe as well as the left second metatarsal head however he has no wounds in this area. 4/12; culture from last time of bone in the right great toe was negative. The patient's wife apparently scrubbed this toe vigorously and according to his daughter reopen the wound however unfortunately it is all exposed bone last time. I removed some of this and we use Prisma unfortunately the area does not look much better today. Previous x-rays done in podiatry office Dr. Caprice Beaver did not show evidence of osteomyelitis. Nevertheless I think osteomyelitis here is probably quite likely. Still using Prisma 4/26; he took 2 weeks of doxycycline although his daughter is telling me he is not eating. He has loose bowel movements however I did not get the sense that this is changed all that much. He still has exposed bone. Culture I did last time showed a few coag negative staph I do not think that this is necessarily relevant. I still am operating under the premise that the patient probably has osteomyelitis. After considerable difficulty we are able to determine he is not allergic to cephalosporins I therefore gave him Keflex. 5/10; is tolerating Keflex 500 3 times daily quite well. He still has the small wound in the tip of his toe with exposed bone.Marland Kitchen Unfortunately he does not really have any granulation tissue. He saw Dr. Caprice Beaver of podiatry in [place] last week I have not had a chance to look at his notes The patient had an extensive vascular work-up including an angiogram in 2019. He was felt to have  adequate blood supply on the right at the time although he did have amputations of the left first and second toe by Dr. Oneida Alar. 09/11/19 -Patient is back and is about to finish his Keflex, appointment with Dr. Oneida Alar is in the works. The right great toe wound looks about the same, the left third toe dorsal wound has healed. There was a small eschar covering it that was removed by podiatry.According to patient's daughter the right great toe base on the plantar surface had some redness that seems to have disappeared. 10/13/2019; patient is back in clinic today after 1 month hiatus although I have not seen him since 5/10. Since he was last here he was admitted to hospital from 6/2 through 6/5 because of a nonhealing wound on his right great toe. He underwent an angiogram which showed patent common femoral, profunda, SFA above and below-knee popliteal artery. The dominant runoff in the right lower extremity was through the anterior tibial but that became very small at the level of the ankle and had minimal outflow into the foot. The peroneal artery was patent but atretic. 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 Interventions were attempted on the posterior tibial. Mechanical orbital atherectomy down to the level of the ankle and then this was angioplastied with 2.3 mm and 3 mm balloons. Flow remained very sluggish down the posterior tibial artery but was patent. Post event there apparently was embolization of an area involving the plantar left heel and sides of the left heel. He arrives back  in clinic today it has been almost 7 weeks since I have seen him. Miraculously the great toe area with open bone is closed over probably as result of Dr. Ainsley Spinner interventions. He does however have an extensive ischemic area on the plantar and medial aspect of his heel. I reviewed Dr. Ainsley Spinner  last note from 3 days ago on 6/22. He did not think there was enough inflow to heal the heel wound. With eschar and sloughing. Wondered whether he could heal a below-knee amputation but more likely an above the knee amputation when required. He was placed on Xeroform to the right heel In discussing things with the patient's family and the patient present he is not in a lot of pain. The area does not appear to be grossly infected. The patient appears to be comfortable and conversational. He is eating and drinking fairly well. 7/9; the patient saw Dr. Carlis Abbott on 6/29. He had previously done heroic attempt to establish blood flow as described above. 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 Although the eschar on the heel looks ominous both plantar and medial it is certainly no worse than last week. We will continue Betadine to this area with alginate. Ultimately this will separate off and we will see about the viability of the tissue underneath. Also in clinic today our nurses were washing his right great toe tip apparently some of the skin came off eschared and there is an open wound at the tip of the great toe with exposed bone similar to what he had previously 7/23; he has the open area on the tip of his first toe which I think is mostly bone. Then he has the embolic area on the plantar heel extending into the medial heel. We have been using Betadine and silver alginate to the heel silver alginate to the toe. His daughter brings up that she was given nitroglycerin patches by Dr. Donzetta Matters to try and open blood flow to the foot. She has not been using them out of concern of systemic hypotension and also that the labels said not to put them on extremities 8/10-Returns at 3 weeks, open area on the tip of his first toe that is dried out, the heel with eschar, applying Betadine to the eschar area and  silver alginate dressing. 9/7; 1 month follow-up. He has an open area on the tip of the toe almost subungual with a thick mycotic nail. The heel wound has black eschar but this is beginning to separate. We have been using Betadine and silver alginate on the heel and simply silver alginate on the right great toe 9/23; the patient was worked in urgently at the request of his wife was concerned about infection in the tip of the toe. Apparently this was brought up by home health. 10/14; the patient has the area of exposed bone on the tip of the right great toe. He also has the eschared area on the plantar heel which was a embolic phenomenon at the time of attempted revascularization. He also had an area of denuded epithelium over both buttocks which I think is probably a combination of pressure friction and moisture. They have been using zinc oxide which is appropriate 10/26. The patient has an area of wider exposed bone at the tip of the right great toe. The substantial eschared area on the heel was already 50% separated I remove this. Actually the patient's wife had called  repetitively for urgent appointments with some degree of erythema in the foot as the primary concern. It turns out that she has been to see her primary doctor after the patient had a fall off the commode resulting in skin tears to both forearms. He was placed on doxycycline because of concern of cellulitis of the toe. She is also been to see [facility] in [place] with regards to the toe. Apparently he shaved the bone and recommended Betadine to this. Noteworthy that the patient already had an extensive arterial evaluation with an angiogram. He has known PAD with tibial vessel disease. The last effort was an attempt to open up the posterior tibial artery which resulted in symptoms of embolization to the heel. 11/9; since the patient was last here he was brought to the ER on 11/4. He was felt to have cellulitis in the  right great toe. They did not do a wound culture however they did blood cultures which showed 3 out of 3 Staph hominis. He has been treated with clindamycin 150 mg 3 times times a day. His daughter says he went to the hospital with altered LOC. He had been running a fever but was not febrile on arrival to the ER. Looking over the emergency room notes he had erythema in his foot and I think he had cellulitis. He had apparently been on doxycycline 100 mg twice daily prescribed by Dr. Caprice Beaver prior to this. The staph isolate itself is very resistant it is sensitive to vancomycin but resistant to methicillin's, quinolones, tetracycline and Septra. During the stay in the ER she was seen by vein and vascular they noted that he does not have any further options for revascularization. They do not think that he could undergo any foot conserving surgery therefore the only thing left here would be a BKA The area of the heel that I debrided last time he is here actually looks quite healthy nice healthy looking granulation tissue over perhaps 60 to 70% of the wound still 30% slough we have been using Iodoflex. They have been using silver collagen to the toe The other issue here is the daughter is struggling with the idea of in-home hospice. I went over this with her. I emphasized that hospice is not a home care agency as a velocity of care at the end of life. Patient is 87 years old he apparently is eating and drinking less and since I have known him he has become more frail 03/11/2020 upon evaluation today patient unfortunately is showing signs of progression in regard to his right great toe. Currently he is actually experiencing increased evidence of skin deterioration and in fact this toe is developing into more of a dry gangrene issue at this point. He was signed up for hospice on Thursday and then apparently hospice was called off on Friday due to the fact that they were talking about doing wet-to-dry  dressings to both wound locations and his daughter was really concerned about this. She did not want anything to make the wounds progress faster than they should be. With that being said I think a wet-to-dry dressing would be okay for the heel location. I do not believe this would cause any harm. With that being said and in regard to the toe I really do agree I would not really want a wet-to-dry dressing here I think more of like Betadine and dry gauze dressing could be a appropriate way to go after the toe was painted with Betadine that is.  12/9. It has been a month since I saw this man. He was using Betadine to the great toe and Iodoflex to the area on the heel. The heel actually look quite good last time I saw this this is gotten larger and not nearly as healthy looking as last time. Moreover the right first toe wound now has dry gangrene Since they were last here they have not elected to sign up for hospice. They do not have home health. They are trying to get some home care through the [facility] 1/13; the patient's right first toe is mummified to the inter phalangeal joint. The area on the plantar aspect of his right heel actually measures quite a bit better. We have been using Iodoflex His daughter points out an area on the plantar aspect of the left 5th toe at roughly the level of the proximal inner phalangeal 2/10; right first toe remains mummified there is no change in this no evidence of infection no erythema spreading proximally into the foot He still has the area on the plantar aspect of his right heel. This requires a debridement. We have been using silver alginate He had an area last time at the base of his left fifth toe. This is healed however he has a new area on the medial part of the left fifth toe. This also required debridement. We have been using silver alginate largely to all wound areas Electronic Signature(s) Signed: 05/30/2020 5:13:40 PM By: Linton Ham MD Entered By: Linton Ham on 05/30/2020 14:07:52 -------------------------------------------------------------------------------- Physical Exam Details Patient Name: Date of Service: [name] Schwartz E. 05/30/2020 1:00 PM Medical Record Number: [id number] Patient Account Number: [id number] Date of Birth/Sex: Treating RN: January 11, 1924 (87 y.o. [name] Primary Care Provider: Sherrie Mustache Other Clinician: Referring Provider: Treating Provider/Extender: Deloria Lair in Treatment: 34 Constitutional Sitting or standing Blood Pressure is within target range for patient.. Pulse regular and within target range for patient.Marland Kitchen Respirations regular, non-labored and within target range.. Temperature is normal and within the target range for the patient.Marland Kitchen Appears in no distress. Notes Wound exam; the patient's right first toe is mummified to the interphalangeal joint however it does not look to be infected there is no streaking erythema The area on the right plantar heel is measuring smaller still requires debridement with a #3 curette of surrounding skin subcutaneous tissue instrument is debris over the surface wound cleans up quite nicely He had a new area on the left medial fifth toe near the tip. The original wound at the base from last time is closed however he has this new open area I removed skin and subcutaneous tissue from the tip of the wound Electronic Signature(s) Signed: 05/30/2020 5:13:40 PM By: Linton Ham MD Entered By: Linton Ham on 05/30/2020 14:09:28 -------------------------------------------------------------------------------- Physician Orders Details Patient Name: Date of Service: MA [name] Fuchs E. 05/30/2020 1:00 PM Medical Record Number: [id number] Patient Account Number: [id number] Date of Birth/Sex: Treating RN: 12-Dec-1923 (87 y.o. [name] Primary Care Provider: Sherrie Mustache Other Clinician: Referring Provider: Treating  Provider/Extender: Deloria Lair in Treatment: 71 Verbal / Phone Orders: No Diagnosis Coding ICD-10 Coding Code Description E11.621 Type 2 diabetes mellitus with foot ulcer L97.514 Non-pressure chronic ulcer of other part of right foot with necrosis of bone E11.40 Type 2 diabetes mellitus with diabetic neuropathy, unspecified L97.412 Non-pressure chronic ulcer of right heel and midfoot with fat layer exposed L97.521 Non-pressure chronic ulcer of other part of left foot  limited to breakdown of skin Follow-up Appointments Return appointment in 1 month. Bathing/ Shower/ Hygiene May shower and wash wound with soap and water. - with dressing changes Edema Control - Lymphedema / SCD / Other Elevate legs to the level of the heart or above for 30 minutes daily and/or when sitting, a frequency of: Avoid standing for long periods of time. Moisturize legs daily. Additional Orders / Instructions Other: - Apply Desitin Zinc oxide redness, excoriated areas to buttock area daily and as needed for protection. Home Health New wound care orders this week; continue Home Health for wound care. May utilize formulary equivalent dressing for wound treatment orders unless otherwise specified. - Advance home health to change dressings weekly. Wound Treatment Wound #2 - T Great oe Wound Laterality: Right Cleanser: Soap and Water (Home Health) 1 x Per Day/30 Days Discharge Instructions: May shower and wash wound with dial antibacterial soap and water prior to dressing change. Prim Dressing: betadine (Home Health) 1 x Per Day/30 Days ary Discharge Instructions: to toe daily Wound #3 - Calcaneus Wound Laterality: Right Cleanser: Soap and Water [facility]) Every Other Day/30 Days Discharge Instructions: May shower and wash wound with dial antibacterial soap and water prior to dressing change. Prim Dressing: FIBRACOL Plus Dressing, 2x2 in (collagen) [facility]) Every Other Day/30  Days ary Discharge Instructions: home health to supply hydrogel if possible. Moisten collagen with saline or hydrogel Secondary Dressing: ALLEVYN Heel 4 1/2in x 5 1/2in / 10.5cm x 13.5cm [facility]) Every Other Day/30 Days Discharge Instructions: Apply over primary dressing as directed. Secured With: [employer], 4.5x3.1 (in/yd) (Generic) Every Other Day/30 Days Discharge Instructions: Secure with Kerlix as directed. Secured With: Paper Tape, 2x10 (in/yd) (Generic) Every Other Day/30 Days Discharge Instructions: Secure dressing with tape as directed. Wound #6 - T Fifth oe Wound Laterality: Left Prim Dressing: Maxorb Extra Calcium Alginate 2x2 in [facility]) Every Other Day/30 Days ary Discharge Instructions: Apply calcium alginate to wound bed as instructed Secondary Dressing: Woven Gauze Sponges 2x2 in [facility]) Every Other Day/30 Days Discharge Instructions: Apply over primary dressing as directed. Secured With: Child psychotherapist, Sterile 2x75 (in/in) (Home Health) Every Other Day/30 Days Discharge Instructions: Ensure to take ensure the toe is does not bend. .Secure with stretch gauze as directed. Electronic Signature(s) Signed: 05/30/2020 5:13:40 PM By: Linton Ham MD Signed: 05/30/2020 6:08:01 PM By: Deon Pilling Entered By: Deon Pilling on 05/30/2020 14:03:38 -------------------------------------------------------------------------------- Problem List Details Patient Name: Date of Service: MA [name] Fuchs E. 05/30/2020 1:00 PM Medical Record Number: [id number] Patient Account Number: [id number] Date of Birth/Sex: Treating RN: [birth year] (87 y.o. Lorette Ang, Meta.Reding Primary Care Provider: Sherrie Mustache Other Clinician: Referring Provider: Treating Provider/Extender: Deloria Lair in Treatment: 76 Active Problems ICD-10 Encounter Code Description Active Date MDM Diagnosis E11.621 Type 2 diabetes mellitus with foot  ulcer 07/25/2019 No Yes L97.514 Non-pressure chronic ulcer of other part of right foot with necrosis of bone 07/25/2019 No Yes E11.40 Type 2 diabetes mellitus with diabetic neuropathy, unspecified 07/25/2019 No Yes L97.412 Non-pressure chronic ulcer of right heel and midfoot with fat layer exposed 02/13/2020 No Yes L97.521 Non-pressure chronic ulcer of other part of left foot limited to breakdown of 05/02/2020 No Yes skin Inactive Problems ICD-10 Code Description Active Date Inactive Date S40.812D Abrasion of left upper arm, subsequent encounter 02/13/2020 02/13/2020 S40.811D Abrasion of right upper arm, subsequent encounter 02/13/2020 02/13/2020 Resolved Problems Electronic Signature(s) Signed: 05/30/2020 5:13:40 PM By: Linton Ham MD  Entered By: Linton Ham on 05/30/2020 14:05:40 -------------------------------------------------------------------------------- Progress Note Details Patient Name: Date of Service: [name] 05/30/2020 1:00 PM Medical Record Number: [id number] Patient Account Number: [id number] Date of Birth/Sex: Treating RN: 1923/06/12 (87 y.o. [name] Primary Care Provider: Sherrie Mustache Other Clinician: Referring Provider: Treating Provider/Extender: Deloria Lair in Treatment: 22 Subjective History of Present Illness (HPI) ADMISSION 05/02/2019 This is a 88 year old man accompanied by his daughter. He lives in [place] with his elderly wife. He has home caregivers as well. Apparently on February 28, 2019 they noted blood on his sock and noticed a wound on the tip of his right great toe. He has been cared for by Dr. Posey Pronto who is his podiatrist in [place]. Apparently they have been using Betadine to this area. The patient has a thick mycotic nail on the right as well which is embedded in the wound in some areas. Fortunately the patient has diabetic neuropathy and is not really experiencing any pain  otherwise I think this would all be quite painful. The patient was previously treated in 2019 for wounds on the left foot. This included a fairly thorough vascular review by vein and vascular Dr. Oneida Alar. His ABIs at that time were noncompressible bilaterally TBI on the right was 0.88 with biphasic waveforms on the right and monophasic waveforms at the left. He ended up having amputations of the left first and second toes. He did have an angiogram on 06/25/2017 which showed on the right the right common femoral profundofemoral for Morris and superficial femoral arteries were widely patent the right popliteal was widely patent. Tibial vessels were not well opacified secondary to motion artifact however he was felt to have all 3 tibial vessels intact with some calcification and some areas of stenosis within the posterior tibial artery but relatively good flow to the right lower leg. He also had the left reviewed as well which was the source of the problem at that time. Past medical history; prostate cancer on oral antiandrogen therapy with bone mets, GI stromal tumor in 2005, type 2 diabetes with peripheral neuropathy, hypertension, atrial fibrillation on Eliquis, history of non-Hodgkin's lymphoma treated with CHOP in 2005. He had amputations of the left first and second toes secondary to osteomyelitis. Does not appear that he has had an x-ray of the right foot ABI in our clinic on the right was noncompressible [see full arterial discussion above]. 1/19; area on the tip of the right great toe under a very mycotic nail tip. We use silver alginate. 1/26; area of the tip of the right great toe under a very mycotic nail. A lot of this seems to have closed down we have been using silver alginate 2/9; the areas on the tip of the right great toe under a very mycotic nail. A lot of this continues to close down. I do not think there is a subungual problem. I am hopeful that this mycotic nail which is thick and  raised will allow full epithelialization of the wound 2/23; this was a difficult area on the tip of his right great toe. Thick mycotic nail over the base of the wound. This appears to have closed down now. The nail itself is thick split with considerable subungual debris. READMISSION 07/25/2019 This is a 87 year old man that we cared for earlier this year in the clinic without a wound on the tip of his right great toe under a very mycotic toenail. He is a type II  diabetic. His ABIs have been noncompressible however he has been felt to have adequate blood flow in the right to heal the wound. We were able to get this wound to close over as I understand things he followed with Dr. Caprice Beaver had some debridement of the nail everything looks fine and apparently the wound bed was washed off by his wife over the weekend and by Monday it was open. His daughter is able to show me pictures. Unfortunately this now has exposed bone raising the possibility that he has had underlying osteomyelitis all along. He has been using Silvadene cream on this he has been referred back to the clinic by Dr. Caprice Beaver. He had x-rays of the right foot that did not show evidence of osteomyelitis in the right foot. He has not been on antibiotics He also had an x-ray of the left foot that showed findings suspicious for osteomyelitis involving the residual base of the first proximal phalanx and the proximal phalanx of the left second toe as well as the left second metatarsal head however he has no wounds in this area. 4/12; culture from last time of bone in the right great toe was negative. The patient's wife apparently scrubbed this toe vigorously and according to his daughter reopen the wound however unfortunately it is all exposed bone last time. I removed some of this and we use Prisma unfortunately the area does not look much better today. Previous x-rays done in podiatry office Dr. Caprice Beaver did not show evidence of osteomyelitis.  Nevertheless I think osteomyelitis here is probably quite likely. Still using Prisma 4/26; he took 2 weeks of doxycycline although his daughter is telling me he is not eating. He has loose bowel movements however I did not get the sense that this is changed all that much. He still has exposed bone. Culture I did last time showed a few coag negative staph I do not think that this is necessarily relevant. I still am operating under the premise that the patient probably has osteomyelitis. After considerable difficulty we are able to determine he is not allergic to cephalosporins I therefore gave him Keflex. 5/10; is tolerating Keflex 500 3 times daily quite well. He still has the small wound in the tip of his toe with exposed bone.Marland Kitchen Unfortunately he does not really have any granulation tissue. He saw Dr. Caprice Beaver of podiatry in [place] last week I have not had a chance to look at his notes The patient had an extensive vascular work-up including an angiogram in 2019. He was felt to have adequate blood supply on the right at the time although he did have amputations of the left first and second toe by Dr. Oneida Alar. 09/11/19 -Patient is back and is about to finish his Keflex, appointment with Dr. Oneida Alar is in the works. The right great toe wound looks about the same, the left third toe dorsal wound has healed. There was a small eschar covering it that was removed by podiatry.According to patient's daughter the right great toe base on the plantar surface had some redness that seems to have disappeared. 10/13/2019; patient is back in clinic today after 1 month hiatus although I have not seen him since 5/10. Since he was last here he was admitted to hospital from 6/2 through 6/5 because of a nonhealing wound on his right great toe. He underwent an angiogram which showed patent common femoral, profunda, SFA above and below-knee popliteal artery. The dominant runoff in the right lower extremity was through the  anterior tibial but that became very small at the level of the ankle and had minimal outflow into the foot. The peroneal artery was patent but atretic. 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 Interventions were attempted on the posterior tibial. Mechanical orbital atherectomy down to the level of the ankle and then this was angioplastied with 2.3 mm and 3 mm balloons. Flow remained very sluggish down the posterior tibial artery but was patent. Post event there apparently was embolization of an area involving the plantar left heel and sides of the left heel. He arrives back in clinic today it has been almost 7 weeks since I have seen him. Miraculously the great toe area with open bone is closed over probably as result of Dr. Ainsley Spinner interventions. He does however have an extensive ischemic area on the plantar and medial aspect of his heel. I reviewed Dr. Ainsley Spinner last note from 3 days ago on 6/22. He did not think there was enough inflow to heal the heel wound. With eschar and sloughing. Wondered whether he could heal a below-knee amputation but more likely an above the knee amputation when required. He was placed on Xeroform to the right heel In discussing things with the patient's family and the patient present he is not in a lot of pain. The area does not appear to be grossly infected. The patient appears to be comfortable and conversational. He is eating and drinking fairly well. 7/9; the patient saw Dr. Carlis Abbott on 6/29. He had previously done heroic attempt to establish blood flow as described above. 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 Although the eschar on the heel looks ominous both plantar and medial it is certainly no worse than last week. We will  continue Betadine to this area with alginate. Ultimately this will separate off and we will see about the viability of the tissue underneath. Also in clinic today our nurses were washing his right great toe tip apparently some of the skin came off eschared and there is an open wound at the tip of the great toe with exposed bone similar to what he had previously 7/23; he has the open area on the tip of his first toe which I think is mostly bone. Then he has the embolic area on the plantar heel extending into the medial heel. We have been using Betadine and silver alginate to the heel silver alginate to the toe. His daughter brings up that she was given nitroglycerin patches by Dr. Donzetta Matters to try and open blood flow to the foot. She has not been using them out of concern of systemic hypotension and also that the labels said not to put them on extremities 8/10-Returns at 3 weeks, open area on the tip of his first toe that is dried out, the heel with eschar, applying Betadine to the eschar area and silver alginate dressing. 9/7; 1 month follow-up. He has an open area on the tip of the toe almost subungual with a thick mycotic nail. The heel wound has black eschar but this is beginning to separate. We have been using Betadine and silver alginate on the heel and simply silver alginate on the right great toe 9/23; the patient was worked in urgently at the request of his wife was concerned about infection in the tip of  the toe. Apparently this was brought up by home health. 10/14; the patient has the area of exposed bone on the tip of the right great toe. He also has the eschared area on the plantar heel which was a embolic phenomenon at the time of attempted revascularization. He also had an area of denuded epithelium over both buttocks which I think is probably a combination of pressure friction and moisture. They have been using zinc oxide which is appropriate 10/26. The patient has an area of wider  exposed bone at the tip of the right great toe. The substantial eschared area on the heel was already 50% separated I remove this. Actually the patient's wife had called repetitively for urgent appointments with some degree of erythema in the foot as the primary concern. It turns out that she has been to see her primary doctor after the patient had a fall off the commode resulting in skin tears to both forearms. He was placed on doxycycline because of concern of cellulitis of the toe. She is also been to see [facility] in [place] with regards to the toe. Apparently he shaved the bone and recommended Betadine to this. Noteworthy that the patient already had an extensive arterial evaluation with an angiogram. He has known PAD with tibial vessel disease. The last effort was an attempt to open up the posterior tibial artery which resulted in symptoms of embolization to the heel. 11/9; since the patient was last here he was brought to the ER on 11/4. He was felt to have cellulitis in the right great toe. They did not do a wound culture however they did blood cultures which showed 3 out of 3 Staph hominis. He has been treated with clindamycin 150 mg 3 times times a day. His daughter says he went to the hospital with altered LOC. He had been running a fever but was not febrile on arrival to the ER. Looking over the emergency room notes he had erythema in his foot and I think he had cellulitis. He had apparently been on doxycycline 100 mg twice daily prescribed by Dr. Caprice Beaver prior to this. The staph isolate itself is very resistant it is sensitive to vancomycin but resistant to methicillin's, quinolones, tetracycline and Septra. During the stay in the ER she was seen by vein and vascular they noted that he does not have any further options for revascularization. They do not think that he could undergo any foot conserving surgery therefore the only thing left here would be a BKA The area of the  heel that I debrided last time he is here actually looks quite healthy nice healthy looking granulation tissue over perhaps 60 to 70% of the wound still 30% slough we have been using Iodoflex. They have been using silver collagen to the toe The other issue here is the daughter is struggling with the idea of in-home hospice. I went over this with her. I emphasized that hospice is not a home care agency as a velocity of care at the end of life. Patient is 87 years old he apparently is eating and drinking less and since I have known him he has become more frail 03/11/2020 upon evaluation today patient unfortunately is showing signs of progression in regard to his right great toe. Currently he is actually experiencing increased evidence of skin deterioration and in fact this toe is developing into more of a dry gangrene issue at this point. He was signed up for hospice on Thursday and then apparently hospice  was called off on Friday due to the fact that they were talking about doing wet-to-dry dressings to both wound locations and his daughter was really concerned about this. She did not want anything to make the wounds progress faster than they should be. With that being said I think a wet-to-dry dressing would be okay for the heel location. I do not believe this would cause any harm. With that being said and in regard to the toe I really do agree I would not really want a wet-to-dry dressing here I think more of like Betadine and dry gauze dressing could be a appropriate way to go after the toe was painted with Betadine that is. 12/9. It has been a month since I saw this man. He was using Betadine to the great toe and Iodoflex to the area on the heel. The heel actually look quite good last time I saw this this is gotten larger and not nearly as healthy looking as last time. Moreover the right first toe wound now has dry gangrene Since they were last here they have not elected to sign up for hospice. They  do not have home health. They are trying to get some home care through the [facility] 1/13; the patient's right first toe is mummified to the inter phalangeal joint. The area on the plantar aspect of his right heel actually measures quite a bit better. We have been using Iodoflex His daughter points out an area on the plantar aspect of the left 5th toe at roughly the level of the proximal inner phalangeal 2/10; right first toe remains mummified there is no change in this no evidence of infection no erythema spreading proximally into the foot ooHe still has the area on the plantar aspect of his right heel. This requires a debridement. We have been using silver alginate ooHe had an area last time at the base of his left fifth toe. This is healed however he has a new area on the medial part of the left fifth toe. This also required debridement. We have been using silver alginate largely to all wound areas Objective Constitutional Sitting or standing Blood Pressure is within target range for patient.. Pulse regular and within target range for patient.Marland Kitchen Respirations regular, non-labored and within target range.. Temperature is normal and within the target range for the patient.Marland Kitchen Appears in no distress. Vitals Time Taken: 1:32 PM, Height: 70 in, Weight: 140 lbs, BMI: 20.1, Temperature: 97.7 F, Pulse: 56 bpm, Respiratory Rate: 16 breaths/min, Blood Pressure: 110/50 mmHg. General Notes: Wound exam; the patient's right first toe is mummified to the interphalangeal joint however it does not look to be infected there is no streaking erythema ooThe area on the right plantar heel is measuring smaller still requires debridement with a #3 curette of surrounding skin subcutaneous tissue instrument is debris over the surface wound cleans up quite nicely [facility] had a new area on the left medial fifth toe near the tip. The original wound at the base from last time is closed however he has this new open area I removed skin  and subcutaneous tissue from the tip of the wound Integumentary (Hair, Skin) Wound #2 status is Open. Original cause of wound was Gradually Appeared. The wound is located on the Right T Great. The wound measures 4cm length x oe 8cm width x 0.1cm depth; 25.133cm^2 area and 2.513cm^3 volume. There is no tunneling or undermining noted. There is a none present amount of drainage noted. The wound margin is indistinct  and nonvisible. There is no granulation within the wound bed. There is a large (67-100%) amount of necrotic tissue within the wound bed including Eschar. Wound #3 status is Open. Original cause of wound was Gradually Appeared. The wound is located on the Right Calcaneus. The wound measures 2cm length x 2.5cm width x 0.1cm depth; 3.927cm^2 area and 0.393cm^3 volume. There is Fat Layer (Subcutaneous Tissue) exposed. There is no tunneling or undermining noted. There is a medium amount of serosanguineous drainage noted. The wound margin is flat and intact. There is large (67-100%) pink granulation within the wound bed. There is a small (1-33%) amount of necrotic tissue within the wound bed including Adherent Slough. Wound #6 status is Open. Original cause of wound was Pressure Injury. The wound is located on the Left T Fifth. The wound measures 0.2cm length x 0.2cm oe width x 0.1cm depth; 0.031cm^2 area and 0.003cm^3 volume. There is Fat Layer (Subcutaneous Tissue) exposed. There is no tunneling or undermining noted. There is a small amount of serosanguineous drainage noted. The wound margin is distinct with the outline attached to the wound base. There is small (1-33%) pink granulation within the wound bed. There is a large (67-100%) amount of necrotic tissue within the wound bed including Eschar. Assessment Active Problems ICD-10 Type 2 diabetes mellitus with foot ulcer Non-pressure chronic ulcer of other part of right foot with necrosis of bone Type 2 diabetes mellitus with diabetic  neuropathy, unspecified Non-pressure chronic ulcer of right heel and midfoot with fat layer exposed Non-pressure chronic ulcer of other part of left foot limited to breakdown of skin Procedures Wound #3 Pre-procedure diagnosis of Wound #3 is a Pressure Ulcer located on the Right Calcaneus .Severity of Tissue Pre Debridement is: Fat layer exposed. There was a Excisional Skin/Subcutaneous Tissue Debridement with a total area of 5 sq cm performed by Ricard Dillon., MD. With the following instrument(s): Curette to remove Viable and Non-Viable tissue/material. Material removed includes Subcutaneous Tissue, Slough, Skin: Dermis, Skin: Epidermis, and Fibrin/Exudate after achieving pain control using Lidocaine 4% Topical Solution. A time out was conducted at 13:55, prior to the start of the procedure. A Minimum amount of bleeding was controlled with Pressure. The procedure was tolerated well with a pain level of 0 throughout and a pain level of 0 following the procedure. Post Debridement Measurements: 2cm length x 2.5cm width x 0.1cm depth; 0.393cm^3 volume. Post debridement Stage noted as Unstageable/Unclassified. Character of Wound/Ulcer Post Debridement is improved. Severity of Tissue Post Debridement is: Fat layer exposed. Post procedure Diagnosis Wound #3: Same as Pre-Procedure Wound #6 Pre-procedure diagnosis of Wound #6 is a Pressure Ulcer located on the Left T Fifth .Severity of Tissue Pre Debridement is: Limited to breakdown of skin. oe There was a Excisional Skin/Subcutaneous Tissue Debridement with a total area of 0.04 sq cm performed by Ricard Dillon., MD. With the following instrument(s): Curette to remove Viable and Non-Viable tissue/material. Material removed includes Subcutaneous Tissue, Slough, Skin: Dermis, Skin: Epidermis, and Fibrin/Exudate after achieving pain control using Lidocaine 4% Topical Solution. A time out was conducted at 13:55, prior to the start of the procedure.  A Minimum amount of bleeding was controlled with Pressure. The procedure was tolerated well with a pain level of 0 throughout and a pain level of 0 following the procedure. Post Debridement Measurements: 0.2cm length x 0.2cm width x 0.1cm depth; 0.003cm^3 volume. Post debridement Stage noted as Category/Stage II. Character of Wound/Ulcer Post Debridement is improved. Severity of Tissue Post Debridement  is: Limited to breakdown of skin. Post procedure Diagnosis Wound #6: Same as Pre-Procedure Plan Follow-up Appointments: Return appointment in 1 month. Bathing/ Shower/ Hygiene: May shower and wash wound with soap and water. - with dressing changes Edema Control - Lymphedema / SCD / Other: Elevate legs to the level of the heart or above for 30 minutes daily and/or when sitting, a frequency of: Avoid standing for long periods of time. Moisturize legs daily. Additional Orders / Instructions: Other: - Apply Desitin Zinc oxide redness, excoriated areas to buttock area daily and as needed for protection. Home Health: New wound care orders this week; continue Home Health for wound care. May utilize formulary equivalent dressing for wound treatment orders unless otherwise specified. - Advance home health to change dressings weekly. WOUND #2: - T Great Wound Laterality: Right oe Cleanser: Soap and Water (Home Health) 1 x Per Day/30 Days Discharge Instructions: May shower and wash wound with dial antibacterial soap and water prior to dressing change. Prim Dressing: betadine (Home Health) 1 x Per Day/30 Days ary Discharge Instructions: to toe daily WOUND #3: - Calcaneus Wound Laterality: Right Cleanser: Soap and Water [facility]) Every Other Day/30 Days Discharge Instructions: May shower and wash wound with dial antibacterial soap and water prior to dressing change. Prim Dressing: FIBRACOL Plus Dressing, 2x2 in (collagen) [facility]) Every Other Day/30 Days ary Discharge Instructions: home  health to supply hydrogel if possible. Moisten collagen with saline or hydrogel Secondary Dressing: ALLEVYN Heel 4 1/2in x 5 1/2in / 10.5cm x 13.5cm [facility]) Every Other Day/30 Days Discharge Instructions: Apply over primary dressing as directed. Secured With: [employer], 4.5x3.1 (in/yd) (Generic) Every Other Day/30 Days Discharge Instructions: Secure with Kerlix as directed. Secured With: Paper T ape, 2x10 (in/yd) (Generic) Every Other Day/30 Days Discharge Instructions: Secure dressing with tape as directed. WOUND #6: - T Fifth Wound Laterality: Left oe Prim Dressing: Maxorb Extra Calcium Alginate 2x2 in [facility]) Every Other Day/30 Days ary Discharge Instructions: Apply calcium alginate to wound bed as instructed Secondary Dressing: Woven Gauze Sponges 2x2 in [facility]) Every Other Day/30 Days Discharge Instructions: Apply over primary dressing as directed. Secured With: Child psychotherapist, Sterile 2x75 (in/in) (Home Health) Every Other Day/30 Days Discharge Instructions: Ensure to take ensure the toe is does not bend. .Secure with stretch gauze as directed. 1. Still using silver alginate to all wounds 2. The mummified right first toe looks stable no evidence of infection he is not in any pain Electronic Signature(s) Signed: 05/30/2020 5:13:40 PM By: Linton Ham MD Entered By: Linton Ham on 05/30/2020 14:10:19 -------------------------------------------------------------------------------- SuperBill Details Patient Name: Date of Service: MA [name] Fuchs E. 05/30/2020 Medical Record Number: [id number] Patient Account Number: [id number] Date of Birth/Sex: Treating RN: 1924/03/25 (87 y.o. 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 L97.521 Non-pressure chronic ulcer of other part of left foot limited to breakdown of skin Facility Procedures CPT4 Code: 81017510 Description: 25852 - DEB SUBQ TISSUE 20 SQ CM/< ICD-10 Diagnosis Description 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

## 2020-05-30 NOTE — Progress Notes (Signed)
[name], [name] ([id number]) Visit Report for 05/30/2020 Arrival Information Details Patient Name: Date of Service: [name] 05/30/2020 1:00 PM Medical Record Number: [id number] Patient Account Number: [id number] Date of Birth/Sex: Treating RN: 06-Nov-1923 (87 y.o. [name] Primary Care Aariana Foster: [name] Other Clinician: Referring [name]: Treating [name]/Extender: [name] in Treatment: 65 Visit Information History Since Last Visit Added or deleted any medications: No Patient Arrived: Wheel Chair Any new allergies or adverse reactions: No Arrival Time: 13:28 Had a fall or experienced change in No Accompanied By: caregivers activities of daily living that may affect Transfer Assistance: None risk of falls: Patient Identification Verified: Yes Signs or symptoms of abuse/neglect since last visito No Secondary Verification Process Completed: Yes Hospitalized since last visit: No Patient Requires Transmission-Based Precautions: No Implantable device outside of the clinic excluding No Patient Has Alerts: Yes cellular tissue based products placed in the center Patient Alerts: R ABI non compressible since last visit: Has Dressing in Place as Prescribed: Yes Pain Present Now: No Electronic Signature(s) Signed: 05/30/2020 5:58:12 PM By: [name] Hurst RN, BSN Entered By: [name] on 05/30/2020 13:32:07 -------------------------------------------------------------------------------- Encounter Discharge Information Details Patient Name: Date of Service: MA [name] Fuchs E. 05/30/2020 1:00 PM Medical Record Number: [id number] Patient Account Number: [id number] Date of Birth/Sex: Treating RN: Mar 06, 1924 (87 y.o. [name] Primary Care [name]: [name] Other Clinician: Referring [name]: Treating [name]/Extender: [name] in Treatment: 74 Encounter Discharge Information Items  Post Procedure Vitals Discharge Condition: Stable Temperature (F): 97.7 Ambulatory Status: Wheelchair Pulse (bpm): 56 Discharge Destination: Home Respiratory Rate (breaths/min): 16 Transportation: Private Auto Blood Pressure (mmHg): 110/50 Accompanied By: caregiver Schedule Follow-up Appointment: Yes Clinical Summary of Care: Patient Declined Electronic Signature(s) Signed: 05/30/2020 2:35:36 PM By: [name] EMT/HBOT/SD Entered By: [name] on 05/30/2020 14:35:18 -------------------------------------------------------------------------------- Lower Extremity Assessment Details Patient Name: Date of Service: MA [name] Fuchs E. 05/30/2020 1:00 PM Medical Record Number: [id number] Patient Account Number: [id number] Date of Birth/Sex: Treating RN: [birth year] (87 y.o. [name] Primary Care [name]: [name] Other Clinician: Referring [name]: Treating [name]/Extender: [name] in Treatment: 44 Edema Assessment Assessed: [name]: No] [name]: No] Edema: [Left: N] [Right: o] Calf Left: Right: Point of Measurement: From Medial Instep 26 cm Ankle Left: Right: Point of Measurement: From Medial Instep 20 cm Vascular Assessment Pulses: Dorsalis Pedis Palpable: [Right:Yes] Electronic Signature(s) Signed: 05/30/2020 5:58:12 PM By: [name] Hurst RN, BSN Entered By: [name] on 05/30/2020 13:32:50 -------------------------------------------------------------------------------- Multi-Disciplinary Care Plan Details Patient Name: Date of Service: MA [name] Fuchs E. 05/30/2020 1:00 PM Medical Record Number: [id number] Patient Account Number: [id number] Date of Birth/Sex: Treating RN: [birth year] (87 y.o. [name] Primary Care [name]: [name] Other Clinician: Referring Janely Gullickson: Treating [name]/Extender: [name] in Treatment: 27 Active Inactive Wound/Skin  Impairment Nursing Diagnoses: Knowledge deficit related to ulceration/compromised skin integrity Goals: Patient/caregiver will verbalize understanding of skin care regimen Date Initiated: 07/25/2019 Target Resolution Date: 06/21/2020 Goal Status: Active Ulcer/skin breakdown will have a volume reduction of 30% by week 4 Date Initiated: 07/25/2019 Date Inactivated: 08/28/2019 Target Resolution Date: 08/25/2019 Goal Status: Unmet Unmet Reason: Osteomyelitis Interventions: Assess patient/caregiver ability to obtain necessary supplies Assess patient/caregiver ability to perform ulcer/skin care regimen upon admission and as needed Assess ulceration(s) every visit Notes: Electronic Signature(s) Signed: 05/30/2020 6:08:01 PM By: [name] Entered By: [name] on 05/30/2020 13:59:42 -------------------------------------------------------------------------------- Pain Assessment Details Patient Name: Date of  Service: [name], [name] Larsson NK E. 05/30/2020 1:00 PM Medical Record Number: [id number] Patient Account Number: [id number] Date of Birth/Sex: Treating RN: November 13, 1923 (87 y.o. [name] Primary Care [name]: [name] Other Clinician: Referring [name]: Treating [name]/Extender: [name] in Treatment: 69 Active Problems Location of Pain Severity and Description of Pain Patient Has Paino No Site Locations Pain Management and Medication Current Pain Management: Electronic Signature(s) Signed: 05/30/2020 5:58:12 PM By: [name] Hurst RN, BSN Entered By: [name] on 05/30/2020 13:32:45 -------------------------------------------------------------------------------- Patient/Caregiver Education Details Patient Name: Date of Service: [name] 2/10/2022andnbsp1:00 PM Medical Record Number: [id number] Patient Account Number: [id number] Date of Birth/Gender: Treating RN: [birth year] (87 y.o. [name] Primary Care  Physician: [name] Other Clinician: Referring Physician: Treating Physician/Extender: [name] in Treatment: 31 Education Assessment Education Provided To: Patient Education Topics Provided Wound/Skin Impairment: Handouts: Skin Care Do's and Dont's Methods: Explain/Verbal Responses: Reinforcements needed Electronic Signature(s) Signed: 05/30/2020 6:08:01 PM By: [name] Entered By: [name] on 05/30/2020 13:59:54 -------------------------------------------------------------------------------- Wound Assessment Details Patient Name: Date of Service: [name] 05/30/2020 1:00 PM Medical Record Number: [id number] Patient Account Number: [id number] Date of Birth/Sex: Treating RN: Oct 24, 1923 (87 y.o. [name] Primary Care Ikram Riebe: [name] Other Clinician: Referring Jeanee Fabre: Treating Yvana Samonte/Extender: [name] in Treatment: 44 Wound Status Wound Number: 2 Primary Diabetic Wound/Ulcer of the Lower Extremity Etiology: Wound Location: Right T Great oe Wound Open Wounding Event: Gradually Appeared Status: Date Acquired: 06/15/2019 Comorbid Cataracts, Arrhythmia, Hypertension, Peripheral Arterial Disease, Weeks Of Treatment: 44 History: Type II Diabetes, Osteomyelitis, Dementia, Neuropathy, Received Clustered Wound: No Chemotherapy Wound Measurements Length: (cm) 4 Width: (cm) 8 Depth: (cm) 0.1 Area: (cm) 25.133 Volume: (cm) 2.513 % Reduction in Area: -26637.2% % Reduction in Volume: -8875% Epithelialization: None Tunneling: No Undermining: No Wound Description Classification: Grade 4 Wound Margin: Indistinct, nonvisible Exudate Amount: None Present Foul Odor After Cleansing: No Slough/Fibrino No Wound Bed Granulation Amount: None Present (0%) Exposed Structure Necrotic Amount: Large (67-100%) Fascia Exposed: No Necrotic Quality: Eschar Fat Layer  (Subcutaneous Tissue) Exposed: No Tendon Exposed: No Muscle Exposed: No Joint Exposed: No Bone Exposed: No Treatment Notes Wound #2 (Toe Great) Wound Laterality: Right Cleanser Soap and Water Discharge Instruction: May shower and wash wound with dial antibacterial soap and water prior to dressing change. Peri-Wound Care Topical Primary Dressing betadine Discharge Instruction: to toe daily Secondary Dressing Secured With Compression Wrap Compression Stockings Add-Ons Electronic Signature(s) Signed: 05/30/2020 5:58:12 PM By: [name] Hurst RN, BSN Entered By: [name] on 05/30/2020 13:43:12 -------------------------------------------------------------------------------- Wound Assessment Details Patient Name: Date of Service: MA [name] Fuchs E. 05/30/2020 1:00 PM Medical Record Number: [id number] Patient Account Number: [id number] Date of Birth/Sex: Treating RN: 02/25/1924 (87 y.o. [name] Primary Care Grant Henkes: [name] Other Clinician: Referring Anis Degidio: Treating Tabytha Gradillas/Extender: [name] in Treatment: 44 Wound Status Wound Number: 3 Primary Pressure Ulcer Etiology: Wound Location: Right Calcaneus Secondary Arterial Insufficiency Ulcer Wounding Event: Gradually Appeared Etiology: Date Acquired: 09/21/2019 Wound Open Weeks Of Treatment: 32 Status: Clustered Wound: No Comorbid Cataracts, Arrhythmia, Hypertension, Peripheral Arterial Disease, History: Type II Diabetes, Osteomyelitis, Dementia, Neuropathy, Received Chemotherapy Wound Measurements Length: (cm) 2 Width: (cm) 2.5 Depth: (cm) 0.1 Area: (cm) 3.927 Volume: (cm) 0.393 % Reduction in Area: 90.8% % Reduction in Volume: 90.8% Epithelialization: Medium (34-66%) Tunneling: No Undermining: No Wound Description Classification: Unstageable/Unclassified Wound Margin: Flat and Intact Exudate Amount: Medium Exudate  Type: Serosanguineous Exudate Color:  red, brown Foul Odor After Cleansing: No Slough/Fibrino Yes Wound Bed Granulation Amount: Large (67-100%) Exposed Structure Granulation Quality: Pink Fascia Exposed: No Necrotic Amount: Small (1-33%) Fat Layer (Subcutaneous Tissue) Exposed: Yes Necrotic Quality: Adherent Slough Tendon Exposed: No Muscle Exposed: No Joint Exposed: No Bone Exposed: No Treatment Notes Wound #3 (Calcaneus) Wound Laterality: Right Cleanser Soap and Water Discharge Instruction: May shower and wash wound with dial antibacterial soap and water prior to dressing change. Peri-Wound Care Topical Primary Dressing FIBRACOL Plus Dressing, 2x2 in (collagen) Discharge Instruction: home health to supply hydrogel if possible. Moisten collagen with saline or hydrogel Secondary Dressing ALLEVYN Heel 4 1/2in x 5 1/2in / 10.5cm x 13.5cm Discharge Instruction: Apply over primary dressing as directed. Secured With [employer], 4.5x3.1 (in/yd) Discharge Instruction: Secure with Kerlix as directed. Paper Tape, 2x10 (in/yd) Discharge Instruction: Secure dressing with tape as directed. Compression Wrap Compression Stockings Add-Ons Electronic Signature(s) Signed: 05/30/2020 5:58:12 PM By: [name] Hurst RN, BSN Entered By: [name] on 05/30/2020 13:43:25 -------------------------------------------------------------------------------- Wound Assessment Details Patient Name: Date of Service: MA [name] Fuchs E. 05/30/2020 1:00 PM Medical Record Number: [id number] Patient Account Number: [id number] Date of Birth/Sex: Treating RN: 1923-08-13 (87 y.o. [name] Primary Care Rondrick Barreira: [name] Other Clinician: Referring Kla Bily: Treating Freddye Cardamone/Extender: [name] in Treatment: 70 Wound Status Wound Number: 6 Primary Pressure Ulcer Etiology: Wound Location: Left T Fifth oe Secondary Diabetic Wound/Ulcer of the Lower Extremity Wounding Event: Pressure  Injury Etiology: Date Acquired: 05/01/2020 Wound Open Weeks Of Treatment: 4 Status: Clustered Wound: No Comorbid Cataracts, Arrhythmia, Hypertension, Peripheral Arterial Disease, History: Type II Diabetes, Osteomyelitis, Dementia, Neuropathy, Received Chemotherapy Wound Measurements Length: (cm) 0.2 Width: (cm) 0.2 Depth: (cm) 0.1 Area: (cm) 0.031 Volume: (cm) 0.003 % Reduction in Area: 56.3% % Reduction in Volume: 57.1% Epithelialization: Large (67-100%) Tunneling: No Undermining: No Wound Description Classification: Category/Stage II Wound Margin: Distinct, outline attached Exudate Amount: Small Exudate Type: Serosanguineous Exudate Color: red, brown Foul Odor After Cleansing: No Slough/Fibrino Yes Wound Bed Granulation Amount: Small (1-33%) Exposed Structure Granulation Quality: Pink Fascia Exposed: No Necrotic Amount: Large (67-100%) Fat Layer (Subcutaneous Tissue) Exposed: Yes Necrotic Quality: Eschar Tendon Exposed: No Muscle Exposed: No Joint Exposed: No Bone Exposed: No Treatment Notes Wound #6 (Toe Fifth) Wound Laterality: Left Cleanser Peri-Wound Care Topical Primary Dressing Maxorb Extra Calcium Alginate 2x2 in Discharge Instruction: Apply calcium alginate to wound bed as instructed Secondary Dressing Woven Gauze Sponges 2x2 in Discharge Instruction: Apply over primary dressing as directed. Secured With [employer] Stretch Gauze Bandage, Sterile 2x75 (in/in) Discharge Instruction: Ensure to take ensure the toe is does not bend. .Secure with stretch gauze as directed. Compression Wrap Compression Stockings Add-Ons Electronic Signature(s) Signed: 05/30/2020 5:58:12 PM By: [name] Hurst RN, BSN Entered By: [name] on 05/30/2020 13:43:38 -------------------------------------------------------------------------------- Vitals Details Patient Name: Date of Service: MA [name] Fuchs E. 05/30/2020 1:00 PM Medical Record Number:  [id number] Patient Account Number: [id number] Date of Birth/Sex: Treating RN: June 18, 1923 (87 y.o. [name] Primary Care Laquanda Bick: [name] Other Clinician: Referring Shena Vinluan: Treating Jolin Benavides/Extender: [name] in Treatment: 80 Vital Signs Time Taken: 13:32 Temperature (F): 97.7 Height (in): 70 Pulse (bpm): 56 Weight (lbs): 140 Respiratory Rate (breaths/min): 16 Body Mass Index (BMI): 20.1 Blood Pressure (mmHg): 110/50 Reference Range: 80 - 120 mg / dl Electronic Signature(s) Signed: 05/30/2020 5:58:12 PM By: [name] Hurst RN, BSN Entered By: [name] on 05/30/2020 13:32:41

## 2020-05-31 ENCOUNTER — Other Ambulatory Visit: Payer: Self-pay

## 2020-05-31 MED ORDER — METFORMIN HCL 850 MG PO TABS: 850.0000 mg | ORAL_TABLET | Freq: Two times a day (BID) | ORAL | 0 refills | Status: DC

## 2020-05-31 NOTE — Telephone Encounter (Signed)
Patient pharmacy has requested refill on medication "Metformin 850mg ". Patient last refill was 04/06/2017 by Venita Lick, LPN. Medication pend and sent to PCP [name], NP . Please Advise.

## 2020-06-03 ENCOUNTER — Encounter: Payer: Self-pay | Admitting: Nurse Practitioner

## 2020-06-03 ENCOUNTER — Telehealth: Payer: Self-pay | Admitting: *Deleted

## 2020-06-03 NOTE — Telephone Encounter (Signed)
Received letter from [facility] patient assistance foundation. Pt has been approved for the patient assistance foundation through 04/19/21.

## 2020-06-10 ENCOUNTER — Telehealth: Payer: Self-pay | Admitting: *Deleted

## 2020-06-10 ENCOUNTER — Other Ambulatory Visit: Payer: Self-pay | Admitting: Nurse Practitioner

## 2020-06-10 DIAGNOSIS — R413 Other amnesia: Secondary | ICD-10-CM

## 2020-06-10 NOTE — Telephone Encounter (Signed)
Bethena Roys with [facility] called with concerns:  1. Requesting verbal orders for Speech Therapy  2x2weeks and 1x3weeks. Verbal orders given.   2. Wondering why patient is not on Reflux medication. Stated that patient was on them in the past.   3. Wonders if patient needs to see GI again to have Esophogus stretched. Stated that this was done a couple times in the past. Stated that he is having increased cough  4. Not seeing a lot of change since the last swallowing study. More esophogeal, not sure the study would be beneficial but not against it. Having patient do swallowing exercises to help protect airway.   Please Advise.

## 2020-06-11 DIAGNOSIS — E1151 Type 2 diabetes mellitus with diabetic peripheral angiopathy without gangrene: Secondary | ICD-10-CM | POA: Diagnosis not present

## 2020-06-11 DIAGNOSIS — E1142 Type 2 diabetes mellitus with diabetic polyneuropathy: Secondary | ICD-10-CM | POA: Diagnosis not present

## 2020-06-11 DIAGNOSIS — B351 Tinea unguium: Secondary | ICD-10-CM | POA: Diagnosis not present

## 2020-06-11 NOTE — Telephone Encounter (Signed)
Please have Bethena Roys with [facility] discuss these concerns with patient, pts daughter and wife. If they would like to set up an appt to discuss we certainly can do so. Also they can always set up a follow up with GI if they want to discuss with that specialty as well.  Thank you

## 2020-06-11 NOTE — Telephone Encounter (Signed)
[name] notified and agreed.

## 2020-06-18 MED ORDER — OMEPRAZOLE 40 MG PO CPDR: 40.0000 mg | DELAYED_RELEASE_CAPSULE | Freq: Every day | ORAL | 3 refills | Status: DC

## 2020-06-27 ENCOUNTER — Encounter (HOSPITAL_BASED_OUTPATIENT_CLINIC_OR_DEPARTMENT_OTHER): Payer: Medicare Other | Attending: Internal Medicine | Admitting: Physician Assistant

## 2020-06-27 ENCOUNTER — Other Ambulatory Visit: Payer: Self-pay

## 2020-06-27 DIAGNOSIS — S40812D Abrasion of left upper arm, subsequent encounter: Secondary | ICD-10-CM | POA: Insufficient documentation

## 2020-06-27 DIAGNOSIS — L97521 Non-pressure chronic ulcer of other part of left foot limited to breakdown of skin: Secondary | ICD-10-CM | POA: Diagnosis not present

## 2020-06-27 DIAGNOSIS — L97514 Non-pressure chronic ulcer of other part of right foot with necrosis of bone: Secondary | ICD-10-CM | POA: Insufficient documentation

## 2020-06-27 DIAGNOSIS — Z7901 Long term (current) use of anticoagulants: Secondary | ICD-10-CM | POA: Diagnosis not present

## 2020-06-27 DIAGNOSIS — L97522 Non-pressure chronic ulcer of other part of left foot with fat layer exposed: Secondary | ICD-10-CM | POA: Diagnosis not present

## 2020-06-27 DIAGNOSIS — L97519 Non-pressure chronic ulcer of other part of right foot with unspecified severity: Secondary | ICD-10-CM | POA: Diagnosis present

## 2020-06-27 DIAGNOSIS — E11621 Type 2 diabetes mellitus with foot ulcer: Secondary | ICD-10-CM | POA: Insufficient documentation

## 2020-06-27 DIAGNOSIS — X58XXXD Exposure to other specified factors, subsequent encounter: Secondary | ICD-10-CM | POA: Diagnosis not present

## 2020-06-27 DIAGNOSIS — L97412 Non-pressure chronic ulcer of right heel and midfoot with fat layer exposed: Secondary | ICD-10-CM | POA: Diagnosis not present

## 2020-06-27 DIAGNOSIS — E1140 Type 2 diabetes mellitus with diabetic neuropathy, unspecified: Secondary | ICD-10-CM | POA: Diagnosis not present

## 2020-06-27 NOTE — Progress Notes (Signed)
[name], [name] ([id number]) Visit Report for 06/27/2020 Arrival Information Details Patient Name: Date of Service: [name] 06/27/2020 12:30 PM Medical Record Number: [id number] Patient Account Number: [id number] Date of Birth/Sex: Treating RN: 1923-05-20 (87 y.o. [name], [name] Primary Care [name]: Sherrie Mustache Other Clinician: Referring [name]: Treating [name]/Extender: Jerral Bonito in Treatment: 26 Visit Information History Since Last Visit Added or deleted any medications: No Patient Arrived: Wheel Chair Any new allergies or adverse reactions: No Arrival Time: 12:38 Had a fall or experienced change in No Accompanied By: family/cg activities of daily living that may affect Transfer Assistance: None risk of falls: Patient Identification Verified: Yes Signs or symptoms of abuse/neglect since last visito No Secondary Verification Process Completed: Yes Hospitalized since last visit: No Patient Requires Transmission-Based Precautions: No Implantable device outside of the clinic excluding No Patient Has Alerts: Yes cellular tissue based products placed in the center Patient Alerts: R ABI non compressible since last visit: Has Dressing in Place as Prescribed: Yes Pain Present Now: No Electronic Signature(s) Signed: 06/27/2020 5:24:28 PM By: Rhae Hammock RN Entered By: Rhae Hammock on 06/27/2020 12:39:25 -------------------------------------------------------------------------------- Clinic Level of Care Assessment Details Patient Name: Date of Service: [name] [name] 06/27/2020 12:30 PM Medical Record Number: [id number] Patient Account Number: [id number] Date of Birth/Sex: Treating RN: Apr 26, 1923 (87 y.o. [name] Primary Care [name]: Sherrie Mustache Other Clinician: Referring [name]: Treating [name]/Extender: Jerral Bonito in Treatment: 54 Clinic Level of Care Assessment  Items TOOL 4 Quantity Score X- 1 0 Use when only an EandM is performed on FOLLOW-UP visit ASSESSMENTS - Nursing Assessment / Reassessment X- 1 10 Reassessment of Co-morbidities (includes updates in patient status) X- 1 5 Reassessment of Adherence to Treatment Plan ASSESSMENTS - Wound and Skin A ssessment / Reassessment []  - 0 Simple Wound Assessment / Reassessment - one wound X- 3 5 Complex Wound Assessment / Reassessment - multiple wounds X- 1 10 Dermatologic / Skin Assessment (not related to wound area) ASSESSMENTS - Focused Assessment X- 2 5 Circumferential Edema Measurements - multi extremities X- 1 10 Nutritional Assessment / Counseling / Intervention []  - 0 Lower Extremity Assessment (monofilament, tuning fork, pulses) []  - 0 Peripheral Arterial Disease Assessment (using hand held doppler) ASSESSMENTS - Ostomy and/or Continence Assessment and Care []  - 0 Incontinence Assessment and Management []  - 0 Ostomy Care Assessment and Management (repouching, etc.) PROCESS - Coordination of Care []  - 0 Simple Patient / Family Education for ongoing care X- 1 20 Complex (extensive) Patient / Family Education for ongoing care X- 1 10 Staff obtains Consents, Records, T Results / Process Orders est X- 1 10 Staff telephones HHA, Nursing Homes / Clarify orders / etc []  - 0 Routine Transfer to another Facility (non-emergent condition) []  - 0 Routine Hospital Admission (non-emergent condition) []  - 0 New Admissions / Biomedical engineer / Ordering NPWT Apligraf, etc. , []  - 0 Emergency Hospital Admission (emergent condition) []  - 0 Simple Discharge Coordination X- 1 15 Complex (extensive) Discharge Coordination PROCESS - Special Needs []  - 0 Pediatric / Minor Patient Management []  - 0 Isolation Patient Management []  - 0 Hearing / Language / Visual special needs []  - 0 Assessment of Community assistance (transportation, D/C planning, etc.) []  - 0 Additional  assistance / Altered mentation []  - 0 Support Surface(s) Assessment (bed, cushion, seat, etc.) INTERVENTIONS - Wound Cleansing / Measurement []  - 0 Simple Wound Cleansing - one wound X- 3  5 Complex Wound Cleansing - multiple wounds X- 1 5 Wound Imaging (photographs - any number of wounds) []  - 0 Wound Tracing (instead of photographs) []  - 0 Simple Wound Measurement - one wound X- 3 5 Complex Wound Measurement - multiple wounds INTERVENTIONS - Wound Dressings X - Small Wound Dressing one or multiple wounds 2 10 X- 1 15 Medium Wound Dressing one or multiple wounds []  - 0 Large Wound Dressing one or multiple wounds []  - 0 Application of Medications - topical []  - 0 Application of Medications - injection INTERVENTIONS - Miscellaneous []  - 0 External ear exam []  - 0 Specimen Collection (cultures, biopsies, blood, body fluids, etc.) []  - 0 Specimen(s) / Culture(s) sent or taken to Lab for analysis []  - 0 Patient Transfer (multiple staff / Civil Service fast streamer / Similar devices) []  - 0 Simple Staple / Suture removal (25 or less) []  - 0 Complex Staple / Suture removal (26 or more) []  - 0 Hypo / Hyperglycemic Management (close monitor of Blood Glucose) []  - 0 Ankle / Brachial Index (ABI) - do not check if billed separately X- 1 5 Vital Signs Has the patient been seen at the hospital within the last three years: Yes Total Score: 190 Level Of Care: New/Established - Level 5 Electronic Signature(s) Signed: 06/27/2020 5:47:26 PM By: Deon Pilling Entered By: Deon Pilling on 06/27/2020 13:30:29 -------------------------------------------------------------------------------- Encounter Discharge Information Details Patient Name: Date of Service: [name] [name] 06/27/2020 12:30 PM Medical Record Number: [id number] Patient Account Number: [id number] Date of Birth/Sex: Treating RN: 1923-06-08 (87 y.o. [name] Primary Care [name]: Sherrie Mustache Other Clinician: Referring  [name]: Treating [name]/Extender: Jerral Bonito in Treatment: [phone] Encounter Discharge Information Items Discharge Condition: Stable Ambulatory Status: Wheelchair Discharge Destination: Home Transportation: Private Auto Accompanied By: Family/CG Schedule Follow-up Appointment: Yes Clinical Summary of Care: Provided on 06/27/2020 Form Type Recipient Paper Patient Patient Electronic Signature(s) Signed: 06/27/2020 1:30:37 PM By: Lorrin Jackson Entered By: Lorrin Jackson on 06/27/2020 13:30:36 -------------------------------------------------------------------------------- Lower Extremity Assessment Details Patient Name: Date of Service: [name] [name] 06/27/2020 12:30 PM Medical Record Number: [id number] Patient Account Number: [id number] Date of Birth/Sex: Treating RN: 12-23-1923 (87 y.o. [name], [name] Primary Care Aftyn Nott: Sherrie Mustache Other Clinician: Referring Bently Morath: Treating Samanth Mirkin/Extender: Jerral Bonito in Treatment: 48 Edema Assessment Assessed: [Left: No] Patrice Paradise: Yes] Edema: [Left: N] [Right: o] Calf Left: Right: Point of Measurement: From Medial Instep 26.3 cm 27 cm Ankle Left: Right: Point of Measurement: From Medial Instep 19 cm 20.8 cm Vascular Assessment Pulses: Dorsalis Pedis Palpable: [Left:Yes] [Right:Yes] Posterior Tibial Palpable: [Left:Yes] [Right:Yes] Electronic Signature(s) Signed: 06/27/2020 5:24:28 PM By: Rhae Hammock RN Entered By: Rhae Hammock on 06/27/2020 12:42:39 -------------------------------------------------------------------------------- [place] Details Patient Name: Date of Service: [name] [name] 06/27/2020 12:30 PM Medical Record Number: [id number] Patient Account Number: [id number] Date of Birth/Sex: Treating RN: 02-02-1924 (87 y.o. [name] Primary Care Kathi Dohn: Sherrie Mustache Other Clinician: Referring  Callyn Severtson: Treating Hazel Wrinkle/Extender: Jerral Bonito in Treatment: [place] reviewed with physician Active Inactive Wound/Skin Impairment Nursing Diagnoses: Knowledge deficit related to ulceration/compromised skin integrity Goals: Patient/caregiver will verbalize understanding of skin care regimen Date Initiated: 07/25/2019 Target Resolution Date: 09/06/2020 Goal Status: Active Ulcer/skin breakdown will have a volume reduction of 30% by week 4 Date Initiated: 07/25/2019 Date Inactivated: 08/28/2019 Target Resolution Date: 08/25/2019 Goal Status: Unmet Unmet Reason: Osteomyelitis Interventions: Assess patient/caregiver ability to obtain  necessary supplies Assess patient/caregiver ability to perform ulcer/skin care regimen upon admission and as needed Assess ulceration(s) every visit Notes: Electronic Signature(s) Signed: 06/27/2020 5:47:26 PM By: Deon Pilling Entered By: Deon Pilling on 06/27/2020 13:22:46 -------------------------------------------------------------------------------- Pain Assessment Details Patient Name: Date of Service: [name] 06/27/2020 12:30 PM Medical Record Number: [id number] Patient Account Number: [id number] Date of Birth/Sex: Treating RN: 1924-01-16 (87 y.o. [name] Primary Care Issiah Huffaker: Sherrie Mustache Other Clinician: Referring Cathy Ropp: Treating Alyviah Crandle/Extender: Jerral Bonito in Treatment: 48 Active Problems Location of Pain Severity and Description of Pain Patient Has Paino No Site Locations Rate the pain. Current Pain Level: 0 Pain Management and Medication Current Pain Management: Electronic Signature(s) Signed: 06/27/2020 5:24:28 PM By: Rhae Hammock RN Entered By: Rhae Hammock on 06/27/2020 12:41:00 -------------------------------------------------------------------------------- Patient/Caregiver Education Details Patient  Name: Date of Service: [name] [name] 3/10/2022andnbsp12:30 PM Medical Record Number: [id number] Patient Account Number: [id number] Date of Birth/Gender: Treating RN: August 15, 1923 (87 y.o. [name] Primary Care Physician: Sherrie Mustache Other Clinician: Referring Physician: Treating Physician/Extender: Jerral Bonito in Treatment: 35 Education Assessment Education Provided To: Caregiver daughter Education Topics Provided Wound/Skin Impairment: Handouts: Skin Care Do's and Dont's Methods: Explain/Verbal Responses: Reinforcements needed Electronic Signature(s) Signed: 06/27/2020 5:47:26 PM By: Deon Pilling Entered By: Deon Pilling on 06/27/2020 13:23:04 -------------------------------------------------------------------------------- Wound Assessment Details Patient Name: Date of Service: [name] 06/27/2020 12:30 PM Medical Record Number: [id number] Patient Account Number: [id number] Date of Birth/Sex: Treating RN: 1923/11/30 (87 y.o. [name], [name] Primary Care Preciosa Bundrick: Sherrie Mustache Other Clinician: Referring Linsay Vogt: Treating Merit Gadsby/Extender: Jerral Bonito in Treatment: 58 Wound Status Wound Number: 2 Primary Diabetic Wound/Ulcer of the Lower Extremity Etiology: Wound Location: Right T Great oe Wound Open Wounding Event: Gradually Appeared Status: Date Acquired: 06/15/2019 Comorbid Cataracts, Arrhythmia, Hypertension, Peripheral Arterial Disease, Weeks Of Treatment: 48 History: Type II Diabetes, Osteomyelitis, Dementia, Neuropathy, Received Clustered Wound: No Chemotherapy Wound Measurements Length: (cm) 4 Width: (cm) 8 Depth: (cm) 0.1 Area: (cm) 25.133 Volume: (cm) 2.513 % Reduction in Area: -26637.2% % Reduction in Volume: -8875% Epithelialization: None Tunneling: No Undermining: No Wound Description Classification: Grade 4 Wound Margin: Indistinct, nonvisible Exudate  Amount: None Present Foul Odor After Cleansing: No Slough/Fibrino No Wound Bed Granulation Amount: None Present (0%) Exposed Structure Necrotic Amount: Large (67-100%) Fascia Exposed: No Necrotic Quality: Eschar Fat Layer (Subcutaneous Tissue) Exposed: No Tendon Exposed: No Muscle Exposed: No Joint Exposed: No Bone Exposed: No Treatment Notes Wound #2 (Toe Great) Wound Laterality: Right Cleanser Soap and Water Discharge Instruction: May shower and wash wound with dial antibacterial soap and water prior to dressing change. Peri-Wound Care Topical Primary Dressing betadine Discharge Instruction: to toe daily Secondary Dressing Secured With Compression Wrap Compression Stockings Add-Ons Electronic Signature(s) Signed: 06/27/2020 5:24:28 PM By: Rhae Hammock RN Entered By: Rhae Hammock on 06/27/2020 12:48:00 -------------------------------------------------------------------------------- Wound Assessment Details Patient Name: Date of Service: [name] [name] 06/27/2020 12:30 PM Medical Record Number: [id number] Patient Account Number: [id number] Date of Birth/Sex: Treating RN: [birth year] (87 y.o. [name] Primary Care Tambria Pfannenstiel: Sherrie Mustache Other Clinician: Referring Raynah Gomes: Treating Vita Currin/Extender: Jerral Bonito in Treatment: 50 Wound Status Wound Number: 3 Primary Pressure Ulcer Etiology: Wound Location: Right Calcaneus Secondary Arterial Insufficiency Ulcer Wounding Event: Gradually Appeared Etiology: Date Acquired: 09/21/2019 Wound Open Weeks Of Treatment: 36 Status: Clustered Wound: No Comorbid Cataracts, Arrhythmia, Hypertension, Peripheral Arterial Disease, History: Type  II Diabetes, Osteomyelitis, Dementia, Neuropathy, Received Chemotherapy Wound Measurements Length: (cm) 0.1 Width: (cm) 0.1 Depth: (cm) 0.1 Area: (cm) 0.008 Volume: (cm) 0.001 % Reduction in Area: 100% % Reduction in Volume:  100% Epithelialization: Medium (34-66%) Tunneling: No Undermining: No Wound Description Classification: Unstageable/Unclassified Wound Margin: Flat and Intact Exudate Amount: Medium Exudate Type: Serosanguineous Exudate Color: red, brown Foul Odor After Cleansing: No Slough/Fibrino Yes Wound Bed Granulation Amount: Large (67-100%) Exposed Structure Granulation Quality: Pink Fascia Exposed: No Necrotic Amount: Small (1-33%) Fat Layer (Subcutaneous Tissue) Exposed: Yes Necrotic Quality: Adherent Slough Tendon Exposed: No Muscle Exposed: No Joint Exposed: No Bone Exposed: No Treatment Notes Wound #3 (Calcaneus) Wound Laterality: Right Cleanser Soap and Water Discharge Instruction: May shower and wash wound with dial antibacterial soap and water prior to dressing change. Peri-Wound Care Topical Primary Dressing betadine Discharge Instruction: apply betadine daily Secondary Dressing ALLEVYN Heel 4 1/2in x 5 1/2in / 10.5cm x 13.5cm Discharge Instruction: Apply over primary dressing as directed. Secured With [employer], 4.5x3.1 (in/yd) Discharge Instruction: Secure with Kerlix as directed. Paper Tape, 2x10 (in/yd) Discharge Instruction: Secure dressing with tape as directed. Compression Wrap Compression Stockings Add-Ons Electronic Signature(s) Signed: 06/27/2020 5:24:28 PM By: Rhae Hammock RN Entered By: Rhae Hammock on 06/27/2020 12:47:43 -------------------------------------------------------------------------------- Wound Assessment Details Patient Name: Date of Service: [name] [name] 06/27/2020 12:30 PM Medical Record Number: [id number] Patient Account Number: [id number] Date of Birth/Sex: Treating RN: 28-Apr-1923 (87 y.o. [name], [name] Primary Care Vineta Carone: Sherrie Mustache Other Clinician: Referring Broady Lafoy: Treating Naeema Patlan/Extender: Jerral Bonito in Treatment: 64 Wound Status Wound Number: 6 Primary  Pressure Ulcer Etiology: Wound Location: Left T Fifth oe Secondary Diabetic Wound/Ulcer of the Lower Extremity Wounding Event: Pressure Injury Etiology: Date Acquired: 05/01/2020 Wound Open Weeks Of Treatment: 8 Status: Clustered Wound: No Comorbid Cataracts, Arrhythmia, Hypertension, Peripheral Arterial Disease, History: Type II Diabetes, Osteomyelitis, Dementia, Neuropathy, Received Chemotherapy Wound Measurements Length: (cm) 0.1 Width: (cm) 0.1 Depth: (cm) 0.1 Area: (cm) 0.008 Volume: (cm) 0.001 % Reduction in Area: 88.7% % Reduction in Volume: 85.7% Epithelialization: Large (67-100%) Tunneling: No Undermining: No Wound Description Classification: Category/Stage II Wound Margin: Distinct, outline attached Exudate Amount: Small Exudate Type: Serosanguineous Exudate Color: red, brown Foul Odor After Cleansing: No Slough/Fibrino Yes Wound Bed Granulation Amount: Large (67-100%) Exposed Structure Granulation Quality: Pink Fascia Exposed: No Necrotic Amount: None Present (0%) Fat Layer (Subcutaneous Tissue) Exposed: Yes Tendon Exposed: No Muscle Exposed: No Joint Exposed: No Bone Exposed: No Treatment Notes Wound #6 (Toe Fifth) Wound Laterality: Left Cleanser Peri-Wound Care Topical Primary Dressing Maxorb Extra Calcium Alginate 2x2 in Discharge Instruction: Apply calcium alginate to wound bed as instructed Secondary Dressing Woven Gauze Sponges 2x2 in Discharge Instruction: Apply over primary dressing as directed. Secured With Conforming Stretch Gauze Bandage, Sterile 2x75 (in/in) Discharge Instruction: Ensure to take ensure the toe is does not bend. .Secure with stretch gauze as directed. Compression Wrap Compression Stockings Add-Ons Electronic Signature(s) Signed: 06/27/2020 5:24:28 PM By: Rhae Hammock RN Entered By: Rhae Hammock on 06/27/2020 12:43:28 -------------------------------------------------------------------------------- Vitals  Details Patient Name: Date of Service: [name] [name] 06/27/2020 12:30 PM Medical Record Number: [id number] Patient Account Number: [id number] Date of Birth/Sex: Treating RN: 04-Dec-1923 (87 y.o. [name] Primary Care Vadim Centola: Sherrie Mustache Other Clinician: Referring Mikaella Escalona: Treating Aldred Mase/Extender: Jerral Bonito in Treatment: 48 Vital Signs Time Taken: 12:39 Temperature (F): 97.6 Height (in): 70 Pulse (bpm): 80 Weight (lbs): 140 Respiratory Rate (breaths/min): 17  Body Mass Index (BMI): 20.1 Blood Pressure (mmHg): 139/80 Reference Range: 80 - 120 mg / dl Electronic Signature(s) Signed: 06/27/2020 5:24:28 PM By: Rhae Hammock RN Entered By: Rhae Hammock on 06/27/2020 12:40:00

## 2020-06-27 NOTE — Progress Notes (Addendum)
AGRON, SWINEY ([id number]) Visit Report for 06/27/2020 Chief Complaint Document Details Patient Name: Date of Service: [name] 06/27/2020 12:30 PM Medical Record Number: [id number] Patient Account Number: [id number] Date of Birth/Sex: Treating RN: [birth year] (87 y.o. [name] Primary Care Provider: Sherrie Mustache Other Clinician: Referring Provider: Treating Provider/Extender: Jerral Bonito in Treatment: 98 Information Obtained from: Patient Chief Complaint 05/02/2019; patient is here for review of the wound on the tip of the right great toe right at the tip of his toenail 07/25/2019; patient is back for review of the wound on the tip of his right great toe Electronic Signature(s) Signed: 06/27/2020 1:09:44 PM By: Worthy Keeler PA-C Entered By: Worthy Keeler on 06/27/2020 13:09:44 -------------------------------------------------------------------------------- HPI Details Patient Name: Date of Service: [name] [name] 06/27/2020 12:30 PM Medical Record Number: [id number] Patient Account Number: [id number] Date of Birth/Sex: Treating RN: 1923/08/26 (87 y.o. [name] Primary Care Provider: Sherrie Mustache Other Clinician: Referring Provider: Treating Provider/Extender: Jerral Bonito in Treatment: 59 History of Present Illness HPI Description: ADMISSION 05/02/2019 This is a 87 year old man accompanied by his daughter. He lives in [place] with his elderly wife. He has home caregivers as well. Apparently on February 28, 2019 they noted blood on his sock and noticed a wound on the tip of his right great toe. He has been cared for by Dr. Posey Pronto who is his podiatrist in [place]. Apparently they have been using Betadine to this area. The patient has a thick mycotic nail on the right as well which is embedded in the wound in some areas. Fortunately the patient has diabetic neuropathy and is  not really experiencing any pain otherwise I think this would all be quite painful. The patient was previously treated in 2019 for wounds on the left foot. This included a fairly thorough vascular review by vein and vascular Dr. Oneida Alar. His ABIs at that time were noncompressible bilaterally TBI on the right was 0.88 with biphasic waveforms on the right and monophasic waveforms at the left. He ended up having amputations of the left first and second toes. He did have an angiogram on 06/25/2017 which showed on the right the right common femoral profundofemoral for Morris and superficial femoral arteries were widely patent the right popliteal was widely patent. Tibial vessels were not well opacified secondary to motion artifact however he was felt to have all 3 tibial vessels intact with some calcification and some areas of stenosis within the posterior tibial artery but relatively good flow to the right lower leg. He also had the left reviewed as well which was the source of the problem at that time. Past medical history; prostate cancer on oral antiandrogen therapy with bone mets, GI stromal tumor in 2005, type 2 diabetes with peripheral neuropathy, hypertension, atrial fibrillation on Eliquis, history of non-Hodgkin's lymphoma treated with CHOP in 2005. He had amputations of the left first and second toes secondary to osteomyelitis. Does not appear that he has had an x-ray of the right foot ABI in our clinic on the right was noncompressible [see full arterial discussion above]. 1/19; area on the tip of the right great toe under a very mycotic nail tip. We use silver alginate. 1/26; area of the tip of the right great toe under a very mycotic nail. A lot of this seems to have closed down we have been using silver alginate 2/9; the areas on the tip of the  right great toe under a very mycotic nail. A lot of this continues to close down. I do not think there is a subungual problem. I am hopeful that this  mycotic nail which is thick and raised will allow full epithelialization of the wound 2/23; this was a difficult area on the tip of his right great toe. Thick mycotic nail over the base of the wound. This appears to have closed down now. The nail itself is thick split with considerable subungual debris. READMISSION 07/25/2019 This is a 87 year old man that we cared for earlier this year in the clinic without a wound on the tip of his right great toe under a very mycotic toenail. He is a type II diabetic. His ABIs have been noncompressible however he has been felt to have adequate blood flow in the right to heal the wound. We were able to get this wound to close over as I understand things he followed with Dr. Caprice Beaver had some debridement of the nail everything looks fine and apparently the wound bed was washed off by his wife over the weekend and by Monday it was open. His daughter is able to show me pictures. Unfortunately this now has exposed bone raising the possibility that he has had underlying osteomyelitis all along. He has been using Silvadene cream on this he has been referred back to the clinic by Dr. Caprice Beaver. He had x-rays of the right foot that did not show evidence of osteomyelitis in the right foot. He has not been on antibiotics He also had an x-ray of the left foot that showed findings suspicious for osteomyelitis involving the residual base of the first proximal phalanx and the proximal phalanx of the left second toe as well as the left second metatarsal head however he has no wounds in this area. 4/12; culture from last time of bone in the right great toe was negative. The patient's wife apparently scrubbed this toe vigorously and according to his daughter reopen the wound however unfortunately it is all exposed bone last time. I removed some of this and we use Prisma unfortunately the area does not look much better today. Previous x-rays done in podiatry office Dr. Caprice Beaver did not  show evidence of osteomyelitis. Nevertheless I think osteomyelitis here is probably quite likely. Still using Prisma 4/26; he took 2 weeks of doxycycline although his daughter is telling me he is not eating. He has loose bowel movements however I did not get the sense that this is changed all that much. He still has exposed bone. Culture I did last time showed a few coag negative staph I do not think that this is necessarily relevant. I still am operating under the premise that the patient probably has osteomyelitis. After considerable difficulty we are able to determine he is not allergic to cephalosporins I therefore gave him Keflex. 5/10; is tolerating Keflex 500 3 times daily quite well. He still has the small wound in the tip of his toe with exposed bone.Marland Kitchen Unfortunately he does not really have any granulation tissue. He saw Dr. Caprice Beaver of podiatry in [place] last week I have not had a chance to look at his notes The patient had an extensive vascular work-up including an angiogram in 2019. He was felt to have adequate blood supply on the right at the time although he did have amputations of the left first and second toe by Dr. Oneida Alar. 09/11/19 -Patient is back and is about to finish his Keflex, appointment with Dr. Oneida Alar is  in the works. The right great toe wound looks about the same, the left third toe dorsal wound has healed. There was a small eschar covering it that was removed by podiatry.According to patient's daughter the right great toe base on the plantar surface had some redness that seems to have disappeared. 10/13/2019; patient is back in clinic today after 1 month hiatus although I have not seen him since 5/10. Since he was last here he was admitted to hospital from 6/2 through 6/5 because of a nonhealing wound on his right great toe. He underwent an angiogram which showed patent common femoral, profunda, SFA above and below-knee popliteal artery. The dominant runoff in the right  lower extremity was through the anterior tibial but that became very small at the level of the ankle and had minimal outflow into the foot. The peroneal artery was patent but atretic. 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 Interventions were attempted on the posterior tibial. Mechanical orbital atherectomy down to the level of the ankle and then this was angioplastied with 2.3 mm and 3 mm balloons. Flow remained very sluggish down the posterior tibial artery but was patent. Post event there apparently was embolization of an area involving the plantar left heel and sides of the left heel. He arrives back in clinic today it has been almost 7 weeks since I have seen him. Miraculously the great toe area with open bone is closed over probably as result of Dr. Ainsley Spinner interventions. He does however have an extensive ischemic area on the plantar and medial aspect of his heel. I reviewed Dr. Ainsley Spinner last note from 3 days ago on 6/22. He did not think there was enough inflow to heal the heel wound. With eschar and sloughing. Wondered whether he could heal a below-knee amputation but more likely an above the knee amputation when required. He was placed on Xeroform to the right heel In discussing things with the patient's family and the patient present he is not in a lot of pain. The area does not appear to be grossly infected. The patient appears to be comfortable and conversational. He is eating and drinking fairly well. 7/9; the patient saw Dr. Carlis Abbott on 6/29. He had previously done heroic attempt to establish blood flow as described above. 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 Although the eschar on the heel looks ominous both plantar and medial it is certainly no worse  than last week. We will continue Betadine to this area with alginate. Ultimately this will separate off and we will see about the viability of the tissue underneath. Also in clinic today our nurses were washing his right great toe tip apparently some of the skin came off eschared and there is an open wound at the tip of the great toe with exposed bone similar to what he had previously 7/23; he has the open area on the tip of his first toe which I think is mostly bone. Then he has the embolic area on the plantar heel extending into the medial heel. We have been using Betadine and silver alginate to the heel silver alginate to the toe. His daughter brings up that she was given nitroglycerin patches by Dr. Donzetta Matters to try and open blood flow to the foot. She has not been using them out  of concern of systemic hypotension and also that the labels said not to put them on extremities 8/10-Returns at 3 weeks, open area on the tip of his first toe that is dried out, the heel with eschar, applying Betadine to the eschar area and silver alginate dressing. 9/7; 1 month follow-up. He has an open area on the tip of the toe almost subungual with a thick mycotic nail. The heel wound has black eschar but this is beginning to separate. We have been using Betadine and silver alginate on the heel and simply silver alginate on the right great toe 9/23; the patient was worked in urgently at the request of his wife was concerned about infection in the tip of the toe. Apparently this was brought up by home health. 10/14; the patient has the area of exposed bone on the tip of the right great toe. He also has the eschared area on the plantar heel which was a embolic phenomenon at the time of attempted revascularization. He also had an area of denuded epithelium over both buttocks which I think is probably a combination of pressure friction and moisture. They have been using zinc oxide which is appropriate 10/26. The patient has  an area of wider exposed bone at the tip of the right great toe. The substantial eschared area on the heel was already 50% separated I remove this. Actually the patient's wife had called repetitively for urgent appointments with some degree of erythema in the foot as the primary concern. It turns out that she has been to see her primary doctor after the patient had a fall off the commode resulting in skin tears to both forearms. He was placed on doxycycline because of concern of cellulitis of the toe. She is also been to see [facility] in [place] with regards to the toe. Apparently he shaved the bone and recommended Betadine to this. Noteworthy that the patient already had an extensive arterial evaluation with an angiogram. He has known PAD with tibial vessel disease. The last effort was an attempt to open up the posterior tibial artery which resulted in symptoms of embolization to the heel. 11/9; since the patient was last here he was brought to the ER on 11/4. He was felt to have cellulitis in the right great toe. They did not do a wound culture however they did blood cultures which showed 3 out of 3 Staph hominis. He has been treated with clindamycin 150 mg 3 times times a day. His daughter says he went to the hospital with altered LOC. He had been running a fever but was not febrile on arrival to the ER. Looking over the emergency room notes he had erythema in his foot and I think he had cellulitis. He had apparently been on doxycycline 100 mg twice daily prescribed by Dr. Caprice Beaver prior to this. The staph isolate itself is very resistant it is sensitive to vancomycin but resistant to methicillin's, quinolones, tetracycline and Septra. During the stay in the ER she was seen by vein and vascular they noted that he does not have any further options for revascularization. They do not think that he could undergo any foot conserving surgery therefore the only thing left here would be a  BKA The area of the heel that I debrided last time he is here actually looks quite healthy nice healthy looking granulation tissue over perhaps 60 to 70% of the wound still 30% slough we have been using Iodoflex. They have been using silver collagen to  the toe The other issue here is the daughter is struggling with the idea of in-home hospice. I went over this with her. I emphasized that hospice is not a home care agency as a velocity of care at the end of life. Patient is 87 years old he apparently is eating and drinking less and since I have known him he has become more frail 03/11/2020 upon evaluation today patient unfortunately is showing signs of progression in regard to his right great toe. Currently he is actually experiencing increased evidence of skin deterioration and in fact this toe is developing into more of a dry gangrene issue at this point. He was signed up for hospice on Thursday and then apparently hospice was called off on Friday due to the fact that they were talking about doing wet-to-dry dressings to both wound locations and his daughter was really concerned about this. She did not want anything to make the wounds progress faster than they should be. With that being said I think a wet-to-dry dressing would be okay for the heel location. I do not believe this would cause any harm. With that being said and in regard to the toe I really do agree I would not really want a wet-to-dry dressing here I think more of like Betadine and dry gauze dressing could be a appropriate way to go after the toe was painted with Betadine that is. 12/9. It has been a month since I saw this man. He was using Betadine to the great toe and Iodoflex to the area on the heel. The heel actually look quite good last time I saw this this is gotten larger and not nearly as healthy looking as last time. Moreover the right first toe wound now has dry gangrene Since they were last here they have not elected to sign  up for hospice. They do not have home health. They are trying to get some home care through the [facility] 1/13; the patient's right first toe is mummified to the inter phalangeal joint. The area on the plantar aspect of his right heel actually measures quite a bit better. We have been using Iodoflex His daughter points out an area on the plantar aspect of the left 5th toe at roughly the level of the proximal inner phalangeal 2/10; right first toe remains mummified there is no change in this no evidence of infection no erythema spreading proximally into the foot He still has the area on the plantar aspect of his right heel. This requires a debridement. We have been using silver alginate He had an area last time at the base of his left fifth toe. This is healed however he has a new area on the medial part of the left fifth toe. This also required debridement. We have been using silver alginate largely to all wound areas 06/27/2020 upon evaluation today patient continues to hang onto the great toe. This does seem to be fairly stable and dry although the patient's daughter notes there has been a little bit of drainage here. Fortunately there is no signs of active infection at this time. No fevers, chills, nausea, vomiting, or diarrhea. Overall very pleased with the fact that the patient seems to be hanging on and doing quite well. The heel is healing quite nicely. With that being said I think the toe eventually will self amputate. Electronic Signature(s) Signed: 06/27/2020 1:38:25 PM By: Worthy Keeler PA-C Entered By: Worthy Keeler on 06/27/2020 13:38:25 -------------------------------------------------------------------------------- Physical Exam Details Patient Name: Date  of Service: [name] 06/27/2020 12:30 PM Medical Record Number: [id number] Patient Account Number: [id number] Date of Birth/Sex: Treating RN: 1923/09/05 (87 y.o. [name] Primary Care Provider: Sherrie Mustache Other  Clinician: Referring Provider: Treating Provider/Extender: Jerral Bonito in Treatment: 56 Constitutional Well-nourished and well-hydrated in no acute distress. Respiratory normal breathing without difficulty. Psychiatric Patient is not able to cooperate in decision making regarding care. Patient has dementia. pleasant and cooperative. Notes Patient's wounds currently appear to be doing decently well which is great news. Regard to the heel which actually may be healed although I could not get the dry eschar off of this completely I did not want to cause any damage attempting to do so too aggressively. With that being said mainly he just has a small ulcer on the left fifth toe which is also quite good as far as the overall appearance is concerned and then obviously the right great toe which is gangrenous and honestly I think proceeding to self amputation. Electronic Signature(s) Signed: 06/27/2020 5:26:16 PM By: Worthy Keeler PA-C Entered By: Worthy Keeler on 06/27/2020 17:26:16 -------------------------------------------------------------------------------- Physician Orders Details Patient Name: Date of Service: [name] [name] 06/27/2020 12:30 PM Medical Record Number: [id number] Patient Account Number: [id number] Date of Birth/Sex: Treating RN: 1923/06/21 (87 y.o. [name] Primary Care Provider: Sherrie Mustache Other Clinician: Referring Provider: Treating Provider/Extender: Jerral Bonito in Treatment: 17 Verbal / Phone Orders: No Diagnosis Coding ICD-10 Coding Code Description E11.621 Type 2 diabetes mellitus with foot ulcer L97.514 Non-pressure chronic ulcer of other part of right foot with necrosis of bone E11.40 Type 2 diabetes mellitus with diabetic neuropathy, unspecified L97.412 Non-pressure chronic ulcer of right heel and midfoot with fat layer exposed L97.521 Non-pressure chronic ulcer of other part of left  foot limited to breakdown of skin Follow-up Appointments Return appointment in 1 month. Bathing/ Shower/ Hygiene May shower and wash wound with soap and water. - with dressing changes Edema Control - Lymphedema / SCD / Other Elevate legs to the level of the heart or above for 30 minutes daily and/or when sitting, a frequency of: Avoid standing for long periods of time. Moisturize legs daily. Additional Orders / Instructions Other: - Apply Desitin Zinc oxide redness, excoriated areas to buttock area daily and as needed for protection. Home Health New wound care orders this week; continue Home Health for wound care. May utilize formulary equivalent dressing for wound treatment orders unless otherwise specified. - Advance home health to monitor weekly of right great toe. Wound Treatment Wound #2 - T Great oe Wound Laterality: Right Cleanser: Soap and Water 1 x Per Day/30 Days Discharge Instructions: May shower and wash wound with dial antibacterial soap and water prior to dressing change. Prim Dressing: betadine 1 x Per Day/30 Days ary Discharge Instructions: to toe daily Wound #3 - Calcaneus Wound Laterality: Right Cleanser: Soap and Water 1 x Per Day/30 Days Discharge Instructions: May shower and wash wound with dial antibacterial soap and water prior to dressing change. Prim Dressing: betadine 1 x Per Day/30 Days ary Discharge Instructions: apply betadine daily Secondary Dressing: ALLEVYN Heel 4 1/2in x 5 1/2in / 10.5cm x 13.5cm 1 x Per Day/30 Days Discharge Instructions: Apply over primary dressing as directed. Secured With: [employer], 4.5x3.1 (in/yd) (Generic) 1 x Per Day/30 Days Discharge Instructions: Secure with Kerlix as directed. Secured With: Paper Tape, 2x10 (in/yd) (Generic) 1 x Per Day/30 Days Discharge  Instructions: Secure dressing with tape as directed. Wound #6 - T Fifth oe Wound Laterality: Left Prim Dressing: Maxorb Extra Calcium Alginate 2x2 in Every  Other Day/30 Days ary Discharge Instructions: Apply calcium alginate to wound bed as instructed Secondary Dressing: Woven Gauze Sponges 2x2 in Every Other Day/30 Days Discharge Instructions: Apply over primary dressing as directed. Secured With: Child psychotherapist, Sterile 2x75 (in/in) Every Other Day/30 Days Discharge Instructions: Ensure to take ensure the toe is does not bend. .Secure with stretch gauze as directed. Electronic Signature(s) Signed: 06/27/2020 5:28:00 PM By: Worthy Keeler PA-C Signed: 06/27/2020 5:47:26 PM By: Deon Pilling Entered By: Deon Pilling on 06/27/2020 13:29:35 -------------------------------------------------------------------------------- Problem List Details Patient Name: Date of Service: [name] [name] 06/27/2020 12:30 PM Medical Record Number: [id number] Patient Account Number: [id number] Date of Birth/Sex: Treating RN: 1924-02-07 (87 y.o. 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 layer exposed 02/13/2020 No Yes L97.521 Non-pressure chronic ulcer of other part of left foot limited to breakdown of 05/02/2020 No Yes skin Inactive Problems ICD-10 Code Description Active Date Inactive Date S40.812D Abrasion of left upper arm, subsequent encounter 02/13/2020 02/13/2020 S40.811D Abrasion of right upper arm, subsequent encounter 02/13/2020 02/13/2020 Resolved Problems Electronic Signature(s) Signed: 06/27/2020  1:09:38 PM By: Worthy Keeler PA-C Entered By: Worthy Keeler on 06/27/2020 13:09:38 -------------------------------------------------------------------------------- Progress Note Details Patient Name: Date of Service: [name] [name] 06/27/2020 12:30 PM Medical Record Number: [id number] Patient Account Number: [id number] Date of Birth/Sex: Treating RN: [birth year] (87 y.o. [name] Primary Care Provider: Sherrie Mustache Other Clinician: Referring Provider: Treating Provider/Extender: Jerral Bonito in Treatment: 22 Subjective Chief Complaint Information obtained from Patient 05/02/2019; patient is here for review of the wound on the tip of the right great toe right at the tip of his toenail 07/25/2019; patient is back for review of the wound on the tip of his right great toe History of Present Illness (HPI) ADMISSION 05/02/2019 This is a 87 year old man accompanied by his daughter. He lives in [place] with his elderly wife. He has home caregivers as well. Apparently on February 28, 2019 they noted blood on his sock and noticed a wound on the tip of his right great toe. He has been cared for by Dr. Posey Pronto who is his podiatrist in [place]. Apparently they have been using Betadine to this area. The patient has a thick mycotic nail on the right as well which is embedded in the wound in some areas. Fortunately the patient has diabetic neuropathy and is not really experiencing any pain otherwise I think this would all be quite painful. The patient was previously treated in 2019 for wounds on the left foot. This included a fairly thorough vascular review by vein and vascular Dr. Oneida Alar. His ABIs at that time were noncompressible bilaterally TBI on the right was 0.88 with biphasic waveforms on the right and monophasic waveforms at the left. He ended up having amputations of the left first and second toes. He did have an angiogram  on 06/25/2017  which showed on the right the right common femoral profundofemoral for Morris and superficial femoral arteries were widely patent the right popliteal was widely patent. Tibial vessels were not well opacified secondary to motion artifact however he was felt to have all 3 tibial vessels intact with some calcification and some areas of stenosis within the posterior tibial artery but relatively good flow to the right lower leg. He also had the left reviewed as well which was the source of the problem at that time. Past medical history; prostate cancer on oral antiandrogen therapy with bone mets, GI stromal tumor in 2005, type 2 diabetes with peripheral neuropathy, hypertension, atrial fibrillation on Eliquis, history of non-Hodgkin's lymphoma treated with CHOP in 2005. He had amputations of the left first and second toes secondary to osteomyelitis. Does not appear that he has had an x-ray of the right foot ABI in our clinic on the right was noncompressible [see full arterial discussion above]. 1/19; area on the tip of the right great toe under a very mycotic nail tip. We use silver alginate. 1/26; area of the tip of the right great toe under a very mycotic nail. A lot of this seems to have closed down we have been using silver alginate 2/9; the areas on the tip of the right great toe under a very mycotic nail. A lot of this continues to close down. I do not think there is a subungual problem. I am hopeful that this mycotic nail which is thick and raised will allow full epithelialization of the wound 2/23; this was a difficult area on the tip of his right great toe. Thick mycotic nail over the base of the wound. This appears to have closed down now. The nail itself is thick split with considerable subungual debris. READMISSION 07/25/2019 This is a 87 year old man that we cared for earlier this year in the clinic without a wound on the tip of his right great toe under a very mycotic toenail. He is a type II  diabetic. His ABIs have been noncompressible however he has been felt to have adequate blood flow in the right to heal the wound. We were able to get this wound to close over as I understand things he followed with Dr. Caprice Beaver had some debridement of the nail everything looks fine and apparently the wound bed was washed off by his wife over the weekend and by Monday it was open. His daughter is able to show me pictures. Unfortunately this now has exposed bone raising the possibility that he has had underlying osteomyelitis all along. He has been using Silvadene cream on this he has been referred back to the clinic by Dr. Caprice Beaver. He had x-rays of the right foot that did not show evidence of osteomyelitis in the right foot. He has not been on antibiotics He also had an x-ray of the left foot that showed findings suspicious for osteomyelitis involving the residual base of the first proximal phalanx and the proximal phalanx of the left second toe as well as the left second metatarsal head however he has no wounds in this area. 4/12; culture from last time of bone in the right great toe was negative. The patient's wife apparently scrubbed this toe vigorously and according to his daughter reopen the wound however unfortunately it is all exposed bone last time. I removed some of this and we use Prisma unfortunately the area does not look much better today. Previous x-rays done in podiatry office Dr. Caprice Beaver  did not show evidence of osteomyelitis. Nevertheless I think osteomyelitis here is probably quite likely. Still using Prisma 4/26; he took 2 weeks of doxycycline although his daughter is telling me he is not eating. He has loose bowel movements however I did not get the sense that this is changed all that much. He still has exposed bone. Culture I did last time showed a few coag negative staph I do not think that this is necessarily relevant. I still am operating under the premise that the patient  probably has osteomyelitis. After considerable difficulty we are able to determine he is not allergic to cephalosporins I therefore gave him Keflex. 5/10; is tolerating Keflex 500 3 times daily quite well. He still has the small wound in the tip of his toe with exposed bone.Marland Kitchen Unfortunately he does not really have any granulation tissue. He saw Dr. Caprice Beaver of podiatry in [place] last week I have not had a chance to look at his notes The patient had an extensive vascular work-up including an angiogram in 2019. He was felt to have adequate blood supply on the right at the time although he did have amputations of the left first and second toe by Dr. Oneida Alar. 09/11/19 -Patient is back and is about to finish his Keflex, appointment with Dr. Oneida Alar is in the works. The right great toe wound looks about the same, the left third toe dorsal wound has healed. There was a small eschar covering it that was removed by podiatry.According to patient's daughter the right great toe base on the plantar surface had some redness that seems to have disappeared. 10/13/2019; patient is back in clinic today after 1 month hiatus although I have not seen him since 5/10. Since he was last here he was admitted to hospital from 6/2 through 6/5 because of a nonhealing wound on his right great toe. He underwent an angiogram which showed patent common femoral, profunda, SFA above and below-knee popliteal artery. The dominant runoff in the right lower extremity was through the anterior tibial but that became very small at the level of the ankle and had minimal outflow into the foot. The peroneal artery was patent but atretic. 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 Interventions were attempted on the posterior tibial. Mechanical orbital atherectomy down to the level of the ankle and then this was  angioplastied with 2.3 mm and 3 mm balloons. Flow remained very sluggish down the posterior tibial artery but was patent. Post event there apparently was embolization of an area involving the plantar left heel and sides of the left heel. He arrives back in clinic today it has been almost 7 weeks since I have seen him. Miraculously the great toe area with open bone is closed over probably as result of Dr. Ainsley Spinner interventions. He does however have an extensive ischemic area on the plantar and medial aspect of his heel. I reviewed Dr. Ainsley Spinner last note from 3 days ago on 6/22. He did not think there was enough inflow to heal the heel wound. With eschar and sloughing. Wondered whether he could heal a below-knee amputation but more likely an above the knee amputation when required. He was placed on Xeroform to the right heel In discussing things with the patient's family and the patient present he is not in a lot of pain. The area does not appear to be grossly infected. The  patient appears to be comfortable and conversational. He is eating and drinking fairly well. 7/9; the patient saw Dr. Carlis Abbott on 6/29. He had previously done heroic attempt to establish blood flow as described above. 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 Although the eschar on the heel looks ominous both plantar and medial it is certainly no worse than last week. We will continue Betadine to this area with alginate. Ultimately this will separate off and we will see about the viability of the tissue underneath. Also in clinic today our nurses were washing his right great toe tip apparently some of the skin came off eschared and there is an open wound at the tip of the great toe with exposed bone similar to what he had previously 7/23; he has the open area on the tip of his first toe which I think is mostly bone. Then he has the  embolic area on the plantar heel extending into the medial heel. We have been using Betadine and silver alginate to the heel silver alginate to the toe. His daughter brings up that she was given nitroglycerin patches by Dr. Donzetta Matters to try and open blood flow to the foot. She has not been using them out of concern of systemic hypotension and also that the labels said not to put them on extremities 8/10-Returns at 3 weeks, open area on the tip of his first toe that is dried out, the heel with eschar, applying Betadine to the eschar area and silver alginate dressing. 9/7; 1 month follow-up. He has an open area on the tip of the toe almost subungual with a thick mycotic nail. The heel wound has black eschar but this is beginning to separate. We have been using Betadine and silver alginate on the heel and simply silver alginate on the right great toe 9/23; the patient was worked in urgently at the request of his wife was concerned about infection in the tip of the toe. Apparently this was brought up by home health. 10/14; the patient has the area of exposed bone on the tip of the right great toe. He also has the eschared area on the plantar heel which was a embolic phenomenon at the time of attempted revascularization. He also had an area of denuded epithelium over both buttocks which I think is probably a combination of pressure friction and moisture. They have been using zinc oxide which is appropriate 10/26. The patient has an area of wider exposed bone at the tip of the right great toe. The substantial eschared area on the heel was already 50% separated I remove this. Actually the patient's wife had called repetitively for urgent appointments with some degree of erythema in the foot as the primary concern. It turns out that she has been to see her primary doctor after the patient had a fall off the commode resulting in skin tears to both forearms. He was placed on doxycycline because of concern of  cellulitis of the toe. She is also been to see [facility] in [place] with regards to the toe. Apparently he shaved the bone and recommended Betadine to this. Noteworthy that the patient already had an extensive arterial evaluation with an angiogram. He has known PAD with tibial vessel disease. The last effort was an attempt to open up the posterior tibial artery which resulted in symptoms of embolization to the heel. 11/9; since the patient was last here he was  brought to the ER on 11/4. He was felt to have cellulitis in the right great toe. They did not do a wound culture however they did blood cultures which showed 3 out of 3 Staph hominis. He has been treated with clindamycin 150 mg 3 times times a day. His daughter says he went to the hospital with altered LOC. He had been running a fever but was not febrile on arrival to the ER. Looking over the emergency room notes he had erythema in his foot and I think he had cellulitis. He had apparently been on doxycycline 100 mg twice daily prescribed by Dr. Caprice Beaver prior to this. The staph isolate itself is very resistant it is sensitive to vancomycin but resistant to methicillin's, quinolones, tetracycline and Septra. During the stay in the ER she was seen by vein and vascular they noted that he does not have any further options for revascularization. They do not think that he could undergo any foot conserving surgery therefore the only thing left here would be a BKA The area of the heel that I debrided last time he is here actually looks quite healthy nice healthy looking granulation tissue over perhaps 60 to 70% of the wound still 30% slough we have been using Iodoflex. They have been using silver collagen to the toe The other issue here is the daughter is struggling with the idea of in-home hospice. I went over this with her. I emphasized that hospice is not a home care agency as a velocity of care at the end of life. Patient is 87 years  old he apparently is eating and drinking less and since I have known him he has become more frail 03/11/2020 upon evaluation today patient unfortunately is showing signs of progression in regard to his right great toe. Currently he is actually experiencing increased evidence of skin deterioration and in fact this toe is developing into more of a dry gangrene issue at this point. He was signed up for hospice on Thursday and then apparently hospice was called off on Friday due to the fact that they were talking about doing wet-to-dry dressings to both wound locations and his daughter was really concerned about this. She did not want anything to make the wounds progress faster than they should be. With that being said I think a wet-to-dry dressing would be okay for the heel location. I do not believe this would cause any harm. With that being said and in regard to the toe I really do agree I would not really want a wet-to-dry dressing here I think more of like Betadine and dry gauze dressing could be a appropriate way to go after the toe was painted with Betadine that is. 12/9. It has been a month since I saw this man. He was using Betadine to the great toe and Iodoflex to the area on the heel. The heel actually look quite good last time I saw this this is gotten larger and not nearly as healthy looking as last time. Moreover the right first toe wound now has dry gangrene Since they were last here they have not elected to sign up for hospice. They do not have home health. They are trying to get some home care through the [facility] 1/13; the patient's right first toe is mummified to the inter phalangeal joint. The area on the plantar aspect of his right heel actually measures quite a bit better. We have been using Iodoflex His daughter points out an area on the plantar  aspect of the left 5th toe at roughly the level of the proximal inner phalangeal 2/10; right first toe remains mummified there is no change in  this no evidence of infection no erythema spreading proximally into the foot ooHe still has the area on the plantar aspect of his right heel. This requires a debridement. We have been using silver alginate ooHe had an area last time at the base of his left fifth toe. This is healed however he has a new area on the medial part of the left fifth toe. This also required debridement. We have been using silver alginate largely to all wound areas 06/27/2020 upon evaluation today patient continues to hang onto the great toe. This does seem to be fairly stable and dry although the patient's daughter notes there has been a little bit of drainage here. Fortunately there is no signs of active infection at this time. No fevers, chills, nausea, vomiting, or diarrhea. Overall very pleased with the fact that the patient seems to be hanging on and doing quite well. The heel is healing quite nicely. With that being said I think the toe eventually will self amputate. Objective Constitutional Well-nourished and well-hydrated in no acute distress. Vitals Time Taken: 12:39 PM, Height: 70 in, Weight: 140 lbs, BMI: 20.1, Temperature: 97.6 F, Pulse: 80 bpm, Respiratory Rate: 17 breaths/min, Blood Pressure: 139/80 mmHg. Respiratory normal breathing without difficulty. Psychiatric Patient is not able to cooperate in decision making regarding care. Patient has dementia. pleasant and cooperative. General Notes: Patient's wounds currently appear to be doing decently well which is great news. Regard to the heel which actually may be healed although I could not get the dry eschar off of this completely I did not want to cause any damage attempting to do so too aggressively. With that being said mainly he just has a small ulcer on the left fifth toe which is also quite good as far as the overall appearance is concerned and then obviously the right great toe which is gangrenous and honestly I think proceeding to self  amputation. Integumentary (Hair, Skin) Wound #2 status is Open. Original cause of wound was Gradually Appeared. The date acquired was: 06/15/2019. The wound has been in treatment 48 weeks. The wound is located on the Right T Great. The wound measures 4cm length x 8cm width x 0.1cm depth; 25.133cm^2 area and 2.513cm^3 volume. There is oe no tunneling or undermining noted. There is a none present amount of drainage noted. The wound margin is indistinct and nonvisible. There is no granulation within the wound bed. There is a large (67-100%) amount of necrotic tissue within the wound bed including Eschar. Wound #3 status is Open. Original cause of wound was Gradually Appeared. The date acquired was: 09/21/2019. The wound has been in treatment 36 weeks. The wound is located on the Right Calcaneus. The wound measures 0.1cm length x 0.1cm width x 0.1cm depth; 0.008cm^2 area and 0.001cm^3 volume. There is Fat Layer (Subcutaneous Tissue) exposed. There is no tunneling or undermining noted. There is a medium amount of serosanguineous drainage noted. The wound margin is flat and intact. There is large (67-100%) pink granulation within the wound bed. There is a small (1-33%) amount of necrotic tissue within the wound bed including Adherent Slough. Wound #6 status is Open. Original cause of wound was Pressure Injury. The date acquired was: 05/01/2020. The wound has been in treatment 8 weeks. The wound is located on the Left T Fifth. The wound measures 0.1cm length x  0.1cm width x 0.1cm depth; 0.008cm^2 area and 0.001cm^3 volume. There is Fat oe Layer (Subcutaneous Tissue) exposed. There is no tunneling or undermining noted. There is a small amount of serosanguineous drainage noted. The wound margin is distinct with the outline attached to the wound base. There is large (67-100%) pink granulation within the wound bed. There is no necrotic tissue within the wound bed. Assessment Active Problems ICD-10 Type 2  diabetes mellitus with foot ulcer Non-pressure chronic ulcer of other part of right foot with necrosis of bone Type 2 diabetes mellitus with diabetic neuropathy, unspecified Non-pressure chronic ulcer of right heel and midfoot with fat layer exposed Non-pressure chronic ulcer of other part of left foot limited to breakdown of skin Plan Follow-up Appointments: Return appointment in 1 month. Bathing/ Shower/ Hygiene: May shower and wash wound with soap and water. - with dressing changes Edema Control - Lymphedema / SCD / Other: Elevate legs to the level of the heart or above for 30 minutes daily and/or when sitting, a frequency of: Avoid standing for long periods of time. Moisturize legs daily. Additional Orders / Instructions: Other: - Apply Desitin Zinc oxide redness, excoriated areas to buttock area daily and as needed for protection. Home Health: New wound care orders this week; continue Home Health for wound care. May utilize formulary equivalent dressing for wound treatment orders unless otherwise specified. - Advance home health to monitor weekly of right great toe. WOUND #2: - T Great Wound Laterality: Right oe Cleanser: Soap and Water 1 x Per Day/30 Days Discharge Instructions: May shower and wash wound with dial antibacterial soap and water prior to dressing change. Prim Dressing: betadine 1 x Per Day/30 Days ary Discharge Instructions: to toe daily WOUND #3: - Calcaneus Wound Laterality: Right Cleanser: Soap and Water 1 x Per Day/30 Days Discharge Instructions: May shower and wash wound with dial antibacterial soap and water prior to dressing change. Prim Dressing: betadine 1 x Per Day/30 Days ary Discharge Instructions: apply betadine daily Secondary Dressing: ALLEVYN Heel 4 1/2in x 5 1/2in / 10.5cm x 13.5cm 1 x Per Day/30 Days Discharge Instructions: Apply over primary dressing as directed. Secured With: [employer], 4.5x3.1 (in/yd) (Generic) 1 x Per Day/30  Days Discharge Instructions: Secure with Kerlix as directed. Secured With: Paper T ape, 2x10 (in/yd) (Generic) 1 x Per Day/30 Days Discharge Instructions: Secure dressing with tape as directed. WOUND #6: - T Fifth Wound Laterality: Left oe Prim Dressing: Maxorb Extra Calcium Alginate 2x2 in Every Other Day/30 Days ary Discharge Instructions: Apply calcium alginate to wound bed as instructed Secondary Dressing: Woven Gauze Sponges 2x2 in Every Other Day/30 Days Discharge Instructions: Apply over primary dressing as directed. Secured With: Child psychotherapist, Sterile 2x75 (in/in) Every Other Day/30 Days Discharge Instructions: Ensure to take ensure the toe is does not bend. .Secure with stretch gauze as directed. 1. Would recommend currently that we continue with Betadine to the great toe I think that is really the best thing to do to try to keep this dry and stable as possible. Dry gauze over top of that for protection as needed. 2. Would recommend as well that we continue with the silver alginate for the left fifth toe which I think is doing a good job hopefully this will drop to be healed by next visit. 3. I am also can recommend that the Betadine be used for the heel as well which mainly has an eschar that I think will probably be often done by  the next visit. We will see patient back for reevaluation in 1 Month here in the clinic. If anything worsens or changes patient will contact our office for additional recommendations. Electronic Signature(s) Signed: 06/27/2020 5:27:03 PM By: Worthy Keeler PA-C Entered By: Worthy Keeler on 06/27/2020 17:27:03 -------------------------------------------------------------------------------- SuperBill Details Patient Name: Date of Service: [name] [name] 06/27/2020 Medical Record Number: [id number] Patient Account Number: [id number] Date of Birth/Sex: Treating RN: 1923-06-10 (87 y.o. Lorette Ang, Tammi Klippel Primary Care Provider:  Sherrie Mustache Other Clinician: Referring Provider: Treating Provider/Extender: Jerral Bonito in Treatment: 48 Diagnosis Coding ICD-10 Codes Code Description E11.621 Type 2 diabetes mellitus with foot ulcer L97.514 Non-pressure chronic ulcer of other part of right foot with necrosis of bone E11.40 Type 2 diabetes mellitus with diabetic neuropathy, unspecified L97.412 Non-pressure chronic ulcer of right heel and midfoot with fat layer exposed L97.521 Non-pressure chronic ulcer of other part of left foot limited to breakdown of skin Facility Procedures CPT4 Code: [id number] Description: [phone] - WOUND CARE VISIT-LEV 5 EST PT Modifier: Quantity: 1 Physician Procedures : CPT4 Code Description Modifier [id number] - WC PHYS LEVEL 3 - EST PT ICD-10 Diagnosis Description E11.621 Type 2 diabetes mellitus with foot ulcer L97.514 Non-pressure chronic ulcer of other part of right foot with necrosis of bone E11.40 Type 2  diabetes mellitus with diabetic neuropathy, unspecified L97.412 Non-pressure chronic ulcer of right heel and midfoot with fat layer exposed Quantity: 1 Electronic Signature(s) Signed: 06/27/2020 5:27:34 PM By: Worthy Keeler PA-C Entered By: Worthy Keeler on 06/27/2020 17:27:34

## 2020-07-01 DIAGNOSIS — L97519 Non-pressure chronic ulcer of other part of right foot with unspecified severity: Secondary | ICD-10-CM | POA: Diagnosis not present

## 2020-07-01 DIAGNOSIS — Z515 Encounter for palliative care: Secondary | ICD-10-CM | POA: Diagnosis not present

## 2020-07-01 DIAGNOSIS — C7951 Secondary malignant neoplasm of bone: Secondary | ICD-10-CM | POA: Diagnosis not present

## 2020-07-01 DIAGNOSIS — C61 Malignant neoplasm of prostate: Secondary | ICD-10-CM | POA: Diagnosis not present

## 2020-07-02 ENCOUNTER — Encounter: Payer: Self-pay | Admitting: Nurse Practitioner

## 2020-07-03 ENCOUNTER — Telehealth: Payer: Self-pay

## 2020-07-03 ENCOUNTER — Emergency Department (HOSPITAL_COMMUNITY): Payer: Medicare Other

## 2020-07-03 ENCOUNTER — Other Ambulatory Visit: Payer: Self-pay

## 2020-07-03 ENCOUNTER — Emergency Department (HOSPITAL_COMMUNITY)
Admission: EM | Admit: 2020-07-03 | Discharge: 2020-07-03 | Disposition: A | Discharge status: Home / Self Care | Payer: Medicare Other | Attending: Emergency Medicine | Admitting: Emergency Medicine

## 2020-07-03 DIAGNOSIS — Z7901 Long term (current) use of anticoagulants: Secondary | ICD-10-CM | POA: Insufficient documentation

## 2020-07-03 DIAGNOSIS — R002 Palpitations: Secondary | ICD-10-CM | POA: Insufficient documentation

## 2020-07-03 DIAGNOSIS — I10 Essential (primary) hypertension: Secondary | ICD-10-CM | POA: Diagnosis not present

## 2020-07-03 DIAGNOSIS — R000 Tachycardia, unspecified: Secondary | ICD-10-CM | POA: Insufficient documentation

## 2020-07-03 DIAGNOSIS — Z20822 Contact with and (suspected) exposure to covid-19: Secondary | ICD-10-CM | POA: Insufficient documentation

## 2020-07-03 DIAGNOSIS — Z7982 Long term (current) use of aspirin: Secondary | ICD-10-CM | POA: Insufficient documentation

## 2020-07-03 DIAGNOSIS — J9809 Other diseases of bronchus, not elsewhere classified: Secondary | ICD-10-CM | POA: Diagnosis not present

## 2020-07-03 DIAGNOSIS — Z79899 Other long term (current) drug therapy: Secondary | ICD-10-CM | POA: Diagnosis not present

## 2020-07-03 DIAGNOSIS — R0989 Other specified symptoms and signs involving the circulatory and respiratory systems: Secondary | ICD-10-CM | POA: Insufficient documentation

## 2020-07-03 DIAGNOSIS — F0390 Unspecified dementia without behavioral disturbance: Secondary | ICD-10-CM | POA: Diagnosis not present

## 2020-07-03 DIAGNOSIS — Z8546 Personal history of malignant neoplasm of prostate: Secondary | ICD-10-CM | POA: Diagnosis not present

## 2020-07-03 DIAGNOSIS — Z85828 Personal history of other malignant neoplasm of skin: Secondary | ICD-10-CM | POA: Diagnosis not present

## 2020-07-03 DIAGNOSIS — I4891 Unspecified atrial fibrillation: Secondary | ICD-10-CM | POA: Diagnosis not present

## 2020-07-03 DIAGNOSIS — I517 Cardiomegaly: Secondary | ICD-10-CM | POA: Diagnosis not present

## 2020-07-03 DIAGNOSIS — E119 Type 2 diabetes mellitus without complications: Secondary | ICD-10-CM | POA: Diagnosis not present

## 2020-07-03 DIAGNOSIS — Z7984 Long term (current) use of oral hypoglycemic drugs: Secondary | ICD-10-CM | POA: Insufficient documentation

## 2020-07-03 DIAGNOSIS — K224 Dyskinesia of esophagus: Secondary | ICD-10-CM

## 2020-07-03 DIAGNOSIS — J90 Pleural effusion, not elsewhere classified: Secondary | ICD-10-CM | POA: Diagnosis not present

## 2020-07-03 LAB — CBC WITH DIFFERENTIAL/PLATELET
Abs Immature Granulocytes: 0.16 10*3/uL — ABNORMAL HIGH (ref 0.00–0.07)
Basophils Absolute: 0 10*3/uL (ref 0.0–0.1)
Basophils Relative: 0 %
Eosinophils Absolute: 0.1 10*3/uL (ref 0.0–0.5)
Eosinophils Relative: 1 %
HCT: 32 % — ABNORMAL LOW (ref 39.0–52.0)
Hemoglobin: 11 g/dL — ABNORMAL LOW (ref 13.0–17.0)
Immature Granulocytes: 2 %
Lymphocytes Relative: 26 %
Lymphs Abs: 2.5 10*3/uL (ref 0.7–4.0)
MCH: 29.9 pg (ref 26.0–34.0)
MCHC: 34.4 g/dL (ref 30.0–36.0)
MCV: 87 fL (ref 80.0–100.0)
Monocytes Absolute: 1.5 10*3/uL — ABNORMAL HIGH (ref 0.1–1.0)
Monocytes Relative: 15 %
Neutro Abs: 5.5 10*3/uL (ref 1.7–7.7)
Neutrophils Relative %: 56 %
Platelets: 280 10*3/uL (ref 150–400)
RBC: 3.68 MIL/uL — ABNORMAL LOW (ref 4.22–5.81)
RDW: 13.3 % (ref 11.5–15.5)
WBC: 9.7 10*3/uL (ref 4.0–10.5)
nRBC: 0 % (ref 0.0–0.2)

## 2020-07-03 LAB — BASIC METABOLIC PANEL
Anion gap: 8 (ref 5–15)
BUN: 16 mg/dL (ref 8–23)
CO2: 24 mmol/L (ref 22–32)
Calcium: 9.7 mg/dL (ref 8.9–10.3)
Chloride: 105 mmol/L (ref 98–111)
Creatinine, Ser: 0.92 mg/dL (ref 0.61–1.24)
GFR, Estimated: >60 mL/min (ref >60)
Glucose, Bld: 151 mg/dL — ABNORMAL HIGH (ref 70–99)
Potassium: 4.5 mmol/L (ref 3.5–5.1)
Sodium: 137 mmol/L (ref 135–145)

## 2020-07-03 LAB — RESP PANEL BY RT-PCR (FLU A&B, COVID) ARPGX2
Influenza A by PCR: NEGATIVE
Influenza B by PCR: NEGATIVE
SARS Coronavirus 2 by RT PCR: NEGATIVE

## 2020-07-03 LAB — TROPONIN I (HIGH SENSITIVITY): Troponin I (High Sensitivity): 5 ng/L (ref <18)

## 2020-07-03 MED ORDER — SODIUM CHLORIDE 0.9 % IV BOLUS
500.0000 mL | Freq: Once | INTRAVENOUS | Status: AC
  Administered 2020-07-03: 500 mL via INTRAVENOUS

## 2020-07-03 MED ORDER — PANTOPRAZOLE SODIUM 40 MG IV SOLR
40.0000 mg | Freq: Once | INTRAVENOUS | Status: DC
  Filled 2020-07-03: qty 40

## 2020-07-03 MED ORDER — IOHEXOL 300 MG/ML  SOLN
75.0000 mL | Freq: Once | INTRAMUSCULAR | Status: AC | PRN
  Administered 2020-07-03: 75 mL via INTRAVENOUS

## 2020-07-03 MED ORDER — GLUCAGON HCL RDNA (DIAGNOSTIC) 1 MG IJ SOLR
0.5000 mg | Freq: Once | INTRAMUSCULAR | Status: AC
  Administered 2020-07-03: 0.5 mg via INTRAVENOUS

## 2020-07-03 MED ORDER — DILTIAZEM HCL 25 MG/5ML IV SOLN
10.0000 mg | Freq: Once | INTRAVENOUS | Status: AC
  Administered 2020-07-03: 10 mg via INTRAVENOUS
  Filled 2020-07-03: qty 5

## 2020-07-03 MED ORDER — METOCLOPRAMIDE HCL 5 MG/ML IJ SOLN
10.0000 mg | Freq: Once | INTRAMUSCULAR | Status: AC
  Administered 2020-07-03: 10 mg via INTRAVENOUS
  Filled 2020-07-03: qty 2

## 2020-07-03 MED ORDER — ALUM & MAG HYDROXIDE-SIMETH 200-200-20 MG/5ML PO SUSP
30.0000 mL | Freq: Once | ORAL | Status: AC
  Administered 2020-07-03: 30 mL via ORAL
  Filled 2020-07-03: qty 30

## 2020-07-03 MED ORDER — LIDOCAINE VISCOUS HCL 2 % MT SOLN
15.0000 mL | Freq: Once | OROMUCOSAL | Status: AC
  Administered 2020-07-03: 15 mL via ORAL
  Filled 2020-07-03: qty 15

## 2020-07-03 MED ORDER — DIPHENHYDRAMINE HCL 50 MG/ML IJ SOLN
12.5000 mg | Freq: Once | INTRAMUSCULAR | Status: AC
  Administered 2020-07-03: 12.5 mg via INTRAVENOUS
  Filled 2020-07-03: qty 1

## 2020-07-03 NOTE — ED Notes (Signed)
Patient verbalized understanding of discharge instructions. Opportunity for questions and answers.  

## 2020-07-03 NOTE — ED Notes (Signed)
Daughter is taking pt home

## 2020-07-03 NOTE — Discharge Instructions (Addendum)
You have chronic esophageal dysmotility.  Please crush your meds and eat them in applesauce  Recommend thickened liquids  Please let Dr. Laural Golden know. You will need to follow up with him.   Return to ER if you have trouble swallowing, food stuck in your esophagus, vomiting.

## 2020-07-03 NOTE — Telephone Encounter (Signed)
Bethena Roys, speech terapist with [facility] called wanting a STAT order to see patient today because he is unable to swallow anything today. She would like to assess the situation before he is taken to the hospital. I spoke with Sherrie Mustache, NP directly and she approved verbal order to see patient right away. I informed Bethena Roys that Sherrie Mustache, NP had approved, she verbalized her understanding.

## 2020-07-03 NOTE — Telephone Encounter (Signed)
Speech therapist Bethena Roys called back stating that she had assessed patietn and he was unable to get anything dow even water. She wanted to speak with Sherrie Mustache, NP about alternatives, since patient's family did not want to take him to the hospital. I spoke with Sherrie Mustache, Np directly and she suggested that if patient wasn't eating or drinking then they needed to take patient to hospital or the other option is to refer to Hospice care. Bethena Roys was informed of these options and will discuss with family.

## 2020-07-03 NOTE — ED Triage Notes (Signed)
Pt reports choking on his egg sandwich this morning, and still feeling like he has some stuck in his throat. Has been able to swallow a bit of milkshake since then, but has had to spit up some other food he tried to eat. Denies pain or shortness of breath.

## 2020-07-03 NOTE — ED Provider Notes (Signed)
[facility] EMERGENCY DEPARTMENT Provider Note   CSN: [id number] Arrival date & time: 07/03/20  1728     History Chief Complaint  Patient presents with  . Choking    [name] is a 87 y.o. male hx of A. fib on Eliquis, dementia, diabetes, GIST tumor here presenting with possible choking on food.  He states that he ate a egg sandwich this morning and felt that he was choking on it.  He states that it is hard for him to keep anything down but he was able to keep down a milkshake.  He states that was unable to eat anything else afterwards he has some palpitations as well.  Patient states that his GI doctor is up in [facility].  Patient states that he endoscopy and had dilation of his esophagus before.   HPI     Past Medical History:  Diagnosis Date  . Acute bronchitis   . Acute lower UTI 11/25/2017  . Amputation of toe of left foot ([place]) 07/30/2017, 08/27/2017    Great toe 07/30/2017 Second toe 08/27/2017   . Arthritis   . Aspiration pneumonia ([place]) 01/31/2018  . Atrial fibrillation ([place])    Dr. Harl Bowie- [facility] follows saw 11'14  . Bladder stones    tx. with oral meds and antibiotics, now surgery planned  . Bone metastases ([place]) 08/21/2015  . Cataracts, both eyes    surgery planned May 2015  . Community acquired pneumonia 04/16/2018  . Cystoid macular edema 04/21/2015    Right eye 04/21/2015   . Dehydration 04/16/2018  . Dementia without behavioral disturbance ([place]) 04/16/2018  . Diabetes mellitus without complication ([place])    Type II  . Diarrhea 11/25/2017  . Dyspnea    with activity  . Dysrhythmia    Afib  . Family history of breast cancer   . Family history of colon cancer   . Genetic testing 02/09/2017  . GERD (gastroesophageal reflux disease)   . 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 Margot Chimes, thus far without recurrence.   Marland Kitchen History of MRI of lumbar spine 03/05/2014  . Hypertension   . Hypertrophy of prostate with urinary obstruction   . Hypomagnesemia 03/16/2017  . Metabolic encephalopathy 18/56/3149  . Metastatic adenocarcinoma to prostate ([place]) 09/08/2013  . 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 NHL (non-Hodgkin's lymphoma) ([place]) 2005   Diffuse large B-cell lymphoma, clinically stage IIIA, CD20 positive, status post cervical lymph node biopsy 08/03/2003 on the left. PET scan was also positive in the spleen and small bowel region, but bone marrow aspiration and biopsy were negative. So he essentially had stage IIIAs. He received R-CHOP x6 cycles with CR established by PET scan criteria on 11/23/2003 with no evidence for relapse th  . Numbness 02/19/2017   Per [facility] new patient packet  . Osteomyelitis ([place]) 06/29/2017   toe of left foot   . PAD (peripheral artery disease) ([place]) 08/27/2017  . Polyneuropathy 02/19/2017  . Postural dizziness with presyncope 02/19/2017  . S/P TURP 08/10/2013  . Sepsis ([place]) 07/02/2018  . Skin cancer    Basal cell- face,head, neck,  arms, legs, Back  . Urinary frequency 02/19/2017    Patient Active Problem List   Diagnosis Date Noted  . Palliative care by  specialist   . Goals of care, counseling/discussion   . Encounter for hospice care discussion   . DNR (do not resuscitate)   . DNI (do not intubate)   . Toe ulcer ([place]) 09/29/2019  . Pressure injury of skin 09/22/2019  . Vagal reaction 09/20/2019  . Cerumen debris on tympanic membrane of both ears 08/24/2019  . Type 1 diabetes mellitus with diabetic neuropathy, unspecified ([place]) 02/22/2019  . Memory loss, short term 02/22/2019  . Sepsis secondary to UTI ([place]) 07/02/2018  . HCAP (healthcare-associated pneumonia) 07/02/2018  . CAP (community acquired pneumonia) 04/16/2018  . Dementia  without behavioral disturbance ([place]) 04/16/2018  . Dehydration 04/16/2018  . Aspiration pneumonia ([place]) 01/31/2018  . Diarrhea 11/25/2017  . PAD (peripheral artery disease) ([place]) 08/27/2017  . Great toe amputation status, left 07/30/2017  . Osteomyelitis of toe of left foot ([place]) 06/29/2017  . Acute bronchitis with bronchospasm 03/16/2017  . Type 2 diabetes mellitus ([place]) 03/16/2017  . Hypomagnesemia 03/16/2017  . 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 Postural dizziness with presyncope 02/19/2017  . Polyneuropathy 02/19/2017  . Numbness 02/19/2017  . Urinary frequency 02/19/2017  . Genetic testing 02/09/2017  . Family history of breast cancer   . Family history of colon cancer   . Other dysphagia 01/21/2016  . Bone metastases ([place]) 08/21/2015  . Prostate cancer ([place]) 09/08/2013  . Hypertrophy of prostate with urinary obstruction and other lower urinary tract symptoms (LUTS) 08/10/2013  . NHL (non-Hodgkin's lymphoma) ([place]) 08/18/2011  . GIST (gastrointestinal stromal tumor), malignant ([place]) 08/18/2011  . Long term (current) use of anticoagulants 07/18/2010  . Essential hypertension 10/04/2008  . ATRIAL FIBRILLATION 10/04/2008    Past Surgical History:  Procedure Laterality Date  . ABDOMINAL AORTOGRAM W/LOWER EXTREMITY N/A 09/20/2019   Procedure: ABDOMINAL AORTOGRAM W/LOWER EXTREMITY;  Surgeon: Marty Heck, MD;  Location: [facility];  Service: Cardiovascular;  Laterality: N/A;  . AMPUTATION Left 06/29/2017   Procedure: AMPUTATION LEFT GREAT TOE;  Surgeon: [name], MD;  Location: [facility];  Service: Vascular;  Laterality: Left;  . AMPUTATION Left 08/27/2017   Procedure: AMPUTATION Left SECOND TOE;  Surgeon: [name], MD;  Location: [facility];  Service: Vascular;  Laterality: Left;  . CATARACT EXTRACTION Bilateral 2015  . CHOLECYSTECTOMY    . COLON RESECTION     small bowel  . CYSTOSCOPY WITH LITHOLAPAXY N/A 08/10/2013   Procedure:  CYSTOSCOPY WITH LITHOLAPAXY WITH [name];  Surgeon: [name], MD;  Location: [facility];  Service: Urology;  Laterality: N/A;  . ESOPHAGEAL DILATION N/A 02/27/2016   Procedure: ESOPHAGEAL DILATION;  Surgeon: [name], MD;  [facility];  Service: Endoscopy;  Laterality: N/A;  . ESOPHAGEAL DILATION N/A 07/07/2018   Procedure: ESOPHAGEAL DILATION;  Surgeon: [name], MD;  [facility];  Service: Endoscopy;  Laterality: N/A;  . ESOPHAGOGASTRODUODENOSCOPY N/A 02/27/2016   Procedure: ESOPHAGOGASTRODUODENOSCOPY (EGD);  Surgeon: [name], MD;  [facility];  Service: Endoscopy;  Laterality: N/A;  12:00  . ESOPHAGOGASTRODUODENOSCOPY N/A 07/07/2018   Procedure: ESOPHAGOGASTRODUODENOSCOPY (EGD);  Surgeon: [name], MD;  [facility];  Service: Endoscopy;  Laterality: N/A;  . INCISION AND DRAINAGE PERIRECTAL ABSCESS    . LOWER EXTREMITY ANGIOGRAPHY N/A 06/25/2017   Procedure: LOWER EXTREMITY ANGIOGRAPHY;  Surgeon: [name], MD;  Location: [facility];  Service: Cardiovascular;  Laterality: N/A;  . LYMPH NODE DISSECTION Left    '05-neck  .  PERIPHERAL VASCULAR ATHERECTOMY Right 09/20/2019   Procedure: PERIPHERAL VASCULAR ATHERECTOMY;  Surgeon: Marty Heck, MD;  Location: [facility];  Service: Cardiovascular;  Laterality: Right;  Posterior tibial  . PORT-A-CATH REMOVAL    . PORTACATH PLACEMENT     insertion and removal -last chemotherapy 10 yrs ago  . TRANSURETHRAL RESECTION OF PROSTATE N/A 08/10/2013   Procedure: TRANSURETHRAL RESECTION OF THE PROSTATE WITH GYRUS INSTRUMENTS;  Surgeon: [name], MD;  [facility];  Service: Urology;  Laterality: N/A;       Family History  Problem Relation Age of Onset  . Heart attack Mother        died in her 30s  . Other Father 76       pneumonia - died  . Breast cancer Sister        dx in her 61s-60s  . Colon cancer Brother        dx in his  22s  . Cancer Sister        TAH/BSO due to "male cancer"  . Breast cancer Daughter 57  . Breast cancer Daughter 30       DCIS - ATM VUS on [employer] 83 gene panel in 2018  . Breast cancer Daughter 42       DCIS - Negative on [employer] MyRisk 25 gene apnel in 2015  . Thyroid cancer Daughter 75       Medullary and Papillary  . Thyroid nodules Grandchild     Social History   Tobacco Use  . Smoking status: Never Smoker  . Smokeless tobacco: Never Used  Vaping Use  . Vaping Use: Never used  Substance Use Topics  . Alcohol use: No  . Drug use: No    Home Medications Prior to Admission medications   Medication Sig Start Date End Date Taking? Authorizing Provider  acetaminophen (TYLENOL) 500 MG tablet Take 500 mg by mouth at bedtime.    [provider]  albuterol (2.5 MG/3ML) 0.083% NEBU 3 mL, albuterol (5 MG/ML) 0.5% NEBU 0.5 mL Inhale into the lungs 3 (three) times daily as needed.    [provider]  apixaban (ELIQUIS) 5 MG TABS tablet Take 1 tablet (5 mg total) by mouth 2 (two) times daily. 04/15/20   Arnoldo Lenis, MD  Aspirin 81 MG CAPS Take 81 mg by mouth daily.     [provider]  blood glucose meter kit and supplies Dispense based on patient and insurance preference. Use four times daily as directed. (FOR ICD-10 E10.9, E11.9). 03/30/19   Corum, Rex Kras, MD  cephALEXin (KEFLEX) 250 MG capsule Take 250 mg by mouth at bedtime. Preventative for UTI prescribed by urology    [provider]  Cholecalciferol (VITAMIN D3) 1000 units CAPS Take 1,000 Units by mouth daily.     [provider]  Cranberry 500 MG TABS Take 500 mg by mouth daily.     [provider]  denosumab (XGEVA) 120 MG/1.7ML SOLN injection Inject 120 mg into the skin every 30 (thirty) days.    [provider]  Dextromethorphan-guaiFENesin [facility] DM PO) Take by mouth 2 (two) times daily.    [provider]  diclofenac Sodium (VOLTAREN) 1 % GEL  Apply 2 g topically 4 (four) times daily. Patient taking differently: Apply 2 g topically as needed. 09/23/19   Regalado, Belkys A, MD  diltiazem (CARDIZEM SR) 60 MG 12 hr capsule Take 1 capsule (60 mg total) by mouth 2 (two) times daily.  05/14/20   Imogene Burn, PA-C  donepezil (ARICEPT) 10 MG tablet Take 1 tablet by mouth once daily 06/11/20   [name], NP  furosemide (LASIX) 20 MG tablet Take 0.5 tablets (10 mg total) by mouth daily. Take for 5 day, then change to as needed for shortness of breath for pleural effusion. Patient taking differently: Take 20 mg by mouth daily. 04/16/20   Ngetich, Dinah C, NP  glipiZIDE (GLUCOTROL XL) 5 MG 24 hr tablet Take 1 tablet (5 mg total) by mouth daily with breakfast. 03/28/20   [name], NP  glucose blood (EASYMAX TEST) test strip Use as instructed 08/24/19   Maryruth Hancock, MD  Lancets 28G MISC 1 kit by Does not apply route every morning. 07/18/19   Corum, Rex Kras, MD  Leuprolide Acetate, 6 Month, (LUPRON DEPOT, 37-MONTH,) 45 MG injection Inject 45 mg into the muscle every 6 (six) months.    [provider]  lisinopril (ZESTRIL) 20 MG tablet Take 1 tablet (20 mg total) by mouth in the morning and at bedtime. 11/22/19 02/22/20  Arnoldo Lenis, MD  metFORMIN (GLUCOPHAGE) 850 MG tablet Take 1 tablet (850 mg total) by mouth 2 (two) times daily with a meal. 05/31/20   [name], NP  mirtazapine (REMERON) 15 MG tablet Take 1 tablet (15 mg total) by mouth at bedtime. 03/28/20 04/27/20  [name], NP  Multiple Vitamin (MULTIVITAMIN WITH MINERALS) TABS tablet Take 1 tablet by mouth daily.     [provider]  omeprazole (PRILOSEC) 40 MG capsule Take 1 capsule (40 mg total) by mouth daily. 06/18/20   [name], NP  predniSONE (DELTASONE) 5 MG tablet Take 2.5 mg by mouth daily. To stop after 4 weeks 12/10/19   [provider]  Probiotic Product (PROBIOTIC DAILY PO) Take 1 tablet by mouth daily. Every morning      [provider]  simethicone (MYLICON) 80 MG chewable tablet Chew 80 mg by mouth daily.    [provider]    Allergies    Tape and Augmentin [amoxicillin-pot clavulanate]  Review of Systems   Review of Systems  Gastrointestinal:       Choking   All other systems reviewed and are negative.   Physical Exam Updated Vital Signs BP (!) 138/91   Pulse 92   Temp 97.9 F (36.6 C) (Oral)   Resp (!) 23   SpO2 100%   Physical Exam Vitals and nursing note reviewed.  HENT:     Head: Normocephalic.     Nose: Nose normal.     Mouth/Throat:     Mouth: Mucous membranes are dry.  Eyes:     Extraocular Movements: Extraocular movements intact.     Pupils: Pupils are equal, round, and reactive to light.  Cardiovascular:     Rate and Rhythm: Tachycardia present. Rhythm irregular.     Pulses: Normal pulses.     Heart sounds: Normal heart sounds.  Pulmonary:     Effort: Pulmonary effort is normal.     Breath sounds: Normal breath sounds.  Abdominal:     General: Abdomen is flat.     Palpations: Abdomen is soft.  Musculoskeletal:        General: Normal range of motion.     Cervical back: Normal range of motion and neck supple.  Skin:    General: Skin is warm.     Capillary Refill: Capillary refill takes less than 2 seconds.  Neurological:  General: No focal deficit present.     Mental Status: He is alert.  Psychiatric:        Mood and Affect: Mood normal.     ED Results / Procedures / Treatments   Labs (all labs ordered are listed, but only abnormal results are displayed) Labs Reviewed  CBC WITH DIFFERENTIAL/PLATELET - Abnormal; Notable for the following components:      Result Value   RBC 3.68 (*)    Hemoglobin 11.0 (*)    HCT 32.0 (*)    Monocytes Absolute 1.5 (*)    Abs Immature Granulocytes 0.16 (*)    All other components within normal limits  BASIC METABOLIC PANEL - Abnormal; Notable for the following components:   Glucose, Bld 151 (*)     All other components within normal limits  RESP PANEL BY RT-PCR (FLU A&B, COVID) ARPGX2  TROPONIN I (HIGH SENSITIVITY)    EKG EKG Interpretation  Date/Time:  Wednesday July 03 2020 18:33:10 EDT Ventricular Rate:  122 PR Interval:    QRS Duration: 139 QT Interval:  349 QTC Calculation: 498 R Axis:   91 Text Interpretation: Atrial fibrillation Right bundle branch block ST depression, consider ischemia, diffuse lds Minimal ST elevation, lateral leads No significant change since last tracing Confirmed by Wandra Arthurs [phone]) on 07/03/2020 6:40:51 PM   Radiology CT Chest W Contrast  Result Date: 07/03/2020 CLINICAL DATA:  Cough. Possible pneumomediastinum on x-ray. Possible fluid in function. Foreign body sensation in throat. EXAM: CT CHEST WITH CONTRAST TECHNIQUE: Multidetector CT imaging of the chest was performed during intravenous contrast administration. CONTRAST:  69m OMNIPAQUE IOHEXOL 300 MG/ML  SOLN COMPARISON:  Chest radiograph earlier today.  Chest CT 04/15/2017 FINDINGS: Cardiovascular: Aortic atherosclerosis and tortuosity. No dissection or acute aortic findings. Mild cardiomegaly. Occasional coronary artery calcifications. No filling defects in the central pulmonary arteries to the lobar level, exam not tailored to pulmonary artery evaluation. Mediastinum/Nodes: No pneumomediastinum. Radiographic appearance was due to gaseous distension of the upper esophagus. The esophagus is dilated, air-filled proximally, contains dependent intraluminal contents in the mid and distal aspect. Distal esophagus is tortuous and courses left laterally. Question of irregular esophageal wall thickening. No mediastinal or hilar adenopathy. No suspicious thyroid nodule. Lungs/Pleura: Small left pleural effusion. Slight ground-glass and reticulonodular airspace disease in the dependent right greater than left lower lobe. No debris within the trachea or central bronchi. Mild lower lobe bronchial thickening. No  pulmonary mass. No pneumothorax. Upper Abdomen: Pancreatic calcifications and ductal dilatation typical of chronic pancreatitis. No peripancreatic fat stranding. Cholecystectomy. No acute findings in the upper abdomen. Musculoskeletal: Chronic T12 superior endplate irregularity with prominent Schmorl's nodes/mild compression fracture. Chronic regularity of upper sternal body, unchanged this is unchanged from prior exam. No acute osseous abnormalities are seen. IMPRESSION: 1. The esophagus is dilated, air-filled proximally, contains dependent intraluminal contents in the mid and distal aspect. Mild irregular distal esophageal wall thickening. Suspect chronic esophageal dysmotility, with patulous esophagus seen on prior exam to a lesser extent. 2. Small left pleural effusion. Lower lobe bronchial thickening with mild ground-glass and reticulonodular airspace disease in the dependent right greater than left lower lobe is suspicious for aspiration. 3. Mild cardiomegaly.  Coronary artery calcifications. 4. Chronic pancreatitis. Aortic Atherosclerosis (ICD10-I70.0). Electronically Signed   By: MKeith RakeM.D.   On: 07/03/2020 20:58   DG Chest Port 1 View  Result Date: 07/03/2020 CLINICAL DATA:  Aspiration something stuck in throat EXAM: PORTABLE CHEST 1 VIEW COMPARISON:  04/16/2020 FINDINGS: No  focal consolidation. Trace right effusion. Mild cardiomegaly. Aortic atherosclerosis. No pneumothorax. Possible air distension of the esophagus. IMPRESSION: 1. Trace right pleural effusion or thickening. No focal airspace disease. 2. Possible air distension of the esophagus. CT chest could be obtained if continued concern for esophageal foreign body. Electronically Signed   By: Donavan Foil M.D.   On: 07/03/2020 20:01    Procedures Procedures   Medications Ordered in ED Medications  metoCLOPramide (REGLAN) injection 10 mg (10 mg Intravenous Given 07/03/20 1836)  diphenhydrAMINE (BENADRYL) injection 12.5 mg (12.5  mg Intravenous Given 07/03/20 1835)  glucagon (human recombinant) (GLUCAGEN) injection 0.5 mg (0.5 mg Intravenous Given 07/03/20 1837)  sodium chloride 0.9 % bolus 500 mL (0 mLs Intravenous Stopped 07/03/20 2003)  diltiazem (CARDIZEM) injection 10 mg (10 mg Intravenous Given 07/03/20 1851)  alum & mag hydroxide-simeth (MAALOX/MYLANTA) 200-200-20 MG/5ML suspension 30 mL (30 mLs Oral Given 07/03/20 1925)    And  lidocaine (XYLOCAINE) 2 % viscous mouth solution 15 mL (15 mLs Oral Given 07/03/20 1926)  iohexol (OMNIPAQUE) 300 MG/ML solution 75 mL (75 mLs Intravenous Contrast Given 07/03/20 2043)    ED Course  I have reviewed the triage vital signs and the nursing notes.  Pertinent labs & imaging results that were available during my care of the patient were reviewed by me and considered in my medical decision making (see chart for details).    MDM Rules/Calculators/A&P                         [name] is a 87 y.o. male here presenting with a possible choking episode.  He states that he may have food impaction.  However patient is tolerating his secretions.  I do not see anything in the oropharynx. 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  Plan to give glucagon and Reglan and Benadryl.  Will try and p.o. trial as well.  Patient appears to be in rapid A. fib as well he is on Cardizem and Eliquis.  Will give Cardizem to help with rate control  9:55 PM HR normal now.  X-ray showed dilated esophagus.  CT showed dilated esophagus with air and contents in the mid and distal aspect.  There is a suspicion for chronic esophageal dysmotility.  Patient is able to tolerate water and GI cocktail.  I discussed case with Dr. Collene Mares from GI.  She states that this is a chronic motility issue.  She states that his primary GI doctor, Dr. Laural Golden, should be able to manage as outpatient in such as a motility study   Final Clinical Impression(s) / ED Diagnoses Final  diagnoses:  None    Rx / DC Orders ED Discharge Orders    None       Drenda Freeze, MD 07/03/20 2157

## 2020-07-05 ENCOUNTER — Other Ambulatory Visit: Payer: Self-pay | Admitting: Nurse Practitioner

## 2020-07-05 ENCOUNTER — Ambulatory Visit (INDEPENDENT_AMBULATORY_CARE_PROVIDER_SITE_OTHER): Payer: Medicare Other | Admitting: Nurse Practitioner

## 2020-07-05 ENCOUNTER — Encounter: Payer: Self-pay | Admitting: Nurse Practitioner

## 2020-07-05 ENCOUNTER — Other Ambulatory Visit: Payer: Self-pay

## 2020-07-05 VITALS — BP 100/62 | HR 55 | Temp 97.5°F | Resp 16 | Ht 70.0 in | Wt 134.8 lb

## 2020-07-05 DIAGNOSIS — M7989 Other specified soft tissue disorders: Secondary | ICD-10-CM

## 2020-07-05 DIAGNOSIS — H9191 Unspecified hearing loss, right ear: Secondary | ICD-10-CM

## 2020-07-05 DIAGNOSIS — E440 Moderate protein-calorie malnutrition: Secondary | ICD-10-CM | POA: Diagnosis not present

## 2020-07-05 DIAGNOSIS — K119 Disease of salivary gland, unspecified: Secondary | ICD-10-CM | POA: Diagnosis not present

## 2020-07-05 DIAGNOSIS — K224 Dyskinesia of esophagus: Secondary | ICD-10-CM | POA: Diagnosis not present

## 2020-07-05 DIAGNOSIS — S61309A Unspecified open wound of unspecified finger with damage to nail, initial encounter: Secondary | ICD-10-CM | POA: Diagnosis not present

## 2020-07-05 DIAGNOSIS — E1169 Type 2 diabetes mellitus with other specified complication: Secondary | ICD-10-CM

## 2020-07-05 NOTE — Progress Notes (Signed)
Careteam: Patient Care Team: [name] Chandler, NP as PCP - General (Geriatric Medicine) [name] Bowie Alphonse Guild, MD as PCP - Cardiology (Cardiology) [name] Gallo, MD as Consulting Physician (Urology) [name] Gobble, MD (Inactive) as Consulting Physician (Hematology and Oncology) [name] Balding, MD as Consulting Physician (Orthopedic Surgery) [name], DPM as Consulting Physician (Podiatry) [name] Jack, MD as Consulting Physician (Hematology) [name] Kenner, MD (Dermatology) [name] Houston, MD as Consulting Physician (Gastroenterology) [name] Dutch, MD as Consulting Physician (Vascular Surgery) Sater, [name] Means, MD (Neurology) Clinic, [name], [name] (Ophthalmology)  PLACE OF SERVICE:  [place] Directive information Does Patient Have a Medical Advance Directive?: Yes, Type of Advance Directive: Healthcare Power of [place];Living will, Does patient want to make changes to medical advance directive?: No - Patient declined  Allergies  Allergen Reactions  . Tape Other (See Comments)    SKIN IS VERY THIN AND WILL TEAR AND BRUISE VERY EASILY!!!!  . Augmentin [Amoxicillin-Pot Clavulanate] Rash and Other (See Comments)    Redness of skin    Chief Complaint  Patient presents with  . Hospitalization Follow-up    Following up for swallowing problem.      HPI: Patient is a 87 y.o. male to follow up ED visit.  Pt is here today with caregiver and daughter.   He is having thick mucous. Question is mucinex is even helping.   It was recommended to follow up with GI Dr Laural Golden to see if they want to do a motility study.  Suggested to crush pills which has helped. Taking medication with apply sauce.   Small left pleural effusion. Lower lobe bronchial thickening with mild ground-glass and reticulonodular airspace disease in the dependent right greater than left lower lobe is suspicious for Aspiration. Noted on CT-  without worsening of shortness of breath, cough, congestion.   Bottom of left arm swollen after IV was removed  4th finger on left with possible infection. In the hospital noticed redness and soreness on the edge of the fingernail  Can not hear out of right ear. Bled on night onto his pillow. Right mastoid and middle ear effusion, partially visualized. Correlation with physical exam recommended.  Limited oral intake since he has been home from the ED. He is afraid to start eating breakfast.   Toe still has dry gangrene Heal wound is almost healed.  Sacral wound improved   Review of Systems:  Review of Systems  Constitutional: Negative for chills, fever and weight loss.  HENT: Positive for ear discharge and hearing loss. Negative for congestion.   Respiratory: Negative for cough, sputum production and shortness of breath.   Cardiovascular: Negative for chest pain, palpitations and leg swelling.  Gastrointestinal: Negative for abdominal pain, constipation, diarrhea and heartburn.  Genitourinary: Negative for dysuria, frequency and urgency.  Musculoskeletal: Negative for back pain, joint pain and myalgias.       Swelling to right lower arm   Neurological: Negative for dizziness and headaches.  Psychiatric/Behavioral: Negative for depression and memory loss. The patient does not have insomnia.     Past Medical History:  Diagnosis Date  . Acute bronchitis   . Acute lower UTI 11/25/2017  . Amputation of toe of left foot ([place]) 07/30/2017, 08/27/2017    Great toe 07/30/2017 Second toe 08/27/2017   . Arthritis   . Aspiration pneumonia ([place]) 01/31/2018  . Atrial fibrillation ([place])    Dr. Harl Bowie- [facility] follows saw 11'14  . Bladder stones    tx.  with oral meds and antibiotics, now surgery planned  . Bone metastases ([place]) 08/21/2015  . Cataracts, both eyes    surgery planned May 2015  . Community acquired pneumonia 04/16/2018  . Cystoid macular edema 04/21/2015    Right eye  04/21/2015   . Dehydration 04/16/2018  . Dementia without behavioral disturbance ([place]) 04/16/2018  . Diabetes mellitus without complication ([place])    Type II  . Diarrhea 11/25/2017  . Dyspnea    with activity  . Dysrhythmia    Afib  . Family history of breast cancer   . Family history of colon cancer   . Genetic testing 02/09/2017  . GERD (gastroesophageal reflux disease)   . 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 Margot Chimes, thus far without recurrence.   Marland Kitchen History of MRI of lumbar spine 03/05/2014  . Hypertension   . Hypertrophy of prostate with urinary obstruction   . Hypomagnesemia 03/16/2017  . Metabolic encephalopathy 47/82/9562  . Metastatic adenocarcinoma to prostate ([place]) 09/08/2013  . 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 NHL (non-Hodgkin's lymphoma) ([place]) 2005   Diffuse large B-cell lymphoma, clinically stage IIIA, CD20 positive, status post cervical lymph node biopsy 08/03/2003 on the left. PET scan was also positive in the spleen and small bowel region, but bone marrow aspiration and biopsy were negative. So he essentially had stage IIIAs. He received R-CHOP x6 cycles with CR established by PET scan criteria on 11/23/2003 with no evidence for relapse th  . Numbness 02/19/2017   Per [facility] new patient packet  . Osteomyelitis ([place]) 06/29/2017   toe of left foot   . PAD (peripheral artery disease) ([place]) 08/27/2017  . Polyneuropathy 02/19/2017  . Postural dizziness with presyncope 02/19/2017  . S/P TURP 08/10/2013  . Sepsis ([place]) 07/02/2018  . Skin cancer    Basal cell- face,head, neck,  arms, legs, Back  . Urinary frequency 02/19/2017   Past Surgical History:  Procedure Laterality Date  . ABDOMINAL AORTOGRAM W/LOWER EXTREMITY  N/A 09/20/2019   Procedure: ABDOMINAL AORTOGRAM W/LOWER EXTREMITY;  Surgeon: Marty Heck, MD;  Location: [facility];  Service: Cardiovascular;  Laterality: N/A;  . AMPUTATION Left 06/29/2017   Procedure: AMPUTATION LEFT GREAT TOE;  Surgeon: [name] Dutch, MD;  Location: [facility];  Service: Vascular;  Laterality: Left;  . AMPUTATION Left 08/27/2017   Procedure: AMPUTATION Left SECOND TOE;  Surgeon: [name] Dutch, MD;  Location: [facility];  Service: Vascular;  Laterality: Left;  . CATARACT EXTRACTION Bilateral 2015  . CHOLECYSTECTOMY    . COLON RESECTION     small bowel  . CYSTOSCOPY WITH LITHOLAPAXY N/A 08/10/2013   Procedure: CYSTOSCOPY WITH LITHOLAPAXY WITH [name];  Surgeon: [name] Gallo, MD;  [facility];  Service: Urology;  Laterality: N/A;  . ESOPHAGEAL DILATION N/A 02/27/2016   Procedure: ESOPHAGEAL DILATION;  Surgeon: [name] Houston, MD;  [facility];  Service: Endoscopy;  Laterality: N/A;  . ESOPHAGEAL DILATION N/A 07/07/2018   Procedure: ESOPHAGEAL DILATION;  Surgeon: [name] Houston, MD;  [facility];  Service: Endoscopy;  Laterality: N/A;  . ESOPHAGOGASTRODUODENOSCOPY N/A 02/27/2016   Procedure: ESOPHAGOGASTRODUODENOSCOPY (EGD);  Surgeon: [name] Houston, MD;  [facility];  Service: Endoscopy;  Laterality: N/A;  12:00  . ESOPHAGOGASTRODUODENOSCOPY  N/A 07/07/2018   Procedure: ESOPHAGOGASTRODUODENOSCOPY (EGD);  Surgeon: [name] Houston, MD;  [facility];  Service: Endoscopy;  Laterality: N/A;  . INCISION AND DRAINAGE PERIRECTAL ABSCESS    . LOWER EXTREMITY ANGIOGRAPHY N/A 06/25/2017   Procedure: LOWER EXTREMITY ANGIOGRAPHY;  Surgeon: [name] Dutch, MD;  Location: [facility];  Service: Cardiovascular;  Laterality: N/A;  . LYMPH NODE DISSECTION Left    '05-neck  . PERIPHERAL VASCULAR ATHERECTOMY Right 09/20/2019   Procedure: PERIPHERAL VASCULAR ATHERECTOMY;  Surgeon: Marty Heck, MD;   Location: [facility];  Service: Cardiovascular;  Laterality: Right;  Posterior tibial  . PORT-A-CATH REMOVAL    . PORTACATH PLACEMENT     insertion and removal -last chemotherapy 10 yrs ago  . TRANSURETHRAL RESECTION OF PROSTATE N/A 08/10/2013   Procedure: TRANSURETHRAL RESECTION OF THE PROSTATE WITH GYRUS INSTRUMENTS;  Surgeon: [name] Gallo, MD;  [facility];  Service: Urology;  Laterality: N/A;   Social History:   reports that he has never smoked. He has never used smokeless tobacco. He reports that he does not drink alcohol and does not use drugs.  Family History  Problem Relation Age of Onset  . Heart attack Mother        died in her 23s  . Other Father 39       pneumonia - died  . Breast cancer Sister        dx in her 76s-60s  . Colon cancer Brother        dx in his 70s  . Cancer Sister        TAH/BSO due to "male cancer"  . Breast cancer Daughter 55  . Breast cancer Daughter 32       DCIS - ATM VUS on [employer] 83 gene panel in 2018  . Breast cancer Daughter 39       DCIS - Negative on [employer] MyRisk 25 gene apnel in 2015  . Thyroid cancer Daughter 67       Medullary and Papillary  . Thyroid nodules Grandchild     Medications: Patient's Medications  New Prescriptions   No medications on file  Previous Medications   ACETAMINOPHEN (TYLENOL) 500 MG TABLET    Take 500 mg by mouth at bedtime.   ALBUTEROL (2.5 MG/3ML) 0.083% NEBU 3 ML, ALBUTEROL (5 MG/ML) 0.5% NEBU 0.5 ML    Inhale into the lungs 3 (three) times daily as needed.   APIXABAN (ELIQUIS) 5 MG TABS TABLET    Take 1 tablet (5 mg total) by mouth 2 (two) times daily.   ASPIRIN 81 MG CAPS    Take 81 mg by mouth daily.    BLOOD GLUCOSE METER KIT AND SUPPLIES    Dispense based on patient and insurance preference. Use four times daily as directed. (FOR ICD-10 E10.9, E11.9).   CEPHALEXIN (KEFLEX) 250 MG CAPSULE    Take 250 mg by mouth at bedtime. Preventative for UTI prescribed by urology    CHOLECALCIFEROL (VITAMIN D3) 1000 UNITS CAPS    Take 1,000 Units by mouth daily.    CRANBERRY 500 MG TABS    Take 500 mg by mouth daily.    DENOSUMAB (XGEVA) 120 MG/1.7ML SOLN INJECTION    Inject 120 mg into the skin every 3 (three) months.   DEXTROMETHORPHAN-GUAIFENESIN (MUCINEX DM PO)    Take by mouth 2 (two) times daily.   DICLOFENAC SODIUM (VOLTAREN) 1 % GEL    Apply 2 g topically 4 (four) times  daily.   DILTIAZEM (CARDIZEM SR) 60 MG 12 HR CAPSULE    Take 1 capsule (60 mg total) by mouth 2 (two) times daily.   DONEPEZIL (ARICEPT) 10 MG TABLET    Take 1 tablet by mouth once daily   FUROSEMIDE (LASIX) 20 MG TABLET    Take 0.5 tablets (10 mg total) by mouth daily. Take for 5 day, then change to as needed for shortness of breath for pleural effusion.   GLIPIZIDE (GLUCOTROL XL) 5 MG 24 HR TABLET    Take 1 tablet (5 mg total) by mouth daily with breakfast.   GLUCOSE BLOOD (EASYMAX TEST) TEST STRIP    Use as instructed   LANCETS 28G MISC    1 kit by Does not apply route every morning.   LISINOPRIL (ZESTRIL) 20 MG TABLET    Take 1 tablet (20 mg total) by mouth in the morning and at bedtime.   METFORMIN (GLUCOPHAGE) 850 MG TABLET    Take 1 tablet (850 mg total) by mouth 2 (two) times daily with a meal.   MIRTAZAPINE (REMERON) 15 MG TABLET    Take 1 tablet (15 mg total) by mouth at bedtime.   MULTIPLE VITAMIN (MULTIVITAMIN WITH MINERALS) TABS TABLET    Take 1 tablet by mouth daily.    OMEPRAZOLE (PRILOSEC) 40 MG CAPSULE    Take 1 capsule (40 mg total) by mouth daily.   PROBIOTIC PRODUCT (PROBIOTIC DAILY PO)    Take 1 tablet by mouth daily. Every morning    SIMETHICONE (MYLICON) 80 MG CHEWABLE TABLET    Chew 80 mg by mouth daily.  Modified Medications   No medications on file  Discontinued Medications   LEUPROLIDE ACETATE, 6 MONTH, (LUPRON DEPOT, 67-MONTH,) 45 MG INJECTION    Inject 45 mg into the muscle every 6 (six) months.   PREDNISONE (DELTASONE) 5 MG TABLET    Take 2.5 mg by mouth daily. To stop  after 4 weeks    Physical Exam:  Vitals:   07/05/20 1253  BP: 100/62  Pulse: (!) 55  Resp: 16  Temp: (!) 97.5 F (36.4 C)  SpO2: 92%  Weight: 134 lb 12.8 oz (61.1 kg)  Height: _0  (1.778 m)   Body mass index is 19.34 kg/m. Wt Readings from Last 3 Encounters:  07/05/20 134 lb 12.8 oz (61.1 kg)  05/14/20 129 lb (58.5 kg)  05/07/20 127 lb 6.4 oz (57.8 kg)    Physical Exam Constitutional:      General: He is not in acute distress.    Appearance: He is well-developed. He is not diaphoretic.     Comments: Frail thin male  HENT:     Head: Normocephalic and atraumatic.     Ears:     Comments: Narrow canal on right. No swelling or tenderness     Mouth/Throat:     Pharynx: No oropharyngeal exudate.  Eyes:     Conjunctiva/sclera: Conjunctivae normal.     Pupils: Pupils are equal, round, and reactive to light.  Cardiovascular:     Rate and Rhythm: Normal rate and regular rhythm.     Heart sounds: Normal heart sounds.  Pulmonary:     Effort: Pulmonary effort is normal.     Breath sounds: Normal breath sounds.  Abdominal:     General: Bowel sounds are normal.     Palpations: Abdomen is soft.  Musculoskeletal:        General: No tenderness.     Left forearm: Swelling (no heat, redness  or tenderness) present. No tenderness.     Cervical back: Normal range of motion and neck supple.  Skin:    General: Skin is warm and dry.  Neurological:     Mental Status: He is alert and oriented to person, place, and time. Mental status is at baseline.  Psychiatric:        Mood and Affect: Mood normal.        Behavior: Behavior normal.     Labs reviewed: Basic Metabolic Panel: Recent Labs    09/22/19 0327 09/23/19 0313 11/01/19 1239 03/25/20 1533 05/03/20 1018 07/03/20 1810  NA 140  --    < > 136 138 137  K 4.9  --    < > 4.4 4.5 4.5  CL 111  --    < > 102 102 105  CO2 21*  --    < > _0 GLUCOSE 182*  --    < > 167* 124* 151*  BUN 17  --    < > 19 30* 16   CREATININE 1.21  --    < > 1.01 1.12 0.92  CALCIUM 9.1  --    < > 9.2 10.4* 9.7  MG 1.0* 1.9  --   --   --   --    < > = values in this interval not displayed.   Liver Function Tests: Recent Labs    02/22/20 0956 03/25/20 1533 05/03/20 1018  AST 14* 12* 17  ALT _1 ALKPHOS 69 75 59  BILITOT 0.7 0.3 0.7  PROT 6.3* 6.2* 6.7  ALBUMIN 3.2* 2.9* 3.3*   No results for input(s): LIPASE, AMYLASE in the last 8760 hours. No results for input(s): AMMONIA in the last 8760 hours. CBC: Recent Labs    02/22/20 0956 05/03/20 1018 07/03/20 1810  WBC 8.3 7.2 9.7  NEUTROABS 3.7 3.8 5.5  HGB 9.5* 10.0* 11.0*  HCT 29.2* 31.9* 32.0*  MCV 93.6 93.0 87.0  PLT 268 215 280   Lipid Panel: No results for input(s): CHOL, HDL, LDLCALC, TRIG, CHOLHDL, LDLDIRECT in the last 8760 hours. TSH: No results for input(s): TSH in the last 8760 hours. A1C: Lab Results  Component Value Date   HGBA1C 5.1 09/06/2019   CT Soft Tissue Neck W Contrast  Result Date: 07/03/2020 CLINICAL DATA:  Initial evaluation for possible foreign body. EXAM: CT NECK WITH CONTRAST TECHNIQUE: Multidetector CT imaging of the neck was performed using the standard protocol following the bolus administration of intravenous contrast. CONTRAST:  32m OMNIPAQUE IOHEXOL 300 MG/ML  SOLN COMPARISON:  Prior radiograph from earlier the same day. FINDINGS: Pharynx and larynx: Oral cavity within normal limits without discrete mass or collection. No acute abnormality about the remaining dentition. Oropharynx and nasopharynx within normal limits. No retropharyngeal collection or swelling. Epiglottis normal. Vallecula clear. Remainder of the hypopharynx and supraglottic larynx within normal limits. Glottis normal. Subglottic airway patent and clear. Visualized upper esophagus is patulous and distended with internal air-fluid level. No visible radiopaque foreign body. Salivary glands: Subtle 11 mm ovoid lesion seen within the right parotid gland  (series 6, image 25). Parotid glands and submandibular glands are otherwise within normal limits. Thyroid: Unremarkable. Lymph nodes: No enlarged or pathologic adenopathy within the neck. Vascular: Normal intravascular enhancement seen throughout the neck. Moderate atheromatous change about the carotid bifurcations and carotid siphons. Limited intracranial: Unremarkable. Visualized orbits: Patient status post bilateral ocular lens replacement. Ocular senescent calcifications noted. Visualized globes and orbital soft tissues  demonstrate no other acute finding. Mastoids and visualized paranasal sinuses: Visualized paranasal sinuses are clear. Right mastoid and middle ear effusion partially visualized. Left mastoid air cells and middle ear cavity are clear. Skeleton: No acute osseous finding. No discrete or worrisome osseous lesions. Moderate cervical spondylosis noted at C5-6. Upper chest: Small layering left pleural effusion partially visualized. Visualized upper chest demonstrates no other acute finding. Partially visualized lungs are otherwise clear. Other: None. IMPRESSION: 1. No visible radiopaque foreign body identified within the neck. 2. Patulous and distended upper esophagus with internal air-fluid level. Appearance favors changes of esophageal dysmotility, although a possible distally obstructive process could also be considered, better assessed on concomitant CT of the chest. 3. 11 mm right parotid lesion, indeterminate. Outpatient ENT referral for further workup and consultation could be performed as clinically warranted. 4. Right mastoid and middle ear effusion, partially visualized. Correlation with physical exam recommended. 5. Small layering left pleural effusion, partially visualized. Electronically Signed   By: Jeannine Boga M.D.   On: 07/03/2020 21:29   CT Chest W Contrast  Result Date: 07/03/2020 CLINICAL DATA:  Cough. Possible pneumomediastinum on x-ray. Possible fluid in function.  Foreign body sensation in throat. EXAM: CT CHEST WITH CONTRAST TECHNIQUE: Multidetector CT imaging of the chest was performed during intravenous contrast administration. CONTRAST:  21m OMNIPAQUE IOHEXOL 300 MG/ML  SOLN COMPARISON:  Chest radiograph earlier today.  Chest CT 04/15/2017 FINDINGS: Cardiovascular: Aortic atherosclerosis and tortuosity. No dissection or acute aortic findings. Mild cardiomegaly. Occasional coronary artery calcifications. No filling defects in the central pulmonary arteries to the lobar level, exam not tailored to pulmonary artery evaluation. Mediastinum/Nodes: No pneumomediastinum. Radiographic appearance was due to gaseous distension of the upper esophagus. The esophagus is dilated, air-filled proximally, contains dependent intraluminal contents in the mid and distal aspect. Distal esophagus is tortuous and courses left laterally. Question of irregular esophageal wall thickening. No mediastinal or hilar adenopathy. No suspicious thyroid nodule. Lungs/Pleura: Small left pleural effusion. Slight ground-glass and reticulonodular airspace disease in the dependent right greater than left lower lobe. No debris within the trachea or central bronchi. Mild lower lobe bronchial thickening. No pulmonary mass. No pneumothorax. Upper Abdomen: Pancreatic calcifications and ductal dilatation typical of chronic pancreatitis. No peripancreatic fat stranding. Cholecystectomy. No acute findings in the upper abdomen. Musculoskeletal: Chronic T12 superior endplate irregularity with prominent Schmorl's nodes/mild compression fracture. Chronic regularity of upper sternal body, unchanged this is unchanged from prior exam. No acute osseous abnormalities are seen. IMPRESSION: 1. The esophagus is dilated, air-filled proximally, contains dependent intraluminal contents in the mid and distal aspect. Mild irregular distal esophageal wall thickening. Suspect chronic esophageal dysmotility, with patulous esophagus seen  on prior exam to a lesser extent. 2. Small left pleural effusion. Lower lobe bronchial thickening with mild ground-glass and reticulonodular airspace disease in the dependent right greater than left lower lobe is suspicious for aspiration. 3. Mild cardiomegaly.  Coronary artery calcifications. 4. Chronic pancreatitis. Aortic Atherosclerosis (ICD10-I70.0). Electronically Signed   By: MKeith RakeM.D.   On: 07/03/2020 20:58   DG Chest Port 1 View  Result Date: 07/03/2020 CLINICAL DATA:  Aspiration something stuck in throat EXAM: PORTABLE CHEST 1 VIEW COMPARISON:  04/16/2020 FINDINGS: No focal consolidation. Trace right effusion. Mild cardiomegaly. Aortic atherosclerosis. No pneumothorax. Possible air distension of the esophagus. IMPRESSION: 1. Trace right pleural effusion or thickening. No focal airspace disease. 2. Possible air distension of the esophagus. CT chest could be obtained if continued concern for esophageal foreign body. Electronically Signed  By: Donavan Foil M.D.   On: 07/03/2020 20:01     Assessment/Plan 1. Esophageal dysmotility -continues with increase mucous and trouble swallowing.  Followed by home health PT. GI referral has been placed and appt scheduled. Plans to crush medications- will consult pharmacy on what is safe to crush. May need adjustments on medication if can not crush.   2. right parotid lesion -noted on CT from ED - Ambulatory referral to ENT  3. Hearing loss of right ear, unspecified hearing loss type -TM hard to visualized on right. Will send to ENT for further evaluation due to effusion noted on CT, worsening hearing loss, and bloody discharged noted from family. - Ambulatory referral to ENT  4. Avulsion of fingernail, initial encounter Slight tenderness reported by pt, improved on exam but slight redness noted. No signs of infection or drainage. To use warm epsom salt soaks three times daily. Notify for increase in redness, tenderness, drainage or  swelling.   5. Moderate protein-calorie malnutrition ([place]) -encouraged increase in protein in diet. Pt is very specific about diet. Encouraged to use smoothies, boost breeze, etc.   6. Left lower arm swelling -suspect due to tight dressing that was applied. Encouraged elevation. No redness or tenderness noted.   Next appt: 3 months.  [name]. [place], [facility] [phone]

## 2020-07-07 ENCOUNTER — Other Ambulatory Visit: Payer: Self-pay | Admitting: Cardiology

## 2020-07-09 ENCOUNTER — Encounter (HOSPITAL_BASED_OUTPATIENT_CLINIC_OR_DEPARTMENT_OTHER): Payer: Medicare Other | Admitting: Internal Medicine

## 2020-07-09 ENCOUNTER — Other Ambulatory Visit: Payer: Self-pay

## 2020-07-09 DIAGNOSIS — S40812D Abrasion of left upper arm, subsequent encounter: Secondary | ICD-10-CM | POA: Diagnosis not present

## 2020-07-09 DIAGNOSIS — E1140 Type 2 diabetes mellitus with diabetic neuropathy, unspecified: Secondary | ICD-10-CM | POA: Diagnosis not present

## 2020-07-09 DIAGNOSIS — L97412 Non-pressure chronic ulcer of right heel and midfoot with fat layer exposed: Secondary | ICD-10-CM | POA: Diagnosis not present

## 2020-07-09 DIAGNOSIS — L97514 Non-pressure chronic ulcer of other part of right foot with necrosis of bone: Secondary | ICD-10-CM | POA: Diagnosis not present

## 2020-07-09 DIAGNOSIS — L97521 Non-pressure chronic ulcer of other part of left foot limited to breakdown of skin: Secondary | ICD-10-CM | POA: Diagnosis not present

## 2020-07-09 DIAGNOSIS — L89899 Pressure ulcer of other site, unspecified stage: Secondary | ICD-10-CM | POA: Diagnosis not present

## 2020-07-09 DIAGNOSIS — E11621 Type 2 diabetes mellitus with foot ulcer: Secondary | ICD-10-CM | POA: Diagnosis not present

## 2020-07-09 DIAGNOSIS — L98492 Non-pressure chronic ulcer of skin of other sites with fat layer exposed: Secondary | ICD-10-CM | POA: Diagnosis not present

## 2020-07-09 NOTE — Progress Notes (Signed)
CEBERT, DETTMANN ([id number]) Visit Report for 07/09/2020 HPI Details Patient Name: Date of Service: [name] 07/09/2020 1:45 PM Medical Record Number: [id number] Patient Account Number: [id number] Date of Birth/Sex: Treating RN: Nov 14, 1923 (87 y.o. Erie [name] Primary Care Provider: Sherrie Mustache Other Clinician: Referring Provider: Treating Provider/Extender: Deloria Lair in Treatment: 46 History of Present Illness HPI Description: ADMISSION 05/02/2019 This is a 87 year old man accompanied by his daughter. He lives in [place] with his elderly wife. He has home caregivers as well. Apparently on February 28, 2019 they noted blood on his sock and noticed a wound on the tip of his right great toe. He has been cared for by Dr. Posey Pronto who is his podiatrist in [place]. Apparently they have been using Betadine to this area. The patient has a thick mycotic nail on the right as well which is embedded in the wound in some areas. Fortunately the patient has diabetic neuropathy and is not really experiencing any pain otherwise I think this would all be quite painful. The patient was previously treated in 2019 for wounds on the left foot. This included a fairly thorough vascular review by vein and vascular Dr. Oneida Alar. His ABIs at that time were noncompressible bilaterally TBI on the right was 0.88 with biphasic waveforms on the right and monophasic waveforms at the left. He ended up having amputations of the left first and second toes. He did have an angiogram on 06/25/2017 which showed on the right the right common femoral profundofemoral for Morris and superficial femoral arteries were widely patent the right popliteal was widely patent. Tibial vessels were not well opacified secondary to motion artifact however he was felt to have all 3 tibial vessels intact with some calcification and some areas of stenosis within the posterior  tibial artery but relatively good flow to the right lower leg. He also had the left reviewed as well which was the source of the problem at that time. Past medical history; prostate cancer on oral antiandrogen therapy with bone mets, GI stromal tumor in 2005, type 2 diabetes with peripheral neuropathy, hypertension, atrial fibrillation on Eliquis, history of non-Hodgkin's lymphoma treated with CHOP in 2005. He had amputations of the left first and second toes secondary to osteomyelitis. Does not appear that he has had an x-ray of the right foot ABI in our clinic on the right was noncompressible [see full arterial discussion above]. 1/19; area on the tip of the right great toe under a very mycotic nail tip. We use silver alginate. 1/26; area of the tip of the right great toe under a very mycotic nail. A lot of this seems to have closed down we have been using silver alginate 2/9; the areas on the tip of the right great toe under a very mycotic nail. A lot of this continues to close down. I do not think there is a subungual problem. I am hopeful that this mycotic nail which is thick and raised will allow full epithelialization of the wound 2/23; this was a difficult area on the tip of his right great toe. Thick mycotic nail over the base of the wound. This appears to have closed down now. The nail itself is thick split with considerable subungual debris. READMISSION 07/25/2019 This is a 87 year old man that we cared for earlier this year in the clinic without a wound on the tip of his right great toe under a very mycotic toenail. He is a type II diabetic. His  ABIs have been noncompressible however he has been felt to have adequate blood flow in the right to heal the wound. We were able to get this wound to close over as I understand things he followed with Dr. Caprice Beaver had some debridement of the nail everything looks fine and apparently the wound bed was washed off by his wife over the weekend and by  Monday it was open. His daughter is able to show me pictures. Unfortunately this now has exposed bone raising the possibility that he has had underlying osteomyelitis all along. He has been using Silvadene cream on this he has been referred back to the clinic by Dr. Caprice Beaver. He had x-rays of the right foot that did not show evidence of osteomyelitis in the right foot. He has not been on antibiotics He also had an x-ray of the left foot that showed findings suspicious for osteomyelitis involving the residual base of the first proximal phalanx and the proximal phalanx of the left second toe as well as the left second metatarsal head however he has no wounds in this area. 4/12; culture from last time of bone in the right great toe was negative. The patient's wife apparently scrubbed this toe vigorously and according to his daughter reopen the wound however unfortunately it is all exposed bone last time. I removed some of this and we use Prisma unfortunately the area does not look much better today. Previous x-rays done in podiatry office Dr. Caprice Beaver did not show evidence of osteomyelitis. Nevertheless I think osteomyelitis here is probably quite likely. Still using Prisma 4/26; he took 2 weeks of doxycycline although his daughter is telling me he is not eating. He has loose bowel movements however I did not get the sense that this is changed all that much. He still has exposed bone. Culture I did last time showed a few coag negative staph I do not think that this is necessarily relevant. I still am operating under the premise that the patient probably has osteomyelitis. After considerable difficulty we are able to determine he is not allergic to cephalosporins I therefore gave him Keflex. 5/10; is tolerating Keflex 500 3 times daily quite well. He still has the small wound in the tip of his toe with exposed bone.Marland Kitchen Unfortunately he does not really have any granulation tissue. He saw Dr. Caprice Beaver of  podiatry in [place] last week I have not had a chance to look at his notes The patient had an extensive vascular work-up including an angiogram in 2019. He was felt to have adequate blood supply on the right at the time although he did have amputations of the left first and second toe by Dr. Oneida Alar. 09/11/19 -Patient is back and is about to finish his Keflex, appointment with Dr. Oneida Alar is in the works. The right great toe wound looks about the same, the left third toe dorsal wound has healed. There was a small eschar covering it that was removed by podiatry.According to patient's daughter the right great toe base on the plantar surface had some redness that seems to have disappeared. 10/13/2019; patient is back in clinic today after 1 month hiatus although I have not seen him since 5/10. Since he was last here he was admitted to hospital from 6/2 through 6/5 because of a nonhealing wound on his right great toe. He underwent an angiogram which showed patent common femoral, profunda, SFA above and below-knee popliteal artery. The dominant runoff in the right lower extremity was through the anterior tibial  but that became very small at the level of the ankle and had minimal outflow into the foot. The peroneal artery was patent but atretic. 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 Interventions were attempted on the posterior tibial. Mechanical orbital atherectomy down to the level of the ankle and then this was angioplastied with 2.3 mm and 3 mm balloons. Flow remained very sluggish down the posterior tibial artery but was patent. Post event there apparently was embolization of an area involving the plantar left heel and sides of the left heel. He arrives back in clinic today it has been almost 7 weeks since I have seen him. Miraculously the great toe area with open bone is  closed over probably as result of Dr. Ainsley Spinner interventions. He does however have an extensive ischemic area on the plantar and medial aspect of his heel. I reviewed Dr. Ainsley Spinner last note from 3 days ago on 6/22. He did not think there was enough inflow to heal the heel wound. With eschar and sloughing. Wondered whether he could heal a below-knee amputation but more likely an above the knee amputation when required. He was placed on Xeroform to the right heel In discussing things with the patient's family and the patient present he is not in a lot of pain. The area does not appear to be grossly infected. The patient appears to be comfortable and conversational. He is eating and drinking fairly well. 7/9; the patient saw Dr. Carlis Abbott on 6/29. He had previously done heroic attempt to establish blood flow as described above. 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 Although the eschar on the heel looks ominous both plantar and medial it is certainly no worse than last week. We will continue Betadine to this area with alginate. Ultimately this will separate off and we will see about the viability of the tissue underneath. Also in clinic today our nurses were washing his right great toe tip apparently some of the skin came off eschared and there is an open wound at the tip of the great toe with exposed bone similar to what he had previously 7/23; he has the open area on the tip of his first toe which I think is mostly bone. Then he has the embolic area on the plantar heel extending into the medial heel. We have been using Betadine and silver alginate to the heel silver alginate to the toe. His daughter brings up that she was given nitroglycerin patches by Dr. Donzetta Matters to try and open blood flow to the foot. She has not been using them out of concern of systemic hypotension and also that the labels said not  to put them on extremities 8/10-Returns at 3 weeks, open area on the tip of his first toe that is dried out, the heel with eschar, applying Betadine to the eschar area and silver alginate dressing. 9/7; 1 month follow-up. He has an open area on the tip of the toe almost subungual with a thick mycotic nail. The heel wound has black eschar but this is beginning to separate. We have been using Betadine and silver alginate on the heel and simply silver alginate on the right great toe 9/23; the patient was worked in urgently at the request of his wife was concerned about infection in the tip of the  toe. Apparently this was brought up by home health. 10/14; the patient has the area of exposed bone on the tip of the right great toe. He also has the eschared area on the plantar heel which was a embolic phenomenon at the time of attempted revascularization. He also had an area of denuded epithelium over both buttocks which I think is probably a combination of pressure friction and moisture. They have been using zinc oxide which is appropriate 10/26. The patient has an area of wider exposed bone at the tip of the right great toe. The substantial eschared area on the heel was already 50% separated I remove this. Actually the patient's wife had called repetitively for urgent appointments with some degree of erythema in the foot as the primary concern. It turns out that she has been to see her primary doctor after the patient had a fall off the commode resulting in skin tears to both forearms. He was placed on doxycycline because of concern of cellulitis of the toe. She is also been to see [facility] in [place] with regards to the toe. Apparently he shaved the bone and recommended Betadine to this. Noteworthy that the patient already had an extensive arterial evaluation with an angiogram. He has known PAD with tibial vessel disease. The last effort was an attempt to open up the posterior tibial  artery which resulted in symptoms of embolization to the heel. 11/9; since the patient was last here he was brought to the ER on 11/4. He was felt to have cellulitis in the right great toe. They did not do a wound culture however they did blood cultures which showed 3 out of 3 Staph hominis. He has been treated with clindamycin 150 mg 3 times times a day. His daughter says he went to the hospital with altered LOC. He had been running a fever but was not febrile on arrival to the ER. Looking over the emergency room notes he had erythema in his foot and I think he had cellulitis. He had apparently been on doxycycline 100 mg twice daily prescribed by Dr. Caprice Beaver prior to this. The staph isolate itself is very resistant it is sensitive to vancomycin but resistant to methicillin's, quinolones, tetracycline and Septra. During the stay in the ER she was seen by vein and vascular they noted that he does not have any further options for revascularization. They do not think that he could undergo any foot conserving surgery therefore the only thing left here would be a BKA The area of the heel that I debrided last time he is here actually looks quite healthy nice healthy looking granulation tissue over perhaps 60 to 70% of the wound still 30% slough we have been using Iodoflex. They have been using silver collagen to the toe The other issue here is the daughter is struggling with the idea of in-home hospice. I went over this with her. I emphasized that hospice is not a home care agency as a velocity of care at the end of life. Patient is 87 years old he apparently is eating and drinking less and since I have known him he has become more frail 03/11/2020 upon evaluation today patient unfortunately is showing signs of progression in regard to his right great toe. Currently he is actually experiencing increased evidence of skin deterioration and in fact this toe is developing into more of a dry gangrene issue at  this point. He was signed up for hospice on Thursday and then apparently hospice was  called off on Friday due to the fact that they were talking about doing wet-to-dry dressings to both wound locations and his daughter was really concerned about this. She did not want anything to make the wounds progress faster than they should be. With that being said I think a wet-to-dry dressing would be okay for the heel location. I do not believe this would cause any harm. With that being said and in regard to the toe I really do agree I would not really want a wet-to-dry dressing here I think more of like Betadine and dry gauze dressing could be a appropriate way to go after the toe was painted with Betadine that is. 12/9. It has been a month since I saw this man. He was using Betadine to the great toe and Iodoflex to the area on the heel. The heel actually look quite good last time I saw this this is gotten larger and not nearly as healthy looking as last time. Moreover the right first toe wound now has dry gangrene Since they were last here they have not elected to sign up for hospice. They do not have home health. They are trying to get some [facility] care [facility] the [facility] 1/13; the patient's right first toe is mummified to the inter phalangeal joint. The area on the plantar aspect of his right heel actually measures quite a bit better. We have been using Iodoflex His daughter points out an area on the plantar aspect of the left 5th toe at roughly the level of the proximal inner phalangeal 2/10; right first toe remains mummified there is no change in this no evidence of infection no erythema spreading proximally into the foot He still has the area on the plantar aspect of his right heel. This requires a debridement. We have been using silver alginate He had an area last time at the base of his left fifth toe. This is healed however he has a new area on the medial part of the left fifth toe. This also  required debridement. We have been using silver alginate largely to all wound areas 06/27/2020 upon evaluation today patient continues to hang onto the great toe. This does seem to be fairly stable and dry although the patient's daughter notes there has been a little bit of drainage here. Fortunately there is no signs of active infection at this time. No fevers, chills, nausea, vomiting, or diarrhea. Overall very pleased with the fact that the patient seems to be hanging on and doing quite well. The heel is healing quite nicely. With that being said I think the toe eventually will self amputate. 3/22; this is a patient that I have not seen in a while although he was seen here 10 days ago. His daughter called to work him in today due to erythema around the mom mummified great toe concern for cellulitis. He also comes in today with complaints of a skin tear on the left forearm possibly an early paronychia a on the left fourth finger Electronic Signature(s) Signed: 07/09/2020 5:50:43 PM By: Linton Ham MD Entered By: Linton Ham on 07/09/2020 16:52:08 -------------------------------------------------------------------------------- Physical Exam Details Patient Name: Date of Service: [name] 07/09/2020 1:45 PM Medical Record Number: [id number] Patient Account Number: [id number] Date of Birth/Sex: Treating RN: [birth year] (87 y.o. Erie [name] Primary Care Provider: Sherrie Mustache Other Clinician: Referring Provider: Treating Provider/Extender: Deloria Lair in Treatment: 76 Constitutional Sitting or standing Blood Pressure is within target range for patient.Marland Kitchen  Pulse regular and within target range for patient.Marland Kitchen Respirations regular, non-labored and within target range.. Temperature is normal and within the target range for the patient.Marland Kitchen Appears in no distress. Respiratory work of breathing is normal. Cardiovascular Pedal pulses absent  bilaterally.. Notes Wound exam; indeed the patient has the mummified first toe however the eschar is separating. He does have erythema extending from the toe into the plantar first metatarsal head. The right heel wound is really just about closed small area with eschar He also showed me a small skin tear on the left forearm and possibly the beginnings of a paronychia in the dorsal left fourth finger Electronic Signature(s) Signed: 07/09/2020 5:50:43 PM By: Linton Ham MD Entered By: Linton Ham on 07/09/2020 16:54:30 -------------------------------------------------------------------------------- Physician Orders Details Patient Name: Date of Service: [name] 07/09/2020 1:45 PM Medical Record Number: [id number] Patient Account Number: [id number] Date of Birth/Sex: Treating RN: 1923-05-10 (87 y.o. Erie [name] Primary Care Provider: Sherrie Mustache Other Clinician: Referring Provider: Treating Provider/Extender: Deloria Lair in Treatment: 31 Verbal / Phone Orders: No Diagnosis Coding Follow-up Appointments Return Appointment in 1 week. Bathing/ Shower/ Hygiene May shower and wash wound with soap and water. - with dressing changes Edema Control - Lymphedema / SCD / Other Elevate legs to the level of the heart or above for 30 minutes daily and/or when sitting, a frequency of: Avoid standing for long periods of time. Moisturize legs daily. Additional Orders / Instructions Other: - Apply Desitin Zinc oxide redness, excoriated areas to buttock area daily and as needed for protection. Home Health New wound care orders this week; continue Home Health for wound care. May utilize formulary equivalent dressing for wound treatment orders unless otherwise specified. - Advance home health to monitor weekly of right great toe. Wound Treatment Wound #2 - T Great oe Wound Laterality: Right Cleanser: Soap and Water (Home Health) 1 x Per Day/30  Days Discharge Instructions: May shower and wash wound with dial antibacterial soap and water prior to dressing change. Prim Dressing: betadine (Home Health) 1 x Per Day/30 Days ary Secondary Dressing: Woven Gauze Sponge, Non-Sterile 4x4 in (Home Health) 1 x Per Day/30 Days Discharge Instructions: Apply over primary dressing as directed. Secured With: [employer], 4.5x3.1 (in/yd) (Generic) 1 x Per Day/30 Days Discharge Instructions: Secure with Kerlix as directed. Secured With: Paper Tape, 2x10 (in/yd) (Generic) 1 x Per Day/30 Days Discharge Instructions: Secure dressing with tape as directed. Wound #3 - Calcaneus Wound Laterality: Right Cleanser: Soap and Water (Home Health) 1 x Per Day/30 Days Discharge Instructions: May shower and wash wound with dial antibacterial soap and water prior to dressing change. Prim Dressing: apply betadine ary 1 x Per Day/30 Days Secondary Dressing: ALLEVYN Heel 4 1/2in x 5 1/2in / 10.5cm x 13.5cm 1 x Per Day/30 Days Discharge Instructions: Apply over primary dressing as directed. Secured With: [employer], 4.5x3.1 (in/yd) (Generic) 1 x Per Day/30 Days Discharge Instructions: Secure with Kerlix as directed. Secured With: Paper Tape, 2x10 (in/yd) (Generic) 1 x Per Day/30 Days Discharge Instructions: Secure dressing with tape as directed. Wound #7 - Forearm Wound Laterality: Left Cleanser: Soap and Water (Home Health) 1 x Per Day/30 Days Discharge Instructions: May shower and wash wound with dial antibacterial soap and water prior to dressing change. Secondary Dressing: Woven Gauze Sponge, Non-Sterile 4x4 in (Home Health) 1 x Per Day/30 Days Discharge Instructions: Apply over primary dressing as directed. Secured With: [employer], 4.5x3.1 (in/yd) (Generic)  1 x Per Day/30 Days Discharge Instructions: Secure with Kerlix as directed. Secured With: Paper Tape, 2x10 (in/yd) (Generic) 1 x Per Day/30 Days Discharge Instructions: Secure  dressing with tape as directed. Electronic Signature(s) Signed: 07/09/2020 5:23:06 PM By: Rhae Hammock RN Signed: 07/09/2020 5:50:43 PM By: Linton Ham MD Previous Signature: 07/09/2020 3:32:12 PM Version By: Linton Ham MD Entered By: Rhae Hammock on 07/09/2020 15:34:24 -------------------------------------------------------------------------------- Problem List Details Patient Name: Date of Service: [name] 07/09/2020 1:45 PM Medical Record Number: [id number] Patient Account Number: [id number] Date of Birth/Sex: Treating RN: 12/22/1923 (87 y.o. Burnadette Pop, Lauren Primary Care Provider: Sherrie Mustache Other Clinician: Referring Provider: Treating Provider/Extender: Deloria Lair in Treatment: 39 Active Problems ICD-10 Encounter Code Description Active Date MDM Diagnosis E11.621 Type 2 diabetes mellitus with foot ulcer 07/25/2019 No Yes L97.514 Non-pressure chronic ulcer of other part of right foot with necrosis of bone 07/25/2019 No Yes E11.40 Type 2 diabetes mellitus with diabetic neuropathy, unspecified 07/25/2019 No Yes L97.412 Non-pressure chronic ulcer of right heel and midfoot with fat layer exposed 02/13/2020 No Yes S40.812D Abrasion of left upper arm, subsequent encounter 02/13/2020 No Yes Inactive Problems ICD-10 Code Description Active Date Inactive Date S40.811D Abrasion of right upper arm, subsequent encounter 02/13/2020 02/13/2020 Resolved Problems Electronic Signature(s) Signed: 07/09/2020 5:50:43 PM By: Linton Ham MD Entered By: Linton Ham on 07/09/2020 16:50:55 -------------------------------------------------------------------------------- Progress Note Details Patient Name: Date of Service: [name] 07/09/2020 1:45 PM Medical Record Number: [id number] Patient Account Number: [id number] Date of Birth/Sex: Treating RN: [birth year] (87 y.o. Erie [name] Primary Care Provider: Sherrie Mustache Other Clinician: Referring Provider: Treating Provider/Extender: Deloria Lair in Treatment: 50 Subjective History of Present Illness (HPI) ADMISSION 05/02/2019 This is a 87 year old man accompanied by his daughter. He lives in [place] with his elderly wife. He has home caregivers as well. Apparently on February 28, 2019 they noted blood on his sock and noticed a wound on the tip of his right great toe. He has been cared for by Dr. Posey Pronto who is his podiatrist in [place]. Apparently they have been using Betadine to this area. The patient has a thick mycotic nail on the right as well which is embedded in the wound in some areas. Fortunately the patient has diabetic neuropathy and is not really experiencing any pain otherwise I think this would all be quite painful. The patient was previously treated in 2019 for wounds on the left foot. This included a fairly thorough vascular review by vein and vascular Dr. Oneida Alar. His ABIs at that time were noncompressible bilaterally TBI on the right was 0.88 with biphasic waveforms on the right and monophasic waveforms at the left. He ended up having amputations of the left first and second toes. He did have an angiogram on 06/25/2017 which showed on the right the right common femoral profundofemoral for Morris and superficial femoral arteries were widely patent the right popliteal was widely patent. Tibial vessels were not well opacified secondary to motion artifact however he was felt to have all 3 tibial vessels intact with some calcification and some areas of stenosis within the posterior tibial artery but relatively good flow to the right lower leg. He also had the left reviewed as well which was the source of the problem at that time. Past medical history; prostate cancer on oral antiandrogen therapy with bone mets, GI stromal tumor in 2005, type 2 diabetes with peripheral neuropathy, hypertension, atrial  fibrillation  on Eliquis, history of non-Hodgkin's lymphoma treated with CHOP in 2005. He had amputations of the left first and second toes secondary to osteomyelitis. Does not appear that he has had an x-ray of the right foot ABI in our clinic on the right was noncompressible [see full arterial discussion above]. 1/19; area on the tip of the right great toe under a very mycotic nail tip. We use silver alginate. 1/26; area of the tip of the right great toe under a very mycotic nail. A lot of this seems to have closed down we have been using silver alginate 2/9; the areas on the tip of the right great toe under a very mycotic nail. A lot of this continues to close down. I do not think there is a subungual problem. I am hopeful that this mycotic nail which is thick and raised will allow full epithelialization of the wound 2/23; this was a difficult area on the tip of his right great toe. Thick mycotic nail over the base of the wound. This appears to have closed down now. The nail itself is thick split with considerable subungual debris. READMISSION 07/25/2019 This is a 87 year old man that we cared for earlier this year in the clinic without a wound on the tip of his right great toe under a very mycotic toenail. He is a type II diabetic. His ABIs have been noncompressible however he has been felt to have adequate blood flow in the right to heal the wound. We were able to get this wound to close over as I understand things he followed with Dr. Caprice Beaver had some debridement of the nail everything looks fine and apparently the wound bed was washed off by his wife over the weekend and by Monday it was open. His daughter is able to show me pictures. Unfortunately this now has exposed bone raising the possibility that he has had underlying osteomyelitis all along. He has been using Silvadene cream on this he has been referred back to the clinic by Dr. Caprice Beaver. He had x-rays of the right foot that did not show  evidence of osteomyelitis in the right foot. He has not been on antibiotics He also had an x-ray of the left foot that showed findings suspicious for osteomyelitis involving the residual base of the first proximal phalanx and the proximal phalanx of the left second toe as well as the left second metatarsal head however he has no wounds in this area. 4/12; culture from last time of bone in the right great toe was negative. The patient's wife apparently scrubbed this toe vigorously and according to his daughter reopen the wound however unfortunately it is all exposed bone last time. I removed some of this and we use Prisma unfortunately the area does not look much better today. Previous x-rays done in podiatry office Dr. Caprice Beaver did not show evidence of osteomyelitis. Nevertheless I think osteomyelitis here is probably quite likely. Still using Prisma 4/26; he took 2 weeks of doxycycline although his daughter is telling me he is not eating. He has loose bowel movements however I did not get the sense that this is changed all that much. He still has exposed bone. Culture I did last time showed a few coag negative staph I do not think that this is necessarily relevant. I still am operating under the premise that the patient probably has osteomyelitis. After considerable difficulty we are able to determine he is not allergic to cephalosporins I therefore gave him Keflex. 5/10; is tolerating Keflex  500 3 times daily quite well. He still has the small wound in the tip of his toe with exposed bone.Marland Kitchen Unfortunately he does not really have any granulation tissue. He saw Dr. Caprice Beaver of podiatry in [place] last week I have not had a chance to look at his notes The patient had an extensive vascular work-up including an angiogram in 2019. He was felt to have adequate blood supply on the right at the time although he did have amputations of the left first and second toe by Dr. Oneida Alar. 09/11/19 -Patient is back  and is about to finish his Keflex, appointment with Dr. Oneida Alar is in the works. The right great toe wound looks about the same, the left third toe dorsal wound has healed. There was a small eschar covering it that was removed by podiatry.According to patient's daughter the right great toe base on the plantar surface had some redness that seems to have disappeared. 10/13/2019; patient is back in clinic today after 1 month hiatus although I have not seen him since 5/10. Since he was last here he was admitted to hospital from 6/2 through 6/5 because of a nonhealing wound on his right great toe. He underwent an angiogram which showed patent common femoral, profunda, SFA above and below-knee popliteal artery. The dominant runoff in the right lower extremity was through the anterior tibial but that became very small at the level of the ankle and had minimal outflow into the foot. The peroneal artery was patent but atretic. 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 Interventions were attempted on the posterior tibial. Mechanical orbital atherectomy down to the level of the ankle and then this was angioplastied with 2.3 mm and 3 mm balloons. Flow remained very sluggish down the posterior tibial artery but was patent. Post event there apparently was embolization of an area involving the plantar left heel and sides of the left heel. He arrives back in clinic today it has been almost 7 weeks since I have seen him. Miraculously the great toe area with open bone is closed over probably as result of Dr. Ainsley Spinner interventions. He does however have an extensive ischemic area on the plantar and medial aspect of his heel. I reviewed Dr. Ainsley Spinner last note from 3 days ago on 6/22. He did not think there was enough inflow to heal the heel wound. With eschar and sloughing. Wondered whether he could  heal a below-knee amputation but more likely an above the knee amputation when required. He was placed on Xeroform to the right heel In discussing things with the patient's family and the patient present he is not in a lot of pain. The area does not appear to be grossly infected. The patient appears to be comfortable and conversational. He is eating and drinking fairly well. 7/9; the patient saw Dr. Carlis Abbott on 6/29. He had previously done heroic attempt to establish blood flow as described above. 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 Although the eschar on the heel looks ominous both plantar and medial it is certainly no worse than last week. We will continue Betadine to this area with alginate. Ultimately this will separate off and we will see about the viability of the tissue underneath. Also in clinic today our nurses were washing his right great toe tip apparently  some of the skin came off eschared and there is an open wound at the tip of the great toe with exposed bone similar to what he had previously 7/23; he has the open area on the tip of his first toe which I think is mostly bone. Then he has the embolic area on the plantar heel extending into the medial heel. We have been using Betadine and silver alginate to the heel silver alginate to the toe. His daughter brings up that she was given nitroglycerin patches by Dr. Donzetta Matters to try and open blood flow to the foot. She has not been using them out of concern of systemic hypotension and also that the labels said not to put them on extremities 8/10-Returns at 3 weeks, open area on the tip of his first toe that is dried out, the heel with eschar, applying Betadine to the eschar area and silver alginate dressing. 9/7; 1 month follow-up. He has an open area on the tip of the toe almost subungual with a thick mycotic nail. The heel wound has  black eschar but this is beginning to separate. We have been using Betadine and silver alginate on the heel and simply silver alginate on the right great toe 9/23; the patient was worked in urgently at the request of his wife was concerned about infection in the tip of the toe. Apparently this was brought up by home health. 10/14; the patient has the area of exposed bone on the tip of the right great toe. He also has the eschared area on the plantar heel which was a embolic phenomenon at the time of attempted revascularization. He also had an area of denuded epithelium over both buttocks which I think is probably a combination of pressure friction and moisture. They have been using zinc oxide which is appropriate 10/26. The patient has an area of wider exposed bone at the tip of the right great toe. The substantial eschared area on the heel was already 50% separated I remove this. Actually the patient's wife had called repetitively for urgent appointments with some degree of erythema in the foot as the primary concern. It turns out that she has been to see her primary doctor after the patient had a fall off the commode resulting in skin tears to both forearms. He was placed on doxycycline because of concern of cellulitis of the toe. She is also been to see [facility] in [place] with regards to the toe. Apparently he shaved the bone and recommended Betadine to this. Noteworthy that the patient already had an extensive arterial evaluation with an angiogram. He has known PAD with tibial vessel disease. The last effort was an attempt to open up the posterior tibial artery which resulted in symptoms of embolization to the heel. 11/9; since the patient was last here he was brought to the ER on 11/4. He was felt to have cellulitis in the right great toe. They did not do a wound culture however they did blood cultures which showed 3 out of 3 Staph hominis. He has been treated with clindamycin  150 mg 3 times times a day. His daughter says he went to the hospital with altered LOC. He had been running a fever but was not febrile on arrival to the ER. Looking over the emergency room notes he had erythema in his foot and I think he had cellulitis. He had apparently been on doxycycline 100 mg twice daily prescribed by Dr. Caprice Beaver prior to this. The staph isolate itself  is very resistant it is sensitive to vancomycin but resistant to methicillin's, quinolones, tetracycline and Septra. During the stay in the ER she was seen by vein and vascular they noted that he does not have any further options for revascularization. They do not think that he could undergo any foot conserving surgery therefore the only thing left here would be a BKA The area of the heel that I debrided last time he is here actually looks quite healthy nice healthy looking granulation tissue over perhaps 60 to 70% of the wound still 30% slough we have been using Iodoflex. They have been using silver collagen to the toe The other issue here is the daughter is struggling with the idea of in-home hospice. I went over this with her. I emphasized that hospice is not a home care agency as a velocity of care at the end of life. Patient is 87 years old he apparently is eating and drinking less and since I have known him he has become more frail 03/11/2020 upon evaluation today patient unfortunately is showing signs of progression in regard to his right great toe. Currently he is actually experiencing increased evidence of skin deterioration and in fact this toe is developing into more of a dry gangrene issue at this point. He was signed up for hospice on Thursday and then apparently hospice was called off on Friday due to the fact that they were talking about doing wet-to-dry dressings to both wound locations and his daughter was really concerned about this. She did not want anything to make the wounds progress faster than they should  be. With that being said I think a wet-to-dry dressing would be okay for the heel location. I do not believe this would cause any harm. With that being said and in regard to the toe I really do agree I would not really want a wet-to-dry dressing here I think more of like Betadine and dry gauze dressing could be a appropriate way to go after the toe was painted with Betadine that is. 12/9. It has been a month since I saw this man. He was using Betadine to the great toe and Iodoflex to the area on the heel. The heel actually look quite good last time I saw this this is gotten larger and not nearly as healthy looking as last time. Moreover the right first toe wound now has dry gangrene Since they were last here they have not elected to sign up for hospice. They do not have home health. They are trying to get some home care through the [facility] 1/13; the patient's right first toe is mummified to the inter phalangeal joint. The area on the plantar aspect of his right heel actually measures quite a bit better. We have been using Iodoflex His daughter points out an area on the plantar aspect of the left 5th toe at roughly the level of the proximal inner phalangeal 2/10; right first toe remains mummified there is no change in this no evidence of infection no erythema spreading proximally into the foot ooHe still has the area on the plantar aspect of his right heel. This requires a debridement. We have been using silver alginate ooHe had an area last time at the base of his left fifth toe. This is healed however he has a new area on the medial part of the left fifth toe. This also required debridement. We have been using silver alginate largely to all wound areas 06/27/2020 upon evaluation today patient continues to  hang onto the great toe. This does seem to be fairly stable and dry although the patient's daughter notes there has been a little bit of drainage here. Fortunately there is no signs of active infection  at this time. No fevers, chills, nausea, vomiting, or diarrhea. Overall very pleased with the fact that the patient seems to be hanging on and doing quite well. The heel is healing quite nicely. With that being said I think the toe eventually will self amputate. 3/22; this is a patient that I have not seen in a while although he was seen here 10 days ago. His daughter called to work him in today due to erythema around the mom mummified great toe concern for cellulitis. He also comes in today with complaints of a skin tear on the left forearm possibly an early paronychia a on the left fourth finger Objective Constitutional Sitting or standing Blood Pressure is within target range for patient.. Pulse regular and within target range for patient.Marland Kitchen Respirations regular, non-labored and within target range.. Temperature is normal and within the target range for the patient.Marland Kitchen Appears in no distress. Vitals Time Taken: 2:15 PM, Height: 70 in, Weight: 140 lbs, BMI: 20.1, Temperature: 98.0 F, Pulse: 72 bpm, Respiratory Rate: 17 breaths/min, Blood Pressure: 122/59 mmHg. Respiratory work of breathing is normal. Cardiovascular Pedal pulses absent bilaterally.. General Notes: Wound exam; indeed the patient has the mummified first toe however the eschar is separating. He does have erythema extending from the toe into the plantar first metatarsal head. ooThe right heel wound is really just about closed small area with eschar ooHe also showed me a small skin tear on the left forearm and possibly the beginnings of a paronychia in the dorsal left fourth finger Integumentary (Hair, Skin) Wound #2 status is Open. Original cause of wound was Gradually Appeared. The date acquired was: 06/15/2019. The wound has been in treatment 50 weeks. The wound is located on the Right T Great. The wound measures 4cm length x 8cm width x 0.1cm depth; 25.133cm^2 area and 2.513cm^3 volume. There is oe no tunneling or undermining  noted. There is a none present amount of drainage noted. The wound margin is indistinct and nonvisible. There is no granulation within the wound bed. There is a large (67-100%) amount of necrotic tissue within the wound bed including Eschar. Wound #3 status is Open. Original cause of wound was Gradually Appeared. The date acquired was: 09/21/2019. The wound has been in treatment 38 weeks. The wound is located on the Right Calcaneus. The wound measures 0.1cm length x 0.1cm width x 0.1cm depth; 0.008cm^2 area and 0.001cm^3 volume. There is no tunneling or undermining noted. There is a small amount of serosanguineous drainage noted. The wound margin is flat and intact. There is no granulation within the wound bed. There is no necrotic tissue within the wound bed. General Notes: calloused Wound #6 status is Healed - Epithelialized. Original cause of wound was Pressure Injury. The date acquired was: 05/01/2020. The wound has been in treatment 9 weeks. The wound is located on the Left T Fifth. The wound measures 0cm length x 0cm width x 0cm depth; 0cm^2 area and 0cm^3 volume. There is no oe tunneling or undermining noted. There is a none present amount of drainage noted. There is no granulation within the wound bed. There is no necrotic tissue within the wound bed. Wound #7 status is Open. Original cause of wound was Skin T ear/Laceration. The date acquired was: 07/05/2020. The wound is located  on the Left Forearm. The wound measures 1.7cm length x 0.4cm width x 0.1cm depth; 0.534cm^2 area and 0.053cm^3 volume. There is Fat Layer (Subcutaneous Tissue) exposed. There is no tunneling or undermining noted. There is a small amount of serosanguineous drainage noted. The wound margin is flat and intact. There is large (67- 100%) red granulation within the wound bed. There is no necrotic tissue within the wound bed. Assessment Active Problems ICD-10 Type 2 diabetes mellitus with foot ulcer Non-pressure chronic  ulcer of other part of right foot with necrosis of bone Type 2 diabetes mellitus with diabetic neuropathy, unspecified Non-pressure chronic ulcer of right heel and midfoot with fat layer exposed Abrasion of left upper arm, subsequent encounter Plan Follow-up Appointments: Return Appointment in 1 week. Bathing/ Shower/ Hygiene: May shower and wash wound with soap and water. - with dressing changes Edema Control - Lymphedema / SCD / Other: Elevate legs to the level of the heart or above for 30 minutes daily and/or when sitting, a frequency of: Avoid standing for long periods of time. Moisturize legs daily. Additional Orders / Instructions: Other: - Apply Desitin Zinc oxide redness, excoriated areas to buttock area daily and as needed for protection. Home Health: New wound care orders this week; continue Home Health for wound care. May utilize formulary equivalent dressing for wound treatment orders unless otherwise specified. - Advance home health to monitor weekly of right great toe. WOUND #2: - T Great Wound Laterality: Right oe Cleanser: Soap and Water (Home Health) 1 x Per Day/30 Days Discharge Instructions: May shower and wash wound with dial antibacterial soap and water prior to dressing change. Prim Dressing: betadine (Home Health) 1 x Per Day/30 Days ary Secondary Dressing: Woven Gauze Sponge, Non-Sterile 4x4 in (Home Health) 1 x Per Day/30 Days Discharge Instructions: Apply over primary dressing as directed. Secured With: [employer], 4.5x3.1 (in/yd) (Generic) 1 x Per Day/30 Days Discharge Instructions: Secure with Kerlix as directed. Secured With: Paper T ape, 2x10 (in/yd) (Generic) 1 x Per Day/30 Days Discharge Instructions: Secure dressing with tape as directed. WOUND #3: - Calcaneus Wound Laterality: Right Cleanser: Soap and Water (Home Health) 1 x Per Day/30 Days Discharge Instructions: May shower and wash wound with dial antibacterial soap and water prior to  dressing change. Prim Dressing: apply betadine 1 x Per Day/30 Days ary Secondary Dressing: ALLEVYN Heel 4 1/2in x 5 1/2in / 10.5cm x 13.5cm 1 x Per Day/30 Days Discharge Instructions: Apply over primary dressing as directed. Secured With: [employer], 4.5x3.1 (in/yd) (Generic) 1 x Per Day/30 Days Discharge Instructions: Secure with Kerlix as directed. Secured With: Paper T ape, 2x10 (in/yd) (Generic) 1 x Per Day/30 Days Discharge Instructions: Secure dressing with tape as directed. WOUND #7: - Forearm Wound Laterality: Left Cleanser: Soap and Water (Home Health) 1 x Per Day/30 Days Discharge Instructions: May shower and wash wound with dial antibacterial soap and water prior to dressing change. Secondary Dressing: Woven Gauze Sponge, Non-Sterile 4x4 in (Home Health) 1 x Per Day/30 Days Discharge Instructions: Apply over primary dressing as directed. Secured With: [employer], 4.5x3.1 (in/yd) (Generic) 1 x Per Day/30 Days Discharge Instructions: Secure with Kerlix as directed. Secured With: Paper T ape, 2x10 (in/yd) (Generic) 1 x Per Day/30 Days Discharge Instructions: Secure dressing with tape as directed. 1. I prescribed clindamycin 300 mg 3 times daily. Previous cultures of this area grew coag negative staph very resistant. Previously tolerated the clindamycin well 2. He already has a follow-up next week  3. I recommended topical antibiotic and Band-Aid to the small area of what I believed to be a developing paronychia in the left fourth finger 4. Keep the skin tear kerlix on the left arm 5. The right heel is doing well Electronic Signature(s) Signed: 07/09/2020 5:50:43 PM By: Linton Ham MD Entered By: Linton Ham on 07/09/2020 16:59:32 -------------------------------------------------------------------------------- SuperBill Details Patient Name: Date of Service: [name] 07/09/2020 Medical Record Number: [id number] Patient Account Number:  [id number] Date of Birth/Sex: Treating RN: [birth year] (87 y.o. Burnadette Pop, Lauren Primary Care Provider: Sherrie Mustache Other Clinician: Referring Provider: Treating Provider/Extender: Deloria Lair in Treatment: 48 Diagnosis Coding ICD-10 Codes Code Description E11.621 Type 2 diabetes mellitus with foot ulcer L97.514 Non-pressure chronic ulcer of other part of right foot with necrosis of bone E11.40 Type 2 diabetes mellitus with diabetic neuropathy, unspecified L97.412 Non-pressure chronic ulcer of right heel and midfoot with fat layer exposed L97.521 Non-pressure chronic ulcer of other part of left foot limited to breakdown of skin Facility Procedures CPT4 Code: [id number] Description: [phone] - WOUND CARE VISIT-LEV 5 EST PT Modifier: Quantity: 1 Physician Procedures : CPT4 Code Description Modifier [id number] - WC PHYS LEVEL 3 - EST PT ICD-10 Diagnosis Description L97.514 Non-pressure chronic ulcer of other part of right foot with necrosis of bone L97.412 Non-pressure chronic ulcer of right heel and midfoot with  fat layer exposed Quantity: 1 Electronic Signature(s) Signed: 07/09/2020 5:50:43 PM By: Linton Ham MD Entered By: Linton Ham on 07/09/2020 17:00:06

## 2020-07-10 NOTE — Progress Notes (Signed)
AABAN, GRIEP ([id number]) Visit Report for 07/09/2020 Arrival Information Details Patient Name: Date of Service: [name] 07/09/2020 1:45 PM Medical Record Number: [id number] Patient Account Number: [id number] Date of Birth/Sex: Treating RN: 09/24/1923 (87 y.o. Burnadette Pop, Lauren Primary Care Arvo Ealy: Sherrie Mustache Other Clinician: Referring Alfreddie Consalvo: Treating Jossette Zirbel/Extender: Deloria Lair in Treatment: 69 Visit Information History Since Last Visit Added or deleted any medications: No Patient Arrived: Wheel Chair Any new allergies or adverse reactions: No Arrival Time: 14:14 Had a fall or experienced change in No Accompanied By: craegiver activities of daily living that may affect Transfer Assistance: None risk of falls: Patient Identification Verified: Yes Signs or symptoms of abuse/neglect since last visito No Secondary Verification Process Completed: Yes Hospitalized since last visit: No Patient Requires Transmission-Based Precautions: No Implantable device outside of the clinic excluding No Patient Has Alerts: Yes cellular tissue based products placed in the center Patient Alerts: R ABI non compressible since last visit: Has Dressing in Place as Prescribed: Yes Pain Present Now: No Electronic Signature(s) Signed: 07/10/2020 7:45:09 AM By: Sandre Kitty Entered By: Sandre Kitty on 07/09/2020 14:14:35 -------------------------------------------------------------------------------- Clinic Level of Care Assessment Details Patient Name: Date of Service: [place] Doylene Canard 07/09/2020 1:45 PM Medical Record Number: [id number] Patient Account Number: [id number] Date of Birth/Sex: Treating RN: 10/08/1923 (87 y.o. Burnadette Pop, Lauren Primary Care Clance Baquero: Sherrie Mustache Other Clinician: Referring Shanik Brookshire: Treating Jalaysia Lobb/Extender: Deloria Lair in Treatment: 39 Clinic Level of Care Assessment  Items TOOL 4 Quantity Score X- 1 0 Use when only an EandM is performed on FOLLOW-UP visit ASSESSMENTS - Nursing Assessment / Reassessment X- 1 10 Reassessment of Co-morbidities (includes updates in patient status) X- 1 5 Reassessment of Adherence to Treatment Plan ASSESSMENTS - Wound and Skin A ssessment / Reassessment []  - 0 Simple Wound Assessment / Reassessment - one wound X- 3 5 Complex Wound Assessment / Reassessment - multiple wounds X- 1 10 Dermatologic / Skin Assessment (not related to wound area) ASSESSMENTS - Focused Assessment []  - 0 Circumferential Edema Measurements - multi extremities X- 1 10 Nutritional Assessment / Counseling / Intervention X- 1 5 Lower Extremity Assessment (monofilament, tuning fork, pulses) []  - 0 Peripheral Arterial Disease Assessment (using hand held doppler) ASSESSMENTS - Ostomy and/or Continence Assessment and Care []  - 0 Incontinence Assessment and Management []  - 0 Ostomy Care Assessment and Management (repouching, etc.) PROCESS - Coordination of Care []  - 0 Simple Patient / Family Education for ongoing care X- 1 20 Complex (extensive) Patient / Family Education for ongoing care X- 1 10 Staff obtains Programmer, systems, Records, T Results / Process Orders est X- 1 10 Staff telephones HHA, Nursing Homes / Clarify orders / etc []  - 0 Routine Transfer to another Facility (non-emergent condition) []  - 0 Routine Hospital Admission (non-emergent condition) []  - 0 New Admissions / Biomedical engineer / Ordering NPWT Apligraf, etc. , []  - 0 Emergency Hospital Admission (emergent condition) X- 1 10 Simple Discharge Coordination []  - 0 Complex (extensive) Discharge Coordination PROCESS - Special Needs []  - 0 Pediatric / Minor Patient Management []  - 0 Isolation Patient Management []  - 0 Hearing / Language / Visual special needs []  - 0 Assessment of Community assistance (transportation, D/C planning, etc.) []  - 0 Additional  assistance / Altered mentation []  - 0 Support Surface(s) Assessment (bed, cushion, seat, etc.) INTERVENTIONS - Wound Cleansing / Measurement []  - 0 Simple Wound Cleansing - one wound X- 3 5 Complex Wound  Cleansing - multiple wounds X- 1 5 Wound Imaging (photographs - any number of wounds) []  - 0 Wound Tracing (instead of photographs) []  - 0 Simple Wound Measurement - one wound X- 3 5 Complex Wound Measurement - multiple wounds INTERVENTIONS - Wound Dressings []  - 0 Small Wound Dressing one or multiple wounds X- 3 15 Medium Wound Dressing one or multiple wounds []  - 0 Large Wound Dressing one or multiple wounds []  - 0 Application of Medications - topical []  - 0 Application of Medications - injection INTERVENTIONS - Miscellaneous []  - 0 External ear exam []  - 0 Specimen Collection (cultures, biopsies, blood, body fluids, etc.) []  - 0 Specimen(s) / Culture(s) sent or taken to Lab for analysis []  - 0 Patient Transfer (multiple staff / Civil Service fast streamer / Similar devices) []  - 0 Simple Staple / Suture removal (25 or less) []  - 0 Complex Staple / Suture removal (26 or more) []  - 0 Hypo / Hyperglycemic Management (close monitor of Blood Glucose) []  - 0 Ankle / Brachial Index (ABI) - do not check if billed separately X- 1 5 Vital Signs Has the patient been seen at the hospital within the last three years: Yes Total Score: 190 Level Of Care: New/Established - Level 5 Electronic Signature(s) Signed: 07/09/2020 5:23:06 PM By: Rhae Hammock RN Entered By: Rhae Hammock on 07/09/2020 15:29:27 -------------------------------------------------------------------------------- Encounter Discharge Information Details Patient Name: Date of Service: [name] 07/09/2020 1:45 PM Medical Record Number: [id number] Patient Account Number: [id number] Date of Birth/Sex: Treating RN: 03-02-1924 (87 y.o. Marcheta Grammes Primary Care Meghana Tullo: Sherrie Mustache Other  Clinician: Referring Daana Petrasek: Treating Jereme Loren/Extender: Deloria Lair in Treatment: 48 Encounter Discharge Information Items Discharge Condition: Stable Ambulatory Status: Wheelchair Discharge Destination: Home Transportation: Private Auto Accompanied By: Caregiver Schedule Follow-up Appointment: Yes Clinical Summary of Care: Provided on 07/09/2020 Form Type Recipient Paper Patient Patient Electronic Signature(s) Signed: 07/09/2020 3:29:50 PM By: Lorrin Jackson Entered By: Lorrin Jackson on 07/09/2020 15:29:50 -------------------------------------------------------------------------------- Lower Extremity Assessment Details Patient Name: Date of Service: [name] 07/09/2020 1:45 PM Medical Record Number: [id number] Patient Account Number: [id number] Date of Birth/Sex: Treating RN: October 16, 1923 (87 y.o. [name] Primary Care Tayjon Halladay: Sherrie Mustache Other Clinician: Referring Dazaria Macneill: Treating Louellen Haldeman/Extender: Deloria Lair in Treatment: 50 Edema Assessment Assessed: Shirlyn Goltz: No] [Right: No] Edema: [Left: N] [Right: o] Calf Left: Right: Point of Measurement: From Medial Instep 26.3 cm 27.5 cm Ankle Left: Right: Point of Measurement: From Medial Instep 19.5 cm 21.7 cm Vascular Assessment Pulses: Dorsalis Pedis Palpable: [Left:Yes] [Right:Yes] Electronic Signature(s) Signed: 07/09/2020 5:27:17 PM By: Baruch Gouty RN, BSN Entered By: Baruch Gouty on 07/09/2020 14:40:07 -------------------------------------------------------------------------------- Multi Wound Chart Details Patient Name: Date of Service: [name] 07/09/2020 1:45 PM Medical Record Number: [id number] Patient Account Number: [id number] Date of Birth/Sex: Treating RN: Aug 09, 1923 (87 y.o. Burnadette Pop, Lauren Primary Care Jahmeir Geisen: Sherrie Mustache Other Clinician: Referring Isair Inabinet: Treating Unika Nazareno/Extender: Deloria Lair in Treatment: 88 Vital Signs Height(in): 74 Pulse(bpm): 70 Weight(lbs): 140 Blood Pressure(mmHg): 122/59 Body Mass Index(BMI): 20 Temperature(F): 98.0 Respiratory Rate(breaths/min): 17 Photos: [6:No Photos] Right T Great oe Right Calcaneus Left T Fifth oe Wound Location: Gradually Appeared Gradually Appeared Pressure Injury Wounding Event: Diabetic Wound/Ulcer of the Lower Pressure Ulcer Pressure Ulcer Primary Etiology: Extremity N/A Arterial Insufficiency Ulcer Diabetic Wound/Ulcer of the Lower Secondary Etiology: Extremity Cataracts, Arrhythmia, Hypertension, Cataracts, Arrhythmia, Hypertension, Cataracts, Arrhythmia, Hypertension, Comorbid History: Peripheral Arterial Disease, Type  II Peripheral Arterial Disease, Type II Peripheral Arterial Disease, Type II Diabetes, Osteomyelitis, Dementia, Diabetes, Osteomyelitis, Dementia, Diabetes, Osteomyelitis, Dementia, Neuropathy, Received Chemotherapy Neuropathy, Received Chemotherapy Neuropathy, Received Chemotherapy 06/15/2019 09/21/2019 05/01/2020 Date Acquired: 30 38 9 Weeks of Treatment: Open Open Healed - Epithelialized Wound Status: 4x8x0.1 0.1x0.1x0.1 0x0x0 Measurements L x W x D (cm) 25.133 0.008 0 A (cm) : rea 2.513 0.001 0 Volume (cm) : -26637.20% 100.00% 100.00% % Reduction in A rea: -8875.00% 100.00% 100.00% % Reduction in Volume: Grade 4 Unstageable/Unclassified Category/Stage II Classification: None Present Small None Present Exudate A mount: N/A Serosanguineous N/A Exudate Type: N/A red, brown N/A Exudate Color: Indistinct, nonvisible Flat and Intact N/A Wound Margin: None Present (0%) None Present (0%) None Present (0%) Granulation A mount: N/A N/A N/A Granulation Quality: Large (67-100%) None Present (0%) None Present (0%) Necrotic A mount: Eschar N/A N/A Necrotic Tissue: Fascia: No Fascia: No Fascia: No Exposed Structures: Fat Layer (Subcutaneous Tissue):  No Fat Layer (Subcutaneous Tissue): No Fat Layer (Subcutaneous Tissue): No Tendon: No Tendon: No Tendon: No Muscle: No Muscle: No Muscle: No Joint: No Joint: No Joint: No Bone: No Bone: No Bone: No None Large (67-100%) Large (67-100%) Epithelialization: N/A calloused N/A Assessment Notes: Wound Number: 7 N/A N/A Photos: N/A N/A Left Forearm N/A N/A Wound Location: Skin T ear/Laceration N/A N/A Wounding Event: Skin T ear N/A N/A Primary Etiology: N/A N/A N/A Secondary Etiology: Cataracts, Arrhythmia, Hypertension, N/A N/A Comorbid History: Peripheral Arterial Disease, Type II Diabetes, Osteomyelitis, Dementia, Neuropathy, Received Chemotherapy 07/05/2020 N/A N/A Date Acquired: 0 N/A N/A Weeks of Treatment: Open N/A N/A Wound Status: 1.7x0.4x0.1 N/A N/A Measurements L x W x D (cm) 0.534 N/A N/A A (cm) : rea 0.053 N/A N/A Volume (cm) : 0.00% N/A N/A % Reduction in Area: 0.00% N/A N/A % Reduction in Volume: Full Thickness Without Exposed N/A N/A Classification: Support Structures Small N/A N/A Exudate A mount: Serosanguineous N/A N/A Exudate Type: red, brown N/A N/A Exudate Color: Flat and Intact N/A N/A Wound Margin: Large (67-100%) N/A N/A Granulation Amount: Red N/A N/A Granulation Quality: None Present (0%) N/A N/A Necrotic Amount: N/A N/A N/A Necrotic Tissue: Fat Layer (Subcutaneous Tissue): Yes N/A N/A Exposed Structures: Fascia: No Tendon: No Muscle: No Joint: No Bone: No Small (1-33%) N/A N/A Epithelialization: N/A N/A N/A Assessment Notes: Treatment Notes Wound #2 (Toe Great) Wound Laterality: Right Cleanser Soap and Water Discharge Instruction: May shower and wash wound with dial antibacterial soap and water prior to dressing change. Peri-Wound Care Topical Primary Dressing Secondary Dressing ALLEVYN Heel 4 1/2in x 5 1/2in / 10.5cm x 13.5cm Discharge Instruction: Apply over primary dressing as directed. Secured  With [employer], 4.5x3.1 (in/yd) Discharge Instruction: Secure with Kerlix as directed. Paper Tape, 2x10 (in/yd) Discharge Instruction: Secure dressing with tape as directed. Compression Wrap Compression Stockings Add-Ons Wound #3 (Calcaneus) Wound Laterality: Right Cleanser Soap and Water Discharge Instruction: May shower and wash wound with dial antibacterial soap and water prior to dressing change. Peri-Wound Care Topical Primary Dressing Secondary Dressing ALLEVYN Heel 4 1/2in x 5 1/2in / 10.5cm x 13.5cm Discharge Instruction: Apply over primary dressing as directed. Secured With [employer], 4.5x3.1 (in/yd) Discharge Instruction: Secure with Kerlix as directed. Paper Tape, 2x10 (in/yd) Discharge Instruction: Secure dressing with tape as directed. Compression Wrap Compression Stockings Add-Ons Wound #7 (Forearm) Wound Laterality: Left Cleanser Soap and Water Discharge Instruction: May shower and wash wound with dial antibacterial soap and water prior to dressing change. Peri-Wound Care  Topical Primary Dressing Secondary Dressing Woven Gauze Sponge, Non-Sterile 4x4 in Discharge Instruction: Apply over primary dressing as directed. Secured With [employer], 4.5x3.1 (in/yd) Discharge Instruction: Secure with Kerlix as directed. Paper Tape, 2x10 (in/yd) Discharge Instruction: Secure dressing with tape as directed. Compression Wrap Compression Stockings Add-Ons Electronic Signature(s) Signed: 07/09/2020 5:23:06 PM By: Rhae Hammock RN Signed: 07/09/2020 5:50:43 PM By: Linton Ham MD Entered By: Linton Ham on 07/09/2020 16:51:09 -------------------------------------------------------------------------------- Multi-Disciplinary Care Plan Details Patient Name: Date of Service: [name] 07/09/2020 1:45 PM Medical Record Number: [id number] Patient Account Number: [id number] Date of Birth/Sex: Treating RN: Apr 22, 1923 (87 y.o.  Burnadette Pop, Lauren Primary Care Domonick Sittner: Sherrie Mustache Other Clinician: Referring Yacob Wilkerson: Treating Henrique Parekh/Extender: Deloria Lair in Treatment: [address] [place] reviewed with physician Active Inactive Wound/Skin Impairment Nursing Diagnoses: Knowledge deficit related to ulceration/compromised skin integrity Goals: Patient/caregiver will verbalize understanding of skin care regimen Date Initiated: 07/25/2019 Target Resolution Date: 09/06/2020 Goal Status: Active Ulcer/skin breakdown will have a volume reduction of 30% by week 4 Date Initiated: 07/25/2019 Date Inactivated: 08/28/2019 Target Resolution Date: 08/25/2019 Goal Status: Unmet Unmet Reason: Osteomyelitis Interventions: Assess patient/caregiver ability to obtain necessary supplies Assess patient/caregiver ability to perform ulcer/skin care regimen upon admission and as needed Assess ulceration(s) every visit Notes: Electronic Signature(s) Signed: 07/09/2020 5:23:06 PM By: Rhae Hammock RN Entered By: Rhae Hammock on 07/09/2020 15:15:43 -------------------------------------------------------------------------------- Pain Assessment Details Patient Name: Date of Service: [name] 07/09/2020 1:45 PM Medical Record Number: [id number] Patient Account Number: [id number] Date of Birth/Sex: Treating RN: [birth year] (87 y.o. Burnadette Pop, Lauren Primary Care Sharlyn Odonnel: Sherrie Mustache Other Clinician: Referring Khira Cudmore: Treating Merrik Puebla/Extender: Deloria Lair in Treatment: 50 Active Problems Location of Pain Severity and Description of Pain Patient Has Paino No Site Locations Pain Management and Medication Current Pain Management: Electronic Signature(s) Signed: 07/09/2020 5:23:06 PM By: Rhae Hammock RN Signed: 07/10/2020 7:45:09 AM By: Sandre Kitty Entered By: Sandre Kitty on 07/09/2020  14:15:18 -------------------------------------------------------------------------------- Patient/Caregiver Education Details Patient Name: Date of Service: [place] Doylene Canard 3/22/2022andnbsp1:45 PM Medical Record Number: [id number] Patient Account Number: [id number] Date of Birth/Gender: Treating RN: 1923/07/29 (87 y.o. Erie [name] Primary Care Physician: Sherrie Mustache Other Clinician: Referring Physician: Treating Physician/Extender: Deloria Lair in Treatment: 44 Education Assessment Education Provided To: Patient Education Topics Provided Wound/Skin Impairment: Methods: Explain/Verbal Responses: State content correctly [employer]) Signed: 07/09/2020 5:23:06 PM By: Rhae Hammock RN Entered By: Rhae Hammock on 07/09/2020 15:15:58 -------------------------------------------------------------------------------- Wound Assessment Details Patient Name: Date of Service: [name] 07/09/2020 1:45 PM Medical Record Number: [id number] Patient Account Number: [id number] Date of Birth/Sex: Treating RN: 1923/12/14 (87 y.o. Burnadette Pop, Lauren Primary Care Yareli Carthen: Sherrie Mustache Other Clinician: Referring Litsy Epting: Treating Arlie Posch/Extender: Deloria Lair in Treatment: 15 Wound Status Wound Number: 2 Primary Diabetic Wound/Ulcer of the Lower Extremity Etiology: Wound Location: Right T Great oe Wound Open Wounding Event: Gradually Appeared Status: Date Acquired: 06/15/2019 Comorbid Cataracts, Arrhythmia, Hypertension, Peripheral Arterial Disease, Weeks Of Treatment: 50 History: Type II Diabetes, Osteomyelitis, Dementia, Neuropathy, Received Clustered Wound: No Chemotherapy Photos Wound Measurements Length: (cm) 4 Width: (cm) 8 Depth: (cm) 0.1 Area: (cm) 25.133 Volume: (cm) 2.513 % Reduction in Area: -26637.2% % Reduction in Volume: -8875% Epithelialization: None Tunneling:  No Undermining: No Wound Description Classification: Grade 4 Wound Margin: Indistinct, nonvisible Exudate Amount: None Present Foul Odor After Cleansing: No Slough/Fibrino No Wound Bed Granulation Amount: None Present (  0%) Exposed Structure Necrotic Amount: Large (67-100%) Fascia Exposed: No Necrotic Quality: Eschar Fat Layer (Subcutaneous Tissue) Exposed: No Tendon Exposed: No Muscle Exposed: No Joint Exposed: No Bone Exposed: No Treatment Notes Wound #2 (Toe Great) Wound Laterality: Right Cleanser Soap and Water Discharge Instruction: May shower and wash wound with dial antibacterial soap and water prior to dressing change. Peri-Wound Care Topical Primary Dressing Secondary Dressing ALLEVYN Heel 4 1/2in x 5 1/2in / 10.5cm x 13.5cm Discharge Instruction: Apply over primary dressing as directed. Secured With [employer], 4.5x3.1 (in/yd) Discharge Instruction: Secure with Kerlix as directed. Paper Tape, 2x10 (in/yd) Discharge Instruction: Secure dressing with tape as directed. Compression Wrap Compression Stockings Add-Ons Electronic Signature(s) Signed: 07/09/2020 5:23:06 PM By: Rhae Hammock RN Signed: 07/10/2020 7:45:09 AM By: Sandre Kitty Entered By: Sandre Kitty on 07/09/2020 16:23:40 -------------------------------------------------------------------------------- Wound Assessment Details Patient Name: Date of Service: [name] 07/09/2020 1:45 PM Medical Record Number: [id number] Patient Account Number: [id number] Date of Birth/Sex: Treating RN: 1924/01/13 (87 y.o. Marcheta Grammes Primary Care Jahzir Strohmeier: Sherrie Mustache Other Clinician: Referring Kathlen Sakurai: Treating Antonieta Slaven/Extender: Deloria Lair in Treatment: 29 Wound Status Wound Number: 3 Primary Pressure Ulcer Etiology: Wound Location: Right Calcaneus Secondary Arterial Insufficiency Ulcer Wounding Event: Gradually Appeared Etiology: Date  Acquired: 09/21/2019 Wound Open Weeks Of Treatment: 38 Status: Clustered Wound: No Comorbid Cataracts, Arrhythmia, Hypertension, Peripheral Arterial Disease, History: Type II Diabetes, Osteomyelitis, Dementia, Neuropathy, Received Chemotherapy Photos Wound Measurements Length: (cm) 0.1 Width: (cm) 0.1 Depth: (cm) 0.1 Area: (cm) 0.008 Volume: (cm) 0.001 % Reduction in Area: 100% % Reduction in Volume: 100% Epithelialization: Large (67-100%) Tunneling: No Undermining: No Wound Description Classification: Unstageable/Unclassified Wound Margin: Flat and Intact Exudate Amount: Small Exudate Type: Serosanguineous Exudate Color: red, brown Foul Odor After Cleansing: No Slough/Fibrino Yes Wound Bed Granulation Amount: None Present (0%) Exposed Structure Necrotic Amount: None Present (0%) Fascia Exposed: No Fat Layer (Subcutaneous Tissue) Exposed: No Tendon Exposed: No Muscle Exposed: No Joint Exposed: No Bone Exposed: No Assessment Notes calloused Treatment Notes Wound #3 (Calcaneus) Wound Laterality: Right Cleanser Soap and Water Discharge Instruction: May shower and wash wound with dial antibacterial soap and water prior to dressing change. Peri-Wound Care Topical Primary Dressing Secondary Dressing ALLEVYN Heel 4 1/2in x 5 1/2in / 10.5cm x 13.5cm Discharge Instruction: Apply over primary dressing as directed. Secured With [employer], 4.5x3.1 (in/yd) Discharge Instruction: Secure with Kerlix as directed. Paper Tape, 2x10 (in/yd) Discharge Instruction: Secure dressing with tape as directed. Compression Wrap Compression Stockings Add-Ons Electronic Signature(s) Signed: 07/09/2020 5:02:10 PM By: Lorrin Jackson Signed: 07/10/2020 7:45:09 AM By: Sandre Kitty Entered By: Sandre Kitty on 07/09/2020 16:23:04 -------------------------------------------------------------------------------- Wound Assessment Details Patient Name: Date of Service: MA  [name] 07/09/2020 1:45 PM Medical Record Number: [id number] Patient Account Number: [id number] Date of Birth/Sex: Treating RN: 1923/11/25 (87 y.o. Burnadette Pop, Lauren Primary Care Naksh Radi: Sherrie Mustache Other Clinician: Referring Peniel Biel: Treating Florentine Diekman/Extender: Deloria Lair in Treatment: 67 Wound Status Wound Number: 6 Primary Pressure Ulcer Etiology: Wound Location: Left T Fifth oe Secondary Diabetic Wound/Ulcer of the Lower Extremity Wounding Event: Pressure Injury Etiology: Date Acquired: 05/01/2020 Wound Healed - Epithelialized Weeks Of Treatment: 9 Status: Clustered Wound: No Comorbid Cataracts, Arrhythmia, Hypertension, Peripheral Arterial Disease, History: Type II Diabetes, Osteomyelitis, Dementia, Neuropathy, Received Chemotherapy Wound Measurements Length: (cm) Width: (cm) Depth: (cm) Area: (cm) Volume: (cm) 0 % Reduction in Area: 100% 0 % Reduction in Volume: 100% 0 Epithelialization: Large (67-100%) 0  Tunneling: No 0 Undermining: No Wound Description Classification: Category/Stage II Exudate Amount: None Present Foul Odor After Cleansing: No Slough/Fibrino No Wound Bed Granulation Amount: None Present (0%) Exposed Structure Necrotic Amount: None Present (0%) Fascia Exposed: No Fat Layer (Subcutaneous Tissue) Exposed: No Tendon Exposed: No Muscle Exposed: No Joint Exposed: No Bone Exposed: No Electronic Signature(s) Signed: 07/09/2020 5:23:06 PM By: Rhae Hammock RN Signed: 07/09/2020 5:27:17 PM By: Baruch Gouty RN, BSN Entered By: Baruch Gouty on 07/09/2020 14:42:48 -------------------------------------------------------------------------------- Wound Assessment Details Patient Name: Date of Service: [name] 07/09/2020 1:45 PM Medical Record Number: [id number] Patient Account Number: [id number] Date of Birth/Sex: Treating RN: 11-18-1923 (87 y.o. Burnadette Pop, Lauren Primary Care  Kaysan Peixoto: Sherrie Mustache Other Clinician: Referring Azelyn Batie: Treating Khylee Algeo/Extender: Deloria Lair in Treatment: 59 Wound Status Wound Number: 7 Primary Skin Tear Etiology: Wound Location: Left Forearm Wound Open Wounding Event: Skin Tear/Laceration Status: Date Acquired: 07/05/2020 Comorbid Cataracts, Arrhythmia, Hypertension, Peripheral Arterial Disease, Weeks Of Treatment: 0 History: Type II Diabetes, Osteomyelitis, Dementia, Neuropathy, Received Clustered Wound: No Chemotherapy Photos Wound Measurements Length: (cm) 1.7 Width: (cm) 0.4 Depth: (cm) 0.1 Area: (cm) 0.534 Volume: (cm) 0.053 % Reduction in Area: 0% % Reduction in Volume: 0% Epithelialization: Small (1-33%) Tunneling: No Undermining: No Wound Description Classification: Full Thickness Without Exposed Support Structures Wound Margin: Flat and Intact Exudate Amount: Small Exudate Type: Serosanguineous Exudate Color: red, brown Foul Odor After Cleansing: No Slough/Fibrino No Wound Bed Granulation Amount: Large (67-100%) Exposed Structure Granulation Quality: Red Fascia Exposed: No Necrotic Amount: None Present (0%) Fat Layer (Subcutaneous Tissue) Exposed: Yes Tendon Exposed: No Muscle Exposed: No Joint Exposed: No Bone Exposed: No Treatment Notes Wound #7 (Forearm) Wound Laterality: Left Cleanser Soap and Water Discharge Instruction: May shower and wash wound with dial antibacterial soap and water prior to dressing change. Peri-Wound Care Topical Primary Dressing Secondary Dressing Woven Gauze Sponge, Non-Sterile 4x4 in Discharge Instruction: Apply over primary dressing as directed. Secured With [employer], 4.5x3.1 (in/yd) Discharge Instruction: Secure with Kerlix as directed. Paper Tape, 2x10 (in/yd) Discharge Instruction: Secure dressing with tape as directed. Compression Wrap Compression Stockings Add-Ons Electronic Signature(s) Signed:  07/09/2020 5:23:06 PM By: Rhae Hammock RN Signed: 07/10/2020 7:45:09 AM By: Sandre Kitty Entered By: Sandre Kitty on 07/09/2020 16:24:02 -------------------------------------------------------------------------------- Vitals Details Patient Name: Date of Service: [name] 07/09/2020 1:45 PM Medical Record Number: [id number] Patient Account Number: [id number] Date of Birth/Sex: Treating RN: 09-Oct-1923 (87 y.o. Burnadette Pop, Lauren Primary Care Creedence Kunesh: Sherrie Mustache Other Clinician: Referring Linzie Boursiquot: Treating Delanda Bulluck/Extender: Deloria Lair in Treatment: 50 Vital Signs Time Taken: 14:15 Temperature (F): 98.0 Height (in): 70 Pulse (bpm): 72 Weight (lbs): 140 Respiratory Rate (breaths/min): 17 Body Mass Index (BMI): 20.1 Blood Pressure (mmHg): 122/59 Reference Range: 80 - 120 mg / dl Electronic Signature(s) Signed: 07/10/2020 7:45:09 AM By: Sandre Kitty Entered By: Sandre Kitty on 07/09/2020 14:17:00

## 2020-07-11 ENCOUNTER — Telehealth: Payer: Self-pay | Admitting: *Deleted

## 2020-07-11 NOTE — Telephone Encounter (Signed)
Bethena Roys with [employer] called requesting verbal order to extend patient's Therapy due to swallowing 1x3weeks.  Verbal order given.

## 2020-07-16 ENCOUNTER — Other Ambulatory Visit: Payer: Self-pay

## 2020-07-16 ENCOUNTER — Encounter (HOSPITAL_BASED_OUTPATIENT_CLINIC_OR_DEPARTMENT_OTHER): Payer: Medicare Other | Admitting: Internal Medicine

## 2020-07-16 DIAGNOSIS — L97514 Non-pressure chronic ulcer of other part of right foot with necrosis of bone: Secondary | ICD-10-CM | POA: Diagnosis not present

## 2020-07-16 DIAGNOSIS — E1140 Type 2 diabetes mellitus with diabetic neuropathy, unspecified: Secondary | ICD-10-CM | POA: Diagnosis not present

## 2020-07-16 DIAGNOSIS — S40812D Abrasion of left upper arm, subsequent encounter: Secondary | ICD-10-CM | POA: Diagnosis not present

## 2020-07-16 DIAGNOSIS — L97412 Non-pressure chronic ulcer of right heel and midfoot with fat layer exposed: Secondary | ICD-10-CM | POA: Diagnosis not present

## 2020-07-16 DIAGNOSIS — S50812A Abrasion of left forearm, initial encounter: Secondary | ICD-10-CM | POA: Diagnosis not present

## 2020-07-16 DIAGNOSIS — L97521 Non-pressure chronic ulcer of other part of left foot limited to breakdown of skin: Secondary | ICD-10-CM | POA: Diagnosis not present

## 2020-07-16 DIAGNOSIS — E11621 Type 2 diabetes mellitus with foot ulcer: Secondary | ICD-10-CM | POA: Diagnosis not present

## 2020-07-18 NOTE — Progress Notes (Signed)
[name], MONTFORT ([id number]) Visit Report for 07/16/2020 HPI Details Patient Name: Date of Service: [name] Rivka Foster [facility] E. 07/16/2020 2:00 PM Medical Record Number: [id number] Patient Account Number: [id number] Date of Birth/Sex: Treating RN: Feb 12, 1924 (87 y.o. Erie [name] Primary Care Provider: Sherrie Mustache Other Clinician: Referring Provider: Treating Provider/Extender: Deloria Lair in Treatment: 77 History of Present Illness HPI Description: ADMISSION 05/02/2019 This is a 87 year old man accompanied by his daughter. He lives in [place] with his elderly wife. He has home caregivers as well. Apparently on February 28, 2019 they noted blood on his sock and noticed a wound on the tip of his right great toe. He has been cared for by Dr. Posey Pronto who is his podiatrist in [place]. Apparently they have been using Betadine to this area. The patient has a thick mycotic nail on the right as well which is embedded in the wound in some areas. Fortunately the patient has diabetic neuropathy and is not really experiencing any pain otherwise I think this would all be quite painful. The patient was previously treated in 2019 for wounds on the left foot. This included a fairly thorough vascular review by vein and vascular Dr. Oneida Alar. His ABIs at that time were noncompressible bilaterally TBI on the right was 0.88 with biphasic waveforms on the right and monophasic waveforms at the left. He ended up having amputations of the left first and second toes. He did have an angiogram on 06/25/2017 which showed on the right the right common femoral profundofemoral for Morris and superficial femoral arteries were widely patent the right popliteal was widely patent. Tibial vessels were not well opacified secondary to motion artifact however he was felt to have all 3 tibial vessels intact with some calcification and some areas of stenosis within the posterior  tibial artery but relatively good flow to the right lower leg. He also had the left reviewed as well which was the source of the problem at that time. Past medical history; prostate cancer on oral antiandrogen therapy with bone mets, GI stromal tumor in 2005, type 2 diabetes with peripheral neuropathy, hypertension, atrial fibrillation on Eliquis, history of non-Hodgkin's lymphoma treated with CHOP in 2005. He had amputations of the left first and second toes secondary to osteomyelitis. Does not appear that he has had an x-ray of the right foot ABI in our clinic on the right was noncompressible [see full arterial discussion above]. 1/19; area on the tip of the right great toe under a very mycotic nail tip. We use silver alginate. 1/26; area of the tip of the right great toe under a very mycotic nail. A lot of this seems to have closed down we have been using silver alginate 2/9; the areas on the tip of the right great toe under a very mycotic nail. A lot of this continues to close down. I do not think there is a subungual problem. I am hopeful that this mycotic nail which is thick and raised will allow full epithelialization of the wound 2/23; this was a difficult area on the tip of his right great toe. Thick mycotic nail over the base of the wound. This appears to have closed down now. The nail itself is thick split with considerable subungual debris. READMISSION 07/25/2019 This is a 87 year old man that we cared for earlier this year in the clinic without a wound on the tip of his right great toe under a very mycotic toenail. He is a type II diabetic. His  ABIs have been noncompressible however he has been felt to have adequate blood flow in the right to heal the wound. We were able to get this wound to close over as I understand things he followed with Dr. Caprice Beaver had some debridement of the nail everything looks fine and apparently the wound bed was washed off by his wife over the weekend and by  Monday it was open. His daughter is able to show me pictures. Unfortunately this now has exposed bone raising the possibility that he has had underlying osteomyelitis all along. He has been using Silvadene cream on this he has been referred back to the clinic by Dr. Caprice Beaver. He had x-rays of the right foot that did not show evidence of osteomyelitis in the right foot. He has not been on antibiotics He also had an x-ray of the left foot that showed findings suspicious for osteomyelitis involving the residual base of the first proximal phalanx and the proximal phalanx of the left second toe as well as the left second metatarsal head however he has no wounds in this area. 4/12; culture from last time of bone in the right great toe was negative. The patient's wife apparently scrubbed this toe vigorously and according to his daughter reopen the wound however unfortunately it is all exposed bone last time. I removed some of this and we use Prisma unfortunately the area does not look much better today. Previous x-rays done in podiatry office Dr. Caprice Beaver did not show evidence of osteomyelitis. Nevertheless I think osteomyelitis here is probably quite likely. Still using Prisma 4/26; he took 2 weeks of doxycycline although his daughter is telling me he is not eating. He has loose bowel movements however I did not get the sense that this is changed all that much. He still has exposed bone. Culture I did last time showed a few coag negative staph I do not think that this is necessarily relevant. I still am operating under the premise that the patient probably has osteomyelitis. After considerable difficulty we are able to determine he is not allergic to cephalosporins I therefore gave him Keflex. 5/10; is tolerating Keflex 500 3 times daily quite well. He still has the small wound in the tip of his toe with exposed bone.Marland Kitchen Unfortunately he does not really have any granulation tissue. He saw Dr. Caprice Beaver of  podiatry in [place] last week I have not had a chance to look at his notes The patient had an extensive vascular work-up including an angiogram in 2019. He was felt to have adequate blood supply on the right at the time although he did have amputations of the left first and second toe by Dr. Oneida Alar. 09/11/19 -Patient is back and is about to finish his Keflex, appointment with Dr. Oneida Alar is in the works. The right great toe wound looks about the same, the left third toe dorsal wound has healed. There was a small eschar covering it that was removed by podiatry.According to patient's daughter the right great toe base on the plantar surface had some redness that seems to have disappeared. 10/13/2019; patient is back in clinic today after 1 month hiatus although I have not seen him since 5/10. Since he was last here he was admitted to hospital from 6/2 through 6/5 because of a nonhealing wound on his right great toe. He underwent an angiogram which showed patent common femoral, profunda, SFA above and below-knee popliteal artery. The dominant runoff in the right lower extremity was through the anterior tibial  but that became very small at the level of the ankle and had minimal outflow into the foot. The peroneal artery was patent but atretic. 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 Interventions were attempted on the posterior tibial. Mechanical orbital atherectomy down to the level of the ankle and then this was angioplastied with 2.3 mm and 3 mm balloons. Flow remained very sluggish down the posterior tibial artery but was patent. Post event there apparently was embolization of an area involving the plantar left heel and sides of the left heel. He arrives back in clinic today it has been almost 7 weeks since I have seen him. Miraculously the great toe area with open bone is  closed over probably as result of Dr. Ainsley Spinner interventions. He does however have an extensive ischemic area on the plantar and medial aspect of his heel. I reviewed Dr. Ainsley Spinner last note from 3 days ago on 6/22. He did not think there was enough inflow to heal the heel wound. With eschar and sloughing. Wondered whether he could heal a below-knee amputation but more likely an above the knee amputation when required. He was placed on Xeroform to the right heel In discussing things with the patient's family and the patient present he is not in a lot of pain. The area does not appear to be grossly infected. The patient appears to be comfortable and conversational. He is eating and drinking fairly well. 7/9; the patient saw Dr. Carlis Abbott on 6/29. He had previously done heroic attempt to establish blood flow as described above. 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 Although the eschar on the heel looks ominous both plantar and medial it is certainly no worse than last week. We will continue Betadine to this area with alginate. Ultimately this will separate off and we will see about the viability of the tissue underneath. Also in clinic today our nurses were washing his right great toe tip apparently some of the skin came off eschared and there is an open wound at the tip of the great toe with exposed bone similar to what he had previously 7/23; he has the open area on the tip of his first toe which I think is mostly bone. Then he has the embolic area on the plantar heel extending into the medial heel. We have been using Betadine and silver alginate to the heel silver alginate to the toe. His daughter brings up that she was given nitroglycerin patches by Dr. Donzetta Matters to try and open blood flow to the foot. She has not been using them out of concern of systemic hypotension and also that the labels said not  to put them on extremities 8/10-Returns at 3 weeks, open area on the tip of his first toe that is dried out, the heel with eschar, applying Betadine to the eschar area and silver alginate dressing. 9/7; 1 month follow-up. He has an open area on the tip of the toe almost subungual with a thick mycotic nail. The heel wound has black eschar but this is beginning to separate. We have been using Betadine and silver alginate on the heel and simply silver alginate on the right great toe 9/23; the patient was worked in urgently at the request of his wife was concerned about infection in the tip of the  toe. Apparently this was brought up by home health. 10/14; the patient has the area of exposed bone on the tip of the right great toe. He also has the eschared area on the plantar heel which was a embolic phenomenon at the time of attempted revascularization. He also had an area of denuded epithelium over both buttocks which I think is probably a combination of pressure friction and moisture. They have been using zinc oxide which is appropriate 10/26. The patient has an area of wider exposed bone at the tip of the right great toe. The substantial eschared area on the heel was already 50% separated I remove this. Actually the patient's wife had called repetitively for urgent appointments with some degree of erythema in the foot as the primary concern. It turns out that she has been to see her primary doctor after the patient had a fall off the commode resulting in skin tears to both forearms. He was placed on doxycycline because of concern of cellulitis of the toe. She is also been to see [facility] in [place] with regards to the toe. Apparently he shaved the bone and recommended Betadine to this. Noteworthy that the patient already had an extensive arterial evaluation with an angiogram. He has known PAD with tibial vessel disease. The last effort was an attempt to open up the posterior tibial  artery which resulted in symptoms of embolization to the heel. 11/9; since the patient was last here he was brought to the ER on 11/4. He was felt to have cellulitis in the right great toe. They did not do a wound culture however they did blood cultures which showed 3 out of 3 Staph hominis. He has been treated with clindamycin 150 mg 3 times times a day. His daughter says he went to the hospital with altered LOC. He had been running a fever but was not febrile on arrival to the ER. Looking over the emergency room notes he had erythema in his foot and I think he had cellulitis. He had apparently been on doxycycline 100 mg twice daily prescribed by Dr. Caprice Beaver prior to this. The staph isolate itself is very resistant it is sensitive to vancomycin but resistant to methicillin's, quinolones, tetracycline and Septra. During the stay in the ER she was seen by vein and vascular they noted that he does not have any further options for revascularization. They do not think that he could undergo any foot conserving surgery therefore the only thing left here would be a BKA The area of the heel that I debrided last time he is here actually looks quite healthy nice healthy looking granulation tissue over perhaps 60 to 70% of the wound still 30% slough we have been using Iodoflex. They have been using silver collagen to the toe The other issue here is the daughter is struggling with the idea of in-home hospice. I went over this with her. I emphasized that hospice is not a home care agency as a velocity of care at the end of life. Patient is 87 years old he apparently is eating and drinking less and since I have known him he has become more frail 03/11/2020 upon evaluation today patient unfortunately is showing signs of progression in regard to his right great toe. Currently he is actually experiencing increased evidence of skin deterioration and in fact this toe is developing into more of a dry gangrene issue at  this point. He was signed up for hospice on Thursday and then apparently hospice was  called off on Friday due to the fact that they were talking about doing wet-to-dry dressings to both wound locations and his daughter was really concerned about this. She did not want anything to make the wounds progress faster than they should be. With that being said I think a wet-to-dry dressing would be okay for the heel location. I do not believe this would cause any harm. With that being said and in regard to the toe I really do agree I would not really want a wet-to-dry dressing here I think more of like Betadine and dry gauze dressing could be a appropriate way to go after the toe was painted with Betadine that is. 12/9. It has been a month since I saw this man. He was using Betadine to the great toe and Iodoflex to the area on the heel. The heel actually look quite good last time I saw this this is gotten larger and not nearly as healthy looking as last time. Moreover the right first toe wound now has dry gangrene Since they were last here they have not elected to sign up for hospice. They do not have home health. They are trying to get some home care through the [facility] 1/13; the patient's right first toe is mummified to the inter phalangeal joint. The area on the plantar aspect of his right heel actually measures quite a bit better. We have been using Iodoflex His daughter points out an area on the plantar aspect of the left 5th toe at roughly the level of the proximal inner phalangeal 2/10; right first toe remains mummified there is no change in this no evidence of infection no erythema spreading proximally into the foot He still has the area on the plantar aspect of his right heel. This requires a debridement. We have been using silver alginate He had an area last time at the base of his left fifth toe. This is healed however he has a new area on the medial part of the left fifth toe. This also  required debridement. We have been using silver alginate largely to all wound areas 06/27/2020 upon evaluation today patient continues to hang onto the great toe. This does seem to be fairly stable and dry although the patient's daughter notes there has been a little bit of drainage here. Fortunately there is no signs of active infection at this time. No fevers, chills, nausea, vomiting, or diarrhea. Overall very pleased with the fact that the patient seems to be hanging on and doing quite well. The heel is healing quite nicely. With that being said I think the toe eventually will self amputate. 3/22; this is a patient that I have not seen in a while although he was seen here 10 days ago. His daughter called to work him in today due to erythema around the mom mummified great toe concern for cellulitis. He also comes in today with complaints of a skin tear on the left forearm possibly an early paronychia a on the left fourth finger 3/29; the erythema surrounding his mummified right first toe is a lot better. My marks on the metatarsal head now have fairly decent looking skin. It would appear that the eschar itself is receding. The area on his left forearm a skin tear last week and the area on the right plantar heel which was an embolic wound at the time of attempts at revascularization have all closed over. Electronic Signature(s) Signed: 07/18/2020 7:47:56 AM By: Linton Ham MD Signed: 07/18/2020 7:47:56 AM By:  Linton Ham MD Entered By: Linton Ham on 07/16/2020 15:12:33 -------------------------------------------------------------------------------- Physical Exam Details Patient Name: Date of Service: [name] Offer 07/16/2020 2:00 PM Medical Record Number: [id number] Patient Account Number: [id number] Date of Birth/Sex: Treating RN: Jun 27, 1923 (87 y.o. Erie [name] Primary Care Provider: Sherrie Mustache Other Clinician: Referring Provider: Treating Provider/Extender:  Deloria Lair in Treatment: 60 Constitutional Sitting or standing Blood Pressure is within target range for patient.. Pulse regular and within target range for patient.Marland Kitchen Respirations regular, non-labored and within target range.. Temperature is normal and within the target range for the patient.Marland Kitchen Appears in no distress. Notes Wound exam; the patient's right first mummified toe actually looks somewhat better to the interphalangeal joint I wonder whether the more proximal area of is salvageable. The infection from last week which I treated with clindamycin seems to have done very well. Prep miraculously the right plantar heel wound is closed as is the left forearm that they were concerned about which was a skin tear from last week Electronic Signature(s) Signed: 07/18/2020 7:47:56 AM By: Linton Ham MD Entered By: Linton Ham on 07/16/2020 15:16:25 -------------------------------------------------------------------------------- Physician Orders Details Patient Name: Date of Service: [name] 07/16/2020 2:00 PM Medical Record Number: [id number] Patient Account Number: [id number] Date of Birth/Sex: Treating RN: 1924/04/08 (87 y.o. Erie [name] Primary Care Provider: Sherrie Mustache Other Clinician: Referring Provider: Treating Provider/Extender: Deloria Lair in Treatment: 27 Verbal / Phone Orders: No Diagnosis Coding Follow-up Appointments Return appointment in 1 month. Bathing/ Shower/ Hygiene May shower and wash wound with soap and water. - with dressing changes Edema Control - Lymphedema / SCD / Other Elevate legs to the level of the heart or above for 30 minutes daily and/or when sitting, a frequency of: Avoid standing for long periods of time. Moisturize legs daily. Additional Orders / Instructions Other: - Apply Desitin Zinc oxide redness, excoriated areas to buttock area daily and as needed for  protection. Home Health New wound care orders this week; continue Home Health for wound care. May utilize formulary equivalent dressing for wound treatment orders unless otherwise specified. - Advance home health to monitor weekly of right great toe. Wound Treatment Wound #2 - T Great oe Wound Laterality: Right Cleanser: Soap and Water (Home Health) 1 x Per Day/30 Days Discharge Instructions: May shower and wash wound with dial antibacterial soap and water prior to dressing change. Prim Dressing: betadine (Home Health) 1 x Per Day/30 Days ary Secondary Dressing: Woven Gauze Sponge, Non-Sterile 4x4 in (Home Health) 1 x Per Day/30 Days Discharge Instructions: Apply over primary dressing as directed. Secured With: [employer], 4.5x3.1 (in/yd) (Generic) 1 x Per Day/30 Days Discharge Instructions: Secure with Kerlix as directed. Secured With: Paper Tape, 2x10 (in/yd) (Generic) 1 x Per Day/30 Days Discharge Instructions: Secure dressing with tape as directed. Electronic Signature(s) Signed: 07/16/2020 5:10:40 PM By: Rhae Hammock RN Signed: 07/18/2020 7:47:56 AM By: Linton Ham MD Entered By: Rhae Hammock on 07/16/2020 15:02:35 -------------------------------------------------------------------------------- Problem List Details Patient Name: Date of Service: [name] 07/16/2020 2:00 PM Medical Record Number: [id number] Patient Account Number: [id number] Date of Birth/Sex: Treating RN: Feb 26, 1924 (87 y.o. Erie [name] Primary Care Provider: Sherrie Mustache Other Clinician: Referring Provider: Treating Provider/Extender: Deloria Lair in Treatment: 86 Active Problems ICD-10 Encounter Code Description Active Date MDM Diagnosis E11.621 Type 2 diabetes mellitus with foot ulcer 07/25/2019 No Yes L97.514 Non-pressure chronic ulcer of other part of  right foot with necrosis of bone 07/25/2019 No Yes E11.40 Type 2 diabetes mellitus  with diabetic neuropathy, unspecified 07/25/2019 No Yes L97.412 Non-pressure chronic ulcer of right heel and midfoot with fat layer exposed 02/13/2020 No Yes S40.812D Abrasion of left upper arm, subsequent encounter 02/13/2020 No Yes Inactive Problems ICD-10 Code Description Active Date Inactive Date S40.811D Abrasion of right upper arm, subsequent encounter 02/13/2020 02/13/2020 Resolved Problems Electronic Signature(s) Signed: 07/18/2020 7:47:56 AM By: Linton Ham MD Entered By: Linton Ham on 07/16/2020 15:11:19 -------------------------------------------------------------------------------- Progress Note Details Patient Name: Date of Service: [name] 07/16/2020 2:00 PM Medical Record Number: [id number] Patient Account Number: [id number] Date of Birth/Sex: Treating RN: [birth year] (87 y.o. Erie [name] Primary Care Provider: Sherrie Mustache Other Clinician: Referring Provider: Treating Provider/Extender: Deloria Lair in Treatment: 81 Subjective History of Present Illness (HPI) ADMISSION 05/02/2019 This is a 87 year old man accompanied by his daughter. He lives in [place] with his elderly wife. He has home caregivers as well. Apparently on February 28, 2019 they noted blood on his sock and noticed a wound on the tip of his right great toe. He has been cared for by Dr. Posey Pronto who is his podiatrist in [place]. Apparently they have been using Betadine to this area. The patient has a thick mycotic nail on the right as well which is embedded in the wound in some areas. Fortunately the patient has diabetic neuropathy and is not really experiencing any pain otherwise I think this would all be quite painful. The patient was previously treated in 2019 for wounds on the left foot. This included a fairly thorough vascular review by vein and vascular Dr. Oneida Alar. His ABIs at that time were noncompressible bilaterally TBI on the  right was 0.88 with biphasic waveforms on the right and monophasic waveforms at the left. He ended up having amputations of the left first and second toes. He did have an angiogram on 06/25/2017 which showed on the right the right common femoral profundofemoral for Morris and superficial femoral arteries were widely patent the right popliteal was widely patent. Tibial vessels were not well opacified secondary to motion artifact however he was felt to have all 3 tibial vessels intact with some calcification and some areas of stenosis within the posterior tibial artery but relatively good flow to the right lower leg. He also had the left reviewed as well which was the source of the problem at that time. Past medical history; prostate cancer on oral antiandrogen therapy with bone mets, GI stromal tumor in 2005, type 2 diabetes with peripheral neuropathy, hypertension, atrial fibrillation on Eliquis, history of non-Hodgkin's lymphoma treated with CHOP in 2005. He had amputations of the left first and second toes secondary to osteomyelitis. Does not appear that he has had an x-ray of the right foot ABI in our clinic on the right was noncompressible [see full arterial discussion above]. 1/19; area on the tip of the right great toe under a very mycotic nail tip. We use silver alginate. 1/26; area of the tip of the right great toe under a very mycotic nail. A lot of this seems to have closed down we have been using silver alginate 2/9; the areas on the tip of the right great toe under a very mycotic nail. A lot of this continues to close down. I do not think there is a subungual problem. I am hopeful that this mycotic nail which is thick and raised will allow full epithelialization of the  wound 2/23; this was a difficult area on the tip of his right great toe. Thick mycotic nail over the base of the wound. This appears to have closed down now. The nail itself is thick split with considerable subungual  debris. READMISSION 07/25/2019 This is a 87 year old man that we cared for earlier this year in the clinic without a wound on the tip of his right great toe under a very mycotic toenail. He is a type II diabetic. His ABIs have been noncompressible however he has been felt to have adequate blood flow in the right to heal the wound. We were able to get this wound to close over as I understand things he followed with Dr. Caprice Beaver had some debridement of the nail everything looks fine and apparently the wound bed was washed off by his wife over the weekend and by Monday it was open. His daughter is able to show me pictures. Unfortunately this now has exposed bone raising the possibility that he has had underlying osteomyelitis all along. He has been using Silvadene cream on this he has been referred back to the clinic by Dr. Caprice Beaver. He had x-rays of the right foot that did not show evidence of osteomyelitis in the right foot. He has not been on antibiotics He also had an x-ray of the left foot that showed findings suspicious for osteomyelitis involving the residual base of the first proximal phalanx and the proximal phalanx of the left second toe as well as the left second metatarsal head however he has no wounds in this area. 4/12; culture from last time of bone in the right great toe was negative. The patient's wife apparently scrubbed this toe vigorously and according to his daughter reopen the wound however unfortunately it is all exposed bone last time. I removed some of this and we use Prisma unfortunately the area does not look much better today. Previous x-rays done in podiatry office Dr. Caprice Beaver did not show evidence of osteomyelitis. Nevertheless I think osteomyelitis here is probably quite likely. Still using Prisma 4/26; he took 2 weeks of doxycycline although his daughter is telling me he is not eating. He has loose bowel movements however I did not get the sense that this is changed all  that much. He still has exposed bone. Culture I did last time showed a few coag negative staph I do not think that this is necessarily relevant. I still am operating under the premise that the patient probably has osteomyelitis. After considerable difficulty we are able to determine he is not allergic to cephalosporins I therefore gave him Keflex. 5/10; is tolerating Keflex 500 3 times daily quite well. He still has the small wound in the tip of his toe with exposed bone.Marland Kitchen Unfortunately he does not really have any granulation tissue. He saw Dr. Caprice Beaver of podiatry in [place] last week I have not had a chance to look at his notes The patient had an extensive vascular work-up including an angiogram in 2019. He was felt to have adequate blood supply on the right at the time although he did have amputations of the left first and second toe by Dr. Oneida Alar. 09/11/19 -Patient is back and is about to finish his Keflex, appointment with Dr. Oneida Alar is in the works. The right great toe wound looks about the same, the left third toe dorsal wound has healed. There was a small eschar covering it that was removed by podiatry.According to patient's daughter the right great toe base on the plantar  surface had some redness that seems to have disappeared. 10/13/2019; patient is back in clinic today after 1 month hiatus although I have not seen him since 5/10. Since he was last here he was admitted to hospital from 6/2 through 6/5 because of a nonhealing wound on his right great toe. He underwent an angiogram which showed patent common femoral, profunda, SFA above and below-knee popliteal artery. The dominant runoff in the right lower extremity was through the anterior tibial but that became very small at the level of the ankle and had minimal outflow into the foot. The peroneal artery was patent but atretic. The posterior tibial flow was much slower in the artery appeared to have multiple focal high-grade stenoses as  well as short chronic total occlusion in the distal segment. Interventions were attempted on the posterior tibial. Mechanical orbital atherectomy down to the level of the ankle and then this was angioplastied with 2.3 mm and 3 mm balloons. Flow remained very sluggish down the posterior tibial artery but was patent. Post event there apparently was embolization of an area involving the plantar left heel and sides of the left heel. He arrives back in clinic today it has been almost 7 weeks since I have seen him. Miraculously the great toe area with open bone is closed over probably as result of Dr. Ainsley Spinner interventions. He does however have an extensive ischemic area on the plantar and medial aspect of his heel. I reviewed Dr. Ainsley Spinner last note from 3 days ago on 6/22. He did not think there was enough inflow to heal the heel wound. With eschar and sloughing. Wondered whether he could heal a below-knee amputation but more likely an above the knee amputation when required. He was placed on Xeroform to the right heel In discussing things with the patient's family and the patient present he is not in a lot of pain. The area does not appear to be grossly infected. The patient appears to be comfortable and conversational. He is eating and drinking fairly well. 7/9; the patient saw Dr. Carlis Abbott on 6/29. He had previously done heroic attempt to establish blood flow as described above. 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 Although the eschar on the heel looks ominous both plantar and medial it is certainly no worse than last week. We will continue Betadine to this area with alginate. Ultimately this will separate off and we will see about the viability of the tissue underneath. Also in clinic today our nurses were washing his right great toe tip apparently some of the skin came off eschared and there is  an open wound at the tip of the great toe with exposed bone similar to what he had previously 7/23; he has the open area on the tip of his first toe which I think is mostly bone. Then he has the embolic area on the plantar heel extending into the medial heel. We have been using Betadine and silver alginate to the heel silver alginate to the toe. His daughter brings up that she was given nitroglycerin patches by Dr. Donzetta Matters to try and open blood flow to the foot. She has not been using them out of concern of systemic hypotension and also that the labels said not to put them on extremities 8/10-Returns at 3 weeks, open area on the tip of his first toe that is dried out, the heel with eschar, applying Betadine to the eschar  area and silver alginate dressing. 9/7; 1 month follow-up. He has an open area on the tip of the toe almost subungual with a thick mycotic nail. The heel wound has black eschar but this is beginning to separate. We have been using Betadine and silver alginate on the heel and simply silver alginate on the right great toe 9/23; the patient was worked in urgently at the request of his wife was concerned about infection in the tip of the toe. Apparently this was brought up by home health. 10/14; the patient has the area of exposed bone on the tip of the right great toe. He also has the eschared area on the plantar heel which was a embolic phenomenon at the time of attempted revascularization. He also had an area of denuded epithelium over both buttocks which I think is probably a combination of pressure friction and moisture. They have been using zinc oxide which is appropriate 10/26. The patient has an area of wider exposed bone at the tip of the right great toe. The substantial eschared area on the heel was already 50% separated I remove this. Actually the patient's wife had called repetitively for urgent appointments with some degree of erythema in the foot as the primary concern. It  turns out that she has been to see her primary doctor after the patient had a fall off the commode resulting in skin tears to both forearms. He was placed on doxycycline because of concern of cellulitis of the toe. She is also been to see [facility] in [place] with regards to the toe. Apparently he shaved the bone and recommended Betadine to this. Noteworthy that the patient already had an extensive arterial evaluation with an angiogram. He has known PAD with tibial vessel disease. The last effort was an attempt to open up the posterior tibial artery which resulted in symptoms of embolization to the heel. 11/9; since the patient was last here he was brought to the ER on 11/4. He was felt to have cellulitis in the right great toe. They did not do a wound culture however they did blood cultures which showed 3 out of 3 Staph hominis. He has been treated with clindamycin 150 mg 3 times times a day. His daughter says he went to the hospital with altered LOC. He had been running a fever but was not febrile on arrival to the ER. Looking over the emergency room notes he had erythema in his foot and I think he had cellulitis. He had apparently been on doxycycline 100 mg twice daily prescribed by Dr. Caprice Beaver prior to this. The staph isolate itself is very resistant it is sensitive to vancomycin but resistant to methicillin's, quinolones, tetracycline and Septra. During the stay in the ER she was seen by vein and vascular they noted that he does not have any further options for revascularization. They do not think that he could undergo any foot conserving surgery therefore the only thing left here would be a BKA The area of the heel that I debrided last time he is here actually looks quite healthy nice healthy looking granulation tissue over perhaps 60 to 70% of the wound still 30% slough we have been using Iodoflex. They have been using silver collagen to the toe The other issue here is the  daughter is struggling with the idea of in-home hospice. I went over this with her. I emphasized that hospice is not a home care agency as a velocity of care at the end of life.  Patient is 87 years old he apparently is eating and drinking less and since I have known him he has become more frail 03/11/2020 upon evaluation today patient unfortunately is showing signs of progression in regard to his right great toe. Currently he is actually experiencing increased evidence of skin deterioration and in fact this toe is developing into more of a dry gangrene issue at this point. He was signed up for hospice on Thursday and then apparently hospice was called off on Friday due to the fact that they were talking about doing wet-to-dry dressings to both wound locations and his daughter was really concerned about this. She did not want anything to make the wounds progress faster than they should be. With that being said I think a wet-to-dry dressing would be okay for the heel location. I do not believe this would cause any harm. With that being said and in regard to the toe I really do agree I would not really want a wet-to-dry dressing here I think more of like Betadine and dry gauze dressing could be a appropriate way to go after the toe was painted with Betadine that is. 12/9. It has been a month since I saw this man. He was using Betadine to the great toe and Iodoflex to the area on the heel. The heel actually look quite good last time I saw this this is gotten larger and not nearly as healthy looking as last time. Moreover the right first toe wound now has dry gangrene Since they were last here they have not elected to sign up for hospice. They do not have home health. They are trying to get some home care through [facility] 1/13; the patient's right first toe is mummified to the inter phalangeal joint. The area on the plantar aspect of his right heel actually measures quite a bit better. We have been using  Iodoflex His daughter points out an area on the plantar aspect of the left 5th toe at roughly the level of the proximal inner phalangeal 2/10; right first toe remains mummified there is no change in this no evidence of infection no erythema spreading proximally into the foot ooHe still has the area on the plantar aspect of his right heel. This requires a debridement. We have been using silver alginate ooHe had an area last time at the base of his left fifth toe. This is healed however he has a new area on the medial part of the left fifth toe. This also required debridement. We have been using silver alginate largely to all wound areas 06/27/2020 upon evaluation today patient continues to hang onto the great toe. This does seem to be fairly stable and dry although the patient's daughter notes there has been a little bit of drainage here. Fortunately there is no signs of active infection at this time. No fevers, chills, nausea, vomiting, or diarrhea. Overall very pleased with the fact that the patient seems to be hanging on and doing quite well. The heel is healing quite nicely. With that being said I think the toe eventually will self amputate. 3/22; this is a patient that I have not seen in a while although he was seen here 10 days ago. His daughter called to work him in today due to erythema around the mom mummified great toe concern for cellulitis. He also comes in today with complaints of a skin tear on the left forearm possibly an early paronychia a on the left fourth finger 3/29; the erythema  surrounding his mummified right first toe is a lot better. My marks on the metatarsal head now have fairly decent looking skin. It would appear that the eschar itself is receding. The area on his left forearm a skin tear last week and the area on the right plantar heel which was an embolic wound at the time of attempts at revascularization have all closed over. Objective Constitutional Sitting or  standing Blood Pressure is within target range for patient.. Pulse regular and within target range for patient.Marland Kitchen Respirations regular, non-labored and within target range.. Temperature is normal and within the target range for the patient.Marland Kitchen Appears in no distress. Vitals Time Taken: 2:24 PM, Height: 70 in, Weight: 140 lbs, BMI: 20.1, Temperature: 97.8 F, Pulse: 90 bpm, Respiratory Rate: 17 breaths/min, Blood Pressure: 133/76 mmHg. General Notes: Wound exam; the patient's right first mummified toe actually looks somewhat better to the interphalangeal joint I wonder whether the more proximal area of is salvageable. The infection from last week which I treated with clindamycin seems to have done very well. ooPrep miraculously the right plantar heel wound is closed as is the left forearm that they were concerned about which was a skin tear from last week Integumentary (Hair, Skin) Wound #2 status is Open. Original cause of wound was Gradually Appeared. The date acquired was: 06/15/2019. The wound has been in treatment 51 weeks. The wound is located on the Right T Great. The wound measures 4cm length x 8cm width x 0.1cm depth; 25.133cm^2 area and 2.513cm^3 volume. There is oe no tunneling or undermining noted. There is a none present amount of drainage noted. The wound margin is distinct with the outline attached to the wound base. There is no granulation within the wound bed. There is a large (67-100%) amount of necrotic tissue within the wound bed including Eschar. Wound #3 status is Open. Original cause of wound was Gradually Appeared. The date acquired was: 09/21/2019. The wound has been in treatment 39 weeks. The wound is located on the Right Calcaneus. The wound measures 0cm length x 0cm width x 0cm depth; 0cm^2 area and 0cm^3 volume. There is no tunneling or undermining noted. There is a none present amount of drainage noted. The wound margin is flat and intact. There is no granulation within the  wound bed. There is no necrotic tissue within the wound bed. General Notes: callous, dry skin Wound #7 status is Healed - Epithelialized. Original cause of wound was Skin Tear/Laceration. The date acquired was: 07/05/2020. The wound has been in treatment 1 weeks. The wound is located on the Left Forearm. The wound measures 0cm length x 0cm width x 0cm depth; 0cm^2 area and 0cm^3 volume. There is no tunneling or undermining noted. There is a none present amount of drainage noted. The wound margin is flat and intact. There is no granulation within the wound bed. There is no necrotic tissue within the wound bed. General Notes: scabbed Assessment Active Problems ICD-10 Type 2 diabetes mellitus with foot ulcer Non-pressure chronic ulcer of other part of right foot with necrosis of bone Type 2 diabetes mellitus with diabetic neuropathy, unspecified Non-pressure chronic ulcer of right heel and midfoot with fat layer exposed Abrasion of left upper arm, subsequent encounter Plan Follow-up Appointments: Return appointment in 1 month. Bathing/ Shower/ Hygiene: May shower and wash wound with soap and water. - with dressing changes Edema Control - Lymphedema / SCD / Other: Elevate legs to the level of the heart or above for 30 minutes daily  and/or when sitting, a frequency of: Avoid standing for long periods of time. Moisturize legs daily. Additional Orders / Instructions: Other: - Apply Desitin Zinc oxide redness, excoriated areas to buttock area daily and as needed for protection. Home Health: New wound care orders this week; continue Home Health for wound care. May utilize formulary equivalent dressing for wound treatment orders unless otherwise specified. - Advance home health to monitor weekly of right great toe. WOUND #2: - T Great Wound Laterality: Right oe Cleanser: Soap and Water (Home Health) 1 x Per Day/30 Days Discharge Instructions: May shower and wash wound with dial antibacterial  soap and water prior to dressing change. Prim Dressing: betadine (Home Health) 1 x Per Day/30 Days ary Secondary Dressing: Woven Gauze Sponge, Non-Sterile 4x4 in (Home Health) 1 x Per Day/30 Days Discharge Instructions: Apply over primary dressing as directed. Secured With: [employer], 4.5x3.1 (in/yd) (Generic) 1 x Per Day/30 Days Discharge Instructions: Secure with Kerlix as directed. Secured With: Paper T ape, 2x10 (in/yd) (Generic) 1 x Per Day/30 Days Discharge Instructions: Secure dressing with tape as directed. 1. We will continue with Betadine to the right great toe 2 I think his antibiotics can complete 3. He will require a foam padding to the heel wound which is healed but has very little fat pad remaining. He would not be able to handle a lot of pressure in this area 0.4 Electronic Signature(s) Signed: 07/18/2020 7:47:56 AM By: Linton Ham MD Entered By: Linton Ham on 07/16/2020 15:17:23 -------------------------------------------------------------------------------- SuperBill Details Patient Name: Date of Service: [name] 07/16/2020 Medical Record Number: [id number] Patient Account Number: [id number] Date of Birth/Sex: Treating RN: Sep 01, 1923 (87 y.o. Burnadette Pop, Lauren Primary Care Provider: Sherrie Mustache Other Clinician: Referring Provider: Treating Provider/Extender: Deloria Lair in Treatment: 51 Diagnosis Coding ICD-10 Codes Code Description E11.621 Type 2 diabetes mellitus with foot ulcer L97.514 Non-pressure chronic ulcer of other part of right foot with necrosis of bone E11.40 Type 2 diabetes mellitus with diabetic neuropathy, unspecified L97.412 Non-pressure chronic ulcer of right heel and midfoot with fat layer exposed L97.521 Non-pressure chronic ulcer of other part of left foot limited to breakdown of skin Facility Procedures CPT4 Code: 11941740 Description: 99214 - WOUND CARE VISIT-LEV 4 EST  PT Modifier: Quantity: 1 Physician Procedures : CPT4 Code Description Modifier [id number] - WC PHYS LEVEL 3 - EST PT ICD-10 Diagnosis Description L97.514 Non-pressure chronic ulcer of other part of right foot with necrosis of bone E11.621 Type 2 diabetes mellitus with foot ulcer Quantity: 1 Electronic Signature(s) Signed: 07/18/2020 7:47:56 AM By: Linton Ham MD Entered By: Linton Ham on 07/16/2020 15:17:45

## 2020-07-18 NOTE — Progress Notes (Signed)
[name], [name] ([id number]) Visit Report for 07/16/2020 Arrival Information Details Patient Name: Date of Service: [name] [place] E. 07/16/2020 2:00 PM Medical Record Number: [id number] Patient Account Number: [id number] Date of Birth/Sex: Treating RN: Feb 09, 1924 (87 y.o. Burnadette Pop, Lauren Primary Care Navi Erber: Sherrie Mustache Other Clinician: Referring Yaiden Yang: Treating Ronald Londo/Extender: Deloria Lair in Treatment: 23 Visit Information History Since Last Visit Added or deleted any medications: No Patient Arrived: Wheel Chair Any new allergies or adverse reactions: No Arrival Time: 14:23 Had a fall or experienced change in No Accompanied By: daughter activities of daily living that may affect Transfer Assistance: None risk of falls: Patient Identification Verified: Yes Signs or symptoms of abuse/neglect since last visito No Secondary Verification Process Completed: Yes Hospitalized since last visit: No Patient Requires Transmission-Based Precautions: No Implantable device outside of the clinic excluding No Patient Has Alerts: Yes cellular tissue based products placed in the center Patient Alerts: R ABI non compressible since last visit: Has Dressing in Place as Prescribed: Yes Pain Present Now: No Electronic Signature(s) Signed: 07/17/2020 9:05:34 AM By: Sandre Kitty Entered By: Sandre Kitty on 07/16/2020 14:24:08 -------------------------------------------------------------------------------- Clinic Level of Care Assessment Details Patient Name: Date of Service: [place] [name] Fuchs E. 07/16/2020 2:00 PM Medical Record Number: [id number] Patient Account Number: [id number] Date of Birth/Sex: Treating RN: Jul 22, 1923 (87 y.o. Burnadette Pop, Lauren Primary Care Phiona Ramnauth: Sherrie Mustache Other Clinician: Referring Aydden Cumpian: Treating Anaily Ashbaugh/Extender: Deloria Lair in Treatment: 51 Clinic Level of Care Assessment  Items TOOL 4 Quantity Score X- 1 0 Use when only an EandM is performed on FOLLOW-UP visit ASSESSMENTS - Nursing Assessment / Reassessment X- 1 10 Reassessment of Co-morbidities (includes updates in patient status) X- 1 5 Reassessment of Adherence to Treatment Plan ASSESSMENTS - Wound and Skin A ssessment / Reassessment X - Simple Wound Assessment / Reassessment - one wound 1 5 []  - 0 Complex Wound Assessment / Reassessment - multiple wounds X- 1 10 Dermatologic / Skin Assessment (not related to wound area) ASSESSMENTS - Focused Assessment X- 1 5 Circumferential Edema Measurements - multi extremities X- 1 10 Nutritional Assessment / Counseling / Intervention []  - 0 Lower Extremity Assessment (monofilament, tuning fork, pulses) []  - 0 Peripheral Arterial Disease Assessment (using hand held doppler) ASSESSMENTS - Ostomy and/or Continence Assessment and Care []  - 0 Incontinence Assessment and Management []  - 0 Ostomy Care Assessment and Management (repouching, etc.) PROCESS - Coordination of Care X - Simple Patient / Family Education for ongoing care 1 15 []  - 0 Complex (extensive) Patient / Family Education for ongoing care X- 1 10 Staff obtains Programmer, systems, Records, T Results / Process Orders est X- 1 10 Staff telephones HHA, Nursing Homes / Clarify orders / etc []  - 0 Routine Transfer to another Facility (non-emergent condition) []  - 0 Routine Hospital Admission (non-emergent condition) []  - 0 New Admissions / Biomedical engineer / Ordering NPWT Apligraf, etc. , []  - 0 Emergency Hospital Admission (emergent condition) X- 1 10 Simple Discharge Coordination []  - 0 Complex (extensive) Discharge Coordination PROCESS - Special Needs []  - 0 Pediatric / Minor Patient Management []  - 0 Isolation Patient Management []  - 0 Hearing / Language / Visual special needs []  - 0 Assessment of Community assistance (transportation, D/C planning, etc.) []  - 0 Additional  assistance / Altered mentation []  - 0 Support Surface(s) Assessment (bed, cushion, seat, etc.) INTERVENTIONS - Wound Cleansing / Measurement X - Simple Wound Cleansing - one wound 1 5 []  -  0 Complex Wound Cleansing - multiple wounds X- 1 5 Wound Imaging (photographs - any number of wounds) []  - 0 Wound Tracing (instead of photographs) X- 1 5 Simple Wound Measurement - one wound []  - 0 Complex Wound Measurement - multiple wounds INTERVENTIONS - Wound Dressings X - Small Wound Dressing one or multiple wounds 1 10 []  - 0 Medium Wound Dressing one or multiple wounds []  - 0 Large Wound Dressing one or multiple wounds []  - 0 Application of Medications - topical []  - 0 Application of Medications - injection INTERVENTIONS - Miscellaneous []  - 0 External ear exam []  - 0 Specimen Collection (cultures, biopsies, blood, body fluids, etc.) []  - 0 Specimen(s) / Culture(s) sent or taken to Lab for analysis []  - 0 Patient Transfer (multiple staff / Civil Service fast streamer / Similar devices) []  - 0 Simple Staple / Suture removal (25 or less) []  - 0 Complex Staple / Suture removal (26 or more) []  - 0 Hypo / Hyperglycemic Management (close monitor of Blood Glucose) []  - 0 Ankle / Brachial Index (ABI) - do not check if billed separately X- 1 5 Vital Signs Has the patient been seen at the hospital within the last three years: Yes Total Score: 120 Level Of Care: New/Established - Level 4 Electronic Signature(s) Signed: 07/16/2020 5:10:40 PM By: Rhae Hammock RN Entered By: Rhae Hammock on 07/16/2020 15:03:48 -------------------------------------------------------------------------------- Encounter Discharge Information Details Patient Name: Date of Service: [name] [place] E. 07/16/2020 2:00 PM Medical Record Number: [id number] Patient Account Number: [id number] Date of Birth/Sex: Treating RN: [birth year] (87 y.o. Marcheta Grammes Primary Care Shamel Germond: Sherrie Mustache Other  Clinician: Referring Jefrey Raburn: Treating Jeronimo Hellberg/Extender: Deloria Lair in Treatment: 28 Encounter Discharge Information Items Discharge Condition: Stable Ambulatory Status: Wheelchair Discharge Destination: Home Transportation: Private Auto Accompanied By: Daughter Schedule Follow-up Appointment: Yes Clinical Summary of Care: Provided on 07/16/2020 Form Type Recipient Paper Patient Patient Electronic Signature(s) Signed: 07/16/2020 3:19:03 PM By: Lorrin Jackson Entered By: Lorrin Jackson on 07/16/2020 15:19:03 -------------------------------------------------------------------------------- Lower Extremity Assessment Details Patient Name: Date of Service: [place] [name] Fuchs E. 07/16/2020 2:00 PM Medical Record Number: [id number] Patient Account Number: [id number] Date of Birth/Sex: Treating RN: [birth year] (87 y.o. [name] Primary Care Caylen Kuwahara: Sherrie Mustache Other Clinician: Referring Mollyann Halbert: Treating Marisa Hufstetler/Extender: Deloria Lair in Treatment: 51 Edema Assessment Assessed: Shirlyn Goltz: No] Patrice Paradise: No] Edema: [Left: N] [Right: o] Calf Left: Right: Point of Measurement: From Medial Instep 25.8 cm Ankle Left: Right: Point of Measurement: From Medial Instep 21.3 cm Vascular Assessment Pulses: Dorsalis Pedis Palpable: [Right:Yes] Electronic Signature(s) Signed: 07/16/2020 5:09:29 PM By: Baruch Gouty RN, BSN Entered By: Baruch Gouty on 07/16/2020 14:44:31 -------------------------------------------------------------------------------- Multi Wound Chart Details Patient Name: Date of Service: [place] [name] Fuchs E. 07/16/2020 2:00 PM Medical Record Number: [id number] Patient Account Number: [id number] Date of Birth/Sex: Treating RN: 06/04/1923 (87 y.o. Burnadette Pop, Lauren Primary Care Alaura Schippers: Sherrie Mustache Other Clinician: Referring Anagabriela Jokerst: Treating Bralynn Velador/Extender: Deloria Lair in Treatment: 40 Vital Signs Height(in): 70 Pulse(bpm): 38 Weight(lbs): 140 Blood Pressure(mmHg): 133/76 Body Mass Index(BMI): 20 Temperature(F): 97.8 Respiratory Rate(breaths/min): 17 Photos: [2:No Photos Right T Great oe] [3:No Photos Right Calcaneus] [7:No Photos Left Forearm] Wound Location: [2:Gradually Appeared] [3:Gradually Appeared] [7:Skin Tear/Laceration] Wounding Event: [2:Diabetic Wound/Ulcer of the Lower] [3:Pressure Ulcer] [7:Skin Tear] Primary Etiology: [2:Extremity N/A] [3:Arterial Insufficiency Ulcer] [7:N/A] Secondary Etiology: [2:Cataracts, Arrhythmia, Hypertension,] [3:Cataracts, Arrhythmia, Hypertension,] Comorbid History: [2:Peripheral Arterial Disease, Type II Diabetes, Osteomyelitis, Dementia, Neuropathy, Received Chemotherapy  06/15/2019] [3:Peripheral Arterial Disease, Type II Diabetes, Osteomyelitis, Dementia, Neuropathy, Received  ChemotherapyNeuropathy, Received Chemotherapy 09/21/2019] [7:07/05/2020] Date Acquired: [2:51] [3:39] [7:1] Weeks of Treatment: [2:Open] [3:Open] [7:Healed - Epithelialized] Wound Status: [2:4x8x0.1] [3:0x0x0] [7:0x0x0] Measurements L x W x D (cm) [2:25.133] [3:0] [7:0] A (cm) : rea [2:2.513] [3:0] [7:0] Volume (cm) : [2:-26637.20%] [3:100.00%] [7:100.00%] % Reduction in Area: [2:-8875.00%] [3:100.00%] [7:100.00%] % Reduction in Volume: [2:Grade 4] [3:Unstageable/Unclassified] [7:Full Thickness Without Exposed] Classification: [2:None Present] [3:None Present] [7:Support Structures None Present] Exudate A mount: [2:Distinct, outline attached] [3:Flat and Intact] [7:Flat and Intact] Wound Margin: [2:None Present (0%)] [3:None Present (0%)] [7:None Present (0%)] Granulation Amount: [2:Large (67-100%)] [3:None Present (0%)] [7:None Present (0%)] Necrotic Amount: [2:Eschar] [3:N/A] [7:N/A] Necrotic Tissue: [2:Fascia: No] [3:Fascia: No] [7:Fascia: No] Exposed Structures: [2:Fat Layer (Subcutaneous Tissue): No Fat Layer  (Subcutaneous Tissue): No Fat Layer (Subcutaneous Tissue): No Tendon: No Muscle: No Joint: No Bone: No Small (1-33%)] [3:Tendon: No Muscle: No Joint: No Bone: No Large (67-100%)] [7:Tendon: No Muscle: No  Joint: No Bone: No Large (67-100%)] Epithelialization: [2:N/A] [3:callous, dry skin] [7:scabbed] Treatment Notes Electronic Signature(s) Signed: 07/16/2020 5:10:40 PM By: Rhae Hammock RN Signed: 07/18/2020 7:47:56 AM By: Linton Ham MD Entered By: Linton Ham on 07/16/2020 15:11:28 -------------------------------------------------------------------------------- Multi-Disciplinary Care Plan Details Patient Name: Date of Service: [name], [name] E. 07/16/2020 2:00 PM Medical Record Number: [id number] Patient Account Number: [id number] Date of Birth/Sex: Treating RN: Sep 01, 1923 (87 y.o. Burnadette Pop, Lauren Primary Care Lyanna Blystone: Sherrie Mustache Other Clinician: Referring Willean Schurman: Treating Karel Turpen/Extender: Deloria Lair in Treatment: 46 [place] reviewed with physician Active Inactive Wound/Skin Impairment Nursing Diagnoses: Knowledge deficit related to ulceration/compromised skin integrity Goals: Patient/caregiver will verbalize understanding of skin care regimen Date Initiated: 07/25/2019 Target Resolution Date: 09/06/2020 Goal Status: Active Ulcer/skin breakdown will have a volume reduction of 30% by week 4 Date Initiated: 07/25/2019 Date Inactivated: 08/28/2019 Target Resolution Date: 08/25/2019 Goal Status: Unmet Unmet Reason: Osteomyelitis Interventions: Assess patient/caregiver ability to obtain necessary supplies Assess patient/caregiver ability to perform ulcer/skin care regimen upon admission and as needed Assess ulceration(s) every visit Notes: Electronic Signature(s) Signed: 07/16/2020 5:10:40 PM By: Rhae Hammock RN Entered By: Rhae Hammock on 07/16/2020  15:02:44 -------------------------------------------------------------------------------- Pain Assessment Details Patient Name: Date of Service: [place] [name] Fuchs E. 07/16/2020 2:00 PM Medical Record Number: [id number] Patient Account Number: [id number] Date of Birth/Sex: Treating RN: [birth year] (87 y.o. Burnadette Pop, Lauren Primary Care Hamdan Toscano: Sherrie Mustache Other Clinician: Referring Ashaad Gaertner: Treating Arden Tinoco/Extender: Deloria Lair in Treatment: 67 Active Problems Location of Pain Severity and Description of Pain Patient Has Paino No Site Locations Pain Management and Medication Current Pain Management: Electronic Signature(s) Signed: 07/16/2020 5:10:40 PM By: Rhae Hammock RN Signed: 07/17/2020 9:05:34 AM By: Sandre Kitty Entered By: Sandre Kitty on 07/16/2020 14:24:34 -------------------------------------------------------------------------------- Patient/Caregiver Education Details Patient Name: Date of Service: [name] 3/29/2022andnbsp2:00 PM Medical Record Number: [id number] Patient Account Number: [id number] Date of Birth/Gender: Treating RN: 17-Mar-1924 (87 y.o. [name] Primary Care Physician: Sherrie Mustache Other Clinician: Referring Physician: Treating Physician/Extender: Deloria Lair in Treatment: 104 Education Assessment Education Provided To: Patient Education Topics Provided Wound/Skin Impairment: Methods: Explain/Verbal Responses: State content correctly [employer]) Signed: 07/16/2020 5:10:40 PM By: Rhae Hammock RN Entered By: Rhae Hammock on 07/16/2020 15:03:05 -------------------------------------------------------------------------------- Wound Assessment Details Patient Name: Date of Service: [place] [name] Fuchs E. 07/16/2020 2:00 PM Medical Record Number: [id number] Patient Account Number: [id number] Date of Birth/Sex: Treating  RN:  09/29/1923 (87 y.o. Burnadette Pop, Lauren Primary Care Chrystle Murillo: Sherrie Mustache Other Clinician: Referring Rylie Knierim: Treating Kassadee Carawan/Extender: Deloria Lair in Treatment: 20 Wound Status Wound Number: 2 Primary Diabetic Wound/Ulcer of the Lower Extremity Etiology: Wound Location: Right T Great oe Wound Open Wounding Event: Gradually Appeared Status: Date Acquired: 06/15/2019 Comorbid Cataracts, Arrhythmia, Hypertension, Peripheral Arterial Disease, Weeks Of Treatment: 51 History: Type II Diabetes, Osteomyelitis, Dementia, Neuropathy, Received Clustered Wound: No Chemotherapy Photos Wound Measurements Length: (cm) 4 Width: (cm) 8 Depth: (cm) 0.1 Area: (cm) 25.133 Volume: (cm) 2.513 % Reduction in Area: -26637.2% % Reduction in Volume: -8875% Epithelialization: Small (1-33%) Tunneling: No Undermining: No Wound Description Classification: Grade 4 Wound Margin: Distinct, outline attached Exudate Amount: None Present Foul Odor After Cleansing: No Slough/Fibrino No Wound Bed Granulation Amount: None Present (0%) Exposed Structure Necrotic Amount: Large (67-100%) Fascia Exposed: No Necrotic Quality: Eschar Fat Layer (Subcutaneous Tissue) Exposed: No Tendon Exposed: No Muscle Exposed: No Joint Exposed: No Bone Exposed: No Treatment Notes Wound #2 (Toe Great) Wound Laterality: Right Cleanser Soap and Water Discharge Instruction: May shower and wash wound with dial antibacterial soap and water prior to dressing change. Peri-Wound Care Topical Primary Dressing betadine Secondary Dressing Woven Gauze Sponge, Non-Sterile 4x4 in Discharge Instruction: Apply over primary dressing as directed. Secured With [employer], 4.5x3.1 (in/yd) Discharge Instruction: Secure with Kerlix as directed. Paper Tape, 2x10 (in/yd) Discharge Instruction: Secure dressing with tape as directed. Compression Wrap Compression  Stockings Add-Ons Electronic Signature(s) Signed: 07/16/2020 5:10:40 PM By: Rhae Hammock RN Signed: 07/17/2020 9:05:34 AM By: Sandre Kitty Entered By: Sandre Kitty on 07/16/2020 16:47:37 -------------------------------------------------------------------------------- Wound Assessment Details Patient Name: Date of Service: [place] [name] Fuchs E. 07/16/2020 2:00 PM Medical Record Number: [id number] Patient Account Number: [id number] Date of Birth/Sex: Treating RN: 1923-11-15 (87 y.o. Burnadette Pop, Lauren Primary Care Audia Amick: Sherrie Mustache Other Clinician: Referring Lissy Deuser: Treating Nina Mondor/Extender: Deloria Lair in Treatment: 11 Wound Status Wound Number: 3 Primary Pressure Ulcer Etiology: Wound Location: Right Calcaneus Secondary Arterial Insufficiency Ulcer Wounding Event: Gradually Appeared Etiology: Date Acquired: 09/21/2019 Wound Open Weeks Of Treatment: 39 Status: Clustered Wound: No Comorbid Cataracts, Arrhythmia, Hypertension, Peripheral Arterial Disease, History: Type II Diabetes, Osteomyelitis, Dementia, Neuropathy, Received Chemotherapy Wound Measurements Length: (cm) Width: (cm) Depth: (cm) Area: (cm) Volume: (cm) 0 % Reduction in Area: 100% 0 % Reduction in Volume: 100% 0 Epithelialization: Large (67-100%) 0 Tunneling: No 0 Undermining: No Wound Description Classification: Unstageable/Unclassified Wound Margin: Flat and Intact Exudate Amount: None Present Foul Odor After Cleansing: No Slough/Fibrino No Wound Bed Granulation Amount: None Present (0%) Exposed Structure Necrotic Amount: None Present (0%) Fascia Exposed: No Fat Layer (Subcutaneous Tissue) Exposed: No Tendon Exposed: No Muscle Exposed: No Joint Exposed: No Bone Exposed: No Assessment Notes callous, dry skin Electronic Signature(s) Signed: 07/16/2020 5:09:29 PM By: Baruch Gouty RN, BSN Signed: 07/16/2020 5:10:40 PM By: Rhae Hammock  RN Entered By: Baruch Gouty on 07/16/2020 14:46:06 -------------------------------------------------------------------------------- Wound Assessment Details Patient Name: Date of Service: MA [name] Fuchs E. 07/16/2020 2:00 PM Medical Record Number: [id number] Patient Account Number: [id number] Date of Birth/Sex: Treating RN: [birth year] (87 y.o. [name] Primary Care Cherylene Ferrufino: Sherrie Mustache Other Clinician: Referring Yuji Walth: Treating Garron Eline/Extender: Deloria Lair in Treatment: 25 Wound Status Wound Number: 7 Primary Skin Tear Etiology: Wound Location: Left Forearm Wound Healed - Epithelialized Wounding Event: Skin Tear/Laceration Status: Date Acquired: 07/05/2020 Comorbid Cataracts, Arrhythmia, Hypertension, Peripheral Arterial Disease, Weeks Of Treatment: 1 History: Type II Diabetes,  Osteomyelitis, Dementia, Neuropathy, Received Clustered Wound: No Chemotherapy Wound Measurements Length: (cm) Width: (cm) Depth: (cm) Area: (cm) Volume: (cm) 0 % Reduction in Area: 100% 0 % Reduction in Volume: 100% 0 Epithelialization: Large (67-100%) 0 Tunneling: No 0 Undermining: No Wound Description Classification: Full Thickness Without Exposed Support Structures Wound Margin: Flat and Intact Exudate Amount: None Present Foul Odor After Cleansing: No Slough/Fibrino No Wound Bed Granulation Amount: None Present (0%) Exposed Structure Necrotic Amount: None Present (0%) Fascia Exposed: No Fat Layer (Subcutaneous Tissue) Exposed: No Tendon Exposed: No Muscle Exposed: No Joint Exposed: No Bone Exposed: No Assessment Notes scabbed Treatment Notes Wound #7 (Forearm) Wound Laterality: Left Cleanser Peri-Wound Care Topical Primary Dressing Secondary Dressing Secured With Compression Wrap Compression Stockings Add-Ons Electronic Signature(s) Signed: 07/16/2020 5:10:40 PM By: Rhae Hammock RN Entered By: Rhae Hammock on  07/16/2020 14:57:23 -------------------------------------------------------------------------------- Vitals Details Patient Name: Date of Service: [name] [place] E. 07/16/2020 2:00 PM Medical Record Number: [id number] Patient Account Number: [id number] Date of Birth/Sex: Treating RN: 10/08/1923 (87 y.o. Burnadette Pop, Lauren Primary Care Jedrick Hutcherson: Sherrie Mustache Other Clinician: Referring Shyteria Lewis: Treating Avram Danielson/Extender: Deloria Lair in Treatment: 51 Vital Signs Time Taken: 14:24 Temperature (F): 97.8 Height (in): 70 Pulse (bpm): 90 Weight (lbs): 140 Respiratory Rate (breaths/min): 17 Body Mass Index (BMI): 20.1 Blood Pressure (mmHg): 133/76 Reference Range: 80 - 120 mg / dl Electronic Signature(s) Signed: 07/17/2020 9:05:34 AM By: Sandre Kitty Entered By: Sandre Kitty on 07/16/2020 14:24:26

## 2020-07-22 ENCOUNTER — Other Ambulatory Visit: Payer: Self-pay

## 2020-07-22 ENCOUNTER — Ambulatory Visit (INDEPENDENT_AMBULATORY_CARE_PROVIDER_SITE_OTHER): Payer: Medicare Other | Admitting: Gastroenterology

## 2020-07-22 ENCOUNTER — Encounter (INDEPENDENT_AMBULATORY_CARE_PROVIDER_SITE_OTHER): Payer: Self-pay | Admitting: Gastroenterology

## 2020-07-22 VITALS — BP 90/59 | HR 81 | Temp 98.2°F | Ht 70.0 in

## 2020-07-22 DIAGNOSIS — R1319 Other dysphagia: Secondary | ICD-10-CM | POA: Diagnosis not present

## 2020-07-22 NOTE — Progress Notes (Signed)
[name], M.D. Gastroenterology & Hepatology [facility] [address] [place], [place] [address]  Primary Care Physician: [name] Chandler, NP [name] [id number]  I will communicate my assessment and recommendations to the referring MD via EMR.  Problems: 1. Dysphagia 2. Recurrent esophageal strictures, likely peptic  History of Present Illness: [name] is a 87 y.o. male hx of A. fib on Eliquis, dementia, diabetes, GIST tumor who presents for follow up after recently being evaluated in the ER for self-limited episode of  food impaction.  Patient underwent EGD on 07/07/2018, at that time he was found to have abnormal motility throughout his esophagus.  And nonobstructive Schatzki's ring was found in the GE junction which was dilated initially with a balloon up to 18 mm but as there was no disruption of this, the ring was disrupted with a cold forceps.  There was presence of a 4 cm hiatal hernia.  Normal stomach and duodenum.  Patient comes with his daughter and caregiver.  They report that the patient had trouble eating a week before his symptoms presented acutely as he was having some complaints of dysphagia.  He mostly eats a soft diet, Ensure and protein powder as he has had dysphagia chronically but his diet has allowed him to eat without having major weight loss.  The patient denies having any nausea, vomiting, fever, chills, hematochezia, melena, hematemesis, abdominal distention, abdominal pain, diarrhea, jaundice, pruritus or weight loss.  Patient went to the ER at [name] on 07/03/2020..  That time he underwent a CT of the chest and neck which showed presence of distended esophagus with air and contents in the mid and distal esophagus with suspicion of dysmotility.  His symptoms completely resolved after he took GI cocktail including glucagon and the patient was discharged home.  Last  EGD: 2017 - passing 54 French Maloney dilator. - Small diverticulum in the lower third of the  esophagus with these of food debris. - Benign-appearing esophageal stenosis distal to  diverticulum dilated by passing 54 French Maloney  dilator. - Mild Schatzki ring. Dilated an disrupted by  passing 64 [place] Maloney dilator. - 3 cm hiatal hernia. - Portal hypertensive gastropathy. - A few gastric polyps. - Normal duodenal bulb and second portion of the  duodenum. Last Colonoscopy:  Past Medical History: Past Medical History:  Diagnosis Date  . Acute bronchitis   . Acute lower UTI 11/25/2017  . Amputation of toe of left foot ([place]) 07/30/2017, 08/27/2017    Great toe 07/30/2017 Second toe 08/27/2017   . Arthritis   . Aspiration pneumonia ([place]) 01/31/2018  . Atrial fibrillation ([place])    Dr. Harl Bowie- [facility] follows saw 11'14  . Bladder stones    tx. with oral meds and antibiotics, now surgery planned  . Bone metastases ([place]) 08/21/2015  . Cataracts, both eyes    surgery planned May 2015  . Community acquired pneumonia 04/16/2018  . Cystoid macular edema 04/21/2015    Right eye 04/21/2015   . Dehydration 04/16/2018  . Dementia without behavioral disturbance ([place]) 04/16/2018  . Diabetes mellitus without complication ([place])    Type II  . Diarrhea 11/25/2017  . Dyspnea    with activity  . Dysrhythmia    Afib  . Family history of breast cancer   . Family history of colon cancer   . Genetic testing 02/09/2017  . GERD (gastroesophageal reflux disease)   . 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 Margot Chimes, thus far without recurrence.   Marland Kitchen History of MRI of lumbar spine 03/05/2014  . Hypertension   . Hypertrophy of prostate with urinary obstruction   . Hypomagnesemia 03/16/2017  . Metabolic encephalopathy 77/82/4235  . Metastatic adenocarcinoma to prostate ([place]) 09/08/2013  . 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 NHL (non-Hodgkin's lymphoma) ([place]) 2005   Diffuse large B-cell lymphoma, clinically stage IIIA, CD20 positive, status post cervical lymph node biopsy 08/03/2003 on the left. PET scan was also positive in the spleen and small bowel region, but bone marrow aspiration and biopsy were negative. So he essentially had stage IIIAs. He received R-CHOP x6 cycles with CR established by PET scan criteria on 11/23/2003 with no evidence for relapse th  . Numbness 02/19/2017   Per [facility] new patient packet  . Osteomyelitis ([employer]) 06/29/2017   toe of left foot   . PAD (peripheral artery disease) ([place]) 08/27/2017  . Polyneuropathy 02/19/2017  . Postural dizziness with presyncope 02/19/2017  . S/P TURP 08/10/2013  . Sepsis ([place]) 07/02/2018  . Skin cancer    Basal cell- face,head, neck,  arms, legs, Back  . Urinary frequency 02/19/2017    Past Surgical History: Past Surgical History:  Procedure Laterality Date  . ABDOMINAL AORTOGRAM W/LOWER EXTREMITY N/A 09/20/2019   Procedure: ABDOMINAL AORTOGRAM W/LOWER EXTREMITY;  Surgeon: Marty Heck, MD;  Location: [facility];  Service: Cardiovascular;  Laterality: N/A;  . AMPUTATION Left 06/29/2017   Procedure: AMPUTATION LEFT GREAT TOE;  Surgeon: [name], MD;  Location: [facility];  Service: Vascular;  Laterality: Left;  . AMPUTATION Left 08/27/2017   Procedure: AMPUTATION Left SECOND TOE;  Surgeon: [name], MD;  Location: [facility];  Service: Vascular;   Laterality: Left;  . CATARACT EXTRACTION Bilateral 2015  . CHOLECYSTECTOMY    . COLON RESECTION     small bowel  . CYSTOSCOPY WITH LITHOLAPAXY N/A 08/10/2013   Procedure: CYSTOSCOPY WITH LITHOLAPAXY WITH [name];  Surgeon: [name], MD;  Location: [facility];  Service: Urology;  Laterality: N/A;  . ESOPHAGEAL DILATION N/A 02/27/2016   Procedure: ESOPHAGEAL DILATION;  Surgeon: [name], MD;  [facility];  Service: Endoscopy;  Laterality: N/A;  . ESOPHAGEAL DILATION N/A 07/07/2018   Procedure: ESOPHAGEAL DILATION;  Surgeon: [name], MD;  [facility];  Service: Endoscopy;  Laterality: N/A;  . ESOPHAGOGASTRODUODENOSCOPY N/A 02/27/2016   Procedure: ESOPHAGOGASTRODUODENOSCOPY (EGD);  Surgeon: [name], MD;  [facility];  Service: Endoscopy;  Laterality: N/A;  12:00  . ESOPHAGOGASTRODUODENOSCOPY N/A 07/07/2018   Procedure: ESOPHAGOGASTRODUODENOSCOPY (EGD);  Surgeon: [name], MD;  [facility];  Service: Endoscopy;  Laterality: N/A;  . INCISION AND DRAINAGE PERIRECTAL ABSCESS    . LOWER EXTREMITY ANGIOGRAPHY N/A 06/25/2017   Procedure: LOWER EXTREMITY ANGIOGRAPHY;  Surgeon: [name], MD;  Location: [facility];  Service: Cardiovascular;  Laterality: N/A;  . LYMPH NODE DISSECTION Left    '05-neck  . PERIPHERAL VASCULAR ATHERECTOMY Right 09/20/2019   Procedure: PERIPHERAL VASCULAR ATHERECTOMY;  Surgeon: Marty Heck, MD;  Location: [facility];  Service: Cardiovascular;  Laterality: Right;  Posterior tibial  . PORT-A-CATH REMOVAL    . PORTACATH PLACEMENT  insertion and removal -last chemotherapy 10 yrs ago  . TRANSURETHRAL RESECTION OF PROSTATE N/A 08/10/2013   Procedure: TRANSURETHRAL RESECTION OF THE PROSTATE WITH GYRUS INSTRUMENTS;  Surgeon: [name], MD;  Location: [facility];  Service: Urology;  Laterality: N/A;    Family History: Family History  Problem Relation Age of  Onset  . Heart attack Mother        died in her 74s  . Other Father 23       pneumonia - died  . Breast cancer Sister        dx in her 68s-60s  . Colon cancer Brother        dx in his 84s  . Cancer Sister        TAH/BSO due to "male cancer"  . Breast cancer Daughter 9  . Breast cancer Daughter 47       DCIS - ATM VUS on [employer] 83 gene panel in 2018  . Breast cancer Daughter 19       DCIS - Negative on My[employer] MyRisk 25 gene apnel in 2015  . Thyroid cancer Daughter 66       Medullary and Papillary  . Thyroid nodules Grandchild     Social History: Social History   Tobacco Use  Smoking Status Never Smoker  Smokeless Tobacco Never Used   Social History   Substance and Sexual Activity  Alcohol Use No   Social History   Substance and Sexual Activity  Drug Use No    Allergies: Allergies  Allergen Reactions  . Tape Other (See Comments)    SKIN IS VERY THIN AND WILL TEAR AND BRUISE VERY EASILY!!!!  . Augmentin [Amoxicillin-Pot Clavulanate] Rash and Other (See Comments)    Redness of skin    Medications: Current Outpatient Medications  Medication Sig Dispense Refill  . acetaminophen (TYLENOL) 500 MG tablet Take 500 mg by mouth at bedtime.    Marland Kitchen albuterol (2.5 MG/3ML) 0.083% NEBU 3 mL, albuterol (5 MG/ML) 0.5% NEBU 0.5 mL Inhale into the lungs 3 (three) times daily as needed.    Marland Kitchen apixaban (ELIQUIS) 5 MG TABS tablet Take 1 tablet (5 mg total) by mouth 2 (two) times daily. 180 tablet 3  . Aspirin 81 MG CAPS Take 81 mg by mouth daily.     . blood glucose meter kit and supplies Dispense based on patient and insurance preference. Use four times daily as directed. (FOR ICD-10 E10.9, E11.9). 1 each 0  . cephALEXin (KEFLEX) 250 MG capsule Take 250 mg by mouth at bedtime. Preventative for UTI prescribed by urology    . Cholecalciferol (VITAMIN D3) 1000 units CAPS Take 1,000 Units by mouth daily.     Marland Kitchen Dextromethorphan-guaiFENesin (MUCINEX DM PO) Take by mouth daily.    .  diclofenac Sodium (VOLTAREN) 1 % GEL Apply 2 g topically 4 (four) times daily. (Patient taking differently: Apply 2 g topically as needed.) 150 g 0  . diltiazem (CARDIZEM SR) 60 MG 12 hr capsule Take 1 capsule (60 mg total) by mouth 2 (two) times daily. 60 capsule 11  . donepezil (ARICEPT) 10 MG tablet Take 1 tablet by mouth once daily 90 tablet 0  . furosemide (LASIX) 20 MG tablet Take 0.5 tablets (10 mg total) by mouth daily. Take for 5 day, then change to as needed for shortness of breath for pleural effusion. 30 tablet 0  . glipiZIDE (GLUCOTROL XL) 5 MG 24 hr tablet Take 1 tablet (5 mg total) by mouth daily with breakfast.  90 tablet 1  . glucose blood (EASYMAX TEST) test strip Use as instructed 100 each 12  . Lancets 28G MISC 1 kit by Does not apply route every morning. 100 each 1  . lisinopril (ZESTRIL) 20 MG tablet Take 1 tablet by mouth once daily 90 tablet 3  . metFORMIN (GLUCOPHAGE) 850 MG tablet Take 1 tablet (850 mg total) by mouth 2 (two) times daily with a meal. 180 tablet 0  . mirtazapine (REMERON) 15 MG tablet Take 1 tablet (15 mg total) by mouth at bedtime. 90 tablet 1  . omeprazole (PRILOSEC) 40 MG capsule Take 1 capsule (40 mg total) by mouth daily. 30 capsule 3  . Probiotic Product (PROBIOTIC DAILY PO) Take 1 tablet by mouth daily. Every morning     . simethicone (MYLICON) 80 MG chewable tablet Chew 80 mg by mouth daily.    . Cranberry 500 MG TABS Take 500 mg by mouth daily.  (Patient not taking: Reported on 07/22/2020)    . denosumab (XGEVA) 120 MG/1.7ML SOLN injection Inject 120 mg into the skin every 3 (three) months.    . Multiple Vitamin (MULTIVITAMIN WITH MINERALS) TABS tablet Take 1 tablet by mouth daily.  (Patient not taking: Reported on 07/22/2020)     No current facility-administered medications for this visit.    Review of Systems: GENERAL: negative for malaise, night sweats HEENT: No changes in hearing or vision, no nose bleeds or other nasal problems. NECK: Negative  for lumps, goiter, pain and significant neck swelling RESPIRATORY: Negative for cough, wheezing CARDIOVASCULAR: Negative for chest pain, leg swelling, palpitations, orthopnea GI: SEE HPI MUSCULOSKELETAL: Negative for joint pain or swelling, back pain, and muscle pain. SKIN: Negative for lesions, rash PSYCH: Negative for sleep disturbance, mood disorder and recent psychosocial stressors. HEMATOLOGY Negative for prolonged bleeding, bruising easily, and swollen nodes. ENDOCRINE: Negative for cold or heat intolerance, polyuria, polydipsia and goiter. NEURO: negative for tremor, gait imbalance, syncope and seizures. The remainder of the review of systems is noncontributory.   Physical Exam: BP (!) 90/59 (BP Location: Left Arm, Patient Position: Sitting, Cuff Size: Small)   Pulse 81   Temp 98.2 F (36.8 C) (Oral)   Ht _0  (1.778 m)   BMI 19.34 kg/m  GENERAL: The patient is AO x3, in no acute distress. Elder. Sitting in wheelchair. HEENT: Head is normocephalic and atraumatic. EOMI are intact. Mouth is well hydrated and without lesions. NECK: Supple. No masses LUNGS: Clear to auscultation. No presence of rhonchi/wheezing/rales. Adequate chest expansion HEART: RRR, normal s1 and s2. ABDOMEN: Soft, nontender, no guarding, no peritoneal signs, and nondistended. BS +. No masses. EXTREMITIES: Without any cyanosis, clubbing, rash, lesions or edema. NEUROLOGIC: AOx3, no focal motor deficit. SKIN: no jaundice, no rashes  Imaging/Labs: as above  I personally reviewed and interpreted the available labs, imaging and endoscopic files.  Impression and Plan: [name] is a 87 y.o. male hx of A. fib on Eliquis, dementia, diabetes, GIST tumor who presents for follow up after recently being evaluated in the ER for self-limited episode of  food impaction.  Patient has presented recurrent episodes of dysphagia in the past, for which he has undergone repeat EGDs with dilation and treatment of  strictures, usually at the GE junction.  However he has been noted to have findings concerning for dysmotility.  Due to this, I think it adequate to first have a characterization of his anatomy with a time barium swallow, followed by an EGD with possible dilation or  Botox injection based on findings given his comorbidities and age.  For now, he can try crushing the medications that are now to release but also to find soft food that is not very dry so we will avoid having any further episodes of food impaction.  Patient, daughter and caregiver understood and agreed.  - Schedule barium esophagram with pill  All questions were answered.      Harvel Quale, MD Gastroenterology and Hepatology [facility]

## 2020-07-22 NOTE — Patient Instructions (Signed)
Schedule barium esophagram with pill

## 2020-07-23 ENCOUNTER — Encounter (INDEPENDENT_AMBULATORY_CARE_PROVIDER_SITE_OTHER): Payer: Self-pay

## 2020-07-24 ENCOUNTER — Encounter (INDEPENDENT_AMBULATORY_CARE_PROVIDER_SITE_OTHER): Payer: Self-pay | Admitting: Gastroenterology

## 2020-07-24 ENCOUNTER — Telehealth (INDEPENDENT_AMBULATORY_CARE_PROVIDER_SITE_OTHER): Payer: Medicare Other | Admitting: Gastroenterology

## 2020-07-24 ENCOUNTER — Other Ambulatory Visit: Payer: Self-pay

## 2020-07-24 VITALS — Ht 70.0 in | Wt 134.0 lb

## 2020-07-24 DIAGNOSIS — R1319 Other dysphagia: Secondary | ICD-10-CM

## 2020-07-24 NOTE — Progress Notes (Signed)
[name], M.D. Gastroenterology & Hepatology [facility] [address] [place], [place] [address] Primary Care Physician: [name] Chandler, NP [place] [id number]  This is a virtual video visit.  It required patient-provider interaction for the medical decision making as documented below. The patient has consented and agreed to proceed with a Telehealth encounter given the current Coronavirus pandemic.  VIRTUAL VISIT NOTE Patient location: Home Provider location: Office  I will communicate my assessment and recommendations to the referring MD via EMR. Note: Occasional unusual wording and randomly placed punctuation marks may result from the use of speech recognition technology to transcribe this document"  Problems 1.  Dysphagia 2.  History of esophageal strictures  History of Present Illness: [name] ADERMAN is a 87 y.o. male With past medical history ofA. fib on Eliquis, dementia, diabetes, GIST tumor  and esophageal strictures, who presents for evaluation of dysphagia and to discuss details in plan.  The daughter was present during the discussion.  She was the main person talking during the appointment.  She has multiple questions regarding when endoscopic submucosal dissection patient undergoing his esophagogastroduodenospy and if undergoing it with her about this injection or dilation will be performed.  She also would like to make me aware about his surgical risk as he had complicated history after a vascular intervention in June 2021, after which he had to be observed overnight after presenting an episode of syncope.  Finally, she will also like to discuss all the timing to discontinue anticoagulation prior to the esophagogastroduodenospy.  The daughter reports that he is still present with the same symptoms as in the past as he is still having dysphagia but tolerating specific type of food.  Has not  passed any more choking episodes.  Denies nausea, vomiting, fever, chills, hematochezia, melena, hematemesis, abdominal distention, abdominal pain, diarrhea, jaundice, pruritus.  Past Medical History: Past Medical History:  Diagnosis Date  . Acute bronchitis   . Acute lower UTI 11/25/2017  . Amputation of toe of left foot ([place]) 07/30/2017, 08/27/2017    Great toe 07/30/2017 Second toe 08/27/2017   . Arthritis   . Aspiration pneumonia ([place]) 01/31/2018  . Atrial fibrillation ([place])    Dr. Harl Bowie- [place] follows saw 11'14  . Bladder stones    tx. with oral meds and antibiotics, now surgery planned  . Bone metastases ([place]) 08/21/2015  . Cataracts, both eyes    surgery planned May 2015  . Community acquired pneumonia 04/16/2018  . Cystoid macular edema 04/21/2015    Right eye 04/21/2015   . Dehydration 04/16/2018  . Dementia without behavioral disturbance ([place]) 04/16/2018  . Diabetes mellitus without complication ([place])    Type II  . Diarrhea 11/25/2017  . Dyspnea    with activity  . Dysrhythmia    Afib  . Family history of breast cancer   . Family history of colon cancer   . Genetic testing 02/09/2017  . GERD (gastroesophageal reflux disease)   . 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 Margot Chimes, thus far without recurrence.   Marland Kitchen History of MRI of lumbar spine 03/05/2014  . Hypertension   . Hypertrophy of prostate with urinary obstruction   . Hypomagnesemia 03/16/2017  . Metabolic encephalopathy 08/65/7846  .  Metastatic adenocarcinoma to prostate ([place]) 09/08/2013  . 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 NHL (non-Hodgkin's lymphoma) ([place]) 2005   Diffuse large B-cell lymphoma, clinically stage IIIA, CD20 positive, status post  cervical lymph node biopsy 08/03/2003 on the left. PET scan was also positive in the spleen and small bowel region, but bone marrow aspiration and biopsy were negative. So he essentially had stage IIIAs. He received R-CHOP x6 cycles with CR established by PET scan criteria on 11/23/2003 with no evidence for relapse th  . Numbness 02/19/2017   Per [name] new patient packet  . Osteomyelitis ([place]) 06/29/2017   toe of left foot   . PAD (peripheral artery disease) ([place]) 08/27/2017  . Polyneuropathy 02/19/2017  . Postural dizziness with presyncope 02/19/2017  . S/P TURP 08/10/2013  . Sepsis ([place]) 07/02/2018  . Skin cancer    Basal cell- face,head, neck,  arms, legs, Back  . Urinary frequency 02/19/2017    Past Surgical History: Past Surgical History:  Procedure Laterality Date  . ABDOMINAL AORTOGRAM W/LOWER EXTREMITY N/A 09/20/2019   Procedure: ABDOMINAL AORTOGRAM W/LOWER EXTREMITY;  Surgeon: Marty Heck, MD;  Location: [facility];  Service: Cardiovascular;  Laterality: N/A;  . AMPUTATION Left 06/29/2017   Procedure: AMPUTATION LEFT GREAT TOE;  Surgeon: [name], MD;  Location: [facility];  Service: Vascular;  Laterality: Left;  . AMPUTATION Left 08/27/2017   Procedure: AMPUTATION Left SECOND TOE;  Surgeon: [name], MD;  Location: [facility];  Service: Vascular;  Laterality: Left;  . CATARACT EXTRACTION Bilateral 2015  . CHOLECYSTECTOMY    . COLON RESECTION     small bowel  . CYSTOSCOPY WITH LITHOLAPAXY N/A 08/10/2013   Procedure: CYSTOSCOPY WITH LITHOLAPAXY WITH [name];  Surgeon: [name], MD;  Location: [facility];  Service: Urology;  Laterality: N/A;  . ESOPHAGEAL DILATION N/A 02/27/2016   Procedure: ESOPHAGEAL DILATION;  Surgeon: [name], MD;  [facility];  Service: Endoscopy;  Laterality: N/A;  . ESOPHAGEAL DILATION N/A 07/07/2018   Procedure: ESOPHAGEAL DILATION;  Surgeon: [name], MD;  [facility];  Service:  Endoscopy;  Laterality: N/A;  . ESOPHAGOGASTRODUODENOSCOPY N/A 02/27/2016   Procedure: ESOPHAGOGASTRODUODENOSCOPY (EGD);  Surgeon: [name], MD;  [facility];  Service: Endoscopy;  Laterality: N/A;  12:00  . ESOPHAGOGASTRODUODENOSCOPY N/A 07/07/2018   Procedure: ESOPHAGOGASTRODUODENOSCOPY (EGD);  Surgeon: [name], MD;  [facility];  Service: Endoscopy;  Laterality: N/A;  . INCISION AND DRAINAGE PERIRECTAL ABSCESS    . LOWER EXTREMITY ANGIOGRAPHY N/A 06/25/2017   Procedure: LOWER EXTREMITY ANGIOGRAPHY;  Surgeon: [name], MD;  Location: [facility];  Service: Cardiovascular;  Laterality: N/A;  . LYMPH NODE DISSECTION Left    '05-neck  . PERIPHERAL VASCULAR ATHERECTOMY Right 09/20/2019   Procedure: PERIPHERAL VASCULAR ATHERECTOMY;  Surgeon: Marty Heck, MD;  Location: [facility];  Service: Cardiovascular;  Laterality: Right;  Posterior tibial  . PORT-A-CATH REMOVAL    . PORTACATH PLACEMENT     insertion and removal -last chemotherapy 10 yrs ago  . TRANSURETHRAL RESECTION OF PROSTATE N/A 08/10/2013   Procedure: TRANSURETHRAL RESECTION OF THE PROSTATE WITH GYRUS INSTRUMENTS;  Surgeon: [name], MD;  [facility]: [facility];  Service: Urology;  Laterality: N/A;    Family History: Family History  Problem Relation Age of Onset  . Heart attack Mother        died in her 22s  . Other Father  27       pneumonia - died  . Breast cancer Sister        dx in her 41s-60s  . Colon cancer Brother        dx in his 58s  . Cancer Sister        TAH/BSO due to "male cancer"  . Breast cancer Daughter 47  . Breast cancer Daughter 42       DCIS - ATM VUS on [employer] 83 gene panel in 2018  . Breast cancer Daughter 13       DCIS - Negative on [employer] MyRisk 25 gene apnel in 2015  . Thyroid cancer Daughter 16       Medullary and Papillary  . Thyroid nodules Grandchild     Social History: Social History   Tobacco Use  Smoking Status  Never Smoker  Smokeless Tobacco Never Used   Social History   Substance and Sexual Activity  Alcohol Use No   Social History   Substance and Sexual Activity  Drug Use No    Allergies: Allergies  Allergen Reactions  . Tape Other (See Comments)    SKIN IS VERY THIN AND WILL TEAR AND BRUISE VERY EASILY!!!!  . Augmentin [Amoxicillin-Pot Clavulanate] Rash and Other (See Comments)    Redness of skin    Medications: Current Outpatient Medications  Medication Sig Dispense Refill  . acetaminophen (TYLENOL) 500 MG tablet Take 500 mg by mouth at bedtime.    Marland Kitchen albuterol (2.5 MG/3ML) 0.083% NEBU 3 mL, albuterol (5 MG/ML) 0.5% NEBU 0.5 mL Inhale into the lungs 3 (three) times daily as needed.    Marland Kitchen apixaban (ELIQUIS) 5 MG TABS tablet Take 1 tablet (5 mg total) by mouth 2 (two) times daily. 180 tablet 3  . Aspirin 81 MG CAPS Take 81 mg by mouth daily.     . blood glucose meter kit and supplies Dispense based on patient and insurance preference. Use four times daily as directed. (FOR ICD-10 E10.9, E11.9). 1 each 0  . cephALEXin (KEFLEX) 250 MG capsule Take 250 mg by mouth at bedtime. Preventative for UTI prescribed by urology    . Cholecalciferol (VITAMIN D3) 1000 units CAPS Take 1,000 Units by mouth daily.     . Cranberry 500 MG TABS Take 500 mg by mouth daily.    Marland Kitchen denosumab (XGEVA) 120 MG/1.7ML SOLN injection Inject 120 mg into the skin every 3 (three) months.    . Dextromethorphan-guaiFENesin (MUCINEX DM PO) Take by mouth daily.    . diclofenac Sodium (VOLTAREN) 1 % GEL Apply 2 g topically 4 (four) times daily. (Patient taking differently: Apply 2 g topically as needed.) 150 g 0  . diltiazem (CARDIZEM SR) 60 MG 12 hr capsule Take 1 capsule (60 mg total) by mouth 2 (two) times daily. 60 capsule 11  . donepezil (ARICEPT) 10 MG tablet Take 1 tablet by mouth once daily 90 tablet 0  . furosemide (LASIX) 20 MG tablet Take 0.5 tablets (10 mg total) by mouth daily. Take for 5 day, then change to as  needed for shortness of breath for pleural effusion. 30 tablet 0  . glipiZIDE (GLUCOTROL XL) 5 MG 24 hr tablet Take 1 tablet (5 mg total) by mouth daily with breakfast. 90 tablet 1  . glucose blood (EASYMAX TEST) test strip Use as instructed 100 each 12  . Lancets 28G MISC 1 kit by Does not apply route every morning. 100 each 1  . lisinopril (ZESTRIL) 20 MG tablet  Take 1 tablet by mouth once daily 90 tablet 3  . metFORMIN (GLUCOPHAGE) 850 MG tablet Take 1 tablet (850 mg total) by mouth 2 (two) times daily with a meal. 180 tablet 0  . Multiple Vitamin (MULTIVITAMIN WITH MINERALS) TABS tablet Take 1 tablet by mouth daily.    Marland Kitchen omeprazole (PRILOSEC) 40 MG capsule Take 1 capsule (40 mg total) by mouth daily. 30 capsule 3  . Probiotic Product (PROBIOTIC DAILY PO) Take 1 tablet by mouth daily. Every morning     . simethicone (MYLICON) 80 MG chewable tablet Chew 80 mg by mouth daily.    . mirtazapine (REMERON) 15 MG tablet Take 1 tablet (15 mg total) by mouth at bedtime. 90 tablet 1   No current facility-administered medications for this visit.    Review of Systems: GENERAL: negative for malaise, night sweats HEENT: No changes in hearing or vision, no nose bleeds or other nasal problems. NECK: Negative for lumps, goiter, pain and significant neck swelling RESPIRATORY: Negative for cough, wheezing CARDIOVASCULAR: Negative for chest pain, leg swelling, palpitations, orthopnea GI: SEE HPI MUSCULOSKELETAL: Negative for joint pain or swelling, back pain, and muscle pain. SKIN: Negative for lesions, rash PSYCH: Negative for sleep disturbance, mood disorder and recent psychosocial stressors. HEMATOLOGY Negative for prolonged bleeding, bruising easily, and swollen nodes. ENDOCRINE: Negative for cold or heat intolerance, polyuria, polydipsia and goiter. NEURO: negative for tremor, gait imbalance, syncope and seizures. The remainder of the review of systems is noncontributory.   Physical Exam: Not  performed as the encounter was held with the daughter  Imaging/Labs: as above  I personally reviewed and interpreted the available labs, imaging and endoscopic files.  Impression and Plan: [name] is a 87 y.o. male With past medical history ofA. fib on Eliquis, dementia, diabetes, GIST tumor  and esophageal strictures, who presents for evaluation of dysphagia and to discuss details in plan.  I explained to the daughter that the next step in the patient's care will be to perform the esophagram with pill that will help assess if he has any structural abnormalities or alterations concerning for achalasia given the degree of dilation he presented in his previous CT of the chest and neck.  She understood this.  I explained to her that based on these findings we will determine if he will need to undergo evaluation for Botox injection at the time of a subsequent EGD which will not be scheduled yet until we have these results back.  Explained to her that they will help [place] characterize the etiology of his recurrent dysphagia.  If proceeding with this endoscopy, we will need to reach his cardiologist Dr. Harl Bowie to obtain clearance to obtain hold his Eliquis 2 days prior.  Finally, I explained to her that even though the patient has multiple comorbidities and has advanced age, he is still a candidate to undergo endoscopic intervention at this may provide relief of his symptoms.  I advised her that an option would be to not perform any endoscopic procedures but as he is having significant symptoms, both are  interested in pursuing this.  I explained to her about the potential risk and complications such as bleeding and bowel perforation, she understood this.  - Proceed with barium esophagram with pill  All questions were answered.      Face-to-face time: I spent a total of  25 minutes  [name] Peppers, MD Gastroenterology and Hepatology [facility]

## 2020-07-25 ENCOUNTER — Encounter (HOSPITAL_BASED_OUTPATIENT_CLINIC_OR_DEPARTMENT_OTHER): Payer: Medicare Other | Admitting: Internal Medicine

## 2020-07-29 ENCOUNTER — Ambulatory Visit (HOSPITAL_COMMUNITY)
Admission: RE | Admit: 2020-07-29 | Discharge: 2020-07-29 | Disposition: A | Discharge status: Home / Self Care | Payer: Medicare Other | Source: Ambulatory Visit | Attending: Gastroenterology | Admitting: Gastroenterology

## 2020-07-29 ENCOUNTER — Other Ambulatory Visit: Payer: Self-pay | Admitting: Nurse Practitioner

## 2020-07-29 DIAGNOSIS — R1319 Other dysphagia: Secondary | ICD-10-CM | POA: Diagnosis not present

## 2020-07-29 DIAGNOSIS — T17320A Food in larynx causing asphyxiation, initial encounter: Secondary | ICD-10-CM | POA: Diagnosis not present

## 2020-07-29 DIAGNOSIS — R1310 Dysphagia, unspecified: Secondary | ICD-10-CM | POA: Diagnosis not present

## 2020-07-29 DIAGNOSIS — E1169 Type 2 diabetes mellitus with other specified complication: Secondary | ICD-10-CM

## 2020-07-29 MED ORDER — GLIPIZIDE ER 5 MG PO TB24: 5.0000 mg | ORAL_TABLET | Freq: Every day | ORAL | 0 refills | Status: DC

## 2020-07-30 ENCOUNTER — Other Ambulatory Visit: Payer: Self-pay

## 2020-07-30 ENCOUNTER — Inpatient Hospital Stay (HOSPITAL_COMMUNITY): Payer: Medicare Other | Attending: Hematology

## 2020-07-30 DIAGNOSIS — C7951 Secondary malignant neoplasm of bone: Secondary | ICD-10-CM | POA: Diagnosis not present

## 2020-07-30 DIAGNOSIS — E1169 Type 2 diabetes mellitus with other specified complication: Secondary | ICD-10-CM

## 2020-07-30 DIAGNOSIS — E119 Type 2 diabetes mellitus without complications: Secondary | ICD-10-CM | POA: Insufficient documentation

## 2020-07-30 DIAGNOSIS — C61 Malignant neoplasm of prostate: Secondary | ICD-10-CM | POA: Insufficient documentation

## 2020-07-30 DIAGNOSIS — L97519 Non-pressure chronic ulcer of other part of right foot with unspecified severity: Secondary | ICD-10-CM | POA: Diagnosis not present

## 2020-07-30 DIAGNOSIS — Z515 Encounter for palliative care: Secondary | ICD-10-CM | POA: Diagnosis not present

## 2020-07-30 LAB — COMPREHENSIVE METABOLIC PANEL
ALT: 9 U/L (ref 0–44)
AST: 13 U/L — ABNORMAL LOW (ref 15–41)
Albumin: 3 g/dL — ABNORMAL LOW (ref 3.5–5.0)
Alkaline Phosphatase: 77 U/L (ref 38–126)
Anion gap: 12 (ref 5–15)
BUN: 33 mg/dL — ABNORMAL HIGH (ref 8–23)
CO2: 17 mmol/L — ABNORMAL LOW (ref 22–32)
Calcium: 8.7 mg/dL — ABNORMAL LOW (ref 8.9–10.3)
Chloride: 111 mmol/L (ref 98–111)
Creatinine, Ser: 1.2 mg/dL (ref 0.61–1.24)
GFR, Estimated: 56 mL/min — ABNORMAL LOW (ref >60)
Glucose, Bld: 108 mg/dL — ABNORMAL HIGH (ref 70–99)
Potassium: 4.6 mmol/L (ref 3.5–5.1)
Sodium: 140 mmol/L (ref 135–145)
Total Bilirubin: 0.3 mg/dL (ref 0.3–1.2)
Total Protein: 7 g/dL (ref 6.5–8.1)

## 2020-07-31 ENCOUNTER — Other Ambulatory Visit (INDEPENDENT_AMBULATORY_CARE_PROVIDER_SITE_OTHER): Payer: Self-pay | Admitting: Gastroenterology

## 2020-07-31 ENCOUNTER — Other Ambulatory Visit (INDEPENDENT_AMBULATORY_CARE_PROVIDER_SITE_OTHER): Payer: Self-pay

## 2020-07-31 ENCOUNTER — Encounter (INDEPENDENT_AMBULATORY_CARE_PROVIDER_SITE_OTHER): Payer: Self-pay

## 2020-07-31 ENCOUNTER — Encounter (INDEPENDENT_AMBULATORY_CARE_PROVIDER_SITE_OTHER): Payer: Self-pay | Admitting: *Deleted

## 2020-07-31 DIAGNOSIS — Z7901 Long term (current) use of anticoagulants: Secondary | ICD-10-CM

## 2020-07-31 DIAGNOSIS — K220 Achalasia of cardia: Secondary | ICD-10-CM

## 2020-07-31 DIAGNOSIS — R1319 Other dysphagia: Secondary | ICD-10-CM

## 2020-07-31 DIAGNOSIS — R1312 Dysphagia, oropharyngeal phase: Secondary | ICD-10-CM

## 2020-07-31 MED ORDER — ONABOTULINUMTOXINA 100 UNITS IJ SOLR: 100.0000 [IU] | Freq: Once | INTRAMUSCULAR | Status: DC

## 2020-08-01 ENCOUNTER — Ambulatory Visit
Admission: RE | Admit: 2020-08-01 | Discharge: 2020-08-01 | Disposition: A | Discharge status: Home / Self Care | Payer: Medicare Other | Source: Ambulatory Visit | Attending: Adult Health | Admitting: Adult Health

## 2020-08-01 ENCOUNTER — Ambulatory Visit: Payer: Self-pay

## 2020-08-01 ENCOUNTER — Other Ambulatory Visit: Payer: Self-pay

## 2020-08-01 ENCOUNTER — Encounter: Payer: Self-pay | Admitting: Adult Health

## 2020-08-01 ENCOUNTER — Ambulatory Visit (INDEPENDENT_AMBULATORY_CARE_PROVIDER_SITE_OTHER): Payer: Medicare Other | Admitting: Adult Health

## 2020-08-01 VITALS — BP 120/62 | HR 92 | Temp 97.7°F | Resp 20 | Ht 70.0 in

## 2020-08-01 DIAGNOSIS — L03115 Cellulitis of right lower limb: Secondary | ICD-10-CM | POA: Diagnosis not present

## 2020-08-01 DIAGNOSIS — R600 Localized edema: Secondary | ICD-10-CM | POA: Diagnosis not present

## 2020-08-01 DIAGNOSIS — Z89412 Acquired absence of left great toe: Secondary | ICD-10-CM | POA: Diagnosis not present

## 2020-08-01 MED ORDER — DOXYCYCLINE HYCLATE 100 MG PO TABS: 100.0000 mg | ORAL_TABLET | Freq: Two times a day (BID) | ORAL | 0 refills | Status: DC

## 2020-08-01 NOTE — Progress Notes (Signed)
Location:     [place] of Service:     [facility]  Provider:  Roneka Gilpin Medina-Vargas  DNP  [name], NP  Patient Care Team: [name], NP as PCP - General (Geriatric Medicine) [name], MD as PCP - Cardiology (Cardiology) [name], MD as Consulting Physician (Urology) [name], MD (Inactive) as Consulting Physician (Hematology and Oncology) [name], MD as Consulting Physician (Orthopedic Surgery) [name], DPM as Consulting Physician (Podiatry) [name], MD as Consulting Physician (Hematology) [name], MD (Dermatology) [name], MD as Consulting Physician (Gastroenterology) [name], MD as Consulting Physician (Vascular Surgery) Sater, [name], MD (Neurology) Clinic, [name], [facility] (Ophthalmology)  Extended Emergency Contact Information Primary Emergency Contact: Courtney Heys, [place] Phone: [phone] Mobile Phone: [phone] Relation: Daughter Secondary Emergency Contact: Marsh,Valerie Address: [address]          [place], [place] [address] [place] Phone: [phone] Relation: Daughter  Code Status:  DNR  Goals of care: Advanced Directive information Advanced Directives 08/01/2020  Does Patient Have a Medical Advance Directive? Yes  Type of Advance Directive Living will;Out of facility DNR (pink MOST or yellow form)  Does patient want to make changes to medical advance directive? No - Patient declined  Copy of [place] in Chart? -  Would patient like information on creating a medical advance directive? -  Pre-existing out of facility DNR order (yellow form or pink MOST form) Yellow form placed in chart (order not valid for inpatient use)     Chief Complaint  Patient presents with  . Acute Visit    Possible foot infection on under  right great toe    HPI:  Pt is a 87 y.o. male seen today for an acute visit for right foot swelling. He came in the office with daughter and a caregiver. He has a chronic right great toe wound which is being followed up by wound clinic. Daughter  stated that on 07/09/20 he was started on Clindamycin and has now completed it. Today, daughter noted right foot has increased redness and swelling. Patient denied right foot pain. Right great toe is black/necrotic. Vein and vascular was consulted and daughter was told that he can only have AKA instead of just the toe amputation. Patient and family does not want amputation at this time. There was no reported fever. Daughter stated that patient has an appointment at the wound clinic on 08/13/20.   Past Medical History:  Diagnosis Date  . Acute bronchitis   . Acute lower UTI 11/25/2017  . Amputation of toe of left foot ([place]) 07/30/2017, 08/27/2017    Great toe 07/30/2017 Second toe 08/27/2017   . Arthritis   . Aspiration pneumonia ([place]) 01/31/2018  . Atrial fibrillation ([place])    Dr. Harl Bowie- [facility] follows saw 11'14  . Bladder stones    tx. with oral meds and antibiotics, now surgery planned  . Bone metastases ([place]) 08/21/2015  . Cataracts, both eyes    surgery planned May 2015  . Community acquired pneumonia 04/16/2018  . Cystoid macular edema 04/21/2015    Right eye 04/21/2015   . Dehydration 04/16/2018  . Dementia without behavioral disturbance ([place]) 04/16/2018  . Diabetes mellitus without complication ([place])    Type II  . Diarrhea 11/25/2017  . Dyspnea    with activity  .  Dysrhythmia    Afib  . Family history of breast cancer   . Family history of colon cancer   . Genetic testing 02/09/2017  . GERD (gastroesophageal reflux disease)   . 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 Margot Chimes, thus far without recurrence.   Marland Kitchen History of MRI of lumbar spine 03/05/2014  . Hypertension   . Hypertrophy of prostate with urinary obstruction   . Hypomagnesemia 03/16/2017  . Metabolic encephalopathy 34/37/3578  . Metastatic adenocarcinoma to prostate ([place]) 09/08/2013  . 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 NHL (non-Hodgkin's lymphoma) ([place]) 2005   Diffuse large B-cell lymphoma, clinically stage IIIA, CD20 positive, status post cervical lymph node biopsy 08/03/2003 on the left. PET scan was also positive in the spleen and small bowel region, but bone marrow aspiration and biopsy were negative. So he essentially had stage IIIAs. He received R-CHOP x6 cycles with CR established by PET scan criteria on 11/23/2003 with no evidence for relapse th  . Numbness 02/19/2017   Per [facility] new patient packet  . Osteomyelitis ([place]) 06/29/2017   toe of left foot   . PAD (peripheral artery disease) ([place]) 08/27/2017  . Polyneuropathy 02/19/2017  . Postural dizziness with presyncope 02/19/2017  . S/P TURP 08/10/2013  . Sepsis ([place]) 07/02/2018  . Skin cancer    Basal cell- face,head, neck,  arms, legs, Back  . Urinary frequency 02/19/2017   Past Surgical History:  Procedure Laterality Date  . ABDOMINAL AORTOGRAM W/LOWER EXTREMITY N/A 09/20/2019   Procedure: ABDOMINAL AORTOGRAM W/LOWER EXTREMITY;  Surgeon: Marty Heck, MD;  Location: [place];  Service: Cardiovascular;  Laterality: N/A;  . AMPUTATION Left 06/29/2017   Procedure: AMPUTATION LEFT GREAT TOE;  Surgeon: [name], MD;  Location: [place];  Service: Vascular;  Laterality: Left;  . AMPUTATION Left 08/27/2017   Procedure: AMPUTATION Left SECOND TOE;  Surgeon: [name], MD;  Location: [facility];  Service: Vascular;  Laterality: Left;  . CATARACT EXTRACTION Bilateral 2015  . CHOLECYSTECTOMY    . COLON RESECTION     small  bowel  . CYSTOSCOPY WITH LITHOLAPAXY N/A 08/10/2013   Procedure: CYSTOSCOPY WITH LITHOLAPAXY WITH [name];  Surgeon: [name], MD;  Location[facility];  Service: Urology;  Laterality: N/A;  . ESOPHAGEAL DILATION N/A 02/27/2016   Procedure: ESOPHAGEAL DILATION;  Surgeon: [name], MD;  [facility];  Service: Endoscopy;  Laterality: N/A;  . ESOPHAGEAL DILATION N/A 07/07/2018   Procedure: ESOPHAGEAL DILATION;  Surgeon: [name], MD;  [facility];  Service: Endoscopy;  Laterality: N/A;  . ESOPHAGOGASTRODUODENOSCOPY N/A 02/27/2016   Procedure: ESOPHAGOGASTRODUODENOSCOPY (EGD);  Surgeon: [name], MD;  [facility];  Service: Endoscopy;  Laterality: N/A;  12:00  . ESOPHAGOGASTRODUODENOSCOPY N/A 07/07/2018   Procedure: ESOPHAGOGASTRODUODENOSCOPY (EGD);  Surgeon: [name], MD;  [facility];  Service: Endoscopy;  Laterality: N/A;  . INCISION AND DRAINAGE PERIRECTAL ABSCESS    . LOWER EXTREMITY ANGIOGRAPHY N/A 06/25/2017   Procedure: LOWER EXTREMITY ANGIOGRAPHY;  Surgeon: [name], MD;  Location: [place] [facility];  Service: Cardiovascular;  Laterality: N/A;  . LYMPH NODE DISSECTION Left    '05-neck  . PERIPHERAL VASCULAR ATHERECTOMY Right 09/20/2019  Procedure: PERIPHERAL VASCULAR ATHERECTOMY;  Surgeon: Marty Heck, MD;  Location: [facility];  Service: Cardiovascular;  Laterality: Right;  Posterior tibial  . PORT-A-CATH REMOVAL    . PORTACATH PLACEMENT     insertion and removal -last chemotherapy 10 yrs ago  . TRANSURETHRAL RESECTION OF PROSTATE N/A 08/10/2013   Procedure: TRANSURETHRAL RESECTION OF THE PROSTATE WITH GYRUS INSTRUMENTS;  Surgeon: [name], MD;  [facility];  Service: Urology;  Laterality: N/A;    Allergies  Allergen Reactions  . Tape Other (See Comments)    SKIN IS VERY THIN AND WILL TEAR AND BRUISE VERY EASILY!!!!  . Augmentin [Amoxicillin-Pot Clavulanate] Rash  and Other (See Comments)    Redness of skin    Outpatient Encounter Medications as of 08/01/2020  Medication Sig  . acetaminophen (TYLENOL) 500 MG tablet Take 500 mg by mouth at bedtime.  Marland Kitchen albuterol (2.5 MG/3ML) 0.083% NEBU 3 mL, albuterol (5 MG/ML) 0.5% NEBU 0.5 mL Inhale into the lungs 3 (three) times daily as needed.  Marland Kitchen apixaban (ELIQUIS) 5 MG TABS tablet Take 1 tablet (5 mg total) by mouth 2 (two) times daily.  . Aspirin 81 MG CAPS Take 81 mg by mouth daily.   . blood glucose meter kit and supplies Dispense based on patient and insurance preference. Use four times daily as directed. (FOR ICD-10 E10.9, E11.9).  . cephALEXin (KEFLEX) 250 MG capsule Take 250 mg by mouth at bedtime. Preventative for UTI prescribed by urology  . Cholecalciferol (VITAMIN D3) 1000 units CAPS Take 1,000 Units by mouth daily.   Marland Kitchen denosumab (XGEVA) 120 MG/1.7ML SOLN injection Inject 120 mg into the skin every 3 (three) months.  . Dextromethorphan-guaiFENesin (MUCINEX DM PO) Take by mouth daily.  . diclofenac Sodium (VOLTAREN) 1 % GEL Apply 2 g topically as needed.  . diltiazem (CARDIZEM SR) 60 MG 12 hr capsule Take 1 capsule (60 mg total) by mouth 2 (two) times daily.  Marland Kitchen donepezil (ARICEPT) 10 MG tablet Take 1 tablet by mouth once daily  . doxycycline (VIBRA-TABS) 100 MG tablet Take 1 tablet (100 mg total) by mouth 2 (two) times daily for 14 days.  . furosemide (LASIX) 20 MG tablet Take 0.5 tablets (10 mg total) by mouth daily. Take for 5 day, then change to as needed for shortness of breath for pleural effusion.  Marland Kitchen glipiZIDE (GLUCOTROL XL) 5 MG 24 hr tablet Take 1 tablet (5 mg total) by mouth daily with breakfast.  . glucose blood (EASYMAX TEST) test strip Use as instructed  . Lancets 28G MISC 1 kit by Does not apply route every morning.  Marland Kitchen lisinopril (ZESTRIL) 20 MG tablet Take 1 tablet by mouth once daily  . metFORMIN (GLUCOPHAGE) 850 MG tablet Take 1 tablet (850 mg total) by mouth 2 (two) times daily with a  meal.  . mirtazapine (REMERON) 15 MG tablet Take 15 mg by mouth at bedtime.  Marland Kitchen omeprazole (PRILOSEC) 40 MG capsule Take 1 capsule (40 mg total) by mouth daily.  . Probiotic Product (PROBIOTIC DAILY PO) Take 1 tablet by mouth daily. Every morning   . simethicone (MYLICON) 80 MG chewable tablet Chew 80 mg by mouth daily.  . Cranberry 500 MG TABS Take 500 mg by mouth daily. (Patient not taking: Reported on 08/01/2020)  . Multiple Vitamin (MULTIVITAMIN WITH MINERALS) TABS tablet Take 1 tablet by mouth daily. (Patient not taking: Reported on 08/01/2020)  . [DISCONTINUED] diclofenac Sodium (VOLTAREN) 1 % GEL Apply 2 g topically 4 (four) times  daily. (Patient taking differently: Apply 2 g topically as needed.)  . [DISCONTINUED] mirtazapine (REMERON) 15 MG tablet Take 1 tablet (15 mg total) by mouth at bedtime.   Facility-Administered Encounter Medications as of 08/01/2020  Medication  . botulinum toxin Type A (BOTOX) injection 100 Units    Review of Systems  Constitutional: Positive for appetite change.       Has swallowing issues  HENT: Negative for congestion.   Eyes: Negative.   Respiratory: Negative for cough and shortness of breath.   Cardiovascular: Positive for leg swelling.  Gastrointestinal: Negative for abdominal pain and vomiting.  Genitourinary: Negative for difficulty urinating.  Skin: Positive for wound.  Psychiatric/Behavioral: Negative.     Immunization History  Administered Date(s) Administered  . Influenza Whole 01/23/2015  . Influenza, High Dose Seasonal PF 02/02/2018  . Influenza-Unspecified 02/01/2006, 01/19/2008, 12/19/2009, 01/19/2011, 01/22/2014  . PFIZER(Purple Top)SARS-COV-2 Vaccination 05/07/2019, 06/07/2019  . Pneumococcal Polysaccharide-23 01/19/2004  . Pneumococcal-Unspecified 09/19/1998, 01/22/2014  . Tdap 09/24/2016   Pertinent  Health Maintenance Due  Topic Date Due  . FOOT EXAM  Never done  . OPHTHALMOLOGY EXAM  Never done  . PNA vac Low Risk Adult (2  of 2 - PCV13) 01/23/2015  . HEMOGLOBIN A1C  03/08/2020   Fall Risk  08/01/2020 07/05/2020 04/26/2020 04/16/2020 04/15/2020  Falls in the past year? 0 0 0 1 1  Number falls in past yr: 0 0 0 1 1  Injury with Fall? 0 0 0 0 0  Risk Factor Category  - - - - -  Risk for fall due to : - - - - -  Follow up - - - - -   Functional Status Survey:    Vitals:   08/01/20 1323  BP: 120/62  Pulse: 92  Resp: 20  Temp: 97.7 F (36.5 C)  TempSrc: Temporal  Height: _0  (1.778 m)   Body mass index is 19.23 kg/m. Physical Exam HENT:     Head: Normocephalic.  Eyes:     Conjunctiva/sclera: Conjunctivae normal.  Cardiovascular:     Rate and Rhythm: Normal rate and regular rhythm.  Abdominal:     Palpations: Abdomen is soft.  Musculoskeletal:        General: Normal range of motion.     Right lower leg: Edema present.     Comments: Right foot with 2+edema  Skin:    General: Skin is warm and dry.     Findings: Erythema present.     Comments: Right big toe is black/necrotic  Neurological:     General: No focal deficit present.     Mental Status: He is alert.  Psychiatric:        Mood and Affect: Mood normal.        Behavior: Behavior normal.     Labs reviewed: Recent Labs    09/22/19 0327 09/23/19 0313 11/01/19 1239 05/03/20 1018 07/03/20 1810 07/30/20 1436  NA 140  --    < > 138 137 140  K 4.9  --    < > 4.5 4.5 4.6  CL 111  --    < > 102 105 111  CO2 21*  --    < > 25 24 17*  GLUCOSE 182*  --    < > 124* 151* 108*  BUN 17  --    < > 30* 16 33*  CREATININE 1.21  --    < > 1.12 0.92 1.20  CALCIUM 9.1  --    < >  10.4* 9.7 8.7*  MG 1.0* 1.9  --   --   --   --    < > = values in this interval not displayed.   Recent Labs    03/25/20 1533 05/03/20 1018 07/30/20 1436  AST 12* 17 13*  ALT _0 ALKPHOS 75 59 77  BILITOT 0.3 0.7 0.3  PROT 6.2* 6.7 7.0  ALBUMIN 2.9* 3.3* 3.0*   Recent Labs    02/22/20 0956 05/03/20 1018 07/03/20 1810  WBC 8.3 7.2 9.7  NEUTROABS  3.7 3.8 5.5  HGB 9.5* 10.0* 11.0*  HCT 29.2* 31.9* 32.0*  MCV 93.6 93.0 87.0  PLT 268 215 280   Lab Results  Component Value Date   TSH 3.749 04/17/2018   Lab Results  Component Value Date   HGBA1C 5.1 09/06/2019   Lab Results  Component Value Date   CHOL 147 01/31/2018   HDL 50 01/31/2018   LDLCALC 75 01/31/2018   TRIG 109 01/31/2018   CHOLHDL 2.9 01/31/2018    Significant Diagnostic Results in last 30 days:  CT Soft Tissue Neck W Contrast  Result Date: 07/03/2020 CLINICAL DATA:  Initial evaluation for possible foreign body. EXAM: CT NECK WITH CONTRAST TECHNIQUE: Multidetector CT imaging of the neck was performed using the standard protocol following the bolus administration of intravenous contrast. CONTRAST:  41m OMNIPAQUE IOHEXOL 300 MG/ML  SOLN COMPARISON:  Prior radiograph from earlier the same day. FINDINGS: Pharynx and larynx: Oral cavity within normal limits without discrete mass or collection. No acute abnormality about the remaining dentition. Oropharynx and nasopharynx within normal limits. No retropharyngeal collection or swelling. Epiglottis normal. Vallecula clear. Remainder of the hypopharynx and supraglottic larynx within normal limits. Glottis normal. Subglottic airway patent and clear. Visualized upper esophagus is patulous and distended with internal air-fluid level. No visible radiopaque foreign body. Salivary glands: Subtle 11 mm ovoid lesion seen within the right parotid gland (series 6, image 25). Parotid glands and submandibular glands are otherwise within normal limits. Thyroid: Unremarkable. Lymph nodes: No enlarged or pathologic adenopathy within the neck. Vascular: Normal intravascular enhancement seen throughout the neck. Moderate atheromatous change about the carotid bifurcations and carotid siphons. Limited intracranial: Unremarkable. Visualized orbits: Patient status post bilateral ocular lens replacement. Ocular senescent calcifications noted. Visualized  globes and orbital soft tissues demonstrate no other acute finding. Mastoids and visualized paranasal sinuses: Visualized paranasal sinuses are clear. Right mastoid and middle ear effusion partially visualized. Left mastoid air cells and middle ear cavity are clear. Skeleton: No acute osseous finding. No discrete or worrisome osseous lesions. Moderate cervical spondylosis noted at C5-6. Upper chest: Small layering left pleural effusion partially visualized. Visualized upper chest demonstrates no other acute finding. Partially visualized lungs are otherwise clear. Other: None. IMPRESSION: 1. No visible radiopaque foreign body identified within the neck. 2. Patulous and distended upper esophagus with internal air-fluid level. Appearance favors changes of esophageal dysmotility, although a possible distally obstructive process could also be considered, better assessed on concomitant CT of the chest. 3. 11 mm right parotid lesion, indeterminate. Outpatient ENT referral for further workup and consultation could be performed as clinically warranted. 4. Right mastoid and middle ear effusion, partially visualized. Correlation with physical exam recommended. 5. Small layering left pleural effusion, partially visualized. Electronically Signed   By: BJeannine BogaM.D.   On: 07/03/2020 21:29   CT Chest W Contrast  Result Date: 07/03/2020 CLINICAL DATA:  Cough. Possible pneumomediastinum on x-ray. Possible fluid in function. Foreign body sensation  in throat. EXAM: CT CHEST WITH CONTRAST TECHNIQUE: Multidetector CT imaging of the chest was performed during intravenous contrast administration. CONTRAST:  71m OMNIPAQUE IOHEXOL 300 MG/ML  SOLN COMPARISON:  Chest radiograph earlier today.  Chest CT 04/15/2017 FINDINGS: Cardiovascular: Aortic atherosclerosis and tortuosity. No dissection or acute aortic findings. Mild cardiomegaly. Occasional coronary artery calcifications. No filling defects in the central pulmonary  arteries to the lobar level, exam not tailored to pulmonary artery evaluation. Mediastinum/Nodes: No pneumomediastinum. Radiographic appearance was due to gaseous distension of the upper esophagus. The esophagus is dilated, air-filled proximally, contains dependent intraluminal contents in the mid and distal aspect. Distal esophagus is tortuous and courses left laterally. Question of irregular esophageal wall thickening. No mediastinal or hilar adenopathy. No suspicious thyroid nodule. Lungs/Pleura: Small left pleural effusion. Slight ground-glass and reticulonodular airspace disease in the dependent right greater than left lower lobe. No debris within the trachea or central bronchi. Mild lower lobe bronchial thickening. No pulmonary mass. No pneumothorax. Upper Abdomen: Pancreatic calcifications and ductal dilatation typical of chronic pancreatitis. No peripancreatic fat stranding. Cholecystectomy. No acute findings in the upper abdomen. Musculoskeletal: Chronic T12 superior endplate irregularity with prominent Schmorl's nodes/mild compression fracture. Chronic regularity of upper sternal body, unchanged this is unchanged from prior exam. No acute osseous abnormalities are seen. IMPRESSION: 1. The esophagus is dilated, air-filled proximally, contains dependent intraluminal contents in the mid and distal aspect. Mild irregular distal esophageal wall thickening. Suspect chronic esophageal dysmotility, with patulous esophagus seen on prior exam to a lesser extent. 2. Small left pleural effusion. Lower lobe bronchial thickening with mild ground-glass and reticulonodular airspace disease in the dependent right greater than left lower lobe is suspicious for aspiration. 3. Mild cardiomegaly.  Coronary artery calcifications. 4. Chronic pancreatitis. Aortic Atherosclerosis (ICD10-I70.0). Electronically Signed   By: MKeith RakeM.D.   On: 07/03/2020 20:58   DG Chest Port 1 View  Result Date: 07/03/2020 CLINICAL  DATA:  Aspiration something stuck in throat EXAM: PORTABLE CHEST 1 VIEW COMPARISON:  04/16/2020 FINDINGS: No focal consolidation. Trace right effusion. Mild cardiomegaly. Aortic atherosclerosis. No pneumothorax. Possible air distension of the esophagus. IMPRESSION: 1. Trace right pleural effusion or thickening. No focal airspace disease. 2. Possible air distension of the esophagus. CT chest could be obtained if continued concern for esophageal foreign body. Electronically Signed   By: KDonavan FoilM.D.   On: 07/03/2020 20:01   DG ESOPHAGUS W SINGLE CM (SOL OR THIN BA)  Result Date: 07/29/2020 CLINICAL DATA:  Dysphagia, choking on food, feels like food sticking, history of aspiration pneumonia, GERD, dilatation in 2020 EXAM: ESOPHOGRAM / BARIUM SWALLOW / BARIUM TABLET STUDY TECHNIQUE: Combined double contrast and single contrast examination performed using effervescent crystals, thick barium liquid, and thin barium liquid. The patient was observed with fluoroscopy swallowing a 13 mm barium sulphate tablet. FLUOROSCOPY TIME:  Fluoroscopy Time:  2 minutes 24 seconds Radiation Exposure Index (if provided by the fluoroscopic device): 37.7 mGy Number of Acquired Spot Images: multiple fluoroscopic screen captures COMPARISON:  None FINDINGS: Patient unable to stand upright for imaging. Examination performed with patient at horizontal an with head of table elevated 45 degrees. Diffuse esophageal dysmotility. Laryngeal penetration without aspiration. Vallecular, piriform sinus and or residuals noted. Subjective narrowing of the distal esophagus just above GE junction. Large distal esophageal diverticulum identified , retaining a significant amount of barium, which then freely reflux is into the mid esophagus as far cranially as the aortic arch despite elevation of head of table 45 degrees. A  second small developing distal esophageal diverticulum is seen above the epiphrenic diverticulum. 12.5 mm diameter barium tablet  became lodged in the large diverticulum; unable to assess patency/diameter of the distal most thoracic esophagus. IMPRESSION: Subjective narrowing of the distal esophagus. 12.5 mm diameter barium tablet became lodged in a large distal esophageal diverticulum; this diverticulum retains a large amount of contrast with subsequent reflux of this contrast to the level of the aortic arch. Second developing small distal esophageal diverticulum. Laryngeal penetration without aspiration although vallecular, piriform sinus and oral residuals are noted; question whether these place patient at risk for aspiration. Consider speech therapy evaluation. Electronically Signed   By: Lavonia Dana M.D.   On: 07/29/2020 10:09    Assessment/Plan  1. Cellulitis of right foot -   Continue right toe and heel treatment - doxycycline (VIBRA-TABS) 100 MG tablet; Take 1 tablet (100 mg total) by mouth 2 (two) times daily for 14 days.  Dispense: 20 tablet; Refill: 0 - DG Foot 2 Views Right to rule out osteomyelitis    Family/ staff Communication:  Discussed plan of care with patient, caregiver and daughter.  Labs/tests ordered:  X-ray of right foot to rule out osteomyelitis

## 2020-08-01 NOTE — Patient Instructions (Signed)

## 2020-08-02 NOTE — Progress Notes (Signed)
Right foot x-ray is negative for osteomyelitis. Continue ATB and treatment daily.

## 2020-08-04 DIAGNOSIS — R509 Fever, unspecified: Secondary | ICD-10-CM | POA: Diagnosis not present

## 2020-08-04 DIAGNOSIS — R0689 Other abnormalities of breathing: Secondary | ICD-10-CM | POA: Diagnosis not present

## 2020-08-04 DIAGNOSIS — R5381 Other malaise: Secondary | ICD-10-CM | POA: Diagnosis not present

## 2020-08-04 DIAGNOSIS — R000 Tachycardia, unspecified: Secondary | ICD-10-CM | POA: Diagnosis not present

## 2020-08-04 DIAGNOSIS — R531 Weakness: Secondary | ICD-10-CM | POA: Diagnosis not present

## 2020-08-05 ENCOUNTER — Other Ambulatory Visit: Payer: Self-pay

## 2020-08-05 ENCOUNTER — Emergency Department (HOSPITAL_COMMUNITY): Payer: Medicare Other

## 2020-08-05 ENCOUNTER — Encounter (HOSPITAL_COMMUNITY): Payer: Self-pay | Admitting: Emergency Medicine

## 2020-08-05 ENCOUNTER — Inpatient Hospital Stay (HOSPITAL_COMMUNITY)
Admission: EM | Admit: 2020-08-05 | Discharge: 2020-08-11 | DRG: 871 | Disposition: A | Discharge status: Hospice | Payer: Medicare Other | Attending: Family Medicine | Admitting: Family Medicine

## 2020-08-05 DIAGNOSIS — K449 Diaphragmatic hernia without obstruction or gangrene: Secondary | ICD-10-CM | POA: Diagnosis not present

## 2020-08-05 DIAGNOSIS — K209 Esophagitis, unspecified without bleeding: Secondary | ICD-10-CM | POA: Diagnosis not present

## 2020-08-05 DIAGNOSIS — R933 Abnormal findings on diagnostic imaging of other parts of digestive tract: Secondary | ICD-10-CM | POA: Diagnosis not present

## 2020-08-05 DIAGNOSIS — Z681 Body mass index (BMI) 19 or less, adult: Secondary | ICD-10-CM | POA: Diagnosis not present

## 2020-08-05 DIAGNOSIS — Z20822 Contact with and (suspected) exposure to covid-19: Secondary | ICD-10-CM | POA: Diagnosis present

## 2020-08-05 DIAGNOSIS — Z7189 Other specified counseling: Secondary | ICD-10-CM

## 2020-08-05 DIAGNOSIS — R1314 Dysphagia, pharyngoesophageal phase: Secondary | ICD-10-CM | POA: Diagnosis present

## 2020-08-05 DIAGNOSIS — R1310 Dysphagia, unspecified: Secondary | ICD-10-CM | POA: Diagnosis not present

## 2020-08-05 DIAGNOSIS — Z8572 Personal history of non-Hodgkin lymphomas: Secondary | ICD-10-CM | POA: Diagnosis not present

## 2020-08-05 DIAGNOSIS — Z7901 Long term (current) use of anticoagulants: Secondary | ICD-10-CM

## 2020-08-05 DIAGNOSIS — J690 Pneumonitis due to inhalation of food and vomit: Secondary | ICD-10-CM | POA: Diagnosis present

## 2020-08-05 DIAGNOSIS — B351 Tinea unguium: Secondary | ICD-10-CM | POA: Insufficient documentation

## 2020-08-05 DIAGNOSIS — Z85831 Personal history of malignant neoplasm of soft tissue: Secondary | ICD-10-CM

## 2020-08-05 DIAGNOSIS — I4891 Unspecified atrial fibrillation: Secondary | ICD-10-CM | POA: Diagnosis not present

## 2020-08-05 DIAGNOSIS — R6520 Severe sepsis without septic shock: Secondary | ICD-10-CM

## 2020-08-05 DIAGNOSIS — Z9221 Personal history of antineoplastic chemotherapy: Secondary | ICD-10-CM | POA: Diagnosis not present

## 2020-08-05 DIAGNOSIS — C7951 Secondary malignant neoplasm of bone: Secondary | ICD-10-CM | POA: Diagnosis present

## 2020-08-05 DIAGNOSIS — M7731 Calcaneal spur, right foot: Secondary | ICD-10-CM | POA: Diagnosis not present

## 2020-08-05 DIAGNOSIS — K861 Other chronic pancreatitis: Secondary | ICD-10-CM | POA: Diagnosis present

## 2020-08-05 DIAGNOSIS — E11311 Type 2 diabetes mellitus with unspecified diabetic retinopathy with macular edema: Secondary | ICD-10-CM | POA: Diagnosis present

## 2020-08-05 DIAGNOSIS — F0390 Unspecified dementia without behavioral disturbance: Secondary | ICD-10-CM | POA: Diagnosis present

## 2020-08-05 DIAGNOSIS — Z9079 Acquired absence of other genital organ(s): Secondary | ICD-10-CM | POA: Diagnosis not present

## 2020-08-05 DIAGNOSIS — J9601 Acute respiratory failure with hypoxia: Secondary | ICD-10-CM | POA: Diagnosis present

## 2020-08-05 DIAGNOSIS — Z8546 Personal history of malignant neoplasm of prostate: Secondary | ICD-10-CM | POA: Diagnosis not present

## 2020-08-05 DIAGNOSIS — C61 Malignant neoplasm of prostate: Secondary | ICD-10-CM | POA: Diagnosis not present

## 2020-08-05 DIAGNOSIS — I96 Gangrene, not elsewhere classified: Secondary | ICD-10-CM | POA: Diagnosis not present

## 2020-08-05 DIAGNOSIS — K3189 Other diseases of stomach and duodenum: Secondary | ICD-10-CM | POA: Diagnosis not present

## 2020-08-05 DIAGNOSIS — Z741 Need for assistance with personal care: Secondary | ICD-10-CM | POA: Insufficient documentation

## 2020-08-05 DIAGNOSIS — G9340 Encephalopathy, unspecified: Secondary | ICD-10-CM | POA: Diagnosis not present

## 2020-08-05 DIAGNOSIS — N401 Enlarged prostate with lower urinary tract symptoms: Secondary | ICD-10-CM | POA: Diagnosis present

## 2020-08-05 DIAGNOSIS — A419 Sepsis, unspecified organism: Principal | ICD-10-CM | POA: Diagnosis present

## 2020-08-05 DIAGNOSIS — I739 Peripheral vascular disease, unspecified: Secondary | ICD-10-CM | POA: Insufficient documentation

## 2020-08-05 DIAGNOSIS — R64 Cachexia: Secondary | ICD-10-CM | POA: Diagnosis present

## 2020-08-05 DIAGNOSIS — K229 Disease of esophagus, unspecified: Secondary | ICD-10-CM | POA: Diagnosis not present

## 2020-08-05 DIAGNOSIS — Z7401 Bed confinement status: Secondary | ICD-10-CM | POA: Diagnosis not present

## 2020-08-05 DIAGNOSIS — K222 Esophageal obstruction: Secondary | ICD-10-CM | POA: Diagnosis present

## 2020-08-05 DIAGNOSIS — Z79899 Other long term (current) drug therapy: Secondary | ICD-10-CM

## 2020-08-05 DIAGNOSIS — S91301A Unspecified open wound, right foot, initial encounter: Secondary | ICD-10-CM | POA: Diagnosis not present

## 2020-08-05 DIAGNOSIS — R0902 Hypoxemia: Secondary | ICD-10-CM | POA: Diagnosis not present

## 2020-08-05 DIAGNOSIS — E860 Dehydration: Secondary | ICD-10-CM | POA: Diagnosis present

## 2020-08-05 DIAGNOSIS — K224 Dyskinesia of esophagus: Secondary | ICD-10-CM | POA: Diagnosis present

## 2020-08-05 DIAGNOSIS — R1319 Other dysphagia: Secondary | ICD-10-CM

## 2020-08-05 DIAGNOSIS — B3789 Other sites of candidiasis: Secondary | ICD-10-CM | POA: Diagnosis present

## 2020-08-05 DIAGNOSIS — E1165 Type 2 diabetes mellitus with hyperglycemia: Secondary | ICD-10-CM | POA: Diagnosis present

## 2020-08-05 DIAGNOSIS — E46 Unspecified protein-calorie malnutrition: Secondary | ICD-10-CM | POA: Diagnosis not present

## 2020-08-05 DIAGNOSIS — K225 Diverticulum of esophagus, acquired: Secondary | ICD-10-CM | POA: Diagnosis present

## 2020-08-05 DIAGNOSIS — Z515 Encounter for palliative care: Secondary | ICD-10-CM

## 2020-08-05 DIAGNOSIS — E1169 Type 2 diabetes mellitus with other specified complication: Secondary | ICD-10-CM

## 2020-08-05 DIAGNOSIS — B3781 Candidal esophagitis: Secondary | ICD-10-CM | POA: Diagnosis present

## 2020-08-05 DIAGNOSIS — E872 Acidosis: Secondary | ICD-10-CM | POA: Diagnosis present

## 2020-08-05 DIAGNOSIS — Q396 Congenital diverticulum of esophagus: Secondary | ICD-10-CM

## 2020-08-05 DIAGNOSIS — Z800 Family history of malignant neoplasm of digestive organs: Secondary | ICD-10-CM | POA: Diagnosis not present

## 2020-08-05 DIAGNOSIS — E1152 Type 2 diabetes mellitus with diabetic peripheral angiopathy with gangrene: Secondary | ICD-10-CM | POA: Diagnosis present

## 2020-08-05 DIAGNOSIS — D638 Anemia in other chronic diseases classified elsewhere: Secondary | ICD-10-CM | POA: Diagnosis present

## 2020-08-05 DIAGNOSIS — E43 Unspecified severe protein-calorie malnutrition: Secondary | ICD-10-CM | POA: Diagnosis present

## 2020-08-05 DIAGNOSIS — Z66 Do not resuscitate: Secondary | ICD-10-CM | POA: Diagnosis present

## 2020-08-05 DIAGNOSIS — J189 Pneumonia, unspecified organism: Secondary | ICD-10-CM | POA: Diagnosis present

## 2020-08-05 DIAGNOSIS — J309 Allergic rhinitis, unspecified: Secondary | ICD-10-CM | POA: Insufficient documentation

## 2020-08-05 DIAGNOSIS — K219 Gastro-esophageal reflux disease without esophagitis: Secondary | ICD-10-CM | POA: Insufficient documentation

## 2020-08-05 DIAGNOSIS — K2100 Gastro-esophageal reflux disease with esophagitis, without bleeding: Secondary | ICD-10-CM | POA: Diagnosis present

## 2020-08-05 DIAGNOSIS — Z85828 Personal history of other malignant neoplasm of skin: Secondary | ICD-10-CM | POA: Diagnosis not present

## 2020-08-05 DIAGNOSIS — I10 Essential (primary) hypertension: Secondary | ICD-10-CM | POA: Diagnosis not present

## 2020-08-05 DIAGNOSIS — K220 Achalasia of cardia: Secondary | ICD-10-CM | POA: Diagnosis present

## 2020-08-05 DIAGNOSIS — Z7984 Long term (current) use of oral hypoglycemic drugs: Secondary | ICD-10-CM

## 2020-08-05 DIAGNOSIS — E876 Hypokalemia: Secondary | ICD-10-CM | POA: Diagnosis present

## 2020-08-05 DIAGNOSIS — R413 Other amnesia: Secondary | ICD-10-CM | POA: Insufficient documentation

## 2020-08-05 DIAGNOSIS — E1142 Type 2 diabetes mellitus with diabetic polyneuropathy: Secondary | ICD-10-CM | POA: Diagnosis not present

## 2020-08-05 DIAGNOSIS — Z7982 Long term (current) use of aspirin: Secondary | ICD-10-CM

## 2020-08-05 DIAGNOSIS — G9341 Metabolic encephalopathy: Secondary | ICD-10-CM | POA: Diagnosis present

## 2020-08-05 DIAGNOSIS — B370 Candidal stomatitis: Secondary | ICD-10-CM | POA: Diagnosis not present

## 2020-08-05 DIAGNOSIS — I480 Paroxysmal atrial fibrillation: Secondary | ICD-10-CM | POA: Diagnosis not present

## 2020-08-05 DIAGNOSIS — E119 Type 2 diabetes mellitus without complications: Secondary | ICD-10-CM

## 2020-08-05 DIAGNOSIS — I517 Cardiomegaly: Secondary | ICD-10-CM | POA: Diagnosis not present

## 2020-08-05 DIAGNOSIS — R531 Weakness: Secondary | ICD-10-CM | POA: Insufficient documentation

## 2020-08-05 DIAGNOSIS — Z89429 Acquired absence of other toe(s), unspecified side: Secondary | ICD-10-CM | POA: Insufficient documentation

## 2020-08-05 LAB — CBC WITH DIFFERENTIAL/PLATELET
Abs Immature Granulocytes: 0.35 10*3/uL — ABNORMAL HIGH (ref 0.00–0.07)
Abs Immature Granulocytes: 0.7 10*3/uL — ABNORMAL HIGH (ref 0.00–0.07)
Basophils Absolute: 0 10*3/uL (ref 0.0–0.1)
Basophils Absolute: 0 10*3/uL (ref 0.0–0.1)
Basophils Relative: 0 %
Basophils Relative: 0 %
Eosinophils Absolute: 0.1 10*3/uL (ref 0.0–0.5)
Eosinophils Absolute: 0.1 10*3/uL (ref 0.0–0.5)
Eosinophils Relative: 0 %
Eosinophils Relative: 1 %
HCT: 22.4 % — ABNORMAL LOW (ref 39.0–52.0)
HCT: 25.5 % — ABNORMAL LOW (ref 39.0–52.0)
Hemoglobin: 7.3 g/dL — ABNORMAL LOW (ref 13.0–17.0)
Hemoglobin: 8.5 g/dL — ABNORMAL LOW (ref 13.0–17.0)
Immature Granulocytes: 2 %
Immature Granulocytes: 4 %
Lymphocytes Relative: 5 %
Lymphocytes Relative: 6 %
Lymphs Abs: 1 10*3/uL (ref 0.7–4.0)
Lymphs Abs: 1.3 10*3/uL (ref 0.7–4.0)
MCH: 27.9 pg (ref 26.0–34.0)
MCH: 28.1 pg (ref 26.0–34.0)
MCHC: 32.6 g/dL (ref 30.0–36.0)
MCHC: 33.3 g/dL (ref 30.0–36.0)
MCV: 84.4 fL (ref 80.0–100.0)
MCV: 85.5 fL (ref 80.0–100.0)
Monocytes Absolute: 2.2 10*3/uL — ABNORMAL HIGH (ref 0.1–1.0)
Monocytes Absolute: 2.7 10*3/uL — ABNORMAL HIGH (ref 0.1–1.0)
Monocytes Relative: 11 %
Monocytes Relative: 13 %
Neutro Abs: 15.2 10*3/uL — ABNORMAL HIGH (ref 1.7–7.7)
Neutro Abs: 16.2 10*3/uL — ABNORMAL HIGH (ref 1.7–7.7)
Neutrophils Relative %: 79 %
Neutrophils Relative %: 79 %
Platelets: 261 10*3/uL (ref 150–400)
Platelets: 378 10*3/uL (ref 150–400)
RBC: 2.62 MIL/uL — ABNORMAL LOW (ref 4.22–5.81)
RBC: 3.02 MIL/uL — ABNORMAL LOW (ref 4.22–5.81)
RDW: 13.5 % (ref 11.5–15.5)
RDW: 13.9 % (ref 11.5–15.5)
WBC: 19.3 10*3/uL — ABNORMAL HIGH (ref 4.0–10.5)
WBC: 20.6 10*3/uL — ABNORMAL HIGH (ref 4.0–10.5)
nRBC: 0 % (ref 0.0–0.2)
nRBC: 0 % (ref 0.0–0.2)

## 2020-08-05 LAB — COMPREHENSIVE METABOLIC PANEL
ALT: 9 U/L (ref 0–44)
ALT: 8 U/L (ref 0–44)
AST: 11 U/L — ABNORMAL LOW (ref 15–41)
AST: 12 U/L — ABNORMAL LOW (ref 15–41)
Albumin: 2.9 g/dL — ABNORMAL LOW (ref 3.5–5.0)
Albumin: 2.4 g/dL — ABNORMAL LOW (ref 3.5–5.0)
Alkaline Phosphatase: 81 U/L (ref 38–126)
Alkaline Phosphatase: 64 U/L (ref 38–126)
Anion gap: 11 (ref 5–15)
Anion gap: 7 (ref 5–15)
BUN: 27 mg/dL — ABNORMAL HIGH (ref 8–23)
BUN: 25 mg/dL — ABNORMAL HIGH (ref 8–23)
CO2: 15 mmol/L — ABNORMAL LOW (ref 22–32)
CO2: 17 mmol/L — ABNORMAL LOW (ref 22–32)
Calcium: 8.3 mg/dL — ABNORMAL LOW (ref 8.9–10.3)
Calcium: 7.6 mg/dL — ABNORMAL LOW (ref 8.9–10.3)
Chloride: 112 mmol/L — ABNORMAL HIGH (ref 98–111)
Chloride: 113 mmol/L — ABNORMAL HIGH (ref 98–111)
Creatinine, Ser: 1.13 mg/dL (ref 0.61–1.24)
Creatinine, Ser: 1 mg/dL (ref 0.61–1.24)
GFR, Estimated: >60 mL/min (ref >60)
GFR, Estimated: >60 mL/min (ref >60)
Glucose, Bld: 188 mg/dL — ABNORMAL HIGH (ref 70–99)
Glucose, Bld: 164 mg/dL — ABNORMAL HIGH (ref 70–99)
Potassium: 4 mmol/L (ref 3.5–5.1)
Potassium: 4.1 mmol/L (ref 3.5–5.1)
Sodium: 138 mmol/L (ref 135–145)
Sodium: 137 mmol/L (ref 135–145)
Total Bilirubin: 0.6 mg/dL (ref 0.3–1.2)
Total Bilirubin: 0.5 mg/dL (ref 0.3–1.2)
Total Protein: 6.8 g/dL (ref 6.5–8.1)
Total Protein: 5.6 g/dL — ABNORMAL LOW (ref 6.5–8.1)

## 2020-08-05 LAB — MAGNESIUM: Magnesium: 0.5 mg/dL — CL (ref 1.7–2.4)

## 2020-08-05 LAB — TROPONIN I (HIGH SENSITIVITY)
Troponin I (High Sensitivity): 32 ng/L — ABNORMAL HIGH (ref <18)
Troponin I (High Sensitivity): 29 ng/L — ABNORMAL HIGH (ref <18)

## 2020-08-05 LAB — URINALYSIS, ROUTINE W REFLEX MICROSCOPIC
Bilirubin Urine: NEGATIVE
Glucose, UA: NEGATIVE mg/dL
Ketones, ur: 5 mg/dL — AB
Nitrite: NEGATIVE
Protein, ur: 30 mg/dL — AB
Specific Gravity, Urine: 1.017 (ref 1.005–1.030)
pH: 5 (ref 5.0–8.0)

## 2020-08-05 LAB — BLOOD GAS, VENOUS
Acid-base deficit: 7.4 mmol/L — ABNORMAL HIGH (ref 0.0–2.0)
Bicarbonate: 18.2 mmol/L — ABNORMAL LOW (ref 20.0–28.0)
FIO2: 21
O2 Saturation: 76 %
Patient temperature: 37.8
pCO2, Ven: 32 mmHg — ABNORMAL LOW (ref 44.0–60.0)
pH, Ven: 7.349 (ref 7.250–7.430)
pO2, Ven: 45.1 mmHg — ABNORMAL HIGH (ref 32.0–45.0)

## 2020-08-05 LAB — RESP PANEL BY RT-PCR (FLU A&B, COVID) ARPGX2
Influenza A by PCR: NEGATIVE
Influenza B by PCR: NEGATIVE
SARS Coronavirus 2 by RT PCR: NEGATIVE

## 2020-08-05 LAB — PROTIME-INR
INR: 2.1 — ABNORMAL HIGH (ref 0.8–1.2)
INR: 2.1 — ABNORMAL HIGH (ref 0.8–1.2)
Prothrombin Time: 23.5 seconds — ABNORMAL HIGH (ref 11.4–15.2)
Prothrombin Time: 23.5 seconds — ABNORMAL HIGH (ref 11.4–15.2)

## 2020-08-05 LAB — MRSA PCR SCREENING: MRSA by PCR: NEGATIVE

## 2020-08-05 LAB — PROCALCITONIN: Procalcitonin: 1.8 ng/mL

## 2020-08-05 LAB — LACTIC ACID, PLASMA
Lactic Acid, Venous: 1.2 mmol/L (ref 0.5–1.9)
Lactic Acid, Venous: 1.5 mmol/L (ref 0.5–1.9)

## 2020-08-05 LAB — GLUCOSE, CAPILLARY
Glucose-Capillary: 110 mg/dL — ABNORMAL HIGH (ref 70–99)
Glucose-Capillary: 141 mg/dL — ABNORMAL HIGH (ref 70–99)

## 2020-08-05 LAB — CORTISOL-AM, BLOOD: Cortisol - AM: >100 ug/dL — ABNORMAL HIGH (ref 6.7–22.6)

## 2020-08-05 LAB — APTT: aPTT: 41 seconds — ABNORMAL HIGH (ref 24–36)

## 2020-08-05 MED ORDER — PANTOPRAZOLE SODIUM 40 MG PO TBEC
40.0000 mg | DELAYED_RELEASE_TABLET | Freq: Every day | ORAL | Status: DC
  Filled 2020-08-05: qty 1

## 2020-08-05 MED ORDER — DILTIAZEM HCL 30 MG PO TABS
30.0000 mg | ORAL_TABLET | Freq: Four times a day (QID) | ORAL | Status: DC
  Administered 2020-08-05 – 2020-08-11 (×19): 30 mg via ORAL
  Filled 2020-08-05 (×19): qty 1

## 2020-08-05 MED ORDER — SODIUM CHLORIDE 0.9 % IV SOLN: 2.0000 g | Freq: Once | INTRAVENOUS | Status: DC

## 2020-08-05 MED ORDER — ACETAMINOPHEN 650 MG RE SUPP
650.0000 mg | Freq: Once | RECTAL | Status: AC
  Administered 2020-08-05: 650 mg via RECTAL
  Filled 2020-08-05: qty 1

## 2020-08-05 MED ORDER — LACTATED RINGERS IV SOLN: INTRAVENOUS | Status: AC

## 2020-08-05 MED ORDER — ONDANSETRON HCL 4 MG/2ML IJ SOLN: 4.0000 mg | Freq: Four times a day (QID) | INTRAMUSCULAR | Status: DC | PRN

## 2020-08-05 MED ORDER — PANTOPRAZOLE SODIUM 40 MG PO PACK
40.0000 mg | PACK | Freq: Every day | ORAL | Status: DC
  Administered 2020-08-05 – 2020-08-11 (×6): 40 mg
  Filled 2020-08-05 (×6): qty 20

## 2020-08-05 MED ORDER — SODIUM CHLORIDE 0.9 % IV SOLN
2.0000 g | Freq: Once | INTRAVENOUS | Status: AC
  Administered 2020-08-05: 2 g via INTRAVENOUS
  Filled 2020-08-05: qty 2

## 2020-08-05 MED ORDER — ORAL CARE MOUTH RINSE
15.0000 mL | Freq: Two times a day (BID) | OROMUCOSAL | Status: DC
  Administered 2020-08-05 – 2020-08-11 (×12): 15 mL via OROMUCOSAL

## 2020-08-05 MED ORDER — METRONIDAZOLE IN NACL 5-0.79 MG/ML-% IV SOLN
500.0000 mg | Freq: Once | INTRAVENOUS | Status: AC
  Administered 2020-08-05: 500 mg via INTRAVENOUS
  Filled 2020-08-05: qty 100

## 2020-08-05 MED ORDER — MAGNESIUM SULFATE 2 GM/50ML IV SOLN
2.0000 g | Freq: Once | INTRAVENOUS | Status: AC
  Administered 2020-08-05: 2 g via INTRAVENOUS
  Filled 2020-08-05: qty 50

## 2020-08-05 MED ORDER — LACTATED RINGERS IV BOLUS
1000.0000 mL | Freq: Once | INTRAVENOUS | Status: AC
  Administered 2020-08-05: 1000 mL via INTRAVENOUS

## 2020-08-05 MED ORDER — HYDROCORTISONE NA SUCCINATE PF 100 MG IJ SOLR
50.0000 mg | Freq: Four times a day (QID) | INTRAMUSCULAR | Status: DC
  Administered 2020-08-05 – 2020-08-06 (×5): 50 mg via INTRAVENOUS
  Filled 2020-08-05 (×5): qty 2

## 2020-08-05 MED ORDER — LACTATED RINGERS IV BOLUS (SEPSIS)
1000.0000 mL | Freq: Once | INTRAVENOUS | Status: AC
  Administered 2020-08-05: 1000 mL via INTRAVENOUS

## 2020-08-05 MED ORDER — DILTIAZEM HCL ER 60 MG PO CP12
60.0000 mg | ORAL_CAPSULE | Freq: Two times a day (BID) | ORAL | Status: DC
  Filled 2020-08-05 (×5): qty 1

## 2020-08-05 MED ORDER — MIRTAZAPINE 15 MG PO TABS
15.0000 mg | ORAL_TABLET | Freq: Every day | ORAL | Status: DC
  Administered 2020-08-05 – 2020-08-10 (×6): 15 mg via ORAL
  Filled 2020-08-05 (×6): qty 1

## 2020-08-05 MED ORDER — ONDANSETRON HCL 4 MG PO TABS: 4.0000 mg | ORAL_TABLET | Freq: Four times a day (QID) | ORAL | Status: DC | PRN

## 2020-08-05 MED ORDER — DONEPEZIL HCL 5 MG PO TABS
10.0000 mg | ORAL_TABLET | Freq: Every day | ORAL | Status: DC
  Administered 2020-08-05 – 2020-08-11 (×6): 10 mg via ORAL
  Filled 2020-08-05 (×6): qty 2

## 2020-08-05 MED ORDER — INSULIN ASPART 100 UNIT/ML ~~LOC~~ SOLN
0.0000 [IU] | Freq: Three times a day (TID) | SUBCUTANEOUS | Status: DC
  Administered 2020-08-05 (×2): 2 [IU] via SUBCUTANEOUS
  Administered 2020-08-06: 7 [IU] via SUBCUTANEOUS
  Administered 2020-08-06: 2 [IU] via SUBCUTANEOUS
  Administered 2020-08-06 – 2020-08-07 (×3): 3 [IU] via SUBCUTANEOUS
  Administered 2020-08-07 – 2020-08-09 (×3): 2 [IU] via SUBCUTANEOUS
  Administered 2020-08-09: 5 [IU] via SUBCUTANEOUS
  Administered 2020-08-10 – 2020-08-11 (×2): 1 [IU] via SUBCUTANEOUS

## 2020-08-05 MED ORDER — SODIUM CHLORIDE 0.9 % IV SOLN
2.0000 g | Freq: Two times a day (BID) | INTRAVENOUS | Status: DC
  Administered 2020-08-05 – 2020-08-10 (×12): 2 g via INTRAVENOUS
  Filled 2020-08-05 (×14): qty 2

## 2020-08-05 MED ORDER — ACETAMINOPHEN 325 MG PO TABS
650.0000 mg | ORAL_TABLET | Freq: Four times a day (QID) | ORAL | Status: DC | PRN
  Administered 2020-08-06 – 2020-08-07 (×3): 650 mg via ORAL
  Filled 2020-08-05 (×3): qty 2

## 2020-08-05 MED ORDER — METRONIDAZOLE IN NACL 5-0.79 MG/ML-% IV SOLN
500.0000 mg | Freq: Three times a day (TID) | INTRAVENOUS | Status: DC
  Administered 2020-08-05 – 2020-08-11 (×18): 500 mg via INTRAVENOUS
  Filled 2020-08-05 (×19): qty 100

## 2020-08-05 MED ORDER — APIXABAN 5 MG PO TABS
5.0000 mg | ORAL_TABLET | Freq: Two times a day (BID) | ORAL | Status: DC
  Administered 2020-08-05 – 2020-08-06 (×3): 5 mg via ORAL
  Filled 2020-08-05 (×3): qty 1

## 2020-08-05 MED ORDER — INSULIN ASPART 100 UNIT/ML ~~LOC~~ SOLN
0.0000 [IU] | Freq: Every day | SUBCUTANEOUS | Status: DC
  Administered 2020-08-06: 3 [IU] via SUBCUTANEOUS
  Administered 2020-08-09: 2 [IU] via SUBCUTANEOUS

## 2020-08-05 MED ORDER — VANCOMYCIN HCL IN DEXTROSE 1-5 GM/200ML-% IV SOLN
1000.0000 mg | Freq: Once | INTRAVENOUS | Status: AC
  Administered 2020-08-05: 1000 mg via INTRAVENOUS
  Filled 2020-08-05: qty 200

## 2020-08-05 MED ORDER — ASPIRIN 81 MG PO CHEW
81.0000 mg | CHEWABLE_TABLET | Freq: Every day | ORAL | Status: DC
  Administered 2020-08-05 – 2020-08-10 (×5): 81 mg via ORAL
  Filled 2020-08-05 (×6): qty 1

## 2020-08-05 MED ORDER — CHLORHEXIDINE GLUCONATE CLOTH 2 % EX PADS
6.0000 | MEDICATED_PAD | Freq: Every day | CUTANEOUS | Status: DC
  Administered 2020-08-05 – 2020-08-09 (×5): 6 via TOPICAL

## 2020-08-05 MED ORDER — VANCOMYCIN HCL IN DEXTROSE 1-5 GM/200ML-% IV SOLN: 1000.0000 mg | INTRAVENOUS | Status: DC

## 2020-08-05 MED ORDER — ACETAMINOPHEN 650 MG RE SUPP: 650.0000 mg | Freq: Four times a day (QID) | RECTAL | Status: DC | PRN

## 2020-08-05 MED ORDER — DM-GUAIFENESIN ER 30-600 MG PO TB12
1.0000 | ORAL_TABLET | Freq: Every day | ORAL | Status: DC
  Administered 2020-08-09 – 2020-08-11 (×3): 1 via ORAL
  Filled 2020-08-05 (×6): qty 1

## 2020-08-05 NOTE — ED Provider Notes (Signed)
[name]   CSN: [id number] Arrival date & time: 08/05/20  0003     History No chief complaint on file.   [name] is a 87 y.o. male.  Level 5 caveat for acuity of condition.  Patient brought from home with difficulty breathing and fever.  States he has having difficulty breathing for the past several months.  Also has a wound to his right great toe. Patient confused unable to give a history. He does have a history of peripheral artery disease, cancer, dementia, atrial fibrillation on Eliquis, diabetes and chronic gangrene of right great toe.  Additional history obtained from daughter [name] at bedside.  Patient has swallowing difficulties and needs a pured diet.  He recently had a swallowing study that showed he had an esophageal diverticulum.  He is supposed to have an EGD in a few weeks for this.  There is concern for aspiration.  Patient also has had infection to his right foot for quite some time was diagnosed with cellulitis last week.  He has been on doxycycline for the past 4 days.  He has missed several doses of the antibiotic at home.  Today patient had increased difficulty breathing and choking episodes with swallowing and became more short of breath and EMS was activated. He had a worsening cough today with increasing shortness of breath and fever up to 101 at home.  He had generalized weakness and difficulty walking so EMS was activated. Patient's daughter states he recently had a swallowing study done by GI that showed an esophageal diverticulum. He has been more confused than usual and have decreased activity over the past several days  The history is provided by the patient. The history is limited by the condition of the patient.       Past Medical History:  Diagnosis Date  . Acute bronchitis   . Acute lower UTI 11/25/2017  . Amputation of toe of left foot ([place]) 07/30/2017, 08/27/2017    Great toe 07/30/2017 Second toe  08/27/2017   . Arthritis   . Aspiration pneumonia ([place]) 01/31/2018  . Atrial fibrillation ([place])    Dr. Harl Bowie- [facility] follows saw 11'14  . Bladder stones    tx. with oral meds and antibiotics, now surgery planned  . Bone metastases ([place]) 08/21/2015  . Cataracts, both eyes    surgery planned May 2015  . Community acquired pneumonia 04/16/2018  . Cystoid macular edema 04/21/2015    Right eye 04/21/2015   . Dehydration 04/16/2018  . Dementia without behavioral disturbance ([place]) 04/16/2018  . Diabetes mellitus without complication ([place])    Type II  . Diarrhea 11/25/2017  . Dyspnea    with activity  . Dysrhythmia    Afib  . Family history of breast cancer   . Family history of colon cancer   . Genetic testing 02/09/2017  . GERD (gastroesophageal reflux disease)   . 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 Margot Chimes, thus far without recurrence.   Marland Kitchen History of MRI of lumbar spine 03/05/2014  . Hypertension   . Hypertrophy of prostate with urinary obstruction   . Hypomagnesemia 03/16/2017  . Metabolic encephalopathy 96/[id number]/9381  . Metastatic adenocarcinoma to prostate ([place]) 09/08/2013  . 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 NHL (  non-Hodgkin's lymphoma) ([place]) 2005   Diffuse large B-cell lymphoma, clinically stage IIIA, CD20 positive, status post cervical lymph node biopsy 08/03/2003 on the left. PET scan was also positive in the spleen and small bowel region, but bone marrow aspiration and biopsy were negative. So he essentially had stage IIIAs. He received R-CHOP x6 cycles with CR established by PET scan criteria on 11/23/2003 with no evidence for relapse th  . Numbness 02/19/2017   Per [facility] new patient packet  . Osteomyelitis ([place])  06/29/2017   toe of left foot   . PAD (peripheral artery disease) ([place]) 08/27/2017  . Polyneuropathy 02/19/2017  . Postural dizziness with presyncope 02/19/2017  . S/P TURP 08/10/2013  . Sepsis ([place]) 07/02/2018  . Skin cancer    Basal cell- face,head, neck,  arms, legs, Back  . Urinary frequency 02/19/2017    Patient Active Problem List   Diagnosis Date Noted  . Palliative care by specialist   . Goals of care, counseling/discussion   . Encounter for hospice care discussion   . DNR (do not resuscitate)   . DNI (do not intubate)   . Toe ulcer ([place]) 09/29/2019  . Pressure injury of skin 09/22/2019  . Vagal reaction 09/20/2019  . Cerumen debris on tympanic membrane of both ears 08/24/2019  . Type 1 diabetes mellitus with diabetic neuropathy, unspecified ([place]) 02/22/2019  . Memory loss, short term 02/22/2019  . Sepsis secondary to UTI ([place]) 07/02/2018  . HCAP (healthcare-associated pneumonia) 07/02/2018  . CAP (community acquired pneumonia) 04/16/2018  . Dementia without behavioral disturbance ([place]) 04/16/2018  . Dehydration 04/16/2018  . Aspiration pneumonia ([place]) 01/31/2018  . Diarrhea 11/25/2017  . PAD (peripheral artery disease) ([place]) 08/27/2017  . Great toe amputation status, left 07/30/2017  . Osteomyelitis of toe of left foot ([place]) 06/29/2017  . Acute bronchitis with bronchospasm 03/16/2017  . Type 2 diabetes mellitus ([place]) 03/16/2017  . Hypomagnesemia 03/16/2017  . 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 Postural dizziness with presyncope 02/19/2017  . Polyneuropathy 02/19/2017  . Numbness 02/19/2017  . Urinary frequency 02/19/2017  . Genetic testing 02/09/2017  . Family history of breast cancer   . Family history of colon cancer   . Dysphagia 01/21/2016  . Bone metastases ([place]) 08/21/2015  . Prostate cancer ([place]) 09/08/2013  . Hypertrophy of prostate with urinary obstruction and other lower urinary tract symptoms (LUTS) 08/10/2013  . NHL (non-Hodgkin's  lymphoma) ([place]) 08/18/2011  . GIST (gastrointestinal stromal tumor), malignant ([place]) 08/18/2011  . Long term (current) use of anticoagulants 07/18/2010  . Essential hypertension 10/04/2008  . ATRIAL FIBRILLATION 10/04/2008    Past Surgical History:  Procedure Laterality Date  . ABDOMINAL AORTOGRAM W/LOWER EXTREMITY N/A 09/20/2019   Procedure: ABDOMINAL AORTOGRAM W/LOWER EXTREMITY;  Surgeon: Marty Heck, MD;  Location: [facility];  Service: Cardiovascular;  Laterality: N/A;  . AMPUTATION Left 06/29/2017   Procedure: AMPUTATION LEFT GREAT TOE;  Surgeon: [name], MD;  Location: [facility];  Service: Vascular;  Laterality: Left;  . AMPUTATION Left 08/27/2017   Procedure: AMPUTATION Left SECOND TOE;  Surgeon: [name], MD;  Location: [facility];  Service: Vascular;  Laterality: Left;  . CATARACT EXTRACTION Bilateral 2015  . CHOLECYSTECTOMY    . COLON RESECTION     small bowel  . CYSTOSCOPY WITH LITHOLAPAXY N/A 08/10/2013   Procedure: CYSTOSCOPY WITH LITHOLAPAXY WITH [name];  Surgeon: [name], MD;  [facility];  Service: Urology;  Laterality: N/A;  . ESOPHAGEAL DILATION N/A  02/27/2016   Procedure: ESOPHAGEAL DILATION;  Surgeon: [name], MD;  [facility];  Service: Endoscopy;  Laterality: N/A;  . ESOPHAGEAL DILATION N/A 07/07/2018   Procedure: ESOPHAGEAL DILATION;  Surgeon: [name], MD;  [facility];  Service: Endoscopy;  Laterality: N/A;  . ESOPHAGOGASTRODUODENOSCOPY N/A 02/27/2016   Procedure: ESOPHAGOGASTRODUODENOSCOPY (EGD);  Surgeon: [name], MD;  [facility];  Service: Endoscopy;  Laterality: N/A;  12:00  . ESOPHAGOGASTRODUODENOSCOPY N/A 07/07/2018   Procedure: ESOPHAGOGASTRODUODENOSCOPY (EGD);  Surgeon: [name], MD;  [facility];  Service: Endoscopy;  Laterality: N/A;  . INCISION AND DRAINAGE PERIRECTAL ABSCESS    . LOWER EXTREMITY ANGIOGRAPHY N/A 06/25/2017    Procedure: LOWER EXTREMITY ANGIOGRAPHY;  Surgeon: [name], MD;  Location: [facility];  Service: Cardiovascular;  Laterality: N/A;  . LYMPH NODE DISSECTION Left    '05-neck  . PERIPHERAL VASCULAR ATHERECTOMY Right 09/20/2019   Procedure: PERIPHERAL VASCULAR ATHERECTOMY;  Surgeon: Marty Heck, MD;  Location: [facility];  Service: Cardiovascular;  Laterality: Right;  Posterior tibial  . PORT-A-CATH REMOVAL    . PORTACATH PLACEMENT     insertion and removal -last chemotherapy 10 yrs ago  . TRANSURETHRAL RESECTION OF PROSTATE N/A 08/10/2013   Procedure: TRANSURETHRAL RESECTION OF THE PROSTATE WITH GYRUS INSTRUMENTS;  Surgeon: [name], MD;  [facility];  Service: Urology;  Laterality: N/A;       Family History  Problem Relation Age of Onset  . Heart attack Mother        died in her 58s  . Other Father 38       pneumonia - died  . Breast cancer Sister        dx in her 72s-60s  . Colon cancer Brother        dx in his 53s  . Cancer Sister        TAH/BSO due to "male cancer"  . Breast cancer Daughter 58  . Breast cancer Daughter 25       DCIS - ATM VUS on [employer] 83 gene panel in 2018  . Breast cancer Daughter 59       DCIS - Negative on [employer] MyRisk 25 gene apnel in 2015  . Thyroid cancer Daughter 41       Medullary and Papillary  . Thyroid nodules Grandchild     Social History   Tobacco Use  . Smoking status: Never Smoker  . Smokeless tobacco: Never Used  Vaping Use  . Vaping Use: Never used  Substance Use Topics  . Alcohol use: No  . Drug use: No    Home Medications Prior to Admission medications   Medication Sig Start Date End Date Taking? Authorizing Provider  acetaminophen (TYLENOL) 500 MG tablet Take 500 mg by mouth at bedtime.    [provider]  albuterol (2.5 MG/3ML) 0.083% NEBU 3 mL, albuterol (5 MG/ML) 0.5% NEBU 0.5 mL Inhale into the lungs 3 (three) times daily as needed.    [provider]   apixaban (ELIQUIS) 5 MG TABS tablet Take 1 tablet (5 mg total) by mouth 2 (two) times daily. 04/15/20   Arnoldo Lenis, MD  Aspirin 81 MG CAPS Take 81 mg by mouth daily.     [provider]  blood glucose meter kit and supplies Dispense based on patient and insurance preference. Use four times daily as directed. (FOR ICD-10 E10.9, E11.9). 03/30/19   Corum,  Rex Kras, MD  cephALEXin (KEFLEX) 250 MG capsule Take 250 mg by mouth at bedtime. Preventative for UTI prescribed by urology    [provider]  Cholecalciferol (VITAMIN D3) 1000 units CAPS Take 1,000 Units by mouth daily.     [provider]  Cranberry 500 MG TABS Take 500 mg by mouth daily. Patient not taking: Reported on 08/01/2020    [provider]  denosumab (XGEVA) 120 MG/1.7ML SOLN injection Inject 120 mg into the skin every 3 (three) months.    [provider]  Dextromethorphan-guaiFENesin [facility] DM PO) Take by mouth daily.    [provider]  diclofenac Sodium (VOLTAREN) 1 % GEL Apply 2 g topically as needed.    [provider]  diltiazem (CARDIZEM SR) 60 MG 12 hr capsule Take 1 capsule (60 mg total) by mouth 2 (two) times daily. 05/14/20   Imogene Burn, PA-C  donepezil (ARICEPT) 10 MG tablet Take 1 tablet by mouth once daily 06/11/20   [name], NP  doxycycline (VIBRA-TABS) 100 MG tablet Take 1 tablet (100 mg total) by mouth 2 (two) times daily for 14 days. 08/01/20 08/15/20  Medina-Vargas, Monina C, NP  furosemide (LASIX) 20 MG tablet Take 0.5 tablets (10 mg total) by mouth daily. Take for 5 day, then change to as needed for shortness of breath for pleural effusion. 04/16/20   Ngetich, Dinah C, NP  glipiZIDE (GLUCOTROL XL) 5 MG 24 hr tablet Take 1 tablet (5 mg total) by mouth daily with breakfast. 07/29/20   [name], NP  glucose blood (EASYMAX TEST) test strip Use as instructed 08/24/19   Maryruth Hancock, MD  Lancets 28G MISC 1 kit by Does not apply  route every morning. 07/18/19   Maryruth Hancock, MD  lisinopril (ZESTRIL) 20 MG tablet Take 1 tablet by mouth once daily 07/08/20   Arnoldo Lenis, MD  metFORMIN (GLUCOPHAGE) 850 MG tablet Take 1 tablet (850 mg total) by mouth 2 (two) times daily with a meal. 05/31/20   [name], NP  mirtazapine (REMERON) 15 MG tablet Take 15 mg by mouth at bedtime.    [provider]  Multiple Vitamin (MULTIVITAMIN WITH MINERALS) TABS tablet Take 1 tablet by mouth daily. Patient not taking: Reported on 08/01/2020    [provider]  omeprazole (PRILOSEC) 40 MG capsule Take 1 capsule (40 mg total) by mouth daily. 06/18/20   [name], NP  Probiotic Product (PROBIOTIC DAILY PO) Take 1 tablet by mouth daily. Every morning     [provider]  simethicone (MYLICON) 80 MG chewable tablet Chew 80 mg by mouth daily.    [provider]    Allergies    Tape and Augmentin [amoxicillin-pot clavulanate]  Review of Systems   Review of Systems  Unable to perform ROS: Dementia    Physical Exam Updated Vital Signs BP 93/61   Foster (!) 103   Temp (!) 100.9 F (38.3 C) (Rectal)   Resp (!) 21   Ht _0  (1.778 m)   Wt 60.8 kg   SpO2 100%   BMI 19.23 kg/m   Physical Exam Vitals and nursing note reviewed.  Constitutional:      General: He is in acute distress.     Appearance: He is well-developed. He is ill-appearing.     Comments: Tachypneic, ill-appearing  HENT:     Head: Normocephalic and atraumatic.     Mouth/Throat:     Pharynx: No  oropharyngeal exudate.  Eyes:     Conjunctiva/sclera: Conjunctivae normal.     Pupils: Pupils are equal, round, and reactive to light.  Neck:     Comments: No meningismus. Cardiovascular:     Rate and Rhythm: Tachycardia present. Rhythm irregular.     Heart sounds: Normal heart sounds. No murmur heard.     Comments: Irregular heart rate 130s to 150s Pulmonary:     Effort: Respiratory distress present.     Breath  sounds: Rhonchi present.     Comments: Tachypnea to 30s, rhonchi throughout Abdominal:     Palpations: Abdomen is soft.     Tenderness: There is no abdominal tenderness. There is no guarding or rebound.  Musculoskeletal:        General: Tenderness and deformity present. Normal range of motion.     Cervical back: Normal range of motion and neck supple.     Comments:  Gangrenous right great toe as depicted.  Surrounding erythema right forefoot.  Intact PT Foster with Doppler  Left great toe amputation.  Skin:    General: Skin is warm.  Neurological:     Mental Status: He is alert.     Motor: No abnormal muscle tone.     Comments: Oriented to person and place, moves all extremities Slow to respond, answers a few questions with yes or no.  Moves all extremities equally.  Psychiatric:        Behavior: Behavior normal.       ED Results / Procedures / Treatments   Labs (all labs ordered are listed, but only abnormal results are displayed) Labs Reviewed  COMPREHENSIVE METABOLIC PANEL - Abnormal; Notable for the following components:      Result Value   Chloride 112 (*)    CO2 15 (*)    Glucose, Bld 188 (*)    BUN 27 (*)    Calcium 8.3 (*)    Albumin 2.9 (*)    AST 11 (*)    All other components within normal limits  CBC WITH DIFFERENTIAL/PLATELET - Abnormal; Notable for the following components:   WBC 19.3 (*)    RBC 3.02 (*)    Hemoglobin 8.5 (*)    HCT 25.5 (*)    Neutro Abs 15.2 (*)    Monocytes Absolute 2.2 (*)    Abs Immature Granulocytes 0.70 (*)    All other components within normal limits  PROTIME-INR - Abnormal; Notable for the following components:   Prothrombin Time 23.5 (*)    INR 2.1 (*)    All other components within normal limits  APTT - Abnormal; Notable for the following components:   aPTT 41 (*)    All other components within normal limits  RESP PANEL BY RT-PCR (FLU A&B, COVID) [id number]  CULTURE, BLOOD (ROUTINE X 2)  CULTURE, BLOOD (ROUTINE X 2)   URINE CULTURE  EXPECTORATED SPUTUM ASSESSMENT W GRAM STAIN, RFLX TO RESP C  LACTIC ACID, PLASMA  LACTIC ACID, PLASMA  BLOOD GAS, VENOUS  HEMOGLOBIN A1C  PROTIME-INR  CORTISOL-AM, BLOOD  PROCALCITONIN  COMPREHENSIVE METABOLIC PANEL  MAGNESIUM  CBC WITH DIFFERENTIAL/PLATELET  URINALYSIS, ROUTINE W REFLEX MICROSCOPIC  TROPONIN I (HIGH SENSITIVITY)  TROPONIN I (HIGH SENSITIVITY)    EKG EKG Interpretation  Date/Time:  Monday August 05 2020 00:29:17 EDT Ventricular Rate:  131 PR Interval:    QRS Duration: 139 QT Interval:  355 QTC Calculation: 489 R Axis:   99 Text Interpretation: duplicate, discard Confirmed by Delora Fuel ([id number]) on 08/05/2020 2:53:11  AM   Radiology DG Chest Port 1 View  Result Date: 08/05/2020 CLINICAL DATA:  Questionable sepsis EXAM: PORTABLE CHEST 1 VIEW COMPARISON:  None. FINDINGS: Cardiomegaly. Left lower lobe airspace opacity, likely pneumonia. No confluent opacity on the right. No effusions. No acute bony abnormality. IMPRESSION: Left lower lobe airspace opacity concerning for pneumonia. Electronically Signed   By: Rolm Baptise M.D.   On: 08/05/2020 00:45   DG Foot Complete Right  Result Date: 08/05/2020 CLINICAL DATA:  Wound on right heel. EXAM: RIGHT FOOT COMPLETE - 3+ VIEW COMPARISON:  None. FINDINGS: Small plantar calcaneal spur. No bone destruction to suggest osteomyelitis. No acute bony abnormality. Specifically, no fracture, subluxation, or dislocation. Diffuse vascular calcifications. IMPRESSION: No acute bony abnormality. Electronically Signed   By: Rolm Baptise M.D.   On: 08/05/2020 00:45    Procedures .Critical Care Performed by: Ezequiel Essex, MD Authorized by: Ezequiel Essex, MD   Critical care provider statement:    Critical care time (minutes):  60   Critical care was necessary to treat or prevent imminent or life-threatening deterioration of the following conditions:  Sepsis   Critical care was time spent personally by me on  the following activities:  Discussions with consultants, evaluation of patient's response to treatment, examination of patient, ordering and performing treatments and interventions, ordering and review of laboratory studies, ordering and review of radiographic studies, Foster oximetry, re-evaluation of patient's condition, obtaining history from patient or surrogate and review of old charts     Medications Ordered in ED Medications  lactated ringers infusion (has no administration in time range)  lactated ringers bolus 1,000 mL (has no administration in time range)  metroNIDAZOLE (FLAGYL) IVPB 500 mg (has no administration in time range)  vancomycin (VANCOCIN) IVPB 1000 mg/200 mL premix (has no administration in time range)  ceFEPIme (MAXIPIME) 2 g in sodium chloride 0.9 % 100 mL IVPB (has no administration in time range)  lactated ringers bolus 1,000 mL (has no administration in time range)    ED Course  I have reviewed the triage vital signs and the nursing notes.  Pertinent labs & imaging results that were available during my care of the patient were reviewed by me and considered in my medical decision making (see chart for details).    MDM Rules/Calculators/A&P                           Difficulty breathing with fever, shortness of breath and tachycardia.  Code sepsis activated on arrival.  Concern for likely aspiration pneumonia  Patient started on broad-spectrum antibiotics and IV fluids after cultures obtained  Lactate is 1.5.  White count elevated.  Patient with fever, tachycardia, tachypnea and hypotension.  Code sepsis activated.  Patient complaining of feeling cold but denies any pain.  His blood pressure has improved to 108/95.  He is given aggressive IV fluid resuscitation.  Initial lactate is normal.  Discussed with his daughter [name] at bedside.  She states he is a DNR/DNI and would not want any aggressive life-saving measures.  They are agreeable to vasopressors if  necessary but no central line.  Patient will need admission for sepsis from aspiration pneumonia as well as foot cellulitis and gangrene  Labs show leukocytosis but lactate is normal.  He is given vancomycin, cefepime and Flagyl.  Given IV fluid bolus.  Suspect his sepsis is likely secondary to his aspiration pneumonia more so than his gangrenous toe. Rapid A. fib with RVR  likely triggered by sepsis.  Blood pressure improving to the 100s.  Heart rate improving to the 110s.  Work of breathing and tachypnea has improved as well.  ICU admission discussed with Dr. Clearence Ped. Final Clinical Impression(s) / ED Diagnoses Final diagnoses:  Sepsis with acute hypoxic respiratory failure without septic shock, due to unspecified organism [facility])    Rx / [place] Orders ED Discharge Orders    None       Kendy Haston, Annie Main, MD 08/05/20 [phone]

## 2020-08-05 NOTE — Consult Note (Signed)
Consultation Note Date: 08/05/2020   Patient Name: [name]  DOB: Dec 02, 1923  MRN: [id number]  Age / Sex: 87 y.o., male  PCP: [name] Chandler, NP Referring Physician: Murlean Iba, MD  Reason for Consultation: Establishing goals of care and Psychosocial/spiritual support  HPI/Patient Profile: 87 y.o. male  with past medical history of prostate cancer, PAD, DM, atrial fibrillation, amputation of toe on left foot, non-Hodgkin's lymphoma, chronic non-healing wound to right great toe, dementia  admitted on 08/05/2020 with dry gangrene.   Clinical Assessment and Goals of Care: I have reviewed medical records including EPIC notes, labs and imaging, received report from bedside nursing staff, examined the patient and met at bedside with daughter, [name] to discuss diagnosis prognosis, [name], EOL wishes, disposition and options.  I introduced Palliative Medicine as specialized medical care for people living with serious illness. It focuses on providing relief from the symptoms and stress of a serious illness.  The Prill family is active with hospice of [facility] outpatient palliative services  As far as functional and nutritional status, Mr. [name] has had issues with esophageal diverticula and stricture.  He has been followed by at home speech therapy through advanced home care.  We discussed current illness and what it means in the larger context of on-going co-morbidities.  We talked about [name] acute illnesses and the treatment plan including, but not limited to, pneumonia and the treatment plan, selected labs, GI consult, speech therapy recommendations, time for outcomes.  I attempted to elicit values and goals of care important to the patient.  [name] states that family does not want to put [name] through a lot of testing or invasive procedures that he does not want, but at this  point his desire is for "more time".  Advanced directives, concepts specific to code status, artifical feeding and hydration, and rehospitalization were not discussed, [name] is already DNR.  Hospice and Palliative Care services outpatient were discussed.  [name] is active with palliative services with hospice of [facility].  Questions and concerns were addressed.  The family was encouraged to call with questions or concerns.   Conference with attending, bedside nursing staff related to patient condition, needs, goals of care. PMT to continue to follow.  HCPOA   NEXT OF KIN -[name] makes his own choices.  He is supported by his wife and daughters.    SUMMARY OF RECOMMENDATIONS   At this point continue to treat the treatable but no CPR or intubation. No amputation. Agreeable to EGD  Code Status/Advance Care Planning:  DNR  Symptom Management:   Per hospitalist, no additional needs at this time.  Palliative Prophylaxis:   Frequent Pain Assessment, Palliative Wound Care and Turn Reposition  Additional Recommendations (Limitations, Scope, Preferences):  At this point continue to treat the treatable but no CPR or intubation.  Psycho-social/Spiritual:   Desire for further Chaplaincy support:no  Additional Recommendations: Caregiving  Support/Resources and Education on Hospice  Prognosis:   Unable to determine, based on  outcomes.  3 to 6 months or less would not be surprising based on poor functional status, chronic illness burden.  Discharge Planning: To be determined, based on outcomes.      Primary Diagnoses: Present on Admission: . Sepsis ([place]) . ATRIAL FIBRILLATION   I have reviewed the medical record, interviewed the patient and family, and examined the patient. The following aspects are pertinent.  Past Medical History:  Diagnosis Date  . Acute bronchitis   . Acute lower UTI 11/25/2017  . Amputation of toe of left foot ([place]) 07/30/2017,  08/27/2017    Great toe 07/30/2017 Second toe 08/27/2017   . Arthritis   . Aspiration pneumonia ([place]) 01/31/2018  . Atrial fibrillation ([place])    Dr. Harl Bowie- [place] follows saw 11'14  . Bladder stones    tx. with oral meds and antibiotics, now surgery planned  . Bone metastases ([place]) 08/21/2015  . Cataracts, both eyes    surgery planned May 2015  . Community acquired pneumonia 04/16/2018  . Cystoid macular edema 04/21/2015    Right eye 04/21/2015   . Dehydration 04/16/2018  . Dementia without behavioral disturbance ([place]) 04/16/2018  . Diabetes mellitus without complication ([place])    Type II  . Diarrhea 11/25/2017  . Dyspnea    with activity  . Dysrhythmia    Afib  . Family history of breast cancer   . Family history of colon cancer   . Genetic testing 02/09/2017  . GERD (gastroesophageal reflux disease)   . 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 Margot Chimes, thus far without recurrence.   Marland Kitchen History of MRI of lumbar spine 03/05/2014  . Hypertension   . Hypertrophy of prostate with urinary obstruction   . Hypomagnesemia 03/16/2017  . Metabolic encephalopathy 44/06/4740  . Metastatic adenocarcinoma to prostate ([place]) 09/08/2013  . 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 NHL (non-Hodgkin's lymphoma) ([place]) 2005   Diffuse large B-cell lymphoma, clinically stage IIIA, CD20 positive, status post cervical lymph node biopsy 08/03/2003 on the left. PET scan was also positive in the spleen and small bowel region, but bone marrow aspiration and biopsy were negative. So he essentially had stage IIIAs. He received R-CHOP x6 cycles with CR established by PET scan criteria on 11/23/2003 with no evidence for relapse th  . Numbness 02/19/2017   Per [place]  new patient packet  . Osteomyelitis ([place]) 06/29/2017   toe of left foot   . PAD (peripheral artery disease) ([place]) 08/27/2017  . Polyneuropathy 02/19/2017  . Postural dizziness with presyncope 02/19/2017  . S/P TURP 08/10/2013  . Sepsis ([place]) 07/02/2018  . Skin cancer    Basal cell- face,head, neck,  arms, legs, Back  . Urinary frequency 02/19/2017   Social History   Socioeconomic History  . Marital status: Married    Spouse name: Not on file  . Number of children: Not on file  . Years of education: Not on file  . Highest education level: Not on file  Occupational History  . Occupation: retired    Fish farm manager: RETIRED    Comment: salesman  Tobacco Use  . Smoking status: Never Smoker  . Smokeless tobacco: Never Used  Vaping Use  . Vaping Use: Never used  Substance and Sexual Activity  . Alcohol use: No  . Drug  use: No  . Sexual activity: Not Currently  Other Topics Concern  . Not on file  Social History Narrative   Lives home with wife but family and home health care([place] and [place])                  Social Determinants of Health   Financial Resource Strain: Not on file  Food Insecurity: Not on file  Transportation Needs: No Transportation Needs  . Lack of Transportation (Medical): No  . Lack of Transportation (Non-Medical): No  Physical Activity: Not on file  Stress: Not on file  Social Connections: Not on file   Family History  Problem Relation Age of Onset  . Heart attack Mother        died in her 58s  . Other Father 52       pneumonia - died  . Breast cancer Sister        dx in her 28s-60s  . Colon cancer Brother        dx in his 20s  . Cancer Sister        TAH/BSO due to "male cancer"  . Breast cancer Daughter 5  . Breast cancer Daughter 60       DCIS - ATM VUS on [employer]ae 83 gene panel in 2018  . Breast cancer Daughter 86       DCIS - Negative on [employer] MyRisk 25 gene apnel in 2015  . Thyroid cancer Daughter 38        Medullary and Papillary  . Thyroid nodules Grandchild    Scheduled Meds: . apixaban  5 mg Oral BID  . aspirin  81 mg Oral Daily  . Chlorhexidine Gluconate Cloth  6 each Topical Daily  . dextromethorphan-guaiFENesin  1 tablet Oral Daily  . diltiazem  30 mg Oral Q6H  . donepezil  10 mg Oral Daily  . hydrocortisone sod succinate (SOLU-CORTEF) inj  50 mg Intravenous Q6H  . insulin aspart  0-5 Units Subcutaneous QHS  . insulin aspart  0-9 Units Subcutaneous TID WC  . mouth rinse  15 mL Mouth Rinse BID  . mirtazapine  15 mg Oral QHS  . pantoprazole sodium  40 mg Per Tube Daily   Continuous Infusions: . ceFEPime (MAXIPIME) IV 2 g (08/05/20 1219)  . lactated ringers 150 mL/hr at 08/05/20 0434  . metronidazole 500 mg (08/05/20 1029)   PRN Meds:.acetaminophen **OR** acetaminophen, ondansetron **OR** ondansetron (ZOFRAN) IV Medications Prior to Admission:  Prior to Admission medications   Medication Sig Start Date End Date Taking? Authorizing Provider  acetaminophen (TYLENOL) 500 MG tablet Take 325-500 mg by mouth See admin instructions. 349m in AM and 5027mPRN QHS   Yes [provider]  albuterol (2.5 MG/3ML) 0.083% NEBU 3 mL, albuterol (5 MG/ML) 0.5% NEBU 0.5 mL Inhale 2.5 mg into the lungs 3 (three) times daily as needed (shortness of breath).   Yes [provider]  apixaban (ELIQUIS) 5 MG TABS tablet Take 1 tablet (5 mg total) by mouth 2 (two) times daily. 04/15/20  Yes Branch, Alphonse GuildMD  Aspirin 81 MG CAPS Take 81 mg by mouth daily.    Yes [provider]  Cholecalciferol (VITAMIN D3) 1000 units CAPS Take 1,000 Units by mouth daily.    Yes [provider]  denosumab (XGEVA) 120 MG/1.7ML SOLN injection Inject 120 mg into the skin every 3 (three) months.   Yes [provider]  diclofenac Sodium (VOLTAREN) 1 % GEL Apply  2 g topically as needed (pain).   Yes [provider]  diltiazem (CARDIZEM SR) 60 MG 12 hr capsule Take 1 capsule  (60 mg total) by mouth 2 (two) times daily. 05/14/20  Yes Imogene Burn, PA-C  donepezil (ARICEPT) 10 MG tablet Take 1 tablet by mouth once daily Patient taking differently: Take 10 mg by mouth at bedtime. 06/11/20  Yes [name] Chandler, NP  doxycycline (VIBRA-TABS) 100 MG tablet Take 1 tablet (100 mg total) by mouth 2 (two) times daily for 14 days. 08/01/20 08/15/20 Yes Medina-Vargas, Monina C, NP  furosemide (LASIX) 20 MG tablet Take 0.5 tablets (10 mg total) by mouth daily. Take for 5 day, then change to as needed for shortness of breath for pleural effusion. Patient taking differently: Take 10 mg by mouth daily as needed for fluid or edema. 04/16/20  Yes Ngetich, Dinah C, NP  glipiZIDE (GLUCOTROL XL) 5 MG 24 hr tablet Take 1 tablet (5 mg total) by mouth daily with breakfast. 07/29/20  Yes [name] Chandler, NP  lisinopril (ZESTRIL) 20 MG tablet Take 1 tablet by mouth once daily Patient taking differently: Take 20 mg by mouth daily. 07/08/20  Yes Arnoldo Lenis, MD  metFORMIN (GLUCOPHAGE) 850 MG tablet Take 1 tablet (850 mg total) by mouth 2 (two) times daily with a meal. 05/31/20  Yes [name] Chandler, NP  mirtazapine (REMERON) 15 MG tablet Take 15 mg by mouth at bedtime.   Yes [provider]  omeprazole (PRILOSEC) 40 MG capsule Take 1 capsule (40 mg total) by mouth daily. 06/18/20  Yes [name] Chandler, NP  Probiotic Product (PROBIOTIC DAILY PO) Take 1 tablet by mouth daily. Every morning    Yes [provider]  simethicone (MYLICON) 80 MG chewable tablet Chew 80 mg by mouth daily.   Yes [provider]  blood glucose meter kit and supplies Dispense based on patient and insurance preference. Use four times daily as directed. (FOR ICD-10 E10.9, E11.9). 03/30/19   Maryruth Hancock, MD  glucose blood (EASYMAX TEST) test strip Use as instructed 08/24/19   Corum, Rex Kras, MD  Lancets 28G MISC 1 kit by Does not apply route every morning. 07/18/19   Maryruth Hancock, MD    Allergies  Allergen Reactions  . Tape Other (See Comments)    SKIN IS VERY THIN AND WILL TEAR AND BRUISE VERY EASILY!!!!  . Augmentin [Amoxicillin-Pot Clavulanate] Rash and Other (See Comments)    Redness of skin   Review of Systems  Unable to perform ROS: Age    Physical Exam Vitals and nursing note reviewed.  Constitutional:      General: He is not in acute distress.    Appearance: He is ill-appearing.  HENT:     Mouth/Throat:     Mouth: Mucous membranes are moist.  Cardiovascular:     Rate and Rhythm: Normal rate.  Pulmonary:     Effort: Pulmonary effort is normal. No respiratory distress.  Skin:    General: Skin is warm and dry.     Coloration: Skin is pale.     Comments: Wounds as noted in chart  Neurological:     Mental Status: He is alert.     Comments: Orientation questions not asked     Vital Signs: BP (!) 80/41   Pulse 96   Temp (!) 97.3 F (36.3 C) (Axillary)   Resp 19   Ht _0  (1.778 m)   Wt 58.5 kg  SpO2 100%   BMI 18.51 kg/m  Pain Scale: 0-10   Pain Score: 0-No pain   SpO2: SpO2: 100 % O2 Device:SpO2: 100 % O2 Flow Rate: .   IO: Intake/output summary: No intake or output data in the 24 hours ending 08/05/20 1547  LBM: Last BM Date: 08/04/20 Baseline Weight: Weight: 60.8 kg Most recent weight: Weight: 58.5 kg     Palliative Assessment/Data:   Flowsheet Rows   Flowsheet Row Most Recent Value  Intake Tab   Referral Department Hospitalist  Unit at Time of Referral Intermediate Care Unit  Palliative Care Primary Diagnosis Sepsis/Infectious Disease  Date Notified 08/05/20  Palliative Care Type Return patient Palliative Care  Reason for referral Clarify Goals of Care  Date of Admission 08/05/20  Date first seen by Palliative Care 08/05/20  # of days Palliative referral response time 0 Day(s)  # of days IP prior to Palliative referral 0  Clinical Assessment   Palliative Performance Scale Score 50%  Pain Max last 24 hours Not  able to report  Pain Min Last 24 hours Not able to report  Dyspnea Max Last 24 Hours Not able to report  Dyspnea Min Last 24 hours Not able to report  Psychosocial & Spiritual Assessment   Palliative Care Outcomes       Time In: 1510 Time Out: 1620 Time Total: 70 minutes  Greater than 50%  of this time was spent counseling and coordinating care related to the above assessment and plan.  Signed by: Drue Novel, NP   Please contact Palliative Medicine Team phone at [phone] for questions and concerns.  For individual provider: See Shea Evans

## 2020-08-05 NOTE — Progress Notes (Signed)
ASSUMPTION OF CARE NOTE   08/05/2020 4:17 PM  [name] was seen and examined.  The H&P by the admitting provider, orders, imaging was reviewed.  Please see new orders.  Will continue to follow.  Pt appears terminally ill. I have requested a palliative care consultation for goals of care discussions.  He is DNR present on admission.  With his severe PVD starting pressors would not be ideal given he already has gangrene and had toe amputations in feet.  He remains confused and agitated.  I believe he would greatly benefit from comfort care treatments.  Unfortunately acute care cannot offer any meaningful improvement for him especially given that he is aspirating everything, has esophageal stenosis and has dementia.   I asked for palliative team to focus on this patient as a quality of life priority.   Vitals:   08/05/20 1000 08/05/20 1125  BP: (!) 80/41   Pulse:    Resp: 13 19  Temp:  (!) 97.3 F (36.3 C)  SpO2:      Results for orders placed or performed during the hospital encounter of 08/05/20  Resp Panel by RT-PCR (Flu A&B, Covid) Nasopharyngeal Swab   Specimen: Nasopharyngeal Swab; Nasopharyngeal(NP) swabs in vial transport medium  Result Value Ref Range   SARS Coronavirus 2 by RT PCR NEGATIVE NEGATIVE   Influenza A by PCR NEGATIVE NEGATIVE   Influenza B by PCR NEGATIVE NEGATIVE  Blood Culture (routine x 2)   Specimen: BLOOD RIGHT FOREARM  Result Value Ref Range   Specimen Description BLOOD RIGHT FOREARM    Special Requests      BOTTLES DRAWN AEROBIC AND ANAEROBIC Blood Culture adequate volume   Culture      NO GROWTH < 12 HOURS Performed at [facility], [address], [place], [place] [address]    Report Status PENDING   Blood Culture (routine x 2)   Specimen: BLOOD LEFT FOREARM  Result Value Ref Range   Specimen Description BLOOD LEFT FOREARM    Special Requests      BOTTLES DRAWN AEROBIC AND ANAEROBIC Blood Culture adequate volume   Culture      NO GROWTH < 12  HOURS Performed at [facility], [address] [place], [place] [address]    Report Status PENDING   MRSA PCR Screening   Specimen: Nasal Mucosa; Nasopharyngeal  Result Value Ref Range   MRSA by PCR NEGATIVE NEGATIVE  Lactic acid, plasma  Result Value Ref Range   Lactic Acid, Venous 1.5 0.5 - 1.9 mmol/L  Lactic acid, plasma  Result Value Ref Range   Lactic Acid, Venous 1.2 0.5 - 1.9 mmol/L  Comprehensive metabolic panel  Result Value Ref Range   Sodium 138 135 - 145 mmol/L   Potassium 4.0 3.5 - 5.1 mmol/L   Chloride 112 (H) 98 - 111 mmol/L   CO2 15 (L) 22 - 32 mmol/L   Glucose, Bld 188 (H) 70 - 99 mg/dL   BUN 27 (H) 8 - 23 mg/dL   Creatinine, Ser 1.13 0.61 - 1.24 mg/dL   Calcium 8.3 (L) 8.9 - 10.3 mg/dL   Total Protein 6.8 6.5 - 8.1 g/dL   Albumin 2.9 (L) 3.5 - 5.0 g/dL   AST 11 (L) 15 - 41 U/L   ALT 9 0 - 44 U/L   Alkaline Phosphatase 81 38 - 126 U/L   Total Bilirubin 0.6 0.3 - 1.2 mg/dL   GFR, Estimated >60 >60 mL/min   Anion gap 11 5 -  15  CBC WITH DIFFERENTIAL  Result Value Ref Range   WBC 19.3 (H) 4.0 - 10.5 K/uL   RBC 3.02 (L) 4.22 - 5.81 MIL/uL   Hemoglobin 8.5 (L) 13.0 - 17.0 g/dL   HCT 25.5 (L) 39.0 - 52.0 %   MCV 84.4 80.0 - 100.0 fL   MCH 28.1 26.0 - 34.0 pg   MCHC 33.3 30.0 - 36.0 g/dL   RDW 13.5 11.5 - 15.5 %   Platelets 378 150 - 400 K/uL   nRBC 0.0 0.0 - 0.2 %   Neutrophils Relative % 79 %   Neutro Abs 15.2 (H) 1.7 - 7.7 K/uL   Lymphocytes Relative 5 %   Lymphs Abs 1.0 0.7 - 4.0 K/uL   Monocytes Relative 11 %   Monocytes Absolute 2.2 (H) 0.1 - 1.0 K/uL   Eosinophils Relative 1 %   Eosinophils Absolute 0.1 0.0 - 0.5 K/uL   Basophils Relative 0 %   Basophils Absolute 0.0 0.0 - 0.1 K/uL   Immature Granulocytes 4 %   Abs Immature Granulocytes 0.70 (H) 0.00 - 0.07 K/uL  Protime-INR  Result Value Ref Range   Prothrombin Time 23.5 (H) 11.4 - 15.2 seconds   INR 2.1 (H) 0.8 - 1.2  APTT  Result Value Ref Range   aPTT 41 (H) 24 - 36 seconds  Blood  gas, venous  Result Value Ref Range   FIO2 21.00    pH, Ven 7.349 7.250 - 7.430   pCO2, Ven 32.0 (L) 44.0 - 60.0 mmHg   pO2, Ven 45.1 (H) 32.0 - 45.0 mmHg   Bicarbonate 18.2 (L) 20.0 - 28.0 mmol/L   Acid-base deficit 7.4 (H) 0.0 - 2.0 mmol/L   O2 Saturation 76.0 %   Patient temperature 37.8   Protime-INR  Result Value Ref Range   Prothrombin Time 23.5 (H) 11.4 - 15.2 seconds   INR 2.1 (H) 0.8 - 1.2  Cortisol-am, blood  Result Value Ref Range   Cortisol - AM >100.0 (H) 6.7 - 22.6 ug/dL  Procalcitonin  Result Value Ref Range   Procalcitonin 1.80 ng/mL  Comprehensive metabolic panel  Result Value Ref Range   Sodium 137 135 - 145 mmol/L   Potassium 4.1 3.5 - 5.1 mmol/L   Chloride 113 (H) 98 - 111 mmol/L   CO2 17 (L) 22 - 32 mmol/L   Glucose, Bld 164 (H) 70 - 99 mg/dL   BUN 25 (H) 8 - 23 mg/dL   Creatinine, Ser 1.00 0.61 - 1.24 mg/dL   Calcium 7.6 (L) 8.9 - 10.3 mg/dL   Total Protein 5.6 (L) 6.5 - 8.1 g/dL   Albumin 2.4 (L) 3.5 - 5.0 g/dL   AST 12 (L) 15 - 41 U/L   ALT 8 0 - 44 U/L   Alkaline Phosphatase 64 38 - 126 U/L   Total Bilirubin 0.5 0.3 - 1.2 mg/dL   GFR, Estimated >60 >60 mL/min   Anion gap 7 5 - 15  Magnesium  Result Value Ref Range   Magnesium 0.5 (LL) 1.7 - 2.4 mg/dL  CBC WITH DIFFERENTIAL  Result Value Ref Range   WBC 20.6 (H) 4.0 - 10.5 K/uL   RBC 2.62 (L) 4.22 - 5.81 MIL/uL   Hemoglobin 7.3 (L) 13.0 - 17.0 g/dL   HCT 22.4 (L) 39.0 - 52.0 %   MCV 85.5 80.0 - 100.0 fL   MCH 27.9 26.0 - 34.0 pg   MCHC 32.6 30.0 - 36.0 g/dL   RDW 13.9 11.5 -  15.5 %   Platelets 261 150 - 400 K/uL   nRBC 0.0 0.0 - 0.2 %   Neutrophils Relative % 79 %   Neutro Abs 16.2 (H) 1.7 - 7.7 K/uL   Lymphocytes Relative 6 %   Lymphs Abs 1.3 0.7 - 4.0 K/uL   Monocytes Relative 13 %   Monocytes Absolute 2.7 (H) 0.1 - 1.0 K/uL   Eosinophils Relative 0 %   Eosinophils Absolute 0.1 0.0 - 0.5 K/uL   Basophils Relative 0 %   Basophils Absolute 0.0 0.0 - 0.1 K/uL   Immature  Granulocytes 2 %   Abs Immature Granulocytes 0.35 (H) 0.00 - 0.07 K/uL  Troponin I (High Sensitivity)  Result Value Ref Range   Troponin I (High Sensitivity) 29 (H) <18 ng/L  Troponin I (High Sensitivity)  Result Value Ref Range   Troponin I (High Sensitivity) 32 (H) <18 ng/L   *Note: Due to a large number of results and/or encounters for the requested time period, some results have not been displayed. A complete set of results can be found in Results Review.   Murvin Natal, MD [facility]   08/05/2020 12:05 AM How to contact the [facility] Attending or Consulting provider [place] or covering provider during after hours [place], for this patient?  1. Check the care team in [facility] and look for a) attending/consulting [facility] provider listed and b) the [facility] team listed 2. Log into [email or web address] and use [place]'s universal password to access. If you do not have the password, please contact the hospital operator. 3. Locate the [facility] provider you are looking for under [facility] and page to a number that you can be directly reached. 4. If you still have difficulty reaching the provider, please page the [facility] (Director on Call) for the Hospitalists listed on amion for assistance.

## 2020-08-05 NOTE — H&P (Signed)
TRH H&P    Patient Demographics:    [name], is a 87 y.o. male  MRN: [id number]  DOB - 11-Jul-1923  Admit Date - 08/05/2020  Referring MD/NP/PA: Rancour  Outpatient Primary MD for the patient is [name], NP  Patient coming from: Home  Chief complaint- cough, fever   HPI:    [name]  is a 87 y.o. male, with history of peripheral artery disease, metastatic cancer, GERD, dementia, atrial fibrillation, diabetes mellitus type 2, and more presents the ED with a chief complaint of cough and fever.  Patient is confused and not able to provide any history at all.  Daughter at bedside reports that patient has not been feeling well for the past 2 days.  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  They have been treating him since November and expecting autoamputation.  They had followed with vascular surgery who reported that his limited circulation would only be able to support an AKA, no lower amputations on that extremity, which patient did not want to proceed with.  In November he was started on doxycycline, became septic and was changed to clindamycin.  The toe was doing pretty well, but then the area became erythematous and edematous at the end of March.  He was started on clindamycin at that time, and then switched to Doxy 3 days ago.  Daughter reports the patient was not feeling well but does not go into a great deal of explanation.  She is reports has had progressive generalized weakness over the last couple of days.  He has had trouble with dysphagia and has home speech therapy, but today he aspirated when eating canned mandrin oranges.  He had a cough throughout the day that became worse.  Patient then became very chilled with rigors.  Daughter at bedside reports that temperature was as high as 101 at home.  He was confused more so  than at his baseline.  He said he had to go to the bathroom and 3 women tried to help him transfer from his chair to the walker and they were unable to do it so they knew that he was more than they can take care of at home that he needed to come into the ED.  Of note he was recently diagnosed on March 16 with an F esophageal diverticulum.  He is scheduled for a endoscopy May 6 for esophageal stretching.  On his previous swallow study he failed at all stages of swallowing per daughter's report.  Patient is vaccinated for COVID.  He is DNR and they do not want any invasive measures including no central line however they would be okay with pressors if needed.  In the ED Temp 103.7, heart rate 92-1 20, respiratory rate 27-38, blood pressure 93/65-122/99 maintaining oxygen sats on 3 L nasal cannula White blood cell count 19.3, hemoglobin 8.5 Chemistry panel reveals a BUN to creatinine ratio of 27:1 0.13 -Baseline creatinine seems to be 0.9  Hyperglycemic at 188 COVID test pending Blood culture pending Chest x-ray shows lower lobe airspace opacity concerning for pneumonia X-ray right foot shows no acute bony abnormality EKG shows a heart rate of 131 A. fib with RVR QTC 489 Patient was started on cefepime, bank, Flagyl Lactic acid and Trope pending Tylenol and 2 L fluid given in the ED Continuing fluid at 150 mL/h    Review of systems:    Cannot obtain review of systems secondary to patient's altered mental status   Past History of the following :    Past Medical History:  Diagnosis Date  . Acute bronchitis   . Acute lower UTI 11/25/2017  . Amputation of toe of left foot ([place]) 07/30/2017, 08/27/2017    Great toe 07/30/2017 Second toe 08/27/2017   . Arthritis   . Aspiration pneumonia ([place]) 01/31/2018  . Atrial fibrillation ([place])    Dr. Harl Bowie- [facility] follows saw 11'14  . Bladder stones    tx. with oral meds and antibiotics, now surgery planned  . Bone metastases ([place]) 08/21/2015  .  Cataracts, both eyes    surgery planned May 2015  . Community acquired pneumonia 04/16/2018  . Cystoid macular edema 04/21/2015    Right eye 04/21/2015   . Dehydration 04/16/2018  . Dementia without behavioral disturbance ([place]) 04/16/2018  . Diabetes mellitus without complication ([place])    Type II  . Diarrhea 11/25/2017  . Dyspnea    with activity  . Dysrhythmia    Afib  . Family history of breast cancer   . Family history of colon cancer   . Genetic testing 02/09/2017  . GERD (gastroesophageal reflux disease)   . 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 Margot Chimes, thus far without recurrence.   Marland Kitchen History of MRI of lumbar spine 03/05/2014  . Hypertension   . Hypertrophy of prostate with urinary obstruction   . Hypomagnesemia 03/16/2017  . Metabolic encephalopathy 54/62/7035  . Metastatic adenocarcinoma to prostate ([place]) 09/08/2013  . 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 NHL (non-Hodgkin's lymphoma) ([place]) 2005   Diffuse large B-cell lymphoma, clinically stage IIIA, CD20 positive, status post cervical lymph node biopsy 08/03/2003 on the left. PET scan was also positive in the spleen and small bowel region, but bone marrow aspiration and biopsy were negative. So he essentially had stage IIIAs. He received R-CHOP x6 cycles with CR established by PET scan criteria on 11/23/2003 with no evidence for relapse th  . Numbness 02/19/2017   Per [facility] new patient packet  . Osteomyelitis ([place]) 06/29/2017   toe of left foot   . PAD (peripheral artery disease) ([place]) 08/27/2017  . Polyneuropathy 02/19/2017  . Postural dizziness with presyncope 02/19/2017  . S/P TURP 08/10/2013  . Sepsis ([place]) 07/02/2018  . Skin cancer    Basal cell- face,head, neck,   arms, legs, Back  . Urinary frequency 02/19/2017      Past Surgical History:  Procedure Laterality Date  . ABDOMINAL AORTOGRAM W/LOWER EXTREMITY N/A 09/20/2019   Procedure: ABDOMINAL AORTOGRAM W/LOWER EXTREMITY;  Surgeon: Marty Heck, MD;  Location: [facility];  Service: Cardiovascular;  Laterality: N/A;  . AMPUTATION Left 06/29/2017   Procedure: AMPUTATION LEFT GREAT TOE;  Surgeon: [name], MD;  Location: [facility];  Service: Vascular;  Laterality: Left;  . AMPUTATION Left 08/27/2017   Procedure: AMPUTATION Left SECOND TOE;  Surgeon: [name], MD;  Location: [facility];  Service: Vascular;  Laterality: Left;  . CATARACT EXTRACTION Bilateral 2015  . CHOLECYSTECTOMY    . COLON RESECTION     small bowel  . CYSTOSCOPY WITH LITHOLAPAXY N/A 08/10/2013   Procedure: CYSTOSCOPY WITH LITHOLAPAXY WITH [name];  Surgeon: [name], MD;  [facility];  Service: Urology;  Laterality: N/A;  . ESOPHAGEAL DILATION N/A 02/27/2016   Procedure: ESOPHAGEAL DILATION;  Surgeon: [name], MD;  [facility];  Service: Endoscopy;  Laterality: N/A;  . ESOPHAGEAL DILATION N/A 07/07/2018   Procedure: ESOPHAGEAL DILATION;  Surgeon: [name], MD;  [facility];  Service: Endoscopy;  Laterality: N/A;  . ESOPHAGOGASTRODUODENOSCOPY N/A 02/27/2016   Procedure: ESOPHAGOGASTRODUODENOSCOPY (EGD);  Surgeon: [name], MD;  [facility];  Service: Endoscopy;  Laterality: N/A;  12:00  . ESOPHAGOGASTRODUODENOSCOPY N/A 07/07/2018   Procedure: ESOPHAGOGASTRODUODENOSCOPY (EGD);  Surgeon: [name], MD;  [facility];  Service: Endoscopy;  Laterality: N/A;  . INCISION AND DRAINAGE PERIRECTAL ABSCESS    . LOWER EXTREMITY ANGIOGRAPHY N/A 06/25/2017   Procedure: LOWER EXTREMITY ANGIOGRAPHY;  Surgeon: [name], MD;  Location: [facility];  Service: Cardiovascular;  Laterality: N/A;  . LYMPH NODE DISSECTION Left     '05-neck  . PERIPHERAL VASCULAR ATHERECTOMY Right 09/20/2019   Procedure: PERIPHERAL VASCULAR ATHERECTOMY;  Surgeon: Marty Heck, MD;  Location: [facility];  Service: Cardiovascular;  Laterality: Right;  Posterior tibial  . PORT-A-CATH REMOVAL    . PORTACATH PLACEMENT     insertion and removal -last chemotherapy 10 yrs ago  . TRANSURETHRAL RESECTION OF PROSTATE N/A 08/10/2013   Procedure: TRANSURETHRAL RESECTION OF THE PROSTATE WITH GYRUS INSTRUMENTS;  Surgeon: [name], MD;  [facility];  Service: Urology;  Laterality: N/A;      Social History:      Social History   Tobacco Use  . Smoking status: Never Smoker  . Smokeless tobacco: Never Used  Substance Use Topics  . Alcohol use: No       Family History :     Family History  Problem Relation Age of Onset  . Heart attack Mother        died in her 79s  . Other Father 42       pneumonia - died  . Breast cancer Sister        dx in her 22s-60s  . Colon cancer Brother        dx in his 49s  . Cancer Sister        TAH/BSO due to "male cancer"  . Breast cancer Daughter 38  . Breast cancer Daughter 87       DCIS - ATM VUS on [employer]ae 83 gene panel in 2018  . Breast cancer Daughter 60       DCIS - Negative on [employer] MyRisk 25 gene apnel in 2015  . Thyroid cancer Daughter 65       Medullary and Papillary  . Thyroid nodules Grandchild       Home Medications:   Prior to Admission medications   Medication Sig Start Date End Date Taking? Authorizing Provider  acetaminophen (TYLENOL) 500 MG tablet Take 500 mg by mouth at bedtime.    [provider]  albuterol (2.5 MG/3ML) 0.083% NEBU 3 mL, albuterol (5 MG/ML) 0.5%  NEBU 0.5 mL Inhale into the lungs 3 (three) times daily as needed.    [provider]  apixaban (ELIQUIS) 5 MG TABS tablet Take 1 tablet (5 mg total) by mouth 2 (two) times daily. 04/15/20   Arnoldo Lenis, MD  Aspirin 81 MG CAPS Take 81 mg by mouth daily.      [provider]  blood glucose meter kit and supplies Dispense based on patient and insurance preference. Use four times daily as directed. (FOR ICD-10 E10.9, E11.9). 03/30/19   Corum, Rex Kras, MD  cephALEXin (KEFLEX) 250 MG capsule Take 250 mg by mouth at bedtime. Preventative for UTI prescribed by urology    [provider]  Cholecalciferol (VITAMIN D3) 1000 units CAPS Take 1,000 Units by mouth daily.     [provider]  Cranberry 500 MG TABS Take 500 mg by mouth daily. Patient not taking: Reported on 08/01/2020    [provider]  denosumab (XGEVA) 120 MG/1.7ML SOLN injection Inject 120 mg into the skin every 3 (three) months.    [provider]  Dextromethorphan-guaiFENesin [facility] DM PO) Take by mouth daily.    [provider]  diclofenac Sodium (VOLTAREN) 1 % GEL Apply 2 g topically as needed.    [provider]  diltiazem (CARDIZEM SR) 60 MG 12 hr capsule Take 1 capsule (60 mg total) by mouth 2 (two) times daily. 05/14/20   Imogene Burn, PA-C  donepezil (ARICEPT) 10 MG tablet Take 1 tablet by mouth once daily 06/11/20   [name], NP  doxycycline (VIBRA-TABS) 100 MG tablet Take 1 tablet (100 mg total) by mouth 2 (two) times daily for 14 days. 08/01/20 08/15/20  Medina-Vargas, Monina C, NP  furosemide (LASIX) 20 MG tablet Take 0.5 tablets (10 mg total) by mouth daily. Take for 5 day, then change to as needed for shortness of breath for pleural effusion. 04/16/20   Ngetich, Dinah C, NP  glipiZIDE (GLUCOTROL XL) 5 MG 24 hr tablet Take 1 tablet (5 mg total) by mouth daily with breakfast. 07/29/20   [name], NP  glucose blood (EASYMAX TEST) test strip Use as instructed 08/24/19   Maryruth Hancock, MD  Lancets 28G MISC 1 kit by Does not apply route every morning. 07/18/19   Maryruth Hancock, MD  lisinopril (ZESTRIL) 20 MG tablet Take 1 tablet by mouth once daily 07/08/20   Arnoldo Lenis, MD  metFORMIN (GLUCOPHAGE) 850 MG  tablet Take 1 tablet (850 mg total) by mouth 2 (two) times daily with a meal. 05/31/20   [name], NP  mirtazapine (REMERON) 15 MG tablet Take 15 mg by mouth at bedtime.    [provider]  Multiple Vitamin (MULTIVITAMIN WITH MINERALS) TABS tablet Take 1 tablet by mouth daily. Patient not taking: Reported on 08/01/2020    [provider]  omeprazole (PRILOSEC) 40 MG capsule Take 1 capsule (40 mg total) by mouth daily. 06/18/20   [name], NP  Probiotic Product (PROBIOTIC DAILY PO) Take 1 tablet by mouth daily. Every morning     [provider]  simethicone (MYLICON) 80 MG chewable tablet Chew 80 mg by mouth daily.    [provider]     Allergies:     Allergies  Allergen Reactions  . Tape Other (See Comments)    SKIN IS VERY THIN AND WILL TEAR AND BRUISE VERY EASILY!!!!  . Augmentin [Amoxicillin-Pot Clavulanate] Rash and Other (See Comments)  Redness of skin     Physical Exam:   Vitals  Blood pressure 120/88, pulse (!) 111, temperature (!) 100.9 F (38.3 C), temperature source Rectal, resp. rate (!) 21, height _0  (1.778 m), weight 60.8 kg, SpO2 98 %.  1.  General: Patient lying supine in bed, no acute distress  2. Psychiatric: Patient is confused, drowsy, cooperative with exam  3. Neurologic: This is symmetric, speech and language are normal, patient opens eyes to voice, is confused, is not following commands at this time either secondary to inability to do so or difficulty understanding the command  4. HEENMT:  Head is atraumatic, normocephalic, pupils are reactive to light, neck is cachectic, trachea is midline, mucous membranes mildly dry  5. Respiratory : No wheezing, rhonchi, rales on exam, no clubbing, no cyanosis  6. Cardiovascular : Heart rate is tachycardic, rhythm is irregularly irregular, systolic murmur present, no rubs or gallops, no peripheral edema  7. Gastrointestinal:  Abdomen is soft,  nondistended, nontender to palpation, bowel sounds active  8. Skin:  Multiple actinic keratoses, multiple nevi, dry gangrene of right great toe with erythema and edema at base of toe, no drainage, skin is warm dry and intact otherwise  9.Musculoskeletal:  No calf tenderness, no acute deformity, left great toe amputation    Data Review:    CBC Recent Labs  Lab 08/05/20 0015  WBC 19.3*  HGB 8.5*  HCT 25.5*  PLT 378  MCV 84.4  MCH 28.1  MCHC 33.3  RDW 13.5  LYMPHSABS 1.0  MONOABS 2.2*  EOSABS 0.1  BASOSABS 0.0   ------------------------------------------------------------------------------------------------------------------  Results for orders placed or performed during the hospital encounter of 08/05/20 (from the past 48 hour(s))  Resp Panel by RT-PCR (Flu A&B, Covid) Nasopharyngeal Swab     Status: None   Collection Time: 08/05/20 12:15 AM   Specimen: Nasopharyngeal Swab; Nasopharyngeal(NP) swabs in vial transport medium  Result Value Ref Range   SARS Coronavirus 2 by RT PCR NEGATIVE NEGATIVE    Comment: (NOTE) SARS-CoV-2 target nucleic acids are NOT DETECTED.  The SARS-CoV-2 RNA is generally detectable in upper respiratory specimens during the acute phase of infection. The lowest concentration of SARS-CoV-2 viral copies this assay can detect is 138 copies/mL. A negative result does not preclude SARS-Cov-2 infection and should not be used as the sole basis for treatment or other patient management decisions. A negative result may occur with  improper specimen collection/handling, submission of specimen other than nasopharyngeal swab, presence of viral mutation(s) within the areas targeted by this assay, and inadequate number of viral copies(<138 copies/mL). A negative result must be combined with clinical observations, patient history, and epidemiological information. The expected result is Negative.  Fact Sheet for Patients:   EntrepreneurPulse.com.au  Fact Sheet for Healthcare Providers:  IncredibleEmployment.be  This test is no t yet approved or cleared by the [place] FDA and  has been authorized for detection and/or diagnosis of SARS-CoV-2 by FDA under an Emergency Use Authorization (EUA). This EUA will remain  in effect (meaning this test can be used) for the duration of the COVID-19 declaration under Section 564(b)(1) of the Act, 21 U.S.C.section 360bbb-3(b)(1), unless the authorization is terminated  or revoked sooner.       Influenza A by PCR NEGATIVE NEGATIVE   Influenza B by PCR NEGATIVE NEGATIVE    Comment: (NOTE) The Xpert Xpress SARS-CoV-2/FLU/RSV plus assay is intended as an aid in the diagnosis of influenza from Nasopharyngeal swab specimens and should not be  used as a sole basis for treatment. Nasal washings and aspirates are unacceptable for Xpert Xpress SARS-CoV-2/FLU/RSV testing.  Fact Sheet for Patients: EntrepreneurPulse.com.au  Fact Sheet for Healthcare Providers: IncredibleEmployment.be  This test is not yet approved or cleared by the [place] FDA and has been authorized for detection and/or diagnosis of SARS-CoV-2 by FDA under an Emergency Use Authorization (EUA). This EUA will remain in effect (meaning this test can be used) for the duration of the COVID-19 declaration under Section 564(b)(1) of the Act, 21 U.S.C. section 360bbb-3(b)(1), unless the authorization is terminated or revoked.  Performed at [facility], [address], [place], [place] [address]   Lactic acid, plasma     Status: None   Collection Time: 08/05/20 12:15 AM  Result Value Ref Range   Lactic Acid, Venous 1.5 0.5 - 1.9 mmol/L    Comment: Performed at [facility], [address] [place], [place] [address]  Comprehensive metabolic panel     Status: Abnormal   Collection Time: 08/05/20 12:15 AM  Result Value Ref Range    Sodium 138 135 - 145 mmol/L   Potassium 4.0 3.5 - 5.1 mmol/L   Chloride 112 (H) 98 - 111 mmol/L   CO2 15 (L) 22 - 32 mmol/L   Glucose, Bld 188 (H) 70 - 99 mg/dL    Comment: Glucose reference range applies only to samples taken after fasting for at least 8 hours.   BUN 27 (H) 8 - 23 mg/dL   Creatinine, Ser 1.13 0.61 - 1.24 mg/dL   Calcium 8.3 (L) 8.9 - 10.3 mg/dL   Total Protein 6.8 6.5 - 8.1 g/dL   Albumin 2.9 (L) 3.5 - 5.0 g/dL   AST 11 (L) 15 - 41 U/L   ALT 9 0 - 44 U/L   Alkaline Phosphatase 81 38 - 126 U/L   Total Bilirubin 0.6 0.3 - 1.2 mg/dL   GFR, Estimated >60 >60 mL/min    Comment: (NOTE) Calculated using the CKD-EPI Creatinine Equation (2021)    Anion gap 11 5 - 15    Comment: Performed at [facility], [address] [place], [place] [address]  CBC WITH DIFFERENTIAL     Status: Abnormal   Collection Time: 08/05/20 12:15 AM  Result Value Ref Range   WBC 19.3 (H) 4.0 - 10.5 K/uL   RBC 3.02 (L) 4.22 - 5.81 MIL/uL   Hemoglobin 8.5 (L) 13.0 - 17.0 g/dL   HCT 25.5 (L) 39.0 - 52.0 %   MCV 84.4 80.0 - 100.0 fL   MCH 28.1 26.0 - 34.0 pg   MCHC 33.3 30.0 - 36.0 g/dL   RDW 13.5 11.5 - 15.5 %   Platelets 378 150 - 400 K/uL   nRBC 0.0 0.0 - 0.2 %   Neutrophils Relative % 79 %   Neutro Abs 15.2 (H) 1.7 - 7.7 K/uL   Lymphocytes Relative 5 %   Lymphs Abs 1.0 0.7 - 4.0 K/uL   Monocytes Relative 11 %   Monocytes Absolute 2.2 (H) 0.1 - 1.0 K/uL   Eosinophils Relative 1 %   Eosinophils Absolute 0.1 0.0 - 0.5 K/uL   Basophils Relative 0 %   Basophils Absolute 0.0 0.0 - 0.1 K/uL   Immature Granulocytes 4 %   Abs Immature Granulocytes 0.70 (H) 0.00 - 0.07 K/uL    Comment: Performed at [facility], [address], [place], [place] [address]  Protime-INR     Status: Abnormal   Collection Time: 08/05/20 12:15 AM  Result Value Ref Range   Prothrombin Time 23.5 (H) 11.4 - 15.2 seconds   INR 2.1 (H) 0.8 - 1.2    Comment: (NOTE) INR goal varies based on device and disease  states. Performed at [facility], [address], [place], [place] [address]   APTT     Status: Abnormal   Collection Time: 08/05/20 12:15 AM  Result Value Ref Range   aPTT 41 (H) 24 - 36 seconds    Comment:        IF BASELINE aPTT IS ELEVATED, SUGGEST PATIENT RISK ASSESSMENT BE USED TO DETERMINE APPROPRIATE ANTICOAGULANT THERAPY. Performed at [facility], [address] [place],  [address]    *Note: Due to a large number of results and/or encounters for the requested time period, some results have not been displayed. A complete set of results can be found in Results Review.    Chemistries  Recent Labs  Lab 07/30/20 1436 08/05/20 0015  NA 140 138  K 4.6 4.0  CL 111 112*  CO2 17* 15*  GLUCOSE 108* 188*  BUN 33* 27*  CREATININE 1.20 1.13  CALCIUM 8.7* 8.3*  AST 13* 11*  ALT 9 9  ALKPHOS 77 81  BILITOT 0.3 0.6   ------------------------------------------------------------------------------------------------------------------  ------------------------------------------------------------------------------------------------------------------ GFR: Estimated Creatinine Clearance: 34.4 mL/min (by C-G formula based on SCr of 1.13 mg/dL). Liver Function Tests: Recent Labs  Lab 07/30/20 1436 08/05/20 0015  AST 13* 11*  ALT 9 9  ALKPHOS 77 81  BILITOT 0.3 0.6  PROT 7.0 6.8  ALBUMIN 3.0* 2.9*   No results for input(s): LIPASE, AMYLASE in the last 168 hours. No results for input(s): AMMONIA in the last 168 hours. Coagulation Profile: Recent Labs  Lab 08/05/20 0015  INR 2.1*   Cardiac Enzymes: No results for input(s): CKTOTAL, CKMB, CKMBINDEX, TROPONINI in the last 168 hours. BNP (last 3 results) No results for input(s): PROBNP in the last 8760 hours. HbA1C: No results for input(s): HGBA1C in the last 72 hours. CBG: No results for input(s): GLUCAP in the last 168 hours. Lipid Profile: No results for input(s): CHOL, HDL, LDLCALC, TRIG, CHOLHDL, LDLDIRECT  in the last 72 hours. Thyroid Function Tests: No results for input(s): TSH, T4TOTAL, FREET4, T3FREE, THYROIDAB in the last 72 hours. Anemia Panel: No results for input(s): VITAMINB12, FOLATE, FERRITIN, TIBC, IRON, RETICCTPCT in the last 72 hours.  --------------------------------------------------------------------------------------------------------------- Urine analysis:    Component Value Date/Time   COLORURINE YELLOW 09/22/2019 1525   APPEARANCEUR HAZY (A) 09/22/2019 1525   LABSPEC 1.035 (H) 09/22/2019 1525   PHURINE 5.0 09/22/2019 1525   GLUCOSEU NEGATIVE 09/22/2019 1525   HGBUR NEGATIVE 09/22/2019 1525   BILIRUBINUR negative 02/03/2020 1515   KETONESUR negative 02/03/2020 1515   KETONESUR NEGATIVE 09/22/2019 1525   PROTEINUR =30 (A) 02/03/2020 1515   PROTEINUR NEGATIVE 09/22/2019 1525   UROBILINOGEN 0.2 02/03/2020 1515   NITRITE Negative 02/03/2020 1515   NITRITE NEGATIVE 09/22/2019 1525   LEUKOCYTESUR Negative 02/03/2020 1515   LEUKOCYTESUR NEGATIVE 09/22/2019 1525      Imaging Results:    DG Chest Port 1 View  Result Date: 08/05/2020 CLINICAL DATA:  Questionable sepsis EXAM: PORTABLE CHEST 1 VIEW COMPARISON:  None. FINDINGS: Cardiomegaly. Left lower lobe airspace opacity, likely pneumonia. No confluent opacity on the right. No effusions. No acute bony abnormality. IMPRESSION: Left lower lobe airspace opacity concerning for pneumonia. Electronically Signed   By: Rolm Baptise M.D.   On: 08/05/2020 00:45   DG Foot Complete Right  Result Date:  08/05/2020 CLINICAL DATA:  Wound on right heel. EXAM: RIGHT FOOT COMPLETE - 3+ VIEW COMPARISON:  None. FINDINGS: Small plantar calcaneal spur. No bone destruction to suggest osteomyelitis. No acute bony abnormality. Specifically, no fracture, subluxation, or dislocation. Diffuse vascular calcifications. IMPRESSION: No acute bony abnormality. Electronically Signed   By: Rolm Baptise M.D.   On: 08/05/2020 00:45    My personal review of  EKG: Rhythm A. fib with RVR rate of 131   Assessment & Plan:    Principal Problem:   Sepsis ([place]) Active Problems:   ATRIAL FIBRILLATION   Dysphagia   Type 2 diabetes mellitus ([place])   1. Sepsis secondary to pneumonia 1. Temp 103.7, heart rate 120, white blood cell count 19.3, endorgan damage= acute metabolic encephalopathy 2. Lactic acid 1.5, repeat pending 3. Pneumonia finding on chest x-ray 4. UA and urine culture pending 5. Blood culture pending 6. Vancomycin, cefepime, Flagyl started 7. 2 L bolus 8. Continue 150 mL infusion 9. Pro-Cal in the a.m. 10. Continue to monitor on stepdown 2. Acute metabolic encephalopathy 1. Secondary to above 2. Continue treatment as above 3. Dementia but usually able to hold a conversation recognize people around him, ambulates with walker at baseline 3. A. fib with RVR 1. History of A. Fib 2. Continue diltiazem as long as blood pressure can tolerate 3. Heart rate improved with fluids 4. Continue fluids 5. Most likely RVR triggered by sepsis 6. Continue to monitor 4. Dysphagia 1. Speech consult for diet recommendations 5. Diabetes mellitus type 2 1. Sliding scale coverage 2. Hemoglobin A1c pending 6. Dehydration 1. Slight bump in creatinine from 0.92-1.13 2. BUN to creatinine ratio is 27:1.13 3. Continue fluids as above 7. Dry gangrene 1. Consult surgery for completeness possible wound care advised 2. Do not anticipate surgical procedure on his 87 year old gentleman with peripheral vascular disease 8.    DVT Prophylaxis-   Eliquis- SCDs   AM Labs Ordered, also please review Full Orders  Family Communication: Admission, patients condition and plan of care including tests being ordered have been discussed with the patient and daughter who indicate understanding and agree with the plan and Code Status.  Code Status: DNR  Admission status: Inpatient :The appropriate admission status for this patient is INPATIENT. Inpatient status  is judged to be reasonable and necessary in order to provide the required intensity of service to ensure the patient's safety. The patient's presenting symptoms, physical exam findings, and initial radiographic and laboratory data in the context of their chronic comorbidities is felt to place them at high risk for further clinical deterioration. Furthermore, it is not anticipated that the patient will be medically stable for discharge from the hospital within 2 midnights of admission. The following factors support the admission status of inpatient.     The patient's presenting symptoms include cough fever The worrisome physical exam findings include SIRS criteria. The initial radiographic and laboratory data are worrisome because of pneumonia on chest x-ray with leukocytosis The chronic co-morbidities include A. fib hypertension diabetes mellitus esophageal diverticulum dry gangrene       * I certify that at the point of admission it is my clinical judgment that the patient will require inpatient hospital care spanning beyond 2 midnights from the point of admission due to high intensity of service, high risk for further deterioration and high frequency of surveillance required.*  Time spent in minutes :[place]

## 2020-08-05 NOTE — Progress Notes (Signed)
Pt being followed by [facility] for sepsis protocol. 

## 2020-08-05 NOTE — Consult Note (Signed)
Reason for Consult: Dry gangrene of right great toe Referring Physician: Dr. [name] is an 87 y.o. male.  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  He was admitted to the hospital yesterday for sepsis secondary to pneumonia.  Surgery was consulted concerning ongoing wound care for the right great toe.  Family states that the right great toe has been painted with Betadine in order to dry out.  It was felt that the patient's great toe would ultimately auto amputate.  Apparently he did have some redness at the base of the right great toe as well as some along the sole.  He was started on vancomycin.  It has since gotten less red.  Past Medical History:  Diagnosis Date  . Acute bronchitis   . Acute lower UTI 11/25/2017  . Amputation of toe of left foot ([place]) 07/30/2017, 08/27/2017    Great toe 07/30/2017 Second toe 08/27/2017   . Arthritis   . Aspiration pneumonia ([place]) 01/31/2018  . Atrial fibrillation ([place])    Dr. Harl Bowie- [facility] follows saw 11'14  . Bladder stones    tx. with oral meds and antibiotics, now surgery planned  . Bone metastases ([place]) 08/21/2015  . Cataracts, both eyes    surgery planned May 2015  . Community acquired pneumonia 04/16/2018  . Cystoid macular edema 04/21/2015    Right eye 04/21/2015   . Dehydration 04/16/2018  . Dementia without behavioral disturbance ([place]) 04/16/2018  . Diabetes mellitus without complication ([place])    Type II  . Diarrhea 11/25/2017  . Dyspnea    with activity  . Dysrhythmia    Afib  . Family history of breast cancer   . Family history of colon cancer   . Genetic testing 02/09/2017  . GERD (gastroesophageal reflux disease)   . 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 Margot Chimes, thus far without recurrence.   Marland Kitchen History of MRI of lumbar spine 03/05/2014  . Hypertension   . Hypertrophy of prostate with urinary obstruction   . Hypomagnesemia 03/16/2017  . Metabolic encephalopathy 16/01/9603  . Metastatic adenocarcinoma to prostate ([place]) 09/08/2013  . 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 NHL (non-Hodgkin's lymphoma) ([place]) 2005   Diffuse large B-cell lymphoma, clinically stage IIIA, CD20 positive, status post cervical lymph node biopsy 08/03/2003 on the left. PET scan was also positive in the spleen and small bowel region, but bone marrow aspiration and biopsy were negative. So he essentially had stage IIIAs. He received R-CHOP x6 cycles with CR established by PET scan criteria on 11/23/2003 with no evidence for relapse th  . Numbness 02/19/2017   Per [place] new patient packet  . Osteomyelitis ([place]) 06/29/2017   toe of left foot   . PAD (peripheral artery disease) ([place]) 08/27/2017  . Polyneuropathy 02/19/2017  . Postural dizziness with presyncope 02/19/2017  . S/P TURP 08/10/2013  . Sepsis ([place]) 07/02/2018  . Skin cancer    Basal cell- face,head, neck,  arms, legs, Back  . Urinary frequency 02/19/2017    Past Surgical  History:  Procedure Laterality Date  . ABDOMINAL AORTOGRAM W/LOWER EXTREMITY N/A 09/20/2019   Procedure: ABDOMINAL AORTOGRAM W/LOWER EXTREMITY;  Surgeon: Marty Heck, MD;  Location: [place] [facility];  Service: Cardiovascular;  Laterality: N/A;  . AMPUTATION Left 06/29/2017   Procedure: AMPUTATION LEFT GREAT TOE;  Surgeon: [name], MD;  Location: [facility];  Service: Vascular;  Laterality: Left;  . AMPUTATION Left 08/27/2017   Procedure: AMPUTATION Left SECOND TOE;  Surgeon: [name], MD;   Location: [place] [facility];  Service: Vascular;  Laterality: Left;  . CATARACT EXTRACTION Bilateral 2015  . CHOLECYSTECTOMY    . COLON RESECTION     small bowel  . CYSTOSCOPY WITH LITHOLAPAXY N/A 08/10/2013   Procedure: CYSTOSCOPY WITH LITHOLAPAXY WITH [name];  Surgeon: [name], MD;  [facility];  Service: Urology;  Laterality: N/A;  . ESOPHAGEAL DILATION N/A 02/27/2016   Procedure: ESOPHAGEAL DILATION;  Surgeon: [name], MD;  [facility];  Service: Endoscopy;  Laterality: N/A;  . ESOPHAGEAL DILATION N/A 07/07/2018   Procedure: ESOPHAGEAL DILATION;  Surgeon: [name], MD;  [facility];  Service: Endoscopy;  Laterality: N/A;  . ESOPHAGOGASTRODUODENOSCOPY N/A 02/27/2016   web in proximal esophagus s/p dilation, small diverticulum in lower third of esophagus with food debris, benign-appearing esophageal stenosis distal to diverticulum dilated by passing 17 F dilator, mild Schatzki ring s/p dilation, 3 cm hiatal hernia, portal gastropathy, few gastric polyps, normal duodenum.   . ESOPHAGOGASTRODUODENOSCOPY N/A 07/07/2018   abnormal esophageal motility, non-critical Schatzki's ring s/p dilation with balloon but still intact, therefore disrupted with focal biopsy at 3 points, 4 cm sliding hiatal hernia, normal stomach and duodenum.   . INCISION AND DRAINAGE PERIRECTAL ABSCESS    . LOWER EXTREMITY ANGIOGRAPHY N/A 06/25/2017   Procedure: LOWER EXTREMITY ANGIOGRAPHY;  Surgeon: [name], MD;  Location: [facility];  Service: Cardiovascular;  Laterality: N/A;  . LYMPH NODE DISSECTION Left    '05-neck  . PERIPHERAL VASCULAR ATHERECTOMY Right 09/20/2019   Procedure: PERIPHERAL VASCULAR ATHERECTOMY;  Surgeon: Marty Heck, MD;  Location: [facility];  Service: Cardiovascular;  Laterality: Right;  Posterior tibial  . PORT-A-CATH REMOVAL    . PORTACATH PLACEMENT     insertion and removal -last chemotherapy 10 yrs ago  . TRANSURETHRAL  RESECTION OF PROSTATE N/A 08/10/2013   Procedure: TRANSURETHRAL RESECTION OF THE PROSTATE WITH GYRUS INSTRUMENTS;  Surgeon: [name], MD;  [facility];  Service: Urology;  Laterality: N/A;    Family History  Problem Relation Age of Onset  . Heart attack Mother        died in her 14s  . Other Father 61       pneumonia - died  . Breast cancer Sister        dx in her 18s-60s  . Colon cancer Brother        dx in his 89s  . Cancer Sister        TAH/BSO due to "male cancer"  . Breast cancer Daughter 62  . Breast cancer Daughter 63       DCIS - ATM VUS on Invitae 83 gene panel in 2018  . Breast cancer Daughter 39       DCIS - Negative on My[employer] MyRisk 25 gene apnel in 2015  . Thyroid cancer Daughter 18       Medullary and Papillary  . Thyroid nodules Grandchild     Social History:  reports that he has never smoked. He has never used smokeless tobacco. He reports that he does not drink alcohol and does not use drugs.  Allergies:  Allergies  Allergen Reactions  . Tape Other (See Comments)    SKIN IS VERY THIN AND WILL TEAR AND BRUISE VERY EASILY!!!!  . Augmentin [Amoxicillin-Pot Clavulanate] Rash and Other (See Comments)    Redness of skin    Medications: I have reviewed the patient's current medications.  Results for orders placed or performed during the hospital encounter of 08/05/20 (from the past 48 hour(s))  Resp Panel by RT-PCR (Flu A&B, Covid) Nasopharyngeal Swab     Status: None   Collection Time: 08/05/20 12:15 AM   Specimen: Nasopharyngeal Swab; Nasopharyngeal(NP) swabs in vial transport medium  Result Value Ref Range   SARS Coronavirus 2 by RT PCR NEGATIVE NEGATIVE    Comment: (NOTE) SARS-CoV-2 target nucleic acids are NOT DETECTED.  The SARS-CoV-2 RNA is generally detectable in upper respiratory specimens during the acute phase of infection. The lowest concentration of SARS-CoV-2 viral copies this assay can detect is 138 copies/mL. A negative  result does not preclude SARS-Cov-2 infection and should not be used as the sole basis for treatment or other patient management decisions. A negative result may occur with  improper specimen collection/handling, submission of specimen other than nasopharyngeal swab, presence of viral mutation(s) within the areas targeted by this assay, and inadequate number of viral copies(<138 copies/mL). A negative result must be combined with clinical observations, patient history, and epidemiological information. The expected result is Negative.  Fact Sheet for Patients:  EntrepreneurPulse.com.au  Fact Sheet for Healthcare Providers:  IncredibleEmployment.be  This test is no t yet approved or cleared by the [place] FDA and  has been authorized for detection and/or diagnosis of SARS-CoV-2 by FDA under an Emergency Use Authorization (EUA). This EUA will remain  in effect (meaning this test can be used) for the duration of the COVID-19 declaration under Section 564(b)(1) of the Act, 21 U.S.C.section 360bbb-3(b)(1), unless the authorization is terminated  or revoked sooner.       Influenza A by PCR NEGATIVE NEGATIVE   Influenza B by PCR NEGATIVE NEGATIVE    Comment: (NOTE) The Xpert Xpress SARS-CoV-2/FLU/RSV plus assay is intended as an aid in the diagnosis of influenza from Nasopharyngeal swab specimens and should not be used as a sole basis for treatment. Nasal washings and aspirates are unacceptable for Xpert Xpress SARS-CoV-2/FLU/RSV testing.  Fact Sheet for Patients: EntrepreneurPulse.com.au  Fact Sheet for Healthcare Providers: IncredibleEmployment.be  This test is not yet approved or cleared by the [place] FDA and has been authorized for detection and/or diagnosis of SARS-CoV-2 by FDA under an Emergency Use Authorization (EUA). This EUA will remain in effect (meaning this test can be used) for the  duration of the COVID-19 declaration under Section 564(b)(1) of the Act, 21 U.S.C. section 360bbb-3(b)(1), unless the authorization is terminated or revoked.  Performed at [facility], [address] [place], [place] [address]   Lactic acid, plasma     Status: None   Collection Time: 08/05/20 12:15 AM  Result Value Ref Range   Lactic Acid, Venous 1.5 0.5 - 1.9 mmol/L    Comment: Performed at [facility], [address] [place], [place] [address]  Comprehensive metabolic panel     Status: Abnormal   Collection Time: 08/05/20 12:15 AM  Result Value Ref Range   Sodium 138 135 - 145 mmol/L   Potassium 4.0 3.5 -  5.1 mmol/L   Chloride 112 (H) 98 - 111 mmol/L   CO2 15 (L) 22 - 32 mmol/L   Glucose, Bld 188 (H) 70 - 99 mg/dL    Comment: Glucose reference range applies only to samples taken after fasting for at least 8 hours.   BUN 27 (H) 8 - 23 mg/dL   Creatinine, Ser 1.13 0.61 - 1.24 mg/dL   Calcium 8.3 (L) 8.9 - 10.3 mg/dL   Total Protein 6.8 6.5 - 8.1 g/dL   Albumin 2.9 (L) 3.5 - 5.0 g/dL   AST 11 (L) 15 - 41 U/L   ALT 9 0 - 44 U/L   Alkaline Phosphatase 81 38 - 126 U/L   Total Bilirubin 0.6 0.3 - 1.2 mg/dL   GFR, Estimated >60 >60 mL/min    Comment: (NOTE) Calculated using the CKD-EPI Creatinine Equation (2021)    Anion gap 11 5 - 15    Comment: Performed at [facility], [address] [place], [place] [address]  CBC WITH DIFFERENTIAL     Status: Abnormal   Collection Time: 08/05/20 12:15 AM  Result Value Ref Range   WBC 19.3 (H) 4.0 - 10.5 K/uL   RBC 3.02 (L) 4.22 - 5.81 MIL/uL   Hemoglobin 8.5 (L) 13.0 - 17.0 g/dL   HCT 25.5 (L) 39.0 - 52.0 %   MCV 84.4 80.0 - 100.0 fL   MCH 28.1 26.0 - 34.0 pg   MCHC 33.3 30.0 - 36.0 g/dL   RDW 13.5 11.5 - 15.5 %   Platelets 378 150 - 400 K/uL   nRBC 0.0 0.0 - 0.2 %   Neutrophils Relative % 79 %   Neutro Abs 15.2 (H) 1.7 - 7.7 K/uL   Lymphocytes Relative 5 %   Lymphs Abs 1.0 0.7 - 4.0 K/uL   Monocytes Relative 11 %   Monocytes  Absolute 2.2 (H) 0.1 - 1.0 K/uL   Eosinophils Relative 1 %   Eosinophils Absolute 0.1 0.0 - 0.5 K/uL   Basophils Relative 0 %   Basophils Absolute 0.0 0.0 - 0.1 K/uL   Immature Granulocytes 4 %   Abs Immature Granulocytes 0.70 (H) 0.00 - 0.07 K/uL    Comment: Performed at [facility], [address] [place], [place] [address]  Protime-INR     Status: Abnormal   Collection Time: 08/05/20 12:15 AM  Result Value Ref Range   Prothrombin Time 23.5 (H) 11.4 - 15.2 seconds   INR 2.1 (H) 0.8 - 1.2    Comment: (NOTE) INR goal varies based on device and disease states. Performed at [facility], [address] [place], [place] [address]   APTT     Status: Abnormal   Collection Time: 08/05/20 12:15 AM  Result Value Ref Range   aPTT 41 (H) 24 - 36 seconds    Comment:        IF BASELINE aPTT IS ELEVATED, SUGGEST PATIENT RISK ASSESSMENT BE USED TO DETERMINE APPROPRIATE ANTICOAGULANT THERAPY. Performed at [facility], [address] [place], [place] [address]   Blood Culture (routine x 2)     Status: None (Preliminary result)   Collection Time: 08/05/20 12:15 AM   Specimen: BLOOD RIGHT FOREARM  Result Value Ref Range   Specimen Description BLOOD RIGHT FOREARM    Special Requests      BOTTLES DRAWN AEROBIC AND ANAEROBIC Blood Culture adequate volume   Culture      NO GROWTH < 12 HOURS Performed at [facility], [address]  [place], [place] [address]    Report Status PENDING   Blood Culture (routine x 2)     Status: None (Preliminary result)   Collection Time: 08/05/20 12:20 AM   Specimen: BLOOD LEFT FOREARM  Result Value Ref Range   Specimen Description BLOOD LEFT FOREARM    Special Requests      BOTTLES DRAWN AEROBIC AND ANAEROBIC Blood Culture adequate volume   Culture      NO GROWTH < 12 HOURS Performed at [facility], [address] [place], [place] [address]    Report Status PENDING   MRSA PCR Screening     Status: None   Collection Time: 08/05/20  4:13 AM   Specimen:  Nasal Mucosa; Nasopharyngeal  Result Value Ref Range   MRSA by PCR NEGATIVE NEGATIVE    Comment:        The GeneXpert MRSA Assay (FDA approved for NASAL specimens only), is one component of a comprehensive MRSA colonization surveillance program. It is not intended to diagnose MRSA infection nor to guide or monitor treatment for MRSA infections. Performed at [facility], [address] [place], [place] [address]   Lactic acid, plasma     Status: None   Collection Time: 08/05/20  4:45 AM  Result Value Ref Range   Lactic Acid, Venous 1.2 0.5 - 1.9 mmol/L    Comment: Performed at [facility], [address] [place], [place] [address]  Blood gas, venous     Status: Abnormal   Collection Time: 08/05/20  4:45 AM  Result Value Ref Range   FIO2 21.00    pH, Ven 7.349 7.250 - 7.430   pCO2, Ven 32.0 (L) 44.0 - 60.0 mmHg   pO2, Ven 45.1 (H) 32.0 - 45.0 mmHg   Bicarbonate 18.2 (L) 20.0 - 28.0 mmol/L   Acid-base deficit 7.4 (H) 0.0 - 2.0 mmol/L   O2 Saturation 76.0 %   Patient temperature 37.8     Comment: Performed at [name], [address] [place], [place] [address]  Protime-INR     Status: Abnormal   Collection Time: 08/05/20  4:45 AM  Result Value Ref Range   Prothrombin Time 23.5 (H) 11.4 - 15.2 seconds   INR 2.1 (H) 0.8 - 1.2    Comment: Performed at [facility], [address] [place], [place] [address]  Cortisol-am, blood     Status: Abnormal   Collection Time: 08/05/20  4:45 AM  Result Value Ref Range   Cortisol - AM >100.0 (H) 6.7 - 22.6 ug/dL    Comment: RESULTS CONFIRMED BY MANUAL DILUTION Performed at [facility] [address], [place],  [address]   Procalcitonin     Status: None   Collection Time: 08/05/20  4:45 AM  Result Value Ref Range   Procalcitonin 1.80 ng/mL    Comment:        Interpretation: PCT > 0.5 ng/mL and <= 2 ng/mL: Systemic infection (sepsis) is possible, but other conditions are known to elevate PCT as well. (NOTE)        Sepsis PCT Algorithm           Lower Respiratory Tract                                      Infection PCT Algorithm    ----------------------------     ----------------------------         PCT <  0.25 ng/mL                PCT < 0.10 ng/mL          Strongly encourage             Strongly discourage   discontinuation of antibiotics    initiation of antibiotics    ----------------------------     -----------------------------       PCT 0.25 - 0.50 ng/mL            PCT 0.10 - 0.25 ng/mL               OR       >80% decrease in PCT            Discourage initiation of                                            antibiotics      Encourage discontinuation           of antibiotics    ----------------------------     -----------------------------         PCT >= 0.50 ng/mL              PCT 0.26 - 0.50 ng/mL                AND       <80% decrease in PCT             Encourage initiation of                                             antibiotics       Encourage continuation           of antibiotics    ----------------------------     -----------------------------        PCT >= 0.50 ng/mL                  PCT > 0.50 ng/mL               AND         increase in PCT                  Strongly encourage                                      initiation of antibiotics    Strongly encourage escalation           of antibiotics                                     -----------------------------                                           PCT <= 0.25 ng/mL  OR                                        > 80% decrease in PCT                                      Discontinue / Do not initiate                                             antibiotics  Performed at [address] [place], [place] [address]   Comprehensive metabolic panel     Status: Abnormal   Collection Time: 08/05/20  4:45 AM  Result Value Ref Range   Sodium 137 135 - 145 mmol/L    Potassium 4.1 3.5 - 5.1 mmol/L   Chloride 113 (H) 98 - 111 mmol/L   CO2 17 (L) 22 - 32 mmol/L   Glucose, Bld 164 (H) 70 - 99 mg/dL    Comment: Glucose reference range applies only to samples taken after fasting for at least 8 hours.   BUN 25 (H) 8 - 23 mg/dL   Creatinine, Ser 1.00 0.61 - 1.24 mg/dL   Calcium 7.6 (L) 8.9 - 10.3 mg/dL   Total Protein 5.6 (L) 6.5 - 8.1 g/dL   Albumin 2.4 (L) 3.5 - 5.0 g/dL   AST 12 (L) 15 - 41 U/L   ALT 8 0 - 44 U/L   Alkaline Phosphatase 64 38 - 126 U/L   Total Bilirubin 0.5 0.3 - 1.2 mg/dL   GFR, Estimated >60 >60 mL/min    Comment: (NOTE) Calculated using the CKD-EPI Creatinine Equation (2021)    Anion gap 7 5 - 15    Comment: Performed at [facility], [address] [place], [place] [address]  Magnesium     Status: Abnormal   Collection Time: 08/05/20  4:45 AM  Result Value Ref Range   Magnesium 0.5 (LL) 1.7 - 2.4 mg/dL    Comment: CRITICAL RESULT CALLED TO, READ BACK BY AND VERIFIED WITH: HEARN,J AT 6:00AM ON 08/05/20 BY Derrill Memo Performed at [facility], [address], [place],  [address]   CBC WITH DIFFERENTIAL     Status: Abnormal   Collection Time: 08/05/20  4:45 AM  Result Value Ref Range   WBC 20.6 (H) 4.0 - 10.5 K/uL   RBC 2.62 (L) 4.22 - 5.81 MIL/uL   Hemoglobin 7.3 (L) 13.0 - 17.0 g/dL   HCT 22.4 (L) 39.0 - 52.0 %   MCV 85.5 80.0 - 100.0 fL   MCH 27.9 26.0 - 34.0 pg   MCHC 32.6 30.0 - 36.0 g/dL   RDW 13.9 11.5 - 15.5 %   Platelets 261 150 - 400 K/uL   nRBC 0.0 0.0 - 0.2 %   Neutrophils Relative % 79 %   Neutro Abs 16.2 (H) 1.7 - 7.7 K/uL   Lymphocytes Relative 6 %   Lymphs Abs 1.3 0.7 - 4.0 K/uL   Monocytes Relative 13 %   Monocytes Absolute 2.7 (H) 0.1 - 1.0 K/uL   Eosinophils Relative 0 %   Eosinophils Absolute 0.1 0.0 - 0.5 K/uL   Basophils Relative 0 %   Basophils Absolute 0.0  0.0 - 0.1 K/uL   Immature Granulocytes 2 %   Abs Immature Granulocytes 0.35 (H) 0.00 - 0.07 K/uL    Comment: Performed at [facility], [address] [place], [place] [address]  Troponin I (High Sensitivity)     Status: Abnormal   Collection Time: 08/05/20  4:45 AM  Result Value Ref Range   Troponin I (High Sensitivity) 32 (H) <18 ng/L    Comment: (NOTE) Elevated high sensitivity troponin I (hsTnI) values and significant  changes across serial measurements may suggest ACS but many other  chronic and acute conditions are known to elevate hsTnI results.  Refer to the Links section for chest pain algorithms and additional  guidance. Performed at [facility], [address], [place], [place] [address]   Troponin I (High Sensitivity)     Status: Abnormal   Collection Time: 08/05/20  8:01 AM  Result Value Ref Range   Troponin I (High Sensitivity) 29 (H) <18 ng/L    Comment: (NOTE) Elevated high sensitivity troponin I (hsTnI) values and significant  changes across serial measurements may suggest ACS but many other  chronic and acute conditions are known to elevate hsTnI results.  Refer to the "Links" section for chest pain algorithms and additional  guidance. Performed at [facility], [address], [place], [place] [address]    *Note: Due to a large number of results and/or encounters for the requested time period, some results have not been displayed. A complete set of results can be found in Results Review.    DG Chest Port 1 View  Result Date: 08/05/2020 CLINICAL DATA:  Questionable sepsis EXAM: PORTABLE CHEST 1 VIEW COMPARISON:  None. FINDINGS: Cardiomegaly. Left lower lobe airspace opacity, likely pneumonia. No confluent opacity on the right. No effusions. No acute bony abnormality. IMPRESSION: Left lower lobe airspace opacity concerning for pneumonia. Electronically Signed   By: Rolm Baptise M.D.   On: 08/05/2020 00:45   DG Foot Complete Right  Result Date: 08/05/2020 CLINICAL DATA:  Wound on right heel. EXAM: RIGHT FOOT COMPLETE - 3+ VIEW COMPARISON:  None. FINDINGS: Small plantar calcaneal spur. No bone  destruction to suggest osteomyelitis. No acute bony abnormality. Specifically, no fracture, subluxation, or dislocation. Diffuse vascular calcifications. IMPRESSION: No acute bony abnormality. Electronically Signed   By: Rolm Baptise M.D.   On: 08/05/2020 00:45    ROS:  Review of systems not obtained due to patient factors.  Blood pressure (!) 80/41, pulse 96, temperature (!) 97.3 F (36.3 C), temperature source Axillary, resp. rate 19, height _0  (1.778 m), weight 58.5 kg, SpO2 100 %. Physical Exam: Pleasant white male who is resting comfortably in the bed. Right great toe with dry gangrene almost extending to the base of the right great toe.  I could not palpate pulses.  Minimal redness is noted at the base of the metatarsal head of the right great toe.  No purulent drainage is present.  No pitting edema is noted in the pretibial region.  No ascending cellulitis is noted.  Assessment/Plan: Impression: Dry gangrene, right great toe Sepsis due to pneumonia, peripheral vascular disease.  Multiple comorbidities. Plan: No need for surgical intervention at this time.  Would continue Betadine swabs to the right foot twice a day.  We will follow peripherally with you.  [name] 08/05/2020, 1:56 PM

## 2020-08-05 NOTE — Consult Note (Signed)
Referring Provider: Dr. Wynetta Emery  Primary Care Physician:  [name], NP Primary Gastroenterologist:  Dr. Jenetta Downer   Date of Admission: 08/05/20 Date of Consultation: 08/05/20  Reason for Consultation:  Esophageal stricture and dysphagia   HPI:  [name] is a 87 y.o. 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 Delton Coombes, history of dysphagia with dilations in the past and most recently March 2020, presenting to the ED with shortness of breath and fever, confusion, weakness, choking after eating mandarin oranges with worsening coughing yesterday. Admitted with sepsis secondary to pneumonia, dry gangrene of right great toe, afib with RVR, acute metabolic encephalopathy in setting of acute illness, with GI now consulted due to history of dysphagia and esophageal stricture.   Daughter is at bedside. Patient unable to provide information and is resting comfortable. Daughter states that since March 2022 after ED visit in [place] with choking episode, that he has been eating pudding, ice cream, and applesauce without difficulty until yesterday when he ate canned mandarin oranges. He has been evaluated already by Dr. Jenetta Downer as outpatient with BPE April 2022 showing subjective narrowing of distal esophagus, 12.5 mm tablet lodged in distal esophageal diverticulum, retaining a large amount of contrast with subsequent reflux of contrast to level of aortic arch. Also second developing small distal esophageal diverticulum. Laryngeal penetration without aspiration although vallecular, piriform sinus, and oral residuals noted. Speech therapy recommended. Plans for EGD with  possible dilation and Botox injection as outpatient for May 2022. Prior EGDs in 2020 and 2017 as noted  below. CT chest in March with dilated esophagus, air-filled proximally, containing dependent intraluminal contents in mid and distal aspect, mild irregular distal esophageal wall thickening, suspected chronic dysmotility. Chronic pancreatitis.  Daughter notes history of chronic looser stools his whole life. No constipation. No overt GI bleeding. Desires endoscopy if appropriate once improved from respiratory standpoint. She is well aware of multiple comorbidities and wants to continue with measures that treat the treatable but does not want to pursue invasive procedures if will not add to quality of life or improve outcomes. Patient is DNR. Palliative has also been consulted. Speech has been working with patient at home through [facility].  Prior to admission, patient required assistance with getting up out of bed/chair, and used walker. Assistance needed with toileting. Previously would carry on conversations, enjoy watching the [place].   Past Medical History:  Diagnosis Date  . Acute bronchitis   . Acute lower UTI 11/25/2017  . Amputation of toe of left foot ([place]) 07/30/2017, 08/27/2017    Great toe 07/30/2017 Second toe 08/27/2017   . Arthritis   . Aspiration pneumonia ([place]) 01/31/2018  . Atrial fibrillation ([place])    Dr. Harl Bowie- [place] follows saw 11'14  . Bladder stones    tx. with oral meds and antibiotics, now surgery planned  . Bone metastases ([place]) 08/21/2015  . Cataracts, both eyes    surgery planned May 2015  . Community acquired pneumonia 04/16/2018  . Cystoid macular edema 04/21/2015    Right eye 04/21/2015   . Dehydration 04/16/2018  . Dementia without behavioral disturbance ([place]) 04/16/2018  . Diabetes mellitus without complication ([place])    Type II  . Diarrhea 11/25/2017  . Dyspnea    with activity  . Dysrhythmia    Afib  .  Family history of breast cancer   . Family history of colon cancer   . Genetic testing 02/09/2017  . GERD (gastroesophageal reflux  disease)   . 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 Margot Chimes, thus far without recurrence.   Marland Kitchen History of MRI of lumbar spine 03/05/2014  . Hypertension   . Hypertrophy of prostate with urinary obstruction   . Hypomagnesemia 03/16/2017  . Metabolic encephalopathy 18/84/1660  . Metastatic adenocarcinoma to prostate ([place]) 09/08/2013  . 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 NHL (non-Hodgkin's lymphoma) ([place]) 2005   Diffuse large B-cell lymphoma, clinically stage IIIA, CD20 positive, status post cervical lymph node biopsy 08/03/2003 on the left. PET scan was also positive in the spleen and small bowel region, but bone marrow aspiration and biopsy were negative. So he essentially had stage IIIAs. He received R-CHOP x6 cycles with CR established by PET scan criteria on 11/23/2003 with no evidence for relapse th  . Numbness 02/19/2017   Per [place] new patient packet  . Osteomyelitis ([place]) 06/29/2017   toe of left foot   . PAD (peripheral artery disease) ([place]) 08/27/2017  . Polyneuropathy 02/19/2017  . Postural dizziness with presyncope 02/19/2017  . S/P TURP 08/10/2013  . Sepsis ([place]) 07/02/2018  . Skin cancer    Basal cell- face,head, neck,  arms, legs, Back  . Urinary frequency 02/19/2017    Past Surgical History:  Procedure Laterality Date  . ABDOMINAL AORTOGRAM W/LOWER EXTREMITY N/A 09/20/2019   Procedure: ABDOMINAL AORTOGRAM W/LOWER EXTREMITY;  Surgeon: Marty Heck, MD;  Location: [facility];  Service: Cardiovascular;  Laterality: N/A;  . AMPUTATION Left 06/29/2017   Procedure: AMPUTATION LEFT GREAT TOE;  Surgeon: [name], MD;  Location: [place];  Service: Vascular;  Laterality: Left;  . AMPUTATION Left 08/27/2017    Procedure: AMPUTATION Left SECOND TOE;  Surgeon: [name], MD;  Location: [facility];  Service: Vascular;  Laterality: Left;  . CATARACT EXTRACTION Bilateral 2015  . CHOLECYSTECTOMY    . COLON RESECTION     small bowel  . CYSTOSCOPY WITH LITHOLAPAXY N/A 08/10/2013   Procedure: CYSTOSCOPY WITH LITHOLAPAXY WITH [name];  Surgeon: [name], MD;  [facility];  Service: Urology;  Laterality: N/A;  . ESOPHAGEAL DILATION N/A 02/27/2016   Procedure: ESOPHAGEAL DILATION;  Surgeon: [name], MD;  [facility];  Service: Endoscopy;  Laterality: N/A;  . ESOPHAGEAL DILATION N/A 07/07/2018   Procedure: ESOPHAGEAL DILATION;  Surgeon: [name], MD;  [facility];  Service: Endoscopy;  Laterality: N/A;  . ESOPHAGOGASTRODUODENOSCOPY N/A 02/27/2016   web in proximal esophagus s/p dilation, small diverticulum in lower third of esophagus with food debris, benign-appearing esophageal stenosis distal to diverticulum dilated by passing 2 F dilator, mild Schatzki ring s/p dilation, 3 cm hiatal hernia, portal gastropathy, few gastric polyps, normal duodenum.   . ESOPHAGOGASTRODUODENOSCOPY N/A 07/07/2018   abnormal esophageal motility, non-critical Schatzki's ring s/p dilation with balloon but still intact, therefore disrupted with focal biopsy at 3 points, 4 cm sliding hiatal hernia, normal stomach and duodenum.   . INCISION AND DRAINAGE PERIRECTAL ABSCESS    . LOWER EXTREMITY ANGIOGRAPHY N/A 06/25/2017   Procedure: LOWER EXTREMITY ANGIOGRAPHY;  Surgeon: [name], MD;  Location: [facility]  INVASIVE CV LAB;  Service: Cardiovascular;  Laterality: N/A;  . LYMPH NODE DISSECTION Left    '05-neck  . PERIPHERAL VASCULAR ATHERECTOMY Right 09/20/2019   Procedure: PERIPHERAL VASCULAR ATHERECTOMY;  Surgeon: Marty Heck, MD;  Location: [facility];  Service: Cardiovascular;  Laterality: Right;  Posterior tibial  . PORT-A-CATH REMOVAL    . PORTACATH PLACEMENT      insertion and removal -last chemotherapy 10 yrs ago  . TRANSURETHRAL RESECTION OF PROSTATE N/A 08/10/2013   Procedure: TRANSURETHRAL RESECTION OF THE PROSTATE WITH GYRUS INSTRUMENTS;  Surgeon: [name], MD;  [facility];  Service: Urology;  Laterality: N/A;    Prior to Admission medications   Medication Sig Start Date End Date Taking? Authorizing Provider  acetaminophen (TYLENOL) 500 MG tablet Take 500 mg by mouth at bedtime.    [provider]  albuterol (2.5 MG/3ML) 0.083% NEBU 3 mL, albuterol (5 MG/ML) 0.5% NEBU 0.5 mL Inhale into the lungs 3 (three) times daily as needed.    [provider]  apixaban (ELIQUIS) 5 MG TABS tablet Take 1 tablet (5 mg total) by mouth 2 (two) times daily. 04/15/20   Arnoldo Lenis, MD  Aspirin 81 MG CAPS Take 81 mg by mouth daily.     [provider]  blood glucose meter kit and supplies Dispense based on patient and insurance preference. Use four times daily as directed. (FOR ICD-10 E10.9, E11.9). 03/30/19   Corum, Rex Kras, MD  cephALEXin (KEFLEX) 250 MG capsule Take 250 mg by mouth at bedtime. Preventative for UTI prescribed by urology    [provider]  Cholecalciferol (VITAMIN D3) 1000 units CAPS Take 1,000 Units by mouth daily.     [provider]  Cranberry 500 MG TABS Take 500 mg by mouth daily. Patient not taking: Reported on 08/01/2020    [provider]  denosumab (XGEVA) 120 MG/1.7ML SOLN injection Inject 120 mg into the skin every 3 (three) months.    [provider]  Dextromethorphan-guaiFENesin [facility] DM PO) Take by mouth daily.    [provider]  diclofenac Sodium (VOLTAREN) 1 % GEL Apply 2 g topically as needed.    [provider]  diltiazem (CARDIZEM SR) 60 MG 12 hr capsule Take 1 capsule (60 mg total) by mouth 2 (two) times daily. 05/14/20   Imogene Burn, PA-C  donepezil (ARICEPT) 10 MG tablet Take 1 tablet by mouth once daily 06/11/20   [name], NP  doxycycline (VIBRA-TABS) 100 MG tablet Take 1 tablet (100 mg total) by mouth 2 (two) times daily for 14 days. 08/01/20 08/15/20  Medina-Vargas, Monina C, NP  furosemide (LASIX) 20 MG tablet Take 0.5 tablets (10 mg total) by mouth daily. Take for 5 day, then change to as needed for shortness of breath for pleural effusion. 04/16/20   Ngetich, Dinah C, NP  glipiZIDE (GLUCOTROL XL) 5 MG 24 hr tablet Take 1 tablet (5 mg total) by mouth daily with breakfast. 07/29/20   [name], NP  glucose blood (EASYMAX TEST) test strip Use as instructed 08/24/19   Maryruth Hancock, MD  Lancets 28G MISC 1 kit by Does not apply route every morning. 07/18/19   Maryruth Hancock, MD  lisinopril (ZESTRIL) 20 MG tablet Take 1 tablet by mouth once daily 07/08/20   Arnoldo Lenis, MD  metFORMIN (GLUCOPHAGE) 850 MG tablet Take 1 tablet (850 mg total) by mouth 2 (two) times daily with a meal. 05/31/20  [name], NP  mirtazapine (REMERON) 15 MG tablet Take 15 mg by mouth at bedtime.    [provider]  Multiple Vitamin (MULTIVITAMIN WITH MINERALS) TABS tablet Take 1 tablet by mouth daily. Patient not taking: Reported on 08/01/2020    [provider]  omeprazole (PRILOSEC) 40 MG capsule Take 1 capsule (40 mg total) by mouth daily. 06/18/20   [name], NP  Probiotic Product (PROBIOTIC DAILY PO) Take 1 tablet by mouth daily. Every morning     [provider]  simethicone (MYLICON) 80 MG chewable tablet Chew 80 mg by mouth daily.    [provider]    Current Facility-Administered Medications  Medication Dose Route Frequency Provider Last Rate Last Admin  . acetaminophen (TYLENOL) tablet 650 mg  650 mg Oral Q6H PRN Zierle-Ghosh, Asia B, DO       Or  . acetaminophen (TYLENOL) suppository 650 mg  650 mg Rectal Q6H PRN Zierle-Ghosh, Asia B, DO      . apixaban (ELIQUIS) tablet 5 mg  5 mg Oral BID Zierle-Ghosh, Asia B, DO      . aspirin chewable tablet 81 mg  81 mg  Oral Daily Zierle-Ghosh, Asia B, DO      . ceFEPIme (MAXIPIME) 2 g in sodium chloride 0.9 % 100 mL IVPB  2 g Intravenous Q12H Rancour, Stephen, MD      . Chlorhexidine Gluconate Cloth 2 % PADS 6 each  6 each Topical Daily Zierle-Ghosh, Asia B, DO      . dextromethorphan-guaiFENesin (MUCINEX DM) 30-600 MG per 12 hr tablet 1 tablet  1 tablet Oral Daily Zierle-Ghosh, Asia B, DO      . diltiazem (CARDIZEM SR) 12 hr capsule 60 mg  60 mg Oral BID Zierle-Ghosh, Asia B, DO      . donepezil (ARICEPT) tablet 10 mg  10 mg Oral Daily Zierle-Ghosh, Asia B, DO      . hydrocortisone sodium succinate (SOLU-CORTEF) 100 MG injection 50 mg  50 mg Intravenous Q6H Zierle-Ghosh, Asia B, DO   50 mg at 08/05/20 0431  . insulin aspart (novoLOG) injection 0-5 Units  0-5 Units Subcutaneous QHS Zierle-Ghosh, Asia B, DO      . insulin aspart (novoLOG) injection 0-9 Units  0-9 Units Subcutaneous TID WC Zierle-Ghosh, Asia B, DO   2 Units at 08/05/20 0808  . lactated ringers infusion   Intravenous Continuous Zierle-Ghosh, Asia B, DO 150 mL/hr at 08/05/20 0434 New Bag at 08/05/20 0434  . magnesium sulfate IVPB 2 g 50 mL  2 g Intravenous Once Johnson, Clanford L, MD      . MEDLINE mouth rinse  15 mL Mouth Rinse BID Zierle-Ghosh, Asia B, DO      . metroNIDAZOLE (FLAGYL) IVPB 500 mg  500 mg Intravenous Q8H Johnson, Clanford L, MD      . mirtazapine (REMERON) tablet 15 mg  15 mg Oral QHS Zierle-Ghosh, Asia B, DO      . ondansetron (ZOFRAN) tablet 4 mg  4 mg Oral Q6H PRN Zierle-Ghosh, Asia B, DO       Or  . ondansetron (ZOFRAN) injection 4 mg  4 mg Intravenous Q6H PRN Zierle-Ghosh, Asia B, DO      . pantoprazole (PROTONIX) EC tablet 40 mg  40 mg Oral Daily Zierle-Ghosh, Asia B, DO      . [START ON 08/06/2020] vancomycin (VANCOCIN) IVPB 1000 mg/200 mL premix  1,000 mg Intravenous Q24H Rancour, Annie Main, MD  Allergies as of 08/05/2020 - Review Complete 08/05/2020  Allergen Reaction Noted  . Tape Other (See Comments) 04/16/2018   . Augmentin [amoxicillin-pot clavulanate] Rash and Other (See Comments) 07/12/2017    Family History  Problem Relation Age of Onset  . Heart attack Mother        died in her 21s  . Other Father 84       pneumonia - died  . Breast cancer Sister        dx in her 46s-60s  . Colon cancer Brother        dx in his 70s  . Cancer Sister        TAH/BSO due to "male cancer"  . Breast cancer Daughter 82  . Breast cancer Daughter 33       DCIS - ATM VUS on [employer] 83 gene panel in 2018  . Breast cancer Daughter 3       DCIS - Negative on [employer] MyRisk 25 gene apnel in 2015  . Thyroid cancer Daughter 59       Medullary and Papillary  . Thyroid nodules Grandchild     Social History   Socioeconomic History  . Marital status: Married    Spouse name: Not on file  . Number of children: Not on file  . Years of education: Not on file  . Highest education level: Not on file  Occupational History  . Occupation: retired    Fish farm manager: RETIRED    Comment: salesman  Tobacco Use  . Smoking status: Never Smoker  . Smokeless tobacco: Never Used  Vaping Use  . Vaping Use: Never used  Substance and Sexual Activity  . Alcohol use: No  . Drug use: No  . Sexual activity: Not Currently  Other Topics Concern  . Not on file  Social History Narrative   Lives home with wife but family and home health care([place] [facility] [place])                  Social Determinants of Health   Financial Resource Strain: Not on file  Food Insecurity: Not on file  Transportation Needs: No Transportation Needs  . Lack of Transportation (Medical): No  . Lack of Transportation (Non-Medical): No  Physical Activity: Not on file  Stress: Not on file  Social Connections: Not on file  Intimate Partner Violence: Not on file    Review of Systems: Unable to obtain due to cognitive status  Physical Exam: Vital signs in last 24 hours: Temp:  [98.4 F (36.9 C)-103.7 F (39.8 C)] 98.4 F (36.9 C)  (04/18 0731) Pulse Rate:  [92-137] 96 (04/18 0400) Resp:  [14-36] 22 (04/18 0900) BP: (80-122)/(37-99) 80/37 (04/18 0900) SpO2:  [98 %-100 %] 100 % (04/18 0400) Weight:  [58.5 kg-60.8 kg] 58.5 kg (04/18 0437) Last BM Date: 08/04/20 General:   Resting in bed with eyes closed, stirs with touch but does not open eyes. Chronically ill and cachectic-appearing Head:  Normocephalic and atraumatic. Lungs:  Clear throughout to auscultation.  Diminished bases. No audible wheezing Heart:  Irregularly irregular, systolic murmur present Abdomen:  Soft, nontender and nondistended. Thin. No masses, hepatosplenomegaly or hernias noted. Normal bowel sounds, without guarding, and without rebound.   Rectal:  Deferred  Msk:  Symmetrical without gross deformities. Thin extremities  Extremities:  With left great toe amputated, right great toe with dressing Neurologic:  Unable to assess    Intake/Output from previous day: No intake/output data recorded. Intake/Output this shift: No  intake/output data recorded.  Lab Results: Recent Labs    08/05/20 0015 08/05/20 0445  WBC 19.3* 20.6*  HGB 8.5* 7.3*  HCT 25.5* 22.4*  PLT 378 261   BMET Recent Labs    08/05/20 0015 08/05/20 0445  NA 138 137  K 4.0 4.1  CL 112* 113*  CO2 15* 17*  GLUCOSE 188* 164*  BUN 27* 25*  CREATININE 1.13 1.00  CALCIUM 8.3* 7.6*   LFT Recent Labs    08/05/20 0015 08/05/20 0445  PROT 6.8 5.6*  ALBUMIN 2.9* 2.4*  AST 11* 12*  ALT 9 8  ALKPHOS 81 64  BILITOT 0.6 0.5   PT/INR Recent Labs    08/05/20 0015  LABPROT 23.5*  INR 2.1*     Studies/Results: DG Chest Port 1 View  Result Date: 08/05/2020 CLINICAL DATA:  Questionable sepsis EXAM: PORTABLE CHEST 1 VIEW COMPARISON:  None. FINDINGS: Cardiomegaly. Left lower lobe airspace opacity, likely pneumonia. No confluent opacity on the right. No effusions. No acute bony abnormality. IMPRESSION: Left lower lobe airspace opacity concerning for pneumonia.  Electronically Signed   By: Rolm Baptise M.D.   On: 08/05/2020 00:45   DG Foot Complete Right  Result Date: 08/05/2020 CLINICAL DATA:  Wound on right heel. EXAM: RIGHT FOOT COMPLETE - 3+ VIEW COMPARISON:  None. FINDINGS: Small plantar calcaneal spur. No bone destruction to suggest osteomyelitis. No acute bony abnormality. Specifically, no fracture, subluxation, or dislocation. Diffuse vascular calcifications. IMPRESSION: No acute bony abnormality. Electronically Signed   By: Rolm Baptise M.D.   On: 08/05/2020 00:45   Prior endoscopic procedures:   EGD March 2020: abnormal esophageal motility, non-critical Schatzki's ring s/p dilation with balloon but still intact, therefore disrupted with focal biopsy at 3 points, 4 cm sliding hiatal hernia, normal stomach and duodenum.   Prior dilations: EGD 2017 with web in proximal esophagus s/p dilation, small diverticulum in lower third of esophagus with food debris, benign-appearing esophageal stenosis distal to diverticulum dilated by passing 87 F dilator, mild Schatzki ring s/p dilation, 3 cm hiatal hernia, portal gastropathy, few gastric polyps, normal duodenum.     Impression:  87 y.o. 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 Delton Coombes, history of dysphagia with dilations in the past and most recently March 2020, presenting to the ED with sepsis secondary to pneumonia, dry gangrene of right great toe, afib with RVR, acute metabolic encephalopathy in setting of acute illness, and GI consulted due to history of dysphagia and esophageal stricture.   Dysphagia: known prior dilations with last in March 2020. Known esophageal dysmotility and concern for achalasia. Recent BPE April 2022 with subjective narrowing of distal esophagus, 12.5 mm tablet  lodged in distal esophageal diverticulum, retaining a large amount of contrast with subsequent reflux of contrast to level of aortic arch. Also second developing small distal esophageal diverticulum. Laryngeal penetration without aspiration although vallecular, piriform sinus, and oral residuals noted.  EGD +/- dilation and possible Botox injection had been planned as outpatient for May 2022. Notably, he has been only eating pudding, ice cream consistencies since March 2022 after presentation to the ED at [facility] for choking episode; he received Glucagon, Reglan, Benadryl and was discharged with improvement. Discussed with daughter at bedside, who desires endoscopy once patient is stabilized. At this point, he is not a candidate in  setting of sepsis. We can continue to follow along with you and continue to evaluate candidacy during hospitalization. Agree with Speech consultation. As he is on Eliquis, this would need to be held 2 days prior to EGD/dilation.    Chronic pancreatitis: pancreatic calcification and cystic changes seen on prior CTs. Cystic lesion of pancreatic tail in Aug 2019 on CT.    Plan: Agree with Speech evaluation and remain NPO until assessed PPI daily Not a candidate for endoscopic procedure currently due to acute illness; however, hopefully this can be performed prior to discharge if improving Would need to hold Eliquis 2 days prior GI will continue to follow with you for candidacy Agree with Palliative evaluation   Annitta Needs, PhD, ANP-BC [facility]     LOS: 0 days    08/05/2020, 9:15 AM

## 2020-08-05 NOTE — ED Notes (Signed)
MSE not obtained due to EDP at bedside during triage.

## 2020-08-05 NOTE — Progress Notes (Addendum)
Initial Nutrition Assessment  DOCUMENTATION CODES:   Severe malnutrition in context of chronic illness,Underweight  INTERVENTION:  RD will follow up once diet is advanced and make further recommendations.    NUTRITION DIAGNOSIS:   Severe Malnutrition related to chronic illness (metastatic cancer, dementia, Chronic pancreatitis, dysphagia) as evidenced by per patient/family report,severe fat depletion,severe muscle depletion (suboptimal intake -hx of swallow problem requiring multiple esophageal dilations).   GOAL:  Provide needs based on ASPEN/SCCM guidelines  MONITOR:  Diet advancement,Supplement acceptance,Labs,PO intake,Weight trends,Skin  REASON FOR ASSESSMENT:   Malnutrition Screening Tool    ASSESSMENT: patient is a 87 yo with metastatic cancer, dementia, DM 2, PVD, GERD, Chronic pancreatitis, dry gangrene right great toe and wound to heel. Surgery consulted- no immediate plan for intervention per MD.   Presents with sepsis secondary to pneumonia. Patient has hx of swallow difficulty and dilations. GI and ST consulted.   Daughter at bedside and assisted with history. He is NPO pending ST evaluation. Patient has been limiting intake to chocolate pudding, ice cream (vanilla), Ensure (chocolate). Has upper dentures- no longer fit well and has a few remaining lower teeth.   His weight has been ranging between 134-140 lb  (61-63 kg) the past 6 months per family. Currently his weight is 128.7 lb (58.5 kg).   Medications: Aricept, Solu-cortef, Insulin, Remeron, protonix. Antibiotics.   IV- Lactated Ringers @ 150 ml/hr- stop @ 2029 today.    No intake or output data in the 24 hours ending 08/05/20 1540   Labs: BMP Latest Ref Rng & Units 08/05/2020 08/05/2020 07/30/2020  Glucose 70 - 99 mg/dL 164(H) 188(H) 108(H)  BUN 8 - 23 mg/dL 25(H) 27(H) 33(H)  Creatinine 0.61 - 1.24 mg/dL 1.00 1.13 1.20  Sodium 135 - 145 mmol/L 137 138 140  Potassium 3.5 - 5.1 mmol/L 4.1 4.0 4.6   Chloride 98 - 111 mmol/L 113(H) 112(H) 111  CO2 22 - 32 mmol/L 17(L) 15(L) 17(L)  Calcium 8.9 - 10.3 mg/dL 7.6(L) 8.3(L) 8.7(L)     NUTRITION - FOCUSED PHYSICAL EXAM:  Flowsheet Row Most Recent Value  Orbital Region Severe depletion  Upper Arm Region Severe depletion  Buccal Region Severe depletion  Temple Region Severe depletion  Clavicle Bone Region Severe depletion  Clavicle and Acromion Bone Region Severe depletion  Hair Unable to assess  [balding]  Eyes Reviewed  Mouth Reviewed  Skin Reviewed  Nails Unable to assess     Diet Order:   Diet Order            Diet NPO time specified  Diet effective now                 EDUCATION NEEDS:  No education needs have been identified at this time  Skin:  Skin Assessment:  (dry gangrene right great toe, sheer to sacrum and heel (scab area))  Last BM:  4/17 diarrhea per nursing  Height:   Ht Readings from Last 1 Encounters:  08/05/20 5\' 10"  (1.778 m)    Weight:   Wt Readings from Last 1 Encounters:  08/05/20 58.5 kg    Ideal Body Weight:   75 kg  BMI:  Body mass index is 18.51 kg/m.  Estimated Nutritional Needs:   Kcal:  2065-2200  Protein:  94-106 gr  Fluid:  >1700 ml daily    Colman Cater MS,RD,CSG,LDN Contact: Shea Evans

## 2020-08-05 NOTE — Progress Notes (Signed)
Pharmacy Antibiotic Note  [name] is a 87 y.o. male admitted on 08/05/2020 with wound infection.  Pharmacy has been consulted for aztreonam and vancomycin dosing. Has tolerated ancef and rocephin in past.  Will change aztreonam to cefepime.  Plan: Cefepime 2gm IV q12 hours Vancomycin 1gm IV x 1 then 1gm IV q24 hours F/u renal function, cultures and clinical course    No data recorded.  Recent Labs  Lab 07/30/20 1436  CREATININE 1.20    Estimated Creatinine Clearance: 32.4 mL/min (by C-G formula based on SCr of 1.2 mg/dL).    Allergies  Allergen Reactions  . Tape Other (See Comments)    SKIN IS VERY THIN AND WILL TEAR AND BRUISE VERY EASILY!!!!  . Augmentin [Amoxicillin-Pot Clavulanate] Rash and Other (See Comments)    Redness of skin   Thank you for allowing pharmacy to be a part of this patient's care.  Excell Seltzer Poteet 08/05/2020 12:24 AM

## 2020-08-05 NOTE — ED Triage Notes (Signed)
Family called EMS due to being concerned that pt is septic. Pt arrives with wound to right heel, SOB and tachycardic. EDP to bedside.

## 2020-08-05 NOTE — Evaluation (Signed)
Clinical/Bedside Swallow Evaluation Patient Details  Name: [name] MRN: [id number] Date of Birth: 03-31-1924  Today's Date: 08/05/2020 Time: SLP Start Time (ACUTE ONLY): 8242 SLP Stop Time (ACUTE ONLY): 1645 SLP Time Calculation (min) (ACUTE ONLY): 29 min  Past Medical History:  Past Medical History:  Diagnosis Date  . Acute bronchitis   . Acute lower UTI 11/25/2017  . Amputation of toe of left foot ([place]) 07/30/2017, 08/27/2017    Great toe 07/30/2017 Second toe 08/27/2017   . Arthritis   . Aspiration pneumonia ([place]) 01/31/2018  . Atrial fibrillation ([place])    Dr. Harl Bowie- [facility] follows saw 11'14  . Bladder stones    tx. with oral meds and antibiotics, now surgery planned  . Bone metastases ([place]) 08/21/2015  . Cataracts, both eyes    surgery planned May 2015  . Community acquired pneumonia 04/16/2018  . Cystoid macular edema 04/21/2015    Right eye 04/21/2015   . Dehydration 04/16/2018  . Dementia without behavioral disturbance ([place]) 04/16/2018  . Diabetes mellitus without complication ([place])    Type II  . Diarrhea 11/25/2017  . Dyspnea    with activity  . Dysrhythmia    Afib  . Family history of breast cancer   . Family history of colon cancer   . Genetic testing 02/09/2017  . GERD (gastroesophageal reflux disease)   . 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 Margot Chimes, thus far without recurrence.   Marland Kitchen History of MRI of lumbar spine 03/05/2014  . Hypertension   . Hypertrophy of prostate with urinary obstruction   . Hypomagnesemia 03/16/2017  . Metabolic encephalopathy 35/36/1443  . Metastatic adenocarcinoma to prostate ([place]) 09/08/2013  . 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 NHL (non-Hodgkin's lymphoma) ([place]) 2005    Diffuse large B-cell lymphoma, clinically stage IIIA, CD20 positive, status post cervical lymph node biopsy 08/03/2003 on the left. PET scan was also positive in the spleen and small bowel region, but bone marrow aspiration and biopsy were negative. So he essentially had stage IIIAs. He received R-CHOP x6 cycles with CR established by PET scan criteria on 11/23/2003 with no evidence for relapse th  . Numbness 02/19/2017   Per [facility] new patient packet  . Osteomyelitis ([place]) 06/29/2017   toe of left foot   . PAD (peripheral artery disease) ([place]) 08/27/2017  . Polyneuropathy 02/19/2017  . Postural dizziness with presyncope 02/19/2017  . S/P TURP 08/10/2013  . Sepsis ([place]) 07/02/2018  . Skin cancer    Basal cell- face,head, neck,  arms, legs, Back  . Urinary frequency 02/19/2017   Past Surgical History:  Past Surgical History:  Procedure Laterality Date  . ABDOMINAL AORTOGRAM W/LOWER EXTREMITY N/A 09/20/2019   Procedure: ABDOMINAL AORTOGRAM W/LOWER EXTREMITY;  Surgeon: Marty Heck, MD;  Location: [facility];  Service: Cardiovascular;  Laterality: N/A;  . AMPUTATION Left 06/29/2017   Procedure: AMPUTATION LEFT GREAT TOE;  Surgeon: [name], MD;  Location: [facility];  Service: Vascular;  Laterality: Left;  . AMPUTATION Left 08/27/2017   Procedure: AMPUTATION Left SECOND TOE;  Surgeon: [name], MD;  Location: [facility];  Service: Vascular;  Laterality: Left;  . CATARACT EXTRACTION Bilateral 2015  . CHOLECYSTECTOMY    . COLON RESECTION     small  bowel  . CYSTOSCOPY WITH LITHOLAPAXY N/A 08/10/2013   Procedure: CYSTOSCOPY WITH LITHOLAPAXY WITH [name];  Surgeon: [name], MD;  Location: [facility];  Service: Urology;  Laterality: N/A;  . ESOPHAGEAL DILATION N/A 02/27/2016   Procedure: ESOPHAGEAL DILATION;  Surgeon: [name], MD;  [facility];  Service: Endoscopy;  Laterality: N/A;  . ESOPHAGEAL DILATION N/A 07/07/2018   Procedure: ESOPHAGEAL  DILATION;  Surgeon: [name], MD;  [facility];  Service: Endoscopy;  Laterality: N/A;  . ESOPHAGOGASTRODUODENOSCOPY N/A 02/27/2016   web in proximal esophagus s/p dilation, small diverticulum in lower third of esophagus with food debris, benign-appearing esophageal stenosis distal to diverticulum dilated by passing 64 F dilator, mild Schatzki ring s/p dilation, 3 cm hiatal hernia, portal gastropathy, few gastric polyps, normal duodenum.   . ESOPHAGOGASTRODUODENOSCOPY N/A 07/07/2018   abnormal esophageal motility, non-critical Schatzki's ring s/p dilation with balloon but still intact, therefore disrupted with focal biopsy at 3 points, 4 cm sliding hiatal hernia, normal stomach and duodenum.   . INCISION AND DRAINAGE PERIRECTAL ABSCESS    . LOWER EXTREMITY ANGIOGRAPHY N/A 06/25/2017   Procedure: LOWER EXTREMITY ANGIOGRAPHY;  Surgeon: [name], MD;  Location: [facility];  Service: Cardiovascular;  Laterality: N/A;  . LYMPH NODE DISSECTION Left    '05-neck  . PERIPHERAL VASCULAR ATHERECTOMY Right 09/20/2019   Procedure: PERIPHERAL VASCULAR ATHERECTOMY;  Surgeon: Marty Heck, MD;  Location: [facility];  Service: Cardiovascular;  Laterality: Right;  Posterior tibial  . PORT-A-CATH REMOVAL    . PORTACATH PLACEMENT     insertion and removal -last chemotherapy 10 yrs ago  . TRANSURETHRAL RESECTION OF PROSTATE N/A 08/10/2013   Procedure: TRANSURETHRAL RESECTION OF THE PROSTATE WITH GYRUS INSTRUMENTS;  Surgeon: [name], MD;  Location: [facility];  Service: Urology;  Laterality: N/A;   HPI:  [name] is a 87 y.o. 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 Delton Coombes, history of dysphagia with dilations in the past and most  recently March 2020, presenting to the ED with shortness of breath and fever, confusion, weakness, choking after eating mandarin oranges with worsening coughing yesterday. Admitted with sepsis secondary to pneumonia, dry gangrene of right great toe, afib with RVR, acute metabolic encephalopathy in setting of acute illness, with GI now consulted due to history of dysphagia and esophageal stricture. Daughter states that since March 2022 after ED visit in [place] with choking episode, that he has been eating pudding, ice cream, and applesauce without difficulty until yesterday when he ate canned mandarin oranges. He has been evaluated already by Dr. Jenetta Downer as outpatient with BPE April 2022 showing subjective narrowing of distal esophagus, 12.5 mm tablet lodged in distal esophageal diverticulum, retaining a large amount of contrast with subsequent reflux of contrast to level of aortic arch. Also second developing small distal esophageal diverticulum. Laryngeal penetration without aspiration although vallecular, piriform sinus, and oral residuals noted. Speech therapy recommended. Plans for EGD with  possible dilation and Botox injection as outpatient for May 2022. Prior EGDs in 2020 and 2017 as noted below. CT chest in March with dilated esophagus, air-filled proximally, containing dependent intraluminal contents in mid and distal aspect, mild irregular distal esophageal wall thickening, suspected chronic dysmotility. BSE requested.   Assessment / Plan /  Recommendation Clinical Impression  Clinical swallow evaluation completed at bedside with family present. Pt is known to SLP from previous MBSS completed in 06/2018 with recommendation for D3/mech soft and thin liquids (ok for straw small sips and swallow 2x). He is being followed by GI due to esophageal dysphagia (see recent barium swallow) and was receiving home health SLP to assist with aspiration and reflux precautions. Pt assessed with ice chips, water via  tsp/cup/straw, and puree. Pt with cough following SLP presentation of thin via cup sips. No further coughing when Pt self presented cup and straw sips. He was reminded to take small sips and drink slowly with use of the straw. He consumed 4 oz applesauce slowly over the course of my visit. Recommend D1/puree and thin liquids, ok for straws, small sips, po medications whole or crushed as able in puree and follow with liquid wash. Pt to consume frequent, smaller meals due to esophageal dypshagia (esophageal diverticulum and narrowing). SLP will follow during acute stay and per GI plans. Above to RN, Pt, and family. SLP Visit Diagnosis: Dysphagia, unspecified (R13.10)    Aspiration Risk  Mild aspiration risk    Diet Recommendation Dysphagia 1 (Puree);Thin liquid   Liquid Administration via: Cup;Straw Medication Administration: Crushed with puree Supervision: Staff to assist with self feeding;Patient able to self feed;Full supervision/cueing for compensatory strategies Compensations: Slow rate;Small sips/bites;Multiple dry swallows after each bite/sip Postural Changes: Seated upright at 90 degrees;Remain upright for at least 30 minutes after po intake    Other  Recommendations Oral Care Recommendations: Oral care BID;Staff/trained caregiver to provide oral care Other Recommendations: Clarify dietary restrictions   Follow up Recommendations None      Frequency and Duration min 2x/week  1 week       Prognosis Prognosis for Safe Diet Advancement: Fair Barriers to Reach Goals:  (esophageal dysphagia)      Swallow Study   General Date of Onset: 08/05/20 HPI: [name] is a 87 y.o. 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 Delton Coombes, history of dysphagia with  dilations in the past and most recently March 2020, presenting to the ED with shortness of breath and fever, confusion, weakness, choking after eating mandarin oranges with worsening coughing yesterday. Admitted with sepsis secondary to pneumonia, dry gangrene of right great toe, afib with RVR, acute metabolic encephalopathy in setting of acute illness, with GI now consulted due to history of dysphagia and esophageal stricture. Daughter states that since March 2022 after ED visit in [place] with choking episode, that he has been eating pudding, ice cream, and applesauce without difficulty until yesterday when he ate canned mandarin oranges. He has been evaluated already by Dr. Jenetta Downer as outpatient with BPE April 2022 showing subjective narrowing of distal esophagus, 12.5 mm tablet lodged in distal esophageal diverticulum, retaining a large amount of contrast with subsequent reflux of contrast to level of aortic arch. Also second developing small distal esophageal diverticulum. Laryngeal penetration without aspiration although vallecular, piriform sinus, and oral residuals noted. Speech therapy recommended. Plans for EGD with  possible dilation and Botox injection as outpatient for May 2022. Prior EGDs in 2020 and 2017 as noted below. CT chest in March with dilated esophagus, air-filled proximally, containing dependent intraluminal contents in mid and distal aspect, mild irregular distal esophageal wall thickening, suspected  chronic dysmotility. BSE requested. Type of Study: Bedside Swallow Evaluation Previous Swallow Assessment: MBSS 07/04/18 D3/thin ok for straws, swallow 2x Diet Prior to this Study: NPO Temperature Spikes Noted: No Respiratory Status: Room air History of Recent Intubation: No Behavior/Cognition: Alert;Cooperative;Pleasant mood Oral Cavity Assessment: Within Functional Limits Oral Care Completed by SLP: Recent completion by staff Oral Cavity - Dentition:  (missing his upper  plate) Vision: Functional for self-feeding Self-Feeding Abilities: Able to feed self;Needs set up Patient Positioning: Upright in bed Baseline Vocal Quality: Normal;Low vocal intensity Volitional Cough: Strong Volitional Swallow: Able to elicit    Oral/Motor/Sensory Function Overall Oral Motor/Sensory Function: Within functional limits   Ice Chips Ice chips: Within functional limits Presentation: Spoon   Thin Liquid Thin Liquid: Impaired Presentation: Cup;Self Fed;Straw Pharyngeal  Phase Impairments: Cough - Delayed    Nectar Thick Nectar Thick Liquid: Not tested   Honey Thick Honey Thick Liquid: Not tested   Puree Puree: Within functional limits Presentation: Spoon   Solid     Solid: Not tested     Thank you,  Genene Churn, [place] 08/05/2020,5:08 PM

## 2020-08-06 ENCOUNTER — Ambulatory Visit (HOSPITAL_COMMUNITY): Payer: Medicare Other

## 2020-08-06 ENCOUNTER — Ambulatory Visit (HOSPITAL_COMMUNITY): Payer: Medicare Other | Admitting: Hematology

## 2020-08-06 DIAGNOSIS — E43 Unspecified severe protein-calorie malnutrition: Secondary | ICD-10-CM

## 2020-08-06 DIAGNOSIS — J9601 Acute respiratory failure with hypoxia: Secondary | ICD-10-CM

## 2020-08-06 LAB — CBC
HCT: 27.2 % — ABNORMAL LOW (ref 39.0–52.0)
Hemoglobin: 9 g/dL — ABNORMAL LOW (ref 13.0–17.0)
MCH: 28.7 pg (ref 26.0–34.0)
MCHC: 33.1 g/dL (ref 30.0–36.0)
MCV: 86.6 fL (ref 80.0–100.0)
Platelets: 275 10*3/uL (ref 150–400)
RBC: 3.14 MIL/uL — ABNORMAL LOW (ref 4.22–5.81)
RDW: 14.6 % (ref 11.5–15.5)
WBC: 12.2 10*3/uL — ABNORMAL HIGH (ref 4.0–10.5)
nRBC: 0 % (ref 0.0–0.2)

## 2020-08-06 LAB — HEMOGLOBIN A1C
Hgb A1c MFr Bld: 5.6 % (ref 4.8–5.6)
Mean Plasma Glucose: 114 mg/dL

## 2020-08-06 LAB — GLUCOSE, CAPILLARY
Glucose-Capillary: 151 mg/dL — ABNORMAL HIGH (ref 70–99)
Glucose-Capillary: 243 mg/dL — ABNORMAL HIGH (ref 70–99)
Glucose-Capillary: 328 mg/dL — ABNORMAL HIGH (ref 70–99)
Glucose-Capillary: 262 mg/dL — ABNORMAL HIGH (ref 70–99)

## 2020-08-06 LAB — COMPREHENSIVE METABOLIC PANEL
ALT: 7 U/L (ref 0–44)
AST: 9 U/L — ABNORMAL LOW (ref 15–41)
Albumin: 2.2 g/dL — ABNORMAL LOW (ref 3.5–5.0)
Alkaline Phosphatase: 54 U/L (ref 38–126)
Anion gap: 9 (ref 5–15)
BUN: 30 mg/dL — ABNORMAL HIGH (ref 8–23)
CO2: 17 mmol/L — ABNORMAL LOW (ref 22–32)
Calcium: 7.9 mg/dL — ABNORMAL LOW (ref 8.9–10.3)
Chloride: 114 mmol/L — ABNORMAL HIGH (ref 98–111)
Creatinine, Ser: 0.97 mg/dL (ref 0.61–1.24)
GFR, Estimated: >60 mL/min (ref >60)
Glucose, Bld: 155 mg/dL — ABNORMAL HIGH (ref 70–99)
Potassium: 4.6 mmol/L (ref 3.5–5.1)
Sodium: 140 mmol/L (ref 135–145)
Total Bilirubin: 0.6 mg/dL (ref 0.3–1.2)
Total Protein: 5.2 g/dL — ABNORMAL LOW (ref 6.5–8.1)

## 2020-08-06 LAB — MAGNESIUM: Magnesium: 1.4 mg/dL — ABNORMAL LOW (ref 1.7–2.4)

## 2020-08-06 LAB — PROSTATE-SPECIFIC AG, SERUM (LABCORP): Prostate Specific Ag, Serum: <0.1 ng/mL

## 2020-08-06 LAB — CBC WITH DIFFERENTIAL/PLATELET
Abs Immature Granulocytes: 0.25 10*3/uL — ABNORMAL HIGH (ref 0.00–0.07)
Basophils Absolute: 0 10*3/uL (ref 0.0–0.1)
Basophils Relative: 0 %
Eosinophils Absolute: 0 10*3/uL (ref 0.0–0.5)
Eosinophils Relative: 0 %
HCT: 18.3 % — ABNORMAL LOW (ref 39.0–52.0)
Hemoglobin: 5.8 g/dL — CL (ref 13.0–17.0)
Immature Granulocytes: 3 %
Lymphocytes Relative: 7 %
Lymphs Abs: 0.8 10*3/uL (ref 0.7–4.0)
MCH: 27.2 pg (ref 26.0–34.0)
MCHC: 31.7 g/dL (ref 30.0–36.0)
MCV: 85.9 fL (ref 80.0–100.0)
Monocytes Absolute: 0.5 10*3/uL (ref 0.1–1.0)
Monocytes Relative: 5 %
Neutro Abs: 8.6 10*3/uL — ABNORMAL HIGH (ref 1.7–7.7)
Neutrophils Relative %: 85 %
Platelets: 266 10*3/uL (ref 150–400)
RBC: 2.13 MIL/uL — ABNORMAL LOW (ref 4.22–5.81)
RDW: 14.1 % (ref 11.5–15.5)
WBC: 10.1 10*3/uL (ref 4.0–10.5)
nRBC: 0 % (ref 0.0–0.2)

## 2020-08-06 LAB — ABO/RH: ABO/RH(D): O NEG

## 2020-08-06 LAB — PREPARE RBC (CROSSMATCH)

## 2020-08-06 MED ORDER — MAGNESIUM SULFATE 4 GM/100ML IV SOLN
4.0000 g | Freq: Once | INTRAVENOUS | Status: AC
  Administered 2020-08-06: 4 g via INTRAVENOUS
  Filled 2020-08-06: qty 100

## 2020-08-06 MED ORDER — HYDROCORTISONE NA SUCCINATE PF 100 MG IJ SOLR
25.0000 mg | Freq: Two times a day (BID) | INTRAMUSCULAR | Status: DC
  Administered 2020-08-06: 25 mg via INTRAVENOUS
  Filled 2020-08-06: qty 2

## 2020-08-06 MED ORDER — SODIUM CHLORIDE 0.9% IV SOLUTION: Freq: Once | INTRAVENOUS | Status: AC

## 2020-08-06 MED ORDER — ENOXAPARIN SODIUM 60 MG/0.6ML ~~LOC~~ SOLN
60.0000 mg | Freq: Two times a day (BID) | SUBCUTANEOUS | Status: DC
  Administered 2020-08-06 – 2020-08-07 (×2): 60 mg via SUBCUTANEOUS
  Filled 2020-08-06 (×2): qty 0.6

## 2020-08-06 MED ORDER — HYDROCORTISONE NA SUCCINATE PF 100 MG IJ SOLR
25.0000 mg | Freq: Every day | INTRAMUSCULAR | Status: DC
  Administered 2020-08-07 – 2020-08-09 (×3): 25 mg via INTRAVENOUS
  Filled 2020-08-06 (×3): qty 2

## 2020-08-06 MED ORDER — SODIUM CHLORIDE 0.9 % IV SOLN: INTRAVENOUS | Status: DC

## 2020-08-06 NOTE — Progress Notes (Signed)
Second unit of blood finished transfusing.   STOP TIME: 08/06/2020 @ 1715  VITALS REMAIN STABLE ALL CHARTED IN BLOOD ADMIN SECTION OF FLOWSHEETS.

## 2020-08-06 NOTE — Progress Notes (Signed)
PROGRESS NOTE   [name]  PFX:[id number] DOB: 07-17-1923 DOA: 08/05/2020 PCP: [name], NP   Chief Complaint  Patient presents with  . Code Sepsis   Level of care: Stepdown  Brief Admission History:  87 y.o. male with history of peripheral artery disease, metastatic cancer, GERD, dementia, atrial fibrillation, diabetes mellitus type 2, and more presents the ED with a chief complaint of cough and fever.  Patient is confused and not able to provide any history at all.  Daughter at bedside reports that patient has not been feeling well for the past 2 days.  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  They have been treating him since November and expecting autoamputation. Pt has had increasing weakness, increasing difficulty with swallow function, esophageal structure and admitted to [facility] with fever, sepsis, aspiration pneumonia, dysphagia, cellulitis around gangrenous right toe.   Assessment & Plan:   Principal Problem:   Sepsis ([place]) Active Problems:   ATRIAL FIBRILLATION   Dysphagia   Type 2 diabetes mellitus (HCC)   Gangrene of toe of right foot (HCC)   Protein-calorie malnutrition, severe  Sepsis secondary to aspiration pneumonia - sepsis physiology resolving with supportive measures and IV antibiotics.  Follow cultures. With his known severe PVD I would be hesitant to start pressor therapy - fortunately his blood pressures have been improving with therapy - I have been weaning down the stress dose steroid started at admission   Acute metabolic encephalopathy - improving with therapy  Normocytic Anemia -there is no evidence of active bleeding - suspect hemodilution from sepsis resuscitation - transfuse 2 units PRBC  Hypokalemia / Hypomagnesemia - IV replacement, follow  Metabolic acidosis  - treating supportively   DNR present on admission - appreciate palliative consult for goals of  care  Dry gangrene right great toe - appreciate surgery consult - daily betadine swabs and local care  Severe protein calorie malnutrition - appreciate dietitian consultation and recommendations  Atrial fibrillation  - apixaban for full anticoagulation started - HR remains controlled on diltiazem  DVT prophylaxis: apixaban  Code Status: DNR  Family Communication: daughter at bedside updated  Disposition: TBD Status is: Inpatient  Remains inpatient appropriate because:IV treatments appropriate due to intensity of illness or inability to take PO and Inpatient level of care appropriate due to severity of illness   Dispo: The patient is from: Home              Anticipated d/c is to: TBD              Patient currently is not medically stable to d/c.   Difficult to place patient No   Consultants:   GI  Palliative care  surgery  Procedures:   n/a  Antimicrobials:  Cefepime 4/17>> Vancomycin 4/17-4/18 Metronidazole 4/18>>    Subjective: Pt more alert and awake and eating and drinking with assistance of daughter   Objective: Vitals:   08/06/20 0718 08/06/20 0800 08/06/20 1030 08/06/20 1110  BP:  (!) 109/52 (!) 104/43   Pulse: 80 82 64 (!) 55  Resp: (!) 21 16 20 19   Temp: 98.2 F (36.8 C)  98 F (36.7 C) (!) 97.3 F (36.3 C)  TempSrc: Oral  Oral Oral  SpO2: 100% 100% 100% 100%  Weight:      Height:        Intake/Output Summary (Last 24 hours) at 08/06/2020 1121 Last data  filed at 08/06/2020 1031 Gross per 24 hour  Intake 3447.35 ml  Output 210 ml  Net 3237.35 ml   Filed Weights   08/05/20 0029 08/05/20 0437  Weight: 60.8 kg 58.5 kg    Examination:  General exam: hard of hearing, frail, elderly, alert, Appears calm and comfortable  Respiratory system: Clear to auscultation. Respiratory effort normal. Cardiovascular system: normal S1 & S2 heard. No JVD, murmurs, rubs, gallops or clicks. No pedal edema. Gastrointestinal system: Abdomen is nondistended,  soft and nontender. No organomegaly or masses felt. Normal bowel sounds heard. Central nervous system: Alert and oriented. No focal neurological deficits. Extremities: dry gangrene right 1st toe. Skin: erythema around right 1st toe.  Psychiatry: Judgement and insight appear poor. Mood & affect appropriate.   Data Reviewed: I have personally reviewed following labs and imaging studies  CBC: Recent Labs  Lab 08/05/20 0015 08/05/20 0445 08/06/20 0414  WBC 19.3* 20.6* 10.1  NEUTROABS 15.2* 16.2* 8.6*  HGB 8.5* 7.3* 5.8*  HCT 25.5* 22.4* 18.3*  MCV 84.4 85.5 85.9  PLT 378 261 712    Basic Metabolic Panel: Recent Labs  Lab 07/30/20 1436 08/05/20 0015 08/05/20 0445 08/06/20 0414  NA 140 138 137 140  K 4.6 4.0 4.1 4.6  CL 111 112* 113* 114*  CO2 17* 15* 17* 17*  GLUCOSE 108* 188* 164* 155*  BUN 33* 27* 25* 30*  CREATININE 1.20 1.13 1.00 0.97  CALCIUM 8.7* 8.3* 7.6* 7.9*  MG  --   --  0.5* 1.4*    GFR: Estimated Creatinine Clearance: 38.5 mL/min (by C-G formula based on SCr of 0.97 mg/dL).  Liver Function Tests: Recent Labs  Lab 07/30/20 1436 08/05/20 0015 08/05/20 0445 08/06/20 0414  AST 13* 11* 12* 9*  ALT 9 9 8 7   ALKPHOS 77 81 64 54  BILITOT 0.3 0.6 0.5 0.6  PROT 7.0 6.8 5.6* 5.2*  ALBUMIN 3.0* 2.9* 2.4* 2.2*    CBG: Recent Labs  Lab 08/05/20 1737 08/05/20 2107 08/06/20 0719 08/06/20 1108  GLUCAP 110* 141* 151* 243*    Recent Results (from the past 240 hour(s))  Resp Panel by RT-PCR (Flu A&B, Covid) Nasopharyngeal Swab     Status: None   Collection Time: 08/05/20 12:15 AM   Specimen: Nasopharyngeal Swab; Nasopharyngeal(NP) swabs in vial transport medium  Result Value Ref Range Status   SARS Coronavirus 2 by RT PCR NEGATIVE NEGATIVE Final    Comment: (NOTE) SARS-CoV-2 target nucleic acids are NOT DETECTED.  The SARS-CoV-2 RNA is generally detectable in upper respiratory specimens during the acute phase of infection. The lowest concentration of  SARS-CoV-2 viral copies this assay can detect is 138 copies/mL. A negative result does not preclude SARS-Cov-2 infection and should not be used as the sole basis for treatment or other patient management decisions. A negative result may occur with  improper specimen collection/handling, submission of specimen other than nasopharyngeal swab, presence of viral mutation(s) within the areas targeted by this assay, and inadequate number of viral copies(<138 copies/mL). A negative result must be combined with clinical observations, patient history, and epidemiological information. The expected result is Negative.  Fact Sheet for Patients:  EntrepreneurPulse.com.au  Fact Sheet for Healthcare Providers:  IncredibleEmployment.be  This test is no t yet approved or cleared by the [place] FDA and  has been authorized for detection and/or diagnosis of SARS-CoV-2 by FDA under an Emergency Use Authorization (EUA). This EUA will remain  in effect (meaning this test can be used) for the  duration of the COVID-19 declaration under Section 564(b)(1) of the Act, 21 U.S.C.section 360bbb-3(b)(1), unless the authorization is terminated  or revoked sooner.       Influenza A by PCR NEGATIVE NEGATIVE Final   Influenza B by PCR NEGATIVE NEGATIVE Final    Comment: (NOTE) The Xpert Xpress SARS-CoV-2/FLU/RSV plus assay is intended as an aid in the diagnosis of influenza from Nasopharyngeal swab specimens and should not be used as a sole basis for treatment. Nasal washings and aspirates are unacceptable for Xpert Xpress SARS-CoV-2/FLU/RSV testing.  Fact Sheet for Patients: EntrepreneurPulse.com.au  Fact Sheet for Healthcare Providers: IncredibleEmployment.be  This test is not yet approved or cleared by the [place] FDA and has been authorized for detection and/or diagnosis of SARS-CoV-2 by FDA under an Emergency Use  Authorization (EUA). This EUA will remain in effect (meaning this test can be used) for the duration of the COVID-19 declaration under Section 564(b)(1) of the Act, 21 U.S.C. section 360bbb-3(b)(1), unless the authorization is terminated or revoked.  Performed at [facility], [address] [place], [place] [address]   Blood Culture (routine x 2)     Status: None (Preliminary result)   Collection Time: 08/05/20 12:15 AM   Specimen: BLOOD RIGHT FOREARM  Result Value Ref Range Status   Specimen Description BLOOD RIGHT FOREARM  Final   Special Requests   Final    BOTTLES DRAWN AEROBIC AND ANAEROBIC Blood Culture adequate volume   Culture   Final    NO GROWTH 1 DAY Performed at [facility], [address], [place], [place] [address]    Report Status PENDING  Incomplete  Blood Culture (routine x 2)     Status: None (Preliminary result)   Collection Time: 08/05/20 12:20 AM   Specimen: BLOOD LEFT FOREARM  Result Value Ref Range Status   Specimen Description BLOOD LEFT FOREARM  Final   Special Requests   Final    BOTTLES DRAWN AEROBIC AND ANAEROBIC Blood Culture adequate volume   Culture   Final    NO GROWTH 1 DAY Performed at [facility], [address] [place], [place] [address]    Report Status PENDING  Incomplete  MRSA PCR Screening     Status: None   Collection Time: 08/05/20  4:13 AM   Specimen: Nasal Mucosa; Nasopharyngeal  Result Value Ref Range Status   MRSA by PCR NEGATIVE NEGATIVE Final    Comment:        The GeneXpert MRSA Assay (FDA approved for NASAL specimens only), is one component of a comprehensive MRSA colonization surveillance program. It is not intended to diagnose MRSA infection nor to guide or monitor treatment for MRSA infections. Performed at [facility], [address] [place],  [address]      Radiology Studies: [facility] Chest [facility] 1 View  Result Date: 08/05/2020 CLINICAL DATA:  Questionable sepsis EXAM: PORTABLE CHEST 1 VIEW COMPARISON:   None. FINDINGS: Cardiomegaly. Left lower lobe airspace opacity, likely pneumonia. No confluent opacity on the right. No effusions. No acute bony abnormality. IMPRESSION: Left lower lobe airspace opacity concerning for pneumonia. Electronically Signed   By: Rolm Baptise M.D.   On: 08/05/2020 00:45   DG Foot Complete Right  Result Date: 08/05/2020 CLINICAL DATA:  Wound on right heel. EXAM: RIGHT FOOT COMPLETE - 3+ VIEW COMPARISON:  None. FINDINGS: Small plantar calcaneal spur. No bone destruction to suggest osteomyelitis. No acute bony abnormality. Specifically, no fracture, subluxation, or dislocation. Diffuse vascular calcifications. IMPRESSION: No acute bony abnormality.  Electronically Signed   By: Rolm Baptise M.D.   On: 08/05/2020 00:45    Scheduled Meds: . apixaban  5 mg Oral BID  . aspirin  81 mg Oral Daily  . Chlorhexidine Gluconate Cloth  6 each Topical Daily  . dextromethorphan-guaiFENesin  1 tablet Oral Daily  . diltiazem  30 mg Oral Q6H  . donepezil  10 mg Oral Daily  . hydrocortisone sod succinate (SOLU-CORTEF) inj  25 mg Intravenous Q12H  . insulin aspart  0-5 Units Subcutaneous QHS  . insulin aspart  0-9 Units Subcutaneous TID WC  . mouth rinse  15 mL Mouth Rinse BID  . mirtazapine  15 mg Oral QHS  . pantoprazole sodium  40 mg Per Tube Daily   Continuous Infusions: . ceFEPime (MAXIPIME) IV Stopped (08/05/20 2209)  . metronidazole 500 mg (08/06/20 0916)    LOS: 1 day   Time spent: 35 mins    Eulanda Dorion Wynetta Emery, MD How to contact the [facility] Attending or Consulting provider [place] or covering provider during after hours [place], for this patient?  1. Check the care team in [facility] and look for a) attending/consulting [facility] provider listed and b) the [facility] team listed 2. Log into [email or web address] and use [place]'s universal password to access. If you do not have the password, please contact the hospital operator. 3. Locate the [facility] provider you are looking for under [facility]  and page to a number that you can be directly reached. 4. If you still have difficulty reaching the provider, please page the [facility] (Director on Call) for the Hospitalists listed on amion for assistance.  08/06/2020, 11:21 AM

## 2020-08-06 NOTE — Consult Note (Addendum)
Progress Note   Subjective: Daughter at bedside who helps provide history. Patient is A&O x3.  Patient reports he is feeling improved today.  Shortness of breath has essentially resolved.  Tolerated pured diet well this morning.  This was confirmed by his daughter.  Daughter also states he seems much more alert today and is fairly close to his baseline.  Notably, hemoglobin down to 5.8 this morning.  No overt GI bleeding.  In fact, he has not had a bowel movement since admission.  Denies abdominal pain.  Objective: Vital signs in last 24 hours: Temp:  [97.3 F (36.3 C)-98.2 F (36.8 C)] 98 F (36.7 C) (04/19 1030) Pulse Rate:  [64-82] 64 (04/19 1030) Resp:  [16-29] 20 (04/19 1030) BP: (85-128)/(12-53) 104/43 (04/19 1030) SpO2:  [100 %] 100 % (04/19 1030) Last BM Date: 08/04/20 General:   Alert, pleasant, elderly male who appears frail and cachectic. Head:  Normocephalic and atraumatic. Heart: Irregularly irregular with systolic murmur.  No tachycardia. Lungs: Clear to auscultation bilaterally, without wheezing, rales, or rhonchi.  Abdomen:  Bowel sounds present, soft, non-tender, non-distended. No HSM or hernias noted. No rebound or guarding. No masses appreciated  Extremities:  Without edema. Neurologic:  Alert and  oriented x3.  Psych:  Normal mood and affect.  Intake/Output from previous day: 04/18 0701 - 04/19 0700 In: 2700.6 [I.V.:2282.2; IV Piggyback:418.4] Out: 210 [Urine:210] Intake/Output this shift: Total I/O In: 181.8 [IV Piggyback:181.8] Out: -   Lab Results: Recent Labs    08/05/20 0015 08/05/20 0445 08/06/20 0414  WBC 19.3* 20.6* 10.1  HGB 8.5* 7.3* 5.8*  HCT 25.5* 22.4* 18.3*  PLT 378 261 266   BMET Recent Labs    08/05/20 0015 08/05/20 0445 08/06/20 0414  NA 138 137 140  K 4.0 4.1 4.6  CL 112* 113* 114*  CO2 15* 17* 17*  GLUCOSE 188* 164* 155*  BUN 27* 25* 30*  CREATININE 1.13 1.00 0.97  CALCIUM 8.3* 7.6* 7.9*   LFT Recent Labs     08/05/20 0015 08/05/20 0445 08/06/20 0414  PROT 6.8 5.6* 5.2*  ALBUMIN 2.9* 2.4* 2.2*  AST 11* 12* 9*  ALT 9 8 7   ALKPHOS 81 64 54  BILITOT 0.6 0.5 0.6   PT/INR Recent Labs    08/05/20 0015 08/05/20 0445  LABPROT 23.5* 23.5*  INR 2.1* 2.1*    Studies/Results: DG Chest Port 1 View  Result Date: 08/05/2020 CLINICAL DATA:  Questionable sepsis EXAM: PORTABLE CHEST 1 VIEW COMPARISON:  None. FINDINGS: Cardiomegaly. Left lower lobe airspace opacity, likely pneumonia. No confluent opacity on the right. No effusions. No acute bony abnormality. IMPRESSION: Left lower lobe airspace opacity concerning for pneumonia. Electronically Signed   By: Rolm Baptise M.D.   On: 08/05/2020 00:45   DG Foot Complete Right  Result Date: 08/05/2020 CLINICAL DATA:  Wound on right heel. EXAM: RIGHT FOOT COMPLETE - 3+ VIEW COMPARISON:  None. FINDINGS: Small plantar calcaneal spur. No bone destruction to suggest osteomyelitis. No acute bony abnormality. Specifically, no fracture, subluxation, or dislocation. Diffuse vascular calcifications. IMPRESSION: No acute bony abnormality. Electronically Signed   By: Rolm Baptise M.D.   On: 08/05/2020 00:45    Assessment: 87 y.o. 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 Delton Coombes, history of dysphagia with dilations in the past and most recently  March 2020, presenting to the ED with sepsis secondary to pneumonia, dry gangrene of right great toe, afib with RVR, acute metabolic encephalopathy in setting of acute illness, and GI consulted due to history of dysphagia and esophageal stricture.   Dysphagia: Known prior dilations with last in March 2020. Known esophageal dysmotility and concern for achalasia. Recent BPE April 2022 with subjective narrowing of distal  esophagus, 12.5 mm tablet lodged in distal esophageal diverticulum, retaining a large amount of contrast with subsequent reflux of contrast to level of aortic arch. Also second developing small distal esophageal diverticulum. Laryngeal penetration without aspiration although vallecular, piriform sinus, and oral residuals noted.  EGD +/- dilation and possible Botox injection had been planned as outpatient for May 2022. Notably, he has been only eating pudding, ice cream consistencies since March 2022 after presentation to the ED at [facility] for choking episode; he received Glucagon, Reglan, Benadryl and was discharged with improvement. Discussion with daughter yesterday who desires endoscopy once patient is stabilized.   SLP evaluated patient yesterday and recommended D1/puree diet with thin liquids, po medications whole or crushed as able in puree and follow with liquid wash. Palliative also saw patient yesterday. Plan to treat the treatable but no CPR or intubation, no amputation, agreeable to EGD.   This morning patient tolerated a puree diet well. Regarding sepsis and AMS, patient has had quite a bit of improvement between yesterday and today.  He is alert and oriented x3. Denies SOB. Tolerating a puree diet today. HR controlled. BP remains soft. I suspect may be ready for EGD in the next couple of days. He is on Eliquis and this would need to be held 2 days prior to EGD/dilation. May need to consider bridging.  Will discuss further with Dr. Jenetta Downer regarding timing of EGD and holding Eliquis.  Anemia: Chronic with hemoglobin in the 9-11 range outpatient. Hemoglobin 8.5 on admission, down to 5.8 this morning.  No overt GI bleeding.  Suspect this decline is likely secondary to significant IV fluid hydration in the setting of acute illness with sepsis. Will go ahead and check iron studies in the am.  2 units PRBCs have been ordered.  Continue to monitor.  Chronic pancreatitis: Pancreatic calcification and  cystic changes seen on prior CTs. Cystic lesion of pancreatic tail in Aug 2019 on CT. Reports chronic loose stools with 1-2 BMs daily. May benefit from pancreatic enzymes in the future once swallowing issue is addressed.    Plan: 1.  As patient is improving clinically, suspect he may be a candidate for EGD/dilation  +/- Botox in the next couple of days.  Will discuss with Dr. Jenetta Downer. He remains on Eliquis.  Would need to stop Eliquis x2 days prior to EGD.  May need to consider bridging.  Will discuss with Dr. Jenetta Downer.  2.  Continue pured diet with thin liquids per speech recommendations.  3.  2 units PRBCs ordered today per hospitalist.  4.  Check iron studies with morning labs.  5.  Monitor for overt GI bleeding.  6.  Continue to monitor H/H.  7.  Continue PPI daily.  8.  GI will continue to follow with you to assess candidacy for EGD.    Addendum: Spoke with Dr. Harl Bowie regarding holding Eliquis and question of bridging therapy.  Dr. Harl Bowie states patient may hold Eliquis x2 days prior to EGD with no bridging.    LOS: 1 day    08/06/2020, 10:31 AM   Aliene Altes, PA-[facility]

## 2020-08-06 NOTE — Progress Notes (Signed)
Palliative: [name] is lying quietly in bed.  He appears acutely/chronically ill, pale and frail.  He greets me as I enter, making and keeping eye contact.  Although he is hard of hearing, he is oriented and able to make his basic needs known.  His daughter [name] is not present at this time.  [name] shares that she is gone to the restroom.  We talked about his illness.  He tells me that he is feeling better.  He is able to independently tell me that he will receive blood today.  We talked about his decreased white blood cells.  I share that I think he will need to be in the hospital at least a few more days.  [name] seems accepting of this, not fearful.  Conference with attending, bedside nursing staff, transition of care team related to patient condition, needs, goals of care.  Plan: Continue to treat the treatable but no CPR or intubation.  Transfuse PRBC today.  May be a candidate for EGD/Botox in the next few days per GI.  25 minutes Quinn Axe, NP Palliative medicine team Team phone [phone] Greater than 50% of this time was spent counseling and coordinating care related to the above assessment and plan.

## 2020-08-06 NOTE — Progress Notes (Addendum)
Blood unable to be charted into blood admin flowsheet.  Unit #: [id number] L Component: RED CELLS, LR ABO/Rh: O-NEG  PATIENT ABO/Rh: O-NEG OFH:[id number] BB Armband #: [id number]  Blood started 08/06/2020 @ 1452 [place], RN   *VITALS TO BE CHARTED IN BLOOD ADMIN FLOWSHEET*

## 2020-08-06 NOTE — Progress Notes (Signed)
ANTICOAGULATION CONSULT NOTE - Initial Consult  Pharmacy Consult for Enoxaparin Indication: atrial fibrillation  Allergies  Allergen Reactions  . Tape Other (See Comments)    SKIN IS VERY THIN AND WILL TEAR AND BRUISE VERY EASILY!!!!  . Augmentin [Amoxicillin-Pot Clavulanate] Rash and Other (See Comments)    Redness of skin    Patient Measurements: Height: 5' 10"  (177.8 cm) Weight: 58.5 kg (128 lb 15.5 oz) IBW/kg (Calculated) : 73  Vital Signs: Temp: 97.7 F (36.5 C) (04/19 1715) Temp Source: Oral (04/19 1715) BP: 116/58 (04/19 1715) Pulse Rate: 29 (04/19 1715)  Labs: Recent Labs    08/05/20 0015 08/05/20 0445 08/05/20 0801 08/06/20 0414  HGB 8.5* 7.3*  --  5.8*  HCT 25.5* 22.4*  --  18.3*  PLT 378 261  --  266  APTT 41*  --   --   --   LABPROT 23.5* 23.5*  --   --   INR 2.1* 2.1*  --   --   CREATININE 1.13 1.00  --  0.97  TROPONINIHS  --  32* 29*  --     Estimated Creatinine Clearance: 38.5 mL/min (by C-G formula based on SCr of 0.97 mg/dL).   Medical History: Past Medical History:  Diagnosis Date  . Acute bronchitis   . Acute lower UTI 11/25/2017  . Amputation of toe of left foot ([place]) 07/30/2017, 08/27/2017    Great toe 07/30/2017 Second toe 08/27/2017   . Arthritis   . Aspiration pneumonia ([place]) 01/31/2018  . Atrial fibrillation ([place])    Dr. Harl Bowie- [place] follows saw 11'14  . Bladder stones    tx. with oral meds and antibiotics, now surgery planned  . Bone metastases ([place]) 08/21/2015  . Cataracts, both eyes    surgery planned May 2015  . Community acquired pneumonia 04/16/2018  . Cystoid macular edema 04/21/2015    Right eye 04/21/2015   . Dehydration 04/16/2018  . Dementia without behavioral disturbance ([place]) 04/16/2018  . Diabetes mellitus without complication ([place])    Type II  . Diarrhea 11/25/2017  . Dyspnea    with activity  . Dysrhythmia    Afib  . Family history of breast cancer   . Family history of colon cancer   . Genetic  testing 02/09/2017  . GERD (gastroesophageal reflux disease)   . 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 Margot Chimes, thus far without recurrence.   Marland Kitchen History of MRI of lumbar spine 03/05/2014  . Hypertension   . Hypertrophy of prostate with urinary obstruction   . Hypomagnesemia 03/16/2017  . Metabolic encephalopathy 25/00/3704  . Metastatic adenocarcinoma to prostate ([place]) 09/08/2013  . 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 NHL (non-Hodgkin's lymphoma) ([place]) 2005   Diffuse large B-cell lymphoma, clinically stage IIIA, CD20 positive, status post cervical lymph node biopsy 08/03/2003 on the left. PET scan was also positive in the spleen and small bowel region, but bone marrow aspiration and biopsy were negative. So he essentially had stage IIIAs. He received R-CHOP x6 cycles with CR established by PET scan criteria on 11/23/2003 with no evidence for relapse th  . Numbness 02/19/2017   Per [place] new patient packet  . Osteomyelitis ([place]) 06/29/2017   toe of left foot   . PAD (peripheral artery disease) ([place])  08/27/2017  . Polyneuropathy 02/19/2017  . Postural dizziness with presyncope 02/19/2017  . S/P TURP 08/10/2013  . Sepsis ([place]) 07/02/2018  . Skin cancer    Basal cell- face,head, neck,  arms, legs, Back  . Urinary frequency 02/19/2017    Medications:  Facility-Administered Medications Prior to Admission  Medication Dose Route Frequency Provider Last Rate Last Admin  . botulinum toxin Type A (BOTOX) injection 100 Units  100 Units Intramuscular Once Montez Morita, Quillian Quince, MD       Medications Prior to Admission  Medication Sig Dispense Refill Last Dose  . acetaminophen (TYLENOL) 500 MG tablet Take 325-500 mg by mouth See admin  instructions. 315m in AM and 5076mPRN QHS   08/04/2020 at Unknown time  . albuterol (2.5 MG/3ML) 0.083% NEBU 3 mL, albuterol (5 MG/ML) 0.5% NEBU 0.5 mL Inhale 2.5 mg into the lungs 3 (three) times daily as needed (shortness of breath).   Past Month at Unknown time  . apixaban (ELIQUIS) 5 MG TABS tablet Take 1 tablet (5 mg total) by mouth 2 (two) times daily. 180 tablet 3 08/04/2020 at 1500  . Aspirin 81 MG CAPS Take 81 mg by mouth daily.    08/04/2020 at Unknown time  . Cholecalciferol (VITAMIN D3) 1000 units CAPS Take 1,000 Units by mouth daily.    08/04/2020 at Unknown time  . denosumab (XGEVA) 120 MG/1.7ML SOLN injection Inject 120 mg into the skin every 3 (three) months.     . diclofenac Sodium (VOLTAREN) 1 % GEL Apply 2 g topically as needed (pain).   Past Week at Unknown time  . diltiazem (CARDIZEM SR) 60 MG 12 hr capsule Take 1 capsule (60 mg total) by mouth 2 (two) times daily. 60 capsule 11 08/04/2020 at Unknown time  . donepezil (ARICEPT) 10 MG tablet Take 1 tablet by mouth once daily (Patient taking differently: Take 10 mg by mouth at bedtime.) 90 tablet 0 08/03/2020  . doxycycline (VIBRA-TABS) 100 MG tablet Take 1 tablet (100 mg total) by mouth 2 (two) times daily for 14 days. 20 tablet 0 08/04/2020 at Unknown time  . furosemide (LASIX) 20 MG tablet Take 0.5 tablets (10 mg total) by mouth daily. Take for 5 day, then change to as needed for shortness of breath for pleural effusion. (Patient taking differently: Take 10 mg by mouth daily as needed for fluid or edema.) 30 tablet 0 Past Month at Unknown time  . glipiZIDE (GLUCOTROL XL) 5 MG 24 hr tablet Take 1 tablet (5 mg total) by mouth daily with breakfast. 90 tablet 0 08/04/2020 at Unknown time  . lisinopril (ZESTRIL) 20 MG tablet Take 1 tablet by mouth once daily (Patient taking differently: Take 20 mg by mouth daily.) 90 tablet 3 08/04/2020 at Unknown time  . metFORMIN (GLUCOPHAGE) 850 MG tablet Take 1 tablet (850 mg total) by mouth 2 (two) times  daily with a meal. 180 tablet 0 08/04/2020 at Unknown time  . mirtazapine (REMERON) 15 MG tablet Take 15 mg by mouth at bedtime.   08/03/2020  . omeprazole (PRILOSEC) 40 MG capsule Take 1 capsule (40 mg total) by mouth daily. 30 capsule 3 08/04/2020 at Unknown time  . Probiotic Product (PROBIOTIC DAILY PO) Take 1 tablet by mouth daily. Every morning    08/04/2020 at Unknown time  . simethicone (MYLICON) 80 MG chewable tablet Chew 80 mg by mouth daily.   08/04/2020 at Unknown time  . blood glucose meter kit and supplies Dispense based on patient and insurance preference.  Use four times daily as directed. (FOR ICD-10 E10.9, E11.9). 1 each 0   . glucose blood (EASYMAX TEST) test strip Use as instructed 100 each 12   . Lancets 28G MISC 1 kit by Does not apply route every morning. 100 each 1     Assessment: PTA apixaban discontinued and Pharmacy consulted to dose enoxaparin for atrial fibrillation as bridge for possible procedure on 4/21 or 4/22.  Patient's current estimated creatinine clearance is greater than 30 ml/min.  Goal of Therapy:  Prevent stroke and systemic embolism Monitor platelets by anticoagulation protocol: Yes   Plan:  Lovenox 1 mg/kg subq every 12 hours Monitor daily CBC, s/s bleeding   Efraim Kaufmann, PharmD, BCPS 08/06/2020,6:25 PM

## 2020-08-07 LAB — URINE CULTURE: Culture: <10000 — AB

## 2020-08-07 LAB — IRON AND TIBC
Iron: 75 ug/dL (ref 45–182)
Saturation Ratios: 56 % — ABNORMAL HIGH (ref 17.9–39.5)
TIBC: 133 ug/dL — ABNORMAL LOW (ref 250–450)
UIBC: 58 ug/dL

## 2020-08-07 LAB — COMPREHENSIVE METABOLIC PANEL
ALT: 5 U/L (ref 0–44)
AST: 12 U/L — ABNORMAL LOW (ref 15–41)
Albumin: 2 g/dL — ABNORMAL LOW (ref 3.5–5.0)
Alkaline Phosphatase: 53 U/L (ref 38–126)
Anion gap: 9 (ref 5–15)
BUN: 31 mg/dL — ABNORMAL HIGH (ref 8–23)
CO2: 16 mmol/L — ABNORMAL LOW (ref 22–32)
Calcium: 7 mg/dL — ABNORMAL LOW (ref 8.9–10.3)
Chloride: 113 mmol/L — ABNORMAL HIGH (ref 98–111)
Creatinine, Ser: 0.96 mg/dL (ref 0.61–1.24)
GFR, Estimated: >60 mL/min (ref >60)
Glucose, Bld: 174 mg/dL — ABNORMAL HIGH (ref 70–99)
Potassium: 3.5 mmol/L (ref 3.5–5.1)
Sodium: 138 mmol/L (ref 135–145)
Total Bilirubin: 0.6 mg/dL (ref 0.3–1.2)
Total Protein: 4.8 g/dL — ABNORMAL LOW (ref 6.5–8.1)

## 2020-08-07 LAB — TYPE AND SCREEN
ABO/RH(D): O NEG
Antibody Screen: NEGATIVE
Unit division: 0
Unit division: 0

## 2020-08-07 LAB — C DIFFICILE QUICK SCREEN W PCR REFLEX
C Diff antigen: NEGATIVE
C Diff interpretation: NOT DETECTED
C Diff toxin: NEGATIVE

## 2020-08-07 LAB — CBC WITH DIFFERENTIAL/PLATELET
Abs Immature Granulocytes: 0.19 10*3/uL — ABNORMAL HIGH (ref 0.00–0.07)
Basophils Absolute: 0 10*3/uL (ref 0.0–0.1)
Basophils Relative: 0 %
Eosinophils Absolute: 0 10*3/uL (ref 0.0–0.5)
Eosinophils Relative: 0 %
HCT: 25.9 % — ABNORMAL LOW (ref 39.0–52.0)
Hemoglobin: 8.5 g/dL — ABNORMAL LOW (ref 13.0–17.0)
Immature Granulocytes: 2 %
Lymphocytes Relative: 7 %
Lymphs Abs: 0.6 10*3/uL — ABNORMAL LOW (ref 0.7–4.0)
MCH: 28.5 pg (ref 26.0–34.0)
MCHC: 32.8 g/dL (ref 30.0–36.0)
MCV: 86.9 fL (ref 80.0–100.0)
Monocytes Absolute: 0.9 10*3/uL (ref 0.1–1.0)
Monocytes Relative: 10 %
Neutro Abs: 7.2 10*3/uL (ref 1.7–7.7)
Neutrophils Relative %: 81 %
Platelets: 240 10*3/uL (ref 150–400)
RBC: 2.98 MIL/uL — ABNORMAL LOW (ref 4.22–5.81)
RDW: 14.4 % (ref 11.5–15.5)
WBC: 8.9 10*3/uL (ref 4.0–10.5)
nRBC: 0 % (ref 0.0–0.2)

## 2020-08-07 LAB — BPAM RBC
Blood Product Expiration Date: 202205142359
Blood Product Expiration Date: 202205142359
ISSUE DATE / TIME: 202204191022
ISSUE DATE / TIME: 202204191438
Unit Type and Rh: 9500
Unit Type and Rh: 9500

## 2020-08-07 LAB — FERRITIN: Ferritin: 171 ng/mL (ref 24–336)

## 2020-08-07 LAB — GLUCOSE, CAPILLARY
Glucose-Capillary: 178 mg/dL — ABNORMAL HIGH (ref 70–99)
Glucose-Capillary: 229 mg/dL — ABNORMAL HIGH (ref 70–99)
Glucose-Capillary: 203 mg/dL — ABNORMAL HIGH (ref 70–99)
Glucose-Capillary: 184 mg/dL — ABNORMAL HIGH (ref 70–99)

## 2020-08-07 LAB — MAGNESIUM: Magnesium: 2 mg/dL (ref 1.7–2.4)

## 2020-08-07 NOTE — Progress Notes (Signed)
PROGRESS NOTE   [name]  GEZ:[id number] DOB: Mar 09, 1924 DOA: 08/05/2020 PCP: [name], NP   Chief Complaint  Patient presents with  . Code Sepsis   Level of care: Stepdown  Brief Admission History:  87 y.o. male with history of peripheral artery disease, metastatic cancer, GERD, dementia, atrial fibrillation, diabetes mellitus type 2, and more presents the ED with a chief complaint of cough and fever.  Patient is confused and not able to provide any history at all.  Daughter at bedside reports that patient has not been feeling well for the past 2 days.  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  They have been treating him since November and expecting autoamputation. Pt has had increasing weakness, increasing difficulty with swallow function, esophageal structure and admitted to [facility] with fever, sepsis, aspiration pneumonia, dysphagia, cellulitis around gangrenous right toe.   Assessment & Plan:   Principal Problem:   Sepsis ([place]) Active Problems:   ATRIAL FIBRILLATION   Dysphagia   Type 2 diabetes mellitus (HCC)   Gangrene of toe of right foot (HCC)   Protein-calorie malnutrition, severe  A/p 1)Sepsis secondary to aspiration pneumonia -  -Continue cefepime and Flagyl -Wean down stress dose steroids--currently on Solu-Cortef 25 mg daily    2)Dysphagia--speech pathology eval appreciated, recommends  Dysphagia 1 (puree);Thin liquid -GI consult appreciated plan for EGD with dilatation on 08/08/2020, ?? Botox shots  3)DM2-A1c is 5.6 reflecting excellent diabetic control PTA,--hold metformin and glipizide Use Novolog/Humalog Sliding scale insulin with Accu-Cheks/Fingersticks as ordered   4)Severe protein calorie malnutrition---partly due to swallowing difficulties/dysphagia -Continue Remeron for appetite stimulation - appreciate dietitian consultation and recommendations  5)Atrial fibrillation  /PAD -Lovenox bridge to allow for EGD with dilatation, -May resume apixaban when okay with GI service - HR remains controlled on diltiazem  6)Dry gangrene right great toe - appreciate surgery consult -Anticipate auto amputation of right big toe - daily betadine swabs and local care  7) social/ethics--- remains DNR, palliative care consult appreciated -Patient and family request no limitations to treatment at this time  8)Normocytic Anemia -there is no evidence of active bleeding -Serum iron is not low, ferritin is not low -Patient received 2 units of PRBC -Hemoglobin 8.5 from a low of 5.8 -Check B12 and folate  9)Hypokalemia / Hypomagnesemia -Replace  DVT prophylaxis: apixaban  Code Status: DNR  Family Communication: daughter at bedside updated  Disposition: TBD Status is: Inpatient  Remains inpatient appropriate because:IV treatments appropriate due to intensity of illness or inability to take PO and Inpatient level of care appropriate due to severity of illness   Dispo: The patient is from: Home              Anticipated d/c is to: TBD              Patient currently is not medically stable to d/c.   Difficult to place patient No   Consultants:   GI  Palliative care  surgery  Procedures:   n/a  Antimicrobials:  Cefepime 4/17>> Vancomycin 4/17-4/18 Metronidazole 4/18>>    Subjective: Wife and daughter at bedside, questions answered Oral intake is not great  Objective: Vitals:   08/07/20 0700 08/07/20 0806 08/07/20 0900 08/07/20 1125  BP: (!) 167/92  (!) 152/74   Pulse: 79  73   Resp: 16  15   Temp:  (!) 96.7 F (35.9 C)    TempSrc:  Axillary  Oral  SpO2: 100%  100%   Weight:      Height:        Intake/Output Summary (Last 24 hours) at 08/07/2020 1158 Last data filed at 08/07/2020 0545 Gross per 24 hour  Intake 841.93 ml  Output 600 ml  Net 241.93 ml   Filed Weights   08/05/20 0029 08/05/20 0437  Weight: 60.8 kg 58.5 kg     Examination:  Physical Exam  Gen:- Awake Alert, no acute distress HEENT:- [place].AT, No sclera icterus, very HOH Neck-Supple Neck,No JVD,.  Lungs-diminished breath sounds, no wheezing CV- S1, S2 normal, irregular Abd-  +ve B.Sounds, Abd Soft, No tenderness,    Extremity/Skin:- No  edema, right great toe gangrenous (dry), left foot with missing great toe and second toe Psych-affect is appropriate, oriented x3 Neuro-generalized weakness, no new focal deficits, no tremors   Data Reviewed: I have personally reviewed following labs and imaging studies  CBC: Recent Labs  Lab 08/05/20 0015 08/05/20 0445 08/06/20 0414 08/06/20 2144 08/07/20 0409  WBC 19.3* 20.6* 10.1 12.2* 8.9  NEUTROABS 15.2* 16.2* 8.6*  --  7.2  HGB 8.5* 7.3* 5.8* 9.0* 8.5*  HCT 25.5* 22.4* 18.3* 27.2* 25.9*  MCV 84.4 85.5 85.9 86.6 86.9  PLT 378 261 266 275 194    Basic Metabolic Panel: Recent Labs  Lab 08/05/20 0015 08/05/20 0445 08/06/20 0414 08/07/20 0409  NA 138 137 140 138  K 4.0 4.1 4.6 3.5  CL 112* 113* 114* 113*  CO2 15* 17* 17* 16*  GLUCOSE 188* 164* 155* 174*  BUN 27* 25* 30* 31*  CREATININE 1.13 1.00 0.97 0.96  CALCIUM 8.3* 7.6* 7.9* 7.0*  MG  --  0.5* 1.4* 2.0    GFR: Estimated Creatinine Clearance: 38.9 mL/min (by C-G formula based on SCr of 0.96 mg/dL).  Liver Function Tests: Recent Labs  Lab 08/05/20 0015 08/05/20 0445 08/06/20 0414 08/07/20 0409  AST 11* 12* 9* 12*  ALT 9 8 7 5   ALKPHOS 81 64 54 53  BILITOT 0.6 0.5 0.6 0.6  PROT 6.8 5.6* 5.2* 4.8*  ALBUMIN 2.9* 2.4* 2.2* 2.0*    CBG: Recent Labs  Lab 08/06/20 0719 08/06/20 1108 08/06/20 1620 08/06/20 2114 08/07/20 0738  GLUCAP 151* 243* 328* 262* 178*    Recent Results (from the past 240 hour(s))  Resp Panel by RT-PCR (Flu A&B, Covid) Nasopharyngeal Swab     Status: None   Collection Time: 08/05/20 12:15 AM   Specimen: Nasopharyngeal Swab; Nasopharyngeal(NP) swabs in vial transport medium  Result Value  Ref Range Status   SARS Coronavirus 2 by RT PCR NEGATIVE NEGATIVE Final    Comment: (NOTE) SARS-CoV-2 target nucleic acids are NOT DETECTED.  The SARS-CoV-2 RNA is generally detectable in upper respiratory specimens during the acute phase of infection. The lowest concentration of SARS-CoV-2 viral copies this assay can detect is 138 copies/mL. A negative result does not preclude SARS-Cov-2 infection and should not be used as the sole basis for treatment or other patient management decisions. A negative result may occur with  improper specimen collection/handling, submission of specimen other than nasopharyngeal swab, presence of viral mutation(s) within the areas targeted by this assay, and inadequate number of viral copies(<138 copies/mL). A negative result must be combined with clinical observations, patient history, and epidemiological information. The expected result is Negative.  Fact Sheet for Patients:  EntrepreneurPulse.com.au  Fact Sheet for Healthcare Providers:  IncredibleEmployment.be  This test is no t yet approved or cleared by the  [place] Architectural technologist and  has been authorized for detection and/or diagnosis of SARS-CoV-2 by FDA under an Print production planner (EUA). This EUA will remain  in effect (meaning this test can be used) for the duration of the COVID-19 declaration under Section 564(b)(1) of the Act, 21 U.S.C.section 360bbb-3(b)(1), unless the authorization is terminated  or revoked sooner.       Influenza A by PCR NEGATIVE NEGATIVE Final   Influenza B by PCR NEGATIVE NEGATIVE Final    Comment: (NOTE) The Xpert Xpress SARS-CoV-2/FLU/RSV plus assay is intended as an aid in the diagnosis of influenza from Nasopharyngeal swab specimens and should not be used as a sole basis for treatment. Nasal washings and aspirates are unacceptable for Xpert Xpress SARS-CoV-2/FLU/RSV testing.  Fact Sheet for  Patients: EntrepreneurPulse.com.au  Fact Sheet for Healthcare Providers: IncredibleEmployment.be  This test is not yet approved or cleared by the [place] FDA and has been authorized for detection and/or diagnosis of SARS-CoV-2 by FDA under an Emergency Use Authorization (EUA). This EUA will remain in effect (meaning this test can be used) for the duration of the COVID-19 declaration under Section 564(b)(1) of the Act, 21 U.S.C. section 360bbb-3(b)(1), unless the authorization is terminated or revoked.  Performed at [facility], [address] [place], [place] [address]   Blood Culture (routine x 2)     Status: None (Preliminary result)   Collection Time: 08/05/20 12:15 AM   Specimen: BLOOD RIGHT FOREARM  Result Value Ref Range Status   Specimen Description BLOOD RIGHT FOREARM  Final   Special Requests   Final    BOTTLES DRAWN AEROBIC AND ANAEROBIC Blood Culture adequate volume   Culture   Final    NO GROWTH 2 DAYS Performed at [facility], [address], [place], [place] [address]    Report Status PENDING  Incomplete  Blood Culture (routine x 2)     Status: None (Preliminary result)   Collection Time: 08/05/20 12:20 AM   Specimen: BLOOD LEFT FOREARM  Result Value Ref Range Status   Specimen Description BLOOD LEFT FOREARM  Final   Special Requests   Final    BOTTLES DRAWN AEROBIC AND ANAEROBIC Blood Culture adequate volume   Culture   Final    NO GROWTH 2 DAYS Performed at [facility], [address], [place], [place] [address]    Report Status PENDING  Incomplete  MRSA PCR Screening     Status: None   Collection Time: 08/05/20  4:13 AM   Specimen: Nasal Mucosa; Nasopharyngeal  Result Value Ref Range Status   MRSA by PCR NEGATIVE NEGATIVE Final    Comment:        The GeneXpert MRSA Assay (FDA approved for NASAL specimens only), is one component of a comprehensive MRSA colonization surveillance program. It is not intended to  diagnose MRSA infection nor to guide or monitor treatment for MRSA infections. Performed at [facility], [address], [place], [place] [address]   Urine culture     Status: Abnormal   Collection Time: 08/05/20  3:30 PM   Specimen: Urine, Random  Result Value Ref Range Status   Specimen Description   Final    URINE, RANDOM Performed at [facility], [address] [place], [place] [address]    Special Requests   Final    NONE Performed at [address] [place], [place] [address]    Culture (A)  Final    <10,000 COLONIES/mL INSIGNIFICANT GROWTH Performed at [facility], [place] [address], [place], [place] [address]    Report Status 08/07/2020 FINAL  Final  C Difficile Quick Screen w PCR reflex     Status: None   Collection Time: 08/06/20 10:40 PM   Specimen: Stool  Result Value Ref Range Status   C Diff antigen NEGATIVE NEGATIVE Final   C Diff toxin NEGATIVE NEGATIVE Final   C Diff interpretation No C. difficile detected.  Final    Comment: Performed at [facility], [address], [place], [place] [address]     Radiology Studies: No results found.  Scheduled Meds: . aspirin  81 mg Oral Daily  . Chlorhexidine Gluconate Cloth  6 each Topical Daily  . dextromethorphan-guaiFENesin  1 tablet Oral Daily  . diltiazem  30 mg Oral Q6H  . donepezil  10 mg Oral Daily  . enoxaparin (LOVENOX) injection  60 mg Subcutaneous Q12H  . hydrocortisone sod succinate (SOLU-CORTEF) inj  25 mg Intravenous Daily  . insulin aspart  0-5 Units Subcutaneous QHS  . insulin aspart  0-9 Units Subcutaneous TID WC  . mouth rinse  15 mL Mouth Rinse BID  . mirtazapine  15 mg Oral QHS  . pantoprazole sodium  40 mg Per Tube Daily   Continuous Infusions: . ceFEPime (MAXIPIME) IV Stopped (08/06/20 2153)  . metronidazole 500 mg (08/07/20 0923)    LOS: 2 days   Roxan Hockey, MD How to contact the [facility] Attending or Consulting provider [place] or covering provider during after  hours [place], for this patient?  1. Check the care team in [facility] and look for a) attending/consulting TR[facility] provider listed and b) the [facility] team listed 2. Log into [email or web address] and use [place]'s universal password to access. If you do not have the password, please contact the hospital operator. 3. Locate the [facility] provider you are looking for under [facility] and page to a number that you can be directly reached. 4. If you still have difficulty reaching the provider, please page the [facility] (Director on Call) for the Hospitalists listed on amion for assistance.  08/07/2020, 11:58 AM

## 2020-08-07 NOTE — Progress Notes (Signed)
Palliative: [name] is lying quietly in bed.  He continues to appear acutely/chronically ill and frail.  His daughter, [name], and his wife are at bedside.  It seems that his sepsis secondary to aspiration pneumonia is improving.  He has been evaluated by speech therapy who recommends dysphagia 1 pured diet with thin liquids.  GI is following with the goal of EGD and esophageal dilation, hopefully during this hospital stay.  [name] had blood transfusion yesterday, his hemoglobin has increased to 9.0, but this morning had decreased to 8.5.  This was discussed with family.  We talked about disposition.  Family states that at this point their preference is to return home with home health PT.  [name] is already active with outpatient palliative services with [facility] with attending, bedside nursing staff, transition of care team related to patient condition, needs, goals of care, disposition.  Plan: At this point continue to treat the treatable but no CPR or intubation.  Hopeful for inpatient EGD for esophageal dilation.  Anticipate return home.  25 minutes  Quinn Axe, NP Palliative medicine team Team phone [phone] Greater than 50% of this time was spent counseling and coordinating care related to the above assessment and plan.

## 2020-08-07 NOTE — Progress Notes (Addendum)
Subjective:  Patient watching baseball. Hard of hearing. No complaints. Per nursing he is taking dysphagia 1 diet with thin liquids per SLP. Has done ok today but yesterday had coughing spell with eating. Aspiration precautions being followed.  Objective: Vital signs in last 24 hours: Temp:  [96.7 F (35.9 C)-97.7 F (36.5 C)] 96.7 F (35.9 C) (04/20 0806) Pulse Rate:  [28-88] 73 (04/20 0900) Resp:  [14-21] 15 (04/20 0900) BP: (96-168)/(37-107) 152/74 (04/20 0900) SpO2:  [90 %-100 %] 100 % (04/20 0900) Last BM Date: 08/07/20 General:   Alert,  Elderly frail male in NAD. Pleasant and cooperative. Hard of hearing. Head:  Normocephalic and atraumatic. Eyes:  Sclera clear, no icterus.  Chest: CTA bilaterally without rales, rhonchi, crackles.    Heart:  Regular rate and irregular rhythm. Abdomen:  Soft, nontender and nondistended. Normal bowel sounds, without guarding, and without rebound.   Extremities:  Without clubbing. Dry gangrene right great toe. Psych:  Alert and cooperative. Normal mood and affect.  Intake/Output from previous day: 04/19 0701 - 04/20 0700 In: 1588.7 [I.V.:250; Blood:657; IV Piggyback:681.7] Out: 600 [Urine:600] Intake/Output this shift: No intake/output data recorded.  Lab Results: CBC Recent Labs    08/06/20 0414 08/06/20 2144 08/07/20 0409  WBC 10.1 12.2* 8.9  HGB 5.8* 9.0* 8.5*  HCT 18.3* 27.2* 25.9*  MCV 85.9 86.6 86.9  PLT 266 275 240   BMET Recent Labs    08/05/20 0445 08/06/20 0414 08/07/20 0409  NA 137 140 138  K 4.1 4.6 3.5  CL 113* 114* 113*  CO2 17* 17* 16*  GLUCOSE 164* 155* 174*  BUN 25* 30* 31*  CREATININE 1.00 0.97 0.96  CALCIUM 7.6* 7.9* 7.0*   LFTs Recent Labs    08/05/20 0445 08/06/20 0414 08/07/20 0409  BILITOT 0.5 0.6 0.6  ALKPHOS 64 54 53  AST 12* 9* 12*  ALT 8 7 5   PROT 5.6* 5.2* 4.8*  ALBUMIN 2.4* 2.2* 2.0*   No results for input(s): LIPASE in the last 72 hours. PT/INR Recent Labs    08/05/20 0015  08/05/20 0445  LABPROT 23.5* 23.5*  INR 2.1* 2.1*      Lab Results  Component Value Date   IRON 75 08/07/2020   TIBC 133 (L) 08/07/2020   FERRITIN 171 08/07/2020      Imaging Studies: DG Chest Port 1 View  Result Date: 08/05/2020 CLINICAL DATA:  Questionable sepsis EXAM: PORTABLE CHEST 1 VIEW COMPARISON:  None. FINDINGS: Cardiomegaly. Left lower lobe airspace opacity, likely pneumonia. No confluent opacity on the right. No effusions. No acute bony abnormality. IMPRESSION: Left lower lobe airspace opacity concerning for pneumonia. Electronically Signed   By: Rolm Baptise M.D.   On: 08/05/2020 00:45   DG Foot 2 Views Right  Result Date: 08/02/2020 CLINICAL DATA:  Cellulitis of right foot. History of osteomyelitis. Amputation of left great toe in April 2019. Second left great toe amputation on 08/27/2017. EXAM: RIGHT FOOT - 2 VIEW COMPARISON:  Right foot 02/22/2020 FINDINGS: No cortical erosion or destruction. No evidence of fracture or dislocation. No evidence of severe arthropathy. No aggressive appearing focal bone abnormality. No subcutaneus soft tissue edema.  Vascular calcifications. IMPRESSION: Subcutaneus soft tissue edema with no radiographic findings to suggest osteomyelitis. Electronically Signed   By: Iven Finn M.D.   On: 08/02/2020 17:12   DG Foot Complete Right  Result Date: 08/05/2020 CLINICAL DATA:  Wound on right heel. EXAM: RIGHT FOOT COMPLETE - 3+ VIEW COMPARISON:  None. FINDINGS: Small plantar  calcaneal spur. No bone destruction to suggest osteomyelitis. No acute bony abnormality. Specifically, no fracture, subluxation, or dislocation. Diffuse vascular calcifications. IMPRESSION: No acute bony abnormality. Electronically Signed   By: Rolm Baptise M.D.   On: 08/05/2020 00:45   DG ESOPHAGUS W SINGLE CM (SOL OR THIN BA)  Result Date: 07/29/2020 CLINICAL DATA:  Dysphagia, choking on food, feels like food sticking, history of aspiration pneumonia, GERD, dilatation in  2020 EXAM: ESOPHOGRAM / BARIUM SWALLOW / BARIUM TABLET STUDY TECHNIQUE: Combined double contrast and single contrast examination performed using effervescent crystals, thick barium liquid, and thin barium liquid. The patient was observed with fluoroscopy swallowing a 13 mm barium sulphate tablet. FLUOROSCOPY TIME:  Fluoroscopy Time:  2 minutes 24 seconds Radiation Exposure Index (if provided by the fluoroscopic device): 37.7 mGy Number of Acquired Spot Images: multiple fluoroscopic screen captures COMPARISON:  None FINDINGS: Patient unable to stand upright for imaging. Examination performed with patient at horizontal an with head of table elevated 45 degrees. Diffuse esophageal dysmotility. Laryngeal penetration without aspiration. Vallecular, piriform sinus and or residuals noted. Subjective narrowing of the distal esophagus just above GE junction. Large distal esophageal diverticulum identified , retaining a significant amount of barium, which then freely reflux is into the mid esophagus as far cranially as the aortic arch despite elevation of head of table 45 degrees. A second small developing distal esophageal diverticulum is seen above the epiphrenic diverticulum. 12.5 mm diameter barium tablet became lodged in the large diverticulum; unable to assess patency/diameter of the distal most thoracic esophagus. IMPRESSION: Subjective narrowing of the distal esophagus. 12.5 mm diameter barium tablet became lodged in a large distal esophageal diverticulum; this diverticulum retains a large amount of contrast with subsequent reflux of this contrast to the level of the aortic arch. Second developing small distal esophageal diverticulum. Laryngeal penetration without aspiration although vallecular, piriform sinus and oral residuals are noted; question whether these place patient at risk for aspiration. Consider speech therapy evaluation. Electronically Signed   By: Lavonia Dana M.D.   On: 07/29/2020 10:09  [2  weeks]   Assessment: 87 year old male who lives at home with his wife, requires caregiver support has excellent family support system (daughters) with history significant for PAD, GERD, dementia, A. 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 Delton Coombes, history of dysphagia with dilations in the past most recently March 2020, presenting to the ED with sepsis secondary to pneumonia, dry gangrene of the right great toe, A. fib with RVR, acute metabolic encephalopathy in the setting of acute illness, and GI consulted due to history of dysphagia and esophageal stricture.  Dysphagia: Known prior dilations with last one in March 2020.  He has a history of esophageal dysmotility and concern for achalasia.  Recent BPE April 2022 with subjective narrowing of the distal esophagus, 12.5 mm tablet lodged in the distal esophageal diverticulum, retained and a large amount of contrast with subsequent reflux of contrast to the level of the aortic arch.  Also second developing small distal esophageal diverticulum.  Laryngeal penetration without aspiration although vallecular, piriform sinus, oral residuals noted.  EGD plus or minus dilation and possible Botox injection have been planned as an outpatient for May 2022.  Patient presented to ED at [facility] in March 2022 for choking episode.  Since that time he has been on pudding, ice cream consistencies.  Daughter desires endoscopy once patient is stable.  Since admission he has been tolerating a pured diet. From a sepsis standpoint, he  has clinically improved.  Speech therapy has evaluated the patient this admission and recommended D1/pured diet with thin liquids, p.o. meds whole or crushed as able in pure and follow with liquid wash.  Palliative care also saw patient this admission.  Plan to treat the treatable but no CPR or intubation, no amputation but is agreeable to EGD.  Anemia: Chronic with hemoglobin in the 9-11 range outpatient.   Hemoglobin 8.5 on admission, down to 5.8.  No overt GI bleeding.  2 units of packed red blood cells given.  Hemoglobin back up to 8.5 today.  Iron studies most consistent with anemia of chronic disease.  Chronic pancreatitis: Pancreatic calcification and cystic changes seen on prior CTs.  Cystic lesion of the pancreatic tail in August 2019 on CT.  Reports chronic loose stools with 1-2 BMs daily.  May benefit from pancreatic enzymes. cdiff negative this admission.   Plan: 1. EGD/dilation +/- Botox this admission. Will discuss timing with Dr. Laural Golden. Last dose of Eliquis on April 19 at 10:15 AM.  Lovenox 60 mg at 10 AM and 10 PM daily. 2. Continue diet per speech recommendations. Reiterated to nursing the importance of patient sitting upright during meals and for 30-60 minutes after oral intake due to his esophagus not emptying well and the large esophageal diverticulum.  3. Follow up pending anemia labs.  Laureen Ochs. Bernarda Caffey [facility] [phone] 4/20/20224:23 PM     LOS: 2 days

## 2020-08-07 NOTE — Progress Notes (Signed)
  Speech Language Pathology Treatment: Dysphagia  Patient Details Name: [name] MRN: [id number] DOB: 10-18-1923 Today's Date: 08/07/2020 Time: 2119-4174 SLP Time Calculation (min) (ACUTE ONLY): 17 min  Assessment / Plan / Recommendation Clinical Impression  Pt seen for follow up dysphagia intervention following BSE completed on Monday. GI continues to follow as Pt with known esophageal dysphagia. Pt reportedly doing well with puree and thin liquids, intake improved today over yesterday per nursing and family. Pt appears alert and bright this morning. He was agreeable to drinking apple juice after repositioning to more upright. Pt without overt signs of aspiration and he independently consumed small sips. Family and nursing indicate that GI will hopefully perform EGD in the next few days. Recommend continuing diet as ordered with reflux precautions and SLP to follow up after EGD completed. Pt/family in agreement.    HPI HPI: [name] is a 87 y.o. 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 Delton Coombes, history of dysphagia with dilations in the past and most recently March 2020, presenting to the ED with shortness of breath and fever, confusion, weakness, choking after eating mandarin oranges with worsening coughing yesterday. Admitted with sepsis secondary to pneumonia, dry gangrene of right great toe, afib with RVR, acute metabolic encephalopathy in setting of acute illness, with GI now consulted due to history of dysphagia and esophageal stricture. Daughter states that since March 2022 after ED visit in [place] with choking episode, that he has been eating pudding, ice cream, and applesauce without difficulty until yesterday when he ate canned mandarin oranges. He has  been evaluated already by Dr. Jenetta Downer as outpatient with BPE April 2022 showing subjective narrowing of distal esophagus, 12.5 mm tablet lodged in distal esophageal diverticulum, retaining a large amount of contrast with subsequent reflux of contrast to level of aortic arch. Also second developing small distal esophageal diverticulum. Laryngeal penetration without aspiration although vallecular, piriform sinus, and oral residuals noted. Speech therapy recommended. Plans for EGD with  possible dilation and Botox injection as outpatient for May 2022. Prior EGDs in 2020 and 2017 as noted below. CT chest in March with dilated esophagus, air-filled proximally, containing dependent intraluminal contents in mid and distal aspect, mild irregular distal esophageal wall thickening, suspected chronic dysmotility. BSE requested.      SLP Plan  Continue with current plan of care       Recommendations  Diet recommendations: Dysphagia 1 (puree);Thin liquid Liquids provided via: Cup;Straw Medication Administration: Crushed with puree Supervision: Patient able to self feed;Full supervision/cueing for compensatory strategies Compensations: Slow rate;Small sips/bites;Multiple dry swallows after each bite/sip Postural Changes and/or Swallow Maneuvers: Seated upright 90 degrees;Upright 30-60 min after meal                Oral Care Recommendations: Oral care BID;Staff/trained caregiver to provide oral care Follow up Recommendations: 24 hour supervision/assistance SLP Visit Diagnosis: Dysphagia, unspecified (R13.10) Plan: Continue with current plan of care       Thank you,  Genene Churn, [place]                 [place] 08/07/2020, 11:00 AM

## 2020-08-07 NOTE — Progress Notes (Signed)
Inpatient Diabetes Program Recommendations  AACE/ADA: New Consensus Statement on Inpatient Glycemic Control   Target Ranges:  Prepandial:   less than 140 mg/dL      Peak postprandial:   less than 180 mg/dL (1-2 hours)      Critically ill patients:  140 - 180 mg/dL   Results for LINCON, SAHLIN (MRN [id number]) as of 08/07/2020 12:16  Ref. Range 08/06/2020 07:19 08/06/2020 11:08 08/06/2020 16:20 08/06/2020 21:14 08/07/2020 07:38 08/07/2020 12:07  Glucose-Capillary Latest Ref Range: 70 - 99 mg/dL 151 (H) 243 (H) 328 (H) 262 (H) 178 (H) 229 (H)   Review of Glycemic Control  Diabetes history: DM2 Outpatient Diabetes medications: Glipizide XL 5 mg daily, Metformin 850 mg BID Current orders for Inpatient glycemic control: Novolog 0-9 units TID with meals, Novolog 0-5 units QHS; Solucortef 25 mg daily  Inpatient Diabetes Program Recommendations:    Insulin: If steroids are continued, please consider ordering Novolog 3 units TID with meals for meal coverage if patient eats at least 50% of meals.  Thanks, Barnie Alderman, RN, MSN, CDE Diabetes Coordinator Inpatient Diabetes Program [phone] (Team Pager from 8am to 5pm)

## 2020-08-08 ENCOUNTER — Encounter (HOSPITAL_COMMUNITY): Payer: Self-pay | Admitting: Family Medicine

## 2020-08-08 ENCOUNTER — Inpatient Hospital Stay (HOSPITAL_COMMUNITY): Payer: Medicare Other | Admitting: Anesthesiology

## 2020-08-08 ENCOUNTER — Encounter (HOSPITAL_COMMUNITY)
Admission: EM | Disposition: A | Discharge status: Hospice | Payer: Self-pay | Source: Home / Self Care | Attending: Family Medicine

## 2020-08-08 DIAGNOSIS — K224 Dyskinesia of esophagus: Secondary | ICD-10-CM

## 2020-08-08 DIAGNOSIS — R1314 Dysphagia, pharyngoesophageal phase: Secondary | ICD-10-CM

## 2020-08-08 DIAGNOSIS — B370 Candidal stomatitis: Secondary | ICD-10-CM

## 2020-08-08 DIAGNOSIS — K449 Diaphragmatic hernia without obstruction or gangrene: Secondary | ICD-10-CM

## 2020-08-08 DIAGNOSIS — Q396 Congenital diverticulum of esophagus: Secondary | ICD-10-CM

## 2020-08-08 DIAGNOSIS — K3189 Other diseases of stomach and duodenum: Secondary | ICD-10-CM

## 2020-08-08 DIAGNOSIS — R933 Abnormal findings on diagnostic imaging of other parts of digestive tract: Secondary | ICD-10-CM

## 2020-08-08 DIAGNOSIS — K209 Esophagitis, unspecified without bleeding: Secondary | ICD-10-CM

## 2020-08-08 DIAGNOSIS — K229 Disease of esophagus, unspecified: Secondary | ICD-10-CM

## 2020-08-08 HISTORY — PX: ESOPHAGOGASTRODUODENOSCOPY (EGD) WITH PROPOFOL: SHX5813

## 2020-08-08 HISTORY — PX: BOTOX INJECTION: SHX5754

## 2020-08-08 LAB — COMPREHENSIVE METABOLIC PANEL
ALT: 11 U/L (ref 0–44)
AST: 13 U/L — ABNORMAL LOW (ref 15–41)
Albumin: 2.1 g/dL — ABNORMAL LOW (ref 3.5–5.0)
Alkaline Phosphatase: 57 U/L (ref 38–126)
Anion gap: 5 (ref 5–15)
BUN: 29 mg/dL — ABNORMAL HIGH (ref 8–23)
CO2: 18 mmol/L — ABNORMAL LOW (ref 22–32)
Calcium: 7.7 mg/dL — ABNORMAL LOW (ref 8.9–10.3)
Chloride: 114 mmol/L — ABNORMAL HIGH (ref 98–111)
Creatinine, Ser: 0.8 mg/dL (ref 0.61–1.24)
GFR, Estimated: >60 mL/min (ref >60)
Glucose, Bld: 140 mg/dL — ABNORMAL HIGH (ref 70–99)
Potassium: 3.4 mmol/L — ABNORMAL LOW (ref 3.5–5.1)
Sodium: 137 mmol/L (ref 135–145)
Total Bilirubin: 0.5 mg/dL (ref 0.3–1.2)
Total Protein: 5 g/dL — ABNORMAL LOW (ref 6.5–8.1)

## 2020-08-08 LAB — GLUCOSE, CAPILLARY
Glucose-Capillary: 132 mg/dL — ABNORMAL HIGH (ref 70–99)
Glucose-Capillary: 117 mg/dL — ABNORMAL HIGH (ref 70–99)
Glucose-Capillary: 183 mg/dL — ABNORMAL HIGH (ref 70–99)

## 2020-08-08 LAB — CBC WITH DIFFERENTIAL/PLATELET
Abs Immature Granulocytes: 0.25 10*3/uL — ABNORMAL HIGH (ref 0.00–0.07)
Basophils Absolute: 0 10*3/uL (ref 0.0–0.1)
Basophils Relative: 0 %
Eosinophils Absolute: 0.1 10*3/uL (ref 0.0–0.5)
Eosinophils Relative: 1 %
HCT: 27.8 % — ABNORMAL LOW (ref 39.0–52.0)
Hemoglobin: 9 g/dL — ABNORMAL LOW (ref 13.0–17.0)
Immature Granulocytes: 3 %
Lymphocytes Relative: 15 %
Lymphs Abs: 1.4 10*3/uL (ref 0.7–4.0)
MCH: 27.9 pg (ref 26.0–34.0)
MCHC: 32.4 g/dL (ref 30.0–36.0)
MCV: 86.1 fL (ref 80.0–100.0)
Monocytes Absolute: 1.2 10*3/uL — ABNORMAL HIGH (ref 0.1–1.0)
Monocytes Relative: 12 %
Neutro Abs: 6.9 10*3/uL (ref 1.7–7.7)
Neutrophils Relative %: 69 %
Platelets: 253 10*3/uL (ref 150–400)
RBC: 3.23 MIL/uL — ABNORMAL LOW (ref 4.22–5.81)
RDW: 14.7 % (ref 11.5–15.5)
WBC: 9.9 10*3/uL (ref 4.0–10.5)
nRBC: 0 % (ref 0.0–0.2)

## 2020-08-08 LAB — VITAMIN B12: Vitamin B-12: 132 pg/mL — ABNORMAL LOW (ref 180–914)

## 2020-08-08 LAB — FOLATE: Folate: 6.5 ng/mL (ref >5.9)

## 2020-08-08 LAB — MAGNESIUM: Magnesium: 1.8 mg/dL (ref 1.7–2.4)

## 2020-08-08 SURGERY — ESOPHAGOGASTRODUODENOSCOPY (EGD) WITH PROPOFOL
Anesthesia: Choice

## 2020-08-08 SURGERY — ESOPHAGOGASTRODUODENOSCOPY (EGD) WITH PROPOFOL
Anesthesia: General

## 2020-08-08 MED ORDER — PROPOFOL 10 MG/ML IV BOLUS
INTRAVENOUS | Status: DC | PRN
  Administered 2020-08-08: 50 mg via INTRAVENOUS
  Administered 2020-08-08: 30 mg via INTRAVENOUS
  Administered 2020-08-08: 10 mg via INTRAVENOUS
  Administered 2020-08-08: 50 mg via INTRAVENOUS
  Administered 2020-08-08: 20 mg via INTRAVENOUS

## 2020-08-08 MED ORDER — CHLORHEXIDINE GLUCONATE CLOTH 2 % EX PADS
6.0000 | MEDICATED_PAD | Freq: Once | CUTANEOUS | Status: DC
  Administered 2020-08-08: 6 via TOPICAL

## 2020-08-08 MED ORDER — POTASSIUM CHLORIDE 10 MEQ/100ML IV SOLN
10.0000 meq | INTRAVENOUS | Status: AC
  Administered 2020-08-08 (×2): 10 meq via INTRAVENOUS
  Filled 2020-08-08 (×2): qty 100

## 2020-08-08 MED ORDER — ALPRAZOLAM 0.5 MG PO TABS
0.5000 mg | ORAL_TABLET | Freq: Every evening | ORAL | Status: DC | PRN
  Administered 2020-08-10: 0.5 mg via ORAL
  Filled 2020-08-08: qty 1

## 2020-08-08 MED ORDER — PROPOFOL 1000 MG/100ML IV EMUL
INTRAVENOUS | Status: AC
  Filled 2020-08-08: qty 100

## 2020-08-08 MED ORDER — ONABOTULINUMTOXINA 100 UNITS IJ SOLR
100.0000 [IU] | INTRAMUSCULAR | Status: AC
  Filled 2020-08-08: qty 100

## 2020-08-08 MED ORDER — ENOXAPARIN SODIUM 60 MG/0.6ML ~~LOC~~ SOLN
60.0000 mg | Freq: Two times a day (BID) | SUBCUTANEOUS | Status: DC
  Administered 2020-08-08 – 2020-08-11 (×6): 60 mg via SUBCUTANEOUS
  Filled 2020-08-08 (×6): qty 0.6

## 2020-08-08 MED ORDER — SODIUM CHLORIDE 0.9 % IV SOLN: INTRAVENOUS | Status: DC

## 2020-08-08 MED ORDER — LIDOCAINE HCL (CARDIAC) PF 50 MG/5ML IV SOSY
PREFILLED_SYRINGE | INTRAVENOUS | Status: DC | PRN
  Administered 2020-08-08: 50 mg via INTRAVENOUS

## 2020-08-08 MED ORDER — LACTATED RINGERS IV SOLN: INTRAVENOUS | Status: DC

## 2020-08-08 MED ORDER — SODIUM CHLORIDE (PF) 0.9 % IJ SOLN
INTRAMUSCULAR | Status: DC | PRN
  Administered 2020-08-08: 4 mL via SUBMUCOSAL

## 2020-08-08 MED ORDER — NYSTATIN 100000 UNIT/ML MT SUSP
5.0000 mL | Freq: Four times a day (QID) | OROMUCOSAL | Status: DC
  Administered 2020-08-08 – 2020-08-09 (×4): 500000 [IU] via ORAL
  Filled 2020-08-08 (×6): qty 5

## 2020-08-08 MED ORDER — MELATONIN 3 MG PO TABS
6.0000 mg | ORAL_TABLET | Freq: Once | ORAL | Status: AC
  Administered 2020-08-08: 6 mg via ORAL
  Filled 2020-08-08: qty 2

## 2020-08-08 MED ORDER — CHLORHEXIDINE GLUCONATE CLOTH 2 % EX PADS: 6.0000 | MEDICATED_PAD | Freq: Once | CUTANEOUS | Status: DC

## 2020-08-08 NOTE — Anesthesia Postprocedure Evaluation (Signed)
Anesthesia Post Note  Patient: ARVON SCHREINER  Procedure(s) Performed: ESOPHAGOGASTRODUODENOSCOPY (EGD) WITH PROPOFOL (N/A ) BOTOX INJECTION (N/A )  Patient location during evaluation: Phase II Anesthesia Type: General Level of consciousness: awake Pain management: pain level controlled Vital Signs Assessment: post-procedure vital signs reviewed and stable Respiratory status: spontaneous breathing and respiratory function stable Cardiovascular status: blood pressure returned to baseline and stable Postop Assessment: no headache and no apparent nausea or vomiting Anesthetic complications: no Comments: Late entry   No complications documented.   Last Vitals:  Vitals:   08/08/20 1215 08/08/20 1248  BP: 124/82 (!) 141/82  Pulse: 78 74  Resp: 17 18  Temp:  36.4 C  SpO2: 96% 99%    Last Pain:  Vitals:   08/08/20 1248  TempSrc: Axillary  PainSc: 0-No pain                 Louann Sjogren

## 2020-08-08 NOTE — Progress Notes (Signed)
Brief EGD note.  Mild hypopharyngeal and esophageal candidiasis(proximal esophagus) Large esophageal diverticulum containing liquid and small amount of food debris which was suctioned out. Circumferential ulceration distal to esophageal diverticulum. Spastic segment proximal to GE junction.  Botox injected; four-quadrant injection with 25 units at each site resulting in decrease in resistance to passing the scope. Small sliding hiatal hernia. Antral erythema otherwise normal examination the stomach. Normal bulbar and post bulbar mucosa.

## 2020-08-08 NOTE — Plan of Care (Signed)
  Problem: Acute Rehab PT Goals(only PT should resolve) Goal: Pt Will Go Supine/Side To Sit Flowsheets (Taken 08/08/2020 1751) Pt will go Supine/Side to Sit: with minimal assist Goal: Patient Will Transfer Sit To/From Stand Flowsheets (Taken 08/08/2020 1751) Patient will transfer sit to/from stand: with moderate assist Goal: Pt Will Transfer Bed To Chair/Chair To Bed Flowsheets (Taken 08/08/2020 1751) Pt will Transfer Bed to Chair/Chair to Bed: with mod assist Goal: Pt Will Ambulate Flowsheets (Taken 08/08/2020 1751) Pt will Ambulate:  10 feet  with minimal assist  with rolling walker   5:52 PM, 08/08/20 Josue Hector PT DPT  Physical Therapist with [facility]  [phone]

## 2020-08-08 NOTE — Anesthesia Procedure Notes (Signed)
Date/Time: 08/08/2020 11:19 AM Performed by: Vista Deck, CRNA Pre-anesthesia Checklist: Patient identified, Emergency Drugs available, Suction available, Timeout performed and Patient being monitored Patient Re-evaluated:Patient Re-evaluated prior to induction Oxygen Delivery Method: Nasal Cannula

## 2020-08-08 NOTE — Evaluation (Signed)
Physical Therapy Evaluation Patient Details Name: [name] MRN: [id number] DOB: Apr 22, 1923 Today's Date: 08/08/2020   History of Present Illness       87 y.o. male with history of peripheral artery disease, metastatic cancer, GERD, dementia, atrial fibrillation, diabetes mellitus type 2, and more presents the ED with a chief complaint of cough and fever. Patient is confused and not able to provide any history at all. Daughter at bedside reports that patient has not been feeling well for the past 2 days. 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 They have been treating him since November and expecting autoamputation. Pt has had increasing weakness, increasing difficulty with swallow function, esophageal structure and admitted to [facility] with fever, sepsis, aspiration pneumonia, dysphagia, cellulitis around gangrenous right toe.     Clinical Impression  Patient presents supine in bed with daughter present in room. Patient requires Mod A for bed mobility with verbal cues for hand and foot placement. Patient able to sit at bed side with assist. He voices he feels week and is not up to his usual strength. Patient able to stand briefly with Mod A x2 and UEs support using RW. Patient returned to bed with assist. Left supine in bed with HOB elevated, daughter present in room. Discussed DC planning with daughter who states she will be moving in with her parents for a period of time to assist her farther when he returns home from hospital. She states they have an assistant about 50 hours weekly who also helps with transfers. They would prefer home health therapy at this time so long as patient remains near current level of function. Patient will benefit from continued physical therapy in hospital and recommended venue below to increase strength, balance, endurance for safe ADLs and gait.      Follow Up Recommendations Home health  PT;Supervision for mobility/OOB;Supervision/Assistance - 24 hour    Equipment Recommendations       Recommendations for Other Services       Precautions / Restrictions        Mobility  Bed Mobility       Overal bed mobility -- -- -- -- Needs Assistance  Bed Mobility -- -- -- -- Supine to Sit; Sit to Supine  Supine to sit -- -- -- -- Mod assist  Sit to supine -- -- -- -- Mod assist  General bed mobility comments -- -- -- -- Needs LE placement and VCs for hands, weight shifting to sit at EOB  Transfers   Overall transfer level -- -- -- -- Needs assistance  Equipment used -- -- -- -- Rolling walker (2 wheeled); 2 person hand held assist  Transfer via [place] -- -- -- -- --  Transfers -- -- -- -- Sit to/from Stand  Sit to Stand -- -- -- -- +2 physical assistance; Mod assist  General transfer comment -- -- -- -- Able to stand briefly, at beside with mod A x 2, min A once standing, UEs supported using RW. Stands in trunk flexed, knees flexed position, fatigues quickly    Ambulation/Gait                Stairs            Wheelchair Mobility    Modified Rankin (Stroke Patients Only)       Balance Overall balance assessment: Needs assistance Sitting-balance support: Feet supported;Bilateral upper extremity supported Sitting balance-Leahy  Scale: Poor Sitting balance - Comments: seated at EOB Postural control: Posterior lean Standing balance support: Bilateral upper extremity supported;During functional activity Standing balance-Leahy Scale: Poor Standing balance comment: Min A for balance standing at bedside with RW                              08/08/20 1333  Home Living  Family/patient expects to be discharged to: Private residence  Living Arrangements Spouse/significant other;Children (Daughter lives next door, another daughter lives in home as needed to assist with caring for parents)  Available Help at Discharge Family;Available 24  hours/day  Type of [place] One level  Bathroom Shower/Tub Tub/shower unit  Tax adviser - 2 wheels;BSC;Tub bench;Transport chair;Other (comment);Wheelchair - manual;Hospital bed  Prior Function  Level of [place] Needs assistance  Gait / Transfers Assistance Needed Uses RW for ambulation. Requires x2 assist for sit to stands, walks with min A once standing  Communication  Communication [facility]   Extremity/Trunk Assessment                Communication      Cognition                                              General Comments      Exercises     Assessment/Plan    PT Assessment Patient needs continued PT services  PT Problem List Decreased strength;Decreased activity tolerance;Decreased balance;Decreased mobility       PT Treatment Interventions Stair training;Gait training;Functional mobility training;Therapeutic activities;Patient/family education;Balance training;Therapeutic exercise;Wheelchair mobility training;DME instruction    PT Goals (Current goals can be found in the Care Plan section)  Acute Rehab PT Goals Patient Stated Goal: Return home PT Goal Formulation: With patient/family Time For Goal Achievement: 08/22/20 Potential to Achieve Goals: Good    Frequency Min 3X/week   Barriers to discharge        Co-evaluation               AM-PAC PT "6 Clicks" Mobility  Outcome Measure Help needed turning from your back to your side while in a flat bed without using bedrails?: A Lot Help needed moving from lying on your back to sitting on the side of a flat bed without using bedrails?: A Lot Help needed moving to and from a bed to a chair (including a wheelchair)?: A Lot Help needed standing up from a chair using your arms (e.g., wheelchair or bedside chair)?: A Lot Help needed to walk in hospital room?: A Lot Help needed climbing 3-5 steps with a  railing? : Total 6 Click Score: 11    End of Session   Activity Tolerance: Patient limited by fatigue Patient left: in bed;with family/visitor present;with call bell/phone within reach Nurse Communication: Mobility status PT Visit Diagnosis: Unsteadiness on feet (R26.81);Other abnormalities of gait and mobility (R26.89);Muscle weakness (generalized) (M62.81)    Time: 4970-2637 PT Time Calculation (min) (ACUTE ONLY): 35 min   Charges:   PT Evaluation $PT Eval Low Complexity: 1 Low PT Treatments $Therapeutic Activity: 8-22 mins    5:48 PM, 08/08/20 Josue Hector PT DPT  Physical Therapist with [facility]  [phone]

## 2020-08-08 NOTE — Progress Notes (Signed)
SLP Cancellation Note  Patient Details Name: [name] MRN: [id number] DOB: 27-Sep-1923   Cancelled treatment:       Reason Eval/Treat Not Completed: Other (comment) (Pt NPO pending EGD)   Thank you,  Genene Churn, [place]    [place] 08/08/2020, 9:45 AM

## 2020-08-08 NOTE — Op Note (Signed)
[facility] Patient Name: [name] Procedure Date: 08/08/2020 11:09 AM MRN: [id number] Date of Birth: 04/06/1924 Attending MD: Hildred Laser , MD CSN: [id number] Age: 87 Admit Type: Inpatient Procedure:                Upper GI endoscopy Indications:              Esophageal dysphagia, Abnormal esophagram Providers:                Hildred Laser, MD, Crystal Page, Nelma Rothman,                            Technician Referring MD:             Roxan Hockey, MD Medicines:                Propofol per Anesthesia Complications:            No immediate complications. Estimated Blood Loss:     Estimated blood loss was minimal. Procedure:                Pre-Anesthesia Assessment:                           - Prior to the procedure, a History and Physical                            was performed, and patient medications and                            allergies were reviewed. The patient's tolerance of                            previous anesthesia was also reviewed. The risks                            and benefits of the procedure and the sedation                            options and risks were discussed with the patient.                            All questions were answered, and informed consent                            was obtained. Prior Anticoagulants: The patient                            last took Lovenox (enoxaparin) 1 day prior to the                            procedure and has taken no previous anticoagulant                            or antiplatelet agents except for aspirin. ASA  Grade Assessment: IV - A patient with severe                            systemic disease that is a constant threat to life.                            After reviewing the risks and benefits, the patient                            was deemed in satisfactory condition to undergo the                            procedure.                           After obtaining informed  consent, the endoscope was                            passed under direct vision. Throughout the                            procedure, the patient's blood pressure, pulse, and                            oxygen saturations were monitored continuously. The                            [id number]-H190 ([id number]) scope was introduced through the                            mouth, and advanced to the second part of duodenum.                            The upper GI endoscopy was accomplished without                            difficulty. The patient tolerated the procedure                            well. Scope In: 11:31:56 AM Scope Out: 11:43:35 AM Total Procedure Duration: 0 hours 11 minutes 39 seconds  Findings:      Ritta Slot was found in the oropharynx.      Patchy plaques were found in the upper third of the esophagus.      A non-bleeding diverticulum with a large opening and no stigmata of       recent bleeding was found in the lower third of the esophagus.      LA Grade D (one or more mucosal breaks involving at least 75% of       esophageal circumference) esophagitis with no bleeding was found 33 to       35 cm from the incisors.      Abnormal motility was noted in the distal esophagus. There is spasticity       of the esophageal body. The distal esophagus/lower esophageal sphincter  is spastic, but gives up passage to the endoscope. Area was successfully       injected with 100 units botulinum toxin.      The Z-line was regular and was found 38 cm from the incisors.      A 3 cm hiatal hernia was present.      Patchy mildly erythematous mucosa without bleeding was found in the       gastric antrum.      The exam of the stomach was otherwise normal.      The duodenal bulb and second portion of the duodenum were normal. Impression:               - Thrush was found in the oropharynx.                           - Esophageal plaques were found, consistent with                             candidiasis.                           - Diverticulum in the lower third of the esophagus.                           - LA Grade D esophagitis with no bleeding. 2 cm                            long segment proximal to spastic segment.                           - Abnormal esophageal motility, consistent with                            esophageal spasm. Injected with botulinum toxin.                           - Z-line regular, 38 cm from the incisors.                           - 3 cm hiatal hernia.                           - Erythematous mucosa in the antrum most likely due                            to aspirin.                           - Normal duodenal bulb and second portion of the                            duodenum.                           - No specimens collected. Moderate Sedation:         Recommendation:           -  Return patient to ICU for ongoing care.                           - Pureed diet today.                           - Continue present medications.                           - Mycostatin suspension 500,000 units swish and                            swallow 4 times a day                           - Resume Lovenox (enoxaparin) at prior dose                            tomorrow.                           - Avoid NSAIDs, potassium pills and doxycycline.                           - Repeat upper endoscopy PRN. Procedure Code(s):        --- Professional ---                           [phone], Esophagogastroduodenoscopy, flexible,                            transoral; with directed submucosal injection(s),                            any substance Diagnosis Code(s):        --- Professional ---                           B37.0, Candidal stomatitis                           K22.9, Disease of esophagus, unspecified                           Q39.6, Congenital diverticulum of esophagus                           K20.90, Esophagitis, unspecified without bleeding                            K22.4, Dyskinesia of esophagus                           K44.9, Diaphragmatic hernia without obstruction or                            gangrene  K31.89, Other diseases of stomach and duodenum                           R13.14, Dysphagia, pharyngoesophageal phase                           R93.3, Abnormal findings on diagnostic imaging of                            other parts of digestive tract CPT copyright 2019 [employer]. All rights reserved. The codes documented in this report are preliminary and upon coder review may  be revised to meet current compliance requirements. Hildred Laser, MD Hildred Laser, MD 08/08/2020 12:08:14 PM This report has been signed electronically. Number of Addenda: 0

## 2020-08-08 NOTE — Transfer of Care (Signed)
Immediate Anesthesia Transfer of Care Note  Patient: STANLY SI  Procedure(s) Performed: ESOPHAGOGASTRODUODENOSCOPY (EGD) WITH PROPOFOL (N/A ) BOTOX INJECTION (N/A )  Patient Location: PACU  Anesthesia Type:General  Level of Consciousness: awake and patient cooperative  Airway & Oxygen Therapy: Patient Spontanous Breathing  Post-op Assessment: Report given to RN and Post -op Vital signs reviewed and stable  Post vital signs: Reviewed and stable  Last Vitals:  Vitals Value Taken Time  BP    Temp    Pulse    Resp    SpO2     SEE VITAL SIGN FLOW SHEET Last Pain:  Vitals:   08/08/20 1121  TempSrc:   PainSc: 0-No pain         Complications: No complications documented.

## 2020-08-08 NOTE — Progress Notes (Signed)
Subjective:  Patient says he feels nervous.  He denies chest pain or shortness of breath.  He also denies abdominal pain.  Current Medications:  Current Facility-Administered Medications:  .  0.9 %  sodium chloride infusion, , Intravenous, Continuous, Mahala Menghini, PA-C .  [MAR Hold] acetaminophen (TYLENOL) tablet 650 mg, 650 mg, Oral, Q6H PRN, 650 mg at 08/07/20 2313 **OR** [MAR Hold] acetaminophen (TYLENOL) suppository 650 mg, 650 mg, Rectal, Q6H PRN, Zierle-Ghosh, Asia B, DO .  [MAR Hold] ALPRAZolam (XANAX) tablet 0.5 mg, 0.5 mg, Oral, QHS PRN, Denton Brick, Courage, MD .  [facility] Hold] aspirin chewable tablet 81 mg, 81 mg, Oral, Daily, Zierle-Ghosh, Asia B, DO, 81 mg at 08/07/20 0928 .  [MAR Hold] botulinum toxin Type A (BOTOX) injection 100 Units, 100 Units, Intramuscular, To Endo, Quenisha Lovins U, MD .  [facility] Hold] ceFEPIme (MAXIPIME) 2 g in sodium chloride 0.9 % 100 mL IVPB, 2 g, Intravenous, Q12H, Rancour, Annie Main, MD, Stopped at 08/07/20 2237 .  [MAR Hold] Chlorhexidine Gluconate Cloth 2 % PADS 6 each, 6 each, Topical, Daily, Zierle-Ghosh, Asia B, DO, 6 each at 08/08/20 1013 .  6 CHG cloth bath night before surgery, , , Once **AND** [START ON 08/09/2020] 6 CHG cloth bath AM of surgery, , , Once **AND** Chlorhexidine Gluconate Cloth 2 % PADS 6 each, 6 each, Topical, Once **AND** Chlorhexidine Gluconate Cloth 2 % PADS 6 each, 6 each, Topical, Once, Montez Morita, Quillian Quince, MD .  [facility] Hold] dextromethorphan-guaiFENesin (MUCINEX DM) 30-600 MG per 12 hr tablet 1 tablet, 1 tablet, Oral, Daily, Zierle-Ghosh, Asia B, DO .  [MAR Hold] diltiazem (CARDIZEM) tablet 30 mg, 30 mg, Oral, Q6H, Johnson, Clanford L, MD, 30 mg at 08/07/20 2313 .  [MAR Hold] donepezil (ARICEPT) tablet 10 mg, 10 mg, Oral, Daily, Zierle-Ghosh, Asia B, DO, 10 mg at 08/07/20 0928 .  [MAR Hold] hydrocortisone sodium succinate (SOLU-CORTEF) 100 MG injection 25 mg, 25 mg, Intravenous, Daily, Johnson, Clanford L, MD, 25 mg at 08/08/20  1007 .  [MAR Hold] insulin aspart (novoLOG) injection 0-5 Units, 0-5 Units, Subcutaneous, QHS, Zierle-Ghosh, Asia B, DO, 3 Units at 08/06/20 2208 .  [MAR Hold] insulin aspart (novoLOG) injection 0-9 Units, 0-9 Units, Subcutaneous, TID WC, Zierle-Ghosh, Asia B, DO, 3 Units at 08/07/20 1721 .  lactated ringers infusion, , Intravenous, Continuous, Kiel, Coralie Keens, MD .  [facility] Hold] MEDLINE mouth rinse, 15 mL, Mouth Rinse, BID, Zierle-Ghosh, Asia B, DO, 15 mL at 08/08/20 1015 .  [MAR Hold] metroNIDAZOLE (FLAGYL) IVPB 500 mg, 500 mg, Intravenous, Q8H, Johnson, Clanford L, MD, Last Rate: 100 mL/hr at 08/08/20 1006, 500 mg at 08/08/20 1006 .  [MAR Hold] mirtazapine (REMERON) tablet 15 mg, 15 mg, Oral, QHS, Zierle-Ghosh, Asia B, DO, 15 mg at 08/07/20 2206 .  [MAR Hold] ondansetron (ZOFRAN) tablet 4 mg, 4 mg, Oral, Q6H PRN **OR** [MAR Hold] ondansetron (ZOFRAN) injection 4 mg, 4 mg, Intravenous, Q6H PRN, Zierle-Ghosh, Asia B, DO .  [MAR Hold] pantoprazole sodium (PROTONIX) 40 mg/20 mL oral suspension 40 mg, 40 mg, Per Tube, Daily, Johnson, Clanford L, MD, 40 mg at 08/07/20 0928 .  [MAR Hold] potassium chloride 10 mEq in 100 mL IVPB, 10 mEq, Intravenous, Q1 Hr x 4, Emokpae, Courage, MD, Last Rate: 100 mL/hr at 08/08/20 1010, 10 mEq at 08/08/20 1010   Objective: Blood pressure (!) 145/81, pulse 89, temperature 98 F (36.7 C), temperature source Oral, resp. rate (!) 21, height $RemoveBe'5\' 10"'[id number]$  (1.778 m), weight 58.5 kg, SpO2 100 %. Patient  is alert and in no acute distress. He has hearing impairment. Conjunctiva is pale. Sclera is nonicteric Oropharyngeal mucosa is normal. No neck masses or thyromegaly noted. Cardiac exam with regular rhythm normal S1 and S2.  Grade 2/6 systolic murmur best heard at aortic area. Vesicular breath sounds except he has few crackles at left base. Abdomen abdomen is soft and nontender with organomegaly or masses. He has edema involving right foot and ankle. He is wearing socks hiding  dry gangrene of right great toe. He has multiple ecchymosis over upper extremities.  Labs/studies Results:  CBC Latest Ref Rng & Units 08/08/2020 08/07/2020 08/06/2020  WBC 4.0 - 10.5 K/uL 9.9 8.9 12.2(H)  Hemoglobin 13.0 - 17.0 g/dL 9.0(L) 8.5(L) 9.0(L)  Hematocrit 39.0 - 52.0 % 27.8(L) 25.9(L) 27.2(L)  Platelets 150 - 400 K/uL 253 240 275    CMP Latest Ref Rng & Units 08/08/2020 08/07/2020 08/06/2020  Glucose 70 - 99 mg/dL 140(H) 174(H) 155(H)  BUN 8 - 23 mg/dL 29(H) 31(H) 30(H)  Creatinine 0.61 - 1.24 mg/dL 0.80 0.96 0.97  Sodium 135 - 145 mmol/L 137 138 140  Potassium 3.5 - 5.1 mmol/L 3.4(L) 3.5 4.6  Chloride 98 - 111 mmol/L 114(H) 113(H) 114(H)  CO2 22 - 32 mmol/L 18(L) 16(L) 17(L)  Calcium 8.9 - 10.3 mg/dL 7.7(L) 7.0(L) 7.9(L)  Total Protein 6.5 - 8.1 g/dL 5.0(L) 4.8(L) 5.2(L)  Total Bilirubin 0.3 - 1.2 mg/dL 0.5 0.6 0.6  Alkaline Phos 38 - 126 U/L 57 53 54  AST 15 - 41 U/L 13(L) 12(L) 9(L)  ALT 0 - 44 U/L 11 5 7     Hepatic Function Latest Ref Rng & Units 08/08/2020 08/07/2020 08/06/2020  Total Protein 6.5 - 8.1 g/dL 5.0(L) 4.8(L) 5.2(L)  Albumin 3.5 - 5.0 g/dL 2.1(L) 2.0(L) 2.2(L)  AST 15 - 41 U/L 13(L) 12(L) 9(L)  ALT 0 - 44 U/L 11 5 7   Alk Phosphatase 38 - 126 U/L 57 53 54  Total Bilirubin 0.3 - 1.2 mg/dL 0.5 0.6 0.6  Bilirubin, Direct 0.0 - 0.2 mg/dL - - -    B12 level 132  folate level 6.5  Assessment:  #1.  Esophageal dysphagia.  He is suspected to have esophageal motility disorder.  He has developed large esophageal diverticulum.  Barium study earlier this month suggested distal esophageal narrowing.  Barium pill actually ended up in diverticulum never reached this segment.  He did respond to esophageal dilation 2 years ago.  Plan is to inject Botox if he has spastic segment as suspected based on barium study. Last Lovenox dose was yesterday morning  #2.  Left lower lobe pneumonia felt to be due to aspiration.  Patient is on cefepime.  #3.  Anemia.  He received a unit  of PRBCs 2 days ago.  B12 level is low.  B12 replacement per Dr. Roxan Hockey.  #4.  Peripheral vascular disease.  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  #5.  Diabetes mellitus.  Glucose  117 mg in preop.  #6.  Malnutrition primarily secondary to diminished intake due to dysphagia.  Plan:  Proceed with esophagogastroduodenoscopy and possible Botox injection to distal esophageal segment under monitored anesthesia care. I have reviewed the procedure with the patient and also talked with [name] this morning and they are both agreeable.

## 2020-08-08 NOTE — Plan of Care (Signed)

## 2020-08-08 NOTE — Progress Notes (Signed)
PROGRESS NOTE   [name]  LNL:[id number] DOB: 07-07-1923 DOA: 08/05/2020 PCP: [name], NP   Chief Complaint  Patient presents with  . Code Sepsis   Level of care: Med-Surg  Brief Admission History:  87 y.o. male with history of peripheral artery disease, metastatic cancer, GERD, dementia, atrial fibrillation, diabetes mellitus type 2, and more presents the ED with a chief complaint of cough and fever.  Patient is confused and not able to provide any history at all.  Daughter at bedside reports that patient has not been feeling well for the past 2 days.  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  They have been treating him since November and expecting autoamputation. Pt has had increasing weakness, increasing difficulty with swallow function, esophageal structure and admitted to [facility] with fever, sepsis, aspiration pneumonia, dysphagia, cellulitis around gangrenous right toe.   Assessment & Plan:   Principal Problem:   Sepsis ([place]) Active Problems:   ATRIAL FIBRILLATION   Dysphagia   Type 2 diabetes mellitus (HCC)   Gangrene of toe of right foot (HCC)   Protein-calorie malnutrition, severe  A/p 1)Sepsis secondary to aspiration pneumonia -  -Continue cefepime and Flagyl -Wean down stress dose steroids--currently on Solu-Cortef 25 mg daily    2)Dysphagia--speech pathology eval appreciated, recommends  Dysphagia 1 (puree);Thin liquid -GI consult appreciated  -Status post EGD with dilatation and Botox injections on 08/08/2020, -EGD with spasms, esophagitis and gastritis concerns for Candida esophagitis -Continue Mycostatin and PPI  3)DM2-A1c is 5.6 reflecting excellent diabetic control PTA,--hold metformin and glipizide Use Novolog/Humalog Sliding scale insulin with Accu-Cheks/Fingersticks as ordered   4)Severe protein calorie malnutrition---partly due to swallowing  difficulties/dysphagia -Continue Remeron for appetite stimulation - appreciate dietitian consultation and recommendations  5)Atrial fibrillation /PAD -Restart Lovenox bridge to allow for EGD with dilatation, -May resume apixaban probably in a.m. - HR remains controlled on diltiazem  6)Dry gangrene right great toe - appreciate surgery consult -Anticipate auto amputation of right big toe - daily betadine swabs and local care  7) social/ethics--- remains DNR, palliative care consult appreciated -Patient and family request no limitations to treatment at this time  8)Normocytic Anemia -there is no evidence of active bleeding -Serum iron is not low, ferritin is not low -Patient received 2 units of PRBC -Hemoglobin 8.5 from a low of 5.8 -Check B12 and folate  9)Hypokalemia / Hypomagnesemia -Replaced  10) generalized weakness/deconditioning/ambulatory dysfunction--- anticipate discharge home with home health physical therapy   DVT prophylaxis: apixaban  Code Status: DNR  Family Communication: daughter at bedside updated  Disposition: TBD Status is: Inpatient  Remains inpatient appropriate because:IV treatments appropriate due to intensity of illness or inability to take PO and Inpatient level of care appropriate due to severity of illness   Dispo: The patient is from: Home              Anticipated d/c is to: TBD              Patient currently is not medically stable to d/c.   Difficult to place patient No   Consultants:   GI  Palliative care  surgery  Procedures:   EGD with dilatation and Botox on 08/08/2020  Antimicrobials:  Cefepime 4/17>> Vancomycin 4/17-4/18 Metronidazole 4/18>>    Subjective:  daughter at bedside, questions answered -Wants to eat post EGD  Objective: Vitals:   08/08/20 1151 08/08/20 1200 08/08/20 1215 08/08/20 1248  BP: (!) 81/55 97/69 124/82 (!) 141/82  Pulse:   78 74  Resp: (!) 9 17 17 18   Temp: (!) 97.5 F (36.4 C)   97.6 F  (36.4 C)  TempSrc:    Axillary  SpO2: 97% 97% 96% 99%  Weight:      Height:        Intake/Output Summary (Last 24 hours) at 08/08/2020 1716 Last data filed at 08/08/2020 1310 Gross per 24 hour  Intake 650.11 ml  Output 1025 ml  Net -374.89 ml   Filed Weights   08/05/20 0029 08/05/20 0437  Weight: 60.8 kg 58.5 kg    Examination:  Physical Exam  Gen:- Awake Alert, no acute distress HEENT:- [place].AT, No sclera icterus, very HOH Neck-Supple Neck,No JVD,.  Lungs-diminished breath sounds, no wheezing CV- S1, S2 normal, irregular Abd-  +ve B.Sounds, Abd Soft, No tenderness,    Extremity/Skin:- No  edema, right great toe gangrenous (dry), left foot with missing great toe and second toe Psych-affect is appropriate, oriented x3 Neuro-generalized weakness, no new focal deficits, no tremors   Data Reviewed: I have personally reviewed following labs and imaging studies  CBC: Recent Labs  Lab 08/05/20 0015 08/05/20 0445 08/06/20 0414 08/06/20 2144 08/07/20 0409 08/08/20 0615  WBC 19.3* 20.6* 10.1 12.2* 8.9 9.9  NEUTROABS 15.2* 16.2* 8.6*  --  7.2 6.9  HGB 8.5* 7.3* 5.8* 9.0* 8.5* 9.0*  HCT 25.5* 22.4* 18.3* 27.2* 25.9* 27.8*  MCV 84.4 85.5 85.9 86.6 86.9 86.1  PLT 378 261 266 275 240 656    Basic Metabolic Panel: Recent Labs  Lab 08/05/20 0015 08/05/20 0445 08/06/20 0414 08/07/20 0409 08/08/20 0615  NA 138 137 140 138 137  K 4.0 4.1 4.6 3.5 3.4*  CL 112* 113* 114* 113* 114*  CO2 15* 17* 17* 16* 18*  GLUCOSE 188* 164* 155* 174* 140*  BUN 27* 25* 30* 31* 29*  CREATININE 1.13 1.00 0.97 0.96 0.80  CALCIUM 8.3* 7.6* 7.9* 7.0* 7.7*  MG  --  0.5* 1.4* 2.0 1.8    GFR: Estimated Creatinine Clearance: 46.7 mL/min (by C-G formula based on SCr of 0.8 mg/dL).  Liver Function Tests: Recent Labs  Lab 08/05/20 0015 08/05/20 0445 08/06/20 0414 08/07/20 0409 08/08/20 0615  AST 11* 12* 9* 12* 13*  ALT 9 8 7 5 11   ALKPHOS 81 64 54 53 57  BILITOT 0.6 0.5 0.6 0.6 0.5   PROT 6.8 5.6* 5.2* 4.8* 5.0*  ALBUMIN 2.9* 2.4* 2.2* 2.0* 2.1*    CBG: Recent Labs  Lab 08/07/20 1720 08/07/20 2021 08/08/20 0747 08/08/20 1059 08/08/20 1649  GLUCAP 203* 184* 132* 117* 183*    Recent Results (from the past 240 hour(s))  Resp Panel by RT-PCR (Flu A&B, Covid) Nasopharyngeal Swab     Status: None   Collection Time: 08/05/20 12:15 AM   Specimen: Nasopharyngeal Swab; Nasopharyngeal(NP) swabs in vial transport medium  Result Value Ref Range Status   SARS Coronavirus 2 by RT PCR NEGATIVE NEGATIVE Final    Comment: (NOTE) SARS-CoV-2 target nucleic acids are NOT DETECTED.  The SARS-CoV-2 RNA is generally detectable in upper respiratory specimens during the acute phase of infection. The lowest concentration of SARS-CoV-2 viral copies this assay can detect is 138 copies/mL. A negative result does not preclude SARS-Cov-2 infection and should not be used as the sole basis for treatment or other patient management decisions. A negative result may occur with  improper specimen collection/handling, submission of specimen other than nasopharyngeal swab, presence  of viral mutation(s) within the areas targeted by this assay, and inadequate number of viral copies(<138 copies/mL). A negative result must be combined with clinical observations, patient history, and epidemiological information. The expected result is Negative.  Fact Sheet for Patients:  EntrepreneurPulse.com.au  Fact Sheet for Healthcare Providers:  IncredibleEmployment.be  This test is no t yet approved or cleared by the [place] FDA and  has been authorized for detection and/or diagnosis of SARS-CoV-2 by FDA under an Emergency Use Authorization (EUA). This EUA will remain  in effect (meaning this test can be used) for the duration of the COVID-19 declaration under Section 564(b)(1) of the Act, 21 U.S.C.section 360bbb-3(b)(1), unless the authorization is terminated   or revoked sooner.       Influenza A by PCR NEGATIVE NEGATIVE Final   Influenza B by PCR NEGATIVE NEGATIVE Final    Comment: (NOTE) The Xpert Xpress SARS-CoV-2/FLU/RSV plus assay is intended as an aid in the diagnosis of influenza from Nasopharyngeal swab specimens and should not be used as a sole basis for treatment. Nasal washings and aspirates are unacceptable for Xpert Xpress SARS-CoV-2/FLU/RSV testing.  Fact Sheet for Patients: EntrepreneurPulse.com.au  Fact Sheet for Healthcare Providers: IncredibleEmployment.be  This test is not yet approved or cleared by the [place] FDA and has been authorized for detection and/or diagnosis of SARS-CoV-2 by FDA under an Emergency Use Authorization (EUA). This EUA will remain in effect (meaning this test can be used) for the duration of the COVID-19 declaration under Section 564(b)(1) of the Act, 21 U.S.C. section 360bbb-3(b)(1), unless the authorization is terminated or revoked.  Performed at [facility], [address] [place], [place] [address]   Blood Culture (routine x 2)     Status: None (Preliminary result)   Collection Time: 08/05/20 12:15 AM   Specimen: BLOOD RIGHT FOREARM  Result Value Ref Range Status   Specimen Description BLOOD RIGHT FOREARM  Final   Special Requests   Final    BOTTLES DRAWN AEROBIC AND ANAEROBIC Blood Culture adequate volume   Culture   Final    NO GROWTH 3 DAYS Performed at [facility], [address], [place], [place] [address]    Report Status PENDING  Incomplete  Blood Culture (routine x 2)     Status: None (Preliminary result)   Collection Time: 08/05/20 12:20 AM   Specimen: BLOOD LEFT FOREARM  Result Value Ref Range Status   Specimen Description BLOOD LEFT FOREARM  Final   Special Requests   Final    BOTTLES DRAWN AEROBIC AND ANAEROBIC Blood Culture adequate volume   Culture   Final    NO GROWTH 3 DAYS Performed at [facility], [address] [place], [place] [address]    Report Status PENDING  Incomplete  MRSA PCR Screening     Status: None   Collection Time: 08/05/20  4:13 AM   Specimen: Nasal Mucosa; Nasopharyngeal  Result Value Ref Range Status   MRSA by PCR NEGATIVE NEGATIVE Final    Comment:        The GeneXpert MRSA Assay (FDA approved for NASAL specimens only), is one component of a comprehensive MRSA colonization surveillance program. It is not intended to diagnose MRSA infection nor to guide or monitor treatment for MRSA infections. Performed at [facility], [address] [place], [place] [address]   Urine culture     Status: Abnormal   Collection Time: 08/05/20  3:30 PM   Specimen: Urine, Random  Result Value Ref Range Status  Specimen Description   Final    URINE, RANDOM Performed at [facility], [address] [place], [place] [address]    Special Requests   Final    NONE Performed at [facility], [address], [place], [place] [address]    Culture (A)  Final    <10,000 COLONIES/mL INSIGNIFICANT GROWTH Performed at [facility] [address], [place], [place] [address]    Report Status 08/07/2020 FINAL  Final  C Difficile Quick Screen w PCR reflex     Status: None   Collection Time: 08/06/20 10:40 PM   Specimen: Stool  Result Value Ref Range Status   C Diff antigen NEGATIVE NEGATIVE Final   C Diff toxin NEGATIVE NEGATIVE Final   C Diff interpretation No C. difficile detected.  Final    Comment: Performed at [facility], [address], [place],  [address]     Radiology Studies: No results found.  Scheduled Meds: . aspirin  81 mg Oral Daily  . botulinum toxin Type A  100 Units Intramuscular To Endo  . Chlorhexidine Gluconate Cloth  6 each Topical Daily  . dextromethorphan-guaiFENesin  1 tablet Oral Daily  . diltiazem  30 mg Oral Q6H  . donepezil  10 mg Oral Daily  . hydrocortisone sod succinate (SOLU-CORTEF) inj  25 mg Intravenous Daily  . insulin aspart  0-5 Units  Subcutaneous QHS  . insulin aspart  0-9 Units Subcutaneous TID WC  . mouth rinse  15 mL Mouth Rinse BID  . mirtazapine  15 mg Oral QHS  . nystatin  5 mL Oral QID  . pantoprazole sodium  40 mg Per Tube Daily   Continuous Infusions: . ceFEPime (MAXIPIME) IV 2 g (08/08/20 1310)  . metronidazole 500 mg (08/08/20 1006)    LOS: 3 days   Roxan Hockey, MD How to contact the [facility] Attending or Consulting provider [place] or covering provider during after hours [place], for this patient?  1. Check the care team in [facility] and look for a) attending/consulting [facility] provider listed and b) the [facility] team listed 2. Log into [email or web address] and use [place]'s universal password to access. If you do not have the password, please contact the hospital operator. 3. Locate the [facility] provider you are looking for under [facility] and page to a number that you can be directly reached. 4. If you still have difficulty reaching the provider, please page the [facility] (Director on Call) for the Hospitalists listed on amion for assistance.  08/08/2020, 5:16 PM

## 2020-08-08 NOTE — Anesthesia Preprocedure Evaluation (Signed)
Anesthesia Evaluation  Patient identified by MRN, date of birth, ID band Patient confused    Reviewed: Allergy & Precautions, H&P , NPO status , Patient's Chart, lab work & pertinent test results, reviewed documented beta blocker date and time , Unable to perform ROS - Chart review only  Airway Mallampati: II  TM Distance: >3 FB Neck ROM: full    Dental no notable dental hx.    Pulmonary shortness of breath, pneumonia,    Pulmonary exam normal breath sounds clear to auscultation       Cardiovascular Exercise Tolerance: Good hypertension, + Peripheral Vascular Disease  + dysrhythmias Atrial Fibrillation  Rhythm:irregular Rate:Normal     Neuro/Psych PSYCHIATRIC DISORDERS Dementia  Neuromuscular disease    GI/Hepatic Neg liver ROS, GERD  Medicated,  Endo/Other  negative endocrine ROSdiabetes, Type 2  Renal/GU negative Renal ROS  negative genitourinary   Musculoskeletal   Abdominal   Peds  Hematology negative hematology ROS (+)   Anesthesia Other Findings Pt has history of dysphagia, which has led to caloric deficit and malnutrition.  Palliative care patient - procedure needed to offer hope of better PO intake and reduce   Reproductive/Obstetrics negative OB ROS                             Anesthesia Physical Anesthesia Plan  ASA: IV  Anesthesia Plan: General   Post-op Pain Management:    Induction:   PONV Risk Score and Plan: Propofol infusion  Airway Management Planned:   Additional Equipment:   Intra-op Plan:   Post-operative Plan:   Informed Consent: I have reviewed the patients History and Physical, chart, labs and discussed the procedure including the risks, benefits and alternatives for the proposed anesthesia with the patient or authorized representative who has indicated his/her understanding and acceptance.     Dental Advisory Given  Plan Discussed with:  CRNA  Anesthesia Plan Comments:         Anesthesia Quick Evaluation

## 2020-08-08 NOTE — Progress Notes (Signed)
Pharmacy Antibiotic Note  [name] is a 87 y.o. male admitted on 08/05/2020 with wound infection.  Pharmacy has been consulted for Cefepime dosing.   Plan: Continue cefepime 2gm IV q12 hours F/u renal function, cultures and clinical course  Height: 5\' 10"  (177.8 cm) Weight: 58.5 kg (128 lb 15.5 oz) IBW/kg (Calculated) : 73  Temp (24hrs), Avg:97.6 F (36.4 C), Min:97.5 F (36.4 C), Max:97.7 F (36.5 C)  Recent Labs  Lab 08/05/20 0015 08/05/20 0445 08/06/20 0414 08/06/20 2144 08/07/20 0409 08/08/20 0615  WBC 19.3* 20.6* 10.1 12.2* 8.9 9.9  CREATININE 1.13 1.00 0.97  --  0.96 0.80  LATICACIDVEN 1.5 1.2  --   --   --   --     Estimated Creatinine Clearance: 46.7 mL/min (by C-G formula based on SCr of 0.8 mg/dL).    Allergies  Allergen Reactions  . Tape Other (See Comments)    SKIN IS VERY THIN AND WILL TEAR AND BRUISE VERY EASILY!!!!  . Augmentin [Amoxicillin-Pot Clavulanate] Rash and Other (See Comments)    Redness of skin    Vancomycin 4/18>> 4/18 Cefepime 4/18>> Flagyl 4/18>>  4/18 BCX: ngtd 4/18 UCX: insignificant growth 4/18 MRSA is negative Cdiff neg   Thank you for allowing pharmacy to be a part of this patient's care.  Ramond Craver 08/08/2020 10:22 AM

## 2020-08-09 ENCOUNTER — Encounter (HOSPITAL_COMMUNITY): Payer: Self-pay | Admitting: Internal Medicine

## 2020-08-09 DIAGNOSIS — R1319 Other dysphagia: Secondary | ICD-10-CM | POA: Diagnosis not present

## 2020-08-09 LAB — GLUCOSE, CAPILLARY
Glucose-Capillary: 123 mg/dL — ABNORMAL HIGH (ref 70–99)
Glucose-Capillary: 168 mg/dL — ABNORMAL HIGH (ref 70–99)
Glucose-Capillary: 285 mg/dL — ABNORMAL HIGH (ref 70–99)
Glucose-Capillary: 217 mg/dL — ABNORMAL HIGH (ref 70–99)

## 2020-08-09 LAB — CBC WITH DIFFERENTIAL/PLATELET
Abs Immature Granulocytes: 0.24 10*3/uL — ABNORMAL HIGH (ref 0.00–0.07)
Basophils Absolute: 0 10*3/uL (ref 0.0–0.1)
Basophils Relative: 0 %
Eosinophils Absolute: 0.1 10*3/uL (ref 0.0–0.5)
Eosinophils Relative: 1 %
HCT: 29.8 % — ABNORMAL LOW (ref 39.0–52.0)
Hemoglobin: 9.8 g/dL — ABNORMAL LOW (ref 13.0–17.0)
Immature Granulocytes: 2 %
Lymphocytes Relative: 20 %
Lymphs Abs: 2 10*3/uL (ref 0.7–4.0)
MCH: 28.4 pg (ref 26.0–34.0)
MCHC: 32.9 g/dL (ref 30.0–36.0)
MCV: 86.4 fL (ref 80.0–100.0)
Monocytes Absolute: 1.2 10*3/uL — ABNORMAL HIGH (ref 0.1–1.0)
Monocytes Relative: 13 %
Neutro Abs: 6.4 10*3/uL (ref 1.7–7.7)
Neutrophils Relative %: 64 %
Platelets: 241 10*3/uL (ref 150–400)
RBC: 3.45 MIL/uL — ABNORMAL LOW (ref 4.22–5.81)
RDW: 15 % (ref 11.5–15.5)
WBC: 9.9 10*3/uL (ref 4.0–10.5)
nRBC: 0 % (ref 0.0–0.2)

## 2020-08-09 LAB — COMPREHENSIVE METABOLIC PANEL
ALT: 13 U/L (ref 0–44)
AST: 17 U/L (ref 15–41)
Albumin: 2.1 g/dL — ABNORMAL LOW (ref 3.5–5.0)
Alkaline Phosphatase: 53 U/L (ref 38–126)
Anion gap: 6 (ref 5–15)
BUN: 25 mg/dL — ABNORMAL HIGH (ref 8–23)
CO2: 18 mmol/L — ABNORMAL LOW (ref 22–32)
Calcium: 7.6 mg/dL — ABNORMAL LOW (ref 8.9–10.3)
Chloride: 114 mmol/L — ABNORMAL HIGH (ref 98–111)
Creatinine, Ser: 0.82 mg/dL (ref 0.61–1.24)
GFR, Estimated: >60 mL/min (ref >60)
Glucose, Bld: 153 mg/dL — ABNORMAL HIGH (ref 70–99)
Potassium: 3.7 mmol/L (ref 3.5–5.1)
Sodium: 138 mmol/L (ref 135–145)
Total Bilirubin: 0.5 mg/dL (ref 0.3–1.2)
Total Protein: 5 g/dL — ABNORMAL LOW (ref 6.5–8.1)

## 2020-08-09 LAB — HEMOGLOBIN A1C

## 2020-08-09 LAB — MAGNESIUM: Magnesium: 1.5 mg/dL — ABNORMAL LOW (ref 1.7–2.4)

## 2020-08-09 MED ORDER — FLUCONAZOLE 40 MG/ML PO SUSR
200.0000 mg | Freq: Every day | ORAL | Status: DC
  Filled 2020-08-09 (×3): qty 5

## 2020-08-09 MED ORDER — MAGNESIUM SULFATE 2 GM/50ML IV SOLN
2.0000 g | Freq: Once | INTRAVENOUS | Status: AC
  Administered 2020-08-09: 2 g via INTRAVENOUS
  Filled 2020-08-09: qty 50

## 2020-08-09 MED ORDER — FLUCONAZOLE 40 MG/ML PO SUSR
400.0000 mg | Freq: Once | ORAL | Status: AC
  Administered 2020-08-10: 400 mg via ORAL
  Filled 2020-08-09: qty 10

## 2020-08-09 MED ORDER — FLUCONAZOLE 40 MG/ML PO SUSR: 200.0000 mg | Freq: Every day | ORAL | Status: DC

## 2020-08-09 MED ORDER — CYANOCOBALAMIN 1000 MCG/ML IJ SOLN
1000.0000 ug | Freq: Once | INTRAMUSCULAR | Status: AC
  Administered 2020-08-09: 1000 ug via INTRAMUSCULAR
  Filled 2020-08-09: qty 1

## 2020-08-09 MED ORDER — HYDROCORTISONE 10 MG PO TABS
10.0000 mg | ORAL_TABLET | Freq: Every day | ORAL | Status: DC
  Administered 2020-08-10: 10 mg via ORAL
  Filled 2020-08-09 (×3): qty 1

## 2020-08-09 NOTE — Progress Notes (Signed)
PROGRESS NOTE   [name]  BPZ:[id number] DOB: 1923-11-03 DOA: 08/05/2020 PCP: [name], NP   Chief Complaint  Patient presents with  . Code Sepsis   Level of care: Telemetry  Brief Admission History:  87 y.o. male with history of peripheral artery disease, metastatic cancer, GERD, dementia, atrial fibrillation, diabetes mellitus type 2, and more presents the ED with a chief complaint of cough and fever.  Patient is confused and not able to provide any history at all.  Daughter at bedside reports that patient has not been feeling well for the past 2 days.  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  They have been treating him since November and expecting autoamputation. Pt has had increasing weakness, increasing difficulty with swallow function, esophageal structure and admitted to [facility] with fever, sepsis, aspiration pneumonia, dysphagia, cellulitis around gangrenous right toe.   Assessment & Plan:   Principal Problem:   Sepsis ([place]) Active Problems:   ATRIAL FIBRILLATION   Dysphagia   Type 2 diabetes mellitus (HCC)   Gangrene of toe of right foot (HCC)   Protein-calorie malnutrition, severe  A/p 1)Sepsis secondary to aspiration pneumonia -  -Continue cefepime and Flagyl -Wean down stress dose steroids--decrease to 10 mg from Solu-Cortef 25 mg daily   -Unfortunately continues to have episodes of likely aspiration  2)Dysphagia--speech pathology eval appreciated, recommended  Dysphagia 1 (puree);Thin liquid --Unfortunately continues to have episodes of likely aspiration---downgraded to clear liquids at this time -GI consult appreciated  -Status post EGD with dilatation and Botox injections on 08/08/2020, -EGD with spasms, esophagitis and gastritis concerns for Candida esophagitis -Continue Mycostatin and PPI  3)DM2-A1c is 5.6 reflecting excellent diabetic control PTA,--hold metformin and  glipizide Use Novolog/Humalog Sliding scale insulin with Accu-Cheks/Fingersticks as ordered   4)Severe protein calorie malnutrition---partly due to swallowing difficulties/dysphagia -Continue Remeron for appetite stimulation - appreciate dietitian consultation and recommendations  5)Atrial fibrillation /PAD -Restart Lovenox bridge  -May resume apixaban probably in a.m or upon dc home - HR remains controlled on diltiazem  6)Dry gangrene right great toe - appreciate surgery consult -Anticipate auto amputation of right big toe - daily betadine swabs and local care  7) social/ethics--- remains DNR, palliative care consult appreciated -Patient and family request no limitations to treatment at this time  8)Normocytic Anemia -there is no evidence of active bleeding -Serum iron is not low, ferritin is not low -Patient received 2 units of PRBC -Hemoglobin 9.8 from a low of 5.8 -Folate WNL -B12 is low we will replace  9)Hypokalemia / Hypomagnesemia -Replaced  10) generalized weakness/deconditioning/ambulatory dysfunction--- anticipate discharge home with home health physical therapy   DVT prophylaxis: PTA was on apixaban /Lovenox for now Code Status: DNR  Family Communication: daughter and wife at bedside updated  Disposition: Home with home health Status is: Inpatient  Remains inpatient appropriate because:IV treatments appropriate due to intensity of illness or inability to take PO and Inpatient level of care appropriate due to severity of illness   Dispo: The patient is from: Home              Anticipated d/c is to: Home with home health              Patient currently is not medically stable to d/c. ---Unfortunately continues to have episodes of likely aspiration -Awaiting tolerance of oral intake   Difficult to place patient No   Consultants:  GI  Palliative care  surgery  Procedures:   EGD with dilatation and Botox on 08/08/2020  Antimicrobials:  Cefepime  4/17>> Vancomycin 4/17-4/18 Metronidazole 4/18>>    Subjective:  daughter and wife at bedside, questions answered --Unfortunately continues to have episodes of likely aspiration -Did not tolerate full liquid/pudding well  Objective: Vitals:   08/08/20 2032 08/08/20 2138 08/09/20 0348 08/09/20 0758  BP: (!) 144/90 (!) 138/99 (!) 149/76 (!) 147/75  Pulse: (!) 57 (!) 107 70 81  Resp: 19 20 18 16   Temp: 97.6 F (36.4 C) 98.1 F (36.7 C) 98.4 F (36.9 C) 98.2 F (36.8 C)  TempSrc:  Oral  Oral  SpO2: 100% 100% 95% 95%  Weight:      Height:        Intake/Output Summary (Last 24 hours) at 08/09/2020 1803 Last data filed at 08/09/2020 1300 Gross per 24 hour  Intake 0 ml  Output 500 ml  Net -500 ml   Filed Weights   08/05/20 0029 08/05/20 0437  Weight: 60.8 kg 58.5 kg    Examination:  Physical Exam  Gen:- Awake Alert, no acute distress HEENT:- [place].AT, No sclera icterus, very HOH Neck-Supple Neck,No JVD,.  Lungs-sounded very rhonchorous after presumed aspiration episode, and movement is improving  CV- S1, S2 normal, irregular Abd-  +ve B.Sounds, Abd Soft, No tenderness,    Extremity/Skin:- No  edema, right great toe gangrenous (dry), left foot with missing great toe and second toe Psych-affect is appropriate, oriented x3 Neuro-generalized weakness, no new focal deficits, no tremors   Data Reviewed: I have personally reviewed following labs and imaging studies  CBC: Recent Labs  Lab 08/05/20 0445 08/06/20 0414 08/06/20 2144 08/07/20 0409 08/08/20 0615 08/09/20 0520  WBC 20.6* 10.1 12.2* 8.9 9.9 9.9  NEUTROABS 16.2* 8.6*  --  7.2 6.9 6.4  HGB 7.3* 5.8* 9.0* 8.5* 9.0* 9.8*  HCT 22.4* 18.3* 27.2* 25.9* 27.8* 29.8*  MCV 85.5 85.9 86.6 86.9 86.1 86.4  PLT 261 266 275 240 253 650    Basic Metabolic Panel: Recent Labs  Lab 08/05/20 0445 08/06/20 0414 08/07/20 0409 08/08/20 0615 08/09/20 0520  NA 137 140 138 137 138  K 4.1 4.6 3.5 3.4* 3.7  CL 113* 114* 113*  114* 114*  CO2 17* 17* 16* 18* 18*  GLUCOSE 164* 155* 174* 140* 153*  BUN 25* 30* 31* 29* 25*  CREATININE 1.00 0.97 0.96 0.80 0.82  CALCIUM 7.6* 7.9* 7.0* 7.7* 7.6*  MG 0.5* 1.4* 2.0 1.8 1.5*    GFR: Estimated Creatinine Clearance: 45.6 mL/min (by C-G formula based on SCr of 0.82 mg/dL).  Liver Function Tests: Recent Labs  Lab 08/05/20 0445 08/06/20 0414 08/07/20 0409 08/08/20 0615 08/09/20 0520  AST 12* 9* 12* 13* 17  ALT 8 7 5 11 13   ALKPHOS 64 54 53 57 53  BILITOT 0.5 0.6 0.6 0.5 0.5  PROT 5.6* 5.2* 4.8* 5.0* 5.0*  ALBUMIN 2.4* 2.2* 2.0* 2.1* 2.1*    CBG: Recent Labs  Lab 08/08/20 1059 08/08/20 1649 08/09/20 0759 08/09/20 1120 08/09/20 1655  GLUCAP 117* 183* 123* 168* 285*    Recent Results (from the past 240 hour(s))  Resp Panel by RT-PCR (Flu A&B, Covid) Nasopharyngeal Swab     Status: None   Collection Time: 08/05/20 12:15 AM   Specimen: Nasopharyngeal Swab; Nasopharyngeal(NP) swabs in vial transport medium  Result Value Ref Range Status   SARS Coronavirus 2 by RT PCR NEGATIVE NEGATIVE Final    Comment: (NOTE) SARS-CoV-2  target nucleic acids are NOT DETECTED.  The SARS-CoV-2 RNA is generally detectable in upper respiratory specimens during the acute phase of infection. The lowest concentration of SARS-CoV-2 viral copies this assay can detect is 138 copies/mL. A negative result does not preclude SARS-Cov-2 infection and should not be used as the sole basis for treatment or other patient management decisions. A negative result may occur with  improper specimen collection/handling, submission of specimen other than nasopharyngeal swab, presence of viral mutation(s) within the areas targeted by this assay, and inadequate number of viral copies(<138 copies/mL). A negative result must be combined with clinical observations, patient history, and epidemiological information. The expected result is Negative.  Fact Sheet for Patients:   EntrepreneurPulse.com.au  Fact Sheet for Healthcare Providers:  IncredibleEmployment.be  This test is no t yet approved or cleared by the [place] FDA and  has been authorized for detection and/or diagnosis of SARS-CoV-2 by FDA under an Emergency Use Authorization (EUA). This EUA will remain  in effect (meaning this test can be used) for the duration of the COVID-19 declaration under Section 564(b)(1) of the Act, 21 U.S.C.section 360bbb-3(b)(1), unless the authorization is terminated  or revoked sooner.       Influenza A by PCR NEGATIVE NEGATIVE Final   Influenza B by PCR NEGATIVE NEGATIVE Final    Comment: (NOTE) The Xpert Xpress SARS-CoV-2/FLU/RSV plus assay is intended as an aid in the diagnosis of influenza from Nasopharyngeal swab specimens and should not be used as a sole basis for treatment. Nasal washings and aspirates are unacceptable for Xpert Xpress SARS-CoV-2/FLU/RSV testing.  Fact Sheet for Patients: EntrepreneurPulse.com.au  Fact Sheet for Healthcare Providers: IncredibleEmployment.be  This test is not yet approved or cleared by the [place] FDA and has been authorized for detection and/or diagnosis of SARS-CoV-2 by FDA under an Emergency Use Authorization (EUA). This EUA will remain in effect (meaning this test can be used) for the duration of the COVID-19 declaration under Section 564(b)(1) of the Act, 21 U.S.C. section 360bbb-3(b)(1), unless the authorization is terminated or revoked.  Performed at [facility], [address], [place], [place] [address]   Blood Culture (routine x 2)     Status: None (Preliminary result)   Collection Time: 08/05/20 12:15 AM   Specimen: BLOOD RIGHT FOREARM  Result Value Ref Range Status   Specimen Description BLOOD RIGHT FOREARM  Final   Special Requests   Final    BOTTLES DRAWN AEROBIC AND ANAEROBIC Blood Culture adequate volume   Culture    Final    NO GROWTH 4 DAYS Performed at [facility], [address], [place], [place] [address]    Report Status PENDING  Incomplete  Blood Culture (routine x 2)     Status: None (Preliminary result)   Collection Time: 08/05/20 12:20 AM   Specimen: BLOOD LEFT FOREARM  Result Value Ref Range Status   Specimen Description BLOOD LEFT FOREARM  Final   Special Requests   Final    BOTTLES DRAWN AEROBIC AND ANAEROBIC Blood Culture adequate volume   Culture   Final    NO GROWTH 4 DAYS Performed at [facility], [address], [place], [place] [address]    Report Status PENDING  Incomplete  MRSA PCR Screening     Status: None   Collection Time: 08/05/20  4:13 AM   Specimen: Nasal Mucosa; Nasopharyngeal  Result Value Ref Range Status   MRSA by PCR NEGATIVE NEGATIVE Final    Comment:  The GeneXpert MRSA Assay (FDA approved for NASAL specimens only), is one component of a comprehensive MRSA colonization surveillance program. It is not intended to diagnose MRSA infection nor to guide or monitor treatment for MRSA infections. Performed at [facility], [address] [place], [place] [address]   Urine culture     Status: Abnormal   Collection Time: 08/05/20  3:30 PM   Specimen: Urine, Random  Result Value Ref Range Status   Specimen Description   Final    URINE, RANDOM Performed at [facility], [address], [place], [place] [address]    Special Requests   Final    NONE Performed at [facility], [address] [place], [place] [address]    Culture (A)  Final    <10,000 COLONIES/mL INSIGNIFICANT GROWTH Performed at [place] [address] [place], [place] [address]    Report Status 08/07/2020 FINAL  Final  C Difficile Quick Screen w PCR reflex     Status: None   Collection Time: 08/06/20 10:40 PM   Specimen: Stool  Result Value Ref Range Status   C Diff antigen NEGATIVE NEGATIVE Final   C Diff toxin NEGATIVE NEGATIVE Final   C Diff interpretation No C.  difficile detected.  Final    Comment: Performed at [facility], [address], [place], [place] [address]     Radiology Studies: No results found.  Scheduled Meds: . aspirin  81 mg Oral Daily  . Chlorhexidine Gluconate Cloth  6 each Topical Daily  . dextromethorphan-guaiFENesin  1 tablet Oral Daily  . diltiazem  30 mg Oral Q6H  . donepezil  10 mg Oral Daily  . enoxaparin (LOVENOX) injection  60 mg Subcutaneous Q12H  . [START ON 08/10/2020] fluconazole  200 mg Oral Daily  . fluconazole  400 mg Oral Once  . hydrocortisone sod succinate (SOLU-CORTEF) inj  25 mg Intravenous Daily  . insulin aspart  0-5 Units Subcutaneous QHS  . insulin aspart  0-9 Units Subcutaneous TID WC  . mouth rinse  15 mL Mouth Rinse BID  . mirtazapine  15 mg Oral QHS  . pantoprazole sodium  40 mg Per Tube Daily   Continuous Infusions: . ceFEPime (MAXIPIME) IV 2 g (08/09/20 0939)  . metronidazole 500 mg (08/09/20 1727)    LOS: 4 days   Roxan Hockey, MD How to contact the [facility] Attending or Consulting provider [place] or covering provider during after hours [place], for this patient?  1. Check the care team in [facility] and look for a) attending/consulting [facility] provider listed and b) the [facility] team listed 2. Log into [email or web address] and use [place]'s universal password to access. If you do not have the password, please contact the hospital operator. 3. Locate the [facility] provider you are looking for under [facility] and page to a number that you can be directly reached. 4. If you still have difficulty reaching the provider, please page the [facility] (Director on Call) for the Hospitalists listed on amion for assistance.  08/09/2020, 6:03 PM

## 2020-08-09 NOTE — Progress Notes (Signed)
11:45 PM RN called due to patient's HR occasionally dropping into the 30s when asleep.  Patient was asymptomatic.  He was due for Cardizem.  This will be held at this time.

## 2020-08-09 NOTE — Progress Notes (Signed)
HR dropping to mid 30s. Patient is asymptomatic at this time. MD made aware. Cardizem on hold for tonight. Pt is on tele. Will continue to monitor patient.

## 2020-08-09 NOTE — Progress Notes (Signed)
Subjective: Feels "great" today. Admits recent aspiration/choking, but feels better. Sipping on a soda. Denies abdominal pain, N/V. No GI complaints at this time. Family at bedside.  Objective: Vital signs in last 24 hours: Temp:  [97.6 F (36.4 C)-98.4 F (36.9 C)] 98.2 F (36.8 C) (04/22 0758) Pulse Rate:  [57-107] 81 (04/22 0758) Resp:  [16-20] 16 (04/22 0758) BP: (138-149)/(75-99) 147/75 (04/22 0758) SpO2:  [95 %-100 %] 95 % (04/22 0758) Last BM Date: 08/08/20 General:   Alert and oriented, pleasant Head:  Normocephalic and atraumatic. Eyes:  No icterus, sclera clear. Conjuctiva pink.  Heart:  S1, S2 present, no murmurs noted.  Lungs: Some rhonchi bilaterally without wheezing or rales. Minimal cough. Abdomen:  Bowel sounds present, soft, non-tender, non-distended. No HSM or hernias noted. No rebound or guarding. No masses appreciated  Msk:  Symmetrical without gross deformities. Pulses:  Normal pulses noted. Extremities:  Without clubbing or edema. Psych:  Alert and cooperative. Normal mood and affect.  Intake/Output from previous day: 04/21 0701 - 04/22 0700 In: 650.1 [I.V.:150; IV Piggyback:500.1] Out: 600 [Urine:600] Intake/Output this shift: Total I/O In: 0  Out: 500 [Urine:500]  Lab Results: Recent Labs    08/07/20 0409 08/08/20 0615 08/09/20 0520  WBC 8.9 9.9 9.9  HGB 8.5* 9.0* 9.8*  HCT 25.9* 27.8* 29.8*  PLT 240 253 241   BMET Recent Labs    08/07/20 0409 08/08/20 0615 08/09/20 0520  NA 138 137 138  K 3.5 3.4* 3.7  CL 113* 114* 114*  CO2 16* 18* 18*  GLUCOSE 174* 140* 153*  BUN 31* 29* 25*  CREATININE 0.96 0.80 0.82  CALCIUM 7.0* 7.7* 7.6*   LFT Recent Labs    08/07/20 0409 08/08/20 0615 08/09/20 0520  PROT 4.8* 5.0* 5.0*  ALBUMIN 2.0* 2.1* 2.1*  AST 12* 13* 17  ALT 5 11 13   ALKPHOS 53 57 53  BILITOT 0.6 0.5 0.5   PT/INR No results for input(s): LABPROT, INR in the last 72 hours. Hepatitis Panel No results for input(s):  HEPBSAG, HCVAB, HEPAIGM, HEPBIGM in the last 72 hours.   Studies/Results: No results found.  Assessment: 87 year old male who lives at home with his wife, requires caregiver support has excellent family support system (daughters) with history significant for PAD, GERD, dementia, A. 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 Delton Coombes, history of dysphagia with dilations in the past most recently March 2020, presenting to the ED with sepsis secondary to pneumonia, dry gangrene of the right great toe, A. fib with RVR, acute metabolic encephalopathy in the setting of acute illness, and GI consulted due to history of dysphagia and esophageal stricture.  Dysphagia: Known prior dilations with last one in March 2020.  He has a history of esophageal dysmotility and concern for achalasia.  Recent BPE April 2022 with subjective narrowing of the distal esophagus, 12.5 mm tablet lodged in the distal esophageal diverticulum, retained and a large amount of contrast with subsequent reflux of contrast to the level of the aortic arch.  Also second developing small distal esophageal diverticulum.  Laryngeal penetration without aspiration although vallecular, piriform sinus, oral residuals noted.  EGD plus or minus dilation and possible Botox injection have been planned as an outpatient for May 2022.  Patient presented to ED at [facility] in March 2022 for choking episode.  Since that time he has been on pudding, ice cream consistencies.  Since admission he has been tolerating a pured diet. From a sepsis  standpoint, he has clinically improved.  Speech therapy has evaluated the patient this admission and recommended D1/pured diet with thin liquids, p.o. meds whole or crushed as able in pure and follow with liquid wash.  Palliative care also saw patient this admission.  Plan to treat the treatable but no CPR or intubation, no amputation but is agreeable to EGD.  EGD was completed  08/08/2020 which found mild hypopharyngeal and esophageal candidiasis, large esophageal diverticulum continue liquid and small amount of food debris which was suctioned, circumferential ulceration distally esophageal diverticulum, spastic segment of the proximal GE junction status post Botox injection x4 quadrants with 25 units each site resulting in decrease in resistance to scope passage.  Also noted small sliding hiatal hernia, antral erythema otherwise normal stomach, normal duodenum.  Recommended continue pured diet, Mycostatin suspension swish and swallow, avoid NSAIDs, potassium pills, doxycycline.  Anemia: Chronic with hemoglobin in the 9-11 range outpatient.  Hemoglobin 8.5 on admission, down to 5.8.  No overt GI bleeding.  2 units of packed red blood cells given.  Hemoglobin back up to 8.5 yesterday and 9.0 to 9.5 today.  Iron studies most consistent with anemia of chronic disease.  Chronic pancreatitis: Pancreatic calcification and cystic changes seen on prior CTs.  Cystic lesion of the pancreatic tail in August 2019 on CT.  Reports chronic loose stools with 1-2 BMs daily.  May benefit from pancreatic enzymes. cdiff negative this admission.  Unfortunately when the patient was eating ice cream today he aspirated. Initially with rhonchi which improved but still some minimal bilateral rhonchi noted. Appears coherent and pleasant. Reviewed EGD report with family. Patient has been changed to clear liquids. Family noted concern with taking pills in pudding where they adhered to his partial.  Plan: 1. Stop mycostatin 2. Start Diflucan suspension: 400 mg today, 200 mg daily for 14 total days. 3. Hospitalist has changed back to clear liquids 4. Remove upper partial when taking meds to prevent sticking 5. Supportive care 6. Swallowing precautions per SLP 7. Would benefit from review of medications at discharge for unnecessary medications or medications that could be changed to liquid. 8. We will  continue to follow along   Thank you for allowing [place] to participate in the care of [name] Noble, DNP, AGNP-C Adult & Gerontological Nurse Practitioner [facility]    LOS: 4 days    08/09/2020, 3:57 PM

## 2020-08-09 NOTE — Care Management Important Message (Signed)
Important Message  Patient Details  Name: [name] MRN: [id number] Date of Birth: 1923/08/13   Medicare Important Message Given:  Yes     Tommy Medal 08/09/2020, 4:39 PM

## 2020-08-10 DIAGNOSIS — K220 Achalasia of cardia: Secondary | ICD-10-CM | POA: Diagnosis present

## 2020-08-10 DIAGNOSIS — B3781 Candidal esophagitis: Secondary | ICD-10-CM

## 2020-08-10 DIAGNOSIS — Q396 Congenital diverticulum of esophagus: Secondary | ICD-10-CM

## 2020-08-10 LAB — CULTURE, BLOOD (ROUTINE X 2)
Culture: NO GROWTH
Culture: NO GROWTH
Special Requests: ADEQUATE
Special Requests: ADEQUATE

## 2020-08-10 LAB — CBC WITH DIFFERENTIAL/PLATELET
Abs Immature Granulocytes: 0.2 10*3/uL — ABNORMAL HIGH (ref 0.00–0.07)
Basophils Absolute: 0 10*3/uL (ref 0.0–0.1)
Basophils Relative: 0 %
Eosinophils Absolute: 0.1 10*3/uL (ref 0.0–0.5)
Eosinophils Relative: 1 %
HCT: 29.4 % — ABNORMAL LOW (ref 39.0–52.0)
Hemoglobin: 9.8 g/dL — ABNORMAL LOW (ref 13.0–17.0)
Immature Granulocytes: 2 %
Lymphocytes Relative: 26 %
Lymphs Abs: 2.3 10*3/uL (ref 0.7–4.0)
MCH: 28.5 pg (ref 26.0–34.0)
MCHC: 33.3 g/dL (ref 30.0–36.0)
MCV: 85.5 fL (ref 80.0–100.0)
Monocytes Absolute: 1.4 10*3/uL — ABNORMAL HIGH (ref 0.1–1.0)
Monocytes Relative: 17 %
Neutro Abs: 4.6 10*3/uL (ref 1.7–7.7)
Neutrophils Relative %: 54 %
Platelets: 213 10*3/uL (ref 150–400)
RBC: 3.44 MIL/uL — ABNORMAL LOW (ref 4.22–5.81)
RDW: 15.2 % (ref 11.5–15.5)
WBC: 8.6 10*3/uL (ref 4.0–10.5)
nRBC: 0 % (ref 0.0–0.2)

## 2020-08-10 LAB — MAGNESIUM: Magnesium: 1.6 mg/dL — ABNORMAL LOW (ref 1.7–2.4)

## 2020-08-10 LAB — GLUCOSE, CAPILLARY
Glucose-Capillary: 99 mg/dL (ref 70–99)
Glucose-Capillary: 117 mg/dL — ABNORMAL HIGH (ref 70–99)
Glucose-Capillary: 148 mg/dL — ABNORMAL HIGH (ref 70–99)
Glucose-Capillary: 179 mg/dL — ABNORMAL HIGH (ref 70–99)

## 2020-08-10 LAB — COMPREHENSIVE METABOLIC PANEL
ALT: 13 U/L (ref 0–44)
AST: 16 U/L (ref 15–41)
Albumin: 2.1 g/dL — ABNORMAL LOW (ref 3.5–5.0)
Alkaline Phosphatase: 52 U/L (ref 38–126)
Anion gap: 6 (ref 5–15)
BUN: 20 mg/dL (ref 8–23)
CO2: 19 mmol/L — ABNORMAL LOW (ref 22–32)
Calcium: 7.4 mg/dL — ABNORMAL LOW (ref 8.9–10.3)
Chloride: 114 mmol/L — ABNORMAL HIGH (ref 98–111)
Creatinine, Ser: 0.73 mg/dL (ref 0.61–1.24)
GFR, Estimated: >60 mL/min (ref >60)
Glucose, Bld: 99 mg/dL (ref 70–99)
Potassium: 3.3 mmol/L — ABNORMAL LOW (ref 3.5–5.1)
Sodium: 139 mmol/L (ref 135–145)
Total Bilirubin: 0.4 mg/dL (ref 0.3–1.2)
Total Protein: 4.9 g/dL — ABNORMAL LOW (ref 6.5–8.1)

## 2020-08-10 NOTE — Progress Notes (Signed)
PROGRESS NOTE   [name]  ZYS:[id number] DOB: 08/12/1923 DOA: 08/05/2020 PCP: [name], NP   Chief Complaint  Patient presents with  . Code Sepsis   Level of care: Telemetry  Brief Admission History:  87 y.o. male with history of peripheral artery disease, metastatic cancer, GERD, dementia, atrial fibrillation, diabetes mellitus type 2, and more presents the ED with a chief complaint of cough and fever.  Patient is confused and not able to provide any history at all.  Daughter at bedside reports that patient has not been feeling well for the past 2 days.  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  They have been treating him since November and expecting autoamputation. Pt has had increasing weakness, increasing difficulty with swallow function, esophageal structure and admitted to [facility] with fever, sepsis, aspiration pneumonia, dysphagia, cellulitis around gangrenous right toe.   Assessment & Plan:   Principal Problem:   Sepsis ([place]) Active Problems:   Esophageal diverticulum   Esophageal achalasia- s/p botox injection on 07/30/20   Candida esophagitis (HCC)   ATRIAL FIBRILLATION   Dysphagia   Type 2 diabetes mellitus (HCC)   Gangrene of toe of right foot (HCC)   Protein-calorie malnutrition, severe  A/p 1)Sepsis secondary to Aspiration Pneumonia -  -Continue cefepime and Flagyl -Wean down stress dose steroids--decrease to 10 mg from Solu-Cortef 25 mg daily   -Unfortunately continues to have episodes of likely aspiration  2)Dysphagia--- --Unfortunately continues to have episodes of likely aspiration--- -Reevaluated on 08/10/20 by speech pathologist -recommends dysphagia 1 (puree);Thin liquid -GI consult appreciated  -Status post EGD with dilatation and Botox injections on 08/08/2020, -EGD with spasms, esophagitis and gastritis concerns for Candida esophagitis -Continue Mycostatin and  PPI  3)DM2-A1c is 5.6 reflecting excellent diabetic control PTA,--hold metformin and glipizide Use Novolog/Humalog Sliding scale insulin with Accu-Cheks/Fingersticks as ordered   4)Severe protein calorie malnutrition---partly due to swallowing difficulties/dysphagia -Continue Remeron for appetite stimulation - appreciate dietitian consultation and recommendations  5)Atrial Fibrillation /PAD -c/n Lovenox bridge  -May resume apixaban probably upon dc home - HR remains controlled on diltiazem  6)Dry Gangrene Right Great Toe - appreciate surgery consult -Anticipate auto amputation of right big toe - daily betadine swabs and local care  7)Social/Ethics--- remains DNR, palliative care consult appreciated -Patient and family now considering possible de-escalation of care and transitioning to comfort care with hospice involvement -Awaiting hospice nurse to speak with family  8)Normocytic Anemia -there is no evidence of active bleeding -Serum iron is not low, ferritin is not low -Patient received 2 units of PRBC -Hemoglobin 9.8 from a low of 5.8 -Folate WNL -B12 is low we will replace  9)Hypokalemia / Hypomagnesemia -Replaced  10)Generalized weakness/deconditioning/ambulatory dysfunction--- --- Physical therapy evaluation appreciated recommends home health physical therapy  DVT prophylaxis: PTA was on apixaban /Lovenox for now Code Status: DNR  Family Communication: daughter [name] at bedside updated  Disposition: -Anticipate discharge home with hospice services   status is: Inpatient  Remains inpatient appropriate because:Anticipate discharge home with hospice services   Dispo: The patient is from: Home              Anticipated d/c is to: Anticipate discharge home with hospice services  ---Unfortunately continues to have episodes of likely aspiration   Difficult to place patient No   Consultants:   GI  Palliative care  surgery  Procedures:   EGD with dilatation  and  Botox on 08/08/2020  Antimicrobials:  Cefepime 4/17>> Vancomycin 4/17-4/18 Metronidazole 4/18>>    Subjective:  daughter at bedside, questions answered --Unfortunately continues to have episodes of likely aspiration  --Awaiting hospice nurse to speak with family  Objective: Vitals:   08/09/20 0758 08/09/20 2042 08/10/20 0606 08/10/20 0729  BP: (!) 147/75 (!) 152/58 (!) 159/84 (!) 157/101  Pulse: 81 80 84 69  Resp: 16 18 19 16   Temp: 98.2 F (36.8 C) 98.2 F (36.8 C) 98.8 F (37.1 C) 98.1 F (36.7 C)  TempSrc: Oral Oral  Oral  SpO2: 95% 100% 96% 94%  Weight:      Height:        Intake/Output Summary (Last 24 hours) at 08/10/2020 1247 Last data filed at 08/10/2020 0900 Gross per 24 hour  Intake 120 ml  Output 1000 ml  Net -880 ml   Filed Weights   08/05/20 0029 08/05/20 0437  Weight: 60.8 kg 58.5 kg    Examination:  Physical Exam  Gen:- Awake Alert, no acute distress HEENT:- [place].AT, No sclera icterus, very HOH Neck-Supple Neck,No JVD,.  Lungs-fair air movement, no wheezing CV- S1, S2 normal, irregular Abd-  +ve B.Sounds, Abd Soft, No tenderness,    Extremity/Skin:- No  edema, right great toe gangrenous (dry), left foot with missing great toe and second toe Psych-affect is appropriate, oriented x3 Neuro-generalized weakness, no new focal deficits, no tremors   Data Reviewed: I have personally reviewed following labs and imaging studies  CBC: Recent Labs  Lab 08/06/20 0414 08/06/20 2144 08/07/20 0409 08/08/20 0615 08/09/20 0520 08/10/20 0513  WBC 10.1 12.2* 8.9 9.9 9.9 8.6  NEUTROABS 8.6*  --  7.2 6.9 6.4 4.6  HGB 5.8* 9.0* 8.5* 9.0* 9.8* 9.8*  HCT 18.3* 27.2* 25.9* 27.8* 29.8* 29.4*  MCV 85.9 86.6 86.9 86.1 86.4 85.5  PLT 266 275 240 253 241 710    Basic Metabolic Panel: Recent Labs  Lab 08/06/20 0414 08/07/20 0409 08/08/20 0615 08/09/20 0520 08/10/20 0513  NA 140 138 137 138 139  K 4.6 3.5 3.4* 3.7 3.3*  CL 114* 113* 114* 114* 114*   CO2 17* 16* 18* 18* 19*  GLUCOSE 155* 174* 140* 153* 99  BUN 30* 31* 29* 25* 20  CREATININE 0.97 0.96 0.80 0.82 0.73  CALCIUM 7.9* 7.0* 7.7* 7.6* 7.4*  MG 1.4* 2.0 1.8 1.5* 1.6*    GFR: Estimated Creatinine Clearance: 46.7 mL/min (by C-G formula based on SCr of 0.73 mg/dL).  Liver Function Tests: Recent Labs  Lab 08/06/20 0414 08/07/20 0409 08/08/20 0615 08/09/20 0520 08/10/20 0513  AST 9* 12* 13* 17 16  ALT 7 5 11 13 13   ALKPHOS 54 53 57 53 52  BILITOT 0.6 0.6 0.5 0.5 0.4  PROT 5.2* 4.8* 5.0* 5.0* 4.9*  ALBUMIN 2.2* 2.0* 2.1* 2.1* 2.1*    CBG: Recent Labs  Lab 08/09/20 1120 08/09/20 1655 08/09/20 2133 08/10/20 0723 08/10/20 1106  GLUCAP 168* 285* 217* 99 117*    Recent Results (from the past 240 hour(s))  Resp Panel by RT-PCR (Flu A&B, Covid) Nasopharyngeal Swab     Status: None   Collection Time: 08/05/20 12:15 AM   Specimen: Nasopharyngeal Swab; Nasopharyngeal(NP) swabs in vial transport medium  Result Value Ref Range Status   SARS Coronavirus 2 by RT PCR NEGATIVE NEGATIVE Final    Comment: (NOTE) SARS-CoV-2 target nucleic acids are NOT DETECTED.  The SARS-CoV-2 RNA is generally detectable in upper respiratory specimens during the acute phase of infection. The  lowest concentration of SARS-CoV-2 viral copies this assay can detect is 138 copies/mL. A negative result does not preclude SARS-Cov-2 infection and should not be used as the sole basis for treatment or other patient management decisions. A negative result may occur with  improper specimen collection/handling, submission of specimen other than nasopharyngeal swab, presence of viral mutation(s) within the areas targeted by this assay, and inadequate number of viral copies(<138 copies/mL). A negative result must be combined with clinical observations, patient history, and epidemiological information. The expected result is Negative.  Fact Sheet for Patients:   EntrepreneurPulse.com.au  Fact Sheet for Healthcare Providers:  IncredibleEmployment.be  This test is no t yet approved or cleared by the [place] FDA and  has been authorized for detection and/or diagnosis of SARS-CoV-2 by FDA under an Emergency Use Authorization (EUA). This EUA will remain  in effect (meaning this test can be used) for the duration of the COVID-19 declaration under Section 564(b)(1) of the Act, 21 U.S.C.section 360bbb-3(b)(1), unless the authorization is terminated  or revoked sooner.       Influenza A by PCR NEGATIVE NEGATIVE Final   Influenza B by PCR NEGATIVE NEGATIVE Final    Comment: (NOTE) The Xpert Xpress SARS-CoV-2/FLU/RSV plus assay is intended as an aid in the diagnosis of influenza from Nasopharyngeal swab specimens and should not be used as a sole basis for treatment. Nasal washings and aspirates are unacceptable for Xpert Xpress SARS-CoV-2/FLU/RSV testing.  Fact Sheet for Patients: EntrepreneurPulse.com.au  Fact Sheet for Healthcare Providers: IncredibleEmployment.be  This test is not yet approved or cleared by the [place] FDA and has been authorized for detection and/or diagnosis of SARS-CoV-2 by FDA under an Emergency Use Authorization (EUA). This EUA will remain in effect (meaning this test can be used) for the duration of the COVID-19 declaration under Section 564(b)(1) of the Act, 21 U.S.C. section 360bbb-3(b)(1), unless the authorization is terminated or revoked.  Performed at [facility], [address], [place], [place] [address]   Blood Culture (routine x 2)     Status: None   Collection Time: 08/05/20 12:15 AM   Specimen: BLOOD RIGHT FOREARM  Result Value Ref Range Status   Specimen Description BLOOD RIGHT FOREARM  Final   Special Requests   Final    BOTTLES DRAWN AEROBIC AND ANAEROBIC Blood Culture adequate volume   Culture   Final    NO GROWTH  5 DAYS Performed at [facility] [place] [facility], [address] [place], [place] [address]    Report Status 08/10/2020 FINAL  Final  Blood Culture (routine x 2)     Status: None   Collection Time: 08/05/20 12:20 AM   Specimen: BLOOD LEFT FOREARM  Result Value Ref Range Status   Specimen Description BLOOD LEFT FOREARM  Final   Special Requests   Final    BOTTLES DRAWN AEROBIC AND ANAEROBIC Blood Culture adequate volume   Culture   Final    NO GROWTH 5 DAYS Performed at [facility], [address] [place], [place] [address]    Report Status 08/10/2020 FINAL  Final  MRSA PCR Screening     Status: None   Collection Time: 08/05/20  4:13 AM   Specimen: Nasal Mucosa; Nasopharyngeal  Result Value Ref Range Status   MRSA by PCR NEGATIVE NEGATIVE Final    Comment:        The GeneXpert MRSA Assay (FDA approved for NASAL specimens only), is one component of a comprehensive MRSA colonization surveillance program. It is not intended  to diagnose MRSA infection nor to guide or monitor treatment for MRSA infections. Performed at [facility], [address], [place], [place] [address]   Urine culture     Status: Abnormal   Collection Time: 08/05/20  3:30 PM   Specimen: Urine, Random  Result Value Ref Range Status   Specimen Description   Final    URINE, RANDOM Performed at [facility], [address] [place], [place] [address]    Special Requests   Final    NONE Performed at [facility], [address], [place], [place] [address]    Culture (A)  Final    <10,000 COLONIES/mL INSIGNIFICANT GROWTH Performed at [facility] [address] [place], [place] [address]    Report Status 08/07/2020 FINAL  Final  C Difficile Quick Screen w PCR reflex     Status: None   Collection Time: 08/06/20 10:40 PM   Specimen: Stool  Result Value Ref Range Status   C Diff antigen NEGATIVE NEGATIVE Final   C Diff toxin NEGATIVE NEGATIVE Final   C Diff interpretation No C. difficile detected.  Final     Comment: Performed at [facility], [address], [place], [place] [address]    Radiology Studies: No results found.  Scheduled Meds: . aspirin  81 mg Oral Daily  . Chlorhexidine Gluconate Cloth  6 each Topical Daily  . dextromethorphan-guaiFENesin  1 tablet Oral Daily  . diltiazem  30 mg Oral Q6H  . donepezil  10 mg Oral Daily  . enoxaparin (LOVENOX) injection  60 mg Subcutaneous Q12H  . fluconazole  200 mg Oral Daily  . hydrocortisone  10 mg Oral Daily  . insulin aspart  0-5 Units Subcutaneous QHS  . insulin aspart  0-9 Units Subcutaneous TID WC  . mouth rinse  15 mL Mouth Rinse BID  . mirtazapine  15 mg Oral QHS  . pantoprazole sodium  40 mg Per Tube Daily   Continuous Infusions: . ceFEPime (MAXIPIME) IV 2 g (08/10/20 0832)  . metronidazole 500 mg (08/10/20 0835)    LOS: 5 days   Roxan Hockey, MD How to contact the [facility] Attending or Consulting provider [place] or covering provider during after hours [place], for this patient?  1. Check the care team in [facility] and look for a) attending/consulting TRH provider listed and b) the [facility] team listed 2. Log into [email or web address] and use [place]'s universal password to access. If you do not have the password, please contact the hospital operator. 3. Locate the [facility] provider you are looking for under [facility] and page to a number that you can be directly reached. 4. If you still have difficulty reaching the provider, please page the [facility] (Director on Call) for the Hospitalists listed on amion for assistance.  08/10/2020, 12:47 PM

## 2020-08-10 NOTE — Progress Notes (Signed)
Speech Language Pathology Treatment: Dysphagia  Patient Details Name: [name] MRN: [id number] DOB: April 03, 1924 Today's Date: 08/10/2020 Time: 0935-1010 SLP Time Calculation (min) (ACUTE ONLY): 35 min  Assessment / Plan / Recommendation Clinical Impression  Pt seen for dysphagia intervention following EGD completed on Thursday. Pt had suspected aspiration event yesterday while being fed by family. Pt tells SLP repeatedly today that he is "ready to give up" and wants "to go home". He stated that he is 87 years old and he's "had enough". Pt's daughter present and indicates that this is a dramatic shift in his previous wants. Pt consumed thin, NTL, and puree in limited amounts. He states that he is not hungry or thirsty, but is willing to take po if we want him to take some. Pt presents with occasional wet vocal quality and he benefited from cue to clear his throat and repeat swallow to clear. Pt is at risk for aspiration given deconditioning, delay in swallow initiation (previous MBSS), and esophageal dysphagia. Family is now considering taking Pt home with hospice. SLP will follow per goals of care. Continue diet as ordered and allow Pt to guide his intake. Above discussed with daughter, Dr. Denton Brick, and LPN.    HPI HPI: [name] is a 87 y.o. 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 Delton Coombes, history of dysphagia with dilations in the past and most recently March 2020, presenting to the ED with shortness of breath and fever, confusion, weakness, choking after eating mandarin oranges with worsening coughing yesterday. Admitted with sepsis secondary to pneumonia, dry gangrene of right great toe, afib with RVR, acute metabolic encephalopathy in setting of acute illness,  with GI now consulted due to history of dysphagia and esophageal stricture. Daughter states that since March 2022 after ED visit in [place] with choking episode, that he has been eating pudding, ice cream, and applesauce without difficulty until yesterday when he ate canned mandarin oranges. He has been evaluated already by Dr. Jenetta Downer as outpatient with BPE April 2022 showing subjective narrowing of distal esophagus, 12.5 mm tablet lodged in distal esophageal diverticulum, retaining a large amount of contrast with subsequent reflux of contrast to level of aortic arch. Also second developing small distal esophageal diverticulum. Laryngeal penetration without aspiration although vallecular, piriform sinus, and oral residuals noted. Speech therapy recommended. Plans for EGD with  possible dilation and Botox injection as outpatient for May 2022. Prior EGDs in 2020 and 2017 as noted below. CT chest in March with dilated esophagus, air-filled proximally, containing dependent intraluminal contents in mid and distal aspect, mild irregular distal esophageal wall thickening, suspected chronic dysmotility. BSE requested.      SLP Plan  Continue with current plan of care       Recommendations  Diet recommendations: Dysphagia 1 (puree);Thin liquid Liquids provided via: Cup;Straw Medication Administration: Crushed with puree Supervision: Full supervision/cueing for compensatory strategies;Staff to assist with self feeding Compensations: Slow rate;Small sips/bites;Multiple dry swallows after each bite/sip Postural Changes and/or Swallow Maneuvers: Seated upright 90 degrees;Upright 30-60 min after meal                Oral Care Recommendations: Oral care BID;Staff/trained caregiver to provide oral care Follow up Recommendations: 24 hour supervision/assistance SLP Visit Diagnosis: Dysphagia, unspecified (R13.10) Plan: Continue with current plan of care  Thank you,  Genene Churn,  [place]                 [place] 08/10/2020, 10:51 AM

## 2020-08-10 NOTE — Progress Notes (Signed)
Pharmacy Antibiotic Note  [name] is a 87 y.o. male admitted on 08/05/2020 with wound infection and pneumonia.  Pharmacy has been consulted for Cefepime dosing.   Plan: Continue cefepime 2gm IV q12 hours F/u renal function, cultures and clinical course  Height: 5\' 10"  (177.8 cm) Weight: 58.5 kg (128 lb 15.5 oz) IBW/kg (Calculated) : 73  Temp (24hrs), Avg:98.4 F (36.9 C), Min:98.1 F (36.7 C), Max:98.8 F (37.1 C)  Recent Labs  Lab 08/05/20 0015 08/05/20 0445 08/06/20 0414 08/06/20 2144 08/07/20 0409 08/08/20 0615 08/09/20 0520 08/10/20 0513  WBC 19.3* 20.6* 10.1 12.2* 8.9 9.9 9.9 8.6  CREATININE 1.13 1.00 0.97  --  0.96 0.80 0.82 0.73  LATICACIDVEN 1.5 1.2  --   --   --   --   --   --     Estimated Creatinine Clearance: 46.7 mL/min (by C-G formula based on SCr of 0.73 mg/dL).    Allergies  Allergen Reactions  . Tape Other (See Comments)    SKIN IS VERY THIN AND WILL TEAR AND BRUISE VERY EASILY!!!!  . Augmentin [Amoxicillin-Pot Clavulanate] Rash and Other (See Comments)    Redness of skin   Vancomycin 4/18>> 4/18 Cefepime 4/18>> Flagyl 4/18>>  4/18 BCX: ngtd 4/18 UCX: insignificant growth 4/18 MRSA is negative Cdiff neg  Thank you for allowing pharmacy to be a part of this patient's care.  Hart Robinsons A 08/10/2020 1:03 PM

## 2020-08-10 NOTE — Progress Notes (Signed)
Subjective:  According to patient's daughter [name] he is still coughing at mealtime and she feels he is aspirating.  No improvement in swallowing difficulty since EGD with Botox therapy 2 days ago.  Current Medications:  Current Facility-Administered Medications:  .  acetaminophen (TYLENOL) tablet 650 mg, 650 mg, Oral, Q6H PRN, 650 mg at 08/07/20 2313 **OR** acetaminophen (TYLENOL) suppository 650 mg, 650 mg, Rectal, Q6H PRN, Zierle-Ghosh, Asia B, DO .  ALPRAZolam (XANAX) tablet 0.5 mg, 0.5 mg, Oral, QHS PRN, Emokpae, Courage, MD .  aspirin chewable tablet 81 mg, 81 mg, Oral, Daily, Zierle-Ghosh, Asia B, DO, 81 mg at 08/10/20 0830 .  ceFEPIme (MAXIPIME) 2 g in sodium chloride 0.9 % 100 mL IVPB, 2 g, Intravenous, Q12H, Rancour, Stephen, MD, Last Rate: 200 mL/hr at 08/10/20 0832, 2 g at 08/10/20 0832 .  Chlorhexidine Gluconate Cloth 2 % PADS 6 each, 6 each, Topical, Daily, Zierle-Ghosh, Asia B, DO, 6 each at 08/09/20 0915 .  dextromethorphan-guaiFENesin (MUCINEX DM) 30-600 MG per 12 hr tablet 1 tablet, 1 tablet, Oral, Daily, Zierle-Ghosh, Asia B, DO, 1 tablet at 08/10/20 0830 .  diltiazem (CARDIZEM) tablet 30 mg, 30 mg, Oral, Q6H, Johnson, Clanford L, MD, 30 mg at 08/10/20 1247 .  donepezil (ARICEPT) tablet 10 mg, 10 mg, Oral, Daily, Zierle-Ghosh, Asia B, DO, 10 mg at 08/10/20 0831 .  enoxaparin (LOVENOX) injection 60 mg, 60 mg, Subcutaneous, Q12H, Emokpae, Courage, MD, 60 mg at 08/10/20 0627 .  fluconazole (DIFLUCAN) 40 MG/ML suspension 200 mg, 200 mg, Oral, Daily, Gill, Eric A, NP .  hydrocortisone (CORTEF) tablet 10 mg, 10 mg, Oral, Daily, Emokpae, Courage, MD, 10 mg at 08/10/20 1246 .  insulin aspart (novoLOG) injection 0-5 Units, 0-5 Units, Subcutaneous, QHS, Zierle-Ghosh, Asia B, DO, 2 Units at 08/09/20 2214 .  insulin aspart (novoLOG) injection 0-9 Units, 0-9 Units, Subcutaneous, TID WC, Zierle-Ghosh, Asia B, DO, 5 Units at 08/09/20 1722 .  MEDLINE mouth rinse, 15 mL, Mouth Rinse, BID,  Zierle-Ghosh, Asia B, DO, 15 mL at 08/10/20 0838 .  metroNIDAZOLE (FLAGYL) IVPB 500 mg, 500 mg, Intravenous, Q8H, Johnson, Clanford L, MD, Last Rate: 100 mL/hr at 08/10/20 0835, 500 mg at 08/10/20 0835 .  mirtazapine (REMERON) tablet 15 mg, 15 mg, Oral, QHS, Zierle-Ghosh, Asia B, DO, 15 mg at 08/09/20 2210 .  ondansetron (ZOFRAN) tablet 4 mg, 4 mg, Oral, Q6H PRN **OR** ondansetron (ZOFRAN) injection 4 mg, 4 mg, Intravenous, Q6H PRN, Zierle-Ghosh, Asia B, DO .  pantoprazole sodium (PROTONIX) 40 mg/20 mL oral suspension 40 mg, 40 mg, Per Tube, Daily, Johnson, Clanford L, MD, 40 mg at 08/10/20 0831   Objective: Blood pressure (!) 157/101, pulse 69, temperature 98.1 F (36.7 C), temperature source Oral, resp. rate 16, height $RemoveBe'5\' 10"'[id number]$  (1.778 m), weight 58.5 kg, SpO2 94 %.  Patient is sleeping in reclining chair. I did not attempt to wake him up.  Labs/studies Results:  CBC Latest Ref Rng & Units 08/10/2020 08/09/2020 08/08/2020  WBC 4.0 - 10.5 K/uL 8.6 9.9 9.9  Hemoglobin 13.0 - 17.0 g/dL 9.8(L) 9.8(L) 9.0(L)  Hematocrit 39.0 - 52.0 % 29.4(L) 29.8(L) 27.8(L)  Platelets 150 - 400 K/uL 213 241 253    CMP Latest Ref Rng & Units 08/10/2020 08/09/2020 08/08/2020  Glucose 70 - 99 mg/dL 99 153(H) 140(H)  BUN 8 - 23 mg/dL 20 25(H) 29(H)  Creatinine 0.61 - 1.24 mg/dL 0.73 0.82 0.80  Sodium 135 - 145 mmol/L 139 138 137  Potassium 3.5 - 5.1 mmol/L 3.3(L) 3.7 3.4(L)  Chloride 98 - 111 mmol/L 114(H) 114(H) 114(H)  CO2 22 - 32 mmol/L 19(L) 18(L) 18(L)  Calcium 8.9 - 10.3 mg/dL 7.4(L) 7.6(L) 7.7(L)  Total Protein 6.5 - 8.1 g/dL 4.9(L) 5.0(L) 5.0(L)  Total Bilirubin 0.3 - 1.2 mg/dL 0.4 0.5 0.5  Alkaline Phos 38 - 126 U/L 52 53 57  AST 15 - 41 U/L 16 17 13(L)  ALT 0 - 44 U/L 13 13 11     Hepatic Function Latest Ref Rng & Units 08/10/2020 08/09/2020 08/08/2020  Total Protein 6.5 - 8.1 g/dL 4.9(L) 5.0(L) 5.0(L)  Albumin 3.5 - 5.0 g/dL 2.1(L) 2.1(L) 2.1(L)  AST 15 - 41 U/L 16 17 13(L)  ALT 0 - 44 U/L 13 13 11    Alk Phosphatase 38 - 126 U/L 52 53 57  Total Bilirubin 0.3 - 1.2 mg/dL 0.4 0.5 0.5  Bilirubin, Direct 0.0 - 0.2 mg/dL - - -     Assessment:  #1.  Esophageal dysphagia primarily due to esophageal motility disorder possibly made worse by pill esophagitis secondary to doxycycline.  Status post EGD with Botox injection to spastic therapy distal to pill esophagitis 2 days ago.  It has not provided any relief.  Pill esophagitis should heal in a matter few weeks but remains to be seen if dysphagia will improve. Recent EGD also revealed mild Candida esophagitis which I feel is an incidental finding. Patient was also seen by Ms. Dabney Porter,CCC-SLP earlier today.  She has provided dietary instructions to patient's daughter [name].  Patient unfortunately is not a candidate for surgical intervention.  Only other option would be placing gastrostomy tube with patient does not want to have it placed and I would agree with that. Patient referred to hospice.  #2.  Anemia of chronic disease.  #3.  Recurrent aspiration due to esophageal dysmotility disorder.  #4.  Peripheral vascular disease and known dry gangrene of right toe.  Recommendations  I agree patient's diet should be limited to pured diet and he should only take small boluses I also recommended that he should drink liquids first wait a while and then try to swallow food.Marland Kitchen

## 2020-08-10 NOTE — Progress Notes (Signed)
Physical Therapy Treatment Patient Details Name: [name] MRN: [id number] DOB: 1924/01/11 Today's Date: 08/10/2020    History of Present Illness 87 y.o. male with history of peripheral artery disease, metastatic cancer, GERD, dementia, atrial fibrillation, diabetes mellitus type 2, and more presents the ED with a chief complaint of cough and fever.  Patient is confused and not able to provide any history at all.  Daughter at bedside reports that patient has not been feeling well for the past 2 days.  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  They have been treating him since November and expecting autoamputation. Pt has had increasing weakness, increasing difficulty with swallow function, esophageal structure and admitted to [facility] with fever, sepsis, aspiration pneumonia, dysphagia, cellulitis around gangrenous right toe.    PT Comments    Patient presents with his daughter present in room and she assisted with communication due to patient very [place].  Patient demonstrates slow labored movement for sitting up at bedside, fair sitting balance without leaning/falling over, very unsteady on feet and limited to a few side steps due to fall risk/poor standing during transfers to [facility] and chair.  Patient's daughter observed on assisting with transfers and taking steps with RW with understanding acknowledged.  Patient will benefit from continued physical therapy in hospital and recommended venue below to increase strength, balance, endurance for safe ADLs and gait.    Follow Up Recommendations  Home health PT;Supervision for mobility/OOB;Supervision/Assistance - 24 hour     Equipment Recommendations  None recommended by PT    Recommendations for Other Services       Precautions / Restrictions Precautions Precautions: Fall Restrictions Weight Bearing Restrictions: No    Mobility  Bed Mobility Overal bed mobility: Needs  Assistance Bed Mobility: Supine to Sit     Supine to sit: Mod assist          Transfers Overall transfer level: Needs assistance Equipment used: Rolling walker (2 wheeled) Transfers: Sit to/from [facility] Sit to Stand: Mod assist Stand pivot transfers: Mod assist       General transfer comment: slow labored movement with poor carryover for proper hand placement due to very [facility]  Ambulation/Gait Ambulation/Gait assistance: Mod assist;Max assist Gait Distance (Feet): 3 Feet Assistive device: Rolling walker (2 wheeled) Gait Pattern/deviations: Decreased step length - right;Decreased step length - left;Decreased stride length;Trunk flexed Gait velocity: slow   General Gait Details: limited to a few steps due to poor standing balance, generalized weakness and diffiuclty extending trunk   Stairs             Wheelchair Mobility    Modified Rankin (Stroke Patients Only)       Balance Overall balance assessment: Needs assistance Sitting-balance support: Feet supported;No upper extremity supported Sitting balance-Leahy Scale: Fair Sitting balance - Comments: seated at EOB   Standing balance support: During functional activity;Bilateral upper extremity supported Standing balance-Leahy Scale: Poor Standing balance comment: using RW                            Cognition Arousal/Alertness: Awake/alert Behavior During Therapy: WFL for tasks assessed/performed Overall Cognitive Status: History of cognitive impairments - at baseline  Exercises      General Comments        Pertinent Vitals/Pain Pain Assessment: No/denies pain    Home Living                      Prior Function            PT Goals (current goals can now be found in the care plan section) Acute Rehab PT Goals Patient Stated Goal: Return home PT Goal Formulation: With patient/family Time For Goal  Achievement: 08/22/20 Potential to Achieve Goals: Good Progress towards PT goals: Progressing toward goals    Frequency    Min 3X/week      PT Plan Current plan remains appropriate    Co-evaluation              AM-PAC PT "6 Clicks" Mobility   Outcome Measure  Help needed turning from your back to your side while in a flat bed without using bedrails?: A Lot Help needed moving from lying on your back to sitting on the side of a flat bed without using bedrails?: A Lot Help needed moving to and from a bed to a chair (including a wheelchair)?: A Lot Help needed standing up from a chair using your arms (e.g., wheelchair or bedside chair)?: A Lot Help needed to walk in hospital room?: A Lot Help needed climbing 3-5 steps with a railing? : Total 6 Click Score: 11    End of Session   Activity Tolerance: Patient tolerated treatment well;Patient limited by fatigue Patient left: in chair;with call bell/phone within reach Nurse Communication: Mobility status PT Visit Diagnosis: Unsteadiness on feet (R26.81);Other abnormalities of gait and mobility (R26.89);Muscle weakness (generalized) (M62.81)     Time: 8115-7262 PT Time Calculation (min) (ACUTE ONLY): 36 min  Charges:  $Therapeutic Activity: 23-37 mins                     11:56 AM, 08/10/20 [name], MPT Physical Therapist with [facility] [phone] office [phone] mobile phone

## 2020-08-10 NOTE — Progress Notes (Signed)
[facility])  Referral for hospice at home received from [facility].  Spoke with daughter [name] who shes that her dad is currently a palliative pt with [facility] and is in the process of transitioning onto hospice care with them.  Contact information given to [facility] with encouragement to call [facility] if plans should change.  Care team made aware via secure chat.  Thank you for the opportunity to participate in this pt's care.  Domenic Moras, BSN, RN [facility] Liaison [phone] (24h on call)

## 2020-08-11 LAB — GLUCOSE, CAPILLARY: Glucose-Capillary: 143 mg/dL — ABNORMAL HIGH (ref 70–99)

## 2020-08-11 MED ORDER — DILTIAZEM HCL 30 MG PO TABS: 30.0000 mg | ORAL_TABLET | Freq: Four times a day (QID) | ORAL | 0 refills | Status: AC | PRN

## 2020-08-11 MED ORDER — MORPHINE SULFATE 20 MG/5ML PO SOLN: 2.5000 mg | ORAL | 0 refills | Status: AC | PRN

## 2020-08-11 MED ORDER — OMEPRAZOLE 2 MG/ML ORAL SUSPENSION: 20.0000 mg | Freq: Every day | ORAL | 1 refills | Status: AC

## 2020-08-11 MED ORDER — ALPRAZOLAM 0.5 MG PO TABS: 0.5000 mg | ORAL_TABLET | Freq: Two times a day (BID) | ORAL | 0 refills | Status: AC | PRN

## 2020-08-11 MED ORDER — ONDANSETRON 4 MG PO TBDP: 4.0000 mg | ORAL_TABLET | Freq: Three times a day (TID) | ORAL | 0 refills | Status: AC | PRN

## 2020-08-11 NOTE — Discharge Summary (Signed)
[name], is a 87 y.o. male  DOB [birth year]  MRN [id number].  Admission date:  08/05/2020  Admitting Physician  Rolla Plate, DO  Discharge Date:  08/11/2020   Primary MD  [name], NP  Recommendations for primary care physician for things to follow:   -Discharge home with comfort care and hospice services -Comfort food as desired   Admission Diagnosis  Sepsis ([place]) [A41.9] Sepsis with acute hypoxic respiratory failure without septic shock, due to unspecified organism ([place]) [A41.9, R65.20, J96.01]   Discharge Diagnosis  Sepsis ([place]) [A41.9] Sepsis with acute hypoxic respiratory failure without septic shock, due to unspecified organism ([place]) [A41.9, R65.20, J96.01]    Principal Problem:   Sepsis ([place]) Active Problems:   Esophageal diverticulum   Esophageal achalasia- s/p botox injection on 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 Acute bronchitis   . Acute lower UTI 11/25/2017  . Amputation of toe of left foot ([place]) 07/30/2017, 08/27/2017    Great toe 07/30/2017 Second toe 08/27/2017   . Arthritis   . Aspiration pneumonia ([place]) 01/31/2018  . Atrial fibrillation ([place])    Dr. Harl Bowie- [facility] follows saw 11'14  . Bladder stones    tx. with oral meds and antibiotics, now surgery planned  . Bone metastases ([place]) 08/21/2015  . Cataracts, both eyes    surgery planned May 2015  . Community acquired pneumonia 04/16/2018  . Cystoid macular edema 04/21/2015    Right eye 04/21/2015   . Dehydration 04/16/2018  . Dementia without behavioral disturbance ([place]) 04/16/2018  . Diabetes mellitus without complication ([place])    Type II  . Diarrhea 11/25/2017  . Dyspnea    with activity  .  Dysrhythmia    Afib  . Family history of breast cancer   . Family history of colon cancer   . Genetic testing 02/09/2017  . GERD (gastroesophageal reflux disease)   . 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 Margot Chimes, thus far without recurrence.   Marland Kitchen History of MRI of lumbar spine 03/05/2014  . Hypertension   . Hypertrophy of prostate with urinary obstruction   . Hypomagnesemia 03/16/2017  . Metabolic encephalopathy 58/31/6742  . Metastatic adenocarcinoma to prostate ([place]) 09/08/2013  . 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 NHL (non-Hodgkin's lymphoma) ([place]) 2005   Diffuse large B-cell lymphoma, clinically stage IIIA, CD20 positive, status post cervical lymph node biopsy 08/03/2003 on the left. PET scan was also positive in the spleen and small bowel region, but bone marrow aspiration and biopsy were negative. So he essentially had stage IIIAs. He  received R-CHOP x6 cycles with CR established by PET scan criteria on 11/23/2003 with no evidence for relapse th  . Numbness 02/19/2017   Per [place] new patient packet  . Osteomyelitis ([place]) 06/29/2017   toe of left foot   . PAD (peripheral artery disease) ([place]) 08/27/2017  . Polyneuropathy 02/19/2017  . Postural dizziness with presyncope 02/19/2017  . S/P TURP 08/10/2013  . Sepsis ([place]) 07/02/2018  . Skin cancer    Basal cell- face,head, neck,  arms, legs, Back  . Urinary frequency 02/19/2017    Past Surgical History:  Procedure Laterality Date  . ABDOMINAL AORTOGRAM W/LOWER EXTREMITY N/A 09/20/2019   Procedure: ABDOMINAL AORTOGRAM W/LOWER EXTREMITY;  Surgeon: Marty Heck, MD;  Location: [facility];  Service: Cardiovascular;  Laterality: N/A;  . AMPUTATION Left 06/29/2017    Procedure: AMPUTATION LEFT GREAT TOE;  Surgeon: [name], MD;  Location: [facility];  Service: Vascular;  Laterality: Left;  . AMPUTATION Left 08/27/2017   Procedure: AMPUTATION Left SECOND TOE;  Surgeon: [name], MD;  Location: [facility];  Service: Vascular;  Laterality: Left;  . BOTOX INJECTION N/A 08/08/2020   Procedure: BOTOX INJECTION;  Surgeon: [name], MD;  [facility];  Service: Endoscopy;  Laterality: N/A;  . CATARACT EXTRACTION Bilateral 2015  . CHOLECYSTECTOMY    . COLON RESECTION     small bowel  . CYSTOSCOPY WITH LITHOLAPAXY N/A 08/10/2013   Procedure: CYSTOSCOPY WITH LITHOLAPAXY WITH [name];  Surgeon: [name], MD;  [facility];  Service: Urology;  Laterality: N/A;  . ESOPHAGEAL DILATION N/A 02/27/2016   Procedure: ESOPHAGEAL DILATION;  Surgeon: [name], MD;  [facility];  Service: Endoscopy;  Laterality: N/A;  . ESOPHAGEAL DILATION N/A 07/07/2018   Procedure: ESOPHAGEAL DILATION;  Surgeon: [name], MD;  [facility];  Service: Endoscopy;  Laterality: N/A;  . ESOPHAGOGASTRODUODENOSCOPY N/A 02/27/2016   web in proximal esophagus s/p dilation, small diverticulum in lower third of esophagus with food debris, benign-appearing esophageal stenosis distal to diverticulum dilated by passing 49 F dilator, mild Schatzki ring s/p dilation, 3 cm hiatal hernia, portal gastropathy, few gastric polyps, normal duodenum.   . ESOPHAGOGASTRODUODENOSCOPY N/A 07/07/2018   abnormal esophageal motility, non-critical Schatzki's ring s/p dilation with balloon but still intact, therefore disrupted with focal biopsy at 3 points, 4 cm sliding hiatal hernia, normal stomach and duodenum.   . ESOPHAGOGASTRODUODENOSCOPY (EGD) WITH PROPOFOL N/A 08/08/2020   Procedure: ESOPHAGOGASTRODUODENOSCOPY (EGD) WITH PROPOFOL;  Surgeon: [name], MD;  [facility];  Service: Endoscopy;  Laterality: N/A;  . INCISION AND  DRAINAGE PERIRECTAL ABSCESS    . LOWER EXTREMITY ANGIOGRAPHY N/A 06/25/2017   Procedure: LOWER EXTREMITY ANGIOGRAPHY;  Surgeon: [name], MD;  Location: [facility];  Service: Cardiovascular;  Laterality: N/A;  . LYMPH NODE DISSECTION Left    '05-neck  . PERIPHERAL VASCULAR ATHERECTOMY Right 09/20/2019   Procedure: PERIPHERAL VASCULAR ATHERECTOMY;  Surgeon: Marty Heck, MD;  Location: [facility];  Service: Cardiovascular;  Laterality: Right;  Posterior tibial  . PORT-A-CATH REMOVAL    . PORTACATH PLACEMENT     insertion and removal -last chemotherapy 10 yrs ago  . TRANSURETHRAL RESECTION OF PROSTATE N/A 08/10/2013   Procedure: TRANSURETHRAL RESECTION OF THE PROSTATE WITH GYRUS INSTRUMENTS;  Surgeon: [name], MD;  [facility];  Service: Urology;  Laterality: N/A;     HPI  from the history and physical  done on the day of admission:   [name]  is a 87 y.o. male, with history of peripheral artery disease, metastatic cancer, GERD, dementia, atrial fibrillation, diabetes mellitus type 2, and more presents the ED with a chief complaint of cough and fever.  Patient is confused and not able to provide any history at all.  Daughter at bedside reports that patient has not been feeling well for the past 2 days.  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  They have been treating him since November and expecting autoamputation.  They had followed with vascular surgery who reported that his limited circulation would only be able to support an AKA, no lower amputations on that extremity, which patient did not want to proceed with.  In November he was started on doxycycline, became septic and was changed to clindamycin.  The toe was doing pretty well, but then the area became erythematous and edematous at the end of March.  He was started on clindamycin at that time, and then switched to Doxy 3  days ago.  Daughter reports the patient was not feeling well but does not go into a great deal of explanation.  She is reports has had progressive generalized weakness over the last couple of days.  He has had trouble with dysphagia and has home speech therapy, but today he aspirated when eating canned mandrin oranges.  He had a cough throughout the day that became worse.  Patient then became very chilled with rigors.  Daughter at bedside reports that temperature was as high as 101 at home.  He was confused more so than at his baseline.  He said he had to go to the bathroom and 3 women tried to help him transfer from his chair to the walker and they were unable to do it so they knew that he was more than they can take care of at home that he needed to come into the ED.  Of note he was recently diagnosed on March 16 with an F esophageal diverticulum.  He is scheduled for a endoscopy May 6 for esophageal stretching.  On his previous swallow study he failed at all stages of swallowing per daughter's report.  Patient is vaccinated for COVID.  He is DNR and they do not want any invasive measures including no central line however they would be okay with pressors if needed.  In the ED Temp 103.7, heart rate 92-1 20, respiratory rate 27-38, blood pressure 93/65-122/99 maintaining oxygen sats on 3 L nasal cannula White blood cell count 19.3, hemoglobin 8.5 Chemistry panel reveals a BUN to creatinine ratio of 27:1 0.13 -Baseline creatinine seems to be 0.9 Hyperglycemic at 188 COVID test pending Blood culture pending Chest x-ray shows lower lobe airspace opacity concerning for pneumonia X-ray right foot shows no acute bony abnormality EKG shows a heart rate of 131 A. fib with RVR QTC 489 Patient was started on cefepime, bank, Flagyl Lactic acid and Trope pending Tylenol and 2 L fluid given in the ED Continuing fluid at 150 mL/h      Hospital Course:    Brief Admission History:  87 y.o.malewith  history of peripheral artery disease, metastatic cancer, GERD, dementia, atrial fibrillation, diabetes mellitus type 2, and more presents the ED with a chief complaint of cough and fever. Patient is confused and not able to provide any history at all. Daughter at bedside reports that patient  has not been feeling well for the past 2 days. 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 They have been treating him since November and expecting autoamputation. Pt has had increasing weakness, increasing difficulty with swallow function, esophageal structure and admitted to [facility] with fever, sepsis, aspiration pneumonia, dysphagia, cellulitis around gangrenous right toe.   Assessment & Plan:   Principal Problem:   Sepsis ([place]) Active Problems:   Esophageal diverticulum   Esophageal achalasia- s/p botox injection on 07/30/20   Candida esophagitis (HCC)   ATRIAL FIBRILLATION   Dysphagia   Type 2 diabetes mellitus (HCC)   Gangrene of toe of right foot (HCC)   Protein-calorie malnutrition, severe  A/p 1)Sepsis secondary to Aspiration Pneumonia -  -Treated with  cefepime and Flagyl Treated with stress dose steroids-- -Unfortunately continues to have episodes of likely aspiration =-Patient and family requested transition to full comfort care and discharge home with hospice services  2)Dysphagia--- --Unfortunately continues to have episodes of likely aspiration--- -Reevaluated on 08/10/20 by speech pathologist -recommends dysphagia 1 (puree);Thin liquid -GI consult appreciated  -Status post EGD with dilatation and Botox injections on 08/08/2020, -EGD with spasms, esophagitis and gastritis concerns for Candida esophagitis -Treated with Mycostatin and PPI -=-Patient and family requested transition to full comfort care and discharge home with hospice services  3)DM2-A1c is 5.6 reflecting excellent diabetic control PTA,--hold  metformin and glipizide to avoid hypoglycemia as oral intake is poor due to swallowing difficulties -=-Patient and family requested transition to full comfort care and discharge home with hospice services   4)Severe protein calorie malnutrition---partly due to swallowing difficulties/dysphagia - appreciate dietitian consultation and recommendations -=-Patient and family requested transition to full comfort care and discharge home with hospice services  5)Atrial Fibrillation /PAD -Treated with Lovenox bridge  We will discontinue apixaban -May use Cardizem for comfort as needed palpitations/RVR -=-Patient and family requested transition to full comfort care and discharge home with hospice services - 6)Dry Gangrene Right Great Toe - appreciate surgery consult -Anticipate auto amputation of right big toe   7)Social/Ethics--- remains DNR, palliative care consult appreciated -Patient and family requested de-escalation of care and transitioning to comfort care with hospice involvement  8)Normocytic Anemia -there is no evidence of active bleeding -Serum iron is not low, ferritin is not low -Patient received 2 units of PRBC -Hemoglobin 9.8 from a low of 5.8 -Folate WNL -B12 was low--replaced  9)Hypokalemia / Hypomagnesemia -Replaced  10)Generalized weakness/deconditioning/ambulatory dysfunction--- --- Physical therapy evaluation appreciated recommends home health physical therapy -=-Patient and family requested transition to full comfort care and discharge home with hospice services   Code Status: DNR  Family Communication: daughter [name] at bedside updated  Disposition: - discharge home with hospice services    Dispo: The patient is from: Home  A- discharge home with hospice services  ---Unfortunately continues to have episodes of likely aspiration    =-Patient and family requested transition to full comfort care and discharge home with hospice services    Consultants:   GI  Palliative care  surgery  Procedures:   EGD with dilatation and Botox on 08/08/2020  Antimicrobials:  Cefepime 4/17>> Vancomycin 4/17-4/18 Metronidazole 4/18>>    Discharge Condition: Overall prognosis is poor  Follow UP --hospice team  Diet and Activity recommendation:  As advised  Discharge Instructions    Discharge Instructions    Call MD for:  difficulty breathing, headache or visual disturbances   Complete by:  As directed    Call MD for:  persistant dizziness or light-headedness   Complete by: As directed    Call MD for:  persistant nausea and vomiting   Complete by: As directed    Call MD for:  severe uncontrolled pain   Complete by: As directed    Call MD for:  temperature >100.4   Complete by: As directed    Diet general   Complete by: As directed    Comfort food as desired   Discharge instructions   Complete by: As directed    -Discharge home with comfort care and hospice services -Comfort food as desired   Discharge wound care:   Complete by: As directed    As previously instructed   Increase activity slowly   Complete by: As directed        Discharge Medications     Allergies as of 08/11/2020      Reactions   Tape Other (See Comments)   SKIN IS VERY THIN AND WILL TEAR AND BRUISE VERY EASILY!!!!   Augmentin [amoxicillin-pot Clavulanate] Rash, Other (See Comments)   Redness of skin      Medication List    STOP taking these medications   apixaban 5 MG Tabs tablet Commonly known as: Eliquis   Aspirin 81 MG Caps   blood glucose meter kit and supplies   denosumab 120 MG/1.7ML Soln injection Commonly known as: XGEVA   diltiazem 60 MG 12 hr capsule Commonly known as: CARDIZEM SR Replaced by: diltiazem 30 MG tablet   donepezil 10 MG tablet Commonly known as: ARICEPT   doxycycline 100 MG tablet Commonly known as: VIBRA-TABS   EasyMax Test test strip Generic drug: glucose blood   furosemide 20 MG  tablet Commonly known as: LASIX   glipiZIDE 5 MG 24 hr tablet Commonly known as: GLUCOTROL XL   Lancets 28G Misc   lisinopril 20 MG tablet Commonly known as: ZESTRIL   metFORMIN 850 MG tablet Commonly known as: GLUCOPHAGE   omeprazole 40 MG capsule Commonly known as: PRILOSEC Replaced by: omeprazole 2 mg/mL Susp oral suspension   PROBIOTIC DAILY PO   simethicone 80 MG chewable tablet Commonly known as: MYLICON   Vitamin D3 25 MCG (1000 UT) Caps     TAKE these medications   acetaminophen 500 MG tablet Commonly known as: TYLENOL Take 325-500 mg by mouth See admin instructions. 325mg  in AM and 500mg  PRN QHS   albuterol (2.5 MG/3ML) 0.083% NEBU 3 mL, albuterol (5 MG/ML) 0.5% NEBU 0.5 mL Inhale 2.5 mg into the lungs 3 (three) times daily as needed (shortness of breath).   ALPRAZolam 0.5 MG tablet Commonly known as: XANAX Take 1 tablet (0.5 mg total) by mouth 2 (two) times daily as needed for anxiety or sleep.   diclofenac Sodium 1 % Gel Commonly known as: VOLTAREN Apply 2 g topically as needed (pain).   diltiazem 30 MG tablet Commonly known as: CARDIZEM Take 1 tablet (30 mg total) by mouth every 6 (six) hours as needed (palpitation and  fast Heart Rate with Pulse > 120bpm). Replaces: diltiazem 60 MG 12 hr capsule   mirtazapine 15 MG tablet Commonly known as: REMERON Take 15 mg by mouth at bedtime.   morphine 20 MG/5ML solution Take 0.6 mLs (2.4 mg total) by mouth every 4 (four) hours as needed for pain (anxiety).   omeprazole 2 mg/mL Susp oral suspension Commonly known as: FIRST-Omeprazole Take 10 mLs (20 mg total) by mouth daily. For stomach Replaces: omeprazole  40 MG capsule   ondansetron 4 MG disintegrating tablet Commonly known as: Zofran ODT Take 1 tablet (4 mg total) by mouth every 8 (eight) hours as needed for nausea or vomiting.            Discharge Care Instructions  (From admission, onward)         Start     Ordered   08/11/20 0000   Discharge wound care:       Comments: As previously instructed   08/11/20 1101          Major procedures and Radiology Reports - PLEASE review detailed and final reports for all details, in brief -   DG Chest Port 1 View  Result Date: 08/05/2020 CLINICAL DATA:  Questionable sepsis EXAM: PORTABLE CHEST 1 VIEW COMPARISON:  None. FINDINGS: Cardiomegaly. Left lower lobe airspace opacity, likely pneumonia. No confluent opacity on the right. No effusions. No acute bony abnormality. IMPRESSION: Left lower lobe airspace opacity concerning for pneumonia. Electronically Signed   By: Rolm Baptise M.D.   On: 08/05/2020 00:45   DG Foot 2 Views Right  Result Date: 08/02/2020 CLINICAL DATA:  Cellulitis of right foot. History of osteomyelitis. Amputation of left great toe in April 2019. Second left great toe amputation on 08/27/2017. EXAM: RIGHT FOOT - 2 VIEW COMPARISON:  Right foot 02/22/2020 FINDINGS: No cortical erosion or destruction. No evidence of fracture or dislocation. No evidence of severe arthropathy. No aggressive appearing focal bone abnormality. No subcutaneus soft tissue edema.  Vascular calcifications. IMPRESSION: Subcutaneus soft tissue edema with no radiographic findings to suggest osteomyelitis. Electronically Signed   By: Iven Finn M.D.   On: 08/02/2020 17:12   DG Foot Complete Right  Result Date: 08/05/2020 CLINICAL DATA:  Wound on right heel. EXAM: RIGHT FOOT COMPLETE - 3+ VIEW COMPARISON:  None. FINDINGS: Small plantar calcaneal spur. No bone destruction to suggest osteomyelitis. No acute bony abnormality. Specifically, no fracture, subluxation, or dislocation. Diffuse vascular calcifications. IMPRESSION: No acute bony abnormality. Electronically Signed   By: Rolm Baptise M.D.   On: 08/05/2020 00:45   DG ESOPHAGUS W SINGLE CM (SOL OR THIN BA)  Result Date: 07/29/2020 CLINICAL DATA:  Dysphagia, choking on food, feels like food sticking, history of aspiration pneumonia, GERD,  dilatation in 2020 EXAM: ESOPHOGRAM / BARIUM SWALLOW / BARIUM TABLET STUDY TECHNIQUE: Combined double contrast and single contrast examination performed using effervescent crystals, thick barium liquid, and thin barium liquid. The patient was observed with fluoroscopy swallowing a 13 mm barium sulphate tablet. FLUOROSCOPY TIME:  Fluoroscopy Time:  2 minutes 24 seconds Radiation Exposure Index (if provided by the fluoroscopic device): 37.7 mGy Number of Acquired Spot Images: multiple fluoroscopic screen captures COMPARISON:  None FINDINGS: Patient unable to stand upright for imaging. Examination performed with patient at horizontal an with head of table elevated 45 degrees. Diffuse esophageal dysmotility. Laryngeal penetration without aspiration. Vallecular, piriform sinus and or residuals noted. Subjective narrowing of the distal esophagus just above GE junction. Large distal esophageal diverticulum identified , retaining a significant amount of barium, which then freely reflux is into the mid esophagus as far cranially as the aortic arch despite elevation of head of table 45 degrees. A second small developing distal esophageal diverticulum is seen above the epiphrenic diverticulum. 12.5 mm diameter barium tablet became lodged in the large diverticulum; unable to assess patency/diameter of the distal most thoracic esophagus. IMPRESSION: Subjective narrowing of the distal esophagus. 12.5 mm diameter barium tablet became lodged in a large distal  esophageal diverticulum; this diverticulum retains a large amount of contrast with subsequent reflux of this contrast to the level of the aortic arch. Second developing small distal esophageal diverticulum. Laryngeal penetration without aspiration although vallecular, piriform sinus and oral residuals are noted; question whether these place patient at risk for aspiration. Consider speech therapy evaluation. Electronically Signed   By: Lavonia Dana M.D.   On: 07/29/2020 10:09     Micro Results   Recent Results (from the past 240 hour(s))  Resp Panel by RT-PCR (Flu A&B, Covid) Nasopharyngeal Swab     Status: None   Collection Time: 08/05/20 12:15 AM   Specimen: Nasopharyngeal Swab; Nasopharyngeal(NP) swabs in vial transport medium  Result Value Ref Range Status   SARS Coronavirus 2 by RT PCR NEGATIVE NEGATIVE Final    Comment: (NOTE) SARS-CoV-2 target nucleic acids are NOT DETECTED.  The SARS-CoV-2 RNA is generally detectable in upper respiratory specimens during the acute phase of infection. The lowest concentration of SARS-CoV-2 viral copies this assay can detect is 138 copies/mL. A negative result does not preclude SARS-Cov-2 infection and should not be used as the sole basis for treatment or other patient management decisions. A negative result may occur with  improper specimen collection/handling, submission of specimen other than nasopharyngeal swab, presence of viral mutation(s) within the areas targeted by this assay, and inadequate number of viral copies(<138 copies/mL). A negative result must be combined with clinical observations, patient history, and epidemiological information. The expected result is Negative.  Fact Sheet for Patients:  EntrepreneurPulse.com.au  Fact Sheet for Healthcare Providers:  IncredibleEmployment.be  This test is no t yet approved or cleared by the [place] FDA and  has been authorized for detection and/or diagnosis of SARS-CoV-2 by FDA under an Emergency Use Authorization (EUA). This EUA will remain  in effect (meaning this test can be used) for the duration of the COVID-19 declaration under Section 564(b)(1) of the Act, 21 U.S.C.section 360bbb-3(b)(1), unless the authorization is terminated  or revoked sooner.       Influenza A by PCR NEGATIVE NEGATIVE Final   Influenza B by PCR NEGATIVE NEGATIVE Final    Comment: (NOTE) The Xpert Xpress SARS-CoV-2/FLU/RSV plus assay  is intended as an aid in the diagnosis of influenza from Nasopharyngeal swab specimens and should not be used as a sole basis for treatment. Nasal washings and aspirates are unacceptable for Xpert Xpress SARS-CoV-2/FLU/RSV testing.  Fact Sheet for Patients: EntrepreneurPulse.com.au  Fact Sheet for Healthcare Providers: IncredibleEmployment.be  This test is not yet approved or cleared by the [place] FDA and has been authorized for detection and/or diagnosis of SARS-CoV-2 by FDA under an Emergency Use Authorization (EUA). This EUA will remain in effect (meaning this test can be used) for the duration of the COVID-19 declaration under Section 564(b)(1) of the Act, 21 U.S.C. section 360bbb-3(b)(1), unless the authorization is terminated or revoked.  Performed at [facility], [address] [place], [place] [address]   Blood Culture (routine x 2)     Status: None   Collection Time: 08/05/20 12:15 AM   Specimen: BLOOD RIGHT FOREARM  Result Value Ref Range Status   Specimen Description BLOOD RIGHT FOREARM  Final   Special Requests   Final    BOTTLES DRAWN AEROBIC AND ANAEROBIC Blood Culture adequate volume   Culture   Final    NO GROWTH 5 DAYS Performed at [facility], [address], [place], [place] [address]    Report Status 08/10/2020 FINAL  Final  Blood Culture (routine x 2)     Status: None   Collection Time: 08/05/20 12:20 AM   Specimen: BLOOD LEFT FOREARM  Result Value Ref Range Status   Specimen Description BLOOD LEFT FOREARM  Final   Special Requests   Final    BOTTLES DRAWN AEROBIC AND ANAEROBIC Blood Culture adequate volume   Culture   Final    NO GROWTH 5 DAYS Performed at [facility], [address], [place], [place] [address]    Report Status 08/10/2020 FINAL  Final  MRSA PCR Screening     Status: None   Collection Time: 08/05/20  4:13 AM   Specimen: Nasal Mucosa; Nasopharyngeal  Result Value Ref Range Status   MRSA  by PCR NEGATIVE NEGATIVE Final    Comment:        The GeneXpert MRSA Assay (FDA approved for NASAL specimens only), is one component of a comprehensive MRSA colonization surveillance program. It is not intended to diagnose MRSA infection nor to guide or monitor treatment for MRSA infections. Performed at [facility], [address] [place], [place] [address]   Urine culture     Status: Abnormal   Collection Time: 08/05/20  3:30 PM   Specimen: Urine, Random  Result Value Ref Range Status   Specimen Description   Final    URINE, RANDOM Performed at [facility], [address], [place], [place] [address]    Special Requests   Final    NONE Performed at [facility], [address] [place], [place] [address]    Culture (A)  Final    <10,000 COLONIES/mL INSIGNIFICANT GROWTH Performed at [facility] [address], [place], [place] [address]    Report Status 08/07/2020 FINAL  Final  C Difficile Quick Screen w PCR reflex     Status: None   Collection Time: 08/06/20 10:40 PM   Specimen: Stool  Result Value Ref Range Status   C Diff antigen NEGATIVE NEGATIVE Final   C Diff toxin NEGATIVE NEGATIVE Final   C Diff interpretation No C. difficile detected.  Final    Comment: Performed at [facility], [address] [place], [place] [address]    Today   Subjective    Ajahni Nay today has no new complaints -=-Patient and family requested transition to full comfort care and discharge home with hospice services   Patient has been seen and examined prior to discharge   Objective   Blood pressure (!) 163/81, pulse 76, temperature 97.7 F (36.5 C), temperature source Oral, resp. rate 18, height 5' 10"  (1.778 m), weight 58.5 kg, SpO2 98 %.   Intake/Output Summary (Last 24 hours) at 08/11/2020 1102 Last data filed at 08/11/2020 0800 Gross per 24 hour  Intake 240 ml  Output 600 ml  Net -360 ml    Exam  Gen:- Awake Alert, no acute distress HEENT:- [place].AT, No sclera  icterus, very HOH Neck-Supple Neck,No JVD,.  Lungs-fair air movement, no wheezing CV- S1, S2 normal, irregular Abd-  +ve B.Sounds, Abd Soft, No tenderness,    Extremity/Skin:- No  edema, right great toe gangrenous (dry), left foot with missing great toe and second toe Psych-affect is appropriate, oriented x3 Neuro-generalized weakness, no new focal deficits, no tremors   Data Review   CBC w Diff:  Lab Results  Component Value Date   WBC 8.6 08/10/2020   HGB 9.8 (L) 08/10/2020   HCT 29.4 (L) 08/10/2020   PLT 213 08/10/2020   LYMPHOPCT 26 08/10/2020  BANDSPCT 0 02/08/2018   MONOPCT 17 08/10/2020   EOSPCT 1 08/10/2020   BASOPCT 0 08/10/2020    CMP:  Lab Results  Component Value Date   NA 139 08/10/2020   K 3.3 (L) 08/10/2020   CL 114 (H) 08/10/2020   CO2 19 (L) 08/10/2020   BUN 20 08/10/2020   CREATININE 0.73 08/10/2020   CREATININE 1.14 07/27/2013   PROT 4.9 (L) 08/10/2020   PROT 5.8 (L) 02/19/2017   ALBUMIN 2.1 (L) 08/10/2020   BILITOT 0.4 08/10/2020   ALKPHOS 52 08/10/2020   AST 16 08/10/2020   ALT 13 08/10/2020  . Total Discharge time is about 33 minutes  Roxan Hockey M.D on 08/11/2020 at 11:02 AM  Go to [email or web address] -  for contact info  [facility] - Office  [phone]

## 2020-08-11 NOTE — Discharge Instructions (Signed)
-  Discharge home with comfort care and hospice services -Comfort food as desired

## 2020-08-11 NOTE — Progress Notes (Signed)
EMS present to receive pt. Discharge instructions provided to family member at bedside and provided in discharge packet. Family member called hospice to let them know he is being discharged. No further questions. IV removed without difficulty. Condom cath removed and patient dressed.

## 2020-08-13 ENCOUNTER — Encounter (INDEPENDENT_AMBULATORY_CARE_PROVIDER_SITE_OTHER): Payer: Self-pay

## 2020-08-13 ENCOUNTER — Encounter (HOSPITAL_BASED_OUTPATIENT_CLINIC_OR_DEPARTMENT_OTHER): Payer: Medicare Other | Admitting: Internal Medicine

## 2020-08-21 ENCOUNTER — Other Ambulatory Visit (HOSPITAL_COMMUNITY): Payer: Medicare Other

## 2020-08-23 ENCOUNTER — Ambulatory Visit (HOSPITAL_COMMUNITY): Admit: 2020-08-23 | Payer: Medicare Other | Admitting: Gastroenterology

## 2020-08-23 ENCOUNTER — Encounter (HOSPITAL_COMMUNITY): Payer: Self-pay

## 2020-08-23 SURGERY — ESOPHAGOGASTRODUODENOSCOPY (EGD) WITH PROPOFOL
Anesthesia: Monitor Anesthesia Care

## 2020-09-18 DEATH — deceased

## 2020-10-04 ENCOUNTER — Encounter (HOSPITAL_COMMUNITY): Payer: Self-pay | Admitting: Hematology

## 2020-10-11 ENCOUNTER — Ambulatory Visit: Payer: Medicare Other | Admitting: Nurse Practitioner

## 2020-10-22 ENCOUNTER — Ambulatory Visit (INDEPENDENT_AMBULATORY_CARE_PROVIDER_SITE_OTHER): Payer: Medicare Other | Admitting: Internal Medicine

## 2021-01-22 ENCOUNTER — Encounter (HOSPITAL_COMMUNITY): Payer: Self-pay
